# Patient Record
Sex: Male | Born: 1960 | Race: Black or African American | Hispanic: No | Marital: Married | Smoking: Former smoker
Health system: Southern US, Community
[De-identification: ages and names within clinical notes are randomized; demographics above are authoritative.]

## PROBLEM LIST (undated history)

## (undated) DIAGNOSIS — F1910 Other psychoactive substance abuse, uncomplicated: Secondary | ICD-10-CM

## (undated) DIAGNOSIS — B1920 Unspecified viral hepatitis C without hepatic coma: Secondary | ICD-10-CM

## (undated) DIAGNOSIS — I10 Essential (primary) hypertension: Secondary | ICD-10-CM

## (undated) DIAGNOSIS — C801 Malignant (primary) neoplasm, unspecified: Secondary | ICD-10-CM

## (undated) DIAGNOSIS — I712 Thoracic aortic aneurysm, without rupture, unspecified: Secondary | ICD-10-CM

## (undated) DIAGNOSIS — D631 Anemia in chronic kidney disease: Secondary | ICD-10-CM

## (undated) DIAGNOSIS — R778 Other specified abnormalities of plasma proteins: Secondary | ICD-10-CM

## (undated) DIAGNOSIS — I5031 Acute diastolic (congestive) heart failure: Secondary | ICD-10-CM

## (undated) DIAGNOSIS — N189 Chronic kidney disease, unspecified: Secondary | ICD-10-CM

## (undated) DIAGNOSIS — R7989 Other specified abnormal findings of blood chemistry: Secondary | ICD-10-CM

## (undated) DIAGNOSIS — N400 Enlarged prostate without lower urinary tract symptoms: Secondary | ICD-10-CM

## (undated) HISTORY — DX: Benign prostatic hyperplasia without lower urinary tract symptoms: N40.0

## (undated) HISTORY — DX: Anemia in chronic kidney disease: N18.9

## (undated) HISTORY — PX: ABDOMINAL SURGERY: SHX537

## (undated) HISTORY — DX: Other psychoactive substance abuse, uncomplicated: F19.10

## (undated) HISTORY — DX: Anemia in chronic kidney disease: D63.1

---

## 2012-10-16 DIAGNOSIS — C801 Malignant (primary) neoplasm, unspecified: Secondary | ICD-10-CM

## 2012-10-16 HISTORY — PX: COLON SURGERY: SHX602

## 2012-10-16 HISTORY — DX: Malignant (primary) neoplasm, unspecified: C80.1

## 2015-07-05 ENCOUNTER — Emergency Department (HOSPITAL_COMMUNITY): Payer: Self-pay

## 2015-07-05 ENCOUNTER — Encounter (HOSPITAL_COMMUNITY): Payer: Self-pay | Admitting: Emergency Medicine

## 2015-07-05 ENCOUNTER — Ambulatory Visit: Payer: Self-pay

## 2015-07-05 ENCOUNTER — Emergency Department (HOSPITAL_COMMUNITY)
Admission: EM | Admit: 2015-07-05 | Discharge: 2015-07-05 | Discharge status: Against Medical Advice | Payer: Self-pay | Attending: Emergency Medicine | Admitting: Emergency Medicine

## 2015-07-05 DIAGNOSIS — Z720 Tobacco use: Secondary | ICD-10-CM | POA: Insufficient documentation

## 2015-07-05 DIAGNOSIS — K921 Melena: Secondary | ICD-10-CM | POA: Insufficient documentation

## 2015-07-05 DIAGNOSIS — K644 Residual hemorrhoidal skin tags: Secondary | ICD-10-CM | POA: Insufficient documentation

## 2015-07-05 DIAGNOSIS — I10 Essential (primary) hypertension: Secondary | ICD-10-CM | POA: Insufficient documentation

## 2015-07-05 HISTORY — DX: Essential (primary) hypertension: I10

## 2015-07-05 LAB — COMPREHENSIVE METABOLIC PANEL
ALK PHOS: 78 U/L (ref 38–126)
ALT: 67 U/L — ABNORMAL HIGH (ref 17–63)
ANION GAP: 11 (ref 5–15)
AST: 88 U/L — ABNORMAL HIGH (ref 15–41)
Albumin: 3.9 g/dL (ref 3.5–5.0)
BILIRUBIN TOTAL: 1.3 mg/dL — AB (ref 0.3–1.2)
BUN: 19 mg/dL (ref 6–20)
CALCIUM: 9.2 mg/dL (ref 8.9–10.3)
CO2: 25 mmol/L (ref 22–32)
Chloride: 105 mmol/L (ref 101–111)
Creatinine, Ser: 1.68 mg/dL — ABNORMAL HIGH (ref 0.61–1.24)
GFR, EST AFRICAN AMERICAN: 52 mL/min — AB (ref >60)
GFR, EST NON AFRICAN AMERICAN: 45 mL/min — AB (ref >60)
Glucose, Bld: 98 mg/dL (ref 65–99)
Potassium: 3.7 mmol/L (ref 3.5–5.1)
Sodium: 141 mmol/L (ref 135–145)
TOTAL PROTEIN: 7.7 g/dL (ref 6.5–8.1)

## 2015-07-05 LAB — CBC
HCT: 49.1 % (ref 39.0–52.0)
HEMOGLOBIN: 16.9 g/dL (ref 13.0–17.0)
MCH: 30.2 pg (ref 26.0–34.0)
MCHC: 34.4 g/dL (ref 30.0–36.0)
MCV: 87.7 fL (ref 78.0–100.0)
Platelets: 248 10*3/uL (ref 150–400)
RBC: 5.6 MIL/uL (ref 4.22–5.81)
RDW: 14.2 % (ref 11.5–15.5)
WBC: 7 10*3/uL (ref 4.0–10.5)

## 2015-07-05 LAB — TYPE AND SCREEN
ABO/RH(D): B POS
ANTIBODY SCREEN: NEGATIVE

## 2015-07-05 LAB — POC OCCULT BLOOD, ED: FECAL OCCULT BLD: NEGATIVE

## 2015-07-05 LAB — ABO/RH: ABO/RH(D): B POS

## 2015-07-05 MED ORDER — IOHEXOL 300 MG/ML  SOLN
25.0000 mL | Freq: Once | INTRAMUSCULAR | Status: AC | PRN
  Administered 2015-07-05: 25 mL via ORAL

## 2015-07-05 MED ORDER — LABETALOL HCL 5 MG/ML IV SOLN: 10.0000 mg | Freq: Once | INTRAVENOUS | Status: DC

## 2015-07-05 MED ORDER — HYDRALAZINE HCL 20 MG/ML IJ SOLN
10.0000 mg | Freq: Once | INTRAMUSCULAR | Status: AC
  Administered 2015-07-05: 10 mg via INTRAVENOUS
  Filled 2015-07-05: qty 1

## 2015-07-05 MED ORDER — IOHEXOL 300 MG/ML  SOLN
75.0000 mL | Freq: Once | INTRAMUSCULAR | Status: AC | PRN
  Administered 2015-07-05: 75 mL via INTRAVENOUS

## 2015-07-05 MED ORDER — DILTIAZEM HCL ER COATED BEADS 360 MG PO CP24: 360.0000 mg | ORAL_CAPSULE | Freq: Every day | ORAL | Status: DC

## 2015-07-05 NOTE — ED Notes (Signed)
Pt refusing IV at current time, states "my bp isn't going to come down."

## 2015-07-05 NOTE — Discharge Instructions (Signed)
Hemorrhoids °Hemorrhoids are swollen veins around the rectum or anus. There are two types of hemorrhoids:  °· Internal hemorrhoids. These occur in the veins just inside the rectum. They may poke through to the outside and become irritated and painful. °· External hemorrhoids. These occur in the veins outside the anus and can be felt as a painful swelling or hard lump near the anus. °CAUSES °· Pregnancy.   °· Obesity.   °· Constipation or diarrhea.   °· Straining to have a bowel movement.   °· Sitting for long periods on the toilet. °· Heavy lifting or other activity that caused you to strain. °· Anal intercourse. °SYMPTOMS  °· Pain.   °· Anal itching or irritation.   °· Rectal bleeding.   °· Fecal leakage.   °· Anal swelling.   °· One or more lumps around the anus.   °DIAGNOSIS  °Your caregiver may be able to diagnose hemorrhoids by visual examination. Other examinations or tests that may be performed include:  °· Examination of the rectal area with a gloved hand (digital rectal exam).   °· Examination of anal canal using a small tube (scope).   °· A blood test if you have lost a significant amount of blood. °· A test to look inside the colon (sigmoidoscopy or colonoscopy). °TREATMENT °Most hemorrhoids can be treated at home. However, if symptoms do not seem to be getting better or if you have a lot of rectal bleeding, your caregiver may perform a procedure to help make the hemorrhoids get smaller or remove them completely. Possible treatments include:  °· Placing a rubber band at the base of the hemorrhoid to cut off the circulation (rubber band ligation).   °· Injecting a chemical to shrink the hemorrhoid (sclerotherapy).   °· Using a tool to burn the hemorrhoid (infrared light therapy).   °· Surgically removing the hemorrhoid (hemorrhoidectomy).   °· Stapling the hemorrhoid to block blood flow to the tissue (hemorrhoid stapling).   °HOME CARE INSTRUCTIONS  °· Eat foods with fiber, such as whole grains, beans,  nuts, fruits, and vegetables. Ask your doctor about taking products with added fiber in them (fiber supplements). °· Increase fluid intake. Drink enough water and fluids to keep your urine clear or pale yellow.   °· Exercise regularly.   °· Go to the bathroom when you have the urge to have a bowel movement. Do not wait.   °· Avoid straining to have bowel movements.   °· Keep the anal area dry and clean. Use wet toilet paper or moist towelettes after a bowel movement.   °· Medicated creams and suppositories may be used or applied as directed.   °· Only take over-the-counter or prescription medicines as directed by your caregiver.   °· Take warm sitz baths for 15-20 minutes, 3-4 times a day to ease pain and discomfort.   °· Place ice packs on the hemorrhoids if they are tender and swollen. Using ice packs between sitz baths may be helpful.   °¨ Put ice in a plastic bag.   °¨ Place a towel between your skin and the bag.   °¨ Leave the ice on for 15-20 minutes, 3-4 times a day.   °· Do not use a donut-shaped pillow or sit on the toilet for long periods. This increases blood pooling and pain.   °SEEK MEDICAL CARE IF: °· You have increasing pain and swelling that is not controlled by treatment or medicine. °· You have uncontrolled bleeding. °· You have difficulty or you are unable to have a bowel movement. °· You have pain or inflammation outside the area of the hemorrhoids. °MAKE SURE YOU: °· Understand these instructions. °·   Will watch your condition.  Will get help right away if you are not doing well or get worse. Document Released: 09/29/2000 Document Revised: 09/18/2012 Document Reviewed: 08/06/2012 [facility] Patient Information 2015 [place], [place]. This information is not intended to replace advice given to you by your health care provider. Make sure you discuss any questions you have with your health care provider. Hypertension Hypertension, commonly called high blood pressure, is when the force of blood  pumping through your arteries is too strong. Your arteries are the blood vessels that carry blood from your heart throughout your body. A blood pressure reading consists of a higher number over a lower number, such as 110/72. The higher number (systolic) is the pressure inside your arteries when your heart pumps. The lower number (diastolic) is the pressure inside your arteries when your heart relaxes. Ideally you want your blood pressure below 120/80. Hypertension forces your heart to work harder to pump blood. Your arteries may become narrow or stiff. Having hypertension puts you at risk for heart disease, stroke, and other problems.  RISK FACTORS Some risk factors for high blood pressure are controllable. Others are not.  Risk factors you cannot control include:   Race. You may be at higher risk if you are African American.  Age. Risk increases with age.  Gender. Men are at higher risk than women before age 22 years. After age 79, women are at higher risk than men. Risk factors you can control include:  Not getting enough exercise or physical activity.  Being overweight.  Getting too much fat, sugar, calories, or salt in your diet.  Drinking too much alcohol. SIGNS AND SYMPTOMS Hypertension does not usually cause signs or symptoms. Extremely high blood pressure (hypertensive crisis) may cause headache, anxiety, shortness of breath, and nosebleed. DIAGNOSIS  To check if you have hypertension, your health care provider will measure your blood pressure while you are seated, with your arm held at the level of your heart. It should be measured at least twice using the same arm. Certain conditions can cause a difference in blood pressure between your right and left arms. A blood pressure reading that is higher than normal on one occasion does not mean that you need treatment. If one blood pressure reading is high, ask your health care provider about having it checked again. TREATMENT  Treating  high blood pressure includes making lifestyle changes and possibly taking medicine. Living a healthy lifestyle can help lower high blood pressure. You may need to change some of your habits. Lifestyle changes may include:  Following the DASH diet. This diet is high in fruits, vegetables, and whole grains. It is low in salt, red meat, and added sugars.  Getting at least 2 hours of brisk physical activity every week.  Losing weight if necessary.  Not smoking.  Limiting alcoholic beverages.  Learning ways to reduce stress. If lifestyle changes are not enough to get your blood pressure under control, your health care provider may prescribe medicine. You may need to take more than one. Work closely with your health care provider to understand the risks and benefits. HOME CARE INSTRUCTIONS  Have your blood pressure rechecked as directed by your health care provider.   Take medicines only as directed by your health care provider. Follow the directions carefully. Blood pressure medicines must be taken as prescribed. The medicine does not work as well when you skip doses. Skipping doses also puts you at risk for problems.   Do not smoke.  Monitor your blood pressure at home as directed by your health care provider. SEEK MEDICAL CARE IF:   You think you are having a reaction to medicines taken.  You have recurrent headaches or feel dizzy.  You have swelling in your ankles.  You have trouble with your vision. SEEK IMMEDIATE MEDICAL CARE IF:  You develop a severe headache or confusion.  You have unusual weakness, numbness, or feel faint.  You have severe chest or abdominal pain.  You vomit repeatedly.  You have trouble breathing. MAKE SURE YOU:   Understand these instructions.  Will watch your condition.  Will get help right away if you are not doing well or get worse. Document Released: 10/02/2005 Document Revised: 02/16/2014 Document Reviewed: 07/25/2013 [facility]  Patient Information 2015 [place], [place]. This information is not intended to replace advice given to you by your health care provider. Make sure you discuss any questions you have with your health care provider.

## 2015-07-05 NOTE — ED Notes (Signed)
Pt reports taking 4-5 Advil yesterday at one time.

## 2015-07-05 NOTE — ED Provider Notes (Signed)
CSN: [id number]     Arrival date & time 07/05/15  1004 History   First MD Initiated Contact with Patient 07/05/15 1452     Chief Complaint  Patient presents with  . Hypertension  . Rectal Bleeding     (Consider location/radiation/quality/duration/timing/severity/associated sxs/prior Treatment) Patient is a 54 y.o. male presenting with hematochezia.  Rectal Bleeding Quality: dark red. Amount:  Moderate Duration:  2 weeks Timing:  Intermittent Progression:  Unchanged Chronicity:  Recurrent Context: not diarrhea, not rectal injury and not rectal pain   Context comment:  Prior colon ca sp resection Similar prior episodes: yes   Relieved by:  Nothing Worsened by:  Nothing tried Associated symptoms: no abdominal pain, no epistaxis, no fever, no loss of consciousness and no vomiting   Risk factors: liver disease (Hep A, Hep B)   Risk factors: no NSAID use     Past Medical History  Diagnosis Date  . Hypertension    Past Surgical History  Procedure Laterality Date  . Abdominal surgery     No family history on file. Social History  Substance Use Topics  . Smoking status: Current Every Day Smoker    Types: Cigarettes  . Smokeless tobacco: None  . Alcohol Use: No    Review of Systems  Constitutional: Negative for fever.  HENT: Negative for nosebleeds.   Gastrointestinal: Positive for hematochezia. Negative for vomiting and abdominal pain.  Neurological: Negative for loss of consciousness.  All other systems reviewed and are negative.     Allergies  Review of patient's allergies indicates no known allergies.  Home Medications   Prior to Admission medications   Medication Sig Start Date End Date Taking? Authorizing Provider  diltiazem (CARDIZEM CD) 360 MG 24 hr capsule Take 1 capsule (360 mg total) by mouth daily. 07/05/15   Debby Freiberg, MD  ibuprofen (ADVIL,MOTRIN) 200 MG tablet Take 800 mg by mouth every 6 (six) hours as needed for headache.   Yes Historical  Provider, MD   BP 198/118 mmHg  Pulse 63  Temp(Src) 98.9 F (37.2 C) (Oral)  Resp 20  Ht 6\' 1"  (1.854 m)  Wt 208 lb 9.6 oz (94.62 kg)  BMI 27.53 kg/m2  SpO2 99% Physical Exam  Constitutional: He is oriented to person, place, and time. He appears well-developed and well-nourished.  HENT:  Head: Normocephalic and atraumatic.  Eyes: Conjunctivae and EOM are normal.  Neck: Normal range of motion. Neck supple.  Cardiovascular: Normal rate, regular rhythm and normal heart sounds.   Pulmonary/Chest: Effort normal and breath sounds normal. No respiratory distress.  Abdominal: He exhibits no distension. There is tenderness in the right lower quadrant and left lower quadrant. There is no rebound and no guarding.  Genitourinary:  Ruptured hemorrhoid, no gross blood on rectal exam  Musculoskeletal: Normal range of motion.  Neurological: He is alert and oriented to person, place, and time.  Skin: Skin is warm and dry.  Vitals reviewed.   ED Course  Procedures (including critical care time) Labs Review Labs Reviewed  COMPREHENSIVE METABOLIC PANEL - Abnormal; Notable for the following:    Creatinine, Ser 1.68 (*)    AST 88 (*)    ALT 67 (*)    Total Bilirubin 1.3 (*)    GFR calc non Af Amer 45 (*)    GFR calc Af Amer 52 (*)    All other components within normal limits  CBC  POC OCCULT BLOOD, ED  TYPE AND SCREEN  ABO/RH    Imaging Review  Ct Abdomen Pelvis W Contrast  07/05/2015   CLINICAL DATA:  54 year old male with abdominal pelvic pain for 2 months and will blood in stools. History of colon cancer and surgery.  EXAM: CT ABDOMEN AND PELVIS WITH CONTRAST  TECHNIQUE: Multidetector CT imaging of the abdomen and pelvis was performed using the standard protocol following bolus administration of intravenous contrast.  CONTRAST:  54mL OMNIPAQUE IOHEXOL 300 MG/ML  SOLN  COMPARISON:  None.  FINDINGS: Lower chest:  No acute abnormalities.  Hepatobiliary: The liver and gallbladder are  unremarkable. There is no evidence of biliary dilatation.  Pancreas: Unremarkable  Spleen: Unremarkable  Adrenals/Urinary Tract: The kidneys, adrenal glands and bladder are unremarkable.  Stomach/Bowel: Bowel surgical changes within the right abdomen noted. There is no evidence of bowel obstruction, definite bowel wall thickening or mass.  Vascular/Lymphatic: No enlarged lymph nodes or abdominal aortic aneurysm.  Reproductive: Mild prostate enlargement noted.  Other: No free fluid, abscess or pneumoperitoneum.  Musculoskeletal: No acute or suspicious abnormalities identified.  IMPRESSION: No evidence of acute abnormality. No identifiable cause for this patient's blood per rectum on this study.   Electronically Signed   By: Margarette Canada M.D.   On: 07/05/2015 19:03   I have personally reviewed and evaluated these images and lab results as part of my medical decision-making.   EKG Interpretation None      MDM   Final diagnoses:  Hematochezia  Essential hypertension  External hemorrhoid    54 y.o. male with pertinent PMH of colon ca sp resection presents with painless rectal bleeding, as well as concern for HTN.  Pt states symptoms are identical to prior colon cancer.  Physical exam as above with bil lq abd tenderness, ruptured external hemorrhoid (likely source of bleeding).  No neuro deficits by history or exam.  Elba Barman as above with elevated cr, stable hgb, no blood on rectal exam or hemoccult.  CT scan unremarkable.  Pt informed that we would need to control bp prior to dc and consider admission but he insisted on leaving immediately.  Likely etiology of htn is that pt is noncompliant for the last month.  We discussed the risks of him leaving now, including organ dysfunction (specifically disability including dialysis), and death, and pt continued to refuse after reiterating risks.  Feel he is competent to make decisions.  DC AMA with dilt prescription, pcp fu.  I have reviewed all laboratory and  imaging studies if ordered as above  1. Hematochezia   2. Essential hypertension   3. External hemorrhoid         Debby Freiberg, MD 07/05/15 [phone]

## 2015-07-05 NOTE — Care Management (Signed)
ED CM noted no PCP or health insurance list in record. CM patient at bedside confirmed information. Discussed the importance and benefits of primary care,  also discussed accessing care in the appropriate setting,  patient verbalized understanding and appreciation for information.  Discussed the [facility] and the [facility] along with  the services rendered, offered to schedule appointment, patient amendable with plan. F/U appointment scheduled for 9/29 at 3p at the [facility]. Patient is agreeable with diischarge plan. Updated Dr. Colin Rhein on disposition plan. No further ED CM needs identified.

## 2015-07-05 NOTE — ED Notes (Signed)
Pt from home for eval of HTN and dark red blood in stool for 2 episodes, pt reports lower abd pain x1 month. States had same symptoms in 2014 and has "some operation done where the cut cancer out". Pt denies any  N/v. axox 4.

## 2015-07-12 ENCOUNTER — Ambulatory Visit: Payer: Self-pay | Attending: Internal Medicine

## 2015-07-13 ENCOUNTER — Emergency Department (INDEPENDENT_AMBULATORY_CARE_PROVIDER_SITE_OTHER)
Admission: EM | Admit: 2015-07-13 | Discharge: 2015-07-13 | Disposition: A | Discharge status: Home / Self Care | Payer: Self-pay | Source: Home / Self Care | Attending: Emergency Medicine | Admitting: Emergency Medicine

## 2015-07-13 ENCOUNTER — Encounter (HOSPITAL_COMMUNITY): Payer: Self-pay | Admitting: Emergency Medicine

## 2015-07-13 DIAGNOSIS — G44209 Tension-type headache, unspecified, not intractable: Secondary | ICD-10-CM

## 2015-07-13 DIAGNOSIS — I10 Essential (primary) hypertension: Secondary | ICD-10-CM

## 2015-07-13 HISTORY — DX: Malignant (primary) neoplasm, unspecified: C80.1

## 2015-07-13 MED ORDER — CYCLOBENZAPRINE HCL 10 MG PO TABS: 10.0000 mg | ORAL_TABLET | Freq: Three times a day (TID) | ORAL | Status: DC | PRN

## 2015-07-13 NOTE — Discharge Instructions (Signed)
Your headache is coming from muscle tightness in your left neck.  Take ibuprofen 800 mg 3 times a day as needed. Use the Flexeril up to 3 times a day as needed. This medicine will make you drowsy. Apply heat to the left neck.  Your blood pressure is elevated, but not horrible. Make sure you follow-up at the [facility] clinic as scheduled in 2 days.

## 2015-07-13 NOTE — ED Provider Notes (Signed)
CSN: [id number]     Arrival date & time 07/13/15  1938 History   First MD Initiated Contact with Patient 07/13/15 2002     Chief Complaint  Patient presents with  . Headache  . Hypertension  . Dizziness   (Consider location/radiation/quality/duration/timing/severity/associated sxs/prior Treatment) HPI He is a 54 year old man here for evaluation of hypertension. He was recently diagnosed with hypertension in the emergency room. He was started on diltiazem. He states he has been compliant with the medications. Today, he developed a left occipital headache. He also states his vision was slightly blurry and he felt a little dizzy. He denies any current dizziness or vision changes. No chest pain. No shortness of breath. No palpitations or leg swelling. He has an appointment at the [facility] to establish care on Thursday.  Past Medical History  Diagnosis Date  . Hypertension   . Cancer    Past Surgical History  Procedure Laterality Date  . Abdominal surgery    . Colon surgery     History reviewed. No pertinent family history. Social History  Substance Use Topics  . Smoking status: Current Every Day Smoker -- 0.25 packs/day    Types: Cigarettes  . Smokeless tobacco: None  . Alcohol Use: 4.2 oz/week    7 Cans of beer per week     Comment: drinks daily 1-2 beers    Review of Systems As in history of present illness Allergies  Review of patient's allergies indicates no known allergies.  Home Medications   Prior to Admission medications   Medication Sig Start Date End Date Taking? Authorizing Provider  diltiazem (CARDIZEM CD) 360 MG 24 hr capsule Take 1 capsule (360 mg total) by mouth daily. 07/05/15  Yes Debby Freiberg, MD  ibuprofen (ADVIL,MOTRIN) 200 MG tablet Take 800 mg by mouth every 6 (six) hours as needed for headache.   Yes Historical Provider, MD  cyclobenzaprine (FLEXERIL) 10 MG tablet Take 1 tablet (10 mg total) by mouth 3 (three) times daily as needed for muscle  spasms. 07/13/15   Melony Overly, MD   Meds Ordered and Administered this Visit  Medications - No data to display  BP 160/96 mmHg  Pulse 58  Temp(Src) 98.2 F (36.8 C) (Oral)  Resp 20  SpO2 95% No data found.   Physical Exam  Constitutional: He is oriented to person, place, and time. He appears well-developed and well-nourished. No distress.  Neck: Neck supple.  Range of motion. He is tender at the left occiput. There is muscle spasm in the left trapezius.  Cardiovascular: Normal rate, regular rhythm and normal heart sounds.   No murmur heard. Pulmonary/Chest: Effort normal and breath sounds normal. No respiratory distress. He has no wheezes. He has no rales.  Neurological: He is alert and oriented to person, place, and time.    ED Course  Procedures (including critical care time)  Labs Review Labs Reviewed - No data to display  Imaging Review No results found.    MDM   1. Essential hypertension   2. Muscle tension headache    Blood pressure is elevated but not severely elevated. Headache is due to muscle tension. We'll treat with ibuprofen and Flexeril. Recommended heat. He will follow-up at the [facility] cell clinic as scheduled on Thursday.    Melony Overly, MD 07/13/15 2030

## 2015-07-13 NOTE — ED Notes (Signed)
Pt states he has been feeling very light-headed at night and has been suffering from a throbbing pain in the back of his head and neck.  He feels his blood pressure is not being regulated well.  He has not taken his pressure at home.

## 2015-07-15 ENCOUNTER — Ambulatory Visit (INDEPENDENT_AMBULATORY_CARE_PROVIDER_SITE_OTHER): Payer: No Typology Code available for payment source | Admitting: Family Medicine

## 2015-07-15 ENCOUNTER — Encounter: Payer: Self-pay | Admitting: Family Medicine

## 2015-07-15 VITALS — BP 156/108 | HR 69 | Temp 98.0°F | Resp 16 | Ht 73.0 in | Wt 211.0 lb

## 2015-07-15 DIAGNOSIS — Z7689 Persons encountering health services in other specified circumstances: Secondary | ICD-10-CM

## 2015-07-15 DIAGNOSIS — N400 Enlarged prostate without lower urinary tract symptoms: Secondary | ICD-10-CM

## 2015-07-15 DIAGNOSIS — Z7189 Other specified counseling: Secondary | ICD-10-CM

## 2015-07-15 DIAGNOSIS — I10 Essential (primary) hypertension: Secondary | ICD-10-CM

## 2015-07-15 MED ORDER — TAMSULOSIN HCL 0.4 MG PO CAPS: 0.4000 mg | ORAL_CAPSULE | Freq: Every day | ORAL | Status: DC

## 2015-07-15 MED ORDER — CLONIDINE HCL 0.1 MG PO TABS
0.1000 mg | ORAL_TABLET | Freq: Once | ORAL | Status: AC
  Administered 2015-07-15: 0.1 mg via ORAL

## 2015-07-15 NOTE — Progress Notes (Signed)
Patient ID: Timothy [name], male   DOB: 1961/08/30, 54 y.o.   MRN: [id number]   Timothy [name], is a 54 y.o. male  [id number]  [id number]  DOB - 11/16/1960  CC:  Chief Complaint  Patient presents with  . Establish Care    prostate swollen? Trouble urinating. Wants dental referral        HPI: Timothy [name] is a 54 y.o. male here to establish care. He has been seen recently in ED for hypertension and after being started on medication, at Urgent Care for headache. He complains of "problems with his prostate". He reports having difficulty starting and reports a slow stream. He has had recent bloodwork showing a decrease in renal function. He was started on Cardizem CD in the ED but has not had that today. His blood pressure today with no medication is 164/115. He reportd having a prostate exam recently showing an enlarged prostate. He also is having ED. He would like a referral to urology. He needs some bloodwork but will have him return in one month to repeat previous labs, plus lipids and PSA. He reports smoking about 3 cigarettes a day, drinking about a bottle of liquor a week and occasionally smoke marijuana.  No Known Allergies Past Medical History  Diagnosis Date  . Hypertension   . Cancer    Current Outpatient Prescriptions on File Prior to Visit  Medication Sig Dispense Refill  . diltiazem (CARDIZEM CD) 360 MG 24 hr capsule Take 1 capsule (360 mg total) by mouth daily. 15 capsule 0  . ibuprofen (ADVIL,MOTRIN) 200 MG tablet Take 800 mg by mouth every 6 (six) hours as needed for headache.    . cyclobenzaprine (FLEXERIL) 10 MG tablet Take 1 tablet (10 mg total) by mouth 3 (three) times daily as needed for muscle spasms. (Patient not taking: Reported on 07/15/2015) 30 tablet 0   No current facility-administered medications on file prior to visit.   Family History  Problem Relation Age of Onset  . Heart disease Mother    Social History   Social History  . Marital  Status: Married    Spouse Name: N/A  . Number of Children: N/A  . Years of Education: N/A   Occupational History  . Not on file.   Social History Main Topics  . Smoking status: Current Every Day Smoker -- 0.25 packs/day    Types: Cigarettes  . Smokeless tobacco: Not on file  . Alcohol Use: 4.2 oz/week    7 Cans of beer per week     Comment: drinks daily 1-2 beers  . Drug Use: 1.00 per week    Special: Marijuana  . Sexual Activity: Not on file     Comment: once a week   Other Topics Concern  . Not on file   Social History Narrative    Review of Systems: Constitutional: Negative for fever, chills, appetite change, weight loss,  Fatigue. Skin:  He reports an intermittent rash on his scalp HENT: Negative for ear pain, ear discharge.nose bleeds Eyes: Negative for pain, discharge, redness, itching and visual disturbance.Wears glasses for vision Neck: Negative for pain, stiffness Respiratory: Negative for cough, shortness of breath, except with exertion. Cardiovascular: Negative for chest pain, palpitations and leg swelling. Gastrointestinal: Negative for abdominal pain, nausea, vomiting, diarrhea. Reports constipation and hemorroids that occasionally bleed.  Genitourinary: Negative for dysuria, urgency, frequency, hematuria,  Musculoskeletal: Negative  joint pain, joint  swelling, and gait problem.Negative for weakness.Positive for backache. Neurological: Negative for dizziness, tremors, seizures, syncope,  light-headedness, numbness and headaches.  Hematological: Negative for easy bruising or bleeding Psychiatric/Behavioral: Negative for depression, anxiety, decreased concentration, confusion. Reports occassional "slump in mood"   Objective:   Filed Vitals:   07/15/15 1522  BP: 156/108  Pulse:   Temp:   Resp:     Physical Exam: Constitutional: Patient appears well-developed and well-nourished. No distress. HENT: Normocephalic, atraumatic, External right and left ear  normal. Oropharynx is clear and moist.  Eyes: Conjunctivae and EOM are normal. PERRLA, no scleral icterus. Neck: Normal ROM. Neck supple. No lymphadenopathy, No thyromegaly. CVS: RRR, S1/S2 +, no murmurs, no gallops, no rubs Pulmonary: Effort and breath sounds normal, no stridor, rhonchi, wheezes, rales.  Abdominal: Soft. Normoactive BS,, no distension, tenderness, rebound or guarding.  Musculoskeletal: Normal range of motion. No edema and no tenderness.  Neuro: Alert.Normal muscle tone coordination. Non-focal Skin: Skin is warm and dry. No rash noted. Not diaphoretic. No erythema. No pallor. Psychiatric: Normal mood and affect. Behavior, judgment, thought content normal.  Lab Results  Component Value Date   WBC 7.0 07/05/2015   HGB 16.9 07/05/2015   HCT 49.1 07/05/2015   MCV 87.7 07/05/2015   PLT 248 07/05/2015   Lab Results  Component Value Date   CREATININE 1.68* 07/05/2015   BUN 19 07/05/2015   NA 141 07/05/2015   K 3.7 07/05/2015   CL 105 07/05/2015   CO2 25 07/05/2015    No results found for: HGBA1C Lipid Panel  No results found for: CHOL, TRIG, HDL, CHOLHDL, VLDL, LDLCALC     Assessment and plan:   1. Essential hypertension (164/115)  - cloNIDine (CATAPRES) tablet 0.1 mg; Take 1 tablet (0.1 mg total) by mouth once. - Instructed him to take his cardizem CD 360 daily and to come back in one week for nurse visit for BP check\ -Return to see me in one months for repeat of previous bloodwork and lipids and PSA  2. Prostate issues -Flomax .4 one po q day, #30 with 11 refills -referral to urology.  3. Health maintenance -Will discuss further health maintenance needs in one month.   Return in about 4 weeks (around 08/12/2015).  The patient was given clear instructions to go to ER or return to medical center if symptoms don't improve, worsen or new problems develop. The patient verbalized understanding.    Micheline Chapman FNP  07/16/2015, 12:28 PM

## 2015-07-15 NOTE — Patient Instructions (Signed)
1. Be sure to take your blood pressure medication daily and come back in one week for nurse visit to have BP rechecked 2. Start Flomax for prostate. This will not help the erectile dysfunction. Will try to get a urology referral after we get your BP under control. 3.  DASH diet, regular exerciseDASH Eating Plan DASH stands for "Dietary Approaches to Stop Hypertension." The DASH eating plan is a healthy eating plan that has been shown to reduce high blood pressure (hypertension). Additional health benefits may include reducing the risk of type 2 diabetes mellitus, heart disease, and stroke. The DASH eating plan may also help with weight loss. WHAT DO I NEED TO KNOW ABOUT THE DASH EATING PLAN? For the DASH eating plan, you will follow these general guidelines:  Choose foods with a percent daily value for sodium of less than 5% (as listed on the food label).  Use salt-free seasonings or herbs instead of table salt or sea salt.  Check with your health care provider or pharmacist before using salt substitutes.  Eat lower-sodium products, often labeled as "lower sodium" or "no salt added."  Eat fresh foods.  Eat more vegetables, fruits, and low-fat dairy products.  Choose whole grains. Look for the word "whole" as the first word in the ingredient list.  Choose fish and skinless chicken or [place] more often than red meat. Limit fish, poultry, and meat to 6 oz (170 g) each day.  Limit sweets, desserts, sugars, and sugary drinks.  Choose heart-healthy fats.  Limit cheese to 1 oz (28 g) per day.  Eat more home-cooked food and less restaurant, buffet, and fast food.  Limit fried foods.  Cook foods using methods other than frying.  Limit canned vegetables. If you do use them, rinse them well to decrease the sodium.  When eating at a restaurant, ask that your food be prepared with less salt, or no salt if possible. WHAT FOODS CAN I EAT? Seek help from a dietitian for individual calorie  needs. Grains Whole grain or whole wheat bread. Brown rice. Whole grain or whole wheat pasta. Quinoa, bulgur, and whole grain cereals. Low-sodium cereals. Corn or whole wheat flour tortillas. Whole grain cornbread. Whole grain crackers. Low-sodium crackers. Vegetables Fresh or frozen vegetables (raw, steamed, roasted, or grilled). Low-sodium or reduced-sodium tomato and vegetable juices. Low-sodium or reduced-sodium tomato sauce and paste. Low-sodium or reduced-sodium canned vegetables.  Fruits All fresh, canned (in natural juice), or frozen fruits. Meat and Other Protein Products Ground beef (85% or leaner), grass-fed beef, or beef trimmed of fat. Skinless chicken or [place]. Ground chicken or [place]. Pork trimmed of fat. All fish and seafood. Eggs. Dried beans, peas, or lentils. Unsalted nuts and seeds. Unsalted canned beans. Dairy Low-fat dairy products, such as skim or 1% milk, 2% or reduced-fat cheeses, low-fat ricotta or cottage cheese, or plain low-fat yogurt. Low-sodium or reduced-sodium cheeses. Fats and Oils Tub margarines without trans fats. Light or reduced-fat mayonnaise and salad dressings (reduced sodium). Avocado. Safflower, olive, or canola oils. Natural peanut or almond butter. Other Unsalted popcorn and pretzels. The items listed above may not be a complete list of recommended foods or beverages. Contact your dietitian for more options. WHAT FOODS ARE NOT RECOMMENDED? Grains White bread. White pasta. White rice. Refined cornbread. Bagels and croissants. Crackers that contain trans fat. Vegetables Creamed or fried vegetables. Vegetables in a cheese sauce. Regular canned vegetables. Regular canned tomato sauce and paste. Regular tomato and vegetable juices. Fruits Dried fruits. Canned fruit in  light or heavy syrup. Fruit juice. Meat and Other Protein Products Fatty cuts of meat. Ribs, chicken wings, bacon, sausage, bologna, salami, chitterlings, fatback, hot dogs, bratwurst,  and packaged luncheon meats. Salted nuts and seeds. Canned beans with salt. Dairy Whole or 2% milk, cream, half-and-half, and cream cheese. Whole-fat or sweetened yogurt. Full-fat cheeses or blue cheese. Nondairy creamers and whipped toppings. Processed cheese, cheese spreads, or cheese curds. Condiments Onion and garlic salt, seasoned salt, table salt, and sea salt. Canned and packaged gravies. Worcestershire sauce. Tartar sauce. Barbecue sauce. Teriyaki sauce. Soy sauce, including reduced sodium. Steak sauce. Fish sauce. Oyster sauce. Cocktail sauce. Horseradish. Ketchup and mustard. Meat flavorings and tenderizers. Bouillon cubes. Hot sauce. Tabasco sauce. Marinades. Taco seasonings. Relishes. Fats and Oils Butter, stick margarine, lard, shortening, ghee, and bacon fat. Coconut, palm kernel, or palm oils. Regular salad dressings. Other Pickles and olives. Salted popcorn and pretzels. The items listed above may not be a complete list of foods and beverages to avoid. Contact your dietitian for more information. WHERE CAN I FIND MORE INFORMATION? National Heart, Lung, and Blood Institute: travelstabloid.com Document Released: 09/21/2011 Document Revised: 02/16/2014 Document Reviewed: 08/06/2013 [facility] Patient Information 2015 [place], [place]. This information is not intended to replace advice given to you by your health care provider. Make sure you discuss any questions you have with your health care provider.

## 2015-07-16 DIAGNOSIS — N400 Enlarged prostate without lower urinary tract symptoms: Secondary | ICD-10-CM | POA: Insufficient documentation

## 2015-07-29 ENCOUNTER — Ambulatory Visit (INDEPENDENT_AMBULATORY_CARE_PROVIDER_SITE_OTHER): Payer: No Typology Code available for payment source | Admitting: Family Medicine

## 2015-07-29 ENCOUNTER — Encounter: Payer: Self-pay | Admitting: Family Medicine

## 2015-07-29 VITALS — BP 186/108 | HR 61 | Temp 97.9°F | Ht 73.0 in | Wt 206.0 lb

## 2015-07-29 DIAGNOSIS — R3911 Hesitancy of micturition: Secondary | ICD-10-CM

## 2015-07-29 DIAGNOSIS — K029 Dental caries, unspecified: Secondary | ICD-10-CM

## 2015-07-29 DIAGNOSIS — I10 Essential (primary) hypertension: Secondary | ICD-10-CM

## 2015-07-29 LAB — COMPLETE METABOLIC PANEL WITH GFR
ALBUMIN: 4.2 g/dL (ref 3.6–5.1)
ALT: 39 U/L (ref 9–46)
AST: 39 U/L — AB (ref 10–35)
Alkaline Phosphatase: 85 U/L (ref 40–115)
BUN: 19 mg/dL (ref 7–25)
CO2: 25 mmol/L (ref 20–31)
Calcium: 9.1 mg/dL (ref 8.6–10.3)
Chloride: 102 mmol/L (ref 98–110)
Creat: 1.59 mg/dL — ABNORMAL HIGH (ref 0.70–1.33)
GFR, EST NON AFRICAN AMERICAN: 48 mL/min — AB (ref >=60)
GFR, Est African American: 56 mL/min — ABNORMAL LOW (ref >=60)
GLUCOSE: 109 mg/dL — AB (ref 65–99)
POTASSIUM: 4.1 mmol/L (ref 3.5–5.3)
Sodium: 137 mmol/L (ref 135–146)
TOTAL PROTEIN: 7.6 g/dL (ref 6.1–8.1)
Total Bilirubin: 0.6 mg/dL (ref 0.2–1.2)

## 2015-07-29 LAB — LIPID PANEL
CHOL/HDL RATIO: 4.7 ratio (ref <=5.0)
CHOLESTEROL: 242 mg/dL — AB (ref 125–200)
HDL: 51 mg/dL (ref >=40)
LDL CALC: 157 mg/dL — AB (ref <130)
Triglycerides: 171 mg/dL — ABNORMAL HIGH (ref <150)
VLDL: 34 mg/dL — AB (ref <30)

## 2015-07-29 LAB — CBC WITH DIFFERENTIAL/PLATELET
Basophils Absolute: 0 10*3/uL (ref 0.0–0.1)
Basophils Relative: 0 % (ref 0–1)
Eosinophils Absolute: 0.1 10*3/uL (ref 0.0–0.7)
Eosinophils Relative: 1 % (ref 0–5)
HEMATOCRIT: 47.7 % (ref 39.0–52.0)
HEMOGLOBIN: 16.2 g/dL (ref 13.0–17.0)
LYMPHS ABS: 1.9 10*3/uL (ref 0.7–4.0)
LYMPHS PCT: 30 % (ref 12–46)
MCH: 29.6 pg (ref 26.0–34.0)
MCHC: 34 g/dL (ref 30.0–36.0)
MCV: 87.2 fL (ref 78.0–100.0)
MONO ABS: 0.8 10*3/uL (ref 0.1–1.0)
MONOS PCT: 13 % — AB (ref 3–12)
MPV: 9.9 fL (ref 8.6–12.4)
NEUTROS ABS: 3.5 10*3/uL (ref 1.7–7.7)
NEUTROS PCT: 56 % (ref 43–77)
Platelets: 300 10*3/uL (ref 150–400)
RBC: 5.47 MIL/uL (ref 4.22–5.81)
RDW: 14.9 % (ref 11.5–15.5)
WBC: 6.3 10*3/uL (ref 4.0–10.5)

## 2015-07-29 MED ORDER — CLONIDINE HCL 0.1 MG PO TABS
0.1000 mg | ORAL_TABLET | Freq: Once | ORAL | Status: AC
  Administered 2015-07-29: 0.1 mg via ORAL

## 2015-07-29 MED ORDER — DILTIAZEM HCL ER COATED BEADS 360 MG PO CP24: 360.0000 mg | ORAL_CAPSULE | Freq: Every day | ORAL | Status: DC

## 2015-07-29 MED ORDER — CLONIDINE HCL 0.1 MG PO TABS
0.1000 mg | ORAL_TABLET | Freq: Once | ORAL | Status: AC
Start: 2015-07-29 — End: 2015-07-29
  Administered 2015-07-29: 0.1 mg via ORAL

## 2015-07-29 NOTE — Patient Instructions (Signed)
Is is urgent that you get on your bp medication regularly and to return in one week for a recheck of your BP by the nurse Make appointment and come back and see me in 4 weeks. I have put in a referral to urology and to denist You can use over the counter stool softener if needed for constipation You can use over the counter dandruff shampoo for scalp Avoid salt in diet to help with blood pressure.

## 2015-07-29 NOTE — Progress Notes (Signed)
Patient ID: Timothy [name], male   DOB: 1960-11-12, 54 y.o.   MRN: [id number]   Timothy [name], is a 54 y.o. male  [id number]  [id number]  DOB - 03/13/1961  CC:  Chief Complaint  Patient presents with  . Follow-up    HTN F/U, BPH       HPI: Timothy [name] is a 54 y.o. male here for follow-up on hypertension and urinary hesitancy and for repeat bloodwork to assess renal   Funcition. He was seen about a month ago in ED and labs showed elevated creatinine and decreased GFR. He has hypertension. He had been prescribed Cardizem CD 360 but was not taking regularly when I saw him. I discussed the importance of getting on and staying on medication for hypertension.He was to come back in one week for a BP check but bid not follow-up as directed. He only had 15 Cardizem as I was wanting to see how well it was controlling his BP before prescribing more.  He has run out of cardizem and had not had medication today. He did not pick up his Flomax so is continuing to have urinary hesitancy.   No Known Allergies Past Medical History  Diagnosis Date  . Hypertension   . Cancer ([place])   . BPH (benign prostatic hyperplasia)    Current Outpatient Prescriptions on File Prior to Visit  Medication Sig Dispense Refill  . cyclobenzaprine (FLEXERIL) 10 MG tablet Take 1 tablet (10 mg total) by mouth 3 (three) times daily as needed for muscle spasms. (Patient not taking: Reported on 07/15/2015) 30 tablet 0  . ibuprofen (ADVIL,MOTRIN) 200 MG tablet Take 800 mg by mouth every 6 (six) hours as needed for headache.    . tamsulosin (FLOMAX) 0.4 MG CAPS capsule Take 1 capsule (0.4 mg total) by mouth daily. (Patient not taking: Reported on 07/29/2015) 30 capsule 3   No current facility-administered medications on file prior to visit.   Family History  Problem Relation Age of Onset  . Heart disease Mother    Social History   Social History  . Marital Status: Married    Spouse Name: N/A  . Number of  Children: N/A  . Years of Education: N/A   Occupational History  . Not on file.   Social History Main Topics  . Smoking status: Current Every Day Smoker -- 0.25 packs/day    Types: Cigarettes  . Smokeless tobacco: Not on file  . Alcohol Use: 4.2 oz/week    7 Cans of beer per week     Comment: drinks daily 1-2 beers  . Drug Use: 1.00 per week    Special: Marijuana  . Sexual Activity: Not on file     Comment: once a week   Other Topics Concern  . Not on file   Social History Narrative    Review of Systems: Constitutional: Negative  HENT: Negative Positive for scalp rash Eyes: Negative  Neck: Negative  Respiratory: Negative  Cardiovascular: Negative  Gastrointestinal: Negative  Genitourinary: Positive for for urinary hesitancy. Musculoskeletal: Negative Neurological: Negative  numbness Positive for up and down mood.    Objective:   Filed Vitals:   07/29/15 0856  BP: 179/118  Pulse: 69  Temp: 97.9 F (36.6 C)    Physical Exam: Constitutional: Patient appears well-developed and well-nourished. No distress. HENT: Normocephalic, atraumatic, External right and left ear normal. Oropharynx is clear and moist.  Eyes: Conjunctivae and EOM are normal. PERRLA, no scleral icterus. Neck: Normal ROM. Neck supple. No lymphadenopathy, No  thyromegaly. CVS: RRR, S1/S2 +, no murmurs, no gallops, no rubs   Pulmonary: Effort and breath sounds normal, no stridor, rhonchi, wheezes, rales.  Abdominal: Soft. Normoactive BS,, no distension, tenderness, rebound or guarding.  Musculoskeletal: Normal range of motion. No edema and no tenderness.  BP: 179/118, Repeat after 0.1 clonidine 186/108. After 2nd clonidine 0.1 mg down to 164/89 Lab Results  Component Value Date   WBC 7.0 07/05/2015   HGB 16.9 07/05/2015   HCT 49.1 07/05/2015   MCV 87.7 07/05/2015   PLT 248 07/05/2015   Lab Results  Component Value Date   CREATININE 1.68* 07/05/2015   BUN 19 07/05/2015   NA 141 07/05/2015    K 3.7 07/05/2015   CL 105 07/05/2015   CO2 25 07/05/2015    No results found for: HGBA1C Lipid Panel  No results found for: CHOL, TRIG, HDL, CHOLHDL, VLDL, LDLCALC     Assessment and plan:   1. Essential hypertension, uncontrolled   179/118 - Clonidine 0.1 mg here in office.9:30 - Clonidine 0.1 mg  Repeated in office at 10.00  - COMPLETE METABOLIC PANEL WITH GFR - CBC with Differential - Lipid panel - I have once again review the possible complications of uncontrolled hypertension and discussed the importance of getting on and staying on medication and the importance of following up in a week for a recheck.  2. Urinary hesitancy  - Ambulatory referral to Urology - PSA  3. Dental caries  - Ambulatory referral to Dentistry   No Follow-up on file.  The patient was given clear instructions to go to ER or return to medical center if symptoms don't improve, worsen or new problems develop. The patient verbalized understanding.      Micheline Chapman, MSN, FNP-BC   07/29/2015, 9:32 AM

## 2015-07-30 LAB — PSA: PSA: 0.6 ng/mL (ref <=4.00)

## 2015-08-02 ENCOUNTER — Telehealth: Payer: Self-pay

## 2015-08-02 ENCOUNTER — Other Ambulatory Visit: Payer: Self-pay | Admitting: Family Medicine

## 2015-08-02 MED ORDER — SIMVASTATIN 20 MG PO TABS: 20.0000 mg | ORAL_TABLET | Freq: Every day | ORAL | Status: DC

## 2015-08-02 NOTE — Telephone Encounter (Signed)
-----   Message from Micheline Chapman, NP sent at 08/02/2015  8:23 AM EDT ----- PSA normal, Renal function decreased., cholesterol high, will send in rx to treat. Have put in referral to urology.

## 2015-08-02 NOTE — Telephone Encounter (Signed)
Called and left message regarding Lab results, to pick up and start taking cholesterol med as prescribed, and that urology will be in contact soon regarding referral. Asked patient if he had any questions to call our office and left our call back information. Thanks!

## 2015-08-05 ENCOUNTER — Other Ambulatory Visit: Payer: No Typology Code available for payment source

## 2015-08-11 ENCOUNTER — Other Ambulatory Visit: Payer: No Typology Code available for payment source

## 2015-08-30 ENCOUNTER — Ambulatory Visit: Payer: No Typology Code available for payment source | Admitting: Family Medicine

## 2015-09-02 ENCOUNTER — Ambulatory Visit: Payer: No Typology Code available for payment source | Admitting: Family Medicine

## 2015-09-13 ENCOUNTER — Encounter: Payer: Self-pay | Admitting: Family Medicine

## 2015-09-13 ENCOUNTER — Ambulatory Visit (INDEPENDENT_AMBULATORY_CARE_PROVIDER_SITE_OTHER): Payer: No Typology Code available for payment source | Admitting: Family Medicine

## 2015-09-13 VITALS — BP 184/112 | HR 70 | Temp 98.2°F | Ht 73.0 in | Wt 203.0 lb

## 2015-09-13 DIAGNOSIS — I10 Essential (primary) hypertension: Secondary | ICD-10-CM

## 2015-09-13 DIAGNOSIS — Z23 Encounter for immunization: Secondary | ICD-10-CM

## 2015-09-13 DIAGNOSIS — R3911 Hesitancy of micturition: Secondary | ICD-10-CM

## 2015-09-13 MED ORDER — TAMSULOSIN HCL 0.4 MG PO CAPS: 0.4000 mg | ORAL_CAPSULE | Freq: Every day | ORAL | Status: DC

## 2015-09-13 MED ORDER — CLONIDINE HCL 0.1 MG PO TABS
0.2000 mg | ORAL_TABLET | Freq: Once | ORAL | Status: AC
  Administered 2015-09-13: 0.2 mg via ORAL

## 2015-09-13 MED ORDER — LISINOPRIL 20 MG PO TABS: 20.0000 mg | ORAL_TABLET | Freq: Every day | ORAL | Status: DC

## 2015-09-13 MED ORDER — CLONIDINE HCL 0.2 MG PO TABS: 0.2000 mg | ORAL_TABLET | Freq: Once | ORAL | Status: DC

## 2015-09-13 MED ORDER — DILTIAZEM HCL ER COATED BEADS 360 MG PO CP24: 360.0000 mg | ORAL_CAPSULE | Freq: Every day | ORAL | Status: DC

## 2015-09-13 MED ORDER — PRAVASTATIN SODIUM 40 MG PO TABS: 40.0000 mg | ORAL_TABLET | Freq: Every day | ORAL | Status: DC

## 2015-09-13 NOTE — Progress Notes (Signed)
Patient ID: Timothy [name], male   DOB: October 25, 1960, 54 y.o.   MRN: [id number]   Timothy [name], is a 54 y.o. male  [id number]  [id number]  DOB - August 05, 1961  CC:  Chief Complaint  Patient presents with  . Hypertension    needs refill took last pill today, he has been using them every other day due to cost at [facility] of 30.00       HPI: Timothy [name] is a 54 y.o. male here to follow-up on blood pressure. He has been prescribed Cardizem 360 but has only been taking every other day due to cost. His blood pressure today is 182/118. He also needs a refill on flomax for urinary hesitancy. When he was here last, I prescribed Simvastatin for elevated cholesterol but he has never started that. We have made a referral to a urologist but he has not been seen yet. It is unclear to me if an appointment was actually made or not. He is not consistently exercising or following a low salt diet. No Known Allergies Past Medical History  Diagnosis Date  . Hypertension   . Cancer ([place])   . BPH (benign prostatic hyperplasia)    Current Outpatient Prescriptions on File Prior to Visit  Medication Sig Dispense Refill  . cyclobenzaprine (FLEXERIL) 10 MG tablet Take 1 tablet (10 mg total) by mouth 3 (three) times daily as needed for muscle spasms. (Patient not taking: Reported on 07/15/2015) 30 tablet 0  . ibuprofen (ADVIL,MOTRIN) 200 MG tablet Take 800 mg by mouth every 6 (six) hours as needed for headache.     No current facility-administered medications on file prior to visit.   Family History  Problem Relation Age of Onset  . Heart disease Mother    Social History   Social History  . Marital Status: Married    Spouse Name: N/A  . Number of Children: N/A  . Years of Education: N/A   Occupational History  . Not on file.   Social History Main Topics  . Smoking status: Current Every Day Smoker -- 0.25 packs/day    Types: Cigarettes  . Smokeless tobacco: Not on file  . Alcohol Use: 4.2  oz/week    7 Cans of beer per week     Comment: drinks daily 1-2 beers  . Drug Use: 1.00 per week    Special: Marijuana  . Sexual Activity: Not on file     Comment: once a week   Other Topics Concern  . Not on file   Social History Narrative    Review of Systems: Constitutional: Negative  HENT: Negative  Eyes: Negative  Neck: Negative  Respiratory: Negative  Cardiovascular: Negative  Gastrointestinal: Negative  Genitourinary: Negative  Musculoskeletal: Negative Neurological: Negative  numbness  Hematological: Negative  Psychiatric/Behavioral: Negative     Objective:   Filed Vitals:   09/13/15 1559 09/13/15 1610  BP: 188/119 184/112  Pulse: 70   Temp: 98.2 F (36.8 C)     Physical Exam: Constitutional: Patient appears well-developed and well-nourished. No distress. HENT: Normocephalic, atraumatic, External right and left ear normal. Oropharynx is clear and moist.  Eyes: Conjunctivae and EOM are normal. PERRLA, no scleral icterus. Neck: Normal ROM. Neck supple. No lymphadenopathy, No thyromegaly. CVS: RRR, S1/S2 +, no murmurs, no gallops, no rubs Pulmonary: Effort and breath sounds normal, no stridor, rhonchi, wheezes, rales.  Abdominal: Soft. Normoactive BS,, no distension, tenderness, rebound or guarding.  Musculoskeletal: Normal range of motion. No edema and no tenderness.  Neuro:  Alert.Normal muscle tone coordination. Non-focal Skin: Skin is warm and dry. No rash noted. Not diaphoretic. No erythema. No pallor. Psychiatric: Normal mood and affect. Behavior, judgment, thought content normal.  Lab Results  Component Value Date   WBC 6.3 07/29/2015   HGB 16.2 07/29/2015   HCT 47.7 07/29/2015   MCV 87.2 07/29/2015   PLT 300 07/29/2015   Lab Results  Component Value Date   CREATININE 1.59* 07/29/2015   BUN 19 07/29/2015   NA 137 07/29/2015   K 4.1 07/29/2015   CL 102 07/29/2015   CO2 25 07/29/2015    No results found for: HGBA1C Lipid Panel      Component Value Date/Time   CHOL 242* 07/29/2015 0943   TRIG 171* 07/29/2015 0943   HDL 51 07/29/2015 0943   CHOLHDL 4.7 07/29/2015 0943   VLDL 34* 07/29/2015 0943   LDLCALC 157* 07/29/2015 0943       Assessment and plan:   1. Essential hypertension -Continue Cardizem 360 daily, #90 with 1 refill -Add lisinopril 20 daily, # 90 with 1 refill -Clonidine  0.2 mg 4:00 pm. -Have reviewed the importance of low salt and following medication regime as ordered.  -Will send RX to [facility] to see if they can help with his medications.  2. Urinary hesitancy Refill of Flomax 0.4 mg, one po daily, #30 with 3 refills. He has been referred to urology but has not been seen yet.  3. Need for influenza vaccine - influenza vaccine give  4. Need for tetantus booster Tdap given today.  Return in about 2 weeks (around 09/27/2015).  The patient was given clear instructions to go to ER or return to medical center if symptoms don't improve, worsen or new problems develop. The patient verbalized understanding.      Micheline Chapman, MSN, FNP-BC   09/15/2015, 6:52 PM

## 2015-09-13 NOTE — Patient Instructions (Addendum)
You may pick your medications at [facility] at [address] Continue to take the cardizen and I am adding lisinopril 20 daily Watch the salt in your diet. Try to stop smoking . Come back in 2 weeks for a BP check by the nurse.

## 2015-09-27 ENCOUNTER — Ambulatory Visit: Payer: No Typology Code available for payment source | Admitting: Family Medicine

## 2015-10-17 DIAGNOSIS — N186 End stage renal disease: Secondary | ICD-10-CM

## 2015-10-17 HISTORY — DX: End stage renal disease: N18.6

## 2015-12-17 MED FILL — ?TAMSULOSIN HCL 0.4 MG CAP: 0.4 | 30 days supply | Qty: 30 | Fill #1

## 2016-07-16 HISTORY — PX: TRANSTHORACIC ECHOCARDIOGRAM: SHX275

## 2016-07-17 ENCOUNTER — Emergency Department (HOSPITAL_COMMUNITY): Payer: Medicaid Other

## 2016-07-17 ENCOUNTER — Inpatient Hospital Stay (HOSPITAL_COMMUNITY)
Admission: EM | Admit: 2016-07-17 | Discharge: 2016-07-22 | DRG: 291 | Disposition: A | Discharge status: Home / Self Care | Payer: Medicaid Other | Attending: Internal Medicine | Admitting: Internal Medicine

## 2016-07-17 ENCOUNTER — Encounter (HOSPITAL_COMMUNITY): Payer: Self-pay

## 2016-07-17 DIAGNOSIS — N184 Chronic kidney disease, stage 4 (severe): Secondary | ICD-10-CM | POA: Diagnosis present

## 2016-07-17 DIAGNOSIS — F17210 Nicotine dependence, cigarettes, uncomplicated: Secondary | ICD-10-CM | POA: Diagnosis present

## 2016-07-17 DIAGNOSIS — I5031 Acute diastolic (congestive) heart failure: Secondary | ICD-10-CM | POA: Diagnosis not present

## 2016-07-17 DIAGNOSIS — I130 Hypertensive heart and chronic kidney disease with heart failure and stage 1 through stage 4 chronic kidney disease, or unspecified chronic kidney disease: Secondary | ICD-10-CM | POA: Diagnosis present

## 2016-07-17 DIAGNOSIS — I119 Hypertensive heart disease without heart failure: Secondary | ICD-10-CM | POA: Diagnosis present

## 2016-07-17 DIAGNOSIS — F1110 Opioid abuse, uncomplicated: Secondary | ICD-10-CM | POA: Diagnosis present

## 2016-07-17 DIAGNOSIS — I169 Hypertensive crisis, unspecified: Secondary | ICD-10-CM | POA: Diagnosis present

## 2016-07-17 DIAGNOSIS — D649 Anemia, unspecified: Secondary | ICD-10-CM | POA: Diagnosis present

## 2016-07-17 DIAGNOSIS — E876 Hypokalemia: Secondary | ICD-10-CM | POA: Diagnosis present

## 2016-07-17 DIAGNOSIS — I248 Other forms of acute ischemic heart disease: Secondary | ICD-10-CM | POA: Diagnosis present

## 2016-07-17 DIAGNOSIS — F1010 Alcohol abuse, uncomplicated: Secondary | ICD-10-CM | POA: Diagnosis present

## 2016-07-17 DIAGNOSIS — R51 Headache: Secondary | ICD-10-CM | POA: Diagnosis not present

## 2016-07-17 DIAGNOSIS — R778 Other specified abnormalities of plasma proteins: Secondary | ICD-10-CM | POA: Diagnosis present

## 2016-07-17 DIAGNOSIS — N179 Acute kidney failure, unspecified: Secondary | ICD-10-CM | POA: Diagnosis present

## 2016-07-17 DIAGNOSIS — R0602 Shortness of breath: Secondary | ICD-10-CM

## 2016-07-17 DIAGNOSIS — I509 Heart failure, unspecified: Secondary | ICD-10-CM

## 2016-07-17 DIAGNOSIS — Z9119 Patient's noncompliance with other medical treatment and regimen: Secondary | ICD-10-CM

## 2016-07-17 DIAGNOSIS — I132 Hypertensive heart and chronic kidney disease with heart failure and with stage 5 chronic kidney disease, or end stage renal disease: Secondary | ICD-10-CM | POA: Diagnosis present

## 2016-07-17 DIAGNOSIS — R7989 Other specified abnormal findings of blood chemistry: Secondary | ICD-10-CM

## 2016-07-17 DIAGNOSIS — R079 Chest pain, unspecified: Secondary | ICD-10-CM

## 2016-07-17 DIAGNOSIS — Z79899 Other long term (current) drug therapy: Secondary | ICD-10-CM

## 2016-07-17 DIAGNOSIS — I10 Essential (primary) hypertension: Secondary | ICD-10-CM | POA: Diagnosis not present

## 2016-07-17 DIAGNOSIS — I5033 Acute on chronic diastolic (congestive) heart failure: Secondary | ICD-10-CM | POA: Diagnosis present

## 2016-07-17 DIAGNOSIS — T463X5A Adverse effect of coronary vasodilators, initial encounter: Secondary | ICD-10-CM | POA: Diagnosis not present

## 2016-07-17 DIAGNOSIS — N400 Enlarged prostate without lower urinary tract symptoms: Secondary | ICD-10-CM | POA: Diagnosis present

## 2016-07-17 DIAGNOSIS — R52 Pain, unspecified: Secondary | ICD-10-CM

## 2016-07-17 DIAGNOSIS — Z85038 Personal history of other malignant neoplasm of large intestine: Secondary | ICD-10-CM | POA: Diagnosis not present

## 2016-07-17 DIAGNOSIS — I5043 Acute on chronic combined systolic (congestive) and diastolic (congestive) heart failure: Secondary | ICD-10-CM | POA: Diagnosis present

## 2016-07-17 DIAGNOSIS — R748 Abnormal levels of other serum enzymes: Secondary | ICD-10-CM | POA: Diagnosis not present

## 2016-07-17 DIAGNOSIS — I110 Hypertensive heart disease with heart failure: Secondary | ICD-10-CM

## 2016-07-17 HISTORY — DX: Other specified abnormal findings of blood chemistry: R79.89

## 2016-07-17 HISTORY — DX: Hypertensive heart disease with heart failure: I11.0

## 2016-07-17 HISTORY — DX: Acute diastolic (congestive) heart failure: I50.31

## 2016-07-17 HISTORY — DX: Other specified abnormalities of plasma proteins: R77.8

## 2016-07-17 LAB — RAPID URINE DRUG SCREEN, HOSP PERFORMED
AMPHETAMINES: NOT DETECTED
BENZODIAZEPINES: NOT DETECTED
Barbiturates: NOT DETECTED
Cocaine: NOT DETECTED
OPIATES: POSITIVE — AB
Tetrahydrocannabinol: POSITIVE — AB

## 2016-07-17 LAB — CBC
HEMATOCRIT: 37.9 % — AB (ref 39.0–52.0)
HEMOGLOBIN: 12.4 g/dL — AB (ref 13.0–17.0)
MCH: 29 pg (ref 26.0–34.0)
MCHC: 32.7 g/dL (ref 30.0–36.0)
MCV: 88.6 fL (ref 78.0–100.0)
Platelets: 231 10*3/uL (ref 150–400)
RBC: 4.28 MIL/uL (ref 4.22–5.81)
RDW: 14.8 % (ref 11.5–15.5)
WBC: 7.8 10*3/uL (ref 4.0–10.5)

## 2016-07-17 LAB — BASIC METABOLIC PANEL
ANION GAP: 11 (ref 5–15)
BUN: 49 mg/dL — ABNORMAL HIGH (ref 6–20)
CHLORIDE: 102 mmol/L (ref 101–111)
CO2: 26 mmol/L (ref 22–32)
Calcium: 8.6 mg/dL — ABNORMAL LOW (ref 8.9–10.3)
Creatinine, Ser: 4.88 mg/dL — ABNORMAL HIGH (ref 0.61–1.24)
GFR calc non Af Amer: 12 mL/min — ABNORMAL LOW (ref >60)
GFR, EST AFRICAN AMERICAN: 14 mL/min — AB (ref >60)
Glucose, Bld: 108 mg/dL — ABNORMAL HIGH (ref 65–99)
POTASSIUM: 3.4 mmol/L — AB (ref 3.5–5.1)
Sodium: 139 mmol/L (ref 135–145)

## 2016-07-17 LAB — BRAIN NATRIURETIC PEPTIDE: B NATRIURETIC PEPTIDE 5: 3281.4 pg/mL — AB (ref 0.0–100.0)

## 2016-07-17 LAB — I-STAT TROPONIN, ED: Troponin i, poc: 0.19 ng/mL (ref 0.00–0.08)

## 2016-07-17 LAB — MRSA PCR SCREENING: MRSA BY PCR: NEGATIVE

## 2016-07-17 LAB — TROPONIN I: TROPONIN I: 0.17 ng/mL — AB (ref <0.03)

## 2016-07-17 MED ORDER — MORPHINE SULFATE (PF) 2 MG/ML IV SOLN
2.0000 mg | INTRAVENOUS | Status: DC | PRN
  Administered 2016-07-18 – 2016-07-19 (×8): 2 mg via INTRAVENOUS
  Filled 2016-07-17 (×8): qty 1

## 2016-07-17 MED ORDER — SODIUM CHLORIDE 0.9 % IV SOLN: 250.0000 mL | INTRAVENOUS | Status: DC | PRN

## 2016-07-17 MED ORDER — SODIUM CHLORIDE 0.9% FLUSH: 3.0000 mL | INTRAVENOUS | Status: DC | PRN

## 2016-07-17 MED ORDER — IPRATROPIUM-ALBUTEROL 0.5-2.5 (3) MG/3ML IN SOLN
3.0000 mL | Freq: Once | RESPIRATORY_TRACT | Status: DC
  Filled 2016-07-17: qty 3

## 2016-07-17 MED ORDER — ASPIRIN 81 MG PO CHEW
324.0000 mg | CHEWABLE_TABLET | Freq: Once | ORAL | Status: AC
  Administered 2016-07-17: 324 mg via ORAL
  Filled 2016-07-17: qty 4

## 2016-07-17 MED ORDER — MORPHINE SULFATE (PF) 4 MG/ML IV SOLN
4.0000 mg | Freq: Once | INTRAVENOUS | Status: AC
  Administered 2016-07-17: 4 mg via INTRAVENOUS
  Filled 2016-07-17: qty 1

## 2016-07-17 MED ORDER — POTASSIUM CHLORIDE CRYS ER 20 MEQ PO TBCR
40.0000 meq | EXTENDED_RELEASE_TABLET | Freq: Every day | ORAL | Status: DC
  Administered 2016-07-17 – 2016-07-19 (×3): 40 meq via ORAL
  Filled 2016-07-17 (×3): qty 2

## 2016-07-17 MED ORDER — TAMSULOSIN HCL 0.4 MG PO CAPS
0.4000 mg | ORAL_CAPSULE | Freq: Every day | ORAL | Status: DC
  Administered 2016-07-17 – 2016-07-22 (×6): 0.4 mg via ORAL
  Filled 2016-07-17 (×6): qty 1

## 2016-07-17 MED ORDER — ACETAMINOPHEN 325 MG PO TABS
650.0000 mg | ORAL_TABLET | Freq: Once | ORAL | Status: AC
  Administered 2016-07-17: 650 mg via ORAL
  Filled 2016-07-17: qty 2

## 2016-07-17 MED ORDER — DILTIAZEM HCL ER COATED BEADS 180 MG PO CP24
360.0000 mg | ORAL_CAPSULE | Freq: Every day | ORAL | Status: DC
  Administered 2016-07-17 – 2016-07-19 (×3): 360 mg via ORAL
  Filled 2016-07-17 (×3): qty 2

## 2016-07-17 MED ORDER — HEPARIN SODIUM (PORCINE) 5000 UNIT/ML IJ SOLN
5000.0000 [IU] | Freq: Three times a day (TID) | INTRAMUSCULAR | Status: DC
  Administered 2016-07-17 – 2016-07-21 (×9): 5000 [IU] via SUBCUTANEOUS
  Filled 2016-07-17 (×12): qty 1

## 2016-07-17 MED ORDER — LABETALOL HCL 5 MG/ML IV SOLN
5.0000 mg | Freq: Once | INTRAVENOUS | Status: DC
  Filled 2016-07-17: qty 4

## 2016-07-17 MED ORDER — SODIUM CHLORIDE 0.9 % IV BOLUS (SEPSIS): 1000.0000 mL | Freq: Once | INTRAVENOUS | Status: DC

## 2016-07-17 MED ORDER — FUROSEMIDE 10 MG/ML IJ SOLN
80.0000 mg | Freq: Once | INTRAMUSCULAR | Status: AC
  Administered 2016-07-17: 80 mg via INTRAVENOUS
  Filled 2016-07-17: qty 8

## 2016-07-17 MED ORDER — NITROGLYCERIN 0.4 MG SL SUBL: 0.4000 mg | SUBLINGUAL_TABLET | SUBLINGUAL | Status: DC | PRN

## 2016-07-17 MED ORDER — FUROSEMIDE 10 MG/ML IJ SOLN
80.0000 mg | Freq: Two times a day (BID) | INTRAMUSCULAR | Status: DC
  Administered 2016-07-17: 80 mg via INTRAVENOUS
  Filled 2016-07-17: qty 8

## 2016-07-17 MED ORDER — ACETAMINOPHEN 325 MG PO TABS
650.0000 mg | ORAL_TABLET | ORAL | Status: DC | PRN
  Administered 2016-07-18 – 2016-07-21 (×8): 650 mg via ORAL
  Filled 2016-07-17 (×8): qty 2

## 2016-07-17 MED ORDER — ISOSORB DINITRATE-HYDRALAZINE 20-37.5 MG PO TABS
1.0000 | ORAL_TABLET | Freq: Three times a day (TID) | ORAL | Status: DC
  Administered 2016-07-17 – 2016-07-19 (×5): 1 via ORAL
  Filled 2016-07-17 (×6): qty 1

## 2016-07-17 MED ORDER — ONDANSETRON HCL 4 MG/2ML IJ SOLN
4.0000 mg | Freq: Four times a day (QID) | INTRAMUSCULAR | Status: DC | PRN
  Administered 2016-07-18 (×2): 4 mg via INTRAVENOUS
  Filled 2016-07-17 (×2): qty 2

## 2016-07-17 MED ORDER — NITROGLYCERIN IN D5W 200-5 MCG/ML-% IV SOLN
0.0000 ug/min | INTRAVENOUS | Status: DC
  Administered 2016-07-17 – 2016-07-19 (×3): 110 ug/min via INTRAVENOUS
  Filled 2016-07-17 (×5): qty 250

## 2016-07-17 MED ORDER — SODIUM CHLORIDE 0.9% FLUSH
3.0000 mL | Freq: Two times a day (BID) | INTRAVENOUS | Status: DC
  Administered 2016-07-17 – 2016-07-22 (×10): 3 mL via INTRAVENOUS

## 2016-07-17 MED ORDER — NITROGLYCERIN IN D5W 200-5 MCG/ML-% IV SOLN
10.0000 ug/min | INTRAVENOUS | Status: DC
  Administered 2016-07-17: 50 ug/min via INTRAVENOUS
  Filled 2016-07-17: qty 250

## 2016-07-17 NOTE — ED Triage Notes (Signed)
Per Pt, Pt is coming from home with complaints of chest pain that is on the left side and radiates to the left chest that started Friday with a productive cough. Pt reports having a yellow sputum. Reports starting to have emesis and diarrhea last night.

## 2016-07-17 NOTE — ED Notes (Signed)
Pt in Xray unable to get blood at this time

## 2016-07-17 NOTE — ED Notes (Signed)
Pt taken to CT.

## 2016-07-17 NOTE — H&P (Signed)
History and Physical    Timothy [name] GYI:[id number] DOB: [birth year] DOA: 07/17/2016  Referring MD/NP/PA: er PA PCP: No PCP Per Patient Outpatient Specialists:  Patient coming from: home  Chief Complaint: chest pain and cough  HPI: Timothy [name] is a 55 y.o. male who is a terribly poor historian, with medical history significant of BPH, "valve" issue, colon cancer?, and heroin abuse (snorting).  He presents to the ER with shortness of breath, worsening chest pain and coughing.  Patient states he last used heroin a few days ago and uses at least monthly.  Denies cocaine.  Has not been taking all his medications (lisinopril).  Moved here a few years ago from [facility].  ED Course: In the ER, x ray showed Kerly B lines, BNP was markedly elevated, CR was 4-- from baseline of 1.6.  His BP was found to be elevated and he admits to a headache.    Review of Systems: all systems reviewed, negative unless stated above in HPI   Past Medical History:  Diagnosis Date  . BPH (benign prostatic hyperplasia)   . Cancer ([place])   . Hypertension     Past Surgical History:  Procedure Laterality Date  . ABDOMINAL SURGERY    . COLON SURGERY       reports that he has been smoking Cigarettes.  He has been smoking about 0.25 packs per day. He has never used smokeless tobacco. He reports that he drinks about 4.2 oz of alcohol per week . He reports that he uses drugs, including Marijuana, about 1 time per week.  No Known Allergies  Family History  Problem Relation Age of Onset  . Heart disease Mother      Prior to Admission medications   Medication Sig Start Date End Date Taking? Authorizing Provider  acetaminophen (TYLENOL) 325 MG tablet Take 650 mg by mouth every 6 (six) hours as needed for mild pain.   Yes Historical Provider, MD  diltiazem (CARDIZEM CD) 360 MG 24 hr capsule Take 1 capsule (360 mg total) by mouth daily. 09/13/15  Yes Micheline Chapman, NP  ibuprofen (ADVIL,MOTRIN) 200 MG  tablet Take 800 mg by mouth every 6 (six) hours as needed for headache.   Yes Historical Provider, MD  cyclobenzaprine (FLEXERIL) 10 MG tablet Take 1 tablet (10 mg total) by mouth 3 (three) times daily as needed for muscle spasms. Patient not taking: Reported on 07/17/2016 07/13/15   Melony Overly, MD  lisinopril (PRINIVIL,ZESTRIL) 20 MG tablet Take 1 tablet (20 mg total) by mouth daily. Patient not taking: Reported on 07/17/2016 09/13/15   Micheline Chapman, NP  pravastatin (PRAVACHOL) 40 MG tablet Take 1 tablet (40 mg total) by mouth daily. Patient not taking: Reported on 07/17/2016 09/13/15   Micheline Chapman, NP  tamsulosin (FLOMAX) 0.4 MG CAPS capsule Take 1 capsule (0.4 mg total) by mouth daily. Patient not taking: Reported on 07/17/2016 09/13/15   Micheline Chapman, NP    Physical Exam: Vitals:   07/17/16 1330 07/17/16 1445 07/17/16 1500 07/17/16 1515  BP: (!) 195/141 (!) 184/129 (!) 191/119 (!) 187/130  Pulse: 72 68 67 64  Resp: 14     SpO2: 96% 96% 97% 95%  Weight:      Height:          Constitutional: poor eye contact, flat Vitals:   07/17/16 1330 07/17/16 1445 07/17/16 1500 07/17/16 1515  BP: (!) 195/141 (!) 184/129 (!) 191/119 (!) 187/130  Pulse: 72 68 67 64  Resp: 14     SpO2: 96% 96% 97% 95%  Weight:      Height:       Eyes: PERRL, lids and conjunctivae normal ENMT: Mucous membranes are moist. Posterior pharynx clear of any exudate or lesions.Normal dentition.  Neck: +JVD Respiratory: coarse breath sounds Cardiovascular: Regular rate and rhythm, no murmurs / rubs / gallops. No extremity edema. Abdomen: no tenderness, no masses palpated. No hepatosplenomegaly. Bowel sounds positive.  Musculoskeletal: no clubbing / cyanosis. No joint deformity upper and lower extremities. Good ROM, no contractures. Normal muscle tone.  Skin: no rashes, lesions, ulcers. No induration Neurologic: CN 2-12 grossly intact. Sensation intact, DTR normal. Strength 5/5 in all 4.  Psychiatric:  flat  Labs on Admission: I have personally reviewed following labs and imaging studies  CBC:  Recent Labs Lab 07/17/16 1133  WBC 7.8  HGB 12.4*  HCT 37.9*  MCV 88.6  PLT 829   Basic Metabolic Panel:  Recent Labs Lab 07/17/16 1133  NA 139  K 3.4*  CL 102  CO2 26  GLUCOSE 108*  BUN 49*  CREATININE 4.88*  CALCIUM 8.6*   GFR: Estimated Creatinine Clearance: 18.7 mL/min (by C-G formula based on SCr of 4.88 mg/dL (H)). Liver Function Tests: No results for input(s): AST, ALT, ALKPHOS, BILITOT, PROT, ALBUMIN in the last 168 hours. No results for input(s): LIPASE, AMYLASE in the last 168 hours. No results for input(s): AMMONIA in the last 168 hours. Coagulation Profile: No results for input(s): INR, PROTIME in the last 168 hours. Cardiac Enzymes: No results for input(s): CKTOTAL, CKMB, CKMBINDEX, TROPONINI in the last 168 hours. BNP (last 3 results) No results for input(s): PROBNP in the last 8760 hours. HbA1C: No results for input(s): HGBA1C in the last 72 hours. CBG: No results for input(s): GLUCAP in the last 168 hours. Lipid Profile: No results for input(s): CHOL, HDL, LDLCALC, TRIG, CHOLHDL, LDLDIRECT in the last 72 hours. Thyroid Function Tests: No results for input(s): TSH, T4TOTAL, FREET4, T3FREE, THYROIDAB in the last 72 hours. Anemia Panel: No results for input(s): VITAMINB12, FOLATE, FERRITIN, TIBC, IRON, RETICCTPCT in the last 72 hours. Urine analysis: No results found for: COLORURINE, APPEARANCEUR, LABSPEC, PHURINE, GLUCOSEU, HGBUR, BILIRUBINUR, KETONESUR, PROTEINUR, UROBILINOGEN, NITRITE, LEUKOCYTESUR Sepsis Labs: Invalid input(s): PROCALCITONIN, LACTICIDVEN No results found for this or any previous visit (from the past 240 hour(s)).   Radiological Exams on Admission: Ct Abdomen Pelvis Wo Contrast  Result Date: 07/17/2016 CLINICAL DATA:  Left chest pain radiating to back, productive cough. Diarrhea/ vomiting. EXAM: CT CHEST, ABDOMEN AND PELVIS WITHOUT  CONTRAST TECHNIQUE: Multidetector CT imaging of the chest, abdomen and pelvis was performed following the standard protocol without IV contrast. COMPARISON:  CT abdomen/ pelvis dated 06/25/2015. FINDINGS: CT CHEST FINDINGS Cardiovascular: Mild cardiomegaly.  Small pericardial effusion. Mild atherosclerotic calcifications of the aortic arch. 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 Mediastinum/Nodes: No suspicious mediastinal lymphadenopathy. Visualized thyroid is unremarkable. Lungs/Pleura: Small left and trace right pleural effusions. Mild ground-glass opacity/increased interstitial markings in the bilateral lower lobes, favored to reflect atelectasis. No focal consolidation. No suspicious pulmonary nodules. No pneumothorax. Musculoskeletal: Visualized osseous structures are within normal limits. CT ABDOMEN PELVIS FINDINGS Hepatobiliary: Unenhanced liver is unremarkable. Suspected 1.6 cm noncalcified gallstone and layering gallbladder sludge (series 201/ image 89). No associated inflammatory changes. Pancreas: Within normal limits. Spleen: Within normal limits. Adrenals/Urinary Tract: Adrenal glands are within normal limits. Kidneys are within normal limits.  No hydronephrosis. Bladder is within normal limits. Stomach/Bowel: Stomach is  within normal limits. Status post right hemicolectomy with appendectomy, better evaluated on prior CT. No evidence of bowel obstruction. Vascular/Lymphatic: No evidence of abdominal aortic aneurysm. Atherosclerotic calcifications of the abdominal aorta and branch vessels. No suspicious abdominopelvic lymphadenopathy. Reproductive: Prostate is unremarkable. Other: No abdominopelvic ascites. Musculoskeletal: Mild degenerative changes at L3-4. 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 No evidence of thoracoabdominal aortic aneurysm. Please note that dissection cannot be excluded on unenhanced CT. Small pericardial effusion, new from 2016 CT  abdomen/pelvis. Consider echocardiogram for further evaluation as clinically warranted. Small bilateral pleural effusions, left greater than right. Mild bilateral lower lobe atelectasis. Status post right hemicolectomy with appendectomy. No evidence of bowel obstruction. Electronically Signed   By: Julian Hy M.D.   On: 07/17/2016 14:53   Dg Chest 2 View  Result Date: 07/17/2016 CLINICAL DATA:  Chest pain.  Shortness of breath EXAM: CHEST  2 VIEW COMPARISON:  No prior. FINDINGS: Mediastinum and hilar structures normal. Cardiomegaly with normal pulmonary vascularity mild pulmonary interstitial prominence consistent congestive heart failure. Mild bibasilar atelectasis and/or scarring. No pleural effusion or pneumothorax. IMPRESSION: Cardiomegaly with pulmonary vascular prominence and bilateral interstitial prominence with Kerley B-lines. Small bilateral pleural effusions. Findings consistent mild congestive heart failure. Electronically Signed   By: Marcello Moores  Register   On: 07/17/2016 10:15   Ct Chest Wo Contrast  Result Date: 07/17/2016 CLINICAL DATA:  Left chest pain radiating to back, productive cough. Diarrhea/ vomiting. EXAM: CT CHEST, ABDOMEN AND PELVIS WITHOUT CONTRAST TECHNIQUE: Multidetector CT imaging of the chest, abdomen and pelvis was performed following the standard protocol without IV contrast. COMPARISON:  CT abdomen/ pelvis dated 06/25/2015. FINDINGS: CT CHEST FINDINGS Cardiovascular: Mild cardiomegaly.  Small pericardial effusion. Mild atherosclerotic calcifications of the aortic arch. 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 Mediastinum/Nodes: No suspicious mediastinal lymphadenopathy. Visualized thyroid is unremarkable. Lungs/Pleura: Small left and trace right pleural effusions. Mild ground-glass opacity/increased interstitial markings in the bilateral lower lobes, favored to reflect atelectasis. No focal consolidation. No suspicious pulmonary nodules. No  pneumothorax. Musculoskeletal: Visualized osseous structures are within normal limits. CT ABDOMEN PELVIS FINDINGS Hepatobiliary: Unenhanced liver is unremarkable. Suspected 1.6 cm noncalcified gallstone and layering gallbladder sludge (series 201/ image 89). No associated inflammatory changes. Pancreas: Within normal limits. Spleen: Within normal limits. Adrenals/Urinary Tract: Adrenal glands are within normal limits. Kidneys are within normal limits.  No hydronephrosis. Bladder is within normal limits. Stomach/Bowel: Stomach is within normal limits. Status post right hemicolectomy with appendectomy, better evaluated on prior CT. No evidence of bowel obstruction. Vascular/Lymphatic: No evidence of abdominal aortic aneurysm. Atherosclerotic calcifications of the abdominal aorta and branch vessels. No suspicious abdominopelvic lymphadenopathy. Reproductive: Prostate is unremarkable. Other: No abdominopelvic ascites. Musculoskeletal: Mild degenerative changes at L3-4. 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 No evidence of thoracoabdominal aortic aneurysm. Please note that dissection cannot be excluded on unenhanced CT. Small pericardial effusion, new from 2016 CT abdomen/pelvis. Consider echocardiogram for further evaluation as clinically warranted. Small bilateral pleural effusions, left greater than right. Mild bilateral lower lobe atelectasis. Status post right hemicolectomy with appendectomy. No evidence of bowel obstruction. Electronically Signed   By: Julian Hy M.D.   On: 07/17/2016 14:53    EKG: Independently reviewed. NSR with [facility]  Assessment/Plan Active Problems:   CHF (congestive heart failure) (H[facility])   AKI (acute kidney injury) ([place])   Heroin abuse   HTN (hypertension)    New CHF-- echo pending -cardiology consult -I/O Daily weights -IV lasix -  nitro gtt started in ER for HTN crisis-- lower BP slowly  AKI -suspect from volume overload -IV lasix -BMP  in AM  HTN -nitro gtt -resume cardizem -hold ACE  Heroin abuse (snorts) -monitor -encouraged cessation -UDS to r/o cocaine  H/o colon cancer -does have evidence of hemicolectomy   DVT prophylaxis: heparin Code Status: full Family Communication: patient Disposition Plan: sdu Consults called: cardiology to see    Geradine Girt DO [facility] Pager [phone]  If 7PM-7AM, please contact night-coverage [email or web address] Password [email or web address]  07/17/2016, 4:17 PM

## 2016-07-17 NOTE — ED Provider Notes (Signed)
[place] DEPT Provider Note   CSN: [id number] Arrival date & time: 07/17/16  9562     History   Chief Complaint Chief Complaint  Patient presents with  . Chest Pain    HPI Timothy [name] is a 55 y.o. male.  HPI    Timothy [name] is a 55 y.o. male, with a history of Colon CA in remission and HTN, presenting to the ED with chest pain, productive cough with yellow sputum, shortness of breath, and subjective fever for the past 4 days. Pt states, "It feels like my pneumonia is back." Chest pain is sharp, located in the left chest, rates it 8/10, radiates around to the back. Pain arose after a few hours of coughing four days ago. Has not taken any medications for the pain. Worse with coughing. Endorses vomiting beginning last night with four episodes since it began. Also endorses a bilateral frontal headache beginning last night. Pain is throbbing, pain is moderate, nonradiating. Denies history of DVT/PE, recent surgery/trauma, recent extended travel, leg swelling, but does have a history of colon cancer.  Patient is supposed to be taking lisinopril, but has not taken it in about 2 months because he ran out and never got it refilled. States he is still taking his Cardizem.    Past Medical History:  Diagnosis Date  . BPH (benign prostatic hyperplasia)   . Cancer ([place])   . Hypertension     Patient Active Problem List   Diagnosis Date Noted  . CHF (congestive heart failure) ([place]) 07/17/2016  . AKI (acute kidney injury) ([place]) 07/17/2016  . Heroin abuse 07/17/2016  . HTN (hypertension) 07/17/2016  . Benign prostate hyperplasia 07/16/2015    Past Surgical History:  Procedure Laterality Date  . ABDOMINAL SURGERY    . COLON SURGERY         Home Medications    Prior to Admission medications   Medication Sig Start Date End Date Taking? Authorizing Provider  acetaminophen (TYLENOL) 325 MG tablet Take 650 mg by mouth every 6 (six) hours as needed for mild pain.   Yes  Historical Provider, MD  diltiazem (CARDIZEM CD) 360 MG 24 hr capsule Take 1 capsule (360 mg total) by mouth daily. 09/13/15  Yes Micheline Chapman, NP  ibuprofen (ADVIL,MOTRIN) 200 MG tablet Take 800 mg by mouth every 6 (six) hours as needed for headache.   Yes Historical Provider, MD  cyclobenzaprine (FLEXERIL) 10 MG tablet Take 1 tablet (10 mg total) by mouth 3 (three) times daily as needed for muscle spasms. Patient not taking: Reported on 07/17/2016 07/13/15   Melony Overly, MD  lisinopril (PRINIVIL,ZESTRIL) 20 MG tablet Take 1 tablet (20 mg total) by mouth daily. Patient not taking: Reported on 07/17/2016 09/13/15   Micheline Chapman, NP  pravastatin (PRAVACHOL) 40 MG tablet Take 1 tablet (40 mg total) by mouth daily. Patient not taking: Reported on 07/17/2016 09/13/15   Micheline Chapman, NP  tamsulosin (FLOMAX) 0.4 MG CAPS capsule Take 1 capsule (0.4 mg total) by mouth daily. Patient not taking: Reported on 07/17/2016 09/13/15   Micheline Chapman, NP    Family History Family History  Problem Relation Age of Onset  . Heart disease Mother     Social History Social History  Substance Use Topics  . Smoking status: Current Every Day Smoker    Packs/day: 0.25    Types: Cigarettes  . Smokeless tobacco: Never Used  . Alcohol use 4.2 oz/week    7 Cans of beer per  week     Comment: drinks daily 1-2 beers     Allergies   Review of patient's allergies indicates no known allergies.   Review of Systems Review of Systems  Constitutional: Positive for fever (subjective). Negative for chills and diaphoresis.  Respiratory: Positive for cough and shortness of breath.   Cardiovascular: Positive for chest pain.  Gastrointestinal: Positive for nausea and vomiting. Negative for abdominal pain.  Musculoskeletal: Negative for neck pain and neck stiffness.  Neurological: Positive for headaches. Negative for dizziness, syncope, weakness, light-headedness and numbness.  All other systems reviewed  and are negative.    Physical Exam Updated Vital Signs BP (!) 223/160 (BP Location: Left Arm)   Pulse 83   Resp 17   Ht 6\' 1"  (1.854 m)   Wt 77.1 kg   SpO2 97%   BMI 22.43 kg/m   Physical Exam  Constitutional: He is oriented to person, place, and time. He appears well-developed and well-nourished. No distress.  HENT:  Head: Normocephalic and atraumatic.  Mouth/Throat: Mucous membranes are dry.  Eyes: Conjunctivae and EOM are normal. Pupils are equal, round, and reactive to light.  Neck: Normal range of motion. Neck supple.  Cardiovascular: Normal rate, regular rhythm, normal heart sounds and intact distal pulses.   Pulmonary/Chest: Effort normal. He has wheezes. He has rales in the right lower field and the left lower field. He exhibits tenderness (left lateral chest).  Increased work of breathing. Patient speaks in full sentences, but has to rest in between.   Abdominal: Soft. There is no tenderness. There is no guarding.  Musculoskeletal: He exhibits no edema or tenderness.  Full ROM in all extremities and spine. No midline spinal tenderness.   Lymphadenopathy:    He has no cervical adenopathy.  Neurological: He is alert and oriented to person, place, and time.  No sensory deficits. Strength 5/5 in all extremities. Coordination intact. Cranial nerves III-XII grossly intact. No facial droop.   Skin: Skin is warm and dry. He is not diaphoretic.  Psychiatric: He has a normal mood and affect. His behavior is normal.  Nursing note and vitals reviewed.    ED Treatments / Results  Labs (all labs ordered are listed, but only abnormal results are displayed) Labs Reviewed  BASIC METABOLIC PANEL - Abnormal; Notable for the following:       Result Value   Potassium 3.4 (*)    Glucose, Bld 108 (*)    BUN 49 (*)    Creatinine, Ser 4.88 (*)    Calcium 8.6 (*)    GFR calc non Af Amer 12 (*)    GFR calc Af Amer 14 (*)    All other components within normal limits  CBC - Abnormal;  Notable for the following:    Hemoglobin 12.4 (*)    HCT 37.9 (*)    All other components within normal limits  BRAIN NATRIURETIC PEPTIDE - Abnormal; Notable for the following:    B Natriuretic Peptide 3,281.4 (*)    All other components within normal limits  I-STAT TROPOININ, ED - Abnormal; Notable for the following:    Troponin i, poc 0.19 (*)    All other components within normal limits  URINE RAPID DRUG SCREEN, HOSP PERFORMED    BUN  Date Value Ref Range Status  07/17/2016 49 (H) 6 - 20 mg/dL Final  07/29/2015 19 7 - 25 mg/dL Final  07/05/2015 19 6 - 20 mg/dL Final   Creat  Date Value Ref Range Status  07/29/2015  1.59 (H) 0.70 - 1.33 mg/dL Final   Creatinine, Ser  Date Value Ref Range Status  07/17/2016 4.88 (H) 0.61 - 1.24 mg/dL Final  07/05/2015 1.68 (H) 0.61 - 1.24 mg/dL Final     EKG  EKG Interpretation  Date/Time:  Monday July 17 2016 09:07:25 EDT Ventricular Rate:  89 PR Interval:  156 QRS Duration: 88 QT Interval:  396 QTC Calculation: 481 R Axis:   63 Text Interpretation:  Normal sinus rhythm Left ventricular hypertrophy with repolarization abnormality Anteroseptal infarct , age undetermined Abnormal ECG No old tracing to compare Confirmed by GOLDSTON MD, [place] [phone]) on 07/17/2016 11:57:05 AM       Radiology Ct Abdomen Pelvis Wo Contrast  Result Date: 07/17/2016 CLINICAL DATA:  Left chest pain radiating to back, productive cough. Diarrhea/ vomiting. EXAM: CT CHEST, ABDOMEN AND PELVIS WITHOUT CONTRAST TECHNIQUE: Multidetector CT imaging of the chest, abdomen and pelvis was performed following the standard protocol without IV contrast. COMPARISON:  CT abdomen/ pelvis dated 06/25/2015. FINDINGS: CT CHEST FINDINGS Cardiovascular: Mild cardiomegaly.  Small pericardial effusion. Mild atherosclerotic calcifications of the aortic Timothy. 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 Mediastinum/Nodes: No suspicious mediastinal lymphadenopathy.  Visualized thyroid is unremarkable. Lungs/Pleura: Small left and trace right pleural effusions. Mild ground-glass opacity/increased interstitial markings in the bilateral lower lobes, favored to reflect atelectasis. No focal consolidation. No suspicious pulmonary nodules. No pneumothorax. Musculoskeletal: Visualized osseous structures are within normal limits. CT ABDOMEN PELVIS FINDINGS Hepatobiliary: Unenhanced liver is unremarkable. Suspected 1.6 cm noncalcified gallstone and layering gallbladder sludge (series 201/ image 89). No associated inflammatory changes. Pancreas: Within normal limits. Spleen: Within normal limits. Adrenals/Urinary Tract: Adrenal glands are within normal limits. Kidneys are within normal limits.  No hydronephrosis. Bladder is within normal limits. Stomach/Bowel: Stomach is within normal limits. Status post right hemicolectomy with appendectomy, better evaluated on prior CT. No evidence of bowel obstruction. Vascular/Lymphatic: No evidence of abdominal aortic aneurysm. Atherosclerotic calcifications of the abdominal aorta and branch vessels. No suspicious abdominopelvic lymphadenopathy. Reproductive: Prostate is unremarkable. Other: No abdominopelvic ascites. Musculoskeletal: Mild degenerative changes at L3-4. 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 No evidence of thoracoabdominal aortic aneurysm. Please note that dissection cannot be excluded on unenhanced CT. Small pericardial effusion, new from 2016 CT abdomen/pelvis. Consider echocardiogram for further evaluation as clinically warranted. Small bilateral pleural effusions, left greater than right. Mild bilateral lower lobe atelectasis. Status post right hemicolectomy with appendectomy. No evidence of bowel obstruction. Electronically Signed   By: Julian Hy M.D.   On: 07/17/2016 14:53   Dg Chest 2 View  Result Date: 07/17/2016 CLINICAL DATA:  Chest pain.  Shortness of breath EXAM: CHEST  2 VIEW  COMPARISON:  No prior. FINDINGS: Mediastinum and hilar structures normal. Cardiomegaly with normal pulmonary vascularity mild pulmonary interstitial prominence consistent congestive heart failure. Mild bibasilar atelectasis and/or scarring. No pleural effusion or pneumothorax. IMPRESSION: Cardiomegaly with pulmonary vascular prominence and bilateral interstitial prominence with Kerley B-lines. Small bilateral pleural effusions. Findings consistent mild congestive heart failure. Electronically Signed   By: Marcello Moores  Register   On: 07/17/2016 10:15   Ct Chest Wo Contrast  Result Date: 07/17/2016 CLINICAL DATA:  Left chest pain radiating to back, productive cough. Diarrhea/ vomiting. EXAM: CT CHEST, ABDOMEN AND PELVIS WITHOUT CONTRAST TECHNIQUE: Multidetector CT imaging of the chest, abdomen and pelvis was performed following the standard protocol without IV contrast. COMPARISON:  CT abdomen/ pelvis dated 06/25/2015. FINDINGS: CT CHEST FINDINGS Cardiovascular: Mild cardiomegaly.  Small pericardial  effusion. Mild atherosclerotic calcifications of the aortic Timothy. 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 Mediastinum/Nodes: No suspicious mediastinal lymphadenopathy. Visualized thyroid is unremarkable. Lungs/Pleura: Small left and trace right pleural effusions. Mild ground-glass opacity/increased interstitial markings in the bilateral lower lobes, favored to reflect atelectasis. No focal consolidation. No suspicious pulmonary nodules. No pneumothorax. Musculoskeletal: Visualized osseous structures are within normal limits. CT ABDOMEN PELVIS FINDINGS Hepatobiliary: Unenhanced liver is unremarkable. Suspected 1.6 cm noncalcified gallstone and layering gallbladder sludge (series 201/ image 89). No associated inflammatory changes. Pancreas: Within normal limits. Spleen: Within normal limits. Adrenals/Urinary Tract: Adrenal glands are within normal limits. Kidneys are within normal limits.  No  hydronephrosis. Bladder is within normal limits. Stomach/Bowel: Stomach is within normal limits. Status post right hemicolectomy with appendectomy, better evaluated on prior CT. No evidence of bowel obstruction. Vascular/Lymphatic: No evidence of abdominal aortic aneurysm. Atherosclerotic calcifications of the abdominal aorta and branch vessels. No suspicious abdominopelvic lymphadenopathy. Reproductive: Prostate is unremarkable. Other: No abdominopelvic ascites. Musculoskeletal: Mild degenerative changes at L3-4. 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 No evidence of thoracoabdominal aortic aneurysm. Please note that dissection cannot be excluded on unenhanced CT. Small pericardial effusion, new from 2016 CT abdomen/pelvis. Consider echocardiogram for further evaluation as clinically warranted. Small bilateral pleural effusions, left greater than right. Mild bilateral lower lobe atelectasis. Status post right hemicolectomy with appendectomy. No evidence of bowel obstruction. Electronically Signed   By: Julian Hy M.D.   On: 07/17/2016 14:53    Procedures Procedures (including critical care time)  CRITICAL CARE Performed by: Shamra Bradeen C Jace Fermin Total critical care time: 35 minutes Critical care time was exclusive of separately billable procedures and treating other patients. Critical care was necessary to treat or prevent imminent or life-threatening deterioration. Critical care was time spent personally by me on the following activities: development of treatment plan with patient and/or surrogate as well as nursing, discussions with consultants, evaluation of patient's response to treatment, examination of patient, obtaining history from patient or surrogate, ordering and performing treatments and interventions, ordering and review of laboratory studies, ordering and review of radiographic studies, pulse oximetry and re-evaluation of patient's condition.  Medications  Ordered in ED Medications  nitroGLYCERIN (NITROSTAT) SL tablet 0.4 mg (not administered)  nitroGLYCERIN 50 mg in dextrose 5 % 250 mL (0.2 mg/mL) infusion (105 mcg/min Intravenous Rate/Dose Change 07/17/16 1527)  aspirin chewable tablet 324 mg (324 mg Oral Given 07/17/16 1302)  acetaminophen (TYLENOL) tablet 650 mg (650 mg Oral Given 07/17/16 1446)  furosemide (LASIX) injection 80 mg (80 mg Intravenous Given 07/17/16 1446)     Initial Impression / Assessment and Plan / ED Course  I have reviewed the triage vital signs and the nursing notes.  Pertinent labs & imaging results that were available during my care of the patient were reviewed by me and considered in my medical decision making (see chart for details).  Clinical Course    Patient presents with increasing shortness of breath and chest pain radiating to the back beginning 4 days ago, worsening significantly yesterday. Working diagnoses of new onset CHF versus aortic dissection versus hypertensive emergency, or a combination. Patient's creatinine of 4.88 would not support the use of contrast for a formal aortic dissection study.   Findings and plan of care discussed with Sherwood Gambler, MD. Dr. Regenia Skeeter personally evaluated and examined this patient.   3:36 PM Spoke with Dr. Eliseo Squires, hospitalist, who agreed to admit the patient to inpatient stepdown. Requests that we still  consult cardiology. ' 4:42 PM Spoke with Dr. Claiborne Billings, cardiology, who agreed to consult on the patient. Agreed with current therapies, does not recommend any changes or additions at this time.    Vitals:   07/17/16 0906 07/17/16 0909 07/17/16 1134 07/17/16 1145  BP: (!) 222/164  (!) 223/160 (!) 225/147  Pulse: 84  83 80  Resp: (!) 28  17 14   SpO2: 96%  97% 100%  Weight:  77.1 kg    Height:  6\' 1"  (1.854 m)     Vitals:   07/17/16 1330 07/17/16 1445 07/17/16 1500 07/17/16 1515  BP: (!) 195/141 (!) 184/129 (!) 191/119 (!) 187/130  Pulse: 72 68 67 64  Resp: 14       SpO2: 96% 96% 97% 95%  Weight:      Height:         Final Clinical Impressions(s) / ED Diagnoses   Final diagnoses:  Chest pain, unspecified type  Hypertension, unspecified type  Chest pain    New Prescriptions New Prescriptions   No medications on file     Lorayne Bender, PA-C 07/17/16 1643    Sherwood Gambler, MD 07/17/16 1742

## 2016-07-18 ENCOUNTER — Inpatient Hospital Stay (HOSPITAL_COMMUNITY): Payer: Medicaid Other

## 2016-07-18 ENCOUNTER — Encounter (HOSPITAL_COMMUNITY): Payer: Self-pay | Admitting: Physician Assistant

## 2016-07-18 DIAGNOSIS — I10 Essential (primary) hypertension: Secondary | ICD-10-CM

## 2016-07-18 DIAGNOSIS — I5031 Acute diastolic (congestive) heart failure: Secondary | ICD-10-CM

## 2016-07-18 DIAGNOSIS — R7989 Other specified abnormal findings of blood chemistry: Secondary | ICD-10-CM

## 2016-07-18 DIAGNOSIS — I509 Heart failure, unspecified: Secondary | ICD-10-CM

## 2016-07-18 DIAGNOSIS — R778 Other specified abnormalities of plasma proteins: Secondary | ICD-10-CM | POA: Diagnosis present

## 2016-07-18 DIAGNOSIS — I248 Other forms of acute ischemic heart disease: Secondary | ICD-10-CM

## 2016-07-18 DIAGNOSIS — N179 Acute kidney failure, unspecified: Secondary | ICD-10-CM

## 2016-07-18 DIAGNOSIS — R748 Abnormal levels of other serum enzymes: Secondary | ICD-10-CM

## 2016-07-18 LAB — CBC
HEMATOCRIT: 32.9 % — AB (ref 39.0–52.0)
HEMOGLOBIN: 11 g/dL — AB (ref 13.0–17.0)
MCH: 29.1 pg (ref 26.0–34.0)
MCHC: 33.4 g/dL (ref 30.0–36.0)
MCV: 87 fL (ref 78.0–100.0)
Platelets: 195 10*3/uL (ref 150–400)
RBC: 3.78 MIL/uL — AB (ref 4.22–5.81)
RDW: 14.4 % (ref 11.5–15.5)
WBC: 9.3 10*3/uL (ref 4.0–10.5)

## 2016-07-18 LAB — ECHOCARDIOGRAM COMPLETE
CHL CUP MV DEC (S): 176
EERAT: 14.58
EWDT: 176 ms
FS: 26 % — AB (ref 28–44)
Height: 73 in
IVS/LV PW RATIO, ED: 0.94
LA ID, A-P, ES: 50 mm
LADIAMINDEX: 2.63 cm/m2
LAVOL: 95.7 mL
LAVOLA4C: 86.9 mL
LAVOLIN: 50.4 mL/m2
LEFT ATRIUM END SYS DIAM: 50 mm
LV E/e' medial: 14.58
LV PW d: 14.3 mm — AB (ref 0.6–1.1)
LV TDI E'MEDIAL: 8.16
LV e' LATERAL: 6.42 cm/s
LVEEAVG: 14.58
LVOT area: 4.15 cm2
LVOTD: 23 mm
Lateral S' vel: 16.2 cm/s
MV Peak grad: 4 mmHg
MV pk E vel: 93.6 m/s
MVPKAVEL: 69.8 m/s
RV TAPSE: 28.2 mm
RV sys press: 35 mmHg
Reg peak vel: 284 cm/s
TDI e' lateral: 6.42
TR max vel: 284 cm/s
Weight: 2398.6 oz

## 2016-07-18 LAB — TROPONIN I
TROPONIN I: 0.14 ng/mL — AB (ref <0.03)
Troponin I: 0.15 ng/mL (ref <0.03)

## 2016-07-18 LAB — BASIC METABOLIC PANEL
ANION GAP: 9 (ref 5–15)
BUN: 51 mg/dL — AB (ref 6–20)
CHLORIDE: 100 mmol/L — AB (ref 101–111)
CO2: 25 mmol/L (ref 22–32)
Calcium: 8.2 mg/dL — ABNORMAL LOW (ref 8.9–10.3)
Creatinine, Ser: 4.94 mg/dL — ABNORMAL HIGH (ref 0.61–1.24)
GFR calc Af Amer: 14 mL/min — ABNORMAL LOW (ref >60)
GFR, EST NON AFRICAN AMERICAN: 12 mL/min — AB (ref >60)
Glucose, Bld: 156 mg/dL — ABNORMAL HIGH (ref 65–99)
POTASSIUM: 2.7 mmol/L — AB (ref 3.5–5.1)
SODIUM: 134 mmol/L — AB (ref 135–145)

## 2016-07-18 MED ORDER — POTASSIUM CHLORIDE CRYS ER 20 MEQ PO TBCR
40.0000 meq | EXTENDED_RELEASE_TABLET | ORAL | Status: AC
  Administered 2016-07-18: 40 meq via ORAL
  Filled 2016-07-18: qty 2

## 2016-07-18 MED ORDER — FOLIC ACID 1 MG PO TABS
1.0000 mg | ORAL_TABLET | Freq: Every day | ORAL | Status: DC
  Administered 2016-07-18 – 2016-07-22 (×5): 1 mg via ORAL
  Filled 2016-07-18 (×5): qty 1

## 2016-07-18 MED ORDER — ADULT MULTIVITAMIN W/MINERALS CH
1.0000 | ORAL_TABLET | Freq: Every day | ORAL | Status: DC
  Administered 2016-07-18 – 2016-07-22 (×5): 1 via ORAL
  Filled 2016-07-18 (×6): qty 1

## 2016-07-18 MED ORDER — LABETALOL HCL 200 MG PO TABS
200.0000 mg | ORAL_TABLET | Freq: Two times a day (BID) | ORAL | Status: DC
  Administered 2016-07-18: 200 mg via ORAL
  Filled 2016-07-18: qty 1

## 2016-07-18 MED ORDER — LORAZEPAM 2 MG/ML IJ SOLN: 1.0000 mg | Freq: Four times a day (QID) | INTRAMUSCULAR | Status: AC | PRN

## 2016-07-18 MED ORDER — LORAZEPAM 1 MG PO TABS
1.0000 mg | ORAL_TABLET | Freq: Four times a day (QID) | ORAL | Status: AC | PRN
  Administered 2016-07-18 – 2016-07-21 (×4): 1 mg via ORAL
  Filled 2016-07-18 (×5): qty 1

## 2016-07-18 MED ORDER — VITAMIN B-1 100 MG PO TABS
100.0000 mg | ORAL_TABLET | Freq: Every day | ORAL | Status: DC
  Administered 2016-07-18 – 2016-07-22 (×5): 100 mg via ORAL
  Filled 2016-07-18 (×5): qty 1

## 2016-07-18 MED ORDER — MORPHINE SULFATE (PF) 4 MG/ML IV SOLN
4.0000 mg | Freq: Once | INTRAVENOUS | Status: AC
  Administered 2016-07-18: 4 mg via INTRAVENOUS
  Filled 2016-07-18: qty 1

## 2016-07-18 MED ORDER — THIAMINE HCL 100 MG/ML IJ SOLN
100.0000 mg | Freq: Every day | INTRAMUSCULAR | Status: DC
  Filled 2016-07-18: qty 2

## 2016-07-18 MED ORDER — HYDRALAZINE HCL 20 MG/ML IJ SOLN
10.0000 mg | Freq: Once | INTRAMUSCULAR | Status: AC
  Administered 2016-07-18: 10 mg via INTRAVENOUS
  Filled 2016-07-18: qty 1

## 2016-07-18 MED ORDER — MORPHINE SULFATE (PF) 2 MG/ML IV SOLN
2.0000 mg | Freq: Once | INTRAVENOUS | Status: AC
  Administered 2016-07-18: 2 mg via INTRAVENOUS
  Filled 2016-07-18: qty 1

## 2016-07-18 NOTE — Progress Notes (Signed)
  Echocardiogram 2D Echocardiogram has been performed.  Timothy [name] 07/18/2016, 10:22 AM

## 2016-07-18 NOTE — Progress Notes (Signed)
PROGRESS NOTE    Timothy [name]  FMB:[id number] DOB: 1960-10-20 DOA: 07/17/2016 PCP: No PCP Per Patient   Outpatient Specialists:     Brief Narrative:  Timothy [name] is a 55 y.o. male who is a terribly poor historian, with medical history significant of BPH, "valve" issue, colon cancer?, and heroin abuse (snorting).  He presents to the ER with shortness of breath, worsening chest pain and coughing.  Patient states he last used heroin a few days ago and uses at least monthly.  Denies cocaine.  Has not been taking all his medications (lisinopril).  Moved here a few years ago from [facility].    Assessment & Plan:   Active Problems:   CHF (congestive heart failure) (HCC)   AKI (acute kidney injury) ([place])   Heroin abuse   HTN (hypertension)   New CHF-- echo pending (+ murmur but patient says he has a h/o valve issues -cardiology consult placed -I/O Daily weights -IV lasix- hold due to increasing CR until after seen by cardiology -nitro gtt started in ER for HTN crisis-- lower BP slowly- resume PO meds minus lisinopril  AKI -renal U/S -BMP in AM  HTN -nitro gtt -resume cardizem -hold ACE  Heroin abuse (snorts) -monitor -encouraged cessation  H/o colon cancer -does have evidence of hemicolectomy    DVT prophylaxis:  SQ Heparin  Code Status: Full Code   Family Communication: patient  Disposition Plan:  Remain in SDU   Consultants:   cardiology     Subjective: Still c/o chest pain  Objective: Vitals:   07/18/16 0400 07/18/16 0404 07/18/16 0500 07/18/16 0752  BP: (!) 165/95  (!) 179/95 (!) 173/103  Pulse: 73  86 79  Resp: 13  (!) 24 10  Temp:    98.2 F (36.8 C)  TempSrc:    Oral  SpO2: 95%  100% 97%  Weight:  68 kg (149 lb 14.6 oz)    Height:        Intake/Output Summary (Last 24 hours) at 07/18/16 0926 Last data filed at 07/18/16 0500  Gross per 24 hour  Intake           985.38 ml  Output             1475 ml  Net           -489.62 ml   Filed Weights   07/17/16 0909 07/17/16 1800 07/18/16 0404  Weight: 77.1 kg (170 lb) 74.3 kg (163 lb 12.8 oz) 68 kg (149 lb 14.6 oz)    Examination:  General exam: Appears calm and comfortable  Respiratory system: crackles at bases, no wheezing Cardiovascular system: S1 & S2 heard, RRR.   + murmurs, rubs, gallops or clicks. No pedal edema. Gastrointestinal system: Abdomen is nondistended, soft and nontender. No organomegaly or masses felt. Normal bowel sounds heard. Central nervous system: Alert and oriented. No focal neurological deficits. Extremities: Symmetric 5 x 5 power.     Data Reviewed: I have personally reviewed following labs and imaging studies  CBC:  Recent Labs Lab 07/17/16 1133 07/18/16 0734  WBC 7.8 9.3  HGB 12.4* 11.0*  HCT 37.9* 32.9*  MCV 88.6 87.0  PLT 231 701   Basic Metabolic Panel:  Recent Labs Lab 07/17/16 1133 07/18/16 0734  NA 139 134*  K 3.4* 2.7*  CL 102 100*  CO2 26 25  GLUCOSE 108* 156*  BUN 49* 51*  CREATININE 4.88* 4.94*  CALCIUM 8.6* 8.2*   GFR: Estimated Creatinine Clearance: 16.3 mL/min (  by C-G formula based on SCr of 4.94 mg/dL (H)). Liver Function Tests: No results for input(s): AST, ALT, ALKPHOS, BILITOT, PROT, ALBUMIN in the last 168 hours. No results for input(s): LIPASE, AMYLASE in the last 168 hours. No results for input(s): AMMONIA in the last 168 hours. Coagulation Profile: No results for input(s): INR, PROTIME in the last 168 hours. Cardiac Enzymes:  Recent Labs Lab 07/17/16 1841 07/18/16 0129 07/18/16 0734  TROPONINI 0.17* 0.15* 0.14*   BNP (last 3 results) No results for input(s): PROBNP in the last 8760 hours. HbA1C: No results for input(s): HGBA1C in the last 72 hours. CBG: No results for input(s): GLUCAP in the last 168 hours. Lipid Profile: No results for input(s): CHOL, HDL, LDLCALC, TRIG, CHOLHDL, LDLDIRECT in the last 72 hours. Thyroid Function Tests: No results for input(s):  TSH, T4TOTAL, FREET4, T3FREE, THYROIDAB in the last 72 hours. Anemia Panel: No results for input(s): VITAMINB12, FOLATE, FERRITIN, TIBC, IRON, RETICCTPCT in the last 72 hours. Urine analysis: No results found for: COLORURINE, APPEARANCEUR, LABSPEC, [place], GLUCOSEU, HGBUR, BILIRUBINUR, [place], PROTEINUR, UROBILINOGEN, NITRITE, LEUKOCYTESUR    Recent Results (from the past 240 hour(s))  MRSA PCR Screening     Status: None   Collection Time: 07/17/16  6:47 PM  Result Value Ref Range Status   MRSA by PCR NEGATIVE NEGATIVE Final    Comment:        The GeneXpert MRSA Assay (FDA approved for NASAL specimens only), is one component of a comprehensive MRSA colonization surveillance program. It is not intended to diagnose MRSA infection nor to guide or monitor treatment for MRSA infections.       Anti-infectives    None       Radiology Studies: Ct Abdomen Pelvis Wo Contrast  Result Date: 07/17/2016 CLINICAL DATA:  Left chest pain radiating to back, productive cough. Diarrhea/ vomiting. EXAM: CT CHEST, ABDOMEN AND PELVIS WITHOUT CONTRAST TECHNIQUE: Multidetector CT imaging of the chest, abdomen and pelvis was performed following the standard protocol without IV contrast. COMPARISON:  CT abdomen/ pelvis dated 06/25/2015. FINDINGS: CT CHEST FINDINGS Cardiovascular: Mild cardiomegaly.  Small pericardial effusion. Mild atherosclerotic calcifications of the aortic arch. 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 Mediastinum/Nodes: No suspicious mediastinal lymphadenopathy. Visualized thyroid is unremarkable. Lungs/Pleura: Small left and trace right pleural effusions. Mild ground-glass opacity/increased interstitial markings in the bilateral lower lobes, favored to reflect atelectasis. No focal consolidation. No suspicious pulmonary nodules. No pneumothorax. Musculoskeletal: Visualized osseous structures are within normal limits. CT ABDOMEN PELVIS FINDINGS Hepatobiliary:  Unenhanced liver is unremarkable. Suspected 1.6 cm noncalcified gallstone and layering gallbladder sludge (series 201/ image 89). No associated inflammatory changes. Pancreas: Within normal limits. Spleen: Within normal limits. Adrenals/Urinary Tract: Adrenal glands are within normal limits. Kidneys are within normal limits.  No hydronephrosis. Bladder is within normal limits. Stomach/Bowel: Stomach is within normal limits. Status post right hemicolectomy with appendectomy, better evaluated on prior CT. No evidence of bowel obstruction. Vascular/Lymphatic: No evidence of abdominal aortic aneurysm. Atherosclerotic calcifications of the abdominal aorta and branch vessels. No suspicious abdominopelvic lymphadenopathy. Reproductive: Prostate is unremarkable. Other: No abdominopelvic ascites. Musculoskeletal: Mild degenerative changes at L3-4. 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 No evidence of thoracoabdominal aortic aneurysm. Please note that dissection cannot be excluded on unenhanced CT. Small pericardial effusion, new from 2016 CT abdomen/pelvis. Consider echocardiogram for further evaluation as clinically warranted. Small bilateral pleural effusions, left greater than right. Mild bilateral lower lobe atelectasis. Status post right hemicolectomy with appendectomy.  No evidence of bowel obstruction. Electronically Signed   By: Julian Hy M.D.   On: 07/17/2016 14:53   Dg Chest 2 View  Result Date: 07/17/2016 CLINICAL DATA:  Chest pain.  Shortness of breath EXAM: CHEST  2 VIEW COMPARISON:  No prior. FINDINGS: Mediastinum and hilar structures normal. Cardiomegaly with normal pulmonary vascularity mild pulmonary interstitial prominence consistent congestive heart failure. Mild bibasilar atelectasis and/or scarring. No pleural effusion or pneumothorax. IMPRESSION: Cardiomegaly with pulmonary vascular prominence and bilateral interstitial prominence with Kerley B-lines. Small  bilateral pleural effusions. Findings consistent mild congestive heart failure. Electronically Signed   By: Marcello Moores  Register   On: 07/17/2016 10:15   Ct Chest Wo Contrast  Result Date: 07/17/2016 CLINICAL DATA:  Left chest pain radiating to back, productive cough. Diarrhea/ vomiting. EXAM: CT CHEST, ABDOMEN AND PELVIS WITHOUT CONTRAST TECHNIQUE: Multidetector CT imaging of the chest, abdomen and pelvis was performed following the standard protocol without IV contrast. COMPARISON:  CT abdomen/ pelvis dated 06/25/2015. FINDINGS: CT CHEST FINDINGS Cardiovascular: Mild cardiomegaly.  Small pericardial effusion. Mild atherosclerotic calcifications of the aortic arch. 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 Mediastinum/Nodes: No suspicious mediastinal lymphadenopathy. Visualized thyroid is unremarkable. Lungs/Pleura: Small left and trace right pleural effusions. Mild ground-glass opacity/increased interstitial markings in the bilateral lower lobes, favored to reflect atelectasis. No focal consolidation. No suspicious pulmonary nodules. No pneumothorax. Musculoskeletal: Visualized osseous structures are within normal limits. CT ABDOMEN PELVIS FINDINGS Hepatobiliary: Unenhanced liver is unremarkable. Suspected 1.6 cm noncalcified gallstone and layering gallbladder sludge (series 201/ image 89). No associated inflammatory changes. Pancreas: Within normal limits. Spleen: Within normal limits. Adrenals/Urinary Tract: Adrenal glands are within normal limits. Kidneys are within normal limits.  No hydronephrosis. Bladder is within normal limits. Stomach/Bowel: Stomach is within normal limits. Status post right hemicolectomy with appendectomy, better evaluated on prior CT. No evidence of bowel obstruction. Vascular/Lymphatic: No evidence of abdominal aortic aneurysm. Atherosclerotic calcifications of the abdominal aorta and branch vessels. No suspicious abdominopelvic lymphadenopathy. Reproductive: Prostate  is unremarkable. Other: No abdominopelvic ascites. Musculoskeletal: Mild degenerative changes at L3-4. 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 No evidence of thoracoabdominal aortic aneurysm. Please note that dissection cannot be excluded on unenhanced CT. Small pericardial effusion, new from 2016 CT abdomen/pelvis. Consider echocardiogram for further evaluation as clinically warranted. Small bilateral pleural effusions, left greater than right. Mild bilateral lower lobe atelectasis. Status post right hemicolectomy with appendectomy. No evidence of bowel obstruction. Electronically Signed   By: Julian Hy M.D.   On: 07/17/2016 14:53        Scheduled Meds: . diltiazem  360 mg Oral Daily  . folic acid  1 mg Oral Daily  . heparin  5,000 Units Subcutaneous Q8H  . isosorbide-hydrALAZINE  1 tablet Oral TID  . multivitamin with minerals  1 tablet Oral Daily  . potassium chloride  40 mEq Oral Daily  . potassium chloride  40 mEq Oral Q4H  . sodium chloride flush  3 mL Intravenous Q12H  . tamsulosin  0.4 mg Oral Daily  . thiamine  100 mg Oral Daily   Or  . thiamine  100 mg Intravenous Daily   Continuous Infusions: . nitroGLYCERIN 110 mcg/min (07/18/16 0422)     LOS: 1 day    Time spent: 35 min    [place], DO [facility] Pager [phone]  If 7PM-7AM, please contact night-coverage [email or web address] Password [email or web address] 07/18/2016, 9:26 AM

## 2016-07-18 NOTE — Consult Note (Signed)
CARDIOLOGY CONSULT NOTE   Patient ID: Timothy [name] MRN: [id number] DOB/AGE: [birth year] 55 y.o.  Admit date: 07/17/2016  Primary Physician   No PCP Per Patient Primary Cardiologist   New Reason for Consultation   CHF Requesting MD: Dr Eliseo Squires  [id number] Timothy [name] is a 55 y.o. year old male with a history of poorly controlled HTN, BPH, ETOH/tobacco/drug use and colon CA.   He was admitted 10/02 with cough, SOB and CHF, troponin mildly elevated, Creatinine also elevated, cards asked to see.   Timothy [name] was diagnosed with HTN at age 48. He has not been consistent with medications. His mother had CHF, but lived to age 71.   In the H&P, he had admitted to snorting heroin and states he has been doing that about 2 years. He is still smoking and drinking alcohol.   Timothy [name] began getting chest pain and SOB 4 days ago. The pain was constant. The SOB and pain were associated with cough, but the cough was non-productive. No fevers or chills. The chest pain was not exertional. The nitro and morphine have helped the chest pain, but now his head hurts and his back hurts (he is on IV NTG).   He has been out of BP medications at least 2 weeks. Daily problems with urination, difficulty starting and slow stream. Not a new problem.    Past Medical History:  Diagnosis Date  . Acute diastolic heart failure ([place]) 07/17/2016  . BPH (benign prostatic hyperplasia)   . Colon cancer ([place]) 2014  . Elevated troponin   . Hypertension      Past Surgical History:  Procedure Laterality Date  . ABDOMINAL SURGERY    . COLON SURGERY  2014    No Known Allergies  I have reviewed the patient's current medications . diltiazem  360 mg Oral Daily  . folic acid  1 mg Oral Daily  . heparin  5,000 Units Subcutaneous Q8H  . isosorbide-hydrALAZINE  1 tablet Oral TID  . multivitamin with minerals  1 tablet Oral Daily  . potassium chloride  40 mEq Oral Daily  . potassium chloride  40 mEq Oral Q4H  .  sodium chloride flush  3 mL Intravenous Q12H  . tamsulosin  0.4 mg Oral Daily  . thiamine  100 mg Oral Daily   . nitroGLYCERIN 110 mcg/min (07/18/16 0422)   sodium chloride, acetaminophen, LORazepam **OR** LORazepam, morphine injection, ondansetron (ZOFRAN) IV, sodium chloride flush  Prior to Admission medications   Medication Sig Start Date End Date Taking? Authorizing Provider  acetaminophen (TYLENOL) 325 MG tablet Take 650 mg by mouth every 6 (six) hours as needed for mild pain.   Yes Historical Provider, MD  diltiazem (CARDIZEM CD) 360 MG 24 hr capsule Take 1 capsule (360 mg total) by mouth daily. 09/13/15  Yes Micheline Chapman, NP  ibuprofen (ADVIL,MOTRIN) 200 MG tablet Take 800 mg by mouth every 6 (six) hours as needed for headache.   Yes Historical Provider, MD  cyclobenzaprine (FLEXERIL) 10 MG tablet Take 1 tablet (10 mg total) by mouth 3 (three) times daily as needed for muscle spasms. Patient not taking: Reported on 07/17/2016 07/13/15   Melony Overly, MD  lisinopril (PRINIVIL,ZESTRIL) 20 MG tablet Take 1 tablet (20 mg total) by mouth daily. Patient not taking: Reported on 07/17/2016 09/13/15   Micheline Chapman, NP  pravastatin (PRAVACHOL) 40 MG tablet Take 1 tablet (40 mg total) by mouth daily. Patient not taking: Reported on 07/17/2016 09/13/15  Micheline Chapman, NP  tamsulosin (FLOMAX) 0.4 MG CAPS capsule Take 1 capsule (0.4 mg total) by mouth daily. Patient not taking: Reported on 07/17/2016 09/13/15   Micheline Chapman, NP     Social History   Social History  . Marital status: Married    Spouse name: N/A  . Number of children: N/A  . Years of education: N/A   Occupational History  . Not on file.   Social History Main Topics  . Smoking status: Current Every Day Smoker    Packs/day: 0.25    Types: Cigarettes  . Smokeless tobacco: Never Used  . Alcohol use 4.2 oz/week    7 Cans of beer per week     Comment: drinks daily 1-2 beers  . Drug use:     Frequency: 1.0  time per week    Types: Marijuana, Heroin     Comment: snorts heroin  . Sexual activity: Not on file     Comment: once a week   Other Topics Concern  . Not on file   Social History Narrative  . No narrative on file    Family Status  Relation Status  . Mother Deceased  . Father Deceased   Family History  Problem Relation Age of Onset  . Heart disease Mother     Died at age 60.  Marland Kitchen Heart failure Mother      ROS:  Full 14 point review of systems complete and found to be negative unless listed above.  Physical Exam: Blood pressure (!) 173/103, pulse 79, temperature 98.2 F (36.8 C), temperature source Oral, resp. rate 10, height 6\' 1"  (1.854 m), weight 149 lb 14.6 oz (68 kg), SpO2 97 %.  General: Well developed, well nourished, male in no acute distress Head: Eyes PERRLA, No xanthomas.   Normocephalic and atraumatic, oropharynx without edema or exudate. Dentition: good Lungs: basilar rales Heart: HRRR S1 S2, no rub/gallop, 2/6 murmur. pulses are 2+ all 4 extrem.   Neck: No carotid bruits. No lymphadenopathy.  JVD elevated Abdomen: Bowel sounds present, abdomen soft and non-tender without masses or hernias noted. Msk:  No spine or cva tenderness. No weakness, no joint deformities or effusions. Extremities: No clubbing or cyanosis. No edema.  Neuro: Alert and oriented X 3. No focal deficits noted. Psych:  Good affect, responds appropriately Skin: No rashes or lesions noted.  Labs:   Lab Results  Component Value Date   WBC 9.3 07/18/2016   HGB 11.0 (L) 07/18/2016   HCT 32.9 (L) 07/18/2016   MCV 87.0 07/18/2016   PLT 195 07/18/2016     Recent Labs Lab 07/18/16 0734  NA 134*  K 2.7*  CL 100*  CO2 25  BUN 51*  CREATININE 4.94*  CALCIUM 8.2*  GLUCOSE 156*    Recent Labs  07/17/16 1841 07/18/16 0129 07/18/16 0734  TROPONINI 0.17* 0.15* 0.14*    Recent Labs  07/17/16 1221  TROPIPOC 0.19*   B Natriuretic Peptide  Date/Time Value Ref Range Status    07/17/2016 11:38 AM 3,281.4 (H) 0.0 - 100.0 pg/mL Final   Lab Results  Component Value Date   CHOL 242 (H) 07/29/2015   HDL 51 07/29/2015   LDLCALC 157 (H) 07/29/2015   TRIG 171 (H) 07/29/2015   Drugs of Abuse     Component Value Date/Time   OPIA POSITIVE (A) 07/17/2016 1612   COCAINE NONE DETECTED 07/17/2016 1612   BENZ NONE DETECTED 07/17/2016 1612   AMPHETMU NONE DETECTED 07/17/2016 1612  THC POSITIVE (A) 07/17/2016 1612   BARB NONE DETECTED 07/17/2016 1612     Echo: 07/18/2016 - Left ventricle: The cavity size was normal. There was moderate concentric hypertrophy. Systolic function was normal. The estimated ejection fraction was in the range of 60% to 65%. Wall motion was normal; there were no regional wall motion abnormalities. Features are consistent with a pseudonormal left ventricular filling pattern, with concomitant abnormal relaxation and increased filling pressure (grade 2 diastolic dysfunction). - Mitral valve: There was mild regurgitation. - Left atrium: The atrium was severely dilated. - Pulmonary arteries: PA peak pressure: 35 mm Hg (S). - Pericardium, extracardiac: A trivial, free-flowing pericardial effusion was identified circumferential to the heart. The fluid had no internal echoes. Impressions: - The right ventricular systolic pressure was increased consistent   with mild pulmonary hypertension.  ECG:  10/03 SR, LVH w/ strain  Radiology:  Ct Chest Abdomen Pelvis Wo Contrast Result Date: 07/17/2016 CLINICAL DATA:  Left chest pain radiating to back, productive cough. Diarrhea/ vomiting. EXAM: CT CHEST, ABDOMEN AND PELVIS WITHOUT CONTRAST TECHNIQUE: Multidetector CT imaging of the chest, abdomen and pelvis was performed following the standard protocol without IV contrast. COMPARISON:  CT abdomen/ pelvis dated 06/25/2015. FINDINGS: CT CHEST FINDINGS Cardiovascular: Mild cardiomegaly.  Small pericardial effusion. Mild atherosclerotic calcifications of the  aortic arch. 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 Mediastinum/Nodes: No suspicious mediastinal lymphadenopathy. Visualized thyroid is unremarkable. Lungs/Pleura: Small left and trace right pleural effusions. Mild ground-glass opacity/increased interstitial markings in the bilateral lower lobes, favored to reflect atelectasis. No focal consolidation. No suspicious pulmonary nodules. No pneumothorax. Musculoskeletal: Visualized osseous structures are within normal limits. CT ABDOMEN PELVIS FINDINGS Hepatobiliary: Unenhanced liver is unremarkable. Suspected 1.6 cm noncalcified gallstone and layering gallbladder sludge (series 201/ image 89). No associated inflammatory changes. Pancreas: Within normal limits. Spleen: Within normal limits. Adrenals/Urinary Tract: Adrenal glands are within normal limits. Kidneys are within normal limits.  No hydronephrosis. Bladder is within normal limits. Stomach/Bowel: Stomach is within normal limits. Status post right hemicolectomy with appendectomy, better evaluated on prior CT. No evidence of bowel obstruction. Vascular/Lymphatic: No evidence of abdominal aortic aneurysm. Atherosclerotic calcifications of the abdominal aorta and branch vessels. No suspicious abdominopelvic lymphadenopathy. Reproductive: Prostate is unremarkable. Other: No abdominopelvic ascites. Musculoskeletal: Mild degenerative changes at L3-4. 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 No evidence of thoracoabdominal aortic aneurysm. Please note that dissection cannot be excluded on unenhanced CT. Small pericardial effusion, new from 2016 CT abdomen/pelvis. Consider echocardiogram for further evaluation as clinically warranted. Small bilateral pleural effusions, left greater than right. Mild bilateral lower lobe atelectasis. Status post right hemicolectomy with appendectomy. No evidence of bowel obstruction. Electronically Signed   By: Julian Hy M.D.   On:  07/17/2016 14:53   Dg Chest 2 View Result Date: 07/17/2016 CLINICAL DATA:  Chest pain.  Shortness of breath EXAM: CHEST  2 VIEW COMPARISON:  No prior. FINDINGS: Mediastinum and hilar structures normal. Cardiomegaly with normal pulmonary vascularity mild pulmonary interstitial prominence consistent congestive heart failure. Mild bibasilar atelectasis and/or scarring. No pleural effusion or pneumothorax. IMPRESSION: Cardiomegaly with pulmonary vascular prominence and bilateral interstitial prominence with Kerley B-lines. Small bilateral pleural effusions. Findings consistent mild congestive heart failure. Electronically Signed   By: Marcello Moores  Register   On: 07/17/2016 10:15    ASSESSMENT AND PLAN:   The patient was seen today by Dr [place], the patient evaluated and the data reviewed.   Active Problems:   Acute diastolic heart  failure (HCC) - BP control is paramount - PAS was only 35 at echo, feel ok to be off Lasix - with significant decrease in renal function, consider renal consult    AKI (acute kidney injury) ([place]) - management per IM, consider Nephrology consult    Heroin abuse - per IM    HTN (hypertension) - Has been restarted on home dose Cardizem - HR is not low, no hx cocaine abuse, will try adding Labetalol 200 mg bid - IV NTG is helping BP, but the HA it gives him is making him take more morphine, a concern since it it renally cleared. Consider prn hydralazine - will add hydralazine 10 mg tid, uptitration prn    Elevated troponin - EF is normal and no WMA on echo - most likely demand ischemia from HTN plus possibly falsely elevated due to poor renal function - no clear crescendo/decrescendo pattern consistent with MI - chest pain not consistent with angina. - MD advise if any further evaluation needed.   SignedLenoard Aden 07/18/2016 3:24 PM Beeper [phone]  Co-Sign MD Patient seen and examined and history reviewed. Agree with above findings and plan. 55 yo  WM with long history of HTN- uncontrolled. Hasn't taken medication in at least 2 weeks. Presented with symptoms of increased dyspnea and chest and back pain. Found to be severely hypertensive. Evidence of acute diastolic CHF by exam. Progressive renal failure noted.  Ecg shows LVH with strain. Troponin elevated mildly with flat trend Echo shows normal LV function with grade 2 diastolic dysfunction  His troponin elevation is most consistent with demand ischemia in setting of severe uncontrolled HTN and CHF. I would not recommend further ischemic work up at this time. Need to focus on optimal BP control. I would add labetalol and Bidil. DC IV Ntg. Continue Cardizem. Current volume status looks OK post diuresis. Avoid NSAIDs and ACEi/ARB. Consider nephrology consult. Long term prognosis is poor due to noncompliance, substance abuse, and poor insight.   Elmus Mathes [place], [place] 07/18/2016 3:29 PM

## 2016-07-18 NOTE — Progress Notes (Signed)
Pt c/o headache throughout shift. NP paged multiple times for pain control. Pt began to c/o chest pain at 0445 stating it felt like a pulling and squeezing type pain that was sharp and that he was unable to take a deep breath. EKG obtained and MD notified. 2mg  morphine given for pain.

## 2016-07-19 ENCOUNTER — Encounter (HOSPITAL_COMMUNITY): Payer: Self-pay | Admitting: Nephrology

## 2016-07-19 ENCOUNTER — Inpatient Hospital Stay (HOSPITAL_COMMUNITY): Payer: Medicaid Other

## 2016-07-19 DIAGNOSIS — R079 Chest pain, unspecified: Secondary | ICD-10-CM

## 2016-07-19 DIAGNOSIS — I132 Hypertensive heart and chronic kidney disease with heart failure and with stage 5 chronic kidney disease, or end stage renal disease: Secondary | ICD-10-CM | POA: Diagnosis present

## 2016-07-19 DIAGNOSIS — I119 Hypertensive heart disease without heart failure: Secondary | ICD-10-CM | POA: Diagnosis present

## 2016-07-19 DIAGNOSIS — I110 Hypertensive heart disease with heart failure: Secondary | ICD-10-CM

## 2016-07-19 LAB — BASIC METABOLIC PANEL
ANION GAP: 11 (ref 5–15)
BUN: 55 mg/dL — AB (ref 6–20)
CALCIUM: 8.2 mg/dL — AB (ref 8.9–10.3)
CO2: 26 mmol/L (ref 22–32)
CREATININE: 5.72 mg/dL — AB (ref 0.61–1.24)
Chloride: 96 mmol/L — ABNORMAL LOW (ref 101–111)
GFR calc Af Amer: 12 mL/min — ABNORMAL LOW (ref >60)
GFR, EST NON AFRICAN AMERICAN: 10 mL/min — AB (ref >60)
GLUCOSE: 124 mg/dL — AB (ref 65–99)
Potassium: 3.3 mmol/L — ABNORMAL LOW (ref 3.5–5.1)
Sodium: 133 mmol/L — ABNORMAL LOW (ref 135–145)

## 2016-07-19 LAB — CBC
HCT: 30.7 % — ABNORMAL LOW (ref 39.0–52.0)
HEMOGLOBIN: 10.1 g/dL — AB (ref 13.0–17.0)
MCH: 28.7 pg (ref 26.0–34.0)
MCHC: 32.9 g/dL (ref 30.0–36.0)
MCV: 87.2 fL (ref 78.0–100.0)
PLATELETS: 205 10*3/uL (ref 150–400)
RBC: 3.52 MIL/uL — AB (ref 4.22–5.81)
RDW: 14.7 % (ref 11.5–15.5)
WBC: 6.8 10*3/uL (ref 4.0–10.5)

## 2016-07-19 LAB — URINALYSIS, ROUTINE W REFLEX MICROSCOPIC
BILIRUBIN URINE: NEGATIVE
GLUCOSE, UA: NEGATIVE mg/dL
HGB URINE DIPSTICK: NEGATIVE
KETONES UR: NEGATIVE mg/dL
Nitrite: NEGATIVE
PH: 5.5 (ref 5.0–8.0)
PROTEIN: 100 mg/dL — AB
Specific Gravity, Urine: 1.013 (ref 1.005–1.030)

## 2016-07-19 LAB — URINE MICROSCOPIC-ADD ON: RBC / HPF: NONE SEEN RBC/hpf (ref 0–5)

## 2016-07-19 LAB — MAGNESIUM: MAGNESIUM: 2.1 mg/dL (ref 1.7–2.4)

## 2016-07-19 MED ORDER — MORPHINE SULFATE (PF) 2 MG/ML IV SOLN
1.0000 mg | INTRAVENOUS | Status: DC | PRN
  Administered 2016-07-19 – 2016-07-22 (×8): 1 mg via INTRAVENOUS
  Filled 2016-07-19 (×8): qty 1

## 2016-07-19 MED ORDER — ISOSORB DINITRATE-HYDRALAZINE 20-37.5 MG PO TABS
1.0000 | ORAL_TABLET | Freq: Two times a day (BID) | ORAL | Status: DC
  Administered 2016-07-19 – 2016-07-22 (×7): 1 via ORAL
  Filled 2016-07-19 (×7): qty 1

## 2016-07-19 MED ORDER — LABETALOL HCL 100 MG PO TABS
100.0000 mg | ORAL_TABLET | Freq: Two times a day (BID) | ORAL | Status: DC
  Administered 2016-07-19 – 2016-07-21 (×6): 100 mg via ORAL
  Filled 2016-07-19 (×6): qty 1

## 2016-07-19 MED ORDER — ALBUTEROL SULFATE (2.5 MG/3ML) 0.083% IN NEBU
2.5000 mg | INHALATION_SOLUTION | RESPIRATORY_TRACT | Status: DC | PRN
  Administered 2016-07-19: 2.5 mg via RESPIRATORY_TRACT
  Filled 2016-07-19: qty 3

## 2016-07-19 MED ORDER — SPIRONOLACTONE 25 MG PO TABS
12.5000 mg | ORAL_TABLET | Freq: Two times a day (BID) | ORAL | Status: DC
  Administered 2016-07-19 – 2016-07-20 (×4): 12.5 mg via ORAL
  Filled 2016-07-19 (×4): qty 1

## 2016-07-19 NOTE — Consult Note (Signed)
[facility] Renal Consultation Note  Requesting MD: Eliseo Squires Indication for Consultation: A on CRF  HPI:  Timothy [name] is a 55 y.o. male.   He reportedly has had hypertension since age 68 and patient has not been compliant with medication regimen or Dr. follow-up.  He also has BPH and history of colon cancer- surgery only, no other treatment. His other issue is of alcohol/tobacco/heroin use.  He presented to the emergency department on 10/2 with chest pain and shortness of breath. He had been out of blood pressure medications for 2 weeks and blood pressure was noted to be in no 190/120 range. He tells me he was taking a lot of ibuprofen given to him by a friend. He was taking this for back pain. His creatinine on presentation was 4.88 in the last known one we have was 1.68 about a year ago. Renal ultrasound shows normal-sized kidneys but increased echogenicity and no obstruction.  Urinalysis has not been done.  Blood pressure has been controlled successfully as low as systolic of 314.  Urine output was initially good now appears to have fallen off some but also may not be completely recorded. Creatinine has worsened from 4.8 to 5.7 today and that is the reason for consultation. Also of note his potassium was low. He's currently on a blood pressure regimen of labetalol, BiDil, Cardizem, as well as Flomax. Echo surprisingly showed normal ejection fraction at 97-02% but diastolic dysfunction.   Creat  Date/Time Value Ref Range Status  07/29/2015 09:43 AM 1.59 (H) 0.70 - 1.33 mg/dL Final   Creatinine, Ser  Date/Time Value Ref Range Status  07/19/2016 02:40 AM 5.72 (H) 0.61 - 1.24 mg/dL Final  07/18/2016 07:34 AM 4.94 (H) 0.61 - 1.24 mg/dL Final  07/17/2016 11:33 AM 4.88 (H) 0.61 - 1.24 mg/dL Final  07/05/2015 11:47 AM 1.68 (H) 0.61 - 1.24 mg/dL Final     PMHx:   Past Medical History:  Diagnosis Date  . Acute diastolic heart failure ([place]) 07/17/2016  . BPH (benign prostatic  hyperplasia)   . Colon cancer ([place]) 2014  . Elevated troponin   . Hypertension     Past Surgical History:  Procedure Laterality Date  . ABDOMINAL SURGERY    . COLON SURGERY  2014    Family Hx:  Family History  Problem Relation Age of Onset  . Heart disease Mother     Died at age 67.  Marland Kitchen Heart failure Mother   . Kidney failure Sister     Social History:  reports that he has been smoking Cigarettes.  He has been smoking about 0.25 packs per day. He has never used smokeless tobacco. He reports that he drinks about 4.2 oz of alcohol per week . He reports that he uses drugs, including Marijuana and Heroin, about 1 time per week.  Allergies: No Known Allergies  Medications: Prior to Admission medications   Medication Sig Start Date End Date Taking? Authorizing Provider  acetaminophen (TYLENOL) 325 MG tablet Take 650 mg by mouth every 6 (six) hours as needed for mild pain.   Yes Historical Provider, MD  diltiazem (CARDIZEM CD) 360 MG 24 hr capsule Take 1 capsule (360 mg total) by mouth daily. 09/13/15  Yes Micheline Chapman, NP  ibuprofen (ADVIL,MOTRIN) 200 MG tablet Take 800 mg by mouth every 6 (six) hours as needed for headache.   Yes Historical Provider, MD  cyclobenzaprine (FLEXERIL) 10 MG tablet Take 1 tablet (10 mg total) by mouth 3 (three) times daily  as needed for muscle spasms. Patient not taking: Reported on 07/17/2016 07/13/15   Melony Overly, MD  lisinopril (PRINIVIL,ZESTRIL) 20 MG tablet Take 1 tablet (20 mg total) by mouth daily. Patient not taking: Reported on 07/17/2016 09/13/15   Micheline Chapman, NP  pravastatin (PRAVACHOL) 40 MG tablet Take 1 tablet (40 mg total) by mouth daily. Patient not taking: Reported on 07/17/2016 09/13/15   Micheline Chapman, NP  tamsulosin (FLOMAX) 0.4 MG CAPS capsule Take 1 capsule (0.4 mg total) by mouth daily. Patient not taking: Reported on 07/17/2016 09/13/15   Micheline Chapman, NP    I have reviewed the patient's current  medications.  Labs:  Results for orders placed or performed during the hospital encounter of 07/17/16 (from the past 48 hour(s))  I-stat troponin, ED     Status: Abnormal   Collection Time: 07/17/16 12:21 PM  Result Value Ref Range   Troponin i, poc 0.19 (HH) 0.00 - 0.08 ng/mL   Comment NOTIFIED PHYSICIAN    Comment 3            Comment: Due to the release kinetics of cTnI, a negative result within the first hours of the onset of symptoms does not rule out myocardial infarction with certainty. If myocardial infarction is still suspected, repeat the test at appropriate intervals.   Urine rapid drug screen (hosp performed)     Status: Abnormal   Collection Time: 07/17/16  4:12 PM  Result Value Ref Range   Opiates POSITIVE (A) NONE DETECTED   Cocaine NONE DETECTED NONE DETECTED   Benzodiazepines NONE DETECTED NONE DETECTED   Amphetamines NONE DETECTED NONE DETECTED   Tetrahydrocannabinol POSITIVE (A) NONE DETECTED   Barbiturates NONE DETECTED NONE DETECTED    Comment:        DRUG SCREEN FOR MEDICAL PURPOSES ONLY.  IF CONFIRMATION IS NEEDED FOR ANY PURPOSE, NOTIFY LAB WITHIN 5 DAYS.        LOWEST DETECTABLE LIMITS FOR URINE DRUG SCREEN Drug Class       Cutoff (ng/mL) Amphetamine      1000 Barbiturate      200 Benzodiazepine   735 Tricyclics       329 Opiates          300 Cocaine          300 THC              50   Troponin I (q 6hr x 3)     Status: Abnormal   Collection Time: 07/17/16  6:41 PM  Result Value Ref Range   Troponin I 0.17 (HH) <0.03 ng/mL    Comment: CRITICAL RESULT CALLED TO, READ BACK BY AND VERIFIED WITH: Amadeo Garnet 2035 07/17/16 D BRADLEY   MRSA PCR Screening     Status: None   Collection Time: 07/17/16  6:47 PM  Result Value Ref Range   MRSA by PCR NEGATIVE NEGATIVE    Comment:        The GeneXpert MRSA Assay (FDA approved for NASAL specimens only), is one component of a comprehensive MRSA colonization surveillance program. It is  not intended to diagnose MRSA infection nor to guide or monitor treatment for MRSA infections.   Troponin I (q 6hr x 3)     Status: Abnormal   Collection Time: 07/18/16  1:29 AM  Result Value Ref Range   Troponin I 0.15 (HH) <0.03 ng/mL    Comment: CRITICAL VALUE NOTED.  VALUE IS CONSISTENT WITH PREVIOUSLY REPORTED AND CALLED  VALUE.  Troponin I (q 6hr x 3)     Status: Abnormal   Collection Time: 07/18/16  7:34 AM  Result Value Ref Range   Troponin I 0.14 (HH) <0.03 ng/mL    Comment: CRITICAL VALUE NOTED.  VALUE IS CONSISTENT WITH PREVIOUSLY REPORTED AND CALLED VALUE.  Basic metabolic panel     Status: Abnormal   Collection Time: 07/18/16  7:34 AM  Result Value Ref Range   Sodium 134 (L) 135 - 145 mmol/L   Potassium 2.7 (LL) 3.5 - 5.1 mmol/L    Comment: CRITICAL RESULT CALLED TO, READ BACK BY AND VERIFIED WITH: MARTHA PERRY,RN AT [id number] 07/18/16 BY ZBEECH.    Chloride 100 (L) 101 - 111 mmol/L   CO2 25 22 - 32 mmol/L   Glucose, Bld 156 (H) 65 - 99 mg/dL   BUN 51 (H) 6 - 20 mg/dL   Creatinine, Ser 4.94 (H) 0.61 - 1.24 mg/dL   Calcium 8.2 (L) 8.9 - 10.3 mg/dL   GFR calc non Af Amer 12 (L) >60 mL/min   GFR calc Af Amer 14 (L) >60 mL/min    Comment: (NOTE) The eGFR has been calculated using the CKD EPI equation. This calculation has not been validated in all clinical situations. eGFR's persistently <60 mL/min signify possible Chronic Kidney Disease.    Anion gap 9 5 - 15  CBC     Status: Abnormal   Collection Time: 07/18/16  7:34 AM  Result Value Ref Range   WBC 9.3 4.0 - 10.5 K/uL   RBC 3.78 (L) 4.22 - 5.81 MIL/uL   Hemoglobin 11.0 (L) 13.0 - 17.0 g/dL   HCT 32.9 (L) 39.0 - 52.0 %   MCV 87.0 78.0 - 100.0 fL   MCH 29.1 26.0 - 34.0 pg   MCHC 33.4 30.0 - 36.0 g/dL   RDW 14.4 11.5 - 15.5 %   Platelets 195 150 - 400 K/uL  CBC     Status: Abnormal   Collection Time: 07/19/16  2:40 AM  Result Value Ref Range   WBC 6.8 4.0 - 10.5 K/uL   RBC 3.52 (L) 4.22 - 5.81 MIL/uL    Hemoglobin 10.1 (L) 13.0 - 17.0 g/dL   HCT 30.7 (L) 39.0 - 52.0 %   MCV 87.2 78.0 - 100.0 fL   MCH 28.7 26.0 - 34.0 pg   MCHC 32.9 30.0 - 36.0 g/dL   RDW 14.7 11.5 - 15.5 %   Platelets 205 150 - 400 K/uL  Magnesium     Status: None   Collection Time: 07/19/16  2:40 AM  Result Value Ref Range   Magnesium 2.1 1.7 - 2.4 mg/dL  Basic metabolic panel     Status: Abnormal   Collection Time: 07/19/16  2:40 AM  Result Value Ref Range   Sodium 133 (L) 135 - 145 mmol/L   Potassium 3.3 (L) 3.5 - 5.1 mmol/L    Comment: DELTA CHECK NOTED   Chloride 96 (L) 101 - 111 mmol/L   CO2 26 22 - 32 mmol/L   Glucose, Bld 124 (H) 65 - 99 mg/dL   BUN 55 (H) 6 - 20 mg/dL   Creatinine, Ser 5.72 (H) 0.61 - 1.24 mg/dL   Calcium 8.2 (L) 8.9 - 10.3 mg/dL   GFR calc non Af Amer 10 (L) >60 mL/min   GFR calc Af Amer 12 (L) >60 mL/min    Comment: (NOTE) The eGFR has been calculated using the CKD EPI equation. This calculation has not been  validated in all clinical situations. eGFR's persistently <60 mL/min signify possible Chronic Kidney Disease.    Anion gap 11 5 - 15     ROS:  A comprehensive review of systems was negative except for: Constitutional: positive for fatigue Respiratory: positive for cough Musculoskeletal: positive for back pain  Physical Exam: Vitals:   07/19/16 0700 07/19/16 0819  BP: 130/81 136/83  Pulse: 71 68  Resp: 16 16  Temp:  97.6 F (36.4 C)     General: Alert, no acute distress- cough productive of clear sputum HEENT: Pupils are equally round reactive to light, extra ocular motions are intact, mucous membranes are moist  Neck: No JVD  Heart:  Regular rate and rhythm Lungs: Decreased breath sounds at the bases Abdomen: Soft, nontender, nondistended . Back pain seems musculoskeletal  Extremities: Minimal edema  Skin: Warm and dry  Neuro: Alert and nonfocal  Assessment/Plan: 55 year old black male with long-standing poorly controlled hypertension now presenting with  acute on chronic renal failure  1.Renal- patient's baseline renal function is not completely normal. He now presents with acute on chronic renal failure in the setting of malignant hypertension missing blood pressure medications as well as NSAID use. He tells me that his sister is on dialysis which would put him at increased risk for renal failure as well. I have told him that he needs to stop taking NSAIDs and needs to make an effort at blood pressure control as that is the only thing that could possibly put the prospect of dialysis further of the future for him. He needs to stop with his lifestyle choices of tobacco alcohol and heroin as well.  Will check U/A to see if any proteinuria/hematuria that would guide further work up.  I guess could have something more exotic causing his AKI but the course is most consistent with HTN and NSAIDS 2. Hypertension/volume  - presenting with very high blood pressure. It was corrected perhaps too quickly so could've given him some ATN and that is what is responsible for his most recent climbing creatinine. We'll try to simplify his blood pressure regimen. He tells me that he was only on one medication ? and controlled in the past. Going to discontinue Cardizem will continue low-dose labetalol as well as BiDil but also add Aldactone because with low potassium Aldactone may be uniquely helpful and blood pressure control for him. If BP is great, will wean bidil. All blood pressure medications twice a day 3. Hypokalemia- on repletion. Aldactone may help 4. Anemia  - not a significant issue at this time. We'll follow   [place] Chelf A 07/19/2016, 12:14 PM

## 2016-07-19 NOTE — Progress Notes (Signed)
TELEMETRY: Reviewed telemetry pt in NSR: Vitals:   07/18/16 2300 07/19/16 0300 07/19/16 0309 07/19/16 0700  BP: (!) 146/73 102/63  130/81  Pulse:    71  Resp: 16 (!) 8  16  Temp: 98.4 F (36.9 C) 98 F (36.7 C)    TempSrc: Oral Oral    SpO2: 92% 94%  93%  Weight:   165 lb 5.5 oz (75 kg)   Height:        Intake/Output Summary (Last 24 hours) at 07/19/16 0819 Last data filed at 07/19/16 0740  Gross per 24 hour  Intake             1358 ml  Output              350 ml  Net             1008 ml   Filed Weights   07/17/16 1800 07/18/16 0404 07/19/16 0309  Weight: 163 lb 12.8 oz (74.3 kg) 149 lb 14.6 oz (68 kg) 165 lb 5.5 oz (75 kg)    Subjective  Denies any chest pain or SOB today. Feels well.  . diltiazem  360 mg Oral Daily  . folic acid  1 mg Oral Daily  . heparin  5,000 Units Subcutaneous Q8H  . isosorbide-hydrALAZINE  1 tablet Oral TID  . labetalol  100 mg Oral BID  . multivitamin with minerals  1 tablet Oral Daily  . potassium chloride  40 mEq Oral Daily  . sodium chloride flush  3 mL Intravenous Q12H  . tamsulosin  0.4 mg Oral Daily  . thiamine  100 mg Oral Daily      LABS: Basic Metabolic Panel:  Recent Labs  07/18/16 0734 07/19/16 0240  NA 134* 133*  K 2.7* 3.3*  CL 100* 96*  CO2 25 26  GLUCOSE 156* 124*  BUN 51* 55*  CREATININE 4.94* 5.72*  CALCIUM 8.2* 8.2*  MG  --  2.1   Liver Function Tests: No results for input(s): AST, ALT, ALKPHOS, BILITOT, PROT, ALBUMIN in the last 72 hours. No results for input(s): LIPASE, AMYLASE in the last 72 hours. CBC:  Recent Labs  07/18/16 0734 07/19/16 0240  WBC 9.3 6.8  HGB 11.0* 10.1*  HCT 32.9* 30.7*  MCV 87.0 87.2  PLT 195 205   Cardiac Enzymes:  Recent Labs  07/17/16 1841 07/18/16 0129 07/18/16 0734  TROPONINI 0.17* 0.15* 0.14*   BNP: No results for input(s): PROBNP in the last 72 hours. D-Dimer: No results for input(s): DDIMER in the last 72 hours. Hemoglobin A1C: No results for  input(s): HGBA1C in the last 72 hours. Fasting Lipid Panel: No results for input(s): CHOL, HDL, LDLCALC, TRIG, CHOLHDL, LDLDIRECT in the last 72 hours. Thyroid Function Tests: No results for input(s): TSH, T4TOTAL, T3FREE, THYROIDAB in the last 72 hours.  Invalid input(s): FREET3   Radiology/Studies:  Ct Abdomen Pelvis Wo Contrast  Result Date: 07/17/2016 CLINICAL DATA:  Left chest pain radiating to back, productive cough. Diarrhea/ vomiting. EXAM: CT CHEST, ABDOMEN AND PELVIS WITHOUT CONTRAST TECHNIQUE: Multidetector CT imaging of the chest, abdomen and pelvis was performed following the standard protocol without IV contrast. COMPARISON:  CT abdomen/ pelvis dated 06/25/2015. FINDINGS: CT CHEST FINDINGS Cardiovascular: Mild cardiomegaly.  Small pericardial effusion. Mild atherosclerotic calcifications of the aortic arch. 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 Mediastinum/Nodes: No suspicious mediastinal lymphadenopathy. Visualized thyroid is unremarkable. Lungs/Pleura: Small left and trace right pleural effusions. Mild ground-glass opacity/increased interstitial markings  in the bilateral lower lobes, favored to reflect atelectasis. No focal consolidation. No suspicious pulmonary nodules. No pneumothorax. Musculoskeletal: Visualized osseous structures are within normal limits. CT ABDOMEN PELVIS FINDINGS Hepatobiliary: Unenhanced liver is unremarkable. Suspected 1.6 cm noncalcified gallstone and layering gallbladder sludge (series 201/ image 89). No associated inflammatory changes. Pancreas: Within normal limits. Spleen: Within normal limits. Adrenals/Urinary Tract: Adrenal glands are within normal limits. Kidneys are within normal limits.  No hydronephrosis. Bladder is within normal limits. Stomach/Bowel: Stomach is within normal limits. Status post right hemicolectomy with appendectomy, better evaluated on prior CT. No evidence of bowel obstruction. Vascular/Lymphatic: No evidence of  abdominal aortic aneurysm. Atherosclerotic calcifications of the abdominal aorta and branch vessels. No suspicious abdominopelvic lymphadenopathy. Reproductive: Prostate is unremarkable. Other: No abdominopelvic ascites. Musculoskeletal: Mild degenerative changes at L3-4. 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 No evidence of thoracoabdominal aortic aneurysm. Please note that dissection cannot be excluded on unenhanced CT. Small pericardial effusion, new from 2016 CT abdomen/pelvis. Consider echocardiogram for further evaluation as clinically warranted. Small bilateral pleural effusions, left greater than right. Mild bilateral lower lobe atelectasis. Status post right hemicolectomy with appendectomy. No evidence of bowel obstruction. Electronically Signed   By: Julian Hy M.D.   On: 07/17/2016 14:53   Dg Chest 2 View  Result Date: 07/17/2016 CLINICAL DATA:  Chest pain.  Shortness of breath EXAM: CHEST  2 VIEW COMPARISON:  No prior. FINDINGS: Mediastinum and hilar structures normal. Cardiomegaly with normal pulmonary vascularity mild pulmonary interstitial prominence consistent congestive heart failure. Mild bibasilar atelectasis and/or scarring. No pleural effusion or pneumothorax. IMPRESSION: Cardiomegaly with pulmonary vascular prominence and bilateral interstitial prominence with Kerley B-lines. Small bilateral pleural effusions. Findings consistent mild congestive heart failure. Electronically Signed   By: Marcello Moores  Register   On: 07/17/2016 10:15   Ct Chest Wo Contrast  Result Date: 07/17/2016 CLINICAL DATA:  Left chest pain radiating to back, productive cough. Diarrhea/ vomiting. EXAM: CT CHEST, ABDOMEN AND PELVIS WITHOUT CONTRAST TECHNIQUE: Multidetector CT imaging of the chest, abdomen and pelvis was performed following the standard protocol without IV contrast. COMPARISON:  CT abdomen/ pelvis dated 06/25/2015. FINDINGS: CT CHEST FINDINGS Cardiovascular: Mild  cardiomegaly.  Small pericardial effusion. Mild atherosclerotic calcifications of the aortic arch. 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 Mediastinum/Nodes: No suspicious mediastinal lymphadenopathy. Visualized thyroid is unremarkable. Lungs/Pleura: Small left and trace right pleural effusions. Mild ground-glass opacity/increased interstitial markings in the bilateral lower lobes, favored to reflect atelectasis. No focal consolidation. No suspicious pulmonary nodules. No pneumothorax. Musculoskeletal: Visualized osseous structures are within normal limits. CT ABDOMEN PELVIS FINDINGS Hepatobiliary: Unenhanced liver is unremarkable. Suspected 1.6 cm noncalcified gallstone and layering gallbladder sludge (series 201/ image 89). No associated inflammatory changes. Pancreas: Within normal limits. Spleen: Within normal limits. Adrenals/Urinary Tract: Adrenal glands are within normal limits. Kidneys are within normal limits.  No hydronephrosis. Bladder is within normal limits. Stomach/Bowel: Stomach is within normal limits. Status post right hemicolectomy with appendectomy, better evaluated on prior CT. No evidence of bowel obstruction. Vascular/Lymphatic: No evidence of abdominal aortic aneurysm. Atherosclerotic calcifications of the abdominal aorta and branch vessels. No suspicious abdominopelvic lymphadenopathy. Reproductive: Prostate is unremarkable. Other: No abdominopelvic ascites. Musculoskeletal: Mild degenerative changes at L3-4. 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 No evidence of thoracoabdominal aortic aneurysm. Please note that dissection cannot be excluded on unenhanced CT. Small pericardial effusion, new from 2016 CT abdomen/pelvis. Consider echocardiogram for further evaluation as clinically warranted. Small bilateral pleural  effusions, left greater than right. Mild bilateral lower lobe atelectasis. Status post right hemicolectomy with appendectomy. No  evidence of bowel obstruction. Electronically Signed   By: Julian Hy M.D.   On: 07/17/2016 14:53   US Renal  Result Date: 07/18/2016 CLINICAL DATA:  Acute renal injury EXAM: RENAL / URINARY TRACT ULTRASOUND COMPLETE COMPARISON:  07/17/2016 CT FINDINGS: Right Kidney: Length: 10.2 cm. Echogenicity is isoechoic to that of adjacent liver. There is loss of cortical-medullary distinction. No nephrolithiasis. No mass or hydronephrosis visualized. Left Kidney: Length: 10.4 cm. Echogenicity is also increased similar to the contralateral kidney with loss of cortical-medullary distinction. There is a cyst laterally which appears complex with internal debris measuring approximately 8 mm. No mass or hydronephrosis visualized. Bladder: Appears normal for degree of bladder distention. IMPRESSION: Normal size kidneys bilaterally however there is loss of cortical-medullary distinction within both kidneys consistent with medical renal disease. Complex lateral left renal cyst measuring 8 mm. Electronically Signed   By: Ashley Royalty M.D.   On: 07/18/2016 23:50   Echo: 07/18/2016 - Left ventricle: The cavity size was normal. There was moderateconcentric hypertrophy. Systolic function was normal. Theestimated ejection fraction was in the range of 60% to 65%. Wallmotion was normal; there were no regional wall motionabnormalities. Features are consistent with a pseudonormal leftventricular filling pattern, with concomitant abnormal relaxationand increased filling pressure (grade 2 diastolic dysfunction). - Mitral valve: There was mild regurgitation. - Left atrium: The atrium was severely dilated. - Pulmonary arteries: PA peak pressure: 35 mm Hg (S). - Pericardium, extracardiac: A trivial, free-flowing pericardialeffusion was identified circumferential to the heart. The fluidhad no internal echoes. Impressions: - The right ventricular systolic pressure was increased consistent with mild pulmonary  hypertension  ECG:  10/03 SR, LVH w/ strain  PHYSICAL EXAM General: Well developed, thin, in no acute distress. Head: Normocephalic, atraumatic, sclera non-icteric, oropharynx is clear Neck: Negative for carotid bruits. JVD not elevated. No adenopathy Lungs: Clear bilaterally to auscultation without wheezes, rales, or rhonchi. Breathing is unlabored. Heart: RRR S1 S2 without  rub or gallops. Soft 2/6 systolic murmur Abdomen: Soft, non-tender, non-distended with normoactive bowel sounds. No hepatomegaly. No rebound/guarding. No obvious abdominal masses. Msk:  Strength and tone appears normal for age. Extremities: No clubbing, cyanosis or edema.  Distal pedal pulses are 2+ and equal bilaterally. Neuro: Alert and oriented X 3. Moves all extremities spontaneously. Psych:  Responds to questions appropriately with a normal affect.  ASSESSMENT AND PLAN: 1. Acute diastolic CHF secondary to hypertensive heart disease and uncontrolled HTN. Clinically improved. Echo with normal LV function. LAE. Optimal BP control is paramount. Appears euvolemic currently. 2. Elevated troponin secondary to demand ischemia. No further ischemic work up planned. 3. Acute on chronic renal failure. Nephrology to see. Renal US c/w medical renal disease. 4. Severe uncontrolled HTN. BP much improved. Currently on labetalol, Cardizem CD and Bidil. Adjustment per primary team. Compliance with medication is stressed.  5. Heroin abuse 6. Noncompliance.  Will sign off now. Please call with further questions.  Present on Admission: . Acute diastolic heart failure ([place]) . Elevated troponin   Signed, Jayma Volpi [place], [place] 07/19/2016 8:19 AM

## 2016-07-19 NOTE — Progress Notes (Signed)
PROGRESS NOTE    Timothy [name]  WCH:[id number] DOB: April 06, 1961 DOA: 07/17/2016 PCP: No PCP Per Patient   Outpatient Specialists:     Brief Narrative:  Timothy [name] is a 55 y.o. male who is a terribly poor historian, with medical history significant of BPH, "valve" issue, colon cancer?, and heroin abuse (snorting).  He presents to the ER with shortness of breath, worsening chest pain and coughing.  Patient states he last used heroin a few days ago and uses at least monthly.  Denies cocaine.  Has not been taking all his medications (lisinopril).  Moved here a few years ago from [facility].    Assessment & Plan:   Active Problems:   Acute diastolic heart failure (HCC)   AKI (acute kidney injury) ([place])   Heroin abuse   HTN (hypertension)   Elevated troponin   Hypertensive heart disease   Acute diastolic CHF-- diastolic on echo -cardiology consult placed -I/O Daily weights -diuresed well-- d/c lasix -appears to be euvolemic now  AKI -renal U/S shows medical renal disease -BMP in AM -appreciate nephrology  HTN -nitro gtt off -BP control per cards/nephro  Heroin abuse (snorts) -encouraged cessation  H/o colon cancer -does have evidence of hemicolectomy    DVT prophylaxis:  SQ Heparin  Code Status: Full Code   Family Communication: patient  Disposition Plan:  tx to tele bed   Consultants:   cardiology     Subjective: No complaints, feeling better  Objective: Vitals:   07/19/16 0700 07/19/16 0819 07/19/16 1000 07/19/16 1220  BP: 130/81 136/83 (!) 142/89 131/89  Pulse: 71 68 66 66  Resp: 16 16 18 12   Temp:  97.6 F (36.4 C)  97.8 F (36.6 C)  TempSrc:  Oral  Oral  SpO2: 93% 98% 100% 97%  Weight:      Height:        Intake/Output Summary (Last 24 hours) at 07/19/16 1430 Last data filed at 07/19/16 1100  Gross per 24 hour  Intake             1358 ml  Output             1050 ml  Net              308 ml   Filed Weights   07/17/16 1800 07/18/16 0404 07/19/16 0309  Weight: 74.3 kg (163 lb 12.8 oz) 68 kg (149 lb 14.6 oz) 75 kg (165 lb 5.5 oz)    Examination:  General exam: Appears calm and comfortable  Respiratory system: clear, no wheezing Cardiovascular system: S1 & S2 heard, RRR.   + murmurs, rubs, gallops or clicks. No pedal edema. Gastrointestinal system: Abdomen is nondistended, soft and nontender. No organomegaly or masses felt. Normal bowel sounds heard. Central nervous system: Alert and oriented. No focal neurological deficits. Extremities: Symmetric 5 x 5 power.     Data Reviewed: I have personally reviewed following labs and imaging studies  CBC:  Recent Labs Lab 07/17/16 1133 07/18/16 0734 07/19/16 0240  WBC 7.8 9.3 6.8  HGB 12.4* 11.0* 10.1*  HCT 37.9* 32.9* 30.7*  MCV 88.6 87.0 87.2  PLT 231 195 353   Basic Metabolic Panel:  Recent Labs Lab 07/17/16 1133 07/18/16 0734 07/19/16 0240  NA 139 134* 133*  K 3.4* 2.7* 3.3*  CL 102 100* 96*  CO2 26 25 26   GLUCOSE 108* 156* 124*  BUN 49* 51* 55*  CREATININE 4.88* 4.94* 5.72*  CALCIUM 8.6* 8.2* 8.2*  MG  --   --  2.1   GFR: Estimated Creatinine Clearance: 15.5 mL/min (by C-G formula based on SCr of 5.72 mg/dL (H)). Liver Function Tests: No results for input(s): AST, ALT, ALKPHOS, BILITOT, PROT, ALBUMIN in the last 168 hours. No results for input(s): LIPASE, AMYLASE in the last 168 hours. No results for input(s): AMMONIA in the last 168 hours. Coagulation Profile: No results for input(s): INR, PROTIME in the last 168 hours. Cardiac Enzymes:  Recent Labs Lab 07/17/16 1841 07/18/16 0129 07/18/16 0734  TROPONINI 0.17* 0.15* 0.14*   BNP (last 3 results) No results for input(s): PROBNP in the last 8760 hours. HbA1C: No results for input(s): HGBA1C in the last 72 hours. CBG: No results for input(s): GLUCAP in the last 168 hours. Lipid Profile: No results for input(s): CHOL, HDL, LDLCALC, TRIG, CHOLHDL, LDLDIRECT in  the last 72 hours. Thyroid Function Tests: No results for input(s): TSH, T4TOTAL, FREET4, T3FREE, THYROIDAB in the last 72 hours. Anemia Panel: No results for input(s): VITAMINB12, FOLATE, FERRITIN, TIBC, IRON, RETICCTPCT in the last 72 hours. Urine analysis: No results found for: COLORURINE, APPEARANCEUR, LABSPEC, [place], GLUCOSEU, HGBUR, BILIRUBINUR, [place], PROTEINUR, UROBILINOGEN, NITRITE, LEUKOCYTESUR    Recent Results (from the past 240 hour(s))  MRSA PCR Screening     Status: None   Collection Time: 07/17/16  6:47 PM  Result Value Ref Range Status   MRSA by PCR NEGATIVE NEGATIVE Final    Comment:        The GeneXpert MRSA Assay (FDA approved for NASAL specimens only), is one component of a comprehensive MRSA colonization surveillance program. It is not intended to diagnose MRSA infection nor to guide or monitor treatment for MRSA infections.       Anti-infectives    None       Radiology Studies: Ct Abdomen Pelvis Wo Contrast  Result Date: 07/17/2016 CLINICAL DATA:  Left chest pain radiating to back, productive cough. Diarrhea/ vomiting. EXAM: CT CHEST, ABDOMEN AND PELVIS WITHOUT CONTRAST TECHNIQUE: Multidetector CT imaging of the chest, abdomen and pelvis was performed following the standard protocol without IV contrast. COMPARISON:  CT abdomen/ pelvis dated 06/25/2015. FINDINGS: CT CHEST FINDINGS Cardiovascular: Mild cardiomegaly.  Small pericardial effusion. Mild atherosclerotic calcifications of the aortic arch. 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 Mediastinum/Nodes: No suspicious mediastinal lymphadenopathy. Visualized thyroid is unremarkable. Lungs/Pleura: Small left and trace right pleural effusions. Mild ground-glass opacity/increased interstitial markings in the bilateral lower lobes, favored to reflect atelectasis. No focal consolidation. No suspicious pulmonary nodules. No pneumothorax. Musculoskeletal: Visualized osseous structures are  within normal limits. CT ABDOMEN PELVIS FINDINGS Hepatobiliary: Unenhanced liver is unremarkable. Suspected 1.6 cm noncalcified gallstone and layering gallbladder sludge (series 201/ image 89). No associated inflammatory changes. Pancreas: Within normal limits. Spleen: Within normal limits. Adrenals/Urinary Tract: Adrenal glands are within normal limits. Kidneys are within normal limits.  No hydronephrosis. Bladder is within normal limits. Stomach/Bowel: Stomach is within normal limits. Status post right hemicolectomy with appendectomy, better evaluated on prior CT. No evidence of bowel obstruction. Vascular/Lymphatic: No evidence of abdominal aortic aneurysm. Atherosclerotic calcifications of the abdominal aorta and branch vessels. No suspicious abdominopelvic lymphadenopathy. Reproductive: Prostate is unremarkable. Other: No abdominopelvic ascites. Musculoskeletal: Mild degenerative changes at L3-4. 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 No evidence of thoracoabdominal aortic aneurysm. Please note that dissection cannot be excluded on unenhanced CT. Small pericardial effusion, new from 2016 CT abdomen/pelvis. Consider echocardiogram for further evaluation as clinically warranted. Small bilateral pleural effusions, left greater than right. Mild bilateral  lower lobe atelectasis. Status post right hemicolectomy with appendectomy. No evidence of bowel obstruction. Electronically Signed   By: Julian Hy M.D.   On: 07/17/2016 14:53   Ct Chest Wo Contrast  Result Date: 07/17/2016 CLINICAL DATA:  Left chest pain radiating to back, productive cough. Diarrhea/ vomiting. EXAM: CT CHEST, ABDOMEN AND PELVIS WITHOUT CONTRAST TECHNIQUE: Multidetector CT imaging of the chest, abdomen and pelvis was performed following the standard protocol without IV contrast. COMPARISON:  CT abdomen/ pelvis dated 06/25/2015. FINDINGS: CT CHEST FINDINGS Cardiovascular: Mild cardiomegaly.  Small  pericardial effusion. Mild atherosclerotic calcifications of the aortic arch. 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 Mediastinum/Nodes: No suspicious mediastinal lymphadenopathy. Visualized thyroid is unremarkable. Lungs/Pleura: Small left and trace right pleural effusions. Mild ground-glass opacity/increased interstitial markings in the bilateral lower lobes, favored to reflect atelectasis. No focal consolidation. No suspicious pulmonary nodules. No pneumothorax. Musculoskeletal: Visualized osseous structures are within normal limits. CT ABDOMEN PELVIS FINDINGS Hepatobiliary: Unenhanced liver is unremarkable. Suspected 1.6 cm noncalcified gallstone and layering gallbladder sludge (series 201/ image 89). No associated inflammatory changes. Pancreas: Within normal limits. Spleen: Within normal limits. Adrenals/Urinary Tract: Adrenal glands are within normal limits. Kidneys are within normal limits.  No hydronephrosis. Bladder is within normal limits. Stomach/Bowel: Stomach is within normal limits. Status post right hemicolectomy with appendectomy, better evaluated on prior CT. No evidence of bowel obstruction. Vascular/Lymphatic: No evidence of abdominal aortic aneurysm. Atherosclerotic calcifications of the abdominal aorta and branch vessels. No suspicious abdominopelvic lymphadenopathy. Reproductive: Prostate is unremarkable. Other: No abdominopelvic ascites. Musculoskeletal: Mild degenerative changes at L3-4. 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 No evidence of thoracoabdominal aortic aneurysm. Please note that dissection cannot be excluded on unenhanced CT. Small pericardial effusion, new from 2016 CT abdomen/pelvis. Consider echocardiogram for further evaluation as clinically warranted. Small bilateral pleural effusions, left greater than right. Mild bilateral lower lobe atelectasis. Status post right hemicolectomy with appendectomy. No evidence of bowel  obstruction. Electronically Signed   By: Julian Hy M.D.   On: 07/17/2016 14:53   US Renal  Result Date: 07/18/2016 CLINICAL DATA:  Acute renal injury EXAM: RENAL / URINARY TRACT ULTRASOUND COMPLETE COMPARISON:  07/17/2016 CT FINDINGS: Right Kidney: Length: 10.2 cm. Echogenicity is isoechoic to that of adjacent liver. There is loss of cortical-medullary distinction. No nephrolithiasis. No mass or hydronephrosis visualized. Left Kidney: Length: 10.4 cm. Echogenicity is also increased similar to the contralateral kidney with loss of cortical-medullary distinction. There is a cyst laterally which appears complex with internal debris measuring approximately 8 mm. No mass or hydronephrosis visualized. Bladder: Appears normal for degree of bladder distention. IMPRESSION: Normal size kidneys bilaterally however there is loss of cortical-medullary distinction within both kidneys consistent with medical renal disease. Complex lateral left renal cyst measuring 8 mm. Electronically Signed   By: Ashley Royalty M.D.   On: 07/18/2016 23:50        Scheduled Meds: . folic acid  1 mg Oral Daily  . heparin  5,000 Units Subcutaneous Q8H  . isosorbide-hydrALAZINE  1 tablet Oral BID  . labetalol  100 mg Oral BID  . multivitamin with minerals  1 tablet Oral Daily  . potassium chloride  40 mEq Oral Daily  . sodium chloride flush  3 mL Intravenous Q12H  . spironolactone  12.5 mg Oral BID  . tamsulosin  0.4 mg Oral Daily  . thiamine  100 mg Oral Daily   Continuous Infusions:     LOS: 2 days  Time spent: 25 min    [place], DO [facility] Pager [phone]  If 7PM-7AM, please contact night-coverage [email or web address] Password [email or web address] 07/19/2016, 2:30 PM

## 2016-07-20 LAB — RENAL FUNCTION PANEL
ANION GAP: 17 — AB (ref 5–15)
Albumin: 2.9 g/dL — ABNORMAL LOW (ref 3.5–5.0)
BUN: 57 mg/dL — ABNORMAL HIGH (ref 6–20)
CHLORIDE: 94 mmol/L — AB (ref 101–111)
CO2: 21 mmol/L — ABNORMAL LOW (ref 22–32)
CREATININE: 5.85 mg/dL — AB (ref 0.61–1.24)
Calcium: 8.4 mg/dL — ABNORMAL LOW (ref 8.9–10.3)
GFR, EST AFRICAN AMERICAN: 11 mL/min — AB (ref >60)
GFR, EST NON AFRICAN AMERICAN: 10 mL/min — AB (ref >60)
Glucose, Bld: 111 mg/dL — ABNORMAL HIGH (ref 65–99)
POTASSIUM: 4.3 mmol/L (ref 3.5–5.1)
Phosphorus: 5.3 mg/dL — ABNORMAL HIGH (ref 2.5–4.6)
Sodium: 132 mmol/L — ABNORMAL LOW (ref 135–145)

## 2016-07-20 MED ORDER — NICOTINE 21 MG/24HR TD PT24
21.0000 mg | MEDICATED_PATCH | Freq: Every day | TRANSDERMAL | Status: DC
  Administered 2016-07-20 – 2016-07-21 (×3): 21 mg via TRANSDERMAL
  Filled 2016-07-20 (×4): qty 1

## 2016-07-20 MED ORDER — POLYETHYLENE GLYCOL 3350 17 G PO PACK
17.0000 g | PACK | Freq: Every day | ORAL | Status: DC
  Administered 2016-07-20 – 2016-07-21 (×2): 17 g via ORAL
  Filled 2016-07-20 (×2): qty 1

## 2016-07-20 MED ORDER — SENNOSIDES-DOCUSATE SODIUM 8.6-50 MG PO TABS
1.0000 | ORAL_TABLET | Freq: Two times a day (BID) | ORAL | Status: DC
  Administered 2016-07-20 – 2016-07-21 (×4): 1 via ORAL
  Filled 2016-07-20 (×5): qty 1

## 2016-07-20 NOTE — Progress Notes (Signed)
PROGRESS NOTE    Timothy [name]  SAY:[id number] DOB: 14-Jun-1961 DOA: 07/17/2016 PCP: No PCP Per Patient   Outpatient Specialists:     Brief Narrative:  Timothy [name] is a 55 y.o. male who is a terribly poor historian, with medical history significant of BPH, "valve" issue, colon cancer?, and heroin abuse (snorting).  He presents to the ER with shortness of breath, worsening chest pain and coughing.  Patient states he last used heroin a few days ago and uses at least monthly.  Denies cocaine.  Has not been taking all his medications (lisinopril).  Moved here a few years ago from [facility].    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 -renal U/S shows medical renal disease -BMP in AM -appreciate nephrology  HTN -BP control per cards/nephro-- much improved  Heroin abuse (snorts) -encouraged cessation  H/o colon cancer -does have evidence of hemicolectomy    DVT prophylaxis:  SQ Heparin  Code Status: Full Code   Family Communication: patient  Disposition Plan:  tx to tele bed   Consultants:   cardiology     Subjective: Patient smoking in the bathroom  Objective: Vitals:   07/19/16 1608 07/19/16 1813 07/19/16 2129 07/20/16 1009  BP: 116/90 123/79 133/79 (!) 148/100  Pulse: 64 71 75 68  Resp: 14 16 (!) 21   Temp:  97.7 F (36.5 C) 98.4 F (36.9 C)   TempSrc:  Oral Oral   SpO2: 98% 99% 98%   Weight:  76.7 kg (169 lb)    Height:  6\' 1"  (1.854 m)      Intake/Output Summary (Last 24 hours) at 07/20/16 1215 Last data filed at 07/20/16 0900  Gross per 24 hour  Intake              360 ml  Output               925 ml  Net             -565 ml   Filed Weights   07/18/16 0404 07/19/16 0309 07/19/16 1813  Weight: 68 kg (149 lb 14.6 oz) 75 kg (165 lb 5.5 oz) 76.7 kg (169 lb)    Examination:  General exam: Appears calm and comfortable  Respiratory system: clear, no wheezing Cardiovascular system: S1 & S2 heard, RRR.   + murmurs, rubs, gallops or clicks. No pedal edema. Gastrointestinal system: Abdomen is nondistended, soft and nontender. No organomegaly or masses felt. Normal bowel sounds heard. Central nervous system: Alert and oriented. No focal neurological deficits. Extremities: Symmetric 5 x 5 power.     Data Reviewed: I have personally reviewed following labs and imaging studies  CBC:  Recent Labs Lab 07/17/16 1133 07/18/16 0734 07/19/16 0240  WBC 7.8 9.3 6.8  HGB 12.4* 11.0* 10.1*  HCT 37.9* 32.9* 30.7*  MCV 88.6 87.0 87.2  PLT 231 195 235   Basic Metabolic Panel:  Recent Labs Lab 07/17/16 1133 07/18/16 0734 07/19/16 0240 07/20/16 0440  NA 139 134* 133* 132*  K 3.4* 2.7* 3.3* 4.3  CL 102 100* 96* 94*  CO2 26 25 26  21*  GLUCOSE  108* 156* 124* 111*  BUN 49* 51* 55* 57*  CREATININE 4.88* 4.94* 5.72* 5.85*  CALCIUM 8.6* 8.2* 8.2* 8.4*  MG  --   --  2.1  --   PHOS  --   --   --  5.3*   GFR: Estimated Creatinine Clearance: 15.5 mL/min (by C-G formula based on SCr of 5.85 mg/dL (H)). Liver Function Tests:  Recent Labs Lab 07/20/16 0440  ALBUMIN 2.9*   No results for input(s): LIPASE, AMYLASE in the last 168 hours. No results for input(s): AMMONIA in the last 168 hours. Coagulation Profile: No results for input(s): INR, PROTIME in the last 168 hours. Cardiac Enzymes:  Recent Labs Lab 07/17/16 1841 07/18/16 0129 07/18/16 0734  TROPONINI 0.17* 0.15* 0.14*   BNP (last 3 results) No results for input(s): PROBNP in the last 8760 hours. HbA1C: No results for input(s): HGBA1C in the last 72 hours. CBG: No results for input(s): GLUCAP in the last 168  hours. Lipid Profile: No results for input(s): CHOL, HDL, LDLCALC, TRIG, CHOLHDL, LDLDIRECT in the last 72 hours. Thyroid Function Tests: No results for input(s): TSH, T4TOTAL, FREET4, T3FREE, THYROIDAB in the last 72 hours. Anemia Panel: No results for input(s): VITAMINB12, FOLATE, FERRITIN, TIBC, IRON, RETICCTPCT in the last 72 hours. Urine analysis:    Component Value Date/Time   COLORURINE YELLOW 07/19/2016 2027   APPEARANCEUR CLEAR 07/19/2016 2027   LABSPEC 1.013 07/19/2016 2027   PHURINE 5.5 07/19/2016 2027   GLUCOSEU NEGATIVE 07/19/2016 2027   HGBUR NEGATIVE 07/19/2016 2027   BILIRUBINUR NEGATIVE 07/19/2016 2027   [place] NEGATIVE 07/19/2016 2027   PROTEINUR 100 (A) 07/19/2016 2027   NITRITE NEGATIVE 07/19/2016 2027   LEUKOCYTESUR TRACE (A) 07/19/2016 2027      Recent Results (from the past 240 hour(s))  MRSA PCR Screening     Status: None   Collection Time: 07/17/16  6:47 PM  Result Value Ref Range Status   MRSA by PCR NEGATIVE NEGATIVE Final    Comment:        The GeneXpert MRSA Assay (FDA approved for NASAL specimens only), is one component of a comprehensive MRSA colonization surveillance program. It is not intended to diagnose MRSA infection nor to guide or monitor treatment for MRSA infections.       Anti-infectives    None       Radiology Studies: US Renal  Result Date: 07/18/2016 CLINICAL DATA:  Acute renal injury EXAM: RENAL / URINARY TRACT ULTRASOUND COMPLETE COMPARISON:  07/17/2016 CT FINDINGS: Right Kidney: Length: 10.2 cm. Echogenicity is isoechoic to that of adjacent liver. There is loss of cortical-medullary distinction. No nephrolithiasis. No mass or hydronephrosis visualized. Left Kidney: Length: 10.4 cm. Echogenicity is also increased similar to the contralateral kidney with loss of cortical-medullary distinction. There is a cyst laterally which appears complex with internal debris measuring approximately 8 mm. No mass or hydronephrosis  visualized. Bladder: Appears normal for degree of bladder distention. IMPRESSION: Normal size kidneys bilaterally however there is loss of cortical-medullary distinction within both kidneys consistent with medical renal disease. Complex lateral left renal cyst measuring 8 mm. Electronically Signed   By: Ashley Royalty M.D.   On: 07/18/2016 23:50   Dg Chest Port 1 View  Result Date: 07/19/2016 CLINICAL DATA:  Shortness of breath EXAM: PORTABLE CHEST 1 VIEW COMPARISON:  Chest x-ray of 07/17/2016 FINDINGS: There is little change in cardiomegaly and probable pulmonary vascular congestion. No focal infiltrate is seen and no definite pleural effusion is noted. No bony  abnormality is seen. IMPRESSION: No significant change in cardiomegaly and probable pulmonary vascular congestion. Electronically Signed   By: Ivar Drape M.D.   On: 07/19/2016 16:33        Scheduled Meds: . folic acid  1 mg Oral Daily  . heparin  5,000 Units Subcutaneous Q8H  . isosorbide-hydrALAZINE  1 tablet Oral BID  . labetalol  100 mg Oral BID  . multivitamin with minerals  1 tablet Oral Daily  . nicotine  21 mg Transdermal Daily  . polyethylene glycol  17 g Oral Daily  . senna-docusate  1 tablet Oral BID  . sodium chloride flush  3 mL Intravenous Q12H  . spironolactone  12.5 mg Oral BID  . tamsulosin  0.4 mg Oral Daily  . thiamine  100 mg Oral Daily   Continuous Infusions:     LOS: 3 days    Time spent: 25 min    [place], DO [facility] Pager [phone]  If 7PM-7AM, please contact night-coverage [email or web address] Password [email or web address] 07/20/2016, 12:15 PM

## 2016-07-20 NOTE — Progress Notes (Signed)
Subjective:  Moved to floor bed- no BP's recorded since last night- had 1200 of urine- crt up but only sightly.  I checked BP lying and sitting was 140/90 and 150/100 respectively but no meds yet this AM Objective Vital signs in last 24 hours: Vitals:   07/19/16 1552 07/19/16 1608 07/19/16 1813 07/19/16 2129  BP: 121/85 116/90 123/79 133/79  Pulse: 72 64 71 75  Resp: 16 14 16  (!) 21  Temp: 97.9 F (36.6 C)  97.7 F (36.5 C) 98.4 F (36.9 C)  TempSrc: Oral  Oral Oral  SpO2: 97% 98% 99% 98%  Weight:   76.7 kg (169 lb)   Height:   6\' 1"  (1.854 m)    Weight change: 1.658 kg (3 lb 10.5 oz)  Intake/Output Summary (Last 24 hours) at 07/20/16 0854 Last data filed at 07/20/16 0400  Gross per 24 hour  Intake              360 ml  Output             1200 ml  Net             -840 ml    Assessment/Plan: 55 year old black male with long-standing poorly controlled hypertension now presenting with acute on chronic renal failure  1.Renal- patient's baseline renal function is not completely normal. He now presents with acute on chronic renal failure in the setting of malignant hypertension/ missing blood pressure medications as well as NSAID use. He tells me that his sister is on dialysis which would put him at increased risk for renal failure as well. I have told him that he needs to stop taking NSAIDs and needs to make an effort at blood pressure control as that is the only thing that could possibly put the prospect of dialysis further of the future for him. He needs to stop with his lifestyle choices of tobacco alcohol and heroin as well.  U/A not consistent with GN. Ultrasound no obsrtruction. Maybe creatinine trying to plateau 2. Hypertension/volume  - presenting with very high blood pressure. It was corrected perhaps too quickly so could've given him some ATN and that is what is responsible for his most recent climbing creatinine.  simplify his blood pressure regimen. Now on labetalol 100 BID, bidil  BID and aldactone 12.5 BID- need to see what BP does on that reg- allow him to be more mobile and follow  3. Hypokalemia- on repletion. Aldactone has helped- will stop kdur 4. Anemia  - not a significant issue at this time. We'll follow   Ladonne Sharples A    Labs: Basic Metabolic Panel:  Recent Labs Lab 07/18/16 0734 07/19/16 0240 07/20/16 0440  NA 134* 133* 132*  K 2.7* 3.3* 4.3  CL 100* 96* 94*  CO2 25 26 21*  GLUCOSE 156* 124* 111*  BUN 51* 55* 57*  CREATININE 4.94* 5.72* 5.85*  CALCIUM 8.2* 8.2* 8.4*  PHOS  --   --  5.3*   Liver Function Tests:  Recent Labs Lab 07/20/16 0440  ALBUMIN 2.9*   No results for input(s): LIPASE, AMYLASE in the last 168 hours. No results for input(s): AMMONIA in the last 168 hours. CBC:  Recent Labs Lab 07/17/16 1133 07/18/16 0734 07/19/16 0240  WBC 7.8 9.3 6.8  HGB 12.4* 11.0* 10.1*  HCT 37.9* 32.9* 30.7*  MCV 88.6 87.0 87.2  PLT 231 195 205   Cardiac Enzymes:  Recent Labs Lab 07/17/16 1841 07/18/16 0129 07/18/16 0734  TROPONINI 0.17* 0.15* 0.14*  CBG: No results for input(s): GLUCAP in the last 168 hours.  Iron Studies: No results for input(s): IRON, TIBC, TRANSFERRIN, FERRITIN in the last 72 hours. Studies/Results: US Renal  Result Date: 07/18/2016 CLINICAL DATA:  Acute renal injury EXAM: RENAL / URINARY TRACT ULTRASOUND COMPLETE COMPARISON:  07/17/2016 CT FINDINGS: Right Kidney: Length: 10.2 cm. Echogenicity is isoechoic to that of adjacent liver. There is loss of cortical-medullary distinction. No nephrolithiasis. No mass or hydronephrosis visualized. Left Kidney: Length: 10.4 cm. Echogenicity is also increased similar to the contralateral kidney with loss of cortical-medullary distinction. There is a cyst laterally which appears complex with internal debris measuring approximately 8 mm. No mass or hydronephrosis visualized. Bladder: Appears normal for degree of bladder distention. IMPRESSION: Normal size  kidneys bilaterally however there is loss of cortical-medullary distinction within both kidneys consistent with medical renal disease. Complex lateral left renal cyst measuring 8 mm. Electronically Signed   By: Ashley Royalty M.D.   On: 07/18/2016 23:50   Dg Chest Port 1 View  Result Date: 07/19/2016 CLINICAL DATA:  Shortness of breath EXAM: PORTABLE CHEST 1 VIEW COMPARISON:  Chest x-ray of 07/17/2016 FINDINGS: There is little change in cardiomegaly and probable pulmonary vascular congestion. No focal infiltrate is seen and no definite pleural effusion is noted. No bony abnormality is seen. IMPRESSION: No significant change in cardiomegaly and probable pulmonary vascular congestion. Electronically Signed   By: Ivar Drape M.D.   On: 07/19/2016 16:33   Medications: Infusions:    Scheduled Medications: . folic acid  1 mg Oral Daily  . heparin  5,000 Units Subcutaneous Q8H  . isosorbide-hydrALAZINE  1 tablet Oral BID  . labetalol  100 mg Oral BID  . multivitamin with minerals  1 tablet Oral Daily  . potassium chloride  40 mEq Oral Daily  . sodium chloride flush  3 mL Intravenous Q12H  . spironolactone  12.5 mg Oral BID  . tamsulosin  0.4 mg Oral Daily  . thiamine  100 mg Oral Daily    have reviewed scheduled and prn medications.  Physical Exam: General: NAD Heart: RRR Lungs: clear Abdomen: soft, non tender Extremities: no signif edema    07/20/2016,8:54 AM  LOS: 3 days

## 2016-07-21 LAB — RENAL FUNCTION PANEL
Albumin: 2.5 g/dL — ABNORMAL LOW (ref 3.5–5.0)
Anion gap: 13 (ref 5–15)
BUN: 56 mg/dL — AB (ref 6–20)
CHLORIDE: 97 mmol/L — AB (ref 101–111)
CO2: 24 mmol/L (ref 22–32)
CREATININE: 5.63 mg/dL — AB (ref 0.61–1.24)
Calcium: 8.4 mg/dL — ABNORMAL LOW (ref 8.9–10.3)
GFR calc Af Amer: 12 mL/min — ABNORMAL LOW (ref >60)
GFR calc non Af Amer: 10 mL/min — ABNORMAL LOW (ref >60)
Glucose, Bld: 87 mg/dL (ref 65–99)
POTASSIUM: 4.1 mmol/L (ref 3.5–5.1)
Phosphorus: 5.1 mg/dL — ABNORMAL HIGH (ref 2.5–4.6)
Sodium: 134 mmol/L — ABNORMAL LOW (ref 135–145)

## 2016-07-21 MED ORDER — SPIRONOLACTONE 25 MG PO TABS
25.0000 mg | ORAL_TABLET | Freq: Two times a day (BID) | ORAL | Status: DC
  Administered 2016-07-21 – 2016-07-22 (×3): 25 mg via ORAL
  Filled 2016-07-21 (×3): qty 1

## 2016-07-21 MED ORDER — ZOLPIDEM TARTRATE 5 MG PO TABS
5.0000 mg | ORAL_TABLET | Freq: Every evening | ORAL | Status: DC | PRN
  Administered 2016-07-22: 5 mg via ORAL
  Filled 2016-07-21: qty 1

## 2016-07-21 MED ORDER — SORBITOL 70 % SOLN
30.0000 mL | Freq: Every day | Status: DC | PRN
  Administered 2016-07-21: 30 mL via ORAL
  Filled 2016-07-21: qty 30

## 2016-07-21 MED ORDER — MAGNESIUM HYDROXIDE 400 MG/5ML PO SUSP: 30.0000 mL | Freq: Every day | ORAL | Status: DC | PRN

## 2016-07-21 NOTE — Progress Notes (Signed)
PROGRESS NOTE    Timothy [name]  LEX:[id number] DOB: 1961/08/03 DOA: 07/17/2016 PCP: No PCP Per Patient   Outpatient Specialists:     Brief Narrative:  Timothy [name] is a 55 y.o. male who is a terribly poor historian, with medical history significant of BPH, "valve" issue, colon cancer?, and heroin abuse (snorting).  He presents to the ER with shortness of breath, worsening chest pain and coughing.  Patient states he last used heroin a few days ago and uses at least monthly.  Denies cocaine.  Has not been taking all his medications (lisinopril).  Moved here a few years ago from [facility].    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 -renal U/S shows medical renal disease -BMP in AM -appreciate nephrology-- if CR stable and BP stable in AM can go home  HTN -BP control per cards/nephro-- much improved  Heroin abuse (snorts) -encouraged cessation  H/o colon cancer -does have evidence of hemicolectomy    DVT prophylaxis:  SQ Heparin  Code Status: Full Code   Family Communication: patient  Disposition Plan:  Home tomm  Consultants:   cardiology     Subjective: Up walking around  Objective: Vitals:   07/20/16 2112 07/21/16 0444 07/21/16 0934 07/21/16 1100  BP: (!) 165/105 (!) 164/90 (!) 200/137 (!) 146/98  Pulse: 71 67 75   Resp: 18 10    Temp: 98.2 F (36.8 C) 98.4 F (36.9 C)    TempSrc: Oral Oral    SpO2: 97% 98%    Weight:  75.6 kg (166 lb 11.2 oz)    Height:        Intake/Output Summary (Last 24 hours) at 07/21/16 1217 Last data filed at 07/21/16 0500  Gross per 24 hour  Intake              840  ml  Output                0 ml  Net              840 ml   Filed Weights   07/19/16 0309 07/19/16 1813 07/21/16 0444  Weight: 75 kg (165 lb 5.5 oz) 76.7 kg (169 lb) 75.6 kg (166 lb 11.2 oz)    Examination:  General exam: Appears calm and comfortable - up walking around Respiratory system: clear, no wheezing Cardiovascular system: S1 & S2 heard, RRR.   + murmurs, rubs, gallops or clicks. No pedal edema. Gastrointestinal system: Abdomen is nondistended, soft and nontender. No organomegaly or masses felt. Normal bowel sounds heard. Central nervous system: Alert and oriented. No focal neurological deficits. Extremities: Symmetric 5 x 5 power.     Data Reviewed: I have personally reviewed following labs and imaging studies  CBC:  Recent Labs Lab 07/17/16 1133 07/18/16 0734 07/19/16 0240  WBC 7.8 9.3 6.8  HGB 12.4* 11.0* 10.1*  HCT 37.9* 32.9* 30.7*  MCV 88.6 87.0 87.2  PLT 231 195 449   Basic Metabolic Panel:  Recent Labs Lab 07/17/16 1133 07/18/16 0734 07/19/16 0240 07/20/16 0440 07/21/16 0352  NA 139 134* 133* 132* 134*  K 3.4* 2.7*  3.3* 4.3 4.1  CL 102 100* 96* 94* 97*  CO2 26 25 26  21* 24  GLUCOSE 108* 156* 124* 111* 87  BUN 49* 51* 55* 57* 56*  CREATININE 4.88* 4.94* 5.72* 5.85* 5.63*  CALCIUM 8.6* 8.2* 8.2* 8.4* 8.4*  MG  --   --  2.1  --   --   PHOS  --   --   --  5.3* 5.1*   GFR: Estimated Creatinine Clearance: 15.9 mL/min (by C-G formula based on SCr of 5.63 mg/dL (H)). Liver Function Tests:  Recent Labs Lab 07/20/16 0440 07/21/16 0352  ALBUMIN 2.9* 2.5*   No results for input(s): LIPASE, AMYLASE in the last 168 hours. No results for input(s): AMMONIA in the last 168 hours. Coagulation Profile: No results for input(s): INR, PROTIME in the last 168 hours. Cardiac Enzymes:  Recent Labs Lab 07/17/16 1841 07/18/16 0129 07/18/16 0734  TROPONINI 0.17* 0.15* 0.14*   BNP (last 3 results) No results for input(s): PROBNP in the last 8760  hours. HbA1C: No results for input(s): HGBA1C in the last 72 hours. CBG: No results for input(s): GLUCAP in the last 168 hours. Lipid Profile: No results for input(s): CHOL, HDL, LDLCALC, TRIG, CHOLHDL, LDLDIRECT in the last 72 hours. Thyroid Function Tests: No results for input(s): TSH, T4TOTAL, FREET4, T3FREE, THYROIDAB in the last 72 hours. Anemia Panel: No results for input(s): VITAMINB12, FOLATE, FERRITIN, TIBC, IRON, RETICCTPCT in the last 72 hours. Urine analysis:    Component Value Date/Time   COLORURINE YELLOW 07/19/2016 2027   APPEARANCEUR CLEAR 07/19/2016 2027   LABSPEC 1.013 07/19/2016 2027   PHURINE 5.5 07/19/2016 2027   GLUCOSEU NEGATIVE 07/19/2016 2027   HGBUR NEGATIVE 07/19/2016 2027   BILIRUBINUR NEGATIVE 07/19/2016 2027   Man NEGATIVE 07/19/2016 2027   PROTEINUR 100 (A) 07/19/2016 2027   NITRITE NEGATIVE 07/19/2016 2027   LEUKOCYTESUR TRACE (A) 07/19/2016 2027      Recent Results (from the past 240 hour(s))  MRSA PCR Screening     Status: None   Collection Time: 07/17/16  6:47 PM  Result Value Ref Range Status   MRSA by PCR NEGATIVE NEGATIVE Final    Comment:        The GeneXpert MRSA Assay (FDA approved for NASAL specimens only), is one component of a comprehensive MRSA colonization surveillance program. It is not intended to diagnose MRSA infection nor to guide or monitor treatment for MRSA infections.       Anti-infectives    None       Radiology Studies: Dg Chest Port 1 View  Result Date: 07/19/2016 CLINICAL DATA:  Shortness of breath EXAM: PORTABLE CHEST 1 VIEW COMPARISON:  Chest x-ray of 07/17/2016 FINDINGS: There is little change in cardiomegaly and probable pulmonary vascular congestion. No focal infiltrate is seen and no definite pleural effusion is noted. No bony abnormality is seen. IMPRESSION: No significant change in cardiomegaly and probable pulmonary vascular congestion. Electronically Signed   By: Ivar Drape M.D.   On:  07/19/2016 16:33        Scheduled Meds: . folic acid  1 mg Oral Daily  . heparin  5,000 Units Subcutaneous Q8H  . isosorbide-hydrALAZINE  1 tablet Oral BID  . labetalol  100 mg Oral BID  . multivitamin with minerals  1 tablet Oral Daily  . nicotine  21 mg Transdermal Daily  . polyethylene glycol  17 g Oral Daily  . senna-docusate  1 tablet Oral BID  . sodium chloride flush  3  mL Intravenous Q12H  . spironolactone  25 mg Oral BID  . tamsulosin  0.4 mg Oral Daily  . thiamine  100 mg Oral Daily   Continuous Infusions:     LOS: 4 days    Time spent: 25 min    [place], DO [facility] Pager [phone]  If 7PM-7AM, please contact night-coverage [email or web address] Password [email or web address] 07/21/2016, 12:17 PM

## 2016-07-21 NOTE — Progress Notes (Addendum)
Subjective:  BP's higher than previous- only 425 of urine recorded but shift missed- kidney function stable to slightly improved.  C/o continued back pain- is muscular and sweating   Objective Vital signs in last 24 hours: Vitals:   07/20/16 1009 07/20/16 1430 07/20/16 2112 07/21/16 0444  BP: (!) 148/100 (!) 153/92 (!) 165/105 (!) 164/90  Pulse: 68 79 71 67  Resp:  16 18 10   Temp:  98.7 F (37.1 C) 98.2 F (36.8 C) 98.4 F (36.9 C)  TempSrc:  Oral Oral Oral  SpO2:  99% 97% 98%  Weight:    75.6 kg (166 lb 11.2 oz)  Height:       Weight change: -1.043 kg (-2 lb 4.8 oz)  Intake/Output Summary (Last 24 hours) at 07/21/16 5852 Last data filed at 07/21/16 0500  Gross per 24 hour  Intake              840 ml  Output                0 ml  Net              840 ml    Assessment/Plan: 55 year old black male with long-standing poorly controlled hypertension now presenting with acute on chronic renal failure  1.Renal- patient's baseline renal function is not completely normal (1.68 one year ago). He now presents with acute on chronic renal failure in the setting of malignant hypertension/ missing blood pressure medications as well as NSAID use. He tells me that his sister is on dialysis which would put him at increased risk for renal failure as well. needs to stop taking NSAIDs and needs to make an effort at blood pressure control as that is the only thing that could possibly put the prospect of dialysis further of the future for him. He needs to alter his lifestyle choices of tobacco alcohol and heroin as well.  U/A not consistent with GN. Ultrasound no obsrtruction. Maybe creatinine trying to plateau 2. Hypertension/volume  - presenting with very high blood pressure. It was corrected perhaps too quickly so could've given him some ATN and that is what is responsible for his most recent climbing creatinine.  simplify his blood pressure regimen. Now on labetalol 100 BID, bidil BID and aldactone 12.5  BID- increasing aldactone to 25 BID today and follow 3. Hypokalemia- on repletion. Aldactone has helped- stopped kdur 4. Anemia  - not a significant issue at this time but is dropping- follow 5. Dispo- if BP is reasonable tomorrow (140-160/90) and creatinine no worse - or hopefully better - I would be comfortable to have him discharged and follow with me as OP - made appt October 30 at 12:45   [place]: Basic Metabolic Panel:  Recent Labs Lab 07/19/16 0240 07/20/16 0440 07/21/16 0352  NA 133* 132* 134*  K 3.3* 4.3 4.1  CL 96* 94* 97*  CO2 26 21* 24  GLUCOSE 124* 111* 87  BUN 55* 57* 56*  CREATININE 5.72* 5.85* 5.63*  CALCIUM 8.2* 8.4* 8.4*  PHOS  --  5.3* 5.1*   Liver Function Tests:  Recent Labs Lab 07/20/16 0440 07/21/16 0352  ALBUMIN 2.9* 2.5*   No results for input(s): LIPASE, AMYLASE in the last 168 hours. No results for input(s): AMMONIA in the last 168 hours. CBC:  Recent Labs Lab 07/17/16 1133 07/18/16 0734 07/19/16 0240  WBC 7.8 9.3 6.8  HGB 12.4* 11.0* 10.1*  HCT 37.9* 32.9* 30.7*  MCV 88.6 87.0  87.2  PLT 231 195 205   Cardiac Enzymes:  Recent Labs Lab 07/17/16 1841 07/18/16 0129 07/18/16 0734  TROPONINI 0.17* 0.15* 0.14*   CBG: No results for input(s): GLUCAP in the last 168 hours.  Iron Studies: No results for input(s): IRON, TIBC, TRANSFERRIN, FERRITIN in the last 72 hours. Studies/Results: Dg Chest Port 1 View  Result Date: 07/19/2016 CLINICAL DATA:  Shortness of breath EXAM: PORTABLE CHEST 1 VIEW COMPARISON:  Chest x-ray of 07/17/2016 FINDINGS: There is little change in cardiomegaly and probable pulmonary vascular congestion. No focal infiltrate is seen and no definite pleural effusion is noted. No bony abnormality is seen. IMPRESSION: No significant change in cardiomegaly and probable pulmonary vascular congestion. Electronically Signed   By: Ivar Drape M.D.   On: 07/19/2016 16:33   Medications: Infusions:     Scheduled Medications: . folic acid  1 mg Oral Daily  . heparin  5,000 Units Subcutaneous Q8H  . isosorbide-hydrALAZINE  1 tablet Oral BID  . labetalol  100 mg Oral BID  . multivitamin with minerals  1 tablet Oral Daily  . nicotine  21 mg Transdermal Daily  . polyethylene glycol  17 g Oral Daily  . senna-docusate  1 tablet Oral BID  . sodium chloride flush  3 mL Intravenous Q12H  . spironolactone  25 mg Oral BID  . tamsulosin  0.4 mg Oral Daily  . thiamine  100 mg Oral Daily    have reviewed scheduled and prn medications.  Physical Exam: General: NAD Heart: RRR Lungs: clear Abdomen: soft, non tender Extremities: no signif edema    07/21/2016,9:18 AM  LOS: 4 days

## 2016-07-22 ENCOUNTER — Inpatient Hospital Stay (HOSPITAL_COMMUNITY): Payer: Medicaid Other

## 2016-07-22 LAB — RENAL FUNCTION PANEL
ANION GAP: 10 (ref 5–15)
Albumin: 2.9 g/dL — ABNORMAL LOW (ref 3.5–5.0)
BUN: 59 mg/dL — ABNORMAL HIGH (ref 6–20)
CHLORIDE: 101 mmol/L (ref 101–111)
CO2: 23 mmol/L (ref 22–32)
Calcium: 8.6 mg/dL — ABNORMAL LOW (ref 8.9–10.3)
Creatinine, Ser: 5.61 mg/dL — ABNORMAL HIGH (ref 0.61–1.24)
GFR calc non Af Amer: 10 mL/min — ABNORMAL LOW (ref >60)
GFR, EST AFRICAN AMERICAN: 12 mL/min — AB (ref >60)
Glucose, Bld: 93 mg/dL (ref 65–99)
POTASSIUM: 4.5 mmol/L (ref 3.5–5.1)
Phosphorus: 5.9 mg/dL — ABNORMAL HIGH (ref 2.5–4.6)
Sodium: 134 mmol/L — ABNORMAL LOW (ref 135–145)

## 2016-07-22 MED ORDER — HYDRALAZINE HCL 20 MG/ML IJ SOLN
10.0000 mg | Freq: Once | INTRAMUSCULAR | Status: AC
  Administered 2016-07-22: 10 mg via INTRAVENOUS
  Filled 2016-07-22: qty 1

## 2016-07-22 MED ORDER — THIAMINE HCL 100 MG PO TABS: 100.0000 mg | ORAL_TABLET | Freq: Every day | ORAL | 3 refills | Status: DC

## 2016-07-22 MED ORDER — FOLIC ACID 1 MG PO TABS: 1.0000 mg | ORAL_TABLET | Freq: Every day | ORAL | 3 refills | Status: DC

## 2016-07-22 MED ORDER — AMLODIPINE BESYLATE 5 MG PO TABS: 5.0000 mg | ORAL_TABLET | Freq: Every day | ORAL | 3 refills | Status: DC

## 2016-07-22 MED ORDER — HYDROCODONE-ACETAMINOPHEN 5-325 MG PO TABS: 1.0000 | ORAL_TABLET | ORAL | 0 refills | Status: DC | PRN

## 2016-07-22 MED ORDER — LABETALOL HCL 200 MG PO TABS
200.0000 mg | ORAL_TABLET | Freq: Two times a day (BID) | ORAL | Status: DC
  Administered 2016-07-22: 200 mg via ORAL
  Filled 2016-07-22: qty 1

## 2016-07-22 MED ORDER — SPIRONOLACTONE 25 MG PO TABS: 25.0000 mg | ORAL_TABLET | Freq: Two times a day (BID) | ORAL | 3 refills | Status: DC

## 2016-07-22 MED ORDER — AMLODIPINE BESYLATE 10 MG PO TABS: 10.0000 mg | ORAL_TABLET | Freq: Every day | ORAL | 30 refills | Status: DC

## 2016-07-22 MED ORDER — HYDROCODONE-ACETAMINOPHEN 5-325 MG PO TABS
1.0000 | ORAL_TABLET | ORAL | Status: DC | PRN
  Administered 2016-07-22 (×2): 1 via ORAL
  Filled 2016-07-22 (×2): qty 1

## 2016-07-22 MED ORDER — HYDRALAZINE HCL 20 MG/ML IJ SOLN
10.0000 mg | Freq: Four times a day (QID) | INTRAMUSCULAR | Status: DC | PRN
  Administered 2016-07-22: 10 mg via INTRAVENOUS
  Filled 2016-07-22: qty 1

## 2016-07-22 MED ORDER — AMLODIPINE BESYLATE 5 MG PO TABS
5.0000 mg | ORAL_TABLET | Freq: Every day | ORAL | Status: DC
  Administered 2016-07-22: 5 mg via ORAL
  Filled 2016-07-22: qty 1

## 2016-07-22 MED ORDER — ISOSORB DINITRATE-HYDRALAZINE 20-37.5 MG PO TABS: 1.0000 | ORAL_TABLET | Freq: Two times a day (BID) | ORAL | 0 refills | Status: DC

## 2016-07-22 MED ORDER — ADULT MULTIVITAMIN W/MINERALS CH: 1.0000 | ORAL_TABLET | Freq: Every day | ORAL | 3 refills | Status: DC

## 2016-07-22 MED ORDER — LABETALOL HCL 200 MG PO TABS: 200.0000 mg | ORAL_TABLET | Freq: Two times a day (BID) | ORAL | 3 refills | Status: DC

## 2016-07-22 NOTE — Discharge Instructions (Signed)
Follow with [facility] in 7 days   Get CBC, CMP,  checked  by Primary MD next visit.    Activity: As tolerated with Full fall precautions use walker/cane & assistance as needed   Disposition Home    Diet: Heart Healthy  , with feeding assistance and aspiration precautions.  For Heart failure patients - Check your Weight same time everyday, if you gain over 2 pounds, or you develop in leg swelling, experience more shortness of breath or chest pain, call your Primary MD immediately. Follow Cardiac Low Salt Diet and 1.5 lit/day fluid restriction.   On your next visit with your primary care physician please Get Medicines reviewed and adjusted.   Please request your Prim.MD to go over all Hospital Tests and Procedure/Radiological results at the follow up, please get all Hospital records sent to your Prim MD by signing hospital release before you go home.   If you experience worsening of your admission symptoms, develop shortness of breath, life threatening emergency, suicidal or homicidal thoughts you must seek medical attention immediately by calling 911 or calling your MD immediately  if symptoms less severe.  You Must read complete instructions/literature along with all the possible adverse reactions/side effects for all the Medicines you take and that have been prescribed to you. Take any new Medicines after you have completely understood and accpet all the possible adverse reactions/side effects.   Do not drive, operating heavy machinery, perform activities at heights, swimming or participation in water activities or provide baby sitting services if your were admitted for syncope or siezures until you have seen by Primary MD or a Neurologist and advised to do so again.  Do not drive when taking Pain medications.    Do not take more than prescribed Pain, Sleep and Anxiety Medications  Special Instructions: If you have smoked or chewed Tobacco  in the last 2 yrs please stop smoking,  stop any regular Alcohol  and or any Recreational drug use.  Wear Seat belts while driving.   Please note  You were cared for by a hospitalist during your hospital stay. If you have any questions about your discharge medications or the care you received while you were in the hospital after you are discharged, you can call the unit and asked to speak with the hospitalist on call if the hospitalist that took care of you is not available. Once you are discharged, your primary care physician will handle any further medical issues. Please note that NO REFILLS for any discharge medications will be authorized once you are discharged, as it is imperative that you return to your primary care physician (or establish a relationship with a primary care physician if you do not have one) for your aftercare needs so that they can reassess your need for medications and monitor your lab values.

## 2016-07-22 NOTE — Discharge Summary (Signed)
Auther Lyerly, is a 55 y.o. male  DOB 28-May-1961  MRN [id number].  Admission date:  07/17/2016  Admitting Physician  Geradine Girt, DO  Discharge Date:  07/22/2016   Primary MD  No PCP Per Patient  Recommendations for primary care physician for things to follow:  - Please check BMP during next visit - Adjust antihypertensive medication as needed   Admission Diagnosis  Chest pain [R07.9] Chest pain, unspecified type [R07.9] Hypertension, unspecified type [I10]   Discharge Diagnosis  Chest pain [R07.9] Chest pain, unspecified type [R07.9] Hypertension, unspecified type [I10]    Active Problems:   Acute diastolic heart failure (HCC)   AKI (acute kidney injury) ([place])   Heroin abuse   HTN (hypertension)   Elevated troponin   Hypertensive heart disease   Chest pain      Past Medical History:  Diagnosis Date  . Acute diastolic heart failure ([place]) 07/17/2016  . BPH (benign prostatic hyperplasia)   . Colon cancer ([place]) 2014  . Elevated troponin   . Hypertension     Past Surgical History:  Procedure Laterality Date  . ABDOMINAL SURGERY    . COLON SURGERY  2014       History of present illness and  Hospital Course:     Kindly see H&P for history of present illness and admission details, please review complete Labs, Consult reports and Test reports for all details in brief  HPI  from the history and physical done on the day of admission 07/2016 Timothy [name] is a 55 y.o. male who is a terribly poor historian, with medical history significant of BPH, "valve" issue, colon cancer?, and heroin abuse (snorting).  He presents to the ER with shortness of breath, worsening chest pain and coughing.  Patient states he last used heroin a few days ago and uses at least monthly.  Denies cocaine.  Has not been taking all his medications (lisinopril).  Moved here a few years ago from [facility]  ED  Course: In the ER, x ray showed Kerly B lines, BNP was markedly elevated, CR was 4-- from baseline of 1.6.  His BP was found to be elevated and he admits to a headache.     Hospital Course   AKI on CKD IV - due to poorly controlled HTN.  Creatinine has increased from 1.68 earlier this year To 5.6 this admission ,  but pt has not seen a physician in that time and presented with malignant HTN.  This is likely progressive CKD as he is asymptomatic and Scr has remained stable since admission.   discussed with patient at length importance to avoidNSAIDs and blood pressure control.  -  To follow up with Dr. Moshe Cipro on 08/14/16 at 12:45.  Acute diastolic CHF -  secondary to hypertensive heart disease and uncontrolled HTN. Clinically improved. Echo with normal LV function, Appears euvolemic currently. - continue with spironolactone  Elevated troponin - secondary to demand ischemia. No further ischemic work up planned.  Polysubstance abuse - Patient counseled  Hypertension - Blood pressure is poorly controlled at the last 24 hours, on  BiDil, labetalol , will start on amlodipine 10 mg total daily.  Discharge Condition:  stable   Follow UP  Follow-up Information    GOLDSBOROUGH,KELLIE A, MD Follow up on 08/14/2016.   Specialty:  Nephrology Why:  appt will be at 1:15-  be there at 12:45 Contact information: [place] [address] [phone]        [facility]. Schedule an appointment as soon as possible for a visit in 1 week(s).   Contact information: [address] [place] [address] [phone]            Discharge Instructions  and  Discharge Medications     Discharge Instructions    Discharge instructions    Complete by:  As directed    Follow with [facility] in 7 days   Get CBC, CMP,  checked  by Primary MD next visit.    Activity: As tolerated with Full fall precautions use walker/cane &  assistance as needed   Disposition Home    Diet: Heart Healthy  , with feeding assistance and aspiration precautions.  For Heart failure patients - Check your Weight same time everyday, if you gain over 2 pounds, or you develop in leg swelling, experience more shortness of breath or chest pain, call your Primary MD immediately. Follow Cardiac Low Salt Diet and 1.5 lit/day fluid restriction.   On your next visit with your primary care physician please Get Medicines reviewed and adjusted.   Please request your Prim.MD to go over all Hospital Tests and Procedure/Radiological results at the follow up, please get all Hospital records sent to your Prim MD by signing hospital release before you go home.   If you experience worsening of your admission symptoms, develop shortness of breath, life threatening emergency, suicidal or homicidal thoughts you must seek medical attention immediately by calling 911 or calling your MD immediately  if symptoms less severe.  You Must read complete instructions/literature along with all the possible adverse reactions/side effects for all the Medicines you take and that have been prescribed to you. Take any new Medicines after you have completely understood and accpet all the possible adverse reactions/side effects.   Do not drive, operating heavy machinery, perform activities at heights, swimming or participation in water activities or provide baby sitting services if your were admitted for syncope or siezures until you have seen by Primary MD or a Neurologist and advised to do so again.  Do not drive when taking Pain medications.    Do not take more than prescribed Pain, Sleep and Anxiety Medications  Special Instructions: If you have smoked or chewed Tobacco  in the last 2 yrs please stop smoking, stop any regular Alcohol  and or any Recreational drug use.  Wear Seat belts while driving.   Please note  You were cared for by a hospitalist during your  hospital stay. If you have any questions about your discharge medications or the care you received while you were in the hospital after you are discharged, you can call the unit and asked to speak with the hospitalist on call if the hospitalist that took care of you is not available. Once you are discharged, your primary care physician will handle any further medical issues. Please note that NO REFILLS for any discharge medications will be authorized once you are discharged, as it is imperative that you return  to your primary care physician (or establish a relationship with a primary care physician if you do not have one) for your aftercare needs so that they can reassess your need for medications and monitor your lab values.   Increase activity slowly    Complete by:  As directed        Medication List    STOP taking these medications   diltiazem 360 MG 24 hr capsule Commonly known as:  CARDIZEM CD   ibuprofen 200 MG tablet Commonly known as:  ADVIL,MOTRIN   lisinopril 20 MG tablet Commonly known as:  PRINIVIL,ZESTRIL     TAKE these medications   acetaminophen 325 MG tablet Commonly known as:  TYLENOL Take 650 mg by mouth every 6 (six) hours as needed for mild pain.   amLODipine 10 MG tablet Commonly known as:  NORVASC Take 1 tablet (10 mg total) by mouth daily.   cyclobenzaprine 10 MG tablet Commonly known as:  FLEXERIL Take 1 tablet (10 mg total) by mouth 3 (three) times daily as needed for muscle spasms.   folic acid 1 MG tablet Commonly known as:  FOLVITE Take 1 tablet (1 mg total) by mouth daily. Start taking on:  07/23/2016   HYDROcodone-acetaminophen 5-325 MG tablet Commonly known as:  NORCO/VICODIN Take 1 tablet by mouth every 4 (four) hours as needed for severe pain.   isosorbide-hydrALAZINE 20-37.5 MG tablet Commonly known as:  BIDIL Take 1 tablet by mouth 2 (two) times daily.   labetalol 200 MG tablet Commonly known as:  NORMODYNE Take 1 tablet (200 mg total)  by mouth 2 (two) times daily.   multivitamin with minerals Tabs tablet Take 1 tablet by mouth daily. Start taking on:  07/23/2016   pravastatin 40 MG tablet Commonly known as:  PRAVACHOL Take 1 tablet (40 mg total) by mouth daily.   spironolactone 25 MG tablet Commonly known as:  ALDACTONE Take 1 tablet (25 mg total) by mouth 2 (two) times daily.   tamsulosin 0.4 MG Caps capsule Commonly known as:  FLOMAX Take 1 capsule (0.4 mg total) by mouth daily.   thiamine 100 MG tablet Take 1 tablet (100 mg total) by mouth daily. Start taking on:  07/23/2016         Diet and Activity recommendation: See Discharge Instructions above   Consults obtained -  Renal  cardiology   Major procedures and Radiology Reports - PLEASE review detailed and final reports for all details, in brief -      Ct Abdomen Pelvis Wo Contrast  Result Date: 07/17/2016 CLINICAL DATA:  Left chest pain radiating to back, productive cough. Diarrhea/ vomiting. EXAM: CT CHEST, ABDOMEN AND PELVIS WITHOUT CONTRAST TECHNIQUE: Multidetector CT imaging of the chest, abdomen and pelvis was performed following the standard protocol without IV contrast. COMPARISON:  CT abdomen/ pelvis dated 06/25/2015. FINDINGS: CT CHEST FINDINGS Cardiovascular: Mild cardiomegaly.  Small pericardial effusion. Mild atherosclerotic calcifications of the aortic arch. 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 Mediastinum/Nodes: No suspicious mediastinal lymphadenopathy. Visualized thyroid is unremarkable. Lungs/Pleura: Small left and trace right pleural effusions. Mild ground-glass opacity/increased interstitial markings in the bilateral lower lobes, favored to reflect atelectasis. No focal consolidation. No suspicious pulmonary nodules. No pneumothorax. Musculoskeletal: Visualized osseous structures are within normal limits. CT ABDOMEN PELVIS FINDINGS Hepatobiliary: Unenhanced liver is unremarkable. Suspected 1.6 cm noncalcified  gallstone and layering gallbladder sludge (series 201/ image 89). No associated inflammatory changes. Pancreas: Within normal limits. Spleen: Within normal limits. Adrenals/Urinary Tract: Adrenal glands  are within normal limits. Kidneys are within normal limits.  No hydronephrosis. Bladder is within normal limits. Stomach/Bowel: Stomach is within normal limits. Status post right hemicolectomy with appendectomy, better evaluated on prior CT. No evidence of bowel obstruction. Vascular/Lymphatic: No evidence of abdominal aortic aneurysm. Atherosclerotic calcifications of the abdominal aorta and branch vessels. No suspicious abdominopelvic lymphadenopathy. Reproductive: Prostate is unremarkable. Other: No abdominopelvic ascites. Musculoskeletal: Mild degenerative changes at L3-4. 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 No evidence of thoracoabdominal aortic aneurysm. Please note that dissection cannot be excluded on unenhanced CT. Small pericardial effusion, new from 2016 CT abdomen/pelvis. Consider echocardiogram for further evaluation as clinically warranted. Small bilateral pleural effusions, left greater than right. Mild bilateral lower lobe atelectasis. Status post right hemicolectomy with appendectomy. No evidence of bowel obstruction. Electronically Signed   By: Julian Hy M.D.   On: 07/17/2016 14:53   Dg Chest 2 View  Result Date: 07/17/2016 CLINICAL DATA:  Chest pain.  Shortness of breath EXAM: CHEST  2 VIEW COMPARISON:  No prior. FINDINGS: Mediastinum and hilar structures normal. Cardiomegaly with normal pulmonary vascularity mild pulmonary interstitial prominence consistent congestive heart failure. Mild bibasilar atelectasis and/or scarring. No pleural effusion or pneumothorax. IMPRESSION: Cardiomegaly with pulmonary vascular prominence and bilateral interstitial prominence with Kerley B-lines. Small bilateral pleural effusions. Findings consistent mild congestive  heart failure. Electronically Signed   By: Marcello Moores  Register   On: 07/17/2016 10:15   Dg Lumbar Spine 2-3 Views  Result Date: 07/22/2016 CLINICAL DATA:  Chronic lumbago EXAM: LUMBAR SPINE - 2-3 VIEW COMPARISON:  None. FINDINGS: Frontal, lateral, and spot lumbosacral lateral images were obtained. There are 5 non-rib-bearing lumbar type vertebral bodies. There is no fracture or spondylolisthesis. The disc spaces appear unremarkable. There are small anterior osteophytes at L3 and L4. There is postoperative change in the right abdomen. IMPRESSION: No fracture or spondylolisthesis. No appreciable disc space narrowing. Electronically Signed   By: Lowella Grip III M.D.   On: 07/22/2016 13:39   Ct Chest Wo Contrast  Result Date: 07/17/2016 CLINICAL DATA:  Left chest pain radiating to back, productive cough. Diarrhea/ vomiting. EXAM: CT CHEST, ABDOMEN AND PELVIS WITHOUT CONTRAST TECHNIQUE: Multidetector CT imaging of the chest, abdomen and pelvis was performed following the standard protocol without IV contrast. COMPARISON:  CT abdomen/ pelvis dated 06/25/2015. FINDINGS: CT CHEST FINDINGS Cardiovascular: Mild cardiomegaly.  Small pericardial effusion. Mild atherosclerotic calcifications of the aortic arch. 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 Mediastinum/Nodes: No suspicious mediastinal lymphadenopathy. Visualized thyroid is unremarkable. Lungs/Pleura: Small left and trace right pleural effusions. Mild ground-glass opacity/increased interstitial markings in the bilateral lower lobes, favored to reflect atelectasis. No focal consolidation. No suspicious pulmonary nodules. No pneumothorax. Musculoskeletal: Visualized osseous structures are within normal limits. CT ABDOMEN PELVIS FINDINGS Hepatobiliary: Unenhanced liver is unremarkable. Suspected 1.6 cm noncalcified gallstone and layering gallbladder sludge (series 201/ image 89). No associated inflammatory changes. Pancreas: Within normal  limits. Spleen: Within normal limits. Adrenals/Urinary Tract: Adrenal glands are within normal limits. Kidneys are within normal limits.  No hydronephrosis. Bladder is within normal limits. Stomach/Bowel: Stomach is within normal limits. Status post right hemicolectomy with appendectomy, better evaluated on prior CT. No evidence of bowel obstruction. Vascular/Lymphatic: No evidence of abdominal aortic aneurysm. Atherosclerotic calcifications of the abdominal aorta and branch vessels. No suspicious abdominopelvic lymphadenopathy. Reproductive: Prostate is unremarkable. Other: No abdominopelvic ascites. Musculoskeletal: Mild degenerative changes at L3-4. 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 No evidence of  thoracoabdominal aortic aneurysm. Please note that dissection cannot be excluded on unenhanced CT. Small pericardial effusion, new from 2016 CT abdomen/pelvis. Consider echocardiogram for further evaluation as clinically warranted. Small bilateral pleural effusions, left greater than right. Mild bilateral lower lobe atelectasis. Status post right hemicolectomy with appendectomy. No evidence of bowel obstruction. Electronically Signed   By: Julian Hy M.D.   On: 07/17/2016 14:53   US Renal  Result Date: 07/18/2016 CLINICAL DATA:  Acute renal injury EXAM: RENAL / URINARY TRACT ULTRASOUND COMPLETE COMPARISON:  07/17/2016 CT FINDINGS: Right Kidney: Length: 10.2 cm. Echogenicity is isoechoic to that of adjacent liver. There is loss of cortical-medullary distinction. No nephrolithiasis. No mass or hydronephrosis visualized. Left Kidney: Length: 10.4 cm. Echogenicity is also increased similar to the contralateral kidney with loss of cortical-medullary distinction. There is a cyst laterally which appears complex with internal debris measuring approximately 8 mm. No mass or hydronephrosis visualized. Bladder: Appears normal for degree of bladder distention. IMPRESSION: Normal size  kidneys bilaterally however there is loss of cortical-medullary distinction within both kidneys consistent with medical renal disease. Complex lateral left renal cyst measuring 8 mm. Electronically Signed   By: Ashley Royalty M.D.   On: 07/18/2016 23:50   Dg Chest Port 1 View  Result Date: 07/19/2016 CLINICAL DATA:  Shortness of breath EXAM: PORTABLE CHEST 1 VIEW COMPARISON:  Chest x-ray of 07/17/2016 FINDINGS: There is little change in cardiomegaly and probable pulmonary vascular congestion. No focal infiltrate is seen and no definite pleural effusion is noted. No bony abnormality is seen. IMPRESSION: No significant change in cardiomegaly and probable pulmonary vascular congestion. Electronically Signed   By: Ivar Drape M.D.   On: 07/19/2016 16:33    Micro Results     Recent Results (from the past 240 hour(s))  MRSA PCR Screening     Status: None   Collection Time: 07/17/16  6:47 PM  Result Value Ref Range Status   MRSA by PCR NEGATIVE NEGATIVE Final    Comment:        The GeneXpert MRSA Assay (FDA approved for NASAL specimens only), is one component of a comprehensive MRSA colonization surveillance program. It is not intended to diagnose MRSA infection nor to guide or monitor treatment for MRSA infections.        Today   Subjective:   Timothy [name] today has no chest abdominal pain,no new weakness tingling or numbness, feels much better today, Complaining of headache during the day, but improved after blood pressure is controlled  Objective:   Blood pressure (!) 159/103, pulse 74, temperature 98.6 F (37 C), temperature source Oral, resp. rate 18, height 6\' 1"  (1.854 m), weight 73.8 kg (162 lb 9.6 oz), SpO2 94 %.   Intake/Output Summary (Last 24 hours) at 07/22/16 1709 Last data filed at 07/22/16 1500  Gross per 24 hour  Intake              480 ml  Output                0 ml  Net              480 ml    Exam Awake Alert, Oriented x 3, No new F.N deficits, Normal  affect Timothy [name],PERRAL Supple Neck,No JVD, No cervical lymphadenopathy appriciated.  Symmetrical Chest wall movement, Good air movement bilaterally, CTAB RRR,No Gallops,Rubs or new Murmurs, No Parasternal Heave +ve B.Sounds, Abd Soft, Non tender, No organomegaly appriciated, No rebound -guarding or rigidity. No Cyanosis, Clubbing or  edema, No new Rash or bruise  Data Review   CBC w Diff:  Lab Results  Component Value Date   WBC 6.8 07/19/2016   HGB 10.1 (L) 07/19/2016   HCT 30.7 (L) 07/19/2016   PLT 205 07/19/2016   LYMPHOPCT 30 07/29/2015   MONOPCT 13 (H) 07/29/2015   EOSPCT 1 07/29/2015   BASOPCT 0 07/29/2015    CMP:  Lab Results  Component Value Date   NA 134 (L) 07/22/2016   K 4.5 07/22/2016   CL 101 07/22/2016   CO2 23 07/22/2016   BUN 59 (H) 07/22/2016   CREATININE 5.61 (H) 07/22/2016   CREATININE 1.59 (H) 07/29/2015   PROT 7.6 07/29/2015   ALBUMIN 2.9 (L) 07/22/2016   BILITOT 0.6 07/29/2015   ALKPHOS 85 07/29/2015   AST 39 (H) 07/29/2015   ALT 39 07/29/2015  .   Total Time in preparing paper work, data evaluation and todays exam - 35 minutes  Azara Gemme M.D on 07/22/2016 at 5:09 PM  [facility]   Office  [phone]

## 2016-07-22 NOTE — Progress Notes (Signed)
Patient ID: Harlo Fabela, male   DOB: 1961-04-07, 55 y.o.   MRN: [id number] S:complaining of HA across the top of his forehead O:BP (!) 190/120   Pulse 71   Temp 97.9 F (36.6 C) (Oral)   Resp 18   Ht 6\' 1"  (1.854 m)   Wt 73.8 kg (162 lb 9.6 oz)   SpO2 94%   BMI 21.45 kg/m   Intake/Output Summary (Last 24 hours) at 07/22/16 0906 Last data filed at 07/21/16 2200  Gross per 24 hour  Intake              240 ml  Output                0 ml  Net              240 ml   Intake/Output: I/O last 3 completed shifts: In: 840 [P.O.:840] Out: -   Intake/Output this shift:  No intake/output data recorded. Weight change: -1.86 kg (-4 lb 1.6 oz) Gen:WD AAM in NAD CVS:no rub Resp:cta INO:[facility] Ext:no edema   Recent Labs Lab 07/17/16 1133 07/18/16 0734 07/19/16 0240 07/20/16 0440 07/21/16 0352 07/22/16 0334  NA 139 134* 133* 132* 134* 134*  K 3.4* 2.7* 3.3* 4.3 4.1 4.5  CL 102 100* 96* 94* 97* 101  CO2 26 25 26  21* 24 23  GLUCOSE 108* 156* 124* 111* 87 93  BUN 49* 51* 55* 57* 56* 59*  CREATININE 4.88* 4.94* 5.72* 5.85* 5.63* 5.61*  ALBUMIN  --   --   --  2.9* 2.5* 2.9*  CALCIUM 8.6* 8.2* 8.2* 8.4* 8.4* 8.6*  PHOS  --   --   --  5.3* 5.1* 5.9*   Liver Function Tests:  Recent Labs Lab 07/20/16 0440 07/21/16 0352 07/22/16 0334  ALBUMIN 2.9* 2.5* 2.9*   No results for input(s): LIPASE, AMYLASE in the last 168 hours. No results for input(s): AMMONIA in the last 168 hours. CBC:  Recent Labs Lab 07/17/16 1133 07/18/16 0734 07/19/16 0240  WBC 7.8 9.3 6.8  HGB 12.4* 11.0* 10.1*  HCT 37.9* 32.9* 30.7*  MCV 88.6 87.0 87.2  PLT 231 195 205   Cardiac Enzymes:  Recent Labs Lab 07/17/16 1841 07/18/16 0129 07/18/16 0734  TROPONINI 0.17* 0.15* 0.14*   CBG: No results for input(s): GLUCAP in the last 168 hours.  Iron Studies: No results for input(s): IRON, TIBC, TRANSFERRIN, FERRITIN in the last 72 hours. Studies/Results: No results found. . folic acid  1 mg  Oral Daily  . heparin  5,000 Units Subcutaneous Q8H  . isosorbide-hydrALAZINE  1 tablet Oral BID  . labetalol  200 mg Oral BID  . multivitamin with minerals  1 tablet Oral Daily  . nicotine  21 mg Transdermal Daily  . polyethylene glycol  17 g Oral Daily  . senna-docusate  1 tablet Oral BID  . sodium chloride flush  3 mL Intravenous Q12H  . spironolactone  25 mg Oral BID  . tamsulosin  0.4 mg Oral Daily  . thiamine  100 mg Oral Daily    BMET    Component Value Date/Time   NA 134 (L) 07/22/2016 0334   K 4.5 07/22/2016 0334   CL 101 07/22/2016 0334   CO2 23 07/22/2016 0334   GLUCOSE 93 07/22/2016 0334   BUN 59 (H) 07/22/2016 0334   CREATININE 5.61 (H) 07/22/2016 0334   CREATININE 1.59 (H) 07/29/2015 0943   CALCIUM 8.6 (L) 07/22/2016 0334   GFRNONAA 10 (L) 07/22/2016  0334   GFRNONAA 48 (L) 07/29/2015 0943   GFRAA 12 (L) 07/22/2016 0334   GFRAA 56 (L) 07/29/2015 0943   CBC    Component Value Date/Time   WBC 6.8 07/19/2016 0240   RBC 3.52 (L) 07/19/2016 0240   HGB 10.1 (L) 07/19/2016 0240   HCT 30.7 (L) 07/19/2016 0240   PLT 205 07/19/2016 0240   MCV 87.2 07/19/2016 0240   MCH 28.7 07/19/2016 0240   MCHC 32.9 07/19/2016 0240   RDW 14.7 07/19/2016 0240   LYMPHSABS 1.9 07/29/2015 0943   MONOABS 0.8 07/29/2015 0943   EOSABS 0.1 07/29/2015 0943   BASOSABS 0.0 07/29/2015 0943     Assessment/Plan:  1. AKI/CKD vs progressive CKD due to poorly controlled HTN.  Scr has risen from 1.68 to 5.6 over the last year but pt has not seen a physician in that time and presented with malignant HTN.  This is likely progressive CKD as he is asymptomatic and Scr has remained stable since admission.  Continue to stress avoidance of NSAIDs and blood pressure control.  To follow up with Dr. Moshe Cipro on 08/14/16 at 12:45. 2. HTN/volume- on multiple agents, most are short acting.  BP's were much better after admission and have been rising today since stopping his diltiazem. Would recommend  long-acting agent such as amlodipine 10mg  daily instead of restarting cardizem due to the negative chronotropic effects of labetalol which would be additive to cardizem.   3. Polysubstance abuse- +etoh, tobacco, and heroin 4. Headache- unclear if this is due to HTN or Bidil.  I spoke with Dr. [facility] and plan to start amlodipine 10mg  and follow BP.  If HA persists despite drop in BP, may need to stop bidil and just use hydralazine due to possible ntg induced HA 5. Disposition- has outpatient follow up with Dr. Moshe Cipro arranged.  [place]

## 2016-07-22 NOTE — Progress Notes (Signed)
Report received via Conservation officer, nature in patient's room using SBAR format, reviewed orders, VS, meds and patient's general condition, assumed care of patient.

## 2016-09-09 ENCOUNTER — Inpatient Hospital Stay (HOSPITAL_COMMUNITY): Payer: Medicaid Other

## 2016-09-09 ENCOUNTER — Other Ambulatory Visit: Payer: Self-pay

## 2016-09-09 ENCOUNTER — Encounter (HOSPITAL_COMMUNITY): Payer: Self-pay | Admitting: Emergency Medicine

## 2016-09-09 ENCOUNTER — Inpatient Hospital Stay (HOSPITAL_COMMUNITY)
Admission: EM | Admit: 2016-09-09 | Discharge: 2016-09-21 | DRG: 673 | Disposition: A | Discharge status: Home Health | Payer: Medicaid Other | Attending: Internal Medicine | Admitting: Internal Medicine

## 2016-09-09 ENCOUNTER — Emergency Department (HOSPITAL_COMMUNITY): Payer: Medicaid Other

## 2016-09-09 DIAGNOSIS — I5031 Acute diastolic (congestive) heart failure: Secondary | ICD-10-CM | POA: Diagnosis not present

## 2016-09-09 DIAGNOSIS — Z91199 Patient's noncompliance with other medical treatment and regimen due to unspecified reason: Secondary | ICD-10-CM

## 2016-09-09 DIAGNOSIS — D72829 Elevated white blood cell count, unspecified: Secondary | ICD-10-CM

## 2016-09-09 DIAGNOSIS — J9602 Acute respiratory failure with hypercapnia: Secondary | ICD-10-CM | POA: Diagnosis present

## 2016-09-09 DIAGNOSIS — J810 Acute pulmonary edema: Secondary | ICD-10-CM

## 2016-09-09 DIAGNOSIS — E872 Acidosis: Secondary | ICD-10-CM | POA: Diagnosis present

## 2016-09-09 DIAGNOSIS — I132 Hypertensive heart and chronic kidney disease with heart failure and with stage 5 chronic kidney disease, or end stage renal disease: Secondary | ICD-10-CM | POA: Diagnosis present

## 2016-09-09 DIAGNOSIS — B1920 Unspecified viral hepatitis C without hepatic coma: Secondary | ICD-10-CM | POA: Diagnosis present

## 2016-09-09 DIAGNOSIS — D631 Anemia in chronic kidney disease: Secondary | ICD-10-CM | POA: Diagnosis present

## 2016-09-09 DIAGNOSIS — I5033 Acute on chronic diastolic (congestive) heart failure: Secondary | ICD-10-CM | POA: Diagnosis present

## 2016-09-09 DIAGNOSIS — E440 Moderate protein-calorie malnutrition: Secondary | ICD-10-CM | POA: Diagnosis present

## 2016-09-09 DIAGNOSIS — E162 Hypoglycemia, unspecified: Secondary | ICD-10-CM | POA: Diagnosis not present

## 2016-09-09 DIAGNOSIS — F329 Major depressive disorder, single episode, unspecified: Secondary | ICD-10-CM | POA: Diagnosis present

## 2016-09-09 DIAGNOSIS — Z85038 Personal history of other malignant neoplasm of large intestine: Secondary | ICD-10-CM | POA: Diagnosis not present

## 2016-09-09 DIAGNOSIS — D62 Acute posthemorrhagic anemia: Secondary | ICD-10-CM | POA: Diagnosis not present

## 2016-09-09 DIAGNOSIS — F1110 Opioid abuse, uncomplicated: Secondary | ICD-10-CM | POA: Diagnosis present

## 2016-09-09 DIAGNOSIS — R4182 Altered mental status, unspecified: Secondary | ICD-10-CM | POA: Diagnosis present

## 2016-09-09 DIAGNOSIS — E871 Hypo-osmolality and hyponatremia: Secondary | ICD-10-CM | POA: Diagnosis present

## 2016-09-09 DIAGNOSIS — Z992 Dependence on renal dialysis: Secondary | ICD-10-CM

## 2016-09-09 DIAGNOSIS — F419 Anxiety disorder, unspecified: Secondary | ICD-10-CM | POA: Diagnosis present

## 2016-09-09 DIAGNOSIS — Z79899 Other long term (current) drug therapy: Secondary | ICD-10-CM

## 2016-09-09 DIAGNOSIS — I248 Other forms of acute ischemic heart disease: Secondary | ICD-10-CM | POA: Diagnosis present

## 2016-09-09 DIAGNOSIS — J811 Chronic pulmonary edema: Secondary | ICD-10-CM | POA: Diagnosis present

## 2016-09-09 DIAGNOSIS — G9341 Metabolic encephalopathy: Secondary | ICD-10-CM | POA: Diagnosis present

## 2016-09-09 DIAGNOSIS — N186 End stage renal disease: Secondary | ICD-10-CM

## 2016-09-09 DIAGNOSIS — J9600 Acute respiratory failure, unspecified whether with hypoxia or hypercapnia: Secondary | ICD-10-CM | POA: Diagnosis not present

## 2016-09-09 DIAGNOSIS — IMO0002 Reserved for concepts with insufficient information to code with codable children: Secondary | ICD-10-CM | POA: Insufficient documentation

## 2016-09-09 DIAGNOSIS — N179 Acute kidney failure, unspecified: Secondary | ICD-10-CM | POA: Diagnosis present

## 2016-09-09 DIAGNOSIS — IMO0001 Reserved for inherently not codable concepts without codable children: Secondary | ICD-10-CM

## 2016-09-09 DIAGNOSIS — T50904A Poisoning by unspecified drugs, medicaments and biological substances, undetermined, initial encounter: Secondary | ICD-10-CM

## 2016-09-09 DIAGNOSIS — Z09 Encounter for follow-up examination after completed treatment for conditions other than malignant neoplasm: Secondary | ICD-10-CM

## 2016-09-09 DIAGNOSIS — Z9119 Patient's noncompliance with other medical treatment and regimen: Secondary | ICD-10-CM

## 2016-09-09 DIAGNOSIS — I509 Heart failure, unspecified: Secondary | ICD-10-CM | POA: Diagnosis not present

## 2016-09-09 DIAGNOSIS — F1910 Other psychoactive substance abuse, uncomplicated: Secondary | ICD-10-CM | POA: Diagnosis present

## 2016-09-09 DIAGNOSIS — N189 Chronic kidney disease, unspecified: Secondary | ICD-10-CM | POA: Diagnosis present

## 2016-09-09 DIAGNOSIS — J9601 Acute respiratory failure with hypoxia: Secondary | ICD-10-CM

## 2016-09-09 DIAGNOSIS — Z681 Body mass index (BMI) 19 or less, adult: Secondary | ICD-10-CM

## 2016-09-09 DIAGNOSIS — Z95828 Presence of other vascular implants and grafts: Secondary | ICD-10-CM

## 2016-09-09 DIAGNOSIS — I161 Hypertensive emergency: Secondary | ICD-10-CM

## 2016-09-09 DIAGNOSIS — F17210 Nicotine dependence, cigarettes, uncomplicated: Secondary | ICD-10-CM | POA: Diagnosis present

## 2016-09-09 DIAGNOSIS — I5043 Acute on chronic combined systolic (congestive) and diastolic (congestive) heart failure: Secondary | ICD-10-CM | POA: Diagnosis present

## 2016-09-09 DIAGNOSIS — R0602 Shortness of breath: Secondary | ICD-10-CM | POA: Diagnosis not present

## 2016-09-09 DIAGNOSIS — N400 Enlarged prostate without lower urinary tract symptoms: Secondary | ICD-10-CM | POA: Diagnosis present

## 2016-09-09 LAB — TROPONIN I
TROPONIN I: 0.41 ng/mL — AB (ref <0.03)
Troponin I: 0.42 ng/mL (ref <0.03)
Troponin I: 0.44 ng/mL (ref <0.03)
Troponin I: 0.33 ng/mL (ref <0.03)

## 2016-09-09 LAB — BRAIN NATRIURETIC PEPTIDE: B Natriuretic Peptide: 3212.6 pg/mL — ABNORMAL HIGH (ref 0.0–100.0)

## 2016-09-09 LAB — ACETAMINOPHEN LEVEL: Acetaminophen (Tylenol), Serum: <10 ug/mL — ABNORMAL LOW (ref 10–30)

## 2016-09-09 LAB — GLUCOSE, CAPILLARY
GLUCOSE-CAPILLARY: 102 mg/dL — AB (ref 65–99)
GLUCOSE-CAPILLARY: 80 mg/dL (ref 65–99)
GLUCOSE-CAPILLARY: 93 mg/dL (ref 65–99)
Glucose-Capillary: 98 mg/dL (ref 65–99)

## 2016-09-09 LAB — BASIC METABOLIC PANEL
ANION GAP: 14 (ref 5–15)
BUN: 114 mg/dL — ABNORMAL HIGH (ref 6–20)
CALCIUM: 7.5 mg/dL — AB (ref 8.9–10.3)
CO2: 18 mmol/L — AB (ref 22–32)
Chloride: 107 mmol/L (ref 101–111)
Creatinine, Ser: 8.21 mg/dL — ABNORMAL HIGH (ref 0.61–1.24)
GFR, EST AFRICAN AMERICAN: 8 mL/min — AB (ref >60)
GFR, EST NON AFRICAN AMERICAN: 7 mL/min — AB (ref >60)
Glucose, Bld: 84 mg/dL (ref 65–99)
Potassium: 3.9 mmol/L (ref 3.5–5.1)
Sodium: 139 mmol/L (ref 135–145)

## 2016-09-09 LAB — COMPREHENSIVE METABOLIC PANEL
ALBUMIN: 3.1 g/dL — AB (ref 3.5–5.0)
ALT: 34 U/L (ref 17–63)
AST: 45 U/L — AB (ref 15–41)
Alkaline Phosphatase: 63 U/L (ref 38–126)
Anion gap: 16 — ABNORMAL HIGH (ref 5–15)
BUN: 109 mg/dL — AB (ref 6–20)
CHLORIDE: 103 mmol/L (ref 101–111)
CO2: 18 mmol/L — AB (ref 22–32)
CREATININE: 8.35 mg/dL — AB (ref 0.61–1.24)
Calcium: 7.7 mg/dL — ABNORMAL LOW (ref 8.9–10.3)
GFR calc Af Amer: 7 mL/min — ABNORMAL LOW (ref >60)
GFR, EST NON AFRICAN AMERICAN: 6 mL/min — AB (ref >60)
Glucose, Bld: 89 mg/dL (ref 65–99)
POTASSIUM: 3.6 mmol/L (ref 3.5–5.1)
SODIUM: 137 mmol/L (ref 135–145)
Total Bilirubin: 0.3 mg/dL (ref 0.3–1.2)
Total Protein: 6.2 g/dL — ABNORMAL LOW (ref 6.5–8.1)

## 2016-09-09 LAB — PHOSPHORUS: Phosphorus: 7.3 mg/dL — ABNORMAL HIGH (ref 2.5–4.6)

## 2016-09-09 LAB — CBC
HEMATOCRIT: 31.9 % — AB (ref 39.0–52.0)
HEMOGLOBIN: 10.8 g/dL — AB (ref 13.0–17.0)
MCH: 28.3 pg (ref 26.0–34.0)
MCHC: 33.9 g/dL (ref 30.0–36.0)
MCV: 83.7 fL (ref 78.0–100.0)
Platelets: 272 10*3/uL (ref 150–400)
RBC: 3.81 MIL/uL — AB (ref 4.22–5.81)
RDW: 13.3 % (ref 11.5–15.5)
WBC: 5.4 10*3/uL (ref 4.0–10.5)

## 2016-09-09 LAB — I-STAT ARTERIAL BLOOD GAS, ED
Acid-base deficit: 6 mmol/L — ABNORMAL HIGH (ref 0.0–2.0)
Acid-base deficit: 4 mmol/L — ABNORMAL HIGH (ref 0.0–2.0)
Bicarbonate: 18.5 mmol/L — ABNORMAL LOW (ref 20.0–28.0)
Bicarbonate: 21.8 mmol/L (ref 20.0–28.0)
O2 SAT: 90 %
O2 SAT: 100 %
PCO2 ART: 31.6 mmHg — AB (ref 32.0–48.0)
PCO2 ART: 42.5 mmHg (ref 32.0–48.0)
PH ART: 7.371 (ref 7.350–7.450)
PH ART: 7.317 — AB (ref 7.350–7.450)
PO2 ART: 57 mmHg — AB (ref 83.0–108.0)
Patient temperature: 97.3
TCO2: 19 mmol/L (ref 0–100)
TCO2: 23 mmol/L (ref 0–100)
pO2, Arterial: 371 mmHg — ABNORMAL HIGH (ref 83.0–108.0)

## 2016-09-09 LAB — SALICYLATE LEVEL: Salicylate Lvl: <7 mg/dL (ref 2.8–30.0)

## 2016-09-09 LAB — ETHANOL

## 2016-09-09 LAB — RAPID URINE DRUG SCREEN, HOSP PERFORMED
Amphetamines: NOT DETECTED
BENZODIAZEPINES: NOT DETECTED
Barbiturates: NOT DETECTED
COCAINE: NOT DETECTED
OPIATES: POSITIVE — AB
TETRAHYDROCANNABINOL: POSITIVE — AB

## 2016-09-09 LAB — I-STAT TROPONIN, ED: Troponin i, poc: 0.45 ng/mL (ref 0.00–0.08)

## 2016-09-09 LAB — LIPASE, BLOOD: Lipase: 72 U/L — ABNORMAL HIGH (ref 11–51)

## 2016-09-09 LAB — AMMONIA: Ammonia: 23 umol/L (ref 9–35)

## 2016-09-09 LAB — TRIGLYCERIDES: TRIGLYCERIDES: 148 mg/dL (ref <150)

## 2016-09-09 LAB — MAGNESIUM: MAGNESIUM: 2.3 mg/dL (ref 1.7–2.4)

## 2016-09-09 LAB — LACTIC ACID, PLASMA
Lactic Acid, Venous: 0.9 mmol/L (ref 0.5–1.9)
Lactic Acid, Venous: 0.7 mmol/L (ref 0.5–1.9)

## 2016-09-09 LAB — AMYLASE: AMYLASE: 233 U/L — AB (ref 28–100)

## 2016-09-09 LAB — MRSA PCR SCREENING: MRSA by PCR: NEGATIVE

## 2016-09-09 MED ORDER — INSULIN ASPART 100 UNIT/ML ~~LOC~~ SOLN: 1.0000 [IU] | SUBCUTANEOUS | Status: DC

## 2016-09-09 MED ORDER — FENTANYL 2500MCG IN NS 250ML (10MCG/ML) PREMIX INFUSION
25.0000 ug/h | INTRAVENOUS | Status: DC
  Administered 2016-09-09: 25 ug/h via INTRAVENOUS
  Administered 2016-09-09 – 2016-09-11 (×7): 400 ug/h via INTRAVENOUS
  Administered 2016-09-12: 150 ug/h via INTRAVENOUS
  Administered 2016-09-12: 400 ug/h via INTRAVENOUS
  Filled 2016-09-09 (×10): qty 250

## 2016-09-09 MED ORDER — SUCCINYLCHOLINE CHLORIDE 20 MG/ML IJ SOLN
100.0000 mg | Freq: Once | INTRAMUSCULAR | Status: AC
  Administered 2016-09-09: 100 mg via INTRAVENOUS
  Filled 2016-09-09: qty 5

## 2016-09-09 MED ORDER — FUROSEMIDE 10 MG/ML IJ SOLN
40.0000 mg | Freq: Once | INTRAMUSCULAR | Status: AC
  Administered 2016-09-09: 40 mg via INTRAVENOUS
  Filled 2016-09-09: qty 4

## 2016-09-09 MED ORDER — NALOXONE HCL 2 MG/2ML IJ SOSY
2.0000 mg | PREFILLED_SYRINGE | INTRAMUSCULAR | Status: DC | PRN
  Administered 2016-09-09: 2 mg via INTRAVENOUS
  Filled 2016-09-09 (×2): qty 2

## 2016-09-09 MED ORDER — FENTANYL BOLUS VIA INFUSION
50.0000 ug | INTRAVENOUS | Status: DC | PRN
  Administered 2016-09-09: 50 ug via INTRAVENOUS
  Filled 2016-09-09: qty 50

## 2016-09-09 MED ORDER — IPRATROPIUM-ALBUTEROL 0.5-2.5 (3) MG/3ML IN SOLN
3.0000 mL | Freq: Four times a day (QID) | RESPIRATORY_TRACT | Status: DC
  Administered 2016-09-09 – 2016-09-13 (×17): 3 mL via RESPIRATORY_TRACT
  Filled 2016-09-09 (×15): qty 3

## 2016-09-09 MED ORDER — CHLORHEXIDINE GLUCONATE 0.12% ORAL RINSE (MEDLINE KIT)
15.0000 mL | Freq: Two times a day (BID) | OROMUCOSAL | Status: DC
  Administered 2016-09-09 – 2016-09-12 (×6): 15 mL via OROMUCOSAL

## 2016-09-09 MED ORDER — ASPIRIN 81 MG PO CHEW: 324.0000 mg | CHEWABLE_TABLET | ORAL | Status: AC

## 2016-09-09 MED ORDER — PROPOFOL 1000 MG/100ML IV EMUL
INTRAVENOUS | Status: AC
  Filled 2016-09-09: qty 100

## 2016-09-09 MED ORDER — IPRATROPIUM-ALBUTEROL 0.5-2.5 (3) MG/3ML IN SOLN
RESPIRATORY_TRACT | Status: AC
  Filled 2016-09-09: qty 3

## 2016-09-09 MED ORDER — METOPROLOL TARTRATE 5 MG/5ML IV SOLN
INTRAVENOUS | Status: AC
  Filled 2016-09-09: qty 5

## 2016-09-09 MED ORDER — NITROGLYCERIN IN D5W 200-5 MCG/ML-% IV SOLN
0.0000 ug/min | Freq: Once | INTRAVENOUS | Status: AC
  Administered 2016-09-09: 5 ug/min via INTRAVENOUS
  Filled 2016-09-09: qty 250

## 2016-09-09 MED ORDER — ORAL CARE MOUTH RINSE
15.0000 mL | Freq: Four times a day (QID) | OROMUCOSAL | Status: DC
  Administered 2016-09-09 – 2016-09-12 (×11): 15 mL via OROMUCOSAL

## 2016-09-09 MED ORDER — SODIUM CHLORIDE 0.9 % IV SOLN
10.0000 mg | Freq: Two times a day (BID) | INTRAVENOUS | Status: DC
  Administered 2016-09-09 – 2016-09-12 (×7): 10 mg via INTRAVENOUS
  Filled 2016-09-09 (×7): qty 1

## 2016-09-09 MED ORDER — NICARDIPINE HCL IN NACL 20-0.86 MG/200ML-% IV SOLN
3.0000 mg/h | INTRAVENOUS | Status: DC
  Administered 2016-09-09: 3 mg/h via INTRAVENOUS
  Administered 2016-09-09: 5 mg/h via INTRAVENOUS
  Administered 2016-09-10: 3 mg/h via INTRAVENOUS
  Administered 2016-09-10: 8 mg/h via INTRAVENOUS
  Administered 2016-09-10: 7 mg/h via INTRAVENOUS
  Administered 2016-09-10: 10 mg/h via INTRAVENOUS
  Administered 2016-09-10: 3 mg/h via INTRAVENOUS
  Administered 2016-09-11: 2 mg/h via INTRAVENOUS
  Administered 2016-09-11 (×2): 3 mg/h via INTRAVENOUS
  Filled 2016-09-09 (×11): qty 200

## 2016-09-09 MED ORDER — ETOMIDATE 2 MG/ML IV SOLN
0.3000 mg/kg | Freq: Once | INTRAVENOUS | Status: AC
  Administered 2016-09-09: 22.14 mg via INTRAVENOUS

## 2016-09-09 MED ORDER — CALCIUM ACETATE (PHOS BINDER) 667 MG PO CAPS
1334.0000 mg | ORAL_CAPSULE | Freq: Three times a day (TID) | ORAL | Status: DC
  Administered 2016-09-10 – 2016-09-20 (×22): 1334 mg via ORAL
  Filled 2016-09-09 (×25): qty 2

## 2016-09-09 MED ORDER — FUROSEMIDE 10 MG/ML IJ SOLN
160.0000 mg | Freq: Four times a day (QID) | INTRAVENOUS | Status: DC
  Administered 2016-09-09 – 2016-09-13 (×15): 160 mg via INTRAVENOUS
  Filled 2016-09-09 (×18): qty 16

## 2016-09-09 MED ORDER — PROPOFOL 1000 MG/100ML IV EMUL
0.0000 ug/kg/min | INTRAVENOUS | Status: DC
  Administered 2016-09-09: 10 ug/kg/min via INTRAVENOUS
  Administered 2016-09-09: 40 ug/kg/min via INTRAVENOUS
  Administered 2016-09-09: 50 ug/kg/min via INTRAVENOUS
  Administered 2016-09-09 – 2016-09-10 (×4): 40 ug/kg/min via INTRAVENOUS
  Administered 2016-09-10: 45 ug/kg/min via INTRAVENOUS
  Administered 2016-09-10: 40 ug/kg/min via INTRAVENOUS
  Administered 2016-09-11: 45 ug/kg/min via INTRAVENOUS
  Administered 2016-09-11: 44.941 ug/kg/min via INTRAVENOUS
  Administered 2016-09-11: 50 ug/kg/min via INTRAVENOUS
  Administered 2016-09-11: 49.458 ug/kg/min via INTRAVENOUS
  Administered 2016-09-12: 50 ug/kg/min via INTRAVENOUS
  Administered 2016-09-12: 35 ug/kg/min via INTRAVENOUS
  Filled 2016-09-09 (×6): qty 100
  Filled 2016-09-09: qty 200
  Filled 2016-09-09 (×6): qty 100

## 2016-09-09 MED ORDER — FENTANYL CITRATE (PF) 100 MCG/2ML IJ SOLN
50.0000 ug | Freq: Once | INTRAMUSCULAR | Status: DC
  Filled 2016-09-09: qty 2

## 2016-09-09 MED ORDER — HEPARIN SODIUM (PORCINE) 5000 UNIT/ML IJ SOLN
5000.0000 [IU] | Freq: Three times a day (TID) | INTRAMUSCULAR | Status: DC
  Administered 2016-09-09 – 2016-09-20 (×21): 5000 [IU] via SUBCUTANEOUS
  Filled 2016-09-09 (×23): qty 1

## 2016-09-09 MED ORDER — SODIUM CHLORIDE 0.9 % IV SOLN
250.0000 mL | INTRAVENOUS | Status: DC | PRN
  Administered 2016-09-09: 250 mL via INTRAVENOUS

## 2016-09-09 MED ORDER — ASPIRIN 300 MG RE SUPP: 300.0000 mg | RECTAL | Status: AC

## 2016-09-09 MED ORDER — HYDRALAZINE HCL 20 MG/ML IJ SOLN
10.0000 mg | INTRAMUSCULAR | Status: DC | PRN
  Administered 2016-09-09: 10 mg via INTRAVENOUS
  Filled 2016-09-09: qty 1

## 2016-09-09 MED ORDER — LORAZEPAM 2 MG/ML IJ SOLN
1.0000 mg | Freq: Once | INTRAMUSCULAR | Status: AC
  Administered 2016-09-09: 1 mg via INTRAVENOUS
  Filled 2016-09-09: qty 1

## 2016-09-09 MED ORDER — HYDRALAZINE HCL 20 MG/ML IJ SOLN
10.0000 mg | INTRAMUSCULAR | Status: DC | PRN
  Administered 2016-09-09 – 2016-09-12 (×3): 40 mg via INTRAVENOUS
  Administered 2016-09-14 (×2): 10 mg via INTRAVENOUS
  Administered 2016-09-14 – 2016-09-15 (×2): 20 mg via INTRAVENOUS
  Administered 2016-09-15: 40 mg via INTRAVENOUS
  Administered 2016-09-16: 20 mg via INTRAVENOUS
  Administered 2016-09-16 – 2016-09-17 (×2): 10 mg via INTRAVENOUS
  Filled 2016-09-09: qty 1
  Filled 2016-09-09 (×4): qty 2
  Filled 2016-09-09 (×6): qty 1

## 2016-09-09 MED ORDER — METOPROLOL TARTRATE 5 MG/5ML IV SOLN
5.0000 mg | Freq: Once | INTRAVENOUS | Status: AC
  Administered 2016-09-09: 5 mg via INTRAVENOUS

## 2016-09-09 NOTE — ED Provider Notes (Signed)
[facility] DEPT Provider Note   CSN: [id number] Arrival date & time: 09/09/16  0448     History   Chief Complaint Chief Complaint  Patient presents with  . Shortness of Breath  . took unknown pill   LEVEL 5 CAVEAT DUE TO ACUITY OF CONDITION  HPI Timothy [name] is a 55 y.o. male.  The history is provided by the patient.  Shortness of Breath  This is a new problem. The problem occurs continuously.The problem has been rapidly worsening. He has tried nothing for the symptoms. Associated medical issues include heart failure.  Patient presents with acute shortness of breath He reports he was having back pain and took an "unknown pill" he purchased off the street.  Soon after he developed shortness of breath Family at bedside confirms story but no other details are available  Past Medical History:  Diagnosis Date  . Acute diastolic heart failure ([place]) 07/17/2016  . BPH (benign prostatic hyperplasia)   . Colon cancer ([place]) 2014  . Elevated troponin   . Hypertension     Patient Active Problem List   Diagnosis Date Noted  . Hypertensive heart disease 07/19/2016  . Chest pain   . Elevated troponin 07/18/2016  . Acute diastolic heart failure ([place]) 07/17/2016  . AKI (acute kidney injury) ([place]) 07/17/2016  . Heroin abuse 07/17/2016  . HTN (hypertension) 07/17/2016  . Benign prostate hyperplasia 07/16/2015    Past Surgical History:  Procedure Laterality Date  . ABDOMINAL SURGERY    . COLON SURGERY  2014       Home Medications    Prior to Admission medications   Medication Sig Start Date End Date Taking? Authorizing Provider  acetaminophen (TYLENOL) 325 MG tablet Take 650 mg by mouth every 6 (six) hours as needed for mild pain.    Historical Provider, MD  amLODipine (NORVASC) 10 MG tablet Take 1 tablet (10 mg total) by mouth daily. 07/22/16   Silver Huguenin Elgergawy, MD  cyclobenzaprine (FLEXERIL) 10 MG tablet Take 1 tablet (10 mg total) by mouth 3 (three) times daily as  needed for muscle spasms. Patient not taking: Reported on 07/17/2016 07/13/15   Melony Overly, MD  folic acid (FOLVITE) 1 MG tablet Take 1 tablet (1 mg total) by mouth daily. 07/23/16   Silver Huguenin Elgergawy, MD  HYDROcodone-acetaminophen (NORCO/VICODIN) 5-325 MG tablet Take 1 tablet by mouth every 4 (four) hours as needed for severe pain. 07/22/16   Silver Huguenin Elgergawy, MD  isosorbide-hydrALAZINE (BIDIL) 20-37.5 MG tablet Take 1 tablet by mouth 2 (two) times daily. 07/22/16   Silver Huguenin Elgergawy, MD  labetalol (NORMODYNE) 200 MG tablet Take 1 tablet (200 mg total) by mouth 2 (two) times daily. 07/22/16   Silver Huguenin Elgergawy, MD  Multiple Vitamin (MULTIVITAMIN WITH MINERALS) TABS tablet Take 1 tablet by mouth daily. 07/23/16   Silver Huguenin Elgergawy, MD  pravastatin (PRAVACHOL) 40 MG tablet Take 1 tablet (40 mg total) by mouth daily. Patient not taking: Reported on 07/17/2016 09/13/15   Micheline Chapman, NP  spironolactone (ALDACTONE) 25 MG tablet Take 1 tablet (25 mg total) by mouth 2 (two) times daily. 07/22/16   Silver Huguenin Elgergawy, MD  tamsulosin (FLOMAX) 0.4 MG CAPS capsule Take 1 capsule (0.4 mg total) by mouth daily. Patient not taking: Reported on 07/17/2016 09/13/15   Micheline Chapman, NP  thiamine 100 MG tablet Take 1 tablet (100 mg total) by mouth daily. 07/23/16   Albertine Patricia, MD    Family History Family  History  Problem Relation Age of Onset  . Heart disease Mother     Died at age 57.  Marland Kitchen Heart failure Mother   . Kidney failure Sister     Social History Social History  Substance Use Topics  . Smoking status: Current Every Day Smoker    Packs/day: 0.25    Types: Cigarettes  . Smokeless tobacco: Never Used  . Alcohol use 4.2 oz/week    7 Cans of beer per week     Comment: drinks daily 1-2 beers     Allergies   Patient has no known allergies.   Review of Systems Review of Systems  Unable to perform ROS: Acuity of condition  Respiratory: Positive for shortness of breath.       Physical Exam Updated Vital Signs BP (!) 214/179   Pulse 99   Resp 24   SpO2 94%  Temp - 70F  Physical Exam CONSTITUTIONAL: ill appearing, tachypneic HEAD: Normocephalic/atraumatic EYES: EOMI/PERRL ENMT: Mucous membranes moist NECK: supple no meningeal signs SPINE/BACK:entire spine nontender CV: tachycardic, no loud murmurs LUNGS: tachypneic, crackles bilaterally, unable to speak completely ABDOMEN: soft, nontender GU:no cva tenderness NEURO: Pt is awake/alert/appropriate, moves all extremitiesx4. EXTREMITIES: pulses normal/equal, full ROM, no LE edema SKIN: warm, color normal PSYCH: anxious  ED Treatments / Results  Labs (all labs ordered are listed, but only abnormal results are displayed) Labs Reviewed  CBC - Abnormal; Notable for the following:       Result Value   RBC 3.81 (*)    Hemoglobin 10.8 (*)    HCT 31.9 (*)    All other components within normal limits  COMPREHENSIVE METABOLIC PANEL - Abnormal; Notable for the following:    CO2 18 (*)    BUN 109 (*)    Creatinine, Ser 8.35 (*)    Calcium 7.7 (*)    Total Protein 6.2 (*)    Albumin 3.1 (*)    AST 45 (*)    GFR calc non Af Amer 6 (*)    GFR calc Af Amer 7 (*)    Anion gap 16 (*)    All other components within normal limits  ACETAMINOPHEN LEVEL - Abnormal; Notable for the following:    Acetaminophen (Tylenol), Serum <10 (*)    All other components within normal limits  RAPID URINE DRUG SCREEN, HOSP PERFORMED - Abnormal; Notable for the following:    Opiates POSITIVE (*)    Tetrahydrocannabinol POSITIVE (*)    All other components within normal limits  TROPONIN I - Abnormal; Notable for the following:    Troponin I 0.42 (*)    All other components within normal limits  BRAIN NATRIURETIC PEPTIDE - Abnormal; Notable for the following:    B Natriuretic Peptide 3,212.6 (*)    All other components within normal limits  I-STAT TROPOININ, ED - Abnormal; Notable for the following:    Troponin i, poc  0.45 (*)    All other components within normal limits  I-STAT ARTERIAL BLOOD GAS, ED - Abnormal; Notable for the following:    pCO2 arterial 31.6 (*)    pO2, Arterial 57.0 (*)    Bicarbonate 18.5 (*)    Acid-base deficit 6.0 (*)    All other components within normal limits  ETHANOL  SALICYLATE LEVEL    EKG  EKG Interpretation  Date/Time:  Saturday September 09 2016 05:00:33 EST Ventricular Rate:  92 PR Interval:  156 QRS Duration: 82 QT Interval:  400 QTC Calculation: 494 R Axis:  60 Text Interpretation:  Normal sinus rhythm Possible Left atrial enlargement Septal infarct , age undetermined Abnormal ECG Interpretation limited secondary to artifact No significant change since last tracing Confirmed by Christy Gentles  MD, [place] ([id number]) on 09/09/2016 5:55:09 AM       Radiology Dg Chest Portable 1 View  Result Date: 09/09/2016 CLINICAL DATA:  Shortness of breath after taking an unknown pain pill. EXAM: PORTABLE CHEST 1 VIEW COMPARISON:  07/19/2016 FINDINGS: Cardiac enlargement with pulmonary vascular congestion and diffuse interstitial edema. Edema pattern is increased since the previous study. No focal consolidation. No blunting of costophrenic angles. No pneumothorax. Calcification of the aorta. IMPRESSION: Cardiac enlargement with pulmonary vascular congestion and increased edema since previous study. Electronically Signed   By: Lucienne Capers M.D.   On: 09/09/2016 06:06    Procedures Procedures  CRITICAL CARE Performed by: Sharyon Cable Total critical care time: 40 minutes Critical care time was exclusive of separately billable procedures and treating other patients. Critical care was necessary to treat or prevent imminent or life-threatening deterioration. Critical care was time spent personally by me on the following activities: development of treatment plan with patient and/or surrogate as well as nursing, discussions with consultants, evaluation of patient's response to  treatment, examination of patient, obtaining history from patient or surrogate, ordering and performing treatments and interventions, ordering and review of laboratory studies, ordering and review of radiographic studies, pulse oximetry and re-evaluation of patient's condition. PATIENT WITH RESPIRATORY FAILURE REQUIRING BIPAP, IV NITROGLYCERIN AND IV LASIX  Medications Ordered in ED Medications  naloxone (NARCAN) injection 2 mg (2 mg Intravenous Given 09/09/16 0653)  LORazepam (ATIVAN) injection 1 mg (not administered)  nitroGLYCERIN 50 mg in dextrose 5 % 250 mL (0.2 mg/mL) infusion (65 mcg/min Intravenous Rate/Dose Change 09/09/16 0712)  furosemide (LASIX) injection 40 mg (40 mg Intravenous Given 09/09/16 0545)     Initial Impression / Assessment and Plan / ED Course  I have reviewed the triage vital signs and the nursing notes.  Pertinent labs & imaging results that were available during my care of the patient were reviewed by me and considered in my medical decision making (see chart for details).  Clinical Course    6:34 AM Pt seen for SOB after taking unknown medication He has h/o CHF previously.  He is clearly in pulmonary edema I started nitro and lasix, but despite this he was tachypneic He was placed on bipap 7:04 AM PT NOW MORE SOMNOLENT PUPILS PINPOINT SUSPECT THIS IS DRUG RELATED HE WAS GIVEN NARCAN WITH IMMEDIATE RESPONSE AND HE WOKE UP AGITATED WILL CONTINUE TO MONITOR AND CALL FOR ADMISSION 7:21 AM Pt resting comfortably, easily arousable He continues with hypertension, NTG drip continued D/w triad team for admission to stepdown unit   Final Clinical Impressions(s) / ED Diagnoses   Final diagnoses:  Acute respiratory failure, unspecified whether with hypoxia or hypercapnia ([place])  Acute pulmonary edema ([facility])  Drug ingestion, undetermined intent, initial encounter  AKI (acute kidney injury) [facility])    New Prescriptions New Prescriptions   No medications on  file     Ripley Fraise, MD 09/09/16 [phone]

## 2016-09-09 NOTE — ED Notes (Signed)
Hospitalist recommending intubation as well as head CT at this time.

## 2016-09-09 NOTE — ED Notes (Addendum)
Pt. Given 20mg  bolus of propofol with EDP verbal order

## 2016-09-09 NOTE — Progress Notes (Signed)
Dr. Corrie Dandy notified of BP 151/109 after Hydralazine 10mg  given.  Heart rate = 70bpm.  Orders received.  Will continue to monitor closely.

## 2016-09-09 NOTE — ED Notes (Signed)
CCM at bedside 

## 2016-09-09 NOTE — Consult Note (Signed)
Timothy [name] is a 55 y.o. male with medical history significant for poorly controlled hypertension, polysubstance abuse and progressive chronic kidney disease.  Patient was recently hospitalized and subsequently discharged on 10/7 after admission for chest pain with cough. At that presentation he was found to have mild pulmonary edema and a new increase serum creatinine from a baseline of 1.59 in 2016 to 4.88. Renal ultrasound revealed echogenic kidneys.. At time of discharge on 10/7 his creatinine was 5.61. An outpt appointment was scheduled with Dr. Moshe Cipro on 10/30. He presented to the ER 10/24 complaining of shortness of breath that began just after taking an unknown "pain peel". Since arrival to the ER his heads difficult to control blood pressure with average range of blood pressure 220s/160s. Chest x-ray revealed cardiac enlargement with pulmonary vascular congestion and increased edema since previous x-ray on 10/4. Lab data revealed worsened renal function with creatinine increasing from 5.61 to 8.35.  Patient was placed on BiPAP. An ABG was performed with normal pH, PCO2 of 32 and a PO2 of 57. Tylenol and salicylate levels were normal.  Urine drug screen was positive for opiates and THC, alcohol level was less than 5. A blood pressure reading of 210/163.  He had altered mental status and was intubated for airway protection.   Past Medical History:  Diagnosis Date  . Acute diastolic heart failure ([place]) 07/17/2016  . BPH (benign prostatic hyperplasia)   . Colon cancer ([place]) 2014  . Elevated troponin   . Hypertension    Past Surgical History:  Procedure Laterality Date  . ABDOMINAL SURGERY    . COLON SURGERY  2014   Social History:  reports that he has been smoking Cigarettes.  He has been smoking about 0.25 packs per day. He has never used smokeless tobacco. He reports that he drinks about 4.2 oz of alcohol per week . He reports that he uses drugs, including Marijuana and Heroin, about  1 time per week. Allergies: No Known Allergies Family History  Problem Relation Age of Onset  . Heart disease Mother     Died at age 50.  Marland Kitchen Heart failure Mother   . Kidney failure Sister     Medications:  Scheduled: . aspirin  324 mg Oral NOW   Or  . aspirin  300 mg Rectal NOW  . chlorhexidine gluconate (MEDLINE KIT)  15 mL Mouth Rinse BID  . famotidine (PEPCID) IV  10 mg Intravenous Q12H  . fentaNYL (SUBLIMAZE) injection  50 mcg Intravenous Once  . heparin subcutaneous  5,000 Units Subcutaneous Q8H  . insulin aspart  1-3 Units Subcutaneous Q4H  . ipratropium-albuterol  3 mL Nebulization QID  . ipratropium-albuterol      . mouth rinse  15 mL Mouth Rinse QID  . propofol       Continuous: . fentaNYL infusion INTRAVENOUS 400 mcg/hr (09/09/16 1410)  . niCARDipine Stopped (09/09/16 0927)  . propofol (DIPRIVAN) infusion 40 mcg/kg/min (09/09/16 1322)     ROS: unobtainable due to VDRF Blood pressure (!) 134/100, pulse 62, temperature (!) 96 F (35.6 C), temperature source Oral, resp. rate 16, height '6\' 2"'  (1.88 m), weight 73.8 kg (162 lb 11.2 oz), SpO2 100 %.  General appearance: sedated Head: Normocephalic, without obvious abnormality, atraumatic, oral intubation Eyes: conjunctivae/corneas clear. PERRL, EOM's intact. Fundi benign. Ears: normal TM's and external ear canals both ears Nose: Nares normal. Septum midline. Mucosa normal. No drainage or sinus tenderness. Throat: lips, mucosa, and tongue normal; teeth and gums normal Resp: clear  to auscultation bilaterally Chest wall: no tenderness Cardio: regular rate and rhythm, S1, S2 normal, no murmur, click, rub or gallop GI: soft, non-tender; bowel sounds normal; no masses,  no organomegaly Extremities: extremities normal, atraumatic, no cyanosis or edema Skin: Skin color, texture, turgor normal. No rashes or lesions Neurologic: sedated Results for orders placed or performed during the hospital encounter of 09/09/16 (from the  past 48 hour(s))  CBC     Status: Abnormal   Collection Time: 09/09/16  5:08 AM  Result Value Ref Range   WBC 5.4 4.0 - 10.5 K/uL   RBC 3.81 (L) 4.22 - 5.81 MIL/uL   Hemoglobin 10.8 (L) 13.0 - 17.0 g/dL   HCT 31.9 (L) 39.0 - 52.0 %   MCV 83.7 78.0 - 100.0 fL   MCH 28.3 26.0 - 34.0 pg   MCHC 33.9 30.0 - 36.0 g/dL   RDW 13.3 11.5 - 15.5 %   Platelets 272 150 - 400 K/uL  Comprehensive metabolic panel     Status: Abnormal   Collection Time: 09/09/16  5:09 AM  Result Value Ref Range   Sodium 137 135 - 145 mmol/L   Potassium 3.6 3.5 - 5.1 mmol/L   Chloride 103 101 - 111 mmol/L   CO2 18 (L) 22 - 32 mmol/L   Glucose, Bld 89 65 - 99 mg/dL   BUN 109 (H) 6 - 20 mg/dL   Creatinine, Ser 8.35 (H) 0.61 - 1.24 mg/dL   Calcium 7.7 (L) 8.9 - 10.3 mg/dL   Total Protein 6.2 (L) 6.5 - 8.1 g/dL   Albumin 3.1 (L) 3.5 - 5.0 g/dL   AST 45 (H) 15 - 41 U/L   ALT 34 17 - 63 U/L   Alkaline Phosphatase 63 38 - 126 U/L   Total Bilirubin 0.3 0.3 - 1.2 mg/dL   GFR calc non Af Amer 6 (L) >60 mL/min   GFR calc Af Amer 7 (L) >60 mL/min    Comment: (NOTE) The eGFR has been calculated using the CKD EPI equation. This calculation has not been validated in all clinical situations. eGFR's persistently <60 mL/min signify possible Chronic Kidney Disease.    Anion gap 16 (H) 5 - 15  Ethanol     Status: None   Collection Time: 09/09/16  5:09 AM  Result Value Ref Range   Alcohol, Ethyl (B) <5 <5 mg/dL    Comment:        LOWEST DETECTABLE LIMIT FOR SERUM ALCOHOL IS 5 mg/dL FOR MEDICAL PURPOSES ONLY   Salicylate level     Status: None   Collection Time: 09/09/16  5:09 AM  Result Value Ref Range   Salicylate Lvl <1.9 2.8 - 30.0 mg/dL  Acetaminophen level     Status: Abnormal   Collection Time: 09/09/16  5:09 AM  Result Value Ref Range   Acetaminophen (Tylenol), Serum <10 (L) 10 - 30 ug/mL    Comment:        THERAPEUTIC CONCENTRATIONS VARY SIGNIFICANTLY. A RANGE OF 10-30 ug/mL MAY BE AN  EFFECTIVE CONCENTRATION FOR MANY PATIENTS. HOWEVER, SOME ARE BEST TREATED AT CONCENTRATIONS OUTSIDE THIS RANGE. ACETAMINOPHEN CONCENTRATIONS >150 ug/mL AT 4 HOURS AFTER INGESTION AND >50 ug/mL AT 12 HOURS AFTER INGESTION ARE OFTEN ASSOCIATED WITH TOXIC REACTIONS.   I-stat troponin, ED     Status: Abnormal   Collection Time: 09/09/16  5:21 AM  Result Value Ref Range   Troponin i, poc 0.45 (HH) 0.00 - 0.08 ng/mL   Comment NOTIFIED PHYSICIAN  Comment 3            Comment: Due to the release kinetics of cTnI, a negative result within the first hours of the onset of symptoms does not rule out myocardial infarction with certainty. If myocardial infarction is still suspected, repeat the test at appropriate intervals.   Troponin I     Status: Abnormal   Collection Time: 09/09/16  5:37 AM  Result Value Ref Range   Troponin I 0.42 (HH) <0.03 ng/mL    Comment: CRITICAL RESULT CALLED TO, READ BACK BY AND VERIFIED WITH: PHELPS C,RN 09/09/16 0617 WAYK   Brain natriuretic peptide     Status: Abnormal   Collection Time: 09/09/16  5:37 AM  Result Value Ref Range   B Natriuretic Peptide 3,212.6 (H) 0.0 - 100.0 pg/mL  Rapid urine drug screen (hospital performed)     Status: Abnormal   Collection Time: 09/09/16  6:33 AM  Result Value Ref Range   Opiates POSITIVE (A) NONE DETECTED   Cocaine NONE DETECTED NONE DETECTED   Benzodiazepines NONE DETECTED NONE DETECTED   Amphetamines NONE DETECTED NONE DETECTED   Tetrahydrocannabinol POSITIVE (A) NONE DETECTED   Barbiturates NONE DETECTED NONE DETECTED    Comment:        DRUG SCREEN FOR MEDICAL PURPOSES ONLY.  IF CONFIRMATION IS NEEDED FOR ANY PURPOSE, NOTIFY LAB WITHIN 5 DAYS.        LOWEST DETECTABLE LIMITS FOR URINE DRUG SCREEN Drug Class       Cutoff (ng/mL) Amphetamine      1000 Barbiturate      200 Benzodiazepine   656 Tricyclics       812 Opiates          300 Cocaine          300 THC              50   I-Stat arterial blood  gas, ED     Status: Abnormal   Collection Time: 09/09/16  6:56 AM  Result Value Ref Range   pH, Arterial 7.371 7.350 - 7.450   pCO2 arterial 31.6 (L) 32.0 - 48.0 mmHg   pO2, Arterial 57.0 (L) 83.0 - 108.0 mmHg   Bicarbonate 18.5 (L) 20.0 - 28.0 mmol/L   TCO2 19 0 - 100 mmol/L   O2 Saturation 90.0 %   Acid-base deficit 6.0 (H) 0.0 - 2.0 mmol/L   Patient temperature 97.3 F    Collection site RADIAL, ALLEN'S TEST ACCEPTABLE    Drawn by Operator    Sample type ARTERIAL   Triglycerides     Status: None   Collection Time: 09/09/16  8:28 AM  Result Value Ref Range   Triglycerides 148 <150 mg/dL  I-Stat arterial blood gas, ED     Status: Abnormal   Collection Time: 09/09/16  9:31 AM  Result Value Ref Range   pH, Arterial 7.317 (L) 7.350 - 7.450   pCO2 arterial 42.5 32.0 - 48.0 mmHg   pO2, Arterial 371.0 (H) 83.0 - 108.0 mmHg   Bicarbonate 21.8 20.0 - 28.0 mmol/L   TCO2 23 0 - 100 mmol/L   O2 Saturation 100.0 %   Acid-base deficit 4.0 (H) 0.0 - 2.0 mmol/L   Patient temperature HIDE    Collection site RADIAL, ALLEN'S TEST ACCEPTABLE    Drawn by RT    Sample type ARTERIAL   Lactic acid, plasma     Status: None   Collection Time: 09/09/16  9:43 AM  Result Value Ref  Range   Lactic Acid, Venous 0.9 0.5 - 1.9 mmol/L  Magnesium     Status: None   Collection Time: 09/09/16  9:43 AM  Result Value Ref Range   Magnesium 2.3 1.7 - 2.4 mg/dL  Phosphorus     Status: Abnormal   Collection Time: 09/09/16  9:43 AM  Result Value Ref Range   Phosphorus 7.3 (H) 2.5 - 4.6 mg/dL  Amylase     Status: Abnormal   Collection Time: 09/09/16  9:43 AM  Result Value Ref Range   Amylase 233 (H) 28 - 100 U/L  Lipase, blood     Status: Abnormal   Collection Time: 09/09/16  9:43 AM  Result Value Ref Range   Lipase 72 (H) 11 - 51 U/L  Ammonia     Status: None   Collection Time: 09/09/16  9:43 AM  Result Value Ref Range   Ammonia 23 9 - 35 umol/L  Troponin I     Status: Abnormal   Collection Time:  09/09/16  9:43 AM  Result Value Ref Range   Troponin I 0.44 (HH) <0.03 ng/mL    Comment: CRITICAL VALUE NOTED.  VALUE IS CONSISTENT WITH PREVIOUSLY REPORTED AND CALLED VALUE.  Culture, blood (routine x 2)     Status: None (Preliminary result)   Collection Time: 09/09/16  9:55 AM  Result Value Ref Range   Specimen Description BLOOD RIGHT HAND    Special Requests BOTTLES DRAWN AEROBIC AND ANAEROBIC  10CC    Culture NO GROWTH < 12 HOURS    Report Status PENDING   Culture, blood (routine x 2)     Status: None (Preliminary result)   Collection Time: 09/09/16  9:58 AM  Result Value Ref Range   Specimen Description BLOOD LEFT HAND    Special Requests BOTTLES DRAWN AEROBIC AND ANAEROBIC  5CC    Culture NO GROWTH < 12 HOURS    Report Status PENDING   MRSA PCR Screening     Status: None   Collection Time: 09/09/16 10:52 AM  Result Value Ref Range   MRSA by PCR NEGATIVE NEGATIVE    Comment:        The GeneXpert MRSA Assay (FDA approved for NASAL specimens only), is one component of a comprehensive MRSA colonization surveillance program. It is not intended to diagnose MRSA infection nor to guide or monitor treatment for MRSA infections.   Glucose, capillary     Status: None   Collection Time: 09/09/16 11:38 AM  Result Value Ref Range   Glucose-Capillary 93 65 - 99 mg/dL   Comment 1 Notify RN   Lactic acid, plasma     Status: None   Collection Time: 09/09/16 12:38 PM  Result Value Ref Range   Lactic Acid, Venous 0.7 0.5 - 1.9 mmol/L  Basic metabolic panel     Status: Abnormal   Collection Time: 09/09/16 12:38 PM  Result Value Ref Range   Sodium 139 135 - 145 mmol/L   Potassium 3.9 3.5 - 5.1 mmol/L   Chloride 107 101 - 111 mmol/L   CO2 18 (L) 22 - 32 mmol/L   Glucose, Bld 84 65 - 99 mg/dL   BUN 114 (H) 6 - 20 mg/dL   Creatinine, Ser 8.21 (H) 0.61 - 1.24 mg/dL   Calcium 7.5 (L) 8.9 - 10.3 mg/dL   GFR calc non Af Amer 7 (L) >60 mL/min   GFR calc Af Amer 8 (L) >60 mL/min     Comment: (NOTE) The eGFR has  been calculated using the CKD EPI equation. This calculation has not been validated in all clinical situations. eGFR's persistently <60 mL/min signify possible Chronic Kidney Disease.    Anion gap 14 5 - 15  Troponin I     Status: Abnormal   Collection Time: 09/09/16  2:39 PM  Result Value Ref Range   Troponin I 0.41 (HH) <0.03 ng/mL    Comment: CRITICAL VALUE NOTED.  VALUE IS CONSISTENT WITH PREVIOUSLY REPORTED AND CALLED VALUE.  Glucose, capillary     Status: None   Collection Time: 09/09/16  3:38 PM  Result Value Ref Range   Glucose-Capillary 80 65 - 99 mg/dL   Comment 1 Notify RN    Ct Head Wo Contrast  Result Date: 09/09/2016 CLINICAL DATA:  Hypertensive emergency.  Shortness of breath. EXAM: CT HEAD WITHOUT CONTRAST TECHNIQUE: Contiguous axial images were obtained from the base of the skull through the vertex without intravenous contrast. COMPARISON:  None. FINDINGS: Brain: Ventricles are normal in size and configuration. All areas of the brain demonstrate normal gray-white matter attenuation. There is no mass, hemorrhage, edema or other evidence of acute parenchymal abnormality. No extra-axial hemorrhage. Vascular: There are chronic calcified atherosclerotic changes of the large vessels at the skull base. No unexpected hyperdense vessel. Skull: Normal. Negative for fracture or focal lesion. Sinuses/Orbits: Mild mucosal thickening within the ethmoid air cells. No fluid levels or other signs of acute sinusitis. Periorbital and retro-orbital soft tissues are unremarkable. Other: None. IMPRESSION: No acute findings.  No intracranial mass, hemorrhage or edema. Electronically Signed   By: Franki Cabot M.D.   On: 09/09/2016 11:02   Dg Chest Portable 1 View  Result Date: 09/09/2016 CLINICAL DATA:  Intubation EXAM: PORTABLE CHEST 1 VIEW COMPARISON:  Chest x-ray from earlier same day and chest x-ray dated 07/19/2016. FINDINGS: Endotracheal tube has been placed  with tip located at the level of the clavicles, approximately 7 cm above the carina. Enteric tube passes below the diaphragm although the proximal side holes are at the level of the gastroesophageal junction. There is mild cardiomegaly. The central pulmonary vascular congestion and bilateral pulmonary edema pattern is stable or slightly improved. No pleural effusion or pneumothorax seen. IMPRESSION: 1. Endotracheal tube is adequately positioned with tip at the level of the clavicles, but tip is approximately 7 cm above the carina. Would consider advancing approximately 4-5 cm for more optimal radiographic positioning. 2. Enteric tube passes below the diaphragm, however, proximal side holes are at the level of the gastroesophageal junction. Recommend advancing at least 5 cm for more optimal radiographic positioning. 3. Cardiomegaly with central pulmonary vascular congestion and bilateral pulmonary edema pattern, consistent with CHF/volume overload, stable or slightly improved compared to the chest x-ray from earlier today. Electronically Signed   By: Franki Cabot M.D.   On: 09/09/2016 09:33   Dg Chest Portable 1 View  Result Date: 09/09/2016 CLINICAL DATA:  Shortness of breath after taking an unknown pain pill. EXAM: PORTABLE CHEST 1 VIEW COMPARISON:  07/19/2016 FINDINGS: Cardiac enlargement with pulmonary vascular congestion and diffuse interstitial edema. Edema pattern is increased since the previous study. No focal consolidation. No blunting of costophrenic angles. No pneumothorax. Calcification of the aorta. IMPRESSION: Cardiac enlargement with pulmonary vascular congestion and increased edema since previous study. Electronically Signed   By: Lucienne Capers M.D.   On: 09/09/2016 06:06   Dg Abd Portable 1 View  Result Date: 09/09/2016 CLINICAL DATA:  OG tube placement. EXAM: PORTABLE ABDOMEN - 1 VIEW COMPARISON:  None. FINDINGS: Enteric tube passes below the diaphragm, however, proximal side holes are  at the level of the gastroesophageal junction. Visualized bowel gas pattern is nonobstructive. No evidence of free intraperitoneal air seen. IMPRESSION: Enteric tube is adequately positioned in the stomach but proximal side holes are at the level of the gastroesophageal junction. Recommend advancing 5-10 cm for more optimal radiographic positioning. Electronically Signed   By: Franki Cabot M.D.   On: 09/09/2016 09:34    Assessment:  1 Acute on CKD, probably ESRD 2 Malignant Hypertension 3 Polysubstance abuse 4 VDRF 5 Pulmonary edema due to #2  Plan: 1 I think it will be unlikely that dialysis will be avoidable  2 He is his biggest medical-social problem as his lack of adherence will be detrimental 3 Dialysis as appropriate 4 BP control per CCM 5 High dose furosemide 6 Phosphate binder  Amarianna Abplanalp C 09/09/2016, 4:33 PM

## 2016-09-09 NOTE — ED Notes (Signed)
Spoke with EDP about HTN and patient's LOC. EDP advised to keep titrating nitro and advised no head CT at this time.

## 2016-09-09 NOTE — H&P (Signed)
PULMONARY / CRITICAL CARE MEDICINE   Name: Timothy [name] MRN: [id number] DOB: May 01, 1961    ADMISSION DATE:  09/09/2016 CONSULTATION DATE:  11/23  REFERRING MD:  Tomi Bamberger (EDP)   CHIEF COMPLAINT:  HTN crisis, resp failure   HISTORY OF PRESENT ILLNESS:   55yo male with hx dCHF, colon cancer, uncontrolled HTN, CKD, polysubstance abuse presented 11/25 with SOB, chest pain after taking unknown "pain pill" that he bought off the street.  In ER had HTN with SBP 200's despite nitro gtt, acute on chronic renal failure with Scr 8.5, BNP 1448, metabolic acidosis.  He had waxing and waning mental status and worsening SOB despite bipap and was intubated in ER.  Started on cardene and propofol gtt with slow improvement in BP.  PCCM called to admit.    PAST MEDICAL HISTORY :  He  has a past medical history of Acute diastolic heart failure ([place]) (07/17/2016); BPH (benign prostatic hyperplasia); Colon cancer ([place]) (2014); Elevated troponin; and Hypertension.  PAST SURGICAL HISTORY: He  has a past surgical history that includes Abdominal surgery and Colon surgery (2014).  No Known Allergies  No current facility-administered medications on file prior to encounter.    Current Outpatient Prescriptions on File Prior to Encounter  Medication Sig  . acetaminophen (TYLENOL) 325 MG tablet Take 650 mg by mouth every 6 (six) hours as needed for mild pain.  Marland Kitchen amLODipine (NORVASC) 10 MG tablet Take 1 tablet (10 mg total) by mouth daily.  . cyclobenzaprine (FLEXERIL) 10 MG tablet Take 1 tablet (10 mg total) by mouth 3 (three) times daily as needed for muscle spasms. (Patient not taking: Reported on 07/17/2016)  . folic acid (FOLVITE) 1 MG tablet Take 1 tablet (1 mg total) by mouth daily.  Marland Kitchen HYDROcodone-acetaminophen (NORCO/VICODIN) 5-325 MG tablet Take 1 tablet by mouth every 4 (four) hours as needed for severe pain.  . isosorbide-hydrALAZINE (BIDIL) 20-37.5 MG tablet Take 1 tablet by mouth 2 (two) times daily.  Marland Kitchen  labetalol (NORMODYNE) 200 MG tablet Take 1 tablet (200 mg total) by mouth 2 (two) times daily.  . Multiple Vitamin (MULTIVITAMIN WITH MINERALS) TABS tablet Take 1 tablet by mouth daily.  . pravastatin (PRAVACHOL) 40 MG tablet Take 1 tablet (40 mg total) by mouth daily. (Patient not taking: Reported on 07/17/2016)  . spironolactone (ALDACTONE) 25 MG tablet Take 1 tablet (25 mg total) by mouth 2 (two) times daily.  . tamsulosin (FLOMAX) 0.4 MG CAPS capsule Take 1 capsule (0.4 mg total) by mouth daily. (Patient not taking: Reported on 07/17/2016)  . thiamine 100 MG tablet Take 1 tablet (100 mg total) by mouth daily.    FAMILY HISTORY:  His indicated that his mother is deceased. He indicated that his father is deceased. He indicated that the status of his sister is unknown.    SOCIAL HISTORY: He  reports that he has been smoking Cigarettes.  He has been smoking about 0.25 packs per day. He has never used smokeless tobacco. He reports that he drinks about 4.2 oz of alcohol per week . He reports that he uses drugs, including Marijuana and Heroin, about 1 time per week.  REVIEW OF SYSTEMS:   Unable, pt sedated on vent.  As per HPI obtained from records and RN.   SUBJECTIVE:    VITAL SIGNS: BP 145/89   Pulse 94   Temp 97.3 F (36.3 C) (Oral)   Resp 16   Ht 6\' 2"  (1.88 m)   Wt 73.8 kg (162 lb 11.2  oz)   SpO2 100%   BMI 20.89 kg/m   HEMODYNAMICS:    VENTILATOR SETTINGS: Vent Mode: PRVC FiO2 (%):  [30 %-100 %] 60 % Set Rate:  [16 bmp] 16 bmp Vt Set:  [650 mL] 650 mL PEEP:  [5 cmH20] 5 cmH20 Plateau Pressure:  [45 cmH20] 45 cmH20  INTAKE / OUTPUT: No intake/output data recorded.  PHYSICAL EXAMINATION: General:  Chronically ill appearing male, NAD  Neuro:  Sedated on vent, easily agitated per RN despite propofol, fent gtts  HEENT:  Mm moist, ETT  Cardiovascular:  s1s2 rrr Lungs:  resps even non labored on vent, scattered crackles  Abdomen:  Soft, hypoactive bs  Musculoskeletal:   Warm and dry, no edema   LABS:  BMET  Recent Labs Lab 09/09/16 0509  NA 137  K 3.6  CL 103  CO2 18*  BUN 109*  CREATININE 8.35*  GLUCOSE 89    Electrolytes  Recent Labs Lab 09/09/16 0509  CALCIUM 7.7*    CBC  Recent Labs Lab 09/09/16 0508  WBC 5.4  HGB 10.8*  HCT 31.9*  PLT 272    Coag's No results for input(s): APTT, INR in the last 168 hours.  Sepsis Markers No results for input(s): LATICACIDVEN, PROCALCITON, O2SATVEN in the last 168 hours.  ABG  Recent Labs Lab 09/09/16 0656 09/09/16 0931  PHART 7.371 7.317*  PCO2ART 31.6* 42.5  PO2ART 57.0* 371.0*    Liver Enzymes  Recent Labs Lab 09/09/16 0509  AST 45*  ALT 34  ALKPHOS 63  BILITOT 0.3  ALBUMIN 3.1*    Cardiac Enzymes  Recent Labs Lab 09/09/16 0537  TROPONINI 0.42*    Glucose No results for input(s): GLUCAP in the last 168 hours.  Imaging Dg Chest Portable 1 View  Result Date: 09/09/2016 CLINICAL DATA:  Intubation EXAM: PORTABLE CHEST 1 VIEW COMPARISON:  Chest x-ray from earlier same day and chest x-ray dated 07/19/2016. FINDINGS: Endotracheal tube has been placed with tip located at the level of the clavicles, approximately 7 cm above the carina. Enteric tube passes below the diaphragm although the proximal side holes are at the level of the gastroesophageal junction. There is mild cardiomegaly. The central pulmonary vascular congestion and bilateral pulmonary edema pattern is stable or slightly improved. No pleural effusion or pneumothorax seen. IMPRESSION: 1. Endotracheal tube is adequately positioned with tip at the level of the clavicles, but tip is approximately 7 cm above the carina. Would consider advancing approximately 4-5 cm for more optimal radiographic positioning. 2. Enteric tube passes below the diaphragm, however, proximal side holes are at the level of the gastroesophageal junction. Recommend advancing at least 5 cm for more optimal radiographic positioning. 3.  Cardiomegaly with central pulmonary vascular congestion and bilateral pulmonary edema pattern, consistent with CHF/volume overload, stable or slightly improved compared to the chest x-ray from earlier today. Electronically Signed   By: Franki Cabot M.D.   On: 09/09/2016 09:33   Dg Chest Portable 1 View  Result Date: 09/09/2016 CLINICAL DATA:  Shortness of breath after taking an unknown pain pill. EXAM: PORTABLE CHEST 1 VIEW COMPARISON:  07/19/2016 FINDINGS: Cardiac enlargement with pulmonary vascular congestion and diffuse interstitial edema. Edema pattern is increased since the previous study. No focal consolidation. No blunting of costophrenic angles. No pneumothorax. Calcification of the aorta. IMPRESSION: Cardiac enlargement with pulmonary vascular congestion and increased edema since previous study. Electronically Signed   By: Lucienne Capers M.D.   On: 09/09/2016 06:06   Dg Abd Portable 1  View  Result Date: 09/09/2016 CLINICAL DATA:  OG tube placement. EXAM: PORTABLE ABDOMEN - 1 VIEW COMPARISON:  None. FINDINGS: Enteric tube passes below the diaphragm, however, proximal side holes are at the level of the gastroesophageal junction. Visualized bowel gas pattern is nonobstructive. No evidence of free intraperitoneal air seen. IMPRESSION: Enteric tube is adequately positioned in the stomach but proximal side holes are at the level of the gastroesophageal junction. Recommend advancing 5-10 cm for more optimal radiographic positioning. Electronically Signed   By: Franki Cabot M.D.   On: 09/09/2016 09:34     STUDIES:  CT head 11/25>>> Echo 11/25>>>  CULTURES:   ANTIBIOTICS:   SIGNIFICANT EVENTS:   LINES/TUBES: ETT 11/25>>>  DISCUSSION: 55yo male with hx polysubstance abuse, CKD, uncontrolled HTN, dCHF admitted 11/25 with HTN crisis, acute on chronic renal failure and acute respiratory failure   ASSESSMENT / PLAN:  PULMONARY Acute respiratory failure - r/t AMS and pulmonary edema  in setting HTN crisis and acute on CKD P:   Vent support - 8cc/kg  F/u CXR  F/u ABG Edema improved on f/u CXR   CARDIOVASCULAR HTN crisis - poorly controlled HTN at baseline  Acute on chronic dCHF  Elevated troponin - mild.  Likely demand in setting severe HTN, respiratory distress  P:  cardene gtt  Echo  ekg  Trend troponin q6h   RENAL Acute on CKD - unclear baseline Scr.  Scr 8.5 on admit.  Making some urine after lasix x1 in ER.  Metabolic acidosis  P:   F/u chem, abg this pm and in am  Renal to see  BP control    GASTROINTESTINAL No active issue  P:   NPO  PPI   HEMATOLOGIC No active issue  P:  F/u CBC  SQ heparin   INFECTIOUS No active issue  P:   Monitor wbc, fever curve off abx   ENDOCRINE No active issue    P:   Monitor glucose on chem   NEUROLOGIC AMS - suspect r/t polysubstance abuse and HTN crisis Polysubstance abuse  P:   RASS goal: -1 Propofol, fent gtt CT head pending  Daily WUA    FAMILY  - Updates:  No family available 11/25  - Inter-disciplinary family meet or Palliative Care meeting due by:  12/2   Nickolas Madrid, NP 09/09/2016  9:46 AM Pager: [phone] or [phone]   ATTENDING NOTE / ATTESTATION NOTE :   I have discussed the case with the resident/APP  Nickolas Madrid NP.   I agree with the resident/APP's  history, physical examination, assessment, and plans.    I have edited the above note and modified it according to our agreed history, physical examination, assessment and plan.   Briefly, patient with chronic kidney disease, hypertension, chronic pain, took several pills from several bottles which he bough off the streets for uncontrolled pain. Presented with acute dyspnea and intubated for airway protection. Patient was hypertensive requiring Cardene and nitroglycerin drip. Post intubation blood pressure was relatively better.  Patient seen and examined this morning. Sedated, intubated. Blood pressure  post intubation was 998 systolic. Crackles in both lung fields. Good S1 and S2. Tachycardic. Abdomen was benign.  Labs reviewed. Creatinine 8.21 from baseline of 5.6. BUN 114 from baseline of 59. Potassium 3.9. WBC 5.4. Lactic acid was 0.7. Chest x-ray with cardiomegaly and pulmonary congestion. Drug screen was positive for opiates and marijuana.  Assessment: 1. Hypertensive emergency. Not sure if it's related to the medicines which he  took off the streets. 2. Acute hypoxemic hypercapnic respiratory failure secondary to unable to protect airway, concern for pulmonary edema. 3. Acute kidney injury. Chronic kidney disease. Making urine. 4. Demand ischemia.  Plan : 1. Continue ventilatory support. 2. Continue sedation which will help with her blood pressure control. Precedex may also be used given drug abuse. 3. Continue nicardipine drip if needed for hypertension. Patient was earlier on nitroglycerin drip as well. cont hydralazine when necessary. 4. Diuresis. Renal service is being consulted. Likely will need hemodialysis. 5. Trend troponin. May need heparin drip if troponin rises. 6. Cont other meds.   I have spent 30  minutes of critical care time with this patient today.  Family : No family at bedside.    Monica Becton, MD 09/09/2016, 7:13 PM [facility] Pager [phone] After 3 pm or if no answer, call [phone]

## 2016-09-09 NOTE — ED Notes (Signed)
RN remains at patient beside for 1 on 1 care.

## 2016-09-09 NOTE — ED Notes (Signed)
2x 20mg  bolus given with verbal order from EDP. Pt. Still combative and trying to self extubate. Pt. Being held down by staff for his safety.

## 2016-09-09 NOTE — ED Notes (Signed)
Pt restless and pulling at mask after narcan administration. I explained the need of the mask to the patient. He is still pulling at the mask and requesting for it to come off. Dr. Christy Gentles aware of response to narcan.

## 2016-09-09 NOTE — Progress Notes (Signed)
BP 140/110-54.  Dr. Tennis Must dios notified of BP.  Orders received.  Will continue to monitor closely.

## 2016-09-09 NOTE — ED Notes (Addendum)
Titrating fentanyl faster than recommended with CCM approval.   RN remains in room with 1 on 1 observation of patient since intubation occurred. Patient intermittently agitated at this time. Restraints remain in place.

## 2016-09-09 NOTE — Progress Notes (Signed)
No urine output noted from condom cath in cdu bag.  Bladder scan showing approx. 700 ml urine in bladder.  [employer] notified.  Orders received to place indwelling urinary catheter.  2H nurses (certified to place foley) notified.

## 2016-09-09 NOTE — ED Notes (Signed)
Pt. Transported by RN and RT to CT scanner then to room on [address]. Belongings include various clothing and cellphone. Wife took rest of belongings home with her.

## 2016-09-09 NOTE — Progress Notes (Signed)
BP 184/117-75.  Dr Elsworth Soho notified via [place] [facility].  Will await return of call.  Will continue to monitor closely.

## 2016-09-09 NOTE — ED Provider Notes (Signed)
Patient was seen by Dr. Christy Gentles earlier this morning. Please see his notes.  The admitting team was concerned about the patient's worsening mental status. I was asked to perform endotracheal intubation.  Procedure Name: Intubation Date/Time: 09/09/2016 8:26 AM Performed by: Dorie Rank Pre-anesthesia Checklist: Patient identified, Emergency Drugs available, Suction available, Patient being monitored and Timeout performed Oxygen Delivery Method: Non-rebreather mask Preoxygenation: Pre-oxygenation with 100% oxygen Intubation Type: Rapid sequence Ventilation: Mask ventilation without difficulty Laryngoscope Size: Glidescope Tube size: 8.0 mm Number of attempts: 1 Airway Equipment and Method: Rigid stylet and Video-laryngoscopy Placement Confirmation: ETT inserted through vocal cords under direct vision,  CO2 detector and Breath sounds checked- equal and bilateral Secured at: 23 cm Tube secured with: ETT holder Comments: No complications.  Intubated without difficulty         Dorie Rank, MD 09/09/16 [phone]

## 2016-09-09 NOTE — ED Notes (Signed)
RN advanced OG tube approx 5cm further per radiology recommendations.

## 2016-09-09 NOTE — ED Triage Notes (Signed)
C/o sob that started just after taking unknown "pain pill" that he bought from someone off the street.  States he is unsure name of pill but that it is suppose to be a pain pill and he has taken it before.

## 2016-09-09 NOTE — Consult Note (Signed)
Consultation Note   Timothy [name] URK:[id number] DOB: 07-29-1961 DOA: 09/09/2016   PCP: No PCP on previous admission and was [facility] was reported that both wife and patient initially stated Dr. Vista [name] was his primary care physician  Patient coming from/Resides with: Private residence/lives with wife  Chief Complaint: SOB after taking unknown pill.  HPI: Timothy [name] is a 55 y.o. male with medical history significant for poorly controlled hypertension secondary to nonadherence, polysubstance abuse than in the past has included heroin (inhalation) and progressive chronic kidney disease noting in 2016 renal function was normal. Patient was recently hospitalized and subsequently discharged on 10/7 after admission for chest pain with cough. At that time it was documented that he was extremely poor historian. Prior to that admission he had been prescribed was inappropriately but had not been taking this medication and had [place]. At that presentation he was found to have mild pulmonary edema and new increase serum creatinine from a baseline of 1.59 in 2016 to 4.88 at presentation. At time of discharge his creatinine was 5.61. Echocardiogram was completed that revealed grade 2 diastolic dysfunction with moderate concentric hypertrophy and mild-to-moderate pulmonary hypertension of 35 mmHg. He had a mildly elevated troponin that was felt to be related to demand ischemia. Nephrology was consulted and a follow-up appointment was scheduled with Dr. Moshe [name] on 10/30. He was given information on how to contact the Timothy [name] and Timothy [name].  He presented to the ER last night complaining of shortness of breath that began just after taking an unknown "pain peel" that he bought from him one off the street. Since arrival to the ER his heads difficult to  control blood pressure with average range of blood pressure 220s/160s. Chest x-ray revealed cardiac enlargement with pulmonary vascular congestion and increased edema since previous x-ray on 10/4. Lab data revealed worsened renal function with creatinine increasing from 5.61 to 8.35. Patient's BUN had also increased from 59 to109. BNP was markedly elevated at 3212 with troponin elevated at 0.42. Patient was placed on BiPAP. He developed lethargy that responded briefly to high-dose Narcan but then recurred. An ABG was performed with normal pH, PCO2 of 32 and a PO2 of 57 with an acid base of 6 and a bicarbonate of 18.5. He did not have leukocytosis. Tylenol and salicylate levels were normal. Glucose was 89. Urine drug screen was positive for opiates and THC, alcohol level was less than 5. He was started on IV nitroglycerin per the EDP and at time of requests for admission to the hospitalist service see was 75 mcg/m with a blood pressure reading of 210/163 heart rate 92.  Upon my arrival to the room patient was quite somnolent and difficult to arouse. After a coughing episode he did awaken and made some eye contact and briefly in weakly squeeze my right hand but was nonverbal. He appeared to be quite encephalopathic. His blood pressure remained markedly elevated as described above. On exam he had bilateral wheezing and coarse lung sounds. He appeared to be unable to protect his airway. Nursing staff was notified and an order placed for nicardipine infusion and DC IV nitroglycerin. Stat CT of the head has been ordered. PCCM/Dr. Corrie [name] was consulted and agreed patient need to be intubated. EDP/Dr. Tomi [name] was made aware and he agreed to intubate the patient prior to sending the patient to CT scan.  ED Course:  Vital Signs: BP (!) 255/179   Pulse 78   Temp 97.3 F (36.3 C) (Oral)   Resp 24   Wt 73.8 kg (162 lb 11.2 oz)   SpO2 96%   BMI 21.47 kg/m  (See HPI)  Review of Systems:  **Unable to obtain from  patient due to encephalopathic state   Past Medical History:  Diagnosis Date  . Acute diastolic heart failure ([place]) 07/17/2016  . BPH (benign prostatic hyperplasia)   . Colon cancer ([place]) 2014  . Elevated troponin   . Hypertension     Past Surgical History:  Procedure Laterality Date  . ABDOMINAL SURGERY    . COLON SURGERY  2014    Social History   Social History  . Marital status: Married    Spouse name: N/A  . Number of children: N/A  . Years of education: N/A   Occupational History  . Not on file.   Social History Main Topics  . Smoking status: Current Every Day Smoker    Packs/day: 0.25    Types: Cigarettes  . Smokeless tobacco: Never Used  . Alcohol use 4.2 oz/week    7 Cans of beer per week     Comment: drinks daily 1-2 beers  . Drug use:     Frequency: 1.0 time per week    Types: Marijuana, Heroin     Comment: snorts heroin, takes pain pills that he buys off the street  . Sexual activity: Not on file     Comment: once a week   Other Topics Concern  . Not on file   Social History Narrative  . No narrative on file    Mobility: At time of discharge 10/7 was not requiring assistive devices Work history: Appears to be disabled/unemployed   No Known Allergies  Family History  Problem Relation Age of Onset  . Heart disease Mother     Died at age 17.  Marland Kitchen Heart failure Mother   . Kidney failure Sister     Prior to Admission medications   Medication Sig Start Date End Date Taking? Authorizing Provider  acetaminophen (TYLENOL) 325 MG tablet Take 650 mg by mouth every 6 (six) hours as needed for mild pain.    Historical Provider, MD  amLODipine (NORVASC) 10 MG tablet Take 1 tablet (10 mg total) by mouth daily. 07/22/16   Silver Huguenin Elgergawy, MD  cyclobenzaprine (FLEXERIL) 10 MG tablet Take 1 tablet (10 mg total) by mouth 3 (three) times daily as needed for muscle spasms. Patient not taking: Reported on 07/17/2016 07/13/15   Melony Overly, MD  folic acid  (FOLVITE) 1 MG tablet Take 1 tablet (1 mg total) by mouth daily. 07/23/16   Silver Huguenin Elgergawy, MD  HYDROcodone-acetaminophen (NORCO/VICODIN) 5-325 MG tablet Take 1 tablet by mouth every 4 (four) hours as needed for severe pain. 07/22/16   Silver Huguenin Elgergawy, MD  isosorbide-hydrALAZINE (BIDIL) 20-37.5 MG tablet Take 1 tablet by mouth 2 (two) times daily. 07/22/16   Silver Huguenin Elgergawy, MD  labetalol (NORMODYNE) 200 MG tablet Take 1 tablet (200 mg total) by mouth 2 (two) times daily. 07/22/16   Silver Huguenin Elgergawy, MD  Multiple Vitamin (MULTIVITAMIN WITH MINERALS) TABS tablet Take 1 tablet by mouth daily. 07/23/16   Silver Huguenin Elgergawy, MD  pravastatin (PRAVACHOL) 40 MG tablet Take 1 tablet (40 mg total) by mouth daily. Patient not taking: Reported on 07/17/2016 09/13/15   Micheline Chapman, NP  spironolactone (ALDACTONE) 25 MG tablet  Take 1 tablet (25 mg total) by mouth 2 (two) times daily. 07/22/16   Silver Huguenin Elgergawy, MD  tamsulosin (FLOMAX) 0.4 MG CAPS capsule Take 1 capsule (0.4 mg total) by mouth daily. Patient not taking: Reported on 07/17/2016 09/13/15   Micheline Chapman, NP  thiamine 100 MG tablet Take 1 tablet (100 mg total) by mouth daily. 07/23/16   Albertine Patricia, MD    Physical Exam: Vitals:   09/09/16 0745 09/09/16 0800 09/09/16 0815 09/09/16 0820  BP: (!) 206/163 (!) 210/163 (!) 193/136 (!) 255/179  Pulse: 93 92 91 78  Resp: 22 25 18 24   Temp:      TempSrc:      SpO2: 99% 100% 99% 96%  Weight:  73.8 kg (162 lb 11.2 oz)        Constitutional: Somnolent and difficult to arouse, appears critically ill Eyes: PERRL-pupils dilated 4 mm, lids and conjunctivae normal ENMT: Mucous membranes are moist. Posterior pharynx clear of any exudate or lesions.Normal dentition.  Neck: normal, supple, no masses, no thyromegaly Respiratory: Coarse to auscultation with scattered wheezes, increased work of breathing with underlying tachypnea, BiPAP in place with 30% FiO2 Cardiovascular: Sinus  rhythm, no murmurs / rubs / gallops. No extremity edema. 2+ pedal pulses. No carotid bruits.  Abdomen: no tenderness, no masses palpated. No hepatosplenomegaly. Bowel sounds positive.  Musculoskeletal: no clubbing / cyanosis. No joint deformity upper and lower extremities. Good ROM, no contractures. Normal muscle tone.  Skin: no rashes, lesions, ulcers. No induration Neurologic: CN 2-12 appears to be grossly intact but exam limited by patient's inability to participate and placement of BiPAP mask. Sensation hears to be grossly intact, DTR critical. Unable to test strength Psychiatric: A and is somnolent and quite difficult to arouse. He briefly awakened and made eye contact and squeezed very weakly with right hand but was nonverbal   Labs on Admission: I have personally reviewed following labs and imaging studies  CBC:  Recent Labs Lab 09/09/16 0508  WBC 5.4  HGB 10.8*  HCT 31.9*  MCV 83.7  PLT 209   Basic Metabolic Panel:  Recent Labs Lab 09/09/16 0509  NA 137  K 3.6  CL 103  CO2 18*  GLUCOSE 89  BUN 109*  CREATININE 8.35*  CALCIUM 7.7*   GFR: Estimated Creatinine Clearance: 10.4 mL/min (by C-G formula based on SCr of 8.35 mg/dL (H)). Liver Function Tests:  Recent Labs Lab 09/09/16 0509  AST 45*  ALT 34  ALKPHOS 63  BILITOT 0.3  PROT 6.2*  ALBUMIN 3.1*   No results for input(s): LIPASE, AMYLASE in the last 168 hours. No results for input(s): AMMONIA in the last 168 hours. Coagulation Profile: No results for input(s): INR, PROTIME in the last 168 hours. Cardiac Enzymes:  Recent Labs Lab 09/09/16 0537  TROPONINI 0.42*   BNP (last 3 results) No results for input(s): PROBNP in the last 8760 hours. HbA1C: No results for input(s): HGBA1C in the last 72 hours. CBG: No results for input(s): GLUCAP in the last 168 hours. Lipid Profile: No results for input(s): CHOL, HDL, LDLCALC, TRIG, CHOLHDL, LDLDIRECT in the last 72 hours. Thyroid Function Tests: No  results for input(s): TSH, T4TOTAL, FREET4, T3FREE, THYROIDAB in the last 72 hours. Anemia Panel: No results for input(s): VITAMINB12, FOLATE, FERRITIN, TIBC, IRON, RETICCTPCT in the last 72 hours. Urine analysis:    Component Value Date/Time   COLORURINE YELLOW 07/19/2016 2027   APPEARANCEUR CLEAR 07/19/2016 2027   LABSPEC 1.013 07/19/2016 2027  PHURINE 5.5 07/19/2016 2027   GLUCOSEU NEGATIVE 07/19/2016 2027   HGBUR NEGATIVE 07/19/2016 2027   BILIRUBINUR NEGATIVE 07/19/2016 2027   KETONESUR NEGATIVE 07/19/2016 2027   PROTEINUR 100 (A) 07/19/2016 2027   NITRITE NEGATIVE 07/19/2016 2027   LEUKOCYTESUR TRACE (A) 07/19/2016 2027   Sepsis Labs: @LABRCNTIP (procalcitonin:4,lacticidven:4) )No results found for this or any previous visit (from the past 240 hour(s)).   Radiological Exams on Admission: Dg Chest Portable 1 View  Result Date: 09/09/2016 CLINICAL DATA:  Shortness of breath after taking an unknown pain pill. EXAM: PORTABLE CHEST 1 VIEW COMPARISON:  07/19/2016 FINDINGS: Cardiac enlargement with pulmonary vascular congestion and diffuse interstitial edema. Edema pattern is increased since the previous study. No focal consolidation. No blunting of costophrenic angles. No pneumothorax. Calcification of the aorta. IMPRESSION: Cardiac enlargement with pulmonary vascular congestion and increased edema since previous study. Electronically Signed   By: Lucienne Capers M.D.   On: 09/09/2016 06:06    EKG: (Independently reviewed) sinus rhythm with ventricular rate 92 bpm, QTC 494 ms, is attentive voltage criteria for LVH and unchanged from previous EKG  Assessment/Plan Principal Problem:   Hypertensive emergency -Failed IV nitroglycerin -Begin nicardipine infusion and transfer to ICU -Given underlying encephalopathy obtain stat CT of head without contrast  Active Problems:  Acute respiratory failure with hypoxia 2/2 Acute diastolic heart failure, NYHA class 2/Acute pulmonary edema    - Too encephalopathic to tolerate BiPAP so intubated to protect airway by EDP -Minimal urine output noted in urinal after given 40 mg of IV Lasix -Suspect will need either higher dose of Lasix with improved blood pressure control versus acute dialysis to remove volume overload/tx heart failure    Acute metabolic encephalopathy -Likely multifactorial related to recent unknown medications presumed to be narcotic (responded briefly to Narcan, hypoxemia, uremia, and possible PRES -Follow up on stat CT -ABG without hypercarbia    Acute renal failure superimposed on stage 5 chronic kidney disease, not on chronic dialysis  -Baseline renal function 2016: 19/1.59 -Renal function at discharge on 10/7: 59/5.61 -Current renal function: 109/8 0.35 -Nephrology consulted -May require urgent dialysis this admission    Heroin abuse/Polysubstance abuse -Patient too lethargic to provide adequate history regarding current use of hair when and when last used -Urine drug screen positive for opiates and THC -Monitor for withdrawal symptoms -Psych consult indicated if needs methadone for heroin withdrawal treatment    Nonadherence to medical treatment -Unable to address at this juncture due to patient's metabolic encephalopathy and inability to participate with history    Anemia due to chronic kidney disease -Hemoglobin at baseline 10.8       Haifa Hatton L. ANP-BC [facility] Pager [phone]   If 7PM-7AM, please contact night-coverage [email or web address] Password [facility]  09/09/2016, 8:32 AM

## 2016-09-09 NOTE — Progress Notes (Signed)
Initial Nutrition Assessment  DOCUMENTATION CODES:   Not applicable  INTERVENTION:  - If pt to remain intubated >/= 24 hours, recommend Nepro @ 30 mL/hr with 30 mL Prostat BID. This regimen + kcal from current Propofol rate will provide 1842 kcal (105% estimated kcal need), 88 grams of protein, and 523 mL free water.  - RD will follow-up 11/27.  NUTRITION DIAGNOSIS:   Inadequate oral intake related to inability to eat as evidenced by NPO status.  GOAL:   Patient will meet greater than or equal to 90% of their needs  MONITOR:   Vent status, Weight trends, Labs, I & O's  REASON FOR ASSESSMENT:   Ventilator  ASSESSMENT:   55yo male with hx dCHF, colon cancer, uncontrolled HTN, CKD, polysubstance abuse presented 11/25 with SOB, chest pain after taking unknown "pain pill" that he bought off the street.  In ER had HTN with SBP 200's despite nitro gtt, acute on chronic renal failure with Scr 8.5, BNP 2924, metabolic acidosis.  He had waxing and waning mental status and worsening SOB despite bipap and was intubated in ER.  Started on cardene and propofol gtt with slow improvement in BP.   Pt seen for new vent. BMI indicates normal weight. OGT in place. No family/visitors present to provide PTA information at this time. RT working with pt; unable to complete physical assessment but will attempt at follow-up. No recent weight hx in the chart for comparison to CBW. No notes indicating plan for HD or CRRT at this time; will monitor and adjust TF recommendations as needed.   Patient is currently intubated on ventilator support MV: 10.9 L/min Temp (24hrs), Avg:96.7 F (35.9 C), Min:96 F (35.6 C), Max:97.3 F (36.3 C) Propofol: 13.1 ml/hr (346 kcal).  Medications reviewed; Labs reviewed; CBGs: 80 and 93 mg/dL today, BUN: 114 mg/dL, creatinine: 8.21 mg/dL, Ca: 7.5 mg/dL, GFR: 8 mL/min, Phos: 7.3 mg/dL, K and Mg WDL at this time.   Drips: Fentanyl @ 400 mcg/hr, Propofol @ 30  mcg/kg/min.   Diet Order:  Diet NPO time specified  Skin:  Reviewed, no issues  Last BM:  PTA/unknown  Height:   Ht Readings from Last 1 Encounters:  09/09/16 6\' 2"  (1.88 m)    Weight:   Wt Readings from Last 1 Encounters:  09/09/16 162 lb 11.2 oz (73.8 kg)    Ideal Body Weight:  86.36 kg  BMI:  Body mass index is 20.89 kg/m.  Estimated Nutritional Needs:   Kcal:  4628  Protein:  86-111 grams (1.2-1.5 grams/kg)  Fluid:  per MD given progressive kidney failure  EDUCATION NEEDS:   No education needs identified at this time    Jarome Matin, MS, RD, LDN, CNSC Inpatient Clinical Dietitian Pager # [phone] After hours/weekend pager # [phone]

## 2016-09-09 NOTE — Progress Notes (Signed)
Dr. Corrie Dandy notified of [phone].  Metoprolol 5mg  IV given per order.  Will continue to monitor closely.

## 2016-09-09 NOTE — ED Notes (Signed)
Pt. Combative and attempted to self extubate. 5x staff holding patient down for safety. EDP ordering sedation medications at this time.

## 2016-09-09 NOTE — ED Notes (Signed)
Pt more lethargic and difficult to arouse. Dr. Christy Gentles at bedside

## 2016-09-09 NOTE — Procedures (Signed)
Intubation Procedure Note Mohammad Granade [id number] 1961-02-06  Procedure: Intubation Indications: Airway protection and maintenance  Procedure Details Consent: Risks of procedure as well as the alternatives and risks of each were explained to the (patient/caregiver).  Consent for procedure obtained. Time Out: Verified patient identification, verified procedure, site/side was marked, verified correct patient position, special equipment/implants available, medications/allergies/relevent history reviewed, required imaging and test results available.  Performed  Maximum sterile technique was used including gloves.  MAC and 4    Evaluation Hemodynamic Status: Transient hypertension requiring treatment; O2 sats: stable throughout Patient's Current Condition: stable Complications: No apparent complications Patient did tolerate procedure well. Chest X-ray ordered to verify placement.  CXR: pending.   Elsie Stain 09/09/2016

## 2016-09-09 NOTE — Progress Notes (Signed)
eLink Physician-Brief Progress Note Patient Name: Timothy [name] DOB: 10/28/1960 MRN: [id number]   Date of Service  09/09/2016  HPI/Events of Note  htn crisis stg 4 CKD  eICU Interventions  Clarified MAP goal 100-110 Increased hydrallazine 10-40 prn     Intervention Category Major Interventions: Hypertension - evaluation and management  [name] V. 09/09/2016, 4:55 PM

## 2016-09-10 ENCOUNTER — Inpatient Hospital Stay (HOSPITAL_COMMUNITY): Payer: Medicaid Other

## 2016-09-10 DIAGNOSIS — I5031 Acute diastolic (congestive) heart failure: Secondary | ICD-10-CM

## 2016-09-10 LAB — HEPATIC FUNCTION PANEL
ALBUMIN: 2.8 g/dL — AB (ref 3.5–5.0)
ALK PHOS: 59 U/L (ref 38–126)
ALT: 30 U/L (ref 17–63)
AST: 34 U/L (ref 15–41)
Bilirubin, Direct: 0.2 mg/dL (ref 0.1–0.5)
Indirect Bilirubin: 0.1 mg/dL — ABNORMAL LOW (ref 0.3–0.9)
TOTAL PROTEIN: 5.8 g/dL — AB (ref 6.5–8.1)
Total Bilirubin: 0.3 mg/dL (ref 0.3–1.2)

## 2016-09-10 LAB — GLUCOSE, CAPILLARY
GLUCOSE-CAPILLARY: 85 mg/dL (ref 65–99)
GLUCOSE-CAPILLARY: 79 mg/dL (ref 65–99)
Glucose-Capillary: 73 mg/dL (ref 65–99)
Glucose-Capillary: 79 mg/dL (ref 65–99)
Glucose-Capillary: 80 mg/dL (ref 65–99)
Glucose-Capillary: 87 mg/dL (ref 65–99)

## 2016-09-10 LAB — CBC
HEMATOCRIT: 30.1 % — AB (ref 39.0–52.0)
Hemoglobin: 10.3 g/dL — ABNORMAL LOW (ref 13.0–17.0)
MCH: 28.5 pg (ref 26.0–34.0)
MCHC: 34.2 g/dL (ref 30.0–36.0)
MCV: 83.1 fL (ref 78.0–100.0)
Platelets: 276 10*3/uL (ref 150–400)
RBC: 3.62 MIL/uL — AB (ref 4.22–5.81)
RDW: 13.2 % (ref 11.5–15.5)
WBC: 7.5 10*3/uL (ref 4.0–10.5)

## 2016-09-10 LAB — BASIC METABOLIC PANEL
ANION GAP: 16 — AB (ref 5–15)
BUN: 113 mg/dL — AB (ref 6–20)
CHLORIDE: 105 mmol/L (ref 101–111)
CO2: 19 mmol/L — AB (ref 22–32)
Calcium: 7.9 mg/dL — ABNORMAL LOW (ref 8.9–10.3)
Creatinine, Ser: 8.29 mg/dL — ABNORMAL HIGH (ref 0.61–1.24)
GFR calc Af Amer: 7 mL/min — ABNORMAL LOW (ref >60)
GFR, EST NON AFRICAN AMERICAN: 6 mL/min — AB (ref >60)
GLUCOSE: 81 mg/dL (ref 65–99)
POTASSIUM: 3.4 mmol/L — AB (ref 3.5–5.1)
Sodium: 140 mmol/L (ref 135–145)

## 2016-09-10 LAB — MAGNESIUM: Magnesium: 2.1 mg/dL (ref 1.7–2.4)

## 2016-09-10 LAB — PHOSPHORUS: Phosphorus: 6.9 mg/dL — ABNORMAL HIGH (ref 2.5–4.6)

## 2016-09-10 LAB — HIV ANTIBODY (ROUTINE TESTING W REFLEX): HIV SCREEN 4TH GENERATION: NONREACTIVE

## 2016-09-10 LAB — POTASSIUM: POTASSIUM: 3.7 mmol/L (ref 3.5–5.1)

## 2016-09-10 MED ORDER — NEPRO/CARBSTEADY PO LIQD
1000.0000 mL | ORAL | Status: DC
  Administered 2016-09-10 – 2016-09-11 (×2): 1000 mL
  Filled 2016-09-10 (×4): qty 1000

## 2016-09-10 MED ORDER — POTASSIUM CHLORIDE 20 MEQ/15ML (10%) PO SOLN
40.0000 meq | Freq: Once | ORAL | Status: AC
  Administered 2016-09-10: 40 meq via ORAL
  Filled 2016-09-10: qty 30

## 2016-09-10 MED ORDER — PRO-STAT SUGAR FREE PO LIQD
30.0000 mL | Freq: Two times a day (BID) | ORAL | Status: DC
  Administered 2016-09-10 – 2016-09-11 (×3): 30 mL via ORAL
  Filled 2016-09-10 (×3): qty 30

## 2016-09-10 NOTE — Progress Notes (Signed)
PULMONARY / CRITICAL CARE MEDICINE   Name: Timothy [name] MRN: [id number] DOB: 08/11/1961    ADMISSION DATE:  09/09/2016 CONSULTATION DATE:  11/23  REFERRING MD:  Tomi Bamberger (EDP)   CHIEF COMPLAINT:  HTN crisis, resp failure   HISTORY OF PRESENT ILLNESS:   55yo male with hx dCHF, colon cancer, uncontrolled HTN, CKD, polysubstance abuse presented 11/25 with SOB, chest pain after taking unknown "pain pill" that he bought off the street.  In ER had HTN with SBP 200's despite nitro gtt, acute on chronic renal failure with Scr 8.5, BNP 9702, metabolic acidosis.  He had waxing and waning mental status and worsening SOB despite bipap and was intubated in ER.  Started on cardene and propofol gtt with slow improvement in BP.  PCCM called to admit.    PAST MEDICAL HISTORY :  He  has a past medical history of Acute diastolic heart failure ([place]) (07/17/2016); BPH (benign prostatic hyperplasia); Colon cancer ([place]) (2014); Elevated troponin; and Hypertension.  PAST SURGICAL HISTORY: He  has a past surgical history that includes Abdominal surgery and Colon surgery (2014).  No Known Allergies  No current facility-administered medications on file prior to encounter.    Current Outpatient Prescriptions on File Prior to Encounter  Medication Sig  . acetaminophen (TYLENOL) 325 MG tablet Take 650 mg by mouth every 6 (six) hours as needed for mild pain.  Marland Kitchen amLODipine (NORVASC) 10 MG tablet Take 1 tablet (10 mg total) by mouth daily.  . cyclobenzaprine (FLEXERIL) 10 MG tablet Take 1 tablet (10 mg total) by mouth 3 (three) times daily as needed for muscle spasms. (Patient not taking: Reported on 07/17/2016)  . folic acid (FOLVITE) 1 MG tablet Take 1 tablet (1 mg total) by mouth daily.  Marland Kitchen HYDROcodone-acetaminophen (NORCO/VICODIN) 5-325 MG tablet Take 1 tablet by mouth every 4 (four) hours as needed for severe pain.  . isosorbide-hydrALAZINE (BIDIL) 20-37.5 MG tablet Take 1 tablet by mouth 2 (two) times daily.  Marland Kitchen  labetalol (NORMODYNE) 200 MG tablet Take 1 tablet (200 mg total) by mouth 2 (two) times daily.  . Multiple Vitamin (MULTIVITAMIN WITH MINERALS) TABS tablet Take 1 tablet by mouth daily.  . pravastatin (PRAVACHOL) 40 MG tablet Take 1 tablet (40 mg total) by mouth daily. (Patient not taking: Reported on 07/17/2016)  . spironolactone (ALDACTONE) 25 MG tablet Take 1 tablet (25 mg total) by mouth 2 (two) times daily.  . tamsulosin (FLOMAX) 0.4 MG CAPS capsule Take 1 capsule (0.4 mg total) by mouth daily. (Patient not taking: Reported on 07/17/2016)  . thiamine 100 MG tablet Take 1 tablet (100 mg total) by mouth daily.    FAMILY HISTORY:  His indicated that his mother is deceased. He indicated that his father is deceased. He indicated that the status of his sister is unknown.    SOCIAL HISTORY: He  reports that he has been smoking Cigarettes.  He has been smoking about 0.25 packs per day. He has never used smokeless tobacco. He reports that he drinks about 4.2 oz of alcohol per week . He reports that he uses drugs, including Marijuana and Heroin, about 1 time per week.  REVIEW OF SYSTEMS:   Unable, pt sedated on vent.  As per HPI obtained from records and RN.   SUBJECTIVE:  BP better controlled. Making urine  Sedated.   VITAL SIGNS: BP 126/76   Pulse 64   Temp 97.3 F (36.3 C) (Oral)   Resp 16   Ht 6\' 2"  (1.88 m)  Wt 69.1 kg (152 lb 5.4 oz)   SpO2 99%   BMI 19.56 kg/m   HEMODYNAMICS:    VENTILATOR SETTINGS: Vent Mode: PRVC FiO2 (%):  [40 %-50 %] 40 % Set Rate:  [16 bmp] 16 bmp Vt Set:  [650 mL] 650 mL PEEP:  [5 cmH20] 5 cmH20 Plateau Pressure:  [19 cmH20-22 cmH20] 20 cmH20  INTAKE / OUTPUT: I/O last 3 completed shifts: In: 2295.6 [I.V.:1862.6; Other:95; NG/GT:90; IV Piggyback:248] Out: 2887 [Urine:2887]  PHYSICAL EXAMINATION: General:  Chronically ill appearing male, NAD  Neuro:  Sedated on vent, easily agitated per RN despite propofol, fent gtts  HEENT:  Mm moist, ETT   Cardiovascular:  s1s2 rrr Lungs:  resps even non labored on vent, scattered crackles  Abdomen:  Soft, hypoactive bs  Musculoskeletal:  Warm and dry, Gr 1 edema  LABS:  BMET  Recent Labs Lab 09/09/16 0509 09/09/16 1238 09/10/16 0253  NA 137 139 140  K 3.6 3.9 3.4*  CL 103 107 105  CO2 18* 18* 19*  BUN 109* 114* 113*  CREATININE 8.35* 8.21* 8.29*  GLUCOSE 89 84 81    Electrolytes  Recent Labs Lab 09/09/16 0509 09/09/16 0943 09/09/16 1238 09/10/16 0253  CALCIUM 7.7*  --  7.5* 7.9*  MG  --  2.3  --  2.1  PHOS  --  7.3*  --  6.9*    CBC  Recent Labs Lab 09/09/16 0508 09/10/16 0253  WBC 5.4 7.5  HGB 10.8* 10.3*  HCT 31.9* 30.1*  PLT 272 276    Coag's No results for input(s): APTT, INR in the last 168 hours.  Sepsis Markers  Recent Labs Lab 09/09/16 0943 09/09/16 1238  LATICACIDVEN 0.9 0.7    ABG  Recent Labs Lab 09/09/16 0656 09/09/16 0931  PHART 7.371 7.317*  PCO2ART 31.6* 42.5  PO2ART 57.0* 371.0*    Liver Enzymes  Recent Labs Lab 09/09/16 0509 09/10/16 0253  AST 45* 34  ALT 34 30  ALKPHOS 63 59  BILITOT 0.3 0.3  ALBUMIN 3.1* 2.8*    Cardiac Enzymes  Recent Labs Lab 09/09/16 0943 09/09/16 1439 09/09/16 2123  TROPONINI 0.44* 0.41* 0.33*    Glucose  Recent Labs Lab 09/09/16 1538 09/09/16 1928 09/09/16 2332 09/10/16 0402 09/10/16 0812 09/10/16 1158  GLUCAP 80 98 102* 85 79 87    Imaging Dg Chest Port 1 View  Result Date: 09/10/2016 CLINICAL DATA:  Respiratory failure, check endotracheal tube EXAM: PORTABLE CHEST 1 VIEW COMPARISON:  09/09/2016 FINDINGS: Cardiac shadow is stable. Endotracheal tube and nasogastric catheter are again noted in satisfactory position. Increased density is noted in the left retrocardiac region consistent with consolidation. No sizable effusion is seen. No pneumothorax is noted. IMPRESSION: Increasing left retrocardiac consolidation. Electronically Signed   By: Inez Catalina M.D.   On:  09/10/2016 07:08     STUDIES:  CT head 11/25>>> Echo 11/25>>>  CULTURES:   ANTIBIOTICS:   SIGNIFICANT EVENTS:   LINES/TUBES: ETT 11/25>>>  DISCUSSION: 55yo male with hx polysubstance abuse, CKD, uncontrolled HTN, dCHF admitted 11/25 with HTN crisis, acute on chronic renal failure and acute respiratory failure   ASSESSMENT / PLAN:  PULMONARY Acute respiratory failure - r/t AMS and pulmonary edema in setting HTN crisis and acute on CKD P:   Vent support - 8cc/kg  F/u CXR  F/u ABG Edema improved on f/u CXR   CARDIOVASCULAR HTN crisis - poorly controlled HTN at baseline  Acute on chronic dCHF  Elevated troponin - mild.  Likely demand in setting severe HTN, respiratory distress  P:  cardene gtt > weaning off Echo  ekg   RENAL Acute on CKD - unclear baseline Scr.  Scr 8.5 on admit.  Making some urine after lasix x1 in ER.  Metabolic acidosis  P:   F/u chem, abg this pm and in am  BP control  Lasix per renal. Need to ask renal if pt will need perm cath sooner rather than later.    GASTROINTESTINAL No active issue  P:   Start TF PPI   HEMATOLOGIC No active issue  P:  F/u CBC  SQ heparin   INFECTIOUS No active issue  P:   Monitor wbc, fever curve off abx   ENDOCRINE No active issue    P:   Monitor glucose on chem   NEUROLOGIC AMS - suspect r/t polysubstance abuse and HTN crisis Polysubstance abuse  P:   RASS goal: -1 Propofol, fent gtt CT head pending  Daily WUA    FAMILY  - Updates:  No family available 11/26  - Inter-disciplinary family meet or Palliative Care meeting due by:  12/2   I have spent 30  minutes of critical care time with this patient today.  Monica Becton, MD 09/10/2016, 3:46 PM [facility] Pager [phone] After 3 pm or if no answer, call [phone]

## 2016-09-10 NOTE — Progress Notes (Signed)
Nepro initiated at 20 ml per hour to ogt.  Progression to 30 ml per hour due at 2045.

## 2016-09-11 ENCOUNTER — Inpatient Hospital Stay (HOSPITAL_COMMUNITY): Payer: Medicaid Other

## 2016-09-11 DIAGNOSIS — I509 Heart failure, unspecified: Secondary | ICD-10-CM

## 2016-09-11 DIAGNOSIS — N185 Chronic kidney disease, stage 5: Secondary | ICD-10-CM

## 2016-09-11 DIAGNOSIS — N179 Acute kidney failure, unspecified: Principal | ICD-10-CM

## 2016-09-11 DIAGNOSIS — J9601 Acute respiratory failure with hypoxia: Secondary | ICD-10-CM

## 2016-09-11 LAB — CBC
HEMATOCRIT: 31.2 % — AB (ref 39.0–52.0)
Hemoglobin: 10.9 g/dL — ABNORMAL LOW (ref 13.0–17.0)
MCH: 29.1 pg (ref 26.0–34.0)
MCHC: 34.9 g/dL (ref 30.0–36.0)
MCV: 83.4 fL (ref 78.0–100.0)
Platelets: 275 10*3/uL (ref 150–400)
RBC: 3.74 MIL/uL — AB (ref 4.22–5.81)
RDW: 13.5 % (ref 11.5–15.5)
WBC: 7.9 10*3/uL (ref 4.0–10.5)

## 2016-09-11 LAB — GLUCOSE, CAPILLARY
GLUCOSE-CAPILLARY: 89 mg/dL (ref 65–99)
GLUCOSE-CAPILLARY: 82 mg/dL (ref 65–99)
Glucose-Capillary: 84 mg/dL (ref 65–99)
Glucose-Capillary: 86 mg/dL (ref 65–99)
Glucose-Capillary: 102 mg/dL — ABNORMAL HIGH (ref 65–99)
Glucose-Capillary: 104 mg/dL — ABNORMAL HIGH (ref 65–99)

## 2016-09-11 LAB — BASIC METABOLIC PANEL
ANION GAP: 13 (ref 5–15)
BUN: 115 mg/dL — ABNORMAL HIGH (ref 6–20)
CO2: 21 mmol/L — AB (ref 22–32)
Calcium: 8.1 mg/dL — ABNORMAL LOW (ref 8.9–10.3)
Chloride: 105 mmol/L (ref 101–111)
Creatinine, Ser: 8.44 mg/dL — ABNORMAL HIGH (ref 0.61–1.24)
GFR calc Af Amer: 7 mL/min — ABNORMAL LOW (ref >60)
GFR calc non Af Amer: 6 mL/min — ABNORMAL LOW (ref >60)
GLUCOSE: 93 mg/dL (ref 65–99)
POTASSIUM: 3.9 mmol/L (ref 3.5–5.1)
Sodium: 139 mmol/L (ref 135–145)

## 2016-09-11 LAB — POTASSIUM
POTASSIUM: 3.6 mmol/L (ref 3.5–5.1)
POTASSIUM: 3.5 mmol/L (ref 3.5–5.1)
Potassium: 4 mmol/L (ref 3.5–5.1)

## 2016-09-11 LAB — PHOSPHORUS: Phosphorus: 8.4 mg/dL — ABNORMAL HIGH (ref 2.5–4.6)

## 2016-09-11 LAB — MAGNESIUM: Magnesium: 2.2 mg/dL (ref 1.7–2.4)

## 2016-09-11 LAB — ECHOCARDIOGRAM COMPLETE
HEIGHTINCHES: 74 in
Weight: 2472.68 oz

## 2016-09-11 MED ORDER — NICARDIPINE HCL IN NACL 40-0.83 MG/200ML-% IV SOLN
3.0000 mg/h | INTRAVENOUS | Status: DC
  Administered 2016-09-11: 3 mg/h via INTRAVENOUS
  Filled 2016-09-11 (×2): qty 200

## 2016-09-11 NOTE — Progress Notes (Signed)
PULMONARY / CRITICAL CARE MEDICINE   Name: Timothy [name] MRN: [id number] DOB: October 22, 1960    ADMISSION DATE:  09/09/2016 CONSULTATION DATE:  11/23  REFERRING MD:  Tomi Bamberger (EDP)   CHIEF COMPLAINT:  HTN crisis, resp failure   HISTORY OF PRESENT ILLNESS:   55yo male with hx dCHF, colon cancer, uncontrolled HTN, CKD, polysubstance abuse presented 11/25 with SOB, chest pain after taking unknown "pain pill" that he bought off the street.  In ER had HTN with SBP 200's despite nitro gtt, acute on chronic renal failure with Scr 8.5, BNP 4098, metabolic acidosis.  He had waxing and waning mental status and worsening SOB despite bipap and was intubated in ER.  Started on cardene and propofol gtt with slow improvement in BP.  PCCM called to admit.     SUBJECTIVE:  Wife reports she expects the patient to wake up agitated off sedation.  She is concerned he has been using drugs again (specifically heroin)  VITAL SIGNS: BP (!) 144/80   Pulse 91   Temp 100.3 F (37.9 C) (Oral)   Resp 16   Ht 6\' 2"  (1.88 m)   Wt 154 lb 8.7 oz (70.1 kg)   SpO2 97%   BMI 19.84 kg/m   HEMODYNAMICS:    VENTILATOR SETTINGS: Vent Mode: PRVC FiO2 (%):  [40 %] 40 % Set Rate:  [16 bmp] 16 bmp Vt Set:  [650 mL] 650 mL PEEP:  [5 cmH20] 5 cmH20 Plateau Pressure:  [19 cmH20-21 cmH20] 21 cmH20  INTAKE / OUTPUT: I/O last 3 completed shifts: In: 1191 [P.O.:150; I.V.:3536; NG/GT:759; IV Piggyback:471] Out: 5480 [Urine:5480]  PHYSICAL EXAMINATION: General:  Chronically ill appearing male, NAD  Neuro:  Sedated on vent, easily agitated per RN despite propofol, fent gtts  HEENT:  Mm moist, ETT  Cardiovascular:  s1s2 rrr Lungs:  resps even non labored on vent, coarse breath sounds  Abdomen:  Soft, hypoactive bs  Musculoskeletal:  Warm and dry  LABS:  BMET  Recent Labs Lab 09/09/16 1238 09/10/16 0253  09/11/16 0017 09/11/16 0551 09/11/16 1204  NA 139 140  --   --  139  --   K 3.9 3.4*  < > 4.0 3.9 3.6  CL  107 105  --   --  105  --   CO2 18* 19*  --   --  21*  --   BUN 114* 113*  --   --  115*  --   CREATININE 8.21* 8.29*  --   --  8.44*  --   GLUCOSE 84 81  --   --  93  --   < > = values in this interval not displayed.  Electrolytes  Recent Labs Lab 09/09/16 0943 09/09/16 1238 09/10/16 0253 09/11/16 0551  CALCIUM  --  7.5* 7.9* 8.1*  MG 2.3  --  2.1 2.2  PHOS 7.3*  --  6.9* 8.4*    CBC  Recent Labs Lab 09/09/16 0508 09/10/16 0253 09/11/16 0551  WBC 5.4 7.5 7.9  HGB 10.8* 10.3* 10.9*  HCT 31.9* 30.1* 31.2*  PLT 272 276 275    Coag's No results for input(s): APTT, INR in the last 168 hours.  Sepsis Markers  Recent Labs Lab 09/09/16 0943 09/09/16 1238  LATICACIDVEN 0.9 0.7    ABG  Recent Labs Lab 09/09/16 0656 09/09/16 0931  PHART 7.371 7.317*  PCO2ART 31.6* 42.5  PO2ART 57.0* 371.0*    Liver Enzymes  Recent Labs Lab 09/09/16 0509 09/10/16 0253  AST  45* 34  ALT 34 30  ALKPHOS 63 59  BILITOT 0.3 0.3  ALBUMIN 3.1* 2.8*    Cardiac Enzymes  Recent Labs Lab 09/09/16 0943 09/09/16 1439 09/09/16 2123  TROPONINI 0.44* 0.41* 0.33*    Glucose  Recent Labs Lab 09/10/16 1549 09/10/16 2028 09/10/16 2353 09/11/16 0601 09/11/16 0842 09/11/16 1150  GLUCAP 80 79 73 89 84 86    Imaging Dg Chest Port 1 View  Result Date: 09/11/2016 CLINICAL DATA:  Respiratory failure. EXAM: PORTABLE CHEST 1 VIEW COMPARISON:  09/10/2016. FINDINGS: Endotracheal tube and NG tube noted with tip projected under the left hemidiaphragm. Cardiomegaly with normal pulmonary vascularity. Right lower lobe infiltrate and right pleural effusion. Previously identified atelectatic changes left lung base have partially cleared. No pneumothorax . IMPRESSION: 1. Endotracheal tube and NG tube in stable position. 2. Right lower lobe infiltrate consistent with pneumonia. Small right pleural effusion. Previously identified atelectatic changes in the left lung base have partially  cleared . 3. Stable cardiomegaly. Electronically Signed   By: Marcello Moores  Register   On: 09/11/2016 12:24     STUDIES:  CT head 11/25 >> no acute findings  ECHO 11/25 >> moderate LVH, EF 60-65%, mild MR, patent PFO can not be excluded   CULTURES: BCx2 11/25 >>   ANTIBIOTICS:   SIGNIFICANT EVENTS: 11/25  Admit with hypertensive crisis, acute on chronic renal failure, resp fx requiring intubation.   11/27  Remains on cardene gtt, intermittent agitation   LINES/TUBES: ETT 11/25 >>  DISCUSSION: 55yo male with hx polysubstance abuse, CKD, uncontrolled HTN, dCHF admitted 11/25 with HTN crisis, acute on chronic renal failure and acute respiratory failure   ASSESSMENT / PLAN:  PULMONARY Acute respiratory failure - r/t AMS and pulmonary edema in setting HTN crisis and acute on CKD Pulmonary Edema - in setting of hypertensive crisis  P:   Vent support - 8cc/kg  Daily SBT / WUA as able  Follow CXR  Negative balance as able  CARDIOVASCULAR HTN crisis - poorly controlled HTN at baseline  Acute on chronic dCHF  Elevated troponin - mild.  Likely demand in setting severe HTN, respiratory distress  P:  Cardene gtt ECHO as above, ?'s PFO.  Consider TEE prior to extubation  Will need polysubstance abuse counseling   RENAL Acute on CKD - unclear baseline Scr.  Scr 8.5 on admit.   .  Metabolic acidosis  P:   Trend BMP / UOP  Ensure adequate BP control  Lasix per nephrology  No emergent need for HD   GASTROINTESTINAL No active issue  P:   TF per nutrition  PPI   HEMATOLOGIC No active issue  P:  F/u CBC  SQ heparin   INFECTIOUS No active issue  P:   Monitor wbc, fever curve off abx  Assess HIV, Hep C+ with drug abuse hx   ENDOCRINE No active issue    P:   Monitor glucose on chem   NEUROLOGIC AMS - suspect r/t polysubstance abuse and HTN crisis Polysubstance abuse  P:   RASS goal: 0 to -1 Propofol, fent gtt May need to transition to precedex but will need to  anticipate change in BP Daily WUA    FAMILY  - Updates:  No family available 11/27  - Inter-disciplinary family meet or Palliative Care meeting due by:  12/2   CC Time: 26 minutes   Noe Gens, NP-C [place] Pgr: [phone] or if no answer [phone] 09/11/2016, 1:50 PM  Attending Note:  I have examined patient, reviewed labs, studies and notes. I have discussed the case with B Ollis, and I agree with the data and plans as amended above. 55 yo man, hx substance abuse, HTN, CKD, admitted w HTN crisis and pulm edema requiring intubation. On my eval he is sedated, intubated. Lungs w bilateral insp crackles. We will continue diuresis and tight BP control w nicardipine. He has been agitated when sedation is lightened. May need to quickly assess WOB and extubate when sedation lifted to avoid recurrent HTN and flash pulm edema. Discussed with the pt's wife at bedside   Independent critical care time is 35 minutes.   Baltazar Apo, MD, PhD 09/11/2016, 8:24 PM [facility] [phone] or if no answer [phone]

## 2016-09-11 NOTE — Progress Notes (Signed)
  Echocardiogram 2D Echocardiogram has been performed.  Tresa Res 09/11/2016, 11:55 AM

## 2016-09-11 NOTE — Progress Notes (Signed)
Nutrition Follow-up  INTERVENTION:   Continue Nepro @ 30 mL/hr with 30 mL Prostat BID. TF plus current rate of propofol provides 1853 kcal (93% of estimated calorie needs), 88 grams of protein (100% of estimated protein needs), and 526 ml of water.   Once Propofol is discontinued, increase TF rate of Nepro to 45 ml/hr and discontinue Pro-stat to provide 1944 kcal, 87 grams of protein, and 778 ml of water.  NUTRITION DIAGNOSIS:   Inadequate oral intake related to inability to eat as evidenced by NPO status.  ongoing  GOAL:   Patient will meet greater than or equal to 90% of their needs  Being met  MONITOR:   Vent status, Weight trends, Labs, I & O's  REASON FOR ASSESSMENT:   Ventilator    ASSESSMENT:   55yo male with hx dCHF, colon cancer, uncontrolled HTN, CKD, polysubstance abuse presented 11/25 with SOB, chest pain after taking unknown "pain pill" that he bought off the street.  In ER had HTN with SBP 200's despite nitro gtt, acute on chronic renal failure with Scr 8.5, BNP 4628, metabolic acidosis.  He had waxing and waning mental status and worsening SOB despite bipap and was intubated in ER.  Started on cardene and propofol gtt with slow improvement in BP.   Patient is currently intubated on ventilator support MV: 11.1 L/min Temp (24hrs), Avg:97.8 F (36.6 C), Min:96.5 F (35.8 C), Max:100.1 F (37.8 C)  Propofol: 21.1 ml/hr (provides 557 kcal per 24 hours)  Per chart, pt was started on TF with Nepro @ 20 ml/hr 11/26 at 1730 hr and advanced to goal rate of 30 ml/hr at 2100 hr. TF plus current rate of propofol provides 1853 kcal (93% of estimated calorie needs) and 88 grams of protein (100% of estimated protein needs). Per RN pt is tolerated TF well. No plans for CRRT at this time as patient has been able to make urine. Per RN, plan to continue propofol at this time.   Labs: elevated phosphorus, high creatinine, low calcium, high BUN   Diet Order:  Diet NPO time  specified  Skin:  Reviewed, no issues  Last BM:  unknown  Height:   Ht Readings from Last 1 Encounters:  09/09/16 '6\' 2"'  (1.88 m)    Weight:   Wt Readings from Last 1 Encounters:  09/11/16 154 lb 8.7 oz (70.1 kg)    Ideal Body Weight:  86.36 kg  BMI:  Body mass index is 19.84 kg/m.  Estimated Nutritional Needs:   Kcal:  1990  Protein:  86-111 grams (1.2-1.5 grams/kg)  Fluid:  per MD given progressive kidney failure  EDUCATION NEEDS:   No education needs identified at this time  [place], CSP, LDN Inpatient Clinical Dietitian Pager: [phone] After Hours Pager: [phone]

## 2016-09-12 ENCOUNTER — Inpatient Hospital Stay (HOSPITAL_COMMUNITY): Payer: Medicaid Other

## 2016-09-12 DIAGNOSIS — G9341 Metabolic encephalopathy: Secondary | ICD-10-CM

## 2016-09-12 LAB — HEPATITIS PANEL, ACUTE
HEP A IGM: NEGATIVE
HEP B C IGM: NEGATIVE
HEP B S AG: NEGATIVE

## 2016-09-12 LAB — GLUCOSE, CAPILLARY
GLUCOSE-CAPILLARY: 75 mg/dL (ref 65–99)
GLUCOSE-CAPILLARY: 78 mg/dL (ref 65–99)
GLUCOSE-CAPILLARY: 78 mg/dL (ref 65–99)
Glucose-Capillary: 69 mg/dL (ref 65–99)
Glucose-Capillary: 79 mg/dL (ref 65–99)
Glucose-Capillary: 85 mg/dL (ref 65–99)

## 2016-09-12 LAB — BASIC METABOLIC PANEL
Anion gap: 18 — ABNORMAL HIGH (ref 5–15)
BUN: 131 mg/dL — ABNORMAL HIGH (ref 6–20)
CHLORIDE: 102 mmol/L (ref 101–111)
CO2: 17 mmol/L — ABNORMAL LOW (ref 22–32)
CREATININE: 8.85 mg/dL — AB (ref 0.61–1.24)
Calcium: 7.9 mg/dL — ABNORMAL LOW (ref 8.9–10.3)
GFR, EST AFRICAN AMERICAN: 7 mL/min — AB (ref >60)
GFR, EST NON AFRICAN AMERICAN: 6 mL/min — AB (ref >60)
Glucose, Bld: 75 mg/dL (ref 65–99)
POTASSIUM: 4.5 mmol/L (ref 3.5–5.1)
SODIUM: 137 mmol/L (ref 135–145)

## 2016-09-12 LAB — CBC
HCT: 31.6 % — ABNORMAL LOW (ref 39.0–52.0)
HEMOGLOBIN: 10.7 g/dL — AB (ref 13.0–17.0)
MCH: 28.7 pg (ref 26.0–34.0)
MCHC: 33.9 g/dL (ref 30.0–36.0)
MCV: 84.7 fL (ref 78.0–100.0)
PLATELETS: 235 10*3/uL (ref 150–400)
RBC: 3.73 MIL/uL — AB (ref 4.22–5.81)
RDW: 13.9 % (ref 11.5–15.5)
WBC: 8.8 10*3/uL (ref 4.0–10.5)

## 2016-09-12 LAB — TRIGLYCERIDES: TRIGLYCERIDES: 81 mg/dL (ref <150)

## 2016-09-12 LAB — POTASSIUM
POTASSIUM: 4.5 mmol/L (ref 3.5–5.1)
Potassium: 3.9 mmol/L (ref 3.5–5.1)
Potassium: 4.6 mmol/L (ref 3.5–5.1)

## 2016-09-12 LAB — HIV ANTIBODY (ROUTINE TESTING W REFLEX): HIV SCREEN 4TH GENERATION: NONREACTIVE

## 2016-09-12 MED ORDER — LEVOFLOXACIN IN D5W 500 MG/100ML IV SOLN
500.0000 mg | INTRAVENOUS | Status: DC
  Administered 2016-09-12: 500 mg via INTRAVENOUS
  Filled 2016-09-12: qty 100

## 2016-09-12 MED ORDER — NICARDIPINE HCL IN NACL 20-0.86 MG/200ML-% IV SOLN
3.0000 mg/h | INTRAVENOUS | Status: DC
  Administered 2016-09-12: 15 mg/h via INTRAVENOUS
  Administered 2016-09-12: 5 mg/h via INTRAVENOUS
  Administered 2016-09-12: 10 mg/h via INTRAVENOUS
  Administered 2016-09-12: 15 mg/h via INTRAVENOUS
  Administered 2016-09-12: 3 mg/h via INTRAVENOUS
  Administered 2016-09-13 (×2): 10 mg/h via INTRAVENOUS
  Filled 2016-09-12 (×11): qty 200

## 2016-09-12 MED ORDER — PNEUMOCOCCAL VAC POLYVALENT 25 MCG/0.5ML IJ INJ
0.5000 mL | INJECTION | INTRAMUSCULAR | Status: DC
  Filled 2016-09-12: qty 0.5

## 2016-09-12 MED ORDER — INFLUENZA VAC SPLIT QUAD 0.5 ML IM SUSY: 0.5000 mL | PREFILLED_SYRINGE | INTRAMUSCULAR | Status: DC

## 2016-09-12 MED ORDER — FAMOTIDINE 40 MG/5ML PO SUSR
10.0000 mg | Freq: Two times a day (BID) | ORAL | Status: DC
  Administered 2016-09-12: 10.4 mg via ORAL
  Filled 2016-09-12: qty 2.5

## 2016-09-12 NOTE — Care Management Note (Signed)
Case Management Note  Patient Details  Name: Timothy [name] MRN: [id number] Date of Birth: 1961/07/22  Subjective/Objective:  Pt admitted with resp failure                  Action/Plan:  Per wife ; PTA pt was independent from home.  Pt is now extubated but confused.  CSW consulted for polysubstance abuse.  CM will continue to follow for discharge needs   Expected Discharge Date:                  Expected Discharge Plan:     In-House Referral:     Discharge planning Services  CM Consult  Post Acute Care Choice:    Choice offered to:     DME Arranged:    DME Agency:     HH Arranged:    HH Agency:     Status of Service:  In process, will continue to follow  If discussed at Long Length of Stay Meetings, dates discussed:    Additional Comments:  Maryclare Labrador, RN 09/12/2016, 3:45 PM

## 2016-09-12 NOTE — Progress Notes (Signed)
225 ml of fentanyl wasted and witnessed by Jake Bathe RN

## 2016-09-12 NOTE — Progress Notes (Signed)
PULMONARY / CRITICAL CARE MEDICINE   Name: Timothy [name] MRN: [id number] DOB: [birth year]    ADMISSION DATE:  09/09/2016 CONSULTATION DATE:  11/23  REFERRING MD:  Tomi Bamberger (EDP)   CHIEF COMPLAINT:  HTN crisis, resp failure   HISTORY OF PRESENT ILLNESS:   55yo male with hx dCHF, colon cancer, uncontrolled HTN, CKD, polysubstance abuse presented 11/25 with SOB, chest pain after taking unknown "pain pill" that he bought off the street.  In ER had HTN with SBP 200's despite nitro gtt, acute on chronic renal failure with Scr 8.5, BNP 4008, metabolic acidosis.  He had waxing and waning mental status and worsening SOB despite bipap and was intubated in ER.  Started on cardene and propofol gtt with slow improvement in BP.  PCCM called to admit.     SUBJECTIVE:   Has tolerated decrease in sedation this am, still on some propofol and fentanyl No sbt yet this am nicardipine at 2  Good UOP on high dose lasix  VITAL SIGNS: BP 123/79   Pulse 79   Temp 97.4 F (36.3 C) (Oral)   Resp 16   Ht 6\' 2"  (1.88 m)   Wt 70.5 kg (155 lb 6.8 oz)   SpO2 99%   BMI 19.96 kg/m   HEMODYNAMICS:    VENTILATOR SETTINGS: Vent Mode: PRVC FiO2 (%):  [40 %] 40 % Set Rate:  [16 bmp] 16 bmp Vt Set:  [650 mL] 650 mL PEEP:  [5 cmH20] 5 cmH20 Plateau Pressure:  [17 cmH20-21 cmH20] 17 cmH20  INTAKE / OUTPUT: I/O last 3 completed shifts: In: 5536.9 [I.V.:3481.9; NG/GT:1584; IV Piggyback:471] Out: 5735 [QPYPP:5093]  PHYSICAL EXAMINATION: General:  Chronically ill appearing male, NAD  Neuro:  Sedated on vent, nodded to question this am, moves all ext HEENT:  Mm moist, ETT  Cardiovascular:  s1s2 rrr Lungs:  resps even non labored on vent, coarse breath sounds  Abdomen:  Soft, hypoactive bs  Musculoskeletal:  Warm and dry  LABS:  BMET  Recent Labs Lab 09/10/16 0253  09/11/16 0551  09/11/16 1825 09/11/16 2345 09/12/16 0548  NA 140  --  139  --   --   --  137  K 3.4*  < > 3.9  < > 3.5 3.9 4.5  CL  105  --  105  --   --   --  102  CO2 19*  --  21*  --   --   --  17*  BUN 113*  --  115*  --   --   --  131*  CREATININE 8.29*  --  8.44*  --   --   --  8.85*  GLUCOSE 81  --  93  --   --   --  75  < > = values in this interval not displayed.  Electrolytes  Recent Labs Lab 09/09/16 0943  09/10/16 0253 09/11/16 0551 09/12/16 0548  CALCIUM  --   < > 7.9* 8.1* 7.9*  MG 2.3  --  2.1 2.2  --   PHOS 7.3*  --  6.9* 8.4*  --   < > = values in this interval not displayed.  CBC  Recent Labs Lab 09/10/16 0253 09/11/16 0551 09/12/16 0548  WBC 7.5 7.9 8.8  HGB 10.3* 10.9* 10.7*  HCT 30.1* 31.2* 31.6*  PLT 276 275 235    Coag's No results for input(s): APTT, INR in the last 168 hours.  Sepsis Markers  Recent Labs Lab 09/09/16 0943 09/09/16 1238  LATICACIDVEN 0.9 0.7    ABG  Recent Labs Lab 09/09/16 0656 09/09/16 0931  PHART 7.371 7.317*  PCO2ART 31.6* 42.5  PO2ART 57.0* 371.0*    Liver Enzymes  Recent Labs Lab 09/09/16 0509 09/10/16 0253  AST 45* 34  ALT 34 30  ALKPHOS 63 59  BILITOT 0.3 0.3  ALBUMIN 3.1* 2.8*    Cardiac Enzymes  Recent Labs Lab 09/09/16 0943 09/09/16 1439 09/09/16 2123  TROPONINI 0.44* 0.41* 0.33*    Glucose  Recent Labs Lab 09/11/16 1150 09/11/16 1558 09/11/16 1943 09/11/16 2315 09/12/16 0425 09/12/16 0735  GLUCAP 86 82 102* 104* 85 75    Imaging Dg Chest Port 1 View  Result Date: 09/12/2016 CLINICAL DATA:  Respiratory failure. EXAM: PORTABLE CHEST 1 VIEW COMPARISON:  09/11/2016. FINDINGS: Endotracheal tube and NG tube in stable position. Interim partial clearing of right right base infiltrate. Interim i partial resolution right pleural effusion. No pneumothorax stable cardiomegaly. No acute bony abnormality . IMPRESSION: 1. Lines and tubes in stable position. 2. Interim partial clearing of right lower lobe infiltrate and right pleural effusion. Electronically Signed   By: Marcello Moores  Register   On: 09/12/2016 07:44    Dg Chest Port 1 View  Result Date: 09/11/2016 CLINICAL DATA:  Respiratory failure. EXAM: PORTABLE CHEST 1 VIEW COMPARISON:  09/10/2016. FINDINGS: Endotracheal tube and NG tube noted with tip projected under the left hemidiaphragm. Cardiomegaly with normal pulmonary vascularity. Right lower lobe infiltrate and right pleural effusion. Previously identified atelectatic changes left lung base have partially cleared. No pneumothorax . IMPRESSION: 1. Endotracheal tube and NG tube in stable position. 2. Right lower lobe infiltrate consistent with pneumonia. Small right pleural effusion. Previously identified atelectatic changes in the left lung base have partially cleared . 3. Stable cardiomegaly. Electronically Signed   By: Marcello Moores  Register   On: 09/11/2016 12:24     STUDIES:  CT head 11/25 >> no acute findings  ECHO 11/25 >> moderate LVH, EF 60-65%, mild MR, patent PFO can not be excluded   CULTURES: BCx2 11/25 >>   ANTIBIOTICS:   SIGNIFICANT EVENTS: 11/25  Admit with hypertensive crisis, acute on chronic renal failure, resp fx requiring intubation.   11/27  Remains on cardene gtt, intermittent agitation   LINES/TUBES: ETT 11/25 >>  DISCUSSION: 55yo male with hx polysubstance abuse, CKD, uncontrolled HTN, dCHF admitted 11/25 with HTN crisis, acute on chronic renal failure and acute respiratory failure   ASSESSMENT / PLAN:  PULMONARY Acute respiratory failure - r/t AMS and pulmonary edema in setting HTN crisis and acute on CKD Pulmonary Edema - in setting of hypertensive crisis  P:   Vent support - 8cc/kg  Daily SBT / WUA, goal possible extubation 11/28 depending on wakeup Follow CXR  Negative balance as able  CARDIOVASCULAR HTN crisis - poorly controlled HTN at baseline  Acute on chronic dCHF  Elevated troponin - mild.  Likely demand in setting severe HTN, respiratory distress  P:  Cardene gtt ECHO as above, ?'s PFO.  Consider TEE  Will need polysubstance abuse counseling    RENAL Acute on CKD - unclear baseline Scr.  Scr 8.5 on admit.   .  Metabolic acidosis  P:   Trend BMP / UOP  Ensure adequate BP control  Appreciate Nephrology assistance No emergent need for HD  High dose lasix as ordered, will defer to nephrology timing decrease dosing.   GASTROINTESTINAL Hep C Ab positive P:   TF per nutrition  PPI  Check Hep C PCR,  may need GI eval and follow up after this acute illness  HEMATOLOGIC No active issue  P:  F/u CBC  SQ heparin   INFECTIOUS No active issue  HIV negative this admission P:   Monitor wbc, fever curve off abx   ENDOCRINE No active issue    P:   Monitor glucose on chem   NEUROLOGIC AMS - suspect r/t polysubstance abuse and HTN crisis Polysubstance abuse  Agitation on MV P:   RASS goal: 0 to -1 Propofol, fent gtt May need to transition to precedex to allow extubation  Daily WUA    FAMILY  - Updates:  No family available 11/27  - Inter-disciplinary family meet or Palliative Care meeting due by:  12/2   Independent CC time 27 minutes   Baltazar Apo, MD, PhD 09/12/2016, 9:25 AM [facility] [phone] or if no answer [phone]

## 2016-09-12 NOTE — Procedures (Signed)
Extubation Procedure Note  Patient Details:   Name: Timothy [name] DOB: 06-18-1961 MRN: [id number]   Airway Documentation:     Evaluation  O2 sats: stable throughout Complications: No apparent complications Patient did tolerate procedure well. Bilateral Breath Sounds: Clear   Yes   Pt. Was extubated to a 6L   With RN & Dr. Lamonte Sakai at the bedside without any complications, dyspnea or stridor noted.   Claretta Fraise 09/12/2016, 09:40 AM

## 2016-09-13 DIAGNOSIS — D62 Acute posthemorrhagic anemia: Secondary | ICD-10-CM

## 2016-09-13 DIAGNOSIS — N179 Acute kidney failure, unspecified: Secondary | ICD-10-CM

## 2016-09-13 DIAGNOSIS — E871 Hypo-osmolality and hyponatremia: Secondary | ICD-10-CM

## 2016-09-13 DIAGNOSIS — Z85038 Personal history of other malignant neoplasm of large intestine: Secondary | ICD-10-CM

## 2016-09-13 DIAGNOSIS — D72829 Elevated white blood cell count, unspecified: Secondary | ICD-10-CM

## 2016-09-13 LAB — BASIC METABOLIC PANEL
Anion gap: 19 — ABNORMAL HIGH (ref 5–15)
BUN: 142 mg/dL — AB (ref 6–20)
CO2: 16 mmol/L — ABNORMAL LOW (ref 22–32)
Calcium: 8.4 mg/dL — ABNORMAL LOW (ref 8.9–10.3)
Chloride: 97 mmol/L — ABNORMAL LOW (ref 101–111)
Creatinine, Ser: 8.58 mg/dL — ABNORMAL HIGH (ref 0.61–1.24)
GFR, EST AFRICAN AMERICAN: 7 mL/min — AB (ref >60)
GFR, EST NON AFRICAN AMERICAN: 6 mL/min — AB (ref >60)
Glucose, Bld: 86 mg/dL (ref 65–99)
POTASSIUM: 4.1 mmol/L (ref 3.5–5.1)
SODIUM: 132 mmol/L — AB (ref 135–145)

## 2016-09-13 LAB — URINALYSIS, ROUTINE W REFLEX MICROSCOPIC
BILIRUBIN URINE: NEGATIVE
Glucose, UA: NEGATIVE mg/dL
Ketones, ur: NEGATIVE mg/dL
Leukocytes, UA: NEGATIVE
Nitrite: NEGATIVE
PH: 5 (ref 5.0–8.0)
Protein, ur: 100 mg/dL — AB
SPECIFIC GRAVITY, URINE: 1.011 (ref 1.005–1.030)

## 2016-09-13 LAB — CBC
HCT: 28.6 % — ABNORMAL LOW (ref 39.0–52.0)
HEMOGLOBIN: 10 g/dL — AB (ref 13.0–17.0)
MCH: 28.7 pg (ref 26.0–34.0)
MCHC: 35 g/dL (ref 30.0–36.0)
MCV: 82.2 fL (ref 78.0–100.0)
PLATELETS: 257 10*3/uL (ref 150–400)
RBC: 3.48 MIL/uL — AB (ref 4.22–5.81)
RDW: 13.9 % (ref 11.5–15.5)
WBC: 14 10*3/uL — AB (ref 4.0–10.5)

## 2016-09-13 LAB — GLUCOSE, CAPILLARY
GLUCOSE-CAPILLARY: 66 mg/dL (ref 65–99)
GLUCOSE-CAPILLARY: 97 mg/dL (ref 65–99)
GLUCOSE-CAPILLARY: 85 mg/dL (ref 65–99)
GLUCOSE-CAPILLARY: 67 mg/dL (ref 65–99)
Glucose-Capillary: 78 mg/dL (ref 65–99)
Glucose-Capillary: 103 mg/dL — ABNORMAL HIGH (ref 65–99)

## 2016-09-13 LAB — URINE MICROSCOPIC-ADD ON

## 2016-09-13 LAB — POTASSIUM: POTASSIUM: 4.5 mmol/L (ref 3.5–5.1)

## 2016-09-13 MED ORDER — ADULT MULTIVITAMIN W/MINERALS CH
1.0000 | ORAL_TABLET | Freq: Every day | ORAL | Status: DC
  Administered 2016-09-13: 1 via ORAL
  Filled 2016-09-13: qty 1

## 2016-09-13 MED ORDER — IPRATROPIUM-ALBUTEROL 0.5-2.5 (3) MG/3ML IN SOLN: 3.0000 mL | Freq: Three times a day (TID) | RESPIRATORY_TRACT | Status: DC

## 2016-09-13 MED ORDER — IPRATROPIUM-ALBUTEROL 0.5-2.5 (3) MG/3ML IN SOLN: 3.0000 mL | RESPIRATORY_TRACT | Status: DC | PRN

## 2016-09-13 MED ORDER — FUROSEMIDE 10 MG/ML IJ SOLN
160.0000 mg | Freq: Two times a day (BID) | INTRAVENOUS | Status: DC
  Administered 2016-09-13: 160 mg via INTRAVENOUS
  Filled 2016-09-13 (×5): qty 16

## 2016-09-13 MED ORDER — ISOSORB DINITRATE-HYDRALAZINE 20-37.5 MG PO TABS
1.0000 | ORAL_TABLET | Freq: Three times a day (TID) | ORAL | Status: DC
  Administered 2016-09-13 – 2016-09-21 (×18): 1 via ORAL
  Filled 2016-09-13 (×18): qty 1

## 2016-09-13 MED ORDER — TRAZODONE HCL 50 MG PO TABS
50.0000 mg | ORAL_TABLET | Freq: Every evening | ORAL | Status: DC | PRN
  Administered 2016-09-15: 50 mg via ORAL
  Filled 2016-09-13: qty 1

## 2016-09-13 MED ORDER — TUBERCULIN PPD 5 UNIT/0.1ML ID SOLN
5.0000 [IU] | Freq: Once | INTRADERMAL | Status: AC
  Administered 2016-09-13: 5 [IU] via INTRADERMAL
  Filled 2016-09-13: qty 0.1

## 2016-09-13 MED ORDER — AMLODIPINE BESYLATE 10 MG PO TABS
10.0000 mg | ORAL_TABLET | Freq: Every day | ORAL | Status: DC
  Administered 2016-09-13 – 2016-09-14 (×2): 10 mg via ORAL
  Filled 2016-09-13 (×2): qty 1

## 2016-09-13 MED ORDER — LABETALOL HCL 100 MG PO TABS
100.0000 mg | ORAL_TABLET | Freq: Three times a day (TID) | ORAL | Status: DC
  Administered 2016-09-13 – 2016-09-17 (×8): 100 mg via ORAL
  Filled 2016-09-13 (×8): qty 1

## 2016-09-13 MED ORDER — LORAZEPAM 0.5 MG PO TABS
0.5000 mg | ORAL_TABLET | Freq: Two times a day (BID) | ORAL | Status: DC
  Administered 2016-09-13 – 2016-09-21 (×13): 0.5 mg via ORAL
  Filled 2016-09-13 (×13): qty 1

## 2016-09-13 MED ORDER — TAMSULOSIN HCL 0.4 MG PO CAPS
0.4000 mg | ORAL_CAPSULE | Freq: Every day | ORAL | Status: DC
  Administered 2016-09-13 – 2016-09-21 (×8): 0.4 mg via ORAL
  Filled 2016-09-13 (×8): qty 1

## 2016-09-13 MED ORDER — ZOLPIDEM TARTRATE 5 MG PO TABS
5.0000 mg | ORAL_TABLET | Freq: Every evening | ORAL | Status: DC | PRN
  Administered 2016-09-13 – 2016-09-17 (×2): 5 mg via ORAL
  Filled 2016-09-13 (×2): qty 1

## 2016-09-13 NOTE — Progress Notes (Addendum)
PULMONARY / CRITICAL CARE MEDICINE   Name: Timothy [name] MRN: [id number] DOB: 02/08/1961    ADMISSION DATE:  09/09/2016 CONSULTATION DATE:  11/23  REFERRING MD:  Tomi Bamberger (EDP)   CHIEF COMPLAINT:  HTN crisis, resp failure   HISTORY OF PRESENT ILLNESS:   55yo male with hx dCHF, colon cancer, uncontrolled HTN, CKD, polysubstance abuse presented 11/25 with SOB, chest pain after taking unknown "pain pill" that he bought off the street.  In ER had HTN with SBP 200's despite nitro gtt, acute on chronic renal failure with Scr 8.5, BNP 5009, metabolic acidosis.  He had waxing and waning mental status and worsening SOB despite bipap and was intubated in ER.  Started on cardene and propofol gtt with slow improvement in BP.  PCCM called to admit.     SUBJECTIVE:   Extubated yesterday, tolerated well.  Hypoglycemia events overnight.  Seen by PT, recommended for CIR.    VITAL SIGNS: BP (!) 145/83 (BP Location: Left Arm)   Pulse 95   Temp 98.9 F (37.2 C) (Oral)   Resp (!) 22   Ht 6\' 2"  (1.88 m)   Wt 155 lb 6.8 oz (70.5 kg)   SpO2 93%   BMI 19.96 kg/m   HEMODYNAMICS:    VENTILATOR SETTINGS: Vent Mode: PSV;CPAP FiO2 (%):  [40 %] 40 % PEEP:  [5 cmH20] 5 cmH20 Pressure Support:  [12 cmH20] 12 cmH20  INTAKE / OUTPUT: I/O last 3 completed shifts: In: 4414.5 [P.O.:720; I.V.:2589.5; NG/GT:600; IV [id number]:169] Out: 4150 [Urine:4150]  PHYSICAL EXAMINATION: General:  Chronically ill appearing male, NAD  Neuro:  Awake, alert, sitting in chair, MAE PSY:  Tearful at times, talking about his experiences in prison and fear HEENT:  MM pink/moist, no jvd  Cardiovascular:  s1s2 rrr Lungs:  resps even non labored, lungs bilaterally clear   Abdomen:  Soft, hypoactive bs  Musculoskeletal:  Warm and dry  LABS:  BMET  Recent Labs Lab 09/11/16 0551  09/12/16 0548  09/12/16 1810 09/13/16 0012 09/13/16 0554  NA 139  --  137  --   --   --  132*  K 3.9  < > 4.5  < > 4.6 4.5 4.1  CL 105  --   102  --   --   --  97*  CO2 21*  --  17*  --   --   --  16*  BUN 115*  --  131*  --   --   --  142*  CREATININE 8.44*  --  8.85*  --   --   --  8.58*  GLUCOSE 93  --  75  --   --   --  86  < > = values in this interval not displayed.  Electrolytes  Recent Labs Lab 09/09/16 0943  09/10/16 0253 09/11/16 0551 09/12/16 0548 09/13/16 0554  CALCIUM  --   < > 7.9* 8.1* 7.9* 8.4*  MG 2.3  --  2.1 2.2  --   --   PHOS 7.3*  --  6.9* 8.4*  --   --   < > = values in this interval not displayed.  CBC  Recent Labs Lab 09/11/16 0551 09/12/16 0548 09/13/16 0554  WBC 7.9 8.8 14.0*  HGB 10.9* 10.7* 10.0*  HCT 31.2* 31.6* 28.6*  PLT 275 235 257    Coag's No results for input(s): APTT, INR in the last 168 hours.  Sepsis Markers  Recent Labs Lab 09/09/16 0943 09/09/16 1238  LATICACIDVEN 0.9  0.7    ABG  Recent Labs Lab 09/09/16 0656 09/09/16 0931  PHART 7.371 7.317*  PCO2ART 31.6* 42.5  PO2ART 57.0* 371.0*    Liver Enzymes  Recent Labs Lab 09/09/16 0509 09/10/16 0253  AST 45* 34  ALT 34 30  ALKPHOS 63 59  BILITOT 0.3 0.3  ALBUMIN 3.1* 2.8*    Cardiac Enzymes  Recent Labs Lab 09/09/16 0943 09/09/16 1439 09/09/16 2123  TROPONINI 0.44* 0.41* 0.33*    Glucose  Recent Labs Lab 09/12/16 1209 09/12/16 1559 09/12/16 1924 09/12/16 2334 09/13/16 0344 09/13/16 0432  GLUCAP 69 78 78 79 66 97    Imaging No results found.   STUDIES:  CT head 11/25 >> no acute findings  ECHO 11/25 >> moderate LVH, EF 60-65%, mild MR, patent PFO can not be excluded   CULTURES: BCx2 11/25 >>   ANTIBIOTICS: Levaquin 11/28 (empiric, thick secretions) >> 11/29  SIGNIFICANT EVENTS: 11/25  Admit with hypertensive crisis, acute on chronic renal failure, resp fx requiring intubation.  11/27  Remains on cardene gtt, intermittent agitation  11/28  Extubated 11/29  Remains on cardene, tolerated extubation   LINES/TUBES: ETT 11/25 >>  DISCUSSION: 55 y/o male with hx  polysubstance abuse, CKD, uncontrolled HTN, dCHF admitted 11/25 with HTN crisis, acute on chronic renal failure and acute respiratory failure   ASSESSMENT / PLAN:  PULMONARY Acute respiratory failure - r/t AMS and pulmonary edema in setting HTN crisis and acute on CKD.  Resolved.  Pulmonary Edema - in setting of hypertensive crisis  P:   Pulmonary hygiene - IS, mobilize  Follow CXR  Negative balance as able  CARDIOVASCULAR HTN crisis - poorly controlled HTN at baseline  Acute on chronic dCHF  Elevated troponin - mild.  Likely demand in setting severe HTN, respiratory distress  P:  Wean Cardene gtt to off  ECHO as above, ?'s PFO.  Consider f/u TEE.  No mention on prior echo.  Will need polysubstance abuse counseling  Resume home labetalol at reduced dose Resume home bidil, norvasc  RENAL Acute on CKD - unclear baseline Scr.  Scr 8.5 on admit.   .  Metabolic acidosis   BPH P:   Trend BMP / UOP  Ensure adequate BP control  Appreciate Nephrology assistance No emergent need for HD  Lasix 160 mg IV, reduced to Q12 dosing  Will need Urology f/u post discharge for BPH  GASTROINTESTINAL Hep C Ab positive P:   TF per nutrition  PPI  Check Hep C PCR, may need GI eval and follow up after this acute illness  HEMATOLOGIC Anemia P:  F/u CBC  SQ heparin   INFECTIOUS HIV negative this admission Thick Sputum post extubation  P:   Monitor wbc, fever curve  D/c levaquin   ENDOCRINE Hypoglycemia    P:   Monitor glucose on chem   NEUROLOGIC AMS - suspect r/t polysubstance abuse and HTN crisis Polysubstance abuse  Agitation on MV - resolved  Anxiety / Depression  P:   Supportive care Consider outpatient PSY evaluation / counseling  Will ask SW to provide outpatient resources   FAMILY  - Updates:  Patient updated on plan of care 11/29  - Inter-disciplinary family meet or Palliative Care meeting due by:  12/2  CC Time: 30 minutes   Wean cardene gtt off.  Tx to  Tele once off gtt.  TRH as of 11/30 am.    Noe Gens, NP-C [facility] Pgr: [phone] or if no  answer [phone] 09/13/2016, 8:24 AM   STAFF NOTE: I, Merrie Roof, MD FACP have personally reviewed patient's available data, including medical history, events of note, physical examination and test results as part of my evaluation. I have discussed with resident/NP and other care providers such as pharmacist, RN and RRT. In addition, I personally evaluated patient and elicited key findings of:  Awake, follows commands, lungs clear, BP on Cardene controlled, goal remains 25% -30% reduction, now day 3  Can reduce further, titrate drip as we add oral agents, renal failure, baseline?, renal consult, get hiv, UA, echo, I updated pt, observe for etoh wd, consider benzo scheduled, consider dc foley after renal sees The patient is critically ill with multiple organ systems failure and requires high complexity decision making for assessment and support, frequent evaluation and titration of therapies, application of advanced monitoring technologies and extensive interpretation of multiple databases.   Critical Care Time devoted to patient care services described in this note is 30 Minutes. This time reflects time of care of this signee: Merrie Roof, MD FACP. This critical care time does not reflect procedure time, or teaching time or supervisory time of PA/NP/Med student/Med Resident etc but could involve care discussion time. Rest per NP/medical resident whose note is outlined above and that I agree with   Lavon Paganini. Titus Mould, MD, [place] Pgr: [facility] 09/13/2016 10:54 AM

## 2016-09-13 NOTE — Consult Note (Signed)
Physical Medicine and Rehabilitation Consult   Reason for Consult: Debility Referring Physician: Dr. Titus Mould.    HPI: Timothy [name] is a 55 y.o. male with history of CHF, colon cancer, CKD, polysubstance abuse who was admitted on 09/09/16 with SOB and chest pain after buying unknown pain pill on the street. He was admitted with hypertensive crises with acute on chronic renal failure and had waxing and waning of mental status with respiratory distress requiring intubation in ED. He was placed on propofol and fentanyl and required cardene drip. He has had issues with agitation and CT head negative for acute changes. Noted to have mild elevation in cardiac enzymes due likely multifactorial and 2 D echo with moderate LVH with EF 60-65% and question of PFO. He tolerated extubation on 11/28 but noted to be encephalopathic. PT evaluation revealed ataxia with generalized weakness and CIR recommended for follow up therapy.    Independent without AD and manages home. Has been in [place] for 2 years. Wife is a Print production planner.   Review of Systems  HENT: Negative for hearing loss and tinnitus.   Eyes: Negative for blurred vision and double vision.  Respiratory: Positive for sputum production. Negative for cough and shortness of breath.   Cardiovascular: Negative for chest pain and palpitations.  Gastrointestinal: Negative for abdominal pain and heartburn.  Genitourinary: Negative for dysuria and urgency.       Hesitancy--has to stand and wait.   Musculoskeletal: Negative for back pain, joint pain, myalgias and neck pain.  Neurological: Positive for weakness. Negative for dizziness and headaches.  Psychiatric/Behavioral: Positive for memory loss. The patient has insomnia.   All other systems reviewed and are negative.   Past Medical History:  Diagnosis Date  . Acute diastolic heart failure ([facility]) 07/17/2016  . BPH (benign prostatic hyperplasia)   . Colon cancer ([place]) 2014  . Elevated  troponin   . Hypertension     Past Surgical History:  Procedure Laterality Date  . ABDOMINAL SURGERY    . COLON SURGERY  2014    Family History  Problem Relation Age of Onset  . Heart disease Mother     Died at age 10.  Marland Kitchen Heart failure Mother   . Kidney failure Sister    Social History:  Married. Disabled  He reports that he has been smoking 3 Cigarettes/day.  He has been smoking about 0.25 packs per day. He has never used smokeless tobacco. He reports that he drinks about 4.2 oz of alcohol per week . He reports that he uses drugs, including Marijuana and Heroin, about 1 time per week.   Allergies: No Known Allergies    Medications Prior to Admission  Medication Sig Dispense Refill  . acetaminophen (TYLENOL) 500 MG tablet Take 1,500 mg by mouth every 6 (six) hours as needed for headache (pain).    Marland Kitchen amLODipine (NORVASC) 10 MG tablet Take 1 tablet (10 mg total) by mouth daily. 3 tablet 30  . labetalol (NORMODYNE) 300 MG tablet Take 300 mg by mouth 2 (two) times daily.    . Multiple Vitamin (MULTIVITAMIN WITH MINERALS) TABS tablet Take 1 tablet by mouth daily. (Patient taking differently: Take 1 tablet by mouth daily. Men's One a Day) 30 tablet 3  . spironolactone (ALDACTONE) 25 MG tablet Take 1 tablet (25 mg total) by mouth 2 (two) times daily. 60 tablet 3  . cyclobenzaprine (FLEXERIL) 10 MG tablet Take 1 tablet (10 mg total) by mouth 3 (three) times daily as  needed for muscle spasms. (Patient not taking: Reported on 09/10/2016) 30 tablet 0  . folic acid (FOLVITE) 1 MG tablet Take 1 tablet (1 mg total) by mouth daily. (Patient not taking: Reported on 09/10/2016) 30 tablet 3  . HYDROcodone-acetaminophen (NORCO/VICODIN) 5-325 MG tablet Take 1 tablet by mouth every 4 (four) hours as needed for severe pain. (Patient not taking: Reported on 09/10/2016) 10 tablet 0  . isosorbide-hydrALAZINE (BIDIL) 20-37.5 MG tablet Take 1 tablet by mouth 2 (two) times daily. (Patient not taking: Reported on  09/10/2016) 30 tablet 0  . labetalol (NORMODYNE) 200 MG tablet Take 1 tablet (200 mg total) by mouth 2 (two) times daily. (Patient not taking: Reported on 09/10/2016) 60 tablet 3  . pravastatin (PRAVACHOL) 40 MG tablet Take 1 tablet (40 mg total) by mouth daily. (Patient not taking: Reported on 09/10/2016) 90 tablet 3  . tamsulosin (FLOMAX) 0.4 MG CAPS capsule Take 1 capsule (0.4 mg total) by mouth daily. (Patient not taking: Reported on 09/10/2016) 30 capsule 3  . thiamine 100 MG tablet Take 1 tablet (100 mg total) by mouth daily. (Patient not taking: Reported on 09/10/2016) 30 tablet 3    Home: [place] expects to be discharged to:: Inpatient rehab Additional Comments: pt lived at home with wife in 1 story home with 2 steps to enter, no handrail  Functional History: Prior Function Level of Independence: Independent Comments: pt was indep and working PTA. wife works during the day, 2nd shift Functional Status:  Mobility: Bed Mobility General bed mobility comments: pt up at EOB with RN staff upon PT arrival Transfers Overall transfer level: Needs assistance Equipment used: Rolling walker (2 wheeled) Transfers: Sit to/from Stand, [employer] Transfers Sit to Stand: Mod assist Stand pivot transfers: Mod assist, +2 physical assistance General transfer comment: pt very shaky with waxing and waining forward/backwards requiring modA to maintain balance. pt with ataxic LE steps to chair. 2 person HHA to transfer to chair Ambulation/Gait Ambulation/Gait assistance: Mod assist, +2 safety/equipment Ambulation Distance (Feet): 10 Feet Assistive device: Rolling walker (2 wheeled) Gait Pattern/deviations: Step-through pattern, Decreased stride length, Ataxic, Narrow base of support, Trunk flexed General Gait Details: attempted x2. first time had pt march and pt unable to sequence or clear either foot due to fatigue and weakness. Pt then tried again s/p 5 min. Pt able to amb 10'  with RW wit modA to steady pt. pt with near crossover gait pattern, staggering L/R and ant/post Gait velocity: slow Gait velocity interpretation: Below normal speed for age/gender    ADL:    Cognition: Cognition Overall Cognitive Status: Impaired/Different from baseline Orientation Level: Oriented to person, Oriented to place, Oriented to situation, Disoriented to time Cognition Arousal/Alertness: Awake/alert Behavior During Therapy: Flat affect Overall Cognitive Status: Impaired/Different from baseline Area of Impairment: Following commands, Safety/judgement, Awareness, Problem solving Following Commands: Follows one step commands with increased time, Follows multi-step commands inconsistently Safety/Judgement: Decreased awareness of safety, Decreased awareness of deficits Awareness: Emergent Problem Solving: Slow processing, Decreased initiation, Difficulty sequencing, Requires verbal cues, Requires tactile cues General Comments: pt slow to respond/process   Blood pressure 128/80, pulse 74, temperature 98.2 F (36.8 C), temperature source Oral, resp. rate 19, height 6\' 2"  (1.88 m), weight 70.5 kg (155 lb 6.8 oz), SpO2 97 %. Physical Exam  Nursing note and vitals reviewed. Constitutional: He is oriented to person, place, and time. He appears well-developed and well-nourished.  [place]  HENT:  Head: Normocephalic and atraumatic.  Eyes: Conjunctivae and EOM are normal.  Pupils are equal, round, and reactive to light.  Neck: Normal range of motion. Neck supple.  Cardiovascular: Normal rate and regular rhythm.   Respiratory: Effort normal. No stridor. He has decreased breath sounds. He has no wheezes.  GI: Soft. Bowel sounds are normal. He exhibits no distension. There is no tenderness.  Musculoskeletal: He exhibits no edema or tenderness.  Neurological: He is alert and oriented to person, place, and time.  Unable to relay cause of disability.   Delayed processing with slow  speech.  Needed redirection due to distraction.  Slow movements.  Motor: B/l UE: 4+/5 proximal to distal B/l LE: 4-/5 proximal to distal  Skin: Skin is warm and dry.  Psychiatric: His affect is blunt. His speech is delayed and tangential. He is agitated and slowed. Cognition and memory are impaired. He expresses inappropriate judgment. He is inattentive.    Results for orders placed or performed during the hospital encounter of 09/09/16 (from the past 24 hour(s))  Potassium     Status: None   Collection Time: 09/12/16 11:44 AM  Result Value Ref Range   Potassium 4.5 3.5 - 5.1 mmol/L  Glucose, capillary     Status: None   Collection Time: 09/12/16 12:09 PM  Result Value Ref Range   Glucose-Capillary 69 65 - 99 mg/dL   Comment 1 Notify RN   Glucose, capillary     Status: None   Collection Time: 09/12/16  3:59 PM  Result Value Ref Range   Glucose-Capillary 78 65 - 99 mg/dL   Comment 1 Notify RN   Potassium     Status: None   Collection Time: 09/12/16  6:10 PM  Result Value Ref Range   Potassium 4.6 3.5 - 5.1 mmol/L  Glucose, capillary     Status: None   Collection Time: 09/12/16  7:24 PM  Result Value Ref Range   Glucose-Capillary 78 65 - 99 mg/dL   Comment 1 Capillary Specimen    Comment 2 Notify RN    Comment 3 Document in Chart   Glucose, capillary     Status: None   Collection Time: 09/12/16 11:34 PM  Result Value Ref Range   Glucose-Capillary 79 65 - 99 mg/dL   Comment 1 Capillary Specimen    Comment 2 Notify RN    Comment 3 Document in Chart   Potassium     Status: None   Collection Time: 09/13/16 12:12 AM  Result Value Ref Range   Potassium 4.5 3.5 - 5.1 mmol/L  Glucose, capillary     Status: None   Collection Time: 09/13/16  3:44 AM  Result Value Ref Range   Glucose-Capillary 66 65 - 99 mg/dL   Comment 1 Capillary Specimen    Comment 2 Notify RN    Comment 3 Document in Chart   Glucose, capillary     Status: None   Collection Time: 09/13/16  4:32 AM  Result  Value Ref Range   Glucose-Capillary 97 65 - 99 mg/dL   Comment 1 Capillary Specimen   CBC     Status: Abnormal   Collection Time: 09/13/16  5:54 AM  Result Value Ref Range   WBC 14.0 (H) 4.0 - 10.5 K/uL   RBC 3.48 (L) 4.22 - 5.81 MIL/uL   Hemoglobin 10.0 (L) 13.0 - 17.0 g/dL   HCT 28.6 (L) 39.0 - 52.0 %   MCV 82.2 78.0 - 100.0 fL   MCH 28.7 26.0 - 34.0 pg   MCHC 35.0 30.0 - 36.0 g/dL  RDW 13.9 11.5 - 15.5 %   Platelets 257 150 - 400 K/uL  Basic metabolic panel     Status: Abnormal   Collection Time: 09/13/16  5:54 AM  Result Value Ref Range   Sodium 132 (L) 135 - 145 mmol/L   Potassium 4.1 3.5 - 5.1 mmol/L   Chloride 97 (L) 101 - 111 mmol/L   CO2 16 (L) 22 - 32 mmol/L   Glucose, Bld 86 65 - 99 mg/dL   BUN 142 (H) 6 - 20 mg/dL   Creatinine, Ser 8.58 (H) 0.61 - 1.24 mg/dL   Calcium 8.4 (L) 8.9 - 10.3 mg/dL   GFR calc non Af Amer 6 (L) >60 mL/min   GFR calc Af Amer 7 (L) >60 mL/min   Anion gap 19 (H) 5 - 15  Glucose, capillary     Status: None   Collection Time: 09/13/16  9:26 AM  Result Value Ref Range   Glucose-Capillary 78 65 - 99 mg/dL   Comment 1 Notify RN    Dg Chest Port 1 View  Result Date: 09/12/2016 CLINICAL DATA:  Respiratory failure. EXAM: PORTABLE CHEST 1 VIEW COMPARISON:  09/11/2016. FINDINGS: Endotracheal tube and NG tube in stable position. Interim partial clearing of right right base infiltrate. Interim i partial resolution right pleural effusion. No pneumothorax stable cardiomegaly. No acute bony abnormality . IMPRESSION: 1. Lines and tubes in stable position. 2. Interim partial clearing of right lower lobe infiltrate and right pleural effusion. Electronically Signed   By: Marcello Moores  Register   On: 09/12/2016 07:44   Dg Chest Port 1 View  Result Date: 09/11/2016 CLINICAL DATA:  Respiratory failure. EXAM: PORTABLE CHEST 1 VIEW COMPARISON:  09/10/2016. FINDINGS: Endotracheal tube and NG tube noted with tip projected under the left hemidiaphragm. Cardiomegaly with  normal pulmonary vascularity. Right lower lobe infiltrate and right pleural effusion. Previously identified atelectatic changes left lung base have partially cleared. No pneumothorax . IMPRESSION: 1. Endotracheal tube and NG tube in stable position. 2. Right lower lobe infiltrate consistent with pneumonia. Small right pleural effusion. Previously identified atelectatic changes in the left lung base have partially cleared . 3. Stable cardiomegaly. Electronically Signed   By: Marcello Moores  Register   On: 09/11/2016 12:24    Assessment/Plan: Diagnosis: Debility Labs and images independently reviewed.  Records reviewed and summated above.  1. Does the need for close, 24 hr/day medical supervision in concert with the patient's rehab needs make it unreasonable for this patient to be served in a less intensive setting? Yes  2. Co-Morbidities requiring supervision/potential complications: CHF (Monitor in accordance with increased physical activity and avoid UE resistance excercises), colon cancer, Severe AKI on CKD (avoid nephrotoxic meds, recs per Neprho, no need for HD at this point), polysubstance abuse (counsel), encephalopathy, hyponatremia (cont to monitor, treat if necessary), leukocytosis (cont to monitor for signs and symptoms of infection, further workup if indicated), ABLA on anemia of chronic disease (transfuse if necessary to ensure appropriate perfusion for increased activity tolerance)  3. Due to bladder management, safety, skin/wound care, disease management, pain management and patient education, does the patient require 24 hr/day rehab nursing? Yes 4. Does the patient require coordinated care of a physician, rehab nurse, PT (1-2 hrs/day, 5 days/week), OT (1-2 hrs/day, 5 days/week) and SLP (1-2 hrs/day, 5 days/week) to address physical and functional deficits in the context of the above medical diagnosis(es)? Yes Addressing deficits in the following areas: balance, endurance, locomotion, strength,  transferring, bowel/bladder control, bathing, dressing, toileting, cognition and  psychosocial support 5. Can the patient actively participate in an intensive therapy program of at least 3 hrs of therapy per day at least 5 days per week? Yes 6. The potential for patient to make measurable gains while on inpatient rehab is excellent 7. Anticipated functional outcomes upon discharge from inpatient rehab are supervision  with PT, supervision and min assist with OT, independent and modified independent with SLP. 8. Estimated rehab length of stay to reach the above functional goals is: 14-17 days. 9. Does the patient have adequate social supports and living environment to accommodate these discharge functional goals? Potentially 10. Anticipated D/C setting: Home 11. Anticipated post D/C treatments: HH therapy and Home excercise program 12. Overall Rehab/Functional Prognosis: good  RECOMMENDATIONS: This patient's condition is appropriate for continued rehabilitative care in the following setting: CIR when medically appropriate Patient has agreed to participate in recommended program. Potentially Note that insurance prior authorization may be required for reimbursement for recommended care.  Comment: Rehab Admissions Coordinator to follow up.  Delice Lesch, MD, Mellody Drown 09/13/2016

## 2016-09-13 NOTE — Progress Notes (Signed)
Hypoglycemic Event  CBG: 66  Treatment: 15 GM carbohydrate snack  Symptoms: Shaky, weak  Follow-up CBG: [id number] CBG Result:97  Possible Reasons for Event: Inadequate meal intake  Comments/MD notified: none    Rhina Brackett

## 2016-09-13 NOTE — Care Management Note (Signed)
Case Management Note  Patient Details  Name: Larone Kliethermes MRN: [id number] Date of Birth: 1961-05-10  Subjective/Objective:  Pt admitted with resp failure                  Action/Plan:  Per wife ; PTA pt was independent from home.  Pt is now extubated but confused.  CSW consulted for polysubstance abuse.  CM will continue to follow for discharge needs   Expected Discharge Date:                  Expected Discharge Plan:     In-House Referral:     Discharge planning Services  CM Consult  Post Acute Care Choice:    Choice offered to:     DME Arranged:    DME Agency:     HH Arranged:    HH Agency:     Status of Service:  In process, will continue to follow  If discussed at Long Length of Stay Meetings, dates discussed:    Additional Comments: 09/13/2016 Pt assessed pt and has recommended CIR - CIR consult ordered.  CSW consulted for SNF as back up plan Maryclare Labrador, RN 09/13/2016, 8:32 AM

## 2016-09-13 NOTE — Progress Notes (Signed)
Hypoglycemic Event  CBG: 66  Treatment: 15 GM carbohydrate snack  Symptoms: Shaky, weak  Follow-up CBG: [id number] CBG Result:97  Possible Reasons for Event: Inadequate meal intake  Comments/MD notified: none    Rhina Brackett

## 2016-09-13 NOTE — Progress Notes (Signed)
Rehab Admissions Coordinator Note:  Patient was screened by Retta Diones for appropriateness for an Inpatient Acute Rehab Consult.  At this time, we are recommending Inpatient Rehab consult.  Retta Diones 09/13/2016, 8:38 AM  I can be reached at [phone].

## 2016-09-13 NOTE — Progress Notes (Signed)
PPD test administered to LFA. Area marked for reference.  Barbie Haggis, RN

## 2016-09-13 NOTE — Progress Notes (Signed)
[facility] Progress Note    Assessment/ Plan:   1. 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  He appears to be somewhat uremic and will need to begin dialysis in the next 24-48 hours.  Have discussed this with pt.  Have placed order for permcath with IR .  NPO past MN ; Will talk to [facility] re: fistula placement in Am.  PPD ordered.  2. Volume overload: getting lasix 160 mg IV BID, urine output good, continue diuresis while awaiting dialysis  3.  Heroin abuse: ongoing  4.  Hepatitis C positive: rec sending genotype and PCR for VL  5. Anemia: Hgb 10.0, adding ferritin and iron  6.  BMMD: getting phos and PTH.  Wasn't taking binders, getting 2 phoslo QAC  7.  Malignant HTN: was not taking meds at home; at followup appointment 10/30 with Dr. Moshe Cipro BP systolics > 160.  Additional meds added back: amlodipine 10, bidil 20/37.5, labetalol 100 mg TID, lasix as above  Subjective:    Seen at bedside.  Complains of Foley catheter pain.  Good urine output with 160 mg IV lasix BID.  Cr in the 8s.  Has had repeated episodes of malignant HTN in the setting of ongoing drug abuse.  Essentially ESRD.   Objective:   BP 117/70   Pulse 79   Temp 98.7 F (37.1 C)   Resp 18   Ht 6\' 1"  (1.854 m)   Wt 72.1 kg (159 lb)   SpO2 96%   BMI 20.98 kg/m   Intake/Output Summary (Last 24 hours) at 09/13/16 2044 Last data filed at 09/13/16 1700  Gross per 24 hour  Intake             1847 ml  Output             1760 ml  Net               87 ml   Weight change:   Physical Exam: [id number] on bedside commode Eyes: anicteric Neck: + JVD CVS: RRR, III/VI holosystolic best heard at LLSB/apex Resp: mildly increased WOB, bibasilar inspiratory crackles 1/3 up lung fields [id number] midline surgical scar Ext: 1+ LE edema SKIN: dry with some excoriations NEURO: + mild asterixis, appears cognitively slowed but answers  appropriately  Imaging: Dg Chest Port 1 View  Result Date: 09/12/2016 CLINICAL DATA:  Respiratory failure. EXAM: PORTABLE CHEST 1 VIEW COMPARISON:  09/11/2016. FINDINGS: Endotracheal tube and NG tube in stable position. Interim partial clearing of right right base infiltrate. Interim i partial resolution right pleural effusion. No pneumothorax stable cardiomegaly. No acute bony abnormality . IMPRESSION: 1. Lines and tubes in stable position. 2. Interim partial clearing of right lower lobe infiltrate and right pleural effusion. Electronically Signed   By: [place]   On: 09/12/2016 07:44    Labs: BMET  Recent Labs Lab 09/09/16 8182 09/09/16 9937 09/09/16 1238 09/10/16 0253  09/11/16 0551  09/11/16 1825 09/11/16 2345 09/12/16 0548 09/12/16 1144 09/12/16 1810 09/13/16 0012 09/13/16 0554  NA 137  --  139 140  --  139  --   --   --  137  --   --   --  132*  K 3.6  --  3.9 3.4*  < > 3.9  < > 3.5 3.9 4.5 4.5 4.6 4.5 4.1  CL 103  --  107 105  --  105  --   --   --  102  --   --   --  97*  CO2 18*  --  18* 19*  --  21*  --   --   --  17*  --   --   --  16*  GLUCOSE 89  --  84 81  --  93  --   --   --  75  --   --   --  86  BUN 109*  --  114* 113*  --  115*  --   --   --  131*  --   --   --  142*  CREATININE 8.35*  --  8.21* 8.29*  --  8.44*  --   --   --  8.85*  --   --   --  8.58*  CALCIUM 7.7*  --  7.5* 7.9*  --  8.1*  --   --   --  7.9*  --   --   --  8.4*  PHOS  --  7.3*  --  6.9*  --  8.4*  --   --   --   --   --   --   --   --   < > = values in this interval not displayed. CBC  Recent Labs Lab 09/10/16 0253 09/11/16 0551 09/12/16 0548 09/13/16 0554  WBC 7.5 7.9 8.8 14.0*  HGB 10.3* 10.9* 10.7* 10.0*  HCT 30.1* 31.2* 31.6* 28.6*  MCV 83.1 83.4 84.7 82.2  PLT 276 275 235 257    Medications:    . amLODipine  10 mg Oral Daily  . calcium acetate  1,334 mg Oral TID WC  . furosemide  160 mg Intravenous Q12H  . heparin subcutaneous  5,000 Units Subcutaneous Q8H  .  Influenza vac split quadrivalent PF  0.5 mL Intramuscular Tomorrow-1000  . insulin aspart  1-3 Units Subcutaneous Q4H  . isosorbide-hydrALAZINE  1 tablet Oral TID  . labetalol  100 mg Oral TID  . LORazepam  0.5 mg Oral BID  . multivitamin with minerals  1 tablet Oral Daily  . pneumococcal 23 valent vaccine  0.5 mL Intramuscular Tomorrow-1000  . tamsulosin  0.4 mg Oral Daily      Madelon Lips MD [facility]  Pager [phone] 09/13/2016, 8:44 PM

## 2016-09-13 NOTE — Progress Notes (Signed)
Dr. Hollie Salk in to see pt. Pt having bladder spasms and urinating around catheter. Dr. Hollie Salk stated ok to remove foley catheter. Catheter removed. Pt states pain is gone. Cont to monitor. Carroll Kinds RN

## 2016-09-13 NOTE — Progress Notes (Signed)
Notified Noe Gens NP of pt c/o of pain with catheter. Asked to call renal to get input on removing catheter. Dr. Jimmy Footman notified. Stated md would see this evening and decided. Cont to monitor pt. Carroll Kinds RN

## 2016-09-13 NOTE — Evaluation (Signed)
Physical Therapy Evaluation Patient Details Name: Timothy [name] MRN: [id number] DOB: 09/30/1961 Today's Date: 09/13/2016   History of Present Illness  55yo male with hx dCHF, colon cancer, uncontrolled HTN, CKD, polysubstance abuse presented 11/25 with SOB, chest pain after taking unknown "pain pill" that he bought off the street.  In ER had HTN with SBP 200's despite nitro gtt, acute on chronic renal failure with Scr 8.5, BNP 1540, metabolic acidosis.  He had waxing and waning mental status and worsening SOB despite bipap and was intubated in ER.  Started on cardene and propofol gtt with slow improvement in BP.   Clinical Impression  Pt indep and working PTA. Pt now with noted impaired co-ordination, global weakness, impaired balance and altered mental status. Pt to benefit from intense rehab program for maximal functional recovery to return home with spouse.    Follow Up Recommendations CIR    Equipment Recommendations   (TBD)    Recommendations for Other Services Rehab consult     Precautions / Restrictions Precautions Precautions: Fall Restrictions Weight Bearing Restrictions: No      Mobility  Bed Mobility               General bed mobility comments: pt up at EOB with RN staff upon PT arrival  Transfers Overall transfer level: Needs assistance Equipment used: Rolling walker (2 wheeled) Transfers: Sit to/from [facility] Sit to Stand: Mod assist Stand pivot transfers: Mod assist;+2 physical assistance       General transfer comment: pt very shaky with waxing and waining forward/backwards requiring modA to maintain balance. pt with ataxic LE steps to chair. 2 person HHA to transfer to chair  Ambulation/Gait Ambulation/Gait assistance: Mod assist;+2 safety/equipment Ambulation Distance (Feet): 10 Feet Assistive device: Rolling walker (2 wheeled) Gait Pattern/deviations: Step-through pattern;Decreased stride length;Ataxic;Narrow base of  support;Trunk flexed Gait velocity: slow Gait velocity interpretation: Below normal speed for age/gender General Gait Details: attempted x2. first time had pt march and pt unable to sequence or clear either foot due to fatigue and weakness. Pt then tried again s/p 5 min. Pt able to amb 10' with RW wit modA to steady pt. pt with near crossover gait pattern, staggering L/R and ant/post  Stairs            Wheelchair Mobility    Modified Rankin (Stroke Patients Only)       Balance Overall balance assessment: Needs assistance Sitting-balance support: Feet supported;Bilateral upper extremity supported Sitting balance-Leahy Scale: Fair Sitting balance - Comments: pt waxing and waining ant/post   Standing balance support: Bilateral upper extremity supported Standing balance-Leahy Scale: Poor Standing balance comment: pt waxing and waining ant/post, weak trunk                             Pertinent Vitals/Pain Pain Assessment: No/denies pain    Home Living Family/patient expects to be discharged to:: Inpatient rehab                 Additional Comments: pt lived at home with wife in 1 story home with 2 steps to enter, no handrail    Prior Function Level of Independence: Independent         Comments: pt was indep and working PTA. wife works during the day, 2nd shift     Hand Dominance   Dominant Hand: Right    Extremity/Trunk Assessment   Upper Extremity Assessment: Generalized weakness  Lower Extremity Assessment: Generalized weakness;RLE deficits/detail;LLE deficits/detail RLE Deficits / Details: grossly 3/5, ataxic LLE Deficits / Details: grossly 3/5, ataxic  Cervical / Trunk Assessment: Normal  Communication   Communication: No difficulties  Cognition Arousal/Alertness: Awake/alert Behavior During Therapy: Flat affect Overall Cognitive Status: Impaired/Different from baseline Area of Impairment: Following  commands;Safety/judgement;Awareness;Problem solving       Following Commands: Follows one step commands with increased time;Follows multi-step commands inconsistently Safety/Judgement: Decreased awareness of safety;Decreased awareness of deficits Awareness: Emergent Problem Solving: Slow processing;Decreased initiation;Difficulty sequencing;Requires verbal cues;Requires tactile cues General Comments: pt slow to respond/process    General Comments      Exercises     Assessment/Plan    PT Assessment Patient needs continued PT services  PT Problem List Decreased strength;Decreased range of motion;Decreased activity tolerance;Decreased balance;Decreased mobility;Decreased coordination;Decreased cognition;Decreased knowledge of use of DME;Decreased safety awareness          PT Treatment Interventions Gait training;Stair training;DME instruction;Functional mobility training;Therapeutic exercise;Therapeutic activities;Balance training;Neuromuscular re-education;Cognitive remediation    PT Goals (Current goals can be found in the Care Plan section)  Acute Rehab PT Goals Patient Stated Goal: to get stronger PT Goal Formulation: With patient Time For Goal Achievement: 09/20/16 Potential to Achieve Goals: Good    Frequency Min 4X/week   Barriers to discharge Decreased caregiver support unsure of support at home    Co-evaluation               End of Session Equipment Utilized During Treatment: Gait belt Activity Tolerance: Patient limited by fatigue Patient left: in chair;with call bell/phone within reach;with chair alarm set Nurse Communication: Mobility status         Time: 0733-0800 PT Time Calculation (min) (ACUTE ONLY): 27 min   Charges:   PT Evaluation $PT Eval Moderate Complexity: 1 Procedure PT Treatments $Gait Training: 8-22 mins   PT G Codes:        Gabbriella Presswood M Juliahna Wiswell 09/13/2016, 8:16 AM   Kittie Plater, PT, DPT Pager #: [phone] Office #:  [phone]

## 2016-09-14 ENCOUNTER — Inpatient Hospital Stay (HOSPITAL_COMMUNITY): Payer: Medicaid Other

## 2016-09-14 ENCOUNTER — Encounter (HOSPITAL_COMMUNITY): Payer: Self-pay | Admitting: Interventional Radiology

## 2016-09-14 DIAGNOSIS — D631 Anemia in chronic kidney disease: Secondary | ICD-10-CM

## 2016-09-14 DIAGNOSIS — N185 Chronic kidney disease, stage 5: Secondary | ICD-10-CM

## 2016-09-14 DIAGNOSIS — N189 Chronic kidney disease, unspecified: Secondary | ICD-10-CM

## 2016-09-14 DIAGNOSIS — N178 Other acute kidney failure: Secondary | ICD-10-CM

## 2016-09-14 DIAGNOSIS — F1910 Other psychoactive substance abuse, uncomplicated: Secondary | ICD-10-CM

## 2016-09-14 DIAGNOSIS — I10 Essential (primary) hypertension: Secondary | ICD-10-CM

## 2016-09-14 DIAGNOSIS — F1110 Opioid abuse, uncomplicated: Secondary | ICD-10-CM

## 2016-09-14 DIAGNOSIS — R4182 Altered mental status, unspecified: Secondary | ICD-10-CM

## 2016-09-14 DIAGNOSIS — I161 Hypertensive emergency: Secondary | ICD-10-CM

## 2016-09-14 HISTORY — PX: IR GENERIC HISTORICAL: IMG1180011

## 2016-09-14 LAB — RENAL FUNCTION PANEL
ALBUMIN: 2.5 g/dL — AB (ref 3.5–5.0)
Anion gap: 23 — ABNORMAL HIGH (ref 5–15)
BUN: 176 mg/dL — AB (ref 6–20)
CALCIUM: 8.7 mg/dL — AB (ref 8.9–10.3)
CHLORIDE: 95 mmol/L — AB (ref 101–111)
CO2: 16 mmol/L — ABNORMAL LOW (ref 22–32)
CREATININE: 10.38 mg/dL — AB (ref 0.61–1.24)
GFR, EST AFRICAN AMERICAN: 6 mL/min — AB (ref >60)
GFR, EST NON AFRICAN AMERICAN: 5 mL/min — AB (ref >60)
Glucose, Bld: 95 mg/dL (ref 65–99)
Phosphorus: 11.5 mg/dL — ABNORMAL HIGH (ref 2.5–4.6)
Potassium: 4.3 mmol/L (ref 3.5–5.1)
SODIUM: 134 mmol/L — AB (ref 135–145)

## 2016-09-14 LAB — GLUCOSE, CAPILLARY
GLUCOSE-CAPILLARY: 64 mg/dL — AB (ref 65–99)
Glucose-Capillary: 75 mg/dL (ref 65–99)
Glucose-Capillary: 71 mg/dL (ref 65–99)
Glucose-Capillary: 68 mg/dL (ref 65–99)
Glucose-Capillary: 83 mg/dL (ref 65–99)

## 2016-09-14 LAB — CULTURE, BLOOD (ROUTINE X 2)
CULTURE: NO GROWTH
CULTURE: NO GROWTH

## 2016-09-14 LAB — BASIC METABOLIC PANEL
ANION GAP: 20 — AB (ref 5–15)
BUN: 161 mg/dL — ABNORMAL HIGH (ref 6–20)
CALCIUM: 8.6 mg/dL — AB (ref 8.9–10.3)
CO2: 17 mmol/L — ABNORMAL LOW (ref 22–32)
Chloride: 96 mmol/L — ABNORMAL LOW (ref 101–111)
Creatinine, Ser: 9.64 mg/dL — ABNORMAL HIGH (ref 0.61–1.24)
GFR, EST AFRICAN AMERICAN: 6 mL/min — AB (ref >60)
GFR, EST NON AFRICAN AMERICAN: 5 mL/min — AB (ref >60)
Glucose, Bld: 61 mg/dL — ABNORMAL LOW (ref 65–99)
Potassium: 4.2 mmol/L (ref 3.5–5.1)
SODIUM: 133 mmol/L — AB (ref 135–145)

## 2016-09-14 LAB — CBC
HCT: 26.8 % — ABNORMAL LOW (ref 39.0–52.0)
HCT: 28.6 % — ABNORMAL LOW (ref 39.0–52.0)
Hemoglobin: 9.5 g/dL — ABNORMAL LOW (ref 13.0–17.0)
Hemoglobin: 10.1 g/dL — ABNORMAL LOW (ref 13.0–17.0)
MCH: 28.8 pg (ref 26.0–34.0)
MCH: 28.5 pg (ref 26.0–34.0)
MCHC: 35.4 g/dL (ref 30.0–36.0)
MCHC: 35.3 g/dL (ref 30.0–36.0)
MCV: 81.2 fL (ref 78.0–100.0)
MCV: 80.8 fL (ref 78.0–100.0)
PLATELETS: 326 10*3/uL (ref 150–400)
PLATELETS: 381 10*3/uL (ref 150–400)
RBC: 3.3 MIL/uL — AB (ref 4.22–5.81)
RBC: 3.54 MIL/uL — ABNORMAL LOW (ref 4.22–5.81)
RDW: 13.3 % (ref 11.5–15.5)
RDW: 13.1 % (ref 11.5–15.5)
WBC: 17 10*3/uL — AB (ref 4.0–10.5)
WBC: 18.2 10*3/uL — AB (ref 4.0–10.5)

## 2016-09-14 LAB — IRON AND TIBC
IRON: 77 ug/dL (ref 45–182)
SATURATION RATIOS: 34 % (ref 17.9–39.5)
TIBC: 225 ug/dL — ABNORMAL LOW (ref 250–450)
UIBC: 148 ug/dL

## 2016-09-14 LAB — PHOSPHORUS: Phosphorus: 11.6 mg/dL — ABNORMAL HIGH (ref 2.5–4.6)

## 2016-09-14 LAB — HIV ANTIBODY (ROUTINE TESTING W REFLEX): HIV SCREEN 4TH GENERATION: NONREACTIVE

## 2016-09-14 MED ORDER — HEPARIN SODIUM (PORCINE) 1000 UNIT/ML IJ SOLN
INTRAMUSCULAR | Status: AC
  Filled 2016-09-14: qty 1

## 2016-09-14 MED ORDER — LIDOCAINE HCL 1 % IJ SOLN
INTRAMUSCULAR | Status: DC | PRN
  Administered 2016-09-14: 5 mL

## 2016-09-14 MED ORDER — ALTEPLASE 2 MG IJ SOLR: 2.0000 mg | Freq: Once | INTRAMUSCULAR | Status: DC | PRN

## 2016-09-14 MED ORDER — DEXTROSE 50 % IV SOLN
INTRAVENOUS | Status: AC
  Administered 2016-09-14: 50 mL
  Filled 2016-09-14: qty 50

## 2016-09-14 MED ORDER — LIDOCAINE HCL (PF) 1 % IJ SOLN: 5.0000 mL | INTRAMUSCULAR | Status: DC | PRN

## 2016-09-14 MED ORDER — PENTAFLUOROPROP-TETRAFLUOROETH EX AERO: 1.0000 "application " | INHALATION_SPRAY | CUTANEOUS | Status: DC | PRN

## 2016-09-14 MED ORDER — GLUCOSE 40 % PO GEL
ORAL | Status: AC
  Administered 2016-09-14: 37.5 g
  Filled 2016-09-14: qty 1

## 2016-09-14 MED ORDER — AMLODIPINE BESYLATE 10 MG PO TABS
10.0000 mg | ORAL_TABLET | Freq: Every day | ORAL | Status: DC
  Administered 2016-09-15 – 2016-09-20 (×6): 10 mg via ORAL
  Filled 2016-09-14 (×6): qty 1

## 2016-09-14 MED ORDER — ACETAMINOPHEN 325 MG PO TABS
650.0000 mg | ORAL_TABLET | ORAL | Status: DC | PRN
  Administered 2016-09-15 – 2016-09-21 (×8): 650 mg via ORAL
  Filled 2016-09-14 (×8): qty 2

## 2016-09-14 MED ORDER — HEPARIN SODIUM (PORCINE) 1000 UNIT/ML DIALYSIS: 1000.0000 [IU] | INTRAMUSCULAR | Status: DC | PRN

## 2016-09-14 MED ORDER — LIDOCAINE-PRILOCAINE 2.5-2.5 % EX CREA: 1.0000 "application " | TOPICAL_CREAM | CUTANEOUS | Status: DC | PRN

## 2016-09-14 MED ORDER — SODIUM CHLORIDE 0.9 % IV SOLN: 100.0000 mL | INTRAVENOUS | Status: DC | PRN

## 2016-09-14 MED ORDER — HEPARIN SODIUM (PORCINE) 1000 UNIT/ML DIALYSIS
40.0000 [IU]/kg | Freq: Once | INTRAMUSCULAR | Status: AC
  Administered 2016-09-14: 2900 [IU] via INTRAVENOUS_CENTRAL

## 2016-09-14 MED ORDER — RENA-VITE PO TABS
1.0000 | ORAL_TABLET | Freq: Every day | ORAL | Status: DC
  Administered 2016-09-14 – 2016-09-20 (×7): 1 via ORAL
  Filled 2016-09-14 (×7): qty 1

## 2016-09-14 MED ORDER — LIDOCAINE HCL 1 % IJ SOLN
INTRAMUSCULAR | Status: AC
  Filled 2016-09-14: qty 20

## 2016-09-14 NOTE — Progress Notes (Signed)
Pt noted to have a morning CBG of 64. Pt NPO for procedure, oral glucose administered. Will recheck. BP peaked at 164/111, 10 mg of IV hydralazine administered. Pt very lethargic but arousable. Multiple incontinent episodes noted throughout the night. Pt will not keep condom cath intact. Pt noted to be very weak and unable to stand and remain steady. Pt requested sleep medicine last night and it was administered as requested. Noted to have some lingering effects. Pt orientation is WDL but can be slow to respond. Will to continue to monitor.

## 2016-09-14 NOTE — Procedures (Signed)
Interventional Radiology Procedure Note  Procedure: Placement of a right IJ approach temp HD cath.  Tip is positioned at the superior cavoatrial junction and catheter is ready for immediate use.  Placement may be borderline for conversion if needed.    Complications: None Recommendations:  - Ok to use - Do not submerge - Routine line care   Signed,  Dulcy Fanny. Earleen Newport, DO

## 2016-09-14 NOTE — Progress Notes (Signed)
PROGRESS NOTE    Timothy [name]  BBC:[id number] DOB: 05/02/1961 DOA: 09/09/2016 PCP: No PCP Per Patient   Chief Complaint  Patient presents with  . Shortness of Breath  . took unknown pill    Brief Narrative:  HPI on 09/09/2016 by Dr. Elza Rafter A de Dios (PCCM)  Assessment & Plan   Hypertensive emergency -Patient required Cardene drip -Currently on amlodipine, labetalol, bidil, as needed hydralazine -Suspect this will improve with dialysis  Acute respiratory failure with hypoxia and pulmonary edema secondary to Acute diastolic heart failure and worsening renal function -Noted to also have been in hypertensive crisis upon admission -Improving -Patient did require intubation -Echocardiogram showed EF of 50-38%  Acute metabolic encephalopathy -Likely multifactorial related to recent unknown medications presumed to be narcotic (responded briefly to Narcan), hypoxemia, uremia, and possible PRES -CT head showed no acute findings -Patient appears to be back to baseline mentation  Elevated troponin -Echocardiogram as above -Likely secondary to demand ischemia in the setting of severe hypertension, respiratory distress and worsening kidney failure  Acute renal failure superimposed on stage 5 chronic kidney disease, progressing -Baseline creatinine in 2016 was 1.5 -Upon admission, creatinine 8.5, BUN of 109 -Currently creatinine 9.64, BUN 161 -Nephrology consulted and appreciated -Patient did receive HD catheter placement today -Suspect patient will need hemodialysis today -Vein mapping ordered  Heroin abuse/Polysubstance abuse -Upon admission, patient was lethargic and unable to provide adequate history regarding current use of hair when and when last used -Urine drug screen positive for opiates and THC -Monitor for withdrawal symptoms  Nonadherence to medical treatment -Unable to address at this juncture due to patient's metabolic encephalopathy and inability to  participate with history  Anemia due to chronic kidney disease -Hemoglobin close to baseline, currently 9.5 -Continue to monitor CBC   Hepatitis C antibody positive -Patient will need outpatient follow-up  Physical deconditioning -PT recommended CIR -Inpatient rehabilitation consulted  DVT Prophylaxis  heparin  Code Status: Full  Family Communication: None at bedside  Disposition Plan: Admitted.   Consultants Nephrology PCCM Interventional radiology PMR  Procedures/ Significant Events 11/25  Admit with hypertensive crisis, acute on chronic renal failure, resp fx requiring intubation.  11/27  Remains on cardene gtt, intermittent agitation  11/24  Echocardiogram: moderate LVH, EF 60-65%, mild MR, patent PFO can not be excluded  11/28  Extubated 11/29  Remains on cardene, tolerated extubation  11/30  Placement of right IJ temp HD cath  Antibiotics   Anti-infectives    Start     Dose/Rate Route Frequency Ordered Stop   09/12/16 1030  levofloxacin (LEVAQUIN) IVPB 500 mg  Status:  Discontinued     500 mg 100 mL/hr over 60 Minutes Intravenous Every 48 hours 09/12/16 0954 09/13/16 1427      Subjective:   [name] seen and examined today.  Patient upset that he cannot eat. States he wants to go home. Has no other complaints today.  Objective:   Vitals:   09/14/16 0549 09/14/16 0716 09/14/16 0720 09/14/16 0806  BP: (!) 164/111 (!) 168/108 (!) 168/108 (!) 165/108  Pulse: 86   82  Resp: 18 19  (!) 21  Temp: 98.5 F (36.9 C)   98.7 F (37.1 C)  TempSrc: Oral   Oral  SpO2:    99%  Weight:      Height:        Intake/Output Summary (Last 24 hours) at 09/14/16 1355 Last data filed at 09/14/16 1315  Gross per 24 hour  Intake  560 ml  Output              425 ml  Net              135 ml   Filed Weights   09/11/16 0600 09/12/16 0400 09/13/16 1552  Weight: 70.1 kg (154 lb 8.7 oz) 70.5 kg (155 lb 6.8 oz) 72.1 kg (159 lb)    Exam  General:  Well developed,thin, NAD, appears stated age  HEENT: NCAT, mucous membranes moist.   Cardiovascular: S1 S2 auscultated, 3/6 SEM, Regular rate and rhythm.  Respiratory: Diminished breath sounds, +rales   Abdomen: Soft, nontender, nondistended, + bowel sounds  Extremities: warm dry without cyanosis clubbing or edema  Neuro: AAOx3, nonfocal  Psych: Agitated    Data Reviewed: I have personally reviewed following labs and imaging studies  CBC:  Recent Labs Lab 09/10/16 0253 09/11/16 0551 09/12/16 0548 09/13/16 0554 09/14/16 0313  WBC 7.5 7.9 8.8 14.0* 17.0*  HGB 10.3* 10.9* 10.7* 10.0* 9.5*  HCT 30.1* 31.2* 31.6* 28.6* 26.8*  MCV 83.1 83.4 84.7 82.2 81.2  PLT 276 275 235 257 222   Basic Metabolic Panel:  Recent Labs Lab 09/09/16 0943  09/10/16 0253  09/11/16 0551  09/12/16 0548 09/12/16 1144 09/12/16 1810 09/13/16 0012 09/13/16 0554 09/14/16 0313  NA  --   < > 140  --  139  --  137  --   --   --  132* 133*  K  --   < > 3.4*  < > 3.9  < > 4.5 4.5 4.6 4.5 4.1 4.2  CL  --   < > 105  --  105  --  102  --   --   --  97* 96*  CO2  --   < > 19*  --  21*  --  17*  --   --   --  16* 17*  GLUCOSE  --   < > 81  --  93  --  75  --   --   --  86 61*  BUN  --   < > 113*  --  115*  --  131*  --   --   --  142* 161*  CREATININE  --   < > 8.29*  --  8.44*  --  8.85*  --   --   --  8.58* 9.64*  CALCIUM  --   < > 7.9*  --  8.1*  --  7.9*  --   --   --  8.4* 8.6*  MG 2.3  --  2.1  --  2.2  --   --   --   --   --   --   --   PHOS 7.3*  --  6.9*  --  8.4*  --   --   --   --   --   --  11.6*  < > = values in this interval not displayed. GFR: Estimated Creatinine Clearance: 8.8 mL/min (by C-G formula based on SCr of 9.64 mg/dL (H)). Liver Function Tests:  Recent Labs Lab 09/09/16 0509 09/10/16 0253  AST 45* 34  ALT 34 30  ALKPHOS 63 59  BILITOT 0.3 0.3  PROT 6.2* 5.8*  ALBUMIN 3.1* 2.8*    Recent Labs Lab 09/09/16 0943  LIPASE 72*  AMYLASE 233*    Recent Labs Lab  09/09/16 0943  AMMONIA 23   Coagulation Profile: No results for input(s): INR, PROTIME  in the last 168 hours. Cardiac Enzymes:  Recent Labs Lab 09/09/16 0537 09/09/16 0943 09/09/16 1439 09/09/16 2123  TROPONINI 0.42* 0.44* 0.41* 0.33*   BNP (last 3 results) No results for input(s): PROBNP in the last 8760 hours. HbA1C: No results for input(s): HGBA1C in the last 72 hours. CBG:  Recent Labs Lab 09/13/16 2215 09/14/16 0107 09/14/16 0512 09/14/16 0656 09/14/16 0742  GLUCAP 67 75 64* 71 68   Lipid Profile:  Recent Labs  09/12/16 0821  TRIG 81   Thyroid Function Tests: No results for input(s): TSH, T4TOTAL, FREET4, T3FREE, THYROIDAB in the last 72 hours. Anemia Panel: No results for input(s): VITAMINB12, FOLATE, FERRITIN, TIBC, IRON, RETICCTPCT in the last 72 hours. Urine analysis:    Component Value Date/Time   COLORURINE YELLOW 09/13/2016 1100   APPEARANCEUR CLEAR 09/13/2016 1100   LABSPEC 1.011 09/13/2016 1100   PHURINE 5.0 09/13/2016 1100   GLUCOSEU NEGATIVE 09/13/2016 1100   HGBUR MODERATE (A) 09/13/2016 1100   BILIRUBINUR NEGATIVE 09/13/2016 1100   KETONESUR NEGATIVE 09/13/2016 1100   PROTEINUR 100 (A) 09/13/2016 1100   NITRITE NEGATIVE 09/13/2016 1100   LEUKOCYTESUR NEGATIVE 09/13/2016 1100   Sepsis Labs: @LABRCNTIP (procalcitonin:4,lacticidven:4)  ) Recent Results (from the past 240 hour(s))  Culture, blood (routine x 2)     Status: None (Preliminary result)   Collection Time: 09/09/16  9:55 AM  Result Value Ref Range Status   Specimen Description BLOOD RIGHT HAND  Final   Special Requests BOTTLES DRAWN AEROBIC AND ANAEROBIC  10CC  Final   Culture NO GROWTH 4 DAYS  Final   Report Status PENDING  Incomplete  Culture, blood (routine x 2)     Status: None (Preliminary result)   Collection Time: 09/09/16  9:58 AM  Result Value Ref Range Status   Specimen Description BLOOD LEFT HAND  Final   Special Requests BOTTLES DRAWN AEROBIC AND ANAEROBIC   5CC  Final   Culture NO GROWTH 4 DAYS  Final   Report Status PENDING  Incomplete  MRSA PCR Screening     Status: None   Collection Time: 09/09/16 10:52 AM  Result Value Ref Range Status   MRSA by PCR NEGATIVE NEGATIVE Final    Comment:        The GeneXpert MRSA Assay (FDA approved for NASAL specimens only), is one component of a comprehensive MRSA colonization surveillance program. It is not intended to diagnose MRSA infection nor to guide or monitor treatment for MRSA infections.       Radiology Studies: Ir Fluoro Guide Cv Line Right  Result Date: 09/14/2016 INDICATION: 55 year old male with a history of acute kidney insufficiency superimposed on chronic kidney disease. EXAM: TUNNELED CENTRAL VENOUS HEMODIALYSIS CATHETER PLACEMENT WITH ULTRASOUND AND FLUOROSCOPIC GUIDANCE MEDICATIONS: None ANESTHESIA/SEDATION: None FLUOROSCOPY TIME:  Fluoroscopy Time: 0 minutes 6 seconds (1 mGy). COMPLICATIONS: None PROCEDURE: Informed written consent was obtained from the patient and the patient's family after a thorough discussion of the procedural risks, benefits and alternatives. All questions were addressed. A timeout was performed prior to the initiation of the procedure. The right neck and chest was prepped with chlorhexidine, and draped in the usual sterile fashion using maximum barrier technique (cap and mask, sterile gown, sterile gloves, large sterile sheet, hand hygiene and cutaneous antiseptic). Local anesthesia was attained by infiltration with 1% lidocaine without epinephrine. Ultrasound demonstrated patency of the right internal jugular vein, and this was documented with an image. Under real-time ultrasound guidance, this vein was accessed with a 21 gauge  micropuncture needle and image documentation was performed. A small dermatotomy was made at the access site with an 11 scalpel. A 0.018" wire was advanced into the SVC and the access needle exchanged for a 105F micropuncture vascular sheath.  The 0.018" wire was then removed and a 0.035" wire advanced into the IVC. Upon withdrawal of the 018 wire, the wire was marked for appropriate length of the internal portion of the catheter. A 19 cm catheter was selected. Skin and subcutaneous tissues were serially dilated. Catheter was placed on the wire. The catheter tip is positioned in the upper right atrium. This was documented with a spot image. Both ports of the hemodialysis catheter were then tested for excellent function. The ports were then locked with heparinized lock. Patient tolerated the procedure well and remained hemodynamically stable throughout. No complications were encountered and no significant blood loss was encountered. IMPRESSION: Status post right IJ approach temporary HD catheter placement. Catheter ready for use. Signed, Dulcy Fanny. Earleen Newport, DO [facility] Radiology Electronically Signed   By: [name] D.O.   On: 09/14/2016 12:49   Ir US Guide Vasc Access Right  Result Date: 09/14/2016 INDICATION: 55 year old male with a history of acute kidney insufficiency superimposed on chronic kidney disease. EXAM: TUNNELED CENTRAL VENOUS HEMODIALYSIS CATHETER PLACEMENT WITH ULTRASOUND AND FLUOROSCOPIC GUIDANCE MEDICATIONS: None ANESTHESIA/SEDATION: None FLUOROSCOPY TIME:  Fluoroscopy Time: 0 minutes 6 seconds (1 mGy). COMPLICATIONS: None PROCEDURE: Informed written consent was obtained from the patient and the patient's family after a thorough discussion of the procedural risks, benefits and alternatives. All questions were addressed. A timeout was performed prior to the initiation of the procedure. The right neck and chest was prepped with chlorhexidine, and draped in the usual sterile fashion using maximum barrier technique (cap and mask, sterile gown, sterile gloves, large sterile sheet, hand hygiene and cutaneous antiseptic). Local anesthesia was attained by infiltration with 1% lidocaine  without epinephrine. Ultrasound demonstrated patency of the right internal jugular vein, and this was documented with an image. Under real-time ultrasound guidance, this vein was accessed with a 21 gauge micropuncture needle and image documentation was performed. A small dermatotomy was made at the access site with an 11 scalpel. A 0.018" wire was advanced into the SVC and the access needle exchanged for a 105F micropuncture vascular sheath. The 0.018" wire was then removed and a 0.035" wire advanced into the IVC. Upon withdrawal of the 018 wire, the wire was marked for appropriate length of the internal portion of the catheter. A 19 cm catheter was selected. Skin and subcutaneous tissues were serially dilated. Catheter was placed on the wire. The catheter tip is positioned in the upper right atrium. This was documented with a spot image. Both ports of the hemodialysis catheter were then tested for excellent function. The ports were then locked with heparinized lock. Patient tolerated the procedure well and remained hemodynamically stable throughout. No complications were encountered and no significant blood loss was encountered. IMPRESSION: Status post right IJ approach temporary HD catheter placement. Catheter ready for use. Signed, Dulcy Fanny. Earleen Newport, DO [facility] Radiology Electronically Signed   By: [name] D.O.   On: 09/14/2016 12:49     Scheduled Meds: . [START ON 09/15/2016] amLODipine  10 mg Oral QHS  . calcium acetate  1,334 mg Oral TID WC  . heparin      . heparin subcutaneous  5,000 Units Subcutaneous Q8H  . Influenza vac split quadrivalent PF  0.5 mL Intramuscular Tomorrow-1000  . isosorbide-hydrALAZINE  1 tablet Oral TID  . labetalol  100 mg Oral TID  . lidocaine      . LORazepam  0.5 mg Oral BID  . multivitamin  1 tablet Oral QHS  . pneumococcal 23 valent vaccine  0.5 mL Intramuscular Tomorrow-1000  . tamsulosin  0.4 mg Oral Daily  .  tuberculin  5 Units Intradermal Once   Continuous Infusions:   LOS: 5 days   Time Spent in minutes   30 minutes  Marjan Rosman D.O. on 09/14/2016 at 1:55 PM  Between 7am to 7pm - Pager - [phone]  After 7pm go to [email or web address] - password [email or web address]  And look for the night coverage person covering for me after hours  [facility] Office  [phone]

## 2016-09-14 NOTE — Progress Notes (Signed)
Dialysis treatment completed.  3000 mL ultrafiltrated.  2500 mL net fluid removal.  Patient status unchanged. Lung sounds diminished to ausculation in all fields. Generalized edema. Cardiac: NSR.  Cleansed RIJ catheter with chlorhexidine.  Disconnected lines and flushed ports with saline per protocol.  Ports locked with heparin and capped per protocol.    Report given to bedside, RN Christy.

## 2016-09-14 NOTE — Progress Notes (Signed)
Rehab admissions - I am following for potential acute inpatient rehab admission once patient is medically ready.  Note temp HD cath placed today.  Noted agitation.  Call me for questions.  #[phone]

## 2016-09-14 NOTE — Progress Notes (Signed)
OT Cancellation Note  Patient Details Name: Timothy [name] MRN: [id number] DOB: 1961-03-23   Cancelled Treatment:    Reason Eval/Treat Not Completed: Other (comment) (increased agitation; RN requesting hold on therapy). Will follow up for OT eval as time allows.  Binnie Kand M.S., OTR/L Pager: [phone]  09/14/2016, 9:37 AM

## 2016-09-14 NOTE — Progress Notes (Signed)
Received patient from [facility] dialysis to 3W33. Pt a&o x3. No complaints at this time other than stating "i want to go home now". Vitals stable,  BP 180/117, prn hydralazine to be given. Resuming care of this patient at this time. Barbie Haggis, RN.

## 2016-09-14 NOTE — Progress Notes (Signed)
PT Cancellation Note  Patient Details Name: Timothy [name] MRN: [id number] DOB: 14-Dec-1960   Cancelled Treatment:    Reason Eval/Treat Not Completed: Other (comment) increased agitation; RN requesting hold on therapy). Will follow up next available time.   Marguarite Arbour A Latarsha Zani 09/14/2016, 1:18 PM Wray Kearns, [place], DPT [phone]

## 2016-09-14 NOTE — Progress Notes (Signed)
Patient arrived to unit by bed.  Reviewed treatment plan and this RN agrees with plan.  Report received from bedside RN, Alyse Low.  Consent obtained.  Patient A & O X 3.   Lung sounds diminished to ausculation in all fields. Generalized edema. Cardiac:  NSR.  Removed caps and cleansed RIJ catheter with chlorhedxidine.  Aspirated ports of heparin and flushed them with saline per protocol.  Connected and secured lines, initiated treatment at 1609.  UF Goal of 307mL and net fluid removal 2.5L.  Will continue to monitor.

## 2016-09-14 NOTE — Progress Notes (Signed)
Subjective: Interval History: has complaints wants to get out of here.  Objective: Vital signs in last 24 hours: Temp:  [98.5 F (36.9 C)-98.7 F (37.1 C)] 98.7 F (37.1 C) (11/30 0806) Pulse Rate:  [76-86] 82 (11/30 0806) Resp:  [15-21] 21 (11/30 0806) BP: (110-173)/(70-111) 165/108 (11/30 0806) SpO2:  [94 %-99 %] 99 % (11/30 0806) Weight:  [72.1 kg (159 lb)] 72.1 kg (159 lb) (11/29 1552) Weight change:   Intake/Output from previous day: 11/29 0701 - 11/30 0700 In: 926.7 [P.O.:360; I.V.:366.7] Out: 750 [Urine:750] Intake/Output this shift: Total I/O In: 240 [P.O.:240] Out: 300 [Urine:300]  General appearance: alert, cachectic and not coop Neck: RIJ cath Resp: diminished breath sounds bibasilar and rales bibasilar Cardio: S1, S2 normal and systolic murmur: holosystolic 3/6, blowing at apex GI: pos bs, liver down 5 cm Extremities: extremities normal, atraumatic, no cyanosis or edema  Lab Results:  Recent Labs  09/13/16 0554 09/14/16 0313  WBC 14.0* 17.0*  HGB 10.0* 9.5*  HCT 28.6* 26.8*  PLT 257 326   BMET:  Recent Labs  09/13/16 0554 09/14/16 0313  NA 132* 133*  K 4.1 4.2  CL 97* 96*  CO2 16* 17*  GLUCOSE 86 61*  BUN 142* 161*  CREATININE 8.58* 9.64*  CALCIUM 8.4* 8.6*   No results for input(s): PTH in the last 72 hours. Iron Studies: No results for input(s): IRON, TIBC, TRANSFERRIN, FERRITIN in the last 72 hours.  Studies/Results: Ir Fluoro Guide Cv Line Right  Result Date: 09/14/2016 INDICATION: 55 year old male with a history of acute kidney insufficiency superimposed on chronic kidney disease. EXAM: TUNNELED CENTRAL VENOUS HEMODIALYSIS CATHETER PLACEMENT WITH ULTRASOUND AND FLUOROSCOPIC GUIDANCE MEDICATIONS: None ANESTHESIA/SEDATION: None FLUOROSCOPY TIME:  Fluoroscopy Time: 0 minutes 6 seconds (1 mGy). COMPLICATIONS: None PROCEDURE: Informed written consent was obtained from the patient and the patient's family after a thorough discussion of the  procedural risks, benefits and alternatives. All questions were addressed. A timeout was performed prior to the initiation of the procedure. The right neck and chest was prepped with chlorhexidine, and draped in the usual sterile fashion using maximum barrier technique (cap and mask, sterile gown, sterile gloves, large sterile sheet, hand hygiene and cutaneous antiseptic). Local anesthesia was attained by infiltration with 1% lidocaine without epinephrine. Ultrasound demonstrated patency of the right internal jugular vein, and this was documented with an image. Under real-time ultrasound guidance, this vein was accessed with a 21 gauge micropuncture needle and image documentation was performed. A small dermatotomy was made at the access site with an 11 scalpel. A 0.018" wire was advanced into the SVC and the access needle exchanged for a 31F micropuncture vascular sheath. The 0.018" wire was then removed and a 0.035" wire advanced into the IVC. Upon withdrawal of the 018 wire, the wire was marked for appropriate length of the internal portion of the catheter. A 19 cm catheter was selected. Skin and subcutaneous tissues were serially dilated. Catheter was placed on the wire. The catheter tip is positioned in the upper right atrium. This was documented with a spot image. Both ports of the hemodialysis catheter were then tested for excellent function. The ports were then locked with heparinized lock. Patient tolerated the procedure well and remained hemodynamically stable throughout. No complications were encountered and no significant blood loss was encountered. IMPRESSION: Status post right IJ approach temporary HD catheter placement. Catheter ready for use. Signed, Dulcy Fanny. Earleen Newport, DO [facility] Radiology Electronically Signed   By: York Cerise  Earleen Newport D.O.   On: 09/14/2016 12:49   Ir US Guide Vasc Access Right  Result Date: 09/14/2016 INDICATION: 56 year old male with  a history of acute kidney insufficiency superimposed on chronic kidney disease. EXAM: TUNNELED CENTRAL VENOUS HEMODIALYSIS CATHETER PLACEMENT WITH ULTRASOUND AND FLUOROSCOPIC GUIDANCE MEDICATIONS: None ANESTHESIA/SEDATION: None FLUOROSCOPY TIME:  Fluoroscopy Time: 0 minutes 6 seconds (1 mGy). COMPLICATIONS: None PROCEDURE: Informed written consent was obtained from the patient and the patient's family after a thorough discussion of the procedural risks, benefits and alternatives. All questions were addressed. A timeout was performed prior to the initiation of the procedure. The right neck and chest was prepped with chlorhexidine, and draped in the usual sterile fashion using maximum barrier technique (cap and mask, sterile gown, sterile gloves, large sterile sheet, hand hygiene and cutaneous antiseptic). Local anesthesia was attained by infiltration with 1% lidocaine without epinephrine. Ultrasound demonstrated patency of the right internal jugular vein, and this was documented with an image. Under real-time ultrasound guidance, this vein was accessed with a 21 gauge micropuncture needle and image documentation was performed. A small dermatotomy was made at the access site with an 11 scalpel. A 0.018" wire was advanced into the SVC and the access needle exchanged for a 32F micropuncture vascular sheath. The 0.018" wire was then removed and a 0.035" wire advanced into the IVC. Upon withdrawal of the 018 wire, the wire was marked for appropriate length of the internal portion of the catheter. A 19 cm catheter was selected. Skin and subcutaneous tissues were serially dilated. Catheter was placed on the wire. The catheter tip is positioned in the upper right atrium. This was documented with a spot image. Both ports of the hemodialysis catheter were then tested for excellent function. The ports were then locked with heparinized lock. Patient tolerated the procedure well and remained hemodynamically stable throughout. No  complications were encountered and no significant blood loss was encountered. IMPRESSION: Status post right IJ approach temporary HD catheter placement. Catheter ready for use. Signed, Dulcy Fanny. Earleen Newport, DO [facility] Radiology Electronically Signed   By: [name] D.O.   On: 09/14/2016 12:49    I have reviewed the patient's current medications.  Assessment/Plan: 1 CKD 5 needs HD, will setup, has temp cath 2 ^ WBC per primary 3 HTN lower vol , cont meds 4 substance abuse 5 anemia check Fe 6 hPTH check PTH P HD, check Fe, PTH. bp meds    LOS: 5 days   Zaine Elsass L 09/14/2016,1:16 PM

## 2016-09-14 NOTE — Consult Note (Signed)
CONSULT NOTE   MRN : [id number]  Reason for Consult: ESRD Referring Physician: Dr. Jimmy Footman  History of Present Illness: 55 y/o male with CKD presented 11/25 with SOB and chest pain after taking unknown "pain pill" that he bought off the street.  In ER had HTN with SBP 200's despite nitro gtt.  Pt was admitted with Acute on chronic renal disease and hypertensive emergency.  Pt is right handed.  He denies any prior neck catheters and has never had PPM.  Past medical history includes: HF, colon cancer, uncontrolled HTN, CKD, polysubstance abuse    Current Facility-Administered Medications  Medication Dose Route Frequency Provider Last Rate Last Dose  . 0.9 %  sodium chloride infusion  250 mL Intravenous PRN Rush Landmark, MD   Stopped at 09/11/16 2200  . amLODipine (NORVASC) tablet 10 mg  10 mg Oral Daily Donita Brooks, NP   10 mg at 09/14/16 1001  . calcium acetate (PHOSLO) capsule 1,334 mg  1,334 mg Oral TID WC Estanislado Emms, MD   1,334 mg at 09/13/16 1153  . furosemide (LASIX) 160 mg in dextrose 5 % 50 mL IVPB  160 mg Intravenous Q12H Donita Brooks, NP   160 mg at 09/13/16 2259  . heparin injection 5,000 Units  5,000 Units Subcutaneous Q8H Marijean Heath, NP   Stopped at 09/14/16 0537  . hydrALAZINE (APRESOLINE) injection 10-40 mg  10-40 mg Intravenous Q4H PRN Rigoberto Noel, MD   10 mg at 09/14/16 0720  . Influenza vac split quadrivalent PF (FLUARIX) injection 0.5 mL  0.5 mL Intramuscular Tomorrow-1000 Collene Gobble, MD      . ipratropium-albuterol (DUONEB) 0.5-2.5 (3) MG/3ML nebulizer solution 3 mL  3 mL Nebulization Q3H PRN Raylene Miyamoto, MD      . isosorbide-hydrALAZINE (BIDIL) 20-37.5 MG per tablet 1 tablet  1 tablet Oral TID Donita Brooks, NP   1 tablet at 09/14/16 1001  . labetalol (NORMODYNE) tablet 100 mg  100 mg Oral TID Donita Brooks, NP   100 mg at 09/14/16 1001  . LORazepam (ATIVAN) tablet 0.5 mg  0.5 mg Oral BID Raylene Miyamoto, MD   0.5 mg at  09/14/16 1001  . multivitamin with minerals tablet 1 tablet  1 tablet Oral Daily Donita Brooks, NP   1 tablet at 09/13/16 0932  . pneumococcal 23 valent vaccine (PNU-IMMUNE) injection 0.5 mL  0.5 mL Intramuscular Tomorrow-1000 Collene Gobble, MD      . tamsulosin [facility]) capsule 0.4 mg  0.4 mg Oral Daily Donita Brooks, NP   0.4 mg at 09/14/16 1001  . traZODone (DESYREL) tablet 50 mg  50 mg Oral QHS PRN Donita Brooks, NP      . tuberculin injection 5 Units  5 Units Intradermal Once Madelon Lips, MD   5 Units at 09/13/16 2140  . zolpidem (AMBIEN) tablet 5 mg  5 mg Oral QHS PRN Raylene Miyamoto, MD   5 mg at 09/13/16 2258    Pt meds include: Statin :Yes Betablocker: Yes ASA: No Other anticoagulants/antiplatelets:   Past Medical History:  Diagnosis Date  . Acute diastolic heart failure ([place]) 07/17/2016  . BPH (benign prostatic hyperplasia)   . Colon cancer ([place]) 2014  . Elevated troponin   . Hypertension     Past Surgical History:  Procedure Laterality Date  . ABDOMINAL SURGERY    . COLON SURGERY  2014    Social History Social  History  Substance Use Topics  . Smoking status: Current Every Day Smoker    Packs/day: 0.25    Types: Cigarettes  . Smokeless tobacco: Never Used  . Alcohol use 4.2 oz/week    7 Cans of beer per week     Comment: drinks daily 1-2 beers    Family History Family History  Problem Relation Age of Onset  . Heart disease Mother     Died at age 54.  Marland Kitchen Heart failure Mother   . Kidney failure Sister     No Known Allergies   REVIEW OF SYSTEMS  General: [ ]  Weight loss, [ ]  Fever, [ ]  chills Neurologic: [ ]  Dizziness, [ ]  Blackouts, [ ]  Seizure [ ]  Stroke, [ ]  "Mini stroke", [ ]  Slurred speech, [ ]  Temporary blindness; [ ]  weakness in arms or legs, [ ]  Hoarseness [ ]  Dysphagia Cardiac: [ ]  Chest pain/pressure, [x ] Shortness of breath at rest [x ] Shortness of breath with exertion, [ ]  Atrial fibrillation or irregular heartbeat  Vascular: [  ] Pain in legs with walking, [ ]  Pain in legs at rest, [ ]  Pain in legs at night,  [ ]  Non-healing ulcer, [ ]  Blood clot in vein/DVT,   Pulmonary: [ ]  Home oxygen, [x ] Productive cough, [ ]  Coughing up blood, [ ]  Asthma,  [ ]  Wheezing [ ]  COPD Musculoskeletal:  [ ]  Arthritis, [ ]  Low back pain, [ ]  Joint pain Hematologic: [ ]  Easy Bruising, [ ]  Anemia; [ ]  Hepatitis Gastrointestinal: [ ]  Blood in stool, [ ]  Gastroesophageal Reflux/heartburn, Urinary: [x ] chronic Kidney disease, [ ]  on HD - [ ]  MWF or [ ]  TTHS, [ ]  Burning with urination, [ ]  Difficulty urinating Skin: [ ]  Rashes, [ ]  Wounds Psychological: [ ]  Anxiety, [ ]  Depression  Physical Examination Vitals:   09/14/16 0549 09/14/16 0716 09/14/16 0720 09/14/16 0806  BP: (!) 164/111 (!) 168/108 (!) 168/108 (!) 165/108  Pulse: 86   82  Resp: 18 19  (!) 21  Temp: 98.5 F (36.9 C)   98.7 F (37.1 C)  TempSrc: Oral   Oral  SpO2:    99%  Weight:      Height:       Body mass index is 20.98 kg/m.  General:  WDWN in NAD HENT: WNL Eyes: Pupils equal Pulmonary: non-labored breathing  Cardiac: RRR, without  Murmurs, rubs or gallops; No carotid bruits  Abdomen: soft, NT, no masses Skin: no rashes, ulcers noted;  no Gangrene , no cellulitis; no open wounds;  Vascular Exam/Pulses:palpable radial pulses bilaterally Musculoskeletal: no muscle wasting or atrophy; no edema  Neurologic: A&O X 3; Appropriate Affect ; SENSATION: normal; MOTOR FUNCTION: 5/5 Symmetric; Speech is fluent/normal; Gait: Normal Psychiatric: Judgment intact, Mood & affect appropriate for pt's clinical situation Lymph : No Cervical or Axillary lymphadenopathy   Significant Diagnostic Studies: CBC Lab Results  Component Value Date   WBC 17.0 (H) 09/14/2016   HGB 9.5 (L) 09/14/2016   HCT 26.8 (L) 09/14/2016   MCV 81.2 09/14/2016   PLT 326 09/14/2016    BMET    Component Value Date/Time   NA 133 (L) 09/14/2016 0313   K 4.2 09/14/2016 0313   CL 96 (L)  09/14/2016 0313   CO2 17 (L) 09/14/2016 0313   GLUCOSE 61 (L) 09/14/2016 0313   BUN 161 (H) 09/14/2016 0313   CREATININE 9.64 (H) 09/14/2016 0313   CREATININE 1.59 (H) 07/29/2015 3536  CALCIUM 8.6 (L) 09/14/2016 0313   GFRNONAA 5 (L) 09/14/2016 0313   GFRNONAA 48 (L) 07/29/2015 0943   GFRAA 6 (L) 09/14/2016 0313   GFRAA 56 (L) 07/29/2015 0943   Estimated Creatinine Clearance: 8.8 mL/min (by C-G formula based on SCr of 9.64 mg/dL (H)).  COAG No results found for: INR, PROTIME  Radiology: Ir Fluoro Guide Cv Line Right  Result Date: 09/14/2016 INDICATION: 55 year old male with a history of acute kidney insufficiency superimposed on chronic kidney disease. EXAM: TUNNELED CENTRAL VENOUS HEMODIALYSIS CATHETER PLACEMENT WITH ULTRASOUND AND FLUOROSCOPIC GUIDANCE MEDICATIONS: None ANESTHESIA/SEDATION: None FLUOROSCOPY TIME:  Fluoroscopy Time: 0 minutes 6 seconds (1 mGy). COMPLICATIONS: None PROCEDURE: Informed written consent was obtained from the patient and the patient's family after a thorough discussion of the procedural risks, benefits and alternatives. All questions were addressed. A timeout was performed prior to the initiation of the procedure. The right neck and chest was prepped with chlorhexidine, and draped in the usual sterile fashion using maximum barrier technique (cap and mask, sterile gown, sterile gloves, large sterile sheet, hand hygiene and cutaneous antiseptic). Local anesthesia was attained by infiltration with 1% lidocaine without epinephrine. Ultrasound demonstrated patency of the right internal jugular vein, and this was documented with an image. Under real-time ultrasound guidance, this vein was accessed with a 21 gauge micropuncture needle and image documentation was performed. A small dermatotomy was made at the access site with an 11 scalpel. A 0.018" wire was advanced into the SVC and the access needle exchanged for a 74F micropuncture vascular sheath. The 0.018" wire was  then removed and a 0.035" wire advanced into the IVC. Upon withdrawal of the 018 wire, the wire was marked for appropriate length of the internal portion of the catheter. A 19 cm catheter was selected. Skin and subcutaneous tissues were serially dilated. Catheter was placed on the wire. The catheter tip is positioned in the upper right atrium. This was documented with a spot image. Both ports of the hemodialysis catheter were then tested for excellent function. The ports were then locked with heparinized lock. Patient tolerated the procedure well and remained hemodynamically stable throughout. No complications were encountered and no significant blood loss was encountered. IMPRESSION: Status post right IJ approach temporary HD catheter placement. Catheter ready for use. Signed, Dulcy Fanny. Earleen Newport, DO [facility] Radiology Electronically Signed   By: [name] D.O.   On: 09/14/2016 12:49   Ir US Guide Vasc Access Right  Result Date: 09/14/2016 INDICATION: 55 year old male with a history of acute kidney insufficiency superimposed on chronic kidney disease. EXAM: TUNNELED CENTRAL VENOUS HEMODIALYSIS CATHETER PLACEMENT WITH ULTRASOUND AND FLUOROSCOPIC GUIDANCE MEDICATIONS: None ANESTHESIA/SEDATION: None FLUOROSCOPY TIME:  Fluoroscopy Time: 0 minutes 6 seconds (1 mGy). COMPLICATIONS: None PROCEDURE: Informed written consent was obtained from the patient and the patient's family after a thorough discussion of the procedural risks, benefits and alternatives. All questions were addressed. A timeout was performed prior to the initiation of the procedure. The right neck and chest was prepped with chlorhexidine, and draped in the usual sterile fashion using maximum barrier technique (cap and mask, sterile gown, sterile gloves, large sterile sheet, hand hygiene and cutaneous antiseptic). Local anesthesia was attained by infiltration with 1% lidocaine without epinephrine.  Ultrasound demonstrated patency of the right internal jugular vein, and this was documented with an image. Under real-time ultrasound guidance, this vein was accessed with a 21 gauge micropuncture needle and image documentation was performed. A small dermatotomy was made  at the access site with an 11 scalpel. A 0.018" wire was advanced into the SVC and the access needle exchanged for a 96F micropuncture vascular sheath. The 0.018" wire was then removed and a 0.035" wire advanced into the IVC. Upon withdrawal of the 018 wire, the wire was marked for appropriate length of the internal portion of the catheter. A 19 cm catheter was selected. Skin and subcutaneous tissues were serially dilated. Catheter was placed on the wire. The catheter tip is positioned in the upper right atrium. This was documented with a spot image. Both ports of the hemodialysis catheter were then tested for excellent function. The ports were then locked with heparinized lock. Patient tolerated the procedure well and remained hemodynamically stable throughout. No complications were encountered and no significant blood loss was encountered. IMPRESSION: Status post right IJ approach temporary HD catheter placement. Catheter ready for use. Signed, Dulcy Fanny. Earleen Newport, DO [facility] Radiology Electronically Signed   By: [name] D.O.   On: 09/14/2016 12:49     Non-Invasive Vascular Imaging: Vein mapping ordered  ASSESSMENT/PLAN:  AKD on CKD vein mapping pending He is right handed Current plan for IR to place temp cath today   Laurence Slate [facility] 09/14/2016 10:39 AM  Addendum  I have independently interviewed and examined the patient, and I agree with the physician assistant's findings.  Pt will need conversion of RIJV temporary to [facility] and permanent access placement.  - Pt's interaction was limited, so I'm not certain if he has AMS from his uremia or his baseline is low  function. - I don't think he has any understanding of HD or permanent access at this time. - Vein mapping pending. - Would wait until next week before considering TDC placement and L arm fistula vs AVG placement as I don't the patient can consent currently.  Adele Barthel, MD, FACS [facility] [place] Office: [phone] Pager: [phone]  09/14/2016, 3:42 PM

## 2016-09-15 ENCOUNTER — Inpatient Hospital Stay (HOSPITAL_COMMUNITY): Payer: Medicaid Other

## 2016-09-15 DIAGNOSIS — E871 Hypo-osmolality and hyponatremia: Secondary | ICD-10-CM

## 2016-09-15 DIAGNOSIS — N186 End stage renal disease: Secondary | ICD-10-CM

## 2016-09-15 LAB — GLUCOSE, CAPILLARY: GLUCOSE-CAPILLARY: 94 mg/dL (ref 65–99)

## 2016-09-15 LAB — CBC
HCT: 31.5 % — ABNORMAL LOW (ref 39.0–52.0)
Hemoglobin: 11.2 g/dL — ABNORMAL LOW (ref 13.0–17.0)
MCH: 28.6 pg (ref 26.0–34.0)
MCHC: 35.6 g/dL (ref 30.0–36.0)
MCV: 80.6 fL (ref 78.0–100.0)
PLATELETS: 399 10*3/uL (ref 150–400)
RBC: 3.91 MIL/uL — AB (ref 4.22–5.81)
RDW: 12.9 % (ref 11.5–15.5)
WBC: 13.6 10*3/uL — ABNORMAL HIGH (ref 4.0–10.5)

## 2016-09-15 LAB — PTH, INTACT AND CALCIUM
CALCIUM TOTAL (PTH): 8.4 mg/dL — AB (ref 8.7–10.2)
PTH: 241 pg/mL — AB (ref 15–65)

## 2016-09-15 LAB — BASIC METABOLIC PANEL
Anion gap: 18 — ABNORMAL HIGH (ref 5–15)
BUN: 108 mg/dL — AB (ref 6–20)
CALCIUM: 8.7 mg/dL — AB (ref 8.9–10.3)
CO2: 20 mmol/L — ABNORMAL LOW (ref 22–32)
CREATININE: 7.73 mg/dL — AB (ref 0.61–1.24)
Chloride: 95 mmol/L — ABNORMAL LOW (ref 101–111)
GFR calc Af Amer: 8 mL/min — ABNORMAL LOW (ref >60)
GFR, EST NON AFRICAN AMERICAN: 7 mL/min — AB (ref >60)
GLUCOSE: 82 mg/dL (ref 65–99)
Potassium: 3.8 mmol/L (ref 3.5–5.1)
SODIUM: 133 mmol/L — AB (ref 135–145)

## 2016-09-15 LAB — PARATHYROID HORMONE, INTACT (NO CA): PTH: 156 pg/mL — AB (ref 15–65)

## 2016-09-15 MED ORDER — CALCITRIOL 0.5 MCG PO CAPS
0.5000 ug | ORAL_CAPSULE | ORAL | Status: DC
  Administered 2016-09-15 – 2016-09-21 (×3): 0.5 ug via ORAL
  Filled 2016-09-15 (×3): qty 1

## 2016-09-15 MED ORDER — HYDRALAZINE HCL 20 MG/ML IJ SOLN
INTRAMUSCULAR | Status: AC
  Administered 2016-09-16: 10 mg via INTRAVENOUS
  Filled 2016-09-15: qty 2

## 2016-09-15 MED ORDER — NEPRO/CARBSTEADY PO LIQD
237.0000 mL | Freq: Three times a day (TID) | ORAL | Status: DC
Start: 2016-09-15 — End: 2016-09-21
  Administered 2016-09-15 – 2016-09-19 (×7): 237 mL via ORAL
  Filled 2016-09-15 (×7): qty 237

## 2016-09-15 NOTE — Progress Notes (Signed)
Subjective: Interval History: has complaints bad taste in mouth, no appetite.  does't feel good.  Objective: Vital signs in last 24 hours: Temp:  [97.3 F (36.3 C)-98.3 F (36.8 C)] 97.6 F (36.4 C) (12/01 0845) Pulse Rate:  [85-93] 89 (12/01 0845) Resp:  [17-24] 24 (12/01 0845) BP: (147-181)/(88-118) 166/98 (12/01 1033) SpO2:  [96 %-98 %] 96 % (12/01 0500) Weight:  [65.8 kg (145 lb)-70.7 kg (155 lb 13.8 oz)] 65.8 kg (145 lb) (12/01 0500) Weight change: -1.422 kg (-3 lb 2.2 oz)  Intake/Output from previous day: 11/30 0701 - 12/01 0700 In: 480 [P.O.:480] Out: 3000 [Urine:500] Intake/Output this shift: Total I/O In: 240 [P.O.:240] Out: 225 [Urine:225]  General appearance: alert, cooperative and no distress Neck: RIJ cath Resp: diminished breath sounds bilaterally and rales bibasilar Cardio: S1, S2 normal and systolic murmur: holosystolic 2/6, blowing at apex GI: pos bs, liver down 5 cm, soft Extremities: extremities normal, atraumatic, no cyanosis or edema  Lab Results:  Recent Labs  09/14/16 1615 09/15/16 0309  WBC 18.2* 13.6*  HGB 10.1* 11.2*  HCT 28.6* 31.5*  PLT 381 399   BMET:  Recent Labs  09/14/16 1616 09/15/16 0309  NA 134* 133*  K 4.3 3.8  CL 95* 95*  CO2 16* 20*  GLUCOSE 95 82  BUN 176* 108*  CREATININE 10.38* 7.73*  CALCIUM 8.7* 8.7*    Recent Labs  09/14/16 1615  PTH 241*  Comment   Iron Studies:  Recent Labs  09/14/16 1615  IRON 77  TIBC 225*    Studies/Results: Ir Fluoro Guide Cv Line Right  Result Date: 09/14/2016 INDICATION: 55 year old male with a history of acute kidney insufficiency superimposed on chronic kidney disease. EXAM: TUNNELED CENTRAL VENOUS HEMODIALYSIS CATHETER PLACEMENT WITH ULTRASOUND AND FLUOROSCOPIC GUIDANCE MEDICATIONS: None ANESTHESIA/SEDATION: None FLUOROSCOPY TIME:  Fluoroscopy Time: 0 minutes 6 seconds (1 mGy). COMPLICATIONS: None PROCEDURE: Informed written consent was obtained from the patient and the  patient's family after a thorough discussion of the procedural risks, benefits and alternatives. All questions were addressed. A timeout was performed prior to the initiation of the procedure. The right neck and chest was prepped with chlorhexidine, and draped in the usual sterile fashion using maximum barrier technique (cap and mask, sterile gown, sterile gloves, large sterile sheet, hand hygiene and cutaneous antiseptic). Local anesthesia was attained by infiltration with 1% lidocaine without epinephrine. Ultrasound demonstrated patency of the right internal jugular vein, and this was documented with an image. Under real-time ultrasound guidance, this vein was accessed with a 21 gauge micropuncture needle and image documentation was performed. A small dermatotomy was made at the access site with an 11 scalpel. A 0.018" wire was advanced into the SVC and the access needle exchanged for a 66F micropuncture vascular sheath. The 0.018" wire was then removed and a 0.035" wire advanced into the IVC. Upon withdrawal of the 018 wire, the wire was marked for appropriate length of the internal portion of the catheter. A 19 cm catheter was selected. Skin and subcutaneous tissues were serially dilated. Catheter was placed on the wire. The catheter tip is positioned in the upper right atrium. This was documented with a spot image. Both ports of the hemodialysis catheter were then tested for excellent function. The ports were then locked with heparinized lock. Patient tolerated the procedure well and remained hemodynamically stable throughout. No complications were encountered and no significant blood loss was encountered. IMPRESSION: Status post right IJ approach temporary HD catheter placement. Catheter ready for use. Signed,  Dulcy Fanny. Earleen Newport, DO [facility] Radiology Electronically Signed   By: [name] D.O.   On: 09/14/2016 12:49   Ir US Guide Vasc Access Right  Result  Date: 09/14/2016 INDICATION: 55 year old male with a history of acute kidney insufficiency superimposed on chronic kidney disease. EXAM: TUNNELED CENTRAL VENOUS HEMODIALYSIS CATHETER PLACEMENT WITH ULTRASOUND AND FLUOROSCOPIC GUIDANCE MEDICATIONS: None ANESTHESIA/SEDATION: None FLUOROSCOPY TIME:  Fluoroscopy Time: 0 minutes 6 seconds (1 mGy). COMPLICATIONS: None PROCEDURE: Informed written consent was obtained from the patient and the patient's family after a thorough discussion of the procedural risks, benefits and alternatives. All questions were addressed. A timeout was performed prior to the initiation of the procedure. The right neck and chest was prepped with chlorhexidine, and draped in the usual sterile fashion using maximum barrier technique (cap and mask, sterile gown, sterile gloves, large sterile sheet, hand hygiene and cutaneous antiseptic). Local anesthesia was attained by infiltration with 1% lidocaine without epinephrine. Ultrasound demonstrated patency of the right internal jugular vein, and this was documented with an image. Under real-time ultrasound guidance, this vein was accessed with a 21 gauge micropuncture needle and image documentation was performed. A small dermatotomy was made at the access site with an 11 scalpel. A 0.018" wire was advanced into the SVC and the access needle exchanged for a 24F micropuncture vascular sheath. The 0.018" wire was then removed and a 0.035" wire advanced into the IVC. Upon withdrawal of the 018 wire, the wire was marked for appropriate length of the internal portion of the catheter. A 19 cm catheter was selected. Skin and subcutaneous tissues were serially dilated. Catheter was placed on the wire. The catheter tip is positioned in the upper right atrium. This was documented with a spot image. Both ports of the hemodialysis catheter were then tested for excellent function. The ports were then locked with heparinized lock. Patient tolerated the procedure well  and remained hemodynamically stable throughout. No complications were encountered and no significant blood loss was encountered. IMPRESSION: Status post right IJ approach temporary HD catheter placement. Catheter ready for use. Signed, Dulcy Fanny. Earleen Newport, DO [facility] Radiology Electronically Signed   By: [name] D.O.   On: 09/14/2016 12:49    I have reviewed the patient's current medications.  Assessment/Plan: 1 CKD 5 now ESRD for HD today, did well yest. Uremic , do again tomorrow 2 HTN better post HD 3 Anemia esa/Fe when indic 4 HPTH vit D 5 substance abuse P HD qd x3, vit D, bp meds, access   LOS: 6 days   Nathali Vent L 09/15/2016,1:10 PM

## 2016-09-15 NOTE — Evaluation (Signed)
Occupational Therapy Evaluation Patient Details Name: Timothy [name] MRN: [id number] DOB: [birth year] Today's Date: 09/15/2016    History of Present Illness 55yo male with hx dCHF, colon cancer, uncontrolled HTN, CKD, polysubstance abuse presented 11/25 with SOB, chest pain after taking unknown "pain pill" that he bought off the street.  In ER had HTN with SBP 200's despite nitro gtt, acute on chronic renal failure with Scr 8.5, BNP 8786, metabolic acidosis.  He had waxing and waning mental status and worsening SOB despite bipap and was intubated in ER.  Started on cardene and propofol gtt with slow improvement in BP.    Clinical Impression   Pt reports he was independent with ADL PTA. Currently pt overall mod assist for sit to stand from EOB, min assist for seated ADL, and mod assist for LB ADL with sit to stand. Pt presenting with impaired cognition, poor sitting/standing balance, generalized weakness, fatigue impacting his independence and safety with ADL and functional mobility. Recommending CIR level therapies to maximize independence and safety with ADL and functional mobility prior to return home. Pt would benefit from continued skilled OT to address established goals.    Follow Up Recommendations  CIR;Supervision/Assistance - 24 hour    Equipment Recommendations  Other (comment) (TBD at next venue)    Recommendations for Other Services       Precautions / Restrictions Precautions Precautions: Fall Restrictions Weight Bearing Restrictions: No      Mobility Bed Mobility Overal bed mobility: Needs Assistance Bed Mobility: Supine to Sit;Sit to Supine     Supine to sit: Min guard Sit to supine: Min guard   General bed mobility comments: Min guard due to poor balance with movement. HOB flat without use of bed rail.  Transfers Overall transfer level: Needs assistance Equipment used: None Transfers: Sit to/from Stand Sit to Stand: Mod assist         General  transfer comment: Mod assist for balance; pt with anterior/posteior weight shifting throughout eventually losing his balance back onto the bed.    Balance Overall balance assessment: Needs assistance Sitting-balance support: Feet supported;No upper extremity supported Sitting balance-Leahy Scale: Poor Sitting balance - Comments: pt waxing and waining ant/post   Standing balance support: No upper extremity supported Standing balance-Leahy Scale: Poor Standing balance comment: mod external assist for standing balance                            ADL Overall ADL's : Needs assistance/impaired     Grooming: Min guard;Sitting Grooming Details (indicate cue type and reason): for balance Upper Body Bathing: Minimal assitance;Sitting   Lower Body Bathing: Moderate assistance;Sit to/from stand   Upper Body Dressing : Minimal assistance;Sitting   Lower Body Dressing: Moderate assistance;Sit to/from stand Lower Body Dressing Details (indicate cue type and reason): Pt able to doff/don sock sitting EOB. Assist for balance and anticipate pt would require assist pulling up pants in standing.               General ADL Comments: Pt agreeable to perform sit to stand for repositioning but declining to perform functional mobility at this time due to fatigue from HD. Discussed post acute rehab; pt reporting he wants to go home but agreeable to CIR by end of session.     Vision Additional Comments: Needs further assessment.   Perception     Praxis      Pertinent Vitals/Pain Pain Assessment: No/denies pain (pt does report fatigue)  Hand Dominance Right   Extremity/Trunk Assessment Upper Extremity Assessment Upper Extremity Assessment: Generalized weakness   Lower Extremity Assessment Lower Extremity Assessment: Defer to PT evaluation   Cervical / Trunk Assessment Cervical / Trunk Assessment: Normal   Communication Communication Communication: No difficulties    Cognition Arousal/Alertness: Awake/alert Behavior During Therapy: Flat affect;Impulsive Overall Cognitive Status: Impaired/Different from baseline Area of Impairment: Following commands;Safety/judgement;Orientation;Awareness;Problem solving Orientation Level: Disoriented to;Situation     Following Commands: Follows one step commands with increased time Safety/Judgement: Decreased awareness of safety;Decreased awareness of deficits Awareness: Emergent Problem Solving: Slow processing;Decreased initiation;Difficulty sequencing;Requires verbal cues General Comments: Cannot identify why he is in the hospital. Impulsive with transfers and decreased safety awareness.    General Comments       Exercises       Shoulder Instructions      Home Living Family/patient expects to be discharged to:: Inpatient rehab                                 Additional Comments: pt lived at home with wife in 1 story home with 2 steps to enter, no handrail      Prior Functioning/Environment Level of Independence: Independent        Comments: pt was indep and working PTA. wife works during the day, 2nd shift        OT Problem List: Decreased strength;Decreased activity tolerance;Impaired balance (sitting and/or standing);Decreased coordination;Decreased cognition;Decreased safety awareness;Decreased knowledge of use of DME or AE   OT Treatment/Interventions: Self-care/ADL training;Therapeutic exercise;Energy conservation;DME and/or AE instruction;Therapeutic activities;Cognitive remediation/compensation;Patient/family education;Balance training    OT Goals(Current goals can be found in the care plan section) Acute Rehab OT Goals Patient Stated Goal: not feel so tired OT Goal Formulation: With patient Time For Goal Achievement: 09/29/16 Potential to Achieve Goals: Good ADL Goals Pt Will Perform Grooming: with min guard assist;standing Pt Will Perform Upper Body Bathing: with  supervision;sitting Pt Will Perform Lower Body Bathing: with min guard assist;sit to/from stand Pt Will Transfer to Toilet: with min assist;ambulating;regular height toilet Additional ADL Goal #1: Pt will follow 3 step command in a minimally distracting environment without cues.  OT Frequency: Min 2X/week   Barriers to D/C:            Co-evaluation              End of Session    Activity Tolerance: Patient limited by fatigue Patient left: in bed;with call bell/phone within reach;with bed alarm set   Time: 1129-1141 OT Time Calculation (min): 12 min Charges:  OT General Charges $OT Visit: 1 Procedure OT Evaluation $OT Eval Moderate Complexity: 1 Procedure G-Codes:     Binnie Kand M.S., OTR/L Pager: [phone]  09/15/2016, 12:02 PM

## 2016-09-15 NOTE — Progress Notes (Signed)
Nutrition Follow-up  DOCUMENTATION CODES:   Not applicable  INTERVENTION:    Nepro Shake po TID, each supplement provides 425 kcal and 19 grams protein  NUTRITION DIAGNOSIS:   Inadequate oral intake related to altered taste as evidenced by patient/family report.  Ongoing  GOAL:   Patient will meet greater than or equal to 90% of their needs  Unmet  MONITOR:   PO intake, Supplement acceptance, Labs  ASSESSMENT:   55yo male with hx dCHF, colon cancer, uncontrolled HTN, CKD, polysubstance abuse presented 11/25 with SOB, chest pain after taking unknown "pain pill" that he bought off the street.  In ER had HTN with SBP 200's despite nitro gtt, acute on chronic renal failure with Scr 8.5, BNP 1157, metabolic acidosis.  He had waxing and waning mental status and worsening SOB despite bipap and was intubated in ER.  Started on cardene and propofol gtt with slow improvement in BP.   Spoke with patient and his family members at bedside. Patient has been eating poorly. Does not like the hospital food. He c/o metallic taste in his mouth with everything he eats. He agreed to try Nepro Shakes between meals.  From review of weights, he is down 58 lbs over the past year (29% weight loss). Suspect protein calorie malnutrition. Patient would not agree to have nutrition focused physical exam performed at this time.   Labs reviewed sodium low at 134, phosphorus elevated at 11.5 Medications reviewed and include Phoslo, Flomax, renal MVI.  Diet Order:  Diet renal/carb modified with fluid restriction Diet-HS Snack? Nothing; Room service appropriate? Yes; Fluid consistency: Thin  Skin:  Reviewed, no issues  Last BM:  12/1  Height:   Ht Readings from Last 1 Encounters:  09/13/16 6\' 1"  (1.854 m)    Weight:   Wt Readings from Last 1 Encounters:  09/15/16 145 lb (65.8 kg)    Ideal Body Weight:  86.36 kg  BMI:  Body mass index is 19.13 kg/m.  Estimated Nutritional Needs:   Kcal:   2620-3559  Protein:  85-100 gm  Fluid:  1.2 L  EDUCATION NEEDS:   No education needs identified at this time  Molli Barrows, [place], [place], [place] Pager [phone] After Hours Pager [phone]

## 2016-09-15 NOTE — Progress Notes (Signed)
PROGRESS NOTE    Timothy [name]  HDQ:[id number] DOB: Apr 17, 1961 DOA: 09/09/2016 PCP: No PCP Per Patient   Chief Complaint  Patient presents with  . Shortness of Breath  . took unknown pill    Brief Narrative:  HPI on 09/09/2016 by Dr. Rush Landmark [facility]) [phone] male with hx dCHF, colon cancer, uncontrolled HTN, CKD, polysubstance abuse presented 11/25 with SOB, chest pain after taking unknown "pain pill" that he bought off the street.  In ER had HTN with SBP 200's despite nitro gtt, acute on chronic renal failure with Scr 8.5, BNP 1740, metabolic acidosis.  He had waxing and waning mental status and worsening SOB despite bipap and was intubated in ER.  Started on cardene and propofol gtt with slow improvement in BP.  PCCM called to admit.   Assessment & Plan   Hypertensive emergency -Patient required Cardene drip -Currently on amlodipine, labetalol, bidil, as needed hydralazine -Suspect this will improve with dialysis  Acute respiratory failure with hypoxia and pulmonary edema secondary to Acute diastolic heart failure and worsening renal function -Noted to also have been in hypertensive crisis upon admission -Improving -Patient did require intubation -Echocardiogram showed EF of 81-44%  Acute metabolic encephalopathy -Likely multifactorial related to recent unknown medications presumed to be narcotic (responded briefly to Narcan), hypoxemia, uremia, and possible PRES -CT head showed no acute findings -Patient appears to be back to baseline mentation  Elevated troponin -Echocardiogram as above -Likely secondary to demand ischemia in the setting of severe hypertension, respiratory distress and worsening kidney failure  Acute renal failure superimposed on stage 5 chronic kidney disease, progressing -Baseline creatinine in 2016 was 1.5 -Upon admission, creatinine 8.5, BUN of 109 -Currently creatinine 7.73, BUN 108 -Nephrology consulted and appreciated -Suspect  patient will need hemodialysis today -Vein mapping ordered -Plan for vascular access on 09/19/16  Heroin abuse/Polysubstance abuse -Upon admission, patient was lethargic and unable to provide adequate history regarding current use of hair when and when last used -Urine drug screen positive for opiates and THC -Monitor for withdrawal symptoms  Nonadherence to medical treatment -Unable to address at this juncture due to patient's metabolic encephalopathy and inability to participate with history  Anemia due to chronic kidney disease -Hemoglobin close to baseline, currently 11.2 -Continue to monitor CBC   Hepatitis C antibody positive -Patient will need outpatient follow-up  Physical deconditioning -PT recommended CIR -Inpatient rehabilitation consulted  DVT Prophylaxis  heparin  Code Status: Full  Family Communication: None at bedside  Disposition Plan: Admitted.   Consultants Nephrology PCCM Interventional radiology PMR Vascular surgery  Procedures/ Significant Events 11/25  Admit with hypertensive crisis, acute on chronic renal failure, resp fx requiring intubation.  11/27  Remains on cardene gtt, intermittent agitation  11/24  Echocardiogram: moderate LVH, EF 60-65%, mild MR, patent PFO can not be excluded  11/28  Extubated 11/29  Remains on cardene, tolerated extubation  11/30  Placement of right IJ temp HD cath  Antibiotics   Anti-infectives    Start     Dose/Rate Route Frequency Ordered Stop   09/12/16 1030  levofloxacin (LEVAQUIN) IVPB 500 mg  Status:  Discontinued     500 mg 100 mL/hr over 60 Minutes Intravenous Every 48 hours 09/12/16 0954 09/13/16 1427      Subjective:   [name] seen and examined today.  Patient does not want to dialyze today and feels weak.  Also complains of headache.  Denies chest pain, shortness of breath, abdominal pain.   Objective:  Vitals:   09/14/16 2319 09/15/16 0500 09/15/16 0845 09/15/16 1033  BP: (!)  154/105 (!) 173/112 (!) 170/118 (!) 166/98  Pulse: 85 88 89   Resp: 17 (!) 24 (!) 24   Temp: 97.3 F (36.3 C) 97.3 F (36.3 C) 97.6 F (36.4 C)   TempSrc:      SpO2: 98% 96%    Weight:  65.8 kg (145 lb)    Height:        Intake/Output Summary (Last 24 hours) at 09/15/16 1238 Last data filed at 09/15/16 0846  Gross per 24 hour  Intake              720 ml  Output             2925 ml  Net            -2205 ml   Filed Weights   09/14/16 1601 09/14/16 1909 09/15/16 0500  Weight: 70.7 kg (155 lb 13.8 oz) 68.2 kg (150 lb 5.7 oz) 65.8 kg (145 lb)    Exam  General: Well developed,thin, NAD, appears stated age  81: NCAT, mucous membranes moist.   Cardiovascular: S1 S2 auscultated, 3/6 SEM, RRR  Respiratory: Diminished breath sounds, +rales   Abdomen: Soft, nontender, nondistended, + bowel sounds  Extremities: warm dry without cyanosis clubbing or edema  Neuro: AAOx3, nonfocal  Psych: Agitated    Data Reviewed: I have personally reviewed following labs and imaging studies  CBC:  Recent Labs Lab 09/12/16 0548 09/13/16 0554 09/14/16 0313 09/14/16 1615 09/15/16 0309  WBC 8.8 14.0* 17.0* 18.2* 13.6*  HGB 10.7* 10.0* 9.5* 10.1* 11.2*  HCT 31.6* 28.6* 26.8* 28.6* 31.5*  MCV 84.7 82.2 81.2 80.8 80.6  PLT 235 257 326 381 387   Basic Metabolic Panel:  Recent Labs Lab 09/09/16 0943  09/10/16 0253  09/11/16 0551  09/12/16 0548  09/13/16 0012 09/13/16 0554 09/14/16 0313 09/14/16 1615 09/14/16 1616 09/15/16 0309  NA  --   < > 140  --  139  --  137  --   --  132* 133*  --  134* 133*  K  --   < > 3.4*  < > 3.9  < > 4.5  < > 4.5 4.1 4.2  --  4.3 3.8  CL  --   < > 105  --  105  --  102  --   --  97* 96*  --  95* 95*  CO2  --   < > 19*  --  21*  --  17*  --   --  16* 17*  --  16* 20*  GLUCOSE  --   < > 81  --  93  --  75  --   --  86 61*  --  95 82  BUN  --   < > 113*  --  115*  --  131*  --   --  142* 161*  --  176* 108*  CREATININE  --   < > 8.29*  --  8.44*   --  8.85*  --   --  8.58* 9.64*  --  10.38* 7.73*  CALCIUM  --   < > 7.9*  --  8.1*  --  7.9*  --   --  8.4* 8.6* 8.4* 8.7* 8.7*  MG 2.3  --  2.1  --  2.2  --   --   --   --   --   --   --   --   --  PHOS 7.3*  --  6.9*  --  8.4*  --   --   --   --   --  11.6*  --  11.5*  --   < > = values in this interval not displayed. GFR: Estimated Creatinine Clearance: 10 mL/min (by C-G formula based on SCr of 7.73 mg/dL (H)). Liver Function Tests:  Recent Labs Lab 09/09/16 0509 09/10/16 0253 09/14/16 1616  AST 45* 34  --   ALT 34 30  --   ALKPHOS 63 59  --   BILITOT 0.3 0.3  --   PROT 6.2* 5.8*  --   ALBUMIN 3.1* 2.8* 2.5*    Recent Labs Lab 09/09/16 0943  LIPASE 72*  AMYLASE 233*    Recent Labs Lab 09/09/16 0943  AMMONIA 23   Coagulation Profile: No results for input(s): INR, PROTIME in the last 168 hours. Cardiac Enzymes:  Recent Labs Lab 09/09/16 0537 09/09/16 0943 09/09/16 1439 09/09/16 2123  TROPONINI 0.42* 0.44* 0.41* 0.33*   BNP (last 3 results) No results for input(s): PROBNP in the last 8760 hours. HbA1C: No results for input(s): HGBA1C in the last 72 hours. CBG:  Recent Labs Lab 09/14/16 0512 09/14/16 0656 09/14/16 0742 09/14/16 2119 09/15/16 0800  GLUCAP 64* 71 68 83 94   Lipid Profile: No results for input(s): CHOL, HDL, LDLCALC, TRIG, CHOLHDL, LDLDIRECT in the last 72 hours. Thyroid Function Tests: No results for input(s): TSH, T4TOTAL, FREET4, T3FREE, THYROIDAB in the last 72 hours. Anemia Panel:  Recent Labs  09/14/16 1615  TIBC 225*  IRON 77   Urine analysis:    Component Value Date/Time   COLORURINE YELLOW 09/13/2016 1100   APPEARANCEUR CLEAR 09/13/2016 1100   LABSPEC 1.011 09/13/2016 1100   PHURINE 5.0 09/13/2016 1100   GLUCOSEU NEGATIVE 09/13/2016 1100   HGBUR MODERATE (A) 09/13/2016 1100   BILIRUBINUR NEGATIVE 09/13/2016 1100   KETONESUR NEGATIVE 09/13/2016 1100   PROTEINUR 100 (A) 09/13/2016 1100   NITRITE NEGATIVE  09/13/2016 1100   LEUKOCYTESUR NEGATIVE 09/13/2016 1100   Sepsis Labs: @LABRCNTIP (procalcitonin:4,lacticidven:4)  ) Recent Results (from the past 240 hour(s))  Culture, blood (routine x 2)     Status: None   Collection Time: 09/09/16  9:55 AM  Result Value Ref Range Status   Specimen Description BLOOD RIGHT HAND  Final   Special Requests BOTTLES DRAWN AEROBIC AND ANAEROBIC  10CC  Final   Culture NO GROWTH 5 DAYS  Final   Report Status 09/14/2016 FINAL  Final  Culture, blood (routine x 2)     Status: None   Collection Time: 09/09/16  9:58 AM  Result Value Ref Range Status   Specimen Description BLOOD LEFT HAND  Final   Special Requests BOTTLES DRAWN AEROBIC AND ANAEROBIC  5CC  Final   Culture NO GROWTH 5 DAYS  Final   Report Status 09/14/2016 FINAL  Final  MRSA PCR Screening     Status: None   Collection Time: 09/09/16 10:52 AM  Result Value Ref Range Status   MRSA by PCR NEGATIVE NEGATIVE Final    Comment:        The GeneXpert MRSA Assay (FDA approved for NASAL specimens only), is one component of a comprehensive MRSA colonization surveillance program. It is not intended to diagnose MRSA infection nor to guide or monitor treatment for MRSA infections.       Radiology Studies: Ir Fluoro Guide Cv Line Right  Result Date: 09/14/2016 INDICATION: 55 year old male with a history of acute kidney  insufficiency superimposed on chronic kidney disease. EXAM: TUNNELED CENTRAL VENOUS HEMODIALYSIS CATHETER PLACEMENT WITH ULTRASOUND AND FLUOROSCOPIC GUIDANCE MEDICATIONS: None ANESTHESIA/SEDATION: None FLUOROSCOPY TIME:  Fluoroscopy Time: 0 minutes 6 seconds (1 mGy). COMPLICATIONS: None PROCEDURE: Informed written consent was obtained from the patient and the patient's family after a thorough discussion of the procedural risks, benefits and alternatives. All questions were addressed. A timeout was performed prior to the initiation of the procedure. The right neck and chest was prepped  with chlorhexidine, and draped in the usual sterile fashion using maximum barrier technique (cap and mask, sterile gown, sterile gloves, large sterile sheet, hand hygiene and cutaneous antiseptic). Local anesthesia was attained by infiltration with 1% lidocaine without epinephrine. Ultrasound demonstrated patency of the right internal jugular vein, and this was documented with an image. Under real-time ultrasound guidance, this vein was accessed with a 21 gauge micropuncture needle and image documentation was performed. A small dermatotomy was made at the access site with an 11 scalpel. A 0.018" wire was advanced into the SVC and the access needle exchanged for a 31F micropuncture vascular sheath. The 0.018" wire was then removed and a 0.035" wire advanced into the IVC. Upon withdrawal of the 018 wire, the wire was marked for appropriate length of the internal portion of the catheter. A 19 cm catheter was selected. Skin and subcutaneous tissues were serially dilated. Catheter was placed on the wire. The catheter tip is positioned in the upper right atrium. This was documented with a spot image. Both ports of the hemodialysis catheter were then tested for excellent function. The ports were then locked with heparinized lock. Patient tolerated the procedure well and remained hemodynamically stable throughout. No complications were encountered and no significant blood loss was encountered. IMPRESSION: Status post right IJ approach temporary HD catheter placement. Catheter ready for use. Signed, Dulcy Fanny. Earleen Newport, DO [facility] Radiology Electronically Signed   By: [name] D.O.   On: 09/14/2016 12:49   Ir US Guide Vasc Access Right  Result Date: 09/14/2016 INDICATION: 55 year old male with a history of acute kidney insufficiency superimposed on chronic kidney disease. EXAM: TUNNELED CENTRAL VENOUS HEMODIALYSIS CATHETER PLACEMENT WITH ULTRASOUND AND FLUOROSCOPIC  GUIDANCE MEDICATIONS: None ANESTHESIA/SEDATION: None FLUOROSCOPY TIME:  Fluoroscopy Time: 0 minutes 6 seconds (1 mGy). COMPLICATIONS: None PROCEDURE: Informed written consent was obtained from the patient and the patient's family after a thorough discussion of the procedural risks, benefits and alternatives. All questions were addressed. A timeout was performed prior to the initiation of the procedure. The right neck and chest was prepped with chlorhexidine, and draped in the usual sterile fashion using maximum barrier technique (cap and mask, sterile gown, sterile gloves, large sterile sheet, hand hygiene and cutaneous antiseptic). Local anesthesia was attained by infiltration with 1% lidocaine without epinephrine. Ultrasound demonstrated patency of the right internal jugular vein, and this was documented with an image. Under real-time ultrasound guidance, this vein was accessed with a 21 gauge micropuncture needle and image documentation was performed. A small dermatotomy was made at the access site with an 11 scalpel. A 0.018" wire was advanced into the SVC and the access needle exchanged for a 31F micropuncture vascular sheath. The 0.018" wire was then removed and a 0.035" wire advanced into the IVC. Upon withdrawal of the 018 wire, the wire was marked for appropriate length of the internal portion of the catheter. A 19 cm catheter was selected. Skin and subcutaneous tissues were serially dilated. Catheter was placed on the  wire. The catheter tip is positioned in the upper right atrium. This was documented with a spot image. Both ports of the hemodialysis catheter were then tested for excellent function. The ports were then locked with heparinized lock. Patient tolerated the procedure well and remained hemodynamically stable throughout. No complications were encountered and no significant blood loss was encountered. IMPRESSION: Status post right IJ approach temporary HD catheter placement. Catheter ready for use.  Signed, Dulcy Fanny. Earleen Newport, DO [facility] Radiology Electronically Signed   By: [name] D.O.   On: 09/14/2016 12:49     Scheduled Meds: . amLODipine  10 mg Oral QHS  . calcium acetate  1,334 mg Oral TID WC  . heparin subcutaneous  5,000 Units Subcutaneous Q8H  . Influenza vac split quadrivalent PF  0.5 mL Intramuscular Tomorrow-1000  . isosorbide-hydrALAZINE  1 tablet Oral TID  . labetalol  100 mg Oral TID  . LORazepam  0.5 mg Oral BID  . multivitamin  1 tablet Oral QHS  . pneumococcal 23 valent vaccine  0.5 mL Intramuscular Tomorrow-1000  . tamsulosin  0.4 mg Oral Daily  . tuberculin  5 Units Intradermal Once   Continuous Infusions:   LOS: 6 days   Time Spent in minutes   30 minutes  Brin Ruggerio D.O. on 09/15/2016 at 12:38 PM  Between 7am to 7pm - Pager - [phone]  After 7pm go to [email or web address] - password [email or web address]  And look for the night coverage person covering for me after hours  [facility] Office  [phone]

## 2016-09-15 NOTE — Progress Notes (Signed)
        Plan Access surgery 09/19/2016 Left AV fistula verses graft pending vein mapping.   Ventura Leggitt MAUREEN PA-C

## 2016-09-15 NOTE — Progress Notes (Signed)
Upper Extremity Vein Map    Right Cephalic- Very Superficial  Segment Diameter Depth Comment  1. Axilla 2.65mm mm   2. Mid upper arm 1.29mm mm   3. Above AC 1.65mm mm   4. In [facility] 2.29mm mm   5. Below AC 1.48mm mm   6. Mid forearm 1.62mm mm   7. Wrist 1.63mm mm    mm mm    mm mm    mm mm    Right Basilic  Segment Diameter Depth Comment  1. Axilla mm mm   2. Mid upper arm 5.16mm 3.57mm   3. Above [facility] 5.63mm 2.35mm   4. In [facility] 4.64mm 2.28mm THROMBUS  5. Below AC mm mm Not imaged due to IV bandages  6. Mid forearm mm mm   7. Wrist mm mm    mm mm    mm mm    mm mm    Left Cephalic- Very Superficial  Segment Diameter Depth Comment  1. Axilla 2.43mm mm   2. Mid upper arm 2.67mm mm   3. Above AC 3.33mm mm   4. In [facility] 2.68mm mm branch  5. Below AC 3.24mm mm   6. Mid forearm 3.30mm mm   7. Wrist 3.9mm mm    mm mm    mm mm    mm mm    Left Basilic  Segment Diameter Depth Comment  1. Axilla mm mm   2. Mid upper arm 5.27mm 4.9mm   3. Above [facility] 4.64mm 2.2mm branch  4. In AC 2.2mm mm   5. Below AC 3.38mm mm   6. Mid forearm mm mm   7. Wrist mm mm    mm mm    mm mm    mm mm     Landry Mellow, RDMS, RVT 09/15/2016

## 2016-09-15 NOTE — Procedures (Signed)
I was present at this session.  I have reviewed the session itself and made appropriate changes.  HD via temp cath, low flows ,short , 2nd tx.  bp ^  Vinal Rosengrant L 12/1/20175:18 PM

## 2016-09-16 LAB — CBC
HCT: 33.9 % — ABNORMAL LOW (ref 39.0–52.0)
Hemoglobin: 11.8 g/dL — ABNORMAL LOW (ref 13.0–17.0)
MCH: 28.5 pg (ref 26.0–34.0)
MCHC: 34.8 g/dL (ref 30.0–36.0)
MCV: 81.9 fL (ref 78.0–100.0)
PLATELETS: 405 10*3/uL — AB (ref 150–400)
RBC: 4.14 MIL/uL — ABNORMAL LOW (ref 4.22–5.81)
RDW: 12.6 % (ref 11.5–15.5)
WBC: 9.7 10*3/uL (ref 4.0–10.5)

## 2016-09-16 LAB — RENAL FUNCTION PANEL
ALBUMIN: 2.7 g/dL — AB (ref 3.5–5.0)
Anion gap: 15 (ref 5–15)
BUN: 84 mg/dL — AB (ref 6–20)
CO2: 23 mmol/L (ref 22–32)
CREATININE: 7.21 mg/dL — AB (ref 0.61–1.24)
Calcium: 9.1 mg/dL (ref 8.9–10.3)
Chloride: 92 mmol/L — ABNORMAL LOW (ref 101–111)
GFR calc Af Amer: 9 mL/min — ABNORMAL LOW (ref >60)
GFR, EST NON AFRICAN AMERICAN: 8 mL/min — AB (ref >60)
Glucose, Bld: 109 mg/dL — ABNORMAL HIGH (ref 65–99)
PHOSPHORUS: 6.4 mg/dL — AB (ref 2.5–4.6)
Potassium: 3.4 mmol/L — ABNORMAL LOW (ref 3.5–5.1)
Sodium: 130 mmol/L — ABNORMAL LOW (ref 135–145)

## 2016-09-16 LAB — GLUCOSE, CAPILLARY: Glucose-Capillary: 160 mg/dL — ABNORMAL HIGH (ref 65–99)

## 2016-09-16 MED ORDER — LIDOCAINE HCL (PF) 1 % IJ SOLN: 5.0000 mL | INTRAMUSCULAR | Status: DC | PRN

## 2016-09-16 MED ORDER — PENTAFLUOROPROP-TETRAFLUOROETH EX AERO: 1.0000 "application " | INHALATION_SPRAY | CUTANEOUS | Status: DC | PRN

## 2016-09-16 MED ORDER — HEPARIN SODIUM (PORCINE) 1000 UNIT/ML DIALYSIS: 1000.0000 [IU] | INTRAMUSCULAR | Status: DC | PRN

## 2016-09-16 MED ORDER — SODIUM CHLORIDE 0.9 % IV SOLN: 100.0000 mL | INTRAVENOUS | Status: DC | PRN

## 2016-09-16 MED ORDER — LIDOCAINE-PRILOCAINE 2.5-2.5 % EX CREA: 1.0000 "application " | TOPICAL_CREAM | CUTANEOUS | Status: DC | PRN

## 2016-09-16 MED ORDER — HYDRALAZINE HCL 20 MG/ML IJ SOLN
INTRAMUSCULAR | Status: AC
  Filled 2016-09-16: qty 1

## 2016-09-16 MED ORDER — HEPARIN SODIUM (PORCINE) 1000 UNIT/ML DIALYSIS: 40.0000 [IU]/kg | Freq: Once | INTRAMUSCULAR | Status: DC

## 2016-09-16 MED ORDER — ALTEPLASE 2 MG IJ SOLR: 2.0000 mg | Freq: Once | INTRAMUSCULAR | Status: DC | PRN

## 2016-09-16 NOTE — Progress Notes (Addendum)
Family of pt called out stating pt fell. Upon arrival found family helping/carrying pt back into bed. Pt stated he fell in the bathroom after urinated he tried to get up and fell back onto the toilet. Vital signs were taken and all within normal range. MD Posey Pronto paged and discussed situation over phone, multiple blood pressure medications were given to pt prior to fall. In report this nurse was given the information that pt used walker without assistance from staff. Pt chart had not yet been reviewed by this nurse. MD stated to hold Imdur and Labetalol for PM meds. Pt was checked head to toe with no signs of injury, able to move all limbs appropriately without pain. Pt stated stomach was burning and stated poor PO intake, CBG done with result of 160. Pt was given broth and crackers to try to settle his stomach while he waiting for wife to bring food from home. Pt resting comfortably with bed alarm on and educated to not try to ambulate without assistance.    09/16/16 1935  What Happened  Was fall witnessed? No  Was patient injured? No  Patient found other (Comment) (family got pt back into bed before staff arrived)  Found by (family)  Stated prior activity bathroom-unassisted  Follow Up  MD notified Posey Pronto  Time MD notified [place] notified Yes-comment (family present at time of fall)  Time family notified 1935  Additional tests No  Simple treatment Other (comment) (food, pt stated poor PO intake, CBG done, result 160)  Progress note created (see row info) Yes  Adult Fall Risk Assessment  Risk Factor Category (scoring not indicated) Fall has occurred during this admission (document High fall risk)  Age 55  Fall History: Fall within 6 months prior to admission 5  Elimination; Bowel and/or Urine Incontinence 0  Elimination; Bowel and/or Urine Urgency/Frequency 2  Medications: includes PCA/Opiates, Anti-convulsants, Anti-hypertensives, Diuretics, Hypnotics, Laxatives, Sedatives, and  Psychotropics 5  Patient Care Equipment 1  Mobility-Assistance 2  Mobility-Gait 2  Mobility-Sensory Deficit 0  Altered awareness of immediate physical environment 0  Impulsiveness 2  Lack of understanding of one's physical/cognitive limitations 4  Total Score 23  Patient's Fall Risk High Fall Risk (>13 points)  Adult Fall Risk Interventions  Required Bundle Interventions *See Row Information* High fall risk - low, moderate, and high requirements implemented  Additional Interventions Family Supervision;Individualized elimination schedule;Use of appropriate toileting equipment (bedpan, BSC, etc.)  Screening for Fall Injury Risk  Risk For Fall Injury- See Row Information  Nurse judgement;F  Injury Prevention Interventions Family supervision  Vitals  Temp 97.7 F (36.5 C)  Temp Source Oral  BP (!) 134/98  BP Location Right Arm  BP Method Automatic  Patient Position (if appropriate) Lying  Pulse Rate 90  Pulse Rate Source Dinamap  Oxygen Therapy  SpO2 97 %  O2 Device Room Air  Pain Assessment  Pain Assessment 0-10  Pain Score 5  Pain Type Acute pain  Pain Location Abdomen  Pain Orientation Mid;Lower  Pain Descriptors / Indicators Burning  Pain Frequency Constant  Pain Onset Gradual  Patients Stated Pain Goal 0  Pain Intervention(s) Food  Multiple Pain Sites No  Neurological  Neuro (WDL) WDL  Level of Consciousness Alert  Orientation Level Oriented X4  Cognition Follows commands;Appropriate at baseline;Poor safety awareness  Speech Clear  Pupil Assessment  No  Neuro Symptoms Drowsiness;Fatigue  Neuro Additional Assessments No  Glasgow Coma Scale  Eye Opening 4  Best Verbal Response (NON-intubated) 5  Best Motor Response 6  Glasgow Coma Scale Score 15  Musculoskeletal  Musculoskeletal (WDL) X  Assistive Device Four wheel walker  Generalized Weakness Yes  Weight Bearing Restrictions No  Musculoskeletal Details  LLE Weakness  RLE Weakness  Integumentary   Integumentary (WDL) X  Skin Color Appropriate for ethnicity  Skin Condition Dry;Flaky  Skin Integrity Intact  Skin Turgor Non-tenting

## 2016-09-16 NOTE — Progress Notes (Signed)
Patient transferred to 6e25 from 3W immediately following hemodialysis. Alert and oriented. Denies pain. R IJ hemodialysis catheter intact. R NSL. Ambulates with front wheel rolling walker without difficulty. No skin problems except dry, flaky skin especially to lower extremity. Skin assessed by 2 RNs. Telemetry #25 placed on patient. Central telemetry notified, and verified by 2nd staff member. BP elevated. Will continue to monitor. Bartholomew Crews, RN

## 2016-09-16 NOTE — Procedures (Signed)
I was present at this session.  I have reviewed the session itself and made appropriate changes.  HD via temp cath, 3rd HD, lower vol and solute  Shadavia Dampier L 12/2/201712:03 PM

## 2016-09-16 NOTE — Progress Notes (Signed)
Subjective: Interval History: has no complaint, starting to eat better.  Objective: Vital signs in last 24 hours: Temp:  [98 F (36.7 C)-98.7 F (37.1 C)] 98 F (36.7 C) (12/02 0500) Pulse Rate:  [83-94] 85 (12/02 0500) Resp:  [12-18] 12 (12/02 0500) BP: (151-197)/(106-129) 154/106 (12/02 0641) SpO2:  [97 %-98 %] 97 % (12/02 0500) Weight:  [63 kg (139 lb)-65.6 kg (144 lb 10 oz)] 63 kg (139 lb) (12/02 0500) Weight change: -5.1 kg (-11 lb 3.9 oz)  Intake/Output from previous day: 12/01 0701 - 12/02 0700 In: 240 [P.O.:240] Out: 2725 [Urine:225] Intake/Output this shift: No intake/output data recorded.  General appearance: alert, cooperative and no distress Neck: RIJ cath Resp: decrease bs,  Cardio: S1, S2 normal and systolic murmur: holosystolic 2/6, blowing at apex, below GI: at apex, LV lift Extremities: extremities normal, atraumatic, no cyanosis or edema  GI liver down 6 cm,pos bs  Lab Results:  Recent Labs  09/14/16 1615 09/15/16 0309  WBC 18.2* 13.6*  HGB 10.1* 11.2*  HCT 28.6* 31.5*  PLT 381 399   BMET:  Recent Labs  09/14/16 1616 09/15/16 0309  NA 134* 133*  K 4.3 3.8  CL 95* 95*  CO2 16* 20*  GLUCOSE 95 82  BUN 176* 108*  CREATININE 10.38* 7.73*  CALCIUM 8.7* 8.7*    Recent Labs  09/14/16 1615  PTH 241*  Comment   Iron Studies:  Recent Labs  09/14/16 1615  IRON 77  TIBC 225*    Studies/Results: Ir Fluoro Guide Cv Line Right  Result Date: 09/14/2016 INDICATION: 55 year old male with a history of acute kidney insufficiency superimposed on chronic kidney disease. EXAM: TUNNELED CENTRAL VENOUS HEMODIALYSIS CATHETER PLACEMENT WITH ULTRASOUND AND FLUOROSCOPIC GUIDANCE MEDICATIONS: None ANESTHESIA/SEDATION: None FLUOROSCOPY TIME:  Fluoroscopy Time: 0 minutes 6 seconds (1 mGy). COMPLICATIONS: None PROCEDURE: Informed written consent was obtained from the patient and the patient's family after a thorough discussion of the procedural risks,  benefits and alternatives. All questions were addressed. A timeout was performed prior to the initiation of the procedure. The right neck and chest was prepped with chlorhexidine, and draped in the usual sterile fashion using maximum barrier technique (cap and mask, sterile gown, sterile gloves, large sterile sheet, hand hygiene and cutaneous antiseptic). Local anesthesia was attained by infiltration with 1% lidocaine without epinephrine. Ultrasound demonstrated patency of the right internal jugular vein, and this was documented with an image. Under real-time ultrasound guidance, this vein was accessed with a 21 gauge micropuncture needle and image documentation was performed. A small dermatotomy was made at the access site with an 11 scalpel. A 0.018" wire was advanced into the SVC and the access needle exchanged for a 17F micropuncture vascular sheath. The 0.018" wire was then removed and a 0.035" wire advanced into the IVC. Upon withdrawal of the 018 wire, the wire was marked for appropriate length of the internal portion of the catheter. A 19 cm catheter was selected. Skin and subcutaneous tissues were serially dilated. Catheter was placed on the wire. The catheter tip is positioned in the upper right atrium. This was documented with a spot image. Both ports of the hemodialysis catheter were then tested for excellent function. The ports were then locked with heparinized lock. Patient tolerated the procedure well and remained hemodynamically stable throughout. No complications were encountered and no significant blood loss was encountered. IMPRESSION: Status post right IJ approach temporary HD catheter placement. Catheter ready for use. Signed, Dulcy Fanny. Earleen Newport, DO Vascular and Interventional Radiology  Specialists [facility] Radiology Electronically Signed   By: [name] D.O.   On: 09/14/2016 12:49   Ir US Guide Vasc Access Right  Result Date: 09/14/2016 INDICATION: 55 year old male with a history of  acute kidney insufficiency superimposed on chronic kidney disease. EXAM: TUNNELED CENTRAL VENOUS HEMODIALYSIS CATHETER PLACEMENT WITH ULTRASOUND AND FLUOROSCOPIC GUIDANCE MEDICATIONS: None ANESTHESIA/SEDATION: None FLUOROSCOPY TIME:  Fluoroscopy Time: 0 minutes 6 seconds (1 mGy). COMPLICATIONS: None PROCEDURE: Informed written consent was obtained from the patient and the patient's family after a thorough discussion of the procedural risks, benefits and alternatives. All questions were addressed. A timeout was performed prior to the initiation of the procedure. The right neck and chest was prepped with chlorhexidine, and draped in the usual sterile fashion using maximum barrier technique (cap and mask, sterile gown, sterile gloves, large sterile sheet, hand hygiene and cutaneous antiseptic). Local anesthesia was attained by infiltration with 1% lidocaine without epinephrine. Ultrasound demonstrated patency of the right internal jugular vein, and this was documented with an image. Under real-time ultrasound guidance, this vein was accessed with a 21 gauge micropuncture needle and image documentation was performed. A small dermatotomy was made at the access site with an 11 scalpel. A 0.018" wire was advanced into the SVC and the access needle exchanged for a 69F micropuncture vascular sheath. The 0.018" wire was then removed and a 0.035" wire advanced into the IVC. Upon withdrawal of the 018 wire, the wire was marked for appropriate length of the internal portion of the catheter. A 19 cm catheter was selected. Skin and subcutaneous tissues were serially dilated. Catheter was placed on the wire. The catheter tip is positioned in the upper right atrium. This was documented with a spot image. Both ports of the hemodialysis catheter were then tested for excellent function. The ports were then locked with heparinized lock. Patient tolerated the procedure well and remained hemodynamically stable throughout. No complications  were encountered and no significant blood loss was encountered. IMPRESSION: Status post right IJ approach temporary HD catheter placement. Catheter ready for use. Signed, Dulcy Fanny. Earleen Newport, DO Vascular and Interventional Radiology Specialists [facility] Radiology Electronically Signed   By: [name] D.O.   On: 09/14/2016 12:49    I have reviewed the patient's current medications.  Assessment/Plan: 1  CKD5, ESRD Hd today then tiw.  To get access next week 2 Anemia esa 3HPTH vit D  4 HTN bp more stable, lower vol , lower meds 5 substance abuse P HD, esa as needed, vit D   LOS: 7 days   Jamaal Bernasconi L 09/16/2016,11:15 AM

## 2016-09-16 NOTE — Progress Notes (Signed)
PPD test negative. No signs of induration, redness, or swelling.

## 2016-09-16 NOTE — Progress Notes (Signed)
PROGRESS NOTE    Yovan Leeman  KKX:[id number] DOB: Dec 18, 1960 DOA: 09/09/2016 PCP: No PCP Per Patient   Chief Complaint  Patient presents with  . Shortness of Breath  . took unknown pill    Brief Narrative:  HPI on 09/09/2016 by Dr. Rush [facility]) [phone] male with hx dCHF, colon cancer, uncontrolled HTN, CKD, polysubstance abuse presented 11/25 with SOB, chest pain after taking unknown "pain pill" that he bought off the street.  In ER had HTN with SBP 200's despite nitro gtt, acute on chronic renal failure with Scr 8.5, BNP 7893, metabolic acidosis.  He had waxing and waning mental status and worsening SOB despite bipap and was intubated in ER.  Started on cardene and propofol gtt with slow improvement in BP.  PCCM called to admit.   Assessment & Plan   Hypertensive emergency -Patient required Cardene drip -Currently on amlodipine, labetalol, bidil, as needed hydralazine  Acute respiratory failure with hypoxia and pulmonary edema secondary to Acute diastolic heart failure and worsening renal function -Noted to also have been in hypertensive crisis upon admission -Improving -Patient did require intubation -Echocardiogram showed EF of 81-01%  Acute metabolic encephalopathy -Likely multifactorial related to recent unknown medications presumed to be narcotic (responded briefly to Narcan), hypoxemia, uremia, and possible PRES -CT head showed no acute findings -Patient appears to be back to baseline mentation  Elevated troponin -Echocardiogram as above -Likely secondary to demand ischemia in the setting of severe hypertension, respiratory distress and worsening kidney failure  Acute renal failure superimposed on stage 5 chronic kidney disease, progressing -Baseline creatinine in 2016 was 1.5 -Upon admission, creatinine 8.5, BUN of 109 -Currently creatinine 7.73, BUN 108 -Nephrology consulted and appreciated, started HD -Vein mapping ordered -Plan for vascular  access on 09/19/16  Heroin abuse/Polysubstance abuse -Upon admission, patient was lethargic and unable to provide adequate history regarding current use of hair when and when last used -Urine drug screen positive for opiates and THC -Monitor for withdrawal symptoms  Nonadherence to medical treatment -Unable to address at this juncture due to patient's metabolic encephalopathy and inability to participate with history  Anemia due to chronic kidney disease -Hemoglobin close to baseline, currently 11.2 -Continue to monitor CBC   Hepatitis C antibody positive -Patient will need outpatient follow-up  Physical deconditioning -PT recommended CIR -Inpatient rehabilitation consulted  DVT Prophylaxis  heparin  Code Status: Full  Family Communication: Family at bedside  Disposition Plan: Admitted. Plan for vascular access 12/5. Possibly CIR thereafter  Consultants Nephrology PCCM Interventional radiology PMR Vascular surgery  Procedures/ Significant Events 11/25  Admit with hypertensive crisis, acute on chronic renal failure, resp fx requiring intubation.  11/27  Remains on cardene gtt, intermittent agitation  11/24  Echocardiogram: moderate LVH, EF 60-65%, mild MR, patent PFO can not be excluded  11/28  Extubated 11/29  Remains on cardene, tolerated extubation  11/30  Placement of right IJ temp HD cath  Antibiotics   Anti-infectives    Start     Dose/Rate Route Frequency Ordered Stop   09/12/16 1030  levofloxacin (LEVAQUIN) IVPB 500 mg  Status:  Discontinued     500 mg 100 mL/hr over 60 Minutes Intravenous Every 48 hours 09/12/16 0954 09/13/16 1427      Subjective:   [name] seen and examined today.  Patient feeling better this morning.  Denies chest pain, shortness of breath, abdominal pain.   Objective:   Vitals:   09/15/16 1900 09/15/16 2148 09/16/16 0500 09/16/16 7510  BP: (!) 166/109 (!) 158/113 (!) 151/108 (!) 154/106  Pulse: 86 90 85   Resp: 17   12   Temp: 98.4 F (36.9 C)  98 F (36.7 C)   TempSrc: Oral  Oral   SpO2: 98%  97%   Weight: 63.1 kg (139 lb 1.8 oz)  63 kg (139 lb)   Height:        Intake/Output Summary (Last 24 hours) at 09/16/16 1126 Last data filed at 09/15/16 1900  Gross per 24 hour  Intake                0 ml  Output             2500 ml  Net            -2500 ml   Filed Weights   09/15/16 1550 09/15/16 1900 09/16/16 0500  Weight: 65.6 kg (144 lb 10 oz) 63.1 kg (139 lb 1.8 oz) 63 kg (139 lb)    Exam  General: Well developed,thin, NAD, appears stated age  HEENT: NCAT, mucous membranes moist.   Cardiovascular: S1 S2 auscultated, 3/6 SEM, RRR  Respiratory: Diminished breath sounds  Abdomen: Soft, nontender, nondistended, + bowel sounds  Extremities: warm dry without cyanosis clubbing or edema  Neuro: AAOx3, nonfocal  Psych: Appropriate mood and affect   Data Reviewed: I have personally reviewed following labs and imaging studies  CBC:  Recent Labs Lab 09/12/16 0548 09/13/16 0554 09/14/16 0313 09/14/16 1615 09/15/16 0309  WBC 8.8 14.0* 17.0* 18.2* 13.6*  HGB 10.7* 10.0* 9.5* 10.1* 11.2*  HCT 31.6* 28.6* 26.8* 28.6* 31.5*  MCV 84.7 82.2 81.2 80.8 80.6  PLT 235 257 326 381 638   Basic Metabolic Panel:  Recent Labs Lab 09/10/16 0253  09/11/16 0551  09/12/16 0548  09/13/16 0012 09/13/16 0554 09/14/16 0313 09/14/16 1615 09/14/16 1616 09/15/16 0309  NA 140  --  139  --  137  --   --  132* 133*  --  134* 133*  K 3.4*  < > 3.9  < > 4.5  < > 4.5 4.1 4.2  --  4.3 3.8  CL 105  --  105  --  102  --   --  97* 96*  --  95* 95*  CO2 19*  --  21*  --  17*  --   --  16* 17*  --  16* 20*  GLUCOSE 81  --  93  --  75  --   --  86 61*  --  95 82  BUN 113*  --  115*  --  131*  --   --  142* 161*  --  176* 108*  CREATININE 8.29*  --  8.44*  --  8.85*  --   --  8.58* 9.64*  --  10.38* 7.73*  CALCIUM 7.9*  --  8.1*  --  7.9*  --   --  8.4* 8.6* 8.4* 8.7* 8.7*  MG 2.1  --  2.2  --   --   --   --    --   --   --   --   --   PHOS 6.9*  --  8.4*  --   --   --   --   --  11.6*  --  11.5*  --   < > = values in this interval not displayed. GFR: Estimated Creatinine Clearance: 9.6 mL/min (by C-G formula based on SCr of 7.73 mg/dL (H)).  Liver Function Tests:  Recent Labs Lab 09/10/16 0253 09/14/16 1616  AST 34  --   ALT 30  --   ALKPHOS 59  --   BILITOT 0.3  --   PROT 5.8*  --   ALBUMIN 2.8* 2.5*   No results for input(s): LIPASE, AMYLASE in the last 168 hours. No results for input(s): AMMONIA in the last 168 hours. Coagulation Profile: No results for input(s): INR, PROTIME in the last 168 hours. Cardiac Enzymes:  Recent Labs Lab 09/09/16 1439 09/09/16 2123  TROPONINI 0.41* 0.33*   BNP (last 3 results) No results for input(s): PROBNP in the last 8760 hours. HbA1C: No results for input(s): HGBA1C in the last 72 hours. CBG:  Recent Labs Lab 09/14/16 0512 09/14/16 0656 09/14/16 0742 09/14/16 2119 09/15/16 0800  GLUCAP 64* 71 68 83 94   Lipid Profile: No results for input(s): CHOL, HDL, LDLCALC, TRIG, CHOLHDL, LDLDIRECT in the last 72 hours. Thyroid Function Tests: No results for input(s): TSH, T4TOTAL, FREET4, T3FREE, THYROIDAB in the last 72 hours. Anemia Panel:  Recent Labs  09/14/16 1615  TIBC 225*  IRON 77   Urine analysis:    Component Value Date/Time   COLORURINE YELLOW 09/13/2016 1100   APPEARANCEUR CLEAR 09/13/2016 1100   LABSPEC 1.011 09/13/2016 1100   PHURINE 5.0 09/13/2016 1100   GLUCOSEU NEGATIVE 09/13/2016 1100   HGBUR MODERATE (A) 09/13/2016 1100   BILIRUBINUR NEGATIVE 09/13/2016 1100   KETONESUR NEGATIVE 09/13/2016 1100   PROTEINUR 100 (A) 09/13/2016 1100   NITRITE NEGATIVE 09/13/2016 1100   LEUKOCYTESUR NEGATIVE 09/13/2016 1100   Sepsis Labs: @LABRCNTIP (procalcitonin:4,lacticidven:4)  ) Recent Results (from the past 240 hour(s))  Culture, blood (routine x 2)     Status: None   Collection Time: 09/09/16  9:55 AM  Result Value  Ref Range Status   Specimen Description BLOOD RIGHT HAND  Final   Special Requests BOTTLES DRAWN AEROBIC AND ANAEROBIC  10CC  Final   Culture NO GROWTH 5 DAYS  Final   Report Status 09/14/2016 FINAL  Final  Culture, blood (routine x 2)     Status: None   Collection Time: 09/09/16  9:58 AM  Result Value Ref Range Status   Specimen Description BLOOD LEFT HAND  Final   Special Requests BOTTLES DRAWN AEROBIC AND ANAEROBIC  5CC  Final   Culture NO GROWTH 5 DAYS  Final   Report Status 09/14/2016 FINAL  Final  MRSA PCR Screening     Status: None   Collection Time: 09/09/16 10:52 AM  Result Value Ref Range Status   MRSA by PCR NEGATIVE NEGATIVE Final    Comment:        The GeneXpert MRSA Assay (FDA approved for NASAL specimens only), is one component of a comprehensive MRSA colonization surveillance program. It is not intended to diagnose MRSA infection nor to guide or monitor treatment for MRSA infections.       Radiology Studies: Ir Fluoro Guide Cv Line Right  Result Date: 09/14/2016 INDICATION: 55 year old male with a history of acute kidney insufficiency superimposed on chronic kidney disease. EXAM: TUNNELED CENTRAL VENOUS HEMODIALYSIS CATHETER PLACEMENT WITH ULTRASOUND AND FLUOROSCOPIC GUIDANCE MEDICATIONS: None ANESTHESIA/SEDATION: None FLUOROSCOPY TIME:  Fluoroscopy Time: 0 minutes 6 seconds (1 mGy). COMPLICATIONS: None PROCEDURE: Informed written consent was obtained from the patient and the patient's family after a thorough discussion of the procedural risks, benefits and alternatives. All questions were addressed. A timeout was performed prior to the initiation of the procedure. The right neck  and chest was prepped with chlorhexidine, and draped in the usual sterile fashion using maximum barrier technique (cap and mask, sterile gown, sterile gloves, large sterile sheet, hand hygiene and cutaneous antiseptic). Local anesthesia was attained by infiltration with 1% lidocaine without  epinephrine. Ultrasound demonstrated patency of the right internal jugular vein, and this was documented with an image. Under real-time ultrasound guidance, this vein was accessed with a 21 gauge micropuncture needle and image documentation was performed. A small dermatotomy was made at the access site with an 11 scalpel. A 0.018" wire was advanced into the SVC and the access needle exchanged for a 53F micropuncture vascular sheath. The 0.018" wire was then removed and a 0.035" wire advanced into the IVC. Upon withdrawal of the 018 wire, the wire was marked for appropriate length of the internal portion of the catheter. A 19 cm catheter was selected. Skin and subcutaneous tissues were serially dilated. Catheter was placed on the wire. The catheter tip is positioned in the upper right atrium. This was documented with a spot image. Both ports of the hemodialysis catheter were then tested for excellent function. The ports were then locked with heparinized lock. Patient tolerated the procedure well and remained hemodynamically stable throughout. No complications were encountered and no significant blood loss was encountered. IMPRESSION: Status post right IJ approach temporary HD catheter placement. Catheter ready for use. Signed, Dulcy Fanny. Earleen Newport, DO Vascular and Interventional Radiology Specialists [facility] Radiology Electronically Signed   By: [name] D.O.   On: 09/14/2016 12:49   Ir US Guide Vasc Access Right  Result Date: 09/14/2016 INDICATION: 55 year old male with a history of acute kidney insufficiency superimposed on chronic kidney disease. EXAM: TUNNELED CENTRAL VENOUS HEMODIALYSIS CATHETER PLACEMENT WITH ULTRASOUND AND FLUOROSCOPIC GUIDANCE MEDICATIONS: None ANESTHESIA/SEDATION: None FLUOROSCOPY TIME:  Fluoroscopy Time: 0 minutes 6 seconds (1 mGy). COMPLICATIONS: None PROCEDURE: Informed written consent was obtained from the patient and the patient's family after a thorough discussion of the  procedural risks, benefits and alternatives. All questions were addressed. A timeout was performed prior to the initiation of the procedure. The right neck and chest was prepped with chlorhexidine, and draped in the usual sterile fashion using maximum barrier technique (cap and mask, sterile gown, sterile gloves, large sterile sheet, hand hygiene and cutaneous antiseptic). Local anesthesia was attained by infiltration with 1% lidocaine without epinephrine. Ultrasound demonstrated patency of the right internal jugular vein, and this was documented with an image. Under real-time ultrasound guidance, this vein was accessed with a 21 gauge micropuncture needle and image documentation was performed. A small dermatotomy was made at the access site with an 11 scalpel. A 0.018" wire was advanced into the SVC and the access needle exchanged for a 53F micropuncture vascular sheath. The 0.018" wire was then removed and a 0.035" wire advanced into the IVC. Upon withdrawal of the 018 wire, the wire was marked for appropriate length of the internal portion of the catheter. A 19 cm catheter was selected. Skin and subcutaneous tissues were serially dilated. Catheter was placed on the wire. The catheter tip is positioned in the upper right atrium. This was documented with a spot image. Both ports of the hemodialysis catheter were then tested for excellent function. The ports were then locked with heparinized lock. Patient tolerated the procedure well and remained hemodynamically stable throughout. No complications were encountered and no significant blood loss was encountered. IMPRESSION: Status post right IJ approach temporary HD catheter placement. Catheter ready for use. Signed, Dulcy Fanny. Earleen Newport,  DO Vascular and Interventional Radiology Specialists [facility] Radiology Electronically Signed   By: [name] D.O.   On: 09/14/2016 12:49     Scheduled Meds: . amLODipine  10 mg Oral QHS  . calcitRIOL  0.5 mcg Oral QODAY  .  calcium acetate  1,334 mg Oral TID WC  . feeding supplement (NEPRO CARB STEADY)  237 mL Oral TID BM  . heparin subcutaneous  5,000 Units Subcutaneous Q8H  . Influenza vac split quadrivalent PF  0.5 mL Intramuscular Tomorrow-1000  . isosorbide-hydrALAZINE  1 tablet Oral TID  . labetalol  100 mg Oral TID  . LORazepam  0.5 mg Oral BID  . multivitamin  1 tablet Oral QHS  . pneumococcal 23 valent vaccine  0.5 mL Intramuscular Tomorrow-1000  . tamsulosin  0.4 mg Oral Daily   Continuous Infusions:   LOS: 7 days   Time Spent in minutes   30 minutes  Cordero Surette D.O. on 09/16/2016 at 11:26 AM  Between 7am to 7pm - Pager - [phone]  After 7pm go to [email or web address] - password [email or web address]  And look for the night coverage person covering for me after hours  [facility] Office  [phone]

## 2016-09-17 DIAGNOSIS — D62 Acute posthemorrhagic anemia: Secondary | ICD-10-CM

## 2016-09-17 LAB — BASIC METABOLIC PANEL
Anion gap: 14 (ref 5–15)
BUN: 67 mg/dL — AB (ref 6–20)
CALCIUM: 9.2 mg/dL (ref 8.9–10.3)
CO2: 21 mmol/L — ABNORMAL LOW (ref 22–32)
CREATININE: 6.67 mg/dL — AB (ref 0.61–1.24)
Chloride: 96 mmol/L — ABNORMAL LOW (ref 101–111)
GFR calc Af Amer: 10 mL/min — ABNORMAL LOW (ref >60)
GFR, EST NON AFRICAN AMERICAN: 8 mL/min — AB (ref >60)
Glucose, Bld: 110 mg/dL — ABNORMAL HIGH (ref 65–99)
Potassium: 3.6 mmol/L (ref 3.5–5.1)
SODIUM: 131 mmol/L — AB (ref 135–145)

## 2016-09-17 LAB — CBC
HCT: 36.1 % — ABNORMAL LOW (ref 39.0–52.0)
Hemoglobin: 12.6 g/dL — ABNORMAL LOW (ref 13.0–17.0)
MCH: 28.6 pg (ref 26.0–34.0)
MCHC: 34.9 g/dL (ref 30.0–36.0)
MCV: 82 fL (ref 78.0–100.0)
PLATELETS: 362 10*3/uL (ref 150–400)
RBC: 4.4 MIL/uL (ref 4.22–5.81)
RDW: 12.7 % (ref 11.5–15.5)
WBC: 10.4 10*3/uL (ref 4.0–10.5)

## 2016-09-17 MED ORDER — PNEUMOCOCCAL VAC POLYVALENT 25 MCG/0.5ML IJ INJ: 0.5000 mL | INJECTION | INTRAMUSCULAR | Status: AC | PRN

## 2016-09-17 MED ORDER — CARVEDILOL 25 MG PO TABS
25.0000 mg | ORAL_TABLET | Freq: Two times a day (BID) | ORAL | Status: DC
  Administered 2016-09-17 – 2016-09-21 (×7): 25 mg via ORAL
  Filled 2016-09-17 (×7): qty 1

## 2016-09-17 MED ORDER — KIDNEY FAILURE BOOK
Freq: Once | Status: AC
  Administered 2016-09-17: 09:00:00
  Filled 2016-09-17: qty 1

## 2016-09-17 MED ORDER — INFLUENZA VAC SPLIT QUAD 0.5 ML IM SUSY: 0.5000 mL | PREFILLED_SYRINGE | INTRAMUSCULAR | Status: AC | PRN

## 2016-09-17 NOTE — Progress Notes (Signed)
Subjective:  Interval History: has complaints had some cramping yest.  Objective: Vital signs in last 24 hours: Temp:  [97.7 F (36.5 C)-99.1 F (37.3 C)] 98.2 F (36.8 C) (12/03 0943) Pulse Rate:  [68-95] 86 (12/03 0943) Resp:  [16-23] 18 (12/03 0943) BP: (134-187)/(98-126) 157/104 (12/03 0943) SpO2:  [95 %-99 %] 97 % (12/03 0943) Weight:  [60.8 kg (134 lb)-63.8 kg (140 lb 10.5 oz)] 60.8 kg (134 lb) (12/02 2200) Weight change: -1.8 kg (-3 lb 15.5 oz)  Intake/Output from previous day: 12/02 0701 - 12/03 0700 In: 680 [P.O.:680] Out: 1961  Intake/Output this shift: Total I/O In: 120 [P.O.:120] Out: 0   General appearance: alert, cooperative and no distress Neck: RIJ cath Resp: decreased bs, occ rhonchi Cardio: S1, S2 normal and systolic murmur: holosystolic 2/6, blowing at apex GI: soft, liver down 4 cm, pos bs Extremities: extremities normal, atraumatic, no cyanosis or edema  Lab Results:  Recent Labs  09/16/16 1603 09/17/16 0531  WBC 9.7 10.4  HGB 11.8* 12.6*  HCT 33.9* 36.1*  PLT 405* 362   BMET:  Recent Labs  09/16/16 1603 09/17/16 0531  NA 130* 131*  K 3.4* 3.6  CL 92* 96*  CO2 23 21*  GLUCOSE 109* 110*  BUN 84* 67*  CREATININE 7.21* 6.67*  CALCIUM 9.1 9.2    Recent Labs  09/14/16 1615  PTH 241*  Comment   Iron Studies:  Recent Labs  09/14/16 1615  IRON 77  TIBC 225*    Studies/Results: No results found.  I have reviewed the patient's current medications.  Assessment/Plan: 1 ESRD for HD on Tues.  To get perm access 2 Anemia stable 3 HPTH vit D 4 ^ phos on binders 5 HTN use carvedilol stop labet 6 Substance abuse P educate, Hd Tues, access, carved    LOS: 8 days   Brandi Armato L 09/17/2016,11:40 AM

## 2016-09-17 NOTE — Progress Notes (Signed)
PROGRESS NOTE    Timothy [name]  YHC:[id number] DOB: 1961/05/13 DOA: 09/09/2016 PCP: No PCP Per Patient   Chief Complaint  Patient presents with  . Shortness of Breath  . took unknown pill    Brief Narrative:  HPI on 09/09/2016 by Dr. Rush [facility]) [phone] male with hx dCHF, colon cancer, uncontrolled HTN, CKD, polysubstance abuse presented 11/25 with SOB, chest pain after taking unknown "pain pill" that he bought off the street.  In ER had HTN with SBP 200's despite nitro gtt, acute on chronic renal failure with Scr 8.5, BNP 1607, metabolic acidosis.  He had waxing and waning mental status and worsening SOB despite bipap and was intubated in ER.  Started on cardene and propofol gtt with slow improvement in BP.  PCCM called to admit.   Assessment & Plan   Hypertensive emergency -Patient required Cardene drip -Currently on amlodipine, labetalol, bidil, as needed hydralazine  Acute respiratory failure with hypoxia and pulmonary edema secondary to Acute diastolic heart failure and worsening renal function -Noted to also have been in hypertensive crisis upon admission -Improving -Patient did require intubation -Echocardiogram showed EF of 37-10%  Acute metabolic encephalopathy -Likely multifactorial related to recent unknown medications presumed to be narcotic (responded briefly to Narcan), hypoxemia, uremia, and possible PRES -CT head showed no acute findings -Patient appears to be back to baseline mentation  Elevated troponin -Echocardiogram as above -Likely secondary to demand ischemia in the setting of severe hypertension, respiratory distress and worsening kidney failure  Acute renal failure superimposed on stage 5 chronic kidney disease, progressing -Baseline creatinine in 2016 was 1.5 -Upon admission, creatinine 8.5, BUN of 109 -Currently creatinine 6.67 -Nephrology consulted and appreciated, started HD -Vein mapping ordered -Plan for vascular access on  09/19/16 -Patient feels that too much fluid is being removed  Heroin abuse/Polysubstance abuse -Upon admission, patient was lethargic and unable to provide adequate history regarding current use of hair when and when last used -Urine drug screen positive for opiates and THC -Monitor for withdrawal symptoms  Nonadherence to medical treatment -Unable to address at this juncture due to patient's metabolic encephalopathy and inability to participate with history  Anemia due to chronic kidney disease -Hemoglobin close to baseline, currently 12.6 -Continue to monitor CBC   Hepatitis C antibody positive -Patient will need outpatient follow-up  Physical deconditioning -PT recommended CIR -Inpatient rehabilitation consulted  DVT Prophylaxis  heparin  Code Status: Full  Family Communication: Family at bedside  Disposition Plan: Admitted. Plan for vascular access 12/5. Possibly CIR thereafter  Consultants Nephrology PCCM Interventional radiology PMR Vascular surgery  Procedures/ Significant Events 11/25  Admit with hypertensive crisis, acute on chronic renal failure, resp fx requiring intubation.  11/27  Remains on cardene gtt, intermittent agitation  11/24  Echocardiogram: moderate LVH, EF 60-65%, mild MR, patent PFO can not be excluded  11/28  Extubated 11/29  Remains on cardene, tolerated extubation  11/30  Placement of right IJ temp HD cath  Antibiotics   Anti-infectives    Start     Dose/Rate Route Frequency Ordered Stop   09/12/16 1030  levofloxacin (LEVAQUIN) IVPB 500 mg  Status:  Discontinued     500 mg 100 mL/hr over 60 Minutes Intravenous Every 48 hours 09/12/16 0954 09/13/16 1427      Subjective:   Timothy [name] seen and examined today.  Patient feeling better this morning.  Feels that too much fluid is being removed and states he does not want to dialyze for  4-5 hours at a time. Denies chest pain, shortness of breath, abdominal pain.   Objective:    Vitals:   09/16/16 2200 09/17/16 0522 09/17/16 0647 09/17/16 0943  BP: (!) 145/103 (!) 157/104 (!) 172/102 (!) 157/104  Pulse: 86 81 91 86  Resp: 18 16  18   Temp: 99.1 F (37.3 C) 98 F (36.7 C)  98.2 F (36.8 C)  TempSrc: Oral Oral  Oral  SpO2: 99% 99%  97%  Weight: 60.8 kg (134 lb)     Height:        Intake/Output Summary (Last 24 hours) at 09/17/16 0956 Last data filed at 09/17/16 0640  Gross per 24 hour  Intake              680 ml  Output             1961 ml  Net            -1281 ml   Filed Weights   09/16/16 1154 09/16/16 1506 09/16/16 2200  Weight: 63.8 kg (140 lb 10.5 oz) 61.4 kg (135 lb 5.8 oz) 60.8 kg (134 lb)    Exam  General: Well developed,thin, NAD, appears stated age  HEENT: NCAT, mucous membranes moist.   Cardiovascular: S1 S2 auscultated, 3/6 SEM, RRR  Respiratory: Diminished breath sounds  Abdomen: Soft, nontender, nondistended, + bowel sounds  Extremities: warm dry without cyanosis clubbing or edema  Neuro: AAOx3, nonfocal  Psych: Appropriate mood and affect   Data Reviewed: I have personally reviewed following labs and imaging studies  CBC:  Recent Labs Lab 09/14/16 0313 09/14/16 1615 09/15/16 0309 09/16/16 1603 09/17/16 0531  WBC 17.0* 18.2* 13.6* 9.7 10.4  HGB 9.5* 10.1* 11.2* 11.8* 12.6*  HCT 26.8* 28.6* 31.5* 33.9* 36.1*  MCV 81.2 80.8 80.6 81.9 82.0  PLT 326 381 399 405* 517   Basic Metabolic Panel:  Recent Labs Lab 09/11/16 0551  09/14/16 0313 09/14/16 1615 09/14/16 1616 09/15/16 0309 09/16/16 1603 09/17/16 0531  NA 139  < > 133*  --  134* 133* 130* 131*  K 3.9  < > 4.2  --  4.3 3.8 3.4* 3.6  CL 105  < > 96*  --  95* 95* 92* 96*  CO2 21*  < > 17*  --  16* 20* 23 21*  GLUCOSE 93  < > 61*  --  95 82 109* 110*  BUN 115*  < > 161*  --  176* 108* 84* 67*  CREATININE 8.44*  < > 9.64*  --  10.38* 7.73* 7.21* 6.67*  CALCIUM 8.1*  < > 8.6* 8.4* 8.7* 8.7* 9.1 9.2  MG 2.2  --   --   --   --   --   --   --   PHOS 8.4*   --  11.6*  --  11.5*  --  6.4*  --   < > = values in this interval not displayed. GFR: Estimated Creatinine Clearance: 10.8 mL/min (by C-G formula based on SCr of 6.67 mg/dL (H)). Liver Function Tests:  Recent Labs Lab 09/14/16 1616 09/16/16 1603  ALBUMIN 2.5* 2.7*   No results for input(s): LIPASE, AMYLASE in the last 168 hours. No results for input(s): AMMONIA in the last 168 hours. Coagulation Profile: No results for input(s): INR, PROTIME in the last 168 hours. Cardiac Enzymes: No results for input(s): CKTOTAL, CKMB, CKMBINDEX, TROPONINI in the last 168 hours. BNP (last 3 results) No results for input(s): PROBNP in the last 8760 hours.  HbA1C: No results for input(s): HGBA1C in the last 72 hours. CBG:  Recent Labs Lab 09/14/16 0656 09/14/16 0742 09/14/16 2119 09/15/16 0800 09/16/16 1945  GLUCAP 71 68 83 94 160*   Lipid Profile: No results for input(s): CHOL, HDL, LDLCALC, TRIG, CHOLHDL, LDLDIRECT in the last 72 hours. Thyroid Function Tests: No results for input(s): TSH, T4TOTAL, FREET4, T3FREE, THYROIDAB in the last 72 hours. Anemia Panel:  Recent Labs  09/14/16 1615  TIBC 225*  IRON 77   Urine analysis:    Component Value Date/Time   COLORURINE YELLOW 09/13/2016 1100   APPEARANCEUR CLEAR 09/13/2016 1100   LABSPEC 1.011 09/13/2016 1100   PHURINE 5.0 09/13/2016 1100   GLUCOSEU NEGATIVE 09/13/2016 1100   HGBUR MODERATE (A) 09/13/2016 1100   BILIRUBINUR NEGATIVE 09/13/2016 1100   KETONESUR NEGATIVE 09/13/2016 1100   PROTEINUR 100 (A) 09/13/2016 1100   NITRITE NEGATIVE 09/13/2016 1100   LEUKOCYTESUR NEGATIVE 09/13/2016 1100   Sepsis Labs: @LABRCNTIP (procalcitonin:4,lacticidven:4)  ) Recent Results (from the past 240 hour(s))  Culture, blood (routine x 2)     Status: None   Collection Time: 09/09/16  9:55 AM  Result Value Ref Range Status   Specimen Description BLOOD RIGHT HAND  Final   Special Requests BOTTLES DRAWN AEROBIC AND ANAEROBIC  10CC   Final   Culture NO GROWTH 5 DAYS  Final   Report Status 09/14/2016 FINAL  Final  Culture, blood (routine x 2)     Status: None   Collection Time: 09/09/16  9:58 AM  Result Value Ref Range Status   Specimen Description BLOOD LEFT HAND  Final   Special Requests BOTTLES DRAWN AEROBIC AND ANAEROBIC  5CC  Final   Culture NO GROWTH 5 DAYS  Final   Report Status 09/14/2016 FINAL  Final  MRSA PCR Screening     Status: None   Collection Time: 09/09/16 10:52 AM  Result Value Ref Range Status   MRSA by PCR NEGATIVE NEGATIVE Final    Comment:        The GeneXpert MRSA Assay (FDA approved for NASAL specimens only), is one component of a comprehensive MRSA colonization surveillance program. It is not intended to diagnose MRSA infection nor to guide or monitor treatment for MRSA infections.       Radiology Studies: No results found.   Scheduled Meds: . amLODipine  10 mg Oral QHS  . calcitRIOL  0.5 mcg Oral QODAY  . calcium acetate  1,334 mg Oral TID WC  . feeding supplement (NEPRO CARB STEADY)  237 mL Oral TID BM  . heparin subcutaneous  5,000 Units Subcutaneous Q8H  . isosorbide-hydrALAZINE  1 tablet Oral TID  . labetalol  100 mg Oral TID  . LORazepam  0.5 mg Oral BID  . multivitamin  1 tablet Oral QHS  . tamsulosin  0.4 mg Oral Daily   Continuous Infusions:   LOS: 8 days   Time Spent in minutes   30 minutes  Kahlil Cowans D.O. on 09/17/2016 at 9:56 AM  Between 7am to 7pm - Pager - [phone]  After 7pm go to [email or web address] - password [email or web address]  And look for the night coverage person covering for me after hours  [facility] Office  [phone]

## 2016-09-18 LAB — CBC
HEMATOCRIT: 33.6 % — AB (ref 39.0–52.0)
Hemoglobin: 11.6 g/dL — ABNORMAL LOW (ref 13.0–17.0)
MCH: 28.3 pg (ref 26.0–34.0)
MCHC: 34.5 g/dL (ref 30.0–36.0)
MCV: 82 fL (ref 78.0–100.0)
PLATELETS: 320 10*3/uL (ref 150–400)
RBC: 4.1 MIL/uL — AB (ref 4.22–5.81)
RDW: 12.8 % (ref 11.5–15.5)
WBC: 12.2 10*3/uL — AB (ref 4.0–10.5)

## 2016-09-18 LAB — BASIC METABOLIC PANEL
ANION GAP: 17 — AB (ref 5–15)
BUN: 92 mg/dL — ABNORMAL HIGH (ref 6–20)
CALCIUM: 8.9 mg/dL (ref 8.9–10.3)
CO2: 17 mmol/L — ABNORMAL LOW (ref 22–32)
Chloride: 95 mmol/L — ABNORMAL LOW (ref 101–111)
Creatinine, Ser: 8.16 mg/dL — ABNORMAL HIGH (ref 0.61–1.24)
GFR, EST AFRICAN AMERICAN: 8 mL/min — AB (ref >60)
GFR, EST NON AFRICAN AMERICAN: 7 mL/min — AB (ref >60)
GLUCOSE: 81 mg/dL (ref 65–99)
POTASSIUM: 4.5 mmol/L (ref 3.5–5.1)
Sodium: 129 mmol/L — ABNORMAL LOW (ref 135–145)

## 2016-09-18 MED ORDER — DEXTROSE 5 % IV SOLN
1.5000 g | INTRAVENOUS | Status: AC
  Administered 2016-09-19: 1.5 g via INTRAVENOUS
  Filled 2016-09-18 (×2): qty 1.5

## 2016-09-18 NOTE — Progress Notes (Signed)
Physical Therapy Treatment Patient Details Name: Timothy [name] MRN: [id number] DOB: 1961/09/07 Today's Date: 09/18/2016    History of Present Illness 55yo male with hx dCHF, colon cancer, uncontrolled HTN, CKD, polysubstance abuse presented 11/25 with SOB, chest pain after taking unknown "pain pill" that he bought off the street.  In ER had HTN with SBP 200's despite nitro gtt, acute on chronic renal failure with Scr 8.5, BNP 3235, metabolic acidosis.  He had waxing and waning mental status and worsening SOB despite bipap and was intubated in ER.  Started on cardene and propofol gtt with slow improvement in BP.     PT Comments    Pt has progressed considerably since last visit, has been ambulating in hallway with family and ambulated 140' with RW today with min-guard A. He does continue to have a high fall risk with decreased insight into limitations as well as slightly ataxic gait pattern with narrow BOS. Recommend HHPT for balance. PT will continue to follow.   Follow Up Recommendations  Home health PT;Supervision - Intermittent     Equipment Recommendations  Rolling walker with 5" wheels    Recommendations for Other Services       Precautions / Restrictions Precautions Precautions: Fall Restrictions Weight Bearing Restrictions: No    Mobility  Bed Mobility Overal bed mobility: Modified Independent Bed Mobility: Supine to Sit;Sit to Supine     Supine to sit: Modified independent (Device/Increase time) Sit to supine: Modified independent (Device/Increase time)   General bed mobility comments: getting in and out of bed independently  Transfers Overall transfer level: Needs assistance Equipment used: None Transfers: Sit to/from Stand Sit to Stand: Min guard         General transfer comment: no physical assist needed to stand but pt with decreased proprioception and awareness and min-guard needed for safety  Ambulation/Gait Ambulation/Gait assistance: Min  guard Ambulation Distance (Feet): 140 Feet Assistive device: Rolling walker (2 wheeled) Gait Pattern/deviations: Narrow base of support;Scissoring;Ataxic;Step-through pattern Gait velocity: decreased Gait velocity interpretation: Below normal speed for age/gender General Gait Details: pt at first felt he did not need AD to ambulate but even in static standing needed to sit after one minute due to fatigue. RW used but pt demonstrates some unsafe behavior with it such as lifting it off floor and spinning around 180 degrees before setting it back down. Worked on widening BOS but pt unable to do this with vc's.    Stairs            Wheelchair Mobility    Modified Rankin (Stroke Patients Only)       Balance Overall balance assessment: Needs assistance Sitting-balance support: Feet supported Sitting balance-Leahy Scale: Good Sitting balance - Comments: maintaining balance EOB   Standing balance support: No upper extremity supported Standing balance-Leahy Scale: Poor Standing balance comment: increased sway in standing as well as knee flexion                    Cognition Arousal/Alertness: Awake/alert Behavior During Therapy: Flat affect;Impulsive Overall Cognitive Status: No family/caregiver present to determine baseline cognitive functioning Area of Impairment: Following commands       Following Commands: Follows multi-step commands with increased time;Follows multi-step commands inconsistently Safety/Judgement: Decreased awareness of deficits   Problem Solving: Slow processing;Decreased initiation;Difficulty sequencing;Requires verbal cues General Comments: continues to be impulsive and unaware of safety deficits while ambulating    Exercises Total Joint Exercises Bridges: 10 reps;Supine General Exercises - Lower Extremity Mini-Sqauts: 10 reps;Standing  General Comments General comments (skin integrity, edema, etc.): pt reports he cannot tolerate HD and  doesn't want to do it anymore.       Pertinent Vitals/Pain Pain Assessment: Faces Faces Pain Scale: Hurts even more Pain Location: headache Pain Descriptors / Indicators: Aching Pain Intervention(s): Limited activity within patient's tolerance;Monitored during session;Premedicated before session    Home Living                      Prior Function            PT Goals (current goals can now be found in the care plan section) Acute Rehab PT Goals Patient Stated Goal: leave the hospital PT Goal Formulation: With patient Time For Goal Achievement: 09/20/16 Potential to Achieve Goals: Good Progress towards PT goals: Progressing toward goals    Frequency    Min 3X/week      PT Plan Discharge plan needs to be updated;Equipment recommendations need to be updated;Frequency needs to be updated    Co-evaluation             End of Session Equipment Utilized During Treatment: Gait belt Activity Tolerance: Patient limited by fatigue Patient left: in bed;with call bell/phone within reach     Time: 1134-1149 PT Time Calculation (min) (ACUTE ONLY): 15 min  Charges:  $Gait Training: 8-22 mins                    G Codes:     Leighton Roach, PT  Acute Rehab Services  [place] 09/18/2016, 1:31 PM

## 2016-09-18 NOTE — Progress Notes (Addendum)
Noted pt has progressed well with therapy. No longer in need of an inpt rehab admission at this level. Home Health is recommended. We will sign off. RN CM and SW made aware.[phone]

## 2016-09-18 NOTE — Progress Notes (Signed)
PROGRESS NOTE    Timothy [name]  EXB:[id number] DOB: March 20, 1961 DOA: 09/09/2016 PCP: No PCP Per Patient   Chief Complaint  Patient presents with  . Shortness of Breath  . took unknown pill    Brief Narrative:  HPI on 09/09/2016 by Dr. [facility]) [phone] male with hx dCHF, colon cancer, uncontrolled HTN, CKD, polysubstance abuse presented 11/25 with SOB, chest pain after taking unknown "pain pill" that he bought off the street.  In ER had HTN with SBP 200's despite nitro gtt, acute on chronic renal failure with Scr 8.5, BNP 2536, metabolic acidosis.  He had waxing and waning mental status and worsening SOB despite bipap and was intubated in ER.  Started on cardene and propofol gtt with slow improvement in BP.  PCCM called to admit.   Assessment & Plan   Hypertensive emergency -Patient required Cardene drip -Currently on amlodipine, labetalol, bidil, as needed hydralazine -BP better today, 141/95  Acute respiratory failure with hypoxia and pulmonary edema secondary to Acute diastolic heart failure and worsening renal function -Noted to also have been in hypertensive crisis upon admission -Improving -Patient did require intubation -Echocardiogram showed EF of 64-40%  Acute metabolic encephalopathy -Likely multifactorial related to recent unknown medications presumed to be narcotic (responded briefly to Narcan), hypoxemia, uremia, and possible PRES -CT head showed no acute findings -Patient appears to be back to baseline mentation  Elevated troponin -Echocardiogram as above -Likely secondary to demand ischemia in the setting of severe hypertension, respiratory distress and worsening kidney failure  Acute renal failure superimposed on stage 5 chronic kidney disease, progressing -Baseline creatinine in 2016 was 1.5 -Upon admission, creatinine 8.5, BUN of 109 -Currently creatinine 6.67 -Nephrology consulted and appreciated, started HD -Vein mapping ordered -Plan  for vascular access on 09/19/16 -Patient feels that too much fluid is being removed and does not want to dialysis tomorrow  Heroin abuse/Polysubstance abuse -Upon admission, patient was lethargic and unable to provide adequate history regarding current use of hair when and when last used -Urine drug screen positive for opiates and THC -Monitor for withdrawal symptoms  Nonadherence to medical treatment -Unable to address at this juncture due to patient's metabolic encephalopathy and inability to participate with history  Anemia due to chronic kidney disease -Hemoglobin close to baseline, currently 11.6 -Continue to monitor CBC   Hepatitis C antibody positive -Patient will need outpatient follow-up  Physical deconditioning -PT recommended CIR -Inpatient rehabilitation consulted -Patient wishes to go home. Will ask for PT to reevaulate patient   DVT Prophylaxis  heparin  Code Status: Full  Family Communication: Family at bedside  Disposition Plan: Admitted. Plan for vascular access 12/5. Possibly CIR thereafter  Consultants Nephrology PCCM Interventional radiology PMR Vascular surgery  Procedures/ Significant Events 11/25  Admit with hypertensive crisis, acute on chronic renal failure, resp fx requiring intubation.  11/27  Remains on cardene gtt, intermittent agitation  11/24  Echocardiogram: moderate LVH, EF 60-65%, mild MR, patent PFO can not be excluded  11/28  Extubated 11/29  Remains on cardene, tolerated extubation  11/30  Placement of right IJ temp HD cath  Antibiotics   Anti-infectives    Start     Dose/Rate Route Frequency Ordered Stop   09/12/16 1030  levofloxacin (LEVAQUIN) IVPB 500 mg  Status:  Discontinued     500 mg 100 mL/hr over 60 Minutes Intravenous Every 48 hours 09/12/16 0954 09/13/16 1427      Subjective:   [name] seen and examined today.  Patient states he did not sleep well last night.  Feels dry and does not want dialysis  tomorrow.  Wishes to go home. Denies chest pain, shortness of breath, abdominal pain.   Objective:   Vitals:   09/17/16 1648 09/17/16 2157 09/18/16 0411 09/18/16 0906  BP: 131/89 (!) 141/98 (!) 149/92 (!) 141/95  Pulse: 82 72 73 75  Resp: 18 16 16 17   Temp: 98.8 F (37.1 C) 98.2 F (36.8 C) 98.3 F (36.8 C) 97.9 F (36.6 C)  TempSrc: Oral Oral Oral Oral  SpO2: 98% 99% 96% 98%  Weight:  63.6 kg (140 lb 3.2 oz)    Height:        Intake/Output Summary (Last 24 hours) at 09/18/16 1040 Last data filed at 09/18/16 0930  Gross per 24 hour  Intake             1360 ml  Output              250 ml  Net             1110 ml   Filed Weights   09/16/16 1506 09/16/16 2200 09/17/16 2157  Weight: 61.4 kg (135 lb 5.8 oz) 60.8 kg (134 lb) 63.6 kg (140 lb 3.2 oz)    Exam (no change)  General: Well developed,thin, NAD, appears stated age  55: NCAT, mucous membranes moist.   Cardiovascular: S1 S2 auscultated, 3/6 SEM, RRR  Respiratory: Diminished breath sounds  Abdomen: Soft, nontender, nondistended, + bowel sounds  Extremities: warm dry without cyanosis clubbing or edema  Neuro: AAOx3, nonfocal  Psych: Appropriate mood and affect   Data Reviewed: I have personally reviewed following labs and imaging studies  CBC:  Recent Labs Lab 09/14/16 1615 09/15/16 0309 09/16/16 1603 09/17/16 0531 09/18/16 0613  WBC 18.2* 13.6* 9.7 10.4 12.2*  HGB 10.1* 11.2* 11.8* 12.6* 11.6*  HCT 28.6* 31.5* 33.9* 36.1* 33.6*  MCV 80.8 80.6 81.9 82.0 82.0  PLT 381 399 405* 362 149   Basic Metabolic Panel:  Recent Labs Lab 09/14/16 0313  09/14/16 1616 09/15/16 0309 09/16/16 1603 09/17/16 0531 09/18/16 0613  NA 133*  --  134* 133* 130* 131* 129*  K 4.2  --  4.3 3.8 3.4* 3.6 4.5  CL 96*  --  95* 95* 92* 96* 95*  CO2 17*  --  16* 20* 23 21* 17*  GLUCOSE 61*  --  95 82 109* 110* 81  BUN 161*  --  176* 108* 84* 67* 92*  CREATININE 9.64*  --  10.38* 7.73* 7.21* 6.67* 8.16*  CALCIUM  8.6*  < > 8.7* 8.7* 9.1 9.2 8.9  PHOS 11.6*  --  11.5*  --  6.4*  --   --   < > = values in this interval not displayed. GFR: Estimated Creatinine Clearance: 9.2 mL/min (by C-G formula based on SCr of 8.16 mg/dL (H)). Liver Function Tests:  Recent Labs Lab 09/14/16 1616 09/16/16 1603  ALBUMIN 2.5* 2.7*   No results for input(s): LIPASE, AMYLASE in the last 168 hours. No results for input(s): AMMONIA in the last 168 hours. Coagulation Profile: No results for input(s): INR, PROTIME in the last 168 hours. Cardiac Enzymes: No results for input(s): CKTOTAL, CKMB, CKMBINDEX, TROPONINI in the last 168 hours. BNP (last 3 results) No results for input(s): PROBNP in the last 8760 hours. HbA1C: No results for input(s): HGBA1C in the last 72 hours. CBG:  Recent Labs Lab 09/14/16 0656 09/14/16 0742 09/14/16 2119 09/15/16 0800  09/16/16 1945  GLUCAP 71 68 83 94 160*   Lipid Profile: No results for input(s): CHOL, HDL, LDLCALC, TRIG, CHOLHDL, LDLDIRECT in the last 72 hours. Thyroid Function Tests: No results for input(s): TSH, T4TOTAL, FREET4, T3FREE, THYROIDAB in the last 72 hours. Anemia Panel: No results for input(s): VITAMINB12, FOLATE, FERRITIN, TIBC, IRON, RETICCTPCT in the last 72 hours. Urine analysis:    Component Value Date/Time   COLORURINE YELLOW 09/13/2016 1100   APPEARANCEUR CLEAR 09/13/2016 1100   LABSPEC 1.011 09/13/2016 1100   PHURINE 5.0 09/13/2016 1100   GLUCOSEU NEGATIVE 09/13/2016 1100   HGBUR MODERATE (A) 09/13/2016 1100   BILIRUBINUR NEGATIVE 09/13/2016 1100   KETONESUR NEGATIVE 09/13/2016 1100   PROTEINUR 100 (A) 09/13/2016 1100   NITRITE NEGATIVE 09/13/2016 1100   LEUKOCYTESUR NEGATIVE 09/13/2016 1100   Sepsis Labs: @LABRCNTIP (procalcitonin:4,lacticidven:4)  ) Recent Results (from the past 240 hour(s))  Culture, blood (routine x 2)     Status: None   Collection Time: 09/09/16  9:55 AM  Result Value Ref Range Status   Specimen Description BLOOD  RIGHT HAND  Final   Special Requests BOTTLES DRAWN AEROBIC AND ANAEROBIC  10CC  Final   Culture NO GROWTH 5 DAYS  Final   Report Status 09/14/2016 FINAL  Final  Culture, blood (routine x 2)     Status: None   Collection Time: 09/09/16  9:58 AM  Result Value Ref Range Status   Specimen Description BLOOD LEFT HAND  Final   Special Requests BOTTLES DRAWN AEROBIC AND ANAEROBIC  5CC  Final   Culture NO GROWTH 5 DAYS  Final   Report Status 09/14/2016 FINAL  Final  MRSA PCR Screening     Status: None   Collection Time: 09/09/16 10:52 AM  Result Value Ref Range Status   MRSA by PCR NEGATIVE NEGATIVE Final    Comment:        The GeneXpert MRSA Assay (FDA approved for NASAL specimens only), is one component of a comprehensive MRSA colonization surveillance program. It is not intended to diagnose MRSA infection nor to guide or monitor treatment for MRSA infections.       Radiology Studies: No results found.   Scheduled Meds: . amLODipine  10 mg Oral QHS  . calcitRIOL  0.5 mcg Oral QODAY  . calcium acetate  1,334 mg Oral TID WC  . carvedilol  25 mg Oral BID WC  . feeding supplement (NEPRO CARB STEADY)  237 mL Oral TID BM  . heparin subcutaneous  5,000 Units Subcutaneous Q8H  . isosorbide-hydrALAZINE  1 tablet Oral TID  . LORazepam  0.5 mg Oral BID  . multivitamin  1 tablet Oral QHS  . tamsulosin  0.4 mg Oral Daily   Continuous Infusions:   LOS: 9 days   Time Spent in minutes   30 minutes  Evgenia Merriman D.O. on 09/18/2016 at 10:40 AM  Between 7am to 7pm - Pager - [phone]  After 7pm go to [email or web address] - password [email or web address]  And look for the night coverage person covering for me after hours  [facility] Office  [phone]

## 2016-09-18 NOTE — Progress Notes (Signed)
Patient has had a fall this admission and considered a high fall risk. Patient refused to have bed alarm on and denies the fall. RN educated patient about his safety and patient verbalized understanding. RN will continue to monitor patient.   Ermalinda Memos, RN

## 2016-09-18 NOTE — Progress Notes (Signed)
Patient ID: Timothy [name], male   DOB: [birth year], 55 y.o.   MRN: [id number] S: wants to get his catheter out of his neck O:BP (!) 141/95 (BP Location: Left Arm)   Pulse 75   Temp 97.9 F (36.6 C) (Oral)   Resp 17   Ht 6\' 1"  (1.854 m)   Wt 63.6 kg (140 lb 3.2 oz)   SpO2 98%   BMI 18.50 kg/m   Intake/Output Summary (Last 24 hours) at 09/18/16 1120 Last data filed at 09/18/16 0930  Gross per 24 hour  Intake             1240 ml  Output              250 ml  Net              990 ml   Intake/Output: I/O last 3 completed shifts: In: 4944 [P.O.:1820] Out: 50 [Urine:50]  Intake/Output this shift:  Total I/O In: 220 [P.O.:220] Out: 200 [Urine:200] Weight change: -0.206 kg (-7.3 oz) Gen:WD AAM in NAD CVS:no rub Resp:scattered rhonchi HQP:RFFMBW Ext:no edema    Recent Labs Lab 09/13/16 0554 09/14/16 0313 09/14/16 1615 09/14/16 1616 09/15/16 0309 09/16/16 1603 09/17/16 0531 09/18/16 0613  NA 132* 133*  --  134* 133* 130* 131* 129*  K 4.1 4.2  --  4.3 3.8 3.4* 3.6 4.5  CL 97* 96*  --  95* 95* 92* 96* 95*  CO2 16* 17*  --  16* 20* 23 21* 17*  GLUCOSE 86 61*  --  95 82 109* 110* 81  BUN 142* 161*  --  176* 108* 84* 67* 92*  CREATININE 8.58* 9.64*  --  10.38* 7.73* 7.21* 6.67* 8.16*  ALBUMIN  --   --   --  2.5*  --  2.7*  --   --   CALCIUM 8.4* 8.6* 8.4* 8.7* 8.7* 9.1 9.2 8.9  PHOS  --  11.6*  --  11.5*  --  6.4*  --   --    Liver Function Tests:  Recent Labs Lab 09/14/16 1616 09/16/16 1603  ALBUMIN 2.5* 2.7*   No results for input(s): LIPASE, AMYLASE in the last 168 hours. No results for input(s): AMMONIA in the last 168 hours. CBC:  Recent Labs Lab 09/14/16 1615 09/15/16 0309 09/16/16 1603 09/17/16 0531 09/18/16 0613  WBC 18.2* 13.6* 9.7 10.4 12.2*  HGB 10.1* 11.2* 11.8* 12.6* 11.6*  HCT 28.6* 31.5* 33.9* 36.1* 33.6*  MCV 80.8 80.6 81.9 82.0 82.0  PLT 381 399 405* 362 320   Cardiac Enzymes: No results for input(s): CKTOTAL, CKMB, CKMBINDEX,  TROPONINI in the last 168 hours. CBG:  Recent Labs Lab 09/14/16 0656 09/14/16 0742 09/14/16 2119 09/15/16 0800 09/16/16 1945  GLUCAP 71 68 83 94 160*    Iron Studies: No results for input(s): IRON, TIBC, TRANSFERRIN, FERRITIN in the last 72 hours. Studies/Results: No results found. Marland Kitchen amLODipine  10 mg Oral QHS  . calcitRIOL  0.5 mcg Oral QODAY  . calcium acetate  1,334 mg Oral TID WC  . carvedilol  25 mg Oral BID WC  . feeding supplement (NEPRO CARB STEADY)  237 mL Oral TID BM  . heparin subcutaneous  5,000 Units Subcutaneous Q8H  . isosorbide-hydrALAZINE  1 tablet Oral TID  . LORazepam  0.5 mg Oral BID  . multivitamin  1 tablet Oral QHS  . tamsulosin  0.4 mg Oral Daily    BMET    Component Value Date/Time   NA 129 (  L) 09/18/2016 0613   K 4.5 09/18/2016 0613   CL 95 (L) 09/18/2016 0613   CO2 17 (L) 09/18/2016 0613   GLUCOSE 81 09/18/2016 0613   BUN 92 (H) 09/18/2016 0613   CREATININE 8.16 (H) 09/18/2016 0613   CREATININE 1.59 (H) 07/29/2015 0943   CALCIUM 8.9 09/18/2016 0613   CALCIUM 8.4 (L) 09/14/2016 1615   GFRNONAA 7 (L) 09/18/2016 0613   GFRNONAA 48 (L) 07/29/2015 0943   GFRAA 8 (L) 09/18/2016 0613   GFRAA 56 (L) 07/29/2015 0943   CBC    Component Value Date/Time   WBC 12.2 (H) 09/18/2016 0613   RBC 4.10 (L) 09/18/2016 0613   HGB 11.6 (L) 09/18/2016 0613   HCT 33.6 (L) 09/18/2016 0613   PLT 320 09/18/2016 0613   MCV 82.0 09/18/2016 0613   MCH 28.3 09/18/2016 0613   MCHC 34.5 09/18/2016 0613   RDW 12.8 09/18/2016 0613   LYMPHSABS 1.9 07/29/2015 0943   MONOABS 0.8 07/29/2015 0943   EOSABS 0.1 07/29/2015 0943   BASOSABS 0.0 07/29/2015 0943     Assessment/Plan:  1. ESRD- continue with HD on TTS schedule.  Will need permanent access placed and outpatient dialysis to be arranged. 2. Substance abuse 3. HTN- stable 4. HPTH- on vit D and binders 5. Anemia of CKD- stable 6. Vascular access- will need permanent access placed prior to discharge due to  his noncompliance with follow up.  Dr. Bridgett Larsson was supposed to f/u today and will wait for scheduling.   [place]

## 2016-09-19 ENCOUNTER — Encounter (HOSPITAL_COMMUNITY)
Admission: EM | Disposition: A | Discharge status: Home Health | Payer: Self-pay | Source: Home / Self Care | Attending: Internal Medicine

## 2016-09-19 ENCOUNTER — Inpatient Hospital Stay (HOSPITAL_COMMUNITY): Payer: Medicaid Other

## 2016-09-19 ENCOUNTER — Inpatient Hospital Stay (HOSPITAL_COMMUNITY): Payer: Medicaid Other | Admitting: Certified Registered Nurse Anesthetist

## 2016-09-19 DIAGNOSIS — N185 Chronic kidney disease, stage 5: Secondary | ICD-10-CM

## 2016-09-19 DIAGNOSIS — T82898A Other specified complication of vascular prosthetic devices, implants and grafts, initial encounter: Secondary | ICD-10-CM

## 2016-09-19 HISTORY — PX: INSERTION OF DIALYSIS CATHETER: SHX1324

## 2016-09-19 HISTORY — PX: AV FISTULA PLACEMENT: SHX1204

## 2016-09-19 LAB — BASIC METABOLIC PANEL
ANION GAP: 18 — AB (ref 5–15)
BUN: 126 mg/dL — ABNORMAL HIGH (ref 6–20)
CALCIUM: 8.8 mg/dL — AB (ref 8.9–10.3)
CO2: 16 mmol/L — ABNORMAL LOW (ref 22–32)
CREATININE: 9.56 mg/dL — AB (ref 0.61–1.24)
Chloride: 95 mmol/L — ABNORMAL LOW (ref 101–111)
GFR, EST AFRICAN AMERICAN: 6 mL/min — AB (ref >60)
GFR, EST NON AFRICAN AMERICAN: 5 mL/min — AB (ref >60)
Glucose, Bld: 79 mg/dL (ref 65–99)
Potassium: 4.3 mmol/L (ref 3.5–5.1)
Sodium: 129 mmol/L — ABNORMAL LOW (ref 135–145)

## 2016-09-19 LAB — CBC
HEMATOCRIT: 31.7 % — AB (ref 39.0–52.0)
Hemoglobin: 11 g/dL — ABNORMAL LOW (ref 13.0–17.0)
MCH: 28.2 pg (ref 26.0–34.0)
MCHC: 34.7 g/dL (ref 30.0–36.0)
MCV: 81.3 fL (ref 78.0–100.0)
Platelets: 395 10*3/uL (ref 150–400)
RBC: 3.9 MIL/uL — ABNORMAL LOW (ref 4.22–5.81)
RDW: 12.6 % (ref 11.5–15.5)
WBC: 11 10*3/uL — AB (ref 4.0–10.5)

## 2016-09-19 LAB — SURGICAL PCR SCREEN
MRSA, PCR: NEGATIVE
STAPHYLOCOCCUS AUREUS: POSITIVE — AB

## 2016-09-19 SURGERY — INSERTION OF DIALYSIS CATHETER
Anesthesia: General | Site: Neck

## 2016-09-19 MED ORDER — HEPARIN SODIUM (PORCINE) 1000 UNIT/ML IJ SOLN
INTRAMUSCULAR | Status: DC | PRN
  Administered 2016-09-19: 1000 [IU]

## 2016-09-19 MED ORDER — MIDAZOLAM HCL 2 MG/2ML IJ SOLN
INTRAMUSCULAR | Status: AC
  Filled 2016-09-19: qty 2

## 2016-09-19 MED ORDER — PROMETHAZINE HCL 25 MG/ML IJ SOLN: 6.2500 mg | INTRAMUSCULAR | Status: DC | PRN

## 2016-09-19 MED ORDER — PHENYLEPHRINE HCL 10 MG/ML IJ SOLN
INTRAVENOUS | Status: DC | PRN
  Administered 2016-09-19: 50 ug/min via INTRAVENOUS

## 2016-09-19 MED ORDER — PROPOFOL 10 MG/ML IV BOLUS
INTRAVENOUS | Status: AC
  Filled 2016-09-19: qty 20

## 2016-09-19 MED ORDER — 0.9 % SODIUM CHLORIDE (POUR BTL) OPTIME
TOPICAL | Status: DC | PRN
  Administered 2016-09-19: 1000 mL

## 2016-09-19 MED ORDER — PROPOFOL 10 MG/ML IV BOLUS
INTRAVENOUS | Status: DC | PRN
  Administered 2016-09-19: 120 mg via INTRAVENOUS

## 2016-09-19 MED ORDER — MENTHOL 3 MG MT LOZG
1.0000 | LOZENGE | OROMUCOSAL | Status: DC | PRN
  Filled 2016-09-19: qty 9

## 2016-09-19 MED ORDER — LIDOCAINE HCL (PF) 1 % IJ SOLN
INTRAMUSCULAR | Status: DC | PRN
  Administered 2016-09-19: 30 mL

## 2016-09-19 MED ORDER — HYDROCODONE-ACETAMINOPHEN 5-325 MG PO TABS
1.0000 | ORAL_TABLET | ORAL | Status: DC | PRN
  Administered 2016-09-20 – 2016-09-21 (×6): 1 via ORAL
  Filled 2016-09-19 (×6): qty 1

## 2016-09-19 MED ORDER — HYDROMORPHONE HCL 1 MG/ML IJ SOLN
0.2500 mg | INTRAMUSCULAR | Status: DC | PRN
  Administered 2016-09-19 (×4): 0.5 mg via INTRAVENOUS

## 2016-09-19 MED ORDER — SODIUM CHLORIDE 0.9 % IV SOLN
INTRAVENOUS | Status: DC | PRN
  Administered 2016-09-19: 500 mL

## 2016-09-19 MED ORDER — CHLORHEXIDINE GLUCONATE CLOTH 2 % EX PADS: 6.0000 | MEDICATED_PAD | Freq: Every day | CUTANEOUS | Status: DC

## 2016-09-19 MED ORDER — GLYCOPYRROLATE 0.2 MG/ML IJ SOLN
INTRAMUSCULAR | Status: DC | PRN
  Administered 2016-09-19: 0.3 mg via INTRAVENOUS
  Administered 2016-09-19: 0.2 mg via INTRAVENOUS

## 2016-09-19 MED ORDER — HEPARIN SODIUM (PORCINE) 1000 UNIT/ML IJ SOLN
INTRAMUSCULAR | Status: AC
  Filled 2016-09-19: qty 1

## 2016-09-19 MED ORDER — LIDOCAINE HCL (CARDIAC) 20 MG/ML IV SOLN
INTRAVENOUS | Status: DC | PRN
Start: 2016-09-19 — End: 2016-09-19
  Administered 2016-09-19: 100 mg via INTRAVENOUS

## 2016-09-19 MED ORDER — SODIUM CHLORIDE 0.9 % IV SOLN
INTRAVENOUS | Status: DC
  Administered 2016-09-19 (×2): via INTRAVENOUS

## 2016-09-19 MED ORDER — EPHEDRINE SULFATE-NACL 50-0.9 MG/10ML-% IV SOSY
PREFILLED_SYRINGE | INTRAVENOUS | Status: DC | PRN
  Administered 2016-09-19 (×3): 10 mg via INTRAVENOUS

## 2016-09-19 MED ORDER — HYDROMORPHONE HCL 2 MG/ML IJ SOLN
INTRAMUSCULAR | Status: AC
  Administered 2016-09-19: 2 mg
  Filled 2016-09-19: qty 1

## 2016-09-19 MED ORDER — ROCURONIUM BROMIDE 100 MG/10ML IV SOLN
INTRAVENOUS | Status: DC | PRN
  Administered 2016-09-19: 50 mg via INTRAVENOUS

## 2016-09-19 MED ORDER — FENTANYL CITRATE (PF) 100 MCG/2ML IJ SOLN
INTRAMUSCULAR | Status: DC | PRN
  Administered 2016-09-19: 50 ug via INTRAVENOUS
  Administered 2016-09-19 (×2): 25 ug via INTRAVENOUS

## 2016-09-19 MED ORDER — MIDAZOLAM HCL 5 MG/5ML IJ SOLN
INTRAMUSCULAR | Status: DC | PRN
  Administered 2016-09-19: 2 mg via INTRAVENOUS

## 2016-09-19 MED ORDER — MUPIROCIN 2 % EX OINT
1.0000 "application " | TOPICAL_OINTMENT | Freq: Two times a day (BID) | CUTANEOUS | Status: DC
  Administered 2016-09-19 – 2016-09-20 (×4): 1 via NASAL
  Filled 2016-09-19: qty 22

## 2016-09-19 MED ORDER — FENTANYL CITRATE (PF) 100 MCG/2ML IJ SOLN
INTRAMUSCULAR | Status: AC
  Filled 2016-09-19: qty 2

## 2016-09-19 MED ORDER — PHENYLEPHRINE 40 MCG/ML (10ML) SYRINGE FOR IV PUSH (FOR BLOOD PRESSURE SUPPORT)
PREFILLED_SYRINGE | INTRAVENOUS | Status: DC | PRN
  Administered 2016-09-19: 40 ug via INTRAVENOUS
  Administered 2016-09-19: 80 ug via INTRAVENOUS
  Administered 2016-09-19: 40 ug via INTRAVENOUS

## 2016-09-19 MED ORDER — LIDOCAINE HCL (PF) 1 % IJ SOLN
INTRAMUSCULAR | Status: AC
  Filled 2016-09-19: qty 30

## 2016-09-19 MED ORDER — NEOSTIGMINE METHYLSULFATE 10 MG/10ML IV SOLN
INTRAVENOUS | Status: DC | PRN
  Administered 2016-09-19: 3 mg via INTRAVENOUS

## 2016-09-19 SURGICAL SUPPLY — 59 items
ARMBAND PINK RESTRICT EXTREMIT (MISCELLANEOUS) ×6 IMPLANT
BAG DECANTER FOR FLEXI CONT (MISCELLANEOUS) ×3 IMPLANT
BIOPATCH RED 1 DISK 7.0 (GAUZE/BANDAGES/DRESSINGS) ×3 IMPLANT
CANISTER SUCTION 2500CC (MISCELLANEOUS) ×3 IMPLANT
CATH PALINDROME RT-P 15FX19CM (CATHETERS) IMPLANT
CATH PALINDROME RT-P 15FX23CM (CATHETERS) ×3 IMPLANT
CATH PALINDROME RT-P 15FX28CM (CATHETERS) IMPLANT
CATH PALINDROME RT-P 15FX55CM (CATHETERS) IMPLANT
CATH STRAIGHT 5FR 65CM (CATHETERS) IMPLANT
CLIP TI MEDIUM 6 (CLIP) ×3 IMPLANT
CLIP TI WIDE RED SMALL 6 (CLIP) ×3 IMPLANT
COVER PROBE W GEL 5X96 (DRAPES) ×3 IMPLANT
COVER SURGICAL LIGHT HANDLE (MISCELLANEOUS) ×3 IMPLANT
DECANTER SPIKE VIAL GLASS SM (MISCELLANEOUS) ×3 IMPLANT
DERMABOND ADVANCED (GAUZE/BANDAGES/DRESSINGS) ×2
DERMABOND ADVANCED .7 DNX12 (GAUZE/BANDAGES/DRESSINGS) ×4 IMPLANT
DRAPE C-ARM 42X72 X-RAY (DRAPES) ×3 IMPLANT
DRAPE CHEST BREAST 15X10 FENES (DRAPES) ×3 IMPLANT
DRSG COVADERM 4X6 (GAUZE/BANDAGES/DRESSINGS) ×3 IMPLANT
ELECT REM PT RETURN 9FT ADLT (ELECTROSURGICAL) ×3
ELECTRODE REM PT RTRN 9FT ADLT (ELECTROSURGICAL) ×2 IMPLANT
GAUZE SPONGE 2X2 8PLY STRL LF (GAUZE/BANDAGES/DRESSINGS) IMPLANT
GAUZE SPONGE 4X4 16PLY XRAY LF (GAUZE/BANDAGES/DRESSINGS) ×3 IMPLANT
GLOVE BIO SURGEON STRL SZ7 (GLOVE) ×3 IMPLANT
GLOVE BIOGEL PI IND STRL 6.5 (GLOVE) ×2 IMPLANT
GLOVE BIOGEL PI IND STRL 7.5 (GLOVE) ×2 IMPLANT
GLOVE BIOGEL PI INDICATOR 6.5 (GLOVE) ×1
GLOVE BIOGEL PI INDICATOR 7.5 (GLOVE) ×1
GLOVE ECLIPSE 6.5 STRL STRAW (GLOVE) ×3 IMPLANT
GOWN STRL REUS W/ TWL LRG LVL3 (GOWN DISPOSABLE) ×8 IMPLANT
GOWN STRL REUS W/TWL LRG LVL3 (GOWN DISPOSABLE) ×4
HEMOSTAT SPONGE AVITENE ULTRA (HEMOSTASIS) IMPLANT
KIT BASIN OR (CUSTOM PROCEDURE TRAY) ×3 IMPLANT
KIT ROOM TURNOVER OR (KITS) ×3 IMPLANT
NEEDLE 18GX1X1/2 (RX/OR ONLY) (NEEDLE) ×3 IMPLANT
NEEDLE HYPO 25GX1X1/2 BEV (NEEDLE) ×3 IMPLANT
NS IRRIG 1000ML POUR BTL (IV SOLUTION) ×3 IMPLANT
PACK CV ACCESS (CUSTOM PROCEDURE TRAY) ×3 IMPLANT
PACK SURGICAL SETUP 50X90 (CUSTOM PROCEDURE TRAY) ×3 IMPLANT
PAD ARMBOARD 7.5X6 YLW CONV (MISCELLANEOUS) ×6 IMPLANT
SET MICROPUNCTURE 5F STIFF (MISCELLANEOUS) IMPLANT
SOAP 2 % CHG 4 OZ (WOUND CARE) ×3 IMPLANT
SPONGE GAUZE 2X2 STER 10/PKG (GAUZE/BANDAGES/DRESSINGS)
SUT ETHILON 3 0 PS 1 (SUTURE) ×3 IMPLANT
SUT MNCRL AB 4-0 PS2 18 (SUTURE) ×3 IMPLANT
SUT PROLENE 6 0 BV (SUTURE) IMPLANT
SUT PROLENE 7 0 BV 1 (SUTURE) ×3 IMPLANT
SUT VIC AB 3-0 SH 27 (SUTURE) ×1
SUT VIC AB 3-0 SH 27X BRD (SUTURE) ×2 IMPLANT
SYR 20CC LL (SYRINGE) ×6 IMPLANT
SYR 3ML LL SCALE MARK (SYRINGE) ×3 IMPLANT
SYR 5ML LL (SYRINGE) ×3 IMPLANT
SYR CONTROL 10ML LL (SYRINGE) ×3 IMPLANT
SYRINGE 10CC LL (SYRINGE) ×3 IMPLANT
TOWEL OR 17X24 6PK STRL BLUE (TOWEL DISPOSABLE) ×3 IMPLANT
TOWEL OR 17X26 4PK STRL BLUE (TOWEL DISPOSABLE) ×3 IMPLANT
UNDERPAD 30X30 (UNDERPADS AND DIAPERS) ×3 IMPLANT
WATER STERILE IRR 1000ML POUR (IV SOLUTION) ×3 IMPLANT
WIRE AMPLATZ SS-J .035X180CM (WIRE) IMPLANT

## 2016-09-19 NOTE — Clinical Social Work Note (Signed)
Substance abuse consult received and request from attending to talk with patient. CSW unable to talk with patient today due to a procedure and then dialysis. CSW will follow-up with patient on 12/6.  Koji Niehoff Givens, MSW, LCSW Licensed Clinical Social Worker [place] [phone]

## 2016-09-19 NOTE — Anesthesia Procedure Notes (Signed)
Procedure Name: Intubation Date/Time: 09/19/2016 10:48 AM Performed by: Shirlyn Goltz Pre-anesthesia Checklist: Patient identified, Emergency Drugs available, Suction available and Patient being monitored Patient Re-evaluated:Patient Re-evaluated prior to inductionOxygen Delivery Method: Circle system utilized Preoxygenation: Pre-oxygenation with 100% oxygen Intubation Type: IV induction Ventilation: Mask ventilation with difficulty Laryngoscope Size: Mac and 4 Tube type: Oral Tube size: 7.0 mm Number of attempts: 1 Airway Equipment and Method: Stylet Placement Confirmation: ETT inserted through vocal cords under direct vision,  positive ETCO2 and breath sounds checked- equal and bilateral Secured at: 22 cm Tube secured with: Tape Dental Injury: Teeth and Oropharynx as per pre-operative assessment

## 2016-09-19 NOTE — Progress Notes (Signed)
Received report at bedside from [place]. Pt is alert and orientated.   Assessed left arm fistula- no drainage or complications noted.   Right Forearm Peripheral IV was taken out in the OR and right hand peripheral IV placed.   Pt reports sore throat- paged physician and awaiting orders.  Will continue to monitor.  Paulla Fore, RN

## 2016-09-19 NOTE — Progress Notes (Signed)
Patient ID: Naveed Humphres, male   DOB: 04-13-1961, 55 y.o.   MRN: [id number] S:c/o soreness at his left arm O:BP (!) 168/102 (BP Location: Left Arm)   Pulse 70   Temp 97.7 F (36.5 C) (Oral)   Resp 16   Ht 6\' 1"  (1.854 m)   Wt 62.2 kg (137 lb 2 oz)   SpO2 98%   BMI 18.09 kg/m   Intake/Output Summary (Last 24 hours) at 09/19/16 0907 Last data filed at 09/19/16 0836  Gross per 24 hour  Intake              937 ml  Output                0 ml  Net              937 ml   Intake/Output: I/O last 3 completed shifts: In: 4166 [P.O.:1417] Out: 200 [Urine:200]  Intake/Output this shift:  No intake/output data recorded. Weight change: -1.394 kg (-3 lb 1.2 oz) Gen:WD WN AAM in NAD, somnolent but arousable CVS:no rub Resp:cta AYT:KZSWFU Ext:no edema LAVF +T/B   Recent Labs Lab 09/14/16 0313 09/14/16 1615 09/14/16 1616 09/15/16 0309 09/16/16 1603 09/17/16 0531 09/18/16 0613 09/19/16 0659  NA 133*  --  134* 133* 130* 131* 129* 129*  K 4.2  --  4.3 3.8 3.4* 3.6 4.5 4.3  CL 96*  --  95* 95* 92* 96* 95* 95*  CO2 17*  --  16* 20* 23 21* 17* 16*  GLUCOSE 61*  --  95 82 109* 110* 81 79  BUN 161*  --  176* 108* 84* 67* 92* 126*  CREATININE 9.64*  --  10.38* 7.73* 7.21* 6.67* 8.16* 9.56*  ALBUMIN  --   --  2.5*  --  2.7*  --   --   --   CALCIUM 8.6* 8.4* 8.7* 8.7* 9.1 9.2 8.9 8.8*  PHOS 11.6*  --  11.5*  --  6.4*  --   --   --    Liver Function Tests:  Recent Labs Lab 09/14/16 1616 09/16/16 1603  ALBUMIN 2.5* 2.7*   No results for input(s): LIPASE, AMYLASE in the last 168 hours. No results for input(s): AMMONIA in the last 168 hours. CBC:  Recent Labs Lab 09/15/16 0309 09/16/16 1603 09/17/16 0531 09/18/16 0613 09/19/16 0659  WBC 13.6* 9.7 10.4 12.2* 11.0*  HGB 11.2* 11.8* 12.6* 11.6* 11.0*  HCT 31.5* 33.9* 36.1* 33.6* 31.7*  MCV 80.6 81.9 82.0 82.0 81.3  PLT 399 405* 362 320 395   Cardiac Enzymes: No results for input(s): CKTOTAL, CKMB, CKMBINDEX, TROPONINI in  the last 168 hours. CBG:  Recent Labs Lab 09/14/16 0656 09/14/16 0742 09/14/16 2119 09/15/16 0800 09/16/16 1945  GLUCAP 71 68 83 94 160*    Iron Studies: No results for input(s): IRON, TIBC, TRANSFERRIN, FERRITIN in the last 72 hours. Studies/Results: No results found. Marland Kitchen amLODipine  10 mg Oral QHS  . calcitRIOL  0.5 mcg Oral QODAY  . calcium acetate  1,334 mg Oral TID WC  . carvedilol  25 mg Oral BID WC  . cefUROXime (ZINACEF)  IV  1.5 g Intravenous On Call to OR  . Chlorhexidine Gluconate Cloth  6 each Topical Daily  . feeding supplement (NEPRO CARB STEADY)  237 mL Oral TID BM  . heparin subcutaneous  5,000 Units Subcutaneous Q8H  . isosorbide-hydrALAZINE  1 tablet Oral TID  . LORazepam  0.5 mg Oral BID  . multivitamin  1  tablet Oral QHS  . mupirocin ointment  1 application Nasal BID  . tamsulosin  0.4 mg Oral Daily    BMET    Component Value Date/Time   NA 129 (L) 09/19/2016 0659   K 4.3 09/19/2016 0659   CL 95 (L) 09/19/2016 0659   CO2 16 (L) 09/19/2016 0659   GLUCOSE 79 09/19/2016 0659   BUN 126 (H) 09/19/2016 0659   CREATININE 9.56 (H) 09/19/2016 0659   CREATININE 1.59 (H) 07/29/2015 0943   CALCIUM 8.8 (L) 09/19/2016 0659   CALCIUM 8.4 (L) 09/14/2016 1615   GFRNONAA 5 (L) 09/19/2016 0659   GFRNONAA 48 (L) 07/29/2015 0943   GFRAA 6 (L) 09/19/2016 0659   GFRAA 56 (L) 07/29/2015 0943   CBC    Component Value Date/Time   WBC 11.0 (H) 09/19/2016 0659   RBC 3.90 (L) 09/19/2016 0659   HGB 11.0 (L) 09/19/2016 0659   HCT 31.7 (L) 09/19/2016 0659   PLT 395 09/19/2016 0659   MCV 81.3 09/19/2016 0659   MCH 28.2 09/19/2016 0659   MCHC 34.7 09/19/2016 0659   RDW 12.6 09/19/2016 0659   LYMPHSABS 1.9 07/29/2015 0943   MONOABS 0.8 07/29/2015 0943   EOSABS 0.1 07/29/2015 0943   BASOSABS 0.0 07/29/2015 0943     Assessment/Plan:  1. ESRD- continue with HD on TTS schedule.  Will need permanent access placed and outpatient dialysis to be arranged. 2. Substance  abuse 3. HTN- stable 4. HPTH- on vit D and binders 5. Anemia of CKD- stable 6. Vascular access- s/p RIJ TDC and LAVF placement today by Dr. Bridgett Larsson.  No evidence of steal and good thrill/bruit.  Appreciate Dr. Lianne Moris assistance.  Donetta Potts, MD [employer] [phone]

## 2016-09-19 NOTE — Interval H&P Note (Signed)
History and Physical Interval Note:  09/19/2016 10:03 AM  Timothy [name]  has presented today for surgery, with the diagnosis of End Stage Renal Disease N18.6  The various methods of treatment have been discussed with the patient and family. After consideration of risks, benefits and other options for treatment, the patient has consented to  Procedure(s): INSERTION OF TUNNELED DIALYSIS CATHETER (N/A) ARTERIOVENOUS (AV) FISTULA CREATION VS INSERTION OF ARTERIOVENOUS GORTEX GRAFT (Left) as a surgical intervention .  The patient's history has been reviewed, patient examined, no change in status, stable for surgery.  I have reviewed the patient's chart and labs.  Questions were answered to the patient's satisfaction.     Adele Barthel

## 2016-09-19 NOTE — Transfer of Care (Signed)
Immediate Anesthesia Transfer of Care Note  Patient: Timothy [name]  Procedure(s) Performed: Procedure(s): INSERTION OF TUNNELED DIALYSIS CATHETER (N/A) Left arm Radiocephalic ARTERIOVENOUS (AV) FISTULA CREATION (Left)  Patient Location: PACU  Anesthesia Type:General  Level of Consciousness: awake, alert , oriented and patient cooperative  Airway & Oxygen Therapy: Patient Spontanous Breathing and Patient connected to nasal cannula oxygen  Post-op Assessment: Report given to RN and Post -op Vital signs reviewed and stable  Post vital signs: Reviewed and stable  Last Vitals:  Vitals:   09/19/16 0646 09/19/16 0826  BP: (!) 168/102   Pulse: 70 70  Resp: 16   Temp: 36.5 C     Last Pain:  Vitals:   09/19/16 0646  TempSrc: Oral  PainSc:       Patients Stated Pain Goal: 1 (40/10/27 2536)  Complications: No apparent anesthesia complications

## 2016-09-19 NOTE — Anesthesia Postprocedure Evaluation (Signed)
Anesthesia Post Note  Patient: Timothy [name]  Procedure(s) Performed: Procedure(s) (LRB): INSERTION OF TUNNELED DIALYSIS CATHETER (N/A) Left arm Radiocephalic ARTERIOVENOUS (AV) FISTULA CREATION (Left)  Patient location during evaluation: PACU Anesthesia Type: General Level of consciousness: sedated Pain management: pain level controlled Vital Signs Assessment: post-procedure vital signs reviewed and stable Respiratory status: spontaneous breathing and respiratory function stable Cardiovascular status: stable Anesthetic complications: no    Last Vitals:  Vitals:   09/19/16 1353 09/19/16 1400  BP:    Pulse: (!) 50   Resp: 14   Temp:  36.2 C    Last Pain:  Vitals:   09/19/16 0646  TempSrc: Oral  PainSc:                  Jesse Nosbisch DANIEL

## 2016-09-19 NOTE — H&P (View-Only) (Signed)
CONSULT NOTE   MRN : [id number]  Reason for Consult: ESRD Referring Physician: Dr. Jimmy Footman  History of Present Illness: 55 y/o male with CKD presented 11/25 with SOB and chest pain after taking unknown "pain pill" that he bought off the street.  In ER had HTN with SBP 200's despite nitro gtt.  Pt was admitted with Acute on chronic renal disease and hypertensive emergency.  Pt is right handed.  He denies any prior neck catheters and has never had PPM.  Past medical history includes: HF, colon cancer, uncontrolled HTN, CKD, polysubstance abuse    Current Facility-Administered Medications  Medication Dose Route Frequency Provider Last Rate Last Dose  . 0.9 %  sodium chloride infusion  250 mL Intravenous PRN Rush Landmark, MD   Stopped at 09/11/16 2200  . amLODipine (NORVASC) tablet 10 mg  10 mg Oral Daily Donita Brooks, NP   10 mg at 09/14/16 1001  . calcium acetate (PHOSLO) capsule 1,334 mg  1,334 mg Oral TID WC Estanislado Emms, MD   1,334 mg at 09/13/16 1153  . furosemide (LASIX) 160 mg in dextrose 5 % 50 mL IVPB  160 mg Intravenous Q12H Donita Brooks, NP   160 mg at 09/13/16 2259  . heparin injection 5,000 Units  5,000 Units Subcutaneous Q8H Marijean Heath, NP   Stopped at 09/14/16 0537  . hydrALAZINE (APRESOLINE) injection 10-40 mg  10-40 mg Intravenous Q4H PRN Rigoberto Noel, MD   10 mg at 09/14/16 0720  . Influenza vac split quadrivalent PF (FLUARIX) injection 0.5 mL  0.5 mL Intramuscular Tomorrow-1000 Collene Gobble, MD      . ipratropium-albuterol (DUONEB) 0.5-2.5 (3) MG/3ML nebulizer solution 3 mL  3 mL Nebulization Q3H PRN Raylene Miyamoto, MD      . isosorbide-hydrALAZINE (BIDIL) 20-37.5 MG per tablet 1 tablet  1 tablet Oral TID Donita Brooks, NP   1 tablet at 09/14/16 1001  . labetalol (NORMODYNE) tablet 100 mg  100 mg Oral TID Donita Brooks, NP   100 mg at 09/14/16 1001  . LORazepam (ATIVAN) tablet 0.5 mg  0.5 mg Oral BID Raylene Miyamoto, MD   0.5 mg at  09/14/16 1001  . multivitamin with minerals tablet 1 tablet  1 tablet Oral Daily Donita Brooks, NP   1 tablet at 09/13/16 0932  . pneumococcal 23 valent vaccine (PNU-IMMUNE) injection 0.5 mL  0.5 mL Intramuscular Tomorrow-1000 Collene Gobble, MD      . tamsulosin [facility]) capsule 0.4 mg  0.4 mg Oral Daily Donita Brooks, NP   0.4 mg at 09/14/16 1001  . traZODone (DESYREL) tablet 50 mg  50 mg Oral QHS PRN Donita Brooks, NP      . tuberculin injection 5 Units  5 Units Intradermal Once Madelon Lips, MD   5 Units at 09/13/16 2140  . zolpidem (AMBIEN) tablet 5 mg  5 mg Oral QHS PRN Raylene Miyamoto, MD   5 mg at 09/13/16 2258    Pt meds include: Statin :Yes Betablocker: Yes ASA: No Other anticoagulants/antiplatelets:   Past Medical History:  Diagnosis Date  . Acute diastolic heart failure ([place]) 07/17/2016  . BPH (benign prostatic hyperplasia)   . Colon cancer ([place]) 2014  . Elevated troponin   . Hypertension     Past Surgical History:  Procedure Laterality Date  . ABDOMINAL SURGERY    . COLON SURGERY  2014    Social History Social  History  Substance Use Topics  . Smoking status: Current Every Day Smoker    Packs/day: 0.25    Types: Cigarettes  . Smokeless tobacco: Never Used  . Alcohol use 4.2 oz/week    7 Cans of beer per week     Comment: drinks daily 1-2 beers    Family History Family History  Problem Relation Age of Onset  . Heart disease Mother     Died at age 19.  Marland Kitchen Heart failure Mother   . Kidney failure Sister     No Known Allergies   REVIEW OF SYSTEMS  General: [ ]  Weight loss, [ ]  Fever, [ ]  chills Neurologic: [ ]  Dizziness, [ ]  Blackouts, [ ]  Seizure [ ]  Stroke, [ ]  "Mini stroke", [ ]  Slurred speech, [ ]  Temporary blindness; [ ]  weakness in arms or legs, [ ]  Hoarseness [ ]  Dysphagia Cardiac: [ ]  Chest pain/pressure, [x ] Shortness of breath at rest [x ] Shortness of breath with exertion, [ ]  Atrial fibrillation or irregular heartbeat  Vascular: [  ] Pain in legs with walking, [ ]  Pain in legs at rest, [ ]  Pain in legs at night,  [ ]  Non-healing ulcer, [ ]  Blood clot in vein/DVT,   Pulmonary: [ ]  Home oxygen, [x ] Productive cough, [ ]  Coughing up blood, [ ]  Asthma,  [ ]  Wheezing [ ]  COPD Musculoskeletal:  [ ]  Arthritis, [ ]  Low back pain, [ ]  Joint pain Hematologic: [ ]  Easy Bruising, [ ]  Anemia; [ ]  Hepatitis Gastrointestinal: [ ]  Blood in stool, [ ]  Gastroesophageal Reflux/heartburn, Urinary: [x ] chronic Kidney disease, [ ]  on HD - [ ]  MWF or [ ]  TTHS, [ ]  Burning with urination, [ ]  Difficulty urinating Skin: [ ]  Rashes, [ ]  Wounds Psychological: [ ]  Anxiety, [ ]  Depression  Physical Examination Vitals:   09/14/16 0549 09/14/16 0716 09/14/16 0720 09/14/16 0806  BP: (!) 164/111 (!) 168/108 (!) 168/108 (!) 165/108  Pulse: 86   82  Resp: 18 19  (!) 21  Temp: 98.5 F (36.9 C)   98.7 F (37.1 C)  TempSrc: Oral   Oral  SpO2:    99%  Weight:      Height:       Body mass index is 20.98 kg/m.  General:  WDWN in NAD HENT: WNL Eyes: Pupils equal Pulmonary: non-labored breathing  Cardiac: RRR, without  Murmurs, rubs or gallops; No carotid bruits  Abdomen: soft, NT, no masses Skin: no rashes, ulcers noted;  no Gangrene , no cellulitis; no open wounds;  Vascular Exam/Pulses:palpable radial pulses bilaterally Musculoskeletal: no muscle wasting or atrophy; no edema  Neurologic: A&O X 3; Appropriate Affect ; SENSATION: normal; MOTOR FUNCTION: 5/5 Symmetric; Speech is fluent/normal; Gait: Normal Psychiatric: Judgment intact, Mood & affect appropriate for pt's clinical situation Lymph : No Cervical or Axillary lymphadenopathy   Significant Diagnostic Studies: CBC Lab Results  Component Value Date   WBC 17.0 (H) 09/14/2016   HGB 9.5 (L) 09/14/2016   HCT 26.8 (L) 09/14/2016   MCV 81.2 09/14/2016   PLT 326 09/14/2016    BMET    Component Value Date/Time   NA 133 (L) 09/14/2016 0313   K 4.2 09/14/2016 0313   CL 96 (L)  09/14/2016 0313   CO2 17 (L) 09/14/2016 0313   GLUCOSE 61 (L) 09/14/2016 0313   BUN 161 (H) 09/14/2016 0313   CREATININE 9.64 (H) 09/14/2016 0313   CREATININE 1.59 (H) 07/29/2015 5643  CALCIUM 8.6 (L) 09/14/2016 0313   GFRNONAA 5 (L) 09/14/2016 0313   GFRNONAA 48 (L) 07/29/2015 0943   GFRAA 6 (L) 09/14/2016 0313   GFRAA Timothy (L) 07/29/2015 0943   Estimated Creatinine Clearance: 8.8 mL/min (by C-G formula based on SCr of 9.64 mg/dL (H)).  COAG No results found for: INR, PROTIME  Radiology: Ir Fluoro Guide Cv Line Right  Result Date: 09/14/2016 INDICATION: 55 year old male with a history of acute kidney insufficiency superimposed on chronic kidney disease. EXAM: TUNNELED CENTRAL VENOUS HEMODIALYSIS CATHETER PLACEMENT WITH ULTRASOUND AND FLUOROSCOPIC GUIDANCE MEDICATIONS: None ANESTHESIA/SEDATION: None FLUOROSCOPY TIME:  Fluoroscopy Time: 0 minutes 6 seconds (1 mGy). COMPLICATIONS: None PROCEDURE: Informed written consent was obtained from the patient and the patient's family after a thorough discussion of the procedural risks, benefits and alternatives. All questions were addressed. A timeout was performed prior to the initiation of the procedure. The right neck and chest was prepped with chlorhexidine, and draped in the usual sterile fashion using maximum barrier technique (cap and mask, sterile gown, sterile gloves, large sterile sheet, hand hygiene and cutaneous antiseptic). Local anesthesia was attained by infiltration with 1% lidocaine without epinephrine. Ultrasound demonstrated patency of the right internal jugular vein, and this was documented with an image. Under real-time ultrasound guidance, this vein was accessed with a 21 gauge micropuncture needle and image documentation was performed. A small dermatotomy was made at the access site with an 11 scalpel. A 0.018" wire was advanced into the SVC and the access needle exchanged for a 43F micropuncture vascular sheath. The 0.018" wire was  then removed and a 0.035" wire advanced into the IVC. Upon withdrawal of the 018 wire, the wire was marked for appropriate length of the internal portion of the catheter. A 19 cm catheter was selected. Skin and subcutaneous tissues were serially dilated. Catheter was placed on the wire. The catheter tip is positioned in the upper right atrium. This was documented with a spot image. Both ports of the hemodialysis catheter were then tested for excellent function. The ports were then locked with heparinized lock. Patient tolerated the procedure well and remained hemodynamically stable throughout. No complications were encountered and no significant blood loss was encountered. IMPRESSION: Status post right IJ approach temporary HD catheter placement. Catheter ready for use. Signed, Dulcy Fanny. Earleen Newport, DO [facility] Radiology Electronically Signed   By: [name] D.O.   On: 09/14/2016 12:49   Ir US Guide Vasc Access Right  Result Date: 09/14/2016 INDICATION: 55 year old male with a history of acute kidney insufficiency superimposed on chronic kidney disease. EXAM: TUNNELED CENTRAL VENOUS HEMODIALYSIS CATHETER PLACEMENT WITH ULTRASOUND AND FLUOROSCOPIC GUIDANCE MEDICATIONS: None ANESTHESIA/SEDATION: None FLUOROSCOPY TIME:  Fluoroscopy Time: 0 minutes 6 seconds (1 mGy). COMPLICATIONS: None PROCEDURE: Informed written consent was obtained from the patient and the patient's family after a thorough discussion of the procedural risks, benefits and alternatives. All questions were addressed. A timeout was performed prior to the initiation of the procedure. The right neck and chest was prepped with chlorhexidine, and draped in the usual sterile fashion using maximum barrier technique (cap and mask, sterile gown, sterile gloves, large sterile sheet, hand hygiene and cutaneous antiseptic). Local anesthesia was attained by infiltration with 1% lidocaine without epinephrine.  Ultrasound demonstrated patency of the right internal jugular vein, and this was documented with an image. Under real-time ultrasound guidance, this vein was accessed with a 21 gauge micropuncture needle and image documentation was performed. A small dermatotomy was made  at the access site with an 11 scalpel. A 0.018" wire was advanced into the SVC and the access needle exchanged for a 35F micropuncture vascular sheath. The 0.018" wire was then removed and a 0.035" wire advanced into the IVC. Upon withdrawal of the 018 wire, the wire was marked for appropriate length of the internal portion of the catheter. A 19 cm catheter was selected. Skin and subcutaneous tissues were serially dilated. Catheter was placed on the wire. The catheter tip is positioned in the upper right atrium. This was documented with a spot image. Both ports of the hemodialysis catheter were then tested for excellent function. The ports were then locked with heparinized lock. Patient tolerated the procedure well and remained hemodynamically stable throughout. No complications were encountered and no significant blood loss was encountered. IMPRESSION: Status post right IJ approach temporary HD catheter placement. Catheter ready for use. Signed, Dulcy Fanny. Earleen Newport, DO [facility] Radiology Electronically Signed   By: [name] D.O.   On: 09/14/2016 12:49     Non-Invasive Vascular Imaging: Vein mapping ordered  ASSESSMENT/PLAN:  AKD on CKD vein mapping pending He is right handed Current plan for IR to place temp cath today   Laurence Slate [facility] 09/14/2016 10:39 AM  Addendum  I have independently interviewed and examined the patient, and I agree with the physician assistant's findings.  Pt will need conversion of RIJV temporary to [facility] and permanent access placement.  - Pt's interaction was limited, so I'm not certain if he has AMS from his uremia or his baseline is low  function. - I don't think he has any understanding of HD or permanent access at this time. - Vein mapping pending. - Would wait until next week before considering TDC placement and L arm fistula vs AVG placement as I don't the patient can consent currently.  Adele Barthel, MD, FACS [facility] [place] Office: [phone] Pager: [phone]  09/14/2016, 3:42 PM

## 2016-09-19 NOTE — Anesthesia Preprocedure Evaluation (Addendum)
Anesthesia Evaluation  Patient identified by MRN, date of birth, ID band Patient awake    Reviewed: Allergy & Precautions, NPO status , Patient's Chart, lab work & pertinent test results  History of Anesthesia Complications (+) history of anesthetic complications  Airway Mallampati: II  TM Distance: >3 FB Neck ROM: Full    Dental  (+) Poor Dentition, Dental Advisory Given   Pulmonary Current Smoker,    Pulmonary exam normal        Cardiovascular hypertension,  Rhythm:Regular Rate:Normal + Systolic murmurs Study Conclusions  - Left ventricle: The cavity size was normal. Wall thickness was   increased in a pattern of moderate LVH. Systolic function was   normal. The estimated ejection fraction was in the range of 60%   to 65%. Doppler parameters are consistent with both elevated   ventricular end-diastolic filling pressure and elevated left   atrial filling pressure. - Mitral valve: There was mild regurgitation. - Left atrium: The atrium was moderately dilated. - Atrial septum: A patent foramen ovale cannot be excluded.    Neuro/Psych negative neurological ROS  negative psych ROS   GI/Hepatic negative GI ROS, (+)     substance abuse  ,   Endo/Other  negative endocrine ROS  Renal/GU CRFRenal disease     Musculoskeletal   Abdominal   Peds  Hematology   Anesthesia Other Findings   Reproductive/Obstetrics                           Anesthesia Physical Anesthesia Plan  ASA: III  Anesthesia Plan: General   Post-op Pain Management:    Induction: Intravenous  Airway Management Planned: LMA  Additional Equipment:   Intra-op Plan:   Post-operative Plan: Extubation in OR  Informed Consent: I have reviewed the patients History and Physical, chart, labs and discussed the procedure including the risks, benefits and alternatives for the proposed anesthesia with the patient or  authorized representative who has indicated his/her understanding and acceptance.   Dental advisory given  Plan Discussed with: Anesthesiologist and CRNA  Anesthesia Plan Comments:         Anesthesia Quick Evaluation

## 2016-09-19 NOTE — Op Note (Signed)
OPERATIVE NOTE  PROCEDURE: 1.  right internal jugular vein tunneled dialysis catheter exchange 2.  Left radiocephalic arteriovenous fistula  3.  Side branch ligation of cephalic vein   PRE-OPERATIVE DIAGNOSIS: end-stage renal failure  POST-OPERATIVE DIAGNOSIS: same as above  SURGEON: Adele Barthel, MD  ANESTHESIA: general  ESTIMATED BLOOD LOSS: 30 cc  FINDING(S): 1.  Tips of the catheter in the right atrium on fluoroscopy 2.  No obvious pneumothorax on fluoroscopy 3.  Faintly palpable thrill in forearm cephalic vein 4.  Palpable radial pulse at end of case  SPECIMEN(S):  none  INDICATIONS:   Timothy [name] is a 55 y.o. male who presents with acute on chronic renal failure.  The patient presents for tunneled dialysis catheter exchange and left arm fistula vs graft placement.  The patient is aware the risks of tunneled dialysis catheter placement include but are not limited to: bleeding, infection, central venous injury, pneumothorax, possible venous stenosis, possible malpositioning in the venous system, and possible infections related to long-term catheter presence.  Risk, benefits, and alternatives to access surgery were discussed.  The patient is aware the risks include but are not limited to: bleeding, infection, steal syndrome, nerve damage, ischemic monomelic neuropathy, failure to mature, need for additional procedures, death and stroke.  The patient agrees to proceed forward with the procedure.   DESCRIPTION: After written full informed consent was obtained from the patient, the patient was taken back to the operating room.  Prior to induction, the patient was given IV antibiotics.  After obtaining adequate sedation, the patient was prepped and draped in the standard fashion for a chest or neck tunneled dialysis catheter placement.  I transected the prior temporary dialysis catheter, revealing the lumens of the catheter.  The catheter was secured with a clamp and one lumen  was cannulated with the J-wire, which was advanced into the inferior vena cava under fluroscopic guidance.  The wire was then secured in place with a clamp to the drapes.  I then extended the neck incision with a 11-blade.  I exchanged the old residual dialysis catheter for the dilator-sheath, which was placed under fluroscopic guidance into the superior vena cava.  The dilator and wire were removed.  I loaded a 23 cm Palindrome catheter into the right atrium under fluoroscopic guidance.  The sheath was broken and peeled away while holding the catheter cuff at the level of the skin.    I made a stab incision at the chest exit site.   I dissected from the exit site to the neck site with a metal tunneler.   The back end of the catheter was transected and I loaded the catheter onto the metal tunneler.  I delivered the catheter through the subcutaneous tunnel to exit site.  The back end of this catheter was transected again, revealing the two lumens of this catheter.  The ports were docked onto these two lumens.  The catheter hub was then clicked into place.  Each port was tested by aspirating and flushing.  No resistance was noted.  Each port was then thoroughly flushed with heparinized saline.  The catheter was secured in placed with two interrupted stitches of 3-0 Nylon tied to the catheter.  The neck incision was closed with a U-stitch of 4-0 Monocryl.  The neck and chest incision were cleaned and sterile bandages applied.  Each port was then loaded with concentrated heparin (1000 Units/mL) at the manufacturer recommended volumes to each port.  Sterile caps were applied to  each port.  On completion fluoroscopy, the tips of the catheter were in the right atrium, and there was no evidence of pneumothorax.   At this point, the drapes were removed and the patient was repositioned for a left arm access procedure.  He was reprepped and redraped.  I turned my attention first to identifying the patient's distal  cephalic vein and radial artery.  Using SonoSite guidance, the location of these vessels were marked out on the skin.   I made a longitudinal incision at the level of the wrist and dissected through the subcutaneous tissue and fascia to gain exposure of the radial artery.  This was noted to be 2.5 mm in diameter externally.  This was dissected out proximally and distally and controlled with vessel loops .  I then dissected out the cephalic vein.  This was noted to be 3 mm in diameter externally.  The distal segment of the vein was ligated with a  2-0 silk, and the vein was transected.  The proximal segment was interrogated with serial dilators.  The vein accepted up to a 3.5 mm dilator without any difficulty.  I then instilled the heparinized saline into the vein and clamped it.  At this point, I reset my exposure of the radial artery and placed the artery under tension proximally and distally.  I made an arteriotomy with a #11 blade, and then I extended the arteriotomy with a Potts scissor.  I injected heparinized saline proximal and distal to this arteriotomy.  The vein was then sewn to the artery in an end-to-side configuration with a running stitch of 7-0 Prolene.  Prior to completing this anastomosis, I allowed the vein and artery to backbleed.  There was no evidence of clot from any vessels.  I completed the anastomosis in the usual fashion and then released all vessel loops and clamps.  There was weakly palpable  thrill in the venous outflow, and there was a palpable radial pulse.    At this point, I could see a large branch in distal 1/3 of the forearm.  I made a stab incision over the branch and dissected it out.  I tied this branch distal to its takeoff from the main cephalic vein with two 3-0 ties.  The vein branch was transected.  At this point, I irrigated out all surgical incisions.  There was no further active bleeding.  The subcutaneous tissue in all incisions was reapproximated with a running  stitch of 3-0 Vicryl.  The skin in both incisions was then reapproximated with a running subcuticular stitch of 4-0 Vicryl.  The skin in both incisions was then cleaned, dried, and reinforced with Dermabond.  The patient tolerated this procedure well.   COMPLICATIONS: none  CONDITION: stable   Adele Barthel, MD [facility] [place] Office: [phone] Pager: [phone]  09/19/2016, 11:06 AM

## 2016-09-19 NOTE — Progress Notes (Signed)
Lunch relief by S. Gregson RN 

## 2016-09-19 NOTE — Progress Notes (Signed)
PROGRESS NOTE    Timothy [name]  HGD:[id number] DOB: Jul 01, 1961 DOA: 09/09/2016 PCP: No PCP Per Patient   Chief Complaint  Patient presents with  . Shortness of Breath  . took unknown pill    Brief Narrative:  HPI on 09/09/2016 by Dr. Rush Landmark [facility]) [phone] male with hx dCHF, colon cancer, uncontrolled HTN, CKD, polysubstance abuse presented 11/25 with SOB, chest pain after taking unknown "pain pill" that he bought off the street.  In ER had HTN with SBP 200's despite nitro gtt, acute on chronic renal failure with Scr 8.5, BNP 2979, metabolic acidosis.  He had waxing and waning mental status and worsening SOB despite bipap and was intubated in ER.  Started on cardene and propofol gtt with slow improvement in BP.  PCCM called to admit.   Interim history Progressive CKD, now ESRD on HD.  Obtaining access today.  Assessment & Plan   Hypertensive emergency -Patient required Cardene drip -Currently on amlodipine, labetalol, bidil, as needed hydralazine  Acute respiratory failure with hypoxia and pulmonary edema secondary to Acute diastolic heart failure and worsening renal function -Noted to also have been in hypertensive crisis upon admission -Improving -Patient did require intubation -Echocardiogram showed EF of 89-21%  Acute metabolic encephalopathy -Likely multifactorial related to recent unknown medications presumed to be narcotic (responded briefly to Narcan), hypoxemia, uremia, and possible PRES -CT head showed no acute findings -Patient appears to be back to baseline mentation  Elevated troponin -Echocardiogram as above -Likely secondary to demand ischemia in the setting of severe hypertension, respiratory distress and worsening kidney failure  Acute renal failure superimposed on stage 5 chronic kidney disease, progressing -Baseline creatinine in 2016 was 1.5 -Upon admission, creatinine 8.5, BUN of 109 -Currently creatinine 9.56 -Nephrology consulted and  appreciated, started HD -Vein mapping ordered -Plan for vascular access today 09/19/16 -Patient feels that too much fluid is being removed and does not want to dialysis today  Heroin abuse/Polysubstance abuse -Upon admission, patient was lethargic and unable to provide adequate history regarding current use of hair when and when last used -Urine drug screen positive for opiates and THC -Monitor for withdrawal symptoms  Nonadherence to medical treatment -Unable to address at this juncture due to patient's metabolic encephalopathy and inability to participate with history  Anemia due to chronic kidney disease -Hemoglobin close to baseline, currently 11 -Continue to monitor CBC   Hepatitis C antibody positive -Patient will need outpatient follow-up  Physical deconditioning -PT initially recommended CIR -Inpatient rehabilitation consulted -Patient wishes to go home.  -Asked PT to re-evaulate the patient, now recommends [facility] PT  DVT Prophylaxis  heparin  Code Status: Full  Family Communication: None at bedside  Disposition Plan: Admitted. Plan for vascular access today 12/5.   Consultants Nephrology PCCM Interventional radiology PMR Vascular surgery  Procedures/ Significant Events 11/25  Admit with hypertensive crisis, acute on chronic renal failure, resp fx requiring intubation.  11/27  Remains on cardene gtt, intermittent agitation  11/24  Echocardiogram: moderate LVH, EF 60-65%, mild MR, patent PFO can not be excluded  11/28  Extubated 11/29  Remains on cardene, tolerated extubation  11/30  Placement of right IJ temp HD cath  Antibiotics   Anti-infectives    Start     Dose/Rate Route Frequency Ordered Stop   09/19/16 0600  [MAR Hold]  cefUROXime (ZINACEF) 1.5 g in dextrose 5 % 50 mL IVPB     (MAR Hold since 09/19/16 0908)   1.5 g 100 mL/hr over  30 Minutes Intravenous On call to O.R. 09/18/16 1511 09/20/16 0559   09/12/16 1030  levofloxacin (LEVAQUIN) IVPB 500 mg   Status:  Discontinued     500 mg 100 mL/hr over 60 Minutes Intravenous Every 48 hours 09/12/16 0954 09/13/16 1427      Subjective:   Timothy [name] seen and examined today.  Complains of sore throat.  Denies chest pain, shortness of breath, abdominal pain. Wants to know when he can go home.   Objective:   Vitals:   09/18/16 1700 09/18/16 2155 09/19/16 0646 09/19/16 0826  BP: (!) 160/107 134/90 (!) 168/102   Pulse: 73 79 70 70  Resp: 17 17 16    Temp: 98.7 F (37.1 C) 98 F (36.7 C) 97.7 F (36.5 C)   TempSrc: Oral Oral Oral   SpO2: 99% 98% 98%   Weight:  62.2 kg (137 lb 2 oz)    Height:        Intake/Output Summary (Last 24 hours) at 09/19/16 1058 Last data filed at 09/19/16 0836  Gross per 24 hour  Intake              717 ml  Output                0 ml  Net              717 ml   Filed Weights   09/16/16 2200 09/17/16 2157 09/18/16 2155  Weight: 60.8 kg (134 lb) 63.6 kg (140 lb 3.2 oz) 62.2 kg (137 lb 2 oz)    Exam (no change)  General: Well developed,thin, no distress  HEENT: NCAT, mucous membranes moist.   Cardiovascular: S1 S2 auscultated, 3/6 SEM, RRR  Respiratory: Diminished breath sounds  Abdomen: Soft, nontender, nondistended, + bowel sounds  Extremities: warm dry without cyanosis clubbing or edema  Neuro: AAOx3, nonfocal  Data Reviewed: I have personally reviewed following labs and imaging studies  CBC:  Recent Labs Lab 09/15/16 0309 09/16/16 1603 09/17/16 0531 09/18/16 0613 09/19/16 0659  WBC 13.6* 9.7 10.4 12.2* 11.0*  HGB 11.2* 11.8* 12.6* 11.6* 11.0*  HCT 31.5* 33.9* 36.1* 33.6* 31.7*  MCV 80.6 81.9 82.0 82.0 81.3  PLT 399 405* 362 320 270   Basic Metabolic Panel:  Recent Labs Lab 09/14/16 0313  09/14/16 1616 09/15/16 0309 09/16/16 1603 09/17/16 0531 09/18/16 0613 09/19/16 0659  NA 133*  --  134* 133* 130* 131* 129* 129*  K 4.2  --  4.3 3.8 3.4* 3.6 4.5 4.3  CL 96*  --  95* 95* 92* 96* 95* 95*  CO2 17*  --  16* 20* 23  21* 17* 16*  GLUCOSE 61*  --  95 82 109* 110* 81 79  BUN 161*  --  176* 108* 84* 67* 92* 126*  CREATININE 9.64*  --  10.38* 7.73* 7.21* 6.67* 8.16* 9.56*  CALCIUM 8.6*  < > 8.7* 8.7* 9.1 9.2 8.9 8.8*  PHOS 11.6*  --  11.5*  --  6.4*  --   --   --   < > = values in this interval not displayed. GFR: Estimated Creatinine Clearance: 7.7 mL/min (by C-G formula based on SCr of 9.56 mg/dL (H)). Liver Function Tests:  Recent Labs Lab 09/14/16 1616 09/16/16 1603  ALBUMIN 2.5* 2.7*   No results for input(s): LIPASE, AMYLASE in the last 168 hours. No results for input(s): AMMONIA in the last 168 hours. Coagulation Profile: No results for input(s): INR, PROTIME in the last 168 hours.  Cardiac Enzymes: No results for input(s): CKTOTAL, CKMB, CKMBINDEX, TROPONINI in the last 168 hours. BNP (last 3 results) No results for input(s): PROBNP in the last 8760 hours. HbA1C: No results for input(s): HGBA1C in the last 72 hours. CBG:  Recent Labs Lab 09/14/16 0656 09/14/16 0742 09/14/16 2119 09/15/16 0800 09/16/16 1945  GLUCAP 71 68 83 94 160*   Lipid Profile: No results for input(s): CHOL, HDL, LDLCALC, TRIG, CHOLHDL, LDLDIRECT in the last 72 hours. Thyroid Function Tests: No results for input(s): TSH, T4TOTAL, FREET4, T3FREE, THYROIDAB in the last 72 hours. Anemia Panel: No results for input(s): VITAMINB12, FOLATE, FERRITIN, TIBC, IRON, RETICCTPCT in the last 72 hours. Urine analysis:    Component Value Date/Time   COLORURINE YELLOW 09/13/2016 1100   APPEARANCEUR CLEAR 09/13/2016 1100   LABSPEC 1.011 09/13/2016 1100   PHURINE 5.0 09/13/2016 1100   GLUCOSEU NEGATIVE 09/13/2016 1100   HGBUR MODERATE (A) 09/13/2016 1100   BILIRUBINUR NEGATIVE 09/13/2016 1100   KETONESUR NEGATIVE 09/13/2016 1100   PROTEINUR 100 (A) 09/13/2016 1100   NITRITE NEGATIVE 09/13/2016 1100   LEUKOCYTESUR NEGATIVE 09/13/2016 1100   Sepsis Labs: @LABRCNTIP (procalcitonin:4,lacticidven:4)  ) Recent Results  (from the past 240 hour(s))  Surgical pcr screen     Status: Abnormal   Collection Time: 09/18/16  8:35 PM  Result Value Ref Range Status   MRSA, PCR NEGATIVE NEGATIVE Final   Staphylococcus aureus POSITIVE (A) NEGATIVE Final    Comment:        The Xpert SA Assay (FDA approved for NASAL specimens in patients over 57 years of age), is one component of a comprehensive surveillance program.  Test performance has been validated by [name] for patients greater than or equal to 72 year old. It is not intended to diagnose infection nor to guide or monitor treatment.       Radiology Studies: No results found.   Scheduled Meds: . [MAR Hold] amLODipine  10 mg Oral QHS  . [MAR Hold] calcitRIOL  0.5 mcg Oral QODAY  . [MAR Hold] calcium acetate  1,334 mg Oral TID WC  . [MAR Hold] carvedilol  25 mg Oral BID WC  . [MAR Hold] cefUROXime (ZINACEF)  IV  1.5 g Intravenous On Call to OR  . [MAR Hold] Chlorhexidine Gluconate Cloth  6 each Topical Daily  . [MAR Hold] feeding supplement (NEPRO CARB STEADY)  237 mL Oral TID BM  . [MAR Hold] heparin subcutaneous  5,000 Units Subcutaneous Q8H  . [MAR Hold] isosorbide-hydrALAZINE  1 tablet Oral TID  . [MAR Hold] LORazepam  0.5 mg Oral BID  . [MAR Hold] multivitamin  1 tablet Oral QHS  . [MAR Hold] mupirocin ointment  1 application Nasal BID  . [MAR Hold] tamsulosin  0.4 mg Oral Daily   Continuous Infusions: . sodium chloride       LOS: 10 days   Time Spent in minutes   30 minutes  Timothy [name] D.O. on 09/19/2016 at 10:58 AM  Between 7am to 7pm - Pager - [phone]  After 7pm go to [email or web address] - password [email or web address]  And look for the night coverage person covering for me after hours  [facility] Office  [phone]

## 2016-09-20 ENCOUNTER — Encounter (HOSPITAL_COMMUNITY): Payer: Self-pay | Admitting: Vascular Surgery

## 2016-09-20 ENCOUNTER — Telehealth: Payer: Self-pay | Admitting: Vascular Surgery

## 2016-09-20 DIAGNOSIS — E440 Moderate protein-calorie malnutrition: Secondary | ICD-10-CM | POA: Insufficient documentation

## 2016-09-20 LAB — CBC
HEMATOCRIT: 29.7 % — AB (ref 39.0–52.0)
HEMOGLOBIN: 10.1 g/dL — AB (ref 13.0–17.0)
MCH: 27.7 pg (ref 26.0–34.0)
MCHC: 34 g/dL (ref 30.0–36.0)
MCV: 81.4 fL (ref 78.0–100.0)
Platelets: 354 10*3/uL (ref 150–400)
RBC: 3.65 MIL/uL — ABNORMAL LOW (ref 4.22–5.81)
RDW: 12.6 % (ref 11.5–15.5)
WBC: 8.6 10*3/uL (ref 4.0–10.5)

## 2016-09-20 LAB — BASIC METABOLIC PANEL
Anion gap: 16 — ABNORMAL HIGH (ref 5–15)
BUN: 92 mg/dL — AB (ref 6–20)
CHLORIDE: 94 mmol/L — AB (ref 101–111)
CO2: 20 mmol/L — AB (ref 22–32)
CREATININE: 7.23 mg/dL — AB (ref 0.61–1.24)
Calcium: 8.1 mg/dL — ABNORMAL LOW (ref 8.9–10.3)
GFR calc Af Amer: 9 mL/min — ABNORMAL LOW (ref >60)
GFR calc non Af Amer: 8 mL/min — ABNORMAL LOW (ref >60)
GLUCOSE: 94 mg/dL (ref 65–99)
POTASSIUM: 3.8 mmol/L (ref 3.5–5.1)
SODIUM: 130 mmol/L — AB (ref 135–145)

## 2016-09-20 MED ORDER — SENNOSIDES-DOCUSATE SODIUM 8.6-50 MG PO TABS
1.0000 | ORAL_TABLET | Freq: Two times a day (BID) | ORAL | Status: DC
  Administered 2016-09-20 – 2016-09-21 (×2): 1 via ORAL
  Filled 2016-09-20 (×3): qty 1

## 2016-09-20 MED ORDER — MENTHOL 3 MG MT LOZG: 1.0000 | LOZENGE | OROMUCOSAL | Status: DC | PRN

## 2016-09-20 MED ORDER — GUAIFENESIN ER 600 MG PO TB12
600.0000 mg | ORAL_TABLET | Freq: Two times a day (BID) | ORAL | Status: DC
  Administered 2016-09-20 – 2016-09-21 (×3): 600 mg via ORAL
  Filled 2016-09-20 (×2): qty 1

## 2016-09-20 MED ORDER — GUAIFENESIN ER 600 MG PO TB12
1200.0000 mg | ORAL_TABLET | Freq: Two times a day (BID) | ORAL | Status: DC
  Filled 2016-09-20: qty 2

## 2016-09-20 MED ORDER — GUAIFENESIN ER 600 MG PO TB12: 600.0000 mg | ORAL_TABLET | Freq: Two times a day (BID) | ORAL | Status: DC

## 2016-09-20 MED ORDER — POLYETHYLENE GLYCOL 3350 17 G PO PACK
17.0000 g | PACK | Freq: Every day | ORAL | Status: DC
  Administered 2016-09-20 – 2016-09-21 (×2): 17 g via ORAL
  Filled 2016-09-20 (×2): qty 1

## 2016-09-20 NOTE — Progress Notes (Signed)
Patient want to wait till his wife bring clothes to do his CHG bath, will inform the oncoming nurse.

## 2016-09-20 NOTE — Telephone Encounter (Signed)
-----   Message from Denman George, RN sent at 09/19/2016  1:01 PM EST ----- Regarding: needs 4-6 week f/u with Dr. Bridgett Larsson; no duplex needed   ----- Message ----- From: Alvia Grove, PA-C Sent: 09/19/2016  12:25 PM To: Vvs Charge Pool  S/p left radial-cephalic AVF 71/2/45  F/u with Dr. Bridgett Larsson in 4-6 weeks. No duplex.  Thanks Maudie Mercury

## 2016-09-20 NOTE — Telephone Encounter (Signed)
Sched appt 10/24/16 at 8:30. Lm on hm# to inform pt of appt.

## 2016-09-20 NOTE — Progress Notes (Signed)
Physical Therapy Treatment Patient Details Name: Timothy [name] MRN: [id number] DOB: 1961/06/19 Today's Date: 09/20/2016    History of Present Illness 55yo male with hx dCHF, colon cancer, uncontrolled HTN, CKD, polysubstance abuse presented 11/25 with SOB, chest pain after taking unknown "pain pill" that he bought off the street.  In ER had HTN with SBP 200's despite nitro gtt, acute on chronic renal failure with Scr 8.5, BNP 3267, metabolic acidosis.  He had waxing and waning mental status and worsening SOB despite bipap and was intubated in ER.  Started on cardene and propofol gtt with slow improvement in BP.     PT Comments    Pt progressing well spontaneously, gait stability improving, but still mildly unsteady at times.  DGI 22/24 and suggests low potential for falls.  Pt has been mobilizing in halls independently today.  Follow Up Recommendations  Home health PT;Supervision - Intermittent     Equipment Recommendations  None recommended by PT    Recommendations for Other Services       Precautions / Restrictions      Mobility  Bed Mobility Overal bed mobility: Modified Independent                Transfers Overall transfer level: Modified independent   Transfers: Sit to/from Stand              Ambulation/Gait Ambulation/Gait assistance: Supervision Ambulation Distance (Feet): 400 Feet Assistive device: None Gait Pattern/deviations: Step-through pattern Gait velocity: decreased Gait velocity interpretation: at or above normal speed for age/gender General Gait Details: generally steady though mild scissoring   Stairs Stairs: Yes Stairs assistance: Modified independent (Device/Increase time) Stair Management: No rails;Alternating pattern;Forwards Number of Stairs: 5 General stair comments: completed steps without rails, but would be safer with rail  Wheelchair Mobility    Modified Rankin (Stroke Patients Only)       Balance     Sitting  balance-Leahy Scale: Good       Standing balance-Leahy Scale: Good                      Cognition Arousal/Alertness: Awake/alert Behavior During Therapy: Flat affect;Agitated Overall Cognitive Status: No family/caregiver present to determine baseline cognitive functioning         Following Commands: Follows multi-step commands with increased time;Follows multi-step commands inconsistently   Awareness: Emergent        Exercises      General Comments        Pertinent Vitals/Pain Pain Assessment: No/denies pain    Home Living                      Prior Function            PT Goals (current goals can now be found in the care plan section) Acute Rehab PT Goals Patient Stated Goal: leave the hospital PT Goal Formulation: With patient Time For Goal Achievement: 09/20/16 Potential to Achieve Goals: Good Progress towards PT goals: Progressing toward goals    Frequency    Min 3X/week      PT Plan Current plan remains appropriate    Co-evaluation             End of Session   Activity Tolerance: Patient limited by fatigue Patient left: in chair;with call bell/phone within reach     Time: 1554-1606 PT Time Calculation (min) (ACUTE ONLY): 12 min  Charges:  $Gait Training: 8-22 mins  G CodesTessie Fass Erik Nessel 09/20/2016, 4:12 PM  09/20/2016  Donnella Sham, [place] [phone]  (pager)

## 2016-09-20 NOTE — Progress Notes (Signed)
Patient ID: Timothy [name], male   DOB: March 21, 1961, 55 y.o.   MRN: [id number] S:feels sore O:BP 132/81 (BP Location: Right Arm)   Pulse 70   Temp 99 F (37.2 C) (Oral)   Resp 18   Ht 6\' 1"  (1.854 m)   Wt 64.4 kg (142 lb)   SpO2 99%   BMI 18.73 kg/m   Intake/Output Summary (Last 24 hours) at 09/20/16 0905 Last data filed at 09/19/16 2321  Gross per 24 hour  Intake             1340 ml  Output             1050 ml  Net              290 ml   Intake/Output: I/O last 3 completed shifts: In: 9024 [P.O.:1080; I.V.:500] Out: 1050 [Other:1000; Blood:50]  Intake/Output this shift:  No intake/output data recorded. Weight change: 2.8 kg (6 lb 2.8 oz) Gen:wd wn aam in nad CVS:no rub Resp:cta OXB:DZHGDJ Ext:no edema LUE AVF +T/B   Recent Labs Lab 09/14/16 0313  09/14/16 1616 09/15/16 0309 09/16/16 1603 09/17/16 0531 09/18/16 0613 09/19/16 0659 09/20/16 0606  NA 133*  --  134* 133* 130* 131* 129* 129* 130*  K 4.2  --  4.3 3.8 3.4* 3.6 4.5 4.3 3.8  CL 96*  --  95* 95* 92* 96* 95* 95* 94*  CO2 17*  --  16* 20* 23 21* 17* 16* 20*  GLUCOSE 61*  --  95 82 109* 110* 81 79 94  BUN 161*  --  176* 108* 84* 67* 92* 126* 92*  CREATININE 9.64*  --  10.38* 7.73* 7.21* 6.67* 8.16* 9.56* 7.23*  ALBUMIN  --   --  2.5*  --  2.7*  --   --   --   --   CALCIUM 8.6*  < > 8.7* 8.7* 9.1 9.2 8.9 8.8* 8.1*  PHOS 11.6*  --  11.5*  --  6.4*  --   --   --   --   < > = values in this interval not displayed. Liver Function Tests:  Recent Labs Lab 09/14/16 1616 09/16/16 1603  ALBUMIN 2.5* 2.7*   No results for input(s): LIPASE, AMYLASE in the last 168 hours. No results for input(s): AMMONIA in the last 168 hours. CBC:  Recent Labs Lab 09/16/16 1603 09/17/16 0531 09/18/16 0613 09/19/16 0659 09/20/16 0606  WBC 9.7 10.4 12.2* 11.0* 8.6  HGB 11.8* 12.6* 11.6* 11.0* 10.1*  HCT 33.9* 36.1* 33.6* 31.7* 29.7*  MCV 81.9 82.0 82.0 81.3 81.4  PLT 405* 362 320 395 354   Cardiac Enzymes: No  results for input(s): CKTOTAL, CKMB, CKMBINDEX, TROPONINI in the last 168 hours. CBG:  Recent Labs Lab 09/14/16 0656 09/14/16 0742 09/14/16 2119 09/15/16 0800 09/16/16 1945  GLUCAP 71 68 83 94 160*    Iron Studies: No results for input(s): IRON, TIBC, TRANSFERRIN, FERRITIN in the last 72 hours. Studies/Results: Dg Chest Port 1 View  Result Date: 09/19/2016 CLINICAL DATA:  Dialysis catheter placement. EXAM: PORTABLE CHEST 1 VIEW COMPARISON:  09/12/2016. FINDINGS: Interim extubation removal of NG tube. Right IJ dialysis catheter with tip at cavoatrial junction. Cardiomegaly with normal pulmonary vascularity. Mild right base subsegmental atelectasis. Interim continued partial clearing of right base infiltrate . IMPRESSION: 1. Interim extubation and removal of NG tube. Interim placement of right IJ dialysis catheter, its tip is at the cavoatrial junction. 2. Interim continued clearing of right base infiltrate. Mild  right base subsegmental atelectasis Electronically Signed   By: Marcello Moores  Register   On: 09/19/2016 13:20   Dg Fluoro Guide Cv Line-no Report  Result Date: 09/19/2016 There is no report for this exam.  . amLODipine  10 mg Oral QHS  . calcitRIOL  0.5 mcg Oral QODAY  . calcium acetate  1,334 mg Oral TID WC  . carvedilol  25 mg Oral BID WC  . Chlorhexidine Gluconate Cloth  6 each Topical Daily  . feeding supplement (NEPRO CARB STEADY)  237 mL Oral TID BM  . heparin subcutaneous  5,000 Units Subcutaneous Q8H  . isosorbide-hydrALAZINE  1 tablet Oral TID  . LORazepam  0.5 mg Oral BID  . multivitamin  1 tablet Oral QHS  . mupirocin ointment  1 application Nasal BID  . tamsulosin  0.4 mg Oral Daily    BMET    Component Value Date/Time   NA 130 (L) 09/20/2016 0606   K 3.8 09/20/2016 0606   CL 94 (L) 09/20/2016 0606   CO2 20 (L) 09/20/2016 0606   GLUCOSE 94 09/20/2016 0606   BUN 92 (H) 09/20/2016 0606   CREATININE 7.23 (H) 09/20/2016 0606   CREATININE 1.59 (H) 07/29/2015  0943   CALCIUM 8.1 (L) 09/20/2016 0606   CALCIUM 8.4 (L) 09/14/2016 1615   GFRNONAA 8 (L) 09/20/2016 0606   GFRNONAA 48 (L) 07/29/2015 0943   GFRAA 9 (L) 09/20/2016 0606   GFRAA 56 (L) 07/29/2015 0943   CBC    Component Value Date/Time   WBC 8.6 09/20/2016 0606   RBC 3.65 (L) 09/20/2016 0606   HGB 10.1 (L) 09/20/2016 0606   HCT 29.7 (L) 09/20/2016 0606   PLT 354 09/20/2016 0606   MCV 81.4 09/20/2016 0606   MCH 27.7 09/20/2016 0606   MCHC 34.0 09/20/2016 0606   RDW 12.6 09/20/2016 0606   LYMPHSABS 1.9 07/29/2015 0943   MONOABS 0.8 07/29/2015 0943   EOSABS 0.1 07/29/2015 0943   BASOSABS 0.0 07/29/2015 0943     Assessment/Plan:  1. ESRD- continue with HD on TTS schedule. Will need permanent access placed and outpatient dialysis to be arranged. 2. Substance abuse 3. HTN- stable 4. HPTH- on vit D and binders 5. Anemia of CKD- stable 6. Vascular access- s/p RIJ TDC and LAVF placement today by Dr. Bridgett Larsson.  No evidence of steal and good thrill/bruit.  Appreciate Dr. Lianne Moris assistance.  Donetta Potts, MD [employer] [phone]

## 2016-09-20 NOTE — Progress Notes (Signed)
[facility]  Subjective  -  Does not responding verbally.   Objective 132/81 65 99 F (37.2 C) (Oral) 18 99%  Intake/Output Summary (Last 24 hours) at 09/20/16 0829 Last data filed at 09/19/16 2321  Gross per 24 hour  Intake             1340 ml  Output             1050 ml  Net              290 ml    Left radial cephalic fistula with great thrill, incision healing well.  Assessment/Planning: POD # 1  PROCEDURE: 1.  right internal jugular vein tunneled dialysis catheter exchange 2.  Left radiocephalic arteriovenous fistula  3.  Side branch ligation of cephalic vein F/U in our office in 6-8 weeks  Laurence Slate [facility] 09/20/2016 8:29 AM --  Laboratory Lab Results:  Recent Labs  09/19/16 0659 09/20/16 0606  WBC 11.0* 8.6  HGB 11.0* 10.1*  HCT 31.7* 29.7*  PLT 395 354   BMET  Recent Labs  09/19/16 0659 09/20/16 0606  NA 129* 130*  K 4.3 3.8  CL 95* 94*  CO2 16* 20*  GLUCOSE 79 94  BUN 126* 92*  CREATININE 9.56* 7.23*  CALCIUM 8.8* 8.1*    COAG No results found for: INR, PROTIME No results found for: PTT

## 2016-09-20 NOTE — Progress Notes (Signed)
Nutrition Follow-up  DOCUMENTATION CODES:   Non-severe (moderate) malnutrition in context of chronic illness   Pt meets criteria for moderate MALNUTRITION in the context of chronic illness as evidenced by moderate fat mass loss and moderate to severe muscle mass loss.  INTERVENTION:  Continue Nepro Shake po TID, each supplement provides 425 kcal and 19 grams protein.  Encourage adequate PO intake.   NUTRITION DIAGNOSIS:   Inadequate oral intake related to other (see comment) (altered taste) as evidenced by per patient/family report; improving  GOAL:   Patient will meet greater than or equal to 90% of their needs; met  MONITOR:   PO intake, Supplement acceptance, Labs  REASON FOR ASSESSMENT:   Ventilator    ASSESSMENT:   55yo male with hx dCHF, colon cancer, uncontrolled HTN, CKD, polysubstance abuse presented 11/25 with SOB, chest pain after taking unknown "pain pill" that he bought off the street.  In ER had HTN with SBP 200's despite nitro gtt, acute on chronic renal failure with Scr 8.5, BNP 4695, metabolic acidosis.  He had waxing and waning mental status and worsening SOB despite bipap and was intubated in ER.  Started on cardene and propofol gtt with slow improvement in BP.   Meal completion has been varied from 15-75%. Pt reports he has not been able to consuming all of his foods at meals due to feeling of constipation. Miralax has been ordered. Pt currently has Nepro Shake ordered and has been consuming them. RD to continue with current orders. Noted pt with weight loss, however likely related to fluid status.  Nutrition-Focused physical exam completed. Findings are moderate fat depletion, moderate to severe muscle depletion, and no edema.   Labs and medications reviewed.   Diet Order:  Diet renal/carb modified with fluid restriction Diet-HS Snack? Nothing; Room service appropriate? Yes; Fluid consistency: Thin  Skin:   (Incision on L arm, R neck)  Last BM:   12/4  Height:   Ht Readings from Last 1 Encounters:  09/20/16 '6\' 1"'  (1.854 m)    Weight:   Wt Readings from Last 1 Encounters:  09/20/16 142 lb (64.4 kg)    Ideal Body Weight:  86.36 kg  BMI:  Body mass index is 18.73 kg/m.  Estimated Nutritional Needs:   Kcal:  2000-2300  Protein:  85-100 gm  Fluid:  1.2 L  EDUCATION NEEDS:   No education needs identified at this time  Corrin Parker, MS, RD, LDN Pager # [phone] After hours/ weekend pager # [phone]

## 2016-09-20 NOTE — Progress Notes (Signed)
PROGRESS NOTE    Timothy [name]  FTD:[id number] DOB: May 15, 1961 DOA: 09/09/2016 PCP: No PCP Per Patient   Chief Complaint  Patient presents with  . Shortness of Breath  . took unknown pill    Brief Narrative:  HPI on 09/09/2016 by Dr. [facility]) [phone] male with hx dCHF, colon cancer, uncontrolled HTN, CKD, polysubstance abuse presented 11/25 with SOB, chest pain after taking unknown "pain pill" that he bought off the street.  In ER had HTN with SBP 200's despite nitro gtt, acute on chronic renal failure with Scr 8.5, BNP 7628, metabolic acidosis.  He had waxing and waning mental status and worsening SOB despite bipap and was intubated in ER.  Started on cardene and propofol gtt with slow improvement in BP.  PCCM called to admit.   Interim history Progressive CKD, now ESRD on HD.   Assessment & Plan   Hypertensive emergency -Patient required Cardene drip -Currently on amlodipine, labetalol, bidil, as needed hydralazine  Acute respiratory failure with hypoxia and pulmonary edema secondary to Acute diastolic heart failure and worsening renal function -Noted to also have been in hypertensive crisis upon admission -Improving -Patient did require intubation -Echocardiogram showed EF of 31-51%  Acute metabolic encephalopathy -Likely multifactorial related to recent unknown medications presumed to be narcotic (responded briefly to Narcan), hypoxemia, uremia, and possible PRES -CT head showed no acute findings -Patient appears to be back to baseline mentation  Elevated troponin -Echocardiogram as above -Likely secondary to demand ischemia in the setting of severe hypertension, respiratory distress and worsening kidney failure  Acute renal failure superimposed on stage 5 chronic kidney disease, progressing -Baseline creatinine in 2016 was 1.5 -Upon admission, creatinine 8.5, BUN of 109 -Currently creatinine 9.56 -Nephrology consulted and appreciated, started  HD -Vein mapping ordered -vascular access 09/19/16  Heroin abuse/Polysubstance abuse -Upon admission, patient was lethargic and unable to provide adequate history regarding current use of hair when and when last used -Urine drug screen positive for opiates and THC -Monitor for withdrawal symptoms  Nonadherence to medical treatment -Unable to address at this juncture due to patient's metabolic encephalopathy and inability to participate with history  Anemia due to chronic kidney disease -Hemoglobin close to baseline, currently 11 -Continue to monitor CBC   Hepatitis C antibody positive -Patient will need outpatient follow-up  Physical deconditioning -PT initially recommended CIR -Inpatient rehabilitation consulted -Patient wishes to go home.  -Asked PT to re-evaulate the patient, now recommends [facility] PT  DVT Prophylaxis  heparin  Code Status: Full  Family Communication: None at bedside  Disposition Plan: Admitted.  Consultants Nephrology PCCM Interventional radiology PMR Vascular surgery  Procedures/ Significant Events 11/25  Admit with hypertensive crisis, acute on chronic renal failure, resp fx requiring intubation.  11/27  Remains on cardene gtt, intermittent agitation  11/24  Echocardiogram: moderate LVH, EF 60-65%, mild MR, patent PFO can not be excluded  11/28  Extubated 11/29  Remains on cardene, tolerated extubation  11/30  Placement of right IJ temp HD cath  Antibiotics   Anti-infectives    Start     Dose/Rate Route Frequency Ordered Stop   09/19/16 0600  cefUROXime (ZINACEF) 1.5 g in dextrose 5 % 50 mL IVPB     1.5 g 100 mL/hr over 30 Minutes Intravenous On call to O.R. 09/18/16 1511 09/19/16 1059   09/12/16 1030  levofloxacin (LEVAQUIN) IVPB 500 mg  Status:  Discontinued     500 mg 100 mL/hr over 60 Minutes Intravenous Every 48  hours 09/12/16 0954 09/13/16 1427      Subjective:  Complains about dry cough as well as sore throat.  Objective:    Vitals:   09/19/16 1856 09/20/16 0625 09/20/16 0838 09/20/16 0911  BP: (!) 150/88 132/81  (!) 148/87  Pulse: (!) 55 65 70 68  Resp: 18 18  18   Temp: 98 F (36.7 C) 99 F (37.2 C)  98.5 F (36.9 C)  TempSrc: Oral Oral  Oral  SpO2: 96% 99%  99%  Weight:  64.4 kg (142 lb)    Height:  6\' 1"  (1.854 m)      Intake/Output Summary (Last 24 hours) at 09/20/16 1846 Last data filed at 09/20/16 1828  Gross per 24 hour  Intake             1560 ml  Output                0 ml  Net             1560 ml   Filed Weights   09/19/16 1415 09/19/16 1745 09/20/16 0625  Weight: 65 kg (143 lb 4.8 oz) 64 kg (141 lb 1.5 oz) 64.4 kg (142 lb)    Exam (no change)  General: Well developed,thin, no distress  HEENT: NCAT, mucous membranes moist.   Cardiovascular: S1 S2 auscultated, 3/6 SEM, RRR  Respiratory: Diminished breath sounds  Abdomen: Soft, nontender, nondistended, + bowel sounds  Extremities: warm dry without cyanosis clubbing or edema  Neuro: AAOx3, nonfocal  Data Reviewed: I have personally reviewed following labs and imaging studies  CBC:  Recent Labs Lab 09/16/16 1603 09/17/16 0531 09/18/16 0613 09/19/16 0659 09/20/16 0606  WBC 9.7 10.4 12.2* 11.0* 8.6  HGB 11.8* 12.6* 11.6* 11.0* 10.1*  HCT 33.9* 36.1* 33.6* 31.7* 29.7*  MCV 81.9 82.0 82.0 81.3 81.4  PLT 405* 362 320 395 194   Basic Metabolic Panel:  Recent Labs Lab 09/14/16 0313  09/14/16 1616  09/16/16 1603 09/17/16 0531 09/18/16 0613 09/19/16 0659 09/20/16 0606  NA 133*  --  134*  < > 130* 131* 129* 129* 130*  K 4.2  --  4.3  < > 3.4* 3.6 4.5 4.3 3.8  CL 96*  --  95*  < > 92* 96* 95* 95* 94*  CO2 17*  --  16*  < > 23 21* 17* 16* 20*  GLUCOSE 61*  --  95  < > 109* 110* 81 79 94  BUN 161*  --  176*  < > 84* 67* 92* 126* 92*  CREATININE 9.64*  --  10.38*  < > 7.21* 6.67* 8.16* 9.56* 7.23*  CALCIUM 8.6*  < > 8.7*  < > 9.1 9.2 8.9 8.8* 8.1*  PHOS 11.6*  --  11.5*  --  6.4*  --   --   --   --   < > =  values in this interval not displayed. GFR: Estimated Creatinine Clearance: 10.5 mL/min (by C-G formula based on SCr of 7.23 mg/dL (H)). Liver Function Tests:  Recent Labs Lab 09/14/16 1616 09/16/16 1603  ALBUMIN 2.5* 2.7*   No results for input(s): LIPASE, AMYLASE in the last 168 hours. No results for input(s): AMMONIA in the last 168 hours. Coagulation Profile: No results for input(s): INR, PROTIME in the last 168 hours. Cardiac Enzymes: No results for input(s): CKTOTAL, CKMB, CKMBINDEX, TROPONINI in the last 168 hours. BNP (last 3 results) No results for input(s): PROBNP in the last 8760 hours. HbA1C: No results  for input(s): HGBA1C in the last 72 hours. CBG:  Recent Labs Lab 09/14/16 0656 09/14/16 0742 09/14/16 2119 09/15/16 0800 09/16/16 1945  GLUCAP 71 68 83 94 160*   Lipid Profile: No results for input(s): CHOL, HDL, LDLCALC, TRIG, CHOLHDL, LDLDIRECT in the last 72 hours. Thyroid Function Tests: No results for input(s): TSH, T4TOTAL, FREET4, T3FREE, THYROIDAB in the last 72 hours. Anemia Panel: No results for input(s): VITAMINB12, FOLATE, FERRITIN, TIBC, IRON, RETICCTPCT in the last 72 hours. Urine analysis:    Component Value Date/Time   COLORURINE YELLOW 09/13/2016 1100   APPEARANCEUR CLEAR 09/13/2016 1100   LABSPEC 1.011 09/13/2016 1100   PHURINE 5.0 09/13/2016 1100   GLUCOSEU NEGATIVE 09/13/2016 1100   HGBUR MODERATE (A) 09/13/2016 1100   BILIRUBINUR NEGATIVE 09/13/2016 1100   KETONESUR NEGATIVE 09/13/2016 1100   PROTEINUR 100 (A) 09/13/2016 1100   NITRITE NEGATIVE 09/13/2016 1100   LEUKOCYTESUR NEGATIVE 09/13/2016 1100   Sepsis Labs: @LABRCNTIP (procalcitonin:4,lacticidven:4)  ) Recent Results (from the past 240 hour(s))  Surgical pcr screen     Status: Abnormal   Collection Time: 09/18/16  8:35 PM  Result Value Ref Range Status   MRSA, PCR NEGATIVE NEGATIVE Final   Staphylococcus aureus POSITIVE (A) NEGATIVE Final    Comment:        The  Xpert SA Assay (FDA approved for NASAL specimens in patients over 45 years of age), is one component of a comprehensive surveillance program.  Test performance has been validated by [facility] for patients greater than or equal to 33 year old. It is not intended to diagnose infection nor to guide or monitor treatment.       Radiology Studies: Dg Chest Port 1 View  Result Date: 09/19/2016 CLINICAL DATA:  Dialysis catheter placement. EXAM: PORTABLE CHEST 1 VIEW COMPARISON:  09/12/2016. FINDINGS: Interim extubation removal of NG tube. Right IJ dialysis catheter with tip at cavoatrial junction. Cardiomegaly with normal pulmonary vascularity. Mild right base subsegmental atelectasis. Interim continued partial clearing of right base infiltrate . IMPRESSION: 1. Interim extubation and removal of NG tube. Interim placement of right IJ dialysis catheter, its tip is at the cavoatrial junction. 2. Interim continued clearing of right base infiltrate. Mild right base subsegmental atelectasis Electronically Signed   By: Marcello Moores  Register   On: 09/19/2016 13:20   Dg Fluoro Guide Cv Line-no Report  Result Date: 09/19/2016 There is no report for this exam.    Scheduled Meds: . amLODipine  10 mg Oral QHS  . calcitRIOL  0.5 mcg Oral QODAY  . calcium acetate  1,334 mg Oral TID WC  . carvedilol  25 mg Oral BID WC  . Chlorhexidine Gluconate Cloth  6 each Topical Daily  . feeding supplement (NEPRO CARB STEADY)  237 mL Oral TID BM  . guaiFENesin  600 mg Oral BID  . heparin subcutaneous  5,000 Units Subcutaneous Q8H  . isosorbide-hydrALAZINE  1 tablet Oral TID  . LORazepam  0.5 mg Oral BID  . multivitamin  1 tablet Oral QHS  . mupirocin ointment  1 application Nasal BID  . polyethylene glycol  17 g Oral Daily  . senna-docusate  1 tablet Oral BID  . tamsulosin  0.4 mg Oral Daily   Continuous Infusions: . sodium chloride       LOS: 11 days   Time Spent in minutes   30 minutes Author: Berle Mull, MD [facility] 09/20/2016 6:48 PM    After 7pm go to [email or web address] - password [email or web address]  And  look for the night coverage person covering for me after hours  [facility] Office  [phone]

## 2016-09-21 LAB — RENAL FUNCTION PANEL
ANION GAP: 16 — AB (ref 5–15)
Albumin: 2.5 g/dL — ABNORMAL LOW (ref 3.5–5.0)
BUN: 111 mg/dL — ABNORMAL HIGH (ref 6–20)
CALCIUM: 8.1 mg/dL — AB (ref 8.9–10.3)
CO2: 19 mmol/L — AB (ref 22–32)
Chloride: 96 mmol/L — ABNORMAL LOW (ref 101–111)
Creatinine, Ser: 7.94 mg/dL — ABNORMAL HIGH (ref 0.61–1.24)
GFR calc non Af Amer: 7 mL/min — ABNORMAL LOW (ref >60)
GFR, EST AFRICAN AMERICAN: 8 mL/min — AB (ref >60)
Glucose, Bld: 90 mg/dL (ref 65–99)
Phosphorus: 7.2 mg/dL — ABNORMAL HIGH (ref 2.5–4.6)
Potassium: 3.8 mmol/L (ref 3.5–5.1)
SODIUM: 131 mmol/L — AB (ref 135–145)

## 2016-09-21 LAB — CBC
HCT: 27.6 % — ABNORMAL LOW (ref 39.0–52.0)
HEMOGLOBIN: 9.7 g/dL — AB (ref 13.0–17.0)
MCH: 28.5 pg (ref 26.0–34.0)
MCHC: 35.1 g/dL (ref 30.0–36.0)
MCV: 81.2 fL (ref 78.0–100.0)
Platelets: 375 10*3/uL (ref 150–400)
RBC: 3.4 MIL/uL — AB (ref 4.22–5.81)
RDW: 12.7 % (ref 11.5–15.5)
WBC: 8.3 10*3/uL (ref 4.0–10.5)

## 2016-09-21 MED ORDER — POLYETHYLENE GLYCOL 3350 17 G PO PACK: 17.0000 g | PACK | Freq: Every day | ORAL | 0 refills | Status: DC

## 2016-09-21 MED ORDER — RENA-VITE PO TABS: 1.0000 | ORAL_TABLET | Freq: Every day | ORAL | 0 refills | Status: DC

## 2016-09-21 MED ORDER — NEPRO/CARBSTEADY PO LIQD: 237.0000 mL | Freq: Three times a day (TID) | ORAL | 0 refills | Status: DC

## 2016-09-21 MED ORDER — CALCITRIOL 0.5 MCG PO CAPS: 0.5000 ug | ORAL_CAPSULE | ORAL | 0 refills | Status: DC

## 2016-09-21 MED ORDER — CARVEDILOL 25 MG PO TABS: 25.0000 mg | ORAL_TABLET | Freq: Two times a day (BID) | ORAL | 0 refills | Status: DC

## 2016-09-21 MED ORDER — ZOLPIDEM TARTRATE 5 MG PO TABS: 5.0000 mg | ORAL_TABLET | Freq: Every evening | ORAL | 0 refills | Status: DC | PRN

## 2016-09-21 MED ORDER — CALCIUM ACETATE (PHOS BINDER) 667 MG PO CAPS: 1334.0000 mg | ORAL_CAPSULE | Freq: Three times a day (TID) | ORAL | 0 refills | Status: DC

## 2016-09-21 MED ORDER — TRAZODONE HCL 50 MG PO TABS: 50.0000 mg | ORAL_TABLET | Freq: Every day | ORAL | 0 refills | Status: DC

## 2016-09-21 MED ORDER — SENNOSIDES-DOCUSATE SODIUM 8.6-50 MG PO TABS: 1.0000 | ORAL_TABLET | Freq: Every evening | ORAL | 0 refills | Status: DC | PRN

## 2016-09-21 MED ORDER — GUAIFENESIN ER 600 MG PO TB12: 600.0000 mg | ORAL_TABLET | Freq: Two times a day (BID) | ORAL | 0 refills | Status: DC

## 2016-09-21 MED FILL — POLYETHYLENE GLYCOL 3350: 15 days supply | Qty: 255 | Fill #0

## 2016-09-21 MED FILL — CALCITRIOL 0.5 MCG CAPSULE: 0.5 | 30 days supply | Qty: 15 | Fill #0

## 2016-09-21 MED FILL — CARVEDILOL 25 MG TABLET: 25 | 30 days supply | Qty: 60 | Fill #0

## 2016-09-21 MED FILL — traZODone HCL 50 MG TABS: 50 | 30 days supply | Qty: 30 | Fill #0

## 2016-09-21 MED FILL — RENA-VITE TABLET: 3 days supply | Qty: 3 | Fill #0

## 2016-09-21 MED FILL — CALCIUM ACETATE 667 MG CAP: 667 | 10 days supply | Qty: 60 | Fill #0

## 2016-09-21 NOTE — Procedures (Signed)
I was present at this dialysis session. I have reviewed the session itself and made appropriate changes.   Filed Weights   09/20/16 0625 09/20/16 2339 09/21/16 0705  Weight: 64.4 kg (142 lb) 70.4 kg (155 lb 3.3 oz) 67.9 kg (149 lb 11.1 oz)     Recent Labs Lab 09/21/16 0331  NA 131*  K 3.8  CL 96*  CO2 19*  GLUCOSE 90  BUN 111*  CREATININE 7.94*  CALCIUM 8.1*  PHOS 7.2*     Recent Labs Lab 09/19/16 0659 09/20/16 0606 09/21/16 0720  WBC 11.0* 8.6 8.3  HGB 11.0* 10.1* 9.7*  HCT 31.7* 29.7* 27.6*  MCV 81.3 81.4 81.2  PLT 395 354 375    Scheduled Meds: . amLODipine  10 mg Oral QHS  . calcitRIOL  0.5 mcg Oral QODAY  . calcium acetate  1,334 mg Oral TID WC  . carvedilol  25 mg Oral BID WC  . Chlorhexidine Gluconate Cloth  6 each Topical Daily  . feeding supplement (NEPRO CARB STEADY)  237 mL Oral TID BM  . guaiFENesin  600 mg Oral BID  . heparin subcutaneous  5,000 Units Subcutaneous Q8H  . isosorbide-hydrALAZINE  1 tablet Oral TID  . LORazepam  0.5 mg Oral BID  . multivitamin  1 tablet Oral QHS  . mupirocin ointment  1 application Nasal BID  . polyethylene glycol  17 g Oral Daily  . senna-docusate  1 tablet Oral BID  . tamsulosin  0.4 mg Oral Daily   Continuous Infusions: PRN Meds:.acetaminophen, alteplase, hydrALAZINE, HYDROcodone-acetaminophen, ipratropium-albuterol, lidocaine (PF), menthol-cetylpyridinium, traZODone, zolpidem    Assessment/Plan:  1. ESRD- He has been set up for outpatient HD at [facility] MWF second shift.  He is s/p permanent access and has had 3 HD treatments.  Stable for discharge from renal standpoint with outpatient HD to start tomorrow for full 4 hours and bfr 400/dfr 800. edw 68kg, std heparin. 2. Substance abuse 3. HTN- stable 4. HPTH- on vit D and binders 5. Anemia of CKD- stable 6. Vascular access- s/p RIJ TDC and LAVF placement today by Dr. Bridgett Larsson. No evidence of steal and good thrill/bruit. Appreciate Dr. Lianne Moris  assistance. 7. Disposition- stable for discharge today after hd.  Donetta Potts,  MD 09/21/2016, 9:10 AM

## 2016-09-21 NOTE — Progress Notes (Signed)
Timothy [name] to be D/C'd Home per MD order.  Discussed prescriptions and follow up appointments with the patient. Prescriptions given to patient, medication list explained in detail. Pt verbalized understanding.    Medication List    STOP taking these medications   labetalol 200 MG tablet Commonly known as:  NORMODYNE   labetalol 300 MG tablet Commonly known as:  NORMODYNE   spironolactone 25 MG tablet Commonly known as:  ALDACTONE     TAKE these medications   acetaminophen 500 MG tablet Commonly known as:  TYLENOL Take 1,500 mg by mouth every 6 (six) hours as needed for headache (pain).   amLODipine 10 MG tablet Commonly known as:  NORVASC Take 1 tablet (10 mg total) by mouth daily.   calcitRIOL 0.5 MCG capsule Commonly known as:  ROCALTROL Take 1 capsule (0.5 mcg total) by mouth every other day. Start taking on:  09/23/2016   calcium acetate 667 MG capsule Commonly known as:  PHOSLO Take 2 capsules (1,334 mg total) by mouth 3 (three) times daily with meals.   carvedilol 25 MG tablet Commonly known as:  COREG Take 1 tablet (25 mg total) by mouth 2 (two) times daily with a meal.   cyclobenzaprine 10 MG tablet Commonly known as:  FLEXERIL Take 1 tablet (10 mg total) by mouth 3 (three) times daily as needed for muscle spasms.   feeding supplement (NEPRO CARB STEADY) Liqd Take 237 mLs by mouth 3 (three) times daily between meals.   folic acid 1 MG tablet Commonly known as:  FOLVITE Take 1 tablet (1 mg total) by mouth daily.   guaiFENesin 600 MG 12 hr tablet Commonly known as:  MUCINEX Take 1 tablet (600 mg total) by mouth 2 (two) times daily.   HYDROcodone-acetaminophen 5-325 MG tablet Commonly known as:  NORCO/VICODIN Take 1 tablet by mouth every 4 (four) hours as needed for severe pain.   isosorbide-hydrALAZINE 20-37.5 MG tablet Commonly known as:  BIDIL Take 1 tablet by mouth 2 (two) times daily.   multivitamin Tabs tablet Take 1 tablet by mouth at  bedtime.   multivitamin with minerals Tabs tablet Take 1 tablet by mouth daily. What changed:  additional instructions   polyethylene glycol packet Commonly known as:  MIRALAX / GLYCOLAX Take 17 g by mouth daily. Start taking on:  09/22/2016   pravastatin 40 MG tablet Commonly known as:  PRAVACHOL Take 1 tablet (40 mg total) by mouth daily.   senna-docusate 8.6-50 MG tablet Commonly known as:  Senokot-S Take 1 tablet by mouth at bedtime as needed for mild constipation.   tamsulosin 0.4 MG Caps capsule Commonly known as:  FLOMAX Take 1 capsule (0.4 mg total) by mouth daily.   thiamine 100 MG tablet Take 1 tablet (100 mg total) by mouth daily.   traZODone 50 MG tablet Commonly known as:  DESYREL Take 1 tablet (50 mg total) by mouth at bedtime.   zolpidem 5 MG tablet Commonly known as:  AMBIEN Take 1 tablet (5 mg total) by mouth at bedtime as needed for sleep.       Vitals:   09/21/16 1030 09/21/16 1044  BP: (!) 154/86 138/75  Pulse: 66 66  Resp:  18  Temp:  98.2 F (36.8 C)    Skin clean, dry and intact without evidence of skin break down, no evidence of skin tears noted. IV catheter discontinued intact. Site without signs and symptoms of complications. Dressing and pressure applied. Pt denies pain at this time. No complaints  noted.  An After Visit Summary was printed and given to the patient. Patient escorted via [place], and D/C home via private auto.  Retta Mac BSN, RN

## 2016-09-21 NOTE — Care Management Note (Addendum)
Case Management Note  Patient Details  Name: Timothy [name] MRN: [id number] Date of Birth: 1961/07/30  Subjective/Objective:   HTN emergency, Acute resp failure with hypoxia, CHF,  Acute Renal Failure, CKD V, on HD, [place] MWF second shift                Action/Plan: Discharge Planning: AVS reviewed: NCM spoke to pt and wife at bedside. No Needs identified. He can afford his medications. Wife states plan is to switch to a different PCP. Explained he needs to continue with his PCP until it has been approved through his Medicaid.   PCP Dr. [name]  Expected Discharge Date:                  Expected Discharge Plan:  Home/Self Care  In-House Referral:  NA  Discharge planning Services  CM Consult  Post Acute Care Choice:  NA Choice offered to:  NA  DME Arranged:  N/A DME Agency:  NA  HH Arranged:  NA HH Agency:  NA  Status of Service:  Completed, signed off  If discussed at [place] of Stay Meetings, dates discussed:    Additional Comments:  Erenest Rasher, RN 09/21/2016, 2:30 PM

## 2016-09-22 DIAGNOSIS — N189 Chronic kidney disease, unspecified: Secondary | ICD-10-CM | POA: Insufficient documentation

## 2016-09-22 DIAGNOSIS — D500 Iron deficiency anemia secondary to blood loss (chronic): Secondary | ICD-10-CM | POA: Insufficient documentation

## 2016-09-22 DIAGNOSIS — D689 Coagulation defect, unspecified: Secondary | ICD-10-CM | POA: Insufficient documentation

## 2016-09-22 DIAGNOSIS — N2581 Secondary hyperparathyroidism of renal origin: Secondary | ICD-10-CM | POA: Insufficient documentation

## 2016-09-22 DIAGNOSIS — Z4901 Encounter for fitting and adjustment of extracorporeal dialysis catheter: Secondary | ICD-10-CM | POA: Insufficient documentation

## 2016-09-22 DIAGNOSIS — I119 Hypertensive heart disease without heart failure: Secondary | ICD-10-CM | POA: Insufficient documentation

## 2016-09-22 DIAGNOSIS — D631 Anemia in chronic kidney disease: Secondary | ICD-10-CM | POA: Insufficient documentation

## 2016-09-22 MED FILL — AMLODIPINE BESYLATE 10 MG T: 10 | 30 days supply | Qty: 30 | Fill #0

## 2016-09-25 NOTE — Discharge Summary (Signed)
[facility] Discharge Summary   Patient: Timothy [name] BHA:[id number]   PCP: [name], MD DOB: 14-May-1961   Date of admission: 09/09/2016   Date of discharge: 09/21/2016     Discharge Diagnoses:  Principal Problem:   Hypertensive emergency Active Problems:   Acute diastolic heart failure, NYHA class 2 (HCC)   Heroin abuse   Acute metabolic encephalopathy   Acute renal failure superimposed on stage 5 chronic kidney disease, not on chronic dialysis ([place])   Acute respiratory failure with hypoxia (HCC)   Acute pulmonary edema (HCC)   Polysubstance abuse   Nonadherence to medical treatment   Anemia due to chronic kidney disease   Altered mental status   AKI (acute kidney injury) ([place])   History of colon cancer   Hyponatremia   Leukocytosis   Acute blood loss anemia   Malnutrition of moderate degree   Admitted From: home Disposition:  home with home health  Recommendations for Outpatient Follow-up:  1. Follow-up with PCP in one week. 2. Continue hemodialysis as recommended. 3. Follow up with vascular surgery as recommended   Follow-up Information    Adele Barthel, MD Follow up in 6 week(s).   Specialties:  Vascular Surgery, Cardiology Why:  Our office will call you to arrange an appointment (sent) Contact information: [place] [address] [phone]        PCP. Schedule an appointment as soon as possible for a visit in 1 week(s).        [facility]. Go on 09/22/2016.   Contact information: [place] [address] [phone]          Diet recommendation: Renal diet  Activity: The patient is advised to gradually reintroduce usual activities.  Discharge Condition: good  Code Status: Full code  History of present illness: As per the H and P dictated on admission, "55yo male with hx dCHF, colon cancer, uncontrolled HTN, CKD, polysubstance abuse presented 11/25 with SOB, chest pain after taking  unknown "pain pill" that he bought off the street.  In ER had HTN with SBP 200's despite nitro gtt, acute on chronic renal failure with Scr 8.5, BNP 4174, metabolic acidosis.  He had waxing and waning mental status and worsening SOB despite bipap and was intubated in ER.  Started on cardene and propofol gtt with slow improvement in BP.  PCCM called to admit. "  Hospital Course:   Summary of his active problems in the hospital is as following. Hypertensive emergency -Patient required Cardene drip -Currently on amlodipine, labetalol, bidil,   Acute respiratory failure with hypoxia and pulmonary edema secondary to Acute diastolic heart failure and worsening renal function -Noted to also have been in hypertensive crisis upon admission -Improving -Patient did require intubation -Echocardiogram showed EF of 08-14%  Acute metabolic encephalopathy -Likely multifactorial related to recent unknown medications presumed to be narcotic (responded briefly to Narcan), hypoxemia, uremia, and possible PRES -CT head showed no acute findings -Patient appears to be back to baseline mentation  Elevated troponin -Echocardiogram as above -Likely secondary to demand ischemia in the setting of severe hypertension, respiratory distress and worsening kidney failure  Acute renal failure superimposed on stage 5 chronic kidney disease, progressing -Baseline creatinine in 2016 was 1.5 -Upon admission, creatinine 8.5, BUN of 109 -Currently creatinine 9.56 -Nephrology consulted and appreciated, started HD -Vein mapping ordered -vascular access 09/19/16  Heroin abuse/Polysubstance abuse -Upon admission, patient was lethargic and unable to provide adequate history regarding current use of hair when and  when last used -Urine drug screen positive for opiates and THC - Given the patient's ESRD status now he requires a [facility] placement. Patient will need to be monitored outpatient given his history of substance  abuse.  Nonadherence to medical treatment -Unable to address at this juncture due to patient's metabolic encephalopathy and inability to participate with history  Anemia due to chronic kidney disease -Hemoglobin close to baseline, currently 11 -Continue to monitor CBC as an outpatient  Hepatitis C antibody positive -Patient will need outpatient ID referral  Physical deconditioning -PT initially recommended CIR -Inpatient rehabilitation consulted -Patient wishes to go home.  -Asked PT to re-evaulate the patient, now recommends [facility] PT  All other chronic medical condition were stable during the hospitalization.  Patient was seen by physical therapy, who recommended home health, which was arranged by Education officer, museum and case Freight forwarder. On the day of the discharge the patient's vitals were stable, and no other acute medical condition were reported by patient. the patient was felt safe to be discharge at home with home health.  Procedures and Results:  Intubation and extubation  Echocardiogram  Right IJ temporary HD cath  [facility] cath placement.  Hemodialysis  Consultations:  Nephrology  VC CM  Interventional radiology  Physical rehabilitation  Vascular surgery  DISCHARGE MEDICATION: Discharge Medication List as of 09/21/2016 11:19 AM    START taking these medications   Details  calcitRIOL (ROCALTROL) 0.5 MCG capsule Take 1 capsule (0.5 mcg total) by mouth every other day., Starting Sat 09/23/2016, Normal    calcium acetate (PHOSLO) 667 MG capsule Take 2 capsules (1,334 mg total) by mouth 3 (three) times daily with meals., Starting Thu 09/21/2016, Normal    carvedilol (COREG) 25 MG tablet Take 1 tablet (25 mg total) by mouth 2 (two) times daily with a meal., Starting Thu 09/21/2016, Normal    guaiFENesin (MUCINEX) 600 MG 12 hr tablet Take 1 tablet (600 mg total) by mouth 2 (two) times daily., Starting Thu 09/21/2016, Normal    multivitamin (RENA-VIT) TABS tablet Take 1  tablet by mouth at bedtime., Starting Thu 09/21/2016, Normal    Nutritional Supplements (FEEDING SUPPLEMENT, NEPRO CARB STEADY,) LIQD Take 237 mLs by mouth 3 (three) times daily between meals., Starting Thu 09/21/2016, Normal    polyethylene glycol (MIRALAX / GLYCOLAX) packet Take 17 g by mouth daily., Starting Fri 09/22/2016, Normal    senna-docusate (SENOKOT-S) 8.6-50 MG tablet Take 1 tablet by mouth at bedtime as needed for mild constipation., Starting Thu 09/21/2016, Normal    traZODone (DESYREL) 50 MG tablet Take 1 tablet (50 mg total) by mouth at bedtime., Starting Thu 09/21/2016, Normal    zolpidem (AMBIEN) 5 MG tablet Take 1 tablet (5 mg total) by mouth at bedtime as needed for sleep., Starting Thu 09/21/2016, Print      CONTINUE these medications which have NOT CHANGED   Details  acetaminophen (TYLENOL) 500 MG tablet Take 1,500 mg by mouth every 6 (six) hours as needed for headache (pain)., Historical Med    amLODipine (NORVASC) 10 MG tablet Take 1 tablet (10 mg total) by mouth daily., Starting Sat 07/22/2016, Normal    cyclobenzaprine (FLEXERIL) 10 MG tablet Take 1 tablet (10 mg total) by mouth 3 (three) times daily as needed for muscle spasms., Starting Tue 8/92/1194, Print    folic acid (FOLVITE) 1 MG tablet Take 1 tablet (1 mg total) by mouth daily., Starting Sun 07/23/2016, Print    HYDROcodone-acetaminophen (NORCO/VICODIN) 5-325 MG tablet Take 1 tablet by mouth every  4 (four) hours as needed for severe pain., Starting Sat 07/22/2016, Print    isosorbide-hydrALAZINE (BIDIL) 20-37.5 MG tablet Take 1 tablet by mouth 2 (two) times daily., Starting Sat 07/22/2016, Print    Multiple Vitamin (MULTIVITAMIN WITH MINERALS) TABS tablet Take 1 tablet by mouth daily., Starting Sun 07/23/2016, Print    pravastatin (PRAVACHOL) 40 MG tablet Take 1 tablet (40 mg total) by mouth daily., Starting Mon 09/13/2015, Normal    tamsulosin (FLOMAX) 0.4 MG CAPS capsule Take 1 capsule (0.4 mg total) by mouth  daily., Starting Mon 09/13/2015, Normal    thiamine 100 MG tablet Take 1 tablet (100 mg total) by mouth daily., Starting Sun 07/23/2016, Print      STOP taking these medications     labetalol (NORMODYNE) 200 MG tablet      labetalol (NORMODYNE) 300 MG tablet      spironolactone (ALDACTONE) 25 MG tablet        Allergies  Allergen Reactions  . No Known Allergies    Discharge Instructions    Discharge instructions    Complete by:  As directed    It is important that you read following instructions as well as go over your medication list with RN to help you understand your care after this hospitalization.  Discharge Instructions: Please follow-up with PCP in one week  Please request your primary care physician to go over all Hospital Tests and Procedure/Radiological results at the follow up,  Please get all Hospital records sent to your PCP by signing hospital release before you go home.   Do not take more than prescribed Pain, Sleep and Anxiety Medications. You were cared for by a hospitalist during your hospital stay. If you have any questions about your discharge medications or the care you received while you were in the hospital after you are discharged, you can call the unit and ask to speak with the hospitalist on call if the hospitalist that took care of you is not available.  Once you are discharged, your primary care physician will handle any further medical issues. Please note that NO REFILLS for any discharge medications will be authorized once you are discharged, as it is imperative that you return to your primary care physician (or establish a relationship with a primary care physician if you do not have one) for your aftercare needs so that they can reassess your need for medications and monitor your lab values. You Must read complete instructions/literature along with all the possible adverse reactions/side effects for all the Medicines you take and that have been  prescribed to you. Take any new Medicines after you have completely understood and accept all the possible adverse reactions/side effects. Wear Seat belts while driving. If you have smoked or chewed Tobacco in the last 2 yrs please stop smoking and/or stop any Recreational drug use.   Increase activity slowly    Complete by:  As directed      Discharge Exam: Filed Weights   09/20/16 2339 09/21/16 0705 09/21/16 1044  Weight: 70.4 kg (155 lb 3.3 oz) 67.9 kg (149 lb 11.1 oz) 66.5 kg (146 lb 9.7 oz)   Vitals:   09/21/16 1030 09/21/16 1044  BP: (!) 154/86 138/75  Pulse: 66 66  Resp:  18  Temp:  98.2 F (36.8 C)   General: Appear in no distress, no Rash; Oral Mucosa moist. Cardiovascular: S1 and S2 Present, no Murmur, no JVD Respiratory: Bilateral Air entry present and Clear to Auscultation, no Crackles, no wheezes Abdomen:  Bowel Sound present, Soft and no tenderness Extremities: no Pedal edema, no calf tenderness Neurology: Grossly no focal neuro deficit.  The results of significant diagnostics from this hospitalization (including imaging, microbiology, ancillary and laboratory) are listed below for reference.    Significant Diagnostic Studies: Ct Head Wo Contrast  Result Date: 09/09/2016 CLINICAL DATA:  Hypertensive emergency.  Shortness of breath. EXAM: CT HEAD WITHOUT CONTRAST TECHNIQUE: Contiguous axial images were obtained from the base of the skull through the vertex without intravenous contrast. COMPARISON:  None. FINDINGS: Brain: Ventricles are normal in size and configuration. All areas of the brain demonstrate normal gray-white matter attenuation. There is no mass, hemorrhage, edema or other evidence of acute parenchymal abnormality. No extra-axial hemorrhage. Vascular: There are chronic calcified atherosclerotic changes of the large vessels at the skull base. No unexpected hyperdense vessel. Skull: Normal. Negative for fracture or focal lesion. Sinuses/Orbits: Mild mucosal  thickening within the ethmoid air cells. No fluid levels or other signs of acute sinusitis. Periorbital and retro-orbital soft tissues are unremarkable. Other: None. IMPRESSION: No acute findings.  No intracranial mass, hemorrhage or edema. Electronically Signed   By: Franki Cabot M.D.   On: 09/09/2016 11:02   Ir Fluoro Guide Cv Line Right  Result Date: 09/14/2016 INDICATION: 55 year old male with a history of acute kidney insufficiency superimposed on chronic kidney disease. EXAM: TUNNELED CENTRAL VENOUS HEMODIALYSIS CATHETER PLACEMENT WITH ULTRASOUND AND FLUOROSCOPIC GUIDANCE MEDICATIONS: None ANESTHESIA/SEDATION: None FLUOROSCOPY TIME:  Fluoroscopy Time: 0 minutes 6 seconds (1 mGy). COMPLICATIONS: None PROCEDURE: Informed written consent was obtained from the patient and the patient's family after a thorough discussion of the procedural risks, benefits and alternatives. All questions were addressed. A timeout was performed prior to the initiation of the procedure. The right neck and chest was prepped with chlorhexidine, and draped in the usual sterile fashion using maximum barrier technique (cap and mask, sterile gown, sterile gloves, large sterile sheet, hand hygiene and cutaneous antiseptic). Local anesthesia was attained by infiltration with 1% lidocaine without epinephrine. Ultrasound demonstrated patency of the right internal jugular vein, and this was documented with an image. Under real-time ultrasound guidance, this vein was accessed with a 21 gauge micropuncture needle and image documentation was performed. A small dermatotomy was made at the access site with an 11 scalpel. A 0.018" wire was advanced into the SVC and the access needle exchanged for a 50F micropuncture vascular sheath. The 0.018" wire was then removed and a 0.035" wire advanced into the IVC. Upon withdrawal of the 018 wire, the wire was marked for appropriate length of the internal portion of the catheter. A 19 cm catheter was  selected. Skin and subcutaneous tissues were serially dilated. Catheter was placed on the wire. The catheter tip is positioned in the upper right atrium. This was documented with a spot image. Both ports of the hemodialysis catheter were then tested for excellent function. The ports were then locked with heparinized lock. Patient tolerated the procedure well and remained hemodynamically stable throughout. No complications were encountered and no significant blood loss was encountered. IMPRESSION: Status post right IJ approach temporary HD catheter placement. Catheter ready for use. Signed, Dulcy Fanny. Earleen Newport, DO [facility] Radiology Electronically Signed   By: [name] D.O.   On: 09/14/2016 12:49   Ir US Guide Vasc Access Right  Result Date: 09/14/2016 INDICATION: 55 year old male with a history of acute kidney insufficiency superimposed on chronic kidney disease. EXAM: TUNNELED CENTRAL VENOUS HEMODIALYSIS CATHETER PLACEMENT WITH ULTRASOUND  AND FLUOROSCOPIC GUIDANCE MEDICATIONS: None ANESTHESIA/SEDATION: None FLUOROSCOPY TIME:  Fluoroscopy Time: 0 minutes 6 seconds (1 mGy). COMPLICATIONS: None PROCEDURE: Informed written consent was obtained from the patient and the patient's family after a thorough discussion of the procedural risks, benefits and alternatives. All questions were addressed. A timeout was performed prior to the initiation of the procedure. The right neck and chest was prepped with chlorhexidine, and draped in the usual sterile fashion using maximum barrier technique (cap and mask, sterile gown, sterile gloves, large sterile sheet, hand hygiene and cutaneous antiseptic). Local anesthesia was attained by infiltration with 1% lidocaine without epinephrine. Ultrasound demonstrated patency of the right internal jugular vein, and this was documented with an image. Under real-time ultrasound guidance, this vein was accessed with a 21 gauge  micropuncture needle and image documentation was performed. A small dermatotomy was made at the access site with an 11 scalpel. A 0.018" wire was advanced into the SVC and the access needle exchanged for a 66F micropuncture vascular sheath. The 0.018" wire was then removed and a 0.035" wire advanced into the IVC. Upon withdrawal of the 018 wire, the wire was marked for appropriate length of the internal portion of the catheter. A 19 cm catheter was selected. Skin and subcutaneous tissues were serially dilated. Catheter was placed on the wire. The catheter tip is positioned in the upper right atrium. This was documented with a spot image. Both ports of the hemodialysis catheter were then tested for excellent function. The ports were then locked with heparinized lock. Patient tolerated the procedure well and remained hemodynamically stable throughout. No complications were encountered and no significant blood loss was encountered. IMPRESSION: Status post right IJ approach temporary HD catheter placement. Catheter ready for use. Signed, Dulcy Fanny. Earleen Newport, DO [facility] Radiology Electronically Signed   By: [name] D.O.   On: 09/14/2016 12:49   Dg Chest Port 1 View  Result Date: 09/19/2016 CLINICAL DATA:  Dialysis catheter placement. EXAM: PORTABLE CHEST 1 VIEW COMPARISON:  09/12/2016. FINDINGS: Interim extubation removal of NG tube. Right IJ dialysis catheter with tip at cavoatrial junction. Cardiomegaly with normal pulmonary vascularity. Mild right base subsegmental atelectasis. Interim continued partial clearing of right base infiltrate . IMPRESSION: 1. Interim extubation and removal of NG tube. Interim placement of right IJ dialysis catheter, its tip is at the cavoatrial junction. 2. Interim continued clearing of right base infiltrate. Mild right base subsegmental atelectasis Electronically Signed   By: [place]   On: 09/19/2016 13:20   Dg Chest Port  1 View  Result Date: 09/12/2016 CLINICAL DATA:  Respiratory failure. EXAM: PORTABLE CHEST 1 VIEW COMPARISON:  09/11/2016. FINDINGS: Endotracheal tube and NG tube in stable position. Interim partial clearing of right right base infiltrate. Interim i partial resolution right pleural effusion. No pneumothorax stable cardiomegaly. No acute bony abnormality . IMPRESSION: 1. Lines and tubes in stable position. 2. Interim partial clearing of right lower lobe infiltrate and right pleural effusion. Electronically Signed   By: Marcello Moores  Register   On: 09/12/2016 07:44   Dg Chest Port 1 View  Result Date: 09/11/2016 CLINICAL DATA:  Respiratory failure. EXAM: PORTABLE CHEST 1 VIEW COMPARISON:  09/10/2016. FINDINGS: Endotracheal tube and NG tube noted with tip projected under the left hemidiaphragm. Cardiomegaly with normal pulmonary vascularity. Right lower lobe infiltrate and right pleural effusion. Previously identified atelectatic changes left lung base have partially cleared. No pneumothorax . IMPRESSION: 1. Endotracheal tube and NG tube in stable position. 2.  Right lower lobe infiltrate consistent with pneumonia. Small right pleural effusion. Previously identified atelectatic changes in the left lung base have partially cleared . 3. Stable cardiomegaly. Electronically Signed   By: Marcello Moores  Register   On: 09/11/2016 12:24   Dg Chest Port 1 View  Result Date: 09/10/2016 CLINICAL DATA:  Respiratory failure, check endotracheal tube EXAM: PORTABLE CHEST 1 VIEW COMPARISON:  09/09/2016 FINDINGS: Cardiac shadow is stable. Endotracheal tube and nasogastric catheter are again noted in satisfactory position. Increased density is noted in the left retrocardiac region consistent with consolidation. No sizable effusion is seen. No pneumothorax is noted. IMPRESSION: Increasing left retrocardiac consolidation. Electronically Signed   By: Inez Catalina M.D.   On: 09/10/2016 07:08   Dg Chest Portable 1 View  Result Date:  09/09/2016 CLINICAL DATA:  Intubation EXAM: PORTABLE CHEST 1 VIEW COMPARISON:  Chest x-ray from earlier same day and chest x-ray dated 07/19/2016. FINDINGS: Endotracheal tube has been placed with tip located at the level of the clavicles, approximately 7 cm above the carina. Enteric tube passes below the diaphragm although the proximal side holes are at the level of the gastroesophageal junction. There is mild cardiomegaly. The central pulmonary vascular congestion and bilateral pulmonary edema pattern is stable or slightly improved. No pleural effusion or pneumothorax seen. IMPRESSION: 1. Endotracheal tube is adequately positioned with tip at the level of the clavicles, but tip is approximately 7 cm above the carina. Would consider advancing approximately 4-5 cm for more optimal radiographic positioning. 2. Enteric tube passes below the diaphragm, however, proximal side holes are at the level of the gastroesophageal junction. Recommend advancing at least 5 cm for more optimal radiographic positioning. 3. Cardiomegaly with central pulmonary vascular congestion and bilateral pulmonary edema pattern, consistent with CHF/volume overload, stable or slightly improved compared to the chest x-ray from earlier today. Electronically Signed   By: Franki Cabot M.D.   On: 09/09/2016 09:33   Dg Chest Portable 1 View  Result Date: 09/09/2016 CLINICAL DATA:  Shortness of breath after taking an unknown pain pill. EXAM: PORTABLE CHEST 1 VIEW COMPARISON:  07/19/2016 FINDINGS: Cardiac enlargement with pulmonary vascular congestion and diffuse interstitial edema. Edema pattern is increased since the previous study. No focal consolidation. No blunting of costophrenic angles. No pneumothorax. Calcification of the aorta. IMPRESSION: Cardiac enlargement with pulmonary vascular congestion and increased edema since previous study. Electronically Signed   By: Lucienne Capers M.D.   On: 09/09/2016 06:06   Dg Abd Portable 1  View  Result Date: 09/09/2016 CLINICAL DATA:  OG tube placement. EXAM: PORTABLE ABDOMEN - 1 VIEW COMPARISON:  None. FINDINGS: Enteric tube passes below the diaphragm, however, proximal side holes are at the level of the gastroesophageal junction. Visualized bowel gas pattern is nonobstructive. No evidence of free intraperitoneal air seen. IMPRESSION: Enteric tube is adequately positioned in the stomach but proximal side holes are at the level of the gastroesophageal junction. Recommend advancing 5-10 cm for more optimal radiographic positioning. Electronically Signed   By: Franki Cabot M.D.   On: 09/09/2016 09:34   Dg Fluoro Guide Cv Line-no Report  Result Date: 09/19/2016 There is no report for this exam.   Microbiology: Recent Results (from the past 240 hour(s))  Surgical pcr screen     Status: Abnormal   Collection Time: 09/18/16  8:35 PM  Result Value Ref Range Status   MRSA, PCR NEGATIVE NEGATIVE Final   Staphylococcus aureus POSITIVE (A) NEGATIVE Final    Comment:  The Xpert SA Assay (FDA approved for NASAL specimens in patients over 55 years of age), is one component of a comprehensive surveillance program.  Test performance has been validated by [facility] for patients greater than or equal to 2 year old. It is not intended to diagnose infection nor to guide or monitor treatment.      Labs: CBC:  Recent Labs Lab 09/19/16 0659 09/20/16 0606 09/21/16 0720  WBC 11.0* 8.6 8.3  HGB 11.0* 10.1* 9.7*  HCT 31.7* 29.7* 27.6*  MCV 81.3 81.4 81.2  PLT 395 354 753   Basic Metabolic Panel:  Recent Labs Lab 09/19/16 0659 09/20/16 0606 09/21/16 0331  NA 129* 130* 131*  K 4.3 3.8 3.8  CL 95* 94* 96*  CO2 16* 20* 19*  GLUCOSE 79 94 90  BUN 126* 92* 111*  CREATININE 9.56* 7.23* 7.94*  CALCIUM 8.8* 8.1* 8.1*  PHOS  --   --  7.2*   Liver Function Tests:  Recent Labs Lab 09/21/16 0331  ALBUMIN 2.5*   BNP (last 3 results)  Recent Labs   07/17/16 1138 09/09/16 0537  BNP 3,281.4* 3,212.6*   CBG: No results for input(s): GLUCAP in the last 168 hours. Time spent: 30 minutes  Signed:  Polk Minor  [facility] 09/21/2016 , 8:09 AM

## 2016-10-11 MED FILL — GABAPENTIN 300 MG CAPSULE: 300 | 30 days supply | Qty: 30 | Fill #0

## 2016-10-11 MED FILL — CALCIUM ACETATE 667 MG CAP: 667 | 30 days supply | Qty: 270 | Fill #0

## 2016-10-11 MED FILL — HYDROXYZINE PAM 25 MG CAP: 25 | 30 days supply | Qty: 30 | Fill #0

## 2016-10-18 MED FILL — AMLODIPINE BESYLATE 10 MG T: 10 | 30 days supply | Qty: 30 | Fill #1

## 2016-10-26 ENCOUNTER — Emergency Department (HOSPITAL_COMMUNITY): Payer: Medicaid Other

## 2016-10-26 ENCOUNTER — Emergency Department (HOSPITAL_COMMUNITY)
Admission: EM | Admit: 2016-10-26 | Discharge: 2016-10-26 | Disposition: A | Discharge status: Home / Self Care | Payer: Medicaid Other | Attending: Emergency Medicine | Admitting: Emergency Medicine

## 2016-10-26 ENCOUNTER — Encounter (HOSPITAL_COMMUNITY): Payer: Self-pay

## 2016-10-26 DIAGNOSIS — R05 Cough: Secondary | ICD-10-CM | POA: Diagnosis present

## 2016-10-26 DIAGNOSIS — F17210 Nicotine dependence, cigarettes, uncomplicated: Secondary | ICD-10-CM | POA: Diagnosis not present

## 2016-10-26 DIAGNOSIS — Z79899 Other long term (current) drug therapy: Secondary | ICD-10-CM | POA: Insufficient documentation

## 2016-10-26 DIAGNOSIS — Z86010 Personal history of colonic polyps: Secondary | ICD-10-CM | POA: Insufficient documentation

## 2016-10-26 DIAGNOSIS — J111 Influenza due to unidentified influenza virus with other respiratory manifestations: Secondary | ICD-10-CM | POA: Diagnosis not present

## 2016-10-26 DIAGNOSIS — I5031 Acute diastolic (congestive) heart failure: Secondary | ICD-10-CM | POA: Diagnosis not present

## 2016-10-26 DIAGNOSIS — I132 Hypertensive heart and chronic kidney disease with heart failure and with stage 5 chronic kidney disease, or end stage renal disease: Secondary | ICD-10-CM | POA: Insufficient documentation

## 2016-10-26 DIAGNOSIS — Z85038 Personal history of other malignant neoplasm of large intestine: Secondary | ICD-10-CM | POA: Insufficient documentation

## 2016-10-26 DIAGNOSIS — N186 End stage renal disease: Secondary | ICD-10-CM | POA: Insufficient documentation

## 2016-10-26 DIAGNOSIS — J101 Influenza due to other identified influenza virus with other respiratory manifestations: Secondary | ICD-10-CM

## 2016-10-26 DIAGNOSIS — Z992 Dependence on renal dialysis: Secondary | ICD-10-CM | POA: Diagnosis not present

## 2016-10-26 LAB — COMPREHENSIVE METABOLIC PANEL
ALBUMIN: 3.7 g/dL (ref 3.5–5.0)
ALT: 32 U/L (ref 17–63)
ANION GAP: 14 (ref 5–15)
AST: 29 U/L (ref 15–41)
Alkaline Phosphatase: 61 U/L (ref 38–126)
BUN: 32 mg/dL — AB (ref 6–20)
CHLORIDE: 99 mmol/L — AB (ref 101–111)
CO2: 22 mmol/L (ref 22–32)
Calcium: 9.4 mg/dL (ref 8.9–10.3)
Creatinine, Ser: 4.91 mg/dL — ABNORMAL HIGH (ref 0.61–1.24)
GFR calc Af Amer: 14 mL/min — ABNORMAL LOW (ref >60)
GFR, EST NON AFRICAN AMERICAN: 12 mL/min — AB (ref >60)
Glucose, Bld: 104 mg/dL — ABNORMAL HIGH (ref 65–99)
Potassium: 3.8 mmol/L (ref 3.5–5.1)
Sodium: 135 mmol/L (ref 135–145)
Total Bilirubin: 0.7 mg/dL (ref 0.3–1.2)
Total Protein: 7.9 g/dL (ref 6.5–8.1)

## 2016-10-26 LAB — CBC WITH DIFFERENTIAL/PLATELET
Basophils Absolute: 0 10*3/uL (ref 0.0–0.1)
Basophils Relative: 0 %
EOS PCT: 0 %
Eosinophils Absolute: 0 10*3/uL (ref 0.0–0.7)
HEMATOCRIT: 40.5 % (ref 39.0–52.0)
HEMOGLOBIN: 13.5 g/dL (ref 13.0–17.0)
LYMPHS ABS: 1.1 10*3/uL (ref 0.7–4.0)
LYMPHS PCT: 8 %
MCH: 29.5 pg (ref 26.0–34.0)
MCHC: 33.3 g/dL (ref 30.0–36.0)
MCV: 88.4 fL (ref 78.0–100.0)
MONOS PCT: 7 %
Monocytes Absolute: 1 10*3/uL (ref 0.1–1.0)
Neutro Abs: 11.7 10*3/uL — ABNORMAL HIGH (ref 1.7–7.7)
Neutrophils Relative %: 85 %
PLATELETS: 281 10*3/uL (ref 150–400)
RBC: 4.58 MIL/uL (ref 4.22–5.81)
RDW: 15.2 % (ref 11.5–15.5)
WBC: 13.8 10*3/uL — AB (ref 4.0–10.5)

## 2016-10-26 LAB — INFLUENZA PANEL BY PCR (TYPE A & B)
INFLAPCR: POSITIVE — AB
Influenza B By PCR: NEGATIVE

## 2016-10-26 LAB — I-STAT CG4 LACTIC ACID, ED: Lactic Acid, Venous: 1.36 mmol/L (ref 0.5–1.9)

## 2016-10-26 MED ORDER — OSELTAMIVIR PHOSPHATE 30 MG PO CAPS
30.0000 mg | ORAL_CAPSULE | Freq: Once | ORAL | Status: AC
  Administered 2016-10-26: 30 mg via ORAL
  Filled 2016-10-26: qty 1

## 2016-10-26 MED ORDER — ACETAMINOPHEN 500 MG PO TABS: 1000.0000 mg | ORAL_TABLET | Freq: Four times a day (QID) | ORAL | 0 refills | Status: DC | PRN

## 2016-10-26 MED ORDER — HYDROCODONE-HOMATROPINE 5-1.5 MG/5ML PO SYRP: 5.0000 mL | ORAL_SOLUTION | Freq: Four times a day (QID) | ORAL | 0 refills | Status: DC | PRN

## 2016-10-26 MED ORDER — IPRATROPIUM-ALBUTEROL 0.5-2.5 (3) MG/3ML IN SOLN
3.0000 mL | Freq: Once | RESPIRATORY_TRACT | Status: AC
  Administered 2016-10-26: 3 mL via RESPIRATORY_TRACT
  Filled 2016-10-26: qty 3

## 2016-10-26 MED ORDER — OSELTAMIVIR PHOSPHATE 30 MG PO CAPS: ORAL_CAPSULE | ORAL | 0 refills | Status: DC

## 2016-10-26 MED ORDER — HYDROCODONE-ACETAMINOPHEN 5-325 MG PO TABS
1.0000 | ORAL_TABLET | Freq: Once | ORAL | Status: AC
  Administered 2016-10-26: 1 via ORAL
  Filled 2016-10-26: qty 1

## 2016-10-26 NOTE — ED Notes (Signed)
Went to give pt. An  Urinal.  Pt. Stated, "I don't make urine."

## 2016-10-26 NOTE — ED Notes (Signed)
Pt laying in bed, NAD, wife at bedside. Plan of care given to patient. No needs at this time.

## 2016-10-26 NOTE — ED Triage Notes (Signed)
Pt. Arrived with report of flu-like symptoms for 3 days.  Pt. Reports fever, non-productive cough, chest wall pain with cough.  Pt. Received Tylenol 800 mg en route. Fever was 102.4  AT present time, temp is 99.5 Pt. Is alert and oriented x4.  Skin is warm and dry

## 2016-10-26 NOTE — ED Provider Notes (Signed)
[facility] DEPT Provider Note   CSN: [id number] Arrival date & time: 10/26/16  1105     History   Chief Complaint Chief Complaint  Patient presents with  . Cough  . Shortness of Breath  . Influenza    HPI Timothy [name] is a 56 y.o. male.  HPI Patient reports he is very short of breath. He states he's had cough is productive of sputum. At first he states it was kind of cloudy looking but now more clear. Shortness of breath and cough worsened significantly overnight. He reports he has body aches all over. Also nasal congestion and sore throat. Patient's fever was up to 102.4 at time of medic transport. He was given 800 mg of ibuprofen on route. Patient does do dialysis Monday Wednesday Friday. He reports he had dialysis yesterday. Past Medical History:  Diagnosis Date  . Acute diastolic heart failure ([place]) 07/17/2016  . BPH (benign prostatic hyperplasia)   . Colon cancer ([place]) 2014  . Elevated troponin   . Hypertension     Patient Active Problem List   Diagnosis Date Noted  . Malnutrition of moderate degree 09/20/2016  . AKI (acute kidney injury) ([place])   . History of colon cancer   . Hyponatremia   . Leukocytosis   . Acute blood loss anemia   . Acute metabolic encephalopathy 78/93/8101  . Acute renal failure superimposed on stage 5 chronic kidney disease, not on chronic dialysis ([place]) 09/09/2016  . Hypertensive emergency 09/09/2016  . Acute respiratory failure with hypoxia ([place]) 09/09/2016  . Acute pulmonary edema ([place]) 09/09/2016  . Polysubstance abuse 09/09/2016  . Nonadherence to medical treatment 09/09/2016  . Anemia due to chronic kidney disease 09/09/2016  . Altered mental status 09/09/2016  . Acute respiratory failure ([place])   . Drug ingestion   . Hypertensive heart disease 07/19/2016  . Chest pain   . Elevated troponin 07/18/2016  . Acute diastolic heart failure, NYHA class 2 ([place]) 07/17/2016  . Heroin abuse 07/17/2016  . HTN (hypertension) 07/17/2016    . Benign prostate hyperplasia 07/16/2015    Past Surgical History:  Procedure Laterality Date  . ABDOMINAL SURGERY    . AV FISTULA PLACEMENT Left 09/19/2016   Procedure: Left arm Radiocephalic ARTERIOVENOUS (AV) FISTULA CREATION;  Surgeon: Conrad McDonough, MD;  Location: [facility];  Service: Vascular;  Laterality: Left;  . COLON SURGERY  2014  . INSERTION OF DIALYSIS CATHETER N/A 09/19/2016   Procedure: INSERTION OF TUNNELED DIALYSIS CATHETER;  Surgeon: Conrad [place], MD;  Location: [facility];  Service: Vascular;  Laterality: N/A;  . IR GENERIC HISTORICAL  09/14/2016   IR US GUIDE VASC ACCESS RIGHT 09/14/2016 [name], DO [facility]  . IR GENERIC HISTORICAL  09/14/2016   IR FLUORO GUIDE CV LINE RIGHT 09/14/2016 [name], DO [facility]       Home Medications    Prior to Admission medications   Medication Sig Start Date End Date Taking? Authorizing Provider  acetaminophen (TYLENOL) 500 MG tablet Take 1,500 mg by mouth every 6 (six) hours as needed for headache (pain).    Historical Provider, MD  acetaminophen (TYLENOL) 500 MG tablet Take 2 tablets (1,000 mg total) by mouth every 6 (six) hours as needed. 10/26/16   Charlesetta Shanks, MD  amLODipine (NORVASC) 10 MG tablet Take 1 tablet (10 mg total) by mouth daily. 07/22/16   Silver Huguenin Elgergawy, MD  calcitRIOL (ROCALTROL) 0.5 MCG capsule Take 1 capsule (0.5 mcg total) by mouth every other day.  09/23/16   Lavina Hamman, MD  calcium acetate (PHOSLO) 667 MG capsule Take 2 capsules (1,334 mg total) by mouth 3 (three) times daily with meals. 09/21/16   Lavina Hamman, MD  carvedilol (COREG) 25 MG tablet Take 1 tablet (25 mg total) by mouth 2 (two) times daily with a meal. 09/21/16   Lavina Hamman, MD  cyclobenzaprine (FLEXERIL) 10 MG tablet Take 1 tablet (10 mg total) by mouth 3 (three) times daily as needed for muscle spasms. Patient not taking: Reported on 09/10/2016 07/13/15   Melony Overly, MD  folic acid (FOLVITE) 1 MG tablet Take 1 tablet (1  mg total) by mouth daily. Patient not taking: Reported on 09/10/2016 07/23/16   Silver Huguenin Elgergawy, MD  guaiFENesin (MUCINEX) 600 MG 12 hr tablet Take 1 tablet (600 mg total) by mouth 2 (two) times daily. 09/21/16   Lavina Hamman, MD  HYDROcodone-acetaminophen (NORCO/VICODIN) 5-325 MG tablet Take 1 tablet by mouth every 4 (four) hours as needed for severe pain. Patient not taking: Reported on 09/10/2016 07/22/16   Silver Huguenin Elgergawy, MD  HYDROcodone-homatropine [facility]) 5-1.5 MG/5ML syrup Take 5 mLs by mouth every 6 (six) hours as needed for cough. 10/26/16   Charlesetta Shanks, MD  isosorbide-hydrALAZINE (BIDIL) 20-37.5 MG tablet Take 1 tablet by mouth 2 (two) times daily. Patient not taking: Reported on 09/10/2016 07/22/16   Silver Huguenin Elgergawy, MD  Multiple Vitamin (MULTIVITAMIN WITH MINERALS) TABS tablet Take 1 tablet by mouth daily. Patient taking differently: Take 1 tablet by mouth daily. Men's One a Day 07/23/16   Albertine Patricia, MD  multivitamin (RENA-VIT) TABS tablet Take 1 tablet by mouth at bedtime. 09/21/16   Lavina Hamman, MD  Nutritional Supplements (FEEDING SUPPLEMENT, NEPRO CARB STEADY,) LIQD Take 237 mLs by mouth 3 (three) times daily between meals. 09/21/16   Lavina Hamman, MD  oseltamivir (TAMIFLU) 30 MG capsule 30mg  tablet after dialysis for 5 doses 10/26/16   Charlesetta Shanks, MD  polyethylene glycol (MIRALAX / GLYCOLAX) packet Take 17 g by mouth daily. 09/22/16   Lavina Hamman, MD  pravastatin (PRAVACHOL) 40 MG tablet Take 1 tablet (40 mg total) by mouth daily. Patient not taking: Reported on 09/10/2016 09/13/15   Micheline Chapman, NP  senna-docusate (SENOKOT-S) 8.6-50 MG tablet Take 1 tablet by mouth at bedtime as needed for mild constipation. 09/21/16   Lavina Hamman, MD  tamsulosin (FLOMAX) 0.4 MG CAPS capsule Take 1 capsule (0.4 mg total) by mouth daily. Patient not taking: Reported on 09/10/2016 09/13/15   Micheline Chapman, NP  thiamine 100 MG tablet Take 1 tablet (100 mg  total) by mouth daily. Patient not taking: Reported on 09/10/2016 07/23/16   Silver Huguenin Elgergawy, MD  traZODone (DESYREL) 50 MG tablet Take 1 tablet (50 mg total) by mouth at bedtime. 09/21/16   Lavina Hamman, MD  zolpidem (AMBIEN) 5 MG tablet Take 1 tablet (5 mg total) by mouth at bedtime as needed for sleep. 09/21/16   Lavina Hamman, MD    Family History Family History  Problem Relation Age of Onset  . Heart disease Mother     Died at age 80.  Marland Kitchen Heart failure Mother   . Kidney failure Sister     Social History Social History  Substance Use Topics  . Smoking status: Current Every Day Smoker    Packs/day: 0.25    Types: Cigarettes  . Smokeless tobacco: Never Used  . Alcohol use 4.2 oz/week  7 Cans of beer per week     Comment: drinks daily 1-2 beers     Allergies   No known allergies   Review of Systems Review of Systems 10 Systems reviewed and are negative for acute change except as noted in the HPI.   Physical Exam Updated Vital Signs BP (!) 160/103   Pulse 81   Resp 13   Wt 146 lb (66.2 kg)   SpO2 93%   BMI 19.26 kg/m   Physical Exam  Constitutional: He is oriented to person, place, and time.  Patient is mildly ill in appearance. He appears uncomfortable. He is nontoxic. Slight tachypnea but speaking in full sentences.  HENT:  Head: Normocephalic and atraumatic.  Mouth/Throat: Oropharynx is clear and moist.  Eyes: EOM are normal.  Cardiovascular:  Tachycardia. No gross rub murmur gallop. Distant heart sounds.  Pulmonary/Chest:  Patient has intermittent paroxysmal cough. Expiratory wheeze to anterior auscultation and on the left. Soft breath sounds to posterior auscultation.   Abdominal: Soft. He exhibits no distension. There is no tenderness.  Musculoskeletal: Normal range of motion. He exhibits no edema or tenderness.  Neurological: He is alert and oriented to person, place, and time. He exhibits normal muscle tone. Coordination normal.  Skin: Skin is  warm and dry.  Psychiatric: He has a normal mood and affect.     ED Treatments / Results  Labs (all labs ordered are listed, but only abnormal results are displayed) Labs Reviewed  COMPREHENSIVE METABOLIC PANEL - Abnormal; Notable for the following:       Result Value   Chloride 99 (*)    Glucose, Bld 104 (*)    BUN 32 (*)    Creatinine, Ser 4.91 (*)    GFR calc non Af Amer 12 (*)    GFR calc Af Amer 14 (*)    All other components within normal limits  CBC WITH DIFFERENTIAL/PLATELET - Abnormal; Notable for the following:    WBC 13.8 (*)    Neutro Abs 11.7 (*)    All other components within normal limits  INFLUENZA PANEL BY PCR (TYPE A & B, H1N1) - Abnormal; Notable for the following:    Influenza A By PCR POSITIVE (*)    All other components within normal limits  CULTURE, BLOOD (ROUTINE X 2)  CULTURE, BLOOD (ROUTINE X 2)  I-STAT CG4 LACTIC ACID, ED  I-STAT CG4 LACTIC ACID, ED    EKG  EKG Interpretation  Date/Time:  Thursday October 26 2016 11:14:12 EST Ventricular Rate:  94 PR Interval:    QRS Duration: 90 QT Interval:  367 QTC Calculation: 459 R Axis:   52 Text Interpretation:  Sinus rhythm Anteroseptal infarct, old Nonspecific repol abnormality, lateral leads no sig. change from previos Confirmed by Johnney Killian, MD, [place] [phone]) on 10/26/2016 11:34:43 AM       Radiology Dg Chest 2 View  Result Date: 10/26/2016 CLINICAL DATA:  Cough and fever for 3 days EXAM: CHEST  2 VIEW COMPARISON:  09/19/2016 FINDINGS: Mild cardiomegaly. Normal vascularity. Bronchitic changes are chronic. Lungs are under aerated and clear. No pneumothorax or pleural effusion. Right jugular dialysis catheter tip is at the cavoatrial junction. This is stable. IMPRESSION: Cardiomegaly without decompensation. Electronically Signed   By: Marybelle Killings M.D.   On: 10/26/2016 11:45    Procedures Procedures (including critical care time)  Medications Ordered in ED Medications  oseltamivir (TAMIFLU)  capsule 30 mg (not administered)  ipratropium-albuterol (DUONEB) 0.5-2.5 (3) MG/3ML nebulizer solution 3 mL (3  mLs Nebulization Given 10/26/16 1204)  HYDROcodone-acetaminophen (NORCO/VICODIN) 5-325 MG per tablet 1 tablet (1 tablet Oral Given 10/26/16 1331)  ipratropium-albuterol (DUONEB) 0.5-2.5 (3) MG/3ML nebulizer solution 3 mL (3 mLs Nebulization Given 10/26/16 1331)     Initial Impression / Assessment and Plan / ED Course  I have reviewed the triage vital signs and the nursing notes.  Pertinent labs & imaging results that were available during my care of the patient were reviewed by me and considered in my medical decision making (see chart for details).  Clinical Course   12:10 patient has adamantly refused placement of an IV. He has allowed all testing and lab draws but would not allow peripheral IV site. Patient reports that he doesn't plan to stay in the hospital. I advised with his history of dialysis, fever or shortness of breath he may very well need to be admitted. The patient applied "I doubt it"  Patient reports that he did take his blood pressure medication as prescribed this morning. He reports he did not vomit it back up.  15:00 Patient is clinically well appearance. He is alert and nontoxic. Speech is clear with no respiratory distress.   Final Clinical Impressions(s) / ED Diagnoses   Final diagnoses:  Influenza A  ESRD (end stage renal disease) ([place])   Patient presents with generalized body aches, cough and shortness of breath. Symptoms are consistent with influenza and patient test is positive. After treatment with antipyretics and DuoNeb the patient is clinically stable. He already had yesterday's dialysis and does not show any signs of volume overload. Patient be initiated on Tamiflu. He is counseled on the necessity to return immediately should his symptoms worsen or change given his comorbid risk with ESRD. New Prescriptions New Prescriptions   ACETAMINOPHEN (TYLENOL)  500 MG TABLET    Take 2 tablets (1,000 mg total) by mouth every 6 (six) hours as needed.   HYDROCODONE-HOMATROPINE (HYCODAN) 5-1.5 MG/5ML SYRUP    Take 5 mLs by mouth every 6 (six) hours as needed for cough.   OSELTAMIVIR (TAMIFLU) 30 MG CAPSULE    30mg  tablet after dialysis for 5 doses     Charlesetta Shanks, MD 10/26/16 1507

## 2016-10-26 NOTE — ED Notes (Signed)
Pt. Just returned from X-ray

## 2016-10-26 NOTE — ED Notes (Signed)
Pt.  Has refused an IV

## 2016-10-26 NOTE — ED Notes (Signed)
Pt. Transferred to X-ray 

## 2016-10-26 NOTE — ED Notes (Signed)
Pt. Refused Iv explained to him the need for it.    Pt. Stated, "THey hurt, I do not want one."  Reported to Dr. Johnney Killian that pt. Has refused the IV.

## 2016-10-31 LAB — CULTURE, BLOOD (ROUTINE X 2)
Culture: NO GROWTH
Culture: NO GROWTH

## 2016-11-02 ENCOUNTER — Encounter: Payer: Self-pay | Admitting: Vascular Surgery

## 2016-11-03 ENCOUNTER — Encounter: Payer: No Typology Code available for payment source | Admitting: Vascular Surgery

## 2016-11-06 NOTE — Progress Notes (Signed)
    Postoperative Access Visit   History of Present Illness  Jaggar Benko is a 56 y.o. year old male who presents for postoperative follow-up for: RIJV TDC exchange, L RC AVF w/ SBL (Date: 09/19/16).  The patient's wounds are healed.  The patient notes no steal symptoms.  The patient is able to complete their activities of daily living.  The patient's current symptoms are: sx suggestive of cubital tunnel syndrome.  For VQI Use Only  PRE-ADM LIVING: Home  AMB STATUS: Ambulatory  Physical Examination Vitals:   11/08/16 0829 11/08/16 0831  BP: (!) 178/115 (!) 166/106  Pulse: 68   Resp: 20   Temp: 97 F (36.1 C)     LUE: Incision is healed, skin feels warm, hand grip is 5/5, sensation in digits is intact, palpable thrill, bruit can  be auscultated, on Sonosite: fistula >6 mm throughout, palpable radial pulse  Medical Decision Making  Khayri Kargbo is a 56 y.o. year old male who presents s/p RIJV TDC exchange, L RC AVF w/ SBL, possible cubital tunnel syndrome   The patient's access is ready for use.  The patient's tunneled dialysis catheter can be removed after two successful cannulations and completed dialysis treatments.  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  I gave the patient some conservative maneuvers to manage such.  He may require referral to Hand Surgery if this gets worse.  Thank you for allowing [place] to participate in this patient's care.  Adele Barthel, MD, FACS [facility] Office: [phone] Pager: [phone]

## 2016-11-08 ENCOUNTER — Ambulatory Visit (INDEPENDENT_AMBULATORY_CARE_PROVIDER_SITE_OTHER): Payer: Self-pay | Admitting: Vascular Surgery

## 2016-11-08 ENCOUNTER — Encounter: Payer: Self-pay | Admitting: Vascular Surgery

## 2016-11-08 VITALS — BP 166/106 | HR 68 | Temp 97.0°F | Resp 20 | Ht 73.0 in | Wt 146.0 lb

## 2016-11-08 DIAGNOSIS — Z992 Dependence on renal dialysis: Secondary | ICD-10-CM

## 2016-11-08 DIAGNOSIS — N186 End stage renal disease: Secondary | ICD-10-CM

## 2016-11-14 MED FILL — AMLODIPINE BESYLATE 10 MG T: 10 | 30 days supply | Qty: 30 | Fill #0

## 2016-11-14 MED FILL — CARVEDILOL 25 MG TABLET: 25 | 30 days supply | Qty: 60 | Fill #0

## 2016-11-20 DIAGNOSIS — R519 Headache, unspecified: Secondary | ICD-10-CM | POA: Insufficient documentation

## 2016-11-21 ENCOUNTER — Ambulatory Visit: Payer: Medicaid Other

## 2016-11-23 ENCOUNTER — Ambulatory Visit (INDEPENDENT_AMBULATORY_CARE_PROVIDER_SITE_OTHER): Payer: Self-pay | Admitting: Vascular Surgery

## 2016-11-23 VITALS — BP 155/105 | HR 80 | Temp 97.8°F | Resp 20 | Ht 73.0 in | Wt 160.0 lb

## 2016-11-23 DIAGNOSIS — Z992 Dependence on renal dialysis: Secondary | ICD-10-CM

## 2016-11-23 DIAGNOSIS — N186 End stage renal disease: Secondary | ICD-10-CM

## 2016-11-23 NOTE — Progress Notes (Signed)
Patient name: Timothy [name] MRN: [id number] DOB: 10-09-1961 Sex: male  REASON FOR VISIT: Difficult cannulation left arm fistula  HPI: Timothy [name] is a 56 y.o. male who previously underwent left radiocephalic AV fistula creation and side branch ligation on 09/19/2016 by Dr. Bridgett Larsson. He also had a right IJ tunneled dialysis catheter placed at that time. He was last seen in the office on 11/08/2016. At that time, his fistula was greater than 6 mm throughout and Dr. Bridgett Larsson felt it was ready to use. His and stated that the first time he was stopped, the needle went "through the vein." He says he knows when the needle infiltrates due to prior history of heroin use. The vein was infiltrated again on a second needle stick.   He is currently dialyzing via a right IJ tunneled dialysis catheter on Mondays, Wednesdays and Fridays.  Current Outpatient Prescriptions  Medication Sig Dispense Refill  . acetaminophen (TYLENOL) 500 MG tablet Take 1,500 mg by mouth every 6 (six) hours as needed for headache (pain).    Marland Kitchen amLODipine (NORVASC) 10 MG tablet Take 1 tablet (10 mg total) by mouth daily. 3 tablet 30  . calcitRIOL (ROCALTROL) 0.5 MCG capsule Take 1 capsule (0.5 mcg total) by mouth every other day. 30 capsule 0  . calcium acetate (PHOSLO) 667 MG capsule Take 2 capsules (1,334 mg total) by mouth 3 (three) times daily with meals. 60 capsule 0  . carvedilol (COREG) 25 MG tablet Take 1 tablet (25 mg total) by mouth 2 (two) times daily with a meal. 60 tablet 0  . cyclobenzaprine (FLEXERIL) 10 MG tablet Take 1 tablet (10 mg total) by mouth 3 (three) times daily as needed for muscle spasms. (Patient not taking: Reported on 11/08/2016) 30 tablet 0  . folic acid (FOLVITE) 1 MG tablet Take 1 tablet (1 mg total) by mouth daily. (Patient not taking: Reported on 09/10/2016) 30 tablet 3  . gabapentin (NEURONTIN) 300 MG capsule Take 300 mg by mouth at bedtime.    Marland Kitchen guaiFENesin (MUCINEX) 600 MG 12 hr tablet Take 1 tablet  (600 mg total) by mouth 2 (two) times daily. (Patient not taking: Reported on 11/08/2016) 20 tablet 0  . HYDROcodone-acetaminophen (NORCO/VICODIN) 5-325 MG tablet Take 1 tablet by mouth every 4 (four) hours as needed for severe pain. (Patient not taking: Reported on 09/10/2016) 10 tablet 0  . HYDROcodone-homatropine (HYCODAN) 5-1.5 MG/5ML syrup Take 5 mLs by mouth every 6 (six) hours as needed for cough. (Patient not taking: Reported on 11/08/2016) 120 mL 0  . hydrOXYzine (ATARAX/VISTARIL) 25 MG tablet Take 25 mg by mouth at bedtime.    . isosorbide-hydrALAZINE (BIDIL) 20-37.5 MG tablet Take 1 tablet by mouth 2 (two) times daily. (Patient not taking: Reported on 09/10/2016) 30 tablet 0  . Multiple Vitamin (MULTIVITAMIN WITH MINERALS) TABS tablet Take 1 tablet by mouth daily. (Patient not taking: Reported on 11/08/2016) 30 tablet 3  . multivitamin (RENA-VIT) TABS tablet Take 1 tablet by mouth at bedtime. (Patient not taking: Reported on 11/08/2016) 3 tablet 0  . Nutritional Supplements (FEEDING SUPPLEMENT, NEPRO CARB STEADY,) LIQD Take 237 mLs by mouth 3 (three) times daily between meals. (Patient not taking: Reported on 11/08/2016) 21 Can 0  . oseltamivir (TAMIFLU) 30 MG capsule 30mg  tablet after dialysis for 5 doses (Patient not taking: Reported on 11/08/2016) 5 capsule 0  . polyethylene glycol (MIRALAX / GLYCOLAX) packet Take 17 g by mouth daily. (Patient not taking: Reported on 11/08/2016) 14 each 0  . pravastatin (  PRAVACHOL) 40 MG tablet Take 1 tablet (40 mg total) by mouth daily. (Patient not taking: Reported on 09/10/2016) 90 tablet 3  . senna-docusate (SENOKOT-S) 8.6-50 MG tablet Take 1 tablet by mouth at bedtime as needed for mild constipation. (Patient not taking: Reported on 11/08/2016) 14 tablet 0  . tamsulosin (FLOMAX) 0.4 MG CAPS capsule Take 1 capsule (0.4 mg total) by mouth daily. (Patient not taking: Reported on 09/10/2016) 30 capsule 3  . thiamine 100 MG tablet Take 1 tablet (100 mg total) by  mouth daily. (Patient not taking: Reported on 09/10/2016) 30 tablet 3  . traZODone (DESYREL) 50 MG tablet Take 1 tablet (50 mg total) by mouth at bedtime. (Patient not taking: Reported on 11/08/2016) 30 tablet 0  . zolpidem (AMBIEN) 5 MG tablet Take 1 tablet (5 mg total) by mouth at bedtime as needed for sleep. (Patient not taking: Reported on 11/08/2016) 5 tablet 0   No current facility-administered medications for this visit.     REVIEW OF SYSTEMS:  [X]  denotes positive finding, [ ]  denotes negative finding Cardiac  Comments:  Chest pain or chest pressure:    Shortness of breath upon exertion:    Short of breath when lying flat:    Irregular heart rhythm:    Constitutional    Fever or chills:      PHYSICAL EXAM: Vitals:   11/23/16 1403 11/23/16 1407  BP: (!) 165/109 (!) 155/105  Pulse: 80   Resp: 20   Temp: 97.8 F (36.6 C)   TempSrc: Oral   SpO2: 99%   Weight: 160 lb (72.6 kg)   Height: 6\' 1"  (1.854 m)     GENERAL: The patient is a well-nourished male, in no acute distress. The vital signs are documented above. VASCULAR: Left forearm fistula is readily visible. Palpable thrill throughout. No pulsatility. Left hand with palpable radial pulse.   MEDICAL ISSUES: ESRD on HD  The patient is status post left radiocephalic AV fistula creation on 09/19/2016. The patient describes having difficulty with cannulation. He has been stuck twice and his fistula was infiltrated. On physical exam, the fistula is clearly visible with a an easily palpable thrill. This fistula is at least 6 mm throughout and should be able to be cannulated. Advised the patient to have the most experienced dialysis nurse cannulate his fistula.  If he continues to have issues, may consider a fistulogram.  Virgina Jock, PA-C [facility] MD: n/a

## 2016-11-28 DIAGNOSIS — L299 Pruritus, unspecified: Secondary | ICD-10-CM | POA: Insufficient documentation

## 2016-11-29 DIAGNOSIS — Z0279 Encounter for issue of other medical certificate: Secondary | ICD-10-CM | POA: Diagnosis not present

## 2017-07-04 ENCOUNTER — Encounter: Payer: Self-pay | Admitting: Vascular Surgery

## 2017-07-05 ENCOUNTER — Other Ambulatory Visit: Payer: Self-pay

## 2017-07-05 DIAGNOSIS — N186 End stage renal disease: Secondary | ICD-10-CM

## 2017-07-05 DIAGNOSIS — T82510A Breakdown (mechanical) of surgically created arteriovenous fistula, initial encounter: Secondary | ICD-10-CM

## 2017-07-05 DIAGNOSIS — Z01810 Encounter for preprocedural cardiovascular examination: Secondary | ICD-10-CM

## 2017-07-06 ENCOUNTER — Encounter (HOSPITAL_COMMUNITY): Payer: Self-pay | Admitting: Orthopedic Surgery

## 2017-07-06 ENCOUNTER — Other Ambulatory Visit: Payer: Self-pay | Admitting: *Deleted

## 2017-07-06 ENCOUNTER — Encounter: Payer: Self-pay | Admitting: *Deleted

## 2017-07-06 ENCOUNTER — Ambulatory Visit (HOSPITAL_COMMUNITY)
Admission: RE | Admit: 2017-07-06 | Discharge: 2017-07-06 | Disposition: A | Discharge status: Home / Self Care | Payer: Medicare Other | Source: Ambulatory Visit | Attending: Vascular Surgery | Admitting: Vascular Surgery

## 2017-07-06 ENCOUNTER — Encounter: Payer: Self-pay | Admitting: Vascular Surgery

## 2017-07-06 ENCOUNTER — Ambulatory Visit (INDEPENDENT_AMBULATORY_CARE_PROVIDER_SITE_OTHER)
Admission: RE | Admit: 2017-07-06 | Discharge: 2017-07-06 | Disposition: A | Discharge status: Home / Self Care | Payer: Medicare Other | Source: Ambulatory Visit | Attending: Vascular Surgery | Admitting: Vascular Surgery

## 2017-07-06 ENCOUNTER — Ambulatory Visit (INDEPENDENT_AMBULATORY_CARE_PROVIDER_SITE_OTHER): Payer: Medicare Other | Admitting: Vascular Surgery

## 2017-07-06 VITALS — BP 140/96 | HR 92 | Resp 20 | Ht 73.0 in | Wt 154.0 lb

## 2017-07-06 DIAGNOSIS — N186 End stage renal disease: Secondary | ICD-10-CM | POA: Insufficient documentation

## 2017-07-06 DIAGNOSIS — Z01810 Encounter for preprocedural cardiovascular examination: Secondary | ICD-10-CM | POA: Insufficient documentation

## 2017-07-06 DIAGNOSIS — Z992 Dependence on renal dialysis: Secondary | ICD-10-CM

## 2017-07-06 DIAGNOSIS — T82510A Breakdown (mechanical) of surgically created arteriovenous fistula, initial encounter: Secondary | ICD-10-CM | POA: Diagnosis not present

## 2017-07-06 DIAGNOSIS — Y838 Other surgical procedures as the cause of abnormal reaction of the patient, or of later complication, without mention of misadventure at the time of the procedure: Secondary | ICD-10-CM | POA: Insufficient documentation

## 2017-07-06 NOTE — Progress Notes (Signed)
Pt denies SOB, chest pain or fevers.  Pt denies being followed by a cardiologist  Pt is aware of arrival time and where to go to check in, meds to take day of surgery (Tylenol if needed, Norvasc and Cardizem).

## 2017-07-06 NOTE — Progress Notes (Signed)
.    Established Dialysis Access   History of Present Illness   Tabitha Tupper is a 56 y.o. (10-30-1960) male who presents for re-evaluation for permanent access.  The patient is right hand dominant.  Previous access procedures have been completed in the left arm.  The patient's complication from previous access procedures include: thrombosis.  Prior L RC AVF has thrombosed.   The patient has never had a previous PPM placed.  Past Medical History:  Diagnosis Date  . Acute diastolic heart failure ([place]) 07/17/2016  . BPH (benign prostatic hyperplasia)   . Colon cancer ([place]) 2014  . Elevated troponin   . Hypertension     Past Surgical History:  Procedure Laterality Date  . ABDOMINAL SURGERY    . AV FISTULA PLACEMENT Left 09/19/2016   Procedure: Left arm Radiocephalic ARTERIOVENOUS (AV) FISTULA CREATION;  Surgeon: Conrad Conception Junction, MD;  Location: [facility];  Service: Vascular;  Laterality: Left;  . COLON SURGERY  2014  . INSERTION OF DIALYSIS CATHETER N/A 09/19/2016   Procedure: INSERTION OF TUNNELED DIALYSIS CATHETER;  Surgeon: Conrad Hoke, MD;  Location: [facility];  Service: Vascular;  Laterality: N/A;  . IR GENERIC HISTORICAL  09/14/2016   IR US GUIDE VASC ACCESS RIGHT 09/14/2016 [name], DO [facility]  . IR GENERIC HISTORICAL  09/14/2016   IR FLUORO GUIDE CV LINE RIGHT 09/14/2016 [name], DO [facility]    Social History   Social History  . Marital status: Married    Spouse name: N/A  . Number of children: N/A  . Years of education: N/A   Occupational History  . Not on file.   Social History Main Topics  . Smoking status: Current Every Day Smoker    Packs/day: 0.25    Types: Cigarettes  . Smokeless tobacco: Never Used  . Alcohol use 4.2 oz/week    7 Cans of beer per week     Comment: drinks daily 1-2 beers  . Drug use: Yes    Frequency: 1.0 time per week    Types: Marijuana, Heroin     Comment: snorts heroin, takes pain pills that he buys off the street  .  Sexual activity: Not on file     Comment: once a week   Other Topics Concern  . Not on file   Social History Narrative  . No narrative on file    Family History  Problem Relation Age of Onset  . Heart disease Mother        Died at age 18.  Marland Kitchen Heart failure Mother   . Kidney failure Sister     Current Outpatient Prescriptions  Medication Sig Dispense Refill  . amLODipine (NORVASC) 10 MG tablet Take 1 tablet (10 mg total) by mouth daily. 3 tablet 30  . calcium acetate (PHOSLO) 667 MG capsule Take 2 capsules (1,334 mg total) by mouth 3 (three) times daily with meals. 60 capsule 0  . carvedilol (COREG) 25 MG tablet Take 1 tablet (25 mg total) by mouth 2 (two) times daily with a meal. 60 tablet 0  . acetaminophen (TYLENOL) 500 MG tablet Take 1,500 mg by mouth every 6 (six) hours as needed for headache (pain).    . calcitRIOL (ROCALTROL) 0.5 MCG capsule Take 1 capsule (0.5 mcg total) by mouth every other day. (Patient not taking: Reported on 07/06/2017) 30 capsule 0  . cyclobenzaprine (FLEXERIL) 10 MG tablet Take 1 tablet (10 mg total) by mouth 3 (three) times daily as needed for  muscle spasms. (Patient not taking: Reported on 11/08/2016) 30 tablet 0  . folic acid (FOLVITE) 1 MG tablet Take 1 tablet (1 mg total) by mouth daily. (Patient not taking: Reported on 09/10/2016) 30 tablet 3  . gabapentin (NEURONTIN) 300 MG capsule Take 300 mg by mouth at bedtime.    Marland Kitchen guaiFENesin (MUCINEX) 600 MG 12 hr tablet Take 1 tablet (600 mg total) by mouth 2 (two) times daily. (Patient not taking: Reported on 11/08/2016) 20 tablet 0  . HYDROcodone-acetaminophen (NORCO/VICODIN) 5-325 MG tablet Take 1 tablet by mouth every 4 (four) hours as needed for severe pain. (Patient not taking: Reported on 09/10/2016) 10 tablet 0  . HYDROcodone-homatropine (HYCODAN) 5-1.5 MG/5ML syrup Take 5 mLs by mouth every 6 (six) hours as needed for cough. (Patient not taking: Reported on 11/08/2016) 120 mL 0  . hydrOXYzine  (ATARAX/VISTARIL) 25 MG tablet Take 25 mg by mouth at bedtime.    . isosorbide-hydrALAZINE (BIDIL) 20-37.5 MG tablet Take 1 tablet by mouth 2 (two) times daily. (Patient not taking: Reported on 09/10/2016) 30 tablet 0  . Multiple Vitamin (MULTIVITAMIN WITH MINERALS) TABS tablet Take 1 tablet by mouth daily. (Patient not taking: Reported on 11/08/2016) 30 tablet 3  . multivitamin (RENA-VIT) TABS tablet Take 1 tablet by mouth at bedtime. (Patient not taking: Reported on 11/08/2016) 3 tablet 0  . Nutritional Supplements (FEEDING SUPPLEMENT, NEPRO CARB STEADY,) LIQD Take 237 mLs by mouth 3 (three) times daily between meals. (Patient not taking: Reported on 11/08/2016) 21 Can 0  . oseltamivir (TAMIFLU) 30 MG capsule 30mg  tablet after dialysis for 5 doses (Patient not taking: Reported on 11/08/2016) 5 capsule 0  . polyethylene glycol (MIRALAX / GLYCOLAX) packet Take 17 g by mouth daily. (Patient not taking: Reported on 11/08/2016) 14 each 0  . pravastatin (PRAVACHOL) 40 MG tablet Take 1 tablet (40 mg total) by mouth daily. (Patient not taking: Reported on 09/10/2016) 90 tablet 3  . senna-docusate (SENOKOT-S) 8.6-50 MG tablet Take 1 tablet by mouth at bedtime as needed for mild constipation. (Patient not taking: Reported on 11/08/2016) 14 tablet 0  . tamsulosin (FLOMAX) 0.4 MG CAPS capsule Take 1 capsule (0.4 mg total) by mouth daily. (Patient not taking: Reported on 09/10/2016) 30 capsule 3  . thiamine 100 MG tablet Take 1 tablet (100 mg total) by mouth daily. (Patient not taking: Reported on 09/10/2016) 30 tablet 3  . traZODone (DESYREL) 50 MG tablet Take 1 tablet (50 mg total) by mouth at bedtime. (Patient not taking: Reported on 11/08/2016) 30 tablet 0  . zolpidem (AMBIEN) 5 MG tablet Take 1 tablet (5 mg total) by mouth at bedtime as needed for sleep. (Patient not taking: Reported on 11/08/2016) 5 tablet 0   No current facility-administered medications for this visit.      Allergies  Allergen Reactions  . No  Known Allergies     REVIEW OF SYSTEMS (negative unless checked):   Cardiac:  []  Chest pain or chest pressure? []  Shortness of breath upon activity? []  Shortness of breath when lying flat? []  Irregular heart rhythm?  Vascular:  []  Pain in calf, thigh, or hip brought on by walking? []  Pain in feet at night that wakes you up from your sleep? []  Blood clot in your veins? []  Leg swelling?  Pulmonary:  []  Oxygen at home? []  Productive cough? []  Wheezing?  Neurologic:  []  Sudden weakness in arms or legs? []  Sudden numbness in arms or legs? []  Sudden onset of difficult speaking or slurred speech? []   Temporary loss of vision in one eye? []  Problems with dizziness?  Gastrointestinal:  []  Blood in stool? []  Vomited blood?  Genitourinary:  []  Burning when urinating? []  Blood in urine? [x]  end stage renal disease-HD: T/R/S   Psychiatric:  []  Major depression  Hematologic:  []  Bleeding problems? []  Problems with blood clotting?  Dermatologic:  []  Rashes or ulcers?  Constitutional:  []  Fever or chills?  Ear/Nose/Throat:  []  Change in hearing? []  Nose bleeds? []  Sore throat?  Musculoskeletal:  []  Back pain? []  Joint pain? []  Muscle pain?   Physical Examination   Vitals:   07/06/17 1207  BP: (!) 140/96  Pulse: 92  Resp: 20  SpO2: 97%  Weight: 154 lb (69.9 kg)  Height: 6\' 1"  (1.854 m)   Body mass index is 20.32 kg/m.  General Alert, O x 3, WD, NAD  Pulmonary Sym exp, good B air movt, CTA B  Cardiac RRR, Nl S1, S2, no Murmurs, No rubs, No S3,S4  Vascular Vessel Right Left  Radial Palpable Palpable  Brachial Palpable Palpable  Ulnar Not palpable Not palpable    Musculo- skeletal M/S 5/5 throughout  , Extremities without ischemic changes, no thrill in L RC AVF, palpable cord where L RC AVF was previously    Neurologic Pain and light touch intact in extremities , Motor exam as listed above     Non-invasive Vascular Imaging   BUE Doppler (07/06/2017  ):   R arm:   Brachial: tri, 5.7 mm  Radial: tri, 3.1 mm  Ulnar: tri, 1.2 mm  L arm:   Brachial: tri, 5.7 mm  Radial: tri, 2.8 mm  Ulnar: tri, 1.8 mm  BUE Vein Mapping  (07/06/2017):   R arm: acceptable vein conduits include upper arm basilic  L arm: acceptable vein conduits include upper arm basilic   Medical Decision Making   Aaric Dolph is a 56 y.o. male who presents with ESRD requiring hemodialysis.    Based on vein mapping and examination, this patient's permanent access options include: L staged BVT vs R staged BVT.  I would first proceed with left single stage basilic vein transposition .  I had an extensive discussion with this patient in regards to the nature of access surgery, including risk, benefits, and alternatives.    The patient is aware that the risks of access surgery include but are not limited to: bleeding, infection, steal syndrome, nerve damage, ischemic monomelic neuropathy, failure of access to mature, and possible need for additional access procedures in the future.  The patient is aware that I construct transposition procedures in two stages, requiring a separate operation to complete the procedure.  The patient has agreed to proceed with the above procedure which will be scheduled 24 SEP 18.   Adele Barthel, MD, FACS [facility] Office: [phone] Pager: [phone]

## 2017-07-09 ENCOUNTER — Ambulatory Visit (HOSPITAL_COMMUNITY): Payer: Medicare Other | Admitting: Certified Registered Nurse Anesthetist

## 2017-07-09 ENCOUNTER — Encounter (HOSPITAL_COMMUNITY): Payer: Self-pay | Admitting: *Deleted

## 2017-07-09 ENCOUNTER — Ambulatory Visit (HOSPITAL_COMMUNITY)
Admission: RE | Admit: 2017-07-09 | Discharge: 2017-07-09 | Disposition: A | Discharge status: Home / Self Care | Payer: Medicare Other | Source: Ambulatory Visit | Attending: Vascular Surgery | Admitting: Vascular Surgery

## 2017-07-09 ENCOUNTER — Encounter (HOSPITAL_COMMUNITY)
Admission: RE | Disposition: A | Discharge status: Home / Self Care | Payer: Self-pay | Source: Ambulatory Visit | Attending: Vascular Surgery

## 2017-07-09 DIAGNOSIS — N186 End stage renal disease: Secondary | ICD-10-CM | POA: Diagnosis not present

## 2017-07-09 DIAGNOSIS — F17210 Nicotine dependence, cigarettes, uncomplicated: Secondary | ICD-10-CM | POA: Diagnosis not present

## 2017-07-09 DIAGNOSIS — I5031 Acute diastolic (congestive) heart failure: Secondary | ICD-10-CM | POA: Insufficient documentation

## 2017-07-09 DIAGNOSIS — Z992 Dependence on renal dialysis: Secondary | ICD-10-CM | POA: Insufficient documentation

## 2017-07-09 DIAGNOSIS — Z79899 Other long term (current) drug therapy: Secondary | ICD-10-CM | POA: Diagnosis not present

## 2017-07-09 DIAGNOSIS — I132 Hypertensive heart and chronic kidney disease with heart failure and with stage 5 chronic kidney disease, or end stage renal disease: Secondary | ICD-10-CM | POA: Diagnosis not present

## 2017-07-09 HISTORY — DX: Chronic kidney disease, unspecified: N18.9

## 2017-07-09 HISTORY — PX: BASCILIC VEIN TRANSPOSITION: SHX5742

## 2017-07-09 LAB — POCT I-STAT 4, (NA,K, GLUC, HGB,HCT)
GLUCOSE: 91 mg/dL (ref 65–99)
HEMATOCRIT: 31 % — AB (ref 39.0–52.0)
HEMOGLOBIN: 10.5 g/dL — AB (ref 13.0–17.0)
POTASSIUM: 4.5 mmol/L (ref 3.5–5.1)
SODIUM: 140 mmol/L (ref 135–145)

## 2017-07-09 SURGERY — TRANSPOSITION, VEIN, BASILIC
Anesthesia: General | Site: Arm Upper | Laterality: Left

## 2017-07-09 MED ORDER — LIDOCAINE 2% (20 MG/ML) 5 ML SYRINGE
INTRAMUSCULAR | Status: DC | PRN
  Administered 2017-07-09: 70 mg via INTRAVENOUS

## 2017-07-09 MED ORDER — LIDOCAINE 2% (20 MG/ML) 5 ML SYRINGE
INTRAMUSCULAR | Status: AC
  Filled 2017-07-09: qty 5

## 2017-07-09 MED ORDER — 0.9 % SODIUM CHLORIDE (POUR BTL) OPTIME
TOPICAL | Status: DC | PRN
  Administered 2017-07-09: 1000 mL

## 2017-07-09 MED ORDER — EPHEDRINE SULFATE 50 MG/ML IJ SOLN
INTRAMUSCULAR | Status: AC
  Filled 2017-07-09: qty 1

## 2017-07-09 MED ORDER — SODIUM CHLORIDE 0.9 % IV SOLN
INTRAVENOUS | Status: DC
  Administered 2017-07-09: 08:00:00 via INTRAVENOUS

## 2017-07-09 MED ORDER — MIDAZOLAM HCL 5 MG/5ML IJ SOLN
INTRAMUSCULAR | Status: DC | PRN
  Administered 2017-07-09 (×2): 1 mg via INTRAVENOUS

## 2017-07-09 MED ORDER — OXYCODONE-ACETAMINOPHEN 5-325 MG PO TABS: 1.0000 | ORAL_TABLET | Freq: Four times a day (QID) | ORAL | 0 refills | Status: DC | PRN

## 2017-07-09 MED ORDER — OXYCODONE-ACETAMINOPHEN 5-325 MG PO TABS
1.0000 | ORAL_TABLET | ORAL | Status: AC | PRN
  Administered 2017-07-09: 1 via ORAL

## 2017-07-09 MED ORDER — DEXTROSE 5 % IV SOLN
INTRAVENOUS | Status: AC
  Filled 2017-07-09: qty 1.5

## 2017-07-09 MED ORDER — ONDANSETRON HCL 4 MG/2ML IJ SOLN
INTRAMUSCULAR | Status: AC
  Filled 2017-07-09: qty 2

## 2017-07-09 MED ORDER — PROPOFOL 10 MG/ML IV BOLUS
INTRAVENOUS | Status: DC | PRN
  Administered 2017-07-09: 120 mg via INTRAVENOUS
  Administered 2017-07-09: 30 mg via INTRAVENOUS

## 2017-07-09 MED ORDER — LIDOCAINE HCL (PF) 1 % IJ SOLN
INTRAMUSCULAR | Status: AC
  Filled 2017-07-09: qty 30

## 2017-07-09 MED ORDER — SODIUM CHLORIDE 0.9 % IV SOLN
INTRAVENOUS | Status: DC | PRN
  Administered 2017-07-09: 10:00:00

## 2017-07-09 MED ORDER — PHENYLEPHRINE 40 MCG/ML (10ML) SYRINGE FOR IV PUSH (FOR BLOOD PRESSURE SUPPORT)
PREFILLED_SYRINGE | INTRAVENOUS | Status: AC
  Filled 2017-07-09: qty 10

## 2017-07-09 MED ORDER — CHLORHEXIDINE GLUCONATE 4 % EX LIQD: 60.0000 mL | Freq: Once | CUTANEOUS | Status: DC

## 2017-07-09 MED ORDER — DEXAMETHASONE SODIUM PHOSPHATE 10 MG/ML IJ SOLN
INTRAMUSCULAR | Status: DC | PRN
  Administered 2017-07-09: 5 mg via INTRAVENOUS

## 2017-07-09 MED ORDER — OXYCODONE-ACETAMINOPHEN 5-325 MG PO TABS
ORAL_TABLET | ORAL | Status: AC
  Filled 2017-07-09: qty 1

## 2017-07-09 MED ORDER — DEXTROSE 5 % IV SOLN
1.5000 g | INTRAVENOUS | Status: AC
  Administered 2017-07-09: 1.5 g via INTRAVENOUS

## 2017-07-09 MED ORDER — DEXAMETHASONE SODIUM PHOSPHATE 10 MG/ML IJ SOLN
INTRAMUSCULAR | Status: AC
  Filled 2017-07-09: qty 1

## 2017-07-09 MED ORDER — MEPERIDINE HCL 25 MG/ML IJ SOLN: 6.2500 mg | INTRAMUSCULAR | Status: DC | PRN

## 2017-07-09 MED ORDER — FENTANYL CITRATE (PF) 100 MCG/2ML IJ SOLN: 25.0000 ug | INTRAMUSCULAR | Status: DC | PRN

## 2017-07-09 MED ORDER — FENTANYL CITRATE (PF) 250 MCG/5ML IJ SOLN
INTRAMUSCULAR | Status: AC
  Filled 2017-07-09: qty 5

## 2017-07-09 MED ORDER — MIDAZOLAM HCL 2 MG/2ML IJ SOLN: 0.5000 mg | Freq: Once | INTRAMUSCULAR | Status: DC | PRN

## 2017-07-09 MED ORDER — PROMETHAZINE HCL 25 MG/ML IJ SOLN
6.2500 mg | INTRAMUSCULAR | Status: DC | PRN
Start: 2017-07-09 — End: 2017-07-09

## 2017-07-09 MED ORDER — MIDAZOLAM HCL 2 MG/2ML IJ SOLN
INTRAMUSCULAR | Status: AC
  Filled 2017-07-09: qty 2

## 2017-07-09 MED ORDER — CHLORHEXIDINE GLUCONATE 4 % EX LIQD
60.0000 mL | Freq: Once | CUTANEOUS | Status: DC
Start: 2017-07-10 — End: 2017-07-09

## 2017-07-09 MED ORDER — FENTANYL CITRATE (PF) 100 MCG/2ML IJ SOLN
INTRAMUSCULAR | Status: DC | PRN
  Administered 2017-07-09 (×8): 50 ug via INTRAVENOUS

## 2017-07-09 SURGICAL SUPPLY — 37 items
ARMBAND PINK RESTRICT EXTREMIT (MISCELLANEOUS) ×2 IMPLANT
CANISTER SUCT 3000ML PPV (MISCELLANEOUS) ×2 IMPLANT
CLIP VESOCCLUDE MED 24/CT (CLIP) IMPLANT
CLIP VESOCCLUDE MED LG 6/CT (CLIP) ×2 IMPLANT
CLIP VESOCCLUDE SM WIDE 24/CT (CLIP) IMPLANT
CLIP VESOCCLUDE SM WIDE 6/CT (CLIP) ×2 IMPLANT
CORDS BIPOLAR (ELECTRODE) IMPLANT
COVER PROBE W GEL 5X96 (DRAPES) ×2 IMPLANT
DECANTER SPIKE VIAL GLASS SM (MISCELLANEOUS) IMPLANT
DERMABOND ADVANCED (GAUZE/BANDAGES/DRESSINGS) ×1
DERMABOND ADVANCED .7 DNX12 (GAUZE/BANDAGES/DRESSINGS) ×1 IMPLANT
ELECT REM PT RETURN 9FT ADLT (ELECTROSURGICAL) ×2
ELECTRODE REM PT RTRN 9FT ADLT (ELECTROSURGICAL) ×1 IMPLANT
GLOVE BIO SURGEON STRL SZ 6.5 (GLOVE) ×2 IMPLANT
GLOVE BIO SURGEON STRL SZ7 (GLOVE) ×2 IMPLANT
GLOVE BIOGEL PI IND STRL 6.5 (GLOVE) ×3 IMPLANT
GLOVE BIOGEL PI IND STRL 7.5 (GLOVE) ×1 IMPLANT
GLOVE BIOGEL PI INDICATOR 6.5 (GLOVE) ×3
GLOVE BIOGEL PI INDICATOR 7.5 (GLOVE) ×1
GOWN STRL REUS W/ TWL LRG LVL3 (GOWN DISPOSABLE) ×3 IMPLANT
GOWN STRL REUS W/TWL LRG LVL3 (GOWN DISPOSABLE) ×3
HEMOSTAT SPONGE AVITENE ULTRA (HEMOSTASIS) IMPLANT
KIT BASIN OR (CUSTOM PROCEDURE TRAY) ×2 IMPLANT
KIT ROOM TURNOVER OR (KITS) ×2 IMPLANT
NS IRRIG 1000ML POUR BTL (IV SOLUTION) ×2 IMPLANT
PACK CV ACCESS (CUSTOM PROCEDURE TRAY) ×2 IMPLANT
PAD ARMBOARD 7.5X6 YLW CONV (MISCELLANEOUS) ×4 IMPLANT
SUT MNCRL AB 4-0 PS2 18 (SUTURE) ×2 IMPLANT
SUT PROLENE 6 0 BV (SUTURE) ×2 IMPLANT
SUT PROLENE 7 0 BV 1 (SUTURE) IMPLANT
SUT SILK 2 0 SH (SUTURE) IMPLANT
SUT VIC AB 2-0 CT1 27 (SUTURE)
SUT VIC AB 2-0 CT1 TAPERPNT 27 (SUTURE) IMPLANT
SUT VIC AB 3-0 SH 27 (SUTURE) ×1
SUT VIC AB 3-0 SH 27X BRD (SUTURE) ×1 IMPLANT
UNDERPAD 30X30 (UNDERPADS AND DIAPERS) ×2 IMPLANT
WATER STERILE IRR 1000ML POUR (IV SOLUTION) ×2 IMPLANT

## 2017-07-09 NOTE — Anesthesia Postprocedure Evaluation (Signed)
Anesthesia Post Note  Patient: Sheikh Leverich  Procedure(s) Performed: Procedure(s) (LRB): BRACHIOCEPHALIC FISTULA CREATION (Left)     Patient location during evaluation: PACU Anesthesia Type: General Level of consciousness: awake and alert, patient cooperative and oriented Pain management: pain level controlled Vital Signs Assessment: post-procedure vital signs reviewed and stable Respiratory status: spontaneous breathing, nonlabored ventilation and respiratory function stable Cardiovascular status: blood pressure returned to baseline and stable Postop Assessment: no apparent nausea or vomiting Anesthetic complications: no    Last Vitals:  Vitals:   07/09/17 1214 07/09/17 1215  BP:  (!) 169/97  Pulse: 71 74  Resp: 13 12  Temp:  36.7 C  SpO2: 94% 94%    Last Pain:  Vitals:   07/09/17 1220  TempSrc:   PainSc: 3                  Diogo Anne,E. Adryanna Friedt

## 2017-07-09 NOTE — Op Note (Signed)
OPERATIVE NOTE   PROCEDURE: left brachiocephalic arteriovenous fistula placement  PRE-OPERATIVE DIAGNOSIS: end stage renal disease   POST-OPERATIVE DIAGNOSIS: same as above   SURGEON: Adele Barthel, MD  ASSISTANT(S): RNFA  ANESTHESIA: IV sedation  ESTIMATED BLOOD LOSS: 30 cc  FINDING(S): 1.  Cephalic vein: 3-4 mm, initial resistance resolved with serial dilation, suggestive of sclerotic valves 2.  Brachial artery: 5 mm, disease free 3.  Venous outflow: palpable thrill  4.  Radial flow: palpable radial pulse  SPECIMEN(S):  none  INDICATIONS:   Timothy [name] is a 56 y.o. male who presents with end stage renal disease and thrombosed left radiocephalic arteriovenous fistula.  The patient is scheduled for left staged basilic vein transposition based on vein mapping.  The patient is aware the risks include but are not limited to: bleeding, infection, steal syndrome, nerve damage, ischemic monomelic neuropathy, failure to mature, and need for additional procedures.  The patient is aware of the risks of the procedure and elects to proceed forward.   DESCRIPTION: After full informed written consent was obtained from the patient, the patient was brought back to the operating room and placed supine upon the operating table.  Prior to induction, the patient received IV antibiotics.   After obtaining adequate anesthesia, the patient was then prepped and draped in the standard fashion for a left arm access procedure.  I turned my attention first to identifying the patient's basilic vein and brachial artery.  However, upon evaluating a cubital vein draining into the basilic vein, I found a deep cephalic vein branch that looked adequately size.  I followed this into the upper arm and I felt it might be adequate for fistula use.  I decided to explored both the cubital and deep cephalic vein branch in the proximal forearm and use the better vein for this fistula.  Using SonoSite guidance, the  location of these vessels were marked out on the skin.     I made a transverse incision at the level of proximal forearm and dissected through the subcutaneous tissue and fascia to gain exposure of the brachial artery.  In this process, I found the cubital vein and deep cephalic vein confluence.  I dissected each out and placed vessel loops.  The cubital vein looked to be only 2.5-3.0 mm.  The cephalic vein looked to be 3-4 mm in diameter.  I then completed the dissection on the brachial artery which was deep.  This artery was noted to be 5 mm in diameter externally.  This was dissected out proximally and distally and controlled with vessel loops.  I had to dissect off a large 4-5 mm brachial vein off the artery to get exposure of the artery.  I tied off the cephalic vein branch with a 2-0 silk just proximal to the confluence of the cubital and cephalic vein, to preserve the cubital vein.  I felt the cephalic vein close enough to the brachial vein.  The cephalic vein was transected and then the proximal segment was interrogated with serial dilators.  The vein accepted up to a 5.5 mm dilator without any difficulty.  Initially, there was resistance, but this resolved with serial dilation, suggesting the presence of sclerotic valves.  I then instilled the heparinized saline into the vein and clamped it.  At this point, I reset my exposure of the brachial artery and placed the artery under tension proximally and distally.  I made an arteriotomy with a #11 blade, and then I extended the arteriotomy  with a Engineer, drilling.  I injected heparinized saline proximal and distal to this arteriotomy.  The vein was then sewn to the artery in an end-to-side configuration with a running stitch of 6-0 Prolene.  Prior to completing this anastomosis, I allowed the vein and artery to backbleed.  There was no evidence of clot from any vessels.  I completed the anastomosis in the usual fashion and then released all vessel loops and  clamps.    There was a palpable thrill in the venous outflow, and there was a palpable radial pulse.  At this point, I irrigated out the surgical wound.  There was no further active bleeding.  The subcutaneous tissue was reapproximated with a running stitch of 3-0 Vicryl.  The skin was then reapproximated with a running subcuticular stitch of 4-0 Vicryl.  The skin was then cleaned, dried, and reinforced with Dermabond.  The patient tolerated this procedure well.    COMPLICATIONS: none  CONDITION: stable   Adele Barthel, MD, [facility] [place] Office: [phone] Pager: [phone]  07/09/2017, 11:04 AM

## 2017-07-09 NOTE — H&P (View-Only) (Signed)
.    Established Dialysis Access   History of Present Illness   Mansfield Dann is a 56 y.o. (07/12/1961) male who presents for re-evaluation for permanent access.  The patient is right hand dominant.  Previous access procedures have been completed in the left arm.  The patient's complication from previous access procedures include: thrombosis.  Prior L RC AVF has thrombosed.   The patient has never had a previous PPM placed.  Past Medical History:  Diagnosis Date  . Acute diastolic heart failure ([place]) 07/17/2016  . BPH (benign prostatic hyperplasia)   . Colon cancer ([place]) 2014  . Elevated troponin   . Hypertension     Past Surgical History:  Procedure Laterality Date  . ABDOMINAL SURGERY    . AV FISTULA PLACEMENT Left 09/19/2016   Procedure: Left arm Radiocephalic ARTERIOVENOUS (AV) FISTULA CREATION;  Surgeon: Conrad McCaskill, MD;  Location: [facility];  Service: Vascular;  Laterality: Left;  . COLON SURGERY  2014  . INSERTION OF DIALYSIS CATHETER N/A 09/19/2016   Procedure: INSERTION OF TUNNELED DIALYSIS CATHETER;  Surgeon: Conrad Clarkston, MD;  Location: [facility];  Service: Vascular;  Laterality: N/A;  . IR GENERIC HISTORICAL  09/14/2016   IR US GUIDE VASC ACCESS RIGHT 09/14/2016 [name], DO [facility]  . IR GENERIC HISTORICAL  09/14/2016   IR FLUORO GUIDE CV LINE RIGHT 09/14/2016 [name], DO [facility]    Social History   Social History  . Marital status: Married    Spouse name: N/A  . Number of children: N/A  . Years of education: N/A   Occupational History  . Not on file.   Social History Main Topics  . Smoking status: Current Every Day Smoker    Packs/day: 0.25    Types: Cigarettes  . Smokeless tobacco: Never Used  . Alcohol use 4.2 oz/week    7 Cans of beer per week     Comment: drinks daily 1-2 beers  . Drug use: Yes    Frequency: 1.0 time per week    Types: Marijuana, Heroin     Comment: snorts heroin, takes pain pills that he buys off the street  .  Sexual activity: Not on file     Comment: once a week   Other Topics Concern  . Not on file   Social History Narrative  . No narrative on file    Family History  Problem Relation Age of Onset  . Heart disease Mother        Died at age 54.  Marland Kitchen Heart failure Mother   . Kidney failure Sister     Current Outpatient Prescriptions  Medication Sig Dispense Refill  . amLODipine (NORVASC) 10 MG tablet Take 1 tablet (10 mg total) by mouth daily. 3 tablet 30  . calcium acetate (PHOSLO) 667 MG capsule Take 2 capsules (1,334 mg total) by mouth 3 (three) times daily with meals. 60 capsule 0  . carvedilol (COREG) 25 MG tablet Take 1 tablet (25 mg total) by mouth 2 (two) times daily with a meal. 60 tablet 0  . acetaminophen (TYLENOL) 500 MG tablet Take 1,500 mg by mouth every 6 (six) hours as needed for headache (pain).    . calcitRIOL (ROCALTROL) 0.5 MCG capsule Take 1 capsule (0.5 mcg total) by mouth every other day. (Patient not taking: Reported on 07/06/2017) 30 capsule 0  . cyclobenzaprine (FLEXERIL) 10 MG tablet Take 1 tablet (10 mg total) by mouth 3 (three) times daily as needed for  muscle spasms. (Patient not taking: Reported on 11/08/2016) 30 tablet 0  . folic acid (FOLVITE) 1 MG tablet Take 1 tablet (1 mg total) by mouth daily. (Patient not taking: Reported on 09/10/2016) 30 tablet 3  . gabapentin (NEURONTIN) 300 MG capsule Take 300 mg by mouth at bedtime.    Marland Kitchen guaiFENesin (MUCINEX) 600 MG 12 hr tablet Take 1 tablet (600 mg total) by mouth 2 (two) times daily. (Patient not taking: Reported on 11/08/2016) 20 tablet 0  . HYDROcodone-acetaminophen (NORCO/VICODIN) 5-325 MG tablet Take 1 tablet by mouth every 4 (four) hours as needed for severe pain. (Patient not taking: Reported on 09/10/2016) 10 tablet 0  . HYDROcodone-homatropine (HYCODAN) 5-1.5 MG/5ML syrup Take 5 mLs by mouth every 6 (six) hours as needed for cough. (Patient not taking: Reported on 11/08/2016) 120 mL 0  . hydrOXYzine  (ATARAX/VISTARIL) 25 MG tablet Take 25 mg by mouth at bedtime.    . isosorbide-hydrALAZINE (BIDIL) 20-37.5 MG tablet Take 1 tablet by mouth 2 (two) times daily. (Patient not taking: Reported on 09/10/2016) 30 tablet 0  . Multiple Vitamin (MULTIVITAMIN WITH MINERALS) TABS tablet Take 1 tablet by mouth daily. (Patient not taking: Reported on 11/08/2016) 30 tablet 3  . multivitamin (RENA-VIT) TABS tablet Take 1 tablet by mouth at bedtime. (Patient not taking: Reported on 11/08/2016) 3 tablet 0  . Nutritional Supplements (FEEDING SUPPLEMENT, NEPRO CARB STEADY,) LIQD Take 237 mLs by mouth 3 (three) times daily between meals. (Patient not taking: Reported on 11/08/2016) 21 Can 0  . oseltamivir (TAMIFLU) 30 MG capsule 30mg  tablet after dialysis for 5 doses (Patient not taking: Reported on 11/08/2016) 5 capsule 0  . polyethylene glycol (MIRALAX / GLYCOLAX) packet Take 17 g by mouth daily. (Patient not taking: Reported on 11/08/2016) 14 each 0  . pravastatin (PRAVACHOL) 40 MG tablet Take 1 tablet (40 mg total) by mouth daily. (Patient not taking: Reported on 09/10/2016) 90 tablet 3  . senna-docusate (SENOKOT-S) 8.6-50 MG tablet Take 1 tablet by mouth at bedtime as needed for mild constipation. (Patient not taking: Reported on 11/08/2016) 14 tablet 0  . tamsulosin (FLOMAX) 0.4 MG CAPS capsule Take 1 capsule (0.4 mg total) by mouth daily. (Patient not taking: Reported on 09/10/2016) 30 capsule 3  . thiamine 100 MG tablet Take 1 tablet (100 mg total) by mouth daily. (Patient not taking: Reported on 09/10/2016) 30 tablet 3  . traZODone (DESYREL) 50 MG tablet Take 1 tablet (50 mg total) by mouth at bedtime. (Patient not taking: Reported on 11/08/2016) 30 tablet 0  . zolpidem (AMBIEN) 5 MG tablet Take 1 tablet (5 mg total) by mouth at bedtime as needed for sleep. (Patient not taking: Reported on 11/08/2016) 5 tablet 0   No current facility-administered medications for this visit.      Allergies  Allergen Reactions  . No  Known Allergies     REVIEW OF SYSTEMS (negative unless checked):   Cardiac:  []  Chest pain or chest pressure? []  Shortness of breath upon activity? []  Shortness of breath when lying flat? []  Irregular heart rhythm?  Vascular:  []  Pain in calf, thigh, or hip brought on by walking? []  Pain in feet at night that wakes you up from your sleep? []  Blood clot in your veins? []  Leg swelling?  Pulmonary:  []  Oxygen at home? []  Productive cough? []  Wheezing?  Neurologic:  []  Sudden weakness in arms or legs? []  Sudden numbness in arms or legs? []  Sudden onset of difficult speaking or slurred speech? []   Temporary loss of vision in one eye? []  Problems with dizziness?  Gastrointestinal:  []  Blood in stool? []  Vomited blood?  Genitourinary:  []  Burning when urinating? []  Blood in urine? [x]  end stage renal disease-HD: T/R/S   Psychiatric:  []  Major depression  Hematologic:  []  Bleeding problems? []  Problems with blood clotting?  Dermatologic:  []  Rashes or ulcers?  Constitutional:  []  Fever or chills?  Ear/Nose/Throat:  []  Change in hearing? []  Nose bleeds? []  Sore throat?  Musculoskeletal:  []  Back pain? []  Joint pain? []  Muscle pain?   Physical Examination   Vitals:   07/06/17 1207  BP: (!) 140/96  Pulse: 92  Resp: 20  SpO2: 97%  Weight: 154 lb (69.9 kg)  Height: 6\' 1"  (1.854 m)   Body mass index is 20.32 kg/m.  General Alert, O x 3, WD, NAD  Pulmonary Sym exp, good B air movt, CTA B  Cardiac RRR, Nl S1, S2, no Murmurs, No rubs, No S3,S4  Vascular Vessel Right Left  Radial Palpable Palpable  Brachial Palpable Palpable  Ulnar Not palpable Not palpable    Musculo- skeletal M/S 5/5 throughout  , Extremities without ischemic changes, no thrill in L RC AVF, palpable cord where L RC AVF was previously    Neurologic Pain and light touch intact in extremities , Motor exam as listed above     Non-invasive Vascular Imaging   BUE Doppler (07/06/2017  ):   R arm:   Brachial: tri, 5.7 mm  Radial: tri, 3.1 mm  Ulnar: tri, 1.2 mm  L arm:   Brachial: tri, 5.7 mm  Radial: tri, 2.8 mm  Ulnar: tri, 1.8 mm  BUE Vein Mapping  (07/06/2017):   R arm: acceptable vein conduits include upper arm basilic  L arm: acceptable vein conduits include upper arm basilic   Medical Decision Making   Maron Stanzione is a 56 y.o. male who presents with ESRD requiring hemodialysis.    Based on vein mapping and examination, this patient's permanent access options include: L staged BVT vs R staged BVT.  I would first proceed with left single stage basilic vein transposition .  I had an extensive discussion with this patient in regards to the nature of access surgery, including risk, benefits, and alternatives.    The patient is aware that the risks of access surgery include but are not limited to: bleeding, infection, steal syndrome, nerve damage, ischemic monomelic neuropathy, failure of access to mature, and possible need for additional access procedures in the future.  The patient is aware that I construct transposition procedures in two stages, requiring a separate operation to complete the procedure.  The patient has agreed to proceed with the above procedure which will be scheduled 24 SEP 18.   Adele Barthel, MD, FACS [facility] [place] Office: [phone] Pager: [phone]

## 2017-07-09 NOTE — Anesthesia Preprocedure Evaluation (Addendum)
Anesthesia Evaluation  Patient identified by MRN, date of birth, ID band Patient awake    Reviewed: Allergy & Precautions, NPO status , Patient's Chart, lab work & pertinent test results  History of Anesthesia Complications Negative for: history of anesthetic complications  Airway Mallampati: I  TM Distance: >3 FB Neck ROM: Full    Dental  (+) Poor Dentition, Missing, Chipped, Dental Advisory Given   Pulmonary Current Smoker, former smoker,    breath sounds clear to auscultation       Cardiovascular hypertension, Pt. on medications (-) angina Rhythm:Regular Rate:Normal  '17 ECHO: EF 60-65%, mild MR, possible PFO?   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)     Musculoskeletal   Abdominal   Peds  Hematology  (+) Blood dyscrasia (Hb 10.5), anemia ,   Anesthesia Other Findings   Reproductive/Obstetrics                           Anesthesia Physical Anesthesia Plan  ASA: III  Anesthesia Plan: General   Post-op Pain Management:    Induction: Intravenous  PONV Risk Score and Plan: 1 and Ondansetron and Dexamethasone  Airway Management Planned: LMA  Additional Equipment:   Intra-op Plan:   Post-operative Plan:   Informed Consent: I have reviewed the patients History and Physical, chart, labs and discussed the procedure including the risks, benefits and alternatives for the proposed anesthesia with the patient or authorized representative who has indicated his/her understanding and acceptance.   Dental advisory given  Plan Discussed with: CRNA and Surgeon  Anesthesia Plan Comments: (Plan routine monitors, GA- LMA OK)       Anesthesia Quick Evaluation

## 2017-07-09 NOTE — Interval H&P Note (Signed)
   History and Physical Update  The patient was interviewed and re-examined.  The patient's previous History and Physical has been reviewed and is unchanged from my consult.  There is no change in the plan of care: Left first stage basilic vein transposition.   Risk, benefits, and alternatives to access surgery were discussed.    The patient is aware the risks include but are not limited to: bleeding, infection, steal syndrome, nerve damage, ischemic monomelic neuropathy, thrombosis, failure to mature, complications related to venous hypertension, need for additional procedures, death and stroke.    The patient agrees to proceed forward with the procedure.   Adele Barthel, MD, FACS [facility] Office: [phone] Pager: [phone]  07/09/2017, 7:20 AM

## 2017-07-09 NOTE — Anesthesia Procedure Notes (Signed)
Procedure Name: LMA Insertion Date/Time: 07/09/2017 9:51 AM Performed by: Salli Quarry Promiss Labarbera Pre-anesthesia Checklist: Patient identified, Emergency Drugs available, Suction available and Patient being monitored Patient Re-evaluated:Patient Re-evaluated prior to induction Oxygen Delivery Method: Circle System Utilized Preoxygenation: Pre-oxygenation with 100% oxygen Induction Type: IV induction Ventilation: Mask ventilation without difficulty LMA: LMA inserted LMA Size: 5.0 Number of attempts: 1 Placement Confirmation: positive ETCO2 Tube secured with: Tape Dental Injury: Teeth and Oropharynx as per pre-operative assessment

## 2017-07-09 NOTE — Transfer of Care (Signed)
Immediate Anesthesia Transfer of Care Note  Patient: Everet Flagg  Procedure(s) Performed: Procedure(s): [place] (Left)  Patient Location: PACU  Anesthesia Type:General  Level of Consciousness: awake, alert  and patient cooperative  Airway & Oxygen Therapy: Patient Spontanous Breathing and Patient connected to nasal cannula oxygen  Post-op Assessment: Report given to RN and Post -op Vital signs reviewed and stable  Post vital signs: Reviewed and stable  Last Vitals:  Vitals:   07/09/17 0647 07/09/17 0723  BP: (!) 170/116 (!) 181/106  Pulse:    Resp:    Temp:    SpO2:      Last Pain:  Vitals:   07/09/17 0645  TempSrc: Oral      Patients Stated Pain Goal: 1 (80/22/33 6122)  Complications: No apparent anesthesia complications

## 2017-07-09 NOTE — Progress Notes (Signed)
Pt. Stated he smoked marijuana yesterday,and smokes it several times a week when he has the money for it.  Notified Dr. Glennon Mac of pt's blood pressure. Pt. States "that's normal for me".

## 2017-07-10 ENCOUNTER — Encounter (HOSPITAL_COMMUNITY): Payer: Self-pay | Admitting: Vascular Surgery

## 2017-07-10 ENCOUNTER — Encounter: Payer: Self-pay | Admitting: Nephrology

## 2017-07-10 DIAGNOSIS — N185 Chronic kidney disease, stage 5: Secondary | ICD-10-CM

## 2017-07-31 ENCOUNTER — Telehealth: Payer: Self-pay | Admitting: Vascular Surgery

## 2017-07-31 NOTE — Telephone Encounter (Signed)
-----   Message from Mena Goes, RN sent at 07/31/2017 10:23 AM EDT ----- Regarding: 6-8 weeks may be a duplicate   ----- Message ----- From: Conrad , MD Sent: 07/10/2017   9:28 AM To: [facility] [address]  Timothy [name] [id number] October 14, 1961  PROCEDURE: left brachiocephalic arteriovenous fistula placement  Asst: RNFA  Follow-up: 6-8 weeks

## 2017-07-31 NOTE — Telephone Encounter (Signed)
Sched appt 09/21/17 at 11:30. Spoke to pt.

## 2017-09-18 NOTE — Progress Notes (Signed)
    Postoperative Access Visit   History of Present Illness   Timothy [name] is a 56 y.o. year old male who presents for postoperative follow-up for: left brachiocephalic arteriovenous fistula (Date: 07/09/17).  The patient's wounds are healed.  The patient notes no steal symptoms.  The patient is able to complete their activities of daily living.  The patient's current symptoms are: none.   Physical Examination   Vitals:   09/21/17 1146  BP: (!) 144/85  Pulse: 73  Resp: 20  Temp: (!) 97.1 F (36.2 C)  TempSrc: Oral  SpO2: 98%  Weight: 140 lb (63.5 kg)  Height: 6\' 1"  (1.854 m)    left arm Incision is healed, skin feels warm, hand grip is 5/5, sensation in digits is intact, palpable thrill, bruit can be auscultated, fistula is visible    Medical Decision Making   Timothy [name] is a 56 y.o. year old male who presents s/p left brachiocephalic arteriovenous fistula   The patient's access is ready for use.  The patient's tunneled dialysis catheter can be removed after two successful cannulations and completed dialysis treatments.  Patient's shallow fistula will require some modification in cannulation technique:  Pin fistula to avoid rolling then cannulate at a more shallow angle  Thank you for allowing [place] to participate in this patient's care.   Adele Barthel, MD, FACS [facility] Office: [phone] Pager: [phone]

## 2017-09-21 ENCOUNTER — Encounter: Payer: Self-pay | Admitting: Vascular Surgery

## 2017-09-21 ENCOUNTER — Ambulatory Visit (INDEPENDENT_AMBULATORY_CARE_PROVIDER_SITE_OTHER): Payer: Self-pay | Admitting: Vascular Surgery

## 2017-09-21 VITALS — BP 144/85 | HR 73 | Temp 97.1°F | Resp 20 | Ht 73.0 in | Wt 140.0 lb

## 2017-09-21 DIAGNOSIS — Z992 Dependence on renal dialysis: Secondary | ICD-10-CM

## 2017-09-21 DIAGNOSIS — N186 End stage renal disease: Secondary | ICD-10-CM

## 2018-01-13 ENCOUNTER — Observation Stay (HOSPITAL_COMMUNITY): Payer: Medicare Other

## 2018-01-13 ENCOUNTER — Encounter (HOSPITAL_COMMUNITY): Payer: Self-pay

## 2018-01-13 ENCOUNTER — Emergency Department (HOSPITAL_COMMUNITY): Payer: Medicare Other

## 2018-01-13 ENCOUNTER — Inpatient Hospital Stay (HOSPITAL_COMMUNITY)
Admission: EM | Admit: 2018-01-13 | Discharge: 2018-01-18 | DRG: 492 | Disposition: A | Discharge status: Home / Self Care | Payer: Medicare Other | Attending: Internal Medicine | Admitting: Internal Medicine

## 2018-01-13 ENCOUNTER — Other Ambulatory Visit: Payer: Self-pay

## 2018-01-13 DIAGNOSIS — I5032 Chronic diastolic (congestive) heart failure: Secondary | ICD-10-CM | POA: Diagnosis present

## 2018-01-13 DIAGNOSIS — E871 Hypo-osmolality and hyponatremia: Secondary | ICD-10-CM | POA: Diagnosis not present

## 2018-01-13 DIAGNOSIS — N186 End stage renal disease: Secondary | ICD-10-CM | POA: Diagnosis present

## 2018-01-13 DIAGNOSIS — S82142A Displaced bicondylar fracture of left tibia, initial encounter for closed fracture: Secondary | ICD-10-CM | POA: Diagnosis present

## 2018-01-13 DIAGNOSIS — F1110 Opioid abuse, uncomplicated: Secondary | ICD-10-CM | POA: Diagnosis present

## 2018-01-13 DIAGNOSIS — S80212A Abrasion, left knee, initial encounter: Secondary | ICD-10-CM | POA: Diagnosis present

## 2018-01-13 DIAGNOSIS — N2581 Secondary hyperparathyroidism of renal origin: Secondary | ICD-10-CM | POA: Diagnosis not present

## 2018-01-13 DIAGNOSIS — D62 Acute posthemorrhagic anemia: Secondary | ICD-10-CM | POA: Diagnosis not present

## 2018-01-13 DIAGNOSIS — M25072 Hemarthrosis, left ankle: Secondary | ICD-10-CM | POA: Diagnosis not present

## 2018-01-13 DIAGNOSIS — Z23 Encounter for immunization: Secondary | ICD-10-CM | POA: Diagnosis not present

## 2018-01-13 DIAGNOSIS — S61412A Laceration without foreign body of left hand, initial encounter: Secondary | ICD-10-CM | POA: Diagnosis not present

## 2018-01-13 DIAGNOSIS — I132 Hypertensive heart and chronic kidney disease with heart failure and with stage 5 chronic kidney disease, or end stage renal disease: Secondary | ICD-10-CM | POA: Diagnosis not present

## 2018-01-13 DIAGNOSIS — E8889 Other specified metabolic disorders: Secondary | ICD-10-CM | POA: Diagnosis present

## 2018-01-13 DIAGNOSIS — Z79899 Other long term (current) drug therapy: Secondary | ICD-10-CM

## 2018-01-13 DIAGNOSIS — Z992 Dependence on renal dialysis: Secondary | ICD-10-CM

## 2018-01-13 DIAGNOSIS — S82209A Unspecified fracture of shaft of unspecified tibia, initial encounter for closed fracture: Secondary | ICD-10-CM

## 2018-01-13 DIAGNOSIS — S82102A Unspecified fracture of upper end of left tibia, initial encounter for closed fracture: Secondary | ICD-10-CM | POA: Diagnosis not present

## 2018-01-13 DIAGNOSIS — S61402A Unspecified open wound of left hand, initial encounter: Secondary | ICD-10-CM | POA: Diagnosis present

## 2018-01-13 DIAGNOSIS — I10 Essential (primary) hypertension: Secondary | ICD-10-CM | POA: Diagnosis present

## 2018-01-13 DIAGNOSIS — T148XXA Other injury of unspecified body region, initial encounter: Secondary | ICD-10-CM

## 2018-01-13 DIAGNOSIS — Z87891 Personal history of nicotine dependence: Secondary | ICD-10-CM | POA: Diagnosis not present

## 2018-01-13 DIAGNOSIS — Z85038 Personal history of other malignant neoplasm of large intestine: Secondary | ICD-10-CM | POA: Diagnosis not present

## 2018-01-13 DIAGNOSIS — B1920 Unspecified viral hepatitis C without hepatic coma: Secondary | ICD-10-CM | POA: Diagnosis not present

## 2018-01-13 DIAGNOSIS — N400 Enlarged prostate without lower urinary tract symptoms: Secondary | ICD-10-CM | POA: Diagnosis present

## 2018-01-13 DIAGNOSIS — W19XXXA Unspecified fall, initial encounter: Secondary | ICD-10-CM

## 2018-01-13 DIAGNOSIS — S82192A Other fracture of upper end of left tibia, initial encounter for closed fracture: Secondary | ICD-10-CM

## 2018-01-13 LAB — COMPREHENSIVE METABOLIC PANEL
ALK PHOS: 47 U/L (ref 38–126)
ALT: 29 U/L (ref 17–63)
AST: 38 U/L (ref 15–41)
Albumin: 3.2 g/dL — ABNORMAL LOW (ref 3.5–5.0)
Anion gap: 12 (ref 5–15)
BILIRUBIN TOTAL: 0.9 mg/dL (ref 0.3–1.2)
BUN: 39 mg/dL — ABNORMAL HIGH (ref 6–20)
CALCIUM: 8.8 mg/dL — AB (ref 8.9–10.3)
CHLORIDE: 96 mmol/L — AB (ref 101–111)
CO2: 27 mmol/L (ref 22–32)
CREATININE: 9.04 mg/dL — AB (ref 0.61–1.24)
GFR, EST AFRICAN AMERICAN: 7 mL/min — AB (ref >60)
GFR, EST NON AFRICAN AMERICAN: 6 mL/min — AB (ref >60)
Glucose, Bld: 93 mg/dL (ref 65–99)
Potassium: 4.9 mmol/L (ref 3.5–5.1)
Sodium: 135 mmol/L (ref 135–145)
Total Protein: 6.6 g/dL (ref 6.5–8.1)

## 2018-01-13 LAB — CBC WITH DIFFERENTIAL/PLATELET
BASOS ABS: 0 10*3/uL (ref 0.0–0.1)
BASOS PCT: 0 %
EOS ABS: 0 10*3/uL (ref 0.0–0.7)
EOS PCT: 0 %
HCT: 33.8 % — ABNORMAL LOW (ref 39.0–52.0)
Hemoglobin: 10.8 g/dL — ABNORMAL LOW (ref 13.0–17.0)
Lymphocytes Relative: 11 %
Lymphs Abs: 0.9 10*3/uL (ref 0.7–4.0)
MCH: 30.3 pg (ref 26.0–34.0)
MCHC: 32 g/dL (ref 30.0–36.0)
MCV: 94.7 fL (ref 78.0–100.0)
MONO ABS: 1.2 10*3/uL — AB (ref 0.1–1.0)
MONOS PCT: 15 %
NEUTROS ABS: 6.3 10*3/uL (ref 1.7–7.7)
Neutrophils Relative %: 74 %
PLATELETS: 268 10*3/uL (ref 150–400)
RBC: 3.57 MIL/uL — ABNORMAL LOW (ref 4.22–5.81)
RDW: 14.5 % (ref 11.5–15.5)
WBC: 8.5 10*3/uL (ref 4.0–10.5)

## 2018-01-13 LAB — PROTIME-INR
INR: 1.02
PROTHROMBIN TIME: 13.3 s (ref 11.4–15.2)

## 2018-01-13 LAB — ETHANOL

## 2018-01-13 LAB — TYPE AND SCREEN
ABO/RH(D): B POS
Antibody Screen: NEGATIVE

## 2018-01-13 MED ORDER — HYDROMORPHONE HCL 1 MG/ML IJ SOLN
1.0000 mg | Freq: Once | INTRAMUSCULAR | Status: AC
  Administered 2018-01-13: 1 mg via INTRAVENOUS
  Filled 2018-01-13: qty 1

## 2018-01-13 MED ORDER — HYDROMORPHONE HCL 1 MG/ML IJ SOLN: 1.0000 mg | Freq: Once | INTRAMUSCULAR | Status: DC

## 2018-01-13 MED ORDER — MORPHINE SULFATE (PF) 4 MG/ML IV SOLN: 4.0000 mg | Freq: Once | INTRAVENOUS | Status: DC

## 2018-01-13 MED ORDER — MORPHINE SULFATE (PF) 4 MG/ML IV SOLN
4.0000 mg | Freq: Once | INTRAVENOUS | Status: AC
  Administered 2018-01-13: 4 mg via INTRAVENOUS
  Filled 2018-01-13: qty 1

## 2018-01-13 MED ORDER — AMLODIPINE BESYLATE 10 MG PO TABS
10.0000 mg | ORAL_TABLET | Freq: Every day | ORAL | Status: DC
  Administered 2018-01-14 – 2018-01-18 (×4): 10 mg via ORAL
  Filled 2018-01-13 (×4): qty 1

## 2018-01-13 MED ORDER — ONDANSETRON HCL 4 MG/2ML IJ SOLN: 4.0000 mg | Freq: Four times a day (QID) | INTRAMUSCULAR | Status: DC | PRN

## 2018-01-13 MED ORDER — CYCLOBENZAPRINE HCL 10 MG PO TABS
5.0000 mg | ORAL_TABLET | Freq: Three times a day (TID) | ORAL | Status: DC
  Administered 2018-01-13: 5 mg via ORAL
  Filled 2018-01-13: qty 1

## 2018-01-13 MED ORDER — HYDROMORPHONE HCL 1 MG/ML IJ SOLN
1.0000 mg | INTRAMUSCULAR | Status: AC | PRN
  Administered 2018-01-13 – 2018-01-14 (×3): 1 mg via INTRAVENOUS
  Filled 2018-01-13 (×5): qty 1

## 2018-01-13 MED ORDER — LIDOCAINE-EPINEPHRINE (PF) 2 %-1:200000 IJ SOLN
20.0000 mL | Freq: Once | INTRAMUSCULAR | Status: AC
  Administered 2018-01-13: 20 mL
  Filled 2018-01-13: qty 20

## 2018-01-13 MED ORDER — ACETAMINOPHEN 650 MG RE SUPP: 650.0000 mg | Freq: Four times a day (QID) | RECTAL | Status: DC | PRN

## 2018-01-13 MED ORDER — LIDOCAINE HCL (PF) 1 % IJ SOLN
INTRAMUSCULAR | Status: AC
  Administered 2018-01-14: 05:00:00
  Filled 2018-01-13: qty 30

## 2018-01-13 MED ORDER — CALCIUM ACETATE (PHOS BINDER) 667 MG PO CAPS
1334.0000 mg | ORAL_CAPSULE | Freq: Three times a day (TID) | ORAL | Status: DC
  Administered 2018-01-14 – 2018-01-18 (×9): 1334 mg via ORAL
  Filled 2018-01-13 (×9): qty 2

## 2018-01-13 MED ORDER — SODIUM CHLORIDE 0.9 % IV SOLN: INTRAVENOUS | Status: DC

## 2018-01-13 MED ORDER — BACITRACIN ZINC 500 UNIT/GM EX OINT
TOPICAL_OINTMENT | Freq: Every day | CUTANEOUS | Status: DC
  Administered 2018-01-15: 11:00:00 via TOPICAL
  Administered 2018-01-17: 1 via TOPICAL
  Administered 2018-01-18: 09:00:00 via TOPICAL
  Filled 2018-01-13: qty 28.35

## 2018-01-13 MED ORDER — CYCLOBENZAPRINE HCL 10 MG PO TABS
10.0000 mg | ORAL_TABLET | Freq: Once | ORAL | Status: AC
  Administered 2018-01-13: 10 mg via ORAL
  Filled 2018-01-13: qty 1

## 2018-01-13 MED ORDER — ACETAMINOPHEN 325 MG PO TABS: 650.0000 mg | ORAL_TABLET | Freq: Four times a day (QID) | ORAL | Status: DC | PRN

## 2018-01-13 MED ORDER — OXYCODONE-ACETAMINOPHEN 5-325 MG PO TABS
1.0000 | ORAL_TABLET | ORAL | Status: DC | PRN
  Administered 2018-01-13: 1 via ORAL
  Filled 2018-01-13: qty 1

## 2018-01-13 MED ORDER — HYDROMORPHONE HCL 1 MG/ML IJ SOLN: 1.0000 mg | INTRAMUSCULAR | Status: DC | PRN

## 2018-01-13 MED ORDER — ONDANSETRON HCL 4 MG PO TABS: 4.0000 mg | ORAL_TABLET | Freq: Four times a day (QID) | ORAL | Status: DC | PRN

## 2018-01-13 MED ORDER — TETANUS-DIPHTH-ACELL PERTUSSIS 5-2.5-18.5 LF-MCG/0.5 IM SUSP
0.5000 mL | Freq: Once | INTRAMUSCULAR | Status: AC
  Administered 2018-01-13: 0.5 mL via INTRAMUSCULAR
  Filled 2018-01-13: qty 0.5

## 2018-01-13 MED ORDER — DILTIAZEM HCL ER COATED BEADS 180 MG PO CP24
360.0000 mg | ORAL_CAPSULE | Freq: Every day | ORAL | Status: DC
  Administered 2018-01-14 – 2018-01-18 (×4): 360 mg via ORAL
  Filled 2018-01-13 (×4): qty 2
  Filled 2018-01-13: qty 1

## 2018-01-13 NOTE — ED Notes (Signed)
To CT

## 2018-01-13 NOTE — ED Notes (Signed)
Patient transported to X-ray 

## 2018-01-13 NOTE — Progress Notes (Signed)
Gave pt Flexeril and he states " what a waste, that dont work.  I dont want that."   I gave him the choice to take or not and he took it.

## 2018-01-13 NOTE — Progress Notes (Signed)
Per pt request messaged MD for muscle cramps and more pain med, pt cursing and moaning he is in pain 20/10 L knee and L hand

## 2018-01-13 NOTE — Procedures (Signed)
Procedure Name: Laceration Repair Indication: Reduce risk of infection Location: proximal thenar aspect of left hand Pre-Procedure Diagnosis: Laceration Post-Procedure Diagnosis: Repaired Laceration Informed consent was obtained before procedure started. PROCEDURE: The appropriate timeout was taken. The area was prepped and draped in the usual sterile fashion. Local anesthesia was not achieved using 2 cc of Lidocaine 1% without epinephrine. The wound was copiously irrigated with 1L NS. Two 4-0 Prolene interrupted sutures were placed. Estimated blood loss was less than 0.5 mL. A dressing was applied to the area and anticipatory guidance, as well as standard post-procedure care, was explained. Return precautions are given. The patient tolerated the procedure well without complications.  Follow-up visit needed for suture removal and evaluation of the laceration 10-14 days

## 2018-01-13 NOTE — ED Notes (Signed)
ED Provider at bedside. 
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
## 2018-01-13 NOTE — Progress Notes (Signed)
Orthopedic Tech Progress Note Patient Details:  Timothy [name] 01-Oct-1961 [id number]  Ortho Devices Type of Ortho Device: Knee Immobilizer Ortho Device/Splint Location: LLE Ortho Device/Splint Interventions: Ordered, Application   Post Interventions Patient Tolerated: Well Instructions Provided: Care of device   Braulio Bosch 01/13/2018, 5:01 PM
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
## 2018-01-13 NOTE — Progress Notes (Signed)
Pt crying and moaning, when I inject IV pain med he states, "That's not enough."
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
## 2018-01-14 DIAGNOSIS — Z23 Encounter for immunization: Secondary | ICD-10-CM | POA: Diagnosis present

## 2018-01-14 DIAGNOSIS — D62 Acute posthemorrhagic anemia: Secondary | ICD-10-CM | POA: Diagnosis not present

## 2018-01-14 DIAGNOSIS — S80212A Abrasion, left knee, initial encounter: Secondary | ICD-10-CM | POA: Diagnosis present

## 2018-01-14 DIAGNOSIS — S82142A Displaced bicondylar fracture of left tibia, initial encounter for closed fracture: Secondary | ICD-10-CM | POA: Diagnosis present

## 2018-01-14 DIAGNOSIS — Z85038 Personal history of other malignant neoplasm of large intestine: Secondary | ICD-10-CM | POA: Diagnosis not present

## 2018-01-14 DIAGNOSIS — Z992 Dependence on renal dialysis: Secondary | ICD-10-CM | POA: Diagnosis not present

## 2018-01-14 DIAGNOSIS — E8889 Other specified metabolic disorders: Secondary | ICD-10-CM | POA: Diagnosis present

## 2018-01-14 DIAGNOSIS — W19XXXA Unspecified fall, initial encounter: Secondary | ICD-10-CM | POA: Diagnosis not present

## 2018-01-14 DIAGNOSIS — S82192A Other fracture of upper end of left tibia, initial encounter for closed fracture: Secondary | ICD-10-CM | POA: Diagnosis not present

## 2018-01-14 DIAGNOSIS — S61412A Laceration without foreign body of left hand, initial encounter: Secondary | ICD-10-CM | POA: Diagnosis present

## 2018-01-14 DIAGNOSIS — I5032 Chronic diastolic (congestive) heart failure: Secondary | ICD-10-CM | POA: Diagnosis present

## 2018-01-14 DIAGNOSIS — F1110 Opioid abuse, uncomplicated: Secondary | ICD-10-CM | POA: Diagnosis present

## 2018-01-14 DIAGNOSIS — Z87891 Personal history of nicotine dependence: Secondary | ICD-10-CM | POA: Diagnosis not present

## 2018-01-14 DIAGNOSIS — I10 Essential (primary) hypertension: Secondary | ICD-10-CM | POA: Diagnosis not present

## 2018-01-14 DIAGNOSIS — N186 End stage renal disease: Secondary | ICD-10-CM | POA: Diagnosis present

## 2018-01-14 DIAGNOSIS — S82102A Unspecified fracture of upper end of left tibia, initial encounter for closed fracture: Secondary | ICD-10-CM | POA: Diagnosis not present

## 2018-01-14 DIAGNOSIS — M25072 Hemarthrosis, left ankle: Secondary | ICD-10-CM | POA: Diagnosis present

## 2018-01-14 DIAGNOSIS — I132 Hypertensive heart and chronic kidney disease with heart failure and with stage 5 chronic kidney disease, or end stage renal disease: Secondary | ICD-10-CM | POA: Diagnosis present

## 2018-01-14 DIAGNOSIS — Z79899 Other long term (current) drug therapy: Secondary | ICD-10-CM | POA: Diagnosis not present

## 2018-01-14 DIAGNOSIS — N2581 Secondary hyperparathyroidism of renal origin: Secondary | ICD-10-CM | POA: Diagnosis present

## 2018-01-14 DIAGNOSIS — S82209A Unspecified fracture of shaft of unspecified tibia, initial encounter for closed fracture: Secondary | ICD-10-CM | POA: Insufficient documentation

## 2018-01-14 DIAGNOSIS — B1920 Unspecified viral hepatitis C without hepatic coma: Secondary | ICD-10-CM | POA: Diagnosis not present

## 2018-01-14 DIAGNOSIS — N400 Enlarged prostate without lower urinary tract symptoms: Secondary | ICD-10-CM | POA: Diagnosis present

## 2018-01-14 DIAGNOSIS — E871 Hypo-osmolality and hyponatremia: Secondary | ICD-10-CM | POA: Diagnosis not present

## 2018-01-14 LAB — BASIC METABOLIC PANEL
ANION GAP: 14 (ref 5–15)
BUN: 49 mg/dL — ABNORMAL HIGH (ref 6–20)
CO2: 26 mmol/L (ref 22–32)
Calcium: 9.1 mg/dL (ref 8.9–10.3)
Chloride: 94 mmol/L — ABNORMAL LOW (ref 101–111)
Creatinine, Ser: 10.63 mg/dL — ABNORMAL HIGH (ref 0.61–1.24)
GFR calc Af Amer: 5 mL/min — ABNORMAL LOW (ref >60)
GFR calc non Af Amer: 5 mL/min — ABNORMAL LOW (ref >60)
GLUCOSE: 101 mg/dL — AB (ref 65–99)
POTASSIUM: 4.5 mmol/L (ref 3.5–5.1)
Sodium: 134 mmol/L — ABNORMAL LOW (ref 135–145)

## 2018-01-14 LAB — CBC
HEMATOCRIT: 34 % — AB (ref 39.0–52.0)
HEMOGLOBIN: 11.2 g/dL — AB (ref 13.0–17.0)
MCH: 31.1 pg (ref 26.0–34.0)
MCHC: 32.9 g/dL (ref 30.0–36.0)
MCV: 94.4 fL (ref 78.0–100.0)
Platelets: 293 10*3/uL (ref 150–400)
RBC: 3.6 MIL/uL — ABNORMAL LOW (ref 4.22–5.81)
RDW: 14.7 % (ref 11.5–15.5)
WBC: 8.5 10*3/uL (ref 4.0–10.5)

## 2018-01-14 LAB — HEPATITIS B SURFACE ANTIGEN: Hepatitis B Surface Ag: NEGATIVE

## 2018-01-14 LAB — HIV ANTIBODY (ROUTINE TESTING W REFLEX): HIV Screen 4th Generation wRfx: NONREACTIVE

## 2018-01-14 MED ORDER — HYDROMORPHONE HCL 1 MG/ML IJ SOLN
1.0000 mg | Freq: Once | INTRAMUSCULAR | Status: AC
  Administered 2018-01-14: 1 mg via INTRAMUSCULAR
  Filled 2018-01-14: qty 1

## 2018-01-14 MED ORDER — HYDROCODONE-ACETAMINOPHEN 5-325 MG PO TABS
1.0000 | ORAL_TABLET | Freq: Four times a day (QID) | ORAL | Status: DC
  Administered 2018-01-14 – 2018-01-15 (×5): 1 via ORAL
  Filled 2018-01-14 (×5): qty 1

## 2018-01-14 MED ORDER — CYCLOBENZAPRINE HCL 10 MG PO TABS
10.0000 mg | ORAL_TABLET | Freq: Once | ORAL | Status: AC
  Administered 2018-01-14: 10 mg via ORAL
  Filled 2018-01-14: qty 1

## 2018-01-14 MED ORDER — METHOCARBAMOL 750 MG PO TABS
750.0000 mg | ORAL_TABLET | Freq: Four times a day (QID) | ORAL | Status: DC | PRN
  Administered 2018-01-14: 750 mg via ORAL
  Filled 2018-01-14: qty 1

## 2018-01-14 MED ORDER — CEFAZOLIN SODIUM-DEXTROSE 2-4 GM/100ML-% IV SOLN
2.0000 g | INTRAVENOUS | Status: AC
  Administered 2018-01-15: 2 g via INTRAVENOUS
  Filled 2018-01-14: qty 100

## 2018-01-14 MED ORDER — PENTAFLUOROPROP-TETRAFLUOROETH EX AERO: 1.0000 "application " | INHALATION_SPRAY | CUTANEOUS | Status: DC | PRN

## 2018-01-14 MED ORDER — SODIUM CHLORIDE 0.9 % IV SOLN: 100.0000 mL | INTRAVENOUS | Status: DC | PRN

## 2018-01-14 MED ORDER — METHOCARBAMOL 750 MG PO TABS
750.0000 mg | ORAL_TABLET | Freq: Four times a day (QID) | ORAL | Status: DC
  Administered 2018-01-14 – 2018-01-18 (×13): 750 mg via ORAL
  Filled 2018-01-14 (×14): qty 1

## 2018-01-14 MED ORDER — HEPARIN SODIUM (PORCINE) 1000 UNIT/ML DIALYSIS: 1000.0000 [IU] | INTRAMUSCULAR | Status: DC | PRN

## 2018-01-14 MED ORDER — HYDROMORPHONE HCL 1 MG/ML IJ SOLN
1.0000 mg | INTRAMUSCULAR | Status: AC | PRN
  Administered 2018-01-14 (×3): 1 mg via INTRAVENOUS
  Filled 2018-01-14 (×3): qty 1
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
## 2018-01-14 NOTE — Progress Notes (Addendum)
Orthopaedic Trauma Progress Note  S: Having a lot of knee pain, in knee immobilizer currently. Complaining of spasms  O:  Vitals:   01/13/18 2011 01/14/18 0429  BP: (!) 153/77 (!) 153/98  Pulse: 74 78  Resp: 18 18  Temp: 98.7 F (37.1 C) 98.4 F (36.9 C)  SpO2: 96% (!) 88%   Gen: Awake and alert, in some discomfort Knee immobilizer in place, compartments soft and compressible. Wiggles toes but doesn't move ankle due to pain. Endorses sensation to top and bottom of foot  Labs:  CBC    Component Value Date/Time   WBC 8.5 01/14/2018 0504   RBC 3.60 (L) 01/14/2018 0504   HGB 11.2 (L) 01/14/2018 0504   HCT 34.0 (L) 01/14/2018 0504   PLT 293 01/14/2018 0504   MCV 94.4 01/14/2018 0504   MCH 31.1 01/14/2018 0504   MCHC 32.9 01/14/2018 0504   RDW 14.7 01/14/2018 0504   LYMPHSABS 0.9 01/13/2018 1249   MONOABS 1.2 (H) 01/13/2018 1249   EOSABS 0.0 01/13/2018 1249   BASOSABS 0.0 01/13/2018 1249     A/P: 57 yo male with ESRD on dialysis and hx of substance abuse with posteromedial plateau fracture and left hand laceration  Discussed care with my partner Dr. Marcelino Scot. He feels that fracture would do better with fixation as patient is likely unreliable to follow WB restrictions. Will tentatively plan for surgery tomorrow.  Please make NPO past midnight.  Weightbearing: NWB LLE Insicional and dressing care: OK to remove dressings in 3 days and leave open to air with dry gauze PRN Orthopedic device(s): knee immobilizer Showering: Okay to shower after dressings removed VTE prophylaxis: Aspirin 325mg  BID 30 days Pain control: Per primary team, will be difficult due to substance abuse history. Added robaxin to regimen Follow - up plan: TBD  Shona Needles, MD [facility] [phone] (phone)
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
## 2018-01-14 NOTE — Progress Notes (Signed)
Patient requested to end trmt early at 2020.  He states he only runs 3 hours outpatient.  Asked patient to receive trmt until 9pm to at least receive 3 hours trmt.  H stated, "I'll try". Patient requested to end trmt at 2105, signed AMA form for HD.  Patient rinsed back, stable post trmt.

## 2018-01-14 NOTE — Progress Notes (Signed)
Messaged MD pt writhing, moaning, cursing in pain, restless, picking at dressings, crying and sobbing.

## 2018-01-14 NOTE — Plan of Care (Signed)
Reviewed HD trmt orders for today.  Patient verbalized information received.

## 2018-01-15 ENCOUNTER — Inpatient Hospital Stay (HOSPITAL_COMMUNITY): Payer: Medicare Other | Admitting: Anesthesiology

## 2018-01-15 ENCOUNTER — Inpatient Hospital Stay (HOSPITAL_COMMUNITY): Payer: Medicare Other

## 2018-01-15 ENCOUNTER — Encounter (HOSPITAL_COMMUNITY)
Admission: EM | Disposition: A | Discharge status: Home / Self Care | Payer: Self-pay | Source: Home / Self Care | Attending: Internal Medicine

## 2018-01-15 ENCOUNTER — Encounter (HOSPITAL_COMMUNITY): Payer: Self-pay | Admitting: Certified Registered"

## 2018-01-15 HISTORY — PX: ORIF TIBIA PLATEAU: SHX2132

## 2018-01-15 LAB — CBC
HEMATOCRIT: 35.7 % — AB (ref 39.0–52.0)
HEMOGLOBIN: 11.6 g/dL — AB (ref 13.0–17.0)
MCH: 30.6 pg (ref 26.0–34.0)
MCHC: 32.5 g/dL (ref 30.0–36.0)
MCV: 94.2 fL (ref 78.0–100.0)
Platelets: 283 10*3/uL (ref 150–400)
RBC: 3.79 MIL/uL — ABNORMAL LOW (ref 4.22–5.81)
RDW: 14.8 % (ref 11.5–15.5)
WBC: 11.9 10*3/uL — ABNORMAL HIGH (ref 4.0–10.5)

## 2018-01-15 LAB — HEPATITIS PANEL, ACUTE
HCV Ab: >11 s/co ratio — ABNORMAL HIGH (ref 0.0–0.9)
HEP B C IGM: NEGATIVE
Hep A IgM: NEGATIVE
Hepatitis B Surface Ag: NEGATIVE

## 2018-01-15 LAB — BASIC METABOLIC PANEL
Anion gap: 16 — ABNORMAL HIGH (ref 5–15)
BUN: 28 mg/dL — ABNORMAL HIGH (ref 6–20)
CHLORIDE: 93 mmol/L — AB (ref 101–111)
CO2: 26 mmol/L (ref 22–32)
CREATININE: 7.76 mg/dL — AB (ref 0.61–1.24)
Calcium: 8.8 mg/dL — ABNORMAL LOW (ref 8.9–10.3)
GFR calc non Af Amer: 7 mL/min — ABNORMAL LOW (ref >60)
GFR, EST AFRICAN AMERICAN: 8 mL/min — AB (ref >60)
Glucose, Bld: 97 mg/dL (ref 65–99)
Potassium: 4.1 mmol/L (ref 3.5–5.1)
Sodium: 135 mmol/L (ref 135–145)

## 2018-01-15 LAB — MRSA PCR SCREENING: MRSA by PCR: NEGATIVE

## 2018-01-15 LAB — APTT: APTT: 39 s — AB (ref 24–36)

## 2018-01-15 LAB — PROTIME-INR
INR: 1.11
Prothrombin Time: 14.2 seconds (ref 11.4–15.2)

## 2018-01-15 SURGERY — OPEN REDUCTION INTERNAL FIXATION (ORIF) TIBIAL PLATEAU
Anesthesia: General | Laterality: Left

## 2018-01-15 MED ORDER — FENTANYL CITRATE (PF) 250 MCG/5ML IJ SOLN
INTRAMUSCULAR | Status: AC
  Filled 2018-01-15: qty 5

## 2018-01-15 MED ORDER — HYDROCODONE-ACETAMINOPHEN 5-325 MG PO TABS
1.0000 | ORAL_TABLET | Freq: Four times a day (QID) | ORAL | Status: DC
  Administered 2018-01-15 – 2018-01-18 (×11): 2 via ORAL
  Filled 2018-01-15 (×11): qty 2

## 2018-01-15 MED ORDER — GLYCOPYRROLATE 0.2 MG/ML IV SOSY
PREFILLED_SYRINGE | INTRAVENOUS | Status: DC | PRN
  Administered 2018-01-15: 0.6 mg via INTRAVENOUS

## 2018-01-15 MED ORDER — MIDAZOLAM HCL 2 MG/2ML IJ SOLN
INTRAMUSCULAR | Status: AC
  Administered 2018-01-15: 2 mg via INTRAVENOUS
  Filled 2018-01-15: qty 2

## 2018-01-15 MED ORDER — CEFAZOLIN SODIUM-DEXTROSE 1-4 GM/50ML-% IV SOLN
1.0000 g | Freq: Four times a day (QID) | INTRAVENOUS | Status: AC
  Administered 2018-01-15 – 2018-01-16 (×3): 1 g via INTRAVENOUS
  Filled 2018-01-15 (×3): qty 50

## 2018-01-15 MED ORDER — HYDROMORPHONE HCL 1 MG/ML IJ SOLN
1.0000 mg | INTRAMUSCULAR | Status: DC | PRN
  Administered 2018-01-16 – 2018-01-17 (×2): 1 mg via INTRAVENOUS
  Filled 2018-01-15 (×2): qty 1

## 2018-01-15 MED ORDER — METOCLOPRAMIDE HCL 5 MG/ML IJ SOLN: 5.0000 mg | Freq: Three times a day (TID) | INTRAMUSCULAR | Status: DC | PRN

## 2018-01-15 MED ORDER — ONDANSETRON HCL 4 MG/2ML IJ SOLN
INTRAMUSCULAR | Status: DC | PRN
  Administered 2018-01-15: 4 mg via INTRAVENOUS

## 2018-01-15 MED ORDER — SODIUM CHLORIDE 0.9 % IV SOLN
INTRAVENOUS | Status: DC
  Administered 2018-01-15 – 2018-01-16 (×4): via INTRAVENOUS

## 2018-01-15 MED ORDER — LIDOCAINE HCL (CARDIAC) 20 MG/ML IV SOLN
INTRAVENOUS | Status: AC
Start: 2018-01-15 — End: ?
  Filled 2018-01-15: qty 5

## 2018-01-15 MED ORDER — HYDROMORPHONE HCL 1 MG/ML IJ SOLN
0.2500 mg | INTRAMUSCULAR | Status: DC | PRN
  Administered 2018-01-15 (×2): 0.5 mg via INTRAVENOUS

## 2018-01-15 MED ORDER — NEOSTIGMINE METHYLSULFATE 5 MG/5ML IV SOSY
PREFILLED_SYRINGE | INTRAVENOUS | Status: DC | PRN
  Administered 2018-01-15: 3 mg via INTRAVENOUS

## 2018-01-15 MED ORDER — ONDANSETRON HCL 4 MG/2ML IJ SOLN
INTRAMUSCULAR | Status: AC
  Filled 2018-01-15: qty 2

## 2018-01-15 MED ORDER — 0.9 % SODIUM CHLORIDE (POUR BTL) OPTIME
TOPICAL | Status: DC | PRN
  Administered 2018-01-15: 1000 mL

## 2018-01-15 MED ORDER — DOCUSATE SODIUM 100 MG PO CAPS
100.0000 mg | ORAL_CAPSULE | Freq: Two times a day (BID) | ORAL | Status: DC
  Administered 2018-01-15 – 2018-01-18 (×6): 100 mg via ORAL
  Filled 2018-01-15 (×5): qty 1

## 2018-01-15 MED ORDER — ROCURONIUM BROMIDE 10 MG/ML (PF) SYRINGE
PREFILLED_SYRINGE | INTRAVENOUS | Status: AC
  Filled 2018-01-15: qty 5

## 2018-01-15 MED ORDER — DOCUSATE SODIUM 100 MG PO CAPS
100.0000 mg | ORAL_CAPSULE | Freq: Two times a day (BID) | ORAL | Status: DC
  Filled 2018-01-15: qty 1

## 2018-01-15 MED ORDER — MIDAZOLAM HCL 2 MG/2ML IJ SOLN
INTRAMUSCULAR | Status: DC | PRN
  Administered 2018-01-15: 2 mg via INTRAVENOUS

## 2018-01-15 MED ORDER — ONDANSETRON HCL 4 MG PO TABS: 4.0000 mg | ORAL_TABLET | Freq: Four times a day (QID) | ORAL | Status: DC | PRN

## 2018-01-15 MED ORDER — HYDROMORPHONE HCL 1 MG/ML IJ SOLN
INTRAMUSCULAR | Status: AC
  Administered 2018-01-15: 0.5 mg via INTRAVENOUS
  Filled 2018-01-15: qty 1

## 2018-01-15 MED ORDER — LIDOCAINE 2% (20 MG/ML) 5 ML SYRINGE
INTRAMUSCULAR | Status: DC | PRN
  Administered 2018-01-15: 70 mg via INTRAVENOUS

## 2018-01-15 MED ORDER — MIDAZOLAM HCL 2 MG/2ML IJ SOLN
INTRAMUSCULAR | Status: AC
  Filled 2018-01-15: qty 2

## 2018-01-15 MED ORDER — PROPOFOL 10 MG/ML IV BOLUS
INTRAVENOUS | Status: AC
  Filled 2018-01-15: qty 20

## 2018-01-15 MED ORDER — METOCLOPRAMIDE HCL 5 MG PO TABS: 5.0000 mg | ORAL_TABLET | Freq: Three times a day (TID) | ORAL | Status: DC | PRN

## 2018-01-15 MED ORDER — MIDAZOLAM HCL 2 MG/2ML IJ SOLN
2.0000 mg | Freq: Once | INTRAMUSCULAR | Status: AC
  Administered 2018-01-15: 2 mg via INTRAVENOUS

## 2018-01-15 MED ORDER — SUCCINYLCHOLINE CHLORIDE 20 MG/ML IJ SOLN
INTRAMUSCULAR | Status: DC | PRN
  Administered 2018-01-15: 100 mg via INTRAVENOUS

## 2018-01-15 MED ORDER — ONDANSETRON HCL 4 MG/2ML IJ SOLN: 4.0000 mg | Freq: Four times a day (QID) | INTRAMUSCULAR | Status: DC | PRN

## 2018-01-15 MED ORDER — FENTANYL CITRATE (PF) 100 MCG/2ML IJ SOLN
INTRAMUSCULAR | Status: DC | PRN
  Administered 2018-01-15: 150 ug via INTRAVENOUS
  Administered 2018-01-15 (×3): 100 ug via INTRAVENOUS

## 2018-01-15 MED ORDER — ROCURONIUM BROMIDE 10 MG/ML (PF) SYRINGE
PREFILLED_SYRINGE | INTRAVENOUS | Status: DC | PRN
  Administered 2018-01-15: 20 mg via INTRAVENOUS
  Administered 2018-01-15: 30 mg via INTRAVENOUS

## 2018-01-15 MED ORDER — PROPOFOL 10 MG/ML IV BOLUS
INTRAVENOUS | Status: DC | PRN
  Administered 2018-01-15: 110 mg via INTRAVENOUS

## 2018-01-15 MED ORDER — DEXAMETHASONE SODIUM PHOSPHATE 10 MG/ML IJ SOLN
INTRAMUSCULAR | Status: DC | PRN
  Administered 2018-01-15: 10 mg via INTRAVENOUS

## 2018-01-15 MED ORDER — ENOXAPARIN SODIUM 30 MG/0.3ML ~~LOC~~ SOLN
30.0000 mg | SUBCUTANEOUS | Status: DC
  Administered 2018-01-16 – 2018-01-18 (×2): 30 mg via SUBCUTANEOUS
  Filled 2018-01-15 (×2): qty 0.3

## 2018-01-15 MED ORDER — PROMETHAZINE HCL 25 MG/ML IJ SOLN: 6.2500 mg | INTRAMUSCULAR | Status: DC | PRN

## 2018-01-15 MED ORDER — NEOSTIGMINE METHYLSULFATE 5 MG/5ML IV SOSY
PREFILLED_SYRINGE | INTRAVENOUS | Status: AC
  Filled 2018-01-15: qty 5

## 2018-01-15 SURGICAL SUPPLY — 79 items
BANDAGE ACE 4X5 VEL STRL LF (GAUZE/BANDAGES/DRESSINGS) ×2 IMPLANT
BANDAGE ACE 6X5 VEL STRL LF (GAUZE/BANDAGES/DRESSINGS) ×2 IMPLANT
BANDAGE ESMARK 6X9 LF (GAUZE/BANDAGES/DRESSINGS) ×1 IMPLANT
BLADE CLIPPER SURG (BLADE) IMPLANT
BLADE SURG 10 STRL SS (BLADE) ×2 IMPLANT
BLADE SURG 15 STRL LF DISP TIS (BLADE) ×1 IMPLANT
BLADE SURG 15 STRL SS (BLADE) ×1
BNDG COHESIVE 4X5 TAN STRL (GAUZE/BANDAGES/DRESSINGS) ×2 IMPLANT
BNDG ESMARK 6X9 LF (GAUZE/BANDAGES/DRESSINGS) ×2
BNDG GAUZE ELAST 4 BULKY (GAUZE/BANDAGES/DRESSINGS) ×2 IMPLANT
BRUSH SCRUB SURG 4.25 DISP (MISCELLANEOUS) ×4 IMPLANT
CANISTER SUCT 3000ML PPV (MISCELLANEOUS) ×2 IMPLANT
COVER SURGICAL LIGHT HANDLE (MISCELLANEOUS) ×2 IMPLANT
CUFF TOURNIQUET SINGLE 34IN LL (TOURNIQUET CUFF) ×2 IMPLANT
DRAPE C-ARM 42X72 X-RAY (DRAPES) ×2 IMPLANT
DRAPE C-ARMOR (DRAPES) ×2 IMPLANT
DRAPE HALF SHEET 40X57 (DRAPES) IMPLANT
DRAPE INCISE IOBAN 66X45 STRL (DRAPES) ×2 IMPLANT
DRAPE U-SHAPE 47X51 STRL (DRAPES) ×2 IMPLANT
DRILL BIT 2.5X100 214235007 (MISCELLANEOUS) ×2 IMPLANT
DRILL BIT 2.7X100 214235006 DU (MISCELLANEOUS) ×2 IMPLANT
DRSG ADAPTIC 3X8 NADH LF (GAUZE/BANDAGES/DRESSINGS) ×2 IMPLANT
DRSG MEPITEL 3X4 ME34 (GAUZE/BANDAGES/DRESSINGS) ×2 IMPLANT
DRSG PAD ABDOMINAL 8X10 ST (GAUZE/BANDAGES/DRESSINGS) ×4 IMPLANT
ELECT REM PT RETURN 9FT ADLT (ELECTROSURGICAL) ×2
ELECTRODE REM PT RTRN 9FT ADLT (ELECTROSURGICAL) ×1 IMPLANT
GAUZE SPONGE 4X4 12PLY STRL (GAUZE/BANDAGES/DRESSINGS) ×2 IMPLANT
GLOVE BIO SURGEON STRL SZ7.5 (GLOVE) ×2 IMPLANT
GLOVE BIO SURGEON STRL SZ8 (GLOVE) ×2 IMPLANT
GLOVE BIOGEL PI IND STRL 7.5 (GLOVE) ×1 IMPLANT
GLOVE BIOGEL PI IND STRL 8 (GLOVE) ×1 IMPLANT
GLOVE BIOGEL PI INDICATOR 7.5 (GLOVE) ×1
GLOVE BIOGEL PI INDICATOR 8 (GLOVE) ×1
GOWN STRL REUS W/ TWL LRG LVL3 (GOWN DISPOSABLE) ×2 IMPLANT
GOWN STRL REUS W/ TWL XL LVL3 (GOWN DISPOSABLE) ×1 IMPLANT
GOWN STRL REUS W/TWL LRG LVL3 (GOWN DISPOSABLE) ×2
GOWN STRL REUS W/TWL XL LVL3 (GOWN DISPOSABLE) ×1
IMMOBILIZER KNEE 22 UNIV (SOFTGOODS) ×2 IMPLANT
K-WIRE ACE 1.6X6 (WIRE) ×4
KIT BASIN OR (CUSTOM PROCEDURE TRAY) ×2 IMPLANT
KIT TURNOVER KIT B (KITS) ×2 IMPLANT
KWIRE ACE 1.6X6 (WIRE) ×2 IMPLANT
NDL SUT 6 .5 CRC .975X.05 MAYO (NEEDLE) IMPLANT
NEEDLE MAYO TAPER (NEEDLE)
NS IRRIG 1000ML POUR BTL (IV SOLUTION) ×2 IMPLANT
PACK ORTHO EXTREMITY (CUSTOM PROCEDURE TRAY) ×2 IMPLANT
PAD ARMBOARD 7.5X6 YLW CONV (MISCELLANEOUS) ×4 IMPLANT
PAD CAST 4YDX4 CTTN HI CHSV (CAST SUPPLIES) ×1 IMPLANT
PADDING CAST COTTON 4X4 STRL (CAST SUPPLIES) ×1
PADDING CAST COTTON 6X4 STRL (CAST SUPPLIES) ×2 IMPLANT
PLATE LOCK LG 9H LT PROX TIB (Plate) ×2 IMPLANT
SCREW CORTICAL 3.5MM  34MM (Screw) ×2 IMPLANT
SCREW CORTICAL 3.5MM 34MM (Screw) ×2 IMPLANT
SCREW CORTICAL 3.5MM 38MM (Screw) ×2 IMPLANT
SCREW CORTICAL 3.5MM 40MM (Screw) ×2 IMPLANT
SCREW LOCK CORT STAR 3.5X70 (Screw) ×2 IMPLANT
SCREW LOCK CORT STAR 3.5X80 (Screw) ×2 IMPLANT
SCREW LOCK CORT STAR 3.5X85 (Screw) ×6 IMPLANT
SCREW LOW PROF CORTICAL 3.5X80 (Screw) ×2 IMPLANT
SCREW LP 3.5X90MM (Screw) ×2 IMPLANT
SPONGE LAP 18X18 X RAY DECT (DISPOSABLE) ×2 IMPLANT
STAPLER VISISTAT 35W (STAPLE) ×2 IMPLANT
STOCKINETTE IMPERVIOUS LG (DRAPES) ×2 IMPLANT
SUCTION FRAZIER HANDLE 10FR (MISCELLANEOUS) ×1
SUCTION TUBE FRAZIER 10FR DISP (MISCELLANEOUS) ×1 IMPLANT
SUT ETHILON 3 0 PS 1 (SUTURE) IMPLANT
SUT PROLENE 0 CT 2 (SUTURE) ×4 IMPLANT
SUT VIC AB 0 CT1 27 (SUTURE) ×1
SUT VIC AB 0 CT1 27XBRD ANBCTR (SUTURE) ×1 IMPLANT
SUT VIC AB 1 CT1 27 (SUTURE) ×1
SUT VIC AB 1 CT1 27XBRD ANBCTR (SUTURE) ×1 IMPLANT
SUT VIC AB 2-0 CT1 27 (SUTURE) ×2
SUT VIC AB 2-0 CT1 TAPERPNT 27 (SUTURE) ×2 IMPLANT
TOWEL OR 17X24 6PK STRL BLUE (TOWEL DISPOSABLE) ×2 IMPLANT
TOWEL OR 17X26 10 PK STRL BLUE (TOWEL DISPOSABLE) ×4 IMPLANT
TRAY FOLEY W/METER SILVER 16FR (SET/KITS/TRAYS/PACK) IMPLANT
TUBE CONNECTING 12X1/4 (SUCTIONS) ×2 IMPLANT
WATER STERILE IRR 1000ML POUR (IV SOLUTION) ×4 IMPLANT
YANKAUER SUCT BULB TIP NO VENT (SUCTIONS) ×2 IMPLANT

## 2018-01-15 NOTE — Transfer of Care (Signed)
Immediate Anesthesia Transfer of Care Note  Patient: Timothy [name]  Procedure(s) Performed: OPEN REDUCTION INTERNAL FIXATION (ORIF) TIBIAL PLATEAU (Left )  Patient Location: PACU  Anesthesia Type:General  Level of Consciousness: Awake. Drowsy.  Combative.  Pulling at lines/monitor cables.    Airway & Oxygen Therapy: Patient Spontanous Breathing and Patient connected to nasal cannula oxygen.  RR even and unlabored.    Post-op Assessment: Report given to RN and Post -op Vital signs reviewed and stable  Post vital signs: Reviewed and stable  Last Vitals:  Vitals Value Taken Time  BP    Temp    Pulse 37 01/15/2018  6:09 PM  Resp 18 01/15/2018  6:10 PM  SpO2 97 % 01/15/2018  6:09 PM  Vitals shown include unvalidated device data.  Last Pain:  Vitals:   01/15/18 1014  TempSrc:   PainSc: 9       Patients Stated Pain Goal: 2 (31/59/45 8592)  Complications: No apparent anesthesia complications
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
## 2018-01-15 NOTE — Anesthesia Postprocedure Evaluation (Signed)
Anesthesia Post Note  Patient: Timothy [name]  Procedure(s) Performed: OPEN REDUCTION INTERNAL FIXATION (ORIF) TIBIAL PLATEAU (Left )     Patient location during evaluation: PACU Anesthesia Type: General Level of consciousness: awake and alert Pain management: pain level controlled Vital Signs Assessment: post-procedure vital signs reviewed and stable Respiratory status: spontaneous breathing, nonlabored ventilation, respiratory function stable and patient connected to nasal cannula oxygen Cardiovascular status: blood pressure returned to baseline and stable Postop Assessment: no apparent nausea or vomiting Anesthetic complications: no    Last Vitals:  Vitals:   01/15/18 1843 01/15/18 1845  BP: (!) 149/88   Pulse: 83 81  Resp: 12 12  Temp:    SpO2: 100% 100%    Last Pain:  Vitals:   01/15/18 1843  TempSrc:   PainSc: Asleep                 Lacole Komorowski S
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
## 2018-01-15 NOTE — Anesthesia Procedure Notes (Signed)
Procedure Name: Intubation Date/Time: 01/15/2018 3:54 PM Performed by: Barrington Ellison, CRNA Pre-anesthesia Checklist: Patient identified, Emergency Drugs available, Suction available and Patient being monitored Patient Re-evaluated:Patient Re-evaluated prior to induction Oxygen Delivery Method: Circle System Utilized Preoxygenation: Pre-oxygenation with 100% oxygen Induction Type: IV induction Ventilation: Mask ventilation without difficulty Laryngoscope Size: Mac and 4 Grade View: Grade I Tube type: Oral Tube size: 7.5 mm Number of attempts: 1 Airway Equipment and Method: Stylet and Oral airway Placement Confirmation: ETT inserted through vocal cords under direct vision,  positive ETCO2 and breath sounds checked- equal and bilateral Secured at: 22 cm Tube secured with: Tape Dental Injury: Teeth and Oropharynx as per pre-operative assessment

## 2018-01-15 NOTE — Anesthesia Preprocedure Evaluation (Addendum)
Anesthesia Evaluation  Patient identified by MRN, date of birth, ID band Patient awake    Reviewed: Allergy & Precautions, NPO status , Patient's Chart, lab work & pertinent test results  Airway Mallampati: II  TM Distance: >3 FB Neck ROM: Full    Dental no notable dental hx. (+) Teeth Intact, Poor Dentition, Chipped, Missing, Dental Advisory Given   Pulmonary neg pulmonary ROS, former smoker,    Pulmonary exam normal breath sounds clear to auscultation       Cardiovascular hypertension, Normal cardiovascular exam Rhythm:Regular Rate:Normal     Neuro/Psych negative neurological ROS  negative psych ROS   GI/Hepatic negative GI ROS, (+)     substance abuse  IV drug use,   Endo/Other  negative endocrine ROS  Renal/GU DialysisRenal disease  negative genitourinary   Musculoskeletal negative musculoskeletal ROS (+)   Abdominal   Peds negative pediatric ROS (+)  Hematology negative hematology ROS (+)   Anesthesia Other Findings   Reproductive/Obstetrics negative OB ROS                            Anesthesia Physical Anesthesia Plan  ASA: IV  Anesthesia Plan: General   Post-op Pain Management:    Induction: Intravenous  PONV Risk Score and Plan: 2 and Ondansetron, Dexamethasone and Treatment may vary due to age or medical condition  Airway Management Planned: Oral ETT  Additional Equipment:   Intra-op Plan:   Post-operative Plan: Extubation in OR  Informed Consent: I have reviewed the patients History and Physical, chart, labs and discussed the procedure including the risks, benefits and alternatives for the proposed anesthesia with the patient or authorized representative who has indicated his/her understanding and acceptance.   Dental advisory given  Plan Discussed with: CRNA  Anesthesia Plan Comments:         Anesthesia Quick Evaluation

## 2018-01-16 ENCOUNTER — Encounter (HOSPITAL_COMMUNITY): Payer: Self-pay | Admitting: Orthopedic Surgery

## 2018-01-16 LAB — CBC
HCT: 32.9 % — ABNORMAL LOW (ref 39.0–52.0)
HEMOGLOBIN: 10.6 g/dL — AB (ref 13.0–17.0)
MCH: 30.2 pg (ref 26.0–34.0)
MCHC: 32.2 g/dL (ref 30.0–36.0)
MCV: 93.7 fL (ref 78.0–100.0)
PLATELETS: 337 10*3/uL (ref 150–400)
RBC: 3.51 MIL/uL — AB (ref 4.22–5.81)
RDW: 14.9 % (ref 11.5–15.5)
WBC: 13.4 10*3/uL — AB (ref 4.0–10.5)

## 2018-01-16 MED ORDER — SENNA 8.6 MG PO TABS: 1.0000 | ORAL_TABLET | Freq: Every evening | ORAL | Status: DC | PRN

## 2018-01-16 NOTE — Progress Notes (Signed)
Patient refused blood drawn this am

## 2018-01-16 NOTE — Progress Notes (Signed)
Physical Therapy Wound Treatment Patient Details  Name: Timothy [name] MRN: [id number] Date of Birth: 1961/01/27  Today's Date: 01/16/2018 Time: 1191-4782 Time Calculation (min): 30 min  Subjective  Subjective: It's not feeling too bad right now Patient and Family Stated Goals: heal wound  Date of Onset: 01/13/18 Prior Treatments: surgical clean out and suturing  Pain Score: Pt premedicated with regularly scheduled pain medications prior to treatment. No pain noted during treatment.  Wound Assessment  Wound / Incision (Open or Dehisced) 01/16/18 Laceration Hand Left L hand wound  (Active)  Wound Image   01/16/2018  9:00 AM  Dressing Type Silicone dressing;Compression wrap 01/16/2018  9:00 AM  Dressing Changed Changed 01/16/2018  9:00 AM  Dressing Status Dry;Intact;Clean 01/16/2018  9:00 AM  Dressing Change Frequency Daily 01/16/2018  9:00 AM  Site / Wound Assessment Pink;Pale;Painful;Dry;Other (Comment) 01/16/2018  9:00 AM  % Wound base Red or Granulating 100% 01/16/2018  9:00 AM  Peri-wound Assessment Intact;Erythema (blanchable) 01/16/2018  9:00 AM  Wound Length (cm) 7 cm 01/16/2018  9:00 AM  Wound Width (cm) 7 cm 01/16/2018  9:00 AM  Wound Surface Area (cm^2) 49 cm^2 01/16/2018  9:00 AM  Margins Unattached edges (unapproximated) 01/16/2018  9:00 AM  Closure Sutures 01/16/2018  9:00 AM  Drainage Amount Minimal 01/16/2018  9:00 AM  Drainage Description Serosanguineous;No odor 01/16/2018  9:00 AM  Treatment Hydrotherapy (Pulse lavage) 01/16/2018  9:00 AM      Hydrotherapy Pulsed lavage therapy - wound location: L palm Pulsed Lavage with Suction (psi): 4 psi(4-8 psi) Pulsed Lavage with Suction - Normal Saline Used: 1000 mL Pulsed Lavage Tip: Tip with splash shield   Wound Assessment and Plan  Wound Therapy - Assess/Plan/Recommendations Wound Therapy - Clinical Statement: Left palm wound consists of a 7x7 cm area where epidermis was removed with a 4 cm laceration which reportedly had purlent drainage that was  cleaned out in OR and closed with one suture. Wound will benefit from daily pulsed lavage for removal of purlent drainage and cleansing of wound for improved healing. Factors Delaying/Impairing Wound Healing: Substance abuse;Immobility Hydrotherapy Plan: Dressing change;Pulsatile lavage with suction;Patient/family education Wound Therapy - Frequency: 6X / week Wound Plan: see above   Wound Therapy Goals- Improve the function of patient's integumentary system by progressing the wound(s) through the phases of wound healing (inflammation - proliferation - remodeling) by: Improve Drainage Characteristics: Min;Other (comment)(not purulent) Patient/Family will be able to : dress wound and keep clean Additional Wound Therapy Goal: closure of deep wound Goals/treatment plan/discharge plan were made with and agreed upon by patient/family: Yes Time For Goal Achievement: 7 days Wound Therapy - Potential for Goals: Fair  Goals will be updated until maximal potential achieved or discharge criteria met.  Discharge criteria: when goals achieved, discharge from hospital, MD decision/surgical intervention, no progress towards goals, refusal/missing three consecutive treatments without notification or medical reason.  GP    Dani Gobble. Migdalia Dk PT, DPT Acute Rehabilitation  [phone] Pager [phone]   [place] 01/16/2018, 10:12 AM
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
## 2018-01-16 NOTE — Progress Notes (Signed)
Pt has had poor eating habits, contributing to his decreased bowel movements, in talking with the patient when asked why he hasn't been eating,his reply was " because I don't like anything they bring" I educated the patient to the variety on the menu to choose from and he admitted he cannot read. After reading him the menu he was able to make a selection and I called to nutritional services. Pt was very happy.

## 2018-01-16 NOTE — Progress Notes (Addendum)
[facility] Progress Note   Subjective:  Seen in room. Denies CP/SOB. S/p ORIF L tibia yesterday, wrapped today. Needed L hand wound irrigation as well. Only concern today is mild burning with urination which has been ongoing, but he is concerned about it. Afebrile.   Objective Vitals:   01/15/18 1927 01/15/18 2007 01/16/18 0029 01/16/18 0600  BP: (!) 155/81 (!) 142/86 132/80   Pulse: 80 80 70   Resp: 18 18 18    Temp: 97.7 F (36.5 C) 97.9 F (36.6 C) 98 F (36.7 C)   TempSrc: Oral Oral Oral   SpO2: 100% 100% 98%   Weight:    75.4 kg (166 lb 3.6 oz)  Height:       Physical Exam General: Well appearing, NAD Heart: RRR; no murmur Lungs: CTA anteriorly Extremities: L knee wrapped. No LE edema. Dialysis Access: LUE AVF + bruit  Additional Objective Labs: Basic Metabolic Panel: Recent Labs  Lab 01/13/18 1249 01/14/18 0504 01/15/18 0533  NA 135 134* 135  K 4.9 4.5 4.1  CL 96* 94* 93*  CO2 27 26 26   GLUCOSE 93 101* 97  BUN 39* 49* 28*  CREATININE 9.04* 10.63* 7.76*  CALCIUM 8.8* 9.1 8.8*   Liver Function Tests: Recent Labs  Lab 01/13/18 1249  AST 38  ALT 29  ALKPHOS 47  BILITOT 0.9  PROT 6.6  ALBUMIN 3.2*   CBC: Recent Labs  Lab 01/13/18 1249 01/14/18 0504 01/15/18 0533 01/16/18 0836  WBC 8.5 8.5 11.9* 13.4*  NEUTROABS 6.3  --   --   --   HGB 10.8* 11.2* 11.6* 10.6*  HCT 33.8* 34.0* 35.7* 32.9*  MCV 94.7 94.4 94.2 93.7  PLT 268 293 283 337   Studies/Results: Dg Knee Complete 4 Views Left  Result Date: 01/15/2018 CLINICAL DATA:  Tibial plateau fixation EXAM: DG C-ARM 61-120 MIN; LEFT KNEE - COMPLETE 4+ VIEW COMPARISON:  01/13/2018 FLUOROSCOPY TIME:  Fluoroscopy Time:  38 seconds Radiation Exposure Index (if provided by the fluoroscopic device): Not available Number of Acquired Spot Images: 5 FINDINGS: Fixation sideplate is noted along the lateral aspect of the proximal tibia with multiple fixation screws. Fracture fragments are in near  anatomic alignment. IMPRESSION: ORIF of proximal left tibial fracture Electronically Signed   By: Inez Catalina M.D.   On: 01/15/2018 18:03   Dg Knee Left Port  Result Date: 01/15/2018 CLINICAL DATA:  Status post left tibial ORIF EXAM: PORTABLE LEFT KNEE - 1-2 VIEW COMPARISON:  None. FINDINGS: Fixation sideplate is noted along proximal left tibia with multiple fixation screws. The fracture fragments are in near anatomic alignment. IMPRESSION: Status post ORIF of proximal tibial fracture. Electronically Signed   By: Inez Catalina M.D.   On: 01/15/2018 19:16   Dg C-arm 1-60 Min  Result Date: 01/15/2018 CLINICAL DATA:  Tibial plateau fixation EXAM: DG C-ARM 61-120 MIN; LEFT KNEE - COMPLETE 4+ VIEW COMPARISON:  01/13/2018 FLUOROSCOPY TIME:  Fluoroscopy Time:  38 seconds Radiation Exposure Index (if provided by the fluoroscopic device): Not available Number of Acquired Spot Images: 5 FINDINGS: Fixation sideplate is noted along the lateral aspect of the proximal tibia with multiple fixation screws. Fracture fragments are in near anatomic alignment. IMPRESSION: ORIF of proximal left tibial fracture Electronically Signed   By: Inez Catalina M.D.   On: 01/15/2018 18:03   Medications: . sodium chloride 10 mL/hr at 01/16/18 0347  .  ceFAZolin (ANCEF) IV 1 g (01/16/18 1015)   . amLODipine  10 mg Oral  Daily  . bacitracin   Topical Daily  . calcium acetate  1,334 mg Oral TID WC  . diltiazem  360 mg Oral Daily  . docusate sodium  100 mg Oral BID  . enoxaparin (LOVENOX) injection  30 mg Subcutaneous Q24H  . HYDROcodone-acetaminophen  1-2 tablet Oral Q6H  . methocarbamol  750 mg Oral QID    Dialysis Orders: TTS at [facility], 400/800, EDW 68.5kg, 2K/2Ca, UF profile 2, AVF, heparin 6800 bolus - Mircera 60ncg IV q 2 weeks (last 3/28) - Venofer 50mg  IV weekly - Calcitriol 0.56mcg PO q HD  Assessment/Plan: 1. L tibial fracture: S/p ORIF 4/2. Per ortho. Will be non-weight bearing for 6-8  weeks. 2. ESRD: Usually TTS schedule. S/p HD 4/1 to avoid sched conflict, next HD 4/4. No heparin. 3. Hypertension/volume: BP reasonable.Weight up, but also cast. No symptoms of overload. 4. Anemia: Hgb 10.6, trending. Not due for ESA yet, but could give extra dose prn. 5. Metabolic bone disease: Ca ok, continue home binder ([employer]). Add VDRA with next HD. 6. L hand laceration:S/p wash out in ED, was purulent. Per primary. 7.  ?Dysuria: Will check urinalysis.   Veneta Penton, PA-C 01/16/2018, 10:22 AM  [facility] Pager: [phone]  Pt seen, examined and agree w A/P as above.  Kelly Splinter MD [employer] pager [phone]   01/16/2018, 11:35 AM
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
## 2018-01-16 NOTE — Plan of Care (Signed)
  Problem: Nutrition: Goal: Adequate nutrition will be maintained Outcome: Progressing   Problem: Elimination: Goal: Will not experience complications related to bowel motility Outcome: Progressing   Problem: Pain Managment: Goal: General experience of comfort will improve Outcome: Progressing   Problem: Safety: Goal: Ability to remain free from injury will improve Outcome: Progressing   

## 2018-01-17 DIAGNOSIS — Z992 Dependence on renal dialysis: Secondary | ICD-10-CM

## 2018-01-17 DIAGNOSIS — S82192A Other fracture of upper end of left tibia, initial encounter for closed fracture: Secondary | ICD-10-CM

## 2018-01-17 DIAGNOSIS — N186 End stage renal disease: Secondary | ICD-10-CM

## 2018-01-17 DIAGNOSIS — I10 Essential (primary) hypertension: Secondary | ICD-10-CM

## 2018-01-17 DIAGNOSIS — W19XXXA Unspecified fall, initial encounter: Secondary | ICD-10-CM

## 2018-01-17 DIAGNOSIS — S82102A Unspecified fracture of upper end of left tibia, initial encounter for closed fracture: Secondary | ICD-10-CM

## 2018-01-17 LAB — URINALYSIS, ROUTINE W REFLEX MICROSCOPIC
BILIRUBIN URINE: NEGATIVE
Glucose, UA: NEGATIVE mg/dL
Ketones, ur: NEGATIVE mg/dL
Leukocytes, UA: NEGATIVE
NITRITE: NEGATIVE
Protein, ur: 100 mg/dL — AB
SPECIFIC GRAVITY, URINE: 1.012 (ref 1.005–1.030)
pH: 8 (ref 5.0–8.0)

## 2018-01-17 LAB — CBC
HCT: 28.2 % — ABNORMAL LOW (ref 39.0–52.0)
HEMOGLOBIN: 9.5 g/dL — AB (ref 13.0–17.0)
MCH: 30.8 pg (ref 26.0–34.0)
MCHC: 33.7 g/dL (ref 30.0–36.0)
MCV: 91.6 fL (ref 78.0–100.0)
PLATELETS: 337 10*3/uL (ref 150–400)
RBC: 3.08 MIL/uL — ABNORMAL LOW (ref 4.22–5.81)
RDW: 14.6 % (ref 11.5–15.5)
WBC: 12.6 10*3/uL — ABNORMAL HIGH (ref 4.0–10.5)

## 2018-01-17 LAB — RAPID URINE DRUG SCREEN, HOSP PERFORMED
AMPHETAMINES: NOT DETECTED
Barbiturates: NOT DETECTED
Benzodiazepines: POSITIVE — AB
COCAINE: NOT DETECTED
OPIATES: POSITIVE — AB
TETRAHYDROCANNABINOL: POSITIVE — AB

## 2018-01-17 LAB — RENAL FUNCTION PANEL
ALBUMIN: 2.6 g/dL — AB (ref 3.5–5.0)
ANION GAP: 15 (ref 5–15)
BUN: 78 mg/dL — AB (ref 6–20)
CALCIUM: 8.6 mg/dL — AB (ref 8.9–10.3)
CO2: 23 mmol/L (ref 22–32)
Chloride: 90 mmol/L — ABNORMAL LOW (ref 101–111)
Creatinine, Ser: 12.23 mg/dL — ABNORMAL HIGH (ref 0.61–1.24)
GFR calc Af Amer: 5 mL/min — ABNORMAL LOW (ref >60)
GFR calc non Af Amer: 4 mL/min — ABNORMAL LOW (ref >60)
Glucose, Bld: 129 mg/dL — ABNORMAL HIGH (ref 65–99)
PHOSPHORUS: 4.5 mg/dL (ref 2.5–4.6)
Potassium: 4.1 mmol/L (ref 3.5–5.1)
SODIUM: 128 mmol/L — AB (ref 135–145)

## 2018-01-17 MED ORDER — LIDOCAINE HCL (PF) 1 % IJ SOLN: 5.0000 mL | INTRAMUSCULAR | Status: DC | PRN

## 2018-01-17 MED ORDER — AMOXICILLIN-POT CLAVULANATE 500-125 MG PO TABS
1.0000 | ORAL_TABLET | Freq: Every day | ORAL | Status: DC
  Administered 2018-01-17: 500 mg via ORAL
  Filled 2018-01-17 (×2): qty 1

## 2018-01-17 MED ORDER — ALTEPLASE 2 MG IJ SOLR
2.0000 mg | Freq: Once | INTRAMUSCULAR | Status: DC | PRN
  Filled 2018-01-17: qty 2

## 2018-01-17 MED ORDER — SODIUM CHLORIDE 0.9 % IV SOLN: 100.0000 mL | INTRAVENOUS | Status: DC | PRN

## 2018-01-17 MED ORDER — PENTAFLUOROPROP-TETRAFLUOROETH EX AERO: 1.0000 "application " | INHALATION_SPRAY | CUTANEOUS | Status: DC | PRN

## 2018-01-17 MED ORDER — HEPARIN SODIUM (PORCINE) 1000 UNIT/ML DIALYSIS
1000.0000 [IU] | INTRAMUSCULAR | Status: DC | PRN
  Filled 2018-01-17: qty 1

## 2018-01-17 MED ORDER — LIDOCAINE-PRILOCAINE 2.5-2.5 % EX CREA
1.0000 "application " | TOPICAL_CREAM | CUTANEOUS | Status: DC | PRN
  Filled 2018-01-17: qty 5

## 2018-01-17 NOTE — Plan of Care (Signed)
  Problem: Nutrition: Goal: Adequate nutrition will be maintained Outcome: Progressing   Problem: Coping: Goal: Level of anxiety will decrease Outcome: Progressing   Problem: Pain Managment: Goal: General experience of comfort will improve Outcome: Progressing   Problem: Safety: Goal: Ability to remain free from injury will improve Outcome: Progressing   
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
## 2018-01-17 NOTE — Progress Notes (Signed)
Physical Therapy Wound Treatment Patient Details  Name: Timothy [name] MRN: [id number] Date of Birth: 03-11-1961  Today's Date: 01/17/2018 Time: 4035-2481 Time Calculation (min): 16 min  Subjective  Subjective: I'm ready to go home Patient and Family Stated Goals: heal wound  Date of Onset: 01/13/18 Prior Treatments: surgical clean out and suturing  Pain Score: Pt premedicated prior to treatment. Pt with 6/10 Facial Pain score during treatment. No complaints of pain post treatment.  Wound Assessment  Wound / Incision (Open or Dehisced) 01/16/18 Laceration Hand Left L hand wound  (Active)  Wound Image   01/16/2018  9:00 AM  Dressing Type Silicone dressing;Compression wrap 01/17/2018  6:00 PM  Dressing Changed Changed 01/17/2018  6:00 PM  Dressing Status Dry;Intact;Clean 01/17/2018  6:00 PM  Dressing Change Frequency Daily 01/17/2018  6:00 PM  Site / Wound Assessment Pink;Pale;Painful;Other (Comment);Red 01/17/2018  6:00 PM  % Wound base Red or Granulating 100% 01/17/2018  6:00 PM  Peri-wound Assessment Intact;Erythema (blanchable) 01/17/2018  6:00 PM  Wound Length (cm) 7 cm 01/16/2018  9:00 AM  Wound Width (cm) 7 cm 01/16/2018  9:00 AM  Wound Surface Area (cm^2) 49 cm^2 01/16/2018  9:00 AM  Margins Unattached edges (unapproximated) 01/17/2018  6:00 PM  Closure Sutures 01/17/2018  6:00 PM  Drainage Amount Minimal 01/17/2018  6:00 PM  Drainage Description Serosanguineous;Purulent;No odor 01/17/2018  6:00 PM  Treatment Hydrotherapy (Pulse lavage) 01/17/2018  6:00 PM      Hydrotherapy Pulsed lavage therapy - wound location: L palm Pulsed Lavage with Suction (psi): 4 psi(4-8 psi) Pulsed Lavage with Suction - Normal Saline Used: 1000 mL Pulsed Lavage Tip: Tip with splash shield   Wound Assessment and Plan  Wound Therapy - Assess/Plan/Recommendations Wound Therapy - Clinical Statement: Pt with purlent drainage coming from deep laceration in wound. Continue to perform pulsed lavage to clean wound and remove purlent  drainage and contaminants from wound for wound healing.  Factors Delaying/Impairing Wound Healing: Substance abuse;Immobility Hydrotherapy Plan: Dressing change;Pulsatile lavage with suction;Patient/family education Wound Therapy - Frequency: 6X / week Wound Therapy - Follow Up Recommendations: Other (comment)(pt care) Wound Plan: see above   Wound Therapy Goals- Improve the function of patient's integumentary system by progressing the wound(s) through the phases of wound healing (inflammation - proliferation - remodeling) by: Improve Drainage Characteristics: Min;Other (comment)(not purulent) Patient/Family will be able to : dress wound and keep clean Additional Wound Therapy Goal: closure of deep wound Goals/treatment plan/discharge plan were made with and agreed upon by patient/family: Yes Time For Goal Achievement: 7 days Wound Therapy - Potential for Goals: Fair  Goals will be updated until maximal potential achieved or discharge criteria met.  Discharge criteria: when goals achieved, discharge from hospital, MD decision/surgical intervention, no progress towards goals, refusal/missing three consecutive treatments without notification or medical reason.  GP    Dani Gobble. Migdalia Dk PT, DPT Acute Rehabilitation  [phone] Pager [phone]   [place] 01/17/2018, 6:08 PM

## 2018-01-17 NOTE — Progress Notes (Signed)
[facility] Progress Note   Subjective:  No new c/o's.   Objective Vitals:   01/17/18 0900 01/17/18 0930 01/17/18 0945 01/17/18 1000  BP: 134/80 135/65 (!) 149/83 135/77  Pulse: 61 61 78 67  Resp:      Temp:      TempSrc:      SpO2:      Weight:      Height:       Physical Exam General: Well appearing, NAD Heart: RRR; no murmur Lungs: CTA anteriorly Extremities: L knee wrapped. No LE edema. Dialysis Access: LUE AVF + bruit  Additional Objective Labs: Basic Metabolic Panel: Recent Labs  Lab 01/14/18 0504 01/15/18 0533 01/17/18 0800  NA 134* 135 128*  K 4.5 4.1 4.1  CL 94* 93* 90*  CO2 26 26 23   GLUCOSE 101* 97 129*  BUN 49* 28* 78*  CREATININE 10.63* 7.76* 12.23*  CALCIUM 9.1 8.8* 8.6*  PHOS  --   --  4.5   Liver Function Tests: Recent Labs  Lab 01/13/18 1249 01/17/18 0800  AST 38  --   ALT 29  --   ALKPHOS 47  --   BILITOT 0.9  --   PROT 6.6  --   ALBUMIN 3.2* 2.6*   CBC: Recent Labs  Lab 01/13/18 1249 01/14/18 0504 01/15/18 0533 01/16/18 0836 01/17/18 0800  WBC 8.5 8.5 11.9* 13.4* 12.6*  NEUTROABS 6.3  --   --   --   --   HGB 10.8* 11.2* 11.6* 10.6* 9.5*  HCT 33.8* 34.0* 35.7* 32.9* 28.2*  MCV 94.7 94.4 94.2 93.7 91.6  PLT 268 293 283 337 337   Studies/Results: Dg Knee Complete 4 Views Left  Result Date: 01/15/2018 CLINICAL DATA:  Tibial plateau fixation EXAM: DG C-ARM 61-120 MIN; LEFT KNEE - COMPLETE 4+ VIEW COMPARISON:  01/13/2018 FLUOROSCOPY TIME:  Fluoroscopy Time:  38 seconds Radiation Exposure Index (if provided by the fluoroscopic device): Not available Number of Acquired Spot Images: 5 FINDINGS: Fixation sideplate is noted along the lateral aspect of the proximal tibia with multiple fixation screws. Fracture fragments are in near anatomic alignment. IMPRESSION: ORIF of proximal left tibial fracture Electronically Signed   By: Inez Catalina M.D.   On: 01/15/2018 18:03   Dg Knee Left Port  Result Date:  01/15/2018 CLINICAL DATA:  Status post left tibial ORIF EXAM: PORTABLE LEFT KNEE - 1-2 VIEW COMPARISON:  None. FINDINGS: Fixation sideplate is noted along proximal left tibia with multiple fixation screws. The fracture fragments are in near anatomic alignment. IMPRESSION: Status post ORIF of proximal tibial fracture. Electronically Signed   By: Inez Catalina M.D.   On: 01/15/2018 19:16   Dg C-arm 1-60 Min  Result Date: 01/15/2018 CLINICAL DATA:  Tibial plateau fixation EXAM: DG C-ARM 61-120 MIN; LEFT KNEE - COMPLETE 4+ VIEW COMPARISON:  01/13/2018 FLUOROSCOPY TIME:  Fluoroscopy Time:  38 seconds Radiation Exposure Index (if provided by the fluoroscopic device): Not available Number of Acquired Spot Images: 5 FINDINGS: Fixation sideplate is noted along the lateral aspect of the proximal tibia with multiple fixation screws. Fracture fragments are in near anatomic alignment. IMPRESSION: ORIF of proximal left tibial fracture Electronically Signed   By: Inez Catalina M.D.   On: 01/15/2018 18:03   Medications: . sodium chloride 10 mL/hr at 01/16/18 1545  . sodium chloride    . sodium chloride     . amLODipine  10 mg Oral Daily  . bacitracin   Topical Daily  . calcium  acetate  1,334 mg Oral TID WC  . diltiazem  360 mg Oral Daily  . docusate sodium  100 mg Oral BID  . enoxaparin (LOVENOX) injection  30 mg Subcutaneous Q24H  . HYDROcodone-acetaminophen  1-2 tablet Oral Q6H  . methocarbamol  750 mg Oral QID    Dialysis Orders: TTS at [facility], 400/800, EDW 68.5kg, 2K/2Ca, UF profile 2, AVF, heparin 6800 bolus - Mircera 60ncg IV q 2 weeks (last 3/28) - Venofer 50mg  IV weekly - Calcitriol 0.64mcg PO q HD  Assessment/Plan: 1. L tibial fracture: S/p ORIF 4/2. Per ortho. Will be non-weight bearing for 6-8 weeks. 2. ESRD: Usually TTS schedule. HD today , no heparin for 1 wk postop. 3. HTN/ vol: both are stable 4. Anemia: Hgb 10.6, trending. Not due for ESA yet, but could give extra dose  prn. 5. Metabolic bone disease: Ca ok, continue home binder ([employer]os[employer]). Add VDRA with next HD. 6. L hand laceration: S/p wash out in ED, was purulent. Per primary    Kelly Splinter MD [place] pager [phone]   01/17/2018, 10:22 AM
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
## 2018-01-18 DIAGNOSIS — S61402A Unspecified open wound of left hand, initial encounter: Secondary | ICD-10-CM | POA: Diagnosis present

## 2018-01-18 DIAGNOSIS — S61412A Laceration without foreign body of left hand, initial encounter: Secondary | ICD-10-CM | POA: Insufficient documentation

## 2018-01-18 DIAGNOSIS — F1110 Opioid abuse, uncomplicated: Secondary | ICD-10-CM

## 2018-01-18 DIAGNOSIS — S82142A Displaced bicondylar fracture of left tibia, initial encounter for closed fracture: Principal | ICD-10-CM

## 2018-01-18 LAB — HCV RNA QUANT RFLX ULTRA OR GENOTYP
HCV RNA Qnt(log copy/mL): 6.561 log10 IU/mL
HepC Qn: 3640000 IU/mL

## 2018-01-18 LAB — HEPATITIS C GENOTYPE

## 2018-01-18 MED ORDER — AMOXICILLIN-POT CLAVULANATE 500-125 MG PO TABS: 1.0000 | ORAL_TABLET | Freq: Every day | ORAL | 0 refills | Status: DC

## 2018-01-18 MED ORDER — BACITRACIN ZINC 500 UNIT/GM EX OINT: TOPICAL_OINTMENT | Freq: Every day | CUTANEOUS | 0 refills | Status: DC

## 2018-01-18 MED ORDER — HYDROCODONE-ACETAMINOPHEN 5-325 MG PO TABS: 1.0000 | ORAL_TABLET | Freq: Three times a day (TID) | ORAL | 0 refills | Status: AC | PRN

## 2018-01-18 MED ORDER — ASPIRIN EC 325 MG PO TBEC: 325.0000 mg | DELAYED_RELEASE_TABLET | Freq: Every day | ORAL | 0 refills | Status: DC

## 2018-01-18 NOTE — Progress Notes (Signed)
Physical Therapy Wound Treatment Patient Details  Name: Timothy [name] MRN: [id number] Date of Birth: 18-Nov-1960  Today's Date: 01/18/2018 Time: 3734-2876 Time Calculation (min): 19 min  Subjective  Subjective: I have 4 daughters one of them will help me with my dressing' Patient and Family Stated Goals: heal wound  Date of Onset: 01/13/18 Prior Treatments: surgical clean out and suturing  Pain Score:  Pt premedicated prior to treatment. Facial pain score with pulsed lavage 4/10. Pt with no c/o pain post treatment.  Wound Assessment  Wound / Incision (Open or Dehisced) 01/16/18 Laceration Hand Left L hand wound  (Active)  Wound Image   01/16/2018  9:00 AM  Dressing Type Compression wrap;Moist to dry 01/18/2018 10:00 AM  Dressing Changed Changed 01/18/2018 10:00 AM  Dressing Status Dry;Intact;Clean 01/18/2018 10:00 AM  Dressing Change Frequency Daily 01/18/2018 10:00 AM  Site / Wound Assessment Pink;Other (Comment);Red;Painful 01/18/2018 10:00 AM  % Wound base Red or Granulating 100% 01/18/2018 10:00 AM  Peri-wound Assessment Intact;Erythema (blanchable) 01/18/2018 10:00 AM  Wound Length (cm) 7 cm 01/16/2018  9:00 AM  Wound Width (cm) 7 cm 01/16/2018  9:00 AM  Wound Surface Area (cm^2) 49 cm^2 01/16/2018  9:00 AM  Margins Attached edges (approximated) 01/18/2018 10:00 AM  Closure Sutures 01/18/2018 10:00 AM  Drainage Amount Minimal 01/18/2018 10:00 AM  Drainage Description No odor;Serous 01/18/2018 10:00 AM  Treatment Hydrotherapy (Pulse lavage);Packing (Saline gauze) 01/18/2018 10:00 AM     Incision (Closed) 01/15/18 Leg Left (Active)  Dressing Type Compression wrap 01/17/2018  8:00 PM  Dressing Clean;Dry;Intact 01/17/2018  8:00 PM  Site / Wound Assessment Dressing in place / Unable to assess 01/17/2018  8:00 PM  Drainage Amount None 01/17/2018  8:00 PM   Hydrotherapy Pulsed lavage therapy - wound location: L palm Pulsed Lavage with Suction (psi): 4 psi(4-8 psi) Pulsed Lavage with Suction - Normal Saline Used:  1000 mL Pulsed Lavage Tip: Tip with splash shield   Wound Assessment and Plan  Wound Therapy - Assess/Plan/Recommendations Wound Therapy - Clinical Statement: Pt with minimal serous drainage, edges of laceration are approximating and epidermis is healing in the greater wound bed. Pt educated on moist to dry dressings and compression wrapping. Pt scheduled to d/c today. PT will follow up tomorrow if pt does not d/c. Factors Delaying/Impairing Wound Healing: Substance abuse;Immobility Hydrotherapy Plan: Dressing change;Pulsatile lavage with suction;Patient/family education Wound Therapy - Frequency: 6X / week Wound Therapy - Follow Up Recommendations: Other (comment)(pt care) Wound Plan: see above   Wound Therapy Goals- Improve the function of patient's integumentary system by progressing the wound(s) through the phases of wound healing (inflammation - proliferation - remodeling) by: Improve Drainage Characteristics: Min;Other (comment)(not purulent) Improve Drainage Characteristics - Progress: Met Patient/Family will be able to : dress wound and keep clean Patient/Family Instruction Goal - Progress: Progressing toward goal Additional Wound Therapy Goal: closure of deep wound Additional Wound Therapy Goal - Progress: Partly met Goals/treatment plan/discharge plan were made with and agreed upon by patient/family: Yes Time For Goal Achievement: 7 days Wound Therapy - Potential for Goals: Fair  Goals will be updated until maximal potential achieved or discharge criteria met.  Discharge criteria: when goals achieved, discharge from hospital, MD decision/surgical intervention, no progress towards goals, refusal/missing three consecutive treatments without notification or medical reason.  GP    Dani Gobble. Migdalia Dk PT, DPT Acute Rehabilitation  [phone] Pager [phone]   [place] 01/18/2018, 10:07 AM

## 2018-01-18 NOTE — Care Management Important Message (Signed)
Important Message  Patient Details  Name: Timothy [name] MRN: [id number] Date of Birth: 11/29/1960   Medicare Important Message Given:  Yes    Shakaya Bhullar 01/18/2018, 1:49 PM
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
## 2018-01-18 NOTE — Discharge Instructions (Signed)
Orthopaedic Trauma Service Discharge Instructions   General Discharge Instructions  WEIGHT BEARING STATUS: Nonweightbearing Left Leg  RANGE OF MOTION/ACTIVITY: unrestricted range of motion left knee. Activity as tolerated while maintaining weightbearing restrictions   Wound Care: ok to leave wounds on L leg open to air. Ok to shower and clean wounds with soap and water. Clean L hand wound at a minimum of 2x a day with soap and water and do wet to dry dressings as instructed     DVT/PE prophylaxis: aspirin 325 mg daily x 4 weeks   Diet: as you were eating previously.  Can use over the counter stool softeners and bowel preparations, such as Miralax, to help with bowel movements.  Narcotics can be constipating.  Be sure to drink plenty of fluids  PAIN MEDICATION USE AND EXPECTATIONS  You have likely been given narcotic medications to help control your pain.  After a traumatic event that results in an fracture (broken bone) with or without surgery, it is ok to use narcotic pain medications to help control one's pain.  We understand that everyone responds to pain differently and each individual patient will be evaluated on a regular basis for the continued need for narcotic medications. Ideally, narcotic medication use should last no more than 6-8 weeks (coinciding with fracture healing).   As a patient it is your responsibility as well to monitor narcotic medication use and report the amount and frequency you use these medications when you come to your office visit.   We would also advise that if you are using narcotic medications, you should take a dose prior to therapy to maximize you participation.  IF YOU ARE ON NARCOTIC MEDICATIONS IT IS NOT PERMISSIBLE TO OPERATE A MOTOR VEHICLE (MOTORCYCLE/CAR/TRUCK/MOPED) OR HEAVY MACHINERY DO NOT MIX NARCOTICS WITH OTHER CNS (CENTRAL NERVOUS SYSTEM) DEPRESSANTS SUCH AS ALCOHOL   STOP SMOKING OR USING NICOTINE PRODUCTS!!!!  As discussed nicotine  severely impairs your body's ability to heal surgical and traumatic wounds but also impairs bone healing.  Wounds and bone heal by forming microscopic blood vessels (angiogenesis) and nicotine is a vasoconstrictor (essentially, shrinks blood vessels).  Therefore, if vasoconstriction occurs to these microscopic blood vessels they essentially disappear and are unable to deliver necessary nutrients to the healing tissue.  This is one modifiable factor that you can do to dramatically increase your chances of healing your injury.    (This means no smoking, no nicotine gum, patches, etc)  DO NOT USE NONSTEROIDAL ANTI-INFLAMMATORY DRUGS (NSAID'S)  Using products such as Advil (ibuprofen), Aleve (naproxen), Motrin (ibuprofen) for additional pain control during fracture healing can delay and/or prevent the healing response.  If you would like to take over the counter (OTC) medication, Tylenol (acetaminophen) is ok.  However, some narcotic medications that are given for pain control contain acetaminophen as well. Therefore, you should not exceed more than 4000 mg of tylenol in a day if you do not have liver disease.  Also note that there are may OTC medicines, such as cold medicines and allergy medicines that my contain tylenol as well.  If you have any questions about medications and/or interactions please ask your doctor/PA or your pharmacist.      ICE AND ELEVATE INJURED/OPERATIVE EXTREMITY  Using ice and elevating the injured extremity above your heart can help with swelling and pain control.  Icing in a pulsatile fashion, such as 20 minutes on and 20 minutes off, can be followed.    Do not place ice directly on skin.  Make sure there is a barrier between to skin and the ice pack.    Using frozen items such as frozen peas works well as the conform nicely to the are that needs to be iced.  USE AN ACE WRAP OR TED HOSE FOR SWELLING CONTROL  In addition to icing and elevation, Ace wraps or TED hose are used to  help limit and resolve swelling.  It is recommended to use Ace wraps or TED hose until you are informed to stop.    When using Ace Wraps start the wrapping distally (farthest away from the body) and wrap proximally (closer to the body)   Example: If you had surgery on your leg or thing and you do not have a splint on, start the ace wrap at the toes and work your way up to the thigh        If you had surgery on your upper extremity and do not have a splint on, start the ace wrap at your fingers and work your way up to the upper arm  IF YOU ARE IN A SPLINT OR CAST DO NOT [place]   If your splint gets wet for any reason please contact the office immediately. You may shower in your splint or cast as long as you keep it dry.  This can be done by wrapping in a cast cover or garbage back (or similar)  Do Not stick any thing down your splint or cast such as pencils, money, or hangers to try and scratch yourself with.  If you feel itchy take benadryl as prescribed on the bottle for itching  IF YOU ARE IN A CAM BOOT (BLACK BOOT)  You may remove boot periodically. Perform daily dressing changes as noted below.  Wash the liner of the boot regularly and wear a sock when wearing the boot. It is recommended that you sleep in the boot until told otherwise  CALL THE OFFICE WITH ANY QUESTIONS OR CONCERNS: [phone]

## 2018-01-21 ENCOUNTER — Ambulatory Visit: Payer: Medicare Other | Admitting: Physical Therapy

## 2018-01-29 ENCOUNTER — Ambulatory Visit: Payer: Medicare Other | Attending: Orthopedic Surgery

## 2018-01-29 ENCOUNTER — Other Ambulatory Visit: Payer: Self-pay

## 2018-01-29 DIAGNOSIS — M6281 Muscle weakness (generalized): Secondary | ICD-10-CM

## 2018-01-29 DIAGNOSIS — R262 Difficulty in walking, not elsewhere classified: Secondary | ICD-10-CM | POA: Diagnosis present

## 2018-01-29 DIAGNOSIS — M25562 Pain in left knee: Secondary | ICD-10-CM

## 2018-01-29 DIAGNOSIS — M25662 Stiffness of left knee, not elsewhere classified: Secondary | ICD-10-CM | POA: Diagnosis present
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
## 2018-01-29 NOTE — Patient Instructions (Signed)
QS and ankle DF x 100 reps per day LT leg

## 2018-02-08 ENCOUNTER — Ambulatory Visit: Payer: Medicare Other

## 2018-02-08 DIAGNOSIS — M25662 Stiffness of left knee, not elsewhere classified: Secondary | ICD-10-CM

## 2018-02-08 DIAGNOSIS — R262 Difficulty in walking, not elsewhere classified: Secondary | ICD-10-CM

## 2018-02-08 DIAGNOSIS — M25562 Pain in left knee: Secondary | ICD-10-CM

## 2018-02-08 DIAGNOSIS — M6281 Muscle weakness (generalized): Secondary | ICD-10-CM

## 2018-02-08 NOTE — Therapy (Signed)
[place], [place], [address] Phone: [phone]   Fax:  [phone]  Physical Therapy Treatment  Patient Details  Name: Timothy [name] MRN: [id number] Date of Birth: 12/08/1960 Referring Provider: Crawford Givens MD   Encounter Date: 02/08/2018  PT End of Session - 02/08/18 0837    Visit Number  2    Number of Visits  25    Date for PT Re-Evaluation  04/19/18    Authorization Type  MCR/MCD    Authorization Time Period  KX visit 15    PT Start Time  0835    PT Stop Time  0914    PT Time Calculation (min)  39 min    Activity Tolerance  Patient tolerated treatment well    Behavior During Therapy  [facility] for tasks assessed/performed       Past Medical History:  Diagnosis Date  . Acute diastolic heart failure ([place]) 07/17/2016  . BPH (benign prostatic hyperplasia)   . Chronic kidney disease   . Colon cancer ([place]) 2014  . Elevated troponin   . Hypertension     Past Surgical History:  Procedure Laterality Date  . ABDOMINAL SURGERY    . AV FISTULA PLACEMENT Left 09/19/2016   Procedure: Left arm Radiocephalic ARTERIOVENOUS (AV) FISTULA CREATION;  Surgeon: Conrad [place], MD;  Location: [place];  Service: Vascular;  Laterality: Left;  . BASCILIC VEIN TRANSPOSITION Left 07/09/2017   Procedure: BRACHIOCEPHALIC FISTULA CREATION;  Surgeon: Conrad [place], MD;  Location: [place];  Service: Vascular;  Laterality: Left;  . COLON SURGERY  2014  . INSERTION OF DIALYSIS CATHETER N/A 09/19/2016   Procedure: INSERTION OF TUNNELED DIALYSIS CATHETER;  Surgeon: Conrad Haring, MD;  Location: [place];  Service: Vascular;  Laterality: N/A;  . IR GENERIC HISTORICAL  09/14/2016   IR US GUIDE VASC ACCESS RIGHT 09/14/2016 [name], DO [facility]  . IR GENERIC HISTORICAL  09/14/2016   IR FLUORO GUIDE CV LINE RIGHT 09/14/2016 [name], DO [facility]  . ORIF TIBIA PLATEAU Left 01/15/2018   Procedure: OPEN REDUCTION INTERNAL  FIXATION (ORIF) TIBIAL PLATEAU;  Surgeon: Altamese Shavano Park, MD;  Location: [place];  Service: Orthopedics;  Laterality: Left;    There were no vitals filed for this visit.  Subjective Assessment - 02/08/18 0839    Subjective  MD  said still NWB, Can't put foot on floor still     Currently in Pain?  No/denies                       [facility] Adult PT Treatment/Exercise - 02/08/18 0001      Exercises   Exercises  Knee/Hip      Knee/Hip Exercises: Supine   Short Arc Quad Sets  15 reps    Bridges  Both;15 reps    Bridges Limitations  legs over large bolster.     Other Supine Knee/Hip Exercises  Ball squeeze and green band abduction x15 each    Other Supine Knee/Hip Exercises  manually resisted abduction      Manual Therapy   Manual Therapy  Passive ROM;Soft tissue mobilization    Passive ROM  ankle all planes and knee flexion /ext x1 full ROM      Ankle Exercises: Supine   Other Supine Ankle Exercises  band green x 15 4 way             PT Education - 02/08/18 0913    Education  provided  Yes    Education Details  HEP    Person(s) Educated  Patient    Methods  Explanation;Tactile cues;Verbal cues;Handout    Comprehension  Returned demonstration;Verbalized understanding       PT Short Term Goals - 01/29/18 1238      PT SHORT TERM GOAL #1   Title  He will be independent with initial HEP    Time  4    Period  Weeks    Status  New      PT SHORT TERM GOAL #2   Title  He will have full ROM LT knee active    Time  4    Period  Weeks    Status  New      PT SHORT TERM GOAL #3   Title  LT quad strength 3+/5  with full extension active SLR LT    Time  4    Period  Weeks    Status  New        PT Long Term Goals - 01/29/18 1240      PT LONG TERM GOAL #1   Title  He will be independent with all HEP issued    Time  12    Period  Weeks    Status  New      PT LONG TERM GOAL #2   Title  He will walk with less restrictive device in and out of home if  allowed by MD    Time  12    Period  Weeks    Status  New      PT LONG TERM GOAL #3   Title  LT hip and quad strength min 4+/5 to allow independent activity at home    Time  12    Period  Weeks    Status  New      PT LONG TERM GOAL #4   Title  He will be able to negotiate 4 steps with SPC or 1 rail safely  and independent    Time  12    Period  Weeks    Status  New      PT LONG TERM GOAL #5   Title  He will return to normal home tasks with 2/10 max pain    Time  12    Period  Weeks    Status  New            Plan - 02/08/18 2947    Clinical Impression Statement  No pain today. He did all exercise without pain and agreed for HEP . 2 green bands issued. cautioned him to let pain be guide to reps .    PT Treatment/Interventions  Cryotherapy;Electrical Stimulation;Functional mobility training;Therapeutic activities;Patient/family education;Gait training;Stair training;Therapeutic exercise;Manual techniques;Passive range of motion;Taping    PT Next Visit Plan  Facilitation Lt quad , stretching flexion LT knee.,   modalities as needed,  progress per MD order.     PT Home Exercise Plan  QS, ankle DF, SAQ, LAQ, hip abduction /adduction, bridge, band ankle 4 way    Consulted and Agree with Plan of Care  Patient       Patient will benefit from skilled therapeutic intervention in order to improve the following deficits and impairments:  Pain, Decreased activity tolerance, Decreased range of motion, Decreased strength, Difficulty walking, Increased edema, Decreased endurance, Decreased balance  Visit Diagnosis: Muscle weakness (generalized)  Acute pain of left knee  Stiffness of left knee, not elsewhere classified  Difficulty  in walking, not elsewhere classified     Problem List Patient Active Problem List   Diagnosis Date Noted  . Open wound of left hand 01/18/2018  . Tibial fracture 01/14/2018  . Tibial plateau fracture, left 01/13/2018  . Malnutrition of moderate  degree 09/20/2016  . AKI (acute kidney injury) ([place])   . History of colon cancer   . Hyponatremia   . Leukocytosis   . Acute blood loss anemia   . Acute metabolic encephalopathy 27/61/4709  . ESRD on dialysis ([place]) 09/09/2016  . Hypertensive emergency 09/09/2016  . Acute respiratory failure with hypoxia ([place]) 09/09/2016  . Acute pulmonary edema ([place]) 09/09/2016  . Polysubstance abuse ([place]) 09/09/2016  . Nonadherence to medical treatment 09/09/2016  . Anemia due to chronic kidney disease 09/09/2016  . Altered mental status 09/09/2016  . Acute respiratory failure ([place])   . Drug ingestion   . Hypertensive heart disease 07/19/2016  . Chest pain   . Elevated troponin 07/18/2016  . Acute diastolic heart failure, NYHA class 2 ([place]) 07/17/2016  . Heroin abuse ([place]) 07/17/2016  . HTN (hypertension) 07/17/2016  . Benign prostate hyperplasia 07/16/2015    Darrel Hoover  PT 02/08/2018, 9:17 AM  [facility] [address] [place], [place], [address] Phone: [phone]   Fax:  [phone]  Name: Alessandro Griep MRN: [id number] Date of Birth: April 07, 1961

## 2018-02-08 NOTE — Patient Instructions (Signed)
15 reps, 1-2x/day, ankle band 4 way, hip abduction and adduction , SAQ LAQ, bridge, cued to slow down

## 2018-02-13 ENCOUNTER — Encounter: Payer: Self-pay | Admitting: Physical Therapy

## 2018-02-13 ENCOUNTER — Ambulatory Visit: Payer: Medicare Other | Attending: Orthopedic Surgery | Admitting: Physical Therapy

## 2018-02-13 DIAGNOSIS — M25562 Pain in left knee: Secondary | ICD-10-CM

## 2018-02-13 DIAGNOSIS — M6281 Muscle weakness (generalized): Secondary | ICD-10-CM | POA: Insufficient documentation

## 2018-02-13 DIAGNOSIS — M25662 Stiffness of left knee, not elsewhere classified: Secondary | ICD-10-CM | POA: Diagnosis present

## 2018-02-13 DIAGNOSIS — R262 Difficulty in walking, not elsewhere classified: Secondary | ICD-10-CM | POA: Diagnosis present

## 2018-02-13 NOTE — Therapy (Signed)
[place], [place], [address] Phone: [phone]   Fax:  [phone]  Physical Therapy Treatment  Patient Details  Name: Timothy [name] MRN: [id number] Date of Birth: [birth year] Referring Provider: Crawford Givens MD   Encounter Date: 02/13/2018  PT End of Session - 02/13/18 1038    Visit Number  3    Number of Visits  25    Date for PT Re-Evaluation  04/19/18    Authorization Type  MCR/MCD    Authorization Time Period  KX visit 15    PT Start Time  1030    PT Stop Time  1109    PT Time Calculation (min)  39 min       Past Medical History:  Diagnosis Date  . Acute diastolic heart failure ([place]) 07/17/2016  . BPH (benign prostatic hyperplasia)   . Chronic kidney disease   . Colon cancer ([place]) 2014  . Elevated troponin   . Hypertension     Past Surgical History:  Procedure Laterality Date  . ABDOMINAL SURGERY    . AV FISTULA PLACEMENT Left 09/19/2016   Procedure: Left arm Radiocephalic ARTERIOVENOUS (AV) FISTULA CREATION;  Surgeon: Conrad Andover, MD;  Location: [place];  Service: Vascular;  Laterality: Left;  . BASCILIC VEIN TRANSPOSITION Left 07/09/2017   Procedure: BRACHIOCEPHALIC FISTULA CREATION;  Surgeon: Conrad Newport, MD;  Location: [place];  Service: Vascular;  Laterality: Left;  . COLON SURGERY  2014  . INSERTION OF DIALYSIS CATHETER N/A 09/19/2016   Procedure: INSERTION OF TUNNELED DIALYSIS CATHETER;  Surgeon: Conrad Tripoli, MD;  Location: [place];  Service: Vascular;  Laterality: N/A;  . IR GENERIC HISTORICAL  09/14/2016   IR US GUIDE VASC ACCESS RIGHT 09/14/2016 [name], DO [facility]  . IR GENERIC HISTORICAL  09/14/2016   IR FLUORO GUIDE CV LINE RIGHT 09/14/2016 [name], DO [facility]  . ORIF TIBIA PLATEAU Left 01/15/2018   Procedure: OPEN REDUCTION INTERNAL FIXATION (ORIF) TIBIAL PLATEAU;  Surgeon: Altamese Spencerville, MD;  Location: [place];  Service: Orthopedics;  Laterality: Left;     There were no vitals filed for this visit.  Subjective Assessment - 02/13/18 1116    Currently in Pain?  No/denies    Aggravating Factors   weight to leg, bend knee , ice     Pain Relieving Factors  icy hot, meds          [facility] PT Assessment - 02/13/18 0001      AROM   Left Knee Extension  -25    Left Knee Flexion  130                   [facility] Adult PT Treatment/Exercise - 02/13/18 0001      Knee/Hip Exercises: Supine   Quad Sets  15 reps    Short Arc Quad Sets  15 reps    Bridges  Both;15 reps    Bridges Limitations  legs over large bolster.     Straight Leg Raises  10 reps -25    Other Supine Knee/Hip Exercises  green band abduction bilateral and unilateral     Other Supine Knee/Hip Exercises  manually resisted abduction      Knee/Hip Exercises: Sidelying   Clams  x10 with pillow between knees       Ankle Exercises: Supine   Other Supine Ankle Exercises  band green x 15 4 way, shown how to tie around balls of feet for hip  and ankle ER/Eversion               PT Short Term Goals - 01/29/18 1238      PT SHORT TERM GOAL #1   Title  He will be independent with initial HEP    Time  4    Period  Weeks    Status  New      PT SHORT TERM GOAL #2   Title  He will have full ROM LT knee active    Time  4    Period  Weeks    Status  New      PT SHORT TERM GOAL #3   Title  LT quad strength 3+/5  with full extension active SLR LT    Time  4    Period  Weeks    Status  New        PT Long Term Goals - 01/29/18 1240      PT LONG TERM GOAL #1   Title  He will be independent with all HEP issued    Time  12    Period  Weeks    Status  New      PT LONG TERM GOAL #2   Title  He will walk with less restrictive device in and out of home if allowed by MD    Time  12    Period  Weeks    Status  New      PT LONG TERM GOAL #3   Title  LT hip and quad strength min 4+/5 to allow independent activity at home    Time  12    Period  Weeks    Status   New      PT LONG TERM GOAL #4   Title  He will be able to negotiate 4 steps with SPC or 1 rail safely  and independent    Time  12    Period  Weeks    Status  New      PT LONG TERM GOAL #5   Title  He will return to normal home tasks with 2/10 max pain    Time  12    Period  Weeks    Status  New            Plan - 02/13/18 1047    Clinical Impression Statement  Sleeping on right side with left leg extended and Hip in Internal rotation. Discussed his weak external rotators and the need to change this position. Pt resistant to all cues/education to maintain proper alignment with therex and sleep. His quad lag has improved, able to perfrom SLR without assist today.     PT Next Visit Plan  Facilitation Lt quad , stretching flexion LT knee.,   modalities as needed,  progress per MD order.     PT Home Exercise Plan  QS, ankle DF, SAQ, LAQ, hip abduction /adduction, bridge, band ankle 4 way    Consulted and Agree with Plan of Care  Patient       Patient will benefit from skilled therapeutic intervention in order to improve the following deficits and impairments:  Pain, Decreased activity tolerance, Decreased range of motion, Decreased strength, Difficulty walking, Increased edema, Decreased endurance, Decreased balance  Visit Diagnosis: Muscle weakness (generalized)  Acute pain of left knee  Stiffness of left knee, not elsewhere classified  Difficulty in walking, not elsewhere classified     Problem List Patient Active Problem List   Diagnosis  Date Noted  . Open wound of left hand 01/18/2018  . Tibial fracture 01/14/2018  . Tibial plateau fracture, left 01/13/2018  . Malnutrition of moderate degree 09/20/2016  . AKI (acute kidney injury) ([place])   . History of colon cancer   . Hyponatremia   . Leukocytosis   . Acute blood loss anemia   . Acute metabolic encephalopathy 70/92/9574  . ESRD on dialysis ([place]) 09/09/2016  . Hypertensive emergency 09/09/2016  . Acute  respiratory failure with hypoxia ([place]) 09/09/2016  . Acute pulmonary edema ([place]) 09/09/2016  . Polysubstance abuse ([place]) 09/09/2016  . Nonadherence to medical treatment 09/09/2016  . Anemia due to chronic kidney disease 09/09/2016  . Altered mental status 09/09/2016  . Acute respiratory failure ([place])   . Drug ingestion   . Hypertensive heart disease 07/19/2016  . Chest pain   . Elevated troponin 07/18/2016  . Acute diastolic heart failure, NYHA class 2 ([place]) 07/17/2016  . Heroin abuse ([place]) 07/17/2016  . HTN (hypertension) 07/17/2016  . Benign prostate hyperplasia 07/16/2015    Dorene Ar, PTA 02/13/2018, 11:17 AM  [facility] [address] [place], [place], [address] Phone: [phone]   Fax:  [phone]  Name: Timothy [name] MRN: [id number] Date of Birth: 1960-12-15

## 2018-02-15 ENCOUNTER — Encounter: Payer: Self-pay | Admitting: Physical Therapy

## 2018-02-15 ENCOUNTER — Ambulatory Visit: Payer: Medicare Other | Admitting: Physical Therapy

## 2018-02-15 DIAGNOSIS — M25562 Pain in left knee: Secondary | ICD-10-CM

## 2018-02-15 DIAGNOSIS — M6281 Muscle weakness (generalized): Secondary | ICD-10-CM | POA: Diagnosis not present

## 2018-02-15 DIAGNOSIS — R262 Difficulty in walking, not elsewhere classified: Secondary | ICD-10-CM

## 2018-02-15 DIAGNOSIS — M25662 Stiffness of left knee, not elsewhere classified: Secondary | ICD-10-CM

## 2018-02-15 NOTE — Therapy (Signed)
[place], [place], [address] Phone: [phone]   Fax:  [phone]  Physical Therapy Treatment  Patient Details  Name: Timothy [name] MRN: [id number] Date of Birth: 01/08/1961 Referring Provider: Crawford Givens MD   Encounter Date: 02/15/2018  PT End of Session - 02/15/18 1005    Visit Number  4    Number of Visits  25    Date for PT Re-Evaluation  04/19/18    Authorization Type  MCR/MCD    Authorization Time Period  KX visit 15    PT Start Time  0928    PT Stop Time  1006    PT Time Calculation (min)  38 min       Past Medical History:  Diagnosis Date  . Acute diastolic heart failure ([place]) 07/17/2016  . BPH (benign prostatic hyperplasia)   . Chronic kidney disease   . Colon cancer ([place]) 2014  . Elevated troponin   . Hypertension     Past Surgical History:  Procedure Laterality Date  . ABDOMINAL SURGERY    . AV FISTULA PLACEMENT Left 09/19/2016   Procedure: Left arm Radiocephalic ARTERIOVENOUS (AV) FISTULA CREATION;  Surgeon: Conrad Sycamore, MD;  Location: [place];  Service: Vascular;  Laterality: Left;  . BASCILIC VEIN TRANSPOSITION Left 07/09/2017   Procedure: BRACHIOCEPHALIC FISTULA CREATION;  Surgeon: Conrad Larchwood, MD;  Location: [place];  Service: Vascular;  Laterality: Left;  . COLON SURGERY  2014  . INSERTION OF DIALYSIS CATHETER N/A 09/19/2016   Procedure: INSERTION OF TUNNELED DIALYSIS CATHETER;  Surgeon: Conrad Monroe, MD;  Location: [place];  Service: Vascular;  Laterality: N/A;  . IR GENERIC HISTORICAL  09/14/2016   IR US GUIDE VASC ACCESS RIGHT 09/14/2016 [name], DO [facility]  . IR GENERIC HISTORICAL  09/14/2016   IR FLUORO GUIDE CV LINE RIGHT 09/14/2016 [name], DO [facility]  . ORIF TIBIA PLATEAU Left 01/15/2018   Procedure: OPEN REDUCTION INTERNAL FIXATION (ORIF) TIBIAL PLATEAU;  Surgeon: Altamese Greycliff, MD;  Location: [place];  Service: Orthopedics;  Laterality: Left;     There were no vitals filed for this visit.  Subjective Assessment - 02/15/18 0938    Currently in Pain?  Yes    Pain Score  6     Pain Orientation  Left    Pain Descriptors / Indicators  Sore    Aggravating Factors   sitting, not moving    Pain Relieving Factors  icy hot, meds, keep moving                        [facility] Adult PT Treatment/Exercise - 02/15/18 0001      Knee/Hip Exercises: Seated   Long Arc Quad  20 reps    Heel Slides  20 reps towel    Marching  Left;10 reps      Knee/Hip Exercises: Supine   Quad Sets  10 reps    Heel Slides  20 reps    Straight Leg Raises  10 reps    Other Supine Knee/Hip Exercises  green band abduction bilateral and unilateral , ball squeeze       Knee/Hip Exercises: Sidelying   Clams  x20 with pillow between knees       Ankle Exercises: Supine   Other Supine Ankle Exercises  band green x 15 4 way             PT Education - 02/15/18 1005  Education provided  Yes    Education Details  HEP    Person(s) Educated  Patient    Methods  Explanation;Handout    Comprehension  Verbalized understanding       PT Short Term Goals - 01/29/18 1238      PT SHORT TERM GOAL #1   Title  He will be independent with initial HEP    Time  4    Period  Weeks    Status  New      PT SHORT TERM GOAL #2   Title  He will have full ROM LT knee active    Time  4    Period  Weeks    Status  New      PT SHORT TERM GOAL #3   Title  LT quad strength 3+/5  with full extension active SLR LT    Time  4    Period  Weeks    Status  New        PT Long Term Goals - 01/29/18 1240      PT LONG TERM GOAL #1   Title  He will be independent with all HEP issued    Time  12    Period  Weeks    Status  New      PT LONG TERM GOAL #2   Title  He will walk with less restrictive device in and out of home if allowed by MD    Time  12    Period  Weeks    Status  New      PT LONG TERM GOAL #3   Title  LT hip and quad strength min  4+/5 to allow independent activity at home    Time  12    Period  Weeks    Status  New      PT LONG TERM GOAL #4   Title  He will be able to negotiate 4 steps with SPC or 1 rail safely  and independent    Time  12    Period  Weeks    Status  New      PT LONG TERM GOAL #5   Title  He will return to normal home tasks with 2/10 max pain    Time  12    Period  Weeks    Status  New            Plan - 02/15/18 1012    Clinical Impression Statement  Pt reports feeling better today. His gait is less antlagic with RW. Discussed he should still not bear weight on LLE. Noted improvement in ability to maintain neutral alignment in LLE joints during therex. He requires min cues to stay on task during treatment. Added side clam  and SLR to HEP today.     PT Next Visit Plan  Facilitation Lt quad , stretching flexion LT knee.,   modalities as needed,  progress per MD order.     PT Home Exercise Plan  QS, ankle DF, SAQ, LAQ, hip abduction /adduction, bridge, band ankle 4 way, SLR, side CLAM        Patient will benefit from skilled therapeutic intervention in order to improve the following deficits and impairments:  Pain, Decreased activity tolerance, Decreased range of motion, Decreased strength, Difficulty walking, Increased edema, Decreased endurance, Decreased balance  Visit Diagnosis: Muscle weakness (generalized)  Acute pain of left knee  Stiffness of left knee, not elsewhere classified  Difficulty in walking, not  elsewhere classified     Problem List Patient Active Problem List   Diagnosis Date Noted  . Open wound of left hand 01/18/2018  . Tibial fracture 01/14/2018  . Tibial plateau fracture, left 01/13/2018  . Malnutrition of moderate degree 09/20/2016  . AKI (acute kidney injury) ([place])   . History of colon cancer   . Hyponatremia   . Leukocytosis   . Acute blood loss anemia   . Acute metabolic encephalopathy 33/35/4562  . ESRD on dialysis ([place]) 09/09/2016  .  Hypertensive emergency 09/09/2016  . Acute respiratory failure with hypoxia ([place]) 09/09/2016  . Acute pulmonary edema ([place]) 09/09/2016  . Polysubstance abuse ([place]) 09/09/2016  . Nonadherence to medical treatment 09/09/2016  . Anemia due to chronic kidney disease 09/09/2016  . Altered mental status 09/09/2016  . Acute respiratory failure ([place])   . Drug ingestion   . Hypertensive heart disease 07/19/2016  . Chest pain   . Elevated troponin 07/18/2016  . Acute diastolic heart failure, NYHA class 2 ([place]) 07/17/2016  . Heroin abuse ([place]) 07/17/2016  . HTN (hypertension) 07/17/2016  . Benign prostate hyperplasia 07/16/2015    Dorene Ar, PTA 02/15/2018, 10:56 AM  [facility] [address] [place], [place], [address] Phone: [phone]   Fax:  [phone]  Name: Timothy [name] MRN: [id number] Date of Birth: 02/04/1961

## 2018-02-18 ENCOUNTER — Ambulatory Visit: Payer: Medicare Other

## 2018-02-18 DIAGNOSIS — M25662 Stiffness of left knee, not elsewhere classified: Secondary | ICD-10-CM

## 2018-02-18 DIAGNOSIS — M6281 Muscle weakness (generalized): Secondary | ICD-10-CM | POA: Diagnosis not present

## 2018-02-18 DIAGNOSIS — R262 Difficulty in walking, not elsewhere classified: Secondary | ICD-10-CM

## 2018-02-18 DIAGNOSIS — M25562 Pain in left knee: Secondary | ICD-10-CM

## 2018-02-18 NOTE — Therapy (Signed)
[place], [place], [address] Phone: [phone]   Fax:  [phone]  Physical Therapy Treatment  Patient Details  Name: Timothy [name] MRN: [id number] Date of Birth: [birth year] Referring Provider: Crawford Givens MD   Encounter Date: 02/18/2018  PT End of Session - 02/18/18 1013    Visit Number  5    Number of Visits  25    Date for PT Re-Evaluation  04/19/18    Authorization Type  MCR/MCD    Authorization Time Period  KX visit 15    PT Start Time  1000    PT Stop Time  1040    PT Time Calculation (min)  40 min    Activity Tolerance  Patient tolerated treatment well    Behavior During Therapy  [facility] for tasks assessed/performed       Past Medical History:  Diagnosis Date  . Acute diastolic heart failure ([place]) 07/17/2016  . BPH (benign prostatic hyperplasia)   . Chronic kidney disease   . Colon cancer ([place]) 2014  . Elevated troponin   . Hypertension     Past Surgical History:  Procedure Laterality Date  . ABDOMINAL SURGERY    . AV FISTULA PLACEMENT Left 09/19/2016   Procedure: Left arm Radiocephalic ARTERIOVENOUS (AV) FISTULA CREATION;  Surgeon: Conrad Horton, MD;  Location: [place];  Service: Vascular;  Laterality: Left;  . BASCILIC VEIN TRANSPOSITION Left 07/09/2017   Procedure: BRACHIOCEPHALIC FISTULA CREATION;  Surgeon: Conrad Morton, MD;  Location: [place];  Service: Vascular;  Laterality: Left;  . COLON SURGERY  2014  . INSERTION OF DIALYSIS CATHETER N/A 09/19/2016   Procedure: INSERTION OF TUNNELED DIALYSIS CATHETER;  Surgeon: Conrad [place], MD;  Location: [place];  Service: Vascular;  Laterality: N/A;  . IR GENERIC HISTORICAL  09/14/2016   IR US GUIDE VASC ACCESS RIGHT 09/14/2016 [name], DO [facility]  . IR GENERIC HISTORICAL  09/14/2016   IR FLUORO GUIDE CV LINE RIGHT 09/14/2016 [name], DO [facility]  . ORIF TIBIA PLATEAU Left 01/15/2018   Procedure: OPEN REDUCTION INTERNAL FIXATION  (ORIF) TIBIAL PLATEAU;  Surgeon: Altamese [place], MD;  Location: [place];  Service: Orthopedics;  Laterality: Left;    There were no vitals filed for this visit.  Subjective Assessment - 02/18/18 1015    Subjective  Pain 5/10 today . not putting weight on LT leg . It will tell me if I do.     Pain Score  5     Pain Location  Knee    Pain Orientation  Left;Lateral    Pain Descriptors / Indicators  Sore    Pain Type  Surgical pain    Pain Onset  More than a month ago    Pain Frequency  Constant    Aggravating Factors   not moving    Pain Relieving Factors  meds , gentle activity , meds                       [facility] Adult PT Treatment/Exercise - 02/18/18 0001      Knee/Hip Exercises: Seated   Long Arc Quad  Left;2 sets;10 reps      Knee/Hip Exercises: Supine   Quad Sets  10 reps 10 sec hold    Heel Slides  Left;15 reps overpressure to full ROM     Bridges  Both;20 reps    Bridges Limitations  legs over large bolster.  Straight Leg Raises  10 reps    Other Supine Knee/Hip Exercises  ball squeeze x 20       Knee/Hip Exercises: Sidelying   Hip ABduction  Left;15 reps    Clams  x20 with pillow between knees       Ankle Exercises: Supine   Other Supine Ankle Exercises  band green x 15 4 way               PT Short Term Goals - 02/18/18 1040      PT SHORT TERM GOAL #2   Title  He will have full ROM LT knee active    Baseline  passive he does, active only 10 degrees from full    Status  Achieved      PT SHORT TERM GOAL #3   Title  LT quad strength 3+/5  with full extension active SLR LT    Baseline  still lag    Status  On-going        PT Long Term Goals - 01/29/18 1240      PT LONG TERM GOAL #1   Title  He will be independent with all HEP issued    Time  12    Period  Weeks    Status  New      PT LONG TERM GOAL #2   Title  He will walk with less restrictive device in and out of home if allowed by MD    Time  12    Period  Weeks    Status   New      PT LONG TERM GOAL #3   Title  LT hip and quad strength min 4+/5 to allow independent activity at home    Time  12    Period  Weeks    Status  New      PT LONG TERM GOAL #4   Title  He will be able to negotiate 4 steps with SPC or 1 rail safely  and independent    Time  12    Period  Weeks    Status  New      PT LONG TERM GOAL #5   Title  He will return to normal home tasks with 2/10 max pain    Time  12    Period  Weeks    Status  New            Plan - 02/18/18 1014    Clinical Impression Statement  Walked in with rolling walker at least TDWB .  Over all he is improving but will need improved strength in quads and hips to walk well when allowed by MD.     PT Treatment/Interventions  Cryotherapy;Electrical Stimulation;Functional mobility training;Therapeutic activities;Patient/family education;Gait training;Stair training;Therapeutic exercise;Manual techniques;Passive range of motion;Taping    PT Next Visit Plan  Facilitation Lt quad , stretching flexion LT knee.,   modalities as needed,  progress per MD order.     PT Home Exercise Plan  QS, ankle DF, SAQ, LAQ, hip abduction /adduction, bridge, band ankle 4 way, SLR, side CLAM     Consulted and Agree with Plan of Care  Patient       Patient will benefit from skilled therapeutic intervention in order to improve the following deficits and impairments:  Pain, Decreased activity tolerance, Decreased range of motion, Decreased strength, Difficulty walking, Increased edema, Decreased endurance, Decreased balance  Visit Diagnosis: Muscle weakness (generalized)  Acute pain of left knee  Stiffness of  left knee, not elsewhere classified  Difficulty in walking, not elsewhere classified     Problem List Patient Active Problem List   Diagnosis Date Noted  . Open wound of left hand 01/18/2018  . Tibial fracture 01/14/2018  . Tibial plateau fracture, left 01/13/2018  . Malnutrition of moderate degree 09/20/2016  .  AKI (acute kidney injury) ([place])   . History of colon cancer   . Hyponatremia   . Leukocytosis   . Acute blood loss anemia   . Acute metabolic encephalopathy 65/12/5463  . ESRD on dialysis ([place]) 09/09/2016  . Hypertensive emergency 09/09/2016  . Acute respiratory failure with hypoxia ([place]) 09/09/2016  . Acute pulmonary edema ([place]) 09/09/2016  . Polysubstance abuse ([place]) 09/09/2016  . Nonadherence to medical treatment 09/09/2016  . Anemia due to chronic kidney disease 09/09/2016  . Altered mental status 09/09/2016  . Acute respiratory failure ([place])   . Drug ingestion   . Hypertensive heart disease 07/19/2016  . Chest pain   . Elevated troponin 07/18/2016  . Acute diastolic heart failure, NYHA class 2 ([place]) 07/17/2016  . Heroin abuse ([place]) 07/17/2016  . HTN (hypertension) 07/17/2016  . Benign prostate hyperplasia 07/16/2015    Darrel Hoover  PT 02/18/2018, 10:41 AM  [facility] [address] [place], [place], [address] Phone: [phone]   Fax:  [phone]  Name: Timothy [name] MRN: [id number] Date of Birth: [birth year]

## 2018-02-20 ENCOUNTER — Ambulatory Visit: Payer: Medicare Other | Admitting: Physical Therapy

## 2018-02-20 ENCOUNTER — Encounter: Payer: Self-pay | Admitting: Physical Therapy

## 2018-02-20 DIAGNOSIS — M25562 Pain in left knee: Secondary | ICD-10-CM

## 2018-02-20 DIAGNOSIS — M6281 Muscle weakness (generalized): Secondary | ICD-10-CM | POA: Diagnosis not present

## 2018-02-20 DIAGNOSIS — M25662 Stiffness of left knee, not elsewhere classified: Secondary | ICD-10-CM

## 2018-02-20 DIAGNOSIS — R262 Difficulty in walking, not elsewhere classified: Secondary | ICD-10-CM

## 2018-02-20 NOTE — Therapy (Signed)
[place], [place], [address] Phone: [phone]   Fax:  [phone]  Physical Therapy Treatment  Patient Details  Name: Timothy [name] MRN: [id number] Date of Birth: 06/04/1961 Referring Provider: Crawford Givens MD   Encounter Date: 02/20/2018  PT End of Session - 02/20/18 1108    Visit Number  6    Date for PT Re-Evaluation  04/19/18    Authorization Type  MCR/MCD    Authorization Time Period  KX visit 15    PT Start Time  1102    PT Stop Time  1140    PT Time Calculation (min)  38 min       Past Medical History:  Diagnosis Date  . Acute diastolic heart failure ([place]) 07/17/2016  . BPH (benign prostatic hyperplasia)   . Chronic kidney disease   . Colon cancer ([place]) 2014  . Elevated troponin   . Hypertension     Past Surgical History:  Procedure Laterality Date  . ABDOMINAL SURGERY    . AV FISTULA PLACEMENT Left 09/19/2016   Procedure: Left arm Radiocephalic ARTERIOVENOUS (AV) FISTULA CREATION;  Surgeon: Conrad Talmage, MD;  Location: [place];  Service: Vascular;  Laterality: Left;  . BASCILIC VEIN TRANSPOSITION Left 07/09/2017   Procedure: BRACHIOCEPHALIC FISTULA CREATION;  Surgeon: Conrad [place], MD;  Location: [place];  Service: Vascular;  Laterality: Left;  . COLON SURGERY  2014  . INSERTION OF DIALYSIS CATHETER N/A 09/19/2016   Procedure: INSERTION OF TUNNELED DIALYSIS CATHETER;  Surgeon: Conrad [place], MD;  Location: [place];  Service: Vascular;  Laterality: N/A;  . IR GENERIC HISTORICAL  09/14/2016   IR US GUIDE VASC ACCESS RIGHT 09/14/2016 [name], DO [facility]  . IR GENERIC HISTORICAL  09/14/2016   IR FLUORO GUIDE CV LINE RIGHT 09/14/2016 [name], DO [facility]  . ORIF TIBIA PLATEAU Left 01/15/2018   Procedure: OPEN REDUCTION INTERNAL FIXATION (ORIF) TIBIAL PLATEAU;  Surgeon: Altamese [place], MD;  Location: [place];  Service: Orthopedics;  Laterality: Left;    There were no vitals  filed for this visit.  Subjective Assessment - 02/20/18 1107    Subjective  Pain 4/10, saw MD this morning.     Currently in Pain?  Yes    Pain Score  4     Pain Location  Knee    Pain Orientation  Left    Pain Descriptors / Indicators  Sore                       [facility] Adult PT Treatment/Exercise - 02/20/18 0001      Knee/Hip Exercises: Aerobic   Recumbent Bike  Full revolutions x 10 minutes       Knee/Hip Exercises: Seated   Long Arc Quad  Left;2 sets;10 reps 2#      Knee/Hip Exercises: Supine   Short Arc Quad Sets  15 reps 2#    Heel Slides  10 reps    Bridges  20 reps    Bridges Limitations  legs over physioball     Straight Leg Raises  10 reps    Other Supine Knee/Hip Exercises  hamstring curls with legs on physioball x 20     Other Supine Knee/Hip Exercises  ball squeeze x 20       Knee/Hip Exercises: Sidelying   Hip ABduction  10 reps 2 sets    Clams  10 x 2  Knee/Hip Exercises: Prone   Hamstring Curl  20 reps    Hip Extension  10 reps      Ankle Exercises: Supine   Other Supine Ankle Exercises  band green x 15 4 way               PT Short Term Goals - 02/18/18 1040      PT SHORT TERM GOAL #2   Title  He will have full ROM LT knee active    Baseline  passive he does, active only 10 degrees from full    Status  Achieved      PT SHORT TERM GOAL #3   Title  LT quad strength 3+/5  with full extension active SLR LT    Baseline  still lag    Status  On-going        PT Long Term Goals - 01/29/18 1240      PT LONG TERM GOAL #1   Title  He will be independent with all HEP issued    Time  12    Period  Weeks    Status  New      PT LONG TERM GOAL #2   Title  He will walk with less restrictive device in and out of home if allowed by MD    Time  12    Period  Weeks    Status  New      PT LONG TERM GOAL #3   Title  LT hip and quad strength min 4+/5 to allow independent activity at home    Time  12    Period  Weeks     Status  New      PT LONG TERM GOAL #4   Title  He will be able to negotiate 4 steps with SPC or 1 rail safely  and independent    Time  12    Period  Weeks    Status  New      PT LONG TERM GOAL #5   Title  He will return to normal home tasks with 2/10 max pain    Time  12    Period  Weeks    Status  New            Plan - 02/20/18 1110    Clinical Impression Statement  Pt arrives after seeing MD this morning. He has a new referral to continue PT with strengthening, bike and to remain NWB x 3 more weeks. Pt was unaware of continued NMW status and is reluctant. Education provided for NWB needed for proper healing. Began rec bike with good tolerance, he reported loosening up of knee. Continued opend chain exercises for knee, hip and ankle strength. Light resistance tolerated well.     PT Next Visit Plan  Facilitation Lt quad , stretching flexion LT knee.,   modalities as needed,  progress per MD order. NWB until 03/13/2018    PT Home Exercise Plan  QS, ankle DF, SAQ, LAQ, hip abduction /adduction, bridge, band ankle 4 way, SLR, side CLAM     Consulted and Agree with Plan of Care  Patient       Patient will benefit from skilled therapeutic intervention in order to improve the following deficits and impairments:  Pain, Decreased activity tolerance, Decreased range of motion, Decreased strength, Difficulty walking, Increased edema, Decreased endurance, Decreased balance  Visit Diagnosis: Muscle weakness (generalized)  Acute pain of left knee  Stiffness of left knee, not elsewhere classified  Difficulty in walking, not elsewhere classified     Problem List Patient Active Problem List   Diagnosis Date Noted  . Open wound of left hand 01/18/2018  . Tibial fracture 01/14/2018  . Tibial plateau fracture, left 01/13/2018  . Malnutrition of moderate degree 09/20/2016  . AKI (acute kidney injury) ([place])   . History of colon cancer   . Hyponatremia   . Leukocytosis   . Acute blood  loss anemia   . Acute metabolic encephalopathy 19/16/6060  . ESRD on dialysis ([place]) 09/09/2016  . Hypertensive emergency 09/09/2016  . Acute respiratory failure with hypoxia ([place]) 09/09/2016  . Acute pulmonary edema ([place]) 09/09/2016  . Polysubstance abuse ([place]) 09/09/2016  . Nonadherence to medical treatment 09/09/2016  . Anemia due to chronic kidney disease 09/09/2016  . Altered mental status 09/09/2016  . Acute respiratory failure ([place])   . Drug ingestion   . Hypertensive heart disease 07/19/2016  . Chest pain   . Elevated troponin 07/18/2016  . Acute diastolic heart failure, NYHA class 2 ([place]) 07/17/2016  . Heroin abuse ([place]) 07/17/2016  . HTN (hypertension) 07/17/2016  . Benign prostate hyperplasia 07/16/2015    Dorene Ar, PTA 02/20/2018, 11:45 AM  [place], [place], [address] Phone: [phone]   Fax:  [phone]  Name: Timothy [name] MRN: [id number] Date of Birth: 03/08/1961

## 2018-02-25 ENCOUNTER — Ambulatory Visit: Payer: Medicare Other | Admitting: Physical Therapy

## 2018-02-27 ENCOUNTER — Ambulatory Visit: Payer: Medicare Other

## 2018-02-27 DIAGNOSIS — M25562 Pain in left knee: Secondary | ICD-10-CM

## 2018-02-27 DIAGNOSIS — M6281 Muscle weakness (generalized): Secondary | ICD-10-CM | POA: Diagnosis not present

## 2018-02-27 DIAGNOSIS — R262 Difficulty in walking, not elsewhere classified: Secondary | ICD-10-CM

## 2018-02-27 DIAGNOSIS — M25662 Stiffness of left knee, not elsewhere classified: Secondary | ICD-10-CM

## 2018-02-27 NOTE — Therapy (Addendum)
[place], [place], [address] Phone: [phone]   Fax:  [phone]  Physical Therapy Treatment/Discharge  Patient Details  Name: Timothy [name] MRN: [id number] Date of Birth: 08-21-1961 Referring Provider: Crawford Givens MD   Encounter Date: 02/27/2018  PT End of Session - 02/27/18 1023    Visit Number  7    Number of Visits  25    Date for PT Re-Evaluation  04/19/18    Authorization Type  MCR/MCD    Authorization Time Period  KX visit 15    PT Start Time  1022    PT Stop Time  1100    PT Time Calculation (min)  38 min    Activity Tolerance  Patient tolerated treatment well    Behavior During Therapy  [facility] for tasks assessed/performed       Past Medical History:  Diagnosis Date  . Acute diastolic heart failure ([name]) 07/17/2016  . BPH (benign prostatic hyperplasia)   . Chronic kidney disease   . Colon cancer ([place]) 2014  . Elevated troponin   . Hypertension     Past Surgical History:  Procedure Laterality Date  . ABDOMINAL SURGERY    . AV FISTULA PLACEMENT Left 09/19/2016   Procedure: Left arm Radiocephalic ARTERIOVENOUS (AV) FISTULA CREATION;  Surgeon: Conrad [place], MD;  Location: [place];  Service: Vascular;  Laterality: Left;  . BASCILIC VEIN TRANSPOSITION Left 07/09/2017   Procedure: BRACHIOCEPHALIC FISTULA CREATION;  Surgeon: Conrad [place], MD;  Location: [place];  Service: Vascular;  Laterality: Left;  . COLON SURGERY  2014  . INSERTION OF DIALYSIS CATHETER N/A 09/19/2016   Procedure: INSERTION OF TUNNELED DIALYSIS CATHETER;  Surgeon: Conrad [place], MD;  Location: [place];  Service: Vascular;  Laterality: N/A;  . IR GENERIC HISTORICAL  09/14/2016   IR US GUIDE VASC ACCESS RIGHT 09/14/2016 [name], DO [facility]  . IR GENERIC HISTORICAL  09/14/2016   IR FLUORO GUIDE CV LINE RIGHT 09/14/2016 [name], DO [facility]  . ORIF TIBIA PLATEAU Left 01/15/2018   Procedure: OPEN REDUCTION  INTERNAL FIXATION (ORIF) TIBIAL PLATEAU;  Surgeon: Altamese [place], MD;  Location: [place];  Service: Orthopedics;  Laterality: Left;    There were no vitals filed for this visit.  Subjective Assessment - 02/27/18 1026    Subjective  no pain today    Currently in Pain?  No/denies         [facility] PT Assessment - 02/27/18 0001      AROM   Left Knee Extension  -25    Left Knee Flexion  153                   [facility] Adult PT Treatment/Exercise - 02/27/18 0001      Knee/Hip Exercises: Aerobic   Recumbent Bike  Full revolutions x 10 minutes       Knee/Hip Exercises: Seated   Long Arc Quad  Left;2 sets;10 reps      Knee/Hip Exercises: Supine   Short Arc Quad Sets  2 sets;Left;10 reps 3 pounds    Bridges  20 reps    Bridges Limitations  legs over large bolster    Straight Leg Raises  10 reps quad lag    Other Supine Knee/Hip Exercises  ball squeeze x 20       Knee/Hip Exercises: Sidelying   Hip ABduction  Left;2 sets;10 reps    Clams  15      Knee/Hip  Exercises: Prone   Hamstring Curl  15 reps 3 pounds    Hip Extension  15 reps;Left               PT Short Term Goals - 02/27/18 1040      PT SHORT TERM GOAL #1   Title  He will be independent with initial HEP    Status  On-going      PT SHORT TERM GOAL #2   Title  He will have full ROM LT knee active    Status  Achieved      PT SHORT TERM GOAL #3   Title  LT quad strength 3+/5  with full extension active SLR LT    Baseline  still lag    Status  On-going        PT Long Term Goals - 01/29/18 1240      PT LONG TERM GOAL #1   Title  He will be independent with all HEP issued    Time  12    Period  Weeks    Status  New      PT LONG TERM GOAL #2   Title  He will walk with less restrictive device in and out of home if allowed by MD    Time  12    Period  Weeks    Status  New      PT LONG TERM GOAL #3   Title  LT hip and quad strength min 4+/5 to allow independent activity at home    Time  12     Period  Weeks    Status  New      PT LONG TERM GOAL #4   Title  He will be able to negotiate 4 steps with SPC or 1 rail safely  and independent    Time  12    Period  Weeks    Status  New      PT LONG TERM GOAL #5   Title  He will return to normal home tasks with 2/10 max pain    Time  12    Period  Weeks    Status  New            Plan - 02/27/18 1023    Clinical Impression Statement  He continues to weight bear with walking. Lateral instability noted LT knee.   Improved but still with quad lag.      PT Treatment/Interventions  Cryotherapy;Electrical Stimulation;Functional mobility training;Therapeutic activities;Patient/family education;Gait training;Stair training;Therapeutic exercise;Manual techniques;Passive range of motion;Taping    PT Next Visit Plan  Facilitation Lt quad , stretching flexion LT knee.,   modalities as needed,  progress per MD order. NWB until 03/13/2018    PT Home Exercise Plan  QS, ankle DF, SAQ, LAQ, hip abduction /adduction, bridge, band ankle 4 way, SLR, side CLAM     Consulted and Agree with Plan of Care  Patient       Patient will benefit from skilled therapeutic intervention in order to improve the following deficits and impairments:  Pain, Decreased activity tolerance, Decreased range of motion, Decreased strength, Difficulty walking, Increased edema, Decreased endurance, Decreased balance  Visit Diagnosis: Muscle weakness (generalized)  Acute pain of left knee  Stiffness of left knee, not elsewhere classified  Difficulty in walking, not elsewhere classified     Problem List Patient Active Problem List   Diagnosis Date Noted  . Open wound of left hand 01/18/2018  . Tibial fracture 01/14/2018  .  Tibial plateau fracture, left 01/13/2018  . Malnutrition of moderate degree 09/20/2016  . AKI (acute kidney injury) ([place])   . History of colon cancer   . Hyponatremia   . Leukocytosis   . Acute blood loss anemia   . Acute metabolic  encephalopathy 54/30/1484  . ESRD on dialysis ([place]) 09/09/2016  . Hypertensive emergency 09/09/2016  . Acute respiratory failure with hypoxia ([place]) 09/09/2016  . Acute pulmonary edema ([place]) 09/09/2016  . Polysubstance abuse ([place]) 09/09/2016  . Nonadherence to medical treatment 09/09/2016  . Anemia due to chronic kidney disease 09/09/2016  . Altered mental status 09/09/2016  . Acute respiratory failure ([place])   . Drug ingestion   . Hypertensive heart disease 07/19/2016  . Chest pain   . Elevated troponin 07/18/2016  . Acute diastolic heart failure, NYHA class 2 ([place]) 07/17/2016  . Heroin abuse ([place]) 07/17/2016  . HTN (hypertension) 07/17/2016  . Benign prostate hyperplasia 07/16/2015    Darrel Hoover  PT 02/27/2018, 10:54 AM  [facility] [address] [place], [place], [address] Phone: [phone]   Fax:  [phone]  Name: Timothy [name] MRN: [id number] Date of Birth: Mar 31, 1961  PHYSICAL THERAPY DISCHARGE SUMMARY  Visits from Start of Care: 7  Current functional level related to goals / functional outcomes: Unknown as he did not return after this session. Marland Kitchen   He will need new order to return to PT   Remaining deficits: Unknown   Education / Equipment: HEP Plan:                                                    Patient goals were partially met. Patient is being discharged due to not returning since the last visit.  ?????   Pearson Forster PT   04/01/18

## 2018-03-04 ENCOUNTER — Ambulatory Visit (INDEPENDENT_AMBULATORY_CARE_PROVIDER_SITE_OTHER): Payer: Medicare Other | Admitting: Physician Assistant

## 2018-03-04 ENCOUNTER — Other Ambulatory Visit: Payer: Self-pay

## 2018-03-04 VITALS — BP 178/102 | HR 75 | Temp 97.8°F | Resp 18 | Ht 73.0 in | Wt 160.4 lb

## 2018-03-04 DIAGNOSIS — L989 Disorder of the skin and subcutaneous tissue, unspecified: Secondary | ICD-10-CM | POA: Diagnosis not present

## 2018-03-04 DIAGNOSIS — Z992 Dependence on renal dialysis: Secondary | ICD-10-CM | POA: Diagnosis not present

## 2018-03-04 DIAGNOSIS — N186 End stage renal disease: Secondary | ICD-10-CM | POA: Diagnosis not present

## 2018-03-04 NOTE — Progress Notes (Signed)
HISTORY AND PHYSICAL     CC:  Wart on chest/abdomen Requesting Provider:  [name], MD  HPI: This is a 57 y.o. male with ESRD on dialysis who states he had his right tunneled IJ catheter removed by Dr. Augustin Coupe about 2 months ago and says Dr. Augustin Coupe referred him to [place].  He states that about a week after it was removed, he developed a wart like place that is located just below the nipple line lateral to midline and 3-4 other smaller places on his abdomen around his umbilicus.  He states that it itches.  He says he has squeezed it and twisted it to try to make it stop itching.  He states if something is not done about it soon, he is going to cut it off himself with a straight razor blade.  He states he has never seen a dermatologist.  He has a fistula in his left arm that he says works well.   Past Medical History:  Diagnosis Date  . Acute diastolic heart failure ([place]) 07/17/2016  . BPH (benign prostatic hyperplasia)   . Chronic kidney disease   . Colon cancer ([place]) 2014  . Elevated troponin   . Hypertension     Past Surgical History:  Procedure Laterality Date  . ABDOMINAL SURGERY    . AV FISTULA PLACEMENT Left 09/19/2016   Procedure: Left arm Radiocephalic ARTERIOVENOUS (AV) FISTULA CREATION;  Surgeon: Conrad Schulter, MD;  Location: [facility];  Service: Vascular;  Laterality: Left;  . BASCILIC VEIN TRANSPOSITION Left 07/09/2017   Procedure: BRACHIOCEPHALIC FISTULA CREATION;  Surgeon: Conrad Wagener, MD;  Location: [facility];  Service: Vascular;  Laterality: Left;  . COLON SURGERY  2014  . INSERTION OF DIALYSIS CATHETER N/A 09/19/2016   Procedure: INSERTION OF TUNNELED DIALYSIS CATHETER;  Surgeon: Conrad [place], MD;  Location: [facility];  Service: Vascular;  Laterality: N/A;  . IR GENERIC HISTORICAL  09/14/2016   IR US GUIDE VASC ACCESS RIGHT 09/14/2016 [name], DO [facility]  . IR GENERIC HISTORICAL  09/14/2016   IR FLUORO GUIDE CV LINE RIGHT 09/14/2016 [name], DO [facility]  . ORIF  TIBIA PLATEAU Left 01/15/2018   Procedure: OPEN REDUCTION INTERNAL FIXATION (ORIF) TIBIAL PLATEAU;  Surgeon: Altamese Hoffman, MD;  Location: [facility];  Service: Orthopedics;  Laterality: Left;    Allergies  Allergen Reactions  . No Known Allergies     Current Outpatient Medications  Medication Sig Dispense Refill  . albuterol (PROVENTIL HFA;VENTOLIN HFA) 108 (90 Base) MCG/ACT inhaler Inhale 2 puffs into the lungs 3 (three) times daily as needed for shortness of breath.  1  . amLODipine (NORVASC) 10 MG tablet Take 1 tablet (10 mg total) by mouth daily. 3 tablet 30  . aspirin EC 325 MG tablet Take 1 tablet (325 mg total) by mouth daily. 30 tablet 0  . bacitracin ointment Apply topically daily. 120 g 0  . calcium acetate (PHOSLO) 667 MG capsule Take 2 capsules (1,334 mg total) by mouth 3 (three) times daily with meals. 60 capsule 0  . diltiazem (CARDIZEM CD) 360 MG 24 hr capsule Take 1 capsule by mouth daily.    Marland Kitchen amoxicillin-clavulanate (AUGMENTIN) 500-125 MG tablet Take 1 tablet (500 mg total) by mouth daily. (Patient not taking: Reported on 03/04/2018) 5 tablet 0   No current facility-administered medications for this visit.     Family History  Problem Relation Age of Onset  . Heart disease Mother  Died at age 68.  Marland Kitchen Heart failure Mother   . Kidney failure Sister     Social History   Socioeconomic History  . Marital status: Married    Spouse name: Not on file  . Number of children: Not on file  . Years of education: Not on file  . Highest education level: Not on file  Occupational History  . Not on file  Social Needs  . Financial resource strain: Not on file  . Food insecurity:    Worry: Not on file    Inability: Not on file  . Transportation needs:    Medical: Not on file    Non-medical: Not on file  Tobacco Use  . Smoking status: Former Smoker    Packs/day: 0.25    Types: Cigarettes    Last attempt to quit: 07/06/2017    Years since quitting: 0.6  . Smokeless  tobacco: Never Used  Substance and Sexual Activity  . Alcohol use: No    Alcohol/week: 4.2 oz    Types: 7 Cans of beer per week  . Drug use: Yes    Frequency: 1.0 times per week    Types: Marijuana, Heroin    Comment: snorts heroin, takes pain pills that he buys off the street  . Sexual activity: Not on file    Comment: once a week  Lifestyle  . Physical activity:    Days per week: Not on file    Minutes per session: Not on file  . Stress: Not on file  Relationships  . Social connections:    Talks on phone: Not on file    Gets together: Not on file    Attends religious service: Not on file    Active member of club or organization: Not on file    Attends meetings of clubs or organizations: Not on file    Relationship status: Not on file  . Intimate partner violence:    Fear of current or ex partner: Not on file    Emotionally abused: Not on file    Physically abused: Not on file    Forced sexual activity: Not on file  Other Topics Concern  . Not on file  Social History Narrative  . Not on file     REVIEW OF SYSTEMS:   [X]  denotes positive finding, [ ]  denotes negative finding Cardiac  Comments:  Chest pain or chest pressure:    Shortness of breath upon exertion:    Short of breath when lying flat:    Irregular heart rhythm:        Vascular    Pain in calf, thigh, or hip brought on by ambulation:    Pain in feet at night that wakes you up from your sleep:     Blood clot in your veins:    Leg swelling:         Pulmonary    Oxygen at home:    Productive cough:     Wheezing:         Neurologic    Sudden weakness in arms or legs:     Sudden numbness in arms or legs:     Sudden onset of difficulty speaking or slurred speech:    Temporary loss of vision in one eye:     Problems with dizziness:         Gastrointestinal    Blood in stool:     Vomited blood:         Genitourinary  Burning when urinating:     Blood in urine:        Psychiatric    Major  depression:         Hematologic    Bleeding problems:    Problems with blood clotting too easily:        Skin    Rashes or ulcers: x See HPI      Constitutional    Fever or chills:      PHYSICAL EXAMINATION:  Vitals:   03/04/18 1302 03/04/18 1305  BP: (!) 161/99 (!) 178/102  Pulse: 75 75  Resp: 18   Temp: 97.8 F (36.6 C)   SpO2: 98%    Vitals:   03/04/18 1302  Weight: 160 lb 6.4 oz (72.8 kg)  Height: 6\' 1"  (1.854 m)   Body mass index is 21.16 kg/m.  General:  WDWN in NAD; vital signs documented above Gait: slow with a walker HENT: WNL, normocephalic Pulmonary: normal non-labored breathing Cardiac:regular Skin: protruding wart like mass ~ 1cm on the anterior chest lateral to midline and just below the nipple line; there are several flat smaller areas around his umbilicus Vascular Exam/Pulses:  Right Left  Radial 2+ (normal) 2+ (normal)  Ulnar 2+ (normal) 2+ (normal)   Extremities: excellent thrill in LUA AVF Musculoskeletal: no muscle wasting or atrophy  Neurologic: A&O X 3;  No focal weakness or paresthesias are detected Psychiatric:  The pt has Normal affect, but upset about this lesion he presents for   Non-Invasive Vascular Imaging:   None today  Pt meds includes: Statin:  No. Beta Blocker:  No. Aspirin:  Yes.   ACEI:  No. ARB:  No. CCB use:  Yes Other Antiplatelet/Anticoagulant:  No   ASSESSMENT/PLAN:: 57 y.o. male with ESRD on dialysis presents today with wart like lesion after TDC removed by Dr. Augustin Coupe   -area where [facility] was removed is well healed.  He has a protruding wart like lesion ~1cm that is present on the anterior chest on the right below the nipple line and lateral to midline.  I don't feel this is related to removing his catheter.  I have recommended him seeing a dermatologist to be evaluated.  He states that he is going to cut it off with a razor blade and I advised against this as it may need to be excised and sent to pathology to make  sure it is nothing more serious.  Our office will make the referral. -his fistula has an excellent thrill and is easy to palpate.  -he will f/u with [place] as needed.   Leontine Locket, PA-C [facility] [phone]  Clinic MD: Trula Slade

## 2018-04-15 ENCOUNTER — Encounter (HOSPITAL_COMMUNITY): Payer: Self-pay | Admitting: Emergency Medicine

## 2018-04-15 ENCOUNTER — Other Ambulatory Visit: Payer: Self-pay

## 2018-04-15 ENCOUNTER — Emergency Department (HOSPITAL_COMMUNITY)
Admission: EM | Admit: 2018-04-15 | Discharge: 2018-04-15 | Disposition: A | Discharge status: Home / Self Care | Payer: Medicare Other | Attending: Emergency Medicine | Admitting: Emergency Medicine

## 2018-04-15 ENCOUNTER — Emergency Department (HOSPITAL_COMMUNITY): Payer: Medicare Other

## 2018-04-15 DIAGNOSIS — Y999 Unspecified external cause status: Secondary | ICD-10-CM | POA: Insufficient documentation

## 2018-04-15 DIAGNOSIS — Z79899 Other long term (current) drug therapy: Secondary | ICD-10-CM | POA: Insufficient documentation

## 2018-04-15 DIAGNOSIS — Z87891 Personal history of nicotine dependence: Secondary | ICD-10-CM | POA: Insufficient documentation

## 2018-04-15 DIAGNOSIS — S92415A Nondisplaced fracture of proximal phalanx of left great toe, initial encounter for closed fracture: Secondary | ICD-10-CM | POA: Diagnosis not present

## 2018-04-15 DIAGNOSIS — Y9289 Other specified places as the place of occurrence of the external cause: Secondary | ICD-10-CM | POA: Diagnosis not present

## 2018-04-15 DIAGNOSIS — Z85038 Personal history of other malignant neoplasm of large intestine: Secondary | ICD-10-CM | POA: Diagnosis not present

## 2018-04-15 DIAGNOSIS — S92901A Unspecified fracture of right foot, initial encounter for closed fracture: Secondary | ICD-10-CM

## 2018-04-15 DIAGNOSIS — N186 End stage renal disease: Secondary | ICD-10-CM | POA: Diagnosis not present

## 2018-04-15 DIAGNOSIS — Z7982 Long term (current) use of aspirin: Secondary | ICD-10-CM | POA: Insufficient documentation

## 2018-04-15 DIAGNOSIS — I5031 Acute diastolic (congestive) heart failure: Secondary | ICD-10-CM | POA: Diagnosis not present

## 2018-04-15 DIAGNOSIS — I132 Hypertensive heart and chronic kidney disease with heart failure and with stage 5 chronic kidney disease, or end stage renal disease: Secondary | ICD-10-CM | POA: Insufficient documentation

## 2018-04-15 DIAGNOSIS — S99921A Unspecified injury of right foot, initial encounter: Secondary | ICD-10-CM | POA: Diagnosis present

## 2018-04-15 DIAGNOSIS — Z992 Dependence on renal dialysis: Secondary | ICD-10-CM | POA: Diagnosis not present

## 2018-04-15 DIAGNOSIS — Y939 Activity, unspecified: Secondary | ICD-10-CM | POA: Insufficient documentation

## 2018-04-15 DIAGNOSIS — W010XXA Fall on same level from slipping, tripping and stumbling without subsequent striking against object, initial encounter: Secondary | ICD-10-CM | POA: Diagnosis not present

## 2018-04-15 MED ORDER — ACETAMINOPHEN 325 MG PO TABS
650.0000 mg | ORAL_TABLET | Freq: Once | ORAL | Status: AC
  Administered 2018-04-15: 650 mg via ORAL
  Filled 2018-04-15: qty 2

## 2018-04-15 MED ORDER — OXYCODONE-ACETAMINOPHEN 5-325 MG PO TABS: 2.0000 | ORAL_TABLET | ORAL | 0 refills | Status: DC | PRN

## 2018-04-15 MED ORDER — OXYCODONE HCL 5 MG PO TABS
5.0000 mg | ORAL_TABLET | Freq: Once | ORAL | Status: AC
  Administered 2018-04-15: 5 mg via ORAL
  Filled 2018-04-15: qty 1

## 2018-04-15 NOTE — ED Triage Notes (Signed)
Pt here via [facility] for foot pain after kicking curb.

## 2018-04-15 NOTE — ED Notes (Signed)
Patient transported to X-ray 

## 2018-04-15 NOTE — ED Notes (Signed)
Pt continuously calls out for help even though he has call bell.  Pt has been seen by the EDP and has been informed that the nurse is getting him medicine and he continues to call out.

## 2018-04-15 NOTE — ED Notes (Signed)
ED Provider at bedside. 

## 2018-04-15 NOTE — Progress Notes (Signed)
Orthopedic Tech Progress Note Patient Details:  Timothy [name] 05-Dec-1960 [id number]  Ortho Devices Type of Ortho Device: Crutches, CAM walker Ortho Device/Splint Location: rle Ortho Device/Splint Interventions: Application   Post Interventions Patient Tolerated: Well Instructions Provided: Care of device   Hildred Priest 04/15/2018, 5:56 PM

## 2018-04-15 NOTE — ED Notes (Signed)
See EDP assessment 

## 2018-04-15 NOTE — ED Provider Notes (Signed)
[facility] EMERGENCY DEPARTMENT Provider Note   CSN: [id number] Arrival date & time: 04/15/18  1558     History   Chief Complaint Chief Complaint  Patient presents with  . Foot Pain    HPI Timothy [name] is a 57 y.o. male.  HPI   57 year old male presents today with complaints of foot pain.  Patient notes he tripped over a curb causing pain to his right foot diffusely.  Patient notes no medication prior to arrival, denies any other injuries.   Past Medical History:  Diagnosis Date  . Acute diastolic heart failure ([place]) 07/17/2016  . BPH (benign prostatic hyperplasia)   . Chronic kidney disease   . Colon cancer ([place]) 2014  . Elevated troponin   . Hypertension     Patient Active Problem List   Diagnosis Date Noted  . Open wound of left hand 01/18/2018  . Tibial fracture 01/14/2018  . Tibial plateau fracture, left 01/13/2018  . Malnutrition of moderate degree 09/20/2016  . AKI (acute kidney injury) ([place])   . History of colon cancer   . Hyponatremia   . Leukocytosis   . Acute blood loss anemia   . Acute metabolic encephalopathy 08/12/2535  . ESRD on dialysis ([place]) 09/09/2016  . Hypertensive emergency 09/09/2016  . Acute respiratory failure with hypoxia ([place]) 09/09/2016  . Acute pulmonary edema ([place]) 09/09/2016  . Polysubstance abuse ([place]) 09/09/2016  . Nonadherence to medical treatment 09/09/2016  . Anemia due to chronic kidney disease 09/09/2016  . Altered mental status 09/09/2016  . Acute respiratory failure ([place])   . Drug ingestion   . Hypertensive heart disease 07/19/2016  . Chest pain   . Elevated troponin 07/18/2016  . Acute diastolic heart failure, NYHA class 2 ([place]) 07/17/2016  . Heroin abuse ([place]) 07/17/2016  . HTN (hypertension) 07/17/2016  . Benign prostate hyperplasia 07/16/2015    Past Surgical History:  Procedure Laterality Date  . ABDOMINAL SURGERY    . AV FISTULA PLACEMENT Left 09/19/2016   Procedure: Left arm  Radiocephalic ARTERIOVENOUS (AV) FISTULA CREATION;  Surgeon: Conrad Rincon, MD;  Location: [facility];  Service: Vascular;  Laterality: Left;  . BASCILIC VEIN TRANSPOSITION Left 07/09/2017   Procedure: BRACHIOCEPHALIC FISTULA CREATION;  Surgeon: Conrad Bradley, MD;  Location: [place];  Service: Vascular;  Laterality: Left;  . COLON SURGERY  2014  . INSERTION OF DIALYSIS CATHETER N/A 09/19/2016   Procedure: INSERTION OF TUNNELED DIALYSIS CATHETER;  Surgeon: Conrad [place], MD;  Location: [facility];  Service: Vascular;  Laterality: N/A;  . IR GENERIC HISTORICAL  09/14/2016   IR US GUIDE VASC ACCESS RIGHT 09/14/2016 [name], DO [facility]  . IR GENERIC HISTORICAL  09/14/2016   IR FLUORO GUIDE CV LINE RIGHT 09/14/2016 [name], DO [facility]  . ORIF TIBIA PLATEAU Left 01/15/2018   Procedure: OPEN REDUCTION INTERNAL FIXATION (ORIF) TIBIAL PLATEAU;  Surgeon: Altamese Leominster, MD;  Location: [facility];  Service: Orthopedics;  Laterality: Left;        Home Medications    Prior to Admission medications   Medication Sig Start Date End Date Taking? Authorizing Provider  albuterol (PROVENTIL HFA;VENTOLIN HFA) 108 (90 Base) MCG/ACT inhaler Inhale 2 puffs into the lungs 3 (three) times daily as needed for shortness of breath. 01/08/18   [provider]  amLODipine (NORVASC) 10 MG tablet Take 1 tablet (10 mg total) by mouth daily. 07/22/16   Elgergawy, Silver Huguenin, MD  amoxicillin-clavulanate (AUGMENTIN) 500-125 MG tablet Take 1  tablet (500 mg total) by mouth daily. Patient not taking: Reported on 03/04/2018 01/18/18   Kayleen Memos, DO  aspirin EC 325 MG tablet Take 1 tablet (325 mg total) by mouth daily. 01/18/18   Ainsley Spinner, PA-C  bacitracin ointment Apply topically daily. 01/19/18   Kayleen Memos, DO  calcium acetate (PHOSLO) 667 MG capsule Take 2 capsules (1,334 mg total) by mouth 3 (three) times daily with meals. 09/21/16   Lavina Hamman, MD  diltiazem (CARDIZEM CD) 360 MG 24 hr capsule Take 1 capsule by  mouth daily. 06/19/17   [provider]  oxyCODONE-acetaminophen (PERCOCET/ROXICET) 5-325 MG tablet Take 2 tablets by mouth every 4 (four) hours as needed for severe pain. 04/15/18   Okey Regal, PA-C    Family History Family History  Problem Relation Age of Onset  . Heart disease Mother        Died at age 57.  Marland Kitchen Heart failure Mother   . Kidney failure Sister     Social History Social History   Tobacco Use  . Smoking status: Former Smoker    Packs/day: 0.25    Types: Cigarettes    Last attempt to quit: 07/06/2017    Years since quitting: 0.7  . Smokeless tobacco: Never Used  Substance Use Topics  . Alcohol use: No    Alcohol/week: 4.2 oz    Types: 7 Cans of beer per week  . Drug use: Yes    Frequency: 1.0 times per week    Types: Marijuana, Heroin    Comment: snorts heroin, takes pain pills that he buys off the street     Allergies   No known allergies   Review of Systems Review of Systems  All other systems reviewed and are negative.    Physical Exam Updated Vital Signs BP (!) 180/100 (BP Location: Right Arm)   Pulse 78   Temp 98.4 F (36.9 C) (Oral)   Resp 16   Ht 6\' 1"  (1.854 m)   Wt 72.6 kg (160 lb)   SpO2 97%   BMI 21.11 kg/m   Physical Exam  Constitutional: He is oriented to person, place, and time. He appears well-developed and well-nourished.  HENT:  Head: Normocephalic and atraumatic.  Eyes: Pupils are equal, round, and reactive to light. Conjunctivae are normal. Right eye exhibits no discharge. Left eye exhibits no discharge. No scleral icterus.  Neck: Normal range of motion. No JVD present. No tracheal deviation present.  Pulmonary/Chest: Effort normal. No stridor.  Musculoskeletal:  Minor swelling noted over the dorsum of the right foot, no open wounds, tenderness palpation of the proximal first metatarsal, and first phalanx.  Neurological: He is alert and oriented to person, place, and time. Coordination normal.  Psychiatric: He  has a normal mood and affect. His behavior is normal. Judgment and thought content normal.  Nursing note and vitals reviewed.    ED Treatments / Results  Labs (all labs ordered are listed, but only abnormal results are displayed) Labs Reviewed - No data to display  EKG None  Radiology Dg Foot Complete Right  Result Date: 04/15/2018 CLINICAL DATA:  Right great toe pain after kicking a curb. EXAM: RIGHT FOOT COMPLETE - 3+ VIEW COMPARISON:  None. FINDINGS: Comminuted, mildly impacted fracture of the first proximal phalanx with mild lateral angulation. The fracture extends into the first MTP joint. Minimally displaced fracture at the base of the first metatarsal. No dislocation. Lisfranc alignment is maintained. Mild first MTP joint osteoarthritis. Bone mineralization  is normal. Mild soft tissue swelling of the great toe. IMPRESSION: 1. Comminuted, mildly impacted and angulated fracture of the first proximal phalanx. 2. Minimally displaced fracture through the base of the first metatarsal. Electronically Signed   By: Titus Dubin M.D.   On: 04/15/2018 17:19    Procedures Procedures (including critical care time)  Medications Ordered in ED Medications  acetaminophen (TYLENOL) tablet 650 mg (650 mg Oral Given 04/15/18 1651)  oxyCODONE (Oxy IR/ROXICODONE) immediate release tablet 5 mg (5 mg Oral Given 04/15/18 1743)     Initial Impression / Assessment and Plan / ED Course  I have reviewed the triage vital signs and the nursing notes.  Pertinent labs & imaging results that were available during my care of the patient were reviewed by me and considered in my medical decision making (see chart for details).     Labs:   Imaging: DG Foot complete   Consults:  Therapeutics: Oxycodone  Discharge Meds: Percocet  Assessment/Plan: 57 year old male presents today with foot fracture.  Patient has fracture of the first proximal phalanx and also mildly displaced fracture through the base of  the first metatarsal.  Patient will be placed in a boot, nonweightbearing with crutches with close outpatient orthopedic follow-up.  Patient given pain medication care instructions strict return precautions.  He verbalized understanding and agreement to today's plan had no further questions or concerns the time discharge.   Final Clinical Impressions(s) / ED Diagnoses   Final diagnoses:  Closed fracture of right foot, initial encounter    ED Discharge Orders        Ordered    oxyCODONE-acetaminophen (PERCOCET/ROXICET) 5-325 MG tablet  Every 4 hours PRN,   Status:  Discontinued     04/15/18 1830    oxyCODONE-acetaminophen (PERCOCET/ROXICET) 5-325 MG tablet  Every 4 hours PRN     04/15/18 1839       Francee Gentile 04/15/18 2031    Milton Ferguson, MD 04/18/18 1004

## 2018-04-15 NOTE — ED Notes (Signed)
Pt verbalizes understanding of d/c instructions. Pt received prescriptions. Pt taken to lobby in wheelchair at d/c with all belongings.   

## 2018-04-15 NOTE — Discharge Instructions (Addendum)
Please read attached information. If you experience any new or worsening signs or symptoms please return to the emergency room for evaluation. Please follow-up orthopedic specialist as discussed.  Please do not place weight on the foot until evaluation with orthopedics.  Please use medication prescribed only as directed and discontinue taking if you have any concerning signs or symptoms.

## 2018-04-15 NOTE — ED Notes (Signed)
Ortho tech paged  

## 2018-11-12 ENCOUNTER — Ambulatory Visit: Payer: Medicare Other | Admitting: Family

## 2018-11-20 ENCOUNTER — Other Ambulatory Visit: Payer: Self-pay | Admitting: Internal Medicine

## 2018-11-20 ENCOUNTER — Ambulatory Visit
Admission: RE | Admit: 2018-11-20 | Discharge: 2018-11-20 | Disposition: A | Discharge status: Home / Self Care | Payer: Medicare Other | Source: Ambulatory Visit | Attending: Internal Medicine | Admitting: Internal Medicine

## 2018-11-20 DIAGNOSIS — T1490XA Injury, unspecified, initial encounter: Secondary | ICD-10-CM

## 2018-11-20 DIAGNOSIS — R52 Pain, unspecified: Secondary | ICD-10-CM

## 2019-05-18 ENCOUNTER — Other Ambulatory Visit: Payer: Self-pay

## 2019-05-18 ENCOUNTER — Encounter (HOSPITAL_COMMUNITY): Payer: Self-pay | Admitting: Emergency Medicine

## 2019-05-18 ENCOUNTER — Emergency Department (HOSPITAL_COMMUNITY)
Admission: EM | Admit: 2019-05-18 | Discharge: 2019-05-19 | Disposition: A | Discharge status: Home / Self Care | Payer: Medicare Other | Attending: Emergency Medicine | Admitting: Emergency Medicine

## 2019-05-18 DIAGNOSIS — R7989 Other specified abnormal findings of blood chemistry: Secondary | ICD-10-CM | POA: Insufficient documentation

## 2019-05-18 DIAGNOSIS — Z79899 Other long term (current) drug therapy: Secondary | ICD-10-CM | POA: Insufficient documentation

## 2019-05-18 DIAGNOSIS — K5900 Constipation, unspecified: Secondary | ICD-10-CM

## 2019-05-18 DIAGNOSIS — I10 Essential (primary) hypertension: Secondary | ICD-10-CM

## 2019-05-18 DIAGNOSIS — Z992 Dependence on renal dialysis: Secondary | ICD-10-CM | POA: Diagnosis not present

## 2019-05-18 DIAGNOSIS — Z87891 Personal history of nicotine dependence: Secondary | ICD-10-CM | POA: Insufficient documentation

## 2019-05-18 DIAGNOSIS — R778 Other specified abnormalities of plasma proteins: Secondary | ICD-10-CM

## 2019-05-18 DIAGNOSIS — R1030 Lower abdominal pain, unspecified: Secondary | ICD-10-CM | POA: Diagnosis present

## 2019-05-18 DIAGNOSIS — I132 Hypertensive heart and chronic kidney disease with heart failure and with stage 5 chronic kidney disease, or end stage renal disease: Secondary | ICD-10-CM | POA: Diagnosis not present

## 2019-05-18 DIAGNOSIS — I5032 Chronic diastolic (congestive) heart failure: Secondary | ICD-10-CM | POA: Insufficient documentation

## 2019-05-18 DIAGNOSIS — N186 End stage renal disease: Secondary | ICD-10-CM | POA: Insufficient documentation

## 2019-05-18 LAB — COMPREHENSIVE METABOLIC PANEL
ALT: 28 U/L (ref 0–44)
AST: 38 U/L (ref 15–41)
Albumin: 3.7 g/dL (ref 3.5–5.0)
Alkaline Phosphatase: 52 U/L (ref 38–126)
Anion gap: 16 — ABNORMAL HIGH (ref 5–15)
BUN: 48 mg/dL — ABNORMAL HIGH (ref 6–20)
CO2: 23 mmol/L (ref 22–32)
Calcium: 8.9 mg/dL (ref 8.9–10.3)
Chloride: 94 mmol/L — ABNORMAL LOW (ref 98–111)
Creatinine, Ser: 8.67 mg/dL — ABNORMAL HIGH (ref 0.61–1.24)
GFR calc Af Amer: 7 mL/min — ABNORMAL LOW (ref >60)
GFR calc non Af Amer: 6 mL/min — ABNORMAL LOW (ref >60)
Glucose, Bld: 132 mg/dL — ABNORMAL HIGH (ref 70–99)
Potassium: 3.4 mmol/L — ABNORMAL LOW (ref 3.5–5.1)
Sodium: 133 mmol/L — ABNORMAL LOW (ref 135–145)
Total Bilirubin: 0.7 mg/dL (ref 0.3–1.2)
Total Protein: 7.5 g/dL (ref 6.5–8.1)

## 2019-05-18 LAB — CBC
HCT: 35 % — ABNORMAL LOW (ref 39.0–52.0)
Hemoglobin: 11.7 g/dL — ABNORMAL LOW (ref 13.0–17.0)
MCH: 30.2 pg (ref 26.0–34.0)
MCHC: 33.4 g/dL (ref 30.0–36.0)
MCV: 90.4 fL (ref 80.0–100.0)
Platelets: 275 10*3/uL (ref 150–400)
RBC: 3.87 MIL/uL — ABNORMAL LOW (ref 4.22–5.81)
RDW: 14.2 % (ref 11.5–15.5)
WBC: 8.1 10*3/uL (ref 4.0–10.5)
nRBC: 0 % (ref 0.0–0.2)

## 2019-05-18 LAB — LIPASE, BLOOD: Lipase: 178 U/L — ABNORMAL HIGH (ref 11–51)

## 2019-05-18 MED ORDER — HYDROMORPHONE HCL 1 MG/ML IJ SOLN
1.0000 mg | Freq: Once | INTRAMUSCULAR | Status: AC
  Administered 2019-05-18: 1 mg via INTRAVENOUS
  Filled 2019-05-18: qty 1

## 2019-05-18 NOTE — ED Notes (Signed)
Pt states Fentanyl given by EMS didn't work, pt is requesting pain medication at this time.

## 2019-05-18 NOTE — ED Provider Notes (Signed)
[facility] EMERGENCY DEPARTMENT Provider Note   CSN: [id number] Arrival date & time: 05/18/19  2215    History   Chief Complaint Chief Complaint  Patient presents with   Abdominal Pain    HPI Timothy [name] is a 58 y.o. male with a history of ESRD on HD (T/R/S), HTN, colon cancer in remission s/p left hemi-colectomy who presents to the emergency department with a chief complaint of abdominal pain.  The patient endorses bilateral lower abdominal pain, onset today.  He characterizes the pain as crampy and "feeling like I need to go to the bathroom."  No known aggravating or alleviating factors.  Reports that he had a normal bowel movement earlier today.  He has been passing flatus.  He denies fever, chills, nausea, vomiting, diarrhea, back pain, penile or testicular pain or swelling.  He received 100 mcg of fentanyl in route by EMS with no improvement in his symptoms.  He is on chronic pain medication.  No improvement with his symptoms with his home medication.   He does not make urine.  He has been compliant with dialysis and was last dialyzed yesterday.      The history is provided by the patient. No language interpreter was used.    Past Medical History:  Diagnosis Date   Acute diastolic heart failure ([place]) 07/17/2016   BPH (benign prostatic hyperplasia)    Chronic kidney disease    Colon cancer ([place]) 2014   Elevated troponin    Hypertension     Patient Active Problem List   Diagnosis Date Noted   Open wound of left hand 01/18/2018   Tibial fracture 01/14/2018   Tibial plateau fracture, left 01/13/2018   Malnutrition of moderate degree 09/20/2016   AKI (acute kidney injury) ([place])    History of colon cancer    Hyponatremia    Leukocytosis    Acute blood loss anemia    Acute metabolic encephalopathy 28/31/5176   ESRD on dialysis ([place]) 09/09/2016   Hypertensive emergency 09/09/2016   Acute respiratory failure with hypoxia ([place])  09/09/2016   Acute pulmonary edema ([place]) 09/09/2016   Polysubstance abuse ([place]) 09/09/2016   Nonadherence to medical treatment 09/09/2016   Anemia due to chronic kidney disease 09/09/2016   Altered mental status 09/09/2016   Acute respiratory failure ([place])    Drug ingestion    Hypertensive heart disease 07/19/2016   Chest pain    Elevated troponin 16/04/3709   Acute diastolic heart failure, NYHA class 2 ([place]) 07/17/2016   Heroin abuse ([place]) 07/17/2016   HTN (hypertension) 07/17/2016   Benign prostate hyperplasia 07/16/2015    Past Surgical History:  Procedure Laterality Date   ABDOMINAL SURGERY     AV FISTULA PLACEMENT Left 09/19/2016   Procedure: Left arm Radiocephalic ARTERIOVENOUS (AV) FISTULA CREATION;  Surgeon: Conrad Viburnum, MD;  Location: [facility];  Service: Vascular;  Laterality: Left;   [place] Left 07/09/2017   Procedure: BRACHIOCEPHALIC FISTULA CREATION;  Surgeon: Conrad Wauregan, MD;  Location: [facility];  Service: Vascular;  Laterality: Left;   COLON SURGERY  2014   INSERTION OF DIALYSIS CATHETER N/A 09/19/2016   Procedure: INSERTION OF TUNNELED DIALYSIS CATHETER;  Surgeon: Conrad Neilton, MD;  Location: [facility];  Service: Vascular;  Laterality: N/A;   IR GENERIC HISTORICAL  09/14/2016   IR US GUIDE VASC ACCESS RIGHT 09/14/2016 [name], DO [facility]   IR GENERIC HISTORICAL  09/14/2016   IR FLUORO GUIDE CV LINE RIGHT 09/14/2016  [name], DO [facility]   ORIF TIBIA PLATEAU Left 01/15/2018   Procedure: OPEN REDUCTION INTERNAL FIXATION (ORIF) TIBIAL PLATEAU;  Surgeon: Altamese Cambrian Park, MD;  Location: [facility];  Service: Orthopedics;  Laterality: Left;        Home Medications    Prior to Admission medications   Medication Sig Start Date End Date Taking? Authorizing Provider  albuterol (PROVENTIL HFA;VENTOLIN HFA) 108 (90 Base) MCG/ACT inhaler Inhale 2 puffs into the lungs 3 (three) times daily as needed for shortness of breath.  01/08/18  Yes [provider]  amLODipine (NORVASC) 10 MG tablet Take 1 tablet (10 mg total) by mouth daily. 07/22/16  Yes Elgergawy, Silver Huguenin, MD  diltiazem (CARDIZEM CD) 360 MG 24 hr capsule Take 1 capsule by mouth daily. 06/19/17  Yes [provider]  oxyCODONE-acetaminophen (PERCOCET/ROXICET) 5-325 MG tablet Take 2 tablets by mouth every 4 (four) hours as needed for severe pain. 04/15/18  Yes Hedges, Dellis Filbert, PA-C  bisacodyl (DULCOLAX) 5 MG EC tablet Take 1 tablet (5 mg total) by mouth daily as needed for moderate constipation or severe constipation. 05/19/19   Kenyatte Gruber A, PA-C  docusate sodium (COLACE) 250 MG capsule Take 1 capsule (250 mg total) by mouth daily. 05/19/19   Milbern Doescher A, PA-C  lactulose (CEPHULAC) 10 g packet Take 1 packet (10 g total) by mouth 3 (three) times daily. 05/19/19   Omar Gayden A, PA-C    Family History Family History  Problem Relation Age of Onset   Heart disease Mother        Died at age 47.   Heart failure Mother    Kidney failure Sister     Social History Social History   Tobacco Use   Smoking status: Former Smoker    Packs/day: 0.25    Types: Cigarettes    Quit date: 07/06/2017    Years since quitting: 1.8   Smokeless tobacco: Never Used  Substance Use Topics   Alcohol use: No    Alcohol/week: 7.0 standard drinks    Types: 7 Cans of beer per week   Drug use: Yes    Frequency: 1.0 times per week    Types: Marijuana, Heroin    Comment: snorts heroin, takes pain pills that he buys off the street     Allergies   No known allergies   Review of Systems Review of Systems  Constitutional: Negative for appetite change, chills and fever.  HENT: Negative for congestion and sore throat.   Respiratory: Negative for apnea, cough, shortness of breath and wheezing.   Cardiovascular: Negative for chest pain, palpitations and leg swelling.  Gastrointestinal: Positive for abdominal pain. Negative for constipation, diarrhea,  nausea and vomiting.  Genitourinary: Negative for dysuria, flank pain, frequency, penile pain, penile swelling, scrotal swelling and testicular pain.  Musculoskeletal: Negative for back pain.  Skin: Negative for rash.  Allergic/Immunologic: Negative for immunocompromised state.  Neurological: Negative for dizziness, seizures, syncope, weakness and headaches.  Psychiatric/Behavioral: Negative for confusion.     Physical Exam Updated Vital Signs BP (!) 185/89    Pulse 78    Temp 98.3 F (36.8 C) (Oral)    Resp (!) 9    Ht 6\' 1"  (1.854 m)    Wt 72.6 kg    SpO2 95%    BMI 21.11 kg/m   Physical Exam Vitals signs and nursing note reviewed.  Constitutional:      General: He is in acute distress.     Appearance: He is  well-developed. He is not ill-appearing, toxic-appearing or diaphoretic.     Comments: Writhing around in the bed and moaning in pain  HENT:     Head: Normocephalic.  Eyes:     Conjunctiva/sclera: Conjunctivae normal.  Neck:     Musculoskeletal: Neck supple.  Cardiovascular:     Rate and Rhythm: Normal rate and regular rhythm.     Heart sounds: No murmur.  Pulmonary:     Effort: Pulmonary effort is normal. No respiratory distress.     Breath sounds: No stridor. No wheezing, rhonchi or rales.  Chest:     Chest wall: No tenderness.  Abdominal:     General: There is no distension.     Palpations: Abdomen is soft. There is no mass.     Tenderness: There is abdominal tenderness. There is guarding. There is no right CVA tenderness, left CVA tenderness or rebound.     Hernia: No hernia is present.     Comments: Diffuse tenderness to palpation throughout the abdomen.  Abdomen is distended, but soft, and patient is writhing around the bed.  There is voluntary guarding.  No focal tenderness.  No CVA tenderness bilaterally.  Skin:    General: Skin is warm and dry.     Capillary Refill: Capillary refill takes less than 2 seconds.     Coloration: Skin is not jaundiced.      Findings: No bruising.  Neurological:     Mental Status: He is alert.  Psychiatric:        Behavior: Behavior normal.      ED Treatments / Results  Labs (all labs ordered are listed, but only abnormal results are displayed) Labs Reviewed  LIPASE, BLOOD - Abnormal; Notable for the following components:      Result Value   Lipase 178 (*)    All other components within normal limits  COMPREHENSIVE METABOLIC PANEL - Abnormal; Notable for the following components:   Sodium 133 (*)    Potassium 3.4 (*)    Chloride 94 (*)    Glucose, Bld 132 (*)    BUN 48 (*)    Creatinine, Ser 8.67 (*)    GFR calc non Af Amer 6 (*)    GFR calc Af Amer 7 (*)    Anion gap 16 (*)    All other components within normal limits  CBC - Abnormal; Notable for the following components:   RBC 3.87 (*)    Hemoglobin 11.7 (*)    HCT 35.0 (*)    All other components within normal limits  LACTIC ACID, PLASMA - Abnormal; Notable for the following components:   Lactic Acid, Venous 2.0 (*)    All other components within normal limits  TROPONIN I (HIGH SENSITIVITY) - Abnormal; Notable for the following components:   Troponin I (High Sensitivity) 35 (*)    All other components within normal limits  TROPONIN I (HIGH SENSITIVITY) - Abnormal; Notable for the following components:   Troponin I (High Sensitivity) 33 (*)    All other components within normal limits  LACTIC ACID, PLASMA    EKG EKG Interpretation  Date/Time:  Monday May 19 2019 02:35:15 EDT Ventricular Rate:  75 PR Interval:    QRS Duration: 100 QT Interval:  403 QTC Calculation: 451 R Axis:   59 Text Interpretation:  Sinus rhythm Anteroseptal infarct, old Confirmed by Randal Buba, April ([id number]) on 05/19/2019 3:14:35 AM Also confirmed by Randal Buba, April ([id number]), editor Oswaldo Milian, Beverly ([id number])  on 05/19/2019 7:21:02 AM  Radiology Ct Angio Abd/pel W And/or Wo Contrast  Result Date: 05/19/2019 CLINICAL DATA:  Right lower quadrant abdominal pain.  History of colon cancer. EXAM: CTA ABDOMEN AND PELVIS wITHOUT AND WITH CONTRAST TECHNIQUE: Multidetector CT imaging of the abdomen and pelvis was performed using the standard protocol during bolus administration of intravenous contrast. Multiplanar reconstructed images and MIPs were obtained and reviewed to evaluate the vascular anatomy. CONTRAST:  170mL OMNIPAQUE IOHEXOL 350 MG/ML SOLN COMPARISON:  None. FINDINGS: VASCULAR Aorta: Normal caliber aorta without aneurysm, dissection, vasculitis or significant stenosis. There is mild diffuse calcific atherosclerosis. Celiac: Patent without evidence of aneurysm, dissection, vasculitis or significant stenosis. SMA: Patent without evidence of aneurysm, dissection, vasculitis or significant stenosis. Renals: Both renal arteries are patent without evidence of aneurysm, dissection, vasculitis, fibromuscular dysplasia or significant stenosis. IMA: Poor opacification of the origin of the IMA. Normal opacification distally. Inflow: Patent without evidence of aneurysm, dissection, vasculitis or significant stenosis. Proximal Outflow: Bilateral common femoral and visualized portions of the superficial and profunda femoral arteries are patent without evidence of aneurysm, dissection, vasculitis or significant stenosis. Veins: No obvious venous abnormality within the limitations of this arterial phase study. Review of the MIP images confirms the above findings. NON-VASCULAR Lower chest: No acute abnormality. Hepatobiliary: No focal liver abnormality is seen. There is cholelithiasis with no gallbladder wall thickening, or biliary dilatation. Pancreas: Unremarkable. No pancreatic ductal dilatation or surrounding inflammatory changes. Spleen: Normal in size without focal abnormality. Adrenals/Urinary Tract: Bilateral nephrolithiasis without hydronephrosis. Stomach/Bowel: There are postsurgical changes within the right abdomen. Status post right hemicolectomy. There is a large amount of  stool within the descending and sigmoid colon. Lymphatic: Aortic atherosclerosis. No enlarged abdominal or pelvic lymph nodes. Reproductive: The prostate is mildly enlarged, measuring 5 cm in transverse dimension. Other: None Musculoskeletal: No acute or significant osseous findings. IMPRESSION: VASCULAR 1. No occlusion or stenosis of the celiac axis or superior mesenteric artery. 2. Poor opacification of the inferior mesenteric artery origin, which is otherwise normal. 3.  Aortic atherosclerosis (ICD10-I70.0). NON-VASCULAR 1. No pneumatosis coli or other secondary finding of bowel ischemia. 2. Large amount of stool within the descending colon. Status post right hemicolectomy. Electronically Signed   By: Ulyses Jarred M.D.   On: 05/19/2019 02:28    Procedures Procedures (including critical care time)  Medications Ordered in ED Medications  HYDROmorphone (DILAUDID) injection 1 mg (1 mg Intravenous Given 05/18/19 2351)  dicyclomine (BENTYL) injection 20 mg (20 mg Intramuscular Given 05/19/19 0109)  ondansetron (ZOFRAN) injection 4 mg (4 mg Intravenous Given 05/19/19 0108)  iohexol (OMNIPAQUE) 350 MG/ML injection 100 mL (100 mLs Intravenous Contrast Given 05/19/19 0148)  hydrALAZINE (APRESOLINE) injection 5 mg (5 mg Intravenous Given 05/19/19 0211)  morphine 4 MG/ML injection 4 mg (4 mg Intravenous Given 05/19/19 0233)  hydrALAZINE (APRESOLINE) injection 5 mg (5 mg Intravenous Given 05/19/19 0400)  ketorolac (TORADOL) 15 MG/ML injection 15 mg (15 mg Intravenous Given 05/19/19 0359)  amLODipine (NORVASC) tablet 10 mg (10 mg Oral Given 05/19/19 0457)     Initial Impression / Assessment and Plan / ED Course  I have reviewed the triage vital signs and the nursing notes.  Pertinent labs & imaging results that were available during my care of the patient were reviewed by me and considered in my medical decision making (see chart for details).        58 year old male with a history of ESRD on HD (T/R/S), HTN, colon  cancer in remission s/p left hemi-colectomy presenting by EMS with  severe abdominal pain.  No other associated symptoms.  States the pain feels similar to needing to have a bowel movement.  He is hypertensive on arrival, but this appears consistent with previous ER and outpatient visits over the last year.  Labs are notable for a lipase of 0.78, which is new.  This could be secondary to opioid medication use.  Minimal anion gap of 16.  Hemoglobin is stable at 11.7.  Lactate is 1.5.  The patient was discussed with Dr. Clydell Hakim, attending physician, who independently evaluated the patient.  Given hypertension and pain out of proportion on exam to the abdomen, I am concerned about mesenteric ischemia.  Will order CT angios of the abdomen and pelvis.  Symptoms could be secondary to hypertensive urgency versus emergency.  Will order troponin.  Patient does not make urine symptoms of any leukocytes.  He denies a headache at this time.  CT with a large amount of stool in the descending colon.  No evidence of obstruction.    Given concern for hypertensive urgency emergency, troponin was elevated.  Patient previously had a history of elevated troponin, but testing was recently changed to high-sensitivity troponin.  I am uncertain of how the correlation between high-sensitivity troponin and previous troponin testing orally.  Because of this, will order delta troponin.  Initial high-sensitivity troponin was 35 and repeat is 33.  I suspect this is elevated secondary to ESRD.  He was given 2 doses of hydralazine as well as his home dose of Norvasc prior to discharge.  Notified by nursing staff that patient was upset regarding poor pain control.  I reevaluated the patient and we discussed the multiple attempts via different medications for pain control.  Since pain is likely secondary to constipation, enema was ordered in the ER.  He did not have a bowel movement.  At this time, the patient is agreeable to  discharge home.  Will provide the patient with a bowel regimen Dulcolax, lactulose, and Colace, which are safe for ESRD.  I advised the patient to follow-up with primary care as he is on chronic opioids, and for reevaluation of his blood pressure.  He was also strongly advised to ensure that he is dialyzed during his session in 24 hours given that he received IV contrast in the ER and Toradol in an attempt for pain control after discussing these medications with Dr. Nicholes Stairs Rasch.  Return precautions to the ER given.  For discharge home with outpatient follow-up.    Final Clinical Impressions(s) / ED Diagnoses   Final diagnoses:  Constipation, unspecified constipation type  Elevated troponin  Hypertension, unspecified type    ED Discharge Orders         Ordered    bisacodyl (DULCOLAX) 5 MG EC tablet  Daily PRN     05/19/19 0431    lactulose (CEPHULAC) 10 g packet  3 times daily     05/19/19 0431    docusate sodium (COLACE) 250 MG capsule  Daily     05/19/19 0431           Joanne Gavel, PA-C 05/19/19 0939    Palumbo, April, MD 05/19/19 2334

## 2019-05-18 NOTE — ED Triage Notes (Signed)
Patient arrived with EMS from home reports RLQ abdominal pain onset this evening , he received Fentanyl 100 mcg by EMS , no emesis or diarrhea , denies fever or chills . Hemodialysis q Tue/Thur/Sat .

## 2019-05-18 NOTE — ED Notes (Signed)
Pt states he doesn't make any urine, pt is HD pt Tuesday, Thursday, Saturday, last HD treatment yesterday.

## 2019-05-19 ENCOUNTER — Emergency Department (HOSPITAL_COMMUNITY): Payer: Medicare Other

## 2019-05-19 DIAGNOSIS — K5900 Constipation, unspecified: Secondary | ICD-10-CM | POA: Diagnosis not present

## 2019-05-19 LAB — TROPONIN I (HIGH SENSITIVITY)
Troponin I (High Sensitivity): 35 ng/L — ABNORMAL HIGH (ref <18)
Troponin I (High Sensitivity): 33 ng/L — ABNORMAL HIGH (ref <18)

## 2019-05-19 LAB — LACTIC ACID, PLASMA
Lactic Acid, Venous: 1.5 mmol/L (ref 0.5–1.9)
Lactic Acid, Venous: 2 mmol/L (ref 0.5–1.9)

## 2019-05-19 MED ORDER — DICYCLOMINE HCL 10 MG/ML IM SOLN
20.0000 mg | Freq: Once | INTRAMUSCULAR | Status: AC
  Administered 2019-05-19: 20 mg via INTRAMUSCULAR
  Filled 2019-05-19: qty 2

## 2019-05-19 MED ORDER — KETOROLAC TROMETHAMINE 15 MG/ML IJ SOLN
15.0000 mg | Freq: Once | INTRAMUSCULAR | Status: AC
  Administered 2019-05-19: 15 mg via INTRAVENOUS
  Filled 2019-05-19: qty 1

## 2019-05-19 MED ORDER — AMLODIPINE BESYLATE 5 MG PO TABS
10.0000 mg | ORAL_TABLET | Freq: Once | ORAL | Status: AC
  Administered 2019-05-19: 10 mg via ORAL
  Filled 2019-05-19: qty 2

## 2019-05-19 MED ORDER — LACTULOSE 10 G PO PACK: 10.0000 g | PACK | Freq: Three times a day (TID) | ORAL | 0 refills | Status: DC

## 2019-05-19 MED ORDER — BISACODYL 5 MG PO TBEC: 5.0000 mg | DELAYED_RELEASE_TABLET | Freq: Every day | ORAL | 0 refills | Status: DC | PRN

## 2019-05-19 MED ORDER — HYDRALAZINE HCL 20 MG/ML IJ SOLN
5.0000 mg | Freq: Once | INTRAMUSCULAR | Status: AC
  Administered 2019-05-19: 5 mg via INTRAVENOUS
  Filled 2019-05-19: qty 1

## 2019-05-19 MED ORDER — ONDANSETRON HCL 4 MG/2ML IJ SOLN
4.0000 mg | Freq: Once | INTRAMUSCULAR | Status: AC
  Administered 2019-05-19: 4 mg via INTRAVENOUS
  Filled 2019-05-19: qty 2

## 2019-05-19 MED ORDER — IOHEXOL 350 MG/ML SOLN
100.0000 mL | Freq: Once | INTRAVENOUS | Status: AC | PRN
  Administered 2019-05-19: 100 mL via INTRAVENOUS

## 2019-05-19 MED ORDER — DOCUSATE SODIUM 250 MG PO CAPS: 250.0000 mg | ORAL_CAPSULE | Freq: Every day | ORAL | 0 refills | Status: DC

## 2019-05-19 MED ORDER — MORPHINE SULFATE (PF) 4 MG/ML IV SOLN
4.0000 mg | Freq: Once | INTRAVENOUS | Status: AC
  Administered 2019-05-19: 4 mg via INTRAVENOUS
  Filled 2019-05-19: qty 1

## 2019-05-19 NOTE — ED Notes (Signed)
Date and time results received: 05/19/19 0200 (use smartphrase ".now" to insert current time)  Test: Lactic Acid Critical Value: 2.0  Name of Provider Notified: Joline Maxcy

## 2019-05-19 NOTE — ED Notes (Signed)
Patient transported to CT 

## 2019-05-19 NOTE — Discharge Instructions (Addendum)
Thank you for allowing me to care for you today in the Emergency Department.   You were treated with multiple multiple medications for pain today, including Dilaudid, morphine, fentanyl, and Toradol.  It is very important that you keep your dialysis appointment on Tuesday since some of the medications that you were given in the ER today needs to be removed by dialysis.  Your work-up was unremarkable for severe constipation.  Your imaging did not show any evidence of a bowel obstruction.  Since you are on a chronic pain medication at home, this could be contributing to your constipation.  He should talk with your primary care provider to see if there is any daily medication that you can take to prevent severe constipation.  To treat constipation at home:  -Take lactulose by mouth up to 3 times daily as needed. - Take 1 tablet of Dulcolax by mouth  -Take 1 capsule of Colace by mouth daily  Uses medications as needed until you are having soft bowel movements.  Follow-up with primary care if you do not have a bowel movement in the next 2 to 3 days.  Return to the emergency department if you do not have a bowel movement and you stop passing gas, you develop high fevers, you develop persistent vomiting, or if you develop other new, concerning symptoms.

## 2019-05-19 NOTE — ED Notes (Signed)
Pt c/o abdominal pain and asked for some pain meds.

## 2019-05-19 NOTE — ED Notes (Signed)
Delay in lab draw,  Pt currently in CT

## 2019-12-08 ENCOUNTER — Ambulatory Visit
Admission: RE | Admit: 2019-12-08 | Discharge: 2019-12-08 | Disposition: A | Discharge status: Home / Self Care | Payer: Medicare Other | Source: Ambulatory Visit | Attending: Family Medicine | Admitting: Family Medicine

## 2019-12-08 ENCOUNTER — Other Ambulatory Visit: Payer: Self-pay | Admitting: Family Medicine

## 2019-12-08 DIAGNOSIS — K5900 Constipation, unspecified: Secondary | ICD-10-CM

## 2020-05-19 ENCOUNTER — Other Ambulatory Visit: Payer: Self-pay | Admitting: General Practice

## 2020-05-19 ENCOUNTER — Ambulatory Visit
Admission: RE | Admit: 2020-05-19 | Discharge: 2020-05-19 | Disposition: A | Discharge status: Home / Self Care | Payer: Medicare (Managed Care) | Source: Ambulatory Visit | Attending: General Practice | Admitting: General Practice

## 2020-05-19 DIAGNOSIS — R053 Chronic cough: Secondary | ICD-10-CM

## 2020-08-12 ENCOUNTER — Telehealth: Payer: Self-pay

## 2020-08-12 NOTE — Telephone Encounter (Signed)
Notes on file from [facility], [place] referral to scheduling.

## 2020-08-18 ENCOUNTER — Ambulatory Visit: Payer: Medicare (Managed Care)

## 2020-08-27 ENCOUNTER — Encounter: Payer: Self-pay | Admitting: General Practice

## 2020-10-01 ENCOUNTER — Other Ambulatory Visit: Payer: Self-pay | Admitting: Nephrology

## 2020-10-01 ENCOUNTER — Ambulatory Visit
Admission: RE | Admit: 2020-10-01 | Discharge: 2020-10-01 | Disposition: A | Discharge status: Home / Self Care | Payer: Medicare (Managed Care) | Source: Ambulatory Visit | Attending: Nephrology | Admitting: Nephrology

## 2020-10-01 ENCOUNTER — Other Ambulatory Visit: Payer: Self-pay | Admitting: General Practice

## 2020-10-01 DIAGNOSIS — Z87891 Personal history of nicotine dependence: Secondary | ICD-10-CM

## 2020-10-01 DIAGNOSIS — R0600 Dyspnea, unspecified: Secondary | ICD-10-CM

## 2020-10-01 DIAGNOSIS — F17210 Nicotine dependence, cigarettes, uncomplicated: Secondary | ICD-10-CM

## 2020-10-01 DIAGNOSIS — N186 End stage renal disease: Secondary | ICD-10-CM

## 2020-10-25 ENCOUNTER — Other Ambulatory Visit: Payer: Self-pay

## 2020-10-25 ENCOUNTER — Ambulatory Visit (INDEPENDENT_AMBULATORY_CARE_PROVIDER_SITE_OTHER): Payer: Medicare (Managed Care) | Admitting: Cardiology

## 2020-10-25 ENCOUNTER — Encounter: Payer: Self-pay | Admitting: Cardiology

## 2020-10-25 VITALS — BP 152/84 | HR 63 | Ht 73.0 in | Wt 154.8 lb

## 2020-10-25 DIAGNOSIS — F1910 Other psychoactive substance abuse, uncomplicated: Secondary | ICD-10-CM

## 2020-10-25 DIAGNOSIS — I10 Essential (primary) hypertension: Secondary | ICD-10-CM

## 2020-10-25 DIAGNOSIS — R0600 Dyspnea, unspecified: Secondary | ICD-10-CM

## 2020-10-25 DIAGNOSIS — R0609 Other forms of dyspnea: Secondary | ICD-10-CM | POA: Insufficient documentation

## 2020-10-25 DIAGNOSIS — R0601 Orthopnea: Secondary | ICD-10-CM | POA: Insufficient documentation

## 2020-10-25 DIAGNOSIS — I132 Hypertensive heart and chronic kidney disease with heart failure and with stage 5 chronic kidney disease, or end stage renal disease: Secondary | ICD-10-CM | POA: Diagnosis not present

## 2020-10-25 NOTE — Progress Notes (Signed)
Primary Care Provider: [name], MD Cardiologist: No primary care provider on file. Electrophysiologist: None  Clinic Note: Chief Complaint  Patient presents with  . New Patient (Initial Visit)  . Shortness of Breath    ASSESSMENT/PLAN    Problem List Items Addressed This Visit    Hypertensive heart and chronic kidney disease with heart failure and with stage 5 chronic kidney disease, or end stage renal disease ([place]) - Primary (Chronic)    Blood pressure still not adequately controlled, but he has hypotension during dialysis which precludes aggressive treatment.  He is on combination of amlodipine and diltiazem-IV diltiazem he is long ago because of concern of possible cocaine use.  Previous echocardiogram was relatively normal, but the extent of dyspnea he is having for now would like to go ahead and proceed with echocardiogram.  Plan: Echocardiogram      Relevant Orders   EKG 12-Lead (Completed)   ECHOCARDIOGRAM COMPLETE   Hepatic function panel   Lipid panel   Hepatic function panel   Lipid panel   DOE (dyspnea on exertion) (Chronic)    Exertional dyspnea.  Previous cardiac 2D echo and based on that decide whether we would proceed with cardiac catheterization grossly abnormal versus coronary CT angiogram if no wall motion abnormality or reduced EF.  Also cannot exclude underlying COPD.  With potential for CAD, we will check lipid panel and chemistry panel.      Relevant Orders   EKG 12-Lead (Completed)   ECHOCARDIOGRAM COMPLETE   Hepatic function panel   Lipid panel   Hepatic function panel   Lipid panel   HTN (hypertension) (Chronic)    Poorly controlled, but with hypotension during dialysis some leery of being too aggressive.  He is being followed by a nephrologist, and ostensibly is having his blood pressure control as well.  Suspect we could potentially convert diltiazem to carvedilol if there is still no concern with cocaine use.       Polysubstance abuse ([place]) (Chronic)    He says he has been abstinent, however cannot exclude potential cardiomyopathy from previous use.  Plan: Check 2D echo      Orthopnea   Relevant Orders   EKG 12-Lead (Completed)   ECHOCARDIOGRAM COMPLETE   Hepatic function panel   Lipid panel   Hepatic function panel   Lipid panel     --------------------------------------------------------------------------------  HPI:    Timothy [name] is a 60 y.o. male with a ESRD-HD (on HD x 3-4 yrs), longstanding HTN (dx @ 85) -Hypertensive Heart Disease (with hypotension during dialysis), Polysubstance Abuse ([place] & EtOH/Tobacco),  & H/o Colon Cancer who is being seen today for the evaluation of SHORTNESS OF BREATH at the request of Edrick Oh, MD.  Timothy [name] was seen in consultation back in October 2017 when he presented with hypertensive emergency with elevated troponin levels and acute diastolic heart failure.  At that time he acknowledged the events during heroin as well as smoking significant past tobacco and drinking alcohol. => At that time he noted exertional chest pain as well as progressive dyspnea and cough.  -> ended up on HD.; EF 60-65%, No RWMA w/ Gr 2 DD; + Troponin related to demand ischemia.     Recent Hospitalizations: No recent hospitalizations  Reviewed  CV studies:    The following studies were reviewed today: (if available, images/films reviewed: From Epic Chart or Care Everywhere) . ECHO 07/2016:  EF 60-65%, No RWMA. Mod Concentric LVH - Gr 2 DD. Severe LA  dilation. PAP ~35 mmHg (mild Pulm HTN)  . ECHO 08/2016: - NO CHANGE - ? Mod LA dilation.    Interval History:   Timothy [name] is not really sure why he is here today.  The main issue is that he has shortness of breath with exertion but is ongoing for quite a while.  He denies any associated chest pain or pressure.  He says he is short of breath with minimal walking simply walking into the clinic room from the waiting  area caused dyspnea.  He really does not have any PND, orthopnea, with only mild ankle edema.  He says he is always dizzy and lightheaded sometimes worse with standing.  Usually the worst is postdialysis.  He has occasional fluttering sensation in his chest that he associates with of panic attacks.  He says his blood pressures go down with dialysis but normalize before he leaves.  He really isn't all that active  CV Review of Symptoms (Summary): positive for - dyspnea on exertion, edema, irregular heartbeat and Lightheadedness/dizziness negative for - chest pain, orthopnea, paroxysmal nocturnal dyspnea, rapid heart rate, shortness of breath or Syncope/near syncope or TIA/amaurosis fugax, claudication  The patient does not have symptoms concerning for COVID-19 infection (fever, chills, cough, or new shortness of breath).   REVIEWED OF SYSTEMS   Review of Systems  Constitutional: Positive for malaise/fatigue. Negative for weight loss.  HENT: Negative for congestion and nosebleeds.   Respiratory: Positive for cough and shortness of breath. Negative for wheezing.   Cardiovascular: Positive for leg swelling.  Gastrointestinal: Negative for blood in stool, constipation and melena.  Genitourinary: Negative.   Musculoskeletal: Negative for back pain, falls and joint pain.  Neurological: Positive for dizziness. Negative for tingling, speech change, focal weakness and headaches.  Psychiatric/Behavioral: Positive for memory loss and substance abuse (He says none recently). Negative for depression. The patient does not have insomnia.    I have reviewed and (if needed) personally updated the patient's problem list, medications, allergies, past medical and surgical history, social and family history.   PAST MEDICAL HISTORY   Past Medical History:  Diagnosis Date  . Anemia of chronic kidney failure   . BPH (benign prostatic hyperplasia)   . Colon cancer ([place]) 2014  . End stage renal disease on  dialysis ([place]) 2017  . Hypertension   . Hypertensive heart disease with chronic diastolic congestive heart failure ([place]) 07/17/2016  . Polysubstance abuse ([place])    History of heroin and marijuana use    PAST SURGICAL HISTORY   Past Surgical History:  Procedure Laterality Date  . ABDOMINAL SURGERY    . AV FISTULA PLACEMENT Left 09/19/2016   Procedure: Left arm Radiocephalic ARTERIOVENOUS (AV) FISTULA CREATION;  Surgeon: Conrad Kenosha, MD;  Location: [facility];  Service: Vascular;  Laterality: Left;  . BASCILIC VEIN TRANSPOSITION Left 07/09/2017   Procedure: BRACHIOCEPHALIC FISTULA CREATION;  Surgeon: Conrad Ord, MD;  Location: [facility];  Service: Vascular;  Laterality: Left;  . COLON SURGERY  2014  . INSERTION OF DIALYSIS CATHETER N/A 09/19/2016   Procedure: INSERTION OF TUNNELED DIALYSIS CATHETER;  Surgeon: Conrad Wardell, MD;  Location: [facility];  Service: Vascular;  Laterality: N/A;  . IR GENERIC HISTORICAL  09/14/2016   IR US GUIDE VASC ACCESS RIGHT 09/14/2016 [name], DO [facility]  . IR GENERIC HISTORICAL  09/14/2016   IR FLUORO GUIDE CV LINE RIGHT 09/14/2016 [name], DO [facility]  . ORIF TIBIA PLATEAU Left 01/15/2018   Procedure:  OPEN REDUCTION INTERNAL FIXATION (ORIF) TIBIAL PLATEAU;  Surgeon: Altamese Carterville, MD;  Location: [facility];  Service: Orthopedics;  Laterality: Left;  . TRANSTHORACIC ECHOCARDIOGRAM  07/2016    EF 60-65%, No RWMA. Mod Concentric LVH - Gr 2 DD. Severe LA dilation. PAP ~35 mmHg (mild Pulm HTN)  --> no changes noted 1 month later    Immunization History  Administered Date(s) Administered  . Influenza,inj,Quad PF,6+ Mos 09/13/2015  . PPD Test 09/13/2016  . Tdap 09/13/2015, 01/13/2018    MEDICATIONS/ALLERGIES   Current Meds  Medication Sig  . albuterol (PROVENTIL HFA;VENTOLIN HFA) 108 (90 Base) MCG/ACT inhaler Inhale 2 puffs into the lungs 3 (three) times daily as needed for shortness of breath.  Marland Kitchen amLODipine (NORVASC) 10 MG tablet Take 1 tablet (10  mg total) by mouth daily.  Marland Kitchen diltiazem (CARDIZEM CD) 360 MG 24 hr capsule Take 1 capsule by mouth daily.  Marland Kitchen docusate sodium (COLACE) 250 MG capsule Take 1 capsule (250 mg total) by mouth daily.  Marland Kitchen lactulose (CEPHULAC) 10 g packet Take 1 packet (10 g total) by mouth 3 (three) times daily.  Marland Kitchen oxyCODONE-acetaminophen (PERCOCET/ROXICET) 5-325 MG tablet Take 2 tablets by mouth every 4 (four) hours as needed for severe pain.    Allergies  Allergen Reactions  . No Known Allergies     SOCIAL HISTORY/FAMILY HISTORY   Reviewed in Epic:  Pertinent findings:  Social History   Tobacco Use  . Smoking status: Former Smoker    Packs/day: 0.25    Types: Cigarettes    Quit date: 07/06/2017    Years since quitting: 3.3  . Smokeless tobacco: Never Used  Vaping Use  . Vaping Use: Never used  Substance Use Topics  . Alcohol use: No    Alcohol/week: 7.0 standard drinks    Types: 7 Cans of beer per week  . Drug use: Yes    Frequency: 1.0 times per week    Types: Marijuana, Heroin    Comment: snorts heroin, takes pain pills that he buys off the street   Social History   Social History Narrative  . Not on file    OBJCTIVE -PE, EKG, labs   Wt Readings from Last 3 Encounters:  10/25/20 154 lb 12.8 oz (70.2 kg)  05/18/19 160 lb (72.6 kg)  04/15/18 160 lb (72.6 kg)    Physical Exam: BP (!) 152/84   Pulse 63   Ht 6\' 1"  (1.854 m)   Wt 154 lb 12.8 oz (70.2 kg)   BMI 20.42 kg/m  Physical Exam Vitals reviewed.  Constitutional:      General: He is not in acute distress.    Appearance: He is normal weight. He is ill-appearing (Chronically ill-appearing). He is not toxic-appearing.     Comments: Somewhat thin, almost lethargic appearing.  Very soft-spoken  Neck:     Vascular: No carotid bruit (Fistula bruit heard), hepatojugular reflux or JVD.  Cardiovascular:     Rate and Rhythm: Normal rate and regular rhythm. Occasional extrasystoles are present.    Chest Wall: PMI is not displaced  (Somewhat sustained).     Pulses: Decreased pulses (Weak pulses bilaterally upper and lower extremities.).     Heart sounds: S1 normal and S2 normal. Murmur (.  1/6 SEM RUSB) heard.  No friction rub. Gallop present. S4 sounds present.   Pulmonary:     Effort: Pulmonary effort is normal. No respiratory distress.     Breath sounds: Normal breath sounds.  Abdominal:     General:  Abdomen is flat. Bowel sounds are normal. There is no distension.     Palpations: Abdomen is soft.  Musculoskeletal:        General: No swelling. Normal range of motion.     Cervical back: Normal range of motion and neck supple.     Comments: Left upper arm thrill from fistula  Neurological:     General: No focal deficit present.     Mental Status: He is alert and oriented to person, place, and time.     Cranial Nerves: No cranial nerve deficit.  Psychiatric:     Comments: Somewhat flat, blunted affect.  Seems unwilling to talk.  Not forthcoming with information.  Poor judgment and insight.      Adult ECG Report  Rate: 63 ;  Rhythm: normal sinus rhythm and LVH with repolarization normalities.  Cannot rule out anterior MI, age indeterminate; normal axis, intervals and durations.  Narrative Interpretation: Borderline EKG.  Recent Labs: No recent labs provided. Lab Results  Component Value Date   CHOL 242 (H) 07/29/2015   HDL 51 07/29/2015   LDLCALC 157 (H) 07/29/2015   TRIG 81 09/12/2016   CHOLHDL 4.7 07/29/2015   Lab Results  Component Value Date   CREATININE 8.67 (H) 05/18/2019   BUN 48 (H) 05/18/2019   NA 133 (L) 05/18/2019   K 3.4 (L) 05/18/2019   CL 94 (L) 05/18/2019   CO2 23 05/18/2019   CBC Latest Ref Rng & Units 05/18/2019 01/17/2018 01/16/2018  WBC 4.0 - 10.5 K/uL 8.1 12.6(H) 13.4(H)  Hemoglobin 13.0 - 17.0 g/dL 11.7(L) 9.5(L) 10.6(L)  Hematocrit 39.0 - 52.0 % 35.0(L) 28.2(L) 32.9(L)  Platelets 150 - 400 K/uL 275 337 337    No results found for:  TSH  ================================================   COVID-19 Education: The signs and symptoms of COVID-19 were discussed with the patient and how to seek care for testing (follow up with PCP or arrange E-visit).   The importance of social distancing and COVID-19 vaccination was discussed today. The patient is practicing social distancing & Masking.   I spent a total of 23 minutes with the patient spent in direct patient consultation.  Additional time spent with chart review  / charting (studies, outside notes, etc): 30 min Total Time: 53 min   Current medicines are reviewed at length with the patient today.  (+/- concerns) n/a  This visit occurred during the SARS-CoV-2 public health emergency.  Safety protocols were in place, including screening questions prior to the visit, additional usage of staff PPE, and extensive cleaning of exam room while observing appropriate contact time as indicated for disinfecting solutions.  Notice: This dictation was prepared with Dragon dictation along with smaller phrase technology. Any transcriptional errors that result from this process are unintentional and may not be corrected upon review.  Patient Instructions / Medication Changes & Studies & Tests Ordered   Patient Instructions  Medication Instructions:  No changes *If you need a refill on your cardiac medications before your next appointment, please call your pharmacy*   Lab Work: Lipid , liver function when you have echo done - fasting   If you have labs (blood work) drawn today and your tests are completely normal, you will receive your results only by: Marland Kitchen MyChart Message (if you have MyChart) OR . A paper copy in the mail If you have any lab test that is abnormal or we need to change your treatment, we will call you to review the results.   Testing/Procedures: [address] [place] has requested that you have an echocardiogram. Echocardiography is a  painless test that uses sound waves to create images of your heart. It provides your doctor with information about the size and shape of your heart and how well your heart's chambers and valves are working. This procedure takes approximately one hour. There are no restrictions for this procedure.     Follow-Up: At [facility], you and your health needs are our priority.  As part of our continuing mission to provide you with exceptional heart care, we have created designated Provider Care Teams.  These Care Teams include your primary Cardiologist (physician) and Advanced Practice Providers (APPs -  Physician Assistants and Nurse Practitioners) who all work together to provide you with the care you need, when you need it.  We recommend signing up for the patient portal called "MyChart".  Sign up information is provided on this After Visit Summary.  MyChart is used to connect with patients for Virtual Visits (Telemedicine).  Patients are able to view lab/test results, encounter notes, upcoming appointments, etc.  Non-urgent messages can be sent to your provider as well.   To learn more about what you can do with MyChart, go to [email or web address].    Your next appointment:   1 month(s)  The format for your next appointment:   In Person  Provider:   Glenetta Hew, MD   Other Instructions     Studies Ordered:   Orders Placed This Encounter  Procedures  . Hepatic function panel  . Lipid panel  . Hepatic function panel  . Lipid panel  . EKG 12-Lead  . ECHOCARDIOGRAM COMPLETE     Glenetta Hew, M.D., M.S. Interventional Cardiologist   Pager # [phone] Phone # [phone] [address] [place],  [address]   Thank you for choosing Heart[facility] at [facility]!!

## 2020-10-25 NOTE — Patient Instructions (Signed)
Medication Instructions:  No changes *If you need a refill on your cardiac medications before your next appointment, please call your pharmacy*   Lab Work: Lipid , liver function when you have echo done - fasting   If you have labs (blood work) drawn today and your tests are completely normal, you will receive your results only by: Marland Kitchen MyChart Message (if you have MyChart) OR . A paper copy in the mail If you have any lab test that is abnormal or we need to change your treatment, we will call you to review the results.   Testing/Procedures: [place] has requested that you have an echocardiogram. Echocardiography is a painless test that uses sound waves to create images of your heart. It provides your doctor with information about the size and shape of your heart and how well your heart's chambers and valves are working. This procedure takes approximately one hour. There are no restrictions for this procedure.     Follow-Up: At [facility], you and your health needs are our priority.  As part of our continuing mission to provide you with exceptional heart care, we have created designated Provider Care Teams.  These Care Teams include your primary Cardiologist (physician) and Advanced Practice Providers (APPs -  Physician Assistants and Nurse Practitioners) who all work together to provide you with the care you need, when you need it.  We recommend signing up for the patient portal called "MyChart".  Sign up information is provided on this After Visit Summary.  MyChart is used to connect with patients for Virtual Visits (Telemedicine).  Patients are able to view lab/test results, encounter notes, upcoming appointments, etc.  Non-urgent messages can be sent to your provider as well.   To learn more about what you can do with MyChart, go to [email or web address].    Your next appointment:   1 month(s)  The format for your next appointment:   In  Person  Provider:   Glenetta Hew, MD   Other Instructions

## 2020-10-30 ENCOUNTER — Encounter: Payer: Self-pay | Admitting: Cardiology

## 2020-10-30 NOTE — Assessment & Plan Note (Addendum)
Exertional dyspnea.  Previous cardiac 2D echo and based on that decide whether we would proceed with cardiac catheterization grossly abnormal versus coronary CT angiogram if no wall motion abnormality or reduced EF.  Also cannot exclude underlying COPD.  With potential for CAD, we will check lipid panel and chemistry panel.

## 2020-10-30 NOTE — Assessment & Plan Note (Signed)
Poorly controlled, but with hypotension during dialysis some leery of being too aggressive.  He is being followed by a nephrologist, and ostensibly is having his blood pressure control as well.  Suspect we could potentially convert diltiazem to carvedilol if there is still no concern with cocaine use.

## 2020-10-30 NOTE — Assessment & Plan Note (Signed)
He says he has been abstinent, however cannot exclude potential cardiomyopathy from previous use.  Plan: Check 2D echo

## 2020-10-30 NOTE — Assessment & Plan Note (Signed)
Blood pressure still not adequately controlled, but he has hypotension during dialysis which precludes aggressive treatment.  He is on combination of amlodipine and diltiazem-IV diltiazem he is long ago because of concern of possible cocaine use.  Previous echocardiogram was relatively normal, but the extent of dyspnea he is having for now would like to go ahead and proceed with echocardiogram.  Plan: Echocardiogram

## 2020-11-12 ENCOUNTER — Other Ambulatory Visit (HOSPITAL_COMMUNITY): Payer: Medicare (Managed Care)

## 2020-11-26 ENCOUNTER — Telehealth (HOSPITAL_COMMUNITY): Payer: Self-pay | Admitting: Cardiology

## 2020-11-26 ENCOUNTER — Ambulatory Visit: Payer: Medicare (Managed Care) | Admitting: Cardiology

## 2020-11-26 NOTE — Telephone Encounter (Signed)
Just an FYI. We have made several attempts to contact this patient including sending a letter to schedule or reschedule their echocardiogram. We will be removing the patient from the echo [place].    11/12/20 NO SHOW MAILED LETTER [facility]     Thank you

## 2021-01-05 ENCOUNTER — Ambulatory Visit: Payer: Medicare (Managed Care) | Admitting: Podiatry

## 2021-04-12 DIAGNOSIS — R52 Pain, unspecified: Secondary | ICD-10-CM | POA: Insufficient documentation

## 2021-05-19 ENCOUNTER — Other Ambulatory Visit: Payer: Self-pay

## 2021-05-19 ENCOUNTER — Encounter (HOSPITAL_COMMUNITY): Payer: Self-pay | Admitting: Emergency Medicine

## 2021-05-19 ENCOUNTER — Inpatient Hospital Stay (HOSPITAL_COMMUNITY)
Admission: EM | Admit: 2021-05-19 | Discharge: 2021-05-27 | DRG: 335 | Disposition: A | Discharge status: Home / Self Care | Payer: Medicare HMO | Attending: Student in an Organized Health Care Education/Training Program | Admitting: Student in an Organized Health Care Education/Training Program

## 2021-05-19 DIAGNOSIS — Z0189 Encounter for other specified special examinations: Secondary | ICD-10-CM

## 2021-05-19 DIAGNOSIS — R140 Abdominal distension (gaseous): Secondary | ICD-10-CM

## 2021-05-19 DIAGNOSIS — K567 Ileus, unspecified: Secondary | ICD-10-CM | POA: Diagnosis not present

## 2021-05-19 DIAGNOSIS — I272 Pulmonary hypertension, unspecified: Secondary | ICD-10-CM | POA: Diagnosis present

## 2021-05-19 DIAGNOSIS — I132 Hypertensive heart and chronic kidney disease with heart failure and with stage 5 chronic kidney disease, or end stage renal disease: Secondary | ICD-10-CM | POA: Diagnosis present

## 2021-05-19 DIAGNOSIS — Z20822 Contact with and (suspected) exposure to covid-19: Secondary | ICD-10-CM | POA: Diagnosis present

## 2021-05-19 DIAGNOSIS — R109 Unspecified abdominal pain: Secondary | ICD-10-CM | POA: Diagnosis not present

## 2021-05-19 DIAGNOSIS — N400 Enlarged prostate without lower urinary tract symptoms: Secondary | ICD-10-CM | POA: Diagnosis present

## 2021-05-19 DIAGNOSIS — Z9049 Acquired absence of other specified parts of digestive tract: Secondary | ICD-10-CM

## 2021-05-19 DIAGNOSIS — K56609 Unspecified intestinal obstruction, unspecified as to partial versus complete obstruction: Secondary | ICD-10-CM | POA: Diagnosis present

## 2021-05-19 DIAGNOSIS — I5032 Chronic diastolic (congestive) heart failure: Secondary | ICD-10-CM | POA: Diagnosis present

## 2021-05-19 DIAGNOSIS — Z841 Family history of disorders of kidney and ureter: Secondary | ICD-10-CM

## 2021-05-19 DIAGNOSIS — Z85038 Personal history of other malignant neoplasm of large intestine: Secondary | ICD-10-CM

## 2021-05-19 DIAGNOSIS — Z8249 Family history of ischemic heart disease and other diseases of the circulatory system: Secondary | ICD-10-CM

## 2021-05-19 DIAGNOSIS — Z833 Family history of diabetes mellitus: Secondary | ICD-10-CM

## 2021-05-19 DIAGNOSIS — K565 Intestinal adhesions [bands], unspecified as to partial versus complete obstruction: Principal | ICD-10-CM | POA: Diagnosis present

## 2021-05-19 DIAGNOSIS — L98419 Non-pressure chronic ulcer of buttock with unspecified severity: Secondary | ICD-10-CM | POA: Diagnosis present

## 2021-05-19 DIAGNOSIS — Z79899 Other long term (current) drug therapy: Secondary | ICD-10-CM

## 2021-05-19 DIAGNOSIS — E861 Hypovolemia: Secondary | ICD-10-CM | POA: Diagnosis present

## 2021-05-19 DIAGNOSIS — K469 Unspecified abdominal hernia without obstruction or gangrene: Secondary | ICD-10-CM | POA: Diagnosis present

## 2021-05-19 DIAGNOSIS — Z9119 Patient's noncompliance with other medical treatment and regimen: Secondary | ICD-10-CM

## 2021-05-19 DIAGNOSIS — Z992 Dependence on renal dialysis: Secondary | ICD-10-CM

## 2021-05-19 DIAGNOSIS — E875 Hyperkalemia: Secondary | ICD-10-CM | POA: Diagnosis present

## 2021-05-19 DIAGNOSIS — N2581 Secondary hyperparathyroidism of renal origin: Secondary | ICD-10-CM | POA: Diagnosis present

## 2021-05-19 DIAGNOSIS — N186 End stage renal disease: Secondary | ICD-10-CM

## 2021-05-19 DIAGNOSIS — Z4659 Encounter for fitting and adjustment of other gastrointestinal appliance and device: Secondary | ICD-10-CM

## 2021-05-19 DIAGNOSIS — D631 Anemia in chronic kidney disease: Secondary | ICD-10-CM | POA: Diagnosis present

## 2021-05-19 DIAGNOSIS — Z87891 Personal history of nicotine dependence: Secondary | ICD-10-CM

## 2021-05-19 DIAGNOSIS — I10 Essential (primary) hypertension: Secondary | ICD-10-CM | POA: Diagnosis present

## 2021-05-19 LAB — CBC WITH DIFFERENTIAL/PLATELET
Abs Immature Granulocytes: 0.02 10*3/uL (ref 0.00–0.07)
Basophils Absolute: 0 10*3/uL (ref 0.0–0.1)
Basophils Relative: 0 %
Eosinophils Absolute: 0 10*3/uL (ref 0.0–0.5)
Eosinophils Relative: 0 %
HCT: 38.3 % — ABNORMAL LOW (ref 39.0–52.0)
Hemoglobin: 12.5 g/dL — ABNORMAL LOW (ref 13.0–17.0)
Immature Granulocytes: 0 %
Lymphocytes Relative: 19 %
Lymphs Abs: 0.9 10*3/uL (ref 0.7–4.0)
MCH: 29.9 pg (ref 26.0–34.0)
MCHC: 32.6 g/dL (ref 30.0–36.0)
MCV: 91.6 fL (ref 80.0–100.0)
Monocytes Absolute: 1.1 10*3/uL — ABNORMAL HIGH (ref 0.1–1.0)
Monocytes Relative: 22 %
Neutro Abs: 2.9 10*3/uL (ref 1.7–7.7)
Neutrophils Relative %: 59 %
Platelets: 279 10*3/uL (ref 150–400)
RBC: 4.18 MIL/uL — ABNORMAL LOW (ref 4.22–5.81)
RDW: 14.2 % (ref 11.5–15.5)
WBC: 4.9 10*3/uL (ref 4.0–10.5)
nRBC: 0 % (ref 0.0–0.2)

## 2021-05-19 LAB — COMPREHENSIVE METABOLIC PANEL
ALT: 10 U/L (ref 0–44)
AST: 22 U/L (ref 15–41)
Albumin: 4 g/dL (ref 3.5–5.0)
Alkaline Phosphatase: 76 U/L (ref 38–126)
Anion gap: 14 (ref 5–15)
BUN: 20 mg/dL (ref 6–20)
CO2: 33 mmol/L — ABNORMAL HIGH (ref 22–32)
Calcium: 9.1 mg/dL (ref 8.9–10.3)
Chloride: 87 mmol/L — ABNORMAL LOW (ref 98–111)
Creatinine, Ser: 6.65 mg/dL — ABNORMAL HIGH (ref 0.61–1.24)
GFR, Estimated: 9 mL/min — ABNORMAL LOW (ref >60)
Glucose, Bld: 89 mg/dL (ref 70–99)
Potassium: 4.3 mmol/L (ref 3.5–5.1)
Sodium: 134 mmol/L — ABNORMAL LOW (ref 135–145)
Total Bilirubin: 0.8 mg/dL (ref 0.3–1.2)
Total Protein: 8 g/dL (ref 6.5–8.1)

## 2021-05-19 LAB — LIPASE, BLOOD: Lipase: 40 U/L (ref 11–51)

## 2021-05-19 MED ORDER — ONDANSETRON 4 MG PO TBDP: 4.0000 mg | ORAL_TABLET | Freq: Once | ORAL | Status: DC

## 2021-05-19 MED ORDER — MORPHINE SULFATE (PF) 4 MG/ML IV SOLN
4.0000 mg | Freq: Once | INTRAVENOUS | Status: AC
Start: 2021-05-19 — End: 2021-05-19
  Administered 2021-05-19: 4 mg via INTRAVENOUS
  Filled 2021-05-19: qty 1

## 2021-05-19 MED ORDER — ONDANSETRON HCL 4 MG/2ML IJ SOLN
4.0000 mg | Freq: Once | INTRAMUSCULAR | Status: AC
  Administered 2021-05-19: 4 mg via INTRAVENOUS
  Filled 2021-05-19: qty 2

## 2021-05-19 MED ORDER — OXYCODONE-ACETAMINOPHEN 5-325 MG PO TABS: 1.0000 | ORAL_TABLET | Freq: Once | ORAL | Status: DC

## 2021-05-19 NOTE — ED Triage Notes (Signed)
HD pt arrive POV with abd pain for the last 2 days, last HD treatment today. States he vomited yesterday, but no today.

## 2021-05-19 NOTE — ED Provider Notes (Signed)
Emergency Medicine Provider Triage Evaluation Note  Timothy [name] , a 60 y.o. male  was evaluated in triage.  Pt complains of severe abdominal pain with vomiting x2 days.  He is end-stage renal disease and had full dialysis today.  He is not taking anything for symptoms.  He said he has had symptoms that are similar in the past "when I had bowel trouble." Review of Systems  Positive: Abdominal pain Negative: Fever  Physical Exam  BP (!) 183/116 (BP Location: Right Arm)   Pulse 94   Temp 98.1 F (36.7 C) (Oral)   Resp 20   Ht 6\' 1"  (1.854 m)   Wt 70.2 kg   SpO2 99%   BMI 20.42 kg/m  Gen:   Awake, no distress   Resp:  Normal effort  MSK:   Moves extremities without difficulty  Other:  Distended abdomen, tender over surgical scar  Medical Decision Making  Medically screening exam initiated at 8:23 PM.  Appropriate orders placed.  Topher Buenaventura was informed that the remainder of the evaluation will be completed by another provider, this initial triage assessment does not replace that evaluation, and the importance of remaining in the ED until their evaluation is complete.  Patient here with abdominal pain and vomiting.  Labs and imaging ordered.   Margarita Mail, PA-C 05/19/21 2027    Deno Etienne, DO 05/19/21 2205

## 2021-05-20 ENCOUNTER — Observation Stay (HOSPITAL_COMMUNITY): Payer: Medicare HMO

## 2021-05-20 ENCOUNTER — Emergency Department (HOSPITAL_COMMUNITY): Payer: Medicare HMO

## 2021-05-20 DIAGNOSIS — K56609 Unspecified intestinal obstruction, unspecified as to partial versus complete obstruction: Secondary | ICD-10-CM | POA: Diagnosis not present

## 2021-05-20 LAB — HIV ANTIBODY (ROUTINE TESTING W REFLEX): HIV Screen 4th Generation wRfx: NONREACTIVE

## 2021-05-20 LAB — RESP PANEL BY RT-PCR (FLU A&B, COVID) ARPGX2
Influenza A by PCR: NEGATIVE
Influenza B by PCR: NEGATIVE
SARS Coronavirus 2 by RT PCR: NEGATIVE

## 2021-05-20 MED ORDER — FENTANYL CITRATE (PF) 100 MCG/2ML IJ SOLN
50.0000 ug | Freq: Once | INTRAMUSCULAR | Status: AC
  Administered 2021-05-20: 50 ug via INTRAVENOUS
  Filled 2021-05-20: qty 2

## 2021-05-20 MED ORDER — HEPARIN SODIUM (PORCINE) 5000 UNIT/ML IJ SOLN
5000.0000 [IU] | Freq: Three times a day (TID) | INTRAMUSCULAR | Status: DC
  Administered 2021-05-20 – 2021-05-23 (×9): 5000 [IU] via SUBCUTANEOUS
  Filled 2021-05-20 (×8): qty 1

## 2021-05-20 MED ORDER — DIATRIZOATE MEGLUMINE & SODIUM 66-10 % PO SOLN
90.0000 mL | Freq: Once | ORAL | Status: AC
  Administered 2021-05-20: 90 mL via NASOGASTRIC
  Filled 2021-05-20: qty 90

## 2021-05-20 MED ORDER — LABETALOL HCL 5 MG/ML IV SOLN
10.0000 mg | INTRAVENOUS | Status: DC | PRN
  Administered 2021-05-20 – 2021-05-25 (×15): 10 mg via INTRAVENOUS
  Filled 2021-05-20 (×17): qty 4

## 2021-05-20 MED ORDER — LACTATED RINGERS IV SOLN: INTRAVENOUS | Status: AC

## 2021-05-20 MED ORDER — FENTANYL CITRATE (PF) 100 MCG/2ML IJ SOLN
25.0000 ug | INTRAMUSCULAR | Status: DC | PRN
  Administered 2021-05-20: 25 ug via INTRAVENOUS
  Filled 2021-05-20: qty 2

## 2021-05-20 MED ORDER — HYDROMORPHONE HCL 1 MG/ML IJ SOLN
1.0000 mg | INTRAMUSCULAR | Status: DC | PRN
  Administered 2021-05-20 – 2021-05-23 (×14): 1 mg via INTRAVENOUS
  Filled 2021-05-20 (×14): qty 1

## 2021-05-20 MED ORDER — IOHEXOL 300 MG/ML  SOLN
75.0000 mL | Freq: Once | INTRAMUSCULAR | Status: AC | PRN
  Administered 2021-05-20: 75 mL via INTRAVENOUS

## 2021-05-20 NOTE — Progress Notes (Signed)
Abdominal xray shows NG above GE junction - discussed with RN and she will advance

## 2021-05-20 NOTE — ED Provider Notes (Signed)
[facility] EMERGENCY DEPARTMENT Provider Note   CSN: [id number] Arrival date & time: 05/19/21  1950     History Chief Complaint  Patient presents with   Abdominal Pain    Timothy [name] is a 60 y.o. male.  The history is provided by the patient and medical records.  Abdominal Pain Timothy [name] is a 60 y.o. male who presents to the Emergency Department complaining of abdominal pain.  He presents to the ED for evaluation of two days of throbbing abdominal pain.  Pain is constant. Has associated constipation.  Vomited earlier today.  No fever.  Anuric.  Has ESRD, dialyzes T, TH, Sat.  Dialyzed yesterday - received full session.  Has experienced similar episode in the past secondary to constipation.  Has not tried anything at home.      Past Medical History:  Diagnosis Date   Anemia of chronic kidney failure    BPH (benign prostatic hyperplasia)    Colon cancer ([place]) 2014   End stage renal disease on dialysis ([place]) 2017   Hypertension    Hypertensive heart disease with chronic diastolic congestive heart failure ([place]) 07/17/2016   Polysubstance abuse ([place])    History of heroin and marijuana use    Patient Active Problem List   Diagnosis Date Noted   DOE (dyspnea on exertion) 10/25/2020   Orthopnea 10/25/2020   Open wound of left hand 01/18/2018   Tibial fracture 01/14/2018   Tibial plateau fracture, left 01/13/2018   Malnutrition of moderate degree 09/20/2016   AKI (acute kidney injury) ([place])    History of colon cancer    Hyponatremia    Leukocytosis    Acute blood loss anemia    Acute metabolic encephalopathy 69/48/5462   ESRD on dialysis ([place]) 09/09/2016   Hypertensive emergency 09/09/2016   Acute respiratory failure with hypoxia ([place]) 09/09/2016   Acute pulmonary edema ([place]) 09/09/2016   Polysubstance abuse ([place]) 09/09/2016   Nonadherence to medical treatment 09/09/2016   Anemia due to chronic kidney disease 09/09/2016   Altered mental status  09/09/2016   Acute respiratory failure ([place])    Drug ingestion    Hypertensive heart and chronic kidney disease with heart failure and with stage 5 chronic kidney disease, or end stage renal disease ([place]) 07/19/2016   Chest pain    Elevated troponin 70/35/0093   Acute diastolic heart failure, NYHA class 2 ([place]) 07/17/2016   Heroin abuse ([place]) 07/17/2016   HTN (hypertension) 07/17/2016   Benign prostate hyperplasia 07/16/2015    Past Surgical History:  Procedure Laterality Date   ABDOMINAL SURGERY     AV FISTULA PLACEMENT Left 09/19/2016   Procedure: Left arm Radiocephalic ARTERIOVENOUS (AV) FISTULA CREATION;  Surgeon: Conrad Bonham, MD;  Location: [facility];  Service: Vascular;  Laterality: Left;   [place] Left 07/09/2017   Procedure: BRACHIOCEPHALIC FISTULA CREATION;  Surgeon: Conrad [place], MD;  Location: [facility];  Service: Vascular;  Laterality: Left;   COLON SURGERY  2014   INSERTION OF DIALYSIS CATHETER N/A 09/19/2016   Procedure: INSERTION OF TUNNELED DIALYSIS CATHETER;  Surgeon: Conrad [place], MD;  Location: [facility];  Service: Vascular;  Laterality: N/A;   IR GENERIC HISTORICAL  09/14/2016   IR US GUIDE VASC ACCESS RIGHT 09/14/2016 [name], DO [facility]   IR GENERIC HISTORICAL  09/14/2016   IR FLUORO GUIDE CV LINE RIGHT 09/14/2016 [name], DO [facility]   ORIF TIBIA PLATEAU Left 01/15/2018   Procedure: OPEN REDUCTION INTERNAL  FIXATION (ORIF) TIBIAL PLATEAU;  Surgeon: Altamese [place], MD;  Location: [facility];  Service: Orthopedics;  Laterality: Left;   TRANSTHORACIC ECHOCARDIOGRAM  07/2016    EF 60-65%, No RWMA. Mod Concentric LVH - Gr 2 DD. Severe LA dilation. PAP ~35 mmHg (mild Pulm HTN)  --> no changes noted 1 month later       Family History  Problem Relation Age of Onset   Heart failure Mother        Died at age 24.   Heart attack Mother 1   Hypertension Mother    Diabetes Mellitus II Mother    Other Father        Unknown   Kidney failure  Sister        (Oldest Sister)   Other Other        Multiple siblings have started her heart disease, he is not sure of the details.   CAD Nephew     Social History   Tobacco Use   Smoking status: Former    Packs/day: 0.25    Types: Cigarettes    Quit date: 07/06/2017    Years since quitting: 3.8   Smokeless tobacco: Never  Vaping Use   Vaping Use: Never used  Substance Use Topics   Alcohol use: No    Alcohol/week: 7.0 standard drinks    Types: 7 Cans of beer per week   Drug use: Yes    Frequency: 1.0 times per week    Types: Marijuana, Heroin    Comment: snorts heroin, takes pain pills that he buys off the street    Home Medications Prior to Admission medications   Medication Sig Start Date End Date Taking? Authorizing Provider  albuterol (PROVENTIL HFA;VENTOLIN HFA) 108 (90 Base) MCG/ACT inhaler Inhale 2 puffs into the lungs 3 (three) times daily as needed for shortness of breath. 01/08/18   [provider]  amLODipine (NORVASC) 10 MG tablet Take 1 tablet (10 mg total) by mouth daily. 07/22/16   Elgergawy, Silver Huguenin, MD  diltiazem (CARDIZEM CD) 360 MG 24 hr capsule Take 1 capsule by mouth daily. 06/19/17   [provider]  docusate sodium (COLACE) 250 MG capsule Take 1 capsule (250 mg total) by mouth daily. 05/19/19   McDonald, Mia A, PA-C  lactulose (CEPHULAC) 10 g packet Take 1 packet (10 g total) by mouth 3 (three) times daily. 05/19/19   McDonald, Mia A, PA-C  oxyCODONE-acetaminophen (PERCOCET/ROXICET) 5-325 MG tablet Take 2 tablets by mouth every 4 (four) hours as needed for severe pain. 04/15/18   Hedges, Dellis Filbert, PA-C    Allergies    No known allergies  Review of Systems   Review of Systems  Gastrointestinal:  Positive for abdominal pain.  All other systems reviewed and are negative.  Physical Exam Updated Vital Signs BP (!) 183/102 (BP Location: Right Arm)   Pulse 78   Temp 98.4 F (36.9 C) (Oral)   Resp (!) 21   Ht 6\' 1"  (1.854 m)   Wt 70.2 kg    SpO2 97%   BMI 20.42 kg/m   Physical Exam Vitals and nursing note reviewed.  Constitutional:      Appearance: He is well-developed.  HENT:     Head: Normocephalic and atraumatic.  Cardiovascular:     Rate and Rhythm: Normal rate and regular rhythm.     Heart sounds: No murmur heard. Pulmonary:     Effort: Pulmonary effort is normal. No respiratory distress.     Breath sounds: Normal  breath sounds.  Abdominal:     Palpations: Abdomen is soft.     Tenderness: There is no guarding or rebound.     Comments: Moderate generalized abdominal tenderness.    Musculoskeletal:        General: No swelling or tenderness.  Skin:    General: Skin is warm and dry.  Neurological:     Mental Status: He is alert and oriented to person, place, and time.  Psychiatric:        Behavior: Behavior normal.    ED Results / Procedures / Treatments   Labs (all labs ordered are listed, but only abnormal results are displayed) Labs Reviewed  CBC WITH DIFFERENTIAL/PLATELET - Abnormal; Notable for the following components:      Result Value   RBC 4.18 (*)    Hemoglobin 12.5 (*)    HCT 38.3 (*)    Monocytes Absolute 1.1 (*)    All other components within normal limits  COMPREHENSIVE METABOLIC PANEL - Abnormal; Notable for the following components:   Sodium 134 (*)    Chloride 87 (*)    CO2 33 (*)    Creatinine, Ser 6.65 (*)    GFR, Estimated 9 (*)    All other components within normal limits  LIPASE, BLOOD  URINALYSIS, ROUTINE W REFLEX MICROSCOPIC    EKG None  Radiology No results found.  Procedures Procedures   Medications Ordered in ED Medications  morphine 4 MG/ML injection 4 mg (4 mg Intravenous Given 05/19/21 2048)  ondansetron (ZOFRAN) injection 4 mg (4 mg Intravenous Given 05/19/21 2048)    ED Course  I have reviewed the triage vital signs and the nursing notes.  Pertinent labs & imaging results that were available during my care of the patient were reviewed by me and considered  in my medical decision making (see chart for details).    MDM Rules/Calculators/A&P                          patient here for evaluation of abdominal pain, constipation and vomiting. He does have tenderness on examination. Imaging is concerning for small bowel obstruction. General surgery consulted in medicine consulted for admission. Patient updated findings of studies recommendation for mission and he is in agreement with treatment plan.  Final Clinical Impression(s) / ED Diagnoses Final diagnoses:  None    Rx / DC Orders ED Discharge Orders     None        Quintella Reichert, MD 05/20/21 [phone]

## 2021-05-20 NOTE — H&P (Addendum)
Date: 05/20/2021               Patient Name:  Timothy [name] MRN: [id number]  DOB: [birth year] Age / Sex: 60 y.o., male   PCP: [name], MD         Medical Service: Internal Medicine Teaching Service         Attending Physician: Dr. Evette Doffing, Mallie Mussel, *    First Contact: Dr. Posey Pronto Pager: [phone]  Second Contact: Dr. Marva Panda Pager: [phone]       After Hours (After 5p/  First Contact Pager: [phone]  weekends / holidays): Second Contact Pager: [phone]   Chief Complaint: Abdominal Pain, constipation   History of Present Illness:   Mr. Timothy [name] is a 60 year old male with a past medical history ESRD on HD, colon cancer s/p resection, hypertension, HFpEF, polysubstance abuse who presents to the ED with complaints of abdominal pain and constipation.  Mr. Timothy [name] states that he was feeling at his baseline up until 2 days ago when he felt the need to have a bowel movement but was unable to.  He states since then, he continues to be unable to pass gas or stool.  He began developing abdominal pain and after completing dialysis yesterday, decided he needed to go to the ED.  He notes that in the past several months he has not noticed any changes in his stool size or caliber, nor has he noticed any melena or hematochezia.  He has not lost any weight unintentionally.  He denies any fever, chills, chest pain, shortness of breath, leg swelling.  He denies any recent illnesses.  He notes a history of colon cancer in 2014 for which she had resection.  He denies ever requiring radiation or chemotherapy.  ED course: On arrival to the ED, patient was noted to be hypertensive at 183/116.  He had a heart rate of 94.  He was saturating at 99% on room air with a respiratory rate of 20.  He was afebrile at 98.1.  Initial blood work without evidence of leukocytosis or acute anemia.  CMP generally unremarkable other than elevated creatinine consistent with ESRD.  CT abdomen/pelvis was  obtained that demonstrated small bowel obstruction without a transition point.  General surgery was consulted for evaluation.  IMTS was consulted for admission.  Meds:  Current Meds  Medication Sig   albuterol (PROVENTIL HFA;VENTOLIN HFA) 108 (90 Base) MCG/ACT inhaler Inhale 2 puffs into the lungs 3 (three) times daily as needed for shortness of breath.   diltiazem (CARDIZEM CD) 360 MG 24 hr capsule Take 360 mg by mouth daily.   lidocaine-prilocaine (EMLA) cream Apply 1 application topically See admin instructions. Tuesday,thursday,Saturday prior to dialysis   oxyCODONE-acetaminophen (PERCOCET/ROXICET) 5-325 MG tablet Take 2 tablets by mouth every 4 (four) hours as needed for severe pain.   sevelamer carbonate (RENVELA) 800 MG tablet Take 2 tablets by mouth with breakfast, with lunch, and with evening meal.   Allergies: Allergies as of 05/19/2021 - Review Complete 05/19/2021  Allergen Reaction Noted   No known allergies  09/18/2016   Past Medical History:  Diagnosis Date   Anemia of chronic kidney failure    BPH (benign prostatic hyperplasia)    Colon cancer ([place]) 2014   End stage renal disease on dialysis ([place]) 2017   Hypertension    Hypertensive heart disease with chronic diastolic congestive heart failure ([place]) 07/17/2016   Polysubstance abuse ([place])    History of heroin and marijuana use  Family History:  Family History  Problem Relation Age of Onset   Heart failure Mother        Died at age 69.   Heart attack Mother 29   Hypertension Mother    Diabetes Mellitus II Mother    Other Father        Unknown   Kidney failure Sister        (Oldest Sister)   Other Other        Multiple siblings have started her heart disease, he is not sure of the details.   CAD Nephew    Social History:  Lives in [place] with his wife.  Independent in his ADLs  Review of Systems: A complete ROS was negative except as per HPI.   Physical Exam: Blood pressure (!) 192/104, pulse (!)  116, temperature 98.4 F (36.9 C), temperature source Oral, resp. rate (!) 22, height 6\' 1"  (1.854 m), weight 70.2 kg, SpO2 100 %.  Physical Exam Vitals and nursing note reviewed.  Constitutional:      General: He is not in acute distress.    Appearance: He is normal weight.  HENT:     Head: Normocephalic and atraumatic.  Cardiovascular:     Rate and Rhythm: Normal rate and regular rhythm.     Heart sounds: Murmur (systolic) heard.  Pulmonary:     Effort: Pulmonary effort is normal. No respiratory distress.     Breath sounds: Normal breath sounds. No wheezing, rhonchi or rales.  Abdominal:     General: There is no distension.     Palpations: Abdomen is soft.     Tenderness: There is abdominal tenderness in the right lower quadrant and left lower quadrant. There is no guarding.     Comments: Right lower quadrant abdomen with hyperactive bowel sounds.  Left lower quadrant with hypoactive bowel sounds that are high-pitched.  Skin:    General: Skin is warm and dry.     Findings: No erythema or rash.  Neurological:     General: No focal deficit present.     Mental Status: He is alert and oriented to person, place, and time.  Psychiatric:        Attention and Perception: Attention and perception normal.        Mood and Affect: Mood and affect normal.   Assessment & Plan by Problem: Active Problems:   Small bowel obstruction [facility])  Mr. Timothy [name] is a 60 year old male with a past medical history of colon cancer status post resection and ESRD on HD, hypertension presenting with abdominal pain and constipation currently admitted for small bowel obstruction.  # Small Bowel Obstruction  Given history of previous resection, likely etiology of obstruction is adhesions.  At this time, there is no evidence of perforation; patient is hemodynamically stable.  Due to episodes of emesis in the ED, NG tube has been ordered and placed.  - General surgery consulted; appreciate their recommendations -  NG tube placed - Small bowel protocol KUB per general surgery - Place NG tube to intermittent suction once imaging complete - Will hold off on maintenance fluids at this time  # ESRD  - Nephrology consulted for dialysis  # Hypertension  Per patient, he is not taking any blood pressure medications at home, however on medication list he has amlodipine and diltiazem listed.  He is quite hypertensive currently and may require antihypertensives, will use cautiously given risk of hypotension with SBO.  # Right kidney Lesion On imaging, there is evidence  of a new interpolar lesion concerning for renal cell carcinoma.  Radiology recommending MRI with and without contrast for further evaluation, however patient cannot receive contrast due to end-stage renal disease.  Discussed with nephrology who will follow this outpatient.  - Outpatient evaluation  Diet: NPO VTE: Heparin IVF: None,None Code: Full  Prior to Admission Living Arrangement: Home, living with his wife Anticipated Discharge Location: Home Barriers to Discharge: Continued medical evaluation  Dispo: Admit patient to Observation with expected length of stay less than 2 midnights.  Signed: Dr. Jose Persia Internal Medicine PGY-3 Pager: [phone] After 5pm on weekdays and 1pm on weekends: On Call pager [phone]  05/20/2021, 2:50 PM

## 2021-05-20 NOTE — Consult Note (Signed)
Consult Note  Eliberto Sole 02-15-1961  [id number].    Requesting MD: Dr. Ralene Bathe Chief Complaint/Reason for Consult: abdominal pain - small bowel obstruction  HPI:  60 year old male with medical history significant for end-stage renal disease on dialysis (T/TH/Sat), hypertension, colon cancer, BPH.  He has been compliant with his dialysis sessions.  He presented to [facility], ED secondary to abdominal pain.  Pain began 2 to 3 days ago, is mostly in the left lower quadrant, and has been constant since.  It is a throbbing severe pain.  He has had associated constipation, nausea, and emesis.  He denies fever. ROS otherwise as below  Work-up in the ED significant for normal WBC of 4.9, normal lipase.  CT scan showing "Findings are concerning for small bowel obstruction. An exact transition point is not confidently identified on today's examination."  I have personally reviewed the CT scan.  General surgery was asked to see  He confirms history of colon cancer and states he had surgery to remove cancerous portions of his colon in [place] in 2014.  He denies other abdominal surgeries. He is not on anticoagulation  ROS: Review of Systems  Constitutional:  Negative for chills and fever.  Respiratory:  Negative for cough and shortness of breath.   Cardiovascular:  Negative for chest pain, palpitations and leg swelling.  Gastrointestinal:  Positive for abdominal pain, constipation, nausea and vomiting. Negative for diarrhea.  Genitourinary:        Anuric   Family History  Problem Relation Age of Onset   Heart failure Mother        Died at age 70.   Heart attack Mother 65   Hypertension Mother    Diabetes Mellitus II Mother    Other Father        Unknown   Kidney failure Sister        (Oldest Sister)   Other Other        Multiple siblings have started her heart disease, he is not sure of the details.   CAD Nephew     Past Medical History:  Diagnosis Date   Anemia of  chronic kidney failure    BPH (benign prostatic hyperplasia)    Colon cancer ([place]) 2014   End stage renal disease on dialysis ([place]) 2017   Hypertension    Hypertensive heart disease with chronic diastolic congestive heart failure ([place]) 07/17/2016   Polysubstance abuse ([place])    History of heroin and marijuana use    Past Surgical History:  Procedure Laterality Date   ABDOMINAL SURGERY     AV FISTULA PLACEMENT Left 09/19/2016   Procedure: Left arm Radiocephalic ARTERIOVENOUS (AV) FISTULA CREATION;  Surgeon: Conrad [place], MD;  Location: [facility];  Service: Vascular;  Laterality: Left;   BASCILIC VEIN TRANSPOSITION Left 07/09/2017   Procedure: BRACHIOCEPHALIC FISTULA CREATION;  Surgeon: Conrad [place], MD;  Location: [facility];  Service: Vascular;  Laterality: Left;   COLON SURGERY  2014   INSERTION OF DIALYSIS CATHETER N/A 09/19/2016   Procedure: INSERTION OF TUNNELED DIALYSIS CATHETER;  Surgeon: Conrad [place], MD;  Location: [facility];  Service: Vascular;  Laterality: N/A;   IR GENERIC HISTORICAL  09/14/2016   IR US GUIDE VASC ACCESS RIGHT 09/14/2016 [name], DO [facility]   IR GENERIC HISTORICAL  09/14/2016   IR FLUORO GUIDE CV LINE RIGHT 09/14/2016 [name], DO [facility]   ORIF TIBIA PLATEAU Left 01/15/2018   Procedure: OPEN REDUCTION INTERNAL  FIXATION (ORIF) TIBIAL PLATEAU;  Surgeon: Altamese Opheim, MD;  Location: [facility];  Service: Orthopedics;  Laterality: Left;   TRANSTHORACIC ECHOCARDIOGRAM  07/2016    EF 60-65%, No RWMA. Mod Concentric LVH - Gr 2 DD. Severe LA dilation. PAP ~35 mmHg (mild Pulm HTN)  --> no changes noted 1 month later    Social History:  reports that he quit smoking about 3 years ago. His smoking use included cigarettes. He smoked an average of .25 packs per day. He has never used smokeless tobacco. He reports current drug use. Frequency: 1.00 time per week. Drugs: Marijuana and Heroin. He reports that he does not drink alcohol.  Allergies:  Allergies  Allergen  Reactions   No Known Allergies     (Not in a hospital admission)   Blood pressure (!) 155/87, pulse (!) 40, temperature 98.4 F (36.9 C), temperature source Oral, resp. rate (!) 22, height 6\' 1"  (1.854 m), weight 70.2 kg, SpO2 91 %. Physical Exam:  General: pleasant, chronically ill appearing male who is laying in bed in NAD HEENT: head is normocephalic, atraumatic.  Sclera are noninjected.  Scleral icterus present. Pupils equal and round.  Ears and nose without any masses or lesions.  Mouth is pink and moist Heart: regular, rate, and rhythm.  Normal s1,s2. No obvious murmurs, gallops, or rubs noted.  Palpable radial and pedal pulses bilaterally Lungs: CTAB, no wheezes, rhonchi, or rales noted.  Respiratory effort nonlabored on room Abd: soft, +BS, no masses, hernias, or organomegaly. Abdomen is not distended. TTP in LLQ - no rebound or guarding. Well healed surgical scar superior to umbilicus MS: all 4 extremities are symmetrical with no cyanosis, clubbing, or edema. Skin: warm and dry with no masses, lesions, or rashes Neuro: Cranial nerves 2-12 grossly intact, sensation is normal throughout Psych: A&Ox3 with an appropriate affect.   Results for orders placed or performed during the hospital encounter of 05/19/21 (from the past 48 hour(s))  CBC with Differential     Status: Abnormal   Collection Time: 05/19/21  8:49 PM  Result Value Ref Range   WBC 4.9 4.0 - 10.5 K/uL   RBC 4.18 (L) 4.22 - 5.81 MIL/uL   Hemoglobin 12.5 (L) 13.0 - 17.0 g/dL   HCT 38.3 (L) 39.0 - 52.0 %   MCV 91.6 80.0 - 100.0 fL   MCH 29.9 26.0 - 34.0 pg   MCHC 32.6 30.0 - 36.0 g/dL   RDW 14.2 11.5 - 15.5 %   Platelets 279 150 - 400 K/uL   nRBC 0.0 0.0 - 0.2 %   Neutrophils Relative % 59 %   Neutro Abs 2.9 1.7 - 7.7 K/uL   Lymphocytes Relative 19 %   Lymphs Abs 0.9 0.7 - 4.0 K/uL   Monocytes Relative 22 %   Monocytes Absolute 1.1 (H) 0.1 - 1.0 K/uL   Eosinophils Relative 0 %   Eosinophils Absolute 0.0 0.0 -  0.5 K/uL   Basophils Relative 0 %   Basophils Absolute 0.0 0.0 - 0.1 K/uL   Immature Granulocytes 0 %   Abs Immature Granulocytes 0.02 0.00 - 0.07 K/uL    Comment: Performed at [facility], [address] [place], [place] [address]  Comprehensive metabolic panel     Status: Abnormal   Collection Time: 05/19/21  8:49 PM  Result Value Ref Range   Sodium 134 (L) 135 - 145 mmol/L   Potassium 4.3 3.5 - 5.1 mmol/L   Chloride 87 (L) 98 - 111  mmol/L   CO2 33 (H) 22 - 32 mmol/L   Glucose, Bld 89 70 - 99 mg/dL    Comment: Glucose reference range applies only to samples taken after fasting for at least 8 hours.   BUN 20 6 - 20 mg/dL   Creatinine, Ser 6.65 (H) 0.61 - 1.24 mg/dL   Calcium 9.1 8.9 - 10.3 mg/dL   Total Protein 8.0 6.5 - 8.1 g/dL   Albumin 4.0 3.5 - 5.0 g/dL   AST 22 15 - 41 U/L   ALT 10 0 - 44 U/L   Alkaline Phosphatase 76 38 - 126 U/L   Total Bilirubin 0.8 0.3 - 1.2 mg/dL   GFR, Estimated 9 (L) >60 mL/min    Comment: (NOTE) Calculated using the CKD-EPI Creatinine Equation (2021)    Anion gap 14 5 - 15    Comment: Performed at [facility] [address] [place], [place] [address]  Lipase, blood     Status: None   Collection Time: 05/19/21  8:49 PM  Result Value Ref Range   Lipase 40 11 - 51 U/L    Comment: Performed at [facility], [place] [address] [place], [place] [address]   CT ABDOMEN PELVIS W CONTRAST  Result Date: 05/20/2021 CLINICAL DATA:  59 year old male with history of abdominal distension and periumbilical pain. EXAM: CT ABDOMEN AND PELVIS WITH CONTRAST TECHNIQUE: Multidetector CT imaging of the abdomen and pelvis was performed using the standard protocol following bolus administration of intravenous contrast. CONTRAST:  32mL OMNIPAQUE IOHEXOL 300 MG/ML  SOLN COMPARISON:  CT the abdomen and pelvis 05/19/2019. FINDINGS: Lower chest: Linear areas of scarring are noted in the lung bases bilaterally, most pronounced in the left lower lobe.  Cardiomegaly. Atherosclerotic calcifications in the left circumflex and right coronary arteries. Calcifications of the aortic valve. Hepatobiliary: No suspicious cystic or solid hepatic lesions. No intra or extrahepatic biliary ductal dilatation. 1.6 cm predominantly noncalcified gallstone lies dependently in the gallbladder. Gallbladder is only moderately distended without definite wall thickening. Pancreas: No definite pancreatic mass. Pancreatic duct is diffusely dilated measuring up to 8 mm in the pancreatic head and body. No definite peripancreatic fluid collections or inflammatory changes. Spleen: 1.9 x 1.2 cm low-attenuation lesion in the superior aspect of the spleen (axial image 20 of series 3), nonspecific, but favored to represent a cyst. Adrenals/Urinary Tract: Kidneys are atrophic bilaterally and contain innumerable small low-attenuation lesions, most of which are too small to characterize, but favored to represent cysts. Some other intermediate attenuation lesions are also noted, also generally too small to characterize. In addition, in the anterior aspect of the interpolar region of the right kidney (axial image 40 of series 3 and coronal image 93 of series 6) there is a 3.7 x 2.9 x 2.6 cm ill-defined mess which is heterogeneous in attenuation with extensive mural thickening (particularly anterolaterally), but new compared to prior examination 05/19/2019, concerning for primary renal neoplasm such as renal cell carcinoma. No hydroureteronephrosis. Urinary bladder is normal in appearance. Stomach/Bowel: The appearance of the stomach is normal. Status post right hemicolectomy. Remaining portions of the colon and rectum appear decompressed. There are multiple dilated loops of small bowel measuring up to 4.5 cm in diameter, with multiple air-fluid levels. An exact transition point is not confidently identified on today's examination. Vascular/Lymphatic: Aortic atherosclerosis, without evidence of aneurysm  or dissection in the abdominal or pelvic vasculature. No lymphadenopathy noted in the abdomen or pelvis. Reproductive: Prostate gland and seminal vesicles are  unremarkable in appearance. Other: Small volume of ascites. Diffuse mesenteric edema. No pneumoperitoneum. Musculoskeletal: There are no aggressive appearing lytic or blastic lesions noted in the visualized portions of the skeleton. IMPRESSION: 1. Findings are concerning for small bowel obstruction. An exact transition point is not confidently identified on today's examination. 2. Small volume of ascites. 3. 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 This is of uncertain etiology and significance. 4. Severe atrophy of the kidneys bilaterally with numerous small lesions, most of which are favored to represent a combination of simple and proteinaceous/hemorrhagic cysts. The exception is a new lesion in the anterior aspect of the interpolar region of the right kidney which measures 3.7 x 2.9 x 2.6 cm which is highly concerning for a renal cell carcinoma. Further evaluation with nonemergent abdominal MRI with and without IV gadolinium is recommended in the near future to better evaluate this finding. 5. New small cystic lesion in the superior aspect of the spleen, indeterminate, but favored to be benign. Attention at time of forthcoming MRI examination is recommended. 6. Cholelithiasis without definitive evidence to suggest acute cholecystitis. 7. Aortic atherosclerosis, in addition to least 2 vessel coronary artery disease. Please note that although the presence of coronary artery calcium documents the presence of coronary artery disease, the severity of this disease and any potential stenosis cannot be assessed on this non-gated CT examination. Assessment for potential risk factor modification, dietary therapy or  pharmacologic therapy may be warranted, if clinically indicated. 8. There are calcifications of the aortic valve. Echocardiographic correlation for evaluation of potential valvular dysfunction may be warranted if clinically indicated. 9. Additional incidental findings, as above. Electronically Signed   By: Vinnie Langton M.D.   On: 05/20/2021 06:08      Assessment/Plan 60 yo M with small bowel obstruction - in setting of history of colon cancer and colon resection - recommend small bowel obstruction protocol and given recent emesis placement of NG tube - we will follow along with you  Admit to hospitalist team  FEN: NPO, NGT, IVF per primary ID: none VTE: okay for chemical prophylaxis from our standpoint  ESRD - dialysis HTN  Winferd Humphrey, [facility] [place] [facility] 05/20/2021, 7:23 AM Please see Amion for pager number during day hours 7:00am-4:30pm

## 2021-05-20 NOTE — ED Notes (Signed)
Patient transported to CT 

## 2021-05-20 NOTE — ED Notes (Signed)
Attending at bedside talking to patient. Patient stated to MD and to this RN that he might pull the NGT out himself because it was uncomfortable. This RN did explain the risks of not having the NGT as well as the benefits of the tube r/t SBO. He stated that he would "rather have another surgery than this thing in".

## 2021-05-20 NOTE — ED Notes (Signed)
Wife at bedside.

## 2021-05-20 NOTE — ED Notes (Signed)
2nd attempt to call report to floor unit, Nurse not available at this time.

## 2021-05-20 NOTE — ED Notes (Signed)
Pt refused vitals 

## 2021-05-20 NOTE — ED Notes (Signed)
Attempt to call report to unit, nurse unavailable at this time, will call back

## 2021-05-20 NOTE — ED Notes (Addendum)
Pt stated he wanted NG tube removed, tried to encourage pt to keep NGT in. Pt still wants it removed. Attending MD, Zierle-Ghosh paged and stated to remove per request.

## 2021-05-20 NOTE — ED Notes (Signed)
Pt repeatedly asking to have ngt removed and leave.  Dr B. Notified, and spoke to patient.  Stated it would be AMA.  Pt states just wants the tube out.  Pt encouraged to try to keep tube in.

## 2021-05-20 NOTE — ED Notes (Signed)
NGT advanced 5cm per radiology recommendation.  Pt tolerated well

## 2021-05-20 NOTE — ED Notes (Signed)
Xray at bedside for NG placement verification.

## 2021-05-20 NOTE — ED Notes (Signed)
Pt reports he does not make urine, no sample will be obtained (per pt).

## 2021-05-21 ENCOUNTER — Inpatient Hospital Stay (HOSPITAL_COMMUNITY): Payer: Medicare HMO

## 2021-05-21 ENCOUNTER — Observation Stay (HOSPITAL_COMMUNITY): Payer: Medicare HMO

## 2021-05-21 DIAGNOSIS — K56609 Unspecified intestinal obstruction, unspecified as to partial versus complete obstruction: Secondary | ICD-10-CM | POA: Diagnosis not present

## 2021-05-21 DIAGNOSIS — I272 Pulmonary hypertension, unspecified: Secondary | ICD-10-CM | POA: Diagnosis present

## 2021-05-21 DIAGNOSIS — Z87891 Personal history of nicotine dependence: Secondary | ICD-10-CM | POA: Diagnosis not present

## 2021-05-21 DIAGNOSIS — D631 Anemia in chronic kidney disease: Secondary | ICD-10-CM | POA: Diagnosis present

## 2021-05-21 DIAGNOSIS — Z8249 Family history of ischemic heart disease and other diseases of the circulatory system: Secondary | ICD-10-CM | POA: Diagnosis not present

## 2021-05-21 DIAGNOSIS — L98419 Non-pressure chronic ulcer of buttock with unspecified severity: Secondary | ICD-10-CM | POA: Diagnosis present

## 2021-05-21 DIAGNOSIS — I5032 Chronic diastolic (congestive) heart failure: Secondary | ICD-10-CM | POA: Diagnosis present

## 2021-05-21 DIAGNOSIS — Z79899 Other long term (current) drug therapy: Secondary | ICD-10-CM | POA: Diagnosis not present

## 2021-05-21 DIAGNOSIS — K567 Ileus, unspecified: Secondary | ICD-10-CM | POA: Diagnosis not present

## 2021-05-21 DIAGNOSIS — Z85038 Personal history of other malignant neoplasm of large intestine: Secondary | ICD-10-CM | POA: Diagnosis not present

## 2021-05-21 DIAGNOSIS — Z992 Dependence on renal dialysis: Secondary | ICD-10-CM | POA: Diagnosis not present

## 2021-05-21 DIAGNOSIS — N186 End stage renal disease: Secondary | ICD-10-CM | POA: Diagnosis present

## 2021-05-21 DIAGNOSIS — E875 Hyperkalemia: Secondary | ICD-10-CM | POA: Diagnosis present

## 2021-05-21 DIAGNOSIS — K469 Unspecified abdominal hernia without obstruction or gangrene: Secondary | ICD-10-CM | POA: Diagnosis present

## 2021-05-21 DIAGNOSIS — E861 Hypovolemia: Secondary | ICD-10-CM | POA: Diagnosis present

## 2021-05-21 DIAGNOSIS — Z9049 Acquired absence of other specified parts of digestive tract: Secondary | ICD-10-CM | POA: Diagnosis not present

## 2021-05-21 DIAGNOSIS — N2581 Secondary hyperparathyroidism of renal origin: Secondary | ICD-10-CM | POA: Diagnosis present

## 2021-05-21 DIAGNOSIS — R109 Unspecified abdominal pain: Secondary | ICD-10-CM | POA: Diagnosis present

## 2021-05-21 DIAGNOSIS — Z9119 Patient's noncompliance with other medical treatment and regimen: Secondary | ICD-10-CM | POA: Diagnosis not present

## 2021-05-21 DIAGNOSIS — K565 Intestinal adhesions [bands], unspecified as to partial versus complete obstruction: Secondary | ICD-10-CM | POA: Diagnosis present

## 2021-05-21 DIAGNOSIS — N400 Enlarged prostate without lower urinary tract symptoms: Secondary | ICD-10-CM | POA: Diagnosis present

## 2021-05-21 DIAGNOSIS — Z20822 Contact with and (suspected) exposure to covid-19: Secondary | ICD-10-CM | POA: Diagnosis present

## 2021-05-21 DIAGNOSIS — Z833 Family history of diabetes mellitus: Secondary | ICD-10-CM | POA: Diagnosis not present

## 2021-05-21 DIAGNOSIS — I132 Hypertensive heart and chronic kidney disease with heart failure and with stage 5 chronic kidney disease, or end stage renal disease: Secondary | ICD-10-CM | POA: Diagnosis present

## 2021-05-21 DIAGNOSIS — Z841 Family history of disorders of kidney and ureter: Secondary | ICD-10-CM | POA: Diagnosis not present

## 2021-05-21 LAB — COMPREHENSIVE METABOLIC PANEL
ALT: 9 U/L (ref 0–44)
AST: 18 U/L (ref 15–41)
Albumin: 3.3 g/dL — ABNORMAL LOW (ref 3.5–5.0)
Alkaline Phosphatase: 64 U/L (ref 38–126)
Anion gap: 14 (ref 5–15)
BUN: 42 mg/dL — ABNORMAL HIGH (ref 6–20)
CO2: 32 mmol/L (ref 22–32)
Calcium: 8.6 mg/dL — ABNORMAL LOW (ref 8.9–10.3)
Chloride: 90 mmol/L — ABNORMAL LOW (ref 98–111)
Creatinine, Ser: 9.5 mg/dL — ABNORMAL HIGH (ref 0.61–1.24)
GFR, Estimated: 6 mL/min — ABNORMAL LOW (ref >60)
Glucose, Bld: 90 mg/dL (ref 70–99)
Potassium: 5.2 mmol/L — ABNORMAL HIGH (ref 3.5–5.1)
Sodium: 136 mmol/L (ref 135–145)
Total Bilirubin: 1.1 mg/dL (ref 0.3–1.2)
Total Protein: 6.7 g/dL (ref 6.5–8.1)

## 2021-05-21 LAB — URINALYSIS, ROUTINE W REFLEX MICROSCOPIC
Bilirubin Urine: NEGATIVE
Glucose, UA: NEGATIVE mg/dL
Hgb urine dipstick: NEGATIVE
Ketones, ur: NEGATIVE mg/dL
Nitrite: NEGATIVE
Protein, ur: >=300 mg/dL — AB
Specific Gravity, Urine: 1.012 (ref 1.005–1.030)
WBC, UA: >50 WBC/hpf — ABNORMAL HIGH (ref 0–5)
pH: 9 — ABNORMAL HIGH (ref 5.0–8.0)

## 2021-05-21 LAB — CBC
HCT: 34 % — ABNORMAL LOW (ref 39.0–52.0)
Hemoglobin: 11.1 g/dL — ABNORMAL LOW (ref 13.0–17.0)
MCH: 30.3 pg (ref 26.0–34.0)
MCHC: 32.6 g/dL (ref 30.0–36.0)
MCV: 92.9 fL (ref 80.0–100.0)
Platelets: 252 10*3/uL (ref 150–400)
RBC: 3.66 MIL/uL — ABNORMAL LOW (ref 4.22–5.81)
RDW: 14.6 % (ref 11.5–15.5)
WBC: 4.7 10*3/uL (ref 4.0–10.5)
nRBC: 0 % (ref 0.0–0.2)

## 2021-05-21 MED ORDER — CHLORHEXIDINE GLUCONATE CLOTH 2 % EX PADS
6.0000 | MEDICATED_PAD | Freq: Every day | CUTANEOUS | Status: DC
  Administered 2021-05-21 – 2021-05-27 (×6): 6 via TOPICAL

## 2021-05-21 MED ORDER — SODIUM CHLORIDE 0.9 % IV SOLN: 100.0000 mg | INTRAVENOUS | Status: DC

## 2021-05-21 MED ORDER — DOXERCALCIFEROL 4 MCG/2ML IV SOLN
2.0000 ug | INTRAVENOUS | Status: DC
  Administered 2021-05-24 – 2021-05-27 (×2): 2 ug via INTRAVENOUS
  Filled 2021-05-21 (×2): qty 2

## 2021-05-21 MED ORDER — LACTATED RINGERS IV SOLN: INTRAVENOUS | Status: AC

## 2021-05-21 NOTE — Consult Note (Addendum)
[name] Renal Consultation Note  Indication for Consultation:  Management of ESRD/hemodialysis; anemia, hypertension/volume and secondary hyperparathyroidism  HPI: Timothy [name] is a 60 y.o. male HO ESRD d/t  malignat HTN HD started at [facility] 09/15/19/17 hosp admit HTN urgency/uremia/pulm edema, reported history of colon cancer 2014 with resection in [place] polysubstance abuse - tobacco/alcohol/heroin medical noncompliance  Hep C antibody+ Now admitted with small bowel obstruction.  Reported abdominal pain 2 days ago has not missed dialysis treatment this week (TTS )but history of early HD treatment sign off.  He did denies any current chest pain shortness of breath fever chills nausea vomiting only his abdominal discomfort.  Note CT scan abdomen pelvis showed small bowel obstruction also right kidney with new lesion anterior aspect of interpole 3.7 x 2.9 x 2.6 cm "concerning for renal cell carcinoma" evaluation with nonemergent abdominal MRI in near future        Past Medical History:  Diagnosis Date   Anemia of chronic kidney failure    BPH (benign prostatic hyperplasia)    Colon cancer ([place]) 2014   End stage renal disease on dialysis ([place]) 2017   Hypertension    Hypertensive heart disease with chronic diastolic congestive heart failure ([facility]) 07/17/2016   Polysubstance abuse ([place])    History of heroin and marijuana use    Past Surgical History:  Procedure Laterality Date   ABDOMINAL SURGERY     AV FISTULA PLACEMENT Left 09/19/2016   Procedure: Left arm Radiocephalic ARTERIOVENOUS (AV) FISTULA CREATION;  Surgeon: Conrad [place], MD;  Location: [place];  Service: Vascular;  Laterality: Left;   [place] Left 07/09/2017   Procedure: BRACHIOCEPHALIC FISTULA CREATION;  Surgeon: Conrad [place], MD;  Location: [place];  Service: Vascular;  Laterality: Left;   COLON SURGERY  2014   INSERTION OF DIALYSIS CATHETER N/A 09/19/2016   Procedure: INSERTION OF  TUNNELED DIALYSIS CATHETER;  Surgeon: Conrad [place], MD;  Location: [place];  Service: Vascular;  Laterality: N/A;   IR GENERIC HISTORICAL  09/14/2016   IR US GUIDE VASC ACCESS RIGHT 09/14/2016 [name], DO [facility]INTER[facility]   IR GENERIC HISTORICAL  09/14/2016   IR FLUORO GUIDE CV LINE RIGHT 09/14/2016 [name], DO [facility]   ORIF TIBIA PLATEAU Left 01/15/2018   Procedure: OPEN REDUCTION INTERNAL FIXATION (ORIF) TIBIAL PLATEAU;  Surgeon: Altamese Greenock, MD;  Location: [place];  Service: Orthopedics;  Laterality: Left;   TRANSTHORACIC ECHOCARDIOGRAM  07/2016    EF 60-65%, No RWMA. Mod Concentric LVH - Gr 2 DD. Severe LA dilation. PAP ~35 mmHg (mild Pulm HTN)  --> no changes noted 1 month later      Family History  Problem Relation Age of Onset   Heart failure Mother        Died at age 33.   Heart attack Mother 16   Hypertension Mother    Diabetes Mellitus II Mother    Other Father        Unknown   Kidney failure Sister        (Oldest Sister)   Other Other        Multiple siblings have started her heart disease, he is not sure of the details.   CAD Nephew       reports that he quit smoking about 3 years ago. His smoking use included cigarettes. He smoked an average of .25 packs per day. He has never used smokeless tobacco. He reports current drug use. Frequency: 1.00 time  per week. Drugs: Marijuana and Heroin. He reports that he does not drink alcohol.   Allergies  Allergen Reactions   No Known Allergies     Prior to Admission medications   Medication Sig Start Date End Date Taking? Authorizing Provider  albuterol (PROVENTIL HFA;VENTOLIN HFA) 108 (90 Base) MCG/ACT inhaler Inhale 2 puffs into the lungs 3 (three) times daily as needed for shortness of breath. 01/08/18  Yes [provider]  diltiazem (CARDIZEM CD) 360 MG 24 hr capsule Take 360 mg by mouth daily. 06/19/17  Yes [provider]  lidocaine-prilocaine (EMLA) cream Apply 1 application topically See admin  instructions. Tuesday,thursday,Saturday prior to dialysis 05/12/21  Yes [provider]  oxyCODONE-acetaminophen (PERCOCET/ROXICET) 5-325 MG tablet Take 2 tablets by mouth every 4 (four) hours as needed for severe pain. 04/15/18  Yes Hedges, Dellis Filbert, PA-C  sevelamer carbonate (RENVELA) 800 MG tablet Take 2 tablets by mouth with breakfast, with lunch, and with evening meal. 05/12/21  Yes [provider]  amLODipine (NORVASC) 10 MG tablet Take 1 tablet (10 mg total) by mouth daily. Patient not taking: Reported on 05/20/2021 07/22/16   Elgergawy, Silver Huguenin, MD  docusate sodium (COLACE) 250 MG capsule Take 1 capsule (250 mg total) by mouth daily. Patient not taking: Reported on 05/20/2021 05/19/19   McDonald, Maree Erie A, PA-C  lactulose (CEPHULAC) 10 g packet Take 1 packet (10 g total) by mouth 3 (three) times daily. Patient not taking: Reported on 05/20/2021 05/19/19   Joanne Gavel, PA-C      Results for orders placed or performed during the hospital encounter of 05/19/21 (from the past 48 hour(s))  Urinalysis, Routine w reflex microscopic Urine, Clean Catch     Status: Abnormal   Collection Time: 05/19/21  8:15 PM  Result Value Ref Range   Color, Urine YELLOW YELLOW   APPearance HAZY (A) CLEAR   Specific Gravity, Urine 1.012 1.005 - 1.030   pH 9.0 (H) 5.0 - 8.0   Glucose, UA NEGATIVE NEGATIVE mg/dL   Hgb urine dipstick NEGATIVE NEGATIVE   Bilirubin Urine NEGATIVE NEGATIVE   Ketones, ur NEGATIVE NEGATIVE mg/dL   Protein, ur >=300 (A) NEGATIVE mg/dL   Nitrite NEGATIVE NEGATIVE   Leukocytes,Ua SMALL (A) NEGATIVE   RBC / HPF 0-5 0 - 5 RBC/hpf   WBC, UA >50 (H) 0 - 5 WBC/hpf   Bacteria, UA FEW (A) NONE SEEN   Squamous Epithelial / LPF 0-5 0 - 5   WBC Clumps PRESENT    Mucus PRESENT    Sperm, UA PRESENT     Comment: Performed at [facility], [address], [place], [place] [address]  CBC with Differential     Status: Abnormal   Collection Time: 05/19/21  8:49 PM  Result Value Ref  Range   WBC 4.9 4.0 - 10.5 K/uL   RBC 4.18 (L) 4.22 - 5.81 MIL/uL   Hemoglobin 12.5 (L) 13.0 - 17.0 g/dL   HCT 38.3 (L) 39.0 - 52.0 %   MCV 91.6 80.0 - 100.0 fL   MCH 29.9 26.0 - 34.0 pg   MCHC 32.6 30.0 - 36.0 g/dL   RDW 14.2 11.5 - 15.5 %   Platelets 279 150 - 400 K/uL   nRBC 0.0 0.0 - 0.2 %   Neutrophils Relative % 59 %   Neutro Abs 2.9 1.7 - 7.7 K/uL   Lymphocytes Relative 19 %   Lymphs Abs 0.9 0.7 - 4.0 K/uL   Monocytes Relative 22 %  Monocytes Absolute 1.1 (H) 0.1 - 1.0 K/uL   Eosinophils Relative 0 %   Eosinophils Absolute 0.0 0.0 - 0.5 K/uL   Basophils Relative 0 %   Basophils Absolute 0.0 0.0 - 0.1 K/uL   Immature Granulocytes 0 %   Abs Immature Granulocytes 0.02 0.00 - 0.07 K/uL    Comment: Performed at [facility], [place] [address], [place], [place] [address]  Comprehensive metabolic panel     Status: Abnormal   Collection Time: 05/19/21  8:49 PM  Result Value Ref Range   Sodium 134 (L) 135 - 145 mmol/L   Potassium 4.3 3.5 - 5.1 mmol/L   Chloride 87 (L) 98 - 111 mmol/L   CO2 33 (H) 22 - 32 mmol/L   Glucose, Bld 89 70 - 99 mg/dL    Comment: Glucose reference range applies only to samples taken after fasting for at least 8 hours.   BUN 20 6 - 20 mg/dL   Creatinine, Ser 6.65 (H) 0.61 - 1.24 mg/dL   Calcium 9.1 8.9 - 10.3 mg/dL   Total Protein 8.0 6.5 - 8.1 g/dL   Albumin 4.0 3.5 - 5.0 g/dL   AST 22 15 - 41 U/L   ALT 10 0 - 44 U/L   Alkaline Phosphatase 76 38 - 126 U/L   Total Bilirubin 0.8 0.3 - 1.2 mg/dL   GFR, Estimated 9 (L) >60 mL/min    Comment: (NOTE) Calculated using the CKD-EPI Creatinine Equation (2021)    Anion gap 14 5 - 15    Comment: Performed at [facility] [address] [place], [place] [address]  Lipase, blood     Status: None   Collection Time: 05/19/21  8:49 PM  Result Value Ref Range   Lipase 40 11 - 51 U/L    Comment: Performed at [facility], [place] [address] [place], [place] [address]  Resp Panel by RT-PCR (Flu  A&B, Covid) Nasopharyngeal Swab     Status: None   Collection Time: 05/20/21  7:10 AM   Specimen: Nasopharyngeal Swab; Nasopharyngeal(NP) swabs in vial transport medium  Result Value Ref Range   SARS Coronavirus 2 by RT PCR NEGATIVE NEGATIVE    Comment: (NOTE) SARS-CoV-2 target nucleic acids are NOT DETECTED.  The SARS-CoV-2 RNA is generally detectable in upper respiratory specimens during the acute phase of infection. The lowest concentration of SARS-CoV-2 viral copies this assay can detect is 138 copies/mL. A negative result does not preclude SARS-Cov-2 infection and should not be used as the sole basis for treatment or other patient management decisions. A negative result may occur with  improper specimen collection/handling, submission of specimen other than nasopharyngeal swab, presence of viral mutation(s) within the areas targeted by this assay, and inadequate number of viral copies(<138 copies/mL). A negative result must be combined with clinical observations, patient history, and epidemiological information. The expected result is Negative.  Fact Sheet for Patients:  EntrepreneurPulse.com.au  Fact Sheet for Healthcare Providers:  IncredibleEmployment.be  This test is no t yet approved or cleared by the [place] FDA and  has been authorized for detection and/or diagnosis of SARS-CoV-2 by FDA under an Emergency Use Authorization (EUA). This EUA will remain  in effect (meaning this test can be used) for the duration of the COVID-19 declaration under Section 564(b)(1) of the Act, 21 U.S.C.section 360bbb-3(b)(1), unless the authorization is terminated  or revoked sooner.       Influenza A by PCR NEGATIVE NEGATIVE  Influenza B by PCR NEGATIVE NEGATIVE    Comment: (NOTE) The Xpert Xpress SARS-CoV-2/FLU/RSV plus assay is intended as an aid in the diagnosis of influenza from Nasopharyngeal swab specimens and should not be used as a  sole basis for treatment. Nasal washings and aspirates are unacceptable for Xpert Xpress SARS-CoV-2/FLU/RSV testing.  Fact Sheet for Patients: EntrepreneurPulse.com.au  Fact Sheet for Healthcare Providers: IncredibleEmployment.be  This test is not yet approved or cleared by the [place] FDA and has been authorized for detection and/or diagnosis of SARS-CoV-2 by FDA under an Emergency Use Authorization (EUA). This EUA will remain in effect (meaning this test can be used) for the duration of the COVID-19 declaration under Section 564(b)(1) of the Act, 21 U.S.C. section 360bbb-3(b)(1), unless the authorization is terminated or revoked.  Performed at [facility], [place] [address], [place], [place] [address]   HIV Antibody (routine testing w rflx)     Status: None   Collection Time: 05/20/21  8:21 PM  Result Value Ref Range   HIV Screen 4th Generation wRfx Non Reactive Non Reactive    Comment: Performed at [facility], [place] [address] [place], [place] [address]  CBC     Status: Abnormal   Collection Time: 05/21/21  8:11 AM  Result Value Ref Range   WBC 4.7 4.0 - 10.5 K/uL   RBC 3.66 (L) 4.22 - 5.81 MIL/uL   Hemoglobin 11.1 (L) 13.0 - 17.0 g/dL   HCT 34.0 (L) 39.0 - 52.0 %   MCV 92.9 80.0 - 100.0 fL   MCH 30.3 26.0 - 34.0 pg   MCHC 32.6 30.0 - 36.0 g/dL   RDW 14.6 11.5 - 15.5 %   Platelets 252 150 - 400 K/uL   nRBC 0.0 0.0 - 0.2 %    Comment: Performed at [facility], [place] [address] [place], [place] [address]     ROS: See HPI   Physical Exam: Vitals:   05/21/21 0410 05/21/21 0803  BP: (!) 174/99 (!) 185/104  Pulse: 77 80  Resp: 18 19  Temp: 98.4 F (36.9 C) 98.7 F (37.1 C)  SpO2: 94% 94%     General: Alert thin, adult male, appropriate NAD HEENT: [place], nonicteric, muddy sclera, EOMI, MMM Neck: Supple, no JVD Heart: RRR 1/6 SEM, no rub or gallop Lungs: CTA, room air nonlabored breathing Abdomen: Bowel  sounds positive abdominal tenderness diffusely mildly hyperactive bowel sounds Extremities: No pedal edema Skin: Warm dry no pedal ulcers or overt rash noted Neuro: Alert O x3, moves all extremities no acute focal deficits appreciated Dialysis Access: Positive bruit left upper arm AV fistula  Dialysis Orders:[facility], 3 hrs 45 min, EDW EDW 66  /2K, 2 0 calcium bath UF P4 No heparin Hectorol 2 mics  Venofer 100 q. dialysis x10 last to be given 8/18 Mircera 30 last given 7/28   Assessment/Plan Small bowel obstruction= plans per admit team /CCS /noted history of colon cancer and colon resection, noted IV fluids going at 100 will decrease to 10 (in ESRD patient very little urine output risk volume overload with current rate of fluids and currently hypertensive) ESRD -HD TTS on schedule, K5.2 Hypertension/volume  -mild hypertension current weight 70.2 kg but bed weight needed DC IV fluids in  this ESRD patient /hemodialysis today Anemia  -Hgb 11.1, no needs for ESA currently last Mircera 30 given 7/28 hold Venofer load with current hemoglobin 11 follow-up trend Metabolic bone disease -calcium at goal, continue vitamin  D and binder when p.o.'s Right kidney lesion= questionable RCC radiology recommending MRI need to avoid MRI contrast in ESRD patient Nutrition -currently n.p.o.  Ernest Haber, PA-C [facility] Beeper [phone] 05/21/2021, 8:55 AM   I have seen and examined this patient and agree with plan and assessment in the above note with renal recommendations/intervention highlighted.  Pt pulled out his NGT and refused to have it replaced before dialysis.  "I can't do both".  His renal lesion will need to be evaluated by Urology as an outpatient.  Plan for HD today to keep on schedule.  Broadus John A Renaye Janicki,MD 05/21/2021 12:09 PM

## 2021-05-21 NOTE — Plan of Care (Signed)
  Problem: Education: Goal: Knowledge of General Education information will improve Description: Including pain rating scale, medication(s)/side effects and non-pharmacologic comfort measures Outcome: Progressing   Problem: Health Behavior/Discharge Planning: Goal: Ability to manage health-related needs will improve Outcome: Progressing   Problem: Clinical Measurements: Goal: Ability to maintain clinical measurements within normal limits will improve Outcome: Progressing Goal: Will remain free from infection Outcome: Progressing Goal: Diagnostic test results will improve 05/21/2021 1107 by Tobe Sos, RN Outcome: Progressing 05/21/2021 1107 by Tobe Sos, RN Outcome: Not Progressing Goal: Respiratory complications will improve Outcome: Progressing Goal: Cardiovascular complication will be avoided Outcome: Progressing   Problem: Activity: Goal: Risk for activity intolerance will decrease Outcome: Progressing   Problem: Nutrition: Goal: Adequate nutrition will be maintained Outcome: Progressing   Problem: Coping: Goal: Level of anxiety will decrease Outcome: Progressing   Problem: Elimination: Goal: Will not experience complications related to bowel motility Outcome: Progressing Goal: Will not experience complications related to urinary retention Outcome: Progressing   Problem: Pain Managment: Goal: General experience of comfort will improve Outcome: Progressing

## 2021-05-21 NOTE — Progress Notes (Signed)
Dr. Damita Dunnings at bedside; team spoke with patient, patient agreed to replace NG tube but wants it after HD. Noted.

## 2021-05-21 NOTE — Progress Notes (Signed)
HD#0 Subjective:  [name] is a 60 year old male with PMHx of ESRD on HD T/Th/Sat in setting of malignant hypertension, colon cancer s/p resection, HFpEF, and polysubstance use  presenting with abdominal pain and constipation admitted for small bowel obstruction.   Overnight Events: NG tube removed per patient request.    This morning, [name] was evaluated at bedside. He is resting comfortably in bed; however, does not ongoing abdominal discomfort   Objective:  Vital signs in last 24 hours: Vitals:   05/20/21 1830 05/20/21 1950 05/21/21 0000 05/21/21 0410  BP: (!) 184/104 (!) 190/104 (!) 185/101 (!) 174/99  Pulse: 72 72 70 77  Resp:  18 18 18   Temp:  98.2 F (36.8 C) 98.2 F (36.8 C) 98.4 F (36.9 C)  TempSrc:  Oral Oral Oral  SpO2: 93% 92% 93% 94%  Weight:      Height:       Supplemental O2: Room Air SpO2: 94 %   Physical Exam:  Physical Exam  Constitutional: Appears well-developed and well-nourished. No distress.  HENT: Normocephalic and atraumatic, EOMI, icteric sclerae, moist mucous membranes Cardiovascular: Normal rate, regular rhythm, S1 and S2 present, systolic murmur, no rubs/gallops. Distal pulses intact Respiratory: No respiratory distress, Effort is normal.  Lungs are clear to auscultation bilaterally. GI: Mildly distended with tenderness to palpation in epigastric region and bilateral lower quadrants, soft, tympanic, hypoactive bowel sounds Musculoskeletal: Normal bulk and tone.  No peripheral edema noted. AVF with palpable thrill on LUE   Filed Weights   05/19/21 2011  Weight: 70.2 kg     Intake/Output Summary (Last 24 hours) at 05/21/2021 0757 Last data filed at 05/20/2021 1931 Gross per 24 hour  Intake 1200 ml  Output --  Net 1200 ml   Net IO Since Admission: 1,200 mL [05/21/21 0757]  Pertinent Labs: CBC Latest Ref Rng & Units 05/19/2021 05/18/2019 01/17/2018  WBC 4.0 - 10.5 K/uL 4.9 8.1 12.6(H)  Hemoglobin 13.0 - 17.0 g/dL  12.5(L) 11.7(L) 9.5(L)  Hematocrit 39.0 - 52.0 % 38.3(L) 35.0(L) 28.2(L)  Platelets 150 - 400 K/uL 279 275 337    CMP Latest Ref Rng & Units 05/19/2021 05/18/2019 01/17/2018  Glucose 70 - 99 mg/dL 89 132(H) 129(H)  BUN 6 - 20 mg/dL 20 48(H) 78(H)  Creatinine 0.61 - 1.24 mg/dL 6.65(H) 8.67(H) 12.23(H)  Sodium 135 - 145 mmol/L 134(L) 133(L) 128(L)  Potassium 3.5 - 5.1 mmol/L 4.3 3.4(L) 4.1  Chloride 98 - 111 mmol/L 87(L) 94(L) 90(L)  CO2 22 - 32 mmol/L 33(H) 23 23  Calcium 8.9 - 10.3 mg/dL 9.1 8.9 8.6(L)  Total Protein 6.5 - 8.1 g/dL 8.0 7.5 -  Total Bilirubin 0.3 - 1.2 mg/dL 0.8 0.7 -  Alkaline Phos 38 - 126 U/L 76 52 -  AST 15 - 41 U/L 22 38 -  ALT 0 - 44 U/L 10 28 -    Imaging: DG Abd Portable 1V-Small Bowel Obstruction Protocol-initial, 8 hr delay  Result Date: 05/20/2021 CLINICAL DATA:  Bowel obstruction 8 hour follow-up EXAM: PORTABLE ABDOMEN - 1 VIEW COMPARISON:  05/20/2021, CT 05/20/2021 FINDINGS: Esophageal tube tip overlies proximal stomach. Enteral contrast in the stomach. Dilute contrast visible within dilated small bowel. No definite colon contrast is visualized. IMPRESSION: 1. Small amount of dilute contrast within dilated small bowel with no visible colon contrast. 2. No significant change in small bowel obstruction pattern. Electronically Signed   By: Donavan Foil M.D.   On: 05/20/2021 18:22  DG Abd Portable 1V  Result Date: 05/20/2021 CLINICAL DATA:  Document NG tube placement. EXAM: PORTABLE ABDOMEN - 1 VIEW COMPARISON:  Abdominal plain film May 20, 2021. CT of the abdomen and pelvis May 20, 2021. FINDINGS: NG tube in stable position. The side port approximately 4 cm above the GE junction. Small amount of contrast is present in the stomach. Persistent small bowel distension is noted throughout the abdomen compatible with small bowel obstruction. Visualized lung bases are clear. Postoperative changes in the RIGHT upper quadrant from cholecystectomy as before. On limited  assessment there is no acute skeletal process. IMPRESSION: NG tube in stable position, side port is approximately 4 cm above the GE junction. Consider advancement of 6-8 cm for more optimal placement. Electronically Signed   By: Zetta Bills M.D.   On: 05/20/2021 14:54   DG Abd Portable 1V-Small Bowel Protocol-Position Verification  Result Date: 05/20/2021 CLINICAL DATA:  In G-tube placed EXAM: PORTABLE ABDOMEN - 1 VIEW COMPARISON:  None. FINDINGS: Multiple dilated loops of small bowel. Nasogastric tube side port overlies the distal esophagus. No acute osseous abnormality. IMPRESSION: Nasogastric tube side port overlies the distal esophagus. Recommend advancement by 5.0 cm. Small bowel obstruction. These results will be called to the ordering clinician or representative by the Radiologist Assistant, and communication documented in the PACS or [employer]. Electronically Signed   By: Maurine Simmering   On: 05/20/2021 09:34    Assessment/Plan:   Principal Problem:   Small bowel obstruction ([place]) Active Problems:   HTN (hypertension)   ESRD on dialysis [facility])   Patient Summary: Timothy [name] is a 60 y.o. with a pertinent PMH of colon cancer s/p resection, ESRD on HD TThSat, malignant HTN, polysubstance use, who presented with abdominal pain with constipation and admitted for small bowel obstruction.    Small bowel obstruction  Patient presented with abdominal pain and constipation with imaging concerning for SBO. Overnight, patient unable to tolerate NG tube. He continues to have abdominal discomfort with distension. Repeat abdominal x-ray with persistent SBO. Discussed with patient who is agreeable with NG tube at this time - General surgery consulted, appreciate recommendations  - Re-insert NG tube - Maintain NPO status  - IVF 75cc/hr   ESRD on HD TThSat Hyperkalemia  - HD per nephrology   Severe asymptomatic hypertension SBP trending up 170-200's with DBP 99-115 this morning. Patient  has a history of malignant hypertension. He has received multiple doses of prn labetalol. Scheduled for HD today; hopefully this will help with hypertension. - HD per nephrology as above - Continue IV labetalol q2h prn SBP>180 or DBP>110  Diet: NPO IVF: LR,75cc/hr VTE: Heparin Code: Full PT/OT recs: Pending  Prior to Admission Living Arrangement: Home Anticipated Discharge Location: Home Barriers to Discharge: Continued medical management  Dispo: Anticipated discharge to Home in 3 days pending clinical improvement.   Harvie Heck, MD Internal Medicine Resident PGY-3 Pager# [phone]  Please contact the on call pager after 5 pm and on weekends at [phone].

## 2021-05-21 NOTE — Progress Notes (Signed)
Patient hasn't been to HD as of yet; patient given education re:NGT fro SBO. Agreed to have it placed. NG placed @ 4:30PM; xray order for confirmation.

## 2021-05-21 NOTE — Progress Notes (Signed)
MD aware of unsuccessful NGT insertion.

## 2021-05-21 NOTE — Progress Notes (Signed)
Patient started gagging when RN came in and stated that Ngtube is in his throat. Patient pulled NG out. Reinserted; but patient unable to tolerate; started gagging and pulled out NG.

## 2021-05-21 NOTE — Progress Notes (Signed)
Subjective: Patient pulled his NGT out overnight bc it was annoying him.  No flatus or BM.  No nausea currently  ROS: See above, otherwise other systems negative  Objective: Vital signs in last 24 hours: Temp:  [98 F (36.7 C)-98.7 F (37.1 C)] 98 F (36.7 C) (08/06 1155) Pulse Rate:  [68-148] 84 (08/06 1155) Resp:  [18-22] 19 (08/06 1155) BP: (174-201)/(91-115) 200/115 (08/06 1155) SpO2:  [81 %-100 %] 93 % (08/06 1155) Last BM Date: 05/20/21  Intake/Output from previous day: 08/05 0701 - 08/06 0700 In: 1200  Out: -  Intake/Output this shift: No intake/output data recorded.  PE: Abd: still with some mild distention and some tympany, hypoactive BS, not really tender  Lab Results:  Recent Labs    05/19/21 2049 05/21/21 0811  WBC 4.9 4.7  HGB 12.5* 11.1*  HCT 38.3* 34.0*  PLT 279 252   BMET Recent Labs    05/19/21 2049 05/21/21 0811  NA 134* 136  K 4.3 5.2*  CL 87* 90*  CO2 33* 32  GLUCOSE 89 90  BUN 20 42*  CREATININE 6.65* 9.50*  CALCIUM 9.1 8.6*   PT/INR No results for input(s): LABPROT, INR in the last 72 hours. CMP     Component Value Date/Time   NA 136 05/21/2021 0811   K 5.2 (H) 05/21/2021 0811   CL 90 (L) 05/21/2021 0811   CO2 32 05/21/2021 0811   GLUCOSE 90 05/21/2021 0811   BUN 42 (H) 05/21/2021 0811   CREATININE 9.50 (H) 05/21/2021 0811   CREATININE 1.59 (H) 07/29/2015 0943   CALCIUM 8.6 (L) 05/21/2021 0811   CALCIUM 8.4 (L) 09/14/2016 1615   PROT 6.7 05/21/2021 0811   ALBUMIN 3.3 (L) 05/21/2021 0811   AST 18 05/21/2021 0811   ALT 9 05/21/2021 0811   ALKPHOS 64 05/21/2021 0811   BILITOT 1.1 05/21/2021 0811   GFRNONAA 6 (L) 05/21/2021 0811   GFRNONAA 48 (L) 07/29/2015 0943   GFRAA 7 (L) 05/18/2019 2220   GFRAA 56 (L) 07/29/2015 0943   Lipase     Component Value Date/Time   LIPASE 40 05/19/2021 2049       Studies/Results: CT ABDOMEN PELVIS W CONTRAST  Result Date: 05/20/2021 CLINICAL DATA:  60 year old male with  history of abdominal distension and periumbilical pain. EXAM: CT ABDOMEN AND PELVIS WITH CONTRAST TECHNIQUE: Multidetector CT imaging of the abdomen and pelvis was performed using the standard protocol following bolus administration of intravenous contrast. CONTRAST:  32mL OMNIPAQUE IOHEXOL 300 MG/ML  SOLN COMPARISON:  CT the abdomen and pelvis 05/19/2019. FINDINGS: Lower chest: Linear areas of scarring are noted in the lung bases bilaterally, most pronounced in the left lower lobe. Cardiomegaly. Atherosclerotic calcifications in the left circumflex and right coronary arteries. Calcifications of the aortic valve. Hepatobiliary: No suspicious cystic or solid hepatic lesions. No intra or extrahepatic biliary ductal dilatation. 1.6 cm predominantly noncalcified gallstone lies dependently in the gallbladder. Gallbladder is only moderately distended without definite wall thickening. Pancreas: No definite pancreatic mass. Pancreatic duct is diffusely dilated measuring up to 8 mm in the pancreatic head and body. No definite peripancreatic fluid collections or inflammatory changes. Spleen: 1.9 x 1.2 cm low-attenuation lesion in the superior aspect of the spleen (axial image 20 of series 3), nonspecific, but favored to represent a cyst. Adrenals/Urinary Tract: Kidneys are atrophic bilaterally and contain innumerable small low-attenuation lesions, most of which are too small to characterize, but favored to represent cysts. Some other intermediate  attenuation lesions are also noted, also generally too small to characterize. In addition, in the anterior aspect of the interpolar region of the right kidney (axial image 40 of series 3 and coronal image 93 of series 6) there is a 3.7 x 2.9 x 2.6 cm ill-defined mess which is heterogeneous in attenuation with extensive mural thickening (particularly anterolaterally), but new compared to prior examination 05/19/2019, concerning for primary renal neoplasm such as renal cell carcinoma.  No hydroureteronephrosis. Urinary bladder is normal in appearance. Stomach/Bowel: The appearance of the stomach is normal. Status post right hemicolectomy. Remaining portions of the colon and rectum appear decompressed. There are multiple dilated loops of small bowel measuring up to 4.5 cm in diameter, with multiple air-fluid levels. An exact transition point is not confidently identified on today's examination. Vascular/Lymphatic: Aortic atherosclerosis, without evidence of aneurysm or dissection in the abdominal or pelvic vasculature. No lymphadenopathy noted in the abdomen or pelvis. Reproductive: Prostate gland and seminal vesicles are unremarkable in appearance. Other: Small volume of ascites. Diffuse mesenteric edema. No pneumoperitoneum. Musculoskeletal: There are no aggressive appearing lytic or blastic lesions noted in the visualized portions of the skeleton. IMPRESSION: 1. Findings are concerning for small bowel obstruction. An exact transition point is not confidently identified on today's examination. 2. Small volume of ascites. 3. 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 This is of uncertain etiology and significance. 4. Severe atrophy of the kidneys bilaterally with numerous small lesions, most of which are favored to represent a combination of simple and proteinaceous/hemorrhagic cysts. The exception is a new lesion in the anterior aspect of the interpolar region of the right kidney which measures 3.7 x 2.9 x 2.6 cm which is highly concerning for a renal cell carcinoma. Further evaluation with nonemergent abdominal MRI with and without IV gadolinium is recommended in the near future to better evaluate this finding. 5. New small cystic lesion in the superior aspect of the spleen, indeterminate, but favored to be benign. Attention at time of  forthcoming MRI examination is recommended. 6. Cholelithiasis without definitive evidence to suggest acute cholecystitis. 7. Aortic atherosclerosis, in addition to least 2 vessel coronary artery disease. Please note that although the presence of coronary artery calcium documents the presence of coronary artery disease, the severity of this disease and any potential stenosis cannot be assessed on this non-gated CT examination. Assessment for potential risk factor modification, dietary therapy or pharmacologic therapy may be warranted, if clinically indicated. 8. There are calcifications of the aortic valve. Echocardiographic correlation for evaluation of potential valvular dysfunction may be warranted if clinically indicated. 9. Additional incidental findings, as above. Electronically Signed   By: Vinnie Langton M.D.   On: 05/20/2021 06:08   DG Abd Portable 1V-Small Bowel Obstruction Protocol-initial, 8 hr delay  Result Date: 05/20/2021 CLINICAL DATA:  Bowel obstruction 8 hour follow-up EXAM: PORTABLE ABDOMEN - 1 VIEW COMPARISON:  05/20/2021, CT 05/20/2021 FINDINGS: Esophageal tube tip overlies proximal stomach. Enteral contrast in the stomach. Dilute contrast visible within dilated small bowel. No definite colon contrast is visualized. IMPRESSION: 1. Small amount of dilute contrast within dilated small bowel with no visible colon contrast. 2. No significant change in small bowel obstruction pattern. Electronically Signed   By: Donavan Foil M.D.   On: 05/20/2021 18:22   DG Abd Portable 1V  Result Date: 05/20/2021 CLINICAL DATA:  Document NG tube placement. EXAM: PORTABLE ABDOMEN -  1 VIEW COMPARISON:  Abdominal plain film May 20, 2021. CT of the abdomen and pelvis May 20, 2021. FINDINGS: NG tube in stable position. The side port approximately 4 cm above the GE junction. Small amount of contrast is present in the stomach. Persistent small bowel distension is noted throughout the abdomen compatible with  small bowel obstruction. Visualized lung bases are clear. Postoperative changes in the RIGHT upper quadrant from cholecystectomy as before. On limited assessment there is no acute skeletal process. IMPRESSION: NG tube in stable position, side port is approximately 4 cm above the GE junction. Consider advancement of 6-8 cm for more optimal placement. Electronically Signed   By: Zetta Bills M.D.   On: 05/20/2021 14:54   DG Abd Portable 1V-Small Bowel Protocol-Position Verification  Result Date: 05/20/2021 CLINICAL DATA:  In G-tube placed EXAM: PORTABLE ABDOMEN - 1 VIEW COMPARISON:  None. FINDINGS: Multiple dilated loops of small bowel. Nasogastric tube side port overlies the distal esophagus. No acute osseous abnormality. IMPRESSION: Nasogastric tube side port overlies the distal esophagus. Recommend advancement by 5.0 cm. Small bowel obstruction. These results will be called to the ordering clinician or representative by the Radiologist Assistant, and communication documented in the PACS or [employer]. Electronically Signed   By: Maurine Simmering   On: 05/20/2021 09:34    Anti-infectives: Anti-infectives (From admission, onward)    None        Assessment/Plan Small bowel obstruction - repeat plain film today.  8 hr delay still showed persistent SBO -he is agreeable to replace NGT if film still shows SBO -cont NPO     FEN: NPO, NGT pending film results, IVF per primary ID: none VTE: okay for chemical prophylaxis from our standpoint   ESRD - dialysis HTN   LOS: 0 days    Henreitta Cea , [facility] Surgery 05/21/2021, 12:12 PM Please see Amion for pager number during day hours 7:00am-4:30pm or 7:00am -11:30am on weekends

## 2021-05-22 ENCOUNTER — Inpatient Hospital Stay (HOSPITAL_COMMUNITY): Payer: Medicare HMO

## 2021-05-22 DIAGNOSIS — K56609 Unspecified intestinal obstruction, unspecified as to partial versus complete obstruction: Secondary | ICD-10-CM | POA: Diagnosis not present

## 2021-05-22 LAB — RENAL FUNCTION PANEL
Albumin: 3.2 g/dL — ABNORMAL LOW (ref 3.5–5.0)
Anion gap: 10 (ref 5–15)
BUN: 18 mg/dL (ref 6–20)
CO2: 29 mmol/L (ref 22–32)
Calcium: 8 mg/dL — ABNORMAL LOW (ref 8.9–10.3)
Chloride: 97 mmol/L — ABNORMAL LOW (ref 98–111)
Creatinine, Ser: 5.33 mg/dL — ABNORMAL HIGH (ref 0.61–1.24)
GFR, Estimated: 12 mL/min — ABNORMAL LOW (ref >60)
Glucose, Bld: 75 mg/dL (ref 70–99)
Phosphorus: 3.7 mg/dL (ref 2.5–4.6)
Potassium: 4 mmol/L (ref 3.5–5.1)
Sodium: 136 mmol/L (ref 135–145)

## 2021-05-22 LAB — CBC
HCT: 33 % — ABNORMAL LOW (ref 39.0–52.0)
Hemoglobin: 10.7 g/dL — ABNORMAL LOW (ref 13.0–17.0)
MCH: 30.5 pg (ref 26.0–34.0)
MCHC: 32.4 g/dL (ref 30.0–36.0)
MCV: 94 fL (ref 80.0–100.0)
Platelets: 227 10*3/uL (ref 150–400)
RBC: 3.51 MIL/uL — ABNORMAL LOW (ref 4.22–5.81)
RDW: 14.6 % (ref 11.5–15.5)
WBC: 4.6 10*3/uL (ref 4.0–10.5)
nRBC: 0 % (ref 0.0–0.2)

## 2021-05-22 LAB — TROPONIN I (HIGH SENSITIVITY)
Troponin I (High Sensitivity): 180 ng/L (ref <18)
Troponin I (High Sensitivity): 197 ng/L (ref <18)

## 2021-05-22 MED ORDER — NITROGLYCERIN IN D5W 200-5 MCG/ML-% IV SOLN: 0.0000 ug/min | INTRAVENOUS | Status: DC

## 2021-05-22 MED ORDER — DIATRIZOATE MEGLUMINE & SODIUM 66-10 % PO SOLN
90.0000 mL | Freq: Once | ORAL | Status: AC
  Administered 2021-05-22: 90 mL via NASOGASTRIC
  Filled 2021-05-22: qty 90

## 2021-05-22 MED ORDER — MENTHOL 3 MG MT LOZG: 1.0000 | LOZENGE | OROMUCOSAL | Status: DC | PRN

## 2021-05-22 MED ORDER — PHENOL 1.4 % MT LIQD
1.0000 | OROMUCOSAL | Status: DC | PRN
  Administered 2021-05-23: 1 via OROMUCOSAL
  Filled 2021-05-22: qty 177

## 2021-05-22 NOTE — Progress Notes (Signed)
Patient evaluated earlier after reporting chest pain that was throbbing and left sided. He received labetalol for SBP >200 which patient reported improved his pain. Since then this provider has received an alert for critical troponin of 180 though no EKG was in system at that time. Repeat troponin of 197. Patient states that he is no longer experiencing chest pain and is not having shortness of breath. His only pain is in his abdomen.   Physical exam: Afebrile, RR 17 HR 71 BP 193/120, Sp02 96% on room air General: Resting comfortably in no acute distress CV: RRR, no murmurs/rubs/gallops Pulm: Normal work of breathing on room air, CTA bilaterally Neuro: awake, alert, normal conversation  A/P: Timothy [name] is admitted for small bowel obstruction and severe asymptomatic hypertension. He denies ongoing chest pain at this time. Troponins have been 18:50 180, 20:43 197. KUB pending. EKG showed nonspecific T wave changes. Spoke with cardiology who recommended starting patient on a nitro drip. After speaking with Dr. Evette Doffing and reviewing history of this episode it seems that this falls under asymptomatic severe hypertension rather than acute ischemia. Will continue to monitor troponin levels.  Farrel Gordon, DO Internal Medicine PGY-1 [phone]

## 2021-05-22 NOTE — Progress Notes (Signed)
Pt's blood pressures elevated, 10mg  Labetolol given. After giving Labetalol, pt reported chest pain for past hour. Pt described pain as aching, rating 5/10. Teaching service has been paged.

## 2021-05-22 NOTE — Progress Notes (Addendum)
PROGRESS NOTE:  Paged by RN patient having chest pain. This provider and Dr. Humphrey Rolls evaluated patient at bedside. Patient reports onset of chest pain ~30 minutes ago. Describes a throbbing sensation on the left side of his chest. RN gave labetalol for SBP>200 and patient reports improvement of pain. In addition, he notes the pain worsens with movement. Pain is not reproducible by palpitation. He now notes the pain is down in his abdomen. He also mentions his abdomen feels more distended than earlier today. Denies dyspnea, but does report some nausea.  Physical Exam: Afebrile, HR 80, BP 195/107, SpO2 97% on room air General: Resting comfortably in no acute distress CV: Regular rate, rhythm. Systolic murmur appreciated. Pulm: Normal work of breathing on room air. Clear to ausculation bilaterally GI: Soft, mildly distended, tenderness to palpation in LUQ MSK: Normal bulk, tone.  Neuro: Awake, alert, conversing appropriately.  A/P: Timothy [name] is admitted for small bowel obstruction as well as severe asymptomatic hypertension. Most likely pain 2/2 small bowel obstruction, but given uncontrolled hypertension, will obtain EKG and troponins. In addition, he reports his abdomen is more distended than previously. Will also obtain KUB at this time. - EKG - Troponins - KUB  Sanjuan Dame, MD Internal Medicine PGY-2 [phone]

## 2021-05-22 NOTE — Progress Notes (Signed)
Subjective: Patient continues to pull his NGT out again overnight, but complains of increasing abdominal distention with no flatus or BM.  ROS: See above, otherwise other systems negative  Objective: Vital signs in last 24 hours: Temp:  [98 F (36.7 C)-99.1 F (37.3 C)] 99.1 F (37.3 C) (08/07 0820) Pulse Rate:  [73-84] 82 (08/07 0820) Resp:  [13-20] 17 (08/07 0820) BP: (174-215)/(97-115) 194/113 (08/07 0820) SpO2:  [93 %-99 %] 98 % (08/07 0820) Weight:  [66.3 kg] 66.3 kg (08/06 2015) Last BM Date: 05/20/21  Intake/Output from previous day: 08/06 0701 - 08/07 0700 In: 0  Out: 274  Intake/Output this shift: No intake/output data recorded.  PE: Abd: still with some mild distention and some tympany, hypoactive BS, not really tender, NGT reinserted by myself with immediate bilious output  Lab Results:  Recent Labs    05/21/21 0811 05/22/21 0303  WBC 4.7 4.6  HGB 11.1* 10.7*  HCT 34.0* 33.0*  PLT 252 227   BMET Recent Labs    05/21/21 0811 05/22/21 0303  NA 136 136  K 5.2* 4.0  CL 90* 97*  CO2 32 29  GLUCOSE 90 75  BUN 42* 18  CREATININE 9.50* 5.33*  CALCIUM 8.6* 8.0*   PT/INR No results for input(s): LABPROT, INR in the last 72 hours. CMP     Component Value Date/Time   NA 136 05/22/2021 0303   K 4.0 05/22/2021 0303   CL 97 (L) 05/22/2021 0303   CO2 29 05/22/2021 0303   GLUCOSE 75 05/22/2021 0303   BUN 18 05/22/2021 0303   CREATININE 5.33 (H) 05/22/2021 0303   CREATININE 1.59 (H) 07/29/2015 0943   CALCIUM 8.0 (L) 05/22/2021 0303   CALCIUM 8.4 (L) 09/14/2016 1615   PROT 6.7 05/21/2021 0811   ALBUMIN 3.2 (L) 05/22/2021 0303   AST 18 05/21/2021 0811   ALT 9 05/21/2021 0811   ALKPHOS 64 05/21/2021 0811   BILITOT 1.1 05/21/2021 0811   GFRNONAA 12 (L) 05/22/2021 0303   GFRNONAA 48 (L) 07/29/2015 0943   GFRAA 7 (L) 05/18/2019 2220   GFRAA 56 (L) 07/29/2015 0943   Lipase     Component Value Date/Time   LIPASE 40 05/19/2021 2049        Studies/Results: DG Abd Portable 1V  Result Date: 05/21/2021 CLINICAL DATA:  Nasogastric tube placement EXAM: PORTABLE ABDOMEN - 1 VIEW COMPARISON:  None. FINDINGS: Nasogastric tube tip is seen just beyond the gastroesophageal junction within the gastric cardia and the proximal side hole is seen within the distal esophagus. Multiple gas-filled dilated loops of bowel are seen throughout the abdomen in keeping with a distal partial small bowel obstruction. Contrast from prior CT examination resides in several distal loops of small bowel. Pelvis excluded from view. No organomegaly. Cardiac size is mildly enlarged. IMPRESSION: Nasogastric tube tip just beyond the gastroesophageal junction. Advancement of the catheter by 10-15 cm would better position the catheter within the gastric lumen. Electronically Signed   By: Fidela Salisbury MD   On: 05/21/2021 17:06   DG Abd Portable 1V  Result Date: 05/21/2021 CLINICAL DATA:  Small-bowel obstruction followup. LOWER abdominal pain and abdominal distention. EXAM: PORTABLE ABDOMEN - 1 VIEW COMPARISON:  05/20/2021 FINDINGS: Nasogastric tube has been removed or withdrawn into the proximal esophagus. Small amount of dilute contrast is identified within small bowel loops in the LEFT mid abdomen. There is significant persistent dilatation of numerous small bowel loops throughout the abdomen and pelvis. No definitive  large bowel contrast. IMPRESSION: 1. Interval removal or withdrawal of nasogastric tube. 2. Dilute contrast throughout very dilated loops of small bowel, similar in appearance to prior study. No definitive large bowel contrast. Findings are consistent with persistent small-bowel obstruction. Electronically Signed   By: Nolon Nations M.D.   On: 05/21/2021 13:25   DG Abd Portable 1V-Small Bowel Obstruction Protocol-initial, 8 hr delay  Result Date: 05/20/2021 CLINICAL DATA:  Bowel obstruction 8 hour follow-up EXAM: PORTABLE ABDOMEN - 1 VIEW COMPARISON:   05/20/2021, CT 05/20/2021 FINDINGS: Esophageal tube tip overlies proximal stomach. Enteral contrast in the stomach. Dilute contrast visible within dilated small bowel. No definite colon contrast is visualized. IMPRESSION: 1. Small amount of dilute contrast within dilated small bowel with no visible colon contrast. 2. No significant change in small bowel obstruction pattern. Electronically Signed   By: Donavan Foil M.D.   On: 05/20/2021 18:22   DG Abd Portable 1V  Result Date: 05/20/2021 CLINICAL DATA:  Document NG tube placement. EXAM: PORTABLE ABDOMEN - 1 VIEW COMPARISON:  Abdominal plain film May 20, 2021. CT of the abdomen and pelvis May 20, 2021. FINDINGS: NG tube in stable position. The side port approximately 4 cm above the GE junction. Small amount of contrast is present in the stomach. Persistent small bowel distension is noted throughout the abdomen compatible with small bowel obstruction. Visualized lung bases are clear. Postoperative changes in the RIGHT upper quadrant from cholecystectomy as before. On limited assessment there is no acute skeletal process. IMPRESSION: NG tube in stable position, side port is approximately 4 cm above the GE junction. Consider advancement of 6-8 cm for more optimal placement. Electronically Signed   By: Zetta Bills M.D.   On: 05/20/2021 14:54    Anti-infectives: Anti-infectives (From admission, onward)    None        Assessment/Plan Small bowel obstruction - NGT reinserted by myself.  Confirmation film pending, but immediate bilious output -retry SBO protocol since he really hasn't had consistent therapy since admission.     FEN: NPO, NGT/SBO protocol  ID: none VTE: okay for chemical prophylaxis from our standpoint   ESRD - dialysis HTN   LOS: 1 day    Henreitta Cea , [facility] Surgery 05/22/2021, 10:48 AM Please see Amion for pager number during day hours 7:00am-4:30pm or 7:00am -11:30am on weekends

## 2021-05-22 NOTE — Progress Notes (Addendum)
   Subjective: No acute overnight events. Patient was seen at bedside during rounds this morning. Pt reports feeling about the same this AM. Pt has ongoing abdominal discomfort; no further chest pain at this time which he attributes to high blood pressures. No bowel movement or flatus yet.    Objective:  Vital signs in last 24 hours: Vitals:   05/22/21 2013 05/22/21 2103 05/22/21 2353 05/23/21 0520  BP: (!) 203/110 (!) 193/120 (!) 191/103 (!) 194/101  Pulse: 80 71 75 74  Resp: 19 17 16 17   Temp:  97.8 F (36.6 C) 97.9 F (36.6 C) 97.8 F (36.6 C)  TempSrc: Axillary Axillary Axillary Axillary  SpO2: 97% 96% 97% 95%  Weight:      Height:       Constitutional: alert, well-appearing, in no acute distress Cardiovascular: regular rate and rhythm, no m/r/g Pulmonary/Chest: normal work of breathing on room air, lungs clear to auscultation bilaterally Abdominal: soft, tenderness to palpation, non-distended MSK: normal bulk and tone Neurological: alert & oriented x 3  Skin: warm and dry  Assessment/Plan:  Principal Problem:   Small bowel obstruction (HCC) Active Problems:   HTN (hypertension)   ESRD on dialysis (HCC)  Timothy [name] is a 60 y.o. with a pertinent PMH of colon cancer s/p resection, ESRD on HD TThSat, malignant HTN, polysubstance use, who presented with abdominal pain with constipation and admitted for small bowel obstruction.  Small bowel obstruction Patient complaining of abdominal pain, distention and constipation, with persistent SBO on imaging. Tolerated NG tube overnight, with continued NG output, net 2000 mL. Denies flatus or BM. He is afebrile. General surgery plans for ex lap today for lysis of adhesions vs small bowel resection.  - Maintain NPO status - IVF 75cc/hr - Dilaudid increased from 1 mg to 1.5 mg q4h prn for ongoing abdominal pain.    Severe asymptomatic hypertension Patient had lefts sided throbbing CP that resolved after labetalol with BP >200,  trops 180, 197, and 195. EKG showed nonspecific T wave changes. Consistent with asymptomatic severe hypertension. SBP 190-200 this AM.  Patient has a history of malignant hypertension. He has only received 2 doses of IV labetalol yesterday and 1 dose today thus far.  - HD per nephrology as above, due to HD tmrw.  - Continue IV labetalol q2h prn SBP>180 or DBP>110   ESRD on HD TThSat - HD per nephrology   Signature: Lajean Manes, MD  Internal Medicine Resident, PGY-1 [facility]  Pager: [phone] 6:29 AM, 05/23/2021  After 5pm on weekdays and 1pm on weekends: On Call pager [phone]

## 2021-05-22 NOTE — Progress Notes (Signed)
MD at bedside. 

## 2021-05-22 NOTE — Progress Notes (Addendum)
Subjective: Noted pulled NG tube out overnight again and CCS replaced today, has in on rounds, had dialysis late last night tolerated no UF.  Objective Vital signs in last 24 hours: Vitals:   05/22/21 0015 05/22/21 0031 05/22/21 0414 05/22/21 0820  BP: (!) 182/100 (!) 187/103 (!) 193/112 (!) 194/113  Pulse: 82 80 84 82  Resp:  20 18 17   Temp: 98.2 F (36.8 C) 98.3 F (36.8 C) 98.2 F (36.8 C) 99.1 F (37.3 C)  TempSrc: Oral Axillary Axillary Oral  SpO2:  95% 96% 98%  Weight:      Height:       Weight change:   Physical Exam: General: Alert thin, adult male, appropriate NAD, NG tube in place Heart: RRR 1/6 SEM, no rub or gallop Lungs: CTA, room air nonlabored breathing Abdomen: Bowel sounds positive with NG tube in place ,t mild distention, tenderness diffusely improved  Extremities: No pedal edema Dialysis Access: Positive bruit left upper arm AV fistula   Dialysis Orders:[facility], 3 hrs 45 min, EDW EDW 66  /2K, 2 0 calcium bath UF P4 No heparin Hectorol 2 mics Venofer 100 q. dialysis x10 last to be given 8/18 Mircera 30 last given 7/28    Problem/Plan: Small bowel obstruction= plans per admit team /CCS /noted history of colon cancer and colon resection ESRD -HD TTS on schedule late last night.  A.m. K4.0 Hypertension/volume  -mild hypertension current weight  66.3 <70.2 kg but bed weight.  At dry weight  Anemia  -Hgb 11.1> 10.7 no needs for ESA currently last Mircera 30 given 7/28 hold Venofer load with current hemoglobin 11 follow-up trend Metabolic bone disease -calcium at goal, continue vitamin D and binder when p.o.'s Right kidney lesion= questionable [facility] radiology recommending MRI need to avoid MRI contrast in ESRD patient, follow-up as outpatient Nutrition -currently n.p.o.  Ernest Haber, PA-C [facility] [phone] 05/22/2021,11:46 AM  LOS: 1 day   Labs: Basic Metabolic Panel: Recent Labs  Lab 05/19/21 2049 05/21/21 0811 05/22/21 0303   NA 134* 136 136  K 4.3 5.2* 4.0  CL 87* 90* 97*  CO2 33* 32 29  GLUCOSE 89 90 75  BUN 20 42* 18  CREATININE 6.65* 9.50* 5.33*  CALCIUM 9.1 8.6* 8.0*  PHOS  --   --  3.7   Liver Function Tests: Recent Labs  Lab 05/19/21 2049 05/21/21 0811 05/22/21 0303  AST 22 18  --   ALT 10 9  --   ALKPHOS 76 64  --   BILITOT 0.8 1.1  --   PROT 8.0 6.7  --   ALBUMIN 4.0 3.3* 3.2*   Recent Labs  Lab 05/19/21 2049  LIPASE 40   No results for input(s): AMMONIA in the last 168 hours. CBC: Recent Labs  Lab 05/19/21 2049 05/21/21 0811 05/22/21 0303  WBC 4.9 4.7 4.6  NEUTROABS 2.9  --   --   HGB 12.5* 11.1* 10.7*  HCT 38.3* 34.0* 33.0*  MCV 91.6 92.9 94.0  PLT 279 252 227    Medications:   Chlorhexidine Gluconate Cloth  6 each Topical Q0600   diatrizoate meglumine-sodium  90 mL Per NG tube Once   [START ON 05/24/2021] doxercalciferol  2 mcg Intravenous Q T,Th,Sa-HD   heparin  5,000 Units Subcutaneous Q8H    I have seen and examined this patient and agree with plan and assessment in the above note with renal recommendations/intervention highlighted.  Continue with HD on TTS schedule while he remains  an inpatient.  Governor Rooks Jamy Whyte,MD 05/22/2021 3:40 PM

## 2021-05-22 NOTE — Progress Notes (Addendum)
HD#1 Subjective:  Timothy [name] is a 60 year old male with PMHx of ESRD on HD T/Th/Sat in setting of malignant hypertension, colon cancer s/p resection, HFpEF, and polysubstance use  presenting with abdominal pain and constipation admitted for small bowel obstruction.   Overnight Events: Patient removed NG tube.    This morning, Timothy [name] was evaluated at bedside. He endorses ongoing abdominal discomfort and distension. Not passing flatus and has not had a bowel movement yet. Expresses frustration with ongoing abdominal pain/distension and not being able to tolerate NG tube.   Objective:  Vital signs in last 24 hours: Vitals:   05/22/21 0015 05/22/21 0031 05/22/21 0414 05/22/21 0820  BP: (!) 182/100 (!) 187/103 (!) 193/112 (!) 194/113  Pulse: 82 80 84 82  Resp:  20 18 17   Temp: 98.2 F (36.8 C) 98.3 F (36.8 C) 98.2 F (36.8 C) 99.1 F (37.3 C)  TempSrc: Oral Axillary Axillary Oral  SpO2:  95% 96% 98%  Weight:      Height:       Supplemental O2: Room Air SpO2: 98 %   Physical Exam:  Physical Exam  Constitutional: Appears well-developed and well-nourished. No distress.  Respiratory: No respiratory distress, Effort is normal.   GI: Mildly distended with tenderness to palpation in epigastric region and bilateral lower quadrants, soft, tympanic, hypoactive bowel sounds Musculoskeletal: Normal bulk and tone.  No peripheral edema noted. AVF with palpable thrill on LUE   Filed Weights   05/19/21 2011 05/21/21 2015  Weight: 70.2 kg 66.3 kg     Intake/Output Summary (Last 24 hours) at 05/22/2021 1131 Last data filed at 05/22/2021 0015 Gross per 24 hour  Intake 0 ml  Output 274 ml  Net -274 ml   Net IO Since Admission: 926 mL [05/22/21 1131]  Pertinent Labs: CBC Latest Ref Rng & Units 05/22/2021 05/21/2021 05/19/2021  WBC 4.0 - 10.5 K/uL 4.6 4.7 4.9  Hemoglobin 13.0 - 17.0 g/dL 10.7(L) 11.1(L) 12.5(L)  Hematocrit 39.0 - 52.0 % 33.0(L) 34.0(L) 38.3(L)  Platelets  150 - 400 K/uL 227 252 279    CMP Latest Ref Rng & Units 05/22/2021 05/21/2021 05/19/2021  Glucose 70 - 99 mg/dL 75 90 89  BUN 6 - 20 mg/dL 18 42(H) 20  Creatinine 0.61 - 1.24 mg/dL 5.33(H) 9.50(H) 6.65(H)  Sodium 135 - 145 mmol/L 136 136 134(L)  Potassium 3.5 - 5.1 mmol/L 4.0 5.2(H) 4.3  Chloride 98 - 111 mmol/L 97(L) 90(L) 87(L)  CO2 22 - 32 mmol/L 29 32 33(H)  Calcium 8.9 - 10.3 mg/dL 8.0(L) 8.6(L) 9.1  Total Protein 6.5 - 8.1 g/dL - 6.7 8.0  Total Bilirubin 0.3 - 1.2 mg/dL - 1.1 0.8  Alkaline Phos 38 - 126 U/L - 64 76  AST 15 - 41 U/L - 18 22  ALT 0 - 44 U/L - 9 10    Assessment/Plan:   Principal Problem:   Small bowel obstruction (HCC) Active Problems:   HTN (hypertension)   ESRD on dialysis [facility])   Patient Summary: Timothy [name] is a 60 y.o. with a pertinent PMH of colon cancer s/p resection, ESRD on HD TThSat, malignant HTN, polysubstance use, who presented with abdominal pain with constipation and admitted for small bowel obstruction.    Small bowel obstruction  Patient presented with abdominal pain and constipation with imaging concerning for SBO. Overnight, patient unable to tolerate NG tube. He continues to have abdominal discomfort with distension. NG tube re-inserted this morning by surgery  with immediate bilious output.  - General surgery consulted, appreciate recommendations  - Repeating SBO protocol  - Maintain NPO status  - IVF 75cc/hr   Severe asymptomatic hypertension SBP 190's this AM.  Patient has a history of malignant hypertension. He has only received one dose of IV labetalol since yesterday.  - HD per nephrology as above - Continue IV labetalol q2h prn SBP>180 or DBP>110  ESRD on HD TThSat - HD per nephrology   Diet: NPO IVF: LR,75cc/hr VTE: Heparin Code: Full PT/OT recs: Pending  Prior to Admission Living Arrangement: Home Anticipated Discharge Location: Home Barriers to Discharge: Continued medical management  Dispo: Anticipated discharge  to Home in 3 days pending clinical improvement.   Harvie Heck, MD Internal Medicine Resident PGY-3 Pager# [phone]  Please contact the on call pager after 5 pm and on weekends at [phone].

## 2021-05-23 ENCOUNTER — Encounter (HOSPITAL_COMMUNITY)
Admission: EM | Disposition: A | Discharge status: Home / Self Care | Payer: Self-pay | Source: Home / Self Care | Attending: Student in an Organized Health Care Education/Training Program

## 2021-05-23 ENCOUNTER — Encounter (HOSPITAL_COMMUNITY): Payer: Self-pay | Admitting: Student in an Organized Health Care Education/Training Program

## 2021-05-23 ENCOUNTER — Inpatient Hospital Stay (HOSPITAL_COMMUNITY): Payer: Medicare HMO

## 2021-05-23 ENCOUNTER — Inpatient Hospital Stay (HOSPITAL_COMMUNITY): Payer: Medicare HMO | Admitting: Certified Registered"

## 2021-05-23 DIAGNOSIS — K56609 Unspecified intestinal obstruction, unspecified as to partial versus complete obstruction: Secondary | ICD-10-CM | POA: Diagnosis not present

## 2021-05-23 HISTORY — PX: LAPAROTOMY: SHX154

## 2021-05-23 LAB — TROPONIN I (HIGH SENSITIVITY)
Troponin I (High Sensitivity): 192 ng/L (ref <18)
Troponin I (High Sensitivity): 195 ng/L (ref <18)

## 2021-05-23 LAB — RENAL FUNCTION PANEL
Albumin: 3.2 g/dL — ABNORMAL LOW (ref 3.5–5.0)
Anion gap: 14 (ref 5–15)
BUN: 31 mg/dL — ABNORMAL HIGH (ref 6–20)
CO2: 30 mmol/L (ref 22–32)
Calcium: 8.4 mg/dL — ABNORMAL LOW (ref 8.9–10.3)
Chloride: 94 mmol/L — ABNORMAL LOW (ref 98–111)
Creatinine, Ser: 7.95 mg/dL — ABNORMAL HIGH (ref 0.61–1.24)
GFR, Estimated: 7 mL/min — ABNORMAL LOW (ref >60)
Glucose, Bld: 65 mg/dL — ABNORMAL LOW (ref 70–99)
Phosphorus: 6.2 mg/dL — ABNORMAL HIGH (ref 2.5–4.6)
Potassium: 4.4 mmol/L (ref 3.5–5.1)
Sodium: 138 mmol/L (ref 135–145)

## 2021-05-23 LAB — CBC
HCT: 32.2 % — ABNORMAL LOW (ref 39.0–52.0)
Hemoglobin: 10.4 g/dL — ABNORMAL LOW (ref 13.0–17.0)
MCH: 30.3 pg (ref 26.0–34.0)
MCHC: 32.3 g/dL (ref 30.0–36.0)
MCV: 93.9 fL (ref 80.0–100.0)
Platelets: 240 10*3/uL (ref 150–400)
RBC: 3.43 MIL/uL — ABNORMAL LOW (ref 4.22–5.81)
RDW: 14.5 % (ref 11.5–15.5)
WBC: 5.3 10*3/uL (ref 4.0–10.5)
nRBC: 0 % (ref 0.0–0.2)

## 2021-05-23 SURGERY — LAPAROTOMY, EXPLORATORY
Anesthesia: General | Site: Abdomen

## 2021-05-23 MED ORDER — ONDANSETRON HCL 4 MG/2ML IJ SOLN: 4.0000 mg | INTRAMUSCULAR | Status: DC | PRN

## 2021-05-23 MED ORDER — DEXMEDETOMIDINE (PRECEDEX) IN NS 20 MCG/5ML (4 MCG/ML) IV SYRINGE
PREFILLED_SYRINGE | INTRAVENOUS | Status: DC | PRN
  Administered 2021-05-23: 12 ug via INTRAVENOUS
  Administered 2021-05-23: 8 ug via INTRAVENOUS

## 2021-05-23 MED ORDER — MIDAZOLAM HCL 2 MG/2ML IJ SOLN
INTRAMUSCULAR | Status: AC
  Filled 2021-05-23: qty 2

## 2021-05-23 MED ORDER — LIDOCAINE-PRILOCAINE 2.5-2.5 % EX CREA
1.0000 "application " | TOPICAL_CREAM | CUTANEOUS | Status: DC | PRN
  Filled 2021-05-23: qty 5

## 2021-05-23 MED ORDER — ORAL CARE MOUTH RINSE: 15.0000 mL | Freq: Once | OROMUCOSAL | Status: AC

## 2021-05-23 MED ORDER — FENTANYL CITRATE (PF) 250 MCG/5ML IJ SOLN
INTRAMUSCULAR | Status: AC
  Filled 2021-05-23: qty 5

## 2021-05-23 MED ORDER — ONDANSETRON HCL 4 MG/2ML IJ SOLN
INTRAMUSCULAR | Status: DC | PRN
  Administered 2021-05-23: 4 mg via INTRAVENOUS

## 2021-05-23 MED ORDER — PENTAFLUOROPROP-TETRAFLUOROETH EX AERO
1.0000 "application " | INHALATION_SPRAY | CUTANEOUS | Status: DC | PRN
  Administered 2021-05-24: 1 via TOPICAL

## 2021-05-23 MED ORDER — SODIUM CHLORIDE 0.9 % IV SOLN: INTRAVENOUS | Status: DC

## 2021-05-23 MED ORDER — PHENYLEPHRINE HCL-NACL 20-0.9 MG/250ML-% IV SOLN
INTRAVENOUS | Status: DC | PRN
  Administered 2021-05-23: 20 ug/min via INTRAVENOUS

## 2021-05-23 MED ORDER — LIDOCAINE HCL (PF) 1 % IJ SOLN
5.0000 mL | INTRAMUSCULAR | Status: DC | PRN
  Filled 2021-05-23: qty 5

## 2021-05-23 MED ORDER — ALTEPLASE 2 MG IJ SOLR: 2.0000 mg | Freq: Once | INTRAMUSCULAR | Status: DC | PRN

## 2021-05-23 MED ORDER — PROPOFOL 10 MG/ML IV BOLUS
INTRAVENOUS | Status: DC | PRN
  Administered 2021-05-23: 140 mg via INTRAVENOUS

## 2021-05-23 MED ORDER — HYDROMORPHONE HCL 1 MG/ML IJ SOLN
1.5000 mg | INTRAMUSCULAR | Status: DC | PRN
  Administered 2021-05-23: 1.5 mg via INTRAVENOUS
  Filled 2021-05-23: qty 1.5

## 2021-05-23 MED ORDER — PENTAFLUOROPROP-TETRAFLUOROETH EX AERO: 1.0000 "application " | INHALATION_SPRAY | CUTANEOUS | Status: DC | PRN

## 2021-05-23 MED ORDER — HYDRALAZINE HCL 20 MG/ML IJ SOLN
INTRAMUSCULAR | Status: DC | PRN
  Administered 2021-05-23: 5 mg via INTRAVENOUS

## 2021-05-23 MED ORDER — HEPARIN SODIUM (PORCINE) 5000 UNIT/ML IJ SOLN
5000.0000 [IU] | Freq: Three times a day (TID) | INTRAMUSCULAR | Status: DC
  Administered 2021-05-24 – 2021-05-27 (×10): 5000 [IU] via SUBCUTANEOUS
  Filled 2021-05-23 (×11): qty 1

## 2021-05-23 MED ORDER — SUCCINYLCHOLINE CHLORIDE 200 MG/10ML IV SOSY
PREFILLED_SYRINGE | INTRAVENOUS | Status: DC | PRN
  Administered 2021-05-23: 140 mg via INTRAVENOUS

## 2021-05-23 MED ORDER — METHOCARBAMOL 1000 MG/10ML IJ SOLN
500.0000 mg | Freq: Four times a day (QID) | INTRAVENOUS | Status: DC | PRN
  Filled 2021-05-23 (×2): qty 5

## 2021-05-23 MED ORDER — SODIUM CHLORIDE 0.9 % IV SOLN
100.0000 mL | INTRAVENOUS | Status: DC | PRN
Start: 2021-05-23 — End: 2021-05-24

## 2021-05-23 MED ORDER — PHENYLEPHRINE HCL (PRESSORS) 10 MG/ML IV SOLN
INTRAVENOUS | Status: DC | PRN
  Administered 2021-05-23: 80 ug via INTRAVENOUS
  Administered 2021-05-23: 120 ug via INTRAVENOUS

## 2021-05-23 MED ORDER — CHLORHEXIDINE GLUCONATE 0.12 % MT SOLN: 15.0000 mL | Freq: Once | OROMUCOSAL | Status: AC

## 2021-05-23 MED ORDER — ROCURONIUM BROMIDE 10 MG/ML (PF) SYRINGE
PREFILLED_SYRINGE | INTRAVENOUS | Status: AC
  Filled 2021-05-23: qty 10

## 2021-05-23 MED ORDER — ONDANSETRON HCL 4 MG/2ML IJ SOLN
INTRAMUSCULAR | Status: AC
  Filled 2021-05-23: qty 2

## 2021-05-23 MED ORDER — LABETALOL HCL 5 MG/ML IV SOLN
INTRAVENOUS | Status: AC
  Filled 2021-05-23: qty 4

## 2021-05-23 MED ORDER — SODIUM CHLORIDE 0.9 % IV SOLN: 100.0000 mL | INTRAVENOUS | Status: DC | PRN

## 2021-05-23 MED ORDER — SUGAMMADEX SODIUM 200 MG/2ML IV SOLN
INTRAVENOUS | Status: DC | PRN
  Administered 2021-05-23: 350 mg via INTRAVENOUS

## 2021-05-23 MED ORDER — HEPARIN SODIUM (PORCINE) 1000 UNIT/ML DIALYSIS: 1000.0000 [IU] | INTRAMUSCULAR | Status: DC | PRN

## 2021-05-23 MED ORDER — LIDOCAINE 2% (20 MG/ML) 5 ML SYRINGE
INTRAMUSCULAR | Status: AC
  Filled 2021-05-23: qty 5

## 2021-05-23 MED ORDER — CEFAZOLIN SODIUM-DEXTROSE 2-4 GM/100ML-% IV SOLN
2.0000 g | INTRAVENOUS | Status: DC
  Filled 2021-05-23: qty 100

## 2021-05-23 MED ORDER — PROPOFOL 10 MG/ML IV BOLUS
INTRAVENOUS | Status: AC
  Filled 2021-05-23: qty 20

## 2021-05-23 MED ORDER — HYDROMORPHONE HCL 1 MG/ML IJ SOLN
INTRAMUSCULAR | Status: AC
  Filled 2021-05-23: qty 1

## 2021-05-23 MED ORDER — FENTANYL CITRATE (PF) 100 MCG/2ML IJ SOLN
INTRAMUSCULAR | Status: AC
  Filled 2021-05-23: qty 2

## 2021-05-23 MED ORDER — HYDROMORPHONE HCL 1 MG/ML IJ SOLN
0.5000 mg | INTRAMUSCULAR | Status: DC | PRN
  Administered 2021-05-23 – 2021-05-24 (×4): 2 mg via INTRAVENOUS
  Filled 2021-05-23 (×4): qty 2

## 2021-05-23 MED ORDER — SODIUM CHLORIDE 0.9 % IV SOLN: INTRAVENOUS | Status: DC | PRN

## 2021-05-23 MED ORDER — FENTANYL CITRATE (PF) 100 MCG/2ML IJ SOLN
INTRAMUSCULAR | Status: DC | PRN
  Administered 2021-05-23: 100 ug via INTRAVENOUS

## 2021-05-23 MED ORDER — FENTANYL CITRATE (PF) 100 MCG/2ML IJ SOLN
25.0000 ug | INTRAMUSCULAR | Status: DC | PRN
  Administered 2021-05-23 (×3): 50 ug via INTRAVENOUS

## 2021-05-23 MED ORDER — LIDOCAINE HCL (CARDIAC) PF 100 MG/5ML IV SOSY
PREFILLED_SYRINGE | INTRAVENOUS | Status: DC | PRN
  Administered 2021-05-23: 40 mg via INTRAVENOUS

## 2021-05-23 MED ORDER — ROCURONIUM BROMIDE 100 MG/10ML IV SOLN
INTRAVENOUS | Status: DC | PRN
  Administered 2021-05-23: 20 mg via INTRAVENOUS
  Administered 2021-05-23: 30 mg via INTRAVENOUS

## 2021-05-23 MED ORDER — CHLORHEXIDINE GLUCONATE 0.12 % MT SOLN
OROMUCOSAL | Status: AC
  Administered 2021-05-23: 15 mL via OROMUCOSAL
  Filled 2021-05-23: qty 15

## 2021-05-23 MED ORDER — EPHEDRINE SULFATE 50 MG/ML IJ SOLN
INTRAMUSCULAR | Status: DC | PRN
  Administered 2021-05-23: 10 mg via INTRAVENOUS

## 2021-05-23 MED ORDER — LACTATED RINGERS IV SOLN: INTRAVENOUS | Status: AC

## 2021-05-23 MED ORDER — HYDROMORPHONE HCL 1 MG/ML IJ SOLN
0.2500 mg | INTRAMUSCULAR | Status: DC | PRN
  Administered 2021-05-23 (×4): 0.5 mg via INTRAVENOUS

## 2021-05-23 MED ORDER — MIDAZOLAM HCL 5 MG/5ML IJ SOLN
INTRAMUSCULAR | Status: DC | PRN
  Administered 2021-05-23: 2 mg via INTRAVENOUS

## 2021-05-23 MED ORDER — 0.9 % SODIUM CHLORIDE (POUR BTL) OPTIME
TOPICAL | Status: DC | PRN
  Administered 2021-05-23: 2000 mL

## 2021-05-23 MED ORDER — LABETALOL HCL 5 MG/ML IV SOLN
5.0000 mg | INTRAVENOUS | Status: AC | PRN
  Administered 2021-05-23 (×2): 10 mg via INTRAVENOUS

## 2021-05-23 MED ORDER — ACETAMINOPHEN 10 MG/ML IV SOLN
1000.0000 mg | Freq: Four times a day (QID) | INTRAVENOUS | Status: AC
  Administered 2021-05-23 – 2021-05-24 (×4): 1000 mg via INTRAVENOUS
  Filled 2021-05-23 (×4): qty 100

## 2021-05-23 MED ORDER — DEXAMETHASONE SODIUM PHOSPHATE 4 MG/ML IJ SOLN
INTRAMUSCULAR | Status: DC | PRN
  Administered 2021-05-23: 5 mg via INTRAVENOUS

## 2021-05-23 SURGICAL SUPPLY — 43 items
BAG COUNTER SPONGE SURGICOUNT (BAG) ×2 IMPLANT
BLADE CLIPPER SURG (BLADE) ×2 IMPLANT
CANISTER SUCT 3000ML PPV (MISCELLANEOUS) ×2 IMPLANT
CHLORAPREP W/TINT 26 (MISCELLANEOUS) ×2 IMPLANT
CLIP TI MEDIUM 6 (CLIP) ×2 IMPLANT
COVER SURGICAL LIGHT HANDLE (MISCELLANEOUS) ×2 IMPLANT
DRAPE LAPAROSCOPIC ABDOMINAL (DRAPES) ×2 IMPLANT
DRAPE WARM FLUID 44X44 (DRAPES) ×2 IMPLANT
DRSG OPSITE POSTOP 4X10 (GAUZE/BANDAGES/DRESSINGS) IMPLANT
DRSG OPSITE POSTOP 4X12 (GAUZE/BANDAGES/DRESSINGS) ×2 IMPLANT
DRSG OPSITE POSTOP 4X8 (GAUZE/BANDAGES/DRESSINGS) IMPLANT
ELECT BLADE 6.5 EXT (BLADE) IMPLANT
ELECT CAUTERY BLADE 6.4 (BLADE) ×2 IMPLANT
ELECT REM PT RETURN 9FT ADLT (ELECTROSURGICAL) ×2
ELECTRODE REM PT RTRN 9FT ADLT (ELECTROSURGICAL) ×1 IMPLANT
GLOVE SURG ENC MOIS LTX SZ7 (GLOVE) ×2 IMPLANT
GLOVE SURG UNDER POLY LF SZ7.5 (GLOVE) ×2 IMPLANT
GOWN STRL REUS W/ TWL LRG LVL3 (GOWN DISPOSABLE) ×2 IMPLANT
GOWN STRL REUS W/TWL LRG LVL3 (GOWN DISPOSABLE) ×2
HANDLE SUCTION POOLE (INSTRUMENTS) ×1 IMPLANT
KIT BASIN OR (CUSTOM PROCEDURE TRAY) ×2 IMPLANT
KIT TURNOVER KIT B (KITS) ×2 IMPLANT
LIGASURE IMPACT 36 18CM CVD LR (INSTRUMENTS) IMPLANT
NS IRRIG 1000ML POUR BTL (IV SOLUTION) ×4 IMPLANT
PACK GENERAL/GYN (CUSTOM PROCEDURE TRAY) ×2 IMPLANT
PAD ARMBOARD 7.5X6 YLW CONV (MISCELLANEOUS) ×2 IMPLANT
PENCIL SMOKE EVACUATOR (MISCELLANEOUS) ×2 IMPLANT
SPECIMEN JAR LARGE (MISCELLANEOUS) IMPLANT
SPONGE T-LAP 18X18 ~~LOC~~+RFID (SPONGE) ×4 IMPLANT
STAPLER VISISTAT 35W (STAPLE) ×2 IMPLANT
SUCTION POOLE HANDLE (INSTRUMENTS) ×2
SUT PDS AB 1 TP1 96 (SUTURE) ×4 IMPLANT
SUT SILK 2 0 (SUTURE) ×1
SUT SILK 2 0 SH CR/8 (SUTURE) ×2 IMPLANT
SUT SILK 2-0 18XBRD TIE 12 (SUTURE) ×1 IMPLANT
SUT SILK 3 0 (SUTURE) ×2
SUT SILK 3 0 SH CR/8 (SUTURE) ×2 IMPLANT
SUT SILK 3-0 18XBRD TIE 12 (SUTURE) ×2 IMPLANT
SUT VIC AB 3-0 SH 27 (SUTURE)
SUT VIC AB 3-0 SH 27X BRD (SUTURE) IMPLANT
TOWEL GREEN STERILE (TOWEL DISPOSABLE) ×2 IMPLANT
TRAY FOLEY MTR SLVR 16FR STAT (SET/KITS/TRAYS/PACK) IMPLANT
YANKAUER SUCT BULB TIP NO VENT (SUCTIONS) ×2 IMPLANT

## 2021-05-23 NOTE — Progress Notes (Addendum)
[facility] Progress Note   Subjective: Seen in room. Says he is going to OR tomorrow but [facility] note says today. HD tomorrow on schedule.     Objective Vitals:   05/22/21 2353 05/23/21 0520 05/23/21 0700 05/23/21 0736  BP: (!) 191/103 (!) 194/101 (!) 155/110 (!) 151/94  Pulse: 75 74 86 83  Resp: 16 17  16   Temp: 97.9 F (36.6 C) 97.8 F (36.6 C)  98.6 F (37 C)  TempSrc: Axillary Axillary  Oral  SpO2: 97% 95%  96%  Weight:      Height:       Physical Exam General: Thin chronically ill appearing male, looks older than stated age. Heart: S1,S2, RRR No M/R/G Lungs: CTAB  Abdomen: NG with bilious drainage to LWS. No BS. Guarding lower abdomen.  Extremities: No LE edema Dialysis Access: L AVF +T/B   Additional Objective Labs: Basic Metabolic Panel: Recent Labs  Lab 05/21/21 0811 05/22/21 0303 05/22/21 2352  NA 136 136 138  K 5.2* 4.0 4.4  CL 90* 97* 94*  CO2 32 29 30  GLUCOSE 90 75 65*  BUN 42* 18 31*  CREATININE 9.50* 5.33* 7.95*  CALCIUM 8.6* 8.0* 8.4*  PHOS  --  3.7 6.2*   Liver Function Tests: Recent Labs  Lab 05/19/21 2049 05/21/21 0811 05/22/21 0303 05/22/21 2352  AST 22 18  --   --   ALT 10 9  --   --   ALKPHOS 76 64  --   --   BILITOT 0.8 1.1  --   --   PROT 8.0 6.7  --   --   ALBUMIN 4.0 3.3* 3.2* 3.2*   Recent Labs  Lab 05/19/21 2049  LIPASE 40   CBC: Recent Labs  Lab 05/19/21 2049 05/21/21 0811 05/22/21 0303 05/22/21 2352  WBC 4.9 4.7 4.6 5.3  NEUTROABS 2.9  --   --   --   HGB 12.5* 11.1* 10.7* 10.4*  HCT 38.3* 34.0* 33.0* 32.2*  MCV 91.6 92.9 94.0 93.9  PLT 279 252 227 240   Blood Culture    Component Value Date/Time   SDES BLOOD RIGHT HAND 10/26/2016 1203   SPECREQUEST BOTTLES DRAWN AEROBIC AND ANAEROBIC  5CC 10/26/2016 1203   CULT NO GROWTH 5 DAYS 10/26/2016 1203   REPTSTATUS 10/31/2016 FINAL 10/26/2016 1203    Cardiac Enzymes: No results for input(s): CKTOTAL, CKMB, CKMBINDEX, TROPONINI in the last 168  hours. CBG: No results for input(s): GLUCAP in the last 168 hours. Iron Studies: No results for input(s): IRON, TIBC, TRANSFERRIN, FERRITIN in the last 72 hours. @lablastinr3 @ Studies/Results: DG Abd Portable 1V  Result Date: 05/23/2021 CLINICAL DATA:  Small bowel obstruction. Acute generalized abdominal pain. EXAM: PORTABLE ABDOMEN - 1 VIEW COMPARISON:  May 22, 2021. FINDINGS: Nasogastric tube tip is seen in proximal stomach. Dilated small bowel loops are noted concerning for distal small bowel obstruction. Contrast is seen in the distal small bowel. No definite colonic dilatation is noted. No definite contrast is noted in the colon. IMPRESSION: Dilated small bowel loops are noted concerning for distal small bowel obstruction. Electronically Signed   By: Marijo Conception M.D.   On: 05/23/2021 09:35   DG Abd Portable 1V-Small Bowel Obstruction Protocol-initial, 8 hr delay  Result Date: 05/22/2021 CLINICAL DATA:  Bowel obstruction EXAM: PORTABLE ABDOMEN - 1 VIEW COMPARISON:  05/22/2021, 05/21/2021, CT 05/20/2021 FINDINGS: Esophageal tube tip overlies gastric fundal region. Contrast within the stomach. Contrast present within dilated small bowel in  the central and right abdomen but no definitive contrast in the colon. Bowel distension similar compared to prior. IMPRESSION: Enteral contrast present within dilated small bowel consistent with small bowel obstruction. No convincing contrast in the colon. Electronically Signed   By: Donavan Foil M.D.   On: 05/22/2021 20:56   DG Abd Portable 1V  Result Date: 05/22/2021 CLINICAL DATA:  Nasogastric tube placement EXAM: PORTABLE ABDOMEN - 1 VIEW COMPARISON:  Yesterday FINDINGS: The enteric tube tip and side-port is still position at the stomach. Enteric contrast is diluted throughout dilated small bowel loops. Atelectasis at the bases. Cardiomegaly. IMPRESSION: 1. Enteric tube is in good position over the stomach. 2. High-grade small bowel obstruction persists  with interval dilution of contrast within dilated small bowel loops. Electronically Signed   By: Monte Fantasia M.D.   On: 05/22/2021 11:16   DG Abd Portable 1V  Result Date: 05/21/2021 CLINICAL DATA:  Nasogastric tube placement EXAM: PORTABLE ABDOMEN - 1 VIEW COMPARISON:  None. FINDINGS: Nasogastric tube tip is seen just beyond the gastroesophageal junction within the gastric cardia and the proximal side hole is seen within the distal esophagus. Multiple gas-filled dilated loops of bowel are seen throughout the abdomen in keeping with a distal partial small bowel obstruction. Contrast from prior CT examination resides in several distal loops of small bowel. Pelvis excluded from view. No organomegaly. Cardiac size is mildly enlarged. IMPRESSION: Nasogastric tube tip just beyond the gastroesophageal junction. Advancement of the catheter by 10-15 cm would better position the catheter within the gastric lumen. Electronically Signed   By: Fidela Salisbury MD   On: 05/21/2021 17:06   DG Abd Portable 1V  Result Date: 05/21/2021 CLINICAL DATA:  Small-bowel obstruction followup. LOWER abdominal pain and abdominal distention. EXAM: PORTABLE ABDOMEN - 1 VIEW COMPARISON:  05/20/2021 FINDINGS: Nasogastric tube has been removed or withdrawn into the proximal esophagus. Small amount of dilute contrast is identified within small bowel loops in the LEFT mid abdomen. There is significant persistent dilatation of numerous small bowel loops throughout the abdomen and pelvis. No definitive large bowel contrast. IMPRESSION: 1. Interval removal or withdrawal of nasogastric tube. 2. Dilute contrast throughout very dilated loops of small bowel, similar in appearance to prior study. No definitive large bowel contrast. Findings are consistent with persistent small-bowel obstruction. Electronically Signed   By: Nolon Nations M.D.   On: 05/21/2021 13:25   Medications:   ceFAZolin (ANCEF) IV     lactated ringers 75 mL/hr at 05/23/21  0800    Chlorhexidine Gluconate Cloth  6 each Topical Q0600   [START ON 05/24/2021] doxercalciferol  2 mcg Intravenous Q T,Th,Sa-HD   heparin  5,000 Units Subcutaneous Q8H   HD orders: [place] T,Th,S  3:45 hrs 450/Autoflow 1.5 66 kg 2.0K/2.0 Ca UFP 4 AVF -No heparin -Venofer 100 mg IV X 10 doses (3/10 doses given) -Mircera 30 mcg IV q 2 weeks (last dose 05/12/2021)   Assessment/Plan: 1. SBO-persistent SBO failing conservative tx. To OR today per CCS for exploratory lap.  2. ESRD -T,Th,S next HD 05/24/2021 3. Anemia - HGB 10.4. Follow HGB. Hold Fe load.  4. Secondary hyperparathyroidism - Binders on hold D/T SBO. Continue VDRA 5. HTN/volume -Variable control of HD. No volume excess by exam.Minimal UF 05/21/21. Currently sl above OP EDW. UF as tolerated.  6. Nutrition - NPO 7. Lesion R Kidney-possible RCC. Recommending OP FU with MRI.   Rita H. Brown NP-C 05/23/2021, 10:25 AM  [facility] [phone]  Pt seen, examined  and agree w A/P as above.  Kelly Splinter  MD 05/23/2021, 3:49 PM

## 2021-05-23 NOTE — Transfer of Care (Signed)
Immediate Anesthesia Transfer of Care Note  Patient: Timothy [name]  Procedure(s) Performed: EXPLORATORY LAPAROTOMY LYSIS ADHESIONS (Abdomen)  Patient Location: PACU  Anesthesia Type:General  Level of Consciousness: awake and pateint uncooperative  Airway & Oxygen Therapy: Patient Spontanous Breathing and Patient connected to face mask oxygen  Post-op Assessment: Report given to RN and Post -op Vital signs reviewed and stable  Post vital signs: Reviewed and stable  Last Vitals:  Vitals Value Taken Time  BP 177/98 05/23/21 1654  Temp    Pulse 71 05/23/21 1700  Resp 13 05/23/21 1700  SpO2 89 % 05/23/21 1700  Vitals shown include unvalidated device data.  Last Pain:  Vitals:   05/23/21 1426  TempSrc: Oral  PainSc:       Patients Stated Pain Goal: 4 (80/22/33 6122)  Complications: No notable events documented.

## 2021-05-23 NOTE — Anesthesia Preprocedure Evaluation (Addendum)
Anesthesia Evaluation  Patient identified by MRN, date of birth, ID band Patient awake    Reviewed: Allergy & Precautions, NPO status , Patient's Chart, lab work & pertinent test results  Airway Mallampati: II  TM Distance: >3 FB Neck ROM: Full    Dental  (+) Dental Advisory Given, Missing,    Pulmonary neg pulmonary ROS, former smoker,    Pulmonary exam normal breath sounds clear to auscultation       Cardiovascular hypertension, Pt. on medications +CHF, + Orthopnea and + DOE  Normal cardiovascular exam Rhythm:Regular Rate:Normal  TTE 2017 - Left ventricle: The cavity size was normal. Wall thickness was  increased in a pattern of moderate LVH. Systolic function was  normal. The estimated ejection fraction was in the range of 60%  to 65%. Doppler parameters are consistent with both elevated  ventricular end-diastolic filling pressure and elevated left  atrial filling pressure.  - Mitral valve: There was mild regurgitation.  - Left atrium: The atrium was moderately dilated.  - Atrial septum: A patent foramen ovale cannot be excluded.   Neuro/Psych negative neurological ROS  negative psych ROS   GI/Hepatic negative GI ROS, (+)     substance abuse  marijuana use and IV drug use,   Endo/Other  negative endocrine ROS  Renal/GU ESRF and DialysisRenal disease (Cr 7.95, K 4.4, dialysis TThSat, had dialysis on Sat)  negative genitourinary   Musculoskeletal negative musculoskeletal ROS (+)   Abdominal   Peds  Hematology  (+) Blood dyscrasia (Hgb 10.4), anemia ,   Anesthesia Other Findings   Reproductive/Obstetrics                           Anesthesia Physical Anesthesia Plan  ASA: 3  Anesthesia Plan: General   Post-op Pain Management:    Induction: Intravenous and Rapid sequence  PONV Risk Score and Plan: 2 and Midazolam, Dexamethasone and Ondansetron  Airway Management  Planned: Oral ETT  Additional Equipment:   Intra-op Plan:   Post-operative Plan: Extubation in OR  Informed Consent: I have reviewed the patients History and Physical, chart, labs and discussed the procedure including the risks, benefits and alternatives for the proposed anesthesia with the patient or authorized representative who has indicated his/her understanding and acceptance.     Dental advisory given  Plan Discussed with: CRNA  Anesthesia Plan Comments:         Anesthesia Quick Evaluation

## 2021-05-23 NOTE — Op Note (Signed)
Preoperative diagnosis: Small bowel obstruction Postoperative diagnosis: Adhesive small bowel obstruction Procedure: Exploratory laparotomy with lysis of adhesions Surgeon: Dr. Serita Grammes Assistant: Saverio Danker, PA-C Anesthesia: General with Estimated blood loss: 50 cc Specimens: None Drains: None Complications: None Special count was correct completion Decision recovery stable condition  Indications: This is a 60 year old male who in the remote past at another hospital appears to have had a laparoscopic-assisted right colectomy.  He is admitted with a small bowel obstruction which is not resolved with conservative therapy and is failed a small bowel protocol.  I discussed with him this morning going to the operating room.  Procedure: After informed consent was obtained the patient was taken to the operating room.  He was given antibiotics.  SCDs were in place.  He was placed under general anesthesia without complication.  He was prepped and draped in the standard sterile surgical fashion.  A surgical timeout was then performed.  I made a midline incision that included his old upper midline.  I entered into the abdomen without difficulty.  His anastomosis was adherent to his abdominal wall and I took this down with Metzenbaums.  His small bowel was very dilated.  His NG tube was in the correct position.  I tracked his small bowel down and there were a lot of adhesions and it appeared he had an internal hernia with small bowel herniated through the mesenteric defect from his right colon.  This was not where the obstruction was.  The obstruction was clearly coming from the terminal ileum right next to his anastomosis where it appeared that there was a piece of omentum wrapped around this.  I released this and the bowel was patent.  There were 2 areas that were clear had clearly been constricted during this.  Over some time as we explored the abdomen and release some additional scar tissue these  both pinked up and were viable.  They were both patent as well.  I did not think that the internal hernia was any causes of his obstruction and if I took all of that now and I was concerned that I would make an enterotomy and causing more difficulty.  I felt that I would relieve the obstruction at this point and decided to stop.  I did place a 3-0 silk suture and a small serosal tear in this piece of terminal ileum.  I then placed the bowel back in the abdomen.  We closed this with #1 looped PDS.  Staples used to close the skin a dressing was placed.  He tolerated this well was extubated and transferred to recovery stable.

## 2021-05-23 NOTE — Anesthesia Procedure Notes (Signed)
Procedure Name: Intubation Date/Time: 05/23/2021 3:38 PM Performed by: Oletta Lamas, CRNA Pre-anesthesia Checklist: Patient identified, Emergency Drugs available, Suction available and Patient being monitored Patient Re-evaluated:Patient Re-evaluated prior to induction Oxygen Delivery Method: Circle System Utilized Preoxygenation: Pre-oxygenation with 100% oxygen Induction Type: IV induction Ventilation: Mask ventilation without difficulty Laryngoscope Size: Mac, 4, [name] and 2 Grade View: Grade II Tube type: Oral Number of attempts: 1 Airway Equipment and Method: Stylet Placement Confirmation: ETT inserted through vocal cords under direct vision, positive ETCO2 and breath sounds checked- equal and bilateral Secured at: 23 cm Tube secured with: Tape Dental Injury: Teeth and Oropharynx as per pre-operative assessment  Comments: 1st attempt SRNA grade 2 view. Didn't attempt to pass tube.  Second attempt MDA

## 2021-05-23 NOTE — Progress Notes (Signed)
Subjective: No change.  Still with abdominal pain and distention, no flatus or BM.  ROS: See above, otherwise other systems negative  Objective: Vital signs in last 24 hours: Temp:  [97.6 F (36.4 C)-98.6 F (37 C)] 98.6 F (37 C) (08/08 0736) Pulse Rate:  [71-86] 83 (08/08 0736) Resp:  [16-19] 16 (08/08 0736) BP: (151-203)/(94-120) 151/94 (08/08 0736) SpO2:  [95 %-97 %] 96 % (08/08 0736) Last BM Date: 05/20/21  Intake/Output from previous day: 08/07 0701 - 08/08 0700 In: -  Out: 1500 [Emesis/NG output:1500] Intake/Output this shift: Total I/O In: -  Out: 500 [Emesis/NG output:500]  PE: Abd: still with some mild distention and some tympany, hypoactive BS, mild diffuse tenderness, NGT with bilious output, 2L since placement yesterday.  Lab Results:  Recent Labs    05/22/21 0303 05/22/21 2352  WBC 4.6 5.3  HGB 10.7* 10.4*  HCT 33.0* 32.2*  PLT 227 240   BMET Recent Labs    05/22/21 0303 05/22/21 2352  NA 136 138  K 4.0 4.4  CL 97* 94*  CO2 29 30  GLUCOSE 75 65*  BUN 18 31*  CREATININE 5.33* 7.95*  CALCIUM 8.0* 8.4*   PT/INR No results for input(s): LABPROT, INR in the last 72 hours. CMP     Component Value Date/Time   NA 138 05/22/2021 2352   K 4.4 05/22/2021 2352   CL 94 (L) 05/22/2021 2352   CO2 30 05/22/2021 2352   GLUCOSE 65 (L) 05/22/2021 2352   BUN 31 (H) 05/22/2021 2352   CREATININE 7.95 (H) 05/22/2021 2352   CREATININE 1.59 (H) 07/29/2015 0943   CALCIUM 8.4 (L) 05/22/2021 2352   CALCIUM 8.4 (L) 09/14/2016 1615   PROT 6.7 05/21/2021 0811   ALBUMIN 3.2 (L) 05/22/2021 2352   AST 18 05/21/2021 0811   ALT 9 05/21/2021 0811   ALKPHOS 64 05/21/2021 0811   BILITOT 1.1 05/21/2021 0811   GFRNONAA 7 (L) 05/22/2021 2352   GFRNONAA 48 (L) 07/29/2015 0943   GFRAA 7 (L) 05/18/2019 2220   GFRAA 56 (L) 07/29/2015 0943   Lipase     Component Value Date/Time   LIPASE 40 05/19/2021 2049       Studies/Results: DG Abd Portable  1V  Result Date: 05/23/2021 CLINICAL DATA:  Small bowel obstruction. Acute generalized abdominal pain. EXAM: PORTABLE ABDOMEN - 1 VIEW COMPARISON:  May 22, 2021. FINDINGS: Nasogastric tube tip is seen in proximal stomach. Dilated small bowel loops are noted concerning for distal small bowel obstruction. Contrast is seen in the distal small bowel. No definite colonic dilatation is noted. No definite contrast is noted in the colon. IMPRESSION: Dilated small bowel loops are noted concerning for distal small bowel obstruction. Electronically Signed   By: Marijo Conception M.D.   On: 05/23/2021 09:35   DG Abd Portable 1V-Small Bowel Obstruction Protocol-initial, 8 hr delay  Result Date: 05/22/2021 CLINICAL DATA:  Bowel obstruction EXAM: PORTABLE ABDOMEN - 1 VIEW COMPARISON:  05/22/2021, 05/21/2021, CT 05/20/2021 FINDINGS: Esophageal tube tip overlies gastric fundal region. Contrast within the stomach. Contrast present within dilated small bowel in the central and right abdomen but no definitive contrast in the colon. Bowel distension similar compared to prior. IMPRESSION: Enteral contrast present within dilated small bowel consistent with small bowel obstruction. No convincing contrast in the colon. Electronically Signed   By: Donavan Foil M.D.   On: 05/22/2021 20:56   DG Abd Portable 1V  Result Date: 05/22/2021 CLINICAL DATA:  Nasogastric tube placement EXAM: PORTABLE ABDOMEN - 1 VIEW COMPARISON:  Yesterday FINDINGS: The enteric tube tip and side-port is still position at the stomach. Enteric contrast is diluted throughout dilated small bowel loops. Atelectasis at the bases. Cardiomegaly. IMPRESSION: 1. Enteric tube is in good position over the stomach. 2. High-grade small bowel obstruction persists with interval dilution of contrast within dilated small bowel loops. Electronically Signed   By: Monte Fantasia M.D.   On: 05/22/2021 11:16   DG Abd Portable 1V  Result Date: 05/21/2021 CLINICAL DATA:   Nasogastric tube placement EXAM: PORTABLE ABDOMEN - 1 VIEW COMPARISON:  None. FINDINGS: Nasogastric tube tip is seen just beyond the gastroesophageal junction within the gastric cardia and the proximal side hole is seen within the distal esophagus. Multiple gas-filled dilated loops of bowel are seen throughout the abdomen in keeping with a distal partial small bowel obstruction. Contrast from prior CT examination resides in several distal loops of small bowel. Pelvis excluded from view. No organomegaly. Cardiac size is mildly enlarged. IMPRESSION: Nasogastric tube tip just beyond the gastroesophageal junction. Advancement of the catheter by 10-15 cm would better position the catheter within the gastric lumen. Electronically Signed   By: Fidela Salisbury MD   On: 05/21/2021 17:06   DG Abd Portable 1V  Result Date: 05/21/2021 CLINICAL DATA:  Small-bowel obstruction followup. LOWER abdominal pain and abdominal distention. EXAM: PORTABLE ABDOMEN - 1 VIEW COMPARISON:  05/20/2021 FINDINGS: Nasogastric tube has been removed or withdrawn into the proximal esophagus. Small amount of dilute contrast is identified within small bowel loops in the LEFT mid abdomen. There is significant persistent dilatation of numerous small bowel loops throughout the abdomen and pelvis. No definitive large bowel contrast. IMPRESSION: 1. Interval removal or withdrawal of nasogastric tube. 2. Dilute contrast throughout very dilated loops of small bowel, similar in appearance to prior study. No definitive large bowel contrast. Findings are consistent with persistent small-bowel obstruction. Electronically Signed   By: Nolon Nations M.D.   On: 05/21/2021 13:25    Anti-infectives: Anti-infectives (From admission, onward)    None        Assessment/Plan Small bowel obstruction - NGT in place still -patient still with persistent SBO on films.  Will plan for OR today for ex lap with hopefully just LOA vs SBR. -discussed procedure with  patient who is agreeable to proceed.     FEN: NPO, NGT ID: none VTE: okay for chemical prophylaxis from our standpoint   ESRD - dialysis HTN   LOS: 2 days    Henreitta Cea , [facility] Surgery 05/23/2021, 9:46 AM Please see Amion for pager number during day hours 7:00am-4:30pm or 7:00am -11:30am on weekends

## 2021-05-23 NOTE — Progress Notes (Signed)
   Subjective:  No acute overnight events. Patient was seen at bedside today. Pt reports feeling in pain post surgery. Pt complains of abdominal pain due to the surgery. Pt denies chest pain and abdominal distention. He reports flatus, but no BM today. No other complains or concerns at this time.   Objective:  Vital signs in last 24 hours: Vitals:   05/23/21 0520 05/23/21 0700 05/23/21 0736 05/23/21 1215  BP: (!) 194/101 (!) 155/110 (!) 151/94 (!) 145/81  Pulse: 74 86 83 73  Resp: 17  16 17   Temp: 97.8 F (36.6 C)  98.6 F (37 C) (!) 97.5 F (36.4 C)  TempSrc: Axillary  Oral Oral  SpO2: 95%  96% 99%  Weight:      Height:       Constitutional: alert, well-appearing, in no acute distress Cardiovascular: regular rate and rhythm, no m/r/g Pulmonary/Chest: normal work of breathing on room air, lungs clear to auscultation bilaterally Abdominal: soft, tenderness to palpation, non-distended MSK: normal bulk and tone Neurological: alert & oriented x 3  Skin: warm and dry  Assessment/Plan:  Principal Problem:   Small bowel obstruction (HCC) Active Problems:   HTN (hypertension)   ESRD on dialysis (HCC)  Timothy [name] is a 60 y.o. with a pertinent PMH of colon cancer s/p resection, ESRD on HD TThSat, malignant HTN, polysubstance use, who presented with abdominal pain with constipation and admitted for small bowel obstruction.   Small bowel obstruction Patient complaining of abdominal pain, distention and constipation, with SBO on imaging and ongoing ilius. Failed conservative management and SBO protocol. S/p ex lap with lysis of adhesions. Continues to have NG tube output. Reports flatus, denies having a BM and abdominal distention, and he is afebrile.  - Maintain NPO status for bowel rest  - Maintain NG tube  - IVF 50cc/hr - On IV Tylenol/dilaudid 1 mg q2h prn, per surgery team    Severe asymptomatic hypertension SBP 160-200 this AM.  Patient has a history of malignant  hypertension. He has received 4 doses of IV labetalol today thus far.  - HD per nephrology as above, had HD today.  - Continue IV labetalol q2h prn SBP>180 or DBP>110   ESRD on HD TThSat - HD per nephrology   Signature: Lajean Manes, MD  Internal Medicine Resident, PGY-1 [facility]  Pager: [phone] 1:20 PM, 05/23/2021  After 5pm on weekdays and 1pm on weekends: On Call pager [phone]

## 2021-05-23 NOTE — Hospital Course (Addendum)
Small bowel obstruction Patient presented with abdominal pain and constipation with imaging concerning for SBO. First night, patient unable to tolerate NG tube. He continues to have abdominal discomfort with distension. NG tube re-inserted this morning by surgery with immediate bilious output. Patient c/o on ongoing abdominal pain, distention and constipation, with persistent SBO on imaging. Tolerated NG tube overnight second night, with continued NG output, net 2000 mL. Denies flatus or BM. He is afebrile. General surgery plans for ex lap today for lysis of adhesions vs small bowel resection. Dilaudid 1.5 mg q4h prn for ongoing abdominal pain.   Ex lap with lysis of adhesions. Lots of adhesions and an internal hernia with small bowel herniated through the mesenteric defect from his right colon seen. The obstruction was clearly coming from the terminal ileum right next to his anastomosis where it appeared that there was a piece of omentum wrapped around this. This was released. General surgery started IV Tylenol/dilaudid 1 mg q2h prn. He had flatus but no BM. Conservative management was continued with NG tube, NPO and pain management. He maintained good NG tube output. Day 2 after surgery, he tolerated NG tube clamping. The next day, he reports significant improvement in his symptoms, reduced abdominal pain, tolerating foods/drinks, and had a BM.   Severe asymptomatic hypertension Patient had left sided throbbing CP that resolved after labetalol with BP >200, trops 180, 197, and 195 on day 2. EKG showed nonspecific T wave changes. Consistent with asymptomatic severe hypertension. Continued to receive IV labetalol q2h prn SBP>180 or DBP>110, then changed to IV hydralazine q4h prn SBP>180 give 10 mg, or SBP>210 give 20 mg per nephrology. After NG tube clamping trial, pt tolerates PO well, and was started on home diltiazem. BP ranged from SBP 160-200 during hospitalization.    ESRD on HD TThSat -HD per  nephrologyYou

## 2021-05-24 ENCOUNTER — Encounter (HOSPITAL_COMMUNITY): Payer: Self-pay | Admitting: General Surgery

## 2021-05-24 LAB — HEPATITIS B SURFACE ANTIGEN: Hepatitis B Surface Ag: NONREACTIVE

## 2021-05-24 LAB — CBC
HCT: 32.3 % — ABNORMAL LOW (ref 39.0–52.0)
HCT: 30.6 % — ABNORMAL LOW (ref 39.0–52.0)
Hemoglobin: 10.2 g/dL — ABNORMAL LOW (ref 13.0–17.0)
Hemoglobin: 9.8 g/dL — ABNORMAL LOW (ref 13.0–17.0)
MCH: 30.1 pg (ref 26.0–34.0)
MCH: 29.7 pg (ref 26.0–34.0)
MCHC: 31.6 g/dL (ref 30.0–36.0)
MCHC: 32 g/dL (ref 30.0–36.0)
MCV: 95.3 fL (ref 80.0–100.0)
MCV: 92.7 fL (ref 80.0–100.0)
Platelets: 246 10*3/uL (ref 150–400)
Platelets: 227 10*3/uL (ref 150–400)
RBC: 3.39 MIL/uL — ABNORMAL LOW (ref 4.22–5.81)
RBC: 3.3 MIL/uL — ABNORMAL LOW (ref 4.22–5.81)
RDW: 14.4 % (ref 11.5–15.5)
RDW: 14.2 % (ref 11.5–15.5)
WBC: 9.6 10*3/uL (ref 4.0–10.5)
WBC: 6.6 10*3/uL (ref 4.0–10.5)
nRBC: 0 % (ref 0.0–0.2)
nRBC: 0 % (ref 0.0–0.2)

## 2021-05-24 LAB — RENAL FUNCTION PANEL
Albumin: 3.2 g/dL — ABNORMAL LOW (ref 3.5–5.0)
Anion gap: 16 — ABNORMAL HIGH (ref 5–15)
BUN: 58 mg/dL — ABNORMAL HIGH (ref 6–20)
CO2: 28 mmol/L (ref 22–32)
Calcium: 8.1 mg/dL — ABNORMAL LOW (ref 8.9–10.3)
Chloride: 91 mmol/L — ABNORMAL LOW (ref 98–111)
Creatinine, Ser: 9.61 mg/dL — ABNORMAL HIGH (ref 0.61–1.24)
GFR, Estimated: 6 mL/min — ABNORMAL LOW (ref >60)
Glucose, Bld: 112 mg/dL — ABNORMAL HIGH (ref 70–99)
Phosphorus: 9.5 mg/dL — ABNORMAL HIGH (ref 2.5–4.6)
Potassium: 4.8 mmol/L (ref 3.5–5.1)
Sodium: 135 mmol/L (ref 135–145)

## 2021-05-24 MED ORDER — HYDROMORPHONE HCL 1 MG/ML IJ SOLN
INTRAMUSCULAR | Status: AC
  Administered 2021-05-24: 1 mg via INTRAVENOUS
  Filled 2021-05-24: qty 1

## 2021-05-24 MED ORDER — LACTATED RINGERS IV SOLN: INTRAVENOUS | Status: AC

## 2021-05-24 MED ORDER — ACETAMINOPHEN 10 MG/ML IV SOLN
1000.0000 mg | Freq: Four times a day (QID) | INTRAVENOUS | Status: AC
  Administered 2021-05-24 – 2021-05-25 (×4): 1000 mg via INTRAVENOUS
  Filled 2021-05-24 (×5): qty 100

## 2021-05-24 MED ORDER — METHOCARBAMOL 1000 MG/10ML IJ SOLN
500.0000 mg | Freq: Three times a day (TID) | INTRAVENOUS | Status: DC
  Administered 2021-05-24 – 2021-05-26 (×7): 500 mg via INTRAVENOUS
  Filled 2021-05-24 (×3): qty 500
  Filled 2021-05-24 (×3): qty 5
  Filled 2021-05-24: qty 500
  Filled 2021-05-24 (×2): qty 5

## 2021-05-24 MED ORDER — HYDROMORPHONE HCL 1 MG/ML IJ SOLN
0.5000 mg | INTRAMUSCULAR | Status: AC | PRN
  Administered 2021-05-24: 0.5 mg via INTRAVENOUS

## 2021-05-24 MED ORDER — HYDROMORPHONE HCL 1 MG/ML IJ SOLN
1.0000 mg | INTRAMUSCULAR | Status: DC | PRN
  Administered 2021-05-24 – 2021-05-27 (×24): 1 mg via INTRAVENOUS
  Filled 2021-05-24 (×25): qty 1

## 2021-05-24 MED ORDER — HYDROMORPHONE HCL 1 MG/ML IJ SOLN
INTRAMUSCULAR | Status: AC
  Filled 2021-05-24: qty 2

## 2021-05-24 NOTE — Progress Notes (Addendum)
   Subjective:  No acute overnight events. Patient was seen at bedside during rounds this morning. Pt reports feeling much better this AM, as compared to yesterday. Pt is eager to walk today, in hopes of having a bowel movement. Pt denies chest pain, and continues to pass gas. He asked for ice chips. No other complains or concerns at this time.    Objective:  Vital signs in last 24 hours: Vitals:   05/24/21 1000 05/24/21 1030 05/24/21 1100 05/24/21 1130  BP: (!) 186/107 (!) 178/106 (!) 159/117 (!) 185/108  Pulse: 74 73 77 76  Resp: 15 14 16  (!) 22  Temp:      TempSrc:      SpO2:      Weight:      Height:       Constitutional: alert, well-appearing, in no acute distress Cardiovascular: regular rate and rhythm, no m/r/g Pulmonary/Chest: normal work of breathing on room air, lungs clear to auscultation bilaterally Abdominal: soft, tenderness to palpation, non-distended MSK: normal bulk and tone Neurological: alert & oriented x 3  Skin: warm and dry  Assessment/Plan:  Principal Problem:   Small bowel obstruction (HCC) Active Problems:   HTN (hypertension)   ESRD on dialysis (HCC)  Timothy [name] is a 60 y.o. with a pertinent PMH of colon cancer s/p resection, ESRD on HD TThSat, severe asymptomatic HTN, polysubstance use, who presented with abdominal pain with constipation and admitted for small bowel obstruction.   Small bowel obstruction Patient complaining of abdominal pain and constipation, with SBO on imaging and ongoing ilius. S/p ex lap with lysis of adhesions. Continues to have NG tube output 600 mL. Abdominal distention has improved and he continue to pass gas. No BM yet. Remains afebrile. General surgery following, is considering TPN, and tube clamping trial with NG tube. Continue conservative management for now, and monitor for a BM. Pt should tolerate foods, have a BM, and reasonable pain to consider discharge.  - Maintain NPO status with sips and ice chips for bowel  rest  - Maintain NG tube currently, as he continues to have output.  - On IV Tylenol/dilaudid 1 mg q2h prn, per surgery team. Required dilaudid 6 times yesterday.    Severe asymptomatic hypertension SBP 180-190 this AM.  Patient has a history of malignant hypertension. Was on IV labetalol PRN for SBP >180, requiring about 6 times a day, and now on IV hydralazine per nephrology.  - HD per nephrology as above, had HD today.  - Per nephrology, IV hydralazine q4h prn SBP>180 give 10 mg, or SBP>210 give 20 mg   ESRD on HD TThSat - HD per nephrology   Signature: Lajean Manes, MD  Internal Medicine Resident, PGY-1 [facility]  Pager: [phone] 2:11 PM, 05/24/2021  After 5pm on weekdays and 1pm on weekends: On Call pager [phone]

## 2021-05-24 NOTE — Anesthesia Postprocedure Evaluation (Signed)
Anesthesia Post Note  Patient: Timothy [name]  Procedure(s) Performed: EXPLORATORY LAPAROTOMY LYSIS ADHESIONS (Abdomen)     Patient location during evaluation: PACU Anesthesia Type: General Level of consciousness: awake and alert Pain management: pain level controlled Vital Signs Assessment: post-procedure vital signs reviewed and stable Respiratory status: spontaneous breathing, nonlabored ventilation and respiratory function stable Cardiovascular status: blood pressure returned to baseline and stable Postop Assessment: no apparent nausea or vomiting Anesthetic complications: no   No notable events documented.  Last Vitals:  Vitals:   05/24/21 0400 05/24/21 0612  BP: (!) 188/108 (!) 192/112  Pulse: 80 86  Resp:  18  Temp:  36.9 C  SpO2: 91% 94%    Last Pain:  Vitals:   05/24/21 0809  TempSrc:   PainSc: 10-Worst pain ever                 Lynda Rainwater

## 2021-05-24 NOTE — Progress Notes (Addendum)
Pt arrived to HD unit from room 5W12  Pressures high 180's sys initially. Pt complaining about significant abdominal pain. MD at bedside, evaluated, ordered pain medications.  Pt on dialysis for nearly 2 hours, complaining of stomach cramping. Goal slowed, UF paused, NG suction paused, no relief. Pt insisted on ending his tx prematurely, and signed off AMA. Given pain medication. Saying he was in so much pain, and that he was going to pull out his bridled NG tube. This author informed him if he did he would be in a world of pain. He responded, "I'm already in a world of pain." This author: "the tube is tied inside your face." The patient verbalized understanding.  Only removed 630 mL with HD, pressures somewhat improved. Transferred back to sending unit  Pt's NG tube put out 100 mL black/green watery thin foodstuffs while in HD

## 2021-05-24 NOTE — Progress Notes (Signed)
1 Day Post-Op    CC: 48 hours of abdominal pain.  Subjective: Complaining of pain, on HD, no flatus NG put out 1450 cc yesterday, nothing in the cannister in HD.  Midline tender, some post op bleeding, but dressing intact.    Objective: Vital signs in last 24 hours: Temp:  [97.2 F (36.2 C)-98.4 F (36.9 C)] 98.4 F (36.9 C) (08/09 0612) Pulse Rate:  [69-87] 86 (08/09 0612) Resp:  [12-22] 18 (08/09 0612) BP: (130-209)/(81-118) 192/112 (08/09 0612) SpO2:  [86 %-99 %] 94 % (08/09 0612) Last BM Date: 05/20/21 400 IV recorded NG 1450 Afebrile, hypertensive last blood pressure 203/105. Potassium 4.4, glucose 65, BUN 31, creatinine 7.95, WBC 9.6 H/H 10.2/32.3 Platelets 246,000 Intake/Output from previous day: 08/08 0701 - 08/09 0700 In: 400.5 [I.V.:300; IV Piggyback:100.5] Out: 1475 [Emesis/NG output:1450; Blood:25] Intake/Output this shift: No intake/output data recorded.  General appearance: alert, cooperative, no distress, and complaining of pain.   Resp: clear anterior GI: he is not distended, no flatus or BM, abdomen is tender, some bleeding from the dressing on the midline.   Lab Results:  Recent Labs    05/22/21 2352 05/24/21 0107  WBC 5.3 9.6  HGB 10.4* 10.2*  HCT 32.2* 32.3*  PLT 240 246    BMET Recent Labs    05/22/21 0303 05/22/21 2352  NA 136 138  K 4.0 4.4  CL 97* 94*  CO2 29 30  GLUCOSE 75 65*  BUN 18 31*  CREATININE 5.33* 7.95*  CALCIUM 8.0* 8.4*   PT/INR No results for input(s): LABPROT, INR in the last 72 hours.  Recent Labs  Lab 05/19/21 2049 05/21/21 0811 05/22/21 0303 05/22/21 2352  AST 22 18  --   --   ALT 10 9  --   --   ALKPHOS 76 64  --   --   BILITOT 0.8 1.1  --   --   PROT 8.0 6.7  --   --   ALBUMIN 4.0 3.3* 3.2* 3.2*     Lipase     Component Value Date/Time   LIPASE 40 05/19/2021 2049     Medications:  Chlorhexidine Gluconate Cloth  6 each Topical Q0600   doxercalciferol  2 mcg Intravenous Q T,Th,Sa-HD    heparin  5,000 Units Subcutaneous Q8H    sodium chloride     sodium chloride     acetaminophen 1,000 mg (05/24/21 0617)   methocarbamol (ROBAXIN) IV      Anti-infectives (From admission, onward)    Start     Dose/Rate Route Frequency Ordered Stop   05/23/21 1045  ceFAZolin (ANCEF) IVPB 2g/100 mL premix  Status:  Discontinued        2 g 200 mL/hr over 30 Minutes Intravenous On call to O.R. 05/23/21 0952 05/23/21 1851        Assessment/Plan  Small bowel obstruction S/p right hemicolectomy Exploratory laparotomy lysis of adhesions, 05/23/2021 Dr. [name] POD #1  - ongoing ileus/pain  - continue current rx, bowel rest, NG and fluids per Medicine service; continue IV Tylenol/dilaudid 1 mg q2h prn    FEN:NPO/NG ID: Ancef preop DVT: Heparin Follow-up: Dr. Donne Hazel Pain:  IV Tylenol x 3; fentanyl 50 mch x 3; dilaudid 0.5 mg x 4, 1 mg x 3, 1.5 x 1  End-stage renal disease on dialysis Tuesday Thursday Saturday Anemia Secondary hyperparathyroidism Hypertension lesion right kidney possible renal neoplasm Cholelithiasis  Probable malnutrition - check prealbumin in the AM    LOS: 3 days  Rommie Dunn 05/24/2021 Please see Amion

## 2021-05-24 NOTE — Care Management Important Message (Signed)
Important Message  Patient Details  Name: Timothy [name] MRN: [id number] Date of Birth: [birth year]   Medicare Important Message Given:  Yes     Kaydie Petsch 05/24/2021, 3:26 PM

## 2021-05-24 NOTE — Progress Notes (Signed)
[facility] Progress Note   Subjective: Seen on HD, C/O mid line abdominal pain. He is miserable. Decreased UF to allow pain meds to be given.   Objective Vitals:   05/24/21 0817 05/24/21 0845 05/24/21 0852 05/24/21 0930  BP: (!) 203/105 (!) 161/105 (!) 163/104 (!) 179/100  Pulse: 82 74 72 70  Resp: 19 16 12 18   Temp: 98.1 F (36.7 C) 98 F (36.7 C)    TempSrc: Oral Oral    SpO2:  98%    Weight:  61.7 kg    Height:       Physical Exam General: Thin chronically ill appearing male, looks older than stated age. Heart: S1,S2, RRR No M/R/G Lungs: CTAB  Abdomen: NG with bilious drainage to LWS. No BS. Mid line abdominal drsg intact with shadowing present.  Extremities: No LE edema Dialysis Access: L AVF +T/B   Additional Objective Labs: Basic Metabolic Panel: Recent Labs  Lab 05/21/21 0811 05/22/21 0303 05/22/21 2352  NA 136 136 138  K 5.2* 4.0 4.4  CL 90* 97* 94*  CO2 32 29 30  GLUCOSE 90 75 65*  BUN 42* 18 31*  CREATININE 9.50* 5.33* 7.95*  CALCIUM 8.6* 8.0* 8.4*  PHOS  --  3.7 6.2*   Liver Function Tests: Recent Labs  Lab 05/19/21 2049 05/21/21 0811 05/22/21 0303 05/22/21 2352  AST 22 18  --   --   ALT 10 9  --   --   ALKPHOS 76 64  --   --   BILITOT 0.8 1.1  --   --   PROT 8.0 6.7  --   --   ALBUMIN 4.0 3.3* 3.2* 3.2*   Recent Labs  Lab 05/19/21 2049  LIPASE 40   CBC: Recent Labs  Lab 05/19/21 2049 05/21/21 0811 05/22/21 0303 05/22/21 2352 05/24/21 0107 05/24/21 0908  WBC 4.9 4.7 4.6 5.3 9.6 6.6  NEUTROABS 2.9  --   --   --   --   --   HGB 12.5* 11.1* 10.7* 10.4* 10.2* 9.8*  HCT 38.3* 34.0* 33.0* 32.2* 32.3* 30.6*  MCV 91.6 92.9 94.0 93.9 95.3 92.7  PLT 279 252 227 240 246 227   Blood Culture    Component Value Date/Time   SDES BLOOD RIGHT HAND 10/26/2016 1203   SPECREQUEST BOTTLES DRAWN AEROBIC AND ANAEROBIC  5CC 10/26/2016 1203   CULT NO GROWTH 5 DAYS 10/26/2016 1203   REPTSTATUS 10/31/2016 FINAL 10/26/2016 1203     Cardiac Enzymes: No results for input(s): CKTOTAL, CKMB, CKMBINDEX, TROPONINI in the last 168 hours. CBG: No results for input(s): GLUCAP in the last 168 hours. Iron Studies: No results for input(s): IRON, TIBC, TRANSFERRIN, FERRITIN in the last 72 hours. @lablastinr3 @ Studies/Results: DG Abd Portable 1V  Result Date: 05/23/2021 CLINICAL DATA:  Nasogastric tube placement. Laparotomy earlier today. EXAM: PORTABLE ABDOMEN - 1 VIEW COMPARISON:  Multiple prior radiographs earlier today. CT 05/20/2021 FINDINGS: Tip and side port of the enteric tube below the diaphragm in the stomach. Persistent enteric contrast within small bowel centrally. Skin staples just to the right of midline consistent with interval laparotomy, and there are findings consistent with residual intra-abdominal air, not unexpected given recent surgery. Increasing airspace disease at the right lung base. IMPRESSION: Tip and side port of the enteric tube below the diaphragm in the stomach. Electronically Signed   By: Keith Rake M.D.   On: 05/23/2021 23:39   DG Abd Portable 1V  Result Date: 05/23/2021 CLINICAL DATA:  Small bowel obstruction. Acute generalized abdominal pain. EXAM: PORTABLE ABDOMEN - 1 VIEW COMPARISON:  May 22, 2021. FINDINGS: Nasogastric tube tip is seen in proximal stomach. Dilated small bowel loops are noted concerning for distal small bowel obstruction. Contrast is seen in the distal small bowel. No definite colonic dilatation is noted. No definite contrast is noted in the colon. IMPRESSION: Dilated small bowel loops are noted concerning for distal small bowel obstruction. Electronically Signed   By: Marijo Conception M.D.   On: 05/23/2021 09:35   DG Abd Portable 1V-Small Bowel Obstruction Protocol-initial, 8 hr delay  Result Date: 05/22/2021 CLINICAL DATA:  Bowel obstruction EXAM: PORTABLE ABDOMEN - 1 VIEW COMPARISON:  05/22/2021, 05/21/2021, CT 05/20/2021 FINDINGS: Esophageal tube tip overlies gastric  fundal region. Contrast within the stomach. Contrast present within dilated small bowel in the central and right abdomen but no definitive contrast in the colon. Bowel distension similar compared to prior. IMPRESSION: Enteral contrast present within dilated small bowel consistent with small bowel obstruction. No convincing contrast in the colon. Electronically Signed   By: Donavan Foil M.D.   On: 05/22/2021 20:56   DG Abd Portable 1V  Result Date: 05/22/2021 CLINICAL DATA:  Nasogastric tube placement EXAM: PORTABLE ABDOMEN - 1 VIEW COMPARISON:  Yesterday FINDINGS: The enteric tube tip and side-port is still position at the stomach. Enteric contrast is diluted throughout dilated small bowel loops. Atelectasis at the bases. Cardiomegaly. IMPRESSION: 1. Enteric tube is in good position over the stomach. 2. High-grade small bowel obstruction persists with interval dilution of contrast within dilated small bowel loops. Electronically Signed   By: Monte Fantasia M.D.   On: 05/22/2021 11:16   Medications:  sodium chloride     sodium chloride     acetaminophen 1,000 mg (05/24/21 0617)   acetaminophen     methocarbamol (ROBAXIN) IV      Chlorhexidine Gluconate Cloth  6 each Topical Q0600   doxercalciferol  2 mcg Intravenous Q T,Th,Sa-HD   heparin  5,000 Units Subcutaneous Q8H     HD orders: [place] T,Th,S 3:45 hrs 450/Autoflow 1.5 66 kg 2.0K/2.0 Ca UFP 4 AVF -No heparin -Venofer 100 mg IV X 10 doses (3/10 doses given) -Mircera 30 mcg IV q 2 weeks (last dose 05/12/2021)     Assessment/Plan: 1. SBO-persistent SBO failing conservative tx. To OR 05/23/21 per [facility] for exploratory lap/R Hemicolectomy.   2. ESRD -T,Th,S next HD 05/24/2021 3. Anemia - HGB 10.4. Follow HGB. Hold Fe load.  4. Secondary hyperparathyroidism - Binders on hold D/T SBO. Continue VDRA 5. HTN/volume -Variable control of HD. No volume excess by exam.Minimal UF 05/21/21. Currently sl above OP EDW. UF as tolerated.  6. Nutrition -  NPO 7. Lesion R Kidney-possible RCC. Recommending OP FU with MRI.   Caralee Morea H. Jewel Venditto NP-C 05/24/2021, 9:45 AM  [employer] [phone]

## 2021-05-25 LAB — CBC
HCT: 33.6 % — ABNORMAL LOW (ref 39.0–52.0)
Hemoglobin: 11 g/dL — ABNORMAL LOW (ref 13.0–17.0)
MCH: 30.2 pg (ref 26.0–34.0)
MCHC: 32.7 g/dL (ref 30.0–36.0)
MCV: 92.3 fL (ref 80.0–100.0)
Platelets: 221 10*3/uL (ref 150–400)
RBC: 3.64 MIL/uL — ABNORMAL LOW (ref 4.22–5.81)
RDW: 14.3 % (ref 11.5–15.5)
WBC: 7.1 10*3/uL (ref 4.0–10.5)
nRBC: 0 % (ref 0.0–0.2)

## 2021-05-25 LAB — PREALBUMIN: Prealbumin: 14.5 mg/dL — ABNORMAL LOW (ref 18–38)

## 2021-05-25 MED ORDER — HYDRALAZINE HCL 20 MG/ML IJ SOLN
10.0000 mg | INTRAMUSCULAR | Status: DC | PRN
  Administered 2021-05-25 (×3): 10 mg via INTRAVENOUS
  Administered 2021-05-26 (×2): 20 mg via INTRAVENOUS
  Filled 2021-05-25 (×5): qty 1

## 2021-05-25 MED ORDER — OXYCODONE HCL 5 MG PO TABS
5.0000 mg | ORAL_TABLET | ORAL | Status: DC | PRN
  Administered 2021-05-25 – 2021-05-27 (×6): 5 mg via ORAL
  Filled 2021-05-25 (×6): qty 1

## 2021-05-25 NOTE — Progress Notes (Addendum)
[facility] Progress Note   Subjective: Signed off HD early yesterday. Upset because he says hew had no fluid on and "didn't need no more dialysis until I got some fluid back in me". Emotional support to patient and explanation for care.  Next HD 05/27/2021  Objective Vitals:   05/25/21 0000 05/25/21 0355 05/25/21 0400 05/25/21 0741  BP: (!) 179/109 (!) 194/104 (!) 176/100 (!) 187/114  Pulse: 88 89 88 87  Resp: 18 18 16 13   Temp: 98.9 F (37.2 C) 98.5 F (36.9 C) 98.4 F (36.9 C) 97.7 F (36.5 C)  TempSrc: Oral Oral Oral Oral  SpO2: 93% 94% 95%   Weight:      Height:       Physical Exam General: Thin chronically ill appearing male, looks older than stated age. Heart: S1,S2, RRR No M/R/G Lungs: CTAB  Abdomen: NG with bilious drainage to LWS. No BS. Mid line abdominal drsg intact with shadowing present.  Extremities: No LE edema Dialysis Access: L AVF +T/B     Additional Objective Labs: Basic Metabolic Panel: Recent Labs  Lab 05/22/21 0303 05/22/21 2352 05/24/21 0908  NA 136 138 135  K 4.0 4.4 4.8  CL 97* 94* 91*  CO2 29 30 28   GLUCOSE 75 65* 112*  BUN 18 31* 58*  CREATININE 5.33* 7.95* 9.61*  CALCIUM 8.0* 8.4* 8.1*  PHOS 3.7 6.2* 9.5*   Liver Function Tests: Recent Labs  Lab 05/19/21 2049 05/21/21 0811 05/22/21 0303 05/22/21 2352 05/24/21 0908  AST 22 18  --   --   --   ALT 10 9  --   --   --   ALKPHOS 76 64  --   --   --   BILITOT 0.8 1.1  --   --   --   PROT 8.0 6.7  --   --   --   ALBUMIN 4.0 3.3* 3.2* 3.2* 3.2*   Recent Labs  Lab 05/19/21 2049  LIPASE 40   CBC: Recent Labs  Lab 05/19/21 2049 05/21/21 0811 05/22/21 0303 05/22/21 2352 05/24/21 0107 05/24/21 0908 05/25/21 0220  WBC 4.9   < > 4.6 5.3 9.6 6.6 7.1  NEUTROABS 2.9  --   --   --   --   --   --   HGB 12.5*   < > 10.7* 10.4* 10.2* 9.8* 11.0*  HCT 38.3*   < > 33.0* 32.2* 32.3* 30.6* 33.6*  MCV 91.6   < > 94.0 93.9 95.3 92.7 92.3  PLT 279   < > 227 240 246 227  221   < > = values in this interval not displayed.   Blood Culture    Component Value Date/Time   SDES BLOOD RIGHT HAND 10/26/2016 1203   SPECREQUEST BOTTLES DRAWN AEROBIC AND ANAEROBIC  5CC 10/26/2016 1203   CULT NO GROWTH 5 DAYS 10/26/2016 1203   REPTSTATUS 10/31/2016 FINAL 10/26/2016 1203    Cardiac Enzymes: No results for input(s): CKTOTAL, CKMB, CKMBINDEX, TROPONINI in the last 168 hours. CBG: No results for input(s): GLUCAP in the last 168 hours. Iron Studies: No results for input(s): IRON, TIBC, TRANSFERRIN, FERRITIN in the last 72 hours. @lablastinr3 @ Studies/Results: DG Abd Portable 1V  Result Date: 05/23/2021 CLINICAL DATA:  Nasogastric tube placement. Laparotomy earlier today. EXAM: PORTABLE ABDOMEN - 1 VIEW COMPARISON:  Multiple prior radiographs earlier today. CT 05/20/2021 FINDINGS: Tip and side port of the enteric tube below the diaphragm in the stomach. Persistent enteric contrast within small  bowel centrally. Skin staples just to the right of midline consistent with interval laparotomy, and there are findings consistent with residual intra-abdominal air, not unexpected given recent surgery. Increasing airspace disease at the right lung base. IMPRESSION: Tip and side port of the enteric tube below the diaphragm in the stomach. Electronically Signed   By: Keith Rake M.D.   On: 05/23/2021 23:39   Medications:  acetaminophen 1,000 mg (05/25/21 0523)   methocarbamol (ROBAXIN) IV 500 mg (05/25/21 1324)    Chlorhexidine Gluconate Cloth  6 each Topical Q0600   doxercalciferol  2 mcg Intravenous Q T,Th,Sa-HD   heparin  5,000 Units Subcutaneous Q8H     HD orders: [place] T,Th,S 3:45 hrs 450/Autoflow 1.5 66 kg 2.0K/2.0 Ca UFP 4 AVF -No heparin -Venofer 100 mg IV X 10 doses (3/10 doses given) -Mircera 30 mcg IV q 2 weeks (last dose 05/12/2021)     Assessment/Plan: 1. SBO-persistent SBO failing conservative tx. To OR 05/23/21 per [facility] for exploratory lap/R Hemicolectomy.    2. ESRD -T,Th,S next HD 05/27/2021 3. Anemia - HGB 10.4. Follow HGB. Hold Fe load.  4. Secondary hyperparathyroidism - Binders on hold D/T SBO. Continue VDRA 5. HTN/volume -Cramped miserably in HD yesterday, signed off early DT cramping & pain. Net UF 630 ml. Very much under EDW. Very hypertensive, believe pain is contributing to elevated BP. Add IV hydralazine 10-20 mg IV q 4 hours PRN SBP >180.  6. Nutrition - NPO 7. Lesion R Kidney-possible RCC. Recommending OP FU with MRI.    Rita H. Brown NP-C 05/25/2021, 9:40 AM  [facility] [phone]  Pt seen, examined and agree w A/P as above.  Kelly Splinter  MD 05/25/2021, 4:13 PM

## 2021-05-25 NOTE — Progress Notes (Addendum)
   Subjective: No acute overnight events. Patient was seen at bedside during rounds this morning. Pt reports feeling "much better" this AM. He is sitting up in chair. Pt complains of a left buttock ulcer. Pt denies abdominal pain, fever, chills. He reports that he continues to pass a lot of gas and feels like he will have a bowel movement soon. No other complains or concerns at this time.   Objective:  Vital signs in last 24 hours: Vitals:   05/25/21 0355 05/25/21 0400 05/25/21 0741 05/25/21 1201  BP: (!) 194/104 (!) 176/100 (!) 187/114 (!) 184/116  Pulse: 89 88 87 89  Resp: 18 16 13 15   Temp: 98.5 F (36.9 C) 98.4 F (36.9 C) 97.7 F (36.5 C) 98.5 F (36.9 C)  TempSrc: Oral Oral Oral Oral  SpO2: 94% 95%  95%  Weight:      Height:       Constitutional: alert, well-appearing, in no acute distress Cardiovascular: regular rate and rhythm, no m/r/g Pulmonary/Chest: normal work of breathing on room air, lungs clear to auscultation bilaterally Abdominal: soft, tenderness to palpation, non-distended MSK: normal bulk and tone Neurological: alert & oriented x 3  Skin: warm and dry, well defined ulcer on left buttock   Assessment/Plan:  Principal Problem:   Small bowel obstruction (HCC) Active Problems:   HTN (hypertension)   ESRD on dialysis ([place])  Timothy [name] is a 60 y.o. with a pertinent PMH of colon cancer s/p resection, ESRD on HD TThSat, severe asymptomatic HTN, polysubstance use, who presented with abdominal pain with constipation and admitted for small bowel obstruction.   Small bowel obstruction Pt 3 days s/p ex lap with lysis of adhesions. Tolerated NG clamping yesterday and pt reports significant improvement in his symptoms. He does not complain of abdominal pain. He has a well defined left buttock ulcer, wound care nurse consulted. He continues to pass gas, and feels like he will have a bowel movement soon. He requests advancing his diet. He is remains afebrile.  Continue conservative management for now, and monitor for a BM. Pt should tolerate foods, have a BM, and reasonable pain to consider discharge.  - advanced diet  - monitor for BM  - On IV dilaudid 1 mg q2h prn, per surgery team. Required dilaudid 8 times yesterday.    Severe asymptomatic hypertension SBP 180-190 this AM.  Patient has a history of severe asymptomatic HTN. On IV hydralazine prn, per nephrology.  - HD per nephrology as above, having HD tomorrow.  - restarted diltiazem 360 mg as he is now tolerating PO  - Per nephrology, IV hydralazine q4h prn SBP>180 give 10 mg, or SBP>210 give 20 mg   ESRD on HD TThSat - HD per nephrology  Best Practice: Diet: thin fluid IVF: none  VTE: Heparin 5000 units  Code: Full  Signature: Lajean Manes, MD  Internal Medicine Resident, PGY-1 [facility]  Pager: [phone] 3:19 PM, 05/25/2021  After 5pm on weekdays and 1pm on weekends: On Call pager [phone]

## 2021-05-25 NOTE — Progress Notes (Signed)
Physical Therapy Evaluation Patient Details Name: Timothy [name] MRN: [id number] DOB: [birth year] Today's Date: 05/25/2021   History of Present Illness  Pt is a 60yo male who presented to [facility] ED on 8/5 with complaints of abdominal pain and nausea. Imaging showed small bowel obstruction. Pt underwent exploratory laparotomy with lysis of adhesions on 8/8. 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  Clinical Impression  Pt presents with the impairments above and problems listed below. Pt required min guard to min assist for bed mobility, min guard to supervision for ambulation. Mobility limited mostly by surgical site pain. Pt motivated to resume independent mobility. Educated pt on importance of sitting up in chair, splinting abdomen during mobility, and walking for recovery. Anticipate pt will progress well and will not require follow-up PT. We will continue to follow him acutely to promote independence with functional mobility.    Follow Up Recommendations No PT follow up    Equipment Recommendations  None recommended by PT    Recommendations for Other Services       Precautions / Restrictions Precautions Precautions: Fall Restrictions Weight Bearing Restrictions: No      Mobility  Bed Mobility Overal bed mobility: Needs Assistance Bed Mobility: Rolling;Sidelying to Sit Rolling: Min assist;Min guard Sidelying to sit: Min assist;Min guard       General bed mobility comments: Pt required min guard to min assist for advancement of LEs and trunk control. Education provided on logroll bed mobility technique and splinting abdomen during mobility tasks.    Transfers Overall transfer level: Needs assistance Equipment used: None Transfers: Sit to/from Stand Sit to Stand: Min guard         General transfer comment: pt required min guard for safety and line management  Ambulation/Gait Ambulation/Gait assistance: Supervision;Min guard Gait Distance  (Feet): 8 Feet Assistive device: IV Pole;None (pt let go of IV pole halfway through mobility task) Gait Pattern/deviations: Step-through pattern;Decreased step length - right;Decreased step length - left Gait velocity: decreased   General Gait Details: Pt demonstrated decreased step length and was mildly unsteady, however, no overt LOB. Initially used IV pole for steadying but let go halfway through ambulation. Pt min guard to supervision for safety and line management.  Stairs            Wheelchair Mobility    Modified Rankin (Stroke Patients Only)       Balance Overall balance assessment: Mild deficits observed, not formally tested                                           Pertinent Vitals/Pain Pain Assessment: 0-10 Pain Score: 10-Worst pain ever Pain Location: surgical site Pain Descriptors / Indicators: Operative site guarding;Grimacing;Discomfort Pain Intervention(s): Premedicated before session;Limited activity within patient's tolerance;Repositioned (pt reported he had just received pain meds prior to entry)    Home Living Family/patient expects to be discharged to:: Private residence Living Arrangements: Spouse/significant other;Children Available Help at Discharge: Family;Available 24 hours/day Type of Home: Apartment Home Access: Stairs to enter Entrance Stairs-Rails: Left Entrance Stairs-Number of Steps: at least 2 flights (reports on the 3rd floor) Home Layout: One level Home Equipment: None      Prior Function Level of Independence: Independent               Hand Dominance   Dominant Hand: Right    Extremity/Trunk Assessment  Upper Extremity Assessment Upper Extremity Assessment: Overall WFL for tasks assessed    Lower Extremity Assessment Lower Extremity Assessment: Overall WFL for tasks assessed    Cervical / Trunk Assessment Cervical / Trunk Assessment: Kyphotic  Communication   Communication: No difficulties   Cognition Arousal/Alertness: Awake/alert Behavior During Therapy: WFL for tasks assessed/performed Overall Cognitive Status: Within Functional Limits for tasks assessed                                        General Comments General comments (skin integrity, edema, etc.): Pt reported decreased pain at end of session 7/10. Educated about incentive spirometry use.    Exercises     Assessment/Plan    PT Assessment Patient needs continued PT services  PT Problem List Decreased strength;Decreased range of motion;Decreased activity tolerance;Decreased balance;Decreased mobility;Decreased knowledge of use of DME;Decreased safety awareness;Decreased knowledge of precautions;Pain       PT Treatment Interventions DME instruction;Gait training;Stair training;Therapeutic activities;Functional mobility training;Therapeutic exercise;Balance training;Patient/family education    PT Goals (Current goals can be found in the Care Plan section)  Acute Rehab PT Goals Patient Stated Goal: to reduce pain PT Goal Formulation: With patient Time For Goal Achievement: 06/08/21 Potential to Achieve Goals: Good    Frequency Min 3X/week   Barriers to discharge        Co-evaluation               AM-PAC PT "6 Clicks" Mobility  Outcome Measure Help needed turning from your back to your side while in a flat bed without using bedrails?: A Little Help needed moving from lying on your back to sitting on the side of a flat bed without using bedrails?: A Little Help needed moving to and from a bed to a chair (including a wheelchair)?: A Little Help needed standing up from a chair using your arms (e.g., wheelchair or bedside chair)?: A Little Help needed to walk in hospital room?: A Little Help needed climbing 3-5 steps with a railing? : A Lot 6 Click Score: 17    End of Session Equipment Utilized During Treatment: Gait belt Activity Tolerance: Patient tolerated treatment  well Patient left: in chair;with call bell/phone within reach;with chair alarm set Nurse Communication: Mobility status PT Visit Diagnosis: Muscle weakness (generalized) (M62.81);Unsteadiness on feet (R26.81)    Time: 5093-2671 PT Time Calculation (min) (ACUTE ONLY): 27 min   Charges:   PT Evaluation $PT Eval Low Complexity: 1 Low PT Treatments $Therapeutic Activity: 8-22 mins        Dawayne Cirri, SPT  Dawayne Cirri 05/25/2021, 2:04 PM

## 2021-05-25 NOTE — Progress Notes (Addendum)
2 Days Post-Op    CC: 48 hours of abdominal pain  Subjective: Pt feeling better this AM, says he has some flatus.  No BM.  Wound OK, I took dressing down, it was covered with old blood.    Objective: Vital signs in last 24 hours: Temp:  [97.7 F (36.5 C)-98.9 F (37.2 C)] 97.7 F (36.5 C) (08/10 0741) Pulse Rate:  [70-89] 87 (08/10 0741) Resp:  [11-22] 13 (08/10 0741) BP: (159-194)/(100-117) 187/114 (08/10 0741) SpO2:  [90 %-97 %] 95 % (08/10 0400) Last BM Date: 05/20/21 Nothing p.o. recorded 787 IV NG 600 recorded for shift nothing recorded second shift No BM recorded Afebrile blood pressure is elevated last BP 187/114, on 2 L nasal cannula Prealbumin 14.5 WBC 7.1 H/H 11/33.6  Intake/Output from previous day: 08/09 0701 - 08/10 0700 In: 787.3 [I.V.:513.4; IV Piggyback:273.9] Out: 600 [Emesis/NG output:600] Intake/Output this shift: No intake/output data recorded.  General appearance: alert, cooperative, and no distress Resp: clear to auscultation bilaterally and moving about 800 on IS GI: soft, no distension, incision ok, few BS, + flatus  Lab Results:  Recent Labs    05/24/21 0908 05/25/21 0220  WBC 6.6 7.1  HGB 9.8* 11.0*  HCT 30.6* 33.6*  PLT 227 221    BMET Recent Labs    05/22/21 2352 05/24/21 0908  NA 138 135  K 4.4 4.8  CL 94* 91*  CO2 30 28  GLUCOSE 65* 112*  BUN 31* 58*  CREATININE 7.95* 9.61*  CALCIUM 8.4* 8.1*   PT/INR No results for input(s): LABPROT, INR in the last 72 hours.  Recent Labs  Lab 05/19/21 2049 05/21/21 0811 05/22/21 0303 05/22/21 2352 05/24/21 0908  AST 22 18  --   --   --   ALT 10 9  --   --   --   ALKPHOS 76 64  --   --   --   BILITOT 0.8 1.1  --   --   --   PROT 8.0 6.7  --   --   --   ALBUMIN 4.0 3.3* 3.2* 3.2* 3.2*     Lipase     Component Value Date/Time   LIPASE 40 05/19/2021 2049     Medications:  Chlorhexidine Gluconate Cloth  6 each Topical Q0600   doxercalciferol  2 mcg Intravenous Q  T,Th,Sa-HD   heparin  5,000 Units Subcutaneous Q8H    Assessment/Plan Small bowel obstruction S/p right hemicolectomy Exploratory laparotomy lysis of adhesions, 05/23/2021 Dr. [name] POD #2  - ongoing ileus/pain  - continue current rx, bowel rest, NG and fluids per Medicine service; continue IV Tylenol/dilaudid 1 mg q2h prn     FEN:NPO/NG ID: Ancef preop DVT: Heparin Follow-up: Dr. Donne Hazel Pain:  IV Tylenol x 3; fentanyl 50 mch x 3; dilaudid 0.5 mg x 4, 1 mg x 3, 1.5 x 1   End-stage renal disease on dialysis Tuesday Thursday Saturday Anemia Secondary hyperparathyroidism Hypertension lesion right kidney possible renal neoplasm Cholelithiasis  Probable malnutrition - check prealbumin in the AM  Plan:  sips and chips today, clamping trial with his NG.       LOS: 4 days    Godfrey Tritschler 05/25/2021 Please see Amion

## 2021-05-26 LAB — CBC
HCT: 34.3 % — ABNORMAL LOW (ref 39.0–52.0)
Hemoglobin: 11.7 g/dL — ABNORMAL LOW (ref 13.0–17.0)
MCH: 30.7 pg (ref 26.0–34.0)
MCHC: 34.1 g/dL (ref 30.0–36.0)
MCV: 90 fL (ref 80.0–100.0)
Platelets: 238 10*3/uL (ref 150–400)
RBC: 3.81 MIL/uL — ABNORMAL LOW (ref 4.22–5.81)
RDW: 13.9 % (ref 11.5–15.5)
WBC: 9.3 10*3/uL (ref 4.0–10.5)
nRBC: 0 % (ref 0.0–0.2)

## 2021-05-26 MED ORDER — ACETAMINOPHEN 500 MG PO TABS: 1000.0000 mg | ORAL_TABLET | Freq: Four times a day (QID) | ORAL | Status: DC | PRN

## 2021-05-26 MED ORDER — PROSOURCE PLUS PO LIQD
30.0000 mL | Freq: Three times a day (TID) | ORAL | Status: DC
  Administered 2021-05-26 – 2021-05-27 (×4): 30 mL via ORAL
  Filled 2021-05-26 (×4): qty 30

## 2021-05-26 MED ORDER — ONDANSETRON HCL 4 MG PO TABS: 4.0000 mg | ORAL_TABLET | ORAL | Status: DC | PRN

## 2021-05-26 MED ORDER — ACETAMINOPHEN 10 MG/ML IV SOLN
1000.0000 mg | Freq: Four times a day (QID) | INTRAVENOUS | Status: DC
  Administered 2021-05-26: 1000 mg via INTRAVENOUS
  Filled 2021-05-26 (×2): qty 100

## 2021-05-26 MED ORDER — BOOST / RESOURCE BREEZE PO LIQD CUSTOM
1.0000 | Freq: Three times a day (TID) | ORAL | Status: DC
  Administered 2021-05-26 – 2021-05-27 (×4): 1 via ORAL

## 2021-05-26 MED ORDER — DILTIAZEM HCL ER COATED BEADS 180 MG PO CP24
360.0000 mg | ORAL_CAPSULE | Freq: Every day | ORAL | Status: DC
  Administered 2021-05-26 – 2021-05-27 (×2): 360 mg via ORAL
  Filled 2021-05-26 (×2): qty 2

## 2021-05-26 MED ORDER — RENA-VITE PO TABS
1.0000 | ORAL_TABLET | Freq: Every day | ORAL | Status: DC
  Administered 2021-05-26: 1 via ORAL
  Filled 2021-05-26: qty 1

## 2021-05-26 MED ORDER — METHOCARBAMOL 500 MG PO TABS: 500.0000 mg | ORAL_TABLET | Freq: Three times a day (TID) | ORAL | Status: DC | PRN

## 2021-05-26 NOTE — Progress Notes (Signed)
Initial Nutrition Assessment  DOCUMENTATION CODES:   Underweight, Severe malnutrition in context of chronic illness  INTERVENTION:   -Boost Breeze po TID, each supplement provides 250 kcal and 9 grams of protein  -30 ml Prosource Plus TID, each supplement provides 100 kcals and 15 grams protein -Renal MVI daily  NUTRITION DIAGNOSIS:   Severe Malnutrition related to chronic illness (ESRD on HD) as evidenced by severe fat depletion, moderate fat depletion, moderate muscle depletion, severe muscle depletion.  GOAL:   Patient will meet greater than or equal to 90% of their needs  MONITOR:   PO intake, Supplement acceptance, Diet advancement, Labs, Skin, Weight trends, I & O's  REASON FOR ASSESSMENT:   NPO/Clear Liquid Diet, Consult Assessment of nutrition requirement/status  ASSESSMENT:   Timothy [name] is a 60 year old male with a past medical history of colon cancer status post resection and ESRD on HD, hypertension presenting with abdominal pain and constipation currently admitted for small bowel obstruction.  Pt admitted with SBO  8/6- NGT removed per pt request 8/7- NGT replaced 8/8- s/p Procedure: Exploratory laparotomy with lysis of adhesions 8/10- NGT removed, tolerated clamping trials 8/11- advanced to clear liquid diet   Reviewed I/O's: -300 ml x 24 hours and -1.8 L since admission  NGT output: 300 ml x 24 hours  Pt sitting in recliner chair at time of visit, sipping on clear liquid tray at time of visit. Pt on phone and not very interactive with this RD. Pt reports that he does not have any abdominal pain at this time, but is trying to "take it slow" with PO intake, as he is still getting used to eating.   Reviewed wt hx; pt has experienced a 15% wt loss over the past year. While this is not significant for time frame, it is concerning given underweight status and multiple medical issues. Per nephrology notes, EDW 66 kg. Pt below dry weight.   Per general surgery  notes, likely plan to advance to full liquids tomorrow.   Medications reviewed.   Labs reviewed.   NUTRITION - FOCUSED PHYSICAL EXAM:  Flowsheet Row Most Recent Value  Orbital Region Moderate depletion  Upper Arm Region Severe depletion  Thoracic and Lumbar Region Severe depletion  Buccal Region Severe depletion  Temple Region Moderate depletion  Clavicle Bone Region Severe depletion  Clavicle and Acromion Bone Region Severe depletion  Scapular Bone Region Severe depletion  Dorsal Hand Moderate depletion  Patellar Region Severe depletion  Anterior Thigh Region Severe depletion  Posterior Calf Region Severe depletion  Edema (RD Assessment) None  Hair Reviewed  Eyes Reviewed  Mouth Reviewed  Skin Reviewed  Nails Reviewed       Diet Order:   Diet Order             Diet clear liquid Room service appropriate? Yes; Fluid consistency: Thin  Diet effective now                   EDUCATION NEEDS:   No education needs have been identified at this time  Skin:  Skin Assessment: Skin Integrity Issues: Skin Integrity Issues:: Incisions Incisions: closed abdomen  Last BM:  05/20/21  Height:   Ht Readings from Last 1 Encounters:  05/19/21 6\' 1"  (1.854 m)    Weight:   Wt Readings from Last 1 Encounters:  05/24/21 61.7 kg    Ideal Body Weight:  83.6 kg  BMI:  Body mass index is 17.95 kg/m.  Estimated Nutritional Needs:   Kcal:  3601-6580  Protein:  125-150 grams  Fluid:  1000 ml + UOP    Loistine Chance, RD, LDN, [place] Registered Dietitian II Certified Diabetes Care and Education Specialist Please refer to [facility] for RD and/or RD on-call/weekend/after hours pager

## 2021-05-26 NOTE — Progress Notes (Addendum)
[facility] Progress Note   Subjective: Seen in room. Up in chair. HD tomorrow on adjusted schedule.   Objective Vitals:   05/25/21 2059 05/26/21 0000 05/26/21 0404 05/26/21 0744  BP:  (!) 180/101 (!) 180/103 (!) 164/100  Pulse:  88  91  Resp:  16 17 16   Temp:  97.7 F (36.5 C) 98.9 F (37.2 C) 98.9 F (37.2 C)  TempSrc:  Oral Oral Oral  SpO2: 93% 91% 94%   Weight:      Height:       Physical Exam General: Thin chronically ill appearing male, looks older than stated age. Heart: S1,S2, RRR No M/R/G Lungs: CTAB  Abdomen: Abdomen F, ND. Mid line abdominal incision drsg intact.  Extremities: No LE edema Dialysis Access: L AVF +T/B   Additional Objective Labs: Basic Metabolic Panel: Recent Labs  Lab 05/22/21 0303 05/22/21 2352 05/24/21 0908  NA 136 138 135  K 4.0 4.4 4.8  CL 97* 94* 91*  CO2 29 30 28   GLUCOSE 75 65* 112*  BUN 18 31* 58*  CREATININE 5.33* 7.95* 9.61*  CALCIUM 8.0* 8.4* 8.1*  PHOS 3.7 6.2* 9.5*   Liver Function Tests: Recent Labs  Lab 05/19/21 2049 05/21/21 0811 05/22/21 0303 05/22/21 2352 05/24/21 0908  AST 22 18  --   --   --   ALT 10 9  --   --   --   ALKPHOS 76 64  --   --   --   BILITOT 0.8 1.1  --   --   --   PROT 8.0 6.7  --   --   --   ALBUMIN 4.0 3.3* 3.2* 3.2* 3.2*   Recent Labs  Lab 05/19/21 2049  LIPASE 40   CBC: Recent Labs  Lab 05/19/21 2049 05/21/21 0811 05/22/21 2352 05/24/21 0107 05/24/21 0908 05/25/21 0220 05/26/21 0311  WBC 4.9   < > 5.3 9.6 6.6 7.1 9.3  NEUTROABS 2.9  --   --   --   --   --   --   HGB 12.5*   < > 10.4* 10.2* 9.8* 11.0* 11.7*  HCT 38.3*   < > 32.2* 32.3* 30.6* 33.6* 34.3*  MCV 91.6   < > 93.9 95.3 92.7 92.3 90.0  PLT 279   < > 240 246 227 221 238   < > = values in this interval not displayed.   Blood Culture    Component Value Date/Time   SDES BLOOD RIGHT HAND 10/26/2016 1203   SPECREQUEST BOTTLES DRAWN AEROBIC AND ANAEROBIC  5CC 10/26/2016 1203   CULT NO GROWTH 5  DAYS 10/26/2016 1203   REPTSTATUS 10/31/2016 FINAL 10/26/2016 1203    Cardiac Enzymes: No results for input(s): CKTOTAL, CKMB, CKMBINDEX, TROPONINI in the last 168 hours. CBG: No results for input(s): GLUCAP in the last 168 hours. Iron Studies: No results for input(s): IRON, TIBC, TRANSFERRIN, FERRITIN in the last 72 hours. @lablastinr3 @ Studies/Results: No results found. Medications:  acetaminophen     methocarbamol (ROBAXIN) IV 500 mg (05/26/21 0701)    (feeding supplement) PROSource Plus  30 mL Oral TID BM   Chlorhexidine Gluconate Cloth  6 each Topical Q0600   doxercalciferol  2 mcg Intravenous Q T,Th,Sa-HD   feeding supplement  1 Container Oral TID BM   heparin  5,000 Units Subcutaneous Q8H   multivitamin  1 tablet Oral QHS     HD orders: [place] T,Th,S 3:45 hrs 450/Autoflow 1.5 66 kg 2.0K/2.0 Ca UFP 4  AVF -No heparin -Venofer 100 mg IV X 10 doses (3/10 doses given) -Mircera 30 mcg IV q 2 weeks (last dose 05/12/2021)     Assessment/Plan: 1. SBO-persistent SBO failing conservative tx. To OR 05/23/21 per [facility] for exploratory lap/R Hemicolectomy.   2. ESRD -T,Th,S next HD 05/27/2021. Due to staffing issues we are doing HD just 2 times this week when possible.  3. Anemia - HGB 11.7 Follow HGB. Hold Fe load.  4. Secondary hyperparathyroidism - Binders on hold D/T SBO. Continue VDRA 5. HTN/volume -Cramped miserably in HD 05/24/2021, signed off early DT cramping & pain. Net UF 630 ml. Very much under EDW. Very hypertensive, believe pain is contributing to elevated BP. Add IV hydralazine 10-20 mg IV q 4 hours PRN SBP >180. Still elevated. UF as tolerated in HD tomorrow.  7. BMD: PO4 high, C Ca at goal. Binders have been on hold. Resume binders when eating.  6. Nutrition - Started clear liquids. Albumin 3.2. Protein supps when eating.  7. Lesion R Kidney-possible RCC. Recommending OP FU with MRI.    Rita H. Brown NP-C 05/26/2021, 11:41 AM  [facility] [phone]  Pt seen, examined and agree w assess/plan as above with additions as indicated.  [facility] 05/26/2021, 3:01 PM

## 2021-05-26 NOTE — Progress Notes (Signed)
   Subjective: No acute overnight events. Patient was seen at bedside during rounds this morning. Pt reports feeling great this AM. Pain at incision sites for which he is taking pain medications. Pt is hungry and requesting to advance diet. Has had multiple bowel movements.   Objective:  Vital signs in last 24 hours: Vitals:   05/26/21 0000 05/26/21 0404 05/26/21 0744 05/26/21 1220  BP: (!) 180/101 (!) 180/103 (!) 164/100 (!) 164/103  Pulse: 88  91 85  Resp: 16 17 16 18   Temp: 97.7 F (36.5 C) 98.9 F (37.2 C) 98.9 F (37.2 C) 97.6 F (36.4 C)  TempSrc: Oral Oral Oral Oral  SpO2: 91% 94%  96%  Weight:      Height:       Constitutional: alert, well-appearing, in no acute distress Cardiovascular: regular rate and rhythm, no m/r/g Pulmonary/Chest: normal work of breathing on room air, lungs clear to auscultation bilaterally Abdominal: soft, tenderness to palpation, non-distended MSK: normal bulk and tone Neurological: alert & oriented x 3  Skin: warm and dry  Assessment/Plan:  Principal Problem:   Small bowel obstruction (HCC) Active Problems:   HTN (hypertension)   ESRD on dialysis ([facility])  Timothy [name] is a 60 y.o. with a pertinent PMH of colon cancer s/p resection, ESRD on HD TThSat, severe asymptomatic HTN, polysubstance use, who presented with abdominal pain with constipation and admitted for small bowel obstruction.   Small bowel obstruction Pt 4 days s/p ex lap with lysis of adhesions. Tolerated NG clamping 2 days ago and pt reports significant improvement in his symptoms. Complains of mild pain at incision site that is well controlled with pain meds. He continues to pass gas and had multiple BM. He requests advancing his diet. He is remains afebrile. Per surgery, he is stable for d/c from surgical perspective. If patient continues to tolerate solid food, pain remains reasonable and he tolerates HD, potential d/c today.  - advanced diet  - On PO dilaudid 4 mg q4h prn.  Continue to monitor.    Severe asymptomatic hypertension SBP 130-150s this AM.  Patient has a history of severe asymptomatic HTN. On IV hydralazine prn, per nephrology.  - HD per nephrology as above, having HD today.  - restarted diltiazem 360 mg and is tolerating PO  - Per nephrology, IV hydralazine q4h prn SBP>180 give 10 mg, or SBP>210 give 20 mg   ESRD on HD TThSat - HD per nephrology - Consider d/c if tolerates HD today  Best Practice: Diet: renal diet  IVF: none  VTE: Heparin 5000 units  Code: Full  Signature: Lajean Manes, MD  Internal Medicine Resident, PGY-1 [facility]  Pager: [phone] 2:26 PM, 05/26/2021  After 5pm on weekdays and 1pm on weekends: On Call pager [phone]

## 2021-05-26 NOTE — Progress Notes (Addendum)
3 Days Post-Op    CC: abdominal pain  Subjective: He is up in the chair, + flatus, No Bm, wants something to drink.    Objective: Vital signs in last 24 hours: Temp:  [97.7 F (36.5 C)-98.9 F (37.2 C)] 98.9 F (37.2 C) (08/11 0404) Pulse Rate:  [85-91] 88 (08/11 0000) Resp:  [13-18] 17 (08/11 0404) BP: (169-187)/(99-116) 180/103 (08/11 0404) SpO2:  [91 %-95 %] 94 % (08/11 0404) Last BM Date: 05/20/21 Nothing p.o. recorded NG 300 recorded No other intake or output recorded. Afebrile blood pressure still elevated 180/103 at 0300 hours this AM. CBC is stable. Intake/Output from previous day: 08/10 0701 - 08/11 0700 In: 0  Out: 300 [Emesis/NG output:300] Intake/Output this shift: No intake/output data recorded.  General appearance: alert, cooperative, and no distress Resp: clear to auscultation bilaterally GI: soft, + BS, incision looks fine  Left perirectal skin break down - I have ask staff to clean with soap and water.  Redress with protective dressing Lab Results:  Recent Labs    05/25/21 0220 05/26/21 0311  WBC 7.1 9.3  HGB 11.0* 11.7*  HCT 33.6* 34.3*  PLT 221 238    BMET Recent Labs    05/24/21 0908  NA 135  K 4.8  CL 91*  CO2 28  GLUCOSE 112*  BUN 58*  CREATININE 9.61*  CALCIUM 8.1*   PT/INR No results for input(s): LABPROT, INR in the last 72 hours.  Recent Labs  Lab 05/19/21 2049 05/21/21 0811 05/22/21 0303 05/22/21 2352 05/24/21 0908  AST 22 18  --   --   --   ALT 10 9  --   --   --   ALKPHOS 76 64  --   --   --   BILITOT 0.8 1.1  --   --   --   PROT 8.0 6.7  --   --   --   ALBUMIN 4.0 3.3* 3.2* 3.2* 3.2*     Lipase     Component Value Date/Time   LIPASE 40 05/19/2021 2049     Medications:  Chlorhexidine Gluconate Cloth  6 each Topical Q0600   doxercalciferol  2 mcg Intravenous Q T,Th,Sa-HD   heparin  5,000 Units Subcutaneous Q8H    Assessment/Plan Small bowel obstruction S/p right hemicolectomy Exploratory laparotomy  lysis of adhesions, 05/23/2021 Dr. [name] POD #3  -      HUT:MLYYTK liquids >> advance to fulls if he tolerates clears, nutrition consult to help with supplements ID: Ancef preop DVT: Heparin Follow-up: Dr. Donne Hazel Pain:  IV Tylenol x 3; fentanyl 50 mch x 3; dilaudid 0.5 mg x 4, 1 mg x 3, 1.5 x 1   End-stage renal disease on dialysis Tuesday Thursday Saturday Anemia Secondary hyperparathyroidism Hypertension lesion right kidney possible renal neoplasm Cholelithiasis  moderate malnutrition - prealbumin 14.5 Left perirectal skin break down - I have ask staff to clean with soap and water.  Redress with protective dressing      LOS: 5 days    Timothy [name] 05/26/2021 Please see Amion  Agree with above.

## 2021-05-26 NOTE — Consult Note (Signed)
WOC Nurse Consult Note: Patient receiving care in [place]. Reason for Consult: Left buttock wound Wound type: in completing this consult I spoke with the patient's primary RN, Venida Jarvis, via telephone.  The nurse reports that the area is dry, light brown, and healed over. It is no longer an active open wound. The area is approximately nickel size. Pressure Injury POA: Yes/No/NA Measurement: Wound bed: Drainage (amount, consistency, odor)  Periwound: Dressing procedure/placement/frequency: Gently wash left buttock healing wound with no rinse spray or soap and water. Pat dry. Place a small foam dressing over the area. Change the foam every 3 days and prn. [place] nurse will not follow at this time.  Please re-consult the [place] team if needed.  Val Riles, RN, MSN, CWOCN, CNS-BC, pager [phone]

## 2021-05-26 NOTE — Progress Notes (Signed)
Physical Therapy Treatment Patient Details Name: Timothy [name] MRN: [id number] DOB: 1960-11-02 Today's Date: 05/26/2021    History of Present Illness Pt is a 60yo male who presented to [facility] ED on 8/5 with complaints of abdominal pain and nausea. Imaging showed small bowel obstruction. Pt underwent exploratory laparotomy with lysis of adhesions on 8/8. 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    PT Comments    Patient received up in chair asleep- easily woken and with very odd behaviors today, see cognition section for details, RN aware. Not sure if behaviors were acute cognition changes or just him being intentionally careless/rude. Regardless, able to mobilize well overall. Left up in recliner with all needs met this morning and RN aware of patient status. Progressing well mobility wise overall.     Follow Up Recommendations  No PT follow up     Equipment Recommendations  None recommended by PT    Recommendations for Other Services       Precautions / Restrictions Precautions Precautions: Fall Precaution Comments: abdominal incision Restrictions Weight Bearing Restrictions: No    Mobility  Bed Mobility               General bed mobility comments: up in recliner    Transfers Overall transfer level: Needs assistance Equipment used: Rolling walker (2 wheeled) Transfers: Sit to/from Stand Sit to Stand: Min guard         General transfer comment: min guard for safety and line management  Ambulation/Gait Ambulation/Gait assistance: Min guard Gait Distance (Feet): 250 Feet Assistive device: Rolling walker (2 wheeled) Gait Pattern/deviations: Step-through pattern;Decreased step length - right;Decreased step length - left Gait velocity: decreased   General Gait Details: impulsive today- at first refused to let me adjust walker to his height. Once he finally agreed, he was safer with it but still very impulsive in the hallway. Did not  follow cues and does not like to be touched.   Stairs             Wheelchair Mobility    Modified Rankin (Stroke Patients Only)       Balance Overall balance assessment: Mild deficits observed, not formally tested                                          Cognition Arousal/Alertness: Awake/alert Behavior During Therapy: Agitated;Impulsive Overall Cognitive Status: No family/caregiver present to determine baseline cognitive functioning                                 General Comments: very short with PT today, impulsive and with basically zero safety awareness- initially told me he is not hurting, then perseverated on getting pain meds from RN (already premedicated on schedule per RN) and became very agitated with me when I told him I would need to return later in the day if he wanted to wait for next dose of pain meds. Then, demanded I walk with him but refused to allow me to adjust RW and almost had a fall in the room trying to use RW taht was too short for him. After he allowed me to adjust it at the doorway, we walked in the hallway and he was very unsafe and impulsive, almost knocking over a slower/less mobile patient even though I told  him to STOP to allow them to pass, also would unpredictably walk backwards or stop without warning. Unsure if this was acute confusion or just him being intentionally rude and careless.      Exercises      General Comments        Pertinent Vitals/Pain Pain Assessment: No/denies pain Pain Score: 0-No pain Pain Intervention(s): Limited activity within patient's tolerance;Monitored during session    Home Living                      Prior Function            PT Goals (current goals can now be found in the care plan section) Acute Rehab PT Goals Patient Stated Goal: to reduce pain PT Goal Formulation: With patient Time For Goal Achievement: 06/08/21 Potential to Achieve Goals: Good Progress  towards PT goals: Progressing toward goals    Frequency    Min 3X/week      PT Plan Current plan remains appropriate    Co-evaluation              AM-PAC PT "6 Clicks" Mobility   Outcome Measure  Help needed turning from your back to your side while in a flat bed without using bedrails?: A Little Help needed moving from lying on your back to sitting on the side of a flat bed without using bedrails?: A Little Help needed moving to and from a bed to a chair (including a wheelchair)?: A Little Help needed standing up from a chair using your arms (e.g., wheelchair or bedside chair)?: A Little Help needed to walk in hospital room?: A Little Help needed climbing 3-5 steps with a railing? : A Little 6 Click Score: 18    End of Session   Activity Tolerance: Patient tolerated treatment well Patient left: in chair;with call bell/phone within reach Nurse Communication: Mobility status PT Visit Diagnosis: Muscle weakness (generalized) (M62.81);Unsteadiness on feet (R26.81)     Time: 5038-8828 PT Time Calculation (min) (ACUTE ONLY): 16 min  Charges:  $Gait Training: 8-22 mins                    Windell Norfolk, DPT, PN2   Supplemental Physical Therapist [place]    Pager [phone] Acute Rehab Office [phone]

## 2021-05-27 LAB — CBC
HCT: 31.1 % — ABNORMAL LOW (ref 39.0–52.0)
HCT: 28.4 % — ABNORMAL LOW (ref 39.0–52.0)
Hemoglobin: 10.2 g/dL — ABNORMAL LOW (ref 13.0–17.0)
Hemoglobin: 9.8 g/dL — ABNORMAL LOW (ref 13.0–17.0)
MCH: 29.7 pg (ref 26.0–34.0)
MCH: 30.4 pg (ref 26.0–34.0)
MCHC: 32.8 g/dL (ref 30.0–36.0)
MCHC: 34.5 g/dL (ref 30.0–36.0)
MCV: 90.4 fL (ref 80.0–100.0)
MCV: 88.2 fL (ref 80.0–100.0)
Platelets: 262 10*3/uL (ref 150–400)
Platelets: 271 10*3/uL (ref 150–400)
RBC: 3.44 MIL/uL — ABNORMAL LOW (ref 4.22–5.81)
RBC: 3.22 MIL/uL — ABNORMAL LOW (ref 4.22–5.81)
RDW: 13.8 % (ref 11.5–15.5)
RDW: 13.6 % (ref 11.5–15.5)
WBC: 6.5 10*3/uL (ref 4.0–10.5)
WBC: 6.8 10*3/uL (ref 4.0–10.5)
nRBC: 0 % (ref 0.0–0.2)
nRBC: 0 % (ref 0.0–0.2)

## 2021-05-27 LAB — RENAL FUNCTION PANEL
Albumin: 2.9 g/dL — ABNORMAL LOW (ref 3.5–5.0)
Anion gap: 19 — ABNORMAL HIGH (ref 5–15)
BUN: 86 mg/dL — ABNORMAL HIGH (ref 6–20)
CO2: 25 mmol/L (ref 22–32)
Calcium: 8.4 mg/dL — ABNORMAL LOW (ref 8.9–10.3)
Chloride: 86 mmol/L — ABNORMAL LOW (ref 98–111)
Creatinine, Ser: 10.48 mg/dL — ABNORMAL HIGH (ref 0.61–1.24)
GFR, Estimated: 5 mL/min — ABNORMAL LOW (ref >60)
Glucose, Bld: 101 mg/dL — ABNORMAL HIGH (ref 70–99)
Phosphorus: 7.5 mg/dL — ABNORMAL HIGH (ref 2.5–4.6)
Potassium: 4.2 mmol/L (ref 3.5–5.1)
Sodium: 130 mmol/L — ABNORMAL LOW (ref 135–145)

## 2021-05-27 MED ORDER — NEPRO/CARBSTEADY PO LIQD: 237.0000 mL | Freq: Three times a day (TID) | ORAL | Status: DC

## 2021-05-27 MED ORDER — HYDROMORPHONE HCL 2 MG PO TABS
4.0000 mg | ORAL_TABLET | ORAL | Status: DC | PRN
  Administered 2021-05-27: 4 mg via ORAL
  Filled 2021-05-27: qty 2

## 2021-05-27 MED ORDER — OXYCODONE HCL 5 MG PO TABS: 5.0000 mg | ORAL_TABLET | Freq: Four times a day (QID) | ORAL | 0 refills | Status: DC | PRN

## 2021-05-27 MED ORDER — HYDROMORPHONE HCL 2 MG PO TABS
4.0000 mg | ORAL_TABLET | ORAL | Status: DC | PRN
Start: 2021-05-27 — End: 2021-05-28
  Administered 2021-05-27 (×2): 4 mg via ORAL
  Filled 2021-05-27 (×2): qty 2

## 2021-05-27 MED ORDER — ALUM & MAG HYDROXIDE-SIMETH 200-200-20 MG/5ML PO SUSP
30.0000 mL | Freq: Once | ORAL | Status: AC
  Administered 2021-05-27: 30 mL via ORAL
  Filled 2021-05-27: qty 30

## 2021-05-27 MED ORDER — ACETAMINOPHEN 500 MG PO TABS: 1000.0000 mg | ORAL_TABLET | Freq: Four times a day (QID) | ORAL | 0 refills | Status: DC | PRN

## 2021-05-27 MED ORDER — HYDROMORPHONE HCL 4 MG PO TABS: 4.0000 mg | ORAL_TABLET | ORAL | 0 refills | Status: AC | PRN

## 2021-05-27 MED ORDER — SENNOSIDES-DOCUSATE SODIUM 8.6-50 MG PO TABS
2.0000 | ORAL_TABLET | Freq: Once | ORAL | Status: AC
  Administered 2021-05-27: 2 via ORAL
  Filled 2021-05-27: qty 2

## 2021-05-27 MED ORDER — OXYCODONE HCL 5 MG PO TABS
5.0000 mg | ORAL_TABLET | ORAL | Status: DC | PRN
  Administered 2021-05-27: 10 mg via ORAL
  Filled 2021-05-27: qty 2

## 2021-05-27 MED ORDER — DOXERCALCIFEROL 4 MCG/2ML IV SOLN
INTRAVENOUS | Status: AC
  Filled 2021-05-27: qty 2

## 2021-05-27 MED ORDER — ACETAMINOPHEN 500 MG PO TABS
1000.0000 mg | ORAL_TABLET | Freq: Four times a day (QID) | ORAL | Status: DC
  Administered 2021-05-27 (×3): 1000 mg via ORAL
  Filled 2021-05-27 (×3): qty 2

## 2021-05-27 NOTE — Progress Notes (Signed)
Discharge packet and instructions reviewed with patient and spouse. No questions or concerns at this time. IV removed with catheter intact and dressing applied. Patient was transported via wheelchair and was discharged home with spouse.

## 2021-05-27 NOTE — Discharge Summary (Addendum)
Name: Timothy [name] MRN: [id number] DOB: 07/16/1961 60 y.o. PCP: [name], MD  Date of Admission: 05/19/2021  8:08 PM Date of Discharge:  05/27/21 Attending Physician: Lalla Brothers, MD  DISCHARGE DIAGNOSIS:  Primary Problem: Small bowel obstruction [facility])   Hospital Problems: Principal Problem:   Small bowel obstruction ([place]) Active Problems:   HTN (hypertension)   ESRD on dialysis ([place])    DISCHARGE MEDICATIONS:   Allergies as of 05/27/2021       Reactions   No Known Allergies         Medication List     STOP taking these medications    amLODipine 10 MG tablet Commonly known as: NORVASC   oxyCODONE-acetaminophen 5-325 MG tablet Commonly known as: PERCOCET/ROXICET       TAKE these medications    acetaminophen 500 MG tablet Commonly known as: TYLENOL Take 2 tablets (1,000 mg total) by mouth every 6 (six) hours as needed for mild pain or moderate pain.   albuterol 108 (90 Base) MCG/ACT inhaler Commonly known as: VENTOLIN HFA Inhale 2 puffs into the lungs 3 (three) times daily as needed for shortness of breath.   diltiazem 360 MG 24 hr capsule Commonly known as: CARDIZEM CD Take 360 mg by mouth daily.   HYDROmorphone 4 MG tablet Commonly known as: DILAUDID Take 1 tablet (4 mg total) by mouth every 4 (four) hours as needed for up to 7 days for severe pain (try PO first).   lidocaine-prilocaine cream Commonly known as: EMLA Apply 1 application topically See admin instructions. Tuesday,thursday,Saturday prior to dialysis   sevelamer carbonate 800 MG tablet Commonly known as: RENVELA Take 2 tablets by mouth with breakfast, with lunch, and with evening meal.       ASK your doctor about these medications    docusate sodium 250 MG capsule Commonly known as: COLACE Take 1 capsule (250 mg total) by mouth daily.   lactulose 10 g packet Commonly known as: CEPHULAC Take 1 packet (10 g total) by mouth 3 (three) times daily.                Discharge Care Instructions  (From admission, onward)           Start     Ordered   05/27/21 0000  If the dressing is still on your incision site when you go home, remove it on the third day after your surgery date. Remove dressing if it begins to fall off, or if it is dirty or damaged before the third day.        05/27/21 2017            DISPOSITION AND FOLLOW-UP:  Timothy [name] was discharged from [facility] in Stable condition. At the hospital follow up visit please address:  Follow-up Recommendations: Consults: general surgery  Labs: none Studies: none  Medications: none   Follow-up Appointments:  Follow-up Information     [name], MD. Call in 3 week(s).   Specialty: General Surgery Why: we are working to make this appointment for you. Please call to confirm your appointment information for follow up in 3 weeks Contact information: [address] [place] [address] [phone]         Sur[facility], [place] Follow up.   Specialty: General Surgery Why: for nurse visit staple removal early next week. We are working to make this appointment for you and will call with your appointment information Contact information: [address] [place] [address] [phone]         [name], MD. Schedule an appointment as soon as possible for a visit in 1 week(s).   Specialty: Internal Medicine Contact information: [place] [address] [phone]                 HOSPITAL COURSE:  Patient Summary: Small bowel obstruction Patient presented with abdominal pain and constipation with imaging concerning for SBO. First night, patient unable to tolerate NG tube. He continues to have abdominal discomfort with distension. NG tube re-inserted this morning by surgery with immediate bilious output. Patient c/o on ongoing abdominal pain, distention and constipation,  with persistent SBO on imaging. Tolerated NG tube overnight second night, with continued NG output, net 2000 mL. Denies flatus or BM. He is afebrile. General surgery plans for ex lap today for lysis of adhesions vs small bowel resection. Dilaudid 1.5 mg q4h prn for ongoing abdominal pain.   Ex lap with lysis of adhesions. Lots of adhesions and an internal hernia with small bowel herniated through the mesenteric defect from his right colon seen. The obstruction was clearly coming from the terminal ileum right next to his anastomosis where it appeared that there was a piece of omentum wrapped around this. This was released. General surgery started IV Tylenol/dilaudid 1 mg q2h prn. He had flatus but no BM. Conservative management was continued with NG tube, NPO and pain management. He maintained good NG tube output. Day 2 after surgery, he tolerated NG tube clamping. The next day, he reports significant improvement in his symptoms, reduced abdominal pain, tolerating foods/drinks, and had a BM.   Severe asymptomatic hypertension Patient had left sided throbbing CP that resolved after labetalol with BP >200, trops 180, 197, and 195 on day 2. EKG showed nonspecific T wave changes. Consistent with asymptomatic severe hypertension. Continued to receive IV labetalol q2h prn SBP>180 or DBP>110, then changed to IV hydralazine q4h prn SBP>180 give 10 mg, or SBP>210 give 20 mg per nephrology. After NG tube clamping trial, pt tolerates PO well, and was started on home diltiazem. BP ranged from SBP 160-200 during hospitalization.    ESRD on HD TThSat -HD per nephrology   DISCHARGE INSTRUCTIONS:   Discharge Instructions     Call MD for:  difficulty breathing, headache or visual disturbances   Complete by: As directed    Call MD for:  hives   Complete by: As directed    Call MD for:  persistant dizziness or light-headedness   Complete by: As directed    Call MD for:  persistant nausea and vomiting   Complete by:  As directed    Call MD for:  redness, tenderness, or signs of infection (pain, swelling, redness, odor or green/yellow discharge around incision site)   Complete by: As directed    Call MD for:  severe uncontrolled pain   Complete by: As directed    Call MD for:  temperature >100.4   Complete by: As directed    Diet - low sodium heart healthy   Complete by: As directed    If the dressing is still on your incision site when you go home, remove it on the third day after your surgery date. Remove dressing if it begins to fall off, or if it is dirty or damaged before the third day.   Complete by: As directed    Increase activity slowly   Complete by: As directed  SUBJECTIVE:  No acute overnight events. Patient was seen at bedside during rounds this morning. Pt reports feeling great this AM. Pain at incision sites for which he is taking pain medications. Pt is hungry and requesting to advance diet. Has had multiple bowel movements.   Discharge Vitals:   BP (!) 149/93   Pulse 70   Temp 98.6 F (37 C) (Oral)   Resp 16   Ht 6\' 1"  (1.854 m)   Wt 66.3 kg   SpO2 95%   BMI 19.28 kg/m   OBJECTIVE:   Constitutional: alert, well-appearing, in no acute distress HENT: normocephalic, atraumatic, mucous membranes moist Eyes: conjunctiva non-erythematous, extraocular movements intact Neck: supple Cardiovascular: regular rate and rhythm, no m/r/g Pulmonary/Chest: normal work of breathing on room air, lungs clear to auscultation bilaterally Abdominal: soft, mildly tender to palpation, non-distended MSK: normal bulk and tone Neurological: alert & oriented x 3 Skin: warm and dry Psych: normal behavior, normal affect  Pertinent Labs, Studies, and Procedures:  CBC Latest Ref Rng & Units 05/27/2021 05/27/2021 05/26/2021  WBC 4.0 - 10.5 K/uL 6.8 6.5 9.3  Hemoglobin 13.0 - 17.0 g/dL 9.8(L) 10.2(L) 11.7(L)  Hematocrit 39.0 - 52.0 % 28.4(L) 31.1(L) 34.3(L)  Platelets 150 - 400 K/uL 271 262  238    CMP Latest Ref Rng & Units 05/27/2021 05/24/2021 05/22/2021  Glucose 70 - 99 mg/dL 101(H) 112(H) 65(L)  BUN 6 - 20 mg/dL 86(H) 58(H) 31(H)  Creatinine 0.61 - 1.24 mg/dL 10.48(H) 9.61(H) 7.95(H)  Sodium 135 - 145 mmol/L 130(L) 135 138  Potassium 3.5 - 5.1 mmol/L 4.2 4.8 4.4  Chloride 98 - 111 mmol/L 86(L) 91(L) 94(L)  CO2 22 - 32 mmol/L 25 28 30   Calcium 8.9 - 10.3 mg/dL 8.4(L) 8.1(L) 8.4(L)  Total Protein 6.5 - 8.1 g/dL - - -  Total Bilirubin 0.3 - 1.2 mg/dL - - -  Alkaline Phos 38 - 126 U/L - - -  AST 15 - 41 U/L - - -  ALT 0 - 44 U/L - - -    CT ABDOMEN PELVIS W CONTRAST  Result Date: 05/20/2021 CLINICAL DATA:  60 year old male with history of abdominal distension and periumbilical pain. EXAM: CT ABDOMEN AND PELVIS WITH CONTRAST TECHNIQUE: Multidetector CT imaging of the abdomen and pelvis was performed using the standard protocol following bolus administration of intravenous contrast. CONTRAST:  54mL OMNIPAQUE IOHEXOL 300 MG/ML  SOLN COMPARISON:  CT the abdomen and pelvis 05/19/2019. FINDINGS: Lower chest: Linear areas of scarring are noted in the lung bases bilaterally, most pronounced in the left lower lobe. Cardiomegaly. Atherosclerotic calcifications in the left circumflex and right coronary arteries. Calcifications of the aortic valve. Hepatobiliary: No suspicious cystic or solid hepatic lesions. No intra or extrahepatic biliary ductal dilatation. 1.6 cm predominantly noncalcified gallstone lies dependently in the gallbladder. Gallbladder is only moderately distended without definite wall thickening. Pancreas: No definite pancreatic mass. Pancreatic duct is diffusely dilated measuring up to 8 mm in the pancreatic head and body. No definite peripancreatic fluid collections or inflammatory changes. Spleen: 1.9 x 1.2 cm low-attenuation lesion in the superior aspect of the spleen (axial image 20 of series 3), nonspecific, but favored to represent a cyst. Adrenals/Urinary Tract: Kidneys are  atrophic bilaterally and contain innumerable small low-attenuation lesions, most of which are too small to characterize, but favored to represent cysts. Some other intermediate attenuation lesions are also noted, also generally too small to characterize. In addition, in the anterior aspect of the interpolar region of the right kidney (  axial image 40 of series 3 and coronal image 93 of series 6) there is a 3.7 x 2.9 x 2.6 cm ill-defined mess which is heterogeneous in attenuation with extensive mural thickening (particularly anterolaterally), but new compared to prior examination 05/19/2019, concerning for primary renal neoplasm such as renal cell carcinoma. No hydroureteronephrosis. Urinary bladder is normal in appearance. Stomach/Bowel: The appearance of the stomach is normal. Status post right hemicolectomy. Remaining portions of the colon and rectum appear decompressed. There are multiple dilated loops of small bowel measuring up to 4.5 cm in diameter, with multiple air-fluid levels. An exact transition point is not confidently identified on today's examination. Vascular/Lymphatic: Aortic atherosclerosis, without evidence of aneurysm or dissection in the abdominal or pelvic vasculature. No lymphadenopathy noted in the abdomen or pelvis. Reproductive: Prostate gland and seminal vesicles are unremarkable in appearance. Other: Small volume of ascites. Diffuse mesenteric edema. No pneumoperitoneum. Musculoskeletal: There are no aggressive appearing lytic or blastic lesions noted in the visualized portions of the skeleton. IMPRESSION: 1. Findings are concerning for small bowel obstruction. An exact transition point is not confidently identified on today's examination. 2. Small volume of ascites. 3. 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 This is of uncertain etiology  and significance. 4. Severe atrophy of the kidneys bilaterally with numerous small lesions, most of which are favored to represent a combination of simple and proteinaceous/hemorrhagic cysts. The exception is a new lesion in the anterior aspect of the interpolar region of the right kidney which measures 3.7 x 2.9 x 2.6 cm which is highly concerning for a renal cell carcinoma. Further evaluation with nonemergent abdominal MRI with and without IV gadolinium is recommended in the near future to better evaluate this finding. 5. New small cystic lesion in the superior aspect of the spleen, indeterminate, but favored to be benign. Attention at time of forthcoming MRI examination is recommended. 6. Cholelithiasis without definitive evidence to suggest acute cholecystitis. 7. Aortic atherosclerosis, in addition to least 2 vessel coronary artery disease. Please note that although the presence of coronary artery calcium documents the presence of coronary artery disease, the severity of this disease and any potential stenosis cannot be assessed on this non-gated CT examination. Assessment for potential risk factor modification, dietary therapy or pharmacologic therapy may be warranted, if clinically indicated. 8. There are calcifications of the aortic valve. Echocardiographic correlation for evaluation of potential valvular dysfunction may be warranted if clinically indicated. 9. Additional incidental findings, as above. Electronically Signed   By: Vinnie Langton M.D.   On: 05/20/2021 06:08   DG Abd Portable 1V-Small Bowel Obstruction Protocol-initial, 8 hr delay  Result Date: 05/20/2021 CLINICAL DATA:  Bowel obstruction 8 hour follow-up EXAM: PORTABLE ABDOMEN - 1 VIEW COMPARISON:  05/20/2021, CT 05/20/2021 FINDINGS: Esophageal tube tip overlies proximal stomach. Enteral contrast in the stomach. Dilute contrast visible within dilated small bowel. No definite colon contrast is visualized. IMPRESSION: 1. Small amount of  dilute contrast within dilated small bowel with no visible colon contrast. 2. No significant change in small bowel obstruction pattern. Electronically Signed   By: Donavan Foil M.D.   On: 05/20/2021 18:22   DG Abd Portable 1V  Result Date: 05/20/2021 CLINICAL DATA:  Document NG tube placement. EXAM: PORTABLE ABDOMEN - 1 VIEW COMPARISON:  Abdominal plain film May 20, 2021. CT of the abdomen and pelvis May 20, 2021. FINDINGS: NG tube in stable  position. The side port approximately 4 cm above the GE junction. Small amount of contrast is present in the stomach. Persistent small bowel distension is noted throughout the abdomen compatible with small bowel obstruction. Visualized lung bases are clear. Postoperative changes in the RIGHT upper quadrant from cholecystectomy as before. On limited assessment there is no acute skeletal process. IMPRESSION: NG tube in stable position, side port is approximately 4 cm above the GE junction. Consider advancement of 6-8 cm for more optimal placement. Electronically Signed   By: Zetta Bills M.D.   On: 05/20/2021 14:54   DG Abd Portable 1V-Small Bowel Protocol-Position Verification  Result Date: 05/20/2021 CLINICAL DATA:  In G-tube placed EXAM: PORTABLE ABDOMEN - 1 VIEW COMPARISON:  None. FINDINGS: Multiple dilated loops of small bowel. Nasogastric tube side port overlies the distal esophagus. No acute osseous abnormality. IMPRESSION: Nasogastric tube side port overlies the distal esophagus. Recommend advancement by 5.0 cm. Small bowel obstruction. These results will be called to the ordering clinician or representative by the Radiologist Assistant, and communication documented in the PACS or [employer]. Electronically Signed   By: Maurine Simmering   On: 05/20/2021 09:34     Signed: Lajean Manes, MD Internal Medicine Resident, PGY-1 [facility]  Pager: [phone] 8:20 PM, 05/27/2021

## 2021-05-27 NOTE — Progress Notes (Addendum)
Progress Note  4 Days Post-Op  Subjective: Having bowel function - passing flatus and BM - and tolerated full liquids without nausea or emesis.  Objective: Vital signs in last 24 hours: Temp:  [97.6 F (36.4 C)-98.7 F (37.1 C)] 98.1 F (36.7 C) (08/12 0739) Pulse Rate:  [72-85] 72 (08/12 0739) Resp:  [16-20] 19 (08/12 0739) BP: (130-170)/(83-114) 141/83 (08/12 0739) SpO2:  [94 %-99 %] 97 % (08/12 0739) Last BM Date: 05/26/21  Intake/Output from previous day: 08/11 0701 - 08/12 0700 In: 370 [P.O.:220; IV Piggyback:150] Out: -  Intake/Output this shift: No intake/output data recorded.  PE: General: sitting in chair in NAD Lungs: Respiratory effort nonlabored on room air Abd: soft, +BS. mild appropriate tenderness to palpation. Staples intact with. No erythema or discharge Skin: warm and dry with no masses, lesions, or rashes Psych: A&Ox3 with an appropriate affect.    Lab Results:  Recent Labs    05/26/21 0311 05/27/21 0041  WBC 9.3 6.5  HGB 11.7* 10.2*  HCT 34.3* 31.1*  PLT 238 262   BMET Recent Labs    05/24/21 0908  NA 135  K 4.8  CL 91*  CO2 28  GLUCOSE 112*  BUN 58*  CREATININE 9.61*  CALCIUM 8.1*   PT/INR No results for input(s): LABPROT, INR in the last 72 hours. CMP     Component Value Date/Time   NA 135 05/24/2021 0908   K 4.8 05/24/2021 0908   CL 91 (L) 05/24/2021 0908   CO2 28 05/24/2021 0908   GLUCOSE 112 (H) 05/24/2021 0908   BUN 58 (H) 05/24/2021 0908   CREATININE 9.61 (H) 05/24/2021 0908   CREATININE 1.59 (H) 07/29/2015 0943   CALCIUM 8.1 (L) 05/24/2021 0908   CALCIUM 8.4 (L) 09/14/2016 1615   PROT 6.7 05/21/2021 0811   ALBUMIN 3.2 (L) 05/24/2021 0908   AST 18 05/21/2021 0811   ALT 9 05/21/2021 0811   ALKPHOS 64 05/21/2021 0811   BILITOT 1.1 05/21/2021 0811   GFRNONAA 6 (L) 05/24/2021 0908   GFRNONAA 48 (L) 07/29/2015 0943   GFRAA 7 (L) 05/18/2019 2220   GFRAA 56 (L) 07/29/2015 0943   Lipase     Component Value  Date/Time   LIPASE 40 05/19/2021 2049       Studies/Results: No results found.  Anti-infectives: Anti-infectives (From admission, onward)    Start     Dose/Rate Route Frequency Ordered Stop   05/23/21 1045  ceFAZolin (ANCEF) IVPB 2g/100 mL premix  Status:  Discontinued        2 g 200 mL/hr over 30 Minutes Intravenous On call to O.R. 05/23/21 0952 05/23/21 1851        Assessment/Plan  Small bowel obstruction S/p right hemicolectomy Exploratory laparotomy lysis of adhesions, 05/23/2021 Dr. [name] POD #4  - BM yesterday - tolerated fulls. advanced to renal diet - stable for discharge from a surgical perspective - patient with prescription for monthly oxycodone - will send short course of additional oxycodone for break through pain control in setting of abdominal surgery   FEN: renal diet, dietary supplements ID: Ancef preop DVT: Heparin Follow-up: Dr. Donne Hazel   End-stage renal disease on dialysis Tuesday Thursday Saturday Anemia Secondary hyperparathyroidism Hypertension lesion right kidney possible renal neoplasm Cholelithiasis  moderate malnutrition - prealbumin 14.5 Left perirectal skin break down - WOCN. clean with soap and water.  Redress with protective dressing    LOS: 6 days    Winferd Humphrey, [facility] 05/27/2021,  8:54 AM Please see Amion for pager number during day hours 7:00am-4:30pm

## 2021-05-27 NOTE — Discharge Instructions (Addendum)
[facility]      [facility] [phone]  OPEN ABDOMINAL SURGERY: POST OP INSTRUCTIONS  Always review your discharge instruction sheet given to you by the facility where your surgery was performed.  IF YOU HAVE DISABILITY OR FAMILY LEAVE FORMS, YOU MUST BRING THEM TO THE OFFICE FOR PROCESSING.  PLEASE DO NOT GIVE THEM TO YOUR DOCTOR.  A prescription for pain medication may be given to you upon discharge.  Take your pain medication as prescribed, if needed.  If narcotic pain medicine is not needed, then you may take acetaminophen (Tylenol) or ibuprofen (Advil) as needed. Take your usually prescribed medications unless otherwise directed. If you need a refill on your pain medication, please contact your pharmacy. They will contact our office to request authorization.  Prescriptions will not be filled after 5pm or on week-ends. You should follow a light diet the first few days after arrival home, such as soup and crackers, pudding, etc.unless your doctor has advised otherwise. A high-fiber, low fat diet can be resumed as tolerated.   Be sure to include lots of fluids daily. Most patients will experience some swelling and bruising on the chest and neck area.  Ice packs will help.  Swelling and bruising can take several days to resolve Most patients will experience some swelling and bruising in the area of the incision. Ice pack will help. Swelling and bruising can take several days to resolve..  It is common to experience some constipation if taking pain medication after surgery.  Increasing fluid intake and taking a stool softener will usually help or prevent this problem from occurring.  A mild laxative (Milk of Magnesia or Miralax) should be taken according to package directions if there are no bowel movements after 48 hours.  You may have steri-strips (small skin tapes) in place directly over the incision.  These strips should be left on the skin for 7-10 days.  If your surgeon used skin  glue on the incision, you may shower in 24 hours.  The glue will flake off over the next 2-3 weeks.  Any sutures or staples will be removed at the office during your follow-up visit. You may find that a light gauze bandage over your incision may keep your staples from being rubbed or pulled. You may shower and replace the bandage daily. ACTIVITIES:  You may resume regular (light) daily activities beginning the next day--such as daily self-care, walking, climbing stairs--gradually increasing activities as tolerated.  You may have sexual intercourse when it is comfortable.  Refrain from any heavy lifting or straining until approved by your doctor. You may drive when you no longer are taking prescription pain medication, you can comfortably wear a seatbelt, and you can safely maneuver your car and apply brakes Return to Work: ___________________________________ You should see your doctor in the office for a follow-up appointment approximately two weeks after your surgery.  Make sure that you call for this appointment within a day or two after you arrive home to insure a convenient appointment time. OTHER INSTRUCTIONS:  _____________________________________________________________ _____________________________________________________________  WHEN TO CALL YOUR DOCTOR: Fever over 101.0 Inability to urinate Nausea and/or vomiting Extreme swelling or bruising Continued bleeding from incision. Increased pain, redness, or drainage from the incision. Difficulty swallowing or breathing Muscle cramping or spasms. Numbness or tingling in hands or feet or around lips.  The clinic staff is available to answer your questions during regular business hours.  Please don't hesitate to call and ask to speak to one of   the nurses if you have concerns.  For further questions, please visit [email or web address]    Please make a follow up appointment with your PCP to discuss ways to prevent small bowel  obstruction from occurring in the near future and to assess for risk modifications.

## 2021-05-27 NOTE — Progress Notes (Signed)
Nutrition Follow-up  DOCUMENTATION CODES:   Underweight, Severe malnutrition in context of chronic illness  INTERVENTION:   -Continue 30 ml Prosource Plus TID< each supplement provides 100 kcals and 15 grams protein -Continue renal MVI daily -D/c Boost Breeze -Nepro Shake po TID, each supplement provides 425 kcal and 19 grams protein   NUTRITION DIAGNOSIS:   Severe Malnutrition related to chronic illness (ESRD on HD) as evidenced by severe fat depletion, moderate fat depletion, moderate muscle depletion, severe muscle depletion.  Ongoing  GOAL:   Patient will meet greater than or equal to 90% of their needs  Progressing   MONITOR:   PO intake, Supplement acceptance, Diet advancement, Labs, Skin, Weight trends, I & O's  REASON FOR ASSESSMENT:   NPO/Clear Liquid Diet, Consult Assessment of nutrition requirement/status  ASSESSMENT:   Timothy [name] is a 60 year old male with a past medical history of colon cancer status post resection and ESRD on HD, hypertension presenting with abdominal pain and constipation currently admitted for small bowel obstruction.  8/6- NGT removed per pt request 8/7- NGT replaced 8/8- s/p Procedure: Exploratory laparotomy with lysis of adhesions 8/10- NGT removed, tolerated clamping trials 8/11- advanced to clear liquid diet, advanced to clear liquid diet 8/12- advanced to renal diet  Reviewed I/O's: +370 ml x 24 hours and -1.4 L since admission  Pt unavailable at time of visit.   Per general surgery notes, pt tolerated full liquids without nausea and vomiting. Noted meal completion 25-50%. Pt is taking Prosource and Boost Breeze supplements.   Medications reviewed and include maalox and cardizem.  Labs reviewed.   Diet Order:   Diet Order             Diet renal with fluid restriction Fluid restriction: 1200 mL Fluid; Room service appropriate? Yes; Fluid consistency: Thin  Diet effective now                   EDUCATION NEEDS:    No education needs have been identified at this time  Skin:  Skin Assessment: Skin Integrity Issues: Skin Integrity Issues:: Incisions Incisions: closed abdomen, lt buttocks  Last BM:  05/26/21  Height:   Ht Readings from Last 1 Encounters:  05/19/21 6\' 1"  (1.854 m)    Weight:   Wt Readings from Last 1 Encounters:  05/24/21 61.7 kg    Ideal Body Weight:  83.6 kg  BMI:  Body mass index is 17.95 kg/m.  Estimated Nutritional Needs:   Kcal:  8341-9622  Protein:  125-150 grams  Fluid:  1000 ml + UOP    Loistine Chance, RD, LDN, [place] Registered Dietitian II Certified Diabetes Care and Education Specialist Please refer to [facility] for RD and/or RD on-call/weekend/after hours pager

## 2021-05-27 NOTE — Progress Notes (Signed)
[facility] Progress Note   Subjective:    Seen and examined patient at bedside. Patient reports abdominal bloating with mild SOB. Denies CP, ABD pain, and N/V. Plan for HD today for UFG 1L as tolerated.  Objective Vitals:   05/27/21 0158 05/27/21 0527 05/27/21 0739 05/27/21 1218  BP: (!) 141/85 (!) 158/114 (!) 141/83 (!) 143/82  Pulse:  78 72 73  Resp: 16 20 19 16   Temp:  98.7 F (37.1 C) 98.1 F (36.7 C) 97.7 F (36.5 C)  TempSrc:  Axillary Oral Oral  SpO2: 96% 95% 97% 96%  Weight:      Height:       Physical Exam General: Chronically ill-appearing male; NAD Heart: Normal S1 and S2; No murmurs, gallops, or rubs Lungs: Clear throughout; No wheezing, rales, or rhonchi Abdomen: ABD distended, soft. Noted mid-abdominal incision with staples-clean, dry, and intact. Extremities: No edema BLLE Dialysis Access: L AVF (+) Bruit/Thrill   Filed Weights   05/19/21 2011 05/21/21 2015 05/24/21 0845  Weight: 70.2 kg 66.3 kg 61.7 kg    Intake/Output Summary (Last 24 hours) at 05/27/2021 1621 Last data filed at 05/27/2021 0800 Gross per 24 hour  Intake 0 ml  Output --  Net 0 ml    Additional Objective Labs: Basic Metabolic Panel: Recent Labs  Lab 05/22/21 0303 05/22/21 2352 05/24/21 0908  NA 136 138 135  K 4.0 4.4 4.8  CL 97* 94* 91*  CO2 29 30 28   GLUCOSE 75 65* 112*  BUN 18 31* 58*  CREATININE 5.33* 7.95* 9.61*  CALCIUM 8.0* 8.4* 8.1*  PHOS 3.7 6.2* 9.5*   Liver Function Tests: Recent Labs  Lab 05/21/21 0811 05/22/21 0303 05/22/21 2352 05/24/21 0908  AST 18  --   --   --   ALT 9  --   --   --   ALKPHOS 64  --   --   --   BILITOT 1.1  --   --   --   PROT 6.7  --   --   --   ALBUMIN 3.3* 3.2* 3.2* 3.2*   No results for input(s): LIPASE, AMYLASE in the last 168 hours. CBC: Recent Labs  Lab 05/24/21 0107 05/24/21 0908 05/25/21 0220 05/26/21 0311 05/27/21 0041  WBC 9.6 6.6 7.1 9.3 6.5  HGB 10.2* 9.8* 11.0* 11.7* 10.2*  HCT 32.3* 30.6*  33.6* 34.3* 31.1*  MCV 95.3 92.7 92.3 90.0 90.4  PLT 246 227 221 238 262   Blood Culture    Component Value Date/Time   SDES BLOOD RIGHT HAND 10/26/2016 1203   SPECREQUEST BOTTLES DRAWN AEROBIC AND ANAEROBIC  5CC 10/26/2016 1203   CULT NO GROWTH 5 DAYS 10/26/2016 1203   REPTSTATUS 10/31/2016 FINAL 10/26/2016 1203    Cardiac Enzymes: No results for input(s): CKTOTAL, CKMB, CKMBINDEX, TROPONINI in the last 168 hours. CBG: No results for input(s): GLUCAP in the last 168 hours. Iron Studies: No results for input(s): IRON, TIBC, TRANSFERRIN, FERRITIN in the last 72 hours. Lab Results  Component Value Date   INR 1.11 01/15/2018   INR 1.02 01/13/2018   Studies/Results: No results found.  Medications:   (feeding supplement) PROSource Plus  30 mL Oral TID BM   acetaminophen  1,000 mg Oral Q6H   Chlorhexidine Gluconate Cloth  6 each Topical Q0600   diltiazem  360 mg Oral Daily   doxercalciferol  2 mcg Intravenous Q T,Th,Sa-HD   feeding supplement (NEPRO CARB STEADY)  237 mL Oral TID BM  heparin  5,000 Units Subcutaneous Q8H   multivitamin  1 tablet Oral QHS    Dialysis Orders: [place] T,Th,S 3:45 hrs 450/Autoflow 1.5 66 kg 2.0K/2.0 Ca UFP 4 AVF -No heparin -Venofer 100 mg IV X 10 doses (3/10 doses given) -Mircera 30 mcg IV q 2 weeks (last dose 05/12/2021)  Assessment/Plan: 1. SBO-persistent SBO failing conservative tx. To OR 05/23/21 per [facility] for exploratory lap/R Hemicolectomy.   2. ESRD -T,Th,S next HD today 05/27/2021 3. Anemia - HGB 10.2. Follow HGB. Hold Fe load.  4. Secondary hyperparathyroidism - Binders on hold D/T SBO. Continue VDRA 5. HTN/volume -Cramped miserably in HD previously-signed off early DT cramping & pain. Very much under EDW. Hypertensive previously-believe pain is contributing to elevated BP. IV hydralazine 10-20 mg IV q 4 hours PRN SBP >180 added, blood pressures slowly improving-monitor.  6. Nutrition - NPO 7. Lesion R Kidney-possible RCC. Recommending  OP FU with MRI.    Tobie Poet, NP [facility] 05/27/2021,4:21 PM  LOS: 6 days

## 2021-05-29 NOTE — TOC Transition Note (Signed)
Transition of care contact from inpatient facility  Date of discharge: 05/27/21 Date of contact: 05/29/21 Method: Attempted Phone Call Spoke to: No Answer  Attempted to call Mr. Paris to discuss recent hospitalization but patient did not pick up the phone. A voicemail was left with callback number: [phone] if he has any questions/concerns.   Plan for renal team to f/u with him at next scheduled HD on 05/31/21 at [facility].  Tobie Poet, NP

## 2021-06-01 DIAGNOSIS — J45909 Unspecified asthma, uncomplicated: Secondary | ICD-10-CM | POA: Insufficient documentation

## 2021-06-01 DIAGNOSIS — I509 Heart failure, unspecified: Secondary | ICD-10-CM | POA: Insufficient documentation

## 2021-06-01 DIAGNOSIS — Z859 Personal history of malignant neoplasm, unspecified: Secondary | ICD-10-CM | POA: Insufficient documentation

## 2021-06-11 ENCOUNTER — Encounter (HOSPITAL_COMMUNITY): Payer: Self-pay | Admitting: Emergency Medicine

## 2021-06-11 ENCOUNTER — Emergency Department (HOSPITAL_COMMUNITY): Payer: Medicare HMO

## 2021-06-11 ENCOUNTER — Other Ambulatory Visit: Payer: Self-pay

## 2021-06-11 ENCOUNTER — Inpatient Hospital Stay (HOSPITAL_COMMUNITY)
Admission: EM | Admit: 2021-06-11 | Discharge: 2021-06-25 | DRG: 388 | Disposition: A | Discharge status: Home / Self Care | Payer: Medicare HMO | Attending: Internal Medicine | Admitting: Internal Medicine

## 2021-06-11 DIAGNOSIS — Z841 Family history of disorders of kidney and ureter: Secondary | ICD-10-CM

## 2021-06-11 DIAGNOSIS — K567 Ileus, unspecified: Secondary | ICD-10-CM | POA: Diagnosis not present

## 2021-06-11 DIAGNOSIS — G8918 Other acute postprocedural pain: Secondary | ICD-10-CM

## 2021-06-11 DIAGNOSIS — N186 End stage renal disease: Secondary | ICD-10-CM

## 2021-06-11 DIAGNOSIS — K648 Other hemorrhoids: Secondary | ICD-10-CM | POA: Diagnosis present

## 2021-06-11 DIAGNOSIS — R161 Splenomegaly, not elsewhere classified: Secondary | ICD-10-CM

## 2021-06-11 DIAGNOSIS — Z599 Problem related to housing and economic circumstances, unspecified: Secondary | ICD-10-CM

## 2021-06-11 DIAGNOSIS — K8020 Calculus of gallbladder without cholecystitis without obstruction: Secondary | ICD-10-CM | POA: Diagnosis present

## 2021-06-11 DIAGNOSIS — Z6820 Body mass index (BMI) 20.0-20.9, adult: Secondary | ICD-10-CM

## 2021-06-11 DIAGNOSIS — R64 Cachexia: Secondary | ICD-10-CM | POA: Diagnosis present

## 2021-06-11 DIAGNOSIS — Z20822 Contact with and (suspected) exposure to covid-19: Secondary | ICD-10-CM | POA: Diagnosis present

## 2021-06-11 DIAGNOSIS — I50810 Right heart failure, unspecified: Secondary | ICD-10-CM | POA: Diagnosis present

## 2021-06-11 DIAGNOSIS — Z9049 Acquired absence of other specified parts of digestive tract: Secondary | ICD-10-CM

## 2021-06-11 DIAGNOSIS — I132 Hypertensive heart and chronic kidney disease with heart failure and with stage 5 chronic kidney disease, or end stage renal disease: Secondary | ICD-10-CM | POA: Diagnosis present

## 2021-06-11 DIAGNOSIS — R188 Other ascites: Secondary | ICD-10-CM | POA: Diagnosis present

## 2021-06-11 DIAGNOSIS — D631 Anemia in chronic kidney disease: Secondary | ICD-10-CM | POA: Diagnosis present

## 2021-06-11 DIAGNOSIS — Z992 Dependence on renal dialysis: Secondary | ICD-10-CM

## 2021-06-11 DIAGNOSIS — Z79899 Other long term (current) drug therapy: Secondary | ICD-10-CM

## 2021-06-11 DIAGNOSIS — E43 Unspecified severe protein-calorie malnutrition: Secondary | ICD-10-CM | POA: Diagnosis present

## 2021-06-11 DIAGNOSIS — Z833 Family history of diabetes mellitus: Secondary | ICD-10-CM

## 2021-06-11 DIAGNOSIS — B9620 Unspecified Escherichia coli [E. coli] as the cause of diseases classified elsewhere: Secondary | ICD-10-CM | POA: Diagnosis present

## 2021-06-11 DIAGNOSIS — Z23 Encounter for immunization: Secondary | ICD-10-CM

## 2021-06-11 DIAGNOSIS — Z87891 Personal history of nicotine dependence: Secondary | ICD-10-CM

## 2021-06-11 DIAGNOSIS — N401 Enlarged prostate with lower urinary tract symptoms: Secondary | ICD-10-CM | POA: Diagnosis present

## 2021-06-11 DIAGNOSIS — Z8619 Personal history of other infectious and parasitic diseases: Secondary | ICD-10-CM

## 2021-06-11 DIAGNOSIS — Z8249 Family history of ischemic heart disease and other diseases of the circulatory system: Secondary | ICD-10-CM

## 2021-06-11 DIAGNOSIS — R109 Unspecified abdominal pain: Secondary | ICD-10-CM

## 2021-06-11 DIAGNOSIS — K5900 Constipation, unspecified: Secondary | ICD-10-CM | POA: Diagnosis present

## 2021-06-11 DIAGNOSIS — N2889 Other specified disorders of kidney and ureter: Secondary | ICD-10-CM | POA: Diagnosis present

## 2021-06-11 DIAGNOSIS — R45851 Suicidal ideations: Secondary | ICD-10-CM | POA: Diagnosis not present

## 2021-06-11 DIAGNOSIS — F322 Major depressive disorder, single episode, severe without psychotic features: Secondary | ICD-10-CM | POA: Diagnosis present

## 2021-06-11 DIAGNOSIS — Z9119 Patient's noncompliance with other medical treatment and regimen: Secondary | ICD-10-CM

## 2021-06-11 DIAGNOSIS — E871 Hypo-osmolality and hyponatremia: Secondary | ICD-10-CM | POA: Diagnosis not present

## 2021-06-11 DIAGNOSIS — I272 Pulmonary hypertension, unspecified: Secondary | ICD-10-CM | POA: Diagnosis present

## 2021-06-11 DIAGNOSIS — R338 Other retention of urine: Secondary | ICD-10-CM | POA: Diagnosis present

## 2021-06-11 DIAGNOSIS — I5032 Chronic diastolic (congestive) heart failure: Secondary | ICD-10-CM | POA: Diagnosis present

## 2021-06-11 DIAGNOSIS — E8809 Other disorders of plasma-protein metabolism, not elsewhere classified: Secondary | ICD-10-CM | POA: Diagnosis present

## 2021-06-11 DIAGNOSIS — F17210 Nicotine dependence, cigarettes, uncomplicated: Secondary | ICD-10-CM

## 2021-06-11 DIAGNOSIS — N2581 Secondary hyperparathyroidism of renal origin: Secondary | ICD-10-CM | POA: Diagnosis present

## 2021-06-11 DIAGNOSIS — Z85038 Personal history of other malignant neoplasm of large intestine: Secondary | ICD-10-CM

## 2021-06-11 DIAGNOSIS — K659 Peritonitis, unspecified: Secondary | ICD-10-CM | POA: Diagnosis present

## 2021-06-11 LAB — COMPREHENSIVE METABOLIC PANEL
ALT: 9 U/L (ref 0–44)
AST: 18 U/L (ref 15–41)
Albumin: 2.4 g/dL — ABNORMAL LOW (ref 3.5–5.0)
Alkaline Phosphatase: 59 U/L (ref 38–126)
Anion gap: 11 (ref 5–15)
BUN: 18 mg/dL (ref 6–20)
CO2: 33 mmol/L — ABNORMAL HIGH (ref 22–32)
Calcium: 8.6 mg/dL — ABNORMAL LOW (ref 8.9–10.3)
Chloride: 93 mmol/L — ABNORMAL LOW (ref 98–111)
Creatinine, Ser: 3.93 mg/dL — ABNORMAL HIGH (ref 0.61–1.24)
GFR, Estimated: 17 mL/min — ABNORMAL LOW (ref >60)
Glucose, Bld: 116 mg/dL — ABNORMAL HIGH (ref 70–99)
Potassium: 3.7 mmol/L (ref 3.5–5.1)
Sodium: 137 mmol/L (ref 135–145)
Total Bilirubin: 0.6 mg/dL (ref 0.3–1.2)
Total Protein: 6.4 g/dL — ABNORMAL LOW (ref 6.5–8.1)

## 2021-06-11 LAB — CBC WITH DIFFERENTIAL/PLATELET
Abs Immature Granulocytes: 0.11 10*3/uL — ABNORMAL HIGH (ref 0.00–0.07)
Basophils Absolute: 0 10*3/uL (ref 0.0–0.1)
Basophils Relative: 0 %
Eosinophils Absolute: 0 10*3/uL (ref 0.0–0.5)
Eosinophils Relative: 0 %
HCT: 31.4 % — ABNORMAL LOW (ref 39.0–52.0)
Hemoglobin: 10.5 g/dL — ABNORMAL LOW (ref 13.0–17.0)
Immature Granulocytes: 1 %
Lymphocytes Relative: 4 %
Lymphs Abs: 0.5 10*3/uL — ABNORMAL LOW (ref 0.7–4.0)
MCH: 29.8 pg (ref 26.0–34.0)
MCHC: 33.4 g/dL (ref 30.0–36.0)
MCV: 89.2 fL (ref 80.0–100.0)
Monocytes Absolute: 1.5 10*3/uL — ABNORMAL HIGH (ref 0.1–1.0)
Monocytes Relative: 12 %
Neutro Abs: 10.1 10*3/uL — ABNORMAL HIGH (ref 1.7–7.7)
Neutrophils Relative %: 83 %
Platelets: 342 10*3/uL (ref 150–400)
RBC: 3.52 MIL/uL — ABNORMAL LOW (ref 4.22–5.81)
RDW: 14.1 % (ref 11.5–15.5)
WBC: 12.3 10*3/uL — ABNORMAL HIGH (ref 4.0–10.5)
nRBC: 0 % (ref 0.0–0.2)

## 2021-06-11 LAB — LACTIC ACID, PLASMA: Lactic Acid, Venous: 1.4 mmol/L (ref 0.5–1.9)

## 2021-06-11 LAB — LIPASE, BLOOD: Lipase: 50 U/L (ref 11–51)

## 2021-06-11 MED ORDER — ONDANSETRON 4 MG PO TBDP
4.0000 mg | ORAL_TABLET | Freq: Once | ORAL | Status: AC
  Administered 2021-06-11: 4 mg via ORAL
  Filled 2021-06-11: qty 1

## 2021-06-11 MED ORDER — HYDROMORPHONE HCL 1 MG/ML IJ SOLN
1.0000 mg | Freq: Once | INTRAMUSCULAR | Status: AC
  Administered 2021-06-11: 1 mg via INTRAVENOUS
  Filled 2021-06-11: qty 1

## 2021-06-11 MED ORDER — ACETAMINOPHEN 325 MG PO TABS
650.0000 mg | ORAL_TABLET | Freq: Four times a day (QID) | ORAL | Status: DC | PRN
  Administered 2021-06-11 (×2): 650 mg via ORAL
  Filled 2021-06-11 (×2): qty 2

## 2021-06-11 MED ORDER — ENOXAPARIN SODIUM 30 MG/0.3ML IJ SOSY
30.0000 mg | PREFILLED_SYRINGE | INTRAMUSCULAR | Status: DC
  Administered 2021-06-12 – 2021-06-24 (×11): 30 mg via SUBCUTANEOUS
  Filled 2021-06-11 (×13): qty 0.3

## 2021-06-11 MED ORDER — IOHEXOL 350 MG/ML SOLN
75.0000 mL | Freq: Once | INTRAVENOUS | Status: AC | PRN
  Administered 2021-06-11: 75 mL via INTRAVENOUS

## 2021-06-11 MED ORDER — LACTATED RINGERS IV BOLUS
500.0000 mL | Freq: Once | INTRAVENOUS | Status: AC
  Administered 2021-06-11: 500 mL via INTRAVENOUS

## 2021-06-11 NOTE — H&P (Signed)
Date: 06/11/2021               Patient Name:  Timothy [name] MRN: [id number]  DOB: 08/17/1961 Age / Sex: 60 y.o., male   PCP: [name], MD         Medical Service: Internal Medicine Teaching Service         Attending Physician: Dr. Heber Whitehall, Rachel Moulds, DO    First Contact: Lajean Manes, MD Pager: MP [phone]  Second Contact: Virl Axe MD Pager: [phone]       After Hours (After 5p/  First Contact Pager: [phone]  weekends / holidays): Second Contact Pager: [phone]   SUBJECTIVE   Chief Complaint: Constipation, Abd Pain/Swelling  History of Present Illness:   Mr. Timothy [name] is a 60 year old gentleman with a past medical history of ESRD on hemodialysis (Tu,Thur,Sat) today, recent admission for small bowel obstruction status post ex lap with lysis of adhesions, hypertension, right renal mass who presented to the ED after constipation and abdominal pain and swelling for the past few days.  He is passing gas, but denies bowel movement in the past 3-4 days. Last BM was loose with bright red blood. He notes the bloody stools have beee occurring since he left the hospital. Had episodes of dry heaves, but denies vomiting. He notes his last meal was last week and hasn't eaten since. Has been tolerating ice chips. Endorses right sided abdominal pain. Last urinated prior to his arrival in the ED, brown in color however he is ESRD. Productive cough with brown color since last admission.   Denies fever, chills, shortness of breaths, chest pain. Denies night sweats. Has had weight loss over the past few weeks.   ED Course:  Abdominal pain work-up was initiated in the emergency department.  Patient with mild leukocytosis of 12.3.  Hemoglobin is 10.5.  No lactic acidosis.  CT abdomen showed large volume ascites as well as single loop of mildly patulous large bowel but no focal transition point to suggest mechanical obstruction.  Did have air-fluid levels concerning for ileus.  Due to patient not  tolerating p.o.'s and his ileus, general surgery was consulted who recommended to keep the patient n.p.o. and give IV fluids as needed.  Patient not vomiting so did not need NG tube.  It was initiated discussed with surgery who recommended patient remain NPO and IV fluids. NG tube if patient has episode of vomiting.   Meds:  Cardizem 360 mg daily Renvela 800 mg two tabs with meals  Past Medical History:  Diagnosis Date   Anemia of chronic kidney failure    BPH (benign prostatic hyperplasia)    Colon cancer ([place]) 2014   End stage renal disease on dialysis ([place]) 2017   Hypertension    Hypertensive heart disease with chronic diastolic congestive heart failure ([place]) 07/17/2016   Polysubstance abuse ([place])    History of heroin and marijuana use    Past Surgical History:  Procedure Laterality Date   ABDOMINAL SURGERY     AV FISTULA PLACEMENT Left 09/19/2016   Procedure: Left arm Radiocephalic ARTERIOVENOUS (AV) FISTULA CREATION;  Surgeon: Conrad [place], MD;  Location: [place];  Service: Vascular;  Laterality: Left;   [place] Left 07/09/2017   Procedure: BRACHIOCEPHALIC FISTULA CREATION;  Surgeon: Conrad [place], MD;  Location: [place];  Service: Vascular;  Laterality: Left;   COLON SURGERY  2014   INSERTION OF DIALYSIS CATHETER N/A 09/19/2016   Procedure: INSERTION OF TUNNELED DIALYSIS CATHETER;  Surgeon: Aaron Edelman  Starlyn Skeans, MD;  Location: [facility];  Service: Vascular;  Laterality: N/A;   IR GENERIC HISTORICAL  09/14/2016   IR US GUIDE VASC ACCESS RIGHT 09/14/2016 [name], DO [facility]INTER[facility] RAD   IR GENERIC HISTORICAL  09/14/2016   IR FLUORO GUIDE CV LINE RIGHT 09/14/2016 [name], DO [facility]   LAPAROTOMY N/A 05/23/2021   Procedure: EXPLORATORY LAPAROTOMY LYSIS ADHESIONS;  Surgeon: [name], MD;  Location: [place];  Service: General;  Laterality: N/A;   ORIF TIBIA PLATEAU Left 01/15/2018   Procedure: OPEN REDUCTION INTERNAL FIXATION (ORIF) TIBIAL PLATEAU;  Surgeon: Altamese [place], MD;  Location: [place];  Service: Orthopedics;  Laterality: Left;   TRANSTHORACIC ECHOCARDIOGRAM  07/2016    EF 60-65%, No RWMA. Mod Concentric LVH - Gr 2 DD. Severe LA dilation. PAP ~35 mmHg (mild Pulm HTN)  --> no changes noted 1 month later    Social:  Lives With: Wife and Kids Occupation: Does not work at this time Support: Family at home Level of Function: able to perform ADL's PCP: Dr.Osei-Bonsu Substances: 15 yrs 1PPD quit 3 weeks ago, 2-3 pints of liquor per day stopped drinking in 8416 , Illicit Substances  Family History: Family History  Problem Relation Age of Onset   Heart failure Mother        Died at age 79.   Heart attack Mother 49   Hypertension Mother    Diabetes Mellitus II Mother    Other Father        Unknown   Kidney failure Sister        (Oldest Sister)   Other Other        Multiple siblings have started her heart disease, he is not sure of the details.   CAD Nephew    Allergies: Allergies as of 06/11/2021 - Review Complete 06/11/2021  Allergen Reaction Noted   No known allergies  09/18/2016   Review of Systems: A complete ROS was negative except as per HPI.   OBJECTIVE:   Physical Exam: Blood pressure (!) 163/106, pulse 78, temperature 98.5 F (36.9 C), temperature source Oral, resp. rate 11, SpO2 97 %.  Constitutional: Alert, well-appearing, no acute distress HENT: normocephalic atraumatic Eyes: conjunctiva non-erythematous Neck: supple Cardiovascular: regular rate and rhythm, no m/r/g Pulmonary/Chest: normal work of breathing on room air, lungs clear to auscultation bilaterally Abdominal: Soft, distended, generalized tenderness.  Well-healed midline vertical incision.  Bowel sounds present MSK: normal bulk and tone.  Positive bruit in left upper arm AV fistula Neurological: alert & oriented x 3 Skin: warm and dry Psych: Normal mood and thought process  Labs: CBC    Component Value Date/Time   WBC 12.3 (H) 06/11/2021 1427   RBC  3.52 (L) 06/11/2021 1427   HGB 10.5 (L) 06/11/2021 1427   HCT 31.4 (L) 06/11/2021 1427   PLT 342 06/11/2021 1427   MCV 89.2 06/11/2021 1427   MCH 29.8 06/11/2021 1427   MCHC 33.4 06/11/2021 1427   RDW 14.1 06/11/2021 1427   LYMPHSABS 0.5 (L) 06/11/2021 1427   MONOABS 1.5 (H) 06/11/2021 1427   EOSABS 0.0 06/11/2021 1427   BASOSABS 0.0 06/11/2021 1427     CMP     Component Value Date/Time   NA 137 06/11/2021 1427   K 3.7 06/11/2021 1427   CL 93 (L) 06/11/2021 1427   CO2 33 (H) 06/11/2021 1427   GLUCOSE 116 (H) 06/11/2021 1427   BUN 18 06/11/2021 1427   CREATININE 3.93 (H) 06/11/2021 1427  CREATININE 1.59 (H) 07/29/2015 0943   CALCIUM 8.6 (L) 06/11/2021 1427   CALCIUM 8.4 (L) 09/14/2016 1615   PROT 6.4 (L) 06/11/2021 1427   ALBUMIN 2.4 (L) 06/11/2021 1427   AST 18 06/11/2021 1427   ALT 9 06/11/2021 1427   ALKPHOS 59 06/11/2021 1427   BILITOT 0.6 06/11/2021 1427   GFRNONAA 17 (L) 06/11/2021 1427   GFRNONAA 48 (L) 07/29/2015 0943   GFRAA 7 (L) 05/18/2019 2220   GFRAA 56 (L) 07/29/2015 0943    Imaging: CT ABDOMEN PELVIS W CONTRAST  Result Date: 06/11/2021 CLINICAL DATA:  Evaluate for small bowel obstruction. No bowel movement or voiding for 3 days. EXAM: CT ABDOMEN AND PELVIS WITH CONTRAST TECHNIQUE: Multidetector CT imaging of the abdomen and pelvis was performed using the standard protocol following bolus administration of intravenous contrast. CONTRAST:  49mL OMNIPAQUE IOHEXOL 350 MG/ML SOLN COMPARISON:  CT abdomen dated 05/20/2021. FINDINGS: Lower chest: Dense bibasilar consolidations, increased compared to the previous exam. Hepatobiliary: No focal liver abnormality is seen. Probable gallstone within the nondistended gallbladder. Gallbladder walls cannot be evaluated due to surrounding ascites. Mild intrahepatic bile duct prominence. Pancreas: Pancreatic duct is again dilated, measuring up to 8 mm diameter. No pancreatic mass is identified. Spleen: Hypodense lesion within  the upper spleen, measuring 1.9 cm, as previously described and again favored to represent a cyst. Additional subtle hypodense foci are now seen along the inferior margin of the spleen, not convincingly seen on previous exams and therefore suspicious for splenic infarct or neoplastic lesions, possibly edema related to the surrounding ascites. Adrenals/Urinary Tract: Adrenal glands are unremarkable. Innumerable hypodense masses within the bilateral atrophic kidneys, majority favored to represent cysts. Again noted is the dominant hypodense mass within the RIGHT kidney, measuring 3.4 cm, suggesting neoplastic mass such as renal cell carcinoma. No hydronephrosis. Bladder is not seen, presumably obscured by surrounding ascites. Stomach/Bowel: No dilated large or small bowel loops. Single patulous loop of large bowel within the upper abdomen at the site of a previous surgical bowel anastomosis, but no proximal or distal bowel dilatation to suggest mechanical obstruction. Moderate amount of gas and stool within the colon. Fluid and associated air-fluid levels are seen within the nondistended small bowel, suggesting ileus. Vascular/Lymphatic: Aortic atherosclerosis. No acute-appearing vascular abnormality. No enlarged lymph nodes are seen, although characterization is limited by the large volume ascites. Reproductive: Prostate is unremarkable. Other: Large volume ascites throughout the abdomen and pelvis. No evidence of free intraperitoneal air. Musculoskeletal: No acute or suspicious osseous abnormality. IMPRESSION: 1. Large volume ascites throughout the abdomen and pelvis. This has significantly increased compared to the CT abdomen of 05/20/2021. 2. Status post RIGHT hemicolectomy. Single patulous loop of large bowel within the upper abdomen at the site of the associated surgical bowel anastomosis, but no proximal or distal bowel dilatation to suggest mechanical obstruction. 3. Fluid and associated air-fluid levels  within the nondistended small bowel, suggesting ileus. 4. Dominant hypodense mass within the RIGHT kidney measures 3.4 cm, highly suspicious for neoplastic mass such as renal cell carcinoma. 5. Numerous additional cysts within the bilateral atrophic kidneys. 6. New subtle hypodense foci along the inferior margin of the spleen, suspicious for splenic infarcts or neoplastic/metastatic lesions, less likely parenchymal edema related to the surrounding ascites. Stable presumed cyst within the upper spleen. 7. Dense bibasilar consolidations, increased compared to the previous exam. These could represent pneumonia or aspiration. 8. Cholelithiasis. 9. Pancreatic duct dilatation, stable compared to CT abdomen of 05/20/2021. This is of uncertain etiology. Aortic  Atherosclerosis (ICD10-I70.0). Electronically Signed   By: Franki Cabot M.D.   On: 06/11/2021 17:41    EKG: None performed during this admission   ASSESSMENT & PLAN:    Assessment & Plan by Problem: Active Problems:   Ileus ([place])   Jahdiel Krol is a 60 y.o. with pertinent PMHx of ESRD on HD T/Th/Sat, colon cancer s/p resection, HFpEF, hypertension and polysubstance use  who presented with abdominal pain in constipation for the past 4 days and admitted for suspected ileus on hospital day 0  Ileus Patient status post resection secondary to colon cancer and patient was recently admitted under our services for small bowel obstruction and taken to the operating room by surgery for lysis of adhesions.  Since being discharged patient endorses continued bright red bloody bowel movements.  He has not had a bowel movement in 4 days.  CT abdomen pelvis without evidence of obstruction but suspected ileus.  Patient does have bowel sounds on examination but is distended and has generalized tenderness.  Do not suspect acute abdomen at this time.  Suspect leukocytosis secondary to ileus/constipation.  General surgery consulted by ED we will keep patient n.p.o.  continue with IV fluids and pain management. -General surgery following, appreciate their assistance in this patient's care -NPO, gentle fluids as patient is ESRD -If vomiting, will order NG tube -Pain management with IV Dilaudid as needed -Nutrition consult  Ascites Patient with large volume ascites throughout abdomen and pelvis with significant increase since prior CT abdomen on 05/20/2021.  Possible etiologies are his hypoalbuminemia, history of malignancy and with newly found renal mass and possible splenic mass, ileus.  Patient states he has been afebrile and without fever chills does not appear to have an acute abdomen on exam, however with leukocytosis unable to rule out bacterial peritonitis.  Do not suspect cirrhosis, liver imaging unremarkable and without elevation in liver enzymes or evidence of cirrhosis elsewhere.  Do not suspect secondary to volume overloaded, patient with dialysis session today and has not missed 1 in the past few weeks.  Do not believe he needs emergent paracentesis at this time. 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  Patient on dialysis Tuesday, Thursday, Saturday with left fistula in place.  Last dialysis session was 08/27. Cr of 3.93. Electrolytes stable.  Patient finished full session without difficulty.  Left AV fistula in place with good bruit.  Next dialysis session planned for Monday, uncertain if he will still be in the hospital at that time.  Day team can consider consulting nephrology tomorrow to set up hospital dialysis sessions.  We will continue to monitor his fluid status closely.  Patient did endorse brown urine over the last 24 hours, prior documentation notes he does not make urine anymore.  Anemia secondary to ESRD. -Daily RFP, CBC -Consult nephrology tomorrow morning for  dialysis while admitted -Monitor for urine output, with malignancy there is some concern that he is having new urinary output.  Hypertension BP stable at this time.  Patient on diltiazem at home.  With n.p.o. status we will start IV hydro every 4 hours as needed -IV hydralazine q4h prn SBP>180 give 10 mg, or SBP>210 give 20 mg -Once no longer n.p.o., restart p.o. diltiazem  Renal mass Splenic mass Hypodense right renal mass measuring 3.4 cm, seen on prior CT imaging and being followed by nephrology on  outpatient basis.  Patient with hypodense lesions within upper spleen that was 1.8 cm in diameter.  Likely to represent a cyst.  Also seen was on the inferior margin of the spleen a subtle hypodense foci not seen on previous exams and suspicious for splenic infarct versus neoplastic lesion.  Possibly also secondary to edema and surrounding ascites.  Would recommend follow-up imaging once resolution of ascites -Follow-up nephrology -Repeat imaging once ascites is resolved  Diet: NPO VTE: Enoxaparin IVF: None,None Code: Full  Prior to Admission Living Arrangement: Home, living family Anticipated Discharge Location: Home Barriers to Discharge: Further evaluation for his abd pain,ileus  Dispo: Admit patient to Observation with expected length of stay less than 2 midnights.  Signed: Riesa Pope, MD Internal Medicine Resident PGY-2 Pager: [phone]  06/11/2021, 11:38 PM

## 2021-06-11 NOTE — ED Provider Notes (Signed)
[facility] EMERGENCY DEPARTMENT Provider Note   CSN: [id number] Arrival date & time: 06/11/21  1317     History Chief Complaint  Patient presents with   Constipation   Urinary Retention    Timothy [name] is a 60 y.o. male.   Constipation Associated symptoms: abdominal pain   Associated symptoms: no back pain, no dysuria, no fever and no vomiting    60 year old male with a past medical history of ESRD on HD most recently dialyzed today, CHF, SBO status post surgical repair on 05/20/21 presenting to the emergency department for generalized abdominal pain and constipation.  Patient reports that he has not had a bowel movement in the past 3 days.  He states that he passed gas as recently as this morning.  He reports generalized abdominal pain that is progressively worsening.  He states that he has had to receive a suppository for constipation in the past.  He denies any nausea or vomiting but does report some decreased p.o. intake.  He states that his abdominal pain is generalized, constant, nonradiating.  It is associated with abdominal distention.  No attempted therapies at home.  It is similar to his previous SBO.  Past Medical History:  Diagnosis Date   Anemia of chronic kidney failure    BPH (benign prostatic hyperplasia)    Colon cancer ([place]) 2014   End stage renal disease on dialysis ([place]) 2017   Hypertension    Hypertensive heart disease with chronic diastolic congestive heart failure ([place]) 07/17/2016   Polysubstance abuse ([place])    History of heroin and marijuana use    Patient Active Problem List   Diagnosis Date Noted   Small bowel obstruction ([place]) 05/20/2021   DOE (dyspnea on exertion) 10/25/2020   Orthopnea 10/25/2020   Open wound of left hand 01/18/2018   Tibial fracture 01/14/2018   Tibial plateau fracture, left 01/13/2018   Malnutrition of moderate degree 09/20/2016   AKI (acute kidney injury) ([place])    History of colon cancer     Hyponatremia    Leukocytosis    Acute blood loss anemia    Acute metabolic encephalopathy 70/62/3762   ESRD on dialysis ([place]) 09/09/2016   Hypertensive emergency 09/09/2016   Acute respiratory failure with hypoxia ([place]) 09/09/2016   Acute pulmonary edema ([place]) 09/09/2016   Polysubstance abuse ([place]) 09/09/2016   Nonadherence to medical treatment 09/09/2016   Anemia due to chronic kidney disease 09/09/2016   Altered mental status 09/09/2016   Acute respiratory failure ([place])    Drug ingestion    Hypertensive heart and chronic kidney disease with heart failure and with stage 5 chronic kidney disease, or end stage renal disease ([place]) 07/19/2016   Chest pain    Elevated troponin 83/15/1761   Acute diastolic heart failure, NYHA class 2 ([place]) 07/17/2016   Heroin abuse ([place]) 07/17/2016   HTN (hypertension) 07/17/2016   Benign prostate hyperplasia 07/16/2015    Past Surgical History:  Procedure Laterality Date   ABDOMINAL SURGERY     AV FISTULA PLACEMENT Left 09/19/2016   Procedure: Left arm Radiocephalic ARTERIOVENOUS (AV) FISTULA CREATION;  Surgeon: Conrad [place], MD;  Location: [place];  Service: Vascular;  Laterality: Left;   [place] Left 07/09/2017   Procedure: BRACHIOCEPHALIC FISTULA CREATION;  Surgeon: Conrad [place], MD;  Location: [place];  Service: Vascular;  Laterality: Left;   COLON SURGERY  2014   INSERTION OF DIALYSIS CATHETER N/A 09/19/2016   Procedure: INSERTION OF TUNNELED DIALYSIS CATHETER;  Surgeon: Conrad [place], MD;  Location: [place];  Service: Vascular;  Laterality: N/A;   IR GENERIC HISTORICAL  09/14/2016   IR US GUIDE VASC ACCESS RIGHT 09/14/2016 [name], DO [facility]   IR GENERIC HISTORICAL  09/14/2016   IR FLUORO GUIDE CV LINE RIGHT 09/14/2016 [name], DO [facility]   LAPAROTOMY N/A 05/23/2021   Procedure: EXPLORATORY LAPAROTOMY LYSIS ADHESIONS;  Surgeon: [name], MD;  Location: [place];  Service: General;  Laterality: N/A;    ORIF TIBIA PLATEAU Left 01/15/2018   Procedure: OPEN REDUCTION INTERNAL FIXATION (ORIF) TIBIAL PLATEAU;  Surgeon: Altamese Alorton, MD;  Location: [place];  Service: Orthopedics;  Laterality: Left;   TRANSTHORACIC ECHOCARDIOGRAM  07/2016    EF 60-65%, No RWMA. Mod Concentric LVH - Gr 2 DD. Severe LA dilation. PAP ~35 mmHg (mild Pulm HTN)  --> no changes noted 1 month later       Family History  Problem Relation Age of Onset   Heart failure Mother        Died at age 57.   Heart attack Mother 77   Hypertension Mother    Diabetes Mellitus II Mother    Other Father        Unknown   Kidney failure Sister        (Oldest Sister)   Other Other        Multiple siblings have started her heart disease, he is not sure of the details.   CAD Nephew     Social History   Tobacco Use   Smoking status: Former    Packs/day: 0.25    Types: Cigarettes    Quit date: 07/06/2017    Years since quitting: 3.9   Smokeless tobacco: Never  Vaping Use   Vaping Use: Never used  Substance Use Topics   Alcohol use: No    Alcohol/week: 7.0 standard drinks    Types: 7 Cans of beer per week   Drug use: Yes    Frequency: 1.0 times per week    Types: Marijuana, Heroin    Comment: snorts heroin, takes pain pills that he buys off the street    Home Medications Prior to Admission medications   Medication Sig Start Date End Date Taking? Authorizing Provider  diltiazem (CARDIZEM CD) 360 MG 24 hr capsule Take 360 mg by mouth daily. 06/19/17  Yes [provider]  lidocaine-prilocaine (EMLA) cream Apply 1 application topically See admin instructions. Tuesday,thursday,Saturday prior to dialysis 05/12/21  Yes [provider]  sevelamer carbonate (RENVELA) 800 MG tablet Take 2 tablets by mouth with breakfast, with lunch, and with evening meal. 05/12/21  Yes [provider]  acetaminophen (TYLENOL) 500 MG tablet Take 2 tablets (1,000 mg total) by mouth every 6 (six) hours as needed for mild pain or  moderate pain. Patient not taking: Reported on 06/11/2021 05/27/21   Lajean Manes, MD  docusate sodium (COLACE) 250 MG capsule Take 1 capsule (250 mg total) by mouth daily. Patient not taking: No sig reported 05/19/19   McDonald, Mia A, PA-C  lactulose (CEPHULAC) 10 g packet Take 1 packet (10 g total) by mouth 3 (three) times daily. Patient not taking: No sig reported 05/19/19   McDonald, Mia A, PA-C    Allergies    No known allergies  Review of Systems   Review of Systems  Constitutional:  Negative for chills and fever.  HENT:  Negative for ear pain and sore throat.   Eyes:  Negative for pain  and visual disturbance.  Respiratory:  Negative for cough and shortness of breath.   Cardiovascular:  Negative for chest pain and palpitations.  Gastrointestinal:  Positive for abdominal distention, abdominal pain and constipation. Negative for vomiting.  Genitourinary:  Negative for dysuria and hematuria.  Musculoskeletal:  Negative for arthralgias and back pain.  Skin:  Negative for color change and rash.  Neurological:  Negative for seizures and syncope.  All other systems reviewed and are negative.  Physical Exam Updated Vital Signs BP (!) 158/101   Pulse 80   Temp 98.5 F (36.9 C) (Oral)   Resp 13   SpO2 96%   Physical Exam Vitals and nursing note reviewed.  Constitutional:      Appearance: He is well-developed.     Comments: Thin, bitemporal wasting  HENT:     Head: Normocephalic and atraumatic.  Eyes:     Conjunctiva/sclera: Conjunctivae normal.  Cardiovascular:     Rate and Rhythm: Normal rate and regular rhythm.     Heart sounds: No murmur heard. Pulmonary:     Effort: Pulmonary effort is normal. No respiratory distress.     Breath sounds: Normal breath sounds.  Abdominal:     General: There is distension.     Palpations: Abdomen is soft.     Tenderness: There is abdominal tenderness.     Comments: Abdomen is soft, but has significant distention.  Well-healed midline  vertical abdominal incision.  There is generalized tenderness.  Patient sitting with his knees curled into his chest, but is able to extend.  Musculoskeletal:     Cervical back: Neck supple.  Skin:    General: Skin is warm and dry.     Capillary Refill: Capillary refill takes less than 2 seconds.  Neurological:     Mental Status: He is alert and oriented to person, place, and time.    ED Results / Procedures / Treatments   Labs (all labs ordered are listed, but only abnormal results are displayed) Labs Reviewed  CBC WITH DIFFERENTIAL/PLATELET - Abnormal; Notable for the following components:      Result Value   WBC 12.3 (*)    RBC 3.52 (*)    Hemoglobin 10.5 (*)    HCT 31.4 (*)    Neutro Abs 10.1 (*)    Lymphs Abs 0.5 (*)    Monocytes Absolute 1.5 (*)    Abs Immature Granulocytes 0.11 (*)    All other components within normal limits  COMPREHENSIVE METABOLIC PANEL - Abnormal; Notable for the following components:   Chloride 93 (*)    CO2 33 (*)    Glucose, Bld 116 (*)    Creatinine, Ser 3.93 (*)    Calcium 8.6 (*)    Total Protein 6.4 (*)    Albumin 2.4 (*)    GFR, Estimated 17 (*)    All other components within normal limits  SARS CORONAVIRUS 2 (TAT 6-24 HRS)  LIPASE, BLOOD  LACTIC ACID, PLASMA  LACTIC ACID, PLASMA    EKG None  Radiology CT ABDOMEN PELVIS W CONTRAST  Result Date: 06/11/2021 CLINICAL DATA:  Evaluate for small bowel obstruction. No bowel movement or voiding for 3 days. EXAM: CT ABDOMEN AND PELVIS WITH CONTRAST TECHNIQUE: Multidetector CT imaging of the abdomen and pelvis was performed using the standard protocol following bolus administration of intravenous contrast. CONTRAST:  21mL OMNIPAQUE IOHEXOL 350 MG/ML SOLN COMPARISON:  CT abdomen dated 05/20/2021. FINDINGS: Lower chest: Dense bibasilar consolidations, increased compared to the previous exam. Hepatobiliary: No focal  liver abnormality is seen. Probable gallstone within the nondistended gallbladder.  Gallbladder walls cannot be evaluated due to surrounding ascites. Mild intrahepatic bile duct prominence. Pancreas: Pancreatic duct is again dilated, measuring up to 8 mm diameter. No pancreatic mass is identified. Spleen: Hypodense lesion within the upper spleen, measuring 1.9 cm, as previously described and again favored to represent a cyst. Additional subtle hypodense foci are now seen along the inferior margin of the spleen, not convincingly seen on previous exams and therefore suspicious for splenic infarct or neoplastic lesions, possibly edema related to the surrounding ascites. Adrenals/Urinary Tract: Adrenal glands are unremarkable. Innumerable hypodense masses within the bilateral atrophic kidneys, majority favored to represent cysts. Again noted is the dominant hypodense mass within the RIGHT kidney, measuring 3.4 cm, suggesting neoplastic mass such as renal cell carcinoma. No hydronephrosis. Bladder is not seen, presumably obscured by surrounding ascites. Stomach/Bowel: No dilated large or small bowel loops. Single patulous loop of large bowel within the upper abdomen at the site of a previous surgical bowel anastomosis, but no proximal or distal bowel dilatation to suggest mechanical obstruction. Moderate amount of gas and stool within the colon. Fluid and associated air-fluid levels are seen within the nondistended small bowel, suggesting ileus. Vascular/Lymphatic: Aortic atherosclerosis. No acute-appearing vascular abnormality. No enlarged lymph nodes are seen, although characterization is limited by the large volume ascites. Reproductive: Prostate is unremarkable. Other: Large volume ascites throughout the abdomen and pelvis. No evidence of free intraperitoneal air. Musculoskeletal: No acute or suspicious osseous abnormality. IMPRESSION: 1. Large volume ascites throughout the abdomen and pelvis. This has significantly increased compared to the CT abdomen of 05/20/2021. 2. Status post RIGHT  hemicolectomy. Single patulous loop of large bowel within the upper abdomen at the site of the associated surgical bowel anastomosis, but no proximal or distal bowel dilatation to suggest mechanical obstruction. 3. Fluid and associated air-fluid levels within the nondistended small bowel, suggesting ileus. 4. Dominant hypodense mass within the RIGHT kidney measures 3.4 cm, highly suspicious for neoplastic mass such as renal cell carcinoma. 5. Numerous additional cysts within the bilateral atrophic kidneys. 6. New subtle hypodense foci along the inferior margin of the spleen, suspicious for splenic infarcts or neoplastic/metastatic lesions, less likely parenchymal edema related to the surrounding ascites. Stable presumed cyst within the upper spleen. 7. Dense bibasilar consolidations, increased compared to the previous exam. These could represent pneumonia or aspiration. 8. Cholelithiasis. 9. Pancreatic duct dilatation, stable compared to CT abdomen of 05/20/2021. This is of uncertain etiology. Aortic Atherosclerosis (ICD10-I70.0). Electronically Signed   By: Franki Cabot M.D.   On: 06/11/2021 17:41    Procedures Procedures   Medications Ordered in ED Medications  acetaminophen (TYLENOL) tablet 650 mg (650 mg Oral Given 06/11/21 2126)  ondansetron (ZOFRAN-ODT) disintegrating tablet 4 mg (4 mg Oral Given 06/11/21 1435)  HYDROmorphone (DILAUDID) injection 1 mg (1 mg Intravenous Given 06/11/21 1638)  lactated ringers bolus 500 mL (0 mLs Intravenous Stopped 06/11/21 1732)  iohexol (OMNIPAQUE) 350 MG/ML injection 75 mL (75 mLs Intravenous Contrast Given 06/11/21 1720)  HYDROmorphone (DILAUDID) injection 1 mg (1 mg Intravenous Given 06/11/21 2158)    ED Course  I have reviewed the triage vital signs and the nursing notes.  Pertinent labs & imaging results that were available during my care of the patient were reviewed by me and considered in my medical decision making (see chart for details).    MDM  Rules/Calculators/A&P  60 year old male with past medical history of ESRD on HD and recent SBO requiring operative intervention with adhesion lysis presenting to the emergency department with 3 days of constipation, diffuse abdominal pain.  Vital signs reviewed, mildly hypertensive but otherwise within acceptable limits.  Physical exam notable for a generally tense abdomen, he holds his leg to his chest.  He is not currently vomiting.  Differential includes SBO, ileus, postop complication.  He denies any fever or other infectious symptoms.  CBC obtained in triage does show mild leukocytosis of 12.3, up from 6.8.  Hemoglobin of 10.5.  Metabolic panel largely within expectations given his ESRD status, potassium of 3.7, bicarb slightly elevated at 33.  No lactic acidosis.  CT of the abdomen shows large volume ascites as well as a single loop of mildly patulous large bowel but no focal transition point to suggest mechanical obstruction.  However, the patient does have air-fluid levels concerning for ileus.  There is also an incidental finding of possible neoplastic mass in the renal kidney as well as possible lesions in the spleen that could be infarct or neoplastic in etiology.  Given that the patient has an ileus and is not currently tolerating p.o., believe that he warrants inpatient admission.  I discussed the case with general surgery, they do not recommend any operative intervention at this time and recommend admission to medicine for fluids and observation.  Handoff given to the admitting internal medicine team.  Final Clinical Impression(s) / ED Diagnoses Final diagnoses:  Ileus [facility])    Rx / DC Orders ED Discharge Orders     None        Claud Kelp, MD 06/11/21 2207    Tegeler, Gwenyth Allegra, MD 06/13/21 1126

## 2021-06-11 NOTE — ED Triage Notes (Signed)
Patient here for constipation and urinary retention. Has not urinated or had bowel movement in 3 days. VSS.

## 2021-06-11 NOTE — ED Provider Notes (Signed)
Emergency Medicine Provider Triage Evaluation Note  Timothy [name], a 60 y.o. male  was evaluated in triage.  Pt complains of generalized abdominal pain.  States that he has had no bowel movement and decreased urination over the past few days states that his abdomen is severe 10/10 achy pain.  Denies any chest pain shortness of breath lightheadedness or dizziness.  He had surgery for SBO 8/8  Review of Systems  Positive: Abdominal pain decreased p.o. intake, nausea vomiting, decreased bowel movements Negative: Fever  Physical Exam  BP 138/86 (BP Location: Right Arm)   Pulse 88   Temp 98.1 F (36.7 C) (Oral)   Resp 16   SpO2 93%  Gen:   Awake, no distress   Resp:  Normal effort  MSK:   Moves extremities without difficulty  Other:  Abdomen is distended.  Diffuse lower abdominal pain.  Medical Decision Making  Medically screening exam initiated at 2:16 PM.  Appropriate orders placed.  Zaelyn Barbary was informed that the remainder of the evaluation will be completed by another provider, this initial triage assessment does not replace that evaluation, and the importance of remaining in the ED until their evaluation is complete.  Given patient is a dialysis patient will obtain CT Noncon and pelvis.   Pati Gallo [place], [place] 06/11/21 1433    Lacretia Leigh, MD 06/14/21 [phone]

## 2021-06-12 ENCOUNTER — Encounter (HOSPITAL_COMMUNITY): Payer: Self-pay | Admitting: Internal Medicine

## 2021-06-12 DIAGNOSIS — Z20822 Contact with and (suspected) exposure to covid-19: Secondary | ICD-10-CM | POA: Diagnosis present

## 2021-06-12 DIAGNOSIS — Z85038 Personal history of other malignant neoplasm of large intestine: Secondary | ICD-10-CM | POA: Diagnosis not present

## 2021-06-12 DIAGNOSIS — Z8249 Family history of ischemic heart disease and other diseases of the circulatory system: Secondary | ICD-10-CM | POA: Diagnosis not present

## 2021-06-12 DIAGNOSIS — N401 Enlarged prostate with lower urinary tract symptoms: Secondary | ICD-10-CM | POA: Diagnosis present

## 2021-06-12 DIAGNOSIS — Z833 Family history of diabetes mellitus: Secondary | ICD-10-CM | POA: Diagnosis not present

## 2021-06-12 DIAGNOSIS — R338 Other retention of urine: Secondary | ICD-10-CM | POA: Diagnosis present

## 2021-06-12 DIAGNOSIS — R64 Cachexia: Secondary | ICD-10-CM | POA: Diagnosis present

## 2021-06-12 DIAGNOSIS — Z23 Encounter for immunization: Secondary | ICD-10-CM | POA: Diagnosis present

## 2021-06-12 DIAGNOSIS — E871 Hypo-osmolality and hyponatremia: Secondary | ICD-10-CM | POA: Diagnosis not present

## 2021-06-12 DIAGNOSIS — Z841 Family history of disorders of kidney and ureter: Secondary | ICD-10-CM | POA: Diagnosis not present

## 2021-06-12 DIAGNOSIS — I272 Pulmonary hypertension, unspecified: Secondary | ICD-10-CM | POA: Diagnosis present

## 2021-06-12 DIAGNOSIS — I50810 Right heart failure, unspecified: Secondary | ICD-10-CM | POA: Diagnosis present

## 2021-06-12 DIAGNOSIS — K659 Peritonitis, unspecified: Secondary | ICD-10-CM | POA: Diagnosis present

## 2021-06-12 DIAGNOSIS — R188 Other ascites: Secondary | ICD-10-CM | POA: Diagnosis present

## 2021-06-12 DIAGNOSIS — E43 Unspecified severe protein-calorie malnutrition: Secondary | ICD-10-CM | POA: Diagnosis present

## 2021-06-12 DIAGNOSIS — E8809 Other disorders of plasma-protein metabolism, not elsewhere classified: Secondary | ICD-10-CM | POA: Diagnosis present

## 2021-06-12 DIAGNOSIS — F322 Major depressive disorder, single episode, severe without psychotic features: Secondary | ICD-10-CM | POA: Diagnosis present

## 2021-06-12 DIAGNOSIS — I132 Hypertensive heart and chronic kidney disease with heart failure and with stage 5 chronic kidney disease, or end stage renal disease: Secondary | ICD-10-CM | POA: Diagnosis present

## 2021-06-12 DIAGNOSIS — R1084 Generalized abdominal pain: Secondary | ICD-10-CM | POA: Diagnosis not present

## 2021-06-12 DIAGNOSIS — D631 Anemia in chronic kidney disease: Secondary | ICD-10-CM | POA: Diagnosis present

## 2021-06-12 DIAGNOSIS — K567 Ileus, unspecified: Principal | ICD-10-CM

## 2021-06-12 DIAGNOSIS — N186 End stage renal disease: Secondary | ICD-10-CM | POA: Diagnosis present

## 2021-06-12 DIAGNOSIS — N2581 Secondary hyperparathyroidism of renal origin: Secondary | ICD-10-CM | POA: Diagnosis present

## 2021-06-12 DIAGNOSIS — Z992 Dependence on renal dialysis: Secondary | ICD-10-CM | POA: Diagnosis not present

## 2021-06-12 DIAGNOSIS — I350 Nonrheumatic aortic (valve) stenosis: Secondary | ICD-10-CM | POA: Diagnosis not present

## 2021-06-12 DIAGNOSIS — R161 Splenomegaly, not elsewhere classified: Secondary | ICD-10-CM | POA: Diagnosis not present

## 2021-06-12 DIAGNOSIS — R45851 Suicidal ideations: Secondary | ICD-10-CM | POA: Diagnosis not present

## 2021-06-12 DIAGNOSIS — I5032 Chronic diastolic (congestive) heart failure: Secondary | ICD-10-CM | POA: Diagnosis present

## 2021-06-12 LAB — RENAL FUNCTION PANEL
Albumin: 2.1 g/dL — ABNORMAL LOW (ref 3.5–5.0)
Anion gap: 12 (ref 5–15)
BUN: 30 mg/dL — ABNORMAL HIGH (ref 6–20)
CO2: 31 mmol/L (ref 22–32)
Calcium: 8.8 mg/dL — ABNORMAL LOW (ref 8.9–10.3)
Chloride: 94 mmol/L — ABNORMAL LOW (ref 98–111)
Creatinine, Ser: 4.92 mg/dL — ABNORMAL HIGH (ref 0.61–1.24)
GFR, Estimated: 13 mL/min — ABNORMAL LOW (ref >60)
Glucose, Bld: 68 mg/dL — ABNORMAL LOW (ref 70–99)
Phosphorus: 6.3 mg/dL — ABNORMAL HIGH (ref 2.5–4.6)
Potassium: 4.4 mmol/L (ref 3.5–5.1)
Sodium: 137 mmol/L (ref 135–145)

## 2021-06-12 LAB — CBC WITH DIFFERENTIAL/PLATELET
Abs Immature Granulocytes: 0.14 10*3/uL — ABNORMAL HIGH (ref 0.00–0.07)
Basophils Absolute: 0 10*3/uL (ref 0.0–0.1)
Basophils Relative: 0 %
Eosinophils Absolute: 0 10*3/uL (ref 0.0–0.5)
Eosinophils Relative: 0 %
HCT: 34.3 % — ABNORMAL LOW (ref 39.0–52.0)
Hemoglobin: 11.6 g/dL — ABNORMAL LOW (ref 13.0–17.0)
Immature Granulocytes: 1 %
Lymphocytes Relative: 4 %
Lymphs Abs: 0.7 10*3/uL (ref 0.7–4.0)
MCH: 29.7 pg (ref 26.0–34.0)
MCHC: 33.8 g/dL (ref 30.0–36.0)
MCV: 87.7 fL (ref 80.0–100.0)
Monocytes Absolute: 1.3 10*3/uL — ABNORMAL HIGH (ref 0.1–1.0)
Monocytes Relative: 9 %
Neutro Abs: 13.1 10*3/uL — ABNORMAL HIGH (ref 1.7–7.7)
Neutrophils Relative %: 86 %
Platelets: 369 10*3/uL (ref 150–400)
RBC: 3.91 MIL/uL — ABNORMAL LOW (ref 4.22–5.81)
RDW: 14.2 % (ref 11.5–15.5)
WBC: 15.2 10*3/uL — ABNORMAL HIGH (ref 4.0–10.5)
nRBC: 0 % (ref 0.0–0.2)

## 2021-06-12 LAB — SARS CORONAVIRUS 2 (TAT 6-24 HRS): SARS Coronavirus 2: NEGATIVE

## 2021-06-12 LAB — MAGNESIUM: Magnesium: 2.2 mg/dL (ref 1.7–2.4)

## 2021-06-12 MED ORDER — HYDROMORPHONE HCL 1 MG/ML IJ SOLN
0.5000 mg | INTRAMUSCULAR | Status: DC | PRN
Start: 2021-06-12 — End: 2021-06-15
  Administered 2021-06-12 – 2021-06-15 (×17): 0.5 mg via INTRAVENOUS
  Filled 2021-06-12 (×17): qty 0.5

## 2021-06-12 MED ORDER — BISACODYL 10 MG RE SUPP
10.0000 mg | Freq: Every day | RECTAL | Status: DC
  Administered 2021-06-12 – 2021-06-20 (×6): 10 mg via RECTAL
  Filled 2021-06-12 (×8): qty 1

## 2021-06-12 MED ORDER — LACTATED RINGERS IV SOLN: INTRAVENOUS | Status: AC

## 2021-06-12 MED ORDER — HYDRALAZINE HCL 20 MG/ML IJ SOLN
10.0000 mg | INTRAMUSCULAR | Status: DC | PRN
  Administered 2021-06-16 – 2021-06-25 (×2): 10 mg via INTRAVENOUS
  Filled 2021-06-12 (×4): qty 1

## 2021-06-12 MED ORDER — HYDROMORPHONE HCL 1 MG/ML IJ SOLN: 0.5000 mg | Freq: Four times a day (QID) | INTRAMUSCULAR | Status: DC | PRN

## 2021-06-12 MED ORDER — HYDROMORPHONE HCL 1 MG/ML IJ SOLN
0.5000 mg | Freq: Four times a day (QID) | INTRAMUSCULAR | Status: DC | PRN
  Administered 2021-06-12 (×2): 0.5 mg via INTRAVENOUS
  Filled 2021-06-12: qty 1
  Filled 2021-06-12: qty 0.5

## 2021-06-12 MED ORDER — HYDROMORPHONE HCL 1 MG/ML IJ SOLN
0.5000 mg | Freq: Once | INTRAMUSCULAR | Status: AC
Start: 2021-06-12 — End: 2021-06-12
  Administered 2021-06-12: 0.5 mg via INTRAVENOUS
  Filled 2021-06-12: qty 0.5

## 2021-06-12 NOTE — Progress Notes (Addendum)
Subjective: No acute overnight events. Patient was seen at bedside during rounds this morning. Pt reports some pain this AM. Pt complains of abdominal pain, but states that is controlled with pain medications. He states he had a small BM this AM. Pt denies SOB, CP, fever, N/V.. No other complains or concerns at this time.    Objective:  Vital signs in last 24 hours: Vitals:   06/12/21 2321 06/13/21 0400 06/13/21 0808 06/13/21 1146  BP: (!) 157/102 (!) 151/95 (!) 152/95 (!) 167/104  Pulse: 98 97 96 91  Resp: 16 17 18 18   Temp: 98.5 F (36.9 C) 97.8 F (36.6 C) 97.6 F (36.4 C) 97.8 F (36.6 C)  TempSrc: Oral Oral Oral Axillary  SpO2: 95% 93% 95% 96%   Constitutional: alert, cachectic-appearing, appears to be in mild distress HENT: normocephalic, atraumatic, mucous membranes moist Cardiovascular: regular rate and rhythm, no m/r/g Pulmonary/Chest: normal work of breathing on room air, lungs clear to auscultation bilaterally Abdominal: soft, mildly tender to palpation in upper quadrants, mildly distended  MSK: normal bulk and tone Neurological: alert & oriented x 3  Skin: warm and dry Psych: normal behavior, normal affect   Assessment/Plan:  Active Problems:   Ileus (HCC)  Timothy [name] is a 60 y.o. with pertinent PMHx of ESRD on HD T/Th/Sat, colon cancer s/p resection, HFpEF, HTN and polysubstance use  who presented with abdominal pain and constipation for the past 4 days and admitted for ileus.    Ileus Pt had 2 small BM yesterday and 1 small BM today. He is requesting clear liquid diet as he has not eaten in 5 days, will advance diet. Complains of some pain in upper abdomen, similar to admission. States it is controlled with pain medications, due for next q4 dilaudid dose soon (interval decreased from q6 to q4 yesterday). He continues to require it q4h. Remains afebrile, and leukocytosis improving 15.3 > 14.3.  -advance to clear liquid diet  -continue to monitor for  flatus and BM -continue pain management with IV Dilaudid 0.5 mg q4h prn -will consider NG tube if develops N/V  -Nutrition consult   Ascites Patient with large volume ascites throughout abdomen and pelvis with significant increase since prior CT abdomen on 05/20/2021. Likely due to ESRD/hypoalbuminemia but considering increased volume, elective diagnostic paracentesis could be beneficial. Pt remains afebrile, no acute abdomen on exam and improving leukocytosis 15.3 > 14.3; bacterial peritonitis unlikely. -pt agreeable to therapeutic/ diagnostic paracentesis today  -Will consider starting abx if pt becomes febrile or develops leukocytosis.    Hypertension SBPs 160-170s, held home diltiazem as NPO. -Will restart diltiazem -IV hydralazine q4h prn SBP>180 give 10 mg, or SBP>210 give 20 mg  ESRD HD T/Th/Sat. Pt completed a full session of HD on 8/27, next due on Tuesday. K 5.7, due for dialysis tomorrow. Anemia secondary to ESRD.  -Consulted nephrology  -HD per nephrology   Renal mass Splenic mass Hypodense right renal mass measuring 3.4 cm, seen on prior CT imaging and being followed by nephrology on outpatient basis. Patient with hypodense lesions within upper spleen that was 1.8 cm in diameter. Likely to represent a cyst. Also seen was on the inferior margin of the spleen a subtle hypodense foci not seen on previous exams and suspicious for splenic infarct versus neoplastic lesion. Possibly also secondary to edema and surrounding ascites. -Follow-up nephrology -Consider repeat imaging once ascites is resolved  Best Practice: Diet: clear liquid diet  IVF: none  VTE: Enoxaparin 30  Code:  Full  Signature: Lajean Manes, MD  Internal Medicine Resident, PGY-1 [facility]  Pager: [phone] After 5pm on weekdays and 1pm on weekends: On Call pager [phone]

## 2021-06-12 NOTE — Progress Notes (Signed)
Patient arrived to 6N15, AX0X4, able to make needs known. Denies nausea/vomiting. He has c/o 7/10 Abd pain-recently given pain med in ED. VS BP T97.5 RR18 PR81 BP 155/101 94%on room air. See Flowsheets for full assessment details. Patient was oriented to call bell and surroundings.  Call bell within reach and will continue to close monitor.

## 2021-06-12 NOTE — Hospital Course (Addendum)
Timothy [name] is a 60 y.o. with pertinent PMHx of ESRD on HD T/Th/Sat, colon cancer s/p resection, HFpEF, HTN and polysubstance use  who presented with abdominal pain and constipation for the past 4 days and admitted for ileus.   Ileus S/p recent LOA for SBO during prior admission, with resolution of symptoms. Pt presents due to no BM x 4 days and increasing abdominal distention. CT abd negative for obstruction. Gen surg consulted, recommending NPO status, IV fluids, and pain management with IV dilaudid 0.5, with recommendations for NG tube if pt has N/V. Likely ileus or early SBO. On exam, there is tenderness to palpation on upper quadrants, and abdomen is distended. Advanced diet to clear liquid one day 2, and full liquid on day 3. Nutrition consulted.    Ascites Patient with large volume ascites throughout abdomen and pelvis with significant increase since prior CT abdomen on 05/20/2021. Elective diagnostic paracentesis with SAAG score < 1.1, concerning for malignancy, given renal mass concerning for RCC and hx of colon cancer. Body fluid cell count elevated 6578 with neutrophilic predominance. Pt remains afebrile, no acute abdomen on exam and improving leukocytosis. Concern for bacterial peritonitis vs malignant ascites. Started broad spectrum abx. Cytology showing *** -GI consulted    Hypertension BP stable at this time. Patient on diltiazem at home.  -IV hydralazine q4h prn SBP>180 give 10 mg, or SBP>210 give 20 mg -Once no longer n.p.o., restart p.o. diltiazem   ESRD HD T/Th/Sat.  Electrolytes stable. Anemia secondary to ESRD, being managed by nephrology.  -HD per nephrology    Renal mass Splenic mass Hypodense right renal mass measuring 3.4 cm, seen on prior CT imaging and being followed by nephrology on outpatient basis. Patient with hypodense lesions within upper spleen that was 1.8 cm in diameter. Likely to represent a cyst. Also seen was on the inferior margin of the spleen a subtle  hypodense foci not seen on previous exams and suspicious for splenic infarct versus neoplastic lesion. Possibly also secondary to edema and surrounding ascites. -Nephrology recommending outpatient urology consult with imaging outpatient.

## 2021-06-12 NOTE — Progress Notes (Signed)
Subjective: No acute overnight events. Patient was seen at bedside during rounds today. Pt reports that he wants to go to the bathroom. Pt is difficult to talk with and is annoyed. Pt denies abdomina pain, as stating it is controlled with the pain medications. No other complains or concerns at this time.  Nurse reports he had a small BM.    Objective:  Vital signs in last 24 hours: Vitals:   06/12/21 0355 06/12/21 0414 06/12/21 0808 06/12/21 1146  BP:  (!) 155/101 (!) 177/100 (!) 181/117  Pulse:  81 91 (!) 101  Resp:  18 17 18   Temp: 98 F (36.7 C) (!) 97.5 F (36.4 C) 98 F (36.7 C)   TempSrc: Oral Oral Oral   SpO2:  94% 95% 93%   Constitutional: alert, cachectic-appearing, appears to be in mild distress HENT: normocephalic, atraumatic, mucous membranes moist Cardiovascular: regular rate and rhythm, no m/r/g Pulmonary/Chest: normal work of breathing on room air, lungs clear to auscultation bilaterally Abdominal: soft, mildly tender to palpation in upper quadrants, mildly distended MSK: normal bulk and tone Neurological: alert & oriented x 3  Skin: warm and dry Psych: normal behavior, normal affect   Assessment/Plan:  Active Problems:   Ileus ([place])  Gatlyn Lipari is a 60 y.o. with pertinent PMHx of ESRD on HD T/Th/Sat, colon cancer s/p resection, HFpEF, HTN and polysubstance use  who presented with abdominal pain and constipation for the past 4 days and admitted for ileus.   Ileus S/p recent LOA for SBO during prior admission, with resolution of symptoms. Pt has had no BM x 4 days, increasing abdominal distention, and has not eaten in about 1 week. CT abd negative for obstruction. Gen surg consulted, recommending NPO status, IV fluids, and pain management, with recommendations for NG tube if pt has N/V. Likely ileus or early SBO. On exam, there is tenderness to palpation on upper quadrants, and abdomen is distended. He had a small BM today. Later complains that pain not  managed with q6 dilaudid.  -NPO status  -If vomiting, will order NG tube -continue to monitor for flatus and BM -Pain management with IV Dilaudid 0.5 mg q4h prn -bisacodyl suppository   -Nutrition consult   Ascites Patient with large volume ascites throughout abdomen and pelvis with significant increase since prior CT abdomen on 05/20/2021. Likely due to ESRD/hypoalbuminemia but considering increased volume, elective diagnostic paracentesis could be beneficial. Pt remains afebrile, no acute abdomen on exam, and no leukocytosis; bacterial peritonitis unlikely. -Will consider diagnostic paracentesis -Will consider starting abx if pt becomes febrile or develops leukocytosis.    Hypertension BP stable at this time. Patient on diltiazem at home.  -IV hydralazine q4h prn SBP>180 give 10 mg, or SBP>210 give 20 mg -Once no longer n.p.o., restart p.o. diltiazem  ESRD HD T/Th/Sat. Pt completed a full session of HD on 8/27, next due on Tuesday. Electrolytes stable. Anemia secondary to ESRD.  -Consult nephrology tomorrow -HD per nephrology   Renal mass Splenic mass Hypodense right renal mass measuring 3.4 cm, seen on prior CT imaging and being followed by nephrology on outpatient basis. Patient with hypodense lesions within upper spleen that was 1.8 cm in diameter. Likely to represent a cyst. Also seen was on the inferior margin of the spleen a subtle hypodense foci not seen on previous exams and suspicious for splenic infarct versus neoplastic lesion. Possibly also secondary to edema and surrounding ascites. -Follow-up nephrology -Consider repeat imaging once ascites is resolved  Best Practice:  Diet: NPO IVF: none  VTE: Enoxaparin 30  Code: Full  Signature: Lajean Manes, MD  Internal Medicine Resident, PGY-1 [facility]  Pager: [phone] After 5pm on weekdays and 1pm on weekends: On Call pager [phone]

## 2021-06-12 NOTE — Consult Note (Signed)
Reason for Consult/Chief Complaint: ileus Consultant: Tegeler, MD  Timothy [name] is an 60 y.o. male.   HPI: 42M who presents to the ED with a three day history of abdominal pain. Localizes pain to the RLQ. No associated n/v, but reports last BM the day before the pain started. Denies f/c. S/p exlap adhesiolysis 8/8 by Dr. Donne Hazel for SBO. At the time of discharge 8/12, was tolerating a diet and having BMs, continued to have BMs at home. Last HD session 8/27.   Past Medical History:  Diagnosis Date   Anemia of chronic kidney failure    BPH (benign prostatic hyperplasia)    Colon cancer ([place]) 2014   End stage renal disease on dialysis ([place]) 2017   Hypertension    Hypertensive heart disease with chronic diastolic congestive heart failure ([place]) 07/17/2016   Polysubstance abuse ([place])    History of heroin and marijuana use    Past Surgical History:  Procedure Laterality Date   ABDOMINAL SURGERY     AV FISTULA PLACEMENT Left 09/19/2016   Procedure: Left arm Radiocephalic ARTERIOVENOUS (AV) FISTULA CREATION;  Surgeon: Conrad [place], MD;  Location: [facility];  Service: Vascular;  Laterality: Left;   BASCILIC VEIN TRANSPOSITION Left 07/09/2017   Procedure: BRACHIOCEPHALIC FISTULA CREATION;  Surgeon: Conrad Cr[place] Or[place], MD;  Location: [facility];  Service: Vascular;  Laterality: Left;   COLON SURGERY  2014   INSERTION OF DIALYSIS CATHETER N/A 09/19/2016   Procedure: INSERTION OF TUNNELED DIALYSIS CATHETER;  Surgeon: Conrad , MD;  Location: [facility];  Service: Vascular;  Laterality: N/A;   IR GENERIC HISTORICAL  09/14/2016   IR US GUIDE VASC ACCESS RIGHT 09/14/2016 [name], DO [facility]   IR GENERIC HISTORICAL  09/14/2016   IR FLUORO GUIDE CV LINE RIGHT 09/14/2016 [name], DO [facility]   LAPAROTOMY N/A 05/23/2021   Procedure: EXPLORATORY LAPAROTOMY LYSIS ADHESIONS;  Surgeon: [name], MD;  Location: [facility];  Service: General;  Laterality: N/A;   ORIF TIBIA PLATEAU Left  01/15/2018   Procedure: OPEN REDUCTION INTERNAL FIXATION (ORIF) TIBIAL PLATEAU;  Surgeon: Altamese Brownsville, MD;  Location: [facility];  Service: Orthopedics;  Laterality: Left;   TRANSTHORACIC ECHOCARDIOGRAM  07/2016    EF 60-65%, No RWMA. Mod Concentric LVH - Gr 2 DD. Severe LA dilation. PAP ~35 mmHg (mild Pulm HTN)  --> no changes noted 1 month later    Family History  Problem Relation Age of Onset   Heart failure Mother        Died at age 42.   Heart attack Mother 25   Hypertension Mother    Diabetes Mellitus II Mother    Other Father        Unknown   Kidney failure Sister        (Oldest Sister)   Other Other        Multiple siblings have started her heart disease, he is not sure of the details.   CAD Nephew     Social History:  reports that he quit smoking about 3 years ago. His smoking use included cigarettes. He smoked an average of .25 packs per day. He has never used smokeless tobacco. He reports current drug use. Frequency: 1.00 time per week. Drugs: Marijuana and Heroin. He reports that he does not drink alcohol.  Allergies:  Allergies  Allergen Reactions   No Known Allergies     Medications: I have reviewed the patient's current medications.  Results for orders placed or  performed during the hospital encounter of 06/11/21 (from the past 48 hour(s))  CBC with Differential/Platelet     Status: Abnormal   Collection Time: 06/11/21  2:27 PM  Result Value Ref Range   WBC 12.3 (H) 4.0 - 10.5 K/uL   RBC 3.52 (L) 4.22 - 5.81 MIL/uL   Hemoglobin 10.5 (L) 13.0 - 17.0 g/dL   HCT 31.4 (L) 39.0 - 52.0 %   MCV 89.2 80.0 - 100.0 fL   MCH 29.8 26.0 - 34.0 pg   MCHC 33.4 30.0 - 36.0 g/dL   RDW 14.1 11.5 - 15.5 %   Platelets 342 150 - 400 K/uL   nRBC 0.0 0.0 - 0.2 %   Neutrophils Relative % 83 %   Neutro Abs 10.1 (H) 1.7 - 7.7 K/uL   Lymphocytes Relative 4 %   Lymphs Abs 0.5 (L) 0.7 - 4.0 K/uL   Monocytes Relative 12 %   Monocytes Absolute 1.5 (H) 0.1 - 1.0 K/uL   Eosinophils  Relative 0 %   Eosinophils Absolute 0.0 0.0 - 0.5 K/uL   Basophils Relative 0 %   Basophils Absolute 0.0 0.0 - 0.1 K/uL   Immature Granulocytes 1 %   Abs Immature Granulocytes 0.11 (H) 0.00 - 0.07 K/uL    Comment: Performed at [facility], [address], [place], [place] [address]  Comprehensive metabolic panel     Status: Abnormal   Collection Time: 06/11/21  2:27 PM  Result Value Ref Range   Sodium 137 135 - 145 mmol/L   Potassium 3.7 3.5 - 5.1 mmol/L   Chloride 93 (L) 98 - 111 mmol/L   CO2 33 (H) 22 - 32 mmol/L   Glucose, Bld 116 (H) 70 - 99 mg/dL    Comment: Glucose reference range applies only to samples taken after fasting for at least 8 hours.   BUN 18 6 - 20 mg/dL   Creatinine, Ser 3.93 (H) 0.61 - 1.24 mg/dL   Calcium 8.6 (L) 8.9 - 10.3 mg/dL   Total Protein 6.4 (L) 6.5 - 8.1 g/dL   Albumin 2.4 (L) 3.5 - 5.0 g/dL   AST 18 15 - 41 U/L   ALT 9 0 - 44 U/L   Alkaline Phosphatase 59 38 - 126 U/L   Total Bilirubin 0.6 0.3 - 1.2 mg/dL   GFR, Estimated 17 (L) >60 mL/min    Comment: (NOTE) Calculated using the CKD-EPI Creatinine Equation (2021)    Anion gap 11 5 - 15    Comment: Performed at [facility], [place] [address] [place], [place] [address]  Lipase, blood     Status: None   Collection Time: 06/11/21  2:27 PM  Result Value Ref Range   Lipase 50 11 - 51 U/L    Comment: Performed at [facility], [place] [address] [place], [place] [address]  Lactic acid, plasma     Status: None   Collection Time: 06/11/21  2:27 PM  Result Value Ref Range   Lactic Acid, Venous 1.4 0.5 - 1.9 mmol/L    Comment: Performed at [facility], [place] [address], [place], [place] [address]  SARS CORONAVIRUS 2 (TAT 6-24 HRS) Nasopharyngeal Nasopharyngeal Swab     Status: None   Collection Time: 06/11/21  6:13 PM   Specimen: Nasopharyngeal Swab  Result Value Ref Range   SARS Coronavirus 2 NEGATIVE NEGATIVE    Comment: (NOTE) SARS-CoV-2 target nucleic acids are NOT  DETECTED.  The SARS-CoV-2 RNA  is generally detectable in upper and lower respiratory specimens during the acute phase of infection. Negative results do not preclude SARS-CoV-2 infection, do not rule out co-infections with other pathogens, and should not be used as the sole basis for treatment or other patient management decisions. Negative results must be combined with clinical observations, patient history, and epidemiological information. The expected result is Negative.  Fact Sheet for Patients: SugarRoll.be  Fact Sheet for Healthcare Providers: https://www.woods-mathews.com/  This test is not yet approved or cleared by the [place] FDA and  has been authorized for detection and/or diagnosis of SARS-CoV-2 by FDA under an Emergency Use Authorization (EUA). This EUA will remain  in effect (meaning this test can be used) for the duration of the COVID-19 declaration under Se ction 564(b)(1) of the Act, 21 U.S.C. section 360bbb-3(b)(1), unless the authorization is terminated or revoked sooner.  Performed at [facility], [place] [address] [place], [place] [address]     CT ABDOMEN PELVIS W CONTRAST  Result Date: 06/11/2021 CLINICAL DATA:  Evaluate for small bowel obstruction. No bowel movement or voiding for 3 days. EXAM: CT ABDOMEN AND PELVIS WITH CONTRAST TECHNIQUE: Multidetector CT imaging of the abdomen and pelvis was performed using the standard protocol following bolus administration of intravenous contrast. CONTRAST:  44mL OMNIPAQUE IOHEXOL 350 MG/ML SOLN COMPARISON:  CT abdomen dated 05/20/2021. FINDINGS: Lower chest: Dense bibasilar consolidations, increased compared to the previous exam. Hepatobiliary: No focal liver abnormality is seen. Probable gallstone within the nondistended gallbladder. Gallbladder walls cannot be evaluated due to surrounding ascites. Mild intrahepatic bile duct prominence. Pancreas: Pancreatic duct is  again dilated, measuring up to 8 mm diameter. No pancreatic mass is identified. Spleen: Hypodense lesion within the upper spleen, measuring 1.9 cm, as previously described and again favored to represent a cyst. Additional subtle hypodense foci are now seen along the inferior margin of the spleen, not convincingly seen on previous exams and therefore suspicious for splenic infarct or neoplastic lesions, possibly edema related to the surrounding ascites. Adrenals/Urinary Tract: Adrenal glands are unremarkable. Innumerable hypodense masses within the bilateral atrophic kidneys, majority favored to represent cysts. Again noted is the dominant hypodense mass within the RIGHT kidney, measuring 3.4 cm, suggesting neoplastic mass such as renal cell carcinoma. No hydronephrosis. Bladder is not seen, presumably obscured by surrounding ascites. Stomach/Bowel: No dilated large or small bowel loops. Single patulous loop of large bowel within the upper abdomen at the site of a previous surgical bowel anastomosis, but no proximal or distal bowel dilatation to suggest mechanical obstruction. Moderate amount of gas and stool within the colon. Fluid and associated air-fluid levels are seen within the nondistended small bowel, suggesting ileus. Vascular/Lymphatic: Aortic atherosclerosis. No acute-appearing vascular abnormality. No enlarged lymph nodes are seen, although characterization is limited by the large volume ascites. Reproductive: Prostate is unremarkable. Other: Large volume ascites throughout the abdomen and pelvis. No evidence of free intraperitoneal air. Musculoskeletal: No acute or suspicious osseous abnormality. IMPRESSION: 1. Large volume ascites throughout the abdomen and pelvis. This has significantly increased compared to the CT abdomen of 05/20/2021. 2. Status post RIGHT hemicolectomy. Single patulous loop of large bowel within the upper abdomen at the site of the associated surgical bowel anastomosis, but no  proximal or distal bowel dilatation to suggest mechanical obstruction. 3. Fluid and associated air-fluid levels within the nondistended small bowel, suggesting ileus. 4. Dominant hypodense mass within the RIGHT kidney measures 3.4 cm, highly suspicious for neoplastic mass such as renal cell carcinoma. 5.  Numerous additional cysts within the bilateral atrophic kidneys. 6. New subtle hypodense foci along the inferior margin of the spleen, suspicious for splenic infarcts or neoplastic/metastatic lesions, less likely parenchymal edema related to the surrounding ascites. Stable presumed cyst within the upper spleen. 7. Dense bibasilar consolidations, increased compared to the previous exam. These could represent pneumonia or aspiration. 8. Cholelithiasis. 9. Pancreatic duct dilatation, stable compared to CT abdomen of 05/20/2021. This is of uncertain etiology. Aortic Atherosclerosis (ICD10-I70.0). Electronically Signed   By: Franki Cabot M.D.   On: 06/11/2021 17:41    ROS 10 point review of systems is negative except as listed above in HPI.   Physical Exam Blood pressure (!) 154/97, pulse 85, temperature 98.5 F (36.9 C), temperature source Oral, resp. rate 20, SpO2 93 %. Constitutional: well-developed, thin HEENT: pupils equal, round, reactive to light, 35mm b/l, moist conjunctiva, external inspection of ears and nose normal, hearing intact Oropharynx: normal oropharyngeal mucosa, poor dentition Neck: no thyromegaly, trachea midline, no midline cervical tenderness to palpation Chest: breath sounds equal bilaterally, normal respiratory effort, no midline or lateral chest wall tenderness to palpation/deformity Abdomen: soft, RLQ TTP, distended, dullness to percussion, midline incision healing well, no bruising, no hepatosplenomegaly GU: no blood at urethral meatus of penis, no scrotal masses or abnormality  Back: no wounds, no thoracic/lumbar spine tenderness to palpation, no thoracic/lumbar spine  stepoffs Rectal: deferred Extremities: 2+ radial and pedal pulses bilaterally, intact motor and sensation bilateral UE and LE, no peripheral edema MSK: unable to assess gait/station, no clubbing/cyanosis of fingers/toes, normal ROM of all four extremities Skin: warm, dry, no rashes Psych: normal memory, normal mood/affect    Assessment/Plan: 62M with ileus and ascites  CT images reviewed. Despite recent surgery, I do not think this is a complication from surgery as he was having BMs prior to discharge and while at home, as well as tolerating a diet. He did not have any documented serosal injury or bowel resection at the time of surgery and his clinical picture does not support a delayed injury. Given the findings on his CT suggestive of malignancy and his ascites, recommend cytologic evaluation of this fluid. Expect that his clinical picture is either due to ileus or recurrent early SBO, although I favor ileus. As he is not vomiting, I would not recommend NGT placement at this time, however if he becomes nauseous or begins to vomit, would recommend NGT placement then. Recommend correction of electrolytes to K>3.9, PO4>2.9, and mag>1.9 and daily suppositories. Consider early TPN if resolution not occurring as his albumin is 2.4.    Jesusita Oka, MD General and [facility]

## 2021-06-13 DIAGNOSIS — E43 Unspecified severe protein-calorie malnutrition: Secondary | ICD-10-CM | POA: Diagnosis not present

## 2021-06-13 DIAGNOSIS — K567 Ileus, unspecified: Secondary | ICD-10-CM | POA: Diagnosis not present

## 2021-06-13 LAB — CBC WITH DIFFERENTIAL/PLATELET
Abs Immature Granulocytes: 0.12 10*3/uL — ABNORMAL HIGH (ref 0.00–0.07)
Basophils Absolute: 0 10*3/uL (ref 0.0–0.1)
Basophils Relative: 0 %
Eosinophils Absolute: 0 10*3/uL (ref 0.0–0.5)
Eosinophils Relative: 0 %
HCT: 34.7 % — ABNORMAL LOW (ref 39.0–52.0)
Hemoglobin: 12.1 g/dL — ABNORMAL LOW (ref 13.0–17.0)
Immature Granulocytes: 1 %
Lymphocytes Relative: 4 %
Lymphs Abs: 0.5 10*3/uL — ABNORMAL LOW (ref 0.7–4.0)
MCH: 30 pg (ref 26.0–34.0)
MCHC: 34.9 g/dL (ref 30.0–36.0)
MCV: 85.9 fL (ref 80.0–100.0)
Monocytes Absolute: 1.3 10*3/uL — ABNORMAL HIGH (ref 0.1–1.0)
Monocytes Relative: 9 %
Neutro Abs: 12.3 10*3/uL — ABNORMAL HIGH (ref 1.7–7.7)
Neutrophils Relative %: 86 %
Platelets: 463 10*3/uL — ABNORMAL HIGH (ref 150–400)
RBC: 4.04 MIL/uL — ABNORMAL LOW (ref 4.22–5.81)
RDW: 13.8 % (ref 11.5–15.5)
WBC: 14.3 10*3/uL — ABNORMAL HIGH (ref 4.0–10.5)
nRBC: 0 % (ref 0.0–0.2)

## 2021-06-13 LAB — GRAM STAIN

## 2021-06-13 LAB — RENAL FUNCTION PANEL
Albumin: 2.1 g/dL — ABNORMAL LOW (ref 3.5–5.0)
Anion gap: 15 (ref 5–15)
BUN: 54 mg/dL — ABNORMAL HIGH (ref 6–20)
CO2: 30 mmol/L (ref 22–32)
Calcium: 9 mg/dL (ref 8.9–10.3)
Chloride: 89 mmol/L — ABNORMAL LOW (ref 98–111)
Creatinine, Ser: 6.43 mg/dL — ABNORMAL HIGH (ref 0.61–1.24)
GFR, Estimated: 9 mL/min — ABNORMAL LOW (ref >60)
Glucose, Bld: 83 mg/dL (ref 70–99)
Phosphorus: 9.1 mg/dL — ABNORMAL HIGH (ref 2.5–4.6)
Potassium: 5.7 mmol/L — ABNORMAL HIGH (ref 3.5–5.1)
Sodium: 134 mmol/L — ABNORMAL LOW (ref 135–145)

## 2021-06-13 LAB — BODY FLUID CELL COUNT WITH DIFFERENTIAL
Eos, Fluid: 0 %
Lymphs, Fluid: 15 %
Monocyte-Macrophage-Serous Fluid: 5 % — ABNORMAL LOW (ref 50–90)
Neutrophil Count, Fluid: 80 % — ABNORMAL HIGH (ref 0–25)
Total Nucleated Cell Count, Fluid: 3600 cu mm — ABNORMAL HIGH (ref 0–1000)

## 2021-06-13 LAB — PROTEIN, PLEURAL OR PERITONEAL FLUID: Total protein, fluid: 4 g/dL

## 2021-06-13 LAB — GLUCOSE, PLEURAL OR PERITONEAL FLUID: Glucose, Fluid: 78 mg/dL

## 2021-06-13 LAB — ALBUMIN, PLEURAL OR PERITONEAL FLUID: Albumin, Fluid: 1.7 g/dL

## 2021-06-13 MED ORDER — OXYCODONE HCL 5 MG PO TABS
10.0000 mg | ORAL_TABLET | Freq: Once | ORAL | Status: AC
  Administered 2021-06-13: 10 mg via ORAL
  Filled 2021-06-13: qty 2

## 2021-06-13 MED ORDER — RENA-VITE PO TABS
1.0000 | ORAL_TABLET | Freq: Every day | ORAL | Status: DC
  Administered 2021-06-13 – 2021-06-24 (×12): 1 via ORAL
  Filled 2021-06-13 (×12): qty 1

## 2021-06-13 MED ORDER — DILTIAZEM HCL ER COATED BEADS 180 MG PO CP24
360.0000 mg | ORAL_CAPSULE | Freq: Every day | ORAL | Status: DC
  Administered 2021-06-13 – 2021-06-25 (×12): 360 mg via ORAL
  Filled 2021-06-13 (×12): qty 2

## 2021-06-13 MED ORDER — BOOST / RESOURCE BREEZE PO LIQD CUSTOM
1.0000 | Freq: Three times a day (TID) | ORAL | Status: DC
  Administered 2021-06-13: 1 via ORAL

## 2021-06-13 MED ORDER — PROSOURCE PLUS PO LIQD
30.0000 mL | Freq: Two times a day (BID) | ORAL | Status: DC
  Administered 2021-06-13 – 2021-06-15 (×2): 30 mL via ORAL
  Filled 2021-06-13 (×9): qty 30

## 2021-06-13 MED ORDER — SODIUM CHLORIDE 0.9 % IV SOLN
125.0000 mg | INTRAVENOUS | Status: DC
  Administered 2021-06-14 – 2021-06-16 (×2): 125 mg via INTRAVENOUS
  Filled 2021-06-13 (×3): qty 10

## 2021-06-13 MED ORDER — SODIUM CHLORIDE 0.9 % IV SOLN: 100.0000 mg | INTRAVENOUS | Status: DC

## 2021-06-13 MED ORDER — SEVELAMER CARBONATE 800 MG PO TABS
1600.0000 mg | ORAL_TABLET | Freq: Three times a day (TID) | ORAL | Status: DC
  Administered 2021-06-13 (×2): 1600 mg via ORAL
  Filled 2021-06-13 (×2): qty 2

## 2021-06-13 NOTE — Consult Note (Signed)
Timothy [name] Renal Consultation Note  Indication for Consultation:  Management of ESRD/hemodialysis; anemia, hypertension/volume and secondary hyperparathyroidism  HPI: Timothy [name] is a 60 y.o. male  HO ESRD d/t  malignat HTN HD [facility] November 2017 admit HTN urgency/uremia/pulm edema, reported history of colon cancer 2014 with resection in [place] polysubstance abuse - tobacco/alcohol/heroin medical noncompliance  Hep C antibody+.  Recent MH H admit 8/04 -12/22 exploratory lap adhesiolysis 8/8 /22 by Dr. Donne Hazel for SBO.  Now admitted with ileus, reports since discharge bright red blood is bowel movements, having progressive pain past 4 days.  Has not missed hemodialysis last dialysis Saturday 06/11/21 we are consulted for hemodialysis/ESRD needs. Seen in room complains of abdominal bloating and pain, pain meds have been working.  Denies any shortness or chest pain.  Next schedule dialysis tomorrow schedule      Past Medical History:  Diagnosis Date   Anemia of chronic kidney failure    BPH (benign prostatic hyperplasia)    Colon cancer ([place]) 2014   End stage renal disease on dialysis ([place]) 2017   Hypertension    Hypertensive heart disease with chronic diastolic congestive heart failure ([place]) 07/17/2016   Polysubstance abuse ([place])    History of heroin and marijuana use    Past Surgical History:  Procedure Laterality Date   ABDOMINAL SURGERY     AV FISTULA PLACEMENT Left 09/19/2016   Procedure: Left arm Radiocephalic ARTERIOVENOUS (AV) FISTULA CREATION;  Surgeon: Conrad Grandview, MD;  Location: [place];  Service: Vascular;  Laterality: Left;   [place] Left 07/09/2017   Procedure: BRACHIOCEPHALIC FISTULA CREATION;  Surgeon: Conrad [place], MD;  Location: [place];  Service: Vascular;  Laterality: Left;   COLON SURGERY  2014   INSERTION OF DIALYSIS CATHETER N/A 09/19/2016   Procedure: INSERTION OF TUNNELED DIALYSIS CATHETER;  Surgeon: Conrad Glencoe, MD;   Location: [place];  Service: Vascular;  Laterality: N/A;   IR GENERIC HISTORICAL  09/14/2016   IR US GUIDE VASC ACCESS RIGHT 09/14/2016 [name], DO [facility]   IR GENERIC HISTORICAL  09/14/2016   IR FLUORO GUIDE CV LINE RIGHT 09/14/2016 [name], DO [facility]   LAPAROTOMY N/A 05/23/2021   Procedure: EXPLORATORY LAPAROTOMY LYSIS ADHESIONS;  Surgeon: [name], MD;  Location: [place];  Service: General;  Laterality: N/A;   ORIF TIBIA PLATEAU Left 01/15/2018   Procedure: OPEN REDUCTION INTERNAL FIXATION (ORIF) TIBIAL PLATEAU;  Surgeon: [name], MD;  Location: [place];  Service: Orthopedics;  Laterality: Left;   TRANSTHORACIC ECHOCARDIOGRAM  07/2016    EF 60-65%, No RWMA. Mod Concentric LVH - Gr 2 DD. Severe LA dilation. PAP ~35 mmHg (mild Pulm HTN)  --> no changes noted 1 month later      Family History  Problem Relation Age of Onset   Heart failure Mother        Died at age 46.   Heart attack Mother 33   Hypertension Mother    Diabetes Mellitus II Mother    Other Father        Unknown   Kidney failure Sister        (Oldest Sister)   Other Other        Multiple siblings have started her heart disease, he is not sure of the details.   CAD Nephew       reports that he quit smoking about 3 years ago. His smoking use included cigarettes. He smoked an average of .25 packs  per day. He has never used smokeless tobacco. He reports current drug use. Frequency: 1.00 time per week. Drugs: Marijuana and Heroin. He reports that he does not drink alcohol.   Allergies  Allergen Reactions   No Known Allergies     Prior to Admission medications   Medication Sig Start Date End Date Taking? Authorizing Provider  diltiazem (CARDIZEM CD) 360 MG 24 hr capsule Take 360 mg by mouth daily. 06/19/17  Yes [provider]  lidocaine-prilocaine (EMLA) cream Apply 1 application topically See admin instructions. Tuesday,thursday,Saturday prior to dialysis 05/12/21  Yes [provider]  sevelamer carbonate (RENVELA) 800 MG tablet Take 2 tablets by mouth with breakfast, with lunch, and with evening meal. 05/12/21  Yes [provider]  acetaminophen (TYLENOL) 500 MG tablet Take 2 tablets (1,000 mg total) by mouth every 6 (six) hours as needed for mild pain or moderate pain. Patient not taking: Reported on 06/11/2021 05/27/21   Lajean Manes, MD  docusate sodium (COLACE) 250 MG capsule Take 1 capsule (250 mg total) by mouth daily. Patient not taking: No sig reported 05/19/19   McDonald, Mia A, PA-C  lactulose (CEPHULAC) 10 g packet Take 1 packet (10 g total) by mouth 3 (three) times daily. Patient not taking: No sig reported 05/19/19   Joline Maxcy A, PA-C      Results for orders placed or performed during the hospital encounter of 06/11/21 (from the past 48 hour(s))  CBC with Differential/Platelet     Status: Abnormal   Collection Time: 06/11/21  2:27 PM  Result Value Ref Range   WBC 12.3 (H) 4.0 - 10.5 K/uL   RBC 3.52 (L) 4.22 - 5.81 MIL/uL   Hemoglobin 10.5 (L) 13.0 - 17.0 g/dL   HCT 31.4 (L) 39.0 - 52.0 %   MCV 89.2 80.0 - 100.0 fL   MCH 29.8 26.0 - 34.0 pg   MCHC 33.4 30.0 - 36.0 g/dL   RDW 14.1 11.5 - 15.5 %   Platelets 342 150 - 400 K/uL   nRBC 0.0 0.0 - 0.2 %   Neutrophils Relative % 83 %   Neutro Abs 10.1 (H) 1.7 - 7.7 K/uL   Lymphocytes Relative 4 %   Lymphs Abs 0.5 (L) 0.7 - 4.0 K/uL   Monocytes Relative 12 %   Monocytes Absolute 1.5 (H) 0.1 - 1.0 K/uL   Eosinophils Relative 0 %   Eosinophils Absolute 0.0 0.0 - 0.5 K/uL   Basophils Relative 0 %   Basophils Absolute 0.0 0.0 - 0.1 K/uL   Immature Granulocytes 1 %   Abs Immature Granulocytes 0.11 (H) 0.00 - 0.07 K/uL    Comment: Performed at [facility], [address], [place], [place] [address]  Comprehensive metabolic panel     Status: Abnormal   Collection Time: 06/11/21  2:27 PM  Result Value Ref Range   Sodium 137 135 - 145 mmol/L   Potassium 3.7 3.5 - 5.1 mmol/L    Chloride 93 (L) 98 - 111 mmol/L   CO2 33 (H) 22 - 32 mmol/L   Glucose, Bld 116 (H) 70 - 99 mg/dL    Comment: Glucose reference range applies only to samples taken after fasting for at least 8 hours.   BUN 18 6 - 20 mg/dL   Creatinine, Ser 3.93 (H) 0.61 - 1.24 mg/dL   Calcium 8.6 (L) 8.9 - 10.3 mg/dL   Total Protein 6.4 (L) 6.5 - 8.1 g/dL   Albumin 2.4 (L) 3.5 -  5.0 g/dL   AST 18 15 - 41 U/L   ALT 9 0 - 44 U/L   Alkaline Phosphatase 59 38 - 126 U/L   Total Bilirubin 0.6 0.3 - 1.2 mg/dL   GFR, Estimated 17 (L) >60 mL/min    Comment: (NOTE) Calculated using the CKD-EPI Creatinine Equation (2021)    Anion gap 11 5 - 15    Comment: Performed at [facility] [address] [place], [place] [address]  Lipase, blood     Status: None   Collection Time: 06/11/21  2:27 PM  Result Value Ref Range   Lipase 50 11 - 51 U/L    Comment: Performed at [facility], [place] [address] [place], [place] [address]  Lactic acid, plasma     Status: None   Collection Time: 06/11/21  2:27 PM  Result Value Ref Range   Lactic Acid, Venous 1.4 0.5 - 1.9 mmol/L    Comment: Performed at [facility], [place] [address] [place], [place] [address]  SARS CORONAVIRUS 2 (TAT 6-24 HRS) Nasopharyngeal Nasopharyngeal Swab     Status: None   Collection Time: 06/11/21  6:13 PM   Specimen: Nasopharyngeal Swab  Result Value Ref Range   SARS Coronavirus 2 NEGATIVE NEGATIVE    Comment: (NOTE) SARS-CoV-2 target nucleic acids are NOT DETECTED.  The SARS-CoV-2 RNA is generally detectable in upper and lower respiratory specimens during the acute phase of infection. Negative results do not preclude SARS-CoV-2 infection, do not rule out co-infections with other pathogens, and should not be used as the sole basis for treatment or other patient management decisions. Negative results must be combined with clinical observations, patient history, and epidemiological information. The expected result is  Negative.  Fact Sheet for Patients: SugarRoll.be  Fact Sheet for Healthcare Providers: https://www.woods-mathews.com/  This test is not yet approved or cleared by the [place] FDA and  has been authorized for detection and/or diagnosis of SARS-CoV-2 by FDA under an Emergency Use Authorization (EUA). This EUA will remain  in effect (meaning this test can be used) for the duration of the COVID-19 declaration under Se ction 564(b)(1) of the Act, 21 U.S.C. section 360bbb-3(b)(1), unless the authorization is terminated or revoked sooner.  Performed at [facility], [place] [address], [place], [place] [address]   Renal function panel     Status: Abnormal   Collection Time: 06/12/21  9:18 AM  Result Value Ref Range   Sodium 137 135 - 145 mmol/L   Potassium 4.4 3.5 - 5.1 mmol/L   Chloride 94 (L) 98 - 111 mmol/L   CO2 31 22 - 32 mmol/L   Glucose, Bld 68 (L) 70 - 99 mg/dL    Comment: Glucose reference range applies only to samples taken after fasting for at least 8 hours.   BUN 30 (H) 6 - 20 mg/dL   Creatinine, Ser 4.92 (H) 0.61 - 1.24 mg/dL   Calcium 8.8 (L) 8.9 - 10.3 mg/dL   Phosphorus 6.3 (H) 2.5 - 4.6 mg/dL   Albumin 2.1 (L) 3.5 - 5.0 g/dL   GFR, Estimated 13 (L) >60 mL/min    Comment: (NOTE) Calculated using the CKD-EPI Creatinine Equation (2021)    Anion gap 12 5 - 15    Comment: Performed at [facility] [address] [place], [place] [address]  CBC with Differential/Platelet     Status: Abnormal   Collection Time: 06/12/21  9:18 AM  Result Value Ref  Range   WBC 15.2 (H) 4.0 - 10.5 K/uL   RBC 3.91 (L) 4.22 - 5.81 MIL/uL   Hemoglobin 11.6 (L) 13.0 - 17.0 g/dL   HCT 34.3 (L) 39.0 - 52.0 %   MCV 87.7 80.0 - 100.0 fL   MCH 29.7 26.0 - 34.0 pg   MCHC 33.8 30.0 - 36.0 g/dL   RDW 14.2 11.5 - 15.5 %   Platelets 369 150 - 400 K/uL   nRBC 0.0 0.0 - 0.2 %   Neutrophils Relative % 86 %   Neutro Abs 13.1 (H) 1.7 - 7.7 K/uL    Lymphocytes Relative 4 %   Lymphs Abs 0.7 0.7 - 4.0 K/uL   Monocytes Relative 9 %   Monocytes Absolute 1.3 (H) 0.1 - 1.0 K/uL   Eosinophils Relative 0 %   Eosinophils Absolute 0.0 0.0 - 0.5 K/uL   Basophils Relative 0 %   Basophils Absolute 0.0 0.0 - 0.1 K/uL   Immature Granulocytes 1 %   Abs Immature Granulocytes 0.14 (H) 0.00 - 0.07 K/uL    Comment: Performed at [facility], [address], [place], [place] [address]  Magnesium     Status: None   Collection Time: 06/12/21  9:18 AM  Result Value Ref Range   Magnesium 2.2 1.7 - 2.4 mg/dL    Comment: Performed at [facility] [address], [place], [place] [address]    ROS: See HPI  Physical Exam: Vitals:   06/13/21 0400 06/13/21 0808  BP: (!) 151/95 (!) 152/95  Pulse: 97 96  Resp: 17 18  Temp: 97.8 F (36.6 C) 97.6 F (36.4 C)  SpO2: 93% 95%     General: Thin alert male chronically ill in discomfort but NAD HEENT: [place], PERRLA, EOMI, nonicteric, MM Neck: No JVD Heart: RRR, no MRG Lungs: CTA nonlabored breathing Abdomen: Slightly hyperactive bowel sounds, distended, generalized tenderness Extremities: No pedal edema Skin: Warm dry no overt rash or ulcers appreciated Neuro: Alert O x3 moves all extremities no acute focal deficits appreciated Dialysis Access: Positive bruit LUA aVF   OP Dialysis Orders: [facility] 3 hr 45 min 2K, 2 CA bath  UF P4  EDW 65(but last 2 treatments 64.3 post weights) No heparin Venofer 100x3 Mircera every 4 weeks 50 MCG not given recently as OP  Assessment/Plan Ileus= work-up with [facility] and admit team ESRD -HD TTS Hypertension/volume  -does not appear volume overloaded, BP elevated with some abdominal pain, outpatient meds diltiazem CD 360 daily IV hydralazine as needed Anemia of ESRD-Hgb 12.1, no ESA needs we will continue Venofer load x3 Metabolic bone disease -no vitamin D, noted phosphorus 9.1 corrected calcium over 10 no vitamin D, phosphate binder when takes p.o. with  meals was Renvela(not a good option with history of SBO will discuss with patient other binders when taking p.o.'s) History of renal mass/splenic mass= renal mass of the right 3.4 cm seen on prior CT imaging follow-up as outpatient, splenic mass 1.8 cm  Nutrition -currently n.p.o. with ileus  Ernest Haber, PA-C [place] [phone] 06/13/2021, 9:11 AM

## 2021-06-13 NOTE — Care Management (Signed)
Consult to assist patient with purchasing over the counter medication. TOC team unable to assist with over the counter medication. Patient has insurance for prescription medications.

## 2021-06-13 NOTE — Procedures (Signed)
Paracentesis Procedure Note  Indications:  right on left  Procedure Details  Informed consent was obtained after explanation of the risks and benefits of the procedure, refer to the consent documentation.  Time-out was performed immediately prior to the procedure.  The head of the bed was placed 30-45 degrees above level and the meniscus of the ascites was evaluated by bedside ultrasound and a large area of ascitic fluid was identified in the right mid-lower quadrant.  Local anesthesia with 1 percent lidocaine was introduced subcutaneously then deep to the skin until the parietal peritoneum was anesthetized. A parcentesis needle was introduced into this site until ascitic fluid was encountered.  Ascitic fluid and the needle were removed with minimal bleeding.  A sterile bandage was placed after holding pressure.   Findings: 1854mL of amber ascites fluid was obtained.  The ascites fluid was sent for cell count and differential, culture, protein/albumin, and glucose.        Condition:   The patient tolerated the procedure well and remains in the same condition as pre-procedure.  Complications: None; patient tolerated the procedure well.

## 2021-06-13 NOTE — Progress Notes (Signed)
Subjective/Chief Complaint: Patient is frustrated by the lack of progress Paracentesis has not yet been performed Had bowel movements yesterday.   Feels "full" in his abdomen, but no nausea or vomiting   Objective: Vital signs in last 24 hours: Temp:  [97.6 F (36.4 C)-98.5 F (36.9 C)] 97.6 F (36.4 C) (08/29 0808) Pulse Rate:  [96-101] 96 (08/29 0808) Resp:  [16-18] 18 (08/29 0808) BP: (151-181)/(95-119) 152/95 (08/29 0808) SpO2:  [93 %-96 %] 95 % (08/29 0808) Last BM Date: 06/12/21  Intake/Output from previous day: No intake/output data recorded. Intake/Output this shift: No intake/output data recorded.  General appearance: alert, cooperative, and no distress GI: Distended, dull to percussion, midline incision with no sign of infection; mild generalized tenderness  Lab Results:  Recent Labs    06/11/21 1427 06/12/21 0918  WBC 12.3* 15.2*  HGB 10.5* 11.6*  HCT 31.4* 34.3*  PLT 342 369   BMET Recent Labs    06/11/21 1427 06/12/21 0918  NA 137 137  K 3.7 4.4  CL 93* 94*  CO2 33* 31  GLUCOSE 116* 68*  BUN 18 30*  CREATININE 3.93* 4.92*  CALCIUM 8.6* 8.8*   PT/INR No results for input(s): LABPROT, INR in the last 72 hours. ABG No results for input(s): PHART, HCO3 in the last 72 hours.  Invalid input(s): PCO2, PO2  Studies/Results: CT ABDOMEN PELVIS W CONTRAST  Result Date: 06/11/2021 CLINICAL DATA:  Evaluate for small bowel obstruction. No bowel movement or voiding for 3 days. EXAM: CT ABDOMEN AND PELVIS WITH CONTRAST TECHNIQUE: Multidetector CT imaging of the abdomen and pelvis was performed using the standard protocol following bolus administration of intravenous contrast. CONTRAST:  30mL OMNIPAQUE IOHEXOL 350 MG/ML SOLN COMPARISON:  CT abdomen dated 05/20/2021. FINDINGS: Lower chest: Dense bibasilar consolidations, increased compared to the previous exam. Hepatobiliary: No focal liver abnormality is seen. Probable gallstone within the nondistended  gallbladder. Gallbladder walls cannot be evaluated due to surrounding ascites. Mild intrahepatic bile duct prominence. Pancreas: Pancreatic duct is again dilated, measuring up to 8 mm diameter. No pancreatic mass is identified. Spleen: Hypodense lesion within the upper spleen, measuring 1.9 cm, as previously described and again favored to represent a cyst. Additional subtle hypodense foci are now seen along the inferior margin of the spleen, not convincingly seen on previous exams and therefore suspicious for splenic infarct or neoplastic lesions, possibly edema related to the surrounding ascites. Adrenals/Urinary Tract: Adrenal glands are unremarkable. Innumerable hypodense masses within the bilateral atrophic kidneys, majority favored to represent cysts. Again noted is the dominant hypodense mass within the RIGHT kidney, measuring 3.4 cm, suggesting neoplastic mass such as renal cell carcinoma. No hydronephrosis. Bladder is not seen, presumably obscured by surrounding ascites. Stomach/Bowel: No dilated large or small bowel loops. Single patulous loop of large bowel within the upper abdomen at the site of a previous surgical bowel anastomosis, but no proximal or distal bowel dilatation to suggest mechanical obstruction. Moderate amount of gas and stool within the colon. Fluid and associated air-fluid levels are seen within the nondistended small bowel, suggesting ileus. Vascular/Lymphatic: Aortic atherosclerosis. No acute-appearing vascular abnormality. No enlarged lymph nodes are seen, although characterization is limited by the large volume ascites. Reproductive: Prostate is unremarkable. Other: Large volume ascites throughout the abdomen and pelvis. No evidence of free intraperitoneal air. Musculoskeletal: No acute or suspicious osseous abnormality. IMPRESSION: 1. Large volume ascites throughout the abdomen and pelvis. This has significantly increased compared to the CT abdomen of 05/20/2021. 2. Status post  RIGHT  hemicolectomy. Single patulous loop of large bowel within the upper abdomen at the site of the associated surgical bowel anastomosis, but no proximal or distal bowel dilatation to suggest mechanical obstruction. 3. Fluid and associated air-fluid levels within the nondistended small bowel, suggesting ileus. 4. Dominant hypodense mass within the RIGHT kidney measures 3.4 cm, highly suspicious for neoplastic mass such as renal cell carcinoma. 5. Numerous additional cysts within the bilateral atrophic kidneys. 6. New subtle hypodense foci along the inferior margin of the spleen, suspicious for splenic infarcts or neoplastic/metastatic lesions, less likely parenchymal edema related to the surrounding ascites. Stable presumed cyst within the upper spleen. 7. Dense bibasilar consolidations, increased compared to the previous exam. These could represent pneumonia or aspiration. 8. Cholelithiasis. 9. Pancreatic duct dilatation, stable compared to CT abdomen of 05/20/2021. This is of uncertain etiology. Aortic Atherosclerosis (ICD10-I70.0). Electronically Signed   By: Franki Cabot M.D.   On: 06/11/2021 17:41    Anti-infectives: Anti-infectives (From admission, onward)    None       Assessment/Plan: s/p exploratory laparotomy/ adhesiolysis 05/23/21 for small bowel obstruction - Dr. Donne Hazel S/p previous right hemicolectomy for colon cancer Large volume ascites No sign of small bowel obstruction or anastomotic leak ESRD Renal mass 3.4 cm  Recs:  Diagnostic/ therapeutic paracentesis May initiate diet after procedure - no sign of bowel obstruction Will follow along with you, but no sign of acute surgical problems.   LOS: 1 day    Maia Petties 06/13/2021

## 2021-06-13 NOTE — Progress Notes (Signed)
Initial Nutrition Assessment  DOCUMENTATION CODES:   Severe malnutrition in context of chronic illness  INTERVENTION:  -Boost Breeze po TID, each supplement provides 250 kcal and 9 grams of protein -PROSource PLUS PO 30mls BID, each supplement provides 100 kcals and 15 grams of protein -renal mvi daily  NUTRITION DIAGNOSIS:   Severe Malnutrition related to chronic illness (ESRD on HD) as evidenced by severe fat depletion, severe muscle depletion.  GOAL:   Patient will meet greater than or equal to 90% of their needs  MONITOR:   PO intake, Supplement acceptance, Diet advancement, Weight trends, Labs, I & O's  REASON FOR ASSESSMENT:   Consult Assessment of nutrition requirement/status  ASSESSMENT:   Pt with a PMH significant for ESRD on HD, colon Ca s/p resection, HFpEF, HTN, and polysubstance abuse admitted with an ileus.  Pt did not eat for ~5 days PTA 2/2 constipation and poor appetite. Pt endorses ongoing abdominal pain, but reports it is mostly controlled with pain medication. Pt denies N/V. Diet advance to clear liquid diet, will provide oral nutrition supplements. Reports having 2 small bowel movements yesterday and 1 small BM today.   Medications: dulcolax, renvela Labs: Na 134 (L), K+ 5.7 (H), BUN 54 (H), Cr 6.43 (H), PO4 9.1 (H)   No UOP documented x24 hours I/O: +1.3L since admit  EDW 65 kg No updated weight for this admission, last weight taken 05/27/21, 66.3 kg  NUTRITION - FOCUSED PHYSICAL EXAM: Flowsheet Row Most Recent Value  Orbital Region Moderate depletion  Upper Arm Region Severe depletion  Thoracic and Lumbar Region Severe depletion  Buccal Region Severe depletion  Temple Region Severe depletion  Clavicle Bone Region Severe depletion  Clavicle and Acromion Bone Region Severe depletion  Scapular Bone Region Severe depletion  Dorsal Hand Moderate depletion  Patellar Region Severe depletion  Anterior Thigh Region Severe depletion  Posterior Calf  Region Severe depletion  Edema (RD Assessment) None  Hair Reviewed  Eyes Reviewed  Mouth Reviewed  Skin Reviewed  Nails Reviewed       Diet Order:   Diet Order             Diet clear liquid Room service appropriate? Yes; Fluid consistency: Thin  Diet effective now                   EDUCATION NEEDS:   No education needs have been identified at this time  Skin:  Skin Assessment: Reviewed RN Assessment  Last BM:  8/28  Height:   Ht Readings from Last 1 Encounters:  05/19/21 6\' 1"  (1.854 m)    Weight:  Wt Readings from Last 10 Encounters:  05/27/21 66.3 kg  10/25/20 70.2 kg  05/18/19 72.6 kg  04/15/18 72.6 kg  03/04/18 72.8 kg  01/17/18 68.4 kg  09/21/17 63.5 kg  07/06/17 69.9 kg  11/23/16 72.6 kg  11/08/16 66.2 kg   BMI:  There is no height or weight on file to calculate BMI.  Estimated Nutritional Needs:   Kcal:  3546-5681  Protein:  125-150 grams  Fluid:  1L+UOP    Larkin Ina, MS, RD, LDN (she/her/hers) RD pager number and weekend/on-call pager number located in [place].

## 2021-06-13 NOTE — Progress Notes (Signed)
Subjective: No acute overnight events. Patient was seen at bedside during rounds this morning. Pt reports improvement in abdominal pain post paracentesis. He has not had a BM today, but has had flatus. Pt wants to try eating eggs and grits. Pt denies SOB, CP, fever, N/V. No other complains or concerns at this time.    Objective:  Vital signs in last 24 hours: Vitals:   06/12/21 2321 06/13/21 0400 06/13/21 0808 06/13/21 1146  BP: (!) 157/102 (!) 151/95 (!) 152/95 (!) 167/104  Pulse: 98 97 96 91  Resp: 16 17 18 18   Temp: 98.5 F (36.9 C) 97.8 F (36.6 C) 97.6 F (36.4 C) 97.8 F (36.6 C)  TempSrc: Oral Oral Oral Axillary  SpO2: 95% 93% 95% 96%   Constitutional: alert, cachectic-appearing, appears to be in mild distress HENT: normocephalic, atraumatic, mucous membranes slightly dry Cardiovascular: regular rate and rhythm, no m/r/g Pulmonary/Chest: normal work of breathing on room air, lungs clear to auscultation bilaterally Abdominal: soft, mildly tender to palpation in upper quadrants, mildly distended  MSK: normal bulk and tone Neurological: alert & oriented x 3  Skin: warm and dry Psych: normal behavior, normal affect   Assessment/Plan:  Active Problems:   Ileus (HCC)   Protein-calorie malnutrition, severe  Timothy [name] is a 60 y.o. with pertinent PMHx of ESRD on HD T/Th/Sat, colon cancer s/p resection, HFpEF, HTN and polysubstance use  who presented with abdominal pain and constipation, and admitted for ileus.   Ileus Malnutrition  Pt had a couple BM yesterday, none today. He continues to have flatus. He tolerates clear liquid diet well, with no N/V. Albumin 2.1, nutrition consulted and started nutritional supplements. Improved abdominal pain post paracentesis, that is well controlled with the q4 dilaudid regimen (continues requiring it q4h). Remains afebrile, and leukocytosis improving 15.3 > 10.6. General surgery signing off, as no sign of acute surgical problems.   -advance to full liquid diet now for lunch, and possibly soft diet for dinner  -continue pain management with IV Dilaudid 0.5 mg q4h prn -continue to monitor for flatus and BM -will consider NG tube if develops N/V    Ascites Patient with large volume ascites throughout abdomen and pelvis with significant increase since prior CT abdomen on 05/20/2021. Elective diagnostic paracentesis with SAAG score < 1.1, concerning for malignancy, given renal mass concerning for RCC and hx of colon cancer. Body fluid cell count elevated 3600 but no neutrophilic predominance. Pt remains afebrile, no acute abdomen on exam and improving leukocytosis 15.3 > 10.6; bacterial peritonitis unlikely. -Add on cytology from diagnostic paracentesis  -Will consider starting abx if pt becomes febrile or develops leukocytosis    Hypertension SBPs 160s -Continue diltiazem -IV hydralazine q4h prn SBP>180 give 10 mg, or SBP>210 give 20 mg  ESRD HD T/Th/Sat. Pt completed a full session of HD today. 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  -HD per nephrology   Renal mass Splenic mass Hypodense right renal mass measuring 3.4 cm, seen on prior CT imaging and being followed by nephrology on outpatient basis. Patient with hypodense lesions within upper spleen that was 1.8 cm in diameter. Likely to represent a cyst. Also seen was on the inferior margin of the spleen a subtle hypodense foci not seen on previous exams and suspicious for splenic infarct versus neoplastic lesion. Possibly also secondary to edema and surrounding ascites. -follow-up as outpatient, as per nephrology note   Best Practice: Diet: full liquid diet  IVF: none  VTE: Enoxaparin 30  Code: Full  Signature: Lajean Manes, MD  Internal Medicine Resident, PGY-1 [facility]  Pager: [phone] After 5pm on weekdays and 1pm on weekends: On Call pager [phone]

## 2021-06-13 NOTE — Progress Notes (Signed)
Physical Therapy Evaluation Patient Details Name: Timothy [name] MRN: [id number] DOB: 1961-01-22 Today's Date: 06/13/2021   History of Present Illness  Pt is a 60yo male who presented to [facility] ED on 8/27 with complaints of constipation and abdominal pain. Imaging showed large volume ascites of the pelvis & abdomen as well as a R kidney mass. Of note, pt underwent exploratory laparotomy with lysis of adhesions on 8/8 following small bowel obstruction. 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   Clinical Impression  Pt presents with the impairments above and problems listed below. Pt perseverating on pain medication and fatigue. Required min assist for bed mobility, min guard for transfers and ambulation with RW. Following short-distance ambulation, pt reported "I'm done" and proceeded to return to bed. Per RN, pt has been ambulatory from the bed to the restroom as needed. Recommending HHPT upon discharge to address current limitations. We will continue to follow him acutely to promote independence with functional mobility.     Follow Up Recommendations Home health PT    Equipment Recommendations  Rolling walker with 5" wheels    Recommendations for Other Services       Precautions / Restrictions Precautions Precautions: None Restrictions Weight Bearing Restrictions: No      Mobility  Bed Mobility Overal bed mobility: Needs Assistance Bed Mobility: Supine to Sit;Sit to Supine     Supine to sit: Min assist Sit to supine: Min guard   General bed mobility comments: Pt required min assist for trunk assist during supine to sit. For sit to supine required min guard for safety only as pt impulsively "plopped" down after removing his pants.    Transfers Overall transfer level: Needs assistance Equipment used: Rolling walker (2 wheeled) Transfers: Sit to/from Stand Sit to Stand: Min guard         General transfer comment: Pt refused gait belt; pt  required min guard only for safety but did stand impulsively while pulling on RW with BUE despite cuing.  Ambulation/Gait Ambulation/Gait assistance: Min guard Gait Distance (Feet): 5 Feet Assistive device: Rolling walker (2 wheeled) Gait Pattern/deviations: Step-through pattern;Decreased step length - right;Decreased step length - left Gait velocity: decreased   General Gait Details: Pt walked from the bed to the sink and then stated "I'm done."  Demonstrated decreased step length and general fatigue. Unwilling to follow verbal cues or commands.  Stairs            Wheelchair Mobility    Modified Rankin (Stroke Patients Only)       Balance Overall balance assessment: Needs assistance Sitting-balance support: Feet supported;No upper extremity supported Sitting balance-Leahy Scale: Fair     Standing balance support: Bilateral upper extremity supported;During functional activity Standing balance-Leahy Scale: Poor Standing balance comment: Pt reliant on BUE on RW during static stance                             Pertinent Vitals/Pain Pain Assessment: Faces Faces Pain Scale: Hurts a little bit Pain Location: abdomen Pain Descriptors / Indicators: Discomfort;Grimacing;Dull;Moaning Pain Intervention(s): Patient requesting pain meds-RN notified;Repositioned;Monitored during session    Home Living Family/patient expects to be discharged to:: Private residence Living Arrangements: Spouse/significant other;Children Available Help at Discharge: Family;Available PRN/intermittently (Wife works, daughter back in school) Type of Home: Apartment Home Access: Stairs to enter Entrance Stairs-Rails: Left Entrance Stairs-Number of Steps: at least 2 flights Home Layout: One level [place]: None  Prior Function Level of Independence: Independent               Hand Dominance   Dominant Hand: Right    Extremity/Trunk Assessment   Upper Extremity  Assessment Upper Extremity Assessment: Generalized weakness    Lower Extremity Assessment Lower Extremity Assessment: Generalized weakness    Cervical / Trunk Assessment Cervical / Trunk Assessment: Kyphotic  Communication   Communication: No difficulties  Cognition Arousal/Alertness: Awake/alert Behavior During Therapy: Agitated;Impulsive Overall Cognitive Status: No family/caregiver present to determine baseline cognitive functioning                                 General Comments: Pt generally uncooperative and perseverating on pain meds which he had received within 60 minutes of sesion and claimed "they haven't fed me for five days" while tray of lunch was nearby with soup eaten.      General Comments      Exercises     Assessment/Plan    PT Assessment Patient needs continued PT services  PT Problem List Decreased strength;Decreased range of motion;Decreased activity tolerance;Decreased balance;Decreased mobility;Decreased knowledge of use of DME;Decreased safety awareness;Decreased knowledge of precautions;Pain       PT Treatment Interventions DME instruction;Gait training;Stair training;Therapeutic activities;Functional mobility training;Therapeutic exercise;Balance training;Patient/family education    PT Goals (Current goals can be found in the Care Plan section)  Acute Rehab PT Goals Patient Stated Goal: to be able to eat PT Goal Formulation: With patient Time For Goal Achievement: 06/27/21 Potential to Achieve Goals: Fair    Frequency Min 3X/week   Barriers to discharge        Co-evaluation               AM-PAC PT "6 Clicks" Mobility  Outcome Measure Help needed turning from your back to your side while in a flat bed without using bedrails?: A Little Help needed moving from lying on your back to sitting on the side of a flat bed without using bedrails?: A Little Help needed moving to and from a bed to a chair (including a  wheelchair)?: A Little Help needed standing up from a chair using your arms (e.g., wheelchair or bedside chair)?: A Little Help needed to walk in hospital room?: A Little Help needed climbing 3-5 steps with a railing? : A Little 6 Click Score: 18    End of Session   Activity Tolerance: Patient limited by pain Patient left: in bed;with call bell/phone within reach Nurse Communication: Mobility status;Patient requests pain meds;Other (comment) (Pt requests more food.) PT Visit Diagnosis: Muscle weakness (generalized) (M62.81);Unsteadiness on feet (R26.81)    Time: 7782-4235 PT Time Calculation (min) (ACUTE ONLY): 10 min   Charges:   PT Evaluation $PT Eval Low Complexity: 1 Low          Dawayne Cirri, SPT Dawayne Cirri 06/13/2021, 5:08 PM

## 2021-06-14 DIAGNOSIS — K567 Ileus, unspecified: Secondary | ICD-10-CM | POA: Diagnosis not present

## 2021-06-14 DIAGNOSIS — E43 Unspecified severe protein-calorie malnutrition: Secondary | ICD-10-CM | POA: Diagnosis not present

## 2021-06-14 LAB — CBC WITH DIFFERENTIAL/PLATELET
Abs Immature Granulocytes: 0.08 10*3/uL — ABNORMAL HIGH (ref 0.00–0.07)
Basophils Absolute: 0 10*3/uL (ref 0.0–0.1)
Basophils Relative: 0 %
Eosinophils Absolute: 0 10*3/uL (ref 0.0–0.5)
Eosinophils Relative: 0 %
HCT: 29.4 % — ABNORMAL LOW (ref 39.0–52.0)
Hemoglobin: 10.2 g/dL — ABNORMAL LOW (ref 13.0–17.0)
Immature Granulocytes: 1 %
Lymphocytes Relative: 4 %
Lymphs Abs: 0.4 10*3/uL — ABNORMAL LOW (ref 0.7–4.0)
MCH: 29.7 pg (ref 26.0–34.0)
MCHC: 34.7 g/dL (ref 30.0–36.0)
MCV: 85.7 fL (ref 80.0–100.0)
Monocytes Absolute: 0.9 10*3/uL (ref 0.1–1.0)
Monocytes Relative: 8 %
Neutro Abs: 9.2 10*3/uL — ABNORMAL HIGH (ref 1.7–7.7)
Neutrophils Relative %: 87 %
Platelets: 384 10*3/uL (ref 150–400)
RBC: 3.43 MIL/uL — ABNORMAL LOW (ref 4.22–5.81)
RDW: 13.4 % (ref 11.5–15.5)
WBC: 10.6 10*3/uL — ABNORMAL HIGH (ref 4.0–10.5)
nRBC: 0 % (ref 0.0–0.2)

## 2021-06-14 LAB — BASIC METABOLIC PANEL
Anion gap: 16 — ABNORMAL HIGH (ref 5–15)
BUN: 73 mg/dL — ABNORMAL HIGH (ref 6–20)
CO2: 26 mmol/L (ref 22–32)
Calcium: 8.3 mg/dL — ABNORMAL LOW (ref 8.9–10.3)
Chloride: 90 mmol/L — ABNORMAL LOW (ref 98–111)
Creatinine, Ser: 7.15 mg/dL — ABNORMAL HIGH (ref 0.61–1.24)
GFR, Estimated: 8 mL/min — ABNORMAL LOW (ref >60)
Glucose, Bld: 94 mg/dL (ref 70–99)
Potassium: 5.5 mmol/L — ABNORMAL HIGH (ref 3.5–5.1)
Sodium: 132 mmol/L — ABNORMAL LOW (ref 135–145)

## 2021-06-14 MED ORDER — FERRIC CITRATE 1 GM 210 MG(FE) PO TABS
420.0000 mg | ORAL_TABLET | Freq: Three times a day (TID) | ORAL | Status: DC
  Administered 2021-06-14 – 2021-06-25 (×27): 420 mg via ORAL
  Filled 2021-06-14 (×36): qty 2

## 2021-06-14 MED ORDER — ACETAMINOPHEN 325 MG PO TABS
650.0000 mg | ORAL_TABLET | Freq: Once | ORAL | Status: AC
  Administered 2021-06-14: 650 mg via ORAL
  Filled 2021-06-14: qty 2

## 2021-06-14 MED ORDER — DIPHENHYDRAMINE HCL 25 MG PO CAPS
25.0000 mg | ORAL_CAPSULE | Freq: Once | ORAL | Status: AC
  Administered 2021-06-14: 25 mg via ORAL
  Filled 2021-06-14: qty 1

## 2021-06-14 MED ORDER — NEPRO/CARBSTEADY PO LIQD
237.0000 mL | Freq: Three times a day (TID) | ORAL | Status: DC
  Administered 2021-06-14 – 2021-06-20 (×15): 237 mL via ORAL
  Filled 2021-06-14 (×3): qty 237

## 2021-06-14 MED ORDER — DARBEPOETIN ALFA 40 MCG/0.4ML IJ SOSY
40.0000 ug | PREFILLED_SYRINGE | INTRAMUSCULAR | Status: DC
  Administered 2021-06-16 – 2021-06-23 (×2): 40 ug via INTRAVENOUS
  Filled 2021-06-14 (×3): qty 0.4

## 2021-06-14 NOTE — Progress Notes (Addendum)
Subjective: No acute overnight events. Patient was seen at bedside during rounds this morning. Pt reports improvement in abdominal pain. He has had a BM today, but continues to have flatus. Pt requests to advance diet. Pt denies SOB, CP, fever, N/V. No other complains or concerns at this time.    Objective:  Vital signs in last 24 hours: Vitals:   06/14/21 0900 06/14/21 0930 06/14/21 1000 06/14/21 1030  BP: (!) 141/96 (!) 161/92 (!) 159/82 (!) 149/92  Pulse: 82 82 79 78  Resp:      Temp:      TempSrc:      SpO2:      Weight:       Constitutional: alert, cachectic-appearing, appears to be in mild distress HENT: normocephalic, atraumatic, mucous membranes slightly dry Cardiovascular: regular rate and rhythm, no m/r/g Pulmonary/Chest: normal work of breathing on room air, lungs clear to auscultation bilaterally Abdominal: soft, mildly tender to palpation in upper quadrants, mildly distended  MSK: normal bulk and tone Neurological: alert & oriented x 3  Skin: warm and dry Psych: normal behavior, normal affect   Assessment/Plan:  Active Problems:   Ileus (HCC)   Protein-calorie malnutrition, severe  Mohammad Granade is a 60 y.o. with pertinent PMHx of ESRD on HD T/Th/Sat, colon cancer s/p resection, HFpEF, HTN and polysubstance use  who presented with abdominal pain and constipation, and admitted for ileus.   Ileus Malnutrition  Continues to make progress. Pt had a BM today and continues to have flatus. He tolerates clear liquid diet well, with no N/V and plan to advance diet. Albumin 2.1, nutrition consulted and started nutritional supplements. Pt complains of ongoing abdominal pain, will adjust regimen. Remains afebrile, but has mild leukocytosis 12.5. General surgery signing off, as no sign of acute surgical problems.  -advance to full renal diet   -change from IV Dilaudid 0.5 mg q4h to percocet 5-325  -continue to monitor for flatus and BM -will consider NG tube if  develops N/V    Ascites Patient with large volume ascites throughout abdomen and pelvis with significant increase since prior CT abdomen on 05/20/2021. Elective diagnostic paracentesis with SAAG score < 1.1, concerning for malignancy, given renal mass concerning for RCC and hx of colon cancer. Given that pt complains of non-improving abdominal pain and abdominal distention, will consult GI given concern for malignancy, and downtrending Hbg 12.1 two days ago -> 9.4 today with complaints of hematochezia on presentation. Will also get complete abdominal US to better assess.    Body fluid cell count elevated 3600. Pt remains afebrile, no acute abdomen on exam with initial improving leukocytosis 15.3 > 10.6 but mild spike today 12.5. Peritoneal culture results today showing gram negative rods, concerning for bacterial peritonitis.  -Cytology pending   -f/u on complete abdomen US  -Consult GI, appreciate recommendations  -Will consider starting abx if pt becomes febrile or develops leukocytosis    Hypertension SBPs 150-160s -Continue diltiazem -IV hydralazine q4h prn SBP>180 give 10 mg, or SBP>210 give 20 mg  ESRD HD T/Th/Sat. Next HD 9/1. 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  -HD per nephrology   Renal mass Splenic mass Hypodense right renal mass measuring 3.4 cm, seen on prior CT imaging and being followed by nephrology on outpatient basis. Patient with hypodense lesions within upper spleen that was 1.8 cm in diameter. Likely to represent a cyst. Also seen was on the inferior margin of the spleen a subtle hypodense foci not seen on previous  exams and suspicious for splenic infarct versus neoplastic lesion. Possibly also secondary to edema and surrounding ascites. -Nephrology recommending outpatient urology consult with imaging outpatient.     Best Practice: Diet: renal diet  IVF: none  VTE: Enoxaparin 30  Code: Full  Signature: Lajean Manes, MD  Internal Medicine Resident,  PGY-1 [facility]  Pager: [phone] After 5pm on weekdays and 1pm on weekends: On Call pager [phone]

## 2021-06-14 NOTE — Progress Notes (Signed)
Subjective: Seen on dialysis tolerating UF, reports abdominal pain improved after paracentesis yesterday  Objective Vital signs in last 24 hours: Vitals:   06/14/21 0808 06/14/21 0810 06/14/21 0830 06/14/21 0900  BP: (!) 157/91 (!) 157/93 (!) 148/90 (!) 141/96  Pulse: 81 81 80 82  Resp:      Temp:      TempSrc:      SpO2:      Weight:       Weight change:   Physical Exam: General: Thin alert male chronically ill NAD Heart: RRR, no MRG Lungs: CTA nonlabored breathing Abdomen: Slightly hyperactive bowel sounds, less distended, improved generalized tenderness Extremities: Trace pedal edema Dialysis Access: Patent on HD LUA aVF    OP Dialysis Orders: [facility] 3 hr 45 min 2K, 2 CA bath  UF P4  EDW 65(but last 2 treatments 64.3 post weights) No heparin Venofer 100x3 Mircera every 4 weeks 50 MCG not given recently as OP   Problem/Plan: Ileus= work-up with [facility] and admit team ESRD -HD TTS Ascites =status post 1850 mL amber fluid yesterday therapeutic and diagnostic per admit team, ascites ?etiology =ESRD(chronic sign off early), hypoalbuminemia/right heart failure Hypertension/volume  -mild hypertension BP improved on HD with UF and with abdominal pain improved and also ascites currently tolerating 1 L UF, will need lower dry weight at discharge last weight at 63 kg.  Current meds = diltiazem CD 360 daily, IV hydralazine as needed Anemia of ESRD-Hgb 10.2, restart next dialysis low-dose Aranesp / continue Venofer load x3 Metabolic bone disease -no vitamin D, labs pending, yesterday noted phosphorus 9.1 corrected calcium over 10 no vitamin D, outpatient phosphate binder Renvela-not a good choice with his GI issues will change to [place] when taking meals.. History of renal mass/splenic mass= renal mass of the right 3.4 cm seen on prior CT imaging follow-up as outpatient, splenic mass 1.8 cm  Nutrition -currently n.p.o. with GI issue  Ernest Haber, PA-C [facility] Beeper [phone] 06/14/2021,9:16 AM  LOS: 2 days   Labs: Basic Metabolic Panel: Recent Labs  Lab 06/12/21 0918 06/13/21 1002 06/14/21 0134  NA 137 134* 132*  K 4.4 5.7* 5.5*  CL 94* 89* 90*  CO2 31 30 26   GLUCOSE 68* 83 94  BUN 30* 54* 73*  CREATININE 4.92* 6.43* 7.15*  CALCIUM 8.8* 9.0 8.3*  PHOS 6.3* 9.1*  --    Liver Function Tests: Recent Labs  Lab 06/11/21 1427 06/12/21 0918 06/13/21 1002  AST 18  --   --   ALT 9  --   --   ALKPHOS 59  --   --   BILITOT 0.6  --   --   PROT 6.4*  --   --   ALBUMIN 2.4* 2.1* 2.1*   Recent Labs  Lab 06/11/21 1427  LIPASE 50   No results for input(s): AMMONIA in the last 168 hours. CBC: Recent Labs  Lab 06/11/21 1427 06/12/21 0918 06/13/21 1002 06/14/21 0134  WBC 12.3* 15.2* 14.3* 10.6*  NEUTROABS 10.1* 13.1* 12.3* 9.2*  HGB 10.5* 11.6* 12.1* 10.2*  HCT 31.4* 34.3* 34.7* 29.4*  MCV 89.2 87.7 85.9 85.7  PLT 342 369 463* 384   Cardiac Enzymes: No results for input(s): CKTOTAL, CKMB, CKMBINDEX, TROPONINI in the last 168 hours. CBG: No results for input(s): GLUCAP in the last 168 hours.  Studies/Results: No results found. Medications:  ferric gluconate (FERRLECIT) IVPB 125 mg (06/14/21 0900)    (feeding supplement) PROSource Plus  30 mL Oral  BID BM   bisacodyl  10 mg Rectal Daily   diltiazem  360 mg Oral Daily   enoxaparin (LOVENOX) injection  30 mg Subcutaneous Q24H   feeding supplement  1 Container Oral TID BM   ferric citrate  420 mg Oral TID WC   multivitamin  1 tablet Oral QHS

## 2021-06-14 NOTE — Progress Notes (Addendum)
Subjective: CC: Doing well. Seen in HD. Had para yesterday. Feels less distended. No abdominal pain, n/v. Passing flatus. BM yesterday.   Objective: Vital signs in last 24 hours: Temp:  [97.5 F (36.4 C)-98.7 F (37.1 C)] 97.9 F (36.6 C) (08/30 0800) Pulse Rate:  [78-91] 78 (08/30 1030) Resp:  [16-18] 17 (08/30 0800) BP: (137-167)/(77-104) 149/92 (08/30 1030) SpO2:  [92 %-96 %] 92 % (08/30 0800) Weight:  [63.4 kg] 63.4 kg (08/30 0800) Last BM Date: 06/13/21 (small BM)  Intake/Output from previous day: 08/29 0701 - 08/30 0700 In: 480 [P.O.:480] Out: -  Intake/Output this shift: No intake/output data recorded.  PE: Gen:  Alert, NAD, pleasant Abd: Soft, ND, NT +BS Psych: A&Ox3  Skin: no rashes noted, warm and dry  Lab Results:  Recent Labs    06/13/21 1002 06/14/21 0134  WBC 14.3* 10.6*  HGB 12.1* 10.2*  HCT 34.7* 29.4*  PLT 463* 384   BMET Recent Labs    06/13/21 1002 06/14/21 0134  NA 134* 132*  K 5.7* 5.5*  CL 89* 90*  CO2 30 26  GLUCOSE 83 94  BUN 54* 73*  CREATININE 6.43* 7.15*  CALCIUM 9.0 8.3*   PT/INR No results for input(s): LABPROT, INR in the last 72 hours. CMP     Component Value Date/Time   NA 132 (L) 06/14/2021 0134   K 5.5 (H) 06/14/2021 0134   CL 90 (L) 06/14/2021 0134   CO2 26 06/14/2021 0134   GLUCOSE 94 06/14/2021 0134   BUN 73 (H) 06/14/2021 0134   CREATININE 7.15 (H) 06/14/2021 0134   CREATININE 1.59 (H) 07/29/2015 0943   CALCIUM 8.3 (L) 06/14/2021 0134   CALCIUM 8.4 (L) 09/14/2016 1615   PROT 6.4 (L) 06/11/2021 1427   ALBUMIN 2.1 (L) 06/13/2021 1002   AST 18 06/11/2021 1427   ALT 9 06/11/2021 1427   ALKPHOS 59 06/11/2021 1427   BILITOT 0.6 06/11/2021 1427   GFRNONAA 8 (L) 06/14/2021 0134   GFRNONAA 48 (L) 07/29/2015 0943   GFRAA 7 (L) 05/18/2019 2220   GFRAA 56 (L) 07/29/2015 0943   Lipase     Component Value Date/Time   LIPASE 50 06/11/2021 1427    Studies/Results: No results  found.  Anti-infectives: Anti-infectives (From admission, onward)    None        Assessment/Plan S/p exploratory laparotomy/ adhesiolysis 05/23/21 for small bowel obstruction - Dr. Donne Hazel S/p previous right hemicolectomy for colon cancer Large volume ascites - s/p paracentesis 8/29. 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 Advance diet as tolerated.  Imogene Burn. Georgette Dover, MD, [facility] Surgery  General Surgery   06/14/2021 12:07 PM

## 2021-06-14 NOTE — Progress Notes (Signed)
   06/14/21 1115  Vitals  Temp 97.8 F (36.6 C)  Temp Source Oral  BP (!) 162/61  BP Location Right Wrist  BP Method Automatic  Patient Position (if appropriate) Lying  Pulse Rate 76  Pulse Rate Source Monitor  ECG Heart Rate 77  Resp 18  Oxygen Therapy  SpO2 93 %  O2 Device Room Air  Dialysis Weight  Weight 62.1 kg (unable to stand)  Type of Weight Post-Dialysis  Post-Hemodialysis Assessment  Rinseback Volume (mL) 250 mL  KECN 279 V  Dialyzer Clearance Lightly streaked  Duration of HD Treatment -hour(s) 3 hour(s)  Hemodialysis Intake (mL) 600 mL  UF Total -Machine (mL) 1600 mL  Net UF (mL) 1000 mL  Tolerated HD Treatment Yes  Post-Hemodialysis Comments tx complete-pt stable  Fistula / Graft Left Forearm Arteriovenous fistula  Placement Date/Time: 09/19/16 1228   Placed prior to admission: No  Orientation: Left  Access Location: Forearm  Access Type: Arteriovenous fistula  Site Condition No complications  Fistula / Graft Assessment Present;Thrill;Bruit  Status Deaccessed  HD tx complete-pt stable

## 2021-06-14 NOTE — Progress Notes (Addendum)
OT Cancellation Note  Patient Details Name: Timothy [name] MRN: [id number] DOB: 11-22-1960   Cancelled Treatment:    Reason Eval/Treat Not Completed: Patient at procedure or test/ unavailable (HD. Will return as schedule allows.) Will return as schedule allows.     Second attempt @ 1417: Pt declining out of bed activity stating "not today". Will return as schedule allows.   [place], OTR/L Acute Rehab Pager: [phone] Office: [phone] 06/14/2021, 8:49 AM

## 2021-06-14 NOTE — TOC Initial Note (Addendum)
Transition of Care [facility]) - Initial/Assessment Note    Patient Details  Name: Timothy [name] MRN: [id number] Date of Birth: 06-17-1961  Transition of [facility]) CM/SW Contact:    Marilu Favre, RN Phone Number: 06/14/2021, 3:22 PM  Clinical Narrative:                 Patient from home with wife. Confirmed face sheet information. Discussed PT recommendations for HHPT and walker . Patient in agreement and will need walker. Walker ordered with Freda Munro with [facility] with Alvis Lemmings accepted referral for HHPT, will need orders and face to face   Expected Discharge Plan: [place]     Patient Goals and CMS Choice   CMS Medicare.gov Compare Post Acute Care list provided to:: Patient Choice offered to / list presented to : Patient  Expected Discharge Plan and Services Expected Discharge Plan: [name]   Discharge Planning Services: CM Consult Post Acute Care Choice: Home Health, Durable Medical Equipment Living arrangements for the past 2 months: Apartment                 DME Arranged: Walker rolling DME Agency: [employer] Date DME Agency Contacted: 06/14/21 Time DME Agency Contacted: 63   [place] Arranged: PT          Prior Living Arrangements/Services Living arrangements for the past 2 months: Apartment Lives with:: Spouse Patient language and need for interpreter reviewed:: Yes Do you feel safe going back to the place where you live?: Yes      Need for Family Participation in Patient Care: Yes (Comment) Care giver support system in place?: Yes (comment)   Criminal Activity/Legal Involvement Pertinent to Current Situation/Hospitalization: No - Comment as needed  Activities of Daily Living Home Assistive Devices/Equipment: None ADL Screening (condition at time of admission) Patient's cognitive ability adequate to safely complete daily activities?: Yes Is the patient deaf or have difficulty hearing?: No Does the patient  have difficulty seeing, even when wearing glasses/contacts?: Yes Does the patient have difficulty concentrating, remembering, or making decisions?: No Patient able to express need for assistance with ADLs?: Yes Does the patient have difficulty dressing or bathing?: No Independently performs ADLs?: Yes (appropriate for developmental age) Does the patient have difficulty walking or climbing stairs?: Yes Weakness of Legs: Both Weakness of Arms/Hands: None  Permission Sought/Granted   Permission granted to share information with : No              Emotional Assessment Appearance:: Appears stated age Attitude/Demeanor/Rapport: Engaged Affect (typically observed): Accepting Orientation: : Oriented to Self, Oriented to Place, Oriented to  Time, Oriented to Situation Alcohol / Substance Use: Not Applicable Psych Involvement: No (comment)  Admission diagnosis:  Ileus ([place]) [K56.7] Patient Active Problem List   Diagnosis Date Noted   Protein-calorie malnutrition, severe 06/13/2021   Ileus ([place]) 06/11/2021   Small bowel obstruction ([place]) 05/20/2021   DOE (dyspnea on exertion) 10/25/2020   Orthopnea 10/25/2020   Open wound of left hand 01/18/2018   Tibial fracture 01/14/2018   Tibial plateau fracture, left 01/13/2018   Malnutrition of moderate degree 09/20/2016   AKI (acute kidney injury) ([place])    History of colon cancer    Hyponatremia    Leukocytosis    Acute blood loss anemia    Acute metabolic encephalopathy 89/37/3428   ESRD on dialysis ([place]) 09/09/2016   Hypertensive emergency 09/09/2016   Acute respiratory failure with hypoxia ([place]) 09/09/2016   Acute  pulmonary edema ([place]) 09/09/2016   Polysubstance abuse ([place]) 09/09/2016   Nonadherence to medical treatment 09/09/2016   Anemia due to chronic kidney disease 09/09/2016   Altered mental status 09/09/2016   Acute respiratory failure ([place])    Drug ingestion    Hypertensive heart and chronic kidney disease with heart failure  and with stage 5 chronic kidney disease, or end stage renal disease ([place]) 07/19/2016   Chest pain    Elevated troponin 74/16/3845   Acute diastolic heart failure, NYHA class 2 ([place]) 07/17/2016   Heroin abuse ([place]) 07/17/2016   HTN (hypertension) 07/17/2016   Benign prostate hyperplasia 07/16/2015   PCP:  Timothy Mccreedy, MD Pharmacy:   [facility], [place] [address] Phone: [phone] Fax: [place], [place] [address] [place] [address] Phone: [phone] Fax: [phone]     Social Determinants of Health (SDOH) Interventions    Readmission Risk Interventions No flowsheet data found.

## 2021-06-15 ENCOUNTER — Inpatient Hospital Stay (HOSPITAL_COMMUNITY): Payer: Medicare HMO

## 2021-06-15 DIAGNOSIS — E43 Unspecified severe protein-calorie malnutrition: Secondary | ICD-10-CM | POA: Diagnosis not present

## 2021-06-15 DIAGNOSIS — K567 Ileus, unspecified: Secondary | ICD-10-CM | POA: Diagnosis not present

## 2021-06-15 LAB — BASIC METABOLIC PANEL
Anion gap: 11 (ref 5–15)
BUN: 47 mg/dL — ABNORMAL HIGH (ref 6–20)
CO2: 28 mmol/L (ref 22–32)
Calcium: 8 mg/dL — ABNORMAL LOW (ref 8.9–10.3)
Chloride: 93 mmol/L — ABNORMAL LOW (ref 98–111)
Creatinine, Ser: 5.23 mg/dL — ABNORMAL HIGH (ref 0.61–1.24)
GFR, Estimated: 12 mL/min — ABNORMAL LOW (ref >60)
Glucose, Bld: 96 mg/dL (ref 70–99)
Potassium: 4.4 mmol/L (ref 3.5–5.1)
Sodium: 132 mmol/L — ABNORMAL LOW (ref 135–145)

## 2021-06-15 LAB — CBC
HCT: 26.7 % — ABNORMAL LOW (ref 39.0–52.0)
Hemoglobin: 9.4 g/dL — ABNORMAL LOW (ref 13.0–17.0)
MCH: 29.8 pg (ref 26.0–34.0)
MCHC: 35.2 g/dL (ref 30.0–36.0)
MCV: 84.8 fL (ref 80.0–100.0)
Platelets: 313 10*3/uL (ref 150–400)
RBC: 3.15 MIL/uL — ABNORMAL LOW (ref 4.22–5.81)
RDW: 13.9 % (ref 11.5–15.5)
WBC: 12.5 10*3/uL — ABNORMAL HIGH (ref 4.0–10.5)
nRBC: 0 % (ref 0.0–0.2)

## 2021-06-15 LAB — CYTOLOGY - NON PAP

## 2021-06-15 MED ORDER — DEXTROSE 5 % IV SOLN
2.0000 g | Freq: Once | INTRAVENOUS | Status: AC
  Administered 2021-06-15: 2 g via INTRAVENOUS
  Filled 2021-06-15 (×2): qty 2

## 2021-06-15 MED ORDER — DEXTROSE 5 % IV SOLN
2.0000 g | INTRAVENOUS | Status: DC
  Filled 2021-06-15: qty 2

## 2021-06-15 MED ORDER — OXYCODONE-ACETAMINOPHEN 5-325 MG PO TABS
1.0000 | ORAL_TABLET | Freq: Four times a day (QID) | ORAL | Status: DC | PRN
Start: 2021-06-15 — End: 2021-06-20
  Administered 2021-06-15 – 2021-06-20 (×19): 1 via ORAL
  Filled 2021-06-15 (×19): qty 1

## 2021-06-15 MED ORDER — CHLORHEXIDINE GLUCONATE CLOTH 2 % EX PADS
6.0000 | MEDICATED_PAD | Freq: Every day | CUTANEOUS | Status: DC
  Administered 2021-06-15 – 2021-06-25 (×9): 6 via TOPICAL

## 2021-06-15 NOTE — Progress Notes (Signed)
Pharmacy Antibiotic Note  Timothy [name] is a 60 y.o. male admitted on 06/11/2021 with melena and constipation.  Found to have large volume ascites, pancreatic duct dilatation and possible splenic infarcts vs neoplasm.  Now s/p ex-lap with LOA on 8/8.  Pharmacy has been consulted for cefotaxime dosing for SBP.  Abdominal fluid culture growing GNR.  Patient has ESRD on TTS HD, tolerated 8/30 session.  Afebrile, WBC up to 12.5.    Plan: Cefotaxime 2gm IV Q24H with ESRD Pharmacy will sign off since no further dosage adjustment needed.  Thank you for the consult!  Height: 6\' 1"  (185.4 cm) Weight: 62.1 kg (136 lb 14.5 oz) (unable to stand) IBW/kg (Calculated) : 79.9  Temp (24hrs), Avg:98.3 F (36.8 C), Min:97.7 F (36.5 C), Max:99 F (37.2 C)  Recent Labs  Lab 06/11/21 1427 06/12/21 0918 06/13/21 1002 06/14/21 0134 06/15/21 0035  WBC 12.3* 15.2* 14.3* 10.6* 12.5*  CREATININE 3.93* 4.92* 6.43* 7.15* 5.23*  LATICACIDVEN 1.4  --   --   --   --     Estimated Creatinine Clearance: 13.2 mL/min (A) (by C-G formula based on SCr of 5.23 mg/dL (H)).    Allergies  Allergen Reactions   No Known Allergies     Cefotaxime 8/31 >>  8/29 abd fluid - GNR  [name], PharmD, BCPS, [place] 06/15/2021, 1:43 PM

## 2021-06-15 NOTE — Progress Notes (Signed)
[facility] Progress Note   Subjective: Seen in room. Has food, not eating. C/O abdominal pain. HD tomorrow on schedule  Objective Vitals:   06/15/21 0343 06/15/21 0627 06/15/21 0948 06/15/21 0952  BP: (!) 143/79  (!) 152/72 (!) 150/72  Pulse: 92   91  Resp: 17   17  Temp: 97.9 F (36.6 C)   97.7 F (36.5 C)  TempSrc: Oral   Oral  SpO2: 92%   92%  Weight:      Height:  6\' 1"  (1.854 m)     Physical Exam General: Thin, Chronically ill appearing male in NAD Heart: S1,S2, RRR no M/R/G Lungs: CTAB A/P Abdomen: appears distended, firm. Active BS, mild tenderness to palpation.  Extremities: No LE edema Dialysis Access: L AVF + T/B pulsatile with thinning area lower portion. Monitor.     Additional Objective Labs: Basic Metabolic Panel: Recent Labs  Lab 06/12/21 0918 06/13/21 1002 06/14/21 0134 06/15/21 0035  NA 137 134* 132* 132*  K 4.4 5.7* 5.5* 4.4  CL 94* 89* 90* 93*  CO2 31 30 26 28   GLUCOSE 68* 83 94 96  BUN 30* 54* 73* 47*  CREATININE 4.92* 6.43* 7.15* 5.23*  CALCIUM 8.8* 9.0 8.3* 8.0*  PHOS 6.3* 9.1*  --   --    Liver Function Tests: Recent Labs  Lab 06/11/21 1427 06/12/21 0918 06/13/21 1002  AST 18  --   --   ALT 9  --   --   ALKPHOS 59  --   --   BILITOT 0.6  --   --   PROT 6.4*  --   --   ALBUMIN 2.4* 2.1* 2.1*   Recent Labs  Lab 06/11/21 1427  LIPASE 50   CBC: Recent Labs  Lab 06/11/21 1427 06/12/21 0918 06/13/21 1002 06/14/21 0134 06/15/21 0035  WBC 12.3* 15.2* 14.3* 10.6* 12.5*  NEUTROABS 10.1* 13.1* 12.3* 9.2*  --   HGB 10.5* 11.6* 12.1* 10.2* 9.4*  HCT 31.4* 34.3* 34.7* 29.4* 26.7*  MCV 89.2 87.7 85.9 85.7 84.8  PLT 342 369 463* 384 313   Blood Culture    Component Value Date/Time   SDES FLUID ABDOMEN 06/13/2021 1504   SDES FLUID ABDOMEN 06/13/2021 1504   SPECREQUEST BOTTLES DRAWN AEROBIC AND ANAEROBIC 06/13/2021 1504   SPECREQUEST NONE 06/13/2021 1504   CULT GRAM NEGATIVE RODS 06/13/2021 1504    REPTSTATUS PENDING 06/13/2021 1504   REPTSTATUS 06/13/2021 FINAL 06/13/2021 1504    Cardiac Enzymes: No results for input(s): CKTOTAL, CKMB, CKMBINDEX, TROPONINI in the last 168 hours. CBG: No results for input(s): GLUCAP in the last 168 hours. Iron Studies: No results for input(s): IRON, TIBC, TRANSFERRIN, FERRITIN in the last 72 hours. @lablastinr3 @ Studies/Results: No results found. Medications:  ferric gluconate (FERRLECIT) IVPB Stopped (06/14/21 1054)    (feeding supplement) PROSource Plus  30 mL Oral BID BM   bisacodyl  10 mg Rectal Daily   [START ON 06/16/2021] darbepoetin (ARANESP) injection - DIALYSIS  40 mcg Intravenous Q Thu-HD   diltiazem  360 mg Oral Daily   enoxaparin (LOVENOX) injection  30 mg Subcutaneous Q24H   feeding supplement (NEPRO CARB STEADY)  237 mL Oral TID BM   ferric citrate  420 mg Oral TID WC   multivitamin  1 tablet Oral QHS   HD orders: [place] 3 h 45 min 180NRe 450/Autoflow 1.5 2.0K/2.0 Ca 65 kgs  UFP 4 AVF -No heparin -Mircera 50 mcg IV q 4 weeks (Last dose 05/12/2021)  Assessment/Plan: 1. Ileus-per primary. CCS following 2. ESRD -T,Th,S Next HD 06/16/2021 3. Anemia - HGB 9.4. ESA due tomorrow. Ordered.  4. Secondary hyperparathyroidism - Continued binders 5. HTN/volume - BP controlled. HD 08/30 Net UF 1 Liter. Na 132 attempt to lower volume a bit as tolerated.  6. Nutrition - resumed diet today. Albumin 2.1. Order protein supplements.  7. H/O renal mass-following as OP  Shelonda Saxe H. Masiyah Jorstad NP-C 06/15/2021, 10:44 AM  [employer] [phone]

## 2021-06-15 NOTE — Consult Note (Addendum)
[facility] Consult: 11:51 AM 06/15/2021  LOS: 3 days    Referring Provider: Dr Leotis Shames  Primary Care Physician:  [name], MD Primary Gastroenterologist:  unassigned.      Reason for Consultation:  abdominal pain.     HPI: Timothy [name] is a 60 y.o. male.  PMH ESRD on HD.  Anemia of renal disease, on Aranesp, [place].  History heroin, cocaine, and marijuana abuse.  BPH.  Hypertension.  Diastolic CHF.  Hep C Ab infection treated with unknown antiviral within the past year by MD at [facility] clinic.  Subsequent viral load testing favorable but we do not have the results..  Medical non-compliance.   Colon cancer diagnosed 2014, underwent right hemicolectomy.  All of this occurred while living in [place].  No surgical, colonoscopic, pathology records in Epic.  Has never had a follow-up colonoscopy and has never had an endoscopy.  8/4 -05/27/2021 admission with SBO. 05/23/2021 exploratory laparotomy.  Anastomosis adherent to abdominal wall, very dilated small bowel.  Multiple LOA's and possible internal hernia with non-obstructing small bowel herniating through mesenteric defect from right colon.  Obstruction emanating from terminal ileum next to the surgical anastomosis, this was released.  At discharge tolerating diet, passing stool.  Presented back to the ED 5 d ago.  C/O RLQ abdominal pain, constipation, urinary retention, bloody stool.  Says pain never got better after the surgery.  Located in the upper mid abdomen between xiphoid and bellybutton.  It is worse after he eats or drinks which also causes bloating.  Denies nausea and vomiting.  Had not had a bowel movement for 3 or 4 days.  Since admission to hospital has only had small stools and passing some flatus.  06/11/2021 CTAP w contrast: Large amount  of ascites throughout abdomen and pelvis increased compared with CT of 8/5.  S/p right hemicolectomy.  Patulous solitary loop of large bowel in upper abdomen at site of anastomosis but no proximal or distal bowel dilatation to suggest obstruction.  Fluid and air-fluid levels in nondistended small bowel suggestive of ileus.  3.4 cm mass in the right kidney, suspicious for neoplasm.  Multiple cysts in both kidneys which are atrophic.  Subtle foci in the spleen suspicious for infarcts or neoplastic/metastatic lesions.  Stable, presumed cyst within upper spleen.  Dense, bibasilar lung consolidations, pneumonia and/or aspiration.  Cholelithiasis.  Stable pancreatic duct dilatation of uncertain etiology.  06/13/2021 1.8 L paracentesis.  SAAG <1.  Nucleated cells 3,600, 80% neutrophils.  cytology pending.  Has not started antibiotics.      Past Medical History:  Diagnosis Date   Anemia of chronic kidney failure    BPH (benign prostatic hyperplasia)    Colon cancer ([employer]) 2014   End stage renal disease on dialysis ([place]) 2017   Hypertension    Hypertensive heart disease with chronic diastolic congestive heart failure ([place]) 07/17/2016   Polysubstance abuse ([place])    History of heroin and marijuana use    Past Surgical History:  Procedure Laterality Date   ABDOMINAL SURGERY     AV  FISTULA PLACEMENT Left 09/19/2016   Procedure: Left arm Radiocephalic ARTERIOVENOUS (AV) FISTULA CREATION;  Surgeon: Conrad McComb, MD;  Location: [facility];  Service: Vascular;  Laterality: Left;   BASCILIC VEIN TRANSPOSITION Left 07/09/2017   Procedure: BRACHIOCEPHALIC FISTULA CREATION;  Surgeon: Conrad [place], MD;  Location: [facility] OR;  Service: Vascular;  Laterality: Left;   COLON SURGERY  2014   INSERTION OF DIALYSIS CATHETER N/A 09/19/2016   Procedure: INSERTION OF TUNNELED DIALYSIS CATHETER;  Surgeon: Conrad [place], MD;  Location: [facility];  Service: Vascular;  Laterality: N/A;   IR GENERIC HISTORICAL  09/14/2016   IR US GUIDE VASC  ACCESS RIGHT 09/14/2016 [name], DO [facility]-INTERV RAD   IR GENERIC HISTORICAL  09/14/2016   IR FLUORO GUIDE CV LINE RIGHT 09/14/2016 [name], DO [facility]-INTER[facility] RAD   LAPAROTOMY N/A 05/23/2021   Procedure: EXPLORATORY LAPAROTOMY LYSIS ADHESIONS;  Surgeon: [name], MD;  Location: [facility];  Service: General;  Laterality: N/A;   ORIF TIBIA PLATEAU Left 01/15/2018   Procedure: OPEN REDUCTION INTERNAL FIXATION (ORIF) TIBIAL PLATEAU;  Surgeon: Altamese Paisley, MD;  Location: [facility];  Service: Orthopedics;  Laterality: Left;   TRANSTHORACIC ECHOCARDIOGRAM  07/2016    EF 60-65%, No RWMA. Mod Concentric LVH - Gr 2 DD. Severe LA dilation. PAP ~35 mmHg (mild Pulm HTN)  --> no changes noted 1 month later    Prior to Admission medications   Medication Sig Start Date End Date Taking? Authorizing Provider  diltiazem (CARDIZEM CD) 360 MG 24 hr capsule Take 360 mg by mouth daily. 06/19/17  Yes [provider]  lidocaine-prilocaine (EMLA) cream Apply 1 application topically See admin instructions. Tuesday,thursday,Saturday prior to dialysis 05/12/21  Yes [provider]  sevelamer carbonate (RENVELA) 800 MG tablet Take 2 tablets by mouth with breakfast, with lunch, and with evening meal. 05/12/21  Yes [provider]    Scheduled Meds:  (feeding supplement) PROSource Plus  30 mL Oral BID BM   bisacodyl  10 mg Rectal Daily   Chlorhexidine Gluconate Cloth  6 each Topical Q0600   [START ON 06/16/2021] darbepoetin (ARANESP) injection - DIALYSIS  40 mcg Intravenous Q Thu-HD   diltiazem  360 mg Oral Daily   enoxaparin (LOVENOX) injection  30 mg Subcutaneous Q24H   feeding supplement (NEPRO CARB STEADY)  237 mL Oral TID BM   ferric citrate  420 mg Oral TID WC   multivitamin  1 tablet Oral QHS   Infusions:  ferric gluconate (FERRLECIT) IVPB Stopped (06/14/21 1054)   PRN Meds: hydrALAZINE, oxyCODONE-acetaminophen   Allergies as of 06/11/2021 - Review Complete 06/11/2021  Allergen  Reaction Noted   No known allergies  09/18/2016    Family History  Problem Relation Age of Onset   Heart failure Mother        Died at age 55.   Heart attack Mother 61   Hypertension Mother    Diabetes Mellitus II Mother    Other Father        Unknown   Kidney failure Sister        (Oldest Sister)   Other Other        Multiple siblings have started her heart disease, he is not sure of the details.   CAD Nephew     Social History   Socioeconomic History   Marital status: Married    Spouse name: Not on file   Number of children: Not on file   Years of education: Not on file  Highest education level: Not on file  Occupational History   Not on file  Tobacco Use   Smoking status: Former    Packs/day: 0.25    Types: Cigarettes    Quit date: 07/06/2017    Years since quitting: 3.9   Smokeless tobacco: Never  Vaping Use   Vaping Use: Never used  Substance and Sexual Activity   Alcohol use: No    Alcohol/week: 7.0 standard drinks    Types: 7 Cans of beer per week   Drug use: Yes    Frequency: 1.0 times per week    Types: Marijuana, Heroin    Comment: snorts heroin, takes pain pills that he buys off the street   Sexual activity: Not on file    Comment: once a week  Other Topics Concern   Not on file  Social History Narrative   Not on file   Social Determinants of Health   Financial Resource Strain: Not on file  Food Insecurity: Not on file  Transportation Needs: Not on file  Physical Activity: Not on file  Stress: Not on file  Social Connections: Not on file  Intimate Partner Violence: Not on file    REVIEW OF SYSTEMS: Constitutional: Feels tired, weak. ENT:  No nose bleeds Pulm: No shortness of breath or cough. CV:  No palpitations, no LE edema.  No angina. GU:  No hematuria, no frequency GI: See HPI. Heme: Denies unusual or excessive bleeding or bruising. Transfusions: Denies previous transfusion with blood products. Neuro:  No headaches, no  peripheral tingling or numbness.  No syncope, no seizures. Derm:  No itching, no rash or sores.  Endocrine:  No sweats or chills.  No polyuria or dysuria Immunization: Reviewed.  He has been vaccinated and boosted with [place] vaccine and is up-to-date on multiple other vaccinations. Travel:  None beyond local counties in last few months.    PHYSICAL EXAM: Vital signs in last 24 hours: Vitals:   06/15/21 0948 06/15/21 0952  BP: (!) 152/72 (!) 150/72  Pulse:  91  Resp:  17  Temp:  97.7 F (36.5 C)  SpO2:  92%   Wt Readings from Last 3 Encounters:  06/14/21 62.1 kg  05/27/21 66.3 kg  10/25/20 70.2 kg    General: Thin, looks chronically unwell.  Laying in bed and comfortable appearing but complaining of pain. Head: No facial asymmetry or swelling.  No signs of head trauma. Eyes: Conjunctiva pale.  No scleral icterus. Ears: Not hard of hearing Nose: No congestion or discharge Mouth: Oropharynx moist, pink, clear.  Tongue midline. Neck: No JVD, no masses, no thyromegaly Lungs: Diminished breath sounds but clear bilaterally.  No labored breathing, no cough. Heart: RRR.  No MRG.  S1, S2 present. Abdomen: Nondistended.  Bowel sounds normal quality, hypoactive with some additional high-pitched/tinkling bowel sounds.  Tenderness is in the mid abdomen, primarily the upper abdomen.  No guarding, no rebound.  Laparotomy incision at midline is intact and covered with a bit of eschar..   Rectal: Deferred Musc/Skeltl: No joint redness, swelling or gross deformity. Extremities: Pedal edema bil, more pronounced on the left foot Neurologic: Alert.  Oriented x3.  Moves all 4 limbs without tremor.  No gross weakness but did not test strength. Skin: No rash, no sores.  No suspicious lesions. Nodes: No cervical adenopathy Psych:  Moody bordering on petulent.  Does not have a lot to say but provides accurate responses.    Intake/Output from previous day: 08/30 0701 - 08/31  0700 In: 350.2  [NG/GT:240; IV Piggyback:110.2] Out: 1000  Intake/Output this shift: No intake/output data recorded.  LAB RESULTS: Recent Labs    06/13/21 1002 06/14/21 0134 06/15/21 0035  WBC 14.3* 10.6* 12.5*  HGB 12.1* 10.2* 9.4*  HCT 34.7* 29.4* 26.7*  PLT 463* 384 313   BMET Lab Results  Component Value Date   NA 132 (L) 06/15/2021   NA 132 (L) 06/14/2021   NA 134 (L) 06/13/2021   K 4.4 06/15/2021   K 5.5 (H) 06/14/2021   K 5.7 (H) 06/13/2021   CL 93 (L) 06/15/2021   CL 90 (L) 06/14/2021   CL 89 (L) 06/13/2021   CO2 28 06/15/2021   CO2 26 06/14/2021   CO2 30 06/13/2021   GLUCOSE 96 06/15/2021   GLUCOSE 94 06/14/2021   GLUCOSE 83 06/13/2021   BUN 47 (H) 06/15/2021   BUN 73 (H) 06/14/2021   BUN 54 (H) 06/13/2021   CREATININE 5.23 (H) 06/15/2021   CREATININE 7.15 (H) 06/14/2021   CREATININE 6.43 (H) 06/13/2021   CALCIUM 8.0 (L) 06/15/2021   CALCIUM 8.3 (L) 06/14/2021   CALCIUM 9.0 06/13/2021   LFT Recent Labs    06/13/21 1002  ALBUMIN 2.1*   PT/INR Lab Results  Component Value Date   INR 1.11 01/15/2018   INR 1.02 01/13/2018   Hepatitis Panel No results for input(s): HEPBSAG, HCVAB, HEPAIGM, HEPBIGM in the last 72 hours. C-Diff No components found for: CDIFF Lipase     Component Value Date/Time   LIPASE 50 06/11/2021 1427    Drugs of Abuse     Component Value Date/Time   LABOPIA POSITIVE (A) 01/14/2018 1548   COCAINSCRNUR NONE DETECTED 01/14/2018 1548   LABBENZ POSITIVE (A) 01/14/2018 1548   AMPHETMU NONE DETECTED 01/14/2018 1548   THCU POSITIVE (A) 01/14/2018 1548   LABBARB NONE DETECTED 01/14/2018 1548     RADIOLOGY STUDIES: No results found.    IMPRESSION:   Persistent abdominal pain.  Has never resolved following exploratory laparotomy with lysis of adhesions on 05/23/2021 to address SBO.  Pain persists despite some improvement in stool output.  Current imaging more consistent with ileus than obstruction..  There are a number of issues going on  with this patient and include - large volume abdominal pelvic ascites with fluid studies reflecting SBP, not on antibiotics yet.  - Right kidney mass suspicious for cancer.  - Possible splenic infarct or mets/neoplasia.     Colon cancer.  Right hemicolectomy in 2014.  No subsequent surveillance colonoscopies.     HCV infection.  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  LFTs and Lipase normal.       ESRD.  On TTS hemodialysis for about 8 years.     Low BMI of 18.     Anemia of end-stage renal disease.  On Aranesp.  Renal has added Venna fear loading x 3 doses.  Hyponatremia.  Sodium 132.   PLAN:       Cefotaxime.  Standard dose for SBP is 2 g every 8 hours but consulted pharmacy for dosing given ESRD.  The teaching service ordered an ultrasound of the abdomen for further evaluation, await results.      Await cytology results from ascitic fluid.  Check HCV Rosita Fire  06/15/2021, 11:51 AM Phone [phone]  ________________________________________________________________________  Velora Heckler GI MD note:  I personally examined the patient, reviewed the data and agree with the assessment and plan described above.  He is a former alcoholic, stopped 8 years ago, but currently has no signs of cirrhosis (liver looks normal on several CT in past 4 years including two in the past month, normal platelets, normal LFTs early last month, normal INR in 2019 which is last check of coags I can find).  The free fluid in his abd accumulated rapidly after SBO open x lap surgery about one month ago. The fluid tests argue against cirrhosis (SAAG is low), the cell count is highly neutrophilic and the preliminary fluid culture is growing GNRs.  Since SAAG is low and low suspicion for cirrhosis this would be very  unlikely to be SBP.  What is probably more likely is secondary bacterial peritonitis  or perhaps malignant ascites. I agree with covering him with broad spect Abx above and awaiting cytology results.  He had colon cancer removed surgically many years ago, never had colonsocopy since. Also has a suspicious renal lesion. Those would be certainly be on list of possible cancers if this is malignant ascites.  Will follow along.   Owens Loffler, MD [facility] Pager [phone]

## 2021-06-15 NOTE — Progress Notes (Signed)
PT Cancellation Note  Patient Details Name: Timothy [name] MRN: [id number] DOB: 1961-09-06   Cancelled Treatment:    Reason Eval/Treat Not Completed: Patient declined, no reason specified. Pt requested to not do exercises, ambulate, or practice stairs until after he can get something to drink after his ultrasound. Will return later in afternoon, per pt request, as time permits.   Moishe Spice, PT, DPT Acute Rehabilitation Services  Pager: [phone] Office: [place] 06/15/2021, 3:32 PM

## 2021-06-15 NOTE — Evaluation (Signed)
Occupational Therapy Evaluation Patient Details Name: Timothy Villamizar MRN: [id number] DOB: 1961-04-09 Today's Date: 06/15/2021    History of Present Illness Pt is a 60 yo male who presented to [facility] on 8/27 with complaints of abdominal pain, constipation, and swelling. Work up indicating Ileus.  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   Clinical Impression   Pt admitted for concerns listed above. PTA pt reported that he was independent with all ADL's and IADL's, except for driving, which his wife takes him to all appointments. Pt is limited by pain at this time, requiring min guard to min assist for all ADL's and transfers. Pt typically does not use DME for ambulation, requiring 1 person handheld assist at times to maintain balance. As pt progresses and pain decreases, pt should not need further OT services, acute OT will follow to continue to assess progression.     Follow Up Recommendations  No OT follow up;Supervision - Intermittent    Equipment Recommendations  Other (comment) (RW)    Recommendations for Other Services       Precautions / Restrictions Precautions Precautions: None Restrictions Weight Bearing Restrictions: No      Mobility Bed Mobility Overal bed mobility: Needs Assistance Bed Mobility: Supine to Sit;Sit to Supine     Supine to sit: Min assist Sit to supine: Min guard   General bed mobility comments: Pt required min assist for trunk assist during supine to sit. For sit to supine required min guard for safety only as pt impulsively "plopped".    Transfers Overall transfer level: Needs assistance Equipment used: 1 person hand held assist Transfers: Sit to/from Stand Sit to Stand: Min guard         General transfer comment: Min guard for safety    Balance Overall balance assessment: Needs assistance Sitting-balance support: Feet supported;No upper extremity supported Sitting balance-Leahy Scale: Good     Standing  balance support: Single extremity supported;During functional activity Standing balance-Leahy Scale: Fair Standing balance comment: Pt reliant on 1 UE supporting himself during standing activities                           ADL either performed or assessed with clinical judgement   ADL Overall ADL's : Needs assistance/impaired Eating/Feeding: NPO   Grooming: Min guard;Standing   Upper Body Bathing: Min guard;Sitting   Lower Body Bathing: Minimal assistance;Sitting/lateral leans;Sit to/from stand   Upper Body Dressing : Sitting;Min guard   Lower Body Dressing: Minimal assistance;Sitting/lateral leans;Sit to/from stand   Toilet Transfer: Minimal assistance;Ambulation   Toileting- Clothing Manipulation and Hygiene: Minimal assistance;Sitting/lateral lean;Sit to/from stand   Tub/ Shower Transfer: Minimal assistance;Ambulation   Functional mobility during ADLs: Minimal assistance General ADL Comments: Pt limited by pain this session, requiring min guard- min A for all ADL's.     Vision Baseline Vision/History: 0 No visual deficits Ability to See in Adequate Light: 0 Adequate Patient Visual Report: No change from baseline Vision Assessment?: No apparent visual deficits     Perception Perception Perception Tested?: No   Praxis Praxis Praxis tested?: Not tested    Pertinent Vitals/Pain Pain Assessment: 0-10 Pain Score: 7  Pain Location: abdomen Pain Descriptors / Indicators: Discomfort;Grimacing;Dull;Moaning Pain Intervention(s): Limited activity within patient's tolerance;Monitored during session;Premedicated before session     Hand Dominance Right   Extremity/Trunk Assessment Upper Extremity Assessment Upper Extremity Assessment: Overall WFL for tasks assessed   Lower Extremity Assessment Lower Extremity Assessment:  Defer to PT evaluation   Cervical / Trunk Assessment Cervical / Trunk Assessment: Kyphotic   Communication Communication Communication:  No difficulties   Cognition Arousal/Alertness: Awake/alert Behavior During Therapy: Agitated;Impulsive Overall Cognitive Status: No family/caregiver present to determine baseline cognitive functioning                                 General Comments: Pt focused on pain levels and pain meds   General Comments  VSS on RA    Exercises     Shoulder Instructions      Home Living Family/patient expects to be discharged to:: Private residence Living Arrangements: Spouse/significant other;Children Available Help at Discharge: Family;Available PRN/intermittently Type of Home: Apartment Home Access: Stairs to enter Entrance Stairs-Number of Steps: at least 2 flights Entrance Stairs-Rails: Left Home Layout: One level     Bathroom Shower/Tub: Teacher, early years/pre: Standard Bathroom Accessibility: Yes   Home Equipment: None          Prior Functioning/Environment Level of Independence: Independent                 OT Problem List: Decreased strength;Decreased activity tolerance;Impaired balance (sitting and/or standing);Decreased safety awareness;Pain      OT Treatment/Interventions: Self-care/ADL training;Therapeutic exercise;Energy conservation;DME and/or AE instruction;Therapeutic activities;Patient/family education;Balance training    OT Goals(Current goals can be found in the care plan section) Acute Rehab OT Goals Patient Stated Goal: To lessen pain OT Goal Formulation: With patient Time For Goal Achievement: 06/29/21 Potential to Achieve Goals: Good ADL Goals Pt Will Perform Grooming: with modified independence;standing Pt Will Perform Lower Body Bathing: with modified independence;sitting/lateral leans;sit to/from stand Pt Will Perform Lower Body Dressing: with modified independence;sitting/lateral leans;sit to/from stand Pt Will Transfer to Toilet: with modified independence;ambulating Pt Will Perform Toileting - Clothing Manipulation  and hygiene: with modified independence;sitting/lateral leans;sit to/from stand Additional ADL Goal #1: Pt will tolerate OOB tasks for 8 mins to improve activity tolerance  OT Frequency: Min 2X/week   Barriers to D/C:            Co-evaluation              AM-PAC OT "6 Clicks" Daily Activity     Outcome Measure Help from another person eating meals?: None Help from another person taking care of personal grooming?: A Little Help from another person toileting, which includes using toliet, bedpan, or urinal?: A Little Help from another person bathing (including washing, rinsing, drying)?: A Little Help from another person to put on and taking off regular upper body clothing?: A Little Help from another person to put on and taking off regular lower body clothing?: A Little 6 Click Score: 19   End of Session Nurse Communication: Mobility status;Patient requests pain meds  Activity Tolerance: Patient limited by pain Patient left: in bed;with call bell/phone within reach  OT Visit Diagnosis: Unsteadiness on feet (R26.81);Other abnormalities of gait and mobility (R26.89);Muscle weakness (generalized) (M62.81)                Time: 1242-1300 OT Time Calculation (min): 18 min Charges:  OT General Charges $OT Visit: 1 Visit OT Evaluation $OT Eval Moderate Complexity: 1 Mod  Laurali Goddard H., OTR/L Acute Rehabilitation  Roxanne Panek Elane Yolanda Bonine 06/15/2021, 1:31 PM

## 2021-06-16 ENCOUNTER — Inpatient Hospital Stay (HOSPITAL_COMMUNITY): Payer: Medicare HMO

## 2021-06-16 DIAGNOSIS — K567 Ileus, unspecified: Secondary | ICD-10-CM | POA: Diagnosis not present

## 2021-06-16 DIAGNOSIS — R161 Splenomegaly, not elsewhere classified: Secondary | ICD-10-CM | POA: Diagnosis not present

## 2021-06-16 DIAGNOSIS — R188 Other ascites: Secondary | ICD-10-CM | POA: Diagnosis not present

## 2021-06-16 DIAGNOSIS — K659 Peritonitis, unspecified: Secondary | ICD-10-CM

## 2021-06-16 LAB — CULTURE, BODY FLUID W GRAM STAIN -BOTTLE

## 2021-06-16 MED ORDER — SODIUM CHLORIDE 0.9 % IV SOLN
2.0000 g | INTRAVENOUS | Status: AC
  Administered 2021-06-16 – 2021-06-24 (×9): 2 g via INTRAVENOUS
  Filled 2021-06-16 (×10): qty 20

## 2021-06-16 MED ORDER — SENNA 8.6 MG PO TABS: 1.0000 | ORAL_TABLET | Freq: Every day | ORAL | Status: DC | PRN

## 2021-06-16 MED ORDER — HYDRALAZINE HCL 25 MG PO TABS
25.0000 mg | ORAL_TABLET | Freq: Once | ORAL | Status: AC
  Administered 2021-06-16: 25 mg via ORAL
  Filled 2021-06-16: qty 1

## 2021-06-16 MED ORDER — SENNA 8.6 MG PO TABS
1.0000 | ORAL_TABLET | Freq: Every day | ORAL | Status: DC
  Administered 2021-06-16 – 2021-06-25 (×10): 8.6 mg via ORAL
  Filled 2021-06-16 (×10): qty 1

## 2021-06-16 MED ORDER — POLYETHYLENE GLYCOL 3350 17 G PO PACK
17.0000 g | PACK | Freq: Every day | ORAL | Status: DC | PRN
  Administered 2021-06-19 – 2021-06-20 (×2): 17 g via ORAL
  Filled 2021-06-16 (×2): qty 1

## 2021-06-16 MED ORDER — OXYCODONE HCL 5 MG PO TABS
5.0000 mg | ORAL_TABLET | Freq: Once | ORAL | Status: AC
  Administered 2021-06-16: 5 mg via ORAL
  Filled 2021-06-16: qty 1

## 2021-06-16 NOTE — Progress Notes (Signed)
PT Cancellation Note  Patient Details Name: Timothy [name] MRN: [id number] DOB: [birth year]   Cancelled Treatment:    Reason Eval/Treat Not Completed: (P) Patient declined, no reason specified (He continues to decline despite education and encouragement.  Will attempt tomorrow if he declines, will sign off on patient per policy.)   Holger Sokolowski Eli Hose 06/16/2021, 11:26 AM  Erasmo Leventhal , PTA Acute Rehabilitation Services Pager [phone] Office [phone]

## 2021-06-16 NOTE — Progress Notes (Addendum)
[facility] Progress Note   Subjective: Seen in room, still C/O abdominal pain. Getting pain meds, abdomen is distended and taut. HD later today on schedule.   Objective Vitals:   06/15/21 1620 06/15/21 2015 06/16/21 0411 06/16/21 0902  BP: (!) 172/96 (!) 146/84 (!) 170/84 (!) 175/94  Pulse: 83 78 81 81  Resp: 18 17 17 16   Temp: 98.8 F (37.1 C) 98.5 F (36.9 C) 97.7 F (36.5 C) (!) 97.5 F (36.4 C)  TempSrc: Oral Oral Oral Oral  SpO2: 93% 94% 93% 96%  Weight:      Height:       Physical Exam General: Thin, Chronically ill appearing male in NAD Heart: S1,S2, RRR no M/R/G Lungs: CTAB A/P Abdomen: appears distended, firm. Active BS, mild tenderness to palpation.  Extremities: No LE edema Dialysis Access: L AVF + T/B pulsatile with thinning area lower portion. Monitor.        Additional Objective Labs: Basic Metabolic Panel: Recent Labs  Lab 06/12/21 0918 06/13/21 1002 06/14/21 0134 06/15/21 0035  NA 137 134* 132* 132*  K 4.4 5.7* 5.5* 4.4  CL 94* 89* 90* 93*  CO2 31 30 26 28   GLUCOSE 68* 83 94 96  BUN 30* 54* 73* 47*  CREATININE 4.92* 6.43* 7.15* 5.23*  CALCIUM 8.8* 9.0 8.3* 8.0*  PHOS 6.3* 9.1*  --   --    Liver Function Tests: Recent Labs  Lab 06/11/21 1427 06/12/21 0918 06/13/21 1002  AST 18  --   --   ALT 9  --   --   ALKPHOS 59  --   --   BILITOT 0.6  --   --   PROT 6.4*  --   --   ALBUMIN 2.4* 2.1* 2.1*   Recent Labs  Lab 06/11/21 1427  LIPASE 50   CBC: Recent Labs  Lab 06/11/21 1427 06/12/21 0918 06/13/21 1002 06/14/21 0134 06/15/21 0035  WBC 12.3* 15.2* 14.3* 10.6* 12.5*  NEUTROABS 10.1* 13.1* 12.3* 9.2*  --   HGB 10.5* 11.6* 12.1* 10.2* 9.4*  HCT 31.4* 34.3* 34.7* 29.4* 26.7*  MCV 89.2 87.7 85.9 85.7 84.8  PLT 342 369 463* 384 313   Blood Culture    Component Value Date/Time   SDES FLUID ABDOMEN 06/13/2021 1504   SDES FLUID ABDOMEN 06/13/2021 1504   SPECREQUEST BOTTLES DRAWN AEROBIC AND ANAEROBIC  06/13/2021 1504   SPECREQUEST NONE 06/13/2021 1504   CULT ESCHERICHIA COLI (A) 06/13/2021 1504   REPTSTATUS 06/16/2021 FINAL 06/13/2021 1504   REPTSTATUS 06/13/2021 FINAL 06/13/2021 1504    Cardiac Enzymes: No results for input(s): CKTOTAL, CKMB, CKMBINDEX, TROPONINI in the last 168 hours. CBG: No results for input(s): GLUCAP in the last 168 hours. Iron Studies: No results for input(s): IRON, TIBC, TRANSFERRIN, FERRITIN in the last 72 hours. @lablastinr3 @ Studies/Results: US Abdomen Complete  Result Date: 06/15/2021 CLINICAL DATA:  Abdominal pain 5 days.  ESRD.  Polysubstance abuse. EXAM: ABDOMEN ULTRASOUND COMPLETE COMPARISON:  CT abdomen pelvis 06/11/2021 FINDINGS: Gallbladder: Gallstones and gallbladder sludge. Gallbladder wall thickening up to 6 mm. Negative sonographic Murphy sign. Common bile duct: Diameter: 4 mm Liver: Mild increased echogenicity liver diffusely. No focal liver mass lesion. Portal vein is patent on color Doppler imaging with normal direction of blood flow towards the liver. IVC: No abnormality visualized. Pancreas: No pancreatic mass or edema. Pancreatic duct is prominent at 5 mm. No change from the prior CT. Spleen: Size and appearance within normal limits. Right Kidney: Length: 8 cm. Increased  echogenicity of the cortex bilaterally. Numerous cysts. Solid mass 2.6 cm right upper pole as noted on CT. Possible neoplasm. Negative for hydronephrosis Left Kidney: Length: 8.4 cm. Increased echogenicity. Multiple cysts. Negative for hydronephrosis. Abdominal aorta: Atherosclerotic aorta without aneurysm. Other findings: Large amount of ascites throughout the abdomen. IMPRESSION: 1. Multiple gallstones and gallbladder sludge. Gallbladder wall thickening 6 mm. Negative sonographic Murphy sign. Possible acute or chronic cholecystitis. 2. Large amount of ascites 3. Small kidneys with multiple cysts. No obstruction. 2.6 cm solid mass right upper pole, possible neoplasm. 4. Pancreatic  duct dilatation. Electronically Signed   By: Franchot Gallo M.D.   On: 06/15/2021 19:19   Medications:  cefTRIAXone (ROCEPHIN)  IV     ferric gluconate (FERRLECIT) IVPB Stopped (06/14/21 1054)    (feeding supplement) PROSource Plus  30 mL Oral BID BM   bisacodyl  10 mg Rectal Daily   Chlorhexidine Gluconate Cloth  6 each Topical Q0600   darbepoetin (ARANESP) injection - DIALYSIS  40 mcg Intravenous Q Thu-HD   diltiazem  360 mg Oral Daily   enoxaparin (LOVENOX) injection  30 mg Subcutaneous Q24H   feeding supplement (NEPRO CARB STEADY)  237 mL Oral TID BM   ferric citrate  420 mg Oral TID WC   multivitamin  1 tablet Oral QHS     HD orders: [place] 3 h 45 min 180NRe 450/Autoflow 1.5 2.0K/2.0 Ca 65 kgs  UFP 4 AVF -No heparin -Mircera 50 mcg IV q 4 weeks (Last dose 05/12/2021)   Assessment/Plan: 1. Ileus-per primary. CCS following 2. ESRD -T,Th,S Next HD 06/16/2021 3. Anemia - HGB 9.4. ESA due tomorrow. Ordered.  4. Secondary hyperparathyroidism - Continued binders 5. HTN/volume - BP controlled. HD 08/30 Net UF 1 Liter. Na 132 attempt to lower volume a bit as tolerated.  6. Nutrition - resumed diet today. Albumin 2.1. Order protein supplements.  7. H/O renal mass-following as OP  Alayja Armas H. Oziel Beitler NP-C 06/16/2021, 11:39 AM  [employer] [phone]

## 2021-06-16 NOTE — Progress Notes (Addendum)
[facility] Progress Note  CC:  Abdominal pain and peritonitis  Subjective:  Sleepy and reports abdominal bloating, but no pain right now.  Waiting to go to dialysis.  Objective:  Vital signs in last 24 hours: Temp:  [97.5 F (36.4 C)-98.8 F (37.1 C)] 97.5 F (36.4 C) (09/01 0902) Pulse Rate:  [78-83] 81 (09/01 0902) Resp:  [16-18] 16 (09/01 0902) BP: (146-175)/(84-96) 175/94 (09/01 0902) SpO2:  [93 %-96 %] 96 % (09/01 0902) Last BM Date: 06/13/21 General:  Alert, Well-developed, n NAD Heart:  Regular rate and rhythm; no murmurs Pulm:  CTAB.  No W/R/R. Abdomen:  Soft, somewhat distended.  BS present and somewhat tinkling.  Non-tender.  Laparotomy incision at midline is intact and covered with a bit of eschar. Extremities:  Without edema.  Lab Results: Recent Labs    06/14/21 0134 06/15/21 0035  WBC 10.6* 12.5*  HGB 10.2* 9.4*  HCT 29.4* 26.7*  PLT 384 313   BMET Recent Labs    06/14/21 0134 06/15/21 0035  NA 132* 132*  K 5.5* 4.4  CL 90* 93*  CO2 26 28  GLUCOSE 94 96  BUN 73* 47*  CREATININE 7.15* 5.23*  CALCIUM 8.3* 8.0*    US Abdomen Complete  Result Date: 06/15/2021 CLINICAL DATA:  Abdominal pain 5 days.  ESRD.  Polysubstance abuse. EXAM: ABDOMEN ULTRASOUND COMPLETE COMPARISON:  CT abdomen pelvis 06/11/2021 FINDINGS: Gallbladder: Gallstones and gallbladder sludge. Gallbladder wall thickening up to 6 mm. Negative sonographic Murphy sign. Common bile duct: Diameter: 4 mm Liver: Mild increased echogenicity liver diffusely. No focal liver mass lesion. Portal vein is patent on color Doppler imaging with normal direction of blood flow towards the liver. IVC: No abnormality visualized. Pancreas: No pancreatic mass or edema. Pancreatic duct is prominent at 5 mm. No change from the prior CT. Spleen: Size and appearance within normal limits. Right Kidney: Length: 8 cm. Increased echogenicity of the cortex bilaterally. Numerous cysts. Solid mass 2.6 cm  right upper pole as noted on CT. Possible neoplasm. Negative for hydronephrosis Left Kidney: Length: 8.4 cm. Increased echogenicity. Multiple cysts. Negative for hydronephrosis. Abdominal aorta: Atherosclerotic aorta without aneurysm. Other findings: Large amount of ascites throughout the abdomen. IMPRESSION: 1. Multiple gallstones and gallbladder sludge. Gallbladder wall thickening 6 mm. Negative sonographic Murphy sign. Possible acute or chronic cholecystitis. 2. Large amount of ascites 3. Small kidneys with multiple cysts. No obstruction. 2.6 cm solid mass right upper pole, possible neoplasm. 4. Pancreatic duct dilatation. Electronically Signed   By: Franchot Gallo M.D.   On: 06/15/2021 19:19    Assessment / Plan: Persistent abdominal pain.  Has never resolved following exploratory laparotomy with lysis of adhesions on 05/23/2021 to address SBO.  Pain persists despite some improvement in stool output.  Current imaging more consistent with ileus than obstruction..  There are a number of issues going on with this patient and include - large volume abdominal pelvic ascites with fluid studies reflecting SBP. - Right kidney mass suspicious for cancer.  - Possible splenic infarct or mets/neoplasia.  It does not look like he has cirrhosis that is causing his ascites, none seen on imaging and labs do not reflect it with low SAAG, normal platelets, INR, etc.  Cytology negative.  Likely a secondary bacterial peritonitis, ? Related to his surgery.   History colon cancer:  Right hemicolectomy in 2014.  No subsequent surveillance colonoscopies.   HCV infection.  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  Pt reports was treated  with antivirals within the past 12 to 18 months and relays satisfactory response on viral load studies post therapy (within the past 2 months per patient report).  LFTs and Lipase normal.  HCV RNA pending.    ESRD.  On TTS hemodialysis for about 8 years.    Low BMI of 18.   Anemia of end-stage  renal disease.  On Aranesp.  Renal has added Venofer loading x 3 doses.   Hyponatremia.  Sodium 132.  -Would continue abx.  Was switched to rocephin as cefotaxime is hard to come by and expensive. -Would ask  surgery to see if this could be related to his surgery last month.     LOS: 4 days   Timothy [name]. Timothy [name]  06/16/2021, 11:49 AM  ________________________________________________________________________  Timothy [name] GI MD note:  I personally examined the patient, reviewed the data and agree with the assessment and plan described above.  No radiographic or laboratory evidence that he has cirrhosis. SAAG < 1.1 actually argues against portal hypertension quite strongly.  Overall the ascites fluid analysis favors this being a secondary bacterial peritonitis. The fluid is growing E. Coli and will need to tailor his antibiotics according to sensitivities noted by lab and should ask surgery about the above possibility perhaps indicating a delayed complication from his laparotomy 3-4 weeks ago. Will follow along.  Timothy Loffler, MD [facility] Pager [phone]

## 2021-06-16 NOTE — Care Management Important Message (Signed)
Important Message  Patient Details  Name: Timothy [name] MRN: [id number] Date of Birth: October 25, 1960   Medicare Important Message Given:  Yes     Grayce Budden 06/16/2021, 8:59 AM

## 2021-06-16 NOTE — Progress Notes (Signed)
Pt refused labs this am.

## 2021-06-16 NOTE — Progress Notes (Signed)
Nutrition Follow-up  DOCUMENTATION CODES:   Severe malnutrition in context of chronic illness  INTERVENTION:   Encourage smaller, more frequent meals  Continue Renal MVI daily  Continue 30 ml ProSource Plus BID, each supplement provides 100 kcals and 15 grams protein.   Continue Nepro Shake po TID, each supplement provides 425 kcal and 19 grams protein  NUTRITION DIAGNOSIS:   Severe Malnutrition related to chronic illness (ESRD on HD) as evidenced by severe fat depletion, severe muscle depletion.  Being addressed via diet advancement, supplements  GOAL:   Patient will meet greater than or equal to 90% of their needs  Progressing  MONITOR:   PO intake, Supplement acceptance, Diet advancement, Weight trends, Labs, I & O's  REASON FOR ASSESSMENT:   Consult Assessment of nutrition requirement/status  ASSESSMENT:   Pt with a PMH significant for ESRD on HD, colon Ca s/p resection, HFpEF, HTN, and polysubstance abuse admitted with an ileus.  +renal mass suspicious for cancer  Abdominal pain persists, like bacterial peritonitis.  +flatus, +abd bloating. Pt with large volume ascites Recorded po intake 100% at breakfast this AM, 75% at dinner last night.   EDW 65 kg; current wt 62.1 kg  Potassium 3.7->4.4->5.7->5.5->4.4  Labs: phosphorus 9.1 (H), potassium 4.4 (wdl) Meds: dulcolax, aranesp, ferric gluconate, auryxia, Rena-Vite   Diet Order:   Diet Order             Diet renal/carb modified with fluid restriction Diet-HS Snack? Nothing; Fluid restriction: 1200 mL Fluid; Room service appropriate? Yes; Fluid consistency: Thin  Diet effective now                   EDUCATION NEEDS:   No education needs have been identified at this time  Skin:  Skin Assessment: Reviewed RN Assessment  Last BM:  8/28  Height:   Ht Readings from Last 1 Encounters:  06/15/21 6\' 1"  (1.854 m)    Weight:   Wt Readings from Last 1 Encounters:  06/14/21 62.1 kg     Ideal Body Weight:     BMI:  Body mass index is 18.06 kg/m.  Estimated Nutritional Needs:   Kcal:  6073-7106  Protein:  125-150 grams  Fluid:  1L+UOP   Kerman Passey MS, RDN, LDN, CNSC Registered Dietitian III Clinical Nutrition RD Pager and On-Call Pager Number Located in [place]

## 2021-06-16 NOTE — Progress Notes (Addendum)
   Subjective:  Patient notes that his pain in his abdomen briefly improved with antibiotics. He feels the swelling in his abdomen has worsened over the last couple of days, particularly after eating. He now has pain at the incision site from his recent ex-lap.   Objective:  Vital signs in last 24 hours: Vitals:   06/16/21 1321 06/16/21 1400 06/16/21 1415 06/16/21 1430  BP: (!) 170/91 (!) 169/92 (!) 125/93 (!) 158/88  Pulse: 79 82 79 82  Resp:      Temp:      TempSrc:      SpO2:      Weight:      Height:       Gen: NAD CV: RRR Pulm: Non labored breathing on RA, no wheezing or crackles Abd: Soft, distended, midline incision w/ no drainage or surrounding erythema, incision R abd from former peritoneal dialysis site non erythematous Ext: No edema of BLE  Neuro: Non focal    Assessment/Plan:  Active Problems:   Ileus (HCC)   Protein-calorie malnutrition, severe  Timothy [name] is a 60 yo M with PMHx of ESRD on HD TTS, colon cancer s/p resection, HFpEF, HTN, and polysubstance use who p/w abdominal pain and constipation.   Ileus, resolving Persistent abdominal pain primarily in midline where his ex-lap incision is. He is tolerating a diet, having a bowel movement though his last BM was yesterday, with no nausea or vomiting. He feels his pain improved with antibiotics and with paracentesis. Patient has persistent pain which is likely multifactorial and also related to his SBP infection. Large volume abdominal ascites is likely also contributing.  -Continue ceftriaxone per Gi will follow sensitivities, c/s gen surg in the AM regarding possibility of this being a complication of his recent surgery.  -Percocet PRN for pain control  -Bowel regimen, f/u abd x ray  Bacterial Peritonitis Large Volume Abdominal Ascites:  Developed abdominal distention after recent ex-lap per patient. Underwent Diagnostic and therapeutic paracentesis this hospitalization. 1.85 L of fluid was removed. SAAG  <1.1. Cytology equivocal noting reactive mesothelial cells which can be seen in malignancy and infection. Ascitic Fluid culture growing E. Coli and c/f secondary bacterial peritonitis.  Source of patient's ascites remains unclear. Patient without evidence of portal hypertension and GI agrees this is not consistent with cirrhosis. Possibly related to malignancy given renal mass or potential metastatic disease from he previous colon cancer. Possible chronic cholecystitis observed on recent abd ultrasound could also be contributing to his recurrent ascitic fluid, though this could also be a complication from his ascites.  -Patient may require repeat paracentesis  -Continue antibiotics per above   ESRD c/b ACD:  HD TTS  Hypertension:  Patient's blood pressure elevated in the 150s to 170s. Scheduled for HD this PM.  -Continue diltiazem 360mg  qd -IV hydralazine q4h PRN  Renal mass Hypodense R renal mass measuring 3.4 cm. Nephrology is recommended outpatient follow up. But the setting of possible malignant ascites and splenic mets though, will reach out to discuss. May benefit from inpatient urology consult for further work up.   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 Concerning for splenic infarct v neoplastic lesion.   Diet: Renal diet.  VTEppx: Enoxaparin 30mg   Dispo: Pending clinical course   Rick Duff, MD 06/16/2021, 2:52 PM Pager: [phone] After 5pm on weekdays and 1pm on weekends: On Call pager [phone]

## 2021-06-17 ENCOUNTER — Inpatient Hospital Stay (HOSPITAL_COMMUNITY): Payer: Medicare HMO

## 2021-06-17 DIAGNOSIS — R161 Splenomegaly, not elsewhere classified: Secondary | ICD-10-CM | POA: Diagnosis not present

## 2021-06-17 DIAGNOSIS — K659 Peritonitis, unspecified: Secondary | ICD-10-CM | POA: Diagnosis not present

## 2021-06-17 DIAGNOSIS — R188 Other ascites: Secondary | ICD-10-CM | POA: Diagnosis not present

## 2021-06-17 LAB — BODY FLUID CELL COUNT WITH DIFFERENTIAL
Eos, Fluid: 0 %
Lymphs, Fluid: 53 %
Monocyte-Macrophage-Serous Fluid: 9 % — ABNORMAL LOW (ref 50–90)
Neutrophil Count, Fluid: 38 % — ABNORMAL HIGH (ref 0–25)
Total Nucleated Cell Count, Fluid: 1221 cu mm — ABNORMAL HIGH (ref 0–1000)

## 2021-06-17 LAB — PROTEIN, TOTAL: Total Protein: 5.6 g/dL — ABNORMAL LOW (ref 6.5–8.1)

## 2021-06-17 LAB — ALBUMIN, PLEURAL OR PERITONEAL FLUID: Albumin, Fluid: 1.4 g/dL

## 2021-06-17 LAB — GLUCOSE, PLEURAL OR PERITONEAL FLUID: Glucose, Fluid: 95 mg/dL

## 2021-06-17 LAB — PROTEIN, PLEURAL OR PERITONEAL FLUID: Total protein, fluid: 3.8 g/dL

## 2021-06-17 LAB — LACTATE DEHYDROGENASE, PLEURAL OR PERITONEAL FLUID: LD, Fluid: 107 U/L — ABNORMAL HIGH (ref 3–23)

## 2021-06-17 MED ORDER — FENTANYL CITRATE PF 50 MCG/ML IJ SOSY
25.0000 ug | PREFILLED_SYRINGE | Freq: Once | INTRAMUSCULAR | Status: AC
Start: 2021-06-17 — End: 2021-06-17
  Administered 2021-06-17: 25 ug via INTRAVENOUS
  Filled 2021-06-17: qty 1

## 2021-06-17 MED ORDER — ONDANSETRON HCL 4 MG/2ML IJ SOLN: 4.0000 mg | Freq: Once | INTRAMUSCULAR | Status: DC

## 2021-06-17 MED ORDER — HYDROMORPHONE HCL 1 MG/ML IJ SOLN
0.5000 mg | INTRAMUSCULAR | Status: DC | PRN
  Administered 2021-06-17 – 2021-06-20 (×17): 0.5 mg via INTRAVENOUS
  Filled 2021-06-17 (×17): qty 0.5

## 2021-06-17 MED ORDER — HYDRALAZINE HCL 25 MG PO TABS
25.0000 mg | ORAL_TABLET | Freq: Every day | ORAL | Status: DC
  Administered 2021-06-17 – 2021-06-22 (×6): 25 mg via ORAL
  Filled 2021-06-17 (×6): qty 1

## 2021-06-17 MED ORDER — IOHEXOL 350 MG/ML SOLN
100.0000 mL | Freq: Once | INTRAVENOUS | Status: AC | PRN
  Administered 2021-06-17: 100 mL via INTRAVENOUS

## 2021-06-17 MED ORDER — IOHEXOL 9 MG/ML PO SOLN
ORAL | Status: AC
  Administered 2021-06-17: 500 mL
  Filled 2021-06-17: qty 1000

## 2021-06-17 MED ORDER — CALCIUM ACETATE (PHOS BINDER) 667 MG PO CAPS
667.0000 mg | ORAL_CAPSULE | Freq: Three times a day (TID) | ORAL | Status: DC
  Administered 2021-06-17 (×2): 667 mg via ORAL
  Filled 2021-06-17 (×2): qty 1

## 2021-06-17 MED ORDER — LIDOCAINE HCL (PF) 1 % IJ SOLN
INTRAMUSCULAR | Status: AC
  Filled 2021-06-17: qty 30

## 2021-06-17 NOTE — Progress Notes (Signed)
[facility] Progress Note   Subjective:  Seen in room. Abdomen still tense and tender. No CP/dyspnea.  Objective Vitals:   06/16/21 2052 06/16/21 2227 06/17/21 0503 06/17/21 0800  BP: (!) 185/101 (!) 159/98 (!) 142/68 (!) 180/96  Pulse: 98 98 95 100  Resp:    16  Temp: 98.6 F (37 C)  98.4 F (36.9 C) 98 F (36.7 C)  TempSrc: Oral  Oral Oral  SpO2: 95%  97% 97%  Weight:      Height:       Physical Exam General: Frail man, NAD. Skin appears diffusely dry. Heart: RRR; no murmur Lungs: CTAB; no rales Abdomen: tense, mild tenderness Extremities: No LE edema Dialysis Access: LUE AVF  Additional Objective Labs: Basic Metabolic Panel: Recent Labs  Lab 06/12/21 0918 06/13/21 1002 06/14/21 0134 06/15/21 0035  NA 137 134* 132* 132*  K 4.4 5.7* 5.5* 4.4  CL 94* 89* 90* 93*  CO2 31 30 26 28   GLUCOSE 68* 83 94 96  BUN 30* 54* 73* 47*  CREATININE 4.92* 6.43* 7.15* 5.23*  CALCIUM 8.8* 9.0 8.3* 8.0*  PHOS 6.3* 9.1*  --   --    Liver Function Tests: Recent Labs  Lab 06/11/21 1427 06/12/21 0918 06/13/21 1002  AST 18  --   --   ALT 9  --   --   ALKPHOS 59  --   --   BILITOT 0.6  --   --   PROT 6.4*  --   --   ALBUMIN 2.4* 2.1* 2.1*   CBC: Recent Labs  Lab 06/11/21 1427 06/12/21 0918 06/13/21 1002 06/14/21 0134 06/15/21 0035  WBC 12.3* 15.2* 14.3* 10.6* 12.5*  NEUTROABS 10.1* 13.1* 12.3* 9.2*  --   HGB 10.5* 11.6* 12.1* 10.2* 9.4*  HCT 31.4* 34.3* 34.7* 29.4* 26.7*  MCV 89.2 87.7 85.9 85.7 84.8  PLT 342 369 463* 384 313   Blood Culture    Component Value Date/Time   SDES FLUID ABDOMEN 06/13/2021 1504   SDES FLUID ABDOMEN 06/13/2021 1504   SPECREQUEST BOTTLES DRAWN AEROBIC AND ANAEROBIC 06/13/2021 1504   SPECREQUEST NONE 06/13/2021 1504   CULT ESCHERICHIA COLI (A) 06/13/2021 1504   REPTSTATUS 06/16/2021 FINAL 06/13/2021 1504   REPTSTATUS 06/13/2021 FINAL 06/13/2021 1504   Studies/Results: DG Abd 1 View  Result Date:  06/16/2021 CLINICAL DATA:  Abdominal pain. EXAM: ABDOMEN - 1 VIEW COMPARISON:  CT of the abdomen pelvis dated 06/11/2021. FINDINGS: Several normal caliber air-filled loops of small bowel in the right hemiabdomen. No bowel dilatation or evidence of obstruction. Moderate stool throughout the colon. No free air. Multiple surgical clips. Atherosclerotic calcification of the aorta. No acute osseous pathology. IMPRESSION: Nonobstructive bowel gas pattern. Electronically Signed   By: Anner Crete M.D.   On: 06/16/2021 21:22   US Abdomen Complete  Result Date: 06/15/2021 CLINICAL DATA:  Abdominal pain 5 days.  ESRD.  Polysubstance abuse. EXAM: ABDOMEN ULTRASOUND COMPLETE COMPARISON:  CT abdomen pelvis 06/11/2021 FINDINGS: Gallbladder: Gallstones and gallbladder sludge. Gallbladder wall thickening up to 6 mm. Negative sonographic Murphy sign. Common bile duct: Diameter: 4 mm Liver: Mild increased echogenicity liver diffusely. No focal liver mass lesion. Portal vein is patent on color Doppler imaging with normal direction of blood flow towards the liver. IVC: No abnormality visualized. Pancreas: No pancreatic mass or edema. Pancreatic duct is prominent at 5 mm. No change from the prior CT. Spleen: Size and appearance within normal limits. Right Kidney: Length: 8 cm. Increased echogenicity of  the cortex bilaterally. Numerous cysts. Solid mass 2.6 cm right upper pole as noted on CT. Possible neoplasm. Negative for hydronephrosis Left Kidney: Length: 8.4 cm. Increased echogenicity. Multiple cysts. Negative for hydronephrosis. Abdominal aorta: Atherosclerotic aorta without aneurysm. Other findings: Large amount of ascites throughout the abdomen. IMPRESSION: 1. Multiple gallstones and gallbladder sludge. Gallbladder wall thickening 6 mm. Negative sonographic Murphy sign. Possible acute or chronic cholecystitis. 2. Large amount of ascites 3. Small kidneys with multiple cysts. No obstruction. 2.6 cm solid mass right upper  pole, possible neoplasm. 4. Pancreatic duct dilatation. Electronically Signed   By: Franchot Gallo M.D.   On: 06/15/2021 19:19    Medications:  cefTRIAXone (ROCEPHIN)  IV 2 g (06/16/21 1820)   ferric gluconate (FERRLECIT) IVPB Stopped (06/16/21 1727)    (feeding supplement) PROSource Plus  30 mL Oral BID BM   bisacodyl  10 mg Rectal Daily   calcium acetate  667 mg Oral TID WC   Chlorhexidine Gluconate Cloth  6 each Topical Q0600   darbepoetin (ARANESP) injection - DIALYSIS  40 mcg Intravenous Q Thu-HD   diltiazem  360 mg Oral Daily   enoxaparin (LOVENOX) injection  30 mg Subcutaneous Q24H   feeding supplement (NEPRO CARB STEADY)  237 mL Oral TID BM   ferric citrate  420 mg Oral TID WC   hydrALAZINE  25 mg Oral Daily   multivitamin  1 tablet Oral QHS   senna  1 tablet Oral Daily    Dialysis Orders: TTS at [facility] 3:45 min 180NRe 450/Autoflow 1.5 2.0K/2.0 Ca 65 kgs  UFP 4 AVF - No heparin - Mircera 50 mcg IV q 4 weeks (Last dose 05/12/2021)  Assessment/Plan: 1. Ileus: Resolving. 2. E.Coli peritonitis: Cx 8/29 +. On Ceftriaxone. Surgery following. GB sludge. Repeat paracentesis + abdominal imaging pending. 2. ESRD: Continue HD per TTS schedule - next HD tomorrow. No heparin. 3. Anemia: Hgb 9.4, continue Aranesp q Thursday. Hold IV iron for now. 4. Secondary hyperparathyroidism: Ca ok, Phos very high - continue Auryxia as binder. 5. HTN/volume: BP high, below prior EDW. No volume excess on exam except abdomen. 6. Nutrition: Alb 2.1 (low), continue protein supplements. 7. R renal mass: Needs OP follow-up. 8. Hep C    Veneta Penton, PA-C 06/17/2021, 12:46 PM  [facility]

## 2021-06-17 NOTE — Progress Notes (Signed)
   Subjective: Patient continues to have pain in his abdomen. He states his abdomen is more distended than yesterday and he is having some early satiety. He notes his pain is mostly at his incision site. He denies any nausea or vomiting.   Objective:  Vital signs in last 24 hours: Vitals:   06/17/21 0800 06/17/21 1407 06/17/21 1456 06/17/21 1527  BP: (!) 180/96 (!) 162/86 (!) 148/87 (!) 179/85  Pulse: 100 88 81 84  Resp: 16 18 18 15   Temp: 98 F (36.7 C) 98.3 F (36.8 C) 98.3 F (36.8 C) 98.4 F (36.9 C)  TempSrc: Oral Oral Oral Oral  SpO2: 97% 96% 96% 98%  Weight:      Height:       Gen: Uncomfortable CV: RRR Pulm: Non labored breathing on RA, no wheezing or crackles  Abd: Soft, distended, TTP along midline incision, no surrounding erythema Ext: No edema of BLE  Neuro: Non focal   Assessment/Plan:  Active Problems:   Ileus (HCC)   Protein-calorie malnutrition, severe   Bacterial peritonitis (HCC)   Splenic mass   Other ascites   Timothy [name] is a 60 yo M with PMHx of ESRD on HD TTS, colon cancer s/p resection, HFpEF, HTN, and polysubstance use who p/w abdominal pain and constipation.   Ileus, resolving  Persistent abdominal pain mostly in the midline where is ex-lap incision is. He underwent paracentesis this PM with 2.4L removed. Patient reports improved abdominal pain though some mild discomfort at the site of the paracentesis. Fluid studies are pending from this ascitic fluid. Previous ascitic fluid grew E. Coli c/f secondary bacterial peritonitis. His abdominal x ray also showed moderate volume stool burden which could also be contributing.  -Continue ceftriaxone, sensitives resulted.  -Continue bowel regimen  -F/u ascites fluid studies, source of recurrent ascites remains unclear   Bacterial peritonitis Large volume abdominal ascites:  Patient is s/p paracentesis with 1.85L of fluid removed SAAG <1.1. Cytology was equivocal noting reactive mesothelial cells  which can be seen in malignancy and infection. Ascitic fluid culture growing E. Coli and c/f secondary bacterial peritonitis. Source of patient's ascites remains unclear. Patient without evidence of portal hypertension and GI agrees this is not c/w cirrhosis. Possibly related to malignancy given renal mass or potential metastatic disease from previous colon caner, given splenic mass noted on recent CT A/P.  -Per GI plan for CT A/P with PO and IV contrast and CT chest given history of colon cancer and no surveillance imaging.  -S/p paracentesis 9/2 PM will follow up fluid studies -Continue antibiotics per above.   ESRD c/b ACD:  HD TTS, had 2.6L of UF removed 9/1  HTN: Patient's blood pressure remains elevated to 150s to 180s. Though his cuff pressures are being taken on his L forearm with a smaller sized cuff which can artificially elevated BP. Patient is currently symptomatic.  -Continue patient's diltiazem 360mg  qd -Will reassess his blood pressure after HD tomorrow.  -Consider adding hydralazine PO 25mg  TID   Renal mass Hypodense R renal mass measuring 3.4 cm. Urology consulted.  -F/u recommendations.   Splenic Mass Patient with 1.8cm mass in upper spleen. C/f neoplastic lesion.    Diet: Renal Diet VTEppx: lovenox 30mg  qd  Dispo: Pending clinical course  Rick Duff, MD 06/17/2021, 3:55 PM Pager: [phone] After 5pm on weekdays and 1pm on weekends: On Call pager [phone]

## 2021-06-17 NOTE — Progress Notes (Signed)
Physical Therapy Treatment Patient Details Name: Timothy [name] MRN: [id number] DOB: 02/16/1961 Today's Date: 06/17/2021    History of Present Illness Pt is a 60 yo male who presented to [facility] on 06/11/21 with complaints of abdominal pain, constipation, and swelling. Work up indicating Ileus.  Pt with large volume ascites s/p paracentesis 8/29, fluid with E coli and cystology with mesothelial cells. Other dx include colelithiasis.  PMH: ESRD with HD on TTS, hx of colon cancer, CHF, polysubstance abuse, exploratory laparotomy/ adhesiolysis 05/23/21 for small bowel obstruction.    PT Comments    Pt was reluctantly agreeable to walk in the hallway, but not much more (no exercises, no balance practice, just walk).  He moved very quickly (faster than was safe) and did not want to use a device to help with his balance (staggering gait pattern).  Pt reports frustration with his uncomfortable "bloating" which he relates as "fluid".  He was agreeable for PT to check back in on Monday.   Follow Up Recommendations  No PT follow up     Equipment Recommendations  None recommended by PT (pt not agreeable to use a device when offered)    Recommendations for Other Services       Precautions / Restrictions Precautions Precautions: Fall Precaution Comments: very unsteady on his feet    Mobility  Bed Mobility Overal bed mobility: Modified Independent                  Transfers Overall transfer level: Needs assistance Equipment used: None Transfers: Sit to/from Stand Sit to Stand: Supervision         General transfer comment: supervision for safety as pt stood quickly and immediately had a posterior LOB.  Ambulation/Gait Ambulation/Gait assistance: Supervision;Min guard Gait Distance (Feet): 150 Feet Assistive device: None (at times reaching for hallway railing) Gait Pattern/deviations: Step-through pattern;Staggering left;Staggering right     General Gait Details: Pt with staggering  gait pattern reching for hallway rail at times for support, supervision to min guard assist.  Staggering is in both directions, and speed is unsafely fast.   Stairs             Wheelchair Mobility    Modified Rankin (Stroke Patients Only)       Balance Overall balance assessment: Needs assistance Sitting-balance support: Feet supported;No upper extremity supported Sitting balance-Leahy Scale: Good     Standing balance support: No upper extremity supported Standing balance-Leahy Scale: Fair                              Cognition Arousal/Alertness: Awake/alert Behavior During Therapy: Impulsive Overall Cognitive Status: No family/caregiver present to determine baseline cognitive functioning                                 General Comments: This may be his baseline, he is frustrated with his care wanting relief from his "bloating" which he believes to be fluid collection.      Exercises      General Comments        Pertinent Vitals/Pain Pain Assessment: Faces Faces Pain Scale: Hurts little more Pain Location: abdomen Pain Descriptors / Indicators: Discomfort Pain Intervention(s): Limited activity within patient's tolerance;Monitored during session;Repositioned    Home Living                      Prior Function  PT Goals (current goals can now be found in the care plan section) Acute Rehab PT Goals Patient Stated Goal: to get his bloating to go down Progress towards PT goals: Progressing toward goals    Frequency    Min 3X/week      PT Plan Current plan remains appropriate    Co-evaluation              AM-PAC PT "6 Clicks" Mobility   Outcome Measure  Help needed turning from your back to your side while in a flat bed without using bedrails?: None Help needed moving from lying on your back to sitting on the side of a flat bed without using bedrails?: None Help needed moving to and from a bed  to a chair (including a wheelchair)?: A Little Help needed standing up from a chair using your arms (e.g., wheelchair or bedside chair)?: A Little Help needed to walk in hospital room?: A Little Help needed climbing 3-5 steps with a railing? : A Little 6 Click Score: 20    End of Session   Activity Tolerance: Patient limited by pain Patient left: in chair;Other (comment) (on sofa in room)   PT Visit Diagnosis: Muscle weakness (generalized) (M62.81);Unsteadiness on feet (R26.81)     Time: 3403-5248 (no charge was not in there long enough) PT Time Calculation (min) (ACUTE ONLY): 7 min  Charges:    No charge, only with pt 7 mins

## 2021-06-17 NOTE — Progress Notes (Signed)
RN paged that patient was having suicidal thoughts and actively trying to hurt himself. He was seen and evaluated at the bedside and reported his social security got cut off. He said between that and his pain he no longer wanted to live. He said between all of his medical problems, HD, abdominal pain and ascites he did not want to do this anymore. He said he has had previous SI but has never felt so strongly about it or acted on it. He was in the bathroom trying to find a way to hang himself. Discussed with him we want to help and will help treat his pain the best we can. Also stated we may need a goals of care discussion moving forward and patient agreed. He also said he will not refuse care at this point and wants help.   Plan: -Suicide precautions -Telesitter -Adjust pain regimen -Consulted psychiatry and social work     Administrator, Civil Service, [place] PGY-3 IMTS

## 2021-06-17 NOTE — Progress Notes (Signed)
Notified MD that pt refused lab draw this morning

## 2021-06-17 NOTE — Progress Notes (Addendum)
Pt started crying and said he "can't live like this anymore." He "wants to kill himself." "He doesn't want to live like this anymore."  He is in pain all the time. They cut his social security/disability check.   "No one cares" about him.  Notified MD.

## 2021-06-17 NOTE — Procedures (Signed)
   INDICATION: Recurrent ascites, diagnostic and therapeutic paracentesis  PROCEDURE OPERATOR: Rick Duff MD PGY-2 Velna Ochs MD Attending  ATTENDING PHYSICIAN: In Attendance Yes  Ultrasound used to mark location: Yes    CONSENT:  During the informed consent discussion regarding the procedure, or treatment, I explained the following to the patient:  a. Petra Kuba of the procedure or treatment and who will perform the procedure or treatment.  b. Necessity for procedure and the possible benefits.  c. Risks and complications (most common and serious).  d. Alternative treatments and the risks, benefits and side effects of each (including no treatment).  e. Likelihood of the patient achieving his/her goals without this procedure and surgery treatment.  f. Problems that might occur during the recuperation.  g. Conflicts of interest, if any     PROCEDURE SUMMARY:   A time-out was performed. My hands were washed immediately prior to  the procedure. I wore a gown and sterile gloves throughout the procedure. The area was  cleansed and draped in usual sterile fashion using chlorhexidine.  Anesthesia was achieved with 1% lidocaine. The RLQ of the abdomen was  prepped and draped in a sterile fashion using chlorhexidine scrub. 1%  lidocaine was used to numb the skin, soft tissue and peritoneum. The  paracentesis catheter was inserted and advanced with negative pressure  until yellowish colored fluid was aspirated. Approximately 100 mL of ascitic  fluid was collected and sent for laboratory analysis. The catheter was  then connected to the vaccutainer and 2.3 liters of additional ascitic  fluid were drained. The catheter was removed and no leaking was noted. A  bandaid was placed over the puncture wound. The patient tolerated the  procedure well without any immediate complications. Estimated blood loss  was <1cc.

## 2021-06-17 NOTE — Plan of Care (Signed)
  Problem: Clinical Measurements: Goal: Will remain free from infection Outcome: Progressing Goal: Cardiovascular complication will be avoided Outcome: Progressing   

## 2021-06-17 NOTE — Progress Notes (Signed)
Subjective: CC: Called back to see patient Paracentesis fluid with E. Coli and Cytology with mesothelial cells GI following  RUQ [place] w/ gallstones, mild GB wall thickening (48m) but also in the context of ascites Xray with nonobstructive bowel gas pattern No labs to review today  Patient reports that he is tolerating diet (had grits and pancakes this am) but after eating he gets pain just above his umbilicus and in his epigastrium that lasts until he receives pain medication. No n/v. No current pain. He is passing flatus and having BM's with the last this morning.   Objective: Vital signs in last 24 hours: Temp:  [97.4 F (36.3 C)-98.6 F (37 C)] 98 F (36.7 C) (09/02 0800) Pulse Rate:  [79-100] 100 (09/02 0800) Resp:  [16-18] 16 (09/02 0800) BP: (125-185)/(68-102) 180/96 (09/02 0800) SpO2:  [95 %-97 %] 97 % (09/02 0800) Weight:  [63.5 kg-66 kg] 63.5 kg (09/01 1715) Last BM Date: 06/13/21  Intake/Output from previous day: 09/01 0701 - 09/02 0700 In: 600 [P.O.:600] Out: 2700 [Urine:100] Intake/Output this shift: No intake/output data recorded.  PE: Gen:  Alert, NAD, pleasant Lungs: Rate and effort normal Abd: Soft, mild distension with ascites, supraumbilical/epigastric tenderness > RUQ tenderness. +BS. Midline wound well healed.  Psych: A&Ox3  Skin: no rashes noted, warm and dry  Lab Results:  Recent Labs    06/15/21 0035  WBC 12.5*  HGB 9.4*  HCT 26.7*  PLT 313   BMET Recent Labs    06/15/21 0035  NA 132*  K 4.4  CL 93*  CO2 28  GLUCOSE 96  BUN 47*  CREATININE 5.23*  CALCIUM 8.0*   PT/INR No results for input(s): LABPROT, INR in the last 72 hours. CMP     Component Value Date/Time   NA 132 (L) 06/15/2021 0035   K 4.4 06/15/2021 0035   CL 93 (L) 06/15/2021 0035   CO2 28 06/15/2021 0035   GLUCOSE 96 06/15/2021 0035   BUN 47 (H) 06/15/2021 0035   CREATININE 5.23 (H) 06/15/2021 0035   CREATININE 1.59 (H) 07/29/2015 0943   CALCIUM 8.0 (L)  06/15/2021 0035   CALCIUM 8.4 (L) 09/14/2016 1615   PROT 6.4 (L) 06/11/2021 1427   ALBUMIN 2.1 (L) 06/13/2021 1002   AST 18 06/11/2021 1427   ALT 9 06/11/2021 1427   ALKPHOS 59 06/11/2021 1427   BILITOT 0.6 06/11/2021 1427   GFRNONAA 12 (L) 06/15/2021 0035   GFRNONAA 48 (L) 07/29/2015 0943   GFRAA 7 (L) 05/18/2019 2220   GFRAA 56 (L) 07/29/2015 0943   Lipase     Component Value Date/Time   LIPASE 50 06/11/2021 1427    Studies/Results: DG Abd 1 View  Result Date: 06/16/2021 CLINICAL DATA:  Abdominal pain. EXAM: ABDOMEN - 1 VIEW COMPARISON:  CT of the abdomen pelvis dated 06/11/2021. FINDINGS: Several normal caliber air-filled loops of small bowel in the right hemiabdomen. No bowel dilatation or evidence of obstruction. Moderate stool throughout the colon. No free air. Multiple surgical clips. Atherosclerotic calcification of the aorta. No acute osseous pathology. IMPRESSION: Nonobstructive bowel gas pattern. Electronically Signed   By: AAnner CreteM.D.   On: 06/16/2021 21:22   [place]Abdomen Complete  Result Date: 06/15/2021 CLINICAL DATA:  Abdominal pain 5 days.  ESRD.  Polysubstance abuse. EXAM: ABDOMEN ULTRASOUND COMPLETE COMPARISON:  CT abdomen pelvis 06/11/2021 FINDINGS: Gallbladder: Gallstones and gallbladder sludge. Gallbladder wall thickening up to 6 mm. Negative sonographic Murphy sign. Common bile duct:  Diameter: 4 mm Liver: Mild increased echogenicity liver diffusely. No focal liver mass lesion. Portal vein is patent on color Doppler imaging with normal direction of blood flow towards the liver. IVC: No abnormality visualized. Pancreas: No pancreatic mass or edema. Pancreatic duct is prominent at 5 mm. No change from the prior CT. Spleen: Size and appearance within normal limits. Right Kidney: Length: 8 cm. Increased echogenicity of the cortex bilaterally. Numerous cysts. Solid mass 2.6 cm right upper pole as noted on CT. Possible neoplasm. Negative for hydronephrosis Left Kidney:  Length: 8.4 cm. Increased echogenicity. Multiple cysts. Negative for hydronephrosis. Abdominal aorta: Atherosclerotic aorta without aneurysm. Other findings: Large amount of ascites throughout the abdomen. IMPRESSION: 1. Multiple gallstones and gallbladder sludge. Gallbladder wall thickening 6 mm. Negative sonographic Murphy sign. Possible acute or chronic cholecystitis. 2. Large amount of ascites 3. Small kidneys with multiple cysts. No obstruction. 2.6 cm solid mass right upper pole, possible neoplasm. 4. Pancreatic duct dilatation. Electronically Signed   By: Franchot Gallo M.D.   On: 06/15/2021 19:19    Anti-infectives: Anti-infectives (From admission, onward)    Start     Dose/Rate Route Frequency Ordered Stop   06/16/21 2000  cefoTAXime (CLAFORAN) 2 g in dextrose 5 % 50 mL IVPB  Status:  Discontinued        2 g 140 mL/hr over 30 Minutes Intravenous Every 24 hours 06/15/21 1344 06/16/21 0858   06/16/21 1700  cefTRIAXone (ROCEPHIN) 2 g in sodium chloride 0.9 % 100 mL IVPB        2 g 200 mL/hr over 30 Minutes Intravenous Every 24 hours 06/16/21 0858     06/15/21 1430  cefoTAXime (CLAFORAN) 2 g in dextrose 5 % 50 mL IVPB        2 g 140 mL/hr over 30 Minutes Intravenous  Once 06/15/21 1344 06/15/21 1749        Assessment/Plan Hx exploratory laparotomy/ adhesiolysis 05/23/21 for small bowel obstruction - Dr. Donne Hazel S/p previous right hemicolectomy for colon cancer - No radiographic or clinical evidence of sbo  Large volume ascites - s/p paracentesis 8/29. Paracentesis fluid with E. Coli and Cytology with mesothelial cells. Unclear etiology. GI recommending repeat diagnostic paracentesis and to send the fluid for g stain, cell count, culture and sensitivity, amylase, cytology, LDH, glucose and total protein. Defer to IM team. Will repeat a CT scan. Discussed with nephrology, okay for IV contrast. Will also repeat labs today (CBC, CMP, Lipase). Cont abx   Cholelithiasis - RUQ [place] w/  gallstones & sludge, mild GB wall thickening (2m) but also in the context of large amount of ascites. Repeat labs and follow up on CT A/P. 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

## 2021-06-17 NOTE — Progress Notes (Signed)
[facility] Progress Note    Since last GI note: IN past few days he is no better, still with significant abd discomfort. This does not limit him eating fortunately.  He is most uncomfortable at midline incision site.  Objective: Vital signs in last 24 hours: Temp:  [97.4 F (36.3 C)-98.6 F (37 C)] 98.4 F (36.9 C) (09/02 0503) Pulse Rate:  [79-98] 95 (09/02 0503) Resp:  [16-18] 17 (09/01 1809) BP: (125-185)/(68-102) 142/68 (09/02 0503) SpO2:  [95 %-97 %] 97 % (09/02 0503) Weight:  [63.5 kg-66 kg] 63.5 kg (09/01 1715) Last BM Date: 06/13/21 General: alert and oriented times 3 Heart: regular rate and rythm Abdomen: soft, mildly tender throughout, most tender at midline incision (not fluctuant there), normal bowel sounds   Lab Results: Recent Labs    06/15/21 0035  WBC 12.5*  HGB 9.4*  PLT 313  MCV 84.8   Recent Labs    06/15/21 0035  NA 132*  K 4.4  CL 93*  CO2 28  GLUCOSE 96  BUN 47*  CREATININE 5.23*  CALCIUM 8.0*    Medications: Scheduled Meds:  (feeding supplement) PROSource Plus  30 mL Oral BID BM   bisacodyl  10 mg Rectal Daily   Chlorhexidine Gluconate Cloth  6 each Topical Q0600   darbepoetin (ARANESP) injection - DIALYSIS  40 mcg Intravenous Q Thu-HD   diltiazem  360 mg Oral Daily   enoxaparin (LOVENOX) injection  30 mg Subcutaneous Q24H   feeding supplement (NEPRO CARB STEADY)  237 mL Oral TID BM   ferric citrate  420 mg Oral TID WC   multivitamin  1 tablet Oral QHS   senna  1 tablet Oral Daily   Continuous Infusions:  cefTRIAXone (ROCEPHIN)  IV 2 g (06/16/21 1820)   ferric gluconate (FERRLECIT) IVPB Stopped (06/16/21 1727)   PRN Meds:.hydrALAZINE, oxyCODONE-acetaminophen, polyethylene glycol    Assessment/Plan: 60 y.o. male with high volume abd free fluid that accumulated following LOA ex lap about a month ago.  E. Coli growing in his abdominal fluid. SAAG is low and no sign of chronic liver disease by imaging or labs; very  unlikely that the fluid is due to any liver disease or portal hypertension.  I recommend asking general surgery to reevaluate him, considering that much data points towards a secondary bacterial peritonitis process.  Will probably be helpful to repeat a diagnostic paracentesis as well; need to send the fluid for g stain, cell count, culture and sensitivity, amylase, cytology, LDH, glucose and total protein. I will leave this to him IM team, will continue to follow.   Milus Banister, MD  06/17/2021, 7:24 AM [facility] Pager [phone]

## 2021-06-18 DIAGNOSIS — F322 Major depressive disorder, single episode, severe without psychotic features: Secondary | ICD-10-CM | POA: Diagnosis not present

## 2021-06-18 DIAGNOSIS — R45851 Suicidal ideations: Secondary | ICD-10-CM

## 2021-06-18 LAB — GRAM STAIN

## 2021-06-18 LAB — CBC
HCT: 26.4 % — ABNORMAL LOW (ref 39.0–52.0)
Hemoglobin: 8.8 g/dL — ABNORMAL LOW (ref 13.0–17.0)
MCH: 28.9 pg (ref 26.0–34.0)
MCHC: 33.3 g/dL (ref 30.0–36.0)
MCV: 86.8 fL (ref 80.0–100.0)
Platelets: 381 10*3/uL (ref 150–400)
RBC: 3.04 MIL/uL — ABNORMAL LOW (ref 4.22–5.81)
RDW: 14.2 % (ref 11.5–15.5)
WBC: 11.5 10*3/uL — ABNORMAL HIGH (ref 4.0–10.5)
nRBC: 0 % (ref 0.0–0.2)

## 2021-06-18 LAB — RENAL FUNCTION PANEL
Albumin: 1.7 g/dL — ABNORMAL LOW (ref 3.5–5.0)
Anion gap: 11 (ref 5–15)
BUN: 63 mg/dL — ABNORMAL HIGH (ref 6–20)
CO2: 26 mmol/L (ref 22–32)
Calcium: 8 mg/dL — ABNORMAL LOW (ref 8.9–10.3)
Chloride: 94 mmol/L — ABNORMAL LOW (ref 98–111)
Creatinine, Ser: 6.12 mg/dL — ABNORMAL HIGH (ref 0.61–1.24)
GFR, Estimated: 10 mL/min — ABNORMAL LOW (ref >60)
Glucose, Bld: 118 mg/dL — ABNORMAL HIGH (ref 70–99)
Phosphorus: 3.6 mg/dL (ref 2.5–4.6)
Potassium: 3.8 mmol/L (ref 3.5–5.1)
Sodium: 131 mmol/L — ABNORMAL LOW (ref 135–145)

## 2021-06-18 LAB — AMYLASE, PLEURAL OR PERITONEAL FLUID: Amylase, Fluid: 71 U/L

## 2021-06-18 MED ORDER — HEPARIN SODIUM (PORCINE) 1000 UNIT/ML DIALYSIS
1000.0000 [IU] | INTRAMUSCULAR | Status: DC | PRN
Start: 2021-06-18 — End: 2021-06-18

## 2021-06-18 MED ORDER — ALTEPLASE 2 MG IJ SOLR: 2.0000 mg | Freq: Once | INTRAMUSCULAR | Status: DC | PRN

## 2021-06-18 MED ORDER — SODIUM CHLORIDE 0.9 % IV SOLN: 100.0000 mL | INTRAVENOUS | Status: DC | PRN

## 2021-06-18 MED ORDER — PANTOPRAZOLE SODIUM 20 MG PO TBEC
20.0000 mg | DELAYED_RELEASE_TABLET | Freq: Every day | ORAL | Status: DC
  Administered 2021-06-18 – 2021-06-25 (×8): 20 mg via ORAL
  Filled 2021-06-18 (×8): qty 1

## 2021-06-18 MED ORDER — ALUM & MAG HYDROXIDE-SIMETH 200-200-20 MG/5ML PO SUSP: 15.0000 mL | Freq: Four times a day (QID) | ORAL | Status: DC | PRN

## 2021-06-18 MED ORDER — BUPROPION HCL ER (XL) 150 MG PO TB24
150.0000 mg | ORAL_TABLET | Freq: Every day | ORAL | Status: DC
  Administered 2021-06-18 – 2021-06-25 (×8): 150 mg via ORAL
  Filled 2021-06-18 (×8): qty 1

## 2021-06-18 MED ORDER — FLEET ENEMA 7-19 GM/118ML RE ENEM
1.0000 | ENEMA | Freq: Once | RECTAL | Status: AC
  Administered 2021-06-18: 1 via RECTAL
  Filled 2021-06-18: qty 1

## 2021-06-18 MED ORDER — LIDOCAINE HCL (PF) 1 % IJ SOLN
5.0000 mL | INTRAMUSCULAR | Status: DC | PRN
Start: 2021-06-18 — End: 2021-06-18

## 2021-06-18 MED ORDER — LIDOCAINE-PRILOCAINE 2.5-2.5 % EX CREA: 1.0000 "application " | TOPICAL_CREAM | CUTANEOUS | Status: DC | PRN

## 2021-06-18 MED ORDER — PENTAFLUOROPROP-TETRAFLUOROETH EX AERO
1.0000 | INHALATION_SPRAY | CUTANEOUS | Status: DC | PRN
Start: 2021-06-18 — End: 2021-06-18
  Administered 2021-06-18: 1 via TOPICAL

## 2021-06-18 NOTE — Consult Note (Signed)
[facility] Consult   Reason for Consult: ''Endorsing suicidal ideology. Stated he was looking for items in hospital room to harm himself'' Patient Identification: Timothy [name] MRN:  [id number] Principal Diagnosis: Major depressive disorder, single episode, severe ([place]) Diagnosis:  Principal Problem:   Major depressive disorder, single episode, severe ([place]) Active Problems:   Ileus ([place])   Protein-calorie malnutrition, severe   Bacterial peritonitis ([place])   Other ascites   Total Time spent with patient: 1 hour  Subjective:   Timothy [name] is a 60 y.o. male patient admitted with urinary retention, generalized abdominal pain and constipation.  HPI:   Patient is a 59 yo Male with PMHx of ESRD on HD-TTS, colon cancer s/p resection, HFpEF, HTN, and polysubstance use who was admitted for ileus and found to have bacterial peritonitis. Patient reports being depressed since he was diagnosed with End stage renal problem but has never sought a psychiatric help until now. He is endorsing worsening depression characterized by hopelessness, worthlessness, lack of energy, poor sleep, anxiety about his future. Patient reports recurrent suicidal thoughts:''I am tired of living like this". He reports being stressed out because his social security monthly check was cut off, tired of dealing with multiple medical issues, lack of finance and may become homeless soon. Today, he denies psychosis, delusions, drugs/alcohol  but unable to contract for safety.  Past Psychiatric History: as above  Risk to Self:  yes Risk to Others:  denies Prior Inpatient Therapy:  none reported by the patient Prior Outpatient Therapy:    Past Medical History:  Past Medical History:  Diagnosis Date   Anemia of chronic kidney failure    BPH (benign prostatic hyperplasia)    Colon cancer ([place]) 2014   End stage renal disease on dialysis ([place]) 2017   Hypertension    Hypertensive heart disease with chronic  diastolic congestive heart failure ([place]) 07/17/2016   Polysubstance abuse ([place])    History of heroin and marijuana use    Past Surgical History:  Procedure Laterality Date   ABDOMINAL SURGERY     AV FISTULA PLACEMENT Left 09/19/2016   Procedure: Left arm Radiocephalic ARTERIOVENOUS (AV) FISTULA CREATION;  Surgeon: Conrad Strongsville, MD;  Location: [facility];  Service: Vascular;  Laterality: Left;   [place] Left 07/09/2017   Procedure: BRACHIOCEPHALIC FISTULA CREATION;  Surgeon: Conrad Coleharbor, MD;  Location: [facility];  Service: Vascular;  Laterality: Left;   COLON SURGERY  2014   INSERTION OF DIALYSIS CATHETER N/A 09/19/2016   Procedure: INSERTION OF TUNNELED DIALYSIS CATHETER;  Surgeon: Conrad Davidson, MD;  Location: [facility];  Service: Vascular;  Laterality: N/A;   IR GENERIC HISTORICAL  09/14/2016   IR US GUIDE VASC ACCESS RIGHT 09/14/2016 [name], DO [facility]   IR GENERIC HISTORICAL  09/14/2016   IR FLUORO GUIDE CV LINE RIGHT 09/14/2016 [name], DO [facility]   LAPAROTOMY N/A 05/23/2021   Procedure: EXPLORATORY LAPAROTOMY LYSIS ADHESIONS;  Surgeon: [name], MD;  Location: [facility];  Service: General;  Laterality: N/A;   ORIF TIBIA PLATEAU Left 01/15/2018   Procedure: OPEN REDUCTION INTERNAL FIXATION (ORIF) TIBIAL PLATEAU;  Surgeon: Altamese Beggs, MD;  Location: [facility];  Service: Orthopedics;  Laterality: Left;   TRANSTHORACIC ECHOCARDIOGRAM  07/2016    EF 60-65%, No RWMA. Mod Concentric LVH - Gr 2 DD. Severe LA dilation. PAP ~35 mmHg (mild Pulm HTN)  --> no changes noted 1 month later   Family History:  Family History  Problem  Relation Age of Onset   Heart failure Mother        Died at age 69.   Heart attack Mother 6   Hypertension Mother    Diabetes Mellitus II Mother    Other Father        Unknown   Kidney failure Sister        (Oldest Sister)   Other Other        Multiple siblings have started her heart disease, he is not sure of the details.   CAD  Nephew    Family Psychiatric  History:  Social History:  Social History   Substance and Sexual Activity  Alcohol Use No   Alcohol/week: 7.0 standard drinks   Types: 7 Cans of beer per week     Social History   Substance and Sexual Activity  Drug Use Yes   Frequency: 1.0 times per week   Types: Marijuana, Heroin   Comment: snorts heroin, takes pain pills that he buys off the street    Social History   Socioeconomic History   Marital status: Married    Spouse name: Not on file   Number of children: Not on file   Years of education: Not on file   Highest education level: Not on file  Occupational History   Not on file  Tobacco Use   Smoking status: Former    Packs/day: 0.25    Types: Cigarettes    Quit date: 07/06/2017    Years since quitting: 3.9   Smokeless tobacco: Never  Vaping Use   Vaping Use: Never used  Substance and Sexual Activity   Alcohol use: No    Alcohol/week: 7.0 standard drinks    Types: 7 Cans of beer per week   Drug use: Yes    Frequency: 1.0 times per week    Types: Marijuana, Heroin    Comment: snorts heroin, takes pain pills that he buys off the street   Sexual activity: Not on file    Comment: once a week  Other Topics Concern   Not on file  Social History Narrative   Not on file   Social Determinants of Health   Financial Resource Strain: Not on file  Food Insecurity: Not on file  Transportation Needs: Not on file  Physical Activity: Not on file  Stress: Not on file  Social Connections: Not on file   Additional Social History:    Allergies:   Allergies  Allergen Reactions   No Known Allergies     Labs:  Results for orders placed or performed during the hospital encounter of 06/11/21 (from the past 48 hour(s))  Body fluid cell count with differential     Status: Abnormal   Collection Time: 06/17/21  2:45 PM  Result Value Ref Range   Fluid Type-FCT Peritoneal    Color, Fluid STRAW (A) YELLOW   Appearance, Fluid HAZY (A)  CLEAR   Total Nucleated Cell Count, Fluid 1,221 (H) 0 - 1,000 cu mm   Neutrophil Count, Fluid 38 (H) 0 - 25 %   Lymphs, Fluid 53 %   Monocyte-Macrophage-Serous Fluid 9 (L) 50 - 90 %   Eos, Fluid 0 %   Other Cells, Fluid MESOTHELIAL CELLS %    Comment: Performed at [facility] [address] [place], [place] [address]  Amylase, pleural or peritoneal fluid        Status: None   Collection Time: 06/17/21  2:45 PM  Result Value Ref Range  Amylase, Fluid 71 U/L    Comment: NO NORMAL RANGE ESTABLISHED FOR THIS TEST Performed at [facility], [address] [place], [place] [address]    Fluid Type-FAMY Peritoneal     Comment: Performed at [facility], [place] [address] [place], [place] [address]  Lactate dehydrogenase (pleural or peritoneal fluid)     Status: Abnormal   Collection Time: 06/17/21  2:45 PM  Result Value Ref Range   LD, Fluid 107 (H) 3 - 23 U/L    Comment: (NOTE) Results should be evaluated in conjunction with serum values    Fluid Type-FLDH PERITONEAL CAVITY     Comment: Performed at [facility] [address] [place], [place] [address]  Glucose, pleural or peritoneal fluid     Status: None   Collection Time: 06/17/21  2:45 PM  Result Value Ref Range   Glucose, Fluid 95 mg/dL    Comment: (NOTE) No normal range established for this test Results should be evaluated in conjunction with serum values    Fluid Type-FGLU PERITONEAL CAVITY     Comment: Performed at [facility] [address] [place], [place] [address]  Protein, pleural or peritoneal fluid     Status: None   Collection Time: 06/17/21  2:45 PM  Result Value Ref Range   Total protein, fluid 3.8 g/dL    Comment: (NOTE) No normal range established for this test Results should be evaluated in conjunction with serum values    Fluid Type-FTP Peritoneal     Comment: Performed at [facility], [place] [address], [place], [place] [address]  Albumin, pleural or  peritoneal fluid      Status: None   Collection Time: 06/17/21  2:45 PM  Result Value Ref Range   Albumin, Fluid 1.4 g/dL    Comment: (NOTE) No normal range established for this test Results should be evaluated in conjunction with serum values    Fluid Type-FALB Peritoneal     Comment: Performed at [facility], [place] [address], [place], [place] [address]  Culture, body fluid w Gram Stain-bottle     Status: None (Preliminary result)   Collection Time: 06/17/21  2:45 PM   Specimen: Fluid  Result Value Ref Range   Specimen Description FLUID PERITONEAL    Special Requests BOTTLES DRAWN AEROBIC AND ANAEROBIC    Culture      NO GROWTH < 12 HOURS Performed at [facility], [place] [address], [place],  [address]    Report Status PENDING   Gram stain     Status: None   Collection Time: 06/17/21  2:45 PM   Specimen: Fluid  Result Value Ref Range   Specimen Description FLUID PERITONEAL    Special Requests NONE    Gram Stain      ABUNDANT WBC PRESENT, PREDOMINANTLY MONONUCLEAR NO ORGANISMS SEEN Performed at [facility], [place] [address] [place],  [address]    Report Status 06/18/2021 FINAL   Protein, total     Status: Abnormal   Collection Time: 06/17/21  5:38 PM  Result Value Ref Range   Total Protein 5.6 (L) 6.5 - 8.1 g/dL    Comment: Performed at [facility], [place] [address] [place],  [address]  Renal function panel     Status: Abnormal   Collection Time: 06/18/21  7:39 AM  Result Value Ref Range   Sodium 131 (L) 135 - 145 mmol/L  Potassium 3.8 3.5 - 5.1 mmol/L   Chloride 94 (L) 98 - 111 mmol/L   CO2 26 22 - 32 mmol/L   Glucose, Bld 118 (H) 70 - 99 mg/dL    Comment: Glucose reference range applies only to samples taken after fasting for at least 8 hours.   BUN 63 (H) 6 - 20 mg/dL   Creatinine, Ser 6.12 (H) 0.61 - 1.24 mg/dL   Calcium 8.0 (L) 8.9 - 10.3 mg/dL   Phosphorus 3.6 2.5 - 4.6 mg/dL   Albumin 1.7 (L) 3.5 - 5.0 g/dL   GFR,  Estimated 10 (L) >60 mL/min    Comment: (NOTE) Calculated using the CKD-EPI Creatinine Equation (2021)    Anion gap 11 5 - 15    Comment: Performed at [facility] [address], [place], [place] [address]  CBC     Status: Abnormal   Collection Time: 06/18/21  7:39 AM  Result Value Ref Range   WBC 11.5 (H) 4.0 - 10.5 K/uL   RBC 3.04 (L) 4.22 - 5.81 MIL/uL   Hemoglobin 8.8 (L) 13.0 - 17.0 g/dL   HCT 26.4 (L) 39.0 - 52.0 %   MCV 86.8 80.0 - 100.0 fL   MCH 28.9 26.0 - 34.0 pg   MCHC 33.3 30.0 - 36.0 g/dL   RDW 14.2 11.5 - 15.5 %   Platelets 381 150 - 400 K/uL   nRBC 0.0 0.0 - 0.2 %    Comment: Performed at [facility], [place] [address], [place], [place] [address]    Current Facility-Administered Medications  Medication Dose Route Frequency Provider Last Rate Last Admin   (feeding supplement) PROSource Plus liquid 30 mL  30 mL Oral BID BM Katsadouros, Vasilios, MD   30 mL at 06/15/21 0948   bisacodyl (DULCOLAX) suppository 10 mg  10 mg Rectal Daily Jesusita Oka, MD   10 mg at 06/17/21 1148   buPROPion (WELLBUTRIN XL) 24 hr tablet 150 mg  150 mg Oral Daily Janace Decker, MD       cefTRIAXone (ROCEPHIN) 2 g in sodium chloride 0.9 % 100 mL IVPB  2 g Intravenous Q24H Zehr, Jessica D, PA-C 200 mL/hr at 06/17/21 2215 2 g at 06/17/21 2215   Chlorhexidine Gluconate Cloth 2 % PADS 6 each  6 each Topical Q0600 Valentina Gu, NP   6 each at 06/17/21 1100   Darbepoetin Alfa (ARANESP) injection 40 mcg  40 mcg Intravenous Q Thu-HD Ernest Haber, PA-C   40 mcg at 06/16/21 1540   diltiazem (CARDIZEM CD) 24 hr capsule 360 mg  360 mg Oral Daily Virl Axe, MD   360 mg at 06/18/21 1008   enoxaparin (LOVENOX) injection 30 mg  30 mg Subcutaneous Q24H Katsadouros, Vasilios, MD   30 mg at 06/18/21 1008   feeding supplement (NEPRO CARB STEADY) liquid 237 mL  237 mL Oral TID BM Ernest Haber, PA-C   237 mL at 06/18/21 1008   ferric citrate (AURYXIA) tablet 420 mg  420 mg Oral TID  WC Ernest Haber, PA-C   420 mg at 06/17/21 1643   hydrALAZINE (APRESOLINE) injection 10 mg  10 mg Intravenous Q4H PRN Katsadouros, Vasilios, MD   10 mg at 06/16/21 2122   hydrALAZINE (APRESOLINE) tablet 25 mg  25 mg Oral Daily Rick Duff, MD   25 mg at 06/18/21 1008   HYDROmorphone (DILAUDID) injection 0.5 mg  0.5 mg Intravenous Q3H PRN Rehman, Areeg N, DO   0.5 mg at  06/18/21 1104   multivitamin (RENA-VIT) tablet 1 tablet  1 tablet Oral QHS Katsadouros, Vasilios, MD   1 tablet at 06/17/21 2217   ondansetron (ZOFRAN) injection 4 mg  4 mg Intravenous Once Rick Duff, MD       oxyCODONE-acetaminophen (PERCOCET/ROXICET) 5-325 MG per tablet 1 tablet  1 tablet Oral Q6H PRN Rick Duff, MD   1 tablet at 06/18/21 1009   polyethylene glycol (MIRALAX / GLYCOLAX) packet 17 g  17 g Oral Daily PRN Rick Duff, MD       senna Donavan Burnet) tablet 8.6 mg  1 tablet Oral Daily Rick Duff, MD   8.6 mg at 06/18/21 1009    Musculoskeletal: Strength & Muscle Tone: within normal limits Gait & Station: normal Patient leans: N/A      Psychiatric Specialty Exam:  Presentation  General Appearance: Fairly Groomed  Eye Contact:Fair  Speech:Clear and Coherent  Speech Volume:Decreased  Handedness:Right   Mood and Affect  Mood:Depressed; Hopeless  Affect:Constricted   Thought Process  Thought Processes:Coherent; Linear  Descriptions of Associations:Intact  Orientation:Full (Time, Place and Person)  Thought Content:Logical  History of Schizophrenia/Schizoaffective disorder:No data recorded Duration of Psychotic Symptoms:No data recorded Hallucinations:Hallucinations: None  Ideas of Reference:None  Suicidal Thoughts:Suicidal Thoughts: Yes, Active SI Active Intent and/or Plan: Without Plan  Homicidal Thoughts:Homicidal Thoughts: No   Sensorium  Memory:Immediate Good; Recent Good; Remote Good  Judgment:Fair  Insight:Fair   Executive Functions   Concentration:Fair  Attention Span:Fair  [place]  Language:Good   Psychomotor Activity  Psychomotor Activity:Psychomotor Activity: Decreased; Psychomotor Retardation   Assets  Assets:Communication Skills; Desire for Improvement   Sleep  Sleep:Sleep: Fair   Physical Exam: Physical Exam ROS Blood pressure (!) 152/83, pulse 85, temperature 97.9 F (36.6 C), temperature source Oral, resp. rate 18, height 6\' 1"  (1.854 m), weight 66.5 kg, SpO2 100 %. Body mass index is 19.34 kg/m.  Treatment Plan Summary: 60 year old male with multiple medical issues who reports worsening depression, suicidal thoughts since he was cut off by social security and now on the verge of being homeless. Patient is unable to contract for safety, he will benefit from inpatient psychiatric admission after he is medically stabilized.  Recommendations: -Continue 1:1 sitter for safety -Consider adding Wellbutrin XL 150 mg daily for depression -Consider social worker consult to evaluate patient's social need and to facilitate referral for psychiatric inpatient    Disposition: Patient does not meet criteria for psychiatric inpatient admission. Supportive therapy provided about ongoing stressors. Psychiatric consult service will follow patient as needed  Corena Pilgrim, MD 06/18/2021 11:59 AM

## 2021-06-18 NOTE — Progress Notes (Signed)
Subjective/Chief Complaint: Unhappy today about not eating, just got back from hd, not sure about flatus, there was stool in bathroom   Objective: Vital signs in last 24 hours: Temp:  [97.9 F (36.6 C)-99.1 F (37.3 C)] 98.6 F (37 C) (09/03 1228) Pulse Rate:  [79-93] 90 (09/03 1228) Resp:  [15-20] 15 (09/03 1228) BP: (140-180)/(76-102) 172/91 (09/03 1228) SpO2:  [93 %-100 %] 96 % (09/03 1228) Weight:  [66.5 kg] 66.5 kg (09/03 0900) Last BM Date: 06/17/21  Intake/Output from previous day: 09/02 0701 - 09/03 0700 In: 932 [P.O.:932] Out: -  Intake/Output this shift: Total I/O In: 240 [P.O.:240] Out: 98 [Other:98]  Ab mild distended wound healed, nontender   Lab Results:  Recent Labs    06/18/21 0739  WBC 11.5*  HGB 8.8*  HCT 26.4*  PLT 381   BMET Recent Labs    06/18/21 0739  NA 131*  K 3.8  CL 94*  CO2 26  GLUCOSE 118*  BUN 63*  CREATININE 6.12*  CALCIUM 8.0*   PT/INR No results for input(s): LABPROT, INR in the last 72 hours. ABG No results for input(s): PHART, HCO3 in the last 72 hours.  Invalid input(s): PCO2, PO2  Studies/Results: DG Abd 1 View  Result Date: 06/16/2021 CLINICAL DATA:  Abdominal pain. EXAM: ABDOMEN - 1 VIEW COMPARISON:  CT of the abdomen pelvis dated 06/11/2021. FINDINGS: Several normal caliber air-filled loops of small bowel in the right hemiabdomen. No bowel dilatation or evidence of obstruction. Moderate stool throughout the colon. No free air. Multiple surgical clips. Atherosclerotic calcification of the aorta. No acute osseous pathology. IMPRESSION: Nonobstructive bowel gas pattern. Electronically Signed   By: Anner Crete M.D.   On: 06/16/2021 21:22   CT CHEST W CONTRAST  Result Date: 06/17/2021 CLINICAL DATA:  Pneumonia, effusion or abscess suspected, xray done; Abdominal pain, fever Bibasilar disease on CT abdomen and pelvis. New ascites with concern for metastatic cancer with new renal mass. Laparotomy 1 month ago  per electronic medical records. EXAM: CT CHEST, ABDOMEN, AND PELVIS WITH CONTRAST TECHNIQUE: Multidetector CT imaging of the chest, abdomen and pelvis was performed following the standard protocol during bolus administration of intravenous contrast. CONTRAST:  168m OMNIPAQUE IOHEXOL 350 MG/ML SOLN COMPARISON:  Abdominal CT 6 days ago 06/11/2021. Most recent chest imaging radiograph from December of 2021. FINDINGS: CT CHEST FINDINGS Cardiovascular: Aortic atherosclerosis. No aneurysm or dissection. No central pulmonary embolus. Mild cardiomegaly with coronary artery calcifications. No pericardial effusion. Mediastinum/Nodes: No enlarged mediastinal or hilar lymph nodes. No esophageal wall thickening. No thyroid nodule. Lungs/Pleura: Bandlike opacities throughout the lower lobes. This is slightly more confluent on the right where there are central air bronchograms. Slightly rounded subpleural appearance on the left suggestive of round atelectasis. There is a small left pleural effusion. Occasional areas of perifissural and subpleural ground-glass opacity in the upper lobes. There is no dominant pulmonary mass. No pulmonary nodule. Trachea and central bronchi are patent. Musculoskeletal: Slight ground-glass density of the osseous structures suggesting renal osteodystrophy. No focal bone lesion or acute osseous abnormality. CT ABDOMEN PELVIS FINDINGS Hepatobiliary: Focal hepatic lesion. Slight decreased hepatic density typical of steatosis. Thick-walled gallbladder is nonspecific in the setting of ascites. Punctate gallstone, assessed with recent ultrasound. No common bile duct dilatation. Pancreas: 5 mm pancreatic ductal dilatation. This is unchanged from prior exam. No evidence of pancreatic mass. Spleen: Subtle subcentimeter low-density in the inferior spleen. Additional areas of decreased density on prior exam are no longer seen. Cyst in the  superior spleen measures 18 mm, unchanged. Adrenals/Urinary Tract: No  adrenal nodule. Sequela of chronic renal disease with renal parenchymal atrophy and multiple cysts. There is a 3.1 Cm more solid-appearing lesion in the anterior right kidney. No hydronephrosis. Absent renal excretion on delayed phase imaging consistent with chronic kidney disease. Urinary bladder is nondistended. Stomach/Bowel: Ingested material and pills in the stomach. Ingested material reaches the distal small bowel and proximal colon. Slightly edematous small bowel appearance of left abdominal bowel loops may be related to adjacent ascites. Enteric sutures in the right abdomen, suspected prior right hepatic colectomy. No small bowel obstruction. Small to moderate colonic stool burden. Vascular/Lymphatic: Aortic atherosclerosis and tortuosity. Patent portal vein. No perirenal or retroperitoneal adenopathy. Abdominal or pelvic adenopathy. Reproductive: Unremarkable prostate. The lower most field-of-view series 3, image 141 there is air adjacent to the peroneal soft tissues, it is unclear if this represents soft tissue air or simply air within adjacent soft tissue structures. Other: Moderate volume abdominopelvic ascites. No convincing omental thickening. Generalized edema of the subcutaneous tissues. No free intra-abdominal air. Postsurgical change of the abdominal wall. Musculoskeletal: Slight ground-glass density of the osseous structures suggesting renal osteodystrophy. No focal bone lesion. IMPRESSION: 1. Bandlike opacities throughout the lower lobes, slightly more confluent on the right where there are central air bronchograms. Findings may represent atelectasis or infection. There is a small left pleural effusion. 2. Occasional areas of perifissural and subpleural ground-glass opacity in the upper lobes, may be infectious or inflammatory. 3. No pulmonary nodule, mass, or evidence of intrathoracic malignancy. 4. Indeterminate solid-appearing lesion in the right mid kidney measuring approximately 3.1 cm, not  significantly changed from recent exam, remaining suspicious for neoplasm. No adenopathy. 5. 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 Subtle subcentimeter low-density in the inferior spleen, nonspecific. Additional areas of decreased density on prior exam are no longer seen. This may represent splenic infarcts sequela, less likely metastatic disease given findings have improved from recent prior exam. 6. Moderate volume abdominopelvic ascites. Generalized edema of the subcutaneous tissues. 7. Thick-walled gallbladder is nonspecific in the setting of ascites. Punctate gallstone, assessed with recent ultrasound. 8. Stable pancreatic ductal dilatation. 9. The lower most field-of-view there is air adjacent to the perineal soft tissues, it is unclear if this represents soft tissue air or simply air within adjacent soft tissue structures. Recommend correlation with physical exam for symptoms of crepitus or soft tissue infection. Air coursing adjacent to soft tissue structures is favored on reformats. Aortic Atherosclerosis (ICD10-I70.0). Electronically Signed   By: Keith Rake M.D.   On: 06/17/2021 22:24   CT ABDOMEN PELVIS W CONTRAST  Result Date: 06/17/2021 CLINICAL DATA:  Pneumonia, effusion or abscess suspected, xray done; Abdominal pain, fever Bibasilar disease on CT abdomen and pelvis. New ascites with concern for metastatic cancer with new renal mass. Laparotomy 1 month ago per electronic medical records. EXAM: CT CHEST, ABDOMEN, AND PELVIS WITH CONTRAST TECHNIQUE: Multidetector CT imaging of the chest, abdomen and pelvis was performed following the standard protocol during bolus administration of intravenous contrast. CONTRAST:  166m OMNIPAQUE IOHEXOL 350 MG/ML SOLN COMPARISON:  Abdominal CT 6 days ago 06/11/2021. Most recent chest imaging radiograph from December of 2021. FINDINGS: CT CHEST FINDINGS Cardiovascular: Aortic atherosclerosis. No aneurysm or dissection. No central pulmonary embolus. Mild cardiomegaly with  coronary artery calcifications. No pericardial effusion. Mediastinum/Nodes: No enlarged mediastinal or hilar lymph nodes. No esophageal wall thickening. No thyroid nodule. Lungs/Pleura: Bandlike opacities throughout the lower lobes. This is slightly more confluent on the right where there  are central air bronchograms. Slightly rounded subpleural appearance on the left suggestive of round atelectasis. There is a small left pleural effusion. Occasional areas of perifissural and subpleural ground-glass opacity in the upper lobes. There is no dominant pulmonary mass. No pulmonary nodule. Trachea and central bronchi are patent. Musculoskeletal: Slight ground-glass density of the osseous structures suggesting renal osteodystrophy. No focal bone lesion or acute osseous abnormality. CT ABDOMEN PELVIS FINDINGS Hepatobiliary: Focal hepatic lesion. Slight decreased hepatic density typical of steatosis. Thick-walled gallbladder is nonspecific in the setting of ascites. Punctate gallstone, assessed with recent ultrasound. No common bile duct dilatation. Pancreas: 5 mm pancreatic ductal dilatation. This is unchanged from prior exam. No evidence of pancreatic mass. Spleen: Subtle subcentimeter low-density in the inferior spleen. Additional areas of decreased density on prior exam are no longer seen. Cyst in the superior spleen measures 18 mm, unchanged. Adrenals/Urinary Tract: No adrenal nodule. Sequela of chronic renal disease with renal parenchymal atrophy and multiple cysts. There is a 3.1 Cm more solid-appearing lesion in the anterior right kidney. No hydronephrosis. Absent renal excretion on delayed phase imaging consistent with chronic kidney disease. Urinary bladder is nondistended. Stomach/Bowel: Ingested material and pills in the stomach. Ingested material reaches the distal small bowel and proximal colon. Slightly edematous small bowel appearance of left abdominal bowel loops may be related to adjacent ascites. Enteric  sutures in the right abdomen, suspected prior right hepatic colectomy. No small bowel obstruction. Small to moderate colonic stool burden. Vascular/Lymphatic: Aortic atherosclerosis and tortuosity. Patent portal vein. No perirenal or retroperitoneal adenopathy. Abdominal or pelvic adenopathy. Reproductive: Unremarkable prostate. The lower most field-of-view series 3, image 141 there is air adjacent to the peroneal soft tissues, it is unclear if this represents soft tissue air or simply air within adjacent soft tissue structures. Other: Moderate volume abdominopelvic ascites. No convincing omental thickening. Generalized edema of the subcutaneous tissues. No free intra-abdominal air. Postsurgical change of the abdominal wall. Musculoskeletal: Slight ground-glass density of the osseous structures suggesting renal osteodystrophy. No focal bone lesion. IMPRESSION: 1. Bandlike opacities throughout the lower lobes, slightly more confluent on the right where there are central air bronchograms. Findings may represent atelectasis or infection. There is a small left pleural effusion. 2. Occasional areas of perifissural and subpleural ground-glass opacity in the upper lobes, may be infectious or inflammatory. 3. No pulmonary nodule, mass, or evidence of intrathoracic malignancy. 4. Indeterminate solid-appearing lesion in the right mid kidney measuring approximately 3.1 cm, not significantly changed from recent exam, remaining suspicious for neoplasm. No adenopathy. 5. 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 Subtle subcentimeter low-density in the inferior spleen, nonspecific. Additional areas of decreased density on prior exam are no longer seen. This may represent splenic infarcts sequela, less likely metastatic disease given findings have improved from recent prior exam. 6. Moderate volume abdominopelvic ascites. Generalized edema of the subcutaneous tissues. 7. Thick-walled gallbladder is nonspecific in the setting of ascites. Punctate  gallstone, assessed with recent ultrasound. 8. Stable pancreatic ductal dilatation. 9. The lower most field-of-view there is air adjacent to the perineal soft tissues, it is unclear if this represents soft tissue air or simply air within adjacent soft tissue structures. Recommend correlation with physical exam for symptoms of crepitus or soft tissue infection. Air coursing adjacent to soft tissue structures is favored on reformats. Aortic Atherosclerosis (ICD10-I70.0). Electronically Signed   By: Keith Rake M.D.   On: 06/17/2021 22:24    Anti-infectives: Anti-infectives (From admission, onward)    Start     Dose/Rate Route Frequency  Ordered Stop   06/16/21 2000  cefoTAXime (CLAFORAN) 2 g in dextrose 5 % 50 mL IVPB  Status:  Discontinued        2 g 140 mL/hr over 30 Minutes Intravenous Every 24 hours 06/15/21 1344 06/16/21 0858   06/16/21 1700  cefTRIAXone (ROCEPHIN) 2 g in sodium chloride 0.9 % 100 mL IVPB        2 g 200 mL/hr over 30 Minutes Intravenous Every 24 hours 06/16/21 0858     06/15/21 1430  cefoTAXime (CLAFORAN) 2 g in dextrose 5 % 50 mL IVPB        2 g 140 mL/hr over 30 Minutes Intravenous  Once 06/15/21 1344 06/15/21 1749       Assessment/Plan: Hx exploratory laparotomy/ adhesiolysis 05/23/21 for small bowel obstruction - Dr. Donne Hazel S/p previous right hemicolectomy for colon cancer - No radiographic or clinical evidence of sbo, he can have diet advanced from my standpoint -dont think any role for any surgery   Large volume ascites - s/p paracentesis 8/29. Paracentesis fluid with E. Coli and Cytology with mesothelial cells. Unclear etiology. GI recommending repeat diagnostic paracentesis and to send the fluid for g stain, cell count, culture and sensitivity, amylase, cytology, LDH, glucose and total protein. Defer to IM team.    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

## 2021-06-18 NOTE — Progress Notes (Signed)
Occupational Therapy Treatment Patient Details Name: Timothy [name] MRN: [id number] DOB: 02-Jan-1961 Today's Date: 06/18/2021    History of present illness Pt is a 60 yo male who presented to [facility] on 06/11/21 with complaints of abdominal pain, constipation, and swelling. Work up indicating Ileus.  Pt with large volume ascites s/p paracentesis 8/29, fluid with E coli and cystology with mesothelial cells. Other dx include colelithiasis. 9/3, psychiatry following pt for active SI.  PMH: ESRD with HD on TTS, hx of colon cancer, CHF, polysubstance abuse, exploratory laparotomy/ adhesiolysis 05/23/21 for small bowel obstruction.   OT comments  Session limited to bed mobility due to patient refusal for further mobility. Pt reports increased bloating and discomfort, requiring min A to complete rolling and long sitting in bed. Once sitting, pt continues to do well with seated static and dynamic balance. OT will continue to follow up to encourage participation and progression of mobility.    Follow Up Recommendations  No OT follow up;Supervision - Intermittent    Equipment Recommendations  Other (comment) (RW)    Recommendations for Other Services      Precautions / Restrictions Precautions Precautions: Fall Precaution Comments: very unsteady on his feet Restrictions Weight Bearing Restrictions: No       Mobility Bed Mobility Overal bed mobility: Needs Assistance Bed Mobility: Rolling;Supine to Sit Rolling: Min assist   Supine to sit: Min assist     General bed mobility comments: Requiring min A due to pain/discomfort in abdomen, deferring all mobility past long sit in bed.    Transfers                 General transfer comment: Deferred due to abdominal discomfort    Balance Overall balance assessment: Needs assistance Sitting-balance support: Feet supported;No upper extremity supported Sitting balance-Leahy Scale: Good Sitting balance - Comments: Remainined in midline while  long sitting in bed.                                   ADL either performed or assessed with clinical judgement   ADL Overall ADL's : Needs assistance/impaired                                       General ADL Comments: Limited to bed mobility     Vision       Perception     Praxis      Cognition Arousal/Alertness: Awake/alert Behavior During Therapy: Flat affect Overall Cognitive Status: No family/caregiver present to determine baseline cognitive functioning                                 General Comments: Pt reporting that he does not want to live any more, that there is no reason for him to be alive like this. Sitter in room. RN aware of continued SI.        Exercises     Shoulder Instructions       General Comments Pt continuing to focus on wanting more pain medication and reporting that he wants to kill himself.    Pertinent Vitals/ Pain       Pain Assessment: 0-10 Pain Score: 6  Pain Location: abdomen Pain Descriptors / Indicators: Discomfort Pain Intervention(s): Monitored during session;Repositioned;Patient requesting pain meds-RN notified  Home Living                                          Prior Functioning/Environment              Frequency  Min 2X/week        Progress Toward Goals  OT Goals(current goals can now be found in the care plan section)  Progress towards OT goals: Not progressing toward goals - comment  Acute Rehab OT Goals Patient Stated Goal: To not feel like this anymore OT Goal Formulation: With patient Time For Goal Achievement: 06/29/21 Potential to Achieve Goals: Good ADL Goals Pt Will Perform Grooming: with modified independence;standing Pt Will Perform Lower Body Bathing: with modified independence;sitting/lateral leans;sit to/from stand Pt Will Perform Lower Body Dressing: with modified independence;sitting/lateral leans;sit to/from stand Pt Will  Transfer to Toilet: with modified independence;ambulating Pt Will Perform Toileting - Clothing Manipulation and hygiene: with modified independence;sitting/lateral leans;sit to/from stand Additional ADL Goal #1: Pt will tolerate OOB tasks for 8 mins to improve activity tolerance  Plan Discharge plan remains appropriate;Frequency remains appropriate    Co-evaluation                 AM-PAC OT "6 Clicks" Daily Activity     Outcome Measure   Help from another person eating meals?: None Help from another person taking care of personal grooming?: A Little Help from another person toileting, which includes using toliet, bedpan, or urinal?: A Little Help from another person bathing (including washing, rinsing, drying)?: A Little Help from another person to put on and taking off regular upper body clothing?: A Little Help from another person to put on and taking off regular lower body clothing?: A Little 6 Click Score: 19    End of Session    OT Visit Diagnosis: Unsteadiness on feet (R26.81);Other abnormalities of gait and mobility (R26.89);Muscle weakness (generalized) (M62.81)   Activity Tolerance Patient limited by pain   Patient Left in bed;with call bell/phone within reach;with nursing/sitter in room   Nurse Communication Mobility status;Patient requests pain meds        Time: 3254-9826 OT Time Calculation (min): 11 min  Charges: OT General Charges $OT Visit: 1 Visit OT Treatments $Self Care/Home Management : 8-22 mins  Tehani Mersman H., OTR/L Acute Rehabilitation  Arrielle Mcginn Elane Yolanda Bonine 06/18/2021, 8:55 PM

## 2021-06-18 NOTE — Progress Notes (Signed)
Pt off unit to dialysis

## 2021-06-18 NOTE — Consult Note (Signed)
Urology Consult   Physician requesting consult: Ralph Leyden, MD  Reason for consult: Right renal mass  History of Present Illness: Timothy [name] is a 60 y.o. male with multiple medical comorbidities and an incidentally identified indeterminate right renal mass with features concerning for solid neoplasm.  Currently being treated for ileus.  History of ESRD and currently on hemodialysis.  He has a complex surgical history he is status post colectomy in 2014 as well as ex lap on 05/23/2021 with lysis of adhesions.  No personal/family history of GU malignancies.  He denies any recent episodes of gross hematuria.  He previously smoked approximately half a pack of cigarettes for several years, but states that he quit approximately 3 years ago.  He currently smokes marijuana almost on a daily basis and does heroin periodically.  Past Medical History:  Diagnosis Date   Anemia of chronic kidney failure    BPH (benign prostatic hyperplasia)    Colon cancer ([place]) 2014   End stage renal disease on dialysis ([place]) 2017   Hypertension    Hypertensive heart disease with chronic diastolic congestive heart failure ([place]) 07/17/2016   Polysubstance abuse ([place])    History of heroin and marijuana use    Past Surgical History:  Procedure Laterality Date   ABDOMINAL SURGERY     AV FISTULA PLACEMENT Left 09/19/2016   Procedure: Left arm Radiocephalic ARTERIOVENOUS (AV) FISTULA CREATION;  Surgeon: Conrad La Salle, MD;  Location: [facility];  Service: Vascular;  Laterality: Left;   BASCILIC VEIN TRANSPOSITION Left 07/09/2017   Procedure: BRACHIOCEPHALIC FISTULA CREATION;  Surgeon: Conrad Hudspeth, MD;  Location: [facility];  Service: Vascular;  Laterality: Left;   COLON SURGERY  2014   INSERTION OF DIALYSIS CATHETER N/A 09/19/2016   Procedure: INSERTION OF TUNNELED DIALYSIS CATHETER;  Surgeon: Conrad Linden, MD;  Location: [facility];  Service: Vascular;  Laterality: N/A;   IR GENERIC HISTORICAL  09/14/2016   IR US GUIDE VASC  ACCESS RIGHT 09/14/2016 [name], DO [facility]   IR GENERIC HISTORICAL  09/14/2016   IR FLUORO GUIDE CV LINE RIGHT 09/14/2016 [name], DO [facility]   LAPAROTOMY N/A 05/23/2021   Procedure: EXPLORATORY LAPAROTOMY LYSIS ADHESIONS;  Surgeon: [name], MD;  Location: [facility];  Service: General;  Laterality: N/A;   ORIF TIBIA PLATEAU Left 01/15/2018   Procedure: OPEN REDUCTION INTERNAL FIXATION (ORIF) TIBIAL PLATEAU;  Surgeon: Altamese Vesta, MD;  Location: [facility];  Service: Orthopedics;  Laterality: Left;   TRANSTHORACIC ECHOCARDIOGRAM  07/2016    EF 60-65%, No RWMA. Mod Concentric LVH - Gr 2 DD. Severe LA dilation. PAP ~35 mmHg (mild Pulm HTN)  --> no changes noted 1 month later    Current Hospital Medications:  Home Meds:  Current Meds  Medication Sig   diltiazem (CARDIZEM CD) 360 MG 24 hr capsule Take 360 mg by mouth daily.   lidocaine-prilocaine (EMLA) cream Apply 1 application topically See admin instructions. Tuesday,thursday,Saturday prior to dialysis   sevelamer carbonate (RENVELA) 800 MG tablet Take 2 tablets by mouth with breakfast, with lunch, and with evening meal.    Scheduled Meds:  (feeding supplement) PROSource Plus  30 mL Oral BID BM   bisacodyl  10 mg Rectal Daily   Chlorhexidine Gluconate Cloth  6 each Topical Q0600   darbepoetin (ARANESP) injection - DIALYSIS  40 mcg Intravenous Q Thu-HD   diltiazem  360 mg Oral Daily   enoxaparin (LOVENOX) injection  30 mg Subcutaneous Q24H   feeding supplement (NEPRO  CARB STEADY)  237 mL Oral TID BM   ferric citrate  420 mg Oral TID WC   hydrALAZINE  25 mg Oral Daily   multivitamin  1 tablet Oral QHS   ondansetron (ZOFRAN) IV  4 mg Intravenous Once   senna  1 tablet Oral Daily   Continuous Infusions:  cefTRIAXone (ROCEPHIN)  IV 2 g (06/17/21 2215)   PRN Meds:.hydrALAZINE, HYDROmorphone (DILAUDID) injection, oxyCODONE-acetaminophen, polyethylene glycol  Allergies:  Allergies  Allergen Reactions   No  Known Allergies     Family History  Problem Relation Age of Onset   Heart failure Mother        Died at age 33.   Heart attack Mother 42   Hypertension Mother    Diabetes Mellitus II Mother    Other Father        Unknown   Kidney failure Sister        (Oldest Sister)   Other Other        Multiple siblings have started her heart disease, he is not sure of the details.   CAD Nephew     Social History:  reports that he quit smoking about 3 years ago. His smoking use included cigarettes. He smoked an average of .25 packs per day. He has never used smokeless tobacco. He reports current drug use. Frequency: 1.00 time per week. Drugs: Marijuana and Heroin. He reports that he does not drink alcohol.  ROS: A complete review of systems was performed.  All systems are negative except for pertinent findings as noted.  Physical Exam:  Vital signs in last 24 hours: Temp:  [97.9 F (36.6 C)-99.1 F (37.3 C)] 97.9 F (36.6 C) (09/03 0715) Pulse Rate:  [79-93] 85 (09/03 0900) Resp:  [15-20] 18 (09/03 0715) BP: (140-180)/(76-102) 152/83 (09/03 0900) SpO2:  [93 %-100 %] 100 % (09/03 0900) Weight:  [66.5 kg] 66.5 kg (09/03 0900) Constitutional:  Alert and oriented, No acute distress Cardiovascular: Regular rate and rhythm, No JVD Respiratory: Normal respiratory effort, Lungs clear bilaterally GI: Abdomen is soft, nontender, nondistended, no abdominal masses GU: No CVA tenderness Lymphatic: No lymphadenopathy Neurologic: Grossly intact, no focal deficits Psychiatric: Normal mood and affect  Laboratory Data:  Recent Labs    06/18/21 0739  WBC 11.5*  HGB 8.8*  HCT 26.4*  PLT 381    Recent Labs    06/18/21 0739  NA 131*  K 3.8  CL 94*  GLUCOSE 118*  BUN 63*  CALCIUM 8.0*  CREATININE 6.12*     Results for orders placed or performed during the hospital encounter of 06/11/21 (from the past 24 hour(s))  Body fluid cell count with differential     Status: Abnormal   Collection  Time: 06/17/21  2:45 PM  Result Value Ref Range   Fluid Type-FCT Peritoneal    Color, Fluid STRAW (A) YELLOW   Appearance, Fluid HAZY (A) CLEAR   Total Nucleated Cell Count, Fluid 1,221 (H) 0 - 1,000 cu mm   Neutrophil Count, Fluid 38 (H) 0 - 25 %   Lymphs, Fluid 53 %   Monocyte-Macrophage-Serous Fluid 9 (L) 50 - 90 %   Eos, Fluid 0 %   Other Cells, Fluid MESOTHELIAL CELLS %  Amylase, pleural or peritoneal fluid        Status: None   Collection Time: 06/17/21  2:45 PM  Result Value Ref Range   Amylase, Fluid 71 U/L   Fluid Type-FAMY Peritoneal   Lactate dehydrogenase (pleural or peritoneal fluid)  Status: Abnormal   Collection Time: 06/17/21  2:45 PM  Result Value Ref Range   LD, Fluid 107 (H) 3 - 23 U/L   Fluid Type-FLDH PERITONEAL CAVITY   Glucose, pleural or peritoneal fluid     Status: None   Collection Time: 06/17/21  2:45 PM  Result Value Ref Range   Glucose, Fluid 95 mg/dL   Fluid Type-FGLU PERITONEAL CAVITY   Protein, pleural or peritoneal fluid     Status: None   Collection Time: 06/17/21  2:45 PM  Result Value Ref Range   Total protein, fluid 3.8 g/dL   Fluid Type-FTP Peritoneal   Albumin, pleural or peritoneal fluid      Status: None   Collection Time: 06/17/21  2:45 PM  Result Value Ref Range   Albumin, Fluid 1.4 g/dL   Fluid Type-FALB Peritoneal   Culture, body fluid w Gram Stain-bottle     Status: None (Preliminary result)   Collection Time: 06/17/21  2:45 PM   Specimen: Fluid  Result Value Ref Range   Specimen Description FLUID PERITONEAL    Special Requests BOTTLES DRAWN AEROBIC AND ANAEROBIC    Culture      NO GROWTH < 12 HOURS Performed at [facility], [address] [place], [place] [address]    Report Status PENDING   Gram stain     Status: None   Collection Time: 06/17/21  2:45 PM   Specimen: Fluid  Result Value Ref Range   Specimen Description FLUID PERITONEAL    Special Requests NONE    Gram Stain      ABUNDANT WBC PRESENT,  PREDOMINANTLY MONONUCLEAR NO ORGANISMS SEEN Performed at [facility], [place] [address] [place], [place] [address]    Report Status 06/18/2021 FINAL   Protein, total     Status: Abnormal   Collection Time: 06/17/21  5:38 PM  Result Value Ref Range   Total Protein 5.6 (L) 6.5 - 8.1 g/dL  Renal function panel     Status: Abnormal   Collection Time: 06/18/21  7:39 AM  Result Value Ref Range   Sodium 131 (L) 135 - 145 mmol/L   Potassium 3.8 3.5 - 5.1 mmol/L   Chloride 94 (L) 98 - 111 mmol/L   CO2 26 22 - 32 mmol/L   Glucose, Bld 118 (H) 70 - 99 mg/dL   BUN 63 (H) 6 - 20 mg/dL   Creatinine, Ser 6.12 (H) 0.61 - 1.24 mg/dL   Calcium 8.0 (L) 8.9 - 10.3 mg/dL   Phosphorus 3.6 2.5 - 4.6 mg/dL   Albumin 1.7 (L) 3.5 - 5.0 g/dL   GFR, Estimated 10 (L) >60 mL/min   Anion gap 11 5 - 15  CBC     Status: Abnormal   Collection Time: 06/18/21  7:39 AM  Result Value Ref Range   WBC 11.5 (H) 4.0 - 10.5 K/uL   RBC 3.04 (L) 4.22 - 5.81 MIL/uL   Hemoglobin 8.8 (L) 13.0 - 17.0 g/dL   HCT 26.4 (L) 39.0 - 52.0 %   MCV 86.8 80.0 - 100.0 fL   MCH 28.9 26.0 - 34.0 pg   MCHC 33.3 30.0 - 36.0 g/dL   RDW 14.2 11.5 - 15.5 %   Platelets 381 150 - 400 K/uL   nRBC 0.0 0.0 - 0.2 %   Recent Results (from the past 240 hour(s))  SARS CORONAVIRUS 2 (TAT 6-24 HRS) Nasopharyngeal Nasopharyngeal Swab     Status: None   Collection Time:  06/11/21  6:13 PM   Specimen: Nasopharyngeal Swab  Result Value Ref Range Status   SARS Coronavirus 2 NEGATIVE NEGATIVE Final    Comment: (NOTE) SARS-CoV-2 target nucleic acids are NOT DETECTED.  The SARS-CoV-2 RNA is generally detectable in upper and lower respiratory specimens during the acute phase of infection. Negative results do not preclude SARS-CoV-2 infection, do not rule out co-infections with other pathogens, and should not be used as the sole basis for treatment or other patient management decisions. Negative results must be combined with clinical  observations, patient history, and epidemiological information. The expected result is Negative.  Fact Sheet for Patients: SugarRoll.be  Fact Sheet for Healthcare Providers: https://www.woods-mathews.com/  This test is not yet approved or cleared by the [place] FDA and  has been authorized for detection and/or diagnosis of SARS-CoV-2 by FDA under an Emergency Use Authorization (EUA). This EUA will remain  in effect (meaning this test can be used) for the duration of the COVID-19 declaration under Se ction 564(b)(1) of the Act, 21 U.S.C. section 360bbb-3(b)(1), unless the authorization is terminated or revoked sooner.  Performed at [facility], [place] [address] [place], [place] [address]   Culture, body fluid w Gram Stain-bottle     Status: Abnormal   Collection Time: 06/13/21  3:04 PM   Specimen: Fluid  Result Value Ref Range Status   Specimen Description FLUID ABDOMEN  Final   Special Requests BOTTLES DRAWN AEROBIC AND ANAEROBIC  Final   Gram Stain   Final    GRAM NEGATIVE RODS ANAEROBIC BOTTLE ONLY CRITICAL RESULT CALLED TO, READ BACK BY AND VERIFIED WITH: J. RICHARDSON RN, AT [phone] 06/15/21 [place] Performed at [facility], [place] [address] [place], [place] [address]    Culture ESCHERICHIA COLI (A)  Final   Report Status 06/16/2021 FINAL  Final   Organism ID, Bacteria ESCHERICHIA COLI  Final      Susceptibility   Escherichia coli - MIC*    AMPICILLIN >=32 RESISTANT Resistant     CEFAZOLIN 16 SENSITIVE Sensitive     CEFEPIME <=0.12 SENSITIVE Sensitive     CEFTAZIDIME <=1 SENSITIVE Sensitive     CEFTRIAXONE <=0.25 SENSITIVE Sensitive     CIPROFLOXACIN <=0.25 SENSITIVE Sensitive     GENTAMICIN <=1 SENSITIVE Sensitive     IMIPENEM <=0.25 SENSITIVE Sensitive     TRIMETH/SULFA >=320 RESISTANT Resistant     AMPICILLIN/SULBACTAM >=32 RESISTANT Resistant     PIP/TAZO <=4 SENSITIVE Sensitive     * ESCHERICHIA COLI   Gram stain     Status: None   Collection Time: 06/13/21  3:04 PM   Specimen: Fluid  Result Value Ref Range Status   Specimen Description FLUID ABDOMEN  Final   Special Requests NONE  Final   Gram Stain   Final    WBC PRESENT, PREDOMINANTLY MONONUCLEAR CYTOSPIN SMEAR Performed at [facility], [place] [address] [place], [place] [address]    Report Status 06/13/2021 FINAL  Final  Culture, body fluid w Gram Stain-bottle     Status: None (Preliminary result)   Collection Time: 06/17/21  2:45 PM   Specimen: Fluid  Result Value Ref Range Status   Specimen Description FLUID PERITONEAL  Final   Special Requests BOTTLES DRAWN AEROBIC AND ANAEROBIC  Final   Culture   Final    NO GROWTH < 12 HOURS Performed at [facility], [place] [address], [place],  [address]    Report Status PENDING  Incomplete  Gram stain     Status: None   Collection Time: 06/17/21  2:45 PM   Specimen: Fluid  Result Value Ref Range Status   Specimen Description FLUID PERITONEAL  Final   Special Requests NONE  Final   Gram Stain   Final    ABUNDANT WBC PRESENT, PREDOMINANTLY MONONUCLEAR NO ORGANISMS SEEN Performed at [facility], [address] [place], [place] [address]    Report Status 06/18/2021 FINAL  Final    Renal Function: Recent Labs    06/11/21 1427 06/12/21 0918 06/13/21 1002 06/14/21 0134 06/15/21 0035 06/18/21 0739  CREATININE 3.93* 4.92* 6.43* 7.15* 5.23* 6.12*   Estimated Creatinine Clearance: 12.1 mL/min (A) (by C-G formula based on SCr of 6.12 mg/dL (H)).  Radiologic Imaging: DG Abd 1 View  Result Date: 06/16/2021 CLINICAL DATA:  Abdominal pain. EXAM: ABDOMEN - 1 VIEW COMPARISON:  CT of the abdomen pelvis dated 06/11/2021. FINDINGS: Several normal caliber air-filled loops of small bowel in the right hemiabdomen. No bowel dilatation or evidence of obstruction. Moderate stool throughout the colon. No free air. Multiple surgical clips. Atherosclerotic calcification of  the aorta. No acute osseous pathology. IMPRESSION: Nonobstructive bowel gas pattern. Electronically Signed   By: Anner Crete M.D.   On: 06/16/2021 21:22   CT CHEST W CONTRAST  Result Date: 06/17/2021 CLINICAL DATA:  Pneumonia, effusion or abscess suspected, xray done; Abdominal pain, fever Bibasilar disease on CT abdomen and pelvis. New ascites with concern for metastatic cancer with new renal mass. Laparotomy 1 month ago per electronic medical records. EXAM: CT CHEST, ABDOMEN, AND PELVIS WITH CONTRAST TECHNIQUE: Multidetector CT imaging of the chest, abdomen and pelvis was performed following the standard protocol during bolus administration of intravenous contrast. CONTRAST:  158mL OMNIPAQUE IOHEXOL 350 MG/ML SOLN COMPARISON:  Abdominal CT 6 days ago 06/11/2021. Most recent chest imaging radiograph from December of 2021. FINDINGS: CT CHEST FINDINGS Cardiovascular: Aortic atherosclerosis. No aneurysm or dissection. No central pulmonary embolus. Mild cardiomegaly with coronary artery calcifications. No pericardial effusion. Mediastinum/Nodes: No enlarged mediastinal or hilar lymph nodes. No esophageal wall thickening. No thyroid nodule. Lungs/Pleura: Bandlike opacities throughout the lower lobes. This is slightly more confluent on the right where there are central air bronchograms. Slightly rounded subpleural appearance on the left suggestive of round atelectasis. There is a small left pleural effusion. Occasional areas of perifissural and subpleural ground-glass opacity in the upper lobes. There is no dominant pulmonary mass. No pulmonary nodule. Trachea and central bronchi are patent. Musculoskeletal: Slight ground-glass density of the osseous structures suggesting renal osteodystrophy. No focal bone lesion or acute osseous abnormality. CT ABDOMEN PELVIS FINDINGS Hepatobiliary: Focal hepatic lesion. Slight decreased hepatic density typical of steatosis. Thick-walled gallbladder is nonspecific in the  setting of ascites. Punctate gallstone, assessed with recent ultrasound. No common bile duct dilatation. Pancreas: 5 mm pancreatic ductal dilatation. This is unchanged from prior exam. No evidence of pancreatic mass. Spleen: Subtle subcentimeter low-density in the inferior spleen. Additional areas of decreased density on prior exam are no longer seen. Cyst in the superior spleen measures 18 mm, unchanged. Adrenals/Urinary Tract: No adrenal nodule. Sequela of chronic renal disease with renal parenchymal atrophy and multiple cysts. There is a 3.1 Cm more solid-appearing lesion in the anterior right kidney. No hydronephrosis. Absent renal excretion on delayed phase imaging consistent with chronic kidney disease. Urinary bladder is nondistended. Stomach/Bowel: Ingested material and pills in the stomach. Ingested material reaches the distal small bowel and proximal colon. Slightly edematous small  bowel appearance of left abdominal bowel loops may be related to adjacent ascites. Enteric sutures in the right abdomen, suspected prior right hepatic colectomy. No small bowel obstruction. Small to moderate colonic stool burden. Vascular/Lymphatic: Aortic atherosclerosis and tortuosity. Patent portal vein. No perirenal or retroperitoneal adenopathy. Abdominal or pelvic adenopathy. Reproductive: Unremarkable prostate. The lower most field-of-view series 3, image 141 there is air adjacent to the peroneal soft tissues, it is unclear if this represents soft tissue air or simply air within adjacent soft tissue structures. Other: Moderate volume abdominopelvic ascites. No convincing omental thickening. Generalized edema of the subcutaneous tissues. No free intra-abdominal air. Postsurgical change of the abdominal wall. Musculoskeletal: Slight ground-glass density of the osseous structures suggesting renal osteodystrophy. No focal bone lesion. IMPRESSION: 1. Bandlike opacities throughout the lower lobes, slightly more confluent on the  right where there are central air bronchograms. Findings may represent atelectasis or infection. There is a small left pleural effusion. 2. Occasional areas of perifissural and subpleural ground-glass opacity in the upper lobes, may be infectious or inflammatory. 3. No pulmonary nodule, mass, or evidence of intrathoracic malignancy. 4. Indeterminate solid-appearing lesion in the right mid kidney measuring approximately 3.1 cm, not significantly changed from recent exam, remaining suspicious for neoplasm. No adenopathy. 5. 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 Subtle subcentimeter low-density in the inferior spleen, nonspecific. Additional areas of decreased density on prior exam are no longer seen. This may represent splenic infarcts sequela, less likely metastatic disease given findings have improved from recent prior exam. 6. Moderate volume abdominopelvic ascites. Generalized edema of the subcutaneous tissues. 7. Thick-walled gallbladder is nonspecific in the setting of ascites. Punctate gallstone, assessed with recent ultrasound. 8. Stable pancreatic ductal dilatation. 9. The lower most field-of-view there is air adjacent to the perineal soft tissues, it is unclear if this represents soft tissue air or simply air within adjacent soft tissue structures. Recommend correlation with physical exam for symptoms of crepitus or soft tissue infection. Air coursing adjacent to soft tissue structures is favored on reformats. Aortic Atherosclerosis (ICD10-I70.0). Electronically Signed   By: Keith Rake M.D.   On: 06/17/2021 22:24   CT ABDOMEN PELVIS W CONTRAST  Result Date: 06/17/2021 CLINICAL DATA:  Pneumonia, effusion or abscess suspected, xray done; Abdominal pain, fever Bibasilar disease on CT abdomen and pelvis. New ascites with concern for metastatic cancer with new renal mass. Laparotomy 1 month ago per electronic medical records. EXAM: CT CHEST, ABDOMEN, AND PELVIS WITH CONTRAST TECHNIQUE: Multidetector CT imaging of the  chest, abdomen and pelvis was performed following the standard protocol during bolus administration of intravenous contrast. CONTRAST:  168mL OMNIPAQUE IOHEXOL 350 MG/ML SOLN COMPARISON:  Abdominal CT 6 days ago 06/11/2021. Most recent chest imaging radiograph from December of 2021. FINDINGS: CT CHEST FINDINGS Cardiovascular: Aortic atherosclerosis. No aneurysm or dissection. No central pulmonary embolus. Mild cardiomegaly with coronary artery calcifications. No pericardial effusion. Mediastinum/Nodes: No enlarged mediastinal or hilar lymph nodes. No esophageal wall thickening. No thyroid nodule. Lungs/Pleura: Bandlike opacities throughout the lower lobes. This is slightly more confluent on the right where there are central air bronchograms. Slightly rounded subpleural appearance on the left suggestive of round atelectasis. There is a small left pleural effusion. Occasional areas of perifissural and subpleural ground-glass opacity in the upper lobes. There is no dominant pulmonary mass. No pulmonary nodule. Trachea and central bronchi are patent. Musculoskeletal: Slight ground-glass density of the osseous structures suggesting renal osteodystrophy. No focal bone lesion or acute osseous abnormality. CT ABDOMEN PELVIS FINDINGS Hepatobiliary: Focal hepatic lesion.  Slight decreased hepatic density typical of steatosis. Thick-walled gallbladder is nonspecific in the setting of ascites. Punctate gallstone, assessed with recent ultrasound. No common bile duct dilatation. Pancreas: 5 mm pancreatic ductal dilatation. This is unchanged from prior exam. No evidence of pancreatic mass. Spleen: Subtle subcentimeter low-density in the inferior spleen. Additional areas of decreased density on prior exam are no longer seen. Cyst in the superior spleen measures 18 mm, unchanged. Adrenals/Urinary Tract: No adrenal nodule. Sequela of chronic renal disease with renal parenchymal atrophy and multiple cysts. There is a 3.1 Cm more  solid-appearing lesion in the anterior right kidney. No hydronephrosis. Absent renal excretion on delayed phase imaging consistent with chronic kidney disease. Urinary bladder is nondistended. Stomach/Bowel: Ingested material and pills in the stomach. Ingested material reaches the distal small bowel and proximal colon. Slightly edematous small bowel appearance of left abdominal bowel loops may be related to adjacent ascites. Enteric sutures in the right abdomen, suspected prior right hepatic colectomy. No small bowel obstruction. Small to moderate colonic stool burden. Vascular/Lymphatic: Aortic atherosclerosis and tortuosity. Patent portal vein. No perirenal or retroperitoneal adenopathy. Abdominal or pelvic adenopathy. Reproductive: Unremarkable prostate. The lower most field-of-view series 3, image 141 there is air adjacent to the peroneal soft tissues, it is unclear if this represents soft tissue air or simply air within adjacent soft tissue structures. Other: Moderate volume abdominopelvic ascites. No convincing omental thickening. Generalized edema of the subcutaneous tissues. No free intra-abdominal air. Postsurgical change of the abdominal wall. Musculoskeletal: Slight ground-glass density of the osseous structures suggesting renal osteodystrophy. No focal bone lesion. IMPRESSION: 1. Bandlike opacities throughout the lower lobes, slightly more confluent on the right where there are central air bronchograms. Findings may represent atelectasis or infection. There is a small left pleural effusion. 2. Occasional areas of perifissural and subpleural ground-glass opacity in the upper lobes, may be infectious or inflammatory. 3. No pulmonary nodule, mass, or evidence of intrathoracic malignancy. 4. Indeterminate solid-appearing lesion in the right mid kidney measuring approximately 3.1 cm, not significantly changed from recent exam, remaining suspicious for neoplasm. No adenopathy. 5. 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  Subtle subcentimeter low-density in the inferior spleen, nonspecific. Additional areas of decreased density on prior exam are no longer seen. This may represent splenic infarcts sequela, less likely metastatic disease given findings have improved from recent prior exam. 6. Moderate volume abdominopelvic ascites. Generalized edema of the subcutaneous tissues. 7. Thick-walled gallbladder is nonspecific in the setting of ascites. Punctate gallstone, assessed with recent ultrasound. 8. Stable pancreatic ductal dilatation. 9. The lower most field-of-view there is air adjacent to the perineal soft tissues, it is unclear if this represents soft tissue air or simply air within adjacent soft tissue structures. Recommend correlation with physical exam for symptoms of crepitus or soft tissue infection. Air coursing adjacent to soft tissue structures is favored on reformats. Aortic Atherosclerosis (ICD10-I70.0). Electronically Signed   By: Keith Rake M.D.   On: 06/17/2021 22:24    I independently reviewed the above imaging studies.  Impression/Recommendation 60 year old male with an indeterminate right renal mass  -MRI abdomen pending to further characterize the lesion noted on recent abdominal ultrasound and CT.  The hypodense nature and lack of internal blood flow leads me to believe that this may potentially be a proteinaceous or hemorrhagic cyst.  Due to his complex surgical history a nephrectomy would be technically challenging and high risk.  We will arrange outpatient follow-up for further discussion of treatment and/for surveillance.  Ellison Hughs, MD [facility] 06/18/2021, 10:21 AM

## 2021-06-18 NOTE — Progress Notes (Signed)
Pt has a small, firm bump on his sacrum, just above the butt crack. He states that it is burning. Applied barrier cream per pt request and notified MD.

## 2021-06-18 NOTE — Progress Notes (Signed)
Pt arrived to HD unit from [facility] 6N15 w/sitter @ bedside  Initially stating he was cramping about 1 hr in, but after UF paused, he stated he wanted off. Stated various vague complaints, seemingly unrelated to his dialysis, but all amounting to he was in pain, and needed to get his meds, and wanted to go back to his room, despite being offered different alternatives to his discomfort. So, after 11/2 hr dialyzing, pt taken off. Educated as to the risks of ending treatment prematurely. Verbalized understanding.  Dr. Justin Mend nephrologist informed.  Pt signed AMA.  Taken off.  ~100 mL removed total.  Transferred back to 6N15, RN given report

## 2021-06-18 NOTE — Progress Notes (Signed)
Received a page about patient's CT result. Imaging was concerning for questionable air in the soft tissue of the perineum that was present only on one view. Radiology said this was likely artifact but recommended clinical correlation to rule out necrotizing fasciitis . Went to bedside and examined patient with nursing supervision. No crepitus, tenderness, erythema, or skin changes noted on the perineum or testicular area. Patient asymptomatic and not complaining of pain in that region. Low suspicion for necrotizing fasciitis at this time.

## 2021-06-18 NOTE — TOC Progression Note (Signed)
Transition of Care [facility]) - Progression Note    Patient Details  Name: Timothy [name] MRN: [id number] Date of Birth: November 18, 1960  Transition of Care [facility]) CM/SW [place], [place] Phone Number: 06/18/2021, 4:11 PM  Clinical Narrative:    CSW received a consult to speak with pt.  CSW met with pt at bedside and introduce self.  Pt stated " they cut off my disability".  CSW asked pt several questions concerning his possible termination of disability.  CSW gave pt the phone number for Social Security and resources for psychiatry and counseling.     Expected Discharge Plan: [place]    Expected Discharge Plan and Services Expected Discharge Plan: [place]   Discharge Planning Services: CM Consult Post Acute Care Choice: Home Health, Durable Medical Equipment Living arrangements for the past 2 months: Apartment                 DME Arranged: [place] rolling DME Agency: [employer] Date DME Agency Contacted: 06/14/21 Time DME Agency Contacted: 83   [place] Arranged: PT           Social Determinants of Health ([place]) Interventions    Readmission Risk Interventions No flowsheet data found.

## 2021-06-18 NOTE — Progress Notes (Addendum)
Subjective: Patient seen during HD this morning. States he is having abdominal cramping and a left sided pulling sensation. He states the pain improved since we adjusted the pain medicines yesterday but is still ongoing. He is still having suicidal thoughts but denies an active plan at this moment.   Objective:  Vital signs in last 24 hours: Vitals:   06/18/21 0800 06/18/21 0830 06/18/21 0845 06/18/21 0900  BP: (!) 164/91 (!) 161/98 (!) 161/91 (!) 152/83  Pulse: 84 83 85 85  Resp:      Temp:      TempSrc:      SpO2:    100%  Weight:    66.5 kg  Height:       Physical Exam General: alert, appears stated age, in no acute distress Abdomen: Soft, distended, bowel sounds present, no tenderness to palpation Skin: Warm and dry Psych: suicidal ideations present, denies an active plan, normal affect  Assessment/Plan:  Active Problems:   Ileus (HCC)   Protein-calorie malnutrition, severe   Bacterial peritonitis ([place])   Other ascites  Timothy [name] is a 60 yo M with PMHx of ESRD on HD TTS, colon cancer s/p resection, HFpEF, HTN, and polysubstance use who was admitted for ileus and found to have bacterial peritonitis.   Bacterial peritonitis  Ileus, resolved S/p a recent exploratory laparotomy with lysis of adhesions on 05/23/2021. Adhesions likely due to bowel resection for colon cancer in 2014. He [facility] on [facility] for ongoing abdominal pain and ileus. His Ileus has since resolved but he was found to have ascites, now s/p 2 paracentesis. After initial paracentesis he was started on IV antibiotics for possible secondary bacterial peritonitis vs malignant ascites. Peritoneal fluid culture did grow E Coli. Yesterday he had a repeat paracentesis (diagnostic and therapeutic) with 2.4L drained and fluid analysis shows a gram stain with abundant WBC present, predominantly mononuclear, no organisms seen; normal total protein, albumin and glucose, increased LDH. Cytology is pending. SAAG is  0.3. He also had a repeat CT abdomen/pelvis yesterday which showed moderate volume ascites, thick walled gallbladder and air in the perineal soft tissues. Findings suggestive of secondary bacterial peritonitis. Less likely malignant ascites as previous splenic hypo densities no longer seen on CT and no other metastatic disease seen on imaging. General surgery on board, will await recommendations. - Appreciate General surgery and GI recommendations - Continue ceftriaxone  - Follow fluid culture and cytology - Pain control with oral oxycodone-acetaminophen 5-325 mg q6h and IV dilaudid 1 mg q3h prn for severe breakthrough pain  Suicidal ideations  Patient reported suicidal ideations and plan yesterday evening. He was feeling very depressed due to his pain and financial issues regarding his social security. He is still having SI this morning but denies a plan.  - Consulted psychiatry and social work, appreciate assistance - Suicide precautions + sitter  ESRD on HD  HD today. On his T/Th/Sat schedule.  - Nephrology on board, appreciate recommendations   HTN Blood pressures have been fluctuating, will continue to monitor after HD. Unfortunately, only able to obtain his BP readings on his distal RUE which is making it difficult to know if we are getting an appropriate assessment.  - Continue diltiazem 360 mg daily - Started on oral hydralazine 25 mg daily, may need to up titrate to therapeutic dosing    Renal mass  Image findings suspicious for renal cell carcinoma. Previously described splenic hypodensities no longer appreciated on repeat imaging, thus less likely malignant. CT  chest negative for any metastatic lesions. - Consulted urology, appreciate recommendations   Prior to Admission Living Arrangement: Home Anticipated Discharge Location: Home Barriers to Discharge: Clinical improvement Dispo: Anticipated discharge pending clinical improvement.   Timothy Nappier N, DO 06/18/2021, 11:19  AM Pager: [phone] After 5pm on weekdays and 1pm on weekends: On Call pager [phone]

## 2021-06-18 NOTE — Progress Notes (Signed)
[facility] Progress Note   Subjective:  Seen on HD - 1.5L UFG and tolerating. No CP or dyspnea. Psych consult pending, active SI overnight with frustration of medical and financial issues. Sitter in place. S/p repeat paracentesis yesterday - 2.3L, cell count still high but improving from prior (3600 -> 1221). Repeat CT with thickened GB, and concern for perineal soft-tissue gas, but IM examined area and no clinical concern for infection. Surgery following, await plan.  Objective Vitals:   06/18/21 0735 06/18/21 0745 06/18/21 0800 06/18/21 0830  BP: (!) 172/99 (!) 180/102 (!) 164/91 (!) 161/98  Pulse: 81 91 84 83  Resp:      Temp:      TempSrc:      SpO2:      Weight:      Height:       Physical Exam General: Well appearing man, NAD. Room air. Heart: RRR; no murmur Lungs: CTA anteriorly Abdomen: distended, soft and mildly tender Extremities: No LE edema Dialysis Access: LUE AVF + thrill  Additional Objective Labs: Basic Metabolic Panel: Recent Labs  Lab 06/12/21 0918 06/13/21 1002 06/14/21 0134 06/15/21 0035 06/18/21 0739  NA 137 134* 132* 132* 131*  K 4.4 5.7* 5.5* 4.4 3.8  CL 94* 89* 90* 93* 94*  CO2 31 30 26 28 26   GLUCOSE 68* 83 94 96 118*  BUN 30* 54* 73* 47* 63*  CREATININE 4.92* 6.43* 7.15* 5.23* 6.12*  CALCIUM 8.8* 9.0 8.3* 8.0* 8.0*  PHOS 6.3* 9.1*  --   --  3.6   Liver Function Tests: Recent Labs  Lab 06/11/21 1427 06/12/21 0918 06/13/21 1002 06/17/21 1738 06/18/21 0739  AST 18  --   --   --   --   ALT 9  --   --   --   --   ALKPHOS 59  --   --   --   --   BILITOT 0.6  --   --   --   --   PROT 6.4*  --   --  5.6*  --   ALBUMIN 2.4* 2.1* 2.1*  --  1.7*   Recent Labs  Lab 06/11/21 1427  LIPASE 50   CBC: Recent Labs  Lab 06/12/21 0918 06/13/21 1002 06/14/21 0134 06/15/21 0035 06/18/21 0739  WBC 15.2* 14.3* 10.6* 12.5* 11.5*  NEUTROABS 13.1* 12.3* 9.2*  --   --   HGB 11.6* 12.1* 10.2* 9.4* 8.8*  HCT 34.3* 34.7* 29.4*  26.7* 26.4*  MCV 87.7 85.9 85.7 84.8 86.8  PLT 369 463* 384 313 381   Blood Culture    Component Value Date/Time   SDES FLUID PERITONEAL 06/17/2021 1445   SDES FLUID PERITONEAL 06/17/2021 1445   SPECREQUEST BOTTLES DRAWN AEROBIC AND ANAEROBIC 06/17/2021 1445   SPECREQUEST NONE 06/17/2021 1445   CULT  06/17/2021 1445    NO GROWTH < 12 HOURS Performed at [facility], [place] [address] [place], [place] [address]    REPTSTATUS PENDING 06/17/2021 1445   REPTSTATUS PENDING 06/17/2021 1445   Studies/Results: DG Abd 1 View  Result Date: 06/16/2021 CLINICAL DATA:  Abdominal pain. EXAM: ABDOMEN - 1 VIEW COMPARISON:  CT of the abdomen pelvis dated 06/11/2021. FINDINGS: Several normal caliber air-filled loops of small bowel in the right hemiabdomen. No bowel dilatation or evidence of obstruction. Moderate stool throughout the colon. No free air. Multiple surgical clips. Atherosclerotic calcification of the aorta. No acute osseous pathology. IMPRESSION: Nonobstructive bowel gas pattern. Electronically Signed  By: Anner Crete M.D.   On: 06/16/2021 21:22   CT CHEST W CONTRAST  Result Date: 06/17/2021 CLINICAL DATA:  Pneumonia, effusion or abscess suspected, xray done; Abdominal pain, fever Bibasilar disease on CT abdomen and pelvis. New ascites with concern for metastatic cancer with new renal mass. Laparotomy 1 month ago per electronic medical records. EXAM: CT CHEST, ABDOMEN, AND PELVIS WITH CONTRAST TECHNIQUE: Multidetector CT imaging of the chest, abdomen and pelvis was performed following the standard protocol during bolus administration of intravenous contrast. CONTRAST:  143mL OMNIPAQUE IOHEXOL 350 MG/ML SOLN COMPARISON:  Abdominal CT 6 days ago 06/11/2021. Most recent chest imaging radiograph from December of 2021. FINDINGS: CT CHEST FINDINGS Cardiovascular: Aortic atherosclerosis. No aneurysm or dissection. No central pulmonary embolus. Mild cardiomegaly with coronary artery  calcifications. No pericardial effusion. Mediastinum/Nodes: No enlarged mediastinal or hilar lymph nodes. No esophageal wall thickening. No thyroid nodule. Lungs/Pleura: Bandlike opacities throughout the lower lobes. This is slightly more confluent on the right where there are central air bronchograms. Slightly rounded subpleural appearance on the left suggestive of round atelectasis. There is a small left pleural effusion. Occasional areas of perifissural and subpleural ground-glass opacity in the upper lobes. There is no dominant pulmonary mass. No pulmonary nodule. Trachea and central bronchi are patent. Musculoskeletal: Slight ground-glass density of the osseous structures suggesting renal osteodystrophy. No focal bone lesion or acute osseous abnormality. CT ABDOMEN PELVIS FINDINGS Hepatobiliary: Focal hepatic lesion. Slight decreased hepatic density typical of steatosis. Thick-walled gallbladder is nonspecific in the setting of ascites. Punctate gallstone, assessed with recent ultrasound. No common bile duct dilatation. Pancreas: 5 mm pancreatic ductal dilatation. This is unchanged from prior exam. No evidence of pancreatic mass. Spleen: Subtle subcentimeter low-density in the inferior spleen. Additional areas of decreased density on prior exam are no longer seen. Cyst in the superior spleen measures 18 mm, unchanged. Adrenals/Urinary Tract: No adrenal nodule. Sequela of chronic renal disease with renal parenchymal atrophy and multiple cysts. There is a 3.1 Cm more solid-appearing lesion in the anterior right kidney. No hydronephrosis. Absent renal excretion on delayed phase imaging consistent with chronic kidney disease. Urinary bladder is nondistended. Stomach/Bowel: Ingested material and pills in the stomach. Ingested material reaches the distal small bowel and proximal colon. Slightly edematous small bowel appearance of left abdominal bowel loops may be related to adjacent ascites. Enteric sutures in the  right abdomen, suspected prior right hepatic colectomy. No small bowel obstruction. Small to moderate colonic stool burden. Vascular/Lymphatic: Aortic atherosclerosis and tortuosity. Patent portal vein. No perirenal or retroperitoneal adenopathy. Abdominal or pelvic adenopathy. Reproductive: Unremarkable prostate. The lower most field-of-view series 3, image 141 there is air adjacent to the peroneal soft tissues, it is unclear if this represents soft tissue air or simply air within adjacent soft tissue structures. Other: Moderate volume abdominopelvic ascites. No convincing omental thickening. Generalized edema of the subcutaneous tissues. No free intra-abdominal air. Postsurgical change of the abdominal wall. Musculoskeletal: Slight ground-glass density of the osseous structures suggesting renal osteodystrophy. No focal bone lesion. IMPRESSION: 1. Bandlike opacities throughout the lower lobes, slightly more confluent on the right where there are central air bronchograms. Findings may represent atelectasis or infection. There is a small left pleural effusion. 2. Occasional areas of perifissural and subpleural ground-glass opacity in the upper lobes, may be infectious or inflammatory. 3. No pulmonary nodule, mass, or evidence of intrathoracic malignancy. 4. Indeterminate solid-appearing lesion in the right mid kidney measuring approximately 3.1 cm, not significantly changed from recent exam, remaining suspicious  for neoplasm. No adenopathy. 5. 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 Subtle subcentimeter low-density in the inferior spleen, nonspecific. Additional areas of decreased density on prior exam are no longer seen. This may represent splenic infarcts sequela, less likely metastatic disease given findings have improved from recent prior exam. 6. Moderate volume abdominopelvic ascites. Generalized edema of the subcutaneous tissues. 7. Thick-walled gallbladder is nonspecific in the setting of ascites. Punctate gallstone,  assessed with recent ultrasound. 8. Stable pancreatic ductal dilatation. 9. The lower most field-of-view there is air adjacent to the perineal soft tissues, it is unclear if this represents soft tissue air or simply air within adjacent soft tissue structures. Recommend correlation with physical exam for symptoms of crepitus or soft tissue infection. Air coursing adjacent to soft tissue structures is favored on reformats. Aortic Atherosclerosis (ICD10-I70.0). Electronically Signed   By: Keith Rake M.D.   On: 06/17/2021 22:24   CT ABDOMEN PELVIS W CONTRAST  Result Date: 06/17/2021 CLINICAL DATA:  Pneumonia, effusion or abscess suspected, xray done; Abdominal pain, fever Bibasilar disease on CT abdomen and pelvis. New ascites with concern for metastatic cancer with new renal mass. Laparotomy 1 month ago per electronic medical records. EXAM: CT CHEST, ABDOMEN, AND PELVIS WITH CONTRAST TECHNIQUE: Multidetector CT imaging of the chest, abdomen and pelvis was performed following the standard protocol during bolus administration of intravenous contrast. CONTRAST:  156mL OMNIPAQUE IOHEXOL 350 MG/ML SOLN COMPARISON:  Abdominal CT 6 days ago 06/11/2021. Most recent chest imaging radiograph from December of 2021. FINDINGS: CT CHEST FINDINGS Cardiovascular: Aortic atherosclerosis. No aneurysm or dissection. No central pulmonary embolus. Mild cardiomegaly with coronary artery calcifications. No pericardial effusion. Mediastinum/Nodes: No enlarged mediastinal or hilar lymph nodes. No esophageal wall thickening. No thyroid nodule. Lungs/Pleura: Bandlike opacities throughout the lower lobes. This is slightly more confluent on the right where there are central air bronchograms. Slightly rounded subpleural appearance on the left suggestive of round atelectasis. There is a small left pleural effusion. Occasional areas of perifissural and subpleural ground-glass opacity in the upper lobes. There is no dominant pulmonary mass.  No pulmonary nodule. Trachea and central bronchi are patent. Musculoskeletal: Slight ground-glass density of the osseous structures suggesting renal osteodystrophy. No focal bone lesion or acute osseous abnormality. CT ABDOMEN PELVIS FINDINGS Hepatobiliary: Focal hepatic lesion. Slight decreased hepatic density typical of steatosis. Thick-walled gallbladder is nonspecific in the setting of ascites. Punctate gallstone, assessed with recent ultrasound. No common bile duct dilatation. Pancreas: 5 mm pancreatic ductal dilatation. This is unchanged from prior exam. No evidence of pancreatic mass. Spleen: Subtle subcentimeter low-density in the inferior spleen. Additional areas of decreased density on prior exam are no longer seen. Cyst in the superior spleen measures 18 mm, unchanged. Adrenals/Urinary Tract: No adrenal nodule. Sequela of chronic renal disease with renal parenchymal atrophy and multiple cysts. There is a 3.1 Cm more solid-appearing lesion in the anterior right kidney. No hydronephrosis. Absent renal excretion on delayed phase imaging consistent with chronic kidney disease. Urinary bladder is nondistended. Stomach/Bowel: Ingested material and pills in the stomach. Ingested material reaches the distal small bowel and proximal colon. Slightly edematous small bowel appearance of left abdominal bowel loops may be related to adjacent ascites. Enteric sutures in the right abdomen, suspected prior right hepatic colectomy. No small bowel obstruction. Small to moderate colonic stool burden. Vascular/Lymphatic: Aortic atherosclerosis and tortuosity. Patent portal vein. No perirenal or retroperitoneal adenopathy. Abdominal or pelvic adenopathy. Reproductive: Unremarkable prostate. The lower most field-of-view series 3, image 141 there is air adjacent to the  peroneal soft tissues, it is unclear if this represents soft tissue air or simply air within adjacent soft tissue structures. Other: Moderate volume  abdominopelvic ascites. No convincing omental thickening. Generalized edema of the subcutaneous tissues. No free intra-abdominal air. Postsurgical change of the abdominal wall. Musculoskeletal: Slight ground-glass density of the osseous structures suggesting renal osteodystrophy. No focal bone lesion. IMPRESSION: 1. Bandlike opacities throughout the lower lobes, slightly more confluent on the right where there are central air bronchograms. Findings may represent atelectasis or infection. There is a small left pleural effusion. 2. Occasional areas of perifissural and subpleural ground-glass opacity in the upper lobes, may be infectious or inflammatory. 3. No pulmonary nodule, mass, or evidence of intrathoracic malignancy. 4. Indeterminate solid-appearing lesion in the right mid kidney measuring approximately 3.1 cm, not significantly changed from recent exam, remaining suspicious for neoplasm. No adenopathy. 5. 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 Subtle subcentimeter low-density in the inferior spleen, nonspecific. Additional areas of decreased density on prior exam are no longer seen. This may represent splenic infarcts sequela, less likely metastatic disease given findings have improved from recent prior exam. 6. Moderate volume abdominopelvic ascites. Generalized edema of the subcutaneous tissues. 7. Thick-walled gallbladder is nonspecific in the setting of ascites. Punctate gallstone, assessed with recent ultrasound. 8. Stable pancreatic ductal dilatation. 9. The lower most field-of-view there is air adjacent to the perineal soft tissues, it is unclear if this represents soft tissue air or simply air within adjacent soft tissue structures. Recommend correlation with physical exam for symptoms of crepitus or soft tissue infection. Air coursing adjacent to soft tissue structures is favored on reformats. Aortic Atherosclerosis (ICD10-I70.0). Electronically Signed   By: Keith Rake M.D.   On: 06/17/2021 22:24    Medications:  sodium chloride     sodium chloride     cefTRIAXone (ROCEPHIN)  IV 2 g (06/17/21 2215)    (feeding supplement) PROSource Plus  30 mL Oral BID BM   bisacodyl  10 mg Rectal Daily   Chlorhexidine Gluconate Cloth  6 each Topical Q0600   darbepoetin (ARANESP) injection - DIALYSIS  40 mcg Intravenous Q Thu-HD   diltiazem  360 mg Oral Daily   enoxaparin (LOVENOX) injection  30 mg Subcutaneous Q24H   feeding supplement (NEPRO CARB STEADY)  237 mL Oral TID BM   ferric citrate  420 mg Oral TID WC   hydrALAZINE  25 mg Oral Daily   multivitamin  1 tablet Oral QHS   ondansetron (ZOFRAN) IV  4 mg Intravenous Once   senna  1 tablet Oral Daily    Dialysis Orders: TTS at [facility] 3:45 min 180NRe 450/Autoflow 1.5 2.0K/2.0 Ca 65 kgs  UFP 4 AVF - No heparin - Mircera 50 mcg IV q 4 weeks (Last dose 05/12/2021)   Assessment/Plan: 1. Ileus: Resolving. 2. E.Coli peritonitis: Cx 8/29 +. On Ceftriaxone. Surgery following for GB sludge/wall thickening - unclear if surgical plan. Repeat paracentesis 9/2 with improving cell count (although still high), Cx pending. 2. ESRD: Continue HD per TTS schedule - HD today, no heparin. 3. Anemia: Hgb 8.8, continue Aranesp q Thursday. Holding IV iron for now. 4. Secondary hyperparathyroidism: Ca ok, Phos very high - continue Auryxia as binder. 5. HTN/volume: BP high, below prior EDW. No volume excess on exam except abdomen. 6. Nutrition: Alb 1.7 (low), continue protein supplements. 7. R renal mass: Needs OP follow-up. 8. Hep C  Veneta Penton, PA-C 06/18/2021, 8:56 AM  [employer]

## 2021-06-19 DIAGNOSIS — F322 Major depressive disorder, single episode, severe without psychotic features: Secondary | ICD-10-CM | POA: Diagnosis not present

## 2021-06-19 NOTE — Progress Notes (Signed)
[facility] Progress Note   Subjective:  Seen in room - sitter at bedside. C/o "I'm bloated", but no dyspnea. Says dialysis went fine yesterday.  Objective Vitals:   06/18/21 1719 06/18/21 2213 06/19/21 0416 06/19/21 0910  BP: (!) 158/75 (!) 145/99 138/72 (!) 189/106  Pulse: 85 85 85 86  Resp: 16 18 18    Temp: 98 F (36.7 C) 98.5 F (36.9 C) 98.5 F (36.9 C) 98.1 F (36.7 C)  TempSrc: Oral Oral Oral Oral  SpO2: 98% 95% 95% 97%  Weight:      Height:       Physical Exam General: Well appearing man, NAD. Room air. Heart: RRR; no murmur Lungs: CTA anteriorly Abdomen: distended, soft and mildly tender Extremities: No LE edema Dialysis Access: LUE AVF + thrill  Additional Objective Labs: Basic Metabolic Panel: Recent Labs  Lab 06/13/21 1002 06/14/21 0134 06/15/21 0035 06/18/21 0739  NA 134* 132* 132* 131*  K 5.7* 5.5* 4.4 3.8  CL 89* 90* 93* 94*  CO2 30 26 28 26   GLUCOSE 83 94 96 118*  BUN 54* 73* 47* 63*  CREATININE 6.43* 7.15* 5.23* 6.12*  CALCIUM 9.0 8.3* 8.0* 8.0*  PHOS 9.1*  --   --  3.6   Liver Function Tests: Recent Labs  Lab 06/13/21 1002 06/17/21 1738 06/18/21 0739  PROT  --  5.6*  --   ALBUMIN 2.1*  --  1.7*   CBC: Recent Labs  Lab 06/13/21 1002 06/14/21 0134 06/15/21 0035 06/18/21 0739  WBC 14.3* 10.6* 12.5* 11.5*  NEUTROABS 12.3* 9.2*  --   --   HGB 12.1* 10.2* 9.4* 8.8*  HCT 34.7* 29.4* 26.7* 26.4*  MCV 85.9 85.7 84.8 86.8  PLT 463* 384 313 381   Studies/Results: CT CHEST W CONTRAST  Result Date: 06/17/2021 CLINICAL DATA:  Pneumonia, effusion or abscess suspected, xray done; Abdominal pain, fever Bibasilar disease on CT abdomen and pelvis. New ascites with concern for metastatic cancer with new renal mass. Laparotomy 1 month ago per electronic medical records. EXAM: CT CHEST, ABDOMEN, AND PELVIS WITH CONTRAST TECHNIQUE: Multidetector CT imaging of the chest, abdomen and pelvis was performed following the standard  protocol during bolus administration of intravenous contrast. CONTRAST:  129mL OMNIPAQUE IOHEXOL 350 MG/ML SOLN COMPARISON:  Abdominal CT 6 days ago 06/11/2021. Most recent chest imaging radiograph from December of 2021. FINDINGS: CT CHEST FINDINGS Cardiovascular: Aortic atherosclerosis. No aneurysm or dissection. No central pulmonary embolus. Mild cardiomegaly with coronary artery calcifications. No pericardial effusion. Mediastinum/Nodes: No enlarged mediastinal or hilar lymph nodes. No esophageal wall thickening. No thyroid nodule. Lungs/Pleura: Bandlike opacities throughout the lower lobes. This is slightly more confluent on the right where there are central air bronchograms. Slightly rounded subpleural appearance on the left suggestive of round atelectasis. There is a small left pleural effusion. Occasional areas of perifissural and subpleural ground-glass opacity in the upper lobes. There is no dominant pulmonary mass. No pulmonary nodule. Trachea and central bronchi are patent. Musculoskeletal: Slight ground-glass density of the osseous structures suggesting renal osteodystrophy. No focal bone lesion or acute osseous abnormality. CT ABDOMEN PELVIS FINDINGS Hepatobiliary: Focal hepatic lesion. Slight decreased hepatic density typical of steatosis. Thick-walled gallbladder is nonspecific in the setting of ascites. Punctate gallstone, assessed with recent ultrasound. No common bile duct dilatation. Pancreas: 5 mm pancreatic ductal dilatation. This is unchanged from prior exam. No evidence of pancreatic mass. Spleen: Subtle subcentimeter low-density in the inferior spleen. Additional areas of decreased density on prior exam are no  longer seen. Cyst in the superior spleen measures 18 mm, unchanged. Adrenals/Urinary Tract: No adrenal nodule. Sequela of chronic renal disease with renal parenchymal atrophy and multiple cysts. There is a 3.1 Cm more solid-appearing lesion in the anterior right kidney. No  hydronephrosis. Absent renal excretion on delayed phase imaging consistent with chronic kidney disease. Urinary bladder is nondistended. Stomach/Bowel: Ingested material and pills in the stomach. Ingested material reaches the distal small bowel and proximal colon. Slightly edematous small bowel appearance of left abdominal bowel loops may be related to adjacent ascites. Enteric sutures in the right abdomen, suspected prior right hepatic colectomy. No small bowel obstruction. Small to moderate colonic stool burden. Vascular/Lymphatic: Aortic atherosclerosis and tortuosity. Patent portal vein. No perirenal or retroperitoneal adenopathy. Abdominal or pelvic adenopathy. Reproductive: Unremarkable prostate. The lower most field-of-view series 3, image 141 there is air adjacent to the peroneal soft tissues, it is unclear if this represents soft tissue air or simply air within adjacent soft tissue structures. Other: Moderate volume abdominopelvic ascites. No convincing omental thickening. Generalized edema of the subcutaneous tissues. No free intra-abdominal air. Postsurgical change of the abdominal wall. Musculoskeletal: Slight ground-glass density of the osseous structures suggesting renal osteodystrophy. No focal bone lesion. IMPRESSION: 1. Bandlike opacities throughout the lower lobes, slightly more confluent on the right where there are central air bronchograms. Findings may represent atelectasis or infection. There is a small left pleural effusion. 2. Occasional areas of perifissural and subpleural ground-glass opacity in the upper lobes, may be infectious or inflammatory. 3. No pulmonary nodule, mass, or evidence of intrathoracic malignancy. 4. Indeterminate solid-appearing lesion in the right mid kidney measuring approximately 3.1 cm, not significantly changed from recent exam, remaining suspicious for neoplasm. No adenopathy. 5. 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 Subtle subcentimeter low-density in the inferior spleen,  nonspecific. Additional areas of decreased density on prior exam are no longer seen. This may represent splenic infarcts sequela, less likely metastatic disease given findings have improved from recent prior exam. 6. Moderate volume abdominopelvic ascites. Generalized edema of the subcutaneous tissues. 7. Thick-walled gallbladder is nonspecific in the setting of ascites. Punctate gallstone, assessed with recent ultrasound. 8. Stable pancreatic ductal dilatation. 9. The lower most field-of-view there is air adjacent to the perineal soft tissues, it is unclear if this represents soft tissue air or simply air within adjacent soft tissue structures. Recommend correlation with physical exam for symptoms of crepitus or soft tissue infection. Air coursing adjacent to soft tissue structures is favored on reformats. Aortic Atherosclerosis (ICD10-I70.0). Electronically Signed   By: Keith Rake M.D.   On: 06/17/2021 22:24   CT ABDOMEN PELVIS W CONTRAST  Result Date: 06/17/2021 CLINICAL DATA:  Pneumonia, effusion or abscess suspected, xray done; Abdominal pain, fever Bibasilar disease on CT abdomen and pelvis. New ascites with concern for metastatic cancer with new renal mass. Laparotomy 1 month ago per electronic medical records. EXAM: CT CHEST, ABDOMEN, AND PELVIS WITH CONTRAST TECHNIQUE: Multidetector CT imaging of the chest, abdomen and pelvis was performed following the standard protocol during bolus administration of intravenous contrast. CONTRAST:  128mL OMNIPAQUE IOHEXOL 350 MG/ML SOLN COMPARISON:  Abdominal CT 6 days ago 06/11/2021. Most recent chest imaging radiograph from December of 2021. FINDINGS: CT CHEST FINDINGS Cardiovascular: Aortic atherosclerosis. No aneurysm or dissection. No central pulmonary embolus. Mild cardiomegaly with coronary artery calcifications. No pericardial effusion. Mediastinum/Nodes: No enlarged mediastinal or hilar lymph nodes. No esophageal wall thickening. No thyroid nodule.  Lungs/Pleura: Bandlike opacities throughout the lower lobes. This is slightly more confluent  on the right where there are central air bronchograms. Slightly rounded subpleural appearance on the left suggestive of round atelectasis. There is a small left pleural effusion. Occasional areas of perifissural and subpleural ground-glass opacity in the upper lobes. There is no dominant pulmonary mass. No pulmonary nodule. Trachea and central bronchi are patent. Musculoskeletal: Slight ground-glass density of the osseous structures suggesting renal osteodystrophy. No focal bone lesion or acute osseous abnormality. CT ABDOMEN PELVIS FINDINGS Hepatobiliary: Focal hepatic lesion. Slight decreased hepatic density typical of steatosis. Thick-walled gallbladder is nonspecific in the setting of ascites. Punctate gallstone, assessed with recent ultrasound. No common bile duct dilatation. Pancreas: 5 mm pancreatic ductal dilatation. This is unchanged from prior exam. No evidence of pancreatic mass. Spleen: Subtle subcentimeter low-density in the inferior spleen. Additional areas of decreased density on prior exam are no longer seen. Cyst in the superior spleen measures 18 mm, unchanged. Adrenals/Urinary Tract: No adrenal nodule. Sequela of chronic renal disease with renal parenchymal atrophy and multiple cysts. There is a 3.1 Cm more solid-appearing lesion in the anterior right kidney. No hydronephrosis. Absent renal excretion on delayed phase imaging consistent with chronic kidney disease. Urinary bladder is nondistended. Stomach/Bowel: Ingested material and pills in the stomach. Ingested material reaches the distal small bowel and proximal colon. Slightly edematous small bowel appearance of left abdominal bowel loops may be related to adjacent ascites. Enteric sutures in the right abdomen, suspected prior right hepatic colectomy. No small bowel obstruction. Small to moderate colonic stool burden. Vascular/Lymphatic: Aortic  atherosclerosis and tortuosity. Patent portal vein. No perirenal or retroperitoneal adenopathy. Abdominal or pelvic adenopathy. Reproductive: Unremarkable prostate. The lower most field-of-view series 3, image 141 there is air adjacent to the peroneal soft tissues, it is unclear if this represents soft tissue air or simply air within adjacent soft tissue structures. Other: Moderate volume abdominopelvic ascites. No convincing omental thickening. Generalized edema of the subcutaneous tissues. No free intra-abdominal air. Postsurgical change of the abdominal wall. Musculoskeletal: Slight ground-glass density of the osseous structures suggesting renal osteodystrophy. No focal bone lesion. IMPRESSION: 1. Bandlike opacities throughout the lower lobes, slightly more confluent on the right where there are central air bronchograms. Findings may represent atelectasis or infection. There is a small left pleural effusion. 2. Occasional areas of perifissural and subpleural ground-glass opacity in the upper lobes, may be infectious or inflammatory. 3. No pulmonary nodule, mass, or evidence of intrathoracic malignancy. 4. Indeterminate solid-appearing lesion in the right mid kidney measuring approximately 3.1 cm, not significantly changed from recent exam, remaining suspicious for neoplasm. No adenopathy. 5. 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 Subtle subcentimeter low-density in the inferior spleen, nonspecific. Additional areas of decreased density on prior exam are no longer seen. This may represent splenic infarcts sequela, less likely metastatic disease given findings have improved from recent prior exam. 6. Moderate volume abdominopelvic ascites. Generalized edema of the subcutaneous tissues. 7. Thick-walled gallbladder is nonspecific in the setting of ascites. Punctate gallstone, assessed with recent ultrasound. 8. Stable pancreatic ductal dilatation. 9. The lower most field-of-view there is air adjacent to the perineal soft tissues,  it is unclear if this represents soft tissue air or simply air within adjacent soft tissue structures. Recommend correlation with physical exam for symptoms of crepitus or soft tissue infection. Air coursing adjacent to soft tissue structures is favored on reformats. Aortic Atherosclerosis (ICD10-I70.0). Electronically Signed   By: Keith Rake M.D.   On: 06/17/2021 22:24   Medications:  cefTRIAXone (ROCEPHIN)  IV 2 g (06/18/21 1730)    (  feeding supplement) PROSource Plus  30 mL Oral BID BM   bisacodyl  10 mg Rectal Daily   buPROPion  150 mg Oral Daily   Chlorhexidine Gluconate Cloth  6 each Topical Q0600   darbepoetin (ARANESP) injection - DIALYSIS  40 mcg Intravenous Q Thu-HD   diltiazem  360 mg Oral Daily   enoxaparin (LOVENOX) injection  30 mg Subcutaneous Q24H   feeding supplement (NEPRO CARB STEADY)  237 mL Oral TID BM   ferric citrate  420 mg Oral TID WC   hydrALAZINE  25 mg Oral Daily   multivitamin  1 tablet Oral QHS   ondansetron (ZOFRAN) IV  4 mg Intravenous Once   pantoprazole  20 mg Oral Daily   senna  1 tablet Oral Daily    Dialysis Orders: TTS at [facility] 3:45 min 180NRe 450/Autoflow 1.5 2.0K/2.0 Ca 65 kgs  UFP 4 AVF - No heparin - Mircera 50 mcg IV q 4 weeks (Last dose 05/12/2021)   Assessment/Plan: 1. Ileus: Resolving. Diet has been advanced 2. E.Coli peritonitis: Cx 8/29 +. On Ceftriaxone. Surgery following for GB sludge/wall thickening, no surgical plans. Repeat paracentesis 9/2 with improving cell count (although still high), Cx NGTD. 2. ESRD: Continue HD per TTS schedule - next 9/6. No heparin. 3. Anemia: Hgb 8.8, continue Aranesp q Thursday. Holding IV iron for now. 4. Secondary hyperparathyroidism: Ca ok, Phos much improved - continue Auryxia as binder. 5. HTN/volume: BP high, below prior EDW. No volume excess on exam except abdomen. 6. Nutrition: Alb 1.7 (low), continue protein supplements. 7. R renal mass: Needs OP follow-up. 8. Hep C  Veneta Penton,  PA-C 06/19/2021, 11:12 AM  [employer]

## 2021-06-19 NOTE — Progress Notes (Signed)
Patient ID: Timothy [name], male   DOB: November 18, 1960, 60 y.o.   MRN: [id number] [facility] Surgery Progress Note:   * No surgery found *  Subjective: Mental status is alert but subdued.  Complaints bloating. Objective: Vital signs in last 24 hours: Temp:  [98 F (36.7 C)-98.6 F (37 C)] 98.1 F (36.7 C) (09/04 0910) Pulse Rate:  [85-90] 86 (09/04 0910) Resp:  [15-18] 18 (09/04 0416) BP: (138-189)/(72-106) 189/106 (09/04 0910) SpO2:  [95 %-98 %] 97 % (09/04 0910)  Intake/Output from previous day: 09/03 0701 - 09/04 0700 In: 717 [P.O.:717] Out: 98  Intake/Output this shift: Total I/O In: 240 [P.O.:240] Out: -   Physical Exam: Work of breathing is OK.  Eating sherbert and complaining of bloating.  Requested rocking chair.  Sitter with him.    Lab Results:  Results for orders placed or performed during the hospital encounter of 06/11/21 (from the past 48 hour(s))  Body fluid cell count with differential     Status: Abnormal   Collection Time: 06/17/21  2:45 PM  Result Value Ref Range   Fluid Type-FCT Peritoneal    Color, Fluid STRAW (A) YELLOW   Appearance, Fluid HAZY (A) CLEAR   Total Nucleated Cell Count, Fluid 1,221 (H) 0 - 1,000 cu mm   Neutrophil Count, Fluid 38 (H) 0 - 25 %   Lymphs, Fluid 53 %   Monocyte-Macrophage-Serous Fluid 9 (L) 50 - 90 %   Eos, Fluid 0 %   Other Cells, Fluid MESOTHELIAL CELLS %    Comment: Performed at [facility] [address] [place], [place] [address]  Amylase, pleural or peritoneal fluid        Status: None   Collection Time: 06/17/21  2:45 PM  Result Value Ref Range   Amylase, Fluid 71 U/L    Comment: NO NORMAL RANGE ESTABLISHED FOR THIS TEST Performed at [facility], [address] [place], [place] [address]    Fluid Type-FAMY Peritoneal     Comment: Performed at [facility], [place] [address], [place], [place] [address]  Lactate dehydrogenase (pleural or peritoneal fluid)     Status: Abnormal   Collection  Time: 06/17/21  2:45 PM  Result Value Ref Range   LD, Fluid 107 (H) 3 - 23 U/L    Comment: (NOTE) Results should be evaluated in conjunction with serum values    Fluid Type-FLDH PERITONEAL CAVITY     Comment: Performed at [facility] [address] [place], [place] [address]  Glucose, pleural or peritoneal fluid     Status: None   Collection Time: 06/17/21  2:45 PM  Result Value Ref Range   Glucose, Fluid 95 mg/dL    Comment: (NOTE) No normal range established for this test Results should be evaluated in conjunction with serum values    Fluid Type-FGLU PERITONEAL CAVITY     Comment: Performed at [facility] [address], [place], [place] [address]  Protein, pleural or peritoneal fluid     Status: None   Collection Time: 06/17/21  2:45 PM  Result Value Ref Range   Total protein, fluid 3.8 g/dL    Comment: (NOTE) No normal range established for this test Results should be evaluated in conjunction with serum values    Fluid Type-FTP Peritoneal     Comment: Performed at [facility], [place] [address] [place], [place] [address]  Albumin, pleural or peritoneal fluid  Status: None   Collection Time: 06/17/21  2:45 PM  Result Value Ref Range   Albumin, Fluid 1.4 g/dL    Comment: (NOTE) No normal range established for this test Results should be evaluated in conjunction with serum values    Fluid Type-FALB Peritoneal     Comment: Performed at [facility], [place] [address] [place], [place] [address]  Culture, body fluid w Gram Stain-bottle     Status: None (Preliminary result)   Collection Time: 06/17/21  2:45 PM   Specimen: Fluid  Result Value Ref Range   Specimen Description FLUID PERITONEAL    Special Requests BOTTLES DRAWN AEROBIC AND ANAEROBIC    Culture      NO GROWTH 2 DAYS Performed at [facility], [place] [address] [place], [place] [address]    Report Status PENDING   Gram stain     Status: None   Collection Time: 06/17/21  2:45  PM   Specimen: Fluid  Result Value Ref Range   Specimen Description FLUID PERITONEAL    Special Requests NONE    Gram Stain      ABUNDANT WBC PRESENT, PREDOMINANTLY MONONUCLEAR NO ORGANISMS SEEN Performed at [facility], [place] [address], [place], [place] [address]    Report Status 06/18/2021 FINAL   Protein, total     Status: Abnormal   Collection Time: 06/17/21  5:38 PM  Result Value Ref Range   Total Protein 5.6 (L) 6.5 - 8.1 g/dL    Comment: Performed at [facility], [place] [address], [place], [place] [address]  Renal function panel     Status: Abnormal   Collection Time: 06/18/21  7:39 AM  Result Value Ref Range   Sodium 131 (L) 135 - 145 mmol/L   Potassium 3.8 3.5 - 5.1 mmol/L   Chloride 94 (L) 98 - 111 mmol/L   CO2 26 22 - 32 mmol/L   Glucose, Bld 118 (H) 70 - 99 mg/dL    Comment: Glucose reference range applies only to samples taken after fasting for at least 8 hours.   BUN 63 (H) 6 - 20 mg/dL   Creatinine, Ser 6.12 (H) 0.61 - 1.24 mg/dL   Calcium 8.0 (L) 8.9 - 10.3 mg/dL   Phosphorus 3.6 2.5 - 4.6 mg/dL   Albumin 1.7 (L) 3.5 - 5.0 g/dL   GFR, Estimated 10 (L) >60 mL/min    Comment: (NOTE) Calculated using the CKD-EPI Creatinine Equation (2021)    Anion gap 11 5 - 15    Comment: Performed at [facility] [address] [place], [place] [address]  CBC     Status: Abnormal   Collection Time: 06/18/21  7:39 AM  Result Value Ref Range   WBC 11.5 (H) 4.0 - 10.5 K/uL   RBC 3.04 (L) 4.22 - 5.81 MIL/uL   Hemoglobin 8.8 (L) 13.0 - 17.0 g/dL   HCT 26.4 (L) 39.0 - 52.0 %   MCV 86.8 80.0 - 100.0 fL   MCH 28.9 26.0 - 34.0 pg   MCHC 33.3 30.0 - 36.0 g/dL   RDW 14.2 11.5 - 15.5 %   Platelets 381 150 - 400 K/uL   nRBC 0.0 0.0 - 0.2 %    Comment: Performed at [facility], [place] [address], [place],  [address]    Radiology/Results: CT CHEST W CONTRAST  Result Date: 06/17/2021 CLINICAL DATA:  Pneumonia, effusion or abscess suspected, xray done;  Abdominal pain, fever Bibasilar  disease on CT abdomen and pelvis. New ascites with concern for metastatic cancer with new renal mass. Laparotomy 1 month ago per electronic medical records. EXAM: CT CHEST, ABDOMEN, AND PELVIS WITH CONTRAST TECHNIQUE: Multidetector CT imaging of the chest, abdomen and pelvis was performed following the standard protocol during bolus administration of intravenous contrast. CONTRAST:  164mL OMNIPAQUE IOHEXOL 350 MG/ML SOLN COMPARISON:  Abdominal CT 6 days ago 06/11/2021. Most recent chest imaging radiograph from December of 2021. FINDINGS: CT CHEST FINDINGS Cardiovascular: Aortic atherosclerosis. No aneurysm or dissection. No central pulmonary embolus. Mild cardiomegaly with coronary artery calcifications. No pericardial effusion. Mediastinum/Nodes: No enlarged mediastinal or hilar lymph nodes. No esophageal wall thickening. No thyroid nodule. Lungs/Pleura: Bandlike opacities throughout the lower lobes. This is slightly more confluent on the right where there are central air bronchograms. Slightly rounded subpleural appearance on the left suggestive of round atelectasis. There is a small left pleural effusion. Occasional areas of perifissural and subpleural ground-glass opacity in the upper lobes. There is no dominant pulmonary mass. No pulmonary nodule. Trachea and central bronchi are patent. Musculoskeletal: Slight ground-glass density of the osseous structures suggesting renal osteodystrophy. No focal bone lesion or acute osseous abnormality. CT ABDOMEN PELVIS FINDINGS Hepatobiliary: Focal hepatic lesion. Slight decreased hepatic density typical of steatosis. Thick-walled gallbladder is nonspecific in the setting of ascites. Punctate gallstone, assessed with recent ultrasound. No common bile duct dilatation. Pancreas: 5 mm pancreatic ductal dilatation. This is unchanged from prior exam. No evidence of pancreatic mass. Spleen: Subtle subcentimeter low-density in the inferior spleen.  Additional areas of decreased density on prior exam are no longer seen. Cyst in the superior spleen measures 18 mm, unchanged. Adrenals/Urinary Tract: No adrenal nodule. Sequela of chronic renal disease with renal parenchymal atrophy and multiple cysts. There is a 3.1 Cm more solid-appearing lesion in the anterior right kidney. No hydronephrosis. Absent renal excretion on delayed phase imaging consistent with chronic kidney disease. Urinary bladder is nondistended. Stomach/Bowel: Ingested material and pills in the stomach. Ingested material reaches the distal small bowel and proximal colon. Slightly edematous small bowel appearance of left abdominal bowel loops may be related to adjacent ascites. Enteric sutures in the right abdomen, suspected prior right hepatic colectomy. No small bowel obstruction. Small to moderate colonic stool burden. Vascular/Lymphatic: Aortic atherosclerosis and tortuosity. Patent portal vein. No perirenal or retroperitoneal adenopathy. Abdominal or pelvic adenopathy. Reproductive: Unremarkable prostate. The lower most field-of-view series 3, image 141 there is air adjacent to the peroneal soft tissues, it is unclear if this represents soft tissue air or simply air within adjacent soft tissue structures. Other: Moderate volume abdominopelvic ascites. No convincing omental thickening. Generalized edema of the subcutaneous tissues. No free intra-abdominal air. Postsurgical change of the abdominal wall. Musculoskeletal: Slight ground-glass density of the osseous structures suggesting renal osteodystrophy. No focal bone lesion. IMPRESSION: 1. Bandlike opacities throughout the lower lobes, slightly more confluent on the right where there are central air bronchograms. Findings may represent atelectasis or infection. There is a small left pleural effusion. 2. Occasional areas of perifissural and subpleural ground-glass opacity in the upper lobes, may be infectious or inflammatory. 3. No pulmonary  nodule, mass, or evidence of intrathoracic malignancy. 4. Indeterminate solid-appearing lesion in the right mid kidney measuring approximately 3.1 cm, not significantly changed from recent exam, remaining suspicious for neoplasm. No adenopathy. 5. 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 Subtle subcentimeter low-density in the inferior spleen, nonspecific. Additional areas of decreased density on prior exam are no longer seen. This may represent splenic infarcts  sequela, less likely metastatic disease given findings have improved from recent prior exam. 6. Moderate volume abdominopelvic ascites. Generalized edema of the subcutaneous tissues. 7. Thick-walled gallbladder is nonspecific in the setting of ascites. Punctate gallstone, assessed with recent ultrasound. 8. Stable pancreatic ductal dilatation. 9. The lower most field-of-view there is air adjacent to the perineal soft tissues, it is unclear if this represents soft tissue air or simply air within adjacent soft tissue structures. Recommend correlation with physical exam for symptoms of crepitus or soft tissue infection. Air coursing adjacent to soft tissue structures is favored on reformats. Aortic Atherosclerosis (ICD10-I70.0). Electronically Signed   By: Keith Rake M.D.   On: 06/17/2021 22:24   CT ABDOMEN PELVIS W CONTRAST  Result Date: 06/17/2021 CLINICAL DATA:  Pneumonia, effusion or abscess suspected, xray done; Abdominal pain, fever Bibasilar disease on CT abdomen and pelvis. New ascites with concern for metastatic cancer with new renal mass. Laparotomy 1 month ago per electronic medical records. EXAM: CT CHEST, ABDOMEN, AND PELVIS WITH CONTRAST TECHNIQUE: Multidetector CT imaging of the chest, abdomen and pelvis was performed following the standard protocol during bolus administration of intravenous contrast. CONTRAST:  164mL OMNIPAQUE IOHEXOL 350 MG/ML SOLN COMPARISON:  Abdominal CT 6 days ago 06/11/2021. Most recent chest imaging radiograph from December of  2021. FINDINGS: CT CHEST FINDINGS Cardiovascular: Aortic atherosclerosis. No aneurysm or dissection. No central pulmonary embolus. Mild cardiomegaly with coronary artery calcifications. No pericardial effusion. Mediastinum/Nodes: No enlarged mediastinal or hilar lymph nodes. No esophageal wall thickening. No thyroid nodule. Lungs/Pleura: Bandlike opacities throughout the lower lobes. This is slightly more confluent on the right where there are central air bronchograms. Slightly rounded subpleural appearance on the left suggestive of round atelectasis. There is a small left pleural effusion. Occasional areas of perifissural and subpleural ground-glass opacity in the upper lobes. There is no dominant pulmonary mass. No pulmonary nodule. Trachea and central bronchi are patent. Musculoskeletal: Slight ground-glass density of the osseous structures suggesting renal osteodystrophy. No focal bone lesion or acute osseous abnormality. CT ABDOMEN PELVIS FINDINGS Hepatobiliary: Focal hepatic lesion. Slight decreased hepatic density typical of steatosis. Thick-walled gallbladder is nonspecific in the setting of ascites. Punctate gallstone, assessed with recent ultrasound. No common bile duct dilatation. Pancreas: 5 mm pancreatic ductal dilatation. This is unchanged from prior exam. No evidence of pancreatic mass. Spleen: Subtle subcentimeter low-density in the inferior spleen. Additional areas of decreased density on prior exam are no longer seen. Cyst in the superior spleen measures 18 mm, unchanged. Adrenals/Urinary Tract: No adrenal nodule. Sequela of chronic renal disease with renal parenchymal atrophy and multiple cysts. There is a 3.1 Cm more solid-appearing lesion in the anterior right kidney. No hydronephrosis. Absent renal excretion on delayed phase imaging consistent with chronic kidney disease. Urinary bladder is nondistended. Stomach/Bowel: Ingested material and pills in the stomach. Ingested material reaches the  distal small bowel and proximal colon. Slightly edematous small bowel appearance of left abdominal bowel loops may be related to adjacent ascites. Enteric sutures in the right abdomen, suspected prior right hepatic colectomy. No small bowel obstruction. Small to moderate colonic stool burden. Vascular/Lymphatic: Aortic atherosclerosis and tortuosity. Patent portal vein. No perirenal or retroperitoneal adenopathy. Abdominal or pelvic adenopathy. Reproductive: Unremarkable prostate. The lower most field-of-view series 3, image 141 there is air adjacent to the peroneal soft tissues, it is unclear if this represents soft tissue air or simply air within adjacent soft tissue structures. Other: Moderate volume abdominopelvic ascites. No convincing omental thickening. Generalized edema of the subcutaneous  tissues. No free intra-abdominal air. Postsurgical change of the abdominal wall. Musculoskeletal: Slight ground-glass density of the osseous structures suggesting renal osteodystrophy. No focal bone lesion. IMPRESSION: 1. Bandlike opacities throughout the lower lobes, slightly more confluent on the right where there are central air bronchograms. Findings may represent atelectasis or infection. There is a small left pleural effusion. 2. Occasional areas of perifissural and subpleural ground-glass opacity in the upper lobes, may be infectious or inflammatory. 3. No pulmonary nodule, mass, or evidence of intrathoracic malignancy. 4. Indeterminate solid-appearing lesion in the right mid kidney measuring approximately 3.1 cm, not significantly changed from recent exam, remaining suspicious for neoplasm. No adenopathy. 5. 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 Subtle subcentimeter low-density in the inferior spleen, nonspecific. Additional areas of decreased density on prior exam are no longer seen. This may represent splenic infarcts sequela, less likely metastatic disease given findings have improved from recent prior exam. 6. Moderate  volume abdominopelvic ascites. Generalized edema of the subcutaneous tissues. 7. Thick-walled gallbladder is nonspecific in the setting of ascites. Punctate gallstone, assessed with recent ultrasound. 8. Stable pancreatic ductal dilatation. 9. The lower most field-of-view there is air adjacent to the perineal soft tissues, it is unclear if this represents soft tissue air or simply air within adjacent soft tissue structures. Recommend correlation with physical exam for symptoms of crepitus or soft tissue infection. Air coursing adjacent to soft tissue structures is favored on reformats. Aortic Atherosclerosis (ICD10-I70.0). Electronically Signed   By: Keith Rake M.D.   On: 06/17/2021 22:24    Anti-infectives: Anti-infectives (From admission, onward)    Start     Dose/Rate Route Frequency Ordered Stop   06/16/21 2000  cefoTAXime (CLAFORAN) 2 g in dextrose 5 % 50 mL IVPB  Status:  Discontinued        2 g 140 mL/hr over 30 Minutes Intravenous Every 24 hours 06/15/21 1344 06/16/21 0858   06/16/21 1700  cefTRIAXone (ROCEPHIN) 2 g in sodium chloride 0.9 % 100 mL IVPB        2 g 200 mL/hr over 30 Minutes Intravenous Every 24 hours 06/16/21 0858     06/15/21 1430  cefoTAXime (CLAFORAN) 2 g in dextrose 5 % 50 mL IVPB        2 g 140 mL/hr over 30 Minutes Intravenous  Once 06/15/21 1344 06/15/21 1749       Assessment/Plan: Problem List: Patient Active Problem List   Diagnosis Date Noted   Major depressive disorder, single episode, severe ([place]) 06/18/2021   Bacterial peritonitis ([place])    Other ascites    Protein-calorie malnutrition, severe 06/13/2021   Ileus ([place]) 06/11/2021   Small bowel obstruction ([place]) 05/20/2021   DOE (dyspnea on exertion) 10/25/2020   Orthopnea 10/25/2020   Open wound of left hand 01/18/2018   Tibial fracture 01/14/2018   Tibial plateau fracture, left 01/13/2018   Malnutrition of moderate degree 09/20/2016   AKI (acute kidney injury) ([place])    History of colon  cancer    Hyponatremia    Leukocytosis    Acute blood loss anemia    Acute metabolic encephalopathy 86/57/8469   ESRD on dialysis ([place]) 09/09/2016   Hypertensive emergency 09/09/2016   Acute respiratory failure with hypoxia ([place]) 09/09/2016   Acute pulmonary edema ([place]) 09/09/2016   Polysubstance abuse ([place]) 09/09/2016   Nonadherence to medical treatment 09/09/2016   Anemia due to chronic kidney disease 09/09/2016   Altered mental status 09/09/2016   Acute respiratory failure ([facility])    Drug ingestion  Hypertensive heart and chronic kidney disease with heart failure and with stage 5 chronic kidney disease, or end stage renal disease ([place]) 07/19/2016   Chest pain    Elevated troponin 32/41/9914   Acute diastolic heart failure, NYHA class 2 ([place]) 07/17/2016   Heroin abuse ([place]) 07/17/2016   HTN (hypertension) 07/17/2016   Benign prostate hyperplasia 07/16/2015    HD yesterday.  Bloating with mild distention.  Continue present treament.   * No surgery found *    LOS: 7 days   Matt B. Hassell Done, MD, [facility] [phone] to reach the surgeon on call.    06/19/2021 9:46 AM

## 2021-06-19 NOTE — Progress Notes (Signed)
Subjective: Patient continues to have abdominal pain after eating states it is mostly in the center of his abdomen where his midline incision is. He feels his abdomen is beginning to fill up with fluid. He denies any nausea or vomiting. He has had a small bowel movement.   Objective:  Vital signs in last 24 hours: Vitals:   06/18/21 1719 06/18/21 2213 06/19/21 0416 06/19/21 0910  BP: (!) 158/75 (!) 145/99 138/72 (!) 189/106  Pulse: 85 85 85 86  Resp: 16 18 18    Temp: 98 F (36.7 C) 98.5 F (36.9 C) 98.5 F (36.9 C) 98.1 F (36.7 C)  TempSrc: Oral Oral Oral Oral  SpO2: 98% 95% 95% 97%  Weight:      Height:       Physical Exam General: alert, appears stated age, in no acute distress Abdomen: Soft, distended, bowel sounds present, no tenderness to palpation, +shifting dullness on percussion  Pulm: Non labored breathing on room air, no wheezing or crackles.  Skin: Warm and dry Psych: suicidal ideations present, denies an active plan, normal affect  Assessment/Plan:  Principal Problem:   Major depressive disorder, single episode, severe ([place]) Active Problems:   Ileus ([place])   Protein-calorie malnutrition, severe   Bacterial peritonitis ([place])   Other ascites  Timothy [name] is a 60 yo M with PMHx of ESRD on HD TTS, colon cancer s/p resection, HFpEF, HTN, and history of polysubstance use who was admitted for ileus and found to have bacterial peritonitis.   Bacterial peritonitis  Ileus, resolved S/p a recent exploratory laparotomy with lysis of adhesions on 05/23/2021. Adhesions likely due to bowel resection for colon cancer in 2014. [facility] for ongoing abdominal pain and ileus. His Ileus has since resolved but he was found to have ascites, now s/p paracentesis x2. After initial paracentesis he was started on IV antibiotics for possible secondary bacterial peritonitis. Peritoneal fluid culture did grow E Coli. Paracentesis 9/2 (diagnostic and therapeutic) with  2.4L drained and fluid analysis showed no organisms on gram stain and ascitic fluid cultures with NG at 12 hours. Neutrophil count was elevated but decreased from prior.  Other notable fluid studies include normal total protein, albumin and glucose, increased LDH. Cytology is pending. SAAG is 0.3. He also had a repeat CT abdomen/pelvis yesterday which showed moderate volume ascites, thick walled gallbladder and  questionable air in the perineal soft tissues.   Patient likely has secondary bacterial peritonitis though the source of his infection remains unclear. On IV antibiotics patient's ascitic fluid is improved. His recurrent ascites will likely resolve once the peritonitis in his abdomen resolves. Also considered malignant ascites though less likely as previous splenic hypo densities c/f metastatic disease no longer seen on recent CT and he had no other evidence metastatic disease on recent imaging. General surgery on following, and no acute surgical intervention warranted at this time.  - Appreciate General surgery and GI recommendations - Continue ceftriaxone  - Follow fluid culture and cytology - Pain control with oral oxycodone-acetaminophen 5-325 mg q6h and IV dilaudid 1 mg q3h prn for severe breakthrough pain  Suicidal ideations  Patient reported suicidal ideations and plan 9/2. He continues to note feeling depressed due to his persistent abdominal pain and financial issues regarding his social security. He is still having SI this morning but denies a plan.  - Psychiatry was consulted and started patient on wellbutrin 150mg   - Suicide precautions + sitter  ESRD on HD  HD  9/3 however patient felt HD was contributing to his abdominal pain and requested it be stopped. Only ~100cc of UF was removed.  Labs obtained at that time with no significant electrolyte abnormality except for patient's persistently elevated BUN. On his T/Th/Sat schedule.  - Nephrology on board, appreciate recommendations    HTN Blood pressures have been fluctuating, will continue to monitor after HD. Unfortunately, only able to obtain his BP readings on his distal RUE which is making it difficult to know if we are getting an appropriate assessment.  - Continue diltiazem 360 mg daily - Started on oral hydralazine 25 mg daily, may need to up titrate to therapeutic dosing    Renal mass  Image findings suspicious for renal cell carcinoma. Previously described splenic hypodensities no longer appreciated on repeat imaging, thus less likely malignant. CT chest negative for any metastatic lesions. Urology consulted and recommended MRI, this will be done 9/5 or 9/6 prior to HD.  - F/u urology recommendations.   Prior to Admission Living Arrangement: Home Anticipated Discharge Location: Home Barriers to Discharge: Clinical improvement Dispo: Anticipated discharge pending clinical improvement.   Rick Duff, MD 06/19/2021, 11:15 AM Pager: [phone] After 5pm on weekdays and 1pm on weekends: On Call pager [phone]

## 2021-06-20 ENCOUNTER — Inpatient Hospital Stay (HOSPITAL_COMMUNITY): Payer: Medicare HMO

## 2021-06-20 DIAGNOSIS — F322 Major depressive disorder, single episode, severe without psychotic features: Secondary | ICD-10-CM

## 2021-06-20 DIAGNOSIS — E43 Unspecified severe protein-calorie malnutrition: Secondary | ICD-10-CM | POA: Diagnosis not present

## 2021-06-20 DIAGNOSIS — I350 Nonrheumatic aortic (valve) stenosis: Secondary | ICD-10-CM | POA: Diagnosis not present

## 2021-06-20 DIAGNOSIS — N2889 Other specified disorders of kidney and ureter: Secondary | ICD-10-CM

## 2021-06-20 DIAGNOSIS — R188 Other ascites: Secondary | ICD-10-CM | POA: Diagnosis not present

## 2021-06-20 DIAGNOSIS — K659 Peritonitis, unspecified: Secondary | ICD-10-CM | POA: Diagnosis not present

## 2021-06-20 LAB — ECHOCARDIOGRAM COMPLETE
AR max vel: 1.74 cm2
AV Area VTI: 1.79 cm2
AV Area mean vel: 1.67 cm2
AV Mean grad: 10 mmHg
AV Peak grad: 19.7 mmHg
Ao pk vel: 2.22 m/s
Area-P 1/2: 3.85 cm2
Height: 73 in
MV VTI: 2.36 cm2
P 1/2 time: 334 msec
S' Lateral: 3.3 cm
Weight: 2345.69 oz

## 2021-06-20 MED ORDER — HYDROMORPHONE HCL 2 MG PO TABS
2.0000 mg | ORAL_TABLET | ORAL | Status: DC | PRN
  Administered 2021-06-20: 2 mg via ORAL
  Filled 2021-06-20: qty 1

## 2021-06-20 MED ORDER — HYDROMORPHONE HCL 1 MG/ML IJ SOLN: 1.0000 mg | Freq: Once | INTRAMUSCULAR | Status: DC

## 2021-06-20 MED ORDER — OXYCODONE-ACETAMINOPHEN 5-325 MG PO TABS: 2.0000 | ORAL_TABLET | Freq: Four times a day (QID) | ORAL | Status: DC | PRN

## 2021-06-20 MED ORDER — HYDROMORPHONE HCL 1 MG/ML IJ SOLN
1.0000 mg | Freq: Once | INTRAMUSCULAR | Status: AC
  Administered 2021-06-20: 1 mg via INTRAVENOUS
  Filled 2021-06-20: qty 1

## 2021-06-20 MED ORDER — LIDOCAINE 5 % EX PTCH
1.0000 | MEDICATED_PATCH | CUTANEOUS | Status: DC
  Administered 2021-06-20 – 2021-06-25 (×5): 1 via TRANSDERMAL
  Filled 2021-06-20 (×6): qty 1

## 2021-06-20 MED ORDER — HYDROMORPHONE HCL 2 MG PO TABS
2.0000 mg | ORAL_TABLET | ORAL | Status: DC | PRN
  Administered 2021-06-20 – 2021-06-25 (×21): 2 mg via ORAL
  Filled 2021-06-20 (×21): qty 1

## 2021-06-20 MED ORDER — HYDROMORPHONE HCL 1 MG/ML IJ SOLN
0.5000 mg | INTRAMUSCULAR | Status: DC | PRN
  Administered 2021-06-20 – 2021-06-25 (×22): 0.5 mg via INTRAVENOUS
  Filled 2021-06-20 (×22): qty 0.5

## 2021-06-20 MED ORDER — POLYETHYLENE GLYCOL 3350 17 G PO PACK
17.0000 g | PACK | Freq: Every day | ORAL | Status: DC
  Administered 2021-06-21 – 2021-06-22 (×2): 17 g via ORAL
  Filled 2021-06-20 (×3): qty 1

## 2021-06-20 MED ORDER — BISACODYL 10 MG RE SUPP
10.0000 mg | Freq: Every day | RECTAL | Status: DC
  Administered 2021-06-24: 10 mg via RECTAL
  Filled 2021-06-20 (×2): qty 1

## 2021-06-20 NOTE — Progress Notes (Signed)
  Echocardiogram 2D Echocardiogram has been performed.  Timothy [name] 06/20/2021, 3:03 PM

## 2021-06-20 NOTE — Plan of Care (Signed)
  Problem: Clinical Measurements: Goal: Respiratory complications will improve Outcome: Progressing   Problem: Clinical Measurements: Goal: Cardiovascular complication will be avoided Outcome: Progressing   Problem: Nutrition: Goal: Adequate nutrition will be maintained Outcome: Progressing   Problem: Coping: Goal: Level of anxiety will decrease Outcome: Progressing   Problem: Elimination: Goal: Will not experience complications related to bowel motility Outcome: Progressing   Problem: Pain Managment: Goal: General experience of comfort will improve Outcome: Progressing   Problem: Skin Integrity: Goal: Risk for impaired skin integrity will decrease Outcome: Progressing

## 2021-06-20 NOTE — Consult Note (Signed)
[facility] Consult   Reason for Consult: Suicidal ideation Referring Physician: Internal medicine Patient Identification: Timothy [name] MRN:  [id number] Principal Diagnosis: Major depressive disorder, single episode, severe ([place]) Diagnosis:  Principal Problem:   Major depressive disorder, single episode, severe ([place]) Active Problems:   Ileus ([place])   Protein-calorie malnutrition, severe   Bacterial peritonitis ([place])   Other ascites   Renal mass   Total Time spent with patient: 15 minutes  Subjective:   Timothy [name] is a 60 y.o. male patient was seen and evaluated face-to-face.  He is awake, alert and oriented x3.  Continues to endorse suicidal ideation with a plan to cut himself open.  "  This is too much pain, I will just gut myself if I have to live with this pain."  States going to dialysis 3 times a week, worsening depression and restless nights has caused his suicidal thoughts and plans to worsen.  Reported previous inpatient admission due to depression and illicit drug use.  Denied suicide attempts in the past.  Denies auditory or visual hallucinations.  Patient was initiated on Wellbutrin 150 mg p.o. daily by attending psychiatrist Akintayo on 9/3.  He reports taking and tolerating medications well.  Denying any medication side effects.  Discussed psychiatry will follow-up for mood improvement/stabilization.  Patient was receptive to plan  HPI: Per admission assessment note "Timothy [name] is a 60 y.o. male with multiple medical comorbidities and an incidentally identified indeterminate right renal mass with features concerning for solid neoplasm.  Currently being treated for ileus.  History of ESRD and currently on hemodialysis.  He has a complex surgical history he is status post colectomy in 2014 as well as ex lap on 05/23/2021 with lysis of adhesions.  No personal/family history of GU malignancies.  He denies any recent episodes of gross hematuria.  He previously  smoked approximately half a pack of cigarettes for several years, but states that he quit approximately 3 years ago.  He currently smokes marijuana almost on a daily basis and does heroin periodically."  Past Psychiatric History:   Risk to Self:   Risk to Others:   Prior Inpatient Therapy:   Prior Outpatient Therapy:    Past Medical History:  Past Medical History:  Diagnosis Date   Anemia of chronic kidney failure    BPH (benign prostatic hyperplasia)    Colon cancer ([place]) 2014   End stage renal disease on dialysis ([place]) 2017   Hypertension    Hypertensive heart disease with chronic diastolic congestive heart failure ([place]) 07/17/2016   Polysubstance abuse ([place])    History of heroin and marijuana use    Past Surgical History:  Procedure Laterality Date   ABDOMINAL SURGERY     AV FISTULA PLACEMENT Left 09/19/2016   Procedure: Left arm Radiocephalic ARTERIOVENOUS (AV) FISTULA CREATION;  Surgeon: Conrad Elmwood Place, MD;  Location: [place];  Service: Vascular;  Laterality: Left;   [place] Left 07/09/2017   Procedure: BRACHIOCEPHALIC FISTULA CREATION;  Surgeon: Conrad Freeman Spur, MD;  Location: [place];  Service: Vascular;  Laterality: Left;   COLON SURGERY  2014   INSERTION OF DIALYSIS CATHETER N/A 09/19/2016   Procedure: INSERTION OF TUNNELED DIALYSIS CATHETER;  Surgeon: Conrad Point Place, MD;  Location: [facility];  Service: Vascular;  Laterality: N/A;   IR GENERIC HISTORICAL  09/14/2016   IR US GUIDE VASC ACCESS RIGHT 09/14/2016 [name], DO [facility]   IR GENERIC HISTORICAL  09/14/2016   IR FLUORO GUIDE CV  LINE RIGHT 09/14/2016 [name], DO [facility]   LAPAROTOMY N/A 05/23/2021   Procedure: EXPLORATORY LAPAROTOMY LYSIS ADHESIONS;  Surgeon: [name], MD;  Location: [facility];  Service: General;  Laterality: N/A;   ORIF TIBIA PLATEAU Left 01/15/2018   Procedure: OPEN REDUCTION INTERNAL FIXATION (ORIF) TIBIAL PLATEAU;  Surgeon: Altamese Portsmouth, MD;  Location: [place];   Service: Orthopedics;  Laterality: Left;   TRANSTHORACIC ECHOCARDIOGRAM  07/2016    EF 60-65%, No RWMA. Mod Concentric LVH - Gr 2 DD. Severe LA dilation. PAP ~35 mmHg (mild Pulm HTN)  --> no changes noted 1 month later   Family History:  Family History  Problem Relation Age of Onset   Heart failure Mother        Died at age 74.   Heart attack Mother 30   Hypertension Mother    Diabetes Mellitus II Mother    Other Father        Unknown   Kidney failure Sister        (Oldest Sister)   Other Other        Multiple siblings have started her heart disease, he is not sure of the details.   CAD Nephew    Family Psychiatric  History:  Social History:  Social History   Substance and Sexual Activity  Alcohol Use No   Alcohol/week: 7.0 standard drinks   Types: 7 Cans of beer per week     Social History   Substance and Sexual Activity  Drug Use Yes   Frequency: 1.0 times per week   Types: Marijuana, Heroin   Comment: snorts heroin, takes pain pills that he buys off the street    Social History   Socioeconomic History   Marital status: Married    Spouse name: Not on file   Number of children: Not on file   Years of education: Not on file   Highest education level: Not on file  Occupational History   Not on file  Tobacco Use   Smoking status: Former    Packs/day: 0.25    Types: Cigarettes    Quit date: 07/06/2017    Years since quitting: 3.9   Smokeless tobacco: Never  Vaping Use   Vaping Use: Never used  Substance and Sexual Activity   Alcohol use: No    Alcohol/week: 7.0 standard drinks    Types: 7 Cans of beer per week   Drug use: Yes    Frequency: 1.0 times per week    Types: Marijuana, Heroin    Comment: snorts heroin, takes pain pills that he buys off the street   Sexual activity: Not on file    Comment: once a week  Other Topics Concern   Not on file  Social History Narrative   Not on file   Social Determinants of Health   Financial Resource Strain: Not  on file  Food Insecurity: Not on file  Transportation Needs: Not on file  Physical Activity: Not on file  Stress: Not on file  Social Connections: Not on file   Additional Social History:    Allergies:   Allergies  Allergen Reactions   No Known Allergies     Labs: No results found for this or any previous visit (from the past 48 hour(s)).  Current Facility-Administered Medications  Medication Dose Route Frequency Provider Last Rate Last Admin   (feeding supplement) PROSource Plus liquid 30 mL  30 mL Oral BID BM Katsadouros, Vasilios, MD   30 mL at 06/15/21  4098   bisacodyl (DULCOLAX) suppository 10 mg  10 mg Rectal Daily Jesusita Oka, MD   10 mg at 06/20/21 0849   buPROPion (WELLBUTRIN XL) 24 hr tablet 150 mg  150 mg Oral Daily Akintayo, Mojeed, MD   150 mg at 06/20/21 0848   cefTRIAXone (ROCEPHIN) 2 g in sodium chloride 0.9 % 100 mL IVPB  2 g Intravenous Q24H Wayland Denis, MD 200 mL/hr at 06/19/21 1632 2 g at 06/19/21 1632   Chlorhexidine Gluconate Cloth 2 % PADS 6 each  6 each Topical Q0600 Valentina Gu, NP   6 each at 06/19/21 0604   Darbepoetin Alfa (ARANESP) injection 40 mcg  40 mcg Intravenous Q Thu-HD Ernest Haber, PA-C   40 mcg at 06/16/21 1540   diltiazem (CARDIZEM CD) 24 hr capsule 360 mg  360 mg Oral Daily Virl Axe, MD   360 mg at 06/20/21 0846   enoxaparin (LOVENOX) injection 30 mg  30 mg Subcutaneous Q24H Katsadouros, Vasilios, MD   30 mg at 06/20/21 0847   feeding supplement (NEPRO CARB STEADY) liquid 237 mL  237 mL Oral TID BM Ernest Haber, PA-C   237 mL at 06/19/21 1357   ferric citrate (AURYXIA) tablet 420 mg  420 mg Oral TID WC Ernest Haber, PA-C   420 mg at 06/20/21 1241   hydrALAZINE (APRESOLINE) injection 10 mg  10 mg Intravenous Q4H PRN Katsadouros, Vasilios, MD   10 mg at 06/16/21 2122   hydrALAZINE (APRESOLINE) tablet 25 mg  25 mg Oral Daily Rick Duff, MD   25 mg at 06/20/21 0848   HYDROmorphone (DILAUDID) injection 0.5 mg  0.5  mg Intravenous Q3H PRN Rick Duff, MD       HYDROmorphone (DILAUDID) tablet 2 mg  2 mg Oral Q4H PRN Rick Duff, MD       lidocaine (LIDODERM) 5 % 1 patch  1 patch Transdermal Q24H Rick Duff, MD   1 patch at 06/20/21 1103   multivitamin (RENA-VIT) tablet 1 tablet  1 tablet Oral QHS Katsadouros, Vasilios, MD   1 tablet at 06/19/21 2034   ondansetron (ZOFRAN) injection 4 mg  4 mg Intravenous Once Rick Duff, MD       pantoprazole (PROTONIX) EC tablet 20 mg  20 mg Oral Daily Farrel Gordon, DO   20 mg at 06/20/21 0848   polyethylene glycol (MIRALAX / GLYCOLAX) packet 17 g  17 g Oral Daily PRN Rick Duff, MD   17 g at 06/19/21 0908   senna (SENOKOT) tablet 8.6 mg  1 tablet Oral Daily Rick Duff, MD   8.6 mg at 06/20/21 0848    Musculoskeletal: Strength & Muscle Tone: within normal limits Gait & Station: normal Patient leans: N/A            Psychiatric Specialty Exam:  Presentation  General Appearance: Fairly Groomed  Eye Contact:Fair  Speech:Clear and Coherent  Speech Volume:Decreased  Handedness:Right   Mood and Affect  Mood:Depressed; Hopeless  Affect:Constricted   Thought Process  Thought Processes:Coherent; Linear  Descriptions of Associations:Intact  Orientation:Full (Time, Place and Person)  Thought Content:Logical  History of Schizophrenia/Schizoaffective disorder:No data recorded Duration of Psychotic Symptoms:No data recorded Hallucinations:No data recorded Ideas of Reference:None  Suicidal Thoughts:No data recorded Homicidal Thoughts:No data recorded  Sensorium  Memory:Immediate Good; Recent Good; Remote Good  Judgment:Fair  Insight:Fair   Executive Functions  Concentration:Fair  Attention Span:Fair  [place]  Language:Good   Psychomotor Activity  Psychomotor Activity: No data recorded  Assets  Assets:Communication Skills; Desire for Improvement   Sleep   Sleep: No data recorded  Physical Exam: Physical Exam Vitals and nursing note reviewed.  Cardiovascular:     Rate and Rhythm: Normal rate.  Neurological:     Mental Status: He is alert.  Psychiatric:        Mood and Affect: Mood normal.        Thought Content: Thought content normal.   Review of Systems  Psychiatric/Behavioral:  Positive for depression and suicidal ideas. The patient is nervous/anxious.   Blood pressure (!) 155/83, pulse 85, temperature 98.3 F (36.8 C), temperature source Oral, resp. rate 18, height 6\' 1"  (1.854 m), weight 66.5 kg, SpO2 100 %. Body mass index is 19.34 kg/m.  Treatment Plan Summary: Daily contact with patient to assess and evaluate symptoms and progress in treatment and Medication management -Continue Wellbutrin 150 mg p.o. daily -Patient may benefit from reassessment once pain management is under control.   Disposition: Recommend psychiatric Inpatient admission when medically cleared. Derrill [place], NP 06/20/2021 1:33 PM

## 2021-06-20 NOTE — Progress Notes (Addendum)
Subjective: No acute events or concerns overnight. Patient reports abdominal pain at site of incision that is not adequately managed with current pain regimen. Patient reports pain is bad enough that he wishes he had a knife to cut open his abdomen himself. He continues to endorse desire to harm self, looking for items in the room that he can harm himself with. Last BM yesterday, passing gas daily. Reports worse PO intake since pain worsens after eating. Reports pain improved periodically after abdominal fluid was drained but fluid has re-accumulated.    Objective:  Vital signs in last 24 hours: Vitals:   06/19/21 1427 06/19/21 1559 06/19/21 2144 06/20/21 0602  BP: (!) 146/90 (!) 147/79 (!) 166/90 (!) 159/95  Pulse: 86 78 85 87  Resp:  16 18 18   Temp:  98 F (36.7 C) 98.8 F (37.1 C) 98.4 F (36.9 C)  TempSrc:  Oral Oral Oral  SpO2: 99% 97% 98% 95%  Weight:      Height:       Physical Exam General: alert, appears stated age, in no acute distress Abdomen: Soft, distended, bowel sounds present, epigastric tenderness, +shifting dullness on percussion  CV: regular rate and rhythm, likely bruit from AV fistula heard, nor rubs no gallops Pulm: Non labored breathing on room air, no wheezing or crackles.  Skin: Warm and dry Psych: suicidal ideations present, tearful affect, looking for object in room to harm self.  Assessment/Plan:  Principal Problem:   Major depressive disorder, single episode, severe ([place]) Active Problems:   Ileus ([place])   Protein-calorie malnutrition, severe   Bacterial peritonitis ([place])   Other ascites  Timothy [name] is a 60 yo M with PMHx of ESRD on HD TTS, colon cancer s/p resection 2014, HFpEF, HTN, and history of polysubstance use who was admitted for ileus and found to have bacterial peritonitis, with incidental finding of renal mass, and development of SI during admission.   Bacterial peritonitis  Moderate to large volume abdominal ascites  Ileus,  resolved S/p a recent exploratory laparotomy with lysis of adhesions on 05/23/2021. Adhesions 2/2 bowel resection for colon cancer in 2014. He returned to [facility] on 8/27 for ongoing abdominal pain and ileus. His Ileus has since resolved but he was found to have ascites of unclear etiology, now s/p paracentesis x2. After initial paracentesis he was started on IV antibiotics for possible secondary bacterial peritonitis. Peritoneal fluid culture did grow E Coli. Paracentesis 9/2  with 2.4L drained and fluid analysis showed no organisms on gram stain and ascitic fluid cultures with NGTD. Neutrophil count elevated but decreased from prior.  Normal total protein, albumin and glucose, with increased LDH. Cytology is pending. SAAG 0.3. Repeat CT abdomen/pelvis 9/2 showed moderate volume ascites and thick walled gallbladder (likely 2/2 infection/ascites).   Patient likely has secondary bacterial peritonitis though the source of his infection remains unclear. On IV antibiotics patient's ascitic fluid WBC count improving. It is possible that his recurrent ascites will likely resolve once the peritonitis in his abdomen resolves. Also considered malignant ascites though less likely as previous splenic hypo densities c/f metastatic disease no longer seen on recent CT and he had no other evidence metastatic disease on recent imaging. Also considering if HFpEF contributing to fluid accumulation. General surgery on following, and no acute surgical intervention warranted at this time.  - Appreciate General surgery and GI recommendations - Continue ceftriaxone day 6/10 - Follow up repeat cytology, culture from 9/2 NGTD - ECHO to evaluate for HF contributing to  fluid accumulation - Pain control with oral dilaudid 2mg  q4hr and IV dilaudid 0.5 mg q3h prn for severe breakthrough pain -Bowel regimen: senna, dulcolax, miralax  Suicidal ideations  Patient reported suicidal ideations and plan 9/2. He continues to note feeling  depressed due to his persistent abdominal pain and financial issues regarding his social security. He continues to endorse SI, states he will harm self if he had a knife available.  - Psychiatry consulted  - Wellbutrin 150mg  daily - Suicide precautions + sitter - Psychiatry recommends inpatient psychiatric admission since patient unable to contract for safety.   Normocytic Anemia Hx Anemia of CKD Patient's Hgb has downtrended from 12 to 8.8 over last 5 days. Patient receives darbepoetin shot with dialysis. Last iron profile 2017. Hx colon cancer, possible fecal occult blood loss. -repeat iron profile tomorrow with blood work with HD - FOBT  Hypoalbuminemia Patient's albumin 1.7 in the setting of poor PO intake and ESRD.  -Nutrition consult  Renal mass  Image findings suspicious for renal cell carcinoma vs proteinaceous or hemorrhagic renal cyst. Previously described splenic hypodensities no longer appreciated on repeat imaging, thus less likely malignant. CT chest negative for any metastatic lesions.  - Urology consulted and recommended MRI, this will be done 9/6 prior to HD.  - F/u urology recommendations.  ESRD on HD  HD 9/3 however patient felt HD was contributing to his abdominal pain and requested it be stopped. Only ~100cc of UF was removed.  Labs obtained at that time with no significant electrolyte abnormality except for patient's persistently elevated BUN. On his T/Th/Sat schedule.  - Nephrology on board, appreciate recommendations: next HD 9/6   HTN Blood pressures have been fluctuating, will continue to monitor after HD. Unfortunately, only able to obtain his BP readings on his distal RUE which is making it difficult to know if we are getting an appropriate assessment.  - Continue diltiazem 360 mg daily - Started on oral hydralazine 25 mg daily, may need to up titrate to therapeutic dosing    Diet: Renal / Carb modified VTE: lovenox IVF: none Code: Full   Prior to  Admission Living Arrangement: Home Anticipated Discharge Location: Home Barriers to Discharge: Clinical improvement Dispo: Anticipated discharge pending clinical improvement.   Wayland Denis, MD 06/20/2021, 7:29 AM Pager: [phone] After 5pm on weekdays and 1pm on weekends: On Call pager [phone]

## 2021-06-20 NOTE — Progress Notes (Addendum)
[facility] Progress Note   Subjective:    Seen and examined patient at bedside. Sitter also at bedside. No complaints at this time. Denies SOB. Plan for HD tomorrow 06/21/21.  Objective Vitals:   06/19/21 1559 06/19/21 2144 06/20/21 0602 06/20/21 0806  BP: (!) 147/79 (!) 166/90 (!) 159/95 (!) 155/83  Pulse: 78 85 87 85  Resp: 16 18 18 18   Temp: 98 F (36.7 C) 98.8 F (37.1 C) 98.4 F (36.9 C) 98.3 F (36.8 C)  TempSrc: Oral Oral Oral Oral  SpO2: 97% 98% 95% 100%  Weight:      Height:       Physical Exam General: Well-appearing male; NAD; on RA Heart: S1 and S2; No murmurs, gallops, or rales Lungs: CTA anteriorly Abdomen: Round and distended Extremities: No edema BLLE Dialysis Access: L AVF (+) bruit/thrill   Filed Weights   06/16/21 1715 06/18/21 0715 06/18/21 0900  Weight: 63.5 kg 66.5 kg 66.5 kg    Intake/Output Summary (Last 24 hours) at 06/20/2021 1411 Last data filed at 06/20/2021 1137 Gross per 24 hour  Intake 540 ml  Output --  Net 540 ml    Additional Objective Labs: Basic Metabolic Panel: Recent Labs  Lab 06/14/21 0134 06/15/21 0035 06/18/21 0739  NA 132* 132* 131*  K 5.5* 4.4 3.8  CL 90* 93* 94*  CO2 26 28 26   GLUCOSE 94 96 118*  BUN 73* 47* 63*  CREATININE 7.15* 5.23* 6.12*  CALCIUM 8.3* 8.0* 8.0*  PHOS  --   --  3.6   Liver Function Tests: Recent Labs  Lab 06/17/21 1738 06/18/21 0739  PROT 5.6*  --   ALBUMIN  --  1.7*   No results for input(s): LIPASE, AMYLASE in the last 168 hours. CBC: Recent Labs  Lab 06/14/21 0134 06/15/21 0035 06/18/21 0739  WBC 10.6* 12.5* 11.5*  NEUTROABS 9.2*  --   --   HGB 10.2* 9.4* 8.8*  HCT 29.4* 26.7* 26.4*  MCV 85.7 84.8 86.8  PLT 384 313 381   Blood Culture    Component Value Date/Time   SDES FLUID PERITONEAL 06/17/2021 1445   SDES FLUID PERITONEAL 06/17/2021 1445   SPECREQUEST BOTTLES DRAWN AEROBIC AND ANAEROBIC 06/17/2021 1445   SPECREQUEST NONE 06/17/2021 1445   CULT   06/17/2021 1445    NO GROWTH 3 DAYS Performed at [facility], [place] [address] [place], [place] [address]    REPTSTATUS PENDING 06/17/2021 1445   REPTSTATUS 06/18/2021 FINAL 06/17/2021 1445    Cardiac Enzymes: No results for input(s): CKTOTAL, CKMB, CKMBINDEX, TROPONINI in the last 168 hours. CBG: No results for input(s): GLUCAP in the last 168 hours. Iron Studies: No results for input(s): IRON, TIBC, TRANSFERRIN, FERRITIN in the last 72 hours. Lab Results  Component Value Date   INR 1.11 01/15/2018   INR 1.02 01/13/2018   Studies/Results: No results found.  Medications:  cefTRIAXone (ROCEPHIN)  IV 2 g (06/19/21 1632)    (feeding supplement) PROSource Plus  30 mL Oral BID BM   bisacodyl  10 mg Rectal Daily   buPROPion  150 mg Oral Daily   Chlorhexidine Gluconate Cloth  6 each Topical Q0600   darbepoetin (ARANESP) injection - DIALYSIS  40 mcg Intravenous Q Thu-HD   diltiazem  360 mg Oral Daily   enoxaparin (LOVENOX) injection  30 mg Subcutaneous Q24H   feeding supplement (NEPRO CARB STEADY)  237 mL Oral TID BM   ferric citrate  420 mg Oral TID WC  hydrALAZINE  25 mg Oral Daily   lidocaine  1 patch Transdermal Q24H   multivitamin  1 tablet Oral QHS   ondansetron (ZOFRAN) IV  4 mg Intravenous Once   pantoprazole  20 mg Oral Daily   senna  1 tablet Oral Daily    Dialysis Orders: TTS at [facility] 3:45 min 180NRe 450/Autoflow 1.5 2.0K/2.0 Ca 65 kgs  UFP 4 AVF - No heparin - Mircera 50 mcg IV q 4 weeks (Last dose 05/12/2021)  Assessment/Plan: 1. Ileus: Resolving. Diet has been advanced 2. E.Coli peritonitis: Cx 8/29 +. On Ceftriaxone. Surgery following for GB sludge/wall thickening, no surgical plans. Repeat paracentesis 9/2 with improving cell count (although still high), Cx NGTD. 2. ESRD: Continue HD per TTS schedule - next 9/6. No heparin. 3. Anemia: Hgb 8.8, continue Aranesp q Thursday. Holding IV iron for now. 4. Secondary hyperparathyroidism: Ca ok, Phos much  improved - continue Auryxia as binder. 5. HTN/volume: BP high, below prior EDW. No volume excess on exam except abdomen. 6. Nutrition: Alb 1.7 (low), continue protein supplements. 7. R renal mass: Needs OP follow-up. [place], NP [facility] 06/20/2021,2:11 PM  LOS: 8 days

## 2021-06-20 NOTE — Progress Notes (Signed)
Patient ID: Timothy [name], male   DOB: November 05, 1960, 60 y.o.   MRN: [id number] [facility] Surgery Progress Note:   * No surgery found *  Subjective: Mental status is alert and somewhat fussy.  Complaints bloating-no BM. Objective: Vital signs in last 24 hours: Temp:  [98 F (36.7 C)-98.8 F (37.1 C)] 98.3 F (36.8 C) (09/05 0806) Pulse Rate:  [78-87] 85 (09/05 0806) Resp:  [16-18] 18 (09/05 0806) BP: (146-189)/(79-106) 155/83 (09/05 0806) SpO2:  [95 %-100 %] 100 % (09/05 0806)  Intake/Output from previous day: 09/04 0701 - 09/05 0700 In: 720 [P.O.:720] Out: -  Intake/Output this shift: Total I/O In: 60 [P.O.:60] Out: -   Physical Exam: Work of breathing is normal.  Abdomen appears mildly distended. He has a prolapsed hemorrhoid that I reduced and then the nurse inserted a suppository for constipation  Lab Results:  No results found for this or any previous visit (from the past 48 hour(s)).  Radiology/Results: No results found.  Anti-infectives: Anti-infectives (From admission, onward)    Start     Dose/Rate Route Frequency Ordered Stop   06/16/21 2000  cefoTAXime (CLAFORAN) 2 g in dextrose 5 % 50 mL IVPB  Status:  Discontinued        2 g 140 mL/hr over 30 Minutes Intravenous Every 24 hours 06/15/21 1344 06/16/21 0858   06/16/21 1700  cefTRIAXone (ROCEPHIN) 2 g in sodium chloride 0.9 % 100 mL IVPB        2 g 200 mL/hr over 30 Minutes Intravenous Every 24 hours 06/16/21 0858     06/15/21 1430  cefoTAXime (CLAFORAN) 2 g in dextrose 5 % 50 mL IVPB        2 g 140 mL/hr over 30 Minutes Intravenous  Once 06/15/21 1344 06/15/21 1749       Assessment/Plan: Problem List: Patient Active Problem List   Diagnosis Date Noted   Major depressive disorder, single episode, severe ([place]) 06/18/2021   Bacterial peritonitis ([place])    Other ascites    Protein-calorie malnutrition, severe 06/13/2021   Ileus ([place]) 06/11/2021   Small bowel obstruction ([place]) 05/20/2021   DOE  (dyspnea on exertion) 10/25/2020   Orthopnea 10/25/2020   Open wound of left hand 01/18/2018   Tibial fracture 01/14/2018   Tibial plateau fracture, left 01/13/2018   Malnutrition of moderate degree 09/20/2016   AKI (acute kidney injury) ([place])    History of colon cancer    Hyponatremia    Leukocytosis    Acute blood loss anemia    Acute metabolic encephalopathy 42/68/3419   ESRD on dialysis ([place]) 09/09/2016   Hypertensive emergency 09/09/2016   Acute respiratory failure with hypoxia ([place]) 09/09/2016   Acute pulmonary edema ([employer]) 09/09/2016   Polysubstance abuse ([place]) 09/09/2016   Nonadherence to medical treatment 09/09/2016   Anemia due to chronic kidney disease 09/09/2016   Altered mental status 09/09/2016   Acute respiratory failure ([place])    Drug ingestion    Hypertensive heart and chronic kidney disease with heart failure and with stage 5 chronic kidney disease, or end stage renal disease ([place]) 07/19/2016   Chest pain    Elevated troponin 62/22/9798   Acute diastolic heart failure, NYHA class 2 ([place]) 07/17/2016   Heroin abuse ([place]) 07/17/2016   HTN (hypertension) 07/17/2016   Benign prostate hyperplasia 07/16/2015    Will see if he has a BM and this leads to his feeling less bloated.  If he is regaining ascites then the etiology will need to be  reconsidered.   * No surgery found *    LOS: 8 days   Matt B. Hassell Done, MD, [facility] [phone] to reach the surgeon on call.    06/20/2021 9:09 AM

## 2021-06-21 ENCOUNTER — Inpatient Hospital Stay (HOSPITAL_COMMUNITY): Payer: Medicare HMO

## 2021-06-21 DIAGNOSIS — F322 Major depressive disorder, single episode, severe without psychotic features: Secondary | ICD-10-CM | POA: Diagnosis not present

## 2021-06-21 DIAGNOSIS — E43 Unspecified severe protein-calorie malnutrition: Secondary | ICD-10-CM | POA: Diagnosis not present

## 2021-06-21 DIAGNOSIS — N186 End stage renal disease: Secondary | ICD-10-CM

## 2021-06-21 DIAGNOSIS — Z992 Dependence on renal dialysis: Secondary | ICD-10-CM

## 2021-06-21 DIAGNOSIS — K659 Peritonitis, unspecified: Secondary | ICD-10-CM | POA: Diagnosis not present

## 2021-06-21 DIAGNOSIS — R188 Other ascites: Secondary | ICD-10-CM | POA: Diagnosis not present

## 2021-06-21 LAB — COMPREHENSIVE METABOLIC PANEL
ALT: 15 U/L (ref 0–44)
AST: 18 U/L (ref 15–41)
Albumin: 1.6 g/dL — ABNORMAL LOW (ref 3.5–5.0)
Alkaline Phosphatase: 67 U/L (ref 38–126)
Anion gap: 12 (ref 5–15)
BUN: 78 mg/dL — ABNORMAL HIGH (ref 6–20)
CO2: 23 mmol/L (ref 22–32)
Calcium: 7.7 mg/dL — ABNORMAL LOW (ref 8.9–10.3)
Chloride: 92 mmol/L — ABNORMAL LOW (ref 98–111)
Creatinine, Ser: 8.36 mg/dL — ABNORMAL HIGH (ref 0.61–1.24)
GFR, Estimated: 7 mL/min — ABNORMAL LOW (ref >60)
Glucose, Bld: 127 mg/dL — ABNORMAL HIGH (ref 70–99)
Potassium: 4.2 mmol/L (ref 3.5–5.1)
Sodium: 127 mmol/L — ABNORMAL LOW (ref 135–145)
Total Bilirubin: 0.6 mg/dL (ref 0.3–1.2)
Total Protein: 5.3 g/dL — ABNORMAL LOW (ref 6.5–8.1)

## 2021-06-21 LAB — CBC
HCT: 24.8 % — ABNORMAL LOW (ref 39.0–52.0)
Hemoglobin: 8.5 g/dL — ABNORMAL LOW (ref 13.0–17.0)
MCH: 29.5 pg (ref 26.0–34.0)
MCHC: 34.3 g/dL (ref 30.0–36.0)
MCV: 86.1 fL (ref 80.0–100.0)
Platelets: 529 10*3/uL — ABNORMAL HIGH (ref 150–400)
RBC: 2.88 MIL/uL — ABNORMAL LOW (ref 4.22–5.81)
RDW: 14.4 % (ref 11.5–15.5)
WBC: 16.1 10*3/uL — ABNORMAL HIGH (ref 4.0–10.5)
nRBC: 0 % (ref 0.0–0.2)

## 2021-06-21 LAB — IRON AND TIBC
Iron: 24 ug/dL — ABNORMAL LOW (ref 45–182)
Saturation Ratios: 19 % (ref 17.9–39.5)
TIBC: 127 ug/dL — ABNORMAL LOW (ref 250–450)
UIBC: 103 ug/dL

## 2021-06-21 LAB — CYTOLOGY - NON PAP

## 2021-06-21 LAB — FERRITIN: Ferritin: 1741 ng/mL — ABNORMAL HIGH (ref 24–336)

## 2021-06-21 MED ORDER — GADOBUTROL 1 MMOL/ML IV SOLN
6.0000 mL | Freq: Once | INTRAVENOUS | Status: AC | PRN
  Administered 2021-06-21: 6 mL via INTRAVENOUS

## 2021-06-21 MED ORDER — NEPRO/CARBSTEADY PO LIQD
237.0000 mL | Freq: Three times a day (TID) | ORAL | Status: DC
  Administered 2021-06-21 – 2021-06-25 (×9): 237 mL via ORAL

## 2021-06-21 NOTE — Progress Notes (Signed)
Subjective: No acute events or concerns overnight. Patient reports abdominal pain at site of incision and new bilateral flank and back pain with swelling. Patient able to sleep and eat better with more adequate pain management today. LBM 6/4. Passing gas.    Objective:  Vital signs in last 24 hours: Vitals:   06/20/21 1634 06/20/21 2003 06/21/21 0555 06/21/21 0920  BP: (!) 146/72 (!) 142/82 (!) 169/94 (!) 168/83  Pulse: 85 88 88 88  Resp: 19 17 18 20   Temp: 99.1 F (37.3 C) 98.3 F (36.8 C) 98.1 F (36.7 C) 97.8 F (36.6 C)  TempSrc: Oral  Oral Oral  SpO2: 95% 94% 97% 97%  Weight:      Height:       Physical Exam General: alert, appears stated age, in no acute distress Abdomen: Soft, distended, bowel sounds present, epigastric tenderness, +shifting dullness on percussion, pitting edema bilateral flanks  CV: regular rate and rhythm, likely bruit from AV fistula heard, nor rubs no gallops Pulm: Non labored breathing on room air, no wheezing or crackles.  Skin: Warm and dry Psych: less tearful affect  Assessment/Plan:  Principal Problem:   Major depressive disorder, single episode, severe (HCC) Active Problems:   Ileus ([place])   Protein-calorie malnutrition, severe   Bacterial peritonitis ([place])   Other ascites   Renal mass  Timothy [name] is a 60 yo M with PMHx of ESRD on HD TTS, colon cancer s/p resection 2014, HFpEF, HTN, and history of polysubstance use who was admitted for ileus and found to have bacterial peritonitis, large volume ascites of unclear etiology, with incidental finding of renal mass, and development of SI during admission.   Bacterial peritonitis  Moderate to large volume abdominal ascites  Ileus, resolved S/p a recent exploratory laparotomy with lysis of adhesions on 05/23/2021. Adhesions 2/2 bowel resection for colon cancer in 2014. [facility] for ongoing abdominal pain and ileus. His Ileus has since resolved but he was found to have  ascites of unclear etiology, now s/p paracentesis x2. He was started on IV antibiotics for possible secondary bacterial peritonitis. Peritoneal fluid culture grew E Coli. Paracentesis 9/2 with 2.4L drained and fluid analysis with no organisms on gram stain and cultures with NGTD. Neutrophil count decreased from prior.  Normal total protein, albumin and glucose, with increased LDH. Cytology is pending. SAAG 0.3. Repeat CT abdomen/pelvis 9/2 showed moderate volume ascites and thick walled gallbladder (likely 2/2 infection/ascites).   Patient likely has secondary bacterial peritonitis though the source of his infection remains unclear. On IV antibiotics, patient's ascitic fluid WBC count improving. It is possible that his recurrent ascites will likely resolve once the peritonitis in his abdomen resolves. Also considered malignant ascites though less likely as previous splenic hypo densities c/f metastatic disease no longer seen on recent CT and he had no other evidence metastatic disease on recent imaging. Unliekly that heart failure is contributing to fluid accumulation given ECHO on 9/5. MRI renal shows iron deposition in liver and spleen. General surgery on following, and no acute surgical intervention warranted at this time.  - Appreciate General surgery and GI recommendations - Continue ceftriaxone day 7/10 -Follow up repeat cytology, culture from 9/2 NGTD -Follow up ascites fluid creatine to evaluate for urine as component of ascites fluid in rare and unlikely case that ureter was damaged during prior surgery - Pain control with oral dilaudid 2mg  q4hr and IV dilaudid 0.5 mg q3h prn for severe breakthrough pain -Bowel regimen:  senna, dulcolax, miralax  Hemosiderosis Incidental Imaging Finding Renal MRI on 9/6 showed evidence of iron deposition in the liver and spleen. Patient receives IV iron during HD as part of treatment for anemia as well as Aranesp shot. Prior iron studies with Iron 77, no ferritin  obtained at that time. Hgb down trending from 12.1 to 8.8 over last few days. -Hematology recommends outpatient follow up, likely 2/2 to IV iron given during HD sessions. -Iron profile, ferritin today with HD  Suicidal ideations  Patient reported suicidal ideations and plan 9/2. He continues to note feeling depressed due to his persistent abdominal pain and financial issues regarding his social security. He continues to endorse SI, states he will harm self if he had a knife available.  - Psychiatry consulted, appreciate recs  - Wellbutrin 150mg  daily - Suicide precautions + sitter - Psychiatry recommends inpatient psychiatric admission after medically cleared since patient unable to contract for safety.   Normocytic Anemia Hx Anemia of CKD Patient's Hgb has downtrended from 12 to 8.8 over last 5 days. Patient receives darbepoetin shot with dialysis. Last iron profile 2017. Hx colon cancer, possible fecal occult blood loss. -repeat iron profile 9/6 with blood work with HD - FOBT  Hypoalbuminemia Patient's albumin 1.7 in the setting of poor PO intake and ESRD.  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 Previously described splenic hypodensities no longer appreciated on repeat imaging, thus less likely malignant, MRI favoring benign lymphangioma or cyst. CT chest negative for any metastatic lesions. Renal MRI showed right renal lesion most consistent with hemorrhagic/proteinaceous cyst with hemosiderosis. - Urology consulted, appreciate recs  ESRD on HD  HD 9/3 however patient felt HD was contributing to his abdominal pain and requested it be stopped. Only ~100cc of UF was removed.  Labs obtained at that time with no significant electrolyte abnormality except for patient's persistently elevated BUN. On his T/Th/Sat schedule.  - Nephrology on board,  appreciate recommendations: next HD 9/6   HTN Blood pressures have been fluctuating, will check pressures during HD, if patient persistently elevated we can increase HTN meds - Continue diltiazem 360 mg daily - Oral hydralazine 25 mg daily, may need to up titrate to therapeutic dosing    Diet: Renal / Carb modified VTE: lovenox IVF: none Code: Full  Prior to Admission Living Arrangement: Home Anticipated Discharge Location: Home Barriers to Discharge: Clinical improvement Dispo: Anticipated discharge pending clinical improvement.   Wayland Denis, MD 06/21/2021, 11:48 AM Pager: [phone] After 5pm on weekdays and 1pm on weekends: On Call pager [phone]

## 2021-06-21 NOTE — Progress Notes (Addendum)
PT Cancellation Note  Patient Details Name: Timothy [name] MRN: [id number] DOB: [birth year]   Cancelled Treatment:    Reason Eval/Treat Not Completed: Patient at procedure or test/unavailable;Other (comment) (Pt. is off floor for HD at this time.  PT to f/u as time permits.)  Cashawn Yanko A. Tasheika Kitzmiller, PT, DPT Acute Rehabilitation Services Office: [place] 06/21/2021, 1:31 PM

## 2021-06-21 NOTE — Plan of Care (Signed)
  Problem: Education: Goal: Knowledge of General Education information will improve Description Including pain rating scale, medication(s)/side effects and non-pharmacologic comfort measures Outcome: Progressing   

## 2021-06-21 NOTE — Progress Notes (Signed)
Nutrition Follow-up  DOCUMENTATION CODES:   Severe malnutrition in context of chronic illness  INTERVENTION:   Remove Carb Modified restriction to current diet  Continue Renal MVI  Continue Nepro Shake po TID, each supplement provides 425 kcal and 19 grams protein  Continue 30 ml ProSource Plus BID, each supplement provides 100 kcals and 15 grams protein.   Allow double portions on meal trays, snacks   NUTRITION DIAGNOSIS:   Severe Malnutrition related to chronic illness (ESRD on HD) as evidenced by severe fat depletion, severe muscle depletion.  Being addressed via TF   GOAL:   Patient will meet greater than or equal to 90% of their needs  Progressing  MONITOR:   PO intake, Supplement acceptance, Diet advancement, Weight trends, Labs, I & O's  REASON FOR ASSESSMENT:   Consult Assessment of nutrition requirement/status  ASSESSMENT:   Pt with a PMH significant for ESRD on HD, colon Ca s/p resection, HFpEF, HTN, and polysubstance abuse admitted with an ileus.  8/29 Paracentesis 9/02 Repeat Paracentesis  +E.coli Peritonitis  Pt with 1:1 sitter due to SI, psych following  Pt complaining of abdominal discomfort, ready for HD.   Pt reports appetite is good; eating 15-100%. Pt requesting cake and chocolate milk on visit today. Noted snickers bar in pt bed. Pt reports he has not received a Nepro today but likes Butter Pecan Nepro and will drink.   Outpatient EDW 65 kg, current wt 70 kg  Labs: sodium 131 (L), phosphorus wdl Meds: aranesp, dulcolax, ferric citrate, renal MVI, miralax,    Diet Order:   Diet Order             Diet renal/carb modified with fluid restriction Diet-HS Snack? Nothing; Fluid restriction: 1200 mL Fluid; Room service appropriate? Yes; Fluid consistency: Thin  Diet effective now                   EDUCATION NEEDS:   No education needs have been identified at this time  Skin:  Skin Assessment: Reviewed RN Assessment  Last BM:   9/4  Height:   Ht Readings from Last 1 Encounters:  06/15/21 6\' 1"  (1.854 m)    Weight:   Wt Readings from Last 1 Encounters:  06/21/21 70.3 kg      BMI:  Body mass index is 20.45 kg/m.  Estimated Nutritional Needs:   Kcal:  2263-3354  Protein:  125-150 grams  Fluid:  1L+UOP    Kerman Passey MS, RDN, LDN, CNSC Registered Dietitian III Clinical Nutrition RD Pager and On-Call Pager Number Located in [place]

## 2021-06-21 NOTE — Progress Notes (Signed)
Patient transfered to Hemodialysis unit .

## 2021-06-21 NOTE — Progress Notes (Signed)
Subjective: CC: Patient with generalized bloating that comes in waves and radiates to his back, especially after eating. Denies Ruq tenderness or pain. No n/v. Passing flatus. Last BM 9/4  Objective: Vital signs in last 24 hours: Temp:  [97.8 F (36.6 C)-99.1 F (37.3 C)] 97.8 F (36.6 C) (09/06 0920) Pulse Rate:  [85-88] 88 (09/06 0920) Resp:  [17-20] 20 (09/06 0920) BP: (142-169)/(72-94) 168/83 (09/06 0920) SpO2:  [94 %-97 %] 97 % (09/06 0920) Last BM Date: 06/19/21  Intake/Output from previous day: 09/05 0701 - 09/06 0700 In: 300 [P.O.:300] Out: 0  Intake/Output this shift: No intake/output data recorded.  PE: Gen:  Alert, NAD, pleasant Abd: Soft, mild distension with ascitics, tenderness of left abdomen, suprapubic and epigastrium. No RUQ tenderness. Midline wound healed Skin: no rashes noted, warm and dry  Lab Results:  No results for input(s): WBC, HGB, HCT, PLT in the last 72 hours. BMET No results for input(s): NA, K, CL, CO2, GLUCOSE, BUN, CREATININE, CALCIUM in the last 72 hours. PT/INR No results for input(s): LABPROT, INR in the last 72 hours. CMP     Component Value Date/Time   NA 131 (L) 06/18/2021 0739   K 3.8 06/18/2021 0739   CL 94 (L) 06/18/2021 0739   CO2 26 06/18/2021 0739   GLUCOSE 118 (H) 06/18/2021 0739   BUN 63 (H) 06/18/2021 0739   CREATININE 6.12 (H) 06/18/2021 0739   CREATININE 1.59 (H) 07/29/2015 0943   CALCIUM 8.0 (L) 06/18/2021 0739   CALCIUM 8.4 (L) 09/14/2016 1615   PROT 5.6 (L) 06/17/2021 1738   ALBUMIN 1.7 (L) 06/18/2021 0739   AST 18 06/11/2021 1427   ALT 9 06/11/2021 1427   ALKPHOS 59 06/11/2021 1427   BILITOT 0.6 06/11/2021 1427   GFRNONAA 10 (L) 06/18/2021 0739   GFRNONAA 48 (L) 07/29/2015 0943   GFRAA 7 (L) 05/18/2019 2220   GFRAA 56 (L) 07/29/2015 0943   Lipase     Component Value Date/Time   LIPASE 50 06/11/2021 1427    Studies/Results: ECHOCARDIOGRAM COMPLETE  Result Date: 06/20/2021     ECHOCARDIOGRAM REPORT   Patient Name:   Timothy [name] Date of Exam: 06/20/2021 Medical Rec #:  [id number]       Height:       73.0 in Accession #:    [id number]      Weight:       146.6 lb Date of Birth:  [birth year]       BSA:          1.886 m Patient Age:    60 years        BP:           155/83 mmHg Patient Gender: M               HR:           85 bpm. Exam Location:  Inpatient Procedure: 2D Echo, Cardiac Doppler, Color Doppler and Strain Analysis Indications:    Ascities  History:        Patient has no prior history of Echocardiogram examinations and                 Patient has prior history of Echocardiogram examinations, most                 recent 09/11/2016. Signs/Symptoms:Dyspnea; Risk                 Factors:Hypertension and Former Smoker. ESRD.  Polysubstance                 abuse.  Sonographer:    Clayton Lefort RDCS (AE) Referring Phys: [id number] Velna Ochs  Sonographer Comments: Global longitudinal strain was attempted. IMPRESSIONS  1. Left ventricular ejection fraction, by estimation, is 55 to 60%. The left ventricle has normal function. The left ventricle has no regional wall motion abnormalities. There is moderate left ventricular hypertrophy. Left ventricular diastolic parameters are consistent with Grade I diastolic dysfunction (impaired relaxation). The average left ventricular global longitudinal strain is -14.2 %. The global longitudinal strain is abnormal.  2. Right ventricular systolic function is normal. The right ventricular size is normal. There is normal pulmonary artery systolic pressure. The estimated right ventricular systolic pressure is 19.7 mmHg.  3. Left atrial size was moderately dilated.  4. The mitral valve is normal in structure. Trivial mitral valve regurgitation. No evidence of mitral stenosis.  5. The aortic valve is tricuspid. Aortic valve regurgitation is trivial. Mild aortic valve stenosis. Aortic valve area, by VTI measures 1.79 cm. Aortic valve mean gradient measures 10.0  mmHg.  6. Aortic dilatation noted. There is mild dilatation of the aortic root, measuring 39 mm.  7. A small pericardial effusion is present. FINDINGS  Left Ventricle: Left ventricular ejection fraction, by estimation, is 55 to 60%. The left ventricle has normal function. The left ventricle has no regional wall motion abnormalities. The average left ventricular global longitudinal strain is -14.2 %. The global longitudinal strain is abnormal. The left ventricular internal cavity size was normal in size. There is moderate left ventricular hypertrophy. Left ventricular diastolic parameters are consistent with Grade I diastolic dysfunction (impaired relaxation). Right Ventricle: The right ventricular size is normal. No increase in right ventricular wall thickness. Right ventricular systolic function is normal. There is normal pulmonary artery systolic pressure. The tricuspid regurgitant velocity is 2.46 m/s, and  with an assumed right atrial pressure of 3 mmHg, the estimated right ventricular systolic pressure is 58.8 mmHg. Left Atrium: Left atrial size was moderately dilated. Right Atrium: Right atrial size was normal in size. Pericardium: A small pericardial effusion is present. Mitral Valve: The mitral valve is normal in structure. Trivial mitral valve regurgitation. No evidence of mitral valve stenosis. MV peak gradient, 4.4 mmHg. The mean mitral valve gradient is 3.0 mmHg. Tricuspid Valve: The tricuspid valve is normal in structure. Tricuspid valve regurgitation is trivial. Aortic Valve: The aortic valve is tricuspid. Aortic valve regurgitation is trivial. Aortic regurgitation PHT measures 334 msec. Mild aortic stenosis is present. Aortic valve mean gradient measures 10.0 mmHg. Aortic valve peak gradient measures 19.7 mmHg.  Aortic valve area, by VTI measures 1.79 cm. Pulmonic Valve: The pulmonic valve was normal in structure. Pulmonic valve regurgitation is not visualized. No evidence of pulmonic stenosis.  Aorta: Aortic dilatation noted. There is mild dilatation of the aortic root, measuring 39 mm. IAS/Shunts: No atrial level shunt detected by color flow Doppler.  LEFT VENTRICLE PLAX 2D LVIDd:         4.90 cm  Diastology LVIDs:         3.30 cm  LV e' medial:    7.51 cm/s LV PW:         1.90 cm  LV E/e' medial:  9.6 LV IVS:        1.40 cm  LV e' lateral:   12.60 cm/s LVOT diam:     1.90 cm  LV E/e' lateral: 5.7 LV SV:  35 LV SV Index:   34       2D Longitudinal Strain LVOT Area:     2.84 cm 2D Strain GLS Avg:     -14.2 %  RIGHT VENTRICLE             IVC RV Basal diam:  2.90 cm     IVC diam: 1.30 cm RV S prime:     25.50 cm/s TAPSE (M-mode): 2.2 cm LEFT ATRIUM              Index       RIGHT ATRIUM           Index LA diam:        4.20 cm  2.23 cm/m  RA Area:     14.20 cm LA Vol (A2C):   81.6 ml  43.28 ml/m RA Volume:   29.90 ml  15.86 ml/m LA Vol (A4C):   109.0 ml 57.81 ml/m LA Biplane Vol: 101.0 ml 53.56 ml/m  AORTIC VALVE AV Area (Vmax):    1.74 cm AV Area (Vmean):   1.67 cm AV Area (VTI):     1.79 cm AV Vmax:           222.00 cm/s AV Vmean:          145.000 cm/s AV VTI:            0.362 m AV Peak Grad:      19.7 mmHg AV Mean Grad:      10.0 mmHg LVOT Vmax:         136.00 cm/s LVOT Vmean:        85.600 cm/s LVOT VTI:          0.229 m LVOT/AV VTI ratio: 0.63 AI PHT:            334 msec  AORTA Ao Root diam: 3.90 cm Ao Asc diam:  3.60 cm MITRAL VALVE               TRICUSPID VALVE MV Area (PHT): 3.85 cm    TR Peak grad:   24.2 mmHg MV Area VTI:   2.36 cm    TR Vmax:        246.00 cm/s MV Peak grad:  4.4 mmHg MV Mean grad:  3.0 mmHg    SHUNTS MV Vmax:       1.05 m/s    Systemic VTI:  0.23 m MV Vmean:      76.6 cm/s   Systemic Diam: 1.90 cm MV Decel Time: 197 msec MV E velocity: 72.10 cm/s MV A velocity: 82.70 cm/s MV E/A ratio:  0.87 Dalton McleanMD Electronically signed by Franki Monte Signature Date/Time: 06/20/2021/4:07:20 PM    Final     Anti-infectives: Anti-infectives (From admission, onward)     Start     Dose/Rate Route Frequency Ordered Stop   06/16/21 2000  cefoTAXime (CLAFORAN) 2 g in dextrose 5 % 50 mL IVPB  Status:  Discontinued        2 g 140 mL/hr over 30 Minutes Intravenous Every 24 hours 06/15/21 1344 06/16/21 0858   06/16/21 1700  cefTRIAXone (ROCEPHIN) 2 g in sodium chloride 0.9 % 100 mL IVPB        2 g 200 mL/hr over 30 Minutes Intravenous Every 24 hours 06/16/21 0858 06/24/21 2359   06/15/21 1430  cefoTAXime (CLAFORAN) 2 g in dextrose 5 % 50 mL IVPB        2 g 140 mL/hr over 30 Minutes Intravenous  Once 06/15/21 1344 06/15/21 1749        Assessment/Plan Hx exploratory laparotomy/ adhesiolysis 05/23/21 for small bowel obstruction - Dr. Donne Hazel S/p previous right hemicolectomy for colon cancer - No radiographic or clinical evidence of sbo. Diet as tolerated.   Large volume ascites - s/p paracentesis 8/29. Paracentesis fluid with E. Coli and Cytology with mesothelial cells. S/p repeat paracentesis 9/2. Cx pending. They are continuing to work this up. On abx. Getting echo and Renal MRI. Defer to IM. Unclear etiology.   Cholelithiasis - Reviewed notes, felt not to have acute cholecystitis. GB wall thickening likely reactive to ascites. His wbc is improving. Last LFT check was wnl. He has no ruq tenderness on my exam and is tolerating his diet.   Will discuss plan moving forward with MD   FEN: Renal ID: Rocephin VTE: Lovenox    ESRD Renal mass 3.4 cm - Urology following. They are getting Renal MRI Splenic infarct vs met  SI -psych following    LOS: 9 days    Jillyn Ledger , [facility] 06/21/2021, 9:46 AM Please see [place] for pager number during day hours 7:00am-4:30pm

## 2021-06-21 NOTE — Progress Notes (Signed)
[facility] Progress Note   Subjective:   seen on HD, no c/o's.   Objective Vitals:   06/21/21 1500 06/21/21 1530 06/21/21 1600 06/21/21 1630  BP: (!) 153/77 131/76 (!) 166/91 (!) 148/75  Pulse: 82 84 82 85  Resp:      Temp:      TempSrc:      SpO2:      Weight:      Height:       Physical Exam General: Well-appearing male; NAD; on RA Heart: S1 and S2; No murmurs, gallops, or rales Lungs: CTA anteriorly Abdomen: Round and distended Extremities: 1+ ankle/ pedal edema Dialysis Access: L AVF (+) bruit/thrill   Filed Weights   06/18/21 0715 06/18/21 0900 06/21/21 1316  Weight: 66.5 kg 66.5 kg 70.3 kg    Intake/Output Summary (Last 24 hours) at 06/21/2021 1700 Last data filed at 06/21/2021 1034 Gross per 24 hour  Intake 600 ml  Output --  Net 600 ml     Additional Objective Labs: Basic Metabolic Panel: Recent Labs  Lab 06/15/21 0035 06/18/21 0739 06/21/21 0909  NA 132* 131* 127*  K 4.4 3.8 4.2  CL 93* 94* 92*  CO2 28 26 23   GLUCOSE 96 118* 127*  BUN 47* 63* 78*  CREATININE 5.23* 6.12* 8.36*  CALCIUM 8.0* 8.0* 7.7*  PHOS  --  3.6  --     Liver Function Tests: Recent Labs  Lab 06/17/21 1738 06/18/21 0739 06/21/21 0909  AST  --   --  18  ALT  --   --  15  ALKPHOS  --   --  67  BILITOT  --   --  0.6  PROT 5.6*  --  5.3*  ALBUMIN  --  1.7* 1.6*    No results for input(s): LIPASE, AMYLASE in the last 168 hours. CBC: Recent Labs  Lab 06/15/21 0035 06/18/21 0739 06/21/21 0909  WBC 12.5* 11.5* 16.1*  HGB 9.4* 8.8* 8.5*  HCT 26.7* 26.4* 24.8*  MCV 84.8 86.8 86.1  PLT 313 381 529*    Blood Culture    Component Value Date/Time   SDES FLUID PERITONEAL 06/17/2021 1445   SDES FLUID PERITONEAL 06/17/2021 1445   SPECREQUEST BOTTLES DRAWN AEROBIC AND ANAEROBIC 06/17/2021 1445   SPECREQUEST NONE 06/17/2021 1445   CULT  06/17/2021 1445    NO GROWTH 4 DAYS Performed at [facility], [place] [address] [place],  [address]     REPTSTATUS PENDING 06/17/2021 1445   REPTSTATUS 06/18/2021 FINAL 06/17/2021 1445    Cardiac Enzymes: No results for input(s): CKTOTAL, CKMB, CKMBINDEX, TROPONINI in the last 168 hours. CBG: No results for input(s): GLUCAP in the last 168 hours. Iron Studies:  Recent Labs    06/21/21 0909  IRON 24*  TIBC 127*  FERRITIN 1,741*   Lab Results  Component Value Date   INR 1.11 01/15/2018   INR 1.02 01/13/2018   Studies/Results: MR ABDOMEN W WO CONTRAST  Result Date: 06/21/2021 CLINICAL DATA:  CT demonstrating a right renal mass. EXAM: MRI ABDOMEN WITHOUT AND WITH CONTRAST TECHNIQUE: Multiplanar multisequence MR imaging of the abdomen was performed both before and after the administration of intravenous contrast. CONTRAST:  88mL GADAVIST GADOBUTROL 1 MMOL/ML IV SOLN COMPARISON:  CT of 06/17/2021 FINDINGS: Moderate to markedly motion degraded exam. Lower chest: Mild cardiomegaly.  Small left pleural effusion. Hepatobiliary: iron deposition within the liver. Hepatomegaly at 19.5 cm craniocaudal. Gallstones of up to 1.5 cm. No specific evidence of  acute cholecystitis. No biliary duct dilatation. Pancreas: Borderline pancreatic duct dilatation, without cause identified. Spleen: A 1.7 cm cystic lesion within the superior aspect of the spleen is incompletely evaluated, but favored to represent a benign lymphangioma or cyst. No splenomegaly. Splenic iron deposition. Adrenals/Urinary Tract: Normal adrenal glands. Renal atrophy with numerous tiny renal cysts, likely related to dialysis. Corresponding to the CT abnormality, within the anterior interpolar right kidney, is a 2.6 cm lesion which demonstrates peripheral T1-T2 hypointensity and central precontrast T1 hyperintensity, including on 75/16. After contrast, no enhancement identified. The subtracted images are nondiagnostic secondary to motion. No hydronephrosis. Stomach/Bowel: Grossly normal stomach. Large and small bowel loops are prominent and  gas-filled. Example small bowel loop at up to 4.3 cm within the left side of the abdomen on 28/8. Vascular/Lymphatic: Normal aortic caliber. Aortic atherosclerosis. No gross abdominal adenopathy. Other:  Moderate volume ascites. Musculoskeletal: No acute osseous abnormality. IMPRESSION: 1. Moderate to markedly motion degraded exam. 2. The right renal lesion is most consistent with a hemorrhagic/proteinaceous cyst. 3. Hemosiderosis. 4. Ascites and left pleural effusion, suggesting fluid overload. 5. Cholelithiasis 6.  Aortic Atherosclerosis (ICD10-I70.0). 7. Mildly distended large and small bowel, favoring adynamic ileus. Suboptimally evaluated. Electronically Signed   By: Abigail Miyamoto M.D.   On: 06/21/2021 12:04   ECHOCARDIOGRAM COMPLETE  Result Date: 06/20/2021    ECHOCARDIOGRAM REPORT   Patient Name:   Timothy [name] Date of Exam: 06/20/2021 Medical Rec #:  [id number]       Height:       73.0 in Accession #:    [id number]      Weight:       146.6 lb Date of Birth:  1961/01/26       BSA:          1.886 m Patient Age:    60 years        BP:           155/83 mmHg Patient Gender: M               HR:           85 bpm. Exam Location:  Inpatient Procedure: 2D Echo, Cardiac Doppler, Color Doppler and Strain Analysis Indications:    Ascities  History:        Patient has no prior history of Echocardiogram examinations and                 Patient has prior history of Echocardiogram examinations, most                 recent 09/11/2016. Signs/Symptoms:Dyspnea; Risk                 Factors:Hypertension and Former Smoker. ESRD. Polysubstance                 abuse.  Sonographer:    Clayton Lefort RDCS (AE) Referring Phys: [id number] Velna Ochs  Sonographer Comments: Global longitudinal strain was attempted. IMPRESSIONS  1. Left ventricular ejection fraction, by estimation, is 55 to 60%. The left ventricle has normal function. The left ventricle has no regional wall motion abnormalities. There is moderate left ventricular  hypertrophy. Left ventricular diastolic parameters are consistent with Grade I diastolic dysfunction (impaired relaxation). The average left ventricular global longitudinal strain is -14.2 %. The global longitudinal strain is abnormal.  2. Right ventricular systolic function is normal. The right ventricular size is normal. There is normal pulmonary artery systolic pressure. The estimated right ventricular systolic pressure is 27.2  mmHg.  3. Left atrial size was moderately dilated.  4. The mitral valve is normal in structure. Trivial mitral valve regurgitation. No evidence of mitral stenosis.  5. The aortic valve is tricuspid. Aortic valve regurgitation is trivial. Mild aortic valve stenosis. Aortic valve area, by VTI measures 1.79 cm. Aortic valve mean gradient measures 10.0 mmHg.  6. Aortic dilatation noted. There is mild dilatation of the aortic root, measuring 39 mm.  7. A small pericardial effusion is present. FINDINGS  Left Ventricle: Left ventricular ejection fraction, by estimation, is 55 to 60%. The left ventricle has normal function. The left ventricle has no regional wall motion abnormalities. The average left ventricular global longitudinal strain is -14.2 %. The global longitudinal strain is abnormal. The left ventricular internal cavity size was normal in size. There is moderate left ventricular hypertrophy. Left ventricular diastolic parameters are consistent with Grade I diastolic dysfunction (impaired relaxation). Right Ventricle: The right ventricular size is normal. No increase in right ventricular wall thickness. Right ventricular systolic function is normal. There is normal pulmonary artery systolic pressure. The tricuspid regurgitant velocity is 2.46 m/s, and  with an assumed right atrial pressure of 3 mmHg, the estimated right ventricular systolic pressure is 89.2 mmHg. Left Atrium: Left atrial size was moderately dilated. Right Atrium: Right atrial size was normal in size. Pericardium: A  small pericardial effusion is present. Mitral Valve: The mitral valve is normal in structure. Trivial mitral valve regurgitation. No evidence of mitral valve stenosis. MV peak gradient, 4.4 mmHg. The mean mitral valve gradient is 3.0 mmHg. Tricuspid Valve: The tricuspid valve is normal in structure. Tricuspid valve regurgitation is trivial. Aortic Valve: The aortic valve is tricuspid. Aortic valve regurgitation is trivial. Aortic regurgitation PHT measures 334 msec. Mild aortic stenosis is present. Aortic valve mean gradient measures 10.0 mmHg. Aortic valve peak gradient measures 19.7 mmHg.  Aortic valve area, by VTI measures 1.79 cm. Pulmonic Valve: The pulmonic valve was normal in structure. Pulmonic valve regurgitation is not visualized. No evidence of pulmonic stenosis. Aorta: Aortic dilatation noted. There is mild dilatation of the aortic root, measuring 39 mm. IAS/Shunts: No atrial level shunt detected by color flow Doppler.  LEFT VENTRICLE PLAX 2D LVIDd:         4.90 cm  Diastology LVIDs:         3.30 cm  LV e' medial:    7.51 cm/s LV PW:         1.90 cm  LV E/e' medial:  9.6 LV IVS:        1.40 cm  LV e' lateral:   12.60 cm/s LVOT diam:     1.90 cm  LV E/e' lateral: 5.7 LV SV:         65 LV SV Index:   34       2D Longitudinal Strain LVOT Area:     2.84 cm 2D Strain GLS Avg:     -14.2 %  RIGHT VENTRICLE             IVC RV Basal diam:  2.90 cm     IVC diam: 1.30 cm RV S prime:     25.50 cm/s TAPSE (M-mode): 2.2 cm LEFT ATRIUM              Index       RIGHT ATRIUM           Index LA diam:        4.20 cm  2.23 cm/m  RA Area:  14.20 cm LA Vol (A2C):   81.6 ml  43.28 ml/m RA Volume:   29.90 ml  15.86 ml/m LA Vol (A4C):   109.0 ml 57.81 ml/m LA Biplane Vol: 101.0 ml 53.56 ml/m  AORTIC VALVE AV Area (Vmax):    1.74 cm AV Area (Vmean):   1.67 cm AV Area (VTI):     1.79 cm AV Vmax:           222.00 cm/s AV Vmean:          145.000 cm/s AV VTI:            0.362 m AV Peak Grad:      19.7 mmHg AV Mean Grad:       10.0 mmHg LVOT Vmax:         136.00 cm/s LVOT Vmean:        85.600 cm/s LVOT VTI:          0.229 m LVOT/AV VTI ratio: 0.63 AI PHT:            334 msec  AORTA Ao Root diam: 3.90 cm Ao Asc diam:  3.60 cm MITRAL VALVE               TRICUSPID VALVE MV Area (PHT): 3.85 cm    TR Peak grad:   24.2 mmHg MV Area VTI:   2.36 cm    TR Vmax:        246.00 cm/s MV Peak grad:  4.4 mmHg MV Mean grad:  3.0 mmHg    SHUNTS MV Vmax:       1.05 m/s    Systemic VTI:  0.23 m MV Vmean:      76.6 cm/s   Systemic Diam: 1.90 cm MV Decel Time: 197 msec MV E velocity: 72.10 cm/s MV A velocity: 82.70 cm/s MV E/A ratio:  0.87 Dalton McleanMD Electronically signed by Franki Monte Signature Date/Time: 06/20/2021/4:07:20 PM    Final     Medications:  cefTRIAXone (ROCEPHIN)  IV 2 g (06/20/21 1639)    (feeding supplement) PROSource Plus  30 mL Oral BID BM   bisacodyl  10 mg Rectal Daily   buPROPion  150 mg Oral Daily   Chlorhexidine Gluconate Cloth  6 each Topical Q0600   darbepoetin (ARANESP) injection - DIALYSIS  40 mcg Intravenous Q Thu-HD   diltiazem  360 mg Oral Daily   enoxaparin (LOVENOX) injection  30 mg Subcutaneous Q24H   feeding supplement (NEPRO CARB STEADY)  237 mL Oral TID BM   ferric citrate  420 mg Oral TID WC   hydrALAZINE  25 mg Oral Daily   lidocaine  1 patch Transdermal Q24H   multivitamin  1 tablet Oral QHS   ondansetron (ZOFRAN) IV  4 mg Intravenous Once   pantoprazole  20 mg Oral Daily   polyethylene glycol  17 g Oral Daily   senna  1 tablet Oral Daily    Dialysis Orders: TTS at [facility] 3:45 min 180NRe 450/Autoflow 1.5 2.0K/2.0 Ca 65 kgs  UFP 4 AVF - No heparin - Mircera 50 mcg IV q 4 weeks (Last dose 05/12/2021)  Assessment/Plan: 1. Ileus: Resolving. Diet has been advanced 2. E.Coli peritonitis: Cx 8/29 +. On Ceftriaxone. Surgery following for GB sludge/wall thickening, no surgical plans. Repeat paracentesis 9/2 with improving cell count (although still high), Cx NGTD. 2. ESRD: HD TTS.  HD  today on schedule.  3. Anemia: Hgb 8.8, continue Aranesp q Thursday. Holding IV iron for now. 4. Secondary hyperparathyroidism: Ca  ok, Phos much improved - continue Auryxia as binder. 5. HTN/volume: BP high and now wt's are up 1-4 kg. UF 2L today, tolerated well.  Cont to lower volume, probably has new dry wt around 62 kg. Check standing wts before next HD.  6. Nutrition: Alb 1.7 (low), continue protein supplements. 7. R renal mass: Needs OP follow-up. Pella, MD 06/21/2021, 5:03 PM

## 2021-06-21 NOTE — Progress Notes (Signed)
Spoke to MRI and Dialysis.  Dialysis(Vonchelle) stated that the nurse needs to call Dialysis right away when the patient gets back to the floor from MRI so they can take him  MRI(Denise) made aware about this and she stated that she will put him on early schedule today for MRI.   Will endorse to incoming shift.

## 2021-06-22 ENCOUNTER — Inpatient Hospital Stay (HOSPITAL_COMMUNITY): Payer: Medicare HMO

## 2021-06-22 DIAGNOSIS — E43 Unspecified severe protein-calorie malnutrition: Secondary | ICD-10-CM | POA: Diagnosis not present

## 2021-06-22 DIAGNOSIS — F322 Major depressive disorder, single episode, severe without psychotic features: Secondary | ICD-10-CM | POA: Diagnosis not present

## 2021-06-22 DIAGNOSIS — K659 Peritonitis, unspecified: Secondary | ICD-10-CM | POA: Diagnosis not present

## 2021-06-22 DIAGNOSIS — N186 End stage renal disease: Secondary | ICD-10-CM | POA: Diagnosis not present

## 2021-06-22 HISTORY — PX: IR PARACENTESIS: IMG2679

## 2021-06-22 LAB — CREATININE, FLUID (PLEURAL, PERITONEAL, JP DRAINAGE): Creat, Fluid: 5.7 mg/dL

## 2021-06-22 LAB — BODY FLUID CELL COUNT WITH DIFFERENTIAL
Eos, Fluid: 0 %
Lymphs, Fluid: 7 %
Monocyte-Macrophage-Serous Fluid: 6 % — ABNORMAL LOW (ref 50–90)
Neutrophil Count, Fluid: 87 % — ABNORMAL HIGH (ref 0–25)
Total Nucleated Cell Count, Fluid: 990 cu mm (ref 0–1000)

## 2021-06-22 LAB — CULTURE, BODY FLUID W GRAM STAIN -BOTTLE: Culture: NO GROWTH

## 2021-06-22 LAB — PROTEIN, PLEURAL OR PERITONEAL FLUID: Total protein, fluid: 3.6 g/dL

## 2021-06-22 LAB — HEPATITIS PANEL, ACUTE
HCV Ab: REACTIVE — AB
Hep A IgM: NONREACTIVE
Hep B C IgM: NONREACTIVE
Hepatitis B Surface Ag: NONREACTIVE

## 2021-06-22 LAB — LACTATE DEHYDROGENASE, PLEURAL OR PERITONEAL FLUID: LD, Fluid: 101 U/L — ABNORMAL HIGH (ref 3–23)

## 2021-06-22 LAB — ALBUMIN, PLEURAL OR PERITONEAL FLUID: Albumin, Fluid: 1.1 g/dL

## 2021-06-22 LAB — GLUCOSE, PLEURAL OR PERITONEAL FLUID: Glucose, Fluid: 84 mg/dL

## 2021-06-22 MED ORDER — HEPATITIS B VAC RECOMB ADJ 20 MCG/0.5ML IM SOLN
0.5000 mL | Freq: Once | INTRAMUSCULAR | Status: DC
  Filled 2021-06-22: qty 1

## 2021-06-22 MED ORDER — LIDOCAINE HCL 1 % IJ SOLN
INTRAMUSCULAR | Status: DC | PRN
  Administered 2021-06-22: 10 mL

## 2021-06-22 MED ORDER — ALBUMIN HUMAN 25 % IV SOLN
25.0000 g | Freq: Once | INTRAVENOUS | Status: AC
  Administered 2021-06-22: 25 g via INTRAVENOUS
  Filled 2021-06-22 (×2): qty 100

## 2021-06-22 MED ORDER — HEPATITIS B VAC RECOMBINANT 10 MCG/0.5ML IJ SUSP: 2.0000 mL | Freq: Once | INTRAMUSCULAR | Status: DC

## 2021-06-22 MED ORDER — HYDRALAZINE HCL 25 MG PO TABS
25.0000 mg | ORAL_TABLET | Freq: Three times a day (TID) | ORAL | Status: DC
  Administered 2021-06-23 – 2021-06-25 (×7): 25 mg via ORAL
  Filled 2021-06-22 (×7): qty 1

## 2021-06-22 MED ORDER — TECHNETIUM TC 99M MEBROFENIN IV KIT
5.4000 | PACK | Freq: Once | INTRAVENOUS | Status: AC | PRN
  Administered 2021-06-22: 5.4 via INTRAVENOUS

## 2021-06-22 MED ORDER — HEPATITIS B VAC RECOMB ADJ 20 MCG/0.5ML IM SOSY
0.5000 mL | PREFILLED_SYRINGE | Freq: Once | INTRAMUSCULAR | Status: AC
  Administered 2021-06-22: 0.5 mL via INTRAMUSCULAR
  Filled 2021-06-22: qty 0.5

## 2021-06-22 MED ORDER — LIDOCAINE HCL 1 % IJ SOLN
INTRAMUSCULAR | Status: AC
  Filled 2021-06-22: qty 20

## 2021-06-22 MED ORDER — HYDROMORPHONE HCL 1 MG/ML IJ SOLN
1.0000 mg | Freq: Once | INTRAMUSCULAR | Status: AC
  Administered 2021-06-22: 1 mg via INTRAVENOUS
  Filled 2021-06-22: qty 1

## 2021-06-22 NOTE — Progress Notes (Addendum)
[facility] Progress Note   Subjective:    Seen and examined patient at bedside. S/p paracentesis yesterday-1L fluid was removed and sent to lab for analysis. Tolerated yesterday's HD with net UF 1.5L. Plan for HD 9/8.  Objective Vitals:   06/22/21 0456 06/22/21 0456 06/22/21 0804 06/22/21 1100  BP: (!) 140/95 (!) 140/95 (!) 167/89 (!) 147/89  Pulse: 92 92 96   Resp: 15 15 18    Temp: 98.9 F (37.2 C) 98.9 F (37.2 C) 98.9 F (37.2 C)   TempSrc: Oral Oral Oral   SpO2: 97% 97% 98%   Weight:      Height:       Physical Exam General: Well-appearing male; NAD; on RA Heart: S1 and S2; No murmurs, gallops, or rales Lungs: CTA anteriorly Abdomen: Round and distended Extremities: 1+ pedal edema Dialysis Access: L AVF (+) bruit/thrill   Filed Weights   06/18/21 0900 06/21/21 1316 06/21/21 1705  Weight: 66.5 kg 70.3 kg 69 kg    Intake/Output Summary (Last 24 hours) at 06/22/2021 1423 Last data filed at 06/21/2021 1814 Gross per 24 hour  Intake 120 ml  Output 1500 ml  Net -1380 ml    Additional Objective Labs: Basic Metabolic Panel: Recent Labs  Lab 06/18/21 0739 06/21/21 0909  NA 131* 127*  K 3.8 4.2  CL 94* 92*  CO2 26 23  GLUCOSE 118* 127*  BUN 63* 78*  CREATININE 6.12* 8.36*  CALCIUM 8.0* 7.7*  PHOS 3.6  --    Liver Function Tests: Recent Labs  Lab 06/17/21 1738 06/18/21 0739 06/21/21 0909  AST  --   --  18  ALT  --   --  15  ALKPHOS  --   --  67  BILITOT  --   --  0.6  PROT 5.6*  --  5.3*  ALBUMIN  --  1.7* 1.6*   No results for input(s): LIPASE, AMYLASE in the last 168 hours. CBC: Recent Labs  Lab 06/18/21 0739 06/21/21 0909  WBC 11.5* 16.1*  HGB 8.8* 8.5*  HCT 26.4* 24.8*  MCV 86.8 86.1  PLT 381 529*   Blood Culture    Component Value Date/Time   SDES FLUID PERITONEAL 06/17/2021 1445   SDES FLUID PERITONEAL 06/17/2021 1445   SPECREQUEST BOTTLES DRAWN AEROBIC AND ANAEROBIC 06/17/2021 1445   SPECREQUEST NONE 06/17/2021  1445   CULT  06/17/2021 1445    NO GROWTH 5 DAYS Performed at [facility], [place] [address], [place], [place] [address]    REPTSTATUS 06/22/2021 FINAL 06/17/2021 1445   REPTSTATUS 06/18/2021 FINAL 06/17/2021 1445    Cardiac Enzymes: No results for input(s): CKTOTAL, CKMB, CKMBINDEX, TROPONINI in the last 168 hours. CBG: No results for input(s): GLUCAP in the last 168 hours. Iron Studies:  Recent Labs    06/21/21 0909  IRON 24*  TIBC 127*  FERRITIN 1,741*   Lab Results  Component Value Date   INR 1.11 01/15/2018   INR 1.02 01/13/2018   Studies/Results: MR ABDOMEN W WO CONTRAST  Result Date: 06/21/2021 CLINICAL DATA:  CT demonstrating a right renal mass. EXAM: MRI ABDOMEN WITHOUT AND WITH CONTRAST TECHNIQUE: Multiplanar multisequence MR imaging of the abdomen was performed both before and after the administration of intravenous contrast. CONTRAST:  55mL GADAVIST GADOBUTROL 1 MMOL/ML IV SOLN COMPARISON:  CT of 06/17/2021 FINDINGS: Moderate to markedly motion degraded exam. Lower chest: Mild cardiomegaly.  Small left pleural effusion. Hepatobiliary: iron deposition within the liver. Hepatomegaly at 19.5 cm  craniocaudal. Gallstones of up to 1.5 cm. No specific evidence of acute cholecystitis. No biliary duct dilatation. Pancreas: Borderline pancreatic duct dilatation, without cause identified. Spleen: A 1.7 cm cystic lesion within the superior aspect of the spleen is incompletely evaluated, but favored to represent a benign lymphangioma or cyst. No splenomegaly. Splenic iron deposition. Adrenals/Urinary Tract: Normal adrenal glands. Renal atrophy with numerous tiny renal cysts, likely related to dialysis. Corresponding to the CT abnormality, within the anterior interpolar right kidney, is a 2.6 cm lesion which demonstrates peripheral T1-T2 hypointensity and central precontrast T1 hyperintensity, including on 75/16. After contrast, no enhancement identified. The subtracted images are  nondiagnostic secondary to motion. No hydronephrosis. Stomach/Bowel: Grossly normal stomach. Large and small bowel loops are prominent and gas-filled. Example small bowel loop at up to 4.3 cm within the left side of the abdomen on 28/8. Vascular/Lymphatic: Normal aortic caliber. Aortic atherosclerosis. No gross abdominal adenopathy. Other:  Moderate volume ascites. Musculoskeletal: No acute osseous abnormality. IMPRESSION: 1. Moderate to markedly motion degraded exam. 2. The right renal lesion is most consistent with a hemorrhagic/proteinaceous cyst. 3. Hemosiderosis. 4. Ascites and left pleural effusion, suggesting fluid overload. 5. Cholelithiasis 6.  Aortic Atherosclerosis (ICD10-I70.0). 7. Mildly distended large and small bowel, favoring adynamic ileus. Suboptimally evaluated. Electronically Signed   By: Abigail Miyamoto M.D.   On: 06/21/2021 12:04   ECHOCARDIOGRAM COMPLETE  Result Date: 06/20/2021    ECHOCARDIOGRAM REPORT   Patient Name:   NALU TROUBLEFIELD Date of Exam: 06/20/2021 Medical Rec #:  [id number]       Height:       73.0 in Accession #:    [id number]      Weight:       146.6 lb Date of Birth:  [birth year]       BSA:          1.886 m Patient Age:    60 years        BP:           155/83 mmHg Patient Gender: M               HR:           85 bpm. Exam Location:  Inpatient Procedure: 2D Echo, Cardiac Doppler, Color Doppler and Strain Analysis Indications:    Ascities  History:        Patient has no prior history of Echocardiogram examinations and                 Patient has prior history of Echocardiogram examinations, most                 recent 09/11/2016. Signs/Symptoms:Dyspnea; Risk                 Factors:Hypertension and Former Smoker. ESRD. Polysubstance                 abuse.  Sonographer:    Clayton Lefort RDCS (AE) Referring Phys: [id number] Velna Ochs  Sonographer Comments: Global longitudinal strain was attempted. IMPRESSIONS  1. Left ventricular ejection fraction, by estimation, is 55 to 60%. The  left ventricle has normal function. The left ventricle has no regional wall motion abnormalities. There is moderate left ventricular hypertrophy. Left ventricular diastolic parameters are consistent with Grade I diastolic dysfunction (impaired relaxation). The average left ventricular global longitudinal strain is -14.2 %. The global longitudinal strain is abnormal.  2. Right ventricular systolic function is normal. The right ventricular size is normal. There is normal pulmonary  artery systolic pressure. The estimated right ventricular systolic pressure is 99.2 mmHg.  3. Left atrial size was moderately dilated.  4. The mitral valve is normal in structure. Trivial mitral valve regurgitation. No evidence of mitral stenosis.  5. The aortic valve is tricuspid. Aortic valve regurgitation is trivial. Mild aortic valve stenosis. Aortic valve area, by VTI measures 1.79 cm. Aortic valve mean gradient measures 10.0 mmHg.  6. Aortic dilatation noted. There is mild dilatation of the aortic root, measuring 39 mm.  7. A small pericardial effusion is present. FINDINGS  Left Ventricle: Left ventricular ejection fraction, by estimation, is 55 to 60%. The left ventricle has normal function. The left ventricle has no regional wall motion abnormalities. The average left ventricular global longitudinal strain is -14.2 %. The global longitudinal strain is abnormal. The left ventricular internal cavity size was normal in size. There is moderate left ventricular hypertrophy. Left ventricular diastolic parameters are consistent with Grade I diastolic dysfunction (impaired relaxation). Right Ventricle: The right ventricular size is normal. No increase in right ventricular wall thickness. Right ventricular systolic function is normal. There is normal pulmonary artery systolic pressure. The tricuspid regurgitant velocity is 2.46 m/s, and  with an assumed right atrial pressure of 3 mmHg, the estimated right ventricular systolic pressure is  42.6 mmHg. Left Atrium: Left atrial size was moderately dilated. Right Atrium: Right atrial size was normal in size. Pericardium: A small pericardial effusion is present. Mitral Valve: The mitral valve is normal in structure. Trivial mitral valve regurgitation. No evidence of mitral valve stenosis. MV peak gradient, 4.4 mmHg. The mean mitral valve gradient is 3.0 mmHg. Tricuspid Valve: The tricuspid valve is normal in structure. Tricuspid valve regurgitation is trivial. Aortic Valve: The aortic valve is tricuspid. Aortic valve regurgitation is trivial. Aortic regurgitation PHT measures 334 msec. Mild aortic stenosis is present. Aortic valve mean gradient measures 10.0 mmHg. Aortic valve peak gradient measures 19.7 mmHg.  Aortic valve area, by VTI measures 1.79 cm. Pulmonic Valve: The pulmonic valve was normal in structure. Pulmonic valve regurgitation is not visualized. No evidence of pulmonic stenosis. Aorta: Aortic dilatation noted. There is mild dilatation of the aortic root, measuring 39 mm. IAS/Shunts: No atrial level shunt detected by color flow Doppler.  LEFT VENTRICLE PLAX 2D LVIDd:         4.90 cm  Diastology LVIDs:         3.30 cm  LV e' medial:    7.51 cm/s LV PW:         1.90 cm  LV E/e' medial:  9.6 LV IVS:        1.40 cm  LV e' lateral:   12.60 cm/s LVOT diam:     1.90 cm  LV E/e' lateral: 5.7 LV SV:         65 LV SV Index:   34       2D Longitudinal Strain LVOT Area:     2.84 cm 2D Strain GLS Avg:     -14.2 %  RIGHT VENTRICLE             IVC RV Basal diam:  2.90 cm     IVC diam: 1.30 cm RV S prime:     25.50 cm/s TAPSE (M-mode): 2.2 cm LEFT ATRIUM              Index       RIGHT ATRIUM           Index LA diam:  4.20 cm  2.23 cm/m  RA Area:     14.20 cm LA Vol (A2C):   81.6 ml  43.28 ml/m RA Volume:   29.90 ml  15.86 ml/m LA Vol (A4C):   109.0 ml 57.81 ml/m LA Biplane Vol: 101.0 ml 53.56 ml/m  AORTIC VALVE AV Area (Vmax):    1.74 cm AV Area (Vmean):   1.67 cm AV Area (VTI):     1.79 cm  AV Vmax:           222.00 cm/s AV Vmean:          145.000 cm/s AV VTI:            0.362 m AV Peak Grad:      19.7 mmHg AV Mean Grad:      10.0 mmHg LVOT Vmax:         136.00 cm/s LVOT Vmean:        85.600 cm/s LVOT VTI:          0.229 m LVOT/AV VTI ratio: 0.63 AI PHT:            334 msec  AORTA Ao Root diam: 3.90 cm Ao Asc diam:  3.60 cm MITRAL VALVE               TRICUSPID VALVE MV Area (PHT): 3.85 cm    TR Peak grad:   24.2 mmHg MV Area VTI:   2.36 cm    TR Vmax:        246.00 cm/s MV Peak grad:  4.4 mmHg MV Mean grad:  3.0 mmHg    SHUNTS MV Vmax:       1.05 m/s    Systemic VTI:  0.23 m MV Vmean:      76.6 cm/s   Systemic Diam: 1.90 cm MV Decel Time: 197 msec MV E velocity: 72.10 cm/s MV A velocity: 82.70 cm/s MV E/A ratio:  0.87 Dalton McleanMD Electronically signed by Franki Monte Signature Date/Time: 06/20/2021/4:07:20 PM    Final    IR Paracentesis  Result Date: 06/22/2021 INDICATION: Patient with history of end-stage renal disease with recurrent ascites. Request for therapeutic and diagnostic paracentesis EXAM: ULTRASOUND GUIDED THERAPEUTIC AND DIAGNOSTIC PARACENTESIS MEDICATIONS: Lidocaine 1% 10 mL COMPLICATIONS: None immediate. PROCEDURE: Informed written consent was obtained from the patient after a discussion of the risks, benefits and alternatives to treatment. A timeout was performed prior to the initiation of the procedure. Initial ultrasound scanning demonstrates a small amount of ascites within the left lower abdominal quadrant. The left lower abdomen was prepped and draped in the usual sterile fashion. 1% lidocaine was used for local anesthesia. Following this, a 19 gauge, 7-cm, Yueh catheter was introduced. An ultrasound image was saved for documentation purposes. The paracentesis was performed. The catheter was removed and a dressing was applied. The patient tolerated the procedure well without immediate post procedural complication. FINDINGS: A total of approximately 1 L of straw-colored  fluid was removed. Samples were sent to the laboratory as requested by the clinical team. IMPRESSION: Successful ultrasound-guided therapeutic and diagnostic paracentesis yielding 1 liters of peritoneal fluid. Read by: Rushie Nyhan, NP Electronically Signed   By: Sandi Mariscal M.D.   On: 06/22/2021 13:34    Medications:  albumin human     cefTRIAXone (ROCEPHIN)  IV 2 g (06/21/21 1814)    (feeding supplement) PROSource Plus  30 mL Oral BID BM   bisacodyl  10 mg Rectal Daily   buPROPion  150 mg Oral Daily  Chlorhexidine Gluconate Cloth  6 each Topical Q0600   darbepoetin (ARANESP) injection - DIALYSIS  40 mcg Intravenous Q Thu-HD   diltiazem  360 mg Oral Daily   enoxaparin (LOVENOX) injection  30 mg Subcutaneous Q24H   feeding supplement (NEPRO CARB STEADY)  237 mL Oral TID BM   ferric citrate  420 mg Oral TID WC   hydrALAZINE  25 mg Oral Daily    HYDROmorphone (DILAUDID) injection  1 mg Intravenous Once   lidocaine  1 patch Transdermal Q24H   lidocaine       multivitamin  1 tablet Oral QHS   ondansetron (ZOFRAN) IV  4 mg Intravenous Once   pantoprazole  20 mg Oral Daily   polyethylene glycol  17 g Oral Daily   senna  1 tablet Oral Daily    Dialysis Orders: TTS at [facility] 3:45 min 180NRe 450/Autoflow 1.5 2.0K/2.0 Ca 65 kgs  UFP 4 AVF - No heparin - Mircera 50 mcg IV q 4 weeks (Last dose 05/12/2021)  Assessment/Plan: Ileus: Resolving. Diet has been advanced E.Coli peritonitis/ new ascites: Cx 8/29 +. On Ceftriaxone. Surgery following for GB sludge/wall thickening, no surgical plans. Paracentesis on 9/2 with improving cell count (although still high), Cx NGTD. Repeat para on 9/6-1L removed and labs sent for analysis. Nephrogenic ascites is not really a diagnosis, but vol excess in general can contribute to ascites in some ESRD pts. See #7 below.  Recent SBO/ sp exlap/ LOA - on 05/23/21 ESRD: HD TTS.  Tolerated yesterday's HD with net UF 1.5L. Plan for HD 9/8. Anemia: Hgb 8.5,  continue Aranesp q Thursday. Holding IV iron for now. Monitor trends, may need to raise dose. Secondary hyperparathyroidism: Ca ok, Phos much improved - continue Auryxia as binder. HTN/volume: BP high, up 3-4kg and he prob has lost a few kg. Will plan to lower volume more aggressively as tolerates w/ next few HD sessions.  Nutrition: Alb 1.7 (low), continue protein supplements. R renal mass: Needs OP follow-up. Hep C  Tobie Poet, NP [facility] 06/22/2021,2:23 PM  LOS: 10 days    Pt seen, examined and agree w A/P as above.  Kelly Splinter  MD 06/22/2021, 4:26 PM

## 2021-06-22 NOTE — Progress Notes (Signed)
OT Cancellation Note  Patient Details Name: Timothy [name] MRN: [id number] DOB: Aug 15, 1961   Cancelled Treatment:    Reason Eval/Treat Not Completed: Pain limiting ability to participate. Patient reports he was in pain and waiting on medicine as well as "a lot of other things going on" and refused therapy today - all day. Of note, patient noted to walk to microwave with nursing staff without physical assistance approximately 20 minutes later.    Geneen Dieter L Danila Eddie 06/22/2021, 3:23 PM

## 2021-06-22 NOTE — Progress Notes (Signed)
Subjective: No acute events or concerns overnight. Patient tolerating PO intake. States he wants fluid drained since it is causing him pain. He has relief will fluid drainage as well as with passage of gas. He has minimal improvement in pain with lidocaine topical patch and with BMs. Last substantial BM this morning per patient. We discussed possible etiology of ascites. We discussed possibility that there may be no way to prevent fluid build up and that he may have this condition for the rest of his life. Patient's wife was at bedside for this conversation. Patient expressing SI after this discussion. Asked patient and wife is they would like to speak with palliative and they will consider.   Objective:  Vital signs in last 24 hours: Vitals:   06/22/21 0456 06/22/21 0456 06/22/21 0804 06/22/21 1100  BP: (!) 140/95 (!) 140/95 (!) 167/89 (!) 147/89  Pulse: 92 92 96   Resp: 15 15 18    Temp: 98.9 F (37.2 C) 98.9 F (37.2 C) 98.9 F (37.2 C)   TempSrc: Oral Oral Oral   SpO2: 97% 97% 98%   Weight:      Height:       Physical Exam General: alert, appears stated age, in no acute distress Abdomen: Soft, distended, bowel sounds present, epigastric tenderness, +shifting dullness on percussion, pitting edema bilateral flanks  CV: regular rate and rhythm, likely bruit from AV fistula heard, nor rubs no gallops Pulm: Non labored breathing on room air, no wheezing or crackles.  Skin: Warm and dry Psych: less tearful affect  Assessment/Plan:  Principal Problem:   Major depressive disorder, single episode, severe ([place]) Active Problems:   ESRD on hemodialysis ([place])   Ileus ([place])   Protein-calorie malnutrition, severe   Bacterial peritonitis ([place])   Other ascites   Renal mass  Timothy [name] is a 60 yo M with PMHx of ESRD on HD TTS, colon cancer s/p resection 2014, HFpEF, HTN, and history of polysubstance use who was admitted for ileus and found to have bacterial peritonitis, mod volume  ascites of unclear etiology, with incidental finding of renal mass, and development of SI during admission.   Bacterial peritonitis  Moderate to large volume abdominal ascites  Ileus, resolved S/p a recent exploratory laparotomy with lysis of adhesions on 05/23/2021. Adhesions 2/2 bowel resection for colon cancer in 2014. He returned to [facility] on 8/27 for ongoing abdominal pain and ileus. His Ileus has since resolved but he was found to have ascites of unclear etiology, now s/p paracentesis x2. He was started on IV antibiotics for possible secondary bacterial peritonitis. Peritoneal fluid culture grew E Coli. Paracentesis 9/2 with 2.4L drained and fluid analysis with no organisms on gram stain and cultures with NGTD. Neutrophil count decreased from prior.  Normal total protein, albumin and glucose, with increased LDH. Cytology is pending. SAAG 0.3. Repeat CT abdomen/pelvis 9/2 showed moderate volume ascites and thick walled gallbladder (likely 2/2 infection/ascites).   Patient likely has secondary bacterial peritonitis though the source of his infection remains unclear. Surgery evaluating for biliary disease as secondary cause of bacterial peritonitis. On IV antibiotics, patient's ascitic fluid WBC count improving. Unlikely that heart failure is contributing to fluid accumulation given ECHO on 9/5. If ascites was related to bacterial peritonitis we would have expected for fluid accumulation to slow after 8 days of abx. We have also considered malignant ascites though less likely since he had no evidence metastatic disease on recent imaging. Possible HD related ascites vs post surgical complication causing  ascites vs other unknown cause. Patient has worsening leukocytosis 9/6 unclear etiology, possibly due to biliary disease vs bacterial peritonitis vs other source.  -General surgery following, and no acute surgical intervention warranted at this time.  - Appreciate general surgery and GI recommendations -  Continue ceftriaxone day 8/10 -Repeat cytology negative, culture from 9/2 NGTD - HIDA scan to evaluate for acute biliary disease today -IR abd paracentesis today with albumin -Ascites fluid creatine lab with next paracentesis 9/7 to evaluate for urine as component of ascites fluid in rare and unlikely case that ureter was damaged during prior surgery - Pain control with oral dilaudid 2mg  q4hr and IV dilaudid 0.5 mg q3h prn for severe breakthrough pain -Bowel regimen: senna, dulcolax, miralax -consider palliative care consult  Hemosiderosis Incidental Imaging Finding Renal MRI on 9/6 showed evidence of iron deposition in the liver and spleen. Patient receives IV iron during HD as part of treatment for anemia as well as Aranesp shot. Prior iron studies with Iron 77, no ferritin obtained at that time. Hgb down trending from 12.1 to 8.8 over last few days, now stable at ~8. Iron studies 9/6 showed iron 24, % sat 19, ferritin 1741. -Hematology recommends outpatient follow up, likely 2/2 to IV iron given during HD sessions. -Hold off on IV iron with HD  Suicidal ideations  Patient reported suicidal ideations and plan 9/2. He continues to note feeling depressed due to his persistent abdominal pain and financial issues regarding his social security. He continues to endorse SI. - Psychiatry consulted, appreciate recs  - Wellbutrin 150mg  daily - Suicide precautions + sitter - Psychiatry recommends inpatient psychiatric admission after medically cleared since patient unable to contract for safety.   Normocytic Anemia Hx Anemia of CKD Patient's Hgb has downtrended from 12 to 8.8 over last 5 days. Now stable ~8. Patient receives darbepoetin shot with dialysis. Last iron profile 9/6  iron 24, % sat 19, ferritin 1741. Hx colon cancer, possible fecal occult blood loss. - FOBT pending  Hypoalbuminemia Patient's albumin 1.7 in the setting of poor PO intake and ESRD. Will give albumin with next  paracentesis. 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 Previously described splenic hypodensities no longer appreciated on repeat imaging, thus less likely malignant, MRI favoring benign lymphangioma or cyst. CT chest negative for any metastatic lesions. Renal MRI showed right renal lesion most consistent with hemorrhagic/proteinaceous cyst with hemosiderosis. - Urology consulted, appreciate recs  ESRD on HD  HD 9/3 however patient felt HD was contributing to his abdominal pain and requested it be stopped. Only ~100cc of UF was removed.  Labs obtained at that time with no significant electrolyte abnormality except for patient's persistently elevated BUN. On his T/Th/Sat schedule.  - Nephrology on board, appreciate recommendations: next HD 9/8   HTN Blood pressures have been fluctuating, will check pressures during HD, if patient persistently elevated we can increase HTN meds - Continue diltiazem 360 mg daily - Oral hydralazine 25 mg daily, may need to up titrate to therapeutic dosing    Diet: Renal / Carb modified VTE: lovenox IVF: none Code: Full  Prior to Admission Living Arrangement: Home Anticipated Discharge Location: Home Barriers to Discharge: Clinical improvement Dispo: Anticipated discharge pending clinical improvement.   Wayland Denis, MD 06/22/2021, 12:50 PM Pager: [phone] After 5pm on weekdays and 1pm on weekends: On Call pager [phone]

## 2021-06-22 NOTE — Procedures (Signed)
Ultrasound-guided diagnostic and therapeutic paracentesis performed yielding 1 liters of straw colored fluid.  Fluid was sent to lab for analysis. No immediate complications. EBL is none.

## 2021-06-22 NOTE — Progress Notes (Signed)
PT Cancellation Note  Patient Details Name: Timothy [name] MRN: [id number] DOB: December 31, 1960   Cancelled Treatment:    Reason Eval/Treat Not Completed: Patient declined, no reason specified Refused PT today as he is eating and just walked with his sitter, who reports he did better but is still a bit unsteady when moving. Will continue efforts.   Windell Norfolk, DPT, PN2   Supplemental Physical Therapist [place]    Pager [phone] Acute Rehab Office [phone]

## 2021-06-22 NOTE — Care Management (Signed)
PT no longer recommending home health PT, order discontinued and Cory with [facility] aware.

## 2021-06-22 NOTE — Progress Notes (Signed)
Subjective: CC: HIDA planned for today He reports central abdominal pain that radiates to his right side and back. Reports constant bloating that is worse after eating. He denies his pain worsening after eating. He is tolerating his diet without n/v. Continues to pass flatus and have BM's.   Objective: Vital signs in last 24 hours: Temp:  [97.8 F (36.6 C)-98.9 F (37.2 C)] 98.9 F (37.2 C) (09/07 0804) Pulse Rate:  [82-96] 96 (09/07 0804) Resp:  [14-20] 18 (09/07 0804) BP: (131-168)/(75-95) 167/89 (09/07 0804) SpO2:  [97 %-98 %] 98 % (09/07 0804) Weight:  [69 kg-70.3 kg] 69 kg (09/06 1705) Last BM Date: 06/21/21  Intake/Output from previous day: 09/06 0701 - 09/07 0700 In: 720 [P.O.:720] Out: 1500  Intake/Output this shift: No intake/output data recorded.  PE: Gen:  Alert, NAD, pleasant Abd: Soft, mild distension with ascitics, tenderness of central abdomen, epigastrium and RUQ. Midline wound healed Skin: no rashes noted, warm and dry  Lab Results:  Recent Labs    06/21/21 0909  WBC 16.1*  HGB 8.5*  HCT 24.8*  PLT 529*   BMET Recent Labs    06/21/21 0909  NA 127*  K 4.2  CL 92*  CO2 23  GLUCOSE 127*  BUN 78*  CREATININE 8.36*  CALCIUM 7.7*   PT/INR No results for input(s): LABPROT, INR in the last 72 hours. CMP     Component Value Date/Time   NA 127 (L) 06/21/2021 0909   K 4.2 06/21/2021 0909   CL 92 (L) 06/21/2021 0909   CO2 23 06/21/2021 0909   GLUCOSE 127 (H) 06/21/2021 0909   BUN 78 (H) 06/21/2021 0909   CREATININE 8.36 (H) 06/21/2021 0909   CREATININE 1.59 (H) 07/29/2015 0943   CALCIUM 7.7 (L) 06/21/2021 0909   CALCIUM 8.4 (L) 09/14/2016 1615   PROT 5.3 (L) 06/21/2021 0909   ALBUMIN 1.6 (L) 06/21/2021 0909   AST 18 06/21/2021 0909   ALT 15 06/21/2021 0909   ALKPHOS 67 06/21/2021 0909   BILITOT 0.6 06/21/2021 0909   GFRNONAA 7 (L) 06/21/2021 0909   GFRNONAA 48 (L) 07/29/2015 0943   GFRAA 7 (L) 05/18/2019 2220   GFRAA 56 (L)  07/29/2015 0943   Lipase     Component Value Date/Time   LIPASE 50 06/11/2021 1427    Studies/Results: MR ABDOMEN W WO CONTRAST  Result Date: 06/21/2021 CLINICAL DATA:  CT demonstrating a right renal mass. EXAM: MRI ABDOMEN WITHOUT AND WITH CONTRAST TECHNIQUE: Multiplanar multisequence MR imaging of the abdomen was performed both before and after the administration of intravenous contrast. CONTRAST:  95m GADAVIST GADOBUTROL 1 MMOL/ML IV SOLN COMPARISON:  CT of 06/17/2021 FINDINGS: Moderate to markedly motion degraded exam. Lower chest: Mild cardiomegaly.  Small left pleural effusion. Hepatobiliary: iron deposition within the liver. Hepatomegaly at 19.5 cm craniocaudal. Gallstones of up to 1.5 cm. No specific evidence of acute cholecystitis. No biliary duct dilatation. Pancreas: Borderline pancreatic duct dilatation, without cause identified. Spleen: A 1.7 cm cystic lesion within the superior aspect of the spleen is incompletely evaluated, but favored to represent a benign lymphangioma or cyst. No splenomegaly. Splenic iron deposition. Adrenals/Urinary Tract: Normal adrenal glands. Renal atrophy with numerous tiny renal cysts, likely related to dialysis. Corresponding to the CT abnormality, within the anterior interpolar right kidney, is a 2.6 cm lesion which demonstrates peripheral T1-T2 hypointensity and central precontrast T1 hyperintensity, including on 75/16. After contrast, no enhancement identified. The subtracted images are nondiagnostic secondary to  motion. No hydronephrosis. Stomach/Bowel: Grossly normal stomach. Large and small bowel loops are prominent and gas-filled. Example small bowel loop at up to 4.3 cm within the left side of the abdomen on 28/8. Vascular/Lymphatic: Normal aortic caliber. Aortic atherosclerosis. No gross abdominal adenopathy. Other:  Moderate volume ascites. Musculoskeletal: No acute osseous abnormality. IMPRESSION: 1. Moderate to markedly motion degraded exam. 2. The  right renal lesion is most consistent with a hemorrhagic/proteinaceous cyst. 3. Hemosiderosis. 4. Ascites and left pleural effusion, suggesting fluid overload. 5. Cholelithiasis 6.  Aortic Atherosclerosis (ICD10-I70.0). 7. Mildly distended large and small bowel, favoring adynamic ileus. Suboptimally evaluated. Electronically Signed   By: Abigail Miyamoto M.D.   On: 06/21/2021 12:04   ECHOCARDIOGRAM COMPLETE  Result Date: 06/20/2021    ECHOCARDIOGRAM REPORT   Patient Name:   Timothy [name] Date of Exam: 06/20/2021 Medical Rec #:  [id number]       Height:       73.0 in Accession #:    [id number]      Weight:       146.6 lb Date of Birth:  03-Feb-1961       BSA:          1.886 m Patient Age:    60 years        BP:           155/83 mmHg Patient Gender: M               HR:           85 bpm. Exam Location:  Inpatient Procedure: 2D Echo, Cardiac Doppler, Color Doppler and Strain Analysis Indications:    Ascities  History:        Patient has no prior history of Echocardiogram examinations and                 Patient has prior history of Echocardiogram examinations, most                 recent 09/11/2016. Signs/Symptoms:Dyspnea; Risk                 Factors:Hypertension and Former Smoker. ESRD. Polysubstance                 abuse.  Sonographer:    Clayton Lefort RDCS (AE) Referring Phys: [id number] Velna Ochs  Sonographer Comments: Global longitudinal strain was attempted. IMPRESSIONS  1. Left ventricular ejection fraction, by estimation, is 55 to 60%. The left ventricle has normal function. The left ventricle has no regional wall motion abnormalities. There is moderate left ventricular hypertrophy. Left ventricular diastolic parameters are consistent with Grade I diastolic dysfunction (impaired relaxation). The average left ventricular global longitudinal strain is -14.2 %. The global longitudinal strain is abnormal.  2. Right ventricular systolic function is normal. The right ventricular size is normal. There is normal  pulmonary artery systolic pressure. The estimated right ventricular systolic pressure is 31.5 mmHg.  3. Left atrial size was moderately dilated.  4. The mitral valve is normal in structure. Trivial mitral valve regurgitation. No evidence of mitral stenosis.  5. The aortic valve is tricuspid. Aortic valve regurgitation is trivial. Mild aortic valve stenosis. Aortic valve area, by VTI measures 1.79 cm. Aortic valve mean gradient measures 10.0 mmHg.  6. Aortic dilatation noted. There is mild dilatation of the aortic root, measuring 39 mm.  7. A small pericardial effusion is present. FINDINGS  Left Ventricle: Left ventricular ejection fraction, by estimation, is 55 to 60%. The left ventricle has normal  function. The left ventricle has no regional wall motion abnormalities. The average left ventricular global longitudinal strain is -14.2 %. The global longitudinal strain is abnormal. The left ventricular internal cavity size was normal in size. There is moderate left ventricular hypertrophy. Left ventricular diastolic parameters are consistent with Grade I diastolic dysfunction (impaired relaxation). Right Ventricle: The right ventricular size is normal. No increase in right ventricular wall thickness. Right ventricular systolic function is normal. There is normal pulmonary artery systolic pressure. The tricuspid regurgitant velocity is 2.46 m/s, and  with an assumed right atrial pressure of 3 mmHg, the estimated right ventricular systolic pressure is 59.1 mmHg. Left Atrium: Left atrial size was moderately dilated. Right Atrium: Right atrial size was normal in size. Pericardium: A small pericardial effusion is present. Mitral Valve: The mitral valve is normal in structure. Trivial mitral valve regurgitation. No evidence of mitral valve stenosis. MV peak gradient, 4.4 mmHg. The mean mitral valve gradient is 3.0 mmHg. Tricuspid Valve: The tricuspid valve is normal in structure. Tricuspid valve regurgitation is trivial.  Aortic Valve: The aortic valve is tricuspid. Aortic valve regurgitation is trivial. Aortic regurgitation PHT measures 334 msec. Mild aortic stenosis is present. Aortic valve mean gradient measures 10.0 mmHg. Aortic valve peak gradient measures 19.7 mmHg.  Aortic valve area, by VTI measures 1.79 cm. Pulmonic Valve: The pulmonic valve was normal in structure. Pulmonic valve regurgitation is not visualized. No evidence of pulmonic stenosis. Aorta: Aortic dilatation noted. There is mild dilatation of the aortic root, measuring 39 mm. IAS/Shunts: No atrial level shunt detected by color flow Doppler.  LEFT VENTRICLE PLAX 2D LVIDd:         4.90 cm  Diastology LVIDs:         3.30 cm  LV e' medial:    7.51 cm/s LV PW:         1.90 cm  LV E/e' medial:  9.6 LV IVS:        1.40 cm  LV e' lateral:   12.60 cm/s LVOT diam:     1.90 cm  LV E/e' lateral: 5.7 LV SV:         65 LV SV Index:   34       2D Longitudinal Strain LVOT Area:     2.84 cm 2D Strain GLS Avg:     -14.2 %  RIGHT VENTRICLE             IVC RV Basal diam:  2.90 cm     IVC diam: 1.30 cm RV S prime:     25.50 cm/s TAPSE (M-mode): 2.2 cm LEFT ATRIUM              Index       RIGHT ATRIUM           Index LA diam:        4.20 cm  2.23 cm/m  RA Area:     14.20 cm LA Vol (A2C):   81.6 ml  43.28 ml/m RA Volume:   29.90 ml  15.86 ml/m LA Vol (A4C):   109.0 ml 57.81 ml/m LA Biplane Vol: 101.0 ml 53.56 ml/m  AORTIC VALVE AV Area (Vmax):    1.74 cm AV Area (Vmean):   1.67 cm AV Area (VTI):     1.79 cm AV Vmax:           222.00 cm/s AV Vmean:          145.000 cm/s AV VTI:  0.362 m AV Peak Grad:      19.7 mmHg AV Mean Grad:      10.0 mmHg LVOT Vmax:         136.00 cm/s LVOT Vmean:        85.600 cm/s LVOT VTI:          0.229 m LVOT/AV VTI ratio: 0.63 AI PHT:            334 msec  AORTA Ao Root diam: 3.90 cm Ao Asc diam:  3.60 cm MITRAL VALVE               TRICUSPID VALVE MV Area (PHT): 3.85 cm    TR Peak grad:   24.2 mmHg MV Area VTI:   2.36 cm    TR Vmax:         246.00 cm/s MV Peak grad:  4.4 mmHg MV Mean grad:  3.0 mmHg    SHUNTS MV Vmax:       1.05 m/s    Systemic VTI:  0.23 m MV Vmean:      76.6 cm/s   Systemic Diam: 1.90 cm MV Decel Time: 197 msec MV E velocity: 72.10 cm/s MV A velocity: 82.70 cm/s MV E/A ratio:  0.87 Dalton McleanMD Electronically signed by Franki Monte Signature Date/Time: 06/20/2021/4:07:20 PM    Final     Anti-infectives: Anti-infectives (From admission, onward)    Start     Dose/Rate Route Frequency Ordered Stop   06/16/21 2000  cefoTAXime (CLAFORAN) 2 g in dextrose 5 % 50 mL IVPB  Status:  Discontinued        2 g 140 mL/hr over 30 Minutes Intravenous Every 24 hours 06/15/21 1344 06/16/21 0858   06/16/21 1700  cefTRIAXone (ROCEPHIN) 2 g in sodium chloride 0.9 % 100 mL IVPB        2 g 200 mL/hr over 30 Minutes Intravenous Every 24 hours 06/16/21 0858 06/24/21 2359   06/15/21 1430  cefoTAXime (CLAFORAN) 2 g in dextrose 5 % 50 mL IVPB        2 g 140 mL/hr over 30 Minutes Intravenous  Once 06/15/21 1344 06/15/21 1749        Assessment/Plan Hx exploratory laparotomy/ adhesiolysis 05/23/21 for small bowel obstruction - Dr. Donne Hazel S/p previous right hemicolectomy for colon cancer - No radiographic or clinical evidence of sbo. Diet as tolerated.   Large volume ascites - s/p paracentesis 8/29. Paracentesis fluid with E. Coli and Cytology with mesothelial cells. S/p repeat paracentesis 9/2. Cx pending. On abx. IM continuing to work this up. Unclear etiology. Per their notes they question whether this could be nephrogenic ascites with his ESRD. We are obtaining HIDA to r/o his Gallbladder. See below.    Cholelithiasis - GB wall thickening on prior imaging could be reactive in the setting of ascites. LFT are wnl. WBC 11.5 > 16.1. He does have some RUQ tenderness on exam today and complains of post-prandial symptoms. Reasonable to obtain HIDA as already planned to definitively r/o Acute Cholecystitis.      FEN: NPO for  HIDA ID: Rocephin VTE: Lovenox    ESRD on HD Renal mass 3.4 cm - Urology following. Renal MRI c/w hemorrhagic/proteinaceous cyst. 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 Sitter at bedside   LOS: 10 days    Jillyn Ledger , [facility] Surgery 06/22/2021, 8:27 AM Please see Amion for pager number during day hours 7:00am-4:30pm

## 2021-06-23 DIAGNOSIS — R1084 Generalized abdominal pain: Secondary | ICD-10-CM

## 2021-06-23 DIAGNOSIS — G8918 Other acute postprocedural pain: Secondary | ICD-10-CM

## 2021-06-23 DIAGNOSIS — K659 Peritonitis, unspecified: Secondary | ICD-10-CM | POA: Diagnosis not present

## 2021-06-23 DIAGNOSIS — N186 End stage renal disease: Secondary | ICD-10-CM | POA: Diagnosis not present

## 2021-06-23 DIAGNOSIS — F322 Major depressive disorder, single episode, severe without psychotic features: Secondary | ICD-10-CM | POA: Diagnosis not present

## 2021-06-23 DIAGNOSIS — R109 Unspecified abdominal pain: Secondary | ICD-10-CM

## 2021-06-23 LAB — HCV RNA QUANT RFLX ULTRA OR GENOTYP
HCV RNA Qnt(log copy/mL): UNDETERMINED log10 IU/mL
HepC Qn: NOT DETECTED IU/mL

## 2021-06-23 LAB — CBC
HCT: 24.4 % — ABNORMAL LOW (ref 39.0–52.0)
Hemoglobin: 8 g/dL — ABNORMAL LOW (ref 13.0–17.0)
MCH: 28.9 pg (ref 26.0–34.0)
MCHC: 32.8 g/dL (ref 30.0–36.0)
MCV: 88.1 fL (ref 80.0–100.0)
Platelets: 529 10*3/uL — ABNORMAL HIGH (ref 150–400)
RBC: 2.77 MIL/uL — ABNORMAL LOW (ref 4.22–5.81)
RDW: 14.6 % (ref 11.5–15.5)
WBC: 14.8 10*3/uL — ABNORMAL HIGH (ref 4.0–10.5)
nRBC: 0 % (ref 0.0–0.2)

## 2021-06-23 LAB — HEPATITIS B SURFACE ANTIBODY, QUANTITATIVE: Hep B S AB Quant (Post): 26.9 m[IU]/mL (ref >9.9)

## 2021-06-23 MED ORDER — POLYETHYLENE GLYCOL 3350 17 G PO PACK
17.0000 g | PACK | Freq: Two times a day (BID) | ORAL | Status: DC
  Administered 2021-06-23 – 2021-06-25 (×4): 17 g via ORAL
  Filled 2021-06-23 (×4): qty 1

## 2021-06-23 NOTE — Progress Notes (Signed)
Subjective: No acute events or concerns overnight. Patient reports improvement in abd pain and distension after fluid drained. Endorsing worst pain occurring prandially and improves post prandial. Still tolerating PO intake. Passing gas and having small regular BMs. Discussed Hep B exposure and need for testing and booster. Re-discussed concerns that fluid may continue to re accumulate and he may need serial drainage of peritoneum outpatient. Patient expressing SI during this discussion. Does not want to be transferred to adult inpatient psych.   Objective:  Vital signs in last 24 hours: Vitals:   06/23/21 1200 06/23/21 1230 06/23/21 1300 06/23/21 1311  BP: (!) 165/82 131/66 (!) 160/85 (!) 166/81  Pulse: 82 83 79 82  Resp: 18 18 18    Temp:      TempSrc:      SpO2:      Weight:      Height:       Physical Exam General: alert, appears stated age, in no acute distress Abdomen: Soft, distended, bowel sounds present, epigastric tenderness, +shifting dullness on percussion, pitting edema bilateral flanks  CV: regular rate and rhythm, likely bruit from AV fistula heard, nor rubs no gallops, trace LLE Pulm: Non labored breathing on room air, no wheezing or crackles.  Skin: Warm and dry Psych: less tearful affect  Assessment/Plan:  Principal Problem:   Major depressive disorder, single episode, severe ([place]) Active Problems:   ESRD on hemodialysis ([place])   Ileus ([place])   Protein-calorie malnutrition, severe   Bacterial peritonitis ([place])   Other ascites   Renal mass  Timothy [name] is a 60 yo M with PMHx of ESRD on HD TTS, colon cancer s/p resection 2014, HFpEF, HTN, and history of polysubstance use who was admitted for ileus and found to have bacterial peritonitis, mod volume ascites of unclear etiology, with incidental finding of renal mass, and development of SI during admission.   Bacterial peritonitis  Moderate to large volume abdominal ascites  Ileus, resolved S/p a recent  exploratory laparotomy with lysis of adhesions on 05/23/2021. Adhesions 2/2 bowel resection for colon cancer in 2014. He returned to [facility] on 8/27 for ongoing abdominal pain and ileus. His Ileus has since resolved but he was found to have ascites of unclear etiology, now s/p paracentesis x2. He was started on IV antibiotics for possible secondary bacterial peritonitis. Peritoneal fluid culture grew E Coli. Paracentesis 9/2 with 2.4L drained and fluid analysis with no organisms on gram stain and cultures with NGTD. Neutrophil count decreased from prior.  Normal total protein, albumin and glucose, with increased LDH. Cytology again negative for malignant cells. SAAG 0.3. Repeat CT abdomen/pelvis 9/2 showed moderate volume ascites and thick walled gallbladder (likely 2/2 infection/ascites).   Patient likely has secondary bacterial peritonitis though the source of his infection remains unclear. On IV antibiotics, patient's ascitic fluid WBC count improving. Unlikely that heart failure is contributing to fluid accumulation given ECHO on 9/5. We have also considered malignant ascites though less likely since he had no evidence metastatic disease on recent imaging. Ascites fluid Cr 5.7, urinary ascites unlikely. Possible HD related ascites (rare) vs post surgical complication (rare) causing ascites vs other unknown cause. Regardless of etiology of ascites, patient would be managed with serial drainage of ascites fluid outpatient. It is reassuring that only 1L fluid was obtained with para which may indicate fluid accumulation is slowing. May be delayed resolution of ascites 2/2 to bacterial peritonitis. HIDA scan without evidence of cholecystitis as secondary cause of bacterial peritonitis.  -General surgery  signed off, and no acute surgical intervention warranted at this time.  - GI signed off - Continue ceftriaxone day 9/10 - Pain control with oral dilaudid 2mg  q4hr and IV dilaudid 0.5 mg q3h prn for severe  breakthrough pain -Bowel regimen: senna, dulcolax, miralax -consider palliative care consult  Prandial Abdominal Pain Patient having abdominal pain when he eats meals. Pain improves post prandially. Would like to evaluate for mesenteric ischemia as component of abdominal pain initially thought to be due just to distension from ascites and resolved ileus.  -CTA ABD PEL on Saturday 9/10 before HD, ok'd with nephrology NP  Hemosiderosis Incidental Imaging Finding Renal MRI on 9/6 showed evidence of iron deposition in the liver and spleen. Patient receives IV iron during HD as part of treatment for anemia as well as Aranesp shot. Prior iron studies with Iron 77, no ferritin obtained at that time. Hgb down trending from 12.1 to 8.8 over last few days, now stable at ~8. Iron studies 9/6 showed iron 24, % sat 19, ferritin 1741. -Hematology recommends outpatient follow up, likely 2/2 to IV iron given during HD sessions. -Hold off on IV iron with HD  Suicidal ideations  Patient reported suicidal ideations and plan 9/2. He continues to note feeling depressed due to his persistent abdominal pain and financial issues regarding his social security. He continues to endorse SI. - Psychiatry consulted, appreciate recs  - Wellbutrin 150mg  daily - Suicide precautions + sitter - Psychiatry continues to recommend inpatient psychiatric admission after medically cleared since patient unable to contract for safety.   Normocytic Anemia Hx Anemia of CKD Patient's Hgb has downtrended from 12 to 8.8 over last 5 days. Now stable ~8. Patient receives darbepoetin shot with dialysis. Last iron profile 9/6  iron 24, % sat 19, ferritin 1741. Hx colon cancer, possible fecal occult blood loss. - FOBT pending  Hypoalbuminemia Patient's most recent albumin 1.6 in the setting of poor PO intake and ESRD. Gave albumin with 9/7 paracentesis. -Nutrition consult, appreciate recs  Renal mass likely proteinaceous/hemorrhagic  cyst Image findings suspicious for renal cell carcinoma vs proteinaceous or hemorrhagic renal cyst. Previously described splenic hypodensities no longer appreciated on repeat imaging, thus less likely malignant, MRI favoring benign lymphangioma or cyst. CT chest negative for any metastatic lesions. Renal MRI showed right renal lesion most consistent with hemorrhagic/proteinaceous cyst with hemosiderosis. - Urology consulted, appreciate recs  ESRD on HD  HD 9/3 however patient felt HD was contributing to his abdominal pain and requested it be stopped. Only ~100cc of UF was removed.  Labs obtained at that time with no significant electrolyte abnormality except for patient's persistently elevated BUN. On his T/Th/Sat schedule.  - Nephrology on board, appreciate recommendations: next HD 9/10 - more aggressive volume removal in HD given elevated Bps and trace LLE   HTN Blood pressures have been persistently elevated. - Continue diltiazem 360 mg daily - Oral hydralazine 25 mg TID - more aggressive volume removal in HD given elevated Bps and trace LLE   Prior Hep C (previously treated) Tested HCV reactive on 9/7. Likely reflective of prior infection. - HCV PCR pending  Diet: Renal / Carb modified VTE: lovenox IVF: none Code: Full  Prior to Admission Living Arrangement: Home Anticipated Discharge Location: Home Barriers to Discharge: Clinical improvement Dispo: Anticipated discharge pending clinical improvement.   Timothy Denis, MD 06/23/2021, 1:47 PM Pager: [phone] After 5pm on weekdays and 1pm on weekends: On Call pager [phone]

## 2021-06-23 NOTE — Progress Notes (Signed)
06/23/2021 10:07 AM  Spoke to patient regarding care concerns and assisted in service recovery.  Graclynn Vanantwerp MSN, RN-BC, CNML [facility] Phone: [phone]

## 2021-06-23 NOTE — Progress Notes (Signed)
Subjective: CC: Feels better after paracentesis yesterday. Less pressure/tightness on his abdomen. Has pain around his periumbilical abdomen. No current abdominal pain. No epigastric or ruq pain. Tolerating diet without n/v. Passing flatus and having bm's.   Objective: Vital signs in last 24 hours: Temp:  [97.6 F (36.4 C)-98.2 F (36.8 C)] 98.1 F (36.7 C) (09/08 0809) Pulse Rate:  [88-92] 88 (09/08 0809) Resp:  [18] 18 (09/08 0809) BP: (142-175)/(76-115) 157/76 (09/08 0809) SpO2:  [95 %-98 %] 98 % (09/08 0809) Last BM Date: 06/23/21  Intake/Output from previous day: 09/07 0701 - 09/08 0700 In: 417 [P.O.:180; NG/GT:237] Out: -  Intake/Output this shift: Total I/O In: 60 [P.O.:60] Out: -   PE: Gen:  Alert, NAD, pleasant Abd: Soft, mild distension with ascitics, tenderness of peri-umbilical/central abdomen. Otherwise NT. Midline wound healed Skin: no rashes noted, warm and dry  Lab Results:  Recent Labs    06/21/21 0909  WBC 16.1*  HGB 8.5*  HCT 24.8*  PLT 529*   BMET Recent Labs    06/21/21 0909  NA 127*  K 4.2  CL 92*  CO2 23  GLUCOSE 127*  BUN 78*  CREATININE 8.36*  CALCIUM 7.7*   PT/INR No results for input(s): LABPROT, INR in the last 72 hours. CMP     Component Value Date/Time   NA 127 (L) 06/21/2021 0909   K 4.2 06/21/2021 0909   CL 92 (L) 06/21/2021 0909   CO2 23 06/21/2021 0909   GLUCOSE 127 (H) 06/21/2021 0909   BUN 78 (H) 06/21/2021 0909   CREATININE 8.36 (H) 06/21/2021 0909   CREATININE 1.59 (H) 07/29/2015 0943   CALCIUM 7.7 (L) 06/21/2021 0909   CALCIUM 8.4 (L) 09/14/2016 1615   PROT 5.3 (L) 06/21/2021 0909   ALBUMIN 1.6 (L) 06/21/2021 0909   AST 18 06/21/2021 0909   ALT 15 06/21/2021 0909   ALKPHOS 67 06/21/2021 0909   BILITOT 0.6 06/21/2021 0909   GFRNONAA 7 (L) 06/21/2021 0909   GFRNONAA 48 (L) 07/29/2015 0943   GFRAA 7 (L) 05/18/2019 2220   GFRAA 56 (L) 07/29/2015 0943   Lipase     Component Value Date/Time    LIPASE 50 06/11/2021 1427    Studies/Results: MR ABDOMEN W WO CONTRAST  Result Date: 06/21/2021 CLINICAL DATA:  CT demonstrating a right renal mass. EXAM: MRI ABDOMEN WITHOUT AND WITH CONTRAST TECHNIQUE: Multiplanar multisequence MR imaging of the abdomen was performed both before and after the administration of intravenous contrast. CONTRAST:  5m GADAVIST GADOBUTROL 1 MMOL/ML IV SOLN COMPARISON:  CT of 06/17/2021 FINDINGS: Moderate to markedly motion degraded exam. Lower chest: Mild cardiomegaly.  Small left pleural effusion. Hepatobiliary: iron deposition within the liver. Hepatomegaly at 19.5 cm craniocaudal. Gallstones of up to 1.5 cm. No specific evidence of acute cholecystitis. No biliary duct dilatation. Pancreas: Borderline pancreatic duct dilatation, without cause identified. Spleen: A 1.7 cm cystic lesion within the superior aspect of the spleen is incompletely evaluated, but favored to represent a benign lymphangioma or cyst. No splenomegaly. Splenic iron deposition. Adrenals/Urinary Tract: Normal adrenal glands. Renal atrophy with numerous tiny renal cysts, likely related to dialysis. Corresponding to the CT abnormality, within the anterior interpolar right kidney, is a 2.6 cm lesion which demonstrates peripheral T1-T2 hypointensity and central precontrast T1 hyperintensity, including on 75/16. After contrast, no enhancement identified. The subtracted images are nondiagnostic secondary to motion. No hydronephrosis. Stomach/Bowel: Grossly normal stomach. Large and small bowel loops are prominent and gas-filled. Example  small bowel loop at up to 4.3 cm within the left side of the abdomen on 28/8. Vascular/Lymphatic: Normal aortic caliber. Aortic atherosclerosis. No gross abdominal adenopathy. Other:  Moderate volume ascites. Musculoskeletal: No acute osseous abnormality. IMPRESSION: 1. Moderate to markedly motion degraded exam. 2. The right renal lesion is most consistent with a  hemorrhagic/proteinaceous cyst. 3. Hemosiderosis. 4. Ascites and left pleural effusion, suggesting fluid overload. 5. Cholelithiasis 6.  Aortic Atherosclerosis (ICD10-I70.0). 7. Mildly distended large and small bowel, favoring adynamic ileus. Suboptimally evaluated. Electronically Signed   By: Abigail Miyamoto M.D.   On: 06/21/2021 12:04   NM Hepatobiliary Liver Func  Result Date: 06/22/2021 CLINICAL DATA:  Chronic upper abdominal pain, assess gallbladder motility. EXAM: NUCLEAR MEDICINE HEPATOBILIARY IMAGING WITH GALLBLADDER EF TECHNIQUE: Sequential images of the abdomen were obtained out to 36 minutes following intravenous administration of radiopharmaceutical. Patient refused further imaging after 36 minutes of sequential scanning as such this is an incomplete study and evaluation for gallbladder motility/ejection fraction could not be obtained. RADIOPHARMACEUTICALS:  5.4 mCi Tc-9m Choletec IV COMPARISON:  MRI June 21, 2021. FINDINGS: Prompt uptake and biliary excretion of activity by the liver is seen. Gallbladder activity is visualized, consistent with patency of cystic duct. Biliary activity passes into small bowel, consistent with patent common bile duct. Patient refused to continue study after 36 minutes of sequential imaging, as such gallbladder motility could not be evaluated. IMPRESSION: 1.  Patent cystic and common bile ducts. 2. No evaluation of ejection fraction given patient's refusal to continue study. Electronically Signed   By: JDahlia BailiffM.D.   On: 06/22/2021 15:53   IR Paracentesis  Result Date: 06/22/2021 INDICATION: Patient with history of end-stage renal disease with recurrent ascites. Request for therapeutic and diagnostic paracentesis EXAM: ULTRASOUND GUIDED THERAPEUTIC AND DIAGNOSTIC PARACENTESIS MEDICATIONS: Lidocaine 1% 10 mL COMPLICATIONS: None immediate. PROCEDURE: Informed written consent was obtained from the patient after a discussion of the risks, benefits and  alternatives to treatment. A timeout was performed prior to the initiation of the procedure. Initial ultrasound scanning demonstrates a small amount of ascites within the left lower abdominal quadrant. The left lower abdomen was prepped and draped in the usual sterile fashion. 1% lidocaine was used for local anesthesia. Following this, a 19 gauge, 7-cm, Yueh catheter was introduced. An ultrasound image was saved for documentation purposes. The paracentesis was performed. The catheter was removed and a dressing was applied. The patient tolerated the procedure well without immediate post procedural complication. FINDINGS: A total of approximately 1 L of straw-colored fluid was removed. Samples were sent to the laboratory as requested by the clinical team. IMPRESSION: Successful ultrasound-guided therapeutic and diagnostic paracentesis yielding 1 liters of peritoneal fluid. Read by: JRushie Nyhan NP Electronically Signed   By: JSandi MariscalM.D.   On: 06/22/2021 13:34    Anti-infectives: Anti-infectives (From admission, onward)    Start     Dose/Rate Route Frequency Ordered Stop   06/16/21 2000  cefoTAXime (CLAFORAN) 2 g in dextrose 5 % 50 mL IVPB  Status:  Discontinued        2 g 140 mL/hr over 30 Minutes Intravenous Every 24 hours 06/15/21 1344 06/16/21 0858   06/16/21 1700  cefTRIAXone (ROCEPHIN) 2 g in sodium chloride 0.9 % 100 mL IVPB        2 g 200 mL/hr over 30 Minutes Intravenous Every 24 hours 06/16/21 0858 06/24/21 2359   06/15/21 1430  cefoTAXime (CLAFORAN) 2 g in dextrose 5 % 50 mL IVPB  2 g 140 mL/hr over 30 Minutes Intravenous  Once 06/15/21 1344 06/15/21 1749        Assessment/Plan Hx exploratory laparotomy/ adhesiolysis 05/23/21 for small bowel obstruction - Dr. Donne Hazel S/p previous right hemicolectomy for colon cancer - No radiographic or clinical evidence of sbo. Diet as tolerated.   Cholelithiasis - HIDA negative. No evidence of cholecystitis.   Large volume  ascites - s/p paracentesis 8/29. Paracentesis fluid with E. Coli and Cytology with mesothelial cells. S/p repeat paracentesis 9/2. Cx pending with no current organisms. Repeat Paracentesis 9/7. Cx pending. On abx. IM continuing to work this up. Unclear etiology but does not appear to be 2/2 his GB as HIDA was negative. Will defer further workup to IM. We will sign off. Please call back if we can be of any further assistance.      FEN: Renal ID: Rocephin VTE: Lovenox    - Per IM -  ESRD on HD Renal mass 3.4 cm - Urology following. Renal MRI c/w hemorrhagic/proteinaceous cyst. 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 Sitter at bedside   LOS: 11 days    Jillyn Ledger , [facility] Surgery 06/23/2021, 8:39 AM Please see Amion for pager number during day hours 7:00am-4:30pm

## 2021-06-23 NOTE — Progress Notes (Signed)
PT Cancellation Note  Patient Details Name: Timothy [name] MRN: [id number] DOB: [birth year]   Cancelled Treatment:    Reason Eval/Treat Not Completed: Patient at procedure or test/unavailable. Patient in HD currently. Will re-attempt later as time allows.    [name] 06/23/2021, 12:52 PM

## 2021-06-23 NOTE — Progress Notes (Signed)
[facility] Progress Note   Subjective:    Seen and examined patient during HD. No complaints. Currently tolerating UF.  Objective Vitals:   06/22/21 2125 06/23/21 0500 06/23/21 0805 06/23/21 0809  BP: (!) 142/95 (!) 175/115 (!) 157/89 (!) 157/76  Pulse: 90 92 83 88  Resp: 18 18 16 18   Temp: 97.6 F (36.4 C) 98.2 F (36.8 C) 98.8 F (37.1 C) 98.1 F (36.7 C)  TempSrc: Oral Oral Oral Axillary  SpO2: 97% 95% 100% 98%  Weight:      Height:       Physical Exam General: Well-appearing male; NAD; on RA Heart: S1 and S2; No murmurs, gallops, or rales Lungs: CTA anteriorly Abdomen: Soft and round; distention improving Extremities: pedal edema improving, no edema BLLE Dialysis Access: L AVF (+) bruit/thrill   Filed Weights   06/18/21 0900 06/21/21 1316 06/21/21 1705  Weight: 66.5 kg 70.3 kg 69 kg    Intake/Output Summary (Last 24 hours) at 06/23/2021 1034 Last data filed at 06/23/2021 0757 Gross per 24 hour  Intake 477 ml  Output --  Net 477 ml    Additional Objective Labs: Basic Metabolic Panel: Recent Labs  Lab 06/18/21 0739 06/21/21 0909  NA 131* 127*  K 3.8 4.2  CL 94* 92*  CO2 26 23  GLUCOSE 118* 127*  BUN 63* 78*  CREATININE 6.12* 8.36*  CALCIUM 8.0* 7.7*  PHOS 3.6  --    Liver Function Tests: Recent Labs  Lab 06/17/21 1738 06/18/21 0739 06/21/21 0909  AST  --   --  18  ALT  --   --  15  ALKPHOS  --   --  67  BILITOT  --   --  0.6  PROT 5.6*  --  5.3*  ALBUMIN  --  1.7* 1.6*   No results for input(s): LIPASE, AMYLASE in the last 168 hours. CBC: Recent Labs  Lab 06/18/21 0739 06/21/21 0909  WBC 11.5* 16.1*  HGB 8.8* 8.5*  HCT 26.4* 24.8*  MCV 86.8 86.1  PLT 381 529*   Blood Culture    Component Value Date/Time   SDES PERITONEAL FLUID 06/22/2021 1236   SPECREQUEST ABDOMEN 06/22/2021 1236   CULT  06/22/2021 1236    NO GROWTH < 24 HOURS Performed at [facility], [place] [address], [place], [place] [address]     REPTSTATUS PENDING 06/22/2021 1236    Cardiac Enzymes: No results for input(s): CKTOTAL, CKMB, CKMBINDEX, TROPONINI in the last 168 hours. CBG: No results for input(s): GLUCAP in the last 168 hours. Iron Studies:  Recent Labs    06/21/21 0909  IRON 24*  TIBC 127*  FERRITIN 1,741*   Lab Results  Component Value Date   INR 1.11 01/15/2018   INR 1.02 01/13/2018   Studies/Results: NM Hepatobiliary Liver Func  Result Date: 06/22/2021 CLINICAL DATA:  Chronic upper abdominal pain, assess gallbladder motility. EXAM: NUCLEAR MEDICINE HEPATOBILIARY IMAGING WITH GALLBLADDER EF TECHNIQUE: Sequential images of the abdomen were obtained out to 36 minutes following intravenous administration of radiopharmaceutical. Patient refused further imaging after 36 minutes of sequential scanning as such this is an incomplete study and evaluation for gallbladder motility/ejection fraction could not be obtained. RADIOPHARMACEUTICALS:  5.4 mCi Tc-58m  Choletec IV COMPARISON:  MRI June 21, 2021. FINDINGS: Prompt uptake and biliary excretion of activity by the liver is seen. Gallbladder activity is visualized, consistent with patency of cystic duct. Biliary activity passes into small bowel, consistent with patent common bile duct.  Patient refused to continue study after 36 minutes of sequential imaging, as such gallbladder motility could not be evaluated. IMPRESSION: 1.  Patent cystic and common bile ducts. 2. No evaluation of ejection fraction given patient's refusal to continue study. Electronically Signed   By: Dahlia Bailiff M.D.   On: 06/22/2021 15:53   IR Paracentesis  Result Date: 06/22/2021 INDICATION: Patient with history of end-stage renal disease with recurrent ascites. Request for therapeutic and diagnostic paracentesis EXAM: ULTRASOUND GUIDED THERAPEUTIC AND DIAGNOSTIC PARACENTESIS MEDICATIONS: Lidocaine 1% 10 mL COMPLICATIONS: None immediate. PROCEDURE: Informed written consent was obtained from the  patient after a discussion of the risks, benefits and alternatives to treatment. A timeout was performed prior to the initiation of the procedure. Initial ultrasound scanning demonstrates a small amount of ascites within the left lower abdominal quadrant. The left lower abdomen was prepped and draped in the usual sterile fashion. 1% lidocaine was used for local anesthesia. Following this, a 19 gauge, 7-cm, Yueh catheter was introduced. An ultrasound image was saved for documentation purposes. The paracentesis was performed. The catheter was removed and a dressing was applied. The patient tolerated the procedure well without immediate post procedural complication. FINDINGS: A total of approximately 1 L of straw-colored fluid was removed. Samples were sent to the laboratory as requested by the clinical team. IMPRESSION: Successful ultrasound-guided therapeutic and diagnostic paracentesis yielding 1 liters of peritoneal fluid. Read by: Rushie Nyhan, NP Electronically Signed   By: Sandi Mariscal M.D.   On: 06/22/2021 13:34    Medications:  cefTRIAXone (ROCEPHIN)  IV 2 g (06/22/21 1639)    (feeding supplement) PROSource Plus  30 mL Oral BID BM   bisacodyl  10 mg Rectal Daily   buPROPion  150 mg Oral Daily   Chlorhexidine Gluconate Cloth  6 each Topical Q0600   darbepoetin (ARANESP) injection - DIALYSIS  40 mcg Intravenous Q Thu-HD   diltiazem  360 mg Oral Daily   enoxaparin (LOVENOX) injection  30 mg Subcutaneous Q24H   feeding supplement (NEPRO CARB STEADY)  237 mL Oral TID BM   ferric citrate  420 mg Oral TID WC   hydrALAZINE  25 mg Oral TID   lidocaine  1 patch Transdermal Q24H   multivitamin  1 tablet Oral QHS   ondansetron (ZOFRAN) IV  4 mg Intravenous Once   pantoprazole  20 mg Oral Daily   polyethylene glycol  17 g Oral Daily   senna  1 tablet Oral Daily    Dialysis Orders: TTS at [facility] 3:45 min 180NRe 450/Autoflow 1.5 2.0K/2.0 Ca 65 kgs  UFP 4 AVF - No heparin - Mircera 50 mcg IV q 4  weeks (Last dose 05/12/2021)   Assessment/Plan: Ileus: Resolving. Diet has been advanced E.Coli peritonitis/ new ascites: Cx 8/29 +. On Ceftriaxone. Surgery following for GB sludge/wall thickening, no surgical plans. Paracentesis on 9/2 with improving cell count (although still high), Cx NGTD. Repeat para on 9/6-1L removed-cx with gram stain (-) so far-awaiting final result. Per previous note:Nephrogenic ascites is not really a diagnosis, but vol excess in general can contribute to ascites in some ESRD pts. See #7 below  Recent SBO/ sp exlap/ LOA - on 05/23/21 ESRD: HD TTS. On HD-tolerating UFG 4.5L. Anemia: Hgb 8.5, continue Aranesp q Thursday. Holding IV iron for now. Monitor trends, may need to raise dose. CBC in AM Secondary hyperparathyroidism: Ca ok, Phos much improved - continue Auryxia as binder. RFP in AM HTN/volume: BP elevated, up 3-4kg and he prob has lost  a few kg. Will plan to lower volume more aggressively as tolerates w/ next few HD sessions.  Nutrition: Alb 1.7 (low), continue protein supplements. R renal mass: Needs OP follow-up. Hep C  Tobie Poet, NP [facility] 06/23/2021,10:34 AM  LOS: 11 days

## 2021-06-23 NOTE — Progress Notes (Addendum)
PIV consult: Received consult with comment "20g for CT angio per CT staff." Arrived to room, pt stated he knew nothing about CT. Viewed order and MD notes which indicated scan was to be done prior to HD on 9/10. Will wait until scan is due to restart PIV. RN made aware.

## 2021-06-23 NOTE — Progress Notes (Addendum)
Patient would like to talk to the attending MD tomorrow, 06/24/21, since he wanted to transfer to [facility] for a second opinion of his illness.    The required 20G IV site for CT Angio study will be put on hold until final discussion between patient and attending MD tomorrow.

## 2021-06-23 NOTE — Progress Notes (Signed)
Physical Therapy Treatment Patient Details Name: Timothy [name] MRN: [id number] DOB: 10-10-1961 Today's Date: 06/23/2021    History of Present Illness Pt is a 60 yo male who presented to [facility] on 06/11/21 with complaints of abdominal pain, constipation, and swelling. Work up indicating Ileus.  Pt with large volume ascites s/p paracentesis 8/29, fluid with E coli and cystology with mesothelial cells. Other dx include colelithiasis. 9/3, psychiatry following pt for active SI.  PMH: ESRD with HD on TTS, hx of colon cancer, CHF, polysubstance abuse, exploratory laparotomy/ adhesiolysis 05/23/21 for small bowel obstruction.    PT Comments    Patient received in bed, talking on phone, sitter present in room. He states "do you want to walk down and get some tea with me?" Patient is independent with bed mobility, supervision for transfers. Min guard/assist for ambulation although does want me hanging on to him. Staggering left and right with ambulation. Patient is not receptive to cues or assist with mobility. He is at increased fall risk. Patient will continue to benefit from skilled PT while here to improve strength for improved safety.     Follow Up Recommendations  No PT follow up     Equipment Recommendations  None recommended by PT    Recommendations for Other Services       Precautions / Restrictions Precautions Precautions: Fall Precaution Comments: very unsteady on his feet Restrictions Weight Bearing Restrictions: No    Mobility  Bed Mobility Overal bed mobility: Independent Bed Mobility: Supine to Sit     Supine to sit: Independent          Transfers Overall transfer level: Needs assistance Equipment used: None Transfers: Sit to/from Stand Sit to Stand: Supervision            Ambulation/Gait Ambulation/Gait assistance: Min guard Gait Distance (Feet): 150 Feet Assistive device: None Gait Pattern/deviations: Staggering left;Staggering right;Step-through  pattern Gait velocity: WFL   General Gait Details: Pt with staggering gait pattern reching for hallway rail at times for support, supervision to min guard assist.  Staggering is in both directions. Does not like me holding onto him while walking.   Stairs             Wheelchair Mobility    Modified Rankin (Stroke Patients Only)       Balance Overall balance assessment: Needs assistance Sitting-balance support: Feet supported Sitting balance-Leahy Scale: Good     Standing balance support: No upper extremity supported;During functional activity Standing balance-Leahy Scale: Poor Standing balance comment: patient unsteady wtih ambulation in hallway                            Cognition Arousal/Alertness: Awake/alert Behavior During Therapy: Agitated Overall Cognitive Status: No family/caregiver present to determine baseline cognitive functioning                                 General Comments: Sitter in room. Patient irritated that apparently MDs told him there is nothing else they can do for him. He is talking about going to [facility] after discharge from here.      Exercises      General Comments        Pertinent Vitals/Pain Pain Assessment: No/denies pain    Home Living                      Prior Function  PT Goals (current goals can now be found in the care plan section) Acute Rehab PT Goals Patient Stated Goal: to leave and go to [facility] PT Goal Formulation: With patient Time For Goal Achievement: 06/27/21 Potential to Achieve Goals: Fair Progress towards PT goals: Not progressing toward goals - comment (patient is not open to suggestions regarding safety)    Frequency    Min 3X/week      PT Plan Current plan remains appropriate    Co-evaluation              AM-PAC PT "6 Clicks" Mobility   Outcome Measure  Help needed turning from your back to your side while in a flat bed without  using bedrails?: None Help needed moving from lying on your back to sitting on the side of a flat bed without using bedrails?: None Help needed moving to and from a bed to a chair (including a wheelchair)?: A Little Help needed standing up from a chair using your arms (e.g., wheelchair or bedside chair)?: A Little Help needed to walk in hospital room?: A Little Help needed climbing 3-5 steps with a railing? : A Little 6 Click Score: 20    End of Session   Activity Tolerance: Patient tolerated treatment well Patient left: in bed;with nursing/sitter in room Nurse Communication: Mobility status;Precautions PT Visit Diagnosis: Muscle weakness (generalized) (M62.81);Unsteadiness on feet (R26.81)     Time: 2449-7530 PT Time Calculation (min) (ACUTE ONLY): 8 min  Charges:  $Gait Training: 8-22 mins                    Lynda Wanninger, PT, GCS 06/23/21,2:32 PM

## 2021-06-24 DIAGNOSIS — K659 Peritonitis, unspecified: Secondary | ICD-10-CM | POA: Diagnosis not present

## 2021-06-24 DIAGNOSIS — Z992 Dependence on renal dialysis: Secondary | ICD-10-CM | POA: Diagnosis not present

## 2021-06-24 DIAGNOSIS — N186 End stage renal disease: Secondary | ICD-10-CM | POA: Diagnosis not present

## 2021-06-24 DIAGNOSIS — F322 Major depressive disorder, single episode, severe without psychotic features: Secondary | ICD-10-CM | POA: Diagnosis not present

## 2021-06-24 LAB — PATHOLOGIST SMEAR REVIEW: Path Review: 9082022

## 2021-06-24 MED ORDER — DARBEPOETIN ALFA 60 MCG/0.3ML IJ SOSY: 60.0000 ug | PREFILLED_SYRINGE | INTRAMUSCULAR | Status: DC

## 2021-06-24 NOTE — Progress Notes (Signed)
Occupational Therapy Treatment Patient Details Name: Timothy [name] MRN: [id number] DOB: 1961/10/01 Today's Date: 06/24/2021    History of present illness Pt is a 60 yo male who presented to [facility] on 06/11/21 with complaints of abdominal pain, constipation, and swelling. Work up indicating Ileus.  Pt with large volume ascites s/p paracentesis 8/29, fluid with E coli and cystology with mesothelial cells. Other dx include colelithiasis. 9/3, psychiatry following pt for active SI.  PMH: ESRD with HD on TTS, hx of colon cancer, CHF, polysubstance abuse, exploratory laparotomy/ adhesiolysis 05/23/21 for small bowel obstruction.   OT comments  Pt. Seen for skilled OT treatment session.  Pt. Able to demonstrate LB dressing, and simulated grooming and toileting tasks with min guard a/min a.  Pt. With notable lob during ambulation, intermittent furniture walking.    Follow Up Recommendations  No OT follow up;Supervision - Intermittent    Equipment Recommendations  Other (comment)    Recommendations for Other Services      Precautions / Restrictions Precautions Precautions: Fall Precaution Comments: very unsteady on his feet       Mobility Bed Mobility Overal bed mobility: Independent                  Transfers Overall transfer level: Needs assistance Equipment used: None Transfers: Sit to/from [employer] Transfers Sit to Stand: Supervision Stand pivot transfers: Min guard       General transfer comment: notable unsteadiness on feet but stated he does not like being "held onto" agreeable to let me hold his shirt.    Balance                                           ADL either performed or assessed with clinical judgement   ADL Overall ADL's : Needs assistance/impaired     Grooming: Min guard;Standing Grooming Details (indicate cue type and reason): simulated while standing at sink             Lower Body Dressing: Sitting/lateral leans;Sit  to/from stand;Min guard Lower Body Dressing Details (indicate cue type and reason): able to sit and cross each leg over knee Toilet Transfer: Minimal assistance;Ambulation Toilet Transfer Details (indicate cue type and reason): simulated, walked into r.room but did not actually have to use the b.room         Functional mobility during ADLs: Minimal assistance General ADL Comments: educated on safe items to hold onto during furniture walking in conjunction with energy conservation strategies.  pt. reports he sits for meal prep and also has chair he uses during bathing and some grooming tasks.     Vision       Perception     Praxis      Cognition Arousal/Alertness: Awake/alert Behavior During Therapy: WFL for tasks assessed/performed Overall Cognitive Status: Within Functional Limits for tasks assessed                                          Exercises     Shoulder Instructions       General Comments  Expressed sadness over losing his twin sister this past July.  Wears a necklace with her picture on it    Pertinent Vitals/ Pain       Pain Assessment: No/denies pain  Home Living  Prior Functioning/Environment              Frequency  Min 2X/week        Progress Toward Goals  OT Goals(current goals can now be found in the care plan section)  Progress towards OT goals: Progressing toward goals     Plan Discharge plan remains appropriate;Frequency remains appropriate    Co-evaluation                 AM-PAC OT "6 Clicks" Daily Activity     Outcome Measure   Help from another person eating meals?: None Help from another person taking care of personal grooming?: A Little Help from another person toileting, which includes using toliet, bedpan, or urinal?: A Little Help from another person bathing (including washing, rinsing, drying)?: A Little Help from another person to put  on and taking off regular upper body clothing?: A Little Help from another person to put on and taking off regular lower body clothing?: A Little 6 Click Score: 19    End of Session    OT Visit Diagnosis: Unsteadiness on feet (R26.81);Other abnormalities of gait and mobility (R26.89);Muscle weakness (generalized) (M62.81)   Activity Tolerance Patient tolerated treatment well   Patient Left in bed;with nursing/sitter in room   Nurse Communication          Time: 4935-5217 OT Time Calculation (min): 9 min  Charges: OT General Charges $OT Visit: 1 Visit OT Treatments $Self Care/Home Management : 8-22 mins  Sonia Baller, COTA/L Acute Rehabilitation [phone]    Tanya Nones 06/24/2021, 11:36 AM

## 2021-06-24 NOTE — Progress Notes (Signed)
[facility] Progress Note   Subjective:    Seen and examined at bedside today. Tolerated yesterday's HD with net UF 3.5L. Patient reports wanting to be transferred to [facility] for a second opinion on his health.  Objective Vitals:   06/23/21 1316 06/23/21 1403 06/23/21 2026 06/24/21 0407  BP: (!) 157/87 (!) 165/95 (!) 163/74 (!) 167/90  Pulse: 83 91 93 92  Resp: 18 19 18 18   Temp:  98.2 F (36.8 C) 98.2 F (36.8 C) 99.6 F (37.6 C)  TempSrc:  Oral Oral Oral  SpO2:  95% 97% 97%  Weight:      Height:       Physical Exam General: NAD; on RA Heart: S1 and S2; No murmurs, gallops, or rales Lungs: CTA anteriorly Abdomen: Soft and round; distention improving Extremities: trace pedal edema, no edema BLLE Dialysis Access: L AVF (+) bruit/thrill   Filed Weights   06/21/21 1316 06/21/21 1705 06/23/21 0923  Weight: 70.3 kg 69 kg 69.2 kg    Intake/Output Summary (Last 24 hours) at 06/24/2021 0951 Last data filed at 06/24/2021 0500 Gross per 24 hour  Intake 657 ml  Output 3535 ml  Net -2878 ml    Additional Objective Labs: Basic Metabolic Panel: Recent Labs  Lab 06/18/21 0739 06/21/21 0909  NA 131* 127*  K 3.8 4.2  CL 94* 92*  CO2 26 23  GLUCOSE 118* 127*  BUN 63* 78*  CREATININE 6.12* 8.36*  CALCIUM 8.0* 7.7*  PHOS 3.6  --    Liver Function Tests: Recent Labs  Lab 06/17/21 1738 06/18/21 0739 06/21/21 0909  AST  --   --  18  ALT  --   --  15  ALKPHOS  --   --  67  BILITOT  --   --  0.6  PROT 5.6*  --  5.3*  ALBUMIN  --  1.7* 1.6*   No results for input(s): LIPASE, AMYLASE in the last 168 hours. CBC: Recent Labs  Lab 06/18/21 0739 06/21/21 0909 06/23/21 0800  WBC 11.5* 16.1* 14.8*  HGB 8.8* 8.5* 8.0*  HCT 26.4* 24.8* 24.4*  MCV 86.8 86.1 88.1  PLT 381 529* 529*   Blood Culture    Component Value Date/Time   SDES PERITONEAL FLUID 06/22/2021 1236   SPECREQUEST ABDOMEN 06/22/2021 1236   CULT  06/22/2021 1236    NO GROWTH 2  DAYS Performed at [facility], [place] [address] [place], [place] [address]    REPTSTATUS PENDING 06/22/2021 1236    Cardiac Enzymes: No results for input(s): CKTOTAL, CKMB, CKMBINDEX, TROPONINI in the last 168 hours. CBG: No results for input(s): GLUCAP in the last 168 hours. Iron Studies: No results for input(s): IRON, TIBC, TRANSFERRIN, FERRITIN in the last 72 hours. Lab Results  Component Value Date   INR 1.11 01/15/2018   INR 1.02 01/13/2018   Studies/Results: NM Hepatobiliary Liver Func  Result Date: 06/22/2021 CLINICAL DATA:  Chronic upper abdominal pain, assess gallbladder motility. EXAM: NUCLEAR MEDICINE HEPATOBILIARY IMAGING WITH GALLBLADDER EF TECHNIQUE: Sequential images of the abdomen were obtained out to 36 minutes following intravenous administration of radiopharmaceutical. Patient refused further imaging after 36 minutes of sequential scanning as such this is an incomplete study and evaluation for gallbladder motility/ejection fraction could not be obtained. RADIOPHARMACEUTICALS:  5.4 mCi Tc-50m  Choletec IV COMPARISON:  MRI June 21, 2021. FINDINGS: Prompt uptake and biliary excretion of activity by the liver is seen. Gallbladder activity is visualized, consistent with patency of  cystic duct. Biliary activity passes into small bowel, consistent with patent common bile duct. Patient refused to continue study after 36 minutes of sequential imaging, as such gallbladder motility could not be evaluated. IMPRESSION: 1.  Patent cystic and common bile ducts. 2. No evaluation of ejection fraction given patient's refusal to continue study. Electronically Signed   By: Dahlia Bailiff M.D.   On: 06/22/2021 15:53   IR Paracentesis  Result Date: 06/22/2021 INDICATION: Patient with history of end-stage renal disease with recurrent ascites. Request for therapeutic and diagnostic paracentesis EXAM: ULTRASOUND GUIDED THERAPEUTIC AND DIAGNOSTIC PARACENTESIS MEDICATIONS: Lidocaine 1% 10 mL  COMPLICATIONS: None immediate. PROCEDURE: Informed written consent was obtained from the patient after a discussion of the risks, benefits and alternatives to treatment. A timeout was performed prior to the initiation of the procedure. Initial ultrasound scanning demonstrates a small amount of ascites within the left lower abdominal quadrant. The left lower abdomen was prepped and draped in the usual sterile fashion. 1% lidocaine was used for local anesthesia. Following this, a 19 gauge, 7-cm, Yueh catheter was introduced. An ultrasound image was saved for documentation purposes. The paracentesis was performed. The catheter was removed and a dressing was applied. The patient tolerated the procedure well without immediate post procedural complication. FINDINGS: A total of approximately 1 L of straw-colored fluid was removed. Samples were sent to the laboratory as requested by the clinical team. IMPRESSION: Successful ultrasound-guided therapeutic and diagnostic paracentesis yielding 1 liters of peritoneal fluid. Read by: Rushie Nyhan, NP Electronically Signed   By: Sandi Mariscal M.D.   On: 06/22/2021 13:34    Medications:  cefTRIAXone (ROCEPHIN)  IV 2 g (06/23/21 1626)    (feeding supplement) PROSource Plus  30 mL Oral BID BM   bisacodyl  10 mg Rectal Daily   buPROPion  150 mg Oral Daily   Chlorhexidine Gluconate Cloth  6 each Topical Q0600   darbepoetin (ARANESP) injection - DIALYSIS  40 mcg Intravenous Q Thu-HD   diltiazem  360 mg Oral Daily   enoxaparin (LOVENOX) injection  30 mg Subcutaneous Q24H   feeding supplement (NEPRO CARB STEADY)  237 mL Oral TID BM   ferric citrate  420 mg Oral TID WC   hydrALAZINE  25 mg Oral TID   lidocaine  1 patch Transdermal Q24H   multivitamin  1 tablet Oral QHS   ondansetron (ZOFRAN) IV  4 mg Intravenous Once   pantoprazole  20 mg Oral Daily   polyethylene glycol  17 g Oral BID   senna  1 tablet Oral Daily    Dialysis Orders: TTS at [facility] 3:45 min 180NRe  450/Autoflow 1.5 2.0K/2.0 Ca 65 kgs  UFP 4 AVF - No heparin - Mircera 50 mcg IV q 4 weeks (Last dose 05/12/2021)   Assessment/Plan: Ileus: Resolving. Diet has been advanced E Coli peritonitis/ new ascites: Cx 8/29 +. On Ceftriaxone. Surgery following for GB sludge/wall thickening, no surgical plans. Paracentesis on 9/2 with improving cell count (although still high), Cx NGTD. Repeat para on 9/6-1L removed-cx with gram stain (-) so far-awaiting final result. Per previous note:Nephrogenic ascites is not really a diagnosis, but vol excess in general can contribute to ascites in some ESRD pts. Per primary, planning for CTA Recent SBO/ sp exlap/ LOA - on 05/23/21. Per primary: planning for CTA for w/u prandial ABD pain for mesenteric ischemia ESRD: HD TTS. Tolerated yesterday's HD with net UF 3.5L. Plan for HD 9/10. Anemia: Hgb trending downward-now 8.0, continue Aranesp q Thursday-will raise dose  today. Holding IV iron for now. Monitor trends Secondary hyperparathyroidism: Ca ok, Phos much improved - continue Auryxia as binder. RFP in AM HTN/volume: BP elevated, up 3-4kg and he prob has lost a few kg. Push UF-plan UFG 4.5-5L for tomorrow's HD  Nutrition: Alb 1.7 (low), continue protein supplements. R renal mass: Needs OP follow-up. Hep C Suicidal Ideation: patient with sitter, patient expressed suicidal ideation during discussion with primary yesterday. Psych consulted  Tobie Poet, NP [facility] 06/24/2021,9:51 AM  LOS: 12 days

## 2021-06-24 NOTE — Consult Note (Addendum)
[facility] Psychiatry Consult   Reason for Consult: Suicidal ideation Referring Physician: Dr. Faye Ramsay Patient Identification: Timothy [name] MRN:  [id number] Principal Diagnosis: Major depressive disorder, single episode, severe ([place]) Diagnosis:  Principal Problem:   Major depressive disorder, single episode, severe ([place]) Active Problems:   ESRD on hemodialysis ([place])   Ileus ([place])   Protein-calorie malnutrition, severe   Bacterial peritonitis ([place])   Other ascites   Renal mass   Abdominal pain   Total Time spent with patient: 30 minutes  Subjective:   Timothy [name] is a 60 y.o. male patient was seen and evaluated face-to-face.  He is alert and oriented, x 4. He is calm, cooperative, and pleasant with this Probation officer. He tells me that he feels much better, and is appreciative of the services he has received. HE reports he is now ready to go, he plans to seek services at [facility]. At present he denies any suicidal ideations, suicidal thoughts and or self harm behaviors.   He states his suicidal ideations were increased due to his pain. "When Im in pain I say a bunch of unnecessary shit. I have angry thoughts cause of pain. " He states he has never attempted suicide in the past, and had no intent when he made those statements early in the week. He denies any current suicidal ideations, homicidal ideations, and or hallucinations. Discussed psychiatry will follow-up for mood improvement/stabilization.  Patient was receptive to plan  HPI: Per admission assessment note "Timothy [name] is a 59 y.o. male with multiple medical comorbidities and an incidentally identified indeterminate right renal mass with features concerning for solid neoplasm.  Currently being treated for ileus.  History of ESRD and currently on hemodialysis.  He has a complex surgical history he is status post colectomy in 2014 as well as ex lap on 05/23/2021 with lysis of adhesions.  No personal/family  history of GU malignancies.  He denies any recent episodes of gross hematuria.  He previously smoked approximately half a pack of cigarettes for several years, but states that he quit approximately 3 years ago.  He currently smokes marijuana almost on a daily basis and does heroin periodically."  Past Psychiatric History: Denies  Risk to Self:   Denies Risk to Others:   Denies Prior Inpatient Therapy:   Denies Prior Outpatient Therapy:   Denies, sees his primary at [facility].   Past Medical History:  Past Medical History:  Diagnosis Date   Anemia of chronic kidney failure    BPH (benign prostatic hyperplasia)    Colon cancer ([place]) 2014   End stage renal disease on dialysis ([place]) 2017   Hypertension    Hypertensive heart disease with chronic diastolic congestive heart failure ([place]) 07/17/2016   Polysubstance abuse ([place])    History of heroin and marijuana use    Past Surgical History:  Procedure Laterality Date   ABDOMINAL SURGERY     AV FISTULA PLACEMENT Left 09/19/2016   Procedure: Left arm Radiocephalic ARTERIOVENOUS (AV) FISTULA CREATION;  Surgeon: Conrad [place], MD;  Location: [place];  Service: Vascular;  Laterality: Left;   BASCILIC VEIN TRANSPOSITION Left 07/09/2017   Procedure: BRACHIOCEPHALIC FISTULA CREATION;  Surgeon: Conrad Eunice, MD;  Location: [place];  Service: Vascular;  Laterality: Left;   COLON SURGERY  2014   INSERTION OF DIALYSIS CATHETER N/A 09/19/2016   Procedure: INSERTION OF TUNNELED DIALYSIS CATHETER;  Surgeon: Conrad Chok[place]os[place], MD;  Location: [place];  Service: Vascular;  Laterality: N/A;  IR GENERIC HISTORICAL  09/14/2016   IR US GUIDE VASC ACCESS RIGHT 09/14/2016 [name], DO [facility]-INTERV RAD   IR GENERIC HISTORICAL  09/14/2016   IR FLUORO GUIDE CV LINE RIGHT 09/14/2016 [name], DO [facility]   IR PARACENTESIS  06/22/2021   LAPAROTOMY N/A 05/23/2021   Procedure: EXPLORATORY LAPAROTOMY LYSIS ADHESIONS;  Surgeon: [name], MD;  Location: [place];   Service: General;  Laterality: N/A;   ORIF TIBIA PLATEAU Left 01/15/2018   Procedure: OPEN REDUCTION INTERNAL FIXATION (ORIF) TIBIAL PLATEAU;  Surgeon: Altamese S[place], MD;  Location: [place];  Service: Orthopedics;  Laterality: Left;   TRANSTHORACIC ECHOCARDIOGRAM  07/2016    EF 60-65%, No RWMA. Mod Concentric LVH - Gr 2 DD. Severe LA dilation. PAP ~35 mmHg (mild Pulm HTN)  --> no changes noted 1 month later   Family History:  Family History  Problem Relation Age of Onset   Heart failure Mother        Died at age 64.   Heart attack Mother 55   Hypertension Mother    Diabetes Mellitus II Mother    Other Father        Unknown   Kidney failure Sister        (Oldest Sister)   Other Other        Multiple siblings have started her heart disease, he is not sure of the details.   CAD Nephew    Family Psychiatric  History:  Social History:  Social History   Substance and Sexual Activity  Alcohol Use No   Alcohol/week: 7.0 standard drinks   Types: 7 Cans of beer per week     Social History   Substance and Sexual Activity  Drug Use Yes   Frequency: 1.0 times per week   Types: Marijuana, Heroin   Comment: snorts heroin, takes pain pills that he buys off the street    Social History   Socioeconomic History   Marital status: Married    Spouse name: Not on file   Number of children: Not on file   Years of education: Not on file   Highest education level: Not on file  Occupational History   Not on file  Tobacco Use   Smoking status: Former    Packs/day: 0.25    Types: Cigarettes    Quit date: 07/06/2017    Years since quitting: 3.9   Smokeless tobacco: Never  Vaping Use   Vaping Use: Never used  Substance and Sexual Activity   Alcohol use: No    Alcohol/week: 7.0 standard drinks    Types: 7 Cans of beer per week   Drug use: Yes    Frequency: 1.0 times per week    Types: Marijuana, Heroin    Comment: snorts heroin, takes pain pills that he buys off the street   Sexual  activity: Not on file    Comment: once a week  Other Topics Concern   Not on file  Social History Narrative   Not on file   Social Determinants of Health   Financial Resource Strain: Not on file  Food Insecurity: Not on file  Transportation Needs: Not on file  Physical Activity: Not on file  Stress: Not on file  Social Connections: Not on file   Additional Social History:    Allergies:   Allergies  Allergen Reactions   No Known Allergies     Labs:  Results for orders placed or performed during the hospital encounter of 06/11/21 (from  the past 48 hour(s))  Hepatitis B surface antibody     Status: None   Collection Time: 06/22/21 10:56 AM  Result Value Ref Range   Hepatitis B-Post 26.9 Immunity>9.9 mIU/mL    Comment: (NOTE)  Status of Immunity                     Anti-HBs Level  ------------------                     -------------- Inconsistent with Immunity                   0.0 - 9.9 Consistent with Immunity                          >9.9 Performed At: [facility] [place], [place] [id number] Rush Farmer MD WU:[id number]   Lactate dehydrogenase (pleural or peritoneal fluid)     Status: Abnormal   Collection Time: 06/22/21 12:36 PM  Result Value Ref Range   LD, Fluid 101 (H) 3 - 23 U/L    Comment: (NOTE) Results should be evaluated in conjunction with serum values    Fluid Type-FLDH PERITONEAL     Comment: Performed at [facility], [place] [address] [place], [place] [address] CORRECTED ON 09/07 AT 1602: PREVIOUSLY REPORTED AS ABDOMEN   Body fluid cell count with differential     Status: Abnormal   Collection Time: 06/22/21 12:36 PM  Result Value Ref Range   Fluid Type-FCT PERITONEAL     Comment: CORRECTED ON 09/07 AT 1602: PREVIOUSLY REPORTED AS ABDOMEN   Color, Fluid STRAW (A) YELLOW   Appearance, Fluid CLOUDY (A) CLEAR   Total Nucleated Cell Count, Fluid 990 0 - 1,000 cu mm    Comment: COUNT MAY BE INACCURATE DUE TO FIBRIN  CLUMPS.   Neutrophil Count, Fluid 87 (H) 0 - 25 %   Lymphs, Fluid 7 %   Monocyte-Macrophage-Serous Fluid 6 (L) 50 - 90 %   Eos, Fluid 0 %   Other Cells, Fluid MESOTHELIAL CELLS %    Comment: Performed at [facility] [address], [place], [place] [address]  Albumin, pleural or peritoneal fluid      Status: None   Collection Time: 06/22/21 12:36 PM  Result Value Ref Range   Albumin, Fluid 1.1 g/dL    Comment: (NOTE) No normal range established for this test Results should be evaluated in conjunction with serum values    Fluid Type-FALB PERITONEAL     Comment: Performed at [facility], [place] [address] [place], [place] [address] CORRECTED ON 09/07 AT 1602: PREVIOUSLY REPORTED AS ABDOMEN   Protein, pleural or peritoneal fluid     Status: None   Collection Time: 06/22/21 12:36 PM  Result Value Ref Range   Total protein, fluid 3.6 g/dL    Comment: (NOTE) No normal range established for this test Results should be evaluated in conjunction with serum values    Fluid Type-FTP PERITONEAL     Comment: Performed at [facility] [address], [place], [place] [address] CORRECTED ON 09/07 AT 1602: PREVIOUSLY REPORTED AS ABDOMEN   Glucose, pleural or peritoneal fluid     Status: None   Collection Time: 06/22/21 12:36 PM  Result Value Ref Range   Glucose, Fluid 84 mg/dL    Comment: (NOTE) No normal range established for this test Results should be evaluated  in conjunction with serum values    Fluid Type-FGLU PERITONEAL     Comment: Performed at [facility], [place] [address], [place], [place] [address] CORRECTED ON 09/07 AT 1602: PREVIOUSLY REPORTED AS ABDOMEN   Body fluid culture w Gram Stain     Status: None (Preliminary result)   Collection Time: 06/22/21 12:36 PM   Specimen: Abdomen; Peritoneal Fluid  Result Value Ref Range   Specimen Description PERITONEAL FLUID    Special Requests ABDOMEN    Gram Stain      MODERATE WBC PRESENT,BOTH PMN AND  MONONUCLEAR NO ORGANISMS SEEN    Culture      NO GROWTH 2 DAYS Performed at [facility], [place] [address], [place], [place] [address]    Report Status PENDING   Creatinine, fluid (pleural, peritoneal, JP Drainage)     Status: None   Collection Time: 06/22/21 12:36 PM  Result Value Ref Range   Creat, Fluid 5.7 mg/dL    Comment: (NOTE) No normal range established for this test Results should be evaluated in conjunction with serum values    Fluid Type-FCRE PERITONEAL     Comment: FLUID Performed at [facility] [address], [place], [place] [address] CORRECTED ON 09/07 AT 1732: PREVIOUSLY REPORTED AS PERITONEAL CAVITY   CBC     Status: Abnormal   Collection Time: 06/23/21  8:00 AM  Result Value Ref Range   WBC 14.8 (H) 4.0 - 10.5 K/uL   RBC 2.77 (L) 4.22 - 5.81 MIL/uL   Hemoglobin 8.0 (L) 13.0 - 17.0 g/dL   HCT 24.4 (L) 39.0 - 52.0 %   MCV 88.1 80.0 - 100.0 fL   MCH 28.9 26.0 - 34.0 pg   MCHC 32.8 30.0 - 36.0 g/dL   RDW 14.6 11.5 - 15.5 %   Platelets 529 (H) 150 - 400 K/uL   nRBC 0.0 0.0 - 0.2 %    Comment: Performed at [facility], [place] [address] [place], [place] [address]    Current Facility-Administered Medications  Medication Dose Route Frequency Provider Last Rate Last Admin   (feeding supplement) PROSource Plus liquid 30 mL  30 mL Oral BID BM Katsadouros, Vasilios, MD   30 mL at 06/15/21 0948   bisacodyl (DULCOLAX) suppository 10 mg  10 mg Rectal Daily Wayland Denis, MD       buPROPion (WELLBUTRIN XL) 24 hr tablet 150 mg  150 mg Oral Daily Akintayo, Mojeed, MD   150 mg at 06/23/21 1438   cefTRIAXone (ROCEPHIN) 2 g in sodium chloride 0.9 % 100 mL IVPB  2 g Intravenous Q24H Wayland Denis, MD 200 mL/hr at 06/23/21 1626 2 g at 06/23/21 1626   Chlorhexidine Gluconate Cloth 2 % PADS 6 each  6 each Topical Q0600 Valentina Gu, NP   6 each at 06/24/21 3335   Darbepoetin Alfa (ARANESP) injection 40 mcg  40 mcg Intravenous Q Thu-HD Ernest Haber,  PA-C   40 mcg at 06/23/21 1400   diltiazem (CARDIZEM CD) 24 hr capsule 360 mg  360 mg Oral Daily Virl Axe, MD   360 mg at 06/23/21 1438   enoxaparin (LOVENOX) injection 30 mg  30 mg Subcutaneous Q24H Katsadouros, Vasilios, MD   30 mg at 06/23/21 1439   feeding supplement (NEPRO CARB STEADY) liquid 237 mL  237 mL Oral TID BM Velna Ochs, MD   237 mL at 06/23/21 2018   ferric citrate (AURYXIA) tablet 420 mg  420 mg  Oral TID WC Ernest Haber, PA-C   420 mg at 06/24/21 0849   hydrALAZINE (APRESOLINE) injection 10 mg  10 mg Intravenous Q4H PRN Katsadouros, Vasilios, MD   10 mg at 06/16/21 2122   hydrALAZINE (APRESOLINE) tablet 25 mg  25 mg Oral TID Rick Duff, MD   25 mg at 06/24/21 0849   HYDROmorphone (DILAUDID) injection 0.5 mg  0.5 mg Intravenous Q3H PRN Rick Duff, MD   0.5 mg at 06/24/21 0849   HYDROmorphone (DILAUDID) tablet 2 mg  2 mg Oral Q4H PRN Rick Duff, MD   2 mg at 06/24/21 0635   lidocaine (LIDODERM) 5 % 1 patch  1 patch Transdermal Q24H Rick Duff, MD   1 patch at 06/24/21 0849   lidocaine (XYLOCAINE) 1 % (with pres) injection    PRN Jacqualine Mau, NP   10 mL at 06/22/21 1235   multivitamin (RENA-VIT) tablet 1 tablet  1 tablet Oral QHS Katsadouros, Vasilios, MD   1 tablet at 06/23/21 2018   ondansetron (ZOFRAN) injection 4 mg  4 mg Intravenous Once Rick Duff, MD       pantoprazole (PROTONIX) EC tablet 20 mg  20 mg Oral Daily Farrel Gordon, DO   20 mg at 06/23/21 1438   polyethylene glycol (MIRALAX / GLYCOLAX) packet 17 g  17 g Oral BID Wayland Denis, MD   17 g at 06/23/21 2017   senna (SENOKOT) tablet 8.6 mg  1 tablet Oral Daily Rick Duff, MD   8.6 mg at 06/23/21 1438    Musculoskeletal: Strength & Muscle Tone: within normal limits Gait & Station: normal Patient leans: N/A            Psychiatric Specialty Exam:  Presentation  General Appearance: Appropriate for Environment; Casual  Eye  Contact:Good  Speech:Clear and Coherent; Normal Rate  Speech Volume:Normal  Handedness:Right   Mood and Affect  Mood:Euthymic  Affect:Appropriate; Congruent   Thought Process  Thought Processes:Coherent; Linear  Descriptions of Associations:Intact  Orientation:Full (Time, Place and Person)  Thought Content:WDL  History of Schizophrenia/Schizoaffective disorder:No data recorded Duration of Psychotic Symptoms:No data recorded Hallucinations:Hallucinations: None Ideas of Reference:None  Suicidal Thoughts:Suicidal Thoughts: No Homicidal Thoughts:Homicidal Thoughts: No  Sensorium  Memory:Immediate Good; Recent Good; Remote Good  Judgment:Good  Insight:Good   Executive Functions  Concentration:Fair  Attention Span:Fair  [place]   Psychomotor Activity  Psychomotor Activity: Psychomotor Activity: Normal  Assets  Assets:Financial Resources/Insurance; Armed forces logistics/support/administrative officer; Leisure Time; Physical Health; Resilience; Social Support; Talents/Skills   Sleep  Sleep: Sleep: Fair  Physical Exam: Physical Exam Vitals and nursing note reviewed.  Constitutional:      Appearance: Normal appearance. He is obese.  Cardiovascular:     Rate and Rhythm: Normal rate.  Neurological:     Mental Status: He is alert.  Psychiatric:        Attention and Perception: Attention and perception normal.        Mood and Affect: Mood and affect normal.        Speech: Speech normal.        Behavior: Behavior normal.        Thought Content: Thought content normal.        Cognition and Memory: Cognition and memory normal.        Judgment: Judgment normal.   Review of Systems  Psychiatric/Behavioral:  Negative for depression and suicidal ideas. The patient is not nervous/anxious.   All other systems reviewed and are negative. Blood pressure Marland Kitchen)  167/90, pulse 92, temperature 99.6 F (37.6 C), temperature source Oral, resp. rate 18,  height 6\' 1"  (1.854 m), weight 69.2 kg, SpO2 97 %. Body mass index is 20.13 kg/m.  Treatment Plan Summary: Plan Psych cleared. May follow up with integrative therapist at [facility].  -Continue Wellbutrin 150 mg p.o. daily   Disposition: No evidence of imminent risk to self or others at present.   Patient does not meet criteria for psychiatric inpatient admission. Supportive therapy provided about ongoing stressors. Discussed crisis plan, support from social network, calling 911, coming to the Emergency Department, and calling Suicide Hotline.  Suella Broad, FNP 06/24/2021 10:53 AM

## 2021-06-24 NOTE — Progress Notes (Signed)
Subjective: No acute events or concerns overnight. Patient reports overall improvement in abd pain and distension, reports fluid accumulation lessening. Still tolerating PO intake. Passing gas and having small regular BMs. Patient wants to leave AMA for second opinion at [facility]. He denies SI today given improvement in pain and distension.    Objective:  Vital signs in last 24 hours: Vitals:   06/23/21 1316 06/23/21 1403 06/23/21 2026 06/24/21 0407  BP: (!) 157/87 (!) 165/95 (!) 163/74 (!) 167/90  Pulse: 83 91 93 92  Resp: 18 19 18 18   Temp:  98.2 F (36.8 C) 98.2 F (36.8 C) 99.6 F (37.6 C)  TempSrc:  Oral Oral Oral  SpO2:  95% 97% 97%  Weight:      Height:       Physical Exam General: alert, appears stated age, in no acute distress Abdomen: Soft, distended, bowel sounds present, epigastric tenderness, +shifting dullness, pitting edema bilateral flanks  CV: regular rate and rhythm, likely bruit from AV fistula heard, nor rubs no gallops, trace LLE Pulm: Non labored breathing on room air, no wheezing or crackles.  Skin: Warm and dry Psych: less tearful affect  Assessment/Plan:  Principal Problem:   Major depressive disorder, single episode, severe ([facility]) Active Problems:   ESRD on hemodialysis ([place])   Ileus ([place])   Protein-calorie malnutrition, severe   Bacterial peritonitis ([place])   Other ascites   Renal mass   Abdominal pain  Timothy [name] is a 60 yo M with PMHx of ESRD on HD TTS, colon cancer s/p resection 2014, HFpEF, HTN, and history of polysubstance use who was admitted for ileus and found to have mod volume ascites of unclear etiology, bacterial peritonitis, with incidental finding of benign renal mass, and development of SI during admission. On hospital day 12.   Bacterial peritonitis  Abdominal ascites  Patient noted to have mod volume ascites on imaging. Concern for SBP. He was started on IV antibiotics for possible secondary bacterial peritonitis. Now s/p  paracentesis x3. Peritoneal fluid culture grew E Coli. Repeat fluid analysis 9/2 showed no organisms on gram stain and cultures with NGTD. Neutrophil count decreased from prior.  Normal total protein, albumin and glucose, with increased LDH. Cytology again negative for malignant cells. SAAG 0.3. Repeat CT abdomen/pelvis 9/2 showed moderate volume ascites and thick walled gallbladder (likely 2/2 infection/ascites).   Patient likely has secondary bacterial peritonitis though the source of his infection remains unclear. HIDA scan without evidence of cholecystitis as secondary cause of bacterial peritonitis. Infection could be secondary to post op complication vs complication of ileus. On IV antibiotics, patient's ascitic fluid WBC count improving.   Patient has moderate volume ascites of unclear etiology. Unlikely that heart failure is contributing to fluid accumulation given ECHO on 9/5. We have also considered malignant ascites though less likely since he had no evidence metastatic disease on extensive imaging. Ascites fluid Cr 5.7, urinary ascites unlikely. Etiology possibly secondary to post op complication vs secondary to bacterial peritonitis. Regardless of etiology of ascites, patient would be managed with serial drainage of ascites fluid outpatient. It is reassuring that only 1L fluid was obtained with most recent paracentesis, which may indicate fluid accumulation is slowing. May be delayed resolution of ascites 2/2 to bacterial peritonitis.  -General surgery signed off, and no acute surgical intervention warranted at this time.  - GI signed off - Finish ceftriaxone day 10/10 - Pain control with oral dilaudid 2mg  q4hr and IV dilaudid 0.5 mg q3h prn for severe  breakthrough pain -Bowel regimen: senna, dulcolax, miralax - consider palliative care consult - patient would like 2nd opinion with [facility], may leave AMA  Prandial Abdominal Pain Patient having abdominal pain when he eats meals. Pain improves  post prandially. Would like to evaluate for mesenteric ischemia as component of abdominal pain initially thought to be due just to distension from ascites and resolved ileus.  -CTA ABD PEL on Saturday 9/10 before HD, ok'd with nephrology NP  Ileus, resolved S/p a recent exploratory laparotomy with lysis of adhesions on 05/23/2021. Adhesions 2/2 bowel resection for colon cancer in 2014. He returned to [facility] on 8/27 for ongoing abdominal pain and ileus. His Ileus has since resolved and he is tolerating PO intake, having BMs.  Hemosiderosis Incidental Imaging Finding Renal MRI on 9/6 showed evidence of iron deposition in the liver and spleen. Patient receives IV iron during HD as part of treatment for anemia as well as Aranesp shot. Prior iron studies with Iron 77, no ferritin obtained at that time. Hgb down trending from 12.1 to 8.8 over last few days, now stable at ~8. Iron studies 9/6 showed iron 24, % sat 19, ferritin 1741. -Hematology recommends outpatient follow up, etiology likely 2/2 to IV iron given during HD sessions. -Hold off on IV iron with HD  Suicidal ideations  Patient reported suicidal ideations and plan 9/2. He continues to note feeling depressed due to his persistent abdominal pain and financial issues regarding his social security. He denies SI. - Psychiatry consulted, appreciate recs  - Wellbutrin 150mg  daily, will continue at discharge - Psychiatry cleared patient to discharge to home, no longer endorsing SI.   - Patient can follow up with integrative therapist at [facility].  Normocytic Anemia Hx Anemia of CKD Patient's Hgb has downtrended from 12 to 8.8 over last 5 days. Now stable ~8. Patient receives darbepoetin shot with dialysis. Last iron profile 9/6  iron 24, % sat 19, ferritin 1741. Hx colon cancer, possible fecal occult blood loss. - FOBT pending  Hypoalbuminemia Patient's most recent albumin 1.6 in the setting of poor PO intake and ESRD. Gave albumin with  9/7 paracentesis. 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 Previously described splenic hypodensities no longer appreciated on repeat imaging, thus less likely malignant, MRI favoring benign lymphangioma or cyst. CT chest negative for any metastatic lesions. Renal MRI showed right renal lesion most consistent with hemorrhagic/proteinaceous cyst with hemosiderosis. - Urology signed off  ESRD on HD  HD 9/3 however patient felt HD was contributing to his abdominal pain and requested it be stopped. Only ~100cc of UF was removed.  Labs obtained at that time with no significant electrolyte abnormality except for patient's persistently elevated BUN. On his T/Th/Sat schedule.  - Nephrology on board, appreciate recommendations: next HD 9/10 - more aggressive volume removal in HD given elevated Bps and trace LLE   HTN Blood pressures have been persistently elevated. 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  HCV PCR non reactive. No active infection, reflective of prior infection. No further medical management at this time.  Diet: Renal / Carb modified VTE: lovenox IVF: none Code: Full  Prior to Admission Living Arrangement: Home Anticipated Discharge Location: Home Barriers to Discharge: Clinical  improvement Dispo: Anticipated discharge pending clinical improvement.   Wayland Denis, MD 06/24/2021, 12:35 PM Pager: [phone] After 5pm on weekdays and 1pm on weekends: On Call pager [phone]

## 2021-06-25 ENCOUNTER — Inpatient Hospital Stay (HOSPITAL_COMMUNITY): Payer: Medicare HMO

## 2021-06-25 DIAGNOSIS — F322 Major depressive disorder, single episode, severe without psychotic features: Secondary | ICD-10-CM | POA: Diagnosis not present

## 2021-06-25 LAB — CBC
HCT: 22.1 % — ABNORMAL LOW (ref 39.0–52.0)
Hemoglobin: 7.3 g/dL — ABNORMAL LOW (ref 13.0–17.0)
MCH: 29.3 pg (ref 26.0–34.0)
MCHC: 33 g/dL (ref 30.0–36.0)
MCV: 88.8 fL (ref 80.0–100.0)
Platelets: 533 10*3/uL — ABNORMAL HIGH (ref 150–400)
RBC: 2.49 MIL/uL — ABNORMAL LOW (ref 4.22–5.81)
RDW: 14.7 % (ref 11.5–15.5)
WBC: 14.6 10*3/uL — ABNORMAL HIGH (ref 4.0–10.5)
nRBC: 0 % (ref 0.0–0.2)

## 2021-06-25 LAB — RENAL FUNCTION PANEL
Albumin: 1.6 g/dL — ABNORMAL LOW (ref 3.5–5.0)
Anion gap: 12 (ref 5–15)
BUN: 46 mg/dL — ABNORMAL HIGH (ref 6–20)
CO2: 26 mmol/L (ref 22–32)
Calcium: 7.8 mg/dL — ABNORMAL LOW (ref 8.9–10.3)
Chloride: 94 mmol/L — ABNORMAL LOW (ref 98–111)
Creatinine, Ser: 6.41 mg/dL — ABNORMAL HIGH (ref 0.61–1.24)
GFR, Estimated: 9 mL/min — ABNORMAL LOW (ref >60)
Glucose, Bld: 119 mg/dL — ABNORMAL HIGH (ref 70–99)
Phosphorus: 2.3 mg/dL — ABNORMAL LOW (ref 2.5–4.6)
Potassium: 4.5 mmol/L (ref 3.5–5.1)
Sodium: 132 mmol/L — ABNORMAL LOW (ref 135–145)

## 2021-06-25 MED ORDER — IOHEXOL 350 MG/ML SOLN
75.0000 mL | Freq: Once | INTRAVENOUS | Status: AC | PRN
  Administered 2021-06-25: 75 mL via INTRAVENOUS

## 2021-06-25 MED ORDER — BUPROPION HCL ER (XL) 150 MG PO TB24: 150.0000 mg | ORAL_TABLET | Freq: Every day | ORAL | 0 refills | Status: DC

## 2021-06-25 MED ORDER — HYDROMORPHONE HCL 2 MG PO TABS: 2.0000 mg | ORAL_TABLET | Freq: Four times a day (QID) | ORAL | 0 refills | Status: AC | PRN

## 2021-06-25 MED ORDER — POLYETHYLENE GLYCOL 3350 17 G PO PACK: 17.0000 g | PACK | Freq: Every day | ORAL | 0 refills | Status: DC | PRN

## 2021-06-25 MED ORDER — CHLORHEXIDINE GLUCONATE CLOTH 2 % EX PADS
6.0000 | MEDICATED_PAD | Freq: Every day | CUTANEOUS | Status: DC
  Administered 2021-06-25: 6 via TOPICAL

## 2021-06-25 MED ORDER — SENNA 8.6 MG PO TABS: 1.0000 | ORAL_TABLET | Freq: Every day | ORAL | 0 refills | Status: DC | PRN

## 2021-06-25 MED ORDER — HYDRALAZINE HCL 25 MG PO TABS: 25.0000 mg | ORAL_TABLET | Freq: Three times a day (TID) | ORAL | 0 refills | Status: DC

## 2021-06-25 MED ORDER — CIPROFLOXACIN HCL 500 MG PO TABS: 500.0000 mg | ORAL_TABLET | Freq: Every day | ORAL | 0 refills | Status: AC

## 2021-06-25 NOTE — Plan of Care (Signed)
  Problem: Nutrition: Goal: Adequate nutrition will be maintained Outcome: Progressing   Problem: Pain Managment: Goal: General experience of comfort will improve Outcome: Progressing   Problem: Skin Integrity: Goal: Risk for impaired skin integrity will decrease Outcome: Progressing   

## 2021-06-25 NOTE — Progress Notes (Signed)
Pt discharged home after dialysis. 4 liters removed during HD

## 2021-06-25 NOTE — Discharge Instructions (Addendum)
You were hospitalized for ileus (no bowel movement), and bacterial peritonitis (abdominal fluid space infection) with ascites (abdominal fluid accumulation). Thank you for allowing [place] to be part of your care.   Please follow up with your primary care doctor within one week.  Please note these changes made to your medications:   Please START taking:  Ciprofloxacin 500mg  one tablet daily for 5 days. (Antibiotic) *very important* Dilaudid (pain med as needed) Wellbutrin (antidepressant) Hydralazine 25mg  three times daily (blood pressure medication) Senna and Miralax (stool softeners)

## 2021-06-25 NOTE — Discharge Summary (Signed)
Name: Timothy [name] MRN: [id number] DOB: 02-Jan-1961 60 y.o. PCP: Timothy Mccreedy, MD  Date of Admission: 06/11/2021  2:06 PM Date of Discharge: 06/25/21 Attending Physician: Timothy Ochs, MD  Discharge Diagnosis: 1. Ileus 2.Bacterial peritonitis  3. Ascities 4. Major depressive disorder 5. 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 Start taking on: June 26, 2021   ciprofloxacin 500 MG tablet Commonly known as: Cipro Take 1 tablet (500 mg total) by mouth daily for 5 days.   diltiazem 360 MG 24 hr capsule Commonly known as: CARDIZEM CD Take 360 mg by mouth daily.   hydrALAZINE 25 MG tablet Commonly known as: APRESOLINE Take 1 tablet (25 mg total) by mouth 3 (three) times daily.   HYDROmorphone 2 MG tablet Commonly known as: DILAUDID Take 1 tablet (2 mg total) by mouth every 6 (six) hours as needed for up to 5 days for severe pain or moderate pain.   lidocaine-prilocaine cream Commonly known as: EMLA Apply 1 application topically See admin instructions. Tuesday,thursday,Saturday prior to dialysis   polyethylene glycol 17 g packet Commonly known as: MIRALAX / GLYCOLAX Take 17 g by mouth daily as needed.   senna 8.6 MG Tabs tablet Commonly known as: SENOKOT Take 1 tablet (8.6 mg total) by mouth daily as needed for mild constipation.   sevelamer carbonate 800 MG tablet Commonly known as: RENVELA Take 2 tablets by mouth with breakfast, with lunch, and with evening meal.               Durable Medical Equipment  (From admission, onward)           Start     Ordered   06/14/21 1520  For home use only DME Walker rolling  Once       Question Answer Comment  Walker: With [place]   Patient needs a walker to treat with the following condition Weak      06/14/21 1520            Disposition and follow-up:   Timothy [name] was discharged from [facility] in Stable condition.  At the hospital follow up visit please address:  Ileus, Bacterial peritonitis, Ascites:  Ascites is likely secondary to bacterial peritonitis. Patient wishing to leave hospital after 10d course of ceftriazone. Was given additional 5d course of Cipro given persistent elevation of PMNs above 250 though decreasing while on antibiotic therapy. Cipro 500mg  once daily for 5 days, dosed for patient on HD to end 9/15. Anticipate fluid accumulation will continue to slow with treatment of infection though if continues to re-accumulate will need to do repeat paracentesis and send for cell count, culture etc. Close follow up with PCP and f/u with GI.  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  2.  Labs / imaging needed at time of follow-up: renal function panel, cbc  3.  Pending labs/ test needing follow-up: none  Follow-up Appointments:  Follow-up Information     Timothy Mons, MD. Schedule an appointment as soon as possible for a visit  today.   Specialty: Urology Why: Call my office to schedule an appointment for outpatient follow-up of right kidney mass Contact information: [address] [place] [address] [phone]         Timothy Distance, MD. Schedule an appointment as soon as possible for a visit.   Specialty: Gastroenterology Why: follow up gastrointestinal and liver issues (hepatitis C) Contact information: [address] [place] [address] [phone]         Timothy Mccreedy, MD. Schedule an appointment as soon as possible for a visit in 1 week(s).   Specialty: Internal  Medicine Contact information: [place] [address] [phone]                 Hospital Course by problem list: Timothy [name] is a 60 yo M with PMHx of ESRD on HD TTS, colon cancer s/p resection 2014, HFpEF, HTN, and history of polysubstance use who was admitted for ileus and found to have mod volume ascites of unclear etiology, bacterial peritonitis, with incidental finding of benign renal mass, and development of SI during admission.  Bacterial peritonitis  Abdominal ascites  Patient noted to have mod volume ascites on imaging. With severe abdominal pain concerning for SBP vs secondary bacterial peritonitis. He was started on IV antibiotics. Now s/p paracentesis x3. Peritoneal fluid culture grew E Coli. Repeat fluid analysis 9/2 cultures with NGTD. Neutrophil count decreased from prior. Cytology negative for malignant cells x2. Repeat CT abdomen/pelvis 9/2 showed moderate volume ascites and thick walled gallbladder (likely 2/2 infection/ascites).    Patient likely had SBP, no clear source of  infection to suggest diagnosis of secondary bacterial peritonitis: HIDA scan without evidence of cholecystitis as secondary cause of bacterial peritonitis, CT abd pel negative for acute source of infection. On IV antibiotics, patient's ascitic fluid WBC count improved. Patient completed 10d course of IV ceftriaxone 2g. CTA abd pel incidentally found loculation and enhancement/thickening of the peritoneal lining though patient improving clinically with less fluid accumulation and less pain. Preferred treatment would have been to continue on IV abx until repeat paracentesis fluid shows PMNs <250 then d/c abx. However patient has been threatening to leave last few days. He refused to complete HD today 9/10. He is politely requesting earlier discharge. We will offer discharge today and start 5d course of PO ciprofloxacin 500mg  once daily, dosed for patient on HD to attempt to best treat  SBP with loculations on imaging today 9/10.   Patient has moderate volume ascites of unclear etiology. No evidence of liver disease or liver cirrhosis. Unlikely that heart failure is contributing to fluid accumulation per ECHO results on 9/5. We have also considered malignant ascites though less likely since he had no evidence of primary cancer or metastatic disease on several imaging modalities of abdomen/chest. Ascites fluid Cr 5.7, urinary ascites unlikely. Etiology likely secondary to bacterial peritonitis given improvement of symptoms and slowing of ascites fluid collection. Regardless of etiology of ascites, patient would be managed with serial drainage of ascites fluid outpatient. It is reassuring that only 1L fluid was obtained with most recent paracentesis and that his abdominal pain is improving.   Patient will need close follow up with PCP for ascites/bacterial peritonitis. Patient will also need to follow up with GI. He may need additional paracentesis done if fluid re-accumulates, will want to obtain repeat cell counts to ensure PMNs still decreasing. Will discharge him on 2mg  PO dilaudid as needed q6hr for 5 days.  Ileus, resolved S/p a recent exploratory laparotomy with lysis of adhesions on 05/23/2021. Adhesions 2/2 bowel resection for colon cancer in 2014. He returned to [facility] on 8/27 for ongoing abdominal pain and ileus. His Ileus has since resolved and he is tolerating PO intake, having BMs.  Prandial Abdominal Pain Patient having abdominal pain when he eats meals. Pain improves post prandially. Would like to evaluate for mesenteric ischemia as component of abdominal pain initially thought to be due just to distension from ascites and resolved ileus. CTA ABD PEL on Saturday 9/10 showed no evidence of significant stenosis or occlusion to explain prandial abdominal pain. He did have persistent diffuse mild submucosal edema and dilation throughout the jejunum in the left hemiabdomen which we  think may be attributable to recent ileus or edema from ascites. No further management at this time given patient's GI symptoms improved and he is eating and having BMs. CTA also showed multifocal patchy foci of ground-glass attenuation airspace opacity in the visualized portion of the upper lungs bilaterally though patient without respiratory symptoms at this time sating well on RA. No further medical management at this time. Patient requesting to leave hospital early at this time. Could work up abdominal pain further outpatient.   Hemosiderosis Incidental Imaging Finding Renal MRI on 9/6 showed evidence of iron deposition in the liver and spleen. Patient receives IV iron during HD as part of treatment for anemia as well as Aranesp shot. Prior iron studies with Iron 77, no ferritin obtained at that time. Hgb down trending from 12.1 to 8.8 over last few days, now stable at ~8. Iron studies 9/6 showed iron 24, % sat 19, ferritin 1741. Hematology called and they said we could consider outpatient follow up though etiology likely 2/2 to IV iron given during HD sessions. Hold off on IV iron with HD.  Suicidal ideations  Patient reported suicidal ideations and plan 9/2. He noted feeling depressed due to his persistent abdominal pain and financial issues regarding his social security. He denies SI on 9/10 given improvement in symptoms and outlook on future. Psychiatry signed off.  Patient was started on Wellbutrin 150mg  daily, will continue at discharge. Patient can follow up with integrative therapist at [facility].  Normocytic Anemia Hx Anemia of CKD Patient's Hgb has downtrended from 12 to 8.8 over last few days. Now stable ~8. Patient receives darbepoetin shot with dialysis. Last iron profile 9/6  iron 24, % sat 19, ferritin 1741. Hx colon cancer, possible fecal occult blood loss. We wanted to get FOBT though sample was never obtained. Patient requesting to leave hospital early at this time. Will want  to follow up CBC at follow up.   Hypoalbuminemia Patient's most recent albumin 1.6 in the setting of poor PO intake and ESRD. Gave albumin with 9/7 paracentesis. Nutrition was consulted and recommendations were followed while inpatient.   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 Previously described splenic hypodensities no longer appreciated on repeat imaging, thus less likely malignant, MRI favoring benign lymphangioma or cyst. CT chest negative for any metastatic lesions. Renal MRI showed right renal lesion most consistent with hemorrhagic/proteinaceous cyst with hemosiderosis. F/u with urology for renal cyst found incidentally on imaging.   ESRD on HD  On his T/Th/Sat schedule. Nephrology consulted for inpatient HD. Last HD inpatient on 9/10 on day of discharge. This most recent session, plan was for net UF 4.5L and was able to get off  4L with lower dry weight goal set for future HD. Patient tolerated session well but was anxious to leave hospital and refused to complete full course of HD.   HTN Blood pressures have been persistently elevated. Continued on home diltiazem 360 mg daily as well oral hydralazine 25 mg TID which he will discharge on. Increased UF at HD to help with Bps and patient was tolerating well. Patient requesting to leave hospital early at this time.   Prior Hep C Infection (previously treated) Tested HCV reactive on 9/7.  HCV PCR non reactive. No active infection, reflective of prior infection. No further medical management at this time.  Subjective on day of discharge: Patient seen on rounds in HD unit. He reports improvement of pain, feeling well today. States his fluid has not re-accumulated. Is requesting to leave today. Requests scripts for stool softener and pain medication to carry him over until close follow up with PCP.      We discussed CTA findings and concern  for signs of persistent infection. We discussed chance of return of infection on IV abx vs 5d course of PO abx and that IV abx is preferred treatment option. Patient voiced understanding and would like to discharge today on 5d course of PO abx given improvement in symptoms and anxious to leave hospital.   Discharge Exam:   BP (!) 183/92 (BP Location: Right Leg)   Pulse 92   Temp 98.1 F (36.7 C) (Oral)   Resp 18   Ht 6\' 1"  (1.854 m)   Wt 69 kg   SpO2 100%   BMI 20.07 kg/m  Physical Exam General: alert, appears stated age, in no acute distress Abdomen: Soft, distended, bowel sounds present, epigastric tenderness, +shifting dullness, pitting edema bilateral flanks  CV: regular rate and rhythm, likely bruit from AV fistula heard, nor rubs no gallops, trace LLE Pulm: Non labored breathing on room air, no wheezing or crackles.  Skin: Warm and dry Psych: less tearful affect  Pertinent Labs, Studies, and Procedures:  CBC Latest Ref Rng & Units 06/25/2021 06/23/2021 06/21/2021  WBC 4.0 - 10.5 K/uL 14.6(H) 14.8(H) 16.1(H)  Hemoglobin 13.0 - 17.0 g/dL 7.3(L) 8.0(L) 8.5(L)  Hematocrit 39.0 - 52.0 % 22.1(L) 24.4(L) 24.8(L)  Platelets 150 - 400 K/uL 533(H) 529(H) 529(H)   BMP Latest Ref Rng & Units 06/25/2021 06/21/2021 06/18/2021  Glucose 70 - 99 mg/dL 119(H) 127(H) 118(H)  BUN 6 - 20 mg/dL 46(H) 78(H) 63(H)  Creatinine 0.61 - 1.24 mg/dL 6.41(H) 8.36(H) 6.12(H)  Sodium 135 - 145 mmol/L 132(L) 127(L) 131(L)  Potassium 3.5 - 5.1 mmol/L 4.5 4.2 3.8  Chloride 98 - 111 mmol/L 94(L) 92(L) 94(L)  CO2 22 - 32 mmol/L 26 23 26   Calcium 8.9 - 10.3 mg/dL 7.8(L) 7.7(L) 8.0(L)   Body fluid cell count with differential [[id number]] (Abnormal) Collected: 06/22/21 1236  Specimen: Peritoneal Fluid from Abdomen Updated: 06/22/21 1805   Fluid Type-FCT PERITONEAL   Color, Fluid STRAW Abnormal    Appearance, Fluid CLOUDY Abnormal    Total Nucleated Cell Count, Fluid 990 cu mm    Neutrophil Count, Fluid 87 High  %     Lymphs, Fluid 7 %    Monocyte-Macrophage-Serous Fluid 6 Low  %    Eos, Fluid 0 %    Other Cells, Fluid MESOTHELIAL CELLS %    Lactate dehydrogenase (pleural or peritoneal fluid) [[id number]] (Abnormal) Collected: 06/22/21 1236  Specimen: Peritoneal Fluid from Abdomen Updated: 06/22/21 1711   LD, Fluid 101 High  U/L  Fluid Type-FLDH PERITONEAL  Albumin, pleural or peritoneal fluid  [[id number]] Collected: 06/22/21 1236  Specimen: Peritoneal Fluid from Abdomen Updated: 06/22/21 1711   Albumin, Fluid 1.1 g/dL    Fluid Type-FALB PERITONEAL  Protein, pleural or peritoneal fluid [[id number]] Collected: 06/22/21 1236  Specimen: Peritoneal Fluid from Abdomen Updated: 06/22/21 1711   Total protein, fluid 3.6 g/dL    Fluid Type-FTP PERITONEAL  Glucose, pleural or peritoneal fluid [[id number]] Collected: 06/22/21 1236  Specimen: Peritoneal Fluid from Abdomen Updated: 06/22/21 1711   Glucose, Fluid 84 mg/dL    Fluid Type-FGLU PERITONEAL   Creatinine, fluid (pleural, peritoneal, JP Drainage) [[id number]] Collected: 06/22/21 1236  Specimen: Peritoneal Fluid from Peritoneal Cavity Updated: 06/22/21 1815   Creat, Fluid 5.7 mg/dL    Fluid Type-FCRE PERITONEAL   Body fluid culture w Gram Stain [[id number]] Collected: 06/22/21 1236  Specimen: Peritoneal Fluid from Abdomen Updated: 06/25/21 0744   Specimen Description PERITONEAL FLUID   Special Requests ABDOMEN   Gram Stain --   MODERATE WBC PRESENT,BOTH PMN AND MONONUCLEAR  NO ORGANISMS SEEN    Culture --   NO GROWTH 3 DAYS    HCV RNA quant rflx ultra or genotyp [[id number]] Collected: 06/22/21 1039  Specimen: Blood Updated: 06/23/21 1636   HCV RNA Qnt(log copy/mL) UNABLE TO CALCULATE log10 IU/mL    HepC Qn HCV Not Detected IU/mL    Test Information Comment   Hcv Genotype RTNI   CT ABDOMEN PELVIS W CONTRAST  Result Date: 06/11/2021 CLINICAL DATA:  Evaluate for small bowel obstruction. No bowel movement or voiding for 3 days. EXAM: CT ABDOMEN  AND PELVIS WITH CONTRAST TECHNIQUE: Multidetector CT imaging of the abdomen and pelvis was performed using the standard protocol following bolus administration of intravenous contrast. CONTRAST:  81mL OMNIPAQUE IOHEXOL 350 MG/ML SOLN COMPARISON:  CT abdomen dated 05/20/2021. FINDINGS: Lower chest: Dense bibasilar consolidations, increased compared to the previous exam. Hepatobiliary: No focal liver abnormality is seen. Probable gallstone within the nondistended gallbladder. Gallbladder walls cannot be evaluated due to surrounding ascites. Mild intrahepatic bile duct prominence. Pancreas: Pancreatic duct is again dilated, measuring up to 8 mm diameter. No pancreatic mass is identified. Spleen: Hypodense lesion within the upper spleen, measuring 1.9 cm, as previously described and again favored to represent a cyst. Additional subtle hypodense foci are now seen along the inferior margin of the spleen, not convincingly seen on previous exams and therefore suspicious for splenic infarct or neoplastic lesions, possibly edema related to the surrounding ascites. Adrenals/Urinary Tract: Adrenal glands are unremarkable. Innumerable hypodense masses within the bilateral atrophic kidneys, majority favored to represent cysts. Again noted is the dominant hypodense mass within the RIGHT kidney, measuring 3.4 cm, suggesting neoplastic mass such as renal cell carcinoma. No hydronephrosis. Bladder is not seen, presumably obscured by surrounding ascites. Stomach/Bowel: No dilated large or small bowel loops. Single patulous loop of large bowel within the upper abdomen at the site of a previous surgical bowel anastomosis, but no proximal or distal bowel dilatation to suggest mechanical obstruction. Moderate amount of gas and stool within the colon. Fluid and associated air-fluid levels are seen within the nondistended small bowel, suggesting ileus. Vascular/Lymphatic: Aortic atherosclerosis. No acute-appearing vascular abnormality. No  enlarged lymph nodes are seen, although characterization is limited by the large volume ascites. Reproductive: Prostate is unremarkable. Other: Large volume ascites throughout the abdomen and pelvis. No evidence of free intraperitoneal air. Musculoskeletal: No acute or suspicious osseous abnormality. IMPRESSION: 1. Large volume ascites throughout the abdomen and pelvis. This has significantly  increased compared to the CT abdomen of 05/20/2021. 2. Status post RIGHT hemicolectomy. Single patulous loop of large bowel within the upper abdomen at the site of the associated surgical bowel anastomosis, but no proximal or distal bowel dilatation to suggest mechanical obstruction. 3. Fluid and associated air-fluid levels within the nondistended small bowel, suggesting ileus. 4. Dominant hypodense mass within the RIGHT kidney measures 3.4 cm, highly suspicious for neoplastic mass such as renal cell carcinoma. 5. Numerous additional cysts within the bilateral atrophic kidneys. 6. New subtle hypodense foci along the inferior margin of the spleen, suspicious for splenic infarcts or neoplastic/metastatic lesions, less likely parenchymal edema related to the surrounding ascites. Stable presumed cyst within the upper spleen. 7. Dense bibasilar consolidations, increased compared to the previous exam. These could represent pneumonia or aspiration. 8. Cholelithiasis. 9. Pancreatic duct dilatation, stable compared to CT abdomen of 05/20/2021. This is of uncertain etiology. Aortic Atherosclerosis (ICD10-I70.0). Electronically Signed   By: Franki Cabot M.D.   On: 06/11/2021 17:41    NUCLEAR MEDICINE HEPATOBILIARY IMAGING WITH GALLBLADDER EF  Patient refused further imaging after 36 minutes of sequential scanning as such this is an incomplete study and evaluation for gallbladder motility/ejection fraction could not be obtained. FINDINGS: Prompt uptake and biliary excretion of activity by the liver is seen. Gallbladder activity is  visualized, consistent with patency of cystic duct. Biliary activity passes into small bowel, consistent with patent common bile duct.  Patient refused to continue study after 36 minutes of sequential imaging, as such gallbladder motility could not be evaluated. IMPRESSION: 1.  Patent cystic and common bile ducts.  2. No evaluation of ejection fraction given patient's refusal to continue study. Electronically Signed   By: Dahlia Bailiff M.D.   On: 06/22/2021 15:53   MRI ABDOMEN WITHOUT AND WITH CONTRAST IMPRESSION: 1. Moderate to markedly motion degraded exam. 2. The right renal lesion is most consistent with a hemorrhagic/proteinaceous cyst. 3. Hemosiderosis. 4. Ascites and left pleural effusion, suggesting fluid overload. 5. Cholelithiasis 6.  Aortic Atherosclerosis (ICD10-I70.0). 7. Mildly distended large and small bowel, favoring adynamic ileus. Suboptimally evaluated. Electronically Signed   By: Abigail Miyamoto M.D.   On: 06/21/2021 12:04  CT CHEST, ABDOMEN, AND PELVIS WITH CONTRAST 9/2 IMPRESSION: 1. Bandlike opacities throughout the lower lobes, slightly more confluent on the right where there are central air bronchograms. Findings may represent atelectasis or infection. There is a small left pleural effusion. 2. Occasional areas of perifissural and subpleural ground-glass opacity in the upper lobes, may be infectious or inflammatory. 3. No pulmonary nodule, mass, or evidence of intrathoracic malignancy. 4. Indeterminate solid-appearing lesion in the right mid kidney measuring approximately 3.1 cm, not significantly changed from recent exam, remaining suspicious for neoplasm. No adenopathy. 5. 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 Subtle subcentimeter low-density in the inferior spleen, nonspecific. Additional areas of decreased density on prior exam are no longer seen. This may represent splenic infarcts sequela, less likely metastatic disease given findings have improved from  recent prior exam. 6. Moderate volume abdominopelvic ascites. Generalized edema of the subcutaneous tissues. 7. Thick-walled gallbladder is nonspecific in the setting of ascites. Punctate gallstone, assessed with recent ultrasound. 8. Stable pancreatic ductal dilatation. 9. The lower most field-of-view there is air adjacent to the perineal soft tissues, it is unclear if this represents soft tissue air or simply air within adjacent soft tissue structures. Recommend correlation with physical exam for symptoms of crepitus or soft tissue infection. Air coursing adjacent to soft tissue structures is favored on reformats.  Electronically Signed   By: Keith Rake M.D.   On: 06/17/2021 22:24  ABDOMEN - 1 VIEW FINDINGS: Several normal caliber air-filled loops of small bowel in the right hemiabdomen. No bowel dilatation or evidence of obstruction. Moderate stool throughout the colon. No free air. Multiple surgical clips. Atherosclerotic calcification of the aorta. No acute osseous pathology. IMPRESSION: Nonobstructive bowel gas pattern.  Electronically Signed   By: Anner Crete M.D.   On: 06/16/2021 21:22  ABDOMEN ULTRASOUND COMPLETE IMPRESSION: 1. Multiple gallstones and gallbladder sludge. Gallbladder wall thickening 6 mm. Negative sonographic Murphy sign. Possible acute or chronic cholecystitis. 2. Large amount of ascites 3. Small kidneys with multiple cysts. No obstruction. 2.6 cm solid mass right upper pole, possible neoplasm. 4. Pancreatic duct dilatation. Electronically Signed   By: Franchot Gallo M.D.   On: 06/15/2021 19:19  Discharge Instructions: Discharge Instructions     Call MD for:  persistant nausea and vomiting   Complete by: As directed    Call MD for:  severe uncontrolled pain   Complete by: As directed    Call MD for:  temperature >100.4   Complete by: As directed    No wound care   Complete by: As directed        Signed: Wayland Denis,  MD 06/25/2021, 3:58 PM   Pager: [phone]

## 2021-06-25 NOTE — Progress Notes (Addendum)
[name] Progress Note   Subjective:   Patient seen and examined at bedside.  Reports feeling better.  Wants to go home. Admits to mild abdominal pain after eating but states he quickly resolves.  Denies CP, SOB, n/v/d and current abdominal pain. Plan for UF with dialysis today 4.5L as tolerated.    Objective Vitals:   06/25/21 1130 06/25/21 1200 06/25/21 1230 06/25/21 1300  BP: (!) 208/91 (!) 206/101 (!) 200/97 (!) 198/95  Pulse: 83 78 82 83  Resp:      Temp:      TempSrc:      SpO2:      Weight:      Height:       Physical Exam General:chronically ill appearing male in NAD Heart:RRR, no mrg Lungs:+scattered rhonchi Abdomen:+ascites, NT, +BS Extremities:+pedal edema Dialysis Access: aneurysmal LU AVF +b/t   Filed Weights   06/21/21 1705 06/23/21 0923 06/25/21 0941  Weight: 69 kg 69.2 kg 69 kg    Intake/Output Summary (Last 24 hours) at 06/25/2021 1329 Last data filed at 06/25/2021 0453 Gross per 24 hour  Intake 560 ml  Output --  Net 560 ml    Additional Objective Labs: Basic Metabolic Panel: Recent Labs  Lab 06/21/21 0909 06/25/21 0800  NA 127* 132*  K 4.2 4.5  CL 92* 94*  CO2 23 26  GLUCOSE 127* 119*  BUN 78* 46*  CREATININE 8.36* 6.41*  CALCIUM 7.7* 7.8*  PHOS  --  2.3*   Liver Function Tests: Recent Labs  Lab 06/21/21 0909 06/25/21 0800  AST 18  --   ALT 15  --   ALKPHOS 67  --   BILITOT 0.6  --   PROT 5.3*  --   ALBUMIN 1.6* 1.6*   CBC: Recent Labs  Lab 06/21/21 0909 06/23/21 0800 06/25/21 0800  WBC 16.1* 14.8* 14.6*  HGB 8.5* 8.0* 7.3*  HCT 24.8* 24.4* 22.1*  MCV 86.1 88.1 88.8  PLT 529* 529* 533*   Blood Culture    Component Value Date/Time   SDES PERITONEAL FLUID 06/22/2021 1236   SPECREQUEST ABDOMEN 06/22/2021 1236   CULT  06/22/2021 1236    NO GROWTH 3 DAYS Performed at [facility] [address] [place], [place] [address]    REPTSTATUS PENDING 06/22/2021 1236    Studies/Results: CT Angio  Abd/Pel w/ and/or w/o  Result Date: 06/25/2021 CLINICAL DATA:  Chronic mesenteric ischemia EXAM: CTA ABDOMEN AND PELVIS WITHOUT AND WITH CONTRAST TECHNIQUE: Multidetector CT imaging of the abdomen and pelvis was performed using the standard protocol during bolus administration of intravenous contrast. Multiplanar reconstructed images and MIPs were obtained and reviewed to evaluate the vascular anatomy. CONTRAST:  79mL OMNIPAQUE IOHEXOL 350 MG/ML SOLN COMPARISON:  Recent CT abdomen/pelvis 06/17/2021 FINDINGS: VASCULAR Aorta: Calcified atherosclerotic plaque.  No aneurysm or dissection. Celiac: Patent without evidence of aneurysm, dissection, vasculitis or significant stenosis. SMA: Patent without evidence of aneurysm, dissection, vasculitis or significant stenosis. Renals: Both renal arteries are patent without evidence of aneurysm, dissection, vasculitis, fibromuscular dysplasia or significant stenosis. IMA: Patent without evidence of aneurysm, dissection, vasculitis or significant stenosis. Inflow: Patent without evidence of aneurysm, dissection, vasculitis or significant stenosis. Proximal Outflow: Bilateral common femoral and visualized portions of the superficial and profunda femoral arteries are patent without evidence of aneurysm, dissection, vasculitis or significant stenosis. Veins: Widely patent hepatic, portal venous, mesenteric and renal veins. No evidence of ileo caval DVT. Review of the MIP images confirms the above findings. NON-VASCULAR Lower  chest: Patchy foci of ground-glass attenuation airspace opacity present in the periphery of the visualized upper lungs bilaterally. Findings appear progressed compared to 06/17/2021. Stable chronic atelectasis versus pleuroparenchymal scarring in the bilateral lower lobes. Persistent cardiomegaly with left ventricular dilatation. Calcifications present throughout the coronary arteries. Hepatobiliary: No focal liver abnormality is seen. No gallstones,  gallbladder wall thickening, or biliary dilatation. Pancreas: Nonspecific dilation of the main pancreatic duct of 5 mm as previously noted. No discrete mass or obstructing lesion. Spleen: Normal in size without focal abnormality. Adrenals/Urinary Tract: Unremarkable adrenal glands. 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 The 3 cm cyst in the interpolar right kidney appears more water attenuation on today's exam. The ureters are not well seen. The bladder is completely decompressed. Stomach/Bowel: Similar appearance of diffuse mild submucosal edema throughout the jejunum. The bowel is also mildly dilated. Surgical changes suggest prior right hemicolectomy. Lymphatic: No suspicious lymphadenopathy. Reproductive: Prostate is unremarkable. Other: Loculated ascites with increasing enhancement and thickening of the peritoneal lining. 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 No acute fracture or aggressive appearing lytic or blastic osseous lesion. IMPRESSION: VASCULAR 1. Scattered atherosclerotic plaque without evidence of hemodynamically significant stenosis, or occlusion. No focal venous abnormality. 2.  Aortic Atherosclerosis (ICD10-I70.0). NON-VASCULAR 1. Persistent diffuse mild submucosal edema and dilation throughout the jejunum in the left hemiabdomen. Differential considerations include infectious or inflammatory enteritis. Given jejunal distribution, celiac sprue is a consideration. 2. Persistent ascites with increasing loculation and enhancement/thickening of the peritoneal lining. Spontaneous bacterial peritonitis is difficult to exclude by imaging. 3. Progressive multifocal patchy foci of ground-glass attenuation airspace opacity in the visualized portion of the upper lungs bilaterally. Findings remain concerning for an active infectious/inflammatory process including  COVID pneumonia. 4. The previously described intermediate attenuation lesion in the right interpolar kidney appears more water attenuation on today's examination favoring a mildly complex cyst over a solid renal neoplasm. 5. Additional ancillary findings as above without significant interval change. Electronically Signed   By: Jacqulynn Cadet M.D.   On: 06/25/2021 09:44    Medications:   (feeding supplement) PROSource Plus  30 mL Oral BID BM   bisacodyl  10 mg Rectal Daily   buPROPion  150 mg Oral Daily   Chlorhexidine Gluconate Cloth  6 each Topical Q0600   [START ON 06/30/2021] darbepoetin (ARANESP) injection - DIALYSIS  60 mcg Intravenous Q Thu-HD   diltiazem  360 mg Oral Daily   enoxaparin (LOVENOX) injection  30 mg Subcutaneous Q24H   feeding supplement (NEPRO CARB STEADY)  237 mL Oral TID BM   ferric citrate  420 mg Oral TID WC   hydrALAZINE  25 mg Oral TID   lidocaine  1 patch Transdermal Q24H   multivitamin  1 tablet Oral QHS   ondansetron (ZOFRAN) IV  4 mg Intravenous Once   pantoprazole  20 mg Oral Daily   polyethylene glycol  17 g Oral BID   senna  1 tablet Oral Daily    Dialysis Orders: TTS at [facility] 3:45 min 180NRe 450/Autoflow 1.5 2.0K/2.0 Ca 65 kgs  UFP 4 AVF - No heparin - Mircera 50 mcg IV q 4 weeks (Last dose 05/12/2021)   Assessment/Plan: Ileus: Resolving. Diet has been advanced E Coli peritonitis/ new ascites: Cx 8/29 +. On Ceftriaxone. Surgery following for GB sludge/wall thickening, no surgical plans. Paracentesis on 9/2 with improving cell count (although still high), Cx NGTD. Repeat para on 9/6-1L removed-cx  with pathology indicating acute inflammation. Per previous note:Nephrogenic ascites is not really a diagnosis, but vol excess in general can contribute to ascites in some ESRD pts. CT Angio today with "Persistent ascites with increasing loculation and enhancement/thickening of the peritoneal lining".  Per primary. Recent SBO/ sp exlap/ LOA - on 05/23/21. CTA  today with Persistent diffuse mild submucosal edema and dilation throughout the jejunum in the left hemiabdomen. Differential considerations include infectious or inflammatory enteritis vs celiac sprue. ESRD: HD TTS. Plan for HD today per regular schedule.   Anemia: Hgb trending downward-now 8.0, continue Aranesp q Thursday-will raise dose today. Holding IV iron for now. Monitor trends Secondary hyperparathyroidism: Ca ok, Phos slightly low today.  Continue Auryxia. HTN/volume: BP elevated, 4kg over dry if weights correct.  Likely has loss weight and needs dry weight lowered.  Plan for net UF 4.5L today.  Lower dry on d/c.   Nutrition: Alb 1.7 (low), continue protein supplements. R renal mass: on CTA today had more fluid filled appearance likely representing mildly complex cyst vs solid neoplasm.  Would benefit from outpatient Urology follow up. Hep C Suicidal Ideation: cleared by psych  Jen Mow, PA-C [name] 06/25/2021,1:29 PM  LOS: 13 days

## 2021-06-26 LAB — BODY FLUID CULTURE W GRAM STAIN: Culture: NO GROWTH

## 2021-06-28 MED FILL — Hepatitis B Vaccine Recomb Adjuvanted Pref Syr 20 MCG/0.5ML: INTRAMUSCULAR | Qty: 0.5 | Status: AC

## 2021-08-12 ENCOUNTER — Other Ambulatory Visit: Payer: Self-pay | Admitting: Gastroenterology

## 2021-08-12 DIAGNOSIS — K8689 Other specified diseases of pancreas: Secondary | ICD-10-CM

## 2021-08-19 ENCOUNTER — Telehealth: Payer: Self-pay | Admitting: Physician Assistant

## 2021-08-19 NOTE — Telephone Encounter (Signed)
Scheduled appt per 10/31 referral. Spoke to pt. He is aware of appt date and time. He asked if I could mail him a letter with his appointment information. I said yes and verified his address with him. Letter was sent out today.

## 2021-09-07 ENCOUNTER — Ambulatory Visit: Payer: Medicare HMO | Admitting: Physician Assistant

## 2021-09-07 ENCOUNTER — Other Ambulatory Visit: Payer: Medicare HMO

## 2021-09-30 ENCOUNTER — Encounter: Payer: Self-pay | Admitting: Physical Medicine and Rehabilitation

## 2021-10-05 ENCOUNTER — Emergency Department (HOSPITAL_COMMUNITY): Payer: Medicare (Managed Care)

## 2021-10-05 ENCOUNTER — Inpatient Hospital Stay (HOSPITAL_COMMUNITY)
Admission: EM | Admit: 2021-10-05 | Discharge: 2021-10-09 | DRG: 291 | Disposition: A | Discharge status: Home / Self Care | Payer: Medicare (Managed Care) | Attending: Internal Medicine | Admitting: Internal Medicine

## 2021-10-05 ENCOUNTER — Encounter (HOSPITAL_COMMUNITY): Payer: Self-pay | Admitting: Emergency Medicine

## 2021-10-05 ENCOUNTER — Observation Stay (HOSPITAL_COMMUNITY): Payer: Medicare (Managed Care)

## 2021-10-05 DIAGNOSIS — F410 Panic disorder [episodic paroxysmal anxiety] without agoraphobia: Secondary | ICD-10-CM | POA: Diagnosis present

## 2021-10-05 DIAGNOSIS — I5043 Acute on chronic combined systolic (congestive) and diastolic (congestive) heart failure: Secondary | ICD-10-CM | POA: Diagnosis present

## 2021-10-05 DIAGNOSIS — I712 Thoracic aortic aneurysm, without rupture, unspecified: Secondary | ICD-10-CM | POA: Diagnosis present

## 2021-10-05 DIAGNOSIS — N289 Disorder of kidney and ureter, unspecified: Secondary | ICD-10-CM | POA: Diagnosis not present

## 2021-10-05 DIAGNOSIS — Z634 Disappearance and death of family member: Secondary | ICD-10-CM

## 2021-10-05 DIAGNOSIS — N2581 Secondary hyperparathyroidism of renal origin: Secondary | ICD-10-CM | POA: Diagnosis present

## 2021-10-05 DIAGNOSIS — Z85038 Personal history of other malignant neoplasm of large intestine: Secondary | ICD-10-CM

## 2021-10-05 DIAGNOSIS — N186 End stage renal disease: Secondary | ICD-10-CM | POA: Diagnosis present

## 2021-10-05 DIAGNOSIS — Z20822 Contact with and (suspected) exposure to covid-19: Secondary | ICD-10-CM | POA: Diagnosis present

## 2021-10-05 DIAGNOSIS — J9801 Acute bronchospasm: Secondary | ICD-10-CM | POA: Diagnosis present

## 2021-10-05 DIAGNOSIS — I132 Hypertensive heart and chronic kidney disease with heart failure and with stage 5 chronic kidney disease, or end stage renal disease: Secondary | ICD-10-CM | POA: Diagnosis present

## 2021-10-05 DIAGNOSIS — G9341 Metabolic encephalopathy: Secondary | ICD-10-CM | POA: Diagnosis present

## 2021-10-05 DIAGNOSIS — J189 Pneumonia, unspecified organism: Secondary | ICD-10-CM

## 2021-10-05 DIAGNOSIS — I429 Cardiomyopathy, unspecified: Secondary | ICD-10-CM | POA: Diagnosis present

## 2021-10-05 DIAGNOSIS — I953 Hypotension of hemodialysis: Secondary | ICD-10-CM | POA: Diagnosis present

## 2021-10-05 DIAGNOSIS — I5023 Acute on chronic systolic (congestive) heart failure: Secondary | ICD-10-CM | POA: Diagnosis not present

## 2021-10-05 DIAGNOSIS — I5042 Chronic combined systolic (congestive) and diastolic (congestive) heart failure: Secondary | ICD-10-CM

## 2021-10-05 DIAGNOSIS — R7989 Other specified abnormal findings of blood chemistry: Secondary | ICD-10-CM | POA: Diagnosis present

## 2021-10-05 DIAGNOSIS — D631 Anemia in chronic kidney disease: Secondary | ICD-10-CM | POA: Diagnosis present

## 2021-10-05 DIAGNOSIS — F17210 Nicotine dependence, cigarettes, uncomplicated: Secondary | ICD-10-CM | POA: Diagnosis present

## 2021-10-05 DIAGNOSIS — M898X9 Other specified disorders of bone, unspecified site: Secondary | ICD-10-CM | POA: Diagnosis present

## 2021-10-05 DIAGNOSIS — I2729 Other secondary pulmonary hypertension: Secondary | ICD-10-CM | POA: Diagnosis present

## 2021-10-05 DIAGNOSIS — Z833 Family history of diabetes mellitus: Secondary | ICD-10-CM | POA: Diagnosis not present

## 2021-10-05 DIAGNOSIS — I3139 Other pericardial effusion (noninflammatory): Secondary | ICD-10-CM | POA: Diagnosis present

## 2021-10-05 DIAGNOSIS — R011 Cardiac murmur, unspecified: Secondary | ICD-10-CM | POA: Diagnosis not present

## 2021-10-05 DIAGNOSIS — Z8249 Family history of ischemic heart disease and other diseases of the circulatory system: Secondary | ICD-10-CM

## 2021-10-05 DIAGNOSIS — I10 Essential (primary) hypertension: Secondary | ICD-10-CM | POA: Diagnosis not present

## 2021-10-05 DIAGNOSIS — E8770 Fluid overload, unspecified: Secondary | ICD-10-CM | POA: Diagnosis not present

## 2021-10-05 DIAGNOSIS — Z91199 Patient's noncompliance with other medical treatment and regimen due to unspecified reason: Secondary | ICD-10-CM

## 2021-10-05 DIAGNOSIS — R0602 Shortness of breath: Secondary | ICD-10-CM | POA: Diagnosis present

## 2021-10-05 DIAGNOSIS — F1910 Other psychoactive substance abuse, uncomplicated: Secondary | ICD-10-CM | POA: Diagnosis present

## 2021-10-05 DIAGNOSIS — I5082 Biventricular heart failure: Secondary | ICD-10-CM | POA: Diagnosis present

## 2021-10-05 DIAGNOSIS — Z79899 Other long term (current) drug therapy: Secondary | ICD-10-CM

## 2021-10-05 DIAGNOSIS — Z841 Family history of disorders of kidney and ureter: Secondary | ICD-10-CM

## 2021-10-05 DIAGNOSIS — N400 Enlarged prostate without lower urinary tract symptoms: Secondary | ICD-10-CM | POA: Diagnosis present

## 2021-10-05 DIAGNOSIS — J690 Pneumonitis due to inhalation of food and vomit: Secondary | ICD-10-CM | POA: Diagnosis present

## 2021-10-05 DIAGNOSIS — Z9181 History of falling: Secondary | ICD-10-CM

## 2021-10-05 DIAGNOSIS — Z992 Dependence on renal dialysis: Secondary | ICD-10-CM

## 2021-10-05 LAB — CBC
HCT: 33.2 % — ABNORMAL LOW (ref 39.0–52.0)
HCT: 33.3 % — ABNORMAL LOW (ref 39.0–52.0)
Hemoglobin: 10.8 g/dL — ABNORMAL LOW (ref 13.0–17.0)
Hemoglobin: 10.8 g/dL — ABNORMAL LOW (ref 13.0–17.0)
MCH: 26.9 pg (ref 26.0–34.0)
MCH: 26.5 pg (ref 26.0–34.0)
MCHC: 32.5 g/dL (ref 30.0–36.0)
MCHC: 32.4 g/dL (ref 30.0–36.0)
MCV: 82.8 fL (ref 80.0–100.0)
MCV: 81.8 fL (ref 80.0–100.0)
Platelets: 257 10*3/uL (ref 150–400)
Platelets: 290 10*3/uL (ref 150–400)
RBC: 4.01 MIL/uL — ABNORMAL LOW (ref 4.22–5.81)
RBC: 4.07 MIL/uL — ABNORMAL LOW (ref 4.22–5.81)
RDW: 19.8 % — ABNORMAL HIGH (ref 11.5–15.5)
RDW: 19.6 % — ABNORMAL HIGH (ref 11.5–15.5)
WBC: 9.1 10*3/uL (ref 4.0–10.5)
WBC: 9 10*3/uL (ref 4.0–10.5)
nRBC: 0 % (ref 0.0–0.2)
nRBC: 0 % (ref 0.0–0.2)

## 2021-10-05 LAB — BASIC METABOLIC PANEL
Anion gap: 14 (ref 5–15)
BUN: 43 mg/dL — ABNORMAL HIGH (ref 6–20)
CO2: 26 mmol/L (ref 22–32)
Calcium: 8.5 mg/dL — ABNORMAL LOW (ref 8.9–10.3)
Chloride: 93 mmol/L — ABNORMAL LOW (ref 98–111)
Creatinine, Ser: 5.64 mg/dL — ABNORMAL HIGH (ref 0.61–1.24)
GFR, Estimated: 11 mL/min — ABNORMAL LOW (ref >60)
Glucose, Bld: 98 mg/dL (ref 70–99)
Potassium: 3.8 mmol/L (ref 3.5–5.1)
Sodium: 133 mmol/L — ABNORMAL LOW (ref 135–145)

## 2021-10-05 LAB — HEPATITIS B SURFACE ANTIGEN: Hepatitis B Surface Ag: NONREACTIVE

## 2021-10-05 LAB — RESP PANEL BY RT-PCR (FLU A&B, COVID) ARPGX2
Influenza A by PCR: NEGATIVE
Influenza B by PCR: NEGATIVE
SARS Coronavirus 2 by RT PCR: NEGATIVE

## 2021-10-05 LAB — ECHOCARDIOGRAM LIMITED
Height: 73 in
Weight: 2400 oz

## 2021-10-05 LAB — PROCALCITONIN: Procalcitonin: 97.44 ng/mL

## 2021-10-05 LAB — TROPONIN I (HIGH SENSITIVITY)
Troponin I (High Sensitivity): 167 ng/L (ref <18)
Troponin I (High Sensitivity): 156 ng/L (ref <18)

## 2021-10-05 LAB — BRAIN NATRIURETIC PEPTIDE: B Natriuretic Peptide: >4500 pg/mL — ABNORMAL HIGH (ref 0.0–100.0)

## 2021-10-05 LAB — HEPATITIS B SURFACE ANTIBODY,QUALITATIVE: Hep B S Ab: REACTIVE — AB

## 2021-10-05 MED ORDER — PENTAFLUOROPROP-TETRAFLUOROETH EX AERO
1.0000 "application " | INHALATION_SPRAY | CUTANEOUS | Status: DC | PRN
  Filled 2021-10-05: qty 116

## 2021-10-05 MED ORDER — IPRATROPIUM-ALBUTEROL 0.5-2.5 (3) MG/3ML IN SOLN
3.0000 mL | Freq: Once | RESPIRATORY_TRACT | Status: AC
  Administered 2021-10-05: 10:00:00 3 mL via RESPIRATORY_TRACT
  Filled 2021-10-05: qty 3

## 2021-10-05 MED ORDER — LIDOCAINE-PRILOCAINE 2.5-2.5 % EX CREA
1.0000 "application " | TOPICAL_CREAM | CUTANEOUS | Status: DC | PRN
  Filled 2021-10-05: qty 5

## 2021-10-05 MED ORDER — CAMPHOR-MENTHOL 0.5-0.5 % EX LOTN: 1.0000 "application " | TOPICAL_LOTION | Freq: Three times a day (TID) | CUTANEOUS | Status: DC | PRN

## 2021-10-05 MED ORDER — HEPARIN SODIUM (PORCINE) 1000 UNIT/ML DIALYSIS
1000.0000 [IU] | INTRAMUSCULAR | Status: DC | PRN
  Filled 2021-10-05: qty 1

## 2021-10-05 MED ORDER — METHYLPREDNISOLONE SODIUM SUCC 125 MG IJ SOLR
125.0000 mg | Freq: Once | INTRAMUSCULAR | Status: AC
  Administered 2021-10-05: 11:00:00 125 mg via INTRAVENOUS
  Filled 2021-10-05: qty 2

## 2021-10-05 MED ORDER — CALCIUM CARBONATE ANTACID 1250 MG/5ML PO SUSP
500.0000 mg | Freq: Four times a day (QID) | ORAL | Status: DC | PRN
  Filled 2021-10-05: qty 5

## 2021-10-05 MED ORDER — DOCUSATE SODIUM 283 MG RE ENEM: 1.0000 | ENEMA | RECTAL | Status: DC | PRN

## 2021-10-05 MED ORDER — HEPARIN SODIUM (PORCINE) 5000 UNIT/ML IJ SOLN
5000.0000 [IU] | Freq: Three times a day (TID) | INTRAMUSCULAR | Status: DC
  Administered 2021-10-05 – 2021-10-08 (×5): 5000 [IU] via SUBCUTANEOUS
  Filled 2021-10-05 (×6): qty 1

## 2021-10-05 MED ORDER — ACETAMINOPHEN 650 MG RE SUPP: 650.0000 mg | Freq: Four times a day (QID) | RECTAL | Status: DC | PRN

## 2021-10-05 MED ORDER — ASPIRIN 81 MG PO CHEW
324.0000 mg | CHEWABLE_TABLET | Freq: Once | ORAL | Status: AC
  Administered 2021-10-05: 10:00:00 324 mg via ORAL
  Filled 2021-10-05: qty 4

## 2021-10-05 MED ORDER — ACETAMINOPHEN 325 MG PO TABS
650.0000 mg | ORAL_TABLET | Freq: Four times a day (QID) | ORAL | Status: DC | PRN
  Administered 2021-10-05 – 2021-10-08 (×2): 650 mg via ORAL
  Filled 2021-10-05 (×2): qty 2

## 2021-10-05 MED ORDER — ALBUTEROL SULFATE (2.5 MG/3ML) 0.083% IN NEBU
2.5000 mg | INHALATION_SOLUTION | RESPIRATORY_TRACT | Status: DC | PRN
  Administered 2021-10-09: 01:00:00 2.5 mg via RESPIRATORY_TRACT
  Filled 2021-10-05: qty 3

## 2021-10-05 MED ORDER — ONDANSETRON HCL 4 MG PO TABS: 4.0000 mg | ORAL_TABLET | Freq: Four times a day (QID) | ORAL | Status: DC | PRN

## 2021-10-05 MED ORDER — CHLORHEXIDINE GLUCONATE CLOTH 2 % EX PADS: 6.0000 | MEDICATED_PAD | Freq: Every day | CUTANEOUS | Status: DC

## 2021-10-05 MED ORDER — LIDOCAINE HCL (PF) 1 % IJ SOLN: 5.0000 mL | INTRAMUSCULAR | Status: DC | PRN

## 2021-10-05 MED ORDER — SODIUM CHLORIDE 0.9 % IV SOLN: 100.0000 mL | INTRAVENOUS | Status: DC | PRN

## 2021-10-05 MED ORDER — SODIUM CHLORIDE 0.9 % IV SOLN
500.0000 mg | Freq: Once | INTRAVENOUS | Status: AC
  Administered 2021-10-05: 12:00:00 500 mg via INTRAVENOUS
  Filled 2021-10-05: qty 5

## 2021-10-05 MED ORDER — HYDRALAZINE HCL 20 MG/ML IJ SOLN: 5.0000 mg | INTRAMUSCULAR | Status: DC | PRN

## 2021-10-05 MED ORDER — IOHEXOL 350 MG/ML SOLN
80.0000 mL | Freq: Once | INTRAVENOUS | Status: AC | PRN
  Administered 2021-10-05: 12:00:00 80 mL via INTRAVENOUS

## 2021-10-05 MED ORDER — SODIUM CHLORIDE 0.9 % IV SOLN
1.0000 g | Freq: Once | INTRAVENOUS | Status: AC
  Administered 2021-10-05: 11:00:00 1 g via INTRAVENOUS
  Filled 2021-10-05: qty 10

## 2021-10-05 MED ORDER — ONDANSETRON HCL 4 MG/2ML IJ SOLN: 4.0000 mg | Freq: Four times a day (QID) | INTRAMUSCULAR | Status: DC | PRN

## 2021-10-05 MED ORDER — SODIUM CHLORIDE 0.9% FLUSH
3.0000 mL | Freq: Two times a day (BID) | INTRAVENOUS | Status: DC
  Administered 2021-10-05 – 2021-10-06 (×3): 3 mL via INTRAVENOUS

## 2021-10-05 MED ORDER — NEPRO/CARBSTEADY PO LIQD
237.0000 mL | Freq: Three times a day (TID) | ORAL | Status: DC | PRN
  Filled 2021-10-05: qty 237

## 2021-10-05 MED ORDER — ZOLPIDEM TARTRATE 5 MG PO TABS
5.0000 mg | ORAL_TABLET | Freq: Every evening | ORAL | Status: DC | PRN
  Administered 2021-10-05 – 2021-10-08 (×3): 5 mg via ORAL
  Filled 2021-10-05 (×3): qty 1

## 2021-10-05 MED ORDER — SEVELAMER CARBONATE 800 MG PO TABS
1600.0000 mg | ORAL_TABLET | Freq: Three times a day (TID) | ORAL | Status: DC
  Administered 2021-10-05 – 2021-10-08 (×6): 1600 mg via ORAL
  Filled 2021-10-05 (×7): qty 2

## 2021-10-05 MED ORDER — SORBITOL 70 % SOLN: 30.0000 mL | Status: DC | PRN

## 2021-10-05 MED ORDER — HYDROXYZINE HCL 25 MG PO TABS: 25.0000 mg | ORAL_TABLET | Freq: Three times a day (TID) | ORAL | Status: DC | PRN

## 2021-10-05 MED ORDER — ALTEPLASE 2 MG IJ SOLR: 2.0000 mg | Freq: Once | INTRAMUSCULAR | Status: DC | PRN

## 2021-10-05 NOTE — Assessment & Plan Note (Addendum)
-  Suspect this is less related to heart failure and more related to insufficient fluid removed at HD - likely associated with hypotension during sessions -Since he is dialysis-dependent, he was unable to clear the excess fluid -He is currently significantly volume overloaded despite having routine HD yesterday -He is likely to benefit from serial HD  -Given loud friction rub auscultated at time of admission, stat echo was ordered

## 2021-10-05 NOTE — ED Provider Notes (Signed)
[facility] EMERGENCY DEPARTMENT Provider Note   CSN: [id number] Arrival date & time: 10/05/21  3825     History Chief Complaint  Patient presents with   Shortness of Breath    Timothy [name] is a 60 y.o. male.   Shortness of Breath Associated symptoms: chest pain, cough and wheezing   Associated symptoms: no abdominal pain, no ear pain, no fever, no headaches, no neck pain, no rash, no sore throat and no vomiting   Patient presents for shortness of breath. He has history of ESRD and gets dialysis on T, TH, and SA.  He does state that he had a full session of HD yesterday.  He has had worsening shortness of breath and cough for the past week.  He also endorses bilateral ankle swelling.  He states that he is anuric at baseline.  Other medical history is notable for HTN, CHF, polysubstance abuse, anemia.  He is not on blood thinning medication.      Past Medical History:  Diagnosis Date   Anemia of chronic kidney failure    BPH (benign prostatic hyperplasia)    Colon cancer ([place]) 2014   End stage renal disease on dialysis ([place]) 2017   Hypertension    Hypertensive heart disease with chronic diastolic congestive heart failure ([place]) 07/17/2016   Polysubstance abuse ([place])    History of heroin and marijuana use    Patient Active Problem List   Diagnosis Date Noted   Hypervolemia associated with renal insufficiency 10/05/2021   Pericardial effusion without cardiac tamponade 05/39/7673   Acute systolic CHF (congestive heart failure) ([place]) 10/05/2021   Elevated procalcitonin 10/05/2021   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/2021   Abdominal pain    Renal mass    Major depressive disorder, single episode, severe ([place]) 06/18/2021   Bacterial peritonitis ([place])    Other ascites    Protein-calorie malnutrition, severe 06/13/2021   Ileus ([place]) 06/11/2021   Small bowel obstruction ([place]) 05/20/2021   DOE (dyspnea on exertion) 10/25/2020   Orthopnea 10/25/2020   Open  wound of left hand 01/18/2018   Tibial fracture 01/14/2018   Tibial plateau fracture, left 01/13/2018   Malnutrition of moderate degree 09/20/2016   AKI (acute kidney injury) ([place])    History of colon cancer    Hyponatremia    Leukocytosis    Acute blood loss anemia    Acute metabolic encephalopathy 41/93/7902   ESRD on hemodialysis ([place]) 09/09/2016   Hypertensive emergency 09/09/2016   Acute respiratory failure with hypoxia ([place]) 09/09/2016   Acute pulmonary edema ([place]) 09/09/2016   Polysubstance abuse ([place]) 09/09/2016   Nonadherence to medical treatment 09/09/2016   Anemia due to chronic kidney disease 09/09/2016   Altered mental status 09/09/2016   Acute respiratory failure ([place])    Drug ingestion    Hypertensive heart and chronic kidney disease with heart failure and with stage 5 chronic kidney disease, or end stage renal disease ([place]) 07/19/2016   Chest pain    Elevated troponin 40/97/3532   Acute diastolic heart failure, NYHA class 2 ([place]) 07/17/2016   Heroin abuse ([place]) 07/17/2016   HTN (hypertension) 07/17/2016   Benign prostate hyperplasia 07/16/2015    Past Surgical History:  Procedure Laterality Date   ABDOMINAL SURGERY     AV FISTULA PLACEMENT Left 09/19/2016   Procedure: Left arm Radiocephalic ARTERIOVENOUS (AV) FISTULA CREATION;  Surgeon: Conrad Meridian, MD;  Location: [facility] OR;  Service: Vascular;  Laterality: Left;   BASCILIC VEIN TRANSPOSITION Left 07/09/2017  Procedure: BRACHIOCEPHALIC FISTULA CREATION;  Surgeon: Conrad Vernon, MD;  Location: [place];  Service: Vascular;  Laterality: Left;   COLON SURGERY  2014   INSERTION OF DIALYSIS CATHETER N/A 09/19/2016   Procedure: INSERTION OF TUNNELED DIALYSIS CATHETER;  Surgeon: Conrad Mapleton, MD;  Location: [place];  Service: Vascular;  Laterality: N/A;   IR GENERIC HISTORICAL  09/14/2016   IR US GUIDE VASC ACCESS RIGHT 09/14/2016 [name], DO [facility]-INTER[facility] RAD   IR GENERIC HISTORICAL  09/14/2016   IR FLUORO GUIDE CV LINE  RIGHT 09/14/2016 [name], DO [facility]   IR PARACENTESIS  06/22/2021   LAPAROTOMY N/A 05/23/2021   Procedure: EXPLORATORY LAPAROTOMY LYSIS ADHESIONS;  Surgeon: [name], MD;  Location: [place];  Service: General;  Laterality: N/A;   ORIF TIBIA PLATEAU Left 01/15/2018   Procedure: OPEN REDUCTION INTERNAL FIXATION (ORIF) TIBIAL PLATEAU;  Surgeon: Altamese , MD;  Location: [place];  Service: Orthopedics;  Laterality: Left;   TRANSTHORACIC ECHOCARDIOGRAM  07/2016    EF 60-65%, No RWMA. Mod Concentric LVH - Gr 2 DD. Severe LA dilation. PAP ~35 mmHg (mild Pulm HTN)  --> no changes noted 1 month later       Family History  Problem Relation Age of Onset   Heart failure Mother        Died at age 87.   Heart attack Mother 65   Hypertension Mother    Diabetes Mellitus II Mother    Other Father        Unknown   Kidney failure Sister        (Oldest Sister)   Other Other        Multiple siblings have started her heart disease, he is not sure of the details.   CAD Nephew     Social History   Tobacco Use   Smoking status: Some Days    Packs/day: 0.25    Types: Cigarettes   Smokeless tobacco: Never  Vaping Use   Vaping Use: Never used  Substance Use Topics   Alcohol use: No    Alcohol/week: 7.0 standard drinks    Types: 7 Cans of beer per week   Drug use: Yes    Frequency: 1.0 times per week    Types: Marijuana, Heroin    Comment: reports quitting heroin 15 years ago; can't afford daily marijuana    Home Medications Prior to Admission medications   Medication Sig Start Date End Date Taking? Authorizing Provider  diltiazem (CARDIZEM CD) 360 MG 24 hr capsule Take 360 mg by mouth daily. 06/19/17  Yes [provider]  sevelamer carbonate (RENVELA) 800 MG tablet Take 2 tablets by mouth with breakfast, with lunch, and with evening meal. 05/12/21  Yes [provider]    Allergies    No known allergies  Review of Systems   Review of Systems   Constitutional:  Positive for chills and fatigue. Negative for activity change, appetite change and fever.  HENT:  Positive for congestion. Negative for ear pain, sore throat and trouble swallowing.   Eyes:  Negative for pain and visual disturbance.  Respiratory:  Positive for cough, chest tightness, shortness of breath and wheezing.   Cardiovascular:  Positive for chest pain and leg swelling. Negative for palpitations.  Gastrointestinal:  Negative for abdominal pain, diarrhea, nausea and vomiting.  Genitourinary:  Negative for dysuria, flank pain and hematuria.  Musculoskeletal:  Positive for back pain (chronic). Negative for arthralgias, joint swelling, myalgias and neck pain.  Skin:  Negative for color change and rash.  Neurological:  Negative for dizziness, seizures, syncope, weakness, light-headedness, numbness and headaches.  Hematological:  Does not bruise/bleed easily.  All other systems reviewed and are negative.  Physical Exam Updated Vital Signs BP (!) 174/110    Pulse 79    Temp 98.1 F (36.7 C) (Oral)    Resp 20    Ht 6\' 1"  (1.854 m)    Wt 68 kg    SpO2 96%    BMI 19.79 kg/m   Physical Exam Vitals and nursing note reviewed.  Constitutional:      General: He is not in acute distress.    Appearance: He is well-developed. He is ill-appearing.  HENT:     Head: Normocephalic and atraumatic.     Mouth/Throat:     Mouth: Mucous membranes are moist.  Eyes:     Conjunctiva/sclera: Conjunctivae normal.  Neck:     Vascular: JVD present.  Cardiovascular:     Rate and Rhythm: Normal rate and regular rhythm.     Heart sounds:    Friction rub present.  Pulmonary:     Effort: Tachypnea, accessory muscle usage and respiratory distress present.     Breath sounds: Wheezing and rhonchi present.  Chest:     Chest wall: No tenderness.  Abdominal:     Palpations: Abdomen is soft.     Tenderness: There is no abdominal tenderness.  Musculoskeletal:        General: No swelling.      Cervical back: Neck supple.     Right lower leg: Edema present.     Left lower leg: Edema present.  Skin:    General: Skin is warm and dry.     Coloration: Skin is not cyanotic or pale.  Neurological:     General: No focal deficit present.     Mental Status: He is alert and oriented to person, place, and time.     Cranial Nerves: No cranial nerve deficit.     Motor: No weakness.  Psychiatric:        Mood and Affect: Mood is anxious.        Behavior: Behavior normal.    ED Results / Procedures / Treatments   Labs (all labs ordered are listed, but only abnormal results are displayed) Labs Reviewed  BASIC METABOLIC PANEL - Abnormal; Notable for the following components:      Result Value   Sodium 133 (*)    Chloride 93 (*)    BUN 43 (*)    Creatinine, Ser 5.64 (*)    Calcium 8.5 (*)    GFR, Estimated 11 (*)    All other components within normal limits  CBC - Abnormal; Notable for the following components:   RBC 4.01 (*)    Hemoglobin 10.8 (*)    HCT 33.2 (*)    RDW 19.8 (*)    All other components within normal limits  BRAIN NATRIURETIC PEPTIDE - Abnormal; Notable for the following components:   B Natriuretic Peptide >4,500.0 (*)    All other components within normal limits  TROPONIN I (HIGH SENSITIVITY) - Abnormal; Notable for the following components:   Troponin I (High Sensitivity) 167 (*)    All other components within normal limits  TROPONIN I (HIGH SENSITIVITY) - Abnormal; Notable for the following components:   Troponin I (High Sensitivity) 156 (*)    All other components within normal limits  RESP PANEL BY RT-PCR (FLU A&B, COVID) ARPGX2  PROCALCITONIN  RAPID URINE DRUG SCREEN, HOSP PERFORMED  PROCALCITONIN  BASIC METABOLIC PANEL  CBC  CBC  HEPATITIS B SURFACE ANTIGEN  HEPATITIS B SURFACE ANTIBODY,QUALITATIVE  HEPATITIS B SURFACE ANTIBODY, QUANTITATIVE    EKG EKG Interpretation  Date/Time:  Wednesday October 05 2021 08:00:57 EST Ventricular Rate:   85 PR Interval:  210 QRS Duration: 84 QT Interval:  374 QTC Calculation: 445 R Axis:   38 Text Interpretation: Sinus rhythm with 1st degree A-V block ST & T wave abnormality, consider lateral ischemia Abnormal ECG Confirmed by Godfrey Pick [phone]) on 10/05/2021 9:04:10 AM  Radiology DG Chest 2 View  Result Date: 10/05/2021 CLINICAL DATA:  Chest pain and dyspnea EXAM: CHEST - 2 VIEW COMPARISON:  10/01/2020 chest radiograph. FINDINGS: New mild enlargement of the cardiopericardial silhouette. Otherwise normal mediastinal contour. No pneumothorax. No pleural effusion. Mild diffuse prominence of the parahilar interstitial markings. Mild curvilinear right lung base scarring versus atelectasis. IMPRESSION: New mild enlargement of the cardiopericardial silhouette. Mild diffuse prominence of the parahilar interstitial markings. Findings could represent mild congestive heart failure or atypical infection. Electronically Signed   By: Ilona Sorrel M.D.   On: 10/05/2021 09:05   CT Angio Chest PE W and/or Wo Contrast  Result Date: 10/05/2021 CLINICAL DATA:  Pulmonary embolism (PE) suspected, high prob. Dyspnea. EXAM: CT ANGIOGRAPHY CHEST WITH CONTRAST TECHNIQUE: Multidetector CT imaging of the chest was performed using the standard protocol during bolus administration of intravenous contrast. Multiplanar CT image reconstructions and MIPs were obtained to evaluate the vascular anatomy. CONTRAST:  80mL OMNIPAQUE IOHEXOL 350 MG/ML SOLN COMPARISON:  Chest radiograph from earlier today. 06/17/2021 chest CT. FINDINGS: Cardiovascular: The study is high quality for the evaluation of pulmonary embolism. There are no filling defects in the central, lobar, segmental or subsegmental pulmonary artery branches to suggest acute pulmonary embolism. Atherosclerotic thoracic aorta with dilated 4.3 cm ascending thoracic aorta, stable. Dilated main pulmonary artery (3.5 cm diameter), stable. Mild-to-moderate cardiomegaly, increased  from prior CT. Small pericardial effusion/thickening, increased from prior. Left anterior descending and right coronary atherosclerosis. Mediastinum/Nodes: No discrete thyroid nodules. Unremarkable esophagus. No axillary adenopathy. No pathologically enlarged mediastinal nodes. Mildly prominent 1.3 cm right hilar node (series 5/image 69), new. No additional enlarged hilar nodes. Lungs/Pleura: No pneumothorax. No pleural effusion. Scattered mild platelike scarring versus atelectasis throughout both lungs. Mild diffuse interlobular septal thickening. Mild patchy ground-glass opacity throughout both lungs. Finely nodular patchy foci of peribronchovascular consolidation throughout both lungs, most prominent in basilar right upper lobe. No lung masses. Upper abdomen: Small volume upper abdominal ascites. Musculoskeletal: No aggressive appearing focal osseous lesions. Anasarca. Review of the MIP images confirms the above findings. IMPRESSION: 1. No pulmonary embolism. 2. Mild-to-moderate cardiomegaly, increased from prior CT. Mild interlobular septal thickening, mild patchy ground-glass opacity and finely nodular patchy peribronchovascular consolidation throughout both lungs. Anasarca. Small volume upper abdominal ascites. This constellation of findings is most suggestive of congestive heart failure with fluid overload. A component of atypical pneumonia is difficult to exclude by imaging, however is less favored. 3. Small pericardial effusion/thickening, increased from prior CT. 4. Dilated main pulmonary artery, stable, suggesting chronic pulmonary arterial hypertension. 5. Two-vessel coronary atherosclerosis. 6. Stable dilated 4.3 cm ascending thoracic aorta. Recommend annual imaging followup by CTA or MRA. This recommendation follows 2010 ACCF/AHA/AATS/ACR/ASA/SCA/SCAI/SIR/STS/SVM Guidelines for the Diagnosis and Management of Patients with Thoracic Aortic Disease. Circulation. 2010; 121: Q657-Q469. Aortic aneurysm NOS  (ICD10-I71.9). 7. Mild right hilar adenopathy, nonspecific, probably reactive. 8. Aortic Atherosclerosis (ICD10-I70.0). Electronically Signed   By: Corene Cornea  A Poff M.D.   On: 10/05/2021 12:29   ECHOCARDIOGRAM LIMITED  Result Date: 10/05/2021    ECHOCARDIOGRAM LIMITED REPORT   Patient Name:   REHAAN VILORIA Date of Exam: 10/05/2021 Medical Rec #:  [id number]       Height:       73.0 in Accession #:    [id number]      Weight:       150.0 lb Date of Birth:  [birth year]       BSA:          1.904 m Patient Age:    79 years        BP:           152/104 mmHg Patient Gender: M               HR:           79 bpm. Exam Location:  Inpatient Procedure: Limited Echo, Color Doppler and Cardiac Doppler Indications:    R01.1 Murmur  History:        Patient has prior history of Echocardiogram examinations, most                 recent 06/20/2021. Risk Factors:Hypertension and Polysubstance                 Abuse.  Sonographer:    Raquel Sarna Senior RDCS Referring Phys: [place]  1. Left ventricular ejection fraction, by estimation, is 40 to 45%. The left ventricle has mildly decreased function. The left ventricle demonstrates global hypokinesis. There is moderate concentric left ventricular hypertrophy. Left ventricular diastolic function could not be evaluated.  2. Right ventricular systolic function is low normal. The right ventricular size is moderately enlarged. Moderately increased right ventricular wall thickness. There is moderately elevated pulmonary artery systolic pressure.  3. A small pericardial effusion is present. The pericardial effusion is circumferential. There is no evidence of cardiac tamponade.  4. Moderate mitral valve regurgitation.  5. Tricuspid valve regurgitation is moderate to severe.  6. The aortic valve is tricuspid. There is mild calcification of the aortic valve. There is moderate thickening of the aortic valve. Aortic valve regurgitation is mild.  7. The inferior vena cava is dilated in  size with <50% respiratory variability, suggesting right atrial pressure of 15 mmHg. Comparison(s): A prior study was performed on 06/20/2021. The left ventricular function is worsened. The right ventricle is now dilated. There is marked worsening of the mitral insufficiency and the tricuspid insufficiency. There is a new small pericardial effusion. There is evidence of hypervolemia/ increased right atrial pressure FINDINGS  Left Ventricle: Left ventricular ejection fraction, by estimation, is 40 to 45%. The left ventricle has mildly decreased function. The left ventricle demonstrates global hypokinesis. The left ventricular internal cavity size was normal in size. There is  moderate concentric left ventricular hypertrophy. Left ventricular diastolic function could not be evaluated. Right Ventricle: The right ventricular size is moderately enlarged. Moderately increased right ventricular wall thickness. Right ventricular systolic function is low normal. There is moderately elevated pulmonary artery systolic pressure. The tricuspid regurgitant velocity is 3.40 m/s, and with an assumed right atrial pressure of 15 mmHg, the estimated right ventricular systolic pressure is 19.5 mmHg. Pericardium: A small pericardial effusion is present. The pericardial effusion is circumferential. There is no evidence of cardiac tamponade. Mitral Valve: Moderate mitral valve regurgitation, with centrally-directed jet. Tricuspid Valve: Tricuspid valve regurgitation is moderate to severe. Aortic Valve: The aortic valve is tricuspid. There is  mild calcification of the aortic valve. There is moderate thickening of the aortic valve. Aortic valve regurgitation is mild. Pulmonic Valve: The pulmonic valve was normal in structure. Pulmonic valve regurgitation is trivial. Venous: The inferior vena cava is dilated in size with less than 50% respiratory variability, suggesting right atrial pressure of 15 mmHg. RIGHT VENTRICLE TAPSE (M-mode): 1.5 cm  AORTIC VALVE LVOT Vmax:   168.00 cm/s LVOT Vmean:  106.000 cm/s LVOT VTI:    0.251 m TRICUSPID VALVE TR Peak grad:   46.2 mmHg TR Vmax:        340.00 cm/s  SHUNTS Systemic VTI: 0.25 m Dani Gobble Croitoru MD Electronically signed by Sanda Klein MD Signature Date/Time: 10/05/2021/3:42:54 PM    Final     Procedures Procedures   Medications Ordered in ED Medications  sevelamer carbonate (RENVELA) tablet 1,600 mg (1,600 mg Oral Given 10/05/21 1737)  acetaminophen (TYLENOL) tablet 650 mg (has no administration in time range)    Or  acetaminophen (TYLENOL) suppository 650 mg (has no administration in time range)  zolpidem (AMBIEN) tablet 5 mg (has no administration in time range)  sorbitol 70 % solution 30 mL (has no administration in time range)  docusate sodium (ENEMEEZ) enema 283 mg (has no administration in time range)  ondansetron (ZOFRAN) tablet 4 mg (has no administration in time range)    Or  ondansetron (ZOFRAN) injection 4 mg (has no administration in time range)  camphor-menthol (SARNA) lotion 1 application (has no administration in time range)    And  hydrOXYzine (ATARAX) tablet 25 mg (has no administration in time range)  calcium carbonate (dosed in mg elemental calcium) suspension 500 mg of elemental calcium (has no administration in time range)  feeding supplement (NEPRO CARB STEADY) liquid 237 mL (has no administration in time range)  heparin injection 5,000 Units (5,000 Units Subcutaneous Given 10/05/21 1531)  albuterol (PROVENTIL) (2.5 MG/3ML) 0.083% nebulizer solution 2.5 mg (has no administration in time range)  hydrALAZINE (APRESOLINE) injection 5 mg (has no administration in time range)  sodium chloride flush (NS) 0.9 % injection 3 mL (3 mLs Intravenous Given 10/05/21 1531)  Chlorhexidine Gluconate Cloth 2 % PADS 6 each (has no administration in time range)  pentafluoroprop-tetrafluoroeth (GEBAUERS) aerosol 1 application (has no administration in time range)  lidocaine (PF)  (XYLOCAINE) 1 % injection 5 mL (has no administration in time range)  lidocaine-prilocaine (EMLA) cream 1 application (has no administration in time range)  0.9 %  sodium chloride infusion (has no administration in time range)  0.9 %  sodium chloride infusion (has no administration in time range)  heparin injection 1,000 Units (has no administration in time range)  alteplase (CATHFLO ACTIVASE) injection 2 mg (has no administration in time range)  ipratropium-albuterol (DUONEB) 0.5-2.5 (3) MG/3ML nebulizer solution 3 mL (3 mLs Nebulization Given 10/05/21 0935)  methylPREDNISolone sodium succinate (SOLU-MEDROL) 125 mg/2 mL injection 125 mg (125 mg Intravenous Given 10/05/21 1120)  aspirin chewable tablet 324 mg (324 mg Oral Given 10/05/21 0934)  cefTRIAXone (ROCEPHIN) 1 g in sodium chloride 0.9 % 100 mL IVPB (0 g Intravenous Stopped 10/05/21 1149)  azithromycin (ZITHROMAX) 500 mg in sodium chloride 0.9 % 250 mL IVPB (0 mg Intravenous Stopped 10/05/21 1408)  iohexol (OMNIPAQUE) 350 MG/ML injection 80 mL (80 mLs Intravenous Contrast Given 10/05/21 1212)    ED Course  I have reviewed the triage vital signs and the nursing notes.  Pertinent labs & imaging results that were available during my care of the patient were reviewed  by me and considered in my medical decision making (see chart for details).    MDM Rules/Calculators/A&P                         CRITICAL CARE Performed by: Godfrey Pick   Total critical care time: 35 minutes  Critical care time was exclusive of separately billable procedures and treating other patients.  Critical care was necessary to treat or prevent imminent or life-threatening deterioration.  Critical care was time spent personally by me on the following activities: development of treatment plan with patient and/or surrogate as well as nursing, discussions with consultants, evaluation of patient's response to treatment, examination of patient, obtaining history from  patient or surrogate, ordering and performing treatments and interventions, ordering and review of laboratory studies, ordering and review of radiographic studies, pulse oximetry and re-evaluation of patient's condition.   60 year old male with history of ESRD, CHF, presenting for worsening shortness of breath.  On arrival in the ED, he is tachypneic with increased work of breathing.  Persistent cough is present.  On lung auscultation, patient has diffuse rhonchi and wheezing.  SPO2 is 92% on room air.  Per chart review, he does not have documented history of COPD or asthma.  Patient was treated for RAD with Solu-Medrol and DuoNeb.  He was also treated empirically for pneumonia.  Following breathing treatment, he did have significant improvement in his work of breathing.  Laboratory work-up shows the following: Baseline anemia, no leukocytosis, baseline elevated troponin, and severely elevated BNP.  CTA of chest was negative for PE.  It did have findings consistent with fluid overload.  Given that patient is anuric, he will require further dialysis for treatment of fluid overload.  He remained hemodynamically stable with improved oxygenation on room air.  Patient was admitted to medicine for further management.  Final Clinical Impression(s) / ED Diagnoses Final diagnoses:  SOB (shortness of breath)    Rx / DC Orders ED Discharge Orders     None        Godfrey Pick, MD 10/05/21 Bosie Helper

## 2021-10-05 NOTE — ED Notes (Signed)
IV team at bedside 

## 2021-10-05 NOTE — Assessment & Plan Note (Signed)
-  Patient does not have significant symptoms suggestive of infection -Imaging without clear evidence of infection -For now will monitor without antibiotics -This may be elevated due to inflammation, ?myocarditis/pericarditis - not mentioned on echo but he does have acute changes compared to prior so this is a consideration

## 2021-10-05 NOTE — ED Notes (Signed)
Pt demanded RN to take out IV bc pt is leaving AMA. RN took out IV and Dr was notified that pt wants to leave [place]. Dr talked to pt and now pt states he will stay for food.

## 2021-10-05 NOTE — ED Notes (Signed)
RN ordered pt lunch tray

## 2021-10-05 NOTE — Consult Note (Signed)
Cardiology Consultation:   Patient ID: Timothy [name] MRN: [id number]; DOB: May 19, 1961  Admit date: 10/05/2021 Date of Consult: 10/05/2021  PCP:  Timothy Mccreedy, MD   [facility] Cardiologist:  Dr Harding}     Patient Profile:   Timothy [name] is a 60 y.o. male with a hx of end-stage renal disease dialysis dependent, hypertension, colon cancer, benign prostatic hypertrophy, substance abuse who is being seen 10/05/2021 for the evaluation of acute on chronic combined systolic/diastolic heart failure at the request of Timothy Bongo, MD.  History of Present Illness:   Patient is followed by Dr. Ellyn Hack for hypertension and history of congestive heart failure.  Echocardiogram September 2022 showed normal LV function, moderate left ventricular hypertrophy, grade 1 diastolic dysfunction, moderate left atrial enlargement, trace mitral regurgitation, trace aortic insufficiency, mild aortic stenosis with mean gradient 10 mmHg and mildly dilated aortic root.  There was a small pericardial fusion.  Patient states that for the past month he has had occasions of PND and orthopnea.  Over the past 2 weeks he has noticed increased dyspnea on exertion and bilateral lower extremity edema.  He denies chest pain.  He has had a cough productive of yellow sputum but denies hemoptysis.  No fevers or chills.  He has been admitted for congestive heart failure and cardiology asked to evaluate.   Past Medical History:  Diagnosis Date   Anemia of chronic kidney failure    BPH (benign prostatic hyperplasia)    Colon cancer ([place]) 2014   End stage renal disease on dialysis ([place]) 2017   Hypertension    Hypertensive heart disease with chronic diastolic congestive heart failure ([place]) 07/17/2016   Polysubstance abuse ([place])    History of heroin and marijuana use    Past Surgical History:  Procedure Laterality Date   ABDOMINAL SURGERY     AV FISTULA PLACEMENT Left 09/19/2016   Procedure: Left arm  Radiocephalic ARTERIOVENOUS (AV) FISTULA CREATION;  Surgeon: Conrad Gretna, MD;  Location: [facility];  Service: Vascular;  Laterality: Left;   BASCILIC VEIN TRANSPOSITION Left 07/09/2017   Procedure: BRACHIOCEPHALIC FISTULA CREATION;  Surgeon: Conrad Unadilla, MD;  Location: [facility];  Service: Vascular;  Laterality: Left;   COLON SURGERY  2014   INSERTION OF DIALYSIS CATHETER N/A 09/19/2016   Procedure: INSERTION OF TUNNELED DIALYSIS CATHETER;  Surgeon: Conrad Fruitport, MD;  Location: [facility];  Service: Vascular;  Laterality: N/A;   IR GENERIC HISTORICAL  09/14/2016   IR US GUIDE VASC ACCESS RIGHT 09/14/2016 [name], DO [facility]   IR GENERIC HISTORICAL  09/14/2016   IR FLUORO GUIDE CV LINE RIGHT 09/14/2016 [name], DO [facility]   IR PARACENTESIS  06/22/2021   LAPAROTOMY N/A 05/23/2021   Procedure: EXPLORATORY LAPAROTOMY LYSIS ADHESIONS;  Surgeon: [name], MD;  Location: [facility];  Service: General;  Laterality: N/A;   ORIF TIBIA PLATEAU Left 01/15/2018   Procedure: OPEN REDUCTION INTERNAL FIXATION (ORIF) TIBIAL PLATEAU;  Surgeon: Altamese Random Lake, MD;  Location: [facility];  Service: Orthopedics;  Laterality: Left;   TRANSTHORACIC ECHOCARDIOGRAM  07/2016    EF 60-65%, No RWMA. Mod Concentric LVH - Gr 2 DD. Severe LA dilation. PAP ~35 mmHg (mild Pulm HTN)  --> no changes noted 1 month later       Inpatient Medications: Scheduled Meds:  [START ON 10/06/2021] Chlorhexidine Gluconate Cloth  6 each Topical Q0600   heparin  5,000 Units Subcutaneous Q8H   sevelamer carbonate  1,600 mg Oral TID with meals  sodium chloride flush  3 mL Intravenous Q12H   Continuous Infusions:  PRN Meds: acetaminophen **OR** acetaminophen, albuterol, calcium carbonate (dosed in mg elemental calcium), camphor-menthol **AND** hydrOXYzine, docusate sodium, feeding supplement (NEPRO CARB STEADY), hydrALAZINE, ondansetron **OR** ondansetron (ZOFRAN) IV, sorbitol, zolpidem  Allergies:    Allergies  Allergen  Reactions   No Known Allergies     Social History:   Social History   Socioeconomic History   Marital status: Married    Spouse name: Not on file   Number of children: Not on file   Years of education: Not on file   Highest education level: Not on file  Occupational History   Occupation: disabled  Tobacco Use   Smoking status: Some Days    Packs/day: 0.25    Types: Cigarettes   Smokeless tobacco: Never  Vaping Use   Vaping Use: Never used  Substance and Sexual Activity   Alcohol use: No    Alcohol/week: 7.0 standard drinks    Types: 7 Cans of beer per week   Drug use: Yes    Frequency: 1.0 times per week    Types: Marijuana, Heroin    Comment: reports quitting heroin 15 years ago; can't afford daily marijuana   Sexual activity: Not on file    Comment: once a week  Other Topics Concern   Not on file  Social History Narrative   Not on file   Social Determinants of Health   Financial Resource Strain: Not on file  Food Insecurity: Not on file  Transportation Needs: Not on file  Physical Activity: Not on file  Stress: Not on file  Social Connections: Not on file  Intimate Partner Violence: Not on file    Family History:    Family History  Problem Relation Age of Onset   Heart failure Mother        Died at age 55.   Heart attack Mother 35   Hypertension Mother    Diabetes Mellitus II Mother    Other Father        Unknown   Kidney failure Sister        (Oldest Sister)   Other Other        Multiple siblings have started her heart disease, he is not sure of the details.   CAD Nephew      ROS:  Please see the history of present illness.  Patient describes cough productive of yellow sputum.  No hemoptysis. All other ROS reviewed and negative.     Physical Exam/Data:   Vitals:   10/05/21 1337 10/05/21 1430 10/05/21 1515 10/05/21 1600  BP: (!) 156/83 (!) 152/104 (!) 158/105 (!) 155/115  Pulse: 80 86 80 85  Resp: 20 (!) 22 19 (!) 24  Temp:      TempSrc:       SpO2: 92% 93% 93% 94%  Weight:      Height:        Intake/Output Summary (Last 24 hours) at 10/05/2021 1734 Last data filed at 10/05/2021 1408 Gross per 24 hour  Intake 312.78 ml  Output --  Net 312.78 ml   Last 3 Weights 10/05/2021 06/25/2021 06/25/2021  Weight (lbs) 150 lb 143 lb 15.4 oz 152 lb 1.9 oz  Weight (kg) 68.04 kg 65.3 kg 69 kg     Body mass index is 19.79 kg/m.  General:  Well nourished, well developed, in no acute distress HEENT: normal Neck: positive JVD Vascular: No carotid bruits; Distal pulses 2+ bilaterally Cardiac:  normal S1, S2; RRR; loud rub Lungs:  Diffuse expiratory wheeze Abd: soft, nontender, no hepatomegaly  Ext: 1-2+ edema Musculoskeletal:  No deformities, BUE and BLE strength normal and equal Skin: warm and dry  Neuro:  CNs 2-12 intact, no focal abnormalities noted Psych:  Normal affect   EKG:  The EKG was personally reviewed and demonstrates: Normal sinus rhythm, significant baseline wander, first-degree AV block, nonspecific T wave changes. Telemetry:  Telemetry was personally reviewed and demonstrates: Sinus with PVCs  Laboratory Data:  High Sensitivity Troponin:   Recent Labs  Lab 10/05/21 0804 10/05/21 1100  TROPONINIHS 167* 156*     Chemistry Recent Labs  Lab 10/05/21 0804  NA 133*  K 3.8  CL 93*  CO2 26  GLUCOSE 98  BUN 43*  CREATININE 5.64*  CALCIUM 8.5*  GFRNONAA 11*  ANIONGAP 14     Hematology Recent Labs  Lab 10/05/21 0804  WBC 9.1  RBC 4.01*  HGB 10.8*  HCT 33.2*  MCV 82.8  MCH 26.9  MCHC 32.5  RDW 19.8*  PLT 257    BNP Recent Labs  Lab 10/05/21 0804  BNP >4,500.0*      Radiology/Studies:  DG Chest 2 View  Result Date: 10/05/2021 CLINICAL DATA:  Chest pain and dyspnea EXAM: CHEST - 2 VIEW COMPARISON:  10/01/2020 chest radiograph. FINDINGS: New mild enlargement of the cardiopericardial silhouette. Otherwise normal mediastinal contour. No pneumothorax. No pleural effusion. Mild diffuse  prominence of the parahilar interstitial markings. Mild curvilinear right lung base scarring versus atelectasis. IMPRESSION: New mild enlargement of the cardiopericardial silhouette. Mild diffuse prominence of the parahilar interstitial markings. Findings could represent mild congestive heart failure or atypical infection. Electronically Signed   By: Ilona Sorrel M.D.   On: 10/05/2021 09:05   CT Angio Chest PE W and/or Wo Contrast  Result Date: 10/05/2021 CLINICAL DATA:  Pulmonary embolism (PE) suspected, high prob. Dyspnea. EXAM: CT ANGIOGRAPHY CHEST WITH CONTRAST TECHNIQUE: Multidetector CT imaging of the chest was performed using the standard protocol during bolus administration of intravenous contrast. Multiplanar CT image reconstructions and MIPs were obtained to evaluate the vascular anatomy. CONTRAST:  94mL OMNIPAQUE IOHEXOL 350 MG/ML SOLN COMPARISON:  Chest radiograph from earlier today. 06/17/2021 chest CT. FINDINGS: Cardiovascular: The study is high quality for the evaluation of pulmonary embolism. There are no filling defects in the central, lobar, segmental or subsegmental pulmonary artery branches to suggest acute pulmonary embolism. Atherosclerotic thoracic aorta with dilated 4.3 cm ascending thoracic aorta, stable. Dilated main pulmonary artery (3.5 cm diameter), stable. Mild-to-moderate cardiomegaly, increased from prior CT. Small pericardial effusion/thickening, increased from prior. Left anterior descending and right coronary atherosclerosis. Mediastinum/Nodes: No discrete thyroid nodules. Unremarkable esophagus. No axillary adenopathy. No pathologically enlarged mediastinal nodes. Mildly prominent 1.3 cm right hilar node (series 5/image 69), new. No additional enlarged hilar nodes. Lungs/Pleura: No pneumothorax. No pleural effusion. Scattered mild platelike scarring versus atelectasis throughout both lungs. Mild diffuse interlobular septal thickening. Mild patchy ground-glass opacity  throughout both lungs. Finely nodular patchy foci of peribronchovascular consolidation throughout both lungs, most prominent in basilar right upper lobe. No lung masses. Upper abdomen: Small volume upper abdominal ascites. Musculoskeletal: No aggressive appearing focal osseous lesions. Anasarca. Review of the MIP images confirms the above findings. IMPRESSION: 1. No pulmonary embolism. 2. Mild-to-moderate cardiomegaly, increased from prior CT. Mild interlobular septal thickening, mild patchy ground-glass opacity and finely nodular patchy peribronchovascular consolidation throughout both lungs. Anasarca. Small volume upper abdominal ascites. This constellation of findings is most suggestive of  congestive heart failure with fluid overload. A component of atypical pneumonia is difficult to exclude by imaging, however is less favored. 3. Small pericardial effusion/thickening, increased from prior CT. 4. Dilated main pulmonary artery, stable, suggesting chronic pulmonary arterial hypertension. 5. Two-vessel coronary atherosclerosis. 6. Stable dilated 4.3 cm ascending thoracic aorta. Recommend annual imaging followup by CTA or MRA. This recommendation follows 2010 ACCF/AHA/AATS/ACR/ASA/SCA/SCAI/SIR/STS/SVM Guidelines for the Diagnosis and Management of Patients with Thoracic Aortic Disease. Circulation. 2010; 121: R916-B846. Aortic aneurysm NOS (ICD10-I71.9). 7. Mild right hilar adenopathy, nonspecific, probably reactive. 8. Aortic Atherosclerosis (ICD10-I70.0). Electronically Signed   By: Ilona Sorrel M.D.   On: 10/05/2021 12:29   ECHOCARDIOGRAM LIMITED  Result Date: 10/05/2021    ECHOCARDIOGRAM LIMITED REPORT   Patient Name:   Timothy [name] Date of Exam: 10/05/2021 Medical Rec #:  [id number]       Height:       73.0 in Accession #:    [id number]      Weight:       150.0 lb Date of Birth:  1961-09-08       BSA:          1.904 m Patient Age:    58 years        BP:           152/104 mmHg Patient Gender: M                HR:           79 bpm. Exam Location:  Inpatient Procedure: Limited Echo, Color Doppler and Cardiac Doppler Indications:    R01.1 Murmur  History:        Patient has prior history of Echocardiogram examinations, most                 recent 06/20/2021. Risk Factors:Hypertension and Polysubstance                 Abuse.  Sonographer:    Raquel Sarna Senior RDCS Referring Phys: [place]  1. Left ventricular ejection fraction, by estimation, is 40 to 45%. The left ventricle has mildly decreased function. The left ventricle demonstrates global hypokinesis. There is moderate concentric left ventricular hypertrophy. Left ventricular diastolic function could not be evaluated.  2. Right ventricular systolic function is low normal. The right ventricular size is moderately enlarged. Moderately increased right ventricular wall thickness. There is moderately elevated pulmonary artery systolic pressure.  3. A small pericardial effusion is present. The pericardial effusion is circumferential. There is no evidence of cardiac tamponade.  4. Moderate mitral valve regurgitation.  5. Tricuspid valve regurgitation is moderate to severe.  6. The aortic valve is tricuspid. There is mild calcification of the aortic valve. There is moderate thickening of the aortic valve. Aortic valve regurgitation is mild.  7. The inferior vena cava is dilated in size with <50% respiratory variability, suggesting right atrial pressure of 15 mmHg. Comparison(s): A prior study was performed on 06/20/2021. The left ventricular function is worsened. The right ventricle is now dilated. There is marked worsening of the mitral insufficiency and the tricuspid insufficiency. There is a new small pericardial effusion. There is evidence of hypervolemia/ increased right atrial pressure FINDINGS  Left Ventricle: Left ventricular ejection fraction, by estimation, is 40 to 45%. The left ventricle has mildly decreased function. The left ventricle  demonstrates global hypokinesis. The left ventricular internal cavity size was normal in size. There is  moderate concentric left ventricular hypertrophy. Left ventricular diastolic function could not  be evaluated. Right Ventricle: The right ventricular size is moderately enlarged. Moderately increased right ventricular wall thickness. Right ventricular systolic function is low normal. There is moderately elevated pulmonary artery systolic pressure. The tricuspid regurgitant velocity is 3.40 m/s, and with an assumed right atrial pressure of 15 mmHg, the estimated right ventricular systolic pressure is 23.7 mmHg. Pericardium: A small pericardial effusion is present. The pericardial effusion is circumferential. There is no evidence of cardiac tamponade. Mitral Valve: Moderate mitral valve regurgitation, with centrally-directed jet. Tricuspid Valve: Tricuspid valve regurgitation is moderate to severe. Aortic Valve: The aortic valve is tricuspid. There is mild calcification of the aortic valve. There is moderate thickening of the aortic valve. Aortic valve regurgitation is mild. Pulmonic Valve: The pulmonic valve was normal in structure. Pulmonic valve regurgitation is trivial. Venous: The inferior vena cava is dilated in size with less than 50% respiratory variability, suggesting right atrial pressure of 15 mmHg. RIGHT VENTRICLE TAPSE (M-mode): 1.5 cm AORTIC VALVE LVOT Vmax:   168.00 cm/s LVOT Vmean:  106.000 cm/s LVOT VTI:    0.251 m TRICUSPID VALVE TR Peak grad:   46.2 mmHg TR Vmax:        340.00 cm/s  SHUNTS Systemic VTI: 0.25 m Dani Gobble Croitoru MD Electronically signed by Sanda Klein MD Signature Date/Time: 10/05/2021/3:42:54 PM    Final      Assessment and Plan:   Acute on chronic combined systolic/diastolic congestive heart failure-patient presents with symptoms of congestive heart failure with orthopnea, PND and worsening bilateral lower extremity edema.  Also with dyspnea on exertion.  He is volume  overloaded on examination.  He does not make significant amounts of urine.  Will need volume removal per dialysis.  Nephrology has been consulted. Cardiomyopathy-LV function newly reduced.  Etiology unclear.  Blood pressure has been significantly elevated at presentation and I wonder if this could be contributing.  However he apparently has had difficulties with hypotension on dialysis.  We will not add additional medications at this point.  If blood pressure allows will consider addition of beta-blockade and ARB later.  We will plan cardiac CTA later to exclude coronary disease once CHF improves. Pericardial fusion-loud pericardial friction rub on exam.  However echocardiogram shows small pericardial effusion. 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  Worse compared to previous.  We will likely plan repeat echocardiogram in 3 months once his CHF improves. End-stage renal disease-dialysis per nephrology. Hypertension-blood pressure has been elevated.  Question if that is contributing to cardiomyopathy.  We will follow in-house particular dialysis days and add medications as needed. Possible URI-patient is complaining of cough productive of yellow sputum.  Management per primary care. Thoracic aortic aneurysm-he will need follow-up CT in 1 year.   Risk Assessment/Risk Scores:    New York Heart Association (NYHA) Functional Class NYHA Class III    [facility] will follow  For questions or updates, please contact [facility] Please consult [email or web address] for contact info under    Signed, Kirk Ruths, MD  10/05/2021 5:34 PM

## 2021-10-05 NOTE — Assessment & Plan Note (Signed)
-  STAT limited echo performed and showed small effusion (despite significant rub on exam) -No evidence of tamponade -This appears to be less likely related to hypotension during HD sessions

## 2021-10-05 NOTE — ED Notes (Addendum)
RN called pt 3x. No answer

## 2021-10-05 NOTE — Assessment & Plan Note (Signed)
-  Hold Cardizem for now -prn IV hydralazine

## 2021-10-05 NOTE — ED Triage Notes (Signed)
Pt reports SOB and feet swelling since finishing dialysis yesterday. Also having a dry cough. States he has been eating a lot of ice.

## 2021-10-05 NOTE — Consult Note (Signed)
[facility] Renal Consultation Note    Indication for Consultation:  Management of ESRD/hemodialysis; anemia, hypertension/volume and secondary hyperparathyroidism  HPI: Timothy [name] is a 60 y.o. male with a PMH significant for malignant HTN, chronic diastolic CHF, h/o polysubstance abuse, colon cancer s/p resection in 2014, SBO August 2022, medical noncompliance, and ESRD on HD TTS at [facility] who presented to [facility] ED with a 1 week history of worsening lower extremity edema and SOB.  Admits to eating a lot of ice.  He had HD yesterday and ran his full time with UF of 2.6 liters but still 3 kg above edw.  He often signs off early and has had some episodes of low BP during HD.  He also reports a dry cough for the past week but denies any fevers or chills.  He is followed by Cardiology for his CHF but hasn't been seen for almost a year.  In the ED he was noted to being tachypneic, with wheezing and course BS.  SpO2 was 91% on room air, VSS except for respiratory rate of 26.  He was given a breathing treatment in the ED.  BNP elevated at >4500, troponin I 167, WBC 9.1, Hgb 10.8, Na 133, K 3.8, Cl 93, Co2 26, BUN 43, Cr 5.64, Ca 8.5.  He was also noted to have a pericardial friction rub and an ECHO was ordered.  He is being admitted for volume overload/CHF and we were consulted to provide HD to help with fluid removal.  Past Medical History:  Diagnosis Date   Anemia of chronic kidney failure    BPH (benign prostatic hyperplasia)    Colon cancer ([place]) 2014   End stage renal disease on dialysis ([place]) 2017   Hypertension    Hypertensive heart disease with chronic diastolic congestive heart failure ([place]) 07/17/2016   Polysubstance abuse ([place])    History of heroin and marijuana use   Past Surgical History:  Procedure Laterality Date   ABDOMINAL SURGERY     AV FISTULA PLACEMENT Left 09/19/2016   Procedure: Left arm Radiocephalic ARTERIOVENOUS (AV) FISTULA CREATION;  Surgeon: Conrad Shillington, MD;   Location: [place];  Service: Vascular;  Laterality: Left;   [place] Left 07/09/2017   Procedure: BRACHIOCEPHALIC FISTULA CREATION;  Surgeon: Conrad [place], MD;  Location: [place];  Service: Vascular;  Laterality: Left;   COLON SURGERY  2014   INSERTION OF DIALYSIS CATHETER N/A 09/19/2016   Procedure: INSERTION OF TUNNELED DIALYSIS CATHETER;  Surgeon: Conrad Milton, MD;  Location: [place];  Service: Vascular;  Laterality: N/A;   IR GENERIC HISTORICAL  09/14/2016   IR US GUIDE VASC ACCESS RIGHT 09/14/2016 [name], DO [facility]   IR GENERIC HISTORICAL  09/14/2016   IR FLUORO GUIDE CV LINE RIGHT 09/14/2016 [name], DO [facility]   IR PARACENTESIS  06/22/2021   LAPAROTOMY N/A 05/23/2021   Procedure: EXPLORATORY LAPAROTOMY LYSIS ADHESIONS;  Surgeon: [name], MD;  Location: [place];  Service: General;  Laterality: N/A;   ORIF TIBIA PLATEAU Left 01/15/2018   Procedure: OPEN REDUCTION INTERNAL FIXATION (ORIF) TIBIAL PLATEAU;  Surgeon: Altamese Norwood Court, MD;  Location: [place];  Service: Orthopedics;  Laterality: Left;   TRANSTHORACIC ECHOCARDIOGRAM  07/2016    EF 60-65%, No RWMA. Mod Concentric LVH - Gr 2 DD. Severe LA dilation. PAP ~35 mmHg (mild Pulm HTN)  --> no changes noted 1 month later   Family History:   Family History  Problem Relation Age of  Onset   Heart failure Mother        Died at age 32.   Heart attack Mother 48   Hypertension Mother    Diabetes Mellitus II Mother    Other Father        Unknown   Kidney failure Sister        (Oldest Sister)   Other Other        Multiple siblings have started her heart disease, he is not sure of the details.   CAD Nephew    Social History:  reports that he has been smoking cigarettes. He has been smoking an average of .25 packs per day. He has never used smokeless tobacco. He reports current drug use. Frequency: 1.00 time per week. Drugs: Marijuana and Heroin. He reports that he does not drink alcohol. Allergies   Allergen Reactions   No Known Allergies    Prior to Admission medications   Medication Sig Start Date End Date Taking? Authorizing Provider  diltiazem (CARDIZEM CD) 360 MG 24 hr capsule Take 360 mg by mouth daily. 06/19/17  Yes [provider]  sevelamer carbonate (RENVELA) 800 MG tablet Take 2 tablets by mouth with breakfast, with lunch, and with evening meal. 05/12/21  Yes [provider]   Current Facility-Administered Medications  Medication Dose Route Frequency Provider Last Rate Last Admin   acetaminophen (TYLENOL) tablet 650 mg  650 mg Oral Q6H PRN Karmen Bongo, MD       Or   acetaminophen (TYLENOL) suppository 650 mg  650 mg Rectal Q6H PRN Karmen Bongo, MD       albuterol (PROVENTIL) (2.5 MG/3ML) 0.083% nebulizer solution 2.5 mg  2.5 mg Nebulization Q2H PRN Karmen Bongo, MD       calcium carbonate (dosed in mg elemental calcium) suspension 500 mg of elemental calcium  500 mg of elemental calcium Oral Q6H PRN Karmen Bongo, MD       camphor-menthol [facility]) lotion 1 application  1 application Topical R6V PRN Karmen Bongo, MD       And   hydrOXYzine (ATARAX) tablet 25 mg  25 mg Oral Q8H PRN Karmen Bongo, MD       Derrill Memo ON 10/06/2021] Chlorhexidine Gluconate Cloth 2 % PADS 6 each  6 each Topical Q0600 Donato Heinz, MD       docusate sodium (ENEMEEZ) enema 283 mg  1 enema Rectal PRN Karmen Bongo, MD       feeding supplement (NEPRO CARB STEADY) liquid 237 mL  237 mL Oral TID PRN Karmen Bongo, MD       heparin injection 5,000 Units  5,000 Units Subcutaneous Q8H Karmen Bongo, MD       hydrALAZINE (APRESOLINE) injection 5 mg  5 mg Intravenous Q4H PRN Karmen Bongo, MD       ondansetron [facility]) tablet 4 mg  4 mg Oral Q6H PRN Karmen Bongo, MD       Or   ondansetron [facility]) injection 4 mg  4 mg Intravenous Q6H PRN Karmen Bongo, MD       sevelamer carbonate (RENVELA) tablet 1,600 mg  1,600 mg Oral TID with meals Karmen Bongo, MD        sodium chloride flush (NS) 0.9 % injection 3 mL  3 mL Intravenous Q12H Karmen Bongo, MD       sorbitol 70 % solution 30 mL  30 mL Oral PRN Karmen Bongo, MD       zolpidem (AMBIEN) tablet 5 mg  5 mg Oral QHS  PRN Karmen Bongo, MD       Current Outpatient Medications  Medication Sig Dispense Refill   diltiazem (CARDIZEM CD) 360 MG 24 hr capsule Take 360 mg by mouth daily.     sevelamer carbonate (RENVELA) 800 MG tablet Take 2 tablets by mouth with breakfast, with lunch, and with evening meal.     Labs: Basic Metabolic Panel: Recent Labs  Lab 10/05/21 0804  NA 133*  K 3.8  CL 93*  CO2 26  GLUCOSE 98  BUN 43*  CREATININE 5.64*  CALCIUM 8.5*   Liver Function Tests: No results for input(s): AST, ALT, ALKPHOS, BILITOT, PROT, ALBUMIN in the last 168 hours. No results for input(s): LIPASE, AMYLASE in the last 168 hours. No results for input(s): AMMONIA in the last 168 hours. CBC: Recent Labs  Lab 10/05/21 0804  WBC 9.1  HGB 10.8*  HCT 33.2*  MCV 82.8  PLT 257   Cardiac Enzymes: No results for input(s): CKTOTAL, CKMB, CKMBINDEX, TROPONINI in the last 168 hours. CBG: No results for input(s): GLUCAP in the last 168 hours. Iron Studies: No results for input(s): IRON, TIBC, TRANSFERRIN, FERRITIN in the last 72 hours. Studies/Results: DG Chest 2 View  Result Date: 10/05/2021 CLINICAL DATA:  Chest pain and dyspnea EXAM: CHEST - 2 VIEW COMPARISON:  10/01/2020 chest radiograph. FINDINGS: New mild enlargement of the cardiopericardial silhouette. Otherwise normal mediastinal contour. No pneumothorax. No pleural effusion. Mild diffuse prominence of the parahilar interstitial markings. Mild curvilinear right lung base scarring versus atelectasis. IMPRESSION: New mild enlargement of the cardiopericardial silhouette. Mild diffuse prominence of the parahilar interstitial markings. Findings could represent mild congestive heart failure or atypical infection. Electronically Signed   By:  Ilona Sorrel M.D.   On: 10/05/2021 09:05   CT Angio Chest PE W and/or Wo Contrast  Result Date: 10/05/2021 CLINICAL DATA:  Pulmonary embolism (PE) suspected, high prob. Dyspnea. EXAM: CT ANGIOGRAPHY CHEST WITH CONTRAST TECHNIQUE: Multidetector CT imaging of the chest was performed using the standard protocol during bolus administration of intravenous contrast. Multiplanar CT image reconstructions and MIPs were obtained to evaluate the vascular anatomy. CONTRAST:  46mL OMNIPAQUE IOHEXOL 350 MG/ML SOLN COMPARISON:  Chest radiograph from earlier today. 06/17/2021 chest CT. FINDINGS: Cardiovascular: The study is high quality for the evaluation of pulmonary embolism. There are no filling defects in the central, lobar, segmental or subsegmental pulmonary artery branches to suggest acute pulmonary embolism. Atherosclerotic thoracic aorta with dilated 4.3 cm ascending thoracic aorta, stable. Dilated main pulmonary artery (3.5 cm diameter), stable. Mild-to-moderate cardiomegaly, increased from prior CT. Small pericardial effusion/thickening, increased from prior. Left anterior descending and right coronary atherosclerosis. Mediastinum/Nodes: No discrete thyroid nodules. Unremarkable esophagus. No axillary adenopathy. No pathologically enlarged mediastinal nodes. Mildly prominent 1.3 cm right hilar node (series 5/image 69), new. No additional enlarged hilar nodes. Lungs/Pleura: No pneumothorax. No pleural effusion. Scattered mild platelike scarring versus atelectasis throughout both lungs. Mild diffuse interlobular septal thickening. Mild patchy ground-glass opacity throughout both lungs. Finely nodular patchy foci of peribronchovascular consolidation throughout both lungs, most prominent in basilar right upper lobe. No lung masses. Upper abdomen: Small volume upper abdominal ascites. Musculoskeletal: No aggressive appearing focal osseous lesions. Anasarca. Review of the MIP images confirms the above findings.  IMPRESSION: 1. No pulmonary embolism. 2. Mild-to-moderate cardiomegaly, increased from prior CT. Mild interlobular septal thickening, mild patchy ground-glass opacity and finely nodular patchy peribronchovascular consolidation throughout both lungs. Anasarca. Small volume upper abdominal ascites. This constellation of findings is most suggestive of congestive heart  failure with fluid overload. A component of atypical pneumonia is difficult to exclude by imaging, however is less favored. 3. Small pericardial effusion/thickening, increased from prior CT. 4. Dilated main pulmonary artery, stable, suggesting chronic pulmonary arterial hypertension. 5. Two-vessel coronary atherosclerosis. 6. Stable dilated 4.3 cm ascending thoracic aorta. Recommend annual imaging followup by CTA or MRA. This recommendation follows 2010 ACCF/AHA/AATS/ACR/ASA/SCA/SCAI/SIR/STS/SVM Guidelines for the Diagnosis and Management of Patients with Thoracic Aortic Disease. Circulation. 2010; 121: D532-D924. Aortic aneurysm NOS (ICD10-I71.9). 7. Mild right hilar adenopathy, nonspecific, probably reactive. 8. Aortic Atherosclerosis (ICD10-I70.0). Electronically Signed   By: Ilona Sorrel M.D.   On: 10/05/2021 12:29    ROS: Pertinent items are noted in HPI. Physical Exam: Vitals:   10/05/21 1130 10/05/21 1145 10/05/21 1337 10/05/21 1430  BP: (!) 150/103 (!) 158/118 (!) 156/83 (!) 152/104  Pulse: 79 83 80 86  Resp: (!) 27 18 20  (!) 22  Temp:      TempSrc:      SpO2: 95% 93% 92% 93%  Weight:      Height:          Weight change:   Intake/Output Summary (Last 24 hours) at 10/05/2021 1522 Last data filed at 10/05/2021 1408 Gross per 24 hour  Intake 312.78 ml  Output --  Net 312.78 ml   BP (!) 152/104    Pulse 86    Temp 98.1 F (36.7 C) (Oral)    Resp (!) 22    Ht 6\' 1"  (1.854 m)    Wt 68 kg    SpO2 93%    BMI 19.79 kg/m  General appearance: alert, no distress, and threatening to leave AMA if he is not given a lunch tray Head:  Normocephalic, without obvious abnormality, atraumatic Resp: rhonchi bilaterally and wheezes bilaterally Cardio: regular rate and rhythm and friction rub heard around precordium, harsh GI: soft, non-tender; bowel sounds normal; no masses,  no organomegaly Extremities: edema 2+ ankle edema L>R 1+ pretibial bilaterally and LUE AVF +T/B Dialysis Access:  Dialysis Orders: Center:  [facility]  on TTS . EDW 64.5 kg HD Bath 2K/2Ca  Time 3:45 Heparin none. Access LUE AVF BFR 450 DFR A1.5 Profile 4    Hectoral 1 mcg IV/HD Micera 150 mcg IVP every 2 weeks  Venofer  100 mg IV once per week   Assessment/Plan:  SOB/Congestive heart failure - has history of Grade 1 DD and normal EF by echo on 06/20/2221 but now with left and right heart failure with pericardial friction rub.  CT angio negative for PE.  Will plan for HD with UF today and again tomorrow as his BP allows.  Worrisome for tamponade given his h/o hypotension with HD.  Pericardial friction rub - harsh and present throughout systole and diastole.  ECHO tech at bedside and await results.  Concerning for tamponade.  CXR with new mild enlargement of cardiopericardial silhouette.  CT scan with small pericardial effusion/thickining increased from prior studies.  ESRD -  normally TTS but will plan for daily dialysis due to volume overload and symptomatic pulmonary edema  Hypertension/volume  - UF as tolerated  Anemia  - Hgb stable and no need for ESA at this time  Metabolic bone disease -  continue with home meds  Nutrition - renal diet, heart healthy  Donetta Potts, MD [place] Pager [phone] 10/05/2021, 3:22 PM

## 2021-10-05 NOTE — Assessment & Plan Note (Addendum)
-  Preserved EF on Echo in 06/2021, now with EF 40-45% -Will need volume control with HD -Cardiology consult requested

## 2021-10-05 NOTE — Assessment & Plan Note (Signed)
-  Patient acknowledged use of marijuana and tobacco when he can afford them -Denied use of other drugs -However, he was exceedingly somnolent during exam (he appeared to be close to falling into the floor when sitting up at the bedside) -UDS ordered, but the RN reported that he does not make any urine -RN then reported that drugs were found in his jacket pocket (marijuana and reportedly said he was using others but couldn't remember the names) -Suggest a low threshold for addition of CIWA/COWS

## 2021-10-05 NOTE — Assessment & Plan Note (Signed)
-  Stable, 4.3 cm -Needs outpatient vascular f/u but not large enough for repair currently

## 2021-10-05 NOTE — Assessment & Plan Note (Signed)
-  Patient on chronic TTS HD -Nephrology prn order set utilized -Despite full HD yesterday, he appears to be significantly volume overloaded Nephrology consulted and notified that patient will need HD

## 2021-10-05 NOTE — Progress Notes (Signed)
Pt admitted to rm 22 from ED. Initiated tele. CHG wipe given. VSS. Call bell within reach.   Lavenia Atlas, RN

## 2021-10-05 NOTE — H&P (Signed)
History and Physical    Timothy [name] ZOX:[id number] DOB: 21-Jan-1961 DOA: 10/05/2021  PCP: [name], MD Consultants:  Ellyn Hack - cardiology; nephrology Patient coming from:  Home - lives with wife and daughter; NOK: Wife, Timothy [name], [phone]   Chief Complaint: SOB  HPI: Timothy [name] is a 60 y.o. male with medical history significant of BPH; colon cancer; ESRD on HD; HTN; and polysubstance abuse presenting with SOB. He reports that he has some much fluid in his legs he can't put his shoes on.  He has been SOB.  He had HD yesterday without improvement.  He seemed to be ok when he got home and he ate ice and felt bloated.  He got a breathing treatment here, not sure if that helped.  No fever.  He has coughing all week.  His weight varies from 155-170.  He has had difficulty completing HD due to hypotension.  He denied somnolence but dropped off to sleep throughout evaluation while sitting at the bedside, seemingly at risk for falling in the floor.  He also complained of hunger.    ED Course: Very SOB, HD patient and dialyzed yesterday.  Wheezing, coarse on arrival.  Given steroids, neb with improvement.  Treated for PNA but actually looks like fluid overload.  Likely needs HD again.  Nephrology consulted.  Markedly elevated procalcitonin.  Review of Systems: As per HPI; otherwise review of systems reviewed and negative.   Ambulatory Status:  Ambulates without assistance or with a cane  COVID Vaccine Status:  Complete  Past Medical History:  Diagnosis Date   Anemia of chronic kidney failure    BPH (benign prostatic hyperplasia)    Colon cancer ([place]) 2014   End stage renal disease on dialysis ([place]) 2017   Hypertension    Hypertensive heart disease with chronic diastolic congestive heart failure ([place]) 07/17/2016   Polysubstance abuse ([place])    History of heroin and marijuana use    Past Surgical History:  Procedure Laterality Date   ABDOMINAL SURGERY     AV  FISTULA PLACEMENT Left 09/19/2016   Procedure: Left arm Radiocephalic ARTERIOVENOUS (AV) FISTULA CREATION;  Surgeon: Conrad Arnold, MD;  Location: [place];  Service: Vascular;  Laterality: Left;   [place] Left 07/09/2017   Procedure: BRACHIOCEPHALIC FISTULA CREATION;  Surgeon: Conrad Talmo, MD;  Location: [place];  Service: Vascular;  Laterality: Left;   COLON SURGERY  2014   INSERTION OF DIALYSIS CATHETER N/A 09/19/2016   Procedure: INSERTION OF TUNNELED DIALYSIS CATHETER;  Surgeon: Conrad , MD;  Location: [place];  Service: Vascular;  Laterality: N/A;   IR GENERIC HISTORICAL  09/14/2016   IR US GUIDE VASC ACCESS RIGHT 09/14/2016 [name], DO [facility]   IR GENERIC HISTORICAL  09/14/2016   IR FLUORO GUIDE CV LINE RIGHT 09/14/2016 [name], DO [facility]   IR PARACENTESIS  06/22/2021   LAPAROTOMY N/A 05/23/2021   Procedure: EXPLORATORY LAPAROTOMY LYSIS ADHESIONS;  Surgeon: [name], MD;  Location: [place];  Service: General;  Laterality: N/A;   ORIF TIBIA PLATEAU Left 01/15/2018   Procedure: OPEN REDUCTION INTERNAL FIXATION (ORIF) TIBIAL PLATEAU;  Surgeon: Altamese Francisville, MD;  Location: [place];  Service: Orthopedics;  Laterality: Left;   TRANSTHORACIC ECHOCARDIOGRAM  07/2016    EF 60-65%, No RWMA. Mod Concentric LVH - Gr 2 DD. Severe LA dilation. PAP ~35 mmHg (mild Pulm HTN)  --> no changes noted 1 month later    Social History   Socioeconomic  History   Marital status: Married    Spouse name: Not on file   Number of children: Not on file   Years of education: Not on file   Highest education level: Not on file  Occupational History   Occupation: disabled  Tobacco Use   Smoking status: Some Days    Packs/day: 0.25    Types: Cigarettes   Smokeless tobacco: Never  Vaping Use   Vaping Use: Never used  Substance and Sexual Activity   Alcohol use: No    Alcohol/week: 7.0 standard drinks    Types: 7 Cans of beer per week   Drug use: Yes    Frequency:  1.0 times per week    Types: Marijuana, Heroin    Comment: reports quitting heroin 15 years ago; can't afford daily marijuana   Sexual activity: Not on file    Comment: once a week  Other Topics Concern   Not on file  Social History Narrative   Not on file   Social Determinants of Health   Financial Resource Strain: Not on file  Food Insecurity: Not on file  Transportation Needs: Not on file  Physical Activity: Not on file  Stress: Not on file  Social Connections: Not on file  Intimate Partner Violence: Not on file    Allergies  Allergen Reactions   No Known Allergies     Family History  Problem Relation Age of Onset   Heart failure Mother        Died at age 25.   Heart attack Mother 13   Hypertension Mother    Diabetes Mellitus II Mother    Other Father        Unknown   Kidney failure Sister        (Oldest Sister)   Other Other        Multiple siblings have started her heart disease, he is not sure of the details.   CAD Nephew     Prior to Admission medications   Medication Sig Start Date End Date Taking? Authorizing Provider  diltiazem (CARDIZEM CD) 360 MG 24 hr capsule Take 360 mg by mouth daily. 06/19/17  Yes [provider]  sevelamer carbonate (RENVELA) 800 MG tablet Take 2 tablets by mouth with breakfast, with lunch, and with evening meal. 05/12/21  Yes [provider]  buPROPion (WELLBUTRIN XL) 150 MG 24 hr tablet Take 1 tablet (150 mg total) by mouth daily. Patient not taking: Reported on 10/05/2021 06/26/21   Wayland Denis, MD  hydrALAZINE (APRESOLINE) 25 MG tablet Take 1 tablet (25 mg total) by mouth 3 (three) times daily. Patient not taking: Reported on 10/05/2021 06/25/21   Wayland Denis, MD  lidocaine-prilocaine (EMLA) cream Apply 1 application topically See admin instructions. Tuesday,thursday,Saturday prior to dialysis Patient not taking: Reported on 10/05/2021 05/12/21   [provider]  polyethylene glycol (MIRALAX / GLYCOLAX)  17 g packet Take 17 g by mouth daily as needed. Patient not taking: Reported on 10/05/2021 06/25/21   Wayland Denis, MD  senna (SENOKOT) 8.6 MG TABS tablet Take 1 tablet (8.6 mg total) by mouth daily as needed for mild constipation. Patient not taking: Reported on 10/05/2021 06/25/21   Wayland Denis, MD    Physical Exam: Vitals:   10/05/21 1337 10/05/21 1430 10/05/21 1515 10/05/21 1600  BP: (!) 156/83 (!) 152/104 (!) 158/105 (!) 155/115  Pulse: 80 86 80 85  Resp: 20 (!) 22 19 (!) 24  Temp:      TempSrc:  SpO2: 92% 93% 93% 94%  Weight:      Height:         General:  Appears calm and comfortable and is in NAD, somnolent, lapsing off to sleep periodically with concern for fall since he was sitting at the bedside Eyes:  PERRL, EOMI, normal lids, iris ENT:  grossly normal hearing, lips & tongue, mmm Neck:  no LAD, masses or thyromegaly; +JVD Cardiovascular:  RRR, with very loud apparent friction rub. 3+ LE edema.  Respiratory:   Scattered wheezes with bibasilar crackles.  Normal to mildly increased respiratory effort. Abdomen:  taut with distention presumably from volume overload Skin:  no rash or induration seen on limited exam Musculoskeletal:  grossly normal tone BUE/BLE, good ROM, no bony abnormality Psychiatric:  grossly normal but somnolent mood and affect (?narcolepsy), speech fluent and appropriate, AOx3 Neurologic:  CN 2-12 grossly intact, moves all extremities in coordinated fashion    Radiological Exams on Admission: Independently reviewed - see discussion in A/P where applicable  DG Chest 2 View  Result Date: 10/05/2021 CLINICAL DATA:  Chest pain and dyspnea EXAM: CHEST - 2 VIEW COMPARISON:  10/01/2020 chest radiograph. FINDINGS: New mild enlargement of the cardiopericardial silhouette. Otherwise normal mediastinal contour. No pneumothorax. No pleural effusion. Mild diffuse prominence of the parahilar interstitial markings. Mild curvilinear right lung base scarring  versus atelectasis. IMPRESSION: New mild enlargement of the cardiopericardial silhouette. Mild diffuse prominence of the parahilar interstitial markings. Findings could represent mild congestive heart failure or atypical infection. Electronically Signed   By: Ilona Sorrel M.D.   On: 10/05/2021 09:05   CT Angio Chest PE W and/or Wo Contrast  Result Date: 10/05/2021 CLINICAL DATA:  Pulmonary embolism (PE) suspected, high prob. Dyspnea. EXAM: CT ANGIOGRAPHY CHEST WITH CONTRAST TECHNIQUE: Multidetector CT imaging of the chest was performed using the standard protocol during bolus administration of intravenous contrast. Multiplanar CT image reconstructions and MIPs were obtained to evaluate the vascular anatomy. CONTRAST:  42mL OMNIPAQUE IOHEXOL 350 MG/ML SOLN COMPARISON:  Chest radiograph from earlier today. 06/17/2021 chest CT. FINDINGS: Cardiovascular: The study is high quality for the evaluation of pulmonary embolism. There are no filling defects in the central, lobar, segmental or subsegmental pulmonary artery branches to suggest acute pulmonary embolism. Atherosclerotic thoracic aorta with dilated 4.3 cm ascending thoracic aorta, stable. Dilated main pulmonary artery (3.5 cm diameter), stable. Mild-to-moderate cardiomegaly, increased from prior CT. Small pericardial effusion/thickening, increased from prior. Left anterior descending and right coronary atherosclerosis. Mediastinum/Nodes: No discrete thyroid nodules. Unremarkable esophagus. No axillary adenopathy. No pathologically enlarged mediastinal nodes. Mildly prominent 1.3 cm right hilar node (series 5/image 69), new. No additional enlarged hilar nodes. Lungs/Pleura: No pneumothorax. No pleural effusion. Scattered mild platelike scarring versus atelectasis throughout both lungs. Mild diffuse interlobular septal thickening. Mild patchy ground-glass opacity throughout both lungs. Finely nodular patchy foci of peribronchovascular consolidation throughout  both lungs, most prominent in basilar right upper lobe. No lung masses. Upper abdomen: Small volume upper abdominal ascites. Musculoskeletal: No aggressive appearing focal osseous lesions. Anasarca. Review of the MIP images confirms the above findings. IMPRESSION: 1. No pulmonary embolism. 2. Mild-to-moderate cardiomegaly, increased from prior CT. Mild interlobular septal thickening, mild patchy ground-glass opacity and finely nodular patchy peribronchovascular consolidation throughout both lungs. Anasarca. Small volume upper abdominal ascites. This constellation of findings is most suggestive of congestive heart failure with fluid overload. A component of atypical pneumonia is difficult to exclude by imaging, however is less favored. 3. Small pericardial effusion/thickening, increased from prior CT. 4.  Dilated main pulmonary artery, stable, suggesting chronic pulmonary arterial hypertension. 5. Two-vessel coronary atherosclerosis. 6. Stable dilated 4.3 cm ascending thoracic aorta. Recommend annual imaging followup by CTA or MRA. This recommendation follows 2010 ACCF/AHA/AATS/ACR/ASA/SCA/SCAI/SIR/STS/SVM Guidelines for the Diagnosis and Management of Patients with Thoracic Aortic Disease. Circulation. 2010; 121: O973-Z329. Aortic aneurysm NOS (ICD10-I71.9). 7. Mild right hilar adenopathy, nonspecific, probably reactive. 8. Aortic Atherosclerosis (ICD10-I70.0). Electronically Signed   By: Ilona Sorrel M.D.   On: 10/05/2021 12:29   ECHOCARDIOGRAM LIMITED  Result Date: 10/05/2021    ECHOCARDIOGRAM LIMITED REPORT   Patient Name:   Timothy [name] Date of Exam: 10/05/2021 Medical Rec #:  [id number]       Height:       73.0 in Accession #:    [id number]      Weight:       150.0 lb Date of Birth:  [birth year]       BSA:          1.904 m Patient Age:    52 years        BP:           152/104 mmHg Patient Gender: M               HR:           79 bpm. Exam Location:  Inpatient Procedure: Limited Echo, Color Doppler and  Cardiac Doppler Indications:    R01.1 Murmur  History:        Patient has prior history of Echocardiogram examinations, most                 recent 06/20/2021. Risk Factors:Hypertension and Polysubstance                 Abuse.  Sonographer:    Raquel Sarna Senior RDCS Referring Phys: [place]  1. Left ventricular ejection fraction, by estimation, is 40 to 45%. The left ventricle has mildly decreased function. The left ventricle demonstrates global hypokinesis. There is moderate concentric left ventricular hypertrophy. Left ventricular diastolic function could not be evaluated.  2. Right ventricular systolic function is low normal. The right ventricular size is moderately enlarged. Moderately increased right ventricular wall thickness. There is moderately elevated pulmonary artery systolic pressure.  3. A small pericardial effusion is present. The pericardial effusion is circumferential. There is no evidence of cardiac tamponade.  4. Moderate mitral valve regurgitation.  5. Tricuspid valve regurgitation is moderate to severe.  6. The aortic valve is tricuspid. There is mild calcification of the aortic valve. There is moderate thickening of the aortic valve. Aortic valve regurgitation is mild.  7. The inferior vena cava is dilated in size with <50% respiratory variability, suggesting right atrial pressure of 15 mmHg. Comparison(s): A prior study was performed on 06/20/2021. The left ventricular function is worsened. The right ventricle is now dilated. There is marked worsening of the mitral insufficiency and the tricuspid insufficiency. There is a new small pericardial effusion. There is evidence of hypervolemia/ increased right atrial pressure FINDINGS  Left Ventricle: Left ventricular ejection fraction, by estimation, is 40 to 45%. The left ventricle has mildly decreased function. The left ventricle demonstrates global hypokinesis. The left ventricular internal cavity size was normal in size. There is   moderate concentric left ventricular hypertrophy. Left ventricular diastolic function could not be evaluated. Right Ventricle: The right ventricular size is moderately enlarged. Moderately increased right ventricular wall thickness. Right ventricular systolic function is low normal. There is moderately elevated pulmonary artery  systolic pressure. The tricuspid regurgitant velocity is 3.40 m/s, and with an assumed right atrial pressure of 15 mmHg, the estimated right ventricular systolic pressure is 97.5 mmHg. Pericardium: A small pericardial effusion is present. The pericardial effusion is circumferential. There is no evidence of cardiac tamponade. Mitral Valve: Moderate mitral valve regurgitation, with centrally-directed jet. Tricuspid Valve: Tricuspid valve regurgitation is moderate to severe. Aortic Valve: The aortic valve is tricuspid. There is mild calcification of the aortic valve. There is moderate thickening of the aortic valve. Aortic valve regurgitation is mild. Pulmonic Valve: The pulmonic valve was normal in structure. Pulmonic valve regurgitation is trivial. Venous: The inferior vena cava is dilated in size with less than 50% respiratory variability, suggesting right atrial pressure of 15 mmHg. RIGHT VENTRICLE TAPSE (M-mode): 1.5 cm AORTIC VALVE LVOT Vmax:   168.00 cm/s LVOT Vmean:  106.000 cm/s LVOT VTI:    0.251 m TRICUSPID VALVE TR Peak grad:   46.2 mmHg TR Vmax:        340.00 cm/s  SHUNTS Systemic VTI: 0.25 m Sanda Klein MD Electronically signed by Sanda Klein MD Signature Date/Time: 10/05/2021/3:42:54 PM    Final     EKG: Independently reviewed.  NSR with rate 85; nonspecific ST changes with concern for ischemia   Labs on Admission: I have personally reviewed the available labs and imaging studies at the time of the admission.  Pertinent labs:   BUN 43/Creatinine 5.64/GFR 11 BNP >4500 HS troponin 167, 156 Procalcitonin 97.44 COVID/flu negative   Assessment/Plan Principal  Problem:   Hypervolemia associated with renal insufficiency Active Problems:   Pericardial effusion without cardiac tamponade   Acute systolic CHF (congestive heart failure) (HCC)   Polysubstance abuse (HCC)   Elevated procalcitonin   HTN (hypertension)   ESRD on hemodialysis (HCC)   Aneurysm of thoracic aorta     * Hypervolemia associated with renal insufficiency -Suspect this is less related to heart failure and more related to insufficient fluid removed at HD - likely associated with hypotension during sessions -Since he is dialysis-dependent, he was unable to clear the excess fluid -He is currently significantly volume overloaded despite having routine HD yesterday -He is likely to benefit from serial HD  -Given loud friction rub auscultated at time of admission, stat echo was ordered  Acute systolic CHF (congestive heart failure) ([place]) -Preserved EF on Echo in 06/2021, now with EF 40-45% -Will need volume control with HD -Cardiology consult requested  Pericardial effusion without cardiac tamponade -STAT limited echo performed and showed small effusion (despite significant rub on exam) -No evidence of tamponade -This appears to be less likely related to hypotension during HD sessions  Elevated procalcitonin -Patient does not have significant symptoms suggestive of infection -Imaging without clear evidence of infection -For now will monitor without antibiotics -This may be elevated due to inflammation, ?myocarditis/pericarditis - not mentioned on echo but he does have acute changes compared to prior so this is a consideration  Polysubstance abuse ([place]) -Patient acknowledged use of marijuana and tobacco when he can afford them -Denied use of other drugs -However, he was exceedingly somnolent during exam (he appeared to be close to falling into the floor when sitting up at the bedside) -UDS ordered, but the RN reported that he does not make any urine -RN then reported that  drugs were found in his jacket pocket (marijuana and reportedly said he was using others but couldn't remember the names) -Suggest a low threshold for addition of CIWA/COWS   Aneurysm of thoracic  aorta -Stable, 4.3 cm -Needs outpatient vascular f/u but not large enough for repair currently  ESRD on hemodialysis ([place]) -Patient on chronic TTS HD -Nephrology prn order set utilized -Despite full HD yesterday, he appears to be significantly volume overloaded Nephrology consulted and notified that patient will need HD  HTN (hypertension) -Hold Cardizem for now -prn IV hydralazine     Note: This patient has been tested and is negative for the novel coronavirus COVID-19. The patient has been fully vaccinated against COVID-19.   Level of care: Progressive DVT prophylaxis: Heparin Code Status:  Full - confirmed with patient Family Communication: None present; he did not want me to speak with his wife at the time of admission. Disposition Plan:  The patient is from: home  Anticipated d/c is to: home without [facility] services   Anticipated d/c date will depend on clinical response to treatment, likely 2-3 days  Patient is currently: acutely ill Consults called: Nephrology; Cardiology; PT/OT; [facility] team  Admission status:  Admit - It is my clinical opinion that admission to INPATIENT is reasonable and necessary because of the expectation that this patient will require hospital care that crosses at least 2 midnights to treat this condition based on the medical complexity of the problems presented.  Given the aforementioned information, the predictability of an adverse outcome is felt to be significant.    Karmen Bongo MD [facility]   How to contact the [facility] Attending or Consulting provider [place] or covering provider during after hours [place], for this patient?  Check the care team in [facility] and look for a) attending/consulting TRH provider listed and b) the [facility] team listed Log into  [email or web address] and use [place] universal password to access. If you do not have the password, please contact the hospital operator. Locate the [facility] provider you are looking for under [facility] and page to a number that you can be directly reached. If you still have difficulty reaching the provider, please page the [facility] (Director on Call) for the Hospitalists listed on amion for assistance.   10/05/2021, 5:13 PM

## 2021-10-06 ENCOUNTER — Other Ambulatory Visit: Payer: Self-pay

## 2021-10-06 DIAGNOSIS — E8770 Fluid overload, unspecified: Secondary | ICD-10-CM

## 2021-10-06 DIAGNOSIS — J189 Pneumonia, unspecified organism: Secondary | ICD-10-CM

## 2021-10-06 DIAGNOSIS — G9341 Metabolic encephalopathy: Secondary | ICD-10-CM

## 2021-10-06 DIAGNOSIS — N289 Disorder of kidney and ureter, unspecified: Secondary | ICD-10-CM

## 2021-10-06 LAB — BASIC METABOLIC PANEL
Anion gap: 15 (ref 5–15)
BUN: 47 mg/dL — ABNORMAL HIGH (ref 6–20)
CO2: 26 mmol/L (ref 22–32)
Calcium: 8.9 mg/dL (ref 8.9–10.3)
Chloride: 93 mmol/L — ABNORMAL LOW (ref 98–111)
Creatinine, Ser: 5.57 mg/dL — ABNORMAL HIGH (ref 0.61–1.24)
GFR, Estimated: 11 mL/min — ABNORMAL LOW (ref >60)
Glucose, Bld: 225 mg/dL — ABNORMAL HIGH (ref 70–99)
Potassium: 3.8 mmol/L (ref 3.5–5.1)
Sodium: 134 mmol/L — ABNORMAL LOW (ref 135–145)

## 2021-10-06 LAB — CBC
HCT: 30.8 % — ABNORMAL LOW (ref 39.0–52.0)
Hemoglobin: 10.1 g/dL — ABNORMAL LOW (ref 13.0–17.0)
MCH: 26.9 pg (ref 26.0–34.0)
MCHC: 32.8 g/dL (ref 30.0–36.0)
MCV: 81.9 fL (ref 80.0–100.0)
Platelets: 274 10*3/uL (ref 150–400)
RBC: 3.76 MIL/uL — ABNORMAL LOW (ref 4.22–5.81)
RDW: 19.6 % — ABNORMAL HIGH (ref 11.5–15.5)
WBC: 14.8 10*3/uL — ABNORMAL HIGH (ref 4.0–10.5)
nRBC: 0 % (ref 0.0–0.2)

## 2021-10-06 LAB — PROCALCITONIN: Procalcitonin: 0.41 ng/mL

## 2021-10-06 MED ORDER — DILTIAZEM HCL ER COATED BEADS 360 MG PO CP24
360.0000 mg | ORAL_CAPSULE | Freq: Every day | ORAL | Status: DC
  Administered 2021-10-06: 19:00:00 360 mg via ORAL
  Filled 2021-10-06 (×2): qty 1

## 2021-10-06 MED ORDER — METHYLPREDNISOLONE SODIUM SUCC 125 MG IJ SOLR
40.0000 mg | Freq: Two times a day (BID) | INTRAMUSCULAR | Status: DC
  Administered 2021-10-06 – 2021-10-07 (×2): 40 mg via INTRAVENOUS
  Filled 2021-10-06 (×2): qty 2

## 2021-10-06 MED ORDER — PIPERACILLIN-TAZOBACTAM IN DEX 2-0.25 GM/50ML IV SOLN
2.2500 g | Freq: Three times a day (TID) | INTRAVENOUS | Status: DC
  Administered 2021-10-06 – 2021-10-07 (×3): 2.25 g via INTRAVENOUS
  Filled 2021-10-06 (×4): qty 50

## 2021-10-06 NOTE — Progress Notes (Signed)
Pt receives out-pt HD at [facility] on TTS. Pt arrives at 5:40 for 6:00 chair time. Will assist as needed.  [name] Renal Navigator [phone]

## 2021-10-06 NOTE — Progress Notes (Signed)
Patient ID: Timothy [name], male   DOB: May 11, 1961, 60 y.o.   MRN: [id number] S: Remains SOB this morning.  Was only able to UF 1.5 liters with HD because he signed off the machine early for unclear reasons. O:BP (!) 195/110 (BP Location: Right Arm)    Pulse 95    Temp (!) 97.5 F (36.4 C) (Oral)    Resp 20    Ht 6\' 1"  (1.854 m)    Wt 69.8 kg    SpO2 94%    BMI 20.30 kg/m   Intake/Output Summary (Last 24 hours) at 10/06/2021 1026 Last data filed at 10/06/2021 0215 Gross per 24 hour  Intake 672.78 ml  Output 1584 ml  Net -911.22 ml   Intake/Output: I/O last 3 completed shifts: In: 672.8 [P.O.:360; IV Piggyback:312.8] Out: 1584 [Urine:1; Other:1583]  Intake/Output this shift:  No intake/output data recorded. Weight change:  TZG:YFVC resp distress CVS:RRR, + friction rub Resp: scattered rhonchi and wheezes bilaterally Abd:+BS, soft, NT/Nd Ext: improved pedal edema, LUE AVF +T/B  Recent Labs  Lab 10/05/21 0804 10/06/21 0930  NA 133* 134*  K 3.8 3.8  CL 93* 93*  CO2 26 26  GLUCOSE 98 225*  BUN 43* 47*  CREATININE 5.64* 5.57*  CALCIUM 8.5* 8.9   Liver Function Tests: No results for input(s): AST, ALT, ALKPHOS, BILITOT, PROT, ALBUMIN in the last 168 hours. No results for input(s): LIPASE, AMYLASE in the last 168 hours. No results for input(s): AMMONIA in the last 168 hours. CBC: Recent Labs  Lab 10/05/21 0804 10/05/21 2021 10/06/21 0930  WBC 9.1 9.0 14.8*  HGB 10.8* 10.8* 10.1*  HCT 33.2* 33.3* 30.8*  MCV 82.8 81.8 81.9  PLT 257 290 274   Cardiac Enzymes: No results for input(s): CKTOTAL, CKMB, CKMBINDEX, TROPONINI in the last 168 hours. CBG: No results for input(s): GLUCAP in the last 168 hours.  Iron Studies: No results for input(s): IRON, TIBC, TRANSFERRIN, FERRITIN in the last 72 hours. Studies/Results: DG Chest 2 View  Result Date: 10/05/2021 CLINICAL DATA:  Chest pain and dyspnea EXAM: CHEST - 2 VIEW COMPARISON:  10/01/2020 chest radiograph. FINDINGS:  New mild enlargement of the cardiopericardial silhouette. Otherwise normal mediastinal contour. No pneumothorax. No pleural effusion. Mild diffuse prominence of the parahilar interstitial markings. Mild curvilinear right lung base scarring versus atelectasis. IMPRESSION: New mild enlargement of the cardiopericardial silhouette. Mild diffuse prominence of the parahilar interstitial markings. Findings could represent mild congestive heart failure or atypical infection. Electronically Signed   By: Ilona Sorrel M.D.   On: 10/05/2021 09:05   CT Angio Chest PE W and/or Wo Contrast  Result Date: 10/05/2021 CLINICAL DATA:  Pulmonary embolism (PE) suspected, high prob. Dyspnea. EXAM: CT ANGIOGRAPHY CHEST WITH CONTRAST TECHNIQUE: Multidetector CT imaging of the chest was performed using the standard protocol during bolus administration of intravenous contrast. Multiplanar CT image reconstructions and MIPs were obtained to evaluate the vascular anatomy. CONTRAST:  45mL OMNIPAQUE IOHEXOL 350 MG/ML SOLN COMPARISON:  Chest radiograph from earlier today. 06/17/2021 chest CT. FINDINGS: Cardiovascular: The study is high quality for the evaluation of pulmonary embolism. There are no filling defects in the central, lobar, segmental or subsegmental pulmonary artery branches to suggest acute pulmonary embolism. Atherosclerotic thoracic aorta with dilated 4.3 cm ascending thoracic aorta, stable. Dilated main pulmonary artery (3.5 cm diameter), stable. Mild-to-moderate cardiomegaly, increased from prior CT. Small pericardial effusion/thickening, increased from prior. Left anterior descending and right coronary atherosclerosis. Mediastinum/Nodes: No discrete thyroid nodules. Unremarkable esophagus. No axillary adenopathy. No  pathologically enlarged mediastinal nodes. Mildly prominent 1.3 cm right hilar node (series 5/image 69), new. No additional enlarged hilar nodes. Lungs/Pleura: No pneumothorax. No pleural effusion. Scattered mild  platelike scarring versus atelectasis throughout both lungs. Mild diffuse interlobular septal thickening. Mild patchy ground-glass opacity throughout both lungs. Finely nodular patchy foci of peribronchovascular consolidation throughout both lungs, most prominent in basilar right upper lobe. No lung masses. Upper abdomen: Small volume upper abdominal ascites. Musculoskeletal: No aggressive appearing focal osseous lesions. Anasarca. Review of the MIP images confirms the above findings. IMPRESSION: 1. No pulmonary embolism. 2. Mild-to-moderate cardiomegaly, increased from prior CT. Mild interlobular septal thickening, mild patchy ground-glass opacity and finely nodular patchy peribronchovascular consolidation throughout both lungs. Anasarca. Small volume upper abdominal ascites. This constellation of findings is most suggestive of congestive heart failure with fluid overload. A component of atypical pneumonia is difficult to exclude by imaging, however is less favored. 3. Small pericardial effusion/thickening, increased from prior CT. 4. Dilated main pulmonary artery, stable, suggesting chronic pulmonary arterial hypertension. 5. Two-vessel coronary atherosclerosis. 6. Stable dilated 4.3 cm ascending thoracic aorta. Recommend annual imaging followup by CTA or MRA. This recommendation follows 2010 ACCF/AHA/AATS/ACR/ASA/SCA/SCAI/SIR/STS/SVM Guidelines for the Diagnosis and Management of Patients with Thoracic Aortic Disease. Circulation. 2010; 121: J188-C166. Aortic aneurysm NOS (ICD10-I71.9). 7. Mild right hilar adenopathy, nonspecific, probably reactive. 8. Aortic Atherosclerosis (ICD10-I70.0). Electronically Signed   By: Ilona Sorrel M.D.   On: 10/05/2021 12:29   ECHOCARDIOGRAM LIMITED  Result Date: 10/05/2021    ECHOCARDIOGRAM LIMITED REPORT   Patient Name:   Timothy OROURKE Date of Exam: 10/05/2021 Medical Rec #:  [id number]       Height:       73.0 in Accession #:    [id number]      Weight:       150.0 lb Date  of Birth:  [birth year]       BSA:          1.904 m Patient Age:    60 years        BP:           152/104 mmHg Patient Gender: M               HR:           79 bpm. Exam Location:  Inpatient Procedure: Limited Echo, Color Doppler and Cardiac Doppler Indications:    R01.1 Murmur  History:        Patient has prior history of Echocardiogram examinations, most                 recent 06/20/2021. Risk Factors:Hypertension and Polysubstance                 Abuse.  Sonographer:    Raquel Sarna Senior RDCS Referring Phys: [place]  1. Left ventricular ejection fraction, by estimation, is 40 to 45%. The left ventricle has mildly decreased function. The left ventricle demonstrates global hypokinesis. There is moderate concentric left ventricular hypertrophy. Left ventricular diastolic function could not be evaluated.  2. Right ventricular systolic function is low normal. The right ventricular size is moderately enlarged. Moderately increased right ventricular wall thickness. There is moderately elevated pulmonary artery systolic pressure.  3. A small pericardial effusion is present. The pericardial effusion is circumferential. There is no evidence of cardiac tamponade.  4. Moderate mitral valve regurgitation.  5. Tricuspid valve regurgitation is moderate to severe.  6. The aortic valve is tricuspid. There is mild calcification of the aortic  valve. There is moderate thickening of the aortic valve. Aortic valve regurgitation is mild.  7. The inferior vena cava is dilated in size with <50% respiratory variability, suggesting right atrial pressure of 15 mmHg. Comparison(s): A prior study was performed on 06/20/2021. The left ventricular function is worsened. The right ventricle is now dilated. There is marked worsening of the mitral insufficiency and the tricuspid insufficiency. There is a new small pericardial effusion. There is evidence of hypervolemia/ increased right atrial pressure FINDINGS  Left Ventricle: Left  ventricular ejection fraction, by estimation, is 40 to 45%. The left ventricle has mildly decreased function. The left ventricle demonstrates global hypokinesis. The left ventricular internal cavity size was normal in size. There is  moderate concentric left ventricular hypertrophy. Left ventricular diastolic function could not be evaluated. Right Ventricle: The right ventricular size is moderately enlarged. Moderately increased right ventricular wall thickness. Right ventricular systolic function is low normal. There is moderately elevated pulmonary artery systolic pressure. The tricuspid regurgitant velocity is 3.40 m/s, and with an assumed right atrial pressure of 15 mmHg, the estimated right ventricular systolic pressure is 41.7 mmHg. Pericardium: A small pericardial effusion is present. The pericardial effusion is circumferential. There is no evidence of cardiac tamponade. Mitral Valve: Moderate mitral valve regurgitation, with centrally-directed jet. Tricuspid Valve: Tricuspid valve regurgitation is moderate to severe. Aortic Valve: The aortic valve is tricuspid. There is mild calcification of the aortic valve. There is moderate thickening of the aortic valve. Aortic valve regurgitation is mild. Pulmonic Valve: The pulmonic valve was normal in structure. Pulmonic valve regurgitation is trivial. Venous: The inferior vena cava is dilated in size with less than 50% respiratory variability, suggesting right atrial pressure of 15 mmHg. RIGHT VENTRICLE TAPSE (M-mode): 1.5 cm AORTIC VALVE LVOT Vmax:   168.00 cm/s LVOT Vmean:  106.000 cm/s LVOT VTI:    0.251 m TRICUSPID VALVE TR Peak grad:   46.2 mmHg TR Vmax:        340.00 cm/s  SHUNTS Systemic VTI: 0.25 m Dani Gobble Croitoru MD Electronically signed by Sanda Klein MD Signature Date/Time: 10/05/2021/3:42:54 PM    Final     Chlorhexidine Gluconate Cloth  6 each Topical Q0600   heparin  5,000 Units Subcutaneous Q8H   sevelamer carbonate  1,600 mg Oral TID with meals    sodium chloride flush  3 mL Intravenous Q12H    BMET    Component Value Date/Time   NA 134 (L) 10/06/2021 0930   K 3.8 10/06/2021 0930   CL 93 (L) 10/06/2021 0930   CO2 26 10/06/2021 0930   GLUCOSE 225 (H) 10/06/2021 0930   BUN 47 (H) 10/06/2021 0930   CREATININE 5.57 (H) 10/06/2021 0930   CREATININE 1.59 (H) 07/29/2015 0943   CALCIUM 8.9 10/06/2021 0930   CALCIUM 8.4 (L) 09/14/2016 1615   GFRNONAA 11 (L) 10/06/2021 0930   GFRNONAA 48 (L) 07/29/2015 0943   GFRAA 7 (L) 05/18/2019 2220   GFRAA 56 (L) 07/29/2015 0943   CBC    Component Value Date/Time   WBC 14.8 (H) 10/06/2021 0930   RBC 3.76 (L) 10/06/2021 0930   HGB 10.1 (L) 10/06/2021 0930   HCT 30.8 (L) 10/06/2021 0930   PLT 274 10/06/2021 0930   MCV 81.9 10/06/2021 0930   MCH 26.9 10/06/2021 0930   MCHC 32.8 10/06/2021 0930   RDW 19.6 (H) 10/06/2021 0930   LYMPHSABS 0.4 (L) 06/14/2021 0134   MONOABS 0.9 06/14/2021 0134   EOSABS 0.0 06/14/2021 0134   BASOSABS  0.0 06/14/2021 0134    Dialysis Orders: Center:  [facility]  on TTS . EDW 64.5 kg HD Bath 2K/2Ca  Time 3:45 Heparin none. Access LUE AVF BFR 450 DFR A1.5 Profile 4    Hectoral 1 mcg IV/HD Micera 150 mcg IVP every 2 weeks  Venofer  100 mg IV once per week    Assessment/Plan:  SOB/Congestive heart failure - has history of Grade 1 DD and normal EF by echo on 06/20/2221 but now with left and right heart failure with pericardial friction rub.  CT angio negative for PE.   Underwent HD 10/05/21 evening but s/o early with UF of 1.5 liters Will plan for HD with UF today and again tomorrow as his BP allows but will not continue with daily dialysis if he signs off early again.   ECHO negative for tamponade New reduced LV EF by ECHO, Cardiology following.  Pericardial friction rub - harsh and present throughout systole and diastole.  ECHO with small amount of pericardial effusion.  Concerning for tamponade.  CXR with new mild enlargement of cardiopericardial silhouette.  CT  scan with small pericardial effusion/thickining increased from prior studies.  ESRD -  normally TTS but will plan for daily dialysis due to volume overload and symptomatic pulmonary edema  Hypertension/volume  - UF as tolerated  Anemia  - Hgb stable and no need for ESA at this time  Metabolic bone disease -  continue with home meds  Nutrition - renal diet, heart healthy  Donetta Potts, MD [facility] [phone]

## 2021-10-06 NOTE — Progress Notes (Signed)
Progress Note  Patient Name: Timothy [name] Date of Encounter: 10/06/2021  [facility] Cardiologist: Dr Ellyn Hack  Subjective   Dyspneic; no CP  Inpatient Medications    Scheduled Meds:  Chlorhexidine Gluconate Cloth  6 each Topical Q0600   heparin  5,000 Units Subcutaneous Q8H   sevelamer carbonate  1,600 mg Oral TID with meals   sodium chloride flush  3 mL Intravenous Q12H   Continuous Infusions:  sodium chloride     sodium chloride     PRN Meds: sodium chloride, sodium chloride, acetaminophen **OR** acetaminophen, albuterol, alteplase, calcium carbonate (dosed in mg elemental calcium), camphor-menthol **AND** hydrOXYzine, docusate sodium, feeding supplement (NEPRO CARB STEADY), heparin, hydrALAZINE, lidocaine (PF), lidocaine-prilocaine, ondansetron **OR** ondansetron (ZOFRAN) IV, pentafluoroprop-tetrafluoroeth, sorbitol, zolpidem   Vital Signs    Vitals:   10/06/21 0130 10/06/21 0200 10/06/21 0215 10/06/21 0356  BP: (!) 176/116 (!) 162/107 (!) 171/114 (!) 177/106  Pulse:    89  Resp: (!) 22 20 20 20   Temp:  97.7 F (36.5 C) 97.7 F (36.5 C) (!) 97.4 F (36.3 C)  TempSrc:  Axillary  Oral  SpO2: 96% 96% 96% 95%  Weight:  69.8 kg    Height:        Intake/Output Summary (Last 24 hours) at 10/06/2021 0750 Last data filed at 10/06/2021 0215 Gross per 24 hour  Intake 672.78 ml  Output 1584 ml  Net -911.22 ml   Last 3 Weights 10/06/2021 10/06/2021 10/05/2021  Weight (lbs) 153 lb 14.1 oz 156 lb 15.5 oz 156 lb 1.4 oz  Weight (kg) 69.8 kg 71.2 kg 70.8 kg      Telemetry    Sinus with PVCs and rare ventricular escape beat; no prolonged pauses- Personally Reviewed  Physical Exam   GEN: No acute distress.   Neck: Positive JVD Cardiac: RRR, loud rub Respiratory: Diffuse rhonchi GI: Soft, nontender, non-distended  MS: trace edema Neuro:  Nonfocal  Psych: Normal affect   Labs    High Sensitivity Troponin:   Recent Labs  Lab 10/05/21 0804  10/05/21 1100  TROPONINIHS 167* 156*     Chemistry Recent Labs  Lab 10/05/21 0804  NA 133*  K 3.8  CL 93*  CO2 26  GLUCOSE 98  BUN 43*  CREATININE 5.64*  CALCIUM 8.5*  GFRNONAA 11*  ANIONGAP 14     Hematology Recent Labs  Lab 10/05/21 0804 10/05/21 2021  WBC 9.1 9.0  RBC 4.01* 4.07*  HGB 10.8* 10.8*  HCT 33.2* 33.3*  MCV 82.8 81.8  MCH 26.9 26.5  MCHC 32.5 32.4  RDW 19.8* 19.6*  PLT 257 290    BNP Recent Labs  Lab 10/05/21 0804  BNP >4,500.0*      Radiology    DG Chest 2 View  Result Date: 10/05/2021 CLINICAL DATA:  Chest pain and dyspnea EXAM: CHEST - 2 VIEW COMPARISON:  10/01/2020 chest radiograph. FINDINGS: New mild enlargement of the cardiopericardial silhouette. Otherwise normal mediastinal contour. No pneumothorax. No pleural effusion. Mild diffuse prominence of the parahilar interstitial markings. Mild curvilinear right lung base scarring versus atelectasis. IMPRESSION: New mild enlargement of the cardiopericardial silhouette. Mild diffuse prominence of the parahilar interstitial markings. Findings could represent mild congestive heart failure or atypical infection. Electronically Signed   By: Ilona Sorrel M.D.   On: 10/05/2021 09:05   CT Angio Chest PE W and/or Wo Contrast  Result Date: 10/05/2021 CLINICAL DATA:  Pulmonary embolism (PE) suspected, high prob. Dyspnea. EXAM: CT ANGIOGRAPHY CHEST WITH CONTRAST TECHNIQUE: Multidetector  CT imaging of the chest was performed using the standard protocol during bolus administration of intravenous contrast. Multiplanar CT image reconstructions and MIPs were obtained to evaluate the vascular anatomy. CONTRAST:  32mL OMNIPAQUE IOHEXOL 350 MG/ML SOLN COMPARISON:  Chest radiograph from earlier today. 06/17/2021 chest CT. FINDINGS: Cardiovascular: The study is high quality for the evaluation of pulmonary embolism. There are no filling defects in the central, lobar, segmental or subsegmental pulmonary artery branches to  suggest acute pulmonary embolism. Atherosclerotic thoracic aorta with dilated 4.3 cm ascending thoracic aorta, stable. Dilated main pulmonary artery (3.5 cm diameter), stable. Mild-to-moderate cardiomegaly, increased from prior CT. Small pericardial effusion/thickening, increased from prior. Left anterior descending and right coronary atherosclerosis. Mediastinum/Nodes: No discrete thyroid nodules. Unremarkable esophagus. No axillary adenopathy. No pathologically enlarged mediastinal nodes. Mildly prominent 1.3 cm right hilar node (series 5/image 69), new. No additional enlarged hilar nodes. Lungs/Pleura: No pneumothorax. No pleural effusion. Scattered mild platelike scarring versus atelectasis throughout both lungs. Mild diffuse interlobular septal thickening. Mild patchy ground-glass opacity throughout both lungs. Finely nodular patchy foci of peribronchovascular consolidation throughout both lungs, most prominent in basilar right upper lobe. No lung masses. Upper abdomen: Small volume upper abdominal ascites. Musculoskeletal: No aggressive appearing focal osseous lesions. Anasarca. Review of the MIP images confirms the above findings. IMPRESSION: 1. No pulmonary embolism. 2. Mild-to-moderate cardiomegaly, increased from prior CT. Mild interlobular septal thickening, mild patchy ground-glass opacity and finely nodular patchy peribronchovascular consolidation throughout both lungs. Anasarca. Small volume upper abdominal ascites. This constellation of findings is most suggestive of congestive heart failure with fluid overload. A component of atypical pneumonia is difficult to exclude by imaging, however is less favored. 3. Small pericardial effusion/thickening, increased from prior CT. 4. Dilated main pulmonary artery, stable, suggesting chronic pulmonary arterial hypertension. 5. Two-vessel coronary atherosclerosis. 6. Stable dilated 4.3 cm ascending thoracic aorta. Recommend annual imaging followup by CTA or MRA.  This recommendation follows 2010 ACCF/AHA/AATS/ACR/ASA/SCA/SCAI/SIR/STS/SVM Guidelines for the Diagnosis and Management of Patients with Thoracic Aortic Disease. Circulation. 2010; 121: W803-O122. Aortic aneurysm NOS (ICD10-I71.9). 7. Mild right hilar adenopathy, nonspecific, probably reactive. 8. Aortic Atherosclerosis (ICD10-I70.0). Electronically Signed   By: Ilona Sorrel M.D.   On: 10/05/2021 12:29   ECHOCARDIOGRAM LIMITED  Result Date: 10/05/2021    ECHOCARDIOGRAM LIMITED REPORT   Patient Name:   CLIFFARD HAIR Date of Exam: 10/05/2021 Medical Rec #:  [id number]       Height:       73.0 in Accession #:    [id number]      Weight:       150.0 lb Date of Birth:  Dec 27, 1960       BSA:          1.904 m Patient Age:    69 years        BP:           152/104 mmHg Patient Gender: M               HR:           79 bpm. Exam Location:  Inpatient Procedure: Limited Echo, Color Doppler and Cardiac Doppler Indications:    R01.1 Murmur  History:        Patient has prior history of Echocardiogram examinations, most                 recent 06/20/2021. Risk Factors:Hypertension and Polysubstance                 Abuse.  Sonographer:  Raquel Sarna Senior RDCS Referring Phys: [place]  1. Left ventricular ejection fraction, by estimation, is 40 to 45%. The left ventricle has mildly decreased function. The left ventricle demonstrates global hypokinesis. There is moderate concentric left ventricular hypertrophy. Left ventricular diastolic function could not be evaluated.  2. Right ventricular systolic function is low normal. The right ventricular size is moderately enlarged. Moderately increased right ventricular wall thickness. There is moderately elevated pulmonary artery systolic pressure.  3. A small pericardial effusion is present. The pericardial effusion is circumferential. There is no evidence of cardiac tamponade.  4. Moderate mitral valve regurgitation.  5. Tricuspid valve regurgitation is moderate to  severe.  6. The aortic valve is tricuspid. There is mild calcification of the aortic valve. There is moderate thickening of the aortic valve. Aortic valve regurgitation is mild.  7. The inferior vena cava is dilated in size with <50% respiratory variability, suggesting right atrial pressure of 15 mmHg. Comparison(s): A prior study was performed on 06/20/2021. The left ventricular function is worsened. The right ventricle is now dilated. There is marked worsening of the mitral insufficiency and the tricuspid insufficiency. There is a new small pericardial effusion. There is evidence of hypervolemia/ increased right atrial pressure FINDINGS  Left Ventricle: Left ventricular ejection fraction, by estimation, is 40 to 45%. The left ventricle has mildly decreased function. The left ventricle demonstrates global hypokinesis. The left ventricular internal cavity size was normal in size. There is  moderate concentric left ventricular hypertrophy. Left ventricular diastolic function could not be evaluated. Right Ventricle: The right ventricular size is moderately enlarged. Moderately increased right ventricular wall thickness. Right ventricular systolic function is low normal. There is moderately elevated pulmonary artery systolic pressure. The tricuspid regurgitant velocity is 3.40 m/s, and with an assumed right atrial pressure of 15 mmHg, the estimated right ventricular systolic pressure is 91.4 mmHg. Pericardium: A small pericardial effusion is present. The pericardial effusion is circumferential. There is no evidence of cardiac tamponade. Mitral Valve: Moderate mitral valve regurgitation, with centrally-directed jet. Tricuspid Valve: Tricuspid valve regurgitation is moderate to severe. Aortic Valve: The aortic valve is tricuspid. There is mild calcification of the aortic valve. There is moderate thickening of the aortic valve. Aortic valve regurgitation is mild. Pulmonic Valve: The pulmonic valve was normal in structure.  Pulmonic valve regurgitation is trivial. Venous: The inferior vena cava is dilated in size with less than 50% respiratory variability, suggesting right atrial pressure of 15 mmHg. RIGHT VENTRICLE TAPSE (M-mode): 1.5 cm AORTIC VALVE LVOT Vmax:   168.00 cm/s LVOT Vmean:  106.000 cm/s LVOT VTI:    0.251 m TRICUSPID VALVE TR Peak grad:   46.2 mmHg TR Vmax:        340.00 cm/s  SHUNTS Systemic VTI: 0.25 m Mihai Croitoru MD Electronically signed by Sanda Klein MD Signature Date/Time: 10/05/2021/3:42:54 PM    Final      Patient Profile     60 y.o. male with past medical history of end-stage renal disease dialysis dependent, hypertension, colon cancer, benign prostatic hypertrophy, substance abuse admitted with acute on chronic combined systolic/diastolic congestive heart failure.  Assessment & Plan    Acute on chronic combined systolic/diastolic congestive heart failure-patient remains dyspneic this morning and volume overloaded on examination.  As outlined previously he does not make significant amounts of urine.  He is scheduled for dialysis later today and will likely need dry weight decreased.  Nephrology following.   Cardiomyopathy-LV function noted to be newly reduced on recent echocardiogram.  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  He is scheduled for dialysis today and has had difficulties with hypotension on dialysis previously.  We will follow blood pressure and add medications (ARB/beta-blocker) as tolerated.  Plan cardiac CTA to exclude coronary disease once CHF improves.   Pericardial fusion-loud pericardial friction rub on exam.  However echocardiogram shows small pericardial effusion.  No evidence of tamponade. 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  Worse compared to previous.  We will likely plan repeat  echocardiogram in 3 months once his CHF improves. End-stage renal disease-dialysis per nephrology. Hypertension-blood pressure elevated.  If remains elevated despite dialysis we will add ARB and beta-blocker. Possible URI-patient is complaining of cough productive of yellow sputum.  Management per primary care. Thoracic aortic aneurysm-he will need follow-up CT in 1 year.  For questions or updates, please contact [place] Please consult [email or web address] for contact info under        Signed, Kirk Ruths, MD  10/06/2021, 7:50 AM

## 2021-10-06 NOTE — Evaluation (Signed)
Occupational Therapy Evaluation Patient Details Name: Timothy [name] MRN: [id number] DOB: Oct 24, 1960 Today's Date: 10/06/2021   History of Present Illness Timothy [name] is a 60 y.o. male presenting with SOB, swelling in BLEs, fluid overload. PMH: ESRD with HD on TTS, hx of colon cancer, CHF, polysubstance abuse; recent hospitalization for ex lap 8-06/2021.   Clinical Impression   Pt PTA: reports independence with ADL and mobility; recently a decline since 06/2021 and ex lap surgery "I've been going downhill physically." Pt lives with spouse and daughter at home- reports spouse works outside of the home. Pt currently, supervision to minguardA overall for mobility with ADL using RW for stability. Pt supervisionA overall for ADL and requires increased time for all tasks due to dypnea. O2 sats remained >95% on RA throughout exertion. Pt would benefit from continued OT skilled services. OT following acutely.     Recommendations for follow up therapy are one component of a multi-disciplinary discharge planning process, led by the attending physician.  Recommendations may be updated based on patient status, additional functional criteria and insurance authorization.   Follow Up Recommendations  Home health OT    Assistance Recommended at Discharge Set up Supervision/Assistance  Functional Status Assessment  Patient has had a recent decline in their functional status and demonstrates the ability to make significant improvements in function in a reasonable and predictable amount of time.  Equipment Recommendations  BSC/3in1    Recommendations for Other Services       Precautions / Restrictions Precautions Precautions: Fall Precaution Comments: scissored gait Restrictions Weight Bearing Restrictions: No      Mobility Bed Mobility               General bed mobility comments: OOB pre and post session    Transfers Overall transfer level: Modified independent Equipment used:  Rolling walker (2 wheels)                      Balance Overall balance assessment: Mild deficits observed, not formally tested                                         ADL either performed or assessed with clinical judgement   ADL Overall ADL's : Needs assistance/impaired Eating/Feeding: Modified independent   Grooming: Supervision/safety;Standing;Sitting   Upper Body Bathing: Supervision/ safety;Sitting;Standing   Lower Body Bathing: Supervison/ safety;Sitting/lateral leans;Sit to/from stand;Cueing for safety   Upper Body Dressing : Supervision/safety;Sitting;Standing   Lower Body Dressing: Supervision/safety;Sitting/lateral leans;Sit to/from stand;Cueing for safety   Toilet Transfer: Supervision/safety;Cueing for safety;Cueing for sequencing   Toileting- Clothing Manipulation and Hygiene: Supervision/safety;Sitting/lateral lean;Sit to/from stand       Functional mobility during ADLs: Supervision/safety;Rolling walker (2 wheels);Min guard (Pt supervision to minguardA overall for mobility with ADL using RW for stability ambulating 40' before turning around as gait pattern gets sloppy he starts to scissor and knees near buckle.) General ADL Comments: Pt supervision to minguardA overall for mobility with ADL using RW for stability. Pt supervisionA overall for ADL and requires increased time for all tasks due to dypnea. O2 sats remained >95% on RA throughout exertion.     Vision Baseline Vision/History: 0 No visual deficits Ability to See in Adequate Light: 0 Adequate Patient Visual Report: No change from baseline Vision Assessment?: No apparent visual deficits     Perception     Praxis      Pertinent  Vitals/Pain Pain Assessment: 0-10 Pain Score: 5  Pain Location: low back Pain Descriptors / Indicators: Discomfort;Sore Pain Intervention(s): Monitored during session;Repositioned     Hand Dominance Right   Extremity/Trunk Assessment Upper  Extremity Assessment Upper Extremity Assessment: Generalized weakness   Lower Extremity Assessment Lower Extremity Assessment: Generalized weakness;RLE deficits/detail;LLE deficits/detail RLE Deficits / Details: decreased quad strength for step height LLE Deficits / Details: decreased quad strength for step height   Cervical / Trunk Assessment Cervical / Trunk Assessment: Normal   Communication Communication Communication: No difficulties   Cognition Arousal/Alertness: Awake/alert Behavior During Therapy: WFL for tasks assessed/performed Overall Cognitive Status: Within Functional Limits for tasks assessed                                 General Comments: A/O x4; increased time to talk due to dyspnea     General Comments  O2 >95% on RA and Hr 96 BPM    Exercises     Shoulder Instructions      Home Living Family/patient expects to be discharged to:: Private residence Living Arrangements: Spouse/significant other;Children Available Help at Discharge: Family;Available PRN/intermittently Type of Home: Apartment Home Access: Stairs to enter Entrance Stairs-Number of Steps: at least 2 flights Entrance Stairs-Rails: Left Home Layout: One level     Bathroom Shower/Tub: Teacher, early years/pre: Standard Bathroom Accessibility: Yes   Home Equipment: None          Prior Functioning/Environment Prior Level of Function : Independent/Modified Independent                        OT Problem List: Decreased strength;Decreased activity tolerance;Impaired balance (sitting and/or standing);Decreased cognition;Decreased safety awareness;Pain;Increased edema;Cardiopulmonary status limiting activity      OT Treatment/Interventions:      OT Goals(Current goals can be found in the care plan section) Acute Rehab OT Goals Patient Stated Goal: to get my breath back OT Goal Formulation: With patient Time For Goal Achievement: 10/20/21 Potential to  Achieve Goals: Good ADL Goals Additional ADL Goal #1: Pt will increase to x8 mins of OOB ADL tasks with <2 seated rest breaks with O2 >90% Additional ADL Goal #2: Pt will state 3 energy conservation strategies for OOB ADL and mobility.  OT Frequency:     Barriers to D/C:            Co-evaluation              AM-PAC OT "6 Clicks" Daily Activity     Outcome Measure Help from another person eating meals?: None Help from another person taking care of personal grooming?: None Help from another person toileting, which includes using toliet, bedpan, or urinal?: A Little Help from another person bathing (including washing, rinsing, drying)?: A Little Help from another person to put on and taking off regular upper body clothing?: None Help from another person to put on and taking off regular lower body clothing?: None 6 Click Score: 22   End of Session Equipment Utilized During Treatment: Rolling walker (2 wheels) Nurse Communication: Mobility status  Activity Tolerance: Patient tolerated treatment well Patient left: in chair;with call bell/phone within reach  OT Visit Diagnosis: Unsteadiness on feet (R26.81);Muscle weakness (generalized) (M62.81)                Time: 7035-0093 OT Time Calculation (min): 22 min Charges:  OT General Charges $OT Visit: 1 Visit OT  Evaluation $OT Eval Moderate Complexity: 1 Mod  Jefferey Pica, OTR/L Acute Rehabilitation Services Pager: [phone] Office: [phone]   Osias Resnick C 10/06/2021, 1:00 PM

## 2021-10-06 NOTE — Progress Notes (Signed)
PROGRESS NOTE    Timothy [name]  GUY:[id number] DOB: [birth year] DOA: 10/05/2021 PCP: [name], MD   Chief complaint.  Shortness of breath. Brief Narrative:  Timothy [name] is a 60 y.o. male with medical history significant of BPH; colon cancer; ESRD on HD; HTN; and polysubstance abuse presenting with SOB.  He had a difficulty completing HD due to hypotension.   CT scan of the chest showed cardiomegaly, patchy infiltrates and consolidation.  He also has significant elevation of procalcitonin level.  He is dialyzed on 12/22.   Assessment & Plan:   Principal Problem:   Hypervolemia associated with renal insufficiency Active Problems:   HTN (hypertension)   ESRD on hemodialysis (HCC)   Polysubstance abuse (HCC)   Pericardial effusion without cardiac tamponade   Acute systolic CHF (congestive heart failure) (HCC)   Elevated procalcitonin   Aneurysm of thoracic aorta  Acute on chronic systolic congestive heart failure. End-stage renal disease with hypervolemia. Patient echocardiogram was 40 to 45% on 06/2021.  He is frankly volume overloaded.  Currently receiving hemodialysis. Appreciate cardiology consult.  Pericardial effusion without cardiac tamponade. Elevated troponin likely due to acute congestive heart failure. Patient has been evaluated by cardiology, no evidence of cardiac tamponade.  Likely multifocal pneumonia. Acute metabolic encephalopathy. Polydrug abuse. Possible aspiration pneumonia.  Patient had a profound procalcitonin level which has quickly dropped down this morning.  He may had aspiration pneumonia given his drug abuse history. Obtain serum drug screen.  Start antibiotics with Zosyn. Patient is very sleepy today, will obtain ABG to rule out CO2 retention. Patient also had a significant bronchospasm with wheezes, will add scheduled Solu-Medrol 40 mg twice a day. Patient appears very sick, will follow closely.  Essential hypertension. Aneurysm of  thoracic aorta. Restarted diltiazem.  follow-up with PCP for aortic aneurysm    DVT prophylaxis: Heparin Code Status: full Family Communication:  Disposition Plan:    Status is: Inpatient  Remains inpatient appropriate because: Severity of disease, IV antibiotics        I/O last 3 completed shifts: In: 672.8 [P.O.:360; IV Piggyback:312.8] Out: 1584 [Urine:1; Other:1583] Total I/O In: 200 [P.O.:200] Out: -      Consultants:  Nephrology and cardiology  Procedures: HD  Antimicrobials: Zosyn  Subjective: Seen patient at dialysis.  Patient is confused and sleepy.  He has significant short of breath while talking to me.  He has a cough, with yellow mucus. No fever or chills. No abdominal pain or nausea vomiting. He is not making urine.  Objective: Vitals:   10/06/21 1240 10/06/21 1300 10/06/21 1330 10/06/21 1400  BP: (!) 176/114 (!) 180/129 (!) 172/115 (!) 175/110  Pulse: 89 (!) 103 95 96  Resp: 12   (!) 27  Temp: 97.8 F (36.6 C)     TempSrc: Temporal     SpO2: 95%     Weight: 70.5 kg     Height:        Intake/Output Summary (Last 24 hours) at 10/06/2021 1408 Last data filed at 10/06/2021 1145 Gross per 24 hour  Intake 560 ml  Output 1584 ml  Net -1024 ml   Filed Weights   10/06/21 0000 10/06/21 0200 10/06/21 1240  Weight: 71.2 kg 69.8 kg 70.5 kg    Examination:  General exam: Drowsy, acutely ill-appearing. Respiratory system: Diffuse wheezing. Respiratory effort normal. Cardiovascular system: S1 & S2 heard, RRR. No JVD, murmurs, rubs, gallops or clicks.  Gastrointestinal system: Abdomen is nondistended, soft and nontender. No organomegaly or masses felt. Normal  bowel sounds heard. Central nervous system: Drowsy and disoriented to time. No focal neurological deficits. Extremities: 2+ leg edema Skin: No rashes, lesions or ulcers Psychiatry: Mood & affect appropriate.     Data Reviewed: I have personally reviewed following labs and imaging  studies  CBC: Recent Labs  Lab 10/05/21 0804 10/05/21 2021 10/06/21 0930  WBC 9.1 9.0 14.8*  HGB 10.8* 10.8* 10.1*  HCT 33.2* 33.3* 30.8*  MCV 82.8 81.8 81.9  PLT 257 290 124   Basic Metabolic Panel: Recent Labs  Lab 10/05/21 0804 10/06/21 0930  NA 133* 134*  K 3.8 3.8  CL 93* 93*  CO2 26 26  GLUCOSE 98 225*  BUN 43* 47*  CREATININE 5.64* 5.57*  CALCIUM 8.5* 8.9   GFR: Estimated Creatinine Clearance: 14.1 mL/min (A) (by C-G formula based on SCr of 5.57 mg/dL (H)). Liver Function Tests: No results for input(s): AST, ALT, ALKPHOS, BILITOT, PROT, ALBUMIN in the last 168 hours. No results for input(s): LIPASE, AMYLASE in the last 168 hours. No results for input(s): AMMONIA in the last 168 hours. Coagulation Profile: No results for input(s): INR, PROTIME in the last 168 hours. Cardiac Enzymes: No results for input(s): CKTOTAL, CKMB, CKMBINDEX, TROPONINI in the last 168 hours. BNP (last 3 results) No results for input(s): PROBNP in the last 8760 hours. HbA1C: No results for input(s): HGBA1C in the last 72 hours. CBG: No results for input(s): GLUCAP in the last 168 hours. Lipid Profile: No results for input(s): CHOL, HDL, LDLCALC, TRIG, CHOLHDL, LDLDIRECT in the last 72 hours. Thyroid Function Tests: No results for input(s): TSH, T4TOTAL, FREET4, T3FREE, THYROIDAB in the last 72 hours. Anemia Panel: No results for input(s): VITAMINB12, FOLATE, FERRITIN, TIBC, IRON, RETICCTPCT in the last 72 hours. Sepsis Labs: Recent Labs  Lab 10/05/21 1021 10/06/21 0930  PROCALCITON 97.44 0.41    Recent Results (from the past 240 hour(s))  Resp Panel by RT-PCR (Flu A&B, Covid) Nasopharyngeal Swab     Status: None   Collection Time: 10/05/21  8:02 AM   Specimen: Nasopharyngeal Swab; Nasopharyngeal(NP) swabs in vial transport medium  Result Value Ref Range Status   SARS Coronavirus 2 by RT PCR NEGATIVE NEGATIVE Final    Comment: (NOTE) SARS-CoV-2 target nucleic acids are NOT  DETECTED.  The SARS-CoV-2 RNA is generally detectable in upper respiratory specimens during the acute phase of infection. The lowest concentration of SARS-CoV-2 viral copies this assay can detect is 138 copies/mL. A negative result does not preclude SARS-Cov-2 infection and should not be used as the sole basis for treatment or other patient management decisions. A negative result may occur with  improper specimen collection/handling, submission of specimen other than nasopharyngeal swab, presence of viral mutation(s) within the areas targeted by this assay, and inadequate number of viral copies(<138 copies/mL). A negative result must be combined with clinical observations, patient history, and epidemiological information. The expected result is Negative.  Fact Sheet for Patients:  EntrepreneurPulse.com.au  Fact Sheet for Healthcare Providers:  IncredibleEmployment.be  This test is no t yet approved or cleared by the [place] FDA and  has been authorized for detection and/or diagnosis of SARS-CoV-2 by FDA under an Emergency Use Authorization (EUA). This EUA will remain  in effect (meaning this test can be used) for the duration of the COVID-19 declaration under Section 564(b)(1) of the Act, 21 U.S.C.section 360bbb-3(b)(1), unless the authorization is terminated  or revoked sooner.       Influenza A by PCR NEGATIVE NEGATIVE Final  Influenza B by PCR NEGATIVE NEGATIVE Final    Comment: (NOTE) The Xpert Xpress SARS-CoV-2/FLU/RSV plus assay is intended as an aid in the diagnosis of influenza from Nasopharyngeal swab specimens and should not be used as a sole basis for treatment. Nasal washings and aspirates are unacceptable for Xpert Xpress SARS-CoV-2/FLU/RSV testing.  Fact Sheet for Patients: EntrepreneurPulse.com.au  Fact Sheet for Healthcare Providers: IncredibleEmployment.be  This test is not yet  approved or cleared by the [place] FDA and has been authorized for detection and/or diagnosis of SARS-CoV-2 by FDA under an Emergency Use Authorization (EUA). This EUA will remain in effect (meaning this test can be used) for the duration of the COVID-19 declaration under Section 564(b)(1) of the Act, 21 U.S.C. section 360bbb-3(b)(1), unless the authorization is terminated or revoked.  Performed at [facility], [place] [address] [place], [place] [address]          Radiology Studies: DG Chest 2 View  Result Date: 10/05/2021 CLINICAL DATA:  Chest pain and dyspnea EXAM: CHEST - 2 VIEW COMPARISON:  10/01/2020 chest radiograph. FINDINGS: New mild enlargement of the cardiopericardial silhouette. Otherwise normal mediastinal contour. No pneumothorax. No pleural effusion. Mild diffuse prominence of the parahilar interstitial markings. Mild curvilinear right lung base scarring versus atelectasis. IMPRESSION: New mild enlargement of the cardiopericardial silhouette. Mild diffuse prominence of the parahilar interstitial markings. Findings could represent mild congestive heart failure or atypical infection. Electronically Signed   By: Ilona Sorrel M.D.   On: 10/05/2021 09:05   CT Angio Chest PE W and/or Wo Contrast  Result Date: 10/05/2021 CLINICAL DATA:  Pulmonary embolism (PE) suspected, high prob. Dyspnea. EXAM: CT ANGIOGRAPHY CHEST WITH CONTRAST TECHNIQUE: Multidetector CT imaging of the chest was performed using the standard protocol during bolus administration of intravenous contrast. Multiplanar CT image reconstructions and MIPs were obtained to evaluate the vascular anatomy. CONTRAST:  77mL OMNIPAQUE IOHEXOL 350 MG/ML SOLN COMPARISON:  Chest radiograph from earlier today. 06/17/2021 chest CT. FINDINGS: Cardiovascular: The study is high quality for the evaluation of pulmonary embolism. There are no filling defects in the central, lobar, segmental or subsegmental pulmonary artery  branches to suggest acute pulmonary embolism. Atherosclerotic thoracic aorta with dilated 4.3 cm ascending thoracic aorta, stable. Dilated main pulmonary artery (3.5 cm diameter), stable. Mild-to-moderate cardiomegaly, increased from prior CT. Small pericardial effusion/thickening, increased from prior. Left anterior descending and right coronary atherosclerosis. Mediastinum/Nodes: No discrete thyroid nodules. Unremarkable esophagus. No axillary adenopathy. No pathologically enlarged mediastinal nodes. Mildly prominent 1.3 cm right hilar node (series 5/image 69), new. No additional enlarged hilar nodes. Lungs/Pleura: No pneumothorax. No pleural effusion. Scattered mild platelike scarring versus atelectasis throughout both lungs. Mild diffuse interlobular septal thickening. Mild patchy ground-glass opacity throughout both lungs. Finely nodular patchy foci of peribronchovascular consolidation throughout both lungs, most prominent in basilar right upper lobe. No lung masses. Upper abdomen: Small volume upper abdominal ascites. Musculoskeletal: No aggressive appearing focal osseous lesions. Anasarca. Review of the MIP images confirms the above findings. IMPRESSION: 1. No pulmonary embolism. 2. Mild-to-moderate cardiomegaly, increased from prior CT. Mild interlobular septal thickening, mild patchy ground-glass opacity and finely nodular patchy peribronchovascular consolidation throughout both lungs. Anasarca. Small volume upper abdominal ascites. This constellation of findings is most suggestive of congestive heart failure with fluid overload. A component of atypical pneumonia is difficult to exclude by imaging, however is less favored. 3. Small pericardial effusion/thickening, increased from prior CT. 4. Dilated main pulmonary artery, stable, suggesting chronic pulmonary arterial hypertension. 5. Two-vessel coronary atherosclerosis.  6. Stable dilated 4.3 cm ascending thoracic aorta. Recommend annual imaging followup by  CTA or MRA. This recommendation follows 2010 ACCF/AHA/AATS/ACR/ASA/SCA/SCAI/SIR/STS/SVM Guidelines for the Diagnosis and Management of Patients with Thoracic Aortic Disease. Circulation. 2010; 121: P546-F681. Aortic aneurysm NOS (ICD10-I71.9). 7. Mild right hilar adenopathy, nonspecific, probably reactive. 8. Aortic Atherosclerosis (ICD10-I70.0). Electronically Signed   By: Ilona Sorrel M.D.   On: 10/05/2021 12:29   ECHOCARDIOGRAM LIMITED  Result Date: 10/05/2021    ECHOCARDIOGRAM LIMITED REPORT   Patient Name:   ANDRES VEST Date of Exam: 10/05/2021 Medical Rec #:  [id number]       Height:       73.0 in Accession #:    [id number]      Weight:       150.0 lb Date of Birth:  09-Feb-1961       BSA:          1.904 m Patient Age:    30 years        BP:           152/104 mmHg Patient Gender: M               HR:           79 bpm. Exam Location:  Inpatient Procedure: Limited Echo, Color Doppler and Cardiac Doppler Indications:    R01.1 Murmur  History:        Patient has prior history of Echocardiogram examinations, most                 recent 06/20/2021. Risk Factors:Hypertension and Polysubstance                 Abuse.  Sonographer:    Raquel Sarna Senior RDCS Referring Phys: [place]  1. Left ventricular ejection fraction, by estimation, is 40 to 45%. The left ventricle has mildly decreased function. The left ventricle demonstrates global hypokinesis. There is moderate concentric left ventricular hypertrophy. Left ventricular diastolic function could not be evaluated.  2. Right ventricular systolic function is low normal. The right ventricular size is moderately enlarged. Moderately increased right ventricular wall thickness. There is moderately elevated pulmonary artery systolic pressure.  3. A small pericardial effusion is present. The pericardial effusion is circumferential. There is no evidence of cardiac tamponade.  4. Moderate mitral valve regurgitation.  5. Tricuspid valve regurgitation is  moderate to severe.  6. The aortic valve is tricuspid. There is mild calcification of the aortic valve. There is moderate thickening of the aortic valve. Aortic valve regurgitation is mild.  7. The inferior vena cava is dilated in size with <50% respiratory variability, suggesting right atrial pressure of 15 mmHg. Comparison(s): A prior study was performed on 06/20/2021. The left ventricular function is worsened. The right ventricle is now dilated. There is marked worsening of the mitral insufficiency and the tricuspid insufficiency. There is a new small pericardial effusion. There is evidence of hypervolemia/ increased right atrial pressure FINDINGS  Left Ventricle: Left ventricular ejection fraction, by estimation, is 40 to 45%. The left ventricle has mildly decreased function. The left ventricle demonstrates global hypokinesis. The left ventricular internal cavity size was normal in size. There is  moderate concentric left ventricular hypertrophy. Left ventricular diastolic function could not be evaluated. Right Ventricle: The right ventricular size is moderately enlarged. Moderately increased right ventricular wall thickness. Right ventricular systolic function is low normal. There is moderately elevated pulmonary artery systolic pressure. The tricuspid regurgitant velocity is 3.40 m/s, and with an assumed right  atrial pressure of 15 mmHg, the estimated right ventricular systolic pressure is 70.0 mmHg. Pericardium: A small pericardial effusion is present. The pericardial effusion is circumferential. There is no evidence of cardiac tamponade. Mitral Valve: Moderate mitral valve regurgitation, with centrally-directed jet. Tricuspid Valve: Tricuspid valve regurgitation is moderate to severe. Aortic Valve: The aortic valve is tricuspid. There is mild calcification of the aortic valve. There is moderate thickening of the aortic valve. Aortic valve regurgitation is mild. Pulmonic Valve: The pulmonic valve was normal  in structure. Pulmonic valve regurgitation is trivial. Venous: The inferior vena cava is dilated in size with less than 50% respiratory variability, suggesting right atrial pressure of 15 mmHg. RIGHT VENTRICLE TAPSE (M-mode): 1.5 cm AORTIC VALVE LVOT Vmax:   168.00 cm/s LVOT Vmean:  106.000 cm/s LVOT VTI:    0.251 m TRICUSPID VALVE TR Peak grad:   46.2 mmHg TR Vmax:        340.00 cm/s  SHUNTS Systemic VTI: 0.25 m Dani Gobble Croitoru MD Electronically signed by Sanda Klein MD Signature Date/Time: 10/05/2021/3:42:54 PM    Final         Scheduled Meds:  Chlorhexidine Gluconate Cloth  6 each Topical Q0600   heparin  5,000 Units Subcutaneous Q8H   methylPREDNISolone (SOLU-MEDROL) injection  40 mg Intravenous Q12H   sevelamer carbonate  1,600 mg Oral TID with meals   sodium chloride flush  3 mL Intravenous Q12H   Continuous Infusions:  sodium chloride     sodium chloride     piperacillin-tazobactam (ZOSYN)  IV       LOS: 1 day    Time spent: 34 minutes    Sharen Hones, MD [facility]   To contact the attending provider between 7A-7P or the covering provider during after hours 7P-7A, please log into the web site [email or web address] and access using universal [place] password for that web site. If you do not have the password, please call the hospital operator.  10/06/2021, 2:08 PM

## 2021-10-06 NOTE — Progress Notes (Signed)
Aware of pt's admission. Pt receives treatment at [facility] on TTS per chart. Awaiting response from clinic regarding pt's chair time. Will assist as needed.  [name] Renal Navigator [phone]

## 2021-10-06 NOTE — Procedures (Signed)
Pt has been reminded several times of the importance of remaining still in his bed.  He continues to cuss to himself and has attempted to get out of bed numerous times.  I have explained to him that if he continues to attempt to behave in this manner I may need to call the Nephrologist as he is unsafe to dialyze.

## 2021-10-06 NOTE — Plan of Care (Signed)

## 2021-10-06 NOTE — Plan of Care (Signed)
Primary RN notified by Dialysis RN about patient refusing dialysis treatment. Patient educated on risks associated with refusal of care. MD notified. Patient care continues.

## 2021-10-06 NOTE — Progress Notes (Signed)
PT Cancellation Note  Patient Details Name: Timothy [name] MRN: [id number] DOB: [birth year]   Cancelled Treatment:    Reason Eval/Treat Not Completed: Patient at procedure or test/unavailable throughout the afternoon. I have attempted to see pt multiple times this afternoon but he remains at HD, will continue to follow and attempt to evaluate as time/schedule allows.   [place] Carbo, PT, DPT   Acute Rehabilitation Department Pager #: [phone]   Sandra Cockayne 10/06/2021, 3:59 PM

## 2021-10-06 NOTE — Procedures (Signed)
Pt started raising his voice upset that he needed to use the bathroom.  A bedpan was offered.  He stated that he needed to "come off treatment right now".  He signed off AMA and all blood was returned and pressure was held at cannulation site.  No bleeding noted from cannulation sites.

## 2021-10-06 NOTE — Progress Notes (Signed)
No changes from previous assessment. °

## 2021-10-07 DIAGNOSIS — I5023 Acute on chronic systolic (congestive) heart failure: Secondary | ICD-10-CM

## 2021-10-07 LAB — CBC WITH DIFFERENTIAL/PLATELET
Abs Immature Granulocytes: 0.1 10*3/uL — ABNORMAL HIGH (ref 0.00–0.07)
Basophils Absolute: 0 10*3/uL (ref 0.0–0.1)
Basophils Relative: 0 %
Eosinophils Absolute: 0 10*3/uL (ref 0.0–0.5)
Eosinophils Relative: 0 %
HCT: 32.9 % — ABNORMAL LOW (ref 39.0–52.0)
Hemoglobin: 10.2 g/dL — ABNORMAL LOW (ref 13.0–17.0)
Immature Granulocytes: 1 %
Lymphocytes Relative: 3 %
Lymphs Abs: 0.5 10*3/uL — ABNORMAL LOW (ref 0.7–4.0)
MCH: 25.9 pg — ABNORMAL LOW (ref 26.0–34.0)
MCHC: 31 g/dL (ref 30.0–36.0)
MCV: 83.5 fL (ref 80.0–100.0)
Monocytes Absolute: 1.8 10*3/uL — ABNORMAL HIGH (ref 0.1–1.0)
Monocytes Relative: 11 %
Neutro Abs: 14.3 10*3/uL — ABNORMAL HIGH (ref 1.7–7.7)
Neutrophils Relative %: 85 %
Platelets: 305 10*3/uL (ref 150–400)
RBC: 3.94 MIL/uL — ABNORMAL LOW (ref 4.22–5.81)
RDW: 19.9 % — ABNORMAL HIGH (ref 11.5–15.5)
WBC: 16.7 10*3/uL — ABNORMAL HIGH (ref 4.0–10.5)
nRBC: 0 % (ref 0.0–0.2)

## 2021-10-07 LAB — PROCALCITONIN: Procalcitonin: 0.42 ng/mL

## 2021-10-07 LAB — HEPATITIS B SURFACE ANTIBODY, QUANTITATIVE: Hep B S AB Quant (Post): 121.4 m[IU]/mL (ref >9.9)

## 2021-10-07 MED ORDER — ISOSORBIDE MONONITRATE ER 30 MG PO TB24
30.0000 mg | ORAL_TABLET | Freq: Every day | ORAL | Status: DC
  Administered 2021-10-07 – 2021-10-09 (×3): 30 mg via ORAL
  Filled 2021-10-07 (×4): qty 1

## 2021-10-07 MED ORDER — AMOXICILLIN 500 MG PO CAPS
500.0000 mg | ORAL_CAPSULE | Freq: Two times a day (BID) | ORAL | Status: DC
  Administered 2021-10-07 – 2021-10-09 (×4): 500 mg via ORAL
  Filled 2021-10-07 (×4): qty 1

## 2021-10-07 MED ORDER — CARVEDILOL 3.125 MG PO TABS
3.1250 mg | ORAL_TABLET | Freq: Two times a day (BID) | ORAL | Status: DC
  Administered 2021-10-07 – 2021-10-08 (×3): 3.125 mg via ORAL
  Filled 2021-10-07 (×3): qty 1

## 2021-10-07 MED ORDER — HYDRALAZINE HCL 25 MG PO TABS
25.0000 mg | ORAL_TABLET | Freq: Three times a day (TID) | ORAL | Status: DC
  Administered 2021-10-07 – 2021-10-09 (×5): 25 mg via ORAL
  Filled 2021-10-07 (×5): qty 1

## 2021-10-07 NOTE — Progress Notes (Signed)
Patient refusing to keep monitoring devices on. Pt walked outside of room and handed his portable telemetry monitor to this writer, states "It keeps waking me up". Patient experiencing dyspnea with exertion but no other obvious signs of distress at this time.  Will continue to follow care plan as patient allows.

## 2021-10-07 NOTE — Progress Notes (Signed)
Patient ID: Timothy [name], male   DOB: [birth year], 60 y.o.   MRN: [id number] S: seen and examined on HD, no complaints. Feels better from a breathing standpoint, ufg 3.5L today. VSS. O:BP (!) 169/101    Pulse 98    Temp 98.5 F (36.9 C) (Oral)    Resp (!) 26    Ht 6\' 1"  (1.854 m)    Wt 67.4 kg    SpO2 99%    BMI 19.60 kg/m   Intake/Output Summary (Last 24 hours) at 10/07/2021 1033 Last data filed at 10/07/2021 0436 Gross per 24 hour  Intake 440 ml  Output 3000 ml  Net -2560 ml   Intake/Output: I/O last 3 completed shifts: In: 800 [P.O.:800] Out: 1324 [Urine:1; Other:4583]  Intake/Output this shift:  No intake/output data recorded. Weight change: 2.46 kg Gen: nad HEENT: +JVD CVS:RRR, + friction rub Resp: scattered rhonchi and wheezes bilaterally Abd:+BS, soft, NT/Nd Ext: no sig edema, LUE AVF +T/B Neuro: awake, alert  Recent Labs  Lab 10/05/21 0804 10/06/21 0930  NA 133* 134*  K 3.8 3.8  CL 93* 93*  CO2 26 26  GLUCOSE 98 225*  BUN 43* 47*  CREATININE 5.64* 5.57*  CALCIUM 8.5* 8.9   Liver Function Tests: No results for input(s): AST, ALT, ALKPHOS, BILITOT, PROT, ALBUMIN in the last 168 hours. No results for input(s): LIPASE, AMYLASE in the last 168 hours. No results for input(s): AMMONIA in the last 168 hours. CBC: Recent Labs  Lab 10/05/21 0804 10/05/21 2021 10/06/21 0930 10/07/21 0331  WBC 9.1 9.0 14.8* 16.7*  NEUTROABS  --   --   --  14.3*  HGB 10.8* 10.8* 10.1* 10.2*  HCT 33.2* 33.3* 30.8* 32.9*  MCV 82.8 81.8 81.9 83.5  PLT 257 290 274 305   Cardiac Enzymes: No results for input(s): CKTOTAL, CKMB, CKMBINDEX, TROPONINI in the last 168 hours. CBG: No results for input(s): GLUCAP in the last 168 hours.  Iron Studies: No results for input(s): IRON, TIBC, TRANSFERRIN, FERRITIN in the last 72 hours. Studies/Results: CT Angio Chest PE W and/or Wo Contrast  Result Date: 10/05/2021 CLINICAL DATA:  Pulmonary embolism (PE) suspected, high prob. Dyspnea.  EXAM: CT ANGIOGRAPHY CHEST WITH CONTRAST TECHNIQUE: Multidetector CT imaging of the chest was performed using the standard protocol during bolus administration of intravenous contrast. Multiplanar CT image reconstructions and MIPs were obtained to evaluate the vascular anatomy. CONTRAST:  78mL OMNIPAQUE IOHEXOL 350 MG/ML SOLN COMPARISON:  Chest radiograph from earlier today. 06/17/2021 chest CT. FINDINGS: Cardiovascular: The study is high quality for the evaluation of pulmonary embolism. There are no filling defects in the central, lobar, segmental or subsegmental pulmonary artery branches to suggest acute pulmonary embolism. Atherosclerotic thoracic aorta with dilated 4.3 cm ascending thoracic aorta, stable. Dilated main pulmonary artery (3.5 cm diameter), stable. Mild-to-moderate cardiomegaly, increased from prior CT. Small pericardial effusion/thickening, increased from prior. Left anterior descending and right coronary atherosclerosis. Mediastinum/Nodes: No discrete thyroid nodules. Unremarkable esophagus. No axillary adenopathy. No pathologically enlarged mediastinal nodes. Mildly prominent 1.3 cm right hilar node (series 5/image 69), new. No additional enlarged hilar nodes. Lungs/Pleura: No pneumothorax. No pleural effusion. Scattered mild platelike scarring versus atelectasis throughout both lungs. Mild diffuse interlobular septal thickening. Mild patchy ground-glass opacity throughout both lungs. Finely nodular patchy foci of peribronchovascular consolidation throughout both lungs, most prominent in basilar right upper lobe. No lung masses. Upper abdomen: Small volume upper abdominal ascites. Musculoskeletal: No aggressive appearing focal osseous lesions. Anasarca. Review of the MIP images confirms the  above findings. IMPRESSION: 1. No pulmonary embolism. 2. Mild-to-moderate cardiomegaly, increased from prior CT. Mild interlobular septal thickening, mild patchy ground-glass opacity and finely nodular patchy  peribronchovascular consolidation throughout both lungs. Anasarca. Small volume upper abdominal ascites. This constellation of findings is most suggestive of congestive heart failure with fluid overload. A component of atypical pneumonia is difficult to exclude by imaging, however is less favored. 3. Small pericardial effusion/thickening, increased from prior CT. 4. Dilated main pulmonary artery, stable, suggesting chronic pulmonary arterial hypertension. 5. Two-vessel coronary atherosclerosis. 6. Stable dilated 4.3 cm ascending thoracic aorta. Recommend annual imaging followup by CTA or MRA. This recommendation follows 2010 ACCF/AHA/AATS/ACR/ASA/SCA/SCAI/SIR/STS/SVM Guidelines for the Diagnosis and Management of Patients with Thoracic Aortic Disease. Circulation. 2010; 121: P950-D326. Aortic aneurysm NOS (ICD10-I71.9). 7. Mild right hilar adenopathy, nonspecific, probably reactive. 8. Aortic Atherosclerosis (ICD10-I70.0). Electronically Signed   By: Ilona Sorrel M.D.   On: 10/05/2021 12:29   ECHOCARDIOGRAM LIMITED  Result Date: 10/05/2021    ECHOCARDIOGRAM LIMITED REPORT   Patient Name:   Timothy [name] Date of Exam: 10/05/2021 Medical Rec #:  [id number]       Height:       73.0 in Accession #:    [id number]      Weight:       150.0 lb Date of Birth:  Nov 12, 1960       BSA:          1.904 m Patient Age:    46 years        BP:           152/104 mmHg Patient Gender: M               HR:           79 bpm. Exam Location:  Inpatient Procedure: Limited Echo, Color Doppler and Cardiac Doppler Indications:    R01.1 Murmur  History:        Patient has prior history of Echocardiogram examinations, most                 recent 06/20/2021. Risk Factors:Hypertension and Polysubstance                 Abuse.  Sonographer:    Raquel Sarna Senior RDCS Referring Phys: [place]  1. Left ventricular ejection fraction, by estimation, is 40 to 45%. The left ventricle has mildly decreased function. The left ventricle  demonstrates global hypokinesis. There is moderate concentric left ventricular hypertrophy. Left ventricular diastolic function could not be evaluated.  2. Right ventricular systolic function is low normal. The right ventricular size is moderately enlarged. Moderately increased right ventricular wall thickness. There is moderately elevated pulmonary artery systolic pressure.  3. A small pericardial effusion is present. The pericardial effusion is circumferential. There is no evidence of cardiac tamponade.  4. Moderate mitral valve regurgitation.  5. Tricuspid valve regurgitation is moderate to severe.  6. The aortic valve is tricuspid. There is mild calcification of the aortic valve. There is moderate thickening of the aortic valve. Aortic valve regurgitation is mild.  7. The inferior vena cava is dilated in size with <50% respiratory variability, suggesting right atrial pressure of 15 mmHg. Comparison(s): A prior study was performed on 06/20/2021. The left ventricular function is worsened. The right ventricle is now dilated. There is marked worsening of the mitral insufficiency and the tricuspid insufficiency. There is a new small pericardial effusion. There is evidence of hypervolemia/ increased right atrial pressure FINDINGS  Left Ventricle: Left ventricular  ejection fraction, by estimation, is 40 to 45%. The left ventricle has mildly decreased function. The left ventricle demonstrates global hypokinesis. The left ventricular internal cavity size was normal in size. There is  moderate concentric left ventricular hypertrophy. Left ventricular diastolic function could not be evaluated. Right Ventricle: The right ventricular size is moderately enlarged. Moderately increased right ventricular wall thickness. Right ventricular systolic function is low normal. There is moderately elevated pulmonary artery systolic pressure. The tricuspid regurgitant velocity is 3.40 m/s, and with an assumed right atrial pressure of 15  mmHg, the estimated right ventricular systolic pressure is 01.6 mmHg. Pericardium: A small pericardial effusion is present. The pericardial effusion is circumferential. There is no evidence of cardiac tamponade. Mitral Valve: Moderate mitral valve regurgitation, with centrally-directed jet. Tricuspid Valve: Tricuspid valve regurgitation is moderate to severe. Aortic Valve: The aortic valve is tricuspid. There is mild calcification of the aortic valve. There is moderate thickening of the aortic valve. Aortic valve regurgitation is mild. Pulmonic Valve: The pulmonic valve was normal in structure. Pulmonic valve regurgitation is trivial. Venous: The inferior vena cava is dilated in size with less than 50% respiratory variability, suggesting right atrial pressure of 15 mmHg. RIGHT VENTRICLE TAPSE (M-mode): 1.5 cm AORTIC VALVE LVOT Vmax:   168.00 cm/s LVOT Vmean:  106.000 cm/s LVOT VTI:    0.251 m TRICUSPID VALVE TR Peak grad:   46.2 mmHg TR Vmax:        340.00 cm/s  SHUNTS Systemic VTI: 0.25 m Dani Gobble Croitoru MD Electronically signed by Sanda Klein MD Signature Date/Time: 10/05/2021/3:42:54 PM    Final     carvedilol  3.125 mg Oral BID WC   Chlorhexidine Gluconate Cloth  6 each Topical Q0600   heparin  5,000 Units Subcutaneous Q8H   hydrALAZINE  25 mg Oral Q8H   isosorbide mononitrate  30 mg Oral Daily   methylPREDNISolone (SOLU-MEDROL) injection  40 mg Intravenous Q12H   sevelamer carbonate  1,600 mg Oral TID with meals   sodium chloride flush  3 mL Intravenous Q12H    BMET    Component Value Date/Time   NA 134 (L) 10/06/2021 0930   K 3.8 10/06/2021 0930   CL 93 (L) 10/06/2021 0930   CO2 26 10/06/2021 0930   GLUCOSE 225 (H) 10/06/2021 0930   BUN 47 (H) 10/06/2021 0930   CREATININE 5.57 (H) 10/06/2021 0930   CREATININE 1.59 (H) 07/29/2015 0943   CALCIUM 8.9 10/06/2021 0930   CALCIUM 8.4 (L) 09/14/2016 1615   GFRNONAA 11 (L) 10/06/2021 0930   GFRNONAA 48 (L) 07/29/2015 0943   GFRAA 7 (L)  05/18/2019 2220   GFRAA 56 (L) 07/29/2015 0943   CBC    Component Value Date/Time   WBC 16.7 (H) 10/07/2021 0331   RBC 3.94 (L) 10/07/2021 0331   HGB 10.2 (L) 10/07/2021 0331   HCT 32.9 (L) 10/07/2021 0331   PLT 305 10/07/2021 0331   MCV 83.5 10/07/2021 0331   MCH 25.9 (L) 10/07/2021 0331   MCHC 31.0 10/07/2021 0331   RDW 19.9 (H) 10/07/2021 0331   LYMPHSABS 0.5 (L) 10/07/2021 0331   MONOABS 1.8 (H) 10/07/2021 0331   EOSABS 0.0 10/07/2021 0331   BASOSABS 0.0 10/07/2021 0331    Dialysis Orders: Center:  [facility]  on TTS . EDW 64.5 kg HD [place] 2K/2Ca  Time 3:45 Heparin none. Access LUE AVF BFR 450 DFR A1.5 Profile 4    Hectoral 1 mcg IV/HD Micera 150 mcg IVP every 2 weeks  Venofer  100 mg IV once per week    Assessment/Plan:  SOB/Congestive heart failure - has history of Grade 1 DD and normal EF by echo on 06/20/2221 but now with left and right heart failure with pericardial friction rub.  CT angio negative for PE.   Underwent HD 10/05/21 evening but s/o early with UF of 1.5 liters ECHO negative for tamponade New reduced LV EF by ECHO, Cardiology following. UF w/ HD as tolerated, HD tomorrow again (on schedule)  Pericardial friction rub - harsh and present throughout systole and diastole.  ECHO with small amount of pericardial effusion.  No evidence of tamponade.  CXR with new mild enlargement of cardiopericardial silhouette.  CT scan with small pericardial effusion/thickining increased from prior studies.  ESRD -  normally TTS but will plan for daily dialysis due to volume overload and symptomatic pulmonary edema. HD again tomorrow, will reassess HD needs thereafter  Hypertension/volume  - UF as tolerated  Anemia  - Hgb stable and no need for ESA at this time  Metabolic bone disease -  continue with home meds  Nutrition - renal diet, heart healthy, fluid restrict  Gean Quint, MD [place]

## 2021-10-07 NOTE — Progress Notes (Signed)
Progress Note  Patient Name: Timothy [name] Date of Encounter: 10/07/2021  [facility] Cardiologist: Dr Ellyn Hack  Subjective   Complains of dyspnea; chest pain with inspiration; presently on dialysis  Inpatient Medications    Scheduled Meds:  Chlorhexidine Gluconate Cloth  6 each Topical Q0600   diltiazem  360 mg Oral Daily   heparin  5,000 Units Subcutaneous Q8H   methylPREDNISolone (SOLU-MEDROL) injection  40 mg Intravenous Q12H   sevelamer carbonate  1,600 mg Oral TID with meals   sodium chloride flush  3 mL Intravenous Q12H   Continuous Infusions:  piperacillin-tazobactam (ZOSYN)  IV 2.25 g (10/07/21 0137)   PRN Meds: acetaminophen **OR** acetaminophen, albuterol, calcium carbonate (dosed in mg elemental calcium), camphor-menthol **AND** hydrOXYzine, docusate sodium, feeding supplement (NEPRO CARB STEADY), hydrALAZINE, ondansetron **OR** ondansetron (ZOFRAN) IV, sorbitol, zolpidem   Vital Signs    Vitals:   10/06/21 1615 10/06/21 1938 10/06/21 2350 10/07/21 0302  BP: (!) 169/116 (!) 159/99 (!) 158/105 (!) 155/102  Pulse: 96 96 89 90  Resp: 17 18 20 18   Temp: 97.8 F (36.6 C) 97.9 F (36.6 C) (!) 97.3 F (36.3 C) 97.6 F (36.4 C)  TempSrc: Temporal Oral Oral Oral  SpO2: 95% 94% 96% 92%  Weight: 67.5 kg   67.3 kg  Height:        Intake/Output Summary (Last 24 hours) at 10/07/2021 0747 Last data filed at 10/07/2021 0436 Gross per 24 hour  Intake 440 ml  Output 3000 ml  Net -2560 ml    Last 3 Weights 10/07/2021 10/06/2021 10/06/2021  Weight (lbs) 148 lb 5.9 oz 148 lb 13 oz 155 lb 6.8 oz  Weight (kg) 67.3 kg 67.5 kg 70.5 kg      Telemetry    Sinus with PVCs and rare ventricular escape beat; no prolonged pauses- Personally Reviewed  Physical Exam   GEN: WD WN NAD Neck: JVD to angle of mandible Cardiac: RRR, loud rub, no gallop Respiratory: exp wheeze GI: Soft, NT/ND MS: no edema Neuro:  Grossly intact Psych: Normal affect   Labs    High  Sensitivity Troponin:   Recent Labs  Lab 10/05/21 0804 10/05/21 1100  TROPONINIHS 167* 156*      Chemistry Recent Labs  Lab 10/05/21 0804 10/06/21 0930  NA 133* 134*  K 3.8 3.8  CL 93* 93*  CO2 26 26  GLUCOSE 98 225*  BUN 43* 47*  CREATININE 5.64* 5.57*  CALCIUM 8.5* 8.9  GFRNONAA 11* 11*  ANIONGAP 14 15      Hematology Recent Labs  Lab 10/05/21 2021 10/06/21 0930 10/07/21 0331  WBC 9.0 14.8* 16.7*  RBC 4.07* 3.76* 3.94*  HGB 10.8* 10.1* 10.2*  HCT 33.3* 30.8* 32.9*  MCV 81.8 81.9 83.5  MCH 26.5 26.9 25.9*  MCHC 32.4 32.8 31.0  RDW 19.6* 19.6* 19.9*  PLT 290 274 305     BNP Recent Labs  Lab 10/05/21 0804  BNP >4,500.0*       Radiology    DG Chest 2 View  Result Date: 10/05/2021 CLINICAL DATA:  Chest pain and dyspnea EXAM: CHEST - 2 VIEW COMPARISON:  10/01/2020 chest radiograph. FINDINGS: New mild enlargement of the cardiopericardial silhouette. Otherwise normal mediastinal contour. No pneumothorax. No pleural effusion. Mild diffuse prominence of the parahilar interstitial markings. Mild curvilinear right lung base scarring versus atelectasis. IMPRESSION: New mild enlargement of the cardiopericardial silhouette. Mild diffuse prominence of the parahilar interstitial markings. Findings could represent mild congestive heart failure or atypical infection. Electronically  Signed   By: Ilona Sorrel M.D.   On: 10/05/2021 09:05   CT Angio Chest PE W and/or Wo Contrast  Result Date: 10/05/2021 CLINICAL DATA:  Pulmonary embolism (PE) suspected, high prob. Dyspnea. EXAM: CT ANGIOGRAPHY CHEST WITH CONTRAST TECHNIQUE: Multidetector CT imaging of the chest was performed using the standard protocol during bolus administration of intravenous contrast. Multiplanar CT image reconstructions and MIPs were obtained to evaluate the vascular anatomy. CONTRAST:  23mL OMNIPAQUE IOHEXOL 350 MG/ML SOLN COMPARISON:  Chest radiograph from earlier today. 06/17/2021 chest CT. FINDINGS:  Cardiovascular: The study is high quality for the evaluation of pulmonary embolism. There are no filling defects in the central, lobar, segmental or subsegmental pulmonary artery branches to suggest acute pulmonary embolism. Atherosclerotic thoracic aorta with dilated 4.3 cm ascending thoracic aorta, stable. Dilated main pulmonary artery (3.5 cm diameter), stable. Mild-to-moderate cardiomegaly, increased from prior CT. Small pericardial effusion/thickening, increased from prior. Left anterior descending and right coronary atherosclerosis. Mediastinum/Nodes: No discrete thyroid nodules. Unremarkable esophagus. No axillary adenopathy. No pathologically enlarged mediastinal nodes. Mildly prominent 1.3 cm right hilar node (series 5/image 69), new. No additional enlarged hilar nodes. Lungs/Pleura: No pneumothorax. No pleural effusion. Scattered mild platelike scarring versus atelectasis throughout both lungs. Mild diffuse interlobular septal thickening. Mild patchy ground-glass opacity throughout both lungs. Finely nodular patchy foci of peribronchovascular consolidation throughout both lungs, most prominent in basilar right upper lobe. No lung masses. Upper abdomen: Small volume upper abdominal ascites. Musculoskeletal: No aggressive appearing focal osseous lesions. Anasarca. Review of the MIP images confirms the above findings. IMPRESSION: 1. No pulmonary embolism. 2. Mild-to-moderate cardiomegaly, increased from prior CT. Mild interlobular septal thickening, mild patchy ground-glass opacity and finely nodular patchy peribronchovascular consolidation throughout both lungs. Anasarca. Small volume upper abdominal ascites. This constellation of findings is most suggestive of congestive heart failure with fluid overload. A component of atypical pneumonia is difficult to exclude by imaging, however is less favored. 3. Small pericardial effusion/thickening, increased from prior CT. 4. Dilated main pulmonary artery, stable,  suggesting chronic pulmonary arterial hypertension. 5. Two-vessel coronary atherosclerosis. 6. Stable dilated 4.3 cm ascending thoracic aorta. Recommend annual imaging followup by CTA or MRA. This recommendation follows 2010 ACCF/AHA/AATS/ACR/ASA/SCA/SCAI/SIR/STS/SVM Guidelines for the Diagnosis and Management of Patients with Thoracic Aortic Disease. Circulation. 2010; 121: X381-W299. Aortic aneurysm NOS (ICD10-I71.9). 7. Mild right hilar adenopathy, nonspecific, probably reactive. 8. Aortic Atherosclerosis (ICD10-I70.0). Electronically Signed   By: Ilona Sorrel M.D.   On: 10/05/2021 12:29   ECHOCARDIOGRAM LIMITED  Result Date: 10/05/2021    ECHOCARDIOGRAM LIMITED REPORT   Patient Name:   BERNARDO BRAYMAN Date of Exam: 10/05/2021 Medical Rec #:  [id number]       Height:       73.0 in Accession #:    [id number]      Weight:       150.0 lb Date of Birth:  Aug 27, 1961       BSA:          1.904 m Patient Age:    39 years        BP:           152/104 mmHg Patient Gender: M               HR:           79 bpm. Exam Location:  Inpatient Procedure: Limited Echo, Color Doppler and Cardiac Doppler Indications:    R01.1 Murmur  History:        Patient has prior history of  Echocardiogram examinations, most                 recent 06/20/2021. Risk Factors:Hypertension and Polysubstance                 Abuse.  Sonographer:    Raquel Sarna Senior RDCS Referring Phys: [place]  1. Left ventricular ejection fraction, by estimation, is 40 to 45%. The left ventricle has mildly decreased function. The left ventricle demonstrates global hypokinesis. There is moderate concentric left ventricular hypertrophy. Left ventricular diastolic function could not be evaluated.  2. Right ventricular systolic function is low normal. The right ventricular size is moderately enlarged. Moderately increased right ventricular wall thickness. There is moderately elevated pulmonary artery systolic pressure.  3. A small pericardial effusion  is present. The pericardial effusion is circumferential. There is no evidence of cardiac tamponade.  4. Moderate mitral valve regurgitation.  5. Tricuspid valve regurgitation is moderate to severe.  6. The aortic valve is tricuspid. There is mild calcification of the aortic valve. There is moderate thickening of the aortic valve. Aortic valve regurgitation is mild.  7. The inferior vena cava is dilated in size with <50% respiratory variability, suggesting right atrial pressure of 15 mmHg. Comparison(s): A prior study was performed on 06/20/2021. The left ventricular function is worsened. The right ventricle is now dilated. There is marked worsening of the mitral insufficiency and the tricuspid insufficiency. There is a new small pericardial effusion. There is evidence of hypervolemia/ increased right atrial pressure FINDINGS  Left Ventricle: Left ventricular ejection fraction, by estimation, is 40 to 45%. The left ventricle has mildly decreased function. The left ventricle demonstrates global hypokinesis. The left ventricular internal cavity size was normal in size. There is  moderate concentric left ventricular hypertrophy. Left ventricular diastolic function could not be evaluated. Right Ventricle: The right ventricular size is moderately enlarged. Moderately increased right ventricular wall thickness. Right ventricular systolic function is low normal. There is moderately elevated pulmonary artery systolic pressure. The tricuspid regurgitant velocity is 3.40 m/s, and with an assumed right atrial pressure of 15 mmHg, the estimated right ventricular systolic pressure is 73.7 mmHg. Pericardium: A small pericardial effusion is present. The pericardial effusion is circumferential. There is no evidence of cardiac tamponade. Mitral Valve: Moderate mitral valve regurgitation, with centrally-directed jet. Tricuspid Valve: Tricuspid valve regurgitation is moderate to severe. Aortic Valve: The aortic valve is tricuspid.  There is mild calcification of the aortic valve. There is moderate thickening of the aortic valve. Aortic valve regurgitation is mild. Pulmonic Valve: The pulmonic valve was normal in structure. Pulmonic valve regurgitation is trivial. Venous: The inferior vena cava is dilated in size with less than 50% respiratory variability, suggesting right atrial pressure of 15 mmHg. RIGHT VENTRICLE TAPSE (M-mode): 1.5 cm AORTIC VALVE LVOT Vmax:   168.00 cm/s LVOT Vmean:  106.000 cm/s LVOT VTI:    0.251 m TRICUSPID VALVE TR Peak grad:   46.2 mmHg TR Vmax:        340.00 cm/s  SHUNTS Systemic VTI: 0.25 m Mihai Croitoru MD Electronically signed by Sanda Klein MD Signature Date/Time: 10/05/2021/3:42:54 PM    Final      Patient Profile     60 y.o. male with past medical history of end-stage renal disease dialysis dependent, hypertension, colon cancer, benign prostatic hypertrophy, substance abuse admitted with acute on chronic combined systolic/diastolic congestive heart failure.  Assessment & Plan    Acute on chronic combined systolic/diastolic congestive heart failure-patient continues to complain of  dyspnea and is volume overloaded on examination.  Continue dialysis for volume excess per nephrology.   Cardiomyopathy-LV function noted to be newly reduced on recent echocardiogram.  Etiology uncertain but patient's blood pressure has been significantly elevated.  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  Discontinue Cardizem.  We will treat with hydralazine/nitrates.  We will also begin low-dose carvedilol.  Titrate medications as tolerated.  Plan cardiac CTA to exclude coronary disease once CHF improves.   Pericardial effusion-loud pericardial friction rub on exam.  However echocardiogram shows small pericardial effusion.  No evidence of tamponade. 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  Worse compared to  previous.  We will likely plan repeat echocardiogram in 3 months once his CHF/hypertension improves. End-stage renal disease-dialysis per nephrology. Hypertension-blood pressure elevated.  Add hydralazine/nitrates and carvedilol as outlined above. Thoracic aortic aneurysm-he will need follow-up CT in 1 year.  For questions or updates, please contact [place] Please consult [email or web address] for contact info under        Signed, Kirk Ruths, MD  10/07/2021, 7:47 AM

## 2021-10-07 NOTE — Progress Notes (Signed)
PROGRESS NOTE    Timothy [name]  NTZ:[id number] DOB: 10/08/1961 DOA: 10/05/2021 PCP: Timothy Mccreedy, MD    Brief Narrative:  Timothy [name] is a 60 y.o. male with medical history significant of BPH; colon cancer; ESRD on HD; HTN; and polysubstance abuse presenting with SOB.  He had a difficulty completing HD due to hypotension.   CT scan of the chest showed cardiomegaly, patchy infiltrates and consolidation.  He also has significant elevation of procalcitonin level.  He is dialyzed on 12/22, 12/23.    Assessment & Plan:   Principal Problem:   Hypervolemia associated with renal insufficiency Active Problems:   HTN (hypertension)   Acute metabolic encephalopathy   ESRD on hemodialysis (HCC)   Polysubstance abuse (HCC)   Pericardial effusion without cardiac tamponade   Acute on chronic systolic CHF (congestive heart failure) (HCC)   Elevated procalcitonin   Aneurysm of thoracic aorta   Multifocal pneumonia   Acute on chronic systolic congestive heart failure. End-stage renal disease with hypervolemia. Patient echocardiogram was 40 to 45% on 06/2021.   Patient is dialyzed on 12/22 and 12/23.  Nephrology is planning another dialysis tomorrow.  Pericardial effusion without cardiac tamponade. Elevated troponin likely due to acute congestive heart failure. Condition stable.  Likely multifocal pneumonia. Acute metabolic encephalopathy. Polydrug abuse. Mental status had improved today.  He still has significant cough with large amount of yellow mucus.  I will change antibiotic to oral amoxicillin for additional 4 days.  Essential hypertension. Aneurysm of thoracic aorta. Restarted diltiazem.  follow-up with PCP for aortic aneurysm     DVT prophylaxis: Heparin Code Status: full Family Communication:  Disposition Plan:      Status is: Inpatient   Remains inpatient appropriate because: Severity of disease,         I/O last 3 completed shifts: In: 800  [P.O.:800] Out: 6759 [Urine:1; Other:4583] Total I/O In: 160 [P.O.:160] Out: 2974 [Other:2974]     Consultants:  Nephrology and cardiology.  Procedures: HD  Antimicrobials: Amoxicillin.  Subjective: Patient condition much improved today, he no longer has any confusion today. He still has some short of breath with exertion, he has a cough with a large amount of yellow mucus. No fever or chills. No abdominal pain or nausea vomiting.  Objective: Vitals:   10/07/21 1000 10/07/21 1030 10/07/21 1135 10/07/21 1203  BP: (!) 155/105 (!) 169/101 (!) 169/100 (!) 171/114  Pulse: 78 98  90  Resp:   (!) 24 (!) 22  Temp:   (!) 97.5 F (36.4 C) 97.6 F (36.4 C)  TempSrc:   Oral Oral  SpO2:   100% 100%  Weight:   63.4 kg   Height:        Intake/Output Summary (Last 24 hours) at 10/07/2021 1350 Last data filed at 10/07/2021 1135 Gross per 24 hour  Intake 400 ml  Output 5974 ml  Net -5574 ml   Filed Weights   10/07/21 0302 10/07/21 0745 10/07/21 1135  Weight: 67.3 kg 67.4 kg 63.4 kg    Examination:  General exam: Appears calm and comfortable  Respiratory system: Clear to auscultation. Respiratory effort normal. Cardiovascular system: S1 & S2 heard, RRR. No JVD, murmurs, rubs, gallops or clicks. No pedal edema. Gastrointestinal system: Abdomen is nondistended, soft and nontender. No organomegaly or masses felt. Normal bowel sounds heard. Central nervous system: Alert and oriented. No focal neurological deficits. Extremities: Symmetric 5 x 5 power. Skin: No rashes, lesions or ulcers Psychiatry: Judgement and insight appear normal. Mood & affect  appropriate.     Data Reviewed: I have personally reviewed following labs and imaging studies  CBC: Recent Labs  Lab 10/05/21 0804 10/05/21 2021 10/06/21 0930 10/07/21 0331  WBC 9.1 9.0 14.8* 16.7*  NEUTROABS  --   --   --  14.3*  HGB 10.8* 10.8* 10.1* 10.2*  HCT 33.2* 33.3* 30.8* 32.9*  MCV 82.8 81.8 81.9 83.5  PLT 257  290 274 124   Basic Metabolic Panel: Recent Labs  Lab 10/05/21 0804 10/06/21 0930  NA 133* 134*  K 3.8 3.8  CL 93* 93*  CO2 26 26  GLUCOSE 98 225*  BUN 43* 47*  CREATININE 5.64* 5.57*  CALCIUM 8.5* 8.9   GFR: Estimated Creatinine Clearance: 12.6 mL/min (A) (by C-G formula based on SCr of 5.57 mg/dL (H)). Liver Function Tests: No results for input(s): AST, ALT, ALKPHOS, BILITOT, PROT, ALBUMIN in the last 168 hours. No results for input(s): LIPASE, AMYLASE in the last 168 hours. No results for input(s): AMMONIA in the last 168 hours. Coagulation Profile: No results for input(s): INR, PROTIME in the last 168 hours. Cardiac Enzymes: No results for input(s): CKTOTAL, CKMB, CKMBINDEX, TROPONINI in the last 168 hours. BNP (last 3 results) No results for input(s): PROBNP in the last 8760 hours. HbA1C: No results for input(s): HGBA1C in the last 72 hours. CBG: No results for input(s): GLUCAP in the last 168 hours. Lipid Profile: No results for input(s): CHOL, HDL, LDLCALC, TRIG, CHOLHDL, LDLDIRECT in the last 72 hours. Thyroid Function Tests: No results for input(s): TSH, T4TOTAL, FREET4, T3FREE, THYROIDAB in the last 72 hours. Anemia Panel: No results for input(s): VITAMINB12, FOLATE, FERRITIN, TIBC, IRON, RETICCTPCT in the last 72 hours. Sepsis Labs: Recent Labs  Lab 10/05/21 1021 10/06/21 0930 10/07/21 0331  PROCALCITON 97.44 0.41 0.42    Recent Results (from the past 240 hour(s))  Resp Panel by RT-PCR (Flu A&B, Covid) Nasopharyngeal Swab     Status: None   Collection Time: 10/05/21  8:02 AM   Specimen: Nasopharyngeal Swab; Nasopharyngeal(NP) swabs in vial transport medium  Result Value Ref Range Status   SARS Coronavirus 2 by RT PCR NEGATIVE NEGATIVE Final    Comment: (NOTE) SARS-CoV-2 target nucleic acids are NOT DETECTED.  The SARS-CoV-2 RNA is generally detectable in upper respiratory specimens during the acute phase of infection. The lowest concentration of  SARS-CoV-2 viral copies this assay can detect is 138 copies/mL. A negative result does not preclude SARS-Cov-2 infection and should not be used as the sole basis for treatment or other patient management decisions. A negative result may occur with  improper specimen collection/handling, submission of specimen other than nasopharyngeal swab, presence of viral mutation(s) within the areas targeted by this assay, and inadequate number of viral copies(<138 copies/mL). A negative result must be combined with clinical observations, patient history, and epidemiological information. The expected result is Negative.  Fact Sheet for Patients:  EntrepreneurPulse.com.au  Fact Sheet for Healthcare Providers:  IncredibleEmployment.be  This test is no t yet approved or cleared by the [place] FDA and  has been authorized for detection and/or diagnosis of SARS-CoV-2 by FDA under an Emergency Use Authorization (EUA). This EUA will remain  in effect (meaning this test can be used) for the duration of the COVID-19 declaration under Section 564(b)(1) of the Act, 21 U.S.C.section 360bbb-3(b)(1), unless the authorization is terminated  or revoked sooner.       Influenza A by PCR NEGATIVE NEGATIVE Final   Influenza B by PCR NEGATIVE NEGATIVE  Final    Comment: (NOTE) The Xpert Xpress SARS-CoV-2/FLU/RSV plus assay is intended as an aid in the diagnosis of influenza from Nasopharyngeal swab specimens and should not be used as a sole basis for treatment. Nasal washings and aspirates are unacceptable for Xpert Xpress SARS-CoV-2/FLU/RSV testing.  Fact Sheet for Patients: EntrepreneurPulse.com.au  Fact Sheet for Healthcare Providers: IncredibleEmployment.be  This test is not yet approved or cleared by the [place] FDA and has been authorized for detection and/or diagnosis of SARS-CoV-2 by FDA under an Emergency Use  Authorization (EUA). This EUA will remain in effect (meaning this test can be used) for the duration of the COVID-19 declaration under Section 564(b)(1) of the Act, 21 U.S.C. section 360bbb-3(b)(1), unless the authorization is terminated or revoked.  Performed at [facility], [place] [address], [place], [place] [address]          Radiology Studies: ECHOCARDIOGRAM LIMITED  Result Date: 10/05/2021    ECHOCARDIOGRAM LIMITED REPORT   Patient Name:   NISHAWN ROTAN Date of Exam: 10/05/2021 Medical Rec #:  [id number]       Height:       73.0 in Accession #:    [id number]      Weight:       150.0 lb Date of Birth:  1961-02-14       BSA:          1.904 m Patient Age:    57 years        BP:           152/104 mmHg Patient Gender: M               HR:           79 bpm. Exam Location:  Inpatient Procedure: Limited Echo, Color Doppler and Cardiac Doppler Indications:    R01.1 Murmur  History:        Patient has prior history of Echocardiogram examinations, most                 recent 06/20/2021. Risk Factors:Hypertension and Polysubstance                 Abuse.  Sonographer:    Raquel Sarna Senior RDCS Referring Phys: [place]  1. Left ventricular ejection fraction, by estimation, is 40 to 45%. The left ventricle has mildly decreased function. The left ventricle demonstrates global hypokinesis. There is moderate concentric left ventricular hypertrophy. Left ventricular diastolic function could not be evaluated.  2. Right ventricular systolic function is low normal. The right ventricular size is moderately enlarged. Moderately increased right ventricular wall thickness. There is moderately elevated pulmonary artery systolic pressure.  3. A small pericardial effusion is present. The pericardial effusion is circumferential. There is no evidence of cardiac tamponade.  4. Moderate mitral valve regurgitation.  5. Tricuspid valve regurgitation is moderate to severe.  6. The aortic valve is tricuspid.  There is mild calcification of the aortic valve. There is moderate thickening of the aortic valve. Aortic valve regurgitation is mild.  7. The inferior vena cava is dilated in size with <50% respiratory variability, suggesting right atrial pressure of 15 mmHg. Comparison(s): A prior study was performed on 06/20/2021. The left ventricular function is worsened. The right ventricle is now dilated. There is marked worsening of the mitral insufficiency and the tricuspid insufficiency. There is a new small pericardial effusion. There is evidence of hypervolemia/ increased right atrial pressure FINDINGS  Left Ventricle: Left ventricular ejection fraction, by estimation,  is 40 to 45%. The left ventricle has mildly decreased function. The left ventricle demonstrates global hypokinesis. The left ventricular internal cavity size was normal in size. There is  moderate concentric left ventricular hypertrophy. Left ventricular diastolic function could not be evaluated. Right Ventricle: The right ventricular size is moderately enlarged. Moderately increased right ventricular wall thickness. Right ventricular systolic function is low normal. There is moderately elevated pulmonary artery systolic pressure. The tricuspid regurgitant velocity is 3.40 m/s, and with an assumed right atrial pressure of 15 mmHg, the estimated right ventricular systolic pressure is 32.0 mmHg. Pericardium: A small pericardial effusion is present. The pericardial effusion is circumferential. There is no evidence of cardiac tamponade. Mitral Valve: Moderate mitral valve regurgitation, with centrally-directed jet. Tricuspid Valve: Tricuspid valve regurgitation is moderate to severe. Aortic Valve: The aortic valve is tricuspid. There is mild calcification of the aortic valve. There is moderate thickening of the aortic valve. Aortic valve regurgitation is mild. Pulmonic Valve: The pulmonic valve was normal in structure. Pulmonic valve regurgitation is trivial.  Venous: The inferior vena cava is dilated in size with less than 50% respiratory variability, suggesting right atrial pressure of 15 mmHg. RIGHT VENTRICLE TAPSE (M-mode): 1.5 cm AORTIC VALVE LVOT Vmax:   168.00 cm/s LVOT Vmean:  106.000 cm/s LVOT VTI:    0.251 m TRICUSPID VALVE TR Peak grad:   46.2 mmHg TR Vmax:        340.00 cm/s  SHUNTS Systemic VTI: 0.25 m Dani Gobble Croitoru MD Electronically signed by Sanda Klein MD Signature Date/Time: 10/05/2021/3:42:54 PM    Final         Scheduled Meds:  amoxicillin  500 mg Oral Q12H   carvedilol  3.125 mg Oral BID WC   Chlorhexidine Gluconate Cloth  6 each Topical Q0600   heparin  5,000 Units Subcutaneous Q8H   hydrALAZINE  25 mg Oral Q8H   isosorbide mononitrate  30 mg Oral Daily   sevelamer carbonate  1,600 mg Oral TID with meals   sodium chloride flush  3 mL Intravenous Q12H   Continuous Infusions:   LOS: 2 days    Time spent: 27 minutes    Sharen Hones, MD [facility]   To contact the attending provider between 7A-7P or the covering provider during after hours 7P-7A, please log into the web site [email or web address] and access using universal [place] password for that web site. If you do not have the password, please call the hospital operator.  10/07/2021, 1:50 PM

## 2021-10-07 NOTE — Progress Notes (Signed)
OT Cancellation Note  Patient Details Name: Timothy [name] MRN: [id number] DOB: May 07, 1961   Cancelled Treatment:    Reason Eval/Treat Not Completed: Patient at procedure or test/ unavailable. Pt. At HD will check back as able.   Clearnce Sorrel [place]-COTA/L 10/07/2021, 10:15 AM

## 2021-10-07 NOTE — Progress Notes (Signed)
PT Cancellation Note  Patient Details Name: Ashraf Mesta MRN: [id number] DOB: August 05, 1961   Cancelled Treatment:    Reason Eval/Treat Not Completed: Patient at procedure or test/unavailable (HD). Will follow-up for PT Evaluation as schedule permits.  Mabeline Caras, PT, DPT Acute Rehabilitation Services  Pager [phone] Office [place] 10/07/2021, 8:25 AM

## 2021-10-07 NOTE — Progress Notes (Signed)
Pt refuses to wear tele monitor.  He also accidentally pulled out his IV.  MD made aware.  Orders to DC tele and IV.  Will cont plan of care.

## 2021-10-07 NOTE — Evaluation (Signed)
Physical Therapy Evaluation Patient Details Name: Timothy Timothy [name] MRN: [id number] DOB: [birth year] Today's Date: 10/07/2021  History of Present Illness  Timothy Timothy [name] is a 60 y.o. male admitted 10/05/21 with SOB, BLE edema, fluid overload. Chest CT showed cardiomegaly, patchy infiltrates and consolidation; suspect multifocal PNA. PMH includes ESRD (HD TTS), colon cancer, CHF, polysubstance abuse; recent hospitalization for ex lap 8-06/2021.   Clinical Impression  Pt presents with an overall decrease in functional mobility secondary to above. PTA, pt reports independent, although activity levels limited by fatigue with HD; lives on 3rd floor apartment with wife who works. Today, pt agreeable to brief bout of activity, ambulatory with and without RW, supervision for safety; pt reports stability improved with walker use, requesting one for home. Pt also requests stair training, but declining additional activity this session secondary to fatigue; pt falling asleep at times during conversation (RN aware). Pt would benefit from continued acute PT services to maximize functional mobility and independence prior to d/c home.      Recommendations for follow up therapy are one component of a multi-disciplinary discharge planning process, led by the attending physician.  Recommendations may be updated based on patient status, additional functional criteria and insurance authorization.  Follow Up Recommendations Home health PT    Assistance Recommended at Discharge Intermittent Supervision/Assistance  Functional Status Assessment Patient has had a recent decline in their functional status and demonstrates the ability to make significant improvements in function in a reasonable and predictable amount of time.  Equipment Recommendations  Rolling walker (2 wheels)    Recommendations for Other Services       Precautions / Restrictions Precautions Precautions: Fall Restrictions Weight Bearing Restrictions:  No      Mobility  Bed Mobility               General bed mobility comments: Received sitting in recliner    Transfers Overall transfer level: Independent Equipment used: None                    Ambulation/Gait Ambulation/Gait assistance: Supervision Gait Distance (Feet): 12 Feet Assistive device: None;Rolling walker (2 wheels) Gait Pattern/deviations: Step-through pattern;Decreased stride length;Trunk flexed       General Gait Details: pt ambulating without DME to walker, walking a few steps forwards/backwards with RW, then back to recliner without DME, supervision for safety; pt declines further distance secondary to fatigue  Stairs Stairs:  (pt reports wanting to work on stairs with PT, but declines today secondary to fatigue)          Wheelchair Mobility    Modified Rankin (Stroke Patients Only)       Balance     Sitting balance-Leahy Scale: Good       Standing balance-Leahy Scale: Fair                               Pertinent Vitals/Pain Pain Assessment: Faces Faces Pain Scale: No hurt Pain Intervention(s): Monitored during session    Home Living Family/patient expects to be discharged to:: Private residence Living Arrangements: Spouse/significant other;Children Available Help at Discharge: Family;Available PRN/intermittently Type of Home: Apartment Home Access: Stairs to enter Entrance Stairs-Rails: Left Entrance Stairs-Number of Steps: 2 flights to 3rd story apt   Home Layout: One level Home Equipment: Marine scientist - single point Additional Comments: Lives with wife who works, children in school    Prior Function Prior Level of Function : Independent/Modified Independent  Mobility Comments: Typically independent without DME; physical activity limited by HD (pt reports typically worn out post-HD and Timothy [name] after HD). No longer drives, does not work       Journalist, newspaper        Extremity/Trunk  Assessment   Upper Extremity Assessment Upper Extremity Assessment: Generalized weakness    Lower Extremity Assessment Lower Extremity Assessment: Generalized weakness       Communication   Communication: No difficulties  Cognition Arousal/Alertness: Awake/alert;Lethargic Behavior During Therapy: WFL for tasks assessed/performed;Flat affect                                   General Comments: Pt alert and answering questions appropriately, then various bouts of falling asleep during conversation requiring cues to wake back up. pt seems frustrated by idea of mobilizing since he is fatigued post-HD        General Comments General comments (skin integrity, edema, etc.): discussed DME needs - pt wanting RW for home use, concerned about getting up/down 2 flights of stairs to apartment but does not want to practice today    Exercises     Assessment/Plan    PT Assessment Patient needs continued PT services  PT Problem List Decreased strength;Decreased activity tolerance;Decreased balance;Decreased mobility       PT Treatment Interventions DME instruction;Gait training;Stair training;Functional mobility training;Therapeutic activities;Therapeutic exercise;Balance training;Patient/family education    PT Goals (Current goals can be found in the Care Plan section)  Acute Rehab PT Goals Patient Stated Goal: I need to practice stairs PT Goal Formulation: With patient Time For Goal Achievement: 10/21/21 Potential to Achieve Goals: Good    Frequency Min 3X/week   Barriers to discharge        Co-evaluation               AM-PAC PT "6 Clicks" Mobility  Outcome Measure Help needed turning from your back to your side while in a flat bed without using bedrails?: None Help needed moving from lying on your back to sitting on the side of a flat bed without using bedrails?: None Help needed moving to and from a bed to a chair (including a wheelchair)?: None Help  needed standing up from a chair using your arms (e.g., wheelchair or bedside chair)?: None Help needed to walk in hospital room?: A Little Help needed climbing 3-5 steps with a railing? : A Little 6 Click Score: 22    End of Session   Activity Tolerance: Patient limited by fatigue Patient left: in chair;with call bell/phone within reach Nurse Communication: Mobility status PT Visit Diagnosis: Other abnormalities of gait and mobility (R26.89);Muscle weakness (generalized) (M62.81)    Time: 1610-9604 PT Time Calculation (min) (ACUTE ONLY): 9 min   Charges:   PT Evaluation $PT Eval Low Complexity: [place], PT, DPT Acute Rehabilitation Services  Pager [phone] Office [place] 10/07/2021, 1:31 PM

## 2021-10-08 DIAGNOSIS — N186 End stage renal disease: Secondary | ICD-10-CM

## 2021-10-08 DIAGNOSIS — Z992 Dependence on renal dialysis: Secondary | ICD-10-CM

## 2021-10-08 DIAGNOSIS — I10 Essential (primary) hypertension: Secondary | ICD-10-CM

## 2021-10-08 LAB — RENAL FUNCTION PANEL
Albumin: 2.6 g/dL — ABNORMAL LOW (ref 3.5–5.0)
Anion gap: 11 (ref 5–15)
BUN: 47 mg/dL — ABNORMAL HIGH (ref 6–20)
CO2: 27 mmol/L (ref 22–32)
Calcium: 8.7 mg/dL — ABNORMAL LOW (ref 8.9–10.3)
Chloride: 98 mmol/L (ref 98–111)
Creatinine, Ser: 4.67 mg/dL — ABNORMAL HIGH (ref 0.61–1.24)
GFR, Estimated: 14 mL/min — ABNORMAL LOW (ref >60)
Glucose, Bld: 96 mg/dL (ref 70–99)
Phosphorus: 3.2 mg/dL (ref 2.5–4.6)
Potassium: 3.5 mmol/L (ref 3.5–5.1)
Sodium: 136 mmol/L (ref 135–145)

## 2021-10-08 LAB — CBC
HCT: 30.8 % — ABNORMAL LOW (ref 39.0–52.0)
Hemoglobin: 9.7 g/dL — ABNORMAL LOW (ref 13.0–17.0)
MCH: 26.5 pg (ref 26.0–34.0)
MCHC: 31.5 g/dL (ref 30.0–36.0)
MCV: 84.2 fL (ref 80.0–100.0)
Platelets: 338 10*3/uL (ref 150–400)
RBC: 3.66 MIL/uL — ABNORMAL LOW (ref 4.22–5.81)
RDW: 20.4 % — ABNORMAL HIGH (ref 11.5–15.5)
WBC: 13.6 10*3/uL — ABNORMAL HIGH (ref 4.0–10.5)
nRBC: 0 % (ref 0.0–0.2)

## 2021-10-08 MED ORDER — CARVEDILOL 6.25 MG PO TABS
6.2500 mg | ORAL_TABLET | Freq: Two times a day (BID) | ORAL | Status: DC
  Administered 2021-10-08 – 2021-10-09 (×2): 6.25 mg via ORAL
  Filled 2021-10-08 (×2): qty 1

## 2021-10-08 NOTE — Progress Notes (Signed)
Progress Note  Patient Name: Timothy [name] Date of Encounter: 10/08/2021  [facility] Cardiologist: Dr Ellyn Hack  Subjective   BP 156/111 this morning.  He reports chest pain when he eats, otherwise denies any exertional chest pain.  Reports shortness of breath  Inpatient Medications    Scheduled Meds:  amoxicillin  500 mg Oral BID   carvedilol  3.125 mg Oral BID WC   Chlorhexidine Gluconate Cloth  6 each Topical Q0600   heparin  5,000 Units Subcutaneous Q8H   hydrALAZINE  25 mg Oral Q8H   isosorbide mononitrate  30 mg Oral Daily   sevelamer carbonate  1,600 mg Oral TID with meals   sodium chloride flush  3 mL Intravenous Q12H   Continuous Infusions:   PRN Meds: acetaminophen **OR** acetaminophen, albuterol, calcium carbonate (dosed in mg elemental calcium), camphor-menthol **AND** hydrOXYzine, docusate sodium, feeding supplement (NEPRO CARB STEADY), hydrALAZINE, ondansetron **OR** ondansetron (ZOFRAN) IV, sorbitol, zolpidem   Vital Signs    Vitals:   10/07/21 1939 10/08/21 0015 10/08/21 0400 10/08/21 0828  BP: (!) 152/85 (!) 176/104 (!) 156/107 (!) 156/111  Pulse: 90 100 99 98  Resp: 20 20 20 20   Temp: 97.6 F (36.4 C) 99.5 F (37.5 C) (!) 97.5 F (36.4 C) (!) 97.5 F (36.4 C)  TempSrc: Oral Axillary Axillary Axillary  SpO2: 95% 97% 97% 97%  Weight:   66.2 kg   Height:        Intake/Output Summary (Last 24 hours) at 10/08/2021 1048 Last data filed at 10/07/2021 1135 Gross per 24 hour  Intake 160 ml  Output 2974 ml  Net -2814 ml    Last 3 Weights 10/08/2021 10/07/2021 10/07/2021  Weight (lbs) 145 lb 15.1 oz 139 lb 12.4 oz 148 lb 9.4 oz  Weight (kg) 66.2 kg 63.4 kg 67.4 kg      Telemetry    Sinus with PVCs and rare ventricular escape beat; no prolonged pauses- Personally Reviewed  Physical Exam   GEN: WD WN NAD Neck: + JVD Cardiac: RRR, no murmurs Respiratory: exp wheeze GI: Soft, NT/ND MS: no edema Neuro:  Grossly intact Psych: Normal  affect   Labs    High Sensitivity Troponin:   Recent Labs  Lab 10/05/21 0804 10/05/21 1100  TROPONINIHS 167* 156*      Chemistry Recent Labs  Lab 10/05/21 0804 10/06/21 0930  NA 133* 134*  K 3.8 3.8  CL 93* 93*  CO2 26 26  GLUCOSE 98 225*  BUN 43* 47*  CREATININE 5.64* 5.57*  CALCIUM 8.5* 8.9  GFRNONAA 11* 11*  ANIONGAP 14 15      Hematology Recent Labs  Lab 10/05/21 2021 10/06/21 0930 10/07/21 0331  WBC 9.0 14.8* 16.7*  RBC 4.07* 3.76* 3.94*  HGB 10.8* 10.1* 10.2*  HCT 33.3* 30.8* 32.9*  MCV 81.8 81.9 83.5  MCH 26.5 26.9 25.9*  MCHC 32.4 32.8 31.0  RDW 19.6* 19.6* 19.9*  PLT 290 274 305     BNP Recent Labs  Lab 10/05/21 0804  BNP >4,500.0*       Radiology    No results found.   Patient Profile     60 y.o. male with past medical history of end-stage renal disease dialysis dependent, hypertension, colon cancer, benign prostatic hypertrophy, substance abuse admitted with acute on chronic combined systolic/diastolic congestive heart failure.  Assessment & Plan    Acute on chronic combined systolic/diastolic congestive heart failure-patient continues to complain of dyspnea and is volume overloaded on examination.  Continue  dialysis for volume excess per nephrology.    Cardiomyopathy-LV function noted to be newly reduced on recent echocardiogram.  Etiology uncertain but patient's blood pressure has been significantly elevated.  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  Discontinue Cardizem.  We will treat with hydralazine/nitrates.  Started on Coreg, will increase dose to 6.25 mg twice daily.  Titrate medications as tolerated.  Likely plan stress test as outpatient to rule out significant ischemia.  Pericardial effusion-loud pericardial friction rub on exam.  However echocardiogram shows small pericardial effusion.  No evidence of tamponade.  Valvular heart disease-moderate to severe tricuspid regurgitation, moderate mitral regurgitation and mild  aortic insufficiency noted on echocardiogram.  Worse compared to previous.  We will likely plan repeat echocardiogram in 3 months once his CHF/hypertension improves.  End-stage renal disease-dialysis per nephrology.  Hypertension-blood pressure elevated.  Add hydralazine/nitrates and carvedilol as outlined above.  Thoracic aortic aneurysm-he will need follow-up CT in 1 year.  For questions or updates, please contact [place] Please consult [email or web address] for contact info under        Signed, Donato Heinz, MD  10/08/2021, 10:48 AM

## 2021-10-08 NOTE — Progress Notes (Signed)
PROGRESS NOTE    Timothy [name]  OFB:[id number] DOB: 03-21-1961 DOA: 10/05/2021 PCP: [name], MD   Chief complaint.  Shortness of breath. Brief Narrative:  Timothy [name] is a 60 y.o. male with medical history significant of BPH; colon cancer; ESRD on HD; HTN; and polysubstance abuse presenting with SOB.  He had a difficulty completing HD due to hypotension.   CT scan of the chest showed cardiomegaly, patchy infiltrates and consolidation.  He also has significant elevation of procalcitonin level.  He is dialyzed daily since admission.   Assessment & Plan:   Principal Problem:   Hypervolemia associated with renal insufficiency Active Problems:   HTN (hypertension)   Acute metabolic encephalopathy   ESRD on hemodialysis (HCC)   Polysubstance abuse (HCC)   Pericardial effusion without cardiac tamponade   Acute on chronic combined systolic and diastolic CHF (congestive heart failure) (HCC)   Elevated procalcitonin   Aneurysm of thoracic aorta   Multifocal pneumonia  Acute on chronic systolic congestive heart failure. End-stage renal disease with hypervolemia. Patient echocardiogram was 40 to 45% on 06/2021.   Patient has been receiving daily hemodialysis.  He still short of breath this morning, will keep patient until daily dialysis is not needed.  Pericardial effusion without cardiac tamponade. Elevated troponin likely due to acute congestive heart failure. Condition is stable.  Multifocal pneumonia. Ruled in. Acute metabolic encephalopathy. Polydrug abuse. Patient no longer has any confusion.  Continue amoxicillin for additional 3 days per  Essential hypertension. Aneurysm of thoracic aorta. Follow-up with PCP as outpatient.  DVT prophylaxis: Heparin Code Status: full Family Communication:  Disposition Plan:    Status is: Inpatient  Remains inpatient appropriate because: Severity of disease, requiring daily hemodialysis.   I/O last 3 completed  shifts: In: 400 [P.O.:400] Out: 2974 [Other:2974] Total I/O In: 240 [P.O.:240] Out: -      Consultants:  Nephrology, cardiology.  Procedures: HD  Antimicrobials: Amoxicillin.  Subjective: Patient woke up this morning had "panic attack", associate with shortness of breath.  He was nauseated, vomited 1 time. No abdominal pain. No fever or chills.  Objective: Vitals:   10/08/21 1120 10/08/21 1130 10/08/21 1200 10/08/21 1230  BP: (!) 155/104 (!) 154/91 (!) 170/110 (!) 150/100  Pulse: 100 100 (!) 102 97  Resp: 18 18    Temp: 98.4 F (36.9 C)     TempSrc: Oral     SpO2: 100% 100%    Weight: 64.7 kg     Height:        Intake/Output Summary (Last 24 hours) at 10/08/2021 1237 Last data filed at 10/08/2021 1100 Gross per 24 hour  Intake 240 ml  Output --  Net 240 ml   Filed Weights   10/07/21 1135 10/08/21 0400 10/08/21 1120  Weight: 63.4 kg 66.2 kg 64.7 kg    Examination:  General exam: Appears calm and comfortable  Respiratory system: Decreased breathing sounds. Respiratory effort normal. Cardiovascular system: S1 & S2 heard, RRR. No JVD, murmurs, rubs, gallops or clicks. No pedal edema. Gastrointestinal system: Abdomen is nondistended, soft and nontender. No organomegaly or masses felt. Normal bowel sounds heard. Central nervous system: Alert and oriented. No focal neurological deficits. Extremities: Symmetric 5 x 5 power. Skin: No rashes, lesions or ulcers Psychiatry: Judgement and insight appear normal. Mood & affect appropriate.     Data Reviewed: I have personally reviewed following labs and imaging studies  CBC: Recent Labs  Lab 10/05/21 0804 10/05/21 2021 10/06/21 0930 10/07/21 0331 10/08/21 1127  WBC  9.1 9.0 14.8* 16.7* 13.6*  NEUTROABS  --   --   --  14.3*  --   HGB 10.8* 10.8* 10.1* 10.2* 9.7*  HCT 33.2* 33.3* 30.8* 32.9* 30.8*  MCV 82.8 81.8 81.9 83.5 84.2  PLT 257 290 274 305 638   Basic Metabolic Panel: Recent Labs  Lab  10/05/21 0804 10/06/21 0930 10/08/21 1127  NA 133* 134* 136  K 3.8 3.8 3.5  CL 93* 93* 98  CO2 26 26 27   GLUCOSE 98 225* 96  BUN 43* 47* 47*  CREATININE 5.64* 5.57* 4.67*  CALCIUM 8.5* 8.9 8.7*  PHOS  --   --  3.2   GFR: Estimated Creatinine Clearance: 15.4 mL/min (A) (by C-G formula based on SCr of 4.67 mg/dL (H)). Liver Function Tests: Recent Labs  Lab 10/08/21 1127  ALBUMIN 2.6*   No results for input(s): LIPASE, AMYLASE in the last 168 hours. No results for input(s): AMMONIA in the last 168 hours. Coagulation Profile: No results for input(s): INR, PROTIME in the last 168 hours. Cardiac Enzymes: No results for input(s): CKTOTAL, CKMB, CKMBINDEX, TROPONINI in the last 168 hours. BNP (last 3 results) No results for input(s): PROBNP in the last 8760 hours. HbA1C: No results for input(s): HGBA1C in the last 72 hours. CBG: No results for input(s): GLUCAP in the last 168 hours. Lipid Profile: No results for input(s): CHOL, HDL, LDLCALC, TRIG, CHOLHDL, LDLDIRECT in the last 72 hours. Thyroid Function Tests: No results for input(s): TSH, T4TOTAL, FREET4, T3FREE, THYROIDAB in the last 72 hours. Anemia Panel: No results for input(s): VITAMINB12, FOLATE, FERRITIN, TIBC, IRON, RETICCTPCT in the last 72 hours. Sepsis Labs: Recent Labs  Lab 10/05/21 1021 10/06/21 0930 10/07/21 0331  PROCALCITON 97.44 0.41 0.42    Recent Results (from the past 240 hour(s))  Resp Panel by RT-PCR (Flu A&B, Covid) Nasopharyngeal Swab     Status: None   Collection Time: 10/05/21  8:02 AM   Specimen: Nasopharyngeal Swab; Nasopharyngeal(NP) swabs in vial transport medium  Result Value Ref Range Status   SARS Coronavirus 2 by RT PCR NEGATIVE NEGATIVE Final    Comment: (NOTE) SARS-CoV-2 target nucleic acids are NOT DETECTED.  The SARS-CoV-2 RNA is generally detectable in upper respiratory specimens during the acute phase of infection. The lowest concentration of SARS-CoV-2 viral copies this  assay can detect is 138 copies/mL. A negative result does not preclude SARS-Cov-2 infection and should not be used as the sole basis for treatment or other patient management decisions. A negative result may occur with  improper specimen collection/handling, submission of specimen other than nasopharyngeal swab, presence of viral mutation(s) within the areas targeted by this assay, and inadequate number of viral copies(<138 copies/mL). A negative result must be combined with clinical observations, patient history, and epidemiological information. The expected result is Negative.  Fact Sheet for Patients:  EntrepreneurPulse.com.au  Fact Sheet for Healthcare Providers:  IncredibleEmployment.be  This test is no t yet approved or cleared by the [place] FDA and  has been authorized for detection and/or diagnosis of SARS-CoV-2 by FDA under an Emergency Use Authorization (EUA). This EUA will remain  in effect (meaning this test can be used) for the duration of the COVID-19 declaration under Section 564(b)(1) of the Act, 21 U.S.C.section 360bbb-3(b)(1), unless the authorization is terminated  or revoked sooner.       Influenza A by PCR NEGATIVE NEGATIVE Final   Influenza B by PCR NEGATIVE NEGATIVE Final    Comment: (NOTE) The Xpert Xpress  SARS-CoV-2/FLU/RSV plus assay is intended as an aid in the diagnosis of influenza from Nasopharyngeal swab specimens and should not be used as a sole basis for treatment. Nasal washings and aspirates are unacceptable for Xpert Xpress SARS-CoV-2/FLU/RSV testing.  Fact Sheet for Patients: EntrepreneurPulse.com.au  Fact Sheet for Healthcare Providers: IncredibleEmployment.be  This test is not yet approved or cleared by the [place] FDA and has been authorized for detection and/or diagnosis of SARS-CoV-2 by FDA under an Emergency Use Authorization (EUA). This EUA will  remain in effect (meaning this test can be used) for the duration of the COVID-19 declaration under Section 564(b)(1) of the Act, 21 U.S.C. section 360bbb-3(b)(1), unless the authorization is terminated or revoked.  Performed at [facility], [place] [address] [place],  [address]          Radiology Studies: No results found.      Scheduled Meds:  amoxicillin  500 mg Oral BID   carvedilol  6.25 mg Oral BID WC   Chlorhexidine Gluconate Cloth  6 each Topical Q0600   heparin  5,000 Units Subcutaneous Q8H   hydrALAZINE  25 mg Oral Q8H   isosorbide mononitrate  30 mg Oral Daily   sevelamer carbonate  1,600 mg Oral TID with meals   sodium chloride flush  3 mL Intravenous Q12H   Continuous Infusions:   LOS: 3 days    Time spent: 26 minutes    Sharen Hones, MD [facility]   To contact the attending provider between 7A-7P or the covering provider during after hours 7P-7A, please log into the web site [email or web address] and access using universal [place] password for that web site. If you do not have the password, please call the hospital operator.  10/08/2021, 12:37 PM

## 2021-10-08 NOTE — Plan of Care (Signed)

## 2021-10-08 NOTE — Progress Notes (Signed)
Patient ID: Timothy [name], male   DOB: 09-11-1961, 60 y.o.   MRN: [id number] S: seen and examined bedside this am. Net uf 2974cc with HD yesterday. He reports that he was relatively doing okay until he ate breakfast and his SOB worsened again. O:BP (!) 156/111 (BP Location: Right Arm)    Pulse 98    Temp (!) 97.5 F (36.4 C) (Axillary)    Resp 20    Ht 6\' 1"  (1.854 m)    Wt 66.2 kg    SpO2 97%    BMI 19.26 kg/m   Intake/Output Summary (Last 24 hours) at 10/08/2021 0932 Last data filed at 10/07/2021 1135 Gross per 24 hour  Intake 160 ml  Output 2974 ml  Net -2814 ml   Intake/Output: I/O last 3 completed shifts: In: 400 [P.O.:400] Out: 2974 [Other:2974]  Intake/Output this shift:  No intake/output data recorded. Weight change: -3.1 kg Gen: in resp distress HEENT: +JVD CVS:RRR, + friction rub Resp: scattered rhonchi+rales and wheezes bilaterally, inc'ed WOB, able to speak in full sentences Abd:+BS, soft, NT/Nd Ext: no sig edema, LUE AVF +T/B Neuro: awake, alert  Recent Labs  Lab 10/05/21 0804 10/06/21 0930  NA 133* 134*  K 3.8 3.8  CL 93* 93*  CO2 26 26  GLUCOSE 98 225*  BUN 43* 47*  CREATININE 5.64* 5.57*  CALCIUM 8.5* 8.9   Liver Function Tests: No results for input(s): AST, ALT, ALKPHOS, BILITOT, PROT, ALBUMIN in the last 168 hours. No results for input(s): LIPASE, AMYLASE in the last 168 hours. No results for input(s): AMMONIA in the last 168 hours. CBC: Recent Labs  Lab 10/05/21 0804 10/05/21 2021 10/06/21 0930 10/07/21 0331  WBC 9.1 9.0 14.8* 16.7*  NEUTROABS  --   --   --  14.3*  HGB 10.8* 10.8* 10.1* 10.2*  HCT 33.2* 33.3* 30.8* 32.9*  MCV 82.8 81.8 81.9 83.5  PLT 257 290 274 305   Cardiac Enzymes: No results for input(s): CKTOTAL, CKMB, CKMBINDEX, TROPONINI in the last 168 hours. CBG: No results for input(s): GLUCAP in the last 168 hours.  Iron Studies: No results for input(s): IRON, TIBC, TRANSFERRIN, FERRITIN in the last 72  hours. Studies/Results: No results found.  amoxicillin  500 mg Oral BID   carvedilol  3.125 mg Oral BID WC   Chlorhexidine Gluconate Cloth  6 each Topical Q0600   heparin  5,000 Units Subcutaneous Q8H   hydrALAZINE  25 mg Oral Q8H   isosorbide mononitrate  30 mg Oral Daily   sevelamer carbonate  1,600 mg Oral TID with meals   sodium chloride flush  3 mL Intravenous Q12H    BMET    Component Value Date/Time   NA 134 (L) 10/06/2021 0930   K 3.8 10/06/2021 0930   CL 93 (L) 10/06/2021 0930   CO2 26 10/06/2021 0930   GLUCOSE 225 (H) 10/06/2021 0930   BUN 47 (H) 10/06/2021 0930   CREATININE 5.57 (H) 10/06/2021 0930   CREATININE 1.59 (H) 07/29/2015 0943   CALCIUM 8.9 10/06/2021 0930   CALCIUM 8.4 (L) 09/14/2016 1615   GFRNONAA 11 (L) 10/06/2021 0930   GFRNONAA 48 (L) 07/29/2015 0943   GFRAA 7 (L) 05/18/2019 2220   GFRAA 56 (L) 07/29/2015 0943   CBC    Component Value Date/Time   WBC 16.7 (H) 10/07/2021 0331   RBC 3.94 (L) 10/07/2021 0331   HGB 10.2 (L) 10/07/2021 0331   HCT 32.9 (L) 10/07/2021 0331   PLT 305 10/07/2021 0331  MCV 83.5 10/07/2021 0331   MCH 25.9 (L) 10/07/2021 0331   MCHC 31.0 10/07/2021 0331   RDW 19.9 (H) 10/07/2021 0331   LYMPHSABS 0.5 (L) 10/07/2021 0331   MONOABS 1.8 (H) 10/07/2021 0331   EOSABS 0.0 10/07/2021 0331   BASOSABS 0.0 10/07/2021 0331    Dialysis Orders: Center:  [facility]  on TTS . EDW 64.5 kg HD Bath 2K/2Ca  Time 3:45 Heparin none. Access LUE AVF BFR 450 DFR A1.5 Profile 4    Hectoral 1 mcg IV/HD Micera 150 mcg IVP every 2 weeks  Venofer  100 mg IV once per week    Assessment/Plan:  SOB/Congestive heart failure - has history of Grade 1 DD and normal EF by echo on 06/20/2221 but now with left and right heart failure with pericardial friction rub.  CT angio negative for PE.   ECHO negative for tamponade New reduced LV EF by ECHO, Cardiology following. UF w/ HD as tolerated, HD today again (on schedule), next treatment planned for 12/26  (tentative plan)  Pericardial friction rub - harsh and present throughout systole and diastole.  ECHO with small amount of pericardial effusion.  No evidence of tamponade.  CXR with new mild enlargement of cardiopericardial silhouette.  CT scan with small pericardial effusion/thickining increased from prior studies.  ESRD -  normally TTS but will plan for daily dialysis due to volume overload and symptomatic pulmonary edema. HD again today, will reassess HD needs thereafter  Hypertension/volume  - UF as tolerated  Anemia  - Hgb stable and no need for ESA at this time  Metabolic bone disease -  continue with home meds  Nutrition - renal diet, heart healthy, fluid restrict  Gean Quint, MD [place]

## 2021-10-09 MED ORDER — HYDRALAZINE HCL 25 MG PO TABS: 25.0000 mg | ORAL_TABLET | Freq: Three times a day (TID) | ORAL | 0 refills | Status: DC

## 2021-10-09 MED ORDER — AMOXICILLIN 500 MG PO CAPS: 500.0000 mg | ORAL_CAPSULE | Freq: Two times a day (BID) | ORAL | 0 refills | Status: AC

## 2021-10-09 MED ORDER — ISOSORBIDE MONONITRATE ER 30 MG PO TB24: 30.0000 mg | ORAL_TABLET | Freq: Every day | ORAL | 0 refills | Status: DC

## 2021-10-09 MED ORDER — CARVEDILOL 6.25 MG PO TABS: 6.2500 mg | ORAL_TABLET | Freq: Two times a day (BID) | ORAL | 0 refills | Status: DC

## 2021-10-09 NOTE — Discharge Summary (Signed)
Physician Discharge Summary  Patient ID: Timothy [name] MRN: [id number] DOB/AGE: Jan 08, 1961 60 y.o.  Admit date: 10/05/2021 Discharge date: 10/09/2021  Admission Diagnoses:  Discharge Diagnoses:  Principal Problem:   Hypervolemia associated with renal insufficiency Active Problems:   HTN (hypertension)   Acute metabolic encephalopathy   ESRD on hemodialysis (HCC)   Polysubstance abuse (HCC)   Pericardial effusion without cardiac tamponade   Acute on chronic combined systolic and diastolic CHF (congestive heart failure) (HCC)   Elevated procalcitonin   Aneurysm of thoracic aorta   Multifocal pneumonia   Discharged Condition: good  Timothy [name] is a 60 y.o. male with medical history significant of BPH; colon cancer; ESRD on HD; HTN; and polysubstance abuse presenting with SOB.  He had a difficulty completing HD due to hypotension.   CT scan of the chest showed cardiomegaly, patchy infiltrates and consolidation.  He also has significant elevation of procalcitonin level.  He is dialyzed on 12/22, 12/23, 12/24.  Acute on chronic systolic congestive heart failure. End-stage renal disease with hypervolemia. Patient echocardiogram was 40 to 45%    Patient is dialyzed daily x3.   His condition had improved, discussed with nephrology, patient does not need to dialyze her today and tomorrow.  At this point he is medically stable to be discharged.  Cardiology has adjusted his medicine for congestive heart failure including beta-blocker, nitrates and hydralazine.  Patient will be followed by cardiology in 2 weeks.  Pericardial effusion without cardiac tamponade. Elevated troponin likely due to acute congestive heart failure. Condition stable.   multifocal pneumonia ruled in. Acute metabolic encephalopathy. Polydrug abuse. Mental status had improved.  He still has significant cough with large amount of yellow mucus.  We will continue Augmentin for additional 3 days.  Essential  hypertension. Aneurysm of thoracic aorta. Follow-up with PCP for aortic aneurysm  Consults: Cardiology, Nephrology  Significant Diagnostic Studies:  Echo: Left ventricular ejection fraction, by estimation, is 40 to 45%. The left ventricle has mildly decreased function. The left ventricle demonstrates global hypokinesis. There is moderate concentric left ventricular hypertrophy. Left ventricular diastolic function could not be evaluated. 1. Right ventricular systolic function is low normal. The right ventricular size is moderately enlarged. Moderately increased right ventricular wall thickness. There is moderately elevated pulmonary artery systolic pressure. 2. A small pericardial effusion is present. The pericardial effusion is circumferential. There is no evidence of cardiac tamponade. 3. 4. Moderate mitral valve regurgitation. 5. Tricuspid valve regurgitation is moderate to severe. The aortic valve is tricuspid. There is mild calcification of the aortic valve. There is moderate thickening of the aortic valve. Aortic valve regurgitation is mild. 6. The inferior vena cava is dilated in size with <50% respiratory variability, suggesting right atrial pressure of 15 mmHg.    Discharge Exam: Blood pressure (!) 146/93, pulse 100, temperature 98.2 F (36.8 C), temperature source Oral, resp. rate 18, height 6\' 1"  (1.854 m), weight 61.5 kg, SpO2 100 %. General appearance: alert and cooperative Resp: clear to auscultation bilaterally Cardio: Regular, no murmurs, +rubs GI: soft, non-tender; bowel sounds normal; no masses,  no organomegaly Extremities: extremities normal, atraumatic, no cyanosis or edema  Disposition: Discharge disposition: 01-Home or Self Care       Discharge Instructions     Diet general   Complete by: As directed    Renal diet   Increase activity slowly   Complete by: As directed       Allergies as of 10/09/2021       Reactions  No Known Allergies          Medication List     STOP taking these medications    diltiazem 360 MG 24 hr capsule Commonly known as: CARDIZEM CD       TAKE these medications    amoxicillin 500 MG capsule Commonly known as: AMOXIL Take 1 capsule (500 mg total) by mouth 2 (two) times daily for 3 days.   carvedilol 6.25 MG tablet Commonly known as: COREG Take 1 tablet (6.25 mg total) by mouth 2 (two) times daily with a meal.   hydrALAZINE 25 MG tablet Commonly known as: APRESOLINE Take 1 tablet (25 mg total) by mouth every 8 (eight) hours.   isosorbide mononitrate 30 MG 24 hr tablet Commonly known as: IMDUR Take 1 tablet (30 mg total) by mouth daily.   sevelamer carbonate 800 MG tablet Commonly known as: RENVELA Take 2 tablets by mouth with breakfast, with lunch, and with evening meal.        Follow-up Information     Osei-Bonsu, George, MD Follow up in 1 week(s).   Specialty: Internal Medicine Contact information: [address] [place] [address] [phone]         Lelon Perla, MD Follow up in 2 week(s).   Specialty: Cardiology Contact information: [address] [place] [address] [phone]                35 minutes Signed: Sharen Hones 10/09/2021, 8:39 AM

## 2021-10-09 NOTE — Progress Notes (Signed)
Patient ID: Timothy [name], male   DOB: 02-26-1961, 60 y.o.   MRN: [id number] S: seen and examined bedside this am. Tolerated hd yesterday with net uf 4937cc. He reports that his breathing is back to baseline, he is very adamant about being discharged right now. O:BP (!) 146/93    Pulse 100    Temp 98.2 F (36.8 C) (Oral)    Resp 18    Ht 6\' 1"  (1.854 m)    Wt 61.5 kg    SpO2 100%    BMI 17.88 kg/m   Intake/Output Summary (Last 24 hours) at 10/09/2021 0817 Last data filed at 10/09/2021 0600 Gross per 24 hour  Intake 440 ml  Output 4937 ml  Net -4497 ml   Intake/Output: I/O last 3 completed shifts: In: 440 [P.O.:440] Out: 4937 [[id number]]  Intake/Output this shift:  No intake/output data recorded. Weight change: -2.7 kg Gen: in resp distress HEENT: +JVD CVS:RRR, + friction rub Resp: slightly diminished air entry bibasilar, no w/r/r/c appreciated, normal wob, laying flat, speaking in full sentences Abd:+BS, soft, NT/Nd Ext: no sig edema, LUE AVF +T/B Neuro: awake, alert  Recent Labs  Lab 10/05/21 0804 10/06/21 0930 10/08/21 1127  NA 133* 134* 136  K 3.8 3.8 3.5  CL 93* 93* 98  CO2 26 26 27   GLUCOSE 98 225* 96  BUN 43* 47* 47*  CREATININE 5.64* 5.57* 4.67*  ALBUMIN  --   --  2.6*  CALCIUM 8.5* 8.9 8.7*  PHOS  --   --  3.2   Liver Function Tests: Recent Labs  Lab 10/08/21 1127  ALBUMIN 2.6*   No results for input(s): LIPASE, AMYLASE in the last 168 hours. No results for input(s): AMMONIA in the last 168 hours. CBC: Recent Labs  Lab 10/05/21 0804 10/05/21 2021 10/06/21 0930 10/07/21 0331 10/08/21 1127  WBC 9.1 9.0 14.8* 16.7* 13.6*  NEUTROABS  --   --   --  14.3*  --   HGB 10.8* 10.8* 10.1* 10.2* 9.7*  HCT 33.2* 33.3* 30.8* 32.9* 30.8*  MCV 82.8 81.8 81.9 83.5 84.2  PLT 257 290 274 305 338   Cardiac Enzymes: No results for input(s): CKTOTAL, CKMB, CKMBINDEX, TROPONINI in the last 168 hours. CBG: No results for input(s): GLUCAP in the last 168  hours.  Iron Studies: No results for input(s): IRON, TIBC, TRANSFERRIN, FERRITIN in the last 72 hours. Studies/Results: No results found.  amoxicillin  500 mg Oral BID   carvedilol  6.25 mg Oral BID WC   Chlorhexidine Gluconate Cloth  6 each Topical Q0600   heparin  5,000 Units Subcutaneous Q8H   hydrALAZINE  25 mg Oral Q8H   isosorbide mononitrate  30 mg Oral Daily   sevelamer carbonate  1,600 mg Oral TID with meals   sodium chloride flush  3 mL Intravenous Q12H    BMET    Component Value Date/Time   NA 136 10/08/2021 1127   K 3.5 10/08/2021 1127   CL 98 10/08/2021 1127   CO2 27 10/08/2021 1127   GLUCOSE 96 10/08/2021 1127   BUN 47 (H) 10/08/2021 1127   CREATININE 4.67 (H) 10/08/2021 1127   CREATININE 1.59 (H) 07/29/2015 0943   CALCIUM 8.7 (L) 10/08/2021 1127   CALCIUM 8.4 (L) 09/14/2016 1615   GFRNONAA 14 (L) 10/08/2021 1127   GFRNONAA 48 (L) 07/29/2015 0943   GFRAA 7 (L) 05/18/2019 2220   GFRAA 56 (L) 07/29/2015 0943   CBC    Component Value Date/Time   WBC  13.6 (H) 10/08/2021 1127   RBC 3.66 (L) 10/08/2021 1127   HGB 9.7 (L) 10/08/2021 1127   HCT 30.8 (L) 10/08/2021 1127   PLT 338 10/08/2021 1127   MCV 84.2 10/08/2021 1127   MCH 26.5 10/08/2021 1127   MCHC 31.5 10/08/2021 1127   RDW 20.4 (H) 10/08/2021 1127   LYMPHSABS 0.5 (L) 10/07/2021 0331   MONOABS 1.8 (H) 10/07/2021 0331   EOSABS 0.0 10/07/2021 0331   BASOSABS 0.0 10/07/2021 0331    Dialysis Orders: Center:  [facility]  on TTS . EDW 64.5 kg HD Bath 2K/2Ca  Time 3:45 Heparin none. Access LUE AVF BFR 450 DFR A1.5 Profile 4    Hectoral 1 mcg IV/HD Micera 150 mcg IVP every 2 weeks  Venofer  100 mg IV once per week    Assessment/Plan:  SOB/Congestive heart failure - has history of Grade 1 DD and normal EF by echo on 06/20/2221 but now with left and right heart failure with pericardial friction rub.  CT angio negative for PE.   ECHO negative for tamponade New reduced LV EF by ECHO, Cardiology following. 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  ECHO with small amount of pericardial effusion.  No evidence of tamponade.  CXR with new mild enlargement of cardiopericardial silhouette.  CT scan with small pericardial effusion/thickining increased from prior studies.  ESRD -  normally TTS, has required daily dialysis to offload volume. Would be okay for d/c from a nephrology perspective, was not planning on HD today and tomorrow given improvement in volume status. Would resume TTS schedule  Hypertension/volume  - UF as tolerated  Anemia  - Hgb down to 9.7 12/24. If he were to stay then will check iron panel and assess for fe vs esa needs  Metabolic bone disease -  continue with home meds  Nutrition - renal diet, heart healthy, fluid restrict Discussed w/ primary service.  Gean Quint, MD [facility]

## 2021-10-11 NOTE — TOC Transition Note (Signed)
Transition of care contact from inpatient facility  Date of discharge: 10/09/21 Date of contact: 10/11/21 Method: Attempted Phone Call Spoke to: No Answer  Attempted to contact patient and his wife to discuss transition of care from recent inpatient hospitalization but patient nor his wife picked up the phone. I called both patient's and wife mobile phone. Both voicemail's stated: mailbox is full so unable to leave voicemail. Reviewed outpatient HD center's recent note. Apparently, patient was noted to be lethargic during today's HD. EMS was called, patient perked up, and then refused transfer back to the ED.   Patient will follow up with his outpatient HD unit on: Wednesday 10/12/21 at [facility] as a make-up from today's session.   Tobie Poet, NP

## 2021-10-21 LAB — THC,MS,WB/SP RFX
Cannabidiol: NEGATIVE ng/mL
Cannabinoid Confirmation: POSITIVE
Cannabinol: NEGATIVE ng/mL
Carboxy-THC: 68.9 ng/mL
Hydroxy-THC: 1.6 ng/mL
Tetrahydrocannabinol(THC): 2.3 ng/mL

## 2021-10-21 LAB — DRUG SCREEN 10 W/CONF, SERUM
Amphetamines, IA: NEGATIVE ng/mL
Barbiturates, IA: NEGATIVE ug/mL
Benzodiazepines, IA: NEGATIVE ng/mL
Cocaine & Metabolite, IA: NEGATIVE ng/mL
Methadone, IA: NEGATIVE ng/mL
Opiates, IA: NEGATIVE ng/mL
Oxycodones, IA: NEGATIVE ng/mL
Phencyclidine, IA: NEGATIVE ng/mL
Propoxyphene, IA: NEGATIVE ng/mL
THC(Marijuana) Metabolite, IA: POSITIVE ng/mL — AB

## 2021-10-31 ENCOUNTER — Encounter (HOSPITAL_COMMUNITY): Payer: Self-pay | Admitting: Emergency Medicine

## 2021-10-31 ENCOUNTER — Observation Stay (HOSPITAL_COMMUNITY)
Admission: EM | Admit: 2021-10-31 | Discharge: 2021-11-02 | Disposition: A | Discharge status: Home / Self Care | Payer: Medicare (Managed Care) | Attending: Internal Medicine | Admitting: Internal Medicine

## 2021-10-31 ENCOUNTER — Emergency Department (HOSPITAL_COMMUNITY): Payer: Medicare (Managed Care)

## 2021-10-31 ENCOUNTER — Other Ambulatory Visit: Payer: Self-pay

## 2021-10-31 DIAGNOSIS — J811 Chronic pulmonary edema: Secondary | ICD-10-CM | POA: Diagnosis not present

## 2021-10-31 DIAGNOSIS — N186 End stage renal disease: Secondary | ICD-10-CM

## 2021-10-31 DIAGNOSIS — R7989 Other specified abnormal findings of blood chemistry: Secondary | ICD-10-CM | POA: Diagnosis present

## 2021-10-31 DIAGNOSIS — E8770 Fluid overload, unspecified: Secondary | ICD-10-CM | POA: Insufficient documentation

## 2021-10-31 DIAGNOSIS — Z85038 Personal history of other malignant neoplasm of large intestine: Secondary | ICD-10-CM | POA: Diagnosis not present

## 2021-10-31 DIAGNOSIS — R072 Precordial pain: Secondary | ICD-10-CM

## 2021-10-31 DIAGNOSIS — I5043 Acute on chronic combined systolic (congestive) and diastolic (congestive) heart failure: Secondary | ICD-10-CM | POA: Diagnosis not present

## 2021-10-31 DIAGNOSIS — D631 Anemia in chronic kidney disease: Secondary | ICD-10-CM | POA: Insufficient documentation

## 2021-10-31 DIAGNOSIS — I132 Hypertensive heart and chronic kidney disease with heart failure and with stage 5 chronic kidney disease, or end stage renal disease: Secondary | ICD-10-CM | POA: Insufficient documentation

## 2021-10-31 DIAGNOSIS — I5042 Chronic combined systolic (congestive) and diastolic (congestive) heart failure: Secondary | ICD-10-CM | POA: Diagnosis present

## 2021-10-31 DIAGNOSIS — E871 Hypo-osmolality and hyponatremia: Secondary | ICD-10-CM | POA: Diagnosis not present

## 2021-10-31 DIAGNOSIS — I10 Essential (primary) hypertension: Secondary | ICD-10-CM | POA: Diagnosis not present

## 2021-10-31 DIAGNOSIS — Z992 Dependence on renal dialysis: Secondary | ICD-10-CM | POA: Diagnosis not present

## 2021-10-31 DIAGNOSIS — F17210 Nicotine dependence, cigarettes, uncomplicated: Secondary | ICD-10-CM | POA: Diagnosis not present

## 2021-10-31 DIAGNOSIS — Z20822 Contact with and (suspected) exposure to covid-19: Secondary | ICD-10-CM | POA: Insufficient documentation

## 2021-10-31 DIAGNOSIS — R0602 Shortness of breath: Secondary | ICD-10-CM | POA: Diagnosis not present

## 2021-10-31 DIAGNOSIS — F1910 Other psychoactive substance abuse, uncomplicated: Secondary | ICD-10-CM

## 2021-10-31 DIAGNOSIS — E875 Hyperkalemia: Secondary | ICD-10-CM | POA: Diagnosis not present

## 2021-10-31 DIAGNOSIS — R778 Other specified abnormalities of plasma proteins: Secondary | ICD-10-CM | POA: Diagnosis present

## 2021-10-31 LAB — TROPONIN I (HIGH SENSITIVITY)
Troponin I (High Sensitivity): 84 ng/L — ABNORMAL HIGH (ref <18)
Troponin I (High Sensitivity): 81 ng/L — ABNORMAL HIGH (ref <18)

## 2021-10-31 LAB — RESP PANEL BY RT-PCR (FLU A&B, COVID) ARPGX2
Influenza A by PCR: NEGATIVE
Influenza B by PCR: NEGATIVE
SARS Coronavirus 2 by RT PCR: NEGATIVE

## 2021-10-31 LAB — CBC
HCT: 31.6 % — ABNORMAL LOW (ref 39.0–52.0)
Hemoglobin: 10.2 g/dL — ABNORMAL LOW (ref 13.0–17.0)
MCH: 26.4 pg (ref 26.0–34.0)
MCHC: 32.3 g/dL (ref 30.0–36.0)
MCV: 81.9 fL (ref 80.0–100.0)
Platelets: 451 10*3/uL — ABNORMAL HIGH (ref 150–400)
RBC: 3.86 MIL/uL — ABNORMAL LOW (ref 4.22–5.81)
RDW: 20.2 % — ABNORMAL HIGH (ref 11.5–15.5)
WBC: 7.5 10*3/uL (ref 4.0–10.5)
nRBC: 0 % (ref 0.0–0.2)

## 2021-10-31 LAB — BASIC METABOLIC PANEL
Anion gap: 19 — ABNORMAL HIGH (ref 5–15)
BUN: 48 mg/dL — ABNORMAL HIGH (ref 6–20)
CO2: 25 mmol/L (ref 22–32)
Calcium: 8.9 mg/dL (ref 8.9–10.3)
Chloride: 87 mmol/L — ABNORMAL LOW (ref 98–111)
Creatinine, Ser: 6.92 mg/dL — ABNORMAL HIGH (ref 0.61–1.24)
GFR, Estimated: 8 mL/min — ABNORMAL LOW (ref >60)
Glucose, Bld: 87 mg/dL (ref 70–99)
Potassium: 4.8 mmol/L (ref 3.5–5.1)
Sodium: 131 mmol/L — ABNORMAL LOW (ref 135–145)

## 2021-10-31 MED ORDER — ACETAMINOPHEN 325 MG PO TABS
650.0000 mg | ORAL_TABLET | Freq: Four times a day (QID) | ORAL | Status: DC | PRN
  Administered 2021-10-31: 650 mg via ORAL
  Filled 2021-10-31: qty 2

## 2021-10-31 MED ORDER — OXYCODONE-ACETAMINOPHEN 5-325 MG PO TABS
2.0000 | ORAL_TABLET | Freq: Four times a day (QID) | ORAL | Status: DC | PRN
Start: 2021-10-31 — End: 2021-11-02
  Administered 2021-11-01 – 2021-11-02 (×5): 2 via ORAL
  Filled 2021-10-31 (×5): qty 2

## 2021-10-31 MED ORDER — CARVEDILOL 3.125 MG PO TABS
3.1250 mg | ORAL_TABLET | Freq: Two times a day (BID) | ORAL | Status: DC
  Administered 2021-11-01 – 2021-11-02 (×3): 3.125 mg via ORAL
  Filled 2021-10-31 (×3): qty 1

## 2021-10-31 MED ORDER — HYDRALAZINE HCL 20 MG/ML IJ SOLN: 10.0000 mg | INTRAMUSCULAR | Status: DC | PRN

## 2021-10-31 MED ORDER — SEVELAMER CARBONATE 800 MG PO TABS
1600.0000 mg | ORAL_TABLET | Freq: Three times a day (TID) | ORAL | Status: DC
  Administered 2021-11-01 – 2021-11-02 (×3): 1600 mg via ORAL
  Filled 2021-10-31 (×3): qty 2

## 2021-10-31 MED ORDER — ACETAMINOPHEN 650 MG RE SUPP: 650.0000 mg | Freq: Four times a day (QID) | RECTAL | Status: DC | PRN

## 2021-10-31 NOTE — H&P (Signed)
History and Physical    PLEASE NOTE THAT DRAGON DICTATION SOFTWARE WAS USED IN THE CONSTRUCTION OF THIS NOTE.   Timothy [name] QVZ:[id number] DOB: [birth year] DOA: 10/31/2021  PCP: [name], MD  Patient coming from: home   I have personally briefly reviewed patient's old medical records in [place]  Chief Complaint: Shortness of breath  HPI: Timothy [name] is a 61 y.o. male with medical history significant for chronic systolic/diastolic heart failure, end-stage renal disease on hemodialysis, chronically elevated troponin, anemia of chronic kidney disease, with baseline hemoglobin 10-11, hypertension, who is admitted to [facility] on 10/31/2021 with acute on chronic systolic/diastolic heart failure after presenting from home to [facility] ED complaining of shortness of breath.   The patient presents with 2 to 3 days of shortness of breath associate with orthopnea, PND, and worsening edema in the bilateral lower extremity.  He denies any associated exertional chest pain, also denying any associated palpitations, nausea, vomiting, presyncope, or syncope.  He reports mild, new onset nonproductive cough in the absence of any wheezing, mopped assist, new lower extremity erythema, or calf tenderness.  Denies any recent trauma or travel.  He also denies any recent subjective fever, chills, rigors, or generalized myalgias.  No recent rhinitis, rhinorrhea, sore throat.  No abdominal pain, diarrhea, or rash.   Per chart review, he has a history of chronic systolic/diastolic heart failure, most recent echocardiogram performed on 10/05/2021 notable for LVEF 40 to 45%, global hypokinesis, moderate concentric LVH, indeterminate diastolic parameters.  This is relative to preceding echocardiogram in September 2022 which was notable for grade 1 diastolic dysfunction.  He confirms an history of end-stage renal disease on hemodialysis, and also confirms that he is getting uric at baseline.  He  reports outstanding compliance and attending his scheduled hemodialysis sessions, although he notes that recently, his hemodialysis sessions have needed to be aborted short of the anticipated timeframe, as he repeatedly develops hypotension over the course of the dialysis session.   He notes very similar recent presentation resulting in hospitalization at [facility] from 10/05/2021 to 10/09/2021 for acute on chronic systolic/diastolic heart failure in the setting of inability to tolerate full hemodialysis session as a consequence of hypotensive ramifications in the scheduled HD sessions over the week preceding this presentation.  Degree of nature of ensuing reduction in home and upper tensive medications to attend to this recurrent development of hypotension during hemodialysis is currently unclear to me.  Of note, the patient has a documented history of chronically elevated troponin, with high-sensitivity troponin range over the course of the last 6 months noted to be 80-200, with most recent prior high-sensitivity troponin I data point found to be 156 on 10/05/2021.      ED Course:  Vital signs in the ED were notable for the following: Afebrile; heart rate 95-1 01; blood pressure 132/93 - 150/109; respiratory rate 14-19, oxygen saturation 9600% on room air.  Labs were notable for the following: BMP notable for the following: Sodium 131, potassium 4.8, chloride 87, carbon 25.  Initial high-sensitivity troponin I 84, with repeat value trending down to 81.  CBC notable for white blood cell count 7500, hemoglobin 10.2 compared to 9.7 on 10/08/2021.  COVID-19/fluids PCR were found to be negative.  Imaging and additional notable ED work-up: EKG showed sinus rhythm with heart rate 97, normal intervals, nonspecific to inversion in V6, no evidence of ST changes, including no evidence of ST elevation.  Two-view chest x-ray showed cardiomegaly, and per  radiology read, showed persistent pulmonary edema,  although with some degree of improvement relative to most recent prior chest x-ray was performed on 10/05/2021, with a sending chest x-ray showing no evidence of infiltrate, effusion, or pneumothorax.  While in the ED, the following were administered: (none).   The EDP discussed the patient's case/imaging with the on-call nephrologist, Dr. Inetta Fermo, Who recommended admission to the hospitalist for further evaluation and management of acute volume overload, with plan for formal nephrology consultation as well as plan for initiation of serial hemodialysis sessions starting on the morning of 11/01/2021, noting he lack of availability of overnight hemodialysis personnel in order to initiate this process in the interval.  Subsequently, the patient was admitted for overnight observation to the PCU for further evaluation management of acute on chronic systolic/diastolic heart failure, including need for serial hemodialysis, per associated nephrology consultation.     Review of Systems: As per HPI otherwise 10 point review of systems negative.   Past Medical History:  Diagnosis Date   Anemia of chronic kidney failure    BPH (benign prostatic hyperplasia)    Colon cancer ([place]) 2014   End stage renal disease on dialysis ([place]) 2017   Hypertension    Hypertensive heart disease with chronic diastolic congestive heart failure ([place]) 07/17/2016   Polysubstance abuse ([place])    History of heroin and marijuana use    Past Surgical History:  Procedure Laterality Date   ABDOMINAL SURGERY     AV FISTULA PLACEMENT Left 09/19/2016   Procedure: Left arm Radiocephalic ARTERIOVENOUS (AV) FISTULA CREATION;  Surgeon: Conrad Elmo, MD;  Location: [place];  Service: Vascular;  Laterality: Left;   BASCILIC VEIN TRANSPOSITION Left 07/09/2017   Procedure: BRACHIOCEPHALIC FISTULA CREATION;  Surgeon: Conrad Atascadero, MD;  Location: [place];  Service: Vascular;  Laterality: Left;   COLON SURGERY  2014   INSERTION OF DIALYSIS  CATHETER N/A 09/19/2016   Procedure: INSERTION OF TUNNELED DIALYSIS CATHETER;  Surgeon: Conrad Elwood, MD;  Location: [place];  Service: Vascular;  Laterality: N/A;   IR GENERIC HISTORICAL  09/14/2016   IR US GUIDE VASC ACCESS RIGHT 09/14/2016 [name], DO [facility]INTER[facility]   IR GENERIC HISTORICAL  09/14/2016   IR FLUORO GUIDE CV LINE RIGHT 09/14/2016 [name], DO [facility]   IR PARACENTESIS  06/22/2021   LAPAROTOMY N/A 05/23/2021   Procedure: EXPLORATORY LAPAROTOMY LYSIS ADHESIONS;  Surgeon: [name], MD;  Location: [place];  Service: General;  Laterality: N/A;   ORIF TIBIA PLATEAU Left 01/15/2018   Procedure: OPEN REDUCTION INTERNAL FIXATION (ORIF) TIBIAL PLATEAU;  Surgeon: Altamese Brush, MD;  Location: [place];  Service: Orthopedics;  Laterality: Left;   TRANSTHORACIC ECHOCARDIOGRAM  07/2016    EF 60-65%, No RWMA. Mod Concentric LVH - Gr 2 DD. Severe LA dilation. PAP ~35 mmHg (mild Pulm HTN)  --> no changes noted 1 month later    Social History:  reports that he has been smoking cigarettes. He has been smoking an average of .25 packs per day. He has never used smokeless tobacco. He reports current drug use. Frequency: 1.00 time per week. Drugs: Marijuana and Heroin. He reports that he does not drink alcohol.   No Known Allergies  Family History  Problem Relation Age of Onset   Heart failure Mother        Died at age 76.   Heart attack Mother 58   Hypertension Mother    Diabetes Mellitus II Mother    Other Father  Unknown   Kidney failure Sister        (Oldest Sister)   Other Other        Multiple siblings have started her heart disease, he is not sure of the details.   CAD Nephew     Family history reviewed and not pertinent    Prior to Admission medications   Medication Sig Start Date End Date Taking? Authorizing Provider  carvedilol (COREG) 6.25 MG tablet Take 1 tablet (6.25 mg total) by mouth 2 (two) times daily with a meal. 10/09/21   Sharen Hones, MD   hydrALAZINE (APRESOLINE) 25 MG tablet Take 1 tablet (25 mg total) by mouth every 8 (eight) hours. 10/09/21   Sharen Hones, MD  isosorbide mononitrate (IMDUR) 30 MG 24 hr tablet Take 1 tablet (30 mg total) by mouth daily. 10/09/21   Sharen Hones, MD  sevelamer carbonate (RENVELA) 800 MG tablet Take 2 tablets by mouth with breakfast, with lunch, and with evening meal. 05/12/21   [provider]     Objective    Physical Exam: Vitals:   10/31/21 1915 10/31/21 1945 10/31/21 2000 10/31/21 2015  BP: (!) 144/98 (!) 155/112 (!) 151/118 (!) 150/109  Pulse: 99 98 96 99  Resp: 18 17 14 14   Temp:      TempSrc:      SpO2: 98% 97% 96% 98%    General: appears to be stated age; alert, oriented; mildly increased work of breathing noted Skin: warm, dry, no rash Head:  AT/[place] Mouth:  Oral mucosa membranes appear moist, normal dentition Neck: supple; trachea midline Heart:  RRR; did not appreciate any M/R/G Lungs: Bibasilar crackles, but otherwise CTAB, did not appreciate any wheezes,or rhonchi Abdomen: + BS; soft, ND, NT Vascular: 2+ pedal pulses b/l; 2+ radial pulses b/l Extremities: Trace edema in the bilateral lower extremities; no muscle wasting Neuro: strength and sensation intact in upper and lower extremities b/l   Labs on Admission: I have personally reviewed following labs and imaging studies  CBC: Recent Labs  Lab 10/31/21 1459  WBC 7.5  HGB 10.2*  HCT 31.6*  MCV 81.9  PLT 867*   Basic Metabolic Panel: Recent Labs  Lab 10/31/21 1459  NA 131*  K 4.8  CL 87*  CO2 25  GLUCOSE 87  BUN 48*  CREATININE 6.92*  CALCIUM 8.9   GFR: CrCl cannot be calculated (Unknown ideal weight.). Liver Function Tests: No results for input(s): AST, ALT, ALKPHOS, BILITOT, PROT, ALBUMIN in the last 168 hours. No results for input(s): LIPASE, AMYLASE in the last 168 hours. No results for input(s): AMMONIA in the last 168 hours. Coagulation Profile: No results for input(s): INR,  PROTIME in the last 168 hours. Cardiac Enzymes: No results for input(s): CKTOTAL, CKMB, CKMBINDEX, TROPONINI in the last 168 hours. BNP (last 3 results) No results for input(s): PROBNP in the last 8760 hours. HbA1C: No results for input(s): HGBA1C in the last 72 hours. CBG: No results for input(s): GLUCAP in the last 168 hours. Lipid Profile: No results for input(s): CHOL, HDL, LDLCALC, TRIG, CHOLHDL, LDLDIRECT in the last 72 hours. Thyroid Function Tests: No results for input(s): TSH, T4TOTAL, FREET4, T3FREE, THYROIDAB in the last 72 hours. Anemia Panel: No results for input(s): VITAMINB12, FOLATE, FERRITIN, TIBC, IRON, RETICCTPCT in the last 72 hours. Urine analysis:    Component Value Date/Time   COLORURINE YELLOW 05/19/2021 2015   APPEARANCEUR HAZY (A) 05/19/2021 2015   LABSPEC 1.012 05/19/2021 2015   PHURINE 9.0 (H) 05/19/2021  2015   GLUCOSEU NEGATIVE 05/19/2021 2015   HGBUR NEGATIVE 05/19/2021 2015   [place] 05/19/2021 2015   [place] 05/19/2021 2015   PROTEINUR >=300 (A) 05/19/2021 2015   NITRITE NEGATIVE 05/19/2021 2015   LEUKOCYTESUR SMALL (A) 05/19/2021 2015    Radiological Exams on Admission: DG Chest 2 View  Result Date: 10/31/2021 CLINICAL DATA:  Chest pain EXAM: CHEST - 2 VIEW COMPARISON:  10/05/2021 FINDINGS: Stable enlarged cardiac silhouette. Interval improvement in pulmonary edema pattern seen on comparison exam. Probable LEFT basilar atelectasis. No pneumothorax pulmonary edema. IMPRESSION: 1. Improvement in pulmonary edema pattern seen on comparison exam. 2. Persistent marked cardiomegaly. Electronically Signed   By: Suzy Bouchard M.D.   On: 10/31/2021 15:37     EKG: Independently reviewed, with result as described above.    Assessment/Plan   Principal Problem:   Acute on chronic combined systolic and diastolic CHF (congestive heart failure) (HCC) Active Problems:   HTN (hypertension)   Elevated troponin   ESRD on  hemodialysis (HCC)   Volume overload   SOB (shortness of breath)   Acute hyponatremia     #) Acute on chronic systolic/diastolic heart failure: dx of acute decompensation on the basis of presenting to 3 days of progressive shortness of breath associate with orthopnea, PND, worsening of edema in the bilateral lower extremities, with chest x-ray showing evidence of pulmonary edema, as further detailed above. This is in the context of a known history of chronic systolic/diastolic heart failure, with most recent echocardiogram performed in December 2022, noting LVEF 40 to 45%, with additional details as further reviewed above. Etiology leading to presenting acutely decompensated heart failure appears to be on the basis of recurrent incomplete hemodialysis sessions over the last few weeks as a consequence of repeated development of hypotension during the process of hemodialysis itself, which also prompted recent hospitalization in December 2022 for similar presentation involving acute volume overload, which improved via ensuing serial HD sessions.  Of note, in setting of end-stage renal disease, the patient confirms that he is anuric at baseline.   Overall, ACS leading to presenting acutely decompensated heart failure appears less likely at this time in the absence of any associated exertional/typical CP, presenting EKG showing no evidence of acute ischemic changes, as well as troponin that is on the low end of his baseline chronically elevated range, and also noted to be lower than most recent prior, with repeat value performed in the ED today showing a further trend down, which is particular reassuring given interval reduction and associated renal clearance given recent incomplete HD sessions, as above.  Case discussed with the on-call nephrologist, Dr. Inetta Fermo, Who will formally consult, and plans for initiation of serial HD sessions starting in the morning, as further described above.  In the context of  the patient's compliance with home beta-blocker use, which appears to be in the form of Coreg 6.25 mg p.o. twice daily, will continue beta-blocker, but at reduced dose of 3.125 mg p.o. twice daily, which may also assist with reducing risk for impending development of hypotension during ensuing HD sessions.  Otherwise, to reduce risks for development of hypotension during impending HD sessions, that are planned for a serial basis during tomorrow morning, will hold additional home antihypertensive medications for now, we will closely monitoring ensuing blood pressure, including that during aforementioned serial HD sessions.    Plan: monitor strict I's & O's and daily weights. Monitor on telemetry. Monitor continuous pulse oximetry.  Repeat CMP in the morning.  Nephrology consulted, with plan for serial HD sessions during the morning.  Reduction of home Coreg, as above.  Holding home hydralazine and Imdur, as above.  Check BNP.        #) Acute hypo-osmolar hypervolemic hyponatremia: Presenting serum sodium noted to be 131, relative to most recent prior value 136 on 10/08/2021.  Appears to be hypervolemic in nature in the setting of concomitant acute on chronic systolic/diastolic heart failure due to recent incomplete hemodialysis sessions, as above.  There is also the potential for a secondary element of SIADH given pulmonary ramifications of this acute volume overload picture.  Patient is anuric, so we will refrain from ordering urine electrolytes as well as urinary osmolality as component of evaluation.    Plan: monitor strict I's and O's and daily weights. Check serum osmolality to confirm suspected hypoosmolar etiology.  Repeat CMP in the morning. Check TSH.  Nephrology formally consulted, with plan for serial HD sessions to begin in the morning.      #) ESRD: on hemodialysis.  Patient medical management includes Renvela.   Plan: Nephrology consulted, plan for serial HD sessions starting in  the morning.      #) Anemia chronic kidney disease, document history of such, with baseline hemoglobin 10-11, presenting labs reflecting hemoglobin consistent with this range.  No evidence of active bleed at this time.  Plan: Repeat CBC in the morning.      #) Chronically elevated troponin: Documented history of such, with high-sensitivity troponin range over the course of the last 6 months noted to be 80-200, with most recent prior high-sensitivity troponin I data point found to be 156 on 10/05/2021.  Presenting high-sensitivity troponin value noted to be in the low end of this chronically elevated range, and trending down, with relative to most recent prior value from December as well as per repeat troponin value obtained during today's ED course.  Presenting ACS felt to be unlikely given absence of exertional/typical chest pain, with EKG showing evidence of acute ischemic changes, including no evidence of ST elevation.  There does not appear to be any clinical indication for further trending of troponin values at this time.   Plan: Repeat CMP in the morning.  Monitor on telemetry.        #) Secondary hypertension: In the setting of end-stage renal disease; appears complicated by recurrent development of hypotension during hemodialysis sessions, advising failure to complete hemodialysis sessions on a recurrent basis over the last several weeks, resulting in multiple hospitalizations for acute on chronic systolic/diastolic heart failure, as further detailed above.  Patient in emergency regimen appears consistent with Coreg and hydralazine, and Imdur.  Will reduce dose of Coreg, as further quantified above, holding home hydralazine and Imdur, and closely monitoring single pressure trend, including that during impending plan for HD session starting tomorrow morning.  Plan: Reduced dose of Coreg, as above.  Holding additional hematemesis, as above.  As needed IV hydralazine ordered.  Monitor  on symmetry.      DVT prophylaxis: SCD's   Code Status: Full code Family Communication: none Disposition Plan: Per Rounding Team Consults called: Dr. Inetta Fermo of nephrology, formally consulted, as further detailed above;  Admission status: Observation; PCU   PLEASE NOTE THAT DRAGON DICTATION SOFTWARE WAS USED IN THE CONSTRUCTION OF THIS NOTE.   [place] DO [facility]  From [place]   10/31/2021, 10:46 PM

## 2021-10-31 NOTE — ED Provider Triage Note (Signed)
Emergency Medicine Provider Triage Evaluation Note  Timothy [name] , a 61 y.o. male  was evaluated in triage.  Pt complains of chest pain onset today.  He is a dialysis patient and receives dialysis on Tuesday, Thursday, Saturday.  His last dialysis treatment was on Saturday. Has associated shortness of breath. Has not tried any medications for his symptoms.  Denies abdominal pain, nausea, vomiting, fever, chills.    Review of Systems  Positive: As per HPI above Negative: Abdominal pain, nausea, vomiting  Physical Exam  BP (!) 135/111    Pulse 99    Temp 97.9 F (36.6 C) (Oral)    Resp 19    SpO2 96%  Gen:   Awake, no distress   Resp:  Normal effort  MSK:   Moves extremities without difficulty  Other:  No chest wall tenderness palpation.  No abdominal tenderness to palpation.  Medical Decision Making  Medically screening exam initiated at 3:05 PM.  Appropriate orders placed.  Timothy [name] was informed that the remainder of the evaluation will be completed by another provider, this initial triage assessment does not replace that evaluation, and the importance of remaining in the ED until their evaluation is complete.    Timothy [name] A, PA-C 10/31/21 1513

## 2021-10-31 NOTE — ED Notes (Signed)
Pt stands to ambulate with pulse ox. Takes several steps in hall. Unsteady on feet, assisted x2, O2 drops to 94% on RA. Coughs and congested sounding. Pt reports feeling tired and weak.

## 2021-10-31 NOTE — ED Provider Notes (Signed)
Timothy [name] Arrival date & time: 10/31/21  1438     History  Chief Complaint  Patient presents with   Chest Pain    Timothy [name] is a 61 y.o. male.  The history is provided by the patient and medical records. No language interpreter was used.  Chest Pain Pain location:  Substernal area, L chest and R chest Pain quality: tightness   Pain radiates to:  Does not radiate Pain severity:  Moderate Onset quality:  Gradual Duration:  2 days Timing:  Constant Progression:  Waxing and waning Chronicity:  Recurrent Context: breathing   Relieved by:  Nothing Worsened by:  Exertion Ineffective treatments:  None tried Associated symptoms: cough, fatigue and shortness of breath   Associated symptoms: no abdominal pain, no altered mental status, no anxiety, no back pain, no diaphoresis, no fever, no headache, no lower extremity edema (not as bad this time), no nausea, no near-syncope, no numbness, no palpitations, no vomiting and no weakness       Home Medications Prior to Admission medications   Medication Sig Start Date End Date Taking? Authorizing Provider  carvedilol (COREG) 6.25 MG tablet Take 1 tablet (6.25 mg total) by mouth 2 (two) times daily with a meal. 10/09/21   Sharen Hones, MD  hydrALAZINE (APRESOLINE) 25 MG tablet Take 1 tablet (25 mg total) by mouth every 8 (eight) hours. 10/09/21   Sharen Hones, MD  isosorbide mononitrate (IMDUR) 30 MG 24 hr tablet Take 1 tablet (30 mg total) by mouth daily. 10/09/21   Sharen Hones, MD  sevelamer carbonate (RENVELA) 800 MG tablet Take 2 tablets by mouth with breakfast, with lunch, and with evening meal. 05/12/21   [provider]      Allergies    Patient has no known allergies.    Review of Systems   Review of Systems  Constitutional:  Positive for fatigue. Negative for chills, diaphoresis and fever.  HENT:  Negative for congestion.   Respiratory:   Positive for cough, chest tightness and shortness of breath. Negative for wheezing.   Cardiovascular:  Positive for chest pain. Negative for palpitations and near-syncope.  Gastrointestinal:  Negative for abdominal pain, constipation, diarrhea, nausea and vomiting.  Genitourinary:  Negative for flank pain.       Does not make urine  Musculoskeletal:  Negative for back pain and neck pain.  Skin:  Negative for rash and wound.  Neurological:  Negative for weakness, light-headedness, numbness and headaches.  Psychiatric/Behavioral:  Negative for agitation and confusion.   All other systems reviewed and are negative.  Physical Exam Updated Vital Signs BP (!) 157/109 (BP Location: Left Arm)    Pulse 97    Temp 98.8 F (37.1 C) (Oral)    Resp 16    SpO2 100%  Physical Exam Vitals and nursing note reviewed.  Constitutional:      General: He is not in acute distress.    Appearance: He is well-developed. He is not ill-appearing, toxic-appearing or diaphoretic.  HENT:     Head: Normocephalic and atraumatic.  Eyes:     Conjunctiva/sclera: Conjunctivae normal.  Cardiovascular:     Rate and Rhythm: Normal rate and regular rhythm.     Heart sounds: Murmur heard.  Pulmonary:     Effort: Pulmonary effort is normal. No respiratory distress.     Breath sounds: Rales present. No decreased breath sounds or wheezing.  Chest:     Chest wall: No  tenderness.  Abdominal:     Palpations: Abdomen is soft.     Tenderness: There is no abdominal tenderness.  Musculoskeletal:        General: No swelling.     Cervical back: Neck supple.     Right lower leg: No tenderness. No edema.     Left lower leg: No tenderness. No edema.  Skin:    General: Skin is warm and dry.     Capillary Refill: Capillary refill takes less than 2 seconds.  Neurological:     Mental Status: He is alert.  Psychiatric:        Mood and Affect: Mood normal.    ED Results / Procedures / Treatments   Labs (all labs ordered are  listed, but only abnormal results are displayed) Labs Reviewed  BASIC METABOLIC PANEL - Abnormal; Notable for the following components:      Result Value   Sodium 131 (*)    Chloride 87 (*)    BUN 48 (*)    Creatinine, Ser 6.92 (*)    GFR, Estimated 8 (*)    Anion gap 19 (*)    All other components within normal limits  CBC - Abnormal; Notable for the following components:   RBC 3.86 (*)    Hemoglobin 10.2 (*)    HCT 31.6 (*)    RDW 20.2 (*)    Platelets 451 (*)    All other components within normal limits  TROPONIN I (HIGH SENSITIVITY) - Abnormal; Notable for the following components:   Troponin I (High Sensitivity) 84 (*)    All other components within normal limits  TROPONIN I (HIGH SENSITIVITY) - Abnormal; Notable for the following components:   Troponin I (High Sensitivity) 81 (*)    All other components within normal limits  RESP PANEL BY RT-PCR (FLU A&B, COVID) ARPGX2  PHOSPHORUS  MAGNESIUM  COMPREHENSIVE METABOLIC PANEL  CBC WITH DIFFERENTIAL/PLATELET  BRAIN NATRIURETIC PEPTIDE  OSMOLALITY  TSH    EKG EKG Interpretation  Date/Time:  Monday October 31 2021 15:13:50 EST Ventricular Rate:  97 PR Interval:  174 QRS Duration: 84 QT Interval:  358 QTC Calculation: 454 R Axis:   51 Text Interpretation: Normal sinus rhythm ST & T wave abnormality, consider lateral ischemia Abnormal ECG When compared with ECG of 05-Oct-2021 08:00, PREVIOUS ECG IS PRESENT When compared to prior, less wandering baseline. NO STEMI Confirmed by Antony Blackbird [phone]) on 10/31/2021 6:19:42 PM  Radiology DG Chest 2 View  Result Date: 10/31/2021 CLINICAL DATA:  Chest pain EXAM: CHEST - 2 VIEW COMPARISON:  10/05/2021 FINDINGS: Stable enlarged cardiac silhouette. Interval improvement in pulmonary edema pattern seen on comparison exam. Probable LEFT basilar atelectasis. No pneumothorax pulmonary edema. IMPRESSION: 1. Improvement in pulmonary edema pattern seen on comparison exam. 2. Persistent  marked cardiomegaly. Electronically Signed   By: Suzy Bouchard M.D.   On: 10/31/2021 15:37    Procedures Procedures    Medications Ordered in ED Medications  acetaminophen (TYLENOL) tablet 650 mg (650 mg Oral Given 10/31/21 2305)    Or  acetaminophen (TYLENOL) suppository 650 mg ( Rectal See Alternative 10/31/21 2305)  sevelamer carbonate (RENVELA) tablet 1,600 mg (has no administration in time range)  carvedilol (COREG) tablet 3.125 mg (has no administration in time range)  hydrALAZINE (APRESOLINE) injection 10 mg (has no administration in time range)  oxyCODONE-acetaminophen (PERCOCET/ROXICET) 5-325 MG per tablet 2 tablet (2 tablets Oral Given 11/01/21 0009)    ED Course/ Medical Decision Making/ A&P  Medical Decision Making  Jamail Cullers is a 61 y.o. male with a past medical history significant for hypertension, ESRD on dialysis TTS, previous polysubstance abuse, thoracic aneurysm, previous bowel obstruction, previous colon cancer, and pericardial effusion without tamponade who presents with worsening shortness of breath and chest pain.  According to patient, he feels "the same" as last month before Christmas when he had to be admitted for dialysis for the same symptoms.  He says that despite taking his dialysis as directed and not missing any treatments, he is getting fluid overloaded.  He says that the treatment center has not been able to get as much fluid off as they want and he feels that he is 11 pounds up compared to what he should be.  He reports that for the last 2 days he has been having worsened chest tightness and chest discomfort and shortness of breath whenever he tries to exert himself.  He denies any fevers or chills but does report he had a dry cough.  He denies nausea, vomiting, constipation, or diarrhea.  He does not make any urine still.  He does not take oxygen at home.  Patient had waited just over 4 hours before my initial evaluation in  the waiting room.  Patient had some work-up started in triage.  Patient's troponin elevated at 84 however this is actually down compared to prior.  Will trend.  CBC shows similar anemia prior with no leukocytosis.  Metabolic panel shows elevated creatinine.  He had chest x-ray which showed some improved edema compared to prior but still has cardiomegaly.  EKG showed no STEMI.  Clinically I believe his presentation is very similar to last time when he was having symptoms of shortness breath and chest pain when he was fluid overload despite taking all of his appropriate treatments.  Previously the plan was to admit for serial hemodialysis to get his fluid off and I think that may need to happen again tonight.  We will get a second troponin and a COVID swab and we will try to ambulate patient with pulse oximetry.  His oxygen saturation was 91% on room air while we were discussing and he was still resting.  When work-up is completed, we will touch base with nephrology and anticipate admission.  8:49 PM COVID and flu test negative.  Patient was ambulated and maintain oxygen saturations in the low 90s but he was coughing and having some shortness of breath still.  As his symptoms are remarkably similar to his last admission needing serial hemodialysis, will call nephrology and discussed.  Anticipate he may need dialysis and admission.  8:55 PM Just spoke to Dr. Marval Regal with nephrology who was the nephrologist who saw the patient during his recent admission and agrees the patient needs admission to medicine to get dialysis in the morning.  He feels he may need serial dialysis again to help get the fluid off and help with his chest pain and symptoms.  Will call medicine for admission.         Final Clinical Impression(s) / ED Diagnoses Final diagnoses:  Precordial pain  Exertional shortness of breath     Clinical Impression: 1. Precordial pain   2. Exertional shortness of breath      Disposition: Admit  This note was prepared with assistance of Dragon voice recognition software. Occasional wrong-word or sound-a-like substitutions may have occurred due to the inherent limitations of voice recognition software.      Rasheeda Mulvehill, Gwenyth Allegra, MD 11/01/21 [phone]

## 2021-10-31 NOTE — ED Triage Notes (Signed)
Patient BIB [facility] from home for chest pressure, patient states his dialysis center has been unable to remove the full amount of fluid he needs taken off during his last few treatments. Patient alert, oriented, and in no apparent distress at this time.  EMS Vitals BP 164/102 HR 100 CBG 112

## 2021-11-01 DIAGNOSIS — D631 Anemia in chronic kidney disease: Secondary | ICD-10-CM

## 2021-11-01 DIAGNOSIS — I5043 Acute on chronic combined systolic (congestive) and diastolic (congestive) heart failure: Secondary | ICD-10-CM | POA: Diagnosis not present

## 2021-11-01 DIAGNOSIS — N186 End stage renal disease: Secondary | ICD-10-CM

## 2021-11-01 DIAGNOSIS — Z992 Dependence on renal dialysis: Secondary | ICD-10-CM

## 2021-11-01 DIAGNOSIS — E875 Hyperkalemia: Secondary | ICD-10-CM | POA: Diagnosis present

## 2021-11-01 DIAGNOSIS — E871 Hypo-osmolality and hyponatremia: Secondary | ICD-10-CM | POA: Diagnosis not present

## 2021-11-01 DIAGNOSIS — R778 Other specified abnormalities of plasma proteins: Secondary | ICD-10-CM | POA: Diagnosis not present

## 2021-11-01 DIAGNOSIS — J811 Chronic pulmonary edema: Secondary | ICD-10-CM | POA: Diagnosis not present

## 2021-11-01 LAB — CBC WITH DIFFERENTIAL/PLATELET
Abs Immature Granulocytes: 0.04 10*3/uL (ref 0.00–0.07)
Basophils Absolute: 0 10*3/uL (ref 0.0–0.1)
Basophils Relative: 0 %
Eosinophils Absolute: 0 10*3/uL (ref 0.0–0.5)
Eosinophils Relative: 0 %
HCT: 29.9 % — ABNORMAL LOW (ref 39.0–52.0)
Hemoglobin: 9.8 g/dL — ABNORMAL LOW (ref 13.0–17.0)
Immature Granulocytes: 1 %
Lymphocytes Relative: 13 %
Lymphs Abs: 1 10*3/uL (ref 0.7–4.0)
MCH: 26.7 pg (ref 26.0–34.0)
MCHC: 32.8 g/dL (ref 30.0–36.0)
MCV: 81.5 fL (ref 80.0–100.0)
Monocytes Absolute: 1.4 10*3/uL — ABNORMAL HIGH (ref 0.1–1.0)
Monocytes Relative: 19 %
Neutro Abs: 4.9 10*3/uL (ref 1.7–7.7)
Neutrophils Relative %: 67 %
Platelets: 460 10*3/uL — ABNORMAL HIGH (ref 150–400)
RBC: 3.67 MIL/uL — ABNORMAL LOW (ref 4.22–5.81)
RDW: 20.3 % — ABNORMAL HIGH (ref 11.5–15.5)
WBC: 7.4 10*3/uL (ref 4.0–10.5)
nRBC: 0 % (ref 0.0–0.2)

## 2021-11-01 LAB — COMPREHENSIVE METABOLIC PANEL
ALT: 15 U/L (ref 0–44)
AST: 24 U/L (ref 15–41)
Albumin: 2.7 g/dL — ABNORMAL LOW (ref 3.5–5.0)
Alkaline Phosphatase: 91 U/L (ref 38–126)
Anion gap: 18 — ABNORMAL HIGH (ref 5–15)
BUN: 61 mg/dL — ABNORMAL HIGH (ref 6–20)
CO2: 24 mmol/L (ref 22–32)
Calcium: 8.8 mg/dL — ABNORMAL LOW (ref 8.9–10.3)
Chloride: 89 mmol/L — ABNORMAL LOW (ref 98–111)
Creatinine, Ser: 7.82 mg/dL — ABNORMAL HIGH (ref 0.61–1.24)
GFR, Estimated: 7 mL/min — ABNORMAL LOW (ref >60)
Glucose, Bld: 97 mg/dL (ref 70–99)
Potassium: 5.7 mmol/L — ABNORMAL HIGH (ref 3.5–5.1)
Sodium: 131 mmol/L — ABNORMAL LOW (ref 135–145)
Total Bilirubin: 1.1 mg/dL (ref 0.3–1.2)
Total Protein: 7.4 g/dL (ref 6.5–8.1)

## 2021-11-01 LAB — TSH: TSH: 1.418 u[IU]/mL (ref 0.350–4.500)

## 2021-11-01 LAB — OSMOLALITY: Osmolality: 288 mOsm/kg (ref 275–295)

## 2021-11-01 LAB — HEPATITIS B SURFACE ANTIBODY,QUALITATIVE: Hep B S Ab: REACTIVE — AB

## 2021-11-01 LAB — BRAIN NATRIURETIC PEPTIDE: B Natriuretic Peptide: 1194 pg/mL — ABNORMAL HIGH (ref 0.0–100.0)

## 2021-11-01 LAB — PHOSPHORUS: Phosphorus: 8.7 mg/dL — ABNORMAL HIGH (ref 2.5–4.6)

## 2021-11-01 LAB — HEPATITIS B SURFACE ANTIGEN: Hepatitis B Surface Ag: NONREACTIVE

## 2021-11-01 LAB — MAGNESIUM: Magnesium: 2.9 mg/dL — ABNORMAL HIGH (ref 1.7–2.4)

## 2021-11-01 MED ORDER — SODIUM CHLORIDE 0.9 % IV SOLN: 100.0000 mL | INTRAVENOUS | Status: DC | PRN

## 2021-11-01 MED ORDER — POLYETHYLENE GLYCOL 3350 17 G PO PACK
17.0000 g | PACK | Freq: Every day | ORAL | Status: DC
  Administered 2021-11-01: 17 g via ORAL
  Filled 2021-11-01: qty 1

## 2021-11-01 MED ORDER — HEPARIN SODIUM (PORCINE) 1000 UNIT/ML DIALYSIS
1000.0000 [IU] | INTRAMUSCULAR | Status: DC | PRN
  Filled 2021-11-01: qty 1

## 2021-11-01 MED ORDER — MELATONIN 3 MG PO TABS: 3.0000 mg | ORAL_TABLET | Freq: Every evening | ORAL | Status: DC | PRN

## 2021-11-01 MED ORDER — SODIUM ZIRCONIUM CYCLOSILICATE 10 G PO PACK
10.0000 g | PACK | Freq: Once | ORAL | Status: AC
  Administered 2021-11-01: 10 g via ORAL
  Filled 2021-11-01: qty 1

## 2021-11-01 MED ORDER — PENTAFLUOROPROP-TETRAFLUOROETH EX AERO
1.0000 "application " | INHALATION_SPRAY | CUTANEOUS | Status: DC | PRN
  Filled 2021-11-01: qty 116

## 2021-11-01 MED ORDER — ALTEPLASE 2 MG IJ SOLR: 2.0000 mg | Freq: Once | INTRAMUSCULAR | Status: DC | PRN

## 2021-11-01 MED ORDER — LIDOCAINE HCL (PF) 1 % IJ SOLN: 5.0000 mL | INTRAMUSCULAR | Status: DC | PRN

## 2021-11-01 MED ORDER — BISACODYL 10 MG RE SUPP: 10.0000 mg | Freq: Every day | RECTAL | Status: DC | PRN

## 2021-11-01 MED ORDER — HEPARIN SODIUM (PORCINE) 1000 UNIT/ML DIALYSIS
4000.0000 [IU] | Freq: Once | INTRAMUSCULAR | Status: DC
  Filled 2021-11-01: qty 4

## 2021-11-01 MED ORDER — LIDOCAINE-PRILOCAINE 2.5-2.5 % EX CREA
1.0000 "application " | TOPICAL_CREAM | CUTANEOUS | Status: DC | PRN
  Filled 2021-11-01: qty 5

## 2021-11-01 MED ORDER — CHLORHEXIDINE GLUCONATE CLOTH 2 % EX PADS: 6.0000 | MEDICATED_PAD | Freq: Every day | CUTANEOUS | Status: DC

## 2021-11-01 MED ORDER — SENNOSIDES-DOCUSATE SODIUM 8.6-50 MG PO TABS
2.0000 | ORAL_TABLET | Freq: Every day | ORAL | Status: DC
  Administered 2021-11-01: 2 via ORAL
  Filled 2021-11-01: qty 2

## 2021-11-01 NOTE — Assessment & Plan Note (Signed)
Nephrology consulted.  I discussed with Dr. Joylene Grapes. TTS dialysis schedule. Left upper arm AV fistula with thrill.

## 2021-11-01 NOTE — Assessment & Plan Note (Signed)
Volume management across HD. Coreg dose reduced to allow for blood pressure during HD. Currently holding hydralazine and Imdur. Closely monitor and consider further medication adjustments.

## 2021-11-01 NOTE — Hospital Course (Signed)
61 year old male with medical history significant for ESRD on TTS HD, chronic combined systolic and diastolic CHF, chronically elevated troponin, anemia in ESRD, hypertension, prior polysubstance abuse, presented to the ED on 10/31/2021 with complaints of progressive dyspnea, orthopnea, PND, bilateral leg edema and cough.  Admitted for acute on chronic combined diastolic and systolic CHF/volume overload renal ESRD hemodialysis patient.  Nephrology consulted and awaiting HD.

## 2021-11-01 NOTE — Assessment & Plan Note (Signed)
Potassium 5.7. Give a dose of Lokelma 10 g x 1 while he waits for HD.

## 2021-11-01 NOTE — Progress Notes (Signed)
PROGRESS NOTE   Timothy [name]  QJJ:[id number]    DOB: 04/06/1961    DOA: 10/31/2021  PCP: [name], MD   I have briefly reviewed patients previous medical records in [facility].  Chief Complaint  Patient presents with   Chest Pain    Hospital Course:  61 year old male with medical history significant for ESRD on TTS HD, chronic combined systolic and diastolic CHF, chronically elevated troponin, anemia in ESRD, hypertension, prior polysubstance abuse, presented to the ED on 10/31/2021 with complaints of progressive dyspnea, orthopnea, PND, bilateral leg edema and cough.  Admitted for acute on chronic combined diastolic and systolic CHF/volume overload renal ESRD hemodialysis patient.  Nephrology consulted and awaiting HD.   Assessment & Plan:  Principal Problem:   Acute on chronic combined systolic and diastolic CHF (congestive heart failure) (HCC) Active Problems:   ESRD on hemodialysis (HCC)   Acute hyponatremia   Hyperkalemia   HTN (hypertension)   Elevated troponin   Anemia in ESRD (end-stage renal disease) (HCC)   Substance abuse (HCC)   Acute on chronic combined systolic and diastolic CHF (congestive heart failure) ([place]) Presented with progressive dyspnea, orthopnea, PND and worsening leg edema Chest x-ray with CHF 2D echo December 2022: LVEF 40-45%. Reports compliance with dialysis and medications. Acute decompensation felt to be related to recurrent incomplete hemodialysis sessions over the last few weeks due to intra dialysis hypotension episodes. Recent similar hospitalization in December 2022. Nephrology consulted for urgent dialysis needs.  As per report, nephrology plans serial HD sessions.  I discussed with nephrologist today.  ESRD on hemodialysis [facility]) Nephrology consulted.  I discussed with Dr. Joylene Grapes. TTS dialysis schedule. Left upper arm AV fistula with thrill.  Acute hyponatremia Likely due to ESRD and volume overload Mild and  stable  Anemia in ESRD (end-stage renal disease) (HCC) Stable  Elevated troponin No anginal type chest pain Reportedly with chronic elevation. High-sensitivity troponins were in the 100 range in August and December 2022. High-sensitivity troponin 84 > 81. Elevated likely due to demand ischemia.  Low concern for ACS.  HTN (hypertension) Volume management across HD. Coreg dose reduced to allow for blood pressure during HD. Currently holding hydralazine and Imdur. Closely monitor and consider further medication adjustments.  Substance abuse ([place]) In December 2022, UDS positive for [facility] Recheck UDS today. Cessation to be counseled.  Hyperkalemia Potassium 5.7. Give a dose of Lokelma 10 g x 1 while he waits for HD.    Body mass index is 17.45 kg/m.    DVT prophylaxis: SCDs Start: 10/31/21 2142     Code Status: Full Code:  Family Communication: None at bedside. Disposition:  Status is: Observation  The patient remains OBS appropriate and will d/c before 2 midnights.       Consultants:   Nephrology  Procedures:     Antimicrobials:      Subjective:  Seen this morning while still in ED.  Patient expressed frustration that he has not yet gotten a hospital bed or while on dialysis.  He states that if he had been at home, by this morning he would have completed half of his dialysis cycle today.  Ongoing DOE, dry cough, chest pain with coughing only.  Objective:   Vitals:   11/01/21 1127 11/01/21 1130 11/01/21 1200 11/01/21 1230  BP: 118/80 (!) 132/93 (!) 124/92 (!) 128/95  Pulse: 94 94 94 92  Resp: (!) 23 19    Temp:      TempSrc:  SpO2: 100% 100% 100% 98%  Weight:      Height:        General exam: Young male, moderately built and nourished lying comfortably propped up in bed without distress. Respiratory system: Reduced breath sounds in the bases with bibasal crackles.  Rest of lung fields clear.  No increased work of breathing.  Telemetry personally  reviewed at bedside: Sinus rhythm. Cardiovascular system: S1 & S2 heard, RRR. No murmurs, rubs, gallops or clicks.  JVD++.  Did not appreciate any pedal edema. Gastrointestinal system: Abdomen is nondistended, soft and nontender. No organomegaly or masses felt. Normal bowel sounds heard. Central nervous system: Alert and oriented. No focal neurological deficits. Extremities: Symmetric 5 x 5 power.  Left upper arm with AV fistula with good thrill. Skin: No rashes, lesions or ulcers Psychiatry: Judgement and insight appear normal. Mood & affect appropriate.     Data Reviewed:   I have personally reviewed following labs and imaging studies   CBC: Recent Labs  Lab 10/31/21 1459 11/01/21 0304  WBC 7.5 7.4  NEUTROABS  --  4.9  HGB 10.2* 9.8*  HCT 31.6* 29.9*  MCV 81.9 81.5  PLT 451* 460*    Basic Metabolic Panel: Recent Labs  Lab 10/31/21 1459 11/01/21 0304  NA 131* 131*  K 4.8 5.7*  CL 87* 89*  CO2 25 24  GLUCOSE 87 97  BUN 48* 61*  CREATININE 6.92* 7.82*  CALCIUM 8.9 8.8*  MG  --  2.9*  PHOS  --  8.7*    Liver Function Tests: Recent Labs  Lab 11/01/21 0304  AST 24  ALT 15  ALKPHOS 91  BILITOT 1.1  PROT 7.4  ALBUMIN 2.7*    CBG: No results for input(s): GLUCAP in the last 168 hours.  Microbiology Studies:   Recent Results (from the past 240 hour(s))  Resp Panel by RT-PCR (Flu A&B, Covid) Nasopharyngeal Swab     Status: None   Collection Time: 10/31/21  7:02 PM   Specimen: Nasopharyngeal Swab; Nasopharyngeal(NP) swabs in vial transport medium  Result Value Ref Range Status   SARS Coronavirus 2 by RT PCR NEGATIVE NEGATIVE Final    Comment: (NOTE) SARS-CoV-2 target nucleic acids are NOT DETECTED.  The SARS-CoV-2 RNA is generally detectable in upper respiratory specimens during the acute phase of infection. The lowest concentration of SARS-CoV-2 viral copies this assay can detect is 138 copies/mL. A negative result does not preclude  SARS-Cov-2 infection and should not be used as the sole basis for treatment or other patient management decisions. A negative result may occur with  improper specimen collection/handling, submission of specimen other than nasopharyngeal swab, presence of viral mutation(s) within the areas targeted by this assay, and inadequate number of viral copies(<138 copies/mL). A negative result must be combined with clinical observations, patient history, and epidemiological information. The expected result is Negative.  Fact Sheet for Patients:  EntrepreneurPulse.com.au  Fact Sheet for Healthcare Providers:  IncredibleEmployment.be  This test is no t yet approved or cleared by the [place] FDA and  has been authorized for detection and/or diagnosis of SARS-CoV-2 by FDA under an Emergency Use Authorization (EUA). This EUA will remain  in effect (meaning this test can be used) for the duration of the COVID-19 declaration under Section 564(b)(1) of the Act, 21 U.S.C.section 360bbb-3(b)(1), unless the authorization is terminated  or revoked sooner.       Influenza A by PCR NEGATIVE NEGATIVE Final   Influenza B by PCR NEGATIVE NEGATIVE Final  Comment: (NOTE) The Xpert Xpress SARS-CoV-2/FLU/RSV plus assay is intended as an aid in the diagnosis of influenza from Nasopharyngeal swab specimens and should not be used as a sole basis for treatment. Nasal washings and aspirates are unacceptable for Xpert Xpress SARS-CoV-2/FLU/RSV testing.  Fact Sheet for Patients: EntrepreneurPulse.com.au  Fact Sheet for Healthcare Providers: IncredibleEmployment.be  This test is not yet approved or cleared by the [place] FDA and has been authorized for detection and/or diagnosis of SARS-CoV-2 by FDA under an Emergency Use Authorization (EUA). This EUA will remain in effect (meaning this test can be used) for the duration of  the COVID-19 declaration under Section 564(b)(1) of the Act, 21 U.S.C. section 360bbb-3(b)(1), unless the authorization is terminated or revoked.  Performed at [facility], [place] [address], [place], [place] [address]     Radiology Studies:  DG Chest 2 View  Result Date: 10/31/2021 CLINICAL DATA:  Chest pain EXAM: CHEST - 2 VIEW COMPARISON:  10/05/2021 FINDINGS: Stable enlarged cardiac silhouette. Interval improvement in pulmonary edema pattern seen on comparison exam. Probable LEFT basilar atelectasis. No pneumothorax pulmonary edema. IMPRESSION: 1. Improvement in pulmonary edema pattern seen on comparison exam. 2. Persistent marked cardiomegaly. Electronically Signed   By: Suzy Bouchard M.D.   On: 10/31/2021 15:37    Scheduled Meds:    carvedilol  3.125 mg Oral BID WC   Chlorhexidine Gluconate Cloth  6 each Topical Q0600   heparin  4,000 Units Dialysis Once in dialysis   sevelamer carbonate  1,600 mg Oral TID with meals    Continuous Infusions:    sodium chloride     sodium chloride       LOS: 0 days     Vernell Leep, MD,  FACP, [facility], [facility], [facility] (Care Management Physician Certified) [place]  To contact the attending provider between 7A-7P or the covering provider during after hours 7P-7A, please log into the web site [email or web address] and access using universal [place] password for that web site. If you do not have the password, please call the hospital operator.  11/01/2021, 12:51 PM

## 2021-11-01 NOTE — ED Notes (Signed)
Dr. Velia Meyer notified about patient's concern for home meds - patient complaining of back pain. New meds ordered.

## 2021-11-01 NOTE — Consult Note (Signed)
[facility] Renal Consultation Note    Indication for Consultation:  Management of ESRD/hemodialysis; anemia, hypertension/volume and secondary hyperparathyroidism  HPI: Timothy [name] is a 61 y.o. male with ESRD on HD TTS, malignant HTN, chronic diastolic CHF, polysubstance abuse. He is admitted under observation status with worsening dyspnea. He  has been compliant with dialysis treatments and actually had extra dialysis last week d/t volume excess. He was >10 kg over his dry weight. He's been eating a lot of ice, because he feels "dehydrated". Last dialysis was Saturday, then begun having SOB with chest pain on Sunday night. Pulmonary edema on CXR, improved from prior reading in December.   Seen now in dialysis unit. UF goal 4L. Appears comfortable on RA. Denies cp, dyspnea. Says he's going home after dialysis. We talked about finding a substitute for ice.   Past Medical History:  Diagnosis Date   Anemia of chronic kidney failure    BPH (benign prostatic hyperplasia)    Colon cancer ([place]) 2014   End stage renal disease on dialysis ([place]) 2017   Hypertension    Hypertensive heart disease with chronic diastolic congestive heart failure ([place]) 07/17/2016   Polysubstance abuse ([place])    History of heroin and marijuana use   Past Surgical History:  Procedure Laterality Date   ABDOMINAL SURGERY     AV FISTULA PLACEMENT Left 09/19/2016   Procedure: Left arm Radiocephalic ARTERIOVENOUS (AV) FISTULA CREATION;  Surgeon: Conrad Zebulon, MD;  Location: [place];  Service: Vascular;  Laterality: Left;   BASCILIC VEIN TRANSPOSITION Left 07/09/2017   Procedure: BRACHIOCEPHALIC FISTULA CREATION;  Surgeon: Conrad [place], MD;  Location: [place];  Service: Vascular;  Laterality: Left;   COLON SURGERY  2014   INSERTION OF DIALYSIS CATHETER N/A 09/19/2016   Procedure: INSERTION OF TUNNELED DIALYSIS CATHETER;  Surgeon: Conrad [place], MD;  Location: [place];  Service: Vascular;  Laterality: N/A;   IR  GENERIC HISTORICAL  09/14/2016   IR US GUIDE VASC ACCESS RIGHT 09/14/2016 [name], DO [facility]   IR GENERIC HISTORICAL  09/14/2016   IR FLUORO GUIDE CV LINE RIGHT 09/14/2016 [name], DO [facility]   IR PARACENTESIS  06/22/2021   LAPAROTOMY N/A 05/23/2021   Procedure: EXPLORATORY LAPAROTOMY LYSIS ADHESIONS;  Surgeon: [name], MD;  Location: [place];  Service: General;  Laterality: N/A;   ORIF TIBIA PLATEAU Left 01/15/2018   Procedure: OPEN REDUCTION INTERNAL FIXATION (ORIF) TIBIAL PLATEAU;  Surgeon: Altamese Victoria, MD;  Location: [place];  Service: Orthopedics;  Laterality: Left;   TRANSTHORACIC ECHOCARDIOGRAM  07/2016    EF 60-65%, No RWMA. Mod Concentric LVH - Gr 2 DD. Severe LA dilation. PAP ~35 mmHg (mild Pulm HTN)  --> no changes noted 1 month later   Family History  Problem Relation Age of Onset   Heart failure Mother        Died at age 55.   Heart attack Mother 40   Hypertension Mother    Diabetes Mellitus II Mother    Other Father        Unknown   Kidney failure Sister        (Oldest Sister)   Other Other        Multiple siblings have started her heart disease, he is not sure of the details.   CAD Nephew    Social History:  reports that he has been smoking cigarettes. He has been smoking an average of .25 packs per day. He has never used  smokeless tobacco. He reports current drug use. Frequency: 1.00 time per week. Drugs: Marijuana and Heroin. He reports that he does not drink alcohol. No Known Allergies Prior to Admission medications   Medication Sig Start Date End Date Taking? Authorizing Provider  carvedilol (COREG) 6.25 MG tablet Take 1 tablet (6.25 mg total) by mouth 2 (two) times daily with a meal. 10/09/21  Yes Sharen Hones, MD  hydrALAZINE (APRESOLINE) 25 MG tablet Take 1 tablet (25 mg total) by mouth every 8 (eight) hours. 10/09/21  Yes Sharen Hones, MD  Oxycodone HCl 10 MG TABS Take 10 mg by mouth 2 (two) times daily as needed (pain). 10/26/21  Yes  [provider]  sevelamer carbonate (RENVELA) 800 MG tablet Take 2 tablets by mouth with breakfast, with lunch, and with evening meal. 05/12/21  Yes [provider]  isosorbide mononitrate (IMDUR) 30 MG 24 hr tablet Take 1 tablet (30 mg total) by mouth daily. Patient not taking: Reported on 11/01/2021 10/09/21   Sharen Hones, MD   Current Facility-Administered Medications  Medication Dose Route Frequency Provider Last Rate Last Admin   0.9 %  sodium chloride infusion  100 mL Intravenous PRN Lynnda Child, PA-C       0.9 %  sodium chloride infusion  100 mL Intravenous PRN Kiarrah Rausch Grace, PA-C       acetaminophen (TYLENOL) tablet 650 mg  650 mg Oral Q6H PRN Howerter, Justin B, DO   650 mg at 10/31/21 2305   Or   acetaminophen (TYLENOL) suppository 650 mg  650 mg Rectal Q6H PRN Howerter, Justin B, DO       alteplase (CATHFLO ACTIVASE) injection 2 mg  2 mg Intracatheter Once PRN Lynnda Child, PA-C       carvedilol (COREG) tablet 3.125 mg  3.125 mg Oral BID WC Howerter, Justin B, DO   3.125 mg at 11/01/21 9449   Chlorhexidine Gluconate Cloth 2 % PADS 6 each  6 each Topical Q0600 Lynnda Child, PA-C       heparin injection 1,000 Units  1,000 Units Dialysis PRN Lynnda Child, PA-C       heparin injection 4,000 Units  4,000 Units Dialysis Once in dialysis Lynnda Child, PA-C       hydrALAZINE (APRESOLINE) injection 10 mg  10 mg Intravenous Q4H PRN Howerter, Justin B, DO       lidocaine (PF) (XYLOCAINE) 1 % injection 5 mL  5 mL Intradermal PRN Lynnda Child, PA-C       lidocaine-prilocaine (EMLA) cream 1 application  1 application Topical PRN Jannis Atkins, Thomos Lemons, PA-C       melatonin tablet 3 mg  3 mg Oral QHS PRN Howerter, Justin B, DO       oxyCODONE-acetaminophen (PERCOCET/ROXICET) 5-325 MG per tablet 2 tablet  2 tablet Oral Q6H PRN Howerter, Justin B, DO   2 tablet at 11/01/21 6759   pentafluoroprop-tetrafluoroeth (GEBAUERS)  aerosol 1 application  1 application Topical PRN Lynnda Child, PA-C       sevelamer carbonate (RENVELA) tablet 1,600 mg  1,600 mg Oral TID with meals Howerter, Justin B, DO   1,600 mg at 11/01/21 0742     ROS: As per HPI otherwise negative.  Physical Exam: Vitals:   11/01/21 1127 11/01/21 1130 11/01/21 1200 11/01/21 1230  BP: 118/80 (!) 132/93 (!) 124/92 (!) 128/95  Pulse: 94 94 94 92  Resp: (!) 23 19    Temp:      TempSrc:  SpO2: 100% 100% 100% 98%  Weight:      Height:         General: Lying flat in bed, nad   Head: NCAT sclera not icteric MMM Neck: Supple. +JVD  Lungs: Scattered rhonci, normal WOB  Heart: RRR, no murmur, rub, or gallop  Abdomen: soft non-tender, bowel sounds normal, no masses  Lower extremities: Trace LE edema  Neuro: A & O X 3. Moves all extremities spontaneously. Psych:  Responds to questions appropriately with a normal affect. Dialysis Access: LUE AVF +bruit   Labs: Basic Metabolic Panel: Recent Labs  Lab 10/31/21 1459 11/01/21 0304  NA 131* 131*  K 4.8 5.7*  CL 87* 89*  CO2 25 24  GLUCOSE 87 97  BUN 48* 61*  CREATININE 6.92* 7.82*  CALCIUM 8.9 8.8*  PHOS  --  8.7*   Liver Function Tests: Recent Labs  Lab 11/01/21 0304  AST 24  ALT 15  ALKPHOS 91  BILITOT 1.1  PROT 7.4  ALBUMIN 2.7*   No results for input(s): LIPASE, AMYLASE in the last 168 hours. No results for input(s): AMMONIA in the last 168 hours. CBC: Recent Labs  Lab 10/31/21 1459 11/01/21 0304  WBC 7.5 7.4  NEUTROABS  --  4.9  HGB 10.2* 9.8*  HCT 31.6* 29.9*  MCV 81.9 81.5  PLT 451* 460*   Cardiac Enzymes: No results for input(s): CKTOTAL, CKMB, CKMBINDEX, TROPONINI in the last 168 hours. CBG: No results for input(s): GLUCAP in the last 168 hours. Iron Studies: No results for input(s): IRON, TIBC, TRANSFERRIN, FERRITIN in the last 72 hours. Studies/Results: DG Chest 2 View  Result Date: 10/31/2021 CLINICAL DATA:  Chest pain EXAM: CHEST - 2 VIEW  COMPARISON:  10/05/2021 FINDINGS: Stable enlarged cardiac silhouette. Interval improvement in pulmonary edema pattern seen on comparison exam. Probable LEFT basilar atelectasis. No pneumothorax pulmonary edema. IMPRESSION: 1. Improvement in pulmonary edema pattern seen on comparison exam. 2. Persistent marked cardiomegaly. Electronically Signed   By: Suzy Bouchard M.D.   On: 10/31/2021 15:37    Dialysis Orders:  Unit: [facility]  Time: 3h 45 min  EDW: 65 kg  Flows: 450/A1.5x  [place]ath: 2K/2Ca  Access: AVF  Heparin: None  ESA: Mircera 150 q 2 weeks  VDRA: Hectorol 1 mcg TIW   Assessment/Plan: Pulmonary edema/volume overload -- Dialysis today for volume removal. Reinforced fluid restriction with patient. Reassess need for serial HD in the am, if stays in hospital.  ESRD -  HD TTS. K 5.7.  Dialysis on schedule today.  Hypertension/volume  - BP acceptable. UF to dry weight as tolerated  Anemia  - Hgb 9.8 on ESA.  Metabolic bone disease -  Continue home binders, Hectorol if stays in hospital.   Lynnda Child PA-C [facility] 11/01/2021, 12:50 PM

## 2021-11-01 NOTE — ED Notes (Signed)
Lunch Ordered °

## 2021-11-01 NOTE — Assessment & Plan Note (Signed)
Likely due to ESRD and volume overload Mild and stable

## 2021-11-01 NOTE — ED Notes (Signed)
This RN attempts IV placement. Pt informed on possible need for IV in the event of an emergency or if PRN medication needed. Refusing IV at this time- just wants dialysis. Pt is informed that dialysis will have to be done in the morning due to no staff being present to perform treatment at this time.

## 2021-11-01 NOTE — ED Notes (Signed)
Placed Breakfast Order 

## 2021-11-01 NOTE — Assessment & Plan Note (Signed)
In December 2022, UDS positive for [facility]. Recheck UDS today. Cessation to be counseled.

## 2021-11-01 NOTE — Assessment & Plan Note (Signed)
No anginal type chest pain Reportedly with chronic elevation. High-sensitivity troponins were in the 100 range in August and December 2022. High-sensitivity troponin 84 > 81. Elevated likely due to demand ischemia.  Low concern for ACS.

## 2021-11-01 NOTE — Progress Notes (Signed)
Hemodialysis- Treatment completed without issue. 3L removed. Patient did have some cramping during treatment relieved with uf off and saline bolus. Tolerated well. Report called to [facility]

## 2021-11-01 NOTE — Assessment & Plan Note (Signed)
Presented with progressive dyspnea, orthopnea, PND and worsening leg edema Chest x-ray with CHF 2D echo December 2022: LVEF 40-45%. Reports compliance with dialysis and medications. Acute decompensation felt to be related to recurrent incomplete hemodialysis sessions over the last few weeks due to intra dialysis hypotension episodes. Recent similar hospitalization in December 2022. Nephrology consulted for urgent dialysis needs.  As per report, nephrology plans serial HD sessions.  I discussed with nephrologist today.

## 2021-11-01 NOTE — Assessment & Plan Note (Signed)
Stable

## 2021-11-02 DIAGNOSIS — K920 Hematemesis: Secondary | ICD-10-CM | POA: Diagnosis not present

## 2021-11-02 DIAGNOSIS — K264 Chronic or unspecified duodenal ulcer with hemorrhage: Secondary | ICD-10-CM | POA: Diagnosis not present

## 2021-11-02 DIAGNOSIS — I5043 Acute on chronic combined systolic (congestive) and diastolic (congestive) heart failure: Secondary | ICD-10-CM | POA: Diagnosis not present

## 2021-11-02 LAB — BASIC METABOLIC PANEL
Anion gap: 19 — ABNORMAL HIGH (ref 5–15)
BUN: 50 mg/dL — ABNORMAL HIGH (ref 6–20)
CO2: 22 mmol/L (ref 22–32)
Calcium: 8.3 mg/dL — ABNORMAL LOW (ref 8.9–10.3)
Chloride: 89 mmol/L — ABNORMAL LOW (ref 98–111)
Creatinine, Ser: 5.51 mg/dL — ABNORMAL HIGH (ref 0.61–1.24)
GFR, Estimated: 11 mL/min — ABNORMAL LOW (ref >60)
Glucose, Bld: 82 mg/dL (ref 70–99)
Potassium: 5.1 mmol/L (ref 3.5–5.1)
Sodium: 130 mmol/L — ABNORMAL LOW (ref 135–145)

## 2021-11-02 LAB — HEPATITIS B SURFACE ANTIBODY, QUANTITATIVE: Hep B S AB Quant (Post): 67.7 m[IU]/mL (ref >9.9)

## 2021-11-02 MED ORDER — HYDRALAZINE HCL 25 MG PO TABS: 25.0000 mg | ORAL_TABLET | Freq: Three times a day (TID) | ORAL | 0 refills | Status: DC

## 2021-11-02 MED ORDER — SODIUM ZIRCONIUM CYCLOSILICATE 10 G PO PACK: 10.0000 g | PACK | Freq: Once | ORAL | Status: DC

## 2021-11-02 NOTE — Progress Notes (Signed)
Patient refused lokelma. AM potassium level is 5.1. Paged Nephrology to make aware. RN educated patient on the importance of potassium level being a HD and CHF patient. Patient verbalize understanding and verbalize that he wants to go home.

## 2021-11-02 NOTE — TOC Transition Note (Signed)
Transition of Care [facility]) - CM/SW Discharge Note   Patient Details  Name: Timothy [name] MRN: [id number] Date of Birth: [birth year]  [facility] CM/SW Contact:  Zenon Mayo, RN Phone Number: 11/02/2021, 10:10 AM   Clinical Narrative:    Patient is for dc today, he is from home with wife and kids, indep. Wife will transport him home at discharge. He has no needs.    Final next level of care: Home/Self Care Barriers to Discharge: No Barriers Identified   Patient Goals and CMS Choice     Choice offered to / list presented to : NA  Discharge Placement                       Discharge Plan and Services                  DME Agency: NA       HH Arranged: NA          Social Determinants of Health (SDOH) Interventions     Readmission Risk Interventions No flowsheet data found.

## 2021-11-02 NOTE — Progress Notes (Signed)
Nephrology Follow-Up Consult note   Assessment/Recommendations: Timothy [name] is a/an 61 y.o. male with a past medical history significant for ESRD, admitted for volume overload.     Pulmonary edema/volume overload: Clinically improved.  Persistently distended neck veins but unable to take off more fluid last night due to cramping.  I think he is okay for discharge to continue dialysis outpatient tomorrow.  ESRD: TTS schedule at [facility].  Resume dialysis outpatient tomorrow.  Hypertension: BP acceptable.  Continue to challenge dry weight as able  Anemia due to ESRD: Continue ESA outpatient  Secondary hyperparathyroidism: Can continue Hectorol tomorrow if he remains in the hospital.  Continue binders   Recommendations conveyed to primary service.    [facility] 11/02/2021 9:51 AM  ___________________________________________________________  CC: ESRD  Interval History/Subjective: Patient overall feels fine today.  Did have some cramping with dialysis last night and feels like he may have had too much fluid taken off.   Medications:  Current Facility-Administered Medications  Medication Dose Route Frequency Provider Last Rate Last Admin   acetaminophen (TYLENOL) tablet 650 mg  650 mg Oral Q6H PRN Howerter, Justin B, DO   650 mg at 10/31/21 2305   Or   acetaminophen (TYLENOL) suppository 650 mg  650 mg Rectal Q6H PRN Howerter, Justin B, DO       bisacodyl (DULCOLAX) suppository 10 mg  10 mg Rectal Daily PRN Modena Jansky, MD       carvedilol (COREG) tablet 3.125 mg  3.125 mg Oral BID WC Howerter, Justin B, DO   3.125 mg at 11/02/21 2423   Chlorhexidine Gluconate Cloth 2 % PADS 6 each  6 each Topical Q0600 Lynnda Child, PA-C       hydrALAZINE (APRESOLINE) injection 10 mg  10 mg Intravenous Q4H PRN Howerter, Justin B, DO       melatonin tablet 3 mg  3 mg Oral QHS PRN Howerter, Justin B, DO       oxyCODONE-acetaminophen (PERCOCET/ROXICET)  5-325 MG per tablet 2 tablet  2 tablet Oral Q6H PRN Howerter, Justin B, DO   2 tablet at 11/02/21 0531   polyethylene glycol (MIRALAX / GLYCOLAX) packet 17 g  17 g Oral Daily Modena Jansky, MD   17 g at 11/01/21 1736   senna-docusate (Senokot-S) tablet 2 tablet  2 tablet Oral QHS Modena Jansky, MD   2 tablet at 11/01/21 2147   sevelamer carbonate (RENVELA) tablet 1,600 mg  1,600 mg Oral TID with meals Howerter, Justin B, DO   1,600 mg at 11/02/21 5361   sodium zirconium cyclosilicate (LOKELMA) packet 10 g  10 g Oral Once Reesa Chew, MD          Review of Systems: 10 systems reviewed and negative except per interval history/subjective  Physical Exam: Vitals:   11/02/21 0727 11/02/21 0811  BP: (!) 155/107 (!) 144/100  Pulse: 94 94  Resp: 19   Temp: 98.5 F (36.9 C)   SpO2: 98%    Total I/O In: 240 [P.O.:240] Out: -   Intake/Output Summary (Last 24 hours) at 11/02/2021 0951 Last data filed at 11/02/2021 4431 Gross per 24 hour  Intake 420 ml  Output 3015 ml  Net -2595 ml   Constitutional: well-appearing, sitting up in bed ENMT: ears and nose without scars or lesions, MMM CV: normal rate, no significant LEE, neck veins distended Respiratory: bilateral chest rise, normal work of breathing Gastrointestinal: soft, non-tender, no palpable masses or hernias Skin:  no visible lesions or rashes Psych: alert, judgement/insight appropriate, appropriate mood and affect   Test Results I personally reviewed new and old clinical labs and radiology tests Lab Results  Component Value Date   NA 130 (L) 11/02/2021   K 5.1 11/02/2021   CL 89 (L) 11/02/2021   CO2 22 11/02/2021   BUN 50 (H) 11/02/2021   CREATININE 5.51 (H) 11/02/2021   CALCIUM 8.3 (L) 11/02/2021   ALBUMIN 2.7 (L) 11/01/2021   PHOS 8.7 (H) 11/01/2021

## 2021-11-02 NOTE — Progress Notes (Signed)
Spoke to Timothy [name], [employer], at [place] this am. Clinic aware pt will resume care tomorrow.   [name] Renal Navigator [phone]

## 2021-11-02 NOTE — Progress Notes (Signed)
Heart Failure Navigator Progress Note  Assessed for Heart & Vascular TOC clinic readiness.  Patient does not meet criteria due to ESRD on HD.   Navigator available for reassessment of patient.   Pricilla Holm, MSN, RN Heart Failure Nurse Navigator [phone]

## 2021-11-02 NOTE — Progress Notes (Signed)
Went over discharge instructions with patient. Patient made aware that there has been no changes to medication list, to take per usual. MD instructions to hold blood pressure prior to dialysis treatments. Patient verbalize understanding. Patient did not have PIV access upon assessment and did not allow RN to assess arms under long sleeve shirt. RN did not see any PIV charted. Patient removed tele monitor and CCMD has been notified. Transport has been called.

## 2021-11-03 NOTE — Discharge Summary (Signed)
Physician Discharge Summary  Timothy [name] FGH:[id number] DOB: 08-Oct-1961 DOA: 10/31/2021  PCP: [name], MD  Admit date: 10/31/2021 Discharge date: 11/02/2021  Time spent: 35 minutes  Recommendations for Outpatient Follow-up:  PCP in 1 week   Discharge Diagnoses:  Pulmonary edema ESRD on hemodialysis   Acute on chronic combined systolic and diastolic CHF (congestive heart failure) (HCC)   HTN (hypertension)   Elevated troponin   Substance abuse ([place])   Acute hyponatremia   Anemia in ESRD (end-stage renal disease) ([place])   Hyperkalemia   Discharge Condition: Stable  Diet recommendation: Renal  Filed Weights   11/01/21 0114 11/01/21 1521 11/02/21 0040  Weight: 60 kg 66.9 kg 66.8 kg    History of present illness:  61 year old male with medical history significant for ESRD on TTS HD, chronic combined systolic and diastolic CHF, chronically elevated troponin, anemia in ESRD, hypertension, prior polysubstance abuse, presented to the ED on 10/31/2021 with complaints of progressive dyspnea, orthopnea, PND, bilateral leg edema and cough.  Hospital Course:   Acute on chronic combined systolic and diastolic CHF (congestive heart failure) ([place]) Presented with progressive dyspnea, orthopnea, PND and worsening leg edema Chest x-ray with CHF 2D echo December 2022: LVEF 40-45%. Reports compliance with dialysis and medications. Acute decompensation felt to be related to recurrent incomplete hemodialysis sessions over the last few weeks due to intra dialysis hypotension episodes. -Improved with hemodialysis, tolerated without hypotensive episodes, patient advised to hold hydralazine prior to dialysis on TTS -Discharged home in a stable condition   ESRD on hemodialysis ([place]) -Stable, see above   Anemia in ESRD (end-stage renal disease) (HCC) Stable   Elevated troponin No anginal type chest pain Reportedly with chronic elevation. High-sensitivity troponins were in the 100  range in August and December 2022. High-sensitivity troponin 84 > 81. Elevated likely due to demand ischemia.  Low concern for ACS.   HTN (hypertension) Volume management across HD. Coreg dose reduced to allow for blood pressure during HD. -Resumed Imdur and hydralazine, advised to hold this on dialysis days in the morning   Substance abuse ([place]) In December 2022, UDS positive for [facility]. -Counseled   Hyperkalemia -Corrected with HD and Lokelma  Discharge Exam: Vitals:   11/02/21 0727 11/02/21 0811  BP: (!) 155/107 (!) 144/100  Pulse: 94 94  Resp: 19   Temp: 98.5 F (36.9 C)   SpO2: 98%     General: AAOx3 Cardiovascular: S1-S2, regular rate rhythm Respiratory: Clear  Discharge Instructions   Discharge Instructions     Discharge instructions   Complete by: As directed    Renal diet   Increase activity slowly   Complete by: As directed       Allergies as of 11/02/2021   No Known Allergies      Medication List     TAKE these medications    carvedilol 6.25 MG tablet Commonly known as: COREG Take 1 tablet (6.25 mg total) by mouth 2 (two) times daily with a meal.   hydrALAZINE 25 MG tablet Commonly known as: APRESOLINE Take 1 tablet (25 mg total) by mouth every 8 (eight) hours. DO not take this before dialysis on TTS What changed: additional instructions   Oxycodone HCl 10 MG Tabs Take 10 mg by mouth 2 (two) times daily as needed (pain).   sevelamer carbonate 800 MG tablet Commonly known as: RENVELA Take 2 tablets by mouth with breakfast, with lunch, and with evening meal.       No Known Allergies  The results of significant diagnostics from this hospitalization (including imaging, microbiology, ancillary and laboratory) are listed below for reference.    Significant Diagnostic Studies: DG Chest 2 View  Result Date: 10/31/2021 CLINICAL DATA:  Chest pain EXAM: CHEST - 2 VIEW COMPARISON:  10/05/2021 FINDINGS: Stable enlarged cardiac silhouette.  Interval improvement in pulmonary edema pattern seen on comparison exam. Probable LEFT basilar atelectasis. No pneumothorax pulmonary edema. IMPRESSION: 1. Improvement in pulmonary edema pattern seen on comparison exam. 2. Persistent marked cardiomegaly. Electronically Signed   By: Suzy Bouchard M.D.   On: 10/31/2021 15:37   DG Chest 2 View  Result Date: 10/05/2021 CLINICAL DATA:  Chest pain and dyspnea EXAM: CHEST - 2 VIEW COMPARISON:  10/01/2020 chest radiograph. FINDINGS: New mild enlargement of the cardiopericardial silhouette. Otherwise normal mediastinal contour. No pneumothorax. No pleural effusion. Mild diffuse prominence of the parahilar interstitial markings. Mild curvilinear right lung base scarring versus atelectasis. IMPRESSION: New mild enlargement of the cardiopericardial silhouette. Mild diffuse prominence of the parahilar interstitial markings. Findings could represent mild congestive heart failure or atypical infection. Electronically Signed   By: Ilona Sorrel M.D.   On: 10/05/2021 09:05   CT Angio Chest PE W and/or Wo Contrast  Result Date: 10/05/2021 CLINICAL DATA:  Pulmonary embolism (PE) suspected, high prob. Dyspnea. EXAM: CT ANGIOGRAPHY CHEST WITH CONTRAST TECHNIQUE: Multidetector CT imaging of the chest was performed using the standard protocol during bolus administration of intravenous contrast. Multiplanar CT image reconstructions and MIPs were obtained to evaluate the vascular anatomy. CONTRAST:  12mL OMNIPAQUE IOHEXOL 350 MG/ML SOLN COMPARISON:  Chest radiograph from earlier today. 06/17/2021 chest CT. FINDINGS: Cardiovascular: The study is high quality for the evaluation of pulmonary embolism. There are no filling defects in the central, lobar, segmental or subsegmental pulmonary artery branches to suggest acute pulmonary embolism. Atherosclerotic thoracic aorta with dilated 4.3 cm ascending thoracic aorta, stable. Dilated main pulmonary artery (3.5 cm diameter), stable.  Mild-to-moderate cardiomegaly, increased from prior CT. Small pericardial effusion/thickening, increased from prior. Left anterior descending and right coronary atherosclerosis. Mediastinum/Nodes: No discrete thyroid nodules. Unremarkable esophagus. No axillary adenopathy. No pathologically enlarged mediastinal nodes. Mildly prominent 1.3 cm right hilar node (series 5/image 69), new. No additional enlarged hilar nodes. Lungs/Pleura: No pneumothorax. No pleural effusion. Scattered mild platelike scarring versus atelectasis throughout both lungs. Mild diffuse interlobular septal thickening. Mild patchy ground-glass opacity throughout both lungs. Finely nodular patchy foci of peribronchovascular consolidation throughout both lungs, most prominent in basilar right upper lobe. No lung masses. Upper abdomen: Small volume upper abdominal ascites. Musculoskeletal: No aggressive appearing focal osseous lesions. Anasarca. Review of the MIP images confirms the above findings. IMPRESSION: 1. No pulmonary embolism. 2. Mild-to-moderate cardiomegaly, increased from prior CT. Mild interlobular septal thickening, mild patchy ground-glass opacity and finely nodular patchy peribronchovascular consolidation throughout both lungs. Anasarca. Small volume upper abdominal ascites. This constellation of findings is most suggestive of congestive heart failure with fluid overload. A component of atypical pneumonia is difficult to exclude by imaging, however is less favored. 3. Small pericardial effusion/thickening, increased from prior CT. 4. Dilated main pulmonary artery, stable, suggesting chronic pulmonary arterial hypertension. 5. Two-vessel coronary atherosclerosis. 6. Stable dilated 4.3 cm ascending thoracic aorta. Recommend annual imaging followup by CTA or MRA. This recommendation follows 2010 ACCF/AHA/AATS/ACR/ASA/SCA/SCAI/SIR/STS/SVM Guidelines for the Diagnosis and Management of Patients with Thoracic Aortic Disease. Circulation.  2010; 121: O037-C488. Aortic aneurysm NOS (ICD10-I71.9). 7. Mild right hilar adenopathy, nonspecific, probably reactive. 8. Aortic Atherosclerosis (ICD10-I70.0). Electronically Signed   By: Corene Cornea  A Poff M.D.   On: 10/05/2021 12:29   ECHOCARDIOGRAM LIMITED  Result Date: 10/05/2021    ECHOCARDIOGRAM LIMITED REPORT   Patient Name:   Timothy [name] Date of Exam: 10/05/2021 Medical Rec #:  [id number]       Height:       73.0 in Accession #:    [id number]      Weight:       150.0 lb Date of Birth:  Jul 20, 1961       BSA:          1.904 m Patient Age:    28 years        BP:           152/104 mmHg Patient Gender: M               HR:           79 bpm. Exam Location:  Inpatient Procedure: Limited Echo, Color Doppler and Cardiac Doppler Indications:    R01.1 Murmur  History:        Patient has prior history of Echocardiogram examinations, most                 recent 06/20/2021. Risk Factors:Hypertension and Polysubstance                 Abuse.  Sonographer:    Raquel Sarna Senior RDCS Referring Phys: [place]  1. Left ventricular ejection fraction, by estimation, is 40 to 45%. The left ventricle has mildly decreased function. The left ventricle demonstrates global hypokinesis. There is moderate concentric left ventricular hypertrophy. Left ventricular diastolic function could not be evaluated.  2. Right ventricular systolic function is low normal. The right ventricular size is moderately enlarged. Moderately increased right ventricular wall thickness. There is moderately elevated pulmonary artery systolic pressure.  3. A small pericardial effusion is present. The pericardial effusion is circumferential. There is no evidence of cardiac tamponade.  4. Moderate mitral valve regurgitation.  5. Tricuspid valve regurgitation is moderate to severe.  6. The aortic valve is tricuspid. There is mild calcification of the aortic valve. There is moderate thickening of the aortic valve. Aortic valve regurgitation is mild.   7. The inferior vena cava is dilated in size with <50% respiratory variability, suggesting right atrial pressure of 15 mmHg. Comparison(s): A prior study was performed on 06/20/2021. The left ventricular function is worsened. The right ventricle is now dilated. There is marked worsening of the mitral insufficiency and the tricuspid insufficiency. There is a new small pericardial effusion. There is evidence of hypervolemia/ increased right atrial pressure FINDINGS  Left Ventricle: Left ventricular ejection fraction, by estimation, is 40 to 45%. The left ventricle has mildly decreased function. The left ventricle demonstrates global hypokinesis. The left ventricular internal cavity size was normal in size. There is  moderate concentric left ventricular hypertrophy. Left ventricular diastolic function could not be evaluated. Right Ventricle: The right ventricular size is moderately enlarged. Moderately increased right ventricular wall thickness. Right ventricular systolic function is low normal. There is moderately elevated pulmonary artery systolic pressure. The tricuspid regurgitant velocity is 3.40 m/s, and with an assumed right atrial pressure of 15 mmHg, the estimated right ventricular systolic pressure is 40.0 mmHg. Pericardium: A small pericardial effusion is present. The pericardial effusion is circumferential. There is no evidence of cardiac tamponade. Mitral Valve: Moderate mitral valve regurgitation, with centrally-directed jet. Tricuspid Valve: Tricuspid valve regurgitation is moderate to severe. Aortic Valve: The aortic valve is tricuspid. There is  mild calcification of the aortic valve. There is moderate thickening of the aortic valve. Aortic valve regurgitation is mild. Pulmonic Valve: The pulmonic valve was normal in structure. Pulmonic valve regurgitation is trivial. Venous: The inferior vena cava is dilated in size with less than 50% respiratory variability, suggesting right atrial pressure of 15  mmHg. RIGHT VENTRICLE TAPSE (M-mode): 1.5 cm AORTIC VALVE LVOT Vmax:   168.00 cm/s LVOT Vmean:  106.000 cm/s LVOT VTI:    0.251 m TRICUSPID VALVE TR Peak grad:   46.2 mmHg TR Vmax:        340.00 cm/s  SHUNTS Systemic VTI: 0.25 m Dani Gobble Croitoru MD Electronically signed by Sanda Klein MD Signature Date/Time: 10/05/2021/3:42:54 PM    Final     Microbiology: Recent Results (from the past 240 hour(s))  Resp Panel by RT-PCR (Flu A&B, Covid) Nasopharyngeal Swab     Status: None   Collection Time: 10/31/21  7:02 PM   Specimen: Nasopharyngeal Swab; Nasopharyngeal(NP) swabs in vial transport medium  Result Value Ref Range Status   SARS Coronavirus 2 by RT PCR NEGATIVE NEGATIVE Final    Comment: (NOTE) SARS-CoV-2 target nucleic acids are NOT DETECTED.  The SARS-CoV-2 RNA is generally detectable in upper respiratory specimens during the acute phase of infection. The lowest concentration of SARS-CoV-2 viral copies this assay can detect is 138 copies/mL. A negative result does not preclude SARS-Cov-2 infection and should not be used as the sole basis for treatment or other patient management decisions. A negative result may occur with  improper specimen collection/handling, submission of specimen other than nasopharyngeal swab, presence of viral mutation(s) within the areas targeted by this assay, and inadequate number of viral copies(<138 copies/mL). A negative result must be combined with clinical observations, patient history, and epidemiological information. The expected result is Negative.  Fact Sheet for Patients:  EntrepreneurPulse.com.au  Fact Sheet for Healthcare Providers:  IncredibleEmployment.be  This test is no t yet approved or cleared by the [place] FDA and  has been authorized for detection and/or diagnosis of SARS-CoV-2 by FDA under an Emergency Use Authorization (EUA). This EUA will remain  in effect (meaning this test can be used) for  the duration of the COVID-19 declaration under Section 564(b)(1) of the Act, 21 U.S.C.section 360bbb-3(b)(1), unless the authorization is terminated  or revoked sooner.       Influenza A by PCR NEGATIVE NEGATIVE Final   Influenza B by PCR NEGATIVE NEGATIVE Final    Comment: (NOTE) The Xpert Xpress SARS-CoV-2/FLU/RSV plus assay is intended as an aid in the diagnosis of influenza from Nasopharyngeal swab specimens and should not be used as a sole basis for treatment. Nasal washings and aspirates are unacceptable for Xpert Xpress SARS-CoV-2/FLU/RSV testing.  Fact Sheet for Patients: EntrepreneurPulse.com.au  Fact Sheet for Healthcare Providers: IncredibleEmployment.be  This test is not yet approved or cleared by the [place] FDA and has been authorized for detection and/or diagnosis of SARS-CoV-2 by FDA under an Emergency Use Authorization (EUA). This EUA will remain in effect (meaning this test can be used) for the duration of the COVID-19 declaration under Section 564(b)(1) of the Act, 21 U.S.C. section 360bbb-3(b)(1), unless the authorization is terminated or revoked.  Performed at [facility], [place] [address], [place], [place] [address]      Labs: Basic Metabolic Panel: Recent Labs  Lab 10/31/21 1459 11/01/21 0304 11/02/21 0439  NA 131* 131* 130*  K 4.8 5.7* 5.1  CL 87* 89* 89*  CO2 25 24  22  GLUCOSE 87 97 82  BUN 48* 61* 50*  CREATININE 6.92* 7.82* 5.51*  CALCIUM 8.9 8.8* 8.3*  MG  --  2.9*  --   PHOS  --  8.7*  --    Liver Function Tests: Recent Labs  Lab 11/01/21 0304  AST 24  ALT 15  ALKPHOS 91  BILITOT 1.1  PROT 7.4  ALBUMIN 2.7*   No results for input(s): LIPASE, AMYLASE in the last 168 hours. No results for input(s): AMMONIA in the last 168 hours. CBC: Recent Labs  Lab 10/31/21 1459 11/01/21 0304  WBC 7.5 7.4  NEUTROABS  --  4.9  HGB 10.2* 9.8*  HCT 31.6* 29.9*  MCV 81.9 81.5  PLT 451*  460*   Cardiac Enzymes: No results for input(s): CKTOTAL, CKMB, CKMBINDEX, TROPONINI in the last 168 hours. BNP: BNP (last 3 results) Recent Labs    10/05/21 0804 11/01/21 0303  BNP >4,500.0* 1,194.0*    ProBNP (last 3 results) No results for input(s): PROBNP in the last 8760 hours.  CBG: No results for input(s): GLUCAP in the last 168 hours.     Signed:  Domenic Polite MD.  [facility] 11/03/2021, 2:20 PM

## 2021-11-04 ENCOUNTER — Emergency Department (HOSPITAL_COMMUNITY): Payer: Medicare (Managed Care)

## 2021-11-04 ENCOUNTER — Inpatient Hospital Stay (HOSPITAL_COMMUNITY)
Admission: EM | Admit: 2021-11-04 | Discharge: 2021-11-09 | DRG: 356 | Disposition: A | Discharge status: Home / Self Care | Payer: Medicare (Managed Care) | Attending: Internal Medicine | Admitting: Internal Medicine

## 2021-11-04 ENCOUNTER — Encounter (HOSPITAL_COMMUNITY): Payer: Self-pay | Admitting: *Deleted

## 2021-11-04 ENCOUNTER — Other Ambulatory Visit: Payer: Self-pay

## 2021-11-04 DIAGNOSIS — D62 Acute posthemorrhagic anemia: Secondary | ICD-10-CM | POA: Diagnosis present

## 2021-11-04 DIAGNOSIS — Z8249 Family history of ischemic heart disease and other diseases of the circulatory system: Secondary | ICD-10-CM

## 2021-11-04 DIAGNOSIS — K922 Gastrointestinal hemorrhage, unspecified: Secondary | ICD-10-CM | POA: Diagnosis present

## 2021-11-04 DIAGNOSIS — D631 Anemia in chronic kidney disease: Secondary | ICD-10-CM | POA: Diagnosis present

## 2021-11-04 DIAGNOSIS — F17210 Nicotine dependence, cigarettes, uncomplicated: Secondary | ICD-10-CM | POA: Diagnosis present

## 2021-11-04 DIAGNOSIS — N186 End stage renal disease: Secondary | ICD-10-CM | POA: Diagnosis present

## 2021-11-04 DIAGNOSIS — Z9049 Acquired absence of other specified parts of digestive tract: Secondary | ICD-10-CM

## 2021-11-04 DIAGNOSIS — D72828 Other elevated white blood cell count: Secondary | ICD-10-CM | POA: Diagnosis present

## 2021-11-04 DIAGNOSIS — I5042 Chronic combined systolic (congestive) and diastolic (congestive) heart failure: Secondary | ICD-10-CM | POA: Diagnosis present

## 2021-11-04 DIAGNOSIS — K264 Chronic or unspecified duodenal ulcer with hemorrhage: Principal | ICD-10-CM

## 2021-11-04 DIAGNOSIS — Z20822 Contact with and (suspected) exposure to covid-19: Secondary | ICD-10-CM | POA: Diagnosis present

## 2021-11-04 DIAGNOSIS — D72829 Elevated white blood cell count, unspecified: Secondary | ICD-10-CM

## 2021-11-04 DIAGNOSIS — I3139 Other pericardial effusion (noninflammatory): Secondary | ICD-10-CM | POA: Diagnosis present

## 2021-11-04 DIAGNOSIS — N400 Enlarged prostate without lower urinary tract symptoms: Secondary | ICD-10-CM | POA: Diagnosis present

## 2021-11-04 DIAGNOSIS — D649 Anemia, unspecified: Secondary | ICD-10-CM

## 2021-11-04 DIAGNOSIS — I429 Cardiomyopathy, unspecified: Secondary | ICD-10-CM | POA: Diagnosis present

## 2021-11-04 DIAGNOSIS — K2100 Gastro-esophageal reflux disease with esophagitis, without bleeding: Secondary | ICD-10-CM | POA: Diagnosis present

## 2021-11-04 DIAGNOSIS — Z9115 Patient's noncompliance with renal dialysis: Secondary | ICD-10-CM

## 2021-11-04 DIAGNOSIS — Z833 Family history of diabetes mellitus: Secondary | ICD-10-CM

## 2021-11-04 DIAGNOSIS — I959 Hypotension, unspecified: Secondary | ICD-10-CM | POA: Diagnosis present

## 2021-11-04 DIAGNOSIS — I132 Hypertensive heart and chronic kidney disease with heart failure and with stage 5 chronic kidney disease, or end stage renal disease: Secondary | ICD-10-CM | POA: Diagnosis present

## 2021-11-04 DIAGNOSIS — Z85038 Personal history of other malignant neoplasm of large intestine: Secondary | ICD-10-CM

## 2021-11-04 DIAGNOSIS — Z992 Dependence on renal dialysis: Secondary | ICD-10-CM

## 2021-11-04 LAB — POC OCCULT BLOOD, ED: Fecal Occult Bld: NEGATIVE

## 2021-11-04 MED ORDER — SODIUM CHLORIDE 0.9 % IV BOLUS (SEPSIS)
500.0000 mL | Freq: Once | INTRAVENOUS | Status: AC
  Administered 2021-11-05: 500 mL via INTRAVENOUS

## 2021-11-04 NOTE — ED Triage Notes (Signed)
Pt arrived with EMS for vomiting blood.Wife called EMS after finding pt on the bathroom floor. Pt reports feeling like he needed to have a BM, Per ems report, large amount of dark red blood found on the floor. Blood noted in patient's mouth. Pt reports last dialysis treatment was on Thursday and completed a full treatment. Fistula noted to L arm. EMS reported BP 70 palpated and no radial pulse

## 2021-11-04 NOTE — ED Provider Notes (Signed)
[facility] EMERGENCY DEPARTMENT Provider Note   CSN: [id number] Arrival date & time: 11/04/21  2341     History  Chief Complaint  Patient presents with   Abdominal Pain   GI Bleeding   Level 5 caveat due to acuity of condition Timothy [name] is a 61 y.o. male.  The history is provided by the patient and the EMS personnel.  Patient presents in distress.  He reports he was going to the bathroom to have a bowel movement, the next thing he knows he was found on the floor. EMS reports the patient was found on the floor with a large amount of blood in the floor.  There was blood noted in his mouth.  Patient was just in the hospital on January 18 for pulmonary edema reports he had dialysis yesterday. He reports having generalized weakness.  He denies any specific pain complaints Course is stable at this time    Home Medications Prior to Admission medications   Medication Sig Start Date End Date Taking? Authorizing Provider  carvedilol (COREG) 6.25 MG tablet Take 1 tablet (6.25 mg total) by mouth 2 (two) times daily with a meal. 10/09/21   Sharen Hones, MD  hydrALAZINE (APRESOLINE) 25 MG tablet Take 1 tablet (25 mg total) by mouth every 8 (eight) hours. DO not take this before dialysis on TTS 11/02/21   Domenic Polite, MD  Oxycodone HCl 10 MG TABS Take 10 mg by mouth 2 (two) times daily as needed (pain). 10/26/21   [provider]  sevelamer carbonate (RENVELA) 800 MG tablet Take 2 tablets by mouth with breakfast, with lunch, and with evening meal. 05/12/21   [provider]      Allergies    Patient has no known allergies.    Review of Systems   Review of Systems  Unable to perform ROS: Acuity of condition   Physical Exam Updated Vital Signs BP 97/63    Pulse 73    Temp 98.1 F (36.7 C) (Oral)    Resp 20    SpO2 95%  Physical Exam CONSTITUTIONAL: Ill-appearing HEAD: Normocephalic/atraumatic, no signs of trauma EYES: EOMI/PERRL ENMT: Mucous  membranes dry, dried blood noted in mouth NECK: supple no meningeal signs SPINE/BACK:entire spine nontender, no bruising/crepitance/stepoffs noted to spine CV: S1/S2 noted LUNGS: Coarse breath sounds bilaterally, no acute distress ABDOMEN: soft, distended, mild diffuse tenderness GU: Normal external genitalia Rectal-no blood or melena, chaperone present NEURO: Pt is awake/alert/appropriate, moves all extremitiesx4.  No facial droop.   EXTREMITIES: pulses normal/equal, full ROM, pelvis stable, no deformities, dialysis access to left arm with thrill noted SKIN: warm, color normal PSYCH: Unable to assess  ED Results / Procedures / Treatments   Labs (all labs ordered are listed, but only abnormal results are displayed) Labs Reviewed  CBC WITH DIFFERENTIAL/PLATELET - Abnormal; Notable for the following components:      Result Value   WBC 11.1 (*)    RBC 2.87 (*)    Hemoglobin 7.8 (*)    HCT 24.0 (*)    RDW 20.8 (*)    Neutro Abs 8.0 (*)    Monocytes Absolute 1.9 (*)    Abs Immature Granulocytes 0.15 (*)    All other components within normal limits  COMPREHENSIVE METABOLIC PANEL - Abnormal; Notable for the following components:   Sodium 134 (*)    Chloride 92 (*)    BUN 57 (*)    Creatinine, Ser 6.62 (*)    Calcium 7.6 (*)  Total Protein 6.4 (*)    Albumin 2.3 (*)    AST 104 (*)    ALT 116 (*)    GFR, Estimated 9 (*)    All other components within normal limits  PROTIME-INR - Abnormal; Notable for the following components:   Prothrombin Time 18.0 (*)    INR 1.5 (*)    All other components within normal limits  I-STAT CHEM 8, ED - Abnormal; Notable for the following components:   Sodium 133 (*)    Chloride 93 (*)    BUN 60 (*)    Creatinine, Ser 6.50 (*)    Calcium, Ion 0.91 (*)    Hemoglobin 9.2 (*)    HCT 27.0 (*)    All other components within normal limits  RESP PANEL BY RT-PCR (FLU A&B, COVID) ARPGX2  ETHANOL  POC OCCULT BLOOD, ED  TYPE AND SCREEN  PREPARE RBC  (CROSSMATCH)    EKG EKG Interpretation  Date/Time:  Saturday November 05 2021 02:10:24 EST Ventricular Rate:  73 PR Interval:  172 QRS Duration: 107 QT Interval:  418 QTC Calculation: 461 R Axis:   23 Text Interpretation: Sinus rhythm Abnormal T, consider ischemia, lateral leads Confirmed by Ripley Fraise [phone]) on 11/05/2021 2:12:41 AM  Radiology CT ABDOMEN PELVIS WO CONTRAST  Result Date: 11/05/2021 CLINICAL DATA:  Status post trauma. EXAM: CT ABDOMEN AND PELVIS WITHOUT CONTRAST TECHNIQUE: Multidetector CT imaging of the abdomen and pelvis was performed following the standard protocol without IV contrast. RADIATION DOSE REDUCTION: This exam was performed according to the departmental dose-optimization program which includes automated exposure control, adjustment of the mA and/or kV according to patient size and/or use of iterative reconstruction technique. COMPARISON:  June 25, 2021 FINDINGS: Lower chest: There is mild cardiomegaly with a large (approximately 1.5 cm) pericardial effusion. This represents a new finding. Diffusely increased attenuation of the myocardium is noted. Mild to moderate severity areas of atelectasis are seen within the bilateral lower lobes. Hepatobiliary: Diffusely increased attenuation of the liver parenchyma is noted no focal liver abnormality is seen. Numerous gallstones are seen within a partially contracted gallbladder, without evidence of gallbladder wall thickening or biliary dilatation. Pancreas: There is mild pancreatic ductal dilatation (approximately 2 mm), without evidence of surrounding inflammation. Spleen: Normal in size without focal abnormality. Adrenals/Urinary Tract: Adrenal glands are unremarkable. Innumerable bilateral renal cysts are seen without obstructing renal calculi or hydronephrosis. The urinary bladder is empty and subsequently limited in evaluation. Stomach/Bowel: Mild to moderate severity diffuse thickening of the distal esophageal  wall is seen. A large amount of ingested material is seen throughout the body of the stomach. Surgically anastomosed bowel is seen within the right anterior aspect of the right upper quadrant and medial aspect of the lower right abdomen, consistent with prior right hemicolectomy. No evidence of bowel dilatation. Vascular/Lymphatic: Aortic atherosclerosis. No enlarged abdominal or pelvic lymph nodes. Reproductive: Prostate is unremarkable. Other: No abdominal wall hernia or abnormality. No abdominopelvic ascites. Musculoskeletal: No acute or significant osseous findings. IMPRESSION: 1. Mild cardiomegaly with a large (approximately 1.5 cm) pericardial effusion. This represents a new finding. 2. Diffusely increased attenuation of the myocardium and liver, consistent with hemochromatosis. 3. Cholelithiasis. 4. Innumerable bilateral renal cysts. 5. Mild to moderate severity diffuse thickening of the distal esophageal wall, which may represent sequelae associated with esophagitis. 6. Findings consistent with prior right hemicolectomy. 7. Aortic atherosclerosis. Aortic Atherosclerosis (ICD10-I70.0). Electronically Signed   By: Virgina Norfolk M.D.   On: 11/05/2021 01:23   CT HEAD  WO CONTRAST (5MM)  Result Date: 11/05/2021 CLINICAL DATA:  Fall, head and neck trauma. EXAM: CT HEAD WITHOUT CONTRAST CT CERVICAL SPINE WITHOUT CONTRAST TECHNIQUE: Multidetector CT imaging of the head and cervical spine was performed following the standard protocol without intravenous contrast. Multiplanar CT image reconstructions of the cervical spine were also generated. RADIATION DOSE REDUCTION: This exam was performed according to the departmental dose-optimization program which includes automated exposure control, adjustment of the mA and/or kV according to patient size and/or use of iterative reconstruction technique. COMPARISON:  None. FINDINGS: CT HEAD FINDINGS Brain: No acute hemorrhage, midline shift or mass effect. No extra-axial  fluid collection. Mild cerebral atrophy is noted. Vascular: No hyperdense vessel or unexpected calcification. Skull: Normal. Negative for fracture or focal lesion. Sinuses/Orbits: No acute finding. Other: None. CT CERVICAL SPINE FINDINGS Alignment: There is mild retrolisthesis at C4-C5. Skull base and vertebrae: No acute fracture. No primary bone lesion or focal pathologic process. Soft tissues and spinal canal: No prevertebral fluid or swelling. No visible canal hematoma. Disc levels: Intervertebral disc space narrowing and degenerative endplate changes are seen predominantly at C4-C5 and C5-C6 resulting in moderate to severe spinal canal and neural foraminal stenosis. Upper chest: Negative. Other: Atherosclerotic calcification of the carotid arteries. IMPRESSION: 1. No acute intracranial process. 2. No acute fracture in the cervical spine. 3. 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 Electronically Signed   By: Brett Fairy M.D.   On: 11/05/2021 01:21   CT Cervical Spine Wo Contrast  Result Date: 11/05/2021 CLINICAL DATA:  Fall, head and neck trauma. EXAM: CT HEAD WITHOUT CONTRAST CT CERVICAL SPINE WITHOUT CONTRAST TECHNIQUE: Multidetector CT imaging of the head and cervical spine was performed following the standard protocol without intravenous contrast. Multiplanar CT image reconstructions of the cervical spine were also generated. RADIATION DOSE REDUCTION: This exam was performed according to the departmental dose-optimization program which includes automated exposure control, adjustment of the mA and/or kV according to patient size and/or use of iterative reconstruction technique. COMPARISON:  None. FINDINGS: CT HEAD FINDINGS Brain: No acute hemorrhage, midline shift or mass effect. No extra-axial fluid collection. Mild cerebral atrophy is noted. Vascular: No hyperdense vessel or unexpected calcification. Skull: Normal. Negative for fracture or focal lesion. Sinuses/Orbits: No acute  finding. Other: None. CT CERVICAL SPINE FINDINGS Alignment: There is mild retrolisthesis at C4-C5. Skull base and vertebrae: No acute fracture. No primary bone lesion or focal pathologic process. Soft tissues and spinal canal: No prevertebral fluid or swelling. No visible canal hematoma. Disc levels: Intervertebral disc space narrowing and degenerative endplate changes are seen predominantly at C4-C5 and C5-C6 resulting in moderate to severe spinal canal and neural foraminal stenosis. Upper chest: Negative. Other: Atherosclerotic calcification of the carotid arteries. IMPRESSION: 1. No acute intracranial process. 2. No acute fracture in the cervical spine. 3. 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 Electronically Signed   By: Brett Fairy M.D.   On: 11/05/2021 01:21   DG Chest Port 1 View  Result Date: 11/05/2021 CLINICAL DATA:  Weakness, vomiting blood. EXAM: PORTABLE CHEST 1 VIEW COMPARISON:  None. FINDINGS: The heart is enlarged the mediastinal contours within normal limits. Atherosclerotic calcification of the aorta is noted. The pulmonary vasculature is distended. Interstitial prominence is noted bilaterally. Subsegmental atelectasis is noted at the right lung base. No effusion or pneumothorax. No acute osseous abnormality. IMPRESSION: 1. Cardiomegaly with pulmonary vascular congestion. 2. Interstitial prominence bilaterally, possible edema or infiltrate. Electronically Signed   By: Mickel Baas  Lovena Le M.D.   On: 11/05/2021 01:22    Procedures .Critical Care Performed by: Ripley Fraise, MD Authorized by: Ripley Fraise, MD   Critical care provider statement:    Critical care time (minutes):  123   Critical care start time:  11/04/2021 11:57 PM   Critical care end time:  11/05/2021 2:00 AM   Critical care time was exclusive of:  Separately billable procedures and treating other patients   Critical care was necessary to treat or prevent imminent or life-threatening deterioration  of the following conditions:  Circulatory failure, dehydration, shock, renal failure and metabolic crisis   Critical care was time spent personally by me on the following activities:  Review of old charts, re-evaluation of patient's condition, pulse oximetry, ordering and review of radiographic studies, ordering and review of laboratory studies, ordering and performing treatments and interventions, discussions with consultants, evaluation of patient's response to treatment, examination of patient, development of treatment plan with patient or surrogate and obtaining history from patient or surrogate   I assumed direction of critical care for this patient from another provider in my specialty: no     Care discussed with: admitting provider      Medications Ordered in ED Medications  0.9 %  sodium chloride infusion (has no administration in time range)  pantoprazole (PROTONIX) 80 mg /NS 100 mL IVPB (has no administration in time range)  pantoprozole (PROTONIX) 80 mg /NS 100 mL infusion (has no administration in time range)  pantoprazole (PROTONIX) injection 40 mg (has no administration in time range)  sodium chloride 0.9 % bolus 500 mL (0 mLs Intravenous Stopped 11/05/21 0156)  fentaNYL (SUBLIMAZE) injection 25 mcg (25 mcg Intravenous Given 11/05/21 0215)  loperamide (IMODIUM) capsule 2 mg (2 mg Oral Given 11/05/21 0217)    ED Course/ Medical Decision Making/ A&P Clinical Course as of 11/05/21 0220  Fri Nov 04, 2021  2358 Patient presents critically ill.  He has blood in his mouth, has been mildly hypotensive.  Unable to contact his wife at this time.  Concern for acute GI bleed.  Labs have been ordered.  He will also need CT head and C-spine due to trauma, as well as abdominal CT scan due to abdominal tenderness [DW]  Sat Nov 05, 2021  0006 Wife is at bedside.  She reports that she saw him actively vomiting blood which is never happened before.  Patient does not recall having this previously.   However he has had SBP and ascites.  He denies any known liver dysfunction [DW]  0006 Creatinine(!): 6.50 Chronic renal failure [DW]  0007 Hemoglobin(!): 9.2 Stable anemia [DW]  0114 Blood pressures have been maintained in the upper 90s.  However he is having active GI bleed at this time.  Blood transfusion has been ordered [DW]  0132 X-ray and CT imaging has been reviewed, no acute findings.  He does have a new pericardial effusion that will be managed as an inpatient [DW]  0220 Discussed with Dr. Alcario Drought for admission to the hospitalist service [DW]  [phone] Multiple attempts have been made at contacting gastroenterology on-call.  There has been no response [DW]    Clinical Course User Index [DW] Ripley Fraise, MD                           Medical Decision Making Amount and/or Complexity of Data Reviewed Labs: ordered. Decision-making details documented in ED Course. Radiology: ordered. ECG/medicine tests: ordered.  Risk Prescription drug  management. Decision regarding hospitalization.   This patient presents to the ED for concern of hypotension, syncope, this involves an extensive number of treatment options, and is a complaint that carries with it a high risk of complications and morbidity.  The differential diagnosis includes cardiac dysrhythmia, acute blood loss anemia, electrolyte abnormality, stroke  Comorbidities that complicate the patient evaluation: Patients presentation is complicated by their history of end-stage renal disease  Social Determinants of Health: Patients previous history of substance abuse increases the complexity of managing their presentation  Additional history obtained: Unable to contact wife via phone Records reviewed previous admission documents  Lab Tests: I Ordered, and personally interpreted labs.  The pertinent results include: Acute on chronic anemia Renal failure  Imaging Studies ordered: I ordered imaging studies including CT scan CT  head, C-spine and abdomen pelvis and X-ray chest x-ray I independently visualized and interpreted imaging which showed multiple chronic issues, but no acute findings I agree with the radiologist interpretation  Cardiac Monitoring: The patient was maintained on a cardiac monitor.  I personally viewed and interpreted the cardiac monitor which showed an underlying rhythm of:  sinus rhythm  Medicines ordered and prescription drug management: I ordered medication including blood products and PPI for acute GI bleed Reevaluation of the patient after these medicines showed that the patient    stayed the same   Critical Interventions:       Blood transfusion  Consultations Obtained: I requested consultation with the admitting physician Dr. Alcario Drought, and discussed  findings as well as pertinent plan - they recommend: Admission  Reevaluation: After the interventions noted above, I reevaluated the patient and found that they have :stayed the same  Complexity of problems addressed: Patients presentation is most consistent with  acute presentation with potential threat to life or bodily function      Disposition: After consideration of the diagnostic results and the patients response to treatment,  I feel that the patent would benefit from admission.   2:23 AM Patient found to have acute GI bleed.  After further discussion, patient now accepts blood products.  His vital signs have been maintaining.  Patient had multiple loose bloody stools here in the emergency department. Multiple attempts have been made at contacting the on-call gastroenterologist.  We will continue to contact GI        Final Clinical Impression(s) / ED Diagnoses Final diagnoses:  Acute GI bleeding  ESRD (end stage renal disease) ([place])  Acute anemia  Pericardial effusion    Rx / DC Orders ED Discharge Orders     None         Ripley Fraise, MD 11/05/21 [phone]

## 2021-11-05 ENCOUNTER — Inpatient Hospital Stay (HOSPITAL_COMMUNITY): Payer: Medicare (Managed Care)

## 2021-11-05 ENCOUNTER — Emergency Department (HOSPITAL_COMMUNITY): Payer: Medicare (Managed Care)

## 2021-11-05 ENCOUNTER — Encounter (HOSPITAL_COMMUNITY): Payer: Self-pay | Admitting: Internal Medicine

## 2021-11-05 ENCOUNTER — Encounter (HOSPITAL_COMMUNITY)
Admission: EM | Disposition: A | Discharge status: Home / Self Care | Payer: Self-pay | Source: Home / Self Care | Attending: Internal Medicine

## 2021-11-05 ENCOUNTER — Inpatient Hospital Stay (HOSPITAL_COMMUNITY): Payer: Medicare (Managed Care) | Admitting: Anesthesiology

## 2021-11-05 DIAGNOSIS — K2100 Gastro-esophageal reflux disease with esophagitis, without bleeding: Secondary | ICD-10-CM | POA: Diagnosis present

## 2021-11-05 DIAGNOSIS — N186 End stage renal disease: Secondary | ICD-10-CM

## 2021-11-05 DIAGNOSIS — K922 Gastrointestinal hemorrhage, unspecified: Secondary | ICD-10-CM | POA: Diagnosis not present

## 2021-11-05 DIAGNOSIS — Z8249 Family history of ischemic heart disease and other diseases of the circulatory system: Secondary | ICD-10-CM | POA: Diagnosis not present

## 2021-11-05 DIAGNOSIS — K920 Hematemesis: Secondary | ICD-10-CM | POA: Diagnosis present

## 2021-11-05 DIAGNOSIS — Z9115 Patient's noncompliance with renal dialysis: Secondary | ICD-10-CM | POA: Diagnosis not present

## 2021-11-05 DIAGNOSIS — I5042 Chronic combined systolic (congestive) and diastolic (congestive) heart failure: Secondary | ICD-10-CM

## 2021-11-05 DIAGNOSIS — Z833 Family history of diabetes mellitus: Secondary | ICD-10-CM | POA: Diagnosis not present

## 2021-11-05 DIAGNOSIS — Z992 Dependence on renal dialysis: Secondary | ICD-10-CM | POA: Diagnosis not present

## 2021-11-05 DIAGNOSIS — Z20822 Contact with and (suspected) exposure to covid-19: Secondary | ICD-10-CM | POA: Diagnosis present

## 2021-11-05 DIAGNOSIS — I959 Hypotension, unspecified: Secondary | ICD-10-CM | POA: Diagnosis present

## 2021-11-05 DIAGNOSIS — K264 Chronic or unspecified duodenal ulcer with hemorrhage: Secondary | ICD-10-CM | POA: Diagnosis present

## 2021-11-05 DIAGNOSIS — D631 Anemia in chronic kidney disease: Secondary | ICD-10-CM | POA: Diagnosis present

## 2021-11-05 DIAGNOSIS — Z9049 Acquired absence of other specified parts of digestive tract: Secondary | ICD-10-CM | POA: Diagnosis not present

## 2021-11-05 DIAGNOSIS — I3139 Other pericardial effusion (noninflammatory): Secondary | ICD-10-CM

## 2021-11-05 DIAGNOSIS — I132 Hypertensive heart and chronic kidney disease with heart failure and with stage 5 chronic kidney disease, or end stage renal disease: Secondary | ICD-10-CM | POA: Diagnosis present

## 2021-11-05 DIAGNOSIS — D62 Acute posthemorrhagic anemia: Secondary | ICD-10-CM | POA: Diagnosis present

## 2021-11-05 DIAGNOSIS — N400 Enlarged prostate without lower urinary tract symptoms: Secondary | ICD-10-CM | POA: Diagnosis present

## 2021-11-05 DIAGNOSIS — Z85038 Personal history of other malignant neoplasm of large intestine: Secondary | ICD-10-CM | POA: Diagnosis not present

## 2021-11-05 DIAGNOSIS — I429 Cardiomyopathy, unspecified: Secondary | ICD-10-CM | POA: Diagnosis present

## 2021-11-05 DIAGNOSIS — F17210 Nicotine dependence, cigarettes, uncomplicated: Secondary | ICD-10-CM | POA: Diagnosis present

## 2021-11-05 DIAGNOSIS — D72828 Other elevated white blood cell count: Secondary | ICD-10-CM | POA: Diagnosis present

## 2021-11-05 HISTORY — PX: HEMOSTASIS CONTROL: SHX6838

## 2021-11-05 HISTORY — PX: IR EMBO ART  VEN HEMORR LYMPH EXTRAV  INC GUIDE ROADMAPPING: IMG5450

## 2021-11-05 HISTORY — PX: SCLEROTHERAPY: SHX6841

## 2021-11-05 HISTORY — PX: IR ANGIOGRAM VISCERAL SELECTIVE: IMG657

## 2021-11-05 HISTORY — PX: IR US GUIDE VASC ACCESS RIGHT: IMG2390

## 2021-11-05 HISTORY — PX: ESOPHAGOGASTRODUODENOSCOPY (EGD) WITH PROPOFOL: SHX5813

## 2021-11-05 LAB — POCT I-STAT, CHEM 8
BUN: 82 mg/dL — ABNORMAL HIGH (ref 6–20)
Calcium, Ion: 0.9 mmol/L — ABNORMAL LOW (ref 1.15–1.40)
Chloride: 95 mmol/L — ABNORMAL LOW (ref 98–111)
Creatinine, Ser: 5.9 mg/dL — ABNORMAL HIGH (ref 0.61–1.24)
Glucose, Bld: 118 mg/dL — ABNORMAL HIGH (ref 70–99)
HCT: 23 % — ABNORMAL LOW (ref 39.0–52.0)
Hemoglobin: 7.8 g/dL — ABNORMAL LOW (ref 13.0–17.0)
Potassium: 5 mmol/L (ref 3.5–5.1)
Sodium: 133 mmol/L — ABNORMAL LOW (ref 135–145)
TCO2: 27 mmol/L (ref 22–32)

## 2021-11-05 LAB — COMPREHENSIVE METABOLIC PANEL
ALT: 116 U/L — ABNORMAL HIGH (ref 0–44)
AST: 104 U/L — ABNORMAL HIGH (ref 15–41)
Albumin: 2.3 g/dL — ABNORMAL LOW (ref 3.5–5.0)
Alkaline Phosphatase: 75 U/L (ref 38–126)
Anion gap: 14 (ref 5–15)
BUN: 57 mg/dL — ABNORMAL HIGH (ref 6–20)
CO2: 28 mmol/L (ref 22–32)
Calcium: 7.6 mg/dL — ABNORMAL LOW (ref 8.9–10.3)
Chloride: 92 mmol/L — ABNORMAL LOW (ref 98–111)
Creatinine, Ser: 6.62 mg/dL — ABNORMAL HIGH (ref 0.61–1.24)
GFR, Estimated: 9 mL/min — ABNORMAL LOW (ref >60)
Glucose, Bld: 89 mg/dL (ref 70–99)
Potassium: 4.2 mmol/L (ref 3.5–5.1)
Sodium: 134 mmol/L — ABNORMAL LOW (ref 135–145)
Total Bilirubin: 0.9 mg/dL (ref 0.3–1.2)
Total Protein: 6.4 g/dL — ABNORMAL LOW (ref 6.5–8.1)

## 2021-11-05 LAB — CBC WITH DIFFERENTIAL/PLATELET
Abs Immature Granulocytes: 0.15 10*3/uL — ABNORMAL HIGH (ref 0.00–0.07)
Basophils Absolute: 0 10*3/uL (ref 0.0–0.1)
Basophils Relative: 0 %
Eosinophils Absolute: 0 10*3/uL (ref 0.0–0.5)
Eosinophils Relative: 0 %
HCT: 24 % — ABNORMAL LOW (ref 39.0–52.0)
Hemoglobin: 7.8 g/dL — ABNORMAL LOW (ref 13.0–17.0)
Immature Granulocytes: 1 %
Lymphocytes Relative: 9 %
Lymphs Abs: 1 10*3/uL (ref 0.7–4.0)
MCH: 27.2 pg (ref 26.0–34.0)
MCHC: 32.5 g/dL (ref 30.0–36.0)
MCV: 83.6 fL (ref 80.0–100.0)
Monocytes Absolute: 1.9 10*3/uL — ABNORMAL HIGH (ref 0.1–1.0)
Monocytes Relative: 17 %
Neutro Abs: 8 10*3/uL — ABNORMAL HIGH (ref 1.7–7.7)
Neutrophils Relative %: 73 %
Platelets: 310 10*3/uL (ref 150–400)
RBC: 2.87 MIL/uL — ABNORMAL LOW (ref 4.22–5.81)
RDW: 20.8 % — ABNORMAL HIGH (ref 11.5–15.5)
WBC: 11.1 10*3/uL — ABNORMAL HIGH (ref 4.0–10.5)
nRBC: 0.2 % (ref 0.0–0.2)

## 2021-11-05 LAB — BASIC METABOLIC PANEL
Anion gap: 11 (ref 5–15)
BUN: 79 mg/dL — ABNORMAL HIGH (ref 6–20)
CO2: 25 mmol/L (ref 22–32)
Calcium: 6.8 mg/dL — ABNORMAL LOW (ref 8.9–10.3)
Chloride: 97 mmol/L — ABNORMAL LOW (ref 98–111)
Creatinine, Ser: 6.11 mg/dL — ABNORMAL HIGH (ref 0.61–1.24)
GFR, Estimated: 10 mL/min — ABNORMAL LOW (ref >60)
Glucose, Bld: 118 mg/dL — ABNORMAL HIGH (ref 70–99)
Potassium: 4.5 mmol/L (ref 3.5–5.1)
Sodium: 133 mmol/L — ABNORMAL LOW (ref 135–145)

## 2021-11-05 LAB — RESP PANEL BY RT-PCR (FLU A&B, COVID) ARPGX2
Influenza A by PCR: NEGATIVE
Influenza B by PCR: NEGATIVE
SARS Coronavirus 2 by RT PCR: NEGATIVE

## 2021-11-05 LAB — HEMOGLOBIN AND HEMATOCRIT, BLOOD
HCT: 21.8 % — ABNORMAL LOW (ref 39.0–52.0)
HCT: 21.2 % — ABNORMAL LOW (ref 39.0–52.0)
HCT: 22.3 % — ABNORMAL LOW (ref 39.0–52.0)
Hemoglobin: 7.4 g/dL — ABNORMAL LOW (ref 13.0–17.0)
Hemoglobin: 7.5 g/dL — ABNORMAL LOW (ref 13.0–17.0)
Hemoglobin: 7.5 g/dL — ABNORMAL LOW (ref 13.0–17.0)

## 2021-11-05 LAB — POCT I-STAT 7, (LYTES, BLD GAS, ICA,H+H)
Acid-Base Excess: 3 mmol/L — ABNORMAL HIGH (ref 0.0–2.0)
Acid-Base Excess: 2 mmol/L (ref 0.0–2.0)
Bicarbonate: 27.2 mmol/L (ref 20.0–28.0)
Bicarbonate: 27.1 mmol/L (ref 20.0–28.0)
Calcium, Ion: 0.94 mmol/L — ABNORMAL LOW (ref 1.15–1.40)
Calcium, Ion: 0.94 mmol/L — ABNORMAL LOW (ref 1.15–1.40)
HCT: 19 % — ABNORMAL LOW (ref 39.0–52.0)
HCT: 22 % — ABNORMAL LOW (ref 39.0–52.0)
Hemoglobin: 6.5 g/dL — CL (ref 13.0–17.0)
Hemoglobin: 7.5 g/dL — ABNORMAL LOW (ref 13.0–17.0)
O2 Saturation: 100 %
O2 Saturation: 96 %
Patient temperature: 37
Patient temperature: 37
Potassium: 4.6 mmol/L (ref 3.5–5.1)
Potassium: 4.6 mmol/L (ref 3.5–5.1)
Sodium: 134 mmol/L — ABNORMAL LOW (ref 135–145)
Sodium: 134 mmol/L — ABNORMAL LOW (ref 135–145)
TCO2: 28 mmol/L (ref 22–32)
TCO2: 28 mmol/L (ref 22–32)
pCO2 arterial: 39.8 mmHg (ref 32.0–48.0)
pCO2 arterial: 44.4 mmHg (ref 32.0–48.0)
pH, Arterial: 7.443 (ref 7.350–7.450)
pH, Arterial: 7.394 (ref 7.350–7.450)
pO2, Arterial: 271 mmHg — ABNORMAL HIGH (ref 83.0–108.0)
pO2, Arterial: 82 mmHg — ABNORMAL LOW (ref 83.0–108.0)

## 2021-11-05 LAB — PROTIME-INR
INR: 1.5 — ABNORMAL HIGH (ref 0.8–1.2)
Prothrombin Time: 18 seconds — ABNORMAL HIGH (ref 11.4–15.2)

## 2021-11-05 LAB — I-STAT CHEM 8, ED
BUN: 60 mg/dL — ABNORMAL HIGH (ref 6–20)
Calcium, Ion: 0.91 mmol/L — ABNORMAL LOW (ref 1.15–1.40)
Chloride: 93 mmol/L — ABNORMAL LOW (ref 98–111)
Creatinine, Ser: 6.5 mg/dL — ABNORMAL HIGH (ref 0.61–1.24)
Glucose, Bld: 85 mg/dL (ref 70–99)
HCT: 27 % — ABNORMAL LOW (ref 39.0–52.0)
Hemoglobin: 9.2 g/dL — ABNORMAL LOW (ref 13.0–17.0)
Potassium: 4.2 mmol/L (ref 3.5–5.1)
Sodium: 133 mmol/L — ABNORMAL LOW (ref 135–145)
TCO2: 31 mmol/L (ref 22–32)

## 2021-11-05 LAB — PREPARE RBC (CROSSMATCH)

## 2021-11-05 LAB — GLUCOSE, CAPILLARY
Glucose-Capillary: 112 mg/dL — ABNORMAL HIGH (ref 70–99)
Glucose-Capillary: 98 mg/dL (ref 70–99)
Glucose-Capillary: 97 mg/dL (ref 70–99)
Glucose-Capillary: 90 mg/dL (ref 70–99)
Glucose-Capillary: 90 mg/dL (ref 70–99)

## 2021-11-05 LAB — ETHANOL: Alcohol, Ethyl (B): <10 mg/dL (ref <10)

## 2021-11-05 LAB — MRSA NEXT GEN BY PCR, NASAL: MRSA by PCR Next Gen: NOT DETECTED

## 2021-11-05 SURGERY — ESOPHAGOGASTRODUODENOSCOPY (EGD) WITH PROPOFOL
Anesthesia: General

## 2021-11-05 MED ORDER — VASOPRESSIN 20 UNIT/ML IV SOLN
INTRAVENOUS | Status: DC | PRN
  Administered 2021-11-05: 2 [IU] via INTRAVENOUS
  Administered 2021-11-05: 1 [IU] via INTRAVENOUS
  Administered 2021-11-05: 2 [IU] via INTRAVENOUS
  Administered 2021-11-05: 1 [IU] via INTRAVENOUS

## 2021-11-05 MED ORDER — ONDANSETRON HCL 4 MG/2ML IJ SOLN
INTRAMUSCULAR | Status: DC | PRN
  Administered 2021-11-05: 4 mg via INTRAVENOUS

## 2021-11-05 MED ORDER — PROPOFOL 1000 MG/100ML IV EMUL
0.0000 ug/kg/min | INTRAVENOUS | Status: DC
  Administered 2021-11-05: 35 ug/kg/min via INTRAVENOUS
  Administered 2021-11-05 – 2021-11-06 (×4): 50 ug/kg/min via INTRAVENOUS
  Filled 2021-11-05 (×5): qty 100

## 2021-11-05 MED ORDER — POLYETHYLENE GLYCOL 3350 17 G PO PACK: 17.0000 g | PACK | Freq: Every day | ORAL | Status: DC

## 2021-11-05 MED ORDER — FENTANYL CITRATE (PF) 100 MCG/2ML IJ SOLN: 25.0000 ug | INTRAMUSCULAR | Status: DC | PRN

## 2021-11-05 MED ORDER — LIDOCAINE HCL (CARDIAC) PF 100 MG/5ML IV SOSY
PREFILLED_SYRINGE | INTRAVENOUS | Status: DC | PRN
  Administered 2021-11-05: 60 mg via INTRATRACHEAL

## 2021-11-05 MED ORDER — DOCUSATE SODIUM 50 MG/5ML PO LIQD: 100.0000 mg | Freq: Two times a day (BID) | ORAL | Status: DC

## 2021-11-05 MED ORDER — FENTANYL CITRATE (PF) 100 MCG/2ML IJ SOLN: 50.0000 ug | INTRAMUSCULAR | Status: DC | PRN

## 2021-11-05 MED ORDER — SODIUM CHLORIDE 0.9 % IV SOLN: 10.0000 mL/h | Freq: Once | INTRAVENOUS | Status: DC

## 2021-11-05 MED ORDER — PANTOPRAZOLE SODIUM 40 MG IV SOLR: 40.0000 mg | Freq: Two times a day (BID) | INTRAVENOUS | Status: DC

## 2021-11-05 MED ORDER — PROMETHAZINE HCL 25 MG/ML IJ SOLN: 6.2500 mg | INTRAMUSCULAR | Status: DC | PRN

## 2021-11-05 MED ORDER — ONDANSETRON HCL 4 MG PO TABS: 4.0000 mg | ORAL_TABLET | Freq: Four times a day (QID) | ORAL | Status: DC | PRN

## 2021-11-05 MED ORDER — FENTANYL CITRATE PF 50 MCG/ML IJ SOSY
25.0000 ug | PREFILLED_SYRINGE | Freq: Once | INTRAMUSCULAR | Status: AC
  Administered 2021-11-05: 25 ug via INTRAVENOUS
  Filled 2021-11-05: qty 1

## 2021-11-05 MED ORDER — GELATIN ABSORBABLE 12-7 MM EX MISC
CUTANEOUS | Status: AC
  Filled 2021-11-05: qty 1

## 2021-11-05 MED ORDER — CHLORHEXIDINE GLUCONATE 0.12% ORAL RINSE (MEDLINE KIT)
15.0000 mL | Freq: Two times a day (BID) | OROMUCOSAL | Status: DC
  Administered 2021-11-05 – 2021-11-06 (×2): 15 mL via OROMUCOSAL

## 2021-11-05 MED ORDER — SODIUM CHLORIDE 0.9% IV SOLUTION: Freq: Once | INTRAVENOUS | Status: DC

## 2021-11-05 MED ORDER — ONDANSETRON HCL 4 MG/2ML IJ SOLN: 4.0000 mg | Freq: Four times a day (QID) | INTRAMUSCULAR | Status: DC | PRN

## 2021-11-05 MED ORDER — ACETAMINOPHEN 325 MG PO TABS
650.0000 mg | ORAL_TABLET | Freq: Four times a day (QID) | ORAL | Status: DC | PRN
  Administered 2021-11-08 – 2021-11-09 (×3): 650 mg via ORAL
  Filled 2021-11-05 (×3): qty 2

## 2021-11-05 MED ORDER — EPINEPHRINE 1 MG/10ML IJ SOSY
PREFILLED_SYRINGE | INTRAMUSCULAR | Status: AC
  Filled 2021-11-05: qty 10

## 2021-11-05 MED ORDER — ORAL CARE MOUTH RINSE
15.0000 mL | OROMUCOSAL | Status: DC
  Administered 2021-11-05 – 2021-11-06 (×5): 15 mL via OROMUCOSAL

## 2021-11-05 MED ORDER — SODIUM CHLORIDE 0.9 % IV SOLN
250.0000 mg | Freq: Once | INTRAVENOUS | Status: DC
  Filled 2021-11-05: qty 5

## 2021-11-05 MED ORDER — ACETAMINOPHEN 650 MG RE SUPP: 650.0000 mg | Freq: Four times a day (QID) | RECTAL | Status: DC | PRN

## 2021-11-05 MED ORDER — SODIUM CHLORIDE (PF) 0.9 % IJ SOLN
PREFILLED_SYRINGE | INTRAMUSCULAR | Status: DC | PRN
  Administered 2021-11-05: 3 mL

## 2021-11-05 MED ORDER — PANTOPRAZOLE INFUSION (NEW) - SIMPLE MED
8.0000 mg/h | INTRAVENOUS | Status: DC
  Administered 2021-11-05 – 2021-11-06 (×4): 8 mg/h via INTRAVENOUS
  Filled 2021-11-05: qty 100
  Filled 2021-11-05 (×4): qty 80

## 2021-11-05 MED ORDER — PANTOPRAZOLE 80MG IVPB - SIMPLE MED
80.0000 mg | Freq: Once | INTRAVENOUS | Status: AC
  Administered 2021-11-05: 80 mg via INTRAVENOUS
  Filled 2021-11-05: qty 80
  Filled 2021-11-05: qty 100

## 2021-11-05 MED ORDER — CHLORHEXIDINE GLUCONATE CLOTH 2 % EX PADS
6.0000 | MEDICATED_PAD | Freq: Every day | CUTANEOUS | Status: DC
  Administered 2021-11-05 – 2021-11-08 (×3): 6 via TOPICAL

## 2021-11-05 MED ORDER — IOHEXOL 300 MG/ML  SOLN
100.0000 mL | Freq: Once | INTRAMUSCULAR | Status: AC | PRN
  Administered 2021-11-05: 45 mL via INTRAVENOUS

## 2021-11-05 MED ORDER — PROPOFOL 10 MG/ML IV BOLUS
INTRAVENOUS | Status: DC | PRN
  Administered 2021-11-05: 90 mg via INTRAVENOUS
  Administered 2021-11-05: 20 mg via INTRAVENOUS

## 2021-11-05 MED ORDER — FENTANYL CITRATE PF 50 MCG/ML IJ SOSY: 50.0000 ug | PREFILLED_SYRINGE | Freq: Once | INTRAMUSCULAR | Status: DC

## 2021-11-05 MED ORDER — OXYCODONE HCL 5 MG/5ML PO SOLN: 5.0000 mg | Freq: Once | ORAL | Status: DC | PRN

## 2021-11-05 MED ORDER — SODIUM CHLORIDE 0.9 % IV SOLN
INTRAVENOUS | Status: DC | PRN
Start: 2021-11-05 — End: 2021-11-05

## 2021-11-05 MED ORDER — ROCURONIUM BROMIDE 100 MG/10ML IV SOLN
INTRAVENOUS | Status: DC | PRN
  Administered 2021-11-05: 40 mg via INTRAVENOUS

## 2021-11-05 MED ORDER — SUCCINYLCHOLINE CHLORIDE 200 MG/10ML IV SOSY
PREFILLED_SYRINGE | INTRAVENOUS | Status: DC | PRN
  Administered 2021-11-05: 100 mg via INTRAVENOUS

## 2021-11-05 MED ORDER — PROPOFOL 500 MG/50ML IV EMUL
INTRAVENOUS | Status: DC | PRN
  Administered 2021-11-05: 35 ug/kg/min via INTRAVENOUS

## 2021-11-05 MED ORDER — OXYCODONE HCL 5 MG PO TABS: 5.0000 mg | ORAL_TABLET | Freq: Once | ORAL | Status: DC | PRN

## 2021-11-05 MED ORDER — SODIUM CHLORIDE 0.9 % IV SOLN: INTRAVENOUS | Status: DC

## 2021-11-05 MED ORDER — LOPERAMIDE HCL 2 MG PO CAPS
2.0000 mg | ORAL_CAPSULE | Freq: Once | ORAL | Status: AC
  Administered 2021-11-05: 2 mg via ORAL
  Filled 2021-11-05: qty 1

## 2021-11-05 MED ORDER — FENTANYL CITRATE PF 50 MCG/ML IJ SOSY
25.0000 ug | PREFILLED_SYRINGE | INTRAMUSCULAR | Status: DC | PRN
  Filled 2021-11-05: qty 1

## 2021-11-05 SURGICAL SUPPLY — 15 items

## 2021-11-05 NOTE — H&P (Addendum)
History and Physical    Timothy [name] TFT:[id number] DOB: 1960-12-06 DOA: 11/04/2021  PCP: Timothy Mccreedy, MD  Patient coming from: Home  I have personally briefly reviewed patient's old medical records in [place]  Chief Complaint: Syncope, hematemesis  HPI: Timothy [name] is a 61 y.o. male with medical history significant of ESRD, HTN, heroin abuse, HFrEF: EF 40-45% in Dec with small pericardial effusion at that time.  Pt admitted earlier this week (1/16-1/18) for volume overload due to cutting multiple dialysis sessions short.  Most recent dialysis was yesterday.  At home earlier this evening, was going to bathroom.  Had syncope.  Wife witnessed him vomiting up blood, pt woke up on bathroom floor in puddle of bloody vomit.  EMS called.  Initial SBP 70.  Got some amount of IVF with EMS.  In to ED.   ED Course: In ED pt had very large melanotic bowel movement.  HGB has dropped 2g in 3 days to 7.8.  2u PRBC transfusion ordered.   Past Medical History:  Diagnosis Date   Anemia of chronic kidney failure    BPH (benign prostatic hyperplasia)    Colon cancer ([place]) 2014   End stage renal disease on dialysis ([place]) 2017   Hypertension    Hypertensive heart disease with chronic diastolic congestive heart failure ([place]) 07/17/2016   Polysubstance abuse ([place])    History of heroin and marijuana use    Past Surgical History:  Procedure Laterality Date   ABDOMINAL SURGERY     AV FISTULA PLACEMENT Left 09/19/2016   Procedure: Left arm Radiocephalic ARTERIOVENOUS (AV) FISTULA CREATION;  Surgeon: Conrad Marne, MD;  Location: [facility];  Service: Vascular;  Laterality: Left;   BASCILIC VEIN TRANSPOSITION Left 07/09/2017   Procedure: BRACHIOCEPHALIC FISTULA CREATION;  Surgeon: Conrad Barstow, MD;  Location: [facility];  Service: Vascular;  Laterality: Left;   COLON SURGERY  2014   INSERTION OF DIALYSIS CATHETER N/A 09/19/2016   Procedure: INSERTION OF TUNNELED DIALYSIS CATHETER;   Surgeon: Conrad , MD;  Location: [facility];  Service: Vascular;  Laterality: N/A;   IR GENERIC HISTORICAL  09/14/2016   IR US GUIDE VASC ACCESS RIGHT 09/14/2016 [name], DO [facility]   IR GENERIC HISTORICAL  09/14/2016   IR FLUORO GUIDE CV LINE RIGHT 09/14/2016 [name], DO [facility]   IR PARACENTESIS  06/22/2021   LAPAROTOMY N/A 05/23/2021   Procedure: EXPLORATORY LAPAROTOMY LYSIS ADHESIONS;  Surgeon: [name], MD;  Location: [facility];  Service: General;  Laterality: N/A;   ORIF TIBIA PLATEAU Left 01/15/2018   Procedure: OPEN REDUCTION INTERNAL FIXATION (ORIF) TIBIAL PLATEAU;  Surgeon: Altamese Dorchester, MD;  Location: [facility];  Service: Orthopedics;  Laterality: Left;   TRANSTHORACIC ECHOCARDIOGRAM  07/2016    EF 60-65%, No RWMA. Mod Concentric LVH - Gr 2 DD. Severe LA dilation. PAP ~35 mmHg (mild Pulm HTN)  --> no changes noted 1 month later     reports that he has been smoking cigarettes. He has been smoking an average of .25 packs per day. He has never used smokeless tobacco. He reports current drug use. Frequency: 1.00 time per week. Drugs: Marijuana and Heroin. He reports that he does not drink alcohol.  No Known Allergies  Family History  Problem Relation Age of Onset   Heart failure Mother        Died at age 13.   Heart attack Mother 58   Hypertension Mother    Diabetes Mellitus II Mother  Other Father        Unknown   Kidney failure Sister        (Oldest Sister)   Other Other        Multiple siblings have started her heart disease, he is not sure of the details.   CAD Nephew     Prior to Admission medications   Medication Sig Start Date End Date Taking? Authorizing Provider  carvedilol (COREG) 6.25 MG tablet Take 1 tablet (6.25 mg total) by mouth 2 (two) times daily with a meal. 10/09/21   Sharen Hones, MD  hydrALAZINE (APRESOLINE) 25 MG tablet Take 1 tablet (25 mg total) by mouth every 8 (eight) hours. DO not take this before dialysis on TTS 11/02/21    Domenic Polite, MD  Oxycodone HCl 10 MG TABS Take 10 mg by mouth 2 (two) times daily as needed (pain). 10/26/21   [provider]  sevelamer carbonate (RENVELA) 800 MG tablet Take 2 tablets by mouth with breakfast, with lunch, and with evening meal. 05/12/21   [provider]    Physical Exam: Vitals:   11/05/21 0226 11/05/21 0227 11/05/21 0230 11/05/21 0245  BP: (!) 91/54  104/67 93/64  Pulse: 74 77 73 73  Resp: 18 (!) 21 (!) 28 12  Temp:      TempSrc:      SpO2: 94% 97% 94% 94%    Constitutional: NAD, calm, chronically ill appearing Eyes: PERRL, lids and conjunctivae normal ENMT: Mucous membranes are moist. Posterior pharynx clear of any exudate or lesions.Normal dentition.  Neck: normal, supple, no masses, no thyromegaly Respiratory: clear to auscultation bilaterally, no wheezing, no crackles. Normal respiratory effort. No accessory muscle use.  Cardiovascular: Regular rate and rhythm, Couldn't appreciate rub No extremity edema. 2+ pedal pulses. No carotid bruits.  Abdomen: no tenderness, no masses palpated. No hepatosplenomegaly. Bowel sounds positive.  Musculoskeletal: no clubbing / cyanosis. No joint deformity upper and lower extremities. Good ROM, no contractures. Normal muscle tone.  Skin: no rashes, lesions, ulcers. No induration Neurologic: CN 2-12 grossly intact. Sensation intact, DTR normal. Strength 5/5 in all 4.  Psychiatric: Normal judgment and insight. Alert and oriented x 3. Normal mood.    Labs on Admission: I have personally reviewed following labs and imaging studies  CBC: Recent Labs  Lab 10/31/21 1459 11/01/21 0304 11/04/21 2350 11/04/21 2358  WBC 7.5 7.4 11.1*  --   NEUTROABS  --  4.9 8.0*  --   HGB 10.2* 9.8* 7.8* 9.2*  HCT 31.6* 29.9* 24.0* 27.0*  MCV 81.9 81.5 83.6  --   PLT 451* 460* 310  --    Basic Metabolic Panel: Recent Labs  Lab 10/31/21 1459 11/01/21 0304 11/02/21 0439 11/04/21 2350 11/04/21 2358  NA 131* 131* 130*  134* 133*  K 4.8 5.7* 5.1 4.2 4.2  CL 87* 89* 89* 92* 93*  CO2 25 24 22 28   --   GLUCOSE 87 97 82 89 85  BUN 48* 61* 50* 57* 60*  CREATININE 6.92* 7.82* 5.51* 6.62* 6.50*  CALCIUM 8.9 8.8* 8.3* 7.6*  --   MG  --  2.9*  --   --   --   PHOS  --  8.7*  --   --   --    GFR: Estimated Creatinine Clearance: 11.4 mL/min (A) (by C-G formula based on SCr of 6.5 mg/dL (H)). Liver Function Tests: Recent Labs  Lab 11/01/21 0304 11/04/21 2350  AST 24 104*  ALT 15 116*  ALKPHOS  91 75  BILITOT 1.1 0.9  PROT 7.4 6.4*  ALBUMIN 2.7* 2.3*   No results for input(s): LIPASE, AMYLASE in the last 168 hours. No results for input(s): AMMONIA in the last 168 hours. Coagulation Profile: Recent Labs  Lab 11/04/21 2350  INR 1.5*   Cardiac Enzymes: No results for input(s): CKTOTAL, CKMB, CKMBINDEX, TROPONINI in the last 168 hours. BNP (last 3 results) No results for input(s): PROBNP in the last 8760 hours. HbA1C: No results for input(s): HGBA1C in the last 72 hours. CBG: No results for input(s): GLUCAP in the last 168 hours. Lipid Profile: No results for input(s): CHOL, HDL, LDLCALC, TRIG, CHOLHDL, LDLDIRECT in the last 72 hours. Thyroid Function Tests: No results for input(s): TSH, T4TOTAL, FREET4, T3FREE, THYROIDAB in the last 72 hours. Anemia Panel: No results for input(s): VITAMINB12, FOLATE, FERRITIN, TIBC, IRON, RETICCTPCT in the last 72 hours. Urine analysis:    Component Value Date/Time   COLORURINE YELLOW 05/19/2021 2015   APPEARANCEUR HAZY (A) 05/19/2021 2015   LABSPEC 1.012 05/19/2021 2015   PHURINE 9.0 (H) 05/19/2021 2015   GLUCOSEU NEGATIVE 05/19/2021 2015   HGBUR NEGATIVE 05/19/2021 2015   BILIRUBINUR NEGATIVE 05/19/2021 2015   [place] 05/19/2021 2015   PROTEINUR >=300 (A) 05/19/2021 2015   NITRITE NEGATIVE 05/19/2021 2015   LEUKOCYTESUR SMALL (A) 05/19/2021 2015    Radiological Exams on Admission: CT ABDOMEN PELVIS WO CONTRAST  Result Date:  11/05/2021 CLINICAL DATA:  Status post trauma. EXAM: CT ABDOMEN AND PELVIS WITHOUT CONTRAST TECHNIQUE: Multidetector CT imaging of the abdomen and pelvis was performed following the standard protocol without IV contrast. RADIATION DOSE REDUCTION: This exam was performed according to the departmental dose-optimization program which includes automated exposure control, adjustment of the mA and/or kV according to patient size and/or use of iterative reconstruction technique. COMPARISON:  June 25, 2021 FINDINGS: Lower chest: There is mild cardiomegaly with a large (approximately 1.5 cm) pericardial effusion. This represents a new finding. Diffusely increased attenuation of the myocardium is noted. Mild to moderate severity areas of atelectasis are seen within the bilateral lower lobes. Hepatobiliary: Diffusely increased attenuation of the liver parenchyma is noted no focal liver abnormality is seen. Numerous gallstones are seen within a partially contracted gallbladder, without evidence of gallbladder wall thickening or biliary dilatation. Pancreas: There is mild pancreatic ductal dilatation (approximately 2 mm), without evidence of surrounding inflammation. Spleen: Normal in size without focal abnormality. Adrenals/Urinary Tract: Adrenal glands are unremarkable. Innumerable bilateral renal cysts are seen without obstructing renal calculi or hydronephrosis. The urinary bladder is empty and subsequently limited in evaluation. Stomach/Bowel: Mild to moderate severity diffuse thickening of the distal esophageal wall is seen. A large amount of ingested material is seen throughout the body of the stomach. Surgically anastomosed bowel is seen within the right anterior aspect of the right upper quadrant and medial aspect of the lower right abdomen, consistent with prior right hemicolectomy. No evidence of bowel dilatation. Vascular/Lymphatic: Aortic atherosclerosis. No enlarged abdominal or pelvic lymph nodes.  Reproductive: Prostate is unremarkable. Other: No abdominal wall hernia or abnormality. No abdominopelvic ascites. Musculoskeletal: No acute or significant osseous findings. IMPRESSION: 1. Mild cardiomegaly with a large (approximately 1.5 cm) pericardial effusion. This represents a new finding. 2. Diffusely increased attenuation of the myocardium and liver, consistent with hemochromatosis. 3. Cholelithiasis. 4. Innumerable bilateral renal cysts. 5. Mild to moderate severity diffuse thickening of the distal esophageal wall, which may represent sequelae associated with esophagitis. 6. Findings consistent with prior right hemicolectomy. 7. Aortic  atherosclerosis. Aortic Atherosclerosis (ICD10-I70.0). Electronically Signed   By: Virgina Norfolk M.D.   On: 11/05/2021 01:23   CT HEAD WO CONTRAST (5MM)  Result Date: 11/05/2021 CLINICAL DATA:  Fall, head and neck trauma. EXAM: CT HEAD WITHOUT CONTRAST CT CERVICAL SPINE WITHOUT CONTRAST TECHNIQUE: Multidetector CT imaging of the head and cervical spine was performed following the standard protocol without intravenous contrast. Multiplanar CT image reconstructions of the cervical spine were also generated. RADIATION DOSE REDUCTION: This exam was performed according to the departmental dose-optimization program which includes automated exposure control, adjustment of the mA and/or kV according to patient size and/or use of iterative reconstruction technique. COMPARISON:  None. FINDINGS: CT HEAD FINDINGS Brain: No acute hemorrhage, midline shift or mass effect. No extra-axial fluid collection. Mild cerebral atrophy is noted. Vascular: No hyperdense vessel or unexpected calcification. Skull: Normal. Negative for fracture or focal lesion. Sinuses/Orbits: No acute finding. Other: None. CT CERVICAL SPINE FINDINGS Alignment: There is mild retrolisthesis at C4-C5. Skull base and vertebrae: No acute fracture. No primary bone lesion or focal pathologic process. Soft tissues and  spinal canal: No prevertebral fluid or swelling. No visible canal hematoma. Disc levels: Intervertebral disc space narrowing and degenerative endplate changes are seen predominantly at C4-C5 and C5-C6 resulting in moderate to severe spinal canal and neural foraminal stenosis. Upper chest: Negative. Other: Atherosclerotic calcification of the carotid arteries. IMPRESSION: 1. No acute intracranial process. 2. No acute fracture in the cervical spine. 3. 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 Electronically Signed   By: Brett Fairy M.D.   On: 11/05/2021 01:21   CT Cervical Spine Wo Contrast  Result Date: 11/05/2021 CLINICAL DATA:  Fall, head and neck trauma. EXAM: CT HEAD WITHOUT CONTRAST CT CERVICAL SPINE WITHOUT CONTRAST TECHNIQUE: Multidetector CT imaging of the head and cervical spine was performed following the standard protocol without intravenous contrast. Multiplanar CT image reconstructions of the cervical spine were also generated. RADIATION DOSE REDUCTION: This exam was performed according to the departmental dose-optimization program which includes automated exposure control, adjustment of the mA and/or kV according to patient size and/or use of iterative reconstruction technique. COMPARISON:  None. FINDINGS: CT HEAD FINDINGS Brain: No acute hemorrhage, midline shift or mass effect. No extra-axial fluid collection. Mild cerebral atrophy is noted. Vascular: No hyperdense vessel or unexpected calcification. Skull: Normal. Negative for fracture or focal lesion. Sinuses/Orbits: No acute finding. Other: None. CT CERVICAL SPINE FINDINGS Alignment: There is mild retrolisthesis at C4-C5. Skull base and vertebrae: No acute fracture. No primary bone lesion or focal pathologic process. Soft tissues and spinal canal: No prevertebral fluid or swelling. No visible canal hematoma. Disc levels: Intervertebral disc space narrowing and degenerative endplate changes are seen predominantly at C4-C5  and C5-C6 resulting in moderate to severe spinal canal and neural foraminal stenosis. Upper chest: Negative. Other: Atherosclerotic calcification of the carotid arteries. IMPRESSION: 1. No acute intracranial process. 2. No acute fracture in the cervical spine. 3. 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 Electronically Signed   By: Brett Fairy M.D.   On: 11/05/2021 01:21   DG Chest Port 1 View  Result Date: 11/05/2021 CLINICAL DATA:  Weakness, vomiting blood. EXAM: PORTABLE CHEST 1 VIEW COMPARISON:  None. FINDINGS: The heart is enlarged the mediastinal contours within normal limits. Atherosclerotic calcification of the aorta is noted. The pulmonary vasculature is distended. Interstitial prominence is noted bilaterally. Subsegmental atelectasis is noted at the right lung base. No effusion or pneumothorax. No acute osseous abnormality.  IMPRESSION: 1. Cardiomegaly with pulmonary vascular congestion. 2. Interstitial prominence bilaterally, possible edema or infiltrate. Electronically Signed   By: Brett Fairy M.D.   On: 11/05/2021 01:22    EKG: Independently reviewed.  Assessment/Plan Principal Problem:   Upper GI bleed Active Problems:   ESRD on hemodialysis (HCC)   Acute blood loss anemia   Pericardial effusion without cardiac tamponade   Chronic combined systolic and diastolic CHF (congestive heart failure) (HCC)   Anemia in ESRD (end-stage renal disease) (HCC)    UGIB with ABLA - On top of chronic anemia of ESRD 2u PRBC transfusion ordered and in progress Patient felt to be still actively bleeding with large melanotic stool in ED just recently. Tele monitor CT suggestive of possible esophagitis GI consulted and coming in to evaluate Anticipate urgent / emergent upper EGD hopefully PPI bolus and drip No h/o cirrhosis, liver not cirrhotic on CT scan, dont really have a strong reason to start octreotide at this point unless GI feels differently. Fentanyl PRN abd  pain H/H q6h Pericardial effusion - Suspect this secondary to CHF New since Sept, but had small pericardial effusion without tamponade present on 2d echo in Dec, suspect todays findings on CT are just progression of that in setting of recent CHF admit HPI is much more c/w a massive GIB resulting in syncope and not really c/w tamponade. ESRD - Call nephro in AM for IP dialysis during stay Chronic CHF - Holding BP meds in setting of soft BP due to GIB No pulm edema on todays evaluation. Obviously at risk for decompensation with our attempts to treat GIB with large volumes of fluid (PRBC transfusions, etc).  DVT prophylaxis: SCDs Code Status: Full Family Communication: Wife at bedside Disposition Plan: Home after GIB resolved Consults called: Dr. Benson Norway Admission status: Admit to inpatient  Severity of Illness: The appropriate patient status for this patient is INPATIENT. Inpatient status is judged to be reasonable and necessary in order to provide the required intensity of service to ensure the patient's safety. The patient's presenting symptoms, physical exam findings, and initial radiographic and laboratory data in the context of their chronic comorbidities is felt to place them at high risk for further clinical deterioration. Furthermore, it is not anticipated that the patient will be medically stable for discharge from the hospital within 2 midnights of admission.   * I certify that at the point of admission it is my clinical judgment that the patient will require inpatient hospital care spanning beyond 2 midnights from the point of admission due to high intensity of service, high risk for further deterioration and high frequency of surveillance required.*   Torri Langston M. DO [facility]  How to contact the [facility] Attending or Consulting provider [place] or covering provider during after hours [place], for this patient?  Check the care team in [facility] and look for a)  attending/consulting [facility] provider listed and b) the [facility] team listed Log into [email or web address]  Amion Physician Scheduling and messaging for groups and whole hospitals  On call and physician scheduling software for group practices, residents, hospitalists and other medical providers for call, clinic, rotation and shift schedules. On[employer] is a hospital-wide system for scheduling doctors and paging doctors on call. EasyPlot is for scientific plotting and data analysis.  [email or web address]  and use [place]'s universal password to access. If you do not have the password, please contact the hospital operator.  Locate the [facility] provider you are looking for under [facility]  and page to a number that you can be directly reached. If you still have difficulty reaching the provider, please page the [facility] (Director on Call) for the Hospitalists listed on amion for assistance.  11/05/2021, 2:55 AM

## 2021-11-05 NOTE — ED Notes (Signed)
MD Gardner at bedside. 

## 2021-11-05 NOTE — ED Notes (Signed)
Pt free IV infiltrated - MD notified - IV team consulted

## 2021-11-05 NOTE — ED Provider Notes (Signed)
D/w Dr. Benson Norway with GI who will see patient [phone] BP 93/64    Pulse 73    Temp 98.2 F (36.8 C) (Oral)    Resp 12    SpO2 94%     Ripley Fraise, MD 11/05/21 [phone]

## 2021-11-05 NOTE — Consult Note (Signed)
NAME:  Timothy [name], MRN:  [id number], DOB:  [birth year], LOS: 0 ADMISSION DATE:  11/04/2021, CONSULTATION DATE:  11/05/21 REFERRING MD:  Sheran Luz DO , CHIEF COMPLAINT:   Acute GI bleed  History of Present Illness:  61 year old with history of end-stage renal disease, hypertension, HFrEF, colon cancer and polysubstance abuse admitted with syncope episode with hematemesis, large bloody BMs.  He received 2 units of PRBC and taken emergently for EGD by Dr. Benson Norway.  EGD findings are large duodenal actively bleeding ulcer with visible which cannot be controlled.  IR has been called for embolization.  He is intubated and PCCM called for admission to ICU  Pertinent  Medical History    has a past medical history of Anemia of chronic kidney failure, BPH (benign prostatic hyperplasia), Colon cancer ([place]) (2014), End stage renal disease on dialysis ([place]) (2017), Hypertension, Hypertensive heart disease with chronic diastolic congestive heart failure ([place]) (07/17/2016), and Polysubstance abuse ([place]).   Significant Hospital Events: Including procedures, antibiotic start and stop dates in addition to other pertinent events   1/21 admitted with acute GI bleed, active duodenal ulcer with bleeding resolved.  Status post EGD.  IR called for embolization  Interim History / Subjective:    Objective   Blood pressure 101/68, pulse 67, temperature 98.9 F (37.2 C), temperature source Temporal, resp. rate 18, SpO2 100 %.        Intake/Output Summary (Last 24 hours) at 11/05/2021 0743 Last data filed at 11/05/2021 0731 Gross per 24 hour  Intake 1211.3 ml  Output --  Net 1211.3 ml   There were no vitals filed for this visit.  Examination: Gen:      No acute distress, chronically ill-appearing HEENT:  EOMI, sclera anicteric Neck:     No masses; no thyromegaly Lungs:    Clear to auscultation bilaterally; normal respiratory effort CV:         Regular rate and rhythm; no murmurs Abd:      + bowel sounds;  soft, non-tender; no palpable masses, no distension Ext:    No edema; adequate peripheral perfusion Skin:      Warm and dry; no rash Neuro: Sedated, unresponsive  Labs/imaging reviewed Significant for sodium 133, creatinine 6.50, hemoglobin 7.8 INR 1.5 Chest x-ray with cardiomegaly, pulmonary vascular congestion.  Resolved Hospital Problem list     Assessment & Plan:  Upper GI bleed, bleeding duodenal ulcer S/p EGD.  He is still actively bleeding and will need IR for embolization Continue PPI drip Transfuse PRBC, goal hemoglobin greater than 7  End-stage renal disease History of noncompliance with dialysis with recent admission for volume overload Will need nephrology to be called after he has been stabilized from his GI bleed  Cardiomyopathy, heart failure EF 40 to 45% Holding outpatient medications  Best Practice (right click and "Reselect all SmartList Selections" daily)   Diet/type: NPO DVT prophylaxis: not indicated GI prophylaxis: PPI Lines: Arterial Line Foley:  N/A Code Status:  full code Last date of multidisciplinary goals of care discussion []   Labs   CBC: Recent Labs  Lab 10/31/21 1459 11/01/21 0304 11/04/21 2350 11/04/21 2358  WBC 7.5 7.4 11.1*  --   NEUTROABS  --  4.9 8.0*  --   HGB 10.2* 9.8* 7.8* 9.2*  HCT 31.6* 29.9* 24.0* 27.0*  MCV 81.9 81.5 83.6  --   PLT 451* 460* 310  --     Basic Metabolic Panel: Recent Labs  Lab 10/31/21 1459 11/01/21 0304 11/02/21 0439 11/04/21 2350  11/04/21 2358  NA 131* 131* 130* 134* 133*  K 4.8 5.7* 5.1 4.2 4.2  CL 87* 89* 89* 92* 93*  CO2 25 24 22 28   --   GLUCOSE 87 97 82 89 85  BUN 48* 61* 50* 57* 60*  CREATININE 6.92* 7.82* 5.51* 6.62* 6.50*  CALCIUM 8.9 8.8* 8.3* 7.6*  --   MG  --  2.9*  --   --   --   PHOS  --  8.7*  --   --   --    GFR: Estimated Creatinine Clearance: 11.4 mL/min (A) (by C-G formula based on SCr of 6.5 mg/dL (H)). Recent Labs  Lab 10/31/21 1459 11/01/21 0304  11/04/21 2350  WBC 7.5 7.4 11.1*    Liver Function Tests: Recent Labs  Lab 11/01/21 0304 11/04/21 2350  AST 24 104*  ALT 15 116*  ALKPHOS 91 75  BILITOT 1.1 0.9  PROT 7.4 6.4*  ALBUMIN 2.7* 2.3*   No results for input(s): LIPASE, AMYLASE in the last 168 hours. No results for input(s): AMMONIA in the last 168 hours.  ABG    Component Value Date/Time   PHART 7.317 (L) 09/09/2016 0931   PCO2ART 42.5 09/09/2016 0931   PO2ART 371.0 (H) 09/09/2016 0931   HCO3 21.8 09/09/2016 0931   TCO2 31 11/04/2021 2358   ACIDBASEDEF 4.0 (H) 09/09/2016 0931   O2SAT 100.0 09/09/2016 0931     Coagulation Profile: Recent Labs  Lab 11/04/21 2350  INR 1.5*    Cardiac Enzymes: No results for input(s): CKTOTAL, CKMB, CKMBINDEX, TROPONINI in the last 168 hours.  HbA1C: No results found for: HGBA1C  CBG: No results for input(s): GLUCAP in the last 168 hours.  Review of Systems:   Unable to obtain as patient is intubated  Past Medical History:  He,  has a past medical history of Anemia of chronic kidney failure, BPH (benign prostatic hyperplasia), Colon cancer ([place]) (2014), End stage renal disease on dialysis ([place]) (2017), Hypertension, Hypertensive heart disease with chronic diastolic congestive heart failure ([place]) (07/17/2016), and Polysubstance abuse ([place]).   Surgical History:   Past Surgical History:  Procedure Laterality Date   ABDOMINAL SURGERY     AV FISTULA PLACEMENT Left 09/19/2016   Procedure: Left arm Radiocephalic ARTERIOVENOUS (AV) FISTULA CREATION;  Surgeon: Conrad Alamillo, MD;  Location: [facility];  Service: Vascular;  Laterality: Left;   [place] Left 07/09/2017   Procedure: BRACHIOCEPHALIC FISTULA CREATION;  Surgeon: Conrad National, MD;  [facility];  Service: Vascular;  Laterality: Left;   COLON SURGERY  2014   INSERTION OF DIALYSIS CATHETER N/A 09/19/2016   Procedure: INSERTION OF TUNNELED DIALYSIS CATHETER;  Surgeon: Conrad Stillwater, MD;  Location: [facility];  Service: Vascular;  Laterality: N/A;   IR GENERIC HISTORICAL  09/14/2016   IR US GUIDE VASC ACCESS RIGHT 09/14/2016 [name], DO [facility]   IR GENERIC HISTORICAL  09/14/2016   IR FLUORO GUIDE CV LINE RIGHT 09/14/2016 [name], DO [facility]   IR PARACENTESIS  06/22/2021   LAPAROTOMY N/A 05/23/2021   Procedure: EXPLORATORY LAPAROTOMY LYSIS ADHESIONS;  Surgeon: [name], MD;  Location: [facility];  Service: General;  Laterality: N/A;   ORIF TIBIA PLATEAU Left 01/15/2018   Procedure: OPEN REDUCTION INTERNAL FIXATION (ORIF) TIBIAL PLATEAU;  Surgeon: Altamese Toftrees, MD;  Location: [facility];  Service: Orthopedics;  Laterality: Left;   TRANSTHORACIC ECHOCARDIOGRAM  07/2016    EF 60-65%, No RWMA. Mod Concentric LVH -  Gr 2 DD. Severe LA dilation. PAP ~35 mmHg (mild Pulm HTN)  --> no changes noted 1 month later     Social History:   reports that he has been smoking cigarettes. He has been smoking an average of .25 packs per day. He has never used smokeless tobacco. He reports current drug use. Frequency: 1.00 time per week. Drugs: Marijuana and Heroin. He reports that he does not drink alcohol.   Family History:  His family history includes CAD in his nephew; Diabetes Mellitus II in his mother; Heart attack (age of onset: 10) in his mother; Heart failure in his mother; Hypertension in his mother; Kidney failure in his sister; Other in his father and another family member.   Allergies No Known Allergies   Home Medications  Prior to Admission medications   Medication Sig Start Date End Date Taking? Authorizing Provider  carvedilol (COREG) 6.25 MG tablet Take 1 tablet (6.25 mg total) by mouth 2 (two) times daily with a meal. 10/09/21  Yes Sharen Hones, MD  hydrALAZINE (APRESOLINE) 25 MG tablet Take 1 tablet (25 mg total) by mouth every 8 (eight) hours. DO not take this before dialysis on TTS Patient taking differently: Take 25 mg by mouth daily. 11/02/21  Yes Domenic Polite, MD   LIDOCAINE-PRILOCAINE EX Apply 1 application topically See admin instructions. Prior to dialysis Tuesday,Thursday and saturday   Yes [provider]  Oxycodone HCl 10 MG TABS Take 10 mg by mouth 2 (two) times daily as needed (pain). 10/26/21  Yes [provider]  sevelamer carbonate (RENVELA) 800 MG tablet Take 1,600 mg by mouth with breakfast, with lunch, and with evening meal. 05/12/21  Yes [provider]     Critical care time:    The patient is critically ill with multiple organ system failure and requires high complexity decision making for assessment and support, frequent evaluation and titration of therapies, advanced monitoring, review of radiographic studies and interpretation of complex data.   Critical Care Time devoted to patient care services, exclusive of separately billable procedures, described in this note is 45 minutes.   Marshell Garfinkel MD [facility] See Amion for pager  If no response to pager , please call [phone] until 7pm After 7:00 pm call Elink  [phone] 11/05/2021, 7:56 AM

## 2021-11-05 NOTE — Op Note (Signed)
[facility] Patient Name: Timothy [name] Procedure Date : 11/05/2021 MRN: [id number] Attending MD: Carol Ada , MD Date of Birth: 1961/06/19 CSN: [id number] Age: 61 Admit Type: Inpatient Procedure:                Upper GI endoscopy Indications:              Hematemesis, Melena Providers:                Carol Ada, MD, Vista Lawman, RN, Frazier Richards,                            Technician Referring MD:              Medicines:                General Anesthesia Complications:            No immediate complications. Estimated Blood Loss:     2 - 4 units Procedure:                Pre-Anesthesia Assessment:                           - Prior to the procedure, a History and Physical                            was performed, and patient medications and                            allergies were reviewed. The patient's tolerance of                            previous anesthesia was also reviewed. The risks                            and benefits of the procedure and the sedation                            options and risks were discussed with the patient.                            All questions were answered, and informed consent                            was obtained. Prior Anticoagulants: The patient has                            taken no previous anticoagulant or antiplatelet                            agents. ASA Grade Assessment: IV - A patient with                            severe systemic disease that is a constant threat  to life. After reviewing the risks and benefits,                            the patient was deemed in satisfactory condition to                            undergo the procedure.                           - Sedation was administered by an anesthesia                            professional. General anesthesia was attained.                           After obtaining informed consent, the endoscope was                             passed under direct vision. Throughout the                            procedure, the patient's blood pressure, pulse, and                            oxygen saturations were monitored continuously. The                            [id number]-[id number] ([id number] Olympus endoscope was                            introduced through the mouth, and advanced to the                            duodenal bulb. The upper GI endoscopy was performed                            with difficulty. The patient tolerated the                            procedure well. Scope In: Scope Out: Findings:      LA Grade A (one or more mucosal breaks less than 5 mm, not extending       between tops of 2 mucosal folds) esophagitis with no bleeding was found       at the gastroesophageal junction.      A large amount of food (residue) was found in the gastric fundus and in       the gastric body.      One non-bleeding cratered duodenal ulcer with a nonbleeding visible       vessel (Forrest Class IIa) was found in the duodenal bulb. The lesion       was 20 mm in largest dimension. Area was successfully injected with 3 mL       of a 1:10,000 solution of epinephrine for drug delivery. To stop active       bleeding, hemostatic spray was deployed. Multiple sprays were applied.  There was no bleeding at the end of the procedure.      The therapeutic endoscope was advanced into the esophagus after the       patient was intubated. An LA Grade A esophagitis was identified. In the       gastric lumen there was a significant amount of retained gastric       contents. The endoscope was able to be maneuvered beyond the area and       into the antrum. There was evidence of some old blood in the region.       Upon entry into the duodenal bulb a large 2 cm cratered duodenal ulcer       with a large visible vessel was identified. The vessel was not bleeding.       Epinephrine 1:10,000 was injected close to the visible vessel in the rim        of the ulcer. The expected blanching was noted after the injection. A       decision was made to prepare for BICAP of the vessel with a 10 Fr probe       and having hemospray as back up. At this time the vessel spontaneously       erupted and massive bleeding ensued. The patient was now exsanguinating       and his bed was quickly moved into a trendelenburg position. Hemospray       was deployed, but technical issues with premature clotting of the       delivery tube occurred. Multiple hemosprays applications in partial       spurts were performed with several hemospray kits. With these       intermittent applications the bleeding did significantly slow down.       Concurrently with nursing and anesthesia assistance IR was contacted and       the patient's status was relayed to IR. An arterial line was placed by       anesthesia documenting a systolic in the low 409 mmHg range with       resuscitative and endoscopic interventions. A final application of       hemospray was delivered in a large and diffuse quantity using the 10 Fr       probe from a double channel scope. An ICU bed was obtained on 84M. Impression:               - LA Grade A reflux esophagitis with no bleeding.                           - A large amount of food (residue) in the stomach.                           - Non-bleeding duodenal ulcer with a nonbleeding                            visible vessel (Forrest Class IIa). Injected.                            hemostatic spray applied.                           - No specimens collected. Recommendation:           -  Transport to [facility] when ready.                           - Continue with PPI.                           - Follow HGB and transfuse as necessary.                           - Check H. pylori serology and treat if positive. Procedure Code(s):        --- Professional ---                           [phone], Esophagogastroduodenoscopy, flexible,                             transoral; with control of bleeding, any method                           43236, 59, Esophagogastroduodenoscopy, flexible,                            transoral; with directed submucosal injection(s),                            any substance Diagnosis Code(s):        --- Professional ---                           K26.4, Chronic or unspecified duodenal ulcer with                            hemorrhage                           K21.00, Gastro-esophageal reflux disease with                            esophagitis, without bleeding                           K92.0, Hematemesis                           K92.1, Melena (includes Hematochezia) CPT copyright 2019 American Medical Association. All rights reserved. The codes documented in this report are preliminary and upon coder review may  be revised to meet current compliance requirements. Carol Ada, MD Carol Ada, MD 11/05/2021 8:40:10 AM This report has been signed electronically. Number of Addenda: 0

## 2021-11-05 NOTE — Anesthesia Preprocedure Evaluation (Signed)
Anesthesia Evaluation  Patient identified by MRN, date of birth, ID band Patient awake    Reviewed: Allergy & Precautions, NPO status , Patient's Chart, lab work & pertinent test results  Airway Mallampati: I  TM Distance: >3 FB Neck ROM: Full    Dental  (+) Poor Dentition, Missing   Pulmonary neg pulmonary ROS, Current Smoker,    Pulmonary exam normal        Cardiovascular hypertension, Pt. on medications and Pt. on home beta blockers +CHF and + DOE   Rhythm:Regular Rate:Normal  New large pericardial effusion per CT imaging for GIB assessment   Neuro/Psych Depression negative neurological ROS     GI/Hepatic (+)     substance abuse  marijuana use, GIB   Endo/Other  negative endocrine ROS  Renal/GU ESRF and DialysisRenal disease  negative genitourinary   Musculoskeletal  (+) narcotic dependent  Abdominal Normal abdominal exam  (+)   Peds  Hematology  (+) anemia ,   Anesthesia Other Findings   Reproductive/Obstetrics                             Anesthesia Physical Anesthesia Plan  ASA: 4 and emergent  Anesthesia Plan: General   Post-op Pain Management:    Induction: Rapid sequence and Intravenous  PONV Risk Score and Plan: 1 and Ondansetron, Dexamethasone and Treatment may vary due to age or medical condition  Airway Management Planned: Mask and Oral ETT  Additional Equipment: None  Intra-op Plan:   Post-operative Plan: Extubation in OR  Informed Consent: I have reviewed the patients History and Physical, chart, labs and discussed the procedure including the risks, benefits and alternatives for the proposed anesthesia with the patient or authorized representative who has indicated his/her understanding and acceptance.     Dental advisory given  Plan Discussed with: CRNA  Anesthesia Plan Comments: (Lab Results      Component                Value               Date                       WBC                      11.1 (H)            11/04/2021                HGB                      9.2 (L)             11/04/2021                HCT                      27.0 (L)            11/04/2021                MCV                      83.6                11/04/2021                PLT  310                 11/04/2021           Lab Results      Component                Value               Date                      NA                       133 (L)             11/04/2021                K                        4.2                 11/04/2021                CO2                      28                  11/04/2021                GLUCOSE                  85                  11/04/2021                BUN                      60 (H)              11/04/2021                CREATININE               6.50 (H)            11/04/2021                CALCIUM                  7.6 (L)             11/04/2021                GFRNONAA                 9 (L)               11/04/2021           ECHO 12/22: 1. Left ventricular ejection fraction, by estimation, is 40 to 45%. The  left ventricle has mildly decreased function. The left ventricle  demonstrates global hypokinesis. There is moderate concentric left  ventricular hypertrophy. Left ventricular  diastolic function could not be evaluated.  2. Right ventricular systolic function is low normal. The right  ventricular size is moderately enlarged. Moderately increased right  ventricular wall thickness. There is moderately elevated pulmonary artery  systolic pressure.  3. A small pericardial effusion is present. The pericardial effusion is  circumferential. There is no evidence of cardiac tamponade.  4. Moderate mitral valve regurgitation.  5. Tricuspid valve regurgitation is moderate to severe.  6. The aortic valve is tricuspid. There is mild calcification of the  aortic valve. There is moderate thickening of the aortic  valve. Aortic  valve regurgitation is mild.  7. The inferior vena cava is dilated in size with <50% respiratory  variability, suggesting right atrial pressure of 15 mmHg. )        Anesthesia Quick Evaluation

## 2021-11-05 NOTE — ED Notes (Signed)
GI at bedside

## 2021-11-05 NOTE — Procedures (Addendum)
Interventional Radiology Procedure Note  EMERGENCY CONSENT FOR PROCEDURE.  Unable to contact Benjamine Mola (spouse) and patient is in critical condition, ventilated, sedated, and receiving critical care management with anesthesia  Procedure: celiac and GDA angio with GDA microcoil embo    Complications: None  Estimated Blood Loss:  min  Findings: No active arterial bleeding at expected site of duodenal bulb ulcer Successful GDA Jacqlyn Krauss, MD

## 2021-11-05 NOTE — ED Notes (Addendum)
Pt requesting something else for pain - MD Alcario Drought notified - order placed for pain meds

## 2021-11-05 NOTE — Anesthesia Procedure Notes (Signed)
Procedure Name: Intubation Date/Time: 11/05/2021 6:20 AM Performed by: Clovis Cao, CRNA Pre-anesthesia Checklist: Patient identified, Emergency Drugs available, Suction available and Patient being monitored Patient Re-evaluated:Patient Re-evaluated prior to induction Oxygen Delivery Method: Circle system utilized Preoxygenation: Pre-oxygenation with 100% oxygen Induction Type: IV induction, Rapid sequence and Cricoid Pressure applied Laryngoscope Size: [name] and 2 Grade View: Grade I Tube type: Oral Tube size: 7.5 mm Number of attempts: 1 Airway Equipment and Method: Stylet and Oral airway Placement Confirmation: ETT inserted through vocal cords under direct vision, positive ETCO2 and breath sounds checked- equal and bilateral Secured at: 22 cm Tube secured with: Tape Dental Injury: Teeth and Oropharynx as per pre-operative assessment

## 2021-11-05 NOTE — ED Notes (Addendum)
Pt taken to Endo  

## 2021-11-05 NOTE — Anesthesia Postprocedure Evaluation (Signed)
Anesthesia Post Note  Patient: Timothy [name]  Procedure(s) Performed: ESOPHAGOGASTRODUODENOSCOPY (EGD) WITH PROPOFOL HEMOSTASIS CONTROL SCLEROTHERAPY     Patient location during evaluation: ICU Anesthesia Type: General Level of consciousness: sedated Pain management: pain level controlled Vital Signs Assessment: post-procedure vital signs reviewed and stable Respiratory status: patient remains intubated per anesthesia plan Cardiovascular status: stable Postop Assessment: no apparent nausea or vomiting Anesthetic complications: no   No notable events documented.  Last Vitals:  Vitals:   11/05/21 1530 11/05/21 1541  BP:    Pulse:    Resp:    Temp:  36.6 C  SpO2: 100%     Last Pain:  Vitals:   11/05/21 1541  TempSrc: Oral  PainSc:                  Amandamarie Feggins P Dawaun Brancato

## 2021-11-05 NOTE — ED Notes (Signed)
PT with loose stools multiple times - blood tinged - MD notified - pt cleaned up and brief placed on pt

## 2021-11-05 NOTE — Consult Note (Signed)
Reason for Consult: Hematemesis and Melena Referring Physician: [facility]  [name] HPI: This is a 61 year old male with a PMH of ESRD (last dialysis on Tuesday), HTN, HFrEF, history of colon cancer, and polysubstance abuse admitted for complaints of hematemesis and syncope.  Per his report and the ER notes, the patient went to the restroom last last evening to have a bowel movement and he woke up in a pool of blood.  This was noted by his wife who summoned EMS.  In the ER around 2 AM he had a large melenic bowel movement.  The patient was resuscitated and his vital signs were stabilized.  A CT scan of the abdomen and pelvis showed an esophagitis, gastric contents, and a larger pericardial effusion.  The patient denies any prior history of a GI bleed in the past.  The patient was recently discharged from the hospital for fluid overload.  He has a history of hypotension with dialysis and he was not able to complete his sessions, however, renal reports that he was able to obtain additional sessions to remove additional fluid.  On 11/01/2021 his BNP was 1194 and he has an EF of 40-45%.  Currently the patient reports that he is "dehydrated" and he desires ice chips.  Past Medical History:  Diagnosis Date   Anemia of chronic kidney failure    BPH (benign prostatic hyperplasia)    Colon cancer ([place]) 2014   End stage renal disease on dialysis ([place]) 2017   Hypertension    Hypertensive heart disease with chronic diastolic congestive heart failure ([place]) 07/17/2016   Polysubstance abuse ([place])    History of heroin and marijuana use    Past Surgical History:  Procedure Laterality Date   ABDOMINAL SURGERY     AV FISTULA PLACEMENT Left 09/19/2016   Procedure: Left arm Radiocephalic ARTERIOVENOUS (AV) FISTULA CREATION;  Surgeon: Conrad Crook, MD;  Location: [place];  Service: Vascular;  Laterality: Left;   BASCILIC VEIN TRANSPOSITION Left 07/09/2017   Procedure: BRACHIOCEPHALIC FISTULA CREATION;   Surgeon: Conrad Vicco, MD;  Location: [place];  Service: Vascular;  Laterality: Left;   COLON SURGERY  2014   INSERTION OF DIALYSIS CATHETER N/A 09/19/2016   Procedure: INSERTION OF TUNNELED DIALYSIS CATHETER;  Surgeon: Conrad Sherrill, MD;  Location: [facility];  Service: Vascular;  Laterality: N/A;   IR GENERIC HISTORICAL  09/14/2016   IR US GUIDE VASC ACCESS RIGHT 09/14/2016 [name], DO [facility]   IR GENERIC HISTORICAL  09/14/2016   IR FLUORO GUIDE CV LINE RIGHT 09/14/2016 [name], DO [facility]   IR PARACENTESIS  06/22/2021   LAPAROTOMY N/A 05/23/2021   Procedure: EXPLORATORY LAPAROTOMY LYSIS ADHESIONS;  Surgeon: [name], MD;  Location: [place];  Service: General;  Laterality: N/A;   ORIF TIBIA PLATEAU Left 01/15/2018   Procedure: OPEN REDUCTION INTERNAL FIXATION (ORIF) TIBIAL PLATEAU;  Surgeon: Altamese Tijeras, MD;  Location: [facility];  Service: Orthopedics;  Laterality: Left;   TRANSTHORACIC ECHOCARDIOGRAM  07/2016    EF 60-65%, No RWMA. Mod Concentric LVH - Gr 2 DD. Severe LA dilation. PAP ~35 mmHg (mild Pulm HTN)  --> no changes noted 1 month later    Family History  Problem Relation Age of Onset   Heart failure Mother        Died at age 93.   Heart attack Mother 51   Hypertension Mother    Diabetes Mellitus II Mother    Other Father  Unknown   Kidney failure Sister        (Oldest Sister)   Other Other        Multiple siblings have started her heart disease, he is not sure of the details.   CAD Nephew     Social History:  reports that he has been smoking cigarettes. He has been smoking an average of .25 packs per day. He has never used smokeless tobacco. He reports current drug use. Frequency: 1.00 time per week. Drugs: Marijuana and Heroin. He reports that he does not drink alcohol.  Allergies: No Known Allergies  Medications: Scheduled:  [START ON 11/08/2021] pantoprazole  40 mg Intravenous Q12H   Continuous:  sodium chloride     erythromycin      pantoprazole 8 mg/hr (11/05/21 0453)    Results for orders placed or performed during the hospital encounter of 11/04/21 (from the past 24 hour(s))  Type and screen [place]     Status: None (Preliminary result)   Collection Time: 11/04/21 11:49 PM  Result Value Ref Range   ABO/RH(D) B POS    Antibody Screen NEG    Sample Expiration 11/07/2021,2359    Unit Number [id number]    Blood Component Type RED CELLS,LR    Unit division 00    Status of Unit ISSUED    Transfusion Status OK TO TRANSFUSE    Crossmatch Result      Compatible Performed at [facility], [address], [place], [place] [address]    Unit Number [id number]    Blood Component Type RED CELLS,LR    Unit division 00    Status of Unit ALLOCATED    Transfusion Status OK TO TRANSFUSE    Crossmatch Result Compatible   CBC with Differential/Platelet     Status: Abnormal   Collection Time: 11/04/21 11:50 PM  Result Value Ref Range   WBC 11.1 (H) 4.0 - 10.5 K/uL   RBC 2.87 (L) 4.22 - 5.81 MIL/uL   Hemoglobin 7.8 (L) 13.0 - 17.0 g/dL   HCT 24.0 (L) 39.0 - 52.0 %   MCV 83.6 80.0 - 100.0 fL   MCH 27.2 26.0 - 34.0 pg   MCHC 32.5 30.0 - 36.0 g/dL   RDW 20.8 (H) 11.5 - 15.5 %   Platelets 310 150 - 400 K/uL   nRBC 0.2 0.0 - 0.2 %   Neutrophils Relative % 73 %   Neutro Abs 8.0 (H) 1.7 - 7.7 K/uL   Lymphocytes Relative 9 %   Lymphs Abs 1.0 0.7 - 4.0 K/uL   Monocytes Relative 17 %   Monocytes Absolute 1.9 (H) 0.1 - 1.0 K/uL   Eosinophils Relative 0 %   Eosinophils Absolute 0.0 0.0 - 0.5 K/uL   Basophils Relative 0 %   Basophils Absolute 0.0 0.0 - 0.1 K/uL   Immature Granulocytes 1 %   Abs Immature Granulocytes 0.15 (H) 0.00 - 0.07 K/uL  Comprehensive metabolic panel     Status: Abnormal   Collection Time: 11/04/21 11:50 PM  Result Value Ref Range   Sodium 134 (L) 135 - 145 mmol/L   Potassium 4.2 3.5 - 5.1 mmol/L   Chloride 92 (L) 98 - 111 mmol/L   CO2 28 22 - 32 mmol/L   Glucose, Bld  89 70 - 99 mg/dL   BUN 57 (H) 6 - 20 mg/dL   Creatinine, Ser 6.62 (H) 0.61 - 1.24 mg/dL   Calcium 7.6 (L) 8.9 - 10.3 mg/dL  Total Protein 6.4 (L) 6.5 - 8.1 g/dL   Albumin 2.3 (L) 3.5 - 5.0 g/dL   AST 104 (H) 15 - 41 U/L   ALT 116 (H) 0 - 44 U/L   Alkaline Phosphatase 75 38 - 126 U/L   Total Bilirubin 0.9 0.3 - 1.2 mg/dL   GFR, Estimated 9 (L) >60 mL/min   Anion gap 14 5 - 15  Protime-INR     Status: Abnormal   Collection Time: 11/04/21 11:50 PM  Result Value Ref Range   Prothrombin Time 18.0 (H) 11.4 - 15.2 seconds   INR 1.5 (H) 0.8 - 1.2  Resp Panel by RT-PCR (Flu A&B, Covid) Nasopharyngeal Swab     Status: None   Collection Time: 11/04/21 11:51 PM   Specimen: Nasopharyngeal Swab; Nasopharyngeal(NP) swabs in vial transport medium  Result Value Ref Range   SARS Coronavirus 2 by RT PCR NEGATIVE NEGATIVE   Influenza A by PCR NEGATIVE NEGATIVE   Influenza B by PCR NEGATIVE NEGATIVE  Ethanol     Status: None   Collection Time: 11/04/21 11:51 PM  Result Value Ref Range   Alcohol, Ethyl (B) <10 <10 mg/dL  POC occult blood, ED     Status: None   Collection Time: 11/04/21 11:52 PM  Result Value Ref Range   Fecal Occult Bld NEGATIVE NEGATIVE  I-stat chem 8, ED (not at [facility] or [facility])     Status: Abnormal   Collection Time: 11/04/21 11:58 PM  Result Value Ref Range   Sodium 133 (L) 135 - 145 mmol/L   Potassium 4.2 3.5 - 5.1 mmol/L   Chloride 93 (L) 98 - 111 mmol/L   BUN 60 (H) 6 - 20 mg/dL   Creatinine, Ser 6.50 (H) 0.61 - 1.24 mg/dL   Glucose, Bld 85 70 - 99 mg/dL   Calcium, Ion 0.91 (L) 1.15 - 1.40 mmol/L   TCO2 31 22 - 32 mmol/L   Hemoglobin 9.2 (L) 13.0 - 17.0 g/dL   HCT 27.0 (L) 39.0 - 52.0 %  Prepare RBC (crossmatch)     Status: None   Collection Time: 11/05/21 12:04 AM  Result Value Ref Range   Order Confirmation      ORDER PROCESSED BY BLOOD BANK Performed at [facility], [address], [place], [place] [address]      CT ABDOMEN PELVIS WO CONTRAST  Result  Date: 11/05/2021 CLINICAL DATA:  Status post trauma. EXAM: CT ABDOMEN AND PELVIS WITHOUT CONTRAST TECHNIQUE: Multidetector CT imaging of the abdomen and pelvis was performed following the standard protocol without IV contrast. RADIATION DOSE REDUCTION: This exam was performed according to the departmental dose-optimization program which includes automated exposure control, adjustment of the mA and/or kV according to patient size and/or use of iterative reconstruction technique. COMPARISON:  June 25, 2021 FINDINGS: Lower chest: There is mild cardiomegaly with a large (approximately 1.5 cm) pericardial effusion. This represents a new finding. Diffusely increased attenuation of the myocardium is noted. Mild to moderate severity areas of atelectasis are seen within the bilateral lower lobes. Hepatobiliary: Diffusely increased attenuation of the liver parenchyma is noted no focal liver abnormality is seen. Numerous gallstones are seen within a partially contracted gallbladder, without evidence of gallbladder wall thickening or biliary dilatation. Pancreas: There is mild pancreatic ductal dilatation (approximately 2 mm), without evidence of surrounding inflammation. Spleen: Normal in size without focal abnormality. Adrenals/Urinary Tract: Adrenal glands are unremarkable. Innumerable bilateral renal cysts are seen without obstructing renal calculi or hydronephrosis. The urinary  bladder is empty and subsequently limited in evaluation. Stomach/Bowel: Mild to moderate severity diffuse thickening of the distal esophageal wall is seen. A large amount of ingested material is seen throughout the body of the stomach. Surgically anastomosed bowel is seen within the right anterior aspect of the right upper quadrant and medial aspect of the lower right abdomen, consistent with prior right hemicolectomy. No evidence of bowel dilatation. Vascular/Lymphatic: Aortic atherosclerosis. No enlarged abdominal or pelvic lymph nodes.  Reproductive: Prostate is unremarkable. Other: No abdominal wall hernia or abnormality. No abdominopelvic ascites. Musculoskeletal: No acute or significant osseous findings. IMPRESSION: 1. Mild cardiomegaly with a large (approximately 1.5 cm) pericardial effusion. This represents a new finding. 2. Diffusely increased attenuation of the myocardium and liver, consistent with hemochromatosis. 3. Cholelithiasis. 4. Innumerable bilateral renal cysts. 5. Mild to moderate severity diffuse thickening of the distal esophageal wall, which may represent sequelae associated with esophagitis. 6. Findings consistent with prior right hemicolectomy. 7. Aortic atherosclerosis. Aortic Atherosclerosis (ICD10-I70.0). Electronically Signed   By: Virgina Norfolk M.D.   On: 11/05/2021 01:23   CT HEAD WO CONTRAST (5MM)  Result Date: 11/05/2021 CLINICAL DATA:  Fall, head and neck trauma. EXAM: CT HEAD WITHOUT CONTRAST CT CERVICAL SPINE WITHOUT CONTRAST TECHNIQUE: Multidetector CT imaging of the head and cervical spine was performed following the standard protocol without intravenous contrast. Multiplanar CT image reconstructions of the cervical spine were also generated. RADIATION DOSE REDUCTION: This exam was performed according to the departmental dose-optimization program which includes automated exposure control, adjustment of the mA and/or kV according to patient size and/or use of iterative reconstruction technique. COMPARISON:  None. FINDINGS: CT HEAD FINDINGS Brain: No acute hemorrhage, midline shift or mass effect. No extra-axial fluid collection. Mild cerebral atrophy is noted. Vascular: No hyperdense vessel or unexpected calcification. Skull: Normal. Negative for fracture or focal lesion. Sinuses/Orbits: No acute finding. Other: None. CT CERVICAL SPINE FINDINGS Alignment: There is mild retrolisthesis at C4-C5. Skull base and vertebrae: No acute fracture. No primary bone lesion or focal pathologic process. Soft tissues and  spinal canal: No prevertebral fluid or swelling. No visible canal hematoma. Disc levels: Intervertebral disc space narrowing and degenerative endplate changes are seen predominantly at C4-C5 and C5-C6 resulting in moderate to severe spinal canal and neural foraminal stenosis. Upper chest: Negative. Other: Atherosclerotic calcification of the carotid arteries. IMPRESSION: 1. No acute intracranial process. 2. No acute fracture in the cervical spine. 3. 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 Electronically Signed   By: Brett Fairy M.D.   On: 11/05/2021 01:21   CT Cervical Spine Wo Contrast  Result Date: 11/05/2021 CLINICAL DATA:  Fall, head and neck trauma. EXAM: CT HEAD WITHOUT CONTRAST CT CERVICAL SPINE WITHOUT CONTRAST TECHNIQUE: Multidetector CT imaging of the head and cervical spine was performed following the standard protocol without intravenous contrast. Multiplanar CT image reconstructions of the cervical spine were also generated. RADIATION DOSE REDUCTION: This exam was performed according to the departmental dose-optimization program which includes automated exposure control, adjustment of the mA and/or kV according to patient size and/or use of iterative reconstruction technique. COMPARISON:  None. FINDINGS: CT HEAD FINDINGS Brain: No acute hemorrhage, midline shift or mass effect. No extra-axial fluid collection. Mild cerebral atrophy is noted. Vascular: No hyperdense vessel or unexpected calcification. Skull: Normal. Negative for fracture or focal lesion. Sinuses/Orbits: No acute finding. Other: None. CT CERVICAL SPINE FINDINGS Alignment: There is mild retrolisthesis at C4-C5. Skull base and vertebrae: No acute fracture. No primary bone lesion  or focal pathologic process. Soft tissues and spinal canal: No prevertebral fluid or swelling. No visible canal hematoma. Disc levels: Intervertebral disc space narrowing and degenerative endplate changes are seen predominantly at C4-C5  and C5-C6 resulting in moderate to severe spinal canal and neural foraminal stenosis. Upper chest: Negative. Other: Atherosclerotic calcification of the carotid arteries. IMPRESSION: 1. No acute intracranial process. 2. No acute fracture in the cervical spine. 3. 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 Electronically Signed   By: Brett Fairy M.D.   On: 11/05/2021 01:21   DG Chest Port 1 View  Result Date: 11/05/2021 CLINICAL DATA:  Weakness, vomiting blood. EXAM: PORTABLE CHEST 1 VIEW COMPARISON:  None. FINDINGS: The heart is enlarged the mediastinal contours within normal limits. Atherosclerotic calcification of the aorta is noted. The pulmonary vasculature is distended. Interstitial prominence is noted bilaterally. Subsegmental atelectasis is noted at the right lung base. No effusion or pneumothorax. No acute osseous abnormality. IMPRESSION: 1. Cardiomegaly with pulmonary vascular congestion. 2. Interstitial prominence bilaterally, possible edema or infiltrate. Electronically Signed   By: Brett Fairy M.D.   On: 11/05/2021 01:22    ROS:  As stated above in the HPI otherwise negative.  Blood pressure 92/63, pulse 77, temperature 98.2 F (36.8 C), temperature source Oral, resp. rate 17, SpO2 91 %.    PE: Gen: NAD, Alert and Oriented, weak appearing HEENT:  [place]/AT, EOMI Neck: Supple, no LAD Lungs: CTA CV: RRR ABD: Soft, NTND, +BS Ext: No C/C/E, fistula in the left upper arm  Assessment/Plan: 1) Hematemesis and melena. 2) Anemia. 3) Worsened pericardial effusion. 4) CHF.   The patient is hemodynamically stable, but he is hypotensive.  His HGB did increase by 2 units with the PRBC and nursing states that his last bloody bowel movement was around 2 AM.  The patient does not report any further hematemesis.  His oxygen saturation is in the low 90's.  Further evaluation with an EGD is required, but extreme caution is necessary.  The pericardial effusion is now reported as  large and he also has gastric contents.  It is not clear if the gastric contents represent blood or food material.  Plan: 1) EGD with anesthesia assistance. 2) Pantoprazole. 3) Agree with the blood transfusion for resuscitation. 4) Erythromycin 250 mg x 1 to help clear the gastric lumen.  Zaryah Seckel D 11/05/2021, 4:48 AM

## 2021-11-05 NOTE — Transfer of Care (Signed)
Immediate Anesthesia Transfer of Care Note  Patient: Timothy [name]  Procedure(s) Performed: ESOPHAGOGASTRODUODENOSCOPY (EGD) WITH PROPOFOL HEMOSTASIS CONTROL SCLEROTHERAPY  Patient Location: PACU and ICU  Anesthesia Type:General  Level of Consciousness: sedated and Patient remains intubated per anesthesia plan  Airway & Oxygen Therapy: Patient remains intubated per anesthesia plan and Patient placed on Ventilator (see vital sign flow sheet for setting)  Post-op Assessment: Report given to RN and Post -op Vital signs reviewed and stable  Post vital signs: Reviewed and stable  Last Vitals:  Vitals Value Taken Time  BP 118/75 11/05/21 0918  Temp 37.1 C 11/05/21 0928  Pulse 70 11/05/21 0929  Resp 17 11/05/21 0929  SpO2 90 % 11/05/21 0929  Vitals shown include unvalidated device data.  Last Pain:  Vitals:   11/05/21 0928  TempSrc: Oral  PainSc:          Complications: No notable events documented.

## 2021-11-06 LAB — CBC
HCT: 22.9 % — ABNORMAL LOW (ref 39.0–52.0)
Hemoglobin: 8 g/dL — ABNORMAL LOW (ref 13.0–17.0)
MCH: 28.9 pg (ref 26.0–34.0)
MCHC: 34.9 g/dL (ref 30.0–36.0)
MCV: 82.7 fL (ref 80.0–100.0)
Platelets: 206 10*3/uL (ref 150–400)
RBC: 2.77 MIL/uL — ABNORMAL LOW (ref 4.22–5.81)
RDW: 18.8 % — ABNORMAL HIGH (ref 11.5–15.5)
WBC: 13.4 10*3/uL — ABNORMAL HIGH (ref 4.0–10.5)
nRBC: 0 % (ref 0.0–0.2)

## 2021-11-06 LAB — BASIC METABOLIC PANEL
Anion gap: 16 — ABNORMAL HIGH (ref 5–15)
BUN: 111 mg/dL — ABNORMAL HIGH (ref 6–20)
CO2: 22 mmol/L (ref 22–32)
Calcium: 7.6 mg/dL — ABNORMAL LOW (ref 8.9–10.3)
Chloride: 94 mmol/L — ABNORMAL LOW (ref 98–111)
Creatinine, Ser: 6.92 mg/dL — ABNORMAL HIGH (ref 0.61–1.24)
GFR, Estimated: 8 mL/min — ABNORMAL LOW (ref >60)
Glucose, Bld: 88 mg/dL (ref 70–99)
Potassium: 4.8 mmol/L (ref 3.5–5.1)
Sodium: 132 mmol/L — ABNORMAL LOW (ref 135–145)

## 2021-11-06 LAB — HEMOGLOBIN AND HEMATOCRIT, BLOOD
HCT: 21.9 % — ABNORMAL LOW (ref 39.0–52.0)
HCT: 23.2 % — ABNORMAL LOW (ref 39.0–52.0)
Hemoglobin: 7.4 g/dL — ABNORMAL LOW (ref 13.0–17.0)
Hemoglobin: 7.7 g/dL — ABNORMAL LOW (ref 13.0–17.0)

## 2021-11-06 LAB — MAGNESIUM: Magnesium: 2.6 mg/dL — ABNORMAL HIGH (ref 1.7–2.4)

## 2021-11-06 LAB — GLUCOSE, CAPILLARY
Glucose-Capillary: 81 mg/dL (ref 70–99)
Glucose-Capillary: 81 mg/dL (ref 70–99)
Glucose-Capillary: 88 mg/dL (ref 70–99)
Glucose-Capillary: 125 mg/dL — ABNORMAL HIGH (ref 70–99)
Glucose-Capillary: 130 mg/dL — ABNORMAL HIGH (ref 70–99)

## 2021-11-06 LAB — TRIGLYCERIDES: Triglycerides: 196 mg/dL — ABNORMAL HIGH (ref <150)

## 2021-11-06 MED ORDER — POLYETHYLENE GLYCOL 3350 17 G PO PACK
17.0000 g | PACK | Freq: Every day | ORAL | Status: DC
  Administered 2021-11-09: 08:00:00 17 g via ORAL
  Filled 2021-11-06 (×2): qty 1

## 2021-11-06 MED ORDER — OXYCODONE HCL 5 MG PO TABS
10.0000 mg | ORAL_TABLET | ORAL | Status: AC
  Administered 2021-11-06: 10 mg via ORAL
  Filled 2021-11-06: qty 2

## 2021-11-06 MED ORDER — DIPHENHYDRAMINE HCL 25 MG PO CAPS
25.0000 mg | ORAL_CAPSULE | Freq: Four times a day (QID) | ORAL | Status: DC | PRN
  Administered 2021-11-06 – 2021-11-07 (×3): 25 mg via ORAL
  Filled 2021-11-06 (×3): qty 1

## 2021-11-06 MED ORDER — DOCUSATE SODIUM 100 MG PO CAPS
100.0000 mg | ORAL_CAPSULE | Freq: Two times a day (BID) | ORAL | Status: DC
  Administered 2021-11-06 – 2021-11-09 (×3): 100 mg via ORAL
  Filled 2021-11-06 (×3): qty 1

## 2021-11-06 MED ORDER — CHLORHEXIDINE GLUCONATE CLOTH 2 % EX PADS: 6.0000 | MEDICATED_PAD | Freq: Every day | CUTANEOUS | Status: DC

## 2021-11-06 MED ORDER — ORAL CARE MOUTH RINSE
15.0000 mL | Freq: Two times a day (BID) | OROMUCOSAL | Status: DC
  Administered 2021-11-06 – 2021-11-09 (×4): 15 mL via OROMUCOSAL

## 2021-11-06 MED ORDER — OXYCODONE HCL 5 MG PO TABS
10.0000 mg | ORAL_TABLET | ORAL | Status: AC | PRN
  Administered 2021-11-06 – 2021-11-07 (×3): 10 mg via ORAL
  Filled 2021-11-06 (×3): qty 2

## 2021-11-06 MED ORDER — FENTANYL CITRATE (PF) 100 MCG/2ML IJ SOLN
25.0000 ug | INTRAMUSCULAR | Status: DC | PRN
  Administered 2021-11-06 (×2): 25 ug via INTRAVENOUS
  Filled 2021-11-06 (×2): qty 2

## 2021-11-06 MED ORDER — LIDOCAINE 5 % EX PTCH
1.0000 | MEDICATED_PATCH | CUTANEOUS | Status: DC
  Administered 2021-11-06 – 2021-11-08 (×2): 1 via TRANSDERMAL
  Filled 2021-11-06 (×3): qty 1

## 2021-11-06 MED ORDER — PANTOPRAZOLE SODIUM 40 MG PO TBEC
40.0000 mg | DELAYED_RELEASE_TABLET | Freq: Two times a day (BID) | ORAL | Status: DC
  Administered 2021-11-06 – 2021-11-09 (×4): 40 mg via ORAL
  Filled 2021-11-06 (×4): qty 1

## 2021-11-06 NOTE — Consult Note (Addendum)
Renal Service Consult Note [facility]  Timothy [name] 11/06/2021 Timothy Blazing, MD Requesting Physician: Dr. Verlee [name]  Reason for Consult: ESRD pt w/ GIB HPI: The patient is a 60 y.o. year-old w/ hx of anemia, BPH, hx colon cancer, ESRD on HD, HTN, chron diast CHF and very recent admit 1/16-1/18 for vol ^/ ahrf rx'd w/ HD. Pt presented to ED on 11/04/21 for vomiting blood.  Wife found the pt on the bathroom floor and called EMS.  EMS reported BP in the 70s. In ED pt had very large melenic BM. Hb was 7.8 and 2u prbc's were ordered. GI saw pt urgently. No hx GIB. EF 40-45% from Jan 2023 echo.  Pt went for EGD w/ anesthesia assistance early am 1/21. EGD showed DU w/ visible vessel which erupted during treatment w/ near exsanguination. Bleeding was slowed w/ hemospray and procedure ended. IR was consulted and took patient for embolization which was done around 8 am 1/21. No further bloody BM"s overnight. Hb stable over the next 24 hrs. Asked to see for ESRD.      Pt seen in ICU.  Alert, doesn't remember most of yesterday. No abd pain or CP , no SOB or cough.  Lots of questions about fluid overload issues at his OP HD which we tried to address.  No hx GIB.      ROS - denies CP, no joint pain, no HA, no blurry vision, no rash, no diarrhea, no nausea/ vomiting, no dysuria, no difficulty voiding   Past Medical History  Past Medical History:  Diagnosis Date   Anemia of chronic kidney failure    BPH (benign prostatic hyperplasia)    Colon cancer ([place]) 2014   End stage renal disease on dialysis ([place]) 2017   Hypertension    Hypertensive heart disease with chronic diastolic congestive heart failure ([place]) 07/17/2016   Polysubstance abuse ([place])    History of heroin and marijuana use   Past Surgical History  Past Surgical History:  Procedure Laterality Date   ABDOMINAL SURGERY     AV FISTULA PLACEMENT Left 09/19/2016   Procedure: Left arm Radiocephalic ARTERIOVENOUS (AV) FISTULA  CREATION;  Surgeon: Conrad Athens, MD;  Location: [place];  Service: Vascular;  Laterality: Left;   [place] Left 07/09/2017   Procedure: BRACHIOCEPHALIC FISTULA CREATION;  Surgeon: Conrad [place], MD;  Location: [place];  Service: Vascular;  Laterality: Left;   COLON SURGERY  2014   INSERTION OF DIALYSIS CATHETER N/A 09/19/2016   Procedure: INSERTION OF TUNNELED DIALYSIS CATHETER;  Surgeon: Conrad Hamer, MD;  Location: [place];  Service: Vascular;  Laterality: N/A;   IR ANGIOGRAM VISCERAL SELECTIVE  11/05/2021   IR EMBO ART  VEN HEMORR LYMPH EXTRAV  INC GUIDE ROADMAPPING  11/05/2021   IR GENERIC HISTORICAL  09/14/2016   IR US GUIDE VASC ACCESS RIGHT 09/14/2016 Timothy Mckusick, DO [facility]   IR GENERIC HISTORICAL  09/14/2016   IR FLUORO GUIDE CV LINE RIGHT 09/14/2016 Timothy Mckusick, DO [facility]   IR PARACENTESIS  06/22/2021   IR US GUIDE [place] RIGHT  11/05/2021   LAPAROTOMY N/A 05/23/2021   Procedure: EXPLORATORY LAPAROTOMY LYSIS ADHESIONS;  Surgeon: [name], MD;  Location: [place];  Service: General;  Laterality: N/A;   ORIF TIBIA PLATEAU Left 01/15/2018   Procedure: OPEN REDUCTION INTERNAL FIXATION (ORIF) TIBIAL PLATEAU;  Surgeon: Altamese Ch[place], MD;  Location: [place];  Service: Orthopedics;  Laterality: Left;   TRANSTHORACIC ECHOCARDIOGRAM  07/2016  EF 60-65%, No RWMA. Mod Concentric LVH - Gr 2 DD. Severe LA dilation. PAP ~35 mmHg (mild Pulm HTN)  --> no changes noted 1 month later   Family History  Family History  Problem Relation Age of Onset   Heart failure Mother        Died at age 60.   Heart attack Mother 18   Hypertension Mother    Diabetes Mellitus II Mother    Other Father        Unknown   Kidney failure Sister        (Oldest Sister)   Other Other        Multiple siblings have started her heart disease, he is not sure of the details.   CAD Nephew    Social History  reports that he has been smoking cigarettes. He has been smoking an average of .25  packs per day. He has never used smokeless tobacco. He reports current drug use. Frequency: 1.00 time per week. Drugs: Marijuana and Heroin. He reports that he does not drink alcohol. Allergies No Known Allergies Home medications Prior to Admission medications   Medication Sig Start Date End Date Taking? Authorizing Provider  carvedilol (COREG) 6.25 MG tablet Take 1 tablet (6.25 mg total) by mouth 2 (two) times daily with a meal. 10/09/21  Yes Sharen Hones, MD  hydrALAZINE (APRESOLINE) 25 MG tablet Take 1 tablet (25 mg total) by mouth every 8 (eight) hours. DO not take this before dialysis on TTS Patient taking differently: Take 25 mg by mouth daily. 11/02/21  Yes Domenic Polite, MD  LIDOCAINE-PRILOCAINE EX Apply 1 application topically See admin instructions. Prior to dialysis Tuesday,Thursday and saturday   Yes [provider]  Oxycodone HCl 10 MG TABS Take 10 mg by mouth 2 (two) times daily as needed (pain). 10/26/21  Yes [provider]  sevelamer carbonate (RENVELA) 800 MG tablet Take 1,600 mg by mouth with breakfast, with lunch, and with evening meal. 05/12/21  Yes [provider]     Vitals:   11/06/21 1159 11/06/21 1200 11/06/21 1300 11/06/21 1400  BP:  115/77 129/70 116/82  Pulse:  71 71 71  Resp:  14 16 14   Temp: 98 F (36.7 C)     TempSrc: Oral     SpO2:  97% 93% 95%  Weight:      Height:       Exam Gen alert, no distress No rash, cyanosis or gangrene Sclera anicteric, throat clear  No jvd or bruits Chest clear bilat to bases, no rales/ wheezing RRR no MRG Abd soft ntnd no mass or ascites +bs GU normal male MS no joint effusions or deformity Ext no LE or UE edema, no wounds or ulcers Neuro is alert, Ox 3 , nf  LUA AVF+bruit    Home meds include - coreg, hydralazine, oxy IR prn, renvela 2 ac tid     OP HD: [facility]  3h 36min  (65kg - as of 11/01/21)  450/ 1.5  2/2 bath  LUA AVF   Hep none  - pending meds   Assessment/ Plan: UGIB /  bleeding duodenal ulcer - sp hemospray then GDA embolization 1/21. Better. On PPI IV.  Anemia of ABL - Hb 7.5- 8.5 range today, transfuse prn.  Anemia ckd - get records, tranfuse prn ESRD - on HD TTS. Missed HD on 1/21. Labs / vol stable, no urgent HD need, plan HD tomorrow then again TTS to get back on schedule. NO heparin  w/ HD. BP/volume -  low BP's yesterday have improved, normal today, no BP lowering meds needed yet though. Holding home coreg/ hydralazine. No vol overload on exam.  MBD ckd - cont binder renvela, get records. Ca in range, phos high at 8    [employer]  MD 11/06/2021, 2:24 PM  Recent Labs  Lab 11/04/21 2350 11/04/21 2358 11/06/21 0359 11/06/21 1200  WBC 11.1*  --  13.4*  --   HGB 7.8*   < > 8.0* 7.7*   < > = values in this interval not displayed.   Recent Labs  Lab 11/01/21 0304 11/02/21 0439 11/05/21 0932 11/05/21 1043 11/06/21 0359  K 5.7*   < > 4.5 4.6 4.8  BUN 61*   < > 79*  --  111*  CREATININE 7.82*   < > 6.11*  --  6.92*  CALCIUM 8.8*   < > 6.8*  --  7.6*  PHOS 8.7*  --   --   --   --    < > = values in this interval not displayed.

## 2021-11-06 NOTE — Progress Notes (Signed)
Patient complaining of right hand hurting. Stating, "take this shit out or I will rip it out myself". Patient pointing at IV and arterial line. Spoke with Dr. Verlee Monte who states that patient no longer needs arterial line and can stop the Protonix infusion.

## 2021-11-06 NOTE — Progress Notes (Signed)
Patient O2 saturation dropping to low 80s when sleeping, tried to place nasal canula on patient, patient refused and swung his arm. Educated patient on importance of supplemental oxygen, will continue to monitor.

## 2021-11-06 NOTE — Progress Notes (Signed)
NAME:  Timothy [name], MRN:  [id number], DOB:  04-24-1961, LOS: 1 ADMISSION DATE:  11/04/2021, CONSULTATION DATE:  11/05/21 REFERRING MD:  Sheran Luz DO , CHIEF COMPLAINT:   Acute GI bleed  History of Present Illness:  61 year old with history of end-stage renal disease, hypertension, HFrEF, colon cancer and polysubstance abuse admitted with syncope episode with hematemesis, large bloody BMs.  He received 2 units of PRBC and taken emergently for EGD by Dr. Benson Norway.  EGD findings are large duodenal actively bleeding ulcer with visible which cannot be controlled.  IR has been called for embolization.  He is intubated and PCCM called for admission to ICU  Pertinent  Medical History    has a past medical history of Anemia of chronic kidney failure, BPH (benign prostatic hyperplasia), Colon cancer ([place]) (2014), End stage renal disease on dialysis ([place]) (2017), Hypertension, Hypertensive heart disease with chronic diastolic congestive heart failure ([place]) (07/17/2016), and Polysubstance abuse ([place]).   Significant Hospital Events: Including procedures, antibiotic start and stop dates in addition to other pertinent events   1/21 admitted with acute GI bleed, active duodenal ulcer with bleeding resolved.  Status post EGD.  IR called for embolization  Interim History / Subjective:   No bloody bowel movements overnight, Hb stable, hemodynamically stable. Looking good on PS trial this morning.  Objective   Blood pressure (!) 96/59, pulse 76, temperature 97.7 F (36.5 C), temperature source Oral, resp. rate 15, height 6\' 1"  (1.854 m), weight 69.9 kg, SpO2 99 %.    Vent Mode: PRVC FiO2 (%):  [45 %-50 %] 45 % Set Rate:  [14 bmp] 14 bmp Vt Set:  [640 mL] 640 mL PEEP:  [5 cmH20-8 cmH20] 5 cmH20 Plateau Pressure:  [16 cmH20-19 cmH20] 18 cmH20   Intake/Output Summary (Last 24 hours) at 11/06/2021 4098 Last data filed at 11/06/2021 0800 Gross per 24 hour  Intake 681.38 ml  Output --  Net 681.38 ml    Filed Weights   11/05/21 0930  Weight: 69.9 kg    Examination: General appearance: 61 y.o., male, alert, chronically ill appearing Eyes: PERRL, tracking appropriately HENT: MMM Lungs: coarse bilaterally, equal chest rise, good tidal volumes on PS 5/5 CV: RRR, no murmur  Abdomen: Soft, non-tender; a little distended but not taut, BS present  Extremities: No peripheral edema, warm Neuro: Follows commands, grossly no focal deficit    Labs/imaging reviewed Hb stable  Resolved Hospital Problem list     Assessment & Plan:  Upper GI bleed, bleeding duodenal ulcer S/p EGD and GDA embolization. If rebleeds will need surgery consult. -Continue PPI drip for now -Transfuse PRBC, goal hemoglobin greater than 7  Post-op vent management: - Extubate  End-stage renal disease History of nonadherence with dialysis with recent admission for volume overload -Nephrology paged  Cardiomyopathy, heart failure EF 40 to 45% -Holding coreg for now  [employer] (right click and "Reselect all SmartList Selections" daily)   Diet/type: NPO DVT prophylaxis: not indicated GI prophylaxis: PPI Lines: Arterial Line Foley:  N/A Code Status:  full code Last date of multidisciplinary goals of care discussion []   Critical care time: 35 minutes   The patient is critically ill with multiple organ system failure and requires high complexity decision making for assessment and support, frequent evaluation and titration of therapies, advanced monitoring, review of radiographic studies and interpretation of complex data.   Critical Care Time devoted to patient care services, exclusive of separately billable procedures, described in this note is 45 minutes.  [place] for pager  If no response to pager , please call [phone] until 7pm After 7:00 pm call Elink  [phone] 11/06/2021, 9:43 AM

## 2021-11-06 NOTE — TOC Progression Note (Signed)
Transition of Care [facility]) - Progression Note    Patient Details  Name: Jamisen Duerson MRN: [id number] Date of Birth: November 05, 1960  Transition of Care [facility]) CM/SW Contact  Zenon Mayo, RN Phone Number: 11/06/2021, 1:20 PM  Clinical Narrative:     Transition of Care [facility]) Screening Note   Patient Details  Name: Saahas Hidrogo Date of Birth: August 26, 1961   Transition of Care [facility]) CM/SW Contact:    Zenon Mayo, RN Phone Number: 11/06/2021, 1:20 PM    Transition of Care Department [facility]) has reviewed patient and no TOC needs have been identified at this time. We will continue to monitor patient advancement through interdisciplinary progression rounds. If new patient transition needs arise, please place a TOC consult.          Expected Discharge Plan and Services                                                 Social Determinants of Health (SDOH) Interventions    Readmission Risk Interventions No flowsheet data found.

## 2021-11-06 NOTE — Progress Notes (Signed)
Subjective: No acute events.  Objective: Vital signs in last 24 hours: Temp:  [97.4 F (36.3 C)-98.8 F (37.1 C)] 97.8 F (36.6 C) (01/22 0300) Pulse Rate:  [63-70] 67 (01/22 0700) Resp:  [0-19] 16 (01/22 0700) BP: (85-118)/(46-77) 102/71 (01/22 0700) SpO2:  [90 %-100 %] 100 % (01/22 0700) Arterial Line BP: (105-143)/(45-75) 121/60 (01/22 0700) FiO2 (%):  [40 %-50 %] 50 % (01/22 0400) Weight:  [69.9 kg] 69.9 kg (01/21 0930) Last BM Date: 11/05/21  Intake/Output from previous day: 01/21 0701 - 01/22 0700 In: 1644.5 [I.V.:651.5; Blood:993] Out: -  Intake/Output this shift: No intake/output data recorded.  General appearance: intubated, sedated, responsive to touch Resp: clear to auscultation bilaterally Cardio: regular rate and rhythm GI: soft, non-tender; bowel sounds normal; no masses,  no organomegaly Extremities: extremities normal, atraumatic, no cyanosis or edema  Lab Results: Recent Labs    11/04/21 2350 11/04/21 2358 11/05/21 1809 11/06/21 0041 11/06/21 0359  WBC 11.1*  --   --   --  13.4*  HGB 7.8*   < > 7.5* 7.4* 8.0*  HCT 24.0*   < > 22.3* 21.9* 22.9*  PLT 310  --   --   --  206   < > = values in this interval not displayed.   BMET Recent Labs    11/04/21 2350 11/04/21 2358 11/05/21 0842 11/05/21 0932 11/05/21 1043 11/06/21 0359  NA 134*   < > 133* 133* 134* 132*  K 4.2   < > 5.0 4.5 4.6 4.8  CL 92*   < > 95* 97*  --  94*  CO2 28  --   --  25  --  22  GLUCOSE 89   < > 118* 118*  --  88  BUN 57*   < > 82* 79*  --  111*  CREATININE 6.62*   < > 5.90* 6.11*  --  6.92*  CALCIUM 7.6*  --   --  6.8*  --  7.6*   < > = values in this interval not displayed.   LFT Recent Labs    11/04/21 2350  PROT 6.4*  ALBUMIN 2.3*  AST 104*  ALT 116*  ALKPHOS 75  BILITOT 0.9   PT/INR Recent Labs    11/04/21 2350  LABPROT 18.0*  INR 1.5*   Hepatitis Panel No results for input(s): HEPBSAG, HCVAB, HEPAIGM, HEPBIGM in the last 72 hours. C-Diff No  results for input(s): CDIFFTOX in the last 72 hours. Fecal Lactopherrin No results for input(s): FECLLACTOFRN in the last 72 hours.  Studies/Results: CT ABDOMEN PELVIS WO CONTRAST  Result Date: 11/05/2021 CLINICAL DATA:  Status post trauma. EXAM: CT ABDOMEN AND PELVIS WITHOUT CONTRAST TECHNIQUE: Multidetector CT imaging of the abdomen and pelvis was performed following the standard protocol without IV contrast. RADIATION DOSE REDUCTION: This exam was performed according to the departmental dose-optimization program which includes automated exposure control, adjustment of the mA and/or kV according to patient size and/or use of iterative reconstruction technique. COMPARISON:  June 25, 2021 FINDINGS: Lower chest: There is mild cardiomegaly with a large (approximately 1.5 cm) pericardial effusion. This represents a new finding. Diffusely increased attenuation of the myocardium is noted. Mild to moderate severity areas of atelectasis are seen within the bilateral lower lobes. Hepatobiliary: Diffusely increased attenuation of the liver parenchyma is noted no focal liver abnormality is seen. Numerous gallstones are seen within a partially contracted gallbladder, without evidence of gallbladder wall thickening or biliary dilatation. Pancreas: There is mild pancreatic ductal  dilatation (approximately 2 mm), without evidence of surrounding inflammation. Spleen: Normal in size without focal abnormality. Adrenals/Urinary Tract: Adrenal glands are unremarkable. Innumerable bilateral renal cysts are seen without obstructing renal calculi or hydronephrosis. The urinary bladder is empty and subsequently limited in evaluation. Stomach/Bowel: Mild to moderate severity diffuse thickening of the distal esophageal wall is seen. A large amount of ingested material is seen throughout the body of the stomach. Surgically anastomosed bowel is seen within the right anterior aspect of the right upper quadrant and medial aspect of  the lower right abdomen, consistent with prior right hemicolectomy. No evidence of bowel dilatation. Vascular/Lymphatic: Aortic atherosclerosis. No enlarged abdominal or pelvic lymph nodes. Reproductive: Prostate is unremarkable. Other: No abdominal wall hernia or abnormality. No abdominopelvic ascites. Musculoskeletal: No acute or significant osseous findings. IMPRESSION: 1. Mild cardiomegaly with a large (approximately 1.5 cm) pericardial effusion. This represents a new finding. 2. Diffusely increased attenuation of the myocardium and liver, consistent with hemochromatosis. 3. Cholelithiasis. 4. Innumerable bilateral renal cysts. 5. Mild to moderate severity diffuse thickening of the distal esophageal wall, which may represent sequelae associated with esophagitis. 6. Findings consistent with prior right hemicolectomy. 7. Aortic atherosclerosis. Aortic Atherosclerosis (ICD10-I70.0). Electronically Signed   By: Virgina Norfolk M.D.   On: 11/05/2021 01:23   CT HEAD WO CONTRAST (5MM)  Result Date: 11/05/2021 CLINICAL DATA:  Fall, head and neck trauma. EXAM: CT HEAD WITHOUT CONTRAST CT CERVICAL SPINE WITHOUT CONTRAST TECHNIQUE: Multidetector CT imaging of the head and cervical spine was performed following the standard protocol without intravenous contrast. Multiplanar CT image reconstructions of the cervical spine were also generated. RADIATION DOSE REDUCTION: This exam was performed according to the departmental dose-optimization program which includes automated exposure control, adjustment of the mA and/or kV according to patient size and/or use of iterative reconstruction technique. COMPARISON:  None. FINDINGS: CT HEAD FINDINGS Brain: No acute hemorrhage, midline shift or mass effect. No extra-axial fluid collection. Mild cerebral atrophy is noted. Vascular: No hyperdense vessel or unexpected calcification. Skull: Normal. Negative for fracture or focal lesion. Sinuses/Orbits: No acute finding. Other: None. CT  CERVICAL SPINE FINDINGS Alignment: There is mild retrolisthesis at C4-C5. Skull base and vertebrae: No acute fracture. No primary bone lesion or focal pathologic process. Soft tissues and spinal canal: No prevertebral fluid or swelling. No visible canal hematoma. Disc levels: Intervertebral disc space narrowing and degenerative endplate changes are seen predominantly at C4-C5 and C5-C6 resulting in moderate to severe spinal canal and neural foraminal stenosis. Upper chest: Negative. Other: Atherosclerotic calcification of the carotid arteries. IMPRESSION: 1. No acute intracranial process. 2. No acute fracture in the cervical spine. 3. 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 Electronically Signed   By: Brett Fairy M.D.   On: 11/05/2021 01:21   CT Cervical Spine Wo Contrast  Result Date: 11/05/2021 CLINICAL DATA:  Fall, head and neck trauma. EXAM: CT HEAD WITHOUT CONTRAST CT CERVICAL SPINE WITHOUT CONTRAST TECHNIQUE: Multidetector CT imaging of the head and cervical spine was performed following the standard protocol without intravenous contrast. Multiplanar CT image reconstructions of the cervical spine were also generated. RADIATION DOSE REDUCTION: This exam was performed according to the departmental dose-optimization program which includes automated exposure control, adjustment of the mA and/or kV according to patient size and/or use of iterative reconstruction technique. COMPARISON:  None. FINDINGS: CT HEAD FINDINGS Brain: No acute hemorrhage, midline shift or mass effect. No extra-axial fluid collection. Mild cerebral atrophy is noted. Vascular: No hyperdense vessel or unexpected calcification.  Skull: Normal. Negative for fracture or focal lesion. Sinuses/Orbits: No acute finding. Other: None. CT CERVICAL SPINE FINDINGS Alignment: There is mild retrolisthesis at C4-C5. Skull base and vertebrae: No acute fracture. No primary bone lesion or focal pathologic process. Soft tissues and  spinal canal: No prevertebral fluid or swelling. No visible canal hematoma. Disc levels: Intervertebral disc space narrowing and degenerative endplate changes are seen predominantly at C4-C5 and C5-C6 resulting in moderate to severe spinal canal and neural foraminal stenosis. Upper chest: Negative. Other: Atherosclerotic calcification of the carotid arteries. IMPRESSION: 1. No acute intracranial process. 2. No acute fracture in the cervical spine. 3. 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 Electronically Signed   By: Brett Fairy M.D.   On: 11/05/2021 01:21   IR Angiogram Visceral Selective  Result Date: 11/05/2021 INDICATION: Emergent acute upper GI bleed secondary to endoscopy proven duodenal bulb ulcer. Endoscopic hemostasis obtained. Patient now presents for emergent GDA coil embolization. EXAM: ULTRASOUND GUIDANCE FOR VASCULAR ACCESS CELIAC, COMMON HEPATIC, AND GDA ARTERIAL MICRO CATHETERIZATION AND ANGIOGRAMS SUCCESSFUL GDA MICRO COIL EMBOLIZATION MEDICATIONS: NONE. ANESTHESIA/SEDATION: GENERAL ANESTHESIA CONTRAST:  45 cc omni 300 FLUOROSCOPY TIME:  Fluoroscopy Time: 11 minutes 42 seconds (489 mGy). COMPLICATIONS: None immediate. PROCEDURE: Informed consent was obtained from the patient following explanation of the procedure, risks, benefits and alternatives. The patient understands, agrees and consents for the procedure. All questions were addressed. A time out was performed prior to the initiation of the procedure. Maximal barrier sterile technique utilized including caps, mask, sterile gowns, sterile gloves, large sterile drape, hand hygiene, and Betadine prep. Under sterile conditions, ultrasound micropuncture access performed the right common femoral artery. Images obtained for documentation. 018 guidewire inserted followed by the micro dilator. Five French sheath inserted over a Bentson guidewire. Initially C2 catheter was advanced to select the celiac origin. Catheter position  was unstable. Catheter exchanged for a Mickelson catheter. Mickelson catheter utilized to re select the celiac origin. Selective celiac angiogram performed. Celiac: Mild atherosclerotic change of the celiac origin. Celiac axis remains patent. Celiac branches including the splenic, common hepatic, GDA, proper hepatic, and left and right hepatic arteries are all patent. Left gastric artery is also visualized and patent. No active bleeding demonstrated by angiography. Through the [facility] catheter, a Renegade STC catheter was advanced initially into the common hepatic artery. Common hepatic angiogram performed. Common hepatic: The common, proper, right and left hepatic arteries are visualized and patent. Catheter advanced over a double angled GT Glidewire into the GDA. GDA angiogram performed. GDA: GDA is patent. There is some irregular focal outpouching of the distal GDA noted but without active contrast extravasation. This can be seen with duodenal ulcer disease. Gastroepiploic vasculature is patent. There is vaso constriction throughout the pancreaticoduodenal arcade. GDA micro coil embolization: For coil embolization, 3, 4, and 5 mm IDC 2D interlock coils were deployed. A total of 5 coils were successfully deployed. Post embolization angiogram confirms near complete stasis within the GDA vascular territory. Again, no active arterial GI bleeding demonstrated by angiography. Catheter access removed. Hemostasis obtained with manual compression due to the degree of calcific atherosclerosis in the femoral artery. Patient tolerated procedure well. No immediate complication. IMPRESSION: Successful preventative GDA micro coil embolization emergently for an endoscopically proven duodenal bulb bleeding ulcer. Electronically Signed   By: Jerilynn Mages.  Shick M.D.   On: 11/05/2021 09:36   IR US Guide Vasc Access Right  Result Date: 11/05/2021 INDICATION: Emergent acute upper GI bleed secondary to endoscopy proven duodenal bulb  ulcer. Endoscopic hemostasis obtained. Patient now presents for emergent GDA coil embolization. EXAM: ULTRASOUND GUIDANCE FOR VASCULAR ACCESS CELIAC, COMMON HEPATIC, AND GDA ARTERIAL MICRO CATHETERIZATION AND ANGIOGRAMS SUCCESSFUL GDA MICRO COIL EMBOLIZATION MEDICATIONS: NONE. ANESTHESIA/SEDATION: GENERAL ANESTHESIA CONTRAST:  45 cc omni 300 FLUOROSCOPY TIME:  Fluoroscopy Time: 11 minutes 42 seconds (489 mGy). COMPLICATIONS: None immediate. PROCEDURE: Informed consent was obtained from the patient following explanation of the procedure, risks, benefits and alternatives. The patient understands, agrees and consents for the procedure. All questions were addressed. A time out was performed prior to the initiation of the procedure. Maximal barrier sterile technique utilized including caps, mask, sterile gowns, sterile gloves, large sterile drape, hand hygiene, and Betadine prep. Under sterile conditions, ultrasound micropuncture access performed the right common femoral artery. Images obtained for documentation. 018 guidewire inserted followed by the micro dilator. Five French sheath inserted over a Bentson guidewire. Initially C2 catheter was advanced to select the celiac origin. Catheter position was unstable. Catheter exchanged for a Mickelson catheter. Mickelson catheter utilized to re select the celiac origin. Selective celiac angiogram performed. Celiac: Mild atherosclerotic change of the celiac origin. Celiac axis remains patent. Celiac branches including the splenic, common hepatic, GDA, proper hepatic, and left and right hepatic arteries are all patent. Left gastric artery is also visualized and patent. No active bleeding demonstrated by angiography. Through the [facility] catheter, a Renegade STC catheter was advanced initially into the common hepatic artery. Common hepatic angiogram performed. Common hepatic: The common, proper, right and left hepatic arteries are visualized and patent. Catheter advanced over  a double angled GT Glidewire into the GDA. GDA angiogram performed. GDA: GDA is patent. There is some irregular focal outpouching of the distal GDA noted but without active contrast extravasation. This can be seen with duodenal ulcer disease. Gastroepiploic vasculature is patent. There is vaso constriction throughout the pancreaticoduodenal arcade. GDA micro coil embolization: For coil embolization, 3, 4, and 5 mm IDC 2D interlock coils were deployed. A total of 5 coils were successfully deployed. Post embolization angiogram confirms near complete stasis within the GDA vascular territory. Again, no active arterial GI bleeding demonstrated by angiography. Catheter access removed. Hemostasis obtained with manual compression due to the degree of calcific atherosclerosis in the femoral artery. Patient tolerated procedure well. No immediate complication. IMPRESSION: Successful preventative GDA micro coil embolization emergently for an endoscopically proven duodenal bulb bleeding ulcer. Electronically Signed   By: Jerilynn Mages.  Shick M.D.   On: 11/05/2021 09:36   DG Chest Port 1 View  Result Date: 11/05/2021 CLINICAL DATA:  Weakness, vomiting blood. EXAM: PORTABLE CHEST 1 VIEW COMPARISON:  None. FINDINGS: The heart is enlarged the mediastinal contours within normal limits. Atherosclerotic calcification of the aorta is noted. The pulmonary vasculature is distended. Interstitial prominence is noted bilaterally. Subsegmental atelectasis is noted at the right lung base. No effusion or pneumothorax. No acute osseous abnormality. IMPRESSION: 1. Cardiomegaly with pulmonary vascular congestion. 2. Interstitial prominence bilaterally, possible edema or infiltrate. Electronically Signed   By: Brett Fairy M.D.   On: 11/05/2021 01:22   IR EMBO ART  VEN HEMORR LYMPH EXTRAV  INC GUIDE ROADMAPPING  Result Date: 11/05/2021 INDICATION: Emergent acute upper GI bleed secondary to endoscopy proven duodenal bulb ulcer. Endoscopic hemostasis  obtained. Patient now presents for emergent GDA coil embolization. EXAM: ULTRASOUND GUIDANCE FOR VASCULAR ACCESS CELIAC, COMMON HEPATIC, AND GDA ARTERIAL MICRO CATHETERIZATION AND ANGIOGRAMS SUCCESSFUL GDA MICRO COIL EMBOLIZATION MEDICATIONS: NONE. ANESTHESIA/SEDATION: GENERAL ANESTHESIA CONTRAST:  45 cc omni 300 FLUOROSCOPY TIME:  Fluoroscopy  Time: 11 minutes 42 seconds (489 mGy). COMPLICATIONS: None immediate. PROCEDURE: Informed consent was obtained from the patient following explanation of the procedure, risks, benefits and alternatives. The patient understands, agrees and consents for the procedure. All questions were addressed. A time out was performed prior to the initiation of the procedure. Maximal barrier sterile technique utilized including caps, mask, sterile gowns, sterile gloves, large sterile drape, hand hygiene, and Betadine prep. Under sterile conditions, ultrasound micropuncture access performed the right common femoral artery. Images obtained for documentation. 018 guidewire inserted followed by the micro dilator. Five French sheath inserted over a Bentson guidewire. Initially C2 catheter was advanced to select the celiac origin. Catheter position was unstable. Catheter exchanged for a Mickelson catheter. Mickelson catheter utilized to re select the celiac origin. Selective celiac angiogram performed. Celiac: Mild atherosclerotic change of the celiac origin. Celiac axis remains patent. Celiac branches including the splenic, common hepatic, GDA, proper hepatic, and left and right hepatic arteries are all patent. Left gastric artery is also visualized and patent. No active bleeding demonstrated by angiography. Through the [facility] catheter, a Renegade STC catheter was advanced initially into the common hepatic artery. Common hepatic angiogram performed. Common hepatic: The common, proper, right and left hepatic arteries are visualized and patent. Catheter advanced over a double angled GT Glidewire  into the GDA. GDA angiogram performed. GDA: GDA is patent. There is some irregular focal outpouching of the distal GDA noted but without active contrast extravasation. This can be seen with duodenal ulcer disease. Gastroepiploic vasculature is patent. There is vaso constriction throughout the pancreaticoduodenal arcade. GDA micro coil embolization: For coil embolization, 3, 4, and 5 mm IDC 2D interlock coils were deployed. A total of 5 coils were successfully deployed. Post embolization angiogram confirms near complete stasis within the GDA vascular territory. Again, no active arterial GI bleeding demonstrated by angiography. Catheter access removed. Hemostasis obtained with manual compression due to the degree of calcific atherosclerosis in the femoral artery. Patient tolerated procedure well. No immediate complication. IMPRESSION: Successful preventative GDA micro coil embolization emergently for an endoscopically proven duodenal bulb bleeding ulcer. Electronically Signed   By: Jerilynn Mages.  Shick M.D.   On: 11/05/2021 09:36    Medications: Scheduled:  sodium chloride   Intravenous Once   chlorhexidine gluconate (MEDLINE KIT)  15 mL Mouth Rinse BID   Chlorhexidine Gluconate Cloth  6 each Topical Daily   docusate  100 mg Per Tube BID   mouth rinse  15 mL Mouth Rinse 10 times per day   [START ON 11/08/2021] pantoprazole  40 mg Intravenous Q12H   polyethylene glycol  17 g Per Tube Daily   Continuous:  sodium chloride     erythromycin     pantoprazole 8 mg/hr (11/06/21 0700)   propofol (DIPRIVAN) infusion 50 mcg/kg/min (11/06/21 0700)    Assessment/Plan: 1) Duodenal ulcer with visible vessel with near exsanguination s/p hemospray and GDA embolization. 2) Anemia - stable.   Nursing did not report any bloody bowel movements.  His HGB was stable over the past day.  He remains intubated and sedated.  Plan: 1) Continue with pantoprazole. 2) Follow HGB and transfuse if necessary. 3) If severe bleeding recurs  surgical intervention will be required.  LOS: 1 day   Timothy [name] D 11/06/2021, 7:31 AM

## 2021-11-06 NOTE — Progress Notes (Signed)
Patient arrived to unit via [facility]. Patient Aox4, Vitals WNL and focused assessment completed.    Patient belongings include: phone and charger left with patient at bedside.  Patient orient to unit and room, bed alarm on, in lowest position and call bell in reach.

## 2021-11-07 ENCOUNTER — Encounter (HOSPITAL_COMMUNITY): Payer: Self-pay | Admitting: Gastroenterology

## 2021-11-07 DIAGNOSIS — Z992 Dependence on renal dialysis: Secondary | ICD-10-CM

## 2021-11-07 DIAGNOSIS — D62 Acute posthemorrhagic anemia: Secondary | ICD-10-CM | POA: Diagnosis not present

## 2021-11-07 DIAGNOSIS — D631 Anemia in chronic kidney disease: Secondary | ICD-10-CM

## 2021-11-07 DIAGNOSIS — K264 Chronic or unspecified duodenal ulcer with hemorrhage: Secondary | ICD-10-CM | POA: Diagnosis not present

## 2021-11-07 DIAGNOSIS — N186 End stage renal disease: Secondary | ICD-10-CM

## 2021-11-07 LAB — CBC WITH DIFFERENTIAL/PLATELET
Abs Immature Granulocytes: 0.16 10*3/uL — ABNORMAL HIGH (ref 0.00–0.07)
Basophils Absolute: 0 10*3/uL (ref 0.0–0.1)
Basophils Relative: 0 %
Eosinophils Absolute: 0.1 10*3/uL (ref 0.0–0.5)
Eosinophils Relative: 1 %
HCT: 23.4 % — ABNORMAL LOW (ref 39.0–52.0)
Hemoglobin: 7.9 g/dL — ABNORMAL LOW (ref 13.0–17.0)
Immature Granulocytes: 1 %
Lymphocytes Relative: 6 %
Lymphs Abs: 0.9 10*3/uL (ref 0.7–4.0)
MCH: 28 pg (ref 26.0–34.0)
MCHC: 33.8 g/dL (ref 30.0–36.0)
MCV: 83 fL (ref 80.0–100.0)
Monocytes Absolute: 2.5 10*3/uL — ABNORMAL HIGH (ref 0.1–1.0)
Monocytes Relative: 17 %
Neutro Abs: 11 10*3/uL — ABNORMAL HIGH (ref 1.7–7.7)
Neutrophils Relative %: 75 %
Platelets: 236 10*3/uL (ref 150–400)
RBC: 2.82 MIL/uL — ABNORMAL LOW (ref 4.22–5.81)
RDW: 19.4 % — ABNORMAL HIGH (ref 11.5–15.5)
WBC: 14.8 10*3/uL — ABNORMAL HIGH (ref 4.0–10.5)
nRBC: 0 % (ref 0.0–0.2)

## 2021-11-07 LAB — RENAL FUNCTION PANEL
Albumin: 2.4 g/dL — ABNORMAL LOW (ref 3.5–5.0)
Anion gap: 19 — ABNORMAL HIGH (ref 5–15)
BUN: 151 mg/dL — ABNORMAL HIGH (ref 6–20)
CO2: 19 mmol/L — ABNORMAL LOW (ref 22–32)
Calcium: 7.8 mg/dL — ABNORMAL LOW (ref 8.9–10.3)
Chloride: 94 mmol/L — ABNORMAL LOW (ref 98–111)
Creatinine, Ser: 8.25 mg/dL — ABNORMAL HIGH (ref 0.61–1.24)
GFR, Estimated: 7 mL/min — ABNORMAL LOW (ref >60)
Glucose, Bld: 124 mg/dL — ABNORMAL HIGH (ref 70–99)
Phosphorus: 5.3 mg/dL — ABNORMAL HIGH (ref 2.5–4.6)
Potassium: 4.6 mmol/L (ref 3.5–5.1)
Sodium: 132 mmol/L — ABNORMAL LOW (ref 135–145)

## 2021-11-07 LAB — HEPATIC FUNCTION PANEL
ALT: 66 U/L — ABNORMAL HIGH (ref 0–44)
AST: 48 U/L — ABNORMAL HIGH (ref 15–41)
Albumin: 2.4 g/dL — ABNORMAL LOW (ref 3.5–5.0)
Alkaline Phosphatase: 78 U/L (ref 38–126)
Bilirubin, Direct: 0.5 mg/dL — ABNORMAL HIGH (ref 0.0–0.2)
Indirect Bilirubin: 0.6 mg/dL (ref 0.3–0.9)
Total Bilirubin: 1.1 mg/dL (ref 0.3–1.2)
Total Protein: 6.5 g/dL (ref 6.5–8.1)

## 2021-11-07 LAB — PROTIME-INR
INR: 1.5 — ABNORMAL HIGH (ref 0.8–1.2)
Prothrombin Time: 17.7 seconds — ABNORMAL HIGH (ref 11.4–15.2)

## 2021-11-07 MED ORDER — OXYCODONE HCL 5 MG PO TABS
10.0000 mg | ORAL_TABLET | Freq: Four times a day (QID) | ORAL | Status: AC | PRN
  Administered 2021-11-07 – 2021-11-09 (×5): 10 mg via ORAL
  Filled 2021-11-07 (×5): qty 2

## 2021-11-07 MED ORDER — OXYCODONE HCL 10 MG PO TABS: 10.0000 mg | ORAL_TABLET | Freq: Two times a day (BID) | ORAL | Status: DC | PRN

## 2021-11-07 MED ORDER — DARBEPOETIN ALFA 100 MCG/0.5ML IJ SOSY
100.0000 ug | PREFILLED_SYRINGE | INTRAMUSCULAR | Status: DC
  Administered 2021-11-07: 100 ug via SUBCUTANEOUS
  Filled 2021-11-07: qty 0.5

## 2021-11-07 MED ORDER — DIPHENHYDRAMINE HCL 50 MG/ML IJ SOLN
25.0000 mg | Freq: Once | INTRAMUSCULAR | Status: AC
  Administered 2021-11-07: 25 mg via INTRAVENOUS
  Filled 2021-11-07: qty 1

## 2021-11-07 MED ORDER — FENTANYL CITRATE PF 50 MCG/ML IJ SOSY: 25.0000 ug | PREFILLED_SYRINGE | INTRAMUSCULAR | Status: DC | PRN

## 2021-11-07 MED ORDER — DOXERCALCIFEROL 4 MCG/2ML IV SOLN
1.0000 ug | INTRAVENOUS | Status: DC
  Administered 2021-11-08: 1 ug via INTRAVENOUS
  Filled 2021-11-07 (×2): qty 2

## 2021-11-07 MED ORDER — HYDROXYZINE HCL 10 MG PO TABS
10.0000 mg | ORAL_TABLET | Freq: Three times a day (TID) | ORAL | Status: DC | PRN
  Filled 2021-11-07: qty 1

## 2021-11-07 NOTE — Progress Notes (Signed)
Referring Physician(s): PCCM/ GI   Supervising Physician: Daryll Brod  Patient Status:  [facility] - In-pt  Chief Complaint:  Acute, symptomatic GI bleed s/p celiac and GDA angio with GDA microcoil embo by Dr. Annamaria Boots on 11/05/21.   Subjective:  Pt sitting in bed, NAD.  Reprots he is itching all over, started "when I got up." Denies bloody BM.    Allergies: Patient has no known allergies.  Medications: Prior to Admission medications   Medication Sig Start Date End Date Taking? Authorizing Provider  carvedilol (COREG) 6.25 MG tablet Take 1 tablet (6.25 mg total) by mouth 2 (two) times daily with a meal. 10/09/21  Yes Sharen Hones, MD  hydrALAZINE (APRESOLINE) 25 MG tablet Take 1 tablet (25 mg total) by mouth every 8 (eight) hours. DO not take this before dialysis on TTS Patient taking differently: Take 25 mg by mouth daily. 11/02/21  Yes Domenic Polite, MD  LIDOCAINE-PRILOCAINE EX Apply 1 application topically See admin instructions. Prior to dialysis Tuesday,Thursday and saturday   Yes [provider]  Oxycodone HCl 10 MG TABS Take 10 mg by mouth 2 (two) times daily as needed (pain). 10/26/21  Yes [provider]  sevelamer carbonate (RENVELA) 800 MG tablet Take 1,600 mg by mouth with breakfast, with lunch, and with evening meal. 05/12/21  Yes [provider]     Vital Signs: BP 138/81 (BP Location: Right Arm)    Pulse 88    Temp 97.9 F (36.6 C) (Oral)    Resp (!) 21    Ht 6\' 1"  (1.854 m)    Wt 154 lb 1.6 oz (69.9 kg)    SpO2 95%    BMI 20.33 kg/m   Physical Exam Vitals reviewed.  Constitutional:      General: He is not in acute distress. HENT:     Head: Normocephalic and atraumatic.  Cardiovascular:     Rate and Rhythm: Normal rate and regular rhythm.     Comments: DP 2+ bilaterally  Pulmonary:     Effort: Pulmonary effort is normal.  Skin:    General: Skin is warm and dry.     Coloration: Skin is not cyanotic or jaundiced.     Comments:  Positive dressing on R CFA puncture site. Site is unremarkable with no erythema, edema, tenderness, bleeding or drainage. Minimal amount of old, dry blood noted on the dressing. Dressing otherwise clean, dry, and intact.    Neurological:     Mental Status: He is alert and oriented to person, place, and time.    Imaging: CT ABDOMEN PELVIS WO CONTRAST  Result Date: 11/05/2021 CLINICAL DATA:  Status post trauma. EXAM: CT ABDOMEN AND PELVIS WITHOUT CONTRAST TECHNIQUE: Multidetector CT imaging of the abdomen and pelvis was performed following the standard protocol without IV contrast. RADIATION DOSE REDUCTION: This exam was performed according to the departmental dose-optimization program which includes automated exposure control, adjustment of the mA and/or kV according to patient size and/or use of iterative reconstruction technique. COMPARISON:  June 25, 2021 FINDINGS: Lower chest: There is mild cardiomegaly with a large (approximately 1.5 cm) pericardial effusion. This represents a new finding. Diffusely increased attenuation of the myocardium is noted. Mild to moderate severity areas of atelectasis are seen within the bilateral lower lobes. Hepatobiliary: Diffusely increased attenuation of the liver parenchyma is noted no focal liver abnormality is seen. Numerous gallstones are seen within a partially contracted gallbladder, without evidence of gallbladder wall thickening or biliary dilatation. Pancreas: There is mild pancreatic  ductal dilatation (approximately 2 mm), without evidence of surrounding inflammation. Spleen: Normal in size without focal abnormality. Adrenals/Urinary Tract: Adrenal glands are unremarkable. Innumerable bilateral renal cysts are seen without obstructing renal calculi or hydronephrosis. The urinary bladder is empty and subsequently limited in evaluation. Stomach/Bowel: Mild to moderate severity diffuse thickening of the distal esophageal wall is seen. A large amount of  ingested material is seen throughout the body of the stomach. Surgically anastomosed bowel is seen within the right anterior aspect of the right upper quadrant and medial aspect of the lower right abdomen, consistent with prior right hemicolectomy. No evidence of bowel dilatation. Vascular/Lymphatic: Aortic atherosclerosis. No enlarged abdominal or pelvic lymph nodes. Reproductive: Prostate is unremarkable. Other: No abdominal wall hernia or abnormality. No abdominopelvic ascites. Musculoskeletal: No acute or significant osseous findings. IMPRESSION: 1. Mild cardiomegaly with a large (approximately 1.5 cm) pericardial effusion. This represents a new finding. 2. Diffusely increased attenuation of the myocardium and liver, consistent with hemochromatosis. 3. Cholelithiasis. 4. Innumerable bilateral renal cysts. 5. Mild to moderate severity diffuse thickening of the distal esophageal wall, which may represent sequelae associated with esophagitis. 6. Findings consistent with prior right hemicolectomy. 7. Aortic atherosclerosis. Aortic Atherosclerosis (ICD10-I70.0). Electronically Signed   By: Virgina Norfolk M.D.   On: 11/05/2021 01:23   CT HEAD WO CONTRAST (5MM)  Result Date: 11/05/2021 CLINICAL DATA:  Fall, head and neck trauma. EXAM: CT HEAD WITHOUT CONTRAST CT CERVICAL SPINE WITHOUT CONTRAST TECHNIQUE: Multidetector CT imaging of the head and cervical spine was performed following the standard protocol without intravenous contrast. Multiplanar CT image reconstructions of the cervical spine were also generated. RADIATION DOSE REDUCTION: This exam was performed according to the departmental dose-optimization program which includes automated exposure control, adjustment of the mA and/or kV according to patient size and/or use of iterative reconstruction technique. COMPARISON:  None. FINDINGS: CT HEAD FINDINGS Brain: No acute hemorrhage, midline shift or mass effect. No extra-axial fluid collection. Mild cerebral  atrophy is noted. Vascular: No hyperdense vessel or unexpected calcification. Skull: Normal. Negative for fracture or focal lesion. Sinuses/Orbits: No acute finding. Other: None. CT CERVICAL SPINE FINDINGS Alignment: There is mild retrolisthesis at C4-C5. Skull base and vertebrae: No acute fracture. No primary bone lesion or focal pathologic process. Soft tissues and spinal canal: No prevertebral fluid or swelling. No visible canal hematoma. Disc levels: Intervertebral disc space narrowing and degenerative endplate changes are seen predominantly at C4-C5 and C5-C6 resulting in moderate to severe spinal canal and neural foraminal stenosis. Upper chest: Negative. Other: Atherosclerotic calcification of the carotid arteries. IMPRESSION: 1. No acute intracranial process. 2. No acute fracture in the cervical spine. 3. 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 Electronically Signed   By: Brett Fairy M.D.   On: 11/05/2021 01:21   CT Cervical Spine Wo Contrast  Result Date: 11/05/2021 CLINICAL DATA:  Fall, head and neck trauma. EXAM: CT HEAD WITHOUT CONTRAST CT CERVICAL SPINE WITHOUT CONTRAST TECHNIQUE: Multidetector CT imaging of the head and cervical spine was performed following the standard protocol without intravenous contrast. Multiplanar CT image reconstructions of the cervical spine were also generated. RADIATION DOSE REDUCTION: This exam was performed according to the departmental dose-optimization program which includes automated exposure control, adjustment of the mA and/or kV according to patient size and/or use of iterative reconstruction technique. COMPARISON:  None. FINDINGS: CT HEAD FINDINGS Brain: No acute hemorrhage, midline shift or mass effect. No extra-axial fluid collection. Mild cerebral atrophy is noted. Vascular: No hyperdense vessel or unexpected  calcification. Skull: Normal. Negative for fracture or focal lesion. Sinuses/Orbits: No acute finding. Other: None. CT CERVICAL  SPINE FINDINGS Alignment: There is mild retrolisthesis at C4-C5. Skull base and vertebrae: No acute fracture. No primary bone lesion or focal pathologic process. Soft tissues and spinal canal: No prevertebral fluid or swelling. No visible canal hematoma. Disc levels: Intervertebral disc space narrowing and degenerative endplate changes are seen predominantly at C4-C5 and C5-C6 resulting in moderate to severe spinal canal and neural foraminal stenosis. Upper chest: Negative. Other: Atherosclerotic calcification of the carotid arteries. IMPRESSION: 1. No acute intracranial process. 2. No acute fracture in the cervical spine. 3. 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 Electronically Signed   By: Brett Fairy M.D.   On: 11/05/2021 01:21   IR Angiogram Visceral Selective  Result Date: 11/05/2021 INDICATION: Emergent acute upper GI bleed secondary to endoscopy proven duodenal bulb ulcer. Endoscopic hemostasis obtained. Patient now presents for emergent GDA coil embolization. EXAM: ULTRASOUND GUIDANCE FOR VASCULAR ACCESS CELIAC, COMMON HEPATIC, AND GDA ARTERIAL MICRO CATHETERIZATION AND ANGIOGRAMS SUCCESSFUL GDA MICRO COIL EMBOLIZATION MEDICATIONS: NONE. ANESTHESIA/SEDATION: GENERAL ANESTHESIA CONTRAST:  45 cc omni 300 FLUOROSCOPY TIME:  Fluoroscopy Time: 11 minutes 42 seconds (489 mGy). COMPLICATIONS: None immediate. PROCEDURE: Informed consent was obtained from the patient following explanation of the procedure, risks, benefits and alternatives. The patient understands, agrees and consents for the procedure. All questions were addressed. A time out was performed prior to the initiation of the procedure. Maximal barrier sterile technique utilized including caps, mask, sterile gowns, sterile gloves, large sterile drape, hand hygiene, and Betadine prep. Under sterile conditions, ultrasound micropuncture access performed the right common femoral artery. Images obtained for documentation. 018  guidewire inserted followed by the micro dilator. Five French sheath inserted over a Bentson guidewire. Initially C2 catheter was advanced to select the celiac origin. Catheter position was unstable. Catheter exchanged for a Mickelson catheter. Mickelson catheter utilized to re select the celiac origin. Selective celiac angiogram performed. Celiac: Mild atherosclerotic change of the celiac origin. Celiac axis remains patent. Celiac branches including the splenic, common hepatic, GDA, proper hepatic, and left and right hepatic arteries are all patent. Left gastric artery is also visualized and patent. No active bleeding demonstrated by angiography. Through the [facility] catheter, a Renegade STC catheter was advanced initially into the common hepatic artery. Common hepatic angiogram performed. Common hepatic: The common, proper, right and left hepatic arteries are visualized and patent. Catheter advanced over a double angled GT Glidewire into the GDA. GDA angiogram performed. GDA: GDA is patent. There is some irregular focal outpouching of the distal GDA noted but without active contrast extravasation. This can be seen with duodenal ulcer disease. Gastroepiploic vasculature is patent. There is vaso constriction throughout the pancreaticoduodenal arcade. GDA micro coil embolization: For coil embolization, 3, 4, and 5 mm IDC 2D interlock coils were deployed. A total of 5 coils were successfully deployed. Post embolization angiogram confirms near complete stasis within the GDA vascular territory. Again, no active arterial GI bleeding demonstrated by angiography. Catheter access removed. Hemostasis obtained with manual compression due to the degree of calcific atherosclerosis in the femoral artery. Patient tolerated procedure well. No immediate complication. IMPRESSION: Successful preventative GDA micro coil embolization emergently for an endoscopically proven duodenal bulb bleeding ulcer. Electronically Signed   By: Jerilynn Mages.   Shick M.D.   On: 11/05/2021 09:36   IR US Guide Vasc Access Right  Result Date: 11/05/2021 INDICATION: Emergent acute upper GI bleed secondary to endoscopy proven duodenal  bulb ulcer. Endoscopic hemostasis obtained. Patient now presents for emergent GDA coil embolization. EXAM: ULTRASOUND GUIDANCE FOR VASCULAR ACCESS CELIAC, COMMON HEPATIC, AND GDA ARTERIAL MICRO CATHETERIZATION AND ANGIOGRAMS SUCCESSFUL GDA MICRO COIL EMBOLIZATION MEDICATIONS: NONE. ANESTHESIA/SEDATION: GENERAL ANESTHESIA CONTRAST:  45 cc omni 300 FLUOROSCOPY TIME:  Fluoroscopy Time: 11 minutes 42 seconds (489 mGy). COMPLICATIONS: None immediate. PROCEDURE: Informed consent was obtained from the patient following explanation of the procedure, risks, benefits and alternatives. The patient understands, agrees and consents for the procedure. All questions were addressed. A time out was performed prior to the initiation of the procedure. Maximal barrier sterile technique utilized including caps, mask, sterile gowns, sterile gloves, large sterile drape, hand hygiene, and Betadine prep. Under sterile conditions, ultrasound micropuncture access performed the right common femoral artery. Images obtained for documentation. 018 guidewire inserted followed by the micro dilator. Five French sheath inserted over a Bentson guidewire. Initially C2 catheter was advanced to select the celiac origin. Catheter position was unstable. Catheter exchanged for a Mickelson catheter. Mickelson catheter utilized to re select the celiac origin. Selective celiac angiogram performed. Celiac: Mild atherosclerotic change of the celiac origin. Celiac axis remains patent. Celiac branches including the splenic, common hepatic, GDA, proper hepatic, and left and right hepatic arteries are all patent. Left gastric artery is also visualized and patent. No active bleeding demonstrated by angiography. Through the [facility] catheter, a Renegade STC catheter was advanced initially into  the common hepatic artery. Common hepatic angiogram performed. Common hepatic: The common, proper, right and left hepatic arteries are visualized and patent. Catheter advanced over a double angled GT Glidewire into the GDA. GDA angiogram performed. GDA: GDA is patent. There is some irregular focal outpouching of the distal GDA noted but without active contrast extravasation. This can be seen with duodenal ulcer disease. Gastroepiploic vasculature is patent. There is vaso constriction throughout the pancreaticoduodenal arcade. GDA micro coil embolization: For coil embolization, 3, 4, and 5 mm IDC 2D interlock coils were deployed. A total of 5 coils were successfully deployed. Post embolization angiogram confirms near complete stasis within the GDA vascular territory. Again, no active arterial GI bleeding demonstrated by angiography. Catheter access removed. Hemostasis obtained with manual compression due to the degree of calcific atherosclerosis in the femoral artery. Patient tolerated procedure well. No immediate complication. IMPRESSION: Successful preventative GDA micro coil embolization emergently for an endoscopically proven duodenal bulb bleeding ulcer. Electronically Signed   By: Jerilynn Mages.  Shick M.D.   On: 11/05/2021 09:36   DG Chest Port 1 View  Result Date: 11/05/2021 CLINICAL DATA:  Weakness, vomiting blood. EXAM: PORTABLE CHEST 1 VIEW COMPARISON:  None. FINDINGS: The heart is enlarged the mediastinal contours within normal limits. Atherosclerotic calcification of the aorta is noted. The pulmonary vasculature is distended. Interstitial prominence is noted bilaterally. Subsegmental atelectasis is noted at the right lung base. No effusion or pneumothorax. No acute osseous abnormality. IMPRESSION: 1. Cardiomegaly with pulmonary vascular congestion. 2. Interstitial prominence bilaterally, possible edema or infiltrate. Electronically Signed   By: Brett Fairy M.D.   On: 11/05/2021 01:22   IR EMBO ART  VEN  HEMORR LYMPH EXTRAV  INC GUIDE ROADMAPPING  Result Date: 11/05/2021 INDICATION: Emergent acute upper GI bleed secondary to endoscopy proven duodenal bulb ulcer. Endoscopic hemostasis obtained. Patient now presents for emergent GDA coil embolization. EXAM: ULTRASOUND GUIDANCE FOR VASCULAR ACCESS CELIAC, COMMON HEPATIC, AND GDA ARTERIAL MICRO CATHETERIZATION AND ANGIOGRAMS SUCCESSFUL GDA MICRO COIL EMBOLIZATION MEDICATIONS: NONE. ANESTHESIA/SEDATION: GENERAL ANESTHESIA CONTRAST:  45 cc omni 300 FLUOROSCOPY TIME:  Fluoroscopy Time: 11 minutes 42 seconds (489 mGy). COMPLICATIONS: None immediate. PROCEDURE: Informed consent was obtained from the patient following explanation of the procedure, risks, benefits and alternatives. The patient understands, agrees and consents for the procedure. All questions were addressed. A time out was performed prior to the initiation of the procedure. Maximal barrier sterile technique utilized including caps, mask, sterile gowns, sterile gloves, large sterile drape, hand hygiene, and Betadine prep. Under sterile conditions, ultrasound micropuncture access performed the right common femoral artery. Images obtained for documentation. 018 guidewire inserted followed by the micro dilator. Five French sheath inserted over a Bentson guidewire. Initially C2 catheter was advanced to select the celiac origin. Catheter position was unstable. Catheter exchanged for a Mickelson catheter. Mickelson catheter utilized to re select the celiac origin. Selective celiac angiogram performed. Celiac: Mild atherosclerotic change of the celiac origin. Celiac axis remains patent. Celiac branches including the splenic, common hepatic, GDA, proper hepatic, and left and right hepatic arteries are all patent. Left gastric artery is also visualized and patent. No active bleeding demonstrated by angiography. Through the [facility] catheter, a Renegade STC catheter was advanced initially into the common hepatic  artery. Common hepatic angiogram performed. Common hepatic: The common, proper, right and left hepatic arteries are visualized and patent. Catheter advanced over a double angled GT Glidewire into the GDA. GDA angiogram performed. GDA: GDA is patent. There is some irregular focal outpouching of the distal GDA noted but without active contrast extravasation. This can be seen with duodenal ulcer disease. Gastroepiploic vasculature is patent. There is vaso constriction throughout the pancreaticoduodenal arcade. GDA micro coil embolization: For coil embolization, 3, 4, and 5 mm IDC 2D interlock coils were deployed. A total of 5 coils were successfully deployed. Post embolization angiogram confirms near complete stasis within the GDA vascular territory. Again, no active arterial GI bleeding demonstrated by angiography. Catheter access removed. Hemostasis obtained with manual compression due to the degree of calcific atherosclerosis in the femoral artery. Patient tolerated procedure well. No immediate complication. IMPRESSION: Successful preventative GDA micro coil embolization emergently for an endoscopically proven duodenal bulb bleeding ulcer. Electronically Signed   By: Jerilynn Mages.  Shick M.D.   On: 11/05/2021 09:36    Labs:  CBC: Recent Labs    11/01/21 0304 11/04/21 2350 11/04/21 2358 11/06/21 0041 11/06/21 0359 11/06/21 1200 11/07/21 0859  WBC 7.4 11.1*  --   --  13.4*  --  14.8*  HGB 9.8* 7.8*   < > 7.4* 8.0* 7.7* 7.9*  HCT 29.9* 24.0*   < > 21.9* 22.9* 23.2* 23.4*  PLT 460* 310  --   --  206  --  236   < > = values in this interval not displayed.    COAGS: Recent Labs    11/04/21 2350  INR 1.5*    BMP: Recent Labs    11/02/21 0439 11/04/21 2350 11/04/21 2358 11/05/21 0706 11/05/21 0842 11/05/21 0932 11/05/21 1043 11/06/21 0359  NA 130* 134* 133*   < > 133* 133* 134* 132*  K 5.1 4.2 4.2   < > 5.0 4.5 4.6 4.8  CL 89* 92* 93*  --  95* 97*  --  94*  CO2 22 28  --   --   --  25  --  22   GLUCOSE 82 89 85  --  118* 118*  --  88  BUN 50* 57* 60*  --  82* 79*  --  111*  CALCIUM 8.3* 7.6*  --   --   --  6.8*  --  7.6*  CREATININE 5.51* 6.62* 6.50*  --  5.90* 6.11*  --  6.92*  GFRNONAA 11* 9*  --   --   --  10*  --  8*   < > = values in this interval not displayed.    LIVER FUNCTION TESTS: Recent Labs    06/11/21 1427 06/12/21 0918 06/17/21 1738 06/18/21 0739 06/21/21 0909 06/25/21 0800 10/08/21 1127 11/01/21 0304 11/04/21 2350  BILITOT 0.6  --   --   --  0.6  --   --  1.1 0.9  AST 18  --   --   --  18  --   --  24 104*  ALT 9  --   --   --  15  --   --  15 116*  ALKPHOS 59  --   --   --  67  --   --  91 75  PROT 6.4*  --  5.6*  --  5.3*  --   --  7.4 6.4*  ALBUMIN 2.4*   < >  --    < > 1.6* 1.6* 2.6* 2.7* 2.3*   < > = values in this interval not displayed.    Assessment and Plan:  61 y.o. male with acute, symptomatic GI bleed s/p celiac and GDA angio with GDA microcoil embo by Dr. Annamaria Boots on 11/05/21.   VSS Hgb/HCT stable since the embolization, 7.9/23.4  this morning.  Right CFA site c/d/I, no s/s of infection or bleeding DP 2+ bilaterally   Further treatment plan per PCCM/ TRH/ GI/ Nephrology  Appreciate and agree with the plan.  Please call IR for questions and concerns.    Electronically Signed: Tera Mater, PA-C 11/07/2021, 10:11 AM   I spent a total of 15 Minutes at the the patient's bedside AND on the patient's hospital floor or unit, greater than 50% of which was counseling/coordinating care for s/p celiac and GDA angio with GDA microcoil embo.     This chart was dictated using voice recognition software.  Despite best efforts to proofread,  errors can occur which can change the documentation meaning.

## 2021-11-07 NOTE — Progress Notes (Signed)
Mobility Specialist Progress Note   11/07/21 1620  Mobility  Activity Ambulated with assistance in room;Ambulated with assistance to bathroom  Level of Assistance Minimal assist, patient does 75% or more  Assistive Device Front wheel walker  Distance Ambulated (ft) 64 ft  Activity Response Tolerated well  $Mobility charge 1 Mobility   Received in bed agitated about the scar from his dialysis port, c/o being itchy all over and having pain in lower back(10/10). Mod I to get EOB but requiring min A for the rest of the session d/t lack in attention, focus and impulsiveness. Tx to the BR w/o any fault, successful BM.  Requiring heavy cues for positioning within RW. Returned back to bed w/ all needs met and call bell in reach     [facility] Mobility Specialist Phone Number [phone]

## 2021-11-07 NOTE — Progress Notes (Signed)
Progress Note   Subjective  Chief Complaint: Hematemesis/syncope  Patient lying in bed talking on phone, states he is not being treated well and wants to leave.  States his itching is not being addressed, states this did not happened until blood transfusion and declines another one but also states 2 benadryl and 2 tylneol at home help normally.  Patient complains mainly of the itching and of feet swelling.  Denies nausea, vomiting, AB pain.  Last BM was Sunday, denies melena or hematochezia.     Objective   Vital signs in last 24 hours: Temp:  [97.5 F (36.4 C)-98.8 F (37.1 C)] 97.9 F (36.6 C) (01/23 0307) Pulse Rate:  [69-91] 88 (01/23 0307) Resp:  [14-22] 21 (01/23 0307) BP: (101-149)/(63-92) 138/81 (01/23 0307) SpO2:  [93 %-97 %] 95 % (01/23 0307) Arterial Line BP: (142-162)/(48-67) 162/66 (01/22 1300) Last BM Date: 11/06/21  General:   male in no acute distress, appears chronically ill Heart:  regular rate and rhythm, no murmurs or gallops Pulm: Clear anteriorly; no wheezing, has cough Abdomen:  Soft, slightly distended AB, skin exam scars- well healed vertical lower AB surgical scar, Normal bowel sounds.  no  tenderness, no shifting dullness Extremities:  Without edema. Neurologic:   grossly normal neurologically.   Intake/Output from previous day: 01/22 0701 - 01/23 0700 In: 832.8 [P.O.:720; I.V.:112.8] Out: -  Intake/Output this shift: No intake/output data recorded.  Lab Results: Recent Labs    11/04/21 2350 11/04/21 2358 11/06/21 0359 11/06/21 1200 11/07/21 0859  WBC 11.1*  --  13.4*  --  14.8*  HGB 7.8*   < > 8.0* 7.7* 7.9*  HCT 24.0*   < > 22.9* 23.2* 23.4*  PLT 310  --  206  --  236   < > = values in this interval not displayed.   BMET Recent Labs    11/04/21 2350 11/04/21 2358 11/05/21 0842 11/05/21 0932 11/05/21 1043 11/06/21 0359  NA 134*   < > 133* 133* 134* 132*  K 4.2   < > 5.0 4.5 4.6 4.8  CL 92*   < > 95* 97*  --  94*  CO2  28  --   --  25  --  22  GLUCOSE 89   < > 118* 118*  --  88  BUN 57*   < > 82* 79*  --  111*  CREATININE 6.62*   < > 5.90* 6.11*  --  6.92*  CALCIUM 7.6*  --   --  6.8*  --  7.6*   < > = values in this interval not displayed.   LFT Recent Labs    11/04/21 2350  PROT 6.4*  ALBUMIN 2.3*  AST 104*  ALT 116*  ALKPHOS 75  BILITOT 0.9   PT/INR Recent Labs    11/04/21 2350  LABPROT 18.0*  INR 1.5*    Studies/Results: No results found.  EGD  LA Grade A reflux esophagitis with no bleeding. - A large amount of food (residue) in the stomach. - Non-bleeding duodenal ulcer with a nonbleeding visible vessel ([place] Class IIa). Injected. hemostatic spray applied. - No specimens collected.   Impression/Plan:   61 year old male admitted 11/05/2021 with syncopal episode after large hematemesis and large bloody stools.  Emergently taken for EGD by Dr. Almyra Free found large duodenal bleeding ulcer status post chemo spray and GDA embolization.   Duodenal ulcer S/p hemospray and GDA embolization with Dr. Benson Norway 01/21 01/21 HGB 6.5 s/p went to 7.8  currently stable at 7.9 No biopsies taken, check H pylori serologies Will likely need repeat EGD outpatient Continue PPI BID  Anemia secondary to above with ESRD Baseline 10-11  Elevated LFTs AST 104, ALT 116, normal Alk phos and total bili INR 1.5 on 11/04/21 Has had mild elevation in that past History of treated HCV infection- HCV RNA undectable 06/22/21 AB [place] 06/15/21 - gallstones, sludge, large ascites, and pancreatic duct dilitation CT AB and pelvis 10/2021 cholelithiasis, increased attenuation of liver and myocardium- low iron, low saturation, low TIBC. Continue to monitor LFTs, avoid hepatotoxic medications, recheck INR, can recheck AB [place] is increasing.   End-stage renal disease Itching? Normal bili ? Benefit from atarax  Colon cancer 2014  HFpEF  Polysubstance abuse   Future Appointments  Date Time Provider [place]  11/14/2021  9:15 AM Loel Dubonnet, NP [facility]  03/07/2022  9:20 AM Raulkar, Clide Deutscher, MD [facility]      LOS: 2 days   Vladimir Crofts  11/07/2021, 10:13 AM

## 2021-11-07 NOTE — Progress Notes (Signed)
98- RN noticed that had not received Q6HH since earlier in day. RN placed a stat HH to be taken.   54- Lab notified Rn that patient has refused to have [place] or any labs done. Rn went in to talk to patient to see if lab can draw [place] as we are monitoring levels ever 6 hours. Despite educating patient on the importance of this lab being drawn patient still refused. Pt stated that "he needed relief from itching and that his hemoglobin had nothing to do with the reason why he was in the hospital." RN gave benadryl last at 0250 and is not time for more benadryl.   Patient would like to speak with rounding doctors.

## 2021-11-07 NOTE — Progress Notes (Signed)
PROGRESS NOTE  Timothy [name] UMP:[id number] DOB: 03/23/1961 DOA: 11/04/2021 PCP: [name], MD  HPI/Recap of past 75 hours: 61 year old with history of ESRD, HTN, HFrEF, colon cancer, polysubstance abuse admitted with syncope episode with hematemesis, large bloody Bms, in the ED upon admission.  Pt received 2 units of PRBC and taken emergently for EGD by Dr. Benson Norway on 1/21. Pt was intubated prior emergent EGD. EGD findings are large 2 cm duodenal actively bleeding ulcer with large visible bleeding vessel uncontrolled by endoscopic interventions. IR was consulted and pt subsequently had GDA microcoil embolization which was successful on 1/21.  Patient admitted in the ICU for further stabilization.  [facility] hospitalist assumed care on 11/07/2021.      Today, patient continues to complain of generalized itching, stating he has not received any treatment even after given Benadryl x2.  Patient also complained of bilateral lower extremity edema, on examination, no edema.  No further bleeding noted, however patient has not had a BM recently.  Patient denies any chest pain, shortness of breath, abdominal pain, nausea/vomiting, fever/chills.   Assessment/Plan: Principal Problem:   Upper GI bleed Active Problems:   ESRD on hemodialysis (HCC)   Acute blood loss anemia   Pericardial effusion without cardiac tamponade   Chronic combined systolic and diastolic CHF (congestive heart failure) (HCC)   Anemia in ESRD (end-stage renal disease) (HCC)   GI bleed    Upper GI bleed 2/2 large duodenal ulcer S/p EGD and GDA embolization Emergently intubated prior to procedure, extubated on 1/22, currently on room air Hemoglobin currently stable, no further bleeding noted S/p 2 units of PRBC Continue PPI twice daily Daily CBC, more frequently if any signs of bleeding occurs Transfuse PRBC if hemoglobin less than 7 If rebleeding occurs, patient will need surgery consult Will need repeat EGD as an  outpatient  Acute blood loss anemia Anemia of ESRD Baseline hemoglobin around 10 Management as above  Leukocytosis Noted leukocytosis in the past, ongoing Unknown etiology, afebrile Possibly reactive, no obvious signs of infection Follow WBC closely  ESRD Nephrology consulted, TTS schedule Will receive 1 off schedule HD on 11/07/2021  Hypertension BP stable Continue to hold home Coreg, hydralazine for now  Chronic systolic HF Appears euvolemic Echo 09/2021 showed EF of 40 to 45%, global hypokinesis, small pericardial effusion Fluid management per HD    Estimated body mass index is 20.33 kg/m as calculated from the following:   Height as of this encounter: 6\' 1"  (1.854 m).   Weight as of this encounter: 69.9 kg.     Code Status: Full  Family Communication: None at bedside  Disposition Plan: Status is: Inpatient  Remains inpatient appropriate because: Level of care     Consultants: GI IR PCCM Nephrology   Procedures: Mechanical ventilation EGD GDA embolization  Antimicrobials: None  DVT prophylaxis: SCDs   Objective: Vitals:   11/06/21 2100 11/06/21 2200 11/06/21 2322 11/07/21 0307  BP: 118/78 134/81 129/82 138/81  Pulse: 79 89 91 88  Resp: 17 16 (!) 21 (!) 21  Temp:   98.8 F (37.1 C) 97.9 F (36.6 C)  TempSrc:   Oral Oral  SpO2: 95% 96% 93% 95%  Weight:      Height:        Intake/Output Summary (Last 24 hours) at 11/07/2021 1215 Last data filed at 11/06/2021 2100 Gross per 24 hour  Intake 737.83 ml  Output --  Net 737.83 ml   Filed Weights   11/05/21 0930  Weight: 69.9 kg  Exam: General: NAD, chronically ill-appearing, restless Cardiovascular: S1, S2 present Respiratory: CTAB Abdomen: Soft, nontender, nondistended, bowel sounds present Musculoskeletal: No bilateral pedal edema noted Skin: Normal, noted old scabs Psychiatry: Normal mood, although a bit agitated    Data Reviewed: CBC: Recent Labs  Lab 10/31/21 1459  11/01/21 0304 11/04/21 2350 11/04/21 2358 11/05/21 1809 11/06/21 0041 11/06/21 0359 11/06/21 1200 11/07/21 0859  WBC 7.5 7.4 11.1*  --   --   --  13.4*  --  14.8*  NEUTROABS  --  4.9 8.0*  --   --   --   --   --  11.0*  HGB 10.2* 9.8* 7.8*   < > 7.5* 7.4* 8.0* 7.7* 7.9*  HCT 31.6* 29.9* 24.0*   < > 22.3* 21.9* 22.9* 23.2* 23.4*  MCV 81.9 81.5 83.6  --   --   --  82.7  --  83.0  PLT 451* 460* 310  --   --   --  206  --  236   < > = values in this interval not displayed.   Basic Metabolic Panel: Recent Labs  Lab 11/01/21 0304 11/02/21 0439 11/04/21 2350 11/04/21 2358 11/05/21 0706 11/05/21 9563 11/05/21 0932 11/05/21 1043 11/06/21 0355 11/06/21 0359 11/07/21 0859  NA 131* 130* 134* 133*   < > 133* 133* 134*  --  132* 132*  K 5.7* 5.1 4.2 4.2   < > 5.0 4.5 4.6  --  4.8 4.6  CL 89* 89* 92* 93*  --  95* 97*  --   --  94* 94*  CO2 24 22 28   --   --   --  25  --   --  22 19*  GLUCOSE 97 82 89 85  --  118* 118*  --   --  88 124*  BUN 61* 50* 57* 60*  --  82* 79*  --   --  111* 151*  CREATININE 7.82* 5.51* 6.62* 6.50*  --  5.90* 6.11*  --   --  6.92* 8.25*  CALCIUM 8.8* 8.3* 7.6*  --   --   --  6.8*  --   --  7.6* 7.8*  MG 2.9*  --   --   --   --   --   --   --  2.6*  --   --   PHOS 8.7*  --   --   --   --   --   --   --   --   --  5.3*   < > = values in this interval not displayed.   GFR: Estimated Creatinine Clearance: 9.4 mL/min (A) (by C-G formula based on SCr of 8.25 mg/dL (H)). Liver Function Tests: Recent Labs  Lab 11/01/21 0304 11/04/21 2350 11/07/21 0859  AST 24 104*  --   ALT 15 116*  --   ALKPHOS 91 75  --   BILITOT 1.1 0.9  --   PROT 7.4 6.4*  --   ALBUMIN 2.7* 2.3* 2.4*   No results for input(s): LIPASE, AMYLASE in the last 168 hours. No results for input(s): AMMONIA in the last 168 hours. Coagulation Profile: Recent Labs  Lab 11/04/21 2350  INR 1.5*   Cardiac Enzymes: No results for input(s): CKTOTAL, CKMB, CKMBINDEX, TROPONINI in the last 168  hours. BNP (last 3 results) No results for input(s): PROBNP in the last 8760 hours. HbA1C: No results for input(s): HGBA1C in the last 72 hours. CBG: Recent Labs  Lab 11/06/21 0354 11/06/21 0745 11/06/21 1159 11/06/21 1515 11/06/21 1923  GLUCAP 81 81 88 125* 130*   Lipid Profile: Recent Labs    11/06/21 0359  TRIG 196*   Thyroid Function Tests: No results for input(s): TSH, T4TOTAL, FREET4, T3FREE, THYROIDAB in the last 72 hours. Anemia Panel: No results for input(s): VITAMINB12, FOLATE, FERRITIN, TIBC, IRON, RETICCTPCT in the last 72 hours. Urine analysis:    Component Value Date/Time   COLORURINE YELLOW 05/19/2021 2015   APPEARANCEUR HAZY (A) 05/19/2021 2015   LABSPEC 1.012 05/19/2021 2015   PHURINE 9.0 (H) 05/19/2021 2015   GLUCOSEU NEGATIVE 05/19/2021 2015   HGBUR NEGATIVE 05/19/2021 2015   [place] NEGATIVE 05/19/2021 2015   KETONESUR NEGATIVE 05/19/2021 2015   PROTEINUR >=300 (A) 05/19/2021 2015   NITRITE NEGATIVE 05/19/2021 2015   LEUKOCYTESUR SMALL (A) 05/19/2021 2015   Sepsis Labs: @LABRCNTIP (procalcitonin:4,lacticidven:4)  ) Recent Results (from the past 240 hour(s))  Resp Panel by RT-PCR (Flu A&B, Covid) Nasopharyngeal Swab     Status: None   Collection Time: 10/31/21  7:02 PM   Specimen: Nasopharyngeal Swab; Nasopharyngeal(NP) swabs in vial transport medium  Result Value Ref Range Status   SARS Coronavirus 2 by RT PCR NEGATIVE NEGATIVE Final    Comment: (NOTE) SARS-CoV-2 target nucleic acids are NOT DETECTED.  The SARS-CoV-2 RNA is generally detectable in upper respiratory specimens during the acute phase of infection. The lowest concentration of SARS-CoV-2 viral copies this assay can detect is 138 copies/mL. A negative result does not preclude SARS-Cov-2 infection and should not be used as the sole basis for treatment or other patient management decisions. A negative result may occur with  improper specimen collection/handling, submission of  specimen other than nasopharyngeal swab, presence of viral mutation(s) within the areas targeted by this assay, and inadequate number of viral copies(<138 copies/mL). A negative result must be combined with clinical observations, patient history, and epidemiological information. The expected result is Negative.  Fact Sheet for Patients:  EntrepreneurPulse.com.au  Fact Sheet for Healthcare Providers:  IncredibleEmployment.be  This test is no t yet approved or cleared by the [place] FDA and  has been authorized for detection and/or diagnosis of SARS-CoV-2 by FDA under an Emergency Use Authorization (EUA). This EUA will remain  in effect (meaning this test can be used) for the duration of the COVID-19 declaration under Section 564(b)(1) of the Act, 21 U.S.C.section 360bbb-3(b)(1), unless the authorization is terminated  or revoked sooner.       Influenza A by PCR NEGATIVE NEGATIVE Final   Influenza B by PCR NEGATIVE NEGATIVE Final    Comment: (NOTE) The Xpert Xpress SARS-CoV-2/FLU/RSV plus assay is intended as an aid in the diagnosis of influenza from Nasopharyngeal swab specimens and should not be used as a sole basis for treatment. Nasal washings and aspirates are unacceptable for Xpert Xpress SARS-CoV-2/FLU/RSV testing.  Fact Sheet for Patients: EntrepreneurPulse.com.au  Fact Sheet for Healthcare Providers: IncredibleEmployment.be  This test is not yet approved or cleared by the [place] FDA and has been authorized for detection and/or diagnosis of SARS-CoV-2 by FDA under an Emergency Use Authorization (EUA). This EUA will remain in effect (meaning this test can be used) for the duration of the COVID-19 declaration under Section 564(b)(1) of the Act, 21 U.S.C. section 360bbb-3(b)(1), unless the authorization is terminated or revoked.  Performed at [facility], [place] [address],  [place], [place] [address]   Resp Panel by RT-PCR (Flu A&B, Covid) Nasopharyngeal Swab  Status: None   Collection Time: 11/04/21 11:51 PM   Specimen: Nasopharyngeal Swab; Nasopharyngeal(NP) swabs in vial transport medium  Result Value Ref Range Status   SARS Coronavirus 2 by RT PCR NEGATIVE NEGATIVE Final    Comment: (NOTE) SARS-CoV-2 target nucleic acids are NOT DETECTED.  The SARS-CoV-2 RNA is generally detectable in upper respiratory specimens during the acute phase of infection. The lowest concentration of SARS-CoV-2 viral copies this assay can detect is 138 copies/mL. A negative result does not preclude SARS-Cov-2 infection and should not be used as the sole basis for treatment or other patient management decisions. A negative result may occur with  improper specimen collection/handling, submission of specimen other than nasopharyngeal swab, presence of viral mutation(s) within the areas targeted by this assay, and inadequate number of viral copies(<138 copies/mL). A negative result must be combined with clinical observations, patient history, and epidemiological information. The expected result is Negative.  Fact Sheet for Patients:  EntrepreneurPulse.com.au  Fact Sheet for Healthcare Providers:  IncredibleEmployment.be  This test is no t yet approved or cleared by the [place] FDA and  has been authorized for detection and/or diagnosis of SARS-CoV-2 by FDA under an Emergency Use Authorization (EUA). This EUA will remain  in effect (meaning this test can be used) for the duration of the COVID-19 declaration under Section 564(b)(1) of the Act, 21 U.S.C.section 360bbb-3(b)(1), unless the authorization is terminated  or revoked sooner.       Influenza A by PCR NEGATIVE NEGATIVE Final   Influenza B by PCR NEGATIVE NEGATIVE Final    Comment: (NOTE) The Xpert Xpress SARS-CoV-2/FLU/RSV plus assay is intended as an aid in the diagnosis of  influenza from Nasopharyngeal swab specimens and should not be used as a sole basis for treatment. Nasal washings and aspirates are unacceptable for Xpert Xpress SARS-CoV-2/FLU/RSV testing.  Fact Sheet for Patients: EntrepreneurPulse.com.au  Fact Sheet for Healthcare Providers: IncredibleEmployment.be  This test is not yet approved or cleared by the [place] FDA and has been authorized for detection and/or diagnosis of SARS-CoV-2 by FDA under an Emergency Use Authorization (EUA). This EUA will remain in effect (meaning this test can be used) for the duration of the COVID-19 declaration under Section 564(b)(1) of the Act, 21 U.S.C. section 360bbb-3(b)(1), unless the authorization is terminated or revoked.  Performed at [facility], [place] [address] [place], [place] [address]   MRSA Next Gen by PCR, Nasal     Status: None   Collection Time: 11/05/21  9:38 AM   Specimen: Nasal Mucosa; Nasal Swab  Result Value Ref Range Status   MRSA by PCR Next Gen NOT DETECTED NOT DETECTED Final    Comment: (NOTE) The GeneXpert MRSA Assay (FDA approved for NASAL specimens only), is one component of a comprehensive MRSA colonization surveillance program. It is not intended to diagnose MRSA infection nor to guide or monitor treatment for MRSA infections. Test performance is not FDA approved in patients less than 85 years old. Performed at [facility], [place] [address], [place], [place] [address]       Studies: No results found.  Scheduled Meds:  Chlorhexidine Gluconate Cloth  6 each Topical Daily   Chlorhexidine Gluconate Cloth  6 each Topical Q0600   darbepoetin (ARANESP) injection - NON-DIALYSIS  100 mcg Subcutaneous Q Mon-1800   docusate sodium  100 mg Oral BID   [START ON 11/08/2021] doxercalciferol  1 mcg Intravenous Q T,Th,Sa-HD   lidocaine  1 patch Transdermal Q24H  mouth rinse  15 mL Mouth Rinse BID   pantoprazole  40 mg Oral BID    polyethylene glycol  17 g Oral Daily    Continuous Infusions:   LOS: 2 days     Alma Friendly, MD [facility]  If 7PM-7AM, please contact night-coverage [email or web address] 11/07/2021, 12:15 PM

## 2021-11-07 NOTE — Progress Notes (Signed)
[facility] Progress Note  Subjective: pt seen in SDU room, c/o itching. No bleeding and no SOB  Vitals:   11/06/21 2100 11/06/21 2200 11/06/21 2322 11/07/21 0307  BP: 118/78 134/81 129/82 138/81  Pulse: 79 89 91 88  Resp: 17 16 (!) 21 (!) 21  Temp:   98.8 F (37.1 C) 97.9 F (36.6 C)  TempSrc:   Oral Oral  SpO2: 95% 96% 93% 95%  Weight:      Height:        Exam:  alert, nad   no jvd  Chest cta bilat to bases  Cor reg no RG  Abd soft ntnd no ascites   Ext no LE edema   Alert, NF, ox3  LUA AVF+bruit      Home meds include - coreg, hydralazine, oxy IR prn, renvela 2 ac tid        [facility]  3h 77min  65kg   450/ 1.5  2/2 bath  LUA AVF   Hep none  - hect 1 ug tiw  - mircera 150 ug q2 wks, last 10/18/21, due 1/17  - renvela 2 ac tid   Assessment/ Plan: UGIB / bleeding duodenal ulcer - sp hemospray then GDA embolization 1/21. Better. On PPI IV. Hb ~ 8 again today. Per GI/ pmd.  Anemia ckd - Hb 7.5- 8. Pt is due for esa, ordering darbe 100 ug sq today.  ESRD - on HD TTS. Missed HD on 1/21. HD today off schedule. NO heparin w/ HD. Short HD again tomorrow to get back on schedule.  BP/volume -  hypotensive on admit, BP's normal today. Holding home coreg/ hydralazine. No vol overload on exam. +5kg by wt. MBD ckd - cont vdra. Ca in range, phos high at 8. Cont sevelamer 2 ac when eating.    Rob Adren Dollins 11/07/2021, 6:11 AM   Recent Labs  Lab 11/01/21 0304 11/02/21 0439 11/05/21 0932 11/05/21 1043 11/05/21 1210 11/06/21 0359 11/06/21 1200  K 5.7*   < > 4.5 4.6  --  4.8  --   BUN 61*   < > 79*  --   --  111*  --   CREATININE 7.82*   < > 6.11*  --   --  6.92*  --   CALCIUM 8.8*   < > 6.8*  --   --  7.6*  --   PHOS 8.7*  --   --   --   --   --   --   HGB 9.8*   < > 7.4* 7.5*   < > 8.0* 7.7*   < > = values in this interval not displayed.   Inpatient medications:  Chlorhexidine Gluconate Cloth  6 each Topical Daily   Chlorhexidine Gluconate Cloth   6 each Topical Q0600   docusate sodium  100 mg Oral BID   lidocaine  1 patch Transdermal Q24H   mouth rinse  15 mL Mouth Rinse BID   pantoprazole  40 mg Oral BID   polyethylene glycol  17 g Oral Daily    acetaminophen **OR** acetaminophen, diphenhydrAMINE, fentaNYL (SUBLIMAZE) injection, ondansetron **OR** ondansetron (ZOFRAN) IV, oxyCODONE

## 2021-11-08 ENCOUNTER — Inpatient Hospital Stay (HOSPITAL_COMMUNITY): Payer: Medicare (Managed Care)

## 2021-11-08 LAB — TYPE AND SCREEN
ABO/RH(D): B POS
Antibody Screen: NEGATIVE
Unit division: 0
Unit division: 0
Unit division: 0
Unit division: 0
Unit division: 0
Unit division: 0
Unit division: 0
Unit division: 0

## 2021-11-08 LAB — CBC WITH DIFFERENTIAL/PLATELET
Abs Immature Granulocytes: 0.25 10*3/uL — ABNORMAL HIGH (ref 0.00–0.07)
Basophils Absolute: 0 10*3/uL (ref 0.0–0.1)
Basophils Relative: 0 %
Eosinophils Absolute: 0.1 10*3/uL (ref 0.0–0.5)
Eosinophils Relative: 1 %
HCT: 23.9 % — ABNORMAL LOW (ref 39.0–52.0)
Hemoglobin: 8.1 g/dL — ABNORMAL LOW (ref 13.0–17.0)
Immature Granulocytes: 2 %
Lymphocytes Relative: 7 %
Lymphs Abs: 1.1 10*3/uL (ref 0.7–4.0)
MCH: 28.1 pg (ref 26.0–34.0)
MCHC: 33.9 g/dL (ref 30.0–36.0)
MCV: 83 fL (ref 80.0–100.0)
Monocytes Absolute: 2.6 10*3/uL — ABNORMAL HIGH (ref 0.1–1.0)
Monocytes Relative: 17 %
Neutro Abs: 10.8 10*3/uL — ABNORMAL HIGH (ref 1.7–7.7)
Neutrophils Relative %: 73 %
Platelets: 235 10*3/uL (ref 150–400)
RBC: 2.88 MIL/uL — ABNORMAL LOW (ref 4.22–5.81)
RDW: 20.1 % — ABNORMAL HIGH (ref 11.5–15.5)
WBC: 14.8 10*3/uL — ABNORMAL HIGH (ref 4.0–10.5)
nRBC: 0 % (ref 0.0–0.2)

## 2021-11-08 LAB — BPAM RBC
Blood Product Expiration Date: 202302092359
Blood Product Expiration Date: 202302092359
Blood Product Expiration Date: 202302102359
Blood Product Expiration Date: 202302102359
Blood Product Expiration Date: 202302102359
Blood Product Expiration Date: 202302102359
Blood Product Expiration Date: 202302122359
Blood Product Expiration Date: 202302132359
ISSUE DATE / TIME: 202301210203
ISSUE DATE / TIME: 202301210534
ISSUE DATE / TIME: 202301210647
ISSUE DATE / TIME: 202301210647
Unit Type and Rh: 7300
Unit Type and Rh: 7300
Unit Type and Rh: 7300
Unit Type and Rh: 7300
Unit Type and Rh: 7300
Unit Type and Rh: 7300
Unit Type and Rh: 7300
Unit Type and Rh: 7300

## 2021-11-08 LAB — RENAL FUNCTION PANEL
Albumin: 2.5 g/dL — ABNORMAL LOW (ref 3.5–5.0)
Anion gap: 21 — ABNORMAL HIGH (ref 5–15)
BUN: 154 mg/dL — ABNORMAL HIGH (ref 6–20)
CO2: 17 mmol/L — ABNORMAL LOW (ref 22–32)
Calcium: 7.7 mg/dL — ABNORMAL LOW (ref 8.9–10.3)
Chloride: 91 mmol/L — ABNORMAL LOW (ref 98–111)
Creatinine, Ser: 8.55 mg/dL — ABNORMAL HIGH (ref 0.61–1.24)
GFR, Estimated: 7 mL/min — ABNORMAL LOW (ref >60)
Glucose, Bld: 90 mg/dL (ref 70–99)
Phosphorus: 6.4 mg/dL — ABNORMAL HIGH (ref 2.5–4.6)
Potassium: 5.3 mmol/L — ABNORMAL HIGH (ref 3.5–5.1)
Sodium: 129 mmol/L — ABNORMAL LOW (ref 135–145)

## 2021-11-08 LAB — H. PYLORI ANTIBODY, IGG: H Pylori IgG: 0.33 Index Value (ref 0.00–0.79)

## 2021-11-08 LAB — PROCALCITONIN: Procalcitonin: 0.52 ng/mL

## 2021-11-08 MED ORDER — SEVELAMER CARBONATE 800 MG PO TABS
1600.0000 mg | ORAL_TABLET | Freq: Three times a day (TID) | ORAL | Status: DC
  Administered 2021-11-08 – 2021-11-09 (×2): 1600 mg via ORAL
  Filled 2021-11-08: qty 2

## 2021-11-08 MED ORDER — CARVEDILOL 6.25 MG PO TABS
6.2500 mg | ORAL_TABLET | Freq: Two times a day (BID) | ORAL | Status: DC
  Administered 2021-11-08 – 2021-11-09 (×2): 6.25 mg via ORAL
  Filled 2021-11-08 (×2): qty 1

## 2021-11-08 NOTE — Progress Notes (Addendum)
Pt receives out-pt HD at [facility] on TTS. Pt arrives 5:40 for 6:00 chair time. Will assist as needed.   [name] Renal Navigator [phone]

## 2021-11-08 NOTE — Progress Notes (Signed)
 [facility] Progress Note  Subjective: pt seen on HD, no c/o's  Vitals:   11/08/21 0930 11/08/21 1000 11/08/21 1030 11/08/21 1046  BP: (!) 143/88 119/89 (!) 115/98 129/79  Pulse: (!) 102 (!) 101 (!) 104 (!) 105  Resp:    (!) 21  Temp:    97.8 F (36.6 C)  TempSrc:    Temporal  SpO2:    98%  Weight:    71.5 kg  Height:        Exam:  alert, nad , cough off and on  no jvd  Chest cta bilat to bases  Cor reg no RG  Abd soft ntnd no ascites   Ext no LE edema   Alert, NF, ox3  LUA AVF+bruit      Home meds include - coreg, hydralazine, oxy IR prn, renvela 2 ac tid        [facility]  3h 35min  65kg   450/ 1.5  2/2 bath  LUA AVF   Hep none  - hect 1 ug tiw  - mircera 150 ug q2 wks, last 10/18/21, due 1/17  - renvela 2 ac tid   Assessment/ Plan: UGIB / bleeding duodenal ulcer - sp hemospray then GDA embolization 1/21. On PPI IV. Hb ~ 8. Per GI/ pmd.  Anemia ckd - Hb 7.5- 8, due for esa, ordered darbe 100 ug sq weekly while here ESRD - on HD TTS. Missed HD on 1/21. Did not get HD yest d/t high census/ staffing issue. On HD this am. F/u labs in am. Donah Driver from upper GIB.  BP/volume -  hypotensive on admit, BP's normal today. Holding home coreg/ hydralazine. No vol overload on exam. Wts up, follow MBD ckd - cont vdra. Ca in range, phos high at 8. Ordered to resume sevelamer 2 ac.    Rob Doctor, hospital 11/08/2021, 1:10 PM   Recent Labs  Lab 11/07/21 0859 11/08/21 0421  K 4.6 5.3*  BUN 151* 154*  CREATININE 8.25* 8.55*  CALCIUM 7.8* 7.7*  PHOS 5.3* 6.4*  HGB 7.9* 8.1*    Inpatient medications:  Chlorhexidine Gluconate Cloth  6 each Topical Daily   Chlorhexidine Gluconate Cloth  6 each Topical Q0600   darbepoetin (ARANESP) injection - NON-DIALYSIS  100 mcg Subcutaneous Q Mon-1800   docusate sodium  100 mg Oral BID   doxercalciferol  1 mcg Intravenous Q T,Th,Sa-HD   lidocaine  1 patch Transdermal Q24H   mouth rinse  15 mL Mouth Rinse BID   pantoprazole   40 mg Oral BID   polyethylene glycol  17 g Oral Daily    acetaminophen **OR** acetaminophen, diphenhydrAMINE, hydrOXYzine, ondansetron **OR** ondansetron (ZOFRAN) IV, oxyCODONE

## 2021-11-08 NOTE — Progress Notes (Addendum)
PROGRESS NOTE  Timothy [name] BPZ:[id number] DOB: 10-13-1961 DOA: 11/04/2021 PCP: [name], MD  HPI/Recap of past 71 hours: 61 year old with history of ESRD, HTN, HFrEF, colon cancer, polysubstance abuse admitted with syncope episode with hematemesis, large bloody Bms, in the ED upon admission.  Pt received 2 units of PRBC and taken emergently for EGD by Dr. Benson Norway on 1/21. Pt was intubated prior emergent EGD. EGD findings are large 2 cm duodenal actively bleeding ulcer with large visible bleeding vessel uncontrolled by endoscopic interventions. IR was consulted and pt subsequently had GDA microcoil embolization which was successful on 1/21.  Patient admitted in the ICU for further stabilization.  [facility] assumed care on 11/07/2021.      Today, pt denies any new complaints. Reported he had a BM with no blood in it. Denies any chest pain, SOB, abdominal pain, fever/chills.   Assessment/Plan: Principal Problem:   Upper GI bleed Active Problems:   ESRD on hemodialysis (HCC)   Acute blood loss anemia   Pericardial effusion without cardiac tamponade   Chronic combined systolic and diastolic CHF (congestive heart failure) (HCC)   Anemia in ESRD (end-stage renal disease) (HCC)   GI bleed   Duodenal ulcer with hemorrhage    Upper GI bleed 2/2 large duodenal ulcer S/p EGD and GDA embolization Emergently intubated prior to procedure, extubated on 1/22, currently on room air Hemoglobin currently stable, no further bleeding noted S/p 2 units of PRBC Continue PPI twice daily Daily CBC, more frequently if any signs of bleeding occurs Transfuse PRBC if hemoglobin less than 7 If rebleeding occurs, patient will need surgery consult Will need repeat EGD as an outpatient  Acute blood loss anemia from GI bleed Anemia of ESRD Baseline hemoglobin around 10 Management as above  Leukocytosis Noted leukocytosis in the past, ongoing Unknown etiology, afebrile Possibly reactive,  no obvious signs of infection Procalcitonin 0.52 (ESRD patient) Repeat CXR on 11/08/21, with no infiltrate noted, resolving pulm vas congestion Follow CBC closely  ESRD Nephrology consulted, TTS schedule Had HD on 11/08/2021  Hypertension BP stable Restart home Coreg, hold hydralazine for now  Chronic systolic HF Appears euvolemic Echo 09/2021 showed EF of 40 to 45%, global hypokinesis, small pericardial effusion Fluid management per HD    Estimated body mass index is 20.8 kg/m as calculated from the following:   Height as of this encounter: 6\' 1"  (1.854 m).   Weight as of this encounter: 71.5 kg.     Code Status: Full  Family Communication: None at bedside  Disposition Plan: Status is: Inpatient  Remains inpatient appropriate because: Level of care     Consultants: GI IR PCCM Nephrology   Procedures: Mechanical ventilation EGD GDA embolization  Antimicrobials: None  DVT prophylaxis: SCDs   Objective: Vitals:   11/08/21 1000 11/08/21 1030 11/08/21 1046 11/08/21 1500  BP: 119/89 (!) 115/98 129/79 (!) 124/92  Pulse: (!) 101 (!) 104 (!) 105 (!) 106  Resp:   (!) 21 17  Temp:   97.8 F (36.6 C) 98 F (36.7 C)  TempSrc:   Temporal Oral  SpO2:   98% 98%  Weight:   71.5 kg   Height:        Intake/Output Summary (Last 24 hours) at 11/08/2021 1549 Last data filed at 11/08/2021 1541 Gross per 24 hour  Intake 960 ml  Output 2400 ml  Net -1440 ml   Filed Weights   11/05/21 0930 11/08/21 0735 11/08/21 1046  Weight: 69.9 kg 73 kg 71.5 kg  Exam: General: NAD, chronically ill-appearing, restless Cardiovascular: S1, S2 present Respiratory: CTAB Abdomen: Soft, nontender, nondistended, bowel sounds present Musculoskeletal: No bilateral pedal edema noted Skin: Normal, noted old scabs Psychiatry: Normal mood, although a bit agitated    Data Reviewed: CBC: Recent Labs  Lab 11/04/21 2350 11/04/21 2358 11/06/21 0041 11/06/21 0359 11/06/21 1200  11/07/21 0859 11/08/21 0421  WBC 11.1*  --   --  13.4*  --  14.8* 14.8*  NEUTROABS 8.0*  --   --   --   --  11.0* 10.8*  HGB 7.8*   < > 7.4* 8.0* 7.7* 7.9* 8.1*  HCT 24.0*   < > 21.9* 22.9* 23.2* 23.4* 23.9*  MCV 83.6  --   --  82.7  --  83.0 83.0  PLT 310  --   --  206  --  236 235   < > = values in this interval not displayed.   Basic Metabolic Panel: Recent Labs  Lab 11/04/21 2350 11/04/21 2358 11/05/21 7342 11/05/21 0932 11/05/21 1043 11/06/21 0355 11/06/21 0359 11/07/21 0859 11/08/21 0421  NA 134*   < > 133* 133* 134*  --  132* 132* 129*  K 4.2   < > 5.0 4.5 4.6  --  4.8 4.6 5.3*  CL 92*   < > 95* 97*  --   --  94* 94* 91*  CO2 28  --   --  25  --   --  22 19* 17*  GLUCOSE 89   < > 118* 118*  --   --  88 124* 90  BUN 57*   < > 82* 79*  --   --  111* 151* 154*  CREATININE 6.62*   < > 5.90* 6.11*  --   --  6.92* 8.25* 8.55*  CALCIUM 7.6*  --   --  6.8*  --   --  7.6* 7.8* 7.7*  MG  --   --   --   --   --  2.6*  --   --   --   PHOS  --   --   --   --   --   --   --  5.3* 6.4*   < > = values in this interval not displayed.   GFR: Estimated Creatinine Clearance: 9.3 mL/min (A) (by C-G formula based on SCr of 8.55 mg/dL (H)). Liver Function Tests: Recent Labs  Lab 11/04/21 2350 11/07/21 0859 11/07/21 1207 11/08/21 0421  AST 104*  --  48*  --   ALT 116*  --  66*  --   ALKPHOS 75  --  78  --   BILITOT 0.9  --  1.1  --   PROT 6.4*  --  6.5  --   ALBUMIN 2.3* 2.4* 2.4* 2.5*   No results for input(s): LIPASE, AMYLASE in the last 168 hours. No results for input(s): AMMONIA in the last 168 hours. Coagulation Profile: Recent Labs  Lab 11/04/21 2350 11/07/21 1207  INR 1.5* 1.5*   Cardiac Enzymes: No results for input(s): CKTOTAL, CKMB, CKMBINDEX, TROPONINI in the last 168 hours. BNP (last 3 results) No results for input(s): PROBNP in the last 8760 hours. HbA1C: No results for input(s): HGBA1C in the last 72 hours. CBG: Recent Labs  Lab 11/06/21 0354  11/06/21 0745 11/06/21 1159 11/06/21 1515 11/06/21 1923  GLUCAP 81 81 88 125* 130*   Lipid Profile: Recent Labs    11/06/21 0359  TRIG 196*   Thyroid  Function Tests: No results for input(s): TSH, T4TOTAL, FREET4, T3FREE, THYROIDAB in the last 72 hours. Anemia Panel: No results for input(s): VITAMINB12, FOLATE, FERRITIN, TIBC, IRON, RETICCTPCT in the last 72 hours. Urine analysis:    Component Value Date/Time   COLORURINE YELLOW 05/19/2021 2015   APPEARANCEUR HAZY (A) 05/19/2021 2015   LABSPEC 1.012 05/19/2021 2015   PHURINE 9.0 (H) 05/19/2021 2015   GLUCOSEU NEGATIVE 05/19/2021 2015   HGBUR NEGATIVE 05/19/2021 2015   [place] NEGATIVE 05/19/2021 2015   KETONESUR NEGATIVE 05/19/2021 2015   PROTEINUR >=300 (A) 05/19/2021 2015   NITRITE NEGATIVE 05/19/2021 2015   LEUKOCYTESUR SMALL (A) 05/19/2021 2015   Sepsis Labs: @LABRCNTIP (procalcitonin:4,lacticidven:4)  ) Recent Results (from the past 240 hour(s))  Resp Panel by RT-PCR (Flu A&B, Covid) Nasopharyngeal Swab     Status: None   Collection Time: 10/31/21  7:02 PM   Specimen: Nasopharyngeal Swab; Nasopharyngeal(NP) swabs in vial transport medium  Result Value Ref Range Status   SARS Coronavirus 2 by RT PCR NEGATIVE NEGATIVE Final    Comment: (NOTE) SARS-CoV-2 target nucleic acids are NOT DETECTED.  The SARS-CoV-2 RNA is generally detectable in upper respiratory specimens during the acute phase of infection. The lowest concentration of SARS-CoV-2 viral copies this assay can detect is 138 copies/mL. A negative result does not preclude SARS-Cov-2 infection and should not be used as the sole basis for treatment or other patient management decisions. A negative result may occur with  improper specimen collection/handling, submission of specimen other than nasopharyngeal swab, presence of viral mutation(s) within the areas targeted by this assay, and inadequate number of viral copies(<138 copies/mL). A negative result  must be combined with clinical observations, patient history, and epidemiological information. The expected result is Negative.  Fact Sheet for Patients:  EntrepreneurPulse.com.au  Fact Sheet for Healthcare Providers:  IncredibleEmployment.be  This test is no t yet approved or cleared by the [place] FDA and  has been authorized for detection and/or diagnosis of SARS-CoV-2 by FDA under an Emergency Use Authorization (EUA). This EUA will remain  in effect (meaning this test can be used) for the duration of the COVID-19 declaration under Section 564(b)(1) of the Act, 21 U.S.C.section 360bbb-3(b)(1), unless the authorization is terminated  or revoked sooner.       Influenza A by PCR NEGATIVE NEGATIVE Final   Influenza B by PCR NEGATIVE NEGATIVE Final    Comment: (NOTE) The Xpert Xpress SARS-CoV-2/FLU/RSV plus assay is intended as an aid in the diagnosis of influenza from Nasopharyngeal swab specimens and should not be used as a sole basis for treatment. Nasal washings and aspirates are unacceptable for Xpert Xpress SARS-CoV-2/FLU/RSV testing.  Fact Sheet for Patients: EntrepreneurPulse.com.au  Fact Sheet for Healthcare Providers: IncredibleEmployment.be  This test is not yet approved or cleared by the [place] FDA and has been authorized for detection and/or diagnosis of SARS-CoV-2 by FDA under an Emergency Use Authorization (EUA). This EUA will remain in effect (meaning this test can be used) for the duration of the COVID-19 declaration under Section 564(b)(1) of the Act, 21 U.S.C. section 360bbb-3(b)(1), unless the authorization is terminated or revoked.  Performed at [facility], [place] [address] [place], [place] [address]   Resp Panel by RT-PCR (Flu A&B, Covid) Nasopharyngeal Swab     Status: None   Collection Time: 11/04/21 11:51 PM   Specimen: Nasopharyngeal Swab;  Nasopharyngeal(NP) swabs in vial transport medium  Result Value Ref Range Status   SARS Coronavirus 2 by RT PCR  NEGATIVE NEGATIVE Final    Comment: (NOTE) SARS-CoV-2 target nucleic acids are NOT DETECTED.  The SARS-CoV-2 RNA is generally detectable in upper respiratory specimens during the acute phase of infection. The lowest concentration of SARS-CoV-2 viral copies this assay can detect is 138 copies/mL. A negative result does not preclude SARS-Cov-2 infection and should not be used as the sole basis for treatment or other patient management decisions. A negative result may occur with  improper specimen collection/handling, submission of specimen other than nasopharyngeal swab, presence of viral mutation(s) within the areas targeted by this assay, and inadequate number of viral copies(<138 copies/mL). A negative result must be combined with clinical observations, patient history, and epidemiological information. The expected result is Negative.  Fact Sheet for Patients:  EntrepreneurPulse.com.au  Fact Sheet for Healthcare Providers:  IncredibleEmployment.be  This test is no t yet approved or cleared by the [place] FDA and  has been authorized for detection and/or diagnosis of SARS-CoV-2 by FDA under an Emergency Use Authorization (EUA). This EUA will remain  in effect (meaning this test can be used) for the duration of the COVID-19 declaration under Section 564(b)(1) of the Act, 21 U.S.C.section 360bbb-3(b)(1), unless the authorization is terminated  or revoked sooner.       Influenza A by PCR NEGATIVE NEGATIVE Final   Influenza B by PCR NEGATIVE NEGATIVE Final    Comment: (NOTE) The Xpert Xpress SARS-CoV-2/FLU/RSV plus assay is intended as an aid in the diagnosis of influenza from Nasopharyngeal swab specimens and should not be used as a sole basis for treatment. Nasal washings and aspirates are unacceptable for Xpert Xpress  SARS-CoV-2/FLU/RSV testing.  Fact Sheet for Patients: EntrepreneurPulse.com.au  Fact Sheet for Healthcare Providers: IncredibleEmployment.be  This test is not yet approved or cleared by the [place] FDA and has been authorized for detection and/or diagnosis of SARS-CoV-2 by FDA under an Emergency Use Authorization (EUA). This EUA will remain in effect (meaning this test can be used) for the duration of the COVID-19 declaration under Section 564(b)(1) of the Act, 21 U.S.C. section 360bbb-3(b)(1), unless the authorization is terminated or revoked.  Performed at [facility], [place] [address] [place], [place] [address]   MRSA Next Gen by PCR, Nasal     Status: None   Collection Time: 11/05/21  9:38 AM   Specimen: Nasal Mucosa; Nasal Swab  Result Value Ref Range Status   MRSA by PCR Next Gen NOT DETECTED NOT DETECTED Final    Comment: (NOTE) The GeneXpert MRSA Assay (FDA approved for NASAL specimens only), is one component of a comprehensive MRSA colonization surveillance program. It is not intended to diagnose MRSA infection nor to guide or monitor treatment for MRSA infections. Test performance is not FDA approved in patients less than 10 years old. Performed at [facility], [place] [address] [place], [place] [address]       Studies: DG Chest Port 1 View  Result Date: 11/08/2021 CLINICAL DATA:  Leukocytosis EXAM: PORTABLE CHEST 1 VIEW COMPARISON:  Previous studies including the examination of 11/05/2021 FINDINGS: Transverse diameter of heart is increased. There are linear densities in both lower lung fields suggesting scarring or subsegmental atelectasis. Central pulmonary vessels are less prominent. There is improvement in aeration of left parahilar region. There is no significant pleural effusion or pneumothorax. IMPRESSION: Cardiomegaly. There is interval decrease in pulmonary vascular congestion and improvement in aeration of  parahilar regions. There residual linear densities in both lower lung fields suggesting scarring or subsegmental  atelectasis. Electronically Signed   By: Elmer Picker M.D.   On: 11/08/2021 13:37    Scheduled Meds:  Chlorhexidine Gluconate Cloth  6 each Topical Daily   Chlorhexidine Gluconate Cloth  6 each Topical Q0600   darbepoetin (ARANESP) injection - NON-DIALYSIS  100 mcg Subcutaneous Q Mon-1800   docusate sodium  100 mg Oral BID   doxercalciferol  1 mcg Intravenous Q T,Th,Sa-HD   lidocaine  1 patch Transdermal Q24H   mouth rinse  15 mL Mouth Rinse BID   pantoprazole  40 mg Oral BID   polyethylene glycol  17 g Oral Daily   sevelamer carbonate  1,600 mg Oral TID WC    Continuous Infusions:   LOS: 3 days     Alma Friendly, MD [facility]  If 7PM-7AM, please contact night-coverage [email or web address] 11/08/2021, 3:49 PM

## 2021-11-08 NOTE — Progress Notes (Signed)
HD informed consent signed and in chart. Patient has no further questions.

## 2021-11-09 ENCOUNTER — Other Ambulatory Visit (HOSPITAL_COMMUNITY): Payer: Self-pay

## 2021-11-09 LAB — CBC WITH DIFFERENTIAL/PLATELET
Abs Immature Granulocytes: 0.2 10*3/uL — ABNORMAL HIGH (ref 0.00–0.07)
Basophils Absolute: 0 10*3/uL (ref 0.0–0.1)
Basophils Relative: 0 %
Eosinophils Absolute: 0.1 10*3/uL (ref 0.0–0.5)
Eosinophils Relative: 0 %
HCT: 23.4 % — ABNORMAL LOW (ref 39.0–52.0)
Hemoglobin: 7.9 g/dL — ABNORMAL LOW (ref 13.0–17.0)
Immature Granulocytes: 2 %
Lymphocytes Relative: 8 %
Lymphs Abs: 0.9 10*3/uL (ref 0.7–4.0)
MCH: 28.4 pg (ref 26.0–34.0)
MCHC: 33.8 g/dL (ref 30.0–36.0)
MCV: 84.2 fL (ref 80.0–100.0)
Monocytes Absolute: 2.7 10*3/uL — ABNORMAL HIGH (ref 0.1–1.0)
Monocytes Relative: 23 %
Neutro Abs: 7.8 10*3/uL — ABNORMAL HIGH (ref 1.7–7.7)
Neutrophils Relative %: 67 %
Platelets: 229 10*3/uL (ref 150–400)
RBC: 2.78 MIL/uL — ABNORMAL LOW (ref 4.22–5.81)
RDW: 20.7 % — ABNORMAL HIGH (ref 11.5–15.5)
WBC: 11.7 10*3/uL — ABNORMAL HIGH (ref 4.0–10.5)
nRBC: 0 % (ref 0.0–0.2)

## 2021-11-09 LAB — RENAL FUNCTION PANEL
Albumin: 2.4 g/dL — ABNORMAL LOW (ref 3.5–5.0)
Anion gap: 17 — ABNORMAL HIGH (ref 5–15)
BUN: 89 mg/dL — ABNORMAL HIGH (ref 6–20)
CO2: 21 mmol/L — ABNORMAL LOW (ref 22–32)
Calcium: 8.1 mg/dL — ABNORMAL LOW (ref 8.9–10.3)
Chloride: 90 mmol/L — ABNORMAL LOW (ref 98–111)
Creatinine, Ser: 6.52 mg/dL — ABNORMAL HIGH (ref 0.61–1.24)
GFR, Estimated: 9 mL/min — ABNORMAL LOW (ref >60)
Glucose, Bld: 93 mg/dL (ref 70–99)
Phosphorus: 6.5 mg/dL — ABNORMAL HIGH (ref 2.5–4.6)
Potassium: 4.6 mmol/L (ref 3.5–5.1)
Sodium: 128 mmol/L — ABNORMAL LOW (ref 135–145)

## 2021-11-09 MED ORDER — PANTOPRAZOLE SODIUM 40 MG PO TBEC
40.0000 mg | DELAYED_RELEASE_TABLET | Freq: Two times a day (BID) | ORAL | 2 refills | Status: DC
  Filled 2021-11-09: qty 60, 30d supply, fill #0

## 2021-11-09 NOTE — Care Management Important Message (Signed)
Important Message  Patient Details  Name: Timothy [name] MRN: [id number] Date of Birth: 02/10/1961   Medicare Important Message Given:  Yes     Hannah Beat 11/09/2021, 10:48 AM

## 2021-11-09 NOTE — Progress Notes (Signed)
D/C order noted. Contacted [facility] to make clinic aware pt to d/c today and will resume care tomorrow.   [name] Renal Navigator [phone]

## 2021-11-09 NOTE — Progress Notes (Signed)
Pt with discharge orders, discharge paperwork reviewed with patient and all questions answered. Medications delivered from [facility] prior to discharge. Pt taken down via wheelchair with all belongings.

## 2021-11-09 NOTE — Discharge Summary (Signed)
Physician Discharge Summary  Timothy [name] MAU:[id number] DOB: [birth year] DOA: 11/04/2021  PCP: [name], MD  Admit date: 11/04/2021 Discharge date: 11/09/2021  Admitted From: home Discharge disposition: home   Recommendations for Outpatient Follow-Up:   Resume HD CBC 1 week Will need repeat EGD as an outpatient  If rebleeding occurs, patient will need surgery consult   Discharge Diagnosis:   Principal Problem:   Upper GI bleed Active Problems:   ESRD on hemodialysis ([place])   Acute blood loss anemia   Pericardial effusion without cardiac tamponade   Chronic combined systolic and diastolic CHF (congestive heart failure) (HCC)   Anemia in ESRD (end-stage renal disease) ([place])   GI bleed   Duodenal ulcer with hemorrhage    Discharge Condition: Improved.  Diet recommendation: renal diet  Wound care: None.  Code status: Full.   History of Present Illness:   61 year old with history of ESRD, HTN, HFrEF, colon cancer, polysubstance abuse admitted with syncope episode with hematemesis, large bloody Bms, in the ED upon admission.  Pt received 2 units of PRBC and taken emergently for EGD by Dr. Benson Norway on 1/21. Pt was intubated prior emergent EGD. EGD findings are large 2 cm duodenal actively bleeding ulcer with large visible bleeding vessel uncontrolled by endoscopic interventions. IR was consulted and pt subsequently had GDA microcoil embolization which was successful on 1/21.  Patient admitted in the ICU for further stabilization.  [facility] hospitalist assumed care on 11/07/2021.         Hospital Course by Problem:   Upper GI bleed 2/2 large duodenal ulcer S/p EGD and GDA embolization Emergently intubated prior to procedure, extubated on 1/22, currently on room air Hemoglobin currently stable, no further bleeding noted S/p 2 units of PRBC Continue PPI twice daily If rebleeding occurs, patient will need surgery consult Will need repeat EGD as an  outpatient   Acute blood loss anemia from GI bleed Anemia of ESRD Baseline hemoglobin around 10 Management as above   Leukocytosis improved   ESRD Nephrology consulted, TTS schedule Had HD on 11/08/2021   Hypertension BP stable Restart home Coreg   Chronic systolic HF Appears euvolemic Echo 09/2021 showed EF of 40 to 45%, global hypokinesis, small pericardial effusion Fluid management per HD      Medical Consultants:   [facility]al GI PCCM   Discharge Exam:   Vitals:   11/09/21 0303 11/09/21 0754  BP: 131/84 121/74  Pulse: (!) 101   Resp: 14 16  Temp: 98.6 F (37 C) 98.2 F (36.8 C)  SpO2: 100% 91%   Vitals:   11/08/21 2005 11/08/21 2355 11/09/21 0303 11/09/21 0754  BP: 130/85 129/88 131/84 121/74  Pulse: (!) 108 (!) 106 (!) 101   Resp: 17 16 14 16   Temp: 98.3 F (36.8 C) 98.6 F (37 C) 98.6 F (37 C) 98.2 F (36.8 C)  TempSrc: Oral Oral Oral Oral  SpO2: 93% 95% 100% 91%  Weight:      Height:        General exam: Appears calm and comfortable. Asking to go home  The results of significant diagnostics from this hospitalization (including imaging, microbiology, ancillary and laboratory) are listed below for reference.     Procedures and Diagnostic Studies:   CT ABDOMEN PELVIS WO CONTRAST  Result Date: 11/05/2021 CLINICAL DATA:  Status post trauma. EXAM: CT ABDOMEN AND PELVIS WITHOUT CONTRAST TECHNIQUE: Multidetector CT imaging of the abdomen and pelvis was performed following the standard protocol without IV contrast.  RADIATION DOSE REDUCTION: This exam was performed according to the departmental dose-optimization program which includes automated exposure control, adjustment of the mA and/or kV according to patient size and/or use of iterative reconstruction technique. COMPARISON:  June 25, 2021 FINDINGS: Lower chest: There is mild cardiomegaly with a large (approximately 1.5 cm) pericardial effusion. This represents a new finding. Diffusely increased  attenuation of the myocardium is noted. Mild to moderate severity areas of atelectasis are seen within the bilateral lower lobes. Hepatobiliary: Diffusely increased attenuation of the liver parenchyma is noted no focal liver abnormality is seen. Numerous gallstones are seen within a partially contracted gallbladder, without evidence of gallbladder wall thickening or biliary dilatation. Pancreas: There is mild pancreatic ductal dilatation (approximately 2 mm), without evidence of surrounding inflammation. Spleen: Normal in size without focal abnormality. Adrenals/Urinary Tract: Adrenal glands are unremarkable. Innumerable bilateral renal cysts are seen without obstructing renal calculi or hydronephrosis. The urinary bladder is empty and subsequently limited in evaluation. Stomach/Bowel: Mild to moderate severity diffuse thickening of the distal esophageal wall is seen. A large amount of ingested material is seen throughout the body of the stomach. Surgically anastomosed bowel is seen within the right anterior aspect of the right upper quadrant and medial aspect of the lower right abdomen, consistent with prior right hemicolectomy. No evidence of bowel dilatation. Vascular/Lymphatic: Aortic atherosclerosis. No enlarged abdominal or pelvic lymph nodes. Reproductive: Prostate is unremarkable. Other: No abdominal wall hernia or abnormality. No abdominopelvic ascites. Musculoskeletal: No acute or significant osseous findings. IMPRESSION: 1. Mild cardiomegaly with a large (approximately 1.5 cm) pericardial effusion. This represents a new finding. 2. Diffusely increased attenuation of the myocardium and liver, consistent with hemochromatosis. 3. Cholelithiasis. 4. Innumerable bilateral renal cysts. 5. Mild to moderate severity diffuse thickening of the distal esophageal wall, which may represent sequelae associated with esophagitis. 6. Findings consistent with prior right hemicolectomy. 7. Aortic atherosclerosis. Aortic  Atherosclerosis (ICD10-I70.0). Electronically Signed   By: Virgina Norfolk M.D.   On: 11/05/2021 01:23   CT HEAD WO CONTRAST (5MM)  Result Date: 11/05/2021 CLINICAL DATA:  Fall, head and neck trauma. EXAM: CT HEAD WITHOUT CONTRAST CT CERVICAL SPINE WITHOUT CONTRAST TECHNIQUE: Multidetector CT imaging of the head and cervical spine was performed following the standard protocol without intravenous contrast. Multiplanar CT image reconstructions of the cervical spine were also generated. RADIATION DOSE REDUCTION: This exam was performed according to the departmental dose-optimization program which includes automated exposure control, adjustment of the mA and/or kV according to patient size and/or use of iterative reconstruction technique. COMPARISON:  None. FINDINGS: CT HEAD FINDINGS Brain: No acute hemorrhage, midline shift or mass effect. No extra-axial fluid collection. Mild cerebral atrophy is noted. Vascular: No hyperdense vessel or unexpected calcification. Skull: Normal. Negative for fracture or focal lesion. Sinuses/Orbits: No acute finding. Other: None. CT CERVICAL SPINE FINDINGS Alignment: There is mild retrolisthesis at C4-C5. Skull base and vertebrae: No acute fracture. No primary bone lesion or focal pathologic process. Soft tissues and spinal canal: No prevertebral fluid or swelling. No visible canal hematoma. Disc levels: Intervertebral disc space narrowing and degenerative endplate changes are seen predominantly at C4-C5 and C5-C6 resulting in moderate to severe spinal canal and neural foraminal stenosis. Upper chest: Negative. Other: Atherosclerotic calcification of the carotid arteries. IMPRESSION: 1. No acute intracranial process. 2. No acute fracture in the cervical spine. 3. 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 Electronically Signed   By: Brett Fairy M.D.   On: 11/05/2021 01:21   CT Cervical  Spine Wo Contrast  Result Date: 11/05/2021 CLINICAL DATA:  Fall, head and  neck trauma. EXAM: CT HEAD WITHOUT CONTRAST CT CERVICAL SPINE WITHOUT CONTRAST TECHNIQUE: Multidetector CT imaging of the head and cervical spine was performed following the standard protocol without intravenous contrast. Multiplanar CT image reconstructions of the cervical spine were also generated. RADIATION DOSE REDUCTION: This exam was performed according to the departmental dose-optimization program which includes automated exposure control, adjustment of the mA and/or kV according to patient size and/or use of iterative reconstruction technique. COMPARISON:  None. FINDINGS: CT HEAD FINDINGS Brain: No acute hemorrhage, midline shift or mass effect. No extra-axial fluid collection. Mild cerebral atrophy is noted. Vascular: No hyperdense vessel or unexpected calcification. Skull: Normal. Negative for fracture or focal lesion. Sinuses/Orbits: No acute finding. Other: None. CT CERVICAL SPINE FINDINGS Alignment: There is mild retrolisthesis at C4-C5. Skull base and vertebrae: No acute fracture. No primary bone lesion or focal pathologic process. Soft tissues and spinal canal: No prevertebral fluid or swelling. No visible canal hematoma. Disc levels: Intervertebral disc space narrowing and degenerative endplate changes are seen predominantly at C4-C5 and C5-C6 resulting in moderate to severe spinal canal and neural foraminal stenosis. Upper chest: Negative. Other: Atherosclerotic calcification of the carotid arteries. IMPRESSION: 1. No acute intracranial process. 2. No acute fracture in the cervical spine. 3. 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 Electronically Signed   By: Brett Fairy M.D.   On: 11/05/2021 01:21   IR Angiogram Visceral Selective  Result Date: 11/05/2021 INDICATION: Emergent acute upper GI bleed secondary to endoscopy proven duodenal bulb ulcer. Endoscopic hemostasis obtained. Patient now presents for emergent GDA coil embolization. EXAM: ULTRASOUND GUIDANCE FOR VASCULAR  ACCESS CELIAC, COMMON HEPATIC, AND GDA ARTERIAL MICRO CATHETERIZATION AND ANGIOGRAMS SUCCESSFUL GDA MICRO COIL EMBOLIZATION MEDICATIONS: NONE. ANESTHESIA/SEDATION: GENERAL ANESTHESIA CONTRAST:  45 cc omni 300 FLUOROSCOPY TIME:  Fluoroscopy Time: 11 minutes 42 seconds (489 mGy). COMPLICATIONS: None immediate. PROCEDURE: Informed consent was obtained from the patient following explanation of the procedure, risks, benefits and alternatives. The patient understands, agrees and consents for the procedure. All questions were addressed. A time out was performed prior to the initiation of the procedure. Maximal barrier sterile technique utilized including caps, mask, sterile gowns, sterile gloves, large sterile drape, hand hygiene, and Betadine prep. Under sterile conditions, ultrasound micropuncture access performed the right common femoral artery. Images obtained for documentation. 018 guidewire inserted followed by the micro dilator. Five French sheath inserted over a Bentson guidewire. Initially C2 catheter was advanced to select the celiac origin. Catheter position was unstable. Catheter exchanged for a Mickelson catheter. Mickelson catheter utilized to re select the celiac origin. Selective celiac angiogram performed. Celiac: Mild atherosclerotic change of the celiac origin. Celiac axis remains patent. Celiac branches including the splenic, common hepatic, GDA, proper hepatic, and left and right hepatic arteries are all patent. Left gastric artery is also visualized and patent. No active bleeding demonstrated by angiography. Through the [facility] catheter, a Renegade STC catheter was advanced initially into the common hepatic artery. Common hepatic angiogram performed. Common hepatic: The common, proper, right and left hepatic arteries are visualized and patent. Catheter advanced over a double angled GT Glidewire into the GDA. GDA angiogram performed. GDA: GDA is patent. There is some irregular focal outpouching of  the distal GDA noted but without active contrast extravasation. This can be seen with duodenal ulcer disease. Gastroepiploic vasculature is patent. There is vaso constriction throughout the pancreaticoduodenal arcade. GDA micro coil embolization: For coil  embolization, 3, 4, and 5 mm IDC 2D interlock coils were deployed. A total of 5 coils were successfully deployed. Post embolization angiogram confirms near complete stasis within the GDA vascular territory. Again, no active arterial GI bleeding demonstrated by angiography. Catheter access removed. Hemostasis obtained with manual compression due to the degree of calcific atherosclerosis in the femoral artery. Patient tolerated procedure well. No immediate complication. IMPRESSION: Successful preventative GDA micro coil embolization emergently for an endoscopically proven duodenal bulb bleeding ulcer. Electronically Signed   By: Jerilynn Mages.  Shick M.D.   On: 11/05/2021 09:36   IR US Guide Vasc Access Right  Result Date: 11/05/2021 INDICATION: Emergent acute upper GI bleed secondary to endoscopy proven duodenal bulb ulcer. Endoscopic hemostasis obtained. Patient now presents for emergent GDA coil embolization. EXAM: ULTRASOUND GUIDANCE FOR VASCULAR ACCESS CELIAC, COMMON HEPATIC, AND GDA ARTERIAL MICRO CATHETERIZATION AND ANGIOGRAMS SUCCESSFUL GDA MICRO COIL EMBOLIZATION MEDICATIONS: NONE. ANESTHESIA/SEDATION: GENERAL ANESTHESIA CONTRAST:  45 cc omni 300 FLUOROSCOPY TIME:  Fluoroscopy Time: 11 minutes 42 seconds (489 mGy). COMPLICATIONS: None immediate. PROCEDURE: Informed consent was obtained from the patient following explanation of the procedure, risks, benefits and alternatives. The patient understands, agrees and consents for the procedure. All questions were addressed. A time out was performed prior to the initiation of the procedure. Maximal barrier sterile technique utilized including caps, mask, sterile gowns, sterile gloves, large sterile drape, hand hygiene, and  Betadine prep. Under sterile conditions, ultrasound micropuncture access performed the right common femoral artery. Images obtained for documentation. 018 guidewire inserted followed by the micro dilator. Five French sheath inserted over a Bentson guidewire. Initially C2 catheter was advanced to select the celiac origin. Catheter position was unstable. Catheter exchanged for a Mickelson catheter. Mickelson catheter utilized to re select the celiac origin. Selective celiac angiogram performed. Celiac: Mild atherosclerotic change of the celiac origin. Celiac axis remains patent. Celiac branches including the splenic, common hepatic, GDA, proper hepatic, and left and right hepatic arteries are all patent. Left gastric artery is also visualized and patent. No active bleeding demonstrated by angiography. Through the [facility] catheter, a Renegade STC catheter was advanced initially into the common hepatic artery. Common hepatic angiogram performed. Common hepatic: The common, proper, right and left hepatic arteries are visualized and patent. Catheter advanced over a double angled GT Glidewire into the GDA. GDA angiogram performed. GDA: GDA is patent. There is some irregular focal outpouching of the distal GDA noted but without active contrast extravasation. This can be seen with duodenal ulcer disease. Gastroepiploic vasculature is patent. There is vaso constriction throughout the pancreaticoduodenal arcade. GDA micro coil embolization: For coil embolization, 3, 4, and 5 mm IDC 2D interlock coils were deployed. A total of 5 coils were successfully deployed. Post embolization angiogram confirms near complete stasis within the GDA vascular territory. Again, no active arterial GI bleeding demonstrated by angiography. Catheter access removed. Hemostasis obtained with manual compression due to the degree of calcific atherosclerosis in the femoral artery. Patient tolerated procedure well. No immediate complication. IMPRESSION:  Successful preventative GDA micro coil embolization emergently for an endoscopically proven duodenal bulb bleeding ulcer. Electronically Signed   By: Jerilynn Mages.  Shick M.D.   On: 11/05/2021 09:36   DG Chest Port 1 View  Result Date: 11/05/2021 CLINICAL DATA:  Weakness, vomiting blood. EXAM: PORTABLE CHEST 1 VIEW COMPARISON:  None. FINDINGS: The heart is enlarged the mediastinal contours within normal limits. Atherosclerotic calcification of the aorta is noted. The pulmonary vasculature is distended. Interstitial prominence is noted bilaterally. Subsegmental atelectasis is  noted at the right lung base. No effusion or pneumothorax. No acute osseous abnormality. IMPRESSION: 1. Cardiomegaly with pulmonary vascular congestion. 2. Interstitial prominence bilaterally, possible edema or infiltrate. Electronically Signed   By: Brett Fairy M.D.   On: 11/05/2021 01:22   IR EMBO ART  VEN HEMORR LYMPH EXTRAV  INC GUIDE ROADMAPPING  Result Date: 11/05/2021 INDICATION: Emergent acute upper GI bleed secondary to endoscopy proven duodenal bulb ulcer. Endoscopic hemostasis obtained. Patient now presents for emergent GDA coil embolization. EXAM: ULTRASOUND GUIDANCE FOR VASCULAR ACCESS CELIAC, COMMON HEPATIC, AND GDA ARTERIAL MICRO CATHETERIZATION AND ANGIOGRAMS SUCCESSFUL GDA MICRO COIL EMBOLIZATION MEDICATIONS: NONE. ANESTHESIA/SEDATION: GENERAL ANESTHESIA CONTRAST:  45 cc omni 300 FLUOROSCOPY TIME:  Fluoroscopy Time: 11 minutes 42 seconds (489 mGy). COMPLICATIONS: None immediate. PROCEDURE: Informed consent was obtained from the patient following explanation of the procedure, risks, benefits and alternatives. The patient understands, agrees and consents for the procedure. All questions were addressed. A time out was performed prior to the initiation of the procedure. Maximal barrier sterile technique utilized including caps, mask, sterile gowns, sterile gloves, large sterile drape, hand hygiene, and Betadine prep. Under sterile  conditions, ultrasound micropuncture access performed the right common femoral artery. Images obtained for documentation. 018 guidewire inserted followed by the micro dilator. Five French sheath inserted over a Bentson guidewire. Initially C2 catheter was advanced to select the celiac origin. Catheter position was unstable. Catheter exchanged for a Mickelson catheter. Mickelson catheter utilized to re select the celiac origin. Selective celiac angiogram performed. Celiac: Mild atherosclerotic change of the celiac origin. Celiac axis remains patent. Celiac branches including the splenic, common hepatic, GDA, proper hepatic, and left and right hepatic arteries are all patent. Left gastric artery is also visualized and patent. No active bleeding demonstrated by angiography. Through the [facility] catheter, a Renegade STC catheter was advanced initially into the common hepatic artery. Common hepatic angiogram performed. Common hepatic: The common, proper, right and left hepatic arteries are visualized and patent. Catheter advanced over a double angled GT Glidewire into the GDA. GDA angiogram performed. GDA: GDA is patent. There is some irregular focal outpouching of the distal GDA noted but without active contrast extravasation. This can be seen with duodenal ulcer disease. Gastroepiploic vasculature is patent. There is vaso constriction throughout the pancreaticoduodenal arcade. GDA micro coil embolization: For coil embolization, 3, 4, and 5 mm IDC 2D interlock coils were deployed. A total of 5 coils were successfully deployed. Post embolization angiogram confirms near complete stasis within the GDA vascular territory. Again, no active arterial GI bleeding demonstrated by angiography. Catheter access removed. Hemostasis obtained with manual compression due to the degree of calcific atherosclerosis in the femoral artery. Patient tolerated procedure well. No immediate complication. IMPRESSION: Successful preventative GDA  micro coil embolization emergently for an endoscopically proven duodenal bulb bleeding ulcer. Electronically Signed   By: Jerilynn Mages.  Shick M.D.   On: 11/05/2021 09:36     Labs:   Basic Metabolic Panel: Recent Labs  Lab 11/05/21 0932 11/05/21 1043 11/06/21 0355 11/06/21 0359 11/07/21 0859 11/08/21 0421 11/09/21 0434  NA 133* 134*  --  132* 132* 129* 128*  K 4.5 4.6  --  4.8 4.6 5.3* 4.6  CL 97*  --   --  94* 94* 91* 90*  CO2 25  --   --  22 19* 17* 21*  GLUCOSE 118*  --   --  88 124* 90 93  BUN 79*  --   --  111* 151* 154* 89*  CREATININE  6.11*  --   --  6.92* 8.25* 8.55* 6.52*  CALCIUM 6.8*  --   --  7.6* 7.8* 7.7* 8.1*  MG  --   --  2.6*  --   --   --   --   PHOS  --   --   --   --  5.3* 6.4* 6.5*   GFR Estimated Creatinine Clearance: 12.2 mL/min (A) (by C-G formula based on SCr of 6.52 mg/dL (H)). Liver Function Tests: Recent Labs  Lab 11/04/21 2350 11/07/21 0859 11/07/21 1207 11/08/21 0421 11/09/21 0434  AST 104*  --  48*  --   --   ALT 116*  --  66*  --   --   ALKPHOS 75  --  78  --   --   BILITOT 0.9  --  1.1  --   --   PROT 6.4*  --  6.5  --   --   ALBUMIN 2.3* 2.4* 2.4* 2.5* 2.4*   No results for input(s): LIPASE, AMYLASE in the last 168 hours. No results for input(s): AMMONIA in the last 168 hours. Coagulation profile Recent Labs  Lab 11/04/21 2350 11/07/21 1207  INR 1.5* 1.5*    CBC: Recent Labs  Lab 11/04/21 2350 11/04/21 2358 11/06/21 0359 11/06/21 1200 11/07/21 0859 11/08/21 0421 11/09/21 0434  WBC 11.1*  --  13.4*  --  14.8* 14.8* 11.7*  NEUTROABS 8.0*  --   --   --  11.0* 10.8* 7.8*  HGB 7.8*   < > 8.0* 7.7* 7.9* 8.1* 7.9*  HCT 24.0*   < > 22.9* 23.2* 23.4* 23.9* 23.4*  MCV 83.6  --  82.7  --  83.0 83.0 84.2  PLT 310  --  206  --  236 235 229   < > = values in this interval not displayed.   Cardiac Enzymes: No results for input(s): CKTOTAL, CKMB, CKMBINDEX, TROPONINI in the last 168 hours. BNP: Invalid input(s): POCBNP CBG: Recent  Labs  Lab 11/06/21 0354 11/06/21 0745 11/06/21 1159 11/06/21 1515 11/06/21 1923  GLUCAP 81 81 88 125* 130*   D-Dimer No results for input(s): DDIMER in the last 72 hours. Hgb A1c No results for input(s): HGBA1C in the last 72 hours. Lipid Profile No results for input(s): CHOL, HDL, LDLCALC, TRIG, CHOLHDL, LDLDIRECT in the last 72 hours. Thyroid function studies No results for input(s): TSH, T4TOTAL, T3FREE, THYROIDAB in the last 72 hours.  Invalid input(s): FREET3 Anemia work up No results for input(s): VITAMINB12, FOLATE, FERRITIN, TIBC, IRON, RETICCTPCT in the last 72 hours. Microbiology Recent Results (from the past 240 hour(s))  Resp Panel by RT-PCR (Flu A&B, Covid) Nasopharyngeal Swab     Status: None   Collection Time: 10/31/21  7:02 PM   Specimen: Nasopharyngeal Swab; Nasopharyngeal(NP) swabs in vial transport medium  Result Value Ref Range Status   SARS Coronavirus 2 by RT PCR NEGATIVE NEGATIVE Final    Comment: (NOTE) SARS-CoV-2 target nucleic acids are NOT DETECTED.  The SARS-CoV-2 RNA is generally detectable in upper respiratory specimens during the acute phase of infection. The lowest concentration of SARS-CoV-2 viral copies this assay can detect is 138 copies/mL. A negative result does not preclude SARS-Cov-2 infection and should not be used as the sole basis for treatment or other patient management decisions. A negative result may occur with  improper specimen collection/handling, submission of specimen other than nasopharyngeal swab, presence of viral mutation(s) within the areas targeted by this assay, and inadequate number  of viral copies(<138 copies/mL). A negative result must be combined with clinical observations, patient history, and epidemiological information. The expected result is Negative.  Fact Sheet for Patients:  EntrepreneurPulse.com.au  Fact Sheet for Healthcare Providers:   IncredibleEmployment.be  This test is no t yet approved or cleared by the [place] FDA and  has been authorized for detection and/or diagnosis of SARS-CoV-2 by FDA under an Emergency Use Authorization (EUA). This EUA will remain  in effect (meaning this test can be used) for the duration of the COVID-19 declaration under Section 564(b)(1) of the Act, 21 U.S.C.section 360bbb-3(b)(1), unless the authorization is terminated  or revoked sooner.       Influenza A by PCR NEGATIVE NEGATIVE Final   Influenza B by PCR NEGATIVE NEGATIVE Final    Comment: (NOTE) The Xpert Xpress SARS-CoV-2/FLU/RSV plus assay is intended as an aid in the diagnosis of influenza from Nasopharyngeal swab specimens and should not be used as a sole basis for treatment. Nasal washings and aspirates are unacceptable for Xpert Xpress SARS-CoV-2/FLU/RSV testing.  Fact Sheet for Patients: EntrepreneurPulse.com.au  Fact Sheet for Healthcare Providers: IncredibleEmployment.be  This test is not yet approved or cleared by the [place] FDA and has been authorized for detection and/or diagnosis of SARS-CoV-2 by FDA under an Emergency Use Authorization (EUA). This EUA will remain in effect (meaning this test can be used) for the duration of the COVID-19 declaration under Section 564(b)(1) of the Act, 21 U.S.C. section 360bbb-3(b)(1), unless the authorization is terminated or revoked.  Performed at [facility], [place] [address] [place], [place] [address]   Resp Panel by RT-PCR (Flu A&B, Covid) Nasopharyngeal Swab     Status: None   Collection Time: 11/04/21 11:51 PM   Specimen: Nasopharyngeal Swab; Nasopharyngeal(NP) swabs in vial transport medium  Result Value Ref Range Status   SARS Coronavirus 2 by RT PCR NEGATIVE NEGATIVE Final    Comment: (NOTE) SARS-CoV-2 target nucleic acids are NOT DETECTED.  The SARS-CoV-2 RNA is generally detectable in  upper respiratory specimens during the acute phase of infection. The lowest concentration of SARS-CoV-2 viral copies this assay can detect is 138 copies/mL. A negative result does not preclude SARS-Cov-2 infection and should not be used as the sole basis for treatment or other patient management decisions. A negative result may occur with  improper specimen collection/handling, submission of specimen other than nasopharyngeal swab, presence of viral mutation(s) within the areas targeted by this assay, and inadequate number of viral copies(<138 copies/mL). A negative result must be combined with clinical observations, patient history, and epidemiological information. The expected result is Negative.  Fact Sheet for Patients:  EntrepreneurPulse.com.au  Fact Sheet for Healthcare Providers:  IncredibleEmployment.be  This test is no t yet approved or cleared by the [place] FDA and  has been authorized for detection and/or diagnosis of SARS-CoV-2 by FDA under an Emergency Use Authorization (EUA). This EUA will remain  in effect (meaning this test can be used) for the duration of the COVID-19 declaration under Section 564(b)(1) of the Act, 21 U.S.C.section 360bbb-3(b)(1), unless the authorization is terminated  or revoked sooner.       Influenza A by PCR NEGATIVE NEGATIVE Final   Influenza B by PCR NEGATIVE NEGATIVE Final    Comment: (NOTE) The Xpert Xpress SARS-CoV-2/FLU/RSV plus assay is intended as an aid in the diagnosis of influenza from Nasopharyngeal swab specimens and should not be used as a sole basis for treatment. Nasal washings and aspirates are unacceptable  for Xpert Xpress SARS-CoV-2/FLU/RSV testing.  Fact Sheet for Patients: EntrepreneurPulse.com.au  Fact Sheet for Healthcare Providers: IncredibleEmployment.be  This test is not yet approved or cleared by the [place] FDA and has been  authorized for detection and/or diagnosis of SARS-CoV-2 by FDA under an Emergency Use Authorization (EUA). This EUA will remain in effect (meaning this test can be used) for the duration of the COVID-19 declaration under Section 564(b)(1) of the Act, 21 U.S.C. section 360bbb-3(b)(1), unless the authorization is terminated or revoked.  Performed at [facility], [place] [address], [place], [place] [address]   MRSA Next Gen by PCR, Nasal     Status: None   Collection Time: 11/05/21  9:38 AM   Specimen: Nasal Mucosa; Nasal Swab  Result Value Ref Range Status   MRSA by PCR Next Gen NOT DETECTED NOT DETECTED Final    Comment: (NOTE) The GeneXpert MRSA Assay (FDA approved for NASAL specimens only), is one component of a comprehensive MRSA colonization surveillance program. It is not intended to diagnose MRSA infection nor to guide or monitor treatment for MRSA infections. Test performance is not FDA approved in patients less than 2 years old. Performed at [facility], [place] [address] [place], [place] [address]      Discharge Instructions:   Discharge Instructions     Discharge instructions   Complete by: As directed    repeat EGD in 12 weeks. Continue PPI BID until that time.   Avoid NSAIDs as able.  Renal diet   Increase activity slowly   Complete by: As directed    No wound care   Complete by: As directed       Allergies as of 11/09/2021   No Known Allergies      Medication List     STOP taking these medications    hydrALAZINE 25 MG tablet Commonly known as: APRESOLINE       TAKE these medications    carvedilol 6.25 MG tablet Commonly known as: COREG Take 1 tablet (6.25 mg total) by mouth 2 (two) times daily with a meal.   LIDOCAINE-PRILOCAINE EX Apply 1 application topically See admin instructions. Prior to dialysis Tuesday,Thursday and saturday   Oxycodone HCl 10 MG Tabs Take 10 mg by mouth 2 (two) times daily as needed (pain).    pantoprazole 40 MG tablet Commonly known as: PROTONIX Take 1 tablet (40 mg total) by mouth 2 (two) times daily.   sevelamer carbonate 800 MG tablet Commonly known as: RENVELA Take 1,600 mg by mouth with breakfast, with lunch, and with evening meal.        Follow-up Information     [name], MD Follow up.   Specialty: Internal Medicine Contact information: [place] [address] [phone]         Carol Ada, MD Follow up.   Specialty: Gastroenterology Contact information: [address] [place]  [address] [phone]                  Time coordinating discharge: 35 min  Signed:  Geradine Girt DO  [facility] 11/09/2021, 8:40 AM

## 2021-11-09 NOTE — Progress Notes (Signed)
At this time patient is refusing tele monitoring. Pt is currently up ambulating in room.

## 2021-11-12 ENCOUNTER — Other Ambulatory Visit: Payer: Self-pay

## 2021-11-12 ENCOUNTER — Inpatient Hospital Stay (HOSPITAL_COMMUNITY)
Admission: EM | Admit: 2021-11-12 | Discharge: 2021-12-02 | DRG: 326 | Disposition: A | Discharge status: Home Health | Payer: Medicare (Managed Care) | Attending: Student | Admitting: Student

## 2021-11-12 ENCOUNTER — Encounter (HOSPITAL_COMMUNITY): Payer: Self-pay | Admitting: Internal Medicine

## 2021-11-12 ENCOUNTER — Emergency Department (HOSPITAL_COMMUNITY): Payer: Medicare (Managed Care)

## 2021-11-12 DIAGNOSIS — R109 Unspecified abdominal pain: Secondary | ICD-10-CM | POA: Diagnosis present

## 2021-11-12 DIAGNOSIS — K625 Hemorrhage of anus and rectum: Secondary | ICD-10-CM | POA: Diagnosis present

## 2021-11-12 DIAGNOSIS — T82590A Other mechanical complication of surgically created arteriovenous fistula, initial encounter: Secondary | ICD-10-CM | POA: Diagnosis not present

## 2021-11-12 DIAGNOSIS — E871 Hypo-osmolality and hyponatremia: Secondary | ICD-10-CM | POA: Diagnosis not present

## 2021-11-12 DIAGNOSIS — N400 Enlarged prostate without lower urinary tract symptoms: Secondary | ICD-10-CM | POA: Diagnosis present

## 2021-11-12 DIAGNOSIS — R188 Other ascites: Secondary | ICD-10-CM | POA: Diagnosis present

## 2021-11-12 DIAGNOSIS — L89152 Pressure ulcer of sacral region, stage 2: Secondary | ICD-10-CM | POA: Diagnosis present

## 2021-11-12 DIAGNOSIS — F1110 Opioid abuse, uncomplicated: Secondary | ICD-10-CM | POA: Diagnosis present

## 2021-11-12 DIAGNOSIS — Z20822 Contact with and (suspected) exposure to covid-19: Secondary | ICD-10-CM | POA: Diagnosis present

## 2021-11-12 DIAGNOSIS — M79604 Pain in right leg: Secondary | ICD-10-CM | POA: Insufficient documentation

## 2021-11-12 DIAGNOSIS — D689 Coagulation defect, unspecified: Secondary | ICD-10-CM | POA: Diagnosis present

## 2021-11-12 DIAGNOSIS — G8918 Other acute postprocedural pain: Secondary | ICD-10-CM | POA: Diagnosis not present

## 2021-11-12 DIAGNOSIS — J9601 Acute respiratory failure with hypoxia: Secondary | ICD-10-CM | POA: Diagnosis not present

## 2021-11-12 DIAGNOSIS — I480 Paroxysmal atrial fibrillation: Secondary | ICD-10-CM | POA: Diagnosis present

## 2021-11-12 DIAGNOSIS — R578 Other shock: Secondary | ICD-10-CM | POA: Diagnosis not present

## 2021-11-12 DIAGNOSIS — K449 Diaphragmatic hernia without obstruction or gangrene: Secondary | ICD-10-CM | POA: Diagnosis present

## 2021-11-12 DIAGNOSIS — K219 Gastro-esophageal reflux disease without esophagitis: Secondary | ICD-10-CM | POA: Diagnosis present

## 2021-11-12 DIAGNOSIS — Z8249 Family history of ischemic heart disease and other diseases of the circulatory system: Secondary | ICD-10-CM

## 2021-11-12 DIAGNOSIS — I132 Hypertensive heart and chronic kidney disease with heart failure and with stage 5 chronic kidney disease, or end stage renal disease: Secondary | ICD-10-CM | POA: Diagnosis present

## 2021-11-12 DIAGNOSIS — Z85038 Personal history of other malignant neoplasm of large intestine: Secondary | ICD-10-CM

## 2021-11-12 DIAGNOSIS — K922 Gastrointestinal hemorrhage, unspecified: Secondary | ICD-10-CM | POA: Diagnosis present

## 2021-11-12 DIAGNOSIS — I3139 Other pericardial effusion (noninflammatory): Secondary | ICD-10-CM | POA: Diagnosis not present

## 2021-11-12 DIAGNOSIS — Z992 Dependence on renal dialysis: Secondary | ICD-10-CM

## 2021-11-12 DIAGNOSIS — D500 Iron deficiency anemia secondary to blood loss (chronic): Secondary | ICD-10-CM

## 2021-11-12 DIAGNOSIS — N186 End stage renal disease: Secondary | ICD-10-CM | POA: Diagnosis present

## 2021-11-12 DIAGNOSIS — L8990 Pressure ulcer of unspecified site, unspecified stage: Secondary | ICD-10-CM | POA: Insufficient documentation

## 2021-11-12 DIAGNOSIS — D631 Anemia in chronic kidney disease: Secondary | ICD-10-CM | POA: Diagnosis not present

## 2021-11-12 DIAGNOSIS — Z6820 Body mass index (BMI) 20.0-20.9, adult: Secondary | ICD-10-CM

## 2021-11-12 DIAGNOSIS — M7989 Other specified soft tissue disorders: Secondary | ICD-10-CM

## 2021-11-12 DIAGNOSIS — R2232 Localized swelling, mass and lump, left upper limb: Secondary | ICD-10-CM | POA: Diagnosis not present

## 2021-11-12 DIAGNOSIS — Z9049 Acquired absence of other specified parts of digestive tract: Secondary | ICD-10-CM

## 2021-11-12 DIAGNOSIS — I5042 Chronic combined systolic (congestive) and diastolic (congestive) heart failure: Secondary | ICD-10-CM | POA: Diagnosis present

## 2021-11-12 DIAGNOSIS — F17210 Nicotine dependence, cigarettes, uncomplicated: Secondary | ICD-10-CM | POA: Diagnosis present

## 2021-11-12 DIAGNOSIS — E875 Hyperkalemia: Secondary | ICD-10-CM | POA: Diagnosis not present

## 2021-11-12 DIAGNOSIS — M79605 Pain in left leg: Secondary | ICD-10-CM | POA: Diagnosis not present

## 2021-11-12 DIAGNOSIS — I340 Nonrheumatic mitral (valve) insufficiency: Secondary | ICD-10-CM | POA: Diagnosis present

## 2021-11-12 DIAGNOSIS — D62 Acute posthemorrhagic anemia: Secondary | ICD-10-CM | POA: Diagnosis present

## 2021-11-12 DIAGNOSIS — M898X9 Other specified disorders of bone, unspecified site: Secondary | ICD-10-CM | POA: Diagnosis present

## 2021-11-12 DIAGNOSIS — E43 Unspecified severe protein-calorie malnutrition: Secondary | ICD-10-CM | POA: Diagnosis not present

## 2021-11-12 DIAGNOSIS — K920 Hematemesis: Secondary | ICD-10-CM | POA: Diagnosis not present

## 2021-11-12 DIAGNOSIS — I493 Ventricular premature depolarization: Secondary | ICD-10-CM | POA: Diagnosis present

## 2021-11-12 DIAGNOSIS — I160 Hypertensive urgency: Secondary | ICD-10-CM | POA: Diagnosis not present

## 2021-11-12 DIAGNOSIS — R0902 Hypoxemia: Secondary | ICD-10-CM

## 2021-11-12 DIAGNOSIS — Z79899 Other long term (current) drug therapy: Secondary | ICD-10-CM

## 2021-11-12 DIAGNOSIS — Z0189 Encounter for other specified special examinations: Secondary | ICD-10-CM

## 2021-11-12 DIAGNOSIS — Z841 Family history of disorders of kidney and ureter: Secondary | ICD-10-CM

## 2021-11-12 DIAGNOSIS — G894 Chronic pain syndrome: Secondary | ICD-10-CM | POA: Diagnosis present

## 2021-11-12 DIAGNOSIS — K264 Chronic or unspecified duodenal ulcer with hemorrhage: Secondary | ICD-10-CM | POA: Diagnosis not present

## 2021-11-12 DIAGNOSIS — K921 Melena: Secondary | ICD-10-CM | POA: Diagnosis not present

## 2021-11-12 DIAGNOSIS — D72829 Elevated white blood cell count, unspecified: Secondary | ICD-10-CM | POA: Diagnosis present

## 2021-11-12 DIAGNOSIS — K255 Chronic or unspecified gastric ulcer with perforation: Secondary | ICD-10-CM

## 2021-11-12 LAB — COMPREHENSIVE METABOLIC PANEL
ALT: 27 U/L (ref 0–44)
AST: 22 U/L (ref 15–41)
Albumin: 2 g/dL — ABNORMAL LOW (ref 3.5–5.0)
Alkaline Phosphatase: 55 U/L (ref 38–126)
Anion gap: 13 (ref 5–15)
BUN: 41 mg/dL — ABNORMAL HIGH (ref 6–20)
CO2: 27 mmol/L (ref 22–32)
Calcium: 7.7 mg/dL — ABNORMAL LOW (ref 8.9–10.3)
Chloride: 98 mmol/L (ref 98–111)
Creatinine, Ser: 4.68 mg/dL — ABNORMAL HIGH (ref 0.61–1.24)
GFR, Estimated: 14 mL/min — ABNORMAL LOW (ref >60)
Glucose, Bld: 130 mg/dL — ABNORMAL HIGH (ref 70–99)
Potassium: 4.1 mmol/L (ref 3.5–5.1)
Sodium: 138 mmol/L (ref 135–145)
Total Bilirubin: 0.5 mg/dL (ref 0.3–1.2)
Total Protein: 5.6 g/dL — ABNORMAL LOW (ref 6.5–8.1)

## 2021-11-12 LAB — RESP PANEL BY RT-PCR (FLU A&B, COVID) ARPGX2
Influenza A by PCR: NEGATIVE
Influenza B by PCR: NEGATIVE
SARS Coronavirus 2 by RT PCR: NEGATIVE

## 2021-11-12 LAB — CBC
HCT: 15.7 % — ABNORMAL LOW (ref 39.0–52.0)
Hemoglobin: 5.1 g/dL — CL (ref 13.0–17.0)
MCH: 28.7 pg (ref 26.0–34.0)
MCHC: 32.5 g/dL (ref 30.0–36.0)
MCV: 88.2 fL (ref 80.0–100.0)
Platelets: 284 10*3/uL (ref 150–400)
RBC: 1.78 MIL/uL — ABNORMAL LOW (ref 4.22–5.81)
RDW: 20.8 % — ABNORMAL HIGH (ref 11.5–15.5)
WBC: 21.2 10*3/uL — ABNORMAL HIGH (ref 4.0–10.5)
nRBC: 0 % (ref 0.0–0.2)

## 2021-11-12 LAB — I-STAT CHEM 8, ED
BUN: 40 mg/dL — ABNORMAL HIGH (ref 6–20)
Calcium, Ion: 0.93 mmol/L — ABNORMAL LOW (ref 1.15–1.40)
Chloride: 96 mmol/L — ABNORMAL LOW (ref 98–111)
Creatinine, Ser: 4.7 mg/dL — ABNORMAL HIGH (ref 0.61–1.24)
Glucose, Bld: 122 mg/dL — ABNORMAL HIGH (ref 70–99)
HCT: 18 % — ABNORMAL LOW (ref 39.0–52.0)
Hemoglobin: 6.1 g/dL — CL (ref 13.0–17.0)
Potassium: 4 mmol/L (ref 3.5–5.1)
Sodium: 136 mmol/L (ref 135–145)
TCO2: 29 mmol/L (ref 22–32)

## 2021-11-12 LAB — PREPARE RBC (CROSSMATCH)

## 2021-11-12 LAB — POC OCCULT BLOOD, ED: Fecal Occult Bld: POSITIVE — AB

## 2021-11-12 MED ORDER — IOHEXOL 350 MG/ML SOLN
100.0000 mL | Freq: Once | INTRAVENOUS | Status: AC | PRN
  Administered 2021-11-12: 100 mL via INTRAVENOUS

## 2021-11-12 MED ORDER — OXYCODONE HCL 5 MG PO TABS
10.0000 mg | ORAL_TABLET | Freq: Two times a day (BID) | ORAL | Status: DC | PRN
  Administered 2021-11-13 – 2021-11-17 (×7): 10 mg via ORAL
  Filled 2021-11-12 (×7): qty 2

## 2021-11-12 MED ORDER — IPRATROPIUM-ALBUTEROL 0.5-2.5 (3) MG/3ML IN SOLN
3.0000 mL | RESPIRATORY_TRACT | Status: DC | PRN
  Administered 2021-11-14 (×3): 3 mL via RESPIRATORY_TRACT
  Filled 2021-11-12 (×2): qty 3

## 2021-11-12 MED ORDER — SODIUM CHLORIDE 0.9 % IV SOLN: 10.0000 mL/h | Freq: Once | INTRAVENOUS | Status: DC

## 2021-11-12 MED ORDER — SEVELAMER CARBONATE 800 MG PO TABS
1600.0000 mg | ORAL_TABLET | Freq: Three times a day (TID) | ORAL | Status: DC
  Administered 2021-11-13 – 2021-11-19 (×10): 1600 mg via ORAL
  Filled 2021-11-12 (×12): qty 2

## 2021-11-12 MED ORDER — VITAMIN K1 10 MG/ML IJ SOLN
5.0000 mg | Freq: Once | INTRAMUSCULAR | Status: AC
  Administered 2021-11-13: 5 mg via SUBCUTANEOUS
  Filled 2021-11-12 (×2): qty 0.5

## 2021-11-12 MED ORDER — SODIUM CHLORIDE 0.9 % IV BOLUS: 500.0000 mL | Freq: Once | INTRAVENOUS | Status: DC

## 2021-11-12 MED ORDER — CALCIUM GLUCONATE-NACL 1-0.675 GM/50ML-% IV SOLN
1.0000 g | Freq: Once | INTRAVENOUS | Status: AC
  Administered 2021-11-13: 1000 mg via INTRAVENOUS
  Filled 2021-11-12 (×2): qty 50

## 2021-11-12 MED ORDER — IPRATROPIUM-ALBUTEROL 0.5-2.5 (3) MG/3ML IN SOLN
3.0000 mL | Freq: Four times a day (QID) | RESPIRATORY_TRACT | Status: DC
  Administered 2021-11-12: 3 mL via RESPIRATORY_TRACT
  Filled 2021-11-12: qty 3

## 2021-11-12 MED ORDER — SODIUM CHLORIDE 0.9% IV SOLUTION: Freq: Once | INTRAVENOUS | Status: AC

## 2021-11-12 MED ORDER — PANTOPRAZOLE INFUSION (NEW) - SIMPLE MED
8.0000 mg/h | INTRAVENOUS | Status: DC
  Administered 2021-11-12 – 2021-11-14 (×5): 8 mg/h via INTRAVENOUS
  Filled 2021-11-12 (×2): qty 100
  Filled 2021-11-12 (×2): qty 80
  Filled 2021-11-12 (×2): qty 100
  Filled 2021-11-12 (×2): qty 80

## 2021-11-12 MED ORDER — FUROSEMIDE 10 MG/ML IJ SOLN
40.0000 mg | Freq: Once | INTRAMUSCULAR | Status: AC
  Administered 2021-11-12: 40 mg via INTRAVENOUS
  Filled 2021-11-12: qty 4

## 2021-11-12 MED ORDER — PANTOPRAZOLE 80MG IVPB - SIMPLE MED
80.0000 mg | Freq: Once | INTRAVENOUS | Status: AC
  Administered 2021-11-12: 80 mg via INTRAVENOUS
  Filled 2021-11-12: qty 80

## 2021-11-12 NOTE — Consult Note (Signed)
Surgical Evaluation Requesting provider: Dr. Shirlyn Goltz  Chief Complaint: GI bleeding  HPI: This is a 61 year old male with multiple significant medical problems including end-stage renal disease on dialysis (Tuesday/Thursday/Saturday), congestive heart failure with EF 40 to 45% as of December and a now large pericardial effusion, hypertension, anemia of chronic disease, history of colon cancer status post laparoscopic-assisted right colectomy in [place] in 2014, BPH, heroin/polysubstance abuse who is known to our service following laparotomy for lysis of adhesions for bowel obstruction in August 2022.  Developed large volume ascites a few weeks after this requiring readmission with work-up suspicious for nephrogenic ascites.  He was readmitted again in December with hypervolemia, acute on chronic systolic congestive heart failure, multifocal pneumonia and encephalopathy.  His discharge weight from that hospitalization was 61.5 kg, today he is 71.5 kg.  He was again admitted in mid January with pulmonary edema, and then again about 1 week ago with GI bleeding suspected from a duodenal ulcer which was treated endoscopically and with GDA embolization.  He was discharged 3 days ago.  Earlier today he developed bright red blood per rectum and hematemesis.  This was associated with lightheadedness and dizziness. Has not had further bleeding since arrival in the emergency room earlier today.  Hemoglobin on presentation was 5.1.  CTA completed without active extravasation, nor flow in the embolized GDA.  Also demonstrates a large pericardial effusion and ascites.  He has been started on a PPI drip, and is receiving a blood transfusion now.  Denies abdominal pain.  No Known Allergies  Past Medical History:  Diagnosis Date   Anemia of chronic kidney failure    BPH (benign prostatic hyperplasia)    Colon cancer ([place]) 2014   End stage renal disease on dialysis ([place]) 2017   Hypertension    Hypertensive heart  disease with chronic diastolic congestive heart failure ([place]) 07/17/2016   Polysubstance abuse ([place])    History of heroin and marijuana use    Past Surgical History:  Procedure Laterality Date   ABDOMINAL SURGERY     AV FISTULA PLACEMENT Left 09/19/2016   Procedure: Left arm Radiocephalic ARTERIOVENOUS (AV) FISTULA CREATION;  Surgeon: Conrad [place], MD;  Location: [facility];  Service: Vascular;  Laterality: Left;   BASCILIC VEIN TRANSPOSITION Left 07/09/2017   Procedure: BRACHIOCEPHALIC FISTULA CREATION;  Surgeon: Conrad Ramsey, MD;  Location: [facility];  Service: Vascular;  Laterality: Left;   COLON SURGERY  2014   ESOPHAGOGASTRODUODENOSCOPY (EGD) WITH PROPOFOL N/A 11/05/2021   Procedure: ESOPHAGOGASTRODUODENOSCOPY (EGD) WITH PROPOFOL;  Surgeon: [place] Ada, MD;  Location: [facility];  Service: Endoscopy;  Laterality: N/A;   HEMOSTASIS CONTROL  11/05/2021   Procedure: HEMOSTASIS CONTROL;  Surgeon: [place] Ada, MD;  Location: [facility];  Service: Endoscopy;;   INSERTION OF DIALYSIS CATHETER N/A 09/19/2016   Procedure: INSERTION OF TUNNELED DIALYSIS CATHETER;  Surgeon: Conrad [place], MD;  Location: [facility];  Service: Vascular;  Laterality: N/A;   IR ANGIOGRAM VISCERAL SELECTIVE  11/05/2021   IR EMBO ART  VEN HEMORR LYMPH EXTRAV  INC GUIDE ROADMAPPING  11/05/2021   IR GENERIC HISTORICAL  09/14/2016   IR US GUIDE VASC ACCESS RIGHT 09/14/2016 [name], DO [facility]   IR GENERIC HISTORICAL  09/14/2016   IR FLUORO GUIDE CV LINE RIGHT 09/14/2016 [name], DO [facility]   IR PARACENTESIS  06/22/2021   IR US GUIDE VASC ACCESS RIGHT  11/05/2021   LAPAROTOMY N/A 05/23/2021   Procedure: EXPLORATORY LAPAROTOMY LYSIS ADHESIONS;  Surgeon: [name], MD;  Location: [facility];  Service: General;  Laterality: N/A;   ORIF TIBIA PLATEAU Left 01/15/2018   Procedure: OPEN REDUCTION INTERNAL FIXATION (ORIF) TIBIAL PLATEAU;  Surgeon: Alt[place]e [place], MD;  Location: [facility];  Service: Orthopedics;  Laterality:  Left;   SCLEROTHERAPY  11/05/2021   Procedure: Clide Deutscher;  Surgeon: [place] Ada, MD;  Location: [facility] ENDOSCOPY;  Service: Endoscopy;;   TRANSTHORACIC ECHOCARDIOGRAM  07/2016    EF 60-65%, No RWMA. Mod Concentric LVH - Gr 2 DD. Severe LA dilation. PAP ~35 mmHg (mild Pulm HTN)  --> no changes noted 1 month later    Family History  Problem Relation Age of Onset   Heart failure Mother        Died at age 55.   Heart attack Mother 56   Hypertension Mother    Diabetes Mellitus II Mother    Other Father        Unknown   Kidney failure Sister        (Oldest Sister)   Other Other        Multiple siblings have started her heart disease, he is not sure of the details.   CAD Nephew     Social History   Socioeconomic History   Marital status: Married    Spouse name: Not on file   Number of children: Not on file   Years of education: Not on file   Highest education level: Not on file  Occupational History   Occupation: disabled  Tobacco Use   Smoking status: Some Days    Packs/day: 0.25    Types: Cigarettes   Smokeless tobacco: Never  Vaping Use   Vaping Use: Never used  Substance and Sexual Activity   Alcohol use: No    Alcohol/week: 7.0 standard drinks    Types: 7 Cans of beer per week   Drug use: Yes    Frequency: 1.0 times per week    Types: Marijuana, Heroin    Comment: reports quitting heroin 15 years ago; can't afford daily marijuana   Sexual activity: Not on file    Comment: once a week  Other Topics Concern   Not on file  Social History Narrative   Not on file   Social Determinants of Health   Financial Resource Strain: Not on file  Food Insecurity: Not on file  Transportation Needs: Not on file  Physical Activity: Not on file  Stress: Not on file  Social Connections: Not on file    No current facility-administered medications on file prior to encounter.   Current Outpatient Medications on File Prior to Encounter  Medication Sig Dispense Refill    carvedilol (COREG) 6.25 MG tablet Take 1 tablet (6.25 mg total) by mouth 2 (two) times daily with a meal. 60 tablet 0   LIDOCAINE-PRILOCAINE EX Apply 1 application topically See admin instructions. Prior to dialysis Tuesday,Thursday and saturday     Oxycodone HCl 10 MG TABS Take 10 mg by mouth 2 (two) times daily as needed (pain).     pantoprazole (PROTONIX) 40 MG tablet Take 1 tablet (40 mg total) by mouth 2 (two) times daily. 60 tablet 2   sevelamer carbonate (RENVELA) 800 MG tablet Take 1,600 mg by mouth with breakfast, with lunch, and with evening meal.      Review of Systems: a complete, 10pt review of systems was completed with pertinent positives and negatives as documented in the HPI  Physical Exam: Vitals:   11/12/21 1700 11/12/21 1730  BP: 119/84 136/83  Pulse: 89 89  Resp: 18 18  Temp:    SpO2: 99% 98%   Gen: Sleepy, chronically ill appearing Head atraumatic, extraocular motions intact Neck: supple without mass or thyromegaly Chest: Labored respirations with bilateral expiratory wheezing and productive sounding cough Cardiovascular: Normotensive, heart rate is regular and in the 80s, palpable distal pulses Gastrointestinal: soft, nondistended, nontender.  Well-healed midline incision. Lymphatic: no lymphadenopathy in the neck or groin Muscoloskeletal: no clubbing or cyanosis of the fingers.  Strength is symmetrical throughout.  Range of motion of bilateral upper and lower extremities normal without pain, crepitation or contracture. Skin: warm and dry   CBC Latest Ref Rng & Units 11/12/2021 11/12/2021 11/09/2021  WBC 4.0 - 10.5 K/uL - 21.2(H) 11.7(H)  Hemoglobin 13.0 - 17.0 g/dL 6.1(LL) 5.1(LL) 7.9(L)  Hematocrit 39.0 - 52.0 % 18.0(L) 15.7(L) 23.4(L)  Platelets 150 - 400 K/uL - 284 229    CMP Latest Ref Rng & Units 11/12/2021 11/12/2021 11/09/2021  Glucose 70 - 99 mg/dL 122(H) 130(H) 93  BUN 6 - 20 mg/dL 40(H) 41(H) 89(H)  Creatinine 0.61 - 1.24 mg/dL 4.70(H) 4.68(H)  6.52(H)  Sodium 135 - 145 mmol/L 136 138 128(L)  Potassium 3.5 - 5.1 mmol/L 4.0 4.1 4.6  Chloride 98 - 111 mmol/L 96(L) 98 90(L)  CO2 22 - 32 mmol/L - 27 21(L)  Calcium 8.9 - 10.3 mg/dL - 7.7(L) 8.1(L)  Total Protein 6.5 - 8.1 g/dL - 5.6(L) -  Total Bilirubin 0.3 - 1.2 mg/dL - 0.5 -  Alkaline Phos 38 - 126 U/L - 55 -  AST 15 - 41 U/L - 22 -  ALT 0 - 44 U/L - 27 -    Lab Results  Component Value Date   INR 1.5 (H) 11/07/2021   INR 1.5 (H) 11/04/2021   INR 1.11 01/15/2018    Imaging: CT Angio Abd/Pel W and/or Wo Contrast  Result Date: 11/12/2021 CLINICAL DATA:  via EMS; reported found on the floor;rectal bleeding; approx 400 ml blood loss per EMS; reported patient is also a dialysis patient and had a dull session today. Per EMS patient is slow to response; stated prior hx of bleeding ulcers and has had + blood on vomiting also. EXAM: CTA ABDOMEN AND PELVIS WITHOUT AND WITH CONTRAST TECHNIQUE: Multidetector CT imaging of the abdomen and pelvis was performed using the standard protocol during bolus administration of intravenous contrast. Multiplanar reconstructed images and MIPs were obtained and reviewed to evaluate the vascular anatomy. RADIATION DOSE REDUCTION: This exam was performed according to the departmental dose-optimization program which includes automated exposure control, adjustment of the mA and/or kV according to patient size and/or use of iterative reconstruction technique. CONTRAST:  183mL OMNIPAQUE IOHEXOL 350 MG/ML SOLN COMPARISON:  CT, 11/05/2021. FINDINGS: VASCULAR Aorta: Normal in caliber. Diffuse atherosclerosis but no significant stenosis. No dissection. Celiac: Mild atherosclerosis at the origin. No significant stenosis. There are micro embolization coils along the gastric duodenal artery, new since the prior CT. No flow seen in the gastric duodenal artery. SMA: Patent without evidence of aneurysm, dissection, vasculitis or significant stenosis. Renals: Both renal arteries  are patent without evidence of aneurysm, dissection, vasculitis, fibromuscular dysplasia or significant stenosis. IMA: Small I IMA, not well-defined. Inflow: Common and external iliac artery atherosclerosis with no significant stenosis. Proximal Outflow: Common femoral artery atherosclerosis with no significant stenosis. Patent proximal femoral arteries and deep femoral arteries. Veins: No obvious venous abnormality within the limitations of this arterial phase study. Review of the MIP  images confirms the above findings. NON-VASCULAR Lower chest: Focal area of opacity in the dependent right lower lobe, new. This is peribronchovascular and may be infectious or inflammatory but is nonspecific. Hepatobiliary: Unremarkable liver. Gallbladder mostly contracted. No convincing bile duct dilation. Pancreas: Duct is dilated up to 8 mm, measuring larger than on the recent prior CT. No pancreatic mass or convincing inflammation. Spleen: Low-density lesion over the dome of the spleen, 1.5 cm in size. This is consistent with a cyst and is unchanged. No other splenic abnormality. Adrenals/Urinary Tract: No adrenal masses. Marked bilateral renal atrophy with multiple renal masses. The largest of these arises from the anterior right kidney midpole, has an enhancing rim and is relative increased attenuation compared to other low-density masses. This is consistent with a chronic hemorrhagic cyst, and was previously evaluated with MRI on 06/21/2021. No renal stones. No hydronephrosis. Ureters not well visualized, but no evidence of ureteral dilation. Bladder is decompressed. Stomach/Bowel: Stomach is unremarkable. Small bowel and colon are normal in overall caliber. Right mid abdomen colonic anastomosis staples consistent with an ileocolic anastomosis. Multiple additional vascular clips in the right lower quadrant and right mid abdomen. Small bowel and colon are not well-defined due to adjacent ascites, limiting contrast. No  convincing bowel inflammation. There is no extravasation of contrast to indicate an active GI bleeding source. Lymphatic: No enlarged lymph nodes. Reproductive: Unremarkable. Other: Moderate ascites similar to the prior CT. Musculoskeletal: No fracture or acute finding.  No bone lesion. IMPRESSION: VASCULAR 1. No evidence of an active GI bleeding source. 2. No enhancement/flow in the embolized gastro duodenal artery. 3. Aortic atherosclerosis. No aneurysm, dissection or significant stenosis. 4. Small irregular inferior mesenteric artery. Remaining aortic branch vessels are widely patent. NON-VASCULAR 1. No convincing acute findings. 2. Pancreatic duct is more dilated than on the prior CT, up to 8 mm, of unclear etiology. 3. Findings of end-stage renal disease. 4. Moderate ascites similar to the prior CT. 5. Stable changes from previous bowel surgery. Electronically Signed   By: Lajean Manes M.D.   On: 11/12/2021 17:05     A/P: Recurrent GI bleeding in the setting of known duodenal ulcer previously successfully treated 1/21 with endoscopy followed by GDA embolization.  At this time he is hemodynamically stable with a heart rate that is regular in the 80s and normotensive.  Benign abdominal exam.  Receiving blood transfusion for a presenting hemoglobin of 5.1, though appears significantly fluid overloaded and hemoglobin may be partially dilutional.  No further episodes of hematochezia or hematemesis since arriving here.  Agree with current plans for endoscopy, given hematochezia perhaps colonoscopy is also warranted.  If he rebleeds would recommend attempted endoscopic treatment (at least to definitively localize) and repeat angiogram/possible embolization prior to considering surgery.  I do not think he would survive an operation in his current state.   ESRD (HD T/Th/Sa)  CHF (EF 40 to 45%) Multiple recent episodes of fluid overload-appears fluid overloaded now with a weight 10 kg higher than it was in  December Recent episode of pneumonia and encephalopathy Large pericardial effusion Ascites Hypertension Anemia of chronic disease Polysubstance abuse, heroin History of colon cancer status post laparoscopic-assisted right colectomy 2014, history of small bowel obstruction status post open lysis of adhesions 2022 BPH     Patient Active Problem List   Diagnosis Date Noted   Duodenal ulcer with hemorrhage    Upper GI bleed 11/05/2021   GI bleed 11/05/2021   Anemia in ESRD (end-stage renal disease) ([place])  11/01/2021   Hyperkalemia 11/01/2021   Volume overload 10/31/2021   SOB (shortness of breath) 10/31/2021   Acute hyponatremia 10/31/2021   Multifocal pneumonia 10/06/2021   Hypervolemia associated with renal insufficiency 10/05/2021   Pericardial effusion without cardiac tamponade 10/05/2021   Chronic combined systolic and diastolic CHF (congestive heart failure) ([place]) 10/05/2021   Elevated procalcitonin 10/05/2021   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/2021   Abdominal pain    Renal mass    Major depressive disorder, single episode, severe ([place]) 06/18/2021   Bacterial peritonitis ([place])    Other ascites    Protein-calorie malnutrition, severe 06/13/2021   Ileus ([place]) 06/11/2021   Small bowel obstruction ([place]) 05/20/2021   DOE (dyspnea on exertion) 10/25/2020   Orthopnea 10/25/2020   Open wound of left hand 01/18/2018   Tibial fracture 01/14/2018   Tibial plateau fracture, left 01/13/2018   Malnutrition of moderate degree 09/20/2016   AKI (acute kidney injury) ([place])    History of colon cancer    Hyponatremia    Leukocytosis    Acute blood loss anemia    Acute metabolic encephalopathy 74/94/4967   ESRD on hemodialysis ([place]) 09/09/2016   Hypertensive emergency 09/09/2016   Acute respiratory failure with hypoxia ([place]) 09/09/2016   Acute pulmonary edema ([place]) 09/09/2016   Substance abuse ([place]) 09/09/2016   Nonadherence to medical treatment 09/09/2016   Anemia due to chronic  kidney disease 09/09/2016   Altered mental status 09/09/2016   Acute respiratory failure ([place])    Drug ingestion    Hypertensive heart and chronic kidney disease with heart failure and with stage 5 chronic kidney disease, or end stage renal disease ([place]) 07/19/2016   Chest pain    Elevated troponin 59/16/3846   Acute diastolic heart failure, NYHA class 2 ([place]) 07/17/2016   Heroin abuse ([place]) 07/17/2016   HTN (hypertension) 07/17/2016   Benign prostate hyperplasia 07/16/2015       Romana Juniper, MD [facility]  See AMION to contact appropriate on-call provider

## 2021-11-12 NOTE — ED Notes (Signed)
First pt contact. Pt BIBA, a/ox4, lethargic. Pinpoint pupils. Pt c/o melena with dizziness today. +syncope. Abd soft nontender, denies n/v today. Rectal inspection shows clots around anus. LS exp wheezes throughout. Other VSS

## 2021-11-12 NOTE — H&P (Signed)
History and Physical    Timothy [name] WNI:[id number] DOB: 15-Dec-1960 DOA: 11/12/2021  PCP: [name], MD (Confirm with patient/family/NH records and if not entered, this has to be entered at [facility] point of entry) Patient coming from: Home  I have personally briefly reviewed patient's old medical records in [place]  Chief Complaint: Rectal bleed  HPI: Timothy [name] is a 61 y.o. 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  Patient was found by family member on bathroom floor with large quantity of rectal bleed and called EMS.  EMS arrived and estimated blood loss about 400 ml.  Currently, patient is awake lethargic, patient does not remember what has happened. But denied any abdominal pain nauseous or vomiting.  Patient has not gone to today's HD.  ED Course: No tachycardia no hypotension.  Hb=5.1, emergency CT angiogram done did not show any significant active bleed.  GI consulted, and started patient on PPI drip.  General surgeon is aware.  Review of Systems: Unable to perform, patient lethargic.  Past Medical History:  Diagnosis Date   Anemia of chronic kidney failure    BPH (benign prostatic hyperplasia)    Colon cancer ([place]) 2014   End stage renal disease on dialysis ([place]) 2017   Hypertension    Hypertensive heart disease with chronic diastolic congestive heart failure ([place]) 07/17/2016   Polysubstance abuse ([place])    History of heroin and marijuana use    Past Surgical History:  Procedure Laterality Date   ABDOMINAL SURGERY     AV FISTULA PLACEMENT Left 09/19/2016   Procedure: Left arm Radiocephalic ARTERIOVENOUS (AV) FISTULA CREATION;  Surgeon: Conrad [place], MD;  Location: [place];  Service: Vascular;  Laterality: Left;   [place] Left 07/09/2017   Procedure: BRACHIOCEPHALIC FISTULA CREATION;  Surgeon: Conrad Donley, MD;  Location: [place];  Service: Vascular;  Laterality: Left;   COLON SURGERY  2014   ESOPHAGOGASTRODUODENOSCOPY (EGD) WITH PROPOFOL N/A 11/05/2021   Procedure: ESOPHAGOGASTRODUODENOSCOPY (EGD) WITH PROPOFOL;  Surgeon: Carol Ada, MD;  Location: [place];  Service: Endoscopy;  Laterality: N/A;   HEMOSTASIS CONTROL  11/05/2021   Procedure: HEMOSTASIS CONTROL;  Surgeon: Carol Ada, MD;  Location: [place];  Service: Endoscopy;;   INSERTION OF DIALYSIS CATHETER N/A 09/19/2016   Procedure: INSERTION OF TUNNELED DIALYSIS CATHETER;  Surgeon: Conrad Vidalia, MD;  Location: [place];  Service: Vascular;  Laterality: N/A;   IR ANGIOGRAM VISCERAL SELECTIVE  11/05/2021   IR EMBO ART  VEN HEMORR LYMPH EXTRAV  INC GUIDE ROADMAPPING  11/05/2021   IR GENERIC HISTORICAL  09/14/2016   IR US GUIDE VASC ACCESS RIGHT 09/14/2016 [name], DO [facility]   IR GENERIC HISTORICAL  09/14/2016   IR FLUORO GUIDE CV LINE RIGHT 09/14/2016 [name], DO [facility]   IR PARACENTESIS  06/22/2021   IR US GUIDE [place] RIGHT  11/05/2021   LAPAROTOMY N/A 05/23/2021   Procedure: EXPLORATORY LAPAROTOMY LYSIS ADHESIONS;  Surgeon: [name], MD;  Location: [place];  Service: General;  Laterality: N/A;   ORIF TIBIA PLATEAU Left 01/15/2018   Procedure: OPEN REDUCTION INTERNAL FIXATION (ORIF) TIBIAL PLATEAU;  Surgeon: Altamese [place], MD;  Location: [place];  Service: Orthopedics;  Laterality: Left;   SCLEROTHERAPY  11/05/2021   Procedure: Clide Deutscher;  Surgeon: Benson Norway,  Saralyn Pilar, MD;  Location: [place];  Service: Endoscopy;;   TRANSTHORACIC ECHOCARDIOGRAM  07/2016    EF 60-65%, No RWMA. Mod Concentric LVH - Gr 2 DD. Severe LA dilation. PAP ~35 mmHg (mild Pulm HTN)  --> no changes noted 1 month later     reports that he has been smoking cigarettes. He has been smoking an average of .25 packs per day. He has never  used smokeless tobacco. He reports current drug use. Frequency: 1.00 time per week. Drugs: Marijuana and Heroin. He reports that he does not drink alcohol.  No Known Allergies  Family History  Problem Relation Age of Onset   Heart failure Mother        Died at age 58.   Heart attack Mother 59   Hypertension Mother    Diabetes Mellitus II Mother    Other Father        Unknown   Kidney failure Sister        (Oldest Sister)   Other Other        Multiple siblings have started her heart disease, he is not sure of the details.   CAD Nephew      Prior to Admission medications   Medication Sig Start Date End Date Taking? Authorizing Provider  carvedilol (COREG) 6.25 MG tablet Take 1 tablet (6.25 mg total) by mouth 2 (two) times daily with a meal. 10/09/21   Sharen Hones, MD  LIDOCAINE-PRILOCAINE EX Apply 1 application topically See admin instructions. Prior to dialysis Tuesday,Thursday and saturday    [provider]  Oxycodone HCl 10 MG TABS Take 10 mg by mouth 2 (two) times daily as needed (pain). 10/26/21   [provider]  pantoprazole (PROTONIX) 40 MG tablet Take 1 tablet (40 mg total) by mouth 2 (two) times daily. 11/09/21   Geradine Girt, DO  sevelamer carbonate (RENVELA) 800 MG tablet Take 1,600 mg by mouth with breakfast, with lunch, and with evening meal. 05/12/21   [provider]    Physical Exam: Vitals:   11/12/21 1700 11/12/21 1730 11/12/21 1745 11/12/21 1800  BP: 119/84 136/83 123/89 122/64  Pulse: 89 89 88 87  Resp: 18 18 18 18   Temp:      TempSrc:      SpO2: 99% 98% 98% 100%    Constitutional: NAD, calm, comfortable Vitals:   11/12/21 1700 11/12/21 1730 11/12/21 1745 11/12/21 1800  BP: 119/84 136/83 123/89 122/64  Pulse: 89 89 88 87  Resp: 18 18 18 18   Temp:      TempSrc:      SpO2: 99% 98% 98% 100%   Eyes: PERRL, lids and conjunctivae normal ENMT: Mucous membranes are moist. Posterior pharynx clear of any exudate or  lesions.Normal dentition.  Neck: normal, supple, no masses, no thyromegaly Respiratory: clear to auscultation bilaterally, diffused wheezing, fine crackles on B/L lower field.  Increasing respiratory effort.,  Talking in broken sentences no accessory muscle use.  Cardiovascular: Regular rate and rhythm, no murmurs / rubs / gallops. No extremity edema. 2+ pedal pulses. No carotid bruits.  Abdomen: no tenderness, no masses palpated. No hepatosplenomegaly. Bowel sounds positive.  Musculoskeletal: no clubbing / cyanosis. No joint deformity upper and lower extremities. Good ROM, no contractures. Normal muscle tone.  Skin: no rashes, lesions, ulcers. No induration Neurologic: No facial droops, moving all limbs, lethargic but following simple commands. Psychiatric: Lethargic, in respiratory distress.   Labs on Admission: I have personally reviewed following labs and imaging studies  CBC:  Recent Labs  Lab 11/06/21 0359 11/06/21 1200 11/07/21 0859 11/08/21 0421 11/09/21 0434 11/12/21 1501 11/12/21 1526  WBC 13.4*  --  14.8* 14.8* 11.7* 21.2*  --   NEUTROABS  --   --  11.0* 10.8* 7.8*  --   --   HGB 8.0*   < > 7.9* 8.1* 7.9* 5.1* 6.1*  HCT 22.9*   < > 23.4* 23.9* 23.4* 15.7* 18.0*  MCV 82.7  --  83.0 83.0 84.2 88.2  --   PLT 206  --  236 235 229 284  --    < > = values in this interval not displayed.   Basic Metabolic Panel: Recent Labs  Lab 11/06/21 0355 11/06/21 0359 11/06/21 0359 11/07/21 0859 11/08/21 0421 11/09/21 0434 11/12/21 1501 11/12/21 1526  NA  --  132*   < > 132* 129* 128* 138 136  K  --  4.8   < > 4.6 5.3* 4.6 4.1 4.0  CL  --  94*   < > 94* 91* 90* 98 96*  CO2  --  22  --  19* 17* 21* 27  --   GLUCOSE  --  88   < > 124* 90 93 130* 122*  BUN  --  111*   < > 151* 154* 89* 41* 40*  CREATININE  --  6.92*   < > 8.25* 8.55* 6.52* 4.68* 4.70*  CALCIUM  --  7.6*  --  7.8* 7.7* 8.1* 7.7*  --   MG 2.6*  --   --   --   --   --   --   --   PHOS  --   --   --  5.3* 6.4* 6.5*   --   --    < > = values in this interval not displayed.   GFR: Estimated Creatinine Clearance: 16.9 mL/min (A) (by C-G formula based on SCr of 4.7 mg/dL (H)). Liver Function Tests: Recent Labs  Lab 11/07/21 0859 11/07/21 1207 11/08/21 0421 11/09/21 0434 11/12/21 1501  AST  --  48*  --   --  22  ALT  --  66*  --   --  27  ALKPHOS  --  78  --   --  55  BILITOT  --  1.1  --   --  0.5  PROT  --  6.5  --   --  5.6*  ALBUMIN 2.4* 2.4* 2.5* 2.4* 2.0*   No results for input(s): LIPASE, AMYLASE in the last 168 hours. No results for input(s): AMMONIA in the last 168 hours. Coagulation Profile: Recent Labs  Lab 11/07/21 1207  INR 1.5*   Cardiac Enzymes: No results for input(s): CKTOTAL, CKMB, CKMBINDEX, TROPONINI in the last 168 hours. BNP (last 3 results) No results for input(s): PROBNP in the last 8760 hours. HbA1C: No results for input(s): HGBA1C in the last 72 hours. CBG: Recent Labs  Lab 11/06/21 0354 11/06/21 0745 11/06/21 1159 11/06/21 1515 11/06/21 1923  GLUCAP 81 81 88 125* 130*   Lipid Profile: No results for input(s): CHOL, HDL, LDLCALC, TRIG, CHOLHDL, LDLDIRECT in the last 72 hours. Thyroid Function Tests: No results for input(s): TSH, T4TOTAL, FREET4, T3FREE, THYROIDAB in the last 72 hours. Anemia Panel: No results for input(s): VITAMINB12, FOLATE, FERRITIN, TIBC, IRON, RETICCTPCT in the last 72 hours. Urine analysis:    Component Value Date/Time   COLORURINE YELLOW 05/19/2021 2015   APPEARANCEUR HAZY (A) 05/19/2021 2015   LABSPEC 1.012 05/19/2021 2015  PHURINE 9.0 (H) 05/19/2021 2015   GLUCOSEU NEGATIVE 05/19/2021 2015   HGBUR NEGATIVE 05/19/2021 2015   BILIRUBINUR NEGATIVE 05/19/2021 2015   KETONESUR NEGATIVE 05/19/2021 2015   PROTEINUR >=300 (A) 05/19/2021 2015   NITRITE NEGATIVE 05/19/2021 2015   LEUKOCYTESUR SMALL (A) 05/19/2021 2015    Radiological Exams on Admission: CT Angio Abd/Pel W and/or Wo Contrast  Result Date: 11/12/2021 CLINICAL  DATA:  via EMS; reported found on the floor;rectal bleeding; approx 400 ml blood loss per EMS; reported patient is also a dialysis patient and had a dull session today. Per EMS patient is slow to response; stated prior hx of bleeding ulcers and has had + blood on vomiting also. EXAM: CTA ABDOMEN AND PELVIS WITHOUT AND WITH CONTRAST TECHNIQUE: Multidetector CT imaging of the abdomen and pelvis was performed using the standard protocol during bolus administration of intravenous contrast. Multiplanar reconstructed images and MIPs were obtained and reviewed to evaluate the vascular anatomy. RADIATION DOSE REDUCTION: This exam was performed according to the departmental dose-optimization program which includes automated exposure control, adjustment of the mA and/or kV according to patient size and/or use of iterative reconstruction technique. CONTRAST:  132mL OMNIPAQUE IOHEXOL 350 MG/ML SOLN COMPARISON:  CT, 11/05/2021. FINDINGS: VASCULAR Aorta: Normal in caliber. Diffuse atherosclerosis but no significant stenosis. No dissection. Celiac: Mild atherosclerosis at the origin. No significant stenosis. There are micro embolization coils along the gastric duodenal artery, new since the prior CT. No flow seen in the gastric duodenal artery. SMA: Patent without evidence of aneurysm, dissection, vasculitis or significant stenosis. Renals: Both renal arteries are patent without evidence of aneurysm, dissection, vasculitis, fibromuscular dysplasia or significant stenosis. IMA: Small I IMA, not well-defined. Inflow: Common and external iliac artery atherosclerosis with no significant stenosis. Proximal Outflow: Common femoral artery atherosclerosis with no significant stenosis. Patent proximal femoral arteries and deep femoral arteries. Veins: No obvious venous abnormality within the limitations of this arterial phase study. Review of the MIP images confirms the above findings. NON-VASCULAR Lower chest: Focal area of opacity in the  dependent right lower lobe, new. This is peribronchovascular and may be infectious or inflammatory but is nonspecific. Hepatobiliary: Unremarkable liver. Gallbladder mostly contracted. No convincing bile duct dilation. Pancreas: Duct is dilated up to 8 mm, measuring larger than on the recent prior CT. No pancreatic mass or convincing inflammation. Spleen: Low-density lesion over the dome of the spleen, 1.5 cm in size. This is consistent with a cyst and is unchanged. No other splenic abnormality. Adrenals/Urinary Tract: No adrenal masses. Marked bilateral renal atrophy with multiple renal masses. The largest of these arises from the anterior right kidney midpole, has an enhancing rim and is relative increased attenuation compared to other low-density masses. This is consistent with a chronic hemorrhagic cyst, and was previously evaluated with MRI on 06/21/2021. No renal stones. No hydronephrosis. Ureters not well visualized, but no evidence of ureteral dilation. Bladder is decompressed. Stomach/Bowel: Stomach is unremarkable. Small bowel and colon are normal in overall caliber. Right mid abdomen colonic anastomosis staples consistent with an ileocolic anastomosis. Multiple additional vascular clips in the right lower quadrant and right mid abdomen. Small bowel and colon are not well-defined due to adjacent ascites, limiting contrast. No convincing bowel inflammation. There is no extravasation of contrast to indicate an active GI bleeding source. Lymphatic: No enlarged lymph nodes. Reproductive: Unremarkable. Other: Moderate ascites similar to the prior CT. Musculoskeletal: No fracture or acute finding.  No bone lesion. IMPRESSION: VASCULAR 1. No evidence of an active GI  bleeding source. 2. No enhancement/flow in the embolized gastro duodenal artery. 3. Aortic atherosclerosis. No aneurysm, dissection or significant stenosis. 4. Small irregular inferior mesenteric artery. Remaining aortic branch vessels are widely  patent. NON-VASCULAR 1. No convincing acute findings. 2. Pancreatic duct is more dilated than on the prior CT, up to 8 mm, of unclear etiology. 3. Findings of end-stage renal disease. 4. Moderate ascites similar to the prior CT. 5. Stable changes from previous bowel surgery. Electronically Signed   By: Lajean Manes M.D.   On: 11/12/2021 17:05    EKG: Ordered  Assessment/Plan Principal Problem:   GI bleed Active Problems:   Duodenal ulcer with hemorrhage  (please populate well all problems here in Problem List. (For example, if patient is on BP meds at home and you resume or decide to hold them, it is a problem that needs to be her. Same for CAD, COPD, HLD and so on)  Acute blood loss anemia secondary to recurrent GI bleed -Likely recurrent duodenum bulb ulcer bleed -ED ordered 2 unit of PRBC, will add 1 more unit to make total of 3 unit PRBC as patient also has symptoms signs of CHF decompensation, remains of Hb~8-9 range.  Recheck Hb tonight, continue to transfuse for hemodynamic instability and target Hb level.  -CTA not showing any active bleeding lesions for IR intervention, on PPI drip.  N.p.o. now, GI plans for EGD tomorrow. General surgeon also on board. -Other supportive care, will give 1 unit of FFP, and calcium gluconate x1.  No history of cardiology cirrhosis, no indication for Sandostatin.  As patient also have symptoms signs of CHF decompensation but [place] patient responding to Lasix, ask nephrology to see patient tonight during or after PRBCs for HD. D/W Dr. Jonnie Finner.  Cardiac wheezing, acute on chronic systolic CHF, ejection -Probably mild volume overload, expect this will get worse after/during PRBCs and FFPs transfusion, asked nephrology to see patient emergently tonight for HD.  Acute hypoxic respite failure -Secondary to acute CHF decompensation, management as above.  Coagulopathy -INR elevated on last 2 admissions, today's INR pending, gave 1 dose of FFP's and 1 dose of IM  vitamin K. -Etiology may related to reported hemochromatosis?  On recent CT scans.  But no confirmed cirrhosis diagnose.   Question of hemochromatosis -Outpatient follow-up with hematology  History of pericardial effusion -Small amount on recent echo, consider recheck echo if after medically stable still having hypoxia  HTN -Hold all BP meds.  ESRD on HD -As above.   DVT prophylaxis: SCD Code Status: Full code Family Communication: ED talked to patient's wife Disposition Plan: Patient is sick with severe ongoing GI bleed and symptomatic CHF decompensation, expect more than 2 midnight hospital for complex GI intervention. Consults called: GI, general surgery, nephrology. Admission status: PCU   Lequita Halt MD [facility] Pager [phone]  11/12/2021, 6:16 PM

## 2021-11-12 NOTE — Progress Notes (Signed)
While present in ED waiting area Chaplain encountered pt's wife. Chaplain procured approval for wife to visit husband.  Chaplain escorted wife to pt's bedside.  [place]

## 2021-11-12 NOTE — ED Triage Notes (Signed)
Arrived via EMS; reported found on the floor;rectal bleeding; approx 400 ml blood loss per EMS; reported patient is also a dialysis patient and had a dull session today. Per EMS patient is slow to response; stated prior hx of bleeding ulcers and has had + blood on vomiting also.

## 2021-11-12 NOTE — Consult Note (Signed)
Referring Provider: Dr. Darl Householder, [place] Primary Care Physician:  [name], MD Primary Gastroenterologist:  Althia Forts  Reason for Consultation:  GI bleed  HPI: Jackob [place] is a 61 y.o. male with past medical history of end-stage renal disease on dialysis Tuesday/Thursday/Saturday, anemia of chronic disease related to his kidney disease, remote history of colon cancer, hypertension, polysubstance abuse.  He was just admitted 11/05/2021 with syncopal episode after large hematemesis and large bloody stools.  Emergently taken for EGD by Dr. Benson Norway found large duodenal bleeding ulcer status post chemo spray and then went to IR for GDA embolization.  Now presenting again with similar story.  Passed large maroon stool on the commode and passed out.  Brought in by EMS.  Hgb 5.1 grams.  He is receiving 1 unit of packed red blood cells emergently and 2 others have been ordered.  PPI drip has been ordered.  CTA has been ordered.  He is very sleepy, easy to arouse, but does not stay awake long before falling asleep again.  Per ED nurse, he denied abdominal pain.  Nurse said that when he arrived he had a large maroon clot from his bottom, but no other "stool" per se.  Hpylori serology negative 11/07/2021.  Past Medical History:  Diagnosis Date   Anemia of chronic kidney failure    BPH (benign prostatic hyperplasia)    Colon cancer ([place]) 2014   End stage renal disease on dialysis ([place]) 2017   Hypertension    Hypertensive heart disease with chronic diastolic congestive heart failure ([place]) 07/17/2016   Polysubstance abuse ([place])    History of heroin and marijuana use    Past Surgical History:  Procedure Laterality Date   ABDOMINAL SURGERY     AV FISTULA PLACEMENT Left 09/19/2016   Procedure: Left arm Radiocephalic ARTERIOVENOUS (AV) FISTULA CREATION;  Surgeon: Conrad Martin, MD;  Location: [facility];  Service: Vascular;  Laterality: Left;   [place] Left 07/09/2017   Procedure:  BRACHIOCEPHALIC FISTULA CREATION;  Surgeon: Conrad , MD;  Location: [facility];  Service: Vascular;  Laterality: Left;   COLON SURGERY  2014   ESOPHAGOGASTRODUODENOSCOPY (EGD) WITH PROPOFOL N/A 11/05/2021   Procedure: ESOPHAGOGASTRODUODENOSCOPY (EGD) WITH PROPOFOL;  Surgeon: Carol Ada, MD;  Location: [facility];  Service: Endoscopy;  Laterality: N/A;   HEMOSTASIS CONTROL  11/05/2021   Procedure: HEMOSTASIS CONTROL;  Surgeon: Carol Ada, MD;  Location: [facility];  Service: Endoscopy;;   INSERTION OF DIALYSIS CATHETER N/A 09/19/2016   Procedure: INSERTION OF TUNNELED DIALYSIS CATHETER;  Surgeon: Conrad , MD;  Location: [facility];  Service: Vascular;  Laterality: N/A;   IR ANGIOGRAM VISCERAL SELECTIVE  11/05/2021   IR EMBO ART  VEN HEMORR LYMPH EXTRAV  INC GUIDE ROADMAPPING  11/05/2021   IR GENERIC HISTORICAL  09/14/2016   IR US GUIDE VASC ACCESS RIGHT 09/14/2016 [name], DO [facility]   IR GENERIC HISTORICAL  09/14/2016   IR FLUORO GUIDE CV LINE RIGHT 09/14/2016 [name], DO [facility]   IR PARACENTESIS  06/22/2021   IR US GUIDE [place] RIGHT  11/05/2021   LAPAROTOMY N/A 05/23/2021   Procedure: EXPLORATORY LAPAROTOMY LYSIS ADHESIONS;  Surgeon: [name], MD;  Location: [facility];  Service: General;  Laterality: N/A;   ORIF TIBIA PLATEAU Left 01/15/2018   Procedure: OPEN REDUCTION INTERNAL FIXATION (ORIF) TIBIAL PLATEAU;  Surgeon: [place]e [place], MD;  Location: [facility];  Service: Orthopedics;  Laterality: Left;   SCLEROTHERAPY  11/05/2021   Procedure: SCLEROTHERAPY;  Surgeon: Carol Ada, MD;  Location: [facility] ENDOSCOPY;  Service: Endoscopy;;   TRANSTHORACIC ECHOCARDIOGRAM  07/2016    EF 60-65%, No RWMA. Mod Concentric LVH - Gr 2 DD. Severe LA dilation. PAP ~35 mmHg (mild Pulm HTN)  --> no changes noted 1 month later    Prior to Admission medications   Medication Sig Start Date End Date Taking? Authorizing Provider  carvedilol (COREG) 6.25 MG tablet Take 1 tablet (6.25  mg total) by mouth 2 (two) times daily with a meal. 10/09/21   Sharen Hones, MD  LIDOCAINE-PRILOCAINE EX Apply 1 application topically See admin instructions. Prior to dialysis Tuesday,Thursday and saturday    [provider]  Oxycodone HCl 10 MG TABS Take 10 mg by mouth 2 (two) times daily as needed (pain). 10/26/21   [provider]  pantoprazole (PROTONIX) 40 MG tablet Take 1 tablet (40 mg total) by mouth 2 (two) times daily. 11/09/21   Geradine Girt, DO  sevelamer carbonate (RENVELA) 800 MG tablet Take 1,600 mg by mouth with breakfast, with lunch, and with evening meal. 05/12/21   [provider]    Current Facility-Administered Medications  Medication Dose Route Frequency Provider Last Rate Last Admin   0.9 %  sodium chloride infusion  10 mL/hr Intravenous Once Drenda Freeze, MD       0.9 %  sodium chloride infusion  10 mL/hr Intravenous Once Drenda Freeze, MD       pantoprazole (PROTONIX) 80 mg /NS 100 mL IVPB  80 mg Intravenous Once Drenda Freeze, MD       pantoprozole (PROTONIX) 80 mg /NS 100 mL infusion  8 mg/hr Intravenous Continuous Drenda Freeze, MD       sodium chloride 0.9 % bolus 500 mL  500 mL Intravenous Once Drenda Freeze, MD       Current Outpatient Medications  Medication Sig Dispense Refill   carvedilol (COREG) 6.25 MG tablet Take 1 tablet (6.25 mg total) by mouth 2 (two) times daily with a meal. 60 tablet 0   LIDOCAINE-PRILOCAINE EX Apply 1 application topically See admin instructions. Prior to dialysis Tuesday,Thursday and saturday     Oxycodone HCl 10 MG TABS Take 10 mg by mouth 2 (two) times daily as needed (pain).     pantoprazole (PROTONIX) 40 MG tablet Take 1 tablet (40 mg total) by mouth 2 (two) times daily. 60 tablet 2   sevelamer carbonate (RENVELA) 800 MG tablet Take 1,600 mg by mouth with breakfast, with lunch, and with evening meal.      Allergies as of 11/12/2021   (No Known Allergies)    Family History   Problem Relation Age of Onset   Heart failure Mother        Died at age 80.   Heart attack Mother 2   Hypertension Mother    Diabetes Mellitus II Mother    Other Father        Unknown   Kidney failure Sister        (Oldest Sister)   Other Other        Multiple siblings have started her heart disease, he is not sure of the details.   CAD Nephew     Social History   Socioeconomic History   Marital status: Married    Spouse name: Not on file   Number of children: Not on file   Years of education: Not on file   Highest education level: Not on file  Occupational History  Occupation: disabled  Tobacco Use   Smoking status: Some Days    Packs/day: 0.25    Types: Cigarettes   Smokeless tobacco: Never  Vaping Use   Vaping Use: Never used  Substance and Sexual Activity   Alcohol use: No    Alcohol/week: 7.0 standard drinks    Types: 7 Cans of beer per week   Drug use: Yes    Frequency: 1.0 times per week    Types: Marijuana, Heroin    Comment: reports quitting heroin 15 years ago; can't afford daily marijuana   Sexual activity: Not on file    Comment: once a week  Other Topics Concern   Not on file  Social History Narrative   Not on file   Social Determinants of Health   Financial Resource Strain: Not on file  Food Insecurity: Not on file  Transportation Needs: Not on file  Physical Activity: Not on file  Stress: Not on file  Social Connections: Not on file  Intimate Partner Violence: Not on file    Review of Systems: ROS difficult to obtain due to patient sleepiness.  Physical Exam: Vital signs in last 24 hours: Temp:  [98.3 F (36.8 C)] 98.3 F (36.8 C) (01/28 1509) Pulse Rate:  [96] 96 (01/28 1509) Resp:  [22] 22 (01/28 1509) BP: (121)/(85) 121/85 (01/28 1509) SpO2:  [95 %] 95 % (01/28 1509)   General:  Chronically ill-appearing.  Very sleepy, easily aroused but does not stay awake for long. Head:  Normocephalic and atraumatic. Eyes:  Sclera  clear, no icterus.  Conjunctiva pink. Ears:  Normal auditory acuity. Mouth:  No deformity or lesions.   Lungs:  Some increased WOB noted and expiratory wheezing B/L as well. Heart:  Regular rate and rhythm; no murmurs, clicks, rubs, or gallops. Abdomen:  Soft, non-distended. BS present.  Non-tender. Rectal:  Deferred.  ED nurse reports a large maroon clot from his bottom upon arrival.  Msk:  Symmetrical without gross deformities. Pulses:  Normal pulses noted. Extremities:  Some swelling noted, brawny edema type appearance. Skin:  Intact without significant lesions or rashes.  Lab Results: Recent Labs    11/12/21 1501 11/12/21 1526  WBC 21.2*  --   HGB 5.1* 6.1*  HCT 15.7* 18.0*  PLT 284  --    BMET Recent Labs    11/12/21 1501 11/12/21 1526  NA 138 136  K 4.1 4.0  CL 98 96*  CO2 27  --   GLUCOSE 130* 122*  BUN 41* 40*  CREATININE 4.68* 4.70*  CALCIUM 7.7*  --    LFT Recent Labs    11/12/21 1501  PROT 5.6*  ALBUMIN 2.0*  AST 22  ALT 27  ALKPHOS 55  BILITOT 0.5    IMPRESSION:  60 year old male admitted 11/05/2021 with syncopal episode after large hematemesis and large bloody stools.  Emergently taken for EGD by Dr. Benson Norway found large duodenal bleeding ulcer status post chemo spray and then went to IR for GDA embolization.  Now presenting again with similar story.  Passed large maroon stool on the commode and passed out.  Hgb 5.1 grams.  Most likely source is the same duodenal ulcer.  Duodenal ulcer S/p EGD with hemospray with Dr. Benson Norway 01/21 and GDA embolization with IR.  Likely source of bleeding again.    Acute on chronic anemia:  Hgb 5.1 grams.  They have one unit of emergent release blood transfusing and then have two others ordered.   End-stage renal disease:  Says  that he went to dialysis this AM.   Colon cancer 2014   Polysubstance abuse  -Agree with CTA that has been ordered by the ED.  Pending results that will likely guide where he needs to go, ie,  back to IR, EGD, or surgery. -PPI gtt. -Transfusions and stabilization as is being done.   Laban Emperor. Felice Deem  11/12/2021, 3:38 PM

## 2021-11-12 NOTE — ED Notes (Signed)
Return from CT

## 2021-11-12 NOTE — ED Provider Notes (Signed)
[facility] EMERGENCY DEPARTMENT Provider Note   CSN: [id number] Arrival date & time: 11/12/21  1449     History  Chief Complaint  Patient presents with   Rectal Bleeding    Timothy [name] is a 61 y.o. male history of ESRD on dialysis, recent admission for duodenal ulcer with acute bleeding requiring embolization, here presenting with bright red blood per rectum.  Patient was on the commode had about 400 cc of peritoneal per rectum.  Patient felt lightheaded and dizzy at that time.  Patient appears pale on arrival.  Patient is not on blood thinners currently  The history is provided by the patient.      Home Medications Prior to Admission medications   Medication Sig Start Date End Date Taking? Authorizing Provider  carvedilol (COREG) 6.25 MG tablet Take 1 tablet (6.25 mg total) by mouth 2 (two) times daily with a meal. 10/09/21   Sharen Hones, MD  LIDOCAINE-PRILOCAINE EX Apply 1 application topically See admin instructions. Prior to dialysis Tuesday,Thursday and saturday    [provider]  Oxycodone HCl 10 MG TABS Take 10 mg by mouth 2 (two) times daily as needed (pain). 10/26/21   [provider]  pantoprazole (PROTONIX) 40 MG tablet Take 1 tablet (40 mg total) by mouth 2 (two) times daily. 11/09/21   Geradine Girt, DO  sevelamer carbonate (RENVELA) 800 MG tablet Take 1,600 mg by mouth with breakfast, with lunch, and with evening meal. 05/12/21   [provider]      Allergies    Patient has no known allergies.    Review of Systems   Review of Systems  Gastrointestinal:  Positive for blood in stool.  All other systems reviewed and are negative.  Physical Exam Updated Vital Signs BP 119/84    Pulse 89    Temp 98.1 F (36.7 C) (Oral)    Resp 18    SpO2 99%  Physical Exam Vitals and nursing note reviewed.  Constitutional:      Appearance: Normal appearance.  HENT:     Head: Normocephalic.     Nose: Nose normal.      Mouth/Throat:     Mouth: Mucous membranes are moist.  Eyes:     Comments: Conjunctivae is pale  Cardiovascular:     Rate and Rhythm: Normal rate and regular rhythm.     Pulses: Normal pulses.     Heart sounds: Normal heart sounds.  Pulmonary:     Effort: Pulmonary effort is normal.     Breath sounds: Normal breath sounds.  Abdominal:     General: Abdomen is flat.     Comments: Mildly distended  Genitourinary:    Comments: Rectal- bright red blood, no obvious hemorroids  Musculoskeletal:        General: Normal range of motion.     Cervical back: Normal range of motion and neck supple.  Skin:    General: Skin is warm.     Capillary Refill: Capillary refill takes less than 2 seconds.  Neurological:     General: No focal deficit present.     Mental Status: He is alert and oriented to person, place, and time.  Psychiatric:        Mood and Affect: Mood normal.        Behavior: Behavior normal.    ED Results / Procedures / Treatments   Labs (all labs ordered are listed, but only abnormal results are displayed) Labs Reviewed  COMPREHENSIVE METABOLIC PANEL -  Abnormal; Notable for the following components:      Result Value   Glucose, Bld 130 (*)    BUN 41 (*)    Creatinine, Ser 4.68 (*)    Calcium 7.7 (*)    Total Protein 5.6 (*)    Albumin 2.0 (*)    GFR, Estimated 14 (*)    All other components within normal limits  CBC - Abnormal; Notable for the following components:   WBC 21.2 (*)    RBC 1.78 (*)    Hemoglobin 5.1 (*)    HCT 15.7 (*)    RDW 20.8 (*)    All other components within normal limits  POC OCCULT BLOOD, ED - Abnormal; Notable for the following components:   Fecal Occult Bld POSITIVE (*)    All other components within normal limits  I-STAT CHEM 8, ED - Abnormal; Notable for the following components:   Chloride 96 (*)    BUN 40 (*)    Creatinine, Ser 4.70 (*)    Glucose, Bld 122 (*)    Calcium, Ion 0.93 (*)    Hemoglobin 6.1 (*)    HCT 18.0 (*)    All  other components within normal limits  RESP PANEL BY RT-PCR (FLU A&B, COVID) ARPGX2  TYPE AND SCREEN  PREPARE RBC (CROSSMATCH)  PREPARE RBC (CROSSMATCH)    EKG None  Radiology CT Angio Abd/Pel W and/or Wo Contrast  Result Date: 11/12/2021 CLINICAL DATA:  via EMS; reported found on the floor;rectal bleeding; approx 400 ml blood loss per EMS; reported patient is also a dialysis patient and had a dull session today. Per EMS patient is slow to response; stated prior hx of bleeding ulcers and has had + blood on vomiting also. EXAM: CTA ABDOMEN AND PELVIS WITHOUT AND WITH CONTRAST TECHNIQUE: Multidetector CT imaging of the abdomen and pelvis was performed using the standard protocol during bolus administration of intravenous contrast. Multiplanar reconstructed images and MIPs were obtained and reviewed to evaluate the vascular anatomy. RADIATION DOSE REDUCTION: This exam was performed according to the departmental dose-optimization program which includes automated exposure control, adjustment of the mA and/or kV according to patient size and/or use of iterative reconstruction technique. CONTRAST:  169mL OMNIPAQUE IOHEXOL 350 MG/ML SOLN COMPARISON:  CT, 11/05/2021. FINDINGS: VASCULAR Aorta: Normal in caliber. Diffuse atherosclerosis but no significant stenosis. No dissection. Celiac: Mild atherosclerosis at the origin. No significant stenosis. There are micro embolization coils along the gastric duodenal artery, new since the prior CT. No flow seen in the gastric duodenal artery. SMA: Patent without evidence of aneurysm, dissection, vasculitis or significant stenosis. Renals: Both renal arteries are patent without evidence of aneurysm, dissection, vasculitis, fibromuscular dysplasia or significant stenosis. IMA: Small I IMA, not well-defined. Inflow: Common and external iliac artery atherosclerosis with no significant stenosis. Proximal Outflow: Common femoral artery atherosclerosis with no significant  stenosis. Patent proximal femoral arteries and deep femoral arteries. Veins: No obvious venous abnormality within the limitations of this arterial phase study. Review of the MIP images confirms the above findings. NON-VASCULAR Lower chest: Focal area of opacity in the dependent right lower lobe, new. This is peribronchovascular and may be infectious or inflammatory but is nonspecific. Hepatobiliary: Unremarkable liver. Gallbladder mostly contracted. No convincing bile duct dilation. Pancreas: Duct is dilated up to 8 mm, measuring larger than on the recent prior CT. No pancreatic mass or convincing inflammation. Spleen: Low-density lesion over the dome of the spleen, 1.5 cm in size. This is consistent with a cyst and  is unchanged. No other splenic abnormality. Adrenals/Urinary Tract: No adrenal masses. Marked bilateral renal atrophy with multiple renal masses. The largest of these arises from the anterior right kidney midpole, has an enhancing rim and is relative increased attenuation compared to other low-density masses. This is consistent with a chronic hemorrhagic cyst, and was previously evaluated with MRI on 06/21/2021. No renal stones. No hydronephrosis. Ureters not well visualized, but no evidence of ureteral dilation. Bladder is decompressed. Stomach/Bowel: Stomach is unremarkable. Small bowel and colon are normal in overall caliber. Right mid abdomen colonic anastomosis staples consistent with an ileocolic anastomosis. Multiple additional vascular clips in the right lower quadrant and right mid abdomen. Small bowel and colon are not well-defined due to adjacent ascites, limiting contrast. No convincing bowel inflammation. There is no extravasation of contrast to indicate an active GI bleeding source. Lymphatic: No enlarged lymph nodes. Reproductive: Unremarkable. Other: Moderate ascites similar to the prior CT. Musculoskeletal: No fracture or acute finding.  No bone lesion. IMPRESSION: VASCULAR 1. No  evidence of an active GI bleeding source. 2. No enhancement/flow in the embolized gastro duodenal artery. 3. Aortic atherosclerosis. No aneurysm, dissection or significant stenosis. 4. Small irregular inferior mesenteric artery. Remaining aortic branch vessels are widely patent. NON-VASCULAR 1. No convincing acute findings. 2. Pancreatic duct is more dilated than on the prior CT, up to 8 mm, of unclear etiology. 3. Findings of end-stage renal disease. 4. Moderate ascites similar to the prior CT. 5. Stable changes from previous bowel surgery. Electronically Signed   By: Lajean Manes M.D.   On: 11/12/2021 17:05    Procedures Procedures  CRITICAL CARE Performed by: Wandra Arthurs   Total critical care time: 30 minutes  Critical care time was exclusive of separately billable procedures and treating other patients.  Critical care was necessary to treat or prevent imminent or life-threatening deterioration.  Critical care was time spent personally by me on the following activities: development of treatment plan with patient and/or surrogate as well as nursing, discussions with consultants, evaluation of patient's response to treatment, examination of patient, obtaining history from patient or surrogate, ordering and performing treatments and interventions, ordering and review of laboratory studies, ordering and review of radiographic studies, pulse oximetry and re-evaluation of patient's condition.  Angiocath insertion Performed by: Wandra Arthurs  Consent: Verbal consent obtained. Risks and benefits: risks, benefits and alternatives were discussed Time out: Immediately prior to procedure a "time out" was called to verify the correct patient, procedure, equipment, support staff and site/side marked as required.  Preparation: Patient was prepped and draped in the usual sterile fashion.  Vein Location: R antecube   Ultrasound Guided  Gauge: 20 long   Normal blood return and flush without  difficulty Patient tolerance: Patient tolerated the procedure well with no immediate complications.    Medications Ordered in ED Medications  pantoprozole (PROTONIX) 80 mg /NS 100 mL infusion (8 mg/hr Intravenous New Bag/Given 11/12/21 1638)  0.9 %  sodium chloride infusion (has no administration in time range)  sodium chloride 0.9 % bolus 500 mL (has no administration in time range)  0.9 %  sodium chloride infusion (has no administration in time range)  pantoprazole (PROTONIX) 80 mg /NS 100 mL IVPB (0 mg Intravenous Stopped 11/12/21 1636)  iohexol (OMNIPAQUE) 350 MG/ML injection 100 mL (100 mLs Intravenous Contrast Given 11/12/21 1608)  iohexol (OMNIPAQUE) 350 MG/ML injection 100 mL (100 mLs Intravenous Contrast Given 11/12/21 1623)    ED Course/ Medical Decision Making/ A&P  Medical Decision Making Timothy [name] is a 61 y.o. male history of bleeding duodenal ulcer here presenting with bright red blood per rectum.  Patient has prior blood per rectum about 400 cc prior to arrival.  Patient appears very pale.  Patient is not hypotensive or tachycardic currently.  He recently had a duodenal ulcer that required embolization.  Since he is a dialysis patient and does not urinate, will get stat CTA abdomen pelvis  3:30 pm Patient's hemoglobin is 5.1.  Ordered 1 unit of emergent blood.  I also ordered 2 more units of PRBC.  Patient also received Protonix bolus and drip.  I placed another IV as well.  I also consulted [facility] to see patient.   5:12 PM CTA did not show any active extravasation.  Patient remains hemodynamically stable.  Dr. Tarri Glenn from GI saw patient.  She recommend PPI and also recommend surgery evaluation given persistent bleeding.  They plan to do an EGD tomorrow morning.  If he has more bleeding, he may need gastrectomy. I discussed case with Dr. Kae Heller from surgery, who will consult on patient. Will admit to stepdown    6:04 PM Patient admitted to  hospitalist service   Problems Addressed: ESRD (end stage renal disease) on dialysis [facility]): chronic illness or injury Rectal bleeding: acute illness or injury  Amount and/or Complexity of Data Reviewed Independent Historian: EMS External Data Reviewed: labs, radiology and notes. Labs: ordered. Decision-making details documented in ED Course. Radiology: ordered and independent interpretation performed. Decision-making details documented in ED Course. Discussion of management or test interpretation with external provider(s): Dr. Tarri Glenn from GI, Dr. Kae Heller from general surgery   Risk Prescription drug management. Decision regarding hospitalization. Emergency major surgery.  Final Clinical Impression(s) / ED Diagnoses Final diagnoses:  Rectal bleeding    Rx / DC Orders ED Discharge Orders     None         Drenda Freeze, MD 11/12/21 [phone]

## 2021-11-12 NOTE — ED Notes (Signed)
Verbal consent for ERB obtained

## 2021-11-12 NOTE — ED Notes (Signed)
Patient transported to CT 

## 2021-11-12 NOTE — Consult Note (Signed)
Renal Service Consult Note [facility]  Timothy [name] 11/12/2021 [name], MD Requesting Physician: Dr. Darl Householder  Reason for Consult: ESRD pt w/ rectal bleeding HPI: The patient is a 61 y.o. year-old w/ hx of ESRD on HD, HFrEF w/ EF 40-45%, anemia, HTN, BPH and recent admit for GIB and very large duodenal ulcer w/ visible vessel seen by EGD which required hemospray then subsequent IR embolization to halt the bleeding.  Pt presents to ED today w/ recurrent rectal bleeding and Hb of 5.1. Seen by GI who ordered at CTA of abdomen/ pelvis which did not show any active bleeding.  Plan is for IV PPI , with plan for EGD after Hb resuscitation. We are asked to see for ESRD.    Pt seen in ED.  Pt states he wants something to drink. Poor historian otherwise.  Has rec'd 1u prbc's so far I believe.   ROS - denies CP, no joint pain, no HA, no blurry vision, no rash, no diarrhea, no nausea/ vomiting, no dysuria, no difficulty voiding   Past Medical History  Past Medical History:  Diagnosis Date   Anemia of chronic kidney failure    BPH (benign prostatic hyperplasia)    Colon cancer ([place]) 2014   End stage renal disease on dialysis ([place]) 2017   Hypertension    Hypertensive heart disease with chronic diastolic congestive heart failure ([place]) 07/17/2016   Polysubstance abuse ([place])    History of heroin and marijuana use   Past Surgical History  Past Surgical History:  Procedure Laterality Date   ABDOMINAL SURGERY     AV FISTULA PLACEMENT Left 09/19/2016   Procedure: Left arm Radiocephalic ARTERIOVENOUS (AV) FISTULA CREATION;  Surgeon: Conrad Tilden, MD;  Location: [place];  Service: Vascular;  Laterality: Left;   [place] Left 07/09/2017   Procedure: BRACHIOCEPHALIC FISTULA CREATION;  Surgeon: Conrad Mountainside, MD;  Location: [place];  Service: Vascular;  Laterality: Left;   COLON SURGERY  2014   ESOPHAGOGASTRODUODENOSCOPY (EGD) WITH PROPOFOL N/A 11/05/2021   Procedure:  ESOPHAGOGASTRODUODENOSCOPY (EGD) WITH PROPOFOL;  Surgeon: Carol Ada, MD;  Location: [place];  Service: Endoscopy;  Laterality: N/A;   HEMOSTASIS CONTROL  11/05/2021   Procedure: HEMOSTASIS CONTROL;  Surgeon: Carol Ada, MD;  Location: [place];  Service: Endoscopy;;   INSERTION OF DIALYSIS CATHETER N/A 09/19/2016   Procedure: INSERTION OF TUNNELED DIALYSIS CATHETER;  Surgeon: Conrad Lequire, MD;  Location: [place];  Service: Vascular;  Laterality: N/A;   IR ANGIOGRAM VISCERAL SELECTIVE  11/05/2021   IR EMBO ART  VEN HEMORR LYMPH EXTRAV  INC GUIDE ROADMAPPING  11/05/2021   IR GENERIC HISTORICAL  09/14/2016   IR US GUIDE VASC ACCESS RIGHT 09/14/2016 [name], DO [facility]   IR GENERIC HISTORICAL  09/14/2016   IR FLUORO GUIDE CV LINE RIGHT 09/14/2016 [name], DO [facility]   IR PARACENTESIS  06/22/2021   IR US GUIDE [place] RIGHT  11/05/2021   LAPAROTOMY N/A 05/23/2021   Procedure: EXPLORATORY LAPAROTOMY LYSIS ADHESIONS;  Surgeon: [name], MD;  Location: [place];  Service: General;  Laterality: N/A;   ORIF TIBIA PLATEAU Left 01/15/2018   Procedure: OPEN REDUCTION INTERNAL FIXATION (ORIF) TIBIAL PLATEAU;  Surgeon: Altamese Yamhill, MD;  Location: [place];  Service: Orthopedics;  Laterality: Left;   SCLEROTHERAPY  11/05/2021   Procedure: Clide Deutscher;  Surgeon: Carol Ada, MD;  Location: [facility] ENDOSCOPY;  Service: Endoscopy;;   TRANSTHORACIC ECHOCARDIOGRAM  07/2016    EF  60-65%, No RWMA. Mod Concentric LVH - Gr 2 DD. Severe LA dilation. PAP ~35 mmHg (mild Pulm HTN)  --> no changes noted 1 month later   Family History  Family History  Problem Relation Age of Onset   Heart failure Mother        Died at age 24.   Heart attack Mother 108   Hypertension Mother    Diabetes Mellitus II Mother    Other Father        Unknown   Kidney failure Sister        (Oldest Sister)   Other Other        Multiple siblings have started her heart disease, he is not sure of the details.    CAD Nephew    Social History  reports that he has been smoking cigarettes. He has been smoking an average of .25 packs per day. He has never used smokeless tobacco. He reports current drug use. Frequency: 1.00 time per week. Drugs: Marijuana and Heroin. He reports that he does not drink alcohol. Allergies No Known Allergies Home medications Prior to Admission medications   Medication Sig Start Date End Date Taking? Authorizing Provider  carvedilol (COREG) 6.25 MG tablet Take 1 tablet (6.25 mg total) by mouth 2 (two) times daily with a meal. 10/09/21   Sharen Hones, MD  LIDOCAINE-PRILOCAINE EX Apply 1 application topically See admin instructions. Prior to dialysis Tuesday,Thursday and saturday    [provider]  Oxycodone HCl 10 MG TABS Take 10 mg by mouth 2 (two) times daily as needed (pain). 10/26/21   [provider]  pantoprazole (PROTONIX) 40 MG tablet Take 1 tablet (40 mg total) by mouth 2 (two) times daily. 11/09/21   Geradine Girt, DO  sevelamer carbonate (RENVELA) 800 MG tablet Take 1,600 mg by mouth with breakfast, with lunch, and with evening meal. 05/12/21   [provider]     Vitals:   11/12/21 1700 11/12/21 1730 11/12/21 1745 11/12/21 1800  BP: 119/84 136/83 123/89 122/64  Pulse: 89 89 88 87  Resp: 18 18 18 18   Temp:      TempSrc:      SpO2: 99% 98% 98% 100%   Exam Gen alert, no distress, chronic wet cough, chronically ill appearing No rash, cyanosis or gangrene Sclera anicteric, throat clear  No jvd or bruits Chest clear bilat to bases, no rales/ wheezing RRR no MRG Abd soft ntnd no mass or ascites +bs GU normal MS no joint effusions or deformity Ext 2+ L and 1+ R pretib edema, no other edema, no wounds or ulcers Neuro is alert, Ox 3 , nf  LUA AVF +bruit      Home meds include - coreg 6.25 bid, oxy IR prn, protonix, renvela 2 ac tid     CTA abd/ pelvis > IMPRESSION/ VASCULAR:  No evidence of an active GI bleeding source.  No  enhancement/flow in the embolized gastro duodenal artery.  Aortic atherosclerosis. No aneurysm, dissection or significant stenosis. Small irregular inferior mesenteric artery. Remaining aortic branch vessels are widely patent.  NON-VASCULAR: No convincing acute findings. Pancreatic duct is more dilated than on the prior CT, up to 8 mm, of unclear etiology. Findings of end-stage renal disease. Moderate ascites similar to the prior CT. Stable changes from previous bowel surgery.         Na 136  K 4.0  CO2 27  BUN 40  Cr 4.70  Ca 7.7  Alb 2.0  LFT's wnl  Hb 5.1  wBC 21K      +FOB     OP HD: [facility]    From Nov 07, 2121 needs updating > 3h 61min 450/1.5  LUA AVF  Hep none     Assessment/ Plan: Rectal bleeding - w/ recent admit for large DU w/ visible vessel rx'd w/ hemospray, then IR did GDA embolization w/ good success.  Now here w/ Hb 5.1 and rectal bleeding. GI evaluating.   ESRD - on HD TTS.  Labs are good and no vol ^ on exam.  Will hold off on HD for now. Probably will do on Monday.  Get labs in am.  Anemia ckd - Hb low w/ GIB. 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 Resume renvela when eating. Get records in am. HTN - cont home meds, mild vol excess on exam. No resp issues.        Kelly Splinter  MD 11/12/2021, 6:12 PM  Recent Labs  Lab 11/09/21 0434 11/12/21 1501 11/12/21 1526  WBC 11.7* 21.2*  --   HGB 7.9* 5.1* 6.1*   Recent Labs  Lab 11/08/21 0421 11/09/21 0434 11/12/21 1501 11/12/21 1526  K 5.3* 4.6 4.1 4.0  BUN 154* 89* 41* 40*  CREATININE 8.55* 6.52* 4.68* 4.70*  ALBUMIN 2.5* 2.4* 2.0*  --   CALCIUM 7.7* 8.1* 7.7*  --   PHOS 6.4* 6.5*  --   --

## 2021-11-13 ENCOUNTER — Inpatient Hospital Stay (HOSPITAL_COMMUNITY): Payer: Medicare (Managed Care)

## 2021-11-13 ENCOUNTER — Encounter (HOSPITAL_COMMUNITY): Payer: Self-pay | Admitting: Internal Medicine

## 2021-11-13 DIAGNOSIS — K922 Gastrointestinal hemorrhage, unspecified: Secondary | ICD-10-CM

## 2021-11-13 LAB — HEMOGLOBIN AND HEMATOCRIT, BLOOD
HCT: 18.3 % — ABNORMAL LOW (ref 39.0–52.0)
HCT: 22 % — ABNORMAL LOW (ref 39.0–52.0)
HCT: 21.3 % — ABNORMAL LOW (ref 39.0–52.0)
Hemoglobin: 6.1 g/dL — CL (ref 13.0–17.0)
Hemoglobin: 7.6 g/dL — ABNORMAL LOW (ref 13.0–17.0)
Hemoglobin: 7.3 g/dL — ABNORMAL LOW (ref 13.0–17.0)

## 2021-11-13 LAB — BASIC METABOLIC PANEL
Anion gap: 15 (ref 5–15)
BUN: 53 mg/dL — ABNORMAL HIGH (ref 6–20)
CO2: 25 mmol/L (ref 22–32)
Calcium: 7.7 mg/dL — ABNORMAL LOW (ref 8.9–10.3)
Chloride: 92 mmol/L — ABNORMAL LOW (ref 98–111)
Creatinine, Ser: 5.02 mg/dL — ABNORMAL HIGH (ref 0.61–1.24)
GFR, Estimated: 12 mL/min — ABNORMAL LOW (ref >60)
Glucose, Bld: 83 mg/dL (ref 70–99)
Potassium: 4.2 mmol/L (ref 3.5–5.1)
Sodium: 132 mmol/L — ABNORMAL LOW (ref 135–145)

## 2021-11-13 LAB — CBC
HCT: 18.3 % — ABNORMAL LOW (ref 39.0–52.0)
Hemoglobin: 6.1 g/dL — CL (ref 13.0–17.0)
MCH: 28.5 pg (ref 26.0–34.0)
MCHC: 33.3 g/dL (ref 30.0–36.0)
MCV: 85.5 fL (ref 80.0–100.0)
Platelets: 232 10*3/uL (ref 150–400)
RBC: 2.14 MIL/uL — ABNORMAL LOW (ref 4.22–5.81)
RDW: 17.4 % — ABNORMAL HIGH (ref 11.5–15.5)
WBC: 10.5 10*3/uL (ref 4.0–10.5)
nRBC: 0 % (ref 0.0–0.2)

## 2021-11-13 LAB — HEPATITIS B SURFACE ANTIGEN: Hepatitis B Surface Ag: NONREACTIVE

## 2021-11-13 LAB — PROTIME-INR
INR: 1.5 — ABNORMAL HIGH (ref 0.8–1.2)
Prothrombin Time: 18 seconds — ABNORMAL HIGH (ref 11.4–15.2)

## 2021-11-13 MED ORDER — LIDOCAINE HCL (PF) 1 % IJ SOLN: 5.0000 mL | INTRAMUSCULAR | Status: DC | PRN

## 2021-11-13 MED ORDER — PENTAFLUOROPROP-TETRAFLUOROETH EX AERO: 1.0000 "application " | INHALATION_SPRAY | CUTANEOUS | Status: DC | PRN

## 2021-11-13 MED ORDER — LIDOCAINE-PRILOCAINE 2.5-2.5 % EX CREA
1.0000 "application " | TOPICAL_CREAM | CUTANEOUS | Status: DC | PRN
  Filled 2021-11-13: qty 5

## 2021-11-13 MED ORDER — CHLORHEXIDINE GLUCONATE CLOTH 2 % EX PADS
6.0000 | MEDICATED_PAD | Freq: Every day | CUTANEOUS | Status: DC
  Administered 2021-11-13 – 2021-11-15 (×3): 6 via TOPICAL

## 2021-11-13 MED ORDER — SODIUM CHLORIDE 0.9 % IV SOLN: 100.0000 mL | INTRAVENOUS | Status: DC | PRN

## 2021-11-13 MED ORDER — IPRATROPIUM-ALBUTEROL 0.5-2.5 (3) MG/3ML IN SOLN
3.0000 mL | Freq: Four times a day (QID) | RESPIRATORY_TRACT | Status: DC
  Administered 2021-11-13: 3 mL via RESPIRATORY_TRACT
  Filled 2021-11-13: qty 3

## 2021-11-13 MED ORDER — DOXERCALCIFEROL 4 MCG/2ML IV SOLN
2.0000 ug | INTRAVENOUS | Status: DC
  Administered 2021-11-14 – 2021-12-01 (×6): 2 ug via INTRAVENOUS
  Filled 2021-11-13 (×14): qty 2

## 2021-11-13 MED ORDER — LIDOCAINE HCL (PF) 1 % IJ SOLN
5.0000 mL | INTRAMUSCULAR | Status: DC | PRN
  Filled 2021-11-13: qty 5

## 2021-11-13 MED ORDER — SODIUM CHLORIDE 0.9 % IV SOLN: INTRAVENOUS | Status: DC

## 2021-11-13 MED ORDER — LIDOCAINE-PRILOCAINE 2.5-2.5 % EX CREA: 1.0000 "application " | TOPICAL_CREAM | CUTANEOUS | Status: DC | PRN

## 2021-11-13 NOTE — Progress Notes (Addendum)
[name] Progress Note   Subjective: Called regarding C/O from NURSE that patient is SOB with crackles and wheezing. Seen in room: Patient is on RA, O2 Sats 99% lungs slightly decreased in bases with upper airway rhonchi which clear with cough. He adamantly denies SOB. Says he is starving, no food this AM but has diet order. Given crackers and patient is happy now however Dr. Tarri Glenn now at bedside. For endo in AM. Only clear liquids or NPO going forward. HGB 6.1.   Has rec'd 2 units PRBCs, 3rd unit transfusing. Also has rec'd  FFP. Will have  HD tonight for volume control.   HAD HD 11/12/2021 at [facility] but signed off after 2 hours 33 minutes. High gain, left 8.4 kgs above EDW.     Objective Vitals:   11/13/21 0500 11/13/21 0604 11/13/21 0832 11/13/21 0853  BP: 122/68 118/70 118/84 130/77  Pulse: 77 70 75 73  Resp: 15 20 15  (!) 26  Temp: 98.6 F (37 C) 98.9 F (37.2 C) 98 F (36.7 C) (!) 97.5 F (36.4 C)  TempSrc: Oral Oral Oral Oral  SpO2: 95% 95% 99% 100%  Height:       Physical Exam General: Chronically ill appearing male in NAD Heart: S1,S2 RRR SR HR 75 Lungs: Bilateral breath sounds, slightly decreased in bases. Few scattered rhonchi in bronchial area, clears with cough.  Abdomen: NABS, NT, ND Extremities: No LE edema.  Dialysis Access: L AVF + T/B   Additional Objective Labs: Basic Metabolic Panel: Recent Labs  Lab 11/07/21 0859 11/08/21 0421 11/09/21 0434 11/12/21 1501 11/12/21 1526 11/13/21 0113  NA 132* 129* 128* 138 136 132*  K 4.6 5.3* 4.6 4.1 4.0 4.2  CL 94* 91* 90* 98 96* 92*  CO2 19* 17* 21* 27  --  25  GLUCOSE 124* 90 93 130* 122* 83  BUN 151* 154* 89* 41* 40* 53*  CREATININE 8.25* 8.55* 6.52* 4.68* 4.70* 5.02*  CALCIUM 7.8* 7.7* 8.1* 7.7*  --  7.7*  PHOS 5.3* 6.4* 6.5*  --   --   --    Liver Function Tests: Recent Labs  Lab 11/07/21 1207 11/08/21 0421 11/09/21 0434 11/12/21 1501  AST 48*  --   --  22  ALT 66*  --    --  27  ALKPHOS 78  --   --  55  BILITOT 1.1  --   --  0.5  PROT 6.5  --   --  5.6*  ALBUMIN 2.4* 2.5* 2.4* 2.0*   No results for input(s): LIPASE, AMYLASE in the last 168 hours. CBC: Recent Labs  Lab 11/07/21 0859 11/08/21 0421 11/09/21 0434 11/12/21 1501 11/12/21 1526 11/13/21 0113 11/13/21 0645  WBC 14.8* 14.8* 11.7* 21.2*  --  10.5  --   NEUTROABS 11.0* 10.8* 7.8*  --   --   --   --   HGB 7.9* 8.1* 7.9* 5.1* 6.1* 6.1* 6.1*  HCT 23.4* 23.9* 23.4* 15.7* 18.0* 18.3* 18.3*  MCV 83.0 83.0 84.2 88.2  --  85.5  --   PLT 236 235 229 284  --  232  --    Blood Culture    Component Value Date/Time   SDES PERITONEAL FLUID 06/22/2021 1236   SPECREQUEST ABDOMEN 06/22/2021 1236   CULT  06/22/2021 1236    NO GROWTH 3 DAYS Performed at [facility], [place] [address], [place], [place] [address]    REPTSTATUS 06/26/2021 FINAL 06/22/2021 1236  Cardiac Enzymes: No results for input(s): CKTOTAL, CKMB, CKMBINDEX, TROPONINI in the last 168 hours. CBG: Recent Labs  Lab 11/06/21 1159 11/06/21 1515 11/06/21 1923  GLUCAP 88 125* 130*   Iron Studies: No results for input(s): IRON, TIBC, TRANSFERRIN, FERRITIN in the last 72 hours. @lablastinr3 @ Studies/Results: DG CHEST PORT 1 VIEW  Result Date: 11/13/2021 CLINICAL DATA:  Hypoxia. EXAM: PORTABLE CHEST 1 VIEW COMPARISON:  11/08/2021 and older exams. FINDINGS: Cardiac silhouette is mildly enlarged. No mediastinal or hilar masses. There are linear opacities in both lung bases and in the mid right lung. Hazy opacities noted in the left lower lung, behind the cardiac silhouette. Remainder of the lungs is clear. No pneumothorax. Skeletal structures are grossly intact. IMPRESSION: 1. Findings without convincing change from the most recent prior study allowing for differences in patient positioning and technique. 2. Linear opacities in the lung bases and right mid lung are consistent with atelectasis. Additional hazy opacity in the left lung  base is also likely due to atelectasis, possibly with layering pleural fluid. Infection at the left lung base should be considered in the proper clinical setting. 3. No evidence of pulmonary edema. Electronically Signed   By: Lajean Manes M.D.   On: 11/13/2021 10:13   CT Angio Abd/Pel W and/or Wo Contrast  Result Date: 11/12/2021 CLINICAL DATA:  via EMS; reported found on the floor;rectal bleeding; approx 400 ml blood loss per EMS; reported patient is also a dialysis patient and had a dull session today. Per EMS patient is slow to response; stated prior hx of bleeding ulcers and has had + blood on vomiting also. EXAM: CTA ABDOMEN AND PELVIS WITHOUT AND WITH CONTRAST TECHNIQUE: Multidetector CT imaging of the abdomen and pelvis was performed using the standard protocol during bolus administration of intravenous contrast. Multiplanar reconstructed images and MIPs were obtained and reviewed to evaluate the vascular anatomy. RADIATION DOSE REDUCTION: This exam was performed according to the departmental dose-optimization program which includes automated exposure control, adjustment of the mA and/or kV according to patient size and/or use of iterative reconstruction technique. CONTRAST:  19mL OMNIPAQUE IOHEXOL 350 MG/ML SOLN COMPARISON:  CT, 11/05/2021. FINDINGS: VASCULAR Aorta: Normal in caliber. Diffuse atherosclerosis but no significant stenosis. No dissection. Celiac: Mild atherosclerosis at the origin. No significant stenosis. There are micro embolization coils along the gastric duodenal artery, new since the prior CT. No flow seen in the gastric duodenal artery. SMA: Patent without evidence of aneurysm, dissection, vasculitis or significant stenosis. Renals: Both renal arteries are patent without evidence of aneurysm, dissection, vasculitis, fibromuscular dysplasia or significant stenosis. IMA: Small I IMA, not well-defined. Inflow: Common and external iliac artery atherosclerosis with no significant  stenosis. Proximal Outflow: Common femoral artery atherosclerosis with no significant stenosis. Patent proximal femoral arteries and deep femoral arteries. Veins: No obvious venous abnormality within the limitations of this arterial phase study. Review of the MIP images confirms the above findings. NON-VASCULAR Lower chest: Focal area of opacity in the dependent right lower lobe, new. This is peribronchovascular and may be infectious or inflammatory but is nonspecific. Hepatobiliary: Unremarkable liver. Gallbladder mostly contracted. No convincing bile duct dilation. Pancreas: Duct is dilated up to 8 mm, measuring larger than on the recent prior CT. No pancreatic mass or convincing inflammation. Spleen: Low-density lesion over the dome of the spleen, 1.5 cm in size. This is consistent with a cyst and is unchanged. No other splenic abnormality. Adrenals/Urinary Tract: No adrenal masses. Marked bilateral renal atrophy with multiple renal masses. The largest  of these arises from the anterior right kidney midpole, has an enhancing rim and is relative increased attenuation compared to other low-density masses. This is consistent with a chronic hemorrhagic cyst, and was previously evaluated with MRI on 06/21/2021. No renal stones. No hydronephrosis. Ureters not well visualized, but no evidence of ureteral dilation. Bladder is decompressed. Stomach/Bowel: Stomach is unremarkable. Small bowel and colon are normal in overall caliber. Right mid abdomen colonic anastomosis staples consistent with an ileocolic anastomosis. Multiple additional vascular clips in the right lower quadrant and right mid abdomen. Small bowel and colon are not well-defined due to adjacent ascites, limiting contrast. No convincing bowel inflammation. There is no extravasation of contrast to indicate an active GI bleeding source. Lymphatic: No enlarged lymph nodes. Reproductive: Unremarkable. Other: Moderate ascites similar to the prior CT.  Musculoskeletal: No fracture or acute finding.  No bone lesion. IMPRESSION: VASCULAR 1. No evidence of an active GI bleeding source. 2. No enhancement/flow in the embolized gastro duodenal artery. 3. Aortic atherosclerosis. No aneurysm, dissection or significant stenosis. 4. Small irregular inferior mesenteric artery. Remaining aortic branch vessels are widely patent. NON-VASCULAR 1. No convincing acute findings. 2. Pancreatic duct is more dilated than on the prior CT, up to 8 mm, of unclear etiology. 3. Findings of end-stage renal disease. 4. Moderate ascites similar to the prior CT. 5. Stable changes from previous bowel surgery. Electronically Signed   By: Lajean Manes M.D.   On: 11/12/2021 17:05   Medications:  sodium chloride     sodium chloride     pantoprazole 8 mg/hr (11/12/21 1638)   sodium chloride      sodium chloride   Intravenous Once   ipratropium-albuterol  3 mL Nebulization Q6H   sevelamer carbonate  1,600 mg Oral TID with meals   HD orders: [place] T,Th,S 3:45 hrs 450/Autoflow 1.5 65 kg 2.0K/2.0 Ca UFP 4 l AVF No heparin -Hectorol 2 mcg IV TIW -Mircera 150 mcg IV q 2 weeks (last dose 11/12/2021 -Venofer 100 mg IV weekly  Assessment/ Plan: Rectal bleeding - w/ recent admit for large DU w/ visible vessel rx'd w/ hemospray, then IR did GDA embolization w/ good success.  Now here w/ Hb 5.1 and rectal bleeding. Has been transfused but HGB still 6.1 this AM. Going for endoscopy tomorrow AM.  ESRD - on HD TTS. Arrived at OP unit 01/28 10 kg above OP EDW. Signed off early, left with 8.5 kg on board! HD tonight and again 11/14/2021 post endoscopy for volume removal.  Anemia ckd - See # 1. Hb low w/ GIB. Has rec'd 3 units PRBCs, 1 unit FFB since admit. ESA at OP center 11/12/2021. 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 Resume renvela when eating. Get records in am. HTN - cont home meds, mild vol excess on exam. No resp issues.  Nutrition: albumin 2.0. PCM. Threatening to sign out AMA if he  doesn't get food. Given a few crackers now on clear liquids. Dr. Tarri Glenn attempted to educate patient regarding diet constraints with active GIB.    Shakoya Gilmore H. Ravleen Ries NP-C 11/13/2021, 10:39 AM  [employer] [phone]

## 2021-11-13 NOTE — Progress Notes (Signed)
* Surgery Date in Future *  Subjective: CC: No abdominal pain this am. Denies n/v. Wants to eat. No further bloody emesis or hematochezia.   GI planning endoscopy tomorrow Renal planning for HD today.   Objective: Vital signs in last 24 hours: Temp:  [97.5 F (36.4 C)-99 F (37.2 C)] 97.6 F (36.4 C) (01/29 1109) Pulse Rate:  [70-97] 80 (01/29 1109) Resp:  [15-26] 21 (01/29 1109) BP: (110-149)/(64-135) 140/88 (01/29 1109) SpO2:  [90 %-100 %] 97 % (01/29 1109) Last BM Date: 11/12/21  Intake/Output from previous day: 01/28 0701 - 01/29 0700 In: 1212.6 [I.V.:119.7; Blood:1042.9; IV Piggyback:50] Out: -  Intake/Output this shift: Total I/O In: 50 [P.O.:40; I.V.:10] Out: -   PE: Gen:  Alert, NAD, pleasant Card:  Reg rate Pulm:  Normal rate and effort  Abd: Soft, mild distended, NT, +BS. No peritonitis.   Lab Results:  Recent Labs    11/12/21 1501 11/12/21 1526 11/13/21 0113 11/13/21 0645  WBC 21.2*  --  10.5  --   HGB 5.1*   < > 6.1* 6.1*  HCT 15.7*   < > 18.3* 18.3*  PLT 284  --  232  --    < > = values in this interval not displayed.   BMET Recent Labs    11/12/21 1501 11/12/21 1526 11/13/21 0113  NA 138 136 132*  K 4.1 4.0 4.2  CL 98 96* 92*  CO2 27  --  25  GLUCOSE 130* 122* 83  BUN 41* 40* 53*  CREATININE 4.68* 4.70* 5.02*  CALCIUM 7.7*  --  7.7*   PT/INR Recent Labs    11/13/21 0113  LABPROT 18.0*  INR 1.5*   CMP     Component Value Date/Time   NA 132 (L) 11/13/2021 0113   K 4.2 11/13/2021 0113   CL 92 (L) 11/13/2021 0113   CO2 25 11/13/2021 0113   GLUCOSE 83 11/13/2021 0113   BUN 53 (H) 11/13/2021 0113   CREATININE 5.02 (H) 11/13/2021 0113   CREATININE 1.59 (H) 07/29/2015 0943   CALCIUM 7.7 (L) 11/13/2021 0113   CALCIUM 8.4 (L) 09/14/2016 1615   PROT 5.6 (L) 11/12/2021 1501   ALBUMIN 2.0 (L) 11/12/2021 1501   AST 22 11/12/2021 1501   ALT 27 11/12/2021 1501   ALKPHOS 55 11/12/2021 1501   BILITOT 0.5 11/12/2021 1501    GFRNONAA 12 (L) 11/13/2021 0113   GFRNONAA 48 (L) 07/29/2015 0943   GFRAA 7 (L) 05/18/2019 2220   GFRAA 56 (L) 07/29/2015 0943   Lipase     Component Value Date/Time   LIPASE 50 06/11/2021 1427    Studies/Results: DG CHEST PORT 1 VIEW  Result Date: 11/13/2021 CLINICAL DATA:  Hypoxia. EXAM: PORTABLE CHEST 1 VIEW COMPARISON:  11/08/2021 and older exams. FINDINGS: Cardiac silhouette is mildly enlarged. No mediastinal or hilar masses. There are linear opacities in both lung bases and in the mid right lung. Hazy opacities noted in the left lower lung, behind the cardiac silhouette. Remainder of the lungs is clear. No pneumothorax. Skeletal structures are grossly intact. IMPRESSION: 1. Findings without convincing change from the most recent prior study allowing for differences in patient positioning and technique. 2. Linear opacities in the lung bases and right mid lung are consistent with atelectasis. Additional hazy opacity in the left lung base is also likely due to atelectasis, possibly with layering pleural fluid. Infection at the left lung base should be considered in the proper clinical setting. 3. No evidence  of pulmonary edema. Electronically Signed   By: Lajean Manes M.D.   On: 11/13/2021 10:13   CT Angio Abd/Pel W and/or Wo Contrast  Result Date: 11/12/2021 CLINICAL DATA:  via EMS; reported found on the floor;rectal bleeding; approx 400 ml blood loss per EMS; reported patient is also a dialysis patient and had a dull session today. Per EMS patient is slow to response; stated prior hx of bleeding ulcers and has had + blood on vomiting also. EXAM: CTA ABDOMEN AND PELVIS WITHOUT AND WITH CONTRAST TECHNIQUE: Multidetector CT imaging of the abdomen and pelvis was performed using the standard protocol during bolus administration of intravenous contrast. Multiplanar reconstructed images and MIPs were obtained and reviewed to evaluate the vascular anatomy. RADIATION DOSE REDUCTION: This exam was  performed according to the departmental dose-optimization program which includes automated exposure control, adjustment of the mA and/or kV according to patient size and/or use of iterative reconstruction technique. CONTRAST:  152mL OMNIPAQUE IOHEXOL 350 MG/ML SOLN COMPARISON:  CT, 11/05/2021. FINDINGS: VASCULAR Aorta: Normal in caliber. Diffuse atherosclerosis but no significant stenosis. No dissection. Celiac: Mild atherosclerosis at the origin. No significant stenosis. There are micro embolization coils along the gastric duodenal artery, new since the prior CT. No flow seen in the gastric duodenal artery. SMA: Patent without evidence of aneurysm, dissection, vasculitis or significant stenosis. Renals: Both renal arteries are patent without evidence of aneurysm, dissection, vasculitis, fibromuscular dysplasia or significant stenosis. IMA: Small I IMA, not well-defined. Inflow: Common and external iliac artery atherosclerosis with no significant stenosis. Proximal Outflow: Common femoral artery atherosclerosis with no significant stenosis. Patent proximal femoral arteries and deep femoral arteries. Veins: No obvious venous abnormality within the limitations of this arterial phase study. Review of the MIP images confirms the above findings. NON-VASCULAR Lower chest: Focal area of opacity in the dependent right lower lobe, new. This is peribronchovascular and may be infectious or inflammatory but is nonspecific. Hepatobiliary: Unremarkable liver. Gallbladder mostly contracted. No convincing bile duct dilation. Pancreas: Duct is dilated up to 8 mm, measuring larger than on the recent prior CT. No pancreatic mass or convincing inflammation. Spleen: Low-density lesion over the dome of the spleen, 1.5 cm in size. This is consistent with a cyst and is unchanged. No other splenic abnormality. Adrenals/Urinary Tract: No adrenal masses. Marked bilateral renal atrophy with multiple renal masses. The largest of these arises  from the anterior right kidney midpole, has an enhancing rim and is relative increased attenuation compared to other low-density masses. This is consistent with a chronic hemorrhagic cyst, and was previously evaluated with MRI on 06/21/2021. No renal stones. No hydronephrosis. Ureters not well visualized, but no evidence of ureteral dilation. Bladder is decompressed. Stomach/Bowel: Stomach is unremarkable. Small bowel and colon are normal in overall caliber. Right mid abdomen colonic anastomosis staples consistent with an ileocolic anastomosis. Multiple additional vascular clips in the right lower quadrant and right mid abdomen. Small bowel and colon are not well-defined due to adjacent ascites, limiting contrast. No convincing bowel inflammation. There is no extravasation of contrast to indicate an active GI bleeding source. Lymphatic: No enlarged lymph nodes. Reproductive: Unremarkable. Other: Moderate ascites similar to the prior CT. Musculoskeletal: No fracture or acute finding.  No bone lesion. IMPRESSION: VASCULAR 1. No evidence of an active GI bleeding source. 2. No enhancement/flow in the embolized gastro duodenal artery. 3. Aortic atherosclerosis. No aneurysm, dissection or significant stenosis. 4. Small irregular inferior mesenteric artery. Remaining aortic branch vessels are widely patent. NON-VASCULAR 1. No convincing  acute findings. 2. Pancreatic duct is more dilated than on the prior CT, up to 8 mm, of unclear etiology. 3. Findings of end-stage renal disease. 4. Moderate ascites similar to the prior CT. 5. Stable changes from previous bowel surgery. Electronically Signed   By: Lajean Manes M.D.   On: 11/12/2021 17:05    Anti-infectives: Anti-infectives (From admission, onward)    None        Assessment/Plan Recurrent GI bleeding in the setting of known duodenal ulcer previously successfully treated 1/21 with endoscopy followed by GDA embolization -  Receiving blood transfusion for a  presenting hemoglobin of 5.1 to now 6.1. He appears significantly fluid overloaded and hemoglobin may be partially dilutional. Renal planning HD today. No further episodes of hematochezia or hematemesis since arriving here - Agree with current plans for endoscopy tomorrow - If he rebleeds would recommend attempted endoscopic treatment (at least to definitively localize) and repeat angiogram/possible embolization prior to considering surgery.  I do not think he would survive an operation in his current state.  FEN - On CLD VTE - SCDs, on hold ID - None  ESRD (HD T/Th/Sa) - Renal planning for HD today and again 1/20 after endoscopy  CHF (EF 40 to 45%) Multiple recent episodes of fluid overload-appears fluid overloaded now with a weight 10 kg higher than it was in December Recent episode of pneumonia and encephalopathy Large pericardial effusion Ascites Hypertension Anemia of chronic disease Polysubstance abuse, heroin History of colon cancer status post laparoscopic-assisted right colectomy 2014, history of small bowel obstruction status post open lysis of adhesions 2022 BPH  This care required moderate level of medical decision making.    LOS: 1 day    Jillyn Ledger , [facility] 11/13/2021, 11:28 AM Please see Amion for pager number during day hours 7:00am-4:30pm

## 2021-11-13 NOTE — H&P (View-Only) (Signed)
[facility] Progress Note  CC:  GI bleed  Assessment / Plan: Recurrent GI bleeding occurring after GDA embolization for a very large duodenal bulb ulcer with overlying clot 11/05/21. Admitted with a hemoglobin of 5.1, now 6.1. Surprisingly, BUN and creatinine are markedly improved compared to his hospitalization earlier this week.   CTA in the ED was negative for acute bleed.   He has had no further bleeding since his arrival and has been hemodynamically stable. In fact, he has not had a bowel movement.    He has significant comorbidities including ESRD with associated anemia of chronic kidney disease, HTN, HFrEF with an EF 40-45%, pericardial effusion on echo 12/22, history of colon cancer s/p lap colectomy 2014, history of SBO with LOA 2022, heroin abuse.   EGD recommended although therapeutic interventions may be very limited given the endoscopic findings on his EGD last week. Unfortunately, his hemoglobin is too low to proceed with monitored anesthesia care at this time, although some of this may be dilutional given his volume overload. Will need to coordinate timing of PRBCs transfusion with dialysis as he did not have a complete a full session of dialysis yesterday. Discussed with Dr. Nevada Crane, NP [facility], Dr. Jonnie Finner and the on-call anesthesia team, and our on-call endoscopy nurses to coordinate the timing of his endoscopic evaluation.    Plan: - Clear liquid diet today (no red liquids) - NPO at midnight - Serial hgb/hct with transfusions as indicated - IV PPI continuous infusion - EGD after correction of anemia and subsequent hemodialysis, tentatively scheduled for 11/14/21 - May need to consider angiogram based on endoscopy results - Consider repeat CTA +/- angio with significant GI bleeding that occurs prior to ability to perform endoscopy - Surgery following     Subjective: No GI complaints this morning. Notes he hasn't had any bleeding since he's been here and he  wants to eat. No family present. The nephrology team was present at the time of my evaluation.   Objective:  Vital signs in last 24 hours: Temp:  [97.5 F (36.4 C)-99 F (37.2 C)] 98.5 F (36.9 C) (01/29 1209) Pulse Rate:  [63-97] 63 (01/29 1209) Resp:  [15-26] 20 (01/29 1209) BP: (110-149)/(64-135) 137/87 (01/29 1209) SpO2:  [90 %-100 %] 100 % (01/29 1209) Last BM Date: 11/12/21 General:   Alert, chronically ill appearing, in NAD, no blood in the oropharynx, eating graham crackers Heart:  Regular rate and rhythm; no murmurs Pulm: Clear anteriorly; no wheezing Abdomen:  Soft. Thin, Nontender. Nondistended. Normal bowel sounds. No rebound or guarding. LAD: No inguinal or umbilical LAD Extremities:  Without edema. Neurologic:  Alert and  oriented x4;  grossly normal neurologically. Psych:  Alert and cooperative. Normal mood and affect.   Lab Results: Recent Labs    11/12/21 1501 11/12/21 1526 11/13/21 0113 11/13/21 0645  WBC 21.2*  --  10.5  --   HGB 5.1* 6.1* 6.1* 6.1*  HCT 15.7* 18.0* 18.3* 18.3*  PLT 284  --  232  --    BMET Recent Labs    11/12/21 1501 11/12/21 1526 11/13/21 0113  NA 138 136 132*  K 4.1 4.0 4.2  CL 98 96* 92*  CO2 27  --  25  GLUCOSE 130* 122* 83  BUN 41* 40* 53*  CREATININE 4.68* 4.70* 5.02*  CALCIUM 7.7*  --  7.7*   LFT Recent Labs    11/12/21 1501  PROT 5.6*  ALBUMIN 2.0*  AST 22  ALT 27  ALKPHOS 55  BILITOT 0.5   PT/INR Recent Labs    11/13/21 0113  LABPROT 18.0*  INR 1.5*     DG CHEST PORT 1 VIEW  Result Date: 11/13/2021 CLINICAL DATA:  Hypoxia. EXAM: PORTABLE CHEST 1 VIEW COMPARISON:  11/08/2021 and older exams. FINDINGS: Cardiac silhouette is mildly enlarged. No mediastinal or hilar masses. There are linear opacities in both lung bases and in the mid right lung. Hazy opacities noted in the left lower lung, behind the cardiac silhouette. Remainder of the lungs is clear. No pneumothorax. Skeletal structures are grossly  intact. IMPRESSION: 1. Findings without convincing change from the most recent prior study allowing for differences in patient positioning and technique. 2. Linear opacities in the lung bases and right mid lung are consistent with atelectasis. Additional hazy opacity in the left lung base is also likely due to atelectasis, possibly with layering pleural fluid. Infection at the left lung base should be considered in the proper clinical setting. 3. No evidence of pulmonary edema. Electronically Signed   By: Lajean Manes M.D.   On: 11/13/2021 10:13   CT Angio Abd/Pel W and/or Wo Contrast  Result Date: 11/12/2021 CLINICAL DATA:  via EMS; reported found on the floor;rectal bleeding; approx 400 ml blood loss per EMS; reported patient is also a dialysis patient and had a dull session today. Per EMS patient is slow to response; stated prior hx of bleeding ulcers and has had + blood on vomiting also. EXAM: CTA ABDOMEN AND PELVIS WITHOUT AND WITH CONTRAST TECHNIQUE: Multidetector CT imaging of the abdomen and pelvis was performed using the standard protocol during bolus administration of intravenous contrast. Multiplanar reconstructed images and MIPs were obtained and reviewed to evaluate the vascular anatomy. RADIATION DOSE REDUCTION: This exam was performed according to the departmental dose-optimization program which includes automated exposure control, adjustment of the mA and/or kV according to patient size and/or use of iterative reconstruction technique. CONTRAST:  179mL OMNIPAQUE IOHEXOL 350 MG/ML SOLN COMPARISON:  CT, 11/05/2021. FINDINGS: VASCULAR Aorta: Normal in caliber. Diffuse atherosclerosis but no significant stenosis. No dissection. Celiac: Mild atherosclerosis at the origin. No significant stenosis. There are micro embolization coils along the gastric duodenal artery, new since the prior CT. No flow seen in the gastric duodenal artery. SMA: Patent without evidence of aneurysm, dissection, vasculitis or  significant stenosis. Renals: Both renal arteries are patent without evidence of aneurysm, dissection, vasculitis, fibromuscular dysplasia or significant stenosis. IMA: Small I IMA, not well-defined. Inflow: Common and external iliac artery atherosclerosis with no significant stenosis. Proximal Outflow: Common femoral artery atherosclerosis with no significant stenosis. Patent proximal femoral arteries and deep femoral arteries. Veins: No obvious venous abnormality within the limitations of this arterial phase study. Review of the MIP images confirms the above findings. NON-VASCULAR Lower chest: Focal area of opacity in the dependent right lower lobe, new. This is peribronchovascular and may be infectious or inflammatory but is nonspecific. Hepatobiliary: Unremarkable liver. Gallbladder mostly contracted. No convincing bile duct dilation. Pancreas: Duct is dilated up to 8 mm, measuring larger than on the recent prior CT. No pancreatic mass or convincing inflammation. Spleen: Low-density lesion over the dome of the spleen, 1.5 cm in size. This is consistent with a cyst and is unchanged. No other splenic abnormality. Adrenals/Urinary Tract: No adrenal masses. Marked bilateral renal atrophy with multiple renal masses. The largest of these arises from the anterior right kidney midpole, has an enhancing rim and is relative increased attenuation compared to other low-density masses. This is  consistent with a chronic hemorrhagic cyst, and was previously evaluated with MRI on 06/21/2021. No renal stones. No hydronephrosis. Ureters not well visualized, but no evidence of ureteral dilation. Bladder is decompressed. Stomach/Bowel: Stomach is unremarkable. Small bowel and colon are normal in overall caliber. Right mid abdomen colonic anastomosis staples consistent with an ileocolic anastomosis. Multiple additional vascular clips in the right lower quadrant and right mid abdomen. Small bowel and colon are not well-defined due to  adjacent ascites, limiting contrast. No convincing bowel inflammation. There is no extravasation of contrast to indicate an active GI bleeding source. Lymphatic: No enlarged lymph nodes. Reproductive: Unremarkable. Other: Moderate ascites similar to the prior CT. Musculoskeletal: No fracture or acute finding.  No bone lesion. IMPRESSION: VASCULAR 1. No evidence of an active GI bleeding source. 2. No enhancement/flow in the embolized gastro duodenal artery. 3. Aortic atherosclerosis. No aneurysm, dissection or significant stenosis. 4. Small irregular inferior mesenteric artery. Remaining aortic branch vessels are widely patent. NON-VASCULAR 1. No convincing acute findings. 2. Pancreatic duct is more dilated than on the prior CT, up to 8 mm, of unclear etiology. 3. Findings of end-stage renal disease. 4. Moderate ascites similar to the prior CT. 5. Stable changes from previous bowel surgery. Electronically Signed   By: Lajean Manes M.D.   On: 11/12/2021 17:05     LOS: 1 day   [place]  11/13/2021, 12:56 PM

## 2021-11-13 NOTE — Progress Notes (Addendum)
[facility] Progress Note  CC:  GI bleed  Assessment / Plan: Recurrent GI bleeding occurring after GDA embolization for a very large duodenal bulb ulcer with overlying clot 11/05/21. Admitted with a hemoglobin of 5.1, now 6.1. Surprisingly, BUN and creatinine are markedly improved compared to his hospitalization earlier this week.   CTA in the ED was negative for acute bleed.   He has had no further bleeding since his arrival and has been hemodynamically stable. In fact, he has not had a bowel movement.    He has significant comorbidities including ESRD with associated anemia of chronic kidney disease, HTN, HFrEF with an EF 40-45%, pericardial effusion on echo 12/22, history of colon cancer s/p lap colectomy 2014, history of SBO with LOA 2022, heroin abuse.   EGD recommended although therapeutic interventions may be very limited given the endoscopic findings on his EGD last week. Unfortunately, his hemoglobin is too low to proceed with monitored anesthesia care at this time, although some of this may be dilutional given his volume overload. Will need to coordinate timing of PRBCs transfusion with dialysis as he did not have a complete a full session of dialysis yesterday. Discussed with Dr. Nevada Crane, NP [facility], Dr. Jonnie Finner and the on-call anesthesia team, and our on-call endoscopy nurses to coordinate the timing of his endoscopic evaluation.    Plan: - Clear liquid diet today (no red liquids) - NPO at midnight - Serial hgb/hct with transfusions as indicated - IV PPI continuous infusion - EGD after correction of anemia and subsequent hemodialysis, tentatively scheduled for 11/14/21 - May need to consider angiogram based on endoscopy results - Consider repeat CTA +/- angio with significant GI bleeding that occurs prior to ability to perform endoscopy - Surgery following     Subjective: No GI complaints this morning. Notes he hasn't had any bleeding since he's been here and he  wants to eat. No family present. The nephrology team was present at the time of my evaluation.   Objective:  Vital signs in last 24 hours: Temp:  [97.5 F (36.4 C)-99 F (37.2 C)] 98.5 F (36.9 C) (01/29 1209) Pulse Rate:  [63-97] 63 (01/29 1209) Resp:  [15-26] 20 (01/29 1209) BP: (110-149)/(64-135) 137/87 (01/29 1209) SpO2:  [90 %-100 %] 100 % (01/29 1209) Last BM Date: 11/12/21 General:   Alert, chronically ill appearing, in NAD, no blood in the oropharynx, eating graham crackers Heart:  Regular rate and rhythm; no murmurs Pulm: Clear anteriorly; no wheezing Abdomen:  Soft. Thin, Nontender. Nondistended. Normal bowel sounds. No rebound or guarding. LAD: No inguinal or umbilical LAD Extremities:  Without edema. Neurologic:  Alert and  oriented x4;  grossly normal neurologically. Psych:  Alert and cooperative. Normal mood and affect.   Lab Results: Recent Labs    11/12/21 1501 11/12/21 1526 11/13/21 0113 11/13/21 0645  WBC 21.2*  --  10.5  --   HGB 5.1* 6.1* 6.1* 6.1*  HCT 15.7* 18.0* 18.3* 18.3*  PLT 284  --  232  --    BMET Recent Labs    11/12/21 1501 11/12/21 1526 11/13/21 0113  NA 138 136 132*  K 4.1 4.0 4.2  CL 98 96* 92*  CO2 27  --  25  GLUCOSE 130* 122* 83  BUN 41* 40* 53*  CREATININE 4.68* 4.70* 5.02*  CALCIUM 7.7*  --  7.7*   LFT Recent Labs    11/12/21 1501  PROT 5.6*  ALBUMIN 2.0*  AST 22  ALT 27  ALKPHOS 55  BILITOT 0.5   PT/INR Recent Labs    11/13/21 0113  LABPROT 18.0*  INR 1.5*     DG CHEST PORT 1 VIEW  Result Date: 11/13/2021 CLINICAL DATA:  Hypoxia. EXAM: PORTABLE CHEST 1 VIEW COMPARISON:  11/08/2021 and older exams. FINDINGS: Cardiac silhouette is mildly enlarged. No mediastinal or hilar masses. There are linear opacities in both lung bases and in the mid right lung. Hazy opacities noted in the left lower lung, behind the cardiac silhouette. Remainder of the lungs is clear. No pneumothorax. Skeletal structures are grossly  intact. IMPRESSION: 1. Findings without convincing change from the most recent prior study allowing for differences in patient positioning and technique. 2. Linear opacities in the lung bases and right mid lung are consistent with atelectasis. Additional hazy opacity in the left lung base is also likely due to atelectasis, possibly with layering pleural fluid. Infection at the left lung base should be considered in the proper clinical setting. 3. No evidence of pulmonary edema. Electronically Signed   By: Lajean Manes M.D.   On: 11/13/2021 10:13   CT Angio Abd/Pel W and/or Wo Contrast  Result Date: 11/12/2021 CLINICAL DATA:  via EMS; reported found on the floor;rectal bleeding; approx 400 ml blood loss per EMS; reported patient is also a dialysis patient and had a dull session today. Per EMS patient is slow to response; stated prior hx of bleeding ulcers and has had + blood on vomiting also. EXAM: CTA ABDOMEN AND PELVIS WITHOUT AND WITH CONTRAST TECHNIQUE: Multidetector CT imaging of the abdomen and pelvis was performed using the standard protocol during bolus administration of intravenous contrast. Multiplanar reconstructed images and MIPs were obtained and reviewed to evaluate the vascular anatomy. RADIATION DOSE REDUCTION: This exam was performed according to the departmental dose-optimization program which includes automated exposure control, adjustment of the mA and/or kV according to patient size and/or use of iterative reconstruction technique. CONTRAST:  110mL OMNIPAQUE IOHEXOL 350 MG/ML SOLN COMPARISON:  CT, 11/05/2021. FINDINGS: VASCULAR Aorta: Normal in caliber. Diffuse atherosclerosis but no significant stenosis. No dissection. Celiac: Mild atherosclerosis at the origin. No significant stenosis. There are micro embolization coils along the gastric duodenal artery, new since the prior CT. No flow seen in the gastric duodenal artery. SMA: Patent without evidence of aneurysm, dissection, vasculitis or  significant stenosis. Renals: Both renal arteries are patent without evidence of aneurysm, dissection, vasculitis, fibromuscular dysplasia or significant stenosis. IMA: Small I IMA, not well-defined. Inflow: Common and external iliac artery atherosclerosis with no significant stenosis. Proximal Outflow: Common femoral artery atherosclerosis with no significant stenosis. Patent proximal femoral arteries and deep femoral arteries. Veins: No obvious venous abnormality within the limitations of this arterial phase study. Review of the MIP images confirms the above findings. NON-VASCULAR Lower chest: Focal area of opacity in the dependent right lower lobe, new. This is peribronchovascular and may be infectious or inflammatory but is nonspecific. Hepatobiliary: Unremarkable liver. Gallbladder mostly contracted. No convincing bile duct dilation. Pancreas: Duct is dilated up to 8 mm, measuring larger than on the recent prior CT. No pancreatic mass or convincing inflammation. Spleen: Low-density lesion over the dome of the spleen, 1.5 cm in size. This is consistent with a cyst and is unchanged. No other splenic abnormality. Adrenals/Urinary Tract: No adrenal masses. Marked bilateral renal atrophy with multiple renal masses. The largest of these arises from the anterior right kidney midpole, has an enhancing rim and is relative increased attenuation compared to other low-density masses. This is  consistent with a chronic hemorrhagic cyst, and was previously evaluated with MRI on 06/21/2021. No renal stones. No hydronephrosis. Ureters not well visualized, but no evidence of ureteral dilation. Bladder is decompressed. Stomach/Bowel: Stomach is unremarkable. Small bowel and colon are normal in overall caliber. Right mid abdomen colonic anastomosis staples consistent with an ileocolic anastomosis. Multiple additional vascular clips in the right lower quadrant and right mid abdomen. Small bowel and colon are not well-defined due to  adjacent ascites, limiting contrast. No convincing bowel inflammation. There is no extravasation of contrast to indicate an active GI bleeding source. Lymphatic: No enlarged lymph nodes. Reproductive: Unremarkable. Other: Moderate ascites similar to the prior CT. Musculoskeletal: No fracture or acute finding.  No bone lesion. IMPRESSION: VASCULAR 1. No evidence of an active GI bleeding source. 2. No enhancement/flow in the embolized gastro duodenal artery. 3. Aortic atherosclerosis. No aneurysm, dissection or significant stenosis. 4. Small irregular inferior mesenteric artery. Remaining aortic branch vessels are widely patent. NON-VASCULAR 1. No convincing acute findings. 2. Pancreatic duct is more dilated than on the prior CT, up to 8 mm, of unclear etiology. 3. Findings of end-stage renal disease. 4. Moderate ascites similar to the prior CT. 5. Stable changes from previous bowel surgery. Electronically Signed   By: Lajean Manes M.D.   On: 11/12/2021 17:05     LOS: 1 day   [place]  11/13/2021, 12:56 PM

## 2021-11-13 NOTE — Progress Notes (Addendum)
PROGRESS NOTE  Timothy [name] NOM:[id number] DOB: 1960/12/30 DOA: 11/12/2021 PCP: [name], MD  HPI/Recap of past 24 hours: Timothy [name] is a 61 y.o. 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   Patient was found by family member on bathroom floor with large quantity of rectal bleed and called EMS.  EMS arrived and estimated blood loss about 400 ml. Work-up revealed GI bleed with symptomatic anemia with hemoglobin of 5.1 on presentation.  Patient received 3 unit PRBCs.  Nephrology, GI consulted.  Plan for hemodialysis on 11/13/2021.  And possible EGD on 11/14/2021.  11/13/2021: Patient was seen and examined at bedside.  Denies any chest pain.  Mild conversational dyspnea noted on exam.  Obtained a chest x-ray, showing mild increase in pulmonary vascularity suggestive of mild pulmonary edema.  Assessment/Plan: Principal Problem:   GI bleed Active Problems:   Duodenal ulcer with hemorrhage  Acute blood loss anemia secondary to recurrent GI bleed, suspect upper GI bleed. -Likely recurrent duodenum bulb ulcer bleed Presented with hemoglobin of 5.1 with symptomatic anemia. 3 units PRBC ordered to be transfused. Obtain CBC posttransfusion. -CTA not showing any active bleeding lesions for IR intervention, on PPI drip.  Plan for EGD on 11/14/2021.  Also seen by general surgery. If any evidence of overt bleeding recurs, repeat CTA abdomen.  Acute hypoxic respiratory failure secondary to mild pulm edema seen on chest x-ray. Personally reviewed chest x-ray done on 11/13/2021 showing increase in pulmonary vascularity suggestive of mild pulmonary edema. Not on supplementation at baseline. Currently requiring 2 L cementing with saturation greater than 92%.  ESRD on HD  TTS Last hemodialysis 11/12/2021. Appreciate nephrology's assistance, possible hemodialysis on 11/13/2021.   Coagulopathy -INR elevated on last 2 admissions INR 1.5 on 11/13/2021. LFTs within normal limit.   Question of hemochromatosis -Outpatient follow-up with hematology   History of pericardial effusion -Small amount on recent echo, consider recheck echo if after medically stable still having hypoxia   HTN -Hold all BP meds due to suspected active GI bleed. Maintain MAP greater than 65. Not on IV fluid hydration due to ESRD.   Critical care time: 65 minutes.    DVT prophylaxis: SCD, pharmacological DVT prophylaxis is contraindicated in the setting of suspected GI bleed Code Status: Full code Family Communication: None at bedside. Disposition Plan: Likely will discharge to home once GI and nephrology sign off. Consults called: GI, general surgery, nephrology. Admission status: Inpatient status.      Status is: Inpatient  Patient will require at least 2 midnights for further evaluation and treatment of present condition.      Objective: Vitals:   11/13/21 0853 11/13/21 1109 11/13/21 1138 11/13/21 1209  BP: 130/77 140/88 133/82 137/87  Pulse: 73 80 79 63  Resp: (!) 26 (!) 21 20 20   Temp: (!) 97.5 F (36.4 C) 97.6 F (36.4 C) 98.2 F (36.8 C) 98.5 F (36.9 C)  TempSrc: Oral Oral Oral Oral  SpO2: 100% 97% 100% 100%  Height:        Intake/Output Summary (Last 24 hours) at 11/13/2021 1446 Last data filed at 11/13/2021 1135 Gross per 24 hour  Intake 1590.47 ml  Output --  Net 1590.47 ml   There were no vitals filed for this visit.  Exam:  General: 61 y.o. year-old male well developed  well nourished in no acute distress.  Alert and oriented x3. Cardiovascular: Regular rate and rhythm with no rubs or gallops.  No thyromegaly or JVD noted.   Respiratory: Mild rales at bases, no wheezing noted.  Poor inspiratory effort. Abdomen: Soft nontender nondistended  with normal bowel sounds x4 quadrants. Musculoskeletal: No lower extremity edema bilaterally. Skin: No ulcerative lesions noted or rashes Psychiatry: Mood is appropriate for condition and setting Neuro: Moves all 4 extremities. Non focal.   Data Reviewed: CBC: Recent Labs  Lab 11/07/21 0859 11/08/21 0421 11/09/21 0434 11/12/21 1501 11/12/21 1526 11/13/21 0113 11/13/21 0645  WBC 14.8* 14.8* 11.7* 21.2*  --  10.5  --   NEUTROABS 11.0* 10.8* 7.8*  --   --   --   --   HGB 7.9* 8.1* 7.9* 5.1* 6.1* 6.1* 6.1*  HCT 23.4* 23.9* 23.4* 15.7* 18.0* 18.3* 18.3*  MCV 83.0 83.0 84.2 88.2  --  85.5  --   PLT 236 235 229 284  --  232  --    Basic Metabolic Panel: Recent Labs  Lab 11/07/21 0859 11/08/21 0421 11/09/21 0434 11/12/21 1501 11/12/21 1526 11/13/21 0113  NA 132* 129* 128* 138 136 132*  K 4.6 5.3* 4.6 4.1 4.0 4.2  CL 94* 91* 90* 98 96* 92*  CO2 19* 17* 21* 27  --  25  GLUCOSE 124* 90 93 130* 122* 83  BUN 151* 154* 89* 41* 40* 53*  CREATININE 8.25* 8.55* 6.52* 4.68* 4.70* 5.02*  CALCIUM 7.8* 7.7* 8.1* 7.7*  --  7.7*  PHOS 5.3* 6.4* 6.5*  --   --   --    GFR: Estimated Creatinine Clearance: 15.8 mL/min (A) (by C-G formula based on SCr of 5.02 mg/dL (H)). Liver Function Tests: Recent Labs  Lab 11/07/21 0859 11/07/21 1207 11/08/21 0421 11/09/21 0434 11/12/21 1501  AST  --  48*  --   --  22  ALT  --  66*  --   --  27  ALKPHOS  --  78  --   --  55  BILITOT  --  1.1  --   --  0.5  PROT  --  6.5  --   --  5.6*  ALBUMIN 2.4* 2.4* 2.5* 2.4* 2.0*   No results for input(s): LIPASE, AMYLASE in the last 168 hours. No results for input(s): AMMONIA in the last 168 hours. Coagulation Profile: Recent Labs  Lab 11/07/21 1207 11/13/21 0113  INR 1.5* 1.5*   Cardiac Enzymes: No results for input(s): CKTOTAL, CKMB, CKMBINDEX, TROPONINI in the last 168 hours. BNP (last 3 results) No results for input(s): PROBNP in the last 8760 hours. HbA1C: No results for input(s): HGBA1C  in the last 72 hours. CBG: Recent Labs  Lab 11/06/21 1515 11/06/21 1923  GLUCAP 125* 130*   Lipid Profile: No results for input(s): CHOL, HDL, LDLCALC, TRIG, CHOLHDL, LDLDIRECT in the last 72 hours. Thyroid Function Tests: No results for input(s): TSH, T4TOTAL, FREET4, T3FREE, THYROIDAB in the last 72 hours. Anemia Panel: No results for input(s): VITAMINB12, FOLATE, FERRITIN, TIBC, IRON, RETICCTPCT in the last 72 hours. Urine analysis:    Component Value Date/Time   COLORURINE YELLOW 05/19/2021 2015   APPEARANCEUR HAZY (A) 05/19/2021 2015   LABSPEC 1.012 05/19/2021 2015   PHURINE 9.0 (H) 05/19/2021 2015   GLUCOSEU NEGATIVE 05/19/2021 2015   HGBUR NEGATIVE 05/19/2021 2015   BILIRUBINUR NEGATIVE 05/19/2021 2015   [place] 05/19/2021 2015   PROTEINUR >=300 (A) 05/19/2021 2015  NITRITE NEGATIVE 05/19/2021 2015   LEUKOCYTESUR SMALL (A) 05/19/2021 2015   Sepsis Labs: @LABRCNTIP (procalcitonin:4,lacticidven:4)  ) Recent Results (from the past 240 hour(s))  Resp Panel by RT-PCR (Flu A&B, Covid) Nasopharyngeal Swab     Status: None   Collection Time: 11/04/21 11:51 PM   Specimen: Nasopharyngeal Swab; Nasopharyngeal(NP) swabs in vial transport medium  Result Value Ref Range Status   SARS Coronavirus 2 by RT PCR NEGATIVE NEGATIVE Final    Comment: (NOTE) SARS-CoV-2 target nucleic acids are NOT DETECTED.  The SARS-CoV-2 RNA is generally detectable in upper respiratory specimens during the acute phase of infection. The lowest concentration of SARS-CoV-2 viral copies this assay can detect is 138 copies/mL. A negative result does not preclude SARS-Cov-2 infection and should not be used as the sole basis for treatment or other patient management decisions. A negative result may occur with  improper specimen collection/handling, submission of specimen other than nasopharyngeal swab, presence of viral mutation(s) within the areas targeted by this assay, and inadequate number  of viral copies(<138 copies/mL). A negative result must be combined with clinical observations, patient history, and epidemiological information. The expected result is Negative.  Fact Sheet for Patients:  EntrepreneurPulse.com.au  Fact Sheet for Healthcare Providers:  IncredibleEmployment.be  This test is no t yet approved or cleared by the [place] FDA and  has been authorized for detection and/or diagnosis of SARS-CoV-2 by FDA under an Emergency Use Authorization (EUA). This EUA will remain  in effect (meaning this test can be used) for the duration of the COVID-19 declaration under Section 564(b)(1) of the Act, 21 U.S.C.section 360bbb-3(b)(1), unless the authorization is terminated  or revoked sooner.       Influenza A by PCR NEGATIVE NEGATIVE Final   Influenza B by PCR NEGATIVE NEGATIVE Final    Comment: (NOTE) The Xpert Xpress SARS-CoV-2/FLU/RSV plus assay is intended as an aid in the diagnosis of influenza from Nasopharyngeal swab specimens and should not be used as a sole basis for treatment. Nasal washings and aspirates are unacceptable for Xpert Xpress SARS-CoV-2/FLU/RSV testing.  Fact Sheet for Patients: EntrepreneurPulse.com.au  Fact Sheet for Healthcare Providers: IncredibleEmployment.be  This test is not yet approved or cleared by the [place] FDA and has been authorized for detection and/or diagnosis of SARS-CoV-2 by FDA under an Emergency Use Authorization (EUA). This EUA will remain in effect (meaning this test can be used) for the duration of the COVID-19 declaration under Section 564(b)(1) of the Act, 21 U.S.C. section 360bbb-3(b)(1), unless the authorization is terminated or revoked.  Performed at [facility], [place] [address], [place], [place] [address]   MRSA Next Gen by PCR, Nasal     Status: None   Collection Time: 11/05/21  9:38 AM   Specimen: Nasal Mucosa;  Nasal Swab  Result Value Ref Range Status   MRSA by PCR Next Gen NOT DETECTED NOT DETECTED Final    Comment: (NOTE) The GeneXpert MRSA Assay (FDA approved for NASAL specimens only), is one component of a comprehensive MRSA colonization surveillance program. It is not intended to diagnose MRSA infection nor to guide or monitor treatment for MRSA infections. Test performance is not FDA approved in patients less than 29 years old. Performed at [facility], [place] [address] [place], [place] [address]   Resp Panel by RT-PCR (Flu A&B, Covid) Nasopharyngeal Swab     Status: None   Collection Time: 11/12/21  4:22 PM   Specimen: Nasopharyngeal Swab; Nasopharyngeal(NP) swabs in vial transport  medium  Result Value Ref Range Status   SARS Coronavirus 2 by RT PCR NEGATIVE NEGATIVE Final    Comment: (NOTE) SARS-CoV-2 target nucleic acids are NOT DETECTED.  The SARS-CoV-2 RNA is generally detectable in upper respiratory specimens during the acute phase of infection. The lowest concentration of SARS-CoV-2 viral copies this assay can detect is 138 copies/mL. A negative result does not preclude SARS-Cov-2 infection and should not be used as the sole basis for treatment or other patient management decisions. A negative result may occur with  improper specimen collection/handling, submission of specimen other than nasopharyngeal swab, presence of viral mutation(s) within the areas targeted by this assay, and inadequate number of viral copies(<138 copies/mL). A negative result must be combined with clinical observations, patient history, and epidemiological information. The expected result is Negative.  Fact Sheet for Patients:  EntrepreneurPulse.com.au  Fact Sheet for Healthcare Providers:  IncredibleEmployment.be  This test is no t yet approved or cleared by the [place] FDA and  has been authorized for detection and/or diagnosis of SARS-CoV-2  by FDA under an Emergency Use Authorization (EUA). This EUA will remain  in effect (meaning this test can be used) for the duration of the COVID-19 declaration under Section 564(b)(1) of the Act, 21 U.S.C.section 360bbb-3(b)(1), unless the authorization is terminated  or revoked sooner.       Influenza A by PCR NEGATIVE NEGATIVE Final   Influenza B by PCR NEGATIVE NEGATIVE Final    Comment: (NOTE) The Xpert Xpress SARS-CoV-2/FLU/RSV plus assay is intended as an aid in the diagnosis of influenza from Nasopharyngeal swab specimens and should not be used as a sole basis for treatment. Nasal washings and aspirates are unacceptable for Xpert Xpress SARS-CoV-2/FLU/RSV testing.  Fact Sheet for Patients: EntrepreneurPulse.com.au  Fact Sheet for Healthcare Providers: IncredibleEmployment.be  This test is not yet approved or cleared by the [place] FDA and has been authorized for detection and/or diagnosis of SARS-CoV-2 by FDA under an Emergency Use Authorization (EUA). This EUA will remain in effect (meaning this test can be used) for the duration of the COVID-19 declaration under Section 564(b)(1) of the Act, 21 U.S.C. section 360bbb-3(b)(1), unless the authorization is terminated or revoked.  Performed at [facility], [place] [address] [place], [place] [address]       Studies: DG CHEST PORT 1 VIEW  Result Date: 11/13/2021 CLINICAL DATA:  Hypoxia. EXAM: PORTABLE CHEST 1 VIEW COMPARISON:  11/08/2021 and older exams. FINDINGS: Cardiac silhouette is mildly enlarged. No mediastinal or hilar masses. There are linear opacities in both lung bases and in the mid right lung. Hazy opacities noted in the left lower lung, behind the cardiac silhouette. Remainder of the lungs is clear. No pneumothorax. Skeletal structures are grossly intact. IMPRESSION: 1. Findings without convincing change from the most recent prior study allowing for differences in  patient positioning and technique. 2. Linear opacities in the lung bases and right mid lung are consistent with atelectasis. Additional hazy opacity in the left lung base is also likely due to atelectasis, possibly with layering pleural fluid. Infection at the left lung base should be considered in the proper clinical setting. 3. No evidence of pulmonary edema. Electronically Signed   By: Lajean Manes M.D.   On: 11/13/2021 10:13   CT Angio Abd/Pel W and/or Wo Contrast  Result Date: 11/12/2021 CLINICAL DATA:  via EMS; reported found on the floor;rectal bleeding; approx 400 ml blood loss per EMS; reported patient is also a dialysis patient and  had a dull session today. Per EMS patient is slow to response; stated prior hx of bleeding ulcers and has had + blood on vomiting also. EXAM: CTA ABDOMEN AND PELVIS WITHOUT AND WITH CONTRAST TECHNIQUE: Multidetector CT imaging of the abdomen and pelvis was performed using the standard protocol during bolus administration of intravenous contrast. Multiplanar reconstructed images and MIPs were obtained and reviewed to evaluate the vascular anatomy. RADIATION DOSE REDUCTION: This exam was performed according to the departmental dose-optimization program which includes automated exposure control, adjustment of the mA and/or kV according to patient size and/or use of iterative reconstruction technique. CONTRAST:  132mL OMNIPAQUE IOHEXOL 350 MG/ML SOLN COMPARISON:  CT, 11/05/2021. FINDINGS: VASCULAR Aorta: Normal in caliber. Diffuse atherosclerosis but no significant stenosis. No dissection. Celiac: Mild atherosclerosis at the origin. No significant stenosis. There are micro embolization coils along the gastric duodenal artery, new since the prior CT. No flow seen in the gastric duodenal artery. SMA: Patent without evidence of aneurysm, dissection, vasculitis or significant stenosis. Renals: Both renal arteries are patent without evidence of aneurysm, dissection, vasculitis,  fibromuscular dysplasia or significant stenosis. IMA: Small I IMA, not well-defined. Inflow: Common and external iliac artery atherosclerosis with no significant stenosis. Proximal Outflow: Common femoral artery atherosclerosis with no significant stenosis. Patent proximal femoral arteries and deep femoral arteries. Veins: No obvious venous abnormality within the limitations of this arterial phase study. Review of the MIP images confirms the above findings. NON-VASCULAR Lower chest: Focal area of opacity in the dependent right lower lobe, new. This is peribronchovascular and may be infectious or inflammatory but is nonspecific. Hepatobiliary: Unremarkable liver. Gallbladder mostly contracted. No convincing bile duct dilation. Pancreas: Duct is dilated up to 8 mm, measuring larger than on the recent prior CT. No pancreatic mass or convincing inflammation. Spleen: Low-density lesion over the dome of the spleen, 1.5 cm in size. This is consistent with a cyst and is unchanged. No other splenic abnormality. Adrenals/Urinary Tract: No adrenal masses. Marked bilateral renal atrophy with multiple renal masses. The largest of these arises from the anterior right kidney midpole, has an enhancing rim and is relative increased attenuation compared to other low-density masses. This is consistent with a chronic hemorrhagic cyst, and was previously evaluated with MRI on 06/21/2021. No renal stones. No hydronephrosis. Ureters not well visualized, but no evidence of ureteral dilation. Bladder is decompressed. Stomach/Bowel: Stomach is unremarkable. Small bowel and colon are normal in overall caliber. Right mid abdomen colonic anastomosis staples consistent with an ileocolic anastomosis. Multiple additional vascular clips in the right lower quadrant and right mid abdomen. Small bowel and colon are not well-defined due to adjacent ascites, limiting contrast. No convincing bowel inflammation. There is no extravasation of contrast to  indicate an active GI bleeding source. Lymphatic: No enlarged lymph nodes. Reproductive: Unremarkable. Other: Moderate ascites similar to the prior CT. Musculoskeletal: No fracture or acute finding.  No bone lesion. IMPRESSION: VASCULAR 1. No evidence of an active GI bleeding source. 2. No enhancement/flow in the embolized gastro duodenal artery. 3. Aortic atherosclerosis. No aneurysm, dissection or significant stenosis. 4. Small irregular inferior mesenteric artery. Remaining aortic branch vessels are widely patent. NON-VASCULAR 1. No convincing acute findings. 2. Pancreatic duct is more dilated than on the prior CT, up to 8 mm, of unclear etiology. 3. Findings of end-stage renal disease. 4. Moderate ascites similar to the prior CT. 5. Stable changes from previous bowel surgery. Electronically Signed   By: Lajean Manes M.D.   On: 11/12/2021 17:05  Scheduled Meds:  Chlorhexidine Gluconate Cloth  6 each Topical Q0600   doxercalciferol  2 mcg Intravenous Q T,Th,Sa-HD   sevelamer carbonate  1,600 mg Oral TID with meals    Continuous Infusions:  sodium chloride     sodium chloride     pantoprazole 8 mg/hr (11/12/21 1638)   sodium chloride       LOS: 1 day     Kayleen Memos, MD [facility] Pager [phone]  If 7PM-7AM, please contact night-coverage [email or web address] Password [facility] 11/13/2021, 2:46 PM

## 2021-11-13 NOTE — Progress Notes (Signed)
Patient received from night RN SOB, in bed, took off 2L [place], placed it back on patient and explained importance on keeping at this time.  Lungs diminished, congested, with expiratory wheezing, fine crackles appreciated on RUL.  Explained to patient about a 3rd unit of PRBC pending for transfusion.  Dr. [place] Crane made aware of patient's current condition.  Dr. [place] Crane rounding and saw patient.  Will continue to monitor.

## 2021-11-13 NOTE — Plan of Care (Signed)

## 2021-11-13 NOTE — Plan of Care (Signed)
Problem: Education: Goal: Ability to demonstrate management of disease process will improve 11/13/2021 0404 by Madie Reno, RN Outcome: Progressing 11/13/2021 0343 by Madie Reno, RN Outcome: Progressing Goal: Ability to verbalize understanding of medication therapies will improve 11/13/2021 0404 by Madie Reno, RN Outcome: Progressing 11/13/2021 0343 by Madie Reno, RN Outcome: Progressing Goal: Individualized Educational Video(s) 11/13/2021 0404 by Madie Reno, RN Outcome: Progressing 11/13/2021 0343 by Madie Reno, RN Outcome: Progressing   Problem: Activity: Goal: Capacity to carry out activities will improve 11/13/2021 0404 by Madie Reno, RN Outcome: Progressing 11/13/2021 0343 by Madie Reno, RN Outcome: Progressing   Problem: Cardiac: Goal: Ability to achieve and maintain adequate cardiopulmonary perfusion will improve 11/13/2021 0404 by Madie Reno, RN Outcome: Progressing 11/13/2021 0343 by Madie Reno, RN Outcome: Progressing   Problem: Education: Goal: Knowledge of General Education information will improve Description: Including pain rating scale, medication(s)/side effects and non-pharmacologic comfort measures 11/13/2021 0404 by Madie Reno, RN Outcome: Progressing 11/13/2021 0343 by Madie Reno, RN Outcome: Progressing   Problem: Health Behavior/Discharge Planning: Goal: Ability to manage health-related needs will improve 11/13/2021 0404 by Madie Reno, RN Outcome: Progressing 11/13/2021 0343 by Madie Reno, RN Outcome: Progressing   Problem: Clinical Measurements: Goal: Ability to maintain clinical measurements within normal limits will improve 11/13/2021 0404 by Madie Reno, RN Outcome: Progressing 11/13/2021 0343 by Madie Reno, RN Outcome: Progressing Goal: Will remain free from infection 11/13/2021 0404 by Madie Reno, RN Outcome:  Progressing 11/13/2021 0343 by Madie Reno, RN Outcome: Progressing Goal: Diagnostic test results will improve 11/13/2021 0404 by Madie Reno, RN Outcome: Progressing 11/13/2021 0343 by Madie Reno, RN Outcome: Progressing Goal: Respiratory complications will improve 11/13/2021 0404 by Madie Reno, RN Outcome: Progressing 11/13/2021 0343 by Madie Reno, RN Outcome: Progressing Goal: Cardiovascular complication will be avoided 11/13/2021 0404 by Madie Reno, RN Outcome: Progressing 11/13/2021 0343 by Madie Reno, RN Outcome: Progressing   Problem: Activity: Goal: Risk for activity intolerance will decrease 11/13/2021 0404 by Madie Reno, RN Outcome: Progressing 11/13/2021 0343 by Madie Reno, RN Outcome: Progressing   Problem: Nutrition: Goal: Adequate nutrition will be maintained 11/13/2021 0404 by Madie Reno, RN Outcome: Progressing 11/13/2021 0343 by Madie Reno, RN Outcome: Progressing   Problem: Coping: Goal: Level of anxiety will decrease 11/13/2021 0404 by Madie Reno, RN Outcome: Progressing 11/13/2021 0343 by Madie Reno, RN Outcome: Progressing   Problem: Elimination: Goal: Will not experience complications related to bowel motility 11/13/2021 0404 by Madie Reno, RN Outcome: Progressing 11/13/2021 0343 by Madie Reno, RN Outcome: Progressing Goal: Will not experience complications related to urinary retention 11/13/2021 0404 by Madie Reno, RN Outcome: Progressing 11/13/2021 0343 by Madie Reno, RN Outcome: Progressing   Problem: Pain Managment: Goal: General experience of comfort will improve 11/13/2021 0404 by Madie Reno, RN Outcome: Progressing 11/13/2021 0343 by Madie Reno, RN Outcome: Progressing   Problem: Safety: Goal: Ability to remain free from injury will improve 11/13/2021 0404 by Madie Reno, RN Outcome:  Progressing 11/13/2021 0343 by Madie Reno, RN Outcome: Progressing   Problem: Skin Integrity: Goal: Risk for impaired skin integrity will decrease 11/13/2021 0404 by Madie Reno, RN Outcome: Progressing 11/13/2021 0343 by Madie Reno, RN Outcome: Progressing   Problem: Education: Goal: Ability to demonstrate management of disease process will improve Outcome: Progressing Goal: Ability  to verbalize understanding of medication therapies will improve Outcome: Progressing Goal: Individualized Educational Video(s) Outcome: Progressing   Problem: Activity: Goal: Capacity to carry out activities will improve Outcome: Progressing   Problem: Cardiac: Goal: Ability to achieve and maintain adequate cardiopulmonary perfusion will improve Outcome: Progressing

## 2021-11-13 NOTE — Anesthesia Preprocedure Evaluation (Addendum)
Anesthesia Evaluation  Patient identified by MRN, date of birth, ID band Patient awake    Reviewed: Allergy & Precautions, NPO status , Patient's Chart, lab work & pertinent test results  Airway Mallampati: II  TM Distance: >3 FB Neck ROM: Full    Dental  (+) Poor Dentition, Missing   Pulmonary Current Smoker,   Pre-procedure breathing treatment   + decreased breath sounds+ wheezing      Cardiovascular hypertension, Pt. on medications and Pt. 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, GIB   Endo/Other  negative endocrine ROS  Renal/GU ESRF and DialysisRenal disease  negative genitourinary   Musculoskeletal negative musculoskeletal ROS (+)   Abdominal Normal abdominal exam  (+)   Peds  Hematology  (+) anemia ,   Anesthesia Other Findings   Reproductive/Obstetrics                                                             Anesthesia Evaluation  Patient identified by MRN, date of birth, ID band Patient awake    Reviewed: Allergy & Precautions, NPO status , Patient's Chart, lab work & pertinent test results  Airway Mallampati: I  TM Distance: >3 FB Neck ROM: Full    Dental  (+) Poor Dentition, Missing   Pulmonary neg pulmonary ROS, Current Smoker,    Pulmonary exam normal        Cardiovascular hypertension, Pt. on medications and Pt. on home beta blockers +CHF and + DOE   Rhythm:Regular Rate:Normal  New large pericardial effusion per CT imaging for GIB assessment   Neuro/Psych Depression negative neurological ROS     GI/Hepatic (+)     substance abuse  marijuana use, GIB   Endo/Other  negative endocrine ROS  Renal/GU ESRF and DialysisRenal disease  negative genitourinary   Musculoskeletal  (+) narcotic  dependent  Abdominal Normal abdominal exam  (+)   Peds  Hematology  (+) anemia ,   Anesthesia Other Findings   Reproductive/Obstetrics                             Anesthesia Physical Anesthesia Plan  ASA: 4 and emergent  Anesthesia Plan: General   Post-op Pain Management:    Induction: Rapid sequence and Intravenous  PONV Risk Score and Plan: 1 and Ondansetron, Dexamethasone and Treatment may vary due to age or medical condition  Airway Management Planned: Mask and Oral ETT  Additional Equipment: None  Intra-op Plan:   Post-operative Plan: Extubation in OR  Informed Consent: I have reviewed the patients History and Physical, chart, labs and discussed the procedure including the risks, benefits and alternatives for the proposed anesthesia with the patient or authorized representative who has indicated his/her understanding and acceptance.     Dental advisory given  Plan Discussed with: CRNA  Anesthesia Plan Comments: (Lab Results      Component                Value               Date  WBC                      11.1 (H)            11/04/2021                HGB                      9.2 (L)             11/04/2021                HCT                      27.0 (L)            11/04/2021                MCV                      83.6                11/04/2021                PLT                      310                 11/04/2021           Lab Results      Component                Value               Date                      NA                       133 (L)             11/04/2021                K                        4.2                 11/04/2021                CO2                      28                  11/04/2021                GLUCOSE                  85                  11/04/2021                BUN                      60 (H)              11/04/2021                CREATININE  6.50 (H)            11/04/2021                 CALCIUM                  7.6 (L)             11/04/2021                GFRNONAA                 9 (L)               11/04/2021           ECHO 12/22: 1. Left ventricular ejection fraction, by estimation, is 40 to 45%. The  left ventricle has mildly decreased function. The left ventricle  demonstrates global hypokinesis. There is moderate concentric left  ventricular hypertrophy. Left ventricular  diastolic function could not be evaluated.  2. Right ventricular systolic function is low normal. The right  ventricular size is moderately enlarged. Moderately increased right  ventricular wall thickness. There is moderately elevated pulmonary artery  systolic pressure.  3. A small pericardial effusion is present. The pericardial effusion is  circumferential. There is no evidence of cardiac tamponade.  4. Moderate mitral valve regurgitation.  5. Tricuspid valve regurgitation is moderate to severe.  6. The aortic valve is tricuspid. There is mild calcification of the  aortic valve. There is moderate thickening of the aortic valve. Aortic  valve regurgitation is mild.  7. The inferior vena cava is dilated in size with <50% respiratory  variability, suggesting right atrial pressure of 15 mmHg. )        Anesthesia Quick Evaluation                                   Anesthesia Evaluation  Patient identified by MRN, date of birth, ID band Patient awake    Reviewed: Allergy & Precautions, NPO status , Patient's Chart, lab work & pertinent test results  Airway Mallampati: I  TM Distance: >3 FB Neck ROM: Full    Dental  (+) Poor Dentition, Missing   Pulmonary neg pulmonary ROS, Current Smoker,    Pulmonary exam normal        Cardiovascular hypertension, Pt. on medications and Pt. on home beta blockers +CHF and + DOE   Rhythm:Regular Rate:Normal  New large pericardial effusion per CT imaging for GIB assessment   Neuro/Psych Depression negative  neurological ROS     GI/Hepatic (+)     substance abuse  marijuana use, GIB   Endo/Other  negative endocrine ROS  Renal/GU ESRF and DialysisRenal disease  negative genitourinary   Musculoskeletal  (+) narcotic dependent  Abdominal Normal abdominal exam  (+)   Peds  Hematology  (+) anemia ,   Anesthesia Other Findings   Reproductive/Obstetrics                             Anesthesia Physical Anesthesia Plan  ASA: 4 and emergent  Anesthesia Plan: General   Post-op Pain Management:    Induction: Rapid sequence and Intravenous  PONV Risk Score and Plan: 1 and Ondansetron, Dexamethasone and Treatment may vary due to age or medical condition  Airway Management Planned: Mask and Oral ETT  Additional Equipment: None  Intra-op Plan:   Post-operative  Plan: Extubation in OR  Informed Consent: I have reviewed the patients History and Physical, chart, labs and discussed the procedure including the risks, benefits and alternatives for the proposed anesthesia with the patient or authorized representative who has indicated his/her understanding and acceptance.     Dental advisory given  Plan Discussed with: CRNA  Anesthesia Plan Comments: (Lab Results      Component                Value               Date                      WBC                      11.1 (H)            11/04/2021                HGB                      9.2 (L)             11/04/2021                HCT                      27.0 (L)            11/04/2021                MCV                      83.6                11/04/2021                PLT                      310                 11/04/2021           Lab Results      Component                Value               Date                      NA                       133 (L)             11/04/2021                K                        4.2                 11/04/2021                CO2                      28                   11/04/2021  GLUCOSE                  85                  11/04/2021                BUN                      60 (H)              11/04/2021                CREATININE               6.50 (H)            11/04/2021                CALCIUM                  7.6 (L)             11/04/2021                GFRNONAA                 9 (L)               11/04/2021           ECHO 12/22: 1. Left ventricular ejection fraction, by estimation, is 40 to 45%. The  left ventricle has mildly decreased function. The left ventricle  demonstrates global hypokinesis. There is moderate concentric left  ventricular hypertrophy. Left ventricular  diastolic function could not be evaluated.  2. Right ventricular systolic function is low normal. The right  ventricular size is moderately enlarged. Moderately increased right  ventricular wall thickness. There is moderately elevated pulmonary artery  systolic pressure.  3. A small pericardial effusion is present. The pericardial effusion is  circumferential. There is no evidence of cardiac tamponade.  4. Moderate mitral valve regurgitation.  5. Tricuspid valve regurgitation is moderate to severe.  6. The aortic valve is tricuspid. There is mild calcification of the  aortic valve. There is moderate thickening of the aortic valve. Aortic  valve regurgitation is mild.  7. The inferior vena cava is dilated in size with <50% respiratory  variability, suggesting right atrial pressure of 15 mmHg. )        Anesthesia Quick Evaluation                                   Anesthesia Evaluation  Patient identified by MRN, date of birth, ID band Patient awake    Reviewed: Allergy & Precautions, NPO status , Patient's Chart, lab work & pertinent test results  Airway Mallampati: I  TM Distance: >3 FB Neck ROM: Full    Dental  (+) Poor Dentition, Missing   Pulmonary neg pulmonary ROS, Current Smoker,    Pulmonary exam normal         Cardiovascular hypertension, Pt. on medications and Pt. on home beta blockers +CHF and + DOE   Rhythm:Regular Rate:Normal  New large pericardial effusion per CT imaging for GIB assessment   Neuro/Psych Depression negative neurological ROS     GI/Hepatic (+)     substance abuse  marijuana use, GIB   Endo/Other  negative endocrine ROS  Renal/GU ESRF and DialysisRenal disease  negative genitourinary   Musculoskeletal  (+)  narcotic dependent  Abdominal Normal abdominal exam  (+)   Peds  Hematology  (+) anemia ,   Anesthesia Other Findings   Reproductive/Obstetrics                             Anesthesia Physical Anesthesia Plan  ASA: 4 and emergent  Anesthesia Plan: General   Post-op Pain Management:    Induction: Rapid sequence and Intravenous  PONV Risk Score and Plan: 1 and Ondansetron, Dexamethasone and Treatment may vary due to age or medical condition  Airway Management Planned: Mask and Oral ETT  Additional Equipment: None  Intra-op Plan:   Post-operative Plan: Extubation in OR  Informed Consent: I have reviewed the patients History and Physical, chart, labs and discussed the procedure including the risks, benefits and alternatives for the proposed anesthesia with the patient or authorized representative who has indicated his/her understanding and acceptance.     Dental advisory given  Plan Discussed with: CRNA  Anesthesia Plan Comments: (Lab Results      Component                Value               Date                      WBC                      11.1 (H)            11/04/2021                HGB                      9.2 (L)             11/04/2021                HCT                      27.0 (L)            11/04/2021                MCV                      83.6                11/04/2021                PLT                      310                 11/04/2021           Lab Results      Component                Value                Date                      NA                       133 (L)             11/04/2021  K                        4.2                 11/04/2021                CO2                      28                  11/04/2021                GLUCOSE                  85                  11/04/2021                BUN                      60 (H)              11/04/2021                CREATININE               6.50 (H)            11/04/2021                CALCIUM                  7.6 (L)             11/04/2021                GFRNONAA                 9 (L)               11/04/2021           ECHO 12/22: 1. Left ventricular ejection fraction, by estimation, is 40 to 45%. The  left ventricle has mildly decreased function. The left ventricle  demonstrates global hypokinesis. There is moderate concentric left  ventricular hypertrophy. Left ventricular  diastolic function could not be evaluated.  2. Right ventricular systolic function is low normal. The right  ventricular size is moderately enlarged. Moderately increased right  ventricular wall thickness. There is moderately elevated pulmonary artery  systolic pressure.  3. A small pericardial effusion is present. The pericardial effusion is  circumferential. There is no evidence of cardiac tamponade.  4. Moderate mitral valve regurgitation.  5. Tricuspid valve regurgitation is moderate to severe.  6. The aortic valve is tricuspid. There is mild calcification of the  aortic valve. There is moderate thickening of the aortic valve. Aortic  valve regurgitation is mild.  7. The inferior vena cava is dilated in size with <50% respiratory  variability, suggesting right atrial pressure of 15 mmHg. )        Anesthesia Quick Evaluation  Anesthesia Physical Anesthesia Plan  ASA: 3  Anesthesia Plan: MAC   Post-op Pain Management:    Induction: Intravenous  PONV Risk Score and Plan: Propofol infusion and Treatment may vary due to  age or medical condition  Airway Management Planned: Simple Face Mask, Natural Airway and Nasal Cannula  Additional Equipment: None  Intra-op Plan:   Post-operative Plan:   Informed Consent: I  have reviewed the patients History and Physical, chart, labs and discussed the procedure including the risks, benefits and alternatives for the proposed anesthesia with the patient or authorized representative who has indicated his/her understanding and acceptance.     Dental advisory given  Plan Discussed with: CRNA  Anesthesia Plan Comments: (PRE-PROCEDURE ISTAT GIVEN SEVERE ANEMIA from 1/29 labwork Lab Results      Component                Value               Date                      WBC                      10.5                11/13/2021                HGB                      6.1 (LL)            11/13/2021                HCT                      18.3 (L)            11/13/2021                MCV                      85.5                11/13/2021                PLT                      232                 11/13/2021           Lab Results      Component                Value               Date                      NA                       132 (L)             11/13/2021                K                        4.2                 11/13/2021                CO2                      25                  11/13/2021  GLUCOSE                  83                  11/13/2021                BUN                      53 (H)              11/13/2021                CREATININE               5.02 (H)            11/13/2021                CALCIUM                  7.7 (L)             11/13/2021                GFRNONAA                 12 (L)              11/13/2021            ECHO 12/22: 1. Left ventricular ejection fraction, by estimation, is 40 to 45%. The  left ventricle has mildly decreased function. The left ventricle  demonstrates global hypokinesis. There is moderate concentric left   ventricular hypertrophy. Left ventricular  diastolic function could not be evaluated.  2. Right ventricular systolic function is low normal. The right  ventricular size is moderately enlarged. Moderately increased right  ventricular wall thickness. There is moderately elevated pulmonary artery  systolic pressure.  3. A small pericardial effusion is present. The pericardial effusion is  circumferential. There is no evidence of cardiac tamponade.  4. Moderate mitral valve regurgitation.  5. Tricuspid valve regurgitation is moderate to severe.  6. The aortic valve is tricuspid. There is mild calcification of the  aortic valve. There is moderate thickening of the aortic valve. Aortic  valve regurgitation is mild.  7. The inferior vena cava is dilated in size with <50% respiratory  variability, suggesting right atrial pressure of 15 mmHg. )       Anesthesia Quick Evaluation

## 2021-11-13 NOTE — Progress Notes (Signed)
°   11/13/21 0853  Assess: MEWS Score  Temp (!) 97.5 F (36.4 C)  BP 130/77  Pulse Rate 73  ECG Heart Rate 74  Resp (!) 26  Level of Consciousness Alert  SpO2 100 %  O2 Device Nasal Cannula  O2 Flow Rate (L/min) 2 L/min  Assess: MEWS Score  MEWS Temp 0  MEWS Systolic 0  MEWS Pulse 0  MEWS RR 2  MEWS LOC 0  MEWS Score 2  MEWS Score Color Yellow  Assess: if the MEWS score is Yellow or Red  Were vital signs taken at a resting state? Yes  Focused Assessment No change from prior assessment  Early Detection of Sepsis Score *See Row Information* Low  MEWS guidelines implemented *See Row Information* Yes  Treat  MEWS Interventions Other (Comment) (continue monitoring)  Pain Scale 0-10  Pain Score 0  Take Vital Signs  Increase Vital Sign Frequency  Yellow: Q 2hr X 2 then Q 4hr X 2, if remains yellow, continue Q 4hrs  Escalate  MEWS: Escalate Yellow: discuss with charge nurse/RN and consider discussing with provider and RRT  Notify: Charge Nurse/RN  Name of Charge Nurse/RN Notified Chartered certified accountant  Date Charge Nurse/RN Notified 11/13/21  Time Charge Nurse/RN Notified 0930  Notify: Provider  Provider Name/Title Dr. Nevada Crane  Date Provider Notified 11/13/21  Time Provider Notified 1020  Notification Type Call  Notification Reason Other (Comment) (yellow mews)  Provider response Other (Comment) (MD has seen the patient during rounds)  Date of Provider Response 11/13/21  Time of Provider Response 1020  Notify: Rapid Response  Name of Rapid Response RN Notified not applicable  Document  Patient Outcome Other (Comment) (Patient has been stable, no distress at this time.)  Progress note created (see row info) Yes

## 2021-11-13 NOTE — Plan of Care (Signed)
°  Problem: Education: Goal: Ability to demonstrate management of disease process will improve Outcome: Progressing Goal: Ability to verbalize understanding of medication therapies will improve Outcome: Progressing Goal: Individualized Educational Video(s) Outcome: Progressing   Problem: Activity: Goal: Capacity to carry out activities will improve Outcome: Progressing   Problem: Cardiac: Goal: Ability to achieve and maintain adequate cardiopulmonary perfusion will improve Outcome: Progressing   Problem: Education: Goal: Knowledge of General Education information will improve Description: Including pain rating scale, medication(s)/side effects and non-pharmacologic comfort measures Outcome: Progressing   Problem: Health Behavior/Discharge Planning: Goal: Ability to manage health-related needs will improve Outcome: Progressing   Problem: Clinical Measurements: Goal: Ability to maintain clinical measurements within normal limits will improve Outcome: Progressing Goal: Will remain free from infection Outcome: Progressing Goal: Diagnostic test results will improve Outcome: Progressing Goal: Respiratory complications will improve Outcome: Progressing Goal: Cardiovascular complication will be avoided Outcome: Progressing   Problem: Activity: Goal: Risk for activity intolerance will decrease Outcome: Progressing   Problem: Nutrition: Goal: Adequate nutrition will be maintained Outcome: Progressing   Problem: Coping: Goal: Level of anxiety will decrease Outcome: Progressing   Problem: Elimination: Goal: Will not experience complications related to bowel motility Outcome: Progressing Goal: Will not experience complications related to urinary retention Outcome: Progressing   Problem: Pain Managment: Goal: General experience of comfort will improve Outcome: Progressing   Problem: Safety: Goal: Ability to remain free from injury will improve Outcome: Progressing    Problem: Skin Integrity: Goal: Risk for impaired skin integrity will decrease Outcome: Progressing   Problem: Education: Goal: Ability to demonstrate management of disease process will improve Outcome: Progressing Goal: Ability to verbalize understanding of medication therapies will improve Outcome: Progressing Goal: Individualized Educational Video(s) Outcome: Progressing   Problem: Activity: Goal: Capacity to carry out activities will improve Outcome: Progressing   Problem: Cardiac: Goal: Ability to achieve and maintain adequate cardiopulmonary perfusion will improve Outcome: Progressing

## 2021-11-14 ENCOUNTER — Inpatient Hospital Stay (HOSPITAL_COMMUNITY): Payer: Medicare (Managed Care) | Admitting: Anesthesiology

## 2021-11-14 ENCOUNTER — Encounter (HOSPITAL_COMMUNITY): Payer: Self-pay | Admitting: Internal Medicine

## 2021-11-14 ENCOUNTER — Ambulatory Visit (HOSPITAL_BASED_OUTPATIENT_CLINIC_OR_DEPARTMENT_OTHER): Payer: Medicaid Other | Admitting: Family

## 2021-11-14 ENCOUNTER — Encounter (HOSPITAL_COMMUNITY)
Admission: EM | Disposition: A | Discharge status: Home Health | Payer: Self-pay | Source: Home / Self Care | Attending: Internal Medicine

## 2021-11-14 DIAGNOSIS — K920 Hematemesis: Secondary | ICD-10-CM

## 2021-11-14 DIAGNOSIS — K625 Hemorrhage of anus and rectum: Secondary | ICD-10-CM

## 2021-11-14 DIAGNOSIS — K921 Melena: Secondary | ICD-10-CM

## 2021-11-14 DIAGNOSIS — K922 Gastrointestinal hemorrhage, unspecified: Secondary | ICD-10-CM | POA: Diagnosis not present

## 2021-11-14 DIAGNOSIS — K264 Chronic or unspecified duodenal ulcer with hemorrhage: Secondary | ICD-10-CM | POA: Diagnosis not present

## 2021-11-14 HISTORY — PX: ESOPHAGOGASTRODUODENOSCOPY (EGD) WITH PROPOFOL: SHX5813

## 2021-11-14 LAB — BPAM RBC
Blood Product Expiration Date: 202302102359
Blood Product Expiration Date: 202302192359
Blood Product Expiration Date: 202302212359
ISSUE DATE / TIME: 202301281549
ISSUE DATE / TIME: 202301282142
ISSUE DATE / TIME: 202301290825
Unit Type and Rh: 5100
Unit Type and Rh: 7300
Unit Type and Rh: 7300

## 2021-11-14 LAB — POCT I-STAT, CHEM 8
BUN: 34 mg/dL — ABNORMAL HIGH (ref 6–20)
Calcium, Ion: 0.97 mmol/L — ABNORMAL LOW (ref 1.15–1.40)
Chloride: 94 mmol/L — ABNORMAL LOW (ref 98–111)
Creatinine, Ser: 3.9 mg/dL — ABNORMAL HIGH (ref 0.61–1.24)
Glucose, Bld: 67 mg/dL — ABNORMAL LOW (ref 70–99)
HCT: 27 % — ABNORMAL LOW (ref 39.0–52.0)
Hemoglobin: 9.2 g/dL — ABNORMAL LOW (ref 13.0–17.0)
Potassium: 4.1 mmol/L (ref 3.5–5.1)
Sodium: 133 mmol/L — ABNORMAL LOW (ref 135–145)
TCO2: 29 mmol/L (ref 22–32)

## 2021-11-14 LAB — CBC
HCT: 23.7 % — ABNORMAL LOW (ref 39.0–52.0)
Hemoglobin: 7.8 g/dL — ABNORMAL LOW (ref 13.0–17.0)
MCH: 28.5 pg (ref 26.0–34.0)
MCHC: 32.9 g/dL (ref 30.0–36.0)
MCV: 86.5 fL (ref 80.0–100.0)
Platelets: 262 10*3/uL (ref 150–400)
RBC: 2.74 MIL/uL — ABNORMAL LOW (ref 4.22–5.81)
RDW: 17.7 % — ABNORMAL HIGH (ref 11.5–15.5)
WBC: 10.8 10*3/uL — ABNORMAL HIGH (ref 4.0–10.5)
nRBC: 0 % (ref 0.0–0.2)

## 2021-11-14 LAB — TYPE AND SCREEN
ABO/RH(D): B POS
Antibody Screen: NEGATIVE
Unit division: 0
Unit division: 0
Unit division: 0

## 2021-11-14 LAB — PREPARE FRESH FROZEN PLASMA: Unit division: 0

## 2021-11-14 LAB — GLUCOSE, CAPILLARY
Glucose-Capillary: 70 mg/dL (ref 70–99)
Glucose-Capillary: 63 mg/dL — ABNORMAL LOW (ref 70–99)
Glucose-Capillary: 84 mg/dL (ref 70–99)
Glucose-Capillary: 79 mg/dL (ref 70–99)
Glucose-Capillary: 142 mg/dL — ABNORMAL HIGH (ref 70–99)

## 2021-11-14 LAB — BPAM FFP
Blood Product Expiration Date: 202302022359
ISSUE DATE / TIME: 202301290217
Unit Type and Rh: 7300

## 2021-11-14 LAB — RENAL FUNCTION PANEL
Albumin: 2.1 g/dL — ABNORMAL LOW (ref 3.5–5.0)
Anion gap: 10 (ref 5–15)
BUN: 35 mg/dL — ABNORMAL HIGH (ref 6–20)
CO2: 27 mmol/L (ref 22–32)
Calcium: 7.7 mg/dL — ABNORMAL LOW (ref 8.9–10.3)
Chloride: 94 mmol/L — ABNORMAL LOW (ref 98–111)
Creatinine, Ser: 3.91 mg/dL — ABNORMAL HIGH (ref 0.61–1.24)
GFR, Estimated: 17 mL/min — ABNORMAL LOW (ref >60)
Glucose, Bld: 80 mg/dL (ref 70–99)
Phosphorus: 3.6 mg/dL (ref 2.5–4.6)
Potassium: 3.5 mmol/L (ref 3.5–5.1)
Sodium: 131 mmol/L — ABNORMAL LOW (ref 135–145)

## 2021-11-14 LAB — HEPATITIS B SURFACE ANTIBODY,QUALITATIVE: Hep B S Ab: REACTIVE — AB

## 2021-11-14 SURGERY — ESOPHAGOGASTRODUODENOSCOPY (EGD) WITH PROPOFOL
Anesthesia: Monitor Anesthesia Care

## 2021-11-14 MED ORDER — DEXTROSE 50 % IV SOLN
INTRAVENOUS | Status: AC
  Filled 2021-11-14: qty 50

## 2021-11-14 MED ORDER — DEXTROSE 50 % IV SOLN
12.5000 g | INTRAVENOUS | Status: AC
  Administered 2021-11-14: 12.5 g via INTRAVENOUS

## 2021-11-14 MED ORDER — PROPOFOL 500 MG/50ML IV EMUL
INTRAVENOUS | Status: DC | PRN
  Administered 2021-11-14: 100 ug/kg/min via INTRAVENOUS

## 2021-11-14 MED ORDER — IPRATROPIUM-ALBUTEROL 0.5-2.5 (3) MG/3ML IN SOLN
RESPIRATORY_TRACT | Status: AC
  Filled 2021-11-14: qty 3

## 2021-11-14 MED ORDER — PROPOFOL 10 MG/ML IV BOLUS
INTRAVENOUS | Status: DC | PRN
  Administered 2021-11-14: 10 mg via INTRAVENOUS
  Administered 2021-11-14: 20 mg via INTRAVENOUS
  Administered 2021-11-14: 10 mg via INTRAVENOUS
  Administered 2021-11-14: 20 mg via INTRAVENOUS

## 2021-11-14 MED ORDER — SODIUM CHLORIDE 0.9 % IV SOLN: INTRAVENOUS | Status: DC | PRN

## 2021-11-14 MED ORDER — DEXTROSE 50 % IV SOLN
12.5000 g | INTRAVENOUS | Status: AC
Start: 2021-11-14 — End: 2021-11-14
  Administered 2021-11-14: 12.5 g via INTRAVENOUS

## 2021-11-14 MED ORDER — DEXTROSE 50 % IV SOLN
25.0000 mL | Freq: Once | INTRAVENOUS | Status: AC
  Administered 2021-11-14: 25 mL via INTRAVENOUS

## 2021-11-14 SURGICAL SUPPLY — 15 items

## 2021-11-14 NOTE — Progress Notes (Addendum)
Hypoglycemic Event  CBG: 63  Treatment: D50 25 mL (12.5 gm)  Symptoms: Hungry  Follow-up CBG: [id number] CBG Result:84  Possible Reasons for Event: Inadequate meal intake  Comments/MD notified:Stolzfus, MD    Cheri Guppy

## 2021-11-14 NOTE — Progress Notes (Signed)
Patient ID: Timothy [name], male   DOB: Mar 19, 1961, 61 y.o.   MRN: [id number] [facility] Progress Note   Assessment/ Plan:   1.  Recurrent GI bleeding: Underwent EGD earlier today that showed normal esophagus, small hiatal hernia and nonbleeding DU with an adherent clot-recommendations noted for PPI/sucralfate use and advancement of diet starting tomorrow. 2. ESRD: On hemodialysis on a TTS schedule and on schedule for hemodialysis again today due to significant volume excess noted on admission.  Elective hemodialysis again tomorrow to resume outpatient dialysis schedule. 3. Anemia: This is secondary to underlying end-stage renal disease/chronic illness and compounded by acute blood loss anemia from GI bleed.  Status post 3 units PRBC transfusion/FFP with appropriate improvement of hemoglobin and hematocrit.  Will restart max dose ESA. 4. CKD-MBD: Elevated phosphorus level noted, continue Renvela with binders when resumed back on diet. 5. Nutrition: Currently on clear liquids status post EGD with plans to advance diet tomorrow if he does not have recurrent GI bleed. 6. Hypertension: Blood pressures currently acceptable, continue to monitor.  Subjective:   Underwent EGD earlier today that did not show active bleeding but showed evidence of recent bleed from duodenal ulcer.  He voices his disapproval of remaining on a clear liquid diet.   Objective:   BP 138/89 (BP Location: Right Arm)    Pulse 85    Temp 98 F (36.7 C) (Oral)    Resp 17    Ht 6\' 1"  (1.854 m)    Wt 70.4 kg    SpO2 100%    BMI 20.48 kg/m   Physical Exam: Gen: Appears comfortable sleeping in bed, awakens to calling his voice to engage in conversation CVS: Pulse regular rhythm, normal rate, S1 and S2 normal Resp: Diminished breath sounds over bases-poor inspiratory effort, no rales/rhonchi Abd: Soft, obese, nontender, bowel sounds normal Ext: No lower extremity edema, left upper arm AV fistula with palpable  thrill  Labs: BMET Recent Labs  Lab 11/08/21 0421 11/09/21 0434 11/12/21 1501 11/12/21 1526 11/13/21 0113 11/14/21 0745  NA 129* 128* 138 136 132* 133*  K 5.3* 4.6 4.1 4.0 4.2 4.1  CL 91* 90* 98 96* 92* 94*  CO2 17* 21* 27  --  25  --   GLUCOSE 90 93 130* 122* 83 67*  BUN 154* 89* 41* 40* 53* 34*  CREATININE 8.55* 6.52* 4.68* 4.70* 5.02* 3.90*  CALCIUM 7.7* 8.1* 7.7*  --  7.7*  --   PHOS 6.4* 6.5*  --   --   --   --    CBC Recent Labs  Lab 11/08/21 0421 11/09/21 0434 11/12/21 1501 11/12/21 1526 11/13/21 0113 11/13/21 0645 11/13/21 1507 11/13/21 2024 11/14/21 0745 11/14/21 1015  WBC 14.8* 11.7* 21.2*  --  10.5  --   --   --   --  10.8*  NEUTROABS 10.8* 7.8*  --   --   --   --   --   --   --   --   HGB 8.1* 7.9* 5.1*   < > 6.1*   < > 7.6* 7.3* 9.2* 7.8*  HCT 23.9* 23.4* 15.7*   < > 18.3*   < > 22.0* 21.3* 27.0* 23.7*  MCV 83.0 84.2 88.2  --  85.5  --   --   --   --  86.5  PLT 235 229 284  --  232  --   --   --   --  262   < > = values  in this interval not displayed.     Medications:     Chlorhexidine Gluconate Cloth  6 each Topical Q0600   doxercalciferol  2 mcg Intravenous Q T,Th,Sa-HD   sevelamer carbonate  1,600 mg Oral TID with meals   Elmarie Shiley, MD 11/14/2021, 10:57 AM

## 2021-11-14 NOTE — Op Note (Signed)
[facility] Patient Name: Timothy [name] Procedure Date : 11/14/2021 MRN: [id number] Attending MD: Georgian Co ,  Date of Birth: 08/05/1961 CSN: [id number] Age: 61 Admit Type: Inpatient Procedure:                Upper GI endoscopy Indications:              Hematemesis, Hematochezia Providers:                Jeanella Cara, RN, Princess Bruins, RN, Frazier Richards, Technician, Lekisha Mcghee "Timothy [name] Referring MD:             Hospitalist team Medicines:                Monitored Anesthesia Care Complications:            No immediate complications. Estimated Blood Loss:     Estimated blood loss was minimal. Procedure:                Pre-Anesthesia Assessment:                           - Prior to the procedure, a History and Physical                            was performed, and patient medications and                            allergies were reviewed. The patient's tolerance of                            previous anesthesia was also reviewed. The risks                            and benefits of the procedure and the sedation                            options and risks were discussed with the patient.                            All questions were answered, and informed consent                            was obtained. Prior Anticoagulants: The patient has                            taken no previous anticoagulant or antiplatelet                            agents. ASA Grade Assessment: III - A patient with                            severe systemic disease. After reviewing the risks  and benefits, the patient was deemed in                            satisfactory condition to undergo the procedure.                           After obtaining informed consent, the endoscope was                            passed under direct vision. Throughout the                            procedure, the patient's blood pressure, pulse,  and                            oxygen saturations were monitored continuously. The                            [id number]-[id number] ([id number]) Olympus endoscope was introduced                            through the mouth, and advanced to the second part                            of duodenum. The upper GI endoscopy was                            accomplished without difficulty. The patient                            tolerated the procedure well. Scope In: Scope Out: Findings:      The examined esophagus was normal.      A small hiatal hernia was present.      Localized erythematous mucosa without bleeding was found in the gastric       antrum.      One non-bleeding cratered duodenal ulcer with an adherent clot (Forrest       Class IIb) was found in the duodenal bulb. The lesion was 20 mm in       largest dimension. Impression:               - Normal esophagus.                           - Small hiatal hernia.                           - Erythematous mucosa in the antrum.                           - Non-bleeding duodenal ulcer with an adherent clot                            (Forrest Class IIb).                           -  No specimens collected. Recommendation:           - Return patient to hospital ward for ongoing care.                           - No active bleeding was seen on today's                            examination.                           - Use a proton pump inhibitor PO BID for 8 weeks.                           - Use sucralfate suspension 1 gram PO QID for 1                            week.                           - CLD for today, advance diet tomorrow if no                            obvious sources of GI bleeding present.                           - The findings and recommendations were discussed                            with the patient and/or primary team. Procedure Code(s):        --- Professional ---                           [phone], Esophagogastroduodenoscopy, flexible,                             transoral; diagnostic, including collection of                            specimen(s) by brushing or washing, when performed                            (separate procedure) Diagnosis Code(s):        --- Professional ---                           K44.9, Diaphragmatic hernia without obstruction or                            gangrene                           K31.89, Other diseases of stomach and duodenum                           K26.4, Chronic or unspecified duodenal ulcer with  hemorrhage                           K92.0, Hematemesis                           K92.1, Melena (includes Hematochezia) CPT copyright 2019 American Medical Association. All rights reserved. The codes documented in this report are preliminary and upon coder review may  be revised to meet current compliance requirements. Timothy [name] "Timothy [name],  11/14/2021 8:24:16 AM Number of Addenda: 0

## 2021-11-14 NOTE — Transfer of Care (Signed)
Immediate Anesthesia Transfer of Care Note  Patient: Timothy [name]  Procedure(s) Performed: ESOPHAGOGASTRODUODENOSCOPY (EGD) WITH PROPOFOL  Patient Location: Endoscopy Unit  Anesthesia Type:MAC  Level of Consciousness: awake and alert   Airway & Oxygen Therapy: Patient Spontanous Breathing  Post-op Assessment: Report given to RN, Post -op Vital signs reviewed and stable and Patient moving all extremities X 4  Post vital signs: Reviewed and stable  Last Vitals:  Vitals Value Taken Time  BP 162/78   Temp    Pulse 85 11/14/21 0818  Resp 20 11/14/21 0818  SpO2 95 % 11/14/21 0818  Vitals shown include unvalidated device data.  Last Pain:  Vitals:   11/14/21 0732  TempSrc: Temporal  PainSc: 0-No pain         Complications: No notable events documented.

## 2021-11-14 NOTE — Progress Notes (Signed)
Pt arrived back on floor with no report

## 2021-11-14 NOTE — Progress Notes (Signed)
Patient ID: Timothy [name], male   DOB: 04-Dec-1960, 61 y.o.   MRN: [id number]  Patient currently in endoscopy Hgb 9.2 today on iSTAT  Would exhaust all other non-surgical interventions if he rebleeds.  Poor surgical candidate with his heart failure, renal failure, pericardial effusion.    Please call us back as needed.  Imogene Burn. Georgette Dover, MD, [facility] Surgery  General Surgery   11/14/2021 8:15 AM

## 2021-11-14 NOTE — Interval H&P Note (Signed)
History and Physical Interval Note:  11/14/2021 7:46 AM  Timothy [name]  has presented today for surgery, with the diagnosis of GI bleed.  The various methods of treatment have been discussed with the patient and family. After consideration of risks, benefits and other options for treatment, the patient has consented to  Procedure(s): ESOPHAGOGASTRODUODENOSCOPY (EGD) WITH PROPOFOL (N/A) as a surgical intervention.  The patient's history has been reviewed, patient examined, no change in status, stable for surgery.  I have reviewed the patient's chart and labs.  Questions were answered to the patient's satisfaction.     [name]

## 2021-11-14 NOTE — Progress Notes (Signed)
PROGRESS NOTE  Timothy [name] NTI:[id number] DOB: Oct 18, 1960 DOA: 11/12/2021 PCP: [name], MD  HPI/Recap of past 24 hours: Timothy [name] is a 61 y.o. 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   Patient was found by family member on bathroom floor with large quantity of rectal bleed and called EMS.  EMS arrived and estimated blood loss about 400 ml. Work-up revealed GI bleed with symptomatic anemia with hemoglobin of 5.1 on presentation.  Patient received 3 unit PRBCs.  Nephrology, GI consulted.  Plan for hemodialysis on 11/13/2021.  And EGD on 11/14/2021.  11/14/2021: Seen at bedside after EGD.  No acute distress.  He tells me he wants to eat solid foods.  No abdominal pain.  Currently on clear liquid diet as recommended by GI, post EGD.  Assessment/Plan: Principal Problem:   GI bleed Active Problems:   Duodenal ulcer with hemorrhage   Rectal bleeding  Acute blood loss anemia secondary to recurrent GI bleed, suspect upper GI bleed. -Likely recurrent duodenum bulb ulcer bleed Presented with hemoglobin of 5.1 with symptomatic anemia. 3 units PRBC ordered to be transfused. Hemoglobin 7.8 on 11/04/2021. -CTA not showing any active bleeding lesions for IR intervention, on PPI drip.  Post EGD on 11/14/2021.  Per GI, EGD showed duodenal ulcer that had overlying clot but no active signs of bleeding. This may have bled but then stopped, or he may have had an alternative source of Hb drop. Rec proton pump inhibitor PO BID for 8 weeks and sucralfate suspension 1 gram PO QID for 1 week. CLD for today, advance diet tomorrow if no obvious sources of recurrent GI bleeding Also seen by general surgery, signed off 1/30. If any evidence of overt bleeding recurs, repeat CTA  abdomen.  Duodenal ulcer without active bleeding Management as stated above as recorded by GI  Resolved acute hypoxic respiratory failure secondary to mild pulm edema seen on chest x-ray. Personally reviewed chest x-ray done on 11/13/2021 showing increase in pulmonary vascularity suggestive of mild pulmonary edema. Not on supplementation at baseline. Post HD.  ESRD on HD TTS Last outpatient hemodialysis 11/12/2021. Appreciate nephrology's assistance, possible hemodialysis on 11/13/2021.   Coagulopathy -INR elevated on last 2 admissions INR 1.5 on 11/13/2021. LFTs within normal limit.   Question of hemochromatosis -Outpatient follow-up with hematology   History of pericardial effusion -Small amount on recent echo, consider recheck echo if after medically stable still having hypoxia   HTN -Hold all BP meds due to suspected active GI bleed. Maintain MAP greater than 65. Not on IV fluid hydration due to ESRD.      DVT prophylaxis: SCD, pharmacological DVT prophylaxis is contraindicated in the setting of suspected GI bleed Code Status: Full code Family Communication: None at bedside. Disposition Plan: Likely will discharge to home on 11/15/2021 if no recurrent GI bleeding. Consults called: GI, general surgery, nephrology. Admission status: Inpatient status.      Status is: Inpatient  Patient will require at least 2 midnights for further evaluation and treatment of present condition.      Objective: Vitals:   11/14/21 0857 11/14/21 0907 11/14/21 0954 11/14/21 1024  BP: (!) 140/91 (!) 155/100 (!) 148/95 138/89  Pulse: 80 79 80 85  Resp: 15 13 16 17   Temp:   98 F (36.7 C)   TempSrc:  Oral   SpO2: 100% 100% 99% 100%  Weight:      Height:        Intake/Output Summary (Last 24 hours) at 11/14/2021 1235 Last data filed at 11/14/2021 0204 Gross per 24 hour  Intake 939.99 ml  Output 4000 ml  Net -3060.01 ml   Filed Weights   11/13/21 2234 11/14/21 0204  Weight:  74.4 kg 70.4 kg    Exam:  General: 61 y.o. year-old male well-developed well-nourished in no acute distress.  He is alert and oriented x3.   Cardiovascular: Regular rate and rhythm no rubs or gallops. Respiratory: Clear to auscultation no wheezes or rales.  Poor inspiratory effort. Abdomen: Soft nontender normal bowel sounds present. Musculoskeletal: No lower extremity edema bilaterally. Skin: No ulcerative lesions noted. Psychiatry: Mood is appropriate for condition and setting. Neuro: Nonfocal exam.  Moves all 4 extremities   Data Reviewed: CBC: Recent Labs  Lab 11/08/21 0421 11/09/21 0434 11/12/21 1501 11/12/21 1526 11/13/21 0113 11/13/21 0645 11/13/21 1507 11/13/21 2024 11/14/21 0745 11/14/21 1015  WBC 14.8* 11.7* 21.2*  --  10.5  --   --   --   --  10.8*  NEUTROABS 10.8* 7.8*  --   --   --   --   --   --   --   --   HGB 8.1* 7.9* 5.1*   < > 6.1* 6.1* 7.6* 7.3* 9.2* 7.8*  HCT 23.9* 23.4* 15.7*   < > 18.3* 18.3* 22.0* 21.3* 27.0* 23.7*  MCV 83.0 84.2 88.2  --  85.5  --   --   --   --  86.5  PLT 235 229 284  --  232  --   --   --   --  262   < > = values in this interval not displayed.   Basic Metabolic Panel: Recent Labs  Lab 11/08/21 0421 11/09/21 0434 11/12/21 1501 11/12/21 1526 11/13/21 0113 11/14/21 0745 11/14/21 1015  NA 129* 128* 138 136 132* 133* 131*  K 5.3* 4.6 4.1 4.0 4.2 4.1 3.5  CL 91* 90* 98 96* 92* 94* 94*  CO2 17* 21* 27  --  25  --  27  GLUCOSE 90 93 130* 122* 83 67* 80  BUN 154* 89* 41* 40* 53* 34* 35*  CREATININE 8.55* 6.52* 4.68* 4.70* 5.02* 3.90* 3.91*  CALCIUM 7.7* 8.1* 7.7*  --  7.7*  --  7.7*  PHOS 6.4* 6.5*  --   --   --   --  3.6   GFR: Estimated Creatinine Clearance: 20 mL/min (A) (by C-G formula based on SCr of 3.91 mg/dL (H)). Liver Function Tests: Recent Labs  Lab 11/08/21 0421 11/09/21 0434 11/12/21 1501 11/14/21 1015  AST  --   --  22  --   ALT  --   --  27  --   ALKPHOS  --   --  55  --   BILITOT  --   --  0.5  --    PROT  --   --  5.6*  --   ALBUMIN 2.5* 2.4* 2.0* 2.1*   No results for input(s): LIPASE, AMYLASE in the last 168 hours. No results for input(s): AMMONIA in the last 168 hours. Coagulation Profile: Recent Labs  Lab 11/13/21 0113  INR 1.5*   Cardiac Enzymes: No results for input(s): CKTOTAL, CKMB, CKMBINDEX, TROPONINI in the last 168 hours. BNP (last 3 results) No results for input(s): PROBNP in the last 8760 hours.  HbA1C: No results for input(s): HGBA1C in the last 72 hours. CBG: Recent Labs  Lab 11/14/21 0819 11/14/21 0850 11/14/21 0916 11/14/21 1014 11/14/21 1149  GLUCAP 70 63* 84 79 142*   Lipid Profile: No results for input(s): CHOL, HDL, LDLCALC, TRIG, CHOLHDL, LDLDIRECT in the last 72 hours. Thyroid Function Tests: No results for input(s): TSH, T4TOTAL, FREET4, T3FREE, THYROIDAB in the last 72 hours. Anemia Panel: No results for input(s): VITAMINB12, FOLATE, FERRITIN, TIBC, IRON, RETICCTPCT in the last 72 hours. Urine analysis:    Component Value Date/Time   COLORURINE YELLOW 05/19/2021 2015   APPEARANCEUR HAZY (A) 05/19/2021 2015   LABSPEC 1.012 05/19/2021 2015   PHURINE 9.0 (H) 05/19/2021 2015   GLUCOSEU NEGATIVE 05/19/2021 2015   HGBUR NEGATIVE 05/19/2021 2015   [place] NEGATIVE 05/19/2021 2015   KETONESUR NEGATIVE 05/19/2021 2015   PROTEINUR >=300 (A) 05/19/2021 2015   NITRITE NEGATIVE 05/19/2021 2015   LEUKOCYTESUR SMALL (A) 05/19/2021 2015   Sepsis Labs: @LABRCNTIP (procalcitonin:4,lacticidven:4)  ) Recent Results (from the past 240 hour(s))  Resp Panel by RT-PCR (Flu A&B, Covid) Nasopharyngeal Swab     Status: None   Collection Time: 11/04/21 11:51 PM   Specimen: Nasopharyngeal Swab; Nasopharyngeal(NP) swabs in vial transport medium  Result Value Ref Range Status   SARS Coronavirus 2 by RT PCR NEGATIVE NEGATIVE Final    Comment: (NOTE) SARS-CoV-2 target nucleic acids are NOT DETECTED.  The SARS-CoV-2 RNA is generally detectable in upper  respiratory specimens during the acute phase of infection. The lowest concentration of SARS-CoV-2 viral copies this assay can detect is 138 copies/mL. A negative result does not preclude SARS-Cov-2 infection and should not be used as the sole basis for treatment or other patient management decisions. A negative result may occur with  improper specimen collection/handling, submission of specimen other than nasopharyngeal swab, presence of viral mutation(s) within the areas targeted by this assay, and inadequate number of viral copies(<138 copies/mL). A negative result must be combined with clinical observations, patient history, and epidemiological information. The expected result is Negative.  Fact Sheet for Patients:  EntrepreneurPulse.com.au  Fact Sheet for Healthcare Providers:  IncredibleEmployment.be  This test is no t yet approved or cleared by the [place] FDA and  has been authorized for detection and/or diagnosis of SARS-CoV-2 by FDA under an Emergency Use Authorization (EUA). This EUA will remain  in effect (meaning this test can be used) for the duration of the COVID-19 declaration under Section 564(b)(1) of the Act, 21 U.S.C.section 360bbb-3(b)(1), unless the authorization is terminated  or revoked sooner.       Influenza A by PCR NEGATIVE NEGATIVE Final   Influenza B by PCR NEGATIVE NEGATIVE Final    Comment: (NOTE) The Xpert Xpress SARS-CoV-2/FLU/RSV plus assay is intended as an aid in the diagnosis of influenza from Nasopharyngeal swab specimens and should not be used as a sole basis for treatment. Nasal washings and aspirates are unacceptable for Xpert Xpress SARS-CoV-2/FLU/RSV testing.  Fact Sheet for Patients: EntrepreneurPulse.com.au  Fact Sheet for Healthcare Providers: IncredibleEmployment.be  This test is not yet approved or cleared by the [place] FDA and has been  authorized for detection and/or diagnosis of SARS-CoV-2 by FDA under an Emergency Use Authorization (EUA). This EUA will remain in effect (meaning this test can be used) for the duration of the COVID-19 declaration under Section 564(b)(1) of the Act, 21 U.S.C. section 360bbb-3(b)(1), unless the authorization is terminated or revoked.  Performed at [facility], [place] [address],  [place], [place] [address]   MRSA Next Gen by PCR, Nasal     Status: None   Collection Time: 11/05/21  9:38 AM   Specimen: Nasal Mucosa; Nasal Swab  Result Value Ref Range Status   MRSA by PCR Next Gen NOT DETECTED NOT DETECTED Final    Comment: (NOTE) The GeneXpert MRSA Assay (FDA approved for NASAL specimens only), is one component of a comprehensive MRSA colonization surveillance program. It is not intended to diagnose MRSA infection nor to guide or monitor treatment for MRSA infections. Test performance is not FDA approved in patients less than 78 years old. Performed at [facility], [place] [address] [place], [place] [address]   Resp Panel by RT-PCR (Flu A&B, Covid) Nasopharyngeal Swab     Status: None   Collection Time: 11/12/21  4:22 PM   Specimen: Nasopharyngeal Swab; Nasopharyngeal(NP) swabs in vial transport medium  Result Value Ref Range Status   SARS Coronavirus 2 by RT PCR NEGATIVE NEGATIVE Final    Comment: (NOTE) SARS-CoV-2 target nucleic acids are NOT DETECTED.  The SARS-CoV-2 RNA is generally detectable in upper respiratory specimens during the acute phase of infection. The lowest concentration of SARS-CoV-2 viral copies this assay can detect is 138 copies/mL. A negative result does not preclude SARS-Cov-2 infection and should not be used as the sole basis for treatment or other patient management decisions. A negative result may occur with  improper specimen collection/handling, submission of specimen other than nasopharyngeal swab, presence of viral mutation(s) within  the areas targeted by this assay, and inadequate number of viral copies(<138 copies/mL). A negative result must be combined with clinical observations, patient history, and epidemiological information. The expected result is Negative.  Fact Sheet for Patients:  EntrepreneurPulse.com.au  Fact Sheet for Healthcare Providers:  IncredibleEmployment.be  This test is no t yet approved or cleared by the [place] FDA and  has been authorized for detection and/or diagnosis of SARS-CoV-2 by FDA under an Emergency Use Authorization (EUA). This EUA will remain  in effect (meaning this test can be used) for the duration of the COVID-19 declaration under Section 564(b)(1) of the Act, 21 U.S.C.section 360bbb-3(b)(1), unless the authorization is terminated  or revoked sooner.       Influenza A by PCR NEGATIVE NEGATIVE Final   Influenza B by PCR NEGATIVE NEGATIVE Final    Comment: (NOTE) The Xpert Xpress SARS-CoV-2/FLU/RSV plus assay is intended as an aid in the diagnosis of influenza from Nasopharyngeal swab specimens and should not be used as a sole basis for treatment. Nasal washings and aspirates are unacceptable for Xpert Xpress SARS-CoV-2/FLU/RSV testing.  Fact Sheet for Patients: EntrepreneurPulse.com.au  Fact Sheet for Healthcare Providers: IncredibleEmployment.be  This test is not yet approved or cleared by the [place] FDA and has been authorized for detection and/or diagnosis of SARS-CoV-2 by FDA under an Emergency Use Authorization (EUA). This EUA will remain in effect (meaning this test can be used) for the duration of the COVID-19 declaration under Section 564(b)(1) of the Act, 21 U.S.C. section 360bbb-3(b)(1), unless the authorization is terminated or revoked.  Performed at [facility], [place] [address], [place], [place] [address]       Studies: No results found.  Scheduled Meds:   Chlorhexidine Gluconate Cloth  6 each Topical Q0600   doxercalciferol  2 mcg Intravenous Q T,Th,Sa-HD   sevelamer carbonate  1,600 mg Oral TID with meals    Continuous Infusions:  sodium chloride     sodium  chloride     pantoprazole 8 mg/hr (11/14/21 1017)   sodium chloride       LOS: 2 days     Kayleen Memos, MD [facility] Pager [phone]  If 7PM-7AM, please contact night-coverage [email or web address] Password [email or web address] 11/14/2021, 12:35 PM

## 2021-11-14 NOTE — Plan of Care (Signed)
°  Problem: Activity: Goal: Capacity to carry out activities will improve Outcome: Progressing   Problem: Pain Managment: Goal: General experience of comfort will improve Outcome: Progressing   Problem: Safety: Goal: Ability to remain free from injury will improve Outcome: Progressing

## 2021-11-14 NOTE — Progress Notes (Signed)
Heart Failure Navigator Progress Note  Assessed for Heart & Vascular TOC clinic readiness.  Patient does not meet criteria due to ESRD on HD TTS.   Navigator available for educational resources.   Pricilla Holm, MSN, RN Heart Failure Nurse Navigator [phone]

## 2021-11-14 NOTE — Anesthesia Procedure Notes (Signed)
Procedure Name: MAC Date/Time: 11/14/2021 7:55 AM Performed by: Harden Mo, CRNA Pre-anesthesia Checklist: Patient identified, Emergency Drugs available, Suction available and Patient being monitored Patient Re-evaluated:Patient Re-evaluated prior to induction Oxygen Delivery Method: Simple face mask Preoxygenation: Pre-oxygenation with 100% oxygen Induction Type: IV induction Placement Confirmation: positive ETCO2 and breath sounds checked- equal and bilateral Dental Injury: Teeth and Oropharynx as per pre-operative assessment

## 2021-11-14 NOTE — Progress Notes (Signed)
Hypoglycemic Event  CBG: 67  Treatment: D50 50 mL (25 gm)  Symptoms: None  Follow-up CBG: [id number] CBG Result:70  Possible Reasons for Event: Inadequate meal intake  Comments/MD notified: Rex Kras, MD and Lorenso Courier, [place]

## 2021-11-15 ENCOUNTER — Inpatient Hospital Stay (HOSPITAL_COMMUNITY): Payer: Medicare (Managed Care)

## 2021-11-15 DIAGNOSIS — K922 Gastrointestinal hemorrhage, unspecified: Secondary | ICD-10-CM | POA: Diagnosis not present

## 2021-11-15 DIAGNOSIS — K625 Hemorrhage of anus and rectum: Secondary | ICD-10-CM | POA: Diagnosis not present

## 2021-11-15 DIAGNOSIS — R578 Other shock: Secondary | ICD-10-CM | POA: Diagnosis not present

## 2021-11-15 DIAGNOSIS — K264 Chronic or unspecified duodenal ulcer with hemorrhage: Principal | ICD-10-CM

## 2021-11-15 LAB — CBC
HCT: 24.3 % — ABNORMAL LOW (ref 39.0–52.0)
Hemoglobin: 8.2 g/dL — ABNORMAL LOW (ref 13.0–17.0)
MCH: 28.8 pg (ref 26.0–34.0)
MCHC: 33.7 g/dL (ref 30.0–36.0)
MCV: 85.3 fL (ref 80.0–100.0)
Platelets: 284 10*3/uL (ref 150–400)
RBC: 2.85 MIL/uL — ABNORMAL LOW (ref 4.22–5.81)
RDW: 17.9 % — ABNORMAL HIGH (ref 11.5–15.5)
WBC: 11.5 10*3/uL — ABNORMAL HIGH (ref 4.0–10.5)
nRBC: 0 % (ref 0.0–0.2)

## 2021-11-15 LAB — GLUCOSE, CAPILLARY
Glucose-Capillary: 71 mg/dL (ref 70–99)
Glucose-Capillary: 71 mg/dL (ref 70–99)
Glucose-Capillary: 98 mg/dL (ref 70–99)
Glucose-Capillary: 89 mg/dL (ref 70–99)

## 2021-11-15 LAB — HEMOGLOBIN AND HEMATOCRIT, BLOOD
HCT: 18 % — ABNORMAL LOW (ref 39.0–52.0)
HCT: 18 % — ABNORMAL LOW (ref 39.0–52.0)
Hemoglobin: 6.1 g/dL — CL (ref 13.0–17.0)
Hemoglobin: 6.2 g/dL — CL (ref 13.0–17.0)

## 2021-11-15 LAB — RENAL FUNCTION PANEL
Albumin: 2.1 g/dL — ABNORMAL LOW (ref 3.5–5.0)
Anion gap: 13 (ref 5–15)
BUN: 21 mg/dL — ABNORMAL HIGH (ref 6–20)
CO2: 22 mmol/L (ref 22–32)
Calcium: 7.9 mg/dL — ABNORMAL LOW (ref 8.9–10.3)
Chloride: 95 mmol/L — ABNORMAL LOW (ref 98–111)
Creatinine, Ser: 3.03 mg/dL — ABNORMAL HIGH (ref 0.61–1.24)
GFR, Estimated: 23 mL/min — ABNORMAL LOW (ref >60)
Glucose, Bld: 101 mg/dL — ABNORMAL HIGH (ref 70–99)
Phosphorus: 3.3 mg/dL (ref 2.5–4.6)
Potassium: 4 mmol/L (ref 3.5–5.1)
Sodium: 130 mmol/L — ABNORMAL LOW (ref 135–145)

## 2021-11-15 LAB — PREPARE RBC (CROSSMATCH)

## 2021-11-15 MED ORDER — PEG-KCL-NACL-NASULF-NA ASC-C 100 G PO SOLR
0.5000 | Freq: Once | ORAL | Status: AC
  Administered 2021-11-15: 100 g via ORAL
  Filled 2021-11-15: qty 1

## 2021-11-15 MED ORDER — PANTOPRAZOLE SODIUM 40 MG IV SOLR: 40.0000 mg | Freq: Two times a day (BID) | INTRAVENOUS | Status: DC

## 2021-11-15 MED ORDER — PANTOPRAZOLE SODIUM 40 MG PO TBEC
80.0000 mg | DELAYED_RELEASE_TABLET | ORAL | Status: AC
  Administered 2021-11-15: 80 mg via ORAL
  Filled 2021-11-15: qty 2

## 2021-11-15 MED ORDER — CHLORHEXIDINE GLUCONATE CLOTH 2 % EX PADS
6.0000 | MEDICATED_PAD | Freq: Every day | CUTANEOUS | Status: DC
  Administered 2021-11-15 – 2021-11-17 (×3): 6 via TOPICAL

## 2021-11-15 MED ORDER — PEG-KCL-NACL-NASULF-NA ASC-C 100 G PO SOLR: 1.0000 | Freq: Once | ORAL | Status: DC

## 2021-11-15 MED ORDER — SODIUM CHLORIDE 0.9% IV SOLUTION: Freq: Once | INTRAVENOUS | Status: AC

## 2021-11-15 MED ORDER — METOCLOPRAMIDE HCL 5 MG/ML IJ SOLN
10.0000 mg | Freq: Four times a day (QID) | INTRAMUSCULAR | Status: AC
  Administered 2021-11-15 (×2): 10 mg via INTRAVENOUS
  Filled 2021-11-15 (×2): qty 2

## 2021-11-15 MED ORDER — PANTOPRAZOLE 80MG IVPB - SIMPLE MED
80.0000 mg | Freq: Once | INTRAVENOUS | Status: DC
  Filled 2021-11-15 (×3): qty 100

## 2021-11-15 MED ORDER — PANTOPRAZOLE INFUSION (NEW) - SIMPLE MED
8.0000 mg/h | INTRAVENOUS | Status: DC
  Administered 2021-11-15 – 2021-11-16 (×3): 8 mg/h via INTRAVENOUS
  Filled 2021-11-15 (×3): qty 100
  Filled 2021-11-15: qty 80
  Filled 2021-11-15: qty 100
  Filled 2021-11-15: qty 80

## 2021-11-15 MED ORDER — IOHEXOL 350 MG/ML SOLN
80.0000 mL | Freq: Once | INTRAVENOUS | Status: AC | PRN
  Administered 2021-11-15: 80 mL via INTRAVENOUS

## 2021-11-15 NOTE — Progress Notes (Signed)
[facility] Progress Note   Subjective:   Had large bloody BM this AM and blood pressure dropped. Pt reports he felt dizzy and "delirious" but feels better now, wants to eat. Denies SOB, CP, dizziness, abdominal pain and nausea. Getting PRBC now.   Objective Vitals:   11/15/21 0850 11/15/21 0900 11/15/21 0945 11/15/21 1000  BP: 116/67 (!) 89/78 (!) 89/78 111/66  Pulse:  (!) 107 (!) 104 (!) 105  Resp: 12 17 13  (!) 9  Temp: 97.6 F (36.4 C) 98.5 F (36.9 C) 98.5 F (36.9 C) 98.4 F (36.9 C)  TempSrc: Oral Oral Oral Oral  SpO2: 97% 98% 98% 97%  Weight:      Height:       Physical Exam General: Alert and in NAD Heart: RRR, no murmurs, rubs or gallops Lungs: CTA bilaterally without wheezing, rhonchi or rales.  Abdomen: +BS, non-distended Extremities: No edema b/l lower extremities Dialysis Access: LUE AVF + t/b  Additional Objective Labs: Basic Metabolic Panel: Recent Labs  Lab 11/09/21 0434 11/12/21 1501 11/13/21 0113 11/14/21 0745 11/14/21 1015 11/15/21 0233  NA 128*   < > 132* 133* 131* 130*  K 4.6   < > 4.2 4.1 3.5 4.0  CL 90*   < > 92* 94* 94* 95*  CO2 21*   < > 25  --  27 22  GLUCOSE 93   < > 83 67* 80 101*  BUN 89*   < > 53* 34* 35* 21*  CREATININE 6.52*   < > 5.02* 3.90* 3.91* 3.03*  CALCIUM 8.1*   < > 7.7*  --  7.7* 7.9*  PHOS 6.5*  --   --   --  3.6 3.3   < > = values in this interval not displayed.   Liver Function Tests: Recent Labs  Lab 11/12/21 1501 11/14/21 1015 11/15/21 0233  AST 22  --   --   ALT 27  --   --   ALKPHOS 55  --   --   BILITOT 0.5  --   --   PROT 5.6*  --   --   ALBUMIN 2.0* 2.1* 2.1*   No results for input(s): LIPASE, AMYLASE in the last 168 hours. CBC: Recent Labs  Lab 11/09/21 0434 11/12/21 1501 11/12/21 1526 11/13/21 0113 11/13/21 0645 11/14/21 1015 11/15/21 0233 11/15/21 0805  WBC 11.7* 21.2*  --  10.5  --  10.8* 11.5*  --   NEUTROABS 7.8*  --   --   --   --   --   --   --   HGB 7.9* 5.1*   <  > 6.1*   < > 7.8* 8.2* 6.1*  HCT 23.4* 15.7*   < > 18.3*   < > 23.7* 24.3* 18.0*  MCV 84.2 88.2  --  85.5  --  86.5 85.3  --   PLT 229 284  --  232  --  262 284  --    < > = values in this interval not displayed.   Blood Culture    Component Value Date/Time   SDES PERITONEAL FLUID 06/22/2021 1236   SPECREQUEST ABDOMEN 06/22/2021 1236   CULT  06/22/2021 1236    NO GROWTH 3 DAYS Performed at [facility], [place] [address] [place], [place] [address]    REPTSTATUS 06/26/2021 FINAL 06/22/2021 1236    Cardiac Enzymes: No results for input(s): CKTOTAL, CKMB, CKMBINDEX, TROPONINI in the last 168 hours. CBG: Recent Labs  Lab  11/14/21 0850 11/14/21 0916 11/14/21 1014 11/14/21 1149 11/15/21 0745  GLUCAP 63* 84 79 142* 71   Iron Studies: No results for input(s): IRON, TIBC, TRANSFERRIN, FERRITIN in the last 72 hours. @lablastinr3 @ Studies/Results: CT ANGIO GI BLEED  Result Date: 11/15/2021 CLINICAL DATA:  Lower GI bleed. Rectal bleeding. End-stage renal disease on dialysis. Recent coil embolization of the gastroduodenal artery for duodenal bulb ulcer. EXAM: CTA ABDOMEN AND PELVIS WITHOUT AND WITH CONTRAST TECHNIQUE: Multidetector CT imaging of the abdomen and pelvis was performed using the standard protocol during bolus administration of intravenous contrast. Multiplanar reconstructed images and MIPs were obtained and reviewed to evaluate the vascular anatomy. RADIATION DOSE REDUCTION: This exam was performed according to the departmental dose-optimization program which includes automated exposure control, adjustment of the mA and/or kV according to patient size and/or use of iterative reconstruction technique. CONTRAST:  35mL OMNIPAQUE IOHEXOL 350 MG/ML SOLN COMPARISON:  11/12/2021 and 06/25/2021 FINDINGS: VASCULAR Aorta: Normal caliber of the abdominal aorta with atherosclerotic calcifications. Negative for dissection. No significant stenosis. Celiac: Patent without evidence of  aneurysm, dissection, vasculitis or significant stenosis. Coil embolization of the gastroduodenal artery. Common hepatic artery and proper hepatic artery are patent. SMA: Patent without evidence of aneurysm, dissection, vasculitis or significant stenosis. Renals: Both renal arteries are patent without evidence of aneurysm, dissection, vasculitis, fibromuscular dysplasia or significant stenosis. IMA: Patent without evidence of aneurysm, dissection, vasculitis or significant stenosis. Inflow: Patent without evidence of aneurysm, dissection, vasculitis or significant stenosis. Proximal Outflow: Proximal femoral arteries are patent bilaterally. Veins: IVC and proximal iliac veins are patent. Main portal venous system is patent. Review of the MIP images confirms the above findings. NON-VASCULAR Lower chest: Again noted is a focus of consolidation in the posterior right lower lobe that was present on the most recent examination but new since 11/05/2021. This could represent atelectasis versus a small focus of infection. Additional patchy densities in the left lower lobe. Again noted is a moderate amount of pericardial fluid. Hepatobiliary: Probable gallstone. There is no gallbladder distension. No focal liver lesion. No significant biliary dilatation. Pancreas: Chronic pancreatic duct dilatation. No evidence for acute pancreatic inflammation. Spleen: Again noted is low-density structure along the superior aspect of the spleen that measures roughly 2.0 cm and similar to the exam from 06/25/2021. There is no splenic enlargement. Adrenals/Urinary Tract: Normal appearance of the adrenal glands. Both kidneys are atrophic with numerous small cystic structures. Again noted is a hyperdense structure along the medial right kidney that measures 2.9 cm. No significant enhancement in this structure and this likely represents a hemorrhagic or proteinaceous cyst. No hydronephrosis. Urinary bladder is decompressed and poorly  characterized. Stomach/Bowel: Postoperative changes likely representing right hemicolectomy. Bowel anastomosis and postsurgical changes in the anterior abdomen. No clear evidence for active GI bleeding. Fluid-filled loops of bowel throughout the abdomen and pelvis. No evidence for an obstructive process. Lymphatic: No significant lymph node enlargement in the abdomen pelvis. Reproductive: Prostate is unremarkable. Other: Again noted is abdominal ascites. There has been redistribution of the ascites with more fluid along the left side of the abdomen and less fluid in the upper abdomen. Overall, the amount ascites may have decreased since 11/12/2021. Musculoskeletal: No acute bone abnormality. IMPRESSION: VASCULAR 1. No evidence for active GI bleeding. 2. Coil embolization of the gastroduodenal artery. 3. Aorta and main visceral arteries are patent. NON-VASCULAR 1. Ascites. Abdominal ascites may have slightly decreased since 11/12/2021 as described. 2. Small focus of consolidation in the posterior right lower lobe. Findings could  represent atelectasis versus small focus of infection. 3. Cholelithiasis.  No evidence for gallbladder distension. 4. Bilateral renal atrophy with numerous bilateral renal cysts. 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 Hyperdense non-enhancing structure in the right kidney probably represents a complex or hemorrhagic cyst. 5. Chronic dilatation of the main pancreatic duct. 6. Postoperative bowel changes. 7. Stable pericardial effusion. Electronically Signed   By: Markus Daft M.D.   On: 11/15/2021 09:13   Medications:  sodium chloride     sodium chloride     pantoprazole Stopped (11/15/21 1003)   pantoprazole 8 mg/hr (11/14/21 1828)   pantoprazole Stopped (11/15/21 1003)   sodium chloride      sodium chloride   Intravenous Once   Chlorhexidine Gluconate Cloth  6 each Topical Q0600   doxercalciferol  2 mcg Intravenous Q T,Th,Sa-HD    pantoprazole  80 mg Oral STAT   [START ON 11/18/2021] pantoprazole  40 mg Intravenous Q12H   sevelamer carbonate  1,600 mg Oral TID with meals     Assessment/Plan: 1.  Recurrent GI bleeding: Underwent EGD earlier yesterday that showed normal esophagus, small hiatal hernia and nonbleeding DU with an adherent clot-recommendations noted for PPI/sucralfate use. Unfortunately bled again this AM. Per primary team/GI 2. ESRD: On hemodialysis on a TTS schedule. Had extra HD yesterday with net UF 2L. No emergent indications for HD today and doubt we would achieve much UF with current BP. Will plan for HD tomorrow, then resume TTS schedule.  3. Anemia: This is secondary to underlying end-stage renal disease/chronic illness and compounded by acute blood loss anemia from GI bleed.  Status post 3 units PRBC transfusion/FFP and getting additional PRBC today. Will restart max dose ESA. 4. CKD-MBD: Elevated phosphorus level noted, continue Renvela with binders when resumed back on diet. 5. Nutrition: Currently NPO 6. Hypertension: Blood pressures low due to GI bleed, follow post transfusion.   Anice Paganini, PA-C 11/15/2021, 10:22 AM  [facility] Pager: [phone]

## 2021-11-15 NOTE — Progress Notes (Signed)
PROGRESS NOTE  Timothy [name] POE:[id number] DOB: 1960/12/17 DOA: 11/12/2021 PCP: [name], MD  HPI/Recap of past 24 hours: Timothy [name] is a 61 y.o. 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   Patient was found by family member on bathroom floor with large quantity of rectal bleed and called EMS.  EMS arrived and estimated blood loss about 400 ml. Work-up revealed GI bleed with symptomatic anemia with hemoglobin of 5.1 on presentation.  Patient received 3 unit PRBCs.  Nephrology, GI consulted.  Plan for hemodialysis on 11/13/2021.  EGD on 11/14/2021 revealed duodenal ulcer that had overlying clot but no active signs of bleeding.   11/15/2021: Patient was found to have large bright red blood per rectum.  Patient made NPO.  Protonix drip restarted, stat CT angio abdomen ordered.  GI made aware of recurrent GI bleed.  Repeated hemoglobin 6.1.  2 units PRBCs ordered to be transfused.  Denies abdominal pain, chest pain, or nausea.  No dizziness at the time of this exam.    Assessment/Plan: Principal Problem:   GI bleed Active Problems:   Duodenal ulcer with hemorrhage   Rectal bleeding  Acute blood loss anemia secondary to recurrent GI bleed, suspect upper GI bleed from duodenal ulcer post EGD on 11/14/2021. -Likely recurrent duodenum bulb ulcer bleed Presented with hemoglobin of 5.1 with symptomatic anemia. 3 units PRBCs transfused. Hemoglobin 7.8 on 11/04/2021. -CTA not showing any active bleeding lesions for IR intervention, on PPI drip.  Post EGD on 11/14/2021.  Per GI, EGD showed duodenal ulcer that had overlying clot but no active signs of bleeding. This may have bled but then stopped, or he may have had an alternative source of Hb drop. Rec proton pump  inhibitor PO BID for 8 weeks and sucralfate suspension 1 gram PO QID for 1 week.  Recurrent GI bleed on 11/05/2021.  Protonix drip restarted.  2 unit PRBC ordered to be transfused for hemoglobin of 6.1.   Follow stat CT abdomen 11/15/2021.  NPO. Rest of management per GI   History of duodenal ulcer seen on EGD 11/14/2021 Management as stated above  Resolved acute hypoxic respiratory failure secondary to mild pulm edema seen on chest x-ray. Personally reviewed chest x-ray done on 11/13/2021 showing increase in pulmonary vascularity suggestive of mild pulmonary edema. Not on supplementation at baseline. Post HD. Currently on room air with O2 saturation above 95%.  ESRD on HD TTS Last outpatient hemodialysis 11/12/2021 and 11/13/2021. Appreciate nephrology's assistance   Coagulopathy -INR elevated on last 2 admissions INR 1.5 on 11/13/2021. LFTs within normal limit.   Question of hemochromatosis -Outpatient follow-up with hematology   History of pericardial effusion -Small amount on recent echo, consider recheck echo if after medically stable still having hypoxia   HTN -Hold all BP meds due to active GI bleed. Maintain MAP greater than 65. Not on IV fluid hydration due to ESRD.  Critical care time: 65 minutes      DVT prophylaxis: SCD, pharmacological DVT prophylaxis is contraindicated in the setting of suspected GI bleed Code Status: Full code Family Communication: None at bedside. Disposition Plan: Likely will discharge to home if no recurrent GI bleeding. Consults called: GI, general surgery, nephrology. Admission status: Inpatient status.      Status is: Inpatient  Patient will require at least  2 midnights for further evaluation and treatment of present condition.      Objective: Vitals:   11/14/21 2325 11/15/21 0716 11/15/21 0807 11/15/21 0850  BP: (!) 133/94 96/73 95/69  116/67  Pulse:   (!) 101   Resp:  14 12 12   Temp:  98.1 F (36.7 C) 98.2 F (36.8 C)  97.6 F (36.4 C)  TempSrc:  Oral Oral Oral  SpO2:   97% 97%  Weight:      Height:        Intake/Output Summary (Last 24 hours) at 11/15/2021 2751 Last data filed at 11/15/2021 0700 Gross per 24 hour  Intake 171.03 ml  Output 2073 ml  Net -1901.97 ml   Filed Weights   11/13/21 2234 11/14/21 0204 11/14/21 1422  Weight: 74.4 kg 70.4 kg 73 kg    Exam:  General: 61 y.o. year-old male frail-appearing no acute distress.  Alert and oriented x3.   Cardiovascular: Regular rate and rhythm no rubs or gallops. Respiratory: Clear to auscultation no wheeze or rales Abdomen: Soft nontender normal bowel sounds present Musculoskeletal: No lower extremity edema bilaterally. Skin: No ulcerative lesions Psychiatry: Mood is appropriate for condition and setting Neuro: Nonfocal exam   Data Reviewed: CBC: Recent Labs  Lab 11/09/21 0434 11/12/21 1501 11/12/21 1526 11/13/21 0113 11/13/21 0645 11/13/21 2024 11/14/21 0745 11/14/21 1015 11/15/21 0233 11/15/21 0805  WBC 11.7* 21.2*  --  10.5  --   --   --  10.8* 11.5*  --   NEUTROABS 7.8*  --   --   --   --   --   --   --   --   --   HGB 7.9* 5.1*   < > 6.1*   < > 7.3* 9.2* 7.8* 8.2* 6.1*  HCT 23.4* 15.7*   < > 18.3*   < > 21.3* 27.0* 23.7* 24.3* 18.0*  MCV 84.2 88.2  --  85.5  --   --   --  86.5 85.3  --   PLT 229 284  --  232  --   --   --  262 284  --    < > = values in this interval not displayed.   Basic Metabolic Panel: Recent Labs  Lab 11/09/21 0434 11/12/21 1501 11/12/21 1526 11/13/21 0113 11/14/21 0745 11/14/21 1015 11/15/21 0233  NA 128* 138 136 132* 133* 131* 130*  K 4.6 4.1 4.0 4.2 4.1 3.5 4.0  CL 90* 98 96* 92* 94* 94* 95*  CO2 21* 27  --  25  --  27 22  GLUCOSE 93 130* 122* 83 67* 80 101*  BUN 89* 41* 40* 53* 34* 35* 21*  CREATININE 6.52* 4.68* 4.70* 5.02* 3.90* 3.91* 3.03*  CALCIUM 8.1* 7.7*  --  7.7*  --  7.7* 7.9*  PHOS 6.5*  --   --   --   --  3.6 3.3   GFR: Estimated Creatinine Clearance: 26.8 mL/min (A)  (by C-G formula based on SCr of 3.03 mg/dL (H)). Liver Function Tests: Recent Labs  Lab 11/09/21 0434 11/12/21 1501 11/14/21 1015 11/15/21 0233  AST  --  22  --   --   ALT  --  27  --   --   ALKPHOS  --  55  --   --   BILITOT  --  0.5  --   --   PROT  --  5.6*  --   --   ALBUMIN 2.4* 2.0* 2.1* 2.1*  No results for input(s): LIPASE, AMYLASE in the last 168 hours. No results for input(s): AMMONIA in the last 168 hours. Coagulation Profile: Recent Labs  Lab 11/13/21 0113  INR 1.5*   Cardiac Enzymes: No results for input(s): CKTOTAL, CKMB, CKMBINDEX, TROPONINI in the last 168 hours. BNP (last 3 results) No results for input(s): PROBNP in the last 8760 hours. HbA1C: No results for input(s): HGBA1C in the last 72 hours. CBG: Recent Labs  Lab 11/14/21 0850 11/14/21 0916 11/14/21 1014 11/14/21 1149 11/15/21 0745  GLUCAP 63* 84 79 142* 71   Lipid Profile: No results for input(s): CHOL, HDL, LDLCALC, TRIG, CHOLHDL, LDLDIRECT in the last 72 hours. Thyroid Function Tests: No results for input(s): TSH, T4TOTAL, FREET4, T3FREE, THYROIDAB in the last 72 hours. Anemia Panel: No results for input(s): VITAMINB12, FOLATE, FERRITIN, TIBC, IRON, RETICCTPCT in the last 72 hours. Urine analysis:    Component Value Date/Time   COLORURINE YELLOW 05/19/2021 2015   APPEARANCEUR HAZY (A) 05/19/2021 2015   LABSPEC 1.012 05/19/2021 2015   PHURINE 9.0 (H) 05/19/2021 2015   GLUCOSEU NEGATIVE 05/19/2021 2015   HGBUR NEGATIVE 05/19/2021 2015   Vantage 05/19/2021 2015   KETONESUR NEGATIVE 05/19/2021 2015   PROTEINUR >=300 (A) 05/19/2021 2015   NITRITE NEGATIVE 05/19/2021 2015   LEUKOCYTESUR SMALL (A) 05/19/2021 2015   Sepsis Labs: @LABRCNTIP (procalcitonin:4,lacticidven:4)  ) Recent Results (from the past 240 hour(s))  MRSA Next Gen by PCR, Nasal     Status: None   Collection Time: 11/05/21  9:38 AM   Specimen: Nasal Mucosa; Nasal Swab  Result Value Ref Range Status    MRSA by PCR Next Gen NOT DETECTED NOT DETECTED Final    Comment: (NOTE) The GeneXpert MRSA Assay (FDA approved for NASAL specimens only), is one component of a comprehensive MRSA colonization surveillance program. It is not intended to diagnose MRSA infection nor to guide or monitor treatment for MRSA infections. Test performance is not FDA approved in patients less than 21 years old. Performed at [facility], [place] [address] [place], [place] [address]   Resp Panel by RT-PCR (Flu A&B, Covid) Nasopharyngeal Swab     Status: None   Collection Time: 11/12/21  4:22 PM   Specimen: Nasopharyngeal Swab; Nasopharyngeal(NP) swabs in vial transport medium  Result Value Ref Range Status   SARS Coronavirus 2 by RT PCR NEGATIVE NEGATIVE Final    Comment: (NOTE) SARS-CoV-2 target nucleic acids are NOT DETECTED.  The SARS-CoV-2 RNA is generally detectable in upper respiratory specimens during the acute phase of infection. The lowest concentration of SARS-CoV-2 viral copies this assay can detect is 138 copies/mL. A negative result does not preclude SARS-Cov-2 infection and should not be used as the sole basis for treatment or other patient management decisions. A negative result may occur with  improper specimen collection/handling, submission of specimen other than nasopharyngeal swab, presence of viral mutation(s) within the areas targeted by this assay, and inadequate number of viral copies(<138 copies/mL). A negative result must be combined with clinical observations, patient history, and epidemiological information. The expected result is Negative.  Fact Sheet for Patients:  EntrepreneurPulse.com.au  Fact Sheet for Healthcare Providers:  IncredibleEmployment.be  This test is no t yet approved or cleared by the [place] FDA and  has been authorized for detection and/or diagnosis of SARS-CoV-2 by FDA under an Emergency Use Authorization (EUA).  This EUA will remain  in effect (meaning this test can be used) for the duration of the COVID-19 declaration  under Section 564(b)(1) of the Act, 21 U.S.C.section 360bbb-3(b)(1), unless the authorization is terminated  or revoked sooner.       Influenza A by PCR NEGATIVE NEGATIVE Final   Influenza B by PCR NEGATIVE NEGATIVE Final    Comment: (NOTE) The Xpert Xpress SARS-CoV-2/FLU/RSV plus assay is intended as an aid in the diagnosis of influenza from Nasopharyngeal swab specimens and should not be used as a sole basis for treatment. Nasal washings and aspirates are unacceptable for Xpert Xpress SARS-CoV-2/FLU/RSV testing.  Fact Sheet for Patients: EntrepreneurPulse.com.au  Fact Sheet for Healthcare Providers: IncredibleEmployment.be  This test is not yet approved or cleared by the [place] FDA and has been authorized for detection and/or diagnosis of SARS-CoV-2 by FDA under an Emergency Use Authorization (EUA). This EUA will remain in effect (meaning this test can be used) for the duration of the COVID-19 declaration under Section 564(b)(1) of the Act, 21 U.S.C. section 360bbb-3(b)(1), unless the authorization is terminated or revoked.  Performed at [facility], [place] [address] [place], [place] [address]       Studies: No results found.  Scheduled Meds:  sodium chloride   Intravenous Once   Chlorhexidine Gluconate Cloth  6 each Topical Q0600   doxercalciferol  2 mcg Intravenous Q T,Th,Sa-HD   [START ON 11/18/2021] pantoprazole  40 mg Intravenous Q12H   sevelamer carbonate  1,600 mg Oral TID with meals    Continuous Infusions:  sodium chloride     sodium chloride     pantoprazole     pantoprazole 8 mg/hr (11/14/21 1828)   pantoprazole     sodium chloride       LOS: 3 days     Kayleen Memos, MD [facility] Pager [phone]  If 7PM-7AM, please contact night-coverage [email or web address] Password  [facility] 11/15/2021, 8:52 AM

## 2021-11-15 NOTE — Significant Event (Signed)
Rapid Response Event Note   Reason for Call :  Hypotension 86/58 (69) Bloody bowel movements Hgb 6.1  Initial Focused Assessment:  Pt lying in bed, AO. Skin is warm, dry, pink. Expiratory wheeze present. Abdomen is soft, bowel sounds are active. Jaundice sclera bilaterally. Pt denies complaints other than being hungry.  VS: T 97.61F, BP 116/67, HR 100, RR 10, SpO2 96% on room air CBG: 71 this morning  Interventions:  -No intervention from RRT  Plan of Care:  -Maintain PIV x2 -Increase frequency of VS checks -Hgb 6.1, plan to transfuse PRBCs -H&H 2 hours post transfusion completion   Call rapid response for additional needs Event Summary:  MD Notified: Dr. Nevada Crane Call Time: [phone] Arrival Time: 2751 End Time: 7001  Casimer Bilis, RN

## 2021-11-15 NOTE — Progress Notes (Signed)
Assisted tele visit to patient with wife. Connection successful.  Pt had become very agitated and frustrated.  Voicing that he was done with everything and refusing care.  Was able to deescalate pt, but felt that conversation with wife would help, and pt agreed to be more cooperative.  Darlys Gales, RN

## 2021-11-15 NOTE — Progress Notes (Signed)
[facility] Progress Note Patient Name: Timothy [name] DOB: 04/16/1961 MRN: [id number]   Date of Service  11/15/2021  HPI/Events of Note  Anemia - Hgb = 6.2.  eICU Interventions  Will transfuse 1 unit PRBC.     Intervention Category Major Interventions: Other:  Lysle Dingwall 11/15/2021, 8:37 PM

## 2021-11-15 NOTE — H&P (View-Only) (Signed)
° °       Daily Rounding Note ° °11/15/2021, 8:17 AM ° LOS: 3 days  ° °SUBJECTIVE:   °Chief complaint:  painless hematochezia.   °Pt with ESRD. °Presented 10 days ago with hematemesis, hematochezia.  Was not taking previous RXd Protonix 40 bid.  EGD by Dr. Hung with large, bleeding duodenal ulcer treated with chemo spray.  Then underwent 11/05/2021 GDA embolization.  Treated with PRBC.  Seen 1/28 by GI for recurrent hematemesis, hematochezia, blood loss anemia/Hgb 5.1, a/w syncope (up to 9.2 post 3PRBCs).  Also received 1 FFP on 1/29.  By the time GI involved, bleeding had stopped, exam benign.  CT angiogram without obvious bleeding or source for bleeding.  Differential included postembolization ischemia, coil migration, pseudoaneurysm, revascularization at embolize site though none of these clearly identified on CTA.  Surgery got involved, recommended exhausting nonsurgical interventions given high risk due to heart failure, ESRD, pericardial effusion. °11/14/2028 EGD revealed small HH.  Antral erythema, nonbleeding duodenal ulcer with adherent clot but no active bleeding.  He was to continue PPI bid and Carafate suspension qid.   °Late yesterday evening and early this morning through this afternoon having recurrent painless hematochezia.  BPs hypertensive last night in the 150s to 160s/90s to 100, no tachycardia.  This morning heart rate as high as 113 pressures down to mid 90s/70, MAP 73.  Hgb 7.8 yest AM, 8.2 at 0230 this AM, 6.1 at 0800. 3rd of 3 PRBCs transfusing now (5th unit for this admission) °Last episode of bowel movement was a burgundy stool at about 3 PM.  Patient denies abdominal pain and nausea.  Feels exhausted.  Has been transferred to 3[facility] ICU.  No issues with his breathing. ° °OBJECTIVE:        ° Vital signs in last 24 hours:    °Temp:  [97.1 °F (36.2 °C)-98.9 °F (37.2 °C)] 98.2 °F (36.8 °C) (01/31 0807) °Pulse Rate:  [79-101] 101 (01/31  0807) °Resp:  [12-25] 12 (01/31 0807) °BP: (95-167)/(69-118) 95/69 (01/31 0807) °SpO2:  [91 %-100 %] 97 % (01/31 0807) °Weight:  [73 kg] 73 kg (01/30 1422) °Last BM Date: 11/13/21 °Filed Weights  ° 11/13/21 2234 11/14/21 0204 11/14/21 1422  °Weight: 74.4 kg 70.4 kg 73 kg  ° °General: Looks exhausted.  Comfortable.  Chronically ill-appearing °Heart: RRR. °Chest: Clear bilaterally without labored breathing.   °Abdomen: Soft without tenderness.  No distention.  Active bowel sounds. °Extremities: No CCE. °Neuro/Psych: Flat affect.  Laconic.  Not confused.  Moves all 4 limbs.  No tremor. ° °Intake/Output from previous day: °01/30 0701 - 01/31 0700 °In: 171 [I.V.:171] °Out: 2073 [Stool:3] ° °Intake/Output this shift: °No intake/output data recorded. ° °Lab Results: °Recent Labs  °  11/13/21 °0113 11/13/21 °0645 11/14/21 °0745 11/14/21 °1015 11/15/21 °0233  °WBC 10.5  --   --  10.8* 11.5*  °HGB 6.1*   < > 9.2* 7.8* 8.2*  °HCT 18.3*   < > 27.0* 23.7* 24.3*  °PLT 232  --   --  262 284  ° < > = values in this interval not displayed.  ° °BMET °Recent Labs  °  11/13/21 °0113 11/14/21 °0745 11/14/21 °1015 11/15/21 °0233  °NA 132* 133* 131* 130*  °K 4.2 4.1 3.5 4.0  °CL 92* 94* 94* 95*  °CO2 25  --  27 22  °GLUCOSE 83 67* 80 101*  °BUN 53* 34* 35* 21*  °CREATININE 5.02* 3.90* 3.91* 3.03*  °CALCIUM 7.7*  --  7.7* 7.9*  ° °  0233  NA 132* 133* 131* 130*  K 4.2 4.1 3.5 4.0  CL 92* 94* 94* 95*  CO2 25  --  27 22  GLUCOSE 83 67* 80 101*  BUN 53* 34* 35* 21*  CREATININE 5.02* 3.90* 3.91* 3.03*  CALCIUM 7.7*  --  7.7* 7.9*   LFT Recent Labs    11/12/21 1501 11/14/21 1015 11/15/21 0233  PROT 5.6*  --   --   ALBUMIN 2.0* 2.1* 2.1*  AST 22  --   --   ALT 27  --   --   ALKPHOS 55  --   --   BILITOT 0.5  --   --    PT/INR Recent Labs    11/13/21 0113  LABPROT 18.0*  INR 1.5*   Hepatitis Panel Recent Labs    11/13/21 2158  HEPBSAG NON REACTIVE    Studies/Results: DG CHEST PORT 1 VIEW  Result Date: 11/13/2021 CLINICAL DATA:  Hypoxia. EXAM: PORTABLE CHEST 1 VIEW COMPARISON:  11/08/2021 and older exams. FINDINGS: Cardiac silhouette is mildly enlarged. No mediastinal or hilar masses. There are linear opacities in  both lung bases and in the mid right lung. Hazy opacities noted in the left lower lung, behind the cardiac silhouette. Remainder of the lungs is clear. No pneumothorax. Skeletal structures are grossly intact. IMPRESSION: 1. Findings without convincing change from the most recent prior study allowing for differences in patient positioning and technique. 2. Linear opacities in the lung bases and right mid lung are consistent with atelectasis. Additional hazy opacity in the left lung base is also likely due to atelectasis, possibly with layering pleural fluid. Infection at the left lung base should be considered in the proper clinical setting. 3. No evidence of pulmonary edema. Electronically Signed   By: Lajean Manes M.D.   On: 11/13/2021 10:13    Scheduled Meds:  Chlorhexidine Gluconate Cloth  6 each Topical Q0600   doxercalciferol  2 mcg Intravenous Q T,Th,Sa-HD   metoCLOPramide (REGLAN) injection  10 mg Intravenous Q6H   [START ON 11/18/2021] pantoprazole  40 mg Intravenous Q12H   peg 3350 powder  0.5 kit Oral Once   And   peg 3350 powder  0.5 kit Oral Once   sevelamer carbonate  1,600 mg Oral TID with meals   Continuous Infusions:  sodium chloride     sodium chloride     pantoprazole 300 mL/hr at 11/15/21 1257   pantoprazole 8 mg/hr (11/15/21 1425)   sodium chloride     PRN Meds:.ipratropium-albuterol, oxyCODONE   ASSESMENT:   Bleeding duodenal ulcer.  EGD with hemospray to bleeding duodenal ulcer status post 1/21 GDA embolization.  EGD yesterday with antral erythema nonbleeding duodenal ulcer with adherent clot, no active bleeding.  Recurrent aggressive bleeding with hemodynamic instability beginning early this morning.  Protonix drip reinitiated.  Blood loss anemia.  5 PRBCs thus far, fifth unit transfusing now his third for today.  ESRD.  On HD.   PLAN   Dr. Lorenso Courier planning EGD and colonoscopy tomorrow.  Set for 9 AM.  Movie prep orders in place.  Clear liquids.  Azucena Freed   11/15/2021, 8:17 AM Phone [phone]   I have taken a history, reviewed the chart and examined the patient. I performed a substantive portion of this encounter, including complete performance of at least one of the key components, in conjunction with the APP. I agree with the APP's note, impression and recommendations.   Patient had recurrent hematochezia/melena this morning.  or alternatively may be bleeding from a Dieulafoy's lesion. Will plan for EGD/colonoscopy tomorrow for further evaluation. Continue PPI gtt. °

## 2021-11-15 NOTE — Anesthesia Postprocedure Evaluation (Signed)
Anesthesia Post Note  Patient: Timothy [name]  Procedure(s) Performed: ESOPHAGOGASTRODUODENOSCOPY (EGD) WITH PROPOFOL     Patient location during evaluation: Endoscopy Anesthesia Type: MAC Level of consciousness: awake and alert Pain management: pain level controlled Vital Signs Assessment: post-procedure vital signs reviewed and stable Respiratory status: spontaneous breathing, nonlabored ventilation, respiratory function stable and patient connected to nasal cannula oxygen Cardiovascular status: stable and blood pressure returned to baseline Postop Assessment: no apparent nausea or vomiting Anesthetic complications: no   No notable events documented.  Last Vitals:  Vitals:   11/15/21 0807 11/15/21 0850  BP: 95/69 116/67  Pulse: (!) 101   Resp: 12 12  Temp: 36.8 C 36.4 C  SpO2: 97% 97%    Last Pain:  Vitals:   11/15/21 0850  TempSrc: Oral  PainSc:                  March Rummage Sunni Richardson

## 2021-11-15 NOTE — Progress Notes (Addendum)
Daily Rounding Note  11/15/2021, 8:17 AM  LOS: 3 days   SUBJECTIVE:   Chief complaint:  painless hematochezia.   Pt with ESRD. Presented 10 days ago with hematemesis, hematochezia.  Was not taking previous RXd Protonix 40 bid.  EGD by Dr. Benson Norway with large, bleeding duodenal ulcer treated with chemo spray.  Then underwent 11/05/2021 GDA embolization.  Treated with PRBC.  Seen 1/28 by GI for recurrent hematemesis, hematochezia, blood loss anemia/Hgb 5.1, a/w syncope (up to 9.2 post 3PRBCs).  Also received 1 FFP on 1/29.  By the time GI involved, bleeding had stopped, exam benign.  CT angiogram without obvious bleeding or source for bleeding.  Differential included postembolization ischemia, coil migration, pseudoaneurysm, revascularization at embolize site though none of these clearly identified on CTA.  Surgery got involved, recommended exhausting nonsurgical interventions given high risk due to heart failure, ESRD, pericardial effusion. 11/14/2028 EGD revealed small HH.  Antral erythema, nonbleeding duodenal ulcer with adherent clot but no active bleeding.  He was to continue PPI bid and Carafate suspension qid.   Late yesterday evening and early this morning through this afternoon having recurrent painless hematochezia.  BPs hypertensive last night in the 150s to 160s/90s to 100, no tachycardia.  This morning heart rate as high as 113 pressures down to mid 90s/70, MAP 73.  Hgb 7.8 yest AM, 8.2 at 0230 this AM, 6.1 at 0800. 3rd of 3 PRBCs transfusing now (5th unit for this admission) Last episode of bowel movement was a burgundy stool at about 3 PM.  Patient denies abdominal pain and nausea.  Feels exhausted.  Has been transferred to [facility] ICU.  No issues with his breathing.  OBJECTIVE:         Vital signs in last 24 hours:    Temp:  [97.1 F (36.2 C)-98.9 F (37.2 C)] 98.2 F (36.8 C) (01/31 0807) Pulse Rate:  [79-101] 101 (01/31  0807) Resp:  [12-25] 12 (01/31 0807) BP: (95-167)/(69-118) 95/69 (01/31 0807) SpO2:  [91 %-100 %] 97 % (01/31 0807) Weight:  [73 kg] 73 kg (01/30 1422) Last BM Date: 11/13/21 Filed Weights   11/13/21 2234 11/14/21 0204 11/14/21 1422  Weight: 74.4 kg 70.4 kg 73 kg   General: Looks exhausted.  Comfortable.  Chronically ill-appearing Heart: RRR. Chest: Clear bilaterally without labored breathing.   Abdomen: Soft without tenderness.  No distention.  Active bowel sounds. Extremities: No CCE. Neuro/Psych: Flat affect.  Laconic.  Not confused.  Moves all 4 limbs.  No tremor.  Intake/Output from previous day: 01/30 0701 - 01/31 0700 In: 171 [I.V.:171] Out: 2073 [Stool:3]  Intake/Output this shift: No intake/output data recorded.  Lab Results: Recent Labs    11/13/21 0113 11/13/21 0645 11/14/21 0745 11/14/21 1015 11/15/21 0233  WBC 10.5  --   --  10.8* 11.5*  HGB 6.1*   < > 9.2* 7.8* 8.2*  HCT 18.3*   < > 27.0* 23.7* 24.3*  PLT 232  --   --  262 284   < > = values in this interval not displayed.   BMET Recent Labs    11/13/21 0113 11/14/21 0745 11/14/21 1015 11/15/21 0233  NA 132* 133* 131* 130*  K 4.2 4.1 3.5 4.0  CL 92* 94* 94* 95*  CO2 25  --  27 22  GLUCOSE 83 67* 80 101*  BUN 53* 34* 35* 21*  CREATININE 5.02* 3.90* 3.91* 3.03*  CALCIUM 7.7*  --  7.7* 7.9*  0233  NA 132* 133* 131* 130*  K 4.2 4.1 3.5 4.0  CL 92* 94* 94* 95*  CO2 25  --  27 22  GLUCOSE 83 67* 80 101*  BUN 53* 34* 35* 21*  CREATININE 5.02* 3.90* 3.91* 3.03*  CALCIUM 7.7*  --  7.7* 7.9*   LFT Recent Labs    11/12/21 1501 11/14/21 1015 11/15/21 0233  PROT 5.6*  --   --   ALBUMIN 2.0* 2.1* 2.1*  AST 22  --   --   ALT 27  --   --   ALKPHOS 55  --   --   BILITOT 0.5  --   --    PT/INR Recent Labs    11/13/21 0113  LABPROT 18.0*  INR 1.5*   Hepatitis Panel Recent Labs    11/13/21 2158  HEPBSAG NON REACTIVE    Studies/Results: DG CHEST PORT 1 VIEW  Result Date: 11/13/2021 CLINICAL DATA:  Hypoxia. EXAM: PORTABLE CHEST 1 VIEW COMPARISON:  11/08/2021 and older exams. FINDINGS: Cardiac silhouette is mildly enlarged. No mediastinal or hilar masses. There are linear opacities in  both lung bases and in the mid right lung. Hazy opacities noted in the left lower lung, behind the cardiac silhouette. Remainder of the lungs is clear. No pneumothorax. Skeletal structures are grossly intact. IMPRESSION: 1. Findings without convincing change from the most recent prior study allowing for differences in patient positioning and technique. 2. Linear opacities in the lung bases and right mid lung are consistent with atelectasis. Additional hazy opacity in the left lung base is also likely due to atelectasis, possibly with layering pleural fluid. Infection at the left lung base should be considered in the proper clinical setting. 3. No evidence of pulmonary edema. Electronically Signed   By: Lajean Manes M.D.   On: 11/13/2021 10:13    Scheduled Meds:  Chlorhexidine Gluconate Cloth  6 each Topical Q0600   doxercalciferol  2 mcg Intravenous Q T,Th,Sa-HD   metoCLOPramide (REGLAN) injection  10 mg Intravenous Q6H   [START ON 11/18/2021] pantoprazole  40 mg Intravenous Q12H   peg 3350 powder  0.5 kit Oral Once   And   peg 3350 powder  0.5 kit Oral Once   sevelamer carbonate  1,600 mg Oral TID with meals   Continuous Infusions:  sodium chloride     sodium chloride     pantoprazole 300 mL/hr at 11/15/21 1257   pantoprazole 8 mg/hr (11/15/21 1425)   sodium chloride     PRN Meds:.ipratropium-albuterol, oxyCODONE   ASSESMENT:   Bleeding duodenal ulcer.  EGD with hemospray to bleeding duodenal ulcer status post 1/21 GDA embolization.  EGD yesterday with antral erythema nonbleeding duodenal ulcer with adherent clot, no active bleeding.  Recurrent aggressive bleeding with hemodynamic instability beginning early this morning.  Protonix drip reinitiated.  Blood loss anemia.  5 PRBCs thus far, fifth unit transfusing now his third for today.  ESRD.  On HD.   PLAN   Dr. Lorenso Courier planning EGD and colonoscopy tomorrow.  Set for 9 AM.  Movie prep orders in place.  Clear liquids.  Azucena Freed   11/15/2021, 8:17 AM Phone [phone]   I have taken a history, reviewed the chart and examined the patient. I performed a substantive portion of this encounter, including complete performance of at least one of the key components, in conjunction with the APP. I agree with the APP's note, impression and recommendations.   Patient had recurrent hematochezia/melena this morning.  or alternatively may be bleeding from a Dieulafoy's lesion. Will plan for EGD/colonoscopy tomorrow for further evaluation. Continue PPI gtt.

## 2021-11-15 NOTE — Progress Notes (Signed)
This nurse came on shift to pt yelling help for pt bathroom. Pt had a BM with a lot of Blood in toilet. Dr. [place] Crane paged. Vitals were taken refer to Flowsheets. Verbal order from Dr. [place] Crane to keep pt NPO no food or drink.

## 2021-11-15 NOTE — Care Management Important Message (Signed)
Important Message  Patient Details  Name: Timothy [name] MRN: [id number] Date of Birth: [birth year]   Medicare Important Message Given:  Yes     Hannah Beat 11/15/2021, 11:14 AM

## 2021-11-15 NOTE — Progress Notes (Signed)
Nurse came to this nurse reported pt was having bright red blood coming from rectum. In to room to assess pt. Noted 2 medium size clots on bed. MD has been made aware. Rapid response called. See new orders. HGB critical 6.1. MD made aware NO for PRBC. PRBC started per orders. BP 89/78, MD made aware and ICU transfer put in.

## 2021-11-15 NOTE — Progress Notes (Signed)
Pt actively bleeding out of rectum this nurse found 2 Large blood clots in pts bed. Pt  also told this nurse that he wanted to be a DNR.

## 2021-11-15 NOTE — Consult Note (Signed)
NAME:  Timothy [name], MRN:  [id number], DOB:  08/06/1961, LOS: 3 ADMISSION DATE:  11/12/2021 CONSULTATION DATE:  11/15/2021 REFERRING MD:  [place] Crane - TRH CHIEF COMPLAINT:  GIB, hypotension   History of Present Illness:  61 year old man who presented to [facility] ED 1/28 via EMS after being found down on the bathroom floor with rectal bleeding.  EMS was called and patient was transferred to [facility] ED. Found to have GI bleed with associated symptomatic anemia (Hgb 5.1).  Transfused 3 units PRBCs.  EGD 1/30 demonstrated duodenal ulcer with overlying adherent clot but no signs of active bleeding.  On 1/31, patient had large volume melena with clots and associated Hgb drop to 6.6.  He became relatively hypotensive with SBPs 80s and mild tachycardia to low 100s.  Received additional 2 units PRBCs.  PCCM was consulted for possible ICU admission/pressor initiation.  Pertinent Medical History:   Past Medical History:  Diagnosis Date   Anemia of chronic kidney failure    BPH (benign prostatic hyperplasia)    Colon cancer ([place]) 2014   End stage renal disease on dialysis ([place]) 2017   Hypertension    Hypertensive heart disease with chronic diastolic congestive heart failure ([place]) 07/17/2016   Polysubstance abuse ([place])    History of heroin and marijuana use   Significant Hospital Events: Including procedures, antibiotic start and stop dates in addition to other pertinent events   1/28 - Presented to [facility] via EMS for rectal bleeding, found down at home. Admitted to [facility]. 1/30 - EGD demonstrated duodenal ulcer with overlying adherent clot but no active bleeding. GI following. 1/31 - Large volume melena with clots, mild tachycardia and hypotension with SBPs 80s. Hgb 6.6, 2U PRBCs ordered. PCCM consulted for possible ICU admission.  Interim History / Subjective:  Feeling ok overall Reports feeling thirsty and some mild back pain Reports passing large bloody stools early this morning No abdominal  pain, n/v or hematemesis  Objective:  Blood pressure 111/66, pulse (!) 105, temperature 98.4 F (36.9 C), temperature source Oral, resp. rate (!) 9, height 6\' 1"  (1.854 m), weight 73 kg, SpO2 97 %.        Intake/Output Summary (Last 24 hours) at 11/15/2021 1038 Last data filed at 11/15/2021 0700 Gross per 24 hour  Intake 171.03 ml  Output 2073 ml  Net -1901.97 ml   Filed Weights   11/13/21 2234 11/14/21 0204 11/14/21 1422  Weight: 74.4 kg 70.4 kg 73 kg   Physical Examination: General: Acutely ill-appearing middle-aged man in NAD. Resting comfortably in bed. HEENT: [place]/AT, anicteric sclera, PERRL, dry mucous membranes. Neuro: Awake, oriented x 4. Responds to verbal stimuli. Following commands consistently. Moves all 4 extremities spontaneously.  CV: Mild tachycardia, II/VI systolic murmur heard best over LLSB. PULM: Breathing even and unlabored on RA. Lung fields CTAB. GI: Soft, nontender, nondistended. Normoactive bowel sounds. Extremities: No LE edema noted. Skin: Warm/dry, no rashes.  Resolved Hospital Problem List:    Assessment & Plan:  Mr. Nin is seen in consultation at the request of Dr. [place] Crane [facility]) for further evaluation and management of persistent UGIB with associated anemia and hypotension.  Upper GIB Melena Presented to [facility] ED via EMS 1/28 for rectal bleeding. Symptomatic anemia noted with Hgb 5.1 requiring 3U PRBCs. EGD completed with GI 1/30 demonstrating duodenal ulcer with adherent clot, but no active bleeding. - GI consulted, following; appreciate assistance with management - Ongoing concern for source such as Dieulafoy's lesion that will continue to bleed/rebleed - Holding  all anticoagulation at present - Repeat EGD and colonoscopy 2/1, NPO at midnight - Continue CLD for now  ABLA in the setting of GIB Noted in the setting of active GIB. S/p 3U PRBCs 1/28, 2U PRBCs 1/31. - Trend H&H - Transfuse for Hgb 7.0 or hemodynamically significant  bleeding  Hypotension, resolved Tachycardia - Goal MAP > 65 - Volume resuscitation with PRBCs, fluids as tolerated - Not requiring pressor support at present - Cardiac monitoring  ESRD on HD - HD per Nephro - Trend BMP - Replete electrolytes as indicated - Monitor I&Os - Avoid nephrotoxic agents as able - Ensure adequate renal perfusion  Given improved hemodynamic status post-transfusion and stabilization of vitals, patient does not require ICU admission at this juncture. No indication for pressors at this time as patient's SBPs remain > 120 (MAP 90s). Would recommend close monitoring and transfer to ICU only if patient's Hgb continues to downtrend and he continues to have active bleeding requiring ongoing transfusion. Plan for EGD/colonoscopy as above. PCCM will continue to follow.  Best Practice: (right click and "Reselect all SmartList Selections" daily)   Diet/type: clear liquids DVT prophylaxis: SCD GI prophylaxis: PPI Lines: N/A Foley:  N/A Code Status:  full code Last date of multidisciplinary goals of care discussion [Per Primary Team]  Labs:  CBC: Recent Labs  Lab 11/09/21 0434 11/12/21 1501 11/12/21 1526 11/13/21 0113 11/13/21 0645 11/13/21 2024 11/14/21 0745 11/14/21 1015 11/15/21 0233 11/15/21 0805  WBC 11.7* 21.2*  --  10.5  --   --   --  10.8* 11.5*  --   NEUTROABS 7.8*  --   --   --   --   --   --   --   --   --   HGB 7.9* 5.1*   < > 6.1*   < > 7.3* 9.2* 7.8* 8.2* 6.1*  HCT 23.4* 15.7*   < > 18.3*   < > 21.3* 27.0* 23.7* 24.3* 18.0*  MCV 84.2 88.2  --  85.5  --   --   --  86.5 85.3  --   PLT 229 284  --  232  --   --   --  262 284  --    < > = values in this interval not displayed.   Basic Metabolic Panel: Recent Labs  Lab 11/09/21 0434 11/12/21 1501 11/12/21 1526 11/13/21 0113 11/14/21 0745 11/14/21 1015 11/15/21 0233  NA 128* 138 136 132* 133* 131* 130*  K 4.6 4.1 4.0 4.2 4.1 3.5 4.0  CL 90* 98 96* 92* 94* 94* 95*  CO2 21* 27  --  25   --  27 22  GLUCOSE 93 130* 122* 83 67* 80 101*  BUN 89* 41* 40* 53* 34* 35* 21*  CREATININE 6.52* 4.68* 4.70* 5.02* 3.90* 3.91* 3.03*  CALCIUM 8.1* 7.7*  --  7.7*  --  7.7* 7.9*  PHOS 6.5*  --   --   --   --  3.6 3.3   GFR: Estimated Creatinine Clearance: 26.8 mL/min (A) (by C-G formula based on SCr of 3.03 mg/dL (H)). Recent Labs  Lab 11/12/21 1501 11/13/21 0113 11/14/21 1015 11/15/21 0233  WBC 21.2* 10.5 10.8* 11.5*   Liver Function Tests: Recent Labs  Lab 11/09/21 0434 11/12/21 1501 11/14/21 1015 11/15/21 0233  AST  --  22  --   --   ALT  --  27  --   --   ALKPHOS  --  55  --   --  BILITOT  --  0.5  --   --   PROT  --  5.6*  --   --   ALBUMIN 2.4* 2.0* 2.1* 2.1*   No results for input(s): LIPASE, AMYLASE in the last 168 hours. No results for input(s): AMMONIA in the last 168 hours.  ABG:    Component Value Date/Time   PHART 7.394 11/05/2021 1043   PCO2ART 44.4 11/05/2021 1043   PO2ART 82 (L) 11/05/2021 1043   HCO3 27.1 11/05/2021 1043   TCO2 29 11/14/2021 0745   ACIDBASEDEF 4.0 (H) 09/09/2016 0931   O2SAT 96.0 11/05/2021 1043    Coagulation Profile: Recent Labs  Lab 11/13/21 0113  INR 1.5*   Cardiac Enzymes: No results for input(s): CKTOTAL, CKMB, CKMBINDEX, TROPONINI in the last 168 hours.  HbA1C: No results found for: HGBA1C  CBG: Recent Labs  Lab 11/14/21 0850 11/14/21 0916 11/14/21 1014 11/14/21 1149 11/15/21 0745  GLUCAP 63* 84 79 142* 71   Review of Systems:   Review of systems completed with pertinent positives/negatives outlined in above HPI.  Past Medical History:  He,  has a past medical history of Anemia of chronic kidney failure, BPH (benign prostatic hyperplasia), Colon cancer ([place]) (2014), End stage renal disease on dialysis ([place]) (2017), Hypertension, Hypertensive heart disease with chronic diastolic congestive heart failure ([place]) (07/17/2016), and Polysubstance abuse ([place]).   Surgical History:   Past Surgical History:   Procedure Laterality Date   ABDOMINAL SURGERY     AV FISTULA PLACEMENT Left 09/19/2016   Procedure: Left arm Radiocephalic ARTERIOVENOUS (AV) FISTULA CREATION;  Surgeon: Conrad Inverness, MD;  Location: [place];  Service: Vascular;  Laterality: Left;   [place] Left 07/09/2017   Procedure: BRACHIOCEPHALIC FISTULA CREATION;  Surgeon: Conrad [place], MD;  Location: [place];  Service: Vascular;  Laterality: Left;   COLON SURGERY  2014   ESOPHAGOGASTRODUODENOSCOPY (EGD) WITH PROPOFOL N/A 11/05/2021   Procedure: ESOPHAGOGASTRODUODENOSCOPY (EGD) WITH PROPOFOL;  Surgeon: Carol Ada, MD;  Location: [place];  Service: Endoscopy;  Laterality: N/A;   ESOPHAGOGASTRODUODENOSCOPY (EGD) WITH PROPOFOL N/A 11/14/2021   Procedure: ESOPHAGOGASTRODUODENOSCOPY (EGD) WITH PROPOFOL;  Surgeon: [name], MD;  Location: [place];  Service: Gastroenterology;  Laterality: N/A;   HEMOSTASIS CONTROL  11/05/2021   Procedure: HEMOSTASIS CONTROL;  Surgeon: Carol Ada, MD;  Location: [place];  Service: Endoscopy;;   INSERTION OF DIALYSIS CATHETER N/A 09/19/2016   Procedure: INSERTION OF TUNNELED DIALYSIS CATHETER;  Surgeon: Conrad , MD;  Location: [place];  Service: Vascular;  Laterality: N/A;   IR ANGIOGRAM VISCERAL SELECTIVE  11/05/2021   IR EMBO ART  VEN HEMORR LYMPH EXTRAV  INC GUIDE ROADMAPPING  11/05/2021   IR GENERIC HISTORICAL  09/14/2016   IR US GUIDE VASC ACCESS RIGHT 09/14/2016 [name], DO [facility]   IR GENERIC HISTORICAL  09/14/2016   IR FLUORO GUIDE CV LINE RIGHT 09/14/2016 [name], DO [facility]   IR PARACENTESIS  06/22/2021   IR US GUIDE [place] RIGHT  11/05/2021   LAPAROTOMY N/A 05/23/2021   Procedure: EXPLORATORY LAPAROTOMY LYSIS ADHESIONS;  Surgeon: [name], MD;  Location: [place];  Service: General;  Laterality: N/A;   ORIF TIBIA PLATEAU Left 01/15/2018   Procedure: OPEN REDUCTION INTERNAL FIXATION (ORIF) TIBIAL PLATEAU;  Surgeon: Altamese [place],  MD;  Location: [place];  Service: Orthopedics;  Laterality: Left;   SCLEROTHERAPY  11/05/2021   Procedure: Clide Deutscher;  Surgeon: Carol Ada, MD;  Location: [place];  Service: Endoscopy;;  TRANSTHORACIC ECHOCARDIOGRAM  07/2016    EF 60-65%, No RWMA. Mod Concentric LVH - Gr 2 DD. Severe LA dilation. PAP ~35 mmHg (mild Pulm HTN)  --> no changes noted 1 month later    Social History:   reports that he has been smoking cigarettes. He has been smoking an average of .25 packs per day. He has never used smokeless tobacco. He reports current drug use. Frequency: 1.00 time per week. Drugs: Marijuana and Heroin. He reports that he does not drink alcohol.   Family History:  His family history includes CAD in his nephew; Diabetes Mellitus II in his mother; Heart attack (age of onset: 32) in his mother; Heart failure in his mother; Hypertension in his mother; Kidney failure in his sister; Other in his father and another family member.   Allergies: No Known Allergies   Home Medications: Prior to Admission medications   Medication Sig Start Date End Date Taking? Authorizing Provider  carvedilol (COREG) 6.25 MG tablet Take 1 tablet (6.25 mg total) by mouth 2 (two) times daily with a meal. 10/09/21  Yes Sharen Hones, MD  LIDOCAINE-PRILOCAINE EX Apply 1 application topically See admin instructions. Prior to dialysis Tuesday,Thursday and saturday   Yes [provider]  Oxycodone HCl 10 MG TABS Take 10 mg by mouth 2 (two) times daily as needed (pain). 10/26/21  Yes [provider]  pantoprazole (PROTONIX) 40 MG tablet Take 1 tablet (40 mg total) by mouth 2 (two) times daily. Patient not taking: Reported on 11/12/2021 11/09/21   Geradine Girt, DO    Critical care time: N/A   Rhae Lerner [facility] 11/15/21 10:38 AM  Please see [email or web address] for pager details.  From 7A-7P if no response, please call [phone] After hours, please call ELink  [phone]

## 2021-11-16 ENCOUNTER — Inpatient Hospital Stay (HOSPITAL_COMMUNITY): Payer: Medicare (Managed Care)

## 2021-11-16 ENCOUNTER — Inpatient Hospital Stay (HOSPITAL_COMMUNITY): Payer: Medicare (Managed Care) | Admitting: Certified Registered"

## 2021-11-16 ENCOUNTER — Encounter (HOSPITAL_COMMUNITY)
Admission: EM | Disposition: A | Discharge status: Home Health | Payer: Self-pay | Source: Home / Self Care | Attending: Internal Medicine

## 2021-11-16 ENCOUNTER — Encounter (HOSPITAL_COMMUNITY): Payer: Self-pay | Admitting: Internal Medicine

## 2021-11-16 DIAGNOSIS — K922 Gastrointestinal hemorrhage, unspecified: Secondary | ICD-10-CM

## 2021-11-16 DIAGNOSIS — K264 Chronic or unspecified duodenal ulcer with hemorrhage: Secondary | ICD-10-CM | POA: Diagnosis not present

## 2021-11-16 HISTORY — PX: HEMOSTASIS CLIP PLACEMENT: SHX6857

## 2021-11-16 HISTORY — PX: IR ANGIOGRAM VISCERAL SELECTIVE: IMG657

## 2021-11-16 HISTORY — PX: HEMOSTASIS CONTROL: SHX6838

## 2021-11-16 HISTORY — PX: IR US GUIDE VASC ACCESS RIGHT: IMG2390

## 2021-11-16 HISTORY — PX: IR EMBO ART  VEN HEMORR LYMPH EXTRAV  INC GUIDE ROADMAPPING: IMG5450

## 2021-11-16 HISTORY — PX: IR ANGIOGRAM SELECTIVE EACH ADDITIONAL VESSEL: IMG667

## 2021-11-16 HISTORY — PX: ESOPHAGOGASTRODUODENOSCOPY (EGD) WITH PROPOFOL: SHX5813

## 2021-11-16 HISTORY — PX: SCLEROTHERAPY: SHX6841

## 2021-11-16 HISTORY — PX: HOT HEMOSTASIS: SHX5433

## 2021-11-16 LAB — CBC WITH DIFFERENTIAL/PLATELET
Abs Immature Granulocytes: 0.19 10*3/uL — ABNORMAL HIGH (ref 0.00–0.07)
Basophils Absolute: 0 10*3/uL (ref 0.0–0.1)
Basophils Relative: 0 %
Eosinophils Absolute: 0 10*3/uL (ref 0.0–0.5)
Eosinophils Relative: 0 %
HCT: 11.4 % — ABNORMAL LOW (ref 39.0–52.0)
Hemoglobin: 4.1 g/dL — CL (ref 13.0–17.0)
Immature Granulocytes: 1 %
Lymphocytes Relative: 5 %
Lymphs Abs: 0.9 10*3/uL (ref 0.7–4.0)
MCH: 30.1 pg (ref 26.0–34.0)
MCHC: 36 g/dL (ref 30.0–36.0)
MCV: 83.8 fL (ref 80.0–100.0)
Monocytes Absolute: 1.1 10*3/uL — ABNORMAL HIGH (ref 0.1–1.0)
Monocytes Relative: 7 %
Neutro Abs: 14.2 10*3/uL — ABNORMAL HIGH (ref 1.7–7.7)
Neutrophils Relative %: 87 %
Platelets: 139 10*3/uL — ABNORMAL LOW (ref 150–400)
RBC: 1.36 MIL/uL — ABNORMAL LOW (ref 4.22–5.81)
RDW: 16.9 % — ABNORMAL HIGH (ref 11.5–15.5)
WBC: 16.3 10*3/uL — ABNORMAL HIGH (ref 4.0–10.5)
nRBC: 0.1 % (ref 0.0–0.2)

## 2021-11-16 LAB — RENAL FUNCTION PANEL
Albumin: 1.7 g/dL — ABNORMAL LOW (ref 3.5–5.0)
Anion gap: 13 (ref 5–15)
BUN: 47 mg/dL — ABNORMAL HIGH (ref 6–20)
CO2: 20 mmol/L — ABNORMAL LOW (ref 22–32)
Calcium: 6.8 mg/dL — ABNORMAL LOW (ref 8.9–10.3)
Chloride: 98 mmol/L (ref 98–111)
Creatinine, Ser: 3.92 mg/dL — ABNORMAL HIGH (ref 0.61–1.24)
GFR, Estimated: 17 mL/min — ABNORMAL LOW (ref >60)
Glucose, Bld: 96 mg/dL (ref 70–99)
Phosphorus: 5.8 mg/dL — ABNORMAL HIGH (ref 2.5–4.6)
Potassium: 5.2 mmol/L — ABNORMAL HIGH (ref 3.5–5.1)
Sodium: 131 mmol/L — ABNORMAL LOW (ref 135–145)

## 2021-11-16 LAB — PROTIME-INR
INR: 1.8 — ABNORMAL HIGH (ref 0.8–1.2)
Prothrombin Time: 21.1 seconds — ABNORMAL HIGH (ref 11.4–15.2)

## 2021-11-16 LAB — POCT I-STAT, CHEM 8
BUN: 67 mg/dL — ABNORMAL HIGH (ref 6–20)
Calcium, Ion: 1.06 mmol/L — ABNORMAL LOW (ref 1.15–1.40)
Chloride: 92 mmol/L — ABNORMAL LOW (ref 98–111)
Creatinine, Ser: 4.7 mg/dL — ABNORMAL HIGH (ref 0.61–1.24)
Glucose, Bld: 90 mg/dL (ref 70–99)
HCT: 26 % — ABNORMAL LOW (ref 39.0–52.0)
Hemoglobin: 8.8 g/dL — ABNORMAL LOW (ref 13.0–17.0)
Potassium: 6.6 mmol/L (ref 3.5–5.1)
Sodium: 126 mmol/L — ABNORMAL LOW (ref 135–145)
TCO2: 24 mmol/L (ref 22–32)

## 2021-11-16 LAB — HEMOGLOBIN AND HEMATOCRIT, BLOOD
HCT: 23.1 % — ABNORMAL LOW (ref 39.0–52.0)
HCT: 25.1 % — ABNORMAL LOW (ref 39.0–52.0)
Hemoglobin: 8.2 g/dL — ABNORMAL LOW (ref 13.0–17.0)
Hemoglobin: 8.8 g/dL — ABNORMAL LOW (ref 13.0–17.0)

## 2021-11-16 LAB — GLUCOSE, CAPILLARY
Glucose-Capillary: 86 mg/dL (ref 70–99)
Glucose-Capillary: 94 mg/dL (ref 70–99)

## 2021-11-16 LAB — PREPARE RBC (CROSSMATCH)

## 2021-11-16 SURGERY — ESOPHAGOGASTRODUODENOSCOPY (EGD) WITH PROPOFOL
Anesthesia: Monitor Anesthesia Care

## 2021-11-16 MED ORDER — FENTANYL CITRATE (PF) 100 MCG/2ML IJ SOLN
INTRAMUSCULAR | Status: AC | PRN
  Administered 2021-11-16 (×3): 25 ug via INTRAVENOUS

## 2021-11-16 MED ORDER — IOHEXOL 300 MG/ML  SOLN
100.0000 mL | Freq: Once | INTRAMUSCULAR | Status: AC | PRN
  Administered 2021-11-16: 55 mL via INTRA_ARTERIAL

## 2021-11-16 MED ORDER — PROPOFOL 500 MG/50ML IV EMUL
INTRAVENOUS | Status: DC | PRN
  Administered 2021-11-16: 150 ug/kg/min via INTRAVENOUS

## 2021-11-16 MED ORDER — MORPHINE SULFATE (PF) 2 MG/ML IV SOLN
2.0000 mg | INTRAVENOUS | Status: DC | PRN
  Administered 2021-11-16 – 2021-11-17 (×2): 2 mg via INTRAVENOUS
  Filled 2021-11-16 (×3): qty 1

## 2021-11-16 MED ORDER — ALBUMIN HUMAN 5 % IV SOLN
INTRAVENOUS | Status: DC | PRN
Start: 2021-11-16 — End: 2021-11-16

## 2021-11-16 MED ORDER — SODIUM CHLORIDE 0.9% IV SOLUTION: Freq: Once | INTRAVENOUS | Status: DC

## 2021-11-16 MED ORDER — SODIUM CHLORIDE (PF) 0.9 % IJ SOLN
PREFILLED_SYRINGE | INTRAMUSCULAR | Status: DC | PRN
  Administered 2021-11-16: 9 mL

## 2021-11-16 MED ORDER — SODIUM CHLORIDE 0.9 % IV SOLN: INTRAVENOUS | Status: DC | PRN

## 2021-11-16 MED ORDER — PROPOFOL 10 MG/ML IV BOLUS
INTRAVENOUS | Status: DC | PRN
  Administered 2021-11-16: 30 mg via INTRAVENOUS

## 2021-11-16 MED ORDER — MIDAZOLAM HCL 2 MG/2ML IJ SOLN
INTRAMUSCULAR | Status: AC
  Filled 2021-11-16: qty 4

## 2021-11-16 MED ORDER — VITAMIN K1 10 MG/ML IJ SOLN
5.0000 mg | Freq: Once | INTRAVENOUS | Status: AC
  Administered 2021-11-16: 5 mg via INTRAVENOUS
  Filled 2021-11-16: qty 0.5

## 2021-11-16 MED ORDER — PANTOPRAZOLE SODIUM 40 MG IV SOLR
40.0000 mg | Freq: Two times a day (BID) | INTRAVENOUS | Status: DC
  Administered 2021-11-16 – 2021-11-17 (×3): 40 mg via INTRAVENOUS
  Filled 2021-11-16 (×4): qty 40

## 2021-11-16 MED ORDER — FENTANYL CITRATE (PF) 100 MCG/2ML IJ SOLN
INTRAMUSCULAR | Status: AC
  Filled 2021-11-16: qty 4

## 2021-11-16 MED ORDER — LIDOCAINE HCL 1 % IJ SOLN
INTRAMUSCULAR | Status: AC
  Filled 2021-11-16: qty 20

## 2021-11-16 MED ORDER — MIDAZOLAM HCL 2 MG/2ML IJ SOLN
INTRAMUSCULAR | Status: AC | PRN
  Administered 2021-11-16 (×3): .5 mg via INTRAVENOUS

## 2021-11-16 MED ORDER — SODIUM ZIRCONIUM CYCLOSILICATE 10 G PO PACK
10.0000 g | PACK | Freq: Once | ORAL | Status: AC
  Administered 2021-11-16: 10 g via ORAL
  Filled 2021-11-16: qty 1

## 2021-11-16 MED ORDER — GELATIN ABSORBABLE 12-7 MM EX MISC
CUTANEOUS | Status: AC
  Filled 2021-11-16: qty 1

## 2021-11-16 MED ORDER — IOHEXOL 300 MG/ML  SOLN
100.0000 mL | Freq: Once | INTRAMUSCULAR | Status: AC | PRN
  Administered 2021-11-16: 46 mL via INTRA_ARTERIAL

## 2021-11-16 MED ORDER — PHENYLEPHRINE HCL-NACL 20-0.9 MG/250ML-% IV SOLN
INTRAVENOUS | Status: DC | PRN
  Administered 2021-11-16: 50 ug/min via INTRAVENOUS

## 2021-11-16 SURGICAL SUPPLY — 25 items

## 2021-11-16 NOTE — Progress Notes (Signed)
Interval Progress Note:  Patient returned from IR, s/p embolization of the anterior and posterior pancreatico-duodenal arcades. No hemorrhage discovered.   Hemoglobin markedly declined this AM down to 4.2. Three units of pRBCs ordered with two units administered so far. Will order one unit of FFP and one time dose of Vitamin K.  Discussed with Nephrology, Dr. Posey Pronto. Plan to dialyze this afternoon or evening in the ICU unit. Potassium elevated at 5.2, so one time dose of Lokelma ordered.   Signed, Dr. Jose Persia Internal Medicine PGY-3  11/16/2021, 3:13 PM

## 2021-11-16 NOTE — Interval H&P Note (Signed)
History and Physical Interval Note:  11/16/2021 8:59 AM  Timothy [name]  has presented today for surgery, with the diagnosis of Recurrent GI bleed.  Known duodenal ulcer.  Status post embolization.  The various methods of treatment have been discussed with the patient and family. After consideration of risks, benefits and other options for treatment, the patient has consented to  Procedure(s): COLONOSCOPY WITH PROPOFOL (N/A) ESOPHAGOGASTRODUODENOSCOPY (EGD) WITH PROPOFOL (N/A) as a surgical intervention.  The patient's history has been reviewed, patient examined, no change in status, stable for surgery.  I have reviewed the patient's chart and labs.  Questions were answered to the patient's satisfaction.     [name]

## 2021-11-16 NOTE — TOC Initial Note (Addendum)
Transition of Care [facility]) - Initial/Assessment Note    Patient Details  Name: Timothy [name] MRN: [id number] Date of Birth: May 27, 1961  Transition of Care [facility]) CM/SW Contact:    Milinda Antis, [place] Phone Number: 11/16/2021, 2:37 PM  Clinical Narrative:                  Patient presented to ED after being found down on the bathroom floor at home with rectal bleeding.   Patient receives HD at [facility] on TTS and arrives at 5:40 for 6:00 chair time.  CSW met with the patient at bedside.  The patient was guarded, but informed CSW that he is from home with his wife.  The patient would not provide more information at the time of encounter.    SNF consult received.  PT/ OT orders, assessments, and recommendations are needed before this can be explored.          Patient Goals and CMS Choice        Expected Discharge Plan and Services                                                Prior Living Arrangements/Services                       Activities of Daily Living Home Assistive Devices/Equipment: None ADL Screening (condition at time of admission) Patient's cognitive ability adequate to safely complete daily activities?: Yes Is the patient deaf or have difficulty hearing?: No Does the patient have difficulty seeing, even when wearing glasses/contacts?: No Does the patient have difficulty concentrating, remembering, or making decisions?: No Patient able to express need for assistance with ADLs?: Yes Does the patient have difficulty dressing or bathing?: No Independently performs ADLs?: Yes (appropriate for developmental age) Does the patient have difficulty walking or climbing stairs?: No Weakness of Legs: None Weakness of Arms/Hands: None  Permission Sought/Granted                  Emotional Assessment              Admission diagnosis:  Rectal bleeding [K62.5] GI bleed [K92.2] ESRD (end stage renal disease) on dialysis ([place]) [N18.6,  Z99.2] Patient Active Problem List   Diagnosis Date Noted   Hemorrhagic shock ([place])    Rectal bleeding    Duodenal ulcer with hemorrhage    Upper GI bleed 11/05/2021   GI bleed 11/05/2021   Anemia in ESRD (end-stage renal disease) ([place]) 11/01/2021   Hyperkalemia 11/01/2021   Volume overload 10/31/2021   SOB (shortness of breath) 10/31/2021   Acute hyponatremia 10/31/2021   Multifocal pneumonia 10/06/2021   Hypervolemia associated with renal insufficiency 10/05/2021   Pericardial effusion without cardiac tamponade 10/05/2021   Chronic combined systolic and diastolic CHF (congestive heart failure) ([place]) 10/05/2021   Elevated procalcitonin 10/05/2021   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/2021   Abdominal pain    Renal mass    Major depressive disorder, single episode, severe ([place]) 06/18/2021   Bacterial peritonitis ([place])    Other ascites    Protein-calorie malnutrition, severe 06/13/2021   Ileus ([place]) 06/11/2021   Small bowel obstruction ([place]) 05/20/2021   DOE (dyspnea on exertion) 10/25/2020   Orthopnea 10/25/2020   Open wound of left hand 01/18/2018   Tibial fracture 01/14/2018   Tibial plateau fracture,  left 01/13/2018   Malnutrition of moderate degree 09/20/2016   AKI (acute kidney injury) ([place])    History of colon cancer    Hyponatremia    Leukocytosis    Acute blood loss anemia    Acute metabolic encephalopathy 76/15/1834   ESRD on hemodialysis ([place]) 09/09/2016   Hypertensive emergency 09/09/2016   Acute respiratory failure with hypoxia ([place]) 09/09/2016   Acute pulmonary edema ([place]) 09/09/2016   Substance abuse ([place]) 09/09/2016   Nonadherence to medical treatment 09/09/2016   Anemia due to chronic kidney disease 09/09/2016   Altered mental status 09/09/2016   Acute respiratory failure ([place])    Drug ingestion    Hypertensive heart and chronic kidney disease with heart failure and with stage 5 chronic kidney disease, or end stage renal disease ([place]) 07/19/2016   Chest  pain    Elevated troponin 37/35/7897   Acute diastolic heart failure, NYHA class 2 ([place]) 07/17/2016   Heroin abuse ([place]) 07/17/2016   HTN (hypertension) 07/17/2016   Benign prostate hyperplasia 07/16/2015   PCP:  [name], MD Pharmacy:   [id number], [place] [address] Phone: [phone] Fax: [place], [place] [address] Phone: [phone] Fax: [phone]     Social Determinants of Health (SDOH) Interventions    Readmission Risk Interventions Readmission Risk Prevention Plan 11/16/2021 11/09/2021  Transportation Screening - Complete  Medication Review (RN Care Manager) Referral to Pharmacy Referral to Pharmacy  PCP or Specialist appointment within 3-5 days of discharge Not Complete Complete  PCP/Specialist Appt Not Complete comments patient not medically ready to d/c -  [place] or Home Care Consult Not Complete Patient refused  [place] or Home Care Consult Pt Refusal Comments not complete, waiting on PT recs -  SW Recovery Care/Counseling Consult Complete Complete  Palliative Care Screening Not Applicable Not Applicable  Skilled Nursing Facility Not Complete Not Applicable  SNF Comments Pending PT recommendations (CSW following) -  Some recent data might be hidden

## 2021-11-16 NOTE — Progress Notes (Signed)
ABG not obtained.  MD aware.  Labs were drawn for hgb.

## 2021-11-16 NOTE — Op Note (Addendum)
[facility] Patient Name: Timothy [name] Procedure Date : 11/16/2021 MRN: [id number] Attending MD: Georgian Co ,  Date of Birth: 10-13-1961 CSN: [id number] Age: 61 Admit Type: Inpatient Procedure:                Upper GI endoscopy Indications:              Hematochezia, Melena Providers:                Adline Mango" Lady Deutscher, RN, Cherylynn Ridges, Fisher Referring MD:             Hospital team Medicines:                Monitored Anesthesia Care Complications:            No immediate complications. Estimated Blood Loss:     Estimated blood loss was minimal. Procedure:                Pre-Anesthesia Assessment:                           - Prior to the procedure, a History and Physical                            was performed, and patient medications and                            allergies were reviewed. The patient's tolerance of                            previous anesthesia was also reviewed. The risks                            and benefits of the procedure and the sedation                            options and risks were discussed with the patient.                            All questions were answered, and informed consent                            was obtained. Prior Anticoagulants: The patient has                            taken no previous anticoagulant or antiplatelet                            agents. ASA Grade Assessment: II - A patient with                            mild systemic disease. After reviewing the risks  and benefits, the patient was deemed in                            satisfactory condition to undergo the procedure.                           After obtaining informed consent, the endoscope was                            passed under direct vision. Throughout the                            procedure, the patient's blood pressure, pulse, and                             oxygen saturations were monitored continuously. The                            GIF-H190 ([id number]) Olympus endoscope was introduced                            through the mouth, and advanced to the second part                            of duodenum. Scope In: Scope Out: Findings:      The examined esophagus was normal.      Clotted blood was found in the gastric body.      One spurting cratered duodenal ulcer with spurting hemorrhage (Forrest       Class Ia) was found in the duodenal bulb. The lesion was 20 mm in       largest dimension. Area was successfully injected with 8 mL of a       1:10,000 solution of epinephrine for hemostasis, which seemed to slow       down the pace of the bleeding to oozing. Visualization was still       difficult despite epinephrine injection. For hemostasis, hemostatic clip       placement was attempted three times to trip to close the edges of the       ulcer base. Unfortunately due to stiffness of the ulcer bed, the clips       that were deployed were not able to close the ulcer bed or significantly       stop the bleeding. Coagulation for hemostasis using bipolar probe was       unsuccessful due to poor adherence to the ulcer bed from ongoing       bleeding. To stop active bleeding, hemostatic spray was deployed at the       end. Impression:               - Normal esophagus.                           - Clotted blood in the gastric body.                           - Non-obstructing spurting duodenal ulcer with  spurting hemorrhage (Forrest Class Ia). Injected                            with epinephrine, which helped slow down the                            bleeding to an oozing pace. 3 clip placements                            attempted but not able to close the ulcer bed.                            Treated with bipolar cautery without success.                            Hemostatic spray applied at the end of  the                            procedure.                           - No specimens collected. Recommendation:           - Return patient to hospital ward for ongoing care.                           - It is clear that the patient's recurrent melena                            and hematochezia is due to an actively bleeding                            duodenal ulcer. Hemospray was used as a temporizing                            measure to stop ongoing bleeding but will likely                            only last for about 24 hours. I will plan to                            discuss this case with IR to see if they are                            willing to look for additional vessels that could                            be targets for embolization to stop the patient's                            bleeding (clips could be targeted). If IR is not  able to stop the bleeding, surgery may need to be                            contacted for consideration of resection. At this                            time I do not think that a repeat EGD would be                            beneficial to the patient.                           - Colonoscopy was not pursued today due to concerns                            about ongoing bleeding and discovery of the source                            of the patient's blood loss.                           - Use a proton pump inhibitor PO BID.                           - The findings and recommendations were discussed                            with the patient and primary team. Procedure Code(s):        --- Professional ---                           [id number], Esophagogastroduodenoscopy, flexible,                            transoral; with control of bleeding, any method Diagnosis Code(s):        --- Professional ---                           K92.2, Gastrointestinal hemorrhage, unspecified                           K26.4, Chronic or unspecified  duodenal ulcer with                            hemorrhage                           K92.1, Melena (includes Hematochezia) CPT copyright 2019 [employer]. All rights reserved. The codes documented in this report are preliminary and upon coder review may  be revised to meet current compliance requirements. Georgian Co,  11/16/2021 10:28:56 AM Number of Addenda: 0

## 2021-11-16 NOTE — Sedation Documentation (Addendum)
Critical Value HGB 3.9 called. Dr. Aileen Fass informed of critical lab value and stated she would order blood products to be administered.

## 2021-11-16 NOTE — Procedures (Signed)
Interventional Radiology Procedure Note  Procedure:  1) Mesenteric angiogram 2) Gelfoam embolization of anterior and posterior pancreaticoduodenal arcade from SMA approach  Findings: Please refer to procedural dictation for full description. No evidence of active hemorrhage in duodenum. Diffusely irregular, spasmed vessels.  Gelfoam slurry embolization of anterior and posterior pancreaticoduodenal arcades from SMA.  Right CFA access, 8 Fr Angioseal closure.  Complications: None immediate.  Estimated Blood Loss: 10 mL  Recommendations: Strict 4 hour bedrest.  Head of bed flat for 2 hours, then up to 30 degrees for 2 hours. Continue to transfuse as needed. IR will follow along.   Ruthann Cancer, MD Pager: [phone]

## 2021-11-16 NOTE — Progress Notes (Addendum)
Patient ID: Timothy [name], male   DOB: 1960-12-14, 61 y.o.   MRN: [id number] [facility] Progress Note   Assessment/ Plan:   1.  Recurrent GI bleeding: EGD earlier this admission showed normal esophagus, small hiatal hernia and nonbleeding DU with an adherent clot-recommendations noted for PPI/sucralfate use.  Unfortunately developed recurrent hematochezia yesterday with associated acute blood loss anemia/hemorrhagic shock prompting transfer to ICU.  Treated with PRBC transfusion and en route for EGD this morning. 2. ESRD: On hemodialysis on a TTS schedule and has been off schedule based on labs/ultrafiltration requirements and need for GI procedures.  He will undergo hemodialysis later today in the ICU without any heparin. 3. Anemia: This is secondary to underlying end-stage renal disease/chronic illness and compounded by acute blood loss anemia from GI bleed.  He has received an additional 2 units of PRBC transfusion overnight.  Hemoglobin this morning acceptable at 8.2 up from 6.2. 4. CKD-MBD: Elevated phosphorus level noted, currently n.p.o and will resume binders when diet resumed. 5. Nutrition: He is back on n.p.o. status after earlier being on clear liquids secondary to recurrent GI bleed. 6. Hypertension: Blood pressures "soft" with mild tachycardia-we will limit ultrafiltration goal to maintain hemodynamic stability.  Subjective:   Transferred to ICU yesterday after recurrence of hematochezia with acute blood loss anemia and associated hypotension-determined to have transient hemorrhagic shock.  Complains of being hungry.   Objective:   BP 120/71    Pulse (!) 102    Temp 98.4 F (36.9 C) (Oral)    Resp 15    Ht 6\' 1"  (1.854 m)    Wt 75.1 kg    SpO2 96%    BMI 21.84 kg/m   Physical Exam: Gen: Appears fatigued, resting on his side in bed. CVS: Pulse regular tachycardia, S1 and S2 normal Resp: Diminished breath sounds over bases-poor inspiratory effort, no  rales/rhonchi Abd: Soft, obese, nontender, bowel sounds normal Ext: No lower extremity edema, left upper arm AV fistula with palpable thrill  Labs: BMET Recent Labs  Lab 11/12/21 1501 11/12/21 1526 11/13/21 0113 11/14/21 0745 11/14/21 1015 11/15/21 0233  NA 138 136 132* 133* 131* 130*  K 4.1 4.0 4.2 4.1 3.5 4.0  CL 98 96* 92* 94* 94* 95*  CO2 27  --  25  --  27 22  GLUCOSE 130* 122* 83 67* 80 101*  BUN 41* 40* 53* 34* 35* 21*  CREATININE 4.68* 4.70* 5.02* 3.90* 3.91* 3.03*  CALCIUM 7.7*  --  7.7*  --  7.7* 7.9*  PHOS  --   --   --   --  3.6 3.3   CBC Recent Labs  Lab 11/12/21 1501 11/12/21 1526 11/13/21 0113 11/13/21 0645 11/14/21 1015 11/15/21 0233 11/15/21 0805 11/15/21 1934 11/16/21 0128  WBC 21.2*  --  10.5  --  10.8* 11.5*  --   --   --   HGB 5.1*   < > 6.1*   < > 7.8* 8.2* 6.1* 6.2* 8.2*  HCT 15.7*   < > 18.3*   < > 23.7* 24.3* 18.0* 18.0* 23.1*  MCV 88.2  --  85.5  --  86.5 85.3  --   --   --   PLT 284  --  232  --  262 284  --   --   --    < > = values in this interval not displayed.     Medications:     Chlorhexidine Gluconate Cloth  6 each Topical [id number]  doxercalciferol  2 mcg Intravenous Q T,Th,Sa-HD   [START ON 11/18/2021] pantoprazole  40 mg Intravenous Q12H   sevelamer carbonate  1,600 mg Oral TID with meals   Elmarie Shiley, MD 11/16/2021, 8:09 AM

## 2021-11-16 NOTE — Sedation Documentation (Signed)
BP cuff moved from right leg to left leg, new BP is 124/91. Procedure access point is the right groin/femoral.

## 2021-11-16 NOTE — Anesthesia Postprocedure Evaluation (Signed)
Anesthesia Post Note  Patient: Timothy [name]  Procedure(s) Performed: ESOPHAGOGASTRODUODENOSCOPY (EGD) WITH PROPOFOL SCLEROTHERAPY HEMOSTASIS CLIP PLACEMENT HEMOSTASIS CONTROL HOT HEMOSTASIS (ARGON PLASMA COAGULATION/BICAP)     Patient location during evaluation: PACU Anesthesia Type: MAC Level of consciousness: awake and alert Pain management: pain level controlled Vital Signs Assessment: post-procedure vital signs reviewed and stable Respiratory status: spontaneous breathing, nonlabored ventilation and respiratory function stable Cardiovascular status: blood pressure returned to baseline and stable Postop Assessment: no apparent nausea or vomiting Anesthetic complications: no   No notable events documented.  Last Vitals:  Vitals:   11/16/21 1400 11/16/21 1405  BP: (!) 166/81 (!) 171/70  Pulse: 97 98  Resp: 20 (!) 22  Temp:    SpO2: 100% 100%    Last Pain:  Vitals:   11/16/21 1220  TempSrc:   PainSc: 0-No pain                 Pervis Hocking

## 2021-11-16 NOTE — Progress Notes (Addendum)
[facility] Progress Note Patient Name: Timothy [name] DOB: Aug 02, 1961 MRN: [id number]   Date of Service  11/16/2021  HPI/Events of Note  Nursing reports that the patient is very SOB. Patient with ESRD being held up by Hgb = 4.1 earlier today. Sat = 98%and RR = 19. BP = 185/79 with HR = 100. Patient has received multiple units of blood products today. Suspect volume overload.  eICU Interventions  Plan: Trial of BiPAP.  Spoke with nephrologist on-call (Dr. Johnney Ou) to expedite HD treatment. Will request that PCCM ground team evaluate the patient at bedside.      Intervention Category Major Interventions: Other:  Timothy [name] 11/16/2021, 9:46 PM

## 2021-11-16 NOTE — Sedation Documentation (Signed)
Provider aware of BP 76/45. Blood product rate increased to 934mL/hr

## 2021-11-16 NOTE — Consult Note (Signed)
NAME:  Timothy [name], MRN:  [id number], DOB:  04-Sep-1961, LOS: 4 ADMISSION DATE:  11/12/2021 CONSULTATION DATE:  11/15/2021 REFERRING MD:  [place] Crane - [facility] CHIEF COMPLAINT:  GIB, hypotension   History of Present Illness:  61 year old man who presented to [facility] ED 1/28 via EMS after being found down on the bathroom floor with rectal bleeding.  EMS was called and patient was transferred to [facility] ED. Found to have GI bleed with associated symptomatic anemia (Hgb 5.1).  Transfused 3 units PRBCs.  EGD 1/30 demonstrated duodenal ulcer with overlying adherent clot but no signs of active bleeding.  On 1/31, patient had large volume melena with clots and associated Hgb drop to 6.6.  He became relatively hypotensive with SBPs 80s and mild tachycardia to low 100s.  Received additional 2 units PRBCs.  PCCM was consulted for possible ICU admission/pressor initiation.  Pertinent Medical History:   Past Medical History:  Diagnosis Date   Anemia of chronic kidney failure    BPH (benign prostatic hyperplasia)    Colon cancer ([place]) 2014   End stage renal disease on dialysis ([place]) 2017   Hypertension    Hypertensive heart disease with chronic diastolic congestive heart failure ([place]) 07/17/2016   Polysubstance abuse ([place])    History of heroin and marijuana use   Significant Hospital Events: Including procedures, antibiotic start and stop dates in addition to other pertinent events   1/28 - Presented to [facility] via EMS for rectal bleeding, found down at home. Admitted to [facility]. 1/30 - EGD demonstrated duodenal ulcer with overlying adherent clot but no active bleeding. GI following. 1/31 - Large volume melena with clots, mild tachycardia and hypotension with SBPs 80s. Hgb 6.6, 2U PRBCs ordered. PCCM consulted for possible ICU admission.  Interim History / Subjective:  Pt was transferred to ICU 1/31 despite recommendation that he could stay on progressive care Has remained hemodynamically stable Large BM  melena with clots o/n Hgb 6.2 . 6.1 > 1u PRBC > Hgb 8.2 Patient with some frustration with needing to be hospitalized, has voiced desire to eat, leave AMA. Has been redirectable.    Objective:  Blood pressure 124/64, pulse (!) 102, temperature 98.3 F (36.8 C), temperature source Oral, resp. rate 19, height 6\' 1"  (1.854 m), weight 75.1 kg, SpO2 96 %.        Intake/Output Summary (Last 24 hours) at 11/16/2021 1962 Last data filed at 11/16/2021 0700 Gross per 24 hour  Intake 3507.05 ml  Output --  Net 3507.05 ml   Filed Weights   11/14/21 0204 11/14/21 1422 11/16/21 0500  Weight: 70.4 kg 73 kg 75.1 kg   Physical Examination: General: chronically ill man, no distress HEENT: dry mucous membranes Neuro: awake and alert, answers questions, follows commands, moves all ext CV: regular, 2/6 syst M,  PULM: clear B  GI: non-distended, non-tender, + BS Extremities: no edema Skin: no rash  Resolved Hospital Problem List:    Assessment & Plan:  [name] is seen in consultation at the request of Dr. [place] Crane [facility]) for further evaluation and management of persistent UGIB with associated anemia and hypotension.  Upper GIB > duodenal ulcer Melena [facility] [address] ED via EMS 1/28 for rectal bleeding. Symptomatic anemia noted with Hgb 5.1 requiring 3U PRBCs. EGD completed with GI 1/30 demonstrating duodenal ulcer with adherent clot, but no active bleeding. -following CBC, transfusing PRBC to replace losses -appreciate GI management, planning for repeat EGD 2/1 -all AC on hold   ABLA in  the setting of GIB Noted in the setting of active GIB. S/p 3U PRBCs 1/28, 2U PRBCs 1/31. -following CBC as above -Transfuse for Hgb 7.0 or hemodynamically significant bleeding  Hypotension, resolved Tachycardia -telemetry monotering - Volume resuscitation with PRBCs, fluids as tolerated - Not requiring pressor support at present - Cardiac monitoring  ESRD on HD -appreciate HD management -HD as per  Nephro recs.  -follow BMP, I/O   Best Practice: (right click and "Reselect all SmartList Selections" daily)   Diet/type: NPO DVT prophylaxis: SCD GI prophylaxis: PPI Lines: N/A Foley:  N/A Code Status:  full code Last date of multidisciplinary goals of care discussion [Per Primary Team]  Labs:  CBC: Recent Labs  Lab 11/12/21 1501 11/12/21 1526 11/13/21 0113 11/13/21 0645 11/14/21 1015 11/15/21 0233 11/15/21 0805 11/15/21 1934 11/16/21 0128  WBC 21.2*  --  10.5  --  10.8* 11.5*  --   --   --   HGB 5.1*   < > 6.1*   < > 7.8* 8.2* 6.1* 6.2* 8.2*  HCT 15.7*   < > 18.3*   < > 23.7* 24.3* 18.0* 18.0* 23.1*  MCV 88.2  --  85.5  --  86.5 85.3  --   --   --   PLT 284  --  232  --  262 284  --   --   --    < > = values in this interval not displayed.   Basic Metabolic Panel: Recent Labs  Lab 11/12/21 1501 11/12/21 1526 11/13/21 0113 11/14/21 0745 11/14/21 1015 11/15/21 0233  NA 138 136 132* 133* 131* 130*  K 4.1 4.0 4.2 4.1 3.5 4.0  CL 98 96* 92* 94* 94* 95*  CO2 27  --  25  --  27 22  GLUCOSE 130* 122* 83 67* 80 101*  BUN 41* 40* 53* 34* 35* 21*  CREATININE 4.68* 4.70* 5.02* 3.90* 3.91* 3.03*  CALCIUM 7.7*  --  7.7*  --  7.7* 7.9*  PHOS  --   --   --   --  3.6 3.3   GFR: Estimated Creatinine Clearance: 27.5 mL/min (A) (by C-G formula based on SCr of 3.03 mg/dL (H)). Recent Labs  Lab 11/12/21 1501 11/13/21 0113 11/14/21 1015 11/15/21 0233  WBC 21.2* 10.5 10.8* 11.5*   Liver Function Tests: Recent Labs  Lab 11/12/21 1501 11/14/21 1015 11/15/21 0233  AST 22  --   --   ALT 27  --   --   ALKPHOS 55  --   --   BILITOT 0.5  --   --   PROT 5.6*  --   --   ALBUMIN 2.0* 2.1* 2.1*   No results for input(s): LIPASE, AMYLASE in the last 168 hours. No results for input(s): AMMONIA in the last 168 hours.  ABG:    Component Value Date/Time   PHART 7.394 11/05/2021 1043   PCO2ART 44.4 11/05/2021 1043   PO2ART 82 (L) 11/05/2021 1043   HCO3 27.1 11/05/2021  1043   TCO2 29 11/14/2021 0745   ACIDBASEDEF 4.0 (H) 09/09/2016 0931   O2SAT 96.0 11/05/2021 1043    Coagulation Profile: Recent Labs  Lab 11/13/21 0113  INR 1.5*   Cardiac Enzymes: No results for input(s): CKTOTAL, CKMB, CKMBINDEX, TROPONINI in the last 168 hours.  HbA1C: No results found for: HGBA1C  CBG: Recent Labs  Lab 11/14/21 1149 11/15/21 0745 11/15/21 1148 11/15/21 1531 11/15/21 1905  GLUCAP 142* 71 71 98 89  Critical care time: 24 min    Baltazar Apo, MD, PhD 11/16/2021, 7:35 AM [facility] [phone] or if no answer before 7:00PM call [phone] For any issues after 7:00PM please call eLink [phone]

## 2021-11-16 NOTE — Progress Notes (Signed)
Paged Dr. Fuller Plan regarding patient having large, dark red clots from rectum. No new orders given. Will pass onto day shift RN.

## 2021-11-16 NOTE — Progress Notes (Addendum)
PROGRESS NOTE  Timothy [name] KVQ:[id number] DOB: 1961/07/03 DOA: 11/12/2021 PCP: [name], MD  HPI/Recap of past 24 hours: Timothy [name] is a 61 y.o. male with medical history significant of ESRD HD TTS, HFrEF 40 to 45%, anemia of chronic disease, hypertension, BPH, colon cancer, recent admitted for GI bleed, LA grade A reflux esophagitis, duodenal ulcer with visible vessel seen by EGD (11/05/21) which required hemostatic spray and subsequent IR embolization who presented to [facility] ED from home with recurrent GI bleed.  Patient was found by family member on bathroom floor with large quantity of rectal bleed and called EMS.  EMS arrived and estimated blood loss was about 400 ml.   Work-up revealed acute blood loss anemia from GI bleed with hemoglobin of 5.1K on presentation.  Patient received 3 units PRBCs and Protonix drip on admission.  Post EGD on 11/14/2021.  It revealed duodenal ulcer that had overlying clot but no active signs of bleeding. 11/15/2021 patient had hematochezia and was transfused an additional 2 units PRBCs for hemoglobin of 6.1K, Protonix drip was restarted.    Due to hemodynamic instability patient was transferred to the ICU for closer monitoring.  Night of 11/15/2021 patient had a large melena, hemoglobin dropped to 6.2 from 8.2 (after 2 unit PRBC).  Repeat hemoglobin this morning 8.2 after additional 1 U PRBCs transfusion overnight.  Post EGD on 11/16/2021 which showed actively bleeding duodenal ulcer. Treated with epi injection which seemed to slow it down but was still oozing. Clips and bipolar cauterization attempted but not successful. Thus hemospray was deployed but will likely only last for about 24 hours. GI discussed with Dr. Serafina Royals with IR and they will plan for embolization.  Continue NPO.  Patient has been seen by GI, nephrology, and PCCM.  Appreciate specialists assistance.  Will remain in the ICU until hemodynamically stable.  11/16/2021: Patient was seen and  examined at his bedside while in the ICU.  States he wants to eat.  He denies any abdominal pain or nausea.   Assessment/Plan: Principal Problem:   GI bleed Active Problems:   Duodenal ulcer with hemorrhage   Rectal bleeding   Hemorrhagic shock (HCC)  Acute blood loss anemia secondary to recurrent GI bleed, from duodenal ulcer post EGD on 11/14/2021 and 11/16/21. Presented with hemoglobin of 5.1K with symptomatic anemia. Thus far has been transfused total of 6 unit PRBCs. Hemoglobin this morning 8.2 post 6 unit PRBC transfusion from initial hemoglobin of 5.1 on admission. Post EGD on 11/14/2021 and again on 11/16/2021 due to recurrent GI bleed.  EGD on 11/16/2021 which showed actively bleeding duodenal ulcer. Treated with epi injection which seemed to slow it down but was still oozing. Clips and bipolar cauterization attempted but not successful. Thus hemospray was deployed but will likely only last for about 24 hours. GI discussed with Dr. Serafina Royals with IR and they will plan for embolization.  Continue NPO. Continue IV Protonix.   Addendum: Hg dropped 4.1K and 3 U PRBCs ordered to be transfused. Patient received 1 U FFP and vitamin K 10 mg x 1.  Bleeding duodenal ulcer seen on EGD 11/16/2021 Management as stated above  Leukocytosis, reactive in the setting of active GI bleed Afebrile. Continue to monitor WBC.  Moderate protein calorie malnutrition N.p.o. for now due to active GI bleed. Plan for IR embolization on 11/16/2021  Resolved acute hypoxic respiratory failure secondary to mild pulm edema seen on chest x-ray. Personally reviewed chest x-ray done on 11/13/2021 showing increase in pulmonary  vascularity suggestive of mild pulmonary edema. Not on supplementation at baseline. Post HD. Currently on room air with O2 saturation above 95%.  ESRD on HD TTS Last outpatient hemodialysis 11/12/2021 and 11/13/2021. Appreciate nephrology's assistance   Coagulopathy -INR elevated on last 2  admissions INR 1.5 on 11/13/2021. LFTs within normal limit.   Question of hemochromatosis -Outpatient follow-up with hematology   History of pericardial effusion -Small amount on recent echo, consider recheck echo if after medically stable still having hypoxia   HTN -Hold all BP meds due to active GI bleed. Maintain MAP greater than 65. Not on IV fluid hydration due to ESRD.  Hyperkalemia K+ 5.2 Lokelma 10 mg x1 given    Critical care time: 65 minutes.      DVT prophylaxis: SCD, pharmacological DVT prophylaxis is contraindicated in the setting of ongoing GI bleed Code Status: Full code Family Communication: None at bedside. Disposition Plan: Likely will discharge to home if no recurrent GI bleeding. Consults called: GI, general surgery, nephrology, PCCM. Admission status: Inpatient status.      Status is: Inpatient  Patient will require at least 2 midnights for further evaluation and treatment of present condition.      Objective: Vitals:   11/16/21 0801 11/16/21 0830 11/16/21 1005 11/16/21 1020  BP: 120/62 (!) 150/101 (!) 151/63 125/67  Pulse:  (!) 105 (!) 103 (!) 104  Resp: 20 15 (!) 29 (!) 26  Temp:  98.2 F (36.8 C) (!) 97.4 F (36.3 C)   TempSrc:  Temporal    SpO2:   92% 97%  Weight:  75.1 kg    Height:  6\' 1"  (1.854 m)      Intake/Output Summary (Last 24 hours) at 11/16/2021 1112 Last data filed at 11/16/2021 6767 Gross per 24 hour  Intake 3767.05 ml  Output 400 ml  Net 3367.05 ml   Filed Weights   11/14/21 1422 11/16/21 0500 11/16/21 0830  Weight: 73 kg 75.1 kg 75.1 kg    Exam:  General: 61 y.o. year-old male frail-appearing no acute distress.  He is alert and oriented x3.   Cardiovascular: Regular rate and rhythm no rubs or gallops. Respiratory: Clear to auscultation no wheezes or rales. Abdomen: Soft monitor normal bowel sounds present. Musculoskeletal: No lower extremity edema bilaterally. Skin: No ulcerative lesions noted. Psychiatry:  Mood is irritable. Neuro: Moves all 4 extremities.  Nonfocal exam.   Data Reviewed: CBC: Recent Labs  Lab 11/12/21 1501 11/12/21 1526 11/13/21 0113 11/13/21 0645 11/14/21 1015 11/15/21 0233 11/15/21 0805 11/15/21 1934 11/16/21 0128  WBC 21.2*  --  10.5  --  10.8* 11.5*  --   --   --   HGB 5.1*   < > 6.1*   < > 7.8* 8.2* 6.1* 6.2* 8.2*  HCT 15.7*   < > 18.3*   < > 23.7* 24.3* 18.0* 18.0* 23.1*  MCV 88.2  --  85.5  --  86.5 85.3  --   --   --   PLT 284  --  232  --  262 284  --   --   --    < > = values in this interval not displayed.   Basic Metabolic Panel: Recent Labs  Lab 11/12/21 1501 11/12/21 1526 11/13/21 0113 11/14/21 0745 11/14/21 1015 11/15/21 0233  NA 138 136 132* 133* 131* 130*  K 4.1 4.0 4.2 4.1 3.5 4.0  CL 98 96* 92* 94* 94* 95*  CO2 27  --  25  --  27  22  GLUCOSE 130* 122* 83 67* 80 101*  BUN 41* 40* 53* 34* 35* 21*  CREATININE 4.68* 4.70* 5.02* 3.90* 3.91* 3.03*  CALCIUM 7.7*  --  7.7*  --  7.7* 7.9*  PHOS  --   --   --   --  3.6 3.3   GFR: Estimated Creatinine Clearance: 27.5 mL/min (A) (by C-G formula based on SCr of 3.03 mg/dL (H)). Liver Function Tests: Recent Labs  Lab 11/12/21 1501 11/14/21 1015 11/15/21 0233  AST 22  --   --   ALT 27  --   --   ALKPHOS 55  --   --   BILITOT 0.5  --   --   PROT 5.6*  --   --   ALBUMIN 2.0* 2.1* 2.1*   No results for input(s): LIPASE, AMYLASE in the last 168 hours. No results for input(s): AMMONIA in the last 168 hours. Coagulation Profile: Recent Labs  Lab 11/13/21 0113  INR 1.5*   Cardiac Enzymes: No results for input(s): CKTOTAL, CKMB, CKMBINDEX, TROPONINI in the last 168 hours. BNP (last 3 results) No results for input(s): PROBNP in the last 8760 hours. HbA1C: No results for input(s): HGBA1C in the last 72 hours. CBG: Recent Labs  Lab 11/14/21 1149 11/15/21 0745 11/15/21 1148 11/15/21 1531 11/15/21 1905  GLUCAP 142* 71 71 98 89   Lipid Profile: No results for input(s): CHOL,  HDL, LDLCALC, TRIG, CHOLHDL, LDLDIRECT in the last 72 hours. Thyroid Function Tests: No results for input(s): TSH, T4TOTAL, FREET4, T3FREE, THYROIDAB in the last 72 hours. Anemia Panel: No results for input(s): VITAMINB12, FOLATE, FERRITIN, TIBC, IRON, RETICCTPCT in the last 72 hours. Urine analysis:    Component Value Date/Time   COLORURINE YELLOW 05/19/2021 2015   APPEARANCEUR HAZY (A) 05/19/2021 2015   LABSPEC 1.012 05/19/2021 2015   PHURINE 9.0 (H) 05/19/2021 2015   GLUCOSEU NEGATIVE 05/19/2021 2015   HGBUR NEGATIVE 05/19/2021 2015   [place] NEGATIVE 05/19/2021 2015   KETONESUR NEGATIVE 05/19/2021 2015   PROTEINUR >=300 (A) 05/19/2021 2015   NITRITE NEGATIVE 05/19/2021 2015   LEUKOCYTESUR SMALL (A) 05/19/2021 2015   Sepsis Labs: @LABRCNTIP (procalcitonin:4,lacticidven:4)  ) Recent Results (from the past 240 hour(s))  Resp Panel by RT-PCR (Flu A&B, Covid) Nasopharyngeal Swab     Status: None   Collection Time: 11/12/21  4:22 PM   Specimen: Nasopharyngeal Swab; Nasopharyngeal(NP) swabs in vial transport medium  Result Value Ref Range Status   SARS Coronavirus 2 by RT PCR NEGATIVE NEGATIVE Final    Comment: (NOTE) SARS-CoV-2 target nucleic acids are NOT DETECTED.  The SARS-CoV-2 RNA is generally detectable in upper respiratory specimens during the acute phase of infection. The lowest concentration of SARS-CoV-2 viral copies this assay can detect is 138 copies/mL. A negative result does not preclude SARS-Cov-2 infection and should not be used as the sole basis for treatment or other patient management decisions. A negative result may occur with  improper specimen collection/handling, submission of specimen other than nasopharyngeal swab, presence of viral mutation(s) within the areas targeted by this assay, and inadequate number of viral copies(<138 copies/mL). A negative result must be combined with clinical observations, patient history, and  epidemiological information. The expected result is Negative.  Fact Sheet for Patients:  EntrepreneurPulse.com.au  Fact Sheet for Healthcare Providers:  IncredibleEmployment.be  This test is no t yet approved or cleared by the [place] FDA and  has been authorized for detection and/or diagnosis of SARS-CoV-2 by FDA under an Emergency Use  Authorization (EUA). This EUA will remain  in effect (meaning this test can be used) for the duration of the COVID-19 declaration under Section 564(b)(1) of the Act, 21 U.S.C.section 360bbb-3(b)(1), unless the authorization is terminated  or revoked sooner.       Influenza A by PCR NEGATIVE NEGATIVE Final   Influenza B by PCR NEGATIVE NEGATIVE Final    Comment: (NOTE) The Xpert Xpress SARS-CoV-2/FLU/RSV plus assay is intended as an aid in the diagnosis of influenza from Nasopharyngeal swab specimens and should not be used as a sole basis for treatment. Nasal washings and aspirates are unacceptable for Xpert Xpress SARS-CoV-2/FLU/RSV testing.  Fact Sheet for Patients: EntrepreneurPulse.com.au  Fact Sheet for Healthcare Providers: IncredibleEmployment.be  This test is not yet approved or cleared by the [place] FDA and has been authorized for detection and/or diagnosis of SARS-CoV-2 by FDA under an Emergency Use Authorization (EUA). This EUA will remain in effect (meaning this test can be used) for the duration of the COVID-19 declaration under Section 564(b)(1) of the Act, 21 U.S.C. section 360bbb-3(b)(1), unless the authorization is terminated or revoked.  Performed at [facility], [address], [place], [place] [address]       Studies: No results found.  Scheduled Meds:  Chlorhexidine Gluconate Cloth  6 each Topical Q0600   doxercalciferol  2 mcg Intravenous Q T,Th,Sa-HD   pantoprazole (PROTONIX) IV  40 mg Intravenous Q12H   sevelamer  carbonate  1,600 mg Oral TID with meals    Continuous Infusions:  sodium chloride     sodium chloride     pantoprazole 300 mL/hr at 11/15/21 1257   sodium chloride       LOS: 4 days     Kayleen Memos, MD [facility] Pager [phone]  If 7PM-7AM, please contact night-coverage [email or web address] Password [email or web address] 11/16/2021, 11:12 AM

## 2021-11-16 NOTE — Progress Notes (Signed)
Asked to assess patient by eMD.  Complete PRBC transfusion several hours ago.  Waiting on f/u Hb result.  Has increased dyspnea after receiving blood products.  SpO2 97% on room air.  Speaking in full sentences.  Does not feel his breathing is too labored.  Has b/l rales on ausculation.  Will keep Bipap on stand by for now.  Repeat Hb being drawn.  If level acceptable, then will proceed with dialysis which should improve his respiratory status.  Chesley Mires, MD [place] Pager - [phone] 11/16/2021, 10:11 PM

## 2021-11-16 NOTE — Transfer of Care (Signed)
Immediate Anesthesia Transfer of Care Note  Patient: Timothy [name]  Procedure(s) Performed: ESOPHAGOGASTRODUODENOSCOPY (EGD) WITH PROPOFOL SCLEROTHERAPY HEMOSTASIS CLIP PLACEMENT HEMOSTASIS CONTROL HOT HEMOSTASIS (ARGON PLASMA COAGULATION/BICAP)  Patient Location: PACU  Anesthesia Type:MAC  Level of Consciousness: drowsy and patient cooperative  Airway & Oxygen Therapy: Patient Spontanous Breathing and Patient connected to nasal cannula oxygen  Post-op Assessment: Report given to RN and Post -op Vital signs reviewed and stable  Post vital signs: Reviewed and stable  Last Vitals:  Vitals Value Taken Time  BP 151/63 11/16/21 1005  Temp    Pulse 103 11/16/21 1007  Resp 33 11/16/21 1007  SpO2 92 % 11/16/21 1007  Vitals shown include unvalidated device data.  Last Pain:  Vitals:   11/16/21 0830  TempSrc: Temporal  PainSc: 8       Patients Stated Pain Goal: 0 (42/68/34 1962)  Complications: No notable events documented.

## 2021-11-16 NOTE — Progress Notes (Signed)
Pt receives out-pt HD at [facility] on TTS. Pt arrives at 5:40 for 6:00 chair time. Will assist as needed.   [name] Renal Navigator [phone]

## 2021-11-16 NOTE — H&P (Signed)
Chief Complaint: Patient was seen in consultation today for embolization of bleeding duodenal ulcer at the request of Dr. Darlyn Chamber  Referring Physician(s): Dr. Darlyn Chamber  Supervising Physician: Ruthann Cancer  Patient Status: [facility] - In-pt  History of Present Illness: Timothy [name] is a 61 y.o. male w/ PMH of ESRD on HD, anemia, HTN, CHF, polysubstance abuse.Pt underwent GDA embolization of dudenal ulcer 11/05/21. EGD yesterday showed no active bleed. This am pt became hemodynamically unstable. Pt had EGD this morning for bleeding duodenal ulcer that was was not able to be stopped completely. PA states that hemospray used for temporary control of bleed. Request received from Dr. Darlyn Chamber to embolize duodenal bleed in IR. Dr. Serafina Royals has approved embolization procedure.   Past Medical History:  Diagnosis Date   Anemia of chronic kidney failure    BPH (benign prostatic hyperplasia)    Colon cancer [facility]) 2014   spouse states he had surgury for colon CA   End stage renal disease on dialysis ([place]) 2017   Hypertension    Hypertensive heart disease with chronic diastolic congestive heart failure ([place]) 07/17/2016   Polysubstance abuse ([place])    History of heroin and marijuana use    Past Surgical History:  Procedure Laterality Date   ABDOMINAL SURGERY     AV FISTULA PLACEMENT Left 09/19/2016   Procedure: Left arm Radiocephalic ARTERIOVENOUS (AV) FISTULA CREATION;  Surgeon: Conrad Palmyra, MD;  Location: [place];  Service: Vascular;  Laterality: Left;   [place] Left 07/09/2017   Procedure: BRACHIOCEPHALIC FISTULA CREATION;  Surgeon: Conrad Makanda, MD;  Location: [place];  Service: Vascular;  Laterality: Left;   COLON SURGERY  2014   ESOPHAGOGASTRODUODENOSCOPY (EGD) WITH PROPOFOL N/A 11/05/2021   Procedure: ESOPHAGOGASTRODUODENOSCOPY (EGD) WITH PROPOFOL;  Surgeon: Carol Ada, MD;  Location: [place];  Service: Endoscopy;  Laterality: N/A;   ESOPHAGOGASTRODUODENOSCOPY  (EGD) WITH PROPOFOL N/A 11/14/2021   Procedure: ESOPHAGOGASTRODUODENOSCOPY (EGD) WITH PROPOFOL;  Surgeon: [name], MD;  Location: [place];  Service: Gastroenterology;  Laterality: N/A;   HEMOSTASIS CONTROL  11/05/2021   Procedure: HEMOSTASIS CONTROL;  Surgeon: Carol Ada, MD;  Location: [place];  Service: Endoscopy;;   INSERTION OF DIALYSIS CATHETER N/A 09/19/2016   Procedure: INSERTION OF TUNNELED DIALYSIS CATHETER;  Surgeon: Conrad , MD;  Location: [place];  Service: Vascular;  Laterality: N/A;   IR ANGIOGRAM VISCERAL SELECTIVE  11/05/2021   IR EMBO ART  VEN HEMORR LYMPH EXTRAV  INC GUIDE ROADMAPPING  11/05/2021   IR GENERIC HISTORICAL  09/14/2016   IR US GUIDE VASC ACCESS RIGHT 09/14/2016 [name], DO [facility]-INTERV RAD   IR GENERIC HISTORICAL  09/14/2016   IR FLUORO GUIDE CV LINE RIGHT 09/14/2016 [name], DO [facility]   IR PARACENTESIS  06/22/2021   IR US GUIDE [place] RIGHT  11/05/2021   LAPAROTOMY N/A 05/23/2021   Procedure: EXPLORATORY LAPAROTOMY LYSIS ADHESIONS;  Surgeon: [name], MD;  Location: [place];  Service: General;  Laterality: N/A;   ORIF TIBIA PLATEAU Left 01/15/2018   Procedure: OPEN REDUCTION INTERNAL FIXATION (ORIF) TIBIAL PLATEAU;  Surgeon: Altamese Spencer, MD;  Location: [place];  Service: Orthopedics;  Laterality: Left;   SCLEROTHERAPY  11/05/2021   Procedure: Clide Deutscher;  Surgeon: Carol Ada, MD;  Location: [facility] ENDOSCOPY;  Service: Endoscopy;;   TRANSTHORACIC ECHOCARDIOGRAM  07/2016    EF 60-65%, No RWMA. Mod Concentric LVH - Gr 2 DD. Severe LA dilation. PAP ~35 mmHg (mild Pulm HTN)  -->  no changes noted 1 month later    Allergies: Patient has no known allergies.  Medications: Prior to Admission medications   Medication Sig Start Date End Date Taking? Authorizing Provider  carvedilol (COREG) 6.25 MG tablet Take 1 tablet (6.25 mg total) by mouth 2 (two) times daily with a meal. 10/09/21  Yes Sharen Hones, MD  LIDOCAINE-PRILOCAINE  EX Apply 1 application topically See admin instructions. Prior to dialysis Tuesday,Thursday and saturday   Yes [provider]  Oxycodone HCl 10 MG TABS Take 10 mg by mouth 2 (two) times daily as needed (pain). 10/26/21  Yes [provider]  pantoprazole (PROTONIX) 40 MG tablet Take 1 tablet (40 mg total) by mouth 2 (two) times daily. Patient not taking: Reported on 11/12/2021 11/09/21   Geradine Girt, DO     Family History  Problem Relation Age of Onset   Heart failure Mother        Died at age 72.   Heart attack Mother 93   Hypertension Mother    Diabetes Mellitus II Mother    Other Father        Unknown   Kidney failure Sister        (Oldest Sister)   Other Other        Multiple siblings have started her heart disease, he is not sure of the details.   CAD Nephew     Social History   Socioeconomic History   Marital status: Married    Spouse name: Not on file   Number of children: Not on file   Years of education: Not on file   Highest education level: Not on file  Occupational History   Occupation: disabled  Tobacco Use   Smoking status: Some Days    Packs/day: 0.25    Types: Cigarettes   Smokeless tobacco: Never  Vaping Use   Vaping Use: Never used  Substance and Sexual Activity   Alcohol use: No    Alcohol/week: 7.0 standard drinks    Types: 7 Cans of beer per week   Drug use: Yes    Frequency: 1.0 times per week    Types: Marijuana, Heroin    Comment: reports quitting heroin 15 years ago; can't afford daily marijuana   Sexual activity: Yes    Comment: once a week  Other Topics Concern   Not on file  Social History Narrative   Not on file   Social Determinants of Health   Financial Resource Strain: Not on file  Food Insecurity: Not on file  Transportation Needs: Not on file  Physical Activity: Not on file  Stress: Not on file  Social Connections: Not on file    Review of Systems: A 12 point ROS discussed and pertinent positives are  indicated in the HPI above.  All other systems are negative.  Review of Systems  Gastrointestinal:  Positive for abdominal pain.   Vital Signs: BP 125/67 (BP Location: Right Leg)    Pulse (!) 104    Temp (!) 97.4 F (36.3 C)    Resp (!) 26    Ht 6\' 1"  (1.854 m)    Wt 165 lb 9.1 oz (75.1 kg)    SpO2 97%    BMI 21.84 kg/m   Physical Exam Constitutional:      Appearance: Normal appearance.  HENT:     Head: Normocephalic and atraumatic.     Mouth/Throat:     Mouth: Mucous membranes are moist.     Pharynx: Oropharynx is  clear.  Cardiovascular:     Rate and Rhythm: Tachycardia present. Rhythm irregular.     Pulses: Normal pulses.     Heart sounds: No murmur heard. Pulmonary:     Effort: Pulmonary effort is normal. No respiratory distress.     Breath sounds: Rhonchi present.  Abdominal:     Palpations: Abdomen is soft.     Tenderness: There is no abdominal tenderness.  Skin:    General: Skin is warm and dry.  Neurological:     Mental Status: He is alert and oriented to person, place, and time.  Psychiatric:        Mood and Affect: Mood normal.        Behavior: Behavior normal.        Thought Content: Thought content normal.        Judgment: Judgment normal.    Imaging: CT ABDOMEN PELVIS WO CONTRAST  Result Date: 11/05/2021 CLINICAL DATA:  Status post trauma. EXAM: CT ABDOMEN AND PELVIS WITHOUT CONTRAST TECHNIQUE: Multidetector CT imaging of the abdomen and pelvis was performed following the standard protocol without IV contrast. RADIATION DOSE REDUCTION: This exam was performed according to the departmental dose-optimization program which includes automated exposure control, adjustment of the mA and/or kV according to patient size and/or use of iterative reconstruction technique. COMPARISON:  June 25, 2021 FINDINGS: Lower chest: There is mild cardiomegaly with a large (approximately 1.5 cm) pericardial effusion. This represents a new finding. Diffusely increased attenuation of  the myocardium is noted. Mild to moderate severity areas of atelectasis are seen within the bilateral lower lobes. Hepatobiliary: Diffusely increased attenuation of the liver parenchyma is noted no focal liver abnormality is seen. Numerous gallstones are seen within a partially contracted gallbladder, without evidence of gallbladder wall thickening or biliary dilatation. Pancreas: There is mild pancreatic ductal dilatation (approximately 2 mm), without evidence of surrounding inflammation. Spleen: Normal in size without focal abnormality. Adrenals/Urinary Tract: Adrenal glands are unremarkable. Innumerable bilateral renal cysts are seen without obstructing renal calculi or hydronephrosis. The urinary bladder is empty and subsequently limited in evaluation. Stomach/Bowel: Mild to moderate severity diffuse thickening of the distal esophageal wall is seen. A large amount of ingested material is seen throughout the body of the stomach. Surgically anastomosed bowel is seen within the right anterior aspect of the right upper quadrant and medial aspect of the lower right abdomen, consistent with prior right hemicolectomy. No evidence of bowel dilatation. Vascular/Lymphatic: Aortic atherosclerosis. No enlarged abdominal or pelvic lymph nodes. Reproductive: Prostate is unremarkable. Other: No abdominal wall hernia or abnormality. No abdominopelvic ascites. Musculoskeletal: No acute or significant osseous findings. IMPRESSION: 1. Mild cardiomegaly with a large (approximately 1.5 cm) pericardial effusion. This represents a new finding. 2. Diffusely increased attenuation of the myocardium and liver, consistent with hemochromatosis. 3. Cholelithiasis. 4. Innumerable bilateral renal cysts. 5. Mild to moderate severity diffuse thickening of the distal esophageal wall, which may represent sequelae associated with esophagitis. 6. Findings consistent with prior right hemicolectomy. 7. Aortic atherosclerosis. Aortic Atherosclerosis  (ICD10-I70.0). Electronically Signed   By: Virgina Norfolk M.D.   On: 11/05/2021 01:23   DG Chest 2 View  Result Date: 10/31/2021 CLINICAL DATA:  Chest pain EXAM: CHEST - 2 VIEW COMPARISON:  10/05/2021 FINDINGS: Stable enlarged cardiac silhouette. Interval improvement in pulmonary edema pattern seen on comparison exam. Probable LEFT basilar atelectasis. No pneumothorax pulmonary edema. IMPRESSION: 1. Improvement in pulmonary edema pattern seen on comparison exam. 2. Persistent marked cardiomegaly. Electronically Signed   By: Suzy Bouchard  M.D.   On: 10/31/2021 15:37   CT HEAD WO CONTRAST (5MM)  Result Date: 11/05/2021 CLINICAL DATA:  Fall, head and neck trauma. EXAM: CT HEAD WITHOUT CONTRAST CT CERVICAL SPINE WITHOUT CONTRAST TECHNIQUE: Multidetector CT imaging of the head and cervical spine was performed following the standard protocol without intravenous contrast. Multiplanar CT image reconstructions of the cervical spine were also generated. RADIATION DOSE REDUCTION: This exam was performed according to the departmental dose-optimization program which includes automated exposure control, adjustment of the mA and/or kV according to patient size and/or use of iterative reconstruction technique. COMPARISON:  None. FINDINGS: CT HEAD FINDINGS Brain: No acute hemorrhage, midline shift or mass effect. No extra-axial fluid collection. Mild cerebral atrophy is noted. Vascular: No hyperdense vessel or unexpected calcification. Skull: Normal. Negative for fracture or focal lesion. Sinuses/Orbits: No acute finding. Other: None. CT CERVICAL SPINE FINDINGS Alignment: There is mild retrolisthesis at C4-C5. Skull base and vertebrae: No acute fracture. No primary bone lesion or focal pathologic process. Soft tissues and spinal canal: No prevertebral fluid or swelling. No visible canal hematoma. Disc levels: Intervertebral disc space narrowing and degenerative endplate changes are seen predominantly at C4-C5 and C5-C6  resulting in moderate to severe spinal canal and neural foraminal stenosis. Upper chest: Negative. Other: Atherosclerotic calcification of the carotid arteries. IMPRESSION: 1. No acute intracranial process. 2. No acute fracture in the cervical spine. 3. 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 Electronically Signed   By: Brett Fairy M.D.   On: 11/05/2021 01:21   CT Cervical Spine Wo Contrast  Result Date: 11/05/2021 CLINICAL DATA:  Fall, head and neck trauma. EXAM: CT HEAD WITHOUT CONTRAST CT CERVICAL SPINE WITHOUT CONTRAST TECHNIQUE: Multidetector CT imaging of the head and cervical spine was performed following the standard protocol without intravenous contrast. Multiplanar CT image reconstructions of the cervical spine were also generated. RADIATION DOSE REDUCTION: This exam was performed according to the departmental dose-optimization program which includes automated exposure control, adjustment of the mA and/or kV according to patient size and/or use of iterative reconstruction technique. COMPARISON:  None. FINDINGS: CT HEAD FINDINGS Brain: No acute hemorrhage, midline shift or mass effect. No extra-axial fluid collection. Mild cerebral atrophy is noted. Vascular: No hyperdense vessel or unexpected calcification. Skull: Normal. Negative for fracture or focal lesion. Sinuses/Orbits: No acute finding. Other: None. CT CERVICAL SPINE FINDINGS Alignment: There is mild retrolisthesis at C4-C5. Skull base and vertebrae: No acute fracture. No primary bone lesion or focal pathologic process. Soft tissues and spinal canal: No prevertebral fluid or swelling. No visible canal hematoma. Disc levels: Intervertebral disc space narrowing and degenerative endplate changes are seen predominantly at C4-C5 and C5-C6 resulting in moderate to severe spinal canal and neural foraminal stenosis. Upper chest: Negative. Other: Atherosclerotic calcification of the carotid arteries. IMPRESSION: 1. No acute  intracranial process. 2. No acute fracture in the cervical spine. 3. 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 Electronically Signed   By: Brett Fairy M.D.   On: 11/05/2021 01:21   IR Angiogram Visceral Selective  Result Date: 11/05/2021 INDICATION: Emergent acute upper GI bleed secondary to endoscopy proven duodenal bulb ulcer. Endoscopic hemostasis obtained. Patient now presents for emergent GDA coil embolization. EXAM: ULTRASOUND GUIDANCE FOR VASCULAR ACCESS CELIAC, COMMON HEPATIC, AND GDA ARTERIAL MICRO CATHETERIZATION AND ANGIOGRAMS SUCCESSFUL GDA MICRO COIL EMBOLIZATION MEDICATIONS: NONE. ANESTHESIA/SEDATION: GENERAL ANESTHESIA CONTRAST:  45 cc omni 300 FLUOROSCOPY TIME:  Fluoroscopy Time: 11 minutes 42 seconds (489 mGy). COMPLICATIONS: None immediate. PROCEDURE: Informed  consent was obtained from the patient following explanation of the procedure, risks, benefits and alternatives. The patient understands, agrees and consents for the procedure. All questions were addressed. A time out was performed prior to the initiation of the procedure. Maximal barrier sterile technique utilized including caps, mask, sterile gowns, sterile gloves, large sterile drape, hand hygiene, and Betadine prep. Under sterile conditions, ultrasound micropuncture access performed the right common femoral artery. Images obtained for documentation. 018 guidewire inserted followed by the micro dilator. Five French sheath inserted over a Bentson guidewire. Initially C2 catheter was advanced to select the celiac origin. Catheter position was unstable. Catheter exchanged for a Mickelson catheter. Mickelson catheter utilized to re select the celiac origin. Selective celiac angiogram performed. Celiac: Mild atherosclerotic change of the celiac origin. Celiac axis remains patent. Celiac branches including the splenic, common hepatic, GDA, proper hepatic, and left and right hepatic arteries are all patent. Left gastric  artery is also visualized and patent. No active bleeding demonstrated by angiography. Through the [facility] catheter, a Renegade STC catheter was advanced initially into the common hepatic artery. Common hepatic angiogram performed. Common hepatic: The common, proper, right and left hepatic arteries are visualized and patent. Catheter advanced over a double angled GT Glidewire into the GDA. GDA angiogram performed. GDA: GDA is patent. There is some irregular focal outpouching of the distal GDA noted but without active contrast extravasation. This can be seen with duodenal ulcer disease. Gastroepiploic vasculature is patent. There is vaso constriction throughout the pancreaticoduodenal arcade. GDA micro coil embolization: For coil embolization, 3, 4, and 5 mm IDC 2D interlock coils were deployed. A total of 5 coils were successfully deployed. Post embolization angiogram confirms near complete stasis within the GDA vascular territory. Again, no active arterial GI bleeding demonstrated by angiography. Catheter access removed. Hemostasis obtained with manual compression due to the degree of calcific atherosclerosis in the femoral artery. Patient tolerated procedure well. No immediate complication. IMPRESSION: Successful preventative GDA micro coil embolization emergently for an endoscopically proven duodenal bulb bleeding ulcer. Electronically Signed   By: Jerilynn Mages.  Shick M.D.   On: 11/05/2021 09:36   IR US Guide Vasc Access Right  Result Date: 11/05/2021 INDICATION: Emergent acute upper GI bleed secondary to endoscopy proven duodenal bulb ulcer. Endoscopic hemostasis obtained. Patient now presents for emergent GDA coil embolization. EXAM: ULTRASOUND GUIDANCE FOR VASCULAR ACCESS CELIAC, COMMON HEPATIC, AND GDA ARTERIAL MICRO CATHETERIZATION AND ANGIOGRAMS SUCCESSFUL GDA MICRO COIL EMBOLIZATION MEDICATIONS: NONE. ANESTHESIA/SEDATION: GENERAL ANESTHESIA CONTRAST:  45 cc omni 300 FLUOROSCOPY TIME:  Fluoroscopy Time: 11  minutes 42 seconds (489 mGy). COMPLICATIONS: None immediate. PROCEDURE: Informed consent was obtained from the patient following explanation of the procedure, risks, benefits and alternatives. The patient understands, agrees and consents for the procedure. All questions were addressed. A time out was performed prior to the initiation of the procedure. Maximal barrier sterile technique utilized including caps, mask, sterile gowns, sterile gloves, large sterile drape, hand hygiene, and Betadine prep. Under sterile conditions, ultrasound micropuncture access performed the right common femoral artery. Images obtained for documentation. 018 guidewire inserted followed by the micro dilator. Five French sheath inserted over a Bentson guidewire. Initially C2 catheter was advanced to select the celiac origin. Catheter position was unstable. Catheter exchanged for a Mickelson catheter. Mickelson catheter utilized to re select the celiac origin. Selective celiac angiogram performed. Celiac: Mild atherosclerotic change of the celiac origin. Celiac axis remains patent. Celiac branches including the splenic, common hepatic, GDA, proper hepatic, and left and right hepatic arteries  are all patent. Left gastric artery is also visualized and patent. No active bleeding demonstrated by angiography. Through the [facility] catheter, a Renegade STC catheter was advanced initially into the common hepatic artery. Common hepatic angiogram performed. Common hepatic: The common, proper, right and left hepatic arteries are visualized and patent. Catheter advanced over a double angled GT Glidewire into the GDA. GDA angiogram performed. GDA: GDA is patent. There is some irregular focal outpouching of the distal GDA noted but without active contrast extravasation. This can be seen with duodenal ulcer disease. Gastroepiploic vasculature is patent. There is vaso constriction throughout the pancreaticoduodenal arcade. GDA micro coil embolization: For  coil embolization, 3, 4, and 5 mm IDC 2D interlock coils were deployed. A total of 5 coils were successfully deployed. Post embolization angiogram confirms near complete stasis within the GDA vascular territory. Again, no active arterial GI bleeding demonstrated by angiography. Catheter access removed. Hemostasis obtained with manual compression due to the degree of calcific atherosclerosis in the femoral artery. Patient tolerated procedure well. No immediate complication. IMPRESSION: Successful preventative GDA micro coil embolization emergently for an endoscopically proven duodenal bulb bleeding ulcer. Electronically Signed   By: Jerilynn Mages.  Shick M.D.   On: 11/05/2021 09:36   DG CHEST PORT 1 VIEW  Result Date: 11/13/2021 CLINICAL DATA:  Hypoxia. EXAM: PORTABLE CHEST 1 VIEW COMPARISON:  11/08/2021 and older exams. FINDINGS: Cardiac silhouette is mildly enlarged. No mediastinal or hilar masses. There are linear opacities in both lung bases and in the mid right lung. Hazy opacities noted in the left lower lung, behind the cardiac silhouette. Remainder of the lungs is clear. No pneumothorax. Skeletal structures are grossly intact. IMPRESSION: 1. Findings without convincing change from the most recent prior study allowing for differences in patient positioning and technique. 2. Linear opacities in the lung bases and right mid lung are consistent with atelectasis. Additional hazy opacity in the left lung base is also likely due to atelectasis, possibly with layering pleural fluid. Infection at the left lung base should be considered in the proper clinical setting. 3. No evidence of pulmonary edema. Electronically Signed   By: Lajean Manes M.D.   On: 11/13/2021 10:13   DG Chest Port 1 View  Result Date: 11/08/2021 CLINICAL DATA:  Leukocytosis EXAM: PORTABLE CHEST 1 VIEW COMPARISON:  Previous studies including the examination of 11/05/2021 FINDINGS: Transverse diameter of heart is increased. There are linear densities  in both lower lung fields suggesting scarring or subsegmental atelectasis. Central pulmonary vessels are less prominent. There is improvement in aeration of left parahilar region. There is no significant pleural effusion or pneumothorax. IMPRESSION: Cardiomegaly. There is interval decrease in pulmonary vascular congestion and improvement in aeration of parahilar regions. There residual linear densities in both lower lung fields suggesting scarring or subsegmental atelectasis. Electronically Signed   By: Elmer Picker M.D.   On: 11/08/2021 13:37   DG Chest Port 1 View  Result Date: 11/05/2021 CLINICAL DATA:  Weakness, vomiting blood. EXAM: PORTABLE CHEST 1 VIEW COMPARISON:  None. FINDINGS: The heart is enlarged the mediastinal contours within normal limits. Atherosclerotic calcification of the aorta is noted. The pulmonary vasculature is distended. Interstitial prominence is noted bilaterally. Subsegmental atelectasis is noted at the right lung base. No effusion or pneumothorax. No acute osseous abnormality. IMPRESSION: 1. Cardiomegaly with pulmonary vascular congestion. 2. Interstitial prominence bilaterally, possible edema or infiltrate. Electronically Signed   By: Brett Fairy M.D.   On: 11/05/2021 01:22   IR EMBO ART  VEN HEMORR LYMPH  EXTRAV  INC GUIDE ROADMAPPING  Result Date: 11/05/2021 INDICATION: Emergent acute upper GI bleed secondary to endoscopy proven duodenal bulb ulcer. Endoscopic hemostasis obtained. Patient now presents for emergent GDA coil embolization. EXAM: ULTRASOUND GUIDANCE FOR VASCULAR ACCESS CELIAC, COMMON HEPATIC, AND GDA ARTERIAL MICRO CATHETERIZATION AND ANGIOGRAMS SUCCESSFUL GDA MICRO COIL EMBOLIZATION MEDICATIONS: NONE. ANESTHESIA/SEDATION: GENERAL ANESTHESIA CONTRAST:  45 cc omni 300 FLUOROSCOPY TIME:  Fluoroscopy Time: 11 minutes 42 seconds (489 mGy). COMPLICATIONS: None immediate. PROCEDURE: Informed consent was obtained from the patient following explanation of the  procedure, risks, benefits and alternatives. The patient understands, agrees and consents for the procedure. All questions were addressed. A time out was performed prior to the initiation of the procedure. Maximal barrier sterile technique utilized including caps, mask, sterile gowns, sterile gloves, large sterile drape, hand hygiene, and Betadine prep. Under sterile conditions, ultrasound micropuncture access performed the right common femoral artery. Images obtained for documentation. 018 guidewire inserted followed by the micro dilator. Five French sheath inserted over a Bentson guidewire. Initially C2 catheter was advanced to select the celiac origin. Catheter position was unstable. Catheter exchanged for a Mickelson catheter. Mickelson catheter utilized to re select the celiac origin. Selective celiac angiogram performed. Celiac: Mild atherosclerotic change of the celiac origin. Celiac axis remains patent. Celiac branches including the splenic, common hepatic, GDA, proper hepatic, and left and right hepatic arteries are all patent. Left gastric artery is also visualized and patent. No active bleeding demonstrated by angiography. Through the [facility] catheter, a Renegade STC catheter was advanced initially into the common hepatic artery. Common hepatic angiogram performed. Common hepatic: The common, proper, right and left hepatic arteries are visualized and patent. Catheter advanced over a double angled GT Glidewire into the GDA. GDA angiogram performed. GDA: GDA is patent. There is some irregular focal outpouching of the distal GDA noted but without active contrast extravasation. This can be seen with duodenal ulcer disease. Gastroepiploic vasculature is patent. There is vaso constriction throughout the pancreaticoduodenal arcade. GDA micro coil embolization: For coil embolization, 3, 4, and 5 mm IDC 2D interlock coils were deployed. A total of 5 coils were successfully deployed. Post embolization angiogram  confirms near complete stasis within the GDA vascular territory. Again, no active arterial GI bleeding demonstrated by angiography. Catheter access removed. Hemostasis obtained with manual compression due to the degree of calcific atherosclerosis in the femoral artery. Patient tolerated procedure well. No immediate complication. IMPRESSION: Successful preventative GDA micro coil embolization emergently for an endoscopically proven duodenal bulb bleeding ulcer. Electronically Signed   By: Jerilynn Mages.  Shick M.D.   On: 11/05/2021 09:36   CT ANGIO GI BLEED  Result Date: 11/15/2021 CLINICAL DATA:  Lower GI bleed. Rectal bleeding. End-stage renal disease on dialysis. Recent coil embolization of the gastroduodenal artery for duodenal bulb ulcer. EXAM: CTA ABDOMEN AND PELVIS WITHOUT AND WITH CONTRAST TECHNIQUE: Multidetector CT imaging of the abdomen and pelvis was performed using the standard protocol during bolus administration of intravenous contrast. Multiplanar reconstructed images and MIPs were obtained and reviewed to evaluate the vascular anatomy. RADIATION DOSE REDUCTION: This exam was performed according to the departmental dose-optimization program which includes automated exposure control, adjustment of the mA and/or kV according to patient size and/or use of iterative reconstruction technique. CONTRAST:  49mL OMNIPAQUE IOHEXOL 350 MG/ML SOLN COMPARISON:  11/12/2021 and 06/25/2021 FINDINGS: VASCULAR Aorta: Normal caliber of the abdominal aorta with atherosclerotic calcifications. Negative for dissection. No significant stenosis. Celiac: Patent without evidence of aneurysm, dissection, vasculitis or significant stenosis. Coil embolization  of the gastroduodenal artery. Common hepatic artery and proper hepatic artery are patent. SMA: Patent without evidence of aneurysm, dissection, vasculitis or significant stenosis. Renals: Both renal arteries are patent without evidence of aneurysm, dissection, vasculitis,  fibromuscular dysplasia or significant stenosis. IMA: Patent without evidence of aneurysm, dissection, vasculitis or significant stenosis. Inflow: Patent without evidence of aneurysm, dissection, vasculitis or significant stenosis. Proximal Outflow: Proximal femoral arteries are patent bilaterally. Veins: IVC and proximal iliac veins are patent. Main portal venous system is patent. Review of the MIP images confirms the above findings. NON-VASCULAR Lower chest: Again noted is a focus of consolidation in the posterior right lower lobe that was present on the most recent examination but new since 11/05/2021. This could represent atelectasis versus a small focus of infection. Additional patchy densities in the left lower lobe. Again noted is a moderate amount of pericardial fluid. Hepatobiliary: Probable gallstone. There is no gallbladder distension. No focal liver lesion. No significant biliary dilatation. Pancreas: Chronic pancreatic duct dilatation. No evidence for acute pancreatic inflammation. Spleen: Again noted is low-density structure along the superior aspect of the spleen that measures roughly 2.0 cm and similar to the exam from 06/25/2021. There is no splenic enlargement. Adrenals/Urinary Tract: Normal appearance of the adrenal glands. Both kidneys are atrophic with numerous small cystic structures. Again noted is a hyperdense structure along the medial right kidney that measures 2.9 cm. No significant enhancement in this structure and this likely represents a hemorrhagic or proteinaceous cyst. No hydronephrosis. Urinary bladder is decompressed and poorly characterized. Stomach/Bowel: Postoperative changes likely representing right hemicolectomy. Bowel anastomosis and postsurgical changes in the anterior abdomen. No clear evidence for active GI bleeding. Fluid-filled loops of bowel throughout the abdomen and pelvis. No evidence for an obstructive process. Lymphatic: No significant lymph node enlargement in  the abdomen pelvis. Reproductive: Prostate is unremarkable. Other: Again noted is abdominal ascites. There has been redistribution of the ascites with more fluid along the left side of the abdomen and less fluid in the upper abdomen. Overall, the amount ascites may have decreased since 11/12/2021. Musculoskeletal: No acute bone abnormality. IMPRESSION: VASCULAR 1. No evidence for active GI bleeding. 2. Coil embolization of the gastroduodenal artery. 3. Aorta and main visceral arteries are patent. NON-VASCULAR 1. Ascites. Abdominal ascites may have slightly decreased since 11/12/2021 as described. 2. Small focus of consolidation in the posterior right lower lobe. Findings could represent atelectasis versus small focus of infection. 3. Cholelithiasis.  No evidence for gallbladder distension. 4. Bilateral renal atrophy with numerous bilateral renal cysts. 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 Hyperdense non-enhancing structure in the right kidney probably represents a complex or hemorrhagic cyst. 5. Chronic dilatation of the main pancreatic duct. 6. Postoperative bowel changes. 7. Stable pericardial effusion. Electronically Signed   By: Markus Daft M.D.   On: 11/15/2021 09:13   CT Angio Abd/Pel W and/or Wo Contrast  Result Date: 11/12/2021 CLINICAL DATA:  via EMS; reported found on the floor;rectal bleeding; approx 400 ml blood loss per EMS; reported patient is also a dialysis patient and had a dull session today. Per EMS patient is slow to response; stated prior hx of bleeding ulcers and has had + blood on vomiting also. EXAM: CTA ABDOMEN AND PELVIS WITHOUT AND WITH CONTRAST TECHNIQUE: Multidetector CT imaging of the abdomen and pelvis was performed using the standard protocol during bolus administration of intravenous contrast. Multiplanar reconstructed images and MIPs were obtained and reviewed to evaluate the vascular anatomy. RADIATION DOSE  REDUCTION: This exam was  performed according to the departmental dose-optimization program which includes automated exposure control, adjustment of the mA and/or kV according to patient size and/or use of iterative reconstruction technique. CONTRAST:  150mL OMNIPAQUE IOHEXOL 350 MG/ML SOLN COMPARISON:  CT, 11/05/2021. FINDINGS: VASCULAR Aorta: Normal in caliber. Diffuse atherosclerosis but no significant stenosis. No dissection. Celiac: Mild atherosclerosis at the origin. No significant stenosis. There are micro embolization coils along the gastric duodenal artery, new since the prior CT. No flow seen in the gastric duodenal artery. SMA: Patent without evidence of aneurysm, dissection, vasculitis or significant stenosis. Renals: Both renal arteries are patent without evidence of aneurysm, dissection, vasculitis, fibromuscular dysplasia or significant stenosis. IMA: Small I IMA, not well-defined. Inflow: Common and external iliac artery atherosclerosis with no significant stenosis. Proximal Outflow: Common femoral artery atherosclerosis with no significant stenosis. Patent proximal femoral arteries and deep femoral arteries. Veins: No obvious venous abnormality within the limitations of this arterial phase study. Review of the MIP images confirms the above findings. NON-VASCULAR Lower chest: Focal area of opacity in the dependent right lower lobe, new. This is peribronchovascular and may be infectious or inflammatory but is nonspecific. Hepatobiliary: Unremarkable liver. Gallbladder mostly contracted. No convincing bile duct dilation. Pancreas: Duct is dilated up to 8 mm, measuring larger than on the recent prior CT. No pancreatic mass or convincing inflammation. Spleen: Low-density lesion over the dome of the spleen, 1.5 cm in size. This is consistent with a cyst and is unchanged. No other splenic abnormality. Adrenals/Urinary Tract: No adrenal masses. Marked bilateral renal atrophy with multiple renal masses. The largest of these arises  from the anterior right kidney midpole, has an enhancing rim and is relative increased attenuation compared to other low-density masses. This is consistent with a chronic hemorrhagic cyst, and was previously evaluated with MRI on 06/21/2021. No renal stones. No hydronephrosis. Ureters not well visualized, but no evidence of ureteral dilation. Bladder is decompressed. Stomach/Bowel: Stomach is unremarkable. Small bowel and colon are normal in overall caliber. Right mid abdomen colonic anastomosis staples consistent with an ileocolic anastomosis. Multiple additional vascular clips in the right lower quadrant and right mid abdomen. Small bowel and colon are not well-defined due to adjacent ascites, limiting contrast. No convincing bowel inflammation. There is no extravasation of contrast to indicate an active GI bleeding source. Lymphatic: No enlarged lymph nodes. Reproductive: Unremarkable. Other: Moderate ascites similar to the prior CT. Musculoskeletal: No fracture or acute finding.  No bone lesion. IMPRESSION: VASCULAR 1. No evidence of an active GI bleeding source. 2. No enhancement/flow in the embolized gastro duodenal artery. 3. Aortic atherosclerosis. No aneurysm, dissection or significant stenosis. 4. Small irregular inferior mesenteric artery. Remaining aortic branch vessels are widely patent. NON-VASCULAR 1. No convincing acute findings. 2. Pancreatic duct is more dilated than on the prior CT, up to 8 mm, of unclear etiology. 3. Findings of end-stage renal disease. 4. Moderate ascites similar to the prior CT. 5. Stable changes from previous bowel surgery. Electronically Signed   By: Lajean Manes M.D.   On: 11/12/2021 17:05    Labs:  CBC: Recent Labs    11/12/21 1501 11/12/21 1526 11/13/21 0113 11/13/21 0645 11/14/21 1015 11/15/21 0233 11/15/21 0805 11/15/21 1934 11/16/21 0128  WBC 21.2*  --  10.5  --  10.8* 11.5*  --   --   --   HGB 5.1*   < > 6.1*   < > 7.8* 8.2* 6.1* 6.2* 8.2*  HCT 15.7*    < >  18.3*   < > 23.7* 24.3* 18.0* 18.0* 23.1*  PLT 284  --  232  --  262 284  --   --   --    < > = values in this interval not displayed.    COAGS: Recent Labs    11/04/21 2350 11/07/21 1207 11/13/21 0113  INR 1.5* 1.5* 1.5*    BMP: Recent Labs    11/12/21 1501 11/12/21 1526 11/13/21 0113 11/14/21 0745 11/14/21 1015 11/15/21 0233  NA 138   < > 132* 133* 131* 130*  K 4.1   < > 4.2 4.1 3.5 4.0  CL 98   < > 92* 94* 94* 95*  CO2 27  --  25  --  27 22  GLUCOSE 130*   < > 83 67* 80 101*  BUN 41*   < > 53* 34* 35* 21*  CALCIUM 7.7*  --  7.7*  --  7.7* 7.9*  CREATININE 4.68*   < > 5.02* 3.90* 3.91* 3.03*  GFRNONAA 14*  --  12*  --  17* 23*   < > = values in this interval not displayed.    LIVER FUNCTION TESTS: Recent Labs    11/01/21 0304 11/04/21 2350 11/07/21 0859 11/07/21 1207 11/08/21 0421 11/09/21 0434 11/12/21 1501 11/14/21 1015 11/15/21 0233  BILITOT 1.1 0.9  --  1.1  --   --  0.5  --   --   AST 24 104*  --  48*  --   --  22  --   --   ALT 15 116*  --  66*  --   --  27  --   --   ALKPHOS 91 75  --  78  --   --  55  --   --   PROT 7.4 6.4*  --  6.5  --   --  5.6*  --   --   ALBUMIN 2.7* 2.3*   < > 2.4*   < > 2.4* 2.0* 2.1* 2.1*   < > = values in this interval not displayed.    TUMOR MARKERS: No results for input(s): AFPTM, CEA, CA199, CHROMGRNA in the last 8760 hours.  Assessment and Plan: History of ESRD on HD, anemia, HTN, CHF, polysubstance abuse.Pt underwent GDA embolization of dudenal ulcer 11/05/21. EGD yesterday showed no active bleed. This am pt became hemodynamically unstable. Pt had EGD this morning for bleeding duodenal ulcer that was was not able to be stopped completely. PA states that hemospray used for temporary control of bleed. Request received from Dr. Darlyn Chamber to embolize duodenal bleed in IR. Dr. Serafina Royals has approved embolization procedure.   The Risks and benefits of embolization were discussed with the patient including, but not  limited to bleeding, infection, vascular injury, post operative pain, or contrast induced renal failure.  This procedure involves the use of X-rays and because of the nature of the planned procedure, it is possible that we will have prolonged use of X-ray fluoroscopy.  Potential radiation risks to you include (but are not limited to) the following: - A slightly elevated risk for cancer several years later in life. This risk is typically less than 0.5% percent. This risk is low in comparison to the normal incidence of human cancer, which is 33% for women and 50% for men according to the [employer]. - Radiation induced injury can include skin redness, resembling a rash, tissue breakdown / ulcers and hair loss (which can be temporary or  permanent).   The likelihood of either of these occurring depends on the difficulty of the procedure and whether you are sensitive to radiation due to previous procedures, disease, or genetic conditions.   IF your procedure requires a prolonged use of radiation, you will be notified and given written instructions for further action.  It is your responsibility to monitor the irradiated area for the 2 weeks following the procedure and to notify your physician if you are concerned that you have suffered a radiation induced injury.    All of the patient's questions were answered, patient is agreeable to proceed. Consent signed and in chart.   Thank you for this interesting consult.  I greatly enjoyed meeting Timothy [name] and look forward to participating in their care.  A copy of this report was sent to the requesting provider on this date.  Electronically Signed: Tyson Alias, NP 11/16/2021, 11:05 AM   I spent a total of 20 minutes in face to face in clinical consultation, greater than 50% of which was counseling/coordinating care for embolization of bleeding duodenal ulcer.

## 2021-11-16 NOTE — Progress Notes (Signed)
BiPAP at bedside if needed.  MD aware.  Patient placed on 3L [place].

## 2021-11-16 NOTE — Anesthesia Preprocedure Evaluation (Addendum)
Anesthesia Evaluation  Patient identified by MRN, date of birth, ID band Patient awake    Reviewed: Allergy & Precautions, NPO status , Patient's Chart, lab work & pertinent test results, reviewed documented beta blocker date and time   Airway Mallampati: II  TM Distance: >3 FB Neck ROM: Full    Dental no notable dental hx.    Pulmonary neg pulmonary ROS, Current Smoker,    Pulmonary exam normal breath sounds clear to auscultation       Cardiovascular hypertension, Pt. on medications and Pt. on home beta blockers pulmonary hypertension (mod pHTN)+CHF (LVEF 40%) and + DOE  Normal cardiovascular exam+ Valvular Problems/Murmurs (mod MR, mild AI) MR and AI  Rhythm:Regular Rate:Normal  Echo 09/2021: 1. Left ventricular ejection fraction, by estimation, is 40 to 45%. The  left ventricle has mildly decreased function. The left ventricle  demonstrates global hypokinesis. There is moderate concentric left  ventricular hypertrophy. Left ventricular  diastolic function could not be evaluated.  2. Right ventricular systolic function is low normal. The right  ventricular size is moderately enlarged. Moderately increased right  ventricular wall thickness. There is moderately elevated pulmonary artery  systolic pressure.  3. A small pericardial effusion is present. The pericardial effusion is  circumferential. There is no evidence of cardiac tamponade.  4. Moderate mitral valve regurgitation.  5. Tricuspid valve regurgitation is moderate to severe.  6. The aortic valve is tricuspid. There is mild calcification of the  aortic valve. There is moderate thickening of the aortic valve. Aortic  valve regurgitation is mild.  7. The inferior vena cava is dilated in size with <50% respiratory  variability, suggesting right atrial pressure of 15 mmHg.    Neuro/Psych PSYCHIATRIC DISORDERS Depression negative neurological ROS      GI/Hepatic PUD, GERD  Medicated and Controlled,(+)     substance abuse (hx marijuana, heroin)  marijuana use and IV drug use, Recurrent GIB 2/2 duodenal ulcer, s/p embolization Hx CRC 2014    Endo/Other  negative endocrine ROS  Renal/GU ESRF and DialysisRenal diseaseCr 3.03  negative genitourinary   Musculoskeletal negative musculoskeletal ROS (+)   Abdominal   Peds negative pediatric ROS (+)  Hematology  (+) Blood dyscrasia, anemia , Hb 8.2 this AM from 6.2 yesterday  INR 1.5   Anesthesia Other Findings Chronic oxy 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   Reproductive/Obstetrics negative OB ROS                           Anesthesia Physical Anesthesia Plan  ASA: 3  Anesthesia Plan: MAC   Post-op Pain Management:    Induction:   PONV Risk Score and Plan: 2 and Propofol infusion and TIVA  Airway Management Planned: Natural Airway and Simple Face Mask  Additional Equipment: None  Intra-op Plan:   Post-operative Plan:   Informed Consent: I have reviewed the patients History and Physical, chart, labs and discussed the procedure including the risks, benefits and alternatives for the proposed anesthesia with the patient or authorized representative who has indicated his/her understanding and acceptance.       Plan Discussed with: CRNA  Anesthesia Plan Comments:         Anesthesia Quick Evaluation

## 2021-11-16 NOTE — Progress Notes (Signed)
Nephrology follow up --  Discussed case with dialysis RN -- noted patient's Hb 4.1 at 11:48 check.  Appears went for embolization in IR but no active bleeding identified.   3u pRBC have been ordered and he's in process of receiving.  Asked RN to have stat Hb checked after 2nd unit and if Hb > 6 proceed with dialysis, transfusing 3rd unit with the dialysis treatment.

## 2021-11-17 ENCOUNTER — Inpatient Hospital Stay (HOSPITAL_COMMUNITY): Payer: Medicare (Managed Care)

## 2021-11-17 DIAGNOSIS — G8918 Other acute postprocedural pain: Secondary | ICD-10-CM

## 2021-11-17 DIAGNOSIS — K264 Chronic or unspecified duodenal ulcer with hemorrhage: Secondary | ICD-10-CM | POA: Diagnosis not present

## 2021-11-17 DIAGNOSIS — R109 Unspecified abdominal pain: Secondary | ICD-10-CM

## 2021-11-17 DIAGNOSIS — K922 Gastrointestinal hemorrhage, unspecified: Secondary | ICD-10-CM | POA: Diagnosis not present

## 2021-11-17 DIAGNOSIS — R578 Other shock: Secondary | ICD-10-CM | POA: Diagnosis not present

## 2021-11-17 LAB — TYPE AND SCREEN
ABO/RH(D): B POS
Antibody Screen: NEGATIVE
Unit division: 0
Unit division: 0
Unit division: 0
Unit division: 0
Unit division: 0
Unit division: 0

## 2021-11-17 LAB — BPAM RBC
Blood Product Expiration Date: 202302032359
Blood Product Expiration Date: 202302032359
Blood Product Expiration Date: 202302162359
Blood Product Expiration Date: 202302212359
Blood Product Expiration Date: 202302212359
Blood Product Expiration Date: 202302222359
ISSUE DATE / TIME: 202301310944
ISSUE DATE / TIME: 202301311305
ISSUE DATE / TIME: 202301312054
ISSUE DATE / TIME: 202302011307
ISSUE DATE / TIME: 202302011307
ISSUE DATE / TIME: 202302011307
Unit Type and Rh: 1700
Unit Type and Rh: 1700
Unit Type and Rh: 7300
Unit Type and Rh: 7300
Unit Type and Rh: 7300
Unit Type and Rh: 7300

## 2021-11-17 LAB — CBC WITH DIFFERENTIAL/PLATELET
Abs Immature Granulocytes: 0.13 K/uL — ABNORMAL HIGH (ref 0.00–0.07)
Abs Immature Granulocytes: 0.23 K/uL — ABNORMAL HIGH (ref 0.00–0.07)
Basophils Absolute: 0 K/uL (ref 0.0–0.1)
Basophils Absolute: 0 K/uL (ref 0.0–0.1)
Basophils Relative: 0 %
Basophils Relative: 0 %
Eosinophils Absolute: 0 K/uL (ref 0.0–0.5)
Eosinophils Absolute: 0 K/uL (ref 0.0–0.5)
Eosinophils Relative: 0 %
Eosinophils Relative: 0 %
HCT: 24.7 % — ABNORMAL LOW (ref 39.0–52.0)
HCT: 23.3 % — ABNORMAL LOW (ref 39.0–52.0)
Hemoglobin: 8.9 g/dL — ABNORMAL LOW (ref 13.0–17.0)
Hemoglobin: 8.4 g/dL — ABNORMAL LOW (ref 13.0–17.0)
Immature Granulocytes: 1 %
Immature Granulocytes: 1 %
Lymphocytes Relative: 5 %
Lymphocytes Relative: 4 %
Lymphs Abs: 0.9 K/uL (ref 0.7–4.0)
Lymphs Abs: 0.9 K/uL (ref 0.7–4.0)
MCH: 28.6 pg (ref 26.0–34.0)
MCH: 28.9 pg (ref 26.0–34.0)
MCHC: 36 g/dL (ref 30.0–36.0)
MCHC: 36.1 g/dL — ABNORMAL HIGH (ref 30.0–36.0)
MCV: 79.4 fL — ABNORMAL LOW (ref 80.0–100.0)
MCV: 80.1 fL (ref 80.0–100.0)
Monocytes Absolute: 1.8 K/uL — ABNORMAL HIGH (ref 0.1–1.0)
Monocytes Absolute: 2.7 K/uL — ABNORMAL HIGH (ref 0.1–1.0)
Monocytes Relative: 9 %
Monocytes Relative: 11 %
Neutro Abs: 17.2 K/uL — ABNORMAL HIGH (ref 1.7–7.7)
Neutro Abs: 20 K/uL — ABNORMAL HIGH (ref 1.7–7.7)
Neutrophils Relative %: 85 %
Neutrophils Relative %: 84 %
Platelets: 183 K/uL (ref 150–400)
Platelets: 208 K/uL (ref 150–400)
RBC: 3.11 MIL/uL — ABNORMAL LOW (ref 4.22–5.81)
RBC: 2.91 MIL/uL — ABNORMAL LOW (ref 4.22–5.81)
RDW: 18.7 % — ABNORMAL HIGH (ref 11.5–15.5)
RDW: 19.4 % — ABNORMAL HIGH (ref 11.5–15.5)
WBC: 20.1 K/uL — ABNORMAL HIGH (ref 4.0–10.5)
WBC: 23.9 K/uL — ABNORMAL HIGH (ref 4.0–10.5)
nRBC: 0.1 % (ref 0.0–0.2)
nRBC: 0 % (ref 0.0–0.2)

## 2021-11-17 LAB — BPAM FFP
Blood Product Expiration Date: 202302062359
ISSUE DATE / TIME: 202302011820
Unit Type and Rh: 7300

## 2021-11-17 LAB — PREPARE FRESH FROZEN PLASMA: Unit division: 0

## 2021-11-17 LAB — GLUCOSE, CAPILLARY
Glucose-Capillary: 86 mg/dL (ref 70–99)
Glucose-Capillary: 92 mg/dL (ref 70–99)

## 2021-11-17 LAB — RENAL FUNCTION PANEL
Albumin: 2.1 g/dL — ABNORMAL LOW (ref 3.5–5.0)
Anion gap: 12 (ref 5–15)
BUN: 37 mg/dL — ABNORMAL HIGH (ref 6–20)
CO2: 24 mmol/L (ref 22–32)
Calcium: 7.9 mg/dL — ABNORMAL LOW (ref 8.9–10.3)
Chloride: 96 mmol/L — ABNORMAL LOW (ref 98–111)
Creatinine, Ser: 3.08 mg/dL — ABNORMAL HIGH (ref 0.61–1.24)
GFR, Estimated: 22 mL/min — ABNORMAL LOW (ref >60)
Glucose, Bld: 94 mg/dL (ref 70–99)
Phosphorus: 4.4 mg/dL (ref 2.5–4.6)
Potassium: 4.6 mmol/L (ref 3.5–5.1)
Sodium: 132 mmol/L — ABNORMAL LOW (ref 135–145)

## 2021-11-17 LAB — TROPONIN I (HIGH SENSITIVITY)
Troponin I (High Sensitivity): 52 ng/L — ABNORMAL HIGH (ref <18)
Troponin I (High Sensitivity): 60 ng/L — ABNORMAL HIGH (ref <18)

## 2021-11-17 LAB — HEPATITIS B SURFACE ANTIGEN: Hepatitis B Surface Ag: NONREACTIVE

## 2021-11-17 LAB — HEPATITIS B SURFACE ANTIBODY,QUALITATIVE: Hep B S Ab: REACTIVE — AB

## 2021-11-17 MED ORDER — DILTIAZEM HCL-DEXTROSE 125-5 MG/125ML-% IV SOLN (PREMIX)
5.0000 mg/h | INTRAVENOUS | Status: AC
  Administered 2021-11-17: 5 mg/h via INTRAVENOUS
  Filled 2021-11-17: qty 125

## 2021-11-17 MED ORDER — AMLODIPINE BESYLATE 5 MG PO TABS
5.0000 mg | ORAL_TABLET | Freq: Every day | ORAL | Status: DC
  Administered 2021-11-17 – 2021-11-19 (×3): 5 mg via ORAL
  Filled 2021-11-17 (×3): qty 1

## 2021-11-17 MED ORDER — HYDRALAZINE HCL 20 MG/ML IJ SOLN
10.0000 mg | INTRAMUSCULAR | Status: DC | PRN
  Administered 2021-11-18 – 2021-11-24 (×2): 10 mg via INTRAVENOUS
  Filled 2021-11-17 (×3): qty 1

## 2021-11-17 MED ORDER — OXYCODONE HCL 5 MG PO TABS
5.0000 mg | ORAL_TABLET | ORAL | Status: DC | PRN
  Administered 2021-11-17 – 2021-11-19 (×8): 10 mg via ORAL
  Administered 2021-11-19: 5 mg via ORAL
  Administered 2021-11-19 (×3): 10 mg via ORAL
  Administered 2021-11-20: 5 mg via ORAL
  Administered 2021-11-20: 10 mg via ORAL
  Filled 2021-11-17 (×14): qty 2

## 2021-11-17 MED ORDER — FENTANYL CITRATE (PF) 100 MCG/2ML IJ SOLN
12.5000 ug | INTRAMUSCULAR | Status: DC | PRN
Start: 2021-11-17 — End: 2021-11-18
  Administered 2021-11-17 – 2021-11-18 (×3): 12.5 ug via INTRAVENOUS
  Filled 2021-11-17 (×3): qty 2

## 2021-11-17 MED ORDER — HYDRALAZINE HCL 20 MG/ML IJ SOLN: 10.0000 mg | INTRAMUSCULAR | Status: DC | PRN

## 2021-11-17 NOTE — Progress Notes (Signed)
[facility] Progress Note  CC: Painless hematochezia/melena  Assessment / Plan: Recurrent GI bleeding occurring after GDA embolization for a very large duodenal bulb ulcer with overlying clot 11/05/21. Recurrent blood loss anemia patient had endoscopy yesterday with Dr. Lorenso Courier, colonoscopy was not pursued Nonobstructive spurting duodenal ulcer injected with epi, 3 clips placed, treated bipolar cautery and hemostatic spray at the end of the procedure.    Colonoscopy not pursued due to ongoing bleeding. HGB 8.9 MCV 79.4  WBC 20.1 Platelets 183 Hemoglobin currently stable-no signs of rebleeding can consider consult IR to see if can potentially target embolization  if IR is not able to embolize bleeding,  may need to be contacted contacted.Dema Severin blood cell count slightly elevated-monitor  He has significant comorbidities including  ESRD with associated anemia of chronic kidney disease HTN HFrEF with an EF 40-45% pericardial effusion on echo 12/22 history of colon cancer s/p lap colectomy 2014 history of SBO with LOA 2022 heroin abuse.    Subjective: Patient states he has not had a bowel movement since Sunday however in the chart notes soft stool at 2100 yesterday red blood clots that was large amount. Patient complaining of lower abdominal discomfort. Denies nausea or vomiting.  Objective:  Vital signs in last 24 hours: Temp:  [96.8 F (36 C)-98.5 F (36.9 C)] 98.5 F (36.9 C) (02/02 0744) Pulse Rate:  [89-135] 97 (02/02 1000) Resp:  [7-29] 18 (02/02 1000) BP: (133-189)/(56-136) 155/76 (02/02 1000) SpO2:  [88 %-100 %] 93 % (02/02 1000) Weight:  [71.7 kg-73.2 kg] 71.7 kg (02/02 0228) Last BM Date: 11/16/21 General:   Alert, chronically ill appearing, in NAD Heart:  Regular rate and rhythm; no murmurs Pulm: Clear anteriorly; no wheezing Abdomen:  Soft. Thin, slight lower AB tenderness. Nondistended. Normal bowel sounds. No rebound or  guarding. Extremities:  Without edema. Neurologic:  grossly normal neurologically. Alert, no tremor. Psych:  Alert , flat affect.    Lab Results: Recent Labs    11/15/21 0233 11/15/21 0805 11/16/21 1148 11/16/21 2216 11/16/21 2233 11/17/21 0604  WBC 11.5*  --  16.3*  --   --  20.1*  HGB 8.2*   < > 4.1* 8.8* 8.8* 8.9*  HCT 24.3*   < > 11.4* 25.1* 26.0* 24.7*  PLT 284  --  139*  --   --  183   < > = values in this interval not displayed.   BMET Recent Labs    11/15/21 0233 11/16/21 1148 11/16/21 2233 11/17/21 0603  NA 130* 131* 126* 132*  K 4.0 5.2* 6.6* 4.6  CL 95* 98 92* 96*  CO2 22 20*  --  24  GLUCOSE 101* 96 90 94  BUN 21* 47* 67* 37*  CREATININE 3.03* 3.92* 4.70* 3.08*  CALCIUM 7.9* 6.8*  --  7.9*   LFT Recent Labs    11/17/21 0603  ALBUMIN 2.1*   PT/INR Recent Labs    11/16/21 1148  LABPROT 21.1*  INR 1.8*     DG Abd 1 View  Result Date: 11/17/2021 CLINICAL DATA:  61 year old male status post mesenteric angiogram and Gel-Foam embolization for GI bleeding. EXAM: ABDOMEN - 1 VIEW COMPARISON:  CT Abdomen and Pelvis 11/15/2021 and earlier. FINDINGS: Portable AP supine view at 0538 hours. Embolization coils now at the gastroduodenal artery level. Multiple other abdominal surgical clips mostly on the right. Nonobstructed bowel-gas pattern. No definite pneumoperitoneum on this supine view. Trace retained oral contrast suspected at the gastric cardia. Pearline Cables  hazy opacity in the abdomen in keeping with ascites by CT. Negative lung bases. No acute osseous abnormality identified. IMPRESSION: 1. Nonobstructed bowel-gas pattern. 2. Ascites. 3. Recent gastroduodenal artery coil embolization. Electronically Signed   By: Genevie Ann M.D.   On: 11/17/2021 06:21   IR Angiogram Visceral Selective  Result Date: 11/16/2021 INDICATION: 61 year old male with history of recent gastrointestinal hemorrhage status post gastroduodenal artery coil embolization on 11/05/2021, currently  admitted for rebleeding episode with EGD demonstrating hemorrhage from the duodenum. Endoscopic intervention was unsuccessful. EXAM: 1. Ultrasound-guided vascular access of the right common femoral artery. 2. Selective catheterization angiography of the celiac artery and superior mesenteric artery. 3. Sub selective catheterization and angiography of anterior and posterior pancreaticoduodenal branches. 4. Gel-Foam embolization of anterior and posterior pancreaticoduodenal branches. MEDICATIONS: None. ANESTHESIA/SEDATION: Moderate (conscious) sedation was employed during this procedure. A total of Versed 1.5 mg and Fentanyl 75 mcg was administered intravenously. Moderate Sedation Time: 110 minutes. The patient's level of consciousness and vital signs were monitored continuously by radiology nursing throughout the procedure under my direct supervision. CONTRAST:  52mL OMNIPAQUE IOHEXOL 300 MG/ML SOLN, 68mL OMNIPAQUE IOHEXOL 300 MG/ML SOLN, 66mL OMNIPAQUE IOHEXOL 300 MG/ML SOLN FLUOROSCOPY: 34 minutes, 42 seconds. Radiation Exposure Index (as provided by the fluoroscopic device): 1,749 mGy Kerma COMPLICATIONS: None immediate. PROCEDURE: Informed consent was obtained from the patient following explanation of the procedure, risks, benefits and alternatives. The patient understands, agrees and consents for the procedure. All questions were addressed. A time out was performed prior to the initiation of the procedure. Maximal barrier sterile technique utilized including caps, mask, sterile gowns, sterile gloves, large sterile drape, hand hygiene, and Betadine prep. The right groin was prepped and draped in standard fashion. Preprocedure ultrasound demonstrated widely patent right common femoral artery. The procedure was planned. Subdermal Local anesthesia provided 1% lidocaine. A small skin nick was made. Under direct ultrasound visualization, the right common femoral is accessed with a 21 gauge micropuncture needle. A  permanent ultrasound image was captured and stored in the record. Micropuncture sheath was introduced and limited right lower extremity angiogram was performed which demonstrated adequate puncture site for closure device use. A J wire was directed to the micropuncture set to the abdominal aorta and a 5 French, 10 cm vascular sheath was placed. Five French C2 catheter was then advanced to the level of the celiac artery and celiac angiogram was performed. Angiogram demonstrated widely patent celiac and hepatic arteries with completely embolized gastroduodenal artery with indwelling coils. The catheter was then advanced to the level of the superior mesenteric artery. Angiogram was performed which demonstrated widely patent superior mesenteric artery and branch vessel supplying the duodenum without evidence of active extravasation. Selective catheterization of the pancreaticoduodenal arcade from the proximal super mesenteric artery was attempted with multiple catheter and wire combinations, however unsuccessful due to tortuosity. Therefore, the 5 [place] sheath was exchanged for a 7 [place], 55 cm Tourguide steerable sheath. The sheath was positioned near the ostium of the SMA through which a 5 [place], angled tip glide catheter was advanced and used to help CT the steerable sheath. Next, the anterior pancreaticoduodenal arcade was selected with a Progreat alpha microcatheter and fathom 14 microwire. Angiogram was performed which demonstrated multifocal irregularity of the vessel supplying the duodenum however no evidence of active, intraluminal extravasation. Given irregularity of the vessels, Gel-Foam slurry embolization was administered to near stasis. No evidence of reflux was observed. The microsystem was then retracted and repositioned within the posterior pancreaticoduodenal arcade. Sub  selective catheterization was performed angiogram demonstrated duodenal supply with irregularity of the distal branch vessels,  however no evidence of active intraluminal extravasation. Gel-Foam slurry embolization was also performed at this location. The microsystem was removed and repeat SMA angiogram was performed which again demonstrated no evidence of active extravasation about the duodenum however there was some vessel pruning observed. The catheter was removed. The sheath was exchanged for an 8 French Angio-Seal device which was deployed successfully without difficulty. Distal pulses were unchanged. The patient tolerated the procedure well was transferred back to the floor in stable condition. IMPRESSION: 1. No evidence of active extravasation about the duodenum, however multifocal vessel irregularity was observed. 2. Technically successful Gel-Foam slurry embolization of the anterior and posterior pancreaticoduodenal arcades. Ruthann Cancer, MD [facility] Radiology Electronically Signed   By: Ruthann Cancer M.D.   On: 11/16/2021 15:18   IR Angiogram Visceral Selective  Result Date: 11/16/2021 INDICATION: 61 year old male with history of recent gastrointestinal hemorrhage status post gastroduodenal artery coil embolization on 11/05/2021, currently admitted for rebleeding episode with EGD demonstrating hemorrhage from the duodenum. Endoscopic intervention was unsuccessful. EXAM: 1. Ultrasound-guided vascular access of the right common femoral artery. 2. Selective catheterization angiography of the celiac artery and superior mesenteric artery. 3. Sub selective catheterization and angiography of anterior and posterior pancreaticoduodenal branches. 4. Gel-Foam embolization of anterior and posterior pancreaticoduodenal branches. MEDICATIONS: None. ANESTHESIA/SEDATION: Moderate (conscious) sedation was employed during this procedure. A total of Versed 1.5 mg and Fentanyl 75 mcg was administered intravenously. Moderate Sedation Time: 110 minutes. The patient's level of consciousness and vital  signs were monitored continuously by radiology nursing throughout the procedure under my direct supervision. CONTRAST:  32mL OMNIPAQUE IOHEXOL 300 MG/ML SOLN, 17mL OMNIPAQUE IOHEXOL 300 MG/ML SOLN, 48mL OMNIPAQUE IOHEXOL 300 MG/ML SOLN FLUOROSCOPY: 34 minutes, 42 seconds. Radiation Exposure Index (as provided by the fluoroscopic device): 1,610 mGy Kerma COMPLICATIONS: None immediate. PROCEDURE: Informed consent was obtained from the patient following explanation of the procedure, risks, benefits and alternatives. The patient understands, agrees and consents for the procedure. All questions were addressed. A time out was performed prior to the initiation of the procedure. Maximal barrier sterile technique utilized including caps, mask, sterile gowns, sterile gloves, large sterile drape, hand hygiene, and Betadine prep. The right groin was prepped and draped in standard fashion. Preprocedure ultrasound demonstrated widely patent right common femoral artery. The procedure was planned. Subdermal Local anesthesia provided 1% lidocaine. A small skin nick was made. Under direct ultrasound visualization, the right common femoral is accessed with a 21 gauge micropuncture needle. A permanent ultrasound image was captured and stored in the record. Micropuncture sheath was introduced and limited right lower extremity angiogram was performed which demonstrated adequate puncture site for closure device use. A J wire was directed to the micropuncture set to the abdominal aorta and a 5 French, 10 cm vascular sheath was placed. Five French C2 catheter was then advanced to the level of the celiac artery and celiac angiogram was performed. Angiogram demonstrated widely patent celiac and hepatic arteries with completely embolized gastroduodenal artery with indwelling coils. The catheter was then advanced to the level of the superior mesenteric artery. Angiogram was performed which demonstrated widely patent superior mesenteric artery  and branch vessel supplying the duodenum without evidence of active extravasation. Selective catheterization of the pancreaticoduodenal arcade from the proximal super mesenteric artery was attempted with multiple catheter and wire combinations, however unsuccessful due to tortuosity. Therefore, the 5 [place] sheath was exchanged  for a 7 [place], 55 cm Tourguide steerable sheath. The sheath was positioned near the ostium of the SMA through which a 5 [place], angled tip glide catheter was advanced and used to help CT the steerable sheath. Next, the anterior pancreaticoduodenal arcade was selected with a Progreat alpha microcatheter and fathom 14 microwire. Angiogram was performed which demonstrated multifocal irregularity of the vessel supplying the duodenum however no evidence of active, intraluminal extravasation. Given irregularity of the vessels, Gel-Foam slurry embolization was administered to near stasis. No evidence of reflux was observed. The microsystem was then retracted and repositioned within the posterior pancreaticoduodenal arcade. Sub selective catheterization was performed angiogram demonstrated duodenal supply with irregularity of the distal branch vessels, however no evidence of active intraluminal extravasation. Gel-Foam slurry embolization was also performed at this location. The microsystem was removed and repeat SMA angiogram was performed which again demonstrated no evidence of active extravasation about the duodenum however there was some vessel pruning observed. The catheter was removed. The sheath was exchanged for an 8 French Angio-Seal device which was deployed successfully without difficulty. Distal pulses were unchanged. The patient tolerated the procedure well was transferred back to the floor in stable condition. IMPRESSION: 1. No evidence of active extravasation about the duodenum, however multifocal vessel irregularity was observed. 2. Technically successful Gel-Foam slurry embolization  of the anterior and posterior pancreaticoduodenal arcades. Ruthann Cancer, MD [facility] Radiology Electronically Signed   By: Ruthann Cancer M.D.   On: 11/16/2021 15:18   IR Angiogram Selective Each Additional Vessel  Result Date: 11/16/2021 INDICATION: 61 year old male with history of recent gastrointestinal hemorrhage status post gastroduodenal artery coil embolization on 11/05/2021, currently admitted for rebleeding episode with EGD demonstrating hemorrhage from the duodenum. Endoscopic intervention was unsuccessful. EXAM: 1. Ultrasound-guided vascular access of the right common femoral artery. 2. Selective catheterization angiography of the celiac artery and superior mesenteric artery. 3. Sub selective catheterization and angiography of anterior and posterior pancreaticoduodenal branches. 4. Gel-Foam embolization of anterior and posterior pancreaticoduodenal branches. MEDICATIONS: None. ANESTHESIA/SEDATION: Moderate (conscious) sedation was employed during this procedure. A total of Versed 1.5 mg and Fentanyl 75 mcg was administered intravenously. Moderate Sedation Time: 110 minutes. The patient's level of consciousness and vital signs were monitored continuously by radiology nursing throughout the procedure under my direct supervision. CONTRAST:  56mL OMNIPAQUE IOHEXOL 300 MG/ML SOLN, 25mL OMNIPAQUE IOHEXOL 300 MG/ML SOLN, 63mL OMNIPAQUE IOHEXOL 300 MG/ML SOLN FLUOROSCOPY: 34 minutes, 42 seconds. Radiation Exposure Index (as provided by the fluoroscopic device): 8,786 mGy Kerma COMPLICATIONS: None immediate. PROCEDURE: Informed consent was obtained from the patient following explanation of the procedure, risks, benefits and alternatives. The patient understands, agrees and consents for the procedure. All questions were addressed. A time out was performed prior to the initiation of the procedure. Maximal barrier sterile technique utilized including caps, mask,  sterile gowns, sterile gloves, large sterile drape, hand hygiene, and Betadine prep. The right groin was prepped and draped in standard fashion. Preprocedure ultrasound demonstrated widely patent right common femoral artery. The procedure was planned. Subdermal Local anesthesia provided 1% lidocaine. A small skin nick was made. Under direct ultrasound visualization, the right common femoral is accessed with a 21 gauge micropuncture needle. A permanent ultrasound image was captured and stored in the record. Micropuncture sheath was introduced and limited right lower extremity angiogram was performed which demonstrated adequate puncture site for closure device use. A J wire was directed to the micropuncture set to the abdominal aorta and a 5 [place],  10 cm vascular sheath was placed. Five French C2 catheter was then advanced to the level of the celiac artery and celiac angiogram was performed. Angiogram demonstrated widely patent celiac and hepatic arteries with completely embolized gastroduodenal artery with indwelling coils. The catheter was then advanced to the level of the superior mesenteric artery. Angiogram was performed which demonstrated widely patent superior mesenteric artery and branch vessel supplying the duodenum without evidence of active extravasation. Selective catheterization of the pancreaticoduodenal arcade from the proximal super mesenteric artery was attempted with multiple catheter and wire combinations, however unsuccessful due to tortuosity. Therefore, the 5 [place] sheath was exchanged for a 7 [place], 55 cm Tourguide steerable sheath. The sheath was positioned near the ostium of the SMA through which a 5 [place], angled tip glide catheter was advanced and used to help CT the steerable sheath. Next, the anterior pancreaticoduodenal arcade was selected with a Progreat alpha microcatheter and fathom 14 microwire. Angiogram was performed which demonstrated multifocal irregularity of the vessel  supplying the duodenum however no evidence of active, intraluminal extravasation. Given irregularity of the vessels, Gel-Foam slurry embolization was administered to near stasis. No evidence of reflux was observed. The microsystem was then retracted and repositioned within the posterior pancreaticoduodenal arcade. Sub selective catheterization was performed angiogram demonstrated duodenal supply with irregularity of the distal branch vessels, however no evidence of active intraluminal extravasation. Gel-Foam slurry embolization was also performed at this location. The microsystem was removed and repeat SMA angiogram was performed which again demonstrated no evidence of active extravasation about the duodenum however there was some vessel pruning observed. The catheter was removed. The sheath was exchanged for an 8 French Angio-Seal device which was deployed successfully without difficulty. Distal pulses were unchanged. The patient tolerated the procedure well was transferred back to the floor in stable condition. IMPRESSION: 1. No evidence of active extravasation about the duodenum, however multifocal vessel irregularity was observed. 2. Technically successful Gel-Foam slurry embolization of the anterior and posterior pancreaticoduodenal arcades. Ruthann Cancer, MD [facility] Radiology Electronically Signed   By: Ruthann Cancer M.D.   On: 11/16/2021 15:18   IR US Guide Vasc Access Right  Result Date: 11/16/2021 INDICATION: 61 year old male with history of recent gastrointestinal hemorrhage status post gastroduodenal artery coil embolization on 11/05/2021, currently admitted for rebleeding episode with EGD demonstrating hemorrhage from the duodenum. Endoscopic intervention was unsuccessful. EXAM: 1. Ultrasound-guided vascular access of the right common femoral artery. 2. Selective catheterization angiography of the celiac artery and superior mesenteric artery. 3. Sub  selective catheterization and angiography of anterior and posterior pancreaticoduodenal branches. 4. Gel-Foam embolization of anterior and posterior pancreaticoduodenal branches. MEDICATIONS: None. ANESTHESIA/SEDATION: Moderate (conscious) sedation was employed during this procedure. A total of Versed 1.5 mg and Fentanyl 75 mcg was administered intravenously. Moderate Sedation Time: 110 minutes. The patient's level of consciousness and vital signs were monitored continuously by radiology nursing throughout the procedure under my direct supervision. CONTRAST:  67mL OMNIPAQUE IOHEXOL 300 MG/ML SOLN, 59mL OMNIPAQUE IOHEXOL 300 MG/ML SOLN, 60mL OMNIPAQUE IOHEXOL 300 MG/ML SOLN FLUOROSCOPY: 34 minutes, 42 seconds. Radiation Exposure Index (as provided by the fluoroscopic device): 5,400 mGy Kerma COMPLICATIONS: None immediate. PROCEDURE: Informed consent was obtained from the patient following explanation of the procedure, risks, benefits and alternatives. The patient understands, agrees and consents for the procedure. All questions were addressed. A time out was performed prior to the initiation of the procedure. Maximal barrier sterile technique utilized including caps, mask, sterile gowns, sterile gloves, large  sterile drape, hand hygiene, and Betadine prep. The right groin was prepped and draped in standard fashion. Preprocedure ultrasound demonstrated widely patent right common femoral artery. The procedure was planned. Subdermal Local anesthesia provided 1% lidocaine. A small skin nick was made. Under direct ultrasound visualization, the right common femoral is accessed with a 21 gauge micropuncture needle. A permanent ultrasound image was captured and stored in the record. Micropuncture sheath was introduced and limited right lower extremity angiogram was performed which demonstrated adequate puncture site for closure device use. A J wire was directed to the micropuncture set to the abdominal aorta and a 5 French,  10 cm vascular sheath was placed. Five French C2 catheter was then advanced to the level of the celiac artery and celiac angiogram was performed. Angiogram demonstrated widely patent celiac and hepatic arteries with completely embolized gastroduodenal artery with indwelling coils. The catheter was then advanced to the level of the superior mesenteric artery. Angiogram was performed which demonstrated widely patent superior mesenteric artery and branch vessel supplying the duodenum without evidence of active extravasation. Selective catheterization of the pancreaticoduodenal arcade from the proximal super mesenteric artery was attempted with multiple catheter and wire combinations, however unsuccessful due to tortuosity. Therefore, the 5 [place] sheath was exchanged for a 7 [place], 55 cm Tourguide steerable sheath. The sheath was positioned near the ostium of the SMA through which a 5 [place], angled tip glide catheter was advanced and used to help CT the steerable sheath. Next, the anterior pancreaticoduodenal arcade was selected with a Progreat alpha microcatheter and fathom 14 microwire. Angiogram was performed which demonstrated multifocal irregularity of the vessel supplying the duodenum however no evidence of active, intraluminal extravasation. Given irregularity of the vessels, Gel-Foam slurry embolization was administered to near stasis. No evidence of reflux was observed. The microsystem was then retracted and repositioned within the posterior pancreaticoduodenal arcade. Sub selective catheterization was performed angiogram demonstrated duodenal supply with irregularity of the distal branch vessels, however no evidence of active intraluminal extravasation. Gel-Foam slurry embolization was also performed at this location. The microsystem was removed and repeat SMA angiogram was performed which again demonstrated no evidence of active extravasation about the duodenum however there was some vessel pruning  observed. The catheter was removed. The sheath was exchanged for an 8 French Angio-Seal device which was deployed successfully without difficulty. Distal pulses were unchanged. The patient tolerated the procedure well was transferred back to the floor in stable condition. IMPRESSION: 1. No evidence of active extravasation about the duodenum, however multifocal vessel irregularity was observed. 2. Technically successful Gel-Foam slurry embolization of the anterior and posterior pancreaticoduodenal arcades. Ruthann Cancer, MD [facility] Inter[facility] Radiology [facility] Radiology Electronically Signed   By: Ruthann Cancer M.D.   On: 11/16/2021 15:18   DG CHEST PORT 1 VIEW  Result Date: 11/17/2021 CLINICAL DATA:  Hypoxia EXAM: PORTABLE CHEST 1 VIEW COMPARISON:  Previous studies including the examination done on 11/13/2021 FINDINGS: Transverse diameter of heart is increased. Central pulmonary vessels are slightly less prominent. There are linear densities in the parahilar regions and medial right lower lung fields suggesting scarring or subsegmental atelectasis. There is interval improvement in aeration of parahilar regions. No new focal infiltrates are seen. There is no pleural effusion or pneumothorax. IMPRESSION: Cardiomegaly. There is decrease in pulmonary vascular congestion. There are no new focal pulmonary infiltrates. Electronically Signed   By: Elmer Picker M.D.   On: 11/17/2021 09:19   IR EMBO ART  VEN HEMORR LYMPH EXTRAV  INC  GUIDE ROADMAPPING  Result Date: 11/16/2021 INDICATION: 61 year old male with history of recent gastrointestinal hemorrhage status post gastroduodenal artery coil embolization on 11/05/2021, currently admitted for rebleeding episode with EGD demonstrating hemorrhage from the duodenum. Endoscopic intervention was unsuccessful. EXAM: 1. Ultrasound-guided vascular access of the right common femoral artery. 2. Selective catheterization angiography of the celiac  artery and superior mesenteric artery. 3. Sub selective catheterization and angiography of anterior and posterior pancreaticoduodenal branches. 4. Gel-Foam embolization of anterior and posterior pancreaticoduodenal branches. MEDICATIONS: None. ANESTHESIA/SEDATION: Moderate (conscious) sedation was employed during this procedure. A total of Versed 1.5 mg and Fentanyl 75 mcg was administered intravenously. Moderate Sedation Time: 110 minutes. The patient's level of consciousness and vital signs were monitored continuously by radiology nursing throughout the procedure under my direct supervision. CONTRAST:  60mL OMNIPAQUE IOHEXOL 300 MG/ML SOLN, 70mL OMNIPAQUE IOHEXOL 300 MG/ML SOLN, 65mL OMNIPAQUE IOHEXOL 300 MG/ML SOLN FLUOROSCOPY: 34 minutes, 42 seconds. Radiation Exposure Index (as provided by the fluoroscopic device): 0,973 mGy Kerma COMPLICATIONS: None immediate. PROCEDURE: Informed consent was obtained from the patient following explanation of the procedure, risks, benefits and alternatives. The patient understands, agrees and consents for the procedure. All questions were addressed. A time out was performed prior to the initiation of the procedure. Maximal barrier sterile technique utilized including caps, mask, sterile gowns, sterile gloves, large sterile drape, hand hygiene, and Betadine prep. The right groin was prepped and draped in standard fashion. Preprocedure ultrasound demonstrated widely patent right common femoral artery. The procedure was planned. Subdermal Local anesthesia provided 1% lidocaine. A small skin nick was made. Under direct ultrasound visualization, the right common femoral is accessed with a 21 gauge micropuncture needle. A permanent ultrasound image was captured and stored in the record. Micropuncture sheath was introduced and limited right lower extremity angiogram was performed which demonstrated adequate puncture site for closure device use. A J wire was directed to the  micropuncture set to the abdominal aorta and a 5 French, 10 cm vascular sheath was placed. Five French C2 catheter was then advanced to the level of the celiac artery and celiac angiogram was performed. Angiogram demonstrated widely patent celiac and hepatic arteries with completely embolized gastroduodenal artery with indwelling coils. The catheter was then advanced to the level of the superior mesenteric artery. Angiogram was performed which demonstrated widely patent superior mesenteric artery and branch vessel supplying the duodenum without evidence of active extravasation. Selective catheterization of the pancreaticoduodenal arcade from the proximal super mesenteric artery was attempted with multiple catheter and wire combinations, however unsuccessful due to tortuosity. Therefore, the 5 [place] sheath was exchanged for a 7 [place], 55 cm Tourguide steerable sheath. The sheath was positioned near the ostium of the SMA through which a 5 [place], angled tip glide catheter was advanced and used to help CT the steerable sheath. Next, the anterior pancreaticoduodenal arcade was selected with a Progreat alpha microcatheter and fathom 14 microwire. Angiogram was performed which demonstrated multifocal irregularity of the vessel supplying the duodenum however no evidence of active, intraluminal extravasation. Given irregularity of the vessels, Gel-Foam slurry embolization was administered to near stasis. No evidence of reflux was observed. The microsystem was then retracted and repositioned within the posterior pancreaticoduodenal arcade. Sub selective catheterization was performed angiogram demonstrated duodenal supply with irregularity of the distal branch vessels, however no evidence of active intraluminal extravasation. Gel-Foam slurry embolization was also performed at this location. The microsystem was removed and repeat SMA angiogram was performed which again demonstrated no evidence of active extravasation about  the duodenum however there was some vessel pruning observed. The catheter was removed. The sheath was exchanged for an 8 French Angio-Seal device which was deployed successfully without difficulty. Distal pulses were unchanged. The patient tolerated the procedure well was transferred back to the floor in stable condition. IMPRESSION: 1. No evidence of active extravasation about the duodenum, however multifocal vessel irregularity was observed. 2. Technically successful Gel-Foam slurry embolization of the anterior and posterior pancreaticoduodenal arcades. Ruthann Cancer, MD Vascular and Interventional Radiology Specialists [facility] Radiology Electronically Signed   By: Ruthann Cancer M.D.   On: 11/16/2021 15:18     LOS: 5 days   Vladimir Crofts  11/17/2021, 2:23 PM

## 2021-11-17 NOTE — Progress Notes (Addendum)
[facility] Progress Note Patient Name: Timothy [name] DOB: Jul 19, 1961 MRN: [id number]   Date of Service  11/17/2021  HPI/Events of Note  Abdominal pain - Review of abdominal film reveals: 1. Nonobstructed bowel-gas pattern. 2. Ascites. 3. Recent gastroduodenal artery coil embolization.  Troponin #1 still in process.    eICU Interventions  Continue present management.      Intervention Category Major Interventions: Other:  Lysle Dingwall 11/17/2021, 6:27 AM

## 2021-11-17 NOTE — Progress Notes (Signed)
Unsuccessful to obtain PIV x 2 attempted, pt. Refused for another try from IV Nurses, stated that he needed some rest. Rn aware and MD will be notified.

## 2021-11-17 NOTE — Progress Notes (Signed)
NAME:  Timothy [name], MRN:  [id number], DOB:  August 08, 1961, LOS: 5 ADMISSION DATE:  11/12/2021 CONSULTATION DATE:  11/15/2021 REFERRING MD:  [place] Crane - [facility] CHIEF COMPLAINT:  GIB, hypotension   History of Present Illness:  61 year old man who presented to [facility] ED 1/28 via EMS after being found down on the bathroom floor with rectal bleeding.  EMS was called and patient was transferred to [facility] ED. Found to have GI bleed with associated symptomatic anemia (Hgb 5.1).  Transfused 3 units PRBCs.  EGD 1/30 demonstrated duodenal ulcer with overlying adherent clot but no signs of active bleeding.  On 1/31, patient had large volume melena with clots and associated Hgb drop to 6.6.  He became relatively hypotensive with SBPs 80s and mild tachycardia to low 100s.  Received additional 2 units PRBCs.  PCCM was consulted for possible ICU admission/pressor initiation.  Pertinent Medical History:   Past Medical History:  Diagnosis Date   Anemia of chronic kidney failure    BPH (benign prostatic hyperplasia)    Colon cancer [facility]) 2014   spouse states he had surgury for colon CA   End stage renal disease on dialysis ([place]) 2017   Hypertension    Hypertensive heart disease with chronic diastolic congestive heart failure ([place]) 07/17/2016   Polysubstance abuse ([place])    History of heroin and marijuana use   Significant Hospital Events: Including procedures, antibiotic start and stop dates in addition to other pertinent events   1/28 - Presented to [facility] via EMS for rectal bleeding, found down at home. Admitted to [facility]. 1/30 - EGD demonstrated duodenal ulcer with overlying adherent clot but no active bleeding. GI following. 1/31 - Large volume melena with clots, mild tachycardia and hypotension with SBPs 80s. Hgb 6.6, 2U PRBCs ordered. PCCM consulted for possible ICU admission. 2/1 - EGD with spurting duodenal ulcer. IR consulted. Patient underwent embolization of the anterior and posterior  pancreatico-duodenal branches.  2/2 - A. Fib with RVR. Self-converted to sinus tachycardia   Interim History / Subjective:   Overnight, patient was noted to go into A.fib with RVR. Diltiazem gtt started. Several hours later, patient self-converted to sinus tachycardia. He was dialyzed overnight with net UF 1.3L.No additional episodes of melena or hematochezia.   At this time, he states his abdominal pain is improving. He denies any chest pain or SOB.    Objective:  Blood pressure (!) 154/86, pulse (!) 121, temperature 98 F (36.7 C), temperature source Oral, resp. rate (!) 25, height 6\' 1"  (1.854 m), weight 71.7 kg, SpO2 92 %.        Intake/Output Summary (Last 24 hours) at 11/17/2021 0730 Last data filed at 11/17/2021 0225 Gross per 24 hour  Intake 1712.97 ml  Output 1769 ml  Net -56.03 ml    Filed Weights   11/16/21 2000 11/16/21 2310 11/17/21 0228  Weight: 73.2 kg 73.2 kg 71.7 kg   Physical Examination: General: chronically ill man, no distress HEENT: dry mucous membranes Neuro: alert and oriented. Moves all extremities CV: regular rhythm with tachycardia, 2/6 systolic murmur.  PULM: Mild rhonchi bilaterally up to mid lung fields. No increased work of breathing.   GI: non-distended, non-tender, + BS Extremities: no edema Skin: no rash  Resolved Hospital Problem List:  Hypotension  Assessment & Plan:   Upper GIB 2/2 Duodenal Ulcer Presented to [facility] ED via EMS 1/28 for rectal bleeding. Symptomatic anemia noted with Hgb 5.1 EGD completed with GI 1/30 demonstrating duodenal ulcer with adherent clot,  but no active bleeding. Repeat EGD on 2/1 demonstrating duodenal ulcer with spurting blood. Clip unable to be placed. IR was consulted with subsequent Gelfoam embolization of the anterior and posterior pancreatico-duodenal branches.   - Appreciate GI management - All AC on hold - Continue Protonix 40 mg IV BID - If bleeding should reoccur, will likely need general surgery  consult.  ABLA Noted in the setting of active GIB. S/p 3U PRBCs 1/28, 3U PRBCs 1/31, 3U PRBCs on 2/1. Patient also required Vitamin K and FFP.   -CBC BID  -Transfuse for Hgb 8.0 or hemodynamically significant bleeding  A. Fib with RVR  PVCs Transient Ventricular Bigeminy  New diagnosis in the setting of acute illness. Holding off on anticoagulation given acute blood loss yesterday. Will need to consider in the future given CHADs-VASc is 2. On Dilt for rate control. Telemetry demonstrating conversion to sinus tachycardia.   - Wean off Diltiazem gtt  - Continue telemetry monitoring   Leukocytosis  Suspect reactive in the setting of GIB. CXR without opacities. Low suspicion for infection at this time.   Hypertension  Only home medication listed is Carvedilol. Often experiences dialysis associated hypotension. Due to this, may need to have a higher BP goal for now.   - Hold home Coreg - Start Amlodipine 5 mg daily - Hydralazine 10 mg IV every 4 hours PRN for SBP > 170 or DBP > 90  ESRD on HD - Appreciate HD management - HD as per Nephro recs.   Chronic Pain Syndrome - Continue home Oxycodone 10 mg BID PRN  Best Practice: (right click and "Reselect all SmartList Selections" daily)   Diet/type: CLD DVT prophylaxis: SCD GI prophylaxis: PPI Lines: N/A Foley:  N/A Code Status:  full code Last date of multidisciplinary goals of care discussion: N/A  Labs:  CBC: Recent Labs  Lab 11/13/21 0113 11/13/21 0645 11/14/21 1015 11/15/21 0233 11/15/21 0805 11/16/21 0128 11/16/21 1148 11/16/21 2216 11/16/21 2233 11/17/21 0604  WBC 10.5  --  10.8* 11.5*  --   --  16.3*  --   --  20.1*  NEUTROABS  --   --   --   --   --   --  14.2*  --   --  17.2*  HGB 6.1*   < > 7.8* 8.2*   < > 8.2* 4.1* 8.8* 8.8* 8.9*  HCT 18.3*   < > 23.7* 24.3*   < > 23.1* 11.4* 25.1* 26.0* 24.7*  MCV 85.5  --  86.5 85.3  --   --  83.8  --   --  79.4*  PLT 232  --  262 284  --   --  139*  --   --  183   <  > = values in this interval not displayed.    Basic Metabolic Panel: Recent Labs  Lab 11/13/21 0113 11/14/21 0745 11/14/21 1015 11/15/21 0233 11/16/21 1148 11/16/21 2233 11/17/21 0603  NA 132*   < > 131* 130* 131* 126* 132*  K 4.2   < > 3.5 4.0 5.2* 6.6* 4.6  CL 92*   < > 94* 95* 98 92* 96*  CO2 25  --  27 22 20*  --  24  GLUCOSE 83   < > 80 101* 96 90 94  BUN 53*   < > 35* 21* 47* 67* 37*  CREATININE 5.02*   < > 3.91* 3.03* 3.92* 4.70* 3.08*  CALCIUM 7.7*  --  7.7* 7.9* 6.8*  --  7.9*  PHOS  --   --  3.6 3.3 5.8*  --  4.4   < > = values in this interval not displayed.    GFR: Estimated Creatinine Clearance: 25.9 mL/min (A) (by C-G formula based on SCr of 3.08 mg/dL (H)). Recent Labs  Lab 11/14/21 1015 11/15/21 0233 11/16/21 1148 11/17/21 0604  WBC 10.8* 11.5* 16.3* 20.1*    Liver Function Tests: Recent Labs  Lab 11/12/21 1501 11/14/21 1015 11/15/21 0233 11/16/21 1148 11/17/21 0603  AST 22  --   --   --   --   ALT 27  --   --   --   --   ALKPHOS 55  --   --   --   --   BILITOT 0.5  --   --   --   --   PROT 5.6*  --   --   --   --   ALBUMIN 2.0* 2.1* 2.1* 1.7* 2.1*    No results for input(s): LIPASE, AMYLASE in the last 168 hours. No results for input(s): AMMONIA in the last 168 hours.  ABG:    Component Value Date/Time   PHART 7.394 11/05/2021 1043   PCO2ART 44.4 11/05/2021 1043   PO2ART 82 (L) 11/05/2021 1043   HCO3 27.1 11/05/2021 1043   TCO2 24 11/16/2021 2233   ACIDBASEDEF 4.0 (H) 09/09/2016 0931   O2SAT 96.0 11/05/2021 1043     Coagulation Profile: Recent Labs  Lab 11/13/21 0113 11/16/21 1148  INR 1.5* 1.8*    Cardiac Enzymes: No results for input(s): CKTOTAL, CKMB, CKMBINDEX, TROPONINI in the last 168 hours.  HbA1C: No results found for: HGBA1C  CBG: Recent Labs  Lab 11/15/21 1531 11/15/21 1905 11/16/21 1921 11/16/21 2344 11/17/21 0347  GLUCAP 98 89 94 86 86     Dr. Jose Persia Internal Medicine PGY-3  11/17/2021,  9:12 AM

## 2021-11-17 NOTE — Progress Notes (Addendum)
[facility] Progress Note Patient Name: Jes Costales DOB: 1961-09-16 MRN: [id number]   Date of Service  11/17/2021  HPI/Events of Note  AFIB with RVR - Ventricular rate = 141. EKG reveals atrial fibrillation with rapid ventricular response and nonspecific T wave abnormality.  BP = 153/79. Will avoid anticoagulation with Heparin IV infusion d/t   eICU Interventions  Plan: Cardizem IV infusion. Titrate to HR = 65-105.  Cycle Troponin,     Intervention Category Major Interventions: Arrhythmia - evaluation and management  Phillips Goulette Eugene 11/17/2021, 4:39 AM

## 2021-11-17 NOTE — Progress Notes (Signed)
eLink Physician-Brief Progress Note Patient Name: Timothy [name] DOB: 03-30-1961 MRN: [id number]   Date of Service  11/17/2021  HPI/Events of Note  Patient has a history of IVDA and the vascular team stuck him several times unsuccessfully in an  attempt to insert  a PIV, now he is adamant that he has to have a 1.5 hour break before any new attempts, he has ERSD precluding a PICC, and he has no strong indications for a central line beyond his being in the ICU, I tried to persuade him to have another attempt right away because of the risk to him if unforseen instability occurs, even with the knowledge that it could lead to death he is adamant that there be a 1.5 hour break before any other attempt.  eICU Interventions  Vascular team will try again in 1.5 hours.        Kerry Kass Tnya Ades 11/17/2021, 10:14 PM

## 2021-11-17 NOTE — Progress Notes (Signed)
Patient ID: Timothy [name], male   DOB: [birth year], 61 y.o.   MRN: [id number] [facility] Progress Note   Assessment/ Plan:   1.  Recurrent GI bleeding: Underwent EGD yesterday that showed active bleeding from duodenal ulcer that could not be controlled definitively by clipping or bipolar cautery and thereafter sent to IR for embolization after temporizing measures with hemospray.  He later yesterday had mesenteric angiogram with Gelfoam embolization of anterior and posterior pancreaticoduodenal arcade (this after having undergone greater duodenal artery embolization on 11/05/2021).  Developed acute blood loss anemia yesterday prompting administration of multiple blood products with concern raised overnight for abdominal pain, hypertension and some worsening shortness of breath. 2. ESRD: On hemodialysis on a TTS schedule and has been off schedule based on labs/ultrafiltration requirements and need for GI procedures.  He underwent hemodialysis overnight after missing dialysis on Tuesday-urgency because of volume status/hyperkalemia.  Will reevaluate ability to dialyze him again today if possible in the afternoon to get him back onto outpatient schedule otherwise this will be deferred until tomorrow. 3. Anemia: This is secondary to underlying end-stage renal disease/chronic illness and compounded by acute blood loss anemia from GI bleed.  Blood transfusion/FFP administered yesterday for management of acute blood loss anemia/GI bleed. 4. CKD-MBD: Elevated phosphorus level noted, will follow closely and resume binders when graduates to advance diet. 5. Nutrition: On clear liquids at this time due to high risk for rebleeding 6. Hypertension: Blood pressures elevated this morning with reasonable caution for lowering especially if GI bleed recurs.  Attempt hemodialysis later again today.  Subjective:   Reports to be feeling fair-denies any shortness of breath and states that abdominal pain is  tolerable   Objective:   BP (!) 154/86    Pulse (!) 121    Temp 98 F (36.7 C) (Oral)    Resp (!) 25    Ht 6\' 1"  (1.854 m)    Wt 71.7 kg    SpO2 92%    BMI 20.85 kg/m   Physical Exam: Gen: Appears fatigued, resting on his side in bed. CVS: Pulse regular tachycardia, S1 and S2 normal Resp: Coarse breath sounds bilaterally, no distinct rales or rhonchi Abd: Soft, obese, nontender, bowel sounds normal Ext: No lower extremity edema, left upper arm AV fistula with palpable thrill  Labs: BMET Recent Labs  Lab 11/12/21 1501 11/12/21 1526 11/13/21 0113 11/14/21 0745 11/14/21 1015 11/15/21 0233 11/16/21 1148 11/16/21 2233 11/17/21 0603  NA 138   < > 132* 133* 131* 130* 131* 126* 132*  K 4.1   < > 4.2 4.1 3.5 4.0 5.2* 6.6* 4.6  CL 98   < > 92* 94* 94* 95* 98 92* 96*  CO2 27  --  25  --  27 22 20*  --  24  GLUCOSE 130*   < > 83 67* 80 101* 96 90 94  BUN 41*   < > 53* 34* 35* 21* 47* 67* 37*  CREATININE 4.68*   < > 5.02* 3.90* 3.91* 3.03* 3.92* 4.70* 3.08*  CALCIUM 7.7*  --  7.7*  --  7.7* 7.9* 6.8*  --  7.9*  PHOS  --   --   --   --  3.6 3.3 5.8*  --  4.4   < > = values in this interval not displayed.   CBC Recent Labs  Lab 11/14/21 1015 11/15/21 0233 11/15/21 0805 11/16/21 1148 11/16/21 2216 11/16/21 2233 11/17/21 0604  WBC 10.8* 11.5*  --  16.3*  --   --  20.1*  NEUTROABS  --   --   --  14.2*  --   --  17.2*  HGB 7.8* 8.2*   < > 4.1* 8.8* 8.8* 8.9*  HCT 23.7* 24.3*   < > 11.4* 25.1* 26.0* 24.7*  MCV 86.5 85.3  --  83.8  --   --  79.4*  PLT 262 284  --  139*  --   --  183   < > = values in this interval not displayed.     Medications:     sodium chloride   Intravenous Once   Chlorhexidine Gluconate Cloth  6 each Topical Q0600   doxercalciferol  2 mcg Intravenous Q T,Th,Sa-HD   pantoprazole (PROTONIX) IV  40 mg Intravenous Q12H   sevelamer carbonate  1,600 mg Oral TID with meals   Elmarie Shiley, MD 11/17/2021, 7:43 AM

## 2021-11-17 NOTE — Progress Notes (Addendum)
[facility] Progress Note Patient Name: Timothy [name] DOB: [birth year] MRN: [id number]   Date of Service  11/17/2021  HPI/Events of Note  Hypertension - BP = 168/94. Patient was c/o abdominal pain. However, now says abdominal pain is improved.   eICU Interventions  Plan: Hydralazine 10 mg IV Q 4 hours PRN SBP > 170 or DBP > 100.     Intervention Category Major Interventions: Hypertension - evaluation and management  Timothy [name] 11/17/2021, 4:11 AM

## 2021-11-17 NOTE — Progress Notes (Signed)
[place] Progress Note Patient Name: Timothy [name] DOB: Oct 25, 1960 MRN: [id number]   Date of Service  11/17/2021  HPI/Events of Note  Patient with sub-optimal pain control on his current regimen. He has a history of IVDA.  eICU Interventions  Oxycodone dosing regimen modified.        Kerry Kass Vickie Ponds 11/17/2021, 8:30 PM

## 2021-11-18 DIAGNOSIS — R578 Other shock: Secondary | ICD-10-CM | POA: Diagnosis not present

## 2021-11-18 DIAGNOSIS — K264 Chronic or unspecified duodenal ulcer with hemorrhage: Principal | ICD-10-CM

## 2021-11-18 DIAGNOSIS — K922 Gastrointestinal hemorrhage, unspecified: Secondary | ICD-10-CM | POA: Diagnosis not present

## 2021-11-18 LAB — CBC WITH DIFFERENTIAL/PLATELET
Abs Immature Granulocytes: 0 10*3/uL (ref 0.00–0.07)
Abs Immature Granulocytes: 0.27 10*3/uL — ABNORMAL HIGH (ref 0.00–0.07)
Basophils Absolute: 0 10*3/uL (ref 0.0–0.1)
Basophils Absolute: 0 10*3/uL (ref 0.0–0.1)
Basophils Relative: 0 %
Basophils Relative: 0 %
Eosinophils Absolute: 0 10*3/uL (ref 0.0–0.5)
Eosinophils Absolute: 0.1 10*3/uL (ref 0.0–0.5)
Eosinophils Relative: 0 %
Eosinophils Relative: 0 %
HCT: 21.6 % — ABNORMAL LOW (ref 39.0–52.0)
HCT: 24.2 % — ABNORMAL LOW (ref 39.0–52.0)
Hemoglobin: 7.4 g/dL — ABNORMAL LOW (ref 13.0–17.0)
Hemoglobin: 8.1 g/dL — ABNORMAL LOW (ref 13.0–17.0)
Immature Granulocytes: 1 %
Lymphocytes Relative: 1 %
Lymphocytes Relative: 4 %
Lymphs Abs: 0.3 10*3/uL — ABNORMAL LOW (ref 0.7–4.0)
Lymphs Abs: 1 10*3/uL (ref 0.7–4.0)
MCH: 28.2 pg (ref 26.0–34.0)
MCH: 28.2 pg (ref 26.0–34.0)
MCHC: 33.5 g/dL (ref 30.0–36.0)
MCHC: 34.3 g/dL (ref 30.0–36.0)
MCV: 82.4 fL (ref 80.0–100.0)
MCV: 84.3 fL (ref 80.0–100.0)
Monocytes Absolute: 1 10*3/uL (ref 0.1–1.0)
Monocytes Absolute: 2.5 10*3/uL — ABNORMAL HIGH (ref 0.1–1.0)
Monocytes Relative: 10 %
Monocytes Relative: 4 %
Neutro Abs: 20.6 10*3/uL — ABNORMAL HIGH (ref 1.7–7.7)
Neutro Abs: 23.9 10*3/uL — ABNORMAL HIGH (ref 1.7–7.7)
Neutrophils Relative %: 85 %
Neutrophils Relative %: 95 %
Platelets: 229 10*3/uL (ref 150–400)
Platelets: 236 10*3/uL (ref 150–400)
RBC: 2.62 MIL/uL — ABNORMAL LOW (ref 4.22–5.81)
RBC: 2.87 MIL/uL — ABNORMAL LOW (ref 4.22–5.81)
RDW: 20.2 % — ABNORMAL HIGH (ref 11.5–15.5)
RDW: 20.3 % — ABNORMAL HIGH (ref 11.5–15.5)
WBC: 24.6 10*3/uL — ABNORMAL HIGH (ref 4.0–10.5)
WBC: 25.2 10*3/uL — ABNORMAL HIGH (ref 4.0–10.5)
nRBC: 0 % (ref 0.0–0.2)
nRBC: 0 % (ref 0.0–0.2)
nRBC: 0 /100 WBC

## 2021-11-18 LAB — RENAL FUNCTION PANEL
Albumin: 1.9 g/dL — ABNORMAL LOW (ref 3.5–5.0)
Anion gap: 13 (ref 5–15)
BUN: 49 mg/dL — ABNORMAL HIGH (ref 6–20)
CO2: 23 mmol/L (ref 22–32)
Calcium: 7.6 mg/dL — ABNORMAL LOW (ref 8.9–10.3)
Chloride: 94 mmol/L — ABNORMAL LOW (ref 98–111)
Creatinine, Ser: 4.1 mg/dL — ABNORMAL HIGH (ref 0.61–1.24)
GFR, Estimated: 16 mL/min — ABNORMAL LOW (ref >60)
Glucose, Bld: 92 mg/dL (ref 70–99)
Phosphorus: 5.2 mg/dL — ABNORMAL HIGH (ref 2.5–4.6)
Potassium: 5.1 mmol/L (ref 3.5–5.1)
Sodium: 130 mmol/L — ABNORMAL LOW (ref 135–145)

## 2021-11-18 LAB — HEPATITIS B SURFACE ANTIBODY, QUANTITATIVE: Hep B S AB Quant (Post): 22.2 m[IU]/mL (ref >9.9)

## 2021-11-18 MED ORDER — FENTANYL CITRATE PF 50 MCG/ML IJ SOSY
12.5000 ug | PREFILLED_SYRINGE | INTRAMUSCULAR | Status: DC | PRN
  Administered 2021-11-18 – 2021-11-20 (×2): 12.5 ug via INTRAVENOUS
  Filled 2021-11-18 (×3): qty 1

## 2021-11-18 MED ORDER — PANTOPRAZOLE SODIUM 40 MG PO TBEC
40.0000 mg | DELAYED_RELEASE_TABLET | Freq: Two times a day (BID) | ORAL | Status: DC
  Administered 2021-11-18 – 2021-11-19 (×4): 40 mg via ORAL
  Filled 2021-11-18 (×4): qty 1

## 2021-11-18 NOTE — Procedures (Signed)
Patient seen on Hemodialysis. BP (!) 178/84    Pulse (!) 104    Temp 98 F (36.7 C) (Oral)    Resp 16    Ht 6\' 1"  (1.854 m)    Wt 72.3 kg    SpO2 94%    BMI 21.03 kg/m   QB 400, UF goal 2L Tolerating treatment without complaints at this time.   Elmarie Shiley MD [facility]. Office # [phone] Pager # [phone] 8:39 AM

## 2021-11-18 NOTE — Progress Notes (Signed)
Patient ID: Timothy [name], male   DOB: Feb 15, 1961, 61 y.o.   MRN: [id number] [name] Progress Note   Assessment/ Plan:   1.  Recurrent GI bleeding: Underwent EGD on 2/1 that showed active bleeding from duodenal ulcer that could not be controlled definitively by clipping or bipolar cautery and thereafter sent to IR for embolization after temporizing measures with hemospray.  He later had mesenteric angiogram with Gelfoam embolization of anterior and posterior pancreaticoduodenal arcade (this after having undergone greater duodenal artery embolization on 11/05/2021).  Hemoglobin and hematocrit trending down slowly with melena overnight.  Hemodynamically stable and without indication for PRBC transfusion. 2. ESRD: On hemodialysis on a TTS schedule and has been off schedule based on labs/ultrafiltration requirements and need for GI procedures.  He is ongoing hemodialysis at this time after inability to get it yesterday 3. Anemia: This is secondary to underlying end-stage renal disease/chronic illness and compounded by acute blood loss anemia from GI bleed.  Blood transfusion/FFP administered yesterday for management of acute blood loss anemia/GI bleed. 4. CKD-MBD: Elevated phosphorus level noted, will follow closely and resume binders when graduates to advance diet. 5. Nutrition: On clear liquids at this time due to high risk for rebleeding 6. Hypertension: Blood pressures elevated this morning with reasonable caution for lowering especially if GI bleed recurs.  Subjective:   Feeling tired this morning after poor sleep overnight, getting hemodialysis this morning.   Objective:   BP 103/83    Pulse 99    Temp 98 F (36.7 C) (Oral)    Resp 16    Ht 6\' 1"  (1.854 m)    Wt 72.3 kg    SpO2 98%    BMI 21.03 kg/m   Physical Exam: Gen: Appears fatigued, getting hemodialysis CVS: Pulse regular tachycardia, S1 and S2 normal Resp: Coarse breath sounds bilaterally, no distinct rales or  rhonchi Abd: Soft, obese, nontender, bowel sounds normal Ext: No lower extremity edema, left upper arm AV fistula cannulated for dialysis  Labs: BMET Recent Labs  Lab 11/12/21 1501 11/12/21 1526 11/13/21 0113 11/14/21 0745 11/14/21 1015 11/15/21 0233 11/16/21 1148 11/16/21 2233 11/17/21 0603 11/18/21 0151  NA 138   < > 132* 133* 131* 130* 131* 126* 132* 130*  K 4.1   < > 4.2 4.1 3.5 4.0 5.2* 6.6* 4.6 5.1  CL 98   < > 92* 94* 94* 95* 98 92* 96* 94*  CO2 27  --  25  --  27 22 20*  --  24 23  GLUCOSE 130*   < > 83 67* 80 101* 96 90 94 92  BUN 41*   < > 53* 34* 35* 21* 47* 67* 37* 49*  CREATININE 4.68*   < > 5.02* 3.90* 3.91* 3.03* 3.92* 4.70* 3.08* 4.10*  CALCIUM 7.7*  --  7.7*  --  7.7* 7.9* 6.8*  --  7.9* 7.6*  PHOS  --   --   --   --  3.6 3.3 5.8*  --  4.4 5.2*   < > = values in this interval not displayed.   CBC Recent Labs  Lab 11/16/21 1148 11/16/21 2216 11/16/21 2233 11/17/21 0604 11/17/21 1629 11/18/21 0030  WBC 16.3*  --   --  20.1* 23.9* 24.6*  NEUTROABS 14.2*  --   --  17.2* 20.0* 20.6*  HGB 4.1*   < > 8.8* 8.9* 8.4* 7.4*  HCT 11.4*   < > 26.0* 24.7* 23.3* 21.6*  MCV 83.8  --   --  79.4* 80.1 82.4  PLT 139*  --   --  183 208 236   < > = values in this interval not displayed.     Medications:     sodium chloride   Intravenous Once   amLODipine  5 mg Oral Daily   Chlorhexidine Gluconate Cloth  6 each Topical Q0600   doxercalciferol  2 mcg Intravenous Q T,Th,Sa-HD   pantoprazole (PROTONIX) IV  40 mg Intravenous Q12H   sevelamer carbonate  1,600 mg Oral TID with meals   Elmarie Shiley, MD 11/18/2021, 8:32 AM

## 2021-11-18 NOTE — Progress Notes (Signed)
Pt arrived to unit

## 2021-11-18 NOTE — Progress Notes (Addendum)
NAME:  Timothy [name], MRN:  [id number], DOB:  20-Jul-1961, LOS: 6 ADMISSION DATE:  11/12/2021 CONSULTATION DATE:  11/15/2021 REFERRING MD:  Timothy [name] - [facility] CHIEF COMPLAINT:  GIB, hypotension   History of Present Illness:  61 year old man who presented to [facility] ED 1/28 via EMS after being found down on the bathroom floor with rectal bleeding.  EMS was called and patient was transferred to [facility] ED. Found to have GI bleed with associated symptomatic anemia (Hgb 5.1).  Transfused 3 units PRBCs.  EGD 1/30 demonstrated duodenal ulcer with overlying adherent clot but no signs of active bleeding.  On 1/31, patient had large volume melena with clots and associated Hgb drop to 6.6.  He became relatively hypotensive with SBPs 80s and mild tachycardia to low 100s.  Received additional 2 units PRBCs.  PCCM was consulted for possible ICU admission/pressor initiation.  Pertinent Medical History:   Past Medical History:  Diagnosis Date   Anemia of chronic kidney failure    BPH (benign prostatic hyperplasia)    Colon cancer [facility]) 2014   spouse states he had surgury for colon CA   End stage renal disease on dialysis ([place]) 2017   Hypertension    Hypertensive heart disease with chronic diastolic congestive heart failure ([place]) 07/17/2016   Polysubstance abuse ([place])    History of heroin and marijuana use   Significant Hospital Events: Including procedures, antibiotic start and stop dates in addition to other pertinent events   1/28 - Presented to [facility] via EMS for rectal bleeding, found down at home. Admitted to [facility]. 1/30 - EGD demonstrated duodenal ulcer with overlying adherent clot but no active bleeding. GI following. 1/31 - Large volume melena with clots, mild tachycardia and hypotension with SBPs 80s. Hgb 6.6, 2U PRBCs ordered. PCCM consulted for possible ICU admission. 2/1 - EGD with spurting duodenal ulcer. IR consulted. Patient underwent embolization of the anterior and posterior  pancreatico-duodenal branches.  2/2 - A. Fib with RVR. Self-converted to sinus tachycardia   Interim History / Subjective:   No significant overnight events, other than loss of PIV and patient refusing new placement after unsuccessful attempts x2. He did not receive dialysis yesterday as initially planned. Remains hypertensive.   Timothy [name] endorses continued generalized abdominal pain today but notes it is improved compared to yesterday. He denies any bowel movements over the past 24 hours. He denies any chest pain, SOB or palpitations. He is being dialyzed at the moment.   Objective:  Blood pressure (!) 179/90, pulse 99, temperature 98 F (36.7 C), temperature source Oral, resp. rate 16, height 6\' 1"  (1.854 m), weight 72.3 kg, SpO2 98 %.        Intake/Output Summary (Last 24 hours) at 11/18/2021 0744 Last data filed at 11/18/2021 0600 Gross per 24 hour  Intake 1482.46 ml  Output 0 ml  Net 1482.46 ml    Filed Weights   11/17/21 0228 11/18/21 0500 11/18/21 7616  Weight: 71.7 kg 72.2 kg 72.3 kg   Physical Examination: General: chronically ill man, no distress HEENT: Moist mucous membranes Neuro: Alert and oriented x3. Moves all extremities CV: Regular rhythm with mild tachycardia, 2/6 systolic murmur.  PULM: Mild rhonchi bilaterally up to mid lung fields. No increased work of breathing.   GI: Non-distended, minimal TTP, hypoactive BS Extremities: no edema Skin: no rash  Resolved Hospital Problem List:  Hypotension  Assessment & Plan:   Upper GIB 2/2 Duodenal Ulcer Presented to [facility] ED via EMS 1/28 for rectal  bleeding. Symptomatic anemia noted with Hgb 5.1 EGD completed with GI 1/30 demonstrating duodenal ulcer with adherent clot, but no active bleeding. Repeat EGD on 2/1 demonstrating duodenal ulcer with spurting blood. Clip unable to be placed. IR was consulted with subsequent Gelfoam embolization of the anterior and posterior pancreatico-duodenal branches. No additional melena  in at least 24 hours.   - Appreciate GI management - All AC on hold - Continue Protonix 40 mg BID. Transition to oral today. Will need 8 weeks total.   - If bleeding should reoccur, will likely need general surgery consult.  ABLA Noted in the setting of active GIB. S/p 3U PRBCs 1/28, 3U PRBCs 1/31, 3U PRBCs on 2/1. Patient also required Vitamin K and FFP. Hemoglobin decreased to 7.4, however no melena.   -CBC BID  -Transfuse for Hgb 7.0 or hemodynamically significant bleeding  A. Fib with RVR  PVCs Transient Ventricular Bigeminy  New diagnosis in the setting of acute illness occurring for several hours in the morning of 2/2. Holding off on anticoagulation given ABLA with transient hemorrhagic shock. Rate treated with Dilt gtt for several hours however transitioned off on 2/2. No additional episodes of A. Fib on telemetry.   Leukocytosis  Suspect reactive in the setting of GIB and surgical intervention. CXR without opacities. Low suspicion for infection at this time.   Hypertension  Only home medication listed is Carvedilol. Often experiences dialysis associated hypotension. Due to this, may need to have a higher BP goal for now.   - Hold home Coreg - Continue Amlodipine 5 mg daily. Consider increasing to 10 mg if no hypotension with dialysis today - Hydralazine 10 mg IV every 4 hours PRN for SBP > 170 or DBP > 90  ESRD on HD - Appreciate HD management - HD as per Nephro recs.   Chronic Pain Syndrome - Continue home Oxycodone 10 mg BID PRN  Best Practice: (right click and "Reselect all SmartList Selections" daily)   Diet/type: CLD DVT prophylaxis: SCD GI prophylaxis: PPI Lines: N/A Foley:  N/A Code Status:  full code Last date of multidisciplinary goals of care discussion: N/A  Labs:  CBC: Recent Labs  Lab 11/15/21 0233 11/15/21 0805 11/16/21 1148 11/16/21 2216 11/16/21 2233 11/17/21 0604 11/17/21 1629 11/18/21 0030  WBC 11.5*  --  16.3*  --   --  20.1* 23.9*  24.6*  NEUTROABS  --   --  14.2*  --   --  17.2* 20.0* 20.6*  HGB 8.2*   < > 4.1* 8.8* 8.8* 8.9* 8.4* 7.4*  HCT 24.3*   < > 11.4* 25.1* 26.0* 24.7* 23.3* 21.6*  MCV 85.3  --  83.8  --   --  79.4* 80.1 82.4  PLT 284  --  139*  --   --  183 208 236   < > = values in this interval not displayed.    Basic Metabolic Panel: Recent Labs  Lab 11/14/21 1015 11/15/21 0233 11/16/21 1148 11/16/21 2233 11/17/21 0603 11/18/21 0151  NA 131* 130* 131* 126* 132* 130*  K 3.5 4.0 5.2* 6.6* 4.6 5.1  CL 94* 95* 98 92* 96* 94*  CO2 27 22 20*  --  24 23  GLUCOSE 80 101* 96 90 94 92  BUN 35* 21* 47* 67* 37* 49*  CREATININE 3.91* 3.03* 3.92* 4.70* 3.08* 4.10*  CALCIUM 7.7* 7.9* 6.8*  --  7.9* 7.6*  PHOS 3.6 3.3 5.8*  --  4.4 5.2*    GFR: Estimated Creatinine Clearance: 19.6  mL/min (A) (by C-G formula based on SCr of 4.1 mg/dL (H)). Recent Labs  Lab 11/16/21 1148 11/17/21 0604 11/17/21 1629 11/18/21 0030  WBC 16.3* 20.1* 23.9* 24.6*    Liver Function Tests: Recent Labs  Lab 11/12/21 1501 11/14/21 1015 11/15/21 0233 11/16/21 1148 11/17/21 0603 11/18/21 0151  AST 22  --   --   --   --   --   ALT 27  --   --   --   --   --   ALKPHOS 55  --   --   --   --   --   BILITOT 0.5  --   --   --   --   --   PROT 5.6*  --   --   --   --   --   ALBUMIN 2.0* 2.1* 2.1* 1.7* 2.1* 1.9*    No results for input(s): LIPASE, AMYLASE in the last 168 hours. No results for input(s): AMMONIA in the last 168 hours.  ABG:    Component Value Date/Time   PHART 7.394 11/05/2021 1043   PCO2ART 44.4 11/05/2021 1043   PO2ART 82 (L) 11/05/2021 1043   HCO3 27.1 11/05/2021 1043   TCO2 24 11/16/2021 2233   ACIDBASEDEF 4.0 (H) 09/09/2016 0931   O2SAT 96.0 11/05/2021 1043     Coagulation Profile: Recent Labs  Lab 11/13/21 0113 11/16/21 1148  INR 1.5* 1.8*    Cardiac Enzymes: No results for input(s): CKTOTAL, CKMB, CKMBINDEX, TROPONINI in the last 168 hours.  HbA1C: No results found for:  HGBA1C  CBG: Recent Labs  Lab 11/15/21 1905 11/16/21 1921 11/16/21 2344 11/17/21 0347 11/17/21 0743  GLUCAP 89 94 86 86 92     Dr. Jose Persia Internal Medicine PGY-3  11/18/2021, 7:44 AM   PCCM Attending:    61 yo M admitted for UGI bleeding, taken for repeat EGD, failed clipping and went to IR for GDA embolization. Given 3u of PRBCs yesterday. Hgb now stable.    BP (!) 179/90    Pulse 99    Temp 100 F (37.8 C) (Axillary)    Resp 16    Ht 6\' 1"  (1.854 m)    Wt 72.2 kg    SpO2 98%    BMI 21.00 kg/m   Gen: thin chronically ill appearing male HENT: tracking appropriately  Abd: mildy distended, BS present  Heart: RRR s1 s2  Lungs: CTAB, no crackles    Labs reviewed    A:  Duodenal Ulcer Acute UGIB  Acute blood loss anemia  - s/p clipping, egd, and IR embolization ESRD on HD  Atrial fibrillation, not anticoagulated    P: If continues to bleed then will need surgery evaluation  Continue PPI  HD per nephro as tolerated  Holding anticoagulation  Hold diltiazem  Chronic pain on oxycodone continued    Stable for transfer from the ICU     [facility] 11/18/2021 7:10 AM

## 2021-11-19 DIAGNOSIS — R109 Unspecified abdominal pain: Secondary | ICD-10-CM

## 2021-11-19 DIAGNOSIS — G8918 Other acute postprocedural pain: Secondary | ICD-10-CM

## 2021-11-19 LAB — CBC WITH DIFFERENTIAL/PLATELET
Abs Immature Granulocytes: 0.26 10*3/uL — ABNORMAL HIGH (ref 0.00–0.07)
Abs Immature Granulocytes: 0.3 10*3/uL — ABNORMAL HIGH (ref 0.00–0.07)
Basophils Absolute: 0 10*3/uL (ref 0.0–0.1)
Basophils Absolute: 0 10*3/uL (ref 0.0–0.1)
Basophils Relative: 0 %
Basophils Relative: 0 %
Eosinophils Absolute: 0.1 10*3/uL (ref 0.0–0.5)
Eosinophils Absolute: 0.1 10*3/uL (ref 0.0–0.5)
Eosinophils Relative: 1 %
Eosinophils Relative: 1 %
HCT: 20.5 % — ABNORMAL LOW (ref 39.0–52.0)
HCT: 20 % — ABNORMAL LOW (ref 39.0–52.0)
Hemoglobin: 6.8 g/dL — CL (ref 13.0–17.0)
Hemoglobin: 6.8 g/dL — CL (ref 13.0–17.0)
Immature Granulocytes: 1 %
Immature Granulocytes: 2 %
Lymphocytes Relative: 5 %
Lymphocytes Relative: 5 %
Lymphs Abs: 1 10*3/uL (ref 0.7–4.0)
Lymphs Abs: 0.9 10*3/uL (ref 0.7–4.0)
MCH: 28.5 pg (ref 26.0–34.0)
MCH: 29.2 pg (ref 26.0–34.0)
MCHC: 33.2 g/dL (ref 30.0–36.0)
MCHC: 34 g/dL (ref 30.0–36.0)
MCV: 85.8 fL (ref 80.0–100.0)
MCV: 85.8 fL (ref 80.0–100.0)
Monocytes Absolute: 2 10*3/uL — ABNORMAL HIGH (ref 0.1–1.0)
Monocytes Absolute: 2.3 10*3/uL — ABNORMAL HIGH (ref 0.1–1.0)
Monocytes Relative: 9 %
Monocytes Relative: 12 %
Neutro Abs: 17.7 10*3/uL — ABNORMAL HIGH (ref 1.7–7.7)
Neutro Abs: 16 10*3/uL — ABNORMAL HIGH (ref 1.7–7.7)
Neutrophils Relative %: 84 %
Neutrophils Relative %: 80 %
Platelets: 199 10*3/uL (ref 150–400)
Platelets: 228 10*3/uL (ref 150–400)
RBC: 2.39 MIL/uL — ABNORMAL LOW (ref 4.22–5.81)
RBC: 2.33 MIL/uL — ABNORMAL LOW (ref 4.22–5.81)
RDW: 20.3 % — ABNORMAL HIGH (ref 11.5–15.5)
RDW: 21.1 % — ABNORMAL HIGH (ref 11.5–15.5)
WBC: 21.1 10*3/uL — ABNORMAL HIGH (ref 4.0–10.5)
WBC: 19.6 10*3/uL — ABNORMAL HIGH (ref 4.0–10.5)
nRBC: 0 % (ref 0.0–0.2)
nRBC: 0 % (ref 0.0–0.2)

## 2021-11-19 LAB — RENAL FUNCTION PANEL
Albumin: 1.8 g/dL — ABNORMAL LOW (ref 3.5–5.0)
Anion gap: 10 (ref 5–15)
BUN: 39 mg/dL — ABNORMAL HIGH (ref 6–20)
CO2: 25 mmol/L (ref 22–32)
Calcium: 7.6 mg/dL — ABNORMAL LOW (ref 8.9–10.3)
Chloride: 95 mmol/L — ABNORMAL LOW (ref 98–111)
Creatinine, Ser: 4.12 mg/dL — ABNORMAL HIGH (ref 0.61–1.24)
GFR, Estimated: 16 mL/min — ABNORMAL LOW (ref >60)
Glucose, Bld: 71 mg/dL (ref 70–99)
Phosphorus: 5 mg/dL — ABNORMAL HIGH (ref 2.5–4.6)
Potassium: 4.4 mmol/L (ref 3.5–5.1)
Sodium: 130 mmol/L — ABNORMAL LOW (ref 135–145)

## 2021-11-19 LAB — TSH: TSH: 3.168 u[IU]/mL (ref 0.350–4.500)

## 2021-11-19 LAB — CBC
HCT: 22.2 % — ABNORMAL LOW (ref 39.0–52.0)
Hemoglobin: 7.3 g/dL — ABNORMAL LOW (ref 13.0–17.0)
MCH: 28.1 pg (ref 26.0–34.0)
MCHC: 32.9 g/dL (ref 30.0–36.0)
MCV: 85.4 fL (ref 80.0–100.0)
Platelets: 222 10*3/uL (ref 150–400)
RBC: 2.6 MIL/uL — ABNORMAL LOW (ref 4.22–5.81)
RDW: 19.9 % — ABNORMAL HIGH (ref 11.5–15.5)
WBC: 15.6 10*3/uL — ABNORMAL HIGH (ref 4.0–10.5)
nRBC: 0 % (ref 0.0–0.2)

## 2021-11-19 LAB — PREPARE RBC (CROSSMATCH)

## 2021-11-19 MED ORDER — PROSOURCE PLUS PO LIQD
30.0000 mL | Freq: Two times a day (BID) | ORAL | Status: DC
  Filled 2021-11-19 (×2): qty 30

## 2021-11-19 MED ORDER — METOPROLOL TARTRATE 25 MG PO TABS
25.0000 mg | ORAL_TABLET | Freq: Two times a day (BID) | ORAL | Status: DC
  Administered 2021-11-19 – 2021-11-20 (×2): 25 mg via ORAL
  Filled 2021-11-19 (×2): qty 1

## 2021-11-19 MED ORDER — SODIUM CHLORIDE 0.9% IV SOLUTION: Freq: Once | INTRAVENOUS | Status: AC

## 2021-11-19 NOTE — Progress Notes (Signed)
PT Cancellation Note  Patient Details Name: Timothy [name] MRN: [id number] DOB: [birth year]   Cancelled Treatment:    Reason Eval/Treat Not Completed: Patient declined, no reason specified Patient adamantly refused mobility this date and stated "I will do it tomorrow". PT will re-attempt at later date.   Stefano Trulson A. Gilford Rile PT, DPT Acute Rehabilitation Services Pager [phone] Office [phone]    Linna Hoff 11/19/2021, 2:13 PM

## 2021-11-19 NOTE — Progress Notes (Signed)
PROGRESS NOTE                                                                                                                                                                                                             Patient Demographics:    Timothy [name], is a 61 y.o. male, DOB - 1961/07/18, MLJ:[id number]  Outpatient Primary MD for the patient is Osei-Bonsu, Iona Beard, MD    LOS - 7  Admit date - 11/12/2021    Chief Complaint  Patient presents with   Rectal Bleeding       Brief Narrative (HPI from H&P)   61 year old gentleman with history of ESRD TTS schedule, GERD with duodenal ulcer, hypertension, chronic pain on oxycodone who was brought in by EMS after he was found to have large rectal bleeding with acute blood loss related anemia, he was found to have a hemoglobin of 5.1 in the ER and admitted to ICU.  He required multiple units of packed RBC transfusion, he was seen by GI underwent EGD on 11/16/2021 showing a duodenal ulcer with spurting blood and adherent clot, IR was consulted and he subsequently underwent embolization on 11/16/2021.  H&H stabilized somewhat and he was transferred to hospitalist service on 11/19/2021.  This morning his H&H has dropped a unit again however no signs of active ongoing bleeding will be monitored closely.   Subjective:    Timothy [name] today has, No headache, No chest pain, No abdominal pain - No Nausea, No new weakness tingling or numbness, no SOB.   Assessment  & Plan :    Assessment and Plan: No notes have been filed under this hospital service. Service: Hospitalist   Acute blood loss related anemia due to upper GI acute duodenal bleed - he has received multiple units of packed RBCs in ICU, will be getting 2 more units of packed RBCs on 11/19/2021.  He is s/p EGD showing actively bleeding duodenal ulcer he is s/p embolization by IR on 11/16/2021.  Currently no signs of active bleeding but  will be monitored closely.  Continue PPI.  Continue to monitor H&H.  2.  ESRD.  On TTS schedule.  3.  Paroxysmal A. fib RVR.  This is new finding in the setting of severe anemia and acute illness.  He was briefly on diltiazem drip.  He is since converted to  sinus rhythm in ICU and has been transition out to my service in sinus.  His [place] vas 2 score will be greater than 2.  However at this time extremely poor candidate for anticoagulation due to #1 above.  If remains stable will have him follow-up with cardiology outpatient within 1 to 2 weeks of discharge.  Currently stable in sinus rhythm.  4.  HTN.  Blood pressure is stabilizing we will place him on Lopressor and monitor.  5.  Past history of colon cancer.  Follow with PCP and primary GI post discharge.  6.  Chronic pain and remote history of substance abuse.  Monitor with supportive care.      Condition - Extremely Guarded  Family Communication  :  None present  Code Status :  Full  Consults  :  Renal, GI, IR, PCCM  PUD Prophylaxis : PPI   Procedures  :     IR - Angiogram 11/16/21 - 1. No evidence of active extravasation about the duodenum, however multifocal vessel irregularity was observed. 2. Technically successful Gel-Foam slurry embolization of the anterior and posterior pancreaticoduodenal arcades.      Disposition Plan  :    Status is: Inpatient - GI Bleed   DVT Prophylaxis  :    Place TED hose Start: 11/19/21 0900 SCDs Start: 11/12/21 2005   Lab Results  Component Value Date   PLT 199 11/19/2021    Diet :  Diet Order             Diet renal with fluid restriction Fluid restriction: 1200 mL Fluid; Room service appropriate? Yes; Fluid consistency: Thin  Diet effective now                    Inpatient Medications  Scheduled Meds:  sodium chloride   Intravenous Once   sodium chloride   Intravenous Once   doxercalciferol  2 mcg Intravenous Q T,Th,Sa-HD   metoprolol tartrate  25 mg Oral BID    pantoprazole  40 mg Oral BID   sevelamer carbonate  1,600 mg Oral TID with meals   Continuous Infusions:  sodium chloride 10 mL/hr (11/16/21 1200)   sodium chloride     PRN Meds:.fentaNYL (SUBLIMAZE) injection, hydrALAZINE, ipratropium-albuterol, oxyCODONE  Antibiotics  :    Anti-infectives (From admission, onward)    None        Time Spent in minutes  30   Lala Lund M.D on 11/19/2021 at 9:08 AM  To page go to [email or web address]   [facility] -  Office  [phone]  See all Orders from today for further details    Objective:   Vitals:   11/18/21 2150 11/19/21 0431 11/19/21 0439 11/19/21 0823  BP:  115/84  129/61  Pulse: (!) 101 100  (!) 101  Resp: 18 16  18   Temp:  98.9 F (37.2 C)  98.6 F (37 C)  TempSrc:  Oral  Oral  SpO2:  95%  98%  Weight:   70.7 kg   Height:        Wt Readings from Last 3 Encounters:  11/19/21 70.7 kg  11/08/21 71.5 kg  11/02/21 66.8 kg     Intake/Output Summary (Last 24 hours) at 11/19/2021 0908 Last data filed at 11/18/2021 1730 Gross per 24 hour  Intake 220 ml  Output 2300 ml  Net -2080 ml     Physical Exam  Awake Alert, No new F.N deficits, Normal affect Kemp.AT,PERRAL Supple Neck, No JVD,   Symmetrical  Chest wall movement, Good air movement bilaterally, CTAB RRR,No Gallops, Rubs or new Murmurs,  +ve B.Sounds, Abd Soft, No tenderness,   No Cyanosis, trace edema in feet     RN pressure injury documentation: Pressure Injury 11/12/21 Coccyx Right;Upper Stage 2 -  Partial thickness loss of dermis presenting as a shallow open injury with a red, pink wound bed without slough. oval shaped opening with smooth edges and pink/red center (Active)  11/12/21 2100  Location: Coccyx  Location Orientation: Right;Upper  Staging: Stage 2 -  Partial thickness loss of dermis presenting as a shallow open injury with a red, pink wound bed without slough.  Wound Description (Comments): oval shaped opening with smooth edges and  pink/red center  Present on Admission: Yes     Data Review:    CBC Recent Labs  Lab 11/17/21 0604 11/17/21 1629 11/18/21 0030 11/18/21 1621 11/19/21 0452  WBC 20.1* 23.9* 24.6* 25.2* 21.1*  HGB 8.9* 8.4* 7.4* 8.1* 6.8*  HCT 24.7* 23.3* 21.6* 24.2* 20.5*  PLT 183 208 236 229 199  MCV 79.4* 80.1 82.4 84.3 85.8  MCH 28.6 28.9 28.2 28.2 28.5  MCHC 36.0 36.1* 34.3 33.5 33.2  RDW 18.7* 19.4* 20.2* 20.3* 20.3*  LYMPHSABS 0.9 0.9 1.0 0.3* 1.0  MONOABS 1.8* 2.7* 2.5* 1.0 2.0*  EOSABS 0.0 0.0 0.1 0.0 0.1  BASOSABS 0.0 0.0 0.0 0.0 0.0    Electrolytes Recent Labs  Lab 11/12/21 1501 11/12/21 1526 11/13/21 0113 11/14/21 0745 11/15/21 0233 11/16/21 1148 11/16/21 2233 11/17/21 0603 11/18/21 0151 11/19/21 0452  NA 138   < > 132*   < > 130* 131* 126* 132* 130* 130*  K 4.1   < > 4.2   < > 4.0 5.2* 6.6* 4.6 5.1 4.4  CL 98   < > 92*   < > 95* 98 92* 96* 94* 95*  CO2 27  --  25   < > 22 20*  --  24 23 25   GLUCOSE 130*   < > 83   < > 101* 96 90 94 92 71  BUN 41*   < > 53*   < > 21* 47* 67* 37* 49* 39*  CREATININE 4.68*   < > 5.02*   < > 3.03* 3.92* 4.70* 3.08* 4.10* 4.12*  CALCIUM 7.7*  --  7.7*   < > 7.9* 6.8*  --  7.9* 7.6* 7.6*  AST 22  --   --   --   --   --   --   --   --   --   ALT 27  --   --   --   --   --   --   --   --   --   ALKPHOS 55  --   --   --   --   --   --   --   --   --   BILITOT 0.5  --   --   --   --   --   --   --   --   --   ALBUMIN 2.0*  --   --    < > 2.1* 1.7*  --  2.1* 1.9* 1.8*  INR  --   --  1.5*  --   --  1.8*  --   --   --   --    < > = values in this interval not displayed.    ------------------------------------------------------------------------------------------------------------------ No results for input(s): CHOL,  HDL, LDLCALC, TRIG, CHOLHDL, LDLDIRECT in the last 72 hours.  No results found for: HGBA1C  No results for input(s): TSH, T4TOTAL, T3FREE, THYROIDAB in the last 72 hours.  Invalid input(s):  FREET3 ------------------------------------------------------------------------------------------------------------------ ID Labs Recent Labs  Lab 11/16/21 1148 11/16/21 2233 11/17/21 0603 11/17/21 0604 11/17/21 1629 11/18/21 0030 11/18/21 0151 11/18/21 1621 11/19/21 0452  WBC 16.3*  --   --  20.1* 23.9* 24.6*  --  25.2* 21.1*  PLT 139*  --   --  183 208 236  --  229 199  CREATININE 3.92* 4.70* 3.08*  --   --   --  4.10*  --  4.12*   Cardiac Enzymes No results for input(s): CKMB, TROPONINI, MYOGLOBIN in the last 168 hours.  Invalid input(s): CK  Radiology Reports DG Abd 1 View  Result Date: 11/17/2021 CLINICAL DATA:  61 year old male status post mesenteric angiogram and Gel-Foam embolization for GI bleeding. EXAM: ABDOMEN - 1 VIEW COMPARISON:  CT Abdomen and Pelvis 11/15/2021 and earlier. FINDINGS: Portable AP supine view at 0538 hours. Embolization coils now at the gastroduodenal artery level. Multiple other abdominal surgical clips mostly on the right. Nonobstructed bowel-gas pattern. No definite pneumoperitoneum on this supine view. Trace retained oral contrast suspected at the gastric cardia. Gray hazy opacity in the abdomen in keeping with ascites by CT. Negative lung bases. No acute osseous abnormality identified. IMPRESSION: 1. Nonobstructed bowel-gas pattern. 2. Ascites. 3. Recent gastroduodenal artery coil embolization. Electronically Signed   By: Genevie Ann M.D.   On: 11/17/2021 06:21   IR Angiogram Visceral Selective  Result Date: 11/16/2021 INDICATION: 61 year old male with history of recent gastrointestinal hemorrhage status post gastroduodenal artery coil embolization on 11/05/2021, currently admitted for rebleeding episode with EGD demonstrating hemorrhage from the duodenum. Endoscopic intervention was unsuccessful. EXAM: 1. Ultrasound-guided vascular access of the right common femoral artery. 2. Selective catheterization angiography of the celiac artery and superior  mesenteric artery. 3. Sub selective catheterization and angiography of anterior and posterior pancreaticoduodenal branches. 4. Gel-Foam embolization of anterior and posterior pancreaticoduodenal branches. MEDICATIONS: None. ANESTHESIA/SEDATION: Moderate (conscious) sedation was employed during this procedure. A total of Versed 1.5 mg and Fentanyl 75 mcg was administered intravenously. Moderate Sedation Time: 110 minutes. The patient's level of consciousness and vital signs were monitored continuously by radiology nursing throughout the procedure under my direct supervision. CONTRAST:  74mL OMNIPAQUE IOHEXOL 300 MG/ML SOLN, 63mL OMNIPAQUE IOHEXOL 300 MG/ML SOLN, 19mL OMNIPAQUE IOHEXOL 300 MG/ML SOLN FLUOROSCOPY: 34 minutes, 42 seconds. Radiation Exposure Index (as provided by the fluoroscopic device): 5,361 mGy Kerma COMPLICATIONS: None immediate. PROCEDURE: Informed consent was obtained from the patient following explanation of the procedure, risks, benefits and alternatives. The patient understands, agrees and consents for the procedure. All questions were addressed. A time out was performed prior to the initiation of the procedure. Maximal barrier sterile technique utilized including caps, mask, sterile gowns, sterile gloves, large sterile drape, hand hygiene, and Betadine prep. The right groin was prepped and draped in standard fashion. Preprocedure ultrasound demonstrated widely patent right common femoral artery. The procedure was planned. Subdermal Local anesthesia provided 1% lidocaine. A small skin nick was made. Under direct ultrasound visualization, the right common femoral is accessed with a 21 gauge micropuncture needle. A permanent ultrasound image was captured and stored in the record. Micropuncture sheath was introduced and limited right lower extremity angiogram was performed which demonstrated adequate puncture site for closure device use. A J wire was directed to the micropuncture set to the  abdominal  aorta and a 5 [place], 10 cm vascular sheath was placed. Five French C2 catheter was then advanced to the level of the celiac artery and celiac angiogram was performed. Angiogram demonstrated widely patent celiac and hepatic arteries with completely embolized gastroduodenal artery with indwelling coils. The catheter was then advanced to the level of the superior mesenteric artery. Angiogram was performed which demonstrated widely patent superior mesenteric artery and branch vessel supplying the duodenum without evidence of active extravasation. Selective catheterization of the pancreaticoduodenal arcade from the proximal super mesenteric artery was attempted with multiple catheter and wire combinations, however unsuccessful due to tortuosity. Therefore, the 5 [place] sheath was exchanged for a 7 [place], 55 cm Tourguide steerable sheath. The sheath was positioned near the ostium of the SMA through which a 5 [place], angled tip glide catheter was advanced and used to help CT the steerable sheath. Next, the anterior pancreaticoduodenal arcade was selected with a Progreat alpha microcatheter and fathom 14 microwire. Angiogram was performed which demonstrated multifocal irregularity of the vessel supplying the duodenum however no evidence of active, intraluminal extravasation. Given irregularity of the vessels, Gel-Foam slurry embolization was administered to near stasis. No evidence of reflux was observed. The microsystem was then retracted and repositioned within the posterior pancreaticoduodenal arcade. Sub selective catheterization was performed angiogram demonstrated duodenal supply with irregularity of the distal branch vessels, however no evidence of active intraluminal extravasation. Gel-Foam slurry embolization was also performed at this location. The microsystem was removed and repeat SMA angiogram was performed which again demonstrated no evidence of active extravasation about the duodenum however there  was some vessel pruning observed. The catheter was removed. The sheath was exchanged for an 8 French Angio-Seal device which was deployed successfully without difficulty. Distal pulses were unchanged. The patient tolerated the procedure well was transferred back to the floor in stable condition. IMPRESSION: 1. No evidence of active extravasation about the duodenum, however multifocal vessel irregularity was observed. 2. Technically successful Gel-Foam slurry embolization of the anterior and posterior pancreaticoduodenal arcades. Ruthann Cancer, MD [facility] Radiology Electronically Signed   By: Ruthann Cancer M.D.   On: 11/16/2021 15:18   IR Angiogram Visceral Selective  Result Date: 11/16/2021 INDICATION: 61 year old male with history of recent gastrointestinal hemorrhage status post gastroduodenal artery coil embolization on 11/05/2021, currently admitted for rebleeding episode with EGD demonstrating hemorrhage from the duodenum. Endoscopic intervention was unsuccessful. EXAM: 1. Ultrasound-guided vascular access of the right common femoral artery. 2. Selective catheterization angiography of the celiac artery and superior mesenteric artery. 3. Sub selective catheterization and angiography of anterior and posterior pancreaticoduodenal branches. 4. Gel-Foam embolization of anterior and posterior pancreaticoduodenal branches. MEDICATIONS: None. ANESTHESIA/SEDATION: Moderate (conscious) sedation was employed during this procedure. A total of Versed 1.5 mg and Fentanyl 75 mcg was administered intravenously. Moderate Sedation Time: 110 minutes. The patient's level of consciousness and vital signs were monitored continuously by radiology nursing throughout the procedure under my direct supervision. CONTRAST:  43mL OMNIPAQUE IOHEXOL 300 MG/ML SOLN, 44mL OMNIPAQUE IOHEXOL 300 MG/ML SOLN, 6mL OMNIPAQUE IOHEXOL 300 MG/ML SOLN FLUOROSCOPY: 34 minutes, 42 seconds. Radiation  Exposure Index (as provided by the fluoroscopic device): 4,403 mGy Kerma COMPLICATIONS: None immediate. PROCEDURE: Informed consent was obtained from the patient following explanation of the procedure, risks, benefits and alternatives. The patient understands, agrees and consents for the procedure. All questions were addressed. A time out was performed prior to the initiation of the procedure. Maximal barrier sterile technique utilized including caps, mask, sterile gowns,  sterile gloves, large sterile drape, hand hygiene, and Betadine prep. The right groin was prepped and draped in standard fashion. Preprocedure ultrasound demonstrated widely patent right common femoral artery. The procedure was planned. Subdermal Local anesthesia provided 1% lidocaine. A small skin nick was made. Under direct ultrasound visualization, the right common femoral is accessed with a 21 gauge micropuncture needle. A permanent ultrasound image was captured and stored in the record. Micropuncture sheath was introduced and limited right lower extremity angiogram was performed which demonstrated adequate puncture site for closure device use. A J wire was directed to the micropuncture set to the abdominal aorta and a 5 French, 10 cm vascular sheath was placed. Five French C2 catheter was then advanced to the level of the celiac artery and celiac angiogram was performed. Angiogram demonstrated widely patent celiac and hepatic arteries with completely embolized gastroduodenal artery with indwelling coils. The catheter was then advanced to the level of the superior mesenteric artery. Angiogram was performed which demonstrated widely patent superior mesenteric artery and branch vessel supplying the duodenum without evidence of active extravasation. Selective catheterization of the pancreaticoduodenal arcade from the proximal super mesenteric artery was attempted with multiple catheter and wire combinations, however unsuccessful due to tortuosity.  Therefore, the 5 [place] sheath was exchanged for a 7 [place], 55 cm Tourguide steerable sheath. The sheath was positioned near the ostium of the SMA through which a 5 [place], angled tip glide catheter was advanced and used to help CT the steerable sheath. Next, the anterior pancreaticoduodenal arcade was selected with a Progreat alpha microcatheter and fathom 14 microwire. Angiogram was performed which demonstrated multifocal irregularity of the vessel supplying the duodenum however no evidence of active, intraluminal extravasation. Given irregularity of the vessels, Gel-Foam slurry embolization was administered to near stasis. No evidence of reflux was observed. The microsystem was then retracted and repositioned within the posterior pancreaticoduodenal arcade. Sub selective catheterization was performed angiogram demonstrated duodenal supply with irregularity of the distal branch vessels, however no evidence of active intraluminal extravasation. Gel-Foam slurry embolization was also performed at this location. The microsystem was removed and repeat SMA angiogram was performed which again demonstrated no evidence of active extravasation about the duodenum however there was some vessel pruning observed. The catheter was removed. The sheath was exchanged for an 8 French Angio-Seal device which was deployed successfully without difficulty. Distal pulses were unchanged. The patient tolerated the procedure well was transferred back to the floor in stable condition. IMPRESSION: 1. No evidence of active extravasation about the duodenum, however multifocal vessel irregularity was observed. 2. Technically successful Gel-Foam slurry embolization of the anterior and posterior pancreaticoduodenal arcades. Ruthann Cancer, MD [facility] Radiology Electronically Signed   By: Ruthann Cancer M.D.   On: 11/16/2021 15:18   IR Angiogram Selective Each Additional Vessel  Result Date:  11/16/2021 INDICATION: 61 year old male with history of recent gastrointestinal hemorrhage status post gastroduodenal artery coil embolization on 11/05/2021, currently admitted for rebleeding episode with EGD demonstrating hemorrhage from the duodenum. Endoscopic intervention was unsuccessful. EXAM: 1. Ultrasound-guided vascular access of the right common femoral artery. 2. Selective catheterization angiography of the celiac artery and superior mesenteric artery. 3. Sub selective catheterization and angiography of anterior and posterior pancreaticoduodenal branches. 4. Gel-Foam embolization of anterior and posterior pancreaticoduodenal branches. MEDICATIONS: None. ANESTHESIA/SEDATION: Moderate (conscious) sedation was employed during this procedure. A total of Versed 1.5 mg and Fentanyl 75 mcg was administered intravenously. Moderate Sedation Time: 110 minutes. The patient's level of consciousness and  vital signs were monitored continuously by radiology nursing throughout the procedure under my direct supervision. CONTRAST:  1mL OMNIPAQUE IOHEXOL 300 MG/ML SOLN, 55mL OMNIPAQUE IOHEXOL 300 MG/ML SOLN, 43mL OMNIPAQUE IOHEXOL 300 MG/ML SOLN FLUOROSCOPY: 34 minutes, 42 seconds. Radiation Exposure Index (as provided by the fluoroscopic device): 4,782 mGy Kerma COMPLICATIONS: None immediate. PROCEDURE: Informed consent was obtained from the patient following explanation of the procedure, risks, benefits and alternatives. The patient understands, agrees and consents for the procedure. All questions were addressed. A time out was performed prior to the initiation of the procedure. Maximal barrier sterile technique utilized including caps, mask, sterile gowns, sterile gloves, large sterile drape, hand hygiene, and Betadine prep. The right groin was prepped and draped in standard fashion. Preprocedure ultrasound demonstrated widely patent right common femoral artery. The procedure was planned. Subdermal Local anesthesia  provided 1% lidocaine. A small skin nick was made. Under direct ultrasound visualization, the right common femoral is accessed with a 21 gauge micropuncture needle. A permanent ultrasound image was captured and stored in the record. Micropuncture sheath was introduced and limited right lower extremity angiogram was performed which demonstrated adequate puncture site for closure device use. A J wire was directed to the micropuncture set to the abdominal aorta and a 5 French, 10 cm vascular sheath was placed. Five French C2 catheter was then advanced to the level of the celiac artery and celiac angiogram was performed. Angiogram demonstrated widely patent celiac and hepatic arteries with completely embolized gastroduodenal artery with indwelling coils. The catheter was then advanced to the level of the superior mesenteric artery. Angiogram was performed which demonstrated widely patent superior mesenteric artery and branch vessel supplying the duodenum without evidence of active extravasation. Selective catheterization of the pancreaticoduodenal arcade from the proximal super mesenteric artery was attempted with multiple catheter and wire combinations, however unsuccessful due to tortuosity. Therefore, the 5 [place] sheath was exchanged for a 7 [place], 55 cm Tourguide steerable sheath. The sheath was positioned near the ostium of the SMA through which a 5 [place], angled tip glide catheter was advanced and used to help CT the steerable sheath. Next, the anterior pancreaticoduodenal arcade was selected with a Progreat alpha microcatheter and fathom 14 microwire. Angiogram was performed which demonstrated multifocal irregularity of the vessel supplying the duodenum however no evidence of active, intraluminal extravasation. Given irregularity of the vessels, Gel-Foam slurry embolization was administered to near stasis. No evidence of reflux was observed. The microsystem was then retracted and repositioned within the  posterior pancreaticoduodenal arcade. Sub selective catheterization was performed angiogram demonstrated duodenal supply with irregularity of the distal branch vessels, however no evidence of active intraluminal extravasation. Gel-Foam slurry embolization was also performed at this location. The microsystem was removed and repeat SMA angiogram was performed which again demonstrated no evidence of active extravasation about the duodenum however there was some vessel pruning observed. The catheter was removed. The sheath was exchanged for an 8 French Angio-Seal device which was deployed successfully without difficulty. Distal pulses were unchanged. The patient tolerated the procedure well was transferred back to the floor in stable condition. IMPRESSION: 1. No evidence of active extravasation about the duodenum, however multifocal vessel irregularity was observed. 2. Technically successful Gel-Foam slurry embolization of the anterior and posterior pancreaticoduodenal arcades. Ruthann Cancer, MD [facility] Radiology Electronically Signed   By: Ruthann Cancer M.D.   On: 11/16/2021 15:18   IR US Guide Vasc Access Right  Result Date: 11/16/2021 INDICATION: 61 year old male with history  of recent gastrointestinal hemorrhage status post gastroduodenal artery coil embolization on 11/05/2021, currently admitted for rebleeding episode with EGD demonstrating hemorrhage from the duodenum. Endoscopic intervention was unsuccessful. EXAM: 1. Ultrasound-guided vascular access of the right common femoral artery. 2. Selective catheterization angiography of the celiac artery and superior mesenteric artery. 3. Sub selective catheterization and angiography of anterior and posterior pancreaticoduodenal branches. 4. Gel-Foam embolization of anterior and posterior pancreaticoduodenal branches. MEDICATIONS: None. ANESTHESIA/SEDATION: Moderate (conscious) sedation was employed during this  procedure. A total of Versed 1.5 mg and Fentanyl 75 mcg was administered intravenously. Moderate Sedation Time: 110 minutes. The patient's level of consciousness and vital signs were monitored continuously by radiology nursing throughout the procedure under my direct supervision. CONTRAST:  28mL OMNIPAQUE IOHEXOL 300 MG/ML SOLN, 94mL OMNIPAQUE IOHEXOL 300 MG/ML SOLN, 77mL OMNIPAQUE IOHEXOL 300 MG/ML SOLN FLUOROSCOPY: 34 minutes, 42 seconds. Radiation Exposure Index (as provided by the fluoroscopic device): 2,423 mGy Kerma COMPLICATIONS: None immediate. PROCEDURE: Informed consent was obtained from the patient following explanation of the procedure, risks, benefits and alternatives. The patient understands, agrees and consents for the procedure. All questions were addressed. A time out was performed prior to the initiation of the procedure. Maximal barrier sterile technique utilized including caps, mask, sterile gowns, sterile gloves, large sterile drape, hand hygiene, and Betadine prep. The right groin was prepped and draped in standard fashion. Preprocedure ultrasound demonstrated widely patent right common femoral artery. The procedure was planned. Subdermal Local anesthesia provided 1% lidocaine. A small skin nick was made. Under direct ultrasound visualization, the right common femoral is accessed with a 21 gauge micropuncture needle. A permanent ultrasound image was captured and stored in the record. Micropuncture sheath was introduced and limited right lower extremity angiogram was performed which demonstrated adequate puncture site for closure device use. A J wire was directed to the micropuncture set to the abdominal aorta and a 5 French, 10 cm vascular sheath was placed. Five French C2 catheter was then advanced to the level of the celiac artery and celiac angiogram was performed. Angiogram demonstrated widely patent celiac and hepatic arteries with completely embolized gastroduodenal artery with indwelling  coils. The catheter was then advanced to the level of the superior mesenteric artery. Angiogram was performed which demonstrated widely patent superior mesenteric artery and branch vessel supplying the duodenum without evidence of active extravasation. Selective catheterization of the pancreaticoduodenal arcade from the proximal super mesenteric artery was attempted with multiple catheter and wire combinations, however unsuccessful due to tortuosity. Therefore, the 5 [place] sheath was exchanged for a 7 [place], 55 cm Tourguide steerable sheath. The sheath was positioned near the ostium of the SMA through which a 5 [place], angled tip glide catheter was advanced and used to help CT the steerable sheath. Next, the anterior pancreaticoduodenal arcade was selected with a Progreat alpha microcatheter and fathom 14 microwire. Angiogram was performed which demonstrated multifocal irregularity of the vessel supplying the duodenum however no evidence of active, intraluminal extravasation. Given irregularity of the vessels, Gel-Foam slurry embolization was administered to near stasis. No evidence of reflux was observed. The microsystem was then retracted and repositioned within the posterior pancreaticoduodenal arcade. Sub selective catheterization was performed angiogram demonstrated duodenal supply with irregularity of the distal branch vessels, however no evidence of active intraluminal extravasation. Gel-Foam slurry embolization was also performed at this location. The microsystem was removed and repeat SMA angiogram was performed which again demonstrated no evidence of active extravasation about the duodenum however there was some vessel pruning observed. The catheter  was removed. The sheath was exchanged for an 8 French Angio-Seal device which was deployed successfully without difficulty. Distal pulses were unchanged. The patient tolerated the procedure well was transferred back to the floor in stable condition.  IMPRESSION: 1. No evidence of active extravasation about the duodenum, however multifocal vessel irregularity was observed. 2. Technically successful Gel-Foam slurry embolization of the anterior and posterior pancreaticoduodenal arcades. Ruthann Cancer, MD [facility] Radiology Electronically Signed   By: Ruthann Cancer M.D.   On: 11/16/2021 15:18   DG CHEST PORT 1 VIEW  Result Date: 11/17/2021 CLINICAL DATA:  Hypoxia EXAM: PORTABLE CHEST 1 VIEW COMPARISON:  Previous studies including the examination done on 11/13/2021 FINDINGS: Transverse diameter of heart is increased. Central pulmonary vessels are slightly less prominent. There are linear densities in the parahilar regions and medial right lower lung fields suggesting scarring or subsegmental atelectasis. There is interval improvement in aeration of parahilar regions. No new focal infiltrates are seen. There is no pleural effusion or pneumothorax. IMPRESSION: Cardiomegaly. There is decrease in pulmonary vascular congestion. There are no new focal pulmonary infiltrates. Electronically Signed   By: Elmer Picker M.D.   On: 11/17/2021 09:19   IR EMBO ART  VEN HEMORR LYMPH EXTRAV  INC GUIDE ROADMAPPING  Result Date: 11/16/2021 INDICATION: 61 year old male with history of recent gastrointestinal hemorrhage status post gastroduodenal artery coil embolization on 11/05/2021, currently admitted for rebleeding episode with EGD demonstrating hemorrhage from the duodenum. Endoscopic intervention was unsuccessful. EXAM: 1. Ultrasound-guided vascular access of the right common femoral artery. 2. Selective catheterization angiography of the celiac artery and superior mesenteric artery. 3. Sub selective catheterization and angiography of anterior and posterior pancreaticoduodenal branches. 4. Gel-Foam embolization of anterior and posterior pancreaticoduodenal branches. MEDICATIONS: None. ANESTHESIA/SEDATION: Moderate  (conscious) sedation was employed during this procedure. A total of Versed 1.5 mg and Fentanyl 75 mcg was administered intravenously. Moderate Sedation Time: 110 minutes. The patient's level of consciousness and vital signs were monitored continuously by radiology nursing throughout the procedure under my direct supervision. CONTRAST:  86mL OMNIPAQUE IOHEXOL 300 MG/ML SOLN, 72mL OMNIPAQUE IOHEXOL 300 MG/ML SOLN, 54mL OMNIPAQUE IOHEXOL 300 MG/ML SOLN FLUOROSCOPY: 34 minutes, 42 seconds. Radiation Exposure Index (as provided by the fluoroscopic device): 8,119 mGy Kerma COMPLICATIONS: None immediate. PROCEDURE: Informed consent was obtained from the patient following explanation of the procedure, risks, benefits and alternatives. The patient understands, agrees and consents for the procedure. All questions were addressed. A time out was performed prior to the initiation of the procedure. Maximal barrier sterile technique utilized including caps, mask, sterile gowns, sterile gloves, large sterile drape, hand hygiene, and Betadine prep. The right groin was prepped and draped in standard fashion. Preprocedure ultrasound demonstrated widely patent right common femoral artery. The procedure was planned. Subdermal Local anesthesia provided 1% lidocaine. A small skin nick was made. Under direct ultrasound visualization, the right common femoral is accessed with a 21 gauge micropuncture needle. A permanent ultrasound image was captured and stored in the record. Micropuncture sheath was introduced and limited right lower extremity angiogram was performed which demonstrated adequate puncture site for closure device use. A J wire was directed to the micropuncture set to the abdominal aorta and a 5 French, 10 cm vascular sheath was placed. Five French C2 catheter was then advanced to the level of the celiac artery and celiac angiogram was performed. Angiogram demonstrated widely patent celiac and hepatic arteries with completely  embolized gastroduodenal artery with indwelling coils. The catheter was then  advanced to the level of the superior mesenteric artery. Angiogram was performed which demonstrated widely patent superior mesenteric artery and branch vessel supplying the duodenum without evidence of active extravasation. Selective catheterization of the pancreaticoduodenal arcade from the proximal super mesenteric artery was attempted with multiple catheter and wire combinations, however unsuccessful due to tortuosity. Therefore, the 5 [place] sheath was exchanged for a 7 [place], 55 cm Tourguide steerable sheath. The sheath was positioned near the ostium of the SMA through which a 5 [place], angled tip glide catheter was advanced and used to help CT the steerable sheath. Next, the anterior pancreaticoduodenal arcade was selected with a Progreat alpha microcatheter and fathom 14 microwire. Angiogram was performed which demonstrated multifocal irregularity of the vessel supplying the duodenum however no evidence of active, intraluminal extravasation. Given irregularity of the vessels, Gel-Foam slurry embolization was administered to near stasis. No evidence of reflux was observed. The microsystem was then retracted and repositioned within the posterior pancreaticoduodenal arcade. Sub selective catheterization was performed angiogram demonstrated duodenal supply with irregularity of the distal branch vessels, however no evidence of active intraluminal extravasation. Gel-Foam slurry embolization was also performed at this location. The microsystem was removed and repeat SMA angiogram was performed which again demonstrated no evidence of active extravasation about the duodenum however there was some vessel pruning observed. The catheter was removed. The sheath was exchanged for an 8 French Angio-Seal device which was deployed successfully without difficulty. Distal pulses were unchanged. The patient tolerated the procedure well was transferred  back to the floor in stable condition. IMPRESSION: 1. No evidence of active extravasation about the duodenum, however multifocal vessel irregularity was observed. 2. Technically successful Gel-Foam slurry embolization of the anterior and posterior pancreaticoduodenal arcades. Ruthann Cancer, MD [facility]iology [facility] Radiology Electronically Signed   By: Ruthann Cancer M.D.   On: 11/16/2021 15:18

## 2021-11-19 NOTE — Progress Notes (Signed)
[facility] Progress Note   Subjective:  Seen in room. Denies CP or dyspnea. Denies abdominal pain or bleeding, but Hgb dropped this AM. Looks like already ordered for 2U PRBCs to be given today. Plan is for HD later today.  Objective Vitals:   11/18/21 2150 11/19/21 0431 11/19/21 0439 11/19/21 0823  BP:  115/84  129/61  Pulse: (!) 101 100  (!) 101  Resp: 18 16  18   Temp:  98.9 F (37.2 C)  98.6 F (37 C)  TempSrc:  Oral  Oral  SpO2:  95%  98%  Weight:   70.7 kg   Height:       Physical Exam General: Chronically ill appearing man, NAD. Room air. Heart: RRR Lungs: CTA anteriorly Abdomen: soft, non-tender Extremities: No LE edema Dialysis Access: LUE AVF + thrill  Additional Objective Labs: Basic Metabolic Panel: Recent Labs  Lab 11/17/21 0603 11/18/21 0151 11/19/21 0452  NA 132* 130* 130*  K 4.6 5.1 4.4  CL 96* 94* 95*  CO2 24 23 25   GLUCOSE 94 92 71  BUN 37* 49* 39*  CREATININE 3.08* 4.10* 4.12*  CALCIUM 7.9* 7.6* 7.6*  PHOS 4.4 5.2* 5.0*   Liver Function Tests: Recent Labs  Lab 11/12/21 1501 11/14/21 1015 11/17/21 0603 11/18/21 0151 11/19/21 0452  AST 22  --   --   --   --   ALT 27  --   --   --   --   ALKPHOS 55  --   --   --   --   BILITOT 0.5  --   --   --   --   PROT 5.6*  --   --   --   --   ALBUMIN 2.0*   < > 2.1* 1.9* 1.8*   < > = values in this interval not displayed.   CBC: Recent Labs  Lab 11/17/21 0604 11/17/21 1629 11/18/21 0030 11/18/21 1621 11/19/21 0452  WBC 20.1* 23.9* 24.6* 25.2* 21.1*  NEUTROABS 17.2* 20.0* 20.6* 23.9* 17.7*  HGB 8.9* 8.4* 7.4* 8.1* 6.8*  HCT 24.7* 23.3* 21.6* 24.2* 20.5*  MCV 79.4* 80.1 82.4 84.3 85.8  PLT 183 208 236 229 199   Blood Culture    Component Value Date/Time   SDES PERITONEAL FLUID 06/22/2021 1236   SPECREQUEST ABDOMEN 06/22/2021 1236   CULT  06/22/2021 1236    NO GROWTH 3 DAYS Performed at [facility], [place] [address], [place], [place] [address]    REPTSTATUS  06/26/2021 FINAL 06/22/2021 1236   Medications:  sodium chloride 10 mL/hr (11/16/21 1200)   sodium chloride      sodium chloride   Intravenous Once   sodium chloride   Intravenous Once   doxercalciferol  2 mcg Intravenous Q T,Th,Sa-HD   metoprolol tartrate  25 mg Oral BID   pantoprazole  40 mg Oral BID   sevelamer carbonate  1,600 mg Oral TID with meals    Dialysis Orders: [facility] 3:45 hrs 450/Autoflow 1.5 65 kg 2.0K/2.0 Ca UFP 4 l AVF No heparin -Hectorol 2 mcg IV TIW -Mircera 150 mcg IV q 2 weeks (last dose 11/12/2021) -Venofer 100 mg IV weekly   Assessment/Plan: Recurrent GI bleed: Underwent EGD on 2/1 that showed active bleeding from duodenal ulcer that could not be controlled definitively by clipping or bipolar cautery and thereafter sent to IR for embolization after temporizing measures with hemospray.  He later had mesenteric angiogram with Gelfoam embolization of anterior and  posterior pancreaticoduodenal arcade (this after having undergone greater duodenal artery embolization on 11/05/2021).  Hgb down today although denies any re-bleeding, for 2U PRBCs today. ESRD: Back on usual TTS schedule - for HD today. Anemia (ABLA + ESRD): See # 1. S/p 9U PRBCs + 2U FFP this admit; now ordered for another 2U PRBCs today. Last received Mircera at outpt unit on 1/28 -> not due for repeat dose yet. MBD: Ca/Phos ok for now. Continue Renvela and VDRA. HTN/volume: No LE edema, 5kg above EDW per weights - unclear if accurate. Nutrition: Albumin very low (1.8) - give protein supplements as tolerated.   Veneta Penton, PA-C 11/19/2021, 9:33 AM  [employer]

## 2021-11-19 NOTE — Progress Notes (Signed)
OT Cancellation Note  Patient Details Name: Timothy [name] MRN: [id number] DOB: 1961/05/04   Cancelled Treatment:    Reason Eval/Treat Not Completed: Patient declined, no reason specified. Pt refusing all mobility at this time, reporting that he will do it tomorrow. OT will follow up as time allows.   Tierany Appleby H., OTR/L Acute Rehabilitation  Claudis Giovanelli Elane Yolanda Bonine 11/19/2021, 3:00 PM

## 2021-11-19 NOTE — Progress Notes (Signed)
Critical lab hemoglobin 6.8 Dr Candiss Norse aware. Pt hasnt went to hemodialysis yet to receive the 2 units PRBC. Will check H&H post hemodialysis.

## 2021-11-19 NOTE — Progress Notes (Signed)
Dr. Candiss Norse wants 2 units of blood given with hemodialysis

## 2021-11-19 NOTE — Progress Notes (Signed)
1st unit of blood started. Spoke with hemo didn't have pt on list. Paged Dr. Joylene Grapes regarding hemodialysis schedule and order. He wrote for 1 unit to be transfused in hemodialysis.H&H to be drawn post 2nd unit.

## 2021-11-19 NOTE — Progress Notes (Signed)
eLink Physician-Brief Progress Note Patient Name: Timothy [name] DOB: 1960-10-22 MRN: [id number]   Date of Service  11/19/2021  HPI/Events of Note  Hemoglobin 6.8 gm / dl, no evidence of active bleeding per bedside RN Mindy, no camera in the room for me to assess directly.  eICU Interventions  One unit of PRBC ordered transfused.        Kerry Kass [name] 11/19/2021, 5:48 AM

## 2021-11-20 ENCOUNTER — Inpatient Hospital Stay (HOSPITAL_COMMUNITY): Payer: Medicare (Managed Care) | Admitting: Certified Registered Nurse Anesthetist

## 2021-11-20 ENCOUNTER — Inpatient Hospital Stay (HOSPITAL_COMMUNITY): Payer: Medicare (Managed Care)

## 2021-11-20 ENCOUNTER — Encounter (HOSPITAL_COMMUNITY): Payer: Self-pay | Admitting: Internal Medicine

## 2021-11-20 ENCOUNTER — Encounter (HOSPITAL_COMMUNITY)
Admission: EM | Disposition: A | Discharge status: Home Health | Payer: Self-pay | Source: Home / Self Care | Attending: Internal Medicine

## 2021-11-20 ENCOUNTER — Other Ambulatory Visit: Payer: Self-pay

## 2021-11-20 DIAGNOSIS — R109 Unspecified abdominal pain: Secondary | ICD-10-CM

## 2021-11-20 DIAGNOSIS — N186 End stage renal disease: Secondary | ICD-10-CM

## 2021-11-20 DIAGNOSIS — G8918 Other acute postprocedural pain: Secondary | ICD-10-CM | POA: Diagnosis not present

## 2021-11-20 DIAGNOSIS — K922 Gastrointestinal hemorrhage, unspecified: Secondary | ICD-10-CM | POA: Diagnosis not present

## 2021-11-20 DIAGNOSIS — Z992 Dependence on renal dialysis: Secondary | ICD-10-CM

## 2021-11-20 DIAGNOSIS — K264 Chronic or unspecified duodenal ulcer with hemorrhage: Secondary | ICD-10-CM | POA: Diagnosis not present

## 2021-11-20 HISTORY — PX: LAPAROTOMY: SHX154

## 2021-11-20 LAB — CBC WITH DIFFERENTIAL/PLATELET
Abs Immature Granulocytes: 0.15 10*3/uL — ABNORMAL HIGH (ref 0.00–0.07)
Abs Immature Granulocytes: 0.16 10*3/uL — ABNORMAL HIGH (ref 0.00–0.07)
Basophils Absolute: 0 10*3/uL (ref 0.0–0.1)
Basophils Absolute: 0 10*3/uL (ref 0.0–0.1)
Basophils Relative: 0 %
Basophils Relative: 0 %
Eosinophils Absolute: 0.1 10*3/uL (ref 0.0–0.5)
Eosinophils Absolute: 0.1 10*3/uL (ref 0.0–0.5)
Eosinophils Relative: 1 %
Eosinophils Relative: 0 %
HCT: 15.7 % — ABNORMAL LOW (ref 39.0–52.0)
HCT: 26.1 % — ABNORMAL LOW (ref 39.0–52.0)
Hemoglobin: 5.2 g/dL — CL (ref 13.0–17.0)
Hemoglobin: 9.1 g/dL — ABNORMAL LOW (ref 13.0–17.0)
Immature Granulocytes: 1 %
Immature Granulocytes: 1 %
Lymphocytes Relative: 6 %
Lymphocytes Relative: 2 %
Lymphs Abs: 0.8 10*3/uL (ref 0.7–4.0)
Lymphs Abs: 0.4 10*3/uL — ABNORMAL LOW (ref 0.7–4.0)
MCH: 28.3 pg (ref 26.0–34.0)
MCH: 28.6 pg (ref 26.0–34.0)
MCHC: 33.1 g/dL (ref 30.0–36.0)
MCHC: 34.9 g/dL (ref 30.0–36.0)
MCV: 85.3 fL (ref 80.0–100.0)
MCV: 82.1 fL (ref 80.0–100.0)
Monocytes Absolute: 1.8 10*3/uL — ABNORMAL HIGH (ref 0.1–1.0)
Monocytes Absolute: 0.9 10*3/uL (ref 0.1–1.0)
Monocytes Relative: 13 %
Monocytes Relative: 6 %
Neutro Abs: 10.8 10*3/uL — ABNORMAL HIGH (ref 1.7–7.7)
Neutro Abs: 14.2 10*3/uL — ABNORMAL HIGH (ref 1.7–7.7)
Neutrophils Relative %: 79 %
Neutrophils Relative %: 91 %
Platelets: 174 10*3/uL (ref 150–400)
Platelets: 205 10*3/uL (ref 150–400)
RBC: 1.84 MIL/uL — ABNORMAL LOW (ref 4.22–5.81)
RBC: 3.18 MIL/uL — ABNORMAL LOW (ref 4.22–5.81)
RDW: 18.4 % — ABNORMAL HIGH (ref 11.5–15.5)
RDW: 17.2 % — ABNORMAL HIGH (ref 11.5–15.5)
WBC: 13.6 10*3/uL — ABNORMAL HIGH (ref 4.0–10.5)
WBC: 15.7 10*3/uL — ABNORMAL HIGH (ref 4.0–10.5)
nRBC: 0 % (ref 0.0–0.2)
nRBC: 0.1 % (ref 0.0–0.2)

## 2021-11-20 LAB — POCT I-STAT, CHEM 8
BUN: 66 mg/dL — ABNORMAL HIGH (ref 6–20)
BUN: 69 mg/dL — ABNORMAL HIGH (ref 6–20)
Calcium, Ion: 1 mmol/L — ABNORMAL LOW (ref 1.15–1.40)
Calcium, Ion: 0.95 mmol/L — ABNORMAL LOW (ref 1.15–1.40)
Chloride: 93 mmol/L — ABNORMAL LOW (ref 98–111)
Chloride: 93 mmol/L — ABNORMAL LOW (ref 98–111)
Creatinine, Ser: 5.4 mg/dL — ABNORMAL HIGH (ref 0.61–1.24)
Creatinine, Ser: 5.1 mg/dL — ABNORMAL HIGH (ref 0.61–1.24)
Glucose, Bld: 78 mg/dL (ref 70–99)
Glucose, Bld: 82 mg/dL (ref 70–99)
HCT: 23 % — ABNORMAL LOW (ref 39.0–52.0)
HCT: 24 % — ABNORMAL LOW (ref 39.0–52.0)
Hemoglobin: 7.8 g/dL — ABNORMAL LOW (ref 13.0–17.0)
Hemoglobin: 8.2 g/dL — ABNORMAL LOW (ref 13.0–17.0)
Potassium: 4.9 mmol/L (ref 3.5–5.1)
Potassium: 5.1 mmol/L (ref 3.5–5.1)
Sodium: 131 mmol/L — ABNORMAL LOW (ref 135–145)
Sodium: 129 mmol/L — ABNORMAL LOW (ref 135–145)
TCO2: 25 mmol/L (ref 22–32)
TCO2: 25 mmol/L (ref 22–32)

## 2021-11-20 LAB — GLUCOSE, CAPILLARY
Glucose-Capillary: 75 mg/dL (ref 70–99)
Glucose-Capillary: 97 mg/dL (ref 70–99)

## 2021-11-20 LAB — CBC
HCT: 26.4 % — ABNORMAL LOW (ref 39.0–52.0)
Hemoglobin: 9.6 g/dL — ABNORMAL LOW (ref 13.0–17.0)
MCH: 29 pg (ref 26.0–34.0)
MCHC: 36.4 g/dL — ABNORMAL HIGH (ref 30.0–36.0)
MCV: 79.8 fL — ABNORMAL LOW (ref 80.0–100.0)
Platelets: 229 10*3/uL (ref 150–400)
RBC: 3.31 MIL/uL — ABNORMAL LOW (ref 4.22–5.81)
RDW: 17.5 % — ABNORMAL HIGH (ref 11.5–15.5)
WBC: 18 10*3/uL — ABNORMAL HIGH (ref 4.0–10.5)
nRBC: 0 % (ref 0.0–0.2)

## 2021-11-20 LAB — PREPARE RBC (CROSSMATCH)

## 2021-11-20 SURGERY — LAPAROTOMY, EXPLORATORY
Anesthesia: General | Site: Abdomen

## 2021-11-20 SURGERY — EGD (ESOPHAGOGASTRODUODENOSCOPY)
Anesthesia: Monitor Anesthesia Care

## 2021-11-20 MED ORDER — PROPOFOL 10 MG/ML IV BOLUS
INTRAVENOUS | Status: AC
  Filled 2021-11-20: qty 20

## 2021-11-20 MED ORDER — FENTANYL CITRATE (PF) 100 MCG/2ML IJ SOLN: 25.0000 ug | INTRAMUSCULAR | Status: DC | PRN

## 2021-11-20 MED ORDER — DEXAMETHASONE SODIUM PHOSPHATE 10 MG/ML IJ SOLN
INTRAMUSCULAR | Status: DC | PRN
  Administered 2021-11-20: 5 mg via INTRAVENOUS

## 2021-11-20 MED ORDER — EPHEDRINE 5 MG/ML INJ
INTRAVENOUS | Status: AC
  Filled 2021-11-20: qty 5

## 2021-11-20 MED ORDER — PANTOPRAZOLE SODIUM 40 MG IV SOLR
40.0000 mg | Freq: Two times a day (BID) | INTRAVENOUS | Status: DC
  Administered 2021-11-20 – 2021-11-24 (×9): 40 mg via INTRAVENOUS
  Filled 2021-11-20 (×3): qty 10
  Filled 2021-11-20 (×2): qty 40
  Filled 2021-11-20 (×4): qty 10
  Filled 2021-11-20: qty 40

## 2021-11-20 MED ORDER — CALCIUM CHLORIDE 10 % IV SOLN
INTRAVENOUS | Status: DC | PRN
  Administered 2021-11-20: 20 mg via INTRAVENOUS
  Administered 2021-11-20: 30 mg via INTRAVENOUS
  Administered 2021-11-20: 20 mg via INTRAVENOUS
  Administered 2021-11-20: 30 mg via INTRAVENOUS

## 2021-11-20 MED ORDER — FENTANYL CITRATE (PF) 250 MCG/5ML IJ SOLN
INTRAMUSCULAR | Status: AC
  Filled 2021-11-20: qty 5

## 2021-11-20 MED ORDER — EPHEDRINE SULFATE-NACL 50-0.9 MG/10ML-% IV SOSY
PREFILLED_SYRINGE | INTRAVENOUS | Status: DC | PRN
Start: 2021-11-20 — End: 2021-11-20
  Administered 2021-11-20: 10 mg via INTRAVENOUS

## 2021-11-20 MED ORDER — LIDOCAINE 2% (20 MG/ML) 5 ML SYRINGE
INTRAMUSCULAR | Status: DC | PRN
Start: 2021-11-20 — End: 2021-11-20
  Administered 2021-11-20: 60 mg via INTRAVENOUS

## 2021-11-20 MED ORDER — PROPOFOL 10 MG/ML IV BOLUS
INTRAVENOUS | Status: DC | PRN
  Administered 2021-11-20: 100 mg via INTRAVENOUS

## 2021-11-20 MED ORDER — CEFAZOLIN SODIUM-DEXTROSE 2-4 GM/100ML-% IV SOLN
2.0000 g | INTRAVENOUS | Status: AC
  Administered 2021-11-20: 2 g via INTRAVENOUS
  Filled 2021-11-20: qty 100

## 2021-11-20 MED ORDER — LIDOCAINE 2% (20 MG/ML) 5 ML SYRINGE
INTRAMUSCULAR | Status: AC
  Filled 2021-11-20: qty 5

## 2021-11-20 MED ORDER — PHENYLEPHRINE 40 MCG/ML (10ML) SYRINGE FOR IV PUSH (FOR BLOOD PRESSURE SUPPORT)
PREFILLED_SYRINGE | INTRAVENOUS | Status: DC | PRN
  Administered 2021-11-20 (×2): 80 ug via INTRAVENOUS
  Administered 2021-11-20: 120 ug via INTRAVENOUS
  Administered 2021-11-20: 80 ug via INTRAVENOUS
  Administered 2021-11-20: 120 ug via INTRAVENOUS

## 2021-11-20 MED ORDER — SODIUM CHLORIDE 0.9% IV SOLUTION: Freq: Once | INTRAVENOUS | Status: AC

## 2021-11-20 MED ORDER — MIDAZOLAM HCL 2 MG/2ML IJ SOLN
INTRAMUSCULAR | Status: AC
  Filled 2021-11-20: qty 2

## 2021-11-20 MED ORDER — SODIUM CHLORIDE 0.9 % IV SOLN: INTRAVENOUS | Status: DC

## 2021-11-20 MED ORDER — MIDAZOLAM HCL 2 MG/2ML IJ SOLN
INTRAMUSCULAR | Status: DC | PRN
Start: 2021-11-20 — End: 2021-11-20
  Administered 2021-11-20: 1 mg via INTRAVENOUS

## 2021-11-20 MED ORDER — ORAL CARE MOUTH RINSE: 15.0000 mL | Freq: Once | OROMUCOSAL | Status: AC

## 2021-11-20 MED ORDER — PROMETHAZINE HCL 25 MG/ML IJ SOLN: 6.2500 mg | INTRAMUSCULAR | Status: DC | PRN

## 2021-11-20 MED ORDER — ROCURONIUM BROMIDE 10 MG/ML (PF) SYRINGE
PREFILLED_SYRINGE | INTRAVENOUS | Status: DC | PRN
  Administered 2021-11-20: 20 mg via INTRAVENOUS
  Administered 2021-11-20: 40 mg via INTRAVENOUS

## 2021-11-20 MED ORDER — DEXAMETHASONE SODIUM PHOSPHATE 10 MG/ML IJ SOLN
INTRAMUSCULAR | Status: AC
  Filled 2021-11-20: qty 1

## 2021-11-20 MED ORDER — CHLORHEXIDINE GLUCONATE CLOTH 2 % EX PADS: 6.0000 | MEDICATED_PAD | Freq: Once | CUTANEOUS | Status: DC

## 2021-11-20 MED ORDER — SODIUM CHLORIDE 0.9% IV SOLUTION: Freq: Once | INTRAVENOUS | Status: DC

## 2021-11-20 MED ORDER — IOHEXOL 350 MG/ML SOLN
100.0000 mL | Freq: Once | INTRAVENOUS | Status: AC | PRN
  Administered 2021-11-20: 100 mL via INTRAVENOUS

## 2021-11-20 MED ORDER — 0.9 % SODIUM CHLORIDE (POUR BTL) OPTIME
TOPICAL | Status: DC | PRN
  Administered 2021-11-20: 4000 mL

## 2021-11-20 MED ORDER — ONDANSETRON HCL 4 MG/2ML IJ SOLN
INTRAMUSCULAR | Status: AC
  Filled 2021-11-20: qty 2

## 2021-11-20 MED ORDER — CHLORHEXIDINE GLUCONATE CLOTH 2 % EX PADS
6.0000 | MEDICATED_PAD | Freq: Every day | CUTANEOUS | Status: DC
  Administered 2021-11-20 – 2021-12-02 (×13): 6 via TOPICAL

## 2021-11-20 MED ORDER — HEMOSTATIC AGENTS (NO CHARGE) OPTIME
TOPICAL | Status: DC | PRN
Start: 2021-11-20 — End: 2021-11-20
  Administered 2021-11-20: 1 via TOPICAL

## 2021-11-20 MED ORDER — HYDROMORPHONE HCL 1 MG/ML IJ SOLN
0.5000 mg | INTRAMUSCULAR | Status: DC | PRN
  Administered 2021-11-20: 0.5 mg via INTRAVENOUS
  Administered 2021-11-20 – 2021-11-21 (×5): 1 mg via INTRAVENOUS
  Filled 2021-11-20 (×6): qty 1

## 2021-11-20 MED ORDER — SODIUM CHLORIDE 0.9 % IV SOLN: INTRAVENOUS | Status: DC | PRN

## 2021-11-20 MED ORDER — METRONIDAZOLE 500 MG/100ML IV SOLN
500.0000 mg | INTRAVENOUS | Status: AC
  Administered 2021-11-20: 500 mg via INTRAVENOUS
  Filled 2021-11-20: qty 100

## 2021-11-20 MED ORDER — ROCURONIUM BROMIDE 10 MG/ML (PF) SYRINGE
PREFILLED_SYRINGE | INTRAVENOUS | Status: AC
  Filled 2021-11-20: qty 10

## 2021-11-20 MED ORDER — FENTANYL CITRATE (PF) 250 MCG/5ML IJ SOLN
INTRAMUSCULAR | Status: DC | PRN
  Administered 2021-11-20 (×3): 50 ug via INTRAVENOUS

## 2021-11-20 MED ORDER — CHLORHEXIDINE GLUCONATE 0.12 % MT SOLN
15.0000 mL | Freq: Once | OROMUCOSAL | Status: AC
  Administered 2021-11-20: 15 mL via OROMUCOSAL
  Filled 2021-11-20: qty 15

## 2021-11-20 MED ORDER — FENTANYL CITRATE (PF) 100 MCG/2ML IJ SOLN
12.5000 ug | INTRAMUSCULAR | Status: DC | PRN
  Administered 2021-11-20: 12.5 ug via INTRAVENOUS
  Filled 2021-11-20: qty 2

## 2021-11-20 MED ORDER — ONDANSETRON HCL 4 MG/2ML IJ SOLN
INTRAMUSCULAR | Status: DC | PRN
  Administered 2021-11-20: 4 mg via INTRAVENOUS

## 2021-11-20 MED ORDER — SUGAMMADEX SODIUM 200 MG/2ML IV SOLN
INTRAVENOUS | Status: DC | PRN
Start: 2021-11-20 — End: 2021-11-20
  Administered 2021-11-20: 200 mg via INTRAVENOUS

## 2021-11-20 MED ORDER — PHENYLEPHRINE 40 MCG/ML (10ML) SYRINGE FOR IV PUSH (FOR BLOOD PRESSURE SUPPORT)
PREFILLED_SYRINGE | INTRAVENOUS | Status: AC
  Filled 2021-11-20: qty 20

## 2021-11-20 MED ORDER — PHENYLEPHRINE HCL-NACL 20-0.9 MG/250ML-% IV SOLN
INTRAVENOUS | Status: DC | PRN
  Administered 2021-11-20: 50 ug/min via INTRAVENOUS

## 2021-11-20 SURGICAL SUPPLY — 58 items
BAG COUNTER SPONGE SURGICOUNT (BAG) ×2 IMPLANT
BIOPATCH RED 1 DISK 7.0 (GAUZE/BANDAGES/DRESSINGS) ×1 IMPLANT
BLADE CLIPPER SURG (BLADE) IMPLANT
CANISTER SUCT 3000ML PPV (MISCELLANEOUS) ×2 IMPLANT
CHLORAPREP W/TINT 26 (MISCELLANEOUS) ×2 IMPLANT
COVER SURGICAL LIGHT HANDLE (MISCELLANEOUS) ×2 IMPLANT
DERMABOND ADVANCED (GAUZE/BANDAGES/DRESSINGS)
DERMABOND ADVANCED .7 DNX12 (GAUZE/BANDAGES/DRESSINGS) ×2 IMPLANT
DRAIN CHANNEL 19F RND (DRAIN) ×1 IMPLANT
DRAPE INCISE IOBAN 66X45 STRL (DRAPES) ×2 IMPLANT
DRAPE LAPAROSCOPIC ABDOMINAL (DRAPES) ×2 IMPLANT
DRAPE WARM FLUID 44X44 (DRAPES) ×2 IMPLANT
DRSG OPSITE POSTOP 4X10 (GAUZE/BANDAGES/DRESSINGS) IMPLANT
DRSG OPSITE POSTOP 4X8 (GAUZE/BANDAGES/DRESSINGS) ×1 IMPLANT
DRSG TEGADERM 4X4.75 (GAUZE/BANDAGES/DRESSINGS) ×1 IMPLANT
ELECT BLADE 6.5 EXT (BLADE) ×1 IMPLANT
ELECT CAUTERY BLADE 6.4 (BLADE) ×2 IMPLANT
ELECT PAD DSPR THERM+ ADLT (MISCELLANEOUS) ×1 IMPLANT
ELECT REM PT RETURN 9FT ADLT (ELECTROSURGICAL) ×2
ELECTRODE REM PT RTRN 9FT ADLT (ELECTROSURGICAL) ×1 IMPLANT
EVACUATOR SILICONE 100CC (DRAIN) ×1 IMPLANT
GAUZE SPONGE 2X2 8PLY STRL LF (GAUZE/BANDAGES/DRESSINGS) IMPLANT
GLOVE SURG ENC MOIS LTX SZ7.5 (GLOVE) ×2 IMPLANT
GLOVE SURG GAMMEX LF SZ7.5 (GLOVE) ×1 IMPLANT
GLOVE SURG POLY MICRO LF SZ5.5 (GLOVE) ×2 IMPLANT
GLOVE SURG UNDER POLY LF SZ6 (GLOVE) ×2 IMPLANT
GOWN STRL REIN XL XLG (GOWN DISPOSABLE) ×1 IMPLANT
GOWN STRL REUS W/ TWL LRG LVL3 (GOWN DISPOSABLE) ×2 IMPLANT
GOWN STRL REUS W/TWL LRG LVL3 (GOWN DISPOSABLE) ×2
HAND PENCIL TRP OPTION (MISCELLANEOUS) ×1 IMPLANT
HANDLE SUCTION POOLE (INSTRUMENTS) ×1 IMPLANT
KIT BASIN OR (CUSTOM PROCEDURE TRAY) ×2 IMPLANT
KIT TURNOVER KIT B (KITS) ×2 IMPLANT
LIGASURE IMPACT 36 18CM CVD LR (INSTRUMENTS) ×1 IMPLANT
NS IRRIG 1000ML POUR BTL (IV SOLUTION) ×4 IMPLANT
PACK GENERAL/GYN (CUSTOM PROCEDURE TRAY) ×2 IMPLANT
PAD ARMBOARD 7.5X6 YLW CONV (MISCELLANEOUS) ×2 IMPLANT
PENCIL SMOKE EVACUATOR (MISCELLANEOUS) ×1 IMPLANT
SEALANT SURG COSEAL 8ML (VASCULAR PRODUCTS) ×1 IMPLANT
SLEEVE SUCTION CATH 165 (SLEEVE) ×1 IMPLANT
SPECIMEN JAR LARGE (MISCELLANEOUS) IMPLANT
SPONGE GAUZE 2X2 STER 10/PKG (GAUZE/BANDAGES/DRESSINGS) ×1
SPONGE T-LAP 18X18 ~~LOC~~+RFID (SPONGE) ×5 IMPLANT
STAPLER VISISTAT 35W (STAPLE) ×1 IMPLANT
SUCTION POOLE HANDLE (INSTRUMENTS) ×2
SUT ETHILON 2 0 FS 18 (SUTURE) ×1 IMPLANT
SUT MNCRL AB 4-0 PS2 18 (SUTURE) ×2 IMPLANT
SUT PDS AB 1 TP1 96 (SUTURE) ×4 IMPLANT
SUT SILK 2 0 SH CR/8 (SUTURE) ×2 IMPLANT
SUT SILK 2 0 TIES 10X30 (SUTURE) ×2 IMPLANT
SUT SILK 3 0 SH CR/8 (SUTURE) ×3 IMPLANT
SUT SILK 3 0 TIES 10X30 (SUTURE) ×2 IMPLANT
SUT VIC AB 3-0 SH 18 (SUTURE) ×3 IMPLANT
SUT VIC AB 3-0 SH 27 (SUTURE) ×2
SUT VIC AB 3-0 SH 27XBRD (SUTURE) ×2 IMPLANT
TOWEL GREEN STERILE (TOWEL DISPOSABLE) ×2 IMPLANT
TRAY FOLEY MTR SLVR 16FR STAT (SET/KITS/TRAYS/PACK) IMPLANT
YANKAUER SUCT BULB TIP NO VENT (SUCTIONS) IMPLANT

## 2021-11-20 NOTE — Op Note (Signed)
Date: 11/20/21  Patient: Timothy [name] MRN: [id number]  Preoperative Diagnosis: Duodenal ulcer with bleeding Postoperative Diagnosis: Same  Procedure: Exploratory laparotomy, lysis of adhesions, transduodenal suture ligation of duodenal ulcer  Surgeon: Michaelle Birks, MD Assistant: Nadeen Landau, MD  EBL: 200 mL  Anesthesia: General endotracheal  Specimens: None  Indications: Mr. Munn is a 61 year old male with multiple medical comorbidities who has had recurrent GI bleeding from an ulcer in the duodenal bulb.  He has undergone multiple endoscopic interventions by GI, as well as 2 embolization procedures by interventional radiology.  He has had recurrent bleeding per rectum overnight with a drop in his hemoglobin by two points to 5.2.  As he has failed multiple minimally invasive interventions, I recommended that he undergo surgical intervention.  Findings: Dense intra-abdominal adhesions with venous collateralization in the upper abdomen.  The liver did not appear cirrhotic.  Cratered ulcer on the posterior wall of the duodenal bulb with minimal oozing noted but no arterial bleeding.  Previously placed coil was visible in the base of the ulcer.  A suture ligation was performed, as well as argon plasma coagulation.   Procedure details: Informed consent was obtained in the preoperative area prior to the procedure. The patient was brought to the operating room and placed on the table in the supine position. General anesthesia was induced and appropriate lines and drains were placed for intraoperative monitoring. Perioperative antibiotics were administered per SCIP guidelines. The abdomen was prepped and draped in the usual sterile fashion. A pre-procedure timeout was taken verifying patient identity, surgical site and procedure to be performed.  An upper midline skin incision was made and the subcutaneous tissue was divided with cautery.  The fascia was opened along the linea alba.   Peritoneum was elevated and opened sharply.  Moderate nonbloody ascites was encountered on entry into the abdomen.  There were dense, thick adhesions from the patient's previous surgeries.  The colon was densely adherent to the anterior abdominal wall just above the umbilicus.  The colon was sharply taken down off the abdominal wall.  The colon was also somewhat adherent to the stomach.  The gastrocolic ligament was thickened with some venous collaterals.  This was opened using LigaSure to maintain hemostasis. The colon was adherent to the duodenum, and was carefully taken off the surface of the duodenum and pancreas using blunt dissection.  The hepatic flexure of the colon was mobilized, using the leg the LigaSure to ligate any visible vessels.  The duodenum was then kocherized. The pylorus was identified and a longitudinal duodenotomy was made on the first portion of the duodenum. A clean, cratered ulcer was visualized on the posterior duodenal bulb. The edges were friable with some oozing, but there was no arterial bleeding. A previously placed embolization coil was partially visualized in the base of the ulcer. Four 3-0 silk suture ligatures were placed through the base of the ulcer to prevent further bleeding from the GDA branches. Argon plasma coagulation was performed on the edges of the ulcer and Vistaseal was then placed. The area appeared hemostatic. The duodenotomy was then closed obliquely in two layers, with an inner layer of interrupted 3-0 Vicryl sutures and an outer layer of 3-0 silk Lembert sutures. At the completion of this closure, the lumen of the duodenum was palpated and was patent. There was not enough omentum to mobilize to place over the duodenal repair. The falciform ligament was mobilized but there was not enough viable tissue to place over the repair.  The abdomen was irrigated with warmed saline and appeared hemostatic. A 19-Fr JP drain was placed adjacent to the duodenal closure and  brought out through the RUQ abdominal wall. It was secured to the skin with 2-0 Nylon suture. The fascia was closed at midline with a running looped 1 PDS suture and the skin was closed with staples. A sterile dressing was placed.  The patient tolerated the procedure with no apparent complications. All counts were correct x2 at the end of the procedure. The patient was extubated and taken to PACU in stable condition.  Michaelle Birks, MD 11/20/21 12:20 PM

## 2021-11-20 NOTE — Progress Notes (Signed)
Date and time results received: 11/20/21 741 (use smartphrase ".now" to insert current time)  Test: hemoglobin Critical Value: 5.2  Name of Provider Notified: Dr. Candiss Norse  Orders Received? Or Actions Taken?: Orders Received - See Orders for details

## 2021-11-20 NOTE — Progress Notes (Signed)
Pt in OR .

## 2021-11-20 NOTE — Progress Notes (Addendum)
NAME:  Timothy [name], MRN:  [id number], DOB:  1961-01-18, LOS: 8 ADMISSION DATE:  11/12/2021 CONSULTATION DATE:  11/15/2021 REFERRING MD:  [place] Crane - [facility] CHIEF COMPLAINT:  GIB, hypotension   History of Present Illness:  61 year old man who presented to [facility] ED 1/28 via EMS after being found down on the bathroom floor with rectal bleeding.  EMS was called and patient was transferred to [facility] ED. Found to have GI bleed with associated symptomatic anemia (Hgb 5.1).  Transfused 3 units PRBCs.  EGD 1/30 demonstrated duodenal ulcer with overlying adherent clot but no signs of active bleeding.  On 1/31, patient had large volume melena with clots and associated Hgb drop to 6.6.  He became relatively hypotensive with SBPs 80s and mild tachycardia to low 100s.  Received additional 2 units PRBCs.  PCCM was consulted for possible ICU admission/pressor initiation.  Pertinent Medical History:   Past Medical History:  Diagnosis Date   Anemia of chronic kidney failure    BPH (benign prostatic hyperplasia)    Colon cancer [facility]) 2014   spouse states he had surgury for colon CA   End stage renal disease on dialysis ([place]) 2017   Hypertension    Hypertensive heart disease with chronic diastolic congestive heart failure ([place]) 07/17/2016   Polysubstance abuse ([place])    History of heroin and marijuana use   Significant Hospital Events: Including procedures, antibiotic start and stop dates in addition to other pertinent events   1/28 - Presented to [facility] via EMS for rectal bleeding, found down at home. Admitted to [facility]. 1/30 - EGD demonstrated duodenal ulcer with overlying adherent clot but no active bleeding. GI following. 1/31 - Large volume melena with clots, mild tachycardia and hypotension with SBPs 80s. Hgb 6.6, 2U PRBCs ordered. PCCM consulted for possible ICU admission. 2/1 - EGD with spurting duodenal ulcer. IR consulted. Patient underwent gel foam embo of the anterior and posterior  pancreatico-duodenal branches.  2/2 - A. Fib with RVR. Self-converted to sinus tachycardia  2/4 hgb drop  2/5 hgb dropped to 5, receiving PRBC. Plan to transfer back to ICU and plan for OR for ex lap & investigation of bleeding duodenal ulcer   Interim History / Subjective:   Receiving PRBC for hgb 5.2   Objective:  Blood pressure 131/65, pulse 87, temperature 98.2 F (36.8 C), temperature source Axillary, resp. rate 18, height 6\' 1"  (1.854 m), weight 70.7 kg, SpO2 100 %.        Intake/Output Summary (Last 24 hours) at 11/20/2021 0754 Last data filed at 11/20/2021 0330 Gross per 24 hour  Intake 1971.17 ml  Output --  Net 1971.17 ml   Filed Weights   11/18/21 0102 11/18/21 1045 11/19/21 0439  Weight: 72.3 kg (P) 70.1 kg 70.7 kg   Physical Examination: General: Chronically ill appearing older adult M seated in bed NAD  HEENT: NCAT pink mm trachea midline Neuro:  AAOx3, following commands no focal deficit  CV: rr V2Z3 systolic murmur.  PULM: even unlabored on RA, CTA GI: soft ndnt + bowel ounds  Extremities: no acute deformity no cyanosis or clubbing. LUE AVF + thrill  Skin:c/d/w   Resolved Hospital Problem List:  Hypotension  Assessment & Plan:   Bleeding duodenal ulcer ABLA due to above  -s/p multiple EGDs, IR embo  -unfortunately has continued to have bloody Bms & worsening ABLA despite minimally invasive attempts to achieve hemostasis + supportive PRBC as needed -fortunately is HDS, but ongoing bleed + drop in  hgb quite concerning  P -will transfer back to ICU for close monitoring -IV team consult for larger bore PIV if able  -Plan for ex lap with CCS 2/5  -post op cbc  -PRBC for CBC >7   ESRD on HD -HD per nephro, on TTSa schedule   Afib, now sinus  HTN -would be poor candidate for [facility] due to GIB -eval antihypertensive reg post op.   Chronic pain -oxycodone   Hx colon cancer -outpt follow up     Best Practice: (right click and "Reselect all SmartList  Selections" daily)   Diet/type: NPO DVT prophylaxis: SCD GI prophylaxis: PPI Lines: N/A Foley:  N/A Code Status:  full code Last date of multidisciplinary goals of care discussion: N/A  Labs:  CBC: Recent Labs  Lab 11/18/21 0030 11/18/21 1621 11/19/21 0452 11/19/21 1619 11/19/21 2245 11/20/21 0658  WBC 24.6* 25.2* 21.1* 19.6* 15.6* 13.6*  NEUTROABS 20.6* 23.9* 17.7* 16.0*  --  10.8*  HGB 7.4* 8.1* 6.8* 6.8* 7.3* 5.2*  HCT 21.6* 24.2* 20.5* 20.0* 22.2* 15.7*  MCV 82.4 84.3 85.8 85.8 85.4 85.3  PLT 236 229 199 228 222 366   Basic Metabolic Panel: Recent Labs  Lab 11/15/21 0233 11/16/21 1148 11/16/21 2233 11/17/21 0603 11/18/21 0151 11/19/21 0452  NA 130* 131* 126* 132* 130* 130*  K 4.0 5.2* 6.6* 4.6 5.1 4.4  CL 95* 98 92* 96* 94* 95*  CO2 22 20*  --  24 23 25   GLUCOSE 101* 96 90 94 92 71  BUN 21* 47* 67* 37* 49* 39*  CREATININE 3.03* 3.92* 4.70* 3.08* 4.10* 4.12*  CALCIUM 7.9* 6.8*  --  7.9* 7.6* 7.6*  PHOS 3.3 5.8*  --  4.4 5.2* 5.0*   GFR: Estimated Creatinine Clearance: 19.1 mL/min (A) (by C-G formula based on SCr of 4.12 mg/dL (H)). Recent Labs  Lab 11/19/21 0452 11/19/21 1619 11/19/21 2245 11/20/21 0658  WBC 21.1* 19.6* 15.6* 13.6*   Liver Function Tests: Recent Labs  Lab 11/15/21 0233 11/16/21 1148 11/17/21 0603 11/18/21 0151 11/19/21 0452  ALBUMIN 2.1* 1.7* 2.1* 1.9* 1.8*   No results for input(s): LIPASE, AMYLASE in the last 168 hours. No results for input(s): AMMONIA in the last 168 hours.  ABG:    Component Value Date/Time   PHART 7.394 11/05/2021 1043   PCO2ART 44.4 11/05/2021 1043   PO2ART 82 (L) 11/05/2021 1043   HCO3 27.1 11/05/2021 1043   TCO2 24 11/16/2021 2233   ACIDBASEDEF 4.0 (H) 09/09/2016 0931   O2SAT 96.0 11/05/2021 1043     Coagulation Profile: Recent Labs  Lab 11/16/21 1148  INR 1.8*   Cardiac Enzymes: No results for input(s): CKTOTAL, CKMB, CKMBINDEX, TROPONINI in the last 168 hours.  HbA1C: No results  found for: HGBA1C  CBG: Recent Labs  Lab 11/15/21 1905 11/16/21 1921 11/16/21 2344 11/17/21 0347 11/17/21 0743  GLUCAP 89 94 86 86 92   CRITICAL CARE Performed by: Cristal Generous  N/a   Eliseo Gum MSN, AGACNP-BC [place] for pager  11/20/2021, 9:13 AM

## 2021-11-20 NOTE — Progress Notes (Signed)
4 Days Post-Op  Subjective: Surgery reconsulted today for ongoing bleeding per rectum. He has been having bloody bowel movements overnight and hgb is now 5.2. He remains hemodynamically stable and is currently being transfused PRBCs. He was dialyzed yesterday.   Objective: Vital signs in last 24 hours: Temp:  [97.7 F (36.5 C)-98.9 F (37.2 C)] 98 F (36.7 C) (02/05 0816) Pulse Rate:  [87-100] 88 (02/05 0816) Resp:  [18-19] 19 (02/05 0816) BP: (119-182)/(59-86) 182/59 (02/05 0816) SpO2:  [95 %-100 %] 99 % (02/05 0816) Last BM Date: 11/18/21  Intake/Output from previous day: 02/04 0701 - 02/05 0700 In: 1971.2 [P.O.:1035; I.V.:262.2; Blood:368] Out: -  Intake/Output this shift: No intake/output data recorded.  PE: General: resting comfortably, NAD Neuro: alert and oriented, no focal deficits Resp: normal work of breathing on room air Abdomen: soft, nondistended, nontender to palpation. Well-healed midline laparotomy scar. Extremities: warm and well-perfused   Lab Results:  Recent Labs    11/19/21 2245 11/20/21 0658  WBC 15.6* 13.6*  HGB 7.3* 5.2*  HCT 22.2* 15.7*  PLT 222 174   BMET Recent Labs    11/18/21 0151 11/19/21 0452  NA 130* 130*  K 5.1 4.4  CL 94* 95*  CO2 23 25  GLUCOSE 92 71  BUN 49* 39*  CREATININE 4.10* 4.12*  CALCIUM 7.6* 7.6*   PT/INR No results for input(s): LABPROT, INR in the last 72 hours. CMP     Component Value Date/Time   NA 130 (L) 11/19/2021 0452   K 4.4 11/19/2021 0452   CL 95 (L) 11/19/2021 0452   CO2 25 11/19/2021 0452   GLUCOSE 71 11/19/2021 0452   BUN 39 (H) 11/19/2021 0452   CREATININE 4.12 (H) 11/19/2021 0452   CREATININE 1.59 (H) 07/29/2015 0943   CALCIUM 7.6 (L) 11/19/2021 0452   CALCIUM 8.4 (L) 09/14/2016 1615   PROT 5.6 (L) 11/12/2021 1501   ALBUMIN 1.8 (L) 11/19/2021 0452   AST 22 11/12/2021 1501   ALT 27 11/12/2021 1501   ALKPHOS 55 11/12/2021 1501   BILITOT 0.5 11/12/2021 1501   GFRNONAA 16 (L)  11/19/2021 0452   GFRNONAA 48 (L) 07/29/2015 0943   GFRAA 7 (L) 05/18/2019 2220   GFRAA 56 (L) 07/29/2015 0943   Lipase     Component Value Date/Time   LIPASE 50 06/11/2021 1427       Studies/Results: CT ANGIO GI BLEED  Result Date: 11/20/2021 CLINICAL DATA:  Upper GI bleed common non variceal. Arterial source on endoscopy. EXAM: CTA ABDOMEN AND PELVIS WITHOUT AND WITH CONTRAST TECHNIQUE: Multidetector CT imaging of the abdomen and pelvis was performed using the standard protocol during bolus administration of intravenous contrast. Multiplanar reconstructed images and MIPs were obtained and reviewed to evaluate the vascular anatomy. RADIATION DOSE REDUCTION: This exam was performed according to the departmental dose-optimization program which includes automated exposure control, adjustment of the mA and/or kV according to patient size and/or use of iterative reconstruction technique. CONTRAST:  117mL OMNIPAQUE IOHEXOL 350 MG/ML SOLN COMPARISON:  11/15/2021, 06/21/2021. FINDINGS: VASCULAR Aorta: Aortic atherosclerosis. Normal caliber aorta without aneurysm, dissection, vasculitis or significant stenosis. Celiac: Patent without evidence of aneurysm, dissection, vasculitis or significant stenosis. Metallic coils are present in the region of the gastroduodenal artery. SMA: Patent without evidence of aneurysm, dissection, vasculitis or significant stenosis. Renals: Mild atherosclerotic calcification. Both renal arteries are patent without evidence of aneurysm, dissection, vasculitis, fibromuscular dysplasia or significant stenosis. IMA: Patent without evidence of aneurysm, dissection, vasculitis or significant stenosis.  Inflow: Atherosclerotic calcifications. Patent without evidence of aneurysm, dissection, vasculitis or significant stenosis. Proximal Outflow: Atherosclerotic calcifications. Bilateral common femoral and visualized portions of the superficial and profunda femoral arteries are patent  without evidence of aneurysm, dissection, vasculitis or significant stenosis. Veins: No obvious venous abnormality within the limitations of this arterial phase study. Review of the MIP images confirms the above findings. NON-VASCULAR Lower chest: The heart is enlarged and there is a moderate pericardial effusion measuring up to 1.7 cm. Strandy atelectasis or infiltrate is present at the lung bases. There is a trace left pleural effusion. Hepatobiliary: No focal liver abnormality. A stone is present within the gallbladder. The gallbladder is contracted and gallbladder wall thickening is noted. No biliary ductal dilatation. Pancreas: There is mild dilatation of the pancreatic duct measuring 4 mm, not significantly changed from the prior exam. No local inflammatory changes. Spleen: The spleen is normal in size. There is a stable hypodensity in the spleen measuring 2 cm. Adrenals/Urinary Tract: The adrenal glands are within normal limits. Bilateral renal atrophy is noted. There is a complex hypodense mass in the right kidney measuring 2.9 cm. Stable subcentimeter hypodensities are noted in the kidneys bilaterally. No hydronephrosis. Excreted contrast is present in the urinary bladder. Stomach/Bowel: The stomach is partially distended. Evaluation for hemorrhage is limited in the presence of oral contrast. No bowel obstruction, free air, or pneumatosis. Right hemicolectomy changes are present. Lymphatic: Evaluation for lymphadenopathy is limited due to ascites. Reproductive: Prostate is unremarkable. Other: Moderate ascites is noted. Musculoskeletal: No acute osseous abnormality. IMPRESSION: VASCULAR 1. No evidence of active GI hemorrhage. 2. Coil embolization of the gastroduodenal artery. 3. Aortic atherosclerosis with no significant stenosis or aneurysm. NON-VASCULAR 1. Stable moderate ascites. 2. Cardiomegaly with moderate pericardial effusion. 3. Cholelithiasis. 4. Strandy atelectasis or infiltrate at the lung bases.  Small left pleural effusion. 5. Bilateral renal atrophy with multiple stable hypodensities. There is a complex lesion in the mid right kidney, stable from prior exams and previously characterized as a hemorrhagic or proteinaceous cyst on MRI. Electronically Signed   By: Brett Fairy M.D.   On: 11/20/2021 04:39    Anti-infectives: Anti-infectives (From admission, onward)    None        Assessment/Plan This is a 61 yo male with recurrent GI bleeding secondary to a duodenal ulcer. He has multiple medical comorbidities including ESRD, a-fib and chronic systolic heart failure and was admitted last month with volume overload. He has now had 3 EGDs in the last two weeks, and hemostatic agents were applied during two of those procedures, and he has continued to bleed. He has also undergone two IR embolization procedures to embolize the GDA and the pancreaticoduodenal arcades. He has had numerous radiographic and endoscopic studies localizing his bleeding to an ulcer in the duodenal bulb, and since he has failed multiple minimally invasive attempts to control bleeding, I think surgical intervention is warranted at this point. I discussed with Dr. Lorenso Courier of GI, who agreed that further endoscopic interventions are not likely to be successful at this point. I discussed this with the patient and recommended proceeding with exploratory laparotomy, duodenotomy and oversewing of the ulcer. I discussed he is at higher risk of perioperative complications given his comorbidities. He agrees to proceed with surgery. PCCM is consulted and the patient will be transferred to the ICU postoperatively.    LOS: 8 days    Michaelle Birks, MD [facility] General, Hepatobiliary and Pancreatic Surgery 11/20/21 8:29 AM

## 2021-11-20 NOTE — Progress Notes (Signed)
PT Cancellation Note  Patient Details Name: Timothy [name] MRN: [id number] DOB: 10-Aug-1961   Cancelled Treatment:    Reason Eval/Treat Not Completed: Patient at procedure or test/unavailable Patient off unit in OR. PT will re-attempt as time allows.   Jemima Petko A. Gilford Rile PT, DPT Acute Rehabilitation Services Pager [phone] Office [phone]    Linna Hoff 11/20/2021, 8:44 AM

## 2021-11-20 NOTE — Anesthesia Procedure Notes (Signed)
Procedure Name: Intubation Date/Time: 11/20/2021 9:57 AM Performed by: Janace Litten, CRNA Pre-anesthesia Checklist: Patient identified, Emergency Drugs available, Suction available and Patient being monitored Patient Re-evaluated:Patient Re-evaluated prior to induction Oxygen Delivery Method: Circle System Utilized Preoxygenation: Pre-oxygenation with 100% oxygen Induction Type: IV induction Ventilation: Mask ventilation without difficulty Laryngoscope Size: Mac and 4 Grade View: Grade I Tube type: Oral Tube size: 7.5 mm Number of attempts: 1 Airway Equipment and Method: Stylet and Oral airway Placement Confirmation: ETT inserted through vocal cords under direct vision, positive ETCO2 and breath sounds checked- equal and bilateral Secured at: 22 cm Tube secured with: Tape Dental Injury: Teeth and Oropharynx as per pre-operative assessment  Difficulty Due To: Difficulty was unanticipated

## 2021-11-20 NOTE — Therapy (Signed)
OT Cancellation Note  Patient Details Name: Timothy [name] MRN: [id number] DOB: 1960/12/20   Cancelled Treatment:      Currently in a procedure.   Naomie Dean Jacory Kamel 11/20/2021, 10:15 AM

## 2021-11-20 NOTE — Significant Event (Addendum)
Patient was noticed to have a large bloody bowel movement.  On exam at bedside patient is not distressed blood pressure systolic is 494/47 pulse 395/KGY.  We will transfuse 1 unit PRBC immediately and will notify GI.  I have paged Dr. Lorenso Courier who is on-call for [facility].  We will also get a CT angiogram of the abdomen.  Dr. Lorenso Courier, gastroenterologist is planning to take patient to EGD today again.  Gean Birchwood

## 2021-11-20 NOTE — Progress Notes (Signed)
Pt in OR, IV Team consult cancelled.  RN aware.

## 2021-11-20 NOTE — Progress Notes (Signed)
PROGRESS NOTE                                                                                                                                                                                                             Patient Demographics:    Timothy [name], is a 61 y.o. male, DOB - [birth year], CWC:[id number]  Outpatient Primary MD for the patient is Osei-Bonsu, Iona Beard, MD    LOS - 8  Admit date - 11/12/2021    Chief Complaint  Patient presents with   Rectal Bleeding       Brief Narrative (HPI from H&P)   61 year old gentleman with history of ESRD TTS schedule, GERD with duodenal ulcer, hypertension, chronic pain on oxycodone who was brought in by EMS after he was found to have large rectal bleeding with acute blood loss related anemia, he was found to have a hemoglobin of 5.1 in the ER and admitted to ICU.  He required multiple units of packed RBC transfusion, he was seen by GI underwent EGD on 11/16/2021 showing a duodenal ulcer with spurting blood and adherent clot, IR was consulted and he subsequently underwent embolization on 11/16/2021.  H&H stabilized somewhat and he was transferred to hospitalist service on 11/19/2021.  This morning his H&H has dropped a unit again however no signs of active ongoing bleeding will be monitored closely.   Subjective:   Patient in bed, appears comfortable, denies any headache, no fever, no chest pain or pressure, no shortness of breath , no abdominal pain. No new focal weakness.  Had a large bloody bowel movement night of 11/19/2021.    Assessment  & Plan :    Assessment and Plan: No notes have been filed under this hospital service. Service: Hospitalist   Acute blood loss related anemia due to upper GI acute duodenal bleed - he has received multiple units of packed RBCs in ICU, as well as getting 3 units of packed RBC between 11/19/2021 and 11/20/2018.  He is s/p EGD showing actively bleeding  duodenal ulcer he is s/p embolization by IR on 11/16/2021.  Unfortunately night of 11/19/2021 he had a massive bloody bowel movement after which his H&H dropped again, general surgery was consulted and he is being emergently taken to the OR for exploratory lap and probably suturing of his duodenal ulcer bleed, thereafter he will be  transferred to ICU Case discussed with general surgery, ICU and nephrology.  2.  ESRD.  On TTS schedule.  He is due for HD nephrology team has been informed on 11/20/2021  3.  Paroxysmal A. fib RVR.  This is new finding in the setting of severe anemia and acute illness.  He was briefly on diltiazem drip.  He is since converted to sinus rhythm in ICU and has been transition out to my service in sinus.  His [place] vas 2 score will be greater than 2.  However at this time extremely poor candidate for anticoagulation due to #1 above.  If remains stable will have him follow-up with cardiology outpatient within 1 to 2 weeks of discharge.  Currently stable in sinus rhythm.  4.  HTN.  Blood pressure is stabilizing we will place him on Lopressor and monitor.  5.  Past history of colon cancer.  Follow with PCP and primary GI post discharge.  6.  Chronic pain and remote history of substance abuse.  Monitor with supportive care.      Condition - Extremely Guarded  Family Communication  :  None present  Code Status :  Full  Consults  :  Renal, GI, IR, PCCM, CCS  PUD Prophylaxis : PPI   Procedures  :     IR - Angiogram 11/16/21 - 1. No evidence of active extravasation about the duodenum, however multifocal vessel irregularity was observed. 2. Technically successful Gel-Foam slurry embolization of the anterior and posterior pancreaticoduodenal arcades.      Disposition Plan  :    Status is: Inpatient - GI Bleed   DVT Prophylaxis  :    SCD's Start: 11/20/21 8315 Place TED hose Start: 11/19/21 0900 SCDs Start: 11/12/21 2005   Lab Results  Component Value Date   PLT 174  11/20/2021    Diet :  Diet Order             Diet NPO time specified  Diet effective now                    Inpatient Medications  Scheduled Meds:  [MAR Hold] (feeding supplement) PROSource Plus  30 mL Oral BID BM   [MAR Hold] sodium chloride   Intravenous Once   [MAR Hold] sodium chloride   Intravenous Once   Chlorhexidine Gluconate Cloth  6 each Topical Once   [MAR Hold] doxercalciferol  2 mcg Intravenous Q T,Th,Sa-HD   [MAR Hold] metoprolol tartrate  25 mg Oral BID   [MAR Hold] pantoprazole  40 mg Oral BID   [MAR Hold] sevelamer carbonate  1,600 mg Oral TID with meals   Continuous Infusions:  [MAR Hold] sodium chloride 10 mL/hr (11/16/21 1200)   [MAR Hold] sodium chloride     sodium chloride 10 mL/hr at 11/20/21 0935    ceFAZolin (ANCEF) IV     And   metronidazole     PRN Meds:.0.9 % irrigation (POUR BTL), [MAR Hold] fentaNYL (SUBLIMAZE) injection, [MAR Hold] hydrALAZINE, [MAR Hold] ipratropium-albuterol, [MAR Hold] oxyCODONE  Antibiotics  :    Anti-infectives (From admission, onward)    Start     Dose/Rate Route Frequency Ordered Stop   11/20/21 0930  ceFAZolin (ANCEF) IVPB 2g/100 mL premix       See Hyperspace for full Linked Orders Report.   2 g 200 mL/hr over 30 Minutes Intravenous On call to O.R. 11/20/21 0831 11/21/21 0559   11/20/21 0930  metroNIDAZOLE (FLAGYL) IVPB 500 mg  See Hyperspace for full Linked Orders Report.   500 mg 100 mL/hr over 60 Minutes Intravenous On call to O.R. 11/20/21 0831 11/21/21 0559        Time Spent in minutes  30   Lala Lund M.D on 11/20/2021 at 9:58 AM  To page go to [email or web address]   [facility] -  Office  [phone]  See all Orders from today for further details    Objective:   Vitals:   11/20/21 0330 11/20/21 0816 11/20/21 0853 11/20/21 0857  BP: 131/65 (!) 182/59 (!) 175/70   Pulse: 87 88 85   Resp: 18 19 18    Temp: 98.2 F (36.8 C) 98 F (36.7 C) 98.4 F (36.9 C)   TempSrc:  Axillary Oral Oral   SpO2: 100% 99% 98%   Weight:    70.7 kg  Height:    6\' 1"  (1.854 m)    Wt Readings from Last 3 Encounters:  11/20/21 70.7 kg  11/08/21 71.5 kg  11/02/21 66.8 kg     Intake/Output Summary (Last 24 hours) at 11/20/2021 0958 Last data filed at 11/20/2021 0330 Gross per 24 hour  Intake 1616.17 ml  Output --  Net 1616.17 ml     Physical Exam  Awake Alert, No new F.N deficits, Normal affect [place].AT,PERRAL Supple Neck, No JVD,   Symmetrical Chest wall movement, Good air movement bilaterally, CTAB RRR,No Gallops, Rubs or new Murmurs,  +ve B.Sounds, Abd Soft, No tenderness,   No Cyanosis, Clubbing or edema   RN pressure injury documentation: Pressure Injury 11/12/21 Coccyx Right;Upper Stage 2 -  Partial thickness loss of dermis presenting as a shallow open injury with a red, pink wound bed without slough. oval shaped opening with smooth edges and pink/red center (Active)  11/12/21 2100  Location: Coccyx  Location Orientation: Right;Upper  Staging: Stage 2 -  Partial thickness loss of dermis presenting as a shallow open injury with a red, pink wound bed without slough.  Wound Description (Comments): oval shaped opening with smooth edges and pink/red center  Present on Admission: Yes     Data Review:    CBC Recent Labs  Lab 11/18/21 0030 11/18/21 1621 11/19/21 0452 11/19/21 1619 11/19/21 2245 11/20/21 0658 11/20/21 0928  WBC 24.6* 25.2* 21.1* 19.6* 15.6* 13.6*  --   HGB 7.4* 8.1* 6.8* 6.8* 7.3* 5.2* 7.8*  HCT 21.6* 24.2* 20.5* 20.0* 22.2* 15.7* 23.0*  PLT 236 229 199 228 222 174  --   MCV 82.4 84.3 85.8 85.8 85.4 85.3  --   MCH 28.2 28.2 28.5 29.2 28.1 28.3  --   MCHC 34.3 33.5 33.2 34.0 32.9 33.1  --   RDW 20.2* 20.3* 20.3* 21.1* 19.9* 18.4*  --   LYMPHSABS 1.0 0.3* 1.0 0.9  --  0.8  --   MONOABS 2.5* 1.0 2.0* 2.3*  --  1.8*  --   EOSABS 0.1 0.0 0.1 0.1  --  0.1  --   BASOSABS 0.0 0.0 0.0 0.0  --  0.0  --     Electrolytes Recent Labs  Lab  11/15/21 0233 11/16/21 1148 11/16/21 2233 11/17/21 0603 11/18/21 0151 11/19/21 0452 11/19/21 0950 11/20/21 0928  NA 130* 131* 126* 132* 130* 130*  --  131*  K 4.0 5.2* 6.6* 4.6 5.1 4.4  --  4.9  CL 95* 98 92* 96* 94* 95*  --  93*  CO2 22 20*  --  24 23 25   --   --   GLUCOSE 101* 96  90 94 92 71  --  78  BUN 21* 47* 67* 37* 49* 39*  --  66*  CREATININE 3.03* 3.92* 4.70* 3.08* 4.10* 4.12*  --  5.40*  CALCIUM 7.9* 6.8*  --  7.9* 7.6* 7.6*  --   --   ALBUMIN 2.1* 1.7*  --  2.1* 1.9* 1.8*  --   --   INR  --  1.8*  --   --   --   --   --   --   TSH  --   --   --   --   --   --  3.168  --     ------------------------------------------------------------------------------------------------------------------ No results for input(s): CHOL, HDL, LDLCALC, TRIG, CHOLHDL, LDLDIRECT in the last 72 hours.  No results found for: HGBA1C  Recent Labs    11/19/21 0950  TSH 3.168   ------------------------------------------------------------------------------------------------------------------ ID Labs Recent Labs  Lab 11/16/21 2233 11/17/21 0603 11/17/21 0604 11/18/21 0151 11/18/21 1621 11/19/21 0452 11/19/21 1619 11/19/21 2245 11/20/21 0658 11/20/21 0928  WBC  --   --    < >  --  25.2* 21.1* 19.6* 15.6* 13.6*  --   PLT  --   --    < >  --  229 199 228 222 174  --   CREATININE 4.70* 3.08*  --  4.10*  --  4.12*  --   --   --  5.40*   < > = values in this interval not displayed.   Cardiac Enzymes No results for input(s): CKMB, TROPONINI, MYOGLOBIN in the last 168 hours.  Invalid input(s): CK  Radiology Reports DG Abd 1 View  Result Date: 11/17/2021 CLINICAL DATA:  61 year old male status post mesenteric angiogram and Gel-Foam embolization for GI bleeding. EXAM: ABDOMEN - 1 VIEW COMPARISON:  CT Abdomen and Pelvis 11/15/2021 and earlier. FINDINGS: Portable AP supine view at 0538 hours. Embolization coils now at the gastroduodenal artery level. Multiple other abdominal surgical clips  mostly on the right. Nonobstructed bowel-gas pattern. No definite pneumoperitoneum on this supine view. Trace retained oral contrast suspected at the gastric cardia. Gray hazy opacity in the abdomen in keeping with ascites by CT. Negative lung bases. No acute osseous abnormality identified. IMPRESSION: 1. Nonobstructed bowel-gas pattern. 2. Ascites. 3. Recent gastroduodenal artery coil embolization. Electronically Signed   By: Genevie Ann M.D.   On: 11/17/2021 06:21   IR Angiogram Visceral Selective  Result Date: 11/16/2021 INDICATION: 61 year old male with history of recent gastrointestinal hemorrhage status post gastroduodenal artery coil embolization on 11/05/2021, currently admitted for rebleeding episode with EGD demonstrating hemorrhage from the duodenum. Endoscopic intervention was unsuccessful. EXAM: 1. Ultrasound-guided vascular access of the right common femoral artery. 2. Selective catheterization angiography of the celiac artery and superior mesenteric artery. 3. Sub selective catheterization and angiography of anterior and posterior pancreaticoduodenal branches. 4. Gel-Foam embolization of anterior and posterior pancreaticoduodenal branches. MEDICATIONS: None. ANESTHESIA/SEDATION: Moderate (conscious) sedation was employed during this procedure. A total of Versed 1.5 mg and Fentanyl 75 mcg was administered intravenously. Moderate Sedation Time: 110 minutes. The patient's level of consciousness and vital signs were monitored continuously by radiology nursing throughout the procedure under my direct supervision. CONTRAST:  44mL OMNIPAQUE IOHEXOL 300 MG/ML SOLN, 59mL OMNIPAQUE IOHEXOL 300 MG/ML SOLN, 39mL OMNIPAQUE IOHEXOL 300 MG/ML SOLN FLUOROSCOPY: 34 minutes, 42 seconds. Radiation Exposure Index (as provided by the fluoroscopic device): 2,725 mGy Kerma COMPLICATIONS: None immediate. PROCEDURE: Informed consent was obtained from the patient following explanation of the procedure, risks, benefits  and  alternatives. The patient understands, agrees and consents for the procedure. All questions were addressed. A time out was performed prior to the initiation of the procedure. Maximal barrier sterile technique utilized including caps, mask, sterile gowns, sterile gloves, large sterile drape, hand hygiene, and Betadine prep. The right groin was prepped and draped in standard fashion. Preprocedure ultrasound demonstrated widely patent right common femoral artery. The procedure was planned. Subdermal Local anesthesia provided 1% lidocaine. A small skin nick was made. Under direct ultrasound visualization, the right common femoral is accessed with a 21 gauge micropuncture needle. A permanent ultrasound image was captured and stored in the record. Micropuncture sheath was introduced and limited right lower extremity angiogram was performed which demonstrated adequate puncture site for closure device use. A J wire was directed to the micropuncture set to the abdominal aorta and a 5 French, 10 cm vascular sheath was placed. Five French C2 catheter was then advanced to the level of the celiac artery and celiac angiogram was performed. Angiogram demonstrated widely patent celiac and hepatic arteries with completely embolized gastroduodenal artery with indwelling coils. The catheter was then advanced to the level of the superior mesenteric artery. Angiogram was performed which demonstrated widely patent superior mesenteric artery and branch vessel supplying the duodenum without evidence of active extravasation. Selective catheterization of the pancreaticoduodenal arcade from the proximal super mesenteric artery was attempted with multiple catheter and wire combinations, however unsuccessful due to tortuosity. Therefore, the 5 [place] sheath was exchanged for a 7 [place], 55 cm Tourguide steerable sheath. The sheath was positioned near the ostium of the SMA through which a 5 [place], angled tip glide catheter was advanced and used  to help CT the steerable sheath. Next, the anterior pancreaticoduodenal arcade was selected with a Progreat alpha microcatheter and fathom 14 microwire. Angiogram was performed which demonstrated multifocal irregularity of the vessel supplying the duodenum however no evidence of active, intraluminal extravasation. Given irregularity of the vessels, Gel-Foam slurry embolization was administered to near stasis. No evidence of reflux was observed. The microsystem was then retracted and repositioned within the posterior pancreaticoduodenal arcade. Sub selective catheterization was performed angiogram demonstrated duodenal supply with irregularity of the distal branch vessels, however no evidence of active intraluminal extravasation. Gel-Foam slurry embolization was also performed at this location. The microsystem was removed and repeat SMA angiogram was performed which again demonstrated no evidence of active extravasation about the duodenum however there was some vessel pruning observed. The catheter was removed. The sheath was exchanged for an 8 French Angio-Seal device which was deployed successfully without difficulty. Distal pulses were unchanged. The patient tolerated the procedure well was transferred back to the floor in stable condition. IMPRESSION: 1. No evidence of active extravasation about the duodenum, however multifocal vessel irregularity was observed. 2. Technically successful Gel-Foam slurry embolization of the anterior and posterior pancreaticoduodenal arcades. Ruthann Cancer, MD [facility] Radiology Electronically Signed   By: Ruthann Cancer M.D.   On: 11/16/2021 15:18   IR Angiogram Visceral Selective  Result Date: 11/16/2021 INDICATION: 61 year old male with history of recent gastrointestinal hemorrhage status post gastroduodenal artery coil embolization on 11/05/2021, currently admitted for rebleeding episode with EGD demonstrating hemorrhage from  the duodenum. Endoscopic intervention was unsuccessful. EXAM: 1. Ultrasound-guided vascular access of the right common femoral artery. 2. Selective catheterization angiography of the celiac artery and superior mesenteric artery. 3. Sub selective catheterization and angiography of anterior and posterior pancreaticoduodenal branches. 4. Gel-Foam embolization of anterior and posterior pancreaticoduodenal  branches. MEDICATIONS: None. ANESTHESIA/SEDATION: Moderate (conscious) sedation was employed during this procedure. A total of Versed 1.5 mg and Fentanyl 75 mcg was administered intravenously. Moderate Sedation Time: 110 minutes. The patient's level of consciousness and vital signs were monitored continuously by radiology nursing throughout the procedure under my direct supervision. CONTRAST:  17mL OMNIPAQUE IOHEXOL 300 MG/ML SOLN, 79mL OMNIPAQUE IOHEXOL 300 MG/ML SOLN, 65mL OMNIPAQUE IOHEXOL 300 MG/ML SOLN FLUOROSCOPY: 34 minutes, 42 seconds. Radiation Exposure Index (as provided by the fluoroscopic device): 2,563 mGy Kerma COMPLICATIONS: None immediate. PROCEDURE: Informed consent was obtained from the patient following explanation of the procedure, risks, benefits and alternatives. The patient understands, agrees and consents for the procedure. All questions were addressed. A time out was performed prior to the initiation of the procedure. Maximal barrier sterile technique utilized including caps, mask, sterile gowns, sterile gloves, large sterile drape, hand hygiene, and Betadine prep. The right groin was prepped and draped in standard fashion. Preprocedure ultrasound demonstrated widely patent right common femoral artery. The procedure was planned. Subdermal Local anesthesia provided 1% lidocaine. A small skin nick was made. Under direct ultrasound visualization, the right common femoral is accessed with a 21 gauge micropuncture needle. A permanent ultrasound image was captured and stored in the record.  Micropuncture sheath was introduced and limited right lower extremity angiogram was performed which demonstrated adequate puncture site for closure device use. A J wire was directed to the micropuncture set to the abdominal aorta and a 5 French, 10 cm vascular sheath was placed. Five French C2 catheter was then advanced to the level of the celiac artery and celiac angiogram was performed. Angiogram demonstrated widely patent celiac and hepatic arteries with completely embolized gastroduodenal artery with indwelling coils. The catheter was then advanced to the level of the superior mesenteric artery. Angiogram was performed which demonstrated widely patent superior mesenteric artery and branch vessel supplying the duodenum without evidence of active extravasation. Selective catheterization of the pancreaticoduodenal arcade from the proximal super mesenteric artery was attempted with multiple catheter and wire combinations, however unsuccessful due to tortuosity. Therefore, the 5 [place] sheath was exchanged for a 7 [place], 55 cm Tourguide steerable sheath. The sheath was positioned near the ostium of the SMA through which a 5 [place], angled tip glide catheter was advanced and used to help CT the steerable sheath. Next, the anterior pancreaticoduodenal arcade was selected with a Progreat alpha microcatheter and fathom 14 microwire. Angiogram was performed which demonstrated multifocal irregularity of the vessel supplying the duodenum however no evidence of active, intraluminal extravasation. Given irregularity of the vessels, Gel-Foam slurry embolization was administered to near stasis. No evidence of reflux was observed. The microsystem was then retracted and repositioned within the posterior pancreaticoduodenal arcade. Sub selective catheterization was performed angiogram demonstrated duodenal supply with irregularity of the distal branch vessels, however no evidence of active intraluminal extravasation. Gel-Foam  slurry embolization was also performed at this location. The microsystem was removed and repeat SMA angiogram was performed which again demonstrated no evidence of active extravasation about the duodenum however there was some vessel pruning observed. The catheter was removed. The sheath was exchanged for an 8 French Angio-Seal device which was deployed successfully without difficulty. Distal pulses were unchanged. The patient tolerated the procedure well was transferred back to the floor in stable condition. IMPRESSION: 1. No evidence of active extravasation about the duodenum, however multifocal vessel irregularity was observed. 2. Technically successful Gel-Foam slurry embolization of the anterior and posterior pancreaticoduodenal arcades. Ruthann Cancer, MD [facility] Radiology Electronically Signed   By: Ruthann Cancer M.D.   On: 11/16/2021 15:18   IR Angiogram Selective Each Additional Vessel  Result Date: 11/16/2021 INDICATION: 61 year old male with history of recent gastrointestinal hemorrhage status post gastroduodenal artery coil embolization on 11/05/2021, currently admitted for rebleeding episode with EGD demonstrating hemorrhage from the duodenum. Endoscopic intervention was unsuccessful. EXAM: 1. Ultrasound-guided vascular access of the right common femoral artery. 2. Selective catheterization angiography of the celiac artery and superior mesenteric artery. 3. Sub selective catheterization and angiography of anterior and posterior pancreaticoduodenal branches. 4. Gel-Foam embolization of anterior and posterior pancreaticoduodenal branches. MEDICATIONS: None. ANESTHESIA/SEDATION: Moderate (conscious) sedation was employed during this procedure. A total of Versed 1.5 mg and Fentanyl 75 mcg was administered intravenously. Moderate Sedation Time: 110 minutes. The patient's level of consciousness and vital signs were monitored continuously by radiology  nursing throughout the procedure under my direct supervision. CONTRAST:  43mL OMNIPAQUE IOHEXOL 300 MG/ML SOLN, 59mL OMNIPAQUE IOHEXOL 300 MG/ML SOLN, 61mL OMNIPAQUE IOHEXOL 300 MG/ML SOLN FLUOROSCOPY: 34 minutes, 42 seconds. Radiation Exposure Index (as provided by the fluoroscopic device): 5,102 mGy Kerma COMPLICATIONS: None immediate. PROCEDURE: Informed consent was obtained from the patient following explanation of the procedure, risks, benefits and alternatives. The patient understands, agrees and consents for the procedure. All questions were addressed. A time out was performed prior to the initiation of the procedure. Maximal barrier sterile technique utilized including caps, mask, sterile gowns, sterile gloves, large sterile drape, hand hygiene, and Betadine prep. The right groin was prepped and draped in standard fashion. Preprocedure ultrasound demonstrated widely patent right common femoral artery. The procedure was planned. Subdermal Local anesthesia provided 1% lidocaine. A small skin nick was made. Under direct ultrasound visualization, the right common femoral is accessed with a 21 gauge micropuncture needle. A permanent ultrasound image was captured and stored in the record. Micropuncture sheath was introduced and limited right lower extremity angiogram was performed which demonstrated adequate puncture site for closure device use. A J wire was directed to the micropuncture set to the abdominal aorta and a 5 French, 10 cm vascular sheath was placed. Five French C2 catheter was then advanced to the level of the celiac artery and celiac angiogram was performed. Angiogram demonstrated widely patent celiac and hepatic arteries with completely embolized gastroduodenal artery with indwelling coils. The catheter was then advanced to the level of the superior mesenteric artery. Angiogram was performed which demonstrated widely patent superior mesenteric artery and branch vessel supplying the duodenum without  evidence of active extravasation. Selective catheterization of the pancreaticoduodenal arcade from the proximal super mesenteric artery was attempted with multiple catheter and wire combinations, however unsuccessful due to tortuosity. Therefore, the 5 [place] sheath was exchanged for a 7 [place], 55 cm Tourguide steerable sheath. The sheath was positioned near the ostium of the SMA through which a 5 [place], angled tip glide catheter was advanced and used to help CT the steerable sheath. Next, the anterior pancreaticoduodenal arcade was selected with a Progreat alpha microcatheter and fathom 14 microwire. Angiogram was performed which demonstrated multifocal irregularity of the vessel supplying the duodenum however no evidence of active, intraluminal extravasation. Given irregularity of the vessels, Gel-Foam slurry embolization was administered to near stasis. No evidence of reflux was observed. The microsystem was then retracted and repositioned within the posterior pancreaticoduodenal arcade. Sub selective catheterization was performed angiogram demonstrated duodenal supply with irregularity of the distal branch vessels, however no evidence of active intraluminal extravasation. Gel-Foam slurry embolization  was also performed at this location. The microsystem was removed and repeat SMA angiogram was performed which again demonstrated no evidence of active extravasation about the duodenum however there was some vessel pruning observed. The catheter was removed. The sheath was exchanged for an 8 French Angio-Seal device which was deployed successfully without difficulty. Distal pulses were unchanged. The patient tolerated the procedure well was transferred back to the floor in stable condition. IMPRESSION: 1. No evidence of active extravasation about the duodenum, however multifocal vessel irregularity was observed. 2. Technically successful Gel-Foam slurry embolization of the anterior and posterior pancreaticoduodenal  arcades. Ruthann Cancer, MD [facility] Radiology Electronically Signed   By: Ruthann Cancer M.D.   On: 11/16/2021 15:18   IR US Guide Vasc Access Right  Result Date: 11/16/2021 INDICATION: 61 year old male with history of recent gastrointestinal hemorrhage status post gastroduodenal artery coil embolization on 11/05/2021, currently admitted for rebleeding episode with EGD demonstrating hemorrhage from the duodenum. Endoscopic intervention was unsuccessful. EXAM: 1. Ultrasound-guided vascular access of the right common femoral artery. 2. Selective catheterization angiography of the celiac artery and superior mesenteric artery. 3. Sub selective catheterization and angiography of anterior and posterior pancreaticoduodenal branches. 4. Gel-Foam embolization of anterior and posterior pancreaticoduodenal branches. MEDICATIONS: None. ANESTHESIA/SEDATION: Moderate (conscious) sedation was employed during this procedure. A total of Versed 1.5 mg and Fentanyl 75 mcg was administered intravenously. Moderate Sedation Time: 110 minutes. The patient's level of consciousness and vital signs were monitored continuously by radiology nursing throughout the procedure under my direct supervision. CONTRAST:  26mL OMNIPAQUE IOHEXOL 300 MG/ML SOLN, 68mL OMNIPAQUE IOHEXOL 300 MG/ML SOLN, 35mL OMNIPAQUE IOHEXOL 300 MG/ML SOLN FLUOROSCOPY: 34 minutes, 42 seconds. Radiation Exposure Index (as provided by the fluoroscopic device): 6,734 mGy Kerma COMPLICATIONS: None immediate. PROCEDURE: Informed consent was obtained from the patient following explanation of the procedure, risks, benefits and alternatives. The patient understands, agrees and consents for the procedure. All questions were addressed. A time out was performed prior to the initiation of the procedure. Maximal barrier sterile technique utilized including caps, mask, sterile gowns, sterile gloves, large sterile drape, hand hygiene,  and Betadine prep. The right groin was prepped and draped in standard fashion. Preprocedure ultrasound demonstrated widely patent right common femoral artery. The procedure was planned. Subdermal Local anesthesia provided 1% lidocaine. A small skin nick was made. Under direct ultrasound visualization, the right common femoral is accessed with a 21 gauge micropuncture needle. A permanent ultrasound image was captured and stored in the record. Micropuncture sheath was introduced and limited right lower extremity angiogram was performed which demonstrated adequate puncture site for closure device use. A J wire was directed to the micropuncture set to the abdominal aorta and a 5 French, 10 cm vascular sheath was placed. Five French C2 catheter was then advanced to the level of the celiac artery and celiac angiogram was performed. Angiogram demonstrated widely patent celiac and hepatic arteries with completely embolized gastroduodenal artery with indwelling coils. The catheter was then advanced to the level of the superior mesenteric artery. Angiogram was performed which demonstrated widely patent superior mesenteric artery and branch vessel supplying the duodenum without evidence of active extravasation. Selective catheterization of the pancreaticoduodenal arcade from the proximal super mesenteric artery was attempted with multiple catheter and wire combinations, however unsuccessful due to tortuosity. Therefore, the 5 [place] sheath was exchanged for a 7 [place], 55 cm Tourguide steerable sheath. The sheath was positioned near the ostium of the SMA through which a 5 [place],  angled tip glide catheter was advanced and used to help CT the steerable sheath. Next, the anterior pancreaticoduodenal arcade was selected with a Progreat alpha microcatheter and fathom 14 microwire. Angiogram was performed which demonstrated multifocal irregularity of the vessel supplying the duodenum however no evidence of active, intraluminal  extravasation. Given irregularity of the vessels, Gel-Foam slurry embolization was administered to near stasis. No evidence of reflux was observed. The microsystem was then retracted and repositioned within the posterior pancreaticoduodenal arcade. Sub selective catheterization was performed angiogram demonstrated duodenal supply with irregularity of the distal branch vessels, however no evidence of active intraluminal extravasation. Gel-Foam slurry embolization was also performed at this location. The microsystem was removed and repeat SMA angiogram was performed which again demonstrated no evidence of active extravasation about the duodenum however there was some vessel pruning observed. The catheter was removed. The sheath was exchanged for an 8 French Angio-Seal device which was deployed successfully without difficulty. Distal pulses were unchanged. The patient tolerated the procedure well was transferred back to the floor in stable condition. IMPRESSION: 1. No evidence of active extravasation about the duodenum, however multifocal vessel irregularity was observed. 2. Technically successful Gel-Foam slurry embolization of the anterior and posterior pancreaticoduodenal arcades. Ruthann Cancer, MD [facility] Radiology Electronically Signed   By: Ruthann Cancer M.D.   On: 11/16/2021 15:18   DG CHEST PORT 1 VIEW  Result Date: 11/17/2021 CLINICAL DATA:  Hypoxia EXAM: PORTABLE CHEST 1 VIEW COMPARISON:  Previous studies including the examination done on 11/13/2021 FINDINGS: Transverse diameter of heart is increased. Central pulmonary vessels are slightly less prominent. There are linear densities in the parahilar regions and medial right lower lung fields suggesting scarring or subsegmental atelectasis. There is interval improvement in aeration of parahilar regions. No new focal infiltrates are seen. There is no pleural effusion or pneumothorax. IMPRESSION:  Cardiomegaly. There is decrease in pulmonary vascular congestion. There are no new focal pulmonary infiltrates. Electronically Signed   By: Elmer Picker M.D.   On: 11/17/2021 09:19   IR EMBO ART  VEN HEMORR LYMPH EXTRAV  INC GUIDE ROADMAPPING  Result Date: 11/16/2021 INDICATION: 61 year old male with history of recent gastrointestinal hemorrhage status post gastroduodenal artery coil embolization on 11/05/2021, currently admitted for rebleeding episode with EGD demonstrating hemorrhage from the duodenum. Endoscopic intervention was unsuccessful. EXAM: 1. Ultrasound-guided vascular access of the right common femoral artery. 2. Selective catheterization angiography of the celiac artery and superior mesenteric artery. 3. Sub selective catheterization and angiography of anterior and posterior pancreaticoduodenal branches. 4. Gel-Foam embolization of anterior and posterior pancreaticoduodenal branches. MEDICATIONS: None. ANESTHESIA/SEDATION: Moderate (conscious) sedation was employed during this procedure. A total of Versed 1.5 mg and Fentanyl 75 mcg was administered intravenously. Moderate Sedation Time: 110 minutes. The patient's level of consciousness and vital signs were monitored continuously by radiology nursing throughout the procedure under my direct supervision. CONTRAST:  61mL OMNIPAQUE IOHEXOL 300 MG/ML SOLN, 108mL OMNIPAQUE IOHEXOL 300 MG/ML SOLN, 21mL OMNIPAQUE IOHEXOL 300 MG/ML SOLN FLUOROSCOPY: 34 minutes, 42 seconds. Radiation Exposure Index (as provided by the fluoroscopic device): 8,546 mGy Kerma COMPLICATIONS: None immediate. PROCEDURE: Informed consent was obtained from the patient following explanation of the procedure, risks, benefits and alternatives. The patient understands, agrees and consents for the procedure. All questions were addressed. A time out was performed prior to the initiation of the procedure. Maximal barrier sterile technique utilized including caps, mask, sterile gowns,  sterile gloves, large sterile drape, hand hygiene, and Betadine prep. The right  groin was prepped and draped in standard fashion. Preprocedure ultrasound demonstrated widely patent right common femoral artery. The procedure was planned. Subdermal Local anesthesia provided 1% lidocaine. A small skin nick was made. Under direct ultrasound visualization, the right common femoral is accessed with a 21 gauge micropuncture needle. A permanent ultrasound image was captured and stored in the record. Micropuncture sheath was introduced and limited right lower extremity angiogram was performed which demonstrated adequate puncture site for closure device use. A J wire was directed to the micropuncture set to the abdominal aorta and a 5 French, 10 cm vascular sheath was placed. Five French C2 catheter was then advanced to the level of the celiac artery and celiac angiogram was performed. Angiogram demonstrated widely patent celiac and hepatic arteries with completely embolized gastroduodenal artery with indwelling coils. The catheter was then advanced to the level of the superior mesenteric artery. Angiogram was performed which demonstrated widely patent superior mesenteric artery and branch vessel supplying the duodenum without evidence of active extravasation. Selective catheterization of the pancreaticoduodenal arcade from the proximal super mesenteric artery was attempted with multiple catheter and wire combinations, however unsuccessful due to tortuosity. Therefore, the 5 [place] sheath was exchanged for a 7 [place], 55 cm Tourguide steerable sheath. The sheath was positioned near the ostium of the SMA through which a 5 [place], angled tip glide catheter was advanced and used to help CT the steerable sheath. Next, the anterior pancreaticoduodenal arcade was selected with a Progreat alpha microcatheter and fathom 14 microwire. Angiogram was performed which demonstrated multifocal irregularity of the vessel supplying the  duodenum however no evidence of active, intraluminal extravasation. Given irregularity of the vessels, Gel-Foam slurry embolization was administered to near stasis. No evidence of reflux was observed. The microsystem was then retracted and repositioned within the posterior pancreaticoduodenal arcade. Sub selective catheterization was performed angiogram demonstrated duodenal supply with irregularity of the distal branch vessels, however no evidence of active intraluminal extravasation. Gel-Foam slurry embolization was also performed at this location. The microsystem was removed and repeat SMA angiogram was performed which again demonstrated no evidence of active extravasation about the duodenum however there was some vessel pruning observed. The catheter was removed. The sheath was exchanged for an 8 French Angio-Seal device which was deployed successfully without difficulty. Distal pulses were unchanged. The patient tolerated the procedure well was transferred back to the floor in stable condition. IMPRESSION: 1. No evidence of active extravasation about the duodenum, however multifocal vessel irregularity was observed. 2. Technically successful Gel-Foam slurry embolization of the anterior and posterior pancreaticoduodenal arcades. Ruthann Cancer, MD [facility] Radiology Electronically Signed   By: Ruthann Cancer M.D.   On: 11/16/2021 15:18   CT ANGIO GI BLEED  Result Date: 11/20/2021 CLINICAL DATA:  Upper GI bleed common non variceal. Arterial source on endoscopy. EXAM: CTA ABDOMEN AND PELVIS WITHOUT AND WITH CONTRAST TECHNIQUE: Multidetector CT imaging of the abdomen and pelvis was performed using the standard protocol during bolus administration of intravenous contrast. Multiplanar reconstructed images and MIPs were obtained and reviewed to evaluate the vascular anatomy. RADIATION DOSE REDUCTION: This exam was performed according to the departmental  dose-optimization program which includes automated exposure control, adjustment of the mA and/or kV according to patient size and/or use of iterative reconstruction technique. CONTRAST:  164mL OMNIPAQUE IOHEXOL 350 MG/ML SOLN COMPARISON:  11/15/2021, 06/21/2021. FINDINGS: VASCULAR Aorta: Aortic atherosclerosis. Normal caliber aorta without aneurysm, dissection, vasculitis or significant stenosis. Celiac: Patent without evidence of aneurysm, dissection, vasculitis  or significant stenosis. Metallic coils are present in the region of the gastroduodenal artery. SMA: Patent without evidence of aneurysm, dissection, vasculitis or significant stenosis. Renals: Mild atherosclerotic calcification. Both renal arteries are patent without evidence of aneurysm, dissection, vasculitis, fibromuscular dysplasia or significant stenosis. IMA: Patent without evidence of aneurysm, dissection, vasculitis or significant stenosis. Inflow: Atherosclerotic calcifications. Patent without evidence of aneurysm, dissection, vasculitis or significant stenosis. Proximal Outflow: Atherosclerotic calcifications. Bilateral common femoral and visualized portions of the superficial and profunda femoral arteries are patent without evidence of aneurysm, dissection, vasculitis or significant stenosis. Veins: No obvious venous abnormality within the limitations of this arterial phase study. Review of the MIP images confirms the above findings. NON-VASCULAR Lower chest: The heart is enlarged and there is a moderate pericardial effusion measuring up to 1.7 cm. Strandy atelectasis or infiltrate is present at the lung bases. There is a trace left pleural effusion. Hepatobiliary: No focal liver abnormality. A stone is present within the gallbladder. The gallbladder is contracted and gallbladder wall thickening is noted. No biliary ductal dilatation. Pancreas: There is mild dilatation of the pancreatic duct measuring 4 mm, not significantly changed from the  prior exam. No local inflammatory changes. Spleen: The spleen is normal in size. There is a stable hypodensity in the spleen measuring 2 cm. Adrenals/Urinary Tract: The adrenal glands are within normal limits. Bilateral renal atrophy is noted. There is a complex hypodense mass in the right kidney measuring 2.9 cm. Stable subcentimeter hypodensities are noted in the kidneys bilaterally. No hydronephrosis. Excreted contrast is present in the urinary bladder. Stomach/Bowel: The stomach is partially distended. Evaluation for hemorrhage is limited in the presence of oral contrast. No bowel obstruction, free air, or pneumatosis. Right hemicolectomy changes are present. Lymphatic: Evaluation for lymphadenopathy is limited due to ascites. Reproductive: Prostate is unremarkable. Other: Moderate ascites is noted. Musculoskeletal: No acute osseous abnormality. IMPRESSION: VASCULAR 1. No evidence of active GI hemorrhage. 2. Coil embolization of the gastroduodenal artery. 3. Aortic atherosclerosis with no significant stenosis or aneurysm. NON-VASCULAR 1. Stable moderate ascites. 2. Cardiomegaly with moderate pericardial effusion. 3. Cholelithiasis. 4. Strandy atelectasis or infiltrate at the lung bases. Small left pleural effusion. 5. Bilateral renal atrophy with multiple stable hypodensities. There is a complex lesion in the mid right kidney, stable from prior exams and previously characterized as a hemorrhagic or proteinaceous cyst on MRI. Electronically Signed   By: Brett Fairy M.D.   On: 11/20/2021 04:39

## 2021-11-20 NOTE — Progress Notes (Signed)
[facility] Progress Note   Subjective:  Seen in room - noted events of re-bleeding overnight with Hgb down to 5.2 with plan for CT angio followed by surgical duodenal ulcer repair today. Unclear exactly what happened as to why was not dialyzed overnight -> apologized to patient and will plan for dialysis after surgery today, as he has been transfused numerous times in the past 24hr. He is frustrated, understandably, but denies CP or dyspnea.  Objective Vitals:   11/20/21 0330 11/20/21 0816 11/20/21 0853 11/20/21 0857  BP: 131/65 (!) 182/59 (!) 175/70   Pulse: 87 88 85   Resp: 18 19 18    Temp: 98.2 F (36.8 C) 98 F (36.7 C) 98.4 F (36.9 C)   TempSrc: Axillary Oral Oral   SpO2: 100% 99% 98%   Weight:    70.7 kg  Height:    6\' 1"  (1.854 m)   Physical Exam General: Chronically ill appearing man, NAD. Room air. Heart: RRR Lungs: CTA anteriorly Abdomen: soft, non-tender Extremities: No LE edema Dialysis Access: LUE AVF + thrill  Additional Objective Labs: Basic Metabolic Panel: Recent Labs  Lab 11/17/21 0603 11/18/21 0151 11/19/21 0452 11/20/21 0928  NA 132* 130* 130* 131*  K 4.6 5.1 4.4 4.9  CL 96* 94* 95* 93*  CO2 24 23 25   --   GLUCOSE 94 92 71 78  BUN 37* 49* 39* 66*  CREATININE 3.08* 4.10* 4.12* 5.40*  CALCIUM 7.9* 7.6* 7.6*  --   PHOS 4.4 5.2* 5.0*  --    Liver Function Tests: Recent Labs  Lab 11/17/21 0603 11/18/21 0151 11/19/21 0452  ALBUMIN 2.1* 1.9* 1.8*   CBC: Recent Labs  Lab 11/18/21 1621 11/19/21 0452 11/19/21 1619 11/19/21 2245 11/20/21 0658 11/20/21 0928  WBC 25.2* 21.1* 19.6* 15.6* 13.6*  --   NEUTROABS 23.9* 17.7* 16.0*  --  10.8*  --   HGB 8.1* 6.8* 6.8* 7.3* 5.2* 7.8*  HCT 24.2* 20.5* 20.0* 22.2* 15.7* 23.0*  MCV 84.3 85.8 85.8 85.4 85.3  --   PLT 229 199 228 222 174  --    Studies/Results: CT ANGIO GI BLEED  Result Date: 11/20/2021 CLINICAL DATA:  Upper GI bleed common non variceal. Arterial source on  endoscopy. EXAM: CTA ABDOMEN AND PELVIS WITHOUT AND WITH CONTRAST TECHNIQUE: Multidetector CT imaging of the abdomen and pelvis was performed using the standard protocol during bolus administration of intravenous contrast. Multiplanar reconstructed images and MIPs were obtained and reviewed to evaluate the vascular anatomy. RADIATION DOSE REDUCTION: This exam was performed according to the departmental dose-optimization program which includes automated exposure control, adjustment of the mA and/or kV according to patient size and/or use of iterative reconstruction technique. CONTRAST:  154mL OMNIPAQUE IOHEXOL 350 MG/ML SOLN COMPARISON:  11/15/2021, 06/21/2021. FINDINGS: VASCULAR Aorta: Aortic atherosclerosis. Normal caliber aorta without aneurysm, dissection, vasculitis or significant stenosis. Celiac: Patent without evidence of aneurysm, dissection, vasculitis or significant stenosis. Metallic coils are present in the region of the gastroduodenal artery. SMA: Patent without evidence of aneurysm, dissection, vasculitis or significant stenosis. Renals: Mild atherosclerotic calcification. Both renal arteries are patent without evidence of aneurysm, dissection, vasculitis, fibromuscular dysplasia or significant stenosis. IMA: Patent without evidence of aneurysm, dissection, vasculitis or significant stenosis. Inflow: Atherosclerotic calcifications. Patent without evidence of aneurysm, dissection, vasculitis or significant stenosis. Proximal Outflow: Atherosclerotic calcifications. Bilateral common femoral and visualized portions of the superficial and profunda femoral arteries are patent without evidence of aneurysm, dissection, vasculitis or significant stenosis. Veins: No obvious venous abnormality  within the limitations of this arterial phase study. Review of the MIP images confirms the above findings. NON-VASCULAR Lower chest: The heart is enlarged and there is a moderate pericardial effusion measuring up to 1.7  cm. Strandy atelectasis or infiltrate is present at the lung bases. There is a trace left pleural effusion. Hepatobiliary: No focal liver abnormality. A stone is present within the gallbladder. The gallbladder is contracted and gallbladder wall thickening is noted. No biliary ductal dilatation. Pancreas: There is mild dilatation of the pancreatic duct measuring 4 mm, not significantly changed from the prior exam. No local inflammatory changes. Spleen: The spleen is normal in size. There is a stable hypodensity in the spleen measuring 2 cm. Adrenals/Urinary Tract: The adrenal glands are within normal limits. Bilateral renal atrophy is noted. There is a complex hypodense mass in the right kidney measuring 2.9 cm. Stable subcentimeter hypodensities are noted in the kidneys bilaterally. No hydronephrosis. Excreted contrast is present in the urinary bladder. Stomach/Bowel: The stomach is partially distended. Evaluation for hemorrhage is limited in the presence of oral contrast. No bowel obstruction, free air, or pneumatosis. Right hemicolectomy changes are present. Lymphatic: Evaluation for lymphadenopathy is limited due to ascites. Reproductive: Prostate is unremarkable. Other: Moderate ascites is noted. Musculoskeletal: No acute osseous abnormality. IMPRESSION: VASCULAR 1. No evidence of active GI hemorrhage. 2. Coil embolization of the gastroduodenal artery. 3. Aortic atherosclerosis with no significant stenosis or aneurysm. NON-VASCULAR 1. Stable moderate ascites. 2. Cardiomegaly with moderate pericardial effusion. 3. Cholelithiasis. 4. Strandy atelectasis or infiltrate at the lung bases. Small left pleural effusion. 5. Bilateral renal atrophy with multiple stable hypodensities. There is a complex lesion in the mid right kidney, stable from prior exams and previously characterized as a hemorrhagic or proteinaceous cyst on MRI. Electronically Signed   By: Brett Fairy M.D.   On: 11/20/2021 04:39    Medications:   [MAR Hold] sodium chloride 10 mL/hr (11/16/21 1200)   [MAR Hold] sodium chloride     sodium chloride 10 mL/hr at 11/20/21 0935    ceFAZolin (ANCEF) IV     And   metronidazole      [MAR Hold] (feeding supplement) PROSource Plus  30 mL Oral BID BM   [MAR Hold] sodium chloride   Intravenous Once   [MAR Hold] sodium chloride   Intravenous Once   Chlorhexidine Gluconate Cloth  6 each Topical Once   [MAR Hold] doxercalciferol  2 mcg Intravenous Q T,Th,Sa-HD   [MAR Hold] metoprolol tartrate  25 mg Oral BID   [MAR Hold] pantoprazole  40 mg Oral BID   [MAR Hold] sevelamer carbonate  1,600 mg Oral TID with meals    D[facility] 3:45 hrs 450/[place]oflow 1.5 65 kg 2.0K/2.0 Ca UFP 4 l AVF No heparin -Hectorol 2 mcg IV TIW -Mircera 150 mcg IV q 2 weeks (last dose 11/12/2021) -Venofer 100 mg IV weekly   Assessment/Plan: Recurrent GI bleed: Underwent EGD on 2/1 that showed active bleeding from duodenal ulcer that could not be controlled definitively by clipping or bipolar cautery and thereafter sent to IR for embolization after temporizing measures with hemospray.  He later had mesenteric angiogram with Gelfoam embolization of anterior and posterior pancreaticoduodenal arcade (this after having undergone greater duodenal artery embolization on 11/05/2021). Re-bleeding overnight, got another 2U PRBCs. S/p CT angio this morning and now going for ex-lap with surgical ulcer repair. ESRD: Usual TTS schedule - not dialyzed yesterday, for HD today after surgery. Anemia (ABLA + ESRD): See # 1.  S/p 12U PRBCs + 2U FFP this admit. Surgical repair today. Last received Mircera at outpt unit on 1/28 -> not due for repeat dose yet. MBD: Ca/Phos ok for now. Continue Renvela and VDRA. HTN/volume: No LE edema, 5kg above EDW per weights - unclear if accurate. Nutrition: Albumin very low (1.8) - give protein supplements as tolerated.    Veneta Penton, PA-C 11/20/2021, 10:07 AM  [employer]

## 2021-11-20 NOTE — Anesthesia Preprocedure Evaluation (Signed)
Anesthesia Evaluation  Patient identified by MRN, date of birth, ID band Patient awake    Reviewed: Allergy & Precautions, NPO status , Patient's Chart, lab work & pertinent test results  Airway Mallampati: II  TM Distance: >3 FB Neck ROM: Full    Dental  (+) Dental Advisory Given   Pulmonary Current Smoker,    breath sounds clear to auscultation       Cardiovascular hypertension, Pt. on medications and Pt. on home beta blockers +CHF and + DOE   Rhythm:Regular Rate:Normal  EF 45%. Moderate MR, Mod/ severe TR.   Neuro/Psych negative neurological ROS     GI/Hepatic Neg liver ROS, PUD,   Endo/Other  negative endocrine ROS  Renal/GU ESRF and DialysisRenal disease     Musculoskeletal   Abdominal   Peds  Hematology  (+) Blood dyscrasia, anemia ,   Anesthesia Other Findings   Reproductive/Obstetrics                             Lab Results  Component Value Date   WBC 13.6 (H) 11/20/2021   HGB 5.2 (LL) 11/20/2021   HCT 15.7 (L) 11/20/2021   MCV 85.3 11/20/2021   PLT 174 11/20/2021   Lab Results  Component Value Date   CREATININE 4.12 (H) 11/19/2021   BUN 39 (H) 11/19/2021   NA 130 (L) 11/19/2021   K 4.4 11/19/2021   CL 95 (L) 11/19/2021   CO2 25 11/19/2021    Anesthesia Physical Anesthesia Plan  ASA: 4 and emergent  Anesthesia Plan: General   Post-op Pain Management: Tylenol PO (pre-op) and Ketamine IV   Induction: Intravenous  PONV Risk Score and Plan: 1 and Dexamethasone, Ondansetron and Treatment may vary due to age or medical condition  Airway Management Planned: Oral ETT  Additional Equipment:   Intra-op Plan:   Post-operative Plan: Extubation in OR and Possible Post-op intubation/ventilation  Informed Consent: I have reviewed the patients History and Physical, chart, labs and discussed the procedure including the risks, benefits and alternatives for the proposed  anesthesia with the patient or authorized representative who has indicated his/her understanding and acceptance.     Dental advisory given  Plan Discussed with: CRNA  Anesthesia Plan Comments:         Anesthesia Quick Evaluation

## 2021-11-20 NOTE — Transfer of Care (Signed)
Immediate Anesthesia Transfer of Care Note  Patient: Timothy [name]  Procedure(s) Performed: EXPLORATORY LAPAROTOMY, REPAIR OF BLEEDING DUODENAL ULCER (Abdomen)  Patient Location: PACU  Anesthesia Type:General  Level of Consciousness: drowsy, patient cooperative and Patient remains intubated per anesthesia plan  Airway & Oxygen Therapy: Patient Spontanous Breathing  Post-op Assessment: Report given to RN and Post -op Vital signs reviewed and stable  Post vital signs: Reviewed and stable  Last Vitals:  Vitals Value Taken Time  BP 123/87 11/20/21 1225  Temp    Pulse 95 11/20/21 1226  Resp 12 11/20/21 1226  SpO2 97 % 11/20/21 1226  Vitals shown include unvalidated device data.  Last Pain:  Vitals:   11/20/21 0853  TempSrc: Oral  PainSc:       Patients Stated Pain Goal: 1 (89/48/34 7583)  Complications: No notable events documented.

## 2021-11-20 NOTE — Progress Notes (Signed)
Pt Hgb had come up to 7.3 after 1 unit of PRBC, so MD on call held next unit.  Then pt had a large bloody BM, and MD came to floor to assess him.  Pt made NPO, and 1 more unit of PRBC ordered. Will continue to monitor.

## 2021-11-20 NOTE — Anesthesia Postprocedure Evaluation (Signed)
Anesthesia Post Note  Patient: Timothy [name]  Procedure(s) Performed: EXPLORATORY LAPAROTOMY, REPAIR OF BLEEDING DUODENAL ULCER (Abdomen)     Patient location during evaluation: PACU Anesthesia Type: General Level of consciousness: awake and alert Pain management: pain level controlled Vital Signs Assessment: post-procedure vital signs reviewed and stable Respiratory status: spontaneous breathing, nonlabored ventilation, respiratory function stable and patient connected to nasal cannula oxygen Cardiovascular status: blood pressure returned to baseline and stable Postop Assessment: no apparent nausea or vomiting Anesthetic complications: no   No notable events documented.  Last Vitals:  Vitals:   11/20/21 1315 11/20/21 1330  BP: 123/89 114/85  Pulse: 93 95  Resp: 20 12  Temp:    SpO2: 94% (!) 88%    Last Pain:  Vitals:   11/20/21 1235  TempSrc:   PainSc: 0-No pain                 Tiajuana Amass

## 2021-11-21 ENCOUNTER — Encounter (HOSPITAL_COMMUNITY): Payer: Self-pay | Admitting: Surgery

## 2021-11-21 ENCOUNTER — Inpatient Hospital Stay (HOSPITAL_COMMUNITY): Payer: Medicare (Managed Care)

## 2021-11-21 DIAGNOSIS — K264 Chronic or unspecified duodenal ulcer with hemorrhage: Secondary | ICD-10-CM | POA: Diagnosis not present

## 2021-11-21 LAB — CBC WITH DIFFERENTIAL/PLATELET
Abs Immature Granulocytes: 0.14 10*3/uL — ABNORMAL HIGH (ref 0.00–0.07)
Basophils Absolute: 0 10*3/uL (ref 0.0–0.1)
Basophils Relative: 0 %
Eosinophils Absolute: 0 10*3/uL (ref 0.0–0.5)
Eosinophils Relative: 0 %
HCT: 26.5 % — ABNORMAL LOW (ref 39.0–52.0)
Hemoglobin: 9.4 g/dL — ABNORMAL LOW (ref 13.0–17.0)
Immature Granulocytes: 1 %
Lymphocytes Relative: 3 %
Lymphs Abs: 0.5 10*3/uL — ABNORMAL LOW (ref 0.7–4.0)
MCH: 28.8 pg (ref 26.0–34.0)
MCHC: 35.5 g/dL (ref 30.0–36.0)
MCV: 81.3 fL (ref 80.0–100.0)
Monocytes Absolute: 1.6 10*3/uL — ABNORMAL HIGH (ref 0.1–1.0)
Monocytes Relative: 10 %
Neutro Abs: 13.6 10*3/uL — ABNORMAL HIGH (ref 1.7–7.7)
Neutrophils Relative %: 86 %
Platelets: 248 10*3/uL (ref 150–400)
RBC: 3.26 MIL/uL — ABNORMAL LOW (ref 4.22–5.81)
RDW: 18.3 % — ABNORMAL HIGH (ref 11.5–15.5)
WBC: 15.8 10*3/uL — ABNORMAL HIGH (ref 4.0–10.5)
nRBC: 0 % (ref 0.0–0.2)

## 2021-11-21 MED ORDER — SUCRALFATE 1 GM/10ML PO SUSP
1.0000 g | Freq: Three times a day (TID) | ORAL | Status: DC
  Administered 2021-11-21 – 2021-11-23 (×7): 1 g via ORAL
  Filled 2021-11-21 (×10): qty 10

## 2021-11-21 MED ORDER — METOPROLOL TARTRATE 5 MG/5ML IV SOLN: 5.0000 mg | INTRAVENOUS | Status: DC | PRN

## 2021-11-21 MED ORDER — HYDROMORPHONE HCL 1 MG/ML IJ SOLN
0.5000 mg | INTRAMUSCULAR | Status: DC | PRN
  Administered 2021-11-21 – 2021-11-23 (×12): 1 mg via INTRAVENOUS
  Filled 2021-11-21 (×12): qty 1

## 2021-11-21 NOTE — Progress Notes (Signed)
°  [facility] Progress Note   Subjective:  Seen in room, no c/o's. Has surgery yesterday, not sure the details.   Objective Vitals:   11/21/21 0600 11/21/21 0630 11/21/21 0700 11/21/21 0946  BP: 127/69     Pulse: 93     Resp: 14     Temp:   98.2 F (36.8 C)   TempSrc:   Oral   SpO2: 94%   95%  Weight:  70.5 kg    Height:       Physical Exam General: Chronically ill appearing man, NAD. Room air. Heart: RRR Lungs: CTA anteriorly Abdomen: soft, non-tender Extremities: No LE edema Dialysis Access: LUE AVF + thrill   [facility]  3h 28min  450/1.5  65kg  2/2 bath  P4   L AVF  Hep none -Hectorol 2 mcg IV TIW -Mircera 150 mcg IV q 2 weeks (last 1/28  > due 2/11) -Venofer 100 mg IV weekly   Assessment/Plan: Recurrent GI bleed: sp EGD 2/1 that showed active bleeding from duodenal ulcer that could not be controlled, so pt sent to IR for embolization of GDA.  He improved then deteriorated again and went for mesenteric angiogram w/ embolization of anterior and posterior pancreaticoduodenal arcade. He had further rebleeding and went to OR per gen surg yesterday w/ ex-lap and surgical ulcer repair. ESRD: has HD x 4 last week including mwf and Sunday. Next HD tomorrow/ Tuesday to get back on schedule.  Anemia (ABLA + ESRD):  total 14U PRBCs + 2U FFP this admit. Surgical repair 2/05 yesterday. Next esa is due on 2/11, will order.  MBD: Ca/Phos ok for now. Continue Renvela and VDRA. HTN/volume: No LE edema, 5kg up by wts. BP's normal to soft.  Nutrition: Albumin very low (1.8) - give protein supplements as tolerated.    Kelly Splinter, MD 11/21/2021, 11:31 AM       Recent Labs  Lab 11/18/21 0151 11/19/21 0452 11/20/21 0928 11/20/21 1050  K 5.1 4.4 4.9 5.1  BUN 49* 39* 66* 69*  CREATININE 4.10* 4.12* 5.40* 5.10*  ALBUMIN 1.9* 1.8*  --   --   CALCIUM 7.6* 7.6*  --   --   PHOS 5.2* 5.0*  --   --    Inpatient medications:  sodium chloride   Intravenous Once    sodium chloride   Intravenous Once   Chlorhexidine Gluconate Cloth  6 each Topical Daily   doxercalciferol  2 mcg Intravenous Q T,Th,Sa-HD   pantoprazole (PROTONIX) IV  40 mg Intravenous Q12H   sucralfate  1 g Oral TID WC & HS    sodium chloride 10 mL/hr (11/16/21 1200)   sodium chloride     sodium chloride     hydrALAZINE, HYDROmorphone (DILAUDID) injection, ipratropium-albuterol, metoprolol tartrate

## 2021-11-21 NOTE — Evaluation (Signed)
Physical Therapy Evaluation Patient Details Name: Timothy [name] MRN: [id number] DOB: 15-Oct-1961 Today's Date: 11/21/2021  History of Present Illness  61 y/o male presented to ED on 11/12/21 after being found on floor with rectal bleeding. Multiple recent admissions for CHF volume overload, encephalopathy, pulmonary edema, and GI bleed. CTA showed large pericardial effusion and ascites. EGD on 1/30 demonstrated duodenal ulcer with overlying adherent clot but no signs of active bleeding. Pt underwent exploratory laparotomy and repair of bleeding duodenal ulcer on 2/5.  PMH: ESRD (HD TTS), colon cancer, CHF, polysubstance abuse  Clinical Impression  Pt presents to PT with decreased gait due to illness and inactivity. Expect pt will make good progress back to baseline with mobility. Will follow acutely but doubt pt will need PT after DC. Recommend rollator for home.         Recommendations for follow up therapy are one component of a multi-disciplinary discharge planning process, led by the attending physician.  Recommendations may be updated based on patient status, additional functional criteria and insurance authorization.  Follow Up Recommendations No PT follow up    Assistance Recommended at Discharge Intermittent Supervision/Assistance  Patient can return home with the following  Help with stairs or ramp for entrance    Equipment Recommendations Rollator (4 wheels)  Recommendations for Other Services       Functional Status Assessment Patient has had a recent decline in their functional status and demonstrates the ability to make significant improvements in function in a reasonable and predictable amount of time.     Precautions / Restrictions Precautions Precautions: Fall;Other (comment) Precaution Comments: monitor O2 (does not wear at baseline), JP drain Restrictions Weight Bearing Restrictions: No      Mobility  Bed Mobility Overal bed mobility: Modified Independent                   Transfers Overall transfer level: Needs assistance Equipment used: Rolling walker (2 wheels) Transfers: Sit to/from Stand Sit to Stand: Supervision           General transfer comment: Incr time and effort to perform.    Ambulation/Gait Ambulation/Gait assistance: Min guard Gait Distance (Feet): 130 Feet Assistive device: Rolling walker (2 wheels) Gait Pattern/deviations: Step-through pattern, Decreased stride length, Trunk flexed Gait velocity: decr Gait velocity interpretation: 1.31 - 2.62 ft/sec, indicative of limited community ambulator   General Gait Details: Assist for safety. Verbal cues to stand more erect  Stairs            Wheelchair Mobility    Modified Rankin (Stroke Patients Only)       Balance Overall balance assessment: Needs assistance Sitting-balance support: No upper extremity supported, Feet supported Sitting balance-Leahy Scale: Good     Standing balance support: Bilateral upper extremity supported, No upper extremity supported, During functional activity Standing balance-Leahy Scale: Poor Standing balance comment: UE support primarily for pain control                             Pertinent Vitals/Pain Pain Assessment Pain Assessment: 0-10 Pain Score: 9  Pain Location: back Pain Descriptors / Indicators: Sore Pain Intervention(s): Limited activity within patient's tolerance, Patient requesting pain meds-RN notified    Home Living Family/patient expects to be discharged to:: Private residence Living Arrangements: Children Available Help at Discharge: Family;Available PRN/intermittently Type of Home: Apartment Home Access: Stairs to enter Entrance Stairs-Rails: Left Entrance Stairs-Number of Steps: 2 flights to 3rd story apt  Home Layout: One level Home Equipment: Shower seat;Cane - single point Additional Comments: reports he lives with daughter (62 y/o) who works    Prior Function Prior Level of  Function : Independent/Modified Independent             Mobility Comments: Typically independent without DME; physical activity limited by HD (pt reports typically worn out post-HD and day after HD). No longer drives, does not work ADLs Comments: Reports Independent with ADLs, IADls in the home. assist with transportation to HD     Hand Dominance   Dominant Hand: Right    Extremity/Trunk Assessment   Upper Extremity Assessment Upper Extremity Assessment: Defer to OT evaluation    Lower Extremity Assessment Lower Extremity Assessment: Generalized weakness    Cervical / Trunk Assessment Cervical / Trunk Assessment: Normal  Communication   Communication: No difficulties  Cognition Arousal/Alertness: Awake/alert Behavior During Therapy: WFL for tasks assessed/performed, Flat affect Overall Cognitive Status: Within Functional Limits for tasks assessed                                 General Comments: likely at baseline, some decreased awareness of deficits/medical complexities as he pulled NG suction tube out of this tube and asking for ice chips though NPO        General Comments General comments (skin integrity, edema, etc.): Pt removed pulse ox and disconnected cardiac monitor prior to amb    Exercises     Assessment/Plan    PT Assessment Patient needs continued PT services  PT Problem List Decreased strength;Decreased balance;Decreased mobility       PT Treatment Interventions DME instruction;Gait training;Stair training;Functional mobility training;Therapeutic activities;Therapeutic exercise;Balance training;Patient/family education    PT Goals (Current goals can be found in the Care Plan section)  Acute Rehab PT Goals Patient Stated Goal: have some water PT Goal Formulation: With patient Time For Goal Achievement: 12/05/21 Potential to Achieve Goals: Good    Frequency Min 3X/week     Co-evaluation               AM-PAC PT "6  Clicks" Mobility  Outcome Measure Help needed turning from your back to your side while in a flat bed without using bedrails?: None Help needed moving from lying on your back to sitting on the side of a flat bed without using bedrails?: None Help needed moving to and from a bed to a chair (including a wheelchair)?: A Little Help needed standing up from a chair using your arms (e.g., wheelchair or bedside chair)?: A Little Help needed to walk in hospital room?: A Little Help needed climbing 3-5 steps with a railing? : A Little 6 Click Score: 20    End of Session   Activity Tolerance: Patient tolerated treatment well Patient left: in chair;with call bell/phone within reach;with chair alarm set Nurse Communication: Mobility status;Patient requests pain meds PT Visit Diagnosis: Other abnormalities of gait and mobility (R26.89);Muscle weakness (generalized) (M62.81)    Time: 1215-1227 PT Time Calculation (min) (ACUTE ONLY): 12 min   Charges:   PT Evaluation $PT Eval Moderate Complexity: [place] Pager [phone] Office [place] 11/21/2021, 3:14 PM

## 2021-11-21 NOTE — Progress Notes (Signed)
NAME:  Timothy [name], MRN:  [id number], DOB:  11-24-1960, LOS: 9 ADMISSION DATE:  11/12/2021 CONSULTATION DATE:  11/15/2021 REFERRING MD:  [place] Crane - [facility] CHIEF COMPLAINT:  GIB, hypotension   History of Present Illness:  61 year old man who presented to [facility] ED 1/28 via EMS after being found down on the bathroom floor with rectal bleeding.  EMS was called and patient was transferred to [facility] ED. Found to have GI bleed with associated symptomatic anemia (Hgb 5.1).  Transfused 3 units PRBCs.  EGD 1/30 demonstrated duodenal ulcer with overlying adherent clot but no signs of active bleeding.  On 1/31, patient had large volume melena with clots and associated Hgb drop to 6.6.  He became relatively hypotensive with SBPs 80s and mild tachycardia to low 100s.  Received additional 2 units PRBCs.  PCCM was consulted for possible ICU admission/pressor initiation.  Pertinent Medical History:   Past Medical History:  Diagnosis Date   Anemia of chronic kidney failure    BPH (benign prostatic hyperplasia)    Colon cancer [facility]) 2014   spouse states he had surgury for colon CA   End stage renal disease on dialysis ([place]) 2017   Hypertension    Hypertensive heart disease with chronic diastolic congestive heart failure ([place]) 07/17/2016   Polysubstance abuse ([place])    History of heroin and marijuana use   Significant Hospital Events: Including procedures, antibiotic start and stop dates in addition to other pertinent events   1/28 - Presented to [facility] via EMS for rectal bleeding, found down at home. Admitted to [facility]. 1/30 - EGD demonstrated duodenal ulcer with overlying adherent clot but no active bleeding. GI following. 1/31 - Large volume melena with clots, mild tachycardia and hypotension with SBPs 80s. Hgb 6.6, 2U PRBCs ordered. PCCM consulted for possible ICU admission. 2/1 - EGD with spurting duodenal ulcer. IR consulted. Patient underwent gel foam embo of the anterior and posterior  pancreatico-duodenal branches.  2/2 - A. Fib with RVR. Self-converted to sinus tachycardia  2/4 hgb drop  2/5 hgb dropped to 5, receiving PRBC. Plan to transfer back to ICU and plan for OR for ex lap & investigation of bleeding duodenal ulcer   Interim History / Subjective:   No overnight events. Patient removed NGT this AM.   At this time, [name] denies any complaints other than wanting to eat. He initially endorsed flatus but then denies. He denies any BM.   Objective:  Blood pressure 127/69, pulse 93, temperature 98.2 F (36.8 C), temperature source Oral, resp. rate 14, height 6\' 1"  (1.854 m), weight 70.5 kg, SpO2 94 %.        Intake/Output Summary (Last 24 hours) at 11/21/2021 4196 Last data filed at 11/21/2021 0600 Gross per 24 hour  Intake 2013 ml  Output 2395 ml  Net -382 ml    Filed Weights   11/20/21 0857 11/20/21 1415 11/21/21 0630  Weight: 70.7 kg 70.7 kg 70.5 kg   Physical Examination:  General: Chronically ill appearing older adult in no acute distress.  HEENT: Moist mucus membranes.  Neuro:  AAOx3, following commands no focal deficit  CV: rr Q2W9 systolic murmur.  PULM: Clear to auscultation throughout GI: Soft, non-tender, non-distended. Mid-line incision is clean and dry. Hypoactive bowel sounds.   Extremities: No acute deformity no cyanosis or clubbing. LUE AVF + thrill  Skin:No rashes   Resolved Hospital Problem List:  Hypotension  Assessment & Plan:   Upper GIB 2/2 Duodenal Ulcer Presented to [facility]  ED via EMS 1/28 for rectal bleeding. S/p multiple EGD and IR embolization. Taken for ex-lap yesterday.    - Appreciate GI and general surgery management - All AC on hold - Continue Protonix 40 mg IV BID - NPO per general surgery  - Start Carafate QID   ABLA In the setting of active GIB. S/p 14U of pRBCs, 2U FFP.   - CBC daily - Transfuse for Hgb 7.0 or hemodynamically significant bleeding    Hypertension  Only home medication listed is  Carvedilol. Often experiences dialysis associated hypotension. Due to this, may need to have a higher BP goal for now. At this time, BP is within goal.    - Hold home Coreg   ESRD on HD - Appreciate HD management - HD as per Nephro recs.    A. Fib with RVR  PVCs Transient Ventricular Bigeminy - Patient remains in sinus rhythm at this time. Minimal PVC burden on telemetry.   Chronic Pain Syndrome - Holding home Oxycodone   Best Practice: (right click and "Reselect all SmartList Selections" daily)   Diet/type: NPO DVT prophylaxis: SCD GI prophylaxis: PPI Lines: N/A Foley:  N/A Code Status:  full code Last date of multidisciplinary goals of care discussion: N/A  Labs:  CBC: Recent Labs  Lab 11/19/21 0452 11/19/21 1619 11/19/21 2245 11/20/21 0658 11/20/21 0928 11/20/21 1050 11/20/21 1308 11/20/21 1639 11/21/21 0045  WBC 21.1* 19.6* 15.6* 13.6*  --   --  15.7* 18.0* 15.8*  NEUTROABS 17.7* 16.0*  --  10.8*  --   --  14.2*  --  13.6*  HGB 6.8* 6.8* 7.3* 5.2* 7.8* 8.2* 9.1* 9.6* 9.4*  HCT 20.5* 20.0* 22.2* 15.7* 23.0* 24.0* 26.1* 26.4* 26.5*  MCV 85.8 85.8 85.4 85.3  --   --  82.1 79.8* 81.3  PLT 199 228 222 174  --   --  205 229 703    Basic Metabolic Panel: Recent Labs  Lab 11/15/21 0233 11/16/21 1148 11/16/21 2233 11/17/21 0603 11/18/21 0151 11/19/21 0452 11/20/21 0928 11/20/21 1050  NA 130* 131*   < > 132* 130* 130* 131* 129*  K 4.0 5.2*   < > 4.6 5.1 4.4 4.9 5.1  CL 95* 98   < > 96* 94* 95* 93* 93*  CO2 22 20*  --  24 23 25   --   --   GLUCOSE 101* 96   < > 94 92 71 78 82  BUN 21* 47*   < > 37* 49* 39* 66* 69*  CREATININE 3.03* 3.92*   < > 3.08* 4.10* 4.12* 5.40* 5.10*  CALCIUM 7.9* 6.8*  --  7.9* 7.6* 7.6*  --   --   PHOS 3.3 5.8*  --  4.4 5.2* 5.0*  --   --    < > = values in this interval not displayed.    GFR: Estimated Creatinine Clearance: 15.4 mL/min (A) (by C-G formula based on SCr of 5.1 mg/dL (H)). Recent Labs  Lab 11/20/21 0658  11/20/21 1308 11/20/21 1639 11/21/21 0045  WBC 13.6* 15.7* 18.0* 15.8*    Liver Function Tests: Recent Labs  Lab 11/15/21 0233 11/16/21 1148 11/17/21 0603 11/18/21 0151 11/19/21 0452  ALBUMIN 2.1* 1.7* 2.1* 1.9* 1.8*    No results for input(s): LIPASE, AMYLASE in the last 168 hours. No results for input(s): AMMONIA in the last 168 hours.  ABG:    Component Value Date/Time   PHART 7.394 11/05/2021 1043   PCO2ART 44.4 11/05/2021 1043  PO2ART 82 (L) 11/05/2021 1043   HCO3 27.1 11/05/2021 1043   TCO2 25 11/20/2021 1050   ACIDBASEDEF 4.0 (H) 09/09/2016 0931   O2SAT 96.0 11/05/2021 1043     Coagulation Profile: Recent Labs  Lab 11/16/21 1148  INR 1.8*    Cardiac Enzymes: No results for input(s): CKTOTAL, CKMB, CKMBINDEX, TROPONINI in the last 168 hours.  HbA1C: No results found for: HGBA1C  CBG: Recent Labs  Lab 11/16/21 2344 11/17/21 0347 11/17/21 0743 11/20/21 0909 11/20/21 1622  GLUCAP 86 86 92 75 97    Dr. Jose Persia Internal Medicine PGY-3  11/21/2021, 10:51 AM

## 2021-11-21 NOTE — Progress Notes (Addendum)
OT Cancellation Note  Patient Details Name: Timothy [name] MRN: [id number] DOB: 1960-12-09   Cancelled Treatment:    Reason Eval/Treat Not Completed: Patient declined, no reason specified Pt declined any OOB attempts as he had "just started to fall asleep". Requested OT come back at 12 or 1PM today. Of note, this is pt's second consecutive refusal for OT attempts.  Layla Maw 11/21/2021, 9:20 AM

## 2021-11-21 NOTE — Progress Notes (Signed)
Pt educated about the importance of his NG tube. PRN medicine given. The patient then removed his NG tube. Dr. Vaughan Browner made aware.

## 2021-11-21 NOTE — Evaluation (Signed)
Occupational Therapy Evaluation Patient Details Name: Timothy [name] MRN: [id number] DOB: 1961-10-12 Today's Date: 11/21/2021   History of Present Illness 61 y/o male presented to ED on 11/12/21 after being found on floor with rectal bleeding. Multiple recent admissions for CHF volume overload, encephalopathy, pulmonary edema, and GI bleed. CTA showed large pericardial effusion and ascites. EGD on 1/30 demonstrated duodenal ulcer with overlying adherent clot but no signs of active bleeding. Pt underwent exploratory laparotomy and repair of bleeding duodenal ulcer on 2/5.  PMH: ESRD (HD TTS), colon cancer, CHF, polysubstance abuse   Clinical Impression   PTA, pt lives with daughter in 53rd floor apartment (no elevator) and reports typically Independent with ADLs/mobility without AD. Pt presents now with minor deficits in cardiopulmonary endurance, dynamic standing balance and pain. Pt currently requires Setup for UB ADLs, Supervision for LB ADLs and min guard for basic transfers. Pt opted to use RW today for increased stability in standing. Educated on strategies for LB ADLs, optimal body mechanics to protect abdominal incision, and progression of endurance. Anticipate pt to progress well enough to return home without OT follow-up. Will continue to follow acutely.  SpO2 desats to 86% on RA (Kaplan removed on entry), SpO2 94% on 2 L O2      Recommendations for follow up therapy are one component of a multi-disciplinary discharge planning process, led by the attending physician.  Recommendations may be updated based on patient status, additional functional criteria and insurance authorization.   Follow Up Recommendations  No OT follow up    Assistance Recommended at Discharge Set up Supervision/Assistance  Patient can return home with the following Assistance with cooking/housework;Assist for transportation;Help with stairs or ramp for entrance    Functional Status Assessment  Patient has had a recent  decline in their functional status and demonstrates the ability to make significant improvements in function in a reasonable and predictable amount of time.  Equipment Recommendations  Other (comment) (Rolling walker)    Recommendations for Other Services       Precautions / Restrictions Precautions Precautions: Fall;Other (comment) Precaution Comments: monitor O2 (does not wear at baseline), JP drain Restrictions Weight Bearing Restrictions: No      Mobility Bed Mobility Overal bed mobility: Modified Independent                  Transfers Overall transfer level: Needs assistance Equipment used: Rolling walker (2 wheels) Transfers: Sit to/from Stand, Bed to chair/wheelchair/BSC Sit to Stand: Supervision     Step pivot transfers: Min guard            Balance Overall balance assessment: Needs assistance Sitting-balance support: No upper extremity supported, Feet supported Sitting balance-Leahy Scale: Good     Standing balance support: Bilateral upper extremity supported, No upper extremity supported, During functional activity Standing balance-Leahy Scale: Poor Standing balance comment: benefits from RW support to offload pressure                           ADL either performed or assessed with clinical judgement   ADL Overall ADL's : Needs assistance/impaired Eating/Feeding: NPO   Grooming: Set up;Sitting;Wash/dry face   Upper Body Bathing: Set up;Sitting   Lower Body Bathing: Supervison/ safety;Sit to/from stand   Upper Body Dressing : Set up;Sitting   Lower Body Dressing: Supervision/safety Lower Body Dressing Details (indicate cue type and reason): able to reach down easily to remove BP cuff from leg Toilet Transfer: Min guard;Stand-pivot;Rolling walker (2  wheels)   Toileting- Water quality scientist and Hygiene: Supervision/safety;Sit to/from stand         General ADL Comments: minor post op deficits in pain, educated on compensatory  strategies for LB ADLs, body mechanics and protection of incision     Vision Ability to See in Adequate Light: 0 Adequate Patient Visual Report: No change from baseline Vision Assessment?: No apparent visual deficits     Perception     Praxis      Pertinent Vitals/Pain Pain Assessment Pain Assessment: Faces Faces Pain Scale: Hurts a little bit Pain Location: abdominal incision Pain Descriptors / Indicators: Guarding Pain Intervention(s): Monitored during session     Hand Dominance Right   Extremity/Trunk Assessment Upper Extremity Assessment Upper Extremity Assessment: Overall WFL for tasks assessed   Lower Extremity Assessment Lower Extremity Assessment: Defer to PT evaluation   Cervical / Trunk Assessment Cervical / Trunk Assessment: Normal   Communication Communication Communication: No difficulties   Cognition Arousal/Alertness: Awake/alert Behavior During Therapy: WFL for tasks assessed/performed, Flat affect Overall Cognitive Status: Within Functional Limits for tasks assessed                                 General Comments: likely at baseline, some decreased awareness of deficits/medical complexities as he pulled NG suction tube out of this tube and asking for ice chips though NPO     General Comments       Exercises     Shoulder Instructions      Home Living Family/patient expects to be discharged to:: Private residence Living Arrangements: Children Available Help at Discharge: Family;Available PRN/intermittently Type of Home: Apartment Home Access: Stairs to enter Entrance Stairs-Number of Steps: 2 flights to 3rd story apt Entrance Stairs-Rails: Left Home Layout: One level     Bathroom Shower/Tub: Teacher, early years/pre: Standard Bathroom Accessibility: Yes   Home Equipment: Marine scientist - single point   Additional Comments: reports he lives with daughter (51 y/o) who works      Prior  Functioning/Environment Prior Level of Function : Independent/Modified Independent             Mobility Comments: Typically independent without DME; physical activity limited by HD (pt reports typically worn out post-HD and day after HD). No longer drives, does not work ADLs Comments: Reports Independent with ADLs, IADls in the home. assist with transportation to HD        OT Problem List: Decreased activity tolerance;Impaired balance (sitting and/or standing);Pain;Cardiopulmonary status limiting activity      OT Treatment/Interventions: Self-care/ADL training;Therapeutic exercise;Energy conservation;DME and/or AE instruction;Therapeutic activities;Patient/family education;Balance training    OT Goals(Current goals can be found in the care plan section) Acute Rehab OT Goals Patient Stated Goal: have some ice chips OT Goal Formulation: With patient Time For Goal Achievement: 12/05/21 Potential to Achieve Goals: Good  OT Frequency: Min 2X/week    Co-evaluation              AM-PAC OT "6 Clicks" Daily Activity     Outcome Measure Help from another person eating meals?: Total (NPO) Help from another person taking care of personal grooming?: A Little Help from another person toileting, which includes using toliet, bedpan, or urinal?: A Little Help from another person bathing (including washing, rinsing, drying)?: A Little Help from another person to put on and taking off regular upper body clothing?: A Little Help from another person to put on  and taking off regular lower body clothing?: A Little 6 Click Score: 16   End of Session Equipment Utilized During Treatment: Rolling walker (2 wheels);Oxygen Nurse Communication: Mobility status;Other (comment) (request for ice chips)  Activity Tolerance: Patient tolerated treatment well Patient left: in chair;with call bell/phone within reach;with chair alarm set  OT Visit Diagnosis: Other abnormalities of gait and mobility  (R26.89);Pain Pain - part of body:  (abdomen)                Time: 4301-4840 OT Time Calculation (min): 27 min Charges:  OT General Charges $OT Visit: 1 Visit OT Evaluation $OT Eval Moderate Complexity: 1 Mod OT Treatments $Self Care/Home Management : 8-22 mins  Malachy Chamber, OTR/L Acute Rehab Services Office: [phone]   Layla Maw 11/21/2021, 12:34 PM

## 2021-11-21 NOTE — Progress Notes (Signed)
Patient has been taking his oxygen off, education done on need to keep o2 saturation above 92%.  States it itches when taking it off.

## 2021-11-21 NOTE — Progress Notes (Signed)
Subjective He removed his NG on his own accord and states he no longer wishes to have it. Discussed importance and he clearly understands. Also discussed importance of his JP which he has agreed to keep in place. Discussed nothing by mouth at this juncture  Objective: Vital signs in last 24 hours: Temp:  [97.5 F (36.4 C)-98.4 F (36.9 C)] 98.2 F (36.8 C) (02/06 0700) Pulse Rate:  [89-97] 93 (02/06 0600) Resp:  [10-27] 14 (02/06 0600) BP: (93-154)/(53-93) 127/69 (02/06 0600) SpO2:  [85 %-99 %] 94 % (02/06 0600) Weight:  [70.5 kg-70.7 kg] 70.5 kg (02/06 0630) Last BM Date: 11/20/21  Intake/Output from previous day: 02/05 0701 - 02/06 0700 In: 2013 [I.V.:1000; Blood:913; IV Piggyback:100] Out: 2395 [Drains:145; Blood:200] Intake/Output this shift: No intake/output data recorded.  Gen: NAD, comfortable CV: RRR Pulm: Normal work of breathing Abd: Soft, appropriate incisional tenderness, not significantly distended. JP thin serosang Ext: SCDs in place  Lab Results: CBC  Recent Labs    11/20/21 1639 11/21/21 0045  WBC 18.0* 15.8*  HGB 9.6* 9.4*  HCT 26.4* 26.5*  PLT 229 248   BMET Recent Labs    11/19/21 0452 11/20/21 0928 11/20/21 1050  NA 130* 131* 129*  K 4.4 4.9 5.1  CL 95* 93* 93*  CO2 25  --   --   GLUCOSE 71 78 82  BUN 39* 66* 69*  CREATININE 4.12* 5.40* 5.10*  CALCIUM 7.6*  --   --    PT/INR No results for input(s): LABPROT, INR in the last 72 hours. ABG No results for input(s): PHART, HCO3 in the last 72 hours.  Invalid input(s): PCO2, PO2  Studies/Results:  Anti-infectives: Anti-infectives (From admission, onward)    Start     Dose/Rate Route Frequency Ordered Stop   11/20/21 0930  ceFAZolin (ANCEF) IVPB 2g/100 mL premix       See Hyperspace for full Linked Orders Report.   2 g 200 mL/hr over 30 Minutes Intravenous On call to O.R. 11/20/21 0831 11/20/21 1005   11/20/21 0930  metroNIDAZOLE (FLAGYL) IVPB 500 mg       See Hyperspace for  full Linked Orders Report.   500 mg 100 mL/hr over 60 Minutes Intravenous On call to O.R. 11/20/21 0831 11/20/21 1010        Assessment/Plan: Patient Active Problem List   Diagnosis Date Noted   Hemorrhagic shock ([place])    Rectal bleeding    Duodenal ulcer with hemorrhage    Upper GI bleed 11/05/2021   GI bleed 11/05/2021   ESRD (end stage renal disease) on dialysis ([place]) 11/01/2021   Hyperkalemia 11/01/2021   Volume overload 10/31/2021   SOB (shortness of breath) 10/31/2021   Acute hyponatremia 10/31/2021   Multifocal pneumonia 10/06/2021   Hypervolemia associated with renal insufficiency 10/05/2021   Pericardial effusion without cardiac tamponade 10/05/2021   Chronic combined systolic and diastolic CHF (congestive heart failure) ([place]) 10/05/2021   Elevated procalcitonin 10/05/2021   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/2021   Acute postoperative pain of abdomen    Renal mass    Major depressive disorder, single episode, severe ([place]) 06/18/2021   Bacterial peritonitis ([place])    Other ascites    Protein-calorie malnutrition, severe 06/13/2021   Ileus ([place]) 06/11/2021   Small bowel obstruction ([place]) 05/20/2021   DOE (dyspnea on exertion) 10/25/2020   Orthopnea 10/25/2020   Open wound of left hand 01/18/2018   Tibial fracture 01/14/2018   Tibial plateau fracture, left 01/13/2018   Malnutrition of  moderate degree 09/20/2016   AKI (acute kidney injury) ([place])    History of colon cancer    Hyponatremia    Leukocytosis    Acute blood loss anemia    Acute metabolic encephalopathy 00/17/4944   ESRD on hemodialysis ([place]) 09/09/2016   Hypertensive emergency 09/09/2016   Acute respiratory failure with hypoxia ([place]) 09/09/2016   Acute pulmonary edema ([place]) 09/09/2016   Substance abuse ([place]) 09/09/2016   Nonadherence to medical treatment 09/09/2016   Anemia due to chronic kidney disease 09/09/2016   Altered mental status 09/09/2016   Acute respiratory failure ([place])    Drug  ingestion    Hypertensive heart and chronic kidney disease with heart failure and with stage 5 chronic kidney disease, or end stage renal disease ([place]) 07/19/2016   Chest pain    Elevated troponin 96/75/9163   Acute diastolic heart failure, NYHA class 2 ([place]) 07/17/2016   Heroin abuse ([place]) 07/17/2016   HTN (hypertension) 07/17/2016   Benign prostate hyperplasia 07/16/2015   s/p Procedure(s): EXPLORATORY LAPAROTOMY, REPAIR OF BLEEDING DUODENAL ULCER 11/20/2021  -Doing reasonably well - discussed importance of participating in his care, maintaining drainage tubes, npo, etc. He has expressed understanding -NPO; only things to be taken by mouth are his carafate; no ice/water since his NG is no longer in place -JP to bulb suction -BID PPI -Ambulate 5x/day - PT/OT -Ppx: SCDs; holding chemcical dvt ppx until we are certain there is no further bleeding - will readdress 2/7  -We have spent time reviewing his procedure, findings and plans moving forward. His questions were answered   LOS: 9 days   Nadeen Landau, MD [facility], [place]

## 2021-11-21 NOTE — TOC Progression Note (Signed)
Transition of Care [facility]) - Initial/Assessment Note    Patient Details  Name: Timothy [name] MRN: [id number] Date of Birth: 06/25/1961  Transition of Care [facility]) CM/SW Contact:    Milinda Antis, [place] Phone Number: 11/21/2021, 1:09 PM  Clinical Narrative:                 CSW informed by RN that the patient requested to speak with CSW.  CSW spoke with the patient who asked about disability and an apartment.  CSW gave the patient resources for housing (patient currently lives with his daughter) and encouraged the patient to contact the social security administration for questions in reference to his disability.    Expected Discharge Plan: Skilled Nursing Facility Barriers to Discharge: Continued Medical Work up   Patient Goals and CMS Choice        Expected Discharge Plan and Services Expected Discharge Plan: [place]       Living arrangements for the past 2 months: Apartment                                      Prior Living Arrangements/Services Living arrangements for the past 2 months: Apartment Lives with:: Spouse, Self Patient language and need for interpreter reviewed:: Yes Do you feel safe going back to the place where you live?: Yes      Need for Family Participation in Patient Care: Yes (Comment) Care giver support system in place?: Yes (comment)   Criminal Activity/Legal Involvement Pertinent to Current Situation/Hospitalization: No - Comment as needed  Activities of Daily Living Home Assistive Devices/Equipment: None ADL Screening (condition at time of admission) Patient's cognitive ability adequate to safely complete daily activities?: Yes Is the patient deaf or have difficulty hearing?: No Does the patient have difficulty seeing, even when wearing glasses/contacts?: No Does the patient have difficulty concentrating, remembering, or making decisions?: No Patient able to express need for assistance with ADLs?: Yes Does the patient  have difficulty dressing or bathing?: No Independently performs ADLs?: Yes (appropriate for developmental age) Does the patient have difficulty walking or climbing stairs?: No Weakness of Legs: None Weakness of Arms/Hands: None  Permission Sought/Granted                  Emotional Assessment Appearance:: Appears older than stated age Attitude/Demeanor/Rapport: Guarded Affect (typically observed): Withdrawn Orientation: : Oriented to Self, Oriented to Place, Oriented to  Time, Oriented to Situation Alcohol / Substance Use: Illicit Drugs Psych Involvement: No (comment)  Admission diagnosis:  Rectal bleeding [K62.5] GI bleed [K92.2] ESRD (end stage renal disease) on dialysis ([place]) [N18.6, Z99.2] Patient Active Problem List   Diagnosis Date Noted   Hemorrhagic shock ([place])    Rectal bleeding    Duodenal ulcer with hemorrhage    Upper GI bleed 11/05/2021   GI bleed 11/05/2021   ESRD (end stage renal disease) on dialysis ([place]) 11/01/2021   Hyperkalemia 11/01/2021   Volume overload 10/31/2021   SOB (shortness of breath) 10/31/2021   Acute hyponatremia 10/31/2021   Multifocal pneumonia 10/06/2021   Hypervolemia associated with renal insufficiency 10/05/2021   Pericardial effusion without cardiac tamponade 10/05/2021   Chronic combined systolic and diastolic CHF (congestive heart failure) ([place]) 10/05/2021   Elevated procalcitonin 10/05/2021   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/2021   Acute postoperative pain of abdomen    Renal mass    Major depressive disorder, single episode, severe ([place])  06/18/2021   Bacterial peritonitis ([place])    Other ascites    Protein-calorie malnutrition, severe 06/13/2021   Ileus ([place]) 06/11/2021   Small bowel obstruction ([place]) 05/20/2021   DOE (dyspnea on exertion) 10/25/2020   Orthopnea 10/25/2020   Open wound of left hand 01/18/2018   Tibial fracture 01/14/2018   Tibial plateau fracture, left 01/13/2018   Malnutrition of moderate degree  09/20/2016   AKI (acute kidney injury) ([place])    History of colon cancer    Hyponatremia    Leukocytosis    Acute blood loss anemia    Acute metabolic encephalopathy 74/82/7078   ESRD on hemodialysis ([place]) 09/09/2016   Hypertensive emergency 09/09/2016   Acute respiratory failure with hypoxia ([place]) 09/09/2016   Acute pulmonary edema ([place]) 09/09/2016   Substance abuse ([place]) 09/09/2016   Nonadherence to medical treatment 09/09/2016   Anemia due to chronic kidney disease 09/09/2016   Altered mental status 09/09/2016   Acute respiratory failure ([place])    Drug ingestion    Hypertensive heart and chronic kidney disease with heart failure and with stage 5 chronic kidney disease, or end stage renal disease ([place]) 07/19/2016   Chest pain    Elevated troponin 67/54/4920   Acute diastolic heart failure, NYHA class 2 ([place]) 07/17/2016   Heroin abuse ([place]) 07/17/2016   HTN (hypertension) 07/17/2016   Benign prostate hyperplasia 07/16/2015   PCP:  [name], MD Pharmacy:   [facility], [place] [address] Phone: [phone] Fax: [place], [place] AT [employer] [place] [address] Phone: [phone] Fax: [phone]     Social Determinants of Health (SDOH) Interventions    Readmission Risk Interventions Readmission Risk Prevention Plan 11/16/2021 11/09/2021  Transportation Screening - Complete  Medication Review (RN Care Manager) Referral to Pharmacy Referral to Pharmacy  PCP or Specialist appointment within 3-5 days of discharge Not Complete Complete  PCP/Specialist Appt Not Complete comments patient not medically ready to d/c -  [place] or Home Care Consult Not Complete Patient refused  [place] or Home Care Consult Pt Refusal Comments not complete, waiting on PT recs -  [facility] Recovery  Care/Counseling Consult Complete Complete  Palliative Care Screening Not Applicable Not Applicable  Skilled Nursing Facility Not Complete Not Applicable  SNF Comments Pending PT recommendations (CSW following) -  Some recent data might be hidden

## 2021-11-21 NOTE — Progress Notes (Signed)
PROGRESS NOTE                                                                                                                                                                                                             Patient Demographics:    Timothy [name], is a 61 y.o. male, DOB - 11-Feb-1961, OIZ:[id number]  Outpatient Primary MD for the patient is [name], MD    LOS - 9  Admit date - 11/12/2021    Chief Complaint  Patient presents with   Rectal Bleeding       Brief Narrative (HPI from H&P)   61 year old gentleman with history of ESRD TTS schedule, GERD with duodenal ulcer, hypertension, chronic pain on oxycodone who was brought in by EMS after he was found to have large rectal bleeding with acute blood loss related anemia, he was found to have a hemoglobin of 5.1 in the ER and admitted to ICU.  He required multiple units of packed RBC transfusion, he was seen by GI underwent EGD on 11/16/2021 showing a duodenal ulcer with spurting blood and adherent clot, IR was consulted and he subsequently underwent embolization on 11/16/2021.  H&H stabilized somewhat and he was transferred to hospitalist service on 11/19/2021.  This morning his H&H has dropped a unit again however no signs of active ongoing bleeding will be monitored closely.   Subjective:   Patient in bed denies any headache chest pain or shortness of breath, mild abdominal discomfort, not passing flatus, no focal weakness.   Assessment  & Plan :    Assessment and Plan: No notes have been filed under this hospital service. Service: Hospitalist   Acute blood loss related anemia due to upper GI acute duodenal bleed - he has received multiple units of packed RBCs in ICU, as well as getting 3 units of packed RBC between 11/19/2021 and 11/20/2018.  He is s/p EGD showing actively bleeding duodenal ulcer he is s/p embolization by IR on 11/16/2021.  Unfortunately night of  11/19/2021 he had a massive bloody bowel movement after which his H&H dropped again, requiring multiple units of packed RBC transfusion, general surgery saw the patient and took him to the OR for emergent laparotomy with suturing of his duodenal ulcer on 11/20/2021.  H&H now stable, continue to monitor on IV PPI.  2.  ESRD.  On  TTS schedule.  He is due for HD nephrology team has been informed on 11/20/2021  3.  Paroxysmal A. fib RVR.  This is new finding in the setting of severe anemia and acute illness.  He was briefly on diltiazem drip.  He is since converted to sinus rhythm in ICU and has been transition out to my service in sinus.  His [place] vas 2 score will be greater than 2.  However at this time extremely poor candidate for anticoagulation due to #1 above.  If remains stable will have him follow-up with cardiology outpatient within 1 to 2 weeks of discharge.  Currently stable in sinus rhythm.  4.  HTN.  Blood pressure is stabilizing we will place him on IV Lopressor and hydralazine due to n.p.o. status.  5.  Past history of colon cancer.  Follow with PCP and primary GI post discharge.  6.  Chronic pain and remote history of substance abuse.  Monitor with supportive care.      Condition - Extremely Guarded  Family Communication  :  None present  Code Status :  Full  Consults  :  Renal, GI, IR, PCCM, CCS  PUD Prophylaxis : PPI   Procedures  :     IR - Angiogram 11/16/21 - 1. No evidence of active extravasation about the duodenum, however multifocal vessel irregularity was observed. 2. Technically successful Gel-Foam slurry embolization of the anterior and posterior pancreaticoduodenal arcades.      Disposition Plan  :    Status is: Inpatient - GI Bleed   DVT Prophylaxis  :    Place TED hose Start: 11/19/21 0900 SCDs Start: 11/12/21 2005   Lab Results  Component Value Date   PLT 248 11/21/2021    Diet :  Diet Order             Diet NPO time specified  Diet effective now                     Inpatient Medications  Scheduled Meds:  sodium chloride   Intravenous Once   sodium chloride   Intravenous Once   Chlorhexidine Gluconate Cloth  6 each Topical Daily   doxercalciferol  2 mcg Intravenous Q T,Th,Sa-HD   pantoprazole (PROTONIX) IV  40 mg Intravenous Q12H   sucralfate  1 g Oral TID WC & HS   Continuous Infusions:  sodium chloride 10 mL/hr (11/16/21 1200)   sodium chloride     sodium chloride     PRN Meds:.fentaNYL (SUBLIMAZE) injection, hydrALAZINE, HYDROmorphone (DILAUDID) injection, ipratropium-albuterol  Antibiotics  :    Anti-infectives (From admission, onward)    Start     Dose/Rate Route Frequency Ordered Stop   11/20/21 0930  ceFAZolin (ANCEF) IVPB 2g/100 mL premix       See Hyperspace for full Linked Orders Report.   2 g 200 mL/hr over 30 Minutes Intravenous On call to O.R. 11/20/21 0831 11/20/21 1005   11/20/21 0930  metroNIDAZOLE (FLAGYL) IVPB 500 mg       See Hyperspace for full Linked Orders Report.   500 mg 100 mL/hr over 60 Minutes Intravenous On call to O.R. 11/20/21 0831 11/20/21 1010        Time Spent in minutes  30   Lala Lund M.D on 11/21/2021 at 10:08 AM  To page go to [email or web address]   [facility] -  Office  [phone]  See all Orders from today for further details    Objective:   Vitals:  11/21/21 0600 11/21/21 0630 11/21/21 0700 11/21/21 0946  BP: 127/69     Pulse: 93     Resp: 14     Temp:   98.2 F (36.8 C)   TempSrc:   Oral   SpO2: 94%   (P) 95%  Weight:  70.5 kg    Height:        Wt Readings from Last 3 Encounters:  11/21/21 70.5 kg  11/08/21 71.5 kg  11/02/21 66.8 kg     Intake/Output Summary (Last 24 hours) at 11/21/2021 1008 Last data filed at 11/21/2021 0600 Gross per 24 hour  Intake 2013 ml  Output 2395 ml  Net -382 ml     Physical Exam  Awake Alert, No new F.N deficits, Normal affect [place].AT,PERRAL Supple Neck, No JVD,   Symmetrical Chest wall movement, Good  air movement bilaterally, CTAB RRR,No Gallops, Rubs or new Murmurs,  Hypoactive bowel sounds, midline abdominal incision site under bandage, JP drain in place No Cyanosis, Clubbing or edema    RN pressure injury documentation: Pressure Injury 11/12/21 Coccyx Right;Upper Stage 2 -  Partial thickness loss of dermis presenting as a shallow open injury with a red, pink wound bed without slough. oval shaped opening with smooth edges and pink/red center (Active)  11/12/21 2100  Location: Coccyx  Location Orientation: Right;Upper  Staging: Stage 2 -  Partial thickness loss of dermis presenting as a shallow open injury with a red, pink wound bed without slough.  Wound Description (Comments): oval shaped opening with smooth edges and pink/red center  Present on Admission: Yes     Data Review:    CBC Recent Labs  Lab 11/19/21 0452 11/19/21 1619 11/19/21 2245 11/20/21 0658 11/20/21 0928 11/20/21 1050 11/20/21 1308 11/20/21 1639 11/21/21 0045  WBC 21.1* 19.6* 15.6* 13.6*  --   --  15.7* 18.0* 15.8*  HGB 6.8* 6.8* 7.3* 5.2* 7.8* 8.2* 9.1* 9.6* 9.4*  HCT 20.5* 20.0* 22.2* 15.7* 23.0* 24.0* 26.1* 26.4* 26.5*  PLT 199 228 222 174  --   --  205 229 248  MCV 85.8 85.8 85.4 85.3  --   --  82.1 79.8* 81.3  MCH 28.5 29.2 28.1 28.3  --   --  28.6 29.0 28.8  MCHC 33.2 34.0 32.9 33.1  --   --  34.9 36.4* 35.5  RDW 20.3* 21.1* 19.9* 18.4*  --   --  17.2* 17.5* 18.3*  LYMPHSABS 1.0 0.9  --  0.8  --   --  0.4*  --  0.5*  MONOABS 2.0* 2.3*  --  1.8*  --   --  0.9  --  1.6*  EOSABS 0.1 0.1  --  0.1  --   --  0.1  --  0.0  BASOSABS 0.0 0.0  --  0.0  --   --  0.0  --  0.0    Electrolytes Recent Labs  Lab 11/15/21 0233 11/16/21 1148 11/16/21 2233 11/17/21 0603 11/18/21 0151 11/19/21 0452 11/19/21 0950 11/20/21 0928 11/20/21 1050  NA 130* 131*   < > 132* 130* 130*  --  131* 129*  K 4.0 5.2*   < > 4.6 5.1 4.4  --  4.9 5.1  CL 95* 98   < > 96* 94* 95*  --  93* 93*  CO2 22 20*  --  24 23 25    --   --   --   GLUCOSE 101* 96   < > 94 92 71  --  78  82  BUN 21* 47*   < > 37* 49* 39*  --  66* 69*  CREATININE 3.03* 3.92*   < > 3.08* 4.10* 4.12*  --  5.40* 5.10*  CALCIUM 7.9* 6.8*  --  7.9* 7.6* 7.6*  --   --   --   ALBUMIN 2.1* 1.7*  --  2.1* 1.9* 1.8*  --   --   --   INR  --  1.8*  --   --   --   --   --   --   --   TSH  --   --   --   --   --   --  3.168  --   --    < > = values in this interval not displayed.    ------------------------------------------------------------------------------------------------------------------ No results for input(s): CHOL, HDL, LDLCALC, TRIG, CHOLHDL, LDLDIRECT in the last 72 hours.  No results found for: HGBA1C  Recent Labs    11/19/21 0950  TSH 3.168   ------------------------------------------------------------------------------------------------------------------ ID Labs Recent Labs  Lab 11/17/21 0603 11/17/21 0604 11/18/21 0151 11/18/21 1621 11/19/21 0452 11/19/21 1619 11/19/21 2245 11/20/21 3710 11/20/21 0928 11/20/21 1050 11/20/21 1308 11/20/21 1639 11/21/21 0045  WBC  --    < >  --    < > 21.1*   < > 15.6* 13.6*  --   --  15.7* 18.0* 15.8*  PLT  --    < >  --    < > 199   < > 222 174  --   --  205 229 248  CREATININE 3.08*  --  4.10*  --  4.12*  --   --   --  5.40* 5.10*  --   --   --    < > = values in this interval not displayed.   Cardiac Enzymes No results for input(s): CKMB, TROPONINI, MYOGLOBIN in the last 168 hours.  Invalid input(s): CK  Radiology Reports CT ANGIO GI BLEED  Result Date: 11/20/2021 CLINICAL DATA:  Upper GI bleed common non variceal. Arterial source on endoscopy. EXAM: CTA ABDOMEN AND PELVIS WITHOUT AND WITH CONTRAST TECHNIQUE: Multidetector CT imaging of the abdomen and pelvis was performed using the standard protocol during bolus administration of intravenous contrast. Multiplanar reconstructed images and MIPs were obtained and reviewed to evaluate the vascular anatomy. RADIATION DOSE  REDUCTION: This exam was performed according to the departmental dose-optimization program which includes automated exposure control, adjustment of the mA and/or kV according to patient size and/or use of iterative reconstruction technique. CONTRAST:  11mL OMNIPAQUE IOHEXOL 350 MG/ML SOLN COMPARISON:  11/15/2021, 06/21/2021. FINDINGS: VASCULAR Aorta: Aortic atherosclerosis. Normal caliber aorta without aneurysm, dissection, vasculitis or significant stenosis. Celiac: Patent without evidence of aneurysm, dissection, vasculitis or significant stenosis. Metallic coils are present in the region of the gastroduodenal artery. SMA: Patent without evidence of aneurysm, dissection, vasculitis or significant stenosis. Renals: Mild atherosclerotic calcification. Both renal arteries are patent without evidence of aneurysm, dissection, vasculitis, fibromuscular dysplasia or significant stenosis. IMA: Patent without evidence of aneurysm, dissection, vasculitis or significant stenosis. Inflow: Atherosclerotic calcifications. Patent without evidence of aneurysm, dissection, vasculitis or significant stenosis. Proximal Outflow: Atherosclerotic calcifications. Bilateral common femoral and visualized portions of the superficial and profunda femoral arteries are patent without evidence of aneurysm, dissection, vasculitis or significant stenosis. Veins: No obvious venous abnormality within the limitations of this arterial phase study. Review of the MIP images confirms the above findings. NON-VASCULAR Lower chest: The heart is enlarged and there is  a moderate pericardial effusion measuring up to 1.7 cm. Strandy atelectasis or infiltrate is present at the lung bases. There is a trace left pleural effusion. Hepatobiliary: No focal liver abnormality. A stone is present within the gallbladder. The gallbladder is contracted and gallbladder wall thickening is noted. No biliary ductal dilatation. Pancreas: There is mild dilatation of the  pancreatic duct measuring 4 mm, not significantly changed from the prior exam. No local inflammatory changes. Spleen: The spleen is normal in size. There is a stable hypodensity in the spleen measuring 2 cm. Adrenals/Urinary Tract: The adrenal glands are within normal limits. Bilateral renal atrophy is noted. There is a complex hypodense mass in the right kidney measuring 2.9 cm. Stable subcentimeter hypodensities are noted in the kidneys bilaterally. No hydronephrosis. Excreted contrast is present in the urinary bladder. Stomach/Bowel: The stomach is partially distended. Evaluation for hemorrhage is limited in the presence of oral contrast. No bowel obstruction, free air, or pneumatosis. Right hemicolectomy changes are present. Lymphatic: Evaluation for lymphadenopathy is limited due to ascites. Reproductive: Prostate is unremarkable. Other: Moderate ascites is noted. Musculoskeletal: No acute osseous abnormality. IMPRESSION: VASCULAR 1. No evidence of active GI hemorrhage. 2. Coil embolization of the gastroduodenal artery. 3. Aortic atherosclerosis with no significant stenosis or aneurysm. NON-VASCULAR 1. Stable moderate ascites. 2. Cardiomegaly with moderate pericardial effusion. 3. Cholelithiasis. 4. Strandy atelectasis or infiltrate at the lung bases. Small left pleural effusion. 5. Bilateral renal atrophy with multiple stable hypodensities. There is a complex lesion in the mid right kidney, stable from prior exams and previously characterized as a hemorrhagic or proteinaceous cyst on MRI. Electronically Signed   By: Brett Fairy M.D.   On: 11/20/2021 04:39

## 2021-11-21 NOTE — Progress Notes (Signed)
Pt states that he will not wear oxygen probe or oxygen nasal cannula if he can't have ice chips. RN educated patient. Dr. Vaughan Browner made aware.

## 2021-11-22 LAB — CBC
HCT: 24.2 % — ABNORMAL LOW (ref 39.0–52.0)
Hemoglobin: 7.9 g/dL — ABNORMAL LOW (ref 13.0–17.0)
MCH: 27.5 pg (ref 26.0–34.0)
MCHC: 32.6 g/dL (ref 30.0–36.0)
MCV: 84.3 fL (ref 80.0–100.0)
Platelets: 284 10*3/uL (ref 150–400)
RBC: 2.87 MIL/uL — ABNORMAL LOW (ref 4.22–5.81)
RDW: 18.6 % — ABNORMAL HIGH (ref 11.5–15.5)
WBC: 12.8 10*3/uL — ABNORMAL HIGH (ref 4.0–10.5)
nRBC: 0 % (ref 0.0–0.2)

## 2021-11-22 LAB — BASIC METABOLIC PANEL
Anion gap: 14 (ref 5–15)
BUN: 53 mg/dL — ABNORMAL HIGH (ref 6–20)
CO2: 23 mmol/L (ref 22–32)
Calcium: 7.4 mg/dL — ABNORMAL LOW (ref 8.9–10.3)
Chloride: 95 mmol/L — ABNORMAL LOW (ref 98–111)
Creatinine, Ser: 5.26 mg/dL — ABNORMAL HIGH (ref 0.61–1.24)
GFR, Estimated: 12 mL/min — ABNORMAL LOW (ref >60)
Glucose, Bld: 70 mg/dL (ref 70–99)
Potassium: 4.9 mmol/L (ref 3.5–5.1)
Sodium: 132 mmol/L — ABNORMAL LOW (ref 135–145)

## 2021-11-22 LAB — PHOSPHORUS: Phosphorus: 7.2 mg/dL — ABNORMAL HIGH (ref 2.5–4.6)

## 2021-11-22 MED ORDER — DARBEPOETIN ALFA 150 MCG/0.3ML IJ SOSY
150.0000 ug | PREFILLED_SYRINGE | INTRAMUSCULAR | Status: DC
  Administered 2021-11-26: 150 ug via INTRAVENOUS
  Filled 2021-11-22 (×2): qty 0.3

## 2021-11-22 NOTE — Progress Notes (Signed)
PROGRESS NOTE                                                                                                                                                                                                             Patient Demographics:    Timothy [name], is a 61 y.o. male, DOB - 1961/05/01, WUJ:[id number]  Outpatient Primary MD for the patient is Osei-Bonsu, Iona Beard, MD    LOS - 10  Admit date - 11/12/2021    Chief Complaint  Patient presents with   Rectal Bleeding       Brief Narrative (HPI from H&P)   61 year old gentleman with history of ESRD TTS schedule, GERD with duodenal ulcer, hypertension, chronic pain on oxycodone who was brought in by EMS after he was found to have large rectal bleeding with acute blood loss related anemia, he was found to have a hemoglobin of 5.1 in the ER and admitted to ICU.  He required multiple units of packed RBC transfusion, he was seen by GI underwent EGD on 11/16/2021 showing a duodenal ulcer with spurting blood and adherent clot, IR was consulted and he subsequently underwent embolization on 11/16/2021.  H&H stabilized somewhat and he was transferred to hospitalist service on 11/19/2021.  This morning his H&H has dropped a unit again however no signs of active ongoing bleeding will be monitored closely.   Subjective:   Patient in bed, appears comfortable, denies any headache, no fever, no chest pain or pressure, no shortness of breath , some postop abdominal pain and discomfort, not passing flatus yet.  No focal weakness.    Assessment  & Plan :    Assessment and Plan: No notes have been filed under this hospital service. Service: Hospitalist   Acute blood loss related anemia due to upper GI acute duodenal bleed - he has received multiple units of packed RBCs in ICU, as well as getting 3 units of packed RBC between 11/19/2021 and 11/20/2018.  He is s/p EGD showing actively bleeding duodenal  ulcer he is s/p embolization by IR on 11/16/2021.  Unfortunately night of 11/19/2021 he had a massive bloody bowel movement after which his H&H dropped again, requiring multiple units of packed RBC transfusion, general surgery saw the patient and took him to the OR for emergent laparotomy with suturing of his duodenal ulcer on 11/20/2021.  He appears stable clinically however he removed his NG tube on 11/21/2021 after counseling agreed for replacement unfortunately last night and this morning he has refused his blood work, which counseling he has now agreed to get 1 set of blood work done this morning which have been ordered, will monitor clinically, defer management of this issue to CCS and GI, transfuse as needed from our standpoint.   2.  ESRD.  On TTS schedule.  He is due for HD nephrology team is following.  3.  Paroxysmal A. fib RVR.  This is new finding in the setting of severe anemia and acute illness.  He was briefly on diltiazem drip.  He is since converted to sinus rhythm in ICU and has been transition out to my service in sinus.  His [place] vas 2 score will be greater than 2.  However at this time extremely poor candidate for anticoagulation due to #1 above.  If remains stable will have him follow-up with cardiology outpatient within 1 to 2 weeks of discharge.  Currently stable in sinus rhythm.  4.  HTN.  Blood pressure is stabilizing we will place him on IV Lopressor and hydralazine due to n.p.o. status.  5.  Past history of colon cancer.  Follow with PCP and primary GI post discharge.  6.  Chronic pain and remote history of substance abuse.  Monitor with supportive care.       Condition - Extremely Guarded  Family Communication  : Called wife Angela Nevin [phone] on 11/22/2021 at 8:54 AM - message left  Code Status :  Full  Consults  :  Renal, GI, IR, PCCM, CCS  PUD Prophylaxis : PPI   Procedures  :     IR - Angiogram 11/16/21 - 1. No evidence of active extravasation about the duodenum,  however multifocal vessel irregularity was observed. 2. Technically successful Gel-Foam slurry embolization of the anterior and posterior pancreaticoduodenal arcades.      Disposition Plan  :    Status is: Inpatient - GI Bleed   DVT Prophylaxis  :    Place TED hose Start: 11/19/21 0900 SCDs Start: 11/12/21 2005   Lab Results  Component Value Date   PLT 248 11/21/2021    Diet :  Diet Order             Diet NPO time specified Except for: Ice Chips  Diet effective now                    Inpatient Medications  Scheduled Meds:  sodium chloride   Intravenous Once   Chlorhexidine Gluconate Cloth  6 each Topical Daily   doxercalciferol  2 mcg Intravenous Q T,Th,Sa-HD   pantoprazole (PROTONIX) IV  40 mg Intravenous Q12H   sucralfate  1 g Oral TID WC & HS   Continuous Infusions:  sodium chloride     PRN Meds:.hydrALAZINE, HYDROmorphone (DILAUDID) injection, ipratropium-albuterol, metoprolol tartrate  Antibiotics  :    Anti-infectives (From admission, onward)    Start     Dose/Rate Route Frequency Ordered Stop   11/20/21 0930  ceFAZolin (ANCEF) IVPB 2g/100 mL premix       See Hyperspace for full Linked Orders Report.   2 g 200 mL/hr over 30 Minutes Intravenous On call to O.R. 11/20/21 0831 11/20/21 1005   11/20/21 0930  metroNIDAZOLE (FLAGYL) IVPB 500 mg       See Hyperspace for full Linked Orders Report.   500 mg 100 mL/hr over 60 Minutes Intravenous  On call to O.R. 11/20/21 0831 11/20/21 1010        Time Spent in minutes  30   Lala Lund M.D on 11/22/2021 at 8:51 AM  To page go to [email or web address]   [facility] -  Office  [phone]  See all Orders from today for further details    Objective:   Vitals:   11/22/21 0000 11/22/21 0056 11/22/21 0314 11/22/21 0406  BP: (!) 157/78 (!) 142/78  (!) 147/79  Pulse: 99 (!) 101  98  Resp: 16 20  18   Temp:  98.6 F (37 C)  97.8 F (36.6 C)  TempSrc:  Oral  Oral  SpO2: 97% 93%  93%  Weight:    74 kg   Height:        Wt Readings from Last 3 Encounters:  11/22/21 74 kg  11/08/21 71.5 kg  11/02/21 66.8 kg     Intake/Output Summary (Last 24 hours) at 11/22/2021 0851 Last data filed at 11/21/2021 1800 Gross per 24 hour  Intake 175 ml  Output 130 ml  Net 45 ml     Physical Exam  Awake Alert, No new F.N deficits, Normal affect [place].AT,PERRAL Supple Neck, No JVD,   Symmetrical Chest wall movement, Good air movement bilaterally, CTAB RRR,No Gallops, Rubs or new Murmurs,  Hypoactive bowel sounds, midline abdominal incision site under bandage, NG, JP drain in place  No Cyanosis, Clubbing or edema     RN pressure injury documentation: Pressure Injury 11/12/21 Coccyx Right;Upper Stage 2 -  Partial thickness loss of dermis presenting as a shallow open injury with a red, pink wound bed without slough. oval shaped opening with smooth edges and pink/red center (Active)  11/12/21 2100  Location: Coccyx  Location Orientation: Right;Upper  Staging: Stage 2 -  Partial thickness loss of dermis presenting as a shallow open injury with a red, pink wound bed without slough.  Wound Description (Comments): oval shaped opening with smooth edges and pink/red center  Present on Admission: Yes     Data Review:    CBC Recent Labs  Lab 11/19/21 0452 11/19/21 1619 11/19/21 2245 11/20/21 0658 11/20/21 0928 11/20/21 1050 11/20/21 1308 11/20/21 1639 11/21/21 0045  WBC 21.1* 19.6* 15.6* 13.6*  --   --  15.7* 18.0* 15.8*  HGB 6.8* 6.8* 7.3* 5.2* 7.8* 8.2* 9.1* 9.6* 9.4*  HCT 20.5* 20.0* 22.2* 15.7* 23.0* 24.0* 26.1* 26.4* 26.5*  PLT 199 228 222 174  --   --  205 229 248  MCV 85.8 85.8 85.4 85.3  --   --  82.1 79.8* 81.3  MCH 28.5 29.2 28.1 28.3  --   --  28.6 29.0 28.8  MCHC 33.2 34.0 32.9 33.1  --   --  34.9 36.4* 35.5  RDW 20.3* 21.1* 19.9* 18.4*  --   --  17.2* 17.5* 18.3*  LYMPHSABS 1.0 0.9  --  0.8  --   --  0.4*  --  0.5*  MONOABS 2.0* 2.3*  --  1.8*  --   --  0.9  --  1.6*   EOSABS 0.1 0.1  --  0.1  --   --  0.1  --  0.0  BASOSABS 0.0 0.0  --  0.0  --   --  0.0  --  0.0    Electrolytes Recent Labs  Lab 11/16/21 1148 11/16/21 2233 11/17/21 0603 11/18/21 0151 11/19/21 0452 11/19/21 0950 11/20/21 0928 11/20/21 1050  NA 131*   < > 132* 130* 130*  --  131* 129*  K 5.2*   < > 4.6 5.1 4.4  --  4.9 5.1  CL 98   < > 96* 94* 95*  --  93* 93*  CO2 20*  --  24 23 25   --   --   --   GLUCOSE 96   < > 94 92 71  --  78 82  BUN 47*   < > 37* 49* 39*  --  66* 69*  CREATININE 3.92*   < > 3.08* 4.10* 4.12*  --  5.40* 5.10*  CALCIUM 6.8*  --  7.9* 7.6* 7.6*  --   --   --   ALBUMIN 1.7*  --  2.1* 1.9* 1.8*  --   --   --   INR 1.8*  --   --   --   --   --   --   --   TSH  --   --   --   --   --  3.168  --   --    < > = values in this interval not displayed.    ------------------------------------------------------------------------------------------------------------------ No results for input(s): CHOL, HDL, LDLCALC, TRIG, CHOLHDL, LDLDIRECT in the last 72 hours.  No results found for: HGBA1C  Recent Labs    11/19/21 0950  TSH 3.168   ------------------------------------------------------------------------------------------------------------------ ID Labs Recent Labs  Lab 11/17/21 0603 11/17/21 0604 11/18/21 0151 11/18/21 1621 11/19/21 0452 11/19/21 1619 11/19/21 2245 11/20/21 3825 11/20/21 0928 11/20/21 1050 11/20/21 1308 11/20/21 1639 11/21/21 0045  WBC  --    < >  --    < > 21.1*   < > 15.6* 13.6*  --   --  15.7* 18.0* 15.8*  PLT  --    < >  --    < > 199   < > 222 174  --   --  205 229 248  CREATININE 3.08*  --  4.10*  --  4.12*  --   --   --  5.40* 5.10*  --   --   --    < > = values in this interval not displayed.   Cardiac Enzymes No results for input(s): CKMB, TROPONINI, MYOGLOBIN in the last 168 hours.  Invalid input(s): CK  Radiology Reports DG Abd Portable 1V  Result Date: 11/21/2021 CLINICAL DATA:  NG tube placement EXAM:  PORTABLE ABDOMEN - 1 VIEW COMPARISON:  11/17/2021 FINDINGS: Tip of enteric tube is seen within the stomach. There is possible surgical drain in the right upper abdomen. There is possible cholecystostomy catheter in the right upper quadrant. Skin staples are seen. There are linear densities in the lower lung fields suggesting subsegmental atelectasis. Transverse diameter of heart is increased. IMPRESSION: Tip of enteric tube is seen in the stomach. Electronically Signed   By: Elmer Picker M.D.   On: 11/21/2021 13:44   CT ANGIO GI BLEED  Result Date: 11/20/2021 CLINICAL DATA:  Upper GI bleed common non variceal. Arterial source on endoscopy. EXAM: CTA ABDOMEN AND PELVIS WITHOUT AND WITH CONTRAST TECHNIQUE: Multidetector CT imaging of the abdomen and pelvis was performed using the standard protocol during bolus administration of intravenous contrast. Multiplanar reconstructed images and MIPs were obtained and reviewed to evaluate the vascular anatomy. RADIATION DOSE REDUCTION: This exam was performed according to the departmental dose-optimization program which includes automated exposure control, adjustment of the mA and/or kV according to patient size and/or use of iterative reconstruction technique. CONTRAST:  143mL OMNIPAQUE IOHEXOL 350 MG/ML SOLN COMPARISON:  11/15/2021, 06/21/2021. FINDINGS: VASCULAR Aorta: Aortic atherosclerosis. Normal caliber aorta without aneurysm, dissection, vasculitis or significant stenosis. Celiac: Patent without evidence of aneurysm, dissection, vasculitis or significant stenosis. Metallic coils are present in the region of the gastroduodenal artery. SMA: Patent without evidence of aneurysm, dissection, vasculitis or significant stenosis. Renals: Mild atherosclerotic calcification. Both renal arteries are patent without evidence of aneurysm, dissection, vasculitis, fibromuscular dysplasia or significant stenosis. IMA: Patent without evidence of aneurysm, dissection, vasculitis  or significant stenosis. Inflow: Atherosclerotic calcifications. Patent without evidence of aneurysm, dissection, vasculitis or significant stenosis. Proximal Outflow: Atherosclerotic calcifications. Bilateral common femoral and visualized portions of the superficial and profunda femoral arteries are patent without evidence of aneurysm, dissection, vasculitis or significant stenosis. Veins: No obvious venous abnormality within the limitations of this arterial phase study. Review of the MIP images confirms the above findings. NON-VASCULAR Lower chest: The heart is enlarged and there is a moderate pericardial effusion measuring up to 1.7 cm. Strandy atelectasis or infiltrate is present at the lung bases. There is a trace left pleural effusion. Hepatobiliary: No focal liver abnormality. A stone is present within the gallbladder. The gallbladder is contracted and gallbladder wall thickening is noted. No biliary ductal dilatation. Pancreas: There is mild dilatation of the pancreatic duct measuring 4 mm, not significantly changed from the prior exam. No local inflammatory changes. Spleen: The spleen is normal in size. There is a stable hypodensity in the spleen measuring 2 cm. Adrenals/Urinary Tract: The adrenal glands are within normal limits. Bilateral renal atrophy is noted. There is a complex hypodense mass in the right kidney measuring 2.9 cm. Stable subcentimeter hypodensities are noted in the kidneys bilaterally. No hydronephrosis. Excreted contrast is present in the urinary bladder. Stomach/Bowel: The stomach is partially distended. Evaluation for hemorrhage is limited in the presence of oral contrast. No bowel obstruction, free air, or pneumatosis. Right hemicolectomy changes are present. Lymphatic: Evaluation for lymphadenopathy is limited due to ascites. Reproductive: Prostate is unremarkable. Other: Moderate ascites is noted. Musculoskeletal: No acute osseous abnormality. IMPRESSION: VASCULAR 1. No evidence of  active GI hemorrhage. 2. Coil embolization of the gastroduodenal artery. 3. Aortic atherosclerosis with no significant stenosis or aneurysm. NON-VASCULAR 1. Stable moderate ascites. 2. Cardiomegaly with moderate pericardial effusion. 3. Cholelithiasis. 4. Strandy atelectasis or infiltrate at the lung bases. Small left pleural effusion. 5. Bilateral renal atrophy with multiple stable hypodensities. There is a complex lesion in the mid right kidney, stable from prior exams and previously characterized as a hemorrhagic or proteinaceous cyst on MRI. Electronically Signed   By: Brett Fairy M.D.   On: 11/20/2021 04:39

## 2021-11-22 NOTE — Addendum Note (Signed)
Addendum  created 11/22/21 0754 by Josephine Igo, CRNA   Order list changed, Pharmacy for encounter modified

## 2021-11-22 NOTE — Progress Notes (Signed)
Received report from [place]; pt arrived via wheelchair and was able to transfer to bed.  Pt reports pain as 10 out of 10 at this time.  Pt was orientated to unit and procedures, medicated, and left with bed in lowest position, call bell within reach, and tv on.  Patient has no further questions at this time.

## 2021-11-22 NOTE — Progress Notes (Signed)
°  [facility] Progress Note   Subjective:  Seen on HD, no new c/o's.   Objective Vitals:   11/22/21 1700 11/22/21 1706 11/22/21 1746 11/22/21 1747  BP: (!) 211/154 (!) 188/73    Pulse: 92 90  95  Resp: 15 16    Temp:   98.3 F (36.8 C) 98.3 F (36.8 C)  TempSrc:   Oral Oral  SpO2: 93% 97%    Weight:      Height:       Physical Exam General: Chronically ill appearing man, NAD. NG tube in Heart: RRR Lungs: CTA anteriorly, diffuse chronic upper airway noises Abdomen: soft, non-tender Extremities: No LE edema Dialysis Access: LUE AVF + thrill   [facility] HD: [facility]  3h 28min  450/1.5  65kg  2/2 bath  P4   L AVF  Hep none -Hectorol 2 mcg IV TIW -Mircera 150 mcg IV q 2 weeks (last 1/28  > due 2/11) -Venofer 100 mg IV weekly   Assessment/Plan: Recurrent GI bleed: sp EGD 2/1 that showed active bleeding from duodenal ulcer that could not be controlled, so pt sent to IR for embolization of GDA.  He improved then deteriorated again and went for mesenteric angiogram w/ embolization of anterior and posterior pancreaticoduodenal arcade. He had further rebleeding and went to OR per gen surg yesterday w/ ex-lap and surgical ulcer repair. ESRD: has HD x 4 last week including mwf and Sunday. Next HD today on schedule.  Anemia (ABLA + ESRD):  total 14U PRBCs + 2U FFP this admit. Surgical repair 2/05 yesterday. Next esa due 2/11, have ordered darbe 150 q Sat 1st dose 2/11.  MBD: Ca/Phos ok. Continue Renvela and VDRA. HTN/volume: No LE edema, 5kg up by wts. BP's normal to soft. UG goal 3 L today.  Nutrition: Albumin very low (1.8) - give protein supplements as tolerated.    Kelly Splinter, MD 11/22/2021, 7:18 PM       Recent Labs  Lab 11/18/21 0151 11/19/21 0452 11/20/21 0928 11/20/21 1050 11/22/21 0845  K 5.1 4.4   < > 5.1 4.9  BUN 49* 39*   < > 69* 53*  CREATININE 4.10* 4.12*   < > 5.10* 5.26*  ALBUMIN 1.9* 1.8*  --   --   --   CALCIUM 7.6* 7.6*  --   --  7.4*  PHOS  5.2* 5.0*  --   --  7.2*   < > = values in this interval not displayed.    Inpatient medications:  sodium chloride   Intravenous Once   Chlorhexidine Gluconate Cloth  6 each Topical Daily   doxercalciferol  2 mcg Intravenous Q T,Th,Sa-HD   pantoprazole (PROTONIX) IV  40 mg Intravenous Q12H   sucralfate  1 g Oral TID WC & HS    sodium chloride     hydrALAZINE, HYDROmorphone (DILAUDID) injection, ipratropium-albuterol, metoprolol tartrate

## 2021-11-22 NOTE — Progress Notes (Signed)
Patient refused lab work.

## 2021-11-22 NOTE — Progress Notes (Signed)
Subjective NG replaced yesterday because the patient wanted to eat ice. This AM c/o nose soreness and wants tube out. States he wants broth and ice and feels his body is ready for it. We discussed the anatomy of his duodenal ulcer and the importance of NPO status and monitoring of JP drain.   Objective: Vital signs in last 24 hours: Temp:  [97.4 F (36.3 C)-98.6 F (37 C)] 97.8 F (36.6 C) (02/07 0406) Pulse Rate:  [89-109] 98 (02/07 0406) Resp:  [8-29] 18 (02/07 0406) BP: (123-183)/(52-124) 147/79 (02/07 0406) SpO2:  [74 %-99 %] 93 % (02/07 0406) Weight:  [74 kg] 74 kg (02/07 0314) Last BM Date: 11/20/21  Intake/Output from previous day: 02/06 0701 - 02/07 0700 In: 175 [P.O.:175] Out: 130 [Emesis/NG output:100; Drains:30] Intake/Output this shift: No intake/output data recorded.  Gen: NAD, comfortable CV: RRR Pulm: Normal work of breathing Abd: Soft, appropriate incisional tenderness, not significantly distended. JP serosang Ext: SCDs in place  Lab Results: CBC  Recent Labs    11/20/21 1639 11/21/21 0045  WBC 18.0* 15.8*  HGB 9.6* 9.4*  HCT 26.4* 26.5*  PLT 229 248   BMET Recent Labs    11/20/21 0928 11/20/21 1050  NA 131* 129*  K 4.9 5.1  CL 93* 93*  GLUCOSE 78 82  BUN 66* 69*  CREATININE 5.40* 5.10*   PT/INR No results for input(s): LABPROT, INR in the last 72 hours. ABG No results for input(s): PHART, HCO3 in the last 72 hours.  Invalid input(s): PCO2, PO2  Studies/Results:  Anti-infectives: Anti-infectives (From admission, onward)    Start     Dose/Rate Route Frequency Ordered Stop   11/20/21 0930  ceFAZolin (ANCEF) IVPB 2g/100 mL premix       See Hyperspace for full Linked Orders Report.   2 g 200 mL/hr over 30 Minutes Intravenous On call to O.R. 11/20/21 0831 11/20/21 1005   11/20/21 0930  metroNIDAZOLE (FLAGYL) IVPB 500 mg       See Hyperspace for full Linked Orders Report.   500 mg 100 mL/hr over 60 Minutes Intravenous On call to O.R.  11/20/21 0831 11/20/21 1010        Assessment/Plan: Patient Active Problem List   Diagnosis Date Noted   Hemorrhagic shock ([place])    Rectal bleeding    Duodenal ulcer with hemorrhage    Upper GI bleed 11/05/2021   GI bleed 11/05/2021   ESRD (end stage renal disease) on dialysis ([place]) 11/01/2021   Hyperkalemia 11/01/2021   Volume overload 10/31/2021   SOB (shortness of breath) 10/31/2021   Acute hyponatremia 10/31/2021   Multifocal pneumonia 10/06/2021   Hypervolemia associated with renal insufficiency 10/05/2021   Pericardial effusion without cardiac tamponade 10/05/2021   Chronic combined systolic and diastolic CHF (congestive heart failure) ([place]) 10/05/2021   Elevated procalcitonin 10/05/2021   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/2021   Acute postoperative pain of abdomen    Renal mass    Major depressive disorder, single episode, severe ([place]) 06/18/2021   Bacterial peritonitis ([place])    Other ascites    Protein-calorie malnutrition, severe 06/13/2021   Ileus ([place]) 06/11/2021   Small bowel obstruction ([place]) 05/20/2021   DOE (dyspnea on exertion) 10/25/2020   Orthopnea 10/25/2020   Open wound of left hand 01/18/2018   Tibial fracture 01/14/2018   Tibial plateau fracture, left 01/13/2018   Malnutrition of moderate degree 09/20/2016   AKI (acute kidney injury) ([place])    History of colon cancer    Hyponatremia  Leukocytosis    Acute blood loss anemia    Acute metabolic encephalopathy 13/05/6577   ESRD on hemodialysis ([place]) 09/09/2016   Hypertensive emergency 09/09/2016   Acute respiratory failure with hypoxia ([place]) 09/09/2016   Acute pulmonary edema ([place]) 09/09/2016   Substance abuse ([place]) 09/09/2016   Nonadherence to medical treatment 09/09/2016   Anemia due to chronic kidney disease 09/09/2016   Altered mental status 09/09/2016   Acute respiratory failure ([place])    Drug ingestion    Hypertensive heart and chronic kidney disease with heart failure and with stage 5  chronic kidney disease, or end stage renal disease ([place]) 07/19/2016   Chest pain    Elevated troponin 46/96/2952   Acute diastolic heart failure, NYHA class 2 ([place]) 07/17/2016   Heroin abuse ([place]) 07/17/2016   HTN (hypertension) 07/17/2016   Benign prostate hyperplasia 07/16/2015   s/p Procedure(s): EXPLORATORY LAPAROTOMY, REPAIR OF BLEEDING DUODENAL ULCER 11/20/2021 - POD#2. Afebrile and vitals are improving - less tachycardic, labs this AM pending (pt states they stuck him many times and were unable to get blood) - would recommend continue NG To LIWS for at least 24 more hours. NGT replaced 2/6. Ok for ice while NGT in place. If patient removes NG again then only thing to be taken by mouth is carafate; no ice/water since his NG is no longer in place.  -JP to bulb suction, SS, 30 cc/24h  -BID PPI -Ambulate 5x/day - PT/OT -Ppx: SCDs; holding chemcical dvt ppx until we are certain there is no further bleeding, last transfusion 2/5. No clinical signs of bleeding. If his hgb is stable today then I think starting chemical DVT ppx Is reasonable.    LOS: 10 days  Obie Dredge, [facility], [place]

## 2021-11-23 DIAGNOSIS — N186 End stage renal disease: Secondary | ICD-10-CM

## 2021-11-23 DIAGNOSIS — Z992 Dependence on renal dialysis: Secondary | ICD-10-CM

## 2021-11-23 LAB — BPAM RBC
Blood Product Expiration Date: 202302222359
Blood Product Expiration Date: 202302232359
Blood Product Expiration Date: 202302232359
Blood Product Expiration Date: 202302232359
Blood Product Expiration Date: 202302232359
Blood Product Expiration Date: 202302232359
Blood Product Expiration Date: 202302232359
Blood Product Expiration Date: 202302262359
Blood Product Expiration Date: 202302272359
ISSUE DATE / TIME: 202302041744
ISSUE DATE / TIME: 202302050052
ISSUE DATE / TIME: 202302050802
ISSUE DATE / TIME: 202302050932
ISSUE DATE / TIME: 202302050932
ISSUE DATE / TIME: 202302051100
ISSUE DATE / TIME: 202302051100
Unit Type and Rh: 7300
Unit Type and Rh: 7300
Unit Type and Rh: 7300
Unit Type and Rh: 7300
Unit Type and Rh: 7300
Unit Type and Rh: 7300
Unit Type and Rh: 7300
Unit Type and Rh: 7300
Unit Type and Rh: 7300

## 2021-11-23 LAB — CBC WITH DIFFERENTIAL/PLATELET
Abs Immature Granulocytes: 0.11 10*3/uL — ABNORMAL HIGH (ref 0.00–0.07)
Basophils Absolute: 0 10*3/uL (ref 0.0–0.1)
Basophils Relative: 0 %
Eosinophils Absolute: 0.1 10*3/uL (ref 0.0–0.5)
Eosinophils Relative: 1 %
HCT: 23.3 % — ABNORMAL LOW (ref 39.0–52.0)
Hemoglobin: 7.6 g/dL — ABNORMAL LOW (ref 13.0–17.0)
Immature Granulocytes: 1 %
Lymphocytes Relative: 6 %
Lymphs Abs: 0.6 10*3/uL — ABNORMAL LOW (ref 0.7–4.0)
MCH: 27.9 pg (ref 26.0–34.0)
MCHC: 32.6 g/dL (ref 30.0–36.0)
MCV: 85.7 fL (ref 80.0–100.0)
Monocytes Absolute: 1.6 10*3/uL — ABNORMAL HIGH (ref 0.1–1.0)
Monocytes Relative: 15 %
Neutro Abs: 8.4 10*3/uL — ABNORMAL HIGH (ref 1.7–7.7)
Neutrophils Relative %: 77 %
Platelets: 278 10*3/uL (ref 150–400)
RBC: 2.72 MIL/uL — ABNORMAL LOW (ref 4.22–5.81)
RDW: 18.3 % — ABNORMAL HIGH (ref 11.5–15.5)
WBC: 10.9 10*3/uL — ABNORMAL HIGH (ref 4.0–10.5)
nRBC: 0 % (ref 0.0–0.2)

## 2021-11-23 LAB — TYPE AND SCREEN
ABO/RH(D): B POS
Antibody Screen: NEGATIVE
Unit division: 0
Unit division: 0
Unit division: 0
Unit division: 0
Unit division: 0
Unit division: 0
Unit division: 0
Unit division: 0
Unit division: 0

## 2021-11-23 MED ORDER — SUCRALFATE 1 GM/10ML PO SUSP
1.0000 g | Freq: Three times a day (TID) | ORAL | Status: DC
  Administered 2021-11-23 – 2021-11-25 (×9): 1 g
  Filled 2021-11-23 (×12): qty 10

## 2021-11-23 MED ORDER — HYDROMORPHONE HCL 1 MG/ML IJ SOLN
1.0000 mg | INTRAMUSCULAR | Status: DC | PRN
  Administered 2021-11-23 – 2021-11-25 (×11): 1 mg via INTRAVENOUS
  Filled 2021-11-23 (×12): qty 1

## 2021-11-23 NOTE — Progress Notes (Signed)
Pt c/o breakthrough pain between doses of dilaudid unable to administer PO medications d/t NPO. Requesting next dose at least 1 hour before able to administer. MD paged. See new orders.

## 2021-11-23 NOTE — Progress Notes (Signed)
Pt wife asking if she can bring pt hard candy as he requested her to do. Pt continues to have NG tube in place to low intermittent suction. MD paged to clarify if pt can have hard candy. Per verbal order pt can only have ice chips if NG tube is in place if NG tube is removed pt will not be able to have anything except for Carafate by mouth. Pt educated verbalized understanding and frustrations. Frustrations acknowledged. States he will keep NG tube in place and continue to eat ice chips.

## 2021-11-23 NOTE — Progress Notes (Signed)
Initial Nutrition Assessment  DOCUMENTATION CODES:   Severe malnutrition in context of chronic illness  INTERVENTION:   - Recommend initiation of TPN given inadequate nutrition x 10 days and severe malnutrition, discussed with Surgery who will reevaluate tomorrow after UGI series  NUTRITION DIAGNOSIS:   Severe Malnutrition related to chronic illness (CHF, polysubstance abuse, ESRD) as evidenced by severe fat depletion, severe muscle depletion.  GOAL:   Patient will meet greater than or equal to 90% of their needs  MONITOR:   Diet advancement, Labs, Weight trends, Skin, I & O's  REASON FOR ASSESSMENT:   NPO/Clear Liquid Diet    ASSESSMENT:   61 year old male who presented to the ED on 1/28 with hematemesis and hematochezia. PMH of recent successful GDA embolization for very large duodenal bulb ulcer with overlying clot on 11/05/21, ESRD on HD, anemia, HTN, CHF, pericardial effusion, colon cancer s/p R colectomy in 2014, polysubstance abuse, laparotomy for lysis of adhesions for bowel obstruction in August 2022.  01/30 - s/p EGD with findings of non-bleeding duodenal ulcer with an adherent clot 01/31 - transferred to ICU 02/01 - s/p EGD with findings of non-obstructing spurting duodenal ulcer with spurting hemorrhage which was injected with epinephrine and hemostatic spray used, s/p mesenteric angiogram and embolization of anterior and posterior pancreaticoduodenal arcade from SMA approach in IR 02/02 - diet advanced to renal with fluid restriction 02/05 - NPO, s/p ex-lap, lysis of adhesions, transduodenal suture ligation of duodenal ulcer 02/06 - pt removed NG tube, NG tube replaced 02/08 - NG tube removed  Pt has either been NPO or on clear liquids since 1/28 except for time period from 2/02-2/05 when pt was on a renal diet. When pt was on renal diet, 4 meal completions documented: 80%, 50%, 90%, and 75%.  Last HD was on 11/22/21 with 2500 ml net UF. Pt with NG tube to low  intermittent suction.  Spoke with pt at bedside who is understandably frustrated regarding inability to take POs. Pt reports feeling extremely weak and deconditioned secondary to inadequate nutrition. Pt reports that he must have lost a lot of weight since he has been here due to not eating for several days. Unable to obtain PTA diet history at this time due to pt's high level of frustration with current situation. Pt unsure of EDW and is unsure whether he has lost weight.  Pt with severe malnutrition, stage II wound to coccyx, and inadequate nutrition x 10 days. Recommend initiation of TPN. Dicussed with Surgery who will reevaluate after UGI series tomorrow AM.  EDW: 65 kg Admit weight: 74.4 kg Current weight: 71 kg  Medications reviewed and include: aranesp weekly, hectorol with HD, IV protonix, sucralfate  Labs reviewed: sodium 132, ionized calcium 0.95, phosphorus 7.2, hemoglobin 7.6, WBC 10.9  NGT: 800 ml x 24 hours R abd JP drain: 40 ml x 24 hours I/O's: -1.2 L since admit  NUTRITION - FOCUSED PHYSICAL EXAM:  Flowsheet Row Most Recent Value  Orbital Region Severe depletion  Upper Arm Region Severe depletion  Thoracic and Lumbar Region Moderate depletion  Buccal Region Severe depletion  Temple Region Moderate depletion  Clavicle Bone Region Moderate depletion  Clavicle and Acromion Bone Region Severe depletion  Scapular Bone Region Moderate depletion  Dorsal Hand Moderate depletion  Patellar Region Severe depletion  Anterior Thigh Region Severe depletion  Posterior Calf Region Severe depletion  Edema (RD Assessment) Mild  Hair Reviewed  Eyes Reviewed  Mouth Reviewed  Skin Reviewed  Nails Reviewed  Diet Order:   Diet Order             Diet NPO time specified  Diet effective now                   EDUCATION NEEDS:   Education needs have been addressed  Skin:  Skin Assessment: Skin Integrity Issues: Stage II: coccyx  Last BM:  11/20/21 large type  6  Height:   Ht Readings from Last 1 Encounters:  11/20/21 6\' 1"  (1.854 m)    Weight:   Wt Readings from Last 1 Encounters:  11/23/21 71 kg    BMI:  Body mass index is 20.65 kg/m.  Estimated Nutritional Needs:   Kcal:  2100-2300  Protein:  100-120 grams  Fluid:  >/= 2.0 L    Gustavus Bryant, MS, RD, LDN Inpatient Clinical Dietitian Please see AMiON for contact information.

## 2021-11-23 NOTE — Progress Notes (Signed)
OT Cancellation Note  Patient Details Name: Timothy [name] MRN: [id number] DOB: 1961/07/30   Cancelled Treatment:    Reason Eval/Treat Not Completed: Other (comment).  Patient requesting OT stop back closer to 11am.  OT to attempt at that time.    Timothy [name] 11/23/2021, 10:05 AM

## 2021-11-23 NOTE — Progress Notes (Signed)
Occupational Therapy Treatment Patient Details Name: Timothy [name] MRN: [id number] DOB: 09-10-1961 Today's Date: 11/23/2021   History of present illness 61 y/o male presented to ED on 11/12/21 after being found on floor with rectal bleeding. Multiple recent admissions for CHF volume overload, encephalopathy, pulmonary edema, and GI bleed. CTA showed large pericardial effusion and ascites. EGD on 1/30 demonstrated duodenal ulcer with overlying adherent clot but no signs of active bleeding. Pt underwent exploratory laparotomy and repair of bleeding duodenal ulcer on 2/5.  PMH: ESRD (HD TTS), colon cancer, CHF, polysubstance abuse   OT comments  Patient with fair progress, he's miserable having the nasal tube in, and from not being able to eat.  His defict is weakness, needing setup with supervision for ADL, and occasional Min Guard for in room mobility without an AD.  OT can continue efforts in the acute setting, but no post acute OT is anticipated.     Recommendations for follow up therapy are one component of a multi-disciplinary discharge planning process, led by the attending physician.  Recommendations may be updated based on patient status, additional functional criteria and insurance authorization.    Follow Up Recommendations  No OT follow up    Assistance Recommended at Discharge Set up Supervision/Assistance  Patient can return home with the following  Assistance with cooking/housework;Assist for transportation;Help with stairs or ramp for entrance   Equipment Recommendations       Recommendations for Other Services      Precautions / Restrictions Precautions Precautions: Fall;Other (comment) Precaution Comments: monitor O2, JP drain Restrictions Weight Bearing Restrictions: No       Mobility Bed Mobility Overal bed mobility: Modified Independent                  Transfers Overall transfer level: Needs assistance Equipment used: None Transfers: Sit to/from  Stand Sit to Stand: Supervision     Step pivot transfers: Min guard     General transfer comment: walked to BR reaching for objects in his environment     Balance Overall balance assessment: Needs assistance Sitting-balance support: No upper extremity supported, Feet supported Sitting balance-Leahy Scale: Good     Standing balance support: No upper extremity supported Standing balance-Leahy Scale: Fair                             ADL either performed or assessed with clinical judgement   ADL       Grooming: Set up;Sitting;Wash/dry face;Oral care               Lower Body Dressing: Supervision/safety;Sit to/from stand                      Extremity/Trunk Assessment Upper Extremity Assessment Upper Extremity Assessment: Generalized weakness   Lower Extremity Assessment Lower Extremity Assessment: Defer to PT evaluation   Cervical / Trunk Assessment Cervical / Trunk Assessment: Normal    Vision       Perception     Praxis      Cognition Arousal/Alertness: Awake/alert Behavior During Therapy: Flat affect Overall Cognitive Status: Within Functional Limits for tasks assessed  Pertinent Vitals/ Pain       Pain Assessment Pain Assessment: Faces Faces Pain Scale: Hurts a little bit Pain Location: nose Pain Descriptors / Indicators: Sore, Tender Pain Intervention(s): Monitored during session                                                          Frequency  Min 2X/week        Progress Toward Goals  OT Goals(current goals can now be found in the care plan section)  Progress towards OT goals: Progressing toward goals  Acute Rehab OT Goals Patient Stated Goal: to eat OT Goal Formulation: With patient Time For Goal Achievement: 12/05/21 Potential to Achieve Goals: Good  Plan Discharge plan remains appropriate     Co-evaluation                 AM-PAC OT "6 Clicks" Daily Activity     Outcome Measure   Help from another person eating meals?: Total Help from another person taking care of personal grooming?: None Help from another person toileting, which includes using toliet, bedpan, or urinal?: A Little Help from another person bathing (including washing, rinsing, drying)?: A Little Help from another person to put on and taking off regular upper body clothing?: A Little Help from another person to put on and taking off regular lower body clothing?: A Little 6 Click Score: 17    End of Session    OT Visit Diagnosis: Other abnormalities of gait and mobility (R26.89);Pain   Activity Tolerance Patient tolerated treatment well   Patient Left in chair;with call bell/phone within reach   Nurse Communication          Time: 1610-9604 OT Time Calculation (min): 21 min  Charges: OT General Charges $OT Visit: 1 Visit OT Treatments $Self Care/Home Management : 8-22 mins  11/23/2021  RP, OTR/L  Acute Rehabilitation Services  Office:  [phone]   Metta Clines 11/23/2021, 12:35 PM

## 2021-11-23 NOTE — Progress Notes (Signed)
°  [facility] Progress Note   Subjective:  Seen in room, no c/o's. Up in chair  Objective Vitals:   11/23/21 0316 11/23/21 0415 11/23/21 0726 11/23/21 1220  BP: (!) 161/87  (!) 155/68 (!) 151/79  Pulse: 90  89 90  Resp: 15  (!) 9 20  Temp: 98.7 F (37.1 C)  98.4 F (36.9 C) 98.2 F (36.8 C)  TempSrc: Axillary  Oral Oral  SpO2: 97%  93%   Weight:  71 kg    Height:       Physical Exam General: Chronically ill appearing thin man, NAD. NG tube in Heart: RRR Lungs: CTA anteriorly, diffuse chronic upper airway noises Abdomen: soft, non-tender Extremities: No LE edema Dialysis Access: LUE AVF + thrill   [facility] HD: [facility]  3h 33min  450/1.5  65kg  2/2 bath  P4   L AVF  Hep none -Hectorol 2 mcg IV TIW -Mircera 150 mcg IV q 2 weeks (last 1/28  > due 2/11) -Venofer 100 mg IV weekly   Assessment/Plan: Recurrent GI bleed: sp EGD 1/21 that showed active bleeding from duodenal ulcer that could not be controlled, so pt sent to IR and had embolization of the GDA.  He was dc'd on 1/25. He then rebled and was readmitted and had per IR on 2/01 a mesenteric angiogram w/ embolization of anterior and posterior pancreaticoduodenal arcade. He had further rebleeding here and went to OR 2/05 per gen surg  w/ ex-lap, LOA and surgical ulcer repair. Had NG tube in now. Per GI/ surgery/ pmd.  ESRD: cont HD TTS. HD tomorrow.  Anemia (ABLA + ESRD):  total 14U PRBCs + 2U FFP this admit. Surgical repair 2/05. Next esa due 2/11, have ordered darbe 150 q Sat 1st dose 2/11.  MBD: Ca/Phos ok. Continue Renvela and VDRA. HTN/volume: 5kg up by wts, tolerated 3 L off last HD w/o any sign of BP drop. BP's are high. Plan max UF w/ HD tomorrow.  Nutrition: Albumin very low (1.8) - give protein supplements as tolerated.    Kelly Splinter, MD 11/23/2021, 1:48 PM       Recent Labs  Lab 11/18/21 0151 11/19/21 0452 11/20/21 0928 11/20/21 1050 11/22/21 0845  K 5.1 4.4   < > 5.1 4.9  BUN 49* 39*   < >  69* 53*  CREATININE 4.10* 4.12*   < > 5.10* 5.26*  ALBUMIN 1.9* 1.8*  --   --   --   CALCIUM 7.6* 7.6*  --   --  7.4*  PHOS 5.2* 5.0*  --   --  7.2*   < > = values in this interval not displayed.    Inpatient medications:  sodium chloride   Intravenous Once   Chlorhexidine Gluconate Cloth  6 each Topical Daily   [START ON 11/26/2021] darbepoetin (ARANESP) injection - DIALYSIS  150 mcg Intravenous Q Sat-HD   doxercalciferol  2 mcg Intravenous Q T,Th,Sa-HD   pantoprazole (PROTONIX) IV  40 mg Intravenous Q12H   sucralfate  1 g Per Tube TID WC & HS    sodium chloride     hydrALAZINE, HYDROmorphone (DILAUDID) injection, ipratropium-albuterol, metoprolol tartrate

## 2021-11-23 NOTE — Progress Notes (Signed)
PROGRESS NOTE                                                                                                                                                                                                             Patient Demographics:    Timothy [name], is a 61 y.o. male, DOB - [birth year], ZSW:[id number]  Outpatient Primary MD for the patient is [name], MD    LOS - 11  Admit date - 11/12/2021    Chief Complaint  Patient presents with   Rectal Bleeding       Brief Narrative (HPI from H&P)   61 year old gentleman with history of ESRD TTS schedule, GERD with duodenal ulcer, hypertension, chronic pain on oxycodone who was brought in by EMS after he was found to have large rectal bleeding with acute blood loss related anemia, he was found to have a hemoglobin of 5.1 in the ER and admitted to ICU.  He required multiple units of packed RBC transfusion, he was seen by GI underwent EGD on 11/16/2021 showing a duodenal ulcer with spurting blood and adherent clot, IR was consulted and he subsequently underwent embolization on 11/16/2021.  H&H stabilized somewhat and he was transferred to hospitalist service on 11/19/2021.   EXPLORATORY LAPAROTOMY, REPAIR OF BLEEDING DUODENAL ULCER 11/20/2021.    Subjective:   Wants to eat some graham crackers    Assessment  & Plan :    Assessment and Plan:    Acute blood loss related anemia due to upper GI acute duodenal bleed  - he has received multiple units of packed RBCs in ICU, as well as getting 3 units of packed RBC between 11/19/2021 and 11/20/2018.  He is s/p EGD showing actively bleeding duodenal ulcer he is s/p embolization by IR on 11/16/2021.  Unfortunately night of 11/19/2021 he had a massive bloody bowel movement after which his H&H dropped again, requiring multiple units of packed RBC transfusion, general surgery saw the patient and took him to the OR for emergent laparotomy with  suturing of his duodenal ulcer on 11/20/2021.   -will monitor clinically, defer management of this issue to CCS and GI, transfuse as needed from our standpoint.    ESRD.  On TTS schedule.   -renal following  Paroxysmal A. fib RVR.   -new finding in the setting of severe anemia and acute  illness.  He was briefly on diltiazem drip.  He is since converted to sinus rhythm in ICU  -Mali vas 2 score will be greater than 2.   - extremely poor candidate for anticoagulation due to #1 above.  - follow-up with cardiology outpatient within 1 to 2 weeks of discharge  HTN.   -Blood pressure is stabilizing we will place him on IV Lopressor and hydralazine due to n.p.o. status.  Past history of colon cancer.   -Follow with PCP and primary GI post discharge.  Chronic pain and remote history of substance abuse.   -Monitor with supportive care.       Condition -  Guarded  Code Status :  Full  Consults  :  Renal, GI, IR, PCCM, CCS  PUD Prophylaxis : PPI   Procedures  :     IR - Angiogram 11/16/21 - 1. No evidence of active extravasation about the duodenum, however multifocal vessel irregularity was observed. 2. Technically successful Gel-Foam slurry embolization of the anterior and posterior pancreaticoduodenal arcades.      Disposition Plan  :    Status is: Inpatient - GI Bleed   DVT Prophylaxis  :    Place TED hose Start: 11/19/21 0900 SCDs Start: 11/12/21 2005    Inpatient Medications  Scheduled Meds:  sodium chloride   Intravenous Once   Chlorhexidine Gluconate Cloth  6 each Topical Daily   [START ON 11/26/2021] darbepoetin (ARANESP) injection - DIALYSIS  150 mcg Intravenous Q Sat-HD   doxercalciferol  2 mcg Intravenous Q T,Th,Sa-HD   pantoprazole (PROTONIX) IV  40 mg Intravenous Q12H   sucralfate  1 g Per Tube TID WC & HS   Continuous Infusions:  sodium chloride     PRN Meds:.hydrALAZINE, HYDROmorphone (DILAUDID) injection, ipratropium-albuterol, metoprolol  tartrate  Antibiotics  :    Anti-infectives (From admission, onward)    Start     Dose/Rate Route Frequency Ordered Stop   11/20/21 0930  ceFAZolin (ANCEF) IVPB 2g/100 mL premix       See Hyperspace for full Linked Orders Report.   2 g 200 mL/hr over 30 Minutes Intravenous On call to O.R. 11/20/21 0831 11/20/21 1005   11/20/21 0930  metroNIDAZOLE (FLAGYL) IVPB 500 mg       See Hyperspace for full Linked Orders Report.   500 mg 100 mL/hr over 60 Minutes Intravenous On call to O.R. 11/20/21 0831 11/20/21 1010        Time Spent in minutes  [place] DO on 11/23/2021 at 12:24 PM      Objective:   Vitals:   11/23/21 0316 11/23/21 0415 11/23/21 0726 11/23/21 1220  BP: (!) 161/87  (!) 155/68 (!) 151/79  Pulse: 90  89 90  Resp: 15  (!) 9 20  Temp: 98.7 F (37.1 C)  98.4 F (36.9 C) 98.2 F (36.8 C)  TempSrc: Axillary  Oral Oral  SpO2: 97%  93%   Weight:  71 kg    Height:        Wt Readings from Last 3 Encounters:  11/23/21 71 kg  11/08/21 71.5 kg  11/02/21 66.8 kg     Intake/Output Summary (Last 24 hours) at 11/23/2021 1224 Last data filed at 11/23/2021 0700 Gross per 24 hour  Intake 720 ml  Output 3340 ml  Net -2620 ml     Physical Exam   General: Appearance:   unpleasant male in no acute distress     Lungs:  respirations unlabored  Heart:    Normal heart rate.    MS:   All extremities are intact.    Neurologic:   Awake, alert       RN pressure injury documentation: Pressure Injury 11/12/21 Coccyx Right;Upper Stage 2 -  Partial thickness loss of dermis presenting as a shallow open injury with a red, pink wound bed without slough. oval shaped opening with smooth edges and pink/red center (Active)  11/12/21 2100  Location: Coccyx  Location Orientation: Right;Upper  Staging: Stage 2 -  Partial thickness loss of dermis presenting as a shallow open injury with a red, pink wound bed without slough.  Wound Description (Comments): oval shaped  opening with smooth edges and pink/red center  Present on Admission: Yes     Data Review:    CBC Recent Labs  Lab 11/19/21 1619 11/19/21 2245 11/20/21 0658 11/20/21 0928 11/20/21 1308 11/20/21 1639 11/21/21 0045 11/22/21 0845 11/23/21 0624  WBC 19.6*   < > 13.6*  --  15.7* 18.0* 15.8* 12.8* 10.9*  HGB 6.8*   < > 5.2*   < > 9.1* 9.6* 9.4* 7.9* 7.6*  HCT 20.0*   < > 15.7*   < > 26.1* 26.4* 26.5* 24.2* 23.3*  PLT 228   < > 174  --  205 229 248 284 278  MCV 85.8   < > 85.3  --  82.1 79.8* 81.3 84.3 85.7  MCH 29.2   < > 28.3  --  28.6 29.0 28.8 27.5 27.9  MCHC 34.0   < > 33.1  --  34.9 36.4* 35.5 32.6 32.6  RDW 21.1*   < > 18.4*  --  17.2* 17.5* 18.3* 18.6* 18.3*  LYMPHSABS 0.9  --  0.8  --  0.4*  --  0.5*  --  0.6*  MONOABS 2.3*  --  1.8*  --  0.9  --  1.6*  --  1.6*  EOSABS 0.1  --  0.1  --  0.1  --  0.0  --  0.1  BASOSABS 0.0  --  0.0  --  0.0  --  0.0  --  0.0   < > = values in this interval not displayed.    Electrolytes Recent Labs  Lab 11/17/21 0603 11/18/21 0151 11/19/21 0452 11/19/21 0950 11/20/21 0928 11/20/21 1050 11/22/21 0845  NA 132* 130* 130*  --  131* 129* 132*  K 4.6 5.1 4.4  --  4.9 5.1 4.9  CL 96* 94* 95*  --  93* 93* 95*  CO2 24 23 25   --   --   --  23  GLUCOSE 94 92 71  --  78 82 70  BUN 37* 49* 39*  --  66* 69* 53*  CREATININE 3.08* 4.10* 4.12*  --  5.40* 5.10* 5.26*  CALCIUM 7.9* 7.6* 7.6*  --   --   --  7.4*  ALBUMIN 2.1* 1.9* 1.8*  --   --   --   --   TSH  --   --   --  3.168  --   --   --      ------------------------------------------------------------------------------------------------------------------ ID Labs Recent Labs  Lab 11/18/21 0151 11/18/21 1621 11/19/21 0452 11/19/21 1619 11/20/21 0928 11/20/21 1050 11/20/21 1308 11/20/21 1639 11/21/21 0045 11/22/21 0845 11/23/21 0624  WBC  --    < > 21.1*   < >  --   --  15.7* 18.0* 15.8* 12.8* 10.9*  PLT  --    < >  199   < >  --   --  205 229 248 284 278  CREATININE 4.10*   --  4.12*  --  5.40* 5.10*  --   --   --  5.26*  --    < > = values in this interval not displayed.    Radiology Reports DG Abd Portable 1V  Result Date: 11/21/2021 CLINICAL DATA:  NG tube placement EXAM: PORTABLE ABDOMEN - 1 VIEW COMPARISON:  11/17/2021 FINDINGS: Tip of enteric tube is seen within the stomach. There is possible surgical drain in the right upper abdomen. There is possible cholecystostomy catheter in the right upper quadrant. Skin staples are seen. There are linear densities in the lower lung fields suggesting subsegmental atelectasis. Transverse diameter of heart is increased. IMPRESSION: Tip of enteric tube is seen in the stomach. Electronically Signed   By: Elmer Picker M.D.   On: 11/21/2021 13:44   CT ANGIO GI BLEED  Result Date: 11/20/2021 CLINICAL DATA:  Upper GI bleed common non variceal. Arterial source on endoscopy. EXAM: CTA ABDOMEN AND PELVIS WITHOUT AND WITH CONTRAST TECHNIQUE: Multidetector CT imaging of the abdomen and pelvis was performed using the standard protocol during bolus administration of intravenous contrast. Multiplanar reconstructed images and MIPs were obtained and reviewed to evaluate the vascular anatomy. RADIATION DOSE REDUCTION: This exam was performed according to the departmental dose-optimization program which includes automated exposure control, adjustment of the mA and/or kV according to patient size and/or use of iterative reconstruction technique. CONTRAST:  1105mL OMNIPAQUE IOHEXOL 350 MG/ML SOLN COMPARISON:  11/15/2021, 06/21/2021. FINDINGS: VASCULAR Aorta: Aortic atherosclerosis. Normal caliber aorta without aneurysm, dissection, vasculitis or significant stenosis. Celiac: Patent without evidence of aneurysm, dissection, vasculitis or significant stenosis. Metallic coils are present in the region of the gastroduodenal artery. SMA: Patent without evidence of aneurysm, dissection, vasculitis or significant stenosis. Renals: Mild atherosclerotic  calcification. Both renal arteries are patent without evidence of aneurysm, dissection, vasculitis, fibromuscular dysplasia or significant stenosis. IMA: Patent without evidence of aneurysm, dissection, vasculitis or significant stenosis. Inflow: Atherosclerotic calcifications. Patent without evidence of aneurysm, dissection, vasculitis or significant stenosis. Proximal Outflow: Atherosclerotic calcifications. Bilateral common femoral and visualized portions of the superficial and profunda femoral arteries are patent without evidence of aneurysm, dissection, vasculitis or significant stenosis. Veins: No obvious venous abnormality within the limitations of this arterial phase study. Review of the MIP images confirms the above findings. NON-VASCULAR Lower chest: The heart is enlarged and there is a moderate pericardial effusion measuring up to 1.7 cm. Strandy atelectasis or infiltrate is present at the lung bases. There is a trace left pleural effusion. Hepatobiliary: No focal liver abnormality. A stone is present within the gallbladder. The gallbladder is contracted and gallbladder wall thickening is noted. No biliary ductal dilatation. Pancreas: There is mild dilatation of the pancreatic duct measuring 4 mm, not significantly changed from the prior exam. No local inflammatory changes. Spleen: The spleen is normal in size. There is a stable hypodensity in the spleen measuring 2 cm. Adrenals/Urinary Tract: The adrenal glands are within normal limits. Bilateral renal atrophy is noted. There is a complex hypodense mass in the right kidney measuring 2.9 cm. Stable subcentimeter hypodensities are noted in the kidneys bilaterally. No hydronephrosis. Excreted contrast is present in the urinary bladder. Stomach/Bowel: The stomach is partially distended. Evaluation for hemorrhage is limited in the presence of oral contrast. No bowel obstruction, free air, or pneumatosis. Right hemicolectomy changes are present. Lymphatic:  Evaluation for lymphadenopathy is limited due to  ascites. Reproductive: Prostate is unremarkable. Other: Moderate ascites is noted. Musculoskeletal: No acute osseous abnormality. IMPRESSION: VASCULAR 1. No evidence of active GI hemorrhage. 2. Coil embolization of the gastroduodenal artery. 3. Aortic atherosclerosis with no significant stenosis or aneurysm. NON-VASCULAR 1. Stable moderate ascites. 2. Cardiomegaly with moderate pericardial effusion. 3. Cholelithiasis. 4. Strandy atelectasis or infiltrate at the lung bases. Small left pleural effusion. 5. Bilateral renal atrophy with multiple stable hypodensities. There is a complex lesion in the mid right kidney, stable from prior exams and previously characterized as a hemorrhagic or proteinaceous cyst on MRI. Electronically Signed   By: Brett Fairy M.D.   On: 11/20/2021 04:39

## 2021-11-23 NOTE — Progress Notes (Signed)
PT Cancellation Note  Patient Details Name: Timothy [name] MRN: [id number] DOB: [birth year]   Cancelled Treatment:    Reason Eval/Treat Not Completed: Patient declined, no reason specified. PT attempted to see pt for mobility twice on this date, pt refusing both times. Pt reports he is not moving until he gets to eat. PT provides education and encouragement to mobilize however pt remains steadfast in his refusal.   Zenaida Niece 11/23/2021, 2:59 PM

## 2021-11-23 NOTE — Progress Notes (Signed)
Subjective Continues to complain of pain and discomfort from NG tube and wants it out today. We again discussed the importance of allowing his repair to heal and remaining NPO. We also discussed the importance of negative upper GI tomorrow before clearing him for oral intake. He is adamant about getting his NGT out. His abdominal pain is stable and not bothersome. He has not been out of bed because of the NGT.  Objective: Vital signs in last 24 hours: Temp:  [97.1 F (36.2 C)-98.7 F (37.1 C)] 98.4 F (36.9 C) (02/08 0726) Pulse Rate:  [89-95] 89 (02/08 0726) Resp:  [9-22] 9 (02/08 0726) BP: (144-211)/(68-154) 155/68 (02/08 0726) SpO2:  [90 %-99 %] 93 % (02/08 0726) Weight:  [71 kg-74 kg] 71 kg (02/08 0415) Last BM Date: 11/20/21  Intake/Output from previous day: 02/07 0701 - 02/08 0700 In: 720 [P.O.:720] Out: 3340 [Emesis/NG output:800; Drains:40] Intake/Output this shift: No intake/output data recorded.  Gen: NAD, comfortable CV: RRR Pulm: Normal work of breathing Abd: Soft, appropriate incisional tenderness, not significantly distended. Honeycomb dressing c/d/I. NGT with thin bilious output. JP serosang Ext: SCDs in place. Calves soft and nontender bilaterally  Lab Results: CBC  Recent Labs    11/22/21 0845 11/23/21 0624  WBC 12.8* 10.9*  HGB 7.9* 7.6*  HCT 24.2* 23.3*  PLT 284 278    BMET Recent Labs    11/20/21 1050 11/22/21 0845  NA 129* 132*  K 5.1 4.9  CL 93* 95*  CO2  --  23  GLUCOSE 82 70  BUN 69* 53*  CREATININE 5.10* 5.26*  CALCIUM  --  7.4*    PT/INR No results for input(s): LABPROT, INR in the last 72 hours. ABG No results for input(s): PHART, HCO3 in the last 72 hours.  Invalid input(s): PCO2, PO2  Studies/Results:  Anti-infectives: Anti-infectives (From admission, onward)    Start     Dose/Rate Route Frequency Ordered Stop   11/20/21 0930  ceFAZolin (ANCEF) IVPB 2g/100 mL premix       See Hyperspace for full Linked Orders Report.    2 g 200 mL/hr over 30 Minutes Intravenous On call to O.R. 11/20/21 0831 11/20/21 1005   11/20/21 0930  metroNIDAZOLE (FLAGYL) IVPB 500 mg       See Hyperspace for full Linked Orders Report.   500 mg 100 mL/hr over 60 Minutes Intravenous On call to O.R. 11/20/21 0831 11/20/21 1010        Assessment/Plan: Patient Active Problem List   Diagnosis Date Noted   Hemorrhagic shock ([place])    Rectal bleeding    Duodenal ulcer with hemorrhage    Upper GI bleed 11/05/2021   GI bleed 11/05/2021   ESRD (end stage renal disease) on dialysis ([place]) 11/01/2021   Hyperkalemia 11/01/2021   Volume overload 10/31/2021   SOB (shortness of breath) 10/31/2021   Acute hyponatremia 10/31/2021   Multifocal pneumonia 10/06/2021   Hypervolemia associated with renal insufficiency 10/05/2021   Pericardial effusion without cardiac tamponade 10/05/2021   Chronic combined systolic and diastolic CHF (congestive heart failure) ([place]) 10/05/2021   Elevated procalcitonin 10/05/2021   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/2021   Acute postoperative pain of abdomen    Renal mass    Major depressive disorder, single episode, severe ([place]) 06/18/2021   Bacterial peritonitis ([place])    Other ascites    Protein-calorie malnutrition, severe 06/13/2021   Ileus ([place]) 06/11/2021   Small bowel obstruction ([place]) 05/20/2021   DOE (dyspnea on exertion) 10/25/2020  Orthopnea 10/25/2020   Open wound of left hand 01/18/2018   Tibial fracture 01/14/2018   Tibial plateau fracture, left 01/13/2018   Malnutrition of moderate degree 09/20/2016   AKI (acute kidney injury) ([place])    History of colon cancer    Hyponatremia    Leukocytosis    Acute blood loss anemia    Acute metabolic encephalopathy 43/32/9518   ESRD on hemodialysis ([place]) 09/09/2016   Hypertensive emergency 09/09/2016   Acute respiratory failure with hypoxia ([place]) 09/09/2016   Acute pulmonary edema ([place]) 09/09/2016   Substance abuse ([place]) 09/09/2016   Nonadherence  to medical treatment 09/09/2016   Anemia due to chronic kidney disease 09/09/2016   Altered mental status 09/09/2016   Acute respiratory failure ([place])    Drug ingestion    Hypertensive heart and chronic kidney disease with heart failure and with stage 5 chronic kidney disease, or end stage renal disease ([place]) 07/19/2016   Chest pain    Elevated troponin 84/16/6063   Acute diastolic heart failure, NYHA class 2 ([place]) 07/17/2016   Heroin abuse ([place]) 07/17/2016   HTN (hypertension) 07/17/2016   Benign prostate hyperplasia 07/16/2015   s/p Procedure(s): EXPLORATORY LAPAROTOMY, REPAIR OF BLEEDING DUODENAL ULCER 11/20/2021 by Dr. Zenia Resides - POD#3. Afebrile, VSS  - hgb 7.6 from 7.9 - hold DVT prophylaxis and monitor - NGT to LIWS - 800 ml out. Patient adamant to get NGT our despite recommendation it stay. Okay to remove - only thing to be taken by mouth is carafate; no ice/water when NG is no longer in place.  -JP to bulb suction, SS, 40 cc/24h  -BID PPI -Ambulate 5x/day - PT/OT -Ppx: SCDs; holding chemcical dvt ppx until we are certain there is no further bleeding, last transfusion 2/5. No clinical signs of bleeding but hemoglobin drifting down - continue to monitor  FEN: strict NPO ID: none currently VTE: SCDs   LOS: 11 days   Winferd Humphrey, [facility] Surgery 11/23/2021, 8:06 AM Please see Amion for pager number during day hours 7:00am-4:30pm

## 2021-11-24 ENCOUNTER — Inpatient Hospital Stay (HOSPITAL_COMMUNITY): Payer: Medicare (Managed Care)

## 2021-11-24 LAB — CBC
HCT: 24.3 % — ABNORMAL LOW (ref 39.0–52.0)
Hemoglobin: 8 g/dL — ABNORMAL LOW (ref 13.0–17.0)
MCH: 28.4 pg (ref 26.0–34.0)
MCHC: 32.9 g/dL (ref 30.0–36.0)
MCV: 86.2 fL (ref 80.0–100.0)
Platelets: 363 10*3/uL (ref 150–400)
RBC: 2.82 MIL/uL — ABNORMAL LOW (ref 4.22–5.81)
RDW: 18 % — ABNORMAL HIGH (ref 11.5–15.5)
WBC: 9.1 10*3/uL (ref 4.0–10.5)
nRBC: 0 % (ref 0.0–0.2)

## 2021-11-24 LAB — RENAL FUNCTION PANEL
Albumin: 1.6 g/dL — ABNORMAL LOW (ref 3.5–5.0)
Albumin: 1.5 g/dL — ABNORMAL LOW (ref 3.5–5.0)
Anion gap: 14 (ref 5–15)
Anion gap: 13 (ref 5–15)
BUN: 36 mg/dL — ABNORMAL HIGH (ref 6–20)
BUN: 37 mg/dL — ABNORMAL HIGH (ref 6–20)
CO2: 23 mmol/L (ref 22–32)
CO2: 22 mmol/L (ref 22–32)
Calcium: 7.5 mg/dL — ABNORMAL LOW (ref 8.9–10.3)
Calcium: 7.5 mg/dL — ABNORMAL LOW (ref 8.9–10.3)
Chloride: 91 mmol/L — ABNORMAL LOW (ref 98–111)
Chloride: 91 mmol/L — ABNORMAL LOW (ref 98–111)
Creatinine, Ser: 4.91 mg/dL — ABNORMAL HIGH (ref 0.61–1.24)
Creatinine, Ser: 5.04 mg/dL — ABNORMAL HIGH (ref 0.61–1.24)
GFR, Estimated: 13 mL/min — ABNORMAL LOW (ref >60)
GFR, Estimated: 12 mL/min — ABNORMAL LOW (ref >60)
Glucose, Bld: 51 mg/dL — ABNORMAL LOW (ref 70–99)
Glucose, Bld: 57 mg/dL — ABNORMAL LOW (ref 70–99)
Phosphorus: 6 mg/dL — ABNORMAL HIGH (ref 2.5–4.6)
Phosphorus: 6 mg/dL — ABNORMAL HIGH (ref 2.5–4.6)
Potassium: 4.1 mmol/L (ref 3.5–5.1)
Potassium: 3.7 mmol/L (ref 3.5–5.1)
Sodium: 128 mmol/L — ABNORMAL LOW (ref 135–145)
Sodium: 126 mmol/L — ABNORMAL LOW (ref 135–145)

## 2021-11-24 LAB — GLUCOSE, CAPILLARY: Glucose-Capillary: 65 mg/dL — ABNORMAL LOW (ref 70–99)

## 2021-11-24 LAB — PREALBUMIN: Prealbumin: 7.6 mg/dL — ABNORMAL LOW (ref 18–38)

## 2021-11-24 MED ORDER — DEXTROSE 50 % IV SOLN: 1.0000 | Freq: Once | INTRAVENOUS | Status: DC | PRN

## 2021-11-24 MED ORDER — BOOST / RESOURCE BREEZE PO LIQD CUSTOM
1.0000 | Freq: Three times a day (TID) | ORAL | Status: DC
  Administered 2021-11-24: 237 mL via ORAL
  Administered 2021-11-25: 1 via ORAL

## 2021-11-24 MED ORDER — HEPARIN SODIUM (PORCINE) 5000 UNIT/ML IJ SOLN
5000.0000 [IU] | Freq: Three times a day (TID) | INTRAMUSCULAR | Status: DC
  Administered 2021-11-24 – 2021-11-27 (×8): 5000 [IU] via SUBCUTANEOUS
  Filled 2021-11-24 (×8): qty 1

## 2021-11-24 MED ORDER — IOHEXOL 300 MG/ML  SOLN
100.0000 mL | Freq: Once | INTRAMUSCULAR | Status: AC | PRN
  Administered 2021-11-24: 100 mL

## 2021-11-24 NOTE — Progress Notes (Signed)
Consult for PIV placement. Upon arrival to patient's room. Patient refused assessment for PIV placement. Nurse notified vis secure chat. Instructed to re-consult IV team when patient is ready for assessment for PIV placement. Fran Lowes, RN VAST

## 2021-11-24 NOTE — Progress Notes (Addendum)
PROGRESS NOTE                                                                                                                                                                                                             Patient Demographics:    Timothy [name], is a 61 y.o. male, DOB - 1961-05-30, WGY:[id number]  Outpatient Primary MD for the patient is [name], MD    LOS - 12  Admit date - 11/12/2021    Chief Complaint  Patient presents with   Rectal Bleeding       Brief Narrative (HPI from H&P)   61 year old gentleman with history of ESRD TTS schedule, GERD with duodenal ulcer, hypertension, chronic pain on oxycodone who was brought in by EMS after he was found to have large rectal bleeding with acute blood loss related anemia, he was found to have a hemoglobin of 5.1 in the ER and admitted to ICU.  He required multiple units of packed RBC transfusion, he was seen by GI underwent EGD on 11/16/2021 showing a duodenal ulcer with spurting blood and adherent clot, IR was consulted and he subsequently underwent embolization on 11/16/2021.  H&H stabilized somewhat and he was transferred to hospitalist service on 11/19/2021.   EXPLORATORY LAPAROTOMY, REPAIR OF BLEEDING DUODENAL ULCER 11/20/2021.    Subjective:   In HD    Assessment  & Plan :    Assessment and Plan:   Acute blood loss related anemia due to upper GI acute duodenal bleed  - he has received multiple units of packed RBCs in ICU, as well as getting 3 units of packed RBC between 11/19/2021 and 11/20/2018.  He is s/p EGD showing actively bleeding duodenal ulcer he is s/p embolization by IR on 11/16/2021.  Unfortunately night of 11/19/2021 he had a massive bloody bowel movement after which his H&H dropped again, requiring multiple units of packed RBC transfusion, general surgery saw the patient and took him to the OR for emergent laparotomy with suturing of his duodenal ulcer  on 11/20/2021.   -will monitor clinically, defer management of this issue to CCS and GI, transfuse as needed  -upper GI 2/9  Hyponatremia -unclear -? If patient is drinking water/getting ice chips   ESRD.  On TTS schedule.   -renal following  Paroxysmal A. fib RVR.   -new finding in the  setting of severe anemia and acute illness.  He was briefly on diltiazem drip.  He is since converted to sinus rhythm in ICU  -Mali vas 2 score will be greater than 2.   - extremely poor candidate for anticoagulation due to #1 above.  - follow-up with cardiology outpatient within 1 to 2 weeks of discharge  HTN.   -Blood pressure is stabilizing we will place him on IV Lopressor and hydralazine due to n.p.o. status.  Past history of colon cancer.   -Follow with PCP and primary GI post discharge.  Chronic pain and remote history of substance abuse.   -Monitor with supportive care.       Condition -  Guarded  Code Status :  Full  Consults  :  Renal, GI, IR, PCCM, CCS  PUD Prophylaxis : PPI   Procedures  :     IR - Angiogram 11/16/21 - 1. No evidence of active extravasation about the duodenum, however multifocal vessel irregularity was observed. 2. Technically successful Gel-Foam slurry embolization of the anterior and posterior pancreaticoduodenal arcades.        Status is: Inpatient - GI Bleed   DVT Prophylaxis  :    Place TED hose Start: 11/19/21 0900 SCDs Start: 11/12/21 2005    Inpatient Medications  Scheduled Meds:  sodium chloride   Intravenous Once   Chlorhexidine Gluconate Cloth  6 each Topical Daily   [START ON 11/26/2021] darbepoetin (ARANESP) injection - DIALYSIS  150 mcg Intravenous Q Sat-HD   doxercalciferol  2 mcg Intravenous Q T,Th,Sa-HD   pantoprazole (PROTONIX) IV  40 mg Intravenous Q12H   sucralfate  1 g Per Tube TID WC & HS   Continuous Infusions:   PRN Meds:.hydrALAZINE, HYDROmorphone (DILAUDID) injection, ipratropium-albuterol, metoprolol  tartrate  Antibiotics  :    Anti-infectives (From admission, onward)    Start     Dose/Rate Route Frequency Ordered Stop   11/20/21 0930  ceFAZolin (ANCEF) IVPB 2g/100 mL premix       See Hyperspace for full Linked Orders Report.   2 g 200 mL/hr over 30 Minutes Intravenous On call to O.R. 11/20/21 0831 11/20/21 1005   11/20/21 0930  metroNIDAZOLE (FLAGYL) IVPB 500 mg       See Hyperspace for full Linked Orders Report.   500 mg 100 mL/hr over 60 Minutes Intravenous On call to O.R. 11/20/21 0831 11/20/21 1010        Time Spent in minutes  [place] DO on 11/24/2021 at 1:34 PM      Objective:   Vitals:   11/24/21 1030 11/24/21 1055 11/24/21 1103 11/24/21 1155  BP: 119/83 105/85 114/71 (!) 164/76  Pulse: 89 61 83 87  Resp:   12 (!) 22  Temp:   97.7 F (36.5 C) 97.7 F (36.5 C)  TempSrc:   Oral Axillary  SpO2:   98% 97%  Weight:   67.1 kg   Height:        Wt Readings from Last 3 Encounters:  11/24/21 67.1 kg  11/08/21 71.5 kg  11/02/21 66.8 kg     Intake/Output Summary (Last 24 hours) at 11/24/2021 1334 Last data filed at 11/24/2021 1103 Gross per 24 hour  Intake --  Output 3895 ml  Net -3895 ml     Physical Exam   In HD      RN pressure injury documentation: Pressure Injury 11/12/21 Coccyx Right;Upper Stage 2 -  Partial thickness loss of dermis presenting as a  shallow open injury with a red, pink wound bed without slough. oval shaped opening with smooth edges and pink/red center (Active)  11/12/21 2100  Location: Coccyx  Location Orientation: Right;Upper  Staging: Stage 2 -  Partial thickness loss of dermis presenting as a shallow open injury with a red, pink wound bed without slough.  Wound Description (Comments): oval shaped opening with smooth edges and pink/red center  Present on Admission: Yes     Data Review:    CBC Recent Labs  Lab 11/19/21 1619 11/19/21 2245 11/20/21 4166 11/20/21 0630 11/20/21 1308 11/20/21 1639  11/21/21 0045 11/22/21 0845 11/23/21 0624 11/24/21 0356  WBC 19.6*   < > 13.6*  --  15.7* 18.0* 15.8* 12.8* 10.9* 9.1  HGB 6.8*   < > 5.2*   < > 9.1* 9.6* 9.4* 7.9* 7.6* 8.0*  HCT 20.0*   < > 15.7*   < > 26.1* 26.4* 26.5* 24.2* 23.3* 24.3*  PLT 228   < > 174  --  205 229 248 284 278 363  MCV 85.8   < > 85.3  --  82.1 79.8* 81.3 84.3 85.7 86.2  MCH 29.2   < > 28.3  --  28.6 29.0 28.8 27.5 27.9 28.4  MCHC 34.0   < > 33.1  --  34.9 36.4* 35.5 32.6 32.6 32.9  RDW 21.1*   < > 18.4*  --  17.2* 17.5* 18.3* 18.6* 18.3* 18.0*  LYMPHSABS 0.9  --  0.8  --  0.4*  --  0.5*  --  0.6*  --   MONOABS 2.3*  --  1.8*  --  0.9  --  1.6*  --  1.6*  --   EOSABS 0.1  --  0.1  --  0.1  --  0.0  --  0.1  --   BASOSABS 0.0  --  0.0  --  0.0  --  0.0  --  0.0  --    < > = values in this interval not displayed.    Electrolytes Recent Labs  Lab 11/18/21 0151 11/19/21 0452 11/19/21 0950 11/20/21 0928 11/20/21 1050 11/22/21 0845 11/24/21 0356 11/24/21 0819  NA 130* 130*  --  131* 129* 132* 128* 126*  K 5.1 4.4  --  4.9 5.1 4.9 4.1 3.7  CL 94* 95*  --  93* 93* 95* 91* 91*  CO2 23 25  --   --   --  23 23 22   GLUCOSE 92 71  --  78 82 70 51* 57*  BUN 49* 39*  --  66* 69* 53* 36* 37*  CREATININE 4.10* 4.12*  --  5.40* 5.10* 5.26* 4.91* 5.04*  CALCIUM 7.6* 7.6*  --   --   --  7.4* 7.5* 7.5*  ALBUMIN 1.9* 1.8*  --   --   --   --  1.6* 1.5*  TSH  --   --  3.168  --   --   --   --   --      ------------------------------------------------------------------------------------------------------------------ ID Labs Recent Labs  Lab 11/20/21 0928 11/20/21 1050 11/20/21 1308 11/20/21 1639 11/21/21 0045 11/22/21 0845 11/23/21 0624 11/24/21 0356 11/24/21 0819  WBC  --   --    < > 18.0* 15.8* 12.8* 10.9* 9.1  --   PLT  --   --    < > 229 248 284 278 363  --   CREATININE 5.40* 5.10*  --   --   --  5.26*  --  4.91* 5.04*   < > = values in this interval not displayed.    Radiology Reports DG Abd Portable  1V  Result Date: 11/21/2021 CLINICAL DATA:  NG tube placement EXAM: PORTABLE ABDOMEN - 1 VIEW COMPARISON:  11/17/2021 FINDINGS: Tip of enteric tube is seen within the stomach. There is possible surgical drain in the right upper abdomen. There is possible cholecystostomy catheter in the right upper quadrant. Skin staples are seen. There are linear densities in the lower lung fields suggesting subsegmental atelectasis. Transverse diameter of heart is increased. IMPRESSION: Tip of enteric tube is seen in the stomach. Electronically Signed   By: Elmer Picker M.D.   On: 11/21/2021 13:44   DG UGI W SINGLE CM (SOL OR THIN BA)  Result Date: 11/24/2021 CLINICAL DATA:  61 year old male with recurrent UGI bleed s/p coil embolization with IR 11/05/21 and 11/16/21. S/p ex lap with suture ligation od duodenal ulcer on the posterior wall on 11/20/21. Request for water soluble UGI to rule out leak. EXAM: DG UGI W SINGLE CM TECHNIQUE: Scout radiograph was obtained. Single contrast examination was performed using water soluble contrast. Total of 100 mL of Omnipaque 300 was injected through NGT. This exam was performed by Aimee Han PA-C, and was supervised and interpreted by Dr. Sabino Dick. FLUOROSCOPY TIME:  Radiation Exposure Index (as provided by the fluoroscopic device): 8.40 mGy If the device does not provide the exposure index: Fluoroscopy Time: 48 seconds COMPARISON:  November 20, 2021. FINDINGS: Scout Radiograph: Midline surgical staples, embolization coil, surgical clip in RLQ, and NGT noted. Esophagus: Not evaluated Esophageal motility:  Not evaluated Gastroesophageal reflux:  Not evaluated Ingested 37mm barium tablet:  Not given Stomach: Normal appearance Gastric emptying: Normal. Duodenum: Visualized portion of duodenum is unremarkable. Other:  No contrast extravasation is seen to suggest leakage. IMPRESSION: No contrast extravasation is seen from the stomach or duodenum to suggest leakage. Electronically Signed    By: Marijo Conception M.D.   On: 11/24/2021 13:13

## 2021-11-24 NOTE — Progress Notes (Signed)
°   11/24/21 1103  Vitals  Temp 97.7 F (36.5 C)  Temp Source Oral  BP 114/71  BP Location Right Arm  BP Method Automatic  Patient Position (if appropriate) Lying  Pulse Rate 83  Pulse Rate Source Monitor  Resp 12  Oxygen Therapy  SpO2 98 %  O2 Device Room Air  Pain Assessment  Pain Scale 0-10  Pain Score 0  Dialysis Weight  Weight 67.1 kg  Type of Weight Post-Dialysis  Post-Hemodialysis Assessment  Rinseback Volume (mL) 250 mL  KECN 285 V  Dialyzer Clearance Lightly streaked  Duration of HD Treatment -hour(s) 3 hour(s)  Hemodialysis Intake (mL) 500 mL  UF Total -Machine (mL) 4000 mL  Net UF (mL) 3500 mL  Tolerated HD Treatment Yes  Post-Hemodialysis Comments tx complete, pt stable  AVG/AVF Arterial Site Held (minutes) 7 minutes  AVG/AVF Venous Site Held (minutes) 7 minutes  Fistula / Graft Left Upper arm Arteriovenous fistula  Placement Date/Time: 09/19/16 1228   Placed prior to admission: No  Orientation: Left  Access Location: Upper arm  Access Type: Arteriovenous fistula  Site Condition No complications  Fistula / Graft Assessment Present;Thrill;Bruit  Status Deaccessed   HD tx complete, pt stable.

## 2021-11-24 NOTE — Progress Notes (Signed)
PT Cancellation Note  Patient Details Name: Timothy [name] MRN: [id number] DOB: August 18, 1961   Cancelled Treatment:    Reason Eval/Treat Not Completed: Patient declined, no reason specified. Pt received when ambulating back to bed from bathroom, declines further ambulation at this time until he is able to eat some protein. PT will follow up as time allows.   Zenaida Niece 11/24/2021, 2:41 PM

## 2021-11-24 NOTE — Progress Notes (Signed)
°  [name] Progress Note   Subjective:  Seen in HD, thirsty, no other c/o's.   Objective Vitals:   11/24/21 1030 11/24/21 1055 11/24/21 1103 11/24/21 1155  BP: 119/83 105/85 114/71 (!) 164/76  Pulse: 89 61 83 87  Resp:   12 (!) 22  Temp:   97.7 F (36.5 C) 97.7 F (36.5 C)  TempSrc:   Oral Axillary  SpO2:   98% 97%  Weight:   67.1 kg   Height:       Physical Exam General: Chronically ill appearing thin man, NAD. NG tube in Heart: RRR Lungs: CTA anteriorly, diffuse chronic upper airway noises Abdomen: soft, non-tender Extremities: No LE edema Dialysis Access: LUE AVF + thrill   OP HD: [place] [facility]  3h 83min  450/1.5  65kg  2/2 bath  P4   L AVF  Hep none -Hectorol 2 mcg IV TIW -Mircera 150 mcg IV q 2 weeks (last 1/28  > due 2/11) -Venofer 100 mg IV weekly   Assessment/Plan: Recurrent GI bleed: sp EGD 1/21 that showed active bleeding from duodenal ulcer that could not be controlled, so pt sent to IR and had embolization of the GDA.  He was dc'd on 1/25. He then rebled and was readmitted and had per IR on 2/01 a mesenteric angiogram w/ embolization of anterior and posterior pancreaticoduodenal arcade. Had further rebleeding here and went to OR 2/05 per gen surg  w/ ex-lap, LOA and surgical ulcer repair. Has NG tube in now. Per GI/ surgery/ pmd.  ESRD: cont HD TTS. HD today in progress Anemia (ABLA + ESRD):  total 14U PRBCs + 2U FFP this admit. Surgical repair 2/05. Next esa due 2/11, have ordered darbe 150 q Sat 1st dose 2/11.  MBD: Ca/Phos ok. Continue Renvela and VDRA. HTN/volume: 5kg up by wts, tolerated 3 L off last HD w/o any sign of BP drop. BP's high, max UF as tol w/ HD today.  Nutrition: Albumin very low (1.8) - give protein supplements as tolerated.    Kelly Splinter, MD 11/24/2021, 1:48 PM       Recent Labs  Lab 11/24/21 0356 11/24/21 0819  K 4.1 3.7  BUN 36* 37*  CREATININE 4.91* 5.04*  ALBUMIN 1.6* 1.5*  CALCIUM 7.5* 7.5*  PHOS 6.0* 6.0*     Inpatient medications:  sodium chloride   Intravenous Once   Chlorhexidine Gluconate Cloth  6 each Topical Daily   [START ON 11/26/2021] darbepoetin (ARANESP) injection - DIALYSIS  150 mcg Intravenous Q Sat-HD   doxercalciferol  2 mcg Intravenous Q T,Th,Sa-HD   pantoprazole (PROTONIX) IV  40 mg Intravenous Q12H   sucralfate  1 g Per Tube TID WC & HS     hydrALAZINE, HYDROmorphone (DILAUDID) injection, ipratropium-albuterol, metoprolol tartrate

## 2021-11-24 NOTE — Progress Notes (Signed)
Nutrition Follow-up  DOCUMENTATION CODES:   Severe malnutrition in context of chronic illness  INTERVENTION:   - Nepro Shake po TID, each supplement provides 425 kcal and 19 grams protein  - Renal MVI daily  NUTRITION DIAGNOSIS:   Severe Malnutrition related to chronic illness (CHF, polysubstance abuse, ESRD) as evidenced by severe fat depletion, severe muscle depletion.  Ongoing, being addressed via diet advancement and oral nutrition supplements  GOAL:   Patient will meet greater than or equal to 90% of their needs  Progressing  MONITOR:   PO intake, Supplement acceptance, Diet advancement, Labs, Weight trends, Skin, I & O's  REASON FOR ASSESSMENT:   NPO/Clear Liquid Diet    ASSESSMENT:   61 year old male who presented to the ED on 1/28 with hematemesis and hematochezia. PMH of recent successful GDA embolization for very large duodenal bulb ulcer with overlying clot on 11/05/21, ESRD on HD, anemia, HTN, CHF, pericardial effusion, colon cancer s/p R colectomy in 2014, polysubstance abuse, laparotomy for lysis of adhesions for bowel obstruction in August 2022.  01/30 - s/p EGD with findings of non-bleeding duodenal ulcer with an adherent clot 01/31 - transferred to ICU 02/01 - s/p EGD with findings of non-obstructing spurting duodenal ulcer with spurting hemorrhage which was injected with epinephrine and hemostatic spray used, s/p mesenteric angiogram and embolization of anterior and posterior pancreaticoduodenal arcade from SMA approach in IR 02/02 - diet advanced to renal with fluid restriction 02/05 - NPO, s/p ex-lap, lysis of adhesions, transduodenal suture ligation of duodenal ulcer 02/06 - pt removed NG tube, NG tube replaced 02/08 - NG tube removed 02/09 - UGI without evidence of leak, clear liquids 02/10 - full liquids  Last HD on 2/09 with 3500 ml net UF. Post-HD weight of 67.1 kg.  Attempted to speak with pt at bedside. Pt sleeping soundly and did not awaken  to RD voice. Diet advanced to full liquids this morning. Will d/c Boost Breeze and order Nepro Shake to provide more kcal and protein. Will also order daily renal MVI. Spoke with RN who reports giving pt Nepro shake this AM which he enjoyed and tolerated well. Per Surgery note, pt tolerated clears well and is not having N/V or abdominal pain.  EDW: 65 kg Admit weight: 74.4 kg Current weight: 68 kg  Medications reviewed and include: aranesp weekly, hectorol with HD, Boost Breeze TID, protonix, carafate  Labs reviewed: sodium 128, potassium 3.3, phosphorus 6.0 on 2/9, prealbumin 7.6, hemoglobin 8.9  I/O's: -4.0 L since admit  Diet Order:   Diet Order             Diet full liquid Room service appropriate? Yes; Fluid consistency: Thin  Diet effective now                   EDUCATION NEEDS:   Education needs have been addressed  Skin:  Skin Assessment: Skin Integrity Issues: Stage II: coccyx  Last BM:  11/24/21 large type 7  Height:   Ht Readings from Last 1 Encounters:  11/20/21 6\' 1"  (1.854 m)    Weight:   Wt Readings from Last 1 Encounters:  11/25/21 68 kg    BMI:  Body mass index is 19.78 kg/m.  Estimated Nutritional Needs:   Kcal:  2100-2300  Protein:  100-120 grams  Fluid:  >/= 2.0 L    Gustavus Bryant, MS, RD, LDN Inpatient Clinical Dietitian Please see AMiON for contact information.

## 2021-11-24 NOTE — Progress Notes (Signed)
Subjective Had dialysis this am and just got back from upper GI study. Complains of NGT discomfort and wanting to eat. We discussed UGI findings and that he can have clear liquids. He repeatedly requests crackers and I explained the rationale for slow diet advancement He denies abdominal pain, nausea or emesis.  Objective: Vital signs in last 24 hours: Temp:  [97.6 F (36.4 C)-98.5 F (36.9 C)] 97.7 F (36.5 C) (02/09 1155) Pulse Rate:  [61-93] 87 (02/09 1155) Resp:  [12-22] 22 (02/09 1155) BP: (104-164)/(68-86) 164/76 (02/09 1155) SpO2:  [90 %-98 %] 97 % (02/09 1155) Weight:  [67.1 kg-68.3 kg] 67.1 kg (02/09 1103) Last BM Date: 11/20/21  Intake/Output from previous day: 02/08 0701 - 02/09 0700 In: -  Out: 395 [Emesis/NG output:375; Drains:20] Intake/Output this shift: Total I/O In: -  Out: 3500 [Other:3500]  Gen: NAD, comfortable CV: RRR Pulm: Normal work of breathing Abd: Soft, appropriate incisional tenderness, not significantly distended. Honeycomb dressing c/d/I. NGT with thin bilious output. JP serosang Ext: SCDs in place. Calves soft and nontender bilaterally  Lab Results: CBC  Recent Labs    11/23/21 0624 11/24/21 0356  WBC 10.9* 9.1  HGB 7.6* 8.0*  HCT 23.3* 24.3*  PLT 278 363    BMET Recent Labs    11/24/21 0356 11/24/21 0819  NA 128* 126*  K 4.1 3.7  CL 91* 91*  CO2 23 22  GLUCOSE 51* 57*  BUN 36* 37*  CREATININE 4.91* 5.04*  CALCIUM 7.5* 7.5*    PT/INR No results for input(s): LABPROT, INR in the last 72 hours. ABG No results for input(s): PHART, HCO3 in the last 72 hours.  Invalid input(s): PCO2, PO2  Studies/Results:  Anti-infectives: Anti-infectives (From admission, onward)    Start     Dose/Rate Route Frequency Ordered Stop   11/20/21 0930  ceFAZolin (ANCEF) IVPB 2g/100 mL premix       See Hyperspace for full Linked Orders Report.   2 g 200 mL/hr over 30 Minutes Intravenous On call to O.R. 11/20/21 0831 11/20/21 1005    11/20/21 0930  metroNIDAZOLE (FLAGYL) IVPB 500 mg       See Hyperspace for full Linked Orders Report.   500 mg 100 mL/hr over 60 Minutes Intravenous On call to O.R. 11/20/21 0831 11/20/21 1010        Assessment/Plan: Patient Active Problem List   Diagnosis Date Noted   Hemorrhagic shock ([place])    Rectal bleeding    Duodenal ulcer with hemorrhage    Upper GI bleed 11/05/2021   GI bleed 11/05/2021   ESRD (end stage renal disease) on dialysis ([place]) 11/01/2021   Hyperkalemia 11/01/2021   Volume overload 10/31/2021   SOB (shortness of breath) 10/31/2021   Acute hyponatremia 10/31/2021   Multifocal pneumonia 10/06/2021   Hypervolemia associated with renal insufficiency 10/05/2021   Pericardial effusion without cardiac tamponade 10/05/2021   Chronic combined systolic and diastolic CHF (congestive heart failure) ([place]) 10/05/2021   Elevated procalcitonin 10/05/2021   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/2021   Acute postoperative pain of abdomen    Renal mass    Major depressive disorder, single episode, severe ([place]) 06/18/2021   Bacterial peritonitis ([place])    Other ascites    Protein-calorie malnutrition, severe 06/13/2021   Ileus ([place]) 06/11/2021   Small bowel obstruction ([place]) 05/20/2021   DOE (dyspnea on exertion) 10/25/2020   Orthopnea 10/25/2020   Open wound of left hand 01/18/2018   Tibial fracture 01/14/2018   Tibial plateau fracture, left  01/13/2018   Malnutrition of moderate degree 09/20/2016   AKI (acute kidney injury) ([place])    History of colon cancer    Hyponatremia    Leukocytosis    Acute blood loss anemia    Acute metabolic encephalopathy 94/80/1655   ESRD on hemodialysis ([place]) 09/09/2016   Hypertensive emergency 09/09/2016   Acute respiratory failure with hypoxia ([place]) 09/09/2016   Acute pulmonary edema ([place]) 09/09/2016   Substance abuse ([place]) 09/09/2016   Nonadherence to medical treatment 09/09/2016   Anemia due to chronic kidney disease 09/09/2016   Altered  mental status 09/09/2016   Acute respiratory failure ([place])    Drug ingestion    Hypertensive heart and chronic kidney disease with heart failure and with stage 5 chronic kidney disease, or end stage renal disease ([place]) 07/19/2016   Chest pain    Elevated troponin 37/48/2707   Acute diastolic heart failure, NYHA class 2 ([place]) 07/17/2016   Heroin abuse ([place]) 07/17/2016   HTN (hypertension) 07/17/2016   Benign prostate hyperplasia 07/16/2015   s/p Procedure(s): EXPLORATORY LAPAROTOMY, REPAIR OF BLEEDING DUODENAL ULCER 11/20/2021 by Dr. Zenia Resides - POD#4. Afebrile, VSS  - hgb stable at 8.0 - can start DVT prophylaxis - UGI 2/9 without evidence of leak. Start CLD -JP to bulb suction, SS, continue -BID PPI -Ambulate 5x/day - PT/OT  FEN: CLD ID: [id number] periop VTE: SCDs, subq heparin start today   LOS: 12 days   Winferd Humphrey, [facility] 11/24/2021, 2:06 PM Please see Amion for pager number during day hours 7:00am-4:30pm

## 2021-11-25 LAB — BASIC METABOLIC PANEL
Anion gap: 10 (ref 5–15)
BUN: 20 mg/dL (ref 6–20)
CO2: 24 mmol/L (ref 22–32)
Calcium: 7.5 mg/dL — ABNORMAL LOW (ref 8.9–10.3)
Chloride: 94 mmol/L — ABNORMAL LOW (ref 98–111)
Creatinine, Ser: 3.64 mg/dL — ABNORMAL HIGH (ref 0.61–1.24)
GFR, Estimated: 18 mL/min — ABNORMAL LOW (ref >60)
Glucose, Bld: 107 mg/dL — ABNORMAL HIGH (ref 70–99)
Potassium: 3.3 mmol/L — ABNORMAL LOW (ref 3.5–5.1)
Sodium: 128 mmol/L — ABNORMAL LOW (ref 135–145)

## 2021-11-25 LAB — CBC
HCT: 26.9 % — ABNORMAL LOW (ref 39.0–52.0)
Hemoglobin: 8.9 g/dL — ABNORMAL LOW (ref 13.0–17.0)
MCH: 28.4 pg (ref 26.0–34.0)
MCHC: 33.1 g/dL (ref 30.0–36.0)
MCV: 85.9 fL (ref 80.0–100.0)
Platelets: 425 10*3/uL — ABNORMAL HIGH (ref 150–400)
RBC: 3.13 MIL/uL — ABNORMAL LOW (ref 4.22–5.81)
RDW: 17.5 % — ABNORMAL HIGH (ref 11.5–15.5)
WBC: 7.7 10*3/uL (ref 4.0–10.5)
nRBC: 0 % (ref 0.0–0.2)

## 2021-11-25 MED ORDER — OXYCODONE HCL 5 MG PO TABS
5.0000 mg | ORAL_TABLET | ORAL | Status: DC | PRN
  Administered 2021-11-25 – 2021-11-27 (×10): 10 mg via ORAL
  Filled 2021-11-25 (×10): qty 2

## 2021-11-25 MED ORDER — SUCRALFATE 1 GM/10ML PO SUSP
1.0000 g | Freq: Three times a day (TID) | ORAL | Status: DC
  Administered 2021-11-25 – 2021-12-02 (×28): 1 g via ORAL
  Filled 2021-11-25 (×30): qty 10

## 2021-11-25 MED ORDER — PANTOPRAZOLE SODIUM 40 MG PO TBEC
40.0000 mg | DELAYED_RELEASE_TABLET | Freq: Two times a day (BID) | ORAL | Status: DC
  Administered 2021-11-25 – 2021-12-02 (×15): 40 mg via ORAL
  Filled 2021-11-25 (×15): qty 1

## 2021-11-25 MED ORDER — NEPRO/CARBSTEADY PO LIQD
237.0000 mL | Freq: Three times a day (TID) | ORAL | Status: DC
  Administered 2021-11-25 – 2021-12-02 (×17): 237 mL via ORAL

## 2021-11-25 MED ORDER — ACETAMINOPHEN 325 MG PO TABS
650.0000 mg | ORAL_TABLET | Freq: Four times a day (QID) | ORAL | Status: DC | PRN
  Administered 2021-11-25 – 2021-12-02 (×11): 650 mg via ORAL
  Filled 2021-11-25 (×13): qty 2

## 2021-11-25 MED ORDER — RENA-VITE PO TABS
1.0000 | ORAL_TABLET | Freq: Every day | ORAL | Status: DC
  Administered 2021-11-25 – 2021-12-01 (×7): 1 via ORAL
  Filled 2021-11-25 (×7): qty 1

## 2021-11-25 NOTE — TOC Progression Note (Signed)
Transition of Care [facility]) - Progression Note    Patient Details  Name: Timothy [name] MRN: [id number] Date of Birth: November 15, 1960  Transition of Care [facility]) CM/SW [place], RN Phone Number:[phone]  11/25/2021, 9:30 AM  Clinical Narrative:     Transition of Care [facility]) Screening Note   Patient Details  Name: Timothy [name] Date of Birth: [birth year]   Transition of Care [facility]) CM/SW Contact:    Angelita Ingles, RN Phone Number: 11/25/2021, 9:30 AM    Transition of Care Department [facility]) has reviewed patient and no TOC needs have been identified at this time. We will continue to monitor patient advancement through interdisciplinary progression rounds. If new patient transition needs arise, please place a TOC consult.     Expected Discharge Plan: Skilled Nursing Facility Barriers to Discharge: Continued Medical Work up  Expected Discharge Plan and Services Expected Discharge Plan: [place]       Living arrangements for the past 2 months: Apartment                                       Social Determinants of Health (SDOH) Interventions    Readmission Risk Interventions Readmission Risk Prevention Plan 11/16/2021 11/09/2021  Transportation Screening - Complete  Medication Review (RN Care Manager) Referral to Pharmacy Referral to Pharmacy  PCP or Specialist appointment within 3-5 days of discharge Not Complete Complete  PCP/Specialist Appt Not Complete comments patient not medically ready to d/c -  [place] or Home Care Consult Not Complete Patient refused  [place] or Home Care Consult Pt Refusal Comments not complete, waiting on PT recs -  SW Recovery Care/Counseling Consult Complete Complete  Palliative Care Screening Not Applicable Not [place] Not Complete Not Applicable  SNF Comments Pending PT recommendations (CSW following) -  Some recent data might be hidden

## 2021-11-25 NOTE — Progress Notes (Addendum)
Subjective Tolerated clears well yesterday - no nausea, emesis, abdominal pain.  Had Bmx2 yesterday. Feeling weak and does not want to ambulate until he has gotten more protein in his diet. Denies abdominal pain  Objective: Vital signs in last 24 hours: Temp:  [97.7 F (36.5 C)-98.2 F (36.8 C)] 97.7 F (36.5 C) (02/10 0716) Pulse Rate:  [61-94] 83 (02/10 0716) Resp:  [11-22] 11 (02/10 0716) BP: (105-177)/(63-95) 124/80 (02/10 0716) SpO2:  [92 %-98 %] 96 % (02/10 0716) Weight:  [67.1 kg-68 kg] 68 kg (02/10 0418) Last BM Date: 11/24/21  Intake/Output from previous day: 02/09 0701 - 02/10 0700 In: 1060 [P.O.:1060] Out: 3510 [Drains:10] Intake/Output this shift: No intake/output data recorded.  Gen: NAD, comfortable CV: RRR Pulm: Normal work of breathing Abd: Soft, no TTP, no distension. Staples in midline incision c/d/I - no surrounding erythema or induration. JP serosang Ext: SCDs in place. Calves soft and nontender bilaterally  Lab Results: CBC  Recent Labs    11/24/21 0356 11/25/21 0249  WBC 9.1 7.7  HGB 8.0* 8.9*  HCT 24.3* 26.9*  PLT 363 425*    BMET Recent Labs    11/24/21 0819 11/25/21 0249  NA 126* 128*  K 3.7 3.3*  CL 91* 94*  CO2 22 24  GLUCOSE 57* 107*  BUN 37* 20  CREATININE 5.04* 3.64*  CALCIUM 7.5* 7.5*    PT/INR No results for input(s): LABPROT, INR in the last 72 hours. ABG No results for input(s): PHART, HCO3 in the last 72 hours.  Invalid input(s): PCO2, PO2  Studies/Results:  Anti-infectives: Anti-infectives (From admission, onward)    Start     Dose/Rate Route Frequency Ordered Stop   11/20/21 0930  ceFAZolin (ANCEF) IVPB 2g/100 mL premix       See Hyperspace for full Linked Orders Report.   2 g 200 mL/hr over 30 Minutes Intravenous On call to O.R. 11/20/21 0831 11/20/21 1005   11/20/21 0930  metroNIDAZOLE (FLAGYL) IVPB 500 mg       See Hyperspace for full Linked Orders Report.   500 mg 100 mL/hr over 60 Minutes  Intravenous On call to O.R. 11/20/21 0831 11/20/21 1010        Assessment/Plan: Patient Active Problem List   Diagnosis Date Noted   Hemorrhagic shock ([place])    Rectal bleeding    Duodenal ulcer with hemorrhage    Upper GI bleed 11/05/2021   GI bleed 11/05/2021   ESRD (end stage renal disease) on dialysis ([place]) 11/01/2021   Hyperkalemia 11/01/2021   Volume overload 10/31/2021   SOB (shortness of breath) 10/31/2021   Acute hyponatremia 10/31/2021   Multifocal pneumonia 10/06/2021   Hypervolemia associated with renal insufficiency 10/05/2021   Pericardial effusion without cardiac tamponade 10/05/2021   Chronic combined systolic and diastolic CHF (congestive heart failure) ([place]) 10/05/2021   Elevated procalcitonin 10/05/2021   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/2021   Acute postoperative pain of abdomen    Renal mass    Major depressive disorder, single episode, severe ([place]) 06/18/2021   Bacterial peritonitis ([place])    Other ascites    Protein-calorie malnutrition, severe 06/13/2021   Ileus ([place]) 06/11/2021   Small bowel obstruction ([place]) 05/20/2021   DOE (dyspnea on exertion) 10/25/2020   Orthopnea 10/25/2020   Open wound of left hand 01/18/2018   Tibial fracture 01/14/2018   Tibial plateau fracture, left 01/13/2018   Malnutrition of moderate degree 09/20/2016   AKI (acute kidney injury) ([place])    History of colon cancer  Hyponatremia    Leukocytosis    Acute blood loss anemia    Acute metabolic encephalopathy 70/10/7492   ESRD on hemodialysis ([place]) 09/09/2016   Hypertensive emergency 09/09/2016   Acute respiratory failure with hypoxia ([place]) 09/09/2016   Acute pulmonary edema ([place]) 09/09/2016   Substance abuse ([place]) 09/09/2016   Nonadherence to medical treatment 09/09/2016   Anemia due to chronic kidney disease 09/09/2016   Altered mental status 09/09/2016   Acute respiratory failure ([place])    Drug ingestion    Hypertensive heart and chronic kidney disease with heart  failure and with stage 5 chronic kidney disease, or end stage renal disease ([place]) 07/19/2016   Chest pain    Elevated troponin 49/67/5916   Acute diastolic heart failure, NYHA class 2 ([place]) 07/17/2016   Heroin abuse ([place]) 07/17/2016   HTN (hypertension) 07/17/2016   Benign prostate hyperplasia 07/16/2015   s/p Procedure(s): EXPLORATORY LAPAROTOMY, REPAIR OF BLEEDING DUODENAL ULCER 11/20/2021 by Dr. Zenia Resides - POD#5. Afebrile, VSS  - hgb stable at 8.9 after starting dvt ppx - UGI 2/9 without evidence of leak. Tolerating CLD, advance to FLD - transition to PO pain meds -JP to bulb suction, SS, continue -BID PPI -Ambulate 5x/day - PT/OT  FEN: FLD, nepro shakes ID: ancef/flagyl periop VTE: SCDs, subq hep   LOS: 13 days   Winferd Humphrey, [facility] 11/25/2021, 9:01 AM Please see Amion for pager number during day hours 7:00am-4:30pm

## 2021-11-25 NOTE — Progress Notes (Signed)
°  [facility] Progress Note   Subjective:  no new c/o's  Objective Vitals:   11/25/21 0418 11/25/21 0716 11/25/21 1123 11/25/21 1532  BP:  124/80 (!) 168/85 107/73  Pulse:  83 91 91  Resp:  11 14 12   Temp:  97.7 F (36.5 C) 98.4 F (36.9 C) 97.9 F (36.6 C)  TempSrc:  Oral Oral Axillary  SpO2:  96% 92% 94%  Weight: 68 kg     Height:       Physical Exam General: Chronically ill appearing thin man, NAD. NG tube in Heart: RRR Lungs: CTA anteriorly, diffuse chronic upper airway noises Abdomen: soft, non-tender Extremities: No LE edema Dialysis Access: LUE AVF + thrill   OP HD: [facility]  3h 8min  450/1.5  65kg  2/2 bath  P4   L AVF  Hep none -Hectorol 2 mcg IV TIW -Mircera 150 mcg IV q 2 weeks (last 1/28  > due 2/11) -Venofer 100 mg IV weekly   Assessment/Plan: Recurrent GI bleed: sp EGD 1/21 that showed active bleeding from duodenal ulcer that could not be controlled, so pt sent to IR and had embolization of the GDA.  He was dc'd on 1/25. 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 Had further rebleeding here and went to OR 2/05 gen surg  w/ ex-lap, LOA and surgical ulcer repair. Has NG tube in now. Per GI/ surgery/ pmd.  ESRD: cont HD TTS. HD tomorrow.  Anemia (ABLA + ESRD):  total 14U PRBCs + 2U FFP this admit. Surgical repair 2/05. Next esa due 2/11, have ordered darbe 150 q Sat 1st dose 2/11.  MBD: Ca/Phos ok. Continue Renvela and VDRA. HTN/volume: 2kg up, no vol excess on exam. BP's lower today. Lower UF goal to 2 L tomorrow Nutrition: Albumin very low (1.8) - give protein supplements as tolerated.    Kelly Splinter, MD 11/25/2021, 5:30 PM       Recent Labs  Lab 11/24/21 0356 11/24/21 0819 11/25/21 0249  K 4.1 3.7 3.3*  BUN 36* 37* 20  CREATININE 4.91* 5.04* 3.64*  ALBUMIN 1.6* 1.5*  --   CALCIUM 7.5* 7.5* 7.5*  PHOS 6.0* 6.0*   --     Inpatient medications:  sodium chloride   Intravenous Once   Chlorhexidine Gluconate Cloth  6 each Topical Daily   [START ON 11/26/2021] darbepoetin (ARANESP) injection - DIALYSIS  150 mcg Intravenous Q Sat-HD   doxercalciferol  2 mcg Intravenous Q T,Th,Sa-HD   feeding supplement (NEPRO CARB STEADY)  237 mL Oral TID BM   heparin injection (subcutaneous)  5,000 Units Subcutaneous Q8H   multivitamin  1 tablet Oral QHS   pantoprazole  40 mg Oral BID   sucralfate  1 g Per Tube TID WC & HS     acetaminophen, dextrose, hydrALAZINE, HYDROmorphone (DILAUDID) injection, ipratropium-albuterol, metoprolol tartrate, oxyCODONE

## 2021-11-25 NOTE — Care Management Important Message (Signed)
Important Message  Patient Details  Name: Timothy [name] MRN: [id number] Date of Birth: [birth year]   Medicare Important Message Given:  Yes     Hannah Beat 11/25/2021, 11:33 AM

## 2021-11-25 NOTE — Progress Notes (Signed)
PROGRESS NOTE                                                                                                                                                                                                             Patient Demographics:    Timothy [name], is a 61 y.o. male, DOB - 09/22/1961, SAY:[id number]  Outpatient Primary MD for the patient is [name], MD    LOS - 62  Admit date - 11/12/2021    Chief Complaint  Patient presents with   Rectal Bleeding       Brief Narrative (HPI from H&P)   60 year old gentleman with history of ESRD TTS schedule, GERD with duodenal ulcer, hypertension, chronic pain on oxycodone who was brought in by EMS after he was found to have large rectal bleeding with acute blood loss related anemia, he was found to have a hemoglobin of 5.1 in the ER and admitted to ICU.  He required multiple units of packed RBC transfusion, he was seen by GI underwent EGD on 11/16/2021 showing a duodenal ulcer with spurting blood and adherent clot, IR was consulted and he subsequently underwent embolization on 11/16/2021.  H&H stabilized somewhat and he was transferred to hospitalist service on 11/19/2021.   EXPLORATORY LAPAROTOMY, REPAIR OF BLEEDING DUODENAL ULCER 11/20/2021.    Subjective:   sleeping    Assessment  & Plan :    Assessment and Plan:   Acute blood loss related anemia due to upper GI acute duodenal bleed  - he has received multiple units of packed RBCs in ICU, as well as getting 3 units of packed RBC between 11/19/2021 and 11/20/2018.  He is s/p EGD showing actively bleeding duodenal ulcer he is s/p embolization by IR on 11/16/2021.  Unfortunately night of 11/19/2021 he had a massive bloody bowel movement after which his H&H dropped again, requiring multiple units of packed RBC transfusion, general surgery saw the patient and took him to the OR for emergent laparotomy with suturing of his duodenal  ulcer on 11/20/2021.   -will monitor clinically, defer management of this issue to CCS and GI, transfuse as needed  -upper GI 2/9- no leaks -diet advanced to full liquids  Hyponatremia -unclear -improved now that he is not drinking ONLY water   ESRD.  On TTS schedule.   -renal following  Paroxysmal A. fib  RVR.   -new finding in the setting of severe anemia and acute illness.  He was briefly on diltiazem drip.  He is since converted to sinus rhythm in ICU  -Mali vas 2 score will be greater than 2.   - extremely poor candidate for anticoagulation due to #1 above.  - follow-up with cardiology outpatient within 1 to 2 weeks of discharge  HTN.   -Blood pressure is stabilizing we will place him on IV Lopressor and hydralazine due to n.p.o. status.  Past history of colon cancer.   -Follow with PCP and primary GI post discharge.  Chronic pain and remote history of substance abuse.   -Monitor with supportive care.       Condition -  Guarded  Code Status :  Full  Consults  :  Renal, GI, IR, PCCM, CCS  PUD Prophylaxis : PPI   Procedures  :     IR - Angiogram 11/16/21 - 1. No evidence of active extravasation about the duodenum, however multifocal vessel irregularity was observed. 2. Technically successful Gel-Foam slurry embolization of the anterior and posterior pancreaticoduodenal arcades.        Status is: Inpatient - GI Bleed   DVT Prophylaxis  :    heparin injection 5,000 Units Start: 11/24/21 1500 Place TED hose Start: 11/19/21 0900 SCDs Start: 11/12/21 2005    Inpatient Medications  Scheduled Meds:  sodium chloride   Intravenous Once   Chlorhexidine Gluconate Cloth  6 each Topical Daily   [START ON 11/26/2021] darbepoetin (ARANESP) injection - DIALYSIS  150 mcg Intravenous Q Sat-HD   doxercalciferol  2 mcg Intravenous Q T,Th,Sa-HD   feeding supplement (NEPRO CARB STEADY)  237 mL Oral TID BM   heparin injection (subcutaneous)  5,000 Units Subcutaneous Q8H    multivitamin  1 tablet Oral QHS   pantoprazole  40 mg Oral BID   sucralfate  1 g Per Tube TID WC & HS   Continuous Infusions:   PRN Meds:.acetaminophen, dextrose, hydrALAZINE, HYDROmorphone (DILAUDID) injection, ipratropium-albuterol, metoprolol tartrate, oxyCODONE  Antibiotics  :    Anti-infectives (From admission, onward)    Start     Dose/Rate Route Frequency Ordered Stop   11/20/21 0930  ceFAZolin (ANCEF) IVPB 2g/100 mL premix       See Hyperspace for full Linked Orders Report.   2 g 200 mL/hr over 30 Minutes Intravenous On call to O.R. 11/20/21 0831 11/20/21 1005   11/20/21 0930  metroNIDAZOLE (FLAGYL) IVPB 500 mg       See Hyperspace for full Linked Orders Report.   500 mg 100 mL/hr over 60 Minutes Intravenous On call to O.R. 11/20/21 0831 11/20/21 1010        Time Spent in minutes  Bogue Chitto DO on 11/25/2021 at 12:27 PM      Objective:   Vitals:   11/25/21 0323 11/25/21 0418 11/25/21 0716 11/25/21 1123  BP: 110/70  124/80 (!) 168/85  Pulse: 89  83 91  Resp: 14  11 14   Temp: 97.7 F (36.5 C)  97.7 F (36.5 C) 98.4 F (36.9 C)  TempSrc: Oral  Oral Oral  SpO2: 92%  96% 92%  Weight:  68 kg    Height:        Wt Readings from Last 3 Encounters:  11/25/21 68 kg  11/08/21 71.5 kg  11/02/21 66.8 kg     Intake/Output Summary (Last 24 hours) at 11/25/2021 1227 Last data filed at 11/25/2021 0957 Gross per 24  hour  Intake 1660 ml  Output 10 ml  Net 1650 ml     Physical Exam   In bed, NAD      RN pressure injury documentation: Pressure Injury 11/12/21 Coccyx Right;Upper Stage 2 -  Partial thickness loss of dermis presenting as a shallow open injury with a red, pink wound bed without slough. oval shaped opening with smooth edges and pink/red center (Active)  11/12/21 2100  Location: Coccyx  Location Orientation: Right;Upper  Staging: Stage 2 -  Partial thickness loss of dermis presenting as a shallow open injury with a red, pink wound bed  without slough.  Wound Description (Comments): oval shaped opening with smooth edges and pink/red center  Present on Admission: Yes     Data Review:    CBC Recent Labs  Lab 11/19/21 1619 11/19/21 2245 11/20/21 3875 11/20/21 6433 11/20/21 1308 11/20/21 1639 11/21/21 0045 11/22/21 0845 11/23/21 0624 11/24/21 0356 11/25/21 0249  WBC 19.6*   < > 13.6*  --  15.7*   < > 15.8* 12.8* 10.9* 9.1 7.7  HGB 6.8*   < > 5.2*   < > 9.1*   < > 9.4* 7.9* 7.6* 8.0* 8.9*  HCT 20.0*   < > 15.7*   < > 26.1*   < > 26.5* 24.2* 23.3* 24.3* 26.9*  PLT 228   < > 174  --  205   < > 248 284 278 363 425*  MCV 85.8   < > 85.3  --  82.1   < > 81.3 84.3 85.7 86.2 85.9  MCH 29.2   < > 28.3  --  28.6   < > 28.8 27.5 27.9 28.4 28.4  MCHC 34.0   < > 33.1  --  34.9   < > 35.5 32.6 32.6 32.9 33.1  RDW 21.1*   < > 18.4*  --  17.2*   < > 18.3* 18.6* 18.3* 18.0* 17.5*  LYMPHSABS 0.9  --  0.8  --  0.4*  --  0.5*  --  0.6*  --   --   MONOABS 2.3*  --  1.8*  --  0.9  --  1.6*  --  1.6*  --   --   EOSABS 0.1  --  0.1  --  0.1  --  0.0  --  0.1  --   --   BASOSABS 0.0  --  0.0  --  0.0  --  0.0  --  0.0  --   --    < > = values in this interval not displayed.    Electrolytes Recent Labs  Lab 11/19/21 0452 11/19/21 0950 11/20/21 0928 11/20/21 1050 11/22/21 0845 11/24/21 0356 11/24/21 0819 11/25/21 0249  NA 130*  --    < > 129* 132* 128* 126* 128*  K 4.4  --    < > 5.1 4.9 4.1 3.7 3.3*  CL 95*  --    < > 93* 95* 91* 91* 94*  CO2 25  --   --   --  23 23 22 24   GLUCOSE 71  --    < > 82 70 51* 57* 107*  BUN 39*  --    < > 69* 53* 36* 37* 20  CREATININE 4.12*  --    < > 5.10* 5.26* 4.91* 5.04* 3.64*  CALCIUM 7.6*  --   --   --  7.4* 7.5* 7.5* 7.5*  ALBUMIN 1.8*  --   --   --   --  1.6* 1.5*  --   TSH  --  3.168  --   --   --   --   --   --    < > = values in this interval not displayed.     ------------------------------------------------------------------------------------------------------------------ ID  Labs Recent Labs  Lab 11/20/21 1050 11/20/21 1308 11/21/21 0045 11/22/21 0845 11/23/21 3532 11/24/21 0356 11/24/21 0819 11/25/21 0249  WBC  --    < > 15.8* 12.8* 10.9* 9.1  --  7.7  PLT  --    < > 248 284 278 363  --  425*  CREATININE 5.10*  --   --  5.26*  --  4.91* 5.04* 3.64*   < > = values in this interval not displayed.    Radiology Reports DG Abd Portable 1V  Result Date: 11/21/2021 CLINICAL DATA:  NG tube placement EXAM: PORTABLE ABDOMEN - 1 VIEW COMPARISON:  11/17/2021 FINDINGS: Tip of enteric tube is seen within the stomach. There is possible surgical drain in the right upper abdomen. There is possible cholecystostomy catheter in the right upper quadrant. Skin staples are seen. There are linear densities in the lower lung fields suggesting subsegmental atelectasis. Transverse diameter of heart is increased. IMPRESSION: Tip of enteric tube is seen in the stomach. Electronically Signed   By: Elmer Picker M.D.   On: 11/21/2021 13:44   DG UGI W SINGLE CM (SOL OR THIN BA)  Result Date: 11/24/2021 CLINICAL DATA:  61 year old male with recurrent UGI bleed s/p coil embolization with IR 11/05/21 and 11/16/21. S/p ex lap with suture ligation od duodenal ulcer on the posterior wall on 11/20/21. Request for water soluble UGI to rule out leak. EXAM: DG UGI W SINGLE CM TECHNIQUE: Scout radiograph was obtained. Single contrast examination was performed using water soluble contrast. Total of 100 mL of Omnipaque 300 was injected through NGT. This exam was performed by Aimee Han PA-C, and was supervised and interpreted by Dr. Sabino Dick. FLUOROSCOPY TIME:  Radiation Exposure Index (as provided by the fluoroscopic device): 8.40 mGy If the device does not provide the exposure index: Fluoroscopy Time: 48 seconds COMPARISON:  November 20, 2021. FINDINGS: Scout Radiograph: Midline surgical staples, embolization coil, surgical clip in RLQ, and NGT noted. Esophagus: Not evaluated Esophageal motility:  Not  evaluated Gastroesophageal reflux:  Not evaluated Ingested 22mm barium tablet:  Not given Stomach: Normal appearance Gastric emptying: Normal. Duodenum: Visualized portion of duodenum is unremarkable. Other:  No contrast extravasation is seen to suggest leakage. IMPRESSION: No contrast extravasation is seen from the stomach or duodenum to suggest leakage. Electronically Signed   By: Marijo Conception M.D.   On: 11/24/2021 13:13

## 2021-11-25 NOTE — Progress Notes (Signed)
Physical Therapy Treatment Patient Details Name: Timothy [name] MRN: [id number] DOB: 1961-06-25 Today's Date: 11/25/2021   History of Present Illness 61 y/o male presented to ED on 11/12/21 after being found on floor with rectal bleeding. Multiple recent admissions for CHF volume overload, encephalopathy, pulmonary edema, and GI bleed. CTA showed large pericardial effusion and ascites. EGD on 1/30 demonstrated duodenal ulcer with overlying adherent clot but no signs of active bleeding. Pt underwent exploratory laparotomy and repair of bleeding duodenal ulcer on 2/5.  PMH: ESRD (HD TTS), colon cancer, CHF, polysubstance abuse    PT Comments    Patient needing much encouragement even though already up in bathroom, to  walk to door in the room.  Reports too weak to walk and despite encouragement, continues to limit himself based on general weakness.  Encouraged him he is able to take in some protein with supplemental drinks, but he is eager for "meat" protein.  Discussed where he will go at d/c, and states his home is up two flights of steps and he states he cannot do this.  Reports will try to go stay with his sister in [place], but dialysis has not been set up there.  Patient continues to required skilled acute level PT.  May need follow up STSNF if cannot stay with his sister.   Recommendations for follow up therapy are one component of a multi-disciplinary discharge planning process, led by the attending physician.  Recommendations may be updated based on patient status, additional functional criteria and insurance authorization.  Follow Up Recommendations  Skilled nursing-short term rehab (<3 hours/day)     Assistance Recommended at Discharge Frequent or constant Supervision/Assistance  Patient can return home with the following Help with stairs or ramp for entrance;A little help with walking and/or transfers;Assistance with cooking/housework;Assist for transportation   Equipment  Recommendations  Rollator (4 wheels)    Recommendations for Other Services       Precautions / Restrictions Precautions Precautions: Fall Precaution Comments: JP Bulb to suction     Mobility  Bed Mobility Overal bed mobility: Needs Assistance Bed Mobility: Sit to Supine       Sit to supine: Supervision   General bed mobility comments: sit to supine, increased time & effort, supervision for safety    Transfers Overall transfer level: Needs assistance Equipment used: Rolling walker (2 wheels) Transfers: Sit to/from Stand Sit to Stand: Mod assist           General transfer comment: heavy lifting help off toilet even with use of grabbar    Ambulation/Gait Ambulation/Gait assistance: Supervision, Min assist Gait Distance (Feet): 35 Feet Assistive device: Rolling walker (2 wheels) Gait Pattern/deviations: Step-through pattern, Step-to pattern, Decreased stride length, Knees buckling       General Gait Details: much encouragement to walk to door, still wants to have some "meat" protien before he walks in the hall, stating he cannot do more (started on full liquids today and drinking Nepro).  min A for safety when pt demonstrating fatigue standing at door and knees buckling, initially supervision as pt requesting "let me do it on my own"   Stairs             Wheelchair Mobility    Modified Rankin (Stroke Patients Only)       Balance Overall balance assessment: Needs assistance   Sitting balance-Leahy Scale: Good     Standing balance support: During functional activity, No upper extremity supported Standing balance-Leahy Scale: Fair Standing balance comment: washed hands at  sink, close S for safety                            Cognition Arousal/Alertness: Awake/alert Behavior During Therapy: Flat affect Overall Cognitive Status: Within Functional Limits for tasks assessed                                           Exercises      General Comments General comments (skin integrity, edema, etc.): unhooked from monitor to get to bathroom with nursing, once back on monitor sitting on bed HR 114      Pertinent Vitals/Pain Pain Assessment Faces Pain Scale: Hurts a little bit Pain Location: generalized, more weakness than pain Pain Descriptors / Indicators: Tiring Pain Intervention(s): Monitored during session    Home Living                          Prior Function            PT Goals (current goals can now be found in the care plan section) Progress towards PT goals: Progressing toward goals    Frequency    Min 3X/week      PT Plan Discharge plan needs to be updated    Co-evaluation              AM-PAC PT "6 Clicks" Mobility   Outcome Measure  Help needed turning from your back to your side while in a flat bed without using bedrails?: A Little Help needed moving from lying on your back to sitting on the side of a flat bed without using bedrails?: A Little Help needed moving to and from a bed to a chair (including a wheelchair)?: A Little Help needed standing up from a chair using your arms (e.g., wheelchair or bedside chair)?: A Lot Help needed to walk in hospital room?: A Little Help needed climbing 3-5 steps with a railing? : A Lot 6 Click Score: 16    End of Session   Activity Tolerance: Patient limited by fatigue;Other (comment) (self limited) Patient left: in bed;with call bell/phone within reach   PT Visit Diagnosis: Other abnormalities of gait and mobility (R26.89);Muscle weakness (generalized) (M62.81)     Time: 2482-5003 PT Time Calculation (min) (ACUTE ONLY): 21 min  Charges:  $Gait Training: 8-22 mins                     Magda Kiel, PT Acute Rehabilitation Services [facility]:[phone] [phone] 11/25/2021    Reginia Naas 11/25/2021, 12:23 PM

## 2021-11-25 NOTE — Progress Notes (Signed)
OT Cancellation Note  Patient Details Name: Timothy [name] MRN: [id number] DOB: Oct 18, 1960   Cancelled Treatment:    Reason Eval/Treat Not Completed: Patient declined, no reason specified  Metta Clines 11/25/2021, 1:05 PM

## 2021-11-26 NOTE — Progress Notes (Signed)
Pt back to the unit from dialysis. Pt alert and verbally responsive. Will continue to closely monitor. Francis Gaines Katheline Brendlinger RN   11/26/21 1235  Vitals  Temp 98.4 F (36.9 C)  Temp Source Oral  BP 118/84  MAP (mmHg) 96  BP Location Right Arm  BP Method Automatic  Patient Position (if appropriate) Lying  Pulse Rate 98  Pulse Rate Source Monitor  Resp 16  Level of Consciousness  Level of Consciousness Alert  MEWS COLOR  MEWS Score Color Green  Oxygen Therapy  SpO2 95 %  O2 Device Room Air  MEWS Score  MEWS Temp 0  MEWS Systolic 0  MEWS Pulse 0  MEWS RR 0  MEWS LOC 0  MEWS Score 0

## 2021-11-26 NOTE — Progress Notes (Signed)
[facility] Progress Note   Subjective: Seen in room. Completed dialysis this am. Tolerated 2L UF. No complaints, hungry, wants to eat.   Objective Vitals:   11/26/21 1130 11/26/21 1156 11/26/21 1210 11/26/21 1235  BP: 139/78 133/84 126/89 118/84  Pulse: 91 89 90 98  Resp: 14 13 13 16   Temp:  (!) 96.9 F (36.1 C)  98.4 F (36.9 C)  TempSrc:  Oral  Oral  SpO2:  99%  95%  Weight:  67 kg    Height:          Additional Objective Labs: Basic Metabolic Panel: Recent Labs  Lab 11/22/21 0845 11/24/21 0356 11/24/21 0819 11/25/21 0249  NA 132* 128* 126* 128*  K 4.9 4.1 3.7 3.3*  CL 95* 91* 91* 94*  CO2 23 23 22 24   GLUCOSE 70 51* 57* 107*  BUN 53* 36* 37* 20  CREATININE 5.26* 4.91* 5.04* 3.64*  CALCIUM 7.4* 7.5* 7.5* 7.5*  PHOS 7.2* 6.0* 6.0*  --    CBC: Recent Labs  Lab 11/20/21 1308 11/20/21 1639 11/21/21 0045 11/22/21 0845 11/23/21 0624 11/24/21 0356 11/25/21 0249  WBC 15.7*   < > 15.8* 12.8* 10.9* 9.1 7.7  NEUTROABS 14.2*  --  13.6*  --  8.4*  --   --   HGB 9.1*   < > 9.4* 7.9* 7.6* 8.0* 8.9*  HCT 26.1*   < > 26.5* 24.2* 23.3* 24.3* 26.9*  MCV 82.1   < > 81.3 84.3 85.7 86.2 85.9  PLT 205   < > 248 284 278 363 425*   < > = values in this interval not displayed.   Blood Culture    Component Value Date/Time   SDES PERITONEAL FLUID 06/22/2021 1236   SPECREQUEST ABDOMEN 06/22/2021 1236   CULT  06/22/2021 1236    NO GROWTH 3 DAYS Performed at [facility], [place] [address] [place], [place] [address]    REPTSTATUS 06/26/2021 FINAL 06/22/2021 1236     Physical Exam General: Lying in bed, nontoxic appearing, nad  Heart: RRR No n,r,g  Lungs: Clear bilaterally  Abdomen: soft non-tender Extremities: No LE edema  Dialysis Access: LUE AVF +bruit   Medications:   sodium chloride   Intravenous Once   Chlorhexidine Gluconate Cloth  6 each Topical Daily   darbepoetin (ARANESP) injection - DIALYSIS  150 mcg Intravenous Q Sat-HD    doxercalciferol  2 mcg Intravenous Q T,Th,Sa-HD   feeding supplement (NEPRO CARB STEADY)  237 mL Oral TID BM   heparin injection (subcutaneous)  5,000 Units Subcutaneous Q8H   multivitamin  1 tablet Oral QHS   pantoprazole  40 mg Oral BID   sucralfate  1 g Oral TID WC & HS    OP HD:  [facility]  3h 4min  450/1.5  65kg  2/2 bath  P4   L AVF  Hep none -Hectorol 2 mcg IV TIW -Mircera 150 mcg IV q 2 weeks (last 1/28  > due 2/11) -Venofer 100 mg IV weekly   Assessment/Plan: Recurrent GI bleed: sp EGD 1/21 that showed active bleeding from duodenal ulcer that could not be controlled, so pt sent to IR and had embolization of the GDA.  He was dc'd on 1/25. 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 Had further rebleeding here and went to OR 2/05 gen surg  w/ ex-lap, LOA and surgical ulcer  repair.  Per GI/ surgery/ pmd. Now advancing diet.  ESRD: cont HD TTS. Had HD today.  Anemia (ABLA + ESRD):  total 14U PRBCs + 2U FFP this admit. Surgical repair 2/05. Next esa due 2/11, have ordered darbe 150 q Sat 1st dose 2/11. Hgb 8.9 stable  MBD: Corr Ca ok.  Continue Renvela and VDRA. HTN/volume: 2kg up, no vol excess on exam. BP's lower today. UF goal 2 L  Nutrition: Albumin very low (1.8) - give protein supplements as tolerated. Advancing diet as tolerated.     Lynnda Child PA-C [facility] 11/26/2021,2:35 PM

## 2021-11-26 NOTE — Progress Notes (Signed)
PROGRESS NOTE                                                                                                                                                                                                             Patient Demographics:    Timothy [name], is a 61 y.o. male, DOB - 07/18/1961, VOZ:[id number]  Outpatient Primary MD for the patient is [name], MD    LOS - 94  Admit date - 11/12/2021    Chief Complaint  Patient presents with   Rectal Bleeding       Brief Narrative (HPI from H&P)   61 year old gentleman with history of ESRD TTS schedule, GERD with duodenal ulcer, hypertension, chronic pain on oxycodone who was brought in by EMS after he was found to have large rectal bleeding with acute blood loss related anemia, he was found to have a hemoglobin of 5.1 in the ER and admitted to ICU.  He required multiple units of packed RBC transfusion, he was seen by GI underwent EGD on 11/16/2021 showing a duodenal ulcer with spurting blood and adherent clot, IR was consulted and he subsequently underwent embolization on 11/16/2021.  H&H stabilized somewhat and he was transferred to hospitalist service on 11/19/2021.   EXPLORATORY LAPAROTOMY, REPAIR OF BLEEDING DUODENAL ULCER 11/20/2021.    Subjective:   Hungry- no issues overnight, asking for "meat"    Assessment  & Plan :    Assessment and Plan:   Acute blood loss related anemia due to upper GI acute duodenal bleed  - he has received multiple units of packed RBCs in ICU, as well as getting 3 units of packed RBC between 11/19/2021 and 11/20/2018.  He is s/p EGD showing actively bleeding duodenal ulcer he is s/p embolization by IR on 11/16/2021.  Unfortunately night of 11/19/2021 he had a massive bloody bowel movement after which his H&H dropped again, requiring multiple units of packed RBC transfusion, general surgery saw the patient and took him to the OR for emergent  laparotomy with suturing of his duodenal ulcer on 11/20/2021.   -will monitor clinically, defer management of this issue to CCS and GI, transfuse as needed  -upper GI 2/9- no leaks -diet advanced to soft  Hyponatremia -trending up   ESRD.  On TTS schedule.   -renal following  Paroxysmal A. fib RVR.   -  new finding in the setting of severe anemia and acute illness.  He was briefly on diltiazem drip.  He is since converted to sinus rhythm in ICU  -Mali vas 2 score will be greater than 2.   - extremely poor candidate for anticoagulation due to #1 above.  - follow-up with cardiology outpatient within 1 to 2 weeks of discharge  HTN.   -Blood pressure is stabilizing we will place him on IV Lopressor and hydralazine due to n.p.o. status.  Past history of colon cancer.   -Follow with PCP and primary GI post discharge.  Chronic pain and remote history of substance abuse.   -Monitor with supportive care.   TOC consult for d/c planning- SNF vs sister's house in [place]  Code Status :  Full  Consults  :  Renal, GI, IR, PCCM, CCS  PUD Prophylaxis : PPI   Procedures  :     IR - Angiogram 11/16/21 - 1. No evidence of active extravasation about the duodenum, however multifocal vessel irregularity was observed. 2. Technically successful Gel-Foam slurry embolization of the anterior and posterior pancreaticoduodenal arcades.        Status is: Inpatient - home in 24-48 hours   DVT Prophylaxis  :    heparin injection 5,000 Units Start: 11/24/21 1500 Place TED hose Start: 11/19/21 0900 SCDs Start: 11/12/21 2005    Inpatient Medications  Scheduled Meds:  sodium chloride   Intravenous Once   Chlorhexidine Gluconate Cloth  6 each Topical Daily   darbepoetin (ARANESP) injection - DIALYSIS  150 mcg Intravenous Q Sat-HD   doxercalciferol  2 mcg Intravenous Q T,Th,Sa-HD   feeding supplement (NEPRO CARB STEADY)  237 mL Oral TID BM   heparin injection  (subcutaneous)  5,000 Units Subcutaneous Q8H   multivitamin  1 tablet Oral QHS   pantoprazole  40 mg Oral BID   sucralfate  1 g Oral TID WC & HS   Continuous Infusions:   PRN Meds:.acetaminophen, dextrose, hydrALAZINE, HYDROmorphone (DILAUDID) injection, ipratropium-albuterol, metoprolol tartrate, oxyCODONE  Antibiotics  :    Anti-infectives (From admission, onward)    Start     Dose/Rate Route Frequency Ordered Stop   11/20/21 0930  ceFAZolin (ANCEF) IVPB 2g/100 mL premix       See Hyperspace for full Linked Orders Report.   2 g 200 mL/hr over 30 Minutes Intravenous On call to O.R. 11/20/21 0831 11/20/21 1005   11/20/21 0930  metroNIDAZOLE (FLAGYL) IVPB 500 mg       See Hyperspace for full Linked Orders Report.   500 mg 100 mL/hr over 60 Minutes Intravenous On call to O.R. 11/20/21 0831 11/20/21 1010        Time Spent in minutes  Swift DO on 11/26/2021 at 8:33 AM      Objective:   Vitals:   11/25/21 1959 11/25/21 2355 11/26/21 0347 11/26/21 0500  BP: 115/72 118/80 127/82   Pulse: 86 91    Resp: 15 16 14    Temp: 98.3 F (36.8 C) 98 F (36.7 C) 97.8 F (36.6 C)   TempSrc: Oral Oral Oral   SpO2: 93% 90% 95%   Weight:    75.6 kg  Height:        Wt Readings from Last 3 Encounters:  11/26/21 75.6 kg  11/08/21 71.5 kg  11/02/21 66.8 kg     Intake/Output Summary (Last 24 hours) at 11/26/2021 6759 Last data  filed at 11/26/2021 0349 Gross per 24 hour  Intake 480 ml  Output 35 ml  Net 445 ml     Physical Exam    General: Appearance:    Chronically ill appearing male in no acute distress     Lungs:      respirations unlabored  Heart:    Normal heart rate.    MS:   All extremities are intact.    Neurologic:   Awake, alert         RN pressure injury documentation: Pressure Injury 11/12/21 Coccyx Right;Upper Stage 2 -  Partial thickness loss of dermis presenting as a shallow open injury with a red, pink wound bed without slough. oval  shaped opening with smooth edges and pink/red center (Active)  11/12/21 2100  Location: Coccyx  Location Orientation: Right;Upper  Staging: Stage 2 -  Partial thickness loss of dermis presenting as a shallow open injury with a red, pink wound bed without slough.  Wound Description (Comments): oval shaped opening with smooth edges and pink/red center  Present on Admission: Yes     Data Review:    CBC Recent Labs  Lab 11/19/21 1619 11/19/21 2245 11/20/21 2703 11/20/21 5009 11/20/21 1308 11/20/21 1639 11/21/21 0045 11/22/21 0845 11/23/21 0624 11/24/21 0356 11/25/21 0249  WBC 19.6*   < > 13.6*  --  15.7*   < > 15.8* 12.8* 10.9* 9.1 7.7  HGB 6.8*   < > 5.2*   < > 9.1*   < > 9.4* 7.9* 7.6* 8.0* 8.9*  HCT 20.0*   < > 15.7*   < > 26.1*   < > 26.5* 24.2* 23.3* 24.3* 26.9*  PLT 228   < > 174  --  205   < > 248 284 278 363 425*  MCV 85.8   < > 85.3  --  82.1   < > 81.3 84.3 85.7 86.2 85.9  MCH 29.2   < > 28.3  --  28.6   < > 28.8 27.5 27.9 28.4 28.4  MCHC 34.0   < > 33.1  --  34.9   < > 35.5 32.6 32.6 32.9 33.1  RDW 21.1*   < > 18.4*  --  17.2*   < > 18.3* 18.6* 18.3* 18.0* 17.5*  LYMPHSABS 0.9  --  0.8  --  0.4*  --  0.5*  --  0.6*  --   --   MONOABS 2.3*  --  1.8*  --  0.9  --  1.6*  --  1.6*  --   --   EOSABS 0.1  --  0.1  --  0.1  --  0.0  --  0.1  --   --   BASOSABS 0.0  --  0.0  --  0.0  --  0.0  --  0.0  --   --    < > = values in this interval not displayed.    Electrolytes Recent Labs  Lab 11/19/21 0950 11/20/21 0928 11/20/21 1050 11/22/21 0845 11/24/21 0356 11/24/21 0819 11/25/21 0249  NA  --    < > 129* 132* 128* 126* 128*  K  --    < > 5.1 4.9 4.1 3.7 3.3*  CL  --    < > 93* 95* 91* 91* 94*  CO2  --   --   --  23 23 22 24   GLUCOSE  --    < > 82 70 51* 57* 107*  BUN  --    < >  69* 53* 36* 37* 20  CREATININE  --    < > 5.10* 5.26* 4.91* 5.04* 3.64*  CALCIUM  --   --   --  7.4* 7.5* 7.5* 7.5*  ALBUMIN  --   --   --   --  1.6* 1.5*  --   TSH 3.168  --   --    --   --   --   --    < > = values in this interval not displayed.     ------------------------------------------------------------------------------------------------------------------ ID Labs Recent Labs  Lab 11/20/21 1050 11/20/21 1308 11/21/21 0045 11/22/21 0845 11/23/21 6294 11/24/21 0356 11/24/21 0819 11/25/21 0249  WBC  --    < > 15.8* 12.8* 10.9* 9.1  --  7.7  PLT  --    < > 248 284 278 363  --  425*  CREATININE 5.10*  --   --  5.26*  --  4.91* 5.04* 3.64*   < > = values in this interval not displayed.    Radiology Reports DG UGI W SINGLE CM (SOL OR THIN BA)  Result Date: 11/24/2021 CLINICAL DATA:  60 year old male with recurrent UGI bleed s/p coil embolization with IR 11/05/21 and 11/16/21. S/p ex lap with suture ligation od duodenal ulcer on the posterior wall on 11/20/21. Request for water soluble UGI to rule out leak. EXAM: DG UGI W SINGLE CM TECHNIQUE: Scout radiograph was obtained. Single contrast examination was performed using water soluble contrast. Total of 100 mL of Omnipaque 300 was injected through NGT. This exam was performed by Aimee Han PA-C, and was supervised and interpreted by Dr. Sabino Dick. FLUOROSCOPY TIME:  Radiation Exposure Index (as provided by the fluoroscopic device): 8.40 mGy If the device does not provide the exposure index: Fluoroscopy Time: 48 seconds COMPARISON:  November 20, 2021. FINDINGS: Scout Radiograph: Midline surgical staples, embolization coil, surgical clip in RLQ, and NGT noted. Esophagus: Not evaluated Esophageal motility:  Not evaluated Gastroesophageal reflux:  Not evaluated Ingested 92mm barium tablet:  Not given Stomach: Normal appearance Gastric emptying: Normal. Duodenum: Visualized portion of duodenum is unremarkable. Other:  No contrast extravasation is seen to suggest leakage. IMPRESSION: No contrast extravasation is seen from the stomach or duodenum to suggest leakage. Electronically Signed   By: Marijo Conception M.D.   On: 11/24/2021  13:13

## 2021-11-26 NOTE — Progress Notes (Signed)
Subjective Hungry. Pain controlled.   Objective: Vital signs in last 24 hours: Temp:  [97.8 F (36.6 C)-98.4 F (36.9 C)] 97.8 F (36.6 C) (02/11 0347) Pulse Rate:  [86-91] 91 (02/10 2355) Resp:  [12-16] 14 (02/11 0347) BP: (107-168)/(72-85) 127/82 (02/11 0347) SpO2:  [90 %-95 %] 95 % (02/11 0347) Weight:  [75.6 kg] 75.6 kg (02/11 0500) Last BM Date: 11/25/21  Intake/Output from previous day: 02/10 0701 - 02/11 0700 In: 840 [P.O.:840] Out: 35 [Drains:35] Intake/Output this shift: No intake/output data recorded.  Gen: NAD, comfortable CV: RRR Pulm: Normal work of breathing Abd: Soft, no TTP, no distension. Staples in midline incision c/d/I - no surrounding erythema or induration. JP serosang Ext: SCDs in place. Calves soft and nontender bilaterally  Lab Results: CBC  Recent Labs    11/24/21 0356 11/25/21 0249  WBC 9.1 7.7  HGB 8.0* 8.9*  HCT 24.3* 26.9*  PLT 363 425*    BMET Recent Labs    11/24/21 0819 11/25/21 0249  NA 126* 128*  K 3.7 3.3*  CL 91* 94*  CO2 22 24  GLUCOSE 57* 107*  BUN 37* 20  CREATININE 5.04* 3.64*  CALCIUM 7.5* 7.5*    PT/INR No results for input(s): LABPROT, INR in the last 72 hours. ABG No results for input(s): PHART, HCO3 in the last 72 hours.  Invalid input(s): PCO2, PO2  Studies/Results:  Anti-infectives: Anti-infectives (From admission, onward)    Start     Dose/Rate Route Frequency Ordered Stop   11/20/21 0930  ceFAZolin (ANCEF) IVPB 2g/100 mL premix       See Hyperspace for full Linked Orders Report.   2 g 200 mL/hr over 30 Minutes Intravenous On call to O.R. 11/20/21 0831 11/20/21 1005   11/20/21 0930  metroNIDAZOLE (FLAGYL) IVPB 500 mg       See Hyperspace for full Linked Orders Report.   500 mg 100 mL/hr over 60 Minutes Intravenous On call to O.R. 11/20/21 0831 11/20/21 1010        Assessment/Plan: Patient Active Problem List   Diagnosis Date Noted   Hemorrhagic shock ([place])    Rectal bleeding     Duodenal ulcer with hemorrhage    Upper GI bleed 11/05/2021   GI bleed 11/05/2021   ESRD (end stage renal disease) on dialysis ([place]) 11/01/2021   Hyperkalemia 11/01/2021   Volume overload 10/31/2021   SOB (shortness of breath) 10/31/2021   Acute hyponatremia 10/31/2021   Multifocal pneumonia 10/06/2021   Hypervolemia associated with renal insufficiency 10/05/2021   Pericardial effusion without cardiac tamponade 10/05/2021   Chronic combined systolic and diastolic CHF (congestive heart failure) ([place]) 10/05/2021   Elevated procalcitonin 10/05/2021   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/2021   Acute postoperative pain of abdomen    Renal mass    Major depressive disorder, single episode, severe ([place]) 06/18/2021   Bacterial peritonitis ([place])    Other ascites    Protein-calorie malnutrition, severe 06/13/2021   Ileus ([place]) 06/11/2021   Small bowel obstruction ([place]) 05/20/2021   DOE (dyspnea on exertion) 10/25/2020   Orthopnea 10/25/2020   Open wound of left hand 01/18/2018   Tibial fracture 01/14/2018   Tibial plateau fracture, left 01/13/2018   Malnutrition of moderate degree 09/20/2016   AKI (acute kidney injury) ([place])    History of colon cancer    Hyponatremia    Leukocytosis    Acute blood loss anemia    Acute metabolic encephalopathy 71/69/6789   ESRD on hemodialysis ([place]) 09/09/2016  Hypertensive emergency 09/09/2016   Acute respiratory failure with hypoxia ([place]) 09/09/2016   Acute pulmonary edema ([place]) 09/09/2016   Substance abuse ([place]) 09/09/2016   Nonadherence to medical treatment 09/09/2016   Anemia due to chronic kidney disease 09/09/2016   Altered mental status 09/09/2016   Acute respiratory failure ([place])    Drug ingestion    Hypertensive heart and chronic kidney disease with heart failure and with stage 5 chronic kidney disease, or end stage renal disease ([place]) 07/19/2016   Chest pain    Elevated troponin 93/81/0175   Acute diastolic heart failure, NYHA class 2  ([place]) 07/17/2016   Heroin abuse ([place]) 07/17/2016   HTN (hypertension) 07/17/2016   Benign prostate hyperplasia 07/16/2015   s/p Procedure(s): EXPLORATORY LAPAROTOMY, REPAIR OF BLEEDING DUODENAL ULCER 11/20/2021 by Dr. Zenia Resides - POD#6. Afebrile, VSS  - hgb stable  - UGI 2/9 without evidence of leak. Advance to soft diet.  - transition to PO pain meds -JP to bulb suction, SS, continue -BID PPI -Ambulate 5x/day - PT/OT  FEN: FLD, nepro shakes ID: ancef/flagyl periop VTE: SCDs, subq hep   LOS: 14 days   Clovis Riley, MD [facility] 11/26/2021, 8:26 AM Please see Amion for pager number during day hours 7:00am-4:30pm

## 2021-11-26 NOTE — Progress Notes (Signed)
Patient transported off unit to dialysis. 

## 2021-11-27 LAB — CBC
HCT: 22.7 % — ABNORMAL LOW (ref 39.0–52.0)
Hemoglobin: 7.2 g/dL — ABNORMAL LOW (ref 13.0–17.0)
MCH: 27.8 pg (ref 26.0–34.0)
MCHC: 31.7 g/dL (ref 30.0–36.0)
MCV: 87.6 fL (ref 80.0–100.0)
Platelets: 371 10*3/uL (ref 150–400)
RBC: 2.59 MIL/uL — ABNORMAL LOW (ref 4.22–5.81)
RDW: 17.2 % — ABNORMAL HIGH (ref 11.5–15.5)
WBC: 7.5 10*3/uL (ref 4.0–10.5)
nRBC: 0 % (ref 0.0–0.2)

## 2021-11-27 LAB — BASIC METABOLIC PANEL
Anion gap: 10 (ref 5–15)
BUN: 24 mg/dL — ABNORMAL HIGH (ref 6–20)
CO2: 26 mmol/L (ref 22–32)
Calcium: 7.4 mg/dL — ABNORMAL LOW (ref 8.9–10.3)
Chloride: 96 mmol/L — ABNORMAL LOW (ref 98–111)
Creatinine, Ser: 4.8 mg/dL — ABNORMAL HIGH (ref 0.61–1.24)
GFR, Estimated: 13 mL/min — ABNORMAL LOW (ref >60)
Glucose, Bld: 101 mg/dL — ABNORMAL HIGH (ref 70–99)
Potassium: 3.3 mmol/L — ABNORMAL LOW (ref 3.5–5.1)
Sodium: 132 mmol/L — ABNORMAL LOW (ref 135–145)

## 2021-11-27 MED ORDER — BISACODYL 10 MG RE SUPP
10.0000 mg | Freq: Once | RECTAL | Status: AC
  Administered 2021-11-27: 10 mg via RECTAL
  Filled 2021-11-27: qty 1

## 2021-11-27 MED ORDER — HYDROMORPHONE HCL 1 MG/ML IJ SOLN
0.5000 mg | INTRAMUSCULAR | Status: DC | PRN
  Filled 2021-11-27 (×2): qty 0.5

## 2021-11-27 MED ORDER — BISACODYL 10 MG RE SUPP
10.0000 mg | Freq: Every day | RECTAL | Status: DC | PRN
  Administered 2021-11-28 – 2021-11-29 (×2): 10 mg via RECTAL
  Filled 2021-11-27 (×2): qty 1

## 2021-11-27 MED ORDER — OXYCODONE HCL 5 MG PO TABS
5.0000 mg | ORAL_TABLET | Freq: Four times a day (QID) | ORAL | Status: DC | PRN
  Administered 2021-11-27: 10 mg via ORAL
  Filled 2021-11-27: qty 2

## 2021-11-27 MED ORDER — POLYETHYLENE GLYCOL 3350 17 G PO PACK
17.0000 g | PACK | Freq: Every day | ORAL | Status: DC
  Administered 2021-11-27 – 2021-12-02 (×4): 17 g via ORAL
  Filled 2021-11-27 (×6): qty 1

## 2021-11-27 MED ORDER — OXYCODONE HCL 5 MG PO TABS
5.0000 mg | ORAL_TABLET | ORAL | Status: DC | PRN
  Administered 2021-11-27 – 2021-12-02 (×25): 5 mg via ORAL
  Filled 2021-11-27 (×25): qty 1

## 2021-11-27 NOTE — Progress Notes (Addendum)
Subjective Had a decent day yesterday, tolerating soft diet and denies abdominal pain but requesting stool softener  Objective: Vital signs in last 24 hours: Temp:  [96.9 F (36.1 C)-98.8 F (37.1 C)] 97.8 F (36.6 C) (02/12 0743) Pulse Rate:  [84-98] 86 (02/12 0743) Resp:  [13-20] 17 (02/12 0743) BP: (110-139)/(69-91) 126/69 (02/12 0743) SpO2:  [92 %-99 %] 95 % (02/12 0743) Weight:  [67 kg-77.6 kg] 77.6 kg (02/12 0406) Last BM Date: 11/26/21  Intake/Output from previous day: 02/11 0701 - 02/12 0700 In: 240 [P.O.:240] Out: 2006 [Drains:6] Intake/Output this shift: No intake/output data recorded.  Gen: NAD, comfortable CV: RRR Pulm: Normal work of breathing Abd: Soft, distended today but the patient reports this is because he just ate. Staples in midline incision c/d/I - no surrounding erythema or induration. JP serosang with minimal output Ext: SCDs in place. Calves soft and nontender bilaterally  Lab Results: CBC  Recent Labs    11/25/21 0249 11/27/21 0353  WBC 7.7 7.5  HGB 8.9* 7.2*  HCT 26.9* 22.7*  PLT 425* 371    BMET Recent Labs    11/25/21 0249 11/27/21 0353  NA 128* 132*  K 3.3* 3.3*  CL 94* 96*  CO2 24 26  GLUCOSE 107* 101*  BUN 20 24*  CREATININE 3.64* 4.80*  CALCIUM 7.5* 7.4*    PT/INR No results for input(s): LABPROT, INR in the last 72 hours. ABG No results for input(s): PHART, HCO3 in the last 72 hours.  Invalid input(s): PCO2, PO2  Studies/Results:  Anti-infectives: Anti-infectives (From admission, onward)    Start     Dose/Rate Route Frequency Ordered Stop   11/20/21 0930  ceFAZolin (ANCEF) IVPB 2g/100 mL premix       See Hyperspace for full Linked Orders Report.   2 g 200 mL/hr over 30 Minutes Intravenous On call to O.R. 11/20/21 0831 11/20/21 1005   11/20/21 0930  metroNIDAZOLE (FLAGYL) IVPB 500 mg       See Hyperspace for full Linked Orders Report.   500 mg 100 mL/hr over 60 Minutes Intravenous On call to O.R. 11/20/21  0831 11/20/21 1010        Assessment/Plan: Patient Active Problem List   Diagnosis Date Noted   Hemorrhagic shock ([place])    Rectal bleeding    Duodenal ulcer with hemorrhage    Upper GI bleed 11/05/2021   GI bleed 11/05/2021   ESRD (end stage renal disease) on dialysis ([place]) 11/01/2021   Hyperkalemia 11/01/2021   Volume overload 10/31/2021   SOB (shortness of breath) 10/31/2021   Acute hyponatremia 10/31/2021   Multifocal pneumonia 10/06/2021   Hypervolemia associated with renal insufficiency 10/05/2021   Pericardial effusion without cardiac tamponade 10/05/2021   Chronic combined systolic and diastolic CHF (congestive heart failure) ([place]) 10/05/2021   Elevated procalcitonin 10/05/2021   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/2021   Acute postoperative pain of abdomen    Renal mass    Major depressive disorder, single episode, severe ([place]) 06/18/2021   Bacterial peritonitis ([place])    Other ascites    Protein-calorie malnutrition, severe 06/13/2021   Ileus ([place]) 06/11/2021   Small bowel obstruction ([place]) 05/20/2021   DOE (dyspnea on exertion) 10/25/2020   Orthopnea 10/25/2020   Open wound of left hand 01/18/2018   Tibial fracture 01/14/2018   Tibial plateau fracture, left 01/13/2018   Malnutrition of moderate degree 09/20/2016   AKI (acute kidney injury) ([place])    History of colon cancer    Hyponatremia  Leukocytosis    Acute blood loss anemia    Acute metabolic encephalopathy 82/57/4935   ESRD on hemodialysis ([place]) 09/09/2016   Hypertensive emergency 09/09/2016   Acute respiratory failure with hypoxia ([place]) 09/09/2016   Acute pulmonary edema ([place]) 09/09/2016   Substance abuse ([place]) 09/09/2016   Nonadherence to medical treatment 09/09/2016   Anemia due to chronic kidney disease 09/09/2016   Altered mental status 09/09/2016   Acute respiratory failure ([place])    Drug ingestion    Hypertensive heart and chronic kidney disease with heart failure and with stage 5 chronic  kidney disease, or end stage renal disease ([place]) 07/19/2016   Chest pain    Elevated troponin 52/17/4715   Acute diastolic heart failure, NYHA class 2 ([place]) 07/17/2016   Heroin abuse ([place]) 07/17/2016   HTN (hypertension) 07/17/2016   Benign prostate hyperplasia 07/16/2015   s/p Procedure(s): EXPLORATORY LAPAROTOMY, REPAIR OF BLEEDING DUODENAL ULCER 11/20/2021 by Dr. Zenia Resides - POD#7. Afebrile, VSS  - hgb stable  - UGI 2/9 without evidence of leak.  Continue soft diet.  We will add bowel regimen - transition to PO pain meds -Remove drain today. -BID PPI -Ambulate 5x/day - PT/OT  Okay for discharge from surgery standpoint when disposition clear, will need to follow-up in our office this week for staple removal  FEN: FLD, nepro shakes ID: ancef/flagyl periop VTE: SCDs, subq hep   LOS: 15 days   Clovis Riley, MD [facility] 11/27/2021, 8:47 AM Please see Amion for pager number during day hours 7:00am-4:30pm

## 2021-11-27 NOTE — NC FL2 (Signed)
[place] LEVEL OF CARE SCREENING TOOL     IDENTIFICATION  Patient Name: Timothy [name] Birthdate: Dec 21, 1960 Sex: male Admission Date (Current Location): 11/12/2021  [facility] and [place] Number:  Herbalist and Address:  [facility], [place] [address], [place], [place] [address]      Provider Number: [id number]  Attending Physician Name and Address:  Geradine Girt, DO  Relative Name and Phone Number:  daughter, [phone]    Current Level of Care: Hospital Recommended Level of Care: [place] Prior Approval Number:    Date Approved/Denied:   PASRR Number:    Discharge Plan: SNF    Current Diagnoses: Patient Active Problem List   Diagnosis Date Noted   Hemorrhagic shock ([place])    Rectal bleeding    Duodenal ulcer with hemorrhage    Upper GI bleed 11/05/2021   GI bleed 11/05/2021   ESRD (end stage renal disease) on dialysis ([place]) 11/01/2021   Hyperkalemia 11/01/2021   Volume overload 10/31/2021   SOB (shortness of breath) 10/31/2021   Acute hyponatremia 10/31/2021   Multifocal pneumonia 10/06/2021   Hypervolemia associated with renal insufficiency 10/05/2021   Pericardial effusion without cardiac tamponade 10/05/2021   Chronic combined systolic and diastolic CHF (congestive heart failure) ([place]) 10/05/2021   Elevated procalcitonin 10/05/2021   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/2021   Acute postoperative pain of abdomen    Renal mass    Major depressive disorder, single episode, severe ([place]) 06/18/2021   Bacterial peritonitis ([place])    Other ascites    Protein-calorie malnutrition, severe 06/13/2021   Ileus ([place]) 06/11/2021   Small bowel obstruction ([place]) 05/20/2021   DOE (dyspnea on exertion) 10/25/2020   Orthopnea 10/25/2020   Open wound of left hand 01/18/2018   Tibial fracture 01/14/2018   Tibial plateau fracture, left 01/13/2018   Malnutrition of moderate degree 09/20/2016   AKI (acute kidney  injury) ([place])    History of colon cancer    Hyponatremia    Leukocytosis    Acute blood loss anemia    Acute metabolic encephalopathy 53/66/4403   ESRD on hemodialysis ([place]) 09/09/2016   Hypertensive emergency 09/09/2016   Acute respiratory failure with hypoxia ([place]) 09/09/2016   Acute pulmonary edema ([place]) 09/09/2016   Substance abuse ([place]) 09/09/2016   Nonadherence to medical treatment 09/09/2016   Anemia due to chronic kidney disease 09/09/2016   Altered mental status 09/09/2016   Acute respiratory failure (HCC)    Drug ingestion    Hypertensive heart and chronic kidney disease with heart failure and with stage 5 chronic kidney disease, or end stage renal disease ([place]) 07/19/2016   Chest pain    Elevated troponin 47/42/5956   Acute diastolic heart failure, NYHA class 2 ([place]) 07/17/2016   Heroin abuse ([place]) 07/17/2016   HTN (hypertension) 07/17/2016   Benign prostate hyperplasia 07/16/2015    Orientation RESPIRATION BLADDER Height & Weight     Self, Time, Situation, Place  Normal Continent Weight: 171 lb 1.2 oz (77.6 kg) Height:  6\' 1"  (185.4 cm)  BEHAVIORAL SYMPTOMS/MOOD NEUROLOGICAL BOWEL NUTRITION STATUS      Continent Diet (soft, fluid thin)  AMBULATORY STATUS COMMUNICATION OF NEEDS Skin   Limited Assist Verbally                         Personal Care Assistance Level of Assistance  Bathing, Feeding, Dressing Bathing Assistance: Limited assistance Feeding assistance: Independent Dressing Assistance: Limited  assistance     Functional Limitations Info             SPECIAL CARE FACTORS FREQUENCY  OT (By licensed OT), PT (By licensed PT)     PT Frequency: 5x weekly OT Frequency: 5x weekly            Contractures Contractures Info: Not present    Additional Factors Info  Code Status, Allergies Code Status Info: full Allergies Info: NKDA           Current Medications (11/27/2021):  This is the current hospital active medication list Current  Facility-Administered Medications  Medication Dose Route Frequency Provider Last Rate Last Admin   0.9 %  sodium chloride infusion (Manually program via Guardrails IV Fluids)   Intravenous Once Jose Persia, MD       acetaminophen (TYLENOL) tablet 650 mg  650 mg Oral Q6H PRN Winferd Humphrey, PA-C   650 mg at 11/25/21 1037   bisacodyl (DULCOLAX) suppository 10 mg  10 mg Rectal Once Clovis Riley, MD       bisacodyl (DULCOLAX) suppository 10 mg  10 mg Rectal Daily PRN Clovis Riley, MD       Chlorhexidine Gluconate Cloth 2 % PADS 6 each  6 each Topical Daily Juanito Doom, MD   6 each at 11/27/21 3614   Darbepoetin Alfa (ARANESP) injection 150 mcg  150 mcg Intravenous Q Sat-HD Roney Jaffe, MD   150 mcg at 11/26/21 1154   dextrose 50 % solution 50 mL  1 ampule Intravenous Once PRN Eulogio Bear U, DO       doxercalciferol (HECTOROL) injection 2 mcg  2 mcg Intravenous Q T,Th,Sa-HD Valentina Gu, NP   2 mcg at 11/26/21 1154   feeding supplement (NEPRO CARB STEADY) liquid 237 mL  237 mL Oral TID BM Vann, Jessica U, DO   237 mL at 11/27/21 0926   hydrALAZINE (APRESOLINE) injection 10 mg  10 mg Intravenous Q4H PRN Jose Persia, MD   10 mg at 11/24/21 1548   HYDROmorphone (DILAUDID) injection 0.5 mg  0.5 mg Intravenous Q4H PRN Romana Juniper A, MD       ipratropium-albuterol (DUONEB) 0.5-2.5 (3) MG/3ML nebulizer solution 3 mL  3 mL Nebulization Q4H PRN Wynetta Fines T, MD   3 mL at 11/14/21 2027   metoprolol tartrate (LOPRESSOR) injection 5 mg  5 mg Intravenous Q4H PRN Thurnell Lose, MD       multivitamin (RENA-VIT) tablet 1 tablet  1 tablet Oral QHS Eulogio Bear U, DO   1 tablet at 11/26/21 2054   oxyCODONE (Oxy IR/ROXICODONE) immediate release tablet 5-10 mg  5-10 mg Oral Q6H PRN Clovis Riley, MD       pantoprazole (PROTONIX) EC tablet 40 mg  40 mg Oral BID Winferd Humphrey, PA-C   40 mg at 11/27/21 0747   polyethylene glycol (MIRALAX / GLYCOLAX) packet 17 g  17 g  Oral Daily Romana Juniper A, MD       sucralfate (CARAFATE) 1 GM/10ML suspension 1 g  1 g Oral TID WC & HS Blount, Lolita Cram, NP   1 g at 11/27/21 4315     Discharge Medications: Please see discharge summary for a list of discharge medications.  Relevant Imaging Results:  Relevant Lab Results:   Additional Information SSN: [id number],  Alfredia Ferguson, LCSW

## 2021-11-27 NOTE — TOC Progression Note (Signed)
Transition of Care [name]) - Progression Note    Patient Details  Name: Timothy [name] MRN: [id number] Date of Birth: [birth year]  Transition of Care [facility]) CM/SW [place], [place] Phone Number: 11/27/2021, 10:56 AM  Clinical Narrative:    CSW contacted by attending that patient reported he wanted to go back to his sister's (patient told attending [name] but [name] to CSW). CSW spoke with RN regarding patient status and was informed patient has been open to going to [place] and PT is going to re-evaluate. CSW met with patient to discuss disposition and patient expressed if SNF continues to be the recommendation he would prefer that route but otherwise would want to return to his sister's. CSW will proceed with current SNF recommendation but if PT changes recommendation renal navigator will need to be contacted regarding moving his dialysis.    Expected Discharge Plan: Skilled Nursing Facility Barriers to Discharge: SNF Pending bed offer  Expected Discharge Plan and Services Expected Discharge Plan: [place] arrangements for the past 2 months: Apartment                                       Social Determinants of Health (SDOH) Interventions    Readmission Risk Interventions Readmission Risk Prevention Plan 11/16/2021 11/09/2021  Transportation Screening - Complete  Medication Review (RN Care Manager) Referral to Pharmacy Referral to Pharmacy  PCP or Specialist appointment within 3-5 days of discharge Not Complete Complete  PCP/Specialist Appt Not Complete comments patient not medically ready to d/c -  [place] or Home Care Consult Not Complete Patient refused  [place] or Home Care Consult Pt Refusal Comments not complete, waiting on PT recs -  SW Recovery Care/Counseling Consult Complete Complete  Palliative Care Screening Not Applicable Not [place] Not Complete Not Applicable  SNF Comments Pending PT  recommendations (CSW following) -  Some recent data might be hidden

## 2021-11-27 NOTE — Progress Notes (Signed)
PROGRESS NOTE                                                                                                                                                                                                             Patient Demographics:    Timothy [name], is a 61 y.o. male, DOB - 1961/05/17, BJY:[id number]  Outpatient Primary MD for the patient is [name], MD    LOS - 15  Admit date - 11/12/2021    Chief Complaint  Patient presents with   Rectal Bleeding       Brief Narrative (HPI from H&P)   61 year old gentleman with history of ESRD TTS schedule, GERD with duodenal ulcer, hypertension, chronic pain on oxycodone who was brought in by EMS after he was found to have large rectal bleeding with acute blood loss related anemia, he was found to have a hemoglobin of 5.1 in the ER and admitted to ICU.  He required multiple units of packed RBC transfusion, he was seen by GI underwent EGD on 11/16/2021 showing a duodenal ulcer with spurting blood and adherent clot, IR was consulted and he subsequently underwent embolization on 11/16/2021.  H&H stabilized somewhat and he was transferred to hospitalist service on 11/19/2021.   EXPLORATORY LAPAROTOMY, REPAIR OF BLEEDING DUODENAL ULCER 11/20/2021.    Subjective:   Tolerating food well Limited pain    Assessment  & Plan :    Assessment and Plan:   Acute blood loss related anemia due to upper GI acute duodenal bleed  - he has received multiple units of packed RBCs in ICU, as well as getting 3 units of packed RBC between 11/19/2021 and 11/20/2018.  He is s/p EGD showing actively bleeding duodenal ulcer he is s/p embolization by IR on 11/16/2021.  Unfortunately night of 11/19/2021 he had a massive bloody bowel movement after which his H&H dropped again, requiring multiple units of packed RBC transfusion, general surgery saw the patient and took him to the OR for emergent laparotomy with  suturing of his duodenal ulcer on 11/20/2021.   -will monitor clinically, defer management of this issue to CCS and GI, transfuse as needed  -upper GI 2/9- no leaks -diet advanced to soft -surgery signed off  Hyponatremia -trending up   ESRD.  On TTS schedule.   -renal following  Paroxysmal A. fib RVR.   -  new finding in the setting of severe anemia and acute illness.  He was briefly on diltiazem drip.  He is since converted to sinus rhythm in ICU  -Mali vas 2 score will be greater than 2.   - extremely poor candidate for anticoagulation due to #1 above.  - follow-up with cardiology outpatient within 1 to 2 weeks of discharge  HTN.   -Blood pressure is stabilizing we will place him on IV Lopressor and hydralazine due to n.p.o. status.  Past history of colon cancer.   -Follow with PCP and primary GI post discharge.  Chronic pain and remote history of substance abuse.   -Monitor with supportive care.   TOC consult for d/c planning- SNF vs sister's house in [place]  Code Status :  Full  Consults  :  Renal, GI, IR, PCCM, CCS  PUD Prophylaxis : PPI   Procedures  :     IR - Angiogram 11/16/21 - 1. No evidence of active extravasation about the duodenum, however multifocal vessel irregularity was observed. 2. Technically successful Gel-Foam slurry embolization of the anterior and posterior pancreaticoduodenal arcades.        Status is: Inpatient - home in 24-48 hours   DVT Prophylaxis  :    Place and maintain sequential compression device Start: 11/27/21 1033 Place TED hose Start: 11/19/21 0900 SCDs Start: 11/12/21 2005    Inpatient Medications  Scheduled Meds:  sodium chloride   Intravenous Once   Chlorhexidine Gluconate Cloth  6 each Topical Daily   darbepoetin (ARANESP) injection - DIALYSIS  150 mcg Intravenous Q Sat-HD   doxercalciferol  2 mcg Intravenous Q T,Th,Sa-HD   feeding supplement (NEPRO CARB STEADY)  237 mL Oral TID BM    multivitamin  1 tablet Oral QHS   pantoprazole  40 mg Oral BID   polyethylene glycol  17 g Oral Daily   sucralfate  1 g Oral TID WC & HS   Continuous Infusions:   PRN Meds:.acetaminophen, bisacodyl, dextrose, hydrALAZINE, HYDROmorphone (DILAUDID) injection, ipratropium-albuterol, metoprolol tartrate, oxyCODONE  Antibiotics  :    Anti-infectives (From admission, onward)    Start     Dose/Rate Route Frequency Ordered Stop   11/20/21 0930  ceFAZolin (ANCEF) IVPB 2g/100 mL premix       See Hyperspace for full Linked Orders Report.   2 g 200 mL/hr over 30 Minutes Intravenous On call to O.R. 11/20/21 0831 11/20/21 1005   11/20/21 0930  metroNIDAZOLE (FLAGYL) IVPB 500 mg       See Hyperspace for full Linked Orders Report.   500 mg 100 mL/hr over 60 Minutes Intravenous On call to O.R. 11/20/21 0831 11/20/21 1010        Time Spent in minutes  [place] DO on 11/27/2021 at 12:26 PM      Objective:   Vitals:   11/27/21 0400 11/27/21 0406 11/27/21 0743 11/27/21 1141  BP: (!) 119/91  126/69 127/79  Pulse: 88  86 86  Resp: 15  17 17   Temp: (!) 97.5 F (36.4 C)  97.8 F (36.6 C) 97.7 F (36.5 C)  TempSrc: Oral  Oral Oral  SpO2: 96%  95% 97%  Weight:  77.6 kg    Height:        Wt Readings from Last 3 Encounters:  11/27/21 77.6 kg  11/08/21 71.5 kg  11/02/21 66.8 kg     Intake/Output Summary (Last 24 hours) at  11/27/2021 1226 Last data filed at 11/27/2021 0900 Gross per 24 hour  Intake 480 ml  Output --  Net 480 ml     Physical Exam     General: Appearance:    Chronically ill appearing male in no acute distress     Lungs:     respirations unlabored  Heart:    Normal heart rate.    MS:   All extremities are intact.    Neurologic:   Awake, alert       RN pressure injury documentation: Pressure Injury 11/12/21 Coccyx Right;Upper Stage 2 -  Partial thickness loss of dermis presenting as a shallow open injury with a red, pink wound bed without  slough. oval shaped opening with smooth edges and pink/red center (Active)  11/12/21 2100  Location: Coccyx  Location Orientation: Right;Upper  Staging: Stage 2 -  Partial thickness loss of dermis presenting as a shallow open injury with a red, pink wound bed without slough.  Wound Description (Comments): oval shaped opening with smooth edges and pink/red center  Present on Admission: Yes     Data Review:    CBC Recent Labs  Lab 11/20/21 1308 11/20/21 1639 11/21/21 0045 11/22/21 0845 11/23/21 3557 11/24/21 0356 11/25/21 0249 11/27/21 0353  WBC 15.7*   < > 15.8* 12.8* 10.9* 9.1 7.7 7.5  HGB 9.1*   < > 9.4* 7.9* 7.6* 8.0* 8.9* 7.2*  HCT 26.1*   < > 26.5* 24.2* 23.3* 24.3* 26.9* 22.7*  PLT 205   < > 248 284 278 363 425* 371  MCV 82.1   < > 81.3 84.3 85.7 86.2 85.9 87.6  MCH 28.6   < > 28.8 27.5 27.9 28.4 28.4 27.8  MCHC 34.9   < > 35.5 32.6 32.6 32.9 33.1 31.7  RDW 17.2*   < > 18.3* 18.6* 18.3* 18.0* 17.5* 17.2*  LYMPHSABS 0.4*  --  0.5*  --  0.6*  --   --   --   MONOABS 0.9  --  1.6*  --  1.6*  --   --   --   EOSABS 0.1  --  0.0  --  0.1  --   --   --   BASOSABS 0.0  --  0.0  --  0.0  --   --   --    < > = values in this interval not displayed.    Electrolytes Recent Labs  Lab 11/22/21 0845 11/24/21 0356 11/24/21 0819 11/25/21 0249 11/27/21 0353  NA 132* 128* 126* 128* 132*  K 4.9 4.1 3.7 3.3* 3.3*  CL 95* 91* 91* 94* 96*  CO2 23 23 22 24 26   GLUCOSE 70 51* 57* 107* 101*  BUN 53* 36* 37* 20 24*  CREATININE 5.26* 4.91* 5.04* 3.64* 4.80*  CALCIUM 7.4* 7.5* 7.5* 7.5* 7.4*  ALBUMIN  --  1.6* 1.5*  --   --      ------------------------------------------------------------------------------------------------------------------ ID Labs Recent Labs  Lab 11/22/21 0845 11/23/21 0624 11/24/21 0356 11/24/21 0819 11/25/21 0249 11/27/21 0353  WBC 12.8* 10.9* 9.1  --  7.7 7.5  PLT 284 278 363  --  425* 371  CREATININE 5.26*  --  4.91* 5.04* 3.64* 4.80*     Radiology Reports DG UGI W SINGLE CM (SOL OR THIN BA)  Result Date: 11/24/2021 CLINICAL DATA:  61 year old male with recurrent UGI bleed s/p coil embolization with IR 11/05/21 and 11/16/21. S/p ex lap with suture ligation od duodenal ulcer on the posterior  wall on 11/20/21. Request for water soluble UGI to rule out leak. EXAM: DG UGI W SINGLE CM TECHNIQUE: Scout radiograph was obtained. Single contrast examination was performed using water soluble contrast. Total of 100 mL of Omnipaque 300 was injected through NGT. This exam was performed by Aimee Han PA-C, and was supervised and interpreted by Dr. Sabino Dick. FLUOROSCOPY TIME:  Radiation Exposure Index (as provided by the fluoroscopic device): 8.40 mGy If the device does not provide the exposure index: Fluoroscopy Time: 48 seconds COMPARISON:  November 20, 2021. FINDINGS: Scout Radiograph: Midline surgical staples, embolization coil, surgical clip in RLQ, and NGT noted. Esophagus: Not evaluated Esophageal motility:  Not evaluated Gastroesophageal reflux:  Not evaluated Ingested 57mm barium tablet:  Not given Stomach: Normal appearance Gastric emptying: Normal. Duodenum: Visualized portion of duodenum is unremarkable. Other:  No contrast extravasation is seen to suggest leakage. IMPRESSION: No contrast extravasation is seen from the stomach or duodenum to suggest leakage. Electronically Signed   By: Marijo Conception M.D.   On: 11/24/2021 13:13

## 2021-11-27 NOTE — Discharge Instructions (Signed)
Do NOT take ibuprofen/NSAIDs  [facility], [place] [phone]  OPEN ABDOMINAL SURGERY: POST OP INSTRUCTIONS  Always review your discharge instruction sheet given to you by the facility where your surgery was performed.  IF YOU HAVE DISABILITY OR FAMILY LEAVE FORMS, YOU MUST BRING THEM TO THE OFFICE FOR PROCESSING.  PLEASE DO NOT GIVE THEM TO YOUR DOCTOR.  A prescription for pain medication may be given to you upon discharge.  Take your pain medication as prescribed, if needed.  If narcotic pain medicine is not needed, then you may take acetaminophen (Tylenol). Take your usually prescribed medications unless otherwise directed. If you need a refill on your pain medication, please contact your pharmacy. They will contact our office to request authorization.  Prescriptions will not be filled after 5pm or on week-ends. You should follow a light diet the first few days after arrival home, such as soup and crackers, pudding, etc.unless your doctor has advised otherwise. A high-fiber, low fat diet can be resumed as tolerated.   Be sure to include lots of fluids daily. Most patients will experience some swelling and bruising on the chest and neck area.  Ice packs will help.  Swelling and bruising can take several days to resolve Most patients will experience some swelling and bruising in the area of the incision. Ice pack will help. Swelling and bruising can take several days to resolve..  It is common to experience some constipation if taking pain medication after surgery.  Increasing fluid intake and taking a stool softener will usually help or prevent this problem from occurring.  A mild laxative (Milk of Magnesia or Miralax) should be taken according to package directions if there are no bowel movements after 48 hours.  You may have steri-strips (small skin tapes) in place directly over the incision.  These strips should be left on the skin for 7-10 days.  If your surgeon used skin  glue on the incision, you may shower in 24 hours.  The glue will flake off over the next 2-3 weeks.  Any sutures or staples will be removed at the office during your follow-up visit. You may find that a light gauze bandage over your incision may keep your staples from being rubbed or pulled. You may shower and replace the bandage daily. ACTIVITIES:  You may resume regular (light) daily activities beginning the next day--such as daily self-care, walking, climbing stairs--gradually increasing activities as tolerated.  You may have sexual intercourse when it is comfortable.  Refrain from any heavy lifting or straining until approved by your doctor. You may drive when you no longer are taking prescription pain medication, you can comfortably wear a seatbelt, and you can safely maneuver your car and apply brakes Return to Work: ___________________________________ Dennis Bast should see your doctor in the office for a follow-up appointment approximately two weeks after your surgery.  Make sure that you call for this appointment within a day or two after you arrive home to insure a convenient appointment time. OTHER INSTRUCTIONS:  _____________________________________________________________ _____________________________________________________________  WHEN TO CALL YOUR DOCTOR: Fever over 101.0 Inability to urinate Nausea and/or vomiting Extreme swelling or bruising Continued bleeding from incision. Increased pain, redness, or drainage from the incision. Difficulty swallowing or breathing Muscle cramping or spasms. Numbness or tingling in hands or feet or around lips.  The clinic staff is available to answer your questions during regular business hours.  Please dont hesitate to call and ask to speak to one of  the nurses if you have concerns.  For further questions, please visit [email or web address]

## 2021-11-27 NOTE — Progress Notes (Signed)
[facility] Progress Note   Subjective: Seen in room. No new complaints. Feels weak.   Objective Vitals:   11/26/21 2323 11/27/21 0400 11/27/21 0406 11/27/21 0743  BP: 126/80 (!) 119/91  126/69  Pulse:  88  86  Resp: 16 15  17   Temp: 98 F (36.7 C) (!) 97.5 F (36.4 C)  97.8 F (36.6 C)  TempSrc: Oral Oral  Oral  SpO2:  96%  95%  Weight:   77.6 kg   Height:          Additional Objective Labs: Basic Metabolic Panel: Recent Labs  Lab 11/22/21 0845 11/24/21 0356 11/24/21 0819 11/25/21 0249 11/27/21 0353  NA 132* 128* 126* 128* 132*  K 4.9 4.1 3.7 3.3* 3.3*  CL 95* 91* 91* 94* 96*  CO2 23 23 22 24 26   GLUCOSE 70 51* 57* 107* 101*  BUN 53* 36* 37* 20 24*  CREATININE 5.26* 4.91* 5.04* 3.64* 4.80*  CALCIUM 7.4* 7.5* 7.5* 7.5* 7.4*  PHOS 7.2* 6.0* 6.0*  --   --     CBC: Recent Labs  Lab 11/20/21 1308 11/20/21 1639 11/21/21 0045 11/22/21 0845 11/23/21 0624 11/24/21 0356 11/25/21 0249 11/27/21 0353  WBC 15.7*   < > 15.8* 12.8* 10.9* 9.1 7.7 7.5  NEUTROABS 14.2*  --  13.6*  --  8.4*  --   --   --   HGB 9.1*   < > 9.4* 7.9* 7.6* 8.0* 8.9* 7.2*  HCT 26.1*   < > 26.5* 24.2* 23.3* 24.3* 26.9* 22.7*  MCV 82.1   < > 81.3 84.3 85.7 86.2 85.9 87.6  PLT 205   < > 248 284 278 363 425* 371   < > = values in this interval not displayed.    Blood Culture    Component Value Date/Time   SDES PERITONEAL FLUID 06/22/2021 1236   SPECREQUEST ABDOMEN 06/22/2021 1236   CULT  06/22/2021 1236    NO GROWTH 3 DAYS Performed at [facility], [place] [address], [place], [place] [address]    REPTSTATUS 06/26/2021 FINAL 06/22/2021 1236     Physical Exam General: Lying in bed, nontoxic appearing, nad  Heart: RRR No n,r,g  Lungs: Clear bilaterally  Abdomen: soft non-tender Extremities: No LE edema  Dialysis Access: LUE AVF +bruit   Medications:   sodium chloride   Intravenous Once   bisacodyl  10 mg Rectal Once   Chlorhexidine Gluconate Cloth  6 each  Topical Daily   darbepoetin (ARANESP) injection - DIALYSIS  150 mcg Intravenous Q Sat-HD   doxercalciferol  2 mcg Intravenous Q T,Th,Sa-HD   feeding supplement (NEPRO CARB STEADY)  237 mL Oral TID BM   multivitamin  1 tablet Oral QHS   pantoprazole  40 mg Oral BID   polyethylene glycol  17 g Oral Daily   sucralfate  1 g Oral TID WC & HS    OP HD:  [facility]  3h 69min  450/1.5  65kg  2/2 bath  P4   L AVF  Hep none -Hectorol 2 mcg IV TIW -Mircera 150 mcg IV q 2 weeks (last 1/28  > due 2/11) -Venofer 100 mg IV weekly   Assessment/Plan: Recurrent GI bleed: sp EGD 1/21 that showed active bleeding from duodenal ulcer that could not be controlled, so pt sent to IR and had embolization of the GDA.  He was dc'd on 1/25. He then rebled and was readmitted and had a mesenteric angiogram done by IR on  2/01 w/ embolization of the anterior and posterior pancreaticoduodenal arcades. Had further rebleeding here and went to OR 2/05 gen surg  w/ ex-lap, LOA and surgical ulcer repair.  Per GI/ surgery/ pmd. Now advancing diet.  ESRD: cont HD TTS. Next HD 2/14.  Anemia (ABLA + ESRD):  total 14U PRBCs + 2U FFP this admit. Surgical repair 2/05. Next esa due 2/11, have ordered darbe 150 q Sat 1st dose 2/11. Hgb 8.9 stable  MBD: Corr Ca ok.  Continue Renvela and VDRA. HTN/volume:  no vol excess on exam. BP's lower today. Net UF 2L  Nutrition: Albumin very low  - give protein supplements as tolerated. Advancing diet as tolerated.  Dispo: SNF or may go to sister's house in [place] PA-C [employer] 11/27/2021,10:58 AM

## 2021-11-28 LAB — CBC
HCT: 22.9 % — ABNORMAL LOW (ref 39.0–52.0)
Hemoglobin: 7.4 g/dL — ABNORMAL LOW (ref 13.0–17.0)
MCH: 28.6 pg (ref 26.0–34.0)
MCHC: 32.3 g/dL (ref 30.0–36.0)
MCV: 88.4 fL (ref 80.0–100.0)
Platelets: 365 10*3/uL (ref 150–400)
RBC: 2.59 MIL/uL — ABNORMAL LOW (ref 4.22–5.81)
RDW: 17.2 % — ABNORMAL HIGH (ref 11.5–15.5)
WBC: 9.7 10*3/uL (ref 4.0–10.5)
nRBC: 0 % (ref 0.0–0.2)

## 2021-11-28 NOTE — TOC Progression Note (Signed)
Transition of Care [facility]) - Progression Note    Patient Details  Name: Labron Bloodgood MRN: [id number] Date of Birth: [birth year]  Transition of Care [facility]) CM/SW [place], [place] Phone Number: 11/28/2021, 4:13 PM  Clinical Narrative:       Expected Discharge Plan: [place] Barriers to Discharge: SNF Pending bed offer  Expected Discharge Plan and Services Expected Discharge Plan: [place] arrangements for the past 2 months: Apartment                                       Social Determinants of Health (SDOH) Interventions    Readmission Risk Interventions Readmission Risk Prevention Plan 11/16/2021 11/09/2021  Transportation Screening - Complete  Medication Review (RN Care Manager) Referral to Pharmacy Referral to Pharmacy  PCP or Specialist appointment within 3-5 days of discharge Not Complete Complete  PCP/Specialist Appt Not Complete comments patient not medically ready to d/c -  [place] or Home Care Consult Not Complete Patient refused  [place] or Home Care Consult Pt Refusal Comments not complete, waiting on PT recs -  SW Recovery Care/Counseling Consult Complete Complete  Palliative Care Screening Not Applicable Not [place] Not Complete Not Applicable  SNF Comments Pending PT recommendations (CSW following) -  Some recent data might be hidden

## 2021-11-28 NOTE — Progress Notes (Signed)
Physical Therapy Treatment Patient Details Name: Timothy [name] MRN: [id number] DOB: April 17, 1961 Today's Date: 11/28/2021   History of Present Illness 61 y/o male presented to ED on 11/12/21 after being found on floor with rectal bleeding. Multiple recent admissions for CHF volume overload, encephalopathy, pulmonary edema, and GI bleed. CTA showed large pericardial effusion and ascites. EGD on 1/30 demonstrated duodenal ulcer with overlying adherent clot but no signs of active bleeding. Pt underwent exploratory laparotomy and repair of bleeding duodenal ulcer on 2/5.  PMH: ESRD (HD TTS), colon cancer, CHF, polysubstance abuse    PT Comments    Patient seen for second session of ambulation in the hallway.  Able to repeat improved transfers and safety with walker, though pt likely with some baseline lack of compliance given his history.  Feel he may continue to progress if able to practice stairs for home.  Currently continue with STSNF recommendations as safest plan since pt lives a lone in second floor apartment.  PT will continue to follow acutely.   Recommendations for follow up therapy are one component of a multi-disciplinary discharge planning process, led by the attending physician.  Recommendations may be updated based on patient status, additional functional criteria and insurance authorization.  Follow Up Recommendations  Skilled nursing-short term rehab (<3 hours/day)     Assistance Recommended at Discharge Intermittent Supervision/Assistance  Patient can return home with the following Help with stairs or ramp for entrance;A little help with walking and/or transfers;Assistance with cooking/housework;Assist for transportation   Equipment Recommendations       Recommendations for Other Services       Precautions / Restrictions Precautions Precautions: Fall Precaution Comments: JP Bulb to suction     Mobility  Bed Mobility Overal bed mobility: Needs Assistance Bed Mobility:  Sidelying to Sit, Sit to Supine   Sidelying to sit: Supervision   Sit to supine: Supervision   General bed mobility comments: up to EOB with assist for safety    Transfers Overall transfer level: Needs assistance Equipment used: Rolling walker (2 wheels) Transfers: Sit to/from Stand Sit to Stand: Min guard Stand pivot transfers: Supervision Step pivot transfers: Min guard       General transfer comment: assist for safety, cues for hand placement    Ambulation/Gait Ambulation/Gait assistance: Min guard Gait Distance (Feet): 150 Feet Assistive device: Rolling walker (2 wheels) Gait Pattern/deviations: Step-through pattern, Decreased stride length, Trunk flexed       General Gait Details: assist for balance/safety, good use of RW, but flexed through out despite cues for extension   Stairs             Wheelchair Mobility    Modified Rankin (Stroke Patients Only)       Balance Overall balance assessment: Needs assistance   Sitting balance-Leahy Scale: Good                                  Cognition Arousal/Alertness: Awake/alert Behavior During Therapy: WFL for tasks assessed/performed Overall Cognitive Status: Within Functional Limits for tasks assessed                                          Exercises      General Comments General comments (skin integrity, edema, etc.): VSS with ambulation, discussed stairs, but pt not ready yet to try  Pertinent Vitals/Pain Pain Assessment Pain Score: 9  Faces Pain Scale: Hurts a little bit Pain Location: feet, generalized Pain Descriptors / Indicators: Burning, Tingling Pain Intervention(s): Monitored during session, Repositioned, RN gave pain meds during session    Home Living                          Prior Function            PT Goals (current goals can now be found in the care plan section) Progress towards PT goals: Progressing toward goals     Frequency    Min 3X/week      PT Plan Current plan remains appropriate    Co-evaluation              AM-PAC PT "6 Clicks" Mobility   Outcome Measure  Help needed turning from your back to your side while in a flat bed without using bedrails?: A Little Help needed moving from lying on your back to sitting on the side of a flat bed without using bedrails?: A Little Help needed moving to and from a bed to a chair (including a wheelchair)?: A Little Help needed standing up from a chair using your arms (e.g., wheelchair or bedside chair)?: A Little Help needed to walk in hospital room?: A Little Help needed climbing 3-5 steps with a railing? : A Lot 6 Click Score: 17    End of Session   Activity Tolerance: Patient tolerated treatment well Patient left: in bed;with call bell/phone within reach   PT Visit Diagnosis: Other abnormalities of gait and mobility (R26.89);Muscle weakness (generalized) (M62.81)     Time: 7017-7939 PT Time Calculation (min) (ACUTE ONLY): 9 min  Charges:  $Gait Training: 8-22 mins                     Magda Kiel, PT Acute Rehabilitation Services QZESP:[phone] Office:[phone] 11/28/2021    Reginia Naas 11/28/2021, 2:51 PM

## 2021-11-28 NOTE — TOC Progression Note (Incomplete Revision)
Transition of Care [facility]) - Progression Note    Patient Details  Name: Timothy [name] MRN: [id number] Date of Birth: [birth year]  Transition of Care [facility]) CM/SW [place], [place] Phone Number: 11/28/2021, 4:13 PM  Clinical Narrative:       Expected Discharge Plan: [place] Barriers to Discharge: SNF Pending bed offer  Expected Discharge Plan and Services Expected Discharge Plan: [place] arrangements for the past 2 months: Apartment                                       Social Determinants of Health (SDOH) Interventions    Readmission Risk Interventions Readmission Risk Prevention Plan 11/16/2021 11/09/2021  Transportation Screening - Complete  Medication Review (RN Care Manager) Referral to Pharmacy Referral to Pharmacy  PCP or Specialist appointment within 3-5 days of discharge Not Complete Complete  PCP/Specialist Appt Not Complete comments patient not medically ready to d/c -  [place] or Home Care Consult Not Complete Patient refused  [place] or Home Care Consult Pt Refusal Comments not complete, waiting on PT recs -  SW Recovery Care/Counseling Consult Complete Complete  Palliative Care Screening Not Applicable Not [place] Not Complete Not Applicable  SNF Comments Pending PT recommendations (CSW following) -  Some recent data might be hidden

## 2021-11-28 NOTE — Progress Notes (Signed)
PROGRESS NOTE                                                                                                                                                                                                             Patient Demographics:    Timothy [name], is a 61 y.o. male, DOB - 1961/10/15, TIW:[id number]  Outpatient Primary MD for the patient is [name], MD    LOS - 63  Admit date - 11/12/2021    Chief Complaint  Patient presents with   Rectal Bleeding       Brief Narrative   61 year old gentleman with history of ESRD TTS schedule, GERD with duodenal ulcer, hypertension, chronic pain on oxycodone who was brought in by EMS after he was found to have large rectal bleeding with acute blood loss related anemia, he was found to have a hemoglobin of 5.1 in the ER and admitted to ICU.  He required multiple units of packed RBC transfusion, he was seen by GI underwent EGD on 11/16/2021 showing a duodenal ulcer with spurting blood and adherent clot, IR was consulted and he subsequently underwent embolization on 11/16/2021.  H&H stabilized somewhat and he was transferred to hospitalist service on 11/19/2021.   EXPLORATORY LAPAROTOMY, REPAIR OF BLEEDING DUODENAL ULCER 11/20/2021.  Diet has been advanced and GS has signed off.  Issue currently is placement.    Subjective:   Has 2 flights to get into his house    Assessment  & Plan :    Assessment and Plan:  Acute blood loss related anemia due to upper GI acute duodenal bleed  - he has received multiple units of packed RBCs in ICU, as well as getting 3 units of packed RBC between 11/19/2021 and 11/20/2018.  He is s/p EGD showing actively bleeding duodenal ulcer he is s/p embolization by IR on 11/16/2021.  Unfortunately night of 11/19/2021 he had a massive bloody bowel movement after which his H&H dropped again, requiring multiple units of packed RBC transfusion, general surgery saw the  patient and took him to the OR for emergent laparotomy with suturing of his duodenal ulcer on 11/20/2021.   -will monitor clinically, defer management of this issue to CCS and GI, transfuse as needed  -upper GI 2/9- no leaks -diet advanced to soft -surgery signed off  Hyponatremia -trending up   ESRD.  On TTS schedule.   -renal following  Paroxysmal A. fib RVR.   -new finding in the setting of severe anemia and acute illness.  He was briefly on diltiazem drip.  He is since converted to sinus rhythm in ICU  -Mali vas 2 score will be greater than 2.   - extremely poor candidate for anticoagulation due to #1 above.  - follow-up with cardiology outpatient within 1 to 2 weeks of discharge  HTN.   -Blood pressure is stabilizing we will place him on IV Lopressor and hydralazine due to n.p.o. status.  Past history of colon cancer.   -Follow with PCP and primary GI post discharge.  Chronic pain and remote history of substance abuse.   -Monitor with supportive care.   TOC consult for d/c planning- SNF vs sister's house in [place]  Code Status :  Full  Consults  :  Renal, GI, IR, PCCM, CCS  PUD Prophylaxis : PPI   Procedures  :     IR - Angiogram 11/16/21 - 1. No evidence of active extravasation about the duodenum, however multifocal vessel irregularity was observed. 2. Technically successful Gel-Foam slurry embolization of the anterior and posterior pancreaticoduodenal arcades.        Status is: Inpatient - needs safe d/c plan   DVT Prophylaxis  :    Place and maintain sequential compression device Start: 11/27/21 1033 Place TED hose Start: 11/19/21 0900 SCDs Start: 11/12/21 2005    Inpatient Medications  Scheduled Meds:  sodium chloride   Intravenous Once   Chlorhexidine Gluconate Cloth  6 each Topical Daily   darbepoetin (ARANESP) injection - DIALYSIS  150 mcg Intravenous Q Sat-HD   doxercalciferol  2 mcg Intravenous Q T,Th,Sa-HD   feeding  supplement (NEPRO CARB STEADY)  237 mL Oral TID BM   multivitamin  1 tablet Oral QHS   pantoprazole  40 mg Oral BID   polyethylene glycol  17 g Oral Daily   sucralfate  1 g Oral TID WC & HS   Continuous Infusions:   PRN Meds:.acetaminophen, bisacodyl, dextrose, hydrALAZINE, HYDROmorphone (DILAUDID) injection, ipratropium-albuterol, metoprolol tartrate, oxyCODONE  Antibiotics  :    Anti-infectives (From admission, onward)    Start     Dose/Rate Route Frequency Ordered Stop   11/20/21 0930  ceFAZolin (ANCEF) IVPB 2g/100 mL premix       See Hyperspace for full Linked Orders Report.   2 g 200 mL/hr over 30 Minutes Intravenous On call to O.R. 11/20/21 0831 11/20/21 1005   11/20/21 0930  metroNIDAZOLE (FLAGYL) IVPB 500 mg       See Hyperspace for full Linked Orders Report.   500 mg 100 mL/hr over 60 Minutes Intravenous On call to O.R. 11/20/21 0831 11/20/21 1010        Time Spent in minutes  [place] DO on 11/28/2021 at 11:53 AM      Objective:   Vitals:   11/28/21 0240 11/28/21 0417 11/28/21 0500 11/28/21 0735  BP:  117/81  132/74  Pulse:  87  85  Resp: (!) 21 19  13   Temp:  97.6 F (36.4 C)  98.1 F (36.7 C)  TempSrc:  Axillary  Oral  SpO2:  91%  97%  Weight:   75.1 kg   Height:        Wt Readings from Last 3 Encounters:  11/28/21 75.1 kg  11/08/21 71.5 kg  11/02/21 66.8 kg  Intake/Output Summary (Last 24 hours) at 11/28/2021 1153 Last data filed at 11/28/2021 0800 Gross per 24 hour  Intake 840 ml  Output --  Net 840 ml     Physical Exam     General: Appearance:    Chronically ill appearing male in no acute distress     Lungs:     respirations unlabored  Heart:    Normal heart rate.    MS:   All extremities are intact.    Neurologic:   Awake, alert       RN pressure injury documentation: Pressure Injury 11/12/21 Coccyx Right;Upper Stage 2 -  Partial thickness loss of dermis presenting as a shallow open injury with a red,  pink wound bed without slough. oval shaped opening with smooth edges and pink/red center (Active)  11/12/21 2100  Location: Coccyx  Location Orientation: Right;Upper  Staging: Stage 2 -  Partial thickness loss of dermis presenting as a shallow open injury with a red, pink wound bed without slough.  Wound Description (Comments): oval shaped opening with smooth edges and pink/red center  Present on Admission: Yes     Data Review:    CBC Recent Labs  Lab 11/23/21 0624 11/24/21 0356 11/25/21 0249 11/27/21 0353 11/28/21 0442  WBC 10.9* 9.1 7.7 7.5 9.7  HGB 7.6* 8.0* 8.9* 7.2* 7.4*  HCT 23.3* 24.3* 26.9* 22.7* 22.9*  PLT 278 363 425* 371 365  MCV 85.7 86.2 85.9 87.6 88.4  MCH 27.9 28.4 28.4 27.8 28.6  MCHC 32.6 32.9 33.1 31.7 32.3  RDW 18.3* 18.0* 17.5* 17.2* 17.2*  LYMPHSABS 0.6*  --   --   --   --   MONOABS 1.6*  --   --   --   --   EOSABS 0.1  --   --   --   --   BASOSABS 0.0  --   --   --   --     Electrolytes Recent Labs  Lab 11/22/21 0845 11/24/21 0356 11/24/21 0819 11/25/21 0249 11/27/21 0353  NA 132* 128* 126* 128* 132*  K 4.9 4.1 3.7 3.3* 3.3*  CL 95* 91* 91* 94* 96*  CO2 23 23 22 24 26   GLUCOSE 70 51* 57* 107* 101*  BUN 53* 36* 37* 20 24*  CREATININE 5.26* 4.91* 5.04* 3.64* 4.80*  CALCIUM 7.4* 7.5* 7.5* 7.5* 7.4*  ALBUMIN  --  1.6* 1.5*  --   --      ------------------------------------------------------------------------------------------------------------------ ID Labs Recent Labs  Lab 11/22/21 0845 11/23/21 5093 11/24/21 0356 11/24/21 0819 11/25/21 0249 11/27/21 0353 11/28/21 0442  WBC 12.8* 10.9* 9.1  --  7.7 7.5 9.7  PLT 284 278 363  --  425* 371 365  CREATININE 5.26*  --  4.91* 5.04* 3.64* 4.80*  --     Radiology Reports DG UGI W SINGLE CM (SOL OR THIN BA)  Result Date: 11/24/2021 CLINICAL DATA:  61 year old male with recurrent UGI bleed s/p coil embolization with IR 11/05/21 and 11/16/21. S/p ex lap with suture ligation od duodenal ulcer  on the posterior wall on 11/20/21. Request for water soluble UGI to rule out leak. EXAM: DG UGI W SINGLE CM TECHNIQUE: Scout radiograph was obtained. Single contrast examination was performed using water soluble contrast. Total of 100 mL of Omnipaque 300 was injected through NGT. This exam was performed by Aimee Han PA-C, and was supervised and interpreted by Dr. Sabino Dick. FLUOROSCOPY TIME:  Radiation Exposure Index (as provided by the fluoroscopic device):  8.40 mGy If the device does not provide the exposure index: Fluoroscopy Time: 48 seconds COMPARISON:  November 20, 2021. FINDINGS: Scout Radiograph: Midline surgical staples, embolization coil, surgical clip in RLQ, and NGT noted. Esophagus: Not evaluated Esophageal motility:  Not evaluated Gastroesophageal reflux:  Not evaluated Ingested 57mm barium tablet:  Not given Stomach: Normal appearance Gastric emptying: Normal. Duodenum: Visualized portion of duodenum is unremarkable. Other:  No contrast extravasation is seen to suggest leakage. IMPRESSION: No contrast extravasation is seen from the stomach or duodenum to suggest leakage. Electronically Signed   By: Marijo Conception M.D.   On: 11/24/2021 13:13

## 2021-11-28 NOTE — Progress Notes (Signed)
[facility] Progress Note   Subjective: Seen in room. No new complaints/acute events overnight  Objective Vitals:   11/28/21 0240 11/28/21 0417 11/28/21 0500 11/28/21 0735  BP:  117/81  132/74  Pulse:  87  85  Resp: (!) 21 19  13   Temp:  97.6 F (36.4 C)  98.1 F (36.7 C)  TempSrc:  Axillary  Oral  SpO2:  91%  97%  Weight:   75.1 kg   Height:          Additional Objective Labs: Basic Metabolic Panel: Recent Labs  Lab 11/22/21 0845 11/24/21 0356 11/24/21 0819 11/25/21 0249 11/27/21 0353  NA 132* 128* 126* 128* 132*  K 4.9 4.1 3.7 3.3* 3.3*  CL 95* 91* 91* 94* 96*  CO2 23 23 22 24 26   GLUCOSE 70 51* 57* 107* 101*  BUN 53* 36* 37* 20 24*  CREATININE 5.26* 4.91* 5.04* 3.64* 4.80*  CALCIUM 7.4* 7.5* 7.5* 7.5* 7.4*  PHOS 7.2* 6.0* 6.0*  --   --    CBC: Recent Labs  Lab 11/23/21 0624 11/24/21 0356 11/25/21 0249 11/27/21 0353 11/28/21 0442  WBC 10.9* 9.1 7.7 7.5 9.7  NEUTROABS 8.4*  --   --   --   --   HGB 7.6* 8.0* 8.9* 7.2* 7.4*  HCT 23.3* 24.3* 26.9* 22.7* 22.9*  MCV 85.7 86.2 85.9 87.6 88.4  PLT 278 363 425* 371 365   Blood Culture    Component Value Date/Time   SDES PERITONEAL FLUID 06/22/2021 1236   SPECREQUEST ABDOMEN 06/22/2021 1236   CULT  06/22/2021 1236    NO GROWTH 3 DAYS Performed at [facility], [place] [address], [place], [place] [address]    REPTSTATUS 06/26/2021 FINAL 06/22/2021 1236     Physical Exam General: Lying in bed, nontoxic appearing, nad  Heart: RRR No n,r,g  Lungs: Clear bilaterally  Abdomen: soft non-tender Extremities: No LE edema  Dialysis Access: LUE AVF +b/t  Medications:   sodium chloride   Intravenous Once   Chlorhexidine Gluconate Cloth  6 each Topical Daily   darbepoetin (ARANESP) injection - DIALYSIS  150 mcg Intravenous Q Sat-HD   doxercalciferol  2 mcg Intravenous Q T,Th,Sa-HD   feeding supplement (NEPRO CARB STEADY)  237 mL Oral TID BM   multivitamin  1 tablet Oral QHS    pantoprazole  40 mg Oral BID   polyethylene glycol  17 g Oral Daily   sucralfate  1 g Oral TID WC & HS    OP HD:  [place] [facility]  3h 42min  450/1.5  65kg  2/2 bath  P4   L AVF  Hep none -Hectorol 2 mcg IV TIW -Mircera 150 mcg IV q 2 weeks (last 1/28  > due 2/11) -Venofer 100 mg IV weekly   Assessment/Plan: Recurrent GI bleed: sp EGD 1/21 that showed active bleeding from duodenal ulcer that could not be controlled, so pt sent to IR and had embolization of the GDA.  He was dc'd on 1/25. 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 Had further rebleeding here and went to OR 2/05 gen surg  w/ ex-lap, LOA and surgical ulcer repair.  Per GI/ surgery/ pmd. Now advancing diet.  ESRD: cont HD TTS. Next HD 2/14.  Anemia (ABLA + ESRD):  total 14U PRBCs + 2U FFP this admit. Surgical repair 2/05. Next esa due 2/11, have ordered  darbe 150 q Sat 1st dose 2/11. Hgb 7.4 stable as compared to yesterday MBD: Corr Ca ok.  Continue Renvela and VDRA. HTN/volume:  no vol excess on exam. UF as tolerated Nutrition: Albumin very low  - give protein supplements as tolerated. Advancing diet as tolerated.  Dispo: SNF? Per primary    Gean Quint, MD [place] 11/28/2021,10:12 AM

## 2021-11-28 NOTE — TOC Progression Note (Signed)
Transition of Care [facility]) - Progression Note    Patient Details  Name: Timothy [name] MRN: [id number] Date of Birth: [birth year]  Transition of Care [facility]) CM/SW [place], [place] Phone Number: 11/28/2021, 10:53 AM  Clinical Narrative:     Patient has no bed offers  TOC will continue to follow and assist with discharge planning.  Thurmond Butts, MSW, LCSW Clinical Social Worker    Expected Discharge Plan: Skilled Nursing Facility Barriers to Discharge: SNF Pending bed offer  Expected Discharge Plan and Services Expected Discharge Plan: [place] arrangements for the past 2 months: Apartment                                       Social Determinants of Health (SDOH) Interventions    Readmission Risk Interventions Readmission Risk Prevention Plan 11/16/2021 11/09/2021  Transportation Screening - Complete  Medication Review Press photographer) Referral to Pharmacy Referral to Pharmacy  PCP or Specialist appointment within 3-5 days of discharge Not Complete Complete  PCP/Specialist Appt Not Complete comments patient not medically ready to d/c -  [place] or Home Care Consult Not Complete Patient refused  [place] or Home Care Consult Pt Refusal Comments not complete, waiting on PT recs -  SW Recovery Care/Counseling Consult Complete Complete  Palliative Care Screening Not Applicable Not [place] Not Complete Not Applicable  SNF Comments Pending PT recommendations (CSW following) -  Some recent data might be hidden

## 2021-11-28 NOTE — Progress Notes (Signed)
Occupational Therapy Treatment Patient Details Name: Timothy [name] MRN: [id number] DOB: 07-05-1961 Today's Date: 11/28/2021   History of present illness 61 y/o male presented to ED on 11/12/21 after being found on floor with rectal bleeding. Multiple recent admissions for CHF volume overload, encephalopathy, pulmonary edema, and GI bleed. CTA showed large pericardial effusion and ascites. EGD on 1/30 demonstrated duodenal ulcer with overlying adherent clot but no signs of active bleeding. Pt underwent exploratory laparotomy and repair of bleeding duodenal ulcer on 2/5.  PMH: ESRD (HD TTS), colon cancer, CHF, polysubstance abuse   OT comments  Pt making good progress with adls and adl transfers. Pt continues to resist therapy but did agree to get up and move around the room stating he feels very weak.  Pt lives on second floor and due to feeling week may benefit from short SNF stay prior to returning home so he can be completely independent at d/c.  Feel pt's activity tolerance is limited and is a fall risk living on second floor. Will continue to see with focus on activity tolerance.     Recommendations for follow up therapy are one component of a multi-disciplinary discharge planning process, led by the attending physician.  Recommendations may be updated based on patient status, additional functional criteria and insurance authorization.    Follow Up Recommendations  Skilled nursing-short term rehab (<3 hours/day)    Assistance Recommended at Discharge Set up Supervision/Assistance  Patient can return home with the following  Assistance with cooking/housework;Assist for transportation;Help with stairs or ramp for entrance   Equipment Recommendations       Recommendations for Other Services      Precautions / Restrictions Precautions Precautions: Fall Restrictions Weight Bearing Restrictions: No       Mobility Bed Mobility Overal bed mobility: Independent              General bed mobility comments: Pt was able to get to EOB without equipment today and with  no physical assist.    Transfers Overall transfer level: Needs assistance Equipment used: Rolling walker (2 wheels) Transfers: Sit to/from Stand Sit to Stand: Supervision Stand pivot transfers: Supervision         General transfer comment: Pt did not require physical assist to stand from bed or toilet.     Balance Overall balance assessment: Needs assistance Sitting-balance support: No upper extremity supported, Feet supported Sitting balance-Leahy Scale: Good     Standing balance support: During functional activity, No upper extremity supported Standing balance-Leahy Scale: Fair Standing balance comment: groomed at sink with supervision                           ADL either performed or assessed with clinical judgement   ADL Overall ADL's : Needs assistance/impaired Eating/Feeding: Independent;Sitting   Grooming: Wash/dry hands;Wash/dry face;Oral care;Supervision/safety;Standing Grooming Details (indicate cue type and reason): Pt walked to bathroom and groomed at sink with supervision. Pt fatigues quickly and braces elbows on countertop due to fatigue.             Lower Body Dressing: Supervision/safety;Sit to/from stand Lower Body Dressing Details (indicate cue type and reason): Pt donned socks without difficulty. Toilet Transfer: Supervision/safety;Ambulation;Comfort height toilet;Grab bars;Rolling walker (2 wheels) Toilet Transfer Details (indicate cue type and reason): Pt walked to bathroom with walker. Therapist assisted with lines only. Pt did not want therapist touching him. Cues given to use rail. Toileting- Clothing Manipulation and Hygiene: Supervision/safety;Sit to/from stand  Functional mobility during ADLs: Supervision/safety;Rolling walker (2 wheels);Min guard General ADL Comments: Pt stronger today able to do most tasks with supervision to mod I.  Pt fatigues quickly.    Extremity/Trunk Assessment Upper Extremity Assessment Upper Extremity Assessment: Overall WFL for tasks assessed   Lower Extremity Assessment Lower Extremity Assessment: Defer to PT evaluation        Vision   Vision Assessment?: No apparent visual deficits   Perception Perception Perception: Within Functional Limits   Praxis Praxis Praxis: Intact    Cognition Arousal/Alertness: Awake/alert Behavior During Therapy: Flat affect Overall Cognitive Status: Within Functional Limits for tasks assessed                                 General Comments: Pt not very agreeable to much but did participate today and recongnizes he has weakness that will not allow him to be as independent as prior to admission.        Exercises      Shoulder Instructions       General Comments Pt more steady on feet today during adls although remains fatigued with activity.    Pertinent Vitals/ Pain       Pain Assessment Pain Assessment: Faces Faces Pain Scale: Hurts a little bit Pain Location: generalized, more weakness than pain Pain Descriptors / Indicators: Tiring Pain Intervention(s): Monitored during session, Repositioned  Home Living                                          Prior Functioning/Environment              Frequency  Min 2X/week        Progress Toward Goals  OT Goals(current goals can now be found in the care plan section)  Progress towards OT goals: Progressing toward goals  Acute Rehab OT Goals Patient Stated Goal: to get stronger OT Goal Formulation: With patient Time For Goal Achievement: 12/05/21 Potential to Achieve Goals: Good ADL Goals Pt Will Transfer to Toilet: with modified independence;ambulating Additional ADL Goal #1: Pt to increase standing activity tolerance > 7 min during functional tasks with stable vitals and no more than 1 rest break Additional ADL Goal #2: Pt to demo ability to  gather ADL/IADL items MOD I without LOB  Plan Discharge plan needs to be updated    Co-evaluation                 AM-PAC OT "6 Clicks" Daily Activity     Outcome Measure   Help from another person eating meals?: None Help from another person taking care of personal grooming?: None Help from another person toileting, which includes using toliet, bedpan, or urinal?: None Help from another person bathing (including washing, rinsing, drying)?: A Little Help from another person to put on and taking off regular upper body clothing?: None Help from another person to put on and taking off regular lower body clothing?: A Little 6 Click Score: 22    End of Session Equipment Utilized During Treatment: Rolling walker (2 wheels)  OT Visit Diagnosis: Other abnormalities of gait and mobility (R26.89);Pain   Activity Tolerance Patient limited by fatigue   Patient Left Other (comment) (w PT to walk in hallway)   Nurse Communication Mobility status        Time: 6213-0865 OT Time  Calculation (min): 20 min  Charges: OT General Charges $OT Visit: 1 Visit OT Treatments $Self Care/Home Management : 8-22 mins   Glenford Peers 11/28/2021, 9:37 AM

## 2021-11-28 NOTE — TOC Progression Note (Addendum)
Transition of Care [facility]) - Progression Note    Patient Details  Name: Timothy [name] MRN: [id number] Date of Birth: July 13, 1961  Transition of Care [facility]) CM/SW [place], [place] Phone Number: 11/28/2021, 4:29 PM  Clinical Narrative:     CSW met with patient at bedside. CSW introduced self and explained role. CSW discussed therapy recommendation of short term rehab at [facility]. Patient states he no longer intends to discharge to his sister's house in [place]. He states he medical doctors and follow appointments are here [facility] ). Patient states he is married but separated-his daughter lives in the home, and is there during the day but works in the evenings. CSW informed he has no bed offers and encouraged him to work with PT on stairs/ and getting stronger because, it is possible he will not get any SNF offers. Patient states understanding.   Patient has dialysis T,TH SAT at [facility] on [address] transportation and reports his chair time is 5:40 am.   [facility] will continue SNF search but ist possible patient will have to d/c home. CSW sent secure chat to PT/Braxley Balandran to update. TOC appreciated PT efforts to work with patient while here in the hospital. TOC will continue to follow and assist with discharge planning.   Thurmond Butts, MSW, LCSW Clinical Social Worker    Expected Discharge Plan: Skilled Nursing Facility Barriers to Discharge: SNF Pending bed offer, Awaiting State Approval (PASRR) (polysubsstance abuse)  Expected Discharge Plan and Services Expected Discharge Plan: [place] In-house Referral: Clinical Social Work     Living arrangements for the past 2 months: Apartment                                       Social Determinants of Health (SDOH) Interventions    Readmission Risk Interventions Readmission Risk Prevention Plan 11/16/2021 11/09/2021  Transportation Screening - Complete  Medication  Review Press photographer) Referral to Pharmacy Referral to Pharmacy  PCP or Specialist appointment within 3-5 days of discharge Not Complete Complete  PCP/Specialist Appt Not Complete comments patient not medically ready to d/c -  [place] or Home Care Consult Not Complete Patient refused  [place] or Home Care Consult Pt Refusal Comments not complete, waiting on PT recs -  SW Recovery Care/Counseling Consult Complete Complete  Palliative Care Screening Not Applicable Not [employer] Not Complete Not Applicable  SNF Comments Pending PT recommendations (CSW following) -  Some recent data might be hidden

## 2021-11-28 NOTE — Progress Notes (Signed)
Physical Therapy Treatment Patient Details Name: Timothy [name] MRN: [id number] DOB: Feb 01, 1961 Today's Date: 11/28/2021   History of Present Illness 61 y/o male presented to ED on 11/12/21 after being found on floor with rectal bleeding. Multiple recent admissions for CHF volume overload, encephalopathy, pulmonary edema, and GI bleed. CTA showed large pericardial effusion and ascites. EGD on 1/30 demonstrated duodenal ulcer with overlying adherent clot but no signs of active bleeding. Pt underwent exploratory laparotomy and repair of bleeding duodenal ulcer on 2/5.  PMH: ESRD (HD TTS), colon cancer, CHF, polysubstance abuse    PT Comments    Patient progressing with ambulation and improved LE strength without episodes of knee buckling today and per OT improved with sit to stand.  Patient agreeable to try and walk again later.  Will try and return for second walk and see if able to attempt stair training.    Recommendations for follow up therapy are one component of a multi-disciplinary discharge planning process, led by the attending physician.  Recommendations may be updated based on patient status, additional functional criteria and insurance authorization.  Follow Up Recommendations  Skilled nursing-short term rehab (<3 hours/day)     Assistance Recommended at Discharge Intermittent Supervision/Assistance  Patient can return home with the following Help with stairs or ramp for entrance;A little help with walking and/or transfers;Assistance with cooking/housework;Assist for transportation   Equipment Recommendations  Rollator (4 wheels)    Recommendations for Other Services       Precautions / Restrictions Precautions Precautions: Fall Precaution Comments: JP Bulb to suction Restrictions Weight Bearing Restrictions: No     Mobility  Bed Mobility Overal bed mobility: Independent Bed Mobility: Sit to Supine       Sit to supine: Supervision        Transfers Overall  transfer level: Needs assistance Equipment used: Rolling walker (2 wheels) Transfers: Sit to/from Stand Sit to Stand: Supervision Stand pivot transfers: Supervision Step pivot transfers: Min guard            Ambulation/Gait Ambulation/Gait assistance: Min guard Gait Distance (Feet): 150 Feet Assistive device: Rolling walker (2 wheels) Gait Pattern/deviations: Step-through pattern, Decreased stride length, Trunk flexed       General Gait Details: Patient standing in bathroom following OT for ADL session, ambulated in hallway with RW and minguard for safety with flexed posture and reliant on UE support. no LOB or SOB noted.   Stairs             Wheelchair Mobility    Modified Rankin (Stroke Patients Only)       Balance Overall balance assessment: Needs assistance   Sitting balance-Leahy Scale: Good       Standing balance-Leahy Scale: Fair Standing balance comment: with OT at sink for ADL without UE support                            Cognition Arousal/Alertness: Awake/alert Behavior During Therapy: Flat affect Overall Cognitive Status: Within Functional Limits for tasks assessed                                          Exercises      General Comments General comments (skin integrity, edema, etc.): Pt more steady on feet today during adls although remains fatigued with activity.      Pertinent Vitals/Pain Pain Assessment Faces Pain Scale:  Hurts a little bit Pain Location: generalized, more weakness than pain Pain Descriptors / Indicators: Tiring Pain Intervention(s): Monitored during session, Patient requesting pain meds-RN notified, Repositioned    Home Living                          Prior Function            PT Goals (current goals can now be found in the care plan section) Progress towards PT goals: Progressing toward goals    Frequency    Min 3X/week      PT Plan Current plan remains  appropriate    Co-evaluation              AM-PAC PT "6 Clicks" Mobility   Outcome Measure  Help needed turning from your back to your side while in a flat bed without using bedrails?: A Little Help needed moving from lying on your back to sitting on the side of a flat bed without using bedrails?: A Little Help needed moving to and from a bed to a chair (including a wheelchair)?: A Little Help needed standing up from a chair using your arms (e.g., wheelchair or bedside chair)?: A Little Help needed to walk in hospital room?: A Little Help needed climbing 3-5 steps with a railing? : Total 6 Click Score: 16    End of Session Equipment Utilized During Treatment: Gait belt Activity Tolerance: Patient limited by fatigue Patient left: in chair;with call bell/phone within reach;with chair alarm set Nurse Communication: Other (comment) (OOB currently ok for bed linen change) PT Visit Diagnosis: Other abnormalities of gait and mobility (R26.89);Muscle weakness (generalized) (M62.81)     Time: 3736-6815 PT Time Calculation (min) (ACUTE ONLY): 10 min  Charges:  $Gait Training: 8-22 mins                     Magda Kiel, PT Acute Rehabilitation Services TELMR:[phone] Office:[phone] 11/28/2021    Reginia Naas 11/28/2021, 10:46 AM

## 2021-11-29 LAB — VITAMIN B12: Vitamin B-12: 1055 pg/mL — ABNORMAL HIGH (ref 180–914)

## 2021-11-29 NOTE — Progress Notes (Signed)
PROGRESS NOTE                                                                                                                                                                                                             Patient Demographics:    Timothy [name], is a 61 y.o. male, DOB - Mar 24, 1961, LFY:[id number]  Outpatient Primary MD for the patient is [name], MD    LOS - 23  Admit date - 11/12/2021    Chief Complaint  Patient presents with   Rectal Bleeding       Brief Narrative   61 year old gentleman with history of ESRD TTS schedule, GERD with duodenal ulcer, hypertension, chronic pain on oxycodone who was brought in by EMS after he was found to have large rectal bleeding with acute blood loss related anemia, he was found to have a hemoglobin of 5.1 in the ER and admitted to ICU.  He required multiple units of packed RBC transfusion, he was seen by GI underwent EGD on 11/16/2021 showing a duodenal ulcer with spurting blood and adherent clot, IR was consulted and he subsequently underwent embolization on 11/16/2021.  H&H stabilized somewhat and he was transferred to hospitalist service on 11/19/2021.   EXPLORATORY LAPAROTOMY, REPAIR OF BLEEDING DUODENAL ULCER 11/20/2021.  Diet has been advanced and GS has signed off.  Issue currently is placement.    Subjective:   on HD    Assessment  & Plan :    Assessment and Plan:  Acute blood loss related anemia due to upper GI acute duodenal bleed  - he has received multiple units of packed RBCs in ICU, as well as getting 3 units of packed RBC between 11/19/2021 and 11/20/2018.  He is s/p EGD showing actively bleeding duodenal ulcer he is s/p embolization by IR on 11/16/2021.  Unfortunately night of 11/19/2021 he had a massive bloody bowel movement after which his H&H dropped again, requiring multiple units of packed RBC transfusion, general surgery saw the patient and took him to the OR  for emergent laparotomy with suturing of his duodenal ulcer on 11/20/2021.   -will monitor clinically, defer management of this issue to CCS and GI, transfuse as needed  -upper GI 2/9- no leaks -diet advanced to soft -surgery signed off  Hyponatremia -trending up   ESRD.  On TTS schedule.   -  renal following  Paroxysmal A. fib RVR.   -new finding in the setting of severe anemia and acute illness.  He was briefly on diltiazem drip.  He is since converted to sinus rhythm in ICU  -Mali vas 2 score will be greater than 2.   - extremely poor candidate for anticoagulation due to #1 above.  - follow-up with cardiology outpatient within 1 to 2 weeks of discharge  HTN.   -Blood pressure is stabilizing we will place him on IV Lopressor and hydralazine due to n.p.o. status.  Past history of colon cancer.   -Follow with PCP and primary GI post discharge.  Chronic pain and remote history of substance abuse.   -Monitor with supportive care.   TOC consult for d/c planning- SNF vs home (has 2 flights of stairs at home)       Condition -  Guarded  Code Status :  Full  Consults  :  Renal, GI, IR, PCCM, CCS  PUD Prophylaxis : PPI   Procedures  :     IR - Angiogram 11/16/21 - 1. No evidence of active extravasation about the duodenum, however multifocal vessel irregularity was observed. 2. Technically successful Gel-Foam slurry embolization of the anterior and posterior pancreaticoduodenal arcades.        Status is: Inpatient - needs safe d/c plan-SNF (no bed yet)   DVT Prophylaxis  :    Place and maintain sequential compression device Start: 11/27/21 1033 Place TED hose Start: 11/19/21 0900 SCDs Start: 11/12/21 2005    Inpatient Medications  Scheduled Meds:  sodium chloride   Intravenous Once   Chlorhexidine Gluconate Cloth  6 each Topical Daily   darbepoetin (ARANESP) injection - DIALYSIS  150 mcg Intravenous Q Sat-HD   doxercalciferol  2 mcg Intravenous Q T,Th,Sa-HD    feeding supplement (NEPRO CARB STEADY)  237 mL Oral TID BM   multivitamin  1 tablet Oral QHS   pantoprazole  40 mg Oral BID   polyethylene glycol  17 g Oral Daily   sucralfate  1 g Oral TID WC & HS   Continuous Infusions:   PRN Meds:.acetaminophen, bisacodyl, dextrose, hydrALAZINE, HYDROmorphone (DILAUDID) injection, ipratropium-albuterol, metoprolol tartrate, oxyCODONE  Antibiotics  :    Anti-infectives (From admission, onward)    Start     Dose/Rate Route Frequency Ordered Stop   11/20/21 0930  ceFAZolin (ANCEF) IVPB 2g/100 mL premix       See Hyperspace for full Linked Orders Report.   2 g 200 mL/hr over 30 Minutes Intravenous On call to O.R. 11/20/21 0831 11/20/21 1005   11/20/21 0930  metroNIDAZOLE (FLAGYL) IVPB 500 mg       See Hyperspace for full Linked Orders Report.   500 mg 100 mL/hr over 60 Minutes Intravenous On call to O.R. 11/20/21 0831 11/20/21 1010        Time Spent in minutes  [place] DO on 11/29/2021 at 1:45 PM      Objective:   Vitals:   11/29/21 1130 11/29/21 1200 11/29/21 1240 11/29/21 1245  BP: (!) 147/90 (!) 144/83 (!) 141/83 131/78  Pulse: 90 90 92 91  Resp: 13 18 14 14   Temp:      TempSrc:      SpO2:   96% 96%  Weight:      Height:        Wt Readings from Last 3 Encounters:  11/29/21 82.7 kg  11/08/21 71.5 kg  11/02/21 66.8 kg  Intake/Output Summary (Last 24 hours) at 11/29/2021 1345 Last data filed at 11/29/2021 1240 Gross per 24 hour  Intake 240 ml  Output 2000 ml  Net -1760 ml     Physical Exam    General: Appearance:    Chronically ill appearing male in no acute distress on HD     Lungs:     respirations unlabored  Heart:    Normal heart rate.       Data Review:    CBC Recent Labs  Lab 11/23/21 0624 11/24/21 0356 11/25/21 0249 11/27/21 0353 11/28/21 0442  WBC 10.9* 9.1 7.7 7.5 9.7  HGB 7.6* 8.0* 8.9* 7.2* 7.4*  HCT 23.3* 24.3* 26.9* 22.7* 22.9*  PLT 278 363 425* 371 365  MCV 85.7 86.2  85.9 87.6 88.4  MCH 27.9 28.4 28.4 27.8 28.6  MCHC 32.6 32.9 33.1 31.7 32.3  RDW 18.3* 18.0* 17.5* 17.2* 17.2*  LYMPHSABS 0.6*  --   --   --   --   MONOABS 1.6*  --   --   --   --   EOSABS 0.1  --   --   --   --   BASOSABS 0.0  --   --   --   --     Electrolytes Recent Labs  Lab 11/24/21 0356 11/24/21 0819 11/25/21 0249 11/27/21 0353  NA 128* 126* 128* 132*  K 4.1 3.7 3.3* 3.3*  CL 91* 91* 94* 96*  CO2 23 22 24 26   GLUCOSE 51* 57* 107* 101*  BUN 36* 37* 20 24*  CREATININE 4.91* 5.04* 3.64* 4.80*  CALCIUM 7.5* 7.5* 7.5* 7.4*  ALBUMIN 1.6* 1.5*  --   --      ------------------------------------------------------------------------------------------------------------------ ID Labs Recent Labs  Lab 11/23/21 0624 11/24/21 0356 11/24/21 0819 11/25/21 0249 11/27/21 0353 11/28/21 0442  WBC 10.9* 9.1  --  7.7 7.5 9.7  PLT 278 363  --  425* 371 365  CREATININE  --  4.91* 5.04* 3.64* 4.80*  --     Radiology Reports No results found.

## 2021-11-29 NOTE — Progress Notes (Signed)
Mobility Specialist: Progress Note   11/29/21 1708  Mobility  Activity Ambulated with assistance in room  Level of Assistance Modified independent, requires aide device or extra time  Assistive Device Front wheel walker  Distance Ambulated (ft) 32 ft  Activity Response Tolerated well  $Mobility charge 1 Mobility   Pt received sitting EOB, reluctant but agreeable to mobility session. Ambulated in the room to the door and back with c/o R foot pain, no rating given. Pt sitting EOB after session with call bell and phone in reach.  [facility] Yasmeen Manka Mobility Specialist Mobility Specialist 5 [address]: [phone] Mobility Specialist 6 [place]: [phone]

## 2021-11-29 NOTE — Progress Notes (Signed)
[facility] Progress Note   Subjective: Seen in room. No new complaints/acute events overnight. Open for hd later today  Objective Vitals:   11/28/21 2335 11/29/21 0348 11/29/21 0356 11/29/21 0731  BP: (!) 141/80 136/78  (!) 129/94  Pulse: 89 86  82  Resp: 13 17  15   Temp: 98.4 F (36.9 C) 98.3 F (36.8 C)  98.3 F (36.8 C)  TempSrc: Oral Oral  Oral  SpO2: 96% 96%  97%  Weight:   80.3 kg   Height:          Additional Objective Labs: Basic Metabolic Panel: Recent Labs  Lab 11/24/21 0356 11/24/21 0819 11/25/21 0249 11/27/21 0353  NA 128* 126* 128* 132*  K 4.1 3.7 3.3* 3.3*  CL 91* 91* 94* 96*  CO2 23 22 24 26   GLUCOSE 51* 57* 107* 101*  BUN 36* 37* 20 24*  CREATININE 4.91* 5.04* 3.64* 4.80*  CALCIUM 7.5* 7.5* 7.5* 7.4*  PHOS 6.0* 6.0*  --   --    CBC: Recent Labs  Lab 11/23/21 0624 11/24/21 0356 11/25/21 0249 11/27/21 0353 11/28/21 0442  WBC 10.9* 9.1 7.7 7.5 9.7  NEUTROABS 8.4*  --   --   --   --   HGB 7.6* 8.0* 8.9* 7.2* 7.4*  HCT 23.3* 24.3* 26.9* 22.7* 22.9*  MCV 85.7 86.2 85.9 87.6 88.4  PLT 278 363 425* 371 365   Blood Culture    Component Value Date/Time   SDES PERITONEAL FLUID 06/22/2021 1236   SPECREQUEST ABDOMEN 06/22/2021 1236   CULT  06/22/2021 1236    NO GROWTH 3 DAYS Performed at [facility], [place] [address] [place], [place] [address]    REPTSTATUS 06/26/2021 FINAL 06/22/2021 1236     Physical Exam General: Lying in bed, nontoxic appearing, nad  Heart: RRR No n,r,g  Lungs: Clear bilaterally  Abdomen: soft non-tender Extremities: No LE edema  Dialysis Access: LUE AVF +b/t  Medications:   sodium chloride   Intravenous Once   Chlorhexidine Gluconate Cloth  6 each Topical Daily   darbepoetin (ARANESP) injection - DIALYSIS  150 mcg Intravenous Q Sat-HD   doxercalciferol  2 mcg Intravenous Q T,Th,Sa-HD   feeding supplement (NEPRO CARB STEADY)  237 mL Oral TID BM   multivitamin  1 tablet Oral QHS    pantoprazole  40 mg Oral BID   polyethylene glycol  17 g Oral Daily   sucralfate  1 g Oral TID WC & HS    [facility] HD:  [facility]  3h 24min  450/1.5  65kg  2/2 bath  P4   L AVF  Hep none -Hectorol 2 mcg IV TIW -Mircera 150 mcg IV q 2 weeks (last 1/28  > due 2/11) -Venofer 100 mg IV weekly   Assessment/Plan: Recurrent GI bleed: sp EGD 1/21 that showed active bleeding from duodenal ulcer that could not be controlled, so pt sent to IR and had embolization of the GDA.  He was dc'd on 1/25. 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 Had further rebleeding here and went to OR 2/05 gen surg  w/ ex-lap, LOA and surgical ulcer repair.  Per GI/ surgery/ pmd. Now advancing diet.  ESRD: cont HD TTS. Next HD 2/14.  Anemia (ABLA + ESRD):  total 14U PRBCs + 2U FFP this admit. Surgical repair 2/05. Next esa due 2/11, have ordered darbe 150  q Sat 1st dose 2/11. Hgb 7.4 stable 2/13 MBD: Corr Ca ok.  Continue Renvela and VDRA. HTN/volume:  no vol excess on exam. UF as tolerated Nutrition: Albumin very low  - give protein supplements as tolerated. Advancing diet as tolerated.  Dispo: SNF? Per primary  Gean Quint, MD [facility] 11/29/2021,9:27 AM

## 2021-11-30 DIAGNOSIS — I5042 Chronic combined systolic (congestive) and diastolic (congestive) heart failure: Secondary | ICD-10-CM

## 2021-11-30 DIAGNOSIS — M79605 Pain in left leg: Secondary | ICD-10-CM

## 2021-11-30 DIAGNOSIS — E43 Unspecified severe protein-calorie malnutrition: Secondary | ICD-10-CM

## 2021-11-30 DIAGNOSIS — D62 Acute posthemorrhagic anemia: Secondary | ICD-10-CM

## 2021-11-30 DIAGNOSIS — M79604 Pain in right leg: Secondary | ICD-10-CM | POA: Insufficient documentation

## 2021-11-30 DIAGNOSIS — I480 Paroxysmal atrial fibrillation: Secondary | ICD-10-CM

## 2021-11-30 NOTE — Progress Notes (Signed)
PROGRESS NOTE  Timothy [name] LDJ:[id number] DOB: [birth year]   PCP: [name], MD  Patient is from: Home  DOA: 11/12/2021 LOS: 42  Chief complaints:  Chief Complaint  Patient presents with   Rectal Bleeding     Brief Narrative / Interim history: 61 year old gentleman with history of ESRD TTS schedule, GERD with duodenal ulcer, hypertension, chronic pain on oxycodone who was brought in by EMS after he was found to have large rectal bleeding with acute blood loss related anemia, he was found to have a hemoglobin of 5.1 in the ER and admitted to ICU.  He required multiple units of packed RBC transfusion, he was seen by GI underwent EGD on 11/16/2021 showing a duodenal ulcer with spurting blood and adherent clot, IR was consulted and he subsequently underwent embolization on 11/16/2021.  H&H stabilized somewhat and he was transferred to hospitalist service on 11/19/2021.   EXPLORATORY LAPAROTOMY, REPAIR OF BLEEDING DUODENAL ULCER 11/20/2021.  Diet has been advanced and GS has signed off.  Issue currently is placement.   Subjective: Seen and examined earlier this morning.  Complains bilateral feet pain and swelling.  Worse with elevation.  Describes the pain as throbbing.  Objective: Vitals:   11/30/21 0338 11/30/21 0915 11/30/21 1206 11/30/21 1512  BP: (!) 147/94 (!) 158/97 (!) 152/88 (!) 157/99  Pulse: 92 92 90 92  Resp: 18   18  Temp: 98.2 F (36.8 C) 98.2 F (36.8 C) 98.9 F (37.2 C) 98 F (36.7 C)  TempSrc: Oral Oral Oral Oral  SpO2: 96% 99% 98% 98%  Weight:      Height:        Examination:  GENERAL: No apparent distress.  Nontoxic. HEENT: MMM.  Vision and hearing grossly intact.  NECK: Supple.  No apparent JVD.  RESP: 98% on RA.  No IWOB.  Fair aeration bilaterally. CVS:  RRR. Heart sounds normal.  ABD/GI/GU: BS+. Abd soft, NTND.  MSK/EXT:  Moves extremities.  Significant muscle mass and subcu fat loss.  Bilateral pedal edema. SKIN: no apparent skin lesion or  wound NEURO: Awake, alert and oriented appropriately.  No apparent focal neuro deficit. PSYCH: Calm. Normal affect.   Procedures:  As above   Assessment and Plan: * Acute blood loss anemia due to bleeding duodenal ulcer- (present on admission) Recent Labs    11/20/21 1050 11/20/21 1308 11/20/21 1639 11/21/21 0045 11/22/21 0845 11/23/21 0300 11/24/21 0356 11/25/21 0249 11/27/21 0353 11/28/21 0442  HGB 8.2* 9.1* 9.6* 9.4* 7.9* 7.6* 8.0* 8.9* 7.2* 7.4*  -S/p multiple blood transfusions in ICU. -EGD revealed bleeding duodenal ulcer.  -Patient underwent IR embolization on 2/1. -Massive bloody BM on 2/4 requiring emergent laparotomy with suturing of the duodenal ulcer on 2/5. -Upper GI series negative for leaks. -Tolerating soft diet. -H&H relatively stable  Hemorrhagic shock ([place]) See ABLA  ESRD on HD TTS Per nephrology.  Paroxysmal A-fib (HCC) CHA2DS2-VASc score 2.  Poor candidate for anticoagulation given GI bleed. -Resume Coreg at a lower dose  Bilateral lower extremity pain Per patient, worse with elevation raising concern for PAD.  Could be neuropathic as well -Check ABI -Continue pain medications  Chronic combined systolic and diastolic CHF (congestive heart failure) ([place])- (present on admission) Appears euvolemic. -Fluid management with dialysis  Protein-calorie malnutrition, severe- (present on admission) See below         Body mass index is 24.05 kg/m. Nutrition Problem: Severe Malnutrition Etiology: chronic illness (CHF, polysubstance abuse, ESRD) Signs/Symptoms: severe fat depletion, severe muscle depletion Interventions:  MVI, Nepro shake   DVT prophylaxis:  Place and maintain sequential compression device Start: 11/27/21 1033 Place TED hose Start: 11/19/21 0900 SCDs Start: 11/12/21 2005  Code Status: Full code Family Communication: Updated patient's wife at bedside. Level of care: Med-Surg Status is: Inpatient  Remains inpatient  appropriate because: Lack of safe disposition/SNF     Consultants:  Pulmonology GI IR General surgery Nephrology   Sch Meds:  Scheduled Meds:  sodium chloride   Intravenous Once   Chlorhexidine Gluconate Cloth  6 each Topical Daily   darbepoetin (ARANESP) injection - DIALYSIS  150 mcg Intravenous Q Sat-HD   doxercalciferol  2 mcg Intravenous Q T,Th,Sa-HD   feeding supplement (NEPRO CARB STEADY)  237 mL Oral TID BM   multivitamin  1 tablet Oral QHS   pantoprazole  40 mg Oral BID   polyethylene glycol  17 g Oral Daily   sucralfate  1 g Oral TID WC & HS   Continuous Infusions: PRN Meds:.acetaminophen, bisacodyl, dextrose, hydrALAZINE, HYDROmorphone (DILAUDID) injection, ipratropium-albuterol, metoprolol tartrate, oxyCODONE  Antimicrobials: Anti-infectives (From admission, onward)    Start     Dose/Rate Route Frequency Ordered Stop   11/20/21 0930  ceFAZolin (ANCEF) IVPB 2g/100 mL premix       See Hyperspace for full Linked Orders Report.   2 g 200 mL/hr over 30 Minutes Intravenous On call to O.R. 11/20/21 0831 11/20/21 1005   11/20/21 0930  metroNIDAZOLE (FLAGYL) IVPB 500 mg       See Hyperspace for full Linked Orders Report.   500 mg 100 mL/hr over 60 Minutes Intravenous On call to O.R. 11/20/21 0831 11/20/21 1010        I have personally reviewed the following labs and images: CBC: Recent Labs  Lab 11/24/21 0356 11/25/21 0249 11/27/21 0353 11/28/21 0442  WBC 9.1 7.7 7.5 9.7  HGB 8.0* 8.9* 7.2* 7.4*  HCT 24.3* 26.9* 22.7* 22.9*  MCV 86.2 85.9 87.6 88.4  PLT 363 425* 371 365   BMP &GFR Recent Labs  Lab 11/24/21 0356 11/24/21 0819 11/25/21 0249 11/27/21 0353  NA 128* 126* 128* 132*  K 4.1 3.7 3.3* 3.3*  CL 91* 91* 94* 96*  CO2 23 22 24 26   GLUCOSE 51* 57* 107* 101*  BUN 36* 37* 20 24*  CREATININE 4.91* 5.04* 3.64* 4.80*  CALCIUM 7.5* 7.5* 7.5* 7.4*  PHOS 6.0* 6.0*  --   --    Estimated Creatinine Clearance: 18.5 mL/min (A) (by C-G formula based  on SCr of 4.8 mg/dL (H)). Liver & Pancreas: Recent Labs  Lab 11/24/21 0356 11/24/21 0819  ALBUMIN 1.6* 1.5*   No results for input(s): LIPASE, AMYLASE in the last 168 hours. No results for input(s): AMMONIA in the last 168 hours. Diabetic: No results for input(s): HGBA1C in the last 72 hours. Recent Labs  Lab 11/24/21 1359  GLUCAP 65*   Cardiac Enzymes: No results for input(s): CKTOTAL, CKMB, CKMBINDEX, TROPONINI in the last 168 hours. No results for input(s): PROBNP in the last 8760 hours. Coagulation Profile: No results for input(s): INR, PROTIME in the last 168 hours. Thyroid Function Tests: No results for input(s): TSH, T4TOTAL, FREET4, T3FREE, THYROIDAB in the last 72 hours. Lipid Profile: No results for input(s): CHOL, HDL, LDLCALC, TRIG, CHOLHDL, LDLDIRECT in the last 72 hours. Anemia Panel: Recent Labs    11/29/21 0323  VITAMINB12 1,055*   Urine analysis:    Component Value Date/Time   COLORURINE YELLOW 05/19/2021 2015   APPEARANCEUR HAZY (A) 05/19/2021 2015  LABSPEC 1.012 05/19/2021 2015   PHURINE 9.0 (H) 05/19/2021 2015   GLUCOSEU NEGATIVE 05/19/2021 2015   HGBUR NEGATIVE 05/19/2021 2015   BILIRUBINUR NEGATIVE 05/19/2021 2015   KETONESUR NEGATIVE 05/19/2021 2015   PROTEINUR >=300 (A) 05/19/2021 2015   NITRITE NEGATIVE 05/19/2021 2015   LEUKOCYTESUR SMALL (A) 05/19/2021 2015   Sepsis Labs: Invalid input(s): PROCALCITONIN, [place]  Microbiology: No results found for this or any previous visit (from the past 240 hour(s)).  Radiology Studies: No results found.    Amogh Komatsu T. [place]  If 7PM-7AM, please contact night-coverage [email or web address] 11/30/2021, 5:16 PM

## 2021-11-30 NOTE — TOC Progression Note (Signed)
Transition of Care [facility]) - Progression Note    Patient Details  Name: Timothy [name] MRN: [id number] Date of Birth: 02-17-1961  Transition of Care [facility]) CM/SW [place], [place] Phone Number: 11/30/2021, 2:09 PM  Clinical Narrative:     TOC continues to follow  Patient has no bed offers  Thurmond Butts, MSW, LCSW Clinical Social Worker    Expected Discharge Plan: Skilled Nursing Facility Barriers to Discharge: SNF Pending bed offer, Awaiting State Approval (PASRR) (polysubsstance abuse)  Expected Discharge Plan and Services Expected Discharge Plan: [place] In-house Referral: Clinical Social Work     Living arrangements for the past 2 months: Apartment                                       Social Determinants of Health (SDOH) Interventions    Readmission Risk Interventions Readmission Risk Prevention Plan 11/16/2021 11/09/2021  Transportation Screening - Complete  Medication Review [employer]) Referral to Pharmacy Referral to Pharmacy  PCP or Specialist appointment within 3-5 days of discharge Not Complete Complete  PCP/Specialist Appt Not Complete comments patient not medically ready to d/c -  [place] or Home Care Consult Not Complete Patient refused  [place] or Home Care Consult Pt Refusal Comments not complete, waiting on PT recs -  SW Recovery Care/Counseling Consult Complete Complete  Palliative Care Screening Not Applicable Not [place] Not Complete Not Applicable  SNF Comments Pending PT recommendations (CSW following) -  Some recent data might be hidden

## 2021-11-30 NOTE — Progress Notes (Signed)
Pt A&Ox4 - bed alarm sounding up assisting patient he appeared very agitated that a bed alarm had been set. He states he is very well aware of how to use the call bell for assistance and has adamantly requested the bed alarm feature be disabled. Patient was educated on high fall risk and safety concerns. Patient states he has always asked for assistance and continued to request the bed alarm turned off. Primary care staff made aware.

## 2021-11-30 NOTE — Assessment & Plan Note (Addendum)
ABI normal. Could be neuropathic -Already on oxycodone. -Started low-dose gabapentin

## 2021-11-30 NOTE — Progress Notes (Signed)
[name] Progress Note   Subjective: Seen in room. Tolerated HD yesterday, net UF 2L. He reports that his LUE is swollen since HD (no reports of infiltrate or issues with access). He still has some swelling in his feet  Objective Vitals:   11/29/21 1528 11/29/21 1929 11/29/21 2358 11/30/21 0338  BP: (!) 147/90 (!) 138/92 135/89 (!) 147/94  Pulse: 89 (!) 109 97 92  Resp: 16 18 20 18   Temp: 97.6 F (36.4 C) 98.5 F (36.9 C) 98 F (36.7 C) 98.2 F (36.8 C)  TempSrc: Oral Oral Oral Oral  SpO2: 99% 98% 99% 96%  Weight:      Height:          Additional Objective Labs: Basic Metabolic Panel: Recent Labs  Lab 11/24/21 0356 11/24/21 0819 11/25/21 0249 11/27/21 0353  NA 128* 126* 128* 132*  K 4.1 3.7 3.3* 3.3*  CL 91* 91* 94* 96*  CO2 23 22 24 26   GLUCOSE 51* 57* 107* 101*  BUN 36* 37* 20 24*  CREATININE 4.91* 5.04* 3.64* 4.80*  CALCIUM 7.5* 7.5* 7.5* 7.4*  PHOS 6.0* 6.0*  --   --    CBC: Recent Labs  Lab 11/24/21 0356 11/25/21 0249 11/27/21 0353 11/28/21 0442  WBC 9.1 7.7 7.5 9.7  HGB 8.0* 8.9* 7.2* 7.4*  HCT 24.3* 26.9* 22.7* 22.9*  MCV 86.2 85.9 87.6 88.4  PLT 363 425* 371 365   Blood Culture    Component Value Date/Time   SDES PERITONEAL FLUID 06/22/2021 1236   SPECREQUEST ABDOMEN 06/22/2021 1236   CULT  06/22/2021 1236    NO GROWTH 3 DAYS Performed at [facility], [place] [address], [place], [place] [address]    REPTSTATUS 06/26/2021 FINAL 06/22/2021 1236     Physical Exam General: Lying in bed, nontoxic appearing, nad  Heart: RRR No n,r,g  Lungs: Clear bilaterally  Abdomen: soft non-tender Extremities: trace pedal edema Dialysis Access: LUE AVF +b/t (LUE swollen)  Medications:   sodium chloride   Intravenous Once   Chlorhexidine Gluconate Cloth  6 each Topical Daily   darbepoetin (ARANESP) injection - DIALYSIS  150 mcg Intravenous Q Sat-HD   doxercalciferol  2 mcg Intravenous Q T,Th,Sa-HD   feeding supplement (NEPRO  CARB STEADY)  237 mL Oral TID BM   multivitamin  1 tablet Oral QHS   pantoprazole  40 mg Oral BID   polyethylene glycol  17 g Oral Daily   sucralfate  1 g Oral TID WC & HS    [facility] HD:  [facility]  3h 63min  450/1.5  65kg  2/2 bath  P4   L AVF  Hep none -Hectorol 2 mcg IV TIW -Mircera 150 mcg IV q 2 weeks (last 1/28  > due 2/11) -Venofer 100 mg IV weekly   Assessment/Plan: Recurrent GI bleed: sp EGD 1/21 that showed active bleeding from duodenal ulcer that could not be controlled, so pt sent to IR and had embolization of the GDA.  He was dc'd on 1/25. 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 Had further rebleeding here and went to OR 2/05 gen surg  w/ ex-lap, LOA and surgical ulcer repair.  Per GI/ surgery/ pmd. Now advancing diet.  ESRD: cont HD TTS. Next HD 2/16. Access: LUE AVF with swollen arm. Instructed him to keep his arm elevated, discussed with RN.  Still has good b/t.  If persistent or any issues, can consult [facility] Anemia (ABLA + ESRD):  total 14U PRBCs + 2U FFP this admit. Surgical repair 2/05. Next esa due 2/11, have ordered darbe 150 q Sat 1st dose 2/11. Hgb 7.4 stable 2/13 MBD: Corr Ca ok.  Continue Renvela and VDRA. HTN/volume: UF as tolerated Nutrition: Albumin very low  - give protein supplements as tolerated. Advancing diet as tolerated.  Dispo: Per primary  Gean Quint, MD [place] 11/30/2021,8:50 AM

## 2021-11-30 NOTE — Assessment & Plan Note (Addendum)
-  HD per nephrology.

## 2021-11-30 NOTE — Progress Notes (Signed)
Physical Therapy Treatment Patient Details Name: Timothy [name] MRN: [id number] DOB: 1961/05/02 Today's Date: 11/30/2021   History of Present Illness 61 y/o male presented to ED on 11/12/21 after being found on floor with rectal bleeding. Multiple recent admissions for CHF volume overload, encephalopathy, pulmonary edema, and GI bleed. CTA showed large pericardial effusion and ascites. EGD on 1/30 demonstrated duodenal ulcer with overlying adherent clot but no signs of active bleeding. Pt underwent exploratory laparotomy and repair of bleeding duodenal ulcer on 2/5.  PMH: ESRD (HD TTS), colon cancer, CHF, polysubstance abuse    PT Comments    Pt agreed to participate in therapy with encouragement. He said he would get shoes dropped off to minimize pain with weightbearing. He was able to walk in the hallway with a walker with supervision. He attempted ambulating on one stair and had lost his balance requiring min assist to regain balance. During treatment session patient showed deficits in balance, strength, endurance, activity tolerance, as well as pain with activity. Recommending therapy services at skilled nursing as most appropriate recommendation for therapy to address the previously stated deficits. Pt is potentially limited with skilled nursing due to number of bed offers and social history. Will continue to follow acutely with intense acute rehab to maximize functional mobility, independence and safety.   Recommendations for follow up therapy are one component of a multi-disciplinary discharge planning process, led by the attending physician.  Recommendations may be updated based on patient status, additional functional criteria and insurance authorization.  Follow Up Recommendations  Skilled nursing-short term rehab (<3 hours/day)     Assistance Recommended at Discharge Intermittent Supervision/Assistance  Patient can return home with the following Help with stairs or ramp for entrance;A  little help with walking and/or transfers;Assistance with cooking/housework;Assist for transportation   Equipment Recommendations  Rollator (4 wheels)    Recommendations for Other Services       Precautions / Restrictions Precautions Precautions: Fall Restrictions Weight Bearing Restrictions: No     Mobility  Bed Mobility Overal bed mobility: Needs Assistance         Sit to supine: Supervision        Transfers Overall transfer level: Needs assistance Equipment used: Rolling walker (2 wheels)               General transfer comment: Patient was in the bathroom at the start of the session. He still requires supervision with transfers due to unsteadiness on feet and distraction due to pain in his feet.    Ambulation/Gait Ambulation/Gait assistance: Supervision Gait Distance (Feet): 200 Feet (with intermitant standing breaks due to pain) Assistive device: Rolling walker (2 wheels) Gait Pattern/deviations: Step-through pattern, Decreased stride length, Trunk flexed Gait velocity: decreased         Stairs Stairs: Yes Stairs assistance: Min assist Stair Management: One rail Right, Sideways Number of Stairs: 1 General stair comments: Patient had pain with taking one step up and came back down sideways. He required min assist due to loss of balance.   Wheelchair Mobility    Modified Rankin (Stroke Patients Only)       Balance Overall balance assessment: Needs assistance Sitting-balance support: No upper extremity supported, Feet supported Sitting balance-Leahy Scale: Good     Standing balance support: During functional activity, Bilateral upper extremity supported, Reliant on assistive device for balance Standing balance-Leahy Scale: Fair Standing balance comment: Pt required supervision with standing balance due to being distracted by pain in his feet and and heavy reliance on assistive device.  Cognition  Arousal/Alertness: Awake/alert Behavior During Therapy: WFL for tasks assessed/performed Overall Cognitive Status: Within Functional Limits for tasks assessed                                 General Comments: Pt was a littel resistant to therapy but agreed to participate in session and attempt steps.        Exercises      General Comments General comments (skin integrity, edema, etc.): Pt was educated on importance of movement since he has been in bed for so long. He was educated on proper foot wear and will have someone bring in his shoes to try to make a change in his pain with ambulation and stairs.      Pertinent Vitals/Pain Pain Assessment Pain Assessment: Faces Faces Pain Scale: Hurts whole lot Pain Location: in his feet and legs during ambulation Pain Descriptors / Indicators: Burning, Tingling    Home Living                          Prior Function            PT Goals (current goals can now be found in the care plan section)      Frequency    Min 3X/week      PT Plan Current plan remains appropriate    Co-evaluation              AM-PAC PT "6 Clicks" Mobility   Outcome Measure  Help needed turning from your back to your side while in a flat bed without using bedrails?: A Little Help needed moving from lying on your back to sitting on the side of a flat bed without using bedrails?: A Little Help needed moving to and from a bed to a chair (including a wheelchair)?: A Little Help needed standing up from a chair using your arms (e.g., wheelchair or bedside chair)?: A Little Help needed to walk in hospital room?: A Little Help needed climbing 3-5 steps with a railing? : A Lot 6 Click Score: 17    End of Session Equipment Utilized During Treatment: Gait belt Activity Tolerance: Patient limited by pain Patient left: in bed;with call bell/phone within reach;with bed alarm set   PT Visit Diagnosis: Other abnormalities of gait and  mobility (R26.89);Muscle weakness (generalized) (M62.81)     Time: 9937-1696 PT Time Calculation (min) (ACUTE ONLY): 12 min  Charges:  $Gait Training: 8-22 mins                     Quenton Fetter, SPT    Quenton Fetter 11/30/2021, 1:57 PM

## 2021-11-30 NOTE — Progress Notes (Signed)
Nutrition Follow-up  DOCUMENTATION CODES:   Severe malnutrition in context of chronic illness  INTERVENTION:   - Continue Nepro Shake po TID, each supplement provides 425 kcal and 19 grams protein   - Continue renal MVI daily  NUTRITION DIAGNOSIS:   Severe Malnutrition related to chronic illness (CHF, polysubstance abuse, ESRD) as evidenced by severe fat depletion, severe muscle depletion.  Ongoing, being addressed via diet advancement and oral nutrition supplements  GOAL:   Patient will meet greater than or equal to 90% of their needs  Progressing  MONITOR:   PO intake, Supplement acceptance, Diet advancement, Labs, Weight trends, Skin, I & O's  REASON FOR ASSESSMENT:   NPO/Clear Liquid Diet    ASSESSMENT:   61 year old male who presented to the ED on 1/28 with hematemesis and hematochezia. PMH of recent successful GDA embolization for very large duodenal bulb ulcer with overlying clot on 11/05/21, ESRD on HD, anemia, HTN, CHF, pericardial effusion, colon cancer s/p R colectomy in 2014, polysubstance abuse, laparotomy for lysis of adhesions for bowel obstruction in August 2022.  01/30 - s/p EGD with findings of non-bleeding duodenal ulcer with an adherent clot 01/31 - transferred to ICU 02/01 - s/p EGD with findings of non-obstructing spurting duodenal ulcer with spurting hemorrhage which was injected with epinephrine and hemostatic spray used, s/p mesenteric angiogram and embolization of anterior and posterior pancreaticoduodenal arcade from SMA approach in IR 02/02 - diet advanced to renal with fluid restriction 02/05 - NPO, s/p ex-lap, lysis of adhesions, transduodenal suture ligation of duodenal ulcer 02/06 - pt removed NG tube, NG tube replaced 02/08 - NG tube removed 02/09 - UGI without evidence of leak, clear liquids 02/10 - full liquids 02/11 - soft diet  Last HD was today with 3279 ml net UF. Post-HD weight of 75.8 kg. Noted phosphorus lab was low; reached out  to Nephrology MD with request to replete.  Attempted to speak with pt at bedside. Pt sleeping soundly and did not awaken to RD voice or touch. Pt with good PO intake and all 100% meal completions on soft diet. Pt accepting most Nepro supplements. Pt with 100% completed Nepro supplement on bedside table at time of RD visit. Will continue with current nutrition interventions.  EDW: 65 kg Admit weight: 74.4 kg Current weight: 75.8 kg  Pt with deep pitting edema to LUE and BLE.  Meal Completion: 100% x 1 meal on 2/11 100% x 2 meals on 2/12 No meal documentation on 2/13 100% x 2 meals on 2/14 No meal documentation on 2/15 100% x 1 meal on 2/16  Medications reviewed and include: aranesp weekly, hectorol with HD, Nepro Shake TID, renavit, protonix, miralax, sucralfate  Labs reviewed: sodium 130, phosphorus 1.9, hemoglobin 7.3  Diet Order:   Diet Order             Diet NPO time specified  Diet effective midnight           DIET SOFT Room service appropriate? Yes; Fluid consistency: Thin; Fluid restriction: 1200 mL Fluid  Diet effective now                   EDUCATION NEEDS:   Education needs have been addressed  Skin:  Skin Assessment: Skin Integrity Issues: Stage II: coccyx  Last BM:  11/24/21 large type 7  Height:   Ht Readings from Last 1 Encounters:  11/20/21 6\' 1"  (1.854 m)    Weight:   Wt Readings from Last 1 Encounters:  12/01/21  75.8 kg    BMI:  Body mass index is 22.05 kg/m.  Estimated Nutritional Needs:   Kcal:  2100-2300  Protein:  100-120 grams  Fluid:  >/= 2.0 L    Gustavus Bryant, MS, RD, LDN Inpatient Clinical Dietitian Please see AMiON for contact information.

## 2021-11-30 NOTE — Assessment & Plan Note (Signed)
CHA2DS2-VASc score 2.  Poor candidate for anticoagulation given GI bleed. -Resume Coreg at a lower dose

## 2021-11-30 NOTE — Hospital Course (Addendum)
61 year old gentleman with history of ESRD TTS schedule, GERD with duodenal ulcer, hypertension, chronic pain on oxycodone who was brought in by EMS after he was found to have large rectal bleeding with acute blood loss related anemia, he was found to have a hemoglobin of 5.1 in the ER and admitted to ICU.  He required multiple units of packed RBC transfusion, he was seen by GI underwent EGD on 11/16/2021 showing a duodenal ulcer with spurting blood and adherent clot, IR was consulted and he subsequently underwent embolization on 11/16/2021.  Patient was stabilized and transferred to [facility] hospitalist service on 11/19/2021.  However, he had a massive bloody bowel movements the night of 2/4 requiring emergent laparotomy with bleeding duodenal ulcer repair on 2/5.  Eventually, bleeding since stopped.  Hemoglobin stabilized.  He was advanced to soft diet.  General surgery signed off but returned to remove staples on 12/02/2021 prior to discharge.  He is discharged on p.o. Protonix and Carafate.  Aspirin discontinued.  Advised to avoid NSAIDs.  Hospital course also complicated by LUE swelling.  He had a normal fistulogram on 12/02/2020.  Therapy initially recommended SNF but patient's physical condition improved.  The recommendation has changed to home health with DME.   On the day of discharge, cleared for discharge by surgery, nephrology, vascular surgery and GI.

## 2021-11-30 NOTE — Assessment & Plan Note (Addendum)
As evidenced by significant muscle mass and subcu fat loss and low BMI Nutrition Problem: Severe Malnutrition Etiology: chronic illness (CHF, polysubstance abuse, ESRD) Signs/Symptoms: severe fat depletion, severe muscle depletion Interventions: MVI, Nepro shake

## 2021-11-30 NOTE — Assessment & Plan Note (Addendum)
Appears euvolemic except for LUE swelling and bilateral pedal swelling. -Fluid management with dialysis

## 2021-11-30 NOTE — Assessment & Plan Note (Addendum)
See ABLA

## 2021-11-30 NOTE — Assessment & Plan Note (Addendum)
Recent Labs    11/20/21 1639 11/21/21 0045 11/22/21 0845 11/23/21 8592 11/24/21 0356 11/25/21 0249 11/27/21 0353 11/28/21 0442 12/01/21 0445 12/02/21 0054  HGB 9.6* 9.4* 7.9* 7.6* 8.0* 8.9* 7.2* 7.4* 7.3* 7.3*  -S/p multiple blood transfusions in ICU. -EGD revealed bleeding duodenal ulcer.  -Underwent IR embolization on 2/1. -Emergent laparotomy duodenal ulcer repair on 2/5 after massive bloody stool -Upper GI series negative for leaks.  Staples removed on 2/17. -Tolerating soft diet. -H&H relatively stable.  -EPO and IV iron per nephrology. -Recheck CBC in 1 week

## 2021-12-01 ENCOUNTER — Encounter (HOSPITAL_COMMUNITY): Payer: Medicare Other

## 2021-12-01 ENCOUNTER — Inpatient Hospital Stay (HOSPITAL_COMMUNITY): Payer: Medicare (Managed Care)

## 2021-12-01 DIAGNOSIS — T82590A Other mechanical complication of surgically created arteriovenous fistula, initial encounter: Secondary | ICD-10-CM

## 2021-12-01 DIAGNOSIS — N186 End stage renal disease: Secondary | ICD-10-CM

## 2021-12-01 DIAGNOSIS — Z992 Dependence on renal dialysis: Secondary | ICD-10-CM

## 2021-12-01 DIAGNOSIS — R2232 Localized swelling, mass and lump, left upper limb: Secondary | ICD-10-CM

## 2021-12-01 DIAGNOSIS — M7989 Other specified soft tissue disorders: Secondary | ICD-10-CM

## 2021-12-01 DIAGNOSIS — I160 Hypertensive urgency: Secondary | ICD-10-CM

## 2021-12-01 LAB — RENAL FUNCTION PANEL
Albumin: 1.5 g/dL — ABNORMAL LOW (ref 3.5–5.0)
Anion gap: 10 (ref 5–15)
BUN: 51 mg/dL — ABNORMAL HIGH (ref 6–20)
CO2: 24 mmol/L (ref 22–32)
Calcium: 7.8 mg/dL — ABNORMAL LOW (ref 8.9–10.3)
Chloride: 96 mmol/L — ABNORMAL LOW (ref 98–111)
Creatinine, Ser: 6.99 mg/dL — ABNORMAL HIGH (ref 0.61–1.24)
GFR, Estimated: 8 mL/min — ABNORMAL LOW (ref >60)
Glucose, Bld: 83 mg/dL (ref 70–99)
Phosphorus: 1.9 mg/dL — ABNORMAL LOW (ref 2.5–4.6)
Potassium: 3.6 mmol/L (ref 3.5–5.1)
Sodium: 130 mmol/L — ABNORMAL LOW (ref 135–145)

## 2021-12-01 LAB — HEMOGLOBIN AND HEMATOCRIT, BLOOD
HCT: 23.1 % — ABNORMAL LOW (ref 39.0–52.0)
Hemoglobin: 7.3 g/dL — ABNORMAL LOW (ref 13.0–17.0)

## 2021-12-01 LAB — MAGNESIUM: Magnesium: 2 mg/dL (ref 1.7–2.4)

## 2021-12-01 MED ORDER — CARVEDILOL 3.125 MG PO TABS
3.1250 mg | ORAL_TABLET | Freq: Two times a day (BID) | ORAL | Status: DC
  Administered 2021-12-02 (×2): 3.125 mg via ORAL
  Filled 2021-12-01 (×2): qty 1

## 2021-12-01 MED ORDER — GABAPENTIN 100 MG PO CAPS: 100.0000 mg | ORAL_CAPSULE | Freq: Every day | ORAL | Status: DC

## 2021-12-01 NOTE — Progress Notes (Signed)
PROGRESS NOTE  Timothy [name] GGE:[id number] DOB: 06/30/1961   PCP: [name], MD  Patient is from: Home  DOA: 11/12/2021 LOS: 58  Chief complaints:  Chief Complaint  Patient presents with   Rectal Bleeding     Brief Narrative / Interim history: 61 year old gentleman with history of ESRD TTS schedule, GERD with duodenal ulcer, hypertension, chronic pain on oxycodone who was brought in by EMS after he was found to have large rectal bleeding with acute blood loss related anemia, he was found to have a hemoglobin of 5.1 in the ER and admitted to ICU.  He required multiple units of packed RBC transfusion, he was seen by GI underwent EGD on 11/16/2021 showing a duodenal ulcer with spurting blood and adherent clot, IR was consulted and he subsequently underwent embolization on 11/16/2021.  H&H stabilized somewhat and he was transferred to hospitalist service on 11/19/2021.   EXPLORATORY LAPAROTOMY, REPAIR OF BLEEDING DUODENAL ULCER 11/20/2021. Diet has been advanced and GS has signed off.  Staples in place. Patient has LUE swelling.  Fistula seems to be functioning.  Plan for fistulogram on 2/17.  Eventually SNF when bed available.    Subjective: Seen and examined earlier this morning while on dialysis.  Seems to be sleeping with eyes closed but wakes up very easily.  Complains bilateral feet pain and LUE swelling.  Objective: Vitals:   12/01/21 1130 12/01/21 1201 12/01/21 1522 12/01/21 1922  BP: 133/76 138/86 134/75 135/71  Pulse: 89 90 99 86  Resp:  17 18 16   Temp:  98.6 F (37 C) 98.1 F (36.7 C) 98.1 F (36.7 C)  TempSrc:  Oral Oral Oral  SpO2:  99% 92%   Weight:  75.8 kg    Height:        Examination: GENERAL: Frail looking.  Nontoxic. HEENT: MMM.  Vision and hearing grossly intact.  NECK: Supple.  No apparent JVD.  RESP: 99% on RA.  No IWOB.  Fair aeration bilaterally. CVS:  RRR. Heart sounds normal.  ABD/GI/GU: BS+. Abd soft.  Slightly tender.  Staples in  place. MSK/EXT:  Moves extremities.  LUE swelling/edema.  Fistula with good bruits.  Bilateral pedal edema SKIN: no apparent skin lesion or wound NEURO: Awake and alert. Oriented appropriately.  No apparent focal neuro deficit. PSYCH: Calm. Normal affect.   Procedures:  As above   Assessment and Plan: * Acute blood loss anemia due to bleeding duodenal ulcer- (present on admission) Recent Labs    11/20/21 1308 11/20/21 1639 11/21/21 0045 11/22/21 0845 11/23/21 4650 11/24/21 0356 11/25/21 0249 11/27/21 0353 11/28/21 0442 12/01/21 0445  HGB 9.1* 9.6* 9.4* 7.9* 7.6* 8.0* 8.9* 7.2* 7.4* 7.3*  -S/p multiple blood transfusions in ICU. -EGD revealed bleeding duodenal ulcer.  -Underwent IR embolization on 2/1. -Emergent laparotomy duodenal ulcer repair on 2/5 after massive bloody stool -Upper GI series negative for leaks. -Tolerating soft diet. -H&H relatively stable.   Hemorrhagic shock ([place]) See ABLA  ESRD on HD TTS -HD per nephrology.  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  -Fistulogram on 2/17  Paroxysmal A-fib (HCC) CHA2DS2-VASc score 2.  Poor candidate for anticoagulation given GI bleed. -Resume Coreg at a lower dose  Bilateral lower extremity pain ABI normal. Could be neuropathic -Try low dose gabapentin -Already on oxycodone  Chronic combined systolic and diastolic CHF (congestive heart failure) (HCC)- (present on admission) Appears euvolemic except for LUE swelling and bilateral pedal swelling. -Fluid management with dialysis  Acute postoperative pain  of abdomen- (present on admission) -On p.o. oxycodone and IV Dilaudid for pain control -Bowel regimen for constipation  Protein-calorie malnutrition, severe- (present on admission) As evidenced by significant muscle mass and subcu fat loss and low BMI Nutrition Problem: Severe Malnutrition Etiology: chronic illness (CHF, polysubstance abuse, ESRD) Signs/Symptoms:  severe fat depletion, severe muscle depletion Interventions: MVI, Nepro shake   DVT prophylaxis:  Place and maintain sequential compression device Start: 11/27/21 1033 Place TED hose Start: 11/19/21 0900 SCDs Start: 11/12/21 2005  Code Status: Full code Family Communication: Updated patient's wife at bedside on 2/15.  None at bedside today.. Level of care: Med-Surg Status is: Inpatient  Remains inpatient appropriate because: Fistulogram and lack of safe disposition/SNF     Consultants:  Pulmonology-signed off IR-signed off General surgery-signed off Nephrology Vascular surgery   Sch Meds:  Scheduled Meds:  sodium chloride   Intravenous Once   [START ON 12/02/2021] carvedilol  3.125 mg Oral BID WC   Chlorhexidine Gluconate Cloth  6 each Topical Daily   darbepoetin (ARANESP) injection - DIALYSIS  150 mcg Intravenous Q Sat-HD   doxercalciferol  2 mcg Intravenous Q T,Th,Sa-HD   feeding supplement (NEPRO CARB STEADY)  237 mL Oral TID BM   [START ON 12/02/2021] gabapentin  100 mg Oral QHS   multivitamin  1 tablet Oral QHS   pantoprazole  40 mg Oral BID   polyethylene glycol  17 g Oral Daily   sucralfate  1 g Oral TID WC & HS   Continuous Infusions: PRN Meds:.acetaminophen, bisacodyl, dextrose, hydrALAZINE, HYDROmorphone (DILAUDID) injection, ipratropium-albuterol, metoprolol tartrate, oxyCODONE  Antimicrobials: Anti-infectives (From admission, onward)    Start     Dose/Rate Route Frequency Ordered Stop   11/20/21 0930  ceFAZolin (ANCEF) IVPB 2g/100 mL premix       See Hyperspace for full Linked Orders Report.   2 g 200 mL/hr over 30 Minutes Intravenous On call to O.R. 11/20/21 0831 11/20/21 1005   11/20/21 0930  metroNIDAZOLE (FLAGYL) IVPB 500 mg       See Hyperspace for full Linked Orders Report.   500 mg 100 mL/hr over 60 Minutes Intravenous On call to O.R. 11/20/21 0831 11/20/21 1010        I have personally reviewed the following labs and images: CBC: Recent  Labs  Lab 11/25/21 0249 11/27/21 0353 11/28/21 0442 12/01/21 0445  WBC 7.7 7.5 9.7  --   HGB 8.9* 7.2* 7.4* 7.3*  HCT 26.9* 22.7* 22.9* 23.1*  MCV 85.9 87.6 88.4  --   PLT 425* 371 365  --    BMP &GFR Recent Labs  Lab 11/25/21 0249 11/27/21 0353 12/01/21 0445  NA 128* 132* 130*  K 3.3* 3.3* 3.6  CL 94* 96* 96*  CO2 24 26 24   GLUCOSE 107* 101* 83  BUN 20 24* 51*  CREATININE 3.64* 4.80* 6.99*  CALCIUM 7.5* 7.4* 7.8*  MG  --   --  2.0  PHOS  --   --  1.9*   Estimated Creatinine Clearance: 12 mL/min (A) (by C-G formula based on SCr of 6.99 mg/dL (H)). Liver & Pancreas: Recent Labs  Lab 12/01/21 0445  ALBUMIN 1.5*   No results for input(s): LIPASE, AMYLASE in the last 168 hours. No results for input(s): AMMONIA in the last 168 hours. Diabetic: No results for input(s): HGBA1C in the last 72 hours. No results for input(s): GLUCAP in the last 168 hours.  Cardiac Enzymes: No results for input(s): CKTOTAL, CKMB, CKMBINDEX, TROPONINI in the last  168 hours. No results for input(s): PROBNP in the last 8760 hours. Coagulation Profile: No results for input(s): INR, PROTIME in the last 168 hours. Thyroid Function Tests: No results for input(s): TSH, T4TOTAL, FREET4, T3FREE, THYROIDAB in the last 72 hours. Lipid Profile: No results for input(s): CHOL, HDL, LDLCALC, TRIG, CHOLHDL, LDLDIRECT in the last 72 hours. Anemia Panel: Recent Labs    11/29/21 0323  VITAMINB12 1,055*   Urine analysis:    Component Value Date/Time   COLORURINE YELLOW 05/19/2021 2015   APPEARANCEUR HAZY (A) 05/19/2021 2015   LABSPEC 1.012 05/19/2021 2015   PHURINE 9.0 (H) 05/19/2021 2015   GLUCOSEU NEGATIVE 05/19/2021 2015   HGBUR NEGATIVE 05/19/2021 2015   BILIRUBINUR NEGATIVE 05/19/2021 2015   [place] 05/19/2021 2015   PROTEINUR >=300 (A) 05/19/2021 2015   NITRITE NEGATIVE 05/19/2021 2015   LEUKOCYTESUR SMALL (A) 05/19/2021 2015   Sepsis Labs: Invalid input(s): PROCALCITONIN,  [place]  Microbiology: No results found for this or any previous visit (from the past 240 hour(s)).  Radiology Studies: VAS [place] ABI WITH/WO TBI  Result Date: 12/01/2021  LOWER EXTREMITY DOPPLER STUDY Patient Name:  Timothy [name]  Date of Exam:   12/01/2021 Medical Rec #: [id number]        Accession #:    [id number] Date of Birth: [birth year]        Patient Gender: M Patient Age:   13 years Exam Location:  [facility] Procedure:      VAS [place] ABI WITH/WO TBI Referring Phys: Lucillia Corson --------------------------------------------------------------------------------  High Risk Factors: Hypertension. Other Factors: GI bleed. CHF. Cocaine and marijuana abuse. ESRD, on dialysis.  Limitations: Today's exam was limited due to Bilateral feet throbbing and              swelling. Comparison Study: No prior study Performing Technologist: Sharion Dove RVS  Examination Guidelines: A complete evaluation includes at minimum, Doppler waveform signals and systolic blood pressure reading at the level of bilateral brachial, anterior tibial, and posterior tibial arteries, when vessel segments are accessible. Bilateral testing is considered an integral part of a complete examination. Photoelectric Plethysmograph (PPG) waveforms and toe systolic pressure readings are included as required and additional duplex testing as needed. Limited examinations for reoccurring indications may be performed as noted.  ABI Findings: +---------+------------------+-----+-----------+--------+  Right     Rt Pressure (mmHg) Index Waveform    Comment   +---------+------------------+-----+-----------+--------+  Brachial  140                      multiphasic           +---------+------------------+-----+-----------+--------+  PTA       175                1.25  multiphasic           +---------+------------------+-----+-----------+--------+  DP        173                1.24  multiphasic            +---------+------------------+-----+-----------+--------+  Great Toe 134                0.96                        +---------+------------------+-----+-----------+--------+ +---------+------------------+-----+-----------+---------------+  Left      Lt Pressure (mmHg) Index Waveform    Comment          +---------+------------------+-----+-----------+---------------+  Brachial                                       dialysis access  +---------+------------------+-----+-----------+---------------+  PTA       179                1.28  multiphasic                  +---------+------------------+-----+-----------+---------------+  DP        180                1.29  multiphasic                  +---------+------------------+-----+-----------+---------------+  Great Toe 148                1.06                               +---------+------------------+-----+-----------+---------------+ +-------+-----------+-----------+------------+------------+  ABI/TBI Today's ABI Today's TBI Previous ABI Previous TBI  +-------+-----------+-----------+------------+------------+  Right   1.25        0.96                                   +-------+-----------+-----------+------------+------------+  Left    1.29        1.06                                   +-------+-----------+-----------+------------+------------+  Summary: Right: Resting right ankle-brachial index is within normal range. No evidence of significant right lower extremity arterial disease. The right toe-brachial index is normal. Left: Resting left ankle-brachial index is within normal range. No evidence of significant left lower extremity arterial disease. The left toe-brachial index is normal.  *See table(s) above for measurements and observations.     Preliminary       Antara Brecheisen T. Hamberg  If 7PM-7AM, please contact night-coverage [email or web address] 12/01/2021, 9:56 PM

## 2021-12-01 NOTE — Assessment & Plan Note (Deleted)
See above

## 2021-12-01 NOTE — Progress Notes (Signed)
VASCULAR LAB    ABIs have been performed.  See CV proc for preliminary results.   Mervyn Pflaum, RVT 12/01/2021, 3:09 PM

## 2021-12-01 NOTE — Progress Notes (Signed)
[facility] Progress Note   Subjective: Seen and examined on dialysis. He's upset because he's being told that he has too much fluid on (UF goal 3-4L on HD today). When asked about swelling he does report leg swelling and scrotal swelling. LUE swelling has not improved as compared to yesterday (no reports of infiltrate at prev HD). He otherwise is tolerating HD.  Objective Vitals:   12/01/21 0930 12/01/21 1000 12/01/21 1030 12/01/21 1100  BP: (!) 154/94 135/82 (!) 147/87 132/79  Pulse: 88 89 89 91  Resp: 18     Temp:      TempSrc:      SpO2:      Weight:      Height:          Additional Objective Labs: Basic Metabolic Panel: Recent Labs  Lab 11/25/21 0249 11/27/21 0353 12/01/21 0445  NA 128* 132* 130*  K 3.3* 3.3* 3.6  CL 94* 96* 96*  CO2 24 26 24   GLUCOSE 107* 101* 83  BUN 20 24* 51*  CREATININE 3.64* 4.80* 6.99*  CALCIUM 7.5* 7.4* 7.8*  PHOS  --   --  1.9*   CBC: Recent Labs  Lab 11/25/21 0249 11/27/21 0353 11/28/21 0442 12/01/21 0445  WBC 7.7 7.5 9.7  --   HGB 8.9* 7.2* 7.4* 7.3*  HCT 26.9* 22.7* 22.9* 23.1*  MCV 85.9 87.6 88.4  --   PLT 425* 371 365  --    Blood Culture    Component Value Date/Time   SDES PERITONEAL FLUID 06/22/2021 1236   SPECREQUEST ABDOMEN 06/22/2021 1236   CULT  06/22/2021 1236    NO GROWTH 3 DAYS Performed at [facility], [place] [address], [place], [place] [address]    REPTSTATUS 06/26/2021 FINAL 06/22/2021 1236     Physical Exam General: Lying in bed, nontoxic appearing, nad  Heart: RRR No n,r,g  Lungs: Clear bilaterally  Abdomen: soft non-tender Extremities: b/l LE edema Neuro: awake, alert Dialysis Access: LUE AVF +b/t (LUE swollen)  Medications:   sodium chloride   Intravenous Once   Chlorhexidine Gluconate Cloth  6 each Topical Daily   darbepoetin (ARANESP) injection - DIALYSIS  150 mcg Intravenous Q Sat-HD   doxercalciferol  2 mcg Intravenous Q T,Th,Sa-HD   feeding supplement (NEPRO  CARB STEADY)  237 mL Oral TID BM   multivitamin  1 tablet Oral QHS   pantoprazole  40 mg Oral BID   polyethylene glycol  17 g Oral Daily   sucralfate  1 g Oral TID WC & HS    OP HD:  [place] [facility]  3h 68min  450/1.5  65kg  2/2 bath  P4   L AVF  Hep none -Hectorol 2 mcg IV TIW -Mircera 150 mcg IV q 2 weeks (last 1/28  > due 2/11) -Venofer 100 mg IV weekly   Assessment/Plan: Recurrent GI bleed: sp EGD 1/21 that showed active bleeding from duodenal ulcer that could not be controlled, so pt sent to IR and had embolization of the GDA.  He was dc'd on 1/25. 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 Had further rebleeding here and went to OR 2/05 gen surg  w/ ex-lap, LOA and surgical ulcer repair.  Per GI/ surgery/ pmd. Now advancing diet.  ESRD: cont HD TTS. Next HD 2/18 Access: LUE AVF with swollen arm. Instructed him to keep his arm elevated. Uf'ing  as tolerated. Pt requested to have surgeon look at it. Access pressures have been WNL and running well on HD Anemia (ABLA + ESRD):  total 14U PRBCs + 2U FFP this admit. Surgical repair 2/05. Next esa due 2/11, have ordered darbe 150 q Sat 1st dose 2/11. Hgb 7.4 stable 2/13 MBD: Corr Ca ok.  Continue Renvela and VDRA. HTN/volume: UF as tolerated Nutrition: Albumin very low  - give protein supplements as tolerated. Advancing diet as tolerated.  Dispo: Per primary  Gean Quint, MD [facility] 12/01/2021,11:34 AM

## 2021-12-01 NOTE — Assessment & Plan Note (Addendum)
Patient has AV fistula on left upper extremity. Normal fistulogram on 2/17

## 2021-12-01 NOTE — Progress Notes (Signed)
Mobility Specialist: Progress Note   12/01/21 1414  Mobility  Activity Refused mobility   Pt refused mobility stating it hasn't been long since he returned from HD and wants to rest. Informed pt PT would be coming by later to work with him, pt receptive. Will f/u as able.  [facility] Yitzhak Awan Mobility Specialist Mobility Specialist 5 North: [phone] Mobility Specialist 6 North: [phone]

## 2021-12-01 NOTE — Progress Notes (Signed)
Physical Therapy Treatment Patient Details Name: Leonidus Rowand MRN: [id number] DOB: 1961-07-29 Today's Date: 12/01/2021   History of Present Illness 61 y/o male presented to ED on 11/12/21 after being found on floor with rectal bleeding. Multiple recent admissions for CHF volume overload, encephalopathy, pulmonary edema, and GI bleed. CTA showed large pericardial effusion and ascites. EGD on 1/30 demonstrated duodenal ulcer with overlying adherent clot but no signs of active bleeding. Pt underwent exploratory laparotomy and repair of bleeding duodenal ulcer on 2/5.  PMH: ESRD (HD TTS), colon cancer, CHF, polysubstance abuse    PT Comments    Pt was able to ambulate without a walker in the room but still required a walker and supervision assist to walk in the hallway. Pt was able to walk up the stairs with UE support with min guard from therapist and was out of breath after ascending one flight of stairs. Descending stairs seemed a little easier for the patient. During treatment session patient showed deficits in strength, endurance, activity tolerance. Changing recommendation for therapy services to home health rehab to address the previously stated deficits. With this recommendation change pt will require a minimum of one more session with acute rehab physical therapist for further training on stairs before he returns home. Will continue to follow acutely to maximize functional mobility, independence and safety.   Recommendations for follow up therapy are one component of a multi-disciplinary discharge planning process, led by the attending physician.  Recommendations may be updated based on patient status, additional functional criteria and insurance authorization.  Follow Up Recommendations  Home health PT     Assistance Recommended at Discharge Set up Supervision/Assistance  Patient can return home with the following Help with stairs or ramp for entrance;A little help with walking and/or  transfers;Assistance with cooking/housework;Assist for transportation   Equipment Recommendations  Rollator (4 wheels)    Recommendations for Other Services       Precautions / Restrictions Precautions Precautions: Fall Restrictions Weight Bearing Restrictions: No     Mobility  Bed Mobility Overal bed mobility: Modified Independent             General bed mobility comments: Modified independant, requires extra time.    Transfers Overall transfer level: Needs assistance Equipment used: Rolling walker (2 wheels) Transfers: Sit to/from Stand Sit to Stand: Modified independent (Device/Increase time)           General transfer comment: Pt transfered modified independent taking extra time to stand with UE to help initiate.    Ambulation/Gait Ambulation/Gait assistance: Supervision Gait Distance (Feet): 150 Feet Assistive device: Rolling walker (2 wheels) Gait Pattern/deviations: Step-through pattern, Decreased stride length, Trunk flexed Gait velocity: decreased     General Gait Details: heavy use of assistive divice.   Stairs Stairs: Yes Stairs assistance: Min guard Stair Management: One rail Right, Step to pattern, Forwards Number of Stairs: 12 General stair comments: Patient was able to climb stairs with right hand on railing and left hand on knee the last few steps. Pt was fatigued after ascending the stairs and required a standing break where he leaned on the railing. He was able to walk down the stairs with less fatigue. Patient required min guard on stairs for safety the whole time.   Wheelchair Mobility    Modified Rankin (Stroke Patients Only)       Balance Overall balance assessment: Needs assistance Sitting-balance support: No upper extremity supported, Feet supported Sitting balance-Leahy Scale: Good Sitting balance - Comments: Pt was independent with sitting  balance   Standing balance support: During functional activity, Bilateral upper  extremity supported, Reliant on assistive device for balance Standing balance-Leahy Scale: Fair Standing balance comment: Pt was able good standing balance in the room but requires UE support and up to min guard with funtional activities.                            Cognition Arousal/Alertness: Awake/alert Behavior During Therapy: WFL for tasks assessed/performed Overall Cognitive Status: Within Functional Limits for tasks assessed                                 General Comments: Pt was more agreeable to participating in therapy today        Exercises      General Comments        Pertinent Vitals/Pain Pain Assessment Pain Assessment: No/denies pain    Home Living                          Prior Function            PT Goals (current goals can now be found in the care plan section)      Frequency    Min 3X/week      PT Plan Discharge plan needs to be updated    Co-evaluation              AM-PAC PT "6 Clicks" Mobility   Outcome Measure  Help needed turning from your back to your side while in a flat bed without using bedrails?: None Help needed moving from lying on your back to sitting on the side of a flat bed without using bedrails?: None Help needed moving to and from a bed to a chair (including a wheelchair)?: None Help needed standing up from a chair using your arms (e.g., wheelchair or bedside chair)?: None Help needed to walk in hospital room?: A Little Help needed climbing 3-5 steps with a railing? : A Little 6 Click Score: 22    End of Session   Activity Tolerance: Patient tolerated treatment well Patient left: in bed;with call bell/phone within reach   PT Visit Diagnosis: Other abnormalities of gait and mobility (R26.89);Muscle weakness (generalized) (M62.81)     Time: 3785-8850 PT Time Calculation (min) (ACUTE ONLY): 20 min  Charges:  $Gait Training: 8-22 mins                     Quenton Fetter,  SPT    Quenton Fetter 12/01/2021, 5:29 PM

## 2021-12-01 NOTE — Assessment & Plan Note (Addendum)
Narcotic database reviewed.  Gave Rx for p.o. oxycodone 5 mg every 8 hours as needed for severe pain, #15

## 2021-12-01 NOTE — Consult Note (Addendum)
Hospital Consult    Reason for Consult:  Left upper extremity swelling with AVF Requesting Physician:  Dr. Candiss Norse MRN #:  [id number]  History of Present Illness: This is a 61 y.o. male with ESRD on HD with functioning left brachiocephalic AV fistula. Vascular surgery has been consulted secondary to left upper extremity swelling. He states the swelling started yesterday. He denies any bleeding, pain or steal symptoms. His fistula has been functioning well. He completed successful dialysis session this morning. He explains he is also having swelling in bilateral lower extremities and scrotum. His brachiocephalic AV fistula was created in September of 2018 by Dr. Bridgett Larsson.   Past Medical History:  Diagnosis Date   Anemia of chronic kidney failure    BPH (benign prostatic hyperplasia)    Colon cancer [facility]) 2014   spouse states he had surgury for colon CA   End stage renal disease on dialysis ([place]) 2017   Hypertension    Hypertensive heart disease with chronic diastolic congestive heart failure ([place]) 07/17/2016   Polysubstance abuse ([place])    History of heroin and marijuana use    Past Surgical History:  Procedure Laterality Date   ABDOMINAL SURGERY     AV FISTULA PLACEMENT Left 09/19/2016   Procedure: Left arm Radiocephalic ARTERIOVENOUS (AV) FISTULA CREATION;  Surgeon: Conrad [place], MD;  Location: [place];  Service: Vascular;  Laterality: Left;   [place] Left 07/09/2017   Procedure: BRACHIOCEPHALIC FISTULA CREATION;  Surgeon: Conrad Springhill, MD;  Location: [place];  Service: Vascular;  Laterality: Left;   COLON SURGERY  2014   ESOPHAGOGASTRODUODENOSCOPY (EGD) WITH PROPOFOL N/A 11/05/2021   Procedure: ESOPHAGOGASTRODUODENOSCOPY (EGD) WITH PROPOFOL;  Surgeon: Carol Ada, MD;  Location: [place];  Service: Endoscopy;  Laterality: N/A;   ESOPHAGOGASTRODUODENOSCOPY (EGD) WITH PROPOFOL N/A 11/14/2021   Procedure: ESOPHAGOGASTRODUODENOSCOPY (EGD) WITH PROPOFOL;  Surgeon: [name], MD;  Location: [place];  Service: Gastroenterology;  Laterality: N/A;   ESOPHAGOGASTRODUODENOSCOPY (EGD) WITH PROPOFOL N/A 11/16/2021   Procedure: ESOPHAGOGASTRODUODENOSCOPY (EGD) WITH PROPOFOL;  Surgeon: [name], MD;  Location: [place];  Service: Gastroenterology;  Laterality: N/A;   HEMOSTASIS CLIP PLACEMENT  11/16/2021   Procedure: HEMOSTASIS CLIP PLACEMENT;  Surgeon: [name], MD;  Location: [facility] ENDOSCOPY;  Service: Gastroenterology;;   HEMOSTASIS CONTROL  11/05/2021   Procedure: HEMOSTASIS CONTROL;  Surgeon: Carol Ada, MD;  Location: [place];  Service: Endoscopy;;   HEMOSTASIS CONTROL  11/16/2021   Procedure: HEMOSTASIS CONTROL;  Surgeon: [name], MD;  Location: [place];  Service: Gastroenterology;;   HOT HEMOSTASIS N/A 11/16/2021   Procedure: HOT HEMOSTASIS (ARGON PLASMA COAGULATION/BICAP);  Surgeon: [name], MD;  Location: [place];  Service: Gastroenterology;  Laterality: N/A;   INSERTION OF DIALYSIS CATHETER N/A 09/19/2016   Procedure: INSERTION OF TUNNELED DIALYSIS CATHETER;  Surgeon: Conrad Freeport, MD;  Location: [place];  Service: Vascular;  Laterality: N/A;   IR [place]  11/16/2021   IR ANGIOGRAM VISCERAL SELECTIVE  11/05/2021   IR ANGIOGRAM VISCERAL SELECTIVE  11/16/2021   IR ANGIOGRAM VISCERAL SELECTIVE  11/16/2021   IR EMBO ART  VEN HEMORR LYMPH EXTRAV  INC GUIDE ROADMAPPING  11/05/2021   IR EMBO ART  VEN HEMORR LYMPH EXTRAV  INC GUIDE ROADMAPPING  11/16/2021   IR GENERIC HISTORICAL  09/14/2016   IR US GUIDE VASC ACCESS RIGHT 09/14/2016 [name], DO [facility]   IR GENERIC HISTORICAL  09/14/2016   IR FLUORO GUIDE  CV LINE RIGHT 09/14/2016 [name], DO [facility]   IR PARACENTESIS  06/22/2021   IR US GUIDE VASC ACCESS RIGHT  11/05/2021   IR US GUIDE [place] RIGHT  11/16/2021   LAPAROTOMY N/A 05/23/2021   Procedure: EXPLORATORY LAPAROTOMY LYSIS ADHESIONS;  Surgeon: [name], MD;   Location: [place];  Service: General;  Laterality: N/A;   LAPAROTOMY N/A 11/20/2021   Procedure: EXPLORATORY LAPAROTOMY, REPAIR OF BLEEDING DUODENAL ULCER;  Surgeon: Dwan Bolt, MD;  Location: [place];  Service: General;  Laterality: N/A;   ORIF TIBIA PLATEAU Left 01/15/2018   Procedure: OPEN REDUCTION INTERNAL FIXATION (ORIF) TIBIAL PLATEAU;  Surgeon: Altamese Conway, MD;  Location: [place];  Service: Orthopedics;  Laterality: Left;   SCLEROTHERAPY  11/05/2021   Procedure: Clide Deutscher;  Surgeon: Carol Ada, MD;  Location: [place];  Service: Endoscopy;;   SCLEROTHERAPY  11/16/2021   Procedure: Clide Deutscher;  Surgeon: [name], MD;  Location: [facility] ENDOSCOPY;  Service: Gastroenterology;;   TRANSTHORACIC ECHOCARDIOGRAM  07/2016    EF 60-65%, No RWMA. Mod Concentric LVH - Gr 2 DD. Severe LA dilation. PAP ~35 mmHg (mild Pulm HTN)  --> no changes noted 1 month later    No Active Allergies  Prior to Admission medications   Medication Sig Start Date End Date Taking? Authorizing Provider  carvedilol (COREG) 6.25 MG tablet Take 1 tablet (6.25 mg total) by mouth 2 (two) times daily with a meal. 10/09/21  Yes Sharen Hones, MD  LIDOCAINE-PRILOCAINE EX Apply 1 application topically See admin instructions. Prior to dialysis Tuesday,Thursday and saturday   Yes [provider]  Oxycodone HCl 10 MG TABS Take 10 mg by mouth 2 (two) times daily as needed (pain). 10/26/21  Yes [provider]  pantoprazole (PROTONIX) 40 MG tablet Take 1 tablet (40 mg total) by mouth 2 (two) times daily. Patient not taking: Reported on 11/12/2021 11/09/21   Geradine Girt, DO    Social History   Socioeconomic History   Marital status: Married    Spouse name: Not on file   Number of children: Not on file   Years of education: Not on file   Highest education level: Not on file  Occupational History   Occupation: disabled  Tobacco Use   Smoking status: Some Days    Packs/day: 0.25    Types:  Cigarettes   Smokeless tobacco: Never  Vaping Use   Vaping Use: Never used  Substance and Sexual Activity   Alcohol use: No    Alcohol/week: 7.0 standard drinks    Types: 7 Cans of beer per week   Drug use: Yes    Frequency: 1.0 times per week    Types: Marijuana, Heroin    Comment: reports quitting heroin 15 years ago; can't afford daily marijuana   Sexual activity: Yes    Comment: once a week  Other Topics Concern   Not on file  Social History Narrative   Not on file   Social Determinants of Health   Financial Resource Strain: Not on file  Food Insecurity: Not on file  Transportation Needs: Not on file  Physical Activity: Not on file  Stress: Not on file  Social Connections: Not on file  Intimate Partner Violence: Not on file     Family History  Problem Relation Age of Onset   Heart failure Mother        Died at age 64.   Heart attack Mother 63   Hypertension Mother  Diabetes Mellitus II Mother    Other Father        Unknown   Kidney failure Sister        (Oldest Sister)   Other Other        Multiple siblings have started her heart disease, he is not sure of the details.   CAD Nephew     ROS: Otherwise negative unless mentioned in HPI  Physical Examination  Vitals:   12/01/21 1130 12/01/21 1201  BP: 133/76 138/86  Pulse: 89 90  Resp:  17  Temp:  98.6 F (37 C)  SpO2:  99%   Body mass index is 22.05 kg/m.  General:  WDWN in NAD; patient seen in HD unit Gait: Not observed HENT: WNL, normocephalic Pulmonary: normal non-labored breathing Cardiac: regular Extremities: without ischemic changes, without Gangrene , without cellulitis; without open wounds; left upper extremity is edematous. Fistula with good thrill, pulsatile. 2+ radial pulse. Left hand warm Musculoskeletal: no muscle wasting or atrophy  Neurologic: A&O X 3;  No focal weakness or paresthesias are detected; speech is fluent/normal Psychiatric:  The pt has Normal affect. Lymph:   Unremarkable  CBC    Component Value Date/Time   WBC 9.7 11/28/2021 0442   RBC 2.59 (L) 11/28/2021 0442   HGB 7.3 (L) 12/01/2021 0445   HCT 23.1 (L) 12/01/2021 0445   PLT 365 11/28/2021 0442   MCV 88.4 11/28/2021 0442   MCH 28.6 11/28/2021 0442   MCHC 32.3 11/28/2021 0442   RDW 17.2 (H) 11/28/2021 0442   LYMPHSABS 0.6 (L) 11/23/2021 0624   MONOABS 1.6 (H) 11/23/2021 0624   EOSABS 0.1 11/23/2021 0624   BASOSABS 0.0 11/23/2021 0624    BMET    Component Value Date/Time   NA 130 (L) 12/01/2021 0445   K 3.6 12/01/2021 0445   CL 96 (L) 12/01/2021 0445   CO2 24 12/01/2021 0445   GLUCOSE 83 12/01/2021 0445   BUN 51 (H) 12/01/2021 0445   CREATININE 6.99 (H) 12/01/2021 0445   CREATININE 1.59 (H) 07/29/2015 0943   CALCIUM 7.8 (L) 12/01/2021 0445   CALCIUM 8.4 (L) 09/14/2016 1615   GFRNONAA 8 (L) 12/01/2021 0445   GFRNONAA 48 (L) 07/29/2015 0943   GFRAA 7 (L) 05/18/2019 2220   GFRAA 56 (L) 07/29/2015 0943    COAGS: Lab Results  Component Value Date   INR 1.8 (H) 11/16/2021   INR 1.5 (H) 11/13/2021   INR 1.5 (H) 11/07/2021   ASSESSMENT/PLAN: This is a 61 y.o. male with ESRD on HD via left Brachiocephalic AV fistula now with left upper extremity swelling. The fistula is functioning well. The left upper arm is edematous throughout. On exam the left AV fistula has a good thrill but is very pulsatile. Recommend fistulogram for further evaluation of central venous stenosis. Will make patient NPO after midnight. Plan will be for left upper extremity fistulogram tomorrow with Dr. Susanne Borders PA-C [facility] [phone] 12/01/2021  12:10 PM  VASCULAR STAFF ADDENDUM: I have independently interviewed and examined the patient. I agree with the above.  Fistulagram tomorrow. NPO after midnight.  [name]. Stanford Breed, MD [facility] Phone Number: [phone] 12/01/2021 12:25 PM

## 2021-12-01 NOTE — Progress Notes (Signed)
OT Cancellation Note  Patient Details Name: Timothy [name] MRN: [id number] DOB: 1961/02/11   Cancelled Treatment:    Reason Eval/Treat Not Completed: Patient at procedure or test/ unavailable  Metta Clines 12/01/2021, 2:42 PM

## 2021-12-02 ENCOUNTER — Other Ambulatory Visit (HOSPITAL_COMMUNITY): Payer: Self-pay

## 2021-12-02 ENCOUNTER — Encounter (HOSPITAL_COMMUNITY): Payer: Self-pay | Admitting: Vascular Surgery

## 2021-12-02 ENCOUNTER — Inpatient Hospital Stay (HOSPITAL_COMMUNITY)
Admission: EM | Disposition: A | Discharge status: Home Health | Payer: Self-pay | Source: Home / Self Care | Attending: Internal Medicine

## 2021-12-02 DIAGNOSIS — L8990 Pressure ulcer of unspecified site, unspecified stage: Secondary | ICD-10-CM | POA: Insufficient documentation

## 2021-12-02 HISTORY — PX: A/V FISTULAGRAM: CATH118298

## 2021-12-02 LAB — HEMOGLOBIN AND HEMATOCRIT, BLOOD
HCT: 23.8 % — ABNORMAL LOW (ref 39.0–52.0)
Hemoglobin: 7.3 g/dL — ABNORMAL LOW (ref 13.0–17.0)

## 2021-12-02 SURGERY — A/V FISTULAGRAM
Anesthesia: LOCAL | Laterality: Left

## 2021-12-02 MED ORDER — CARVEDILOL 3.125 MG PO TABS
3.1250 mg | ORAL_TABLET | Freq: Two times a day (BID) | ORAL | 1 refills | Status: DC
  Filled 2021-12-02: qty 60, 30d supply, fill #0

## 2021-12-02 MED ORDER — GABAPENTIN 100 MG PO CAPS
100.0000 mg | ORAL_CAPSULE | Freq: Every day | ORAL | 1 refills | Status: DC
  Filled 2021-12-02: qty 30, 30d supply, fill #0

## 2021-12-02 MED ORDER — SUCRALFATE 1 G PO TABS
1.0000 g | ORAL_TABLET | Freq: Four times a day (QID) | ORAL | 0 refills | Status: DC
  Filled 2021-12-02: qty 120, 30d supply, fill #0

## 2021-12-02 MED ORDER — RENA-VITE PO TABS
1.0000 | ORAL_TABLET | Freq: Every day | ORAL | 1 refills | Status: DC
  Filled 2021-12-02: qty 30, 30d supply, fill #0

## 2021-12-02 MED ORDER — ACETAMINOPHEN 325 MG PO TABS: 650.0000 mg | ORAL_TABLET | Freq: Four times a day (QID) | ORAL | Status: DC | PRN

## 2021-12-02 MED ORDER — IODIXANOL 320 MG/ML IV SOLN
INTRAVENOUS | Status: DC | PRN
  Administered 2021-12-02: 20 mL

## 2021-12-02 MED ORDER — LIDOCAINE HCL (PF) 1 % IJ SOLN
INTRAMUSCULAR | Status: DC | PRN
  Administered 2021-12-02: 2 mL

## 2021-12-02 MED ORDER — LIDOCAINE HCL (PF) 1 % IJ SOLN
INTRAMUSCULAR | Status: AC
  Filled 2021-12-02: qty 30

## 2021-12-02 MED ORDER — HEPARIN (PORCINE) IN NACL 1000-0.9 UT/500ML-% IV SOLN
INTRAVENOUS | Status: AC
Start: 2021-12-02 — End: ?
  Filled 2021-12-02: qty 1000

## 2021-12-02 MED ORDER — OXYCODONE HCL 5 MG PO TABS
5.0000 mg | ORAL_TABLET | Freq: Three times a day (TID) | ORAL | 0 refills | Status: AC | PRN
  Filled 2021-12-02: qty 15, 5d supply, fill #0

## 2021-12-02 MED ORDER — POLYETHYLENE GLYCOL 3350 17 GM/SCOOP PO POWD
17.0000 g | Freq: Two times a day (BID) | ORAL | 0 refills | Status: DC | PRN
  Filled 2021-12-02: qty 238, 7d supply, fill #0

## 2021-12-02 MED ORDER — BISACODYL 10 MG RE SUPP
10.0000 mg | Freq: Every day | RECTAL | 0 refills | Status: DC | PRN
  Filled 2021-12-02: qty 12, 12d supply, fill #0

## 2021-12-02 MED ORDER — PANTOPRAZOLE SODIUM 40 MG PO TBEC
40.0000 mg | DELAYED_RELEASE_TABLET | Freq: Two times a day (BID) | ORAL | 0 refills | Status: DC
  Filled 2021-12-02: qty 60, 30d supply, fill #0

## 2021-12-02 SURGICAL SUPPLY — 10 items

## 2021-12-02 NOTE — Progress Notes (Signed)
[facility] [place] PROGRESS NOTE  ASSESSMENT / PLAN: Timothy [name] is a 61 y.o. male with left arm swelling; ipsilateral brachiocephalic arteriovenous fistula. Plan left arm fistulagram today.  SUBJECTIVE: No complaints. Reviewed plan for cath lab.  OBJECTIVE: BP (!) 145/84 (BP Location: Right Arm)    Pulse 91    Temp 97.8 F (36.6 C) (Oral)    Resp 18    Ht 6\' 1"  (1.854 m)    Wt 75.8 kg    SpO2 97%    BMI 22.05 kg/m   Intake/Output Summary (Last 24 hours) at 12/02/2021 1052 Last data filed at 12/01/2021 1639 Gross per 24 hour  Intake 827 ml  Output 3279 ml  Net -2452 ml    No distress Left BC AVF with pulsatile thrill  CBC Latest Ref Rng & Units 12/02/2021 12/01/2021 11/28/2021  WBC 4.0 - 10.5 K/uL - - 9.7  Hemoglobin 13.0 - 17.0 g/dL 7.3(L) 7.3(L) 7.4(L)  Hematocrit 39.0 - 52.0 % 23.8(L) 23.1(L) 22.9(L)  Platelets 150 - 400 K/uL - - 365     CMP Latest Ref Rng & Units 12/01/2021 11/27/2021 11/25/2021  Glucose 70 - 99 mg/dL 83 101(H) 107(H)  BUN 6 - 20 mg/dL 51(H) 24(H) 20  Creatinine 0.61 - 1.24 mg/dL 6.99(H) 4.80(H) 3.64(H)  Sodium 135 - 145 mmol/L 130(L) 132(L) 128(L)  Potassium 3.5 - 5.1 mmol/L 3.6 3.3(L) 3.3(L)  Chloride 98 - 111 mmol/L 96(L) 96(L) 94(L)  CO2 22 - 32 mmol/L 24 26 24   Calcium 8.9 - 10.3 mg/dL 7.8(L) 7.4(L) 7.5(L)  Total Protein 6.5 - 8.1 g/dL - - -  Total Bilirubin 0.3 - 1.2 mg/dL - - -  Alkaline Phos 38 - 126 U/L - - -  AST 15 - 41 U/L - - -  ALT 0 - 44 U/L - - -    Estimated Creatinine Clearance: 12 mL/min (A) (by C-G formula based on SCr of 6.99 mg/dL (H)).  [name]. Stanford Breed, MD [facility] Phone Number: [phone] 12/02/2021 10:52 AM

## 2021-12-02 NOTE — Progress Notes (Signed)
Physical Therapy Treatment Patient Details Name: Timothy [name] MRN: [id number] DOB: September 11, 1961 Today's Date: 12/02/2021   History of Present Illness 61 y/o male presented to ED on 11/12/21 after being found on floor with rectal bleeding. Multiple recent admissions for CHF volume overload, encephalopathy, pulmonary edema, and GI bleed. CTA showed large pericardial effusion and ascites. EGD on 1/30 demonstrated duodenal ulcer with overlying adherent clot but no signs of active bleeding. Pt underwent exploratory laparotomy and repair of bleeding duodenal ulcer on 2/5.  PMH: ESRD (HD TTS), colon cancer, CHF, polysubstance abuse    PT Comments    Possible plan to discharge to home today. Pt had improved activity tolerance with ambulation with Rolator and stairs today as seen by his decreased signs of fatigue and sorter break taken before descending the stairs. He used Rolator appropriately without cuing. During treatment session patient showed deficits in strength, endurance, activity tolerance. Recommending therapy services at home health services to address the previously stated deficits. Pt was educated on what to expect with home health PT.      Recommendations for follow up therapy are one component of a multi-disciplinary discharge planning process, led by the attending physician.  Recommendations may be updated based on patient status, additional functional criteria and insurance authorization.  Follow Up Recommendations  Home health PT     Assistance Recommended at Discharge Set up Supervision/Assistance  Patient can return home with the following Help with stairs or ramp for entrance;Assistance with cooking/housework;Assist for transportation   Equipment Recommendations       Recommendations for Other Services       Precautions / Restrictions Restrictions Weight Bearing Restrictions: No     Mobility  Bed Mobility               General bed mobility comments: Pt was  sitting EOB at the start of the session.    Transfers Overall transfer level: Modified independent Equipment used: Rollator (4 wheels) Transfers: Sit to/from Stand Sit to Stand: Modified independent (Device/Increase time)           General transfer comment: Pt transfered modified independent taking extra time to stand with UE to help initiate.    Ambulation/Gait Ambulation/Gait assistance: Modified independent (Device/Increase time) Gait Distance (Feet): 150 Feet Assistive device: Rollator (4 wheels) Gait Pattern/deviations: Step-through pattern, Decreased stride length, Trunk flexed Gait velocity: decreased     General Gait Details: Pt was modified independent with Rolator for ambulation. He was able to safely use the Rolator without cuing.   Stairs Stairs: Yes Stairs assistance: Min guard Stair Management: One rail Right, Step to pattern, Forwards Number of Stairs: 12 General stair comments: Patient was able to climb stairs with right hand on railing and left hand on knee and min gurad with no loss of balance. Pt was fatigued after ascending the stairs and required a standing break where he leaned on the wall for a minute. He was able to walk down the stairs with less fatigue. Patient required supervision descending stairs for safety.   Wheelchair Mobility    Modified Rankin (Stroke Patients Only)       Balance Overall balance assessment: Needs assistance Sitting-balance support: No upper extremity supported, Feet supported Sitting balance-Leahy Scale: Good Sitting balance - Comments: Patient was able to put on his pants and shirt in sitting independantly     Standing balance-Leahy Scale: Good Standing balance comment: Pt was able good standing balance in the room and used a rolator to ambulate outside the room.  Cognition Arousal/Alertness: Awake/alert Behavior During Therapy: WFL for tasks assessed/performed Overall  Cognitive Status: Within Functional Limits for tasks assessed                                 General Comments: Pt was more agreeable to participating in therapy today        Exercises      General Comments General comments (skin integrity, edema, etc.): Rollator was adjusted to fit pt's height but was still a little low. Attempt was made to adjust the height again and pt refused because it was uncomfortable on his incision. He was cued the stand up straighter and he refused for the same reason.      Pertinent Vitals/Pain Pain Assessment Pain Assessment: No/denies pain    Home Living                          Prior Function            PT Goals (current goals can now be found in the care plan section)      Frequency    Min 3X/week      PT Plan Current plan remains appropriate    Co-evaluation              AM-PAC PT "6 Clicks" Mobility   Outcome Measure  Help needed turning from your back to your side while in a flat bed without using bedrails?: None Help needed moving from lying on your back to sitting on the side of a flat bed without using bedrails?: None Help needed moving to and from a bed to a chair (including a wheelchair)?: None Help needed standing up from a chair using your arms (e.g., wheelchair or bedside chair)?: None Help needed to walk in hospital room?: None Help needed climbing 3-5 steps with a railing? : A Little 6 Click Score: 23    End of Session   Activity Tolerance: Patient tolerated treatment well Patient left: in chair;with call bell/phone within reach   PT Visit Diagnosis: Other abnormalities of gait and mobility (R26.89);Muscle weakness (generalized) (M62.81)     Time: 4818-5631 PT Time Calculation (min) (ACUTE ONLY): 13 min  Charges:  $Gait Training: 8-22 mins                     Quenton Fetter, SPT     Quenton Fetter 12/02/2021, 1:23 PM

## 2021-12-02 NOTE — Progress Notes (Signed)
OT Cancellation Note  Patient Details Name: Timothy [name] MRN: [id number] DOB: 1960/10/25   Cancelled Treatment:    Reason Eval/Treat Not Completed: Patient stating he is discharging home.    Shannin Naab D Troy Hartzog 12/02/2021, 4:21 PM

## 2021-12-02 NOTE — Progress Notes (Signed)
Patient is  POD 12 s/p exploratory laparotomy, LOA, transduodenal suture ligation of duodenal ulcer 11/20/21 Dr. Zenia Resides - staples removed at bedside and steri-strips applied, pt tolerated well - he reports he is tolerating PO and having bowel function  - he is taking primarily oxycodone as needed for pain - mobilizing well - post-op follow up in [place], [facility] 12/02/2021, 9:36 AM Please see Amion for pager number during day hours 7:00am-4:30pm

## 2021-12-02 NOTE — Care Management Important Message (Signed)
Important Message  Patient Details  Name: Sten Dematteo MRN: [id number] Date of Birth: [birth year]   Medicare Important Message Given:  Yes     Hannah Beat 12/02/2021, 12:13 PM

## 2021-12-02 NOTE — Op Note (Addendum)
DATE OF SERVICE: 12/02/2021  PATIENT:  Timothy [name]  61 y.o. male  PRE-OPERATIVE DIAGNOSIS: End-stage renal disease, left upper extremity brachiocephalic arteriovenous fistula.  Left upper extremity swelling  POST-OPERATIVE DIAGNOSIS:  Same  PROCEDURE:   Left upper extremity fistulogram  SURGEON:  Surgeon(s) and Role:    * Cherre Robins, MD - Primary  ASSISTANT: none  An assistant was required to facilitate exposure and expedite the case.  ANESTHESIA:   local  EBL: minimal  BLOOD ADMINISTERED:none  DRAINS: none   LOCAL MEDICATIONS USED:  LIDOCAINE   SPECIMEN: None  COUNTS: confirmed correct .  TOURNIQUET:  none  PATIENT DISPOSITION:  PACU - hemodynamically stable.   Delay start of Pharmacological VTE agent (>24hrs) due to surgical blood loss or risk of bleeding: no  INDICATION FOR PROCEDURE: Timothy [name] is a 61 y.o. male with left arm swelling with ipsilateral brachiocephalic arteriovenous fistula. After careful discussion of risks, benefits, and alternatives the patient was offered fistulogram. The patient understood and wished to proceed.  OPERATIVE FINDINGS: Unremarkable fistulogram.  The fistula is a bit tortuous as it courses to the upper extremity.  There is robust collateralization about the shoulder, but no evidence of central stenosis.  DESCRIPTION OF PROCEDURE: After identification of the patient in the pre-operative holding area, the patient was transferred to the operating room. The patient was positioned supine on the operating room table. Anesthesia was induced. The left arm was prepped and draped in standard fashion. A surgical pause was performed confirming correct patient, procedure, and operative location.  The left upper extremity brachiocephalic arteriovenous fistula was accessed with micropuncture technique.  The micro sheath was advanced into the fistula.  Fistulography was performed in stations.  There is no evidence of central venous  stenosis.  There is no stenosis of the cephalic arch.  The cephalic vein fistula in the upper arm was tortuous but widely patent.  Satisfied we ended the case here.  The sheath was withdrawn.  A figure-of-eight stitch of Monocryl was placed around the access.  Hemostasis was achieved.  Sterile bandage was applied.  Upon completion of the case instrument and sharps counts were confirmed correct. The patient was transferred to the PACU in good condition. I was present for all portions of the procedure.  [name]. Stanford Breed, MD [facility] Phone Number: [phone] 12/02/2021 10:53 AM

## 2021-12-02 NOTE — Progress Notes (Signed)
[facility] Progress Note   Subjective: Seen and examined in room. No acute events. Net uf 3729cc. Still with LUE swelling. Open for fistulogram today, currently NPO.  Objective Vitals:   12/01/21 1922 12/01/21 2350 12/02/21 0318 12/02/21 0807  BP: 135/71 (!) 141/85 (!) 151/98 (!) 145/84  Pulse: 86 95 88 91  Resp: 16 17 18 18   Temp: 98.1 F (36.7 C) 97.9 F (36.6 C) (!) 97.5 F (36.4 C) 97.8 F (36.6 C)  TempSrc: Oral Oral Oral Oral  SpO2:  96%  97%  Weight:      Height:          Additional Objective Labs: Basic Metabolic Panel: Recent Labs  Lab 11/27/21 0353 12/01/21 0445  NA 132* 130*  K 3.3* 3.6  CL 96* 96*  CO2 26 24  GLUCOSE 101* 83  BUN 24* 51*  CREATININE 4.80* 6.99*  CALCIUM 7.4* 7.8*  PHOS  --  1.9*   CBC: Recent Labs  Lab 11/27/21 0353 11/28/21 0442 12/01/21 0445 12/02/21 0054  WBC 7.5 9.7  --   --   HGB 7.2* 7.4* 7.3* 7.3*  HCT 22.7* 22.9* 23.1* 23.8*  MCV 87.6 88.4  --   --   PLT 371 365  --   --    Blood Culture    Component Value Date/Time   SDES PERITONEAL FLUID 06/22/2021 1236   SPECREQUEST ABDOMEN 06/22/2021 1236   CULT  06/22/2021 1236    NO GROWTH 3 DAYS Performed at [facility], [place] [address], [place], [place] [address]    REPTSTATUS 06/26/2021 FINAL 06/22/2021 1236     Physical Exam BP (!) 145/84 (BP Location: Right Arm)    Pulse 91    Temp 97.8 F (36.6 C) (Oral)    Resp 18    Ht 6\' 1"  (1.854 m)    Wt 75.8 kg    SpO2 97%    BMI 22.05 kg/m   General: Lying in bed, nontoxic appearing, nad  Heart: RRR No n,r,g  Lungs: Clear bilaterally  Abdomen: soft non-tender Extremities: b/l LE edema Neuro: awake, alert Dialysis Access: LUE AVF +b/t (LUE swollen)  Medications:   sodium chloride   Intravenous Once   carvedilol  3.125 mg Oral BID WC   Chlorhexidine Gluconate Cloth  6 each Topical Daily   darbepoetin (ARANESP) injection - DIALYSIS  150 mcg Intravenous Q Sat-HD   doxercalciferol  2 mcg  Intravenous Q T,Th,Sa-HD   feeding supplement (NEPRO CARB STEADY)  237 mL Oral TID BM   gabapentin  100 mg Oral QHS   multivitamin  1 tablet Oral QHS   pantoprazole  40 mg Oral BID   polyethylene glycol  17 g Oral Daily   sucralfate  1 g Oral TID WC & HS    OP HD:  [facility]  3h 54min  450/1.5  65kg  2/2 bath  P4   L AVF  Hep none -Hectorol 2 mcg IV TIW -Mircera 150 mcg IV q 2 weeks (last 1/28  > due 2/11) -Venofer 100 mg IV weekly   Assessment/Plan: Recurrent GI bleed: sp EGD 1/21 that showed active bleeding from duodenal ulcer that could not be controlled, so pt sent to IR and had embolization of the GDA.  He was dc'd on 1/25. 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 Had further rebleeding here and went to OR  2/05 gen surg  w/ ex-lap, LOA and surgical ulcer repair.  Per GI/ surgery/ pmd. ESRD: cont HD TTS. Next HD 2/18 Access: LUE AVF with swollen arm. Instructed him to keep his arm elevated. Uf'ing as tolerated. Access pressures have been WNL and running well on HD. VVS following-open for fgram today Anemia (ABLA + ESRD):  total 14U PRBCs + 2U FFP this admit. Surgical repair 2/05. Next esa due 2/11, have ordered darbe 150 q Sat 1st dose 2/11. Hgb 7.3 2/17 MBD: Corr Ca ok.  Continue Renvela and VDRA. HTN/volume: UF as tolerated Nutrition: Albumin very low  - give protein supplements as tolerated. Dispo: Per primary  Gean Quint, MD [facility] 12/02/2021,10:21 AM

## 2021-12-02 NOTE — Discharge Summary (Signed)
Physician Discharge Summary   Patient: Timothy [name] MRN: [id number] DOB: August 29, 1961  Admit date:     11/12/2021  Discharge date: 12/02/21  Discharge Physician: Mercy Riding   PCP: [name], MD   Recommendations at discharge:   Recheck CBC in 1 week Outpatient follow-up with PCP in 1 to 2 weeks or sooner if needed Outpatient follow-up with general surgery as below  Discharge Diagnoses: Principal Problem:   Acute blood loss anemia due to bleeding duodenal ulcer Active Problems:   Hemorrhagic shock (HCC)   ESRD on HD TTS   Bilateral lower extremity pain   Paroxysmal A-fib (HCC)   Left upper extremity swelling   Protein-calorie malnutrition, severe   Acute postoperative pain of abdomen   Chronic combined systolic and diastolic CHF (congestive heart failure) (HCC)   Pressure injury of skin  Resolved Problems:   GI bleed   Duodenal ulcer with hemorrhage   Rectal bleeding   Hospital Course: 61 year old gentleman with history of ESRD TTS schedule, GERD with duodenal ulcer, hypertension, chronic pain on oxycodone who was brought in by EMS after he was found to have large rectal bleeding with acute blood loss related anemia, he was found to have a hemoglobin of 5.1 in the ER and admitted to ICU.  He required multiple units of packed RBC transfusion, he was seen by GI underwent EGD on 11/16/2021 showing a duodenal ulcer with spurting blood and adherent clot, IR was consulted and he subsequently underwent embolization on 11/16/2021.  Patient was stabilized and transferred to [facility] hospitalist service on 11/19/2021.  However, he had a massive bloody bowel movements the night of 2/4 requiring emergent laparotomy with bleeding duodenal ulcer repair on 2/5.  Eventually, bleeding since stopped.  Hemoglobin stabilized.  He was advanced to soft diet.  General surgery signed off but returned to remove staples on 12/02/2021 prior to discharge.  He is discharged on p.o. Protonix and Carafate.   Aspirin discontinued.  Advised to avoid NSAIDs.  Hospital course also complicated by LUE swelling.  He had a normal fistulogram on 12/02/2020.  Therapy initially recommended SNF but patient's physical condition improved.  The recommendation has changed to home health with DME.   On the day of discharge, cleared for discharge by surgery, nephrology, vascular surgery and GI.   Assessment and Plan: * Acute blood loss anemia due to bleeding duodenal ulcer- (present on admission) Recent Labs    11/20/21 1639 11/21/21 0045 11/22/21 0845 11/23/21 6599 11/24/21 0356 11/25/21 0249 11/27/21 0353 11/28/21 0442 12/01/21 0445 12/02/21 0054  HGB 9.6* 9.4* 7.9* 7.6* 8.0* 8.9* 7.2* 7.4* 7.3* 7.3*  -S/p multiple blood transfusions in ICU. -EGD revealed bleeding duodenal ulcer.  -Underwent IR embolization on 2/1. -Emergent laparotomy duodenal ulcer repair on 2/5 after massive bloody stool -Upper GI series negative for leaks.  Staples removed on 2/17. -Tolerating soft diet. -H&H relatively stable.  -EPO and IV iron per nephrology. -Recheck CBC in 1 week  Hemorrhagic shock ([place]) See ABLA  ESRD on HD TTS -HD per nephrology.  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 Normal fistulogram on 2/17  Paroxysmal A-fib (HCC) CHA2DS2-VASc score 2.  Poor candidate for anticoagulation given GI bleed. -Resume Coreg at a lower dose  Bilateral lower extremity pain ABI normal. Could be neuropathic -Already on oxycodone. -Started low-dose gabapentin  Chronic combined systolic and diastolic CHF (congestive heart failure) ([place])- (present on admission) Appears euvolemic except for LUE swelling and bilateral pedal swelling. -Fluid management  with dialysis  Acute postoperative pain of abdomen- (present on admission) Narcotic database reviewed.  Gave Rx for p.o. oxycodone 5 mg every 8 hours as needed for severe pain, #15  Protein-calorie malnutrition, severe- (present  on admission) As evidenced by significant muscle mass and subcu fat loss and low BMI Nutrition Problem: Severe Malnutrition Etiology: chronic illness (CHF, polysubstance abuse, ESRD) Signs/Symptoms: severe fat depletion, severe muscle depletion Interventions: MVI, Nepro shake         Pain control - [place] Controlled Substance Reporting System database was reviewed. and patient was instructed, not to drive, operate heavy machinery, perform activities at heights, swimming or participation in water activities or provide baby-sitting services while on Pain, Sleep and Anxiety Medications; until their outpatient Physician has advised to do so again. Also recommended to not to take more than prescribed Pain, Sleep and Anxiety Medications.   Consultants: Pulmonology, gastroenterology, IR, general surgery, nephrology and vascular surgery Procedures performed: See hospital course above Disposition: Home Diet recommendation:  Discharge Diet Orders (From admission, onward)     Start     Ordered   12/02/21 0000  Diet - low sodium heart healthy        12/02/21 1138           Renal diet  DISCHARGE MEDICATION: Allergies as of 12/02/2021   No Active Allergies      Medication List     TAKE these medications    acetaminophen 325 MG tablet Commonly known as: TYLENOL Take 2 tablets (650 mg total) by mouth every 6 (six) hours as needed for mild pain or moderate pain.   bisacodyl 10 MG suppository Commonly known as: DULCOLAX Place 1 suppository (10 mg total) rectally daily as needed for moderate constipation.   carvedilol 3.125 MG tablet Commonly known as: COREG Take 1 tablet (3.125 mg total) by mouth 2 (two) times daily with a meal. What changed:  medication strength how much to take   gabapentin 100 MG capsule Commonly known as: Neurontin Take 1 capsule (100 mg total) by mouth daily.   LIDOCAINE-PRILOCAINE EX Apply 1 application topically See admin instructions. Prior  to dialysis Tuesday,Thursday and saturday   multivitamin Tabs tablet Take 1 tablet by mouth at bedtime.   oxyCODONE 5 MG immediate release tablet Commonly known as: Oxy IR/ROXICODONE Take 1 tablet (5 mg total) by mouth every 8 (eight) hours as needed for up to 5 days for severe pain. What changed:  medication strength how much to take when to take this reasons to take this   pantoprazole 40 MG tablet Commonly known as: PROTONIX Take 1 tablet (40 mg total) by mouth 2 (two) times daily.   polyethylene glycol powder 17 GM/SCOOP powder Commonly known as: MiraLax Take 17 g by mouth 2 (two) times daily as needed for moderate constipation.   sucralfate 1 g tablet Commonly known as: Carafate Take 1 tablet (1 g total) by mouth 4 (four) times daily.               Durable Medical Equipment  (From admission, onward)           Start     Ordered   12/02/21 1130  For home use only DME 4 wheeled rolling walker with seat  Once       Question:  Patient needs a walker to treat with the following condition  Answer:  Generalized weakness   12/02/21 1130   12/02/21 1037  For home use only DME 4 wheeled rolling  walker with seat  Once       Question:  Patient needs a walker to treat with the following condition  Answer:  Weakness   12/02/21 1036            Follow-up Information     Dwan Bolt, MD. Go on 12/14/2021.   Specialty: General Surgery Why: at 3:00 pm for post-opreative follow up. please arrive 30 minutes early. Contact information: [address] [place] [address] [phone]         [name], MD. Schedule an appointment as soon as possible for a visit in 1 week(s).   Specialty: Internal Medicine Contact information: [address] [place] [address] [phone]         [facility] Follow up.   Why: [facility] home health has been set up with [facility]. Your start of service can not be started until  Tuesday Dec 06, 2021. The office will call you with start of service information. If you have any questions please call the number listed above. Contact information: [address] [place], [place] [address] [phone]                Discharge Exam: Filed Weights   12/01/21 0412 12/01/21 0838 12/01/21 1201  Weight: 85.2 kg 79 kg 75.8 kg   Vitals:   12/02/21 1042 12/02/21 1046 12/02/21 1051 12/02/21 1109  BP: (!) 144/99 (!) 141/97 (!) 133/93 (!) 128/97  Pulse: 84 84 83 81  Resp:  (!) 22 (!) 22 18  Temp:    97.7 F (36.5 C)  TempSrc:    Oral  SpO2: 100% 100% 100% 98%  Weight:      Height:         Condition at discharge: fair  The results of significant diagnostics from this hospitalization (including imaging, microbiology, ancillary and laboratory) are listed below for reference.   Imaging Studies: CT ABDOMEN PELVIS WO CONTRAST  Result Date: 11/05/2021 CLINICAL DATA:  Status post trauma. EXAM: CT ABDOMEN AND PELVIS WITHOUT CONTRAST TECHNIQUE: Multidetector CT imaging of the abdomen and pelvis was performed following the standard protocol without IV contrast. RADIATION DOSE REDUCTION: This exam was performed according to the departmental dose-optimization program which includes automated exposure control, adjustment of the mA and/or kV according to patient size and/or use of iterative reconstruction technique. COMPARISON:  June 25, 2021 FINDINGS: Lower chest: There is mild cardiomegaly with a large (approximately 1.5 cm) pericardial effusion. This represents a new finding. Diffusely increased attenuation of the myocardium is noted. Mild to moderate severity areas of atelectasis are seen within the bilateral lower lobes. Hepatobiliary: Diffusely increased attenuation of the liver parenchyma is noted no focal liver abnormality is seen. Numerous gallstones are seen within a partially contracted gallbladder, without evidence of gallbladder wall thickening or biliary  dilatation. Pancreas: There is mild pancreatic ductal dilatation (approximately 2 mm), without evidence of surrounding inflammation. Spleen: Normal in size without focal abnormality. Adrenals/Urinary Tract: Adrenal glands are unremarkable. Innumerable bilateral renal cysts are seen without obstructing renal calculi or hydronephrosis. The urinary bladder is empty and subsequently limited in evaluation. Stomach/Bowel: Mild to moderate severity diffuse thickening of the distal esophageal wall is seen. A large amount of ingested material is seen throughout the body of the stomach. Surgically anastomosed bowel is seen within the right anterior aspect of the right upper quadrant and medial aspect of the lower right abdomen, consistent with prior right hemicolectomy. No evidence of bowel  dilatation. Vascular/Lymphatic: Aortic atherosclerosis. No enlarged abdominal or pelvic lymph nodes. Reproductive: Prostate is unremarkable. Other: No abdominal wall hernia or abnormality. No abdominopelvic ascites. Musculoskeletal: No acute or significant osseous findings. IMPRESSION: 1. Mild cardiomegaly with a large (approximately 1.5 cm) pericardial effusion. This represents a new finding. 2. Diffusely increased attenuation of the myocardium and liver, consistent with hemochromatosis. 3. Cholelithiasis. 4. Innumerable bilateral renal cysts. 5. Mild to moderate severity diffuse thickening of the distal esophageal wall, which may represent sequelae associated with esophagitis. 6. Findings consistent with prior right hemicolectomy. 7. Aortic atherosclerosis. Aortic Atherosclerosis (ICD10-I70.0). Electronically Signed   By: Virgina Norfolk M.D.   On: 11/05/2021 01:23   DG Abd 1 View  Result Date: 11/17/2021 CLINICAL DATA:  61 year old male status post mesenteric angiogram and Gel-Foam embolization for GI bleeding. EXAM: ABDOMEN - 1 VIEW COMPARISON:  CT Abdomen and Pelvis 11/15/2021 and earlier. FINDINGS: Portable AP supine view at  0538 hours. Embolization coils now at the gastroduodenal artery level. Multiple other abdominal surgical clips mostly on the right. Nonobstructed bowel-gas pattern. No definite pneumoperitoneum on this supine view. Trace retained oral contrast suspected at the gastric cardia. Gray hazy opacity in the abdomen in keeping with ascites by CT. Negative lung bases. No acute osseous abnormality identified. IMPRESSION: 1. Nonobstructed bowel-gas pattern. 2. Ascites. 3. Recent gastroduodenal artery coil embolization. Electronically Signed   By: Genevie Ann M.D.   On: 11/17/2021 06:21   CT HEAD WO CONTRAST (5MM)  Result Date: 11/05/2021 CLINICAL DATA:  Fall, head and neck trauma. EXAM: CT HEAD WITHOUT CONTRAST CT CERVICAL SPINE WITHOUT CONTRAST TECHNIQUE: Multidetector CT imaging of the head and cervical spine was performed following the standard protocol without intravenous contrast. Multiplanar CT image reconstructions of the cervical spine were also generated. RADIATION DOSE REDUCTION: This exam was performed according to the departmental dose-optimization program which includes automated exposure control, adjustment of the mA and/or kV according to patient size and/or use of iterative reconstruction technique. COMPARISON:  None. FINDINGS: CT HEAD FINDINGS Brain: No acute hemorrhage, midline shift or mass effect. No extra-axial fluid collection. Mild cerebral atrophy is noted. Vascular: No hyperdense vessel or unexpected calcification. Skull: Normal. Negative for fracture or focal lesion. Sinuses/Orbits: No acute finding. Other: None. CT CERVICAL SPINE FINDINGS Alignment: There is mild retrolisthesis at C4-C5. Skull base and vertebrae: No acute fracture. No primary bone lesion or focal pathologic process. Soft tissues and spinal canal: No prevertebral fluid or swelling. No visible canal hematoma. Disc levels: Intervertebral disc space narrowing and degenerative endplate changes are seen predominantly at C4-C5 and C5-C6  resulting in moderate to severe spinal canal and neural foraminal stenosis. Upper chest: Negative. Other: Atherosclerotic calcification of the carotid arteries. IMPRESSION: 1. No acute intracranial process. 2. No acute fracture in the cervical spine. 3. 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 Electronically Signed   By: Brett Fairy M.D.   On: 11/05/2021 01:21   CT Cervical Spine Wo Contrast  Result Date: 11/05/2021 CLINICAL DATA:  Fall, head and neck trauma. EXAM: CT HEAD WITHOUT CONTRAST CT CERVICAL SPINE WITHOUT CONTRAST TECHNIQUE: Multidetector CT imaging of the head and cervical spine was performed following the standard protocol without intravenous contrast. Multiplanar CT image reconstructions of the cervical spine were also generated. RADIATION DOSE REDUCTION: This exam was performed according to the departmental dose-optimization program which includes automated exposure control, adjustment of the mA and/or kV according to patient size and/or use of iterative reconstruction technique. COMPARISON:  None. FINDINGS: CT HEAD  FINDINGS Brain: No acute hemorrhage, midline shift or mass effect. No extra-axial fluid collection. Mild cerebral atrophy is noted. Vascular: No hyperdense vessel or unexpected calcification. Skull: Normal. Negative for fracture or focal lesion. Sinuses/Orbits: No acute finding. Other: None. CT CERVICAL SPINE FINDINGS Alignment: There is mild retrolisthesis at C4-C5. Skull base and vertebrae: No acute fracture. No primary bone lesion or focal pathologic process. Soft tissues and spinal canal: No prevertebral fluid or swelling. No visible canal hematoma. Disc levels: Intervertebral disc space narrowing and degenerative endplate changes are seen predominantly at C4-C5 and C5-C6 resulting in moderate to severe spinal canal and neural foraminal stenosis. Upper chest: Negative. Other: Atherosclerotic calcification of the carotid arteries. IMPRESSION: 1. No acute  intracranial process. 2. No acute fracture in the cervical spine. 3. 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 Electronically Signed   By: Brett Fairy M.D.   On: 11/05/2021 01:21   IR Angiogram Visceral Selective  Result Date: 11/16/2021 INDICATION: 61 year old male with history of recent gastrointestinal hemorrhage status post gastroduodenal artery coil embolization on 11/05/2021, currently admitted for rebleeding episode with EGD demonstrating hemorrhage from the duodenum. Endoscopic intervention was unsuccessful. EXAM: 1. Ultrasound-guided vascular access of the right common femoral artery. 2. Selective catheterization angiography of the celiac artery and superior mesenteric artery. 3. Sub selective catheterization and angiography of anterior and posterior pancreaticoduodenal branches. 4. Gel-Foam embolization of anterior and posterior pancreaticoduodenal branches. MEDICATIONS: None. ANESTHESIA/SEDATION: Moderate (conscious) sedation was employed during this procedure. A total of Versed 1.5 mg and Fentanyl 75 mcg was administered intravenously. Moderate Sedation Time: 110 minutes. The patient's level of consciousness and vital signs were monitored continuously by radiology nursing throughout the procedure under my direct supervision. CONTRAST:  58mL OMNIPAQUE IOHEXOL 300 MG/ML SOLN, 37mL OMNIPAQUE IOHEXOL 300 MG/ML SOLN, 63mL OMNIPAQUE IOHEXOL 300 MG/ML SOLN FLUOROSCOPY: 34 minutes, 42 seconds. Radiation Exposure Index (as provided by the fluoroscopic device): 8,937 mGy Kerma COMPLICATIONS: None immediate. PROCEDURE: Informed consent was obtained from the patient following explanation of the procedure, risks, benefits and alternatives. The patient understands, agrees and consents for the procedure. All questions were addressed. A time out was performed prior to the initiation of the procedure. Maximal barrier sterile technique utilized including caps, mask, sterile gowns, sterile gloves,  large sterile drape, hand hygiene, and Betadine prep. The right groin was prepped and draped in standard fashion. Preprocedure ultrasound demonstrated widely patent right common femoral artery. The procedure was planned. Subdermal Local anesthesia provided 1% lidocaine. A small skin nick was made. Under direct ultrasound visualization, the right common femoral is accessed with a 21 gauge micropuncture needle. A permanent ultrasound image was captured and stored in the record. Micropuncture sheath was introduced and limited right lower extremity angiogram was performed which demonstrated adequate puncture site for closure device use. A J wire was directed to the micropuncture set to the abdominal aorta and a 5 French, 10 cm vascular sheath was placed. Five French C2 catheter was then advanced to the level of the celiac artery and celiac angiogram was performed. Angiogram demonstrated widely patent celiac and hepatic arteries with completely embolized gastroduodenal artery with indwelling coils. The catheter was then advanced to the level of the superior mesenteric artery. Angiogram was performed which demonstrated widely patent superior mesenteric artery and branch vessel supplying the duodenum without evidence of active extravasation. Selective catheterization of the pancreaticoduodenal arcade from the proximal super mesenteric artery was attempted with multiple catheter and wire combinations, however unsuccessful due to tortuosity. Therefore, the 5  French sheath was exchanged for a 7 [place], 55 cm Tourguide steerable sheath. The sheath was positioned near the ostium of the SMA through which a 5 [place], angled tip glide catheter was advanced and used to help CT the steerable sheath. Next, the anterior pancreaticoduodenal arcade was selected with a Progreat alpha microcatheter and fathom 14 microwire. Angiogram was performed which demonstrated multifocal irregularity of the vessel supplying the duodenum however no  evidence of active, intraluminal extravasation. Given irregularity of the vessels, Gel-Foam slurry embolization was administered to near stasis. No evidence of reflux was observed. The microsystem was then retracted and repositioned within the posterior pancreaticoduodenal arcade. Sub selective catheterization was performed angiogram demonstrated duodenal supply with irregularity of the distal branch vessels, however no evidence of active intraluminal extravasation. Gel-Foam slurry embolization was also performed at this location. The microsystem was removed and repeat SMA angiogram was performed which again demonstrated no evidence of active extravasation about the duodenum however there was some vessel pruning observed. The catheter was removed. The sheath was exchanged for an 8 French Angio-Seal device which was deployed successfully without difficulty. Distal pulses were unchanged. The patient tolerated the procedure well was transferred back to the floor in stable condition. IMPRESSION: 1. No evidence of active extravasation about the duodenum, however multifocal vessel irregularity was observed. 2. Technically successful Gel-Foam slurry embolization of the anterior and posterior pancreaticoduodenal arcades. Ruthann Cancer, MD [facility] Radiology Electronically Signed   By: Ruthann Cancer M.D.   On: 11/16/2021 15:18   IR Angiogram Visceral Selective  Result Date: 11/16/2021 INDICATION: 61 year old male with history of recent gastrointestinal hemorrhage status post gastroduodenal artery coil embolization on 11/05/2021, currently admitted for rebleeding episode with EGD demonstrating hemorrhage from the duodenum. Endoscopic intervention was unsuccessful. EXAM: 1. Ultrasound-guided vascular access of the right common femoral artery. 2. Selective catheterization angiography of the celiac artery and superior mesenteric artery. 3. Sub selective catheterization and  angiography of anterior and posterior pancreaticoduodenal branches. 4. Gel-Foam embolization of anterior and posterior pancreaticoduodenal branches. MEDICATIONS: None. ANESTHESIA/SEDATION: Moderate (conscious) sedation was employed during this procedure. A total of Versed 1.5 mg and Fentanyl 75 mcg was administered intravenously. Moderate Sedation Time: 110 minutes. The patient's level of consciousness and vital signs were monitored continuously by radiology nursing throughout the procedure under my direct supervision. CONTRAST:  55mL OMNIPAQUE IOHEXOL 300 MG/ML SOLN, 56mL OMNIPAQUE IOHEXOL 300 MG/ML SOLN, 15mL OMNIPAQUE IOHEXOL 300 MG/ML SOLN FLUOROSCOPY: 34 minutes, 42 seconds. Radiation Exposure Index (as provided by the fluoroscopic device): 2,426 mGy Kerma COMPLICATIONS: None immediate. PROCEDURE: Informed consent was obtained from the patient following explanation of the procedure, risks, benefits and alternatives. The patient understands, agrees and consents for the procedure. All questions were addressed. A time out was performed prior to the initiation of the procedure. Maximal barrier sterile technique utilized including caps, mask, sterile gowns, sterile gloves, large sterile drape, hand hygiene, and Betadine prep. The right groin was prepped and draped in standard fashion. Preprocedure ultrasound demonstrated widely patent right common femoral artery. The procedure was planned. Subdermal Local anesthesia provided 1% lidocaine. A small skin nick was made. Under direct ultrasound visualization, the right common femoral is accessed with a 21 gauge micropuncture needle. A permanent ultrasound image was captured and stored in the record. Micropuncture sheath was introduced and limited right lower extremity angiogram was performed which demonstrated adequate puncture site for closure device use. A J wire was directed to the micropuncture set to the abdominal aorta and a  5 French, 10 cm vascular sheath was  placed. Five French C2 catheter was then advanced to the level of the celiac artery and celiac angiogram was performed. Angiogram demonstrated widely patent celiac and hepatic arteries with completely embolized gastroduodenal artery with indwelling coils. The catheter was then advanced to the level of the superior mesenteric artery. Angiogram was performed which demonstrated widely patent superior mesenteric artery and branch vessel supplying the duodenum without evidence of active extravasation. Selective catheterization of the pancreaticoduodenal arcade from the proximal super mesenteric artery was attempted with multiple catheter and wire combinations, however unsuccessful due to tortuosity. Therefore, the 5 [place] sheath was exchanged for a 7 [place], 55 cm Tourguide steerable sheath. The sheath was positioned near the ostium of the SMA through which a 5 [place], angled tip glide catheter was advanced and used to help CT the steerable sheath. Next, the anterior pancreaticoduodenal arcade was selected with a Progreat alpha microcatheter and fathom 14 microwire. Angiogram was performed which demonstrated multifocal irregularity of the vessel supplying the duodenum however no evidence of active, intraluminal extravasation. Given irregularity of the vessels, Gel-Foam slurry embolization was administered to near stasis. No evidence of reflux was observed. The microsystem was then retracted and repositioned within the posterior pancreaticoduodenal arcade. Sub selective catheterization was performed angiogram demonstrated duodenal supply with irregularity of the distal branch vessels, however no evidence of active intraluminal extravasation. Gel-Foam slurry embolization was also performed at this location. The microsystem was removed and repeat SMA angiogram was performed which again demonstrated no evidence of active extravasation about the duodenum however there was some vessel pruning observed. The catheter was  removed. The sheath was exchanged for an 8 French Angio-Seal device which was deployed successfully without difficulty. Distal pulses were unchanged. The patient tolerated the procedure well was transferred back to the floor in stable condition. IMPRESSION: 1. No evidence of active extravasation about the duodenum, however multifocal vessel irregularity was observed. 2. Technically successful Gel-Foam slurry embolization of the anterior and posterior pancreaticoduodenal arcades. Ruthann Cancer, MD [facility] Radiology Electronically Signed   By: Ruthann Cancer M.D.   On: 11/16/2021 15:18   IR Angiogram Visceral Selective  Result Date: 11/05/2021 INDICATION: Emergent acute upper GI bleed secondary to endoscopy proven duodenal bulb ulcer. Endoscopic hemostasis obtained. Patient now presents for emergent GDA coil embolization. EXAM: ULTRASOUND GUIDANCE FOR VASCULAR ACCESS CELIAC, COMMON HEPATIC, AND GDA ARTERIAL MICRO CATHETERIZATION AND ANGIOGRAMS SUCCESSFUL GDA MICRO COIL EMBOLIZATION MEDICATIONS: NONE. ANESTHESIA/SEDATION: GENERAL ANESTHESIA CONTRAST:  45 cc omni 300 FLUOROSCOPY TIME:  Fluoroscopy Time: 11 minutes 42 seconds (489 mGy). COMPLICATIONS: None immediate. PROCEDURE: Informed consent was obtained from the patient following explanation of the procedure, risks, benefits and alternatives. The patient understands, agrees and consents for the procedure. All questions were addressed. A time out was performed prior to the initiation of the procedure. Maximal barrier sterile technique utilized including caps, mask, sterile gowns, sterile gloves, large sterile drape, hand hygiene, and Betadine prep. Under sterile conditions, ultrasound micropuncture access performed the right common femoral artery. Images obtained for documentation. 018 guidewire inserted followed by the micro dilator. Five French sheath inserted over a Bentson guidewire. Initially C2 catheter was  advanced to select the celiac origin. Catheter position was unstable. Catheter exchanged for a Mickelson catheter. Mickelson catheter utilized to re select the celiac origin. Selective celiac angiogram performed. Celiac: Mild atherosclerotic change of the celiac origin. Celiac axis remains patent. Celiac branches including the splenic, common hepatic, GDA, proper hepatic, and left  and right hepatic arteries are all patent. Left gastric artery is also visualized and patent. No active bleeding demonstrated by angiography. Through the [facility] catheter, a Renegade STC catheter was advanced initially into the common hepatic artery. Common hepatic angiogram performed. Common hepatic: The common, proper, right and left hepatic arteries are visualized and patent. Catheter advanced over a double angled GT Glidewire into the GDA. GDA angiogram performed. GDA: GDA is patent. There is some irregular focal outpouching of the distal GDA noted but without active contrast extravasation. This can be seen with duodenal ulcer disease. Gastroepiploic vasculature is patent. There is vaso constriction throughout the pancreaticoduodenal arcade. GDA micro coil embolization: For coil embolization, 3, 4, and 5 mm IDC 2D interlock coils were deployed. A total of 5 coils were successfully deployed. Post embolization angiogram confirms near complete stasis within the GDA vascular territory. Again, no active arterial GI bleeding demonstrated by angiography. Catheter access removed. Hemostasis obtained with manual compression due to the degree of calcific atherosclerosis in the femoral artery. Patient tolerated procedure well. No immediate complication. IMPRESSION: Successful preventative GDA micro coil embolization emergently for an endoscopically proven duodenal bulb bleeding ulcer. Electronically Signed   By: Jerilynn Mages.  Shick M.D.   On: 11/05/2021 09:36   IR Angiogram Selective Each Additional Vessel  Result Date: 11/16/2021 INDICATION:  61 year old male with history of recent gastrointestinal hemorrhage status post gastroduodenal artery coil embolization on 11/05/2021, currently admitted for rebleeding episode with EGD demonstrating hemorrhage from the duodenum. Endoscopic intervention was unsuccessful. EXAM: 1. Ultrasound-guided vascular access of the right common femoral artery. 2. Selective catheterization angiography of the celiac artery and superior mesenteric artery. 3. Sub selective catheterization and angiography of anterior and posterior pancreaticoduodenal branches. 4. Gel-Foam embolization of anterior and posterior pancreaticoduodenal branches. MEDICATIONS: None. ANESTHESIA/SEDATION: Moderate (conscious) sedation was employed during this procedure. A total of Versed 1.5 mg and Fentanyl 75 mcg was administered intravenously. Moderate Sedation Time: 110 minutes. The patient's level of consciousness and vital signs were monitored continuously by radiology nursing throughout the procedure under my direct supervision. CONTRAST:  58mL OMNIPAQUE IOHEXOL 300 MG/ML SOLN, 44mL OMNIPAQUE IOHEXOL 300 MG/ML SOLN, 89mL OMNIPAQUE IOHEXOL 300 MG/ML SOLN FLUOROSCOPY: 34 minutes, 42 seconds. Radiation Exposure Index (as provided by the fluoroscopic device): 6,144 mGy Kerma COMPLICATIONS: None immediate. PROCEDURE: Informed consent was obtained from the patient following explanation of the procedure, risks, benefits and alternatives. The patient understands, agrees and consents for the procedure. All questions were addressed. A time out was performed prior to the initiation of the procedure. Maximal barrier sterile technique utilized including caps, mask, sterile gowns, sterile gloves, large sterile drape, hand hygiene, and Betadine prep. The right groin was prepped and draped in standard fashion. Preprocedure ultrasound demonstrated widely patent right common femoral artery. The procedure was planned. Subdermal Local anesthesia provided 1% lidocaine. A  small skin nick was made. Under direct ultrasound visualization, the right common femoral is accessed with a 21 gauge micropuncture needle. A permanent ultrasound image was captured and stored in the record. Micropuncture sheath was introduced and limited right lower extremity angiogram was performed which demonstrated adequate puncture site for closure device use. A J wire was directed to the micropuncture set to the abdominal aorta and a 5 French, 10 cm vascular sheath was placed. Five French C2 catheter was then advanced to the level of the celiac artery and celiac angiogram was performed. Angiogram demonstrated widely patent celiac and hepatic arteries with completely embolized gastroduodenal artery with indwelling coils. The catheter was then  advanced to the level of the superior mesenteric artery. Angiogram was performed which demonstrated widely patent superior mesenteric artery and branch vessel supplying the duodenum without evidence of active extravasation. Selective catheterization of the pancreaticoduodenal arcade from the proximal super mesenteric artery was attempted with multiple catheter and wire combinations, however unsuccessful due to tortuosity. Therefore, the 5 [place] sheath was exchanged for a 7 [place], 55 cm Tourguide steerable sheath. The sheath was positioned near the ostium of the SMA through which a 5 [place], angled tip glide catheter was advanced and used to help CT the steerable sheath. Next, the anterior pancreaticoduodenal arcade was selected with a Progreat alpha microcatheter and fathom 14 microwire. Angiogram was performed which demonstrated multifocal irregularity of the vessel supplying the duodenum however no evidence of active, intraluminal extravasation. Given irregularity of the vessels, Gel-Foam slurry embolization was administered to near stasis. No evidence of reflux was observed. The microsystem was then retracted and repositioned within the posterior pancreaticoduodenal  arcade. Sub selective catheterization was performed angiogram demonstrated duodenal supply with irregularity of the distal branch vessels, however no evidence of active intraluminal extravasation. Gel-Foam slurry embolization was also performed at this location. The microsystem was removed and repeat SMA angiogram was performed which again demonstrated no evidence of active extravasation about the duodenum however there was some vessel pruning observed. The catheter was removed. The sheath was exchanged for an 8 French Angio-Seal device which was deployed successfully without difficulty. Distal pulses were unchanged. The patient tolerated the procedure well was transferred back to the floor in stable condition. IMPRESSION: 1. No evidence of active extravasation about the duodenum, however multifocal vessel irregularity was observed. 2. Technically successful Gel-Foam slurry embolization of the anterior and posterior pancreaticoduodenal arcades. Ruthann Cancer, MD [facility] Radiology Electronically Signed   By: Ruthann Cancer M.D.   On: 11/16/2021 15:18   PERIPHERAL VASCULAR CATHETERIZATION  Result Date: 12/02/2021 DATE OF SERVICE: 12/02/2021  PATIENT:  Madhav Mohon  61 y.o. male  PRE-OPERATIVE DIAGNOSIS: End-stage renal disease, left upper extremity brachiocephalic arteriovenous fistula.  Left upper extremity swelling  POST-OPERATIVE DIAGNOSIS:  Same  PROCEDURE:  Left upper extremity fistulogram  SURGEON:  Surgeon(s) and Role:    * Cherre Robins, MD - Primary  ASSISTANT: none  An assistant was required to facilitate exposure and expedite the case.  ANESTHESIA:   local  EBL: minimal  BLOOD ADMINISTERED:none  DRAINS: none  LOCAL MEDICATIONS USED:  LIDOCAINE  SPECIMEN: None  COUNTS: confirmed correct .  TOURNIQUET:  none  PATIENT DISPOSITION:  PACU - hemodynamically stable.  Delay start of Pharmacological VTE agent (>24hrs) due to surgical blood loss or risk of  bleeding: no  INDICATION FOR PROCEDURE: Jeet Shough is a 61 y.o. male with left arm swelling with ipsilateral brachiocephalic arteriovenous fistula. After careful discussion of risks, benefits, and alternatives the patient was offered fistulogram. The patient understood and wished to proceed.  OPERATIVE FINDINGS: Unremarkable fistulogram.  The fistula is a bit tortuous as it courses to the upper extremity.  There is robust collateralization about the shoulder, but no evidence of central stenosis.  DESCRIPTION OF PROCEDURE: After identification of the patient in the pre-operative holding area, the patient was transferred to the operating room. The patient was positioned supine on the operating room table. Anesthesia was induced. The left arm was prepped and draped in standard fashion. A surgical pause was performed confirming correct patient, procedure, and operative location.  The left upper extremity brachiocephalic arteriovenous fistula was  accessed with micropuncture technique.  The micro sheath was advanced into the fistula.  Fistulography was performed in stations.  There is no evidence of central venous stenosis.  There is no stenosis of the cephalic arch.  The cephalic vein fistula in the upper arm was tortuous but widely patent.  Satisfied we ended the case here.  The sheath was withdrawn.  A figure-of-eight stitch of Monocryl was placed around the access.  Hemostasis was achieved.  Sterile bandage was applied.  Upon completion of the case instrument and sharps counts were confirmed correct. The patient was transferred to the PACU in good condition. I was present for all portions of the procedure.  [name]. Stanford Breed, MD [facility] Phone Number: [phone] 12/02/2021 10:53 AM   IR US Guide Vasc Access Right  Result Date: 11/16/2021 INDICATION: 61 year old male with history of recent gastrointestinal hemorrhage status post gastroduodenal artery coil embolization  on 11/05/2021, currently admitted for rebleeding episode with EGD demonstrating hemorrhage from the duodenum. Endoscopic intervention was unsuccessful. EXAM: 1. Ultrasound-guided vascular access of the right common femoral artery. 2. Selective catheterization angiography of the celiac artery and superior mesenteric artery. 3. Sub selective catheterization and angiography of anterior and posterior pancreaticoduodenal branches. 4. Gel-Foam embolization of anterior and posterior pancreaticoduodenal branches. MEDICATIONS: None. ANESTHESIA/SEDATION: Moderate (conscious) sedation was employed during this procedure. A total of Versed 1.5 mg and Fentanyl 75 mcg was administered intravenously. Moderate Sedation Time: 110 minutes. The patient's level of consciousness and vital signs were monitored continuously by radiology nursing throughout the procedure under my direct supervision. CONTRAST:  72mL OMNIPAQUE IOHEXOL 300 MG/ML SOLN, 71mL OMNIPAQUE IOHEXOL 300 MG/ML SOLN, 40mL OMNIPAQUE IOHEXOL 300 MG/ML SOLN FLUOROSCOPY: 34 minutes, 42 seconds. Radiation Exposure Index (as provided by the fluoroscopic device): 2,841 mGy Kerma COMPLICATIONS: None immediate. PROCEDURE: Informed consent was obtained from the patient following explanation of the procedure, risks, benefits and alternatives. The patient understands, agrees and consents for the procedure. All questions were addressed. A time out was performed prior to the initiation of the procedure. Maximal barrier sterile technique utilized including caps, mask, sterile gowns, sterile gloves, large sterile drape, hand hygiene, and Betadine prep. The right groin was prepped and draped in standard fashion. Preprocedure ultrasound demonstrated widely patent right common femoral artery. The procedure was planned. Subdermal Local anesthesia provided 1% lidocaine. A small skin nick was made. Under direct ultrasound visualization, the right common femoral is accessed with a 21 gauge  micropuncture needle. A permanent ultrasound image was captured and stored in the record. Micropuncture sheath was introduced and limited right lower extremity angiogram was performed which demonstrated adequate puncture site for closure device use. A J wire was directed to the micropuncture set to the abdominal aorta and a 5 French, 10 cm vascular sheath was placed. Five French C2 catheter was then advanced to the level of the celiac artery and celiac angiogram was performed. Angiogram demonstrated widely patent celiac and hepatic arteries with completely embolized gastroduodenal artery with indwelling coils. The catheter was then advanced to the level of the superior mesenteric artery. Angiogram was performed which demonstrated widely patent superior mesenteric artery and branch vessel supplying the duodenum without evidence of active extravasation. Selective catheterization of the pancreaticoduodenal arcade from the proximal super mesenteric artery was attempted with multiple catheter and wire combinations, however unsuccessful due to tortuosity. Therefore, the 5 [place] sheath was exchanged for a 7 [place], 55 cm Tourguide steerable sheath. The sheath was positioned near the ostium  of the SMA through which a 5 [place], angled tip glide catheter was advanced and used to help CT the steerable sheath. Next, the anterior pancreaticoduodenal arcade was selected with a Progreat alpha microcatheter and fathom 14 microwire. Angiogram was performed which demonstrated multifocal irregularity of the vessel supplying the duodenum however no evidence of active, intraluminal extravasation. Given irregularity of the vessels, Gel-Foam slurry embolization was administered to near stasis. No evidence of reflux was observed. The microsystem was then retracted and repositioned within the posterior pancreaticoduodenal arcade. Sub selective catheterization was performed angiogram demonstrated duodenal supply with irregularity of the  distal branch vessels, however no evidence of active intraluminal extravasation. Gel-Foam slurry embolization was also performed at this location. The microsystem was removed and repeat SMA angiogram was performed which again demonstrated no evidence of active extravasation about the duodenum however there was some vessel pruning observed. The catheter was removed. The sheath was exchanged for an 8 French Angio-Seal device which was deployed successfully without difficulty. Distal pulses were unchanged. The patient tolerated the procedure well was transferred back to the floor in stable condition. IMPRESSION: 1. No evidence of active extravasation about the duodenum, however multifocal vessel irregularity was observed. 2. Technically successful Gel-Foam slurry embolization of the anterior and posterior pancreaticoduodenal arcades. Ruthann Cancer, MD [facility] Radiology Electronically Signed   By: Ruthann Cancer M.D.   On: 11/16/2021 15:18   IR US Guide Vasc Access Right  Result Date: 11/05/2021 INDICATION: Emergent acute upper GI bleed secondary to endoscopy proven duodenal bulb ulcer. Endoscopic hemostasis obtained. Patient now presents for emergent GDA coil embolization. EXAM: ULTRASOUND GUIDANCE FOR VASCULAR ACCESS CELIAC, COMMON HEPATIC, AND GDA ARTERIAL MICRO CATHETERIZATION AND ANGIOGRAMS SUCCESSFUL GDA MICRO COIL EMBOLIZATION MEDICATIONS: NONE. ANESTHESIA/SEDATION: GENERAL ANESTHESIA CONTRAST:  45 cc omni 300 FLUOROSCOPY TIME:  Fluoroscopy Time: 11 minutes 42 seconds (489 mGy). COMPLICATIONS: None immediate. PROCEDURE: Informed consent was obtained from the patient following explanation of the procedure, risks, benefits and alternatives. The patient understands, agrees and consents for the procedure. All questions were addressed. A time out was performed prior to the initiation of the procedure. Maximal barrier sterile technique utilized including caps,  mask, sterile gowns, sterile gloves, large sterile drape, hand hygiene, and Betadine prep. Under sterile conditions, ultrasound micropuncture access performed the right common femoral artery. Images obtained for documentation. 018 guidewire inserted followed by the micro dilator. Five French sheath inserted over a Bentson guidewire. Initially C2 catheter was advanced to select the celiac origin. Catheter position was unstable. Catheter exchanged for a Mickelson catheter. Mickelson catheter utilized to re select the celiac origin. Selective celiac angiogram performed. Celiac: Mild atherosclerotic change of the celiac origin. Celiac axis remains patent. Celiac branches including the splenic, common hepatic, GDA, proper hepatic, and left and right hepatic arteries are all patent. Left gastric artery is also visualized and patent. No active bleeding demonstrated by angiography. Through the [facility] catheter, a Renegade STC catheter was advanced initially into the common hepatic artery. Common hepatic angiogram performed. Common hepatic: The common, proper, right and left hepatic arteries are visualized and patent. Catheter advanced over a double angled GT Glidewire into the GDA. GDA angiogram performed. GDA: GDA is patent. There is some irregular focal outpouching of the distal GDA noted but without active contrast extravasation. This can be seen with duodenal ulcer disease. Gastroepiploic vasculature is patent. There is vaso constriction throughout the pancreaticoduodenal arcade. GDA micro coil embolization: For coil embolization, 3, 4, and 5 mm IDC 2D interlock  coils were deployed. A total of 5 coils were successfully deployed. Post embolization angiogram confirms near complete stasis within the GDA vascular territory. Again, no active arterial GI bleeding demonstrated by angiography. Catheter access removed. Hemostasis obtained with manual compression due to the degree of calcific atherosclerosis in the femoral  artery. Patient tolerated procedure well. No immediate complication. IMPRESSION: Successful preventative GDA micro coil embolization emergently for an endoscopically proven duodenal bulb bleeding ulcer. Electronically Signed   By: Jerilynn Mages.  Shick M.D.   On: 11/05/2021 09:36   DG CHEST PORT 1 VIEW  Result Date: 11/17/2021 CLINICAL DATA:  Hypoxia EXAM: PORTABLE CHEST 1 VIEW COMPARISON:  Previous studies including the examination done on 11/13/2021 FINDINGS: Transverse diameter of heart is increased. Central pulmonary vessels are slightly less prominent. There are linear densities in the parahilar regions and medial right lower lung fields suggesting scarring or subsegmental atelectasis. There is interval improvement in aeration of parahilar regions. No new focal infiltrates are seen. There is no pleural effusion or pneumothorax. IMPRESSION: Cardiomegaly. There is decrease in pulmonary vascular congestion. There are no new focal pulmonary infiltrates. Electronically Signed   By: Elmer Picker M.D.   On: 11/17/2021 09:19   DG CHEST PORT 1 VIEW  Result Date: 11/13/2021 CLINICAL DATA:  Hypoxia. EXAM: PORTABLE CHEST 1 VIEW COMPARISON:  11/08/2021 and older exams. FINDINGS: Cardiac silhouette is mildly enlarged. No mediastinal or hilar masses. There are linear opacities in both lung bases and in the mid right lung. Hazy opacities noted in the left lower lung, behind the cardiac silhouette. Remainder of the lungs is clear. No pneumothorax. Skeletal structures are grossly intact. IMPRESSION: 1. Findings without convincing change from the most recent prior study allowing for differences in patient positioning and technique. 2. Linear opacities in the lung bases and right mid lung are consistent with atelectasis. Additional hazy opacity in the left lung base is also likely due to atelectasis, possibly with layering pleural fluid. Infection at the left lung base should be considered in the proper clinical setting. 3. No  evidence of pulmonary edema. Electronically Signed   By: Lajean Manes M.D.   On: 11/13/2021 10:13   DG Chest Port 1 View  Result Date: 11/08/2021 CLINICAL DATA:  Leukocytosis EXAM: PORTABLE CHEST 1 VIEW COMPARISON:  Previous studies including the examination of 11/05/2021 FINDINGS: Transverse diameter of heart is increased. There are linear densities in both lower lung fields suggesting scarring or subsegmental atelectasis. Central pulmonary vessels are less prominent. There is improvement in aeration of left parahilar region. There is no significant pleural effusion or pneumothorax. IMPRESSION: Cardiomegaly. There is interval decrease in pulmonary vascular congestion and improvement in aeration of parahilar regions. There residual linear densities in both lower lung fields suggesting scarring or subsegmental atelectasis. Electronically Signed   By: Elmer Picker M.D.   On: 11/08/2021 13:37   DG Chest Port 1 View  Result Date: 11/05/2021 CLINICAL DATA:  Weakness, vomiting blood. EXAM: PORTABLE CHEST 1 VIEW COMPARISON:  None. FINDINGS: The heart is enlarged the mediastinal contours within normal limits. Atherosclerotic calcification of the aorta is noted. The pulmonary vasculature is distended. Interstitial prominence is noted bilaterally. Subsegmental atelectasis is noted at the right lung base. No effusion or pneumothorax. No acute osseous abnormality. IMPRESSION: 1. Cardiomegaly with pulmonary vascular congestion. 2. Interstitial prominence bilaterally, possible edema or infiltrate. Electronically Signed   By: Brett Fairy M.D.   On: 11/05/2021 01:22   DG Abd Portable 1V  Result Date: 11/21/2021 CLINICAL DATA:  NG  tube placement EXAM: PORTABLE ABDOMEN - 1 VIEW COMPARISON:  11/17/2021 FINDINGS: Tip of enteric tube is seen within the stomach. There is possible surgical drain in the right upper abdomen. There is possible cholecystostomy catheter in the right upper quadrant. Skin staples are seen.  There are linear densities in the lower lung fields suggesting subsegmental atelectasis. Transverse diameter of heart is increased. IMPRESSION: Tip of enteric tube is seen in the stomach. Electronically Signed   By: Elmer Picker M.D.   On: 11/21/2021 13:44   DG UGI W SINGLE CM (SOL OR THIN BA)  Result Date: 11/24/2021 CLINICAL DATA:  61 year old male with recurrent UGI bleed s/p coil embolization with IR 11/05/21 and 11/16/21. S/p ex lap with suture ligation od duodenal ulcer on the posterior wall on 11/20/21. Request for water soluble UGI to rule out leak. EXAM: DG UGI W SINGLE CM TECHNIQUE: Scout radiograph was obtained. Single contrast examination was performed using water soluble contrast. Total of 100 mL of Omnipaque 300 was injected through NGT. This exam was performed by Aimee Han PA-C, and was supervised and interpreted by Dr. Sabino Dick. FLUOROSCOPY TIME:  Radiation Exposure Index (as provided by the fluoroscopic device): 8.40 mGy If the device does not provide the exposure index: Fluoroscopy Time: 48 seconds COMPARISON:  November 20, 2021. FINDINGS: Scout Radiograph: Midline surgical staples, embolization coil, surgical clip in RLQ, and NGT noted. Esophagus: Not evaluated Esophageal motility:  Not evaluated Gastroesophageal reflux:  Not evaluated Ingested 35mm barium tablet:  Not given Stomach: Normal appearance Gastric emptying: Normal. Duodenum: Visualized portion of duodenum is unremarkable. Other:  No contrast extravasation is seen to suggest leakage. IMPRESSION: No contrast extravasation is seen from the stomach or duodenum to suggest leakage. Electronically Signed   By: Marijo Conception M.D.   On: 11/24/2021 13:13   VAS [place] ABI WITH/WO TBI  Result Date: 12/01/2021  LOWER EXTREMITY DOPPLER STUDY Patient Name:  TAMAR LIPSCOMB  Date of Exam:   12/01/2021 Medical Rec #: [id number]        Accession #:    [id number] Date of Birth: [birth year]        Patient Gender: M Patient Age:   32 years Exam  Location:  [facility] [place] Procedure:      VAS [place] ABI WITH/WO TBI Referring Phys: Jaysha Lasure --------------------------------------------------------------------------------  High Risk Factors: Hypertension. Other Factors: GI bleed. CHF. Cocaine and marijuana abuse. ESRD, on dialysis.  Limitations: Today's exam was limited due to Bilateral feet throbbing and              swelling. Comparison Study: No prior study Performing Technologist: Sharion Dove RVS  Examination Guidelines: A complete evaluation includes at minimum, Doppler waveform signals and systolic blood pressure reading at the level of bilateral brachial, anterior tibial, and posterior tibial arteries, when vessel segments are accessible. Bilateral testing is considered an integral part of a complete examination. Photoelectric Plethysmograph (PPG) waveforms and toe systolic pressure readings are included as required and additional duplex testing as needed. Limited examinations for reoccurring indications may be performed as noted.  ABI Findings: +---------+------------------+-----+-----------+--------+  Right     Rt Pressure (mmHg) Index Waveform    Comment   +---------+------------------+-----+-----------+--------+  Brachial  140                      multiphasic           +---------+------------------+-----+-----------+--------+  PTA       175  1.25  multiphasic           +---------+------------------+-----+-----------+--------+  DP        173                1.24  multiphasic           +---------+------------------+-----+-----------+--------+  Great Toe 134                0.96                        +---------+------------------+-----+-----------+--------+ +---------+------------------+-----+-----------+---------------+  Left      Lt Pressure (mmHg) Index Waveform    Comment          +---------+------------------+-----+-----------+---------------+  Brachial                                       dialysis access   +---------+------------------+-----+-----------+---------------+  PTA       179                1.28  multiphasic                  +---------+------------------+-----+-----------+---------------+  DP        180                1.29  multiphasic                  +---------+------------------+-----+-----------+---------------+  Great Toe 148                1.06                               +---------+------------------+-----+-----------+---------------+ +-------+-----------+-----------+------------+------------+  ABI/TBI Today's ABI Today's TBI Previous ABI Previous TBI  +-------+-----------+-----------+------------+------------+  Right   1.25        0.96                                   +-------+-----------+-----------+------------+------------+  Left    1.29        1.06                                   +-------+-----------+-----------+------------+------------+  Summary: Right: Resting right ankle-brachial index is within normal range. No evidence of significant right lower extremity arterial disease. The right toe-brachial index is normal. Left: Resting left ankle-brachial index is within normal range. No evidence of significant left lower extremity arterial disease. The left toe-brachial index is normal.  *See table(s) above for measurements and observations.     Preliminary    IR EMBO ART  VEN HEMORR LYMPH EXTRAV  INC GUIDE ROADMAPPING  Result Date: 11/16/2021 INDICATION: 61 year old male with history of recent gastrointestinal hemorrhage status post gastroduodenal artery coil embolization on 11/05/2021, currently admitted for rebleeding episode with EGD demonstrating hemorrhage from the duodenum. Endoscopic intervention was unsuccessful. EXAM: 1. Ultrasound-guided vascular access of the right common femoral artery. 2. Selective catheterization angiography of the celiac artery and superior mesenteric artery. 3. Sub selective catheterization and angiography of anterior and posterior pancreaticoduodenal  branches. 4. Gel-Foam embolization of anterior and posterior pancreaticoduodenal branches. MEDICATIONS: None. ANESTHESIA/SEDATION: Moderate (conscious) sedation was employed during this procedure. A total of Versed 1.5 mg and Fentanyl  75 mcg was administered intravenously. Moderate Sedation Time: 110 minutes. The patient's level of consciousness and vital signs were monitored continuously by radiology nursing throughout the procedure under my direct supervision. CONTRAST:  58mL OMNIPAQUE IOHEXOL 300 MG/ML SOLN, 31mL OMNIPAQUE IOHEXOL 300 MG/ML SOLN, 58mL OMNIPAQUE IOHEXOL 300 MG/ML SOLN FLUOROSCOPY: 34 minutes, 42 seconds. Radiation Exposure Index (as provided by the fluoroscopic device): 4,765 mGy Kerma COMPLICATIONS: None immediate. PROCEDURE: Informed consent was obtained from the patient following explanation of the procedure, risks, benefits and alternatives. The patient understands, agrees and consents for the procedure. All questions were addressed. A time out was performed prior to the initiation of the procedure. Maximal barrier sterile technique utilized including caps, mask, sterile gowns, sterile gloves, large sterile drape, hand hygiene, and Betadine prep. The right groin was prepped and draped in standard fashion. Preprocedure ultrasound demonstrated widely patent right common femoral artery. The procedure was planned. Subdermal Local anesthesia provided 1% lidocaine. A small skin nick was made. Under direct ultrasound visualization, the right common femoral is accessed with a 21 gauge micropuncture needle. A permanent ultrasound image was captured and stored in the record. Micropuncture sheath was introduced and limited right lower extremity angiogram was performed which demonstrated adequate puncture site for closure device use. A J wire was directed to the micropuncture set to the abdominal aorta and a 5 French, 10 cm vascular sheath was placed. Five French C2 catheter was then advanced to the level  of the celiac artery and celiac angiogram was performed. Angiogram demonstrated widely patent celiac and hepatic arteries with completely embolized gastroduodenal artery with indwelling coils. The catheter was then advanced to the level of the superior mesenteric artery. Angiogram was performed which demonstrated widely patent superior mesenteric artery and branch vessel supplying the duodenum without evidence of active extravasation. Selective catheterization of the pancreaticoduodenal arcade from the proximal super mesenteric artery was attempted with multiple catheter and wire combinations, however unsuccessful due to tortuosity. Therefore, the 5 [place] sheath was exchanged for a 7 [place], 55 cm Tourguide steerable sheath. The sheath was positioned near the ostium of the SMA through which a 5 [place], angled tip glide catheter was advanced and used to help CT the steerable sheath. Next, the anterior pancreaticoduodenal arcade was selected with a Progreat alpha microcatheter and fathom 14 microwire. Angiogram was performed which demonstrated multifocal irregularity of the vessel supplying the duodenum however no evidence of active, intraluminal extravasation. Given irregularity of the vessels, Gel-Foam slurry embolization was administered to near stasis. No evidence of reflux was observed. The microsystem was then retracted and repositioned within the posterior pancreaticoduodenal arcade. Sub selective catheterization was performed angiogram demonstrated duodenal supply with irregularity of the distal branch vessels, however no evidence of active intraluminal extravasation. Gel-Foam slurry embolization was also performed at this location. The microsystem was removed and repeat SMA angiogram was performed which again demonstrated no evidence of active extravasation about the duodenum however there was some vessel pruning observed. The catheter was removed. The sheath was exchanged for an 8 French Angio-Seal device  which was deployed successfully without difficulty. Distal pulses were unchanged. The patient tolerated the procedure well was transferred back to the floor in stable condition. IMPRESSION: 1. No evidence of active extravasation about the duodenum, however multifocal vessel irregularity was observed. 2. Technically successful Gel-Foam slurry embolization of the anterior and posterior pancreaticoduodenal arcades. Ruthann Cancer, MD [facility] Radiology Electronically Signed   By: Ruthann Cancer M.D.   On: 11/16/2021 15:18  IR EMBO ART  VEN HEMORR LYMPH EXTRAV  INC GUIDE ROADMAPPING  Result Date: 11/05/2021 INDICATION: Emergent acute upper GI bleed secondary to endoscopy proven duodenal bulb ulcer. Endoscopic hemostasis obtained. Patient now presents for emergent GDA coil embolization. EXAM: ULTRASOUND GUIDANCE FOR VASCULAR ACCESS CELIAC, COMMON HEPATIC, AND GDA ARTERIAL MICRO CATHETERIZATION AND ANGIOGRAMS SUCCESSFUL GDA MICRO COIL EMBOLIZATION MEDICATIONS: NONE. ANESTHESIA/SEDATION: GENERAL ANESTHESIA CONTRAST:  45 cc omni 300 FLUOROSCOPY TIME:  Fluoroscopy Time: 11 minutes 42 seconds (489 mGy). COMPLICATIONS: None immediate. PROCEDURE: Informed consent was obtained from the patient following explanation of the procedure, risks, benefits and alternatives. The patient understands, agrees and consents for the procedure. All questions were addressed. A time out was performed prior to the initiation of the procedure. Maximal barrier sterile technique utilized including caps, mask, sterile gowns, sterile gloves, large sterile drape, hand hygiene, and Betadine prep. Under sterile conditions, ultrasound micropuncture access performed the right common femoral artery. Images obtained for documentation. 018 guidewire inserted followed by the micro dilator. Five French sheath inserted over a Bentson guidewire. Initially C2 catheter was advanced to select the celiac origin.  Catheter position was unstable. Catheter exchanged for a Mickelson catheter. Mickelson catheter utilized to re select the celiac origin. Selective celiac angiogram performed. Celiac: Mild atherosclerotic change of the celiac origin. Celiac axis remains patent. Celiac branches including the splenic, common hepatic, GDA, proper hepatic, and left and right hepatic arteries are all patent. Left gastric artery is also visualized and patent. No active bleeding demonstrated by angiography. Through the [facility] catheter, a Renegade STC catheter was advanced initially into the common hepatic artery. Common hepatic angiogram performed. Common hepatic: The common, proper, right and left hepatic arteries are visualized and patent. Catheter advanced over a double angled GT Glidewire into the GDA. GDA angiogram performed. GDA: GDA is patent. There is some irregular focal outpouching of the distal GDA noted but without active contrast extravasation. This can be seen with duodenal ulcer disease. Gastroepiploic vasculature is patent. There is vaso constriction throughout the pancreaticoduodenal arcade. GDA micro coil embolization: For coil embolization, 3, 4, and 5 mm IDC 2D interlock coils were deployed. A total of 5 coils were successfully deployed. Post embolization angiogram confirms near complete stasis within the GDA vascular territory. Again, no active arterial GI bleeding demonstrated by angiography. Catheter access removed. Hemostasis obtained with manual compression due to the degree of calcific atherosclerosis in the femoral artery. Patient tolerated procedure well. No immediate complication. IMPRESSION: Successful preventative GDA micro coil embolization emergently for an endoscopically proven duodenal bulb bleeding ulcer. Electronically Signed   By: Jerilynn Mages.  Shick M.D.   On: 11/05/2021 09:36   CT ANGIO GI BLEED  Result Date: 11/20/2021 CLINICAL DATA:  Upper GI bleed common non variceal. Arterial source on endoscopy.  EXAM: CTA ABDOMEN AND PELVIS WITHOUT AND WITH CONTRAST TECHNIQUE: Multidetector CT imaging of the abdomen and pelvis was performed using the standard protocol during bolus administration of intravenous contrast. Multiplanar reconstructed images and MIPs were obtained and reviewed to evaluate the vascular anatomy. RADIATION DOSE REDUCTION: This exam was performed according to the departmental dose-optimization program which includes automated exposure control, adjustment of the mA and/or kV according to patient size and/or use of iterative reconstruction technique. CONTRAST:  171mL OMNIPAQUE IOHEXOL 350 MG/ML SOLN COMPARISON:  11/15/2021, 06/21/2021. FINDINGS: VASCULAR Aorta: Aortic atherosclerosis. Normal caliber aorta without aneurysm, dissection, vasculitis or significant stenosis. Celiac: Patent without evidence of aneurysm, dissection, vasculitis or significant stenosis. Metallic coils are present in the region of the gastroduodenal  artery. SMA: Patent without evidence of aneurysm, dissection, vasculitis or significant stenosis. Renals: Mild atherosclerotic calcification. Both renal arteries are patent without evidence of aneurysm, dissection, vasculitis, fibromuscular dysplasia or significant stenosis. IMA: Patent without evidence of aneurysm, dissection, vasculitis or significant stenosis. Inflow: Atherosclerotic calcifications. Patent without evidence of aneurysm, dissection, vasculitis or significant stenosis. Proximal Outflow: Atherosclerotic calcifications. Bilateral common femoral and visualized portions of the superficial and profunda femoral arteries are patent without evidence of aneurysm, dissection, vasculitis or significant stenosis. Veins: No obvious venous abnormality within the limitations of this arterial phase study. Review of the MIP images confirms the above findings. NON-VASCULAR Lower chest: The heart is enlarged and there is a moderate pericardial effusion measuring up to 1.7 cm. Strandy  atelectasis or infiltrate is present at the lung bases. There is a trace left pleural effusion. Hepatobiliary: No focal liver abnormality. A stone is present within the gallbladder. The gallbladder is contracted and gallbladder wall thickening is noted. No biliary ductal dilatation. Pancreas: There is mild dilatation of the pancreatic duct measuring 4 mm, not significantly changed from the prior exam. No local inflammatory changes. Spleen: The spleen is normal in size. There is a stable hypodensity in the spleen measuring 2 cm. Adrenals/Urinary Tract: The adrenal glands are within normal limits. Bilateral renal atrophy is noted. There is a complex hypodense mass in the right kidney measuring 2.9 cm. Stable subcentimeter hypodensities are noted in the kidneys bilaterally. No hydronephrosis. Excreted contrast is present in the urinary bladder. Stomach/Bowel: The stomach is partially distended. Evaluation for hemorrhage is limited in the presence of oral contrast. No bowel obstruction, free air, or pneumatosis. Right hemicolectomy changes are present. Lymphatic: Evaluation for lymphadenopathy is limited due to ascites. Reproductive: Prostate is unremarkable. Other: Moderate ascites is noted. Musculoskeletal: No acute osseous abnormality. IMPRESSION: VASCULAR 1. No evidence of active GI hemorrhage. 2. Coil embolization of the gastroduodenal artery. 3. Aortic atherosclerosis with no significant stenosis or aneurysm. NON-VASCULAR 1. Stable moderate ascites. 2. Cardiomegaly with moderate pericardial effusion. 3. Cholelithiasis. 4. Strandy atelectasis or infiltrate at the lung bases. Small left pleural effusion. 5. Bilateral renal atrophy with multiple stable hypodensities. There is a complex lesion in the mid right kidney, stable from prior exams and previously characterized as a hemorrhagic or proteinaceous cyst on MRI. Electronically Signed   By: Brett Fairy M.D.   On: 11/20/2021 04:39   CT ANGIO GI BLEED  Result  Date: 11/15/2021 CLINICAL DATA:  Lower GI bleed. Rectal bleeding. End-stage renal disease on dialysis. Recent coil embolization of the gastroduodenal artery for duodenal bulb ulcer. EXAM: CTA ABDOMEN AND PELVIS WITHOUT AND WITH CONTRAST TECHNIQUE: Multidetector CT imaging of the abdomen and pelvis was performed using the standard protocol during bolus administration of intravenous contrast. Multiplanar reconstructed images and MIPs were obtained and reviewed to evaluate the vascular anatomy. RADIATION DOSE REDUCTION: This exam was performed according to the departmental dose-optimization program which includes automated exposure control, adjustment of the mA and/or kV according to patient size and/or use of iterative reconstruction technique. CONTRAST:  14mL OMNIPAQUE IOHEXOL 350 MG/ML SOLN COMPARISON:  11/12/2021 and 06/25/2021 FINDINGS: VASCULAR Aorta: Normal caliber of the abdominal aorta with atherosclerotic calcifications. Negative for dissection. No significant stenosis. Celiac: Patent without evidence of aneurysm, dissection, vasculitis or significant stenosis. Coil embolization of the gastroduodenal artery. Common hepatic artery and proper hepatic artery are patent. SMA: Patent without evidence of aneurysm, dissection, vasculitis or significant stenosis. Renals: Both renal arteries are patent without evidence of aneurysm, dissection, vasculitis, fibromuscular  dysplasia or significant stenosis. IMA: Patent without evidence of aneurysm, dissection, vasculitis or significant stenosis. Inflow: Patent without evidence of aneurysm, dissection, vasculitis or significant stenosis. Proximal Outflow: Proximal femoral arteries are patent bilaterally. Veins: IVC and proximal iliac veins are patent. Main portal venous system is patent. Review of the MIP images confirms the above findings. NON-VASCULAR Lower chest: Again noted is a focus of consolidation in the posterior right lower lobe that was present on the most  recent examination but new since 11/05/2021. This could represent atelectasis versus a small focus of infection. Additional patchy densities in the left lower lobe. Again noted is a moderate amount of pericardial fluid. Hepatobiliary: Probable gallstone. There is no gallbladder distension. No focal liver lesion. No significant biliary dilatation. Pancreas: Chronic pancreatic duct dilatation. No evidence for acute pancreatic inflammation. Spleen: Again noted is low-density structure along the superior aspect of the spleen that measures roughly 2.0 cm and similar to the exam from 06/25/2021. There is no splenic enlargement. Adrenals/Urinary Tract: Normal appearance of the adrenal glands. Both kidneys are atrophic with numerous small cystic structures. Again noted is a hyperdense structure along the medial right kidney that measures 2.9 cm. No significant enhancement in this structure and this likely represents a hemorrhagic or proteinaceous cyst. No hydronephrosis. Urinary bladder is decompressed and poorly characterized. Stomach/Bowel: Postoperative changes likely representing right hemicolectomy. Bowel anastomosis and postsurgical changes in the anterior abdomen. No clear evidence for active GI bleeding. Fluid-filled loops of bowel throughout the abdomen and pelvis. No evidence for an obstructive process. Lymphatic: No significant lymph node enlargement in the abdomen pelvis. Reproductive: Prostate is unremarkable. Other: Again noted is abdominal ascites. There has been redistribution of the ascites with more fluid along the left side of the abdomen and less fluid in the upper abdomen. Overall, the amount ascites may have decreased since 11/12/2021. Musculoskeletal: No acute bone abnormality. IMPRESSION: VASCULAR 1. No evidence for active GI bleeding. 2. Coil embolization of the gastroduodenal artery. 3. Aorta and main visceral arteries are patent. NON-VASCULAR 1. Ascites. Abdominal ascites may have slightly  decreased since 11/12/2021 as described. 2. Small focus of consolidation in the posterior right lower lobe. Findings could represent atelectasis versus small focus of infection. 3. Cholelithiasis.  No evidence for gallbladder distension. 4. Bilateral renal atrophy with numerous bilateral renal cysts. 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 Hyperdense non-enhancing structure in the right kidney probably represents a complex or hemorrhagic cyst. 5. Chronic dilatation of the main pancreatic duct. 6. Postoperative bowel changes. 7. Stable pericardial effusion. Electronically Signed   By: Markus Daft M.D.   On: 11/15/2021 09:13   CT Angio Abd/Pel W and/or Wo Contrast  Result Date: 11/12/2021 CLINICAL DATA:  via EMS; reported found on the floor;rectal bleeding; approx 400 ml blood loss per EMS; reported patient is also a dialysis patient and had a dull session today. Per EMS patient is slow to response; stated prior hx of bleeding ulcers and has had + blood on vomiting also. EXAM: CTA ABDOMEN AND PELVIS WITHOUT AND WITH CONTRAST TECHNIQUE: Multidetector CT imaging of the abdomen and pelvis was performed using the standard protocol during bolus administration of intravenous contrast. Multiplanar reconstructed images and MIPs were obtained and reviewed to evaluate the vascular anatomy. RADIATION DOSE REDUCTION: This exam was performed according to the departmental dose-optimization program which includes automated exposure control, adjustment of the mA and/or kV according to patient size and/or use of iterative reconstruction technique. CONTRAST:  170mL OMNIPAQUE IOHEXOL  350 MG/ML SOLN COMPARISON:  CT, 11/05/2021. FINDINGS: VASCULAR Aorta: Normal in caliber. Diffuse atherosclerosis but no significant stenosis. No dissection. Celiac: Mild atherosclerosis at the origin. No significant stenosis. There are micro embolization coils along the gastric duodenal artery, new since the  prior CT. No flow seen in the gastric duodenal artery. SMA: Patent without evidence of aneurysm, dissection, vasculitis or significant stenosis. Renals: Both renal arteries are patent without evidence of aneurysm, dissection, vasculitis, fibromuscular dysplasia or significant stenosis. IMA: Small I IMA, not well-defined. Inflow: Common and external iliac artery atherosclerosis with no significant stenosis. Proximal Outflow: Common femoral artery atherosclerosis with no significant stenosis. Patent proximal femoral arteries and deep femoral arteries. Veins: No obvious venous abnormality within the limitations of this arterial phase study. Review of the MIP images confirms the above findings. NON-VASCULAR Lower chest: Focal area of opacity in the dependent right lower lobe, new. This is peribronchovascular and may be infectious or inflammatory but is nonspecific. Hepatobiliary: Unremarkable liver. Gallbladder mostly contracted. No convincing bile duct dilation. Pancreas: Duct is dilated up to 8 mm, measuring larger than on the recent prior CT. No pancreatic mass or convincing inflammation. Spleen: Low-density lesion over the dome of the spleen, 1.5 cm in size. This is consistent with a cyst and is unchanged. No other splenic abnormality. Adrenals/Urinary Tract: No adrenal masses. Marked bilateral renal atrophy with multiple renal masses. The largest of these arises from the anterior right kidney midpole, has an enhancing rim and is relative increased attenuation compared to other low-density masses. This is consistent with a chronic hemorrhagic cyst, and was previously evaluated with MRI on 06/21/2021. No renal stones. No hydronephrosis. Ureters not well visualized, but no evidence of ureteral dilation. Bladder is decompressed. Stomach/Bowel: Stomach is unremarkable. Small bowel and colon are normal in overall caliber. Right mid abdomen colonic anastomosis staples consistent with an ileocolic anastomosis. Multiple  additional vascular clips in the right lower quadrant and right mid abdomen. Small bowel and colon are not well-defined due to adjacent ascites, limiting contrast. No convincing bowel inflammation. There is no extravasation of contrast to indicate an active GI bleeding source. Lymphatic: No enlarged lymph nodes. Reproductive: Unremarkable. Other: Moderate ascites similar to the prior CT. Musculoskeletal: No fracture or acute finding.  No bone lesion. IMPRESSION: VASCULAR 1. No evidence of an active GI bleeding source. 2. No enhancement/flow in the embolized gastro duodenal artery. 3. Aortic atherosclerosis. No aneurysm, dissection or significant stenosis. 4. Small irregular inferior mesenteric artery. Remaining aortic branch vessels are widely patent. NON-VASCULAR 1. No convincing acute findings. 2. Pancreatic duct is more dilated than on the prior CT, up to 8 mm, of unclear etiology. 3. Findings of end-stage renal disease. 4. Moderate ascites similar to the prior CT. 5. Stable changes from previous bowel surgery. Electronically Signed   By: Lajean Manes M.D.   On: 11/12/2021 17:05    Microbiology: Results for orders placed or performed during the hospital encounter of 11/12/21  Resp Panel by RT-PCR (Flu A&B, Covid) Nasopharyngeal Swab     Status: None   Collection Time: 11/12/21  4:22 PM   Specimen: Nasopharyngeal Swab; Nasopharyngeal(NP) swabs in vial transport medium  Result Value Ref Range Status   SARS Coronavirus 2 by RT PCR NEGATIVE NEGATIVE Final    Comment: (NOTE) SARS-CoV-2 target nucleic acids are NOT DETECTED.  The SARS-CoV-2 RNA is generally detectable in upper respiratory specimens during the acute phase of infection. The lowest concentration of SARS-CoV-2 viral copies this assay can detect is 138  copies/mL. A negative result does not preclude SARS-Cov-2 infection and should not be used as the sole basis for treatment or other patient management decisions. A negative result may occur  with  improper specimen collection/handling, submission of specimen other than nasopharyngeal swab, presence of viral mutation(s) within the areas targeted by this assay, and inadequate number of viral copies(<138 copies/mL). A negative result must be combined with clinical observations, patient history, and epidemiological information. The expected result is Negative.  Fact Sheet for Patients:  EntrepreneurPulse.com.au  Fact Sheet for Healthcare Providers:  IncredibleEmployment.be  This test is no t yet approved or cleared by the [place] FDA and  has been authorized for detection and/or diagnosis of SARS-CoV-2 by FDA under an Emergency Use Authorization (EUA). This EUA will remain  in effect (meaning this test can be used) for the duration of the COVID-19 declaration under Section 564(b)(1) of the Act, 21 U.S.C.section 360bbb-3(b)(1), unless the authorization is terminated  or revoked sooner.       Influenza A by PCR NEGATIVE NEGATIVE Final   Influenza B by PCR NEGATIVE NEGATIVE Final    Comment: (NOTE) The Xpert Xpress SARS-CoV-2/FLU/RSV plus assay is intended as an aid in the diagnosis of influenza from Nasopharyngeal swab specimens and should not be used as a sole basis for treatment. Nasal washings and aspirates are unacceptable for Xpert Xpress SARS-CoV-2/FLU/RSV testing.  Fact Sheet for Patients: EntrepreneurPulse.com.au  Fact Sheet for Healthcare Providers: IncredibleEmployment.be  This test is not yet approved or cleared by the [place] FDA and has been authorized for detection and/or diagnosis of SARS-CoV-2 by FDA under an Emergency Use Authorization (EUA). This EUA will remain in effect (meaning this test can be used) for the duration of the COVID-19 declaration under Section 564(b)(1) of the Act, 21 U.S.C. section 360bbb-3(b)(1), unless the authorization is terminated  or revoked.  Performed at [facility], [place] [address], [place], [place] [address]     Labs: CBC: Recent Labs  Lab 11/27/21 [phone] 11/28/21 0442 12/01/21 0445 12/02/21 0054  WBC 7.5 9.7  --   --   HGB 7.2* 7.4* 7.3* 7.3*  HCT 22.7* 22.9* 23.1* 23.8*  MCV 87.6 88.4  --   --   PLT 371 365  --   --    Basic Metabolic Panel: Recent Labs  Lab 11/27/21 0353 12/01/21 0445  NA 132* 130*  K 3.3* 3.6  CL 96* 96*  CO2 26 24  GLUCOSE 101* 83  BUN 24* 51*  CREATININE 4.80* 6.99*  CALCIUM 7.4* 7.8*  MG  --  2.0  PHOS  --  1.9*   Liver Function Tests: Recent Labs  Lab 12/01/21 0445  ALBUMIN 1.5*   CBG: No results for input(s): GLUCAP in the last 168 hours.  Discharge time spent: greater than 30 minutes.  Signed: Mercy Riding, MD [facility] 12/02/2021

## 2021-12-02 NOTE — TOC Progression Note (Addendum)
Transition of Care [facility]) - Progression Note    Patient Details  Name: Timothy [name] MRN: [id number] Date of Birth: [birth year]  [facility]) CM/SW [place], RN Phone Number:[phone]  12/02/2021, 10:37 AM  Clinical Narrative:    Patient has recommendation for [facility] PT. CM at bedside to offer choice. Patient has no preference. CM will call referral. [facility] is unable to accept patient. Referral has been sent to [facility] response pending.  DME rollator request has been sent to [employer]. [employer] to deliver to bedside.   [facility] unable to accept [facility] referral  [facility] referral has been accepted by Amy with [facility]. Start of services can not begin until Tuesday Dec 06, 2021. Info added to AVS.   Expected Discharge Plan: Skilled Nursing Facility Barriers to Discharge: SNF Pending bed offer, Awaiting State Approval (PASRR) (polysubsstance abuse)  Expected Discharge Plan and Services Expected Discharge Plan: [place] In-house Referral: Clinical Social Work     Living arrangements for the past 2 months: Apartment                                       Social Determinants of Health (SDOH) Interventions    Readmission Risk Interventions Readmission Risk Prevention Plan 11/16/2021 11/09/2021  Transportation Screening - Complete  Medication Review Press photographer) Referral to Pharmacy Referral to Pharmacy  PCP or Specialist appointment within 3-5 days of discharge Not Complete Complete  PCP/Specialist Appt Not Complete comments patient not medically ready to d/c -  [place] or Home Care Consult Not Complete Patient refused  [place] or Home Care Consult Pt Refusal Comments not complete, waiting on PT recs -  SW Recovery Care/Counseling Consult Complete Complete  Palliative Care Screening Not Applicable Not [facility] Not Complete Not Applicable  SNF Comments Pending PT recommendations (CSW following) -  Some  recent data might be hidden

## 2021-12-02 NOTE — Progress Notes (Signed)
D/C order noted. Contacted [place] to advise clinic of pt's d/c today and pt to resume care tomorrow.   [name] Renal Navigator [phone]

## 2021-12-04 ENCOUNTER — Telehealth: Payer: Self-pay | Admitting: Physician Assistant

## 2021-12-04 NOTE — Telephone Encounter (Signed)
Transition of care contact from inpatient facility  Date of Discharge:  12/02/21 Date of Contact: 12/04/21 Method of contact: Phone  Attempted to contact patient to discuss transition of care from inpatient admission. Patient did not answer the phone. Message was left on the patient's voicemail with call back instructions.  Anice Paganini, PA-C 12/04/2021, 12:15 PM  [facility] Pager: [phone]

## 2021-12-05 MED FILL — Heparin Sod (Porcine)-NaCl IV Soln 1000 Unit/500ML-0.9%: INTRAVENOUS | Qty: 500 | Status: AC

## 2021-12-30 ENCOUNTER — Observation Stay (HOSPITAL_COMMUNITY)
Admission: EM | Admit: 2021-12-30 | Discharge: 2022-01-01 | Disposition: A | Discharge status: Home Health | Payer: Medicare Other | Attending: Internal Medicine | Admitting: Internal Medicine

## 2021-12-30 ENCOUNTER — Emergency Department (HOSPITAL_COMMUNITY): Payer: Medicare Other

## 2021-12-30 ENCOUNTER — Encounter (HOSPITAL_COMMUNITY): Payer: Self-pay | Admitting: Internal Medicine

## 2021-12-30 ENCOUNTER — Other Ambulatory Visit: Payer: Self-pay

## 2021-12-30 DIAGNOSIS — Z85038 Personal history of other malignant neoplasm of large intestine: Secondary | ICD-10-CM | POA: Insufficient documentation

## 2021-12-30 DIAGNOSIS — R0602 Shortness of breath: Secondary | ICD-10-CM | POA: Diagnosis present

## 2021-12-30 DIAGNOSIS — J9601 Acute respiratory failure with hypoxia: Principal | ICD-10-CM | POA: Diagnosis present

## 2021-12-30 DIAGNOSIS — I132 Hypertensive heart and chronic kidney disease with heart failure and with stage 5 chronic kidney disease, or end stage renal disease: Secondary | ICD-10-CM | POA: Diagnosis not present

## 2021-12-30 DIAGNOSIS — Z79899 Other long term (current) drug therapy: Secondary | ICD-10-CM | POA: Insufficient documentation

## 2021-12-30 DIAGNOSIS — R0902 Hypoxemia: Secondary | ICD-10-CM

## 2021-12-30 DIAGNOSIS — Z20822 Contact with and (suspected) exposure to covid-19: Secondary | ICD-10-CM | POA: Insufficient documentation

## 2021-12-30 DIAGNOSIS — F17210 Nicotine dependence, cigarettes, uncomplicated: Secondary | ICD-10-CM | POA: Insufficient documentation

## 2021-12-30 DIAGNOSIS — N186 End stage renal disease: Secondary | ICD-10-CM | POA: Insufficient documentation

## 2021-12-30 DIAGNOSIS — I5043 Acute on chronic combined systolic (congestive) and diastolic (congestive) heart failure: Secondary | ICD-10-CM | POA: Insufficient documentation

## 2021-12-30 DIAGNOSIS — E8770 Fluid overload, unspecified: Secondary | ICD-10-CM | POA: Diagnosis not present

## 2021-12-30 DIAGNOSIS — R2689 Other abnormalities of gait and mobility: Secondary | ICD-10-CM | POA: Diagnosis not present

## 2021-12-30 DIAGNOSIS — Z992 Dependence on renal dialysis: Secondary | ICD-10-CM | POA: Diagnosis not present

## 2021-12-30 LAB — COMPREHENSIVE METABOLIC PANEL
ALT: 14 U/L (ref 0–44)
AST: 24 U/L (ref 15–41)
Albumin: 2.7 g/dL — ABNORMAL LOW (ref 3.5–5.0)
Alkaline Phosphatase: 93 U/L (ref 38–126)
Anion gap: 13 (ref 5–15)
BUN: 44 mg/dL — ABNORMAL HIGH (ref 6–20)
CO2: 28 mmol/L (ref 22–32)
Calcium: 8.6 mg/dL — ABNORMAL LOW (ref 8.9–10.3)
Chloride: 94 mmol/L — ABNORMAL LOW (ref 98–111)
Creatinine, Ser: 6.26 mg/dL — ABNORMAL HIGH (ref 0.61–1.24)
GFR, Estimated: 10 mL/min — ABNORMAL LOW (ref >60)
Glucose, Bld: 75 mg/dL (ref 70–99)
Potassium: 4 mmol/L (ref 3.5–5.1)
Sodium: 135 mmol/L (ref 135–145)
Total Bilirubin: 1.1 mg/dL (ref 0.3–1.2)
Total Protein: 7.6 g/dL (ref 6.5–8.1)

## 2021-12-30 LAB — CBC WITH DIFFERENTIAL/PLATELET
Abs Immature Granulocytes: 0.03 10*3/uL (ref 0.00–0.07)
Basophils Absolute: 0 10*3/uL (ref 0.0–0.1)
Basophils Relative: 0 %
Eosinophils Absolute: 0.1 10*3/uL (ref 0.0–0.5)
Eosinophils Relative: 1 %
HCT: 32.1 % — ABNORMAL LOW (ref 39.0–52.0)
Hemoglobin: 9.8 g/dL — ABNORMAL LOW (ref 13.0–17.0)
Immature Granulocytes: 0 %
Lymphocytes Relative: 9 %
Lymphs Abs: 0.6 10*3/uL — ABNORMAL LOW (ref 0.7–4.0)
MCH: 27.4 pg (ref 26.0–34.0)
MCHC: 30.5 g/dL (ref 30.0–36.0)
MCV: 89.7 fL (ref 80.0–100.0)
Monocytes Absolute: 1.1 10*3/uL — ABNORMAL HIGH (ref 0.1–1.0)
Monocytes Relative: 16 %
Neutro Abs: 5 10*3/uL (ref 1.7–7.7)
Neutrophils Relative %: 74 %
Platelets: 238 10*3/uL (ref 150–400)
RBC: 3.58 MIL/uL — ABNORMAL LOW (ref 4.22–5.81)
RDW: 17.6 % — ABNORMAL HIGH (ref 11.5–15.5)
WBC: 6.8 10*3/uL (ref 4.0–10.5)
nRBC: 0 % (ref 0.0–0.2)

## 2021-12-30 LAB — RESP PANEL BY RT-PCR (FLU A&B, COVID) ARPGX2
Influenza A by PCR: NEGATIVE
Influenza B by PCR: NEGATIVE
SARS Coronavirus 2 by RT PCR: NEGATIVE

## 2021-12-30 LAB — BRAIN NATRIURETIC PEPTIDE: B Natriuretic Peptide: >4500 pg/mL — ABNORMAL HIGH (ref 0.0–100.0)

## 2021-12-30 LAB — TROPONIN I (HIGH SENSITIVITY)
Troponin I (High Sensitivity): 132 ng/L (ref <18)
Troponin I (High Sensitivity): 124 ng/L (ref <18)

## 2021-12-30 MED ORDER — CARVEDILOL 3.125 MG PO TABS
3.1250 mg | ORAL_TABLET | Freq: Two times a day (BID) | ORAL | Status: DC
  Filled 2021-12-30: qty 1

## 2021-12-30 MED ORDER — LIDOCAINE HCL (PF) 1 % IJ SOLN: 5.0000 mL | INTRAMUSCULAR | Status: DC | PRN

## 2021-12-30 MED ORDER — ACETAMINOPHEN 325 MG PO TABS
650.0000 mg | ORAL_TABLET | Freq: Four times a day (QID) | ORAL | Status: DC | PRN
  Administered 2021-12-31: 650 mg via ORAL
  Filled 2021-12-30: qty 2

## 2021-12-30 MED ORDER — OXYCODONE HCL 5 MG PO TABS
5.0000 mg | ORAL_TABLET | Freq: Once | ORAL | Status: AC
  Administered 2021-12-30: 5 mg via ORAL
  Filled 2021-12-30: qty 1

## 2021-12-30 MED ORDER — CHLORHEXIDINE GLUCONATE CLOTH 2 % EX PADS
6.0000 | MEDICATED_PAD | Freq: Every day | CUTANEOUS | Status: DC
  Administered 2021-12-31 – 2022-01-01 (×2): 6 via TOPICAL

## 2021-12-30 MED ORDER — LIDOCAINE-PRILOCAINE 2.5-2.5 % EX CREA
1.0000 "application " | TOPICAL_CREAM | CUTANEOUS | Status: DC | PRN
  Filled 2021-12-30: qty 5

## 2021-12-30 MED ORDER — SODIUM CHLORIDE 0.9 % IV SOLN: 100.0000 mL | INTRAVENOUS | Status: DC | PRN

## 2021-12-30 MED ORDER — HEPARIN SODIUM (PORCINE) 1000 UNIT/ML DIALYSIS
1000.0000 [IU] | INTRAMUSCULAR | Status: DC | PRN
  Filled 2021-12-30: qty 1

## 2021-12-30 MED ORDER — SENNOSIDES-DOCUSATE SODIUM 8.6-50 MG PO TABS: 1.0000 | ORAL_TABLET | Freq: Every evening | ORAL | Status: DC | PRN

## 2021-12-30 MED ORDER — ACETAMINOPHEN 650 MG RE SUPP: 650.0000 mg | Freq: Four times a day (QID) | RECTAL | Status: DC | PRN

## 2021-12-30 MED ORDER — ALTEPLASE 2 MG IJ SOLR: 2.0000 mg | Freq: Once | INTRAMUSCULAR | Status: DC | PRN

## 2021-12-30 MED ORDER — HEPARIN SODIUM (PORCINE) 5000 UNIT/ML IJ SOLN
5000.0000 [IU] | Freq: Three times a day (TID) | INTRAMUSCULAR | Status: DC
  Administered 2021-12-30 – 2021-12-31 (×3): 5000 [IU] via SUBCUTANEOUS
  Filled 2021-12-30 (×4): qty 1

## 2021-12-30 MED ORDER — PENTAFLUOROPROP-TETRAFLUOROETH EX AERO
1.0000 "application " | INHALATION_SPRAY | CUTANEOUS | Status: DC | PRN
  Filled 2021-12-30: qty 116

## 2021-12-30 MED ORDER — CARVEDILOL 3.125 MG PO TABS
3.1250 mg | ORAL_TABLET | Freq: Two times a day (BID) | ORAL | Status: DC
  Administered 2021-12-30 – 2022-01-01 (×4): 3.125 mg via ORAL
  Filled 2021-12-30 (×2): qty 1

## 2021-12-30 MED ORDER — ORAL CARE MOUTH RINSE
15.0000 mL | Freq: Two times a day (BID) | OROMUCOSAL | Status: DC
  Administered 2021-12-30 – 2022-01-01 (×2): 15 mL via OROMUCOSAL

## 2021-12-30 NOTE — ED Notes (Signed)
Pt placed on 2l [place] as sat dropped to 85% , and pt stated can't breath.  ?

## 2021-12-30 NOTE — Progress Notes (Signed)
NEW ADMISSION NOTE ?New Admission Note:  ? ?Arrival Method: stretcher ?Mental Orientation: A & O x4 ?Telemetry: no ?Assessment: Completed ?Skin: intact ?IV: not present ?Pain: denies ?Tubes: none ?Safety Measures: Safety Fall Prevention Plan has been given, discussed and signed ?Admission: Completed ?[facility] Orientation: Patient has been orientated to the room, unit and staff.  ?Family: notified by patient ? ?Orders have been reviewed and implemented. Will continue to monitor the patient. Call light has been placed within reach and bed alarm has been activated.  ? ?Berneta Levins, RN   ?

## 2021-12-30 NOTE — ED Provider Notes (Signed)
[facility] EMERGENCY DEPARTMENT Provider Note   CSN: [id number] Arrival date & time: 12/30/21  1058     History  Chief Complaint  Patient presents with   Shortness of Breath    Timothy [name] is a 61 y.o. male.  The history is provided by the patient and medical records. No language interpreter was used.  Shortness of Breath Severity:  Severe Onset quality:  Gradual Duration:  1 week Timing:  Constant Progression:  Partially resolved Chronicity:  Recurrent Context: URI   Relieved by:  Nothing Worsened by:  Coughing Ineffective treatments:  None tried Associated symptoms: cough   Associated symptoms: no abdominal pain (denies now but reported some earlier), no chest pain, no claudication, no diaphoresis, no fever, no headaches, no neck pain, no sputum production, no vomiting and no wheezing       Home Medications Prior to Admission medications   Medication Sig Start Date End Date Taking? Authorizing Provider  acetaminophen (TYLENOL) 325 MG tablet Take 2 tablets (650 mg total) by mouth every 6 (six) hours as needed for mild pain or moderate pain. 12/02/21   Almon Hercules, MD  bisacodyl (DULCOLAX) 10 MG suppository Place 1 suppository (10 mg total) rectally daily as needed for moderate constipation. 12/02/21   Almon Hercules, MD  carvedilol (COREG) 3.125 MG tablet Take 1 tablet (3.125 mg total) by mouth 2 (two) times daily with a meal. 12/02/21   Almon Hercules, MD  gabapentin (NEURONTIN) 100 MG capsule Take 1 capsule (100 mg total) by mouth daily. 12/02/21 01/31/22  Almon Hercules, MD  LIDOCAINE-PRILOCAINE EX Apply 1 application topically See admin instructions. Prior to dialysis Tuesday,Thursday and saturday    [provider]  multivitamin (RENA-VIT) TABS tablet Take 1 tablet by mouth at bedtime. 12/02/21   Almon Hercules, MD  pantoprazole (PROTONIX) 40 MG tablet Take 1 tablet (40 mg total) by mouth 2 (two) times daily. 12/02/21   Almon Hercules, MD   polyethylene glycol powder (MIRALAX) 17 GM/SCOOP powder Take 17 g by mouth 2 (two) times daily as needed for moderate constipation. 12/02/21   Almon Hercules, MD  sucralfate (CARAFATE) 1 g tablet Take 1 tablet (1 g total) by mouth 4 (four) times daily. 12/02/21 01/01/22  Almon Hercules, MD      Allergies    Patient has no active allergies.    Review of Systems   Review of Systems  Constitutional:  Positive for fatigue. Negative for chills, diaphoresis and fever.  HENT:  Positive for congestion.   Respiratory:  Positive for cough and shortness of breath. Negative for sputum production, chest tightness, wheezing and stridor.   Cardiovascular:  Positive for leg swelling. Negative for chest pain, palpitations and claudication.  Gastrointestinal:  Positive for nausea. Negative for abdominal pain (denies now but reported some earlier), constipation, diarrhea and vomiting.  Genitourinary:        Denies making urine  Musculoskeletal:  Negative for back pain, neck pain and neck stiffness.  Neurological:  Negative for light-headedness and headaches.  Psychiatric/Behavioral:  Negative for agitation and confusion.   All other systems reviewed and are negative.  Physical Exam Updated Vital Signs BP (!) 182/110   Pulse 89   Temp 98.3 F (36.8 C) (Oral)   Resp (!) 21   SpO2 95%  Physical Exam Vitals and nursing note reviewed.  Constitutional:      General: He is not in acute distress.    Appearance: He is  well-developed. He is ill-appearing. He is not toxic-appearing or diaphoretic.  HENT:     Head: Normocephalic and atraumatic.     Mouth/Throat:     Mouth: Mucous membranes are moist.  Eyes:     Conjunctiva/sclera: Conjunctivae normal.     Pupils: Pupils are equal, round, and reactive to light.  Cardiovascular:     Rate and Rhythm: Normal rate and regular rhythm.     Heart sounds: No murmur heard. Pulmonary:     Effort: Pulmonary effort is normal. Tachypnea present. No respiratory  distress.     Breath sounds: Rhonchi and rales present.  Chest:     Chest wall: No tenderness.  Abdominal:     Palpations: Abdomen is soft.     Tenderness: There is no abdominal tenderness.  Musculoskeletal:        General: No swelling.     Cervical back: Neck supple.     Right lower leg: Edema present.     Left lower leg: Edema present.  Skin:    General: Skin is warm and dry.     Capillary Refill: Capillary refill takes less than 2 seconds.     Findings: No erythema.  Neurological:     General: No focal deficit present.     Mental Status: He is alert.  Psychiatric:        Mood and Affect: Mood normal.    ED Results / Procedures / Treatments   Labs (all labs ordered are listed, but only abnormal results are displayed) Labs Reviewed  CBC WITH DIFFERENTIAL/PLATELET - Abnormal; Notable for the following components:      Result Value   RBC 3.58 (*)    Hemoglobin 9.8 (*)    HCT 32.1 (*)    RDW 17.6 (*)    Lymphs Abs 0.6 (*)    Monocytes Absolute 1.1 (*)    All other components within normal limits  COMPREHENSIVE METABOLIC PANEL - Abnormal; Notable for the following components:   Chloride 94 (*)    BUN 44 (*)    Creatinine, Ser 6.26 (*)    Calcium 8.6 (*)    Albumin 2.7 (*)    GFR, Estimated 10 (*)    All other components within normal limits  BRAIN NATRIURETIC PEPTIDE - Abnormal; Notable for the following components:   B Natriuretic Peptide >4,500.0 (*)    All other components within normal limits  TROPONIN I (HIGH SENSITIVITY) - Abnormal; Notable for the following components:   Troponin I (High Sensitivity) 132 (*)    All other components within normal limits  RESP PANEL BY RT-PCR (FLU A&B, COVID) [id number]  TROPONIN I (HIGH SENSITIVITY)    EKG EKG Interpretation  Date/Time:  Friday December 30 2021 11:01:59 EDT Ventricular Rate:  91 PR Interval:  168 QRS Duration: 86 QT Interval:  374 QTC Calculation: 460 R Axis:   39 Text Interpretation: Normal sinus rhythm  Cannot rule out Anterior infarct , age undetermined ST & T wave abnormality, consider inferior ischemia Abnormal ECG When compared with ECG of 17-Nov-2021 04:18, PREVIOUS ECG IS PRESENT when compared to prior, now sinus No STEMI Confirmed by Theda Belfast ([id number]) on 12/30/2021 11:15:53 AM  Radiology DG Chest 2 View  Result Date: 12/30/2021 CLINICAL DATA:  Worsening shortness of breath. EXAM: CHEST - 2 VIEW COMPARISON:  November 17, 2021 FINDINGS: Enlarged cardiomediastinal silhouette, unchanged from prior. Central vascular prominence. Mild bilateral interstitial opacities. No visible pleural effusion or pneumothorax. Elevation of the right hemidiaphragm. Embolization coils project over  the upper abdomen on the lateral view. No acute osseous abnormality. IMPRESSION: Enlarged cardiomediastinal silhouette with central vascular prominence and mild bilateral interstitial opacities, likely edema. Electronically Signed   By: Maudry Mayhew M.D.   On: 12/30/2021 11:30    Procedures Procedures    Medications Ordered in ED Medications  carvedilol (COREG) tablet 3.125 mg (has no administration in time range)    ED Course/ Medical Decision Making/ A&P                           Medical Decision Making Risk Prescription drug management. Decision regarding hospitalization.    Larson Raitt is a 61 y.o. male with a past medical history significant for CHF, hypertension, previous polysubstance abuse, ESRD on dialysis TTS, previous colon cancer, small bowel obstruction, and previous GI bleed who presents with shortness of breath, cough, fatigue, nausea, and some abdominal discomfort.  Patient says that for the last week he had worsening shortness of breath and fatigue.  He feels despite getting a full treatment of dialysis yesterday.  He says that he is having worsening shortness of breath today and was sent from his doctor's office.  He denies any chest pain or palpitations.  He denies any abdominal pain  at this time and denies nausea or vomiting further.  He reports he does not make urine and denies new constipation or diarrhea.  He reports he still passing gas and having bowel movements.  He reports his legs bilaterally and symmetrically are edematous worse than baseline and he feels he has too much fluid on board.  Before I saw the patient, he was found to be hypoxic with oxygen saturations in the mid 80s on room air and he was placed on 2 L to improve oxygen saturations to the low 90s.  On my exam, patient does have both rales and rhonchi in bilateral lungs.  Chest and abdomen were nontender.  He did have bowel sounds on exam.  He has edema in both legs but had intact sensation, strength, and pulses.  Patient is resting more comfortably but is still tachypneic despite oxygen.  He has a fistula in place in the left arm that does have a thrill and pulse.  He has distal pulses following that.  EKG did not show STEMI and is now sinus rhythm.  Clinically I am concerned about the new hypoxia, shortness of breath, and his symptoms of feeling fluid overloaded significantly.  We will get COVID swab, chest x-ray, and labs.  Some labs were already ordered in triage however due to the chest tightness and shortness of breath troponin was also added given his history of cardiac disease.  Chest x-ray shows fluid overload and a large pericardial silhouette.   When labs have returned, I suspect we will need to talk to nephrology for likely dialysis for this new shortness of breath and hypoxia.  I had a discussion with patient and offered imaging of his abdomen and pelvis due to his previous abdominal surgery and the reported abdominal pain with nausea and vomiting however as he says he is not worried about his abdomen at all and does not want imaging there.  He just wants "the fluid off" from his lungs as shortness of breath is his main concern.  We agreed to hold on imaging of the abdomen pelvis at this time.  1:17  PM Work-up continues to return.  Chest x-ray does show evidence of fluid overload with opacities.  No leukocytosis.  COVID still in process.  Troponin is elevated likely related to demand from what I suspect is fluid overload and hypoxia.  BNP elevated as expected with his dialysis use.  Potassium was not critically abnormal.  I spoke to nephrology who agreed that patient needs dialysis for the hypoxia, low blood pressures, and shortness of breath.  They will dialyze and they requested we admit to the hospital for further management.  We will call medicine for admission.  I asked about blood pressure that was trickling upward into the 180s and 190s and they said given his home blood pressure medicine initially to see if that helps.  If not, they agreed with the nitro drip.  We will call medicine for admission and nephrology will see for dialysis.         Final Clinical Impression(s) / ED Diagnoses Final diagnoses:  Hypoxia  Hypervolemia, unspecified hypervolemia type   Clinical Impression: 1. Hypoxia   2. Hypervolemia, unspecified hypervolemia type     Disposition: Admit  This note was prepared with assistance of Dragon voice recognition software. Occasional wrong-word or sound-a-like substitutions may have occurred due to the inherent limitations of voice recognition software.       Simone Tuckey, Canary Brim, MD 12/30/21 [phone]

## 2021-12-30 NOTE — ED Notes (Signed)
Patient transported to X-ray 

## 2021-12-30 NOTE — ED Triage Notes (Addendum)
Pt BIB [facility] from drs office c/o [facility] that started a couple of days ago. Pt also endorses some nausea and abdominal pain. Pt having difficulty staying awake.  ?

## 2021-12-30 NOTE — Consult Note (Signed)
[facility] ?Renal Consultation Note  ?  ?Indication for Consultation:  Management of ESRD/hemodialysis; anemia, hypertension/volume and secondary hyperparathyroidism ? ?[id number], Iona Beard, MD ? ?HPI: Timothy [name] is a 61 y.o. male with ESRD on HD TTS at [facility]. His past medical history is significant for malignant HTN, Hx polysubstance abuse, and Hep C. ? ?Patient presents to the ED today c/o bilateral leg swelling and SOB which started earlier this morning. Reviewed outpatient HD records. He received routine HD yesterday but only received 2.5hrs tx. He reports leaving treatment early d/t cramping. Unfortunately, this has been an ongoing issue for him. Seen and examined patient at bedside. Currently sitting up eating lunch. On 2L 02 sating 96% and he appears mildly labored. Noted high Bps (sys 180s). Notable labs include: Troponin 132, BNP >4500, K+ 4.0, SrCr 6.26, BUN 44, Ca 8.6, and Hgb 9.8. CXR shows enlarged cardiac silhouette with central vascular prominence and mild bilateral opacities consistent with edema. Plan for HD this afternoon and again tomorrow. ? ?Past Medical History:  ?Diagnosis Date  ? Anemia of chronic kidney failure   ? BPH (benign prostatic hyperplasia)   ? Colon cancer [facility]) 2014  ? spouse states he had surgury for colon CA  ? End stage renal disease on dialysis [facility]) 2017  ? Hypertension   ? Hypertensive heart disease with chronic diastolic congestive heart failure ([place]) 07/17/2016  ? Polysubstance abuse ([place])   ? History of heroin and marijuana use  ? ?Past Surgical History:  ?Procedure Laterality Date  ? A/V FISTULAGRAM Left 12/02/2021  ? Procedure: A/V Fistulagram;  Surgeon: Cherre Robins, MD;  Location: [place] [facility];  Service: Cardiovascular;  Laterality: Left;  ? ABDOMINAL SURGERY    ? AV FISTULA PLACEMENT Left 09/19/2016  ? Procedure: Left arm Radiocephalic ARTERIOVENOUS (AV) FISTULA CREATION;  Surgeon: Conrad Paola, MD;  Location: [place];  Service: Vascular;  Laterality: Left;  ? BASCILIC VEIN TRANSPOSITION Left 07/09/2017  ? Procedure: BRACHIOCEPHALIC FISTULA CREATION;  Surgeon: Conrad Egypt Lake-Leto, MD;  Location: [place];  Service: Vascular;  Laterality: Left;  ? COLON SURGERY  2014  ? ESOPHAGOGASTRODUODENOSCOPY (EGD) WITH PROPOFOL N/A 11/05/2021  ? Procedure: ESOPHAGOGASTRODUODENOSCOPY (EGD) WITH PROPOFOL;  Surgeon: Carol Ada, MD;  Location: [place];  Service: Endoscopy;  Laterality: N/A;  ? ESOPHAGOGASTRODUODENOSCOPY (EGD) WITH PROPOFOL N/A 11/14/2021  ? Procedure: ESOPHAGOGASTRODUODENOSCOPY (EGD) WITH PROPOFOL;  Surgeon: [name], MD;  Location: [place];  Service: Gastroenterology;  Laterality: N/A;  ? ESOPHAGOGASTRODUODENOSCOPY (EGD) WITH PROPOFOL N/A 11/16/2021  ? Procedure: ESOPHAGOGASTRODUODENOSCOPY (EGD) WITH PROPOFOL;  Surgeon: [name], MD;  Location: [place];  Service: Gastroenterology;  Laterality: N/A;  ? HEMOSTASIS CLIP PLACEMENT  11/16/2021  ? Procedure: HEMOSTASIS CLIP PLACEMENT;  Surgeon: [name], MD;  Location: [place];  Service: Gastroenterology;;  ? HEMOSTASIS CONTROL  11/05/2021  ? Procedure: HEMOSTASIS CONTROL;  Surgeon: Carol Ada, MD;  Location: [place];  Service: Endoscopy;;  ? HEMOSTASIS CONTROL  11/16/2021  ? Procedure: HEMOSTASIS CONTROL;  Surgeon: [name], MD;  Location: [place];  Service: Gastroenterology;;  ? HOT HEMOSTASIS N/A 11/16/2021  ? Procedure: HOT HEMOSTASIS (ARGON PLASMA COAGULATION/BICAP);  Surgeon: [name], MD;  Location: [place];  Service: Gastroenterology;  Laterality: N/A;  ? INSERTION OF DIALYSIS CATHETER N/A 09/19/2016  ? Procedure: INSERTION OF TUNNELED DIALYSIS CATHETER;  Surgeon: Conrad [place]irie, MD;  Location: [place];  Service: Vascular;  Laterality: N/A;  ? [place]  ADDITIONAL VESSEL  11/16/2021  ? IR ANGIOGRAM VISCERAL SELECTIVE  11/05/2021  ? IR ANGIOGRAM VISCERAL SELECTIVE  11/16/2021  ? IR ANGIOGRAM VISCERAL SELECTIVE   11/16/2021  ? IR EMBO ART  VEN HEMORR LYMPH EXTRAV  INC GUIDE ROADMAPPING  11/05/2021  ? IR EMBO ART  VEN HEMORR LYMPH EXTRAV  INC GUIDE ROADMAPPING  11/16/2021  ? IR GENERIC HISTORICAL  09/14/2016  ? IR US GUIDE VASC ACCESS RIGHT 09/14/2016 [name], DO [facility]  ? IR GENERIC HISTORICAL  09/14/2016  ? IR FLUORO GUIDE CV LINE RIGHT 09/14/2016 [name], DO [facility]  ? IR PARACENTESIS  06/22/2021  ? IR US GUIDE VASC ACCESS RIGHT  11/05/2021  ? IR US GUIDE VASC ACCESS RIGHT  11/16/2021  ? LAPAROTOMY N/A 05/23/2021  ? Procedure: EXPLORATORY LAPAROTOMY LYSIS ADHESIONS;  Surgeon: [name], MD;  Location: [place];  Service: General;  Laterality: N/A;  ? LAPAROTOMY N/A 11/20/2021  ? Procedure: EXPLORATORY LAPAROTOMY, REPAIR OF BLEEDING DUODENAL ULCER;  Surgeon: Dwan Bolt, MD;  Location: [place];  Service: General;  Laterality: N/A;  ? ORIF TIBIA PLATEAU Left 01/15/2018  ? Procedure: OPEN REDUCTION INTERNAL FIXATION (ORIF) TIBIAL PLATEAU;  Surgeon: Altamese Olimpo, MD;  Location: [place];  Service: Orthopedics;  Laterality: Left;  ? SCLEROTHERAPY  11/05/2021  ? Procedure: SCLEROTHERAPY;  Surgeon: Carol Ada, MD;  Location: [place];  Service: Endoscopy;;  ? SCLEROTHERAPY  11/16/2021  ? Procedure: SCLEROTHERAPY;  Surgeon: [name], MD;  Location: [facility] ENDOSCOPY;  Service: Gastroenterology;;  ? TRANSTHORACIC ECHOCARDIOGRAM  07/2016  ?  EF 60-65%, No RWMA. Mod Concentric LVH - Gr 2 DD. Severe LA dilation. PAP ~35 mmHg (mild Pulm HTN)  --> no changes noted 1 month later  ? ?Family History  ?Problem Relation Age of Onset  ? Heart failure Mother   ?     Died at age 48.  ? Heart attack Mother 37  ? Hypertension Mother   ? Diabetes Mellitus II Mother   ? Other Father   ?     Unknown  ? Kidney failure Sister   ?     (Oldest Sister)  ? Other Other   ?     Multiple siblings have started her heart disease, he is not sure of the details.  ? CAD Nephew   ? ?Social History: ? reports that he has been smoking cigarettes.  He has been smoking an average of .25 packs per day. He has never used smokeless tobacco. He reports current drug use. Frequency: 1.00 time per week. Drugs: Marijuana and Heroin. He reports that he does not drink alcohol. ?No Active Allergies ?Prior to Admission medications   ?Medication Sig Start Date End Date Taking? Authorizing Provider  ?acetaminophen (TYLENOL) 325 MG tablet Take 2 tablets (650 mg total) by mouth every 6 (six) hours as needed for mild pain or moderate pain. 12/02/21   Mercy Riding, MD  ?bisacodyl (DULCOLAX) 10 MG suppository Place 1 suppository (10 mg total) rectally daily as needed for moderate constipation. 12/02/21   Mercy Riding, MD  ?carvedilol (COREG) 3.125 MG tablet Take 1 tablet (3.125 mg total) by mouth 2 (two) times daily with a meal. 12/02/21   Mercy Riding, MD  ?gabapentin (NEURONTIN) 100 MG capsule Take 1 capsule (100 mg total) by mouth daily. 12/02/21 01/31/22  Mercy Riding, MD  ?LIDOCAINE-PRILOCAINE EX Apply 1 application topically See admin instructions. Prior to dialysis Tuesday,Thursday and saturday    [provider]  ?  multivitamin (RENA-VIT) TABS tablet Take 1 tablet by mouth at bedtime. 12/02/21   Mercy Riding, MD  ?pantoprazole (PROTONIX) 40 MG tablet Take 1 tablet (40 mg total) by mouth 2 (two) times daily. 12/02/21   Mercy Riding, MD  ?polyethylene glycol powder (MIRALAX) 17 GM/SCOOP powder Take 17 g by mouth 2 (two) times daily as needed for moderate constipation. 12/02/21   Mercy Riding, MD  ?sucralfate (CARAFATE) 1 g tablet Take 1 tablet (1 g total) by mouth 4 (four) times daily. 12/02/21 01/01/22  Mercy Riding, MD  ? ?Current Facility-Administered Medications  ?Medication Dose Route Frequency Provider Last Rate Last Admin  ? 0.9 %  sodium chloride infusion  100 mL Intravenous PRN Adelfa Koh, NP      ? 0.9 %  sodium chloride infusion  100 mL Intravenous PRN Adelfa Koh, NP      ? alteplase (CATHFLO ACTIVASE) injection 2 mg  2 mg Intracatheter  Once PRN Adelfa Koh, NP      ? carvedilol (COREG) tablet 3.125 mg  3.125 mg Oral BID WC Tegeler, Gwenyth Allegra, MD   3.125 mg at 12/30/21 1330  ? [START ON 12/31/2021] Chlorhexidine Gluconate Clot

## 2021-12-30 NOTE — ED Provider Triage Note (Signed)
Emergency Medicine Provider Triage Evaluation Note ? ?Timothy [name] , a 61 y.o. male  was evaluated in triage.  Pt complains of shob worsening over the last week and a half, fevers, chills, increased sputum production. Dialysis yesterday full dialyzed. Endorses some nausea, vomiting, minimal abdominal pain. No hematemesis, chest pain. He presents from vascular, he was supposed to have his fistula looked at due to some kind of an issue today. ? ?Review of Systems  ?Positive: Shob, weakness, nausea, vomiting ?Negative: Chest pain ? ?Physical Exam  ?BP (!) 169/108 (BP Location: Right Arm)   Pulse 89   Temp 98.3 ?F (36.8 ?C) (Oral)   Resp (!) 24   SpO2 95%  ?Gen:   Awake, somewhat somnolent, ill-appearing ?Resp:  Decreased resp effort, wheezing throughout lung fields, increased WOB ?MSK:   Moves extremities without difficulty  ?Other:  Some TTP throughout abdomen, nothing focal noted ?Fistula with thrill in LUE ? ?Medical Decision Making  ?Medically screening exam initiated at 11:02 AM.  Appropriate orders placed.  Timothy [name] was informed that the remainder of the evaluation will be completed by another provider, this initial triage assessment does not replace that evaluation, and the importance of remaining in the ED until their evaluation is complete. ? ?Workup initiated ?  ?Anselmo Pickler, PA-C ?12/30/21 1105 ? ?

## 2021-12-30 NOTE — H&P (Signed)
? ? ? ?Date: 12/30/2021     ?     ?     ?Patient Name:  Timothy [name] MRN: [id number]  ?DOB: 1961/08/07 Age / Sex: 61 y.o., male   ?PCP: [name], MD    ?     ?Medical Service: Internal Medicine Teaching Service    ?     ?Attending Physician: Dr. Lottie Mussel, MD    ?First Contact: Dr. Raymondo Band Pager: [phone]  ?Second Contact: Dr. Coy Saunas  Pager: [phone]  ?     ?After Hours (After 5p/  First Contact Pager: [phone]  ?weekends / holidays): Second Contact Pager: [phone]  ? ?Chief Complaint: shortness of breath ? ?History of Present Illness: Timothy [name] is a 61 yo male with CHF, hypertension, ESRD on HD T/Th/S, previous hx of colon cancer, previous SBO, and polysubstance use who presents to the hospital with shortness of breath. ? ?The patient states that he received his usual outpatient HD yesterday, although it was not a complete session as he developed cramping. He has had multiple incomplete HD sessions recently due to cramping and notes that this has been an ongoing issue for him. The patient reports that this morning, he could not catch his breath and knew that he had too much fluid on him, which prompted him to come to the hospital. He has also had ongoing swelling in his bilateral lower extremities that has been progressively worsening over the last 2 weeks. His lower extremity swelling is now causing him pain, which has limited his ability to ambulate. The patient denies any fevers, chills, blurry vision, headache, chest pain, palpitations, abd pain, n/v/d, constipation, diarrhea, or any other symptoms at this time. The patient notes that he has been eating and drinking normally and has had no recent dietary changes. No recent sick contacts. He no longer makes urine, as he has been on dialysis for ~6 years.  ? ? ?Meds:  ?Tylenol ?Coreg 3.125 mg bid ?Gabapentin 100 mg daily ?Protonix 40 mg bid ?Miralax prn  ?Carafate 1 g qid  ?No outpatient medications have been marked as taking for the 12/30/21  encounter [facility] Encounter).  ? ? ? ?Allergies: ?Allergies as of 12/30/2021  ? (No Active Allergies)  ? ?Past Medical History:  ?Diagnosis Date  ? Anemia of chronic kidney failure   ? BPH (benign prostatic hyperplasia)   ? Colon cancer [facility]) 2014  ? spouse states he had surgury for colon CA  ? End stage renal disease on dialysis [facility]) 2017  ? Hypertension   ? Hypertensive heart disease with chronic diastolic congestive heart failure ([place]) 07/17/2016  ? Polysubstance abuse ([place])   ? History of heroin and marijuana use  ? ? ?Family History: Cardiovascular disease, diabetes, kidney failure (sister) ? ?Social History: Lives in [place] with his wife. Usually independent of ADL/IADLs but has needed more assistance recently because of his leg swelling/pain.  ?Patient has a PCP but does not know his name. ?Smokes 1-2 cigarettes per day for 20+ years ?Marijuana use a few times per week ?No alcohol use ? ?Review of Systems: ?A complete ROS was negative except as per HPI.  ? ?Physical Exam: ?Blood pressure (!) 177/115, pulse 89, temperature 98.3 ?F (36.8 ?C), resp. rate (!) 27, weight 75.8 kg, SpO2 93 %. ? ?General: Chronically ill-appearing male laying in bed. No acute distress, but appears uncomfortable. ?CV: RRR. No murmurs, rubs, or gallops. 1-2+ bilateral lower extremity edema.  ?Pulmonary: Tachypnea. Patient with labored respirations. Bilateral crackles appreciated and  some expiratory wheezing appreciated.  ?Abdominal: Soft, non-tender. Mildly distended abdomen. Normal bowel sounds. ?Extremities: Palpable radial and DP pulses. L AVF with bruit.  ?Skin: Warm and dry.  ?Neuro: A&Ox3. Moves all extremities. Normal sensation. No focal deficit. ?Psych: Normal mood and affect ? ? ?EKG: personally reviewed my interpretation is normal sinus rhythm (changed compared to prior EKG from February which showed Afib with RVR) and T wave depressions in I and aVL (not new). ? ?CXR: personally reviewed my interpretation is bilateral  interstitial opacities, consistent with edema/overload ? ?CMP  ?   ?Component Value Date/Time  ? NA 135 12/30/2021 1110  ? K 4.0 12/30/2021 1110  ? CL 94 (L) 12/30/2021 1110  ? CO2 28 12/30/2021 1110  ? GLUCOSE 75 12/30/2021 1110  ? BUN 44 (H) 12/30/2021 1110  ? CREATININE 6.26 (H) 12/30/2021 1110  ? CREATININE 1.59 (H) 07/29/2015 0943  ? CALCIUM 8.6 (L) 12/30/2021 1110  ? CALCIUM 8.4 (L) 09/14/2016 1615  ? PROT 7.6 12/30/2021 1110  ? ALBUMIN 2.7 (L) 12/30/2021 1110  ? AST 24 12/30/2021 1110  ? ALT 14 12/30/2021 1110  ? ALKPHOS 93 12/30/2021 1110  ? BILITOT 1.1 12/30/2021 1110  ? GFRNONAA 10 (L) 12/30/2021 1110  ? GFRNONAA 48 (L) 07/29/2015 0943  ? GFRAA 7 (L) 05/18/2019 2220  ? GFRAA 56 (L) 07/29/2015 0943  ? ?CBC ?   ?Component Value Date/Time  ? WBC 6.8 12/30/2021 1110  ? RBC 3.58 (L) 12/30/2021 1110  ? HGB 9.8 (L) 12/30/2021 1110  ? HCT 32.1 (L) 12/30/2021 1110  ? PLT 238 12/30/2021 1110  ? MCV 89.7 12/30/2021 1110  ? MCH 27.4 12/30/2021 1110  ? MCHC 30.5 12/30/2021 1110  ? RDW 17.6 (H) 12/30/2021 1110  ? LYMPHSABS 0.6 (L) 12/30/2021 1110  ? MONOABS 1.1 (H) 12/30/2021 1110  ? EOSABS 0.1 12/30/2021 1110  ? BASOSABS 0.0 12/30/2021 1110  ? ?BNP ?   ?Component Value Date/Time  ? BNP >4,500.0 (H) 12/30/2021 1110  ? ? ? ? ?Assessment & Plan by Problem: ?Active Problems: ?  Acute respiratory failure with hypoxia ([place]) ? ?#Acute hypoxic respiratory failure  ?#Acute on chronic HFmrEF ?Patient has a history of ESRD on HD and HFmrEF (last EF 40-45% in December). He presented with shortness of breath x 1 day and bilateral lower extremity edema secondary to volume overload. Volume overload is likely secondary to incomplete HD sessions recently and could also be secondary to a heart failure exacerbation. In the ED, labs were significant for a BNP of >4500. No leukocytosis and respiratory panel was negative for COVID and influenza. Initial troponin level was elevated to 132 with delta troponin still pending- although I suspect  that this is secondary to demand ischemia as EKG shows normal sinus rhythm with no new T wave changes compared to prior EKGs. At home, patient is supposed to be on coreg 3.125 mg bid for his heart failure, but he notes that he is on cardizem 360 mg daily (per records, this med was discontinued in December).  ?- Resume coreg 3.125 mg bid ?- Fluid removal in HD, as noted below ?- Daily BMP ?- Maintain O2 sat >92% ? ?#ESRD on HD T/Th/S ?Patient reports that he has been on dialysis for ~6 years. He has not missed any HD sessions recently, but has not been able to receive complete treatments due to ongoing cramping issues. He last received an incomplete session on 3/16 (Thurs) and was taken to dialysis from the ED with goals  to push UF. ?- Nephrology consulted, appreciate recs ?- Plan for HD today and tomorrow  ?- Daily CBC ?- Avoid nephrotoxins ? ?#Anemia of chronic kidney disease ?Hb on admission 9.8 (up from 7.3 two weeks ago). The patient denies any melena, hematochezia, or any other signs of bleeding. ?- Daily CBC ?- ESA per nephrology ? ?#Hypertension ?Blood pressure was elevated on arrival with SBP up to the 180s, likely secondary to volume overload. At home, patient is on coreg 3.125 mg bid. Suspect that BP will improve with HD.  ?- Resume coreg 3.125 mg bid ? ?Best practices: ?Code: DNR ?VTE: Heparin ?Diet: Renal ? ?Dispo: Admit patient to Observation with expected length of stay less than 2 midnights. ? ?Signed: ?Dorethea Clan, DO ?12/30/2021, 4:02 PM  ?Pager: [phone] ?After 5pm on weekdays and 1pm on weekends: On Call pager: [phone] ? ?

## 2021-12-30 NOTE — Plan of Care (Signed)
  Problem: Clinical Measurements: Goal: Cardiovascular complication will be avoided Outcome: Progressing   Problem: Clinical Measurements: Goal: Respiratory complications will improve Outcome: Not Progressing   Problem: Activity: Goal: Risk for activity intolerance will decrease Outcome: Not Progressing   

## 2021-12-31 DIAGNOSIS — J9601 Acute respiratory failure with hypoxia: Secondary | ICD-10-CM

## 2021-12-31 DIAGNOSIS — E8770 Fluid overload, unspecified: Secondary | ICD-10-CM | POA: Diagnosis not present

## 2021-12-31 LAB — CBC
HCT: 31.2 % — ABNORMAL LOW (ref 39.0–52.0)
Hemoglobin: 9.7 g/dL — ABNORMAL LOW (ref 13.0–17.0)
MCH: 27.7 pg (ref 26.0–34.0)
MCHC: 31.1 g/dL (ref 30.0–36.0)
MCV: 89.1 fL (ref 80.0–100.0)
Platelets: 233 10*3/uL (ref 150–400)
RBC: 3.5 MIL/uL — ABNORMAL LOW (ref 4.22–5.81)
RDW: 17.3 % — ABNORMAL HIGH (ref 11.5–15.5)
WBC: 6.7 10*3/uL (ref 4.0–10.5)
nRBC: 0 % (ref 0.0–0.2)

## 2021-12-31 LAB — HEPATITIS B SURFACE ANTIGEN: Hepatitis B Surface Ag: NONREACTIVE

## 2021-12-31 LAB — BASIC METABOLIC PANEL
Anion gap: 12 (ref 5–15)
BUN: 32 mg/dL — ABNORMAL HIGH (ref 6–20)
CO2: 26 mmol/L (ref 22–32)
Calcium: 8.7 mg/dL — ABNORMAL LOW (ref 8.9–10.3)
Chloride: 96 mmol/L — ABNORMAL LOW (ref 98–111)
Creatinine, Ser: 5.12 mg/dL — ABNORMAL HIGH (ref 0.61–1.24)
GFR, Estimated: 12 mL/min — ABNORMAL LOW (ref >60)
Glucose, Bld: 122 mg/dL — ABNORMAL HIGH (ref 70–99)
Potassium: 3.9 mmol/L (ref 3.5–5.1)
Sodium: 134 mmol/L — ABNORMAL LOW (ref 135–145)

## 2021-12-31 LAB — HEPATITIS B SURFACE ANTIBODY,QUALITATIVE: Hep B S Ab: REACTIVE — AB

## 2021-12-31 MED ORDER — GABAPENTIN 100 MG PO CAPS
100.0000 mg | ORAL_CAPSULE | Freq: Every day | ORAL | Status: DC
  Administered 2021-12-31: 100 mg via ORAL
  Filled 2021-12-31: qty 1

## 2021-12-31 MED ORDER — SODIUM CHLORIDE 0.9 % IV SOLN: 100.0000 mL | INTRAVENOUS | Status: DC | PRN

## 2021-12-31 MED ORDER — PENTAFLUOROPROP-TETRAFLUOROETH EX AERO: 1.0000 "application " | INHALATION_SPRAY | CUTANEOUS | Status: DC | PRN

## 2021-12-31 MED ORDER — LIDOCAINE HCL (PF) 1 % IJ SOLN: 5.0000 mL | INTRAMUSCULAR | Status: DC | PRN

## 2021-12-31 MED ORDER — LIDOCAINE-PRILOCAINE 2.5-2.5 % EX CREA: 1.0000 "application " | TOPICAL_CREAM | CUTANEOUS | Status: DC | PRN

## 2021-12-31 MED ORDER — ALTEPLASE 2 MG IJ SOLR: 2.0000 mg | Freq: Once | INTRAMUSCULAR | Status: DC | PRN

## 2021-12-31 MED ORDER — HEPARIN SODIUM (PORCINE) 1000 UNIT/ML DIALYSIS: 1000.0000 [IU] | INTRAMUSCULAR | Status: DC | PRN

## 2021-12-31 NOTE — Progress Notes (Signed)
OT Cancellation Note ? ?Patient Details ?Name: Timothy [name] ?MRN: [id number] ?DOB: 1961/09/02 ? ? ?Cancelled Treatment:    Reason Eval/Treat Not Completed: Patient at procedure or test/ unavailable. Pt off floor for HD, OT will follow up as time allows.  ? ?Timothy Havlicek H., Timothy [name] ?Acute Rehabilitation ? ?[name] ?12/31/2021, 1:29 PM ?

## 2021-12-31 NOTE — Evaluation (Signed)
Physical Therapy Evaluation ?Patient Details ?Name: Timothy [name] ?MRN: [id number] ?DOB: 1961/03/12 ?Today's Date: 12/31/2021 ? ?History of Present Illness ? 61 y/o male presented to ED on 12/30/21 for SOB. Found to be in acute hypoxic respiratory failure and acute on chronic CHF. PMH: ESRD (HD TTS), colon cancer, CHF, polysubstance abuse  ?Clinical Impression ? Patient admitted with above findings. Patient presents with generalized weakness, impaired balance, decreased activity tolerance. Patient ambulating with cane and min guard. Ambulated on RA with spO2 >95% throughout. Patient will benefit from skilled PT services during acute stay to address listed deficits. Recommend HHPT at discharge to maximize functional independence and safety.    ?   ? ?Recommendations for follow up therapy are one component of a multi-disciplinary discharge planning process, led by the attending physician.  Recommendations may be updated based on patient status, additional functional criteria and insurance authorization. ? ?Follow Up Recommendations Home health PT ? ?  ?Assistance Recommended at Discharge Intermittent Supervision/Assistance  ?Patient can return home with the following ?   ? ?  ?Equipment Recommendations None recommended by PT  ?Recommendations for Other Services ?    ?  ?Functional Status Assessment Patient has had a recent decline in their functional status and demonstrates the ability to make significant improvements in function in a reasonable and predictable amount of time.  ? ?  ?Precautions / Restrictions Precautions ?Precautions: Fall ?Restrictions ?Weight Bearing Restrictions: No  ? ?  ? ?Mobility ? Bed Mobility ?Overal bed mobility: Needs Assistance ?Bed Mobility: Supine to Sit, Sit to Supine ?  ?  ?Supine to sit: Supervision ?Sit to supine: Min assist ?  ?General bed mobility comments: minA for bringing LEs back to bed ?  ? ?Transfers ?Overall transfer level: Needs assistance ?Equipment used: Straight  cane ?Transfers: Sit to/from Stand ?Sit to Stand: Supervision ?  ?  ?  ?  ?  ?General transfer comment: supervision for safety ?  ? ?Ambulation/Gait ?Ambulation/Gait assistance: Min guard ?Gait Distance (Feet): 40 Feet ?Assistive device: Straight cane ?Gait Pattern/deviations: Step-through pattern, Decreased stride length ?Gait velocity: decreased ?  ?  ?General Gait Details: seems to be ambulating at baseline. Complaining of bilateral LE pain due to edema. Min guard for safety due to balance deficits. ? ?Stairs ?  ?  ?  ?  ?  ? ?Wheelchair Mobility ?  ? ?Modified Rankin (Stroke Patients Only) ?  ? ?  ? ?Balance Overall balance assessment: Mild deficits observed, not formally tested ?  ?  ?  ?  ?  ?  ?  ?  ?  ?  ?  ?  ?  ?  ?  ?  ?  ?  ?   ? ? ? ?Pertinent Vitals/Pain Pain Assessment ?Pain Assessment: No/denies pain  ? ? ?Home Living Family/patient expects to be discharged to:: Private residence ?Living Arrangements: Spouse/significant other ?Available Help at Discharge: Family;Available PRN/intermittently ?Type of Home: Apartment ?Home Access: Stairs to enter ?Entrance Stairs-Rails: Left ?Entrance Stairs-Number of Steps: 2 flights to 3rd story apt ?  ?Home Layout: One level ?Home Equipment: Shower seat;Cane - single point ?   ?  ?Prior Function Prior Level of Function : Independent/Modified Independent ?  ?  ?  ?  ?  ?  ?  ?  ?  ? ? ?Hand Dominance  ?   ? ?  ?Extremity/Trunk Assessment  ? Upper Extremity Assessment ?Upper Extremity Assessment: Defer to OT evaluation ?  ? ?Lower Extremity Assessment ?Lower Extremity Assessment: Generalized  weakness ?  ? ?Cervical / Trunk Assessment ?Cervical / Trunk Assessment: Kyphotic  ?Communication  ? Communication: No difficulties  ?Cognition Arousal/Alertness: Awake/alert ?Behavior During Therapy: [facility] for tasks assessed/performed ?Overall Cognitive Status: Within Functional Limits for tasks assessed ?  ?  ?  ?  ?  ?  ?  ?  ?  ?  ?  ?  ?  ?  ?  ?  ?  ?  ?  ? ?  ?General  Comments General comments (skin integrity, edema, etc.): Ambulated on RA with spO2 maintaining >95% ? ?  ?Exercises    ? ?Assessment/Plan  ?  ?PT Assessment Patient needs continued PT services  ?PT Problem List Decreased strength;Decreased activity tolerance;Decreased balance;Decreased mobility;Decreased coordination;Cardiopulmonary status limiting activity ? ?   ?  ?PT Treatment Interventions DME instruction;Gait training;Functional mobility training;Therapeutic activities;Therapeutic exercise;Balance training;Patient/family education   ? ?PT Goals (Current goals can be found in the Care Plan section)  ?Acute Rehab PT Goals ?Patient Stated Goal: to go home ?PT Goal Formulation: With patient ?Time For Goal Achievement: 01/14/22 ?Potential to Achieve Goals: Good ? ?  ?Frequency Min 3X/week ?  ? ? ?Co-evaluation   ?  ?  ?  ?  ? ? ?  ?AM-PAC PT "6 Clicks" Mobility  ?Outcome Measure Help needed turning from your back to your side while in a flat bed without using bedrails?: A Little ?Help needed moving from lying on your back to sitting on the side of a flat bed without using bedrails?: A Little ?Help needed moving to and from a bed to a chair (including a wheelchair)?: A Little ?Help needed standing up from a chair using your arms (e.g., wheelchair or bedside chair)?: A Little ?Help needed to walk in hospital room?: A Little ?Help needed climbing 3-5 steps with a railing? : A Little ?6 Click Score: 18 ? ?  ?End of Session   ?Activity Tolerance: Patient tolerated treatment well ?Patient left: in bed;with call bell/phone within reach ?Nurse Communication: Mobility status ?PT Visit Diagnosis: Unsteadiness on feet (R26.81);Muscle weakness (generalized) (M62.81) ?  ? ?Time: 5621-3086 ?PT Time Calculation (min) (ACUTE ONLY): 8 min ? ? ?Charges:   PT Evaluation ?$PT Eval Moderate Complexity: 1 Mod ?  ?  ?   ? ? ?Atticus Lemberger A. Gilford Rile, PT, DPT ?Acute Rehabilitation Services ?Pager [phone] ?Office [phone] ? ? ?Zayden Maffei A  Kenyada Dosch ?12/31/2021, 10:57 AM ? ?

## 2021-12-31 NOTE — Hospital Course (Signed)
Feels better this morning off O2 and sitting on a chair. His SOB has improved and the leg swelling is a "a little better". Leg pain is also better. Informed by Nephrologist of plans for 3 hrs of HD today. Tolerating po intake.  ?

## 2021-12-31 NOTE — Progress Notes (Signed)
?[facility] ?Progress Note  ? ?Subjective:    ?Seen and examined patient at bedside. Patient sitting up eating breakfast. Inquiring about going home. Tolerated yestrday's HD with net UF 4L. Plan for short sequential HD today. ? ?Objective ?Vitals:  ? 12/30/21 1814 12/30/21 1816 12/30/21 2100 12/31/21 1950  ?BP: (!) 169/104  (!) 152/100 (!) 175/125  ?Pulse: 89  92 (!) 102  ?Resp: 18  18   ?Temp: 98 ?F (36.7 ?C)  98 ?F (36.7 ?C) 98.4 ?F (36.9 ?C)  ?TempSrc:   Oral Oral  ?SpO2: 99%  98% 100%  ?Weight:  84.7 kg    ?Height:  '6\' 1"'$  (1.854 m)    ? ?Physical Exam ?General: Chronically ill-appearing, noted on 4L O2 [place] ?Lungs: Breathing improved, diminished at bases, scattered rhonchi throughout ?Heart: RRR. No murmur, rubs or gallops.  ?Abdomen: round, distended, soft, nontender, +BS, no rebound tenderness  ?Lower extremities: 1-2+ edema (L leg>R leg); Noted mild edema LUE ?Neuro: AAOx3. Moves all extremities spontaneously. ?Dialysis Access: L AVF (+) B/T ? ?Filed Weights  ? 12/30/21 1400 12/30/21 1816  ?Weight: 75.8 kg 84.7 kg  ? ? ?Intake/Output Summary (Last 24 hours) at 12/31/2021 0841 ?Last data filed at 12/30/2021 1731 ?Gross per 24 hour  ?Intake --  ?Output 4000 ml  ?Net -4000 ml  ? ? ?Additional Objective ?Labs: ?Basic Metabolic Panel: ?Recent Labs  ?Lab 12/30/21 ?1110  ?NA 135  ?K 4.0  ?CL 94*  ?CO2 28  ?GLUCOSE 75  ?BUN 44*  ?CREATININE 6.26*  ?CALCIUM 8.6*  ? ?Liver Function Tests: ?Recent Labs  ?Lab 12/30/21 ?1110  ?AST 24  ?ALT 14  ?ALKPHOS 93  ?BILITOT 1.1  ?PROT 7.6  ?ALBUMIN 2.7*  ? ?No results for input(s): LIPASE, AMYLASE in the last 168 hours. ?CBC: ?Recent Labs  ?Lab 12/30/21 ?1110  ?WBC 6.8  ?NEUTROABS 5.0  ?HGB 9.8*  ?HCT 32.1*  ?MCV 89.7  ?PLT 238  ? ?Blood Culture ?   ?Component Value Date/Time  ? SDES PERITONEAL FLUID 06/22/2021 1236  ? [place] ABDOMEN 06/22/2021 1236  ? CULT  06/22/2021 1236  ?  NO GROWTH 3 DAYS ?Performed at [facility], [place] [address], [place],  [place] [address] ?  ? REPTSTATUS 06/26/2021 FINAL 06/22/2021 1236  ? ? ?Cardiac Enzymes: ?No results for input(s): CKTOTAL, CKMB, CKMBINDEX, TROPONINI in the last 168 hours. ?CBG: ?No results for input(s): GLUCAP in the last 168 hours. ?Iron Studies: No results for input(s): IRON, TIBC, TRANSFERRIN, FERRITIN in the last 72 hours. ?Lab Results  ?Component Value Date  ? INR 1.8 (H) 11/16/2021  ? INR 1.5 (H) 11/13/2021  ? INR 1.5 (H) 11/07/2021  ? ?Studies/Results: ?DG Chest 2 View ? ?Result Date: 12/30/2021 ?CLINICAL DATA:  Worsening shortness of breath. EXAM: CHEST - 2 VIEW COMPARISON:  November 17, 2021 FINDINGS: Enlarged cardiomediastinal silhouette, unchanged from prior. Central vascular prominence. Mild bilateral interstitial opacities. No visible pleural effusion or pneumothorax. Elevation of the right hemidiaphragm. Embolization coils project over the upper abdomen on the lateral view. No acute osseous abnormality. IMPRESSION: Enlarged cardiomediastinal silhouette with central vascular prominence and mild bilateral interstitial opacities, likely edema. Electronically Signed   By: Dahlia Bailiff M.D.   On: 12/30/2021 11:30   ? ?Medications: ? sodium chloride    ? sodium chloride    ? ? carvedilol  3.125 mg Oral BID WC  ? Chlorhexidine Gluconate Cloth  6 each Topical Q0600  ? heparin  5,000 Units Subcutaneous Q8H  ?  mouth rinse  15 mL Mouth Rinse BID  ? ? ?Dialysis Orders: ?TTS - [facility] ?3hrs56mn, BFR 450, DFR Auto 1.5, EDW 80kg, 2K/ 2Ca ?Heparin No heparin bolus ordered ?Mircera 225 mcg q2wks - last 12/20/21 ?Hectorol 11m IV qHD-last 12/29/21  ?Venofer '100mg'$  IV weekly ?Sevelamer '800mg'$  2 tabs with meals and 1 tab with snacks ? ?Assessment/Plan: ?Acute Hypoxic Respiratory Failure/Volume Overload- BNP and troponin high. Not reaching EDW in outpatient. Breathing is improved. HD 3/17 with net UF 4L. Plan for short sequential tx today. Push UF. ?ESRD - on HD TTS. Please see above. ?Hypertension/volume  -  Bps high in setting of hypervolemia. Hope to improve with HD. Continue Coreg ?Anemia of CKD - Hgb 9.8, ESA not due yet. Monitor trend. ?Secondary Hyperparathyroidism - Corr Ca 9.6, will check PO4 in am. ?Nutrition - Renal diet with fluid restriction ? ?CoTobie PoetNP ?[facility] ?12/31/2021,8:41 AM ? LOS: 0 days  ?  ?

## 2021-12-31 NOTE — Care Management Obs Status (Signed)
MEDICARE OBSERVATION STATUS NOTIFICATION ? ? ?Patient Details  ?Name: Timothy [name] ?MRN: [id number] ?Date of Birth: 1961/08/06 ? ? ?Medicare Observation Status Notification Given:  Yes ? ? ? ?Bartholomew Crews, RN ?12/31/2021, 5:48 PM ?

## 2021-12-31 NOTE — Progress Notes (Signed)
? ?HD#1 ?SUBJECTIVE:  ?Patient Summary: Timothy [name] is a 61 y.o. 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  ? ?Overnight Events: No acute events overnight. ? ?Interim History: This is hospital day 1 for this patient who was seen and evaluated at the bedside this morning. He states that his breathing is much better after getting dialysis yesterday. His leg swelling is a "a little better" and his leg pain is improved. Tolerating po intake well.  ? ?OBJECTIVE:  ?Vital Signs: ?Vitals:  ? 12/30/21 1731 12/30/21 1814 12/30/21 1816 12/30/21 2100  ?BP: (!) 171/114 (!) 169/104  (!) 152/100  ?Pulse: 87 89  92  ?Resp: '20 18  18  '$ ?Temp: 98.1 ?F (36.7 ?C) 98 ?F (36.7 ?C)  98 ?F (36.7 ?C)  ?TempSrc:    Oral  ?SpO2:  99%  98%  ?Weight:   84.7 kg   ?Height:   '6\' 1"'$  (1.854 m)   ? ?Supplemental O2: Nasal Cannula ?SpO2: 98 % ?O2 Flow Rate (L/min): 4 L/min ? ?Filed Weights  ? 12/30/21 1400 12/30/21 1816  ?Weight: 75.8 kg 84.7 kg  ? ? ? ?Intake/Output Summary (Last 24 hours) at 12/31/2021 0615 ?Last data filed at 12/30/2021 1731 ?Gross per 24 hour  ?Intake --  ?Output 4000 ml  ?Net -4000 ml  ? ?Net IO Since Admission: -4,000 mL [12/31/21 0615] ? ?Physical Exam: ?General: Chronically ill-appearing male laying in bed. No acute distress. ?CV: RRR. No murmurs, rubs, or gallops. 1+ bilateral lower extremity edema  ?Pulmonary: Normal work of breathing on room air. Bilateral crackles in lung bases, although improved. ?Abdominal: Soft, nontender, nondistended. Normal bowel sounds. ?Extremities: Palpable radial and DP pulses. L AVF with bruit.  ?Skin: Warm and dry.  ?Neuro: A&Ox3. Moves all extremities. Normal sensation. No focal deficit. ?Psych: Normal mood and affect ? ? ? ? ?ASSESSMENT/PLAN:  ?Assessment: ?Active Problems: ?  Acute respiratory failure with hypoxia  (HCC) ? ? 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 He presented with shortness of breath in the setting of volume overload which is secondary to incomplete HD sessions as well as his CHF. Patient was taken to HD yesterday from the emergency department and had 4 L of fluid removed. He is now on saturating well on room air and is improving from a respiratory standpoint.  ?- Resume coreg 3.125 mg bid ?- Fluid removal in HD, as noted below ?- Daily BMP ?- Maintain O2 sat >92% ? ?#ESRD on HD T/Th/S ?Patient reports that he has been on dialysis for ~6 years. He has not missed any HD sessions recently, but has not been able to receive complete treatments due to ongoing cramping issues. He received HD yesterday (off scheduled) and had 4 L fluid removed. Patient to go to HD again today.  ?- Nephrology consulted, appreciate recs ?- Avoid nephrotoxins ?- Daily BMP and CBC ? ?#Hypertension ?SBP on arrival in the 180s, likely secondary to volume overload. BP remains elevated at 172/114 this morning although he is asymptomatic (no headache, vision changes, chest pain). At home, patient is on coreg 3.125 mg bid. Will assess BP after HD, and if still elevated, will increase coreg dose to 6.25 mg bid.  ?- Continue coreg 3.125 mg bid for now ? ?#Anemia of chronic  kidney disease ?Hb 9.7 this morning and was ~7.3 two weeks ago. No active signs of bleeding at this time and Hb remains stable.  ?- Daily CBC ?- ESA per nephrology  ? ?Best Practice: ?Diet: Renal diet ?IVF: Fluids: none ?VTE: heparin injection 5,000 Units Start: 12/30/21 1830 ?Code: DNR ?AB: None ?Therapy Recs: Pending ?DISPO: Anticipated discharge in 1-2 days to Home pending Medical stability. ? ?Signature: ?Romney Compean, D.O.  ?Internal Medicine Resident, PGY-1 ?[facility]  ?Pager: [phone] ?6:15 AM, 12/31/2021  ? ?Please contact the on call pager after 5 pm  and on weekends at [phone]. ? ?

## 2022-01-01 DIAGNOSIS — J9601 Acute respiratory failure with hypoxia: Secondary | ICD-10-CM | POA: Diagnosis not present

## 2022-01-01 MED ORDER — CARVEDILOL 6.25 MG PO TABS: 6.2500 mg | ORAL_TABLET | Freq: Two times a day (BID) | ORAL | Status: DC

## 2022-01-01 MED ORDER — CARVEDILOL 3.125 MG PO TABS
3.1250 mg | ORAL_TABLET | Freq: Once | ORAL | Status: AC
Start: 2022-01-01 — End: 2022-01-01
  Administered 2022-01-01: 3.125 mg via ORAL
  Filled 2022-01-01: qty 1

## 2022-01-01 MED ORDER — OXYCODONE HCL 5 MG PO TABS
5.0000 mg | ORAL_TABLET | Freq: Once | ORAL | Status: AC
  Administered 2022-01-01: 5 mg via ORAL
  Filled 2022-01-01: qty 1

## 2022-01-01 MED ORDER — CARVEDILOL 6.25 MG PO TABS
6.2500 mg | ORAL_TABLET | Freq: Two times a day (BID) | ORAL | 2 refills | Status: DC
Start: 2022-01-01 — End: 2022-04-09

## 2022-01-01 NOTE — Progress Notes (Signed)
Pt refused CHG bath, heparin, education provided.  ?

## 2022-01-01 NOTE — Evaluation (Signed)
Occupational Therapy Evaluation ?Patient Details ?Name: Timothy [name] ?MRN: [id number] ?DOB: [birth year] ?Today's Date: 01/01/2022 ? ? ?History of Present Illness 61 y/o male presented to ED on 12/30/21 for SOB. Found to be in acute hypoxic respiratory failure and acute on chronic CHF. PMH: ESRD (HD TTS), colon cancer, CHF, polysubstance abuse  ? ?Clinical Impression ?  ?PT admitted for concerns listed above. PTA Pt reported that he was independent with all ADL's and IADL's. At this time, pt presents with mild weakness and decreased activity tolerance. He does not need any DME at this time and was mod I to supervision for ADL's and functional mobility. Pt has no further OT needs and acute OT will sign off.   ?   ? ?Recommendations for follow up therapy are one component of a multi-disciplinary discharge planning process, led by the attending physician.  Recommendations may be updated based on patient status, additional functional criteria and insurance authorization.  ? ?Follow Up Recommendations ? No OT follow up  ?  ?Assistance Recommended at Discharge PRN  ?Patient can return home with the following   ? ?  ?Functional Status Assessment ? Patient has had a recent decline in their functional status and demonstrates the ability to make significant improvements in function in a reasonable and predictable amount of time.  ?Equipment Recommendations ? None recommended by OT  ?  ?Recommendations for Other Services   ? ? ?  ?Precautions / Restrictions Precautions ?Precautions: Fall ?Restrictions ?Weight Bearing Restrictions: No  ? ?  ? ?Mobility Bed Mobility ?  ?  ?  ?  ?  ?  ?  ?General bed mobility comments: Pt up in recliner ?  ? ?Transfers ?Overall transfer level: Needs assistance ?Equipment used: None ?Transfers: Sit to/from Stand ?Sit to Stand: Supervision ?  ?  ?  ?  ?  ?General transfer comment: supervision for safety ?  ? ?  ?Balance Overall balance assessment: Mild deficits observed, not formally tested ?  ?  ?  ?   ?  ?  ?  ?  ?  ?  ?  ?  ?  ?  ?  ?  ?  ?  ?   ? ?ADL either performed or assessed with clinical judgement  ? ?ADL Overall ADL's : At baseline;Modified independent ?  ?  ?  ?  ?  ?  ?  ?  ?  ?  ?  ?  ?  ?  ?  ?  ?  ?  ?  ?   ? ? ? ?Vision Baseline Vision/History: 1 Wears glasses ?Ability to See in Adequate Light: 0 Adequate ?Patient Visual Report: No change from baseline ?Vision Assessment?: No apparent visual deficits  ?   ?Perception   ?  ?Praxis   ?  ? ?Pertinent Vitals/Pain Pain Assessment ?Pain Assessment: No/denies pain  ? ? ? ?Hand Dominance Right ?  ?Extremity/Trunk Assessment Upper Extremity Assessment ?Upper Extremity Assessment: Generalized weakness ?  ?Lower Extremity Assessment ?Lower Extremity Assessment: Defer to PT evaluation ?  ?Cervical / Trunk Assessment ?Cervical / Trunk Assessment: Kyphotic ?  ?Communication Communication ?Communication: No difficulties ?  ?Cognition Arousal/Alertness: Awake/alert ?Behavior During Therapy: [facility] for tasks assessed/performed ?Overall Cognitive Status: Within Functional Limits for tasks assessed ?  ?  ?  ?  ?  ?  ?  ?  ?  ?  ?  ?  ?  ?  ?  ?  ?  ?  ?  ?General Comments  VSS on RA, pt not interested in therapy ? ?  ?Exercises   ?  ?Shoulder Instructions    ? ? ?Home Living Family/patient expects to be discharged to:: Private residence ?Living Arrangements: Spouse/significant other ?Available Help at Discharge: Family;Available PRN/intermittently ?Type of Home: Apartment ?Home Access: Stairs to enter ?Entrance Stairs-Number of Steps: 2 flights to 3rd story apt ?Entrance Stairs-Rails: Left ?Home Layout: One level ?  ?  ?Bathroom Shower/Tub: Tub/shower unit ?  ?Bathroom Toilet: Standard ?Bathroom Accessibility: Yes ?  ?Home Equipment: Shower seat;Cane - single point ?  ?  ?  ? ?  ?Prior Functioning/Environment Prior Level of Function : Independent/Modified Independent ?  ?  ?  ?  ?  ?  ?  ?  ?  ? ?  ?  ?OT Problem List: Decreased strength;Decreased activity  tolerance;Impaired balance (sitting and/or standing) ?  ?   ?OT Treatment/Interventions:    ?  ?OT Goals(Current goals can be found in the care plan section) Acute Rehab OT Goals ?Patient Stated Goal: To go home ?OT Goal Formulation: With patient ?Time For Goal Achievement: 01/01/22 ?Potential to Achieve Goals: Good  ?OT Frequency:   ?  ? ?Co-evaluation   ?  ?  ?  ?  ? ?  ?AM-PAC OT "6 Clicks" Daily Activity     ?Outcome Measure Help from another person eating meals?: None ?Help from another person taking care of personal grooming?: None ?Help from another person toileting, which includes using toliet, bedpan, or urinal?: None ?Help from another person bathing (including washing, rinsing, drying)?: None ?Help from another person to put on and taking off regular upper body clothing?: None ?Help from another person to put on and taking off regular lower body clothing?: None ?6 Click Score: 24 ?  ?End of Session Nurse Communication: Mobility status ? ?Activity Tolerance: Patient tolerated treatment well ?Patient left: in chair;with call bell/phone within reach ? ?OT Visit Diagnosis: Unsteadiness on feet (R26.81);Other abnormalities of gait and mobility (R26.89);Muscle weakness (generalized) (M62.81)  ?              ?Time: 1011-1020 ?OT Time Calculation (min): 9 min ?Charges:  OT General Charges ?$OT Visit: 1 Visit ?OT Evaluation ?$OT Eval Low Complexity: 1 Low ? ?Timothy Traughber H., Timothy [name] ?Acute Rehabilitation ? ?Timothy [name] ?01/01/2022, 1:15 PM ?

## 2022-01-01 NOTE — Progress Notes (Signed)
?[facility] ?Progress Note  ? ?Subjective:    ?Seen and examined patient at bedside. Sitting up in recliner. Reports his breathing has improved. Noted now on RA. Tolerated 3/17 and 3/18 HD with net UF 4L.  ? ?Objective ?Vitals:  ? 12/31/21 1549 12/31/21 1701 12/31/21 2122 01/01/22 0631  ?BP: (!) 166/112 (!) 177/123 (!) 156/101 (!) 162/111  ?Pulse: 95 (!) 102 97 97  ?Resp: (!) '21 16 16 18  '$ ?Temp: 98 ?F (36.7 ?C) 97.6 ?F (36.4 ?C) 98.8 ?F (37.1 ?C) 97.9 ?F (36.6 ?C)  ?TempSrc:  Oral Oral Oral  ?SpO2: 95% 95% 97% 100%  ?Weight:      ?Height:      ? ?Physical Exam ?General: Chronically ill-appearing, now on RA ?Lungs: Breathing improved, air movement bilaterally; mildly diminished at bases; No wheezing, rales, or rhonchi  ?Heart: RRR. No murmur, rubs or gallops.  ?Abdomen: round, distended, soft, nontender, +BS, no rebound tenderness  ?Lower extremities: 1+ edema (L leg>R leg); Noted mild edema LUE ?Neuro: AAOx3. Moves all extremities spontaneously. ?Dialysis Access: L AVF (+) B/T ? ?Filed Weights  ? 12/30/21 1400 12/30/21 1816 12/31/21 1212  ?Weight: 75.8 kg 84.7 kg 83 kg  ? ? ?Intake/Output Summary (Last 24 hours) at 01/01/2022 0841 ?Last data filed at 01/01/2022 0600 ?Gross per 24 hour  ?Intake 655 ml  ?Output 4000 ml  ?Net -3345 ml  ? ? ?Additional Objective ?Labs: ?Basic Metabolic Panel: ?Recent Labs  ?Lab 12/30/21 ?1110 12/31/21 ?1000  ?NA 135 134*  ?K 4.0 3.9  ?CL 94* 96*  ?CO2 28 26  ?GLUCOSE 75 122*  ?BUN 44* 32*  ?CREATININE 6.26* 5.12*  ?CALCIUM 8.6* 8.7*  ? ?Liver Function Tests: ?Recent Labs  ?Lab 12/30/21 ?1110  ?AST 24  ?ALT 14  ?ALKPHOS 93  ?BILITOT 1.1  ?PROT 7.6  ?ALBUMIN 2.7*  ? ?No results for input(s): LIPASE, AMYLASE in the last 168 hours. ?CBC: ?Recent Labs  ?Lab 12/30/21 ?1110 12/31/21 ?1000  ?WBC 6.8 6.7  ?NEUTROABS 5.0  --   ?HGB 9.8* 9.7*  ?HCT 32.1* 31.2*  ?MCV 89.7 89.1  ?PLT 238 233  ? ?Blood Culture ?   ?Component Value Date/Time  ? SDES PERITONEAL FLUID 06/22/2021 1236  ?  [place] ABDOMEN 06/22/2021 1236  ? CULT  06/22/2021 1236  ?  NO GROWTH 3 DAYS ?Performed at [facility], [place] [address] [place], [place] [address] ?  ? REPTSTATUS 06/26/2021 FINAL 06/22/2021 1236  ? ? ?Cardiac Enzymes: ?No results for input(s): CKTOTAL, CKMB, CKMBINDEX, TROPONINI in the last 168 hours. ?CBG: ?No results for input(s): GLUCAP in the last 168 hours. ?Iron Studies: No results for input(s): IRON, TIBC, TRANSFERRIN, FERRITIN in the last 72 hours. ?Lab Results  ?Component Value Date  ? INR 1.8 (H) 11/16/2021  ? INR 1.5 (H) 11/13/2021  ? INR 1.5 (H) 11/07/2021  ? ?Studies/Results: ?DG Chest 2 View ? ?Result Date: 12/30/2021 ?CLINICAL DATA:  Worsening shortness of breath. EXAM: CHEST - 2 VIEW COMPARISON:  November 17, 2021 FINDINGS: Enlarged cardiomediastinal silhouette, unchanged from prior. Central vascular prominence. Mild bilateral interstitial opacities. No visible pleural effusion or pneumothorax. Elevation of the right hemidiaphragm. Embolization coils project over the upper abdomen on the lateral view. No acute osseous abnormality. IMPRESSION: Enlarged cardiomediastinal silhouette with central vascular prominence and mild bilateral interstitial opacities, likely edema. Electronically Signed   By: Dahlia Bailiff M.D.   On: 12/30/2021 11:30   ? ?Medications: ? ? carvedilol  3.125 mg Oral Once  ?  carvedilol  6.25 mg Oral BID WC  ? Chlorhexidine Gluconate Cloth  6 each Topical Q0600  ? gabapentin  100 mg Oral QHS  ? heparin  5,000 Units Subcutaneous Q8H  ? mouth rinse  15 mL Mouth Rinse BID  ? ? ?Dialysis Orders: ?TTS - [facility] ?3hrs12mn, BFR 450, DFR Auto 1.5, EDW 80kg, 2K/ 2Ca ?Heparin No heparin bolus ordered ?Mircera 225 mcg q2wks - last 12/20/21 ?Hectorol 162m IV qHD-last 12/29/21  ?Venofer '100mg'$  IV weekly ?Sevelamer '800mg'$  2 tabs with meals and 1 tab with snacks ? ?Assessment/Plan: ?Acute Hypoxic Respiratory Failure/Volume Overload- BNP and troponin high. Not reaching  EDW in outpatient. Breathing is improved. Now on RA. HD 3/17 with net UF 4L. Tolerated HD 3/18 with net UF 4L.  ?ESRD - on HD TTS. Please see above. Noted mild swelling LUE. L AVF is working well, may need duplex but can be performed outpatient. ?Hypertension/volume  - Bps high in setting of hypervolemia. Hope to improve with HD. Continue home anti-hypertensives ?Anemia of CKD - Hgb 9.7, ESA not due yet. Monitor trend. ?Secondary Hyperparathyroidism - Corr Ca 9.6, will check PO4 in am. ?Nutrition - Renal diet with fluid restriction ?Dispo-Okay for discharge from renal standpoint. Patient can resume HD in outpatient. ? ?CoTobie PoetNP ?[facility] ?01/01/2022,8:41 AM ? LOS: 0 days  ?  ?

## 2022-01-01 NOTE — Discharge Summary (Signed)
? ?Name: Timothy [name] ?MRN: [id number] ?DOB: 02/06/1961 61 y.o. ?PCP: [name], MD ? ?Date of Admission: 12/30/2021 10:58 AM ?Date of Discharge: 01/01/2022 ?Attending Physician: Dr. Cain Sieve ? ?Discharge Diagnosis: ?Principal Problem: ?  Acute respiratory failure with hypoxia (HCC) ?Active Problems: ?  Hypervolemia ?  ? ?Discharge Medications: ?Allergies as of 01/01/2022   ?No Known Allergies ?  ? ?  ?Medication List  ?  ? ?STOP taking these medications   ? ?polyethylene glycol powder 17 GM/SCOOP powder ?Commonly known as: MiraLax ?  ?sucralfate 1 g tablet ?Commonly known as: Carafate ?  ? ?  ? ?TAKE these medications   ? ?acetaminophen 325 MG tablet ?Commonly known as: TYLENOL ?Take 2 tablets (650 mg total) by mouth every 6 (six) hours as needed for mild pain or moderate pain. ?  ?bisacodyl 10 MG suppository ?Commonly known as: DULCOLAX ?Place 1 suppository (10 mg total) rectally daily as needed for moderate constipation. ?  ?carvedilol 6.25 MG tablet ?Commonly known as: COREG ?Take 1 tablet (6.25 mg total) by mouth 2 (two) times daily with a meal. ?What changed:  ?medication strength ?how much to take ?  ?diltiazem 360 MG 24 hr capsule ?Commonly known as: TIAZAC ?Take 360 mg by mouth daily. ?  ?gabapentin 100 MG capsule ?Commonly known as: Neurontin ?Take 1 capsule (100 mg total) by mouth daily. ?  ?LIDOCAINE-PRILOCAINE EX ?Apply 1 application. topically Every Tuesday,Thursday,and Saturday with dialysis. ?  ?multivitamin Tabs tablet ?Take 1 tablet by mouth at bedtime. ?  ?oxyCODONE 5 MG immediate release tablet ?Commonly known as: Oxy IR/ROXICODONE ?Take 5 mg by mouth every 8 (eight) hours as needed for pain. ?  ?pantoprazole 40 MG tablet ?Commonly known as: PROTONIX ?Take 1 tablet (40 mg total) by mouth 2 (two) times daily. ?  ?sevelamer carbonate 800 MG tablet ?Commonly known as: RENVELA ?Take 800 mg by mouth 2 (two) times daily with a meal. ?  ? ?  ? ? ?Disposition and follow-up:   ?Mr.Jauan Wohl  was discharged from [facility] in Stable condition.  At the hospital follow up visit please address: ? ?1.  Follow-up: ? a. ESRD/Hypervolemia: Patient had 2 HD sessions with 8 L of ultrafiltration off. Assess fluid status, shortness of breath and ensure adherence to outpatient HD sessions.  ?  ? b. Hypertension: BP remained elevated during hospitalization. Ensure adherence to increased dose of coreg from 3.125 mg BID to 6.25 mg BID ? ?2.  Labs / imaging needed at time of follow-up: Renal function panel  ? ?3.  Pending labs/ test needing follow-up: None ? ?Follow-up Appointments: ? Follow-up Information   ? ? [name], MD. Schedule an appointment as soon as possible for a visit in 1 week(s).   ?Specialty: Internal Medicine ?Why: Call and make a 1 week hospital follow up ?Contact information: ?[address] ?[place] [address] ?[phone] ? ? ?  ?  ? ?  ?  ? ?  ? ? ?Hospital Course by problem list: ? ?#Acute hypoxic respiratory failure  ?#Acute on chronic HFmrEF ?#ESRD on HD T/Th/S ?Patient presented on 3/17 with shortness of breath x 1 day and bilateral lower extremity edema. This was secondary to volume overload that was thought to be due to incomplete HD sessions because of cramping during outpatient HD. Labs was significant for BNP > 4500 with troponin of 132 that down-trending. Nephrology was consulted and patient was taken to HD from the emergency department and had 4 L of fluid  removed. Patient improved from 4L [place] to room air. He received another HD session the next day with another 4 L out. He remained hemodynamically stable throughout his hospitalization. He was discharged home on 3/19 to continue outpatient HD on Tuesday.  ? ?#Hypertension ?SBP on arrival was 180s, likely secondary to volume overload. BP remains elevated throughout his short stay with systolic blood pressure ranging from 140-180. His home coreg was resumed on admission and increased to 6.25 mg twice daily  before discharge.  ?  ?Subjective: ?Patient found standing next to door of room. Patient was asked to go back to the room to be evaluated. Patient reported he is ready to go and had been cleared by nephrologist. Patent denied any SOB, CP, pain or N/V. Leg swelling improved.  ? ?Objective: ? ?Discharge Vitals:   ?BP (!) 161/110   Pulse 100   Temp 98 ?F (36.7 ?C) (Oral)   Resp 16   Ht '6\' 1"'$  (1.854 m)   Wt 83 kg   SpO2 97%   BMI 24.14 kg/m?  ?General: Pleasant, well-appearing male sitting in bed. NAD ?CV: RRR. No murmurs, rubs, or gallops. Trace BLE edema ?Pulmonary: Lungs CTAB. Normal effort. No wheezing or rales. ?Abdominal: Soft, nontender, nondistended. Normal bowel sounds. ?Extremities: L AVF with palpable thrill.  ?Skin: Warm and dry. No obvious rash or lesions. ?Neuro: A&Ox3. Moves all extremities. Normal sensation. No focal deficit. ?Psych: Normal mood and affect ? ? ?Pertinent Labs, Studies, and Procedures:  ?CBC Latest Ref Rng & Units 12/31/2021 12/30/2021 12/02/2021  ?WBC 4.0 - 10.5 K/uL 6.7 6.8 -  ?Hemoglobin 13.0 - 17.0 g/dL 9.7(L) 9.8(L) 7.3(L)  ?Hematocrit 39.0 - 52.0 % 31.2(L) 32.1(L) 23.8(L)  ?Platelets 150 - 400 K/uL 233 238 -  ? ? ?CMP Latest Ref Rng & Units 12/31/2021 12/30/2021 12/01/2021  ?Glucose 70 - 99 mg/dL 122(H) 75 83  ?BUN 6 - 20 mg/dL 32(H) 44(H) 51(H)  ?Creatinine 0.61 - 1.24 mg/dL 5.12(H) 6.26(H) 6.99(H)  ?Sodium 135 - 145 mmol/L 134(L) 135 130(L)  ?Potassium 3.5 - 5.1 mmol/L 3.9 4.0 3.6  ?Chloride 98 - 111 mmol/L 96(L) 94(L) 96(L)  ?CO2 22 - 32 mmol/L '26 28 24  '$ ?Calcium 8.9 - 10.3 mg/dL 8.7(L) 8.6(L) 7.8(L)  ?Total Protein 6.5 - 8.1 g/dL - 7.6 -  ?Total Bilirubin 0.3 - 1.2 mg/dL - 1.1 -  ?Alkaline Phos 38 - 126 U/L - 93 -  ?AST 15 - 41 U/L - 24 -  ?ALT 0 - 44 U/L - 14 -  ? ? ?DG Chest 2 View ? ?Result Date: 12/30/2021 ?CLINICAL DATA:  Worsening shortness of breath. EXAM: CHEST - 2 VIEW COMPARISON:  November 17, 2021 FINDINGS: Enlarged cardiomediastinal silhouette, unchanged from prior.  Central vascular prominence. Mild bilateral interstitial opacities. No visible pleural effusion or pneumothorax. Elevation of the right hemidiaphragm. Embolization coils project over the upper abdomen on the lateral view. No acute osseous abnormality. IMPRESSION: Enlarged cardiomediastinal silhouette with central vascular prominence and mild bilateral interstitial opacities, likely edema. Electronically Signed   By: Dahlia Bailiff M.D.   On: 12/30/2021 11:30  ?  ? ?Discharge Instructions: ?Discharge Instructions   ? ? Diet - low sodium heart healthy   Complete by: As directed ?  ? Increase activity slowly   Complete by: As directed ?  ? ?  ? ? ?Signed: ?Lacinda Axon, MD ?01/01/2022, 12:44 PM   ?Pager: [phone] ?  ?

## 2022-01-01 NOTE — Progress Notes (Signed)
DISCHARGE NOTE HOME ?Timothy [name] to be discharged Home per MD order. Discussed prescriptions and follow up appointments with the patient. Prescriptions given to patient; medication list explained in detail. Patient verbalized understanding. ? ?Skin clean, dry and intact without evidence of skin break down, no evidence of skin tears noted. IV catheter discontinued intact. Site without signs and symptoms of complications. Dressing and pressure applied. Pt denies pain at the site currently. No complaints noted. ? ?Patient free of lines, drains, and wounds.  ? ?An After Visit Summary (AVS) was printed and given to the patient. ?Patient escorted via wheelchair, and discharged home via private auto. ? ?Vira Agar, RN  ?

## 2022-01-01 NOTE — Discharge Instructions (Addendum)
Timothy [name],  ?It was a pleasure taking care of you at [facility] were admitted for difficulty breathing and treated for fluid in your lungs. We took off 8 L of fluids with 2 sessions of dialysis. We are discharging you home now that you are doing better. Please follow the following instructions.  ?1) Resume your dialysis session on Tuesday ?2) increase your coreg to 6.25 mg twice daily ? ?Take care,  ?Dr. Linwood Dibbles, MD, MPH ? ?

## 2022-01-01 NOTE — Plan of Care (Signed)
?  Problem: Health Behavior/Discharge Planning: ?Goal: Ability to manage health-related needs will improve ?Outcome: Completed/Met ?  ?Problem: Clinical Measurements: ?Goal: Ability to maintain clinical measurements within normal limits will improve ?Outcome: Completed/Met ?Goal: Will remain free from infection ?Outcome: Completed/Met ?Goal: Diagnostic test results will improve ?Outcome: Completed/Met ?Goal: Respiratory complications will improve ?Outcome: Completed/Met ?Goal: Cardiovascular complication will be avoided ?Outcome: Completed/Met ?  ?Problem: Activity: ?Goal: Risk for activity intolerance will decrease ?Outcome: Completed/Met ?  ?Problem: Nutrition: ?Goal: Adequate nutrition will be maintained ?Outcome: Completed/Met ?  ?Problem: Elimination: ?Goal: Will not experience complications related to bowel motility ?Outcome: Completed/Met ?  ?Problem: Safety: ?Goal: Ability to remain free from injury will improve ?Outcome: Completed/Met ?  ?

## 2022-01-02 ENCOUNTER — Telehealth: Payer: Self-pay | Admitting: Nurse Practitioner

## 2022-01-02 LAB — HEPATITIS B SURFACE ANTIBODY, QUANTITATIVE: Hep B S AB Quant (Post): 48.6 m[IU]/mL (ref >9.9)

## 2022-01-02 NOTE — Telephone Encounter (Signed)
Transition of care contact from inpatient facility ? ?Date of Discharge: 01/01/2022 ?Date of Contact: 01/02/2022 ?Method of contact: Phone ? ?Attempted to contact patient to discuss transition of care from inpatient admission. Patient did not answer the phone. Message was left on the patient's voicemail with call back number [phone]. ? ?

## 2022-01-26 NOTE — Progress Notes (Incomplete)
?Office Note  ? ? ? ?CC: Hand swelling the site of AV fistula ?Requesting Provider:  [name], MD ? ?HPI: Timothy [name] is a 61 y.o. (Apr 07, 1961) male presenting at the request of .[name], MD for left hand swelling with known left-sided brachiocephalic fistula.  This fistula was originally created by Dr. Geryl Councilman on 07/05/2017.  It has been functioning well. ? ?The pt is *** on a statin for cholesterol management.  ?The pt is *** on a daily aspirin.   Other AC:  *** ?The pt is *** on medication for hypertension.   ?The pt is *** diabetic.  ?Tobacco hx:  *** ? ?Past Medical History:  ?Diagnosis Date  ? Anemia of chronic kidney failure   ? BPH (benign prostatic hyperplasia)   ? Colon cancer [facility]) 2014  ? spouse states he had surgury for colon CA  ? End stage renal disease on dialysis [facility] [place]) 2017  ? Hypertension   ? Hypertensive heart disease with chronic diastolic congestive heart failure ([place]) 07/17/2016  ? Polysubstance abuse ([place])   ? History of heroin and marijuana use  ? ? ?Past Surgical History:  ?Procedure Laterality Date  ? A/V FISTULAGRAM Left 12/02/2021  ? Procedure: A/V Fistulagram;  Surgeon: Cherre Lily Kernen, MD;  Location: [facility];  Service: Cardiovascular;  Laterality: Left;  ? ABDOMINAL SURGERY    ? AV FISTULA PLACEMENT Left 09/19/2016  ? Procedure: Left arm Radiocephalic ARTERIOVENOUS (AV) FISTULA CREATION;  Surgeon: Conrad Ames, MD;  Location: [facility];  Service: Vascular;  Laterality: Left;  ? BASCILIC VEIN TRANSPOSITION Left 07/09/2017  ? Procedure: BRACHIOCEPHALIC FISTULA CREATION;  Surgeon: Conrad Kokomo, MD;  Location: [facility];  Service: Vascular;  Laterality: Left;  ? COLON SURGERY  2014  ? ESOPHAGOGASTRODUODENOSCOPY (EGD) WITH PROPOFOL N/A 11/05/2021  ? Procedure: ESOPHAGOGASTRODUODENOSCOPY (EGD) WITH PROPOFOL;  Surgeon: Carol Ada, MD;  Location: [facility];  Service: Endoscopy;  Laterality: N/A;  ? ESOPHAGOGASTRODUODENOSCOPY (EGD) WITH PROPOFOL N/A 11/14/2021  ?  Procedure: ESOPHAGOGASTRODUODENOSCOPY (EGD) WITH PROPOFOL;  Surgeon: [name], MD;  Location: [facility];  Service: Gastroenterology;  Laterality: N/A;  ? ESOPHAGOGASTRODUODENOSCOPY (EGD) WITH PROPOFOL N/A 11/16/2021  ? Procedure: ESOPHAGOGASTRODUODENOSCOPY (EGD) WITH PROPOFOL;  Surgeon: [name], MD;  Location: [facility];  Service: Gastroenterology;  Laterality: N/A;  ? HEMOSTASIS CLIP PLACEMENT  11/16/2021  ? Procedure: HEMOSTASIS CLIP PLACEMENT;  Surgeon: [name], MD;  Location: [facility];  Service: Gastroenterology;;  ? HEMOSTASIS CONTROL  11/05/2021  ? Procedure: HEMOSTASIS CONTROL;  Surgeon: Carol Ada, MD;  Location: [facility];  Service: Endoscopy;;  ? HEMOSTASIS CONTROL  11/16/2021  ? Procedure: HEMOSTASIS CONTROL;  Surgeon: [name], MD;  Location: [facility] [place];  Service: Gastroenterology;;  ? HOT HEMOSTASIS N/A 11/16/2021  ? Procedure: HOT HEMOSTASIS (ARGON PLASMA COAGULATION/BICAP);  Surgeon: [name], MD;  Location: [facility];  Service: Gastroenterology;  Laterality: N/A;  ? INSERTION OF DIALYSIS CATHETER N/A 09/19/2016  ? Procedure: INSERTION OF TUNNELED DIALYSIS CATHETER;  Surgeon: Conrad Bishopville, MD;  Location: [place];  Service: Vascular;  Laterality: N/A;  ? IR [place]  11/16/2021  ? IR ANGIOGRAM VISCERAL SELECTIVE  11/05/2021  ? IR ANGIOGRAM VISCERAL SELECTIVE  11/16/2021  ? IR ANGIOGRAM VISCERAL SELECTIVE  11/16/2021  ? IR EMBO ART  VEN HEMORR LYMPH EXTRAV  INC GUIDE ROADMAPPING  11/05/2021  ? IR EMBO ART  VEN HEMORR LYMPH EXTRAV  INC GUIDE ROADMAPPING  11/16/2021  ? IR GENERIC  HISTORICAL  09/14/2016  ? IR US GUIDE VASC ACCESS RIGHT 09/14/2016 [name], DO [facility]  ? IR GENERIC HISTORICAL  09/14/2016  ? IR FLUORO GUIDE CV LINE RIGHT 09/14/2016 [name], DO [facility]  ? IR PARACENTESIS  06/22/2021  ? IR US GUIDE VASC ACCESS RIGHT  11/05/2021  ? IR US GUIDE VASC ACCESS RIGHT  11/16/2021  ? LAPAROTOMY N/A 05/23/2021  ? Procedure:  EXPLORATORY LAPAROTOMY LYSIS ADHESIONS;  Surgeon: [name], MD;  Location: [place] [facility];  Service: General;  Laterality: N/A;  ? LAPAROTOMY N/A 11/20/2021  ? Procedure: EXPLORATORY LAPAROTOMY, REPAIR OF BLEEDING DUODENAL ULCER;  Surgeon: Dwan Bolt, MD;  Location: [place];  Service: General;  Laterality: N/A;  ? ORIF TIBIA PLATEAU Left 01/15/2018  ? Procedure: OPEN REDUCTION INTERNAL FIXATION (ORIF) TIBIAL PLATEAU;  Surgeon: Altamese [place], MD;  Location: [facility];  Service: Orthopedics;  Laterality: Left;  ? SCLEROTHERAPY  11/05/2021  ? Procedure: SCLEROTHERAPY;  Surgeon: Carol Ada, MD;  Location: [facility];  Service: Endoscopy;;  ? SCLEROTHERAPY  11/16/2021  ? Procedure: SCLEROTHERAPY;  Surgeon: [name], MD;  Location[facility] ENDOSCOPY;  Service: Gastroenterology;;  ? TRANSTHORACIC ECHOCARDIOGRAM  07/2016  ?  EF 60-65%, No RWMA. Mod Concentric LVH - Gr 2 DD. Severe LA dilation. PAP ~35 mmHg (mild Pulm HTN)  --> no changes noted 1 month later  ? ? ?Social History  ? ?Socioeconomic History  ? Marital status: Married  ?  Spouse name: Not on file  ? Number of children: Not on file  ? Years of education: Not on file  ? Highest education level: Not on file  ?Occupational History  ? Occupation: disabled  ?Tobacco Use  ? Smoking status: Some Days  ?  Packs/day: 0.25  ?  Types: Cigarettes  ? Smokeless tobacco: Never  ?Vaping Use  ? Vaping Use: Never used  ?Substance and Sexual Activity  ? Alcohol use: No  ?  Alcohol/week: 7.0 standard drinks  ?  Types: 7 Cans of beer per week  ? Drug use: Yes  ?  Frequency: 1.0 times per week  ?  Types: Marijuana, Heroin  ?  Comment: reports quitting heroin 15 years ago; can't afford daily marijuana  ? Sexual activity: Yes  ?  Comment: once a week  ?Other Topics Concern  ? Not on file  ?Social History Narrative  ? Not on file  ? ?Social Determinants of Health  ? ?Financial Resource Strain: Not on file  ?Food Insecurity: Not on file  ?Transportation Needs: Not on file  ?Physical  Activity: Not on file  ?Stress: Not on file  ?Social Connections: Not on file  ?Intimate Partner Violence: Not on file  ? ?*** ?Family History  ?Problem Relation Age of Onset  ? Heart failure Mother   ?     Died at age 33.  ? Heart attack Mother 16  ? Hypertension Mother   ? Diabetes Mellitus II Mother   ? Other Father   ?     Unknown  ? Kidney failure Sister   ?     (Oldest Sister)  ? Other Other   ?     Multiple siblings have started her heart disease, he is not sure of the details.  ? CAD Nephew   ? ? ?Current Outpatient Medications  ?Medication Sig Dispense Refill  ? acetaminophen (TYLENOL) 325 MG tablet Take 2 tablets (650 mg total) by mouth every 6 (six) hours as needed for mild pain or moderate  pain. (Patient not taking: Reported on 01/01/2022)    ? bisacodyl (DULCOLAX) 10 MG suppository Place 1 suppository (10 mg total) rectally daily as needed for moderate constipation. (Patient not taking: Reported on 01/01/2022) 12 suppository 0  ? carvedilol (COREG) 6.25 MG tablet Take 1 tablet (6.25 mg total) by mouth 2 (two) times daily with a meal. 60 tablet 2  ? diltiazem (TIAZAC) 360 MG 24 hr capsule Take 360 mg by mouth daily.    ? gabapentin (NEURONTIN) 100 MG capsule Take 1 capsule (100 mg total) by mouth daily. (Patient not taking: Reported on 01/01/2022) 30 capsule 1  ? LIDOCAINE-PRILOCAINE EX Apply 1 application. topically Every Tuesday,Thursday,and Saturday with dialysis.    ? multivitamin (RENA-VIT) TABS tablet Take 1 tablet by mouth at bedtime. 30 tablet 1  ? oxyCODONE (OXY IR/ROXICODONE) 5 MG immediate release tablet Take 5 mg by mouth every 8 (eight) hours as needed for pain.    ? pantoprazole (PROTONIX) 40 MG tablet Take 1 tablet (40 mg total) by mouth 2 (two) times daily. 180 tablet 0  ? sevelamer carbonate (RENVELA) 800 MG tablet Take 800 mg by mouth 2 (two) times daily with a meal.    ? ?No current facility-administered medications for this visit.  ? ? ?No Known Allergies ? ? ?REVIEW OF SYSTEMS:   ?*** ?'[X]'$  denotes positive finding, '[ ]'$  denotes negative finding ?Cardiac  Comments:  ?Chest pain or chest pressure:    ?Shortness of breath upon exertion:    ?Short of breath when lying flat:    ?Irregular heart rhyth

## 2022-01-27 ENCOUNTER — Ambulatory Visit (INDEPENDENT_AMBULATORY_CARE_PROVIDER_SITE_OTHER): Payer: Medicare Other | Admitting: Vascular Surgery

## 2022-01-27 DIAGNOSIS — I739 Peripheral vascular disease, unspecified: Secondary | ICD-10-CM

## 2022-02-13 ENCOUNTER — Encounter (HOSPITAL_COMMUNITY): Payer: Self-pay

## 2022-02-13 ENCOUNTER — Other Ambulatory Visit: Payer: Self-pay

## 2022-02-13 ENCOUNTER — Emergency Department (HOSPITAL_COMMUNITY): Payer: Medicare Other

## 2022-02-13 ENCOUNTER — Inpatient Hospital Stay (HOSPITAL_COMMUNITY)
Admission: EM | Admit: 2022-02-13 | Discharge: 2022-02-15 | DRG: 291 | Disposition: A | Discharge status: Home / Self Care | Payer: Medicare Other | Attending: Internal Medicine | Admitting: Internal Medicine

## 2022-02-13 DIAGNOSIS — N186 End stage renal disease: Secondary | ICD-10-CM | POA: Diagnosis present

## 2022-02-13 DIAGNOSIS — Z8249 Family history of ischemic heart disease and other diseases of the circulatory system: Secondary | ICD-10-CM

## 2022-02-13 DIAGNOSIS — I10 Essential (primary) hypertension: Secondary | ICD-10-CM | POA: Diagnosis present

## 2022-02-13 DIAGNOSIS — F17210 Nicotine dependence, cigarettes, uncomplicated: Secondary | ICD-10-CM | POA: Diagnosis present

## 2022-02-13 DIAGNOSIS — I509 Heart failure, unspecified: Secondary | ICD-10-CM | POA: Diagnosis not present

## 2022-02-13 DIAGNOSIS — I132 Hypertensive heart and chronic kidney disease with heart failure and with stage 5 chronic kidney disease, or end stage renal disease: Principal | ICD-10-CM | POA: Diagnosis present

## 2022-02-13 DIAGNOSIS — J9601 Acute respiratory failure with hypoxia: Secondary | ICD-10-CM | POA: Diagnosis present

## 2022-02-13 DIAGNOSIS — N400 Enlarged prostate without lower urinary tract symptoms: Secondary | ICD-10-CM | POA: Diagnosis present

## 2022-02-13 DIAGNOSIS — J9600 Acute respiratory failure, unspecified whether with hypoxia or hypercapnia: Secondary | ICD-10-CM | POA: Diagnosis not present

## 2022-02-13 DIAGNOSIS — E785 Hyperlipidemia, unspecified: Secondary | ICD-10-CM | POA: Diagnosis present

## 2022-02-13 DIAGNOSIS — I5043 Acute on chronic combined systolic (congestive) and diastolic (congestive) heart failure: Secondary | ICD-10-CM | POA: Diagnosis present

## 2022-02-13 DIAGNOSIS — K279 Peptic ulcer, site unspecified, unspecified as acute or chronic, without hemorrhage or perforation: Secondary | ICD-10-CM | POA: Diagnosis present

## 2022-02-13 DIAGNOSIS — Z841 Family history of disorders of kidney and ureter: Secondary | ICD-10-CM

## 2022-02-13 DIAGNOSIS — E8770 Fluid overload, unspecified: Secondary | ICD-10-CM | POA: Diagnosis not present

## 2022-02-13 DIAGNOSIS — Z992 Dependence on renal dialysis: Secondary | ICD-10-CM

## 2022-02-13 DIAGNOSIS — F1910 Other psychoactive substance abuse, uncomplicated: Secondary | ICD-10-CM | POA: Diagnosis present

## 2022-02-13 DIAGNOSIS — Z79899 Other long term (current) drug therapy: Secondary | ICD-10-CM

## 2022-02-13 DIAGNOSIS — Z66 Do not resuscitate: Secondary | ICD-10-CM | POA: Diagnosis present

## 2022-02-13 DIAGNOSIS — D631 Anemia in chronic kidney disease: Secondary | ICD-10-CM | POA: Diagnosis present

## 2022-02-13 DIAGNOSIS — R778 Other specified abnormalities of plasma proteins: Secondary | ICD-10-CM | POA: Diagnosis present

## 2022-02-13 DIAGNOSIS — Z85038 Personal history of other malignant neoplasm of large intestine: Secondary | ICD-10-CM

## 2022-02-13 DIAGNOSIS — N189 Chronic kidney disease, unspecified: Secondary | ICD-10-CM | POA: Diagnosis present

## 2022-02-13 DIAGNOSIS — Z9049 Acquired absence of other specified parts of digestive tract: Secondary | ICD-10-CM

## 2022-02-13 DIAGNOSIS — I480 Paroxysmal atrial fibrillation: Secondary | ICD-10-CM | POA: Diagnosis present

## 2022-02-13 DIAGNOSIS — M898X9 Other specified disorders of bone, unspecified site: Secondary | ICD-10-CM | POA: Diagnosis present

## 2022-02-13 DIAGNOSIS — Z91119 Patient's noncompliance with dietary regimen due to unspecified reason: Secondary | ICD-10-CM

## 2022-02-13 LAB — CBC WITH DIFFERENTIAL/PLATELET
Abs Immature Granulocytes: 0.03 10*3/uL (ref 0.00–0.07)
Basophils Absolute: 0 10*3/uL (ref 0.0–0.1)
Basophils Relative: 1 %
Eosinophils Absolute: 0.1 10*3/uL (ref 0.0–0.5)
Eosinophils Relative: 1 %
HCT: 32.3 % — ABNORMAL LOW (ref 39.0–52.0)
Hemoglobin: 10 g/dL — ABNORMAL LOW (ref 13.0–17.0)
Immature Granulocytes: 1 %
Lymphocytes Relative: 10 %
Lymphs Abs: 0.7 10*3/uL (ref 0.7–4.0)
MCH: 25.6 pg — ABNORMAL LOW (ref 26.0–34.0)
MCHC: 31 g/dL (ref 30.0–36.0)
MCV: 82.6 fL (ref 80.0–100.0)
Monocytes Absolute: 1 10*3/uL (ref 0.1–1.0)
Monocytes Relative: 15 %
Neutro Abs: 4.8 10*3/uL (ref 1.7–7.7)
Neutrophils Relative %: 72 %
Platelets: 194 10*3/uL (ref 150–400)
RBC: 3.91 MIL/uL — ABNORMAL LOW (ref 4.22–5.81)
RDW: 18.6 % — ABNORMAL HIGH (ref 11.5–15.5)
WBC: 6.5 10*3/uL (ref 4.0–10.5)
nRBC: 0 % (ref 0.0–0.2)

## 2022-02-13 LAB — I-STAT CHEM 8, ED
BUN: 70 mg/dL — ABNORMAL HIGH (ref 8–23)
Calcium, Ion: 0.97 mmol/L — ABNORMAL LOW (ref 1.15–1.40)
Chloride: 101 mmol/L (ref 98–111)
Creatinine, Ser: 8 mg/dL — ABNORMAL HIGH (ref 0.61–1.24)
Glucose, Bld: 88 mg/dL (ref 70–99)
HCT: 32 % — ABNORMAL LOW (ref 39.0–52.0)
Hemoglobin: 10.9 g/dL — ABNORMAL LOW (ref 13.0–17.0)
Potassium: 4.4 mmol/L (ref 3.5–5.1)
Sodium: 138 mmol/L (ref 135–145)
TCO2: 30 mmol/L (ref 22–32)

## 2022-02-13 LAB — CBC
HCT: 31.2 % — ABNORMAL LOW (ref 39.0–52.0)
Hemoglobin: 9.5 g/dL — ABNORMAL LOW (ref 13.0–17.0)
MCH: 25.3 pg — ABNORMAL LOW (ref 26.0–34.0)
MCHC: 30.4 g/dL (ref 30.0–36.0)
MCV: 83.2 fL (ref 80.0–100.0)
Platelets: 206 10*3/uL (ref 150–400)
RBC: 3.75 MIL/uL — ABNORMAL LOW (ref 4.22–5.81)
RDW: 18.3 % — ABNORMAL HIGH (ref 11.5–15.5)
WBC: 5.9 10*3/uL (ref 4.0–10.5)
nRBC: 0 % (ref 0.0–0.2)

## 2022-02-13 LAB — I-STAT VENOUS BLOOD GAS, ED
Acid-Base Excess: 7 mmol/L — ABNORMAL HIGH (ref 0.0–2.0)
Bicarbonate: 31.2 mmol/L — ABNORMAL HIGH (ref 20.0–28.0)
Calcium, Ion: 0.98 mmol/L — ABNORMAL LOW (ref 1.15–1.40)
HCT: 33 % — ABNORMAL LOW (ref 39.0–52.0)
Hemoglobin: 11.2 g/dL — ABNORMAL LOW (ref 13.0–17.0)
O2 Saturation: 94 %
Potassium: 4.4 mmol/L (ref 3.5–5.1)
Sodium: 139 mmol/L (ref 135–145)
TCO2: 32 mmol/L (ref 22–32)
pCO2, Ven: 41.9 mmHg — ABNORMAL LOW (ref 44–60)
pH, Ven: 7.48 — ABNORMAL HIGH (ref 7.25–7.43)
pO2, Ven: 67 mmHg — ABNORMAL HIGH (ref 32–45)

## 2022-02-13 LAB — COMPREHENSIVE METABOLIC PANEL
ALT: 16 U/L (ref 0–44)
AST: 28 U/L (ref 15–41)
Albumin: 2.9 g/dL — ABNORMAL LOW (ref 3.5–5.0)
Alkaline Phosphatase: 109 U/L (ref 38–126)
Anion gap: 15 (ref 5–15)
BUN: 62 mg/dL — ABNORMAL HIGH (ref 8–23)
CO2: 26 mmol/L (ref 22–32)
Calcium: 8.7 mg/dL — ABNORMAL LOW (ref 8.9–10.3)
Chloride: 98 mmol/L (ref 98–111)
Creatinine, Ser: 7.42 mg/dL — ABNORMAL HIGH (ref 0.61–1.24)
GFR, Estimated: 8 mL/min — ABNORMAL LOW (ref >60)
Glucose, Bld: 90 mg/dL (ref 70–99)
Potassium: 4.5 mmol/L (ref 3.5–5.1)
Sodium: 139 mmol/L (ref 135–145)
Total Bilirubin: 1.4 mg/dL — ABNORMAL HIGH (ref 0.3–1.2)
Total Protein: 8 g/dL (ref 6.5–8.1)

## 2022-02-13 LAB — TROPONIN I (HIGH SENSITIVITY): Troponin I (High Sensitivity): 111 ng/L (ref <18)

## 2022-02-13 LAB — PROTIME-INR
INR: 1.4 — ABNORMAL HIGH (ref 0.8–1.2)
Prothrombin Time: 17 seconds — ABNORMAL HIGH (ref 11.4–15.2)

## 2022-02-13 LAB — BRAIN NATRIURETIC PEPTIDE: B Natriuretic Peptide: >4500 pg/mL — ABNORMAL HIGH (ref 0.0–100.0)

## 2022-02-13 MED ORDER — ACETAMINOPHEN 650 MG RE SUPP: 650.0000 mg | Freq: Four times a day (QID) | RECTAL | Status: DC | PRN

## 2022-02-13 MED ORDER — PANTOPRAZOLE SODIUM 40 MG PO TBEC
40.0000 mg | DELAYED_RELEASE_TABLET | Freq: Two times a day (BID) | ORAL | Status: DC
  Administered 2022-02-14 (×2): 40 mg via ORAL
  Filled 2022-02-13 (×2): qty 1

## 2022-02-13 MED ORDER — RENA-VITE PO TABS
1.0000 | ORAL_TABLET | Freq: Every day | ORAL | Status: DC
  Administered 2022-02-14: 1 via ORAL
  Filled 2022-02-13: qty 1

## 2022-02-13 MED ORDER — SODIUM CHLORIDE 0.9 % IV SOLN
100.0000 mL | INTRAVENOUS | Status: DC | PRN
Start: 2022-02-13 — End: 2022-02-15

## 2022-02-13 MED ORDER — SODIUM CHLORIDE 0.9% FLUSH
3.0000 mL | Freq: Two times a day (BID) | INTRAVENOUS | Status: DC
  Administered 2022-02-13 – 2022-02-14 (×3): 3 mL via INTRAVENOUS

## 2022-02-13 MED ORDER — PENTAFLUOROPROP-TETRAFLUOROETH EX AERO
1.0000 "application " | INHALATION_SPRAY | CUTANEOUS | Status: DC | PRN
  Filled 2022-02-13: qty 30

## 2022-02-13 MED ORDER — ACETAMINOPHEN 325 MG PO TABS
650.0000 mg | ORAL_TABLET | Freq: Four times a day (QID) | ORAL | Status: DC | PRN
Start: 2022-02-13 — End: 2022-02-15

## 2022-02-13 MED ORDER — LIDOCAINE-PRILOCAINE 2.5-2.5 % EX CREA
1.0000 "application " | TOPICAL_CREAM | CUTANEOUS | Status: DC | PRN
  Filled 2022-02-13: qty 5

## 2022-02-13 MED ORDER — NICOTINE 7 MG/24HR TD PT24
7.0000 mg | MEDICATED_PATCH | Freq: Every day | TRANSDERMAL | Status: DC
  Administered 2022-02-13: 7 mg via TRANSDERMAL
  Filled 2022-02-13 (×2): qty 1

## 2022-02-13 MED ORDER — CARVEDILOL 6.25 MG PO TABS
6.2500 mg | ORAL_TABLET | Freq: Two times a day (BID) | ORAL | Status: DC
  Administered 2022-02-14 (×2): 6.25 mg via ORAL
  Filled 2022-02-13 (×2): qty 1

## 2022-02-13 MED ORDER — OXYCODONE HCL 5 MG PO TABS
5.0000 mg | ORAL_TABLET | Freq: Three times a day (TID) | ORAL | Status: DC | PRN
  Administered 2022-02-14 – 2022-02-15 (×3): 5 mg via ORAL
  Filled 2022-02-13 (×3): qty 1

## 2022-02-13 MED ORDER — ALBUTEROL SULFATE (2.5 MG/3ML) 0.083% IN NEBU: 2.5000 mg | INHALATION_SOLUTION | RESPIRATORY_TRACT | Status: DC | PRN

## 2022-02-13 MED ORDER — HEPARIN SODIUM (PORCINE) 1000 UNIT/ML DIALYSIS
1000.0000 [IU] | INTRAMUSCULAR | Status: DC | PRN
  Filled 2022-02-13: qty 1

## 2022-02-13 MED ORDER — SEVELAMER CARBONATE 800 MG PO TABS
800.0000 mg | ORAL_TABLET | Freq: Two times a day (BID) | ORAL | Status: DC
  Administered 2022-02-14 (×2): 800 mg via ORAL
  Filled 2022-02-13 (×2): qty 1

## 2022-02-13 MED ORDER — SODIUM CHLORIDE 0.9 % IV SOLN
250.0000 mL | INTRAVENOUS | Status: DC | PRN
Start: 2022-02-13 — End: 2022-02-15

## 2022-02-13 MED ORDER — DILTIAZEM HCL ER COATED BEADS 360 MG PO CP24
360.0000 mg | ORAL_CAPSULE | Freq: Every day | ORAL | Status: DC
  Administered 2022-02-14: 360 mg via ORAL
  Filled 2022-02-13 (×2): qty 1

## 2022-02-13 MED ORDER — LIDOCAINE HCL (PF) 1 % IJ SOLN
5.0000 mL | INTRAMUSCULAR | Status: DC | PRN
  Filled 2022-02-13: qty 5

## 2022-02-13 MED ORDER — SODIUM CHLORIDE 0.9% FLUSH: 3.0000 mL | INTRAVENOUS | Status: DC | PRN

## 2022-02-13 MED ORDER — CHLORHEXIDINE GLUCONATE CLOTH 2 % EX PADS
6.0000 | MEDICATED_PAD | Freq: Every day | CUTANEOUS | Status: DC
  Administered 2022-02-14 – 2022-02-15 (×2): 6 via TOPICAL

## 2022-02-13 MED ORDER — HYDROCODONE-ACETAMINOPHEN 5-325 MG PO TABS
1.0000 | ORAL_TABLET | ORAL | Status: DC | PRN
  Administered 2022-02-14: 2 via ORAL
  Filled 2022-02-13: qty 2

## 2022-02-13 MED ORDER — SODIUM CHLORIDE 0.9 % IV SOLN: 100.0000 mL | INTRAVENOUS | Status: DC | PRN

## 2022-02-13 MED ORDER — ALTEPLASE 2 MG IJ SOLR: 2.0000 mg | Freq: Once | INTRAMUSCULAR | Status: DC | PRN

## 2022-02-13 NOTE — Progress Notes (Signed)
Talked with Timothy [name] ED taking care of pt about pt's elevated BP and not being tx thus far. Nurse stated the doctor is aware and isn't going to tx it because she's just waiting on HD to take care of it. ?

## 2022-02-13 NOTE — Assessment & Plan Note (Signed)
In the setting of fluid overload, hypoxic on room air, ?Treat with hemodialysis ?Continue BiPAP for tonight ?

## 2022-02-13 NOTE — Progress Notes (Signed)
RT transported patient from ED to 5W without any complications 

## 2022-02-13 NOTE — ED Notes (Signed)
IV start attempted x 1 ?

## 2022-02-13 NOTE — Assessment & Plan Note (Signed)
Elevated troponin no EKG changes no chest pain similar to baseline in the setting of CKD continue to monitor ?

## 2022-02-13 NOTE — Progress Notes (Signed)
RT took patient off Bipap for a trial on [place] per MD request.  Patient states his breathing is ok but he has increased RR, WOB, and coarse crackles.  MD called to bedside, MD accessing now. ?

## 2022-02-13 NOTE — H&P (Signed)
? ? ?Timothy [name] PXT:[id number] DOB: 25-Jul-1961 DOA: 02/13/2022 ?  ?PCP: [name], MD   ?  ? ?Patient arrived to ER on 02/13/22 at 1549 ?Referred by Attending Toy Baker, MD ? ? ?Patient coming from:   ? home Lives  With family ?  ? ?Chief Complaint:   ?Chief Complaint  ?Patient presents with  ? Respiratory Distress  ? ? ?HPI: ?Timothy [name] is a 61 y.o. male with medical history significant of ESRD on HD t, H S, HTN history of drug abuse, chronic CHF, atrial fibrillation, history of bleeding duodenal ulcer status post surgical repair, anemia of chronic disease, history of colon cancer ?  ? ?Presented with worsening swelling ?Comes in for SOB and abdominal pain  ?Initially came on NR started on BiPAP ?Tried to wean him off ?HD unit will not take pt on BiPAP ?Reports that he has been compliant with his hemodialysis last session was on Saturday and he has completed it.  He does often get some cramping when he gets his hemodialysis. ?Last admission in December 17, 2021 when he required additional hemodialysis ?Reports chronic edema somewhat worse than baseline.  Has been eating a lot of ice ?Denies any chest pain ?Regarding pertinent Chronic problems:   ? ? Hyperlipidemia - not on statins ?Lipid Panel  ?   ?Component Value Date/Time  ? CHOL 242 (H) 07/29/2015 0943  ? TRIG 196 (H) 11/06/2021 0359  ? HDL 51 07/29/2015 0943  ? CHOLHDL 4.7 07/29/2015 0943  ? VLDL 34 (H) 07/29/2015 0943  ? LDLCALC 157 (H) 07/29/2015 0943  ? ? ? HTN on Coreg diltiazem ? ? chronic CHF diastolic/systolic/ combined - last echo December 2022 ?EF 40 to 45%.  Moderate concentric hypertrophy ?   ?  A. Fib -  - CHA2DS2 vas score 2  ?  ?  ?  Not on anticoagulation    ?       -  Rate control:  Currently controlled with  Coreg diltiazem ? ?End-stage renal disease on hemodialysis ?Lab Results  ?Component Value Date  ? CREATININE 8.00 (H) 02/13/2022  ? CREATININE 7.42 (H) 02/13/2022  ? CREATININE 5.12 (H) 12/31/2021  ? ?  Chronic anemia -  baseline hg Hemoglobin & Hematocrit secondary to CKD ?Recent Labs  ?  02/13/22 ?1646 02/13/22 ?1655 02/13/22 ?1656  ?HGB 10.0* 10.9* 11.2*  ? ? ? ?While in ER: ?  ? ?Noted to have evidence of fluid overload plan was for hemodialysis nephrology consulted. ?  ?  ? ?CXR -CHF pulmonary edema ?  ? ?Following Medications were ordered in ER: ?Medications  ?Chlorhexidine Gluconate Cloth 2 % PADS 6 each (has no administration in time range)  ?pentafluoroprop-tetrafluoroeth (GEBAUERS) aerosol 1 application. (has no administration in time range)  ?lidocaine (PF) (XYLOCAINE) 1 % injection 5 mL (has no administration in time range)  ?lidocaine-prilocaine (EMLA) cream 1 application. (has no administration in time range)  ?0.9 %  sodium chloride infusion (has no administration in time range)  ?0.9 %  sodium chloride infusion (has no administration in time range)  ?heparin injection 1,000 Units (has no administration in time range)  ?alteplase (CATHFLO ACTIVASE) injection 2 mg (has no administration in time range)  ?  ?_______________________________________________________ ?ER Provider Called:    Nephrology   Dr. Hollie Salk ?They Recommend admit to medicine   ? SEEN in ER ?  ?ED Triage Vitals  ?Enc Vitals Group  ?   BP 02/13/22 1558 (!) 163/102  ?   Pulse Rate  02/13/22 1558 78  ?   Resp 02/13/22 1558 (!) 33  ?   Temp 02/13/22 1558 97.7 ?F (36.5 ?C)  ?   Temp src --   ?   SpO2 02/13/22 1558 92 %  ?   Weight 02/13/22 1600 182 lb (82.6 kg)  ?   Height 02/13/22 1600 '6\' 1"'$  (1.854 m)  ?   Head Circumference --   ?   Peak Flow --   ?   Pain Score 02/13/22 1559 8  ?   Pain Loc --   ?   Pain Edu? --   ?   Excl. in [place]? --   ?YNWG(95)@    ? _________________________________________ ?Significant initial  Findings: ?Abnormal Labs Reviewed  ?CBC WITH DIFFERENTIAL/PLATELET - Abnormal; Notable for the following components:  ?    Result Value  ? RBC 3.91 (*)   ? Hemoglobin 10.0 (*)   ? HCT 32.3 (*)   ? MCH 25.6 (*)   ? RDW 18.6 (*)   ? All other  components within normal limits  ?COMPREHENSIVE METABOLIC PANEL - Abnormal; Notable for the following components:  ? BUN 62 (*)   ? Creatinine, Ser 7.42 (*)   ? Calcium 8.7 (*)   ? Albumin 2.9 (*)   ? Total Bilirubin 1.4 (*)   ? GFR, Estimated 8 (*)   ? All other components within normal limits  ?BRAIN NATRIURETIC PEPTIDE - Abnormal; Notable for the following components:  ? B Natriuretic Peptide >4,500.0 (*)   ? All other components within normal limits  ?I-STAT VENOUS BLOOD GAS, ED - Abnormal; Notable for the following components:  ? pH, Ven 7.480 (*)   ? pCO2, Ven 41.9 (*)   ? pO2, Ven 67 (*)   ? Bicarbonate 31.2 (*)   ? Acid-Base Excess 7.0 (*)   ? Calcium, Ion 0.98 (*)   ? HCT 33.0 (*)   ? Hemoglobin 11.2 (*)   ? All other components within normal limits  ?I-STAT CHEM 8, ED - Abnormal; Notable for the following components:  ? BUN 70 (*)   ? Creatinine, Ser 8.00 (*)   ? Calcium, Ion 0.97 (*)   ? Hemoglobin 10.9 (*)   ? HCT 32.0 (*)   ? All other components within normal limits  ?TROPONIN I (HIGH SENSITIVITY) - Abnormal; Notable for the following components:  ? Troponin I (High Sensitivity) 111 (*)   ? All other components within normal limits  ? ?  ?_________________________ ?Troponin 111 ?ECG: Ordered ?Personally reviewed by me showing: ?HR : 78 ?Rhythm: Sinus rhythm ?Nonspecific repolarization abnormalities ?QTC 449 ?  ? ?The recent clinical data is shown below. ?Vitals:  ? 02/13/22 1745 02/13/22 1800 02/13/22 1824 02/13/22 1839  ?BP: (!) 179/106 (!) 183/101 (!) 148/128 (!) 182/109  ?Pulse: 76 75 77 75  ?Resp: (!) 31 (!) 35 (!) 25 (!) 29  ?Temp:      ?SpO2: 98% 97% 96% 96%  ?Weight:      ?Height:      ? ?  ?  ?WBC ? ?   ?Component Value Date/Time  ? WBC 6.5 02/13/2022 1646  ? LYMPHSABS 0.7 02/13/2022 1646  ? MONOABS 1.0 02/13/2022 1646  ? EOSABS 0.1 02/13/2022 1646  ? BASOSABS 0.0 02/13/2022 1646  ? ?   ? ?_______________________________________________ ?Hospitalist was called for admission for   Hypervolemia,   ?Acute on chronic heart failure, unspecified heart failure type ([place]) ?   ?ESRD (end stage renal disease) ([place]) ?   ? ?  The following Work up has been ordered so far: ? ?Orders Placed This Encounter  ?Procedures  ? DG Chest Portable 1 View  ? CT ABDOMEN PELVIS WO CONTRAST  ? CBC with Differential  ? Comprehensive metabolic panel  ? Brain natriuretic peptide  ? Protime-INR  ? CBC  ? Hepatitis B surface antigen  ? Hepatitis B surface antibody  ? Hepatitis B surface antibody,quantitative  ? Informed Consent Details: Physician/Practitioner Attestation; Transcribe to consent form and obtain patient signature  ? Pre-Hemodialysis Protocol - Day of Dialysis  ? Post-Dialysis Protocol - Day of Dialysis  ? Hemodialysis Treatment Protocol  ? Change HD cath dressing  ? no heparin  ? Cardiac monitoring  ? Consult to nephrology  ESRD  ? Consult for [place] Admission  ? I-Stat venous blood gas, [facility] ED only)  ? I-stat chem 8, ED (not at [facility] or [facility])  ? EKG 12-Lead  ? Hemodialysis inpatient  ? Place in observation (patient's expected length of stay will be less than 2 midnights)  ?  ? ?OTHER Significant initial  Findings: ? ?labs showing: ? ?  ?Recent Labs  ?Lab 02/13/22 ?1646 02/13/22 ?1655 02/13/22 ?1656  ?NA 139 138 139  ?K 4.5 4.4 4.4  ?CO2 26  --   --   ?GLUCOSE 90 88  --   ?BUN 62* 70*  --   ?CREATININE 7.42* 8.00*  --   ?CALCIUM 8.7*  --   --   ? ? ?Cr  * stable,  Up from baseline see below ?Lab Results  ?Component Value Date  ? CREATININE 8.00 (H) 02/13/2022  ? CREATININE 7.42 (H) 02/13/2022  ? CREATININE 5.12 (H) 12/31/2021  ? ? ?Recent Labs  ?Lab 02/13/22 ?1646  ?AST 28  ?ALT 16  ?ALKPHOS 109  ?BILITOT 1.4*  ?PROT 8.0  ?ALBUMIN 2.9*  ? ?Lab Results  ?Component Value Date  ? CALCIUM 8.7 (L) 02/13/2022  ? PHOS 1.9 (L) 12/01/2021  ? ?    ?   ?Plt: ?Lab Results  ?Component Value Date  ? PLT 194 02/13/2022  ? ? ? ?  ?COVID-19 Labs ? ?No results for input(s): DDIMER, FERRITIN, LDH, CRP in the last 72 hours. ? ?Lab  Results  ?Component Value Date  ? [place] NEGATIVE 12/30/2021  ? [place] NEGATIVE 11/12/2021  ? [place] NEGATIVE 11/04/2021  ? [place] NEGATIVE 10/31/2021  ? ?  ?  ? ?   ?Recent Labs  ?Lab 05/0

## 2022-02-13 NOTE — Subjective & Objective (Signed)
Comes in for SOB and abdominal pain  ?Initially came on NR started on BiPAP ?Tried to wean him off ?HD unit will not take pt on BiPAP ? ?

## 2022-02-13 NOTE — Consult Note (Signed)
Reason for Consult: To manage dialysis and dialysis related needs ?Referring Physician: Dr Roslynn Amble ? ?Timothy [name] is an 61 y.o. male.  ?HPI: PT is a 9M with ESRD on HD TTS, Afib, chronic systolic and diastolic CHF, bleeding duodenal ulcers s/p surgical repair who is now seen in consultation at the request of Dr Roslynn Amble for eval and recs re: management of ESRD and hypervolemia.  Pt experienced SOB which worsened throughout today and called EMS.  Noted to have respiratory distress and was placed on NRB --> biPaP in ED.  Tried to wean BiPaP without success.  CXR looks wet.  Trop is 111.  EKG without acute ischemia. BNP > 4500.  In this setting we are asked to see.   ? ?Pt reports going to HD on Saturday.  Completed rx.  Noted that this same presentation occurred about a month ago in the setting of high IDWG and inability to achieve EDW.  States he's been eating ice.   ? ?Reports SOB and some vague abd pain.  Has chronic LE edema.   ? ?Orders from last admit: ?TTS - [facility] ?3hrs71mn, BFR 450, DFR Auto 1.5, EDW 80kg, 2K/ 2Ca ?Heparin No heparin bolus ordered ?Mircera 225 mcg q2wks ?Hectorol 176m IV qHD ?Venofer '100mg'$  IV weekly ?Sevelamer '800mg'$  2 tabs with meals and 1 tab with snacks ? ?Past Medical History:  ?Diagnosis Date  ? Anemia of chronic kidney failure   ? BPH (benign prostatic hyperplasia)   ? [facility]  ? spouse states he had surgury for colon CA  ? [facility]  ? Hypertension   ? Hypertensive heart disease with chronic diastolic congestive heart failure ([place]11/2015  ? Polysubstance abuse ([place]  ? History of heroin and marijuana use  ? ? ?Past Surgical History:  ?Procedure Laterality Date  ? A/V FISTULAGRAM Left 12/02/2021  ? Procedure: A/V Fistulagram;  Surgeon: HaCherre RobinsMD;  [facility];  Service: Cardiovascular;  Laterality: Left;  ? ABDOMINAL SURGERY    ? AV FISTULA PLACEMENT Left 09/19/2016  ?  Procedure: Left arm Radiocephalic ARTERIOVENOUS (AV) FISTULA CREATION;  Surgeon: BrConrad BurlingtonMD;  Location: [facility] Service: Vascular;  Laterality: Left;  ? BASCILIC VEIN TRANSPOSITION Left 07/09/2017  ? Procedure: BRACHIOCEPHALIC FISTULA CREATION;  Surgeon: ChConrad BurlingtonMD;  [place]: [facility] Service: Vascular;  Laterality: Left;  ? COLON SURGERY  2014  ? ESOPHAGOGASTRODUODENOSCOPY (EGD) WITH PROPOFOL N/A 11/05/2021  ? Procedure: ESOPHAGOGASTRODUODENOSCOPY (EGD) WITH PROPOFOL;  Surgeon: HuCarol AdaMD;  Location: [facility] Service: Endoscopy;  Laterality: N/A;  ? ESOPHAGOGASTRODUODENOSCOPY (EGD) WITH PROPOFOL N/A 11/14/2021  ? Procedure: ESOPHAGOGASTRODUODENOSCOPY (EGD) WITH PROPOFOL;  Surgeon: DoSharyn CreamerMD;  Location: [facility] Service: Gastroenterology;  Laterality: N/A;  ? ESOPHAGOGASTRODUODENOSCOPY (EGD) WITH PROPOFOL N/A 11/16/2021  ? Procedure: ESOPHAGOGASTRODUODENOSCOPY (EGD) WITH PROPOFOL;  Surgeon: DoSharyn CreamerMD;  [place]: [facility] Service: Gastroenterology;  Laterality: N/A;  ? HEMOSTASIS CLIP PLACEMENT  11/16/2021  ? Procedure: HEMOSTASIS CLIP PLACEMENT;  Surgeon: DoSharyn CreamerMD;  [facility] Service: Gastroenterology;;  ? HEMOSTASIS CONTROL  11/05/2021  ? Procedure: HEMOSTASIS CONTROL;  Surgeon: HuCarol AdaMD;  Location: [place] Service: Endoscopy;;  ? HEMOSTASIS CONTROL  11/16/2021  ? Procedure: HEMOSTASIS CONTROL;  Surgeon: DoSharyn CreamerMD;  Location: [facility] Service: Gastroenterology;;  ? HOT HEMOSTASIS N/A 11/16/2021  ? Procedure: HOT  HEMOSTASIS (ARGON PLASMA COAGULATION/BICAP);  Surgeon: [name], MD;  Location: [place];  Service: Gastroenterology;  Laterality: N/A;  ? INSERTION OF DIALYSIS CATHETER N/A 09/19/2016  ? Procedure: INSERTION OF TUNNELED DIALYSIS CATHETER;  Surgeon: Conrad [place], MD;  Location: [place];  Service: Vascular;  Laterality: N/A;  ? IR [place]  11/16/2021  ? IR ANGIOGRAM  VISCERAL SELECTIVE  11/05/2021  ? IR ANGIOGRAM VISCERAL SELECTIVE  11/16/2021  ? IR ANGIOGRAM VISCERAL SELECTIVE  11/16/2021  ? IR EMBO ART  VEN HEMORR LYMPH EXTRAV  INC GUIDE ROADMAPPING  11/05/2021  ? IR EMBO ART  VEN HEMORR LYMPH EXTRAV  INC GUIDE ROADMAPPING  11/16/2021  ? IR GENERIC HISTORICAL  09/14/2016  ? IR US GUIDE VASC ACCESS RIGHT 09/14/2016 [name], DO [facility]-INTERV RAD  ? IR GENERIC HISTORICAL  09/14/2016  ? IR FLUORO GUIDE CV LINE RIGHT 09/14/2016 [name], DO [facility]  ? IR PARACENTESIS  06/22/2021  ? IR US GUIDE VASC ACCESS RIGHT  11/05/2021  ? IR US GUIDE VASC ACCESS RIGHT  11/16/2021  ? LAPAROTOMY N/A 05/23/2021  ? Procedure: EXPLORATORY LAPAROTOMY LYSIS ADHESIONS;  Surgeon: [name], MD;  Location: [place];  Service: General;  Laterality: N/A;  ? LAPAROTOMY N/A 11/20/2021  ? Procedure: EXPLORATORY LAPAROTOMY, REPAIR OF BLEEDING DUODENAL ULCER;  Surgeon: Dwan Bolt, MD;  Location: [place];  Service: General;  Laterality: N/A;  ? ORIF TIBIA PLATEAU Left 01/15/2018  ? Procedure: OPEN REDUCTION INTERNAL FIXATION (ORIF) TIBIAL PLATEAU;  Surgeon: Altamese Olivet, MD;  Location: [place];  Service: Orthopedics;  Laterality: Left;  ? SCLEROTHERAPY  11/05/2021  ? Procedure: SCLEROTHERAPY;  Surgeon: Carol Ada, MD;  Location: [place];  Service: Endoscopy;;  ? SCLEROTHERAPY  11/16/2021  ? Procedure: SCLEROTHERAPY;  Surgeon: [name], MD;  Location: [facility] ENDOSCOPY;  Service: Gastroenterology;;  ? TRANSTHORACIC ECHOCARDIOGRAM  07/2016  ?  EF 60-65%, No RWMA. Mod Concentric LVH - Gr 2 DD. Severe LA dilation. PAP ~35 mmHg (mild Pulm HTN)  --> no changes noted 1 month later  ? ? ?Family History  ?Problem Relation Age of Onset  ? Heart failure Mother   ?     Died at age 61.  ? Heart attack Mother 61  ? Hypertension Mother   ? Diabetes Mellitus II Mother   ? Other Father   ?     Unknown  ? Kidney failure Sister   ?     (Oldest Sister)  ? Other Other   ?     Multiple siblings have started her heart disease,  he is not sure of the details.  ? CAD Nephew   ? ? ?Social History:  reports that he has been smoking cigarettes. He has been smoking an average of .25 packs per day. He has never used smokeless tobacco. He reports current drug use. Frequency: 1.00 time per week. Drugs: Marijuana and Heroin. He reports that he does not drink alcohol. ? ?Allergies: No Known Allergies ? ?Medications: Prior to Admission: (Not in a hospital admission)  ? ? ?Results for orders placed or performed during the hospital encounter of 02/13/22 (from the past 48 hour(s))  ?CBC with Differential     Status: Abnormal  ? Collection Time: 02/13/22  4:46 PM  ?Result Value Ref Range  ? WBC 6.5 4.0 - 10.5 K/uL  ? RBC 3.91 (L) 4.22 - 5.81 MIL/uL  ? Hemoglobin 10.0 (L) 13.0 - 17.0 g/dL  ? HCT 32.3 (L)  39.0 - 52.0 %  ? MCV 82.6 80.0 - 100.0 fL  ? MCH 25.6 (L) 26.0 - 34.0 pg  ? MCHC 31.0 30.0 - 36.0 g/dL  ? RDW 18.6 (H) 11.5 - 15.5 %  ? Platelets 194 150 - 400 K/uL  ? nRBC 0.0 0.0 - 0.2 %  ? Neutrophils Relative % 72 %  ? Neutro Abs 4.8 1.7 - 7.7 K/uL  ? Lymphocytes Relative 10 %  ? Lymphs Abs 0.7 0.7 - 4.0 K/uL  ? Monocytes Relative 15 %  ? Monocytes Absolute 1.0 0.1 - 1.0 K/uL  ? Eosinophils Relative 1 %  ? Eosinophils Absolute 0.1 0.0 - 0.5 K/uL  ? Basophils Relative 1 %  ? Basophils Absolute 0.0 0.0 - 0.1 K/uL  ? Immature Granulocytes 1 %  ? Abs Immature Granulocytes 0.03 0.00 - 0.07 K/uL  ?  Comment: Performed at [facility], [place] [address], [place], [place] [address]  ?Comprehensive metabolic panel     Status: Abnormal  ? Collection Time: 02/13/22  4:46 PM  ?Result Value Ref Range  ? Sodium 139 135 - 145 mmol/L  ? Potassium 4.5 3.5 - 5.1 mmol/L  ? Chloride 98 98 - 111 mmol/L  ? CO2 26 22 - 32 mmol/L  ? Glucose, Bld 90 70 - 99 mg/dL  ?  Comment: Glucose reference range applies only to samples taken after fasting for at least 8 hours.  ? BUN 62 (H) 8 - 23 mg/dL  ? Creatinine, Ser 7.42 (H) 0.61 - 1.24 mg/dL  ? Calcium 8.7 (L) 8.9 - 10.3 mg/dL  ?  Total Protein 8.0 6.5 - 8.1 g/dL  ? Albumin 2.9 (L) 3.5 - 5.0 g/dL  ? AST 28 15 - 41 U/L  ? ALT 16 0 - 44 U/L  ? Alkaline Phosphatase 109 38 - 126 U/L  ? Total Bilirubin 1.4 (H) 0.3 - 1.2 mg/dL  ? GFR, Estimate

## 2022-02-13 NOTE — Assessment & Plan Note (Signed)
Spoke about importance of avoiding drug abuse. ?

## 2022-02-13 NOTE — Progress Notes (Signed)
Patient transferred from ED to 5W26. Patient is alert and oriented to person, place, and situation. Telemetry monitoring enabled, vital signs taken, and IV assessed for patency. Skin checked with Cameron Sprang RN. Fall precautions initiated. Patient bed in the locked, lowest position. Non-slip socks in place and bed alarm on. Call bell is within reach. Patient knows to call for assistance prior to getting up and patient demonstrates use. Patient is laying comfortably in bed. ? ?

## 2022-02-13 NOTE — ED Provider Notes (Signed)
?[place] ?Provider Note ? ? ?CSN: [id number] ?Arrival date & time: 02/13/22  1549 ? ?  ? ?History ? ?Chief Complaint  ?Patient presents with  ? Respiratory Distress  ? ? ?Timothy [name] is a 61 y.o. male.  Presents to ER due to concern for shortness of breath.  Shortness of breath started today.  Also noted swelling in both of his legs.  States that he has not had any missed dialysis recently.  He had some chest tightness and some abdominal pain and abdominal distention.  No fever or cough lately. ? ?HPI ? ?  ? ?Home Medications ?Prior to Admission medications   ?Medication Sig Start Date End Date Taking? Authorizing Provider  ?acetaminophen (TYLENOL) 325 MG tablet Take 2 tablets (650 mg total) by mouth every 6 (six) hours as needed for mild pain or moderate pain. ?Patient not taking: Reported on 01/01/2022 12/02/21   Mercy Riding, MD  ?bisacodyl (DULCOLAX) 10 MG suppository Place 1 suppository (10 mg total) rectally daily as needed for moderate constipation. ?Patient not taking: Reported on 01/01/2022 12/02/21   Mercy Riding, MD  ?carvedilol (COREG) 6.25 MG tablet Take 1 tablet (6.25 mg total) by mouth 2 (two) times daily with a meal. 01/01/22   Lacinda Axon, MD  ?diltiazem (TIAZAC) 360 MG 24 hr capsule Take 360 mg by mouth daily.    [provider]  ?gabapentin (NEURONTIN) 100 MG capsule Take 1 capsule (100 mg total) by mouth daily. ?Patient not taking: Reported on 01/01/2022 12/02/21 01/31/22  Mercy Riding, MD  ?LIDOCAINE-PRILOCAINE EX Apply 1 application. topically Every Tuesday,Thursday,and Saturday with dialysis.    [provider]  ?multivitamin (RENA-VIT) TABS tablet Take 1 tablet by mouth at bedtime. 12/02/21   Mercy Riding, MD  ?oxyCODONE (OXY IR/ROXICODONE) 5 MG immediate release tablet Take 5 mg by mouth every 8 (eight) hours as needed for pain. 12/09/21   [provider]  ?pantoprazole (PROTONIX) 40 MG tablet Take 1 tablet (40 mg  total) by mouth 2 (two) times daily. 12/02/21   Mercy Riding, MD  ?sevelamer carbonate (RENVELA) 800 MG tablet Take 800 mg by mouth 2 (two) times daily with a meal. 11/14/21   [provider]  ?   ? ?Allergies    ?Patient has no known allergies.   ? ?Review of Systems   ?Review of Systems  ?Constitutional:  Negative for chills and fever.  ?HENT:  Negative for ear pain and sore throat.   ?Eyes:  Negative for pain and visual disturbance.  ?Respiratory:  Positive for shortness of breath. Negative for cough.   ?Cardiovascular:  Negative for chest pain and palpitations.  ?Gastrointestinal:  Positive for abdominal distention and abdominal pain. Negative for vomiting.  ?Genitourinary:  Negative for dysuria and hematuria.  ?Musculoskeletal:  Negative for arthralgias and back pain.  ?Skin:  Negative for color change and rash.  ?Neurological:  Negative for seizures and syncope.  ?All other systems reviewed and are negative. ? ?Physical Exam ?Updated Vital Signs ?BP (!) 148/128   Pulse 77   Temp 97.7 ?F (36.5 ?C)   Resp (!) 25   Ht '6\' 1"'$  (1.854 m)   Wt 82.6 kg   SpO2 96%   BMI 24.01 kg/m?  ?Physical Exam ?Vitals and nursing note reviewed.  ?Constitutional:   ?   General: He is not in acute distress. ?   Appearance: He is well-developed.  ?HENT:  ?   Head: Normocephalic and  atraumatic.  ?Eyes:  ?   Conjunctiva/sclera: Conjunctivae normal.  ?Cardiovascular:  ?   Rate and Rhythm: Normal rate and regular rhythm.  ?   Heart sounds: No murmur heard. ?Pulmonary:  ?   Effort: Pulmonary effort is normal. No respiratory distress.  ?   Breath sounds: Normal breath sounds.  ?Abdominal:  ?   Tenderness: There is abdominal tenderness.  ?   Comments: There is generalized tenderness to palpation but no rebound or guarding, some distention  ?Musculoskeletal:     ?   General: No swelling.  ?   Cervical back: Neck supple.  ?Skin: ?   General: Skin is warm and dry.  ?   Capillary Refill: Capillary refill takes less than 2 seconds.   ?Neurological:  ?   Mental Status: He is alert.  ?Psychiatric:     ?   Mood and Affect: Mood normal.  ? ? ?ED Results / Procedures / Treatments   ?Labs ?(all labs ordered are listed, but only abnormal results are displayed) ?Labs Reviewed  ?CBC WITH DIFFERENTIAL/PLATELET - Abnormal; Notable for the following components:  ?    Result Value  ? RBC 3.91 (*)   ? Hemoglobin 10.0 (*)   ? HCT 32.3 (*)   ? MCH 25.6 (*)   ? RDW 18.6 (*)   ? All other components within normal limits  ?COMPREHENSIVE METABOLIC PANEL - Abnormal; Notable for the following components:  ? BUN 62 (*)   ? Creatinine, Ser 7.42 (*)   ? Calcium 8.7 (*)   ? Albumin 2.9 (*)   ? Total Bilirubin 1.4 (*)   ? GFR, Estimated 8 (*)   ? All other components within normal limits  ?BRAIN NATRIURETIC PEPTIDE - Abnormal; Notable for the following components:  ? B Natriuretic Peptide >4,500.0 (*)   ? All other components within normal limits  ?I-STAT VENOUS BLOOD GAS, ED - Abnormal; Notable for the following components:  ? pH, Ven 7.480 (*)   ? pCO2, Ven 41.9 (*)   ? pO2, Ven 67 (*)   ? Bicarbonate 31.2 (*)   ? Acid-Base Excess 7.0 (*)   ? Calcium, Ion 0.98 (*)   ? HCT 33.0 (*)   ? Hemoglobin 11.2 (*)   ? All other components within normal limits  ?I-STAT CHEM 8, ED - Abnormal; Notable for the following components:  ? BUN 70 (*)   ? Creatinine, Ser 8.00 (*)   ? Calcium, Ion 0.97 (*)   ? Hemoglobin 10.9 (*)   ? HCT 32.0 (*)   ? All other components within normal limits  ?TROPONIN I (HIGH SENSITIVITY) - Abnormal; Notable for the following components:  ? Troponin I (High Sensitivity) 111 (*)   ? All other components within normal limits  ?PROTIME-INR  ?HEPATITIS B SURFACE ANTIGEN  ?HEPATITIS B SURFACE ANTIBODY,QUALITATIVE  ?HEPATITIS B SURFACE ANTIBODY, QUANTITATIVE  ?TROPONIN I (HIGH SENSITIVITY)  ? ? ?EKG ?EKG Interpretation ? ?Date/Time:  Monday Feb 13 2022 15:58:51 EDT ?Ventricular Rate:  78 ?PR Interval:  173 ?QRS Duration: 107 ?QT Interval:  394 ?QTC  Calculation: 449 ?R Axis:   21 ?Text Interpretation: Sinus rhythm Nonspecific repolarization abnormalities Confirmed by Madalyn Rob [phone]) on 02/13/2022 4:17:20 PM ? ?Radiology ?DG Chest Portable 1 View ? ?Result Date: 02/13/2022 ?CLINICAL DATA:  Chest pain.  On dialysis EXAM: PORTABLE CHEST 1 VIEW COMPARISON:  12/30/2021 FINDINGS: Stable cardiomegaly. Aortic atherosclerosis. Pulmonary vascular congestion. Diffuse interstitial opacities bilaterally. Probable small right pleural effusion. No pneumothorax. IMPRESSION:  Findings most consistent with CHF with pulmonary edema. Electronically Signed   By: Davina Poke D.O.   On: 02/13/2022 16:20   ? ?Procedures ?Procedures  ? ? ?Medications Ordered in ED ?Medications  ?Chlorhexidine Gluconate Cloth 2 % PADS 6 each (has no administration in time range)  ?pentafluoroprop-tetrafluoroeth (GEBAUERS) aerosol 1 application. (has no administration in time range)  ?lidocaine (PF) (XYLOCAINE) 1 % injection 5 mL (has no administration in time range)  ?lidocaine-prilocaine (EMLA) cream 1 application. (has no administration in time range)  ?0.9 %  sodium chloride infusion (has no administration in time range)  ?0.9 %  sodium chloride infusion (has no administration in time range)  ?heparin injection 1,000 Units (has no administration in time range)  ?alteplase (CATHFLO ACTIVASE) injection 2 mg (has no administration in time range)  ? ? ?ED Course/ Medical Decision Making/ A&P ?  ?                        ?Medical Decision Making ?Amount and/or Complexity of Data Reviewed ?Labs: ordered. ?Radiology: ordered. ? ?Risk ?Decision regarding hospitalization. ? ? ?61 year old gentleman presenting to ER due to concern for shortness of breath.  ESRD on dialysis Tuesday Thursday Saturday.  On arrival here patient was initially on NRB, switched to our BiPAP.  His breathing has markedly improved with BiPAP, he is much more comfortable.  As a secondary complaint, patient also endorsed chest  discomfort and abdominal pain.  He had some generalized tenderness to his abdomen and CT abdomen pelvis was ordered.  Basic labs were reviewed, consistent with his end-stage renal disease.  No leukocytosis, no transaminitis.  T

## 2022-02-13 NOTE — Progress Notes (Signed)
Patient refused IV start at this time x2. RN made aware. ?

## 2022-02-13 NOTE — Assessment & Plan Note (Signed)
Allow permissive hypertension for tonight restart in the morning ?

## 2022-02-13 NOTE — Assessment & Plan Note (Addendum)
For in the setting of needing hemodialysis fluid overload.  Nephrology is aware plan to hemodialyzed as soon as patient is able to get to her room orders placed. ?Continue home medications once stable ?Order echogram cycle cardiac enzymes ?

## 2022-02-13 NOTE — Assessment & Plan Note (Signed)
Not on anticoagulation.  Continue Coreg and diltiazem when able to restart ?

## 2022-02-13 NOTE — Assessment & Plan Note (Signed)
Chronic stable continue to monitor 

## 2022-02-13 NOTE — ED Notes (Addendum)
Rn attempted Iv and was unsuccessful after the pt removed previous ultrasound IV, IV team consulted and Pt refused not cooperating with IV team. Admitting MD notified  ?

## 2022-02-13 NOTE — ED Triage Notes (Signed)
Patient from home dialysis tues thurs sat.  [place]chi throughout unable to tolerate cpap. On NRB.  Patient abd is distended.  ?

## 2022-02-13 NOTE — Progress Notes (Signed)
RT NOTE: ? ?Pt transported to 5W by RT. Report given to [place], RT.  ?

## 2022-02-13 NOTE — ED Notes (Signed)
Pt and family educated on the plan of care for the pt. Pt requesting to take off the BiPAP pt educated on the importance of the BiPAP and that when they tried to take him off his breathing because increasingly labored and he was tachepnic.Pt continued to express agitation with BiPAP but agreed to keep it on  ?

## 2022-02-13 NOTE — Progress Notes (Signed)
Due to increased RR and WOB, patient placed back on Bipap with a V60.  Patient tolerated well. ?

## 2022-02-13 NOTE — Assessment & Plan Note (Signed)
Nephrology aware plan for hemodialysis ASAP until then we will continue BiPAP ?

## 2022-02-14 ENCOUNTER — Encounter (HOSPITAL_COMMUNITY): Payer: Self-pay | Admitting: Internal Medicine

## 2022-02-14 ENCOUNTER — Observation Stay (HOSPITAL_COMMUNITY): Payer: Medicare Other

## 2022-02-14 ENCOUNTER — Inpatient Hospital Stay (HOSPITAL_COMMUNITY): Payer: Medicare Other

## 2022-02-14 DIAGNOSIS — J9601 Acute respiratory failure with hypoxia: Secondary | ICD-10-CM | POA: Diagnosis present

## 2022-02-14 DIAGNOSIS — Z992 Dependence on renal dialysis: Secondary | ICD-10-CM | POA: Diagnosis not present

## 2022-02-14 DIAGNOSIS — N186 End stage renal disease: Secondary | ICD-10-CM | POA: Diagnosis present

## 2022-02-14 DIAGNOSIS — E785 Hyperlipidemia, unspecified: Secondary | ICD-10-CM | POA: Diagnosis present

## 2022-02-14 DIAGNOSIS — Z66 Do not resuscitate: Secondary | ICD-10-CM | POA: Diagnosis present

## 2022-02-14 DIAGNOSIS — I10 Essential (primary) hypertension: Secondary | ICD-10-CM | POA: Diagnosis not present

## 2022-02-14 DIAGNOSIS — J9600 Acute respiratory failure, unspecified whether with hypoxia or hypercapnia: Secondary | ICD-10-CM | POA: Diagnosis present

## 2022-02-14 DIAGNOSIS — I132 Hypertensive heart and chronic kidney disease with heart failure and with stage 5 chronic kidney disease, or end stage renal disease: Secondary | ICD-10-CM | POA: Diagnosis present

## 2022-02-14 DIAGNOSIS — I5043 Acute on chronic combined systolic (congestive) and diastolic (congestive) heart failure: Secondary | ICD-10-CM | POA: Diagnosis present

## 2022-02-14 DIAGNOSIS — Z9049 Acquired absence of other specified parts of digestive tract: Secondary | ICD-10-CM | POA: Diagnosis not present

## 2022-02-14 DIAGNOSIS — I5042 Chronic combined systolic (congestive) and diastolic (congestive) heart failure: Secondary | ICD-10-CM | POA: Diagnosis not present

## 2022-02-14 DIAGNOSIS — Z841 Family history of disorders of kidney and ureter: Secondary | ICD-10-CM | POA: Diagnosis not present

## 2022-02-14 DIAGNOSIS — Z91119 Patient's noncompliance with dietary regimen due to unspecified reason: Secondary | ICD-10-CM | POA: Diagnosis not present

## 2022-02-14 DIAGNOSIS — N400 Enlarged prostate without lower urinary tract symptoms: Secondary | ICD-10-CM | POA: Diagnosis present

## 2022-02-14 DIAGNOSIS — D631 Anemia in chronic kidney disease: Secondary | ICD-10-CM | POA: Diagnosis present

## 2022-02-14 DIAGNOSIS — F17210 Nicotine dependence, cigarettes, uncomplicated: Secondary | ICD-10-CM | POA: Diagnosis present

## 2022-02-14 DIAGNOSIS — Z8249 Family history of ischemic heart disease and other diseases of the circulatory system: Secondary | ICD-10-CM | POA: Diagnosis not present

## 2022-02-14 DIAGNOSIS — K279 Peptic ulcer, site unspecified, unspecified as acute or chronic, without hemorrhage or perforation: Secondary | ICD-10-CM | POA: Diagnosis present

## 2022-02-14 DIAGNOSIS — Z79899 Other long term (current) drug therapy: Secondary | ICD-10-CM | POA: Diagnosis not present

## 2022-02-14 DIAGNOSIS — Z85038 Personal history of other malignant neoplasm of large intestine: Secondary | ICD-10-CM | POA: Diagnosis not present

## 2022-02-14 DIAGNOSIS — I480 Paroxysmal atrial fibrillation: Secondary | ICD-10-CM | POA: Diagnosis present

## 2022-02-14 DIAGNOSIS — M898X9 Other specified disorders of bone, unspecified site: Secondary | ICD-10-CM | POA: Diagnosis present

## 2022-02-14 LAB — COMPREHENSIVE METABOLIC PANEL
ALT: 15 U/L (ref 0–44)
AST: 20 U/L (ref 15–41)
Albumin: 2.8 g/dL — ABNORMAL LOW (ref 3.5–5.0)
Alkaline Phosphatase: 115 U/L (ref 38–126)
Anion gap: 15 (ref 5–15)
BUN: 65 mg/dL — ABNORMAL HIGH (ref 8–23)
CO2: 25 mmol/L (ref 22–32)
Calcium: 8.6 mg/dL — ABNORMAL LOW (ref 8.9–10.3)
Chloride: 99 mmol/L (ref 98–111)
Creatinine, Ser: 7.42 mg/dL — ABNORMAL HIGH (ref 0.61–1.24)
GFR, Estimated: 8 mL/min — ABNORMAL LOW (ref >60)
Glucose, Bld: 95 mg/dL (ref 70–99)
Potassium: 4.2 mmol/L (ref 3.5–5.1)
Sodium: 139 mmol/L (ref 135–145)
Total Bilirubin: 1.2 mg/dL (ref 0.3–1.2)
Total Protein: 7.9 g/dL (ref 6.5–8.1)

## 2022-02-14 LAB — CBC
HCT: 30.7 % — ABNORMAL LOW (ref 39.0–52.0)
Hemoglobin: 9.8 g/dL — ABNORMAL LOW (ref 13.0–17.0)
MCH: 25.8 pg — ABNORMAL LOW (ref 26.0–34.0)
MCHC: 31.9 g/dL (ref 30.0–36.0)
MCV: 80.8 fL (ref 80.0–100.0)
Platelets: 203 10*3/uL (ref 150–400)
RBC: 3.8 MIL/uL — ABNORMAL LOW (ref 4.22–5.81)
RDW: 18.5 % — ABNORMAL HIGH (ref 11.5–15.5)
WBC: 5.6 10*3/uL (ref 4.0–10.5)
nRBC: 0 % (ref 0.0–0.2)

## 2022-02-14 LAB — ECHOCARDIOGRAM COMPLETE
AR max vel: 2.94 cm2
AV Peak grad: 7.6 mmHg
Ao pk vel: 1.38 m/s
Area-P 1/2: 4.21 cm2
Calc EF: 56.9 %
Height: 73 in
MV M vel: 4.92 m/s
MV Peak grad: 96.6 mmHg
P 1/2 time: 411 msec
S' Lateral: 4.1 cm
Single Plane A2C EF: 55.6 %
Single Plane A4C EF: 56.8 %
Weight: 2726.65 oz

## 2022-02-14 LAB — TROPONIN I (HIGH SENSITIVITY)
Troponin I (High Sensitivity): 114 ng/L (ref <18)
Troponin I (High Sensitivity): 123 ng/L (ref <18)

## 2022-02-14 LAB — HEPATITIS B SURFACE ANTIBODY,QUALITATIVE: Hep B S Ab: REACTIVE — AB

## 2022-02-14 LAB — HEPATITIS B SURFACE ANTIGEN: Hepatitis B Surface Ag: NONREACTIVE

## 2022-02-14 MED ORDER — NEPRO/CARBSTEADY PO LIQD: 237.0000 mL | Freq: Two times a day (BID) | ORAL | Status: DC

## 2022-02-14 NOTE — Progress Notes (Signed)
Initial Nutrition Assessment ? ?DOCUMENTATION CODES:  ? ?Not applicable ? ?INTERVENTION:  ? ?Continue Renal Multivitamin w/ minerals daily ?Nepro Shake po BID, each supplement provides 425 kcal and 19 grams protein ? ?NUTRITION DIAGNOSIS:  ? ?Increased nutrient needs related to chronic illness (CHF, ESRD) as evidenced by estimated needs. ? ?GOAL:  ? ?Patient will meet greater than or equal to 90% of their needs ? ?MONITOR:  ? ?PO intake, Supplement acceptance, Labs, Weight trends, I & O's ? ?REASON FOR ASSESSMENT:  ? ?Consult ?Assessment of nutrition requirement/status ? ?ASSESSMENT:  ? ?61 y.o. male presented to the ED with increased swelling, shortness of breath, and abdominal pain. PMH includes ESRD on HD (TTS), CHF, drug abuse, HTN, colon cancer s/p surgery, and malnutrition. Pt admitted with hypervolemia, acute on chronic CHF, and ESRD.  ? ?RD attempted to visit pt x2.  ? ?Per Nephrology's note, pt has been eating ice.  ? ?Per EMR, pt has had a weight gain over the past year. Although, suspect that pt has had actual dry weight loss due to post-dialysis weight being lower than EDW.  ? ?RD to provide pt with ONS to assist any further loss of lean muscle mass. ? ?Medications reviewed and include: Rena-Vit, Protonix, Renvela ?Labs reviewed. ? ?HD on 5/02 ?EDW: 80 kg ?Net UF: 4000 mL  ?Post- Dialysis Weight: 77.3 kg  ? ?NUTRITION - FOCUSED PHYSICAL EXAM: ? ?Deferred to follow-up.  ? ?Diet Order:   ?Diet Order   ? ?       ?  Diet renal with fluid restriction Fluid restriction: 1200 mL Fluid; Room service appropriate? Yes; Fluid consistency: Thin  Diet effective now       ?  ? ?  ?  ? ?  ? ?EDUCATION NEEDS:  ? ?No education needs have been identified at this time ? ?Skin:  Skin Assessment: Reviewed RN Assessment ? ?Last BM:  4/30 ? ?Height:  ?Ht Readings from Last 1 Encounters:  ?02/13/22 '6\' 1"'$  (1.854 m)  ? ?Weight:  ?Wt Readings from Last 1 Encounters:  ?02/14/22 77.3 kg  ? ?Ideal Body Weight:  83.6 kg ? ?BMI:  Body  mass index is 22.48 kg/m?. ? ?Estimated Nutritional Needs:  ?Kcal:  2100-2300 ?Protein:  105-120 grams ?Fluid:  UOP + 1L ? ? ? ?Hermina Barters RD, LDN ?Clinical Dietitian ?See AMiON for contact information.  ? ?

## 2022-02-14 NOTE — Progress Notes (Signed)
Pt requesting ice water and ice beyond daily fluid restrictions. Pt educated on the importance of maintaining fluid restrictions and insists to drink beyond limits. ?

## 2022-02-14 NOTE — Progress Notes (Signed)
Hemodialysis done. Post standing wt 77.3kg ?

## 2022-02-14 NOTE — Progress Notes (Signed)
Pt states he is not wearing BIPAP tonight.  No distress noted at this time. ?

## 2022-02-14 NOTE — Progress Notes (Addendum)
Pt receives out-pt HD at [facility] on TTS. Contacted clinic regarding pt's arrival/chair time and awaiting response. Will assist as needed.  ? ?[name] ?Renal Navigator ?[phone] ? ?Addendum at 12:10 pm: ?Pt has a 6:00 chair time at [facility] on TTS.  ?

## 2022-02-14 NOTE — Progress Notes (Signed)
?  Transition of Care (TOC) Screening Note ? ? ?Patient Details  ?Name: Timothy [name] ?Date of Birth: 05-04-1961 ? ? ?Transition of Care (TOC) CM/SW Contact:    ?Cyndi Bender, RN ?Phone Number: ?02/14/2022, 10:32 AM ? ? ? ?Transition of Care Department [facility]) has reviewed patient and no TOC needs have been identified at this time. We will continue to monitor patient advancement through interdisciplinary progression rounds. If new patient transition needs arise, please place a TOC consult. ? ? ?

## 2022-02-14 NOTE — Progress Notes (Addendum)
?      ?                 PROGRESS NOTE ? ?      ?PATIENT DETAILS ?Name: Timothy [name] ?Age: 61 y.o. ?Sex: male ?Date of Birth: [birth year] ?Admit Date: 02/13/2022 ?Admitting Physician Toy Baker, MD ?[id number], Iona Beard, MD ? ?Brief Summary: ?Patient is a 61 y.o.  male with history of ESRD on HD TTS, HTN, peptic ulcer disease-requiring surgical repair-presented to the hospital with shortness of breath-he was found to have acute hypoxic respiratory failure due to pulmonary edema in the setting of dialysis and noncompliance with fluid restriction.  He was started on BiPAP-and subsequently admitted to the hospitalist service. ? ?Significant events: ?5/1>> admit for severe hypoxemia-on BiPAP-pulm edema in the setting of noncompliance to fluid restriction-history of ESRD. ? ?Significant studies: ?5/1>> CXR: Findings most consistent with pulm edema. ? ?Significant microbiology data: ?None ? ?Procedures: ?None ? ?Consults: ?Nephrology ? ?Subjective: ?Lying comfortably in bed-denies any chest pain or shortness of breath.  Liberated off BiPAP this morning but on 4 5 L of oxygen. ? ?Objective: ?Vitals: ?Blood pressure (!) 162/89, pulse 72, temperature (!) 97.5 ?F (36.4 ?C), temperature source Temporal, resp. rate (!) 24, height '6\' 1"'$  (1.854 m), weight 77.3 kg, SpO2 97 %.  ? ?Exam: ?Gen Exam:Alert awake-not in any distress ?HEENT:atraumatic, normocephalic ?Chest: B/L clear to auscultation anteriorly ?CVS:S1S2 regular ?Abdomen:soft non tender, non distended ?Extremities:+ edema ?Neurology: Non focal ?Skin: no rash ? ?Pertinent Labs/Radiology: ? ?  Latest Ref Rng & Units 02/14/2022  ? 12:39 AM 02/13/2022  ?  9:19 PM 02/13/2022  ?  4:56 PM  ?CBC  ?WBC 4.0 - 10.5 K/uL 5.6   5.9     ?Hemoglobin 13.0 - 17.0 g/dL 9.8   9.5   11.2    ?Hematocrit 39.0 - 52.0 % 30.7   31.2   33.0    ?Platelets 150 - 400 K/uL 203   206     ?  ?Lab Results  ?Component Value Date  ? NA 139 02/14/2022  ? K 4.2 02/14/2022  ? CL 99 02/14/2022  ? CO2 25  02/14/2022  ?  ? ? ?Assessment/Plan: ?Acute hypoxic respiratory failure due to acute on chronic HFrEF in a setting of ESRD on HD TTS-noncompliant with fluid restriction: Liberated off BiPAP this morning-still on 4- 5 L of oxygen-s/p HD this morning.  Discussed with nephrology-patient still not at dry weight-and remains volume overloaded-recommendations are to continue hospitalization and undergo hemodialysis again tomorrow.  We will try to titrate down oxygen further. ? ?ESRD on HD TTS: Nephrology following-see above. ? 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 ? ?PAF: Maintaining sinus rhythm-continue Coreg/diltiazem-given recent history of life-threatening upper GI bleeding requiring surgical intervention-not on anticoagulation. ? ?HTN: BP reasonable-continue diltiazem/Coreg ? ?Minimally elevated troponin: Of no clinical significance-not consistent with ACS. ? ?Peptic ulcer disease: Continue PPI ? ?BMI: ?Estimated body mass index is 22.48 kg/m? as calculated from the following: ?  Height as of this encounter: '6\' 1"'$  (1.854 m). ?  Weight as of this encounter: 77.3 kg.  ? ?Code status: ?  Code Status: DNR  ? ?DVT Prophylaxis: ?SCDs Start: 02/13/22 2153 ?  ?Family Communication: None at bedside ? ? ?Disposition Plan: ?Status is: Observation ?The patient will require care spanning > 2 midnights and should be moved to inpatient because: Persistent volume overload-improving hypoxia-plan is to repeat HD tomorrow before consideration of discharge. ?  ?Planned Discharge Destination:Home ? ? ?  Diet: ?Diet Order   ? ?       ?  Diet renal with fluid restriction Fluid restriction: 1200 mL Fluid; Room service appropriate? Yes; Fluid consistency: Thin  Diet effective now       ?  ? ?  ?  ? ?  ?  ? ? ?Antimicrobial agents: ?Anti-infectives (From admission, onward)  ? ? None  ? ?  ? ? ? ?MEDICATIONS: ?Scheduled Meds: ? carvedilol  6.25 mg Oral BID WC  ? Chlorhexidine Gluconate Cloth  6 each Topical  Q0600  ? diltiazem  360 mg Oral Daily  ? multivitamin  1 tablet Oral QHS  ? nicotine  7 mg Transdermal Daily  ? pantoprazole  40 mg Oral BID  ? sevelamer carbonate  800 mg Oral BID WC  ? sodium chloride flush  3 mL Intravenous Q12H  ? ?Continuous Infusions: ? sodium chloride    ? sodium chloride    ? sodium chloride    ? ?PRN Meds:.sodium chloride, sodium chloride, sodium chloride, acetaminophen **OR** acetaminophen, albuterol, alteplase, heparin, HYDROcodone-acetaminophen, lidocaine (PF), lidocaine-prilocaine, oxyCODONE, pentafluoroprop-tetrafluoroeth, sodium chloride flush ? ? ?I have personally reviewed following labs and imaging studies ? ?LABORATORY DATA: ?CBC: ?Recent Labs  ?Lab 02/13/22 ?1646 02/13/22 ?1655 02/13/22 ?1656 02/13/22 ?2119 02/14/22 ?4287  ?WBC 6.5  --   --  5.9 5.6  ?NEUTROABS 4.8  --   --   --   --   ?HGB 10.0* 10.9* 11.2* 9.5* 9.8*  ?HCT 32.3* 32.0* 33.0* 31.2* 30.7*  ?MCV 82.6  --   --  83.2 80.8  ?PLT 194  --   --  206 203  ? ? ?Basic Metabolic Panel: ?Recent Labs  ?Lab 02/13/22 ?1646 02/13/22 ?1655 02/13/22 ?1656 02/14/22 ?6811  ?NA 139 138 139 139  ?K 4.5 4.4 4.4 4.2  ?CL 98 101  --  99  ?CO2 26  --   --  25  ?GLUCOSE 90 88  --  95  ?BUN 62* 70*  --  65*  ?CREATININE 7.42* 8.00*  --  7.42*  ?CALCIUM 8.7*  --   --  8.6*  ? ? ?GFR: ?Estimated Creatinine Clearance: 11.4 mL/min (A) (by C-G formula based on SCr of 7.42 mg/dL (H)). ? ?Liver Function Tests: ?Recent Labs  ?Lab 02/13/22 ?1646 02/14/22 ?5726  ?AST 28 20  ?ALT 16 15  ?ALKPHOS 109 115  ?BILITOT 1.4* 1.2  ?PROT 8.0 7.9  ?ALBUMIN 2.9* 2.8*  ? ?No results for input(s): LIPASE, AMYLASE in the last 168 hours. ?No results for input(s): AMMONIA in the last 168 hours. ? ?Coagulation Profile: ?Recent Labs  ?Lab 02/13/22 ?2119  ?INR 1.4*  ? ? ?Cardiac Enzymes: ?No results for input(s): CKTOTAL, CKMB, CKMBINDEX, TROPONINI in the last 168 hours. ? ?BNP (last 3 results) ?No results for input(s): PROBNP in the last 8760 hours. ? ?Lipid Profile: ?No  results for input(s): CHOL, HDL, LDLCALC, TRIG, CHOLHDL, LDLDIRECT in the last 72 hours. ? ?Thyroid Function Tests: ?No results for input(s): TSH, T4TOTAL, FREET4, T3FREE, THYROIDAB in the last 72 hours. ? ?Anemia Panel: ?No results for input(s): VITAMINB12, FOLATE, FERRITIN, TIBC, IRON, RETICCTPCT in the last 72 hours. ? ?Urine analysis: ?   ?Component Value Date/Time  ? [place] YELLOW 05/19/2021 2015  ? APPEARANCEUR HAZY (A) 05/19/2021 2015  ? LABSPEC 1.012 05/19/2021 2015  ? PHURINE 9.0 (H) 05/19/2021 2015  ? [place] NEGATIVE 05/19/2021 2015  ? [place] NEGATIVE 05/19/2021 2015  ? [place] NEGATIVE 05/19/2021 2015  ? [place] Stain  NEGATIVE 05/19/2021 2015  ? PROTEINUR >=300 (A) 05/19/2021 2015  ? NITRITE NEGATIVE 05/19/2021 2015  ? LEUKOCYTESUR SMALL (A) 05/19/2021 2015  ? ? ?Sepsis Labs: ?Lactic Acid, Venous ?   ?Component Value Date/Time  ? LATICACIDVEN 1.4 06/11/2021 1427  ? ? ?MICROBIOLOGY: ?No results found for this or any previous visit (from the past 240 hour(s)). ? ?RADIOLOGY STUDIES/RESULTS: ?DG Chest Portable 1 View ? ?Result Date: 02/13/2022 ?CLINICAL DATA:  Chest pain.  On dialysis EXAM: PORTABLE CHEST 1 VIEW COMPARISON:  12/30/2021 FINDINGS: Stable cardiomegaly. Aortic atherosclerosis. Pulmonary vascular congestion. Diffuse interstitial opacities bilaterally. Probable small right pleural effusion. No pneumothorax. IMPRESSION: Findings most consistent with CHF with pulmonary edema. Electronically Signed   By: Davina Poke D.O.   On: 02/13/2022 16:20   ? ? LOS: 0 days  ? ?Oren Binet, MD  ?[facility] ? ? ? ?To contact the attending provider between 7A-7P or the covering provider during after hours 7P-7A, please log into the web site [email or web address] and access using universal [place] password for that web site. If you do not have the password, please call the hospital operator. ? ?02/14/2022, 11:31 AM ? ? ? ?

## 2022-02-14 NOTE — Progress Notes (Signed)
?[facility] ?Progress Note  ? ?Subjective:  Seen on HD in room -attempting 4L UF. On 2L [place]. Tolerating UF. Asking for ice water and something to eat.  ? ?Objective ?Vitals:  ? 02/14/22 0815 02/14/22 0830 02/14/22 0845 02/14/22 0900  ?BP: (!) 170/89 (!) 165/98 (!) 165/97 (!) 169/100  ?Pulse: 72 73 74 74  ?Resp: (!) 25 18 (!) 25 (!) 22  ?Temp:    97.8 ?F (36.6 ?C)  ?TempSrc:    Oral  ?SpO2:    97%  ?Weight:      ?Height:      ?  ? ?Additional Objective ?Labs: ?Basic Metabolic Panel: ?Recent Labs  ?Lab 02/13/22 ?1646 02/13/22 ?1655 02/13/22 ?1656 02/14/22 ?1962  ?NA 139 138 139 139  ?K 4.5 4.4 4.4 4.2  ?CL 98 101  --  99  ?CO2 26  --   --  25  ?GLUCOSE 90 88  --  95  ?BUN 62* 70*  --  65*  ?CREATININE 7.42* 8.00*  --  7.42*  ?CALCIUM 8.7*  --   --  8.6*  ? ?CBC: ?Recent Labs  ?Lab 02/13/22 ?1646 02/13/22 ?1655 02/13/22 ?1656 02/13/22 ?2119 02/14/22 ?2297  ?WBC 6.5  --   --  5.9 5.6  ?NEUTROABS 4.8  --   --   --   --   ?HGB 10.0*   < > 11.2* 9.5* 9.8*  ?HCT 32.3*   < > 33.0* 31.2* 30.7*  ?MCV 82.6  --   --  83.2 80.8  ?PLT 194  --   --  206 203  ? < > = values in this interval not displayed.  ? ?Blood Culture ?   ?Component Value Date/Time  ? SDES PERITONEAL FLUID 06/22/2021 1236  ? [place] ABDOMEN 06/22/2021 1236  ? CULT  06/22/2021 1236  ?  NO GROWTH 3 DAYS ?Performed at [facility], [place] [address], [place], [place] [address] ?  ? REPTSTATUS 06/26/2021 FINAL 06/22/2021 1236  ? ? ? ?Physical Exam ?General: Alert, nad  ?Heart: RRR No m,r,g  ?Lungs: Faint crackles at bases  ?Abdomen: soft, distended, non-tender  ?Extremities: Trace LE edema ?Dialysis Access: LUE AVF  ? ?Medications: ? sodium chloride    ? sodium chloride    ? sodium chloride    ? ? carvedilol  6.25 mg Oral BID WC  ? Chlorhexidine Gluconate Cloth  6 each Topical Q0600  ? diltiazem  360 mg Oral Daily  ? multivitamin  1 tablet Oral QHS  ? nicotine  7 mg Transdermal Daily  ? pantoprazole  40 mg Oral BID  ? sevelamer carbonate  800  mg Oral BID WC  ? sodium chloride flush  3 mL Intravenous Q12H  ? ? ?Dialysis Orders:  ?TTS - [facility] ?3hrs44mn, BFR 450, DFR Auto 1.5, EDW 80kg, 2K/ 2Ca ?Heparin No heparin bolus ordered ?Mircera 225 mcg q2wks ?Hectorol 189m IV qHD ?Venofer '100mg'$  IV weekly ?Sevelamer '800mg'$  2 tabs with meals and 1 tab with snacks ?  ? ?Assessment/Plan: ?1 Acute hypoxic RF: in the setting of volume overload.  Emergent HD this am. Attempting 4L UF today. May need extra HD tomorrow. Will reassess in the am.  ?2 [facility] Has a history of high IDWG- discussed.  No heparin in the setting of recent surgery for bleeding duodenal ulcers ?3 Hypertension/volume: Max UF as tolerated- at least 4L.  Expect BP to improve with this.  Will need max UF at  dialysis and hopefully can lower EDW ?4. Anemia of ESRD: Hgb 10.0 here today ?5. Metabolic Bone Disease:  binders when eating ?6.  Abd pain: CT abd/ pelvis pending ?7. Dispo: Obs admit  ?  ? ?Lynnda Child PA-C ?[facility] ?02/14/2022,9:11 AM ? ? ? ? ? ? ? ?

## 2022-02-15 LAB — RENAL FUNCTION PANEL
Albumin: 2.6 g/dL — ABNORMAL LOW (ref 3.5–5.0)
Anion gap: 14 (ref 5–15)
BUN: 57 mg/dL — ABNORMAL HIGH (ref 8–23)
CO2: 27 mmol/L (ref 22–32)
Calcium: 8 mg/dL — ABNORMAL LOW (ref 8.9–10.3)
Chloride: 95 mmol/L — ABNORMAL LOW (ref 98–111)
Creatinine, Ser: 6.55 mg/dL — ABNORMAL HIGH (ref 0.61–1.24)
GFR, Estimated: 9 mL/min — ABNORMAL LOW (ref >60)
Glucose, Bld: 118 mg/dL — ABNORMAL HIGH (ref 70–99)
Phosphorus: 4.5 mg/dL (ref 2.5–4.6)
Potassium: 3.5 mmol/L (ref 3.5–5.1)
Sodium: 136 mmol/L (ref 135–145)

## 2022-02-15 LAB — CBC
HCT: 29.4 % — ABNORMAL LOW (ref 39.0–52.0)
Hemoglobin: 9.3 g/dL — ABNORMAL LOW (ref 13.0–17.0)
MCH: 25.6 pg — ABNORMAL LOW (ref 26.0–34.0)
MCHC: 31.6 g/dL (ref 30.0–36.0)
MCV: 81 fL (ref 80.0–100.0)
Platelets: 218 10*3/uL (ref 150–400)
RBC: 3.63 MIL/uL — ABNORMAL LOW (ref 4.22–5.81)
RDW: 18.5 % — ABNORMAL HIGH (ref 11.5–15.5)
WBC: 4.9 10*3/uL (ref 4.0–10.5)
nRBC: 0 % (ref 0.0–0.2)

## 2022-02-15 LAB — HEPATITIS B SURFACE ANTIBODY, QUANTITATIVE: Hep B S AB Quant (Post): 51.2 m[IU]/mL (ref >9.9)

## 2022-02-15 MED ORDER — HEPARIN SODIUM (PORCINE) 1000 UNIT/ML DIALYSIS: 1000.0000 [IU] | INTRAMUSCULAR | Status: DC | PRN

## 2022-02-15 MED ORDER — ALTEPLASE 2 MG IJ SOLR: 2.0000 mg | Freq: Once | INTRAMUSCULAR | Status: DC | PRN

## 2022-02-15 MED ORDER — DIPHENHYDRAMINE HCL 25 MG PO CAPS: 25.0000 mg | ORAL_CAPSULE | Freq: Three times a day (TID) | ORAL | Status: DC | PRN

## 2022-02-15 MED ORDER — PENTAFLUOROPROP-TETRAFLUOROETH EX AERO: 1.0000 "application " | INHALATION_SPRAY | CUTANEOUS | Status: DC | PRN

## 2022-02-15 MED ORDER — SODIUM CHLORIDE 0.9 % IV SOLN: 100.0000 mL | INTRAVENOUS | Status: DC | PRN

## 2022-02-15 MED ORDER — LIDOCAINE-PRILOCAINE 2.5-2.5 % EX CREA: 1.0000 "application " | TOPICAL_CREAM | CUTANEOUS | Status: DC | PRN

## 2022-02-15 MED ORDER — OXYCODONE HCL 5 MG PO TABS
5.0000 mg | ORAL_TABLET | Freq: Once | ORAL | Status: DC
Start: 2022-02-15 — End: 2022-02-15
  Filled 2022-02-15: qty 1

## 2022-02-15 MED ORDER — LIDOCAINE HCL (PF) 1 % IJ SOLN: 5.0000 mL | INTRAMUSCULAR | Status: DC | PRN

## 2022-02-15 NOTE — Progress Notes (Signed)
removed 3068ms net fluid no complaints no complications tolerated tx well. upon arrival pt wanted stack to cannulate in very thin white area of fistula,  pt became every upset when we wouldnt stick him there we explained the rationale at that noone should be sticking there.  pre bp 162/87 post bp 155/80 pre weight 81.4kg post weight 76.9kg standing  .  2 bandages to lua avf no bleeding dressing cdi. ? ? ?

## 2022-02-15 NOTE — Progress Notes (Signed)
Pt refused education, but accepted AVS paperwork. ?

## 2022-02-15 NOTE — Progress Notes (Signed)
Contacted [place] to make clinic aware of pt's d/c today and that pt will resume care tomorrow.  ? ?[name] ?Renal Navigator ?[phone] ?

## 2022-02-15 NOTE — Progress Notes (Signed)
Spoke with wife Angela Nevin to provided an update. Informed her of DC order for pt and fluid restrictions for pt.  ?

## 2022-02-15 NOTE — TOC Transition Note (Signed)
Transition of Care (TOC) - CM/SW Discharge Note ? ? ?Patient Details  ?Name: Timothy [name] ?MRN: [id number] ?Date of Birth: May 09, 1961 ? ?Transition of Care (TOC) CM/SW Contact:  ?Cyndi Bender, RN ?Phone Number: ?02/15/2022, 2:58 PM ? ? ?Clinical Narrative:    ?Patient is stable for discharge. Wife can transport home at 6pm. Wife helps him get to apts. No other TOC needs at this time. ? ? ?Final next level of care: Home/Self Care ?Barriers to Discharge: Barriers Resolved ? ? ?Patient Goals and CMS Choice ?Patient states their goals for this hospitalization and ongoing recovery are:: return home ?  ?  ? ?Discharge Placement ?  ?           ?  ? home ?  ?  ? ?Discharge Plan and Services ?  ?  ?           ? home ?  ?  ?  ?  ?  ?  ?  ?  ?  ? ?Social Determinants of Health (SDOH) Interventions ?  ? ? ?Readmission Risk Interventions ? ?  02/15/2022  ?  2:57 PM 11/16/2021  ?  2:19 PM 11/09/2021  ?  9:47 AM  ?Readmission Risk Prevention Plan  ?Transportation Screening Complete  Complete  ?Medication Review (RN Care Manager) Complete Referral to Pharmacy Referral to Pharmacy  ?PCP or Specialist appointment within 3-5 days of discharge Complete Not Complete Complete  ?PCP/Specialist Appt Not Complete comments  patient not medically ready to d/c   ?[place] or Home Care Consult Complete Not Complete Patient refused  ?[place] or Home Care Consult Pt Refusal Comments  not complete, waiting on PT recs   ?SW Recovery Care/Counseling Consult Complete Complete Complete  ?Palliative Care Screening Not Applicable Not Applicable Not Applicable  ?[place] Not Applicable Not Complete Not Applicable  ?SNF Comments  Pending PT recommendations (CSW following)   ? ? ? ? ? ?

## 2022-02-15 NOTE — Progress Notes (Signed)
?[facility] ?Progress Note  ? ?Subjective:  Had HD yesterday 4L removed. HD again today -seen in unit. UF goal 3L. No complaints this am. Denies cp, dyspnea.  ? ? ?Objective ?Vitals:  ? 02/15/22 0520 02/15/22 0805 02/15/22 0808 02/15/22 0830  ?BP: (!) 145/89 (!) 162/87 (!) 144/88 (!) 166/91  ?Pulse: 67 (!) 56 61   ?Resp: '16 16 20   '$ ?Temp: (!) 97.5 ?F (36.4 ?C) 98.3 ?F (36.8 ?C)    ?TempSrc: Oral     ?SpO2: 97%     ?Weight:  81.4 kg    ?Height:      ?  ? ?Additional Objective ?Labs: ?Basic Metabolic Panel: ?Recent Labs  ?Lab 02/13/22 ?1646 02/13/22 ?1655 02/13/22 ?1656 02/14/22 ?7793 02/15/22 ?0811  ?NA 139 138 139 139 136  ?K 4.5 4.4 4.4 4.2 3.5  ?CL 98 101  --  99 95*  ?CO2 26  --   --  25 27  ?GLUCOSE 90 88  --  95 118*  ?BUN 62* 70*  --  65* 57*  ?CREATININE 7.42* 8.00*  --  7.42* 6.55*  ?CALCIUM 8.7*  --   --  8.6* 8.0*  ?PHOS  --   --   --   --  4.5  ? ? ?CBC: ?Recent Labs  ?Lab 02/13/22 ?1646 02/13/22 ?1655 02/13/22 ?2119 02/14/22 ?9030 02/15/22 ?0811  ?WBC 6.5  --  5.9 5.6 4.9  ?NEUTROABS 4.8  --   --   --   --   ?HGB 10.0*   < > 9.5* 9.8* 9.3*  ?HCT 32.3*   < > 31.2* 30.7* 29.4*  ?MCV 82.6  --  83.2 80.8 81.0  ?PLT 194  --  206 203 218  ? < > = values in this interval not displayed.  ? ? ?Blood Culture ?   ?Component Value Date/Time  ? SDES PERITONEAL FLUID 06/22/2021 1236  ? [place] ABDOMEN 06/22/2021 1236  ? CULT  06/22/2021 1236  ?  NO GROWTH 3 DAYS ?Performed at [facility], [place] [address] [place],  [address] ?  ? REPTSTATUS 06/26/2021 FINAL 06/22/2021 1236  ? ? ? ?Physical Exam ?General: Alert, nad  ?Heart: RRR No m,r,g  ?Lungs: Faint crackles at bases  ?Abdomen: soft, distended, non-tender  ?Extremities: Trace LE edema ?Dialysis Access: LUE AVF  ? ?Medications: ? sodium chloride    ? sodium chloride    ? sodium chloride    ? ? carvedilol  6.25 mg Oral BID WC  ? Chlorhexidine Gluconate Cloth  6 each Topical Q0600  ? diltiazem  360 mg Oral Daily  ? feeding supplement  (NEPRO CARB STEADY)  237 mL Oral BID BM  ? multivitamin  1 tablet Oral QHS  ? nicotine  7 mg Transdermal Daily  ? pantoprazole  40 mg Oral BID  ? sevelamer carbonate  800 mg Oral BID WC  ? sodium chloride flush  3 mL Intravenous Q12H  ? ? ?Dialysis Orders:  ?TTS - [facility] ?3hrs52mn, BFR 450, DFR Auto 1.5, EDW 80kg, 2K/ 2Ca ?Heparin No heparin bolus ordered ?Mircera 225 mcg q2wks ?Hectorol 17m IV qHD ?Venofer '100mg'$  IV weekly ?Sevelamer '800mg'$  2 tabs with meals and 1 tab with snacks ?  ? ?Assessment/Plan: ?1 Acute hypoxic RF: in the setting of volume overload.  HD yesterday with 4L removed. On RA this am. HD again today for volume.   ?2 [facility] Has a history of high IDWG- discussed.  No heparin in the setting of recent surgery for bleeding duodenal ulcers.  ?3 Hypertension/volume: Max UF as tolerated.  Expect BP to improve with this.  Will need max UF at dialysis and hopefully can lower EDW. Got below EDW. Post HD wt 77.3kg on 5/2. Will lower EDW at discharge ?4. Anemia of ESRD: Hgb 9.3. On ESA as outpatient.  ?5. Metabolic Bone Disease:  binders when eating ?6. Abd pain: CT abd/ pelvis  ?7. Dispo: Should be ok for discharge after dialysis.  ?  ? ?Lynnda Child PA-C ?[facility] ?02/15/2022,8:48 AM ? ? ? ? ? ? ? ?

## 2022-02-15 NOTE — Progress Notes (Signed)
Pt back to unit from HD. Refused all morning med, but requested additional prn pain medication. MD notified. ?

## 2022-02-15 NOTE — Plan of Care (Signed)
?  Problem: Health Behavior/Discharge Planning: ?Goal: Ability to manage health-related needs will improve ?Outcome: Progressing ?  ?Problem: Clinical Measurements: ?Goal: Ability to maintain clinical measurements within normal limits will improve ?Outcome: Progressing ?Goal: Will remain free from infection ?Outcome: Progressing ?Goal: Respiratory complications will improve ?Outcome: Progressing ?Goal: Cardiovascular complication will be avoided ?Outcome: Progressing ?  ?Problem: Coping: ?Goal: Level of anxiety will decrease ?Outcome: Progressing ?  ?

## 2022-02-15 NOTE — Discharge Summary (Signed)
? ?PATIENT DETAILS ?Name: Timothy [name] ?Age: 61 y.o. ?Sex: male ?Date of Birth: [birth year] ?MRN: [id number]. ?Admitting Physician: Jonetta Osgood, MD ?[id number], Iona Beard, MD ? ?Admit Date: 02/13/2022 ?Discharge date: 02/15/2022 ? ?Recommendations for Outpatient Follow-up:  ?Follow up with PCP in 1-2 weeks ?Please obtain CMP/CBC in one week ? ?Admitted From:  ?Home ? ?Disposition: ?Home ?  ?Discharge Condition: ?good ? ?CODE STATUS: ?  Code Status: DNR  ? ?Diet recommendation:  ?Diet Order   ? ?       ?  Diet - low sodium heart healthy       ?  ?  Diet renal with fluid restriction Fluid restriction: 1200 mL Fluid; Room service appropriate? Yes; Fluid consistency: Thin  Diet effective now       ?  ? ?  ?  ? ?  ?  ? ?Brief Summary: ?Patient is a 61 y.o.  male with history of ESRD on HD TTS, HTN, peptic ulcer disease-requiring surgical repair-presented to the hospital with shortness of breath-he was found to have acute hypoxic respiratory failure due to pulmonary edema in the setting of dialysis and noncompliance with fluid restriction.  He was started on BiPAP-and subsequently admitted to the hospitalist service. ?  ?Significant events: ?5/1>> admit for severe hypoxemia-on BiPAP-pulm edema in the setting of noncompliance to fluid restriction-history of ESRD. ?  ?Significant studies: ?5/1>> CXR: Findings most consistent with pulm edema. ?  ?Significant microbiology data: ?None ?  ?Procedures: ?None ?  ?Consults: ?Nephrology ?  ? ?Brief Hospital Course: ?Acute hypoxic respiratory failure due to acute on chronic HFrEF in a setting of ESRD on HD TTS-noncompliant with fluid restriction: Initially on BiPAP-significantly better after HD.  Nephrology felt that patient needed to stay another night-to get hemodialysis today as he was still volume overloaded.  Per nephrology-stable to be discharged after hemodialysis today.  Etiology of hypoxemia/fluid overload is due to noncompliance with fluid restriction.  Patient has  been counseled extensively.  ? ?ESRD on HD TTS: Nephrology following-see above. ? 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 ? ?PAF: Maintaining sinus rhythm-continue Coreg/diltiazem-given recent history of life-threatening upper GI bleeding requiring surgical intervention-not on anticoagulation. ?  ?HTN: BP reasonable-continue diltiazem/Coreg ? ?Minimally elevated troponin: Of no clinical significance-not consistent with ACS. ?  ?Peptic ulcer disease: Continue PPI ?  ?BMI: ?Estimated body mass index is 22.48 kg/m? as calculated from the following: ?  Height as of this encounter: '6\' 1"'$  (1.854 m). ?  Weight as of this encounter: 77.3 kg.  ? ?Nutrition Status: ?Nutrition Problem: Increased nutrient needs ?Etiology: chronic illness (CHF, ESRD) ?Signs/Symptoms: estimated needs ?Interventions: MVI, Nepro shake ?  ? ?Discharge Diagnoses:  ?Active Problems: ?  Acute on chronic combined systolic and diastolic CHF (congestive heart failure) ([place]) ?  ESRD on HD TTS ?  Acute respiratory failure with hypoxia ([place]) ?  HTN (hypertension) ?  Anemia due to chronic kidney disease ?  Paroxysmal A-fib ([place]) ?  Substance abuse ([place]) ?  Elevated troponin ? ? ?Discharge Instructions: ? ?Activity:  ?As tolerated  ? ?Discharge Instructions   ? ? Call MD for:  difficulty breathing, headache or visual disturbances   Complete by: As directed ?  ? Diet - low sodium heart healthy   Complete by: As directed ?  ? Discharge instructions   Complete by: As directed ?  ? Follow with Primary MD  [name], MD in 1-2 weeks ? ?Please get a complete blood count and chemistry  panel checked by your Primary MD at your next visit, and again as instructed by your Primary MD. ? ?Get Medicines reviewed and adjusted: ?Please take all your medications with you for your next visit with your Primary MD ? ?Laboratory/radiological data: ?Please request your Primary MD to go over all hospital tests and procedure/radiological  results at the follow up, please ask your Primary MD to get all Hospital records sent to his/her office. ? ?In some cases, they will be blood work, cultures and biopsy results pending at the time of your discharge. Please request that your primary care M.D. follows up on these results. ? ?Also Note the following: ?If you experience worsening of your admission symptoms, develop shortness of breath, life threatening emergency, suicidal or homicidal thoughts you must seek medical attention immediately by calling 911 or calling your MD immediately  if symptoms less severe. ? ?You must read complete instructions/literature along with all the possible adverse reactions/side effects for all the Medicines you take and that have been prescribed to you. Take any new Medicines after you have completely understood and accpet all the possible adverse reactions/side effects.  ? ?Do not drive when taking Pain medications or sleeping medications (Benzodaizepines) ? ?Do not take more than prescribed Pain, Sleep and Anxiety Medications. It is not advisable to combine anxiety,sleep and pain medications without talking with your primary care practitioner ? ?Special Instructions: If you have smoked or chewed Tobacco  in the last 2 yrs please stop smoking, stop any regular Alcohol  and or any Recreational drug use. ? ?Wear Seat belts while driving. ? ?Please note: ?You were cared for by a hospitalist during your hospital stay. Once you are discharged, your primary care physician will handle any further medical issues. Please note that NO REFILLS for any discharge medications will be authorized once you are discharged, as it is imperative that you return to your primary care physician (or establish a relationship with a primary care physician if you do not have one) for your post hospital discharge needs so that they can reassess your need for medications and monitor your lab values.  ? Increase activity slowly   Complete by: As  directed ?  ? No wound care   Complete by: As directed ?  ? ?  ? ?Allergies as of 02/15/2022   ?No Known Allergies ?  ? ?  ?Medication List  ?  ? ?STOP taking these medications   ? ?acetaminophen 325 MG tablet ?Commonly known as: TYLENOL ?  ?bisacodyl 10 MG suppository ?Commonly known as: DULCOLAX ?  ?gabapentin 100 MG capsule ?Commonly known as: Neurontin ?  ? ?  ? ?TAKE these medications   ? ?carvedilol 6.25 MG tablet ?Commonly known as: COREG ?Take 1 tablet (6.25 mg total) by mouth 2 (two) times daily with a meal. ?  ?diltiazem 360 MG 24 hr capsule ?Commonly known as: TIAZAC ?Take 360 mg by mouth daily. ?  ?LIDOCAINE-PRILOCAINE EX ?Apply 1 application. topically Every Tuesday,Thursday,and Saturday with dialysis. ?  ?multivitamin Tabs tablet ?Take 1 tablet by mouth at bedtime. ?  ?oxyCODONE 5 MG immediate release tablet ?Commonly known as: Oxy IR/ROXICODONE ?Take 5 mg by mouth every 8 (eight) hours as needed for pain. ?  ?pantoprazole 40 MG tablet ?Commonly known as: PROTONIX ?Take 1 tablet (40 mg total) by mouth 2 (two) times daily. ?  ?sevelamer carbonate 800 MG tablet ?Commonly known as: RENVELA ?Take 800 mg by mouth 2 (two) times daily with a meal. ?  ? ?  ? ?  Follow-up Information   ? ? [name], MD. Schedule an appointment as soon as possible for a visit in 1 week(s).   ?Specialty: Internal Medicine ?Contact information: ?[address] ?[place] [address] ?[phone] ? ? ?  ?  ? ? Hemodialysis center Follow up.   ?Why: Follow at your usual schedule. ? ?  ?  ? ?  ?  ? ?  ? ?No Known Allergies ? ? ?Other Procedures/Studies: ?CT ABDOMEN PELVIS WO CONTRAST ? ?Result Date: 02/14/2022 ?CLINICAL DATA:  Acute generalized abdominal pain. EXAM: CT ABDOMEN AND PELVIS WITHOUT CONTRAST TECHNIQUE: Multidetector CT imaging of the abdomen and pelvis was performed following the standard protocol without IV contrast. RADIATION DOSE REDUCTION: This exam was performed according to the departmental  dose-optimization program which includes automated exposure control, adjustment of the mA and/or kV according to patient size and/or use of iterative reconstruction technique. COMPARISON:  November 20, 2021. November 12, 2021. Sept

## 2022-02-16 ENCOUNTER — Telehealth: Payer: Self-pay | Admitting: Nephrology

## 2022-02-16 NOTE — Telephone Encounter (Signed)
Transition of care contact from inpatient facility ? ?Date of Discharge: 02/15/22 ?Date of Contact: 02/16/22 ?Method of contact: Phone ? ?Attempted to contact patient to discuss transition of care from inpatient admission. Patient did not answer the phone and unable to leave voicemail. Mailbox full.  ? ?

## 2022-03-07 ENCOUNTER — Encounter
Payer: Medicare Other | Attending: Physical Medicine and Rehabilitation | Admitting: Physical Medicine and Rehabilitation

## 2022-03-13 ENCOUNTER — Emergency Department (HOSPITAL_COMMUNITY): Payer: Medicare Other

## 2022-03-13 ENCOUNTER — Inpatient Hospital Stay (HOSPITAL_COMMUNITY)
Admission: EM | Admit: 2022-03-13 | Discharge: 2022-03-15 | DRG: 640 | Disposition: A | Discharge status: Home / Self Care | Payer: Medicare Other | Attending: Family Medicine | Admitting: Family Medicine

## 2022-03-13 DIAGNOSIS — F1410 Cocaine abuse, uncomplicated: Secondary | ICD-10-CM | POA: Diagnosis present

## 2022-03-13 DIAGNOSIS — N186 End stage renal disease: Secondary | ICD-10-CM | POA: Diagnosis present

## 2022-03-13 DIAGNOSIS — E43 Unspecified severe protein-calorie malnutrition: Secondary | ICD-10-CM | POA: Diagnosis present

## 2022-03-13 DIAGNOSIS — Z91158 Patient's noncompliance with renal dialysis for other reason: Secondary | ICD-10-CM | POA: Diagnosis not present

## 2022-03-13 DIAGNOSIS — Z992 Dependence on renal dialysis: Secondary | ICD-10-CM | POA: Diagnosis not present

## 2022-03-13 DIAGNOSIS — I10 Essential (primary) hypertension: Secondary | ICD-10-CM | POA: Diagnosis not present

## 2022-03-13 DIAGNOSIS — Z79899 Other long term (current) drug therapy: Secondary | ICD-10-CM | POA: Diagnosis not present

## 2022-03-13 DIAGNOSIS — F1910 Other psychoactive substance abuse, uncomplicated: Secondary | ICD-10-CM | POA: Diagnosis not present

## 2022-03-13 DIAGNOSIS — F1210 Cannabis abuse, uncomplicated: Secondary | ICD-10-CM | POA: Diagnosis present

## 2022-03-13 DIAGNOSIS — J9601 Acute respiratory failure with hypoxia: Secondary | ICD-10-CM | POA: Diagnosis present

## 2022-03-13 DIAGNOSIS — I160 Hypertensive urgency: Secondary | ICD-10-CM | POA: Diagnosis present

## 2022-03-13 DIAGNOSIS — R0902 Hypoxemia: Secondary | ICD-10-CM | POA: Diagnosis not present

## 2022-03-13 DIAGNOSIS — B1920 Unspecified viral hepatitis C without hepatic coma: Secondary | ICD-10-CM | POA: Diagnosis present

## 2022-03-13 DIAGNOSIS — I480 Paroxysmal atrial fibrillation: Secondary | ICD-10-CM | POA: Diagnosis present

## 2022-03-13 DIAGNOSIS — Z20822 Contact with and (suspected) exposure to covid-19: Secondary | ICD-10-CM | POA: Diagnosis present

## 2022-03-13 DIAGNOSIS — F17210 Nicotine dependence, cigarettes, uncomplicated: Secondary | ICD-10-CM | POA: Diagnosis present

## 2022-03-13 DIAGNOSIS — Z833 Family history of diabetes mellitus: Secondary | ICD-10-CM

## 2022-03-13 DIAGNOSIS — E8770 Fluid overload, unspecified: Secondary | ICD-10-CM | POA: Diagnosis present

## 2022-03-13 DIAGNOSIS — Z8711 Personal history of peptic ulcer disease: Secondary | ICD-10-CM

## 2022-03-13 DIAGNOSIS — Z8249 Family history of ischemic heart disease and other diseases of the circulatory system: Secondary | ICD-10-CM

## 2022-03-13 DIAGNOSIS — I132 Hypertensive heart and chronic kidney disease with heart failure and with stage 5 chronic kidney disease, or end stage renal disease: Secondary | ICD-10-CM | POA: Diagnosis present

## 2022-03-13 DIAGNOSIS — I5043 Acute on chronic combined systolic (congestive) and diastolic (congestive) heart failure: Secondary | ICD-10-CM | POA: Diagnosis present

## 2022-03-13 DIAGNOSIS — Z6822 Body mass index (BMI) 22.0-22.9, adult: Secondary | ICD-10-CM | POA: Diagnosis not present

## 2022-03-13 DIAGNOSIS — E875 Hyperkalemia: Secondary | ICD-10-CM | POA: Diagnosis present

## 2022-03-13 DIAGNOSIS — R0603 Acute respiratory distress: Secondary | ICD-10-CM | POA: Diagnosis not present

## 2022-03-13 DIAGNOSIS — R778 Other specified abnormalities of plasma proteins: Secondary | ICD-10-CM | POA: Diagnosis present

## 2022-03-13 DIAGNOSIS — D631 Anemia in chronic kidney disease: Secondary | ICD-10-CM | POA: Diagnosis present

## 2022-03-13 DIAGNOSIS — Z85038 Personal history of other malignant neoplasm of large intestine: Secondary | ICD-10-CM | POA: Diagnosis not present

## 2022-03-13 HISTORY — DX: Unspecified viral hepatitis C without hepatic coma: B19.20

## 2022-03-13 LAB — COMPREHENSIVE METABOLIC PANEL
ALT: 21 U/L (ref 0–44)
AST: 35 U/L (ref 15–41)
Albumin: 3.1 g/dL — ABNORMAL LOW (ref 3.5–5.0)
Alkaline Phosphatase: 136 U/L — ABNORMAL HIGH (ref 38–126)
Anion gap: 19 — ABNORMAL HIGH (ref 5–15)
BUN: 36 mg/dL — ABNORMAL HIGH (ref 8–23)
CO2: 22 mmol/L (ref 22–32)
Calcium: 9 mg/dL (ref 8.9–10.3)
Chloride: 93 mmol/L — ABNORMAL LOW (ref 98–111)
Creatinine, Ser: 5.36 mg/dL — ABNORMAL HIGH (ref 0.61–1.24)
GFR, Estimated: 11 mL/min — ABNORMAL LOW (ref >60)
Glucose, Bld: 54 mg/dL — ABNORMAL LOW (ref 70–99)
Potassium: 4.1 mmol/L (ref 3.5–5.1)
Sodium: 134 mmol/L — ABNORMAL LOW (ref 135–145)
Total Bilirubin: 2.4 mg/dL — ABNORMAL HIGH (ref 0.3–1.2)
Total Protein: 8.5 g/dL — ABNORMAL HIGH (ref 6.5–8.1)

## 2022-03-13 LAB — CBC WITH DIFFERENTIAL/PLATELET
Abs Immature Granulocytes: 0.03 10*3/uL (ref 0.00–0.07)
Abs Immature Granulocytes: 0.04 10*3/uL (ref 0.00–0.07)
Basophils Absolute: 0 10*3/uL (ref 0.0–0.1)
Basophils Absolute: 0 10*3/uL (ref 0.0–0.1)
Basophils Relative: 1 %
Basophils Relative: 0 %
Eosinophils Absolute: 0 10*3/uL (ref 0.0–0.5)
Eosinophils Absolute: 0 10*3/uL (ref 0.0–0.5)
Eosinophils Relative: 1 %
Eosinophils Relative: 0 %
HCT: 36.3 % — ABNORMAL LOW (ref 39.0–52.0)
HCT: 38.2 % — ABNORMAL LOW (ref 39.0–52.0)
Hemoglobin: 11.5 g/dL — ABNORMAL LOW (ref 13.0–17.0)
Hemoglobin: 12 g/dL — ABNORMAL LOW (ref 13.0–17.0)
Immature Granulocytes: 1 %
Immature Granulocytes: 1 %
Lymphocytes Relative: 13 %
Lymphocytes Relative: 7 %
Lymphs Abs: 0.7 10*3/uL (ref 0.7–4.0)
Lymphs Abs: 0.5 10*3/uL — ABNORMAL LOW (ref 0.7–4.0)
MCH: 25.9 pg — ABNORMAL LOW (ref 26.0–34.0)
MCH: 25.8 pg — ABNORMAL LOW (ref 26.0–34.0)
MCHC: 31.7 g/dL (ref 30.0–36.0)
MCHC: 31.4 g/dL (ref 30.0–36.0)
MCV: 81.8 fL (ref 80.0–100.0)
MCV: 82.2 fL (ref 80.0–100.0)
Monocytes Absolute: 0.8 10*3/uL (ref 0.1–1.0)
Monocytes Absolute: 1 10*3/uL (ref 0.1–1.0)
Monocytes Relative: 16 %
Monocytes Relative: 14 %
Neutro Abs: 3.6 10*3/uL (ref 1.7–7.7)
Neutro Abs: 5.6 10*3/uL (ref 1.7–7.7)
Neutrophils Relative %: 68 %
Neutrophils Relative %: 78 %
Platelets: 169 10*3/uL (ref 150–400)
Platelets: 146 10*3/uL — ABNORMAL LOW (ref 150–400)
RBC: 4.44 MIL/uL (ref 4.22–5.81)
RBC: 4.65 MIL/uL (ref 4.22–5.81)
RDW: 20.8 % — ABNORMAL HIGH (ref 11.5–15.5)
RDW: 21.1 % — ABNORMAL HIGH (ref 11.5–15.5)
WBC: 5.2 10*3/uL (ref 4.0–10.5)
WBC: 7.1 10*3/uL (ref 4.0–10.5)
nRBC: 0 % (ref 0.0–0.2)
nRBC: 0 % (ref 0.0–0.2)

## 2022-03-13 LAB — I-STAT VENOUS BLOOD GAS, ED
Acid-Base Excess: 6 mmol/L — ABNORMAL HIGH (ref 0.0–2.0)
Bicarbonate: 29.5 mmol/L — ABNORMAL HIGH (ref 20.0–28.0)
Calcium, Ion: 0.91 mmol/L — ABNORMAL LOW (ref 1.15–1.40)
HCT: 42 % (ref 39.0–52.0)
Hemoglobin: 14.3 g/dL (ref 13.0–17.0)
O2 Saturation: 99 %
Potassium: 5.5 mmol/L — ABNORMAL HIGH (ref 3.5–5.1)
Sodium: 131 mmol/L — ABNORMAL LOW (ref 135–145)
TCO2: 31 mmol/L (ref 22–32)
pCO2, Ven: 37.9 mmHg — ABNORMAL LOW (ref 44–60)
pH, Ven: 7.5 — ABNORMAL HIGH (ref 7.25–7.43)
pO2, Ven: 132 mmHg — ABNORMAL HIGH (ref 32–45)

## 2022-03-13 LAB — BASIC METABOLIC PANEL
Anion gap: 16 — ABNORMAL HIGH (ref 5–15)
BUN: 44 mg/dL — ABNORMAL HIGH (ref 8–23)
CO2: 24 mmol/L (ref 22–32)
Calcium: 8.6 mg/dL — ABNORMAL LOW (ref 8.9–10.3)
Chloride: 93 mmol/L — ABNORMAL LOW (ref 98–111)
Creatinine, Ser: 6.31 mg/dL — ABNORMAL HIGH (ref 0.61–1.24)
GFR, Estimated: 9 mL/min — ABNORMAL LOW (ref >60)
Glucose, Bld: 70 mg/dL (ref 70–99)
Potassium: 5.6 mmol/L — ABNORMAL HIGH (ref 3.5–5.1)
Sodium: 133 mmol/L — ABNORMAL LOW (ref 135–145)

## 2022-03-13 LAB — PHOSPHORUS
Phosphorus: 5.6 mg/dL — ABNORMAL HIGH (ref 2.5–4.6)
Phosphorus: 5.5 mg/dL — ABNORMAL HIGH (ref 2.5–4.6)

## 2022-03-13 LAB — AMMONIA: Ammonia: 83 umol/L — ABNORMAL HIGH (ref 9–35)

## 2022-03-13 LAB — ALBUMIN: Albumin: 3.1 g/dL — ABNORMAL LOW (ref 3.5–5.0)

## 2022-03-13 LAB — I-STAT CHEM 8, ED
BUN: 62 mg/dL — ABNORMAL HIGH (ref 8–23)
Calcium, Ion: 0.89 mmol/L — CL (ref 1.15–1.40)
Chloride: 96 mmol/L — ABNORMAL LOW (ref 98–111)
Creatinine, Ser: 6.8 mg/dL — ABNORMAL HIGH (ref 0.61–1.24)
Glucose, Bld: 76 mg/dL (ref 70–99)
HCT: 42 % (ref 39.0–52.0)
Hemoglobin: 14.3 g/dL (ref 13.0–17.0)
Potassium: 5.5 mmol/L — ABNORMAL HIGH (ref 3.5–5.1)
Sodium: 131 mmol/L — ABNORMAL LOW (ref 135–145)
TCO2: 28 mmol/L (ref 22–32)

## 2022-03-13 LAB — HEPATITIS B SURFACE ANTIGEN: Hepatitis B Surface Ag: NONREACTIVE

## 2022-03-13 LAB — TROPONIN I (HIGH SENSITIVITY)
Troponin I (High Sensitivity): 344 ng/L (ref <18)
Troponin I (High Sensitivity): 344 ng/L (ref <18)

## 2022-03-13 LAB — MAGNESIUM: Magnesium: 2.7 mg/dL — ABNORMAL HIGH (ref 1.7–2.4)

## 2022-03-13 LAB — HEPATITIS B SURFACE ANTIBODY,QUALITATIVE: Hep B S Ab: REACTIVE — AB

## 2022-03-13 MED ORDER — HYDRALAZINE HCL 20 MG/ML IJ SOLN
10.0000 mg | Freq: Once | INTRAMUSCULAR | Status: AC
  Administered 2022-03-13: 10 mg via INTRAVENOUS
  Filled 2022-03-13: qty 1

## 2022-03-13 MED ORDER — SODIUM CHLORIDE 0.9 % IV SOLN: 100.0000 mL | INTRAVENOUS | Status: DC | PRN

## 2022-03-13 MED ORDER — ALBUTEROL SULFATE (2.5 MG/3ML) 0.083% IN NEBU
INHALATION_SOLUTION | RESPIRATORY_TRACT | Status: AC
  Filled 2022-03-13: qty 3

## 2022-03-13 MED ORDER — CHLORHEXIDINE GLUCONATE CLOTH 2 % EX PADS: 6.0000 | MEDICATED_PAD | Freq: Every day | CUTANEOUS | Status: DC

## 2022-03-13 MED ORDER — SODIUM CHLORIDE 0.9% FLUSH
3.0000 mL | Freq: Two times a day (BID) | INTRAVENOUS | Status: DC
Start: 2022-03-14 — End: 2022-03-15
  Administered 2022-03-14 – 2022-03-15 (×3): 3 mL via INTRAVENOUS

## 2022-03-13 MED ORDER — ASPIRIN 81 MG PO TBEC: 81.0000 mg | DELAYED_RELEASE_TABLET | Freq: Every day | ORAL | Status: DC

## 2022-03-13 MED ORDER — IOHEXOL 350 MG/ML SOLN
75.0000 mL | Freq: Once | INTRAVENOUS | Status: AC | PRN
  Administered 2022-03-13: 75 mL via INTRAVENOUS

## 2022-03-13 MED ORDER — DARBEPOETIN ALFA 200 MCG/0.4ML IJ SOSY
200.0000 ug | PREFILLED_SYRINGE | INTRAMUSCULAR | Status: DC
  Filled 2022-03-13: qty 0.4

## 2022-03-13 MED ORDER — ACETAMINOPHEN 650 MG RE SUPP: 650.0000 mg | Freq: Four times a day (QID) | RECTAL | Status: DC | PRN

## 2022-03-13 MED ORDER — DOXERCALCIFEROL 4 MCG/2ML IV SOLN
1.0000 ug | INTRAVENOUS | Status: DC
  Filled 2022-03-13: qty 2

## 2022-03-13 MED ORDER — SODIUM CHLORIDE 0.9 % IV SOLN: 250.0000 mL | INTRAVENOUS | Status: DC | PRN

## 2022-03-13 MED ORDER — ASPIRIN 300 MG RE SUPP
300.0000 mg | Freq: Once | RECTAL | Status: AC
  Administered 2022-03-14: 300 mg via RECTAL
  Filled 2022-03-13: qty 1

## 2022-03-13 MED ORDER — LIDOCAINE-PRILOCAINE 2.5-2.5 % EX CREA: 1.0000 "application " | TOPICAL_CREAM | CUTANEOUS | Status: DC | PRN

## 2022-03-13 MED ORDER — LIDOCAINE HCL (PF) 1 % IJ SOLN: 5.0000 mL | INTRAMUSCULAR | Status: DC | PRN

## 2022-03-13 MED ORDER — PENTAFLUOROPROP-TETRAFLUOROETH EX AERO: 1.0000 "application " | INHALATION_SPRAY | CUTANEOUS | Status: DC | PRN

## 2022-03-13 MED ORDER — SODIUM CHLORIDE 0.9% FLUSH: 3.0000 mL | INTRAVENOUS | Status: DC | PRN

## 2022-03-13 MED ORDER — ACETAMINOPHEN 325 MG PO TABS
650.0000 mg | ORAL_TABLET | Freq: Four times a day (QID) | ORAL | Status: DC | PRN
  Administered 2022-03-14 – 2022-03-15 (×2): 650 mg via ORAL
  Filled 2022-03-13 (×2): qty 2

## 2022-03-13 MED ORDER — CARVEDILOL 6.25 MG PO TABS
6.2500 mg | ORAL_TABLET | Freq: Two times a day (BID) | ORAL | Status: DC
  Administered 2022-03-14 – 2022-03-15 (×2): 6.25 mg via ORAL
  Filled 2022-03-13 (×2): qty 1

## 2022-03-13 MED ORDER — OXYCODONE HCL 5 MG PO TABS
5.0000 mg | ORAL_TABLET | Freq: Three times a day (TID) | ORAL | Status: DC | PRN
  Administered 2022-03-14 – 2022-03-15 (×2): 5 mg via ORAL
  Filled 2022-03-13 (×2): qty 1

## 2022-03-13 MED ORDER — ALBUTEROL SULFATE (2.5 MG/3ML) 0.083% IN NEBU: 2.5000 mg | INHALATION_SOLUTION | RESPIRATORY_TRACT | Status: DC | PRN

## 2022-03-13 MED ORDER — DILTIAZEM HCL ER COATED BEADS 240 MG PO CP24
360.0000 mg | ORAL_CAPSULE | Freq: Every day | ORAL | Status: DC
  Administered 2022-03-14 – 2022-03-15 (×2): 360 mg via ORAL
  Filled 2022-03-13 (×2): qty 1

## 2022-03-13 MED ORDER — CHLORHEXIDINE GLUCONATE CLOTH 2 % EX PADS
6.0000 | MEDICATED_PAD | Freq: Every day | CUTANEOUS | Status: DC
  Administered 2022-03-13 – 2022-03-15 (×3): 6 via TOPICAL

## 2022-03-13 MED ORDER — HEPARIN SODIUM (PORCINE) 1000 UNIT/ML DIALYSIS
1000.0000 [IU] | INTRAMUSCULAR | Status: DC | PRN
Start: 2022-03-13 — End: 2022-03-15

## 2022-03-13 MED ORDER — SEVELAMER CARBONATE 800 MG PO TABS
800.0000 mg | ORAL_TABLET | Freq: Two times a day (BID) | ORAL | Status: DC
  Administered 2022-03-14 – 2022-03-15 (×2): 800 mg via ORAL
  Filled 2022-03-13 (×2): qty 1

## 2022-03-13 MED ORDER — ALTEPLASE 2 MG IJ SOLR: 2.0000 mg | Freq: Once | INTRAMUSCULAR | Status: DC | PRN

## 2022-03-13 NOTE — ED Notes (Signed)
MD Doutova at bedside  

## 2022-03-13 NOTE — Procedures (Signed)
Pt seen in HD unit.  2 hrs into his HD he was still struggling to breath but at about 2.5 hrs he is finally settled down and sleeping w/o agitated breathing.  Will plan dc back to ED when done w/ HD.   I was present at this dialysis session, have reviewed the session itself and made  appropriate changes Kelly Splinter MD [place] pager [phone]   03/13/2022, 1:56 PM

## 2022-03-13 NOTE — Subjective & Objective (Signed)
Pt was supposed to get HD today reports SOB sice last night On NRB on arrival Emergent HD done by nephrology With improvement of SOB but still having O2 requirement Despite 3.7 L off Will still need another HD in AM Denies any CP

## 2022-03-13 NOTE — ED Triage Notes (Signed)
Pt came back to ER from in house dialysis. Pt on 6L [place] continues to reports SOB and CP. GCS 15. Per pt he is not on oxygen at home.

## 2022-03-13 NOTE — H&P (Signed)
Timothy [name] BPZ:[id number] DOB: 1960-11-29 DOA: 03/13/2022     PCP: [name], MD   Outpatient Specialists:     NEphrology: unsure who is the kidney doctor    Patient arrived to ER on 03/13/22 at 0651 Referred by Attending Gareth Morgan, MD   Patient coming from:    home Lives  With family    Chief Complaint:   Chief Complaint  Patient presents with   Shortness of Breath    HPI: Timothy [name] is a 61 y.o. male with medical history significant of ESRD on HD TH Sat, A.fib, HTN, drug abuse, chronic CHF, history of bleeding duodenal ulcer status post surgical repair, anemia of chronic disease, history of colon cancer      Presented with   SOB Pt was supposed to get HD today reports SOB sice last night On NRB on arrival Emergent HD done by nephrology With improvement of SOB but still having O2 requirement Despite 3.7 L off Will still need another HD in AM Denies any CP       Lab Results  Component Value Date   [place] 12/30/2021   [place] NEGATIVE 11/12/2021   [place] NEGATIVE 11/04/2021   [place] NEGATIVE 10/31/2021     Regarding pertinent Chronic problems:    Hyperlipidemia - not on statins Lipid Panel     Component Value Date/Time   CHOL 242 (H) 07/29/2015 0943   TRIG 196 (H) 11/06/2021 0359   HDL 51 07/29/2015 0943   CHOLHDL 4.7 07/29/2015 0943   VLDL 34 (H) 07/29/2015 0943   LDLCALC 157 (H) 07/29/2015 0943     HTN on  Coreg diltiazem   chronic CHF diastolic/systolic/ combined - last echo 02/14/2022 EF 50 to 55% Left ventricular diastolic parameters are  consistent with Grade II diastolic dysfunction   A. Fib -  - CHA2DS2 vas score   2   Not on anticoagulation secondary to R recurrent bleeding         -  Rate control:  Currently controlled with  Diltiazem, Coreg     ESRD on HD t H s  Lab Results  Component Value Date   CREATININE 6.80 (H) 03/13/2022   CREATININE 6.31 (H) 03/13/2022   CREATININE 6.55  (H) 02/15/2022    Chronic anemia - baseline hg Hemoglobin & Hematocrit  Recent Labs    03/13/22 0805 03/13/22 0827 03/13/22 0914  HGB 14.3 14.3 11.5*     While in ER:   Needing to go back on BIpAP now more comfortable    CXR - 1. Stable chest x-ray without evidence of acute cardiopulmonary process. 2. Stable chronic elevation of the right hemidiaphragm with small effusion and atelectasis/scarring in the right middle and lower lobes. 3. Stable to slightly improved cardiomegaly and mild vascular congestion.    CTA chest - No evidence of pulmonary embolism.   Cardiomegaly with mild interstitial edema and small right pleural effusion.   Upper abdominal ascites.  Following Medications were ordered in ER: Medications  albuterol (PROVENTIL) (2.5 MG/3ML) 0.083% nebulizer solution (  Not Given 03/13/22 0744)  Chlorhexidine Gluconate Cloth 2 % PADS 6 each (6 each Topical Not Given 03/13/22 0910)  Darbepoetin Alfa (ARANESP) injection 200 mcg (has no administration in time range)  doxercalciferol (HECTOROL) injection 1 mcg (has no administration in time range)  Chlorhexidine Gluconate Cloth 2 % PADS 6 each (has no administration in time range)  pentafluoroprop-tetrafluoroeth (GEBAUERS) aerosol 1 application. (has no administration in time range)  lidocaine (PF) (  XYLOCAINE) 1 % injection 5 mL (has no administration in time range)  lidocaine-prilocaine (EMLA) cream 1 application. (has no administration in time range)  0.9 %  sodium chloride infusion (has no administration in time range)  0.9 %  sodium chloride infusion (has no administration in time range)  heparin injection 1,000 Units (has no administration in time range)  alteplase (CATHFLO ACTIVASE) injection 2 mg (has no administration in time range)  hydrALAZINE (APRESOLINE) injection 10 mg (10 mg Intravenous Given 03/13/22 0828)  iohexol (OMNIPAQUE) 350 MG/ML injection 75 mL (75 mLs Intravenous Contrast Given 03/13/22 1740)   hydrALAZINE (APRESOLINE) injection 10 mg (10 mg Intravenous Given 03/13/22 2000)    _______________________________________________________ ER Provider Called:   Nephrology   Dr. Hollie Salk They Recommend admit to medicine    SEEN in ER   ED Triage Vitals  Enc Vitals Group     BP 03/13/22 0658 (!) 166/127     Pulse Rate 03/13/22 0658 94     Resp 03/13/22 0658 (!) 33     Temp 03/13/22 0658 97.8 F (36.6 C)     Temp Source 03/13/22 0658 Oral     SpO2 03/13/22 0658 100 %     Weight --      Height --      Head Circumference --      Peak Flow --      Pain Score 03/13/22 0656 0     Pain Loc --      Pain Edu? --      Excl. in [place]? --   TMAX(24)@     _________________________________________ Significant initial  Findings: Abnormal Labs Reviewed  BASIC METABOLIC PANEL - Abnormal; Notable for the following components:      Result Value   Sodium 133 (*)    Potassium 5.6 (*)    Chloride 93 (*)    BUN 44 (*)    Creatinine, Ser 6.31 (*)    Calcium 8.6 (*)    GFR, Estimated 9 (*)    Anion gap 16 (*)    All other components within normal limits  MAGNESIUM - Abnormal; Notable for the following components:   Magnesium 2.7 (*)    All other components within normal limits  PHOSPHORUS - Abnormal; Notable for the following components:   Phosphorus 5.6 (*)    All other components within normal limits  CBC WITH DIFFERENTIAL/PLATELET - Abnormal; Notable for the following components:   Hemoglobin 11.5 (*)    HCT 36.3 (*)    MCH 25.9 (*)    RDW 20.8 (*)    All other components within normal limits  I-STAT CHEM 8, ED - Abnormal; Notable for the following components:   Sodium 131 (*)    Potassium 5.5 (*)    Chloride 96 (*)    BUN 62 (*)    Creatinine, Ser 6.80 (*)    Calcium, Ion 0.89 (*)    All other components within normal limits  I-STAT VENOUS BLOOD GAS, ED - Abnormal; Notable for the following components:   pH, Ven 7.500 (*)    pCO2, Ven 37.9 (*)    pO2, Ven 132 (*)    Bicarbonate  29.5 (*)    Acid-Base Excess 6.0 (*)    Sodium 131 (*)    Potassium 5.5 (*)    Calcium, Ion 0.91 (*)    All other components within normal limits       ECG: Ordered Personally reviewed by me showing: HR : 92 Rhythm:Sinus rhythm Borderline  low voltage, extremity leads Nonspecific repol abnormality, diffuse leads No acute changes QTC 432    The recent clinical data is shown below. Vitals:   03/13/22 1945 03/13/22 2000 03/13/22 2015 03/13/22 2030  BP:  (!) 177/131  (!) 180/115  Pulse: 97 98 99   Resp: (!) 26 (!) 27 (!) 27 (!) 28  Temp:      TempSrc:      SpO2: 96% 96% 97%     WBC     Component Value Date/Time   WBC 5.2 03/13/2022 0914   LYMPHSABS 0.7 03/13/2022 0914   MONOABS 0.8 03/13/2022 0914   EOSABS 0.0 03/13/2022 0914   BASOSABS 0.0 03/13/2022 0914      _______________________________________________ Hospitalist was called for admission for   Acute respiratory distress  Hypervolemia, unspecified hypervolemia type    Acute hyperkalemia      The following Work up has been ordered so far:  Orders Placed This Encounter  Procedures   Critical Care   DG Chest Port 1 View   CT Angio Chest PE W and/or Wo Contrast   Basic metabolic panel   CBC with Differential   Magnesium   Phosphorus   Blood gas, venous (at [facility] and [facility], not at [facility])   Albumin   CBC with Differential/Platelet   Comprehensive metabolic panel   CBC with Differential   Renal function panel   CBC   Hepatitis B surface antigen   Hepatitis B surface antibody   Hepatitis B surface antibody,quantitative   Informed Consent Details: Physician/Practitioner Attestation; Transcribe to consent form and obtain patient signature   Pre-Hemodialysis Protocol - Day of Dialysis   Post-Dialysis Protocol - Day of Dialysis   Pre-Hemodialysis Protocol - Day of Dialysis   Post-Dialysis Protocol - Day of Dialysis   Hemodialysis Treatment Protocol   Change HD cath dressing   Informed Consent Details:  Physician/Practitioner Attestation; Transcribe to consent form and obtain patient signature   no heparin   Consult to nephrology   Consult to nephrology   Consult to hospitalist   Bipap   I-stat chem 8, ED (not at [facility] or [facility])   I-Stat venous blood gas, ED   EKG 12-Lead   Hemodialysis ED   Hemodialysis inpatient     OTHER Significant initial  Findings:  labs showing:    Recent Labs  Lab 03/13/22 0800 03/13/22 0805 03/13/22 0827  NA 133* 131* 131*  K 5.6* 5.5* 5.5*  CO2 24  --   --   GLUCOSE 70 76  --   BUN 44* 62*  --   CREATININE 6.31* 6.80*  --   CALCIUM 8.6*  --   --   MG 2.7*  --   --   PHOS 5.6*  --   --     Cr  stable,   Lab Results  Component Value Date   CREATININE 6.80 (H) 03/13/2022   CREATININE 6.31 (H) 03/13/2022   CREATININE 6.55 (H) 02/15/2022    No results for input(s): AST, ALT, ALKPHOS, BILITOT, PROT, ALBUMIN in the last 168 hours. Lab Results  Component Value Date   CALCIUM 8.6 (L) 03/13/2022   PHOS 5.6 (H) 03/13/2022          Plt: Lab Results  Component Value Date   PLT 169 03/13/2022     Venous  Blood Gas repeat ordered  ABG    Component Value Date/Time   PHART 7.394 11/05/2021 1043   PCO2ART 44.4 11/05/2021 1043   PO2ART 82 (  L) 11/05/2021 1043   HCO3 29.5 (H) 03/13/2022 0827   TCO2 31 03/13/2022 0827   ACIDBASEDEF 4.0 (H) 09/09/2016 0931   O2SAT 99 03/13/2022 0827        Recent Labs  Lab 03/13/22 0805 03/13/22 0827 03/13/22 0914  WBC  --   --  5.2  NEUTROABS  --   --  3.6  HGB 14.3 14.3 11.5*  HCT 42.0 42.0 36.3*  MCV  --   --  81.8  PLT  --   --  169    HG/HCT   stable,      Component Value Date/Time   HGB 11.5 (L) 03/13/2022 0914   HCT 36.3 (L) 03/13/2022 0914   MCV 81.8 03/13/2022 0914       .car BNP (last 3 results) Recent Labs    11/01/21 0303 12/30/21 1110 02/13/22 1646  BNP 1,194.0* >4,500.0* >4,500.0*      Cultures:    Component Value Date/Time   SDES PERITONEAL FLUID 06/22/2021 1236    SPECREQUEST ABDOMEN 06/22/2021 1236   CULT  06/22/2021 1236    NO GROWTH 3 DAYS Performed at [facility], [place] [address] [place], [place] [address]    REPTSTATUS 06/26/2021 FINAL 06/22/2021 1236     Radiological Exams on Admission: CT Angio Chest PE W and/or Wo Contrast  Result Date: 03/13/2022 CLINICAL DATA:  Shortness of breath, chest pain, on dialysis EXAM: CT ANGIOGRAPHY CHEST WITH CONTRAST TECHNIQUE: Multidetector CT imaging of the chest was performed using the standard protocol during bolus administration of intravenous contrast. Multiplanar CT image reconstructions and MIPs were obtained to evaluate the vascular anatomy. RADIATION DOSE REDUCTION: This exam was performed according to the departmental dose-optimization program which includes automated exposure control, adjustment of the mA and/or kV according to patient size and/or use of iterative reconstruction technique. CONTRAST:  48m OMNIPAQUE IOHEXOL 350 MG/ML SOLN COMPARISON:  Chest radiograph dated 03/13/2022. CTA chest dated 10/05/2021. FINDINGS: Cardiovascular: Satisfactory opacification of the bilateral pulmonary arteries to the segmental level. No evidence of pulmonary embolism. Study is not tailored for evaluation of the thoracic aorta. No evidence of thoracic aortic aneurysm. Atherosclerotic calcifications of the aortic arch. Mild cardiomegaly.  No pericardial effusion. Three vessel coronary atherosclerosis. Mediastinum/Nodes: Small mediastinal lymph nodes which do not meet pathologic CT size criteria. Visualized thyroid is unremarkable. Lungs/Pleura: Scattered areas of stable linear/patchy scarring in the lungs bilaterally, right lower lobe predominant. Mild lingular atelectasis. Faint ground-glass opacity/mosaic attenuation in the lungs bilaterally, similar to the prior, favoring mild interstitial edema. No focal consolidation. No suspicious pulmonary nodules. Small right pleural effusion, including along the right major  fissure. No pneumothorax. Upper Abdomen: Visualized upper abdomen is notable for upper abdominal ascites. Loop of bowel beneath the anterior abdominal wall (series 5/image 167) when correlating with prior CT abdomen/pelvis. Musculoskeletal: Visualized osseous structures are within normal limits. Review of the MIP images confirms the above findings. IMPRESSION: No evidence of pulmonary embolism. Cardiomegaly with mild interstitial edema and small right pleural effusion. Upper abdominal ascites. Aortic Atherosclerosis (ICD10-I70.0). Electronically Signed   By: SJulian HyM.D.   On: 03/13/2022 18:05   DG Chest Port 1 View  Result Date: 03/13/2022 CLINICAL DATA:  Short of breath, end-stage renal disease EXAM: PORTABLE CHEST 1 VIEW COMPARISON:  Prior chest x-ray 02/13/2022 FINDINGS: Stable cardiomegaly. Mediastinal contours are unchanged. Mild vascular congestion, slightly less than seen on prior chest x-ray. Persistent chronic atelectasis versus scarring in the right middle and lower lobes with elevation of  the right hemidiaphragm. No pneumothorax. Small chronic right pleural effusion remains stable. No acute osseous abnormality. IMPRESSION: 1. Stable chest x-ray without evidence of acute cardiopulmonary process. 2. Stable chronic elevation of the right hemidiaphragm with small effusion and atelectasis/scarring in the right middle and lower lobes. 3. Stable to slightly improved cardiomegaly and mild vascular congestion. Electronically Signed   By: Jacqulynn Cadet M.D.   On: 03/13/2022 07:22   _______________________________________________________________________________________________________ Latest  Blood pressure (!) 180/115, pulse 99, temperature 98.1 F (36.7 C), temperature source Oral, resp. rate (!) 28, SpO2 97 %.   Vitals  labs and radiology finding personally reviewed  Review of Systems:    Pertinent positives include:  fatigue shortness of breath at rest. dyspnea on  exertion, Constitutional:  No weight loss, night sweats, Fevers, chills, , weight loss  HEENT:  No headaches, Difficulty swallowing,Tooth/dental problems,Sore throat,  No sneezing, itching, ear ache, nasal congestion, post nasal drip,  Cardio-vascular:  No chest pain, Orthopnea, PND, anasarca, dizziness, palpitations.no Bilateral lower extremity swelling  GI:  No heartburn, indigestion, abdominal pain, nausea, vomiting, diarrhea, change in bowel habits, loss of appetite, melena, blood in stool, hematemesis Resp:   No excess mucus, no productive cough, No non-productive cough, No coughing up of blood.No change in color of mucus.No wheezing. Skin:  no rash or lesions. No jaundice GU:  no dysuria, change in color of urine, no urgency or frequency. No straining to urinate.  No flank pain.  Musculoskeletal:  No joint pain or no joint swelling. No decreased range of motion. No back pain.  Psych:  No change in mood or affect. No depression or anxiety. No memory loss.  Neuro: no localizing neurological complaints, no tingling, no weakness, no double vision, no gait abnormality, no slurred speech, no confusion  All systems reviewed and apart from [place] all are negative _______________________________________________________________________________________________ Past Medical History:   Past Medical History:  Diagnosis Date   Anemia of chronic kidney failure    BPH (benign prostatic hyperplasia)    Colon cancer ([place]) 2014   spouse states he had surgury for colon CA   End stage renal disease on dialysis ([place]) 2017   Hypertension    Hypertensive heart disease with chronic diastolic congestive heart failure ([place]) 07/17/2016   Polysubstance abuse ([place])    History of heroin and marijuana use      Past Surgical History:  Procedure Laterality Date   A/V FISTULAGRAM Left 12/02/2021   Procedure: A/V Fistulagram;  Surgeon: Cherre Robins, MD;  Location: [place] [facility];  Service:  Cardiovascular;  Laterality: Left;   ABDOMINAL SURGERY     AV FISTULA PLACEMENT Left 09/19/2016   Procedure: Left arm Radiocephalic ARTERIOVENOUS (AV) FISTULA CREATION;  Surgeon: Conrad Pratt, MD;  Location: [facility];  Service: Vascular;  Laterality: Left;   [place] Left 07/09/2017   Procedure: BRACHIOCEPHALIC FISTULA CREATION;  Surgeon: Conrad [place], MD;  Location: [facility];  Service: Vascular;  Laterality: Left;   COLON SURGERY  2014   ESOPHAGOGASTRODUODENOSCOPY (EGD) WITH PROPOFOL N/A 11/05/2021   Procedure: ESOPHAGOGASTRODUODENOSCOPY (EGD) WITH PROPOFOL;  Surgeon: Carol Ada, MD;  Location: [facility];  Service: Endoscopy;  Laterality: N/A;   ESOPHAGOGASTRODUODENOSCOPY (EGD) WITH PROPOFOL N/A 11/14/2021   Procedure: ESOPHAGOGASTRODUODENOSCOPY (EGD) WITH PROPOFOL;  Surgeon: [name], MD;  Location: [facility];  Service: Gastroenterology;  Laterality: N/A;   ESOPHAGOGASTRODUODENOSCOPY (EGD) WITH PROPOFOL N/A 11/16/2021   Procedure: ESOPHAGOGASTRODUODENOSCOPY (EGD) WITH PROPOFOL;  Surgeon: [name], MD;  Location:  [facility];  Service: Gastroenterology;  Laterality: N/A;   HEMOSTASIS CLIP PLACEMENT  11/16/2021   Procedure: HEMOSTASIS CLIP PLACEMENT;  Surgeon: [name], MD;  Location: [facility];  Service: Gastroenterology;;   HEMOSTASIS CONTROL  11/05/2021   Procedure: HEMOSTASIS CONTROL;  Surgeon: Carol Ada, MD;  Location: [place];  Service: Endoscopy;;   HEMOSTASIS CONTROL  11/16/2021   Procedure: HEMOSTASIS CONTROL;  Surgeon: [name], MD;  Location: [facility] [place];  Service: Gastroenterology;;   HOT HEMOSTASIS N/A 11/16/2021   Procedure: HOT HEMOSTASIS (ARGON PLASMA COAGULATION/BICAP);  Surgeon: [name], MD;  Location: [facility];  Service: Gastroenterology;  Laterality: N/A;   INSERTION OF DIALYSIS CATHETER N/A 09/19/2016   Procedure: INSERTION OF TUNNELED DIALYSIS CATHETER;  Surgeon: Conrad [place], MD;  Location: [facility];  Service:  Vascular;  Laterality: N/A;   IR ANGIOGRAM SELECTIVE EACH ADDITIONAL VESSEL  11/16/2021   IR ANGIOGRAM VISCERAL SELECTIVE  11/05/2021   IR ANGIOGRAM VISCERAL SELECTIVE  11/16/2021   IR ANGIOGRAM VISCERAL SELECTIVE  11/16/2021   IR EMBO ART  VEN HEMORR LYMPH EXTRAV  INC GUIDE ROADMAPPING  11/05/2021   IR EMBO ART  VEN HEMORR LYMPH EXTRAV  INC GUIDE ROADMAPPING  11/16/2021   IR GENERIC HISTORICAL  09/14/2016   IR US GUIDE VASC ACCESS RIGHT 09/14/2016 [name], DO [facility]   IR GENERIC HISTORICAL  09/14/2016   IR FLUORO GUIDE CV LINE RIGHT 09/14/2016 [name], DO [facility]   IR PARACENTESIS  06/22/2021   IR US GUIDE [place] RIGHT  11/05/2021   IR US GUIDE [place] RIGHT  11/16/2021   LAPAROTOMY N/A 05/23/2021   Procedure: EXPLORATORY LAPAROTOMY LYSIS ADHESIONS;  Surgeon: [name], MD;  Location: [facility];  Service: General;  Laterality: N/A;   LAPAROTOMY N/A 11/20/2021   Procedure: EXPLORATORY LAPAROTOMY, REPAIR OF BLEEDING DUODENAL ULCER;  Surgeon: Dwan Bolt, MD;  Location: [facility];  Service: General;  Laterality: N/A;   ORIF TIBIA PLATEAU Left 01/15/2018   Procedure: OPEN REDUCTION INTERNAL FIXATION (ORIF) TIBIAL PLATEAU;  Surgeon: Altamese Enfield, MD;  Location: [facility];  Service: Orthopedics;  Laterality: Left;   SCLEROTHERAPY  11/05/2021   Procedure: Clide Deutscher;  Surgeon: Carol Ada, MD;  Location: [facility];  Service: Endoscopy;;   SCLEROTHERAPY  11/16/2021   Procedure: Clide Deutscher;  Surgeon: [name], MD;  Location[facility];  Service: Gastroenterology;;   TRANSTHORACIC ECHOCARDIOGRAM  07/2016    EF 60-65%, No RWMA. Mod Concentric LVH - Gr 2 DD. Severe LA dilation. PAP ~35 mmHg (mild Pulm HTN)  --> no changes noted 1 month later    Social History:  Ambulatory   independently       reports that he has been smoking cigarettes. He has been smoking an average of .25 packs per day. He has never used smokeless tobacco. He reports current drug use.  Frequency: 1.00 time per week. Drugs: Marijuana and Heroin. He reports that he does not drink alcohol.    Family History:   Family History  Problem Relation Age of Onset   Heart failure Mother        Died at age 30.   Heart attack Mother 90   Hypertension Mother    Diabetes Mellitus II Mother    Other Father        Unknown   Kidney failure Sister        (Oldest Sister)   Other Other        Multiple siblings have  started her heart disease, he is not sure of the details.   CAD Nephew    ______________________________________________________________________________________________ Allergies: No Known Allergies   Prior to Admission medications   Medication Sig Start Date End Date Taking? Authorizing Provider  diltiazem (TIAZAC) 360 MG 24 hr capsule Take 360 mg by mouth daily.   Yes [provider]  albuterol (VENTOLIN HFA) 108 (90 Base) MCG/ACT inhaler Inhale 2 puffs into the lungs every 6 (six) hours as needed. 01/06/22   [provider]  carvedilol (COREG) 6.25 MG tablet Take 1 tablet (6.25 mg total) by mouth 2 (two) times daily with a meal. 01/01/22   Amponsah, Charisse March, MD  DULoxetine (CYMBALTA) 20 MG capsule Take 20 mg by mouth daily. 01/06/22   [provider]  LIDOCAINE-PRILOCAINE EX Apply 1 application. topically Every Tuesday,Thursday,and Saturday with dialysis.    [provider]  multivitamin (RENA-VIT) TABS tablet Take 1 tablet by mouth at bedtime. 12/02/21   Mercy Riding, MD  oxyCODONE (OXY IR/ROXICODONE) 5 MG immediate release tablet Take 5 mg by mouth every 8 (eight) hours as needed for pain. 12/09/21   [provider]  pantoprazole (PROTONIX) 40 MG tablet Take 1 tablet (40 mg total) by mouth 2 (two) times daily. Patient not taking: Reported on 02/14/2022 12/02/21   Mercy Riding, MD  sevelamer carbonate (RENVELA) 800 MG tablet Take 800 mg by mouth 2 (two) times daily with a meal. 11/14/21   [provider]     ___________________________________________________________________________________________________ Physical Exam:    03/13/2022    8:30 PM 03/13/2022    8:15 PM 03/13/2022    8:00 PM  Vitals with BMI  Systolic 196  222  Diastolic 979  892  Pulse  99 98     1. General:  in  increased work of breathing   On BIPAP  Chronically ill  -appearing 2. Psychological: Alert and   Oriented 3. Head/ENT:    Dry Mucous Membranes                          Head Non traumatic, neck supple                            Poor Dentition 4. SKIN: normal   Skin turgor,  Skin clean Dry and intact no rash 5. Heart: Regular rate and rhythm no  Murmur, no Rub or gallop 6. Lungs:  , no wheezes some crackles   7. Abdomen: Soft,  non-tender, Non distended  bowel sounds present 8. Lower extremities: no clubbing, cyanosis, no  edema 9. Neurologically Grossly intact, moving all 4 extremities equally   10. MSK: Normal range of motion    Chart has been reviewed  ______________________________________________________________________________________________  Assessment/Plan  61 y.o. male with medical history significant of ESRD on HD TH Sat, A.fib, HTN, drug abuse, chronic CHF, history of bleeding duodenal ulcer status post surgical repair, anemia of chronic disease, history of colon cancer     Admitted for   Acute respiratory distress Hypervolemia, unspecified hypervolemia type Acute hyperkalemia     Present on Admission:  Acute respiratory failure with hypoxia ([place])  Acute on chronic combined systolic and diastolic CHF (congestive heart failure) (HCC)  HTN (hypertension)  Anemia due to chronic kidney disease  Substance abuse ([place])  Protein-calorie malnutrition, severe  Paroxysmal A-fib (H[facility])  Hyperkalemia     Acute on chronic combined systolic and diastolic CHF (  congestive heart failure) ([place]) For in the setting of needing hemodialysis fluid overload.  Nephrology is aware plan to hemodialyzed in AM  again Continue home medications once stable    Acute respiratory failure with hypoxia (H[facility]) On BIPAP increased work of breathing order VBG Respiratory reassess pt seems to be breathing over the bipap Pulling small volumes    this patient has acute respiratory failure with Hypoxia  as documented by the presence of following: O2 saturatio< 90% on RA  Likely due to: fluid overload Provide O2 therapy and titrate as needed  Continuous pulse ox   check Pulse ox with ambulation prior to discharge    HTN (hypertension) restart home meds when able  ESRD on HD TTS Plan for repeat HD in AM nephrology is aware  Anemia due to chronic kidney disease Chronic stable continue to monitor  Substance abuse [facility]) Spoke about importance of avoiding drug abuse.  Protein-calorie malnutrition, severe nutritional consult in AM  Paroxysmal A-fib [facility]) Not on anticoagulation.  Continue Coreg and diltiazem when able to restart    Hyperkalemia Pt has been dialyzed recently will recheck labs, treat as needed   Other plan as per orders.  DVT prophylaxis:  SCD        Code Status:    Code Status: Prior FULL CODE   as per patient  , last time he wated to be DNR now wished to be full code I had personally discussed CODE STATUS with patient      Family Communication:   Family not at  Bedside     Disposition Plan:           To home once workup is complete and patient is stable   Following barriers for discharge:                            Electrolytes corrected                                                        Will need consultants to evaluate patient prior to discharge                      Transition of care consulted                   Nutrition    consulted                                     Palliative care    consulted                                       Consults called:  nephrology is aware  Admission status:  ED Disposition     ED Disposition  [place]: [place] [[id number]]  Level of Care: Progressive [102]  Admit to Progressive based on following criteria: RESPIRATORY PROBLEMS hypoxemic/hypercapnic respiratory failure that is responsive to NIPPV (BiPAP) or High Flow Nasal Cannula (6-80 lpm). Frequent assessment/intervention, no > Q2 hrs < Q4 hrs, to maintain  oxygenation and pulmonary hygiene.  May admit patient to [facility] or [facility] if equivalent level of care is available:: No  Covid Evaluation: Asymptomatic - no recent exposure (last 10 days) testing not required  Diagnosis: Acute respiratory failure with hypoxia [facility]) [[id number]]  Admitting Physician: Toy Baker [[id number]]  Attending Physician: Toy Baker [[id number]]  Estimated length of stay: past midnight tomorrow  Certification:: I certify this patient will need inpatient services for at least 2 midnights           inpatient     I Expect 2 midnight stay secondary to severity of patient's current illness need for inpatient interventions justified by the following:  hemodynamic instability despite optimal treatment (tachycardia  tachypnea  hypoxia,  )   Severe lab/radiological/exam abnormalities including:    hyperkalemia and extensive comorbidities including:  substance abuse  ESRD  That are currently affecting medical management.   I expect  patient to be hospitalized for 2 midnights requiring inpatient medical care.  Patient is at high risk for adverse outcome (such as loss of life or disability) if not treated.  Indication for inpatient stay as follows:    New or worsening hypoxia   Need for need for biPAP    Level of care       progressive tele indefinitely please discontinue once patient no longer qualifies COVID-19 Labs    Cutter Passey 03/13/2022, 9:33 PM    [facility]     after 2 AM please page floor coverage PA If 7AM-7PM, please contact the day team taking care of the patient  using [email or web address]   Patient was evaluated in the context of the global COVID-19 pandemic, which necessitated consideration that the patient might be at risk for infection with the SARS-CoV-2 virus that causes COVID-19. Institutional protocols and algorithms that pertain to the evaluation of patients at risk for COVID-19 are in a state of rapid change based on information released by regulatory bodies including the CDC and federal and state organizations. These policies and algorithms were followed during the patient's care.

## 2022-03-13 NOTE — Assessment & Plan Note (Signed)
Pt has been dialyzed recently will recheck labs, treat as needed

## 2022-03-13 NOTE — ED Notes (Signed)
RN took pt off bi-pap per MD orders

## 2022-03-13 NOTE — Progress Notes (Signed)
RT and RN transported BIPAP pt from ED to [place] Rm 11. Vital signs stable throughout. Report called to unit RT.

## 2022-03-13 NOTE — Assessment & Plan Note (Signed)
Spoke about importance of avoiding drug abuse.

## 2022-03-13 NOTE — Assessment & Plan Note (Addendum)
On BIPAP increased work of breathing order VBG Respiratory reassess pt seems to be breathing over the bipap Pulling small volumes    this patient has acute respiratory failure with Hypoxia  as documented by the presence of following: O2 saturatio< 90% on RA  Likely due to: fluid overload Provide O2 therapy and titrate as needed  Continuous pulse ox   check Pulse ox with ambulation prior to discharge

## 2022-03-13 NOTE — Assessment & Plan Note (Signed)
For in the setting of needing hemodialysis fluid overload.  Nephrology is aware plan to hemodialyzed in AM again Continue home medications once stable

## 2022-03-13 NOTE — Consult Note (Signed)
Renal Service Consult Note [facility]  Timothy [name] 03/13/2022 Timothy Blazing, MD Requesting Physician: Dr. Kathrynn Humble  Reason for Consult: ESRD pt w/ resp distress HPI: The patient is a 61 y.o. year-old w/ hx of anemia, colon cancer, ESRD on HD, HTN, hx substance abuse presented to ED this am w/ SOB, onset last night about 10 pm.  EMS used NRG and in ED pt was placed on bipap. Initial RR 34, down to 19 now. Pt says breathing is "a little better". Asked to see for dialysis.   Pt unable to provide much hx of bipap in place. Denies any CP.    ROS - denies CP, no joint pain, no HA, no blurry vision, no rash, no diarrhea, no nausea/ vomiting, no dysuria, no difficulty voiding   Past Medical History  Past Medical History:  Diagnosis Date   Anemia of chronic kidney failure    BPH (benign prostatic hyperplasia)    Colon cancer ([place]) 2014   spouse states he had surgury for colon CA   End stage renal disease on dialysis ([place]) 2017   Hypertension    Hypertensive heart disease with chronic diastolic congestive heart failure ([place]) 07/17/2016   Polysubstance abuse ([place])    History of heroin and marijuana use   Past Surgical History  Past Surgical History:  Procedure Laterality Date   A/V FISTULAGRAM Left 12/02/2021   Procedure: A/V Fistulagram;  Surgeon: Cherre Robins, MD;  Location: [place] [facility];  Service: Cardiovascular;  Laterality: Left;   ABDOMINAL SURGERY     AV FISTULA PLACEMENT Left 09/19/2016   Procedure: Left arm Radiocephalic ARTERIOVENOUS (AV) FISTULA CREATION;  Surgeon: Conrad [place]ville, MD;  Location: [place];  Service: Vascular;  Laterality: Left;   [place] Left 07/09/2017   Procedure: BRACHIOCEPHALIC FISTULA CREATION;  Surgeon: Conrad [place], MD;  Location: [place];  Service: Vascular;  Laterality: Left;   COLON SURGERY  2014   ESOPHAGOGASTRODUODENOSCOPY (EGD) WITH PROPOFOL N/A 11/05/2021   Procedure: ESOPHAGOGASTRODUODENOSCOPY (EGD)  WITH PROPOFOL;  Surgeon: Carol Ada, MD;  Location: [place];  Service: Endoscopy;  Laterality: N/A;   ESOPHAGOGASTRODUODENOSCOPY (EGD) WITH PROPOFOL N/A 11/14/2021   Procedure: ESOPHAGOGASTRODUODENOSCOPY (EGD) WITH PROPOFOL;  Surgeon: [name], MD;  Location: [place];  Service: Gastroenterology;  Laterality: N/A;   ESOPHAGOGASTRODUODENOSCOPY (EGD) WITH PROPOFOL N/A 11/16/2021   Procedure: ESOPHAGOGASTRODUODENOSCOPY (EGD) WITH PROPOFOL;  Surgeon: [name], MD;  Location: [place];  Service: Gastroenterology;  Laterality: N/A;   HEMOSTASIS CLIP PLACEMENT  11/16/2021   Procedure: HEMOSTASIS CLIP PLACEMENT;  Surgeon: [name], MD;  Location: [facility] ENDOSCOPY;  Service: Gastroenterology;;   HEMOSTASIS CONTROL  11/05/2021   Procedure: HEMOSTASIS CONTROL;  Surgeon: Carol Ada, MD;  Location: [place];  Service: Endoscopy;;   HEMOSTASIS CONTROL  11/16/2021   Procedure: HEMOSTASIS CONTROL;  Surgeon: [name], MD;  Location: [place];  Service: Gastroenterology;;   HOT HEMOSTASIS N/A 11/16/2021   Procedure: HOT HEMOSTASIS (ARGON PLASMA COAGULATION/BICAP);  Surgeon: [name], MD;  Location: [place];  Service: Gastroenterology;  Laterality: N/A;   INSERTION OF DIALYSIS CATHETER N/A 09/19/2016   Procedure: INSERTION OF TUNNELED DIALYSIS CATHETER;  Surgeon: Conrad Slovan, MD;  Location: [place];  Service: Vascular;  Laterality: N/A;   IR [place]  11/16/2021   IR ANGIOGRAM VISCERAL SELECTIVE  11/05/2021   IR ANGIOGRAM VISCERAL SELECTIVE  11/16/2021   IR ANGIOGRAM VISCERAL SELECTIVE  11/16/2021   IR  EMBO ART  VEN HEMORR LYMPH EXTRAV  INC GUIDE ROADMAPPING  11/05/2021   IR EMBO ART  VEN HEMORR LYMPH EXTRAV  INC GUIDE ROADMAPPING  11/16/2021   IR GENERIC HISTORICAL  09/14/2016   IR US GUIDE VASC ACCESS RIGHT 09/14/2016 [name], DO [facility]   IR GENERIC HISTORICAL  09/14/2016   IR FLUORO GUIDE CV LINE RIGHT 09/14/2016 [name],  DO [facility]   IR PARACENTESIS  06/22/2021   IR US GUIDE VASC ACCESS RIGHT  11/05/2021   IR US GUIDE VASC ACCESS RIGHT  11/16/2021   LAPAROTOMY N/A 05/23/2021   Procedure: EXPLORATORY LAPAROTOMY LYSIS ADHESIONS;  Surgeon: [name], MD;  Location: [place];  Service: General;  Laterality: N/A;   LAPAROTOMY N/A 11/20/2021   Procedure: EXPLORATORY LAPAROTOMY, REPAIR OF BLEEDING DUODENAL ULCER;  Surgeon: Dwan Bolt, MD;  Location: [place];  Service: General;  Laterality: N/A;   ORIF TIBIA PLATEAU Left 01/15/2018   Procedure: OPEN REDUCTION INTERNAL FIXATION (ORIF) TIBIAL PLATEAU;  Surgeon: Altamese , MD;  Location: [place];  Service: Orthopedics;  Laterality: Left;   SCLEROTHERAPY  11/05/2021   Procedure: Clide Deutscher;  Surgeon: Carol Ada, MD;  Location: [place];  Service: Endoscopy;;   SCLEROTHERAPY  11/16/2021   Procedure: Clide Deutscher;  Surgeon: [name], MD;  Location: [facility] ENDOSCOPY;  Service: Gastroenterology;;   TRANSTHORACIC ECHOCARDIOGRAM  07/2016    EF 60-65%, No RWMA. Mod Concentric LVH - Gr 2 DD. Severe LA dilation. PAP ~35 mmHg (mild Pulm HTN)  --> no changes noted 1 month later   Family History  Family History  Problem Relation Age of Onset   Heart failure Mother        Died at age 52.   Heart attack Mother 45   Hypertension Mother    Diabetes Mellitus II Mother    Other Father        Unknown   Kidney failure Sister        (Oldest Sister)   Other Other        Multiple siblings have started her heart disease, he is not sure of the details.   CAD Nephew    Social History  reports that he has been smoking cigarettes. He has been smoking an average of .25 packs per day. He has never used smokeless tobacco. He reports current drug use. Frequency: 1.00 time per week. Drugs: Marijuana and Heroin. He reports that he does not drink alcohol. Allergies No Known Allergies Home medications Prior to Admission medications   Medication Sig Start Date End Date Taking?  Authorizing Provider  albuterol (VENTOLIN HFA) 108 (90 Base) MCG/ACT inhaler Inhale 2 puffs into the lungs every 6 (six) hours as needed. 01/06/22   [provider]  carvedilol (COREG) 6.25 MG tablet Take 1 tablet (6.25 mg total) by mouth 2 (two) times daily with a meal. 01/01/22   Amponsah, Charisse March, MD  diltiazem (TIAZAC) 360 MG 24 hr capsule Take 360 mg by mouth daily.    [provider]  DULoxetine (CYMBALTA) 20 MG capsule Take 20 mg by mouth daily. 01/06/22   [provider]  LIDOCAINE-PRILOCAINE EX Apply 1 application. topically Every Tuesday,Thursday,and Saturday with dialysis.    [provider]  multivitamin (RENA-VIT) TABS tablet Take 1 tablet by mouth at bedtime. 12/02/21   Mercy Riding, MD  oxyCODONE (OXY IR/ROXICODONE) 5 MG immediate release tablet Take 5 mg by mouth every 8 (eight) hours as needed for pain. 12/09/21   [provider]  pantoprazole (PROTONIX) 40 MG tablet Take 1 tablet (40 mg total) by mouth 2 (two) times daily. Patient not taking: Reported on 02/14/2022 12/02/21   Mercy Riding, MD  sevelamer carbonate (RENVELA) 800 MG tablet Take 800 mg by mouth 2 (two) times daily with a meal. 11/14/21   [provider]     Vitals:   03/13/22 0700 03/13/22 0715 03/13/22 0742 03/13/22 0828  BP: (!) 166/127 (!) 187/125  (!) 196/134  Pulse:    93  Resp: (!) 33 (!) 34  (!) 23  Temp:      TempSrc:      SpO2:   100% 100%   Exam Gen alert, on bipap, RR 19, BP 180/ 126 No rash, cyanosis or gangrene Sclera anicteric, throat clear  +JVD Chest bilat rales 2/3 L and 1/3 up on R RRR no RG Abd firm, slightly distended, no mass +bs GU normal male MS no joint effusions or deformity Ext 2+ bilat pretib edema, no wounds or ulcers Neuro is alert, Ox 3 , nf    LUA AVF+ bruit      Home meds include - albuterol, coreg 6.25 bid, diltiazem 360 qd, duloxetine, renavite, oxy IR, protonix, renvela 800 ac     OP HD: [facility]  3h 29mn    77.5kg   2/2 bath  450/1.5  P4   Hep none  LUA AVF - last HD 5/27, 82.5 > 78.8kg, large gains - last Hb 10.8 on 5/25 - mircera 225 q2, last 5/16, due 5/30 - venofer 100 mg IV weekly, last 5/27 - hectorol 1 ug tiw IV   Assessment/ Plan: AHRF - pt w/ chronic vol overload, high wt gains. Good HD compliance, but today is here w/ resp failure on bipap. BP's very high, hx of malignant HTN. Appears to be improving w/ bipap. Would give him a bit more time to improve from the bipap, and also give IV hydralazine for BP control. If can wean off bipap and remain stable, we then can take him upstairs for HD session around 10- 10:30am. Have d/w ED MD.  Will follow.    ESRD - on HD TTS.  Plan as above.  HTN - uncontrolled, hx malignant HTN. As above. Get vol down. Has been taking his BP pills.  Anemia esrd - Hb14, no esa needs MBD ckd - Ca in range, add on alb/ phos. Cont binders, IV vdra.       RKelly Splinter MD 03/13/2022, 8:40 AM Recent Labs  Lab 03/13/22 0805 03/13/22 0827  HGB 14.3 14.3  CREATININE 6.80*  --   K 5.5* 5.5*

## 2022-03-13 NOTE — Progress Notes (Signed)
removed 3733ms net fluid.  pre bp 203/139 post bp 189/130 unable to take weights due to no scale on stretcher and pt condition.  2 bandages to lua avf no bleeding dressing cdi.  no meds ordered for today. pt in distress upon arrival placed on 15lpm nonrebreather mask.  pt very restless, upon arrival started to settle down mid treatment.

## 2022-03-13 NOTE — Assessment & Plan Note (Signed)
Plan for repeat HD in AM nephrology is aware

## 2022-03-13 NOTE — Progress Notes (Signed)
Pt returned from HD this evening still with O2 requirement.  Had 3.7L removed during rx.  CTA negative for PE, had some mild interstitial edema. Was on 6L O2 and back on BiPaP- per EDP appears comfortable with RR in 20s. Hypertensive- will be treated, hoping for some symptomatic improvement with better control. Placed orders for another rx tomorrow, first shift, priority. Discussed [facility]--> [place] turnover with EDP- if pt needs to be done again tonight- will not be able to be put on past 3 am.  Please call with questions.    Madelon Lips MD [employer] [facility] [phone]

## 2022-03-13 NOTE — ED Notes (Signed)
RN called RT  

## 2022-03-13 NOTE — ED Notes (Signed)
Back from ct.

## 2022-03-13 NOTE — ED Provider Notes (Signed)
[facility] EMERGENCY DEPARTMENT Provider Note   CSN: [id number] Arrival date & time: 03/13/22  4818     History {Add pertinent medical, surgical, social history, OB history to HPI:1} Chief Complaint  Patient presents with   Shortness of Breath    Timothy [name] is a 61 y.o. male.  HPI    61 year old male comes in with chief complaint of shortness of breath. history of ESRD on HD TTS, HTN, peptic ulcer disease-requiring surgical repair-presented to the hospital with shortness of breath.  Patient indicates that his last hemodialysis was on Saturday.  He started having shortness of breath overnight, woke up in the middle night feeling dyspneic.  He is having some chest tightness.  Patient denies any associated nausea, vomiting, fevers, chills, productive cough.  He denies any substance use disorder.    Home Medications Prior to Admission medications   Medication Sig Start Date End Date Taking? Authorizing Provider  diltiazem (TIAZAC) 360 MG 24 hr capsule Take 360 mg by mouth daily.   Yes [provider]  albuterol (VENTOLIN HFA) 108 (90 Base) MCG/ACT inhaler Inhale 2 puffs into the lungs every 6 (six) hours as needed. 01/06/22   [provider]  carvedilol (COREG) 6.25 MG tablet Take 1 tablet (6.25 mg total) by mouth 2 (two) times daily with a meal. 01/01/22   Amponsah, Charisse March, MD  DULoxetine (CYMBALTA) 20 MG capsule Take 20 mg by mouth daily. 01/06/22   [provider]  LIDOCAINE-PRILOCAINE EX Apply 1 application. topically Every Tuesday,Thursday,and Saturday with dialysis.    [provider]  multivitamin (RENA-VIT) TABS tablet Take 1 tablet by mouth at bedtime. 12/02/21   Mercy Riding, MD  oxyCODONE (OXY IR/ROXICODONE) 5 MG immediate release tablet Take 5 mg by mouth every 8 (eight) hours as needed for pain. 12/09/21   [provider]  pantoprazole (PROTONIX) 40 MG tablet Take 1 tablet (40 mg total) by mouth 2 (two)  times daily. Patient not taking: Reported on 02/14/2022 12/02/21   Mercy Riding, MD  sevelamer carbonate (RENVELA) 800 MG tablet Take 800 mg by mouth 2 (two) times daily with a meal. 11/14/21   [provider]      Allergies    Patient has no known allergies.    Review of Systems   Review of Systems  Physical Exam Updated Vital Signs BP (!) 196/134 (BP Location: Right Arm)   Pulse 93   Temp 97.8 F (36.6 C) (Oral)   Resp (!) 23   SpO2 100%  Physical Exam Vitals and nursing note reviewed.  Constitutional:      Appearance: He is well-developed.  HENT:     Head: Atraumatic.  Cardiovascular:     Rate and Rhythm: Normal rate.  Pulmonary:     Effort: Tachypnea and respiratory distress present.     Breath sounds: Examination of the right-lower field reveals rales. Examination of the left-lower field reveals rales. Rales present. No decreased breath sounds, wheezing or rhonchi.     Comments: Respiratory rate about 36 Musculoskeletal:     Cervical back: Neck supple.  Skin:    General: Skin is warm.  Neurological:     Mental Status: He is alert and oriented to person, place, and time.    ED Results / Procedures / Treatments   Labs (all labs ordered are listed, but only abnormal results are displayed) Labs Reviewed  BASIC METABOLIC PANEL - Abnormal; Notable for the following components:  Result Value   Sodium 133 (*)    Potassium 5.6 (*)    Chloride 93 (*)    BUN 44 (*)    Creatinine, Ser 6.31 (*)    Calcium 8.6 (*)    GFR, Estimated 9 (*)    Anion gap 16 (*)    All other components within normal limits  MAGNESIUM - Abnormal; Notable for the following components:   Magnesium 2.7 (*)    All other components within normal limits  PHOSPHORUS - Abnormal; Notable for the following components:   Phosphorus 5.6 (*)    All other components within normal limits  CBC WITH DIFFERENTIAL/PLATELET - Abnormal; Notable for the following components:   Hemoglobin 11.5 (*)     HCT 36.3 (*)    MCH 25.9 (*)    RDW 20.8 (*)    All other components within normal limits  I-STAT CHEM 8, ED - Abnormal; Notable for the following components:   Sodium 131 (*)    Potassium 5.5 (*)    Chloride 96 (*)    BUN 62 (*)    Creatinine, Ser 6.80 (*)    Calcium, Ion 0.89 (*)    All other components within normal limits  I-STAT VENOUS BLOOD GAS, ED - Abnormal; Notable for the following components:   pH, Ven 7.500 (*)    pCO2, Ven 37.9 (*)    pO2, Ven 132 (*)    Bicarbonate 29.5 (*)    Acid-Base Excess 6.0 (*)    Sodium 131 (*)    Potassium 5.5 (*)    Calcium, Ion 0.91 (*)    All other components within normal limits  CBC WITH DIFFERENTIAL/PLATELET  BLOOD GAS, VENOUS  ALBUMIN  HEPATITIS B SURFACE ANTIGEN  HEPATITIS B SURFACE ANTIBODY,QUALITATIVE  HEPATITIS B SURFACE ANTIBODY, QUANTITATIVE    EKG EKG Interpretation  Date/Time:  Monday Mar 13 2022 06:59:54 EDT Ventricular Rate:  92 PR Interval:  197 QRS Duration: 98 QT Interval:  349 QTC Calculation: 432 R Axis:   19 Text Interpretation: Sinus rhythm Borderline low voltage, extremity leads Nonspecific repol abnormality, diffuse leads No acute changes Confirmed by Varney Biles ([id number]) on 03/13/2022 7:11:59 AM  Radiology DG Chest Port 1 View  Result Date: 03/13/2022 CLINICAL DATA:  Short of breath, end-stage renal disease EXAM: PORTABLE CHEST 1 VIEW COMPARISON:  Prior chest x-ray 02/13/2022 FINDINGS: Stable cardiomegaly. Mediastinal contours are unchanged. Mild vascular congestion, slightly less than seen on prior chest x-ray. Persistent chronic atelectasis versus scarring in the right middle and lower lobes with elevation of the right hemidiaphragm. No pneumothorax. Small chronic right pleural effusion remains stable. No acute osseous abnormality. IMPRESSION: 1. Stable chest x-ray without evidence of acute cardiopulmonary process. 2. Stable chronic elevation of the right hemidiaphragm with small effusion and  atelectasis/scarring in the right middle and lower lobes. 3. Stable to slightly improved cardiomegaly and mild vascular congestion. Electronically Signed   By: Jacqulynn Cadet M.D.   On: 03/13/2022 07:22    Procedures .Critical Care Performed by: Varney Biles, MD Authorized by: Varney Biles, MD   Critical care provider statement:    Critical care time (minutes):  52   Critical care was necessary to treat or prevent imminent or life-threatening deterioration of the following conditions:  Respiratory failure, circulatory failure and metabolic crisis   Critical care was time spent personally by me on the following activities:  Development of treatment plan with patient or surrogate, discussions with consultants, evaluation of patient's response to treatment, examination of  patient, ordering and review of laboratory studies, ordering and review of radiographic studies, ordering and performing treatments and interventions, pulse oximetry, re-evaluation of patient's condition and review of old charts  {Document cardiac monitor, telemetry assessment procedure when appropriate:1}  Medications Ordered in ED Medications  albuterol (PROVENTIL) (2.5 MG/3ML) 0.083% nebulizer solution (  Not Given 03/13/22 0744)  Chlorhexidine Gluconate Cloth 2 % PADS 6 each (6 each Topical Not Given 03/13/22 0910)  Darbepoetin Alfa (ARANESP) injection 200 mcg (has no administration in time range)  doxercalciferol (HECTOROL) injection 1 mcg (has no administration in time range)  hydrALAZINE (APRESOLINE) injection 10 mg (10 mg Intravenous Given 03/13/22 7782)    ED Course/ Medical Decision Making/ A&P                           Medical Decision Making Amount and/or Complexity of Data Reviewed Labs: ordered. Radiology: ordered.  Risk Decision regarding hospitalization.   This patient presents to the ED with chief complaint(s) of shortness of breath with pertinent past medical history of hypertensive emergency,  ESRD on HD, polysubstance abuse, anemia of chronic disease which further complicates the presenting complaint. The complaint involves an extensive differential diagnosis and also carries with it a high risk of complications and morbidity.    Patient is in respiratory distress, he is tripoding and has mild rales only in the bases.  Blood pressure is also elevated.  The differential diagnosis includes : ESRD with volume overload, hypertensive emergency with volume overload, aortic dissection, pleural effusion, pneumonia, severe symptomatic anemia, severe electrolyte abnormality.    The initial plan is to place patient on BiPAP given his elevated blood pressure and increased work of breathing. Basic labs and CT scan ordered.   Additional history obtained: Records reviewed previous admission documents  Independent labs interpretation:  The following labs were independently interpreted: pH shows alkalosis, patient has mild hyperkalemia and baseline anemia.  Independent visualization of imaging: - I independently visualized the following imaging with scope of interpretation limited to determining acute life threatening conditions related to emergency care: X-ray of the chest, which revealed no evidence of pneumothorax or mediastinal widening, patient has some pulmonary vascular congestion and cardiomegaly.  Treatment and Reassessment: Patient placed on BiPAP and nephrology was consulted. Nephrology team will take patient to the dialysis unit.  However, they wanted to be sure that patient can tolerate being off of BiPAP  At 9 AM, we took patient off of dialysis for 40 minutes.  He was able to tolerate being on nasal cannula.  His respiratory rate did jump from 18 to about 30.  At 945, discussed the case with nephrology service.  They are comfortable taking him for hemodialysis.  Until he is ready for dialysis, we will place him back on BiPAP to preserve the reserves.  Patient is comfortable with  this plan.  He just wants volume off of him.  His BP is high, we did give him a dose of hydralazine.  Blood pressure is likely related to the dialysis needed as well.  Mild hyper-K with potassium of 5.6 with no EKG changes.  Will allow for dialysis to optimize electrolytes.  CODE STATUS is DNR -specifically patient does not want any CPR.  Consultation: - Consulted or discussed management/test interpretation w/ external professional: Nephrology service.  They will try to dialyze the patient.  Patient will likely be able to go home postdialysis.    Final Clinical Impression(s) / ED Diagnoses Final diagnoses:  Acute respiratory distress  Hypervolemia, unspecified hypervolemia type  Acute hyperkalemia  Hypoxia    Rx / DC Orders ED Discharge Orders     None

## 2022-03-13 NOTE — Assessment & Plan Note (Signed)
restart home meds when able

## 2022-03-13 NOTE — ED Notes (Signed)
Pt placed on 2L [place]

## 2022-03-13 NOTE — ED Notes (Signed)
Respiratory called per Dr. Pincus Sanes request

## 2022-03-13 NOTE — Assessment & Plan Note (Signed)
Chronic stable continue to monitor 

## 2022-03-13 NOTE — ED Notes (Signed)
RN took pt off Bipap per MD order. Pt placed on 2L [place]

## 2022-03-13 NOTE — Assessment & Plan Note (Signed)
Not on anticoagulation.  Continue Coreg and diltiazem when able to restart

## 2022-03-13 NOTE — ED Notes (Signed)
Pt reports worsening SOB, pt requesting to be placed back on bipap. RR 40 oxygen 90% 6L [place]> RT notified, MD notified

## 2022-03-13 NOTE — Assessment & Plan Note (Signed)
nutritional consult in AM

## 2022-03-13 NOTE — ED Triage Notes (Signed)
Pt to ED via EMS. Pt is scheduled for dialysis today. Pt c/o SOB since 10pm last night. Pt on NRB upon arrival to ED. Pt has increased SOB upon arrival to ED. Pt's last dialysis treatment was Saturday. Pt denies pain or any other complaints.    EMS Vitals: 168/100 90 HR 100% NRB

## 2022-03-14 DIAGNOSIS — Z515 Encounter for palliative care: Secondary | ICD-10-CM

## 2022-03-14 DIAGNOSIS — R778 Other specified abnormalities of plasma proteins: Secondary | ICD-10-CM

## 2022-03-14 DIAGNOSIS — N186 End stage renal disease: Secondary | ICD-10-CM | POA: Diagnosis not present

## 2022-03-14 DIAGNOSIS — Z789 Other specified health status: Secondary | ICD-10-CM

## 2022-03-14 DIAGNOSIS — I5043 Acute on chronic combined systolic (congestive) and diastolic (congestive) heart failure: Secondary | ICD-10-CM | POA: Diagnosis not present

## 2022-03-14 DIAGNOSIS — J9601 Acute respiratory failure with hypoxia: Secondary | ICD-10-CM | POA: Diagnosis not present

## 2022-03-14 LAB — POCT I-STAT EG7
Acid-Base Excess: 3 mmol/L — ABNORMAL HIGH (ref 0.0–2.0)
Bicarbonate: 25 mmol/L (ref 20.0–28.0)
Calcium, Ion: 0.96 mmol/L — ABNORMAL LOW (ref 1.15–1.40)
HCT: 42 % (ref 39.0–52.0)
Hemoglobin: 14.3 g/dL (ref 13.0–17.0)
O2 Saturation: 99 %
Potassium: 4.1 mmol/L (ref 3.5–5.1)
Sodium: 134 mmol/L — ABNORMAL LOW (ref 135–145)
TCO2: 26 mmol/L (ref 22–32)
pCO2, Ven: 30 mmHg — ABNORMAL LOW (ref 44–60)
pH, Ven: 7.529 — ABNORMAL HIGH (ref 7.25–7.43)
pO2, Ven: 110 mmHg — ABNORMAL HIGH (ref 32–45)

## 2022-03-14 LAB — C DIFFICILE QUICK SCREEN W PCR REFLEX
C Diff antigen: NEGATIVE
C Diff interpretation: NOT DETECTED
C Diff toxin: NEGATIVE

## 2022-03-14 LAB — HEPATITIS C ANTIBODY: HCV Ab: REACTIVE — AB

## 2022-03-14 LAB — MRSA NEXT GEN BY PCR, NASAL: MRSA by PCR Next Gen: NOT DETECTED

## 2022-03-14 LAB — GLUCOSE, CAPILLARY: Glucose-Capillary: 75 mg/dL (ref 70–99)

## 2022-03-14 LAB — HEPATITIS B CORE ANTIBODY, TOTAL: Hep B Core Total Ab: REACTIVE — AB

## 2022-03-14 MED ORDER — ORAL CARE MOUTH RINSE
15.0000 mL | Freq: Two times a day (BID) | OROMUCOSAL | Status: DC
  Administered 2022-03-14: 15 mL via OROMUCOSAL

## 2022-03-14 MED ORDER — NEPRO/CARBSTEADY PO LIQD
237.0000 mL | Freq: Two times a day (BID) | ORAL | Status: DC
  Administered 2022-03-14 – 2022-03-15 (×2): 237 mL via ORAL

## 2022-03-14 MED ORDER — HYDRALAZINE HCL 25 MG PO TABS
25.0000 mg | ORAL_TABLET | Freq: Four times a day (QID) | ORAL | Status: DC
  Administered 2022-03-14 – 2022-03-15 (×4): 25 mg via ORAL
  Filled 2022-03-14 (×5): qty 1

## 2022-03-14 MED ORDER — OXYCODONE HCL 5 MG PO TABS
2.5000 mg | ORAL_TABLET | Freq: Once | ORAL | Status: AC
  Administered 2022-03-14: 2.5 mg via ORAL
  Filled 2022-03-14: qty 1

## 2022-03-14 MED ORDER — CHLORHEXIDINE GLUCONATE 0.12 % MT SOLN
15.0000 mL | Freq: Two times a day (BID) | OROMUCOSAL | Status: DC
  Administered 2022-03-15: 15 mL via OROMUCOSAL
  Filled 2022-03-14: qty 15

## 2022-03-14 MED ORDER — CHLORHEXIDINE GLUCONATE CLOTH 2 % EX PADS
6.0000 | MEDICATED_PAD | Freq: Every day | CUTANEOUS | Status: DC
  Administered 2022-03-14 – 2022-03-15 (×2): 6 via TOPICAL

## 2022-03-14 MED ORDER — RENA-VITE PO TABS
1.0000 | ORAL_TABLET | Freq: Every day | ORAL | Status: DC
  Administered 2022-03-14: 1 via ORAL
  Filled 2022-03-14: qty 1

## 2022-03-14 MED ORDER — DIPHENHYDRAMINE HCL 50 MG/ML IJ SOLN
25.0000 mg | Freq: Once | INTRAMUSCULAR | Status: AC
  Administered 2022-03-14: 25 mg via INTRAVENOUS
  Filled 2022-03-14: qty 1

## 2022-03-14 NOTE — Progress Notes (Signed)
Dr. Jonnie Finner order Bendaryl '25mg'$  IV

## 2022-03-14 NOTE — Progress Notes (Signed)
Pt to HD

## 2022-03-14 NOTE — Progress Notes (Signed)
Patient refusing labs this AM.  Patient educated on importance of labs, especially his troponin level.  Patient continues to refuse.  Dr. Hal Hope notified via text page. No new orders.

## 2022-03-14 NOTE — Progress Notes (Signed)
[name] Progress Note  Subjective: seen on HD upstairs, goal UF about 4 L today. SOB better.   Vitals:   03/14/22 0300 03/14/22 0357 03/14/22 0400 03/14/22 0700  BP: (!) 176/117  (!) 178/121 (!) 168/110  Pulse: 92 94 93 94  Resp: (!) 23 (!) 27 (!) 22 20  Temp: 98.9 F (37.2 C)   98.9 F (37.2 C)  TempSrc: Axillary   Oral  SpO2: 97% 100% 100% 97%  Weight:      Height:        Exam: Gen drowsy, no distress, HFNC No rash, cyanosis or gangrene Sclera anicteric, throat clear  Chest rales improved, mostly clear RRR no RG Abd firm, slightly distended, no mass +bs Ext 1+ bilat pretib edema Neuro is alert, Ox 3 , nf    LUA AVF+ bruit          Home meds include - albuterol, coreg 6.25 bid, diltiazem 360 qd, duloxetine, renavite, oxy IR, protonix, renvela 800 ac       [facility] HD: [facility]  3h 70mn   77.5kg   2/2 bath  450/1.5  P4   Hep none  LUA AVF - last HD 5/27, 82.5 > 78.8kg, large gains - last Hb 10.8 on 5/25 - mircera 225 q2, last 5/16, due 5/30 - venofer 100 mg IV weekly, last 5/27 - hectorol 1 ug tiw IV     Assessment/ Plan: AHRF - presented w/ resp distress, pulm edema. Required bipap in ED, had HD yest w/  3.7 L off. Is on HFNC today, required some bipap overnight. Getting HD #2 now, max UF.  Chronic vol overload - good compliance w/ HD but not w/ fluid restriction ESRD - on HD TTS.  HD again today.  HTN - uncontrolled, hx malignant HTN. As above. Cont to lower volume. Cont meds per pmd.  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 Cont binders, IV vdra.       Timothy [name] 03/14/2022, 9:11 AM   Recent Labs  Lab 03/13/22 0800 03/13/22 0805 03/13/22 0827 03/13/22 2146 03/13/22 2158  HGB  --  14.3   < > 12.0* 14.3  ALBUMIN  --   --   --  3.1*  3.1*  --   CALCIUM 8.6*  --   --  9.0  --   PHOS 5.6*  --   --  5.5*  --   CREATININE 6.31* 6.80*  --  5.36*  --   K 5.6* 5.5*   < > 4.1 4.1   < > = values in this interval  not displayed.   Inpatient medications:  carvedilol  6.25 mg Oral BID WC   chlorhexidine  15 mL Mouth Rinse BID   Chlorhexidine Gluconate Cloth  6 each Topical Q0600   Chlorhexidine Gluconate Cloth  6 each Topical Q0600   darbepoetin (ARANESP) injection - DIALYSIS  200 mcg Intravenous Q Tue-HD   diltiazem (CARDIZEM CD) 24 hr capsule 360 mg  360 mg Oral Daily   doxercalciferol  1 mcg Intravenous Q T,Th,Sa-HD   hydrALAZINE  25 mg Oral Q6H   mouth rinse  15 mL Mouth Rinse q12n4p   sevelamer carbonate  800 mg Oral BID WC   sodium chloride flush  3 mL Intravenous Q12H    sodium chloride     sodium chloride     sodium chloride     sodium chloride, sodium chloride, sodium chloride, acetaminophen **OR**  acetaminophen, albuterol, alteplase, heparin, lidocaine (PF), lidocaine-prilocaine, oxyCODONE, pentafluoroprop-tetrafluoroeth, sodium chloride flush

## 2022-03-14 NOTE — Progress Notes (Signed)
Pt returned from HD.

## 2022-03-14 NOTE — Progress Notes (Signed)
     Referral received for Timothy [name] AY:GEFUW of care discussion. Chart reviewed and updates received from [employer]. Unable to assess patient since he is in HD. Requested that nursing contact PMT when patient returns from HD.    Attempted to contact patient's wife. Unable to reach. Voicemails left with contact information given on both her mobile and home phone numbers.   [place] meeting to be scheduled.  PMT will re-attempt to contact family at a later time/date. Detailed note and recommendations to follow once GOC has been completed.   Thank you for your referral and allowing PMT to assist in Timothy [name]'s care.   [place] Ilsa Iha, FNP-BC Palliative Medicine Team Team Phone # [phone]  NO CHARGE

## 2022-03-14 NOTE — Progress Notes (Signed)
PT Cancellation Note  Patient Details Name: Timothy [name] MRN: [id number] DOB: [birth year]   Cancelled Treatment:    Reason Eval/Treat Not Completed: Other (comment) Holding PT eval per RN conversation of type 7 BM and dialysis soon.   Havery Moros, Amber, SPT Acute Rehabilitation Services Office: [place] 03/14/2022, 9:03 AM

## 2022-03-14 NOTE — Progress Notes (Signed)
Pt receives out-pt HD at [facility] on TTS. Pt arrives at 5:40 for 6:00 chair time. Will assist as needed.   [name] Renal Navigator [phone]

## 2022-03-14 NOTE — Assessment & Plan Note (Signed)
Patient has chronic elevated troponin though this was somewhat high at from baseline.  No chest pain. Can trend and monitor

## 2022-03-14 NOTE — Progress Notes (Signed)
Pt not seen.

## 2022-03-14 NOTE — ED Provider Notes (Signed)
  Physical Exam  BP (!) 181/119   Pulse (!) 101   Temp (!) 97.5 F (36.4 C) (Oral)   Resp (!) 25   Ht 6' (1.829 m)   Wt 75.5 kg   SpO2 100%   BMI 22.57 kg/m   Physical Exam  Procedures  Procedures  ED Course / MDM    Medical Decision Making Amount and/or Complexity of Data Reviewed Labs: ordered. Radiology: ordered.  Risk Prescription drug management. Decision regarding hospitalization.   61yo male presented with acute dyspnea.  Initially thought to be due to volume overload, sent to dialysis.  After return from dialysis, has continued O2 requirement.  CT PE study ordered and shows no PE, no significant abnormality with exception of mild interstitial edema, mild pleural effusion.  On reeval was off O2 with saturations 91 but tachypnea, placed on 2L O2, then increased to 6, then worsened RR per nursing and placed on BiPAP.  Discussed with Nephrology--given just had 4L taken off, mild edema on CT, will plan on dialysis in AM unless other changes. Is comfortable on BiPAP.   Will recheck labs again, do not suspect significant new acidosis given just had dialysis, previous labs today.  Will admit for further care.        Gareth Morgan, MD 03/14/22 1239

## 2022-03-14 NOTE — Progress Notes (Signed)
Progress Note  Patient: Timothy [name] VZC:[id number] DOB: May 28, 1961  DOA: 03/13/2022  DOS: 03/14/2022    Brief hospital course: Timothy [name] is a 61 y.o. male with a history of ESRD, HTN, PAF who presented to the ED with respiratory distress, hypoxia, shortness of breath with volume overload that improved but did not resolve with hemodialysis. He was admitted on BiPAP with plan for additional dialysis and has developed significant watery diarrhea. He remains severely hypertensive.   Assessment and Plan: Acute hypoxic respiratory failure: Presumably due to volume overload. Improved with HD x1, 3.7L UF but still on BiPAP this AM. Weaned to salter HFNC 6L O2 during HD again this AM. - Monitor oxygen requirement after HD.  - No infiltrate to suggest pneumonia or PE on CTA chest. Troponin elevated but flat without chest pain and is chronically elevated.   ESRD: HD TTS. With hyperkalemia. - HD on 5/29, repeating 5/30. Will be due to regular schedule 5/31. Nephrology following and appreciated.   Acute on chronic combined HFrEF: With improvement in LVEF to 50-55%, G2DD.  - Precipitant may be hypertensive urgency. Manage volume with HD. Tx HTN as below.  HTN urgency, HTN:  - Continue coreg, diltiazem - Add hydralazine q6h prn  - Anticipate improvement with volume removal.  Demand myocardial ischemia: Due to HTN urgency, volume overload. No chest pain. Troponin flat.  - Monitor cardiac telemetry and symptoms.  Diarrhea: Has developed watery stools since arrival. Abdomen is benign. C. diff checked and is negative.  - Check GI pathogen panel.  Paroxysmal atrial fibrillation: Not on anticoagulation at home.  - Continue coreg, diltiazem. Rate is controlled.   Substance abuse: No evidence of withdrawal at this time.  Anemia of ESRD:  - Per nephrology  Subjective: Feels short of breath and just had another loose stool incontinence episode in the bed before I walked in. He wants to eat and  is frustrated he was made NPO while on BiPAP. Legs are less swollen than usual. No abd pain. Did have some coughing up white sputum, no vomiting.   Objective: Vitals:   03/14/22 1219 03/14/22 1300 03/14/22 1343 03/14/22 1358  BP: (!) 192/124 (!) 169/107 (!) 176/107 (!) 166/109  Pulse: 100 96 100 93  Resp: (!) 27 16 (!) 22 (!) 21  Temp:      TempSrc:      SpO2: 97% 94% 97% 98%  Weight:      Height:       Gen: Chronically ill-appearing 61 y.o. male in no distress Pulm: Tachypneic at rest with 6L O2 by HFNC, crackles at bases, no wheezes. CV: Regular rate and rhythm. No murmur, rub, or gallop. Elevated JVD, nowoody, 1+ dependent edema. GI: Abdomen soft, non-tender, non-distended, with normoactive bowel sounds.  Ext: Warm, no deformities Skin: No new rashes, lesions or ulcers on visualized skin. Neuro: Alert and oriented. No focal neurological deficits. Psych: Judgement and insight appear fair. Mood euthymic & affect congruent. Behavior is appropriate.    Data Personally reviewed: NSR, LVH on ECG.  CBC: Recent Labs  Lab 03/13/22 0805 03/13/22 0827 03/13/22 0914 03/13/22 2146 03/13/22 2158  WBC  --   --  5.2 7.1  --   NEUTROABS  --   --  3.6 5.6  --   HGB 14.3 14.3 11.5* 12.0* 14.3  HCT 42.0 42.0 36.3* 38.2* 42.0  MCV  --   --  81.8 82.2  --   PLT  --   --  169 146*  --  Basic Metabolic Panel: Recent Labs  Lab 03/13/22 0800 03/13/22 0805 03/13/22 0827 03/13/22 2146 03/13/22 2158  NA 133* 131* 131* 134* 134*  K 5.6* 5.5* 5.5* 4.1 4.1  CL 93* 96*  --  93*  --   CO2 24  --   --  22  --   GLUCOSE 70 76  --  54*  --   BUN 44* 62*  --  36*  --   CREATININE 6.31* 6.80*  --  5.36*  --   CALCIUM 8.6*  --   --  9.0  --   MG 2.7*  --   --   --   --   PHOS 5.6*  --   --  5.5*  --    GFR: Estimated Creatinine Clearance: 15.5 mL/min (A) (by C-G formula based on SCr of 5.36 mg/dL (H)). Liver Function Tests: Recent Labs  Lab 03/13/22 2146  AST 35  ALT 21  ALKPHOS 136*   BILITOT 2.4*  PROT 8.5*  ALBUMIN 3.1*  3.1*   No results for input(s): LIPASE, AMYLASE in the last 168 hours. Recent Labs  Lab 03/13/22 2146  AMMONIA 83*   Coagulation Profile: No results for input(s): INR, PROTIME in the last 168 hours. Cardiac Enzymes: No results for input(s): CKTOTAL, CKMB, CKMBINDEX, TROPONINI in the last 168 hours. BNP (last 3 results) No results for input(s): PROBNP in the last 8760 hours. HbA1C: No results for input(s): HGBA1C in the last 72 hours. CBG: Recent Labs  Lab 03/14/22 1214  GLUCAP 75   Lipid Profile: No results for input(s): CHOL, HDL, LDLCALC, TRIG, CHOLHDL, LDLDIRECT in the last 72 hours. Thyroid Function Tests: No results for input(s): TSH, T4TOTAL, FREET4, T3FREE, THYROIDAB in the last 72 hours. Anemia Panel: No results for input(s): VITAMINB12, FOLATE, FERRITIN, TIBC, IRON, RETICCTPCT in the last 72 hours. Urine analysis:    Component Value Date/Time   COLORURINE YELLOW 05/19/2021 2015   APPEARANCEUR HAZY (A) 05/19/2021 2015   LABSPEC 1.012 05/19/2021 2015   PHURINE 9.0 (H) 05/19/2021 2015   GLUCOSEU NEGATIVE 05/19/2021 2015   HGBUR NEGATIVE 05/19/2021 2015   [place] 05/19/2021 2015   KETONESUR NEGATIVE 05/19/2021 2015   PROTEINUR >=300 (A) 05/19/2021 2015   NITRITE NEGATIVE 05/19/2021 2015   LEUKOCYTESUR SMALL (A) 05/19/2021 2015   Recent Results (from the past 240 hour(s))  MRSA Next Gen by PCR, Nasal     Status: None   Collection Time: 03/14/22 12:17 AM   Specimen: Nasal Mucosa; Nasal Swab  Result Value Ref Range Status   MRSA by PCR Next Gen NOT DETECTED NOT DETECTED Final    Comment: (NOTE) The GeneXpert MRSA Assay (FDA approved for NASAL specimens only), is one component of a comprehensive MRSA colonization surveillance program. It is not intended to diagnose MRSA infection nor to guide or monitor treatment for MRSA infections. Test performance is not FDA approved in patients less than 39  years old. Performed at [facility], [place] [address], [place], [place] [address]   C Difficile Quick Screen w PCR reflex     Status: None   Collection Time: 03/14/22  4:21 AM   Specimen: STOOL  Result Value Ref Range Status   C Diff antigen NEGATIVE NEGATIVE Final   C Diff toxin NEGATIVE NEGATIVE Final   C Diff interpretation No C. difficile detected.  Final    Comment: Performed at [facility], [place] [address] [place], [place] [address]  CT Angio Chest PE W and/or Wo Contrast  Result Date: 03/13/2022 CLINICAL DATA:  Shortness of breath, chest pain, on dialysis EXAM: CT ANGIOGRAPHY CHEST WITH CONTRAST TECHNIQUE: Multidetector CT imaging of the chest was performed using the standard protocol during bolus administration of intravenous contrast. Multiplanar CT image reconstructions and MIPs were obtained to evaluate the vascular anatomy. RADIATION DOSE REDUCTION: This exam was performed according to the departmental dose-optimization program which includes automated exposure control, adjustment of the mA and/or kV according to patient size and/or use of iterative reconstruction technique. CONTRAST:  33m OMNIPAQUE IOHEXOL 350 MG/ML SOLN COMPARISON:  Chest radiograph dated 03/13/2022. CTA chest dated 10/05/2021. FINDINGS: Cardiovascular: Satisfactory opacification of the bilateral pulmonary arteries to the segmental level. No evidence of pulmonary embolism. Study is not tailored for evaluation of the thoracic aorta. No evidence of thoracic aortic aneurysm. Atherosclerotic calcifications of the aortic arch. Mild cardiomegaly.  No pericardial effusion. Three vessel coronary atherosclerosis. Mediastinum/Nodes: Small mediastinal lymph nodes which do not meet pathologic CT size criteria. Visualized thyroid is unremarkable. Lungs/Pleura: Scattered areas of stable linear/patchy scarring in the lungs bilaterally, right lower lobe predominant. Mild lingular atelectasis. Faint ground-glass  opacity/mosaic attenuation in the lungs bilaterally, similar to the prior, favoring mild interstitial edema. No focal consolidation. No suspicious pulmonary nodules. Small right pleural effusion, including along the right major fissure. No pneumothorax. Upper Abdomen: Visualized upper abdomen is notable for upper abdominal ascites. Loop of bowel beneath the anterior abdominal wall (series 5/image 167) when correlating with prior CT abdomen/pelvis. Musculoskeletal: Visualized osseous structures are within normal limits. Review of the MIP images confirms the above findings. IMPRESSION: No evidence of pulmonary embolism. Cardiomegaly with mild interstitial edema and small right pleural effusion. Upper abdominal ascites. Aortic Atherosclerosis (ICD10-I70.0). Electronically Signed   By: SJulian HyM.D.   On: 03/13/2022 18:05   DG Chest Port 1 View  Result Date: 03/13/2022 CLINICAL DATA:  Short of breath, end-stage renal disease EXAM: PORTABLE CHEST 1 VIEW COMPARISON:  Prior chest x-ray 02/13/2022 FINDINGS: Stable cardiomegaly. Mediastinal contours are unchanged. Mild vascular congestion, slightly less than seen on prior chest x-ray. Persistent chronic atelectasis versus scarring in the right middle and lower lobes with elevation of the right hemidiaphragm. No pneumothorax. Small chronic right pleural effusion remains stable. No acute osseous abnormality. IMPRESSION: 1. Stable chest x-ray without evidence of acute cardiopulmonary process. 2. Stable chronic elevation of the right hemidiaphragm with small effusion and atelectasis/scarring in the right middle and lower lobes. 3. Stable to slightly improved cardiomegaly and mild vascular congestion. Electronically Signed   By: HJacqulynn CadetM.D.   On: 03/13/2022 07:22    Family Communication: None at bedside  Disposition: Status is: Inpatient Remains inpatient appropriate because: Persistent frequent loose stools, persistent hypoxemia Planned Discharge  Destination: Home      RPatrecia Pour MD 03/14/2022 2:34 PM Page by [email or web address] [email or web address]

## 2022-03-14 NOTE — Progress Notes (Signed)
Initial Nutrition Assessment  DOCUMENTATION CODES:   Not applicable  INTERVENTION:   - Nepro Shake po BID, each supplement provides 425 kcal and 19 grams protein  - Renal MVI daily  - Encourage PO intake  NUTRITION DIAGNOSIS:   Increased nutrient needs related to chronic illness (ESRD, CHF) as evidenced by estimated needs.  GOAL:   Patient will meet greater than or equal to 90% of their needs  MONITOR:   PO intake, Supplement acceptance, Labs, Weight trends, I & O's  REASON FOR ASSESSMENT:   Consult Assessment of nutrition requirement/status  ASSESSMENT:   61 year old male who presented to the ED on 5/29 with SOB. PMH of ESRD on HD, anemia, colon cancer, HTN, substance abuse, CHF, bleeding duodenal ulcer s/p surgical repair. Pt admitted with acute respiratory failure.  Pt unavailable at time of RD visit. Pt currently in HD. Last HD was on 03/13/22 with 3750 ml with net UF.  RD is familiar with pt from previous admissions. Pt with a history of severe malnutrition diagnosed during previous admissions.  Pt currently without a diet order. Discussed with RN. Pt did not have a diet order previously due to acute respiratory failure requiring BiPAP. RN reports that pt was transitioned to HFNC this morning prior to HD. RD reached out to MD regarding obtaining a diet order so that pt could receive a meal tray when he returned to room after HD. MD ordered renal diet with 1200 ml fluid restriction.  Unable to obtain diet and weight history from pt at this time. Reviewed weight history in chart. Pt with weight gain from the end of 2022 through March of this year with highest weight being 83 kg on 12/31/21. Per Nephrology, EDW is 77.5 kg. Current weight of 75.5 kg is below EDW. Pt with a 7.5 kg weight loss since 12/31/21. This is a 9% weight loss in less than 3 months which is severe and significant for timeframe. However, unsure how much of weight loss can be attributed to fluid status given  pt with ESRD on HD and CHF.  Suspect pt still with some degree of malnutrition but unable to confirm without NFPE. RD to order daily renal MVI as well as oral nutrition supplement to aid pt in meeting kcal and protein needs.  EDW: 77.5 kg Admit weight: 75.5 kg  Medications reviewed and include: aranesp weekly, hectorol with HD, renvela 800 mg BID  Labs reviewed: sodium 134, ionized calcium 0.96, phosphorus 5.5, magnesium 2.7, platelets 146  I/O's: -3.7 L since admit  NUTRITION - FOCUSED PHYSICAL EXAM:  Unable to complete at this time. Pt in HD.  Diet Order:   Diet Order             Diet renal with fluid restriction Fluid restriction: 1200 mL Fluid; Room service appropriate? Yes; Fluid consistency: Thin  Diet effective now                   EDUCATION NEEDS:   Not appropriate for education at this time  Skin:  Skin Assessment: Reviewed RN Assessment  Last BM:  03/14/22 multiple  Height:   Ht Readings from Last 1 Encounters:  03/13/22 6' (1.829 m)    Weight:   Wt Readings from Last 1 Encounters:  03/13/22 75.5 kg    BMI:  Body mass index is 22.57 kg/m.  Estimated Nutritional Needs:   Kcal:  2100-2300  Protein:  105-125 grams  Fluid:  1000 ml + UOP    Gustavus Bryant,  MS, RD, LDN Inpatient Clinical Dietitian Please see AMiON for contact information.

## 2022-03-14 NOTE — Plan of Care (Signed)
  Problem: Health Behavior/Discharge Planning: Goal: Ability to manage health-related needs will improve Outcome: Not Progressing   Problem: Clinical Measurements: Goal: Ability to maintain clinical measurements within normal limits will improve Outcome: Not Progressing   Problem: Activity: Goal: Risk for activity intolerance will decrease Outcome: Not Progressing   Problem: Nutrition: Goal: Adequate nutrition will be maintained Outcome: Not Progressing   Problem: Coping: Goal: Level of anxiety will decrease Outcome: Not Progressing   Problem: Elimination: Goal: Will not experience complications related to bowel motility Outcome: Not Progressing   Problem: Pain Managment: Goal: General experience of comfort will improve Outcome: Not Progressing   Problem: Safety: Goal: Ability to remain free from injury will improve Outcome: Progressing   Problem: Clinical Measurements: Goal: Cardiovascular complication will be avoided Outcome: Progressing

## 2022-03-14 NOTE — Progress Notes (Signed)
OT Cancellation Note  Patient Details Name: Timothy [name] MRN: [id number] DOB: 1960-10-21   Cancelled Treatment:    Reason Eval/Treat Not Completed: Patient at procedure or test/ unavailable- pt at HD. Will follow and see as able.   Jolaine Artist, OT Acute Rehabilitation Services Office [phone]   Delight Stare 03/14/2022, 10:13 AM

## 2022-03-15 ENCOUNTER — Other Ambulatory Visit (HOSPITAL_COMMUNITY): Payer: Self-pay

## 2022-03-15 ENCOUNTER — Encounter: Payer: Medicare Other | Admitting: Vascular Surgery

## 2022-03-15 ENCOUNTER — Encounter (HOSPITAL_COMMUNITY): Payer: Self-pay | Admitting: Internal Medicine

## 2022-03-15 DIAGNOSIS — B1920 Unspecified viral hepatitis C without hepatic coma: Secondary | ICD-10-CM

## 2022-03-15 LAB — GASTROINTESTINAL PANEL BY PCR, STOOL (REPLACES STOOL CULTURE)

## 2022-03-15 LAB — HCV RNA DIAGNOSIS, NAA: HCV RNA, Quantitation: NOT DETECTED IU/mL

## 2022-03-15 LAB — HEPATITIS B SURFACE ANTIBODY, QUANTITATIVE: Hep B S AB Quant (Post): 49.3 m[IU]/mL (ref >9.9)

## 2022-03-15 MED ORDER — HYDRALAZINE HCL 25 MG PO TABS
25.0000 mg | ORAL_TABLET | Freq: Four times a day (QID) | ORAL | 3 refills | Status: DC
  Filled 2022-03-15: qty 90, 23d supply, fill #0

## 2022-03-15 MED ORDER — RENA-VITE PO TABS
1.0000 | ORAL_TABLET | Freq: Every day | ORAL | 3 refills | Status: DC
  Filled 2022-03-15: qty 30, 30d supply, fill #0

## 2022-03-15 NOTE — Evaluation (Signed)
Physical Therapy Evaluation Patient Details Name: Timothy [name] MRN: [id number] DOB: August 20, 1961 Today's Date: 03/15/2022  History of Present Illness  Pt is a 61 yo male presents with SOB. Pt admitted on 5/29 with acute respiratory failure with hypovolemia and hypertensive urgency. PMH includes ESRD on HD, A fib, HTN, drug abuse, chronic CHF, anemia of chronic disease, and colon cancer.  Clinical Impression  Pt admitted with above diagnosis. Pt currently with functional limitations due to the deficits listed below (see PT Problem List). Prior to admission, pt was independent with ADLs, iADLs, and transfers, but mod I for ambulation and needs spousal assistance with transportation. Upon PT evaluation, pt needed supervision with transfers and ambulation for safety with use of rollator. Pt tolerated 200 feet of ambulation with one standing rest break due to SOB. Pt continues to need stair training for discharge home. Pt will benefit from skilled PT to increase their independence and safety with mobility to allow discharge home. Pt has PRN help from spouse. Will continue to follow acutely.       Recommendations for follow up therapy are one component of a multi-disciplinary discharge planning process, led by the attending physician.  Recommendations may be updated based on patient status, additional functional criteria and insurance authorization.  Follow Up Recommendations No PT follow up    Assistance Recommended at Discharge PRN  Patient can return home with the following  A little help with walking and/or transfers;A little help with bathing/dressing/bathroom;Assistance with cooking/housework;Assist for transportation;Help with stairs or ramp for entrance    Equipment Recommendations None recommended by PT (has rollator and single point cane at home)  Recommendations for Other Services       Functional Status Assessment Patient has had a recent decline in their functional status and  demonstrates the ability to make significant improvements in function in a reasonable and predictable amount of time.     Precautions / Restrictions Precautions Precautions: Fall Restrictions Weight Bearing Restrictions: No      Mobility  Bed Mobility               General bed mobility comments: received pt in chair, placed back in chair    Transfers Overall transfer level: Needs assistance Equipment used: Rollator (4 wheels) Transfers: Sit to/from Stand Sit to Stand: Supervision           General transfer comment: no cues required for rollator use. edge of chair x1    Ambulation/Gait Ambulation/Gait assistance: Supervision Gait Distance (Feet): 200 Feet Assistive device: Rollator (4 wheels) Gait Pattern/deviations: Step-to pattern, Step-through pattern, Decreased stride length, Trunk flexed Gait velocity: decreased Gait velocity interpretation: 1.31 - 2.62 ft/sec, indicative of limited community ambulator   General Gait Details: pt required one standing rest break. pt required cues for maintaining good posture, no cues for safety with use of rollator. heavy reliance on UE support. minimal knee flexion during gait. reported aching LEs towards the end of ambulation.  Stairs            Wheelchair Mobility    Modified Rankin (Stroke Patients Only)       Balance Overall balance assessment: Needs assistance Sitting-balance support: Feet supported, No upper extremity supported Sitting balance-Leahy Scale: Good Sitting balance - Comments: pt able to reach outside BOS as measured by putting down recliner chair without LOB.   Standing balance support: Bilateral upper extremity supported Standing balance-Leahy Scale: Fair Standing balance comment: pt able to lift rollator over table leg without LOB, pt able to abandon  rollator post-ambulation and only hold on to EOB once while in room to get back to chair.                             Pertinent  Vitals/Pain Pain Assessment Pain Assessment: Faces Faces Pain Scale: No hurt Pain Location: in the LEs Pain Descriptors / Indicators: Aching    Home Living Family/patient expects to be discharged to:: Private residence Living Arrangements: Spouse/significant other Available Help at Discharge: Family;Available PRN/intermittently Type of Home: Apartment Home Access: Stairs to enter Entrance Stairs-Rails: Right Entrance Stairs-Number of Steps: 3rd story apt   Home Layout: One level Home Equipment: Cane - single point;Air cabin crew (4 wheels) Additional Comments: lives with wife who's a Freight forwarder who comes home late in the evenings    Prior Function Prior Level of Function : Independent/Modified Independent;History of Falls (last six months)             Mobility Comments: uses rollator in community, no DME use within apartment. does not work, wife drives him. reported 2 falls within last 3 weeks where legs gave out. ADLs Comments: Reports Independent with ADLs, IADls in the home. assist with transportation to HD     Hand Dominance   Dominant Hand: Right    Extremity/Trunk Assessment   Upper Extremity Assessment Upper Extremity Assessment: Defer to OT evaluation    Lower Extremity Assessment Lower Extremity Assessment: Generalized weakness    Cervical / Trunk Assessment Cervical / Trunk Assessment: Other exceptions Cervical / Trunk Exceptions: forward head, rounded shoulders  Communication   Communication: No difficulties  Cognition Arousal/Alertness: Awake/alert Behavior During Therapy: Impulsive Overall Cognitive Status: No family/caregiver present to determine baseline cognitive functioning Area of Impairment: Awareness, Safety/judgement                         Safety/Judgement: Decreased awareness of safety     General Comments: impulsivity could be similar to baseline or related to hospital stay and desire to get home.        General  Comments      Exercises     Assessment/Plan    PT Assessment Patient needs continued PT services  PT Problem List Decreased activity tolerance;Decreased strength;Decreased mobility;Decreased balance;Decreased safety awareness       PT Treatment Interventions Gait training;Stair training;Functional mobility training;Therapeutic activities;Therapeutic exercise;Balance training;Patient/family education    PT Goals (Current goals can be found in the Care Plan section)  Acute Rehab PT Goals Patient Stated Goal: to return home PT Goal Formulation: With patient Time For Goal Achievement: 03/29/22 Potential to Achieve Goals: Good    Frequency Min 3X/week     Co-evaluation               AM-PAC PT "6 Clicks" Mobility  Outcome Measure Help needed turning from your back to your side while in a flat bed without using bedrails?: A Little Help needed moving from lying on your back to sitting on the side of a flat bed without using bedrails?: A Little Help needed moving to and from a bed to a chair (including a wheelchair)?: A Little Help needed standing up from a chair using your arms (e.g., wheelchair or bedside chair)?: A Little Help needed to walk in hospital room?: A Little Help needed climbing 3-5 steps with a railing? : A Little 6 Click Score: 18    End of Session Equipment Utilized During Treatment: Gait belt  Activity Tolerance: Patient tolerated treatment well Patient left: in chair;with call bell/phone within reach Nurse Communication: Mobility status PT Visit Diagnosis: Unsteadiness on feet (R26.81);Other abnormalities of gait and mobility (R26.89)    Time: 0479-9872 PT Time Calculation (min) (ACUTE ONLY): 16 min   Charges:   PT Evaluation $PT Eval Moderate Complexity: [place], [place], [place] Acute Rehabilitation Services Office: [phone]   Havery Moros 03/15/2022, 10:28 AM

## 2022-03-15 NOTE — Progress Notes (Signed)
Subjective: Seen in room said tolerated dialysis yesterday, had 3.750 L UF, denies any shortness of breath or chest pain, noted for discharge today  Objective Vital signs in last 24 hours: Vitals:   03/15/22 0314 03/15/22 0638 03/15/22 0746 03/15/22 1100  BP: (!) 135/92 (!) 148/97 124/87 (!) 156/93  Pulse:   60 76  Resp: '12  16 20  '$ Temp: (!) 97.5 F (36.4 C)  98.1 F (36.7 C) 97.9 F (36.6 C)  TempSrc: Oral  Oral Oral  SpO2:   95% 98%  Weight:      Height:       Weight change:   Physical Exam: General: Alert sitting up in chair and chronically ill male NAD Heart: RRR no MRG Lungs: CTA bilaterally nonlabored breathing Abdomen: NABS soft NT ND no ascites Extremities: Trace bilateral pretibial edema Dialysis Access: LUA AV fistula positive bruit     Home meds include - albuterol, coreg 6.25 bid, diltiazem 360 qd, duloxetine, renavite, oxy IR, protonix, renvela 800 ac       OP HD: [facility]  3h 23mn   77.5kg   2/2 bath  450/1.5  P4   Hep none  LUA AVF - last HD 5/27, 82.5 > 78.8kg, large gains - last Hb 10.8 on 5/25 - mircera 225 q2, last 5/16, due 5/30 - venofer 100 mg IV weekly, last 5/27 - hectorol 1 ug tiw IV     Problem/Plan:  AHRF - presented w/ resp distress, pulm edema. Required bipap in ED, had HD yest w/  3.7 L off.  And again on the dialysis yesterday 3.75 L UF tolerated.  Will have lower dry weight at outpatient.  [facility]  Chronic vol overload - good compliance w/ HD but not w/ fluid restriction ESRD - on HD [facility].  HD yesterday on schedule   HTN - uncontrolled, hx malignant HTN.  Volume played role this time also.  This a.m. improved /hydralazine 25 mg q 6 hr added to home meds.  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 Cont binders, IV vdra.   DErnest Haber PA-C [facility] Beeper [phone]/31/2023,12:59 PM  LOS: 2 days   Labs: Basic Metabolic  Panel: Recent Labs  Lab 03/13/22 0800 03/13/22 0805 03/13/22 0827 03/13/22 2146 03/13/22 2158  NA 133* 131* 131* 134* 134*  K 5.6* 5.5* 5.5* 4.1 4.1  CL 93* 96*  --  93*  --   CO2 24  --   --  22  --   GLUCOSE 70 76  --  54*  --   BUN 44* 62*  --  36*  --   CREATININE 6.31* 6.80*  --  5.36*  --   CALCIUM 8.6*  --   --  9.0  --   PHOS 5.6*  --   --  5.5*  --    Liver Function Tests: Recent Labs  Lab 03/13/22 2146  AST 35  ALT 21  ALKPHOS 136*  BILITOT 2.4*  PROT 8.5*  ALBUMIN 3.1*  3.1*   No results for input(s): LIPASE, AMYLASE in the last 168 hours. Recent Labs  Lab 03/13/22 2146  AMMONIA 83*   CBC: Recent Labs  Lab 03/13/22 0914 03/13/22 2146 03/13/22 2158  WBC 5.2 7.1  --   NEUTROABS 3.6 5.6  --   HGB 11.5* 12.0* 14.3  HCT 36.3* 38.2* 42.0  MCV  81.8 82.2  --   PLT 169 146*  --    Cardiac Enzymes: No results for input(s): CKTOTAL, CKMB, CKMBINDEX, TROPONINI in the last 168 hours. CBG: Recent Labs  Lab 03/14/22 1214  GLUCAP 75    Studies/Results: CT Angio Chest PE W and/or Wo Contrast  Result Date: 03/13/2022 CLINICAL DATA:  Shortness of breath, chest pain, on dialysis EXAM: CT ANGIOGRAPHY CHEST WITH CONTRAST TECHNIQUE: Multidetector CT imaging of the chest was performed using the standard protocol during bolus administration of intravenous contrast. Multiplanar CT image reconstructions and MIPs were obtained to evaluate the vascular anatomy. RADIATION DOSE REDUCTION: This exam was performed according to the departmental dose-optimization program which includes automated exposure control, adjustment of the mA and/or kV according to patient size and/or use of iterative reconstruction technique. CONTRAST:  30m OMNIPAQUE IOHEXOL 350 MG/ML SOLN COMPARISON:  Chest radiograph dated 03/13/2022. CTA chest dated 10/05/2021. FINDINGS: Cardiovascular: Satisfactory opacification of the bilateral pulmonary arteries to the segmental level. No evidence of pulmonary  embolism. Study is not tailored for evaluation of the thoracic aorta. No evidence of thoracic aortic aneurysm. Atherosclerotic calcifications of the aortic arch. Mild cardiomegaly.  No pericardial effusion. Three vessel coronary atherosclerosis. Mediastinum/Nodes: Small mediastinal lymph nodes which do not meet pathologic CT size criteria. Visualized thyroid is unremarkable. Lungs/Pleura: Scattered areas of stable linear/patchy scarring in the lungs bilaterally, right lower lobe predominant. Mild lingular atelectasis. Faint ground-glass opacity/mosaic attenuation in the lungs bilaterally, similar to the prior, favoring mild interstitial edema. No focal consolidation. No suspicious pulmonary nodules. Small right pleural effusion, including along the right major fissure. No pneumothorax. Upper Abdomen: Visualized upper abdomen is notable for upper abdominal ascites. Loop of bowel beneath the anterior abdominal wall (series 5/image 167) when correlating with prior CT abdomen/pelvis. Musculoskeletal: Visualized osseous structures are within normal limits. Review of the MIP images confirms the above findings. IMPRESSION: No evidence of pulmonary embolism. Cardiomegaly with mild interstitial edema and small right pleural effusion. Upper abdominal ascites. Aortic Atherosclerosis (ICD10-I70.0). Electronically Signed   By: SJulian HyM.D.   On: 03/13/2022 18:05   Medications:  sodium chloride     sodium chloride     sodium chloride      carvedilol  6.25 mg Oral BID WC   chlorhexidine  15 mL Mouth Rinse BID   Chlorhexidine Gluconate Cloth  6 each Topical Q0600   Chlorhexidine Gluconate Cloth  6 each Topical Q0600   diltiazem (CARDIZEM CD) 24 hr capsule 360 mg  360 mg Oral Daily   doxercalciferol  1 mcg Intravenous Q T,Th,Sa-HD   feeding supplement (NEPRO CARB STEADY)  237 mL Oral BID BM   hydrALAZINE  25 mg Oral Q6H   mouth rinse  15 mL Mouth Rinse q12n4p   multivitamin  1 tablet Oral QHS   sevelamer  carbonate  800 mg Oral BID WC   sodium chloride flush  3 mL Intravenous Q12H

## 2022-03-15 NOTE — Progress Notes (Signed)
D/C order noted. Contacted [facility] to advise clinic of pt's d/c today and that pt will resume care tomorrow.   Clinten Howk Renal Navigator [phone] 

## 2022-03-15 NOTE — Evaluation (Signed)
Occupational Therapy Evaluation Patient Details Name: Timothy [name] MRN: [id number] DOB: Jan 19, 1961 Today's Date: 03/15/2022   History of Present Illness Pt is a 61 yo male presents with SOB. Pt admitted on 5/29 with acute respiratory failure with hypovolemia and hypertensive urgency. PMH includes ESRD on HD, A fib, HTN, drug abuse, chronic CHF, anemia of chronic disease, and colon cancer.   Clinical Impression   Patient admitted for the diagnosis above.  Patient is known to this OT, and is very close to his prior level of function.  He continues to live with his daughter, and she is able to provide needed assist with IADL and community mobility.  No acute or post acute OT needs identified.        Recommendations for follow up therapy are one component of a multi-disciplinary discharge planning process, led by the attending physician.  Recommendations may be updated based on patient status, additional functional criteria and insurance authorization.   Follow Up Recommendations  No OT follow up    Assistance Recommended at Discharge Set up Supervision/Assistance  Patient can return home with the following      Functional Status Assessment  Patient has had a recent decline in their functional status and demonstrates the ability to make significant improvements in function in a reasonable and predictable amount of time.  Equipment Recommendations  None recommended by OT    Recommendations for Other Services       Precautions / Restrictions Precautions Precautions: Fall Restrictions Weight Bearing Restrictions: No      Mobility Bed Mobility Overal bed mobility: Needs Assistance             General bed mobility comments: Up in recliner    Transfers Overall transfer level: Needs assistance   Transfers: Sit to/from Stand Sit to Stand: Supervision                  Balance Overall balance assessment: Needs assistance Sitting-balance support: Feet  supported Sitting balance-Leahy Scale: Good     Standing balance support: Bilateral upper extremity supported Standing balance-Leahy Scale: Fair                             ADL either performed or assessed with clinical judgement   ADL                                         General ADL Comments: Generalized supervision from sit/stand level     Vision Patient Visual Report: No change from baseline       Perception Perception Perception: Not tested   Praxis Praxis Praxis: Not tested    Pertinent Vitals/Pain Pain Assessment Pain Assessment: No/denies pain Pain Intervention(s): Limited activity within patient's tolerance     Hand Dominance Right   Extremity/Trunk Assessment Upper Extremity Assessment Upper Extremity Assessment: Overall WFL for tasks assessed   Lower Extremity Assessment Lower Extremity Assessment: Defer to PT evaluation   Cervical / Trunk Assessment Cervical / Trunk Assessment: Other exceptions Cervical / Trunk Exceptions: forward head, rounded shoulders   Communication Communication Communication: No difficulties   Cognition Arousal/Alertness: Awake/alert Behavior During Therapy: WFL for tasks assessed/performed Overall Cognitive Status: Within Functional Limits for tasks assessed  General Comments       Exercises     Shoulder Instructions      Home Living Family/patient expects to be discharged to:: Private residence Living Arrangements: Spouse/significant other Available Help at Discharge: Family;Available PRN/intermittently Type of Home: Apartment Home Access: Stairs to enter [employer] of Steps: 3rd story apt Entrance Stairs-Rails: Right Home Layout: One level     Bathroom Shower/Tub: Teacher, early years/pre: Standard Bathroom Accessibility: Yes   Home Equipment: Cane - single point;Air cabin crew (4 wheels)    Additional Comments: lives with daughter who's a pre-K teacher who comes home late in the evenings      Prior Functioning/Environment Prior Level of Function : Independent/Modified Independent;History of Falls (last six months)             Mobility Comments: uses rollator in community, no DME use within apartment. does not work, wife drives him. reported 2 falls within last 3 weeks where legs gave out. ADLs Comments: Reports Independent with ADLs, IADls in the home. assist with transportation to HD        OT Problem List: Impaired balance (sitting and/or standing);Decreased safety awareness      OT Treatment/Interventions:      OT Goals(Current goals can be found in the care plan section) Acute Rehab OT Goals Patient Stated Goal: Go home today OT Goal Formulation: With patient Time For Goal Achievement: 03/17/22 Potential to Achieve Goals: Fair  OT Frequency:      Co-evaluation              AM-PAC OT "6 Clicks" Daily Activity     Outcome Measure Help from another person eating meals?: None Help from another person taking care of personal grooming?: None Help from another person toileting, which includes using toliet, bedpan, or urinal?: A Little Help from another person bathing (including washing, rinsing, drying)?: A Little Help from another person to put on and taking off regular upper body clothing?: None Help from another person to put on and taking off regular lower body clothing?: A Little 6 Click Score: 21   End of Session Nurse Communication: Mobility status  Activity Tolerance: Patient tolerated treatment well Patient left: in chair;with call bell/phone within reach  OT Visit Diagnosis: Unsteadiness on feet (R26.81)                Time: 4132-4401 OT Time Calculation (min): 15 min Charges:  OT General Charges $OT Visit: 1 Visit OT Evaluation $OT Eval Moderate Complexity: 1 Mod  03/15/2022  RP, OTR/L  Acute Rehabilitation Services  Office:   [phone]   Metta Clines 03/15/2022, 10:46 AM

## 2022-03-15 NOTE — Discharge Summary (Signed)
Physician Discharge Summary   Patient: Timothy [name] MRN: [id number] DOB: [birth year]  Admit date:     03/13/2022  Discharge date: 03/15/22  Discharge Physician: Edwin Dada   PCP: [name], MD     Recommendations at discharge:  Follow up with PCP Dr. Vista Lawman in 1 week Dr. 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 Maler is a 61 y.o. M with ESRD, HTN, PAF, hep C, polysubstance abuse and hx noncompliance with HD who presented with dyspnea and hypoxia requiring BiPAP.  Placed on BiPAP and underwent serial dialysis.  Weaned to room air, ambulated without dyspnea.    Did have some diarrhea, negative for Cdiff or common infectious pathogens.  Resolved by itself without intervention.             The [facility] Controlled Substances Registry was reviewed for this patient prior to discharge.   Disposition: Home   DISCHARGE MEDICATION: Allergies as of 03/15/2022   No Known Allergies      Medication List     STOP taking these medications    pantoprazole 40 MG tablet Commonly known as: PROTONIX       TAKE these medications    albuterol 108 (90 Base) MCG/ACT inhaler Commonly known as: VENTOLIN HFA Inhale 2 puffs into the lungs every 6 (six) hours as needed for wheezing or shortness of breath.   carvedilol 6.25 MG tablet Commonly known as: COREG Take 1 tablet (6.25 mg total) by mouth 2 (two) times daily with a meal.   diltiazem 360 MG 24 hr  capsule Commonly known as: TIAZAC Take 360 mg by mouth daily.   DULoxetine 20 MG capsule Commonly known as: CYMBALTA Take 20 mg by mouth daily.   hydrALAZINE 25 MG tablet Commonly known as: APRESOLINE Take 1 tablet (25 mg total) by mouth every 6 (six) hours.   LIDOCAINE-PRILOCAINE EX Apply 1 application. topically Every Tuesday,Thursday,and Saturday with dialysis.   multivitamin Tabs tablet Take 1 tablet by mouth at bedtime.   oxyCODONE 5 MG immediate release tablet Commonly known as: Oxy IR/ROXICODONE Take 5 mg by mouth every 8 (eight) hours as needed for pain.   sevelamer carbonate 800 MG tablet Commonly known as: RENVELA Take 800 mg by mouth 2 (two) times daily with a meal.        Follow-up Information     Osei-Bonsu, George, MD. Schedule an appointment as soon as possible for a visit in 1 week(s).   Specialty: Internal Medicine Contact information: [place] [address] [phone]                 Discharge Instructions     Discharge instructions   Complete by: As directed    From Dr. Loleta Books: You were admitted for fluid overload causing fluid to fill your lungs. This probably happened from drinking too much fluids (or eating too much ice, as you said), missing dialysis sessions, or both.  Limit your water  and fluid intake Do not miss dialysis sessions Go see your primary doctor in 1 week  While you were here, you were found to have diarrhea.  You tested negative for C diff and salmonella and other common infections that cause diarrhea.  Important: Our testing here again suggested that you have hepatitis C.  If you have not been previously evaluated for hepatitis, it is important Dr. Vista Lawman refers you for evaluation  Also, for your blood pressure, continue your normal home carvedilol and diltiazem ADD the new medicine hydralazine 25 mg three times daily   Increase activity slowly   Complete by: As directed    No wound care    Complete by: As directed        Discharge Exam: Filed Weights   03/13/22 2227  Weight: 75.5 kg    General: Pt is alert, awake, not in acute distress Cardiovascular: RRR, nl S1-S2, no murmurs appreciated.   No LE edema.   Respiratory: Normal respiratory rate and rhythm.  CTAB without rales or wheezes. Abdominal: Abdomen soft and non-tender.  No distension or HSM.   Neuro/Psych: Strength symmetric in upper and lower extremities.  Judgment and insight appear normal.   Condition at discharge: good  The results of significant diagnostics from this hospitalization (including imaging, microbiology, ancillary and laboratory) are listed below for reference.   Imaging Studies: CT ABDOMEN PELVIS WO CONTRAST  Result Date: 02/14/2022 CLINICAL DATA:  Acute generalized abdominal pain. EXAM: CT ABDOMEN AND PELVIS WITHOUT CONTRAST TECHNIQUE: Multidetector CT imaging of the abdomen and pelvis was performed following the standard protocol without IV contrast. RADIATION DOSE REDUCTION: This exam was performed according to the departmental dose-optimization program which includes automated exposure control, adjustment of the mA and/or kV according to patient size and/or use of iterative reconstruction technique. COMPARISON:  November 20, 2021. November 12, 2021. June 21, 2021. FINDINGS: Lower chest: Small right pleural effusion is noted. Right lower and middle lobe opacities are noted concerning for pneumonia or atelectasis. Hepatobiliary: No focal liver abnormality is seen. No gallstones, gallbladder wall thickening, or biliary dilatation. Pancreas: Unremarkable. No pancreatic ductal dilatation or surrounding inflammatory changes. Spleen: Normal in size without focal abnormality. Adrenals/Urinary Tract: Adrenal glands appear normal. Bilateral renal atrophy is noted with multiple cysts. This includes stable large right renal complex lesion measuring 3.5 cm which is most consistent with hemorrhagic  proteinaceous cyst as noted on prior MRI. No hydronephrosis or renal obstruction is noted. Urinary bladder is decompressed. Stomach/Bowel: Large amount of food debris is seen in the stomach. No abnormal bowel dilatation is noted. Status post right hemicolectomy. Vascular/Lymphatic: Aortic atherosclerosis. No enlarged abdominal or pelvic lymph nodes. Reproductive: Prostate is unremarkable. Other: Moderate ascites is noted.  No hernia is seen. Musculoskeletal: No acute or significant osseous findings. IMPRESSION: Small right pleural effusion is noted. Right lower lobe and middle lobe opacities are noted concerning for pneumonia or atelectasis. Moderate ascites is noted. Large amount of food debris is noted in the stomach. Bilateral renal atrophy is noted consistent with end-stage renal disease. Multiple cysts are noted. Stable right renal lesion is noted consistent with hemorrhagic proteinaceous cyst as noted on recent MRI. Aortic Atherosclerosis (ICD10-I70.0). Electronically Signed   By: Marijo Conception M.D.   On: 02/14/2022 14:35   CT Angio Chest PE W and/or Wo Contrast  Result Date: 03/13/2022 CLINICAL DATA:  Shortness of breath, chest pain, on dialysis EXAM: CT ANGIOGRAPHY CHEST WITH CONTRAST TECHNIQUE: Multidetector CT imaging of the chest was performed using the  standard protocol during bolus administration of intravenous contrast. Multiplanar CT image reconstructions and MIPs were obtained to evaluate the vascular anatomy. RADIATION DOSE REDUCTION: This exam was performed according to the departmental dose-optimization program which includes automated exposure control, adjustment of the mA and/or kV according to patient size and/or use of iterative reconstruction technique. CONTRAST:  82m OMNIPAQUE IOHEXOL 350 MG/ML SOLN COMPARISON:  Chest radiograph dated 03/13/2022. CTA chest dated 10/05/2021. FINDINGS: Cardiovascular: Satisfactory opacification of the bilateral pulmonary arteries to the segmental level.  No evidence of pulmonary embolism. Study is not tailored for evaluation of the thoracic aorta. No evidence of thoracic aortic aneurysm. Atherosclerotic calcifications of the aortic arch. Mild cardiomegaly.  No pericardial effusion. Three vessel coronary atherosclerosis. Mediastinum/Nodes: Small mediastinal lymph nodes which do not meet pathologic CT size criteria. Visualized thyroid is unremarkable. Lungs/Pleura: Scattered areas of stable linear/patchy scarring in the lungs bilaterally, right lower lobe predominant. Mild lingular atelectasis. Faint ground-glass opacity/mosaic attenuation in the lungs bilaterally, similar to the prior, favoring mild interstitial edema. No focal consolidation. No suspicious pulmonary nodules. Small right pleural effusion, including along the right major fissure. No pneumothorax. Upper Abdomen: Visualized upper abdomen is notable for upper abdominal ascites. Loop of bowel beneath the anterior abdominal wall (series 5/image 167) when correlating with prior CT abdomen/pelvis. Musculoskeletal: Visualized osseous structures are within normal limits. Review of the MIP images confirms the above findings. IMPRESSION: No evidence of pulmonary embolism. Cardiomegaly with mild interstitial edema and small right pleural effusion. Upper abdominal ascites. Aortic Atherosclerosis (ICD10-I70.0). Electronically Signed   By: SJulian HyM.D.   On: 03/13/2022 18:05   DG Chest Port 1 View  Result Date: 03/13/2022 CLINICAL DATA:  Short of breath, end-stage renal disease EXAM: PORTABLE CHEST 1 VIEW COMPARISON:  Prior chest x-ray 02/13/2022 FINDINGS: Stable cardiomegaly. Mediastinal contours are unchanged. Mild vascular congestion, slightly less than seen on prior chest x-ray. Persistent chronic atelectasis versus scarring in the right middle and lower lobes with elevation of the right hemidiaphragm. No pneumothorax. Small chronic right pleural effusion remains stable. No acute osseous  abnormality. IMPRESSION: 1. Stable chest x-ray without evidence of acute cardiopulmonary process. 2. Stable chronic elevation of the right hemidiaphragm with small effusion and atelectasis/scarring in the right middle and lower lobes. 3. Stable to slightly improved cardiomegaly and mild vascular congestion. Electronically Signed   By: HJacqulynn CadetM.D.   On: 03/13/2022 07:22   ECHOCARDIOGRAM COMPLETE  Result Date: 02/14/2022    ECHOCARDIOGRAM REPORT   Patient Name:   VAIDENJAMES HECKMANNDate of Exam: 02/14/2022 Medical Rec #:  [id number]      Height:       73.0 in Accession #:    [id number]     Weight:       182.1 lb Date of Birth:  419-Oct-1962      BSA:          2.068 m Patient Age:    89years        BP:           144/89 mmHg Patient Gender: M               HR:           94 bpm. Exam Location:  Inpatient Procedure: 2D Echo, Cardiac Doppler and Color Doppler Indications:    CHF  History:        Patient has prior history of Echocardiogram examinations. CHF,  Arrythmias:Atrial Fibrillation; Risk Factors:Hypertension.  Sonographer:    Jyl Heinz Referring Phys: Kirby  1. Left ventricular ejection fraction, by estimation, is 50 to 55%. The left ventricle has low normal function. The left ventricle has no regional wall motion abnormalities. The left ventricular internal cavity size was mildly dilated. There is moderate  left ventricular hypertrophy. Left ventricular diastolic parameters are consistent with Grade II diastolic dysfunction (pseudonormalization). Elevated left atrial pressure.  2. Right ventricular systolic function is mildly reduced. The right ventricular size is mildly enlarged. There is moderately elevated pulmonary artery systolic pressure. The estimated right ventricular systolic pressure is 85.0 mmHg.  3. Left atrial size was moderately dilated.  4. Right atrial size was mildly dilated.  5. The mitral valve is normal in structure. Mild mitral valve  regurgitation. No evidence of mitral stenosis.  6. Tricuspid valve regurgitation is moderate to severe.  7. The aortic valve is tricuspid. Aortic valve regurgitation is trivial. Aortic valve sclerosis/calcification is present, without any evidence of aortic stenosis.  8. Aortic dilatation noted. There is mild dilatation of the ascending aorta, measuring 38 mm.  9. The inferior vena cava is dilated in size with <50% respiratory variability, suggesting right atrial pressure of 15 mmHg. FINDINGS  Left Ventricle: Left ventricular ejection fraction, by estimation, is 50 to 55%. The left ventricle has low normal function. The left ventricle has no regional wall motion abnormalities. The left ventricular internal cavity size was mildly dilated. There is moderate left ventricular hypertrophy. Left ventricular diastolic parameters are consistent with Grade II diastolic dysfunction (pseudonormalization). Elevated left atrial pressure. Right Ventricle: The right ventricular size is mildly enlarged. No increase in right ventricular wall thickness. Right ventricular systolic function is mildly reduced. There is moderately elevated pulmonary artery systolic pressure. The tricuspid regurgitant velocity is 3.23 m/s, and with an assumed right atrial pressure of 15 mmHg, the estimated right ventricular systolic pressure is 27.7 mmHg. Left Atrium: Left atrial size was moderately dilated. Right Atrium: Right atrial size was mildly dilated. Pericardium: There is no evidence of pericardial effusion. Mitral Valve: The mitral valve is normal in structure. Mild mitral valve regurgitation. No evidence of mitral valve stenosis. Tricuspid Valve: The tricuspid valve is normal in structure. Tricuspid valve regurgitation is moderate to severe. Aortic Valve: The aortic valve is tricuspid. Aortic valve regurgitation is trivial. Aortic regurgitation PHT measures 411 msec. Aortic valve sclerosis/calcification is present, without any evidence of  aortic stenosis. Aortic valve peak gradient measures 7.6 mmHg. Pulmonic Valve: The pulmonic valve was grossly normal. Pulmonic valve regurgitation is trivial. Aorta: The aortic root is normal in size and structure and aortic dilatation noted. There is mild dilatation of the ascending aorta, measuring 38 mm. Venous: The inferior vena cava is dilated in size with less than 50% respiratory variability, suggesting right atrial pressure of 15 mmHg. IAS/Shunts: The interatrial septum was not well visualized.  LEFT VENTRICLE PLAX 2D LVIDd:         5.80 cm      Diastology LVIDs:         4.10 cm      LV e' medial:    6.74 cm/s LV PW:         1.30 cm      LV E/e' medial:  14.3 LV IVS:        1.30 cm      LV e' lateral:   8.38 cm/s LVOT diam:     2.00 cm  LV E/e' lateral: 11.5 LV SV:         72 LV SV Index:   35 LVOT Area:     3.14 cm  LV Volumes (MOD) LV vol d, MOD A2C: 169.0 ml LV vol d, MOD A4C: 224.0 ml LV vol s, MOD A2C: 75.0 ml LV vol s, MOD A4C: 96.8 ml LV SV MOD A2C:     94.0 ml LV SV MOD A4C:     224.0 ml LV SV MOD BP:      113.4 ml RIGHT VENTRICLE            IVC RV Basal diam:  4.20 cm    IVC diam: 2.40 cm RV Mid diam:    3.20 cm RV S prime:     8.92 cm/s TAPSE (M-mode): 2.0 cm LEFT ATRIUM             Index        RIGHT ATRIUM           Index LA diam:        5.50 cm 2.66 cm/m   RA Area:     20.30 cm LA Vol (A2C):   78.8 ml 38.11 ml/m  RA Volume:   62.30 ml  30.13 ml/m LA Vol (A4C):   98.1 ml 47.45 ml/m LA Biplane Vol: 94.9 ml 45.90 ml/m  AORTIC VALVE AV Area (Vmax): 2.94 cm AV Vmax:        138.00 cm/s AV Peak Grad:   7.6 mmHg LVOT Vmax:      129.00 cm/s LVOT Vmean:     87.900 cm/s LVOT VTI:       0.228 m AI PHT:         411 msec  AORTA Ao Root diam: 3.30 cm Ao Asc diam:  3.80 cm MITRAL VALVE               TRICUSPID VALVE MV Area (PHT): 4.21 cm    TR Peak grad:   41.7 mmHg MV Decel Time: 180 msec    TR Vmax:        323.00 cm/s MR Peak grad: 96.6 mmHg MR Mean grad: 65.0 mmHg    SHUNTS MR Vmax:      491.50  cm/s  Systemic VTI:  0.23 m MR Vmean:     391.0 cm/s   Systemic Diam: 2.00 cm MV E velocity: 96.30 cm/s MV A velocity: 53.10 cm/s MV E/A ratio:  1.81 Oswaldo Milian MD Electronically signed by Oswaldo Milian MD Signature Date/Time: 02/14/2022/2:02:48 PM    Final     Microbiology: Results for orders placed or performed during the hospital encounter of 03/13/22  MRSA Next Gen by PCR, Nasal     Status: None   Collection Time: 03/14/22 12:17 AM   Specimen: Nasal Mucosa; Nasal Swab  Result Value Ref Range Status   MRSA by PCR Next Gen NOT DETECTED NOT DETECTED Final    Comment: (NOTE) The GeneXpert MRSA Assay (FDA approved for NASAL specimens only), is one component of a comprehensive MRSA colonization surveillance program. It is not intended to diagnose MRSA infection nor to guide or monitor treatment for MRSA infections. Test performance is not FDA approved in patients less than 25 years old. Performed at [facility], [place] [address], [place], [place] [address]   C Difficile Quick Screen w PCR reflex     Status: None   Collection Time: 03/14/22  4:21 AM   Specimen: STOOL  Result Value Ref  Range Status   C Diff antigen NEGATIVE NEGATIVE Final   C Diff toxin NEGATIVE NEGATIVE Final   C Diff interpretation No C. difficile detected.  Final    Comment: Performed at [facility], [place] [address], [place], [place] [address]  Gastrointestinal Panel by PCR , Stool     Status: None   Collection Time: 03/14/22  9:04 AM   Specimen: Stool  Result Value Ref Range Status   Campylobacter species NOT DETECTED NOT DETECTED Final   Plesimonas shigelloides NOT DETECTED NOT DETECTED Final   Salmonella species NOT DETECTED NOT DETECTED Final   Yersinia enterocolitica NOT DETECTED NOT DETECTED Final   Vibrio species NOT DETECTED NOT DETECTED Final   Vibrio cholerae NOT DETECTED NOT DETECTED Final   Enteroaggregative E coli (EAEC) NOT DETECTED NOT DETECTED Final   Enteropathogenic E  coli (EPEC) NOT DETECTED NOT DETECTED Final   Enterotoxigenic E coli (ETEC) NOT DETECTED NOT DETECTED Final   Shiga like toxin producing E coli (STEC) NOT DETECTED NOT DETECTED Final   Shigella/Enteroinvasive E coli (EIEC) NOT DETECTED NOT DETECTED Final   Cryptosporidium NOT DETECTED NOT DETECTED Final   Cyclospora cayetanensis NOT DETECTED NOT DETECTED Final   Entamoeba histolytica NOT DETECTED NOT DETECTED Final   Giardia lamblia NOT DETECTED NOT DETECTED Final   Adenovirus F40/41 NOT DETECTED NOT DETECTED Final   Astrovirus NOT DETECTED NOT DETECTED Final   Norovirus GI/GII NOT DETECTED NOT DETECTED Final   Rotavirus A NOT DETECTED NOT DETECTED Final   Sapovirus (I, II, IV, and V) NOT DETECTED NOT DETECTED Final    Comment: Performed at [facility], [address] [place], [place] [address]    Labs: CBC: Recent Labs  Lab 03/13/22 0805 03/13/22 0827 03/13/22 0914 03/13/22 2146 03/13/22 2158  WBC  --   --  5.2 7.1  --   NEUTROABS  --   --  3.6 5.6  --   HGB 14.3 14.3 11.5* 12.0* 14.3  HCT 42.0 42.0 36.3* 38.2* 42.0  MCV  --   --  81.8 82.2  --   PLT  --   --  169 146*  --    Basic Metabolic Panel: Recent Labs  Lab 03/13/22 0800 03/13/22 0805 03/13/22 0827 03/13/22 2146 03/13/22 2158  NA 133* 131* 131* 134* 134*  K 5.6* 5.5* 5.5* 4.1 4.1  CL 93* 96*  --  93*  --   CO2 24  --   --  22  --   GLUCOSE 70 76  --  54*  --   BUN 44* 62*  --  36*  --   CREATININE 6.31* 6.80*  --  5.36*  --   CALCIUM 8.6*  --   --  9.0  --   MG 2.7*  --   --   --   --   PHOS 5.6*  --   --  5.5*  --    Liver Function Tests: Recent Labs  Lab 03/13/22 2146  AST 35  ALT 21  ALKPHOS 136*  BILITOT 2.4*  PROT 8.5*  ALBUMIN 3.1*  3.1*   CBG: Recent Labs  Lab 03/14/22 1214  GLUCAP 75    Discharge time spent: approximately 40 minutes spent on discharge counseling, evaluation of patient on day of discharge, and coordination of discharge planning with nursing, social  work, pharmacy and case management  Signed: Edwin Dada, MD [facility] 03/15/2022

## 2022-03-22 ENCOUNTER — Encounter: Payer: Medicare Other | Admitting: Vascular Surgery

## 2022-03-29 ENCOUNTER — Telehealth: Payer: Self-pay

## 2022-03-29 NOTE — Telephone Encounter (Signed)
NOTES SCANNED TO REFERRAL 

## 2022-04-04 ENCOUNTER — Telehealth: Payer: Self-pay

## 2022-04-04 NOTE — Telephone Encounter (Signed)
NOTES SCANNED TO REFERRAL 

## 2022-04-06 ENCOUNTER — Emergency Department (HOSPITAL_COMMUNITY): Payer: Medicare Other

## 2022-04-06 ENCOUNTER — Inpatient Hospital Stay (HOSPITAL_COMMUNITY)
Admission: EM | Admit: 2022-04-06 | Discharge: 2022-04-09 | DRG: 077 | Disposition: A | Discharge status: Home / Self Care | Payer: Medicare Other | Attending: Family Medicine | Admitting: Family Medicine

## 2022-04-06 ENCOUNTER — Encounter (HOSPITAL_COMMUNITY): Payer: Self-pay

## 2022-04-06 ENCOUNTER — Observation Stay (HOSPITAL_COMMUNITY): Payer: Medicare Other

## 2022-04-06 DIAGNOSIS — F17213 Nicotine dependence, cigarettes, with withdrawal: Secondary | ICD-10-CM | POA: Diagnosis present

## 2022-04-06 DIAGNOSIS — Z8249 Family history of ischemic heart disease and other diseases of the circulatory system: Secondary | ICD-10-CM | POA: Diagnosis not present

## 2022-04-06 DIAGNOSIS — Z85038 Personal history of other malignant neoplasm of large intestine: Secondary | ICD-10-CM | POA: Diagnosis not present

## 2022-04-06 DIAGNOSIS — J811 Chronic pulmonary edema: Secondary | ICD-10-CM

## 2022-04-06 DIAGNOSIS — E871 Hypo-osmolality and hyponatremia: Secondary | ICD-10-CM | POA: Diagnosis present

## 2022-04-06 DIAGNOSIS — L308 Other specified dermatitis: Secondary | ICD-10-CM | POA: Diagnosis present

## 2022-04-06 DIAGNOSIS — I674 Hypertensive encephalopathy: Principal | ICD-10-CM | POA: Diagnosis present

## 2022-04-06 DIAGNOSIS — J9601 Acute respiratory failure with hypoxia: Secondary | ICD-10-CM | POA: Diagnosis present

## 2022-04-06 DIAGNOSIS — I132 Hypertensive heart and chronic kidney disease with heart failure and with stage 5 chronic kidney disease, or end stage renal disease: Secondary | ICD-10-CM | POA: Diagnosis present

## 2022-04-06 DIAGNOSIS — I5033 Acute on chronic diastolic (congestive) heart failure: Secondary | ICD-10-CM | POA: Diagnosis present

## 2022-04-06 DIAGNOSIS — D631 Anemia in chronic kidney disease: Secondary | ICD-10-CM | POA: Diagnosis present

## 2022-04-06 DIAGNOSIS — Z833 Family history of diabetes mellitus: Secondary | ICD-10-CM | POA: Diagnosis not present

## 2022-04-06 DIAGNOSIS — Z91158 Patient's noncompliance with renal dialysis for other reason: Secondary | ICD-10-CM

## 2022-04-06 DIAGNOSIS — I248 Other forms of acute ischemic heart disease: Secondary | ICD-10-CM | POA: Diagnosis present

## 2022-04-06 DIAGNOSIS — M898X9 Other specified disorders of bone, unspecified site: Secondary | ICD-10-CM | POA: Diagnosis present

## 2022-04-06 DIAGNOSIS — N186 End stage renal disease: Secondary | ICD-10-CM | POA: Diagnosis present

## 2022-04-06 DIAGNOSIS — G9341 Metabolic encephalopathy: Secondary | ICD-10-CM

## 2022-04-06 DIAGNOSIS — Z20822 Contact with and (suspected) exposure to covid-19: Secondary | ICD-10-CM | POA: Diagnosis present

## 2022-04-06 DIAGNOSIS — E875 Hyperkalemia: Secondary | ICD-10-CM | POA: Diagnosis present

## 2022-04-06 DIAGNOSIS — J441 Chronic obstructive pulmonary disease with (acute) exacerbation: Secondary | ICD-10-CM | POA: Diagnosis present

## 2022-04-06 DIAGNOSIS — Z79899 Other long term (current) drug therapy: Secondary | ICD-10-CM

## 2022-04-06 DIAGNOSIS — I480 Paroxysmal atrial fibrillation: Secondary | ICD-10-CM | POA: Diagnosis present

## 2022-04-06 DIAGNOSIS — I161 Hypertensive emergency: Secondary | ICD-10-CM | POA: Diagnosis present

## 2022-04-06 DIAGNOSIS — I169 Hypertensive crisis, unspecified: Secondary | ICD-10-CM | POA: Diagnosis not present

## 2022-04-06 DIAGNOSIS — Z992 Dependence on renal dialysis: Secondary | ICD-10-CM

## 2022-04-06 DIAGNOSIS — E872 Acidosis, unspecified: Secondary | ICD-10-CM | POA: Diagnosis present

## 2022-04-06 DIAGNOSIS — Z841 Family history of disorders of kidney and ureter: Secondary | ICD-10-CM

## 2022-04-06 DIAGNOSIS — N2581 Secondary hyperparathyroidism of renal origin: Secondary | ICD-10-CM | POA: Diagnosis present

## 2022-04-06 DIAGNOSIS — I10 Essential (primary) hypertension: Secondary | ICD-10-CM | POA: Diagnosis present

## 2022-04-06 DIAGNOSIS — I160 Hypertensive urgency: Secondary | ICD-10-CM

## 2022-04-06 DIAGNOSIS — R0603 Acute respiratory distress: Principal | ICD-10-CM

## 2022-04-06 HISTORY — DX: Metabolic encephalopathy: G93.41

## 2022-04-06 LAB — CBC WITH DIFFERENTIAL/PLATELET
Abs Immature Granulocytes: 0.02 10*3/uL (ref 0.00–0.07)
Basophils Absolute: 0 10*3/uL (ref 0.0–0.1)
Basophils Relative: 0 %
Eosinophils Absolute: 0 10*3/uL (ref 0.0–0.5)
Eosinophils Relative: 0 %
HCT: 39.3 % (ref 39.0–52.0)
Hemoglobin: 12.1 g/dL — ABNORMAL LOW (ref 13.0–17.0)
Immature Granulocytes: 0 %
Lymphocytes Relative: 12 %
Lymphs Abs: 0.6 10*3/uL — ABNORMAL LOW (ref 0.7–4.0)
MCH: 26.1 pg (ref 26.0–34.0)
MCHC: 30.8 g/dL (ref 30.0–36.0)
MCV: 84.9 fL (ref 80.0–100.0)
Monocytes Absolute: 0.9 10*3/uL (ref 0.1–1.0)
Monocytes Relative: 19 %
Neutro Abs: 3.2 10*3/uL (ref 1.7–7.7)
Neutrophils Relative %: 69 %
Platelets: 161 10*3/uL (ref 150–400)
RBC: 4.63 MIL/uL (ref 4.22–5.81)
RDW: 22.2 % — ABNORMAL HIGH (ref 11.5–15.5)
WBC: 4.8 10*3/uL (ref 4.0–10.5)
nRBC: 0 % (ref 0.0–0.2)

## 2022-04-06 LAB — I-STAT ARTERIAL BLOOD GAS, ED
Acid-Base Excess: 6 mmol/L — ABNORMAL HIGH (ref 0.0–2.0)
Bicarbonate: 30.7 mmol/L — ABNORMAL HIGH (ref 20.0–28.0)
Calcium, Ion: 1.08 mmol/L — ABNORMAL LOW (ref 1.15–1.40)
HCT: 41 % (ref 39.0–52.0)
Hemoglobin: 13.9 g/dL (ref 13.0–17.0)
O2 Saturation: 96 %
Patient temperature: 98.8
Potassium: 4.5 mmol/L (ref 3.5–5.1)
Sodium: 132 mmol/L — ABNORMAL LOW (ref 135–145)
TCO2: 32 mmol/L (ref 22–32)
pCO2 arterial: 46.1 mmHg (ref 32–48)
pH, Arterial: 7.433 (ref 7.35–7.45)
pO2, Arterial: 82 mmHg — ABNORMAL LOW (ref 83–108)

## 2022-04-06 LAB — I-STAT VENOUS BLOOD GAS, ED
Acid-Base Excess: 9 mmol/L — ABNORMAL HIGH (ref 0.0–2.0)
Bicarbonate: 31.2 mmol/L — ABNORMAL HIGH (ref 20.0–28.0)
Calcium, Ion: 0.79 mmol/L — CL (ref 1.15–1.40)
HCT: 40 % (ref 39.0–52.0)
Hemoglobin: 13.6 g/dL (ref 13.0–17.0)
O2 Saturation: 99 %
Potassium: 6.1 mmol/L — ABNORMAL HIGH (ref 3.5–5.1)
Sodium: 131 mmol/L — ABNORMAL LOW (ref 135–145)
TCO2: 32 mmol/L (ref 22–32)
pCO2, Ven: 35.2 mmHg — ABNORMAL LOW (ref 44–60)
pH, Ven: 7.555 — ABNORMAL HIGH (ref 7.25–7.43)
pO2, Ven: 141 mmHg — ABNORMAL HIGH (ref 32–45)

## 2022-04-06 LAB — COMPREHENSIVE METABOLIC PANEL
ALT: 16 U/L (ref 0–44)
AST: 28 U/L (ref 15–41)
Albumin: 3.1 g/dL — ABNORMAL LOW (ref 3.5–5.0)
Alkaline Phosphatase: 110 U/L (ref 38–126)
Anion gap: 16 — ABNORMAL HIGH (ref 5–15)
BUN: 44 mg/dL — ABNORMAL HIGH (ref 8–23)
CO2: 25 mmol/L (ref 22–32)
Calcium: 8.2 mg/dL — ABNORMAL LOW (ref 8.9–10.3)
Chloride: 93 mmol/L — ABNORMAL LOW (ref 98–111)
Creatinine, Ser: 6.18 mg/dL — ABNORMAL HIGH (ref 0.61–1.24)
GFR, Estimated: 10 mL/min — ABNORMAL LOW (ref >60)
Glucose, Bld: 82 mg/dL (ref 70–99)
Potassium: 5.9 mmol/L — ABNORMAL HIGH (ref 3.5–5.1)
Sodium: 134 mmol/L — ABNORMAL LOW (ref 135–145)
Total Bilirubin: 1.5 mg/dL — ABNORMAL HIGH (ref 0.3–1.2)
Total Protein: 7.8 g/dL (ref 6.5–8.1)

## 2022-04-06 LAB — CBG MONITORING, ED: Glucose-Capillary: 82 mg/dL (ref 70–99)

## 2022-04-06 LAB — GLUCOSE, CAPILLARY: Glucose-Capillary: 103 mg/dL — ABNORMAL HIGH (ref 70–99)

## 2022-04-06 LAB — RESP PANEL BY RT-PCR (FLU A&B, COVID) ARPGX2
Influenza A by PCR: NEGATIVE
Influenza B by PCR: NEGATIVE
SARS Coronavirus 2 by RT PCR: NEGATIVE

## 2022-04-06 LAB — BRAIN NATRIURETIC PEPTIDE: B Natriuretic Peptide: >4500 pg/mL — ABNORMAL HIGH (ref 0.0–100.0)

## 2022-04-06 LAB — MAGNESIUM: Magnesium: 2.8 mg/dL — ABNORMAL HIGH (ref 1.7–2.4)

## 2022-04-06 LAB — PROTIME-INR
INR: 1.3 — ABNORMAL HIGH (ref 0.8–1.2)
Prothrombin Time: 15.8 seconds — ABNORMAL HIGH (ref 11.4–15.2)

## 2022-04-06 LAB — TROPONIN I (HIGH SENSITIVITY)
Troponin I (High Sensitivity): 88 ng/L — ABNORMAL HIGH (ref <18)
Troponin I (High Sensitivity): 109 ng/L (ref <18)

## 2022-04-06 LAB — AMMONIA: Ammonia: 34 umol/L (ref 9–35)

## 2022-04-06 MED ORDER — NITROGLYCERIN 2 % TD OINT
0.5000 [in_us] | TOPICAL_OINTMENT | Freq: Once | TRANSDERMAL | Status: AC
  Administered 2022-04-06: 0.5 [in_us] via TOPICAL
  Filled 2022-04-06: qty 1

## 2022-04-06 MED ORDER — HEPARIN SODIUM (PORCINE) 5000 UNIT/ML IJ SOLN
5000.0000 [IU] | Freq: Three times a day (TID) | INTRAMUSCULAR | Status: DC
  Administered 2022-04-07 – 2022-04-09 (×7): 5000 [IU] via SUBCUTANEOUS
  Filled 2022-04-06 (×8): qty 1

## 2022-04-06 MED ORDER — IPRATROPIUM-ALBUTEROL 0.5-2.5 (3) MG/3ML IN SOLN
3.0000 mL | Freq: Once | RESPIRATORY_TRACT | Status: AC
  Administered 2022-04-06: 3 mL via RESPIRATORY_TRACT
  Filled 2022-04-06: qty 3

## 2022-04-06 MED ORDER — CALCIUM GLUCONATE-NACL 1-0.675 GM/50ML-% IV SOLN
1.0000 g | Freq: Once | INTRAVENOUS | Status: AC
Start: 2022-04-06 — End: 2022-04-06
  Administered 2022-04-06: 1000 mg via INTRAVENOUS
  Filled 2022-04-06: qty 50

## 2022-04-06 MED ORDER — POLYETHYLENE GLYCOL 3350 17 G PO PACK: 17.0000 g | PACK | Freq: Every day | ORAL | Status: DC | PRN

## 2022-04-06 MED ORDER — CHLORHEXIDINE GLUCONATE CLOTH 2 % EX PADS
6.0000 | MEDICATED_PAD | Freq: Every day | CUTANEOUS | Status: DC
  Administered 2022-04-06 – 2022-04-09 (×3): 6 via TOPICAL

## 2022-04-06 MED ORDER — DOCUSATE SODIUM 100 MG PO CAPS: 100.0000 mg | ORAL_CAPSULE | Freq: Two times a day (BID) | ORAL | Status: DC | PRN

## 2022-04-06 MED ORDER — LABETALOL HCL 5 MG/ML IV SOLN
10.0000 mg | INTRAVENOUS | Status: DC | PRN
  Administered 2022-04-06: 10 mg via INTRAVENOUS
  Administered 2022-04-07: 20 mg via INTRAVENOUS
  Filled 2022-04-06 (×2): qty 4

## 2022-04-06 MED ORDER — HYDRALAZINE HCL 20 MG/ML IJ SOLN
10.0000 mg | INTRAMUSCULAR | Status: DC | PRN
  Administered 2022-04-06: 20 mg via INTRAVENOUS
  Filled 2022-04-06: qty 1
  Filled 2022-04-06: qty 2
  Filled 2022-04-06: qty 1

## 2022-04-06 NOTE — ED Notes (Signed)
Pt placed on CPAP by RT at this time. O2 100%, patient breathing remains labored and shallow despite CPAP. MD aware.

## 2022-04-06 NOTE — Subjective & Objective (Signed)
Went  to HD went home stopped too early Arrived on CPAP Has chronic ascites Reports SOB Plan for HD today Denies any CP no back PAIN

## 2022-04-06 NOTE — Assessment & Plan Note (Signed)
Not on anticoagulation. Continue Coreg 6.25 mg BID  and diltiazem 360 mg PO daily  when able to restart

## 2022-04-06 NOTE — ED Triage Notes (Addendum)
PT BIB [facility] from home in Resp distress, EMS reports lung sounds fluid through out, wheezing. '5mg'$  albuterol, 0.5 atrovent, 82% on RA, 100% on CPAP. Did not finish dialysis today d/t cramping and was SOB at dialysis. Pt currently on CPAP. Answering questions at this time.

## 2022-04-06 NOTE — ED Provider Notes (Signed)
[facility] EMERGENCY DEPARTMENT Provider Note   CSN: [id number] Arrival date & time: 04/06/22  1710     History  Chief Complaint  Patient presents with   Respiratory Distress    Timothy [name] is a 61 y.o. male.  HPI  61 year old male with past medical history of ESRD on HD, CHF, HTN, chronic anemia, polysubstance abuse, hepatitis C with ascites presents to the emergency department in respiratory distress.  Patient arrives via EMS on BiPAP.  Level 5 caveat due to acuity/BiPAP.  Patient reportedly went to dialysis today, did not receive a full session secondary to cramping and shortness of breath.  Requested to go home and then called EMS.  He is denying any chest or back pain.  He has chronic swelling of his abdomen and legs which he notes is unchanged.  Denies any fever.  Patient placed on CPAP with EMS for hypoxia, oxygen levels in the 80s on room air.  No history of any supplemental oxygen requirement.  Patient was given albuterol, Solu-Medrol and Atrovent in route with slight improvement.  Home Medications Prior to Admission medications   Medication Sig Start Date End Date Taking? Authorizing Provider  albuterol (VENTOLIN HFA) 108 (90 Base) MCG/ACT inhaler Inhale 2 puffs into the lungs every 6 (six) hours as needed for wheezing or shortness of breath. 01/06/22   [provider]  carvedilol (COREG) 6.25 MG tablet Take 1 tablet (6.25 mg total) by mouth 2 (two) times daily with a meal. 01/01/22   Amponsah, Charisse March, MD  diltiazem (TIAZAC) 360 MG 24 hr capsule Take 360 mg by mouth daily.    [provider]  DULoxetine (CYMBALTA) 20 MG capsule Take 20 mg by mouth daily. 01/06/22   [provider]  hydrALAZINE (APRESOLINE) 25 MG tablet Take 1 tablet (25 mg total) by mouth every 6 (six) hours. 03/15/22   Danford, Suann Larry, MD  LIDOCAINE-PRILOCAINE EX Apply 1 application. topically Every Tuesday,Thursday,and Saturday with dialysis. Patient  not taking: Reported on 03/14/2022    [provider]  multivitamin (RENA-VIT) TABS tablet Take 1 tablet by mouth at bedtime. 03/15/22   Danford, Suann Larry, MD  oxyCODONE (OXY IR/ROXICODONE) 5 MG immediate release tablet Take 5 mg by mouth every 8 (eight) hours as needed for pain. 12/09/21   [provider]  sevelamer carbonate (RENVELA) 800 MG tablet Take 800 mg by mouth 2 (two) times daily with a meal. 11/14/21   [provider]      Allergies    Patient has no known allergies.    Review of Systems   Review of Systems  Unable to perform ROS: Acuity of condition    Physical Exam Updated Vital Signs BP (!) 187/121   Pulse 88   Resp (!) 21   SpO2 96%  Physical Exam Vitals and nursing note reviewed.  Constitutional:      General: He is in acute distress.     Appearance: Normal appearance. He is ill-appearing.  HENT:     Head: Normocephalic.     Mouth/Throat:     Mouth: Mucous membranes are moist.  Cardiovascular:     Rate and Rhythm: Normal rate.  Pulmonary:     Effort: Respiratory distress present.     Breath sounds: Wheezing and rales present.  Abdominal:     General: There is distension.     Tenderness: There is no guarding.  Musculoskeletal:        General: Swelling present.  Skin:  General: Skin is warm.  Neurological:     Mental Status: He is alert.     ED Results / Procedures / Treatments   Labs (all labs ordered are listed, but only abnormal results are displayed) Labs Reviewed  CBC WITH DIFFERENTIAL/PLATELET - Abnormal; Notable for the following components:      Result Value   Hemoglobin 12.1 (*)    RDW 22.2 (*)    Lymphs Abs 0.6 (*)    All other components within normal limits  COMPREHENSIVE METABOLIC PANEL - Abnormal; Notable for the following components:   Sodium 134 (*)    Potassium 5.9 (*)    Chloride 93 (*)    BUN 44 (*)    Creatinine, Ser 6.18 (*)    Calcium 8.2 (*)    Albumin 3.1 (*)    Total Bilirubin 1.5 (*)     GFR, Estimated 10 (*)    Anion gap 16 (*)    All other components within normal limits  BRAIN NATRIURETIC PEPTIDE - Abnormal; Notable for the following components:   B Natriuretic Peptide >4,500.0 (*)    All other components within normal limits  MAGNESIUM - Abnormal; Notable for the following components:   Magnesium 2.8 (*)    All other components within normal limits  PROTIME-INR - Abnormal; Notable for the following components:   Prothrombin Time 15.8 (*)    INR 1.3 (*)    All other components within normal limits  I-STAT VENOUS BLOOD GAS, ED - Abnormal; Notable for the following components:   pH, Ven 7.555 (*)    pCO2, Ven 35.2 (*)    pO2, Ven 141 (*)    Bicarbonate 31.2 (*)    Acid-Base Excess 9.0 (*)    Sodium 131 (*)    Potassium 6.1 (*)    Calcium, Ion 0.79 (*)    All other components within normal limits  TROPONIN I (HIGH SENSITIVITY) - Abnormal; Notable for the following components:   Troponin I (High Sensitivity) 88 (*)    All other components within normal limits  RESP PANEL BY RT-PCR (FLU A&B, COVID) [id number]  CBG MONITORING, ED    EKG EKG Interpretation  Date/Time:  Thursday April 06 2022 17:17:25 EDT Ventricular Rate:  86 PR Interval:  200 QRS Duration: 109 QT Interval:  362 QTC Calculation: 433 R Axis:   -24 Text Interpretation: Sinus rhythm Borderline left axis deviation Abnormal T, consider ischemia, lateral leads Similar to previous Confirmed by Lavenia Atlas [phone]) on 04/06/2022 5:55:29 PM  Radiology DG Chest Port 1 View  Result Date: 04/06/2022 CLINICAL DATA:  Shortness of breath EXAM: PORTABLE CHEST 1 VIEW COMPARISON:  Portable exam 1739 hours compared to 03/13/2022 FINDINGS: Enlargement of cardiac silhouette with pulmonary vascular congestion. Atherosclerotic calcification aorta. Decreased subsegmental atelectasis RIGHT base. No definite infiltrate, pleural effusion, or pneumothorax. Chronic elevation of RIGHT diaphragm. Osseous structures  unremarkable. IMPRESSION: Enlargement of cardiac silhouette with pulmonary vascular congestion. Chronic elevation of RIGHT diaphragm with slightly decreased RIGHT basilar atelectasis. Aortic Atherosclerosis (ICD10-I70.0). Electronically Signed   By: Lavonia Dana M.D.   On: 04/06/2022 17:55    Procedures .Critical Care  Performed by: Lorelle Gibbs, DO Authorized by: Lorelle Gibbs, DO   Critical care provider statement:    Critical care time (minutes):  60   Critical care time was exclusive of:  Separately billable procedures and treating other patients   Critical care was necessary to treat or prevent imminent or life-threatening deterioration of the following conditions:  Metabolic crisis, respiratory failure and CNS failure or compromise   Critical care was time spent personally by me on the following activities:  Development of treatment plan with patient or surrogate, discussions with consultants, evaluation of patient's response to treatment, examination of patient, ordering and review of laboratory studies, ordering and review of radiographic studies, ordering and performing treatments and interventions, pulse oximetry, re-evaluation of patient's condition and review of old charts   I assumed direction of critical care for this patient from another provider in my specialty: no     Care discussed with: admitting provider       Medications Ordered in ED Medications  ipratropium-albuterol (DUONEB) 0.5-2.5 (3) MG/3ML nebulizer solution 3 mL (has no administration in time range)  nitroGLYCERIN (NITROGLYN) 2 % ointment 0.5 inch (0.5 inches Topical Given 04/06/22 1814)    ED Course/ Medical Decision Making/ A&P                           Medical Decision Making Amount and/or Complexity of Data Reviewed Labs: ordered. Radiology: ordered.  Risk Prescription drug management. Decision regarding hospitalization.   61 year old male presents emergency room respiratory distress.  Level  5 caveat secondary to acuity, BiPAP.  Currently awake, rude at times but appropriate and appears to be baseline.  Full code.  He has diffuse Rales bilaterally, chronic abdominal swelling/lower extremity swelling.  Work-up is consistent with fluid overload state.  Chest x-ray shows cardiomegaly with bilateral pulmonary congestion.  Mild hyperkalemia, no EKG changes, mild hemolysis.  Patient refused additional IV placement and sticks for ABG.  VBG shows alkalosis and hypocarbia.  Nephrology, Dr. Osborne Casco consulted and will plan for dialysis tonight.  Patient initially admitted to medicine however mental status began to decline.  Still arousable and tolerating BiPAP.  Ammonia is normal, ABG showed no hypercarbia.  Head CT unremarkable.  Consulted with ICU given the mental status decline, potential for intubation.  They have evaluated the patient and will admit to the ICU service.  Patients evaluation and results requires admission for further treatment and care.  Spoke with ICU Dr. Elsworth Soho, reviewed patient's ED course and they accept admission.  Patient agrees with admission plan, offers no new complaints and is stable/unchanged at time of admit.        Final Clinical Impression(s) / ED Diagnoses Final diagnoses:  None    Rx / DC Orders ED Discharge Orders     None         Lorelle Gibbs, DO 04/06/22 2319

## 2022-04-06 NOTE — Progress Notes (Signed)
Pt transported by RN and RT from [place] to CT1 and back w/ no complications

## 2022-04-06 NOTE — Assessment & Plan Note (Signed)
On BIPAP increased work  VBG no hypercarbia    this patient has acute respiratory failure with Hypoxia  as documented by the presence of following: O2 saturatio< 90% on RA  Likely due to: fluid overload Provide O2 therapy and titrate as needed  Continuous pulse ox   check Pulse ox with ambulation prior to discharge

## 2022-04-06 NOTE — ED Notes (Signed)
Admitting provider allowing permissive hypertension for better tolerance of dialysis treatment.

## 2022-04-06 NOTE — ED Notes (Signed)
When swabbing patients nose, he continued to sleep during the process. He woke with painful stimuli briefly but he is experiencing decreased LOC.

## 2022-04-06 NOTE — ED Notes (Signed)
NS alerted RN that patient had removed his CPAP mask. Patient reports that he could not breathe. RN returned mask to face and patient is comfortable at this time.

## 2022-04-06 NOTE — H&P (Signed)
NAME:  Timothy [name], MRN:  [id number], DOB:  [birth year], LOS: 0 ADMISSION DATE:  04/06/2022, CONSULTATION DATE:  04/06/2022  REFERRING MD:  Horton, EDP , CHIEF COMPLAINT: Respiratory distress, somnolence  History of Present Illness:  61 year old man with ESRD on HD , BIBEMS with respiratory distress and hypoxia to 80s, placed on BiPAP.  Given Solu-Medrol, albuterol and Atrovent on route.  He went to dialysis today but did not complete full session due to cramping and requested to go home.  Initially alert and able to provide history, refused IV and refused to answer questions.  Initial labs showed potassium 5.9 creatinine 6.2, BNP more than 4500, no leukocytosis seen by renal and plan for emergent HD tonight Blood pressure noted to be high 1 98-3 90 systolic and MAP 1 38-2 40 range. Became progressively somnolent. ABG 7.4 3/46/82 on 40% BiPAP Ammonia 34. Head CT negative except for left frontal contusion over scalp  PCCM asked to admit   Pertinent  Medical History  ESRD on HD TTS Hypertension Substance abuse -THC, opiates Duodenal ulcer with bleeding status post surgical repair Anemia of chronic disease Colon cancer Hep C with chronic ascites  Significant Hospital Events: Including procedures, antibiotic start and stop dates in addition to other pertinent events     Interim History / Subjective:  Afebrile, hypertensive  Objective   Blood pressure (!) 186/113, pulse 90, temperature 98.8 F (37.1 C), temperature source Other (Comment), resp. rate 19, SpO2 94 %.    FiO2 (%):  [30 %] 30 %  No intake or output data in the 24 hours ending 04/06/22 2235 There were no vitals filed for this visit.  Examination: General: Disheveled man lying on his left side in ED stretcher on BiPAP, full facemask, no distress , not cooperative with exam HENT: Mild pallor, icterus + , muddy sclera Lungs: Crackles right base, no rhonchi, no accessory muscle use Cardiovascular: S1-S2 regular, ESM  2/6 at base Abdomen: Soft, nontender, no guarding Extremities: Diffuse scaling, dry, no deformity Neuro: Opens eyes to name, noxious stimulus and stares angrily, does not follow command, does not cooperate   Chest x-ray independently reviewed shows chronically elevated right diaphragm, cardiomegaly, no effusions  Resolved Hospital Problem list     Assessment & Plan:  Acute metabolic encephalopathy -Unclear etiology, ammonia /head CT/PCO2 normal -Unable to obtain UDS, previously has been positive for THC -Mental status has worsened after arrival-so differentials of PR ES, hypertensive encephalopathy less likely   -Supportive care for now -Plan for emergent dialysis, control BP -If worsens will give Narcan and check MRI for PR ES  Hypertensive urgency -We will use hydralazine and labetalol as needed  Acute pulmonary edema/fluid overload ESRD-inadequate HD last 2 sessions Hyperkalemia  -Plan for emergent HD tonight   Best Practice (right click and "Reselect all SmartList Selections" daily)   Diet/type: NPO w/ oral meds DVT prophylaxis: prophylactic heparin  GI prophylaxis: N/A Lines: N/A Foley:  N/A Code Status:  full code Last date of multidisciplinary goals of care discussion [NA]  Labs   CBC: Recent Labs  Lab 04/06/22 1720 04/06/22 1749 04/06/22 2134  WBC 4.8  --   --   NEUTROABS 3.2  --   --   HGB 12.1* 13.6 13.9  HCT 39.3 40.0 41.0  MCV 84.9  --   --   PLT 161  --   --     Basic Metabolic Panel: Recent Labs  Lab 04/06/22 1720 04/06/22 1749 04/06/22 2134  NA 134* 131*  132*  K 5.9* 6.1* 4.5  CL 93*  --   --   CO2 25  --   --   GLUCOSE 82  --   --   BUN 44*  --   --   CREATININE 6.18*  --   --   CALCIUM 8.2*  --   --   MG 2.8*  --   --    GFR: CrCl cannot be calculated (Unknown ideal weight.). Recent Labs  Lab 04/06/22 1720  WBC 4.8    Liver Function Tests: Recent Labs  Lab 04/06/22 1720  AST 28  ALT 16  ALKPHOS 110  BILITOT 1.5*   PROT 7.8  ALBUMIN 3.1*   No results for input(s): "LIPASE", "AMYLASE" in the last 168 hours. Recent Labs  Lab 04/06/22 2139  AMMONIA 34    ABG    Component Value Date/Time   PHART 7.433 04/06/2022 2134   PCO2ART 46.1 04/06/2022 2134   PO2ART 82 (L) 04/06/2022 2134   HCO3 30.7 (H) 04/06/2022 2134   TCO2 32 04/06/2022 2134   ACIDBASEDEF 4.0 (H) 09/09/2016 0931   O2SAT 96 04/06/2022 2134     Coagulation Profile: Recent Labs  Lab 04/06/22 1720  INR 1.3*    Cardiac Enzymes: No results for input(s): "CKTOTAL", "CKMB", "CKMBINDEX", "TROPONINI" in the last 168 hours.  HbA1C: No results found for: "HGBA1C"  CBG: Recent Labs  Lab 04/06/22 1719  GLUCAP 82    Review of Systems:   Unable to obtain since poor mental status/uncooperative  Past Medical History:  He,  has a past medical history of Anemia of chronic kidney failure, BPH (benign prostatic hyperplasia), Colon cancer ([place]) (2014), End stage renal disease on dialysis ([place]) (2017), Hepatitis C, Hypertension, Hypertensive heart disease with chronic diastolic congestive heart failure ([place]) (07/17/2016), and Polysubstance abuse ([place]).   Surgical History:   Past Surgical History:  Procedure Laterality Date   A/V FISTULAGRAM Left 12/02/2021   Procedure: A/V Fistulagram;  Surgeon: Cherre Robins, MD;  Location: [place] [facility];  Service: Cardiovascular;  Laterality: Left;   ABDOMINAL SURGERY     AV FISTULA PLACEMENT Left 09/19/2016   Procedure: Left arm Radiocephalic ARTERIOVENOUS (AV) FISTULA CREATION;  Surgeon: Conrad Rossford, MD;  Location: [place];  Service: Vascular;  Laterality: Left;   [place] Left 07/09/2017   Procedure: BRACHIOCEPHALIC FISTULA CREATION;  Surgeon: Conrad [place], MD;  Location: [place];  Service: Vascular;  Laterality: Left;   COLON SURGERY  2014   ESOPHAGOGASTRODUODENOSCOPY (EGD) WITH PROPOFOL N/A 11/05/2021   Procedure: ESOPHAGOGASTRODUODENOSCOPY (EGD) WITH PROPOFOL;  Surgeon:  Carol Ada, MD;  Location: [place];  Service: Endoscopy;  Laterality: N/A;   ESOPHAGOGASTRODUODENOSCOPY (EGD) WITH PROPOFOL N/A 11/14/2021   Procedure: ESOPHAGOGASTRODUODENOSCOPY (EGD) WITH PROPOFOL;  Surgeon: [name], MD;  Location: [place];  Service: Gastroenterology;  Laterality: N/A;   ESOPHAGOGASTRODUODENOSCOPY (EGD) WITH PROPOFOL N/A 11/16/2021   Procedure: ESOPHAGOGASTRODUODENOSCOPY (EGD) WITH PROPOFOL;  Surgeon: [name], MD;  Location: [place];  Service: Gastroenterology;  Laterality: N/A;   HEMOSTASIS CLIP PLACEMENT  11/16/2021   Procedure: HEMOSTASIS CLIP PLACEMENT;  Surgeon: [name], MD;  Location: [facility] ENDOSCOPY;  Service: Gastroenterology;;   HEMOSTASIS CONTROL  11/05/2021   Procedure: HEMOSTASIS CONTROL;  Surgeon: Carol Ada, MD;  Location: [place];  Service: Endoscopy;;   HEMOSTASIS CONTROL  11/16/2021   Procedure: HEMOSTASIS CONTROL;  Surgeon: [name], MD;  Location: [place];  Service: Gastroenterology;;   HOT HEMOSTASIS  N/A 11/16/2021   Procedure: HOT HEMOSTASIS (ARGON PLASMA COAGULATION/BICAP);  Surgeon: [name], MD;  Location: [place];  Service: Gastroenterology;  Laterality: N/A;   INSERTION OF DIALYSIS CATHETER N/A 09/19/2016   Procedure: INSERTION OF TUNNELED DIALYSIS CATHETER;  Surgeon: Conrad Hanover, MD;  Location: [place];  Service: Vascular;  Laterality: N/A;   IR ANGIOGRAM SELECTIVE EACH ADDITIONAL VESSEL  11/16/2021   IR ANGIOGRAM VISCERAL SELECTIVE  11/05/2021   IR ANGIOGRAM VISCERAL SELECTIVE  11/16/2021   IR ANGIOGRAM VISCERAL SELECTIVE  11/16/2021   IR EMBO ART  VEN HEMORR LYMPH EXTRAV  INC GUIDE ROADMAPPING  11/05/2021   IR EMBO ART  VEN HEMORR LYMPH EXTRAV  INC GUIDE ROADMAPPING  11/16/2021   IR GENERIC HISTORICAL  09/14/2016   IR US GUIDE VASC ACCESS RIGHT 09/14/2016 [name], DO [facility]   IR GENERIC HISTORICAL  09/14/2016   IR FLUORO GUIDE CV LINE RIGHT 09/14/2016 [name], DO [facility]   IR  PARACENTESIS  06/22/2021   IR US GUIDE [place] RIGHT  11/05/2021   IR US GUIDE [place] RIGHT  11/16/2021   LAPAROTOMY N/A 05/23/2021   Procedure: EXPLORATORY LAPAROTOMY LYSIS ADHESIONS;  Surgeon: [name], MD;  Location: [place];  Service: General;  Laterality: N/A;   LAPAROTOMY N/A 11/20/2021   Procedure: EXPLORATORY LAPAROTOMY, REPAIR OF BLEEDING DUODENAL ULCER;  Surgeon: Dwan Bolt, MD;  Location: [place];  Service: General;  Laterality: N/A;   ORIF TIBIA PLATEAU Left 01/15/2018   Procedure: OPEN REDUCTION INTERNAL FIXATION (ORIF) TIBIAL PLATEAU;  Surgeon: Altamese , MD;  Location: [place];  Service: Orthopedics;  Laterality: Left;   SCLEROTHERAPY  11/05/2021   Procedure: Clide Deutscher;  Surgeon: Carol Ada, MD;  Location: [place];  Service: Endoscopy;;   SCLEROTHERAPY  11/16/2021   Procedure: Clide Deutscher;  Surgeon: [name], MD;  Location: [facility] ENDOSCOPY;  Service: Gastroenterology;;   TRANSTHORACIC ECHOCARDIOGRAM  07/2016    EF 60-65%, No RWMA. Mod Concentric LVH - Gr 2 DD. Severe LA dilation. PAP ~35 mmHg (mild Pulm HTN)  --> no changes noted 1 month later     Social History:   reports that he has been smoking cigarettes. He has been smoking an average of .25 packs per day. He has never used smokeless tobacco. He reports current drug use. Frequency: 1.00 time per week. Drugs: Marijuana and Heroin. He reports that he does not drink alcohol.   Family History:  His family history includes CAD in his nephew; Diabetes Mellitus II in his mother; Heart attack (age of onset: 40) in his mother; Heart failure in his mother; Hypertension in his mother; Kidney failure in his sister; Other in his father and another family member.   Allergies No Known Allergies   Home Medications  Prior to Admission medications   Medication Sig Start Date End Date Taking? Authorizing Provider  albuterol (VENTOLIN HFA) 108 (90 Base) MCG/ACT inhaler Inhale 2 puffs into the lungs every 6 (six)  hours as needed for wheezing or shortness of breath. 01/06/22   [provider]  carvedilol (COREG) 6.25 MG tablet Take 1 tablet (6.25 mg total) by mouth 2 (two) times daily with a meal. 01/01/22   Amponsah, Charisse March, MD  diltiazem (TIAZAC) 360 MG 24 hr capsule Take 360 mg by mouth daily.    [provider]  DULoxetine (CYMBALTA) 20 MG capsule Take 20 mg by mouth daily. 01/06/22   [provider]  hydrALAZINE (APRESOLINE) 25 MG tablet Take  1 tablet (25 mg total) by mouth every 6 (six) hours. 03/15/22   Danford, Suann Larry, MD  LIDOCAINE-PRILOCAINE EX Apply 1 application. topically Every Tuesday,Thursday,and Saturday with dialysis. Patient not taking: Reported on 03/14/2022    [provider]  multivitamin (RENA-VIT) TABS tablet Take 1 tablet by mouth at bedtime. 03/15/22   Danford, Suann Larry, MD  oxyCODONE (OXY IR/ROXICODONE) 5 MG immediate release tablet Take 5 mg by mouth every 8 (eight) hours as needed for pain. 12/09/21   [provider]  sevelamer carbonate (RENVELA) 800 MG tablet Take 800 mg by mouth 2 (two) times daily with a meal. 11/14/21   [provider]     Critical care time: [place] MD. FCCP. [facility] Pager : [phone]  If no response to pager , please call [phone] until 7 pm After 7:00 pm call Elink  [phone]   04/06/2022

## 2022-04-06 NOTE — ED Notes (Signed)
Pt transported to CT with RT, RN, and EMTP.

## 2022-04-06 NOTE — Progress Notes (Signed)
Pt transported from [place] to [id number] w/o complications

## 2022-04-06 NOTE — Assessment & Plan Note (Signed)
Continue HD today Nephrology  Is aware

## 2022-04-06 NOTE — Assessment & Plan Note (Signed)
Chronic stable continue to monitor 

## 2022-04-06 NOTE — Progress Notes (Signed)
Pt refusing ABG at this time. MD notified, will order VBG. RT will continue to monitor and be available.

## 2022-04-06 NOTE — H&P (Incomplete)
In    Timothy [name] DVV:[id number] DOB: 1961-09-01 DOA: 04/06/2022     PCP: [name], MD   Outpatient Specialists:     NEphrology: unsure who is the kidney doctor    Patient arrived to ER on 04/06/22 at 61 Referred by Attending Toy Baker, MD   Patient coming from:    home Lives  With family    Chief Complaint:   Chief Complaint  Patient presents with   Respiratory Distress    HPI: Timothy [name] is a 61 y.o. male with medical history significant of  ESRD on HD TH Sat, A.fib, HTN, drug abuse, chronic CHF, history of bleeding duodenal ulcer status post surgical repair, anemia of chronic disease, history of colon cancer    Presented with   Shortness of breath, increased work of breathing after cutting his HD short Went  to HD went home stopped too early Arrived on CPAP Has chronic ascites Reports SOB Plan for HD today Denies any CP no back PAIN      Initial COVID TEST  NEGATIVE**** POSITIVE,  ***in house  PCR testing  Pending  Lab Results  Component Value Date   [place] 12/30/2021   [place] NEGATIVE 11/12/2021   [place] NEGATIVE 11/04/2021   [place] NEGATIVE 10/31/2021     Regarding pertinent Chronic problems:    Hyperlipidemia -  not on statins   Lipid Panel     Component Value Date/Time   CHOL 242 (H) 07/29/2015 0943   TRIG 196 (H) 11/06/2021 0359   HDL 51 07/29/2015 0943   CHOLHDL 4.7 07/29/2015 0943   VLDL 34 (H) 07/29/2015 0943   LDLCALC 157 (H) 07/29/2015 0943    HTN on Coreg diltiazem  chronic CHF diastolic/systolic/ combined - last echo 02/14/2022 EF 50 to 55% Left ventricular diastolic parameters are  consistent with Grade II diastolic dysfunction   A. Fib -  - CHA2DS2 vas score   2   Not on anticoagulation secondary to R recurrent bleeding         -  Rate control:  Currently controlled with  Diltiazem, Coreg     ESRD on HD t H s  Lab Results  Component Value Date   CREATININE 6.18 (H)  04/06/2022   CREATININE 5.36 (H) 03/13/2022   CREATININE 6.80 (H) 03/13/2022      While in ER: Patient was given a dose of albuterol Solu-Medrol and Atrovent in route  No evidence of hypercarbia troponin 88 Still hypertensive given a nitro ointment Initially on CPAP  Noted to have potassium at 6.4 nephrology recommended given a dose of calcium holding off bicarb because patient has history of alkalosis     CXR - Enlargement of cardiac silhouette with pulmonary vascular congestion.   Chronic elevation of RIGHT diaphragm with slightly decreased RIGHT basilar atelectasis.     Following Medications were ordered in ER: Medications  calcium gluconate 1 g/ 50 mL sodium chloride IVPB (has no administration in time range)  Chlorhexidine Gluconate Cloth 2 % PADS 6 each (has no administration in time range)  nitroGLYCERIN (NITROGLYN) 2 % ointment 0.5 inch (0.5 inches Topical Given 04/06/22 1814)  ipratropium-albuterol (DUONEB) 0.5-2.5 (3) MG/3ML nebulizer solution 3 mL (3 mLs Nebulization Given 04/06/22 1941)    _______________________________________________________ ER Provider Called: Nephrology   Dr. Osborne Casco They Recommend admit to medicine  Will see once goes to HD   ED Triage Vitals  Enc Vitals Group     BP 04/06/22 1726 (!) 173/109  Pulse Rate 04/06/22 1715 88     Resp 04/06/22 1715 (!) 22     Temp --      Temp src --      SpO2 04/06/22 1715 98 %     Weight --      Height --      Head Circumference --      Peak Flow --      Pain Score --      Pain Loc --      Pain Edu? --      Excl. in [place]? --   TMAX(24)@     _________________________________________ Significant initial  Findings: Abnormal Labs Reviewed  CBC WITH DIFFERENTIAL/PLATELET - Abnormal; Notable for the following components:      Result Value   Hemoglobin 12.1 (*)    RDW 22.2 (*)    Lymphs Abs 0.6 (*)    All other components within normal limits  COMPREHENSIVE METABOLIC PANEL - Abnormal; Notable for  the following components:   Sodium 134 (*)    Potassium 5.9 (*)    Chloride 93 (*)    BUN 44 (*)    Creatinine, Ser 6.18 (*)    Calcium 8.2 (*)    Albumin 3.1 (*)    Total Bilirubin 1.5 (*)    GFR, Estimated 10 (*)    Anion gap 16 (*)    All other components within normal limits  BRAIN NATRIURETIC PEPTIDE - Abnormal; Notable for the following components:   B Natriuretic Peptide >4,500.0 (*)    All other components within normal limits  MAGNESIUM - Abnormal; Notable for the following components:   Magnesium 2.8 (*)    All other components within normal limits  PROTIME-INR - Abnormal; Notable for the following components:   Prothrombin Time 15.8 (*)    INR 1.3 (*)    All other components within normal limits  I-STAT VENOUS BLOOD GAS, ED - Abnormal; Notable for the following components:   pH, Ven 7.555 (*)    pCO2, Ven 35.2 (*)    pO2, Ven 141 (*)    Bicarbonate 31.2 (*)    Acid-Base Excess 9.0 (*)    Sodium 131 (*)    Potassium 6.1 (*)    Calcium, Ion 0.79 (*)    All other components within normal limits  TROPONIN I (HIGH SENSITIVITY) - Abnormal; Notable for the following components:   Troponin I (High Sensitivity) 88 (*)    All other components within normal limits     _________________________ Troponin 88 ECG: Ordered Personally reviewed and interpreted by me showing: HR : 86 Rhythm:Sinus rhythm Borderline left axis deviation Abnormal T, consider ischemia, lateral leads Similar to previous   QTC 433    The recent clinical data is shown below. Vitals:   04/06/22 1900 04/06/22 1930 04/06/22 1945 04/06/22 2000  BP: (!) 197/117 (!) 185/121 (!) 185/131 (!) 186/113  Pulse: 90 86 91 90  Resp: (!) 25 18 (!) 8 19  SpO2: 93% 94% 94% 94%       WBC     Component Value Date/Time   WBC 4.8 04/06/2022 1720   LYMPHSABS 0.6 (L) 04/06/2022 1720   MONOABS 0.9 04/06/2022 1720   EOSABS 0.0 04/06/2022 1720   BASOSABS 0.0 04/06/2022 1720     Results for orders placed or  performed during the hospital encounter of 03/13/22  MRSA Next Gen by PCR, Nasal     Status: None   Collection Time: 03/14/22 12:17 AM  Specimen: Nasal Mucosa; Nasal Swab  Result Value Ref Range Status   MRSA by PCR Next Gen NOT DETECTED NOT DETECTED Final    Comment: (NOTE) The GeneXpert MRSA Assay (FDA approved for NASAL specimens only), is one component of a comprehensive MRSA colonization surveillance program. It is not intended to diagnose MRSA infection nor to guide or monitor treatment for MRSA infections. Test performance is not FDA approved in patients less than 86 years old. Performed at [facility], [place] [address] [place], [place] [address]   C Difficile Quick Screen w PCR reflex     Status: None   Collection Time: 03/14/22  4:21 AM   Specimen: STOOL  Result Value Ref Range Status   C Diff antigen NEGATIVE NEGATIVE Final   C Diff toxin NEGATIVE NEGATIVE Final   C Diff interpretation No C. difficile detected.  Final    Comment: Performed at [facility], [place] [address] [place], [place] [address]  Gastrointestinal Panel by PCR , Stool     Status: None   Collection Time: 03/14/22  9:04 AM   Specimen: Stool  Result Value Ref Range Status   Campylobacter species NOT DETECTED NOT DETECTED Final   Plesimonas shigelloides NOT DETECTED NOT DETECTED Final   Salmonella species NOT DETECTED NOT DETECTED Final   Yersinia enterocolitica NOT DETECTED NOT DETECTED Final   Vibrio species NOT DETECTED NOT DETECTED Final   Vibrio cholerae NOT DETECTED NOT DETECTED Final   Enteroaggregative E coli (EAEC) NOT DETECTED NOT DETECTED Final   Enteropathogenic E coli (EPEC) NOT DETECTED NOT DETECTED Final   Enterotoxigenic E coli (ETEC) NOT DETECTED NOT DETECTED Final   Shiga like toxin producing E coli (STEC) NOT DETECTED NOT DETECTED Final   Shigella/Enteroinvasive E coli (EIEC) NOT DETECTED NOT DETECTED Final   Cryptosporidium NOT DETECTED NOT DETECTED Final   Cyclospora  cayetanensis NOT DETECTED NOT DETECTED Final   Entamoeba histolytica NOT DETECTED NOT DETECTED Final   Giardia lamblia NOT DETECTED NOT DETECTED Final   Adenovirus F40/41 NOT DETECTED NOT DETECTED Final   Astrovirus NOT DETECTED NOT DETECTED Final   Norovirus GI/GII NOT DETECTED NOT DETECTED Final   Rotavirus A NOT DETECTED NOT DETECTED Final   Sapovirus (I, II, IV, and V) NOT DETECTED NOT DETECTED Final    Comment: Performed at [facility], [address] [place], [place] [address]     _______________________________________________ Hospitalist was called for admission for acute on chronic diastolic CHF secondary to fluid overload and hyperkalemia   The following Work up has been ordered so far:  Orders Placed This Encounter  Procedures   Resp Panel by RT-PCR (Flu A&B, Covid) Anterior Nasal Swab   DG Chest Port 1 View   CBC with Differential   Comprehensive metabolic panel   Brain natriuretic peptide   Magnesium   Protime-INR   Hepatitis B surface antigen   Hepatitis B surface antibody   Hepatitis B surface antibody,quantitative   Informed Consent Details: Physician/Practitioner Attestation; Transcribe to consent form and obtain patient signature   Pre-Hemodialysis Protocol - Day of Dialysis   Post-Dialysis Protocol - Day of Dialysis   Cardiac monitoring   Consult to nephrology   Consult to nephrology   Consult to hospitalist   Bipap   CBG monitoring, ED   I-Stat venous blood gas, ED   EKG 12-Lead   Hemodialysis inpatient   Place in observation (patient's expected length of stay will be less than 2 midnights)  OTHER Significant initial  Findings:  labs showing:    Recent Labs  Lab 04/06/22 1720 04/06/22 1749  NA 134* 131*  K 5.9* 6.1*  CO2 25  --   GLUCOSE 82  --   BUN 44*  --   CREATININE 6.18*  --   CALCIUM 8.2*  --   MG 2.8*  --     Cr  stable,  Lab Results  Component Value Date   CREATININE 6.18 (H) 04/06/2022   CREATININE 5.36  (H) 03/13/2022   CREATININE 6.80 (H) 03/13/2022    Recent Labs  Lab 04/06/22 1720  AST 28  ALT 16  ALKPHOS 110  BILITOT 1.5*  PROT 7.8  ALBUMIN 3.1*   Lab Results  Component Value Date   CALCIUM 8.2 (L) 04/06/2022   PHOS 5.5 (H) 03/13/2022       Plt: Lab Results  Component Value Date   PLT 161 04/06/2022    COVID-19 Labs  No results for input(s): "DDIMER", "FERRITIN", "LDH", "CRP" in the last 72 hours.  Lab Results  Component Value Date   SARSCOV2NAA NEGATIVE 12/30/2021   SARSCOV2NAA NEGATIVE 11/12/2021   SARSCOV2NAA NEGATIVE 11/04/2021   SARSCOV2NAA NEGATIVE 10/31/2021    Venous  Blood Gas result:  pH   7.555 High  Sodium 131 Low  mmol/L  pCO2, Ven 35.2 Low  mmHg         Recent Labs  Lab 04/06/22 1720 04/06/22 1749  WBC 4.8  --   NEUTROABS 3.2  --   HGB 12.1* 13.6  HCT 39.3 40.0  MCV 84.9  --   PLT 161  --     HG/HCT  stable     Component Value Date/Time   HGB 13.6 04/06/2022 1749   HCT 40.0 04/06/2022 1749   MCV 84.9 04/06/2022 1720    Cardiac Panel (last 3 results) No results for input(s): "CKTOTAL", "CKMB", "TROPONINI", "RELINDX" in the last 72 hours.  .car BNP (last 3 results) Recent Labs    12/30/21 1110 02/13/22 1646 04/06/22 1720  BNP >4,500.0* >4,500.0* >4,500.0*     DM  labs:  HbA1C: No results for input(s): "HGBA1C" in the last 8760 hours.     CBG (last 3)  Recent Labs    04/06/22 1719  GLUCAP 82    Cultures:    Component Value Date/Time   SDES PERITONEAL FLUID 06/22/2021 1236   SPECREQUEST ABDOMEN 06/22/2021 1236   CULT  06/22/2021 1236    NO GROWTH 3 DAYS Performed at [facility], [place] [address], [place], [place] [address]    REPTSTATUS 06/26/2021 FINAL 06/22/2021 1236     Radiological Exams on Admission: DG Chest Port 1 View  Result Date: 04/06/2022 CLINICAL DATA:  Shortness of breath EXAM: PORTABLE CHEST 1 VIEW COMPARISON:  Portable exam 1739 hours compared to 03/13/2022 FINDINGS: Enlargement of  cardiac silhouette with pulmonary vascular congestion. Atherosclerotic calcification aorta. Decreased subsegmental atelectasis RIGHT base. No definite infiltrate, pleural effusion, or pneumothorax. Chronic elevation of RIGHT diaphragm. Osseous structures unremarkable. IMPRESSION: Enlargement of cardiac silhouette with pulmonary vascular congestion. Chronic elevation of RIGHT diaphragm with slightly decreased RIGHT basilar atelectasis. Aortic Atherosclerosis (ICD10-I70.0). Electronically Signed   By: Lavonia Dana M.D.   On: 04/06/2022 17:55   _______________________________________________________________________________________________________ Latest  Blood pressure (!) 186/113, pulse 90, resp. rate 19, SpO2 94 %.   Vitals  labs and radiology finding personally reviewed  Review of Systems:    Pertinent positives include:  shortness of breath at rest.  dyspnea on exertion,  Constitutional:  No weight loss, night sweats, Fevers, chills, fatigue, weight loss  HEENT:  No headaches, Difficulty swallowing,Tooth/dental problems,Sore throat,  No sneezing, itching, ear ache, nasal congestion, post nasal drip,  Cardio-vascular:  No chest pain, Orthopnea, PND, anasarca, dizziness, palpitations.no Bilateral lower extremity swelling  GI:  No heartburn, indigestion, abdominal pain, nausea, vomiting, diarrhea, change in bowel habits, loss of appetite, melena, blood in stool, hematemesis Resp:  no  No excess mucus, no productive cough, No non-productive cough, No coughing up of blood.No change in color of mucus.No wheezing. Skin:  no rash or lesions. No jaundice GU:  no dysuria, change in color of urine, no urgency or frequency. No straining to urinate.  No flank pain.  Musculoskeletal:  No joint pain or no joint swelling. No decreased range of motion. No back pain.  Psych:  No change in mood or affect. No depression or anxiety. No memory loss.  Neuro: no localizing neurological complaints, no tingling,  no weakness, no double vision, no gait abnormality, no slurred speech, no confusion  All systems reviewed and apart from [place] all are negative _______________________________________________________________________________________________ Past Medical History:   Past Medical History:  Diagnosis Date   Anemia of chronic kidney failure    BPH (benign prostatic hyperplasia)    Colon cancer ([place]) 2014   spouse states he had surgury for colon CA   End stage renal disease on dialysis ([place]) 2017   Hepatitis C    Hypertension    Hypertensive heart disease with chronic diastolic congestive heart failure ([place]) 07/17/2016   Polysubstance abuse ([place])    History of heroin and marijuana use    Past Surgical History:  Procedure Laterality Date   A/V FISTULAGRAM Left 12/02/2021   Procedure: A/V Fistulagram;  Surgeon: Cherre Robins, MD;  Location: [facility];  Service: Cardiovascular;  Laterality: Left;   ABDOMINAL SURGERY     AV FISTULA PLACEMENT Left 09/19/2016   Procedure: Left arm Radiocephalic ARTERIOVENOUS (AV) FISTULA CREATION;  Surgeon: Conrad Ceredo, MD;  Location: [facility];  Service: Vascular;  Laterality: Left;   [place] Left 07/09/2017   Procedure: BRACHIOCEPHALIC FISTULA CREATION;  Surgeon: Conrad Carpenter, MD;  Location: [facility];  Service: Vascular;  Laterality: Left;   COLON SURGERY  2014   ESOPHAGOGASTRODUODENOSCOPY (EGD) WITH PROPOFOL N/A 11/05/2021   Procedure: ESOPHAGOGASTRODUODENOSCOPY (EGD) WITH PROPOFOL;  Surgeon: Carol Ada, MD;  Location: [facility];  Service: Endoscopy;  Laterality: N/A;   ESOPHAGOGASTRODUODENOSCOPY (EGD) WITH PROPOFOL N/A 11/14/2021   Procedure: ESOPHAGOGASTRODUODENOSCOPY (EGD) WITH PROPOFOL;  Surgeon: [name], MD;  Location: [facility];  Service: Gastroenterology;  Laterality: N/A;   ESOPHAGOGASTRODUODENOSCOPY (EGD) WITH PROPOFOL N/A 11/16/2021   Procedure: ESOPHAGOGASTRODUODENOSCOPY (EGD) WITH PROPOFOL;  Surgeon: [name], MD;  Location: [facility];  Service: Gastroenterology;  Laterality: N/A;   HEMOSTASIS CLIP PLACEMENT  11/16/2021   Procedure: HEMOSTASIS CLIP PLACEMENT;  Surgeon: [name], MD;  Location: [facility];  Service: Gastroenterology;;   HEMOSTASIS CONTROL  11/05/2021   Procedure: HEMOSTASIS CONTROL;  Surgeon: Carol Ada, MD;  Location: [facility];  Service: Endoscopy;;   HEMOSTASIS CONTROL  11/16/2021   Procedure: HEMOSTASIS CONTROL;  Surgeon: [name], MD;  Location: [facility];  Service: Gastroenterology;;   HOT HEMOSTASIS N/A 11/16/2021   Procedure: HOT HEMOSTASIS (ARGON PLASMA COAGULATION/BICAP);  Surgeon: [name], MD;  Location: [facility];  Service: Gastroenterology;  Laterality: N/A;   INSERTION OF DIALYSIS CATHETER N/A 09/19/2016  Procedure: INSERTION OF TUNNELED DIALYSIS CATHETER;  Surgeon: Conrad [place], MD;  Location: [facility];  Service: Vascular;  Laterality: N/A;   IR ANGIOGRAM SELECTIVE EACH ADDITIONAL VESSEL  11/16/2021   IR ANGIOGRAM VISCERAL SELECTIVE  11/05/2021   IR ANGIOGRAM VISCERAL SELECTIVE  11/16/2021   IR ANGIOGRAM VISCERAL SELECTIVE  11/16/2021   IR EMBO ART  VEN HEMORR LYMPH EXTRAV  INC GUIDE ROADMAPPING  11/05/2021   IR EMBO ART  VEN HEMORR LYMPH EXTRAV  INC GUIDE ROADMAPPING  11/16/2021   IR GENERIC HISTORICAL  09/14/2016   IR US GUIDE VASC ACCESS RIGHT 09/14/2016 [name], DO [facility]   IR GENERIC HISTORICAL  09/14/2016   IR FLUORO GUIDE CV LINE RIGHT 09/14/2016 [name], DO [facility]   IR PARACENTESIS  06/22/2021   IR US GUIDE [place] RIGHT  11/05/2021   IR US GUIDE [place] RIGHT  11/16/2021   LAPAROTOMY N/A 05/23/2021   Procedure: EXPLORATORY LAPAROTOMY LYSIS ADHESIONS;  Surgeon: [name], MD;  Location: [facility];  Service: General;  Laterality: N/A;   LAPAROTOMY N/A 11/20/2021   Procedure: EXPLORATORY LAPAROTOMY, REPAIR OF BLEEDING DUODENAL ULCER;  Surgeon: Dwan Bolt, MD;  Location: [facility];  Service: General;  Laterality:  N/A;   ORIF TIBIA PLATEAU Left 01/15/2018   Procedure: OPEN REDUCTION INTERNAL FIXATION (ORIF) TIBIAL PLATEAU;  Surgeon: Altamese Winthrop, MD;  Location: [facility];  Service: Orthopedics;  Laterality: Left;   SCLEROTHERAPY  11/05/2021   Procedure: Clide Deutscher;  Surgeon: Carol Ada, MD;  Location: [facility];  Service: Endoscopy;;   SCLEROTHERAPY  11/16/2021   Procedure: Clide Deutscher;  Surgeon: [name], MD;  Location: [facility] ENDOSCOPY;  Service: Gastroenterology;;   TRANSTHORACIC ECHOCARDIOGRAM  07/2016    EF 60-65%, No RWMA. Mod Concentric LVH - Gr 2 DD. Severe LA dilation. PAP ~35 mmHg (mild Pulm HTN)  --> no changes noted 1 month later    Social History:  Ambulatory   independently       reports that he has been smoking cigarettes. He has been smoking an average of .25 packs per day. He has never used smokeless tobacco. He reports current drug use. Frequency: 1.00 time per week. Drugs: Marijuana and Heroin. He reports that he does not drink alcohol.     Family History:   Family History  Problem Relation Age of Onset   Heart failure Mother        Died at age 55.   Heart attack Mother 80   Hypertension Mother    Diabetes Mellitus II Mother    Other Father        Unknown   Kidney failure Sister        (Oldest Sister)   Other Other        Multiple siblings have started her heart disease, he is not sure of the details.   CAD Nephew    ______________________________________________________________________________________________ Allergies: No Known Allergies   Prior to Admission medications   Medication Sig Start Date End Date Taking? Authorizing Provider  albuterol (VENTOLIN HFA) 108 (90 Base) MCG/ACT inhaler Inhale 2 puffs into the lungs every 6 (six) hours as needed for wheezing or shortness of breath. 01/06/22   [provider]  carvedilol (COREG) 6.25 MG tablet Take 1 tablet (6.25 mg total) by mouth 2 (two) times daily with a meal. 01/01/22   Amponsah, Charisse March,  MD  diltiazem (TIAZAC) 360 MG 24 hr capsule Take 360 mg by mouth daily.    [provider]  DULoxetine (CYMBALTA) 20 MG capsule Take 20 mg by mouth daily. 01/06/22   [provider]  hydrALAZINE (APRESOLINE) 25 MG tablet Take 1 tablet (25 mg total) by mouth every 6 (six) hours. 03/15/22   Danford, Suann Larry, MD  LIDOCAINE-PRILOCAINE EX Apply 1 application. topically Every Tuesday,Thursday,and Saturday with dialysis. Patient not taking: Reported on 03/14/2022    [provider]  multivitamin (RENA-VIT) TABS tablet Take 1 tablet by mouth at bedtime. 03/15/22   Danford, Suann Larry, MD  oxyCODONE (OXY IR/ROXICODONE) 5 MG immediate release tablet Take 5 mg by mouth every 8 (eight) hours as needed for pain. 12/09/21   [provider]  sevelamer carbonate (RENVELA) 800 MG tablet Take 800 mg by mouth 2 (two) times daily with a meal. 11/14/21   [provider]    ___________________________________________________________________________________________________ Physical Exam:    04/06/2022    8:00 PM 04/06/2022    7:45 PM 04/06/2022    7:30 PM  Vitals with BMI  Systolic 284 132 440  Diastolic 102 725 366  Pulse 90 91 86     1. General:  in No  Acute distress    Chronically ill  -appearing 2. Psychological: Alert and  Oriented 3. Head/ENT:   Moist   Mucous Membranes                          Head Non traumatic, neck supple                           Poor Dentition 4. SKIN: normal   Skin turgor,  Skin clean Dry and intact no rash 5. Heart: Regular rate and rhythm no  Murmur, no Rub or gallop 6. Lungs: some  crackles   7. Abdomen: Soft,  non-tender,  distended  bowel sounds present 8. Lower extremities: no clubbing, cyanosis, no ***edema 9. Neurologically Grossly intact, moving all 4 extremities equally  10. MSK: Normal range of motion    Chart has been  reviewed  ______________________________________________________________________________________________  Assessment/Plan  61 y.o. male with medical history significant of ESRD on HD TH Sat, A.fib, HTN, drug abuse, chronic CHF, history of bleeding duodenal ulcer status post surgical repair, anemia of chronic disease, history of colon cancer    Admitted for Acute respiratory distress acute on chronic diastolic CHF hypervolemia, unspecified hypervolemia type Acute hyperkalemia   Present on Admission:  Acute on chronic diastolic CHF (congestive heart failure) ([place])     No problem-specific Assessment & Plan notes found for this encounter.    Other plan as per orders.  DVT prophylaxis:  SCD      Code Status:    Code Status: Prior FULL CODE *** DNR/DNI ***comfort care as per patient ***family  I had personally discussed CODE STATUS with patient and family* I had spent *min discussing goals of care and CODE STATUS    Family Communication:   Family not at  Bedside  plan of care was discussed on the phone with *** Son, Daughter, Wife, Husband, Sister, Brother , father, mother  Disposition Plan:        To home once workup is complete and patient is stable  ***Following barriers for discharge:                            Electrolytes corrected  A                            Will need consultants to evaluate patient prior to discharge                       ***Would benefit from PT/OT eval prior to DC  Ordered                   Swallow eval - SLP ordered                   Diabetes care coordinator                   Transition of care consulted                   Nutrition    consulted                  Wound care  consulted                   Palliative care    consulted                   Behavioral health  consulted                    Consults called:  nephrology is aware  Admission status:  ED Disposition     ED Disposition  [place]: [place] [[id number]]  Level of Care: Progressive [102]  Admit to Progressive based on following criteria: CARDIOVASCULAR & THORACIC of moderate stability with acute coronary syndrome symptoms/low risk myocardial infarction/hypertensive urgency/arrhythmias/heart failure potentially compromising stability and stable post cardiovascular intervention patients.  May place patient in observation at [facility] or [place] if equivalent level of care is available:: No  Covid Evaluation: Asymptomatic - no recent exposure (last 10 days) testing not required  Diagnosis: Acute on chronic diastolic CHF (congestive heart failure) [facility]) [[id number]]  Admitting Physician: Toy Baker [[id number]]  Attending Physician: Toy Baker [[id number]]           inpatient     I Expect 2 midnight stay secondary to severity of patient's current illness need for inpatient interventions justified by the following:  hemodynamic instability despite optimal treatment ( hypoxia )  Severe lab/radiological/exam abnormalities including:    CHF fluid overload and extensive comorbidities including:  substance abuse  CHF  COPD/asthma ESRD   That are currently affecting medical management.   I expect  patient to be hospitalized for 2 midnights requiring inpatient medical care.  Patient is at high risk for adverse outcome (such as loss of life or disability) if not treated.  Indication for inpatient stay as follows:  Severe change from baseline regarding mental status   Need for operative/procedural  intervention New or worsening hypoxia   Need for emergent HD. BIPAP    Level of care        progressive tele indefinitely please discontinue once patient no longer qualifies COVID-19 Labs    Lab Results  Component Value Date   [place] NEGATIVE 12/30/2021     Precautions: admitted as *** Covid Negative  ***asymptomatic screening protocol****PUI *** covid  positive No active isolations ***If Covid PCR is negative  - please DC precautions - would need additional investigation given very high risk  for false native test result    Critical***  Patient is critically ill due to  hemodynamic instability * respiratory failure *severe sepsis* ongoing chest pain*  They are at high risk for life/limb threatening clinical deterioration requiring frequent reassessment and modifications of care.  Services provided include examination of the patient, review of relevant ancillary tests, prescription of lifesaving therapies, review of medications and prophylactic therapy.  Total critical care time excluding separately billable procedures: 60*  Minutes.    Sya Nestler 04/06/2022, 8:25 PM ***  [facility]     after 2 AM please page floor coverage PA If 7AM-7PM, please contact the day team taking care of the patient using [email or web address]   Patient was evaluated in the context of the global COVID-19 pandemic, which necessitated consideration that the patient might be at risk for infection with the SARS-CoV-2 virus that causes COVID-19. Institutional protocols and algorithms that pertain to the evaluation of patients at risk for COVID-19 are in a state of rapid change based on information released by regulatory bodies including the CDC and federal and state organizations. These policies and algorithms were followed during the patient's care.

## 2022-04-06 NOTE — Assessment & Plan Note (Signed)
in the setting of needing hemodialysis fluid overload. Nephrology is aware plan to hemodialise tongiht Continue home medications once stable

## 2022-04-07 ENCOUNTER — Encounter (HOSPITAL_COMMUNITY): Payer: Self-pay | Admitting: Pulmonary Disease

## 2022-04-07 ENCOUNTER — Inpatient Hospital Stay (HOSPITAL_COMMUNITY): Payer: Medicare Other

## 2022-04-07 DIAGNOSIS — D631 Anemia in chronic kidney disease: Secondary | ICD-10-CM

## 2022-04-07 DIAGNOSIS — I169 Hypertensive crisis, unspecified: Secondary | ICD-10-CM

## 2022-04-07 DIAGNOSIS — I160 Hypertensive urgency: Secondary | ICD-10-CM

## 2022-04-07 HISTORY — DX: Hypertensive crisis, unspecified: I16.9

## 2022-04-07 LAB — PHOSPHORUS: Phosphorus: 7 mg/dL — ABNORMAL HIGH (ref 2.5–4.6)

## 2022-04-07 LAB — RENAL FUNCTION PANEL
Albumin: 3 g/dL — ABNORMAL LOW (ref 3.5–5.0)
Anion gap: 15 (ref 5–15)
BUN: 30 mg/dL — ABNORMAL HIGH (ref 8–23)
CO2: 27 mmol/L (ref 22–32)
Calcium: 8.5 mg/dL — ABNORMAL LOW (ref 8.9–10.3)
Chloride: 93 mmol/L — ABNORMAL LOW (ref 98–111)
Creatinine, Ser: 4.28 mg/dL — ABNORMAL HIGH (ref 0.61–1.24)
GFR, Estimated: 15 mL/min — ABNORMAL LOW (ref >60)
Glucose, Bld: 237 mg/dL — ABNORMAL HIGH (ref 70–99)
Phosphorus: 4.6 mg/dL (ref 2.5–4.6)
Potassium: 3.4 mmol/L — ABNORMAL LOW (ref 3.5–5.1)
Sodium: 135 mmol/L (ref 135–145)

## 2022-04-07 LAB — CBC
HCT: 38.3 % — ABNORMAL LOW (ref 39.0–52.0)
Hemoglobin: 12.3 g/dL — ABNORMAL LOW (ref 13.0–17.0)
MCH: 26.7 pg (ref 26.0–34.0)
MCHC: 32.1 g/dL (ref 30.0–36.0)
MCV: 83.1 fL (ref 80.0–100.0)
Platelets: 168 10*3/uL (ref 150–400)
RBC: 4.61 MIL/uL (ref 4.22–5.81)
RDW: 21.8 % — ABNORMAL HIGH (ref 11.5–15.5)
WBC: 4.8 10*3/uL (ref 4.0–10.5)
nRBC: 0 % (ref 0.0–0.2)

## 2022-04-07 LAB — BASIC METABOLIC PANEL
Anion gap: 17 — ABNORMAL HIGH (ref 5–15)
BUN: 52 mg/dL — ABNORMAL HIGH (ref 8–23)
CO2: 25 mmol/L (ref 22–32)
Calcium: 8.7 mg/dL — ABNORMAL LOW (ref 8.9–10.3)
Chloride: 90 mmol/L — ABNORMAL LOW (ref 98–111)
Creatinine, Ser: 6.7 mg/dL — ABNORMAL HIGH (ref 0.61–1.24)
GFR, Estimated: 9 mL/min — ABNORMAL LOW (ref >60)
Glucose, Bld: 113 mg/dL — ABNORMAL HIGH (ref 70–99)
Potassium: 4.3 mmol/L (ref 3.5–5.1)
Sodium: 132 mmol/L — ABNORMAL LOW (ref 135–145)

## 2022-04-07 LAB — MAGNESIUM: Magnesium: 2.9 mg/dL — ABNORMAL HIGH (ref 1.7–2.4)

## 2022-04-07 LAB — LACTIC ACID, PLASMA: Lactic Acid, Venous: 3.4 mmol/L (ref 0.5–1.9)

## 2022-04-07 LAB — HEPATITIS B SURFACE ANTIBODY,QUALITATIVE: Hep B S Ab: REACTIVE — AB

## 2022-04-07 LAB — TROPONIN I (HIGH SENSITIVITY): Troponin I (High Sensitivity): 113 ng/L (ref <18)

## 2022-04-07 LAB — MRSA NEXT GEN BY PCR, NASAL: MRSA by PCR Next Gen: NOT DETECTED

## 2022-04-07 LAB — HEPATITIS B SURFACE ANTIGEN: Hepatitis B Surface Ag: NONREACTIVE

## 2022-04-07 MED ORDER — CHLORHEXIDINE GLUCONATE CLOTH 2 % EX PADS: 6.0000 | MEDICATED_PAD | Freq: Every day | CUTANEOUS | Status: DC

## 2022-04-07 MED ORDER — ORAL CARE MOUTH RINSE
15.0000 mL | OROMUCOSAL | Status: DC
  Administered 2022-04-07 – 2022-04-08 (×3): 15 mL via OROMUCOSAL

## 2022-04-07 MED ORDER — LIDOCAINE HCL (PF) 1 % IJ SOLN: 5.0000 mL | INTRAMUSCULAR | Status: DC | PRN

## 2022-04-07 MED ORDER — MORPHINE SULFATE (PF) 2 MG/ML IV SOLN
1.0000 mg | INTRAVENOUS | Status: DC | PRN
  Administered 2022-04-07 – 2022-04-08 (×8): 1 mg via INTRAVENOUS
  Filled 2022-04-07 (×8): qty 1

## 2022-04-07 MED ORDER — HYDRALAZINE HCL 20 MG/ML IJ SOLN
5.0000 mg | INTRAMUSCULAR | Status: DC | PRN
  Administered 2022-04-07 (×2): 5 mg via INTRAVENOUS
  Filled 2022-04-07: qty 1

## 2022-04-07 MED ORDER — DULOXETINE HCL 20 MG PO CPEP
20.0000 mg | ORAL_CAPSULE | Freq: Every day | ORAL | Status: DC
  Administered 2022-04-08 – 2022-04-09 (×2): 20 mg via ORAL
  Filled 2022-04-07 (×3): qty 1

## 2022-04-07 MED ORDER — ORAL CARE MOUTH RINSE: 15.0000 mL | OROMUCOSAL | Status: DC | PRN

## 2022-04-07 MED ORDER — RENA-VITE PO TABS
1.0000 | ORAL_TABLET | Freq: Every day | ORAL | Status: DC
  Administered 2022-04-07 – 2022-04-08 (×2): 1 via ORAL
  Filled 2022-04-07 (×2): qty 1

## 2022-04-07 MED ORDER — HYDRALAZINE HCL 50 MG PO TABS
50.0000 mg | ORAL_TABLET | Freq: Four times a day (QID) | ORAL | Status: DC
  Administered 2022-04-07 (×2): 50 mg via ORAL
  Filled 2022-04-07 (×3): qty 1

## 2022-04-07 MED ORDER — MORPHINE SULFATE (PF) 2 MG/ML IV SOLN
1.0000 mg | Freq: Once | INTRAVENOUS | Status: AC
  Administered 2022-04-07: 1 mg via INTRAVENOUS
  Filled 2022-04-07: qty 1

## 2022-04-07 MED ORDER — HEPARIN SODIUM (PORCINE) 1000 UNIT/ML DIALYSIS: 1000.0000 [IU] | INTRAMUSCULAR | Status: DC | PRN

## 2022-04-07 MED ORDER — CAPSAICIN 0.025 % EX CREA
TOPICAL_CREAM | Freq: Two times a day (BID) | CUTANEOUS | Status: DC | PRN
  Filled 2022-04-07: qty 60

## 2022-04-07 MED ORDER — NITROGLYCERIN 0.4 MG SL SUBL
0.4000 mg | SUBLINGUAL_TABLET | SUBLINGUAL | Status: DC | PRN
  Administered 2022-04-07: 0.4 mg via SUBLINGUAL

## 2022-04-07 MED ORDER — HYDRALAZINE HCL 20 MG/ML IJ SOLN
5.0000 mg | INTRAMUSCULAR | Status: AC | PRN
  Administered 2022-04-08: 5 mg via INTRAVENOUS
  Filled 2022-04-07: qty 1

## 2022-04-07 MED ORDER — CARVEDILOL 6.25 MG PO TABS
6.2500 mg | ORAL_TABLET | Freq: Two times a day (BID) | ORAL | Status: DC
Start: 2022-04-07 — End: 2022-04-09
  Administered 2022-04-07 – 2022-04-09 (×3): 6.25 mg via ORAL
  Filled 2022-04-07 (×4): qty 1

## 2022-04-07 MED ORDER — MORPHINE SULFATE (PF) 2 MG/ML IV SOLN
1.0000 mg | INTRAVENOUS | Status: AC
  Administered 2022-04-07: 1 mg via INTRAVENOUS
  Filled 2022-04-07: qty 1

## 2022-04-07 MED ORDER — HYDRALAZINE HCL 25 MG PO TABS: 25.0000 mg | ORAL_TABLET | Freq: Four times a day (QID) | ORAL | Status: DC

## 2022-04-07 MED ORDER — LIDOCAINE-PRILOCAINE 2.5-2.5 % EX CREA: 1.0000 | TOPICAL_CREAM | CUTANEOUS | Status: DC | PRN

## 2022-04-07 MED ORDER — PENTAFLUOROPROP-TETRAFLUOROETH EX AERO: 1.0000 | INHALATION_SPRAY | CUTANEOUS | Status: DC | PRN

## 2022-04-07 MED ORDER — DILTIAZEM HCL ER COATED BEADS 360 MG PO CP24
360.0000 mg | ORAL_CAPSULE | Freq: Every day | ORAL | Status: DC
Start: 2022-04-07 — End: 2022-04-07
  Filled 2022-04-07: qty 1

## 2022-04-07 MED ORDER — NITROGLYCERIN 0.4 MG SL SUBL
SUBLINGUAL_TABLET | SUBLINGUAL | Status: AC
  Filled 2022-04-07: qty 1

## 2022-04-07 MED ORDER — DILTIAZEM HCL ER COATED BEADS 180 MG PO CP24
360.0000 mg | ORAL_CAPSULE | Freq: Every day | ORAL | Status: DC
  Administered 2022-04-07 – 2022-04-09 (×2): 360 mg via ORAL
  Filled 2022-04-07 (×2): qty 1
  Filled 2022-04-07: qty 2

## 2022-04-07 MED ORDER — METHOCARBAMOL 500 MG PO TABS
500.0000 mg | ORAL_TABLET | Freq: Three times a day (TID) | ORAL | Status: DC
  Administered 2022-04-07 – 2022-04-09 (×5): 500 mg via ORAL
  Filled 2022-04-07 (×5): qty 1

## 2022-04-07 MED ORDER — DIPHENHYDRAMINE HCL 12.5 MG/5ML PO ELIX
12.5000 mg | ORAL_SOLUTION | Freq: Three times a day (TID) | ORAL | Status: DC | PRN
  Administered 2022-04-07: 12.5 mg via ORAL
  Filled 2022-04-07 (×2): qty 5

## 2022-04-07 MED ORDER — LABETALOL HCL 5 MG/ML IV SOLN: 10.0000 mg | INTRAVENOUS | Status: DC | PRN

## 2022-04-07 NOTE — Progress Notes (Signed)
eLink Physician-Brief Progress Note Patient Name: Timothy [name] DOB: 03/19/1961 MRN: [id number]   Date of Service  04/07/2022  HPI/Events of Note  Patient admitted via ED with altered mental status, hyperkalemia, elevated blood pressure and BUN, in the context of ESRD-DD, missed full dialysis today because he was having cramps, he also has acute respiratory failure and is currently on BIPAP.  eICU Interventions  New Patient Evaluation.        Thomasene Lot Kinzly Pierrelouis 04/07/2022, 12:07 AM

## 2022-04-07 NOTE — Progress Notes (Signed)
Patient became agitated and verbally aggressive with nursing staff.  Refusing to let us keep him on the monitor or oxygen.  Assisted pt to the bathroom and back to bed.  Patient stating "I don't need to be here anymore.  You got the fluid off. Leave me alone and let me go home".  This nurse explained to the patient he is in the ICU and we have to keep the monitor and oxygen on him per MD orders.  He is not wanting to answer assessment questions at this time.

## 2022-04-07 NOTE — Consult Note (Addendum)
[facility] Renal Consultation Note    Indication for Consultation:  Management of ESRD/hemodialysis; anemia, hypertension/volume and secondary hyperparathyroidism PCP: Dr. Julio Sicks Nephrology: Dr. Valentino Nose  HPI: Timothy [name] is a 61 y.o. male with ESRD on hemodialysis T,Th, S at [facility].PMH: HTN, Hepatitis C, HFpEF, polysubstance abuse, tobacco abuse, anemia, SHPT.  Last HD 04/06/2022 signed off early, left 3.7 kg above OP EDW. Patient presented to ED in evening 04/06/2022 via EMS for respiratory distress. He was started on BIPAP. Initially patient refused treatment or to answer questions but ultimately became more lethargic. He had urgent HD overnight for volume overload with 4 liters removed. Called by PCCM this AM to notify us that patient is again requiring BIPAP with flash pulmonary edema. Repeat CXR this am with cardiomegaly with central pulmonary vascular congestion. No overt pulmonary edema. He remains hypertensive BP 165/70. SR on monitor. He is on BIPAP, not following commands, not answering questions at present however another HD patient came to visit and now he is talking. Wants to know when he will get the fluid "out of his lungs".  Will have HD again later this afternoon.   Past Medical History:  Diagnosis Date   Acute metabolic encephalopathy 04/06/2022   Anemia of chronic kidney failure    BPH (benign prostatic hyperplasia)    Colon cancer [facility]) 2014   spouse states he had surgury for colon CA   End stage renal disease on dialysis (HCC) 2017   Hepatitis C    Hypertension    Hypertensive crisis 04/07/2022   Hypertensive heart disease with chronic diastolic congestive heart failure (HCC) 07/17/2016   Polysubstance abuse (HCC)    History of heroin and marijuana use   Past Surgical History:  Procedure Laterality Date   A/V FISTULAGRAM Left 12/02/2021   Procedure: A/V Fistulagram;  Surgeon: Leonie Douglas, MD;  Location: [facility] INVASIVE CV  LAB;  Service: Cardiovascular;  Laterality: Left;   ABDOMINAL SURGERY     AV FISTULA PLACEMENT Left 09/19/2016   Procedure: Left arm Radiocephalic ARTERIOVENOUS (AV) FISTULA CREATION;  Surgeon: Fransisco Hertz, MD;  Location: [facility] OR;  Service: Vascular;  Laterality: Left;   BASCILIC VEIN TRANSPOSITION Left 07/09/2017   Procedure: BRACHIOCEPHALIC FISTULA CREATION;  Surgeon: Fransisco Hertz, MD;  Location: [facility] OR;  Service: Vascular;  Laterality: Left;   COLON SURGERY  2014   ESOPHAGOGASTRODUODENOSCOPY (EGD) WITH PROPOFOL N/A 11/05/2021   Procedure: ESOPHAGOGASTRODUODENOSCOPY (EGD) WITH PROPOFOL;  Surgeon: Jeani Hawking, MD;  Location: [facility] ENDOSCOPY;  Service: Endoscopy;  Laterality: N/A;   ESOPHAGOGASTRODUODENOSCOPY (EGD) WITH PROPOFOL N/A 11/14/2021   Procedure: ESOPHAGOGASTRODUODENOSCOPY (EGD) WITH PROPOFOL;  Surgeon: Imogene Burn, MD;  Location: [facility] ENDOSCOPY;  Service: Gastroenterology;  Laterality: N/A;   ESOPHAGOGASTRODUODENOSCOPY (EGD) WITH PROPOFOL N/A 11/16/2021   Procedure: ESOPHAGOGASTRODUODENOSCOPY (EGD) WITH PROPOFOL;  Surgeon: Imogene Burn, MD;  Location: [facility] ENDOSCOPY;  Service: Gastroenterology;  Laterality: N/A;   HEMOSTASIS CLIP PLACEMENT  11/16/2021   Procedure: HEMOSTASIS CLIP PLACEMENT;  Surgeon: Imogene Burn, MD;  Location: [facility] ENDOSCOPY;  Service: Gastroenterology;;   HEMOSTASIS CONTROL  11/05/2021   Procedure: HEMOSTASIS CONTROL;  Surgeon: Jeani Hawking, MD;  Location: [facility] ENDOSCOPY;  Service: Endoscopy;;   HEMOSTASIS CONTROL  11/16/2021   Procedure: HEMOSTASIS CONTROL;  Surgeon: Imogene Burn, MD;  Location: [facility] ENDOSCOPY;  Service: Gastroenterology;;   HOT HEMOSTASIS N/A 11/16/2021   Procedure: HOT HEMOSTASIS (ARGON PLASMA COAGULATION/BICAP);  Surgeon: Imogene Burn, MD;  Location: [facility] ENDOSCOPY;  Service: Gastroenterology;  Laterality: N/A;   INSERTION OF DIALYSIS CATHETER N/A 09/19/2016   Procedure: INSERTION OF TUNNELED DIALYSIS CATHETER;  Surgeon: Fransisco Hertz, MD;  Location: [facility] OR;   Service: Vascular;  Laterality: N/A;   IR ANGIOGRAM SELECTIVE EACH ADDITIONAL VESSEL  11/16/2021   IR ANGIOGRAM VISCERAL SELECTIVE  11/05/2021   IR ANGIOGRAM VISCERAL SELECTIVE  11/16/2021   IR ANGIOGRAM VISCERAL SELECTIVE  11/16/2021   IR EMBO ART  VEN HEMORR LYMPH EXTRAV  INC GUIDE ROADMAPPING  11/05/2021   IR EMBO ART  VEN HEMORR LYMPH EXTRAV  INC GUIDE ROADMAPPING  11/16/2021   IR GENERIC HISTORICAL  09/14/2016   IR US GUIDE VASC ACCESS RIGHT 09/14/2016 Gilmer Mor, DO [facility]INTER[facility]   IR GENERIC HISTORICAL  09/14/2016   IR FLUORO GUIDE CV LINE RIGHT 09/14/2016 Gilmer Mor, DO [facility]   IR PARACENTESIS  06/22/2021   IR US GUIDE VASC ACCESS RIGHT  11/05/2021   IR US GUIDE VASC ACCESS RIGHT  11/16/2021   LAPAROTOMY N/A 05/23/2021   Procedure: EXPLORATORY LAPAROTOMY LYSIS ADHESIONS;  Surgeon: Emelia Loron, MD;  Location: [facility] OR;  Service: General;  Laterality: N/A;   LAPAROTOMY N/A 11/20/2021   Procedure: EXPLORATORY LAPAROTOMY, REPAIR OF BLEEDING DUODENAL ULCER;  Surgeon: Fritzi Mandes, MD;  Location: [facility] OR;  Service: General;  Laterality: N/A;   ORIF TIBIA PLATEAU Left 01/15/2018   Procedure: OPEN REDUCTION INTERNAL FIXATION (ORIF) TIBIAL PLATEAU;  Surgeon: Myrene Galas, MD;  Location: [facility] OR;  Service: Orthopedics;  Laterality: Left;   SCLEROTHERAPY  11/05/2021   Procedure: Susa Day;  Surgeon: Jeani Hawking, MD;  Location: [facility] ENDOSCOPY;  Service: Endoscopy;;   SCLEROTHERAPY  11/16/2021   Procedure: Susa Day;  Surgeon: Imogene Burn, MD;  Location: [facility] ENDOSCOPY;  Service: Gastroenterology;;   TRANSTHORACIC ECHOCARDIOGRAM  07/2016    EF 60-65%, No RWMA. Mod Concentric LVH - Gr 2 DD. Severe LA dilation. PAP ~35 mmHg (mild Pulm HTN)  --> no changes noted 1 month later   Family History  Problem Relation Age of Onset   Heart failure Mother        Died at age 70.   Heart attack Mother 36   Hypertension Mother    Diabetes Mellitus II Mother    Other Father        Unknown   Kidney  failure Sister        (Oldest Sister)   Other Other        Multiple siblings have started her heart disease, he is not sure of the details.   CAD Nephew    Social History:  reports that he has been smoking cigarettes. He has been smoking an average of .25 packs per day. He has never used smokeless tobacco. He reports current drug use. Frequency: 1.00 time per week. Drugs: Marijuana and Heroin. He reports that he does not drink alcohol. No Known Allergies Prior to Admission medications   Medication Sig Start Date End Date Taking? Authorizing Provider  albuterol (VENTOLIN HFA) 108 (90 Base) MCG/ACT inhaler Inhale 2 puffs into the lungs every 6 (six) hours as needed for wheezing or shortness of breath. 01/06/22   [provider]  carvedilol (COREG) 6.25 MG tablet Take 1 tablet (6.25 mg total) by mouth 2 (two) times daily with a meal. 01/01/22   Amponsah, Flossie Buffy, MD  diltiazem (TIAZAC) 360 MG 24 hr capsule Take 360 mg by mouth daily.    [provider]  DULoxetine (CYMBALTA) 20 MG capsule Take 20 mg by  mouth daily. 01/06/22   [provider]  hydrALAZINE (APRESOLINE) 25 MG tablet Take 1 tablet (25 mg total) by mouth every 6 (six) hours. 03/15/22   Danford, Earl Lites, MD  LIDOCAINE-PRILOCAINE EX Apply 1 application. topically Every Tuesday,Thursday,and Saturday with dialysis. Patient not taking: Reported on 03/14/2022    [provider]  multivitamin (RENA-VIT) TABS tablet Take 1 tablet by mouth at bedtime. 03/15/22   Danford, Earl Lites, MD  oxyCODONE (OXY IR/ROXICODONE) 5 MG immediate release tablet Take 5 mg by mouth every 8 (eight) hours as needed for pain. 12/09/21   [provider]  sevelamer carbonate (RENVELA) 800 MG tablet Take 800 mg by mouth 2 (two) times daily with a meal. 11/14/21   [provider]   Current Facility-Administered Medications  Medication Dose Route Frequency Provider Last Rate Last Admin   carvedilol (COREG)  tablet 6.25 mg  6.25 mg Oral BID WC Simonne Martinet, NP       [START ON 04/08/2022] Chlorhexidine Gluconate Cloth 2 % PADS 6 each  6 each Topical Q0600 Darnell Level, MD   6 each at 04/06/22 2352   diltiazem (CARDIZEM CD) 24 hr capsule 360 mg  360 mg Oral Daily Simonne Martinet, NP       docusate sodium (COLACE) capsule 100 mg  100 mg Oral BID PRN Celine Mans, Rahul P, PA-C       DULoxetine (CYMBALTA) DR capsule 20 mg  20 mg Oral Daily Simonne Martinet, NP       heparin injection 1,000 Units  1,000 Units Dialysis PRN Darnell Level, MD       heparin injection 5,000 Units  5,000 Units Subcutaneous Q8H Desai, Rahul P, PA-C   5,000 Units at 04/07/22 1027   hydrALAZINE (APRESOLINE) injection 10-40 mg  10-40 mg Intravenous Q4H PRN Desai, Rahul P, PA-C   20 mg at 04/06/22 2352   hydrALAZINE (APRESOLINE) tablet 50 mg  50 mg Oral QID Cheri Fowler, MD       labetalol (NORMODYNE) injection 10-20 mg  10-20 mg Intravenous Q2H PRN Oretha Milch, MD   20 mg at 04/07/22 1057   lidocaine (PF) (XYLOCAINE) 1 % injection 5 mL  5 mL Intradermal PRN Darnell Level, MD       lidocaine-prilocaine (EMLA) cream 1 Application  1 Application Topical PRN Darnell Level, MD       multivitamin (RENA-VIT) tablet 1 tablet  1 tablet Oral QHS Simonne Martinet, NP       nitroGLYCERIN (NITROSTAT) SL tablet 0.4 mg  0.4 mg Sublingual Q5 min PRN Simonne Martinet, NP   0.4 mg at 04/07/22 1020   Oral care mouth rinse  15 mL Mouth Rinse 4 times per day Oretha Milch, MD       Oral care mouth rinse  15 mL Mouth Rinse PRN Oretha Milch, MD       pentafluoroprop-tetrafluoroeth (GEBAUERS) aerosol 1 Application  1 Application Topical PRN Darnell Level, MD       polyethylene glycol (MIRALAX / GLYCOLAX) packet 17 g  17 g Oral Daily PRN Kathlene Cote, PA-C       Labs: Basic Metabolic Panel: Recent Labs  Lab 04/06/22 1720 04/06/22 1749 04/06/22 2134 04/07/22 0145 04/07/22 0913  NA 134*   < > 132* 132* 135  K 5.9*   < >  4.5 4.3 3.4*  CL 93*  --   --  90* 93*  CO2 25  --   --  25 27  GLUCOSE 82  --   --  113* 237*  BUN 44*  --   --  52* 30*  CREATININE 6.18*  --   --  6.70* 4.28*  CALCIUM 8.2*  --   --  8.7* 8.5*  PHOS  --   --   --  7.0* 4.6   < > = values in this interval not displayed.   Liver Function Tests: Recent Labs  Lab 04/06/22 1720 04/07/22 0913  AST 28  --   ALT 16  --   ALKPHOS 110  --   BILITOT 1.5*  --   PROT 7.8  --   ALBUMIN 3.1* 3.0*   No results for input(s): "LIPASE", "AMYLASE" in the last 168 hours. Recent Labs  Lab 04/06/22 2139  AMMONIA 34   CBC: Recent Labs  Lab 04/06/22 1720 04/06/22 1749 04/06/22 2134 04/07/22 0145  WBC 4.8  --   --  4.8  NEUTROABS 3.2  --   --   --   HGB 12.1* 13.6 13.9 12.3*  HCT 39.3 40.0 41.0 38.3*  MCV 84.9  --   --  83.1  PLT 161  --   --  168   Cardiac Enzymes: No results for input(s): "CKTOTAL", "CKMB", "CKMBINDEX", "TROPONINI" in the last 168 hours. CBG: Recent Labs  Lab 04/06/22 1719 04/06/22 2344  GLUCAP 82 103*   Iron Studies: No results for input(s): "IRON", "TIBC", "TRANSFERRIN", "FERRITIN" in the last 72 hours. Studies/Results: DG Chest Port 1 View  Result Date: 04/07/2022 CLINICAL DATA:  Provided history: Pulmonary edema. Respiratory distress EXAM: PORTABLE CHEST 1 VIEW COMPARISON:  Chest radiographs 04/06/2022 and earlier. Chest CT 03/13/2022. FINDINGS: Mild cardiomegaly. Aortic atherosclerosis. Central pulmonary vascular congestion. Although improved from the prior chest radiograph of 04/06/2022, there is a band like opacity within the perihilar right lung and ill-defined opacity within the adjacent medial right lung base. No overt pulmonary edema. Unchanged elevation of the right hemidiaphragm. No evidence of pleural effusion or pneumothorax. No acute bony abnormality identified. IMPRESSION: Cardiomegaly with central pulmonary vascular congestion. No overt pulmonary edema. Although improved from the prior chest  radiograph of 04/06/2022, there is a band like opacity within the perihilar right lung and ill-defined opacity within the adjacent medial right lung base. The appearance favors atelectasis. However, superimposed pneumonia cannot be excluded and clinical correlation is recommended. Aortic Atherosclerosis (ICD10-I70.0). Electronically Signed   By: Jackey Loge D.O.   On: 04/07/2022 10:01   CT HEAD WO CONTRAST ( )  Result Date: 04/06/2022 CLINICAL DATA:  Delirium. EXAM: CT HEAD WITHOUT CONTRAST TECHNIQUE: Contiguous axial images were obtained from the base of the skull through the vertex without intravenous contrast. RADIATION DOSE REDUCTION: This exam was performed according to the departmental dose-optimization program which includes automated exposure control, adjustment of the mA and/or kV according to patient size and/or use of iterative reconstruction technique. COMPARISON:  11/05/2021. FINDINGS: Brain: No acute intracranial hemorrhage, midline shift or mass effect. No extra-axial fluid collection. Generalized atrophy is noted. Gray-white matter differentiation is noted. No hydrocephalus. Vascular: No hyperdense vessel or unexpected calcification. Skull: Normal. Negative for fracture or focal lesion. Sinuses/Orbits: No acute finding. Other: Increased density is noted in the scalp over the frontal bone on the left. IMPRESSION: 1. No acute intracranial hemorrhage. 2. Atrophy. 3. Increased density over the scalp over the frontal bone on the left, possible contusion or edema. Correlation with physical exam is recommended. Electronically Signed   By: Charlestine Night.D.  On: 04/06/2022 22:22   DG Chest Port 1 View  Result Date: 04/06/2022 CLINICAL DATA:  Shortness of breath EXAM: PORTABLE CHEST 1 VIEW COMPARISON:  Portable exam 1739 hours compared to 03/13/2022 FINDINGS: Enlargement of cardiac silhouette with pulmonary vascular congestion. Atherosclerotic calcification aorta. Decreased subsegmental  atelectasis RIGHT base. No definite infiltrate, pleural effusion, or pneumothorax. Chronic elevation of RIGHT diaphragm. Osseous structures unremarkable. IMPRESSION: Enlargement of cardiac silhouette with pulmonary vascular congestion. Chronic elevation of RIGHT diaphragm with slightly decreased RIGHT basilar atelectasis. Aortic Atherosclerosis (ICD10-I70.0). Electronically Signed   By: Ulyses Southward M.D.   On: 04/06/2022 17:55    ROS: As per HPI otherwise negative.   Physical Exam: Vitals:   04/07/22 1130 04/07/22 1145 04/07/22 1150 04/07/22 1200  BP: (!) 165/92 (!) 169/105  (!) 165/91  Pulse: 84 83 85 81  Resp: (!) 23 (!) 25 (!) 33 (!) 30  Temp:    98.7 F (37.1 C)  TempSrc:    Axillary  SpO2: 95% 95% 96% 94%  Weight:      Height:         General: Chronically ill appearing male on BIPAP.  Head: Normocephalic, atraumatic, sclera non-icteric, mucus membranes are moist Neck: Supple. JVD elevated.  Lungs: Bilateral breath sound with bibasilar crackles, scattered rhonchi. On BIPAP.  Heart: SR S1,S2 RRR No MI/R/G Abdomen: NABS, NT, ND Lower extremities:Ankle edema bilaterally Neuro: Alert and oriented X 2. Moves all extremities spontaneously. Psych:  Initially refused to talk but now answering questions, appears at base line.  Dialysis Access: L AVF + T/B  Dialysis Orders: East, T,Th,S 3:45 hrs 180NRe 450/Autoflow 1.5 71 kg 2.0 K/2.0 Ca UFP 4 AVF -No heparin -Hectorol 1 mcg IV TIW -Venofer 100 mg IV weekly   Assessment/Plan: Acute hypoxic respiratory failure-per PCCM Flash pulmonary edema-had HD earlier this AM. Net UF 4 liters now appears wet, requiring BIPAP, hypertensive. Will have urgent HD again today. This will be 3rd HD in less than 24 hours. Concerned he may have other process occurring that is contributing-consider Mild hyperkalemia-present on admission, now resolved HFpEF -G2DD. EF 50-55% slightly reduced RV function. Attempt to optimize volume with HD.   ESRD -  T,Th,S  HD today again off schedule. This will be 3rd HD in less than 24 hours. Will assess HD needs tomorrow.   Hypertension/volume  - As noted above. Home meds resumed. UF as tolerated.   Anemia  - HGB 12.3 No ESA as OP. Follow labs.   Metabolic bone disease -  Caclium PO4 at goal. Continue binders, VDRA  Nutrition - NPO at present. Albumin OK.   Rita H. Manson Passey, NP-C 04/07/2022, 12:40 PM  [employer] [phone]   Seen and examined independently this morning.  Agree with note and exam as documented above by physician extender and as noted here.  Mr. Metter is a 60 year old male with a history of ESRD on hemodialysis TTS at [facility], polysubstance abuse, hepatitis C, and heart failure with preserved ejection fraction who presented to the hospital with shortness of breath.  He was started on BiPAP and has at intervals refused interventions such as the bipap.  He had dialysis overnight ending early this morning with 4 L removed.  Critical care called me later this morning to let us know that his respiratory status had deteriorated.  He was on BiPAP at the time of my exam and did not answer questions for me.  He has left dialysis early on multiple occasions recently making optimizing  his volume a challenge.    General adult male in bed on bipap head to the side and working to breathe HEENT normocephalic atraumatic sclera anicteric Neck supple trachea midline Lungs rhonchi and transmitted upper airway sounds on bipap  Heart S1S2 no rub but heart sounds obscured with bipap Abdomen soft nontender nondistended Extremities no pitting edema appreciated; no cyanosis or clubbing  Psych patient is anxious Neuro awake but closes eyes and did not respond to questions on my exam Access LUE AVF bruit  Entry for care provided earlier today.   In the interim he has received additional HD as ordered and improved per primary team.  Note he is now refusing to wear bipap.   # Acute  hypoxic respiratory failure  - optimizing volume status with HD  - work-up for additional causes aside from overload per primary team   # ESRD  - s/p HD overnight and again today  - Plan for HD tomorrow to keep him on TTS schedule  # Heart failure with preserved EF, acute exacerbation/flash pulm edema - optimize volume status with HD  # HTN  - optimize volume status with HD - nitro is also being used; SL nitro for flash pulm edema - continue current regimen   # Anemia CKD  - Hb at/above goal - no ESA indicated at this time  # Metabolic bone disease  - continue outpatient regimen    Disposition - continue inpatient monitoring   Estanislado Emms, MD 04/07/2022  6:33 PM

## 2022-04-08 ENCOUNTER — Other Ambulatory Visit: Payer: Self-pay

## 2022-04-08 LAB — RENAL FUNCTION PANEL
Albumin: 3 g/dL — ABNORMAL LOW (ref 3.5–5.0)
Anion gap: 10 (ref 5–15)
BUN: 26 mg/dL — ABNORMAL HIGH (ref 8–23)
CO2: 28 mmol/L (ref 22–32)
Calcium: 8.7 mg/dL — ABNORMAL LOW (ref 8.9–10.3)
Chloride: 96 mmol/L — ABNORMAL LOW (ref 98–111)
Creatinine, Ser: 3.5 mg/dL — ABNORMAL HIGH (ref 0.61–1.24)
GFR, Estimated: 19 mL/min — ABNORMAL LOW (ref >60)
Glucose, Bld: 93 mg/dL (ref 70–99)
Phosphorus: 4.2 mg/dL (ref 2.5–4.6)
Potassium: 4 mmol/L (ref 3.5–5.1)
Sodium: 134 mmol/L — ABNORMAL LOW (ref 135–145)

## 2022-04-08 LAB — CBC
HCT: 39.5 % (ref 39.0–52.0)
Hemoglobin: 12.5 g/dL — ABNORMAL LOW (ref 13.0–17.0)
MCH: 26.4 pg (ref 26.0–34.0)
MCHC: 31.6 g/dL (ref 30.0–36.0)
MCV: 83.3 fL (ref 80.0–100.0)
Platelets: 190 10*3/uL (ref 150–400)
RBC: 4.74 MIL/uL (ref 4.22–5.81)
RDW: 21.4 % — ABNORMAL HIGH (ref 11.5–15.5)
WBC: 6.7 10*3/uL (ref 4.0–10.5)
nRBC: 0 % (ref 0.0–0.2)

## 2022-04-08 LAB — HEPATITIS B SURFACE ANTIBODY, QUANTITATIVE: Hep B S AB Quant (Post): 67.1 m[IU]/mL (ref >9.9)

## 2022-04-08 MED ORDER — IPRATROPIUM-ALBUTEROL 0.5-2.5 (3) MG/3ML IN SOLN
3.0000 mL | Freq: Four times a day (QID) | RESPIRATORY_TRACT | Status: DC
  Administered 2022-04-08 – 2022-04-09 (×4): 3 mL via RESPIRATORY_TRACT
  Filled 2022-04-08 (×4): qty 3

## 2022-04-08 MED ORDER — PREDNISONE 20 MG PO TABS
40.0000 mg | ORAL_TABLET | Freq: Every day | ORAL | Status: DC
  Administered 2022-04-08 – 2022-04-09 (×2): 40 mg via ORAL
  Filled 2022-04-08 (×3): qty 2

## 2022-04-08 MED ORDER — DOXERCALCIFEROL 4 MCG/2ML IV SOLN
1.0000 ug | INTRAVENOUS | Status: DC
  Administered 2022-04-08: 1 ug via INTRAVENOUS
  Filled 2022-04-08 (×2): qty 2

## 2022-04-08 MED ORDER — SEVELAMER CARBONATE 800 MG PO TABS
800.0000 mg | ORAL_TABLET | Freq: Three times a day (TID) | ORAL | Status: DC
  Administered 2022-04-08 – 2022-04-09 (×5): 800 mg via ORAL
  Filled 2022-04-08 (×5): qty 1

## 2022-04-08 MED ORDER — OXYCODONE HCL 5 MG PO TABS
5.0000 mg | ORAL_TABLET | Freq: Three times a day (TID) | ORAL | Status: DC | PRN
  Administered 2022-04-08 – 2022-04-09 (×4): 5 mg via ORAL
  Filled 2022-04-08 (×4): qty 1

## 2022-04-08 MED ORDER — HYDRALAZINE HCL 50 MG PO TABS
100.0000 mg | ORAL_TABLET | Freq: Four times a day (QID) | ORAL | Status: DC
  Administered 2022-04-08 – 2022-04-09 (×5): 100 mg via ORAL
  Filled 2022-04-08 (×5): qty 2

## 2022-04-08 MED ORDER — NICOTINE 14 MG/24HR TD PT24
14.0000 mg | MEDICATED_PATCH | Freq: Every day | TRANSDERMAL | Status: DC
  Filled 2022-04-08: qty 1

## 2022-04-08 MED ORDER — ACETAMINOPHEN 325 MG PO TABS
650.0000 mg | ORAL_TABLET | Freq: Four times a day (QID) | ORAL | Status: DC | PRN
Start: 2022-04-08 — End: 2022-04-09
  Administered 2022-04-08 (×2): 650 mg via ORAL
  Filled 2022-04-08 (×2): qty 2

## 2022-04-08 MED ORDER — SEVELAMER CARBONATE 800 MG PO TABS: 1600.0000 mg | ORAL_TABLET | Freq: Three times a day (TID) | ORAL | Status: DC

## 2022-04-08 MED ORDER — DIPHENHYDRAMINE HCL 25 MG PO CAPS
25.0000 mg | ORAL_CAPSULE | Freq: Three times a day (TID) | ORAL | Status: DC | PRN
Start: 2022-04-08 — End: 2022-04-09
  Administered 2022-04-08 (×2): 25 mg via ORAL
  Filled 2022-04-08 (×2): qty 1

## 2022-04-09 DIAGNOSIS — J441 Chronic obstructive pulmonary disease with (acute) exacerbation: Secondary | ICD-10-CM | POA: Diagnosis not present

## 2022-04-09 DIAGNOSIS — I5033 Acute on chronic diastolic (congestive) heart failure: Secondary | ICD-10-CM | POA: Diagnosis not present

## 2022-04-09 DIAGNOSIS — N186 End stage renal disease: Secondary | ICD-10-CM | POA: Diagnosis not present

## 2022-04-09 DIAGNOSIS — J9601 Acute respiratory failure with hypoxia: Secondary | ICD-10-CM | POA: Diagnosis not present

## 2022-04-09 DIAGNOSIS — I169 Hypertensive crisis, unspecified: Secondary | ICD-10-CM

## 2022-04-09 MED ORDER — PREDNISONE 20 MG PO TABS: 40.0000 mg | ORAL_TABLET | Freq: Every day | ORAL | 0 refills | Status: DC

## 2022-04-11 ENCOUNTER — Encounter: Payer: Self-pay | Admitting: *Deleted

## 2022-04-12 ENCOUNTER — Encounter: Payer: Self-pay | Admitting: Vascular Surgery

## 2022-04-12 ENCOUNTER — Ambulatory Visit (INDEPENDENT_AMBULATORY_CARE_PROVIDER_SITE_OTHER): Payer: Medicare Other | Admitting: Vascular Surgery

## 2022-04-12 ENCOUNTER — Other Ambulatory Visit: Payer: Self-pay

## 2022-04-12 VITALS — BP 152/76 | HR 72 | Temp 98.4°F | Resp 20 | Ht 73.0 in | Wt 149.4 lb

## 2022-04-12 DIAGNOSIS — N186 End stage renal disease: Secondary | ICD-10-CM

## 2022-04-12 NOTE — Progress Notes (Signed)
Patient ID: Timothy [name], male   DOB: [birth year], 61 y.o.   MRN: [id number]  Reason for Consult: New Patient (Initial Visit) (LUE AVF Aneurysm)   Referred by [name], MD  Subjective:     HPI:  Marce Charlesworth is a 61 y.o. male history of end-stage renal disease currently on dialysis Tuesdays, Thursdays and Saturdays via left upper extremity AV fistula.  He has had some prolonged bleeding after dialysis.  He did undergo fistulogram back in February with no intervention.  States that he takes no blood thinners.  He denies any recent interventions to the fistula itself.  He has not had any fevers or chills or bleeding without dialysis  Past Medical History:  Diagnosis Date   Acute metabolic encephalopathy 05/29/4817   Anemia of chronic kidney failure    BPH (benign prostatic hyperplasia)    Colon cancer ([place]) 2014   spouse states he had surgury for colon CA   End stage renal disease on dialysis ([place]) 2017   Hepatitis C    Hypertension    Hypertensive crisis 04/07/2022   Hypertensive heart disease with chronic diastolic congestive heart failure ([place]) 07/17/2016   Polysubstance abuse ([place])    History of heroin and marijuana use   Family History  Problem Relation Age of Onset   Heart failure Mother        Died at age 71.   Heart attack Mother 104   Hypertension Mother    Diabetes Mellitus II Mother    Other Father        Unknown   Kidney failure Sister        (Oldest Sister)   Other Other        Multiple siblings have started her heart disease, he is not sure of the details.   CAD Nephew    Past Surgical History:  Procedure Laterality Date   A/V FISTULAGRAM Left 12/02/2021   Procedure: A/V Fistulagram;  Surgeon: Cherre Robins, MD;  Location: [place] [facility];  Service: Cardiovascular;  Laterality: Left;   ABDOMINAL SURGERY     AV FISTULA PLACEMENT Left 09/19/2016   Procedure: Left arm Radiocephalic ARTERIOVENOUS (AV) FISTULA CREATION;  Surgeon: Conrad Dora,  MD;  Location: [place];  Service: Vascular;  Laterality: Left;   [place] Left 07/09/2017   Procedure: BRACHIOCEPHALIC FISTULA CREATION;  Surgeon: Conrad Blackwell, MD;  Location: [place];  Service: Vascular;  Laterality: Left;   COLON SURGERY  2014   ESOPHAGOGASTRODUODENOSCOPY (EGD) WITH PROPOFOL N/A 11/05/2021   Procedure: ESOPHAGOGASTRODUODENOSCOPY (EGD) WITH PROPOFOL;  Surgeon: Carol Ada, MD;  Location: [place];  Service: Endoscopy;  Laterality: N/A;   ESOPHAGOGASTRODUODENOSCOPY (EGD) WITH PROPOFOL N/A 11/14/2021   Procedure: ESOPHAGOGASTRODUODENOSCOPY (EGD) WITH PROPOFOL;  Surgeon: [name], MD;  Location: [place];  Service: Gastroenterology;  Laterality: N/A;   ESOPHAGOGASTRODUODENOSCOPY (EGD) WITH PROPOFOL N/A 11/16/2021   Procedure: ESOPHAGOGASTRODUODENOSCOPY (EGD) WITH PROPOFOL;  Surgeon: [name], MD;  Location: [place];  Service: Gastroenterology;  Laterality: N/A;   HEMOSTASIS CLIP PLACEMENT  11/16/2021   Procedure: HEMOSTASIS CLIP PLACEMENT;  Surgeon: [name], MD;  Location: [facility];  Service: Gastroenterology;;   HEMOSTASIS CONTROL  11/05/2021   Procedure: HEMOSTASIS CONTROL;  Surgeon: Carol Ada, MD;  Location: [place];  Service: Endoscopy;;   HEMOSTASIS CONTROL  11/16/2021   Procedure: HEMOSTASIS CONTROL;  Surgeon: [name], MD;  Location: [place];  Service: Gastroenterology;;   HOT HEMOSTASIS N/A 11/16/2021   Procedure:  HOT HEMOSTASIS (ARGON PLASMA COAGULATION/BICAP);  Surgeon: [name], MD;  Location: [place];  Service: Gastroenterology;  Laterality: N/A;   INSERTION OF DIALYSIS CATHETER N/A 09/19/2016   Procedure: INSERTION OF TUNNELED DIALYSIS CATHETER;  Surgeon: Conrad [place], MD;  Location: [place];  Service: Vascular;  Laterality: N/A;   IR ANGIOGRAM SELECTIVE EACH ADDITIONAL VESSEL  11/16/2021   IR ANGIOGRAM VISCERAL SELECTIVE  11/05/2021   IR ANGIOGRAM VISCERAL SELECTIVE  11/16/2021   IR ANGIOGRAM VISCERAL  SELECTIVE  11/16/2021   IR EMBO ART  VEN HEMORR LYMPH EXTRAV  INC GUIDE ROADMAPPING  11/05/2021   IR EMBO ART  VEN HEMORR LYMPH EXTRAV  INC GUIDE ROADMAPPING  11/16/2021   IR GENERIC HISTORICAL  09/14/2016   IR US GUIDE VASC ACCESS RIGHT 09/14/2016 [name], DO [facility]   IR GENERIC HISTORICAL  09/14/2016   IR FLUORO GUIDE CV LINE RIGHT 09/14/2016 [name], DO [facility]   IR PARACENTESIS  06/22/2021   IR US GUIDE [place] RIGHT  11/05/2021   IR US GUIDE [place] RIGHT  11/16/2021   LAPAROTOMY N/A 05/23/2021   Procedure: EXPLORATORY LAPAROTOMY LYSIS ADHESIONS;  Surgeon: [name], MD;  Location: [place];  Service: General;  Laterality: N/A;   LAPAROTOMY N/A 11/20/2021   Procedure: EXPLORATORY LAPAROTOMY, REPAIR OF BLEEDING DUODENAL ULCER;  Surgeon: Dwan Bolt, MD;  Location: [place];  Service: General;  Laterality: N/A;   ORIF TIBIA PLATEAU Left 01/15/2018   Procedure: OPEN REDUCTION INTERNAL FIXATION (ORIF) TIBIAL PLATEAU;  Surgeon: Altamese [place], MD;  Location: [place];  Service: Orthopedics;  Laterality: Left;   SCLEROTHERAPY  11/05/2021   Procedure: Clide Deutscher;  Surgeon: Carol Ada, MD;  Location: [place];  Service: Endoscopy;;   SCLEROTHERAPY  11/16/2021   Procedure: Clide Deutscher;  Surgeon: [name], MD;  Location: [facility];  Service: Gastroenterology;;   TRANSTHORACIC ECHOCARDIOGRAM  07/2016    EF 60-65%, No RWMA. Mod Concentric LVH - Gr 2 DD. Severe LA dilation. PAP ~35 mmHg (mild Pulm HTN)  --> no changes noted 1 month later    Short Social History:  Social History   Tobacco Use   Smoking status: Some Days    Packs/day: 0.25    Types: Cigarettes    Passive exposure: Never   Smokeless tobacco: Never  Substance Use Topics   Alcohol use: No    Alcohol/week: 7.0 standard drinks of alcohol    Types: 7 Cans of beer per week    No Known Allergies  Current Outpatient Medications  Medication Sig Dispense Refill   albuterol (VENTOLIN HFA) 108  (90 Base) MCG/ACT inhaler Inhale 2 puffs into the lungs every 6 (six) hours as needed for wheezing or shortness of breath.     carvedilol (COREG) 3.125 MG tablet Take 3.125 mg by mouth 2 (two) times daily with a meal.     diltiazem (TIAZAC) 360 MG 24 hr capsule Take 360 mg by mouth daily.     doxycycline (VIBRA-TABS) 100 MG tablet Take 100 mg by mouth 2 (two) times daily.     DULoxetine (CYMBALTA) 20 MG capsule Take 20 mg by mouth daily.     hydrALAZINE (APRESOLINE) 25 MG tablet Take 1 tablet (25 mg total) by mouth every 6 (six) hours. 90 tablet 3   LIDOCAINE-PRILOCAINE EX Apply 1 application  topically Every Tuesday,Thursday,and Saturday with dialysis.     multivitamin (RENA-VIT) TABS tablet Take 1 tablet by mouth at bedtime. 30 tablet 3   oxyCODONE (  OXY IR/ROXICODONE) 5 MG immediate release tablet Take 5 mg by mouth every 8 (eight) hours as needed for pain or severe pain.     predniSONE (DELTASONE) 20 MG tablet Take 2 tablets (40 mg total) by mouth daily with breakfast. 4 tablet 0   sevelamer carbonate (RENVELA) 800 MG tablet Take 1,600 mg by mouth 3 (three) times daily with meals.     No current facility-administered medications for this visit.    Review of Systems  Constitutional:  Constitutional negative. HENT: HENT negative.  Eyes: Eyes negative.  Respiratory: Respiratory negative.  Cardiovascular: Cardiovascular negative.  GI: Gastrointestinal negative.  Musculoskeletal: Musculoskeletal negative.  Skin: Skin negative.  Hematologic:       Prolonged bleeding left arm AV fistula after dialysis Psychiatric: Psychiatric negative.        Objective:  Objective   Vitals:   04/12/22 0911  BP: (!) 152/76  Pulse: 72  Resp: 20  Temp: 98.4 F (36.9 C)  TempSrc: Temporal  SpO2: 90%  Weight: 149 lb 6.4 oz (67.8 kg)  Height: '6\' 1"'$  (1.854 m)   Body mass index is 19.71 kg/m.  Physical Exam HENT:     Head: Normocephalic.     Nose: Nose normal.     Mouth/Throat:     Mouth:  Mucous membranes are moist.  Eyes:     Pupils: Pupils are equal, round, and reactive to light.  Cardiovascular:     Pulses:          Radial pulses are 2+ on the left side.  Pulmonary:     Effort: Pulmonary effort is normal.  Abdominal:     General: Abdomen is flat.     Palpations: Abdomen is soft.  Skin:    Capillary Refill: Capillary refill takes less than 2 seconds.     Comments: 2 areas of pseudoaneurysmal ulceration overlying the upper arm AV fistula in the left  Neurological:     Mental Status: He is alert.  Psychiatric:        Mood and Affect: Mood normal.     Data: Previous fistulogram reviewed.     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  I recommended repeat fistulogram and then will possibly need surgical revision of the pseudoaneurysmal areas.  We will get this set up on a nondialysis day in the near future.  Patient demonstrates good understanding.     Waynetta Sandy MD [facility]

## 2022-04-12 NOTE — H&P (View-Only) (Signed)
Patient ID: Timothy [name], male   DOB: June 04, 1961, 61 y.o.   MRN: [id number]  Reason for Consult: New Patient (Initial Visit) (LUE AVF Aneurysm)   Referred by [name], MD  Subjective:     HPI:  Timothy [name] is a 61 y.o. male history of end-stage renal disease currently on dialysis Tuesdays, Thursdays and Saturdays via left upper extremity AV fistula.  He has had some prolonged bleeding after dialysis.  He did undergo fistulogram back in February with no intervention.  States that he takes no blood thinners.  He denies any recent interventions to the fistula itself.  He has not had any fevers or chills or bleeding without dialysis  Past Medical History:  Diagnosis Date   Acute metabolic encephalopathy 1/74/9449   Anemia of chronic kidney failure    BPH (benign prostatic hyperplasia)    Colon cancer ([place]) 2014   spouse states he had surgury for colon CA   End stage renal disease on dialysis ([place]) 2017   Hepatitis C    Hypertension    Hypertensive crisis 04/07/2022   Hypertensive heart disease with chronic diastolic congestive heart failure ([place]) 07/17/2016   Polysubstance abuse ([place])    History of heroin and marijuana use   Family History  Problem Relation Age of Onset   Heart failure Mother        Died at age 36.   Heart attack Mother 51   Hypertension Mother    Diabetes Mellitus II Mother    Other Father        Unknown   Kidney failure Sister        (Oldest Sister)   Other Other        Multiple siblings have started her heart disease, he is not sure of the details.   CAD Nephew    Past Surgical History:  Procedure Laterality Date   A/V FISTULAGRAM Left 12/02/2021   Procedure: A/V Fistulagram;  Surgeon: Cherre Robins, MD;  Location: [place] [facility];  Service: Cardiovascular;  Laterality: Left;   ABDOMINAL SURGERY     AV FISTULA PLACEMENT Left 09/19/2016   Procedure: Left arm Radiocephalic ARTERIOVENOUS (AV) FISTULA CREATION;  Surgeon: Conrad [place],  MD;  Location: [place];  Service: Vascular;  Laterality: Left;   [place] Left 07/09/2017   Procedure: BRACHIOCEPHALIC FISTULA CREATION;  Surgeon: Conrad [place], MD;  Location: [place];  Service: Vascular;  Laterality: Left;   COLON SURGERY  2014   ESOPHAGOGASTRODUODENOSCOPY (EGD) WITH PROPOFOL N/A 11/05/2021   Procedure: ESOPHAGOGASTRODUODENOSCOPY (EGD) WITH PROPOFOL;  Surgeon: Carol Ada, MD;  Location: [place];  Service: Endoscopy;  Laterality: N/A;   ESOPHAGOGASTRODUODENOSCOPY (EGD) WITH PROPOFOL N/A 11/14/2021   Procedure: ESOPHAGOGASTRODUODENOSCOPY (EGD) WITH PROPOFOL;  Surgeon: [name], MD;  Location: [place];  Service: Gastroenterology;  Laterality: N/A;   ESOPHAGOGASTRODUODENOSCOPY (EGD) WITH PROPOFOL N/A 11/16/2021   Procedure: ESOPHAGOGASTRODUODENOSCOPY (EGD) WITH PROPOFOL;  Surgeon: [name], MD;  Location: [place];  Service: Gastroenterology;  Laterality: N/A;   HEMOSTASIS CLIP PLACEMENT  11/16/2021   Procedure: HEMOSTASIS CLIP PLACEMENT;  Surgeon: [name], MD;  Location: [facility] ENDOSCOPY;  Service: Gastroenterology;;   HEMOSTASIS CONTROL  11/05/2021   Procedure: HEMOSTASIS CONTROL;  Surgeon: Carol Ada, MD;  Location: [place];  Service: Endoscopy;;   HEMOSTASIS CONTROL  11/16/2021   Procedure: HEMOSTASIS CONTROL;  Surgeon: [name], MD;  Location: [place];  Service: Gastroenterology;;   HOT HEMOSTASIS N/A 11/16/2021   Procedure:  HOT HEMOSTASIS (ARGON PLASMA COAGULATION/BICAP);  Surgeon: [name], MD;  Location: [place];  Service: Gastroenterology;  Laterality: N/A;   INSERTION OF DIALYSIS CATHETER N/A 09/19/2016   Procedure: INSERTION OF TUNNELED DIALYSIS CATHETER;  Surgeon: Conrad [place], MD;  Location: [place];  Service: Vascular;  Laterality: N/A;   IR ANGIOGRAM SELECTIVE EACH ADDITIONAL VESSEL  11/16/2021   IR ANGIOGRAM VISCERAL SELECTIVE  11/05/2021   IR ANGIOGRAM VISCERAL SELECTIVE  11/16/2021   IR ANGIOGRAM VISCERAL  SELECTIVE  11/16/2021   IR EMBO ART  VEN HEMORR LYMPH EXTRAV  INC GUIDE ROADMAPPING  11/05/2021   IR EMBO ART  VEN HEMORR LYMPH EXTRAV  INC GUIDE ROADMAPPING  11/16/2021   IR GENERIC HISTORICAL  09/14/2016   IR US GUIDE VASC ACCESS RIGHT 09/14/2016 [name], DO [facility]   IR GENERIC HISTORICAL  09/14/2016   IR FLUORO GUIDE CV LINE RIGHT 09/14/2016 [name], DO [facility]   IR PARACENTESIS  06/22/2021   IR US GUIDE [place] RIGHT  11/05/2021   IR US GUIDE [place] RIGHT  11/16/2021   LAPAROTOMY N/A 05/23/2021   Procedure: EXPLORATORY LAPAROTOMY LYSIS ADHESIONS;  Surgeon: [name], MD;  Location: [place];  Service: General;  Laterality: N/A;   LAPAROTOMY N/A 11/20/2021   Procedure: EXPLORATORY LAPAROTOMY, REPAIR OF BLEEDING DUODENAL ULCER;  Surgeon: Dwan Bolt, MD;  Location: [place];  Service: General;  Laterality: N/A;   ORIF TIBIA PLATEAU Left 01/15/2018   Procedure: OPEN REDUCTION INTERNAL FIXATION (ORIF) TIBIAL PLATEAU;  Surgeon: Altamese [place], MD;  Location: [place];  Service: Orthopedics;  Laterality: Left;   SCLEROTHERAPY  11/05/2021   Procedure: Clide Deutscher;  Surgeon: Carol Ada, MD;  Location: [place];  Service: Endoscopy;;   SCLEROTHERAPY  11/16/2021   Procedure: Clide Deutscher;  Surgeon: [name], MD;  Location[facility] END[facility]COPY;  Service: Gastroenterology;;   TRANSTHORACIC ECHOCARDIOGRAM  07/2016    EF 60-65%, No RWMA. Mod Concentric LVH - Gr 2 DD. Severe LA dilation. PAP ~35 mmHg (mild Pulm HTN)  --> no changes noted 1 month later    Short Social History:  Social History   Tobacco Use   Smoking status: Some Days    Packs/day: 0.25    Types: Cigarettes    Passive exposure: Never   Smokeless tobacco: Never  Substance Use Topics   Alcohol use: No    Alcohol/week: 7.0 standard drinks of alcohol    Types: 7 Cans of beer per week    No Known Allergies  Current Outpatient Medications  Medication Sig Dispense Refill   albuterol (VENTOLIN HFA) 108  (90 Base) MCG/ACT inhaler Inhale 2 puffs into the lungs every 6 (six) hours as needed for wheezing or shortness of breath.     carvedilol (COREG) 3.125 MG tablet Take 3.125 mg by mouth 2 (two) times daily with a meal.     diltiazem (TIAZAC) 360 MG 24 hr capsule Take 360 mg by mouth daily.     doxycycline (VIBRA-TABS) 100 MG tablet Take 100 mg by mouth 2 (two) times daily.     DULoxetine (CYMBALTA) 20 MG capsule Take 20 mg by mouth daily.     hydrALAZINE (APRESOLINE) 25 MG tablet Take 1 tablet (25 mg total) by mouth every 6 (six) hours. 90 tablet 3   LIDOCAINE-PRILOCAINE EX Apply 1 application  topically Every Tuesday,Thursday,and Saturday with dialysis.     multivitamin (RENA-VIT) TABS tablet Take 1 tablet by mouth at bedtime. 30 tablet 3   oxyCODONE (  OXY IR/ROXICODONE) 5 MG immediate release tablet Take 5 mg by mouth every 8 (eight) hours as needed for pain or severe pain.     predniSONE (DELTASONE) 20 MG tablet Take 2 tablets (40 mg total) by mouth daily with breakfast. 4 tablet 0   sevelamer carbonate (RENVELA) 800 MG tablet Take 1,600 mg by mouth 3 (three) times daily with meals.     No current facility-administered medications for this visit.    Review of Systems  Constitutional:  Constitutional negative. HENT: HENT negative.  Eyes: Eyes negative.  Respiratory: Respiratory negative.  Cardiovascular: Cardiovascular negative.  GI: Gastrointestinal negative.  Musculoskeletal: Musculoskeletal negative.  Skin: Skin negative.  Hematologic:       Prolonged bleeding left arm AV fistula after dialysis Psychiatric: Psychiatric negative.        Objective:  Objective   Vitals:   04/12/22 0911  BP: (!) 152/76  Pulse: 72  Resp: 20  Temp: 98.4 F (36.9 C)  TempSrc: Temporal  SpO2: 90%  Weight: 149 lb 6.4 oz (67.8 kg)  Height: '6\' 1"'$  (1.854 m)   Body mass index is 19.71 kg/m.  Physical Exam HENT:     Head: Normocephalic.     Nose: Nose normal.     Mouth/Throat:     Mouth:  Mucous membranes are moist.  Eyes:     Pupils: Pupils are equal, round, and reactive to light.  Cardiovascular:     Pulses:          Radial pulses are 2+ on the left side.  Pulmonary:     Effort: Pulmonary effort is normal.  Abdominal:     General: Abdomen is flat.     Palpations: Abdomen is soft.  Skin:    Capillary Refill: Capillary refill takes less than 2 seconds.     Comments: 2 areas of pseudoaneurysmal ulceration overlying the upper arm AV fistula in the left  Neurological:     Mental Status: He is alert.  Psychiatric:        Mood and Affect: Mood normal.     Data: Previous fistulogram reviewed.     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  I recommended repeat fistulogram and then will possibly need surgical revision of the pseudoaneurysmal areas.  We will get this set up on a nondialysis day in the near future.  Patient demonstrates good understanding.     Waynetta Sandy MD [facility]

## 2022-04-24 ENCOUNTER — Encounter (HOSPITAL_COMMUNITY): Payer: Self-pay | Admitting: Vascular Surgery

## 2022-04-24 ENCOUNTER — Ambulatory Visit (HOSPITAL_COMMUNITY)
Admission: RE | Admit: 2022-04-24 | Discharge: 2022-04-24 | Disposition: A | Discharge status: Home / Self Care | Payer: Medicare Other | Attending: Vascular Surgery | Admitting: Vascular Surgery

## 2022-04-24 ENCOUNTER — Encounter (HOSPITAL_COMMUNITY)
Admission: RE | Disposition: A | Discharge status: Home / Self Care | Payer: Self-pay | Source: Home / Self Care | Attending: Vascular Surgery

## 2022-04-24 ENCOUNTER — Other Ambulatory Visit: Payer: Self-pay

## 2022-04-24 DIAGNOSIS — N186 End stage renal disease: Secondary | ICD-10-CM

## 2022-04-24 DIAGNOSIS — Z992 Dependence on renal dialysis: Secondary | ICD-10-CM | POA: Insufficient documentation

## 2022-04-24 DIAGNOSIS — T82898A Other specified complication of vascular prosthetic devices, implants and grafts, initial encounter: Secondary | ICD-10-CM | POA: Insufficient documentation

## 2022-04-24 DIAGNOSIS — N185 Chronic kidney disease, stage 5: Secondary | ICD-10-CM

## 2022-04-24 DIAGNOSIS — Y841 Kidney dialysis as the cause of abnormal reaction of the patient, or of later complication, without mention of misadventure at the time of the procedure: Secondary | ICD-10-CM | POA: Diagnosis not present

## 2022-04-24 DIAGNOSIS — F17210 Nicotine dependence, cigarettes, uncomplicated: Secondary | ICD-10-CM | POA: Diagnosis not present

## 2022-04-24 DIAGNOSIS — I132 Hypertensive heart and chronic kidney disease with heart failure and with stage 5 chronic kidney disease, or end stage renal disease: Secondary | ICD-10-CM | POA: Insufficient documentation

## 2022-04-24 DIAGNOSIS — I5032 Chronic diastolic (congestive) heart failure: Secondary | ICD-10-CM | POA: Diagnosis not present

## 2022-04-24 DIAGNOSIS — D631 Anemia in chronic kidney disease: Secondary | ICD-10-CM | POA: Insufficient documentation

## 2022-04-24 HISTORY — PX: A/V FISTULAGRAM: CATH118298

## 2022-04-24 LAB — POCT I-STAT, CHEM 8
BUN: 71 mg/dL — ABNORMAL HIGH (ref 8–23)
Calcium, Ion: 1.05 mmol/L — ABNORMAL LOW (ref 1.15–1.40)
Chloride: 96 mmol/L — ABNORMAL LOW (ref 98–111)
Creatinine, Ser: 7.6 mg/dL — ABNORMAL HIGH (ref 0.61–1.24)
Glucose, Bld: 79 mg/dL (ref 70–99)
HCT: 35 % — ABNORMAL LOW (ref 39.0–52.0)
Hemoglobin: 11.9 g/dL — ABNORMAL LOW (ref 13.0–17.0)
Potassium: 5.1 mmol/L (ref 3.5–5.1)
Sodium: 136 mmol/L (ref 135–145)
TCO2: 28 mmol/L (ref 22–32)

## 2022-04-24 SURGERY — A/V FISTULAGRAM
Anesthesia: LOCAL | Laterality: Left

## 2022-04-24 MED ORDER — LIDOCAINE HCL (PF) 1 % IJ SOLN
INTRAMUSCULAR | Status: DC | PRN
  Administered 2022-04-24: 2 mL

## 2022-04-24 MED ORDER — HEPARIN (PORCINE) IN NACL 1000-0.9 UT/500ML-% IV SOLN
INTRAVENOUS | Status: DC | PRN
  Administered 2022-04-24: 500 mL

## 2022-04-24 MED ORDER — LIDOCAINE HCL (PF) 1 % IJ SOLN
INTRAMUSCULAR | Status: AC
  Filled 2022-04-24: qty 30

## 2022-04-24 MED ORDER — SODIUM CHLORIDE 0.9% FLUSH: 3.0000 mL | INTRAVENOUS | Status: DC | PRN

## 2022-04-24 MED ORDER — HEPARIN (PORCINE) IN NACL 1000-0.9 UT/500ML-% IV SOLN
INTRAVENOUS | Status: AC
  Filled 2022-04-24: qty 500

## 2022-04-24 MED ORDER — SODIUM CHLORIDE 0.9% FLUSH: 3.0000 mL | Freq: Two times a day (BID) | INTRAVENOUS | Status: DC

## 2022-04-24 MED ORDER — SODIUM CHLORIDE 0.9 % IV SOLN
250.0000 mL | INTRAVENOUS | Status: DC | PRN
  Administered 2022-04-24: 250 mL via INTRAVENOUS

## 2022-04-24 MED ORDER — IODIXANOL 320 MG/ML IV SOLN
INTRAVENOUS | Status: DC | PRN
  Administered 2022-04-24: 35 mL

## 2022-04-24 SURGICAL SUPPLY — 9 items

## 2022-04-24 NOTE — Op Note (Signed)
    Patient name: Timothy [name] MRN: [id number] DOB: 12/28/1960 Sex: male  04/24/2022 Pre-operative Diagnosis: Malfunctioning left arm AV fistula, end-stage renal disease Post-operative diagnosis:  Same Surgeon:  Eda Paschal. Donzetta Matters, MD Procedure Performed: 1.  Ultrasound-guided cannulation left arm AV fistula 2.  Left upper extremity fistulogram  Indications: 61 year old male with end-stage renal disease currently on dialysis via left arm fistula.  He has prolonged bleeding after dialysis has 2 areas of pseudoaneurysmal degeneration.  He is now indicated for fistulogram with possible intervention.  Findings: There are 2 areas of pseudoaneurysm that have significant swelling of blood there is tissue able to be pinched on top of both of these but they do bleed after dialysis.  There is possibly a 50% stenosis in the cephalic arch nothing that appears flow-limiting.  Retrograde angiography demonstrates a patent arteriovenous anastomosis and no intervention was undertaken.  We will plan to revise 1 pseudoaneurysm in the OR on a nondialysis day in the near future he should have plenty of cannulation zone did not require catheter during this time.   Procedure:  The patient was identified in the holding area and taken to room 8.  The patient was then placed supine on the table and prepped and draped in the usual sterile fashion.  A time out was called.  Ultrasound was used to evaluate the left arm AV fistula.  The area was anesthetized 1% lidocaine cannulated micropuncture needle followed by wire and sheath.  Left upper extremity imaging was performed as well as retrograde imaging with a blood pressure cuff in place.  No intervention was undertaken.  The cannulation site was suture-ligated and the sheath was removed.  Plan will be for revision of one of the pseudoaneurysms in the OR on a nondialysis day in the near future.  He tolerated procedure without any complication.  Contrast: 35 cc    Elai Vanwyk C.  Donzetta Matters, MD [facility] [place] Office: [phone] Pager: [phone]

## 2022-04-24 NOTE — Interval H&P Note (Signed)
History and Physical Interval Note:  04/24/2022 8:30 AM  Timothy [name]  has presented today for surgery, with the diagnosis of instage renal.  The various methods of treatment have been discussed with the patient and family. After consideration of risks, benefits and other options for treatment, the patient has consented to  Procedure(s): A/V Fistulagram (Left) as a surgical intervention.  The patient's history has been reviewed, patient examined, no change in status, stable for surgery.  I have reviewed the patient's chart and labs.  Questions were answered to the patient's satisfaction.     Servando Snare

## 2022-04-28 ENCOUNTER — Ambulatory Visit (INDEPENDENT_AMBULATORY_CARE_PROVIDER_SITE_OTHER): Payer: Medicare Other | Admitting: Podiatry

## 2022-04-28 ENCOUNTER — Encounter: Payer: Self-pay | Admitting: Podiatry

## 2022-04-28 DIAGNOSIS — M79675 Pain in left toe(s): Secondary | ICD-10-CM

## 2022-04-28 DIAGNOSIS — B351 Tinea unguium: Secondary | ICD-10-CM

## 2022-04-28 DIAGNOSIS — M79674 Pain in right toe(s): Secondary | ICD-10-CM

## 2022-05-01 ENCOUNTER — Other Ambulatory Visit: Payer: Self-pay

## 2022-05-01 DIAGNOSIS — N186 End stage renal disease: Secondary | ICD-10-CM

## 2022-05-01 NOTE — Progress Notes (Signed)
Subjective:   Patient ID: Timothy [name], male   DOB: 61 y.o.   MRN: [id number]   HPI Patient presents stating he cannot take care of his toenails he is in poor health and he gets discomfort when he tries to wear shoe gear.  Patient does smoke very periodically and does have issues with circulatory status and is not active   Review of Systems  All other systems reviewed and are negative.       Objective:  Physical Exam Vitals and nursing note reviewed.  Constitutional:      Appearance: He is well-developed.  Pulmonary:     Effort: Pulmonary effort is normal.  Musculoskeletal:        General: Normal range of motion.  Skin:    General: Skin is warm.  Neurological:     Mental Status: He is alert.     Vascular status found to be moderately diminished both DP and PT pulses.  Patient does have severe thickening of nailbeds 1-5 both feet with dystrophic changes has history of heart issues and has pain when I palpate the nailbeds.  Patient was noted to have good digit perfusion well oriented     Assessment:  Patient in poor health with circulatory status issues and thick yellow brittle nailbeds 1-5 both feet that he cannot cut     Plan:  H&P educated him on circulatory status and debrided nailbeds 1-5 both feet with no iatrogenic bleeding.  Reappoint for routine care and as needed

## 2022-05-25 ENCOUNTER — Encounter (HOSPITAL_COMMUNITY): Payer: Self-pay | Admitting: Vascular Surgery

## 2022-05-25 ENCOUNTER — Other Ambulatory Visit: Payer: Self-pay

## 2022-05-25 MED ORDER — SODIUM CHLORIDE 0.9 % IV SOLN: INTRAVENOUS | Status: DC

## 2022-05-25 MED ORDER — ACETAMINOPHEN 500 MG PO TABS
1000.0000 mg | ORAL_TABLET | Freq: Once | ORAL | Status: AC
  Administered 2022-05-26: 1000 mg via ORAL
  Filled 2022-05-25: qty 2

## 2022-05-25 MED ORDER — CHLORHEXIDINE GLUCONATE 4 % EX LIQD: 60.0000 mL | Freq: Once | CUTANEOUS | Status: DC

## 2022-05-25 MED ORDER — CEFAZOLIN SODIUM-DEXTROSE 2-4 GM/100ML-% IV SOLN
2.0000 g | INTRAVENOUS | Status: AC
  Administered 2022-05-26: 2 g via INTRAVENOUS
  Filled 2022-05-25: qty 100

## 2022-05-25 NOTE — Progress Notes (Signed)
Mr. Timothy [name] denies chest pain or shortness of breath. Patient denies having any s/s of Covid in his household.  Patient denies any known exposure to Covid.   Mr. Timothy [name] PCP is Dr. Iona Beard Osei[place]Bonsu. Cardiologist is Dr Ellyn Hack.

## 2022-05-26 ENCOUNTER — Encounter (HOSPITAL_COMMUNITY)
Admission: RE | Disposition: A | Discharge status: Home / Self Care | Payer: Self-pay | Source: Ambulatory Visit | Attending: Vascular Surgery

## 2022-05-26 ENCOUNTER — Other Ambulatory Visit: Payer: Self-pay

## 2022-05-26 ENCOUNTER — Ambulatory Visit (HOSPITAL_COMMUNITY): Payer: Medicare Other | Admitting: Anesthesiology

## 2022-05-26 ENCOUNTER — Ambulatory Visit (HOSPITAL_COMMUNITY)
Admission: RE | Admit: 2022-05-26 | Discharge: 2022-05-26 | Disposition: A | Discharge status: Home / Self Care | Payer: Medicare Other | Source: Ambulatory Visit | Attending: Vascular Surgery | Admitting: Vascular Surgery

## 2022-05-26 ENCOUNTER — Ambulatory Visit (HOSPITAL_BASED_OUTPATIENT_CLINIC_OR_DEPARTMENT_OTHER): Payer: Medicare Other | Admitting: Anesthesiology

## 2022-05-26 ENCOUNTER — Encounter (HOSPITAL_COMMUNITY): Payer: Self-pay | Admitting: Vascular Surgery

## 2022-05-26 DIAGNOSIS — Z992 Dependence on renal dialysis: Secondary | ICD-10-CM | POA: Diagnosis not present

## 2022-05-26 DIAGNOSIS — I509 Heart failure, unspecified: Secondary | ICD-10-CM

## 2022-05-26 DIAGNOSIS — I132 Hypertensive heart and chronic kidney disease with heart failure and with stage 5 chronic kidney disease, or end stage renal disease: Secondary | ICD-10-CM | POA: Insufficient documentation

## 2022-05-26 DIAGNOSIS — Z87891 Personal history of nicotine dependence: Secondary | ICD-10-CM

## 2022-05-26 DIAGNOSIS — N186 End stage renal disease: Secondary | ICD-10-CM

## 2022-05-26 DIAGNOSIS — N185 Chronic kidney disease, stage 5: Secondary | ICD-10-CM

## 2022-05-26 DIAGNOSIS — T82838A Hemorrhage of vascular prosthetic devices, implants and grafts, initial encounter: Secondary | ICD-10-CM

## 2022-05-26 DIAGNOSIS — D631 Anemia in chronic kidney disease: Secondary | ICD-10-CM

## 2022-05-26 DIAGNOSIS — I5032 Chronic diastolic (congestive) heart failure: Secondary | ICD-10-CM | POA: Insufficient documentation

## 2022-05-26 HISTORY — DX: Thoracic aortic aneurysm, without rupture, unspecified: I71.20

## 2022-05-26 HISTORY — PX: REVISON OF ARTERIOVENOUS FISTULA: SHX6074

## 2022-05-26 LAB — POCT I-STAT, CHEM 8
BUN: 25 mg/dL — ABNORMAL HIGH (ref 8–23)
BUN: 36 mg/dL — ABNORMAL HIGH (ref 8–23)
BUN: 37 mg/dL — ABNORMAL HIGH (ref 8–23)
Calcium, Ion: 0.88 mmol/L — CL (ref 1.15–1.40)
Calcium, Ion: 0.94 mmol/L — ABNORMAL LOW (ref 1.15–1.40)
Calcium, Ion: 1.04 mmol/L — ABNORMAL LOW (ref 1.15–1.40)
Chloride: 90 mmol/L — ABNORMAL LOW (ref 98–111)
Chloride: 92 mmol/L — ABNORMAL LOW (ref 98–111)
Chloride: 92 mmol/L — ABNORMAL LOW (ref 98–111)
Creatinine, Ser: 5.8 mg/dL — ABNORMAL HIGH (ref 0.61–1.24)
Creatinine, Ser: 5.9 mg/dL — ABNORMAL HIGH (ref 0.61–1.24)
Creatinine, Ser: 5.9 mg/dL — ABNORMAL HIGH (ref 0.61–1.24)
Glucose, Bld: 78 mg/dL (ref 70–99)
Glucose, Bld: 78 mg/dL (ref 70–99)
Glucose, Bld: 78 mg/dL (ref 70–99)
HCT: 38 % — ABNORMAL LOW (ref 39.0–52.0)
HCT: 38 % — ABNORMAL LOW (ref 39.0–52.0)
HCT: 40 % (ref 39.0–52.0)
Hemoglobin: 12.9 g/dL — ABNORMAL LOW (ref 13.0–17.0)
Hemoglobin: 12.9 g/dL — ABNORMAL LOW (ref 13.0–17.0)
Hemoglobin: 13.6 g/dL (ref 13.0–17.0)
Potassium: 3.4 mmol/L — ABNORMAL LOW (ref 3.5–5.1)
Potassium: 6.3 mmol/L (ref 3.5–5.1)
Potassium: 6.3 mmol/L (ref 3.5–5.1)
Sodium: 131 mmol/L — ABNORMAL LOW (ref 135–145)
Sodium: 131 mmol/L — ABNORMAL LOW (ref 135–145)
Sodium: 135 mmol/L (ref 135–145)
TCO2: 32 mmol/L (ref 22–32)
TCO2: 32 mmol/L (ref 22–32)
TCO2: 33 mmol/L — ABNORMAL HIGH (ref 22–32)

## 2022-05-26 SURGERY — REVISON OF ARTERIOVENOUS FISTULA
Anesthesia: Monitor Anesthesia Care | Site: Arm Upper | Laterality: Left

## 2022-05-26 MED ORDER — OXYCODONE HCL 5 MG/5ML PO SOLN: 5.0000 mg | Freq: Once | ORAL | Status: AC | PRN

## 2022-05-26 MED ORDER — PAPAVERINE HCL 30 MG/ML IJ SOLN
INTRAMUSCULAR | Status: AC
  Filled 2022-05-26: qty 2

## 2022-05-26 MED ORDER — FENTANYL CITRATE (PF) 100 MCG/2ML IJ SOLN: 25.0000 ug | INTRAMUSCULAR | Status: DC | PRN

## 2022-05-26 MED ORDER — HEMOSTATIC AGENTS (NO CHARGE) OPTIME
TOPICAL | Status: DC | PRN
  Administered 2022-05-26: 1

## 2022-05-26 MED ORDER — PROPOFOL 10 MG/ML IV BOLUS
INTRAVENOUS | Status: DC | PRN
  Administered 2022-05-26: 100 mg via INTRAVENOUS
  Administered 2022-05-26: 10 mg via INTRAVENOUS

## 2022-05-26 MED ORDER — CHLORHEXIDINE GLUCONATE 0.12 % MT SOLN
OROMUCOSAL | Status: AC
  Filled 2022-05-26: qty 15

## 2022-05-26 MED ORDER — LIDOCAINE-EPINEPHRINE (PF) 1 %-1:200000 IJ SOLN
INTRAMUSCULAR | Status: DC | PRN
  Administered 2022-05-26: 5 mL

## 2022-05-26 MED ORDER — HEPARIN 6000 UNIT IRRIGATION SOLUTION
Status: AC
Start: 2022-05-26 — End: ?
  Filled 2022-05-26: qty 500

## 2022-05-26 MED ORDER — ONDANSETRON HCL 4 MG/2ML IJ SOLN
INTRAMUSCULAR | Status: DC | PRN
  Administered 2022-05-26: 4 mg via INTRAVENOUS

## 2022-05-26 MED ORDER — PROMETHAZINE HCL 25 MG/ML IJ SOLN: 6.2500 mg | INTRAMUSCULAR | Status: DC | PRN

## 2022-05-26 MED ORDER — LIDOCAINE-EPINEPHRINE (PF) 1 %-1:200000 IJ SOLN
INTRAMUSCULAR | Status: AC
  Filled 2022-05-26: qty 30

## 2022-05-26 MED ORDER — PROPOFOL 500 MG/50ML IV EMUL
INTRAVENOUS | Status: DC | PRN
  Administered 2022-05-26: 75 ug/kg/min via INTRAVENOUS

## 2022-05-26 MED ORDER — SODIUM CHLORIDE 0.9 % IV SOLN: INTRAVENOUS | Status: DC

## 2022-05-26 MED ORDER — OXYCODONE HCL 5 MG PO TABS
5.0000 mg | ORAL_TABLET | Freq: Once | ORAL | Status: AC | PRN
  Administered 2022-05-26: 5 mg via ORAL

## 2022-05-26 MED ORDER — 0.9 % SODIUM CHLORIDE (POUR BTL) OPTIME
TOPICAL | Status: DC | PRN
  Administered 2022-05-26: 1000 mL

## 2022-05-26 MED ORDER — FENTANYL CITRATE (PF) 250 MCG/5ML IJ SOLN
INTRAMUSCULAR | Status: AC
  Filled 2022-05-26: qty 5

## 2022-05-26 MED ORDER — LIDOCAINE 2% (20 MG/ML) 5 ML SYRINGE
INTRAMUSCULAR | Status: DC | PRN
  Administered 2022-05-26: 60 mg via INTRAVENOUS

## 2022-05-26 MED ORDER — GLYCOPYRROLATE 0.2 MG/ML IJ SOLN
INTRAMUSCULAR | Status: DC | PRN
  Administered 2022-05-26 (×2): .2 mg via INTRAVENOUS

## 2022-05-26 MED ORDER — OXYCODONE HCL 5 MG PO TABS
ORAL_TABLET | ORAL | Status: AC
  Filled 2022-05-26: qty 1

## 2022-05-26 MED ORDER — CHLORHEXIDINE GLUCONATE 0.12 % MT SOLN
15.0000 mL | OROMUCOSAL | Status: AC
  Administered 2022-05-26: 15 mL via OROMUCOSAL
  Filled 2022-05-26: qty 15

## 2022-05-26 MED ORDER — FENTANYL CITRATE (PF) 250 MCG/5ML IJ SOLN
INTRAMUSCULAR | Status: DC | PRN
Start: 2022-05-26 — End: 2022-05-26
  Administered 2022-05-26 (×3): 50 ug via INTRAVENOUS

## 2022-05-26 MED ORDER — STERILE WATER FOR IRRIGATION IR SOLN
Status: DC | PRN
  Administered 2022-05-26: 1000 mL

## 2022-05-26 MED ORDER — HEPARIN 6000 UNIT IRRIGATION SOLUTION
Status: DC | PRN
  Administered 2022-05-26: 1

## 2022-05-26 MED ORDER — MIDAZOLAM HCL 2 MG/2ML IJ SOLN
INTRAMUSCULAR | Status: AC
  Filled 2022-05-26: qty 2

## 2022-05-26 SURGICAL SUPPLY — 32 items
ARMBAND PINK RESTRICT EXTREMIT (MISCELLANEOUS) ×2 IMPLANT
BAG COUNTER SPONGE SURGICOUNT (BAG) ×2 IMPLANT
BANDAGE ESMARK 6X9 LF (GAUZE/BANDAGES/DRESSINGS) IMPLANT
BNDG ESMARK 6X9 LF (GAUZE/BANDAGES/DRESSINGS) ×2
CANISTER SUCT 3000ML PPV (MISCELLANEOUS) ×2 IMPLANT
CLIP LIGATING EXTRA MED SLVR (CLIP) ×2 IMPLANT
CLIP LIGATING EXTRA SM BLUE (MISCELLANEOUS) ×2 IMPLANT
COVER PROBE W GEL 5X96 (DRAPES) ×2 IMPLANT
CUFF TOURN SGL QUICK 18X4 (TOURNIQUET CUFF) ×1 IMPLANT
DERMABOND ADVANCED (GAUZE/BANDAGES/DRESSINGS) ×1
DERMABOND ADVANCED .7 DNX12 (GAUZE/BANDAGES/DRESSINGS) ×1 IMPLANT
ELECT REM PT RETURN 9FT ADLT (ELECTROSURGICAL) ×2
ELECTRODE REM PT RTRN 9FT ADLT (ELECTROSURGICAL) ×1 IMPLANT
GLOVE BIO SURGEON STRL SZ7.5 (GLOVE) ×2 IMPLANT
GOWN STRL REUS W/ TWL LRG LVL3 (GOWN DISPOSABLE) ×2 IMPLANT
GOWN STRL REUS W/ TWL XL LVL3 (GOWN DISPOSABLE) ×1 IMPLANT
GOWN STRL REUS W/TWL LRG LVL3 (GOWN DISPOSABLE) ×2
GOWN STRL REUS W/TWL XL LVL3 (GOWN DISPOSABLE) ×1
KIT BASIN OR (CUSTOM PROCEDURE TRAY) ×2 IMPLANT
KIT TURNOVER KIT B (KITS) ×2 IMPLANT
NS IRRIG 1000ML POUR BTL (IV SOLUTION) ×2 IMPLANT
PACK CV ACCESS (CUSTOM PROCEDURE TRAY) ×2 IMPLANT
PAD ARMBOARD 7.5X6 YLW CONV (MISCELLANEOUS) ×4 IMPLANT
POWDER SURGICEL 3.0 GRAM (HEMOSTASIS) ×1 IMPLANT
SUT MNCRL AB 4-0 PS2 18 (SUTURE) ×2 IMPLANT
SUT PROLENE 5 0 C 1 24 (SUTURE) ×2 IMPLANT
SUT PROLENE 6 0 BV (SUTURE) ×2 IMPLANT
SUT VIC AB 3-0 SH 27 (SUTURE) ×1
SUT VIC AB 3-0 SH 27X BRD (SUTURE) ×1 IMPLANT
TOWEL GREEN STERILE (TOWEL DISPOSABLE) ×2 IMPLANT
UNDERPAD 30X36 HEAVY ABSORB (UNDERPADS AND DIAPERS) ×2 IMPLANT
WATER STERILE IRR 1000ML POUR (IV SOLUTION) ×2 IMPLANT

## 2022-05-26 NOTE — H&P (Signed)
HPI:   Timothy [name] is a 61 y.o. male history of end-stage renal disease currently on dialysis Tuesdays, Thursdays and Saturdays via left upper extremity AV fistula.  He has had some prolonged bleeding after dialysis.  He did undergo fistulogram back in February with no intervention.  States that he takes no blood thinners.  He denies any recent interventions to the fistula itself.  He has not had any fevers or chills or bleeding without dialysis       Past Medical History:  Diagnosis Date   Acute metabolic encephalopathy 7/62/8315   Anemia of chronic kidney failure     BPH (benign prostatic hyperplasia)     Colon cancer ([place]) 2014    spouse states he had surgury for colon CA   End stage renal disease on dialysis ([place]) 2017   Hepatitis C     Hypertension     Hypertensive crisis 04/07/2022   Hypertensive heart disease with chronic diastolic congestive heart failure ([place]) 07/17/2016   Polysubstance abuse ([place])      History of heroin and marijuana use         Family History  Problem Relation Age of Onset   Heart failure Mother          Died at age 44.   Heart attack Mother 45   Hypertension Mother     Diabetes Mellitus II Mother     Other Father          Unknown   Kidney failure Sister          (Oldest Sister)   Other Other          Multiple siblings have started her heart disease, he is not sure of the details.   CAD Nephew           Past Surgical History:  Procedure Laterality Date   A/V FISTULAGRAM Left 12/02/2021    Procedure: A/V Fistulagram;  Surgeon: Cherre Robins, MD;  Location: [facility];  Service: Cardiovascular;  Laterality: Left;   ABDOMINAL SURGERY       AV FISTULA PLACEMENT Left 09/19/2016    Procedure: Left arm Radiocephalic ARTERIOVENOUS (AV) FISTULA CREATION;  Surgeon: Conrad Selma, MD;  Location: [facility];  Service: Vascular;  Laterality: Left;   [place] Left 07/09/2017    Procedure: BRACHIOCEPHALIC FISTULA CREATION;   Surgeon: Conrad [place], MD;  Location: [facility];  Service: Vascular;  Laterality: Left;   COLON SURGERY   2014   ESOPHAGOGASTRODUODENOSCOPY (EGD) WITH PROPOFOL N/A 11/05/2021    Procedure: ESOPHAGOGASTRODUODENOSCOPY (EGD) WITH PROPOFOL;  Surgeon: Carol Ada, MD;  Location: [facility];  Service: Endoscopy;  Laterality: N/A;   ESOPHAGOGASTRODUODENOSCOPY (EGD) WITH PROPOFOL N/A 11/14/2021    Procedure: ESOPHAGOGASTRODUODENOSCOPY (EGD) WITH PROPOFOL;  Surgeon: [name], MD;  Location: [facility];  Service: Gastroenterology;  Laterality: N/A;   ESOPHAGOGASTRODUODENOSCOPY (EGD) WITH PROPOFOL N/A 11/16/2021    Procedure: ESOPHAGOGASTRODUODENOSCOPY (EGD) WITH PROPOFOL;  Surgeon: [name], MD;  Location: [facility];  Service: Gastroenterology;  Laterality: N/A;   HEMOSTASIS CLIP PLACEMENT   11/16/2021    Procedure: HEMOSTASIS CLIP PLACEMENT;  Surgeon: [name], MD;  Location: [facility] ENDOSCOPY;  Service: Gastroenterology;;   HEMOSTASIS CONTROL   11/05/2021    Procedure: HEMOSTASIS CONTROL;  Surgeon: Carol Ada, MD;  Location: [facility];  Service: Endoscopy;;   HEMOSTASIS CONTROL   11/16/2021    Procedure: HEMOSTASIS CONTROL;  Surgeon: [name], MD;  Location[facility]  ENDOSCOPY;  Service: Gastroenterology;;   HOT HEMOSTASIS N/A 11/16/2021    Procedure: HOT HEMOSTASIS (ARGON PLASMA COAGULATION/BICAP);  Surgeon: [name], MD;  Location: [place];  Service: Gastroenterology;  Laterality: N/A;   INSERTION OF DIALYSIS CATHETER N/A 09/19/2016    Procedure: INSERTION OF TUNNELED DIALYSIS CATHETER;  Surgeon: Conrad [place], MD;  Location: [place];  Service: Vascular;  Laterality: N/A;   IR ANGIOGRAM SELECTIVE EACH ADDITIONAL VESSEL   11/16/2021   IR ANGIOGRAM VISCERAL SELECTIVE   11/05/2021   IR ANGIOGRAM VISCERAL SELECTIVE   11/16/2021   IR ANGIOGRAM VISCERAL SELECTIVE   11/16/2021   IR EMBO ART  VEN HEMORR LYMPH EXTRAV  INC GUIDE ROADMAPPING   11/05/2021   IR EMBO ART  VEN HEMORR LYMPH EXTRAV  INC  GUIDE ROADMAPPING   11/16/2021   IR GENERIC HISTORICAL   09/14/2016    IR US GUIDE VASC ACCESS RIGHT 09/14/2016 [name], DO [facility]   IR GENERIC HISTORICAL   09/14/2016    IR FLUORO GUIDE CV LINE RIGHT 09/14/2016 [name], DO [facility]   IR PARACENTESIS   06/22/2021   IR US GUIDE [place] RIGHT   11/05/2021   IR US GUIDE [place] RIGHT   11/16/2021   LAPAROTOMY N/A 05/23/2021    Procedure: EXPLORATORY LAPAROTOMY LYSIS ADHESIONS;  Surgeon: [name], MD;  Location: [facility];  Service: General;  Laterality: N/A;   LAPAROTOMY N/A 11/20/2021    Procedure: EXPLORATORY LAPAROTOMY, REPAIR OF BLEEDING DUODENAL ULCER;  Surgeon: Dwan Bolt, MD;  Location: [place];  Service: General;  Laterality: N/A;   ORIF TIBIA PLATEAU Left 01/15/2018    Procedure: OPEN REDUCTION INTERNAL FIXATION (ORIF) TIBIAL PLATEAU;  Surgeon: Altamese [place], MD;  Location: [facility];  Service: Orthopedics;  Laterality: Left;   SCLEROTHERAPY   11/05/2021    Procedure: Clide Deutscher;  Surgeon: Carol Ada, MD;  Location: [facility];  Service: Endoscopy;;   SCLEROTHERAPY   11/16/2021    Procedure: Clide Deutscher;  Surgeon: [name], MD;  Location: [facility] ENDOSCOPY;  Service: Gastroenterology;;   TRANSTHORACIC ECHOCARDIOGRAM   07/2016     EF 60-65%, No RWMA. Mod Concentric LVH - Gr 2 DD. Severe LA dilation. PAP ~35 mmHg (mild Pulm HTN)  --> no changes noted 1 month later      Short Social History:  Social History         Tobacco Use   Smoking status: Some Days      Packs/day: 0.25      Types: Cigarettes      Passive exposure: Never   Smokeless tobacco: Never  Substance Use Topics   Alcohol use: No      Alcohol/week: 7.0 standard drinks of alcohol      Types: 7 Cans of beer per week      No Known Allergies         Current Outpatient Medications  Medication Sig Dispense Refill   albuterol (VENTOLIN HFA) 108 (90 Base) MCG/ACT inhaler Inhale 2 puffs into the lungs every 6 (six) hours as needed for  wheezing or shortness of breath.       carvedilol (COREG) 3.125 MG tablet Take 3.125 mg by mouth 2 (two) times daily with a meal.       diltiazem (TIAZAC) 360 MG 24 hr capsule Take 360 mg by mouth daily.       doxycycline (VIBRA-TABS) 100 MG tablet Take 100 mg by mouth 2 (two) times daily.  DULoxetine (CYMBALTA) 20 MG capsule Take 20 mg by mouth daily.       hydrALAZINE (APRESOLINE) 25 MG tablet Take 1 tablet (25 mg total) by mouth every 6 (six) hours. 90 tablet 3   LIDOCAINE-PRILOCAINE EX Apply 1 application  topically Every Tuesday,Thursday,and Saturday with dialysis.       multivitamin (RENA-VIT) TABS tablet Take 1 tablet by mouth at bedtime. 30 tablet 3   oxyCODONE (OXY IR/ROXICODONE) 5 MG immediate release tablet Take 5 mg by mouth every 8 (eight) hours as needed for pain or severe pain.       predniSONE (DELTASONE) 20 MG tablet Take 2 tablets (40 mg total) by mouth daily with breakfast. 4 tablet 0   sevelamer carbonate (RENVELA) 800 MG tablet Take 1,600 mg by mouth 3 (three) times daily with meals.        No current facility-administered medications for this visit.      Review of Systems  Constitutional:  Constitutional negative. HENT: HENT negative.  Eyes: Eyes negative.  Respiratory: Respiratory negative.  Cardiovascular: Cardiovascular negative.  GI: Gastrointestinal negative.  Musculoskeletal: Musculoskeletal negative.  Skin: Skin negative.  Hematologic:       Prolonged bleeding left arm AV fistula after dialysis Psychiatric: Psychiatric negative.          Objective:    Vitals:   05/26/22 0722  BP: (!) 164/81  Pulse: 69  Resp: 18  Temp: 98.7 F (37.1 C)  SpO2: 90%      Physical Exam HENT:     Head: Normocephalic.     Nose: Nose normal.     Mouth/Throat:     Mouth: Mucous membranes are moist.  Eyes:     Pupils: Pupils are equal, round, and reactive to light.  Cardiovascular:     Pulses:          Radial pulses are 2+ on the left side.  Pulmonary:      Effort: Pulmonary effort is normal.  Abdominal:     General: Abdomen is flat.     Palpations: Abdomen is soft.  Skin:    Capillary Refill: Capillary refill takes less than 2 seconds.     Comments: 2 areas of pseudoaneurysmal ulceration overlying the upper arm AV fistula in the left  Neurological:     Mental Status: He is alert.  Psychiatric:        Mood and Affect: Mood normal.        Data: Recent fistulogram reviewed.      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  He has undergone fistulogram which demonstrates patency of the fistula and there is a very strong thrill.  We will plan to revise one of the pseudoaneurysms in the OR today and the fistula can be used for dialysis tomorrow.         Waynetta Sandy MD [facility]

## 2022-05-26 NOTE — Transfer of Care (Signed)
Immediate Anesthesia Transfer of Care Note  Patient: Timothy [name]  Procedure(s) Performed: REVISION OF LEFT ARM ARTERIOVENOUS FISTULA WITH PLICATION OF PSEUDOANEURYSM (Left: Arm Upper)  Patient Location: PACU  Anesthesia Type:General  Level of Consciousness: awake, alert  and oriented  Airway & Oxygen Therapy: Patient Spontanous Breathing and Patient connected to face mask oxygen  Post-op Assessment: Report given to RN, Post -op Vital signs reviewed and stable and Patient moving all extremities X 4  Post vital signs: Reviewed and stable  Last Vitals:  Vitals Value Taken Time  BP 152/98 05/26/22 1045  Temp    Pulse 63 05/26/22 1047  Resp 16 05/26/22 1047  SpO2 95 % 05/26/22 1047  Vitals shown include unvalidated device data.  Last Pain:  Vitals:   05/26/22 0745  TempSrc:   PainSc: 0-No pain      Patients Stated Pain Goal: 0 (17/71/16 5790)  Complications: No notable events documented.

## 2022-05-26 NOTE — Discharge Instructions (Signed)
[facility]  Discharge Instructions  AV Fistula or Graft Surgery for Dialysis Access  Please refer to the following instructions for your post-procedure care. Your surgeon or physician assistant will discuss any changes with you.  Activity  You may drive the day following your surgery, if you are comfortable and no longer taking prescription pain medication. Resume full activity as the soreness in your incision resolves.  Bathing/Showering  You may shower after you go home. Keep your incision dry for 48 hours. Do not soak in a bathtub, hot tub, or swim until the incision heals completely. You may not shower if you have a hemodialysis catheter.  Incision Care  Clean your incision with mild soap and water after 48 hours. Pat the area dry with a clean towel. You do not need a bandage unless otherwise instructed. Do not apply any ointments or creams to your incision. You may have skin glue on your incision. Do not peel it off. It will come off on its own in about one week. Your arm may swell a bit after surgery. To reduce swelling use pillows to elevate your arm so it is above your heart. Your doctor will tell you if you need to lightly wrap your arm with an ACE bandage.  Diet  Resume your normal diet. There are not special food restrictions following this procedure. In order to heal from your surgery, it is CRITICAL to get adequate nutrition. Your body requires vitamins, minerals, and protein. Vegetables are the best source of vitamins and minerals. Vegetables also provide the perfect balance of protein. Processed food has little nutritional value, so try to avoid this.  Medications  Resume taking all of your medications. If your incision is causing pain, you may take over-the counter pain relievers such as acetaminophen (Tylenol). If you were prescribed a stronger pain medication, please be aware these medications can cause nausea and constipation. Prevent  nausea by taking the medication with a snack or meal. Avoid constipation by drinking plenty of fluids and eating foods with high amount of fiber, such as fruits, vegetables, and grains.  Do not take Tylenol if you are taking prescription pain medications.  Follow up Your surgeon may want to see you in the office following your access surgery. If so, this will be arranged at the time of your surgery.  Please call us immediately for any of the following conditions:  Increased pain, redness, drainage (pus) from your incision site Fever of 101 degrees or higher Severe or worsening pain at your incision site Hand pain or numbness.  Reduce your risk of vascular disease:  Stop smoking. If you would like help, call [facility] at 1-800-QUIT-NOW (650)835-7033) or [place] at [place] your cholesterol Maintain a desired weight Control your diabetes Keep your blood pressure down  Dialysis  It will take several weeks to several months for your new dialysis access to be ready for use. Your surgeon will determine when it is okay to use it. Your nephrologist will continue to direct your dialysis. You can continue to use your Permcath until your new access is ready for use.   05/26/2022 Timothy [name] [id number] 30-Jul-1961  Surgeon(s): Waynetta Sandy, MD  Procedure(s): REVISION OF LEFT ARM ARTERIOVENOUS FISTULA WITH PLICATION OF PSEUDOANEURYSM  X May stick Fistula immediately above incision area   May stick graft on designated area only:   X Do not stick In area of Incision for 6 weeks    If you  have any questions, please call the office at [phone].

## 2022-05-26 NOTE — Anesthesia Postprocedure Evaluation (Signed)
Anesthesia Post Note  Patient: Timothy [name]  Procedure(s) Performed: REVISION OF LEFT ARM ARTERIOVENOUS FISTULA WITH PLICATION OF PSEUDOANEURYSM (Left: Arm Upper)     Patient location during evaluation: PACU Anesthesia Type: General Level of consciousness: awake and alert Pain management: pain level controlled Vital Signs Assessment: post-procedure vital signs reviewed and stable Respiratory status: spontaneous breathing, nonlabored ventilation and respiratory function stable Cardiovascular status: blood pressure returned to baseline and stable Postop Assessment: no apparent nausea or vomiting Anesthetic complications: no   No notable events documented.  Last Vitals:  Vitals:   05/26/22 1045 05/26/22 1100  BP: (!) 152/98 (!) 163/93  Pulse: 64 64  Resp: 14 14  Temp: (!) 36.3 C (!) 36.3 C  SpO2: 97% 93%                  Audry Pili

## 2022-05-26 NOTE — Anesthesia Preprocedure Evaluation (Addendum)
Anesthesia Evaluation  Patient identified by MRN, date of birth, ID band Patient awake    Reviewed: Allergy & Precautions, NPO status , Patient's Chart, lab work & pertinent test results, reviewed documented beta blocker date and time   History of Anesthesia Complications Negative for: history of anesthetic complications  Airway Mallampati: III  TM Distance: >3 FB Neck ROM: Full    Dental  (+) Dental Advisory Given, Missing   Pulmonary Current SmokerPatient did not abstain from smoking.,    Pulmonary exam normal        Cardiovascular hypertension, Pt. on home beta blockers and Pt. on medications +CHF  Normal cardiovascular exam   '23 TTE - EF 50 to 55%. The left ventricular internal cavity size was mildly dilated. There is moderate left ventricular hypertrophy. Grade II diastolic dysfunction (pseudonormalization). Right ventricular systolic function is mildly reduced. The right ventricular size is mildly enlarged. There is moderately elevated pulmonary artery systolic pressure. Left atrial size was moderately dilated. Right atrial size was mildly dilated. Mild MR. Tricuspid valve regurgitation is moderate to severe. Aortic valve regurgitation is trivial. There is mild dilatation of the ascending aorta, measuring 38 mm.     Neuro/Psych PSYCHIATRIC DISORDERS Depression negative neurological ROS     GI/Hepatic negative GI ROS, (+)     substance abuse  marijuana use and IV drug use, Hepatitis -, C  Endo/Other  negative endocrine ROS  Renal/GU ESRF and DialysisRenal disease     Musculoskeletal negative musculoskeletal ROS (+) narcotic dependent  Abdominal   Peds  Hematology  (+) Blood dyscrasia, anemia ,   Anesthesia Other Findings   Reproductive/Obstetrics                            Anesthesia Physical Anesthesia Plan  ASA: 3  Anesthesia Plan: MAC   Post-op Pain Management: Tylenol PO  (pre-op)*   Induction:   PONV Risk Score and Plan: 0 and Propofol infusion and Treatment may vary due to age or medical condition  Airway Management Planned: Natural Airway and Simple Face Mask  Additional Equipment: None  Intra-op Plan:   Post-operative Plan:   Informed Consent: I have reviewed the patients History and Physical, chart, labs and discussed the procedure including the risks, benefits and alternatives for the proposed anesthesia with the patient or authorized representative who has indicated his/her understanding and acceptance.       Plan Discussed with: CRNA and Anesthesiologist  Anesthesia Plan Comments:        Anesthesia Quick Evaluation

## 2022-05-26 NOTE — Anesthesia Procedure Notes (Signed)
Procedure Name: LMA Insertion Date/Time: 05/26/2022 9:49 AM  Performed by: Mariea Clonts, CRNAPre-anesthesia Checklist: Patient identified, Emergency Drugs available, Suction available and Patient being monitored Patient Re-evaluated:Patient Re-evaluated prior to induction Oxygen Delivery Method: Circle System Utilized Preoxygenation: Pre-oxygenation with 100% oxygen Induction Type: IV induction Ventilation: Mask ventilation without difficulty LMA: LMA inserted LMA Size: 5.0 Number of attempts: 1 Airway Equipment and Method: Bite block Placement Confirmation: positive ETCO2 Tube secured with: Tape Dental Injury: Teeth and Oropharynx as per pre-operative assessment

## 2022-05-26 NOTE — Op Note (Signed)
    Patient name: Timothy [name] MRN: [id number] DOB: 1961/03/10 Sex: male  05/26/2022 Pre-operative Diagnosis: esrd,  Post-operative diagnosis:  Same Surgeon:  Eda Paschal. Donzetta Matters, MD Assistant: Paulo Fruit, PA Procedure Performed:  Revision of left arm AV fistula with pseudoaneurysm plication and branch ligation  Indications: 61 year old male on dialysis via left arm AV fistula with 2 very large pseudoaneurysms 1 with prolonged bleeding more towards the antecubital grade he is now indicated for revision.  There are also multiple sidebranches and we are planning to ligate these at the same time.  Findings: Pseudoaneurysm was plicated 1 large sidebranch was divided between ties and a completion of a very strong thrill in the fistula and a palpable radial pulse at the wrist.   Procedure:  The patient was identified in the holding area and taken to the operating room where he is placed supine operative table and LMA anesthesia was induced.  He was sterilely prepped and draped in left upper extremity usual fashion, antibiotics were administered and a timeout was called.  A tourniquet was placed high on the left upper arm the arm was exsanguinated with an Esmarch and the tourniquet was inflated to 250 mmHg.  An elliptical incision was made around the ulcerated area of the pseudoaneurysm we dissected down to the fistula itself and remove the fistula sharply with scissors.  We then filled the pseudoaneurysm cavity with saline and closed with a running 5-0 Prolene suture in a mattress fashion.  Upon completion there is very strong thrill in the fistula we will allow the tourniquet down.  There was a very large sidebranch coming off of the pseudoaneurysm this was ligated between ties and divided.  We then freed up some of the soft tissue around the fistula using cautery and obtaining stasis also with cautery.  We thoroughly irrigated the wound and closed in layers with Vicryl and Monocryl.  Dermabond was placed  to the skin layer.  He was awakened from anesthesia having tolerated procedure without any complication.  Counts were correct at completion.  Given the complexity of the case,  the assistant was necessary in order to expedient the procedure and safely perform the technical aspects of the operation.  The assistant provided traction and countertraction to assist with exposure of the of the fistula and follow the Prolene suture during plication.  These skills could not have been adequately performed by a scrub tech assistant.    EBL: 20cc  Cherika Jessie C. Donzetta Matters, MD [facility] [place] Office: [phone] Pager: [phone]

## 2022-05-29 ENCOUNTER — Encounter (HOSPITAL_COMMUNITY): Payer: Self-pay | Admitting: Vascular Surgery

## 2022-06-13 NOTE — Progress Notes (Unsigned)
  POST OPERATIVE OFFICE NOTE    CC:  F/u for surgery  HPI:  This is a 61 y.o. male who is s/p Revision of left arm AV fistula with pseudoaneurysm plication and branch ligation on 05/26/22 by Dr. Donzetta Matters.    Pt returns today for follow up.  Pt denise pain, loss of motor or weakness in the left UE.    No Known Allergies  Current Outpatient Medications  Medication Sig Dispense Refill   albuterol (VENTOLIN HFA) 108 (90 Base) MCG/ACT inhaler Inhale 2 puffs into the lungs every 6 (six) hours as needed for wheezing or shortness of breath.     carvedilol (COREG) 3.125 MG tablet Take 3.125 mg by mouth 2 (two) times daily with a meal.     diltiazem (TIAZAC) 360 MG 24 hr capsule Take 360 mg by mouth in the morning.     doxycycline (VIBRA-TABS) 100 MG tablet Take 100 mg by mouth 2 (two) times daily.     DULoxetine (CYMBALTA) 20 MG capsule Take 20 mg by mouth daily.     hydrALAZINE (APRESOLINE) 25 MG tablet Take 1 tablet (25 mg total) by mouth every 6 (six) hours. 90 tablet 3   LIDOCAINE-PRILOCAINE EX Apply 1 application  topically Every Tuesday,Thursday,and Saturday with dialysis.     multivitamin (RENA-VIT) TABS tablet Take 1 tablet by mouth at bedtime. 30 tablet 3   oxyCODONE (OXY IR/ROXICODONE) 5 MG immediate release tablet Take 5 mg by mouth every 8 (eight) hours as needed for pain or severe pain.     predniSONE (DELTASONE) 20 MG tablet Take 2 tablets (40 mg total) by mouth daily with breakfast. 4 tablet 0   sevelamer carbonate (RENVELA) 800 MG tablet Take 1,600 mg by mouth 3 (three) times daily with meals.     No current facility-administered medications for this visit.     ROS:  See HPI  Physical Exam:    Incision:  Incision healing well with palpable thrill and palpable radial pulse Left UE with grip 5/5, palpable with intact sensation     Assessment/Plan:  This is a 61 y.o. male who is s/p: Revision of left arm AV fistula with pseudoaneurysm plication and branch ligation on 05/26/22  by Dr. Donzetta Matters.   -Well healing incision the area may be used 4 weeks from surgery if needed.  I would try to avoid sticking the new suture line if possible.  F/U PRN.   Roxy Horseman PA-C [facility] [phone]   Clinic MD:  Donzetta Matters

## 2022-06-14 ENCOUNTER — Ambulatory Visit (INDEPENDENT_AMBULATORY_CARE_PROVIDER_SITE_OTHER): Payer: Medicare Other | Admitting: Physician Assistant

## 2022-06-14 VITALS — BP 177/90 | HR 65 | Temp 98.1°F | Resp 20 | Ht 73.0 in | Wt 163.2 lb

## 2022-06-14 DIAGNOSIS — N186 End stage renal disease: Secondary | ICD-10-CM

## 2022-07-03 ENCOUNTER — Emergency Department (HOSPITAL_COMMUNITY): Payer: Medicare Other

## 2022-07-03 ENCOUNTER — Inpatient Hospital Stay (HOSPITAL_COMMUNITY)
Admission: EM | Admit: 2022-07-03 | Discharge: 2022-07-12 | DRG: 377 | Disposition: A | Discharge status: Home / Self Care | Payer: Medicare Other | Attending: Internal Medicine | Admitting: Internal Medicine

## 2022-07-03 ENCOUNTER — Encounter (HOSPITAL_COMMUNITY): Payer: Self-pay

## 2022-07-03 ENCOUNTER — Other Ambulatory Visit: Payer: Self-pay

## 2022-07-03 DIAGNOSIS — D500 Iron deficiency anemia secondary to blood loss (chronic): Secondary | ICD-10-CM | POA: Diagnosis not present

## 2022-07-03 DIAGNOSIS — I071 Rheumatic tricuspid insufficiency: Secondary | ICD-10-CM | POA: Diagnosis present

## 2022-07-03 DIAGNOSIS — B1920 Unspecified viral hepatitis C without hepatic coma: Secondary | ICD-10-CM | POA: Diagnosis present

## 2022-07-03 DIAGNOSIS — K633 Ulcer of intestine: Secondary | ICD-10-CM | POA: Diagnosis present

## 2022-07-03 DIAGNOSIS — Z634 Disappearance and death of family member: Secondary | ICD-10-CM

## 2022-07-03 DIAGNOSIS — Z9049 Acquired absence of other specified parts of digestive tract: Secondary | ICD-10-CM

## 2022-07-03 DIAGNOSIS — Z8711 Personal history of peptic ulcer disease: Secondary | ICD-10-CM

## 2022-07-03 DIAGNOSIS — R0989 Other specified symptoms and signs involving the circulatory and respiratory systems: Secondary | ICD-10-CM | POA: Diagnosis present

## 2022-07-03 DIAGNOSIS — K921 Melena: Secondary | ICD-10-CM

## 2022-07-03 DIAGNOSIS — K319 Disease of stomach and duodenum, unspecified: Secondary | ICD-10-CM | POA: Diagnosis present

## 2022-07-03 DIAGNOSIS — Z91119 Patient's noncompliance with dietary regimen due to unspecified reason: Secondary | ICD-10-CM

## 2022-07-03 DIAGNOSIS — D62 Acute posthemorrhagic anemia: Secondary | ICD-10-CM | POA: Diagnosis present

## 2022-07-03 DIAGNOSIS — K2981 Duodenitis with bleeding: Principal | ICD-10-CM | POA: Diagnosis present

## 2022-07-03 DIAGNOSIS — Z7952 Long term (current) use of systemic steroids: Secondary | ICD-10-CM

## 2022-07-03 DIAGNOSIS — D631 Anemia in chronic kidney disease: Secondary | ICD-10-CM | POA: Diagnosis present

## 2022-07-03 DIAGNOSIS — Z91148 Patient's other noncompliance with medication regimen for other reason: Secondary | ICD-10-CM

## 2022-07-03 DIAGNOSIS — K449 Diaphragmatic hernia without obstruction or gangrene: Secondary | ICD-10-CM | POA: Diagnosis present

## 2022-07-03 DIAGNOSIS — Z8249 Family history of ischemic heart disease and other diseases of the circulatory system: Secondary | ICD-10-CM

## 2022-07-03 DIAGNOSIS — K922 Gastrointestinal hemorrhage, unspecified: Secondary | ICD-10-CM

## 2022-07-03 DIAGNOSIS — I5042 Chronic combined systolic (congestive) and diastolic (congestive) heart failure: Secondary | ICD-10-CM | POA: Diagnosis present

## 2022-07-03 DIAGNOSIS — N400 Enlarged prostate without lower urinary tract symptoms: Secondary | ICD-10-CM | POA: Diagnosis present

## 2022-07-03 DIAGNOSIS — Z85038 Personal history of other malignant neoplasm of large intestine: Secondary | ICD-10-CM

## 2022-07-03 DIAGNOSIS — E8889 Other specified metabolic disorders: Secondary | ICD-10-CM | POA: Diagnosis present

## 2022-07-03 DIAGNOSIS — Z8679 Personal history of other diseases of the circulatory system: Secondary | ICD-10-CM

## 2022-07-03 DIAGNOSIS — G8929 Other chronic pain: Secondary | ICD-10-CM | POA: Diagnosis present

## 2022-07-03 DIAGNOSIS — Z992 Dependence on renal dialysis: Secondary | ICD-10-CM

## 2022-07-03 DIAGNOSIS — K5900 Constipation, unspecified: Secondary | ICD-10-CM | POA: Diagnosis present

## 2022-07-03 DIAGNOSIS — Z79899 Other long term (current) drug therapy: Secondary | ICD-10-CM

## 2022-07-03 DIAGNOSIS — E871 Hypo-osmolality and hyponatremia: Secondary | ICD-10-CM | POA: Diagnosis present

## 2022-07-03 DIAGNOSIS — I10 Essential (primary) hypertension: Secondary | ICD-10-CM | POA: Diagnosis present

## 2022-07-03 DIAGNOSIS — Z5320 Procedure and treatment not carried out because of patient's decision for unspecified reasons: Secondary | ICD-10-CM | POA: Diagnosis present

## 2022-07-03 DIAGNOSIS — E861 Hypovolemia: Secondary | ICD-10-CM | POA: Diagnosis present

## 2022-07-03 DIAGNOSIS — F17210 Nicotine dependence, cigarettes, uncomplicated: Secondary | ICD-10-CM | POA: Diagnosis present

## 2022-07-03 DIAGNOSIS — I712 Thoracic aortic aneurysm, without rupture, unspecified: Secondary | ICD-10-CM | POA: Diagnosis present

## 2022-07-03 DIAGNOSIS — D649 Anemia, unspecified: Secondary | ICD-10-CM

## 2022-07-03 DIAGNOSIS — Z8619 Personal history of other infectious and parasitic diseases: Secondary | ICD-10-CM

## 2022-07-03 DIAGNOSIS — I132 Hypertensive heart and chronic kidney disease with heart failure and with stage 5 chronic kidney disease, or end stage renal disease: Secondary | ICD-10-CM | POA: Diagnosis present

## 2022-07-03 DIAGNOSIS — F1110 Opioid abuse, uncomplicated: Secondary | ICD-10-CM | POA: Diagnosis present

## 2022-07-03 DIAGNOSIS — Z841 Family history of disorders of kidney and ureter: Secondary | ICD-10-CM

## 2022-07-03 DIAGNOSIS — F1910 Other psychoactive substance abuse, uncomplicated: Secondary | ICD-10-CM

## 2022-07-03 DIAGNOSIS — I440 Atrioventricular block, first degree: Secondary | ICD-10-CM | POA: Diagnosis present

## 2022-07-03 DIAGNOSIS — N186 End stage renal disease: Secondary | ICD-10-CM | POA: Diagnosis present

## 2022-07-03 DIAGNOSIS — I480 Paroxysmal atrial fibrillation: Secondary | ICD-10-CM | POA: Diagnosis present

## 2022-07-03 LAB — CBC WITH DIFFERENTIAL/PLATELET
Abs Immature Granulocytes: 0.01 10*3/uL (ref 0.00–0.07)
Basophils Absolute: 0 10*3/uL (ref 0.0–0.1)
Basophils Relative: 0 %
Eosinophils Absolute: 0.1 10*3/uL (ref 0.0–0.5)
Eosinophils Relative: 1 %
HCT: 21 % — ABNORMAL LOW (ref 39.0–52.0)
Hemoglobin: 6.6 g/dL — CL (ref 13.0–17.0)
Immature Granulocytes: 0 %
Lymphocytes Relative: 14 %
Lymphs Abs: 0.7 10*3/uL (ref 0.7–4.0)
MCH: 30.8 pg (ref 26.0–34.0)
MCHC: 31.4 g/dL (ref 30.0–36.0)
MCV: 98.1 fL (ref 80.0–100.0)
Monocytes Absolute: 1 10*3/uL (ref 0.1–1.0)
Monocytes Relative: 19 %
Neutro Abs: 3.5 10*3/uL (ref 1.7–7.7)
Neutrophils Relative %: 66 %
Platelets: 259 10*3/uL (ref 150–400)
RBC: 2.14 MIL/uL — ABNORMAL LOW (ref 4.22–5.81)
RDW: 19.5 % — ABNORMAL HIGH (ref 11.5–15.5)
WBC: 5.4 10*3/uL (ref 4.0–10.5)
nRBC: 0 % (ref 0.0–0.2)

## 2022-07-03 LAB — BASIC METABOLIC PANEL
Anion gap: 15 (ref 5–15)
BUN: 57 mg/dL — ABNORMAL HIGH (ref 8–23)
CO2: 26 mmol/L (ref 22–32)
Calcium: 8.1 mg/dL — ABNORMAL LOW (ref 8.9–10.3)
Chloride: 87 mmol/L — ABNORMAL LOW (ref 98–111)
Creatinine, Ser: 7.66 mg/dL — ABNORMAL HIGH (ref 0.61–1.24)
GFR, Estimated: 7 mL/min — ABNORMAL LOW (ref >60)
Glucose, Bld: 90 mg/dL (ref 70–99)
Potassium: 4.6 mmol/L (ref 3.5–5.1)
Sodium: 128 mmol/L — ABNORMAL LOW (ref 135–145)

## 2022-07-03 LAB — HEPATIC FUNCTION PANEL
ALT: 11 U/L (ref 0–44)
AST: 26 U/L (ref 15–41)
Albumin: 3.2 g/dL — ABNORMAL LOW (ref 3.5–5.0)
Alkaline Phosphatase: 99 U/L (ref 38–126)
Bilirubin, Direct: 0.3 mg/dL — ABNORMAL HIGH (ref 0.0–0.2)
Indirect Bilirubin: 0.5 mg/dL (ref 0.3–0.9)
Total Bilirubin: 0.8 mg/dL (ref 0.3–1.2)
Total Protein: 7.1 g/dL (ref 6.5–8.1)

## 2022-07-03 LAB — HIV ANTIBODY (ROUTINE TESTING W REFLEX): HIV Screen 4th Generation wRfx: NONREACTIVE

## 2022-07-03 LAB — HEPATITIS B CORE ANTIBODY, TOTAL: Hep B Core Total Ab: REACTIVE — AB

## 2022-07-03 LAB — HEMOGLOBIN AND HEMATOCRIT, BLOOD
HCT: 21.4 % — ABNORMAL LOW (ref 39.0–52.0)
Hemoglobin: 6.9 g/dL — CL (ref 13.0–17.0)

## 2022-07-03 LAB — PROTIME-INR
INR: 1.1 (ref 0.8–1.2)
Prothrombin Time: 14.2 seconds (ref 11.4–15.2)

## 2022-07-03 LAB — MRSA NEXT GEN BY PCR, NASAL: MRSA by PCR Next Gen: NOT DETECTED

## 2022-07-03 LAB — POC OCCULT BLOOD, ED: Fecal Occult Bld: POSITIVE — AB

## 2022-07-03 LAB — HEPATITIS B SURFACE ANTIGEN: Hepatitis B Surface Ag: NONREACTIVE

## 2022-07-03 LAB — ALBUMIN: Albumin: 3 g/dL — ABNORMAL LOW (ref 3.5–5.0)

## 2022-07-03 LAB — HEPATITIS C ANTIBODY: HCV Ab: REACTIVE — AB

## 2022-07-03 LAB — HEPATITIS B SURFACE ANTIBODY,QUALITATIVE: Hep B S Ab: REACTIVE — AB

## 2022-07-03 LAB — PHOSPHORUS: Phosphorus: 6.1 mg/dL — ABNORMAL HIGH (ref 2.5–4.6)

## 2022-07-03 LAB — PREPARE RBC (CROSSMATCH)

## 2022-07-03 LAB — TROPONIN I (HIGH SENSITIVITY): Troponin I (High Sensitivity): 78 ng/L — ABNORMAL HIGH (ref <18)

## 2022-07-03 MED ORDER — DILTIAZEM HCL ER COATED BEADS 180 MG PO CP24
360.0000 mg | ORAL_CAPSULE | Freq: Every day | ORAL | Status: DC
  Administered 2022-07-04 – 2022-07-12 (×8): 360 mg via ORAL
  Filled 2022-07-03 (×9): qty 2

## 2022-07-03 MED ORDER — BISACODYL 5 MG PO TBEC: 10.0000 mg | DELAYED_RELEASE_TABLET | Freq: Three times a day (TID) | ORAL | Status: AC

## 2022-07-03 MED ORDER — ACETAMINOPHEN 650 MG RE SUPP: 650.0000 mg | Freq: Four times a day (QID) | RECTAL | Status: DC | PRN

## 2022-07-03 MED ORDER — HYDRALAZINE HCL 25 MG PO TABS
25.0000 mg | ORAL_TABLET | Freq: Four times a day (QID) | ORAL | Status: DC
  Administered 2022-07-03 – 2022-07-12 (×21): 25 mg via ORAL
  Filled 2022-07-03 (×23): qty 1

## 2022-07-03 MED ORDER — OXYCODONE HCL 5 MG PO TABS
5.0000 mg | ORAL_TABLET | Freq: Three times a day (TID) | ORAL | Status: DC | PRN
  Administered 2022-07-03 – 2022-07-10 (×13): 5 mg via ORAL
  Filled 2022-07-03 (×14): qty 1

## 2022-07-03 MED ORDER — HYDRALAZINE HCL 20 MG/ML IJ SOLN: 10.0000 mg | Freq: Three times a day (TID) | INTRAMUSCULAR | Status: DC | PRN

## 2022-07-03 MED ORDER — DILTIAZEM HCL ER BEADS 240 MG PO CP24: 360.0000 mg | ORAL_CAPSULE | Freq: Every morning | ORAL | Status: DC

## 2022-07-03 MED ORDER — ALBUTEROL SULFATE (2.5 MG/3ML) 0.083% IN NEBU: 3.0000 mL | INHALATION_SOLUTION | Freq: Four times a day (QID) | RESPIRATORY_TRACT | Status: DC | PRN

## 2022-07-03 MED ORDER — CARVEDILOL 3.125 MG PO TABS
3.1250 mg | ORAL_TABLET | Freq: Two times a day (BID) | ORAL | Status: DC
  Administered 2022-07-03 – 2022-07-12 (×11): 3.125 mg via ORAL
  Filled 2022-07-03 (×12): qty 1

## 2022-07-03 MED ORDER — FLUTICASONE PROPIONATE 50 MCG/ACT NA SUSP
2.0000 | Freq: Every day | NASAL | Status: DC
  Administered 2022-07-08 – 2022-07-11 (×3): 2 via NASAL
  Filled 2022-07-03: qty 16

## 2022-07-03 MED ORDER — ACETAMINOPHEN 325 MG PO TABS
650.0000 mg | ORAL_TABLET | Freq: Four times a day (QID) | ORAL | Status: DC | PRN
  Administered 2022-07-07 – 2022-07-08 (×4): 650 mg via ORAL
  Filled 2022-07-03 (×4): qty 2

## 2022-07-03 MED ORDER — ONDANSETRON HCL 4 MG PO TABS: 4.0000 mg | ORAL_TABLET | Freq: Four times a day (QID) | ORAL | Status: DC | PRN

## 2022-07-03 MED ORDER — ORAL CARE MOUTH RINSE: 15.0000 mL | OROMUCOSAL | Status: DC | PRN

## 2022-07-03 MED ORDER — CHLORHEXIDINE GLUCONATE CLOTH 2 % EX PADS: 6.0000 | MEDICATED_PAD | Freq: Every day | CUTANEOUS | Status: DC

## 2022-07-03 MED ORDER — PANTOPRAZOLE 80MG IVPB - SIMPLE MED
80.0000 mg | Freq: Once | INTRAVENOUS | Status: AC
  Administered 2022-07-03: 80 mg via INTRAVENOUS
  Filled 2022-07-03: qty 100

## 2022-07-03 MED ORDER — SEVELAMER CARBONATE 800 MG PO TABS
1600.0000 mg | ORAL_TABLET | Freq: Three times a day (TID) | ORAL | Status: DC
  Administered 2022-07-04 – 2022-07-12 (×18): 1600 mg via ORAL
  Filled 2022-07-03 (×19): qty 2

## 2022-07-03 MED ORDER — PANTOPRAZOLE INFUSION (NEW) - SIMPLE MED
8.0000 mg/h | INTRAVENOUS | Status: DC
  Filled 2022-07-03: qty 100

## 2022-07-03 MED ORDER — ONDANSETRON HCL 4 MG/2ML IJ SOLN: 4.0000 mg | Freq: Four times a day (QID) | INTRAMUSCULAR | Status: DC | PRN

## 2022-07-03 MED ORDER — SODIUM CHLORIDE 0.9 % IV SOLN
10.0000 mL/h | Freq: Once | INTRAVENOUS | Status: AC
Start: 2022-07-03 — End: 2022-07-03
  Administered 2022-07-03: 10 mL/h via INTRAVENOUS

## 2022-07-03 MED ORDER — RENA-VITE PO TABS
1.0000 | ORAL_TABLET | Freq: Every day | ORAL | Status: DC
  Administered 2022-07-03 – 2022-07-11 (×8): 1 via ORAL
  Filled 2022-07-03 (×9): qty 1

## 2022-07-03 MED ORDER — PANTOPRAZOLE SODIUM 40 MG IV SOLR
40.0000 mg | Freq: Two times a day (BID) | INTRAVENOUS | Status: DC
  Administered 2022-07-03 – 2022-07-07 (×6): 40 mg via INTRAVENOUS
  Filled 2022-07-03 (×7): qty 10

## 2022-07-03 NOTE — Assessment & Plan Note (Signed)
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

## 2022-07-03 NOTE — Consult Note (Signed)
[facility] Consult: 3:25 PM 07/03/2022  LOS: 0 days    Referring Provider: Alecia Lemming ED PA-C  Primary Care Physician:  [name], MD Primary Gastroenterologist: unassigned     Reason for Consultation: Acute on chronic anemia.  Melenic stool.   HPI: Timothy [name] is a 61 y.o. male.PMH ESRD.  Hemodialysis TTS.  Eradicated hep C (undetectable HCV in 06/2021).  Thoracic aortic aneurysm.  CHF.  Anemia of chronic kidney disease.  Status post R hemicolectomy for colon cancer in 2014.  No subsequent colonoscopies.  Upper GI bleed with ABL requiring multiple PRBCs early this year.  11/05/2021 EGD by Dr. Benson Norway.  Large, bleeding duodenal ulcer treated with Hemospray followed by GDA embolization by IR. Did not follow through with prescribed Protonix. Recurrent bleeding within several days days.  CT angio negative for bleeding.  Differential included postembolization ischemia, coil migration, pseudoaneurysm, revascularization at embolization site though none of these identified on CTA.  General surgery recommended exhausting nonsurgical options as he is a poor operative risk. 11/14/2021 EGD showing small HH, antral erythema, nonbleeding duodenal ulcer with adherent clot.  Advised to continue PPI bid, Carafate 4 times daily. 11/16/2021 EGD.  Clotted blood seen in gastric body.  Nonobstructing duodenal ulcer with spurting hemorrhage was treated with epinephrine, 3 endoclips, bipolar cautery without success ultimately treated with Hemospray. 11/16/2021 embolization of anterior and pancreaticoduodenal arcades.  Serum H Pylori IgG was in normal range asof 11/07/21.    Other admissions this year for couple of in March, early May, late May and late June for hypoxic respiratory failure.  Cause for admission was acute hypoxic  respiratory failure and often suboptimal compliance with dialysis and fluid restrictions.  So hypertensive emergency with hypertensive encephalopathy.  On latest admission he had PAF but was not started on anticoagulation.  Now presents to the ED for evaluation of anemia.  Had lab work at dialysis last week and they contacted him today saying he should go to the ED because his blood counts were down.    Black stools about 2 or 3 times a day for a couple of weeks.  Preserved appetite.  No nausea or vomiting.  No abdominal pain.  No significant weight fluctuation.  Significant dyspnea with minor exertion is stable.  No dizziness.  No syncope.  Patient says he never was prescribed Protonix or Carafate and these are not on his current home med list.  However on all of his recent discharge summaries Protonix 40 bid is listed as a med at discharge summary.  The ED his Hgb is 6.6, 5 weeks ago it ranged around 13 to 13.5.   Has a low sodium at 128, was 131 six weeks ago.  Patient is married.  Disabled. Used to use heroin but that has been more than 15 years ago.  Smokes pot 2 or 3 times a month. Father deceased of unknown causes.  Mother died with heart failure age 35 had a heart attack at 90.  A sister has kidney failure.  Multiple siblings with heart disease.   Past Medical  History:  Diagnosis Date   Acute metabolic encephalopathy 81/85/6314   Anemia of chronic kidney failure    BPH (benign prostatic hyperplasia)    Colon cancer [facility]) 2014   spouse states he had surgury for colon CA   End stage renal disease on dialysis [facility]) 2017   TTHSat   Hepatitis C    treated   Hypertension    Hypertensive crisis 04/07/2022   Hypertensive heart disease with chronic diastolic congestive heart failure ([place]) 07/17/2016   Polysubstance abuse ([place])    History of heroin and marijuana use   Thoracic aortic aneurysm ([place])    followed by Dr. Ellyn Hack    Past Surgical History:  Procedure Laterality Date   A/V  FISTULAGRAM Left 12/02/2021   Procedure: A/V Fistulagram;  Surgeon: Cherre Robins, MD;  Location: [place] [facility];  Service: Cardiovascular;  Laterality: Left;   A/V FISTULAGRAM Left 04/24/2022   Procedure: A/V Fistulagram;  Surgeon: Waynetta Sandy, MD;  Location: [place] [facility];  Service: Cardiovascular;  Laterality: Left;   ABDOMINAL SURGERY     AV FISTULA PLACEMENT Left 09/19/2016   Procedure: Left arm Radiocephalic ARTERIOVENOUS (AV) FISTULA CREATION;  Surgeon: Conrad Deenwood, MD;  Location: [place];  Service: Vascular;  Laterality: Left;   [place] Left 07/09/2017   Procedure: BRACHIOCEPHALIC FISTULA CREATION;  Surgeon: Conrad Richland, MD;  Location: [place];  Service: Vascular;  Laterality: Left;   COLON SURGERY  2014   ESOPHAGOGASTRODUODENOSCOPY (EGD) WITH PROPOFOL N/A 11/05/2021   Procedure: ESOPHAGOGASTRODUODENOSCOPY (EGD) WITH PROPOFOL;  Surgeon: Carol Ada, MD;  Location: [place];  Service: Endoscopy;  Laterality: N/A;   ESOPHAGOGASTRODUODENOSCOPY (EGD) WITH PROPOFOL N/A 11/14/2021   Procedure: ESOPHAGOGASTRODUODENOSCOPY (EGD) WITH PROPOFOL;  Surgeon: [name], MD;  Location: [place];  Service: Gastroenterology;  Laterality: N/A;   ESOPHAGOGASTRODUODENOSCOPY (EGD) WITH PROPOFOL N/A 11/16/2021   Procedure: ESOPHAGOGASTRODUODENOSCOPY (EGD) WITH PROPOFOL;  Surgeon: [name], MD;  Location: [place];  Service: Gastroenterology;  Laterality: N/A;   HEMOSTASIS CLIP PLACEMENT  11/16/2021   Procedure: HEMOSTASIS CLIP PLACEMENT;  Surgeon: [name], MD;  Location: [facility];  Service: Gastroenterology;;   HEMOSTASIS CONTROL  11/05/2021   Procedure: HEMOSTASIS CONTROL;  Surgeon: Carol Ada, MD;  Location: [place];  Service: Endoscopy;;   HEMOSTASIS CONTROL  11/16/2021   Procedure: HEMOSTASIS CONTROL;  Surgeon: [name], MD;  Location: [place];  Service: Gastroenterology;;   HOT HEMOSTASIS N/A 11/16/2021   Procedure: HOT  HEMOSTASIS (ARGON PLASMA COAGULATION/BICAP);  Surgeon: [name], MD;  Location: [place];  Service: Gastroenterology;  Laterality: N/A;   INSERTION OF DIALYSIS CATHETER N/A 09/19/2016   Procedure: INSERTION OF TUNNELED DIALYSIS CATHETER;  Surgeon: Conrad Merino, MD;  Location: [facility];  Service: Vascular;  Laterality: N/A;   IR ANGIOGRAM SELECTIVE EACH ADDITIONAL VESSEL  11/16/2021   IR ANGIOGRAM VISCERAL SELECTIVE  11/05/2021   IR ANGIOGRAM VISCERAL SELECTIVE  11/16/2021   IR ANGIOGRAM VISCERAL SELECTIVE  11/16/2021   IR EMBO ART  VEN HEMORR LYMPH EXTRAV  INC GUIDE ROADMAPPING  11/05/2021   IR EMBO ART  VEN HEMORR LYMPH EXTRAV  INC GUIDE ROADMAPPING  11/16/2021   IR GENERIC HISTORICAL  09/14/2016   IR US GUIDE VASC ACCESS RIGHT 09/14/2016 [name], DO [facility]INTER[facility]   IR GENERIC HISTORICAL  09/14/2016   IR FLUORO GUIDE CV LINE RIGHT 09/14/2016 [name], DO [facility]   IR PARACENTESIS  06/22/2021   IR US  GUIDE VASC ACCESS RIGHT  11/05/2021   IR US GUIDE VASC ACCESS RIGHT  11/16/2021   LAPAROTOMY N/A 05/23/2021   Procedure: EXPLORATORY LAPAROTOMY LYSIS ADHESIONS;  Surgeon: [name], MD;  Location: [place];  Service: General;  Laterality: N/A;   LAPAROTOMY N/A 11/20/2021   Procedure: EXPLORATORY LAPAROTOMY, REPAIR OF BLEEDING DUODENAL ULCER;  Surgeon: Dwan Bolt, MD;  Location: [place];  Service: General;  Laterality: N/A;   ORIF TIBIA PLATEAU Left 01/15/2018   Procedure: OPEN REDUCTION INTERNAL FIXATION (ORIF) TIBIAL PLATEAU;  Surgeon: Altamese [place], MD;  Location: [place];  Service: Orthopedics;  Laterality: Left;   REVISON OF ARTERIOVENOUS FISTULA Left 05/26/2022   Procedure: REVISION OF LEFT ARM ARTERIOVENOUS FISTULA WITH PLICATION OF PSEUDOANEURYSM;  Surgeon: Waynetta Sandy, MD;  Location: [place];  Service: Vascular;  Laterality: Left;   SCLEROTHERAPY  11/05/2021   Procedure: Clide Deutscher;  Surgeon: Carol Ada, MD;  Location: [place];  Service: Endoscopy;;    SCLEROTHERAPY  11/16/2021   Procedure: Clide Deutscher;  Surgeon: [name], MD;  Location: [facility] END[facility]COPY;  Service: Gastroenterology;;   TRANSTHORACIC ECHOCARDIOGRAM  07/2016    EF 60-65%, No RWMA. Mod Concentric LVH - Gr 2 DD. Severe LA dilation. PAP ~35 mmHg (mild Pulm HTN)  --> no changes noted 1 month later    Prior to Admission medications   Medication Sig Start Date End Date Taking? Authorizing Provider  albuterol (VENTOLIN HFA) 108 (90 Base) MCG/ACT inhaler Inhale 2 puffs into the lungs every 6 (six) hours as needed for wheezing or shortness of breath. 01/06/22   [provider]  carvedilol (COREG) 3.125 MG tablet Take 3.125 mg by mouth 2 (two) times daily with a meal.    [provider]  diltiazem (TIAZAC) 360 MG 24 hr capsule Take 360 mg by mouth in the morning.    [provider]  Doxercalciferol (HECTOROL IV) Doxercalciferol (Hectorol) 06/06/22 06/05/23  [provider]  doxycycline (VIBRA-TABS) 100 MG tablet Take 100 mg by mouth 2 (two) times daily. 04/04/22   [provider]  DULoxetine (CYMBALTA) 20 MG capsule Take 20 mg by mouth daily. 01/06/22   [provider]  hydrALAZINE (APRESOLINE) 25 MG tablet Take 1 tablet (25 mg total) by mouth every 6 (six) hours. 03/15/22   Danford, Suann Larry, MD  lidocaine (LMX) 4 % cream SMARTSIG:1 sparingly Topical 3 Times a Week 06/13/22   [provider]  LIDOCAINE-PRILOCAINE EX Apply 1 application  topically Every Tuesday,Thursday,and Saturday with dialysis.    [provider]  multivitamin (RENA-VIT) TABS tablet Take 1 tablet by mouth at bedtime. 03/15/22   Danford, Suann Larry, MD  oxyCODONE (OXY IR/ROXICODONE) 5 MG immediate release tablet Take 5 mg by mouth every 8 (eight) hours as needed for pain or severe pain. 12/09/21   [provider]  predniSONE (DELTASONE) 20 MG tablet Take 2 tablets (40 mg total) by mouth daily with breakfast. 04/09/22   Samuella Cota, MD   sevelamer carbonate (RENVELA) 800 MG tablet Take 1,600 mg by mouth 3 (three) times daily with meals. 11/14/21   [provider]    Scheduled Meds:  Infusions:  sodium chloride     pantoprazole     pantoprazole     PRN Meds:    Allergies as of 07/03/2022   (No Known Allergies)    Family History  Problem Relation Age of Onset   Heart failure Mother        Died at age 5.  Heart attack Mother 50   Hypertension Mother    Diabetes Mellitus II Mother    Other Father        Unknown   Kidney failure Sister        (Oldest Sister)   Other Other        Multiple siblings have started her heart disease, he is not sure of the details.   CAD Nephew     Social History   Socioeconomic History   Marital status: Married    Spouse name: Not on file   Number of children: Not on file   Years of education: Not on file   Highest education level: Not on file  Occupational History   Occupation: disabled  Tobacco Use   Smoking status: Former    Packs/day: 0.25    Types: Cigarettes    Quit date: 05/12/2022    Years since quitting: 0.1    Passive exposure: Never   Smokeless tobacco: Never  Vaping Use   Vaping Use: Never used  Substance and Sexual Activity   Alcohol use: No    Alcohol/week: 7.0 standard drinks of alcohol    Types: 7 Cans of beer per week   Drug use: Yes    Frequency: 1.0 times per week    Types: Marijuana, Heroin    Comment: last time 05/25/22   Sexual activity: Yes    Comment: once a week  Other Topics Concern   Not on file  Social History Narrative   Not on file   Social Determinants of Health   Financial Resource Strain: Not on file  Food Insecurity: Not on file  Transportation Needs: Not on file  Physical Activity: Not on file  Stress: Not on file  Social Connections: Not on file  Intimate Partner Violence: Not on file    REVIEW OF SYSTEMS: Constitutional: No new weakness or fatigue but generally does not have a lot of energy and is  fairly inactive. ENT:  No nose bleeds Pulm: As per HPI.  Stable significant dyspnea on exertion. CV:  No palpitations, no LE edema.  Orthopnea.  Needs to sleep on several pillows. GU:  No hematuria, no frequency GI: Per HPI. Heme: Besides the black stools, denies any excessive or unusual bleeding or bruising. Transfusions: Several earlier this year. Neuro:  No headaches, no peripheral tingling or numbness Derm: Neuritic skin eruptions that occur mostly on his face and upper body. Endocrine:  No sweats or chills.  No polyuria or dysuria Immunization: Reviewed. Travel: Not queried.   PHYSICAL EXAM: Vital signs in last 24 hours: Vitals:   07/03/22 1413 07/03/22 1500  BP: (!) 171/91 (!) 161/97  Pulse: 74 65  Resp: 16 15  Temp: 97.8 F (36.6 C)   SpO2: 95% 97%   Wt Readings from Last 3 Encounters:  07/03/22 73.9 kg  06/14/22 74 kg  05/26/22 77.1 kg    General: Patient looks chronically ill.  No acute distress.  Lying comfortably on the bed. Head: No facial asymmetry.  No signs of head trauma. Eyes: Conjunctiva pale.  EOMI. Ears: No obvious hearing deficit. Nose: Congestion or discharge Mouth: Moist, clear, pink mucous membranes.  Tongue midline. Neck: No mass.  + JVD. Lungs: Diminished breath sounds but clear.  Wet, congested vocal quality and cough. Heart: RRR.  No MRG.  S1, S2 present Abdomen: Soft.  Not distended or tender.  No HSM, masses, bruits, hernias..   Rectal: Deferred Musc/Skeltl: No joint swelling or gross deformity. Extremities: No  CCE. Neurologic: Alert.  A bit drowsy but easily awakened.  Moves all 4 limbs without tremor, formal strength testing not performed. Skin: Multiple punctate raised lesions on his face and trunk.  Scars of these lesions also evident on the face and upper body. Nodes: No cervical adenopathy Psych: Cooperative, calm, pleasant.  Intake/Output from previous day: No intake/output data recorded. Intake/Output this shift: No  intake/output data recorded.  LAB RESULTS: Recent Labs    07/03/22 1257  WBC 5.4  HGB 6.6*  HCT 21.0*  PLT 259   BMET Lab Results  Component Value Date   NA 128 (L) 07/03/2022   NA 135 05/26/2022   NA 131 (L) 05/26/2022   K 4.6 07/03/2022   K 3.4 (L) 05/26/2022   K 6.3 (HH) 05/26/2022   CL 87 (L) 07/03/2022   CL 90 (L) 05/26/2022   CL 92 (L) 05/26/2022   CO2 26 07/03/2022   CO2 28 04/08/2022   CO2 27 04/07/2022   GLUCOSE 90 07/03/2022   GLUCOSE 78 05/26/2022   GLUCOSE 78 05/26/2022   BUN 57 (H) 07/03/2022   BUN 25 (H) 05/26/2022   BUN 37 (H) 05/26/2022   CREATININE 7.66 (H) 07/03/2022   CREATININE 5.80 (H) 05/26/2022   CREATININE 5.90 (H) 05/26/2022   CALCIUM 8.1 (L) 07/03/2022   CALCIUM 8.7 (L) 04/08/2022   CALCIUM 8.5 (L) 04/07/2022   LFT No results for input(s): "PROT", "ALBUMIN", "AST", "ALT", "ALKPHOS", "BILITOT", "BILIDIR", "IBILI" in the last 72 hours. PT/INR Lab Results  Component Value Date   INR 1.3 (H) 04/06/2022   INR 1.4 (H) 02/13/2022   INR 1.8 (H) 11/16/2021   Hepatitis Panel No results for input(s): "HEPBSAG", "HCVAB", "HEPAIGM", "HEPBIGM" in the last 72 hours. C-Diff No components found for: "CDIFF" Lipase     Component Value Date/Time   LIPASE 50 06/11/2021 1427    Drugs of Abuse     Component Value Date/Time   LABOPIA POSITIVE (A) 01/14/2018 1548   COCAINSCRNUR NONE DETECTED 01/14/2018 1548   LABBENZ POSITIVE (A) 01/14/2018 1548   AMPHETMU NONE DETECTED 01/14/2018 1548   THCU POSITIVE (A) 01/14/2018 1548   LABBARB NONE DETECTED 01/14/2018 1548     RADIOLOGY STUDIES: DG Chest 2 View  Result Date: 07/03/2022 CLINICAL DATA:  Shortness of breath EXAM: CHEST - 2 VIEW COMPARISON:  Previous studies including the examination of 04/07/2022 FINDINGS: Transverse diameter of heart is increased. Central pulmonary vessels are prominent. There are no signs of alveolar pulmonary edema. Linear densities in right lower lung fields suggest  scarring. There is no new focal pulmonary consolidation. There is no pleural effusion or pneumothorax. IMPRESSION: Cardiomegaly. Central pulmonary vessels are prominent without signs of pulmonary edema. Linear densities in right lower lung field have not changed, possibly scarring. No new focal pulmonary consolidation is seen. Electronically Signed   By: Elmer Picker M.D.   On: 07/03/2022 13:26      IMPRESSION:     Recurrent acute anemia. Prior history of blood loss anemia earlier this year requiring multiple transfusions. 1 PRBC ordered by ED PA-C.    Hx of upper GI bleed.  Multiple EGDs and hemostatic interventions in January through early February 2023.  11/05/21 Embolization of GDA.  11/16/21 embolization anterior and pancreaticoduodenal arcades.  Not taking PPI etc (says he never did, and that he never got Rx for this.....!!!)     HCV.  Eradicated.  Undetectable virus in September 2022.  ESRD.  Dialyzes TTS.  Multiple admissions this year  for acute hypoxic respiratory failure in setting of missed, incomplete dialysis, dietary indiscretion.   PLAN:     Set for upper endoscopy , colonoscopy on Wednesday.      Switch PPI to 80 mg bolus, then Protonix 40 mg IV bid.      Solid food tonight, start clears in AM    CBC 5 AM.  Hgb/hct at 2200.       Timothy [name]  07/03/2022, 3:25 PM Phone [phone]

## 2022-07-03 NOTE — H&P (Signed)
History and Physical    Patient: Timothy [name] [id number]:[id number] DOB: 05-17-1961 DOA: 07/03/2022 DOS: the patient was seen and examined on 07/03/2022 PCP: [name], MD  Patient coming from: Home - lives with his wife and kids. Uses a walker.    Chief Complaint: low hgb  HPI: Timothy [name] is a 61 y.o. male with medical history significant of ESRD on dialysis TTS, treated hepatitis C, HTN, diastolic CHF, hx of polysubstance abuse, thoracic aortic aneurysm, hx of GI bleed and duodenal ulcer with IR embolization in 11/16/21 to bleeding duodenal ulcer who presented to ED for low hemoglobin. He had labs drawn at his dialysis center and was told to come to ED. He has no idea he was anemic, but states he has been tired lately x1 week and short of breath for 2 weeks. He denies any blood in stool, but states he noticed his stool get dark about 2 weeks ago. He has no N/V/D, no abdominal pain.   Denies any fever/chills, vision changes/headaches, chest pain or palpitations, abdominal pain, N/V/D. He also states he has had some swelling in his right that has been here since February.   He does smoke. 2-3 cigarettes/day. He does not drink alcohol. He does not drink excessive caffeine or use NSAIDs.   EGD history 11/05/21: EGD with large, bleeding duodenal ulcer treated with hemospray followed by GDA embolization by IR. Never took his PPI had recurrent bleeding. General surgery recommended exhausting all non surgical options as he is a poor operative risk.  11/14/21: EGD small HH, antral erythema, nonbleeding duodenal ulcer with adherent clot.  11/16/21: EGD. Clotted blood seen in gastric body. Non obstructing duodenal ulcer with spurting blood tx'd with epi, 3 endoclips, bipolar cautery without success and tx'd with hemospray 11/16/21: embolization of anterior and pancreaticoduodenal arcades.   He has continued to not take protonix or carafate and states he's never had this medication.   ER Course:   vitals: afebrile, bp: 175/94, HR: 75, RR: 17, oxygen: 99%RA Pertinent labs: hgb: 6.6, sodium: 128, BUN: 57, creatinine: 7.66, fecal occult positive CXR: cardiomegaly. No acute finding In ED: started on protonix drip. Transfused 1 unit PRBC. Nephrology and GI consulted. TRH asked to admit.    Review of Systems: As mentioned in the history of present illness. All other systems reviewed and are negative. Past Medical History:  Diagnosis Date   Acute metabolic encephalopathy 44/81/8563   Anemia of chronic kidney failure    BPH (benign prostatic hyperplasia)    Colon cancer [facility]) 2014   spouse states he had surgury for colon CA   End stage renal disease on dialysis [facility]) 2017   TTHSat   Hepatitis C    treated   Hypertension    Hypertensive crisis 04/07/2022   Hypertensive heart disease with chronic diastolic congestive heart failure ([place]) 07/17/2016   Polysubstance abuse ([place])    History of heroin and marijuana use   Thoracic aortic aneurysm ([place])    followed by Dr. Ellyn Hack   Past Surgical History:  Procedure Laterality Date   A/V FISTULAGRAM Left 12/02/2021   Procedure: A/V Fistulagram;  Surgeon: Cherre Robins, MD;  Location: [place] CV [facility];  Service: Cardiovascular;  Laterality: Left;   A/V FISTULAGRAM Left 04/24/2022   Procedure: A/V Fistulagram;  Surgeon: Waynetta Sandy, MD;  Location: [place] CV LAB;  Service: Cardiovascular;  Laterality: Left;   ABDOMINAL SURGERY     AV FISTULA PLACEMENT Left 09/19/2016   Procedure: Left arm Radiocephalic ARTERIOVENOUS (  AV) FISTULA CREATION;  Surgeon: Conrad Seth Ward, MD;  Location: [place];  Service: Vascular;  Laterality: Left;   BASCILIC VEIN TRANSPOSITION Left 07/09/2017   Procedure: BRACHIOCEPHALIC FISTULA CREATION;  Surgeon: Conrad Jalapa, MD;  Location: [place];  Service: Vascular;  Laterality: Left;   COLON SURGERY  2014   ESOPHAGOGASTRODUODENOSCOPY (EGD) WITH PROPOFOL N/A 11/05/2021   Procedure: ESOPHAGOGASTRODUODENOSCOPY  (EGD) WITH PROPOFOL;  Surgeon: Carol Ada, MD;  Location: [place];  Service: Endoscopy;  Laterality: N/A;   ESOPHAGOGASTRODUODENOSCOPY (EGD) WITH PROPOFOL N/A 11/14/2021   Procedure: ESOPHAGOGASTRODUODENOSCOPY (EGD) WITH PROPOFOL;  Surgeon: [name], MD;  Location: [place];  Service: Gastroenterology;  Laterality: N/A;   ESOPHAGOGASTRODUODENOSCOPY (EGD) WITH PROPOFOL N/A 11/16/2021   Procedure: ESOPHAGOGASTRODUODENOSCOPY (EGD) WITH PROPOFOL;  Surgeon: [name], MD;  Location: [place];  Service: Gastroenterology;  Laterality: N/A;   HEMOSTASIS CLIP PLACEMENT  11/16/2021   Procedure: HEMOSTASIS CLIP PLACEMENT;  Surgeon: [name], MD;  Location: [facility] ENDOSCOPY;  Service: Gastroenterology;;   HEMOSTASIS CONTROL  11/05/2021   Procedure: HEMOSTASIS CONTROL;  Surgeon: Carol Ada, MD;  Location: [place];  Service: Endoscopy;;   HEMOSTASIS CONTROL  11/16/2021   Procedure: HEMOSTASIS CONTROL;  Surgeon: [name], MD;  Location: [place];  Service: Gastroenterology;;   HOT HEMOSTASIS N/A 11/16/2021   Procedure: HOT HEMOSTASIS (ARGON PLASMA COAGULATION/BICAP);  Surgeon: [name], MD;  Location: [place];  Service: Gastroenterology;  Laterality: N/A;   INSERTION OF DIALYSIS CATHETER N/A 09/19/2016   Procedure: INSERTION OF TUNNELED DIALYSIS CATHETER;  Surgeon: Conrad Moffett, MD;  Location: [place];  Service: Vascular;  Laterality: N/A;   IR ANGIOGRAM SELECTIVE EACH ADDITIONAL VESSEL  11/16/2021   IR ANGIOGRAM VISCERAL SELECTIVE  11/05/2021   IR ANGIOGRAM VISCERAL SELECTIVE  11/16/2021   IR ANGIOGRAM VISCERAL SELECTIVE  11/16/2021   IR EMBO ART  VEN HEMORR LYMPH EXTRAV  INC GUIDE ROADMAPPING  11/05/2021   IR EMBO ART  VEN HEMORR LYMPH EXTRAV  INC GUIDE ROADMAPPING  11/16/2021   IR GENERIC HISTORICAL  09/14/2016   IR US GUIDE VASC ACCESS RIGHT 09/14/2016 [name], DO [facility]-INTER[facility] RAD   IR GENERIC HISTORICAL  09/14/2016   IR FLUORO GUIDE CV LINE RIGHT 09/14/2016 [name], DO [facility]   IR PARACENTESIS  06/22/2021   IR US GUIDE [place] RIGHT  11/05/2021   IR US GUIDE [place] RIGHT  11/16/2021   LAPAROTOMY N/A 05/23/2021   Procedure: EXPLORATORY LAPAROTOMY LYSIS ADHESIONS;  Surgeon: [name], MD;  Location: [place];  Service: General;  Laterality: N/A;   LAPAROTOMY N/A 11/20/2021   Procedure: EXPLORATORY LAPAROTOMY, REPAIR OF BLEEDING DUODENAL ULCER;  Surgeon: Dwan Bolt, MD;  Location: [place];  Service: General;  Laterality: N/A;   ORIF TIBIA PLATEAU Left 01/15/2018   Procedure: OPEN REDUCTION INTERNAL FIXATION (ORIF) TIBIAL PLATEAU;  Surgeon: Altamese Anderson, MD;  Location: [place];  Service: Orthopedics;  Laterality: Left;   REVISON OF ARTERIOVENOUS FISTULA Left 05/26/2022   Procedure: REVISION OF LEFT ARM ARTERIOVENOUS FISTULA WITH PLICATION OF PSEUDOANEURYSM;  Surgeon: Waynetta Sandy, MD;  Location: [place];  Service: Vascular;  Laterality: Left;   SCLEROTHERAPY  11/05/2021   Procedure: Clide Deutscher;  Surgeon: Carol Ada, MD;  Location: [place];  Service: Endoscopy;;   SCLEROTHERAPY  11/16/2021   Procedure: Clide Deutscher;  Surgeon: [name], MD;  Location: [facility] ENDOSCOPY;  Service: Gastroenterology;;   TRANSTHORACIC ECHOCARDIOGRAM  07/2016    EF 60-65%, No RWMA.  Mod Concentric LVH - Gr 2 DD. Severe LA dilation. PAP ~35 mmHg (mild Pulm HTN)  --> no changes noted 1 month later   Social History:  reports that he quit smoking about 7 weeks ago. His smoking use included cigarettes. He smoked an average of .25 packs per day. He has never been exposed to tobacco smoke. He has never used smokeless tobacco. He reports current drug use. Frequency: 1.00 time per week. Drugs: Marijuana and Heroin. He reports that he does not drink alcohol.  No Known Allergies  Family History  Problem Relation Age of Onset   Heart failure Mother        Died at age 1.   Heart attack Mother 33   Hypertension Mother    Diabetes Mellitus II Mother     Other Father        Unknown   Kidney failure Sister        (Oldest Sister)   Other Other        Multiple siblings have started her heart disease, he is not sure of the details.   CAD Nephew     Prior to Admission medications   Medication Sig Start Date End Date Taking? Authorizing Provider  albuterol (VENTOLIN HFA) 108 (90 Base) MCG/ACT inhaler Inhale 2 puffs into the lungs every 6 (six) hours as needed for wheezing or shortness of breath. 01/06/22   [provider]  carvedilol (COREG) 3.125 MG tablet Take 3.125 mg by mouth 2 (two) times daily with a meal.    [provider]  diltiazem (TIAZAC) 360 MG 24 hr capsule Take 360 mg by mouth in the morning.    [provider]  Doxercalciferol (HECTOROL IV) Doxercalciferol (Hectorol) 06/06/22 06/05/23  [provider]  doxycycline (VIBRA-TABS) 100 MG tablet Take 100 mg by mouth 2 (two) times daily. 04/04/22   [provider]  DULoxetine (CYMBALTA) 20 MG capsule Take 20 mg by mouth daily. 01/06/22   [provider]  hydrALAZINE (APRESOLINE) 25 MG tablet Take 1 tablet (25 mg total) by mouth every 6 (six) hours. 03/15/22   Danford, Suann Larry, MD  lidocaine (LMX) 4 % cream SMARTSIG:1 sparingly Topical 3 Times a Week 06/13/22   [provider]  LIDOCAINE-PRILOCAINE EX Apply 1 application  topically Every Tuesday,Thursday,and Saturday with dialysis.    [provider]  multivitamin (RENA-VIT) TABS tablet Take 1 tablet by mouth at bedtime. 03/15/22   Danford, Suann Larry, MD  oxyCODONE (OXY IR/ROXICODONE) 5 MG immediate release tablet Take 5 mg by mouth every 8 (eight) hours as needed for pain or severe pain. 12/09/21   [provider]  predniSONE (DELTASONE) 20 MG tablet Take 2 tablets (40 mg total) by mouth daily with breakfast. 04/09/22   Samuella Cota, MD  sevelamer carbonate (RENVELA) 800 MG tablet Take 1,600 mg by mouth 3 (three) times daily with meals. 11/14/21    [provider]    Physical Exam: Vitals:   07/03/22 1413 07/03/22 1500 07/03/22 1620 07/03/22 1638  BP: (!) 171/91 (!) 161/97 (!) 151/88 (!) 159/83  Pulse: 74 65 65 72  Resp: '16 15 15 17  '$ Temp: 97.8 F (36.6 C)  97.9 F (36.6 C) 97.9 F (36.6 C)  TempSrc: Oral  Oral Oral  SpO2: 95% 97% 95% 94%  Weight:      Height:       General:  Appears calm and comfortable and is in NAD Eyes:  PERRL, EOMI, normal lids, iris ENT:  grossly normal hearing, lips & tongue, mmm; appropriate dentition Neck:  no LAD, masses or thyromegaly; no carotid bruits, +JVD Cardiovascular:  RRR, +systolic murmur. 1+ edema in RLE  Respiratory:   CTA bilaterally with no wheezes/rales/rhonchi.  Mild dyspnea with talking.  Abdomen:  soft, NT, ND, NABS Back:   normal alignment, no CVAT Skin:  no rash or induration seen on limited exam Musculoskeletal:  grossly normal tone BUE/BLE, good ROM, no bony abnormality Lower extremity:   Limited foot exam with no ulcerations.  2+ distal pulses. Psychiatric:  grossly normal mood and affect, speech fluent and appropriate, AOx3 Neurologic:  CN 2-12 grossly intact, moves all extremities in coordinated fashion, sensation intact   Radiological Exams on Admission: Independently reviewed - see discussion in A/P where applicable  DG Chest 2 View  Result Date: 07/03/2022 CLINICAL DATA:  Shortness of breath EXAM: CHEST - 2 VIEW COMPARISON:  Previous studies including the examination of 04/07/2022 FINDINGS: Transverse diameter of heart is increased. Central pulmonary vessels are prominent. There are no signs of alveolar pulmonary edema. Linear densities in right lower lung fields suggest scarring. There is no new focal pulmonary consolidation. There is no pleural effusion or pneumothorax. IMPRESSION: Cardiomegaly. Central pulmonary vessels are prominent without signs of pulmonary edema. Linear densities in right lower lung field have not changed, possibly scarring. No new  focal pulmonary consolidation is seen. Electronically Signed   By: Elmer Picker M.D.   On: 07/03/2022 13:26    EKG: Independently reviewed.  NSR with rate 73, 1st degree AV block; nonspecific ST changes with no evidence of acute ischemia ST depression that is new    Labs on Admission: I have personally reviewed the available labs and imaging studies at the time of the admission.  Pertinent labs:   hgb: 6.6, sodium: 128,  BUN: 57,  creatinine: 7.66,  fecal occult positive  Assessment and Plan: Principal Problem:   Acute GI bleeding Active Problems:   ESRD on HD TTS   Hyponatremia   Paroxysmal A-fib (HCC)   HTN (hypertension)   Chronic combined systolic and diastolic CHF (congestive heart failure) (HCC)   History of colon cancer   historyof treated hep C    Polysubstance abuse (HCC)    Assessment and Plan: * Acute GI bleeding 61 year old presenting to ED with acute on chronic symptomatic anemia with history of duodenal ulcers who has been non compliant with PPI use -obs to progressive -GI consulted  -okay for diet per GI, clear liquids at midnight  -protonix BID, bolus as needed -hgb 6.6, receiving 1 unit PRBC. Transfuse if <7 -cbc q 6 hours  -see H&P for EGD history, but last EGD on 11/16/21 with non obstructing duodenal ulcer spurting blood that was treated with epi, 3 endoclips, bipolar cautery without success and tx'd with hemospray. He then had embolization by IR of anterior and pancreaticuoduodenal arcades. He appears to not have taken PPI or carafate following this.  -hx of colon cancer s/p right hemicolectomy in 2014 with no surveillance colonoscopies since that time.  -plan for EGD and colonoscopy on Wednesday after HD session tomorrow   ESRD on HD TTS Regular dialysis session on Saturday No emergent findings for dialysis Nephrology consulted for routine dialysis  Receiving 1 unit PRBC today. Trending cbc and may need another unit with dialysis   s/p:  Revision of left arm AV fistula with pseudoaneurysm plication and branch ligation on 05/26/22 by Dr. Donzetta Matters. (may be used 4 weeks from surgery if  needed)   Hyponatremia Likely secondary to ESRD and hypovolemia in setting of acute on chronic anemia  HD/blood transfusion Trend   Paroxysmal A-fib ([place]) Noted in June hospitalization. Currently in NSR Not on AC. Hx of recurrent bleeding duodenal ulcers  In NSR, continue coreg and diltiazem (does have 1st degree heart block, repeat ekg)    HTN (hypertension) Continue home medication of coreg 3.'125mg'$  BID, diltiazem '360mg'$  daily and hydralazine '25mg'$  q6 hours Hydralazine prn for SBP >180  Chronic combined systolic and diastolic CHF (congestive heart failure) ([place]) Does not appear overloaded at this time Volume control per dialysis No longer makes any urine, strict input Continue coreg Echo 02/2022: EF of 50-55% with grade 2 DD. Mildly reduced RVF. Tricuspid valve regurg is moderate to severe.   History of colon cancer S/p right hemicolectomy in 2014. No subsequent survelliance colonoscopies Scheduled to have one on 11/16/21, but this was not done due to active bleed during EGD and source of bleeding   historyof treated hep C  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   Polysubstance abuse [facility]) Remote history of heroine use, denies current use Occasional cigarette, declines nicotine patch      Advance Care Planning:   Code Status: Full Code   Consults: nephrology: Dr. Jonnie Finner and GI: [place]   DVT Prophylaxis: SCDs  Family Communication: none   Severity of Illness: The appropriate patient status for this patient is OBSERVATION. Observation status is judged to be reasonable and necessary in order to provide the required intensity of service to ensure the patient's safety. The patient's presenting symptoms, physical exam findings, and initial radiographic and laboratory data in the context of their medical condition is  felt to place them at decreased risk for further clinical deterioration. Furthermore, it is anticipated that the patient will be medically stable for discharge from the hospital within 2 midnights of admission.   Author: Orma Flaming, MD 07/03/2022 5:35 PM  For on call review [email or web address].

## 2022-07-03 NOTE — Assessment & Plan Note (Addendum)
Noted in June hospitalization. Currently in NSR Not on AC. Hx of recurrent bleeding duodenal ulcers  In NSR, continue coreg and diltiazem (does have 1st degree heart block, repeat ekg)

## 2022-07-03 NOTE — ED Provider Notes (Signed)
[facility] EMERGENCY DEPARTMENT Provider Note   CSN: [id number] Arrival date & time: 07/03/22  1222     History  Chief Complaint  Patient presents with   Abnormal Lab    Timothy [name] is a 61 y.o. male.  Patient with history of end-stage renal disease on dialysis (Tuesday/Thursday/Saturday), history of GI bleeding status post embolization of gastroduodenal artery February 2023, chronic pain, hypertension, prior hemicolectomy 2/2 colon cancer, CHF -- presents to the emergency department today for evaluation of low hemoglobin.  Patient was notified by dialysis center that his blood counts were low when they were last checked 2 days ago.  Patient reports increased fatigue and shortness of breath with exertion.  He states that his stools have looked black, but denies seeing red blood in the stool.  No focal abdominal pain.  No chest pain.  No fever.  No active anticoagulation.       Home Medications Prior to Admission medications   Medication Sig Start Date End Date Taking? Authorizing Provider  albuterol (VENTOLIN HFA) 108 (90 Base) MCG/ACT inhaler Inhale 2 puffs into the lungs every 6 (six) hours as needed for wheezing or shortness of breath. 01/06/22   [provider]  carvedilol (COREG) 3.125 MG tablet Take 3.125 mg by mouth 2 (two) times daily with a meal.    [provider]  diltiazem (TIAZAC) 360 MG 24 hr capsule Take 360 mg by mouth in the morning.    [provider]  Doxercalciferol (HECTOROL IV) Doxercalciferol (Hectorol) 06/06/22 06/05/23  [provider]  doxycycline (VIBRA-TABS) 100 MG tablet Take 100 mg by mouth 2 (two) times daily. 04/04/22   [provider]  DULoxetine (CYMBALTA) 20 MG capsule Take 20 mg by mouth daily. 01/06/22   [provider]  hydrALAZINE (APRESOLINE) 25 MG tablet Take 1 tablet (25 mg total) by mouth every 6 (six) hours. 03/15/22   Danford, Suann Larry, MD  lidocaine (LMX) 4 %  cream SMARTSIG:1 sparingly Topical 3 Times a Week 06/13/22   [provider]  LIDOCAINE-PRILOCAINE EX Apply 1 application  topically Every Tuesday,Thursday,and Saturday with dialysis.    [provider]  multivitamin (RENA-VIT) TABS tablet Take 1 tablet by mouth at bedtime. 03/15/22   Danford, Suann Larry, MD  oxyCODONE (OXY IR/ROXICODONE) 5 MG immediate release tablet Take 5 mg by mouth every 8 (eight) hours as needed for pain or severe pain. 12/09/21   [provider]  predniSONE (DELTASONE) 20 MG tablet Take 2 tablets (40 mg total) by mouth daily with breakfast. 04/09/22   Samuella Cota, MD  sevelamer carbonate (RENVELA) 800 MG tablet Take 1,600 mg by mouth 3 (three) times daily with meals. 11/14/21   [provider]      Allergies    Patient has no known allergies.    Review of Systems   Review of Systems  Physical Exam Updated Vital Signs BP (!) 171/91 (BP Location: Right Arm)   Pulse 74   Temp 97.8 F (36.6 C) (Oral)   Resp 16   Ht '6\' 1"'$  (1.854 m)   Wt 73.9 kg   SpO2 95%   BMI 21.51 kg/m   Physical Exam Vitals and nursing note reviewed. Exam conducted with a chaperone present.  Constitutional:      General: He is not in acute distress.    Appearance: He is well-developed.  HENT:     Head: Normocephalic and atraumatic.     Mouth/Throat:     Mouth:  Mucous membranes are moist.  Eyes:     General:        Right eye: No discharge.        Left eye: No discharge.     Conjunctiva/sclera: Conjunctivae normal.  Cardiovascular:     Rate and Rhythm: Normal rate and regular rhythm.     Heart sounds: Normal heart sounds.  Pulmonary:     Effort: Pulmonary effort is normal.     Breath sounds: Normal breath sounds.  Abdominal:     Palpations: Abdomen is soft.     Tenderness: There is no abdominal tenderness. There is no guarding or rebound.  Genitourinary:    Rectum: Guaiac result positive. No tenderness.     Comments: Black stool  consistent with melena Musculoskeletal:     Cervical back: Normal range of motion and neck supple.  Skin:    General: Skin is warm and dry.  Neurological:     Mental Status: He is alert.     ED Results / Procedures / Treatments   Labs (all labs ordered are listed, but only abnormal results are displayed) Labs Reviewed  CBC WITH DIFFERENTIAL/PLATELET - Abnormal; Notable for the following components:      Result Value   RBC 2.14 (*)    Hemoglobin 6.6 (*)    HCT 21.0 (*)    RDW 19.5 (*)    All other components within normal limits  BASIC METABOLIC PANEL - Abnormal; Notable for the following components:   Sodium 128 (*)    Chloride 87 (*)    BUN 57 (*)    Creatinine, Ser 7.66 (*)    Calcium 8.1 (*)    GFR, Estimated 7 (*)    All other components within normal limits  POC OCCULT BLOOD, ED - Abnormal; Notable for the following components:   Fecal Occult Bld POSITIVE (*)    All other components within normal limits  PROTIME-INR  HEPATIC FUNCTION PANEL  RAPID URINE DRUG SCREEN, HOSP PERFORMED  TYPE AND SCREEN  PREPARE RBC (CROSSMATCH)  TROPONIN I (HIGH SENSITIVITY)    ED ECG REPORT   Date: 07/03/2022  Rate: 73  Rhythm: normal sinus rhythm  QRS Axis: normal  Intervals: PR prolonged  ST/T Wave abnormalities: nonspecific ST/T changes  Conduction Disutrbances:first-degree A-V block   Narrative Interpretation:   Old EKG Reviewed: unchanged  I have personally reviewed the EKG tracing and agree with the computerized printout as noted.   Radiology DG Chest 2 View  Result Date: 07/03/2022 CLINICAL DATA:  Shortness of breath EXAM: CHEST - 2 VIEW COMPARISON:  Previous studies including the examination of 04/07/2022 FINDINGS: Transverse diameter of heart is increased. Central pulmonary vessels are prominent. There are no signs of alveolar pulmonary edema. Linear densities in right lower lung fields suggest scarring. There is no new focal pulmonary consolidation. There is no  pleural effusion or pneumothorax. IMPRESSION: Cardiomegaly. Central pulmonary vessels are prominent without signs of pulmonary edema. Linear densities in right lower lung field have not changed, possibly scarring. No new focal pulmonary consolidation is seen. Electronically Signed   By: Elmer Picker M.D.   On: 07/03/2022 13:26    Procedures Procedures    Medications Ordered in ED Medications  0.9 %  sodium chloride infusion (has no administration in time range)  pantoprazole (PROTONIX) 80 mg /NS 100 mL IVPB (has no administration in time range)  pantoprozole (PROTONIX) 80 mg /NS 100 mL infusion (has no administration in time range)    ED Course/ Medical  Decision Making/ A&P    Patient seen and examined. History obtained directly from patient. Work-up including labs, imaging, EKG ordered in triage, if performed, were reviewed.    Labs/EKG: Independently reviewed and interpreted.  This included: CBC with hemoglobin 6.6, normal white blood cell count; BMP with hyponatremia 128, low chloride 87, creatinine 7.66 with BUN of 57 and normal anion gap.  Imaging: Independently visualized and interpreted.  This included: Chest x-ray, agree negative.  Medications/Fluids: Only blood transfusion  Most recent vital signs reviewed and are as follows: BP (!) 171/91 (BP Location: Right Arm)   Pulse 74   Temp 97.8 F (36.6 C) (Oral)   Resp 16   Ht '6\' 1"'$  (1.854 m)   Wt 73.9 kg   SpO2 95%   BMI 21.51 kg/m   Initial impression: Worsening anemia, although stable, in setting of ESRD and history of GI bleed.  3:19 PM Reassessment performed. Patient appears stable.  Rectal exam performed with RN chaperone.  Stool grossly melanotic.   Plan: 1 unit PRBC ordered, will start Protonix drip.  Will consult GI and nephrology.  Will need admission to hospital.  3:25 PM consulted with [facility], by telephone, they will consult on patient.  3:47 PM Consulted with Dr. Jonnie Finner with  nephrology who is aware of patient.   3:55 PM Consulted with Dr. Rogers Blocker, with [facility], who will see for admission.   CRITICAL CARE Performed by: Carlisle Cater Total critical care time: 40 minutes Critical care time was exclusive of separately billable procedures and treating other patients. Critical care was necessary to treat or prevent imminent or life-threatening deterioration. Critical care was time spent personally by me on the following activities: development of treatment plan with patient and/or surrogate as well as nursing, discussions with consultants, evaluation of patient's response to treatment, examination of patient, obtaining history from patient or surrogate, ordering and performing treatments and interventions, ordering and review of laboratory studies, ordering and review of radiographic studies, pulse oximetry and re-evaluation of patient's condition.                           Medical Decision Making Amount and/or Complexity of Data Reviewed Labs: ordered.   Patient with symptomatic anemia, melena suggestive of upper GI bleed.  Patient currently stable with elevated blood pressure, normal heart rate.  No significant abdominal pain.  Melena noted on exam.  Not particularly fluid overloaded at the current time and normal potassium.  No indication for emergent dialysis.         Final Clinical Impression(s) / ED Diagnoses Final diagnoses:  Acute upper GI bleed  Symptomatic anemia  ESRD on dialysis [facility])    Rx / DC Orders ED Discharge Orders     None         Carlisle Cater, PA-C 07/03/22 1558    Audley Hose, MD 07/04/22 [phone]

## 2022-07-03 NOTE — ED Triage Notes (Signed)
Dialysis patient who was sent for a low hgb.  Mild SOB no more than he always is due to dialysis.

## 2022-07-03 NOTE — Assessment & Plan Note (Addendum)
Does not appear volume overloaded at this time Volume management per HD.  No longer makes any urine, strict input Continue coreg Echo 02/2022: EF of 50-55% with grade 2 DD. Mildly reduced RVF. Tricuspid valve regurg is moderate to severe.  Outpatient follow-up with cardiology.

## 2022-07-03 NOTE — Assessment & Plan Note (Addendum)
Regular dialysis session on Saturday No emergent findings for dialysis Nephrology consulted for routine dialysis.  s/p: Revision of left arm AV fistula with pseudoaneurysm plication and branch ligation on 05/26/22 by Dr. Donzetta Matters. (may be used 4 weeks from surgery if needed). -Status posttransfusion 1 unit packed red blood cells on admission hemoglobin currently at 6.7 from 6.9 from 6.6 on admission. -Last hemoglobin noted at 13.6 (05/26/2022) -Patient to receive 2 units of packed red blood cells during hemodialysis today. -Nephrology following and appreciate input and recommendations.

## 2022-07-03 NOTE — Assessment & Plan Note (Signed)
61 year old presenting to ED with acute on chronic symptomatic anemia with history of duodenal ulcers who has been non compliant with PPI use -obs to progressive -GI consulted  -okay for diet per GI, clear liquids at midnight  -protonix BID, bolus as needed -hgb 6.6, receiving 1 unit PRBC. Transfuse if <7 -cbc q 6 hours  -see H&P for EGD history, but last EGD on 11/16/21 with non obstructing duodenal ulcer spurting blood that was treated with epi, 3 endoclips, bipolar cautery without success and tx'd with hemospray. He then had embolization by IR of anterior and pancreaticuoduodenal arcades. He appears to not have taken PPI or carafate following this.  -hx of colon cancer s/p right hemicolectomy in 2014 with no surveillance colonoscopies since that time.  -plan for EGD and colonoscopy on Wednesday after HD session tomorrow

## 2022-07-03 NOTE — Assessment & Plan Note (Signed)
Remote history of heroine use, denies current use Occasional cigarette, declines nicotine patch

## 2022-07-03 NOTE — Consult Note (Signed)
Renal Service Consult Note [facility]  Timothy [name] 07/03/2022 [name], MD Requesting Physician: Dr. Rogers Blocker  Reason for Consult: ESRD pt w/ low Hgb HPI: The patient is a 61 y.o. year-old w/ hx of anemia, BPH, colon Ca (treated), ESRD on HD, HTN, hx TAA, hx GIB, hx DU presented to ED for low Hb. He had labs drawn at his OP HD unit and was told to go to the ED. In the ED , Hb was 6.5 range. Pt denied any rectal bleeding or blood in stool, but has had dark stools the last 2 wks. No abd pain. In ED VVS, 99% on RA, Hb 6.6, Na 128  creat 7.6 , FOB +. Pt started on PPI gtt and is getting 1u prbc's transfused. We are asked to see for dialysis.   Seen in ED, no c/o's. Has not missed any HD. No sob, cough or CP.    ROS - denies CP, no joint pain, no HA, no blurry vision, no rash, no diarrhea, no nausea/ vomiting   Past Medical History  Past Medical History:  Diagnosis Date   Acute metabolic encephalopathy 44/12/4740   Anemia of chronic kidney failure    BPH (benign prostatic hyperplasia)    Colon cancer ([place]) 2014   spouse states he had surgury for colon CA   End stage renal disease on dialysis ([place]) 2017   TTHSat   Hepatitis C    treated   Hypertension    Hypertensive crisis 04/07/2022   Hypertensive heart disease with chronic diastolic congestive heart failure ([place]) 07/17/2016   Polysubstance abuse ([place])    History of heroin and marijuana use   Thoracic aortic aneurysm ([place])    followed by Dr. Ellyn Hack   Past Surgical History  Past Surgical History:  Procedure Laterality Date   A/V FISTULAGRAM Left 12/02/2021   Procedure: A/V Fistulagram;  Surgeon: Cherre Robins, MD;  Location: [place] [facility];  Service: Cardiovascular;  Laterality: Left;   A/V FISTULAGRAM Left 04/24/2022   Procedure: A/V Fistulagram;  Surgeon: Waynetta Sandy, MD;  Location: [place] [facility];  Service: Cardiovascular;  Laterality: Left;   ABDOMINAL SURGERY     AV FISTULA  PLACEMENT Left 09/19/2016   Procedure: Left arm Radiocephalic ARTERIOVENOUS (AV) FISTULA CREATION;  Surgeon: Conrad Wasco, MD;  Location: [place];  Service: Vascular;  Laterality: Left;   [place] Left 07/09/2017   Procedure: BRACHIOCEPHALIC FISTULA CREATION;  Surgeon: Conrad Hun[place]ville, MD;  Location: [place];  Service: Vascular;  Laterality: Left;   COLON SURGERY  2014   ESOPHAGOGASTRODUODENOSCOPY (EGD) WITH PROPOFOL N/A 11/05/2021   Procedure: ESOPHAGOGASTRODUODENOSCOPY (EGD) WITH PROPOFOL;  Surgeon: Carol Ada, MD;  Location: [place];  Service: Endoscopy;  Laterality: N/A;   ESOPHAGOGASTRODUODENOSCOPY (EGD) WITH PROPOFOL N/A 11/14/2021   Procedure: ESOPHAGOGASTRODUODENOSCOPY (EGD) WITH PROPOFOL;  Surgeon: [name], MD;  Location: [place];  Service: Gastroenterology;  Laterality: N/A;   ESOPHAGOGASTRODUODENOSCOPY (EGD) WITH PROPOFOL N/A 11/16/2021   Procedure: ESOPHAGOGASTRODUODENOSCOPY (EGD) WITH PROPOFOL;  Surgeon: [name], MD;  Location: [place];  Service: Gastroenterology;  Laterality: N/A;   HEMOSTASIS CLIP PLACEMENT  11/16/2021   Procedure: HEMOSTASIS CLIP PLACEMENT;  Surgeon: [name], MD;  Location: [facility] ENDOSCOPY;  Service: Gastroenterology;;   HEMOSTASIS CONTROL  11/05/2021   Procedure: HEMOSTASIS CONTROL;  Surgeon: Carol Ada, MD;  Location: [facility] ENDOSCOPY;  Service: Endoscopy;;   HEMOSTASIS CONTROL  11/16/2021   Procedure: HEMOSTASIS CONTROL;  Surgeon: Dayna Barker  C, MD;  Location: [place];  Service: Gastroenterology;;   HOT HEMOSTASIS N/A 11/16/2021   Procedure: HOT HEMOSTASIS (ARGON PLASMA COAGULATION/BICAP);  Surgeon: [name], MD;  Location: [place];  Service: Gastroenterology;  Laterality: N/A;   INSERTION OF DIALYSIS CATHETER N/A 09/19/2016   Procedure: INSERTION OF TUNNELED DIALYSIS CATHETER;  Surgeon: Conrad Filley, MD;  Location: [place];  Service: Vascular;  Laterality: N/A;   IR ANGIOGRAM SELECTIVE EACH ADDITIONAL VESSEL   11/16/2021   IR ANGIOGRAM VISCERAL SELECTIVE  11/05/2021   IR ANGIOGRAM VISCERAL SELECTIVE  11/16/2021   IR ANGIOGRAM VISCERAL SELECTIVE  11/16/2021   IR EMBO ART  VEN HEMORR LYMPH EXTRAV  INC GUIDE ROADMAPPING  11/05/2021   IR EMBO ART  VEN HEMORR LYMPH EXTRAV  INC GUIDE ROADMAPPING  11/16/2021   IR GENERIC HISTORICAL  09/14/2016   IR US GUIDE VASC ACCESS RIGHT 09/14/2016 [name], DO [facility]   IR GENERIC HISTORICAL  09/14/2016   IR FLUORO GUIDE CV LINE RIGHT 09/14/2016 [name], DO [facility]   IR PARACENTESIS  06/22/2021   IR US GUIDE [place] RIGHT  11/05/2021   IR US GUIDE [place] RIGHT  11/16/2021   LAPAROTOMY N/A 05/23/2021   Procedure: EXPLORATORY LAPAROTOMY LYSIS ADHESIONS;  Surgeon: [name], MD;  Location: [place];  Service: General;  Laterality: N/A;   LAPAROTOMY N/A 11/20/2021   Procedure: EXPLORATORY LAPAROTOMY, REPAIR OF BLEEDING DUODENAL ULCER;  Surgeon: Dwan Bolt, MD;  Location: [place];  Service: General;  Laterality: N/A;   ORIF TIBIA PLATEAU Left 01/15/2018   Procedure: OPEN REDUCTION INTERNAL FIXATION (ORIF) TIBIAL PLATEAU;  Surgeon: Altamese Poinsett, MD;  Location: [place];  Service: Orthopedics;  Laterality: Left;   REVISON OF ARTERIOVENOUS FISTULA Left 05/26/2022   Procedure: REVISION OF LEFT ARM ARTERIOVENOUS FISTULA WITH PLICATION OF PSEUDOANEURYSM;  Surgeon: Waynetta Sandy, MD;  Location: [place];  Service: Vascular;  Laterality: Left;   SCLEROTHERAPY  11/05/2021   Procedure: Clide Deutscher;  Surgeon: Carol Ada, MD;  Location: [place];  Service: Endoscopy;;   SCLEROTHERAPY  11/16/2021   Procedure: Clide Deutscher;  Surgeon: [name], MD;  Location: [facility] ENDOSCOPY;  Service: Gastroenterology;;   TRANSTHORACIC ECHOCARDIOGRAM  07/2016    EF 60-65%, No RWMA. Mod Concentric LVH - Gr 2 DD. Severe LA dilation. PAP ~35 mmHg (mild Pulm HTN)  --> no changes noted 1 month later   Family History  Family History  Problem Relation Age of Onset    Heart failure Mother        Died at age 82.   Heart attack Mother 42   Hypertension Mother    Diabetes Mellitus II Mother    Other Father        Unknown   Kidney failure Sister        (Oldest Sister)   Other Other        Multiple siblings have started her heart disease, he is not sure of the details.   CAD Nephew    Social History  reports that he quit smoking about 7 weeks ago. His smoking use included cigarettes. He smoked an average of .25 packs per day. He has never been exposed to tobacco smoke. He has never used smokeless tobacco. He reports current drug use. Frequency: 1.00 time per week. Drugs: Marijuana and Heroin. He reports that he does not drink alcohol. Allergies No Known Allergies Home medications Prior to Admission medications   Medication Sig Start Date End Date Taking?  Authorizing Provider  albuterol (VENTOLIN HFA) 108 (90 Base) MCG/ACT inhaler Inhale 2 puffs into the lungs every 6 (six) hours as needed for wheezing or shortness of breath. 01/06/22  Yes [provider]  carvedilol (COREG) 3.125 MG tablet Take 3.125 mg by mouth 2 (two) times daily with a meal.   Yes [provider]  diltiazem (TIAZAC) 360 MG 24 hr capsule Take 360 mg by mouth in the morning.   Yes [provider]  hydrALAZINE (APRESOLINE) 25 MG tablet Take 1 tablet (25 mg total) by mouth every 6 (six) hours. 03/15/22  Yes Danford, Suann Larry, MD  multivitamin (RENA-VIT) TABS tablet Take 1 tablet by mouth at bedtime. 03/15/22  Yes Danford, Suann Larry, MD  oxyCODONE (OXY IR/ROXICODONE) 5 MG immediate release tablet Take 5 mg by mouth every 8 (eight) hours as needed for pain or severe pain. 12/09/21  Yes [provider]  doxycycline (VIBRA-TABS) 100 MG tablet Take 100 mg by mouth 2 (two) times daily. Patient not taking: Reported on 07/03/2022 04/04/22   [provider]  DULoxetine (CYMBALTA) 20 MG capsule Take 20 mg by mouth daily. Patient not taking: Reported on  07/03/2022 01/06/22   [provider]  LIDOCAINE-PRILOCAINE EX Apply 1 application  topically Every Tuesday,Thursday,and Saturday with dialysis.    [provider]  predniSONE (DELTASONE) 20 MG tablet Take 2 tablets (40 mg total) by mouth daily with breakfast. 04/09/22   Samuella Cota, MD  sevelamer carbonate (RENVELA) 800 MG tablet Take 1,600 mg by mouth 3 (three) times daily with meals. 11/14/21   [provider]     Vitals:   07/03/22 1237 07/03/22 1413 07/03/22 1500 07/03/22 1620  BP:  (!) 171/91 (!) 161/97 (!) 151/88  Pulse:  74 65 65  Resp:  '16 15 15  '$ Temp:  97.8 F (36.6 C)  97.9 F (36.6 C)  TempSrc:  Oral  Oral  SpO2:  95% 97% 95%  Weight: 73.9 kg     Height: '6\' 1"'$  (1.854 m)      Exam Gen alert, no distress No rash, cyanosis or gangrene Sclera anicteric, throat clear  No jvd or bruits Chest clear bilat to bases, no rales/ wheezing RRR no MRG Abd soft ntnd no mass or ascites +bs GU normal male MS no joint effusions or deformity Ext mild bilat pretib edema, no wounds or ulcers Neuro is alert, Ox 3 , nf    LUA AVF+bruit   Home meds include - albuterol, carvedilol 3.125 bid, diltiazem qam, hydralazine 25 qid, renavite, oxycodone IR, duloxetine, prednisone, sevelamer carbonate 2 ac tid, prns/ vits/ supps     OP HD: [facility]  3h 46mn  67kg  2/2 bath  450/1.5   P4  Hep none    LUA AVF (using upper aspects only per pt, recovering from surgery) - hectorol 2 ug iV tiw - venofer '50mg'$  weekly (just completed IV fe load on 9/12) - mircera 225 q2, last 9/12, due 9/26   Assessment/ Plan: Recurrent anemia - Hb 6.6 here, possible GI bleed. Getting 1u prbcs today in ED.  Hx UGIB - had multiple EGD's and interventions in Jan-Feb 2023 including GDA embolization 11/05/21 and embolization of the anterior and pancreaticoduodenal arcades on 11/16/21. Was not taking PPI's at home.  Hx HCV - eradicated, undetectable virus in Sept 2022.   ESRD - on HD [facility]. Last  HD was Saturday 9/16. Plan for HD tomorrow.  HTN/ vol - cont BP meds here as needed.  On exam mild edema LE's Anemia esrd - Hb 6.6, getting prbcs MBD ckd - Ca in range, add on alb/ phos.       Kelly Splinter  MD 07/03/2022, 4:26 PM Recent Labs  Lab 07/03/22 1257  HGB 6.6*  CALCIUM 8.1*  CREATININE 7.66*  K 4.6   Inpatient medications:  pantoprazole (PROTONIX) IV  40 mg Intravenous Q12H    sodium chloride     pantoprazole

## 2022-07-03 NOTE — Assessment & Plan Note (Signed)
Likely secondary to ESRD and hypovolemia in setting of acute on chronic anemia  Renal panel pending. -Patient to be transfused 2 units packed red blood cells during HD. -On hemodialysis.

## 2022-07-03 NOTE — Assessment & Plan Note (Addendum)
Continue home medication of coreg 3.'125mg'$  BID, diltiazem '360mg'$  daily and hydralazine '25mg'$  q6 hours Hydralazine prn for SBP >180

## 2022-07-03 NOTE — Plan of Care (Signed)

## 2022-07-03 NOTE — ED Provider Triage Note (Signed)
Emergency Medicine Provider Triage Evaluation Note  Timothy [name] , a 61 y.o. male  was evaluated in triage.  Pt complains of low hemoglobin.  Dialysis patient.  Last went on Saturday.  Due tomorrow.  He was called today due to low hemoglobin and needing a transfusion.  No melena or BRBPR. Has some fatigue. Some SOB however has some Sob at baseline  Review of Systems  Positive: Low Hgb Negative:   Physical Exam  BP (!) 175/94 (BP Location: Right Arm)   Pulse 75   Temp 97.8 F (36.6 C) (Oral)   Resp 17   SpO2 99%  Gen:   Awake, no distress   Resp:  Normal effort  MSK:   Moves extremities without difficulty  Other:    Medical Decision Making  Medically screening exam initiated at 12:36 PM.  Appropriate orders placed.  Timothy [name] was informed that the remainder of the evaluation will be completed by another provider, this initial triage assessment does not replace that evaluation, and the importance of remaining in the ED until their evaluation is complete.  Low hgb   Timothy [name] A, PA-C 07/03/22 1236

## 2022-07-03 NOTE — Assessment & Plan Note (Signed)
S/p right hemicolectomy in 2014. No subsequent survelliance colonoscopies Scheduled to have one on 11/16/21, but this was not done due to active bleed during EGD and source of bleeding . -Patient to have colonoscopy per GI consultation. -Per GI.

## 2022-07-04 ENCOUNTER — Encounter (HOSPITAL_COMMUNITY)
Admission: EM | Disposition: A | Discharge status: Home / Self Care | Payer: Self-pay | Source: Home / Self Care | Attending: Family Medicine

## 2022-07-04 DIAGNOSIS — Z980 Intestinal bypass and anastomosis status: Secondary | ICD-10-CM | POA: Diagnosis not present

## 2022-07-04 DIAGNOSIS — T183XXA Foreign body in small intestine, initial encounter: Secondary | ICD-10-CM | POA: Diagnosis not present

## 2022-07-04 DIAGNOSIS — N186 End stage renal disease: Secondary | ICD-10-CM

## 2022-07-04 DIAGNOSIS — D500 Iron deficiency anemia secondary to blood loss (chronic): Secondary | ICD-10-CM | POA: Diagnosis not present

## 2022-07-04 DIAGNOSIS — I5042 Chronic combined systolic (congestive) and diastolic (congestive) heart failure: Secondary | ICD-10-CM | POA: Diagnosis present

## 2022-07-04 DIAGNOSIS — Z992 Dependence on renal dialysis: Secondary | ICD-10-CM

## 2022-07-04 DIAGNOSIS — Z8619 Personal history of other infectious and parasitic diseases: Secondary | ICD-10-CM | POA: Diagnosis not present

## 2022-07-04 DIAGNOSIS — K922 Gastrointestinal hemorrhage, unspecified: Secondary | ICD-10-CM | POA: Diagnosis not present

## 2022-07-04 DIAGNOSIS — I10 Essential (primary) hypertension: Secondary | ICD-10-CM | POA: Diagnosis not present

## 2022-07-04 DIAGNOSIS — Q438 Other specified congenital malformations of intestine: Secondary | ICD-10-CM | POA: Diagnosis not present

## 2022-07-04 DIAGNOSIS — Z8679 Personal history of other diseases of the circulatory system: Secondary | ICD-10-CM | POA: Diagnosis not present

## 2022-07-04 DIAGNOSIS — D649 Anemia, unspecified: Secondary | ICD-10-CM | POA: Diagnosis present

## 2022-07-04 DIAGNOSIS — E861 Hypovolemia: Secondary | ICD-10-CM | POA: Diagnosis present

## 2022-07-04 DIAGNOSIS — D631 Anemia in chronic kidney disease: Secondary | ICD-10-CM | POA: Diagnosis present

## 2022-07-04 DIAGNOSIS — R195 Other fecal abnormalities: Secondary | ICD-10-CM | POA: Diagnosis not present

## 2022-07-04 DIAGNOSIS — D62 Acute posthemorrhagic anemia: Secondary | ICD-10-CM | POA: Diagnosis present

## 2022-07-04 DIAGNOSIS — K6389 Other specified diseases of intestine: Secondary | ICD-10-CM | POA: Diagnosis not present

## 2022-07-04 DIAGNOSIS — K5900 Constipation, unspecified: Secondary | ICD-10-CM | POA: Diagnosis present

## 2022-07-04 DIAGNOSIS — E8889 Other specified metabolic disorders: Secondary | ICD-10-CM | POA: Diagnosis present

## 2022-07-04 DIAGNOSIS — K2981 Duodenitis with bleeding: Secondary | ICD-10-CM | POA: Diagnosis present

## 2022-07-04 DIAGNOSIS — E871 Hypo-osmolality and hyponatremia: Secondary | ICD-10-CM | POA: Diagnosis present

## 2022-07-04 DIAGNOSIS — I440 Atrioventricular block, first degree: Secondary | ICD-10-CM | POA: Diagnosis present

## 2022-07-04 DIAGNOSIS — I132 Hypertensive heart and chronic kidney disease with heart failure and with stage 5 chronic kidney disease, or end stage renal disease: Secondary | ICD-10-CM | POA: Diagnosis present

## 2022-07-04 DIAGNOSIS — Z79899 Other long term (current) drug therapy: Secondary | ICD-10-CM | POA: Diagnosis not present

## 2022-07-04 DIAGNOSIS — K3189 Other diseases of stomach and duodenum: Secondary | ICD-10-CM | POA: Diagnosis not present

## 2022-07-04 DIAGNOSIS — Z85038 Personal history of other malignant neoplasm of large intestine: Secondary | ICD-10-CM | POA: Diagnosis not present

## 2022-07-04 DIAGNOSIS — I480 Paroxysmal atrial fibrillation: Secondary | ICD-10-CM

## 2022-07-04 DIAGNOSIS — Z91119 Patient's noncompliance with dietary regimen due to unspecified reason: Secondary | ICD-10-CM | POA: Diagnosis not present

## 2022-07-04 DIAGNOSIS — K633 Ulcer of intestine: Secondary | ICD-10-CM | POA: Diagnosis present

## 2022-07-04 DIAGNOSIS — F1910 Other psychoactive substance abuse, uncomplicated: Secondary | ICD-10-CM

## 2022-07-04 DIAGNOSIS — K921 Melena: Secondary | ICD-10-CM | POA: Diagnosis not present

## 2022-07-04 DIAGNOSIS — G8929 Other chronic pain: Secondary | ICD-10-CM | POA: Diagnosis present

## 2022-07-04 DIAGNOSIS — K449 Diaphragmatic hernia without obstruction or gangrene: Secondary | ICD-10-CM | POA: Diagnosis not present

## 2022-07-04 DIAGNOSIS — F17210 Nicotine dependence, cigarettes, uncomplicated: Secondary | ICD-10-CM | POA: Diagnosis present

## 2022-07-04 DIAGNOSIS — F1110 Opioid abuse, uncomplicated: Secondary | ICD-10-CM | POA: Diagnosis present

## 2022-07-04 DIAGNOSIS — I071 Rheumatic tricuspid insufficiency: Secondary | ICD-10-CM | POA: Diagnosis present

## 2022-07-04 LAB — DIFFERENTIAL
Abs Immature Granulocytes: 0 10*3/uL (ref 0.00–0.07)
Basophils Absolute: 0.1 10*3/uL (ref 0.0–0.1)
Basophils Relative: 1 %
Eosinophils Absolute: 0.1 10*3/uL (ref 0.0–0.5)
Eosinophils Relative: 2 %
Lymphocytes Relative: 5 %
Lymphs Abs: 0.3 10*3/uL — ABNORMAL LOW (ref 0.7–4.0)
Monocytes Absolute: 0.7 10*3/uL (ref 0.1–1.0)
Monocytes Relative: 10 %
Neutro Abs: 5.7 10*3/uL (ref 1.7–7.7)
Neutrophils Relative %: 82 %

## 2022-07-04 LAB — PREPARE RBC (CROSSMATCH)

## 2022-07-04 LAB — CBC
HCT: 20.7 % — ABNORMAL LOW (ref 39.0–52.0)
HCT: 27.6 % — ABNORMAL LOW (ref 39.0–52.0)
Hemoglobin: 6.7 g/dL — CL (ref 13.0–17.0)
Hemoglobin: 9.1 g/dL — ABNORMAL LOW (ref 13.0–17.0)
MCH: 29.9 pg (ref 26.0–34.0)
MCH: 29.4 pg (ref 26.0–34.0)
MCHC: 32.4 g/dL (ref 30.0–36.0)
MCHC: 33 g/dL (ref 30.0–36.0)
MCV: 92.4 fL (ref 80.0–100.0)
MCV: 89.3 fL (ref 80.0–100.0)
Platelets: 243 10*3/uL (ref 150–400)
Platelets: 243 10*3/uL (ref 150–400)
RBC: 2.24 MIL/uL — ABNORMAL LOW (ref 4.22–5.81)
RBC: 3.09 MIL/uL — ABNORMAL LOW (ref 4.22–5.81)
RDW: 22.6 % — ABNORMAL HIGH (ref 11.5–15.5)
RDW: 21.7 % — ABNORMAL HIGH (ref 11.5–15.5)
WBC: 6.7 10*3/uL (ref 4.0–10.5)
WBC: 6.9 10*3/uL (ref 4.0–10.5)
nRBC: 0 % (ref 0.0–0.2)
nRBC: 0 % (ref 0.0–0.2)

## 2022-07-04 LAB — RENAL FUNCTION PANEL
Albumin: 3 g/dL — ABNORMAL LOW (ref 3.5–5.0)
Anion gap: 18 — ABNORMAL HIGH (ref 5–15)
BUN: 71 mg/dL — ABNORMAL HIGH (ref 8–23)
CO2: 24 mmol/L (ref 22–32)
Calcium: 8.4 mg/dL — ABNORMAL LOW (ref 8.9–10.3)
Chloride: 88 mmol/L — ABNORMAL LOW (ref 98–111)
Creatinine, Ser: 8.47 mg/dL — ABNORMAL HIGH (ref 0.61–1.24)
GFR, Estimated: 7 mL/min — ABNORMAL LOW (ref >60)
Glucose, Bld: 77 mg/dL (ref 70–99)
Phosphorus: 6.8 mg/dL — ABNORMAL HIGH (ref 2.5–4.6)
Potassium: 4.7 mmol/L (ref 3.5–5.1)
Sodium: 130 mmol/L — ABNORMAL LOW (ref 135–145)

## 2022-07-04 SURGERY — ESOPHAGOGASTRODUODENOSCOPY (EGD) WITH PROPOFOL
Anesthesia: Monitor Anesthesia Care

## 2022-07-04 MED ORDER — DOXERCALCIFEROL 4 MCG/2ML IV SOLN
2.0000 ug | INTRAVENOUS | Status: DC
  Administered 2022-07-06 – 2022-07-11 (×3): 2 ug via INTRAVENOUS
  Filled 2022-07-04 (×3): qty 2

## 2022-07-04 MED ORDER — ACETAMINOPHEN 325 MG PO TABS: 650.0000 mg | ORAL_TABLET | Freq: Once | ORAL | Status: DC

## 2022-07-04 MED ORDER — BISACODYL 5 MG PO TBEC
20.0000 mg | DELAYED_RELEASE_TABLET | Freq: Once | ORAL | Status: AC
  Administered 2022-07-04: 20 mg via ORAL
  Filled 2022-07-04: qty 4

## 2022-07-04 MED ORDER — PEG-KCL-NACL-NASULF-NA ASC-C 100 G PO SOLR
0.5000 | Freq: Once | ORAL | Status: AC
  Administered 2022-07-04 (×2): 100 g via ORAL

## 2022-07-04 MED ORDER — ENSURE ENLIVE PO LIQD: 237.0000 mL | Freq: Once | ORAL | Status: DC

## 2022-07-04 MED ORDER — DIPHENHYDRAMINE HCL 25 MG PO CAPS: 25.0000 mg | ORAL_CAPSULE | Freq: Once | ORAL | Status: DC

## 2022-07-04 MED ORDER — PEG-KCL-NACL-NASULF-NA ASC-C 100 G PO SOLR: 1.0000 | Freq: Once | ORAL | Status: DC

## 2022-07-04 MED ORDER — PEG-KCL-NACL-NASULF-NA ASC-C 100 G PO SOLR
0.5000 | Freq: Once | ORAL | Status: AC
  Administered 2022-07-04: 100 g via ORAL
  Filled 2022-07-04: qty 1

## 2022-07-04 MED ORDER — SODIUM CHLORIDE 0.9% IV SOLUTION: Freq: Once | INTRAVENOUS | Status: DC

## 2022-07-04 NOTE — Progress Notes (Signed)
Coordinated with hemodialysis nurse about the hgb 6.9 from 8 pm last night  which was not addressed and patient's refusal to have blood works done. NURSE claimed to have it done in the dialysis.

## 2022-07-04 NOTE — Progress Notes (Signed)
PROGRESS NOTE    Timothy [name]  MEQ:[id number] DOB: 11-20-1960 DOA: 07/03/2022 PCP: Timothy Mccreedy, MD    Chief Complaint  Patient presents with   Abnormal Lab    Brief Narrative:  HPI per Timothy [name] is a 61 y.o. male with medical history significant of ESRD on dialysis TTS, treated hepatitis C, HTN, diastolic CHF, hx of polysubstance abuse, thoracic aortic aneurysm, hx of GI bleed and duodenal ulcer with IR embolization in 11/16/21 to bleeding duodenal ulcer who presented to ED for low hemoglobin. He had labs drawn at his dialysis center and was told to come to ED. He has no idea he was anemic, but states he has been tired lately x1 week and short of breath for 2 weeks. He denies any blood in stool, but states he noticed his stool get dark about 2 weeks ago. He has no N/V/D, no abdominal pain.    Denies any fever/chills, vision changes/headaches, chest pain or palpitations, abdominal pain, N/V/D. He also states he has had some swelling in his right that has been here since February.    He does smoke. 2-3 cigarettes/day. He does not drink alcohol. He does not drink excessive caffeine or use NSAIDs.    EGD history 11/05/21: EGD with large, bleeding duodenal ulcer treated with hemospray followed by GDA embolization by IR. Never took his PPI had recurrent bleeding. General surgery recommended exhausting all non surgical options as he is a poor operative risk.  11/14/21: EGD small HH, antral erythema, nonbleeding duodenal ulcer with adherent clot.  11/16/21: EGD. Clotted blood seen in gastric body. Non obstructing duodenal ulcer with spurting blood tx'd with epi, 3 endoclips, bipolar cautery without success and tx'd with hemospray 11/16/21: embolization of anterior and pancreaticoduodenal arcades.   He has continued to not take protonix or carafate and states he's never had this medication.    ER Course:  vitals: afebrile, bp: 175/94, HR: 75, RR: 17, oxygen: 99%RA Pertinent  labs: hgb: 6.6, sodium: 128, BUN: 57, creatinine: 7.66, fecal occult positive CXR: cardiomegaly. No acute finding In ED: started on protonix drip. Transfused 1 unit PRBC. Nephrology and GI consulted. TRH asked to admit.       Assessment & Plan:  Principal Problem:   Acute GI bleeding Active Problems:   ESRD on HD TTS   Hyponatremia   Paroxysmal A-fib (HCC)   HTN (hypertension)   Chronic combined systolic and diastolic CHF (congestive heart failure) (HCC)   History of colon cancer   historyof treated hep C    Polysubstance abuse (HCC)    Assessment and Plan: * Acute GI bleeding 61 year old presented to ED with acute on chronic symptomatic anemia with history of duodenal ulcers who has been non compliant with PPI use. -Patient also endorses melanotic stools for the past 2 weeks. -FOBT positive. -Patient currently on clear liquids per GI recommendations.   -Hemoglobin on admission was 6.6 status post transfusion 1 unit packed red blood cells with hemoglobin currently at 6.7. -Continue IV PPI twice daily as recommended by GI. -Transfused 2 more units packed red blood cells in hemodialysis today. -Continue serial CBC. -see H&P for EGD history, but last EGD on 11/16/21 with non obstructing duodenal ulcer spurting blood that was treated with epi, 3 endoclips, bipolar cautery without success and tx'd with hemospray. He then had embolization by IR of anterior and pancreaticuoduodenal arcades. He appears to not have taken PPI or carafate following this.  -hx of colon cancer s/p right  hemicolectomy in 2014 with no surveillance colonoscopies since that time.  -plan for EGD and colonoscopy on Wednesday per GI recommendations after HD.  -Patient wanting a diet other than clear liquids, will defer diet to GI.   -Follow-up. -Transfusion threshold hemoglobin < 7.  -Per GI.  ESRD on HD TTS Regular dialysis session on Saturday No emergent findings for dialysis Nephrology consulted for routine  dialysis.  s/p: Revision of left arm AV fistula with pseudoaneurysm plication and branch ligation on 05/26/22 by Timothy [name]. (may be used 4 weeks from surgery if needed). -Status posttransfusion 1 unit packed red blood cells on admission hemoglobin currently at 6.7 from 6.9 from 6.6 on admission. -Last hemoglobin noted at 13.6 (05/26/2022) -Patient to receive 2 units of packed red blood cells during hemodialysis today. -Nephrology following and appreciate input and recommendations.  Hyponatremia Likely secondary to ESRD and hypovolemia in setting of acute on chronic anemia  Renal panel pending. -Patient to be transfused 2 units packed red blood cells during HD. -On hemodialysis.  Paroxysmal A-fib ([place]) Noted in June hospitalization. Currently in NSR Not on AC. Hx of recurrent bleeding duodenal ulcers  In NSR, continue coreg and diltiazem (does have 1st degree heart block). -Follow.   HTN (hypertension) Continue home medication of coreg 3.'125mg'$  BID, diltiazem '360mg'$  daily and hydralazine '25mg'$  q6 hours Hydralazine prn for SBP >180  Chronic combined systolic and diastolic CHF (congestive heart failure) ([name]) Does not appear volume overloaded at this time Volume management per HD.  No longer makes any urine, strict input Continue coreg Echo 02/2022: EF of 50-55% with grade 2 DD. Mildly reduced RVF. Tricuspid valve regurg is moderate to severe.  Outpatient follow-up with cardiology.  History of colon cancer S/p right hemicolectomy in 2014. No subsequent survelliance colonoscopies Scheduled to have one on 11/16/21, but this was not done due to active bleed during EGD and source of bleeding . -Patient to have colonoscopy per GI consultation. -Per GI.  historyof treated hep C  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   Polysubstance abuse [facility]) Remote history of heroine use, denies current use Occasional cigarette, declined nicotine patch on admission.          DVT  prophylaxis: SCDs Code Status: Full Family Communication: Updated patient.  No family present. Disposition: Likely home when clinically improved, GI bleed resolved, hemoglobin stabilized, cleared by GI.  Status is: Observation The patient remains OBS appropriate and will d/c before 2 midnights.   Consultants:  Gastroenterology: Dr. Tarri Glenn 07/03/2022 Nephrology: TimothySchertz 07/03/2022  Procedures:  Chest x-ray 07/03/2022 Transfusion of 2 units packed red blood cells pending 07/04/2022 Transfusion 1 unit packed red blood cells 07/03/2022  Antimicrobials:  None   Subjective: In hemodialysis.  Denies any chest pain.  Slight improvement with shortness of breath.  No abdominal pain.  Stated had a melanotic bowel movement this morning.  Currently on clear liquids which she states does not want and wants food.  Asking if he can have an Ensure.  Objective: Vitals:   07/04/22 0815 07/04/22 0830 07/04/22 0900 07/04/22 0920  BP: (!) 175/103 (!) 175/103 (!) 168/104 (!) 168/93  Pulse: 69 68 73 77  Resp: '17 13 19 20  '$ Temp:    (!) 97.4 F (36.3 C)  TempSrc:    Axillary  SpO2: 99% 100% 98%   Weight:      Height:        Intake/Output Summary (Last 24 hours) at 07/04/2022 0941 Last data filed at 07/04/2022 0500  Gross per 24 hour  Intake 10 ml  Output --  Net 10 ml   Filed Weights   07/03/22 1237 07/04/22 0814  Weight: 73.9 kg 77.9 kg    Examination:  General exam: Appears calm and comfortable  Respiratory system: Clear to auscultation.  No wheezes, no crackles, no rhonchi.  Respiratory effort normal. Cardiovascular system: S1 & S2 heard, RRR. No JVD, murmurs, rubs, gallops or clicks. No pedal edema. Gastrointestinal system: Abdomen is nondistended, soft and nontender. No organomegaly or masses felt. Normal bowel sounds heard. Central nervous system: Alert and oriented. No focal neurological deficits. Extremities: Symmetric 5 x 5 power. Skin: No rashes, lesions or ulcers Psychiatry:  Judgement and insight appear normal. Mood & affect appropriate.     Data Reviewed:   CBC: Recent Labs  Lab 07/03/22 1257 07/03/22 2039 07/04/22 0834  WBC 5.4  --  6.7  NEUTROABS 3.5  --   --   HGB 6.6* 6.9* 6.7*  HCT 21.0* 21.4* 20.7*  MCV 98.1  --  92.4  PLT 259  --  644    Basic Metabolic Panel: Recent Labs  Lab 07/03/22 1257 07/03/22 2039  NA 128*  --   K 4.6  --   CL 87*  --   CO2 26  --   GLUCOSE 90  --   BUN 57*  --   CREATININE 7.66*  --   CALCIUM 8.1*  --   PHOS  --  6.1*    GFR: Estimated Creatinine Clearance: 11.2 mL/min (A) (by C-G formula based on SCr of 7.66 mg/dL (H)).  Liver Function Tests: Recent Labs  Lab 07/03/22 1529 07/03/22 2039  AST 26  --   ALT 11  --   ALKPHOS 99  --   BILITOT 0.8  --   PROT 7.1  --   ALBUMIN 3.2* 3.0*    CBG: No results for input(s): "GLUCAP" in the last 168 hours.   Recent Results (from the past 240 hour(s))  MRSA Next Gen by PCR, Nasal     Status: None   Collection Time: 07/03/22  7:38 PM   Specimen: Nasal Mucosa; Nasal Swab  Result Value Ref Range Status   MRSA by PCR Next Gen NOT DETECTED NOT DETECTED Final    Comment: (NOTE) The GeneXpert MRSA Assay (FDA approved for NASAL specimens only), is one component of a comprehensive MRSA colonization surveillance program. It is not intended to diagnose MRSA infection nor to guide or monitor treatment for MRSA infections. Test performance is not FDA approved in patients less than 78 years old. Performed at [facility], [place] [address] [place], [place] [address]          Radiology Studies: DG Chest 2 View  Result Date: 07/03/2022 CLINICAL DATA:  Shortness of breath EXAM: CHEST - 2 VIEW COMPARISON:  Previous studies including the examination of 04/07/2022 FINDINGS: Transverse diameter of heart is increased. Central pulmonary vessels are prominent. There are no signs of alveolar pulmonary edema. Linear densities in right lower lung fields suggest  scarring. There is no new focal pulmonary consolidation. There is no pleural effusion or pneumothorax. IMPRESSION: Cardiomegaly. Central pulmonary vessels are prominent without signs of pulmonary edema. Linear densities in right lower lung field have not changed, possibly scarring. No new focal pulmonary consolidation is seen. Electronically Signed   By: Elmer Picker M.D.   On: 07/03/2022 13:26        Scheduled Meds:  sodium chloride   Intravenous Once  acetaminophen  650 mg Oral Once   bisacodyl  10 mg Oral Q8H   carvedilol  3.125 mg Oral BID WC   Chlorhexidine Gluconate Cloth  6 each Topical Q0600   diltiazem  360 mg Oral Daily   diphenhydrAMINE  25 mg Oral Once   fluticasone  2 spray Each Nare Daily   hydrALAZINE  25 mg Oral Q6H   multivitamin  1 tablet Oral QHS   pantoprazole (PROTONIX) IV  40 mg Intravenous Q12H   sevelamer carbonate  1,600 mg Oral TID WC   Continuous Infusions:   LOS: 0 days    Time spent: 40 minutes    Irine Seal, MD [facility]   To contact the attending provider between 7A-7P or the covering provider during after hours 7P-7A, please log into the web site [email or web address] and access using universal [place] password for that web site. If you do not have the password, please call the hospital operator.  07/04/2022, 9:41 AM

## 2022-07-04 NOTE — Progress Notes (Signed)
Complained about his clear liquid diet and wants to see the doctor.  Latest hgb from 8 pm last night - 6.9. MD made aware.

## 2022-07-04 NOTE — Progress Notes (Signed)
Back from the hemodialysis by  bed awake and alert. Placed on heart monitor but kept on taking it out. CMT made aware.

## 2022-07-04 NOTE — Progress Notes (Signed)
Completed 1 liter of moviprep  , started moving his bowel in the bathroom. Continue to monitor.

## 2022-07-04 NOTE — Progress Notes (Signed)
Transported to the hemodialysis by bed awake and alert.

## 2022-07-04 NOTE — Progress Notes (Signed)
[facility] Progress Note  CC:  GI bleed, "I want to eat"  Assessment: Admitted with recurrent, symptomatic anemia presenting with melena.  He has significant comorbidities including ESRD on dialysis with associated anemia of chronic kidney disease, HTN, HFrEF with an EF 50-55%, thoracic aortic aneurysm, multiple hospitalizations this year for acute hypoxic respiratory failure, history of colon cancer with resection in 2014, heroin abuse. It is unclear if he has had a surveillance colonoscopy since his resection.    He also has a history of bleeding gastric ulcer ultimately treated with embolization of GDA 11/05/21 and anterior and pancreaticoduodenal arcades 11/16/21. H pylori serology negative. No NSAIDs. Has not been using PPI therapy since that time.   Hemoglobin remains low despite 1 unit PRBC transfusion. Received 2 units in dialysis today.   EGD and colonoscopy recommended for evaluation.   Plan: - Clear liquid diet today - Bowel prep tonight in preparation for endoscopy - EGD and colonoscopy 07/05/22 - Continue serial hgb/hct with transfusion as indicated - Continue PPI therapy - Would he benefit from an anxiolytic while waiting for endoscopic evaluation?   Subjective: He has doubts about his ability to be on a liquid diet today. Trying to demand solid food. No overt bleeding. No family present.   Objective:  Vital signs in last 24 hours: Temp:  [97.4 F (36.3 C)-97.9 F (36.6 C)] 97.7 F (36.5 C) (09/19 1200) Pulse Rate:  [65-84] 84 (09/19 1200) Resp:  [13-22] 20 (09/19 1200) BP: (151-213)/(76-105) 176/85 (09/19 1200) SpO2:  [94 %-100 %] 99 % (09/19 1200) Weight:  [74.9 kg-77.9 kg] 74.9 kg (09/19 1200) Last BM Date : 07/03/22 General:   Alert, in NAD, appears chronically ill Heart:  Regular rate and rhythm; no murmurs Pulm: Clear anteriorly; no wheezing Abdomen:  Soft. Nontender. Nondistended. Normal bowel sounds. No rebound or guarding. LAD: No  inguinal or umbilical LAD Extremities:  Without edema. Neurologic:  Alert and  oriented x4;  grossly normal neurologically. Psych:  Alert and cooperative. Normal mood and affect.   Lab Results: Recent Labs    07/03/22 1257 07/03/22 2039 07/04/22 0834  WBC 5.4  --  6.7  HGB 6.6* 6.9* 6.7*  HCT 21.0* 21.4* 20.7*  PLT 259  --  243   BMET Recent Labs    07/03/22 1257 07/04/22 0834  NA 128* 130*  K 4.6 4.7  CL 87* 88*  CO2 26 24  GLUCOSE 90 77  BUN 57* 71*  CREATININE 7.66* 8.47*  CALCIUM 8.1* 8.4*   LFT Recent Labs    07/03/22 1529 07/03/22 2039 07/04/22 0834  PROT 7.1  --   --   ALBUMIN 3.2*   < > 3.0*  AST 26  --   --   ALT 11  --   --   ALKPHOS 99  --   --   BILITOT 0.8  --   --   BILIDIR 0.3*  --   --   IBILI 0.5  --   --    < > = values in this interval not displayed.   PT/INR Recent Labs    07/03/22 1529  LABPROT 14.2  INR 1.1   Hepatitis Panel Recent Labs    07/03/22 2039  HEPBSAG NON REACTIVE  HCVAB Reactive*    DG Chest 2 View  Result Date: 07/03/2022 CLINICAL DATA:  Shortness of breath EXAM: CHEST - 2 VIEW COMPARISON:  Previous studies including the examination of 04/07/2022 FINDINGS: Transverse diameter of heart is  increased. Central pulmonary vessels are prominent. There are no signs of alveolar pulmonary edema. Linear densities in right lower lung fields suggest scarring. There is no new focal pulmonary consolidation. There is no pleural effusion or pneumothorax. IMPRESSION: Cardiomegaly. Central pulmonary vessels are prominent without signs of pulmonary edema. Linear densities in right lower lung field have not changed, possibly scarring. No new focal pulmonary consolidation is seen. Electronically Signed   By: Elmer Picker M.D.   On: 07/03/2022 13:26      LOS: 0 days   [place]  07/04/2022, 1:17 PM

## 2022-07-04 NOTE — Progress Notes (Signed)
1L moviprep given to pt. Instructed to finish in an hour.

## 2022-07-04 NOTE — Progress Notes (Signed)
[facility] Progress Note  Subjective: seen on HD, no c/o  Vitals:   07/04/22 1100 07/04/22 1104 07/04/22 1130 07/04/22 1200  BP: (!) 160/100 (!) 188/105 (!) 186/90 (!) 176/85  Pulse: 79  80 84  Resp: 16  (!) 22 20  Temp:  97.7 F (36.5 C)  97.7 F (36.5 C)  TempSrc:  Axillary    SpO2: 98%  96% 99%  Weight:    74.9 kg  Height:        Exam: Gen alert, no distress No jvd or bruits Chest clear bilat to bases RRR no MRG Abd soft ntnd no mass or ascites +bs Ext mild bilat pretib edema Neuro is alert, Ox 3 , nf    LUA AVF+bruit    Home meds include - albuterol, carvedilol 3.125 bid, diltiazem qam, hydralazine 25 qid, renavite, oxycodone IR, duloxetine, prednisone, sevelamer carbonate 2 ac tid, prns/ vits/ supps        [facility]  3h 18mn  67kg  2/2 bath  450/1.5   P4  Hep none    LUA AVF (using upper aspects only per pt, recovering from surgery) - hectorol 2 ug iV tiw - venofer '50mg'$  weekly (just completed IV fe load on 9/12) - mircera 225 q2, last 9/12, due 9/26     Assessment/ Plan: Recurrent anemia - Hb 6.6 here, possible GI bleed. Since admit has had 3u prbcs. Seen by GI, for procedure tomorrow.  Hx UGIB - had multiple EGD's and interventions in Jan-Feb 2023 including GDA embolization 11/05/21 and embolization of the anterior and pancreaticoduodenal arcades on 11/16/21.  Hx HCV - eradicated, undetectable virus in Sept 2022.   ESRD - on HD TTS. Last HD was Saturday 9/16. Plan HD today.  HTN/ vol - cont BP meds here as needed, mild edema LE's Anemia esrd - Hb 6.6, getting prbcs. Esa not due until 9/26.  MBD ckd - CCa in  range, phos a bit high. Cont IV vdra w HD, and renvela as binder ac tid. Not getting vdra.      Rob Noralee Dutko 07/04/2022, 1:38 PM   Recent Labs  Lab 07/03/22 1257 07/03/22 1529 07/03/22 2039 07/04/22 0834  HGB 6.6*  --  6.9* 6.7*  ALBUMIN  --    < > 3.0* 3.0*  CALCIUM 8.1*  --   --  8.4*  PHOS  --   --  6.1* 6.8*  CREATININE  7.66*  --   --  8.47*  K 4.6  --   --  4.7   < > = values in this interval not displayed.   No results for input(s): "IRON", "TIBC", "FERRITIN" in the last 168 hours. Inpatient medications:  sodium chloride   Intravenous Once   bisacodyl  10 mg Oral Q8H   carvedilol  3.125 mg Oral BID WC   Chlorhexidine Gluconate Cloth  6 each Topical Q0600   diltiazem  360 mg Oral Daily   feeding supplement  237 mL Oral Once   fluticasone  2 spray Each Nare Daily   hydrALAZINE  25 mg Oral Q6H   multivitamin  1 tablet Oral QHS   pantoprazole (PROTONIX) IV  40 mg Intravenous Q12H   sevelamer carbonate  1,600 mg Oral TID WC    acetaminophen **OR** acetaminophen, albuterol, hydrALAZINE, ondansetron **OR** ondansetron (ZOFRAN) IV, mouth rinse, oxyCODONE

## 2022-07-04 NOTE — Hospital Course (Signed)
HPI per Dr. Gerre Scull Apostol is a 61 y.o. male with medical history significant of ESRD on dialysis TTS, treated hepatitis C, HTN, diastolic CHF, hx of polysubstance abuse, thoracic aortic aneurysm, hx of GI bleed and duodenal ulcer with IR embolization in 11/16/21 to bleeding duodenal ulcer who presented to ED for low hemoglobin. He had labs drawn at his dialysis center and was told to come to ED. He has no idea he was anemic, but states he has been tired lately x1 week and short of breath for 2 weeks. He denies any blood in stool, but states he noticed his stool get dark about 2 weeks ago. He has no N/V/D, no abdominal pain.    Denies any fever/chills, vision changes/headaches, chest pain or palpitations, abdominal pain, N/V/D. He also states he has had some swelling in his right that has been here since February.    He does smoke. 2-3 cigarettes/day. He does not drink alcohol. He does not drink excessive caffeine or use NSAIDs.    EGD history 11/05/21: EGD with large, bleeding duodenal ulcer treated with hemospray followed by GDA embolization by IR. Never took his PPI had recurrent bleeding. General surgery recommended exhausting all non surgical options as he is a poor operative risk.  11/14/21: EGD small HH, antral erythema, nonbleeding duodenal ulcer with adherent clot.  11/16/21: EGD. Clotted blood seen in gastric body. Non obstructing duodenal ulcer with spurting blood tx'd with epi, 3 endoclips, bipolar cautery without success and tx'd with hemospray 11/16/21: embolization of anterior and pancreaticoduodenal arcades.   He has continued to not take protonix or carafate and states he's never had this medication.    ER Course:  vitals: afebrile, bp: 175/94, HR: 75, RR: 17, oxygen: 99%RA Pertinent labs: hgb: 6.6, sodium: 128, BUN: 57, creatinine: 7.66, fecal occult positive CXR: cardiomegaly. No acute finding In ED: started on protonix drip. Transfused 1 unit PRBC. Nephrology and GI consulted.  TR[facility] asked to admit.

## 2022-07-04 NOTE — H&P (View-Only) (Signed)
[facility] Progress Note  CC:  GI bleed, "I want to eat"  Assessment: Admitted with recurrent, symptomatic anemia presenting with melena.  He has significant comorbidities including ESRD on dialysis with associated anemia of chronic kidney disease, HTN, HFrEF with an EF 50-55%, thoracic aortic aneurysm, multiple hospitalizations this year for acute hypoxic respiratory failure, history of colon cancer with resection in 2014, heroin abuse. It is unclear if he has had a surveillance colonoscopy since his resection.    He also has a history of bleeding gastric ulcer ultimately treated with embolization of GDA 11/05/21 and anterior and pancreaticoduodenal arcades 11/16/21. H pylori serology negative. No NSAIDs. Has not been using PPI therapy since that time.   Hemoglobin remains low despite 1 unit PRBC transfusion. Received 2 units in dialysis today.   EGD and colonoscopy recommended for evaluation.   Plan: - Clear liquid diet today - Bowel prep tonight in preparation for endoscopy - EGD and colonoscopy 07/05/22 - Continue serial hgb/hct with transfusion as indicated - Continue PPI therapy - Would he benefit from an anxiolytic while waiting for endoscopic evaluation?   Subjective: He has doubts about his ability to be on a liquid diet today. Trying to demand solid food. No overt bleeding. No family present.   Objective:  Vital signs in last 24 hours: Temp:  [97.4 F (36.3 C)-97.9 F (36.6 C)] 97.7 F (36.5 C) (09/19 1200) Pulse Rate:  [65-84] 84 (09/19 1200) Resp:  [13-22] 20 (09/19 1200) BP: (151-213)/(76-105) 176/85 (09/19 1200) SpO2:  [94 %-100 %] 99 % (09/19 1200) Weight:  [74.9 kg-77.9 kg] 74.9 kg (09/19 1200) Last BM Date : 07/03/22 General:   Alert, in NAD, appears chronically ill Heart:  Regular rate and rhythm; no murmurs Pulm: Clear anteriorly; no wheezing Abdomen:  Soft. Nontender. Nondistended. Normal bowel sounds. No rebound or guarding. LAD: No  inguinal or umbilical LAD Extremities:  Without edema. Neurologic:  Alert and  oriented x4;  grossly normal neurologically. Psych:  Alert and cooperative. Normal mood and affect.   Lab Results: Recent Labs    07/03/22 1257 07/03/22 2039 07/04/22 0834  WBC 5.4  --  6.7  HGB 6.6* 6.9* 6.7*  HCT 21.0* 21.4* 20.7*  PLT 259  --  243   BMET Recent Labs    07/03/22 1257 07/04/22 0834  NA 128* 130*  K 4.6 4.7  CL 87* 88*  CO2 26 24  GLUCOSE 90 77  BUN 57* 71*  CREATININE 7.66* 8.47*  CALCIUM 8.1* 8.4*   LFT Recent Labs    07/03/22 1529 07/03/22 2039 07/04/22 0834  PROT 7.1  --   --   ALBUMIN 3.2*   < > 3.0*  AST 26  --   --   ALT 11  --   --   ALKPHOS 99  --   --   BILITOT 0.8  --   --   BILIDIR 0.3*  --   --   IBILI 0.5  --   --    < > = values in this interval not displayed.   PT/INR Recent Labs    07/03/22 1529  LABPROT 14.2  INR 1.1   Hepatitis Panel Recent Labs    07/03/22 2039  HEPBSAG NON REACTIVE  HCVAB Reactive*    DG Chest 2 View  Result Date: 07/03/2022 CLINICAL DATA:  Shortness of breath EXAM: CHEST - 2 VIEW COMPARISON:  Previous studies including the examination of 04/07/2022 FINDINGS: Transverse diameter of heart is  increased. Central pulmonary vessels are prominent. There are no signs of alveolar pulmonary edema. Linear densities in right lower lung fields suggest scarring. There is no new focal pulmonary consolidation. There is no pleural effusion or pneumothorax. IMPRESSION: Cardiomegaly. Central pulmonary vessels are prominent without signs of pulmonary edema. Linear densities in right lower lung field have not changed, possibly scarring. No new focal pulmonary consolidation is seen. Electronically Signed   By: Elmer Picker M.D.   On: 07/03/2022 13:26      LOS: 0 days   [place]  07/04/2022, 1:17 PM

## 2022-07-04 NOTE — Progress Notes (Signed)
Received patient in bed to unit.  Alert and oriented.  Informed consent signed and in chart.   Treatment initiated: 0815 Treatment completed: 1200  Patient tolerated well.  Transported back to the room  Alert, without acute distress.  Hand-off given to patient's nurse.   Access used: AV fistula Access issues: none  Total UF removed: 3.9L Medication(s) given: none Post HD VS: 97.4,182/102,69,14,97% Post HD weight: 74.9kg   [facility]

## 2022-07-04 NOTE — Progress Notes (Addendum)
Pt receives out-pt HD at [facility] on [facility]. Pt arrives at 5:40 for 6:00 chair time. Will assist as needed.   [name] Renal Navigator [phone]

## 2022-07-05 ENCOUNTER — Encounter (HOSPITAL_COMMUNITY)
Admission: EM | Disposition: A | Discharge status: Home / Self Care | Payer: Self-pay | Source: Home / Self Care | Attending: Family Medicine

## 2022-07-05 ENCOUNTER — Inpatient Hospital Stay (HOSPITAL_COMMUNITY): Payer: Medicare Other | Admitting: Anesthesiology

## 2022-07-05 ENCOUNTER — Encounter (HOSPITAL_COMMUNITY): Payer: Self-pay | Admitting: Family Medicine

## 2022-07-05 ENCOUNTER — Inpatient Hospital Stay (HOSPITAL_COMMUNITY): Payer: Medicare Other

## 2022-07-05 DIAGNOSIS — K449 Diaphragmatic hernia without obstruction or gangrene: Secondary | ICD-10-CM | POA: Diagnosis not present

## 2022-07-05 DIAGNOSIS — N186 End stage renal disease: Secondary | ICD-10-CM | POA: Diagnosis not present

## 2022-07-05 DIAGNOSIS — K922 Gastrointestinal hemorrhage, unspecified: Secondary | ICD-10-CM | POA: Diagnosis not present

## 2022-07-05 DIAGNOSIS — I10 Essential (primary) hypertension: Secondary | ICD-10-CM | POA: Diagnosis not present

## 2022-07-05 DIAGNOSIS — Z980 Intestinal bypass and anastomosis status: Secondary | ICD-10-CM

## 2022-07-05 DIAGNOSIS — K3189 Other diseases of stomach and duodenum: Secondary | ICD-10-CM

## 2022-07-05 DIAGNOSIS — T183XXA Foreign body in small intestine, initial encounter: Secondary | ICD-10-CM | POA: Diagnosis not present

## 2022-07-05 DIAGNOSIS — I5042 Chronic combined systolic (congestive) and diastolic (congestive) heart failure: Secondary | ICD-10-CM | POA: Diagnosis not present

## 2022-07-05 DIAGNOSIS — R195 Other fecal abnormalities: Secondary | ICD-10-CM

## 2022-07-05 DIAGNOSIS — D62 Acute posthemorrhagic anemia: Secondary | ICD-10-CM

## 2022-07-05 DIAGNOSIS — J45909 Unspecified asthma, uncomplicated: Secondary | ICD-10-CM

## 2022-07-05 DIAGNOSIS — Z87891 Personal history of nicotine dependence: Secondary | ICD-10-CM

## 2022-07-05 HISTORY — PX: COLONOSCOPY WITH PROPOFOL: SHX5780

## 2022-07-05 HISTORY — PX: HEMOSTASIS CLIP PLACEMENT: SHX6857

## 2022-07-05 HISTORY — PX: ESOPHAGOGASTRODUODENOSCOPY (EGD) WITH PROPOFOL: SHX5813

## 2022-07-05 HISTORY — PX: HEMOSTASIS CONTROL: SHX6838

## 2022-07-05 HISTORY — PX: BIOPSY: SHX5522

## 2022-07-05 LAB — CBC
HCT: 27.6 % — ABNORMAL LOW (ref 39.0–52.0)
Hemoglobin: 9.2 g/dL — ABNORMAL LOW (ref 13.0–17.0)
MCH: 29.8 pg (ref 26.0–34.0)
MCHC: 33.3 g/dL (ref 30.0–36.0)
MCV: 89.3 fL (ref 80.0–100.0)
Platelets: 237 10*3/uL (ref 150–400)
RBC: 3.09 MIL/uL — ABNORMAL LOW (ref 4.22–5.81)
RDW: 20.9 % — ABNORMAL HIGH (ref 11.5–15.5)
WBC: 6.4 10*3/uL (ref 4.0–10.5)
nRBC: 0 % (ref 0.0–0.2)

## 2022-07-05 LAB — POCT I-STAT, CHEM 8
BUN: 39 mg/dL — ABNORMAL HIGH (ref 8–23)
Calcium, Ion: 0.86 mmol/L — CL (ref 1.15–1.40)
Chloride: 94 mmol/L — ABNORMAL LOW (ref 98–111)
Creatinine, Ser: 7.4 mg/dL — ABNORMAL HIGH (ref 0.61–1.24)
Glucose, Bld: 77 mg/dL (ref 70–99)
HCT: 30 % — ABNORMAL LOW (ref 39.0–52.0)
Hemoglobin: 10.2 g/dL — ABNORMAL LOW (ref 13.0–17.0)
Potassium: 3.9 mmol/L (ref 3.5–5.1)
Sodium: 129 mmol/L — ABNORMAL LOW (ref 135–145)
TCO2: 27 mmol/L (ref 22–32)

## 2022-07-05 LAB — RENAL FUNCTION PANEL
Albumin: 2.9 g/dL — ABNORMAL LOW (ref 3.5–5.0)
Anion gap: 18 — ABNORMAL HIGH (ref 5–15)
BUN: 46 mg/dL — ABNORMAL HIGH (ref 8–23)
CO2: 22 mmol/L (ref 22–32)
Calcium: 8.3 mg/dL — ABNORMAL LOW (ref 8.9–10.3)
Chloride: 90 mmol/L — ABNORMAL LOW (ref 98–111)
Creatinine, Ser: 7.32 mg/dL — ABNORMAL HIGH (ref 0.61–1.24)
GFR, Estimated: 8 mL/min — ABNORMAL LOW (ref >60)
Glucose, Bld: 68 mg/dL — ABNORMAL LOW (ref 70–99)
Phosphorus: 6.2 mg/dL — ABNORMAL HIGH (ref 2.5–4.6)
Potassium: 4.3 mmol/L (ref 3.5–5.1)
Sodium: 130 mmol/L — ABNORMAL LOW (ref 135–145)

## 2022-07-05 LAB — TYPE AND SCREEN
ABO/RH(D): B POS
Antibody Screen: NEGATIVE
Unit division: 0
Unit division: 0
Unit division: 0

## 2022-07-05 LAB — MAGNESIUM: Magnesium: 2.2 mg/dL (ref 1.7–2.4)

## 2022-07-05 LAB — BPAM RBC
Blood Product Expiration Date: 202310072359
Blood Product Expiration Date: 202310112359
Blood Product Expiration Date: 202310122359
ISSUE DATE / TIME: 202309181609
ISSUE DATE / TIME: 202309190906
ISSUE DATE / TIME: 202309190906
Unit Type and Rh: 7300
Unit Type and Rh: 7300
Unit Type and Rh: 7300

## 2022-07-05 LAB — HEPATITIS B SURFACE ANTIBODY, QUANTITATIVE: Hep B S AB Quant (Post): 26.1 m[IU]/mL (ref >9.9)

## 2022-07-05 SURGERY — ESOPHAGOGASTRODUODENOSCOPY (EGD) WITH PROPOFOL
Anesthesia: Monitor Anesthesia Care

## 2022-07-05 MED ORDER — HYDROMORPHONE HCL 1 MG/ML IJ SOLN
0.5000 mg | INTRAMUSCULAR | Status: AC | PRN
  Administered 2022-07-05 – 2022-07-06 (×2): 0.5 mg via INTRAVENOUS
  Filled 2022-07-05 (×2): qty 0.5

## 2022-07-05 MED ORDER — HYDRALAZINE HCL 10 MG PO TABS: 10.0000 mg | ORAL_TABLET | Freq: Three times a day (TID) | ORAL | Status: DC | PRN

## 2022-07-05 MED ORDER — CHLORHEXIDINE GLUCONATE CLOTH 2 % EX PADS: 6.0000 | MEDICATED_PAD | Freq: Every day | CUTANEOUS | Status: DC

## 2022-07-05 MED ORDER — GLYCOPYRROLATE 0.2 MG/ML IJ SOLN
INTRAMUSCULAR | Status: DC | PRN
  Administered 2022-07-05: .2 mg via INTRAVENOUS

## 2022-07-05 MED ORDER — SODIUM CHLORIDE 0.9 % IV SOLN: INTRAVENOUS | Status: DC | PRN

## 2022-07-05 MED ORDER — DIPHENHYDRAMINE HCL 50 MG/ML IJ SOLN
12.5000 mg | Freq: Once | INTRAMUSCULAR | Status: AC | PRN
  Administered 2022-07-05: 12.5 mg via INTRAVENOUS
  Filled 2022-07-05: qty 1

## 2022-07-05 MED ORDER — IOHEXOL 350 MG/ML SOLN
85.0000 mL | Freq: Once | INTRAVENOUS | Status: AC | PRN
  Administered 2022-07-05: 85 mL via INTRAVENOUS

## 2022-07-05 MED ORDER — PROPOFOL 500 MG/50ML IV EMUL
INTRAVENOUS | Status: DC | PRN
  Administered 2022-07-05: 75 ug/kg/min via INTRAVENOUS
  Administered 2022-07-05: 115 ug/kg/min via INTRAVENOUS

## 2022-07-05 MED ORDER — LIDOCAINE 2% (20 MG/ML) 5 ML SYRINGE
INTRAMUSCULAR | Status: DC | PRN
  Administered 2022-07-05: 70 mg via INTRAVENOUS

## 2022-07-05 MED ORDER — PROPOFOL 10 MG/ML IV BOLUS
INTRAVENOUS | Status: DC | PRN
  Administered 2022-07-05: 20 mg via INTRAVENOUS

## 2022-07-05 SURGICAL SUPPLY — 25 items

## 2022-07-05 NOTE — Progress Notes (Signed)
Patient has refused morning labs twice today despite education on the importance of them. MD Olevia Bowens aware.

## 2022-07-05 NOTE — Progress Notes (Addendum)
[facility] Progress Note  Subjective: for EGD / colonoscopy today  Vitals:   07/05/22 0648 07/05/22 0753 07/05/22 0906 07/05/22 0932  BP: (!) 181/93 (!) 176/98 (!) 180/100 (!) 175/96  Pulse:  71    Resp:  13 14   Temp:  98.7 F (37.1 C) 98 F (36.7 C)   TempSrc:  Oral Tympanic   SpO2:   97%   Weight:      Height:        Exam: Gen alert, no distress No jvd or bruits Chest clear bilat to bases RRR no MRG Abd soft ntnd no mass or ascites +bs Ext mild bilat pretib edema Neuro is alert, Ox 3 , nf    LUA AVF+bruit    Home meds include - albuterol, carvedilol 3.125 bid, diltiazem qam, hydralazine 25 qid, renavite, oxycodone IR, duloxetine, prednisone, sevelamer carbonate 2 ac tid, prns/ vits/ supps        [facility] HD: [facility]  3h 34mn  67kg  2/2 bath  450/1.5   P4  Hep none    LUA AVF (using upper aspects only per pt, recovering from surgery) - hectorol 2 ug iV tiw - venofer '50mg'$  weekly (just completed IV fe load on 9/12) - mircera 225 q2, last 9/12, due 9/26     Assessment/ Plan: Recurrent GI bleed - Hb 6.6 on admit, up to 9 today. Suspected recurrent GI bleed. Seen by GI, for EGD/ colon today.  Hx UGIB - had multiple EGD's and interventions including two upper GI embolizations by IR in Jan and Feb 2023.  Hx HCV - eradicated, undetectable virus Sept 2022.   ESRD - on HD [facility]. Had HD here 9/19. Next HD tomorrow 9/21. Has not missed HD.  HTN/ vol - cont BP meds here as needed, mild edema LE's, up 6kg by wts, needs standing wt before HD tomorrow Anemia esrd + abl - Hb 6s > 9.1 today sp 3u prbcs. Esa not due until 9/26.  MBD ckd - CCa in  range, phos a bit high. Cont IV vdra w HD, and renvela as binder ac tid.     Timothy [name] 07/05/2022, 9:44 AM   Recent Labs  Lab 07/03/22 1257 07/03/22 1529 07/03/22 2039 07/04/22 0834 07/04/22 1351  HGB 6.6*  --  6.9* 6.7* 9.1*  ALBUMIN  --    < > 3.0* 3.0*  --   CALCIUM 8.1*  --   --  8.4*  --   PHOS  --   --  6.1*  6.8*  --   CREATININE 7.66*  --   --  8.47*  --   K 4.6  --   --  4.7  --    < > = values in this interval not displayed.    No results for input(s): "IRON", "TIBC", "FERRITIN" in the last 168 hours. Inpatient medications:  [MAR Hold] sodium chloride   Intravenous Once   [MAR Hold] carvedilol  3.125 mg Oral BID WC   [MAR Hold] Chlorhexidine Gluconate Cloth  6 each Topical Q0600   [MAR Hold] diltiazem  360 mg Oral Daily   [MAR Hold] doxercalciferol  2 mcg Intravenous Q T,Th,Sa-HD   [MAR Hold] feeding supplement  237 mL Oral Once   [MAR Hold] fluticasone  2 spray Each Nare Daily   [MAR Hold] hydrALAZINE  25 mg Oral Q6H   [MAR Hold] multivitamin  1 tablet Oral QHS   [MAR Hold] pantoprazole (PROTONIX) IV  40 mg Intravenous Q12H   [  MAR Hold] sevelamer carbonate  1,600 mg Oral TID WC    [MAR Hold] acetaminophen **OR** [MAR Hold] acetaminophen, [MAR Hold] albuterol, [MAR Hold] hydrALAZINE, [MAR Hold] hydrALAZINE, [MAR Hold] ondansetron **OR** [MAR Hold] ondansetron (ZOFRAN) IV, [MAR Hold] mouth rinse, [MAR Hold] oxyCODONE

## 2022-07-05 NOTE — Op Note (Signed)
[facility] Patient Name: Timothy [name] Procedure Date : 07/05/2022 MRN: [id number] Attending MD: Thornton Park MD, MD Date of Birth: August 08, 1961 CSN: [id number] Age: 61 Admit Type: Inpatient Procedure:                Colonoscopy Indications:              Unexplained anemia with hemoccult positive stools,                            history of colon cancer with resection in 2014, no                            surveillance colonoscopy performed since surgery Providers:                Thornton Park MD, MD, Grace Isaac, RN, [facility] Technician, Technician Referring MD:              Medicines:                Monitored Anesthesia Care Complications:            No immediate complications. Estimated Blood Loss:     Estimated blood loss: none. Procedure:                Pre-Anesthesia Assessment:                           - Prior to the procedure, a History and Physical                            was performed, and patient medications and                            allergies were reviewed. The patient's tolerance of                            previous anesthesia was also reviewed. The risks                            and benefits of the procedure and the sedation                            options and risks were discussed with the patient.                            All questions were answered, and informed consent                            was obtained. Prior Anticoagulants: The patient has                            taken no previous anticoagulant or antiplatelet  agents. ASA Grade Assessment: III - A patient with                            severe systemic disease. After reviewing the risks                            and benefits, the patient was deemed in                            satisfactory condition to undergo the procedure.                           After obtaining informed consent, the colonoscope                             was passed under direct vision. Throughout the                            procedure, the patient's blood pressure, pulse, and                            oxygen saturations were monitored continuously. The                            CF-HQ190L ([id number]) Olympus coloscope was                            introduced through the anus and advanced to the the                            ileocolonic anastomosis. The colonoscopy was                            performed with moderate difficulty due to poor                            bowel prep. The patient tolerated the procedure                            well. The quality of the bowel preparation was                            poor. The terminal ileum, ileocecal valve,                            appendiceal orifice, and rectum were photographed. Scope In: 11:15:48 AM Scope Out: 11:38:41 AM Scope Withdrawal Time: 0 hours 7 minutes 21 seconds  Total Procedure Duration: 0 hours 22 minutes 53 seconds  Findings:      The perianal and digital rectal examinations were normal.      Stool was found in the entire colon limiting any meaningful evaluation       for colon polyps or flat mucosal abnormalities. There was no bleeding       seend.  There was evidence of a prior end-to-end ileo-colonic anastomosis in the       ascending colon. This was patent.      The terminal ileum appeared normal. Impression:               - Preparation of the colon was poor with stool                            present in the entire examined colon.                           - Patent end-to-end ileo-colonic anastomosis.                           - No obvious source of anemia identified on this                            study. Recommendation:           - Return patient to hospital ward for ongoing care.                           - Clear liquid diet.                           - Continue present medications.                           - Repeat colonoscopy for  colon cancer surveillance                            to be scheduled as an outpatient because the bowel                            preparation was poor. Procedure Code(s):        --- Professional ---                           [phone], Colonoscopy, flexible; diagnostic, including                            collection of specimen(s) by brushing or washing,                            when performed (separate procedure) Diagnosis Code(s):        --- Professional ---                           Z98.0, Intestinal bypass and anastomosis status                           D62, Acute posthemorrhagic anemia CPT copyright 2019 [employer]. All rights reserved. The codes documented in this report are preliminary and upon coder review may  be revised to meet current compliance requirements. Thornton Park MD, MD 07/05/2022 11:52:52 AM This report has been signed electronically. Number of Addenda: 0

## 2022-07-05 NOTE — Op Note (Addendum)
[facility] Patient Name: Timothy [name] Procedure Date : 07/05/2022 MRN: [id number] Attending MD: Thornton Park MD, MD Date of Birth: [birth year] CSN: [id number] Age: 62 Admit Type: Inpatient Procedure:                Upper GI endoscopy Indications:              Progressive anemia with evidence for occult GI                            bleeding, history of duodenal ulcers s/p GDA                            embolization 10/2021 and embolization of the                            anterior and pancreaticoduodenal arcades 2/23 Providers:                Thornton Park MD, MD, Grace Isaac, RN, [facility] Technician, Technician Referring MD:              Medicines:                Monitored Anesthesia Care Complications:            No immediate complications. Estimated Blood Loss:     Estimated blood loss was minimal. Procedure:                Pre-Anesthesia Assessment:                           - Prior to the procedure, a History and Physical                            was performed, and patient medications and                            allergies were reviewed. The patient's tolerance of                            previous anesthesia was also reviewed. The risks                            and benefits of the procedure and the sedation                            options and risks were discussed with the patient.                            All questions were answered, and informed consent                            was obtained. Prior Anticoagulants: The patient has  taken no previous anticoagulant or antiplatelet                            agents. ASA Grade Assessment: III - A patient with                            severe systemic disease. After reviewing the risks                            and benefits, the patient was deemed in                            satisfactory condition to undergo the procedure.                            After obtaining informed consent, the endoscope was                            passed under direct vision. Throughout the                            procedure, the patient's blood pressure, pulse, and                            oxygen saturations were monitored continuously. The                            GIF-H190 ([id number]) Olympus endoscope was introduced                            through the mouth, and advanced to the third part                            of duodenum. The upper GI endoscopy was                            accomplished without difficulty. The patient                            tolerated the procedure well. Scope In: Scope Out: Findings:      The esophagus was normal.      A small hiatal hernia was present.      Erythema consistent with gastropathy and a subtle verrucous appearance       to the gastric mucosa in the atrum. The remainder of the examined       stomach was normal. No blood present.      Foreign bodies present in the duodenal bulb and the 2nd portion of the       duodenum in the area of prior embolization. ? coils. No bleeding noted       around the foreign bodies. No associated ulceration. No blood present in       the duodenal bulb.      A single 13m mucosal abnormality was found proximal to the ampulla.       Etiology unclear but likely  to be related to the bleeding. This was       biopsied with a cold forceps for histology. Persistent oozing occurred       after the biopsy. For hemostasis and future localization, three       hemostatic clips were placed. With each clip, the bleeding initially       appeared to improve but then oozing resumed. The surrounding mucosa       appeared macerated after clip placement. To stop active bleeding,       hemostatic spray was deployed. Several sprays were applied. There was no       bleeding at the end of the procedure. Impression:               - Normal esophagus.                           - Small hiatal  hernia.                           - Normal stomach.                           - Suspected coils eroding through the duodenum at                            the site of prior embolization. Not actively                            bleeding around the coils.                           - Focal area of oozing proximal to the ampulla.                            Biopsied. Clipped to stop bleeding. Hemostatic                            spray applied. Recommendation:           - Return patient to hospital ward for ongoing care.                           - NPO for at least 48 hours given the Hemospray.                           - Serial hgb/hct with transfusion as indicated.                           - Continue present medications including                            pantoprazole.                           - Await pathology results.                           - CTA today to further evaluation  the duodenal bulb                            and second portion of the duodenum. Procedure Code(s):        --- Professional ---                           [id number], 4, Esophagogastroduodenoscopy, flexible,                            transoral; with control of bleeding, any method                           43239, 51, Esophagogastroduodenoscopy, flexible,                            transoral; with biopsy, single or multiple Diagnosis Code(s):        --- Professional ---                           K44.9, Diaphragmatic hernia without obstruction or                            gangrene                           T18.3XXS, Foreign body in small intestine, sequela                           K31.89, Other diseases of stomach and duodenum CPT copyright 2019 American [employer] Association. All rights reserved. The codes documented in this report are preliminary and upon coder review may  be revised to meet current compliance requirements. Thornton Park MD, MD 07/05/2022 12:06:10 PM This report has been signed  electronically. Number of Addenda: 0

## 2022-07-05 NOTE — Transfer of Care (Signed)
Immediate Anesthesia Transfer of Care Note  Patient: Timothy [name]  Procedure(s) Performed: ESOPHAGOGASTRODUODENOSCOPY (EGD) WITH PROPOFOL COLONOSCOPY WITH PROPOFOL BIOPSY HEMOSTASIS CLIP PLACEMENT  Patient Location: PACU  Anesthesia Type:MAC  Level of Consciousness: drowsy  Airway & Oxygen Therapy: Patient Spontanous Breathing and Patient connected to nasal cannula oxygen  Post-op Assessment: Report given to RN and Post -op Vital signs reviewed and stable  Post vital signs: Reviewed and stable  Last Vitals:  Vitals Value Taken Time  BP 161/79 07/05/22 1145  Temp    Pulse 78 07/05/22 1147  Resp 21 07/05/22 1147  SpO2 94 % 07/05/22 1147  Vitals shown include unvalidated device data.  Last Pain:  Vitals:   07/05/22 0906  TempSrc: Tympanic  PainSc: 0-No pain      Patients Stated Pain Goal: 0 (57/49/35 [id number])  Complications: No notable events documented.

## 2022-07-05 NOTE — Progress Notes (Signed)
Patient refusing telemetry for this shift. Dr. Myna Hidalgo notified.

## 2022-07-05 NOTE — Progress Notes (Signed)
[facility] PROGRESS NOTE    Progress Note  Timothy [name]  MWU:[id number] DOB: Aug 30, 1961 DOA: 07/03/2022 PCP: [name], MD     Brief Narrative:   Timothy [name] is an 61 y.o. male past medical history significant for end-stage renal disease on hemodialysis, treated hep C, essential hypertension, chronic diastolic heart failure, history of polysubstance abuse, thoracic aortic aneurysm, history of GI bleed with a duodenal ulcer status post IR embolization in November 16, 2021 comes in for low hemoglobin for labs drawn in dialysis.  Denies any nausea vomiting abdominal pain.  Continues to use tobacco daily denies any caffeine or NSAID use.  Significant studies: EGD history 11/05/21: EGD with large, bleeding duodenal ulcer treated with hemospray followed by GDA embolization by IR. Never took his PPI had recurrent bleeding. General surgery recommended exhausting all non surgical options as he is a poor operative risk.  11/14/21: EGD small HH, antral erythema, nonbleeding duodenal ulcer with adherent clot.  11/16/21: EGD. Clotted blood seen in gastric body. Non obstructing duodenal ulcer with spurting blood tx'd with epi, 3 endoclips, bipolar cautery without success and tx'd with hemospray 11/16/21: embolization of anterior and pancreaticoduodenal arcades.   He has continued to not take protonix or carafate and states he's never had this medication.    Assessment/Plan:   Acute GI bleeding Noncompliant with his PPI with melanotic stools for the past 2 weeks. FOBT positive GI consulted recommended clear liquid diet. Hemoglobin admission 6.6 status post 3 unit of packed red blood cells hemoglobin this morning is 9. Continue IV PPI twice a day. For EGD and colonoscopy on 07/05/2022 after hemodialysis. Keep n.p.o.  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 Appreciate nephrology's assistance.  Hyponatremia Further management per renal.  Paroxysmal A-fib (HCC) Currently in sinus rhythm not on anticoagulation due to history of GI bleed. Continue Coreg and diltiazem, twelve-lead EKG is noted to have first-degree AV block.  Essential hypertension: Continue Coreg diltiazem and hydralazine.  Blood pressure significantly elevated use hydralazine as needed postintervention.  Chronic combined systolic and diastolic heart failure: Volume management per renal continue Coreg. 2D echo in May 2023 showed an EF of 55% with grade 2 diastolic heart failure.  Will mild tricuspid regurg.  History of colon cancer: Status post colectomy in 2014.  He was scheduled to have a colonoscopy in February 2023 but was not done due to active bleeding. For colonoscopy today.  Treated hepatitis C: Status post eradication.  Polysubstance abuse: Remote history of heroin, continues to use tobacco.    DVT prophylaxis: scd Family Communication:none Status is: Inpatient Remains inpatient appropriate because: Acute GI bleed    Code Status:     Code Status Orders  (From admission, onward)           Start     Ordered   07/03/22 1643  Full code  Continuous        07/03/22 1643           Code Status History     Date Active Date Inactive Code Status Order ID Comments User Context   04/06/2022 2236 04/10/2022 0002 Full Code [id number]  Germain Osgood, PA-C ED   03/13/2022 2131 03/15/2022 1829 Full Code [id number]  Toy Baker, MD ED   02/13/2022 2152 02/15/2022 2056 DNR [id number]  Toy Baker,  MD ED   12/30/2021 1601 01/01/2022 1726 DNR [id number]  Dorethea Clan, DO Inpatient   11/12/2021 2004 12/03/2021 0323 Full Code [id number]  Lequita Halt, MD Inpatient   11/05/2021 0250 11/09/2021 1736 Full Code [id number]  Etta Quill, DO ED   10/31/2021 2142 11/02/2021 1742 Full Code  [id number]  Howerter, Ethelda Chick, DO ED   10/05/2021 1507 10/09/2021 1459 Full Code [id number]  Karmen Bongo, MD ED   06/11/2021 2307 06/25/2021 2149 Full Code [id number]  Riesa Pope, MD ED   05/20/2021 1155 05/28/2021 0433 Full Code [id number]  Jose Persia, MD ED   01/13/2018 1714 01/18/2018 1613 Full Code [id number]  Elwin Mocha, MD ED   09/09/2016 0931 09/21/2016 1437 Full Code [id number]  de Flo Shanks, MD ED   07/17/2016 1829 07/22/2016 2117 Full Code [id number]  Geradine Girt, DO Inpatient         IV Access:   Peripheral IV   Procedures and diagnostic studies:   DG Chest 2 View  Result Date: 07/03/2022 CLINICAL DATA:  Shortness of breath EXAM: CHEST - 2 VIEW COMPARISON:  Previous studies including the examination of 04/07/2022 FINDINGS: Transverse diameter of heart is increased. Central pulmonary vessels are prominent. There are no signs of alveolar pulmonary edema. Linear densities in right lower lung fields suggest scarring. There is no new focal pulmonary consolidation. There is no pleural effusion or pneumothorax. IMPRESSION: Cardiomegaly. Central pulmonary vessels are prominent without signs of pulmonary edema. Linear densities in right lower lung field have not changed, possibly scarring. No new focal pulmonary consolidation is seen. Electronically Signed   By: Elmer Picker M.D.   On: 07/03/2022 13:26     Medical Consultants:   None.   Subjective:    Timothy [name] denies any abdominal pain relates he is hungry.  Objective:    Vitals:   07/04/22 1944 07/04/22 2256 07/05/22 0427 07/05/22 0648  BP: (!) 165/113 (!) 148/99 (!) 167/86 (!) 181/93  Pulse: 73  75   Resp: 15  13   Temp: 97.6 F (36.4 C)  98.6 F (37 C)   TempSrc: Oral  Oral   SpO2: 99%     Weight:      Height:       SpO2: 99 %   Intake/Output Summary (Last 24 hours) at 07/05/2022 0659 Last data filed at 07/04/2022 1800 Gross per 24 hour  Intake 1980 ml  Output 3.9 ml   Net 1976.1 ml   Filed Weights   07/03/22 1237 07/04/22 0814 07/04/22 1200  Weight: 73.9 kg 77.9 kg 74.9 kg    Exam: General exam: In no acute distress. Respiratory system: Good air movement and clear to auscultation. Cardiovascular system: S1 & S2 heard, RRR. No JVD. Gastrointestinal system: Abdomen is nondistended, soft and nontender.  Extremities: No pedal edema. Skin: No rashes, lesions or ulcers Psychiatry: Judgement and insight appear normal. Mood & affect appropriate.    Data Reviewed:    Labs: Basic Metabolic Panel: Recent Labs  Lab 07/03/22 1257 07/03/22 2039 07/04/22 0834  NA 128*  --  130*  K 4.6  --  4.7  CL 87*  --  88*  CO2 26  --  24  GLUCOSE 90  --  77  BUN 57*  --  71*  CREATININE 7.66*  --  8.47*  CALCIUM 8.1*  --  8.4*  PHOS  --  6.1* 6.8*   GFR Estimated Creatinine Clearance: 9.7 mL/min (A) (by C-G formula  based on SCr of 8.47 mg/dL (H)). Liver Function Tests: Recent Labs  Lab 07/03/22 1529 07/03/22 2039 07/04/22 0834  AST 26  --   --   ALT 11  --   --   ALKPHOS 99  --   --   BILITOT 0.8  --   --   PROT 7.1  --   --   ALBUMIN 3.2* 3.0* 3.0*   No results for input(s): "LIPASE", "AMYLASE" in the last 168 hours. No results for input(s): "AMMONIA" in the last 168 hours. Coagulation profile Recent Labs  Lab 07/03/22 1529  INR 1.1   COVID-19 Labs  No results for input(s): "DDIMER", "FERRITIN", "LDH", "CRP" in the last 72 hours.  Lab Results  Component Value Date   SARSCOV2NAA NEGATIVE 04/06/2022   SARSCOV2NAA NEGATIVE 12/30/2021   SARSCOV2NAA NEGATIVE 11/12/2021   [place] NEGATIVE 11/04/2021    CBC: Recent Labs  Lab 07/03/22 1257 07/03/22 2039 07/04/22 0834 07/04/22 1351  WBC 5.4  --  6.7 6.9  NEUTROABS 3.5  --   --  5.7  HGB 6.6* 6.9* 6.7* 9.1*  HCT 21.0* 21.4* 20.7* 27.6*  MCV 98.1  --  92.4 89.3  PLT 259  --  243 243   Cardiac Enzymes: No results for input(s): "CKTOTAL", "CKMB", "CKMBINDEX", "TROPONINI" in  the last 168 hours. BNP (last 3 results) No results for input(s): "PROBNP" in the last 8760 hours. CBG: No results for input(s): "GLUCAP" in the last 168 hours. D-Dimer: No results for input(s): "DDIMER" in the last 72 hours. Hgb A1c: No results for input(s): "HGBA1C" in the last 72 hours. Lipid Profile: No results for input(s): "CHOL", "HDL", "LDLCALC", "TRIG", "CHOLHDL", "LDLDIRECT" in the last 72 hours. Thyroid function studies: No results for input(s): "TSH", "T4TOTAL", "T3FREE", "THYROIDAB" in the last 72 hours.  Invalid input(s): "FREET3" Anemia work up: No results for input(s): "VITAMINB12", "FOLATE", "FERRITIN", "TIBC", "IRON", "RETICCTPCT" in the last 72 hours. Sepsis Labs: Recent Labs  Lab 07/03/22 1257 07/04/22 0834 07/04/22 1351  WBC 5.4 6.7 6.9   Microbiology Recent Results (from the past 240 hour(s))  MRSA Next Gen by PCR, Nasal     Status: None   Collection Time: 07/03/22  7:38 PM   Specimen: Nasal Mucosa; Nasal Swab  Result Value Ref Range Status   MRSA by PCR Next Gen NOT DETECTED NOT DETECTED Final    Comment: (NOTE) The GeneXpert MRSA Assay (FDA approved for NASAL specimens only), is one component of a comprehensive MRSA colonization surveillance program. It is not intended to diagnose MRSA infection nor to guide or monitor treatment for MRSA infections. Test performance is not FDA approved in patients less than 25 years old. Performed at [facility], [place] [address], [place], [place] [address]      Medications:    sodium chloride   Intravenous Once   carvedilol  3.125 mg Oral BID WC   Chlorhexidine Gluconate Cloth  6 each Topical Q0600   diltiazem  360 mg Oral Daily   [START ON 07/06/2022] doxercalciferol  2 mcg Intravenous Q T,Th,Sa-HD   feeding supplement  237 mL Oral Once   fluticasone  2 spray Each Nare Daily   hydrALAZINE  25 mg Oral Q6H   multivitamin  1 tablet Oral QHS   pantoprazole (PROTONIX) IV  40 mg Intravenous Q12H    sevelamer carbonate  1,600 mg Oral TID WC   Continuous Infusions:    LOS: 1 day   Charlynne Cousins  [facility]  07/05/2022, 6:59 AM

## 2022-07-05 NOTE — Progress Notes (Signed)
Patient refusing to put telemetry box back on, MD Olevia Bowens aware.

## 2022-07-05 NOTE — Interval H&P Note (Signed)
History and Physical Interval Note:  07/05/2022 9:54 AM  Timothy [name]  has presented today for surgery, with the diagnosis of Blood loss anemia.  Reported right hemicolectomy for colon cancer in 2014 and no subsequent colonoscopy.  Complicated PUD in January 2023..  The various methods of treatment have been discussed with the patient and family. After consideration of risks, benefits and other options for treatment, the patient has consented to  Procedure(s) with comments: ESOPHAGOGASTRODUODENOSCOPY (EGD) WITH PROPOFOL (N/A) COLONOSCOPY WITH PROPOFOL (N/A) - This is in addition to previously ordered upper endoscopy. as a surgical intervention.  The patient's history has been reviewed, patient examined, no change in status, stable for surgery.  I have reviewed the patient's chart and labs.  Questions were answered to the patient's satisfaction.     [place]

## 2022-07-05 NOTE — Anesthesia Procedure Notes (Signed)
Procedure Name: MAC Date/Time: 07/05/2022 10:41 AM  Performed by: Lowella Dell, CRNAPre-anesthesia Checklist: Patient identified, Emergency Drugs available, Suction available, Patient being monitored and Timeout performed Patient Re-evaluated:Patient Re-evaluated prior to induction Oxygen Delivery Method: Nasal cannula Placement Confirmation: positive ETCO2 Dental Injury: Teeth and Oropharynx as per pre-operative assessment

## 2022-07-05 NOTE — Progress Notes (Signed)
Patient allowed to have ice chips per oncall GI.  Hold all PO medications.

## 2022-07-05 NOTE — Anesthesia Postprocedure Evaluation (Signed)
Anesthesia Post Note  Patient: Olin Gurski  Procedure(s) Performed: ESOPHAGOGASTRODUODENOSCOPY (EGD) WITH PROPOFOL COLONOSCOPY WITH PROPOFOL BIOPSY HEMOSTASIS CLIP PLACEMENT     Patient location during evaluation: PACU Anesthesia Type: MAC Level of consciousness: awake and alert, patient cooperative and oriented Pain management: pain level controlled Vital Signs Assessment: post-procedure vital signs reviewed and stable Respiratory status: spontaneous breathing, nonlabored ventilation and respiratory function stable Cardiovascular status: stable and blood pressure returned to baseline Postop Assessment: no apparent nausea or vomiting Anesthetic complications: no   No notable events documented.  Last Vitals:  Vitals:   07/05/22 1200 07/05/22 1210  BP: (!) 163/85   Pulse: 75   Resp: 17   Temp:  (!) 36.1 C  SpO2: 98%     Last Pain:  Vitals:   07/05/22 1210  TempSrc:   PainSc: 0-No pain                 Suzana Sohail,E. Aliviana Burdell

## 2022-07-05 NOTE — Anesthesia Preprocedure Evaluation (Addendum)
Anesthesia Evaluation  Patient identified by MRN, date of birth, ID band Patient awake    Reviewed: Allergy & Precautions, NPO status , Patient's Chart, lab work & pertinent test results, reviewed documented beta blocker date and time   History of Anesthesia Complications Negative for: history of anesthetic complications  Airway Mallampati: II  TM Distance: >3 FB Neck ROM: Full    Dental  (+) Dental Advisory Given   Pulmonary asthma , Current Smoker and Patient abstained from smoking.,    Pulmonary exam normal        Cardiovascular hypertension, Pt. on home beta blockers and Pt. on medications pulmonary hypertensionNormal cardiovascular exam+ Valvular Problems/Murmurs    '23 TTE - EF 50 to 55%. The left ventricular internal cavity size was mildly dilated. There is moderate left ventricular hypertrophy. Grade II diastolic dysfunction (pseudonormalization). Right ventricular systolic function is mildly reduced. The right ventricular size is mildly enlarged. There is moderately elevated pulmonary artery systolic pressure. The estimated right ventricular  systolic pressure is 51.7 mmHg. Left atrial size was moderately dilated. Right atrial size was mildly dilated. Mild mitral valve regurgitation. Tricuspid valve regurgitation is moderate to severe. Aortic valve regurgitation is trivial. There is mild dilatation of the ascending aorta, measuring 38 mm.     Neuro/Psych  Headaches, negative psych ROS   GI/Hepatic negative GI ROS, (+)     substance abuse  marijuana use and IV drug use, Hepatitis -, C S/p right hemicolectomy    Endo/Other   Na 130 Cl 88 P 6.8   Renal/GU ESRF and DialysisRenal disease     Musculoskeletal negative musculoskeletal ROS (+) narcotic dependent  Abdominal   Peds  Hematology  (+) Blood dyscrasia, anemia ,   Anesthesia Other Findings   Reproductive/Obstetrics                             Anesthesia Physical Anesthesia Plan  ASA: 3  Anesthesia Plan: MAC   Post-op Pain Management: Minimal or no pain anticipated   Induction:   PONV Risk Score and Plan: 0 and Propofol infusion and Treatment may vary due to age or medical condition  Airway Management Planned: Nasal Cannula and Natural Airway  Additional Equipment: None  Intra-op Plan:   Post-operative Plan:   Informed Consent: I have reviewed the patients History and Physical, chart, labs and discussed the procedure including the risks, benefits and alternatives for the proposed anesthesia with the patient or authorized representative who has indicated his/her understanding and acceptance.       Plan Discussed with: CRNA and Anesthesiologist  Anesthesia Plan Comments:        Anesthesia Quick Evaluation

## 2022-07-06 DIAGNOSIS — I10 Essential (primary) hypertension: Secondary | ICD-10-CM | POA: Diagnosis not present

## 2022-07-06 DIAGNOSIS — K922 Gastrointestinal hemorrhage, unspecified: Secondary | ICD-10-CM | POA: Diagnosis not present

## 2022-07-06 DIAGNOSIS — I5042 Chronic combined systolic (congestive) and diastolic (congestive) heart failure: Secondary | ICD-10-CM | POA: Diagnosis not present

## 2022-07-06 DIAGNOSIS — N186 End stage renal disease: Secondary | ICD-10-CM | POA: Diagnosis not present

## 2022-07-06 LAB — CBC
HCT: 26 % — ABNORMAL LOW (ref 39.0–52.0)
Hemoglobin: 8.5 g/dL — ABNORMAL LOW (ref 13.0–17.0)
MCH: 29.4 pg (ref 26.0–34.0)
MCHC: 32.7 g/dL (ref 30.0–36.0)
MCV: 90 fL (ref 80.0–100.0)
Platelets: 244 10*3/uL (ref 150–400)
RBC: 2.89 MIL/uL — ABNORMAL LOW (ref 4.22–5.81)
RDW: 20.4 % — ABNORMAL HIGH (ref 11.5–15.5)
WBC: 5.9 10*3/uL (ref 4.0–10.5)
nRBC: 0 % (ref 0.0–0.2)

## 2022-07-06 LAB — BASIC METABOLIC PANEL
Anion gap: 15 (ref 5–15)
BUN: 50 mg/dL — ABNORMAL HIGH (ref 8–23)
CO2: 23 mmol/L (ref 22–32)
Calcium: 8.2 mg/dL — ABNORMAL LOW (ref 8.9–10.3)
Chloride: 89 mmol/L — ABNORMAL LOW (ref 98–111)
Creatinine, Ser: 8.04 mg/dL — ABNORMAL HIGH (ref 0.61–1.24)
GFR, Estimated: 7 mL/min — ABNORMAL LOW (ref >60)
Glucose, Bld: 66 mg/dL — ABNORMAL LOW (ref 70–99)
Potassium: 4.3 mmol/L (ref 3.5–5.1)
Sodium: 127 mmol/L — ABNORMAL LOW (ref 135–145)

## 2022-07-06 LAB — HEPATITIS B SURFACE ANTIBODY, QUANTITATIVE: Hep B S AB Quant (Post): 29.3 m[IU]/mL (ref >9.9)

## 2022-07-06 LAB — SURGICAL PATHOLOGY

## 2022-07-06 MED ORDER — DIPHENHYDRAMINE HCL 25 MG PO CAPS
25.0000 mg | ORAL_CAPSULE | Freq: Once | ORAL | Status: AC
  Administered 2022-07-06: 25 mg via ORAL
  Filled 2022-07-06: qty 1

## 2022-07-06 MED ORDER — DIPHENHYDRAMINE HCL 50 MG/ML IJ SOLN
INTRAMUSCULAR | Status: AC
  Filled 2022-07-06: qty 1

## 2022-07-06 MED ORDER — HYDROMORPHONE HCL 1 MG/ML IJ SOLN
INTRAMUSCULAR | Status: AC
  Filled 2022-07-06: qty 0.5

## 2022-07-06 MED ORDER — HYDRALAZINE HCL 20 MG/ML IJ SOLN
10.0000 mg | Freq: Three times a day (TID) | INTRAMUSCULAR | Status: DC | PRN
  Administered 2022-07-06 – 2022-07-11 (×2): 10 mg via INTRAVENOUS
  Filled 2022-07-06 (×2): qty 1

## 2022-07-06 MED ORDER — DIPHENHYDRAMINE HCL 50 MG/ML IJ SOLN
25.0000 mg | Freq: Once | INTRAMUSCULAR | Status: AC
  Administered 2022-07-06: 25 mg via INTRAVENOUS

## 2022-07-06 MED ORDER — HYDROMORPHONE HCL 1 MG/ML IJ SOLN
0.5000 mg | Freq: Once | INTRAMUSCULAR | Status: AC
  Administered 2022-07-06: 0.5 mg via INTRAVENOUS

## 2022-07-06 NOTE — Progress Notes (Signed)
Weight was taken at 0716, late documentation d/t pt care

## 2022-07-06 NOTE — Progress Notes (Signed)
Received patient in bed to unit.  Alert and oriented.  Informed consent signed and in chart.   Treatment initiated: 0730 Treatment completed:   Patient tolerated well.  Transported back to the room  Alert, without acute distress.  Hand-off given to patient's nurse.   Access used: fistula left arm  Access issues: none  Total UF removed: 3.5 liters Medication(s) given: dilaudid 0.'5mg'$ , hectorol Post HD VS: 182/101 MAP 125 HR 78 RR 20 Sat 95% on room air Temp oral 98.2 Post HD weight: 69.7 kg bed wt    [name]

## 2022-07-06 NOTE — Plan of Care (Signed)

## 2022-07-06 NOTE — Progress Notes (Signed)
[facility] Progress Note  Subjective: had EGD yest w/ suspected coils eroding thru the duodenum at the site of prior embolization. Wasn't actively bleeding. Focal oozing prox to ampulla, this was bx's and clipped to stop the bleeding.   Vitals:   07/06/22 1030 07/06/22 1100 07/06/22 1125 07/06/22 1153  BP: (!) 180/94 (!) 177/98 (!) 182/101 (!) 161/102  Pulse: 72 74 77   Resp: '17 15 20 18  '$ Temp:   98.2 F (36.8 C) 97.7 F (36.5 C)  TempSrc:   Oral Oral  SpO2: 93% 96% 95% 96%  Weight:   69.7 kg   Height:        Exam: Gen alert, no distress No jvd or bruits Chest clear bilat to bases RRR no MRG Abd soft ntnd no mass or ascites +bs Ext mild bilat pretib edema Neuro is alert, Ox 3 , nf    LUA AVF+bruit    Home meds include - albuterol, carvedilol 3.125 bid, diltiazem qam, hydralazine 25 qid, renavite, oxycodone IR, duloxetine, prednisone, sevelamer carbonate 2 ac tid, prns/ vits/ supps        OP HD: [facility]  3h 91mn  67kg  2/2 bath  450/1.5   P4  Hep none    LUA AVF (using upper aspects only per pt, recovering from surgery) - hectorol 2 ug iV tiw - venofer '50mg'$  weekly (just completed IV fe load on 9/12) - mircera 225 q2, last 9/12, due 9/26     Assessment/ Plan: Recurrent GI bleed - Hb 6.6 on admit, up to 9 today. EGD showed probable embolization coils eroding thru the duodenum but not bleeding. Some oozing prox to ampulla was biopsied and clipped. Colon poor prep. Hb 8.6 today. Per GI/ pmd.  Hx UGIB - had multiple EGD's and interventions including two upper GI embolizations by IR in Jan and Feb 2023.  ESRD - on HD TTS. Had HD here 9/19. HD this am. Next HD Sat.  HTN/ vol - weights up, mild edema LE's, tolerated 3.5 L UF today Anemia esrd + abl - Hb 6s > 9.1 today sp 3u prbcs. Esa not due until 9/26.  MBD ckd - CCa in  range, phos a bit high. Cont IV vdra w HD, and renvela as binder ac tid.  Hx HCV - eradicated, undetectable virus Sept 2022.     Timothy [name]  Mykayla Brinton 07/06/2022, 3:47 PM   Recent Labs  Lab 07/04/22 0834 07/04/22 1351 07/05/22 1853 07/06/22 0738  HGB 6.7*   < > 9.2* 8.5*  ALBUMIN 3.0*  --  2.9*  --   CALCIUM 8.4*  --  8.3* 8.2*  PHOS 6.8*  --  6.2*  --   CREATININE 8.47*   < > 7.32* 8.04*  K 4.7   < > 4.3 4.3   < > = values in this interval not displayed.    No results for input(s): "IRON", "TIBC", "FERRITIN" in the last 168 hours. Inpatient medications:  sodium chloride   Intravenous Once   carvedilol  3.125 mg Oral BID WC   Chlorhexidine Gluconate Cloth  6 each Topical Q0600   diltiazem  360 mg Oral Daily   diphenhydrAMINE       doxercalciferol  2 mcg Intravenous Q T,Th,Sa-HD   feeding supplement  237 mL Oral Once   fluticasone  2 spray Each Nare Daily   hydrALAZINE  25 mg Oral Q6H   HYDROmorphone       multivitamin  1 tablet Oral QHS  pantoprazole (PROTONIX) IV  40 mg Intravenous Q12H   sevelamer carbonate  1,600 mg Oral TID WC    acetaminophen **OR** acetaminophen, albuterol, diphenhydrAMINE, hydrALAZINE, hydrALAZINE, HYDROmorphone, ondansetron **OR** ondansetron (ZOFRAN) IV, mouth rinse, oxyCODONE

## 2022-07-06 NOTE — Progress Notes (Signed)
[facility] Progress Note  CC: Acute on chronic anemia, melena  Subjective: He completed dialysis without any problems.  He remains on a clear liquid diet.  He is hungry and wants to eat solid food.  No bowel movement/melena or blood per the rectum since he underwent EGD and colonoscopy yesterday.  No chest pain or shortness of breath.  He refuses to wear his telemetry monitor.  Objective:   EGD 07/05/2022: - Normal esophagus. - Small hiatal hernia. - Normal stomach. - Suspected coils eroding through the duodenum at the site of prior embolization. Not actively bleeding around the coils. - Focal area of oozing proximal to the ampulla. Biopsied. Clipped to stop bleeding. Hemostatic spray applied.  Colonoscopy 07/05/2022: - Preparation of the colon was poor with stool present in the entire examined colon. - Patent end-to-end ileo-colonic anastomosis. - No obvious source of anemia identified on this study.  Vital signs in last 24 hours: Temp:  [97.5 F (36.4 C)-98.4 F (36.9 C)] 97.7 F (36.5 C) (09/21 1153) Pulse Rate:  [57-77] 77 (09/21 1125) Resp:  [8-22] 18 (09/21 1153) BP: (145-182)/(84-123) 161/102 (09/21 1153) SpO2:  [93 %-100 %] 96 % (09/21 1153) Weight:  [69.7 kg-72.3 kg] 69.7 kg (09/21 1125) Last BM Date : 07/05/22  General: 61 year old male sitting up in the chair no acute distress. Heart: Regular rate and rhythm, systolic murmur. Pulm: Breath sounds diminished throughout, no wheezes, rhonchi or crackles. Abdomen: Protuberant, nontender.  Positive bowel sounds to all 4 quadrants.  Midline abdominal scar intact. Extremities: Extremities without edema.  LUE fistula with positive bruit and thrill. Neurologic:  Alert and  oriented x 4.  Speech is clear.  Moves all extremities. Psych:  Alert and cooperative. Normal mood and affect.  Intake/Output from previous day: 09/20 0701 - 09/21 0700 In: 300 [I.V.:300] Out: 1 [Urine:1] Intake/Output this  shift: Total I/O In: -  Out: 3500 [Other:3500]  Lab Results: Recent Labs    07/04/22 1351 07/05/22 1035 07/05/22 1853 07/06/22 0738  WBC 6.9  --  6.4 5.9  HGB 9.1* 10.2* 9.2* 8.5*  HCT 27.6* 30.0* 27.6* 26.0*  PLT 243  --  237 244   BMET Recent Labs    07/04/22 0834 07/05/22 1035 07/05/22 1853 07/06/22 0738  NA 130* 129* 130* 127*  K 4.7 3.9 4.3 4.3  CL 88* 94* 90* 89*  CO2 24  --  22 23  GLUCOSE 77 77 68* 66*  BUN 71* 39* 46* 50*  CREATININE 8.47* 7.40* 7.32* 8.04*  CALCIUM 8.4*  --  8.3* 8.2*   LFT Recent Labs    07/03/22 1529 07/03/22 2039 07/05/22 1853  PROT 7.1  --   --   ALBUMIN 3.2*   < > 2.9*  AST 26  --   --   ALT 11  --   --   ALKPHOS 99  --   --   BILITOT 0.8  --   --   BILIDIR 0.3*  --   --   IBILI 0.5  --   --    < > = values in this interval not displayed.   PT/INR Recent Labs    07/03/22 1529  LABPROT 14.2  INR 1.1   Hepatitis Panel Recent Labs    07/03/22 2039  HEPBSAG NON REACTIVE  HCVAB Reactive*    CT ANGIO GI BLEED  Result Date: 07/05/2022 CLINICAL DATA:  Lower gastrointestinal bleeding EXAM: CTA ABDOMEN AND PELVIS WITHOUT AND WITH CONTRAST TECHNIQUE: Multidetector  CT imaging of the abdomen and pelvis was performed using the standard protocol during bolus administration of intravenous contrast. Multiplanar reconstructed images and MIPs were obtained and reviewed to evaluate the vascular anatomy. RADIATION DOSE REDUCTION: This exam was performed according to the departmental dose-optimization program which includes automated exposure control, adjustment of the mA and/or kV according to patient size and/or use of iterative reconstruction technique. CONTRAST:  40m OMNIPAQUE IOHEXOL 350 MG/ML SOLN COMPARISON:  02/14/2022 FINDINGS: VASCULAR Aorta: Normal caliber aorta without aneurysm, dissection, vasculitis or significant stenosis. Diffuse atherosclerosis. Celiac: Patent without evidence of aneurysm, dissection, vasculitis or  significant stenosis. Embolic coils in the distribution of the gastroduodenal artery. SMA: Patent without evidence of aneurysm, dissection, vasculitis or significant stenosis. Renals: Both renal arteries are patent. Atherosclerosis bilaterally, left greater than right. Stenosis approaching 50% within the proximal left renal artery. Less than 50% stenosis on the right. No aneurysm, dissection, or vasculitis. IMA: Patent without evidence of aneurysm, dissection, vasculitis or significant stenosis. Inflow: Patent without evidence of aneurysm, dissection, vasculitis or significant stenosis. Diffuse atherosclerosis. Proximal Outflow: Bilateral common femoral and visualized portions of the superficial and profunda femoral arteries are patent without evidence of aneurysm, dissection, vasculitis or significant stenosis. Diffuse atherosclerosis. Veins: No obvious venous abnormality within the limitations of this arterial phase study. Review of the MIP images confirms the above findings. NON-VASCULAR Lower chest: No acute pleural or parenchymal lung disease. The heart is enlarged without pericardial effusion. Hepatobiliary: Cholelithiasis without cholecystitis. The liver is unremarkable. No biliary duct dilation. Pancreas: Unremarkable. No pancreatic ductal dilatation or surrounding inflammatory changes. Spleen: 2.2 cm splenic cyst.  Spleen is not enlarged. Adrenals/Urinary Tract: Innumerable bilateral renal cysts and underlying cortical atrophy consistent with end-stage renal disease. There is a 3.9 x 3.6 cm heterogeneous lesion within the right kidney, measuring 43 HU on precontrast imaging, 40 HU on arterial phase imaging, and 47 HU on delayed imaging. While this has increased in size since 2022, the lack of significant enhancement and previous MR imaging characteristics are most consistent with proteinaceous or hemorrhagic cyst. The adrenals and bladder are grossly unremarkable. Stomach/Bowel: No bowel obstruction or  ileus. Clips are seen within the second portion duodenum from prior endoscopy. Postsurgical changes from right hemicolectomy and reanastomosis. There is minimal intraluminal contrast accumulation only seen on delayed imaging at the splenic flexure of the colon, reference image 29/15 and image 88/20, consistent with small area of acute gastrointestinal bleeding. No bowel wall thickening or inflammatory change. Lymphatic: No pathologic adenopathy within the abdomen or pelvis. Reproductive: Prostate is unremarkable. Other: Moderate ascites throughout the upper abdomen, with multiple synechiae throughout the peritoneal fluid. No free intraperitoneal gas. No abdominal wall hernia. Musculoskeletal: No acute or destructive bony lesions. Reconstructed images demonstrate no additional findings. IMPRESSION: VASCULAR 1. 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 2. No evidence of active gastrointestinal bleeding within the duodenum near the site of recent hemostatic clip placement and prior arterial embolization. 3.  Aortic Atherosclerosis (ICD10-I70.0). NON-VASCULAR 1. Moderate volume complex ascites. 2. Cholelithiasis without cholecystitis. 3. Enlarging nonenhancing hyperdense lesion right kidney, previously shown to represent a proteinaceous or hemorrhagic cyst. 4. Cardiomegaly. Critical Value/emergent results were called by telephone at the time of interpretation on 07/05/2022 at 8:47 pm [facility] verb[facility] acknowledged these results. Based on endoscopy performed earlier today, the clinical concern was for upper  gastrointestinal bleeding near the site of duodenal hemostatic clip placement. The small hemorrhage at the splenic flexure may reflect incidental hemorrhage due to angio dysplasia. Electronically Signed   By: Randa Ngo M.D.   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 S/P EGD 07/05/2022 showed suspected coils eroding through the duodenum at the site of prior embolization not actively bleeding around the coils but there was a focal area of oozing proximal to the ampulla which was biopsied and clips were placed and hemostatic spray applied. CTA was done post EGD which identified a small blush of blood in the splenic flexure which is likely due to incidental AVM without evidence of bleeding in the duodenum.  Further IR intervention was not required.  Colonoscopy resulted in a poor prep and showed a patent end-to-end ileocolonic anastomosis without identifying any obvious source to explain his anemia.  Hg 9.2 -> 8.5.  No melena/blood per the rectum post EGD and colonoscopy.  Hemodynamically stable. -Advance to full liquid diet -Continue Pantoprazole 40 mg IV twice daily -CTA if he develops risk GI bleeding -CBC in a.m. -Transfuse for hemoglobin less than 8 -Repeat colonoscopy as an outpatient with extended prep -Await further recommendations per Dr. Tarri Glenn  Acute on chronic anemia, secondary to chronic GI blood loss and ESRD  ESRD on HD, deceived dialysis earlier today  Atrial fibrillation, not on anticoagulation  HCV, eradicated.  Undetectable Hep C RNA level September 2022.  Colon cancer s/p right hemicolectomy 2014  Hyponatremia. Na+ 127. -Management per the medical service and nephrology  Principal Problem:   Acute GI bleeding Active Problems:   HTN (hypertension)   History of colon cancer   Hyponatremia   Chronic combined systolic and diastolic CHF (congestive heart failure) (HCC)   ESRD on HD TTS   Paroxysmal A-fib ([place])   historyof treated hep C    Anemia due to GI blood loss   Polysubstance abuse ([place])     LOS: 2 days    Timothy [name]  07/06/2022, 12:58 PM

## 2022-07-06 NOTE — Progress Notes (Signed)
Pt BFR lowered to 350 d/t pt refusing to keep arm straight and consistently moving

## 2022-07-06 NOTE — Progress Notes (Addendum)
[facility] PROGRESS NOTE    Progress Note  Timothy [name]  RUE:[id number] DOB: 10-27-1960 DOA: 07/03/2022 PCP: [name], MD     Brief Narrative:   Timothy [name] is an 61 y.o. male past medical history significant for end-stage renal disease on hemodialysis, treated hep C, essential hypertension, chronic diastolic heart failure, history of polysubstance abuse, thoracic aortic aneurysm, history of GI bleed with a duodenal ulcer status post IR embolization in November 16, 2021 comes in for low hemoglobin for labs drawn in dialysis.  Denies any nausea vomiting abdominal pain.  Continues to use tobacco daily denies any caffeine or NSAID use.  Significant studies: EGD history 11/05/21: EGD with large, bleeding duodenal ulcer treated with hemospray followed by GDA embolization by IR. Never took his PPI had recurrent bleeding. General surgery recommended exhausting all non surgical options as he is a poor operative risk.  11/14/21: EGD small HH, antral erythema, nonbleeding duodenal ulcer with adherent clot.  11/16/21: EGD. Clotted blood seen in gastric body. Non obstructing duodenal ulcer with spurting blood tx'd with epi, 3 endoclips, bipolar cautery without success and tx'd with hemospray 11/16/21: embolization of anterior and pancreaticoduodenal arcades.   He has continued to not take protonix or carafate and states he's never had this medication.    Assessment/Plan:   Acute GI bleeding/acute blood loss anemia Noncompliant with his PPI with melanotic stools for the past 2 weeks. Hemoglobin admission 6.6 status post 3 unit of packed red blood cells hemoglobin this morning is 8.5. Continue IV PPI twice a day. EGD showed normal esophagus suspect coiling eroding through the duodenum at the site of prior embolization no active bleeding around coil area of bruising proximal to the ampulla, he was biopsied and bleeding stopped with clipped. Colonoscopy had poor prep. GI recommended  n.p.o. for 24 hours. If no bleeding sign clear liq. Hemoglobin this morning is 8.5.  Further management per GI.  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 Appreciate nephrology's assistance.  Hyponatremia Further management per renal.  Hyperphosphatemia: Likely due to noncompliance with his diet and medication. Further management per renal.  Paroxysmal A-fib (HCC) Currently in sinus rhythm not on anticoagulation due to history of GI bleed. Continue Coreg and diltiazem, twelve-lead EKG is noted to have first-degree AV block.  Essential hypertension: Continue Coreg diltiazem and hydralazine.  Blood pressure significantly elevated use hydralazine as needed postintervention.  Chronic combined systolic and diastolic heart failure: Volume management per renal continue Coreg. 2D echo in May 2023 showed an EF of 55% with grade 2 diastolic heart failure.  Will mild tricuspid regurg.  History of colon cancer: Status post colectomy in 2014.  He was scheduled to have a colonoscopy in February 2023 but was not done due to active bleeding. For colonoscopy today.  Treated hepatitis C: Status post eradication.  Polysubstance abuse: Remote history of heroin, continues to use tobacco.    DVT prophylaxis: scd Family Communication:none Status is: Inpatient Remains inpatient appropriate because: Acute GI bleed    Code Status:     Code Status Orders  (From admission, onward)           Start     Ordered   07/03/22 1643  Full code  Continuous        07/03/22 1643  Code Status History     Date Active Date Inactive Code Status Order ID Comments User Context   04/06/2022 2236 04/10/2022 0002 Full Code [id number]  Germain Osgood, PA-C ED   03/13/2022 2131 03/15/2022 1829 Full Code  [id number]  Toy Baker, MD ED   02/13/2022 2152 02/15/2022 2056 DNR [id number]  Toy Baker, MD ED   12/30/2021 1601 01/01/2022 1726 DNR [id number]  Dorethea Clan, DO Inpatient   11/12/2021 2004 12/03/2021 0323 Full Code [id number]  Lequita Halt, MD Inpatient   11/05/2021 0250 11/09/2021 1736 Full Code [id number]  Etta Quill, DO ED   10/31/2021 2142 11/02/2021 1742 Full Code [id number]  Howerter, Ethelda Chick, DO ED   10/05/2021 1507 10/09/2021 1459 Full Code [id number]  Karmen Bongo, MD ED   06/11/2021 2307 06/25/2021 2149 Full Code [id number]  Riesa Pope, MD ED   05/20/2021 1155 05/28/2021 0433 Full Code [id number]  Jose Persia, MD ED   01/13/2018 1714 01/18/2018 1613 Full Code [id number]  Elwin Mocha, MD ED   09/09/2016 0931 09/21/2016 1437 Full Code [id number]  de Flo Shanks, MD ED   07/17/2016 1829 07/22/2016 2117 Full Code [id number]  Geradine Girt, DO Inpatient         IV Access:   Peripheral IV   Procedures and diagnostic studies:   CT ANGIO GI BLEED  Result Date: 07/05/2022 CLINICAL DATA:  Lower gastrointestinal bleeding EXAM: CTA ABDOMEN AND PELVIS WITHOUT AND WITH CONTRAST TECHNIQUE: Multidetector CT imaging of the abdomen and pelvis was performed using the standard protocol during bolus administration of intravenous contrast. Multiplanar reconstructed images and MIPs were obtained and reviewed to evaluate the vascular anatomy. RADIATION DOSE REDUCTION: This exam was performed according to the departmental dose-optimization program which includes automated exposure control, adjustment of the mA and/or kV according to patient size and/or use of iterative reconstruction technique. CONTRAST:  54m OMNIPAQUE IOHEXOL 350 MG/ML SOLN COMPARISON:  02/14/2022 FINDINGS: VASCULAR Aorta: Normal caliber aorta without aneurysm, dissection, vasculitis or significant stenosis. Diffuse atherosclerosis. Celiac: Patent without evidence of aneurysm, dissection, vasculitis or  significant stenosis. Embolic coils in the distribution of the gastroduodenal artery. SMA: Patent without evidence of aneurysm, dissection, vasculitis or significant stenosis. Renals: Both renal arteries are patent. Atherosclerosis bilaterally, left greater than right. Stenosis approaching 50% within the proximal left renal artery. Less than 50% stenosis on the right. No aneurysm, dissection, or vasculitis. IMA: Patent without evidence of aneurysm, dissection, vasculitis or significant stenosis. Inflow: Patent without evidence of aneurysm, dissection, vasculitis or significant stenosis. Diffuse atherosclerosis. Proximal Outflow: Bilateral common femoral and visualized portions of the superficial and profunda femoral arteries are patent without evidence of aneurysm, dissection, vasculitis or significant stenosis. Diffuse atherosclerosis. Veins: No obvious venous abnormality within the limitations of this arterial phase study. Review of the MIP images confirms the above findings. NON-VASCULAR Lower chest: No acute pleural or parenchymal lung disease. The heart is enlarged without pericardial effusion. Hepatobiliary: Cholelithiasis without cholecystitis. The liver is unremarkable. No biliary duct dilation. Pancreas: Unremarkable. No pancreatic ductal dilatation or surrounding inflammatory changes. Spleen: 2.2 cm splenic cyst.  Spleen is not enlarged. Adrenals/Urinary Tract: Innumerable bilateral renal cysts and underlying cortical atrophy consistent with end-stage renal disease. There is a 3.9 x 3.6 cm heterogeneous lesion within the right kidney, measuring 43 HU on precontrast imaging, 40 HU on arterial phase imaging, and 47 HU on delayed imaging. While this has increased in size since 2022, the lack of significant enhancement and previous MR imaging  characteristics are most consistent with proteinaceous or hemorrhagic cyst. The adrenals and bladder are grossly unremarkable. Stomach/Bowel: No bowel obstruction or  ileus. Clips are seen within the second portion duodenum from prior endoscopy. Postsurgical changes from right hemicolectomy and reanastomosis. There is minimal intraluminal contrast accumulation only seen on delayed imaging at the splenic flexure of the colon, reference image 29/15 and image 88/20, consistent with small area of acute gastrointestinal bleeding. No bowel wall thickening or inflammatory change. Lymphatic: No pathologic adenopathy within the abdomen or pelvis. Reproductive: Prostate is unremarkable. Other: Moderate ascites throughout the upper abdomen, with multiple synechiae throughout the peritoneal fluid. No free intraperitoneal gas. No abdominal wall hernia. Musculoskeletal: No acute or destructive bony lesions. Reconstructed images demonstrate no additional findings. IMPRESSION: VASCULAR 1. 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 2. No evidence of active gastrointestinal bleeding within the duodenum near the site of recent hemostatic clip placement and prior arterial embolization. 3.  Aortic Atherosclerosis (ICD10-I70.0). NON-VASCULAR 1. Moderate volume complex ascites. 2. Cholelithiasis without cholecystitis. 3. Enlarging nonenhancing hyperdense lesion right kidney, previously shown to represent a proteinaceous or hemorrhagic cyst. 4. Cardiomegaly. Critical Value/emergent results were called by telephone at the time of interpretation on 07/05/2022 at 8:47 pm to provider [facility], who verbally acknowledged these results. Based on endoscopy performed earlier today, the clinical concern was for upper gastrointestinal bleeding near the site of duodenal hemostatic clip placement. The small hemorrhage at the splenic flexure may reflect incidental hemorrhage due to angio dysplasia. Electronically Signed   By: Randa Ngo M.D.   On: 07/05/2022 20:57     Medical Consultants:   None.   Subjective:    Timothy [name] complaining that he is hungry wants to eat.  Objective:    Vitals:   07/06/22 0930 07/06/22 1000 07/06/22 1030 07/06/22 1100  BP: (!) 162/88 (!) 168/91 (!) 180/94 (!) 177/98  Pulse: 72 71 72 74  Resp: '16 13 17 15  '$ Temp:      TempSrc:      SpO2: 95% 94% 93% 96%  Weight:      Height:       SpO2: 96 % O2 Flow Rate (L/min): 4 L/min   Intake/Output Summary (Last 24 hours) at 07/06/2022 1116 Last data filed at 07/05/2022 1137 Gross per 24 hour  Intake --  Output 0 ml  Net 0 ml    Filed Weights   07/04/22 0814 07/04/22 1200 07/06/22 0813  Weight: 77.9 kg 74.9 kg 72.3 kg    Exam: General exam: In no acute distress. Respiratory system: Good air movement and clear to auscultation. Cardiovascular system: S1 & S2 heard, RRR. No JVD. Gastrointestinal system: Abdomen is nondistended, soft and nontender.  Extremities: No pedal edema. Skin: No rashes, lesions or ulcers Psychiatry: Judgement and insight appear normal. Mood & affect appropriate.   Data Reviewed:    Labs: Basic Metabolic Panel: Recent Labs  Lab 07/03/22 1257 07/03/22 2039 07/04/22 0834 07/05/22 1035 07/05/22 1853 07/06/22 0738  NA 128*  --  130* 129* 130* 127*  K 4.6  --  4.7 3.9 4.3 4.3  CL 87*  --  88* 94* 90* 89*  CO2 26  --  24  --  22 23  GLUCOSE 90  --  77 77 68* 66*  BUN 57*  --  71* 39* 46* 50*  CREATININE  7.66*  --  8.47* 7.40* 7.32* 8.04*  CALCIUM 8.1*  --  8.4*  --  8.3* 8.2*  MG  --   --   --   --  2.2  --   PHOS  --  6.1* 6.8*  --  6.2*  --     GFR Estimated Creatinine Clearance: 9.9 mL/min (A) (by C-G formula based on SCr of 8.04 mg/dL (H)). Liver Function Tests: Recent Labs  Lab 07/03/22 1529 07/03/22 2039 07/04/22 0834 07/05/22 1853  AST 26  --   --   --   ALT 11  --   --   --   ALKPHOS 99  --   --   --   BILITOT 0.8  --   --   --   PROT 7.1  --   --   --   ALBUMIN  3.2* 3.0* 3.0* 2.9*    No results for input(s): "LIPASE", "AMYLASE" in the last 168 hours. No results for input(s): "AMMONIA" in the last 168 hours. Coagulation profile Recent Labs  Lab 07/03/22 1529  INR 1.1    COVID-19 Labs  No results for input(s): "DDIMER", "FERRITIN", "LDH", "CRP" in the last 72 hours.  Lab Results  Component Value Date   SARSCOV2NAA NEGATIVE 04/06/2022   [place] NEGATIVE 12/30/2021   [place] NEGATIVE 11/12/2021   [place] NEGATIVE 11/04/2021    CBC: Recent Labs  Lab 07/03/22 1257 07/03/22 2039 07/04/22 0834 07/04/22 1351 07/05/22 1035 07/05/22 1853 07/06/22 0738  WBC 5.4  --  6.7 6.9  --  6.4 5.9  NEUTROABS 3.5  --   --  5.7  --   --   --   HGB 6.6*   < > 6.7* 9.1* 10.2* 9.2* 8.5*  HCT 21.0*   < > 20.7* 27.6* 30.0* 27.6* 26.0*  MCV 98.1  --  92.4 89.3  --  89.3 90.0  PLT 259  --  243 243  --  237 244   < > = values in this interval not displayed.    Cardiac Enzymes: No results for input(s): "CKTOTAL", "CKMB", "CKMBINDEX", "TROPONINI" in the last 168 hours. BNP (last 3 results) No results for input(s): "PROBNP" in the last 8760 hours. CBG: No results for input(s): "GLUCAP" in the last 168 hours. D-Dimer: No results for input(s): "DDIMER" in the last 72 hours. Hgb A1c: No results for input(s): "HGBA1C" in the last 72 hours. Lipid Profile: No results for input(s): "CHOL", "HDL", "LDLCALC", "TRIG", "CHOLHDL", "LDLDIRECT" in the last 72 hours. Thyroid function studies: No results for input(s): "TSH", "T4TOTAL", "T3FREE", "THYROIDAB" in the last 72 hours.  Invalid input(s): "FREET3" Anemia work up: No results for input(s): "VITAMINB12", "FOLATE", "FERRITIN", "TIBC", "IRON", "RETICCTPCT" in the last 72 hours. Sepsis Labs: Recent Labs  Lab 07/04/22 0834 07/04/22 1351 07/05/22 1853 07/06/22 0738  WBC 6.7 6.9 6.4 5.9    Microbiology Recent Results (from the past 240 hour(s))  MRSA Next Gen by PCR, Nasal     Status: None    Collection Time: 07/03/22  7:38 PM   Specimen: Nasal Mucosa; Nasal Swab  Result Value Ref Range Status   MRSA by PCR Next Gen NOT DETECTED NOT DETECTED Final    Comment: (NOTE) The GeneXpert MRSA Assay (FDA approved for NASAL specimens only), is one component of a comprehensive MRSA colonization surveillance program. It is not intended to diagnose MRSA infection nor to guide or monitor treatment for MRSA infections. Test performance is not FDA approved in patients less  than 36 years old. Performed at [facility], [address], [place], [place] [address]      Medications:    sodium chloride   Intravenous Once   carvedilol  3.125 mg Oral BID WC   Chlorhexidine Gluconate Cloth  6 each Topical Q0600   diltiazem  360 mg Oral Daily   diphenhydrAMINE       doxercalciferol  2 mcg Intravenous Q T,Th,Sa-HD   feeding supplement  237 mL Oral Once   fluticasone  2 spray Each Nare Daily   hydrALAZINE  25 mg Oral Q6H   HYDROmorphone       multivitamin  1 tablet Oral QHS   pantoprazole (PROTONIX) IV  40 mg Intravenous Q12H   sevelamer carbonate  1,600 mg Oral TID WC   Continuous Infusions:    LOS: 2 days   Charlynne Cousins  [facility]  07/06/2022, 11:16 AM

## 2022-07-06 NOTE — Progress Notes (Signed)
Pt completed HD tx, ran the entire 3.75 hours. Uf removed 3500 ml and processed 84.6 L of blood. No complications with accessing or deaccessing his fistula.

## 2022-07-06 NOTE — Progress Notes (Signed)
Patient refusing telemetry for this shift. MD Olevia Bowens aware.

## 2022-07-06 NOTE — TOC Progression Note (Signed)
Transition of Care [facility]) - Progression Note    Patient Details  Name: Truth Barot MRN: [id number] Date of Birth: 1961/07/26  Transition of Care [facility]) CM/SW [place], RN Phone Number:[phone]  07/06/2022, 4:04 PM  Clinical Narrative:     Transition of Care [facility]) Screening Note   Patient Details  Name: Ad Guttman Date of Birth: 12-May-1961   Transition of Care [facility]) CM/SW Contact:    Angelita Ingles, RN Phone Number: 07/06/2022, 4:04 PM    Transition of Care Department [facility]) has reviewed patient and no TOC needs have been identified at this time. We will continue to monitor patient advancement through interdisciplinary progression rounds. If new patient transition needs arise, please place a TOC consult.          Expected Discharge Plan and Services                                                 Social Determinants of Health (SDOH) Interventions    Readmission Risk Interventions    02/15/2022    2:57 PM 11/16/2021    2:19 PM 11/09/2021    9:47 AM  Readmission Risk Prevention Plan  Transportation Screening Complete  Complete  Medication Review (RN Care Manager) Complete Referral to Pharmacy Referral to Pharmacy  PCP or Specialist appointment within 3-5 days of discharge Complete Not Complete Complete  PCP/Specialist Appt Not Complete comments  patient not medically ready to d/c   HRI or Home Care Consult Complete Not Complete Patient refused  [place] or Home Care Consult Pt Refusal Comments  not complete, waiting on PT recs   SW Recovery Care/Counseling Consult Complete Complete Complete  Palliative Care Screening Not Applicable Not Applicable Not [place] Not Applicable Not Complete Not Applicable  SNF Comments  Pending PT recommendations (CSW following)

## 2022-07-07 DIAGNOSIS — D500 Iron deficiency anemia secondary to blood loss (chronic): Secondary | ICD-10-CM

## 2022-07-07 DIAGNOSIS — I5042 Chronic combined systolic (congestive) and diastolic (congestive) heart failure: Secondary | ICD-10-CM | POA: Diagnosis not present

## 2022-07-07 DIAGNOSIS — K922 Gastrointestinal hemorrhage, unspecified: Secondary | ICD-10-CM | POA: Diagnosis not present

## 2022-07-07 DIAGNOSIS — N186 End stage renal disease: Secondary | ICD-10-CM | POA: Diagnosis not present

## 2022-07-07 MED ORDER — PANTOPRAZOLE SODIUM 40 MG PO TBEC
40.0000 mg | DELAYED_RELEASE_TABLET | Freq: Two times a day (BID) | ORAL | Status: DC
  Administered 2022-07-07 – 2022-07-12 (×10): 40 mg via ORAL
  Filled 2022-07-07 (×11): qty 1

## 2022-07-07 MED ORDER — CHLORHEXIDINE GLUCONATE CLOTH 2 % EX PADS: 6.0000 | MEDICATED_PAD | Freq: Every day | CUTANEOUS | Status: DC

## 2022-07-07 NOTE — Progress Notes (Addendum)
[facility]  PROGRESS NOTE  Timothy [name] OBS:[id number] DOB: 09/14/1961 DOA: 07/03/2022 PCP: [name], MD   Brief HPI:   61 year old male with history of ESRD on HD, hepatitis C, hypertension, diastolic heart failure, polysubstance abuse, thoracic aortic aneurysm, GI bleed with duodenal ulcer s/p IR embolization in February 2023 came with low hemoglobin from labs drawn in dialysis.  Significant studies: EGD history 11/05/21: EGD with large, bleeding duodenal ulcer treated with hemospray followed by GDA embolization by IR. Never took his PPI had recurrent bleeding. General surgery recommended exhausting all non surgical options as he is a poor operative risk.  11/14/21: EGD small HH, antral erythema, nonbleeding duodenal ulcer with adherent clot.  11/16/21: EGD. Clotted blood seen in gastric body. Non obstructing duodenal ulcer with spurting blood tx'd with epi, 3 endoclips, bipolar cautery without success and tx'd with hemospray 11/16/21: embolization of anterior and pancreaticoduodenal arcades.   He has continued to not take protonix or carafate and states he's never had this medication.    Subjective   Patient seen and examined, denies any further bleeding.  States that he is constipated, he has not moved his bowels.  Denies nausea vomiting.  Tolerating clear liquid diet and wants to advance the diet.Denies itching.   Assessment/Plan:    Acute GI bleed -Patient has been noncompliant with his PPI with no noted stools for past 2 weeks -Hemoglobin was 6.6 on admission, s/p 3 units PRBC hemoglobin has improved to 8.5 -IV PPI twice a day  EGD showed normal esophagus suspect coiling eroding through the duodenum at the site of prior embolization no active bleeding around coil area of bruising proximal to the ampulla, he was biopsied and bleeding stopped with clipped. Colonoscopy had poor prep. Hemoglobin was 8.5 yesterday morning, will check CBC today -We will advance diet to  soft diet and monitor -GI recommended PPI twice daily for 10 weeks then reduce to every morning -CTA with significant overt GI bleed -Outpatient colonoscopy with 2-day bowel prep  ESRD -Nephrology following - s/p Revision of left arm AV fistula with pseudoaneurysm plication and branch ligation on 05/26/22 by Dr. Donzetta Matters.   Hyponatremia -Management per nephrology  Paroxysmal atrial fibrillation -Patient is in sinus rhythm, not on anticoagulation due to GI bleed -Continue Coreg, diltiazem  Hypertension -Continue Coreg, diltiazem, hydralazine -Poor control of blood pressure -Continue as needed hydralazine for SBP greater than 180 or DBP greater than 110  History of colon cancer -S/p colectomy in 2014 -Colonoscopy done in the hospital which had poor bowel prep    Medications     sodium chloride   Intravenous Once   carvedilol  3.125 mg Oral BID WC   Chlorhexidine Gluconate Cloth  6 each Topical Q0600   diltiazem  360 mg Oral Daily   doxercalciferol  2 mcg Intravenous Q T,Th,Sa-HD   feeding supplement  237 mL Oral Once   fluticasone  2 spray Each Nare Daily   hydrALAZINE  25 mg Oral Q6H   multivitamin  1 tablet Oral QHS   pantoprazole (PROTONIX) IV  40 mg Intravenous Q12H   sevelamer carbonate  1,600 mg Oral TID WC     Data Reviewed:   CBG:  No results for input(s): "GLUCAP" in the last 168 hours.  SpO2: 98 % O2 Flow Rate (L/min): 4 L/min    Vitals:   07/06/22 1600 07/06/22 1943 07/06/22 2322 07/07/22 0500  BP: (!) 174/92 (!) 180/104 (!) 184/102 (!) 175/98  Pulse: 76 80    Resp:  20    Temp: 98.1 F (36.7 C) 97.7 F (36.5 C) 98 F (36.7 C)   TempSrc: Oral Oral Oral   SpO2: 96% 98%    Weight:      Height:          Data Reviewed:  Basic Metabolic Panel: Recent Labs  Lab 07/03/22 1257 07/03/22 2039 07/04/22 0834 07/05/22 1035 07/05/22 1853 07/06/22 0738  NA 128*  --  130* 129* 130* 127*  K 4.6  --  4.7 3.9 4.3 4.3  CL 87*  --  88* 94* 90* 89*   CO2 26  --  24  --  22 23  GLUCOSE 90  --  77 77 68* 66*  BUN 57*  --  71* 39* 46* 50*  CREATININE 7.66*  --  8.47* 7.40* 7.32* 8.04*  CALCIUM 8.1*  --  8.4*  --  8.3* 8.2*  MG  --   --   --   --  2.2  --   PHOS  --  6.1* 6.8*  --  6.2*  --     CBC: Recent Labs  Lab 07/03/22 1257 07/03/22 2039 07/04/22 0834 07/04/22 1351 07/05/22 1035 07/05/22 1853 07/06/22 0738  WBC 5.4  --  6.7 6.9  --  6.4 5.9  NEUTROABS 3.5  --   --  5.7  --   --   --   HGB 6.6*   < > 6.7* 9.1* 10.2* 9.2* 8.5*  HCT 21.0*   < > 20.7* 27.6* 30.0* 27.6* 26.0*  MCV 98.1  --  92.4 89.3  --  89.3 90.0  PLT 259  --  243 243  --  237 244   < > = values in this interval not displayed.    LFT Recent Labs  Lab 07/03/22 1529 07/03/22 2039 07/04/22 0834 07/05/22 1853  AST 26  --   --   --   ALT 11  --   --   --   ALKPHOS 99  --   --   --   BILITOT 0.8  --   --   --   PROT 7.1  --   --   --   ALBUMIN 3.2* 3.0* 3.0* 2.9*     Antibiotics: Anti-infectives (From admission, onward)    None        DVT prophylaxis: SCDs  Code Status: Full code  Family Communication: No family at bedside   CONSULTS gastroenterology   Objective    Physical Examination:  General-appears in no acute distress Heart-S1-S2, regular, no murmur auscultated Lungs-clear to auscultation bilaterally, no wheezing or crackles auscultated Abdomen-soft, nontender, no organomegaly Extremities-no edema in the lower extremities Neuro-alert, oriented x3, no focal deficit noted  Status is: Inpatient:             Timothy [name]   [facility] If 7PM-7AM, please contact night-coverage at [email or web address], Office  [phone]   07/07/2022, 6:59 AM  LOS: 3 days

## 2022-07-07 NOTE — Progress Notes (Signed)
Notified Hospitalist G. Shalhoub MD that patient is refusing his lab work this morning. CBC and BMP were ordered for today. Next HD day is Saturday and they can get lab work then.

## 2022-07-07 NOTE — Progress Notes (Signed)
Pt refusing to be on cardiac telemetry monitoring this am and refusing Carvedilol, saying that it does not work and he won't take it anymore.  1800 Pt consistently refused anti-hypertensives today

## 2022-07-07 NOTE — Progress Notes (Signed)
PHARMACIST - PHYSICIAN COMMUNICATION  DR:   Darrick Meigs  CONCERNING: IV to Oral Route Change Policy  RECOMMENDATION: This patient is receiving protonix by the intravenous route.  Based on criteria approved by the Pharmacy and Therapeutics Committee, the intravenous medication(s) is/are being converted to the equivalent oral dose form(s).  Pt is taking meds PO  DESCRIPTION: These criteria include: The patient is eating (either orally or via tube) and/or has been taking other orally administered medications for a least 24 hours The patient has no evidence of active gastrointestinal bleeding or impaired GI absorption (gastrectomy, short bowel, patient on TNA or NPO).  If you have questions about this conversion, please contact the Pharmacy Department  '[]'$   [phone] )  [facility] '[]'$   [phone] )  [facility] '[x]'$   [phone] )  [facility] '[]'$   [phone] )  [facility] '[]'$   [phone] )  [facility] [place]

## 2022-07-07 NOTE — Progress Notes (Signed)
[facility] Progress Note   Subjective:   Pt seen in room. Only complaint today is burning pain in his feet when he walks. Says this has been happening since his last hospitalization a few months ago. Denies any known history of neuropathy. Denies SOB, CP, dizziness and nausea.   Objective Vitals:   07/06/22 1943 07/06/22 2322 07/07/22 0500 07/07/22 0750  BP: (!) 180/104 (!) 184/102 (!) 175/98 (!) 180/108  Pulse: 80   77  Resp: 20   18  Temp: 97.7 F (36.5 C) 98 F (36.7 C)  99 F (37.2 C)  TempSrc: Oral Oral  Oral  SpO2: 98%   98%  Weight:      Height:       Physical Exam General: Alert male in NAD Heart: RRR, no murmurs, rubs or gallops Lungs: CTA bilaterally without wheezing,rhonchi or rales Abdomen: Soft, non-distended, +BS Extremities: No edema b/l lower extremities Dialysis Access:  LUE AVF + bruit  Additional Objective Labs: Basic Metabolic Panel: Recent Labs  Lab 07/03/22 2039 07/04/22 0834 07/05/22 1035 07/05/22 1853 07/06/22 0738  NA  --  130* 129* 130* 127*  K  --  4.7 3.9 4.3 4.3  CL  --  88* 94* 90* 89*  CO2  --  24  --  22 23  GLUCOSE  --  77 77 68* 66*  BUN  --  71* 39* 46* 50*  CREATININE  --  8.47* 7.40* 7.32* 8.04*  CALCIUM  --  8.4*  --  8.3* 8.2*  PHOS 6.1* 6.8*  --  6.2*  --    Liver Function Tests: Recent Labs  Lab 07/03/22 1529 07/03/22 2039 07/04/22 0834 07/05/22 1853  AST 26  --   --   --   ALT 11  --   --   --   ALKPHOS 99  --   --   --   BILITOT 0.8  --   --   --   PROT 7.1  --   --   --   ALBUMIN 3.2* 3.0* 3.0* 2.9*   No results for input(s): "LIPASE", "AMYLASE" in the last 168 hours. CBC: Recent Labs  Lab 07/03/22 1257 07/03/22 2039 07/04/22 0834 07/04/22 1351 07/05/22 1035 07/05/22 1853 07/06/22 0738  WBC 5.4  --  6.7 6.9  --  6.4 5.9  NEUTROABS 3.5  --   --  5.7  --   --   --   HGB 6.6*   < > 6.7* 9.1* 10.2* 9.2* 8.5*  HCT 21.0*   < > 20.7* 27.6* 30.0* 27.6* 26.0*  MCV 98.1  --  92.4 89.3  --   89.3 90.0  PLT 259  --  243 243  --  237 244   < > = values in this interval not displayed.   Blood Culture    Component Value Date/Time   SDES PERITONEAL FLUID 06/22/2021 1236   SPECREQUEST ABDOMEN 06/22/2021 1236   CULT  06/22/2021 1236    NO GROWTH 3 DAYS Performed at [facility], [place] [address], [place], [place] [address]    REPTSTATUS 06/26/2021 FINAL 06/22/2021 1236    Cardiac Enzymes: No results for input(s): "CKTOTAL", "CKMB", "CKMBINDEX", "TROPONINI" in the last 168 hours. CBG: No results for input(s): "GLUCAP" in the last 168 hours. Iron Studies: No results for input(s): "IRON", "TIBC", "TRANSFERRIN", "FERRITIN" in the last 72 hours. '@lablastinr3'$ @ Studies/Results: CT ANGIO GI BLEED  Result Date: 07/05/2022 CLINICAL DATA:  Lower gastrointestinal bleeding  EXAM: CTA ABDOMEN AND PELVIS WITHOUT AND WITH CONTRAST TECHNIQUE: Multidetector CT imaging of the abdomen and pelvis was performed using the standard protocol during bolus administration of intravenous contrast. Multiplanar reconstructed images and MIPs were obtained and reviewed to evaluate the vascular anatomy. RADIATION DOSE REDUCTION: This exam was performed according to the departmental dose-optimization program which includes automated exposure control, adjustment of the mA and/or kV according to patient size and/or use of iterative reconstruction technique. CONTRAST:  59m OMNIPAQUE IOHEXOL 350 MG/ML SOLN COMPARISON:  02/14/2022 FINDINGS: VASCULAR Aorta: Normal caliber aorta without aneurysm, dissection, vasculitis or significant stenosis. Diffuse atherosclerosis. Celiac: Patent without evidence of aneurysm, dissection, vasculitis or significant stenosis. Embolic coils in the distribution of the gastroduodenal artery. SMA: Patent without evidence of aneurysm, dissection, vasculitis or significant stenosis. Renals: Both renal arteries are patent. Atherosclerosis bilaterally, left greater than right. Stenosis approaching  50% within the proximal left renal artery. Less than 50% stenosis on the right. No aneurysm, dissection, or vasculitis. IMA: Patent without evidence of aneurysm, dissection, vasculitis or significant stenosis. Inflow: Patent without evidence of aneurysm, dissection, vasculitis or significant stenosis. Diffuse atherosclerosis. Proximal Outflow: Bilateral common femoral and visualized portions of the superficial and profunda femoral arteries are patent without evidence of aneurysm, dissection, vasculitis or significant stenosis. Diffuse atherosclerosis. Veins: No obvious venous abnormality within the limitations of this arterial phase study. Review of the MIP images confirms the above findings. NON-VASCULAR Lower chest: No acute pleural or parenchymal lung disease. The heart is enlarged without pericardial effusion. Hepatobiliary: Cholelithiasis without cholecystitis. The liver is unremarkable. No biliary duct dilation. Pancreas: Unremarkable. No pancreatic ductal dilatation or surrounding inflammatory changes. Spleen: 2.2 cm splenic cyst.  Spleen is not enlarged. Adrenals/Urinary Tract: Innumerable bilateral renal cysts and underlying cortical atrophy consistent with end-stage renal disease. There is a 3.9 x 3.6 cm heterogeneous lesion within the right kidney, measuring 43 HU on precontrast imaging, 40 HU on arterial phase imaging, and 47 HU on delayed imaging. While this has increased in size since 2022, the lack of significant enhancement and previous MR imaging characteristics are most consistent with proteinaceous or hemorrhagic cyst. The adrenals and bladder are grossly unremarkable. Stomach/Bowel: No bowel obstruction or ileus. Clips are seen within the second portion duodenum from prior endoscopy. Postsurgical changes from right hemicolectomy and reanastomosis. There is minimal intraluminal contrast accumulation only seen on delayed imaging at the splenic flexure of the colon, reference image 29/15 and image  88/20, consistent with small area of acute gastrointestinal bleeding. No bowel wall thickening or inflammatory change. Lymphatic: No pathologic adenopathy within the abdomen or pelvis. Reproductive: Prostate is unremarkable. Other: Moderate ascites throughout the upper abdomen, with multiple synechiae throughout the peritoneal fluid. No free intraperitoneal gas. No abdominal wall hernia. Musculoskeletal: No acute or destructive bony lesions. Reconstructed images demonstrate no additional findings. IMPRESSION: VASCULAR 1. 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 2. No evidence of active gastrointestinal bleeding within the duodenum near the site of recent hemostatic clip placement and prior arterial embolization. 3.  Aortic Atherosclerosis (ICD10-I70.0). NON-VASCULAR 1. Moderate volume complex ascites. 2. Cholelithiasis without cholecystitis. 3. Enlarging nonenhancing hyperdense lesion right kidney, previously shown to represent a proteinaceous or hemorrhagic cyst. 4. Cardiomegaly. Critical Value/emergent results were called by telephone at the time of interpretation on 07/05/2022 at 8:47 pm [facility] verbally acknowledged these results. Based  on endoscopy performed earlier today, the clinical concern was for upper gastrointestinal bleeding near the site of duodenal hemostatic clip placement. The small hemorrhage at the splenic flexure may reflect incidental hemorrhage due to angio dysplasia. Electronically Signed   By: Randa Ngo M.D.   On: 07/05/2022 20:57   Medications:   sodium chloride   Intravenous Once   carvedilol  3.125 mg Oral BID WC   Chlorhexidine Gluconate Cloth  6 each Topical Q0600   diltiazem  360 mg Oral Daily   doxercalciferol  2 mcg Intravenous Q T,Th,Sa-HD   feeding supplement  237 mL Oral  Once   fluticasone  2 spray Each Nare Daily   hydrALAZINE  25 mg Oral Q6H   multivitamin  1 tablet Oral QHS   pantoprazole (PROTONIX) IV  40 mg Intravenous Q12H   sevelamer carbonate  1,600 mg Oral TID WC    OP HD: [facility]  3h 63mn  67kg  2/2 bath  450/1.5   P4  Hep none    LUA AVF (using upper aspects only per pt, recovering from surgery) - hectorol 2 ug iV tiw - venofer '50mg'$  weekly (just completed IV fe load on 9/12) - mircera 225 q2, last 9/12, due 9/26    Assessment/Plan: Recurrent GI bleed - Hb 6.6 on admit, up to 9 today. EGD showed probable embolization coils eroding thru the duodenum but not bleeding. Some oozing prox to ampulla was biopsied and clipped. On IV PPI.  had multiple EGD's and interventions including two upper GI embolizations by IR in Jan and Feb 2023. Management per GI ESRD - on HD TTS.  Next HD Saturday HTN/ vol - Mildly hyponatremic. Above his outpatient EDW, may have some volume excess from liquid diet and blood transfusions. Tolerated 3.5L UF yesterday Anemia esrd + abl - S/p 3 units PRBC, Hgb now stable. Esa not due until 9/26.  MBD ckd - CCa in  range, phos a bit high. Cont IV vdra w HD, and renvela as binder ac tid.  Hx HCV - eradicated, undetectable virus Sept 2022.     SAnice Paganini PA-C 07/07/2022, 10:29 AM  [facility] Pager: [phone]

## 2022-07-07 NOTE — Care Management Important Message (Signed)
Important Message  Patient Details  Name: Nazier Neyhart MRN: [id number] Date of Birth: [birth year]   Medicare Important Message Given:  Yes     Shelda Altes 07/07/2022, 2:08 PM

## 2022-07-07 NOTE — Progress Notes (Signed)
[facility] Progress Note  CC: Acute on chronic anemia, melena  Subjective: Hungry. Wants to eat. Has not had a bowel movement since endoscopy. No new GI complaints today. Upset after interaction with the phlebotomist this morning.   Objective:   EGD 07/05/2022: - Small hiatal hernia. - Suspected coils eroding through the duodenum at the site of prior embolization. Not actively bleeding around the coils. - Focal area of oozing proximal to the ampulla. Biopsied. Clipped to stop bleeding. Hemostatic spray applied.  A. PERI AMPULLAR BIOPSY:  Acute periampullary duodenitis with foveolar hyperplasia and reactive  atypia compatible with peptic duodenitis  Negative for dysplasia and carcinoma   Colonoscopy 07/05/2022: - Preparation of the colon was poor with stool present in the entire examined colon. - Patent end-to-end ileo-colonic anastomosis. - No obvious source of anemia identified on this study.  Vital signs in last 24 hours: Temp:  [97.7 F (36.5 C)-99 F (37.2 C)] 99 F (37.2 C) (09/22 0750) Pulse Rate:  [71-80] 77 (09/22 0750) Resp:  [13-20] 18 (09/22 0750) BP: (161-184)/(91-108) 180/108 (09/22 0750) SpO2:  [93 %-98 %] 98 % (09/22 0750) Weight:  [69.7 kg] 69.7 kg (09/21 1125) Last BM Date : 07/06/22  General: 61 year old male lying in the hospital bed in no acute distress. Abdomen: Protuberant, nontender.  Positive bowel sounds to all 4 quadrants.  Midline abdominal scar intact. Extremities: Extremities without edema.  LUE fistula with positive bruit and thrill. Neurologic:  Alert and  oriented x 4.  Speech is clear.  Moves all extremities. Psych:  Alert and cooperative. Normal mood and affect.   Lab Results: Recent Labs    07/04/22 1351 07/05/22 1035 07/05/22 1853 07/06/22 0738  WBC 6.9  --  6.4 5.9  HGB 9.1* 10.2* 9.2* 8.5*  HCT 27.6* 30.0* 27.6* 26.0*  PLT 243  --  237 244   BMET Recent Labs    07/05/22 1035 07/05/22 1853  07/06/22 0738  NA 129* 130* 127*  K 3.9 4.3 4.3  CL 94* 90* 89*  CO2  --  22 23  GLUCOSE 77 68* 66*  BUN 39* 46* 50*  CREATININE 7.40* 7.32* 8.04*  CALCIUM  --  8.3* 8.2*   LFT Recent Labs    07/05/22 1853  ALBUMIN 2.9*    Assessment / Plan:  61 year old male with recurrent upper GI bleed status post embolization of GDA 11/05/2021 and embolization anterior and pancreaticoduodenal arcades 11/16/2021. EGD 07/05/2022 showed suspected coils eroding through the duodenum at the site of prior embolization not actively bleeding around the coils but there was a focal area of oozing proximal to the ampulla which was biopsied and clips were placed and hemostatic spray applied. Biopsies were consistent with peptic duodenitis. He had not been on PPI therapy following his embolization earlier this year. CTA was done post EGD which identified a small blush of blood in the splenic flexure which is likely due to incidental AVM without evidence of bleeding in the duodenum.  Further IR intervention was not required.  Colonoscopy resulted in a poor prep and showed a patent end-to-end ileocolonic anastomosis without identifying any obvious source to explain his anemia. No melena/blood following endoscopic evaluation. No labs this morning.  Hemodynamically stable. He has received a total of 3 units of PRBCs. -Diet as tolerated -Continue Pantoprazole 40 mg BID x 10 weeks, then 40 mg QAM indefinitely -Follow serial hgb/hct with transfusion as indicated -CTA if he develops risk GI bleeding -Repeat colonoscopy as an outpatient  with extended prep  Acute on chronic anemia, secondary to chronic GI blood loss and ESRD  ESRD on HD  Atrial fibrillation, not on anticoagulation  HCV, eradicated.  Undetectable Hep C RNA level September 2022.  Colon cancer s/p right hemicolectomy 2014. Incomplete colonoscopy 9/23 due to poor prep. Repeat colonoscopy with extended prep recommended as an outpatient.   The inpatient GI  service will move to stand-by. Please call the on-call gastroenterologist with any additional overt bleeding, or with any questions or concerns.     LOS: 3 days   Thornton Park  07/07/2022, 9:34 AM

## 2022-07-08 DIAGNOSIS — K922 Gastrointestinal hemorrhage, unspecified: Secondary | ICD-10-CM | POA: Diagnosis not present

## 2022-07-08 LAB — RENAL FUNCTION PANEL
Albumin: 3 g/dL — ABNORMAL LOW (ref 3.5–5.0)
Anion gap: 11 (ref 5–15)
BUN: 43 mg/dL — ABNORMAL HIGH (ref 8–23)
CO2: 23 mmol/L (ref 22–32)
Calcium: 7.8 mg/dL — ABNORMAL LOW (ref 8.9–10.3)
Chloride: 91 mmol/L — ABNORMAL LOW (ref 98–111)
Creatinine, Ser: 7.75 mg/dL — ABNORMAL HIGH (ref 0.61–1.24)
GFR, Estimated: 7 mL/min — ABNORMAL LOW (ref >60)
Glucose, Bld: 161 mg/dL — ABNORMAL HIGH (ref 70–99)
Phosphorus: 5.2 mg/dL — ABNORMAL HIGH (ref 2.5–4.6)
Potassium: 4.8 mmol/L (ref 3.5–5.1)
Sodium: 125 mmol/L — ABNORMAL LOW (ref 135–145)

## 2022-07-08 LAB — CBC
HCT: 24.4 % — ABNORMAL LOW (ref 39.0–52.0)
Hemoglobin: 8 g/dL — ABNORMAL LOW (ref 13.0–17.0)
MCH: 29.6 pg (ref 26.0–34.0)
MCHC: 32.8 g/dL (ref 30.0–36.0)
MCV: 90.4 fL (ref 80.0–100.0)
Platelets: 225 10*3/uL (ref 150–400)
RBC: 2.7 MIL/uL — ABNORMAL LOW (ref 4.22–5.81)
RDW: 18.7 % — ABNORMAL HIGH (ref 11.5–15.5)
WBC: 5.6 10*3/uL (ref 4.0–10.5)
nRBC: 0 % (ref 0.0–0.2)

## 2022-07-08 LAB — HEMOGLOBIN AND HEMATOCRIT, BLOOD
HCT: 25.3 % — ABNORMAL LOW (ref 39.0–52.0)
Hemoglobin: 8.4 g/dL — ABNORMAL LOW (ref 13.0–17.0)

## 2022-07-08 MED ORDER — DIPHENHYDRAMINE HCL 50 MG/ML IJ SOLN
25.0000 mg | Freq: Once | INTRAMUSCULAR | Status: AC
  Administered 2022-07-08: 25 mg via INTRAVENOUS

## 2022-07-08 MED ORDER — DIPHENHYDRAMINE HCL 50 MG/ML IJ SOLN
INTRAMUSCULAR | Status: AC
  Filled 2022-07-08: qty 1

## 2022-07-08 NOTE — Progress Notes (Signed)
Received patient in bed to unit.  Alert and oriented.  Informed consent signed and in chart.   Treatment initiated: 9480 Treatment completed: 1157  Patient tolerated well.  Transported back to the room  Alert, without acute distress.  Hand-off given to patient's nurse. Meliton Rattan RN  Access used: AVF  Access issues: none  Total UF removed: 3500 ml Medication(s) given: hectorol and benadryl Post HD VS: 97.5, 76, 22, 188/99, 96% RA  Post HD weight: 72.3 kg   Dressing c/d/I. Patient report chronic pain in back.   [facility]

## 2022-07-08 NOTE — Progress Notes (Signed)
PROGRESS NOTE    Timothy [name]  HBZ:[id number] DOB: 05/24/1961 DOA: 07/03/2022 PCP: [name], MD  Chief Complaint  Patient presents with   Abnormal Lab    Brief Narrative:  61 year old male with history of ESRD on HD, hepatitis C, hypertension, diastolic heart failure, polysubstance abuse, thoracic aortic aneurysm, GI bleed with duodenal ulcer s/p IR embolization in February 2023 came with low hemoglobin from labs drawn in dialysis.   Significant studies: EGD history 11/05/21: EGD with large, bleeding duodenal ulcer treated with hemospray followed by GDA embolization by IR. Never took his PPI had recurrent bleeding. General surgery recommended exhausting all non surgical options as he is Tahjai Schetter poor operative risk.  11/14/21: EGD small HH, antral erythema, nonbleeding duodenal ulcer with adherent clot.  11/16/21: EGD. Clotted blood seen in gastric body. Non obstructing duodenal ulcer with spurting blood tx'd with epi, 3 endoclips, bipolar cautery without success and tx'd with hemospray 11/16/21: embolization of anterior and pancreaticoduodenal arcades.   He has continued to not take protonix or carafate and states he's never had this medication.   Assessment & Plan:   Principal Problem:   Acute GI bleeding Active Problems:   ESRD on HD TTS   Hyponatremia   Paroxysmal Teala Daffron-fib (HCC)   HTN (hypertension)   Chronic combined systolic and diastolic CHF (congestive heart failure) (HCC)   History of colon cancer   historyof treated hep C    Polysubstance abuse ([place])   Anemia due to GI blood loss   Assessment and Plan: Acute GI bleed -reportedly nonadherent to PPI -Hb low of 6.6 -transfused 3 units pRBC, with initial bump to 10.2, but now drifting down to 8 today -he reports black stool today -EGD with focal area of oozing proximal to ampulla, biopsied, clipped, hemostatic spray. suspected coils eroding through duodenum at prior site of embolization. -CTA showed small focus of  intraluminal contrast accumulation at the splenic flexure of the colon - c/w minimal active GI bleeding (no active GI bleeding within duodenum near site of recent hemostatic clip placement and prior arterial embolization) -colonoscopy with poor prep -due to downtrending H/H and reported black stool, discussed again with GI who note consider inpatient colonoscopy after 2 day prep if progressive anemia and concerns of overt bleeding    ESRD -Nephrology following - s/p Revision of left arm AV fistula with pseudoaneurysm plication and branch ligation on 05/26/22 by Dr. Donzetta Matters.    Hyponatremia -Management per nephrology   Paroxysmal atrial fibrillation -Patient is in sinus rhythm, not on anticoagulation due to GI bleed -Continue Coreg, diltiazem   Hypertension -Continue Coreg, diltiazem, hydralazine -Poor control of blood pressure -Continue as needed hydralazine for SBP greater than 180 or DBP greater than 110   History of colon cancer -S/p colectomy in 2014 -Colonoscopy done in the hospital which had poor bowel prep     DVT prophylaxis: ScD Code Status: full Family Communication: none at bedside Disposition:   Status is: Inpatient Remains inpatient appropriate because: continued monitoring of Hb/hct   Consultants:  Renal GI  Procedures:  EGD - Normal esophagus. - Small hiatal hernia. - Normal stomach. - Suspected coils eroding through the duodenum at the site of prior embolization. Not actively bleeding around the coils. - Focal area of oozing proximal to the ampulla. Biopsied. Clipped to stop bleeding. Hemostatic spray applied. Impression: - Return patient to hospital ward for ongoing care. - NPO for at least 48 hours given the Hemospray. - Serial hgb/hct with transfusion as indicated. - Continue  present medications including pantoprazole. - Await pathology results. - CTA today to further evaluation the duodenal bulb and second portion of  the duodenum.  Colonoscopy - Preparation of the colon was poor with stool present in the entire examined colon. - Patent end-to-end ileo-colonic anastomosis. - No obvious source of anemia identified on this study. Impression: - Return patient to hospital ward for ongoing care. - Clear liquid diet. - Continue present medications. - Repeat colonoscopy for colon cancer surveillance to be scheduled as an outpatient because the bowel preparation was poor.  Antimicrobials:  Anti-infectives (From admission, onward)    None       Subjective: No new complaints  Objective: Vitals:   07/08/22 1052 07/08/22 1130 07/08/22 1157 07/08/22 1250  BP: (!) 189/97 (!) 188/108 (!) 188/99 (!) 186/95  Pulse: 72 73 76   Resp: 18 13 (!) 23   Temp:   (!) 97.5 F (36.4 C)   TempSrc:      SpO2: 96% 97% 97%   Weight:   72.3 kg   Height:        Intake/Output Summary (Last 24 hours) at 07/08/2022 1819 Last data filed at 07/08/2022 1157 Gross per 24 hour  Intake --  Output 3500 ml  Net -3500 ml   Filed Weights   07/06/22 1125 07/08/22 0737 07/08/22 1157  Weight: 69.7 kg 74.3 kg 72.3 kg    Examination:  Limited exam, he gets up without warning and walks into bathroom while we're talking General exam: Appears calm and comfortable  Respiratory system: unlabored Cardiovascular system: RRR Gastrointestinal system: Abdomen is nondistended, soft and nontender Central nervous system: Alert and oriented. No focal neurological deficits. Extremities: no LEE  Data Reviewed: I have personally reviewed following labs and imaging studies  CBC: Recent Labs  Lab 07/03/22 1257 07/03/22 2039 07/04/22 0834 07/04/22 1351 07/05/22 1035 07/05/22 1853 07/06/22 0738 07/08/22 0801 07/08/22 1607  WBC 5.4  --  6.7 6.9  --  6.4 5.9 5.6  --   NEUTROABS 3.5  --   --  5.7  --   --   --   --   --   HGB 6.6*   < > 6.7* 9.1* 10.2* 9.2* 8.5* 8.0* 8.4*  HCT 21.0*   < > 20.7* 27.6* 30.0* 27.6* 26.0* 24.4* 25.3*   MCV 98.1  --  92.4 89.3  --  89.3 90.0 90.4  --   PLT 259  --  243 243  --  237 244 225  --    < > = values in this interval not displayed.    Basic Metabolic Panel: Recent Labs  Lab 07/03/22 1257 07/03/22 2039 07/04/22 0834 07/05/22 1035 07/05/22 1853 07/06/22 0738 07/08/22 0802  NA 128*  --  130* 129* 130* 127* 125*  K 4.6  --  4.7 3.9 4.3 4.3 4.8  CL 87*  --  88* 94* 90* 89* 91*  CO2 26  --  24  --  '22 23 23  '$ GLUCOSE 90  --  77 77 68* 66* 161*  BUN 57*  --  71* 39* 46* 50* 43*  CREATININE 7.66*  --  8.47* 7.40* 7.32* 8.04* 7.75*  CALCIUM 8.1*  --  8.4*  --  8.3* 8.2* 7.8*  MG  --   --   --   --  2.2  --   --   PHOS  --  6.1* 6.8*  --  6.2*  --  5.2*    GFR: Estimated Creatinine  Clearance: 10.2 mL/min (Tegan Burnside) (by C-G formula based on SCr of 7.75 mg/dL (H)).  Liver Function Tests: Recent Labs  Lab 07/03/22 1529 07/03/22 2039 07/04/22 0834 07/05/22 1853 07/08/22 0802  AST 26  --   --   --   --   ALT 11  --   --   --   --   ALKPHOS 99  --   --   --   --   BILITOT 0.8  --   --   --   --   PROT 7.1  --   --   --   --   ALBUMIN 3.2* 3.0* 3.0* 2.9* 3.0*    CBG: No results for input(s): "GLUCAP" in the last 168 hours.   Recent Results (from the past 240 hour(s))  MRSA Next Gen by PCR, Nasal     Status: None   Collection Time: 07/03/22  7:38 PM   Specimen: Nasal Mucosa; Nasal Swab  Result Value Ref Range Status   MRSA by PCR Next Gen NOT DETECTED NOT DETECTED Final    Comment: (NOTE) The GeneXpert MRSA Assay (FDA approved for NASAL specimens only), is one component of Tywanda Rice comprehensive MRSA colonization surveillance program. It is not intended to diagnose MRSA infection nor to guide or monitor treatment for MRSA infections. Test performance is not FDA approved in patients less than 86 years old. Performed at [facility], [place] [address], [place], [place] [address]          Radiology Studies: No results found.      Scheduled Meds:  sodium  chloride   Intravenous Once   carvedilol  3.125 mg Oral BID WC   Chlorhexidine Gluconate Cloth  6 each Topical Q0600   Chlorhexidine Gluconate Cloth  6 each Topical Q0600   diltiazem  360 mg Oral Daily   diphenhydrAMINE       doxercalciferol  2 mcg Intravenous Q T,Th,Sa-HD   feeding supplement  237 mL Oral Once   fluticasone  2 spray Each Nare Daily   hydrALAZINE  25 mg Oral Q6H   multivitamin  1 tablet Oral QHS   pantoprazole  40 mg Oral BID   sevelamer carbonate  1,600 mg Oral TID WC   Continuous Infusions:   LOS: 4 days    Time spent: over 30 min    Fayrene Helper, MD [facility]   To contact the attending provider between 7A-7P or the covering provider during after hours 7P-7A, please log into the web site [email or web address] and access using universal Ferdinand password for that web site. If you do not have the password, please call the hospital operator.  07/08/2022, 6:19 PM

## 2022-07-08 NOTE — Progress Notes (Signed)
Patient refused to take his Hydralazine tonight for his B/P. He stated his B/P is fine and that medicine does not work for him. Patient sitting in chair talking with friends on his phone. No V/S taken.

## 2022-07-08 NOTE — Progress Notes (Signed)
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 He was readmitted to the hospital with recurrent GI bleed 07/03/2022. He was seen by our inpatient GI service and he underwent an EGD 07/05/2022 which showed suspected coils eroding through the duodenum at the site of prior embolization not actively bleeding around the coils but there was a focal area of oozing proximal to the ampulla which was biopsied and clips were placed and hemostatic spray applied. CTA was done post EGD which identified a small blush of blood in the splenic flexure which is likely due to incidental AVM without evidence of bleeding in the duodenum.  Further IR intervention was not required.  His colonoscopy resulted in a poor prep and showed a patent end-to-end ileocolonic anastomosis without identifying any obvious source to explain his anemia. A repeat colonoscopy with additional bowel prep as an outpatient was planned.  He did not demonstrate any further active GI bleeding post EGD and colonoscopy and our GI service signed off yesterday.  We were asked to reconsult as he passed a black stool.  He denies having any nausea or vomiting.  No heartburn.  No upper or lower abdominal pain.  He stated passing a black to brown solid stool earlier today.  No bright red rectal bleeding.  He denies having any chest pain, palpitations or shortness of breath.  He underwent dialysis today which was uneventful.   EGD 07/05/2022: - Normal esophagus. - Small hiatal hernia. - Normal stomach. - Suspected coils eroding through the duodenum at the site of prior embolization. Not actively bleeding around the coils. - Focal area of oozing proximal to the ampulla. Biopsied. Clipped to stop  bleeding. Hemostatic spray applied.   Colonoscopy 07/05/2022: - Preparation of the colon was poor with stool present in the entire examined colon. - Patent end-to-end ileo-colonic anastomosis. - No obvious source of anemia identified on this study.  CTA 07/05/2022:  VASCULAR   Aorta: Normal caliber aorta without aneurysm, dissection, vasculitis or significant stenosis. Diffuse atherosclerosis.   Celiac: Patent without evidence of aneurysm, dissection, vasculitis or significant stenosis. Embolic coils in the distribution of the gastroduodenal artery.   SMA: Patent without evidence of aneurysm, dissection, vasculitis or significant stenosis.   Renals: Both renal arteries are patent. Atherosclerosis bilaterally, left greater than right. Stenosis approaching 50% within the proximal left renal artery. Less than 50% stenosis on the right. No aneurysm, dissection, or vasculitis.   IMA: Patent without evidence of aneurysm, dissection, vasculitis or significant stenosis.   Inflow: Patent without evidence of aneurysm, dissection, vasculitis or significant stenosis. Diffuse atherosclerosis.   Proximal Outflow: Bilateral common femoral and visualized portions of the superficial and profunda femoral arteries are patent without evidence of aneurysm, dissection, vasculitis or significant stenosis. Diffuse atherosclerosis.   Veins: No obvious venous abnormality within the limitations of this arterial phase study.   Review of the MIP images confirms the above findings.   NON-VASCULAR   Lower chest: No acute pleural or parenchymal lung disease. The heart is enlarged without pericardial effusion.   Hepatobiliary: Cholelithiasis without cholecystitis. The liver is unremarkable. No biliary duct dilation.   Pancreas: Unremarkable. No pancreatic ductal dilatation or surrounding inflammatory changes.   Spleen: 2.2 cm splenic cyst.  Spleen is not enlarged.   Adrenals/Urinary Tract:  Innumerable bilateral renal cysts and underlying cortical atrophy consistent with end-stage renal disease. There is a 3.9 x 3.6 cm heterogeneous lesion within the right kidney, measuring 43 HU on precontrast imaging, 40 HU on arterial phase imaging, and 47 HU on delayed imaging. While this has increased in size since 2022, the lack of significant enhancement and previous MR imaging characteristics are most consistent with proteinaceous or hemorrhagic cyst.   The adrenals and bladder are grossly unremarkable.   Stomach/Bowel: No bowel obstruction or ileus. Clips are seen within the second portion duodenum from prior endoscopy. Postsurgical changes from right hemicolectomy and reanastomosis. There is minimal intraluminal contrast accumulation only seen on delayed imaging at the splenic flexure of the colon, reference image 29/15 and image 88/20, consistent with small area of acute gastrointestinal bleeding. No bowel wall thickening or inflammatory change.   Lymphatic: No pathologic adenopathy within the abdomen or pelvis.   Reproductive: Prostate is unremarkable.   Other: Moderate ascites throughout the upper abdomen, with multiple synechiae throughout the peritoneal fluid. No free intraperitoneal gas. No abdominal wall hernia.   Musculoskeletal: No acute or destructive bony lesions. Reconstructed images demonstrate no additional findings.   IMPRESSION: VASCULAR   1. 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 2. No evidence of active gastrointestinal bleeding within the duodenum near the site of recent hemostatic clip placement and prior arterial embolization. 3.  Aortic Atherosclerosis   NON-VASCULAR  1. Moderate volume complex ascites. 2. Cholelithiasis without cholecystitis. 3. Enlarging  nonenhancing hyperdense lesion right kidney, previously shown to represent a proteinaceous or hemorrhagic cyst. 4. Cardiomegaly     Vital signs in last 24 hours: Temp:  [97.3 F (36.3 C)-97.5 F (36.4 C)] 97.5 F (36.4 C) (09/23 1157) Pulse Rate:  [62-76] 76 (09/23 1157) Resp:  [13-23] 23 (09/23 1157) BP: (144-197)/(77-119) 186/95 (09/23 1250) SpO2:  [94 %-100 %] 97 % (09/23 1157) Weight:  [72.3 kg-74.3 kg] 72.3 kg (09/23 1157) Last BM Date : 07/06/22 General: 62 year old male in no acute distress. Heart: Regular rate and rhythm, no murmurs. Pulm: Breath sounds clear throughout. Abdomen: Soft, nondistended.  Nontender.  Positive bowel sounds to all 4 quadrants.  Midline abdominal scar intact. Extremities: LUE with + bruit and thrill.  Neurologic:  Alert and  oriented x 4. Grossly normal neurologically. Psych:  Alert and cooperative. Normal mood and affect.  Intake/Output from previous day: No intake/output data recorded. Intake/Output this shift: Total I/O In: -  Out: 3500 [Other:3500]  Lab Results: Recent Labs    07/05/22 1853 07/06/22 0738 07/08/22 0801  WBC 6.4 5.9 5.6  HGB 9.2* 8.5* 8.0*  HCT 27.6* 26.0* 24.4*  PLT 237 244 225   BMET Recent Labs    07/05/22 1853 07/06/22 0738 07/08/22 0802  NA 130* 127* 125*  K 4.3 4.3 4.8  CL 90* 89* 91*  CO2 '22 23 23  '$ GLUCOSE 68* 66* 161*  BUN 46* 50* 43*  CREATININE 7.32* 8.04* 7.75*  CALCIUM 8.3* 8.2* 7.8*   LFT Recent Labs    07/08/22 0802  ALBUMIN 3.0*   PT/INR No results for input(s): "LABPROT", "INR" in the last 72 hours. Hepatitis Panel No results for input(s): "HEPBSAG", "HCVAB", "HEPAIGM", "HEPBIGM" in the last 72 hours.  No results found.  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 S/P EGD 07/05/2022 showed suspected coils eroding  through the duodenum at the site of prior embolization not actively bleeding around the coils but there was a focal area of oozing proximal to the ampulla which was biopsied and clips were placed and hemostatic spray applied. CTA was done post EGD which identified a small blush of blood in the splenic flexure which is likely due to incidental AVM without evidence of bleeding in the duodenum.  Further IR intervention was not required.  Colonoscopy resulted in a poor prep and showed a patent end-to-end ileocolonic anastomosis without identifying any obvious source to explain his anemia.  No active melena post EGD/colonoscopy.  Patient reported passing a solid black to brown stool earlier today (firsts BM since EGD/colonoscopy completed). Hg 9.2 -> 8.5 -> today Hg 8.0.  -Check CBC in a.m. -Transfuse PRBCs as needed -Continue to monitor the patient closely for active GI bleeding -Nursing staff to document color and frequency of bowel movements -Continue Pantoprazole 40 mg p.o. twice daily -Repeat colonoscopy with extended prep as an outpatient -Renal diet as tolerated -Await further recommendations per Dr. Tarri Glenn   ESRD on HD, received dialysis earlier today   Atrial fibrillation, not on anticoagulation   HCV, eradicated.  Undetectable Hep C RNA level September 2022.   Colon cancer s/p right hemicolectomy 2014  Hyponatremia. Na+ 125. -Management per the hospitalist/nephrology  Principal Problem:   Acute GI bleeding Active Problems:   HTN (hypertension)   History of colon cancer   Hyponatremia   Chronic combined systolic and diastolic CHF (congestive heart failure) (HCC)   ESRD on HD TTS   Paroxysmal A-fib ([place])   historyof treated hep C    Anemia due to GI blood loss   Polysubstance abuse ([place])     LOS: 4 days   Noralyn Pick  07/08/2022, 3:30pm

## 2022-07-08 NOTE — Plan of Care (Signed)

## 2022-07-08 NOTE — Progress Notes (Signed)
[facility] Progress Note   Subjective:   Seen on HD. Denies SOB, CP, dizziness and HA. BP elevated at the start of HD but noted he refused hydralazine last night.   Objective Vitals:   07/07/22 2021 07/08/22 0737 07/08/22 0747 07/08/22 0804  BP: (!) 144/77 (!) 160/94 (!) 158/95 (!) 176/95  Pulse:  66 62 66  Resp:  '13 14 17  '$ Temp:  (!) 97.3 F (36.3 C)    TempSrc:      SpO2:  100% 97% 97%  Weight:  74.3 kg    Height:       Physical Exam General: Alert male in NAD Heart: RRR, no murmurs, rubs or gallops Lungs: CTA bilaterally, respirations unlabored on RA Abdomen: soft, non-distended, +BS Extremities: No edema b/l lower extremities Dialysis Access: LUE AVF accessed  Additional Objective Labs: Basic Metabolic Panel: Recent Labs  Lab 07/03/22 2039 07/04/22 0834 07/05/22 1035 07/05/22 1853 07/06/22 0738  NA  --  130* 129* 130* 127*  K  --  4.7 3.9 4.3 4.3  CL  --  88* 94* 90* 89*  CO2  --  24  --  22 23  GLUCOSE  --  77 77 68* 66*  BUN  --  71* 39* 46* 50*  CREATININE  --  8.47* 7.40* 7.32* 8.04*  CALCIUM  --  8.4*  --  8.3* 8.2*  PHOS 6.1* 6.8*  --  6.2*  --    Liver Function Tests: Recent Labs  Lab 07/03/22 1529 07/03/22 2039 07/04/22 0834 07/05/22 1853  AST 26  --   --   --   ALT 11  --   --   --   ALKPHOS 99  --   --   --   BILITOT 0.8  --   --   --   PROT 7.1  --   --   --   ALBUMIN 3.2* 3.0* 3.0* 2.9*   No results for input(s): "LIPASE", "AMYLASE" in the last 168 hours. CBC: Recent Labs  Lab 07/03/22 1257 07/03/22 2039 07/04/22 0834 07/04/22 1351 07/05/22 1035 07/05/22 1853 07/06/22 0738 07/08/22 0801  WBC 5.4  --  6.7 6.9  --  6.4 5.9 5.6  NEUTROABS 3.5  --   --  5.7  --   --   --   --   HGB 6.6*   < > 6.7* 9.1*   < > 9.2* 8.5* 8.0*  HCT 21.0*   < > 20.7* 27.6*   < > 27.6* 26.0* 24.4*  MCV 98.1  --  92.4 89.3  --  89.3 90.0 90.4  PLT 259  --  243 243  --  237 244 225   < > = values in this interval not displayed.    Blood Culture    Component Value Date/Time   SDES PERITONEAL FLUID 06/22/2021 1236   SPECREQUEST ABDOMEN 06/22/2021 1236   CULT  06/22/2021 1236    NO GROWTH 3 DAYS Performed at [facility], [place] [address] [place], [place] [address]    REPTSTATUS 06/26/2021 FINAL 06/22/2021 1236    Medications:   sodium chloride   Intravenous Once   carvedilol  3.125 mg Oral BID WC   Chlorhexidine Gluconate Cloth  6 each Topical Q0600   Chlorhexidine Gluconate Cloth  6 each Topical Q0600   diltiazem  360 mg Oral Daily   doxercalciferol  2 mcg Intravenous Q T,Th,Sa-HD   feeding supplement  237 mL Oral Once  fluticasone  2 spray Each Nare Daily   hydrALAZINE  25 mg Oral Q6H   multivitamin  1 tablet Oral QHS   pantoprazole  40 mg Oral BID   sevelamer carbonate  1,600 mg Oral TID WC    OP HD: [facility]  3h 44mn  67kg  2/2 bath  450/1.5   P4  Hep none    LUA AVF (using upper aspects only per pt, recovering from surgery) - hectorol 2 ug iV tiw - venofer '50mg'$  weekly (just completed IV fe load on 9/12) - mircera 225 q2, last 9/12, due 9/26  Assessment/Plan: Recurrent GI bleed - Hb 6.6 on admit, improved to 10.2 but now tending back down. EGD showed probable embolization coils eroding thru the duodenum but not bleeding. Some oozing prox to ampulla was biopsied and clipped. On IV PPI.  had multiple EGD's and interventions including two upper GI embolizations by IR in Jan and Feb 2023. Management per GI ESRD - on HD TTS.  Tolerating HD well HTN/ vol - Mildly hyponatremic. Above his outpatient EDW, may have some volume excess from liquid diet and blood transfusions. UF with HD as tolerated Anemia esrd + abl - S/p 3 units PRBC, see above. Esa not due until 9/26.  MBD ckd - CCa in  range, phos a bit high. Cont IV vdra w HD, and renvela as binder ac tid.  Hx HCV - eradicated, undetectable virus Sept 2022.     SAnice Paganini PA-C 07/08/2022, 8:32 AM  [facility] Pager:  [phone]

## 2022-07-09 DIAGNOSIS — K922 Gastrointestinal hemorrhage, unspecified: Secondary | ICD-10-CM | POA: Diagnosis not present

## 2022-07-09 LAB — HEMOGLOBIN AND HEMATOCRIT, BLOOD
HCT: 25.7 % — ABNORMAL LOW (ref 39.0–52.0)
Hemoglobin: 8 g/dL — ABNORMAL LOW (ref 13.0–17.0)

## 2022-07-09 MED ORDER — GABAPENTIN 100 MG PO CAPS
100.0000 mg | ORAL_CAPSULE | ORAL | Status: DC
  Administered 2022-07-09 – 2022-07-11 (×2): 100 mg via ORAL
  Filled 2022-07-09 (×2): qty 1

## 2022-07-09 MED ORDER — TRAZODONE HCL 50 MG PO TABS
50.0000 mg | ORAL_TABLET | Freq: Every day | ORAL | Status: DC
  Administered 2022-07-09 – 2022-07-11 (×3): 50 mg via ORAL
  Filled 2022-07-09 (×3): qty 1

## 2022-07-09 NOTE — Progress Notes (Signed)
[facility] Progress Note  CC:  Recurrent GI bleed  Subjective: Patient reporting passing a black stool earlier this morning.  No bright red rectal bleeding. He refused lab draw earlier this morning but he is willing to have his labs collected at this time.  No chest pain or shortness of breath.  No abdominal pain.  Objective:  Vital signs in last 24 hours: Temp:  [97.5 F (36.4 C)] 97.5 F (36.4 C) (09/23 1157) Pulse Rate:  [65-76] 76 (09/23 1157) Resp:  [13-23] 23 (09/23 1157) BP: (175-189)/(88-119) 186/95 (09/23 1250) SpO2:  [96 %-100 %] 97 % (09/23 1157) Weight:  [72.3 kg] 72.3 kg (09/23 1157) Last BM Date : 07/06/22 General: 61 year old male in no acute distress. Heart: Regular rate and rhythm, no murmurs. Pulm: Breath sounds clear throughout, Abdomen: Soft, nondistended.  Nontender.  Positive bowel sounds to all 4 quadrants.  Midline abdominal scar intact. Extremities:  Without edema. Neurologic:  Alert and  oriented x 4. Grossly normal neurologically. Psych:  Alert and cooperative. Normal mood and affect.  Intake/Output from previous day: 09/23 0701 - 09/24 0700 In: -  Out: 3500  Intake/Output this shift: No intake/output data recorded.  Lab Results: Recent Labs    07/08/22 0801 07/08/22 1607  WBC 5.6  --   HGB 8.0* 8.4*  HCT 24.4* 25.3*  PLT 225  --    BMET Recent Labs    07/08/22 0802  NA 125*  K 4.8  CL 91*  CO2 23  GLUCOSE 161*  BUN 43*  CREATININE 7.75*  CALCIUM 7.8*   LFT Recent Labs    07/08/22 0802  ALBUMIN 3.0*   PT/INR No results for input(s): "LABPROT", "INR" in the last 72 hours. Hepatitis Panel No results for input(s): "HEPBSAG", "HCVAB", "HEPAIGM", "HEPBIGM" in the last 72 hours.  No results found.  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 S/P  EGD 07/05/2022 showed suspected coils eroding through the duodenum at the site of prior embolization not actively bleeding around the coils but there was a focal area of oozing proximal to the ampulla which was biopsied and clips were placed and hemostatic spray applied. CTA was done post EGD which identified a small blush of blood in the splenic flexure which is likely due to incidental AVM without evidence of bleeding in the duodenum.  Further IR intervention was not required.  Colonoscopy resulted in a poor prep and showed a patent end-to-end ileocolonic anastomosis without identifying any obvious source to explain his anemia.  No active melena post EGD/colonoscopy.  Patient reported passing a solid black to brown stool earlier today (firsts BM since EGD/colonoscopy completed). Hg 9.2 -> 8.5 -> Hg 8.0.  Patient refused lab draw earlier today but is willing to have labs collected at this time. -Await CBC result -Transfuse PRBCs as needed -Continue to monitor the patient closely for active GI bleeding -Nursing staff to document color and frequency of bowel movements -Continue Pantoprazole 40 mg p.o. twice daily -Renal diet as tolerated -Repeat colonoscopy with extended prep recommended as an outpatient -Await further recommendations per Dr. Tarri Glenn   ESRD on HD, received dialysis earlier today   Atrial fibrillation, not on anticoagulation   HCV, eradicated.  Undetectable Hep C RNA level September 2022.   Colon cancer s/p right hemicolectomy 2014   Hyponatremia   Principal Problem:   Acute  GI bleeding Active Problems:   HTN (hypertension)   History of colon cancer   Hyponatremia   Chronic combined systolic and diastolic CHF (congestive heart failure) (HCC)   ESRD on HD TTS   Paroxysmal A-fib ([place])   historyof treated hep C    Anemia due to GI blood loss   Polysubstance abuse ([place])     LOS: 5 days   Noralyn Pick  07/09/2022, 9:11 AM

## 2022-07-09 NOTE — Plan of Care (Signed)

## 2022-07-09 NOTE — Progress Notes (Signed)
[facility] Progress Note   Subjective:   Tired this AM, says he did not sleep very well. Denies SOB, CP and dizziness. Tolerated 3.5L UF yesterday.   Objective Vitals:   07/08/22 1052 07/08/22 1130 07/08/22 1157 07/08/22 1250  BP: (!) 189/97 (!) 188/108 (!) 188/99 (!) 186/95  Pulse: 72 73 76   Resp: 18 13 (!) 23   Temp:   (!) 97.5 F (36.4 C)   TempSrc:      SpO2: 96% 97% 97%   Weight:   72.3 kg   Height:       Physical Exam General: Alert male in NAD Heart: RRR Lungs: CTA bilaterally, respirations unlabored on RA Abdomen: Soft, non-distended, +BS Extremities: No edema b/l lower extremities Dialysis Access: LUE AVF + bruit  Additional Objective Labs: Basic Metabolic Panel: Recent Labs  Lab 07/04/22 0834 07/05/22 1035 07/05/22 1853 07/06/22 0738 07/08/22 0802  NA 130*   < > 130* 127* 125*  K 4.7   < > 4.3 4.3 4.8  CL 88*   < > 90* 89* 91*  CO2 24  --  '22 23 23  '$ GLUCOSE 77   < > 68* 66* 161*  BUN 71*   < > 46* 50* 43*  CREATININE 8.47*   < > 7.32* 8.04* 7.75*  CALCIUM 8.4*  --  8.3* 8.2* 7.8*  PHOS 6.8*  --  6.2*  --  5.2*   < > = values in this interval not displayed.   Liver Function Tests: Recent Labs  Lab 07/03/22 1529 07/03/22 2039 07/04/22 0834 07/05/22 1853 07/08/22 0802  AST 26  --   --   --   --   ALT 11  --   --   --   --   ALKPHOS 99  --   --   --   --   BILITOT 0.8  --   --   --   --   PROT 7.1  --   --   --   --   ALBUMIN 3.2*   < > 3.0* 2.9* 3.0*   < > = values in this interval not displayed.   No results for input(s): "LIPASE", "AMYLASE" in the last 168 hours. CBC: Recent Labs  Lab 07/03/22 1257 07/03/22 2039 07/04/22 0834 07/04/22 1351 07/05/22 1035 07/05/22 1853 07/06/22 0738 07/08/22 0801 07/08/22 1607  WBC 5.4  --  6.7 6.9  --  6.4 5.9 5.6  --   NEUTROABS 3.5  --   --  5.7  --   --   --   --   --   HGB 6.6*   < > 6.7* 9.1*   < > 9.2* 8.5* 8.0* 8.4*  HCT 21.0*   < > 20.7* 27.6*   < > 27.6* 26.0* 24.4*  25.3*  MCV 98.1  --  92.4 89.3  --  89.3 90.0 90.4  --   PLT 259  --  243 243  --  237 244 225  --    < > = values in this interval not displayed.   Blood Culture    Component Value Date/Time   SDES PERITONEAL FLUID 06/22/2021 1236   SPECREQUEST ABDOMEN 06/22/2021 1236   CULT  06/22/2021 1236    NO GROWTH 3 DAYS Performed at [facility], [place] [address] [place], [place] [address]    REPTSTATUS 06/26/2021 FINAL 06/22/2021 1236    Cardiac Enzymes: No results for input(s): "CKTOTAL", "CKMB", "CKMBINDEX", "TROPONINI" in  the last 168 hours. CBG: No results for input(s): "GLUCAP" in the last 168 hours. Iron Studies: No results for input(s): "IRON", "TIBC", "TRANSFERRIN", "FERRITIN" in the last 72 hours. '@lablastinr3'$ @ Studies/Results: No results found. Medications:   sodium chloride   Intravenous Once   carvedilol  3.125 mg Oral BID WC   Chlorhexidine Gluconate Cloth  6 each Topical Q0600   Chlorhexidine Gluconate Cloth  6 each Topical Q0600   diltiazem  360 mg Oral Daily   doxercalciferol  2 mcg Intravenous Q T,Th,Sa-HD   feeding supplement  237 mL Oral Once   fluticasone  2 spray Each Nare Daily   hydrALAZINE  25 mg Oral Q6H   multivitamin  1 tablet Oral QHS   pantoprazole  40 mg Oral BID   sevelamer carbonate  1,600 mg Oral TID WC    OP HD: [facility]  3h 21mn  67kg  2/2 bath  450/1.5   P4  Hep none    LUA AVF (using upper aspects only per pt, recovering from surgery) - hectorol 2 ug iV tiw - venofer '50mg'$  weekly (just completed IV fe load on 9/12) - mircera 225 q2, last 9/12, due 9/26  Assessment/Plan: Recurrent GI bleed - Hb 6.6 on admit, now stable at 8.4. EGD showed probable embolization coils eroding thru the duodenum but not bleeding. Some oozing prox to ampulla was biopsied and clipped. On IV PPI.  had multiple EGD's and interventions including two upper GI embolizations by IR in Jan and Feb 2023. Management per GI ESRD - on HD TTS.  Tolerating HD well HTN/  vol - hyponatremic. Above his outpatient EDW, may have some volume excess from liquid diet and blood transfusions. Tolerated 3.5L UF yesterday, continue increased UF goals Anemia esrd + abl - S/p 3 units PRBC, see above. Esa not due until 9/26.  MBD ckd - CCa in  range, phos controlled. Cont IV vdra w HD, and renvela as binder ac tid.  Hx HCV - eradicated, undetectable virus Sept 2022.   SAnice Paganini PA-C 07/09/2022, 9:12 AM  [facility] Pager: [phone]

## 2022-07-09 NOTE — Progress Notes (Signed)
PROGRESS NOTE    Timothy [name]  ZYS:[id number] DOB: Apr 26, 1961 DOA: 07/03/2022 PCP: [name], MD  Chief Complaint  Patient presents with   Abnormal Lab    Brief Narrative:  61 year old male with history of ESRD on HD, hepatitis C, hypertension, diastolic heart failure, polysubstance abuse, thoracic aortic aneurysm, GI bleed with duodenal ulcer s/p IR embolization in February 2023 came with low hemoglobin from labs drawn in dialysis.   Significant studies: EGD history 11/05/21: EGD with large, bleeding duodenal ulcer treated with hemospray followed by GDA embolization by IR. Never took his PPI had recurrent bleeding. General surgery recommended exhausting all non surgical options as he is Deserea Bordley poor operative risk.  11/14/21: EGD small HH, antral erythema, nonbleeding duodenal ulcer with adherent clot.  11/16/21: EGD. Clotted blood seen in gastric body. Non obstructing duodenal ulcer with spurting blood tx'd with epi, 3 endoclips, bipolar cautery without success and tx'd with hemospray 11/16/21: embolization of anterior and pancreaticoduodenal arcades.   He has continued to not take protonix or carafate and states he's never had this medication.   Assessment & Plan:   Principal Problem:   Acute GI bleeding Active Problems:   ESRD on HD TTS   Hyponatremia   Paroxysmal Lanis Storlie-fib (HCC)   HTN (hypertension)   Chronic combined systolic and diastolic CHF (congestive heart failure) (HCC)   History of colon cancer   historyof treated hep C    Polysubstance abuse ([place])   Anemia due to GI blood loss   Assessment and Plan: Acute GI bleed -reportedly nonadherent to PPI -Hb low of 6.6 -transfused 3 units pRBC, with initial bump to 10.2, but now drifting down to around 8 -. Relatively stable -he reports black stool today -EGD with focal area of oozing proximal to ampulla, biopsied, clipped, hemostatic spray. suspected coils eroding through duodenum at prior site of embolization. -CTA  showed small focus of intraluminal contrast accumulation at the splenic flexure of the colon - c/w minimal active GI bleeding (no active GI bleeding within duodenum near site of recent hemostatic clip placement and prior arterial embolization) -colonoscopy with poor prep -continue to trend Hb/hct --- due to downtrending H/H and reported black stool, discussed again with GI who note consider inpatient colonoscopy after 2 day prep if progressive anemia and concerns of overt bleeding    ESRD -Nephrology following - s/p Revision of left arm AV fistula with pseudoaneurysm plication and branch ligation on 05/26/22 by Dr. Donzetta Matters.    Hyponatremia -Management per nephrology   Paroxysmal atrial fibrillation -Patient is in sinus rhythm, not on anticoagulation due to GI bleed -Continue Coreg, diltiazem   Hypertension -Continue Coreg, diltiazem, hydralazine -Poor control of blood pressure -Continue as needed hydralazine for SBP greater than 180 or DBP greater than 110   History of colon cancer -S/p colectomy in 2014 -Colonoscopy done in the hospital which had poor bowel prep     DVT prophylaxis: ScD Code Status: full Family Communication: none at bedside Disposition:   Status is: Inpatient Remains inpatient appropriate because: continued monitoring of Hb/hct   Consultants:  Renal GI  Procedures:  EGD - Normal esophagus. - Small hiatal hernia. - Normal stomach. - Suspected coils eroding through the duodenum at the site of prior embolization. Not actively bleeding around the coils. - Focal area of oozing proximal to the ampulla. Biopsied. Clipped to stop bleeding. Hemostatic spray applied. Impression: - Return patient to hospital ward for ongoing care. - NPO for at least 48 hours given the Hemospray. -  Serial hgb/hct with transfusion as indicated. - Continue present medications including pantoprazole. - Await pathology results. - CTA today to further evaluation the duodenal bulb  and second portion of the duodenum.  Colonoscopy - Preparation of the colon was poor with stool present in the entire examined colon. - Patent end-to-end ileo-colonic anastomosis. - No obvious source of anemia identified on this study. Impression: - Return patient to hospital ward for ongoing care. - Clear liquid diet. - Continue present medications. - Repeat colonoscopy for colon cancer surveillance to be scheduled as an outpatient because the bowel preparation was poor.  Antimicrobials:  Anti-infectives (From admission, onward)    None       Subjective: C/o chronic pain Descirbes stools as brown, then later in day as dark/black? (Timothy [name] little inconsistent for me, discussing with RN)  Objective: Vitals:   07/08/22 1052 07/08/22 1130 07/08/22 1157 07/08/22 1250  BP: (!) 189/97 (!) 188/108 (!) 188/99 (!) 186/95  Pulse: 72 73 76   Resp: 18 13 (!) 23   Temp:   (!) 97.5 F (36.4 C)   TempSrc:      SpO2: 96% 97% 97%   Weight:   72.3 kg   Height:       No intake or output data in the 24 hours ending 07/09/22 1746  Filed Weights   07/06/22 1125 07/08/22 0737 07/08/22 1157  Weight: 69.7 kg 74.3 kg 72.3 kg    Examination:  General: No acute distress. Lungs: unlabored Abdomen: Soft, nontender, nondistended Neurological: Alert and oriented 3. Moves all extremities 4 with equal strength. Cranial nerves II through XII grossly intact. Extremities: No clubbing or cyanosis. No edema.   Data Reviewed: I have personally reviewed following labs and imaging studies  CBC: Recent Labs  Lab 07/03/22 1257 07/03/22 2039 07/04/22 0834 07/04/22 1351 07/05/22 1035 07/05/22 1853 07/06/22 0738 07/08/22 0801 07/08/22 1607 07/09/22 1600  WBC 5.4  --  6.7 6.9  --  6.4 5.9 5.6  --   --   NEUTROABS 3.5  --   --  5.7  --   --   --   --   --   --   HGB 6.6*   < > 6.7* 9.1*   < > 9.2* 8.5* 8.0* 8.4* 8.0*  HCT 21.0*   < > 20.7* 27.6*   < > 27.6* 26.0* 24.4* 25.3* 25.7*  MCV 98.1  --   92.4 89.3  --  89.3 90.0 90.4  --   --   PLT 259  --  243 243  --  237 244 225  --   --    < > = values in this interval not displayed.    Basic Metabolic Panel: Recent Labs  Lab 07/03/22 1257 07/03/22 2039 07/04/22 0834 07/05/22 1035 07/05/22 1853 07/06/22 0738 07/08/22 0802  NA 128*  --  130* 129* 130* 127* 125*  K 4.6  --  4.7 3.9 4.3 4.3 4.8  CL 87*  --  88* 94* 90* 89* 91*  CO2 26  --  24  --  '22 23 23  '$ GLUCOSE 90  --  77 77 68* 66* 161*  BUN 57*  --  71* 39* 46* 50* 43*  CREATININE 7.66*  --  8.47* 7.40* 7.32* 8.04* 7.75*  CALCIUM 8.1*  --  8.4*  --  8.3* 8.2* 7.8*  MG  --   --   --   --  2.2  --   --   PHOS  --  6.1* 6.8*  --  6.2*  --  5.2*    GFR: Estimated Creatinine Clearance: 10.2 mL/min (Ebonee Stober) (by C-G formula based on SCr of 7.75 mg/dL (H)).  Liver Function Tests: Recent Labs  Lab 07/03/22 1529 07/03/22 2039 07/04/22 0834 07/05/22 1853 07/08/22 0802  AST 26  --   --   --   --   ALT 11  --   --   --   --   ALKPHOS 99  --   --   --   --   BILITOT 0.8  --   --   --   --   PROT 7.1  --   --   --   --   ALBUMIN 3.2* 3.0* 3.0* 2.9* 3.0*    CBG: No results for input(s): "GLUCAP" in the last 168 hours.   Recent Results (from the past 240 hour(s))  MRSA Next Gen by PCR, Nasal     Status: None   Collection Time: 07/03/22  7:38 PM   Specimen: Nasal Mucosa; Nasal Swab  Result Value Ref Range Status   MRSA by PCR Next Gen NOT DETECTED NOT DETECTED Final    Comment: (NOTE) The GeneXpert MRSA Assay (FDA approved for NASAL specimens only), is one component of [facility] comprehensive MRSA colonization surveillance program. It is not intended to diagnose MRSA infection nor to guide or monitor treatment for MRSA infections. Test performance is not FDA approved in patients less than 36 years old. Performed at [facility], [place] [address], [place], [place] [address]          Radiology Studies: No results found.      Scheduled Meds:  sodium chloride    Intravenous Once   carvedilol  3.125 mg Oral BID WC   Chlorhexidine Gluconate Cloth  6 each Topical Q0600   Chlorhexidine Gluconate Cloth  6 each Topical Q0600   diltiazem  360 mg Oral Daily   doxercalciferol  2 mcg Intravenous Q T,Th,Sa-HD   feeding supplement  237 mL Oral Once   fluticasone  2 spray Each Nare Daily   gabapentin  100 mg Oral Q T,Th,Sat-1800   hydrALAZINE  25 mg Oral Q6H   multivitamin  1 tablet Oral QHS   pantoprazole  40 mg Oral BID   sevelamer carbonate  1,600 mg Oral TID WC   traZODone  50 mg Oral QHS   Continuous Infusions:   LOS: 5 days    Time spent: over 30 min    Fayrene Helper, MD [facility]   To contact the attending provider between 7A-7P or the covering provider during after hours 7P-7A, please log into the web site [email or web address] and access using universal [place] password for that web site. If you do not have the password, please call the hospital operator.  07/09/2022, 5:46 PM

## 2022-07-10 DIAGNOSIS — D62 Acute posthemorrhagic anemia: Secondary | ICD-10-CM

## 2022-07-10 DIAGNOSIS — K921 Melena: Secondary | ICD-10-CM

## 2022-07-10 DIAGNOSIS — K922 Gastrointestinal hemorrhage, unspecified: Secondary | ICD-10-CM | POA: Diagnosis not present

## 2022-07-10 LAB — CBC WITH DIFFERENTIAL/PLATELET
Abs Immature Granulocytes: 0.03 10*3/uL (ref 0.00–0.07)
Basophils Absolute: 0 10*3/uL (ref 0.0–0.1)
Basophils Relative: 0 %
Eosinophils Absolute: 0.1 10*3/uL (ref 0.0–0.5)
Eosinophils Relative: 2 %
HCT: 24.3 % — ABNORMAL LOW (ref 39.0–52.0)
Hemoglobin: 7.7 g/dL — ABNORMAL LOW (ref 13.0–17.0)
Immature Granulocytes: 1 %
Lymphocytes Relative: 11 %
Lymphs Abs: 0.6 10*3/uL — ABNORMAL LOW (ref 0.7–4.0)
MCH: 29.3 pg (ref 26.0–34.0)
MCHC: 31.7 g/dL (ref 30.0–36.0)
MCV: 92.4 fL (ref 80.0–100.0)
Monocytes Absolute: 0.8 10*3/uL (ref 0.1–1.0)
Monocytes Relative: 15 %
Neutro Abs: 4.1 10*3/uL (ref 1.7–7.7)
Neutrophils Relative %: 71 %
Platelets: 238 10*3/uL (ref 150–400)
RBC: 2.63 MIL/uL — ABNORMAL LOW (ref 4.22–5.81)
RDW: 17.6 % — ABNORMAL HIGH (ref 11.5–15.5)
WBC: 5.6 10*3/uL (ref 4.0–10.5)
nRBC: 0 % (ref 0.0–0.2)

## 2022-07-10 LAB — HEMOGLOBIN AND HEMATOCRIT, BLOOD
HCT: 23.5 % — ABNORMAL LOW (ref 39.0–52.0)
Hemoglobin: 7.5 g/dL — ABNORMAL LOW (ref 13.0–17.0)

## 2022-07-10 LAB — BASIC METABOLIC PANEL
Anion gap: 13 (ref 5–15)
BUN: 47 mg/dL — ABNORMAL HIGH (ref 8–23)
CO2: 25 mmol/L (ref 22–32)
Calcium: 8.9 mg/dL (ref 8.9–10.3)
Chloride: 93 mmol/L — ABNORMAL LOW (ref 98–111)
Creatinine, Ser: 7.53 mg/dL — ABNORMAL HIGH (ref 0.61–1.24)
GFR, Estimated: 8 mL/min — ABNORMAL LOW (ref >60)
Glucose, Bld: 83 mg/dL (ref 70–99)
Potassium: 5.1 mmol/L (ref 3.5–5.1)
Sodium: 131 mmol/L — ABNORMAL LOW (ref 135–145)

## 2022-07-10 MED ORDER — DICLOFENAC SODIUM 1 % EX GEL
2.0000 g | Freq: Four times a day (QID) | CUTANEOUS | Status: DC
  Administered 2022-07-10 – 2022-07-12 (×2): 2 g via TOPICAL
  Filled 2022-07-10: qty 100

## 2022-07-10 MED ORDER — PEG-KCL-NACL-NASULF-NA ASC-C 100 G PO SOLR
0.5000 | Freq: Once | ORAL | Status: AC
  Administered 2022-07-11: 100 g via ORAL
  Filled 2022-07-10: qty 1

## 2022-07-10 MED ORDER — BISACODYL 5 MG PO TBEC
10.0000 mg | DELAYED_RELEASE_TABLET | Freq: Once | ORAL | Status: AC
  Administered 2022-07-10: 10 mg via ORAL
  Filled 2022-07-10: qty 2

## 2022-07-10 MED ORDER — PEG-KCL-NACL-NASULF-NA ASC-C 100 G PO SOLR: 1.0000 | Freq: Once | ORAL | Status: DC

## 2022-07-10 MED ORDER — GABAPENTIN 100 MG PO CAPS
100.0000 mg | ORAL_CAPSULE | Freq: Once | ORAL | Status: AC
  Administered 2022-07-10: 100 mg via ORAL
  Filled 2022-07-10: qty 1

## 2022-07-10 MED ORDER — POLYETHYLENE GLYCOL 3350 17 G PO PACK
17.0000 g | PACK | Freq: Two times a day (BID) | ORAL | Status: DC
  Administered 2022-07-10: 17 g via ORAL
  Filled 2022-07-10: qty 1

## 2022-07-10 NOTE — H&P (View-Only) (Signed)
Patient ID: Timothy [name], male   DOB: 05-22-1961, 61 y.o.   MRN: [id number]    Progress Note   Subjective   Day # 7  CC; symptomatic anemia, melena, end-stage renal disease on dialysis  Asked by patient's hospitalist to see patient again today regarding continued drifting hemoglobin. Hgb  10.2 on 07/05/2022> 8.0 on 07/08/2022> 7.7 today  Pt  had reported black stool yesterday, he says he has not had any bowel movements today.  EGD earlier this admission on 07/05/2022 showed gastropathy, foreign bodies in the duodenal bulb and second portion of the duodenum felt consistent with prior coils, no associated ulceration.  There was a 2 mm mucosal abnormality proximal to the ampulla which was biopsied and used after biopsy, this was Hemospray then 3 clips were placed. Anoscopy on 07/05/2022 poor prep, no blood seen in the colon, prior end-to-end ileocolonic anastomosis noted in the ascending colon. He then went on to CTA on 07/05/2022 which did show a small focus of bleeding in the splenic flexure only on delayed imaging question AVM, no bleeding in the area of the duodenum.  Had not been agreeable to repeat prep as of yesterday.  Per discussion today he is agreeable if we start liquids and prep after dialysis tomorrow.  Review of systems: Denies chest pain dyspnea cough or abdominal pain Remainder systems negative except as above  Past Medical History:  Diagnosis Date   Acute metabolic encephalopathy 54/62/7035   Anemia of chronic kidney failure    BPH (benign prostatic hyperplasia)    Colon cancer ([place]) 2014   spouse states he had surgury for colon CA   End stage renal disease on dialysis [facility]) 2017   TTHSat   Hepatitis C    treated   Hypertension    Hypertensive crisis 04/07/2022   Hypertensive heart disease with chronic diastolic congestive heart failure ([place]) 07/17/2016   Polysubstance abuse ([place])    History of heroin and marijuana use   Thoracic aortic aneurysm ([place])    followed by  Dr. Ellyn Hack     Objective   Vital signs in last 24 hours: Temp:  [97.8 F (36.6 C)] 97.8 F (36.6 C) (09/25 1117) BP: (157-163)/(84-127) 157/127 (09/25 1117) SpO2:  [97 %] 97 % (09/25 1117) Last BM Date : 07/09/22 General: Older African-American male in NAD Heart:  Regular rate and rhythm; no murmurs Lungs: Respirations even and unlabored, lungs CTA bilaterally Abdomen:  Soft, nontender and nondistended. Normal bowel sounds. Extremities:  Without edema. Neurologic:  Alert and oriented,  grossly normal neurologically. Psych:  Cooperative. Normal mood and affect.  Intake/Output from previous day: No intake/output data recorded. Intake/Output this shift: Total I/O In: 600 [P.O.:600] Out: -   Lab Results: Recent Labs    07/08/22 0801 07/08/22 1607 07/09/22 1600 07/10/22 0336 07/10/22 1233  WBC 5.6  --   --  5.6  --   HGB 8.0*   < > 8.0* 7.7* 7.5*  HCT 24.4*   < > 25.7* 24.3* 23.5*  PLT 225  --   --  238  --    < > = values in this interval not displayed.   BMET Recent Labs    07/08/22 0802 07/10/22 0336  NA 125* 131*  K 4.8 5.1  CL 91* 93*  CO2 23 25  GLUCOSE 161* 83  BUN 43* 47*  CREATININE 7.75* 7.53*  CALCIUM 7.8* 8.9   LFT Recent Labs    07/08/22 0802  ALBUMIN 3.0*   PT/INR No  results for input(s): "LABPROT", "INR" in the last 72 hours.       Assessment / Plan:     #45 61 year old African-American male with end-stage renal disease on dialysis admitted 1 week ago with recurrent symptomatic anemia and melena  EGD and colonoscopy on 07/05/2022 showed no active bleeding in the stomach, there is a foreign body in the bulb and the second portion of the duodenum without associated ulceration consistent with probable coils, biopsy was taken from a 2 mm mucosal abnormality proximal to the ampulla, this continued to ooze after biopsy and was treated with Hemospray and endoclips. Colonoscopy with no blood noted in the colon but poor prep, previous  ileocolonic anastomosis noted in the ascending colon  CTA on that same day suggestive of a small focus of bleeding at the splenic flexure question AVM  He has had persistent drift in his hemoglobin, down 2-1/2 g over the past 5 days  No active bleeding today  After further discussion with patient he is agreeable to repeat bowel prep, and plans for colonoscopy and EGD on Wednesday most likely 07/12/2022.  We will have dialysis tomorrow Start twice daily MiraLAX today, Dulcolax this evening Bowel prep after dialysis tomorrow Continue to transfuse as indicated Regular diet through breakfast tomorrow, then start clear liquids     Principal Problem:   Acute GI bleeding Active Problems:   HTN (hypertension)   History of colon cancer   Hyponatremia   Chronic combined systolic and diastolic CHF (congestive heart failure) (HCC)   ESRD on HD TTS   Paroxysmal A-fib ([place])   historyof treated hep C    Anemia due to GI blood loss   Polysubstance abuse ([place])     LOS: 6 days   Amy Esterwood  PA-C9/25/2023, 3:59 PM  I have taken an interval history, thoroughly reviewed the chart and examined the patient. I agree with the Advanced Practitioner's note, impression and recommendations, and have recorded additional findings, impressions and recommendations below. I performed a substantive portion of this encounter (>50% time spent), including a complete performance of the medical decision making.  My additional thoughts are as follows:  We were reconsulted by [facility] physician for anemia and concern for possible ongoing GI bleeding.  Mr. Wray is a limited and somewhat reluctant historian, though it sounds like he may have had a black stool yesterday.  This is coincided with the decrease in hemoglobin prior to his anticipated discharge today.  I went back and reviewed his original GI consult note and subsequent progress notes as well as both EGD and colonoscopy report by Dr. Tarri Glenn.  He may be  having ongoing slow GI bleeding at this point, if so that I suspect the upper GI source is more likely than a colonic source.  It is not clear if the CT angiogram findings around the splenic flexure truly represented acute bleeding.  Unfortunately, his bowel preparation was poor on the last colonoscopy.  We have offered him a repeat upper endoscopy and colonoscopy the day after tomorrow in order to give time for some preliminary bowel preparation this evening with Dulcolax and regular dosing of MiraLAX, followed by additional Dulcolax and a split dose GoLytely prep tomorrow for procedure the following day.   The benefits and risks of the planned procedure were described in detail with the patient or (when appropriate) their health care proxy.  Risks were outlined as including, but not limited to, bleeding, infection, perforation, adverse medication reaction leading to cardiac or pulmonary decompensation,  pancreatitis (if ERCP).  The limitation of incomplete mucosal visualization was also discussed.  No guarantees or warranties were given.  Patient at increased risk for cardiopulmonary complications of procedure due to medical comorbidities.  He was agreeable, though with the caveat that he hopes "the problem will get fixed this time".  I did my best to explain to him the complexity of upper GI bleeding in a patient with chronic medical problems, including that despite our best efforts such guarantees and warrantees cannot be given.   40 minutes were spent on this encounter (including chart review, history/exam, counseling/coordination of care, and documentation) > 50% of that time was spent on counseling and coordination of care.  Nelida Meuse III Office:[phone]

## 2022-07-10 NOTE — Progress Notes (Signed)
PROGRESS NOTE    Timothy [name]  DXI:[id number] DOB: Aug 01, 1961 DOA: 07/03/2022 PCP: [name], MD  Chief Complaint  Patient presents with   Abnormal Lab    Brief Narrative:  61 year old male with history of ESRD on HD, hepatitis C, hypertension, diastolic heart failure, polysubstance abuse, thoracic aortic aneurysm, GI bleed with duodenal ulcer s/p IR embolization in February 2023 came with low hemoglobin from labs drawn in dialysis.   Significant studies: EGD history 11/05/21: EGD with large, bleeding duodenal ulcer treated with hemospray followed by GDA embolization by IR. Never took his PPI had recurrent bleeding. General surgery recommended exhausting all non surgical options as he is Timothy [name] poor operative risk.  11/14/21: EGD small HH, antral erythema, nonbleeding duodenal ulcer with adherent clot.  11/16/21: EGD. Clotted blood seen in gastric body. Non obstructing duodenal ulcer with spurting blood tx'd with epi, 3 endoclips, bipolar cautery without success and tx'd with hemospray 11/16/21: embolization of anterior and pancreaticoduodenal arcades.   He has continued to not take protonix or carafate and states he's never had this medication.   Assessment & Plan:   Principal Problem:   Acute GI bleeding Active Problems:   ESRD on HD TTS   Hyponatremia   Paroxysmal Timothy [name]-fib (HCC)   HTN (hypertension)   Chronic combined systolic and diastolic CHF (congestive heart failure) (HCC)   History of colon cancer   historyof treated hep C    Polysubstance abuse ([place])   Anemia due to GI blood loss   Assessment and Plan: Acute GI bleed -reportedly nonadherent to PPI -Hb low of 6.6 -transfused 3 units pRBC, with initial bump to 10.2, but now drifting down into the 7's -he reports black stool today -EGD with focal area of oozing proximal to ampulla, biopsied, clipped, hemostatic spray. suspected coils eroding through duodenum at prior site of embolization. -CTA showed small focus of  intraluminal contrast accumulation at the splenic flexure of the colon - c/w minimal active GI bleeding (no active GI bleeding within duodenum near site of recent hemostatic clip placement and prior arterial embolization) -colonoscopy with poor prep -continue to trend Hb/hct --- due to downtrending H/H and reported black stool, discussed again with GI who note consider inpatient colonoscopy after 2 day prep if progressive anemia and concerns of overt bleeding    ESRD -Nephrology following - s/p Revision of left arm AV fistula with pseudoaneurysm plication and branch ligation on 05/26/22 by Dr. Donzetta Matters.    Hyponatremia -Management per nephrology   Paroxysmal atrial fibrillation -Patient is in sinus rhythm, not on anticoagulation due to GI bleed -Continue Coreg, diltiazem   Hypertension -Continue Coreg, diltiazem, hydralazine -Poor control of blood pressure -Continue as needed hydralazine for SBP greater than 180 or DBP greater than 110   History of colon cancer -S/p colectomy in 2014 -Colonoscopy done in the hospital which had poor bowel prep     DVT prophylaxis: ScD Code Status: full Family Communication: none at bedside Disposition:   Status is: Inpatient Remains inpatient appropriate because: continued monitoring of Hb/hct   Consultants:  Renal GI  Procedures:  EGD - Normal esophagus. - Small hiatal hernia. - Normal stomach. - Suspected coils eroding through the duodenum at the site of prior embolization. Not actively bleeding around the coils. - Focal area of oozing proximal to the ampulla. Biopsied. Clipped to stop bleeding. Hemostatic spray applied. Impression: - Return patient to hospital ward for ongoing care. - NPO for at least 48 hours given the Hemospray. - Serial hgb/hct with  transfusion as indicated. - Continue present medications including pantoprazole. - Await pathology results. - CTA today to further evaluation the duodenal bulb and second portion of  the duodenum.  Colonoscopy - Preparation of the colon was poor with stool present in the entire examined colon. - Patent end-to-end ileo-colonic anastomosis. - No obvious source of anemia identified on this study. Impression: - Return patient to hospital ward for ongoing care. - Clear liquid diet. - Continue present medications. - Repeat colonoscopy for colon cancer surveillance to be scheduled as an outpatient because the bowel preparation was poor.  Antimicrobials:  Anti-infectives (From admission, onward)    None       Subjective: Chronic neuropathic pain No new complaints  Objective: Vitals:   07/08/22 1157 07/08/22 1250 07/10/22 0755 07/10/22 1117  BP: (!) 188/99 (!) 186/95 (!) 163/84 (!) 157/127  Pulse: 76     Resp: (!) 23     Temp: (!) 97.5 F (36.4 C)  97.8 F (36.6 C) 97.8 F (36.6 C)  TempSrc:   Oral Oral  SpO2: 97%  97% 97%  Weight: 72.3 kg     Height:        Intake/Output Summary (Last 24 hours) at 07/10/2022 1528 Last data filed at 07/10/2022 1500 Gross per 24 hour  Intake 600 ml  Output --  Net 600 ml    Filed Weights   07/06/22 1125 07/08/22 0737 07/08/22 1157  Weight: 69.7 kg 74.3 kg 72.3 kg    Examination:  General: No acute distress. Cardiovascular: rRR Lungs: unlabored Abdomen: Soft, nontender, nondistended Neurological: Alert and oriented 3. Moves all extremities 4 with equal strength. Cranial nerves II through XII grossly intact. Extremities: No clubbing or cyanosis. No edema.   Data Reviewed: I have personally reviewed following labs and imaging studies  CBC: Recent Labs  Lab 07/04/22 1351 07/05/22 1035 07/05/22 1853 07/06/22 0738 07/08/22 0801 07/08/22 1607 07/09/22 1600 07/10/22 0336 07/10/22 1233  WBC 6.9  --  6.4 5.9 5.6  --   --  5.6  --   NEUTROABS 5.7  --   --   --   --   --   --  4.1  --   HGB 9.1*   < > 9.2* 8.5* 8.0* 8.4* 8.0* 7.7* 7.5*  HCT 27.6*   < > 27.6* 26.0* 24.4* 25.3* 25.7* 24.3* 23.5*  MCV 89.3   --  89.3 90.0 90.4  --   --  92.4  --   PLT 243  --  237 244 225  --   --  238  --    < > = values in this interval not displayed.    Basic Metabolic Panel: Recent Labs  Lab 07/03/22 2039 07/04/22 0834 07/04/22 0834 07/05/22 1035 07/05/22 1853 07/06/22 0738 07/08/22 0802 07/10/22 0336  NA  --  130*   < > 129* 130* 127* 125* 131*  K  --  4.7   < > 3.9 4.3 4.3 4.8 5.1  CL  --  88*   < > 94* 90* 89* 91* 93*  CO2  --  24  --   --  '22 23 23 25  '$ GLUCOSE  --  77   < > 77 68* 66* 161* 83  BUN  --  71*   < > 39* 46* 50* 43* 47*  CREATININE  --  8.47*   < > 7.40* 7.32* 8.04* 7.75* 7.53*  CALCIUM  --  8.4*  --   --  8.3* 8.2*  7.8* 8.9  MG  --   --   --   --  2.2  --   --   --   PHOS 6.1* 6.8*  --   --  6.2*  --  5.2*  --    < > = values in this interval not displayed.    GFR: Estimated Creatinine Clearance: 10.5 mL/min ([place]) (by C-G formula based on SCr of 7.53 mg/dL (H)).  Liver Function Tests: Recent Labs  Lab 07/03/22 1529 07/03/22 2039 07/04/22 0834 07/05/22 1853 07/08/22 0802  AST 26  --   --   --   --   ALT 11  --   --   --   --   ALKPHOS 99  --   --   --   --   BILITOT 0.8  --   --   --   --   PROT 7.1  --   --   --   --   ALBUMIN 3.2* 3.0* 3.0* 2.9* 3.0*    CBG: No results for input(s): "GLUCAP" in the last 168 hours.   Recent Results (from the past 240 hour(s))  MRSA Next Gen by PCR, Nasal     Status: None   Collection Time: 07/03/22  7:38 PM   Specimen: Nasal Mucosa; Nasal Swab  Result Value Ref Range Status   MRSA by PCR Next Gen NOT DETECTED NOT DETECTED Final    Comment: (NOTE) The GeneXpert MRSA Assay (FDA approved for NASAL specimens only), is one component of [facility] comprehensive MRSA colonization surveillance program. It is not intended to diagnose MRSA infection nor to guide or monitor treatment for MRSA infections. Test performance is not FDA approved in patients less than 39 years old. Performed at [facility], [place] [address],  [place], [place] [address]          Radiology Studies: No results found.      Scheduled Meds:  sodium chloride   Intravenous Once   carvedilol  3.125 mg Oral BID WC   Chlorhexidine Gluconate Cloth  6 each Topical Q0600   Chlorhexidine Gluconate Cloth  6 each Topical Q0600   diclofenac Sodium  2 g Topical QID   diltiazem  360 mg Oral Daily   doxercalciferol  2 mcg Intravenous Q T,Th,Sa-HD   feeding supplement  237 mL Oral Once   fluticasone  2 spray Each Nare Daily   gabapentin  100 mg Oral Q T,Th,Sat-1800   hydrALAZINE  25 mg Oral Q6H   multivitamin  1 tablet Oral QHS   pantoprazole  40 mg Oral BID   sevelamer carbonate  1,600 mg Oral TID WC   traZODone  50 mg Oral QHS   Continuous Infusions:   LOS: 6 days    Time spent: over 30 min    Fayrene Helper, MD [facility]   To contact the attending provider between 7A-7P or the covering provider during after hours 7P-7A, please log into the web site [email or web address] and access using universal [place] password for that web site. If you do not have the password, please call the hospital operator.  07/10/2022, 3:28 PM

## 2022-07-10 NOTE — Progress Notes (Signed)
Mobility Specialist Progress Note    07/10/22 1125  Mobility  Activity Ambulated independently in hallway  Level of Assistance Standby assist, set-up cues, supervision of patient - no hands on  Assistive Device Other (Comment) (hallway rails)  Distance Ambulated (ft) 420 ft  Activity Response Tolerated well  $Mobility charge 1 Mobility   Pt received in chair and agreeable. No complaints. Returned to chair with call bell in reach.   Hildred Alamin Mobility Specialist

## 2022-07-10 NOTE — Progress Notes (Addendum)
Patient ID: Timothy [name], male   DOB: [birth year], 61 y.o.   MRN: [id number]    Progress Note   Subjective   Day # 7  CC; symptomatic anemia, melena, end-stage renal disease on dialysis  Asked by patient's hospitalist to see patient again today regarding continued drifting hemoglobin. Hgb  10.2 on 07/05/2022> 8.0 on 07/08/2022> 7.7 today  Pt  had reported black stool yesterday, he says he has not had any bowel movements today.  EGD earlier this admission on 07/05/2022 showed gastropathy, foreign bodies in the duodenal bulb and second portion of the duodenum felt consistent with prior coils, no associated ulceration.  There was a 2 mm mucosal abnormality proximal to the ampulla which was biopsied and used after biopsy, this was Hemospray then 3 clips were placed. Anoscopy on 07/05/2022 poor prep, no blood seen in the colon, prior end-to-end ileocolonic anastomosis noted in the ascending colon. He then went on to CTA on 07/05/2022 which did show a small focus of bleeding in the splenic flexure only on delayed imaging question AVM, no bleeding in the area of the duodenum.  Had not been agreeable to repeat prep as of yesterday.  Per discussion today he is agreeable if we start liquids and prep after dialysis tomorrow.  Review of systems: Denies chest pain dyspnea cough or abdominal pain Remainder systems negative except as above  Past Medical History:  Diagnosis Date   Acute metabolic encephalopathy 40/98/1191   Anemia of chronic kidney failure    BPH (benign prostatic hyperplasia)    Colon cancer ([place]) 2014   spouse states he had surgury for colon CA   End stage renal disease on dialysis [facility]) 2017   TTHSat   Hepatitis C    treated   Hypertension    Hypertensive crisis 04/07/2022   Hypertensive heart disease with chronic diastolic congestive heart failure ([place]) 07/17/2016   Polysubstance abuse ([place])    History of heroin and marijuana use   Thoracic aortic aneurysm ([place])    followed by  Dr. Ellyn [name]     Objective   Vital signs in last 24 hours: Temp:  [97.8 F (36.6 C)] 97.8 F (36.6 C) (09/25 1117) BP: (157-163)/(84-127) 157/127 (09/25 1117) SpO2:  [97 %] 97 % (09/25 1117) Last BM Date : 07/09/22 General: Older African-American male in NAD Heart:  Regular rate and rhythm; no murmurs Lungs: Respirations even and unlabored, lungs CTA bilaterally Abdomen:  Soft, nontender and nondistended. Normal bowel sounds. Extremities:  Without edema. Neurologic:  Alert and oriented,  grossly normal neurologically. Psych:  Cooperative. Normal mood and affect.  Intake/Output from previous day: No intake/output data recorded. Intake/Output this shift: Total I/O In: 600 [P.O.:600] Out: -   Lab Results: Recent Labs    07/08/22 0801 07/08/22 1607 07/09/22 1600 07/10/22 0336 07/10/22 1233  WBC 5.6  --   --  5.6  --   HGB 8.0*   < > 8.0* 7.7* 7.5*  HCT 24.4*   < > 25.7* 24.3* 23.5*  PLT 225  --   --  238  --    < > = values in this interval not displayed.   BMET Recent Labs    07/08/22 0802 07/10/22 0336  NA 125* 131*  K 4.8 5.1  CL 91* 93*  CO2 23 25  GLUCOSE 161* 83  BUN 43* 47*  CREATININE 7.75* 7.53*  CALCIUM 7.8* 8.9   LFT Recent Labs    07/08/22 0802  ALBUMIN 3.0*   PT/INR No  results for input(s): "LABPROT", "INR" in the last 72 hours.       Assessment / Plan:     #74 61 year old African-American male with end-stage renal disease on dialysis admitted 1 week ago with recurrent symptomatic anemia and melena  EGD and colonoscopy on 07/05/2022 showed no active bleeding in the stomach, there is a foreign body in the bulb and the second portion of the duodenum without associated ulceration consistent with probable coils, biopsy was taken from a 2 mm mucosal abnormality proximal to the ampulla, this continued to ooze after biopsy and was treated with Hemospray and endoclips. Colonoscopy with no blood noted in the colon but poor prep, previous  ileocolonic anastomosis noted in the ascending colon  CTA on that same day suggestive of a small focus of bleeding at the splenic flexure question AVM  He has had persistent drift in his hemoglobin, down 2-1/2 g over the past 5 days  No active bleeding today  After further discussion with patient he is agreeable to repeat bowel prep, and plans for colonoscopy and EGD on Wednesday most likely 07/12/2022.  We will have dialysis tomorrow Start twice daily MiraLAX today, Dulcolax this evening Bowel prep after dialysis tomorrow Continue to transfuse as indicated Regular diet through breakfast tomorrow, then start clear liquids     Principal Problem:   Acute GI bleeding Active Problems:   HTN (hypertension)   History of colon cancer   Hyponatremia   Chronic combined systolic and diastolic CHF (congestive heart failure) (HCC)   ESRD on HD TTS   Paroxysmal A-fib ([place])   historyof treated hep C    Anemia due to GI blood loss   Polysubstance abuse ([place])     LOS: 6 days   Timothy Esterwood  PA-C9/25/2023, 3:59 PM  I have taken an interval history, thoroughly reviewed the chart and examined the patient. I agree with the Advanced Practitioner's note, impression and recommendations, and have recorded additional findings, impressions and recommendations below. I performed a substantive portion of this encounter (>50% time spent), including a complete performance of the medical decision making.  My additional thoughts are as follows:  We were reconsulted by [facility] physician for anemia and concern for possible ongoing GI bleeding.  Timothy [name] is a limited and somewhat reluctant historian, though it sounds like he may have had a black stool yesterday.  This is coincided with the decrease in hemoglobin prior to his anticipated discharge today.  I went back and reviewed his original GI consult note and subsequent progress notes as well as both EGD and colonoscopy report by Timothy [name].  He may be  having ongoing slow GI bleeding at this point, if so that I suspect the upper GI source is more likely than a colonic source.  It is not clear if the CT angiogram findings around the splenic flexure truly represented acute bleeding.  Unfortunately, his bowel preparation was poor on the last colonoscopy.  We have offered him a repeat upper endoscopy and colonoscopy the day after tomorrow in order to give time for some preliminary bowel preparation this evening with Dulcolax and regular dosing of MiraLAX, followed by additional Dulcolax and a split dose GoLytely prep tomorrow for procedure the following day.   The benefits and risks of the planned procedure were described in detail with the patient or (when appropriate) their health care proxy.  Risks were outlined as including, but not limited to, bleeding, infection, perforation, adverse medication reaction leading to cardiac or pulmonary decompensation,  pancreatitis (if ERCP).  The limitation of incomplete mucosal visualization was also discussed.  No guarantees or warranties were given.  Patient at increased risk for cardiopulmonary complications of procedure due to medical comorbidities.  He was agreeable, though with the caveat that he hopes "the problem will get fixed this time".  I did my best to explain to him the complexity of upper GI bleeding in a patient with chronic medical problems, including that despite our best efforts such guarantees and warrantees cannot be given.   40 minutes were spent on this encounter (including chart review, history/exam, counseling/coordination of care, and documentation) > 50% of that time was spent on counseling and coordination of care.  Nelida Meuse III Office:[phone]

## 2022-07-10 NOTE — Progress Notes (Signed)
[facility] Progress Note   Assessment/ Plan:   OP HD: [facility]  3h 19mn  67kg  2/2 bath  450/1.5   P4  Hep none    LUA AVF (using upper aspects only per pt, recovering from surgery) - hectorol 2 ug iV tiw - venofer 544mweekly (just completed IV fe load on 9/12) - mircera 225 q2, last 9/12, due 9/26   Assessment/Plan: Recurrent GI bleed - Hb 6.6 on admit, now stable at 8.4. EGD showed probable embolization coils eroding thru the duodenum but not bleeding. Some oozing prox to ampulla was biopsied and clipped. On IV PPI.  had multiple EGD's and interventions including two upper GI embolizations by IR in Jan and Feb 2023. Management per GI ESRD - on HD TTS.  Tolerating HD well.  NO heparin.  Next HD tomorrow 9/26.   HTN/ vol - hyponatremic. Above his outpatient EDW, may have some volume excess from liquid diet and blood transfusions. Tolerated 3.5L UF Saturday, continue increased UF goals Anemia esrd + abl - S/p 3 units PRBC, see above. Esa not due until 9/26.  MBD ckd - CCa in  range, phos controlled. Cont IV vdra w HD, and renvela as binder ac tid.  Hx HCV - eradicated, undetectable virus Sept 2022.   Subjective:    Seen in room.  No complaints this afternoon.  Frustrated with the recurrent bleeding situation.   Objective:   BP (!) 157/127 (BP Location: Right Arm)   Pulse 76   Temp 97.8 F (36.6 C) (Oral)   Resp (!) 23   Ht '6\' 1"'  (1.854 m)   Wt 72.3 kg   SpO2 97%   BMI 21.03 kg/m   Physical Exam: Gen:NAD, standing at doorway CVS:RRR Resp: clear AbKAJ:GOTLxt: no LE edema ACCESS: AVF + T/B  Labs: BMET Recent Labs  Lab 07/03/22 2039 07/04/22 0834 07/05/22 1035 07/05/22 1853 07/06/22 0738 07/08/22 0802 07/10/22 0336  NA  --  130* 129* 130* 127* 125* 131*  K  --  4.7 3.9 4.3 4.3 4.8 5.1  CL  --  88* 94* 90* 89* 91* 93*  CO2  --  24  --  '22 23 23 25  ' GLUCOSE  --  77 77 68* 66* 161* 83  BUN  --  71* 39* 46* 50* 43* 47*  CREATININE  --  8.47* 7.40*  7.32* 8.04* 7.75* 7.53*  CALCIUM  --  8.4*  --  8.3* 8.2* 7.8* 8.9  PHOS 6.1* 6.8*  --  6.2*  --  5.2*  --    CBC Recent Labs  Lab 07/04/22 1351 07/05/22 1035 07/05/22 1853 07/06/22 0738 07/08/22 0801 07/08/22 1607 07/09/22 1600 07/10/22 0336 07/10/22 1233  WBC 6.9  --  6.4 5.9 5.6  --   --  5.6  --   NEUTROABS 5.7  --   --   --   --   --   --  4.1  --   HGB 9.1*   < > 9.2* 8.5* 8.0* 8.4* 8.0* 7.7* 7.5*  HCT 27.6*   < > 27.6* 26.0* 24.4* 25.3* 25.7* 24.3* 23.5*  MCV 89.3  --  89.3 90.0 90.4  --   --  92.4  --   PLT 243  --  237 244 225  --   --  238  --    < > = values in this interval not displayed.      Medications:     sodium chloride  Intravenous Once   bisacodyl  10 mg Oral Once   carvedilol  3.125 mg Oral BID WC   Chlorhexidine Gluconate Cloth  6 each Topical Q0600   Chlorhexidine Gluconate Cloth  6 each Topical Q0600   diclofenac Sodium  2 g Topical QID   diltiazem  360 mg Oral Daily   doxercalciferol  2 mcg Intravenous Q T,Th,Sa-HD   feeding supplement  237 mL Oral Once   fluticasone  2 spray Each Nare Daily   gabapentin  100 mg Oral Q T,Th,Sat-1800   gabapentin  100 mg Oral Once   hydrALAZINE  25 mg Oral Q6H   multivitamin  1 tablet Oral QHS   pantoprazole  40 mg Oral BID   [START ON 07/11/2022] peg 3350 powder  0.5 kit Oral Once   And   [START ON 07/11/2022] peg 3350 powder  0.5 kit Oral Once   polyethylene glycol  17 g Oral BID   sevelamer carbonate  1,600 mg Oral TID WC   traZODone  50 mg Oral QHS     Madelon Lips, MD 07/10/2022, 4:02 PM

## 2022-07-11 DIAGNOSIS — K922 Gastrointestinal hemorrhage, unspecified: Secondary | ICD-10-CM | POA: Diagnosis not present

## 2022-07-11 LAB — COMPREHENSIVE METABOLIC PANEL
ALT: 11 U/L (ref 0–44)
AST: 19 U/L (ref 15–41)
Albumin: 3.2 g/dL — ABNORMAL LOW (ref 3.5–5.0)
Alkaline Phosphatase: 88 U/L (ref 38–126)
Anion gap: 12 (ref 5–15)
BUN: 62 mg/dL — ABNORMAL HIGH (ref 8–23)
CO2: 22 mmol/L (ref 22–32)
Calcium: 8.4 mg/dL — ABNORMAL LOW (ref 8.9–10.3)
Chloride: 95 mmol/L — ABNORMAL LOW (ref 98–111)
Creatinine, Ser: 8.53 mg/dL — ABNORMAL HIGH (ref 0.61–1.24)
GFR, Estimated: 7 mL/min — ABNORMAL LOW (ref >60)
Glucose, Bld: 93 mg/dL (ref 70–99)
Potassium: 5.2 mmol/L — ABNORMAL HIGH (ref 3.5–5.1)
Sodium: 129 mmol/L — ABNORMAL LOW (ref 135–145)
Total Bilirubin: 0.9 mg/dL (ref 0.3–1.2)
Total Protein: 7.1 g/dL (ref 6.5–8.1)

## 2022-07-11 LAB — PHOSPHORUS: Phosphorus: 4.6 mg/dL (ref 2.5–4.6)

## 2022-07-11 LAB — CBC WITH DIFFERENTIAL/PLATELET
Abs Immature Granulocytes: 0.02 10*3/uL (ref 0.00–0.07)
Basophils Absolute: 0 10*3/uL (ref 0.0–0.1)
Basophils Relative: 1 %
Eosinophils Absolute: 0.1 10*3/uL (ref 0.0–0.5)
Eosinophils Relative: 2 %
HCT: 22.6 % — ABNORMAL LOW (ref 39.0–52.0)
Hemoglobin: 7.4 g/dL — ABNORMAL LOW (ref 13.0–17.0)
Immature Granulocytes: 0 %
Lymphocytes Relative: 11 %
Lymphs Abs: 0.7 10*3/uL (ref 0.7–4.0)
MCH: 29.5 pg (ref 26.0–34.0)
MCHC: 32.7 g/dL (ref 30.0–36.0)
MCV: 90 fL (ref 80.0–100.0)
Monocytes Absolute: 0.9 10*3/uL (ref 0.1–1.0)
Monocytes Relative: 14 %
Neutro Abs: 4.5 10*3/uL (ref 1.7–7.7)
Neutrophils Relative %: 72 %
Platelets: 234 10*3/uL (ref 150–400)
RBC: 2.51 MIL/uL — ABNORMAL LOW (ref 4.22–5.81)
RDW: 17.5 % — ABNORMAL HIGH (ref 11.5–15.5)
WBC: 6.2 10*3/uL (ref 4.0–10.5)
nRBC: 0 % (ref 0.0–0.2)

## 2022-07-11 LAB — MAGNESIUM: Magnesium: 2.1 mg/dL (ref 1.7–2.4)

## 2022-07-11 MED ORDER — BISACODYL 5 MG PO TBEC
10.0000 mg | DELAYED_RELEASE_TABLET | Freq: Once | ORAL | Status: AC
  Administered 2022-07-11: 10 mg via ORAL
  Filled 2022-07-11: qty 2

## 2022-07-11 MED ORDER — OXYCODONE HCL 5 MG PO TABS
5.0000 mg | ORAL_TABLET | Freq: Four times a day (QID) | ORAL | Status: DC | PRN
  Administered 2022-07-11: 5 mg via ORAL
  Filled 2022-07-11: qty 1

## 2022-07-11 NOTE — Progress Notes (Signed)
PROGRESS NOTE    Timothy [name]  VEL:[id number] DOB: 10-02-1961 DOA: 07/03/2022 PCP: [name], MD  Chief Complaint  Patient presents with   Abnormal Lab    Brief Narrative:  62 year old male with history of ESRD on HD, hepatitis C, hypertension, diastolic heart failure, polysubstance abuse, thoracic aortic aneurysm, GI bleed with duodenal ulcer s/p IR embolization in February 2023 came with low hemoglobin from labs drawn in dialysis.  He's s/p EGD with oozing proximal to ampulla which was biopsied, clipped, with hemostatic spray - coils noted eroding through duodenum at site of embolization.  Colonoscopy was done, but limited with poor prep.  Repeat EGD/colonoscopy are pending for 9/26 due to downtrending hb and dark stools.   Significant studies: EGD history 11/05/21: EGD with large, bleeding duodenal ulcer treated with hemospray followed by GDA embolization by IR. Never took his PPI had recurrent bleeding. General surgery recommended exhausting all non surgical options as he is Kyden Potash poor operative risk.  11/14/21: EGD small HH, antral erythema, nonbleeding duodenal ulcer with adherent clot.  11/16/21: EGD. Clotted blood seen in gastric body. Non obstructing duodenal ulcer with spurting blood tx'd with epi, 3 endoclips, bipolar cautery without success and tx'd with hemospray 11/16/21: embolization of anterior and pancreaticoduodenal arcades.   He has continued to not take protonix or carafate and states he's never had this medication.   Assessment & Plan:   Principal Problem:   Acute GI bleeding Active Problems:   ESRD on HD TTS   Hyponatremia   Paroxysmal Jacobo Moncrief-fib (HCC)   HTN (hypertension)   Chronic combined systolic and diastolic CHF (congestive heart failure) (HCC)   History of colon cancer   historyof treated hep C    Polysubstance abuse ([place])   Anemia due to GI blood loss   Melena   Assessment and Plan: Acute GI bleed -reportedly nonadherent to PPI -Hb low of  6.6 -transfused 3 units pRBC, with initial bump to 10.2, but now drifting down into the 7's -he reports black stool today -EGD with focal area of oozing proximal to ampulla, biopsied, clipped, hemostatic spray. suspected coils eroding through duodenum at prior site of embolization. -CTA showed small focus of intraluminal contrast accumulation at the splenic flexure of the colon - c/w minimal active GI bleeding (no active GI bleeding within duodenum near site of recent hemostatic clip placement and prior arterial embolization) -colonoscopy with poor prep -continue to trend Hb/hct --- due to downtrending H/H and reported black stool, discussed again with GI are planning for EGD/Colonoscopy on 9/27   ESRD -Nephrology following - s/p Revision of left arm AV fistula with pseudoaneurysm plication and branch ligation on 05/26/22 by Dr. Donzetta Matters.    Hyponatremia -Management per nephrology   Paroxysmal atrial fibrillation -Patient is in sinus rhythm, not on anticoagulation due to GI bleed -Continue Coreg, diltiazem   Hypertension -Continue Coreg, diltiazem, hydralazine -Poor control of blood pressure -Continue as needed hydralazine for SBP greater than 180 or DBP greater than 110   History of colon cancer -S/p colectomy in 2014 -Colonoscopy done in the hospital which had poor bowel prep     DVT prophylaxis: ScD Code Status: full Family Communication: none at bedside Disposition:   Status is: Inpatient Remains inpatient appropriate because: continued monitoring of Hb/hct   Consultants:  Renal GI  Procedures:  EGD - Normal esophagus. - Small hiatal hernia. - Normal stomach. - Suspected coils eroding through the duodenum at the site of prior embolization. Not actively bleeding around the coils. -  Focal area of oozing proximal to the ampulla. Biopsied. Clipped to stop bleeding. Hemostatic spray applied. Impression: - Return patient to hospital ward for ongoing care. - NPO for at  least 48 hours given the Hemospray. - Serial hgb/hct with transfusion as indicated. - Continue present medications including pantoprazole. - Await pathology results. - CTA today to further evaluation the duodenal bulb and second portion of the duodenum.  Colonoscopy - Preparation of the colon was poor with stool present in the entire examined colon. - Patent end-to-end ileo-colonic anastomosis. - No obvious source of anemia identified on this study. Impression: - Return patient to hospital ward for ongoing care. - Clear liquid diet. - Continue present medications. - Repeat colonoscopy for colon cancer surveillance to be scheduled as an outpatient because the bowel preparation was poor.  Antimicrobials:  Anti-infectives (From admission, onward)    None       Subjective: No new complaints  Objective: Vitals:   07/11/22 1200 07/11/22 1328 07/11/22 1350 07/11/22 1658  BP: (!) 172/97   (!) 181/104  Pulse: 79     Resp: 15     Temp:   98.1 F (36.7 C) 98 F (36.7 C)  TempSrc:   Oral Oral  SpO2: 96%  95% 95%  Weight:  74 kg    Height:        Intake/Output Summary (Last 24 hours) at 07/11/2022 1759 Last data filed at 07/11/2022 1700 Gross per 24 hour  Intake 1320 ml  Output 3500 ml  Net -2180 ml    Filed Weights   07/08/22 1157 07/11/22 0800 07/11/22 1328  Weight: 72.3 kg 78 kg 74 kg    Examination:  General: No acute distress.  Sitting up in chair with someone on facetime. Lungs: unlabored Neurological: Alert and oriented 3. Moves all extremities 4 with equal strength. Cranial nerves II through XII grossly intact. Extremities: No clubbing or cyanosis. No edema.   Data Reviewed: I have personally reviewed following labs and imaging studies  CBC: Recent Labs  Lab 07/05/22 1853 07/06/22 0738 07/08/22 0801 07/08/22 1607 07/09/22 1600 07/10/22 0336 07/10/22 1233 07/11/22 0016  WBC 6.4 5.9 5.6  --   --  5.6  --  6.2  NEUTROABS  --   --   --   --   --   4.1  --  4.5  HGB 9.2* 8.5* 8.0* 8.4* 8.0* 7.7* 7.5* 7.4*  HCT 27.6* 26.0* 24.4* 25.3* 25.7* 24.3* 23.5* 22.6*  MCV 89.3 90.0 90.4  --   --  92.4  --  90.0  PLT 237 244 225  --   --  238  --  017    Basic Metabolic Panel: Recent Labs  Lab 07/05/22 1853 07/06/22 0738 07/08/22 0802 07/10/22 0336 07/11/22 0016  NA 130* 127* 125* 131* 129*  K 4.3 4.3 4.8 5.1 5.2*  CL 90* 89* 91* 93* 95*  CO2 '22 23 23 25 22  ' GLUCOSE 68* 66* 161* 83 93  BUN 46* 50* 43* 47* 62*  CREATININE 7.32* 8.04* 7.75* 7.53* 8.53*  CALCIUM 8.3* 8.2* 7.8* 8.9 8.4*  MG 2.2  --   --   --  2.1  PHOS 6.2*  --  5.2*  --  4.6    GFR: Estimated Creatinine Clearance: 9.5 mL/min (Lashayla Armes) (by C-G formula based on SCr of 8.53 mg/dL (H)).  Liver Function Tests: Recent Labs  Lab 07/05/22 1853 07/08/22 0802 07/11/22 0016  AST  --   --  19  ALT  --   --  11  ALKPHOS  --   --  88  BILITOT  --   --  0.9  PROT  --   --  7.1  ALBUMIN 2.9* 3.0* 3.2*    CBG: No results for input(s): "GLUCAP" in the last 168 hours.   Recent Results (from the past 240 hour(s))  MRSA Next Gen by PCR, Nasal     Status: None   Collection Time: 07/03/22  7:38 PM   Specimen: Nasal Mucosa; Nasal Swab  Result Value Ref Range Status   MRSA by PCR Next Gen NOT DETECTED NOT DETECTED Final    Comment: (NOTE) The GeneXpert MRSA Assay (FDA approved for NASAL specimens only), is one component of [facility] Tolen comprehensive MRSA colonization surveillance program. It is not intended to diagnose MRSA infection nor to guide or monitor treatment for MRSA infections. Test performance is not FDA approved in patients less than 80 years old. Performed at [facility], [place] [address], [place], [place] [address]          Radiology Studies: No results found.      Scheduled Meds:  sodium chloride   Intravenous Once   carvedilol  3.125 mg Oral BID WC   Chlorhexidine Gluconate Cloth  6 each Topical Q0600   Chlorhexidine Gluconate Cloth  6 each Topical  Q0600   diclofenac Sodium  2 g Topical QID   diltiazem  360 mg Oral Daily   doxercalciferol  2 mcg Intravenous Q T,Th,Sa-HD   feeding supplement  237 mL Oral Once   fluticasone  2 spray Each Nare Daily   gabapentin  100 mg Oral Q T,Th,Sat-1800   hydrALAZINE  25 mg Oral Q6H   multivitamin  1 tablet Oral QHS   pantoprazole  40 mg Oral BID   peg 3350 powder  0.5 kit Oral Once   sevelamer carbonate  1,600 mg Oral TID WC   traZODone  50 mg Oral QHS   Continuous Infusions:   LOS: 7 days    Time spent: over 30 min    Fayrene Helper, MD [facility]   To contact the attending provider between 7A-7P or the covering provider during after hours 7P-7A, please log into the web site [email or web address] and access using universal [place] password for that web site. If you do not have the password, please call the hospital operator.  07/11/2022, 5:59 PM

## 2022-07-11 NOTE — Progress Notes (Signed)
Received patient in bed to unit.  Alert and oriented.  Informed consent signed and in chart.   Treatment initiated: 0831 Treatment completed: 1238  Patient tolerated well.  Transported back to the room  Alert, without acute distress.  Hand-off given to patient's nurse.   Access used:fistula left arm Access issues: none Total UF removed: 3.5 liters Medication(s) given: hectorol Post HD VS: 179/94 MAP117 HR 82 RR 15 Sat 97% on room air  Post HD weight: 74 kg bed wt    [name]

## 2022-07-11 NOTE — Progress Notes (Signed)
[facility] Progress Note   Assessment/ Plan:   OP HD: [facility]  3h 78mn  67kg  2/2 bath  450/1.5   P4  Hep none    LUA AVF (using upper aspects only per pt, recovering from surgery) - hectorol 2 ug iV tiw - venofer 522mweekly (just completed IV fe load on 9/12) - mircera 225 q2, last 9/12, due 9/26   Assessment/Plan: Recurrent GI bleed - Hb 6.6 on admit, now stable at 8.4. EGD showed probable embolization coils eroding thru the duodenum but not bleeding. Some oozing prox to ampulla was biopsied and clipped. On IV PPI.  had multiple EGD's and interventions including two upper GI embolizations by IR in Jan and Feb 2023. Management per GI.  For repeat EGD and c-scope tomorrow  ESRD - on HD TTS.  Tolerating HD well.  No heparin.  Next HD  9/26.   HTN/ vol - hyponatremic. Above his outpatient EDW, may have some volume excess from liquid diet and blood transfusions. Tolerated 3.5L UF Saturday, continue increased UF goals Anemia esrd + abl - S/p 3 units PRBC, see above. Esa 9/26.  MBD ckd - CCa in  range, phos controlled. Cont IV vdra w HD, and renvela as binder ac tid.  Hx HCV - eradicated, undetectable virus Sept 2022.   Subjective:   Seen on dialysis.  Not very talkative today..   Objective:   BP (!) 193/103   Pulse 70   Temp 97.6 F (36.4 C) (Oral)   Resp 16   Ht '6\' 1"'  (1.854 m)   Wt 78 kg   SpO2 100%   BMI 22.69 kg/m   Physical Exam: Gen:NAD, lying in bed CVS:RRR Resp: clear AbQZE:SPQZxt: no LE edema ACCESS: AVF + T/B  Labs: BMET Recent Labs  Lab 07/05/22 1035 07/05/22 1853 07/06/22 0738 07/08/22 0802 07/10/22 0336 07/11/22 0016  NA 129* 130* 127* 125* 131* 129*  K 3.9 4.3 4.3 4.8 5.1 5.2*  CL 94* 90* 89* 91* 93* 95*  CO2  --  '22 23 23 25 22  ' GLUCOSE 77 68* 66* 161* 83 93  BUN 39* 46* 50* 43* 47* 62*  CREATININE 7.40* 7.32* 8.04* 7.75* 7.53* 8.53*  CALCIUM  --  8.3* 8.2* 7.8* 8.9 8.4*  PHOS  --  6.2*  --  5.2*  --  4.6   CBC Recent Labs   Lab 07/04/22 1351 07/05/22 1035 07/06/22 0738 07/08/22 0801 07/08/22 1607 07/09/22 1600 07/10/22 0336 07/10/22 1233 07/11/22 0016  WBC 6.9   < > 5.9 5.6  --   --  5.6  --  6.2  NEUTROABS 5.7  --   --   --   --   --  4.1  --  4.5  HGB 9.1*   < > 8.5* 8.0*   < > 8.0* 7.7* 7.5* 7.4*  HCT 27.6*   < > 26.0* 24.4*   < > 25.7* 24.3* 23.5* 22.6*  MCV 89.3   < > 90.0 90.4  --   --  92.4  --  90.0  PLT 243   < > 244 225  --   --  238  --  234   < > = values in this interval not displayed.      Medications:     sodium chloride   Intravenous Once   carvedilol  3.125 mg Oral BID WC   Chlorhexidine Gluconate Cloth  6 each Topical Q0600   Chlorhexidine Gluconate Cloth  6 each Topical Q0600   diclofenac Sodium  2 g Topical QID   diltiazem  360 mg Oral Daily   doxercalciferol  2 mcg Intravenous Q T,Th,Sa-HD   feeding supplement  237 mL Oral Once   fluticasone  2 spray Each Nare Daily   gabapentin  100 mg Oral Q T,Th,Sat-1800   hydrALAZINE  25 mg Oral Q6H   multivitamin  1 tablet Oral QHS   pantoprazole  40 mg Oral BID   peg 3350 powder  0.5 kit Oral Once   And   peg 3350 powder  0.5 kit Oral Once   polyethylene glycol  17 g Oral BID   sevelamer carbonate  1,600 mg Oral TID WC   traZODone  50 mg Oral QHS     Madelon Lips, MD 07/11/2022, 10:39 AM

## 2022-07-11 NOTE — Procedures (Signed)
Patient seen and examined on Hemodialysis. BP (!) 193/103   Pulse 70   Temp 97.6 F (36.4 C) (Oral)   Resp 16   Ht '6\' 1"'$  (1.854 m)   Wt 78 kg   SpO2 100%   BMI 22.69 kg/m   QB 400 mL/ min via AVF, UF goal 3L  Tolerating treatment without complaints at this time.   Madelon Lips MD [facility] [phone] 10:42 AM

## 2022-07-12 ENCOUNTER — Encounter (HOSPITAL_COMMUNITY)
Admission: EM | Disposition: A | Discharge status: Home / Self Care | Payer: Self-pay | Source: Home / Self Care | Attending: Family Medicine

## 2022-07-12 ENCOUNTER — Encounter (HOSPITAL_COMMUNITY): Payer: Self-pay | Admitting: Family Medicine

## 2022-07-12 ENCOUNTER — Inpatient Hospital Stay (HOSPITAL_COMMUNITY): Payer: Medicare Other | Admitting: Certified Registered"

## 2022-07-12 ENCOUNTER — Other Ambulatory Visit (HOSPITAL_COMMUNITY): Payer: Self-pay

## 2022-07-12 DIAGNOSIS — K633 Ulcer of intestine: Secondary | ICD-10-CM

## 2022-07-12 DIAGNOSIS — J45909 Unspecified asthma, uncomplicated: Secondary | ICD-10-CM

## 2022-07-12 DIAGNOSIS — D500 Iron deficiency anemia secondary to blood loss (chronic): Secondary | ICD-10-CM

## 2022-07-12 DIAGNOSIS — T183XXA Foreign body in small intestine, initial encounter: Secondary | ICD-10-CM

## 2022-07-12 DIAGNOSIS — Q438 Other specified congenital malformations of intestine: Secondary | ICD-10-CM

## 2022-07-12 DIAGNOSIS — Z87891 Personal history of nicotine dependence: Secondary | ICD-10-CM

## 2022-07-12 DIAGNOSIS — K6389 Other specified diseases of intestine: Secondary | ICD-10-CM

## 2022-07-12 HISTORY — PX: COLONOSCOPY: SHX5424

## 2022-07-12 HISTORY — PX: ESOPHAGOGASTRODUODENOSCOPY (EGD) WITH PROPOFOL: SHX5813

## 2022-07-12 LAB — COMPREHENSIVE METABOLIC PANEL
ALT: 13 U/L (ref 0–44)
AST: 20 U/L (ref 15–41)
Albumin: 3.1 g/dL — ABNORMAL LOW (ref 3.5–5.0)
Alkaline Phosphatase: 96 U/L (ref 38–126)
Anion gap: 14 (ref 5–15)
BUN: 34 mg/dL — ABNORMAL HIGH (ref 8–23)
CO2: 27 mmol/L (ref 22–32)
Calcium: 8.5 mg/dL — ABNORMAL LOW (ref 8.9–10.3)
Chloride: 93 mmol/L — ABNORMAL LOW (ref 98–111)
Creatinine, Ser: 5.5 mg/dL — ABNORMAL HIGH (ref 0.61–1.24)
GFR, Estimated: 11 mL/min — ABNORMAL LOW (ref >60)
Glucose, Bld: 86 mg/dL (ref 70–99)
Potassium: 4.5 mmol/L (ref 3.5–5.1)
Sodium: 134 mmol/L — ABNORMAL LOW (ref 135–145)
Total Bilirubin: 0.9 mg/dL (ref 0.3–1.2)
Total Protein: 7 g/dL (ref 6.5–8.1)

## 2022-07-12 LAB — CBC WITH DIFFERENTIAL/PLATELET
Abs Immature Granulocytes: 0.03 10*3/uL (ref 0.00–0.07)
Basophils Absolute: 0 10*3/uL (ref 0.0–0.1)
Basophils Relative: 1 %
Eosinophils Absolute: 0.1 10*3/uL (ref 0.0–0.5)
Eosinophils Relative: 2 %
HCT: 23.5 % — ABNORMAL LOW (ref 39.0–52.0)
Hemoglobin: 7.4 g/dL — ABNORMAL LOW (ref 13.0–17.0)
Immature Granulocytes: 1 %
Lymphocytes Relative: 10 %
Lymphs Abs: 0.5 10*3/uL — ABNORMAL LOW (ref 0.7–4.0)
MCH: 28.8 pg (ref 26.0–34.0)
MCHC: 31.5 g/dL (ref 30.0–36.0)
MCV: 91.4 fL (ref 80.0–100.0)
Monocytes Absolute: 0.8 10*3/uL (ref 0.1–1.0)
Monocytes Relative: 15 %
Neutro Abs: 4 10*3/uL (ref 1.7–7.7)
Neutrophils Relative %: 71 %
Platelets: 260 10*3/uL (ref 150–400)
RBC: 2.57 MIL/uL — ABNORMAL LOW (ref 4.22–5.81)
RDW: 17.4 % — ABNORMAL HIGH (ref 11.5–15.5)
WBC: 5.5 10*3/uL (ref 4.0–10.5)
nRBC: 0 % (ref 0.0–0.2)

## 2022-07-12 LAB — MAGNESIUM: Magnesium: 1.8 mg/dL (ref 1.7–2.4)

## 2022-07-12 LAB — PHOSPHORUS: Phosphorus: 4 mg/dL (ref 2.5–4.6)

## 2022-07-12 SURGERY — ESOPHAGOGASTRODUODENOSCOPY (EGD) WITH PROPOFOL
Anesthesia: Monitor Anesthesia Care

## 2022-07-12 SURGERY — COLONOSCOPY WITH PROPOFOL
Anesthesia: Monitor Anesthesia Care

## 2022-07-12 MED ORDER — PROPOFOL 10 MG/ML IV BOLUS
INTRAVENOUS | Status: DC | PRN
  Administered 2022-07-12: 50 mg via INTRAVENOUS
  Administered 2022-07-12: 20 mg via INTRAVENOUS

## 2022-07-12 MED ORDER — LIDOCAINE HCL (CARDIAC) PF 100 MG/5ML IV SOSY
PREFILLED_SYRINGE | INTRAVENOUS | Status: DC | PRN
  Administered 2022-07-12: 50 mg via INTRAVENOUS

## 2022-07-12 MED ORDER — PANTOPRAZOLE SODIUM 40 MG PO TBEC
40.0000 mg | DELAYED_RELEASE_TABLET | Freq: Two times a day (BID) | ORAL | 0 refills | Status: DC
  Filled 2022-07-12: qty 60, 30d supply, fill #0

## 2022-07-12 MED ORDER — GABAPENTIN 100 MG PO CAPS
100.0000 mg | ORAL_CAPSULE | ORAL | 0 refills | Status: DC
  Filled 2022-07-12: qty 30, 70d supply, fill #0

## 2022-07-12 MED ORDER — PROPOFOL 500 MG/50ML IV EMUL
INTRAVENOUS | Status: DC | PRN
  Administered 2022-07-12: 150 ug/kg/min via INTRAVENOUS

## 2022-07-12 MED ORDER — SODIUM CHLORIDE 0.9 % IV SOLN
INTRAVENOUS | Status: AC | PRN
  Administered 2022-07-12: 500 mL via INTRAVENOUS

## 2022-07-12 MED ORDER — BUTAMBEN-TETRACAINE-BENZOCAINE 2-2-14 % EX AERO
INHALATION_SPRAY | CUTANEOUS | Status: DC | PRN
  Administered 2022-07-12: 1 via TOPICAL

## 2022-07-12 MED ORDER — DEXMEDETOMIDINE HCL IN NACL 80 MCG/20ML IV SOLN
INTRAVENOUS | Status: DC | PRN
  Administered 2022-07-12 (×3): 4 ug via BUCCAL
  Administered 2022-07-12: 8 ug via BUCCAL

## 2022-07-12 SURGICAL SUPPLY — 15 items

## 2022-07-12 NOTE — Progress Notes (Signed)
[facility] Progress Note   Assessment/ Plan:   OP HD: [facility]  3h 32mn  67kg  2/2 bath  450/1.5   P4  Hep none    LUA AVF (using upper aspects only per pt, recovering from surgery) - hectorol 2 ug iV tiw - venofer '50mg'$  weekly (just completed IV fe load on 9/12) - mircera 225 q2, last 9/12, due 9/26   Assessment/Plan: Recurrent GI bleed - Hb 6.6 on admit, now stable at 8.4. EGD showed probable embolization coils eroding thru the duodenum but not bleeding. Some oozing prox to ampulla was biopsied and clipped. On IV PPI.  had multiple EGD's and interventions including two upper GI embolizations by IR in Jan and Feb 2023. Management per GI.  EGD and c-scope with no active bleeding.   ESRD - on HD TTS.  Tolerating HD well.  No heparin.  Next HD  9/28 as OP  HTN/ vol - hyponatremic. Above his outpatient EDW, may have some volume excess from liquid diet and blood transfusions. Tolerated 3.5L UF Saturday, continue increased UF goals Anemia esrd + abl - S/p 3 units PRBC, see above. Esa 9/26.  MBD ckd - CCa in  range, phos controlled. Cont IV vdra w HD, and renvela as binder ac tid.  Hx HCV - eradicated, undetectable virus Sept 2022.   Subjective:    S/p repeat EGD and c-scope today.  No active bleeding.  On PPI BID/.     Objective:   BP (!) 175/109 (BP Location: Right Arm)   Pulse 68   Temp (!) 97 F (36.1 C)   Resp 15   Ht '6\' 1"'$  (1.854 m)   Wt 74 kg   SpO2 94%   BMI 21.52 kg/m   Physical Exam: Gen:NAD, lying in bed CVS:RRR Resp: clear AZHY:QMVHExt: no LE edema ACCESS: AVF + T/B  Labs: BMET Recent Labs  Lab 07/05/22 1853 07/06/22 0738 07/08/22 0802 07/10/22 0336 07/11/22 0016 07/12/22 0029  NA 130* 127* 125* 131* 129* 134*  K 4.3 4.3 4.8 5.1 5.2* 4.5  CL 90* 89* 91* 93* 95* 93*  CO2 '22 23 23 25 22 27  '$ GLUCOSE 68* 66* 161* 83 93 86  BUN 46* 50* 43* 47* 62* 34*  CREATININE 7.32* 8.04* 7.75* 7.53* 8.53* 5.50*  CALCIUM 8.3* 8.2* 7.8* 8.9 8.4* 8.5*   PHOS 6.2*  --  5.2*  --  4.6 4.0   CBC Recent Labs  Lab 07/08/22 0801 07/08/22 1607 07/10/22 0336 07/10/22 1233 07/11/22 0016 07/12/22 0029  WBC 5.6  --  5.6  --  6.2 5.5  NEUTROABS  --   --  4.1  --  4.5 4.0  HGB 8.0*   < > 7.7* 7.5* 7.4* 7.4*  HCT 24.4*   < > 24.3* 23.5* 22.6* 23.5*  MCV 90.4  --  92.4  --  90.0 91.4  PLT 225  --  238  --  234 260   < > = values in this interval not displayed.      Medications:     sodium chloride   Intravenous Once   carvedilol  3.125 mg Oral BID WC   Chlorhexidine Gluconate Cloth  6 each Topical Q0600   Chlorhexidine Gluconate Cloth  6 each Topical Q0600   diclofenac Sodium  2 g Topical QID   diltiazem  360 mg Oral Daily   doxercalciferol  2 mcg Intravenous Q T,Th,Sa-HD   feeding supplement  237 mL Oral Once  fluticasone  2 spray Each Nare Daily   gabapentin  100 mg Oral Q T,Th,Sat-1800   hydrALAZINE  25 mg Oral Q6H   multivitamin  1 tablet Oral QHS   pantoprazole  40 mg Oral BID   sevelamer carbonate  1,600 mg Oral TID WC   traZODone  50 mg Oral QHS     Madelon Lips, MD 07/12/2022, 2:54 PM

## 2022-07-12 NOTE — Anesthesia Preprocedure Evaluation (Addendum)
Anesthesia Evaluation  Patient identified by MRN, date of birth, ID band Patient awake    Reviewed: Allergy & Precautions, NPO status , Patient's Chart, lab work & pertinent test results  Airway Mallampati: II  TM Distance: >3 FB Neck ROM: Full    Dental no notable dental hx.    Pulmonary asthma , Patient abstained from smoking., former smoker,    Pulmonary exam normal        Cardiovascular hypertension, Pt. on medications +CHF  Normal cardiovascular exam  ECHO: Left ventricular ejection fraction, by estimation, is 50 to 55%. The left ventricle has low normal function. The left ventricle has no regional wall motion abnormalities. The left ventricular internal cavity size was  mildly dilated. There is moderate  left ventricular hypertrophy. Left ventricular diastolic parameters are consistent with Grade II diastolic dysfunction (pseudonormalization).  Elevated left atrial pressure.  Right ventricular systolic function is mildly reduced. The right ventricular size is mildly enlarged. There is moderately elevated  pulmonary artery systolic pressure. The estimated right ventricular systolic pressure is 28.7 mmHg.  Left atrial size was moderately dilated.  Right atrial size was mildly dilated.  The mitral valve is normal in structure. Mild mitral valve  regurgitation. No evidence of mitral stenosis.  Tricuspid valve regurgitation is moderate to severe.  The aortic valve is tricuspid. Aortic valve regurgitation is trivial.  Aortic valve sclerosis/calcification is present, without any evidence of aortic stenosis.  Aortic dilatation noted. There is mild dilatation of the ascending aorta, measuring 38 mm.  The inferior vena cava is dilated in size with <50% respiratory variability, suggesting right atrial pressure of 15 mmHg.    Neuro/Psych  Headaches, negative psych ROS   GI/Hepatic negative GI ROS, (+)     substance abuse  ,  Hepatitis -  Endo/Other  negative endocrine ROS  Renal/GU Dialysis and ESRFRenal disease     Musculoskeletal negative musculoskeletal ROS (+)   Abdominal   Peds  Hematology  (+) Blood dyscrasia, anemia ,   Anesthesia Other Findings GI bleeding  Melena  Reproductive/Obstetrics                            Anesthesia Physical Anesthesia Plan  ASA: 3  Anesthesia Plan: MAC   Post-op Pain Management:    Induction: Intravenous  PONV Risk Score and Plan: 1 and Propofol infusion and Treatment may vary due to age or medical condition  Airway Management Planned: Nasal Cannula  Additional Equipment:   Intra-op Plan:   Post-operative Plan:   Informed Consent: I have reviewed the patients History and Physical, chart, labs and discussed the procedure including the risks, benefits and alternatives for the proposed anesthesia with the patient or authorized representative who has indicated his/her understanding and acceptance.     Dental advisory given  Plan Discussed with: CRNA  Anesthesia Plan Comments:        Anesthesia Quick Evaluation

## 2022-07-12 NOTE — Interval H&P Note (Signed)
History and Physical Interval Note:  07/12/2022 9:08 AM  Timothy [name]  has presented today for surgery, with the diagnosis of GI bleeding , melena.  The various methods of treatment have been discussed with the patient and family. After consideration of risks, benefits and other options for treatment, the patient has consented to  Procedure(s): ESOPHAGOGASTRODUODENOSCOPY (EGD) WITH PROPOFOL (N/A) COLONOSCOPY (N/A) as a surgical intervention.  The patient's history has been reviewed, patient examined, no change in status, stable for surgery.  I have reviewed the patient's chart and labs.  Questions were answered to the patient's satisfaction.    Hgb 7.4 this morning  Nelida Meuse III

## 2022-07-12 NOTE — Transfer of Care (Signed)
Immediate Anesthesia Transfer of Care Note  Patient: Timothy [name]  Procedure(s) Performed: ESOPHAGOGASTRODUODENOSCOPY (EGD) WITH PROPOFOL COLONOSCOPY  Patient Location: PACU  Anesthesia Type:MAC  Level of Consciousness: drowsy  Airway & Oxygen Therapy: Patient Spontanous Breathing and Patient connected to face mask oxygen  Post-op Assessment: Report given to RN and Post -op Vital signs reviewed and stable  Post vital signs: Reviewed and stable  Last Vitals:  Vitals Value Taken Time  BP 131/72 07/12/22 1100  Temp    Pulse 70 07/12/22 1104  Resp 30 07/12/22 1104  SpO2 97 % 07/12/22 1104  Vitals shown include unvalidated device data.  Last Pain:  Vitals:   07/12/22 0847  TempSrc: Temporal  PainSc: 0-No pain      Patients Stated Pain Goal: 0 (43/27/61 [id number])  Complications: No notable events documented.

## 2022-07-12 NOTE — Plan of Care (Signed)

## 2022-07-12 NOTE — Anesthesia Postprocedure Evaluation (Signed)
Anesthesia Post Note  Patient: Timothy [name]  Procedure(s) Performed: ESOPHAGOGASTRODUODENOSCOPY (EGD) WITH PROPOFOL COLONOSCOPY     Patient location during evaluation: Endoscopy Anesthesia Type: MAC Level of consciousness: awake Pain management: pain level controlled Vital Signs Assessment: post-procedure vital signs reviewed and stable Respiratory status: spontaneous breathing, nonlabored ventilation, respiratory function stable and patient connected to nasal cannula oxygen Cardiovascular status: stable and blood pressure returned to baseline Postop Assessment: no apparent nausea or vomiting Anesthetic complications: no   No notable events documented.  Last Vitals:  Vitals:   07/12/22 1219 07/12/22 1358  BP: (!) 177/117 (!) 175/109  Pulse: 68   Resp: 15   Temp:    SpO2:      Last Pain:  Vitals:   07/12/22 1135  TempSrc:   PainSc: 0-No pain                 Nazifa Trinka P Sira Adsit

## 2022-07-12 NOTE — Discharge Summary (Signed)
Discharge Summary  Timothy [name] KXF:[id number] DOB: Mar 07, 1961  PCP: [name], MD  Admit date: 07/03/2022 Discharge date: 07/12/2022  30 Day Unplanned Readmission Risk Score    Flowsheet Row ED to Hosp-Admission (Current) from 07/03/2022 in [place] [facility]  30 Day Unplanned Readmission Risk Score (%) 54.88 Filed at 07/12/2022 1200       This score is the patient's risk of an unplanned readmission within 30 days of being discharged (0 -100%). The score is based on dignosis, age, lab data, medications, orders, and past utilization.   Low:  0-14.9   Medium: 15-21.9   High: 22-29.9   Extreme: 30 and above         Time spent: 39mns, more than 50% time spent on coordination of care.   Recommendations for Outpatient Follow-up:  F/u with PCP within a week  for hospital discharge follow up, repeat cbc/bmp at follow up F/u with GI, need to repeat colonoscopy in three years Continue HD TTS    Discharge Diagnoses:  Active Hospital Problems   Diagnosis Date Noted   Acute GI bleeding 07/03/2022    Priority: 1.   ESRD on HD TTS 11/01/2021    Priority: 2.   Hyponatremia     Priority: 3.   [place]mal [place]fib ([facility] 11/30/2021    Priority: 4.   HTN (hypertension) 07/17/2016    Priority: 5.   Chronic combined systolic and diastolic CHF (congestive heart failure) ([facility] 10/05/2021    Priority: 6.   History of colon cancer     Priority: 7.   historyof treated hep C  03/15/2022    Priority: 8.   [place] ([facility] 07/03/2022    Priority: 9.   Melena    Anemia due to GI blood loss 09/22/2016    Resolved Hospital Problems   Diagnosis Date Noted Date Resolved   Aneurysm of thoracic aorta ([facility] 10/05/2021 07/03/2022    Priority: 10.    Discharge Condition: stable  Diet recommendation: RENAL DIET  Filed Weights   07/08/22 1157 07/11/22 0800 07/11/22 1328  Weight: 72.3 kg 78 kg 74 kg    History of present illness: ( per admitting MD Dr  WRogers Blocker Chief Complaint: low hgb   HPI: [name] a 61y.o. male with medical history significant of ESRD on dialysis TTS, treated hepatitis C, HTN, diastolic CHF, hx of polysubstance abuse, thoracic aortic aneurysm, hx of GI bleed and duodenal ulcer with IR embolization in 11/16/21 to bleeding duodenal ulcer who presented to ED for low hemoglobin. He had labs drawn at his dialysis center and was told to come to ED. He has no idea he was anemic, but states he has been tired lately x1 week and short of breath for 2 weeks. He denies any blood in stool, but states he noticed his stool get dark about 2 weeks ago. He has no N/V/D, no abdominal pain.    Denies any fever/chills, vision changes/headaches, chest pain or palpitations, abdominal pain, N/V/D. He also states he has had some swelling in his right that has been here since February.    He does smoke. 2-3 cigarettes/day. He does not drink alcohol. He does not drink excessive caffeine or use NSAIDs.    EGD history 11/05/21: EGD with large, bleeding duodenal ulcer treated with hemospray followed by GDA embolization by IR. Never took his PPI had recurrent bleeding. General surgery recommended exhausting all non surgical options as he is a poor operative risk.  11/14/21: EGD small  HH, antral erythema, nonbleeding duodenal ulcer with adherent clot.  11/16/21: EGD. Clotted blood seen in gastric body. Non obstructing duodenal ulcer with spurting blood tx'd with epi, 3 endoclips, bipolar cautery without success and tx'd with hemospray 11/16/21: embolization of anterior and pancreaticoduodenal arcades.   He has continued to not take protonix or carafate and states he's never had this medication.    ER Course:  vitals: afebrile, bp: 175/94, HR: 75, RR: 17, oxygen: 99%RA Pertinent labs: hgb: 6.6, sodium: 128, BUN: 57, creatinine: 7.66, fecal occult positive CXR: cardiomegaly. No acute finding In ED: started on protonix drip. Transfused 1 unit PRBC.  Nephrology and GI consulted. TRH asked to admit.     Hospital Course:  Principal Problem:   Acute GI bleeding Active Problems:   ESRD on HD TTS   Hyponatremia   Paroxysmal A-fib (H[facility])   HTN (hypertension)   Chronic combined systolic and diastolic CHF (congestive heart failure) ([place])   History of colon cancer   historyof treated hep C    Polysubstance abuse ([place])   Anemia due to GI blood loss   Melena   Assessment and Plan:  Acute GI bleed -CTA  GI bleed on presentation showed small focus of intraluminal contrast accumulation at the splenic flexure of the colon - c/w minimal active GI bleeding (no active GI bleeding within duodenum near site of recent hemostatic clip placement and prior arterial embolization) -reportedly nonadherent to PPI -Hb low of 6.6 on presentation, received prbcx3 units -s/p EGD and colonoscopy on 9/20, due to concerns of rebleeding,  EGD and colonoscopy repeated on 9/27, did not reveal any active bleeding, details please see original reports, GI cleared him to discharge on ppi BID , he need to repeat colonoscopy in three years per gi recommendation.    ESRD on HD TTS -- s/p Revision of left arm AV fistula with pseudoaneurysm plication and branch ligation on 05/26/22 by Dr. Donzetta Matters.   -appreciate nephrology input   Hyponatremia -Management per nephrology   Paroxysmal atrial fibrillation -Patient is in sinus rhythm, not on anticoagulation due to GI bleed -Continue Coreg, diltiazem   Hypertension -Continue Coreg, diltiazem, hydralazine   History of colon cancer -S/p colectomy in 2014 -Colonoscopy x2 during this hospitalization, recommend repeat colonoscopy in three yrs  Peripheral neuropathy Reports neurontin TTS after dialysis helped, prescription provided at discharge         Discharge Exam: BP (!) 177/117   Pulse 67   Temp (!) 97 F (36.1 C)   Resp (!) 27   Ht '6\' 1"'$  (1.854 m)   Wt 74 kg   SpO2 97%   BMI 21.52 kg/m   General: NAD,  ambulating with stable gait Cardiovascular: RRR Respiratory: normal respiratory effort     Discharge Instructions     Diet - low sodium heart healthy   Complete by: As directed    Renal diet   Increase activity slowly   Complete by: As directed       Allergies as of 07/12/2022   No Known Allergies      Medication List     STOP taking these medications    doxycycline 100 MG tablet Commonly known as: VIBRA-TABS       TAKE these medications    albuterol 108 (90 Base) MCG/ACT inhaler Commonly known as: VENTOLIN HFA Inhale 2 puffs into the lungs every 6 (six) hours as needed for wheezing or shortness of breath.   carvedilol 3.125 MG tablet Commonly known as: COREG Take  3.125 mg by mouth 2 (two) times daily with a meal.   diltiazem 360 MG 24 hr capsule Commonly known as: TIAZAC Take 360 mg by mouth in the morning.   DULoxetine 20 MG capsule Commonly known as: CYMBALTA Take 20 mg by mouth daily.   fluticasone 50 MCG/ACT nasal spray Commonly known as: FLONASE Place 2 sprays into both nostrils daily.   gabapentin 100 MG capsule Commonly known as: NEURONTIN Take 1 capsule (100 mg total) by mouth every Tuesday, Thursday, and Saturday at 6 PM. Start taking on: July 13, 2022   hydrALAZINE 25 MG tablet Commonly known as: APRESOLINE Take 1 tablet (25 mg total) by mouth every 6 (six) hours.   LIDOCAINE-PRILOCAINE EX Apply 1 application  topically Every Tuesday,Thursday,and Saturday with dialysis.   losartan 25 MG tablet Commonly known as: COZAAR Take 25 mg by mouth daily.   montelukast 10 MG tablet Commonly known as: SINGULAIR Take 10 mg by mouth at bedtime.   multivitamin Tabs tablet Take 1 tablet by mouth at bedtime.   oxyCODONE 5 MG immediate release tablet Commonly known as: Oxy IR/ROXICODONE Take 5 mg by mouth every 8 (eight) hours as needed for pain or severe pain.   pantoprazole 40 MG tablet Commonly known as: PROTONIX Take 1 tablet (40 mg  total) by mouth 2 (two) times daily.   sevelamer carbonate 800 MG tablet Commonly known as: RENVELA Take 1,600 mg by mouth 3 (three) times daily with meals.       No Known Allergies  Follow-up Information     [name], MD Follow up.   Specialty: Internal Medicine Why: hospital discharge follow up, repeat basic lab works including cbc/bmp at follow up Contact information: [place] [address] [phone]         Doran Stabler, MD Follow up.   Specialty: Gastroenterology Why: for blood in stool/GI bleeding repeat colonoscopy in three years Contact information: [place] [address] [phone]                  The results of significant diagnostics from this hospitalization (including imaging, microbiology, ancillary and laboratory) are listed below for reference.    Significant Diagnostic Studies: CT ANGIO GI BLEED  Result Date: 07/05/2022 CLINICAL DATA:  Lower gastrointestinal bleeding EXAM: CTA ABDOMEN AND PELVIS WITHOUT AND WITH CONTRAST TECHNIQUE: Multidetector CT imaging of the abdomen and pelvis was performed using the standard protocol during bolus administration of intravenous contrast. Multiplanar reconstructed images and MIPs were obtained and reviewed to evaluate the vascular anatomy. RADIATION DOSE REDUCTION: This exam was performed according to the departmental dose-optimization program which includes automated exposure control, adjustment of the mA and/or kV according to patient size and/or use of iterative reconstruction technique. CONTRAST:  67m OMNIPAQUE IOHEXOL 350 MG/ML SOLN COMPARISON:  02/14/2022 FINDINGS: VASCULAR Aorta: Normal caliber aorta without aneurysm, dissection, vasculitis or significant stenosis. Diffuse atherosclerosis. Celiac: Patent without evidence of aneurysm, dissection, vasculitis or significant stenosis. Embolic coils in the distribution of the gastroduodenal artery. SMA:  Patent without evidence of aneurysm, dissection, vasculitis or significant stenosis. Renals: Both renal arteries are patent. Atherosclerosis bilaterally, left greater than right. Stenosis approaching 50% within the proximal left renal artery. Less than 50% stenosis on the right. No aneurysm, dissection, or vasculitis. IMA: Patent without evidence of aneurysm, dissection, vasculitis or significant stenosis. Inflow: Patent without evidence of aneurysm, dissection, vasculitis or significant stenosis. Diffuse atherosclerosis. Proximal Outflow: Bilateral common femoral and visualized  portions of the superficial and profunda femoral arteries are patent without evidence of aneurysm, dissection, vasculitis or significant stenosis. Diffuse atherosclerosis. Veins: No obvious venous abnormality within the limitations of this arterial phase study. Review of the MIP images confirms the above findings. NON-VASCULAR Lower chest: No acute pleural or parenchymal lung disease. The heart is enlarged without pericardial effusion. Hepatobiliary: Cholelithiasis without cholecystitis. The liver is unremarkable. No biliary duct dilation. Pancreas: Unremarkable. No pancreatic ductal dilatation or surrounding inflammatory changes. Spleen: 2.2 cm splenic cyst.  Spleen is not enlarged. Adrenals/Urinary Tract: Innumerable bilateral renal cysts and underlying cortical atrophy consistent with end-stage renal disease. There is a 3.9 x 3.6 cm heterogeneous lesion within the right kidney, measuring 43 HU on precontrast imaging, 40 HU on arterial phase imaging, and 47 HU on delayed imaging. While this has increased in size since 2022, the lack of significant enhancement and previous MR imaging characteristics are most consistent with proteinaceous or hemorrhagic cyst. The adrenals and bladder are grossly unremarkable. Stomach/Bowel: No bowel obstruction or ileus. Clips are seen within the second portion duodenum from prior endoscopy. Postsurgical  changes from right hemicolectomy and reanastomosis. There is minimal intraluminal contrast accumulation only seen on delayed imaging at the splenic flexure of the colon, reference image 29/15 and image 88/20, consistent with small area of acute gastrointestinal bleeding. No bowel wall thickening or inflammatory change. Lymphatic: No pathologic adenopathy within the abdomen or pelvis. Reproductive: Prostate is unremarkable. Other: Moderate ascites throughout the upper abdomen, with multiple synechiae throughout the peritoneal fluid. No free intraperitoneal gas. No abdominal wall hernia. Musculoskeletal: No acute or destructive bony lesions. Reconstructed images demonstrate no additional findings. IMPRESSION: VASCULAR 1. 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 2. No evidence of active gastrointestinal bleeding within the duodenum near the site of recent hemostatic clip placement and prior arterial embolization. 3.  Aortic Atherosclerosis (ICD10-I70.0). NON-VASCULAR 1. Moderate volume complex ascites. 2. Cholelithiasis without cholecystitis. 3. Enlarging nonenhancing hyperdense lesion right kidney, previously shown to represent a proteinaceous or hemorrhagic cyst. 4. Cardiomegaly. Critical Value/emergent results were called by telephone at the time of interpretation on 07/05/2022 at 8:47 pm to provider [facility], who verbally acknowledged these results. Based on endoscopy performed earlier today, the clinical concern was for upper gastrointestinal bleeding near the site of duodenal hemostatic clip placement. The small hemorrhage at the splenic flexure may reflect incidental hemorrhage due to angio dysplasia. Electronically Signed   By: Randa Ngo M.D.   On: 07/05/2022 20:57   DG Chest 2 View  Result Date: 07/03/2022 CLINICAL DATA:   Shortness of breath EXAM: CHEST - 2 VIEW COMPARISON:  Previous studies including the examination of 04/07/2022 FINDINGS: Transverse diameter of heart is increased. Central pulmonary vessels are prominent. There are no signs of alveolar pulmonary edema. Linear densities in right lower lung fields suggest scarring. There is no new focal pulmonary consolidation. There is no pleural effusion or pneumothorax. IMPRESSION: Cardiomegaly. Central pulmonary vessels are prominent without signs of pulmonary edema. Linear densities in right lower lung field have not changed, possibly scarring. No new focal pulmonary consolidation is seen. Electronically Signed   By: Elmer Picker M.D.   On: 07/03/2022 13:26    Microbiology: Recent Results (from the past 240 hour(s))  MRSA Next Gen by PCR, Nasal     Status: None   Collection Time: 07/03/22  7:38  PM   Specimen: Nasal Mucosa; Nasal Swab  Result Value Ref Range Status   MRSA by PCR Next Gen NOT DETECTED NOT DETECTED Final    Comment: (NOTE) The GeneXpert MRSA Assay (FDA approved for NASAL specimens only), is one component of a comprehensive MRSA colonization surveillance program. It is not intended to diagnose MRSA infection nor to guide or monitor treatment for MRSA infections. Test performance is not FDA approved in patients less than 51 years old. Performed at [facility], [place] [address], [place], [place] [address]      Labs: Basic Metabolic Panel: Recent Labs  Lab 07/05/22 1853 07/06/22 9924 07/08/22 0802 07/10/22 0336 07/11/22 0016 07/12/22 0029  NA 130* 127* 125* 131* 129* 134*  K 4.3 4.3 4.8 5.1 5.2* 4.5  CL 90* 89* 91* 93* 95* 93*  CO2 '22 23 23 25 22 27  '$ GLUCOSE 68* 66* 161* 83 93 86  BUN 46* 50* 43* 47* 62* 34*  CREATININE 7.32* 8.04* 7.75* 7.53* 8.53* 5.50*  CALCIUM 8.3* 8.2* 7.8* 8.9 8.4* 8.5*  MG 2.2  --   --   --  2.1 1.8  PHOS 6.2*  --  5.2*  --  4.6 4.0   Liver Function Tests: Recent Labs  Lab 07/05/22 1853  07/08/22 0802 07/11/22 0016 07/12/22 0029  AST  --   --  19 20  ALT  --   --  11 13  ALKPHOS  --   --  88 96  BILITOT  --   --  0.9 0.9  PROT  --   --  7.1 7.0  ALBUMIN 2.9* 3.0* 3.2* 3.1*   No results for input(s): "LIPASE", "AMYLASE" in the last 168 hours. No results for input(s): "AMMONIA" in the last 168 hours. CBC: Recent Labs  Lab 07/06/22 0738 07/08/22 0801 07/08/22 1607 07/09/22 1600 07/10/22 0336 07/10/22 1233 07/11/22 0016 07/12/22 0029  WBC 5.9 5.6  --   --  5.6  --  6.2 5.5  NEUTROABS  --   --   --   --  4.1  --  4.5 4.0  HGB 8.5* 8.0*   < > 8.0* 7.7* 7.5* 7.4* 7.4*  HCT 26.0* 24.4*   < > 25.7* 24.3* 23.5* 22.6* 23.5*  MCV 90.0 90.4  --   --  92.4  --  90.0 91.4  PLT 244 225  --   --  238  --  234 260   < > = values in this interval not displayed.   Cardiac Enzymes: No results for input(s): "CKTOTAL", "CKMB", "CKMBINDEX", "TROPONINI" in the last 168 hours. BNP: BNP (last 3 results) Recent Labs    12/30/21 1110 02/13/22 1646 04/06/22 1720  BNP >4,500.0* >4,500.0* >4,500.0*    ProBNP (last 3 results) No results for input(s): "PROBNP" in the last 8760 hours.  CBG: No results for input(s): "GLUCAP" in the last 168 hours.  FURTHER DISCHARGE INSTRUCTIONS:   Get Medicines reviewed and adjusted: Please take all your medications with you for your next visit with your Primary MD   Laboratory/radiological data: Please request your Primary MD to go over all hospital tests and procedure/radiological results at the follow up, please ask your Primary MD to get all Hospital records sent to his/her office.   In some cases, they will be blood work, cultures and biopsy results pending at the time of your discharge. Please request that your primary care M.D. goes through all the records of your hospital data and follows  up on these results.   Also Note the following: If you experience worsening of your admission symptoms, develop shortness of breath, life  threatening emergency, suicidal or homicidal thoughts you must seek medical attention immediately by calling 911 or calling your MD immediately  if symptoms less severe.   You must read complete instructions/literature along with all the possible adverse reactions/side effects for all the Medicines you take and that have been prescribed to you. Take any new Medicines after you have completely understood and accpet all the possible adverse reactions/side effects.    Do not drive when taking Pain medications or sleeping medications (Benzodaizepines)   Do not take more than prescribed Pain, Sleep and Anxiety Medications. It is not advisable to combine anxiety,sleep and pain medications without talking with your primary care practitioner   Special Instructions: If you have smoked or chewed Tobacco  in the last 2 yrs please stop smoking, stop any regular Alcohol  and or any Recreational drug use.   Wear Seat belts while driving.   Please note: You were cared for by a hospitalist during your hospital stay. Once you are discharged, your primary care physician will handle any further medical issues. Please note that NO REFILLS for any discharge medications will be authorized once you are discharged, as it is imperative that you return to your primary care physician (or establish a relationship with a primary care physician if you do not have one) for your post hospital discharge needs so that they can reassess your need for medications and monitor your lab values.     Signed:  Florencia Reasons MD, PhD, FACP  [facility] 07/12/2022, 1:13 PM

## 2022-07-12 NOTE — Progress Notes (Signed)
D/C order noted. Contacted [address] to advise clinic of pt's d/c today and that pt will resume care tomorrow.   [name] Renal Navigator [phone]

## 2022-07-12 NOTE — Plan of Care (Signed)
  Problem: Education: Goal: Knowledge of General Education information will improve Description: Including pain rating scale, medication(s)/side effects and non-pharmacologic comfort measures 07/12/2022 1449 by Lucilla Lame, RN Outcome: Adequate for Discharge 07/12/2022 1449 by Lucilla Lame, RN Outcome: Progressing   Problem: Health Behavior/Discharge Planning: Goal: Ability to manage health-related needs will improve 07/12/2022 1449 by Lucilla Lame, RN Outcome: Adequate for Discharge 07/12/2022 1449 by Lucilla Lame, RN Outcome: Progressing   Problem: Clinical Measurements: Goal: Ability to maintain clinical measurements within normal limits will improve 07/12/2022 1449 by Lucilla Lame, RN Outcome: Adequate for Discharge 07/12/2022 1449 by Lucilla Lame, RN Outcome: Progressing Goal: Will remain free from infection 07/12/2022 1449 by Lucilla Lame, RN Outcome: Adequate for Discharge 07/12/2022 1449 by Lucilla Lame, RN Outcome: Progressing Goal: Diagnostic test results will improve 07/12/2022 1449 by Lucilla Lame, RN Outcome: Adequate for Discharge 07/12/2022 1449 by Lucilla Lame, RN Outcome: Progressing Goal: Respiratory complications will improve 07/12/2022 1449 by Lucilla Lame, RN Outcome: Adequate for Discharge 07/12/2022 1449 by Lucilla Lame, RN Outcome: Progressing Goal: Cardiovascular complication will be avoided 07/12/2022 1449 by Lucilla Lame, RN Outcome: Adequate for Discharge 07/12/2022 1449 by Lucilla Lame, RN Outcome: Progressing   Problem: Activity: Goal: Risk for activity intolerance will decrease 07/12/2022 1449 by Lucilla Lame, RN Outcome: Adequate for Discharge 07/12/2022 1449 by Lucilla Lame, RN Outcome: Progressing   Problem: Nutrition: Goal: Adequate nutrition will be maintained 07/12/2022 1449 by Lucilla Lame, RN Outcome: Adequate for Discharge 07/12/2022 1449 by Lucilla Lame, RN Outcome: Progressing    Problem: Coping: Goal: Level of anxiety will decrease 07/12/2022 1449 by Lucilla Lame, RN Outcome: Adequate for Discharge 07/12/2022 1449 by Lucilla Lame, RN Outcome: Progressing   Problem: Elimination: Goal: Will not experience complications related to bowel motility 07/12/2022 1449 by Lucilla Lame, RN Outcome: Adequate for Discharge 07/12/2022 1449 by Lucilla Lame, RN Outcome: Progressing Goal: Will not experience complications related to urinary retention 07/12/2022 1449 by Lucilla Lame, RN Outcome: Adequate for Discharge 07/12/2022 1449 by Lucilla Lame, RN Outcome: Progressing   Problem: Pain Managment: Goal: General experience of comfort will improve 07/12/2022 1449 by Lucilla Lame, RN Outcome: Adequate for Discharge 07/12/2022 1449 by Lucilla Lame, RN Outcome: Progressing   Problem: Safety: Goal: Ability to remain free from injury will improve 07/12/2022 1449 by Lucilla Lame, RN Outcome: Adequate for Discharge 07/12/2022 1449 by Lucilla Lame, RN Outcome: Progressing   Problem: Skin Integrity: Goal: Risk for impaired skin integrity will decrease 07/12/2022 1449 by Lucilla Lame, RN Outcome: Adequate for Discharge 07/12/2022 1449 by Lucilla Lame, RN Outcome: Progressing

## 2022-07-12 NOTE — Op Note (Signed)
[facility] Patient Name: Timothy [name] Procedure Date : 07/12/2022 MRN: [id number] Attending MD: Estill Cotta. Loletha Carrow , MD Date of Birth: 18-Sep-1961 CSN: [id number] Age: 61 Admit Type: Inpatient Procedure:                Upper GI endoscopy Indications:              Iron deficiency anemia secondary to chronic blood                            loss                           recent GI bleeding believed to have been UGI. Bx of                            duodenal site caused bleeding requiring clip                           Subsequent Hgb drop without overt bleeding Providers:                Mallie Mussel L. Loletha Carrow, MD, Grace Isaac, RN, Brien Mates, RNFA, Luan Moore, Technician Referring MD:             [facility] Medicines:                Monitored Anesthesia Care Complications:            No immediate complications. Estimated Blood Loss:     Estimated blood loss: none. Procedure:                Pre-Anesthesia Assessment:                           - Prior to the procedure, a History and Physical                            was performed, and patient medications and                            allergies were reviewed. The patient's tolerance of                            previous anesthesia was also reviewed. The risks                            and benefits of the procedure and the sedation                            options and risks were discussed with the patient.                            All questions were answered, and informed consent  was obtained. Prior Anticoagulants: The patient has                            taken no previous anticoagulant or antiplatelet                            agents. ASA Grade Assessment: IV - A patient with                            severe systemic disease that is a constant threat                            to life. After reviewing the risks and benefits,                             the patient was deemed in satisfactory condition to                            undergo the procedure.                           After obtaining informed consent, the endoscope was                            passed under direct vision. Throughout the                            procedure, the patient's blood pressure, pulse, and                            oxygen saturations were monitored continuously. The                            [id number]-[id number] ([id number]) Olympus endoscope was introduced                            through the mouth, and advanced to the second part                            of duodenum. The upper GI endoscopy was                            accomplished without difficulty. The patient                            tolerated the procedure well. Scope In: Scope Out: Findings:      The esophagus was normal.      The stomach was normal.      A foreign body was found in the second portion of the duodenum. (clip       from prior endoscopic intervention). There were also coils from prior IR       bleeding intervention in the duodenal bulb.      The exam of the duodenum was otherwise normal. No fresh or old blood in  the UGI tract. Impression:               - Normal esophagus.                           - Normal stomach.                           - Duodenal foreign body.                           - No specimens collected. Recommendation:           - Return patient to hospital ward for ongoing care.                           - See the other procedure note for documentation of                            additional recommendations. Procedure Code(s):        --- Professional ---                           [phone], Esophagogastroduodenoscopy, flexible,                            transoral; diagnostic, including collection of                            specimen(s) by brushing or washing, when performed                            (separate procedure) Diagnosis Code(s):        ---  Professional ---                           T18.3XXS, Foreign body in small intestine, sequela                           D50.0, Iron deficiency anemia secondary to blood                            loss (chronic) CPT copyright 2019 American Medical Association. All rights reserved. The codes documented in this report are preliminary and upon coder review may  be revised to meet current compliance requirements. Vila Dory L. Loletha Carrow, MD 07/12/2022 10:58:30 AM This report has been signed electronically. Number of Addenda: 0

## 2022-07-12 NOTE — Op Note (Signed)
[facility] Patient Name: Timothy [name] Procedure Date : 07/12/2022 MRN: [id number] Attending MD: Estill Cotta. Loletha Carrow , MD Date of Birth: April 13, 1961 CSN: [id number] Age: 61 Admit Type: Inpatient Procedure:                Colonoscopy Indications:              Acute post hemorrhagic anemia                           Recent GI bleeding, believed to be UGI source                           subsequent CTA suggested possible splenic flexure                            spot of extravasation                           Overt bleeding stopped, then Hgb drop                           Hx CRC with right hemicolectomy 2014                           poor prep on colonoscopy 1 week ago Providers:                Mallie Mussel L. Loletha Carrow, MD, Grace Isaac, RN, Luan Moore, Technician, Brien Mates, RNFA Referring MD:             [facility] Medicines:                Monitored Anesthesia Care Complications:            No immediate complications. Estimated Blood Loss:     Estimated blood loss: none. Procedure:                Pre-Anesthesia Assessment:                           - Prior to the procedure, a History and Physical                            was performed, and patient medications and                            allergies were reviewed. The patient's tolerance of                            previous anesthesia was also reviewed. The risks                            and benefits of the procedure and the sedation                            options and risks were discussed with the  patient.                            All questions were answered, and informed consent                            was obtained. Prior Anticoagulants: The patient has                            taken no previous anticoagulant or antiplatelet                            agents. ASA Grade Assessment: IV - A patient with                            severe systemic disease that is a constant  threat                            to life. After reviewing the risks and benefits,                            the patient was deemed in satisfactory condition to                            undergo the procedure.                           After obtaining informed consent, the colonoscope                            was passed under direct vision. Throughout the                            procedure, the patient's blood pressure, pulse, and                            oxygen saturations were monitored continuously. The                            [id number]-HQ190L ([id number]) Olympus coloscope was                            introduced through the anus and advanced to the the                            neo-terminal ileum. The colonoscopy was extremely                            difficult due to a redundant colon and a tortuous                            colon. Successful completion of the procedure was  aided by changing the patient to a supine position,                            using manual pressure, withdrawing the scope and                            replacing with the pediatric colonoscope,                            straightening and shortening the scope to obtain                            bowel loop reduction and lavage. The patient                            tolerated the procedure well. The quality of the                            bowel preparation was good in most areas, fair in                            others with residual fibrous food debris. The                            terminal ileum, the rectum and ileo-colonic                            anastomosis were photographed. Scope In: 10:10:22 AM Scope Out: 10:52:53 AM Scope Withdrawal Time: 0 hours 14 minutes 22 seconds  Total Procedure Duration: 0 hours 42 minutes 31 seconds  Findings:      The digital rectal exam findings include decreased sphincter tone.      There was evidence of a prior side-to-side  ileo-colonic anastomosis in       the distal transverse colon. This was patent and was characterized by       healthy appearing mucosa. The anastomosis was traversed.      The neo-terminal ileum appeared normal.      Two diminutive clean-based ulcers were found in the mid transverse       colon. No bleeding was present. No stigmata of recent bleeding were       seen. They did not appear to be bleeding sites.      The left colon was significantly tortuous and redundant, making scope       passage very difficult.      The exam was otherwise without abnormality on direct and retroflexion       views. Impression:               - Decreased sphincter tone found on digital rectal                            exam.                           - Patent side-to-side ileo-colonic anastomosis,  characterized by healthy appearing mucosa.                           - The examined portion of the ileum was normal.                           - A few ulcers in the mid transverse colon.                           - Tortuous colon.                           - The examination was otherwise normal on direct                            and retroflexion views.                           - No specimens collected.                           There is no active GI bleeding seen on either EGD                            or colonoscopy today. Recommendation:           - Return patient to hospital ward for possible                            discharge same day. OK for discharge from a GI                            perspective.                           - Resume regular diet.                           - Repeat surveillance colonoscopy in 3 years. Procedure Code(s):        --- Professional ---                           [phone], Colonoscopy, flexible; diagnostic, including                            collection of specimen(s) by brushing or washing,                            when performed (separate  procedure) Diagnosis Code(s):        --- Professional ---                           K62.89, Other specified diseases of anus and rectum                           Z98.0, Intestinal bypass and anastomosis status  K63.3, Ulcer of intestine                           D62, Acute posthemorrhagic anemia                           Q43.8, Other specified congenital malformations of                            intestine CPT copyright 2019 American Medical Association. All rights reserved. The codes documented in this report are preliminary and upon coder review may  be revised to meet current compliance requirements. Carisa Backhaus L. Loletha Carrow, MD 07/12/2022 11:08:23 AM This report has been signed electronically. Number of Addenda: 0

## 2022-07-12 NOTE — Progress Notes (Signed)
Pt refusing to wear cardiac monitor. Patient educated on importance of monitoring patient states he understands.Marland Kitchen

## 2022-07-12 NOTE — Progress Notes (Signed)
Mobility Specialist Progress Note    07/12/22 1259  Mobility  Activity Ambulated independently in hallway  Level of Assistance Independent  Assistive Device None  Distance Ambulated (ft) 60 ft  Activity Response Tolerated well  $Mobility charge 1 Mobility   Pt received standing EOB. C/o about wanting to go home. Returned to room.   Hildred Alamin Mobility Specialist

## 2022-07-13 ENCOUNTER — Telehealth: Payer: Self-pay | Admitting: Nurse Practitioner

## 2022-07-13 NOTE — Telephone Encounter (Signed)
Transition of care contact from inpatient facility  Date of discharge: 07/12/2022 Date of contact: 07/13/2022 Method: Phone Spoke to: Patient  Patient contacted to discuss transition of care from recent inpatient hospitalization. Patient was admitted to [facility] from 09/18-09/27/2023 with discharge diagnosis of Acute GIB.   Medication changes were reviewed.  Patient will follow up with his/her outpatient HD unit on: 07/13/2022

## 2022-07-14 ENCOUNTER — Encounter (HOSPITAL_COMMUNITY): Payer: Self-pay | Admitting: Gastroenterology

## 2022-07-26 ENCOUNTER — Encounter (HOSPITAL_COMMUNITY): Payer: Self-pay | Admitting: *Deleted

## 2022-07-26 ENCOUNTER — Inpatient Hospital Stay (HOSPITAL_COMMUNITY)
Admission: EM | Admit: 2022-07-26 | Discharge: 2022-08-02 | DRG: 377 | Disposition: A | Discharge status: Home / Self Care | Payer: Medicare Other | Source: Ambulatory Visit | Attending: Internal Medicine | Admitting: Internal Medicine

## 2022-07-26 DIAGNOSIS — K2970 Gastritis, unspecified, without bleeding: Secondary | ICD-10-CM | POA: Diagnosis present

## 2022-07-26 DIAGNOSIS — I10 Essential (primary) hypertension: Secondary | ICD-10-CM | POA: Diagnosis present

## 2022-07-26 DIAGNOSIS — Z87891 Personal history of nicotine dependence: Secondary | ICD-10-CM

## 2022-07-26 DIAGNOSIS — Z8249 Family history of ischemic heart disease and other diseases of the circulatory system: Secondary | ICD-10-CM

## 2022-07-26 DIAGNOSIS — I132 Hypertensive heart and chronic kidney disease with heart failure and with stage 5 chronic kidney disease, or end stage renal disease: Secondary | ICD-10-CM | POA: Diagnosis present

## 2022-07-26 DIAGNOSIS — K264 Chronic or unspecified duodenal ulcer with hemorrhage: Secondary | ICD-10-CM | POA: Diagnosis not present

## 2022-07-26 DIAGNOSIS — D649 Anemia, unspecified: Secondary | ICD-10-CM

## 2022-07-26 DIAGNOSIS — I272 Pulmonary hypertension, unspecified: Secondary | ICD-10-CM | POA: Diagnosis present

## 2022-07-26 DIAGNOSIS — I071 Rheumatic tricuspid insufficiency: Secondary | ICD-10-CM | POA: Diagnosis present

## 2022-07-26 DIAGNOSIS — Z841 Family history of disorders of kidney and ureter: Secondary | ICD-10-CM

## 2022-07-26 DIAGNOSIS — Z79899 Other long term (current) drug therapy: Secondary | ICD-10-CM

## 2022-07-26 DIAGNOSIS — R195 Other fecal abnormalities: Secondary | ICD-10-CM

## 2022-07-26 DIAGNOSIS — N2581 Secondary hyperparathyroidism of renal origin: Secondary | ICD-10-CM | POA: Diagnosis present

## 2022-07-26 DIAGNOSIS — D500 Iron deficiency anemia secondary to blood loss (chronic): Secondary | ICD-10-CM

## 2022-07-26 DIAGNOSIS — Z8619 Personal history of other infectious and parasitic diseases: Secondary | ICD-10-CM

## 2022-07-26 DIAGNOSIS — Z992 Dependence on renal dialysis: Secondary | ICD-10-CM

## 2022-07-26 DIAGNOSIS — R0602 Shortness of breath: Secondary | ICD-10-CM | POA: Diagnosis not present

## 2022-07-26 DIAGNOSIS — E871 Hypo-osmolality and hyponatremia: Secondary | ICD-10-CM | POA: Diagnosis not present

## 2022-07-26 DIAGNOSIS — N400 Enlarged prostate without lower urinary tract symptoms: Secondary | ICD-10-CM | POA: Diagnosis present

## 2022-07-26 DIAGNOSIS — K922 Gastrointestinal hemorrhage, unspecified: Secondary | ICD-10-CM | POA: Diagnosis present

## 2022-07-26 DIAGNOSIS — Z9049 Acquired absence of other specified parts of digestive tract: Secondary | ICD-10-CM

## 2022-07-26 DIAGNOSIS — I712 Thoracic aortic aneurysm, without rupture, unspecified: Secondary | ICD-10-CM | POA: Diagnosis present

## 2022-07-26 DIAGNOSIS — D62 Acute posthemorrhagic anemia: Secondary | ICD-10-CM | POA: Diagnosis present

## 2022-07-26 DIAGNOSIS — I5043 Acute on chronic combined systolic (congestive) and diastolic (congestive) heart failure: Secondary | ICD-10-CM | POA: Diagnosis present

## 2022-07-26 DIAGNOSIS — Z85038 Personal history of other malignant neoplasm of large intestine: Secondary | ICD-10-CM

## 2022-07-26 DIAGNOSIS — Z833 Family history of diabetes mellitus: Secondary | ICD-10-CM

## 2022-07-26 DIAGNOSIS — N186 End stage renal disease: Secondary | ICD-10-CM | POA: Diagnosis present

## 2022-07-26 DIAGNOSIS — I480 Paroxysmal atrial fibrillation: Secondary | ICD-10-CM | POA: Diagnosis present

## 2022-07-26 DIAGNOSIS — Z634 Disappearance and death of family member: Secondary | ICD-10-CM

## 2022-07-26 NOTE — ED Triage Notes (Signed)
The pt is a dialysis pt that was called today and told that his hgb was low  the blood was drawn yestereday  dialysis graft in his lt arm  no active bleeding that he knws of  and his stools have been brown

## 2022-07-26 NOTE — ED Provider Triage Note (Signed)
Emergency Medicine Provider Triage Evaluation Note  Timothy [name] , a 61 y.o. male  was evaluated in triage.  Pt complains of low hemoglobin.  Seen by dialysis where his blood work was drawn and had a hemoglobin of 5.5.  Patient had colonoscopy and EGD last week that did not show any evidence of acute bleeding.  Patient with history of iron deficiency anemia and chronic blood loss anemia.  Patient reports generalized weakness.  No syncope.  Does report some shortness of breath.  No chest pain..  Review of Systems  Positive: Shortness of breath Negative: Melena, hematochezia  Physical Exam  BP (!) 168/101   Pulse 86   Temp 97.8 F (36.6 C)   Resp 18   Ht '6\' 1"'$  (1.854 m)   Wt 74 kg   SpO2 95%   BMI 21.52 kg/m  Gen:   Awake, no distress   Resp:  Normal effort  MSK:   Moves extremities without difficulty  Other:  Lungs clear to auscultation.  No focal abdominal tenderness.  Medical Decision Making  Medically screening exam initiated at 7:03 PM.  Appropriate orders placed.  Timothy [name] was informed that the remainder of the evaluation will be completed by another provider, this initial triage assessment does not replace that evaluation, and the importance of remaining in the ED until their evaluation is complete.  Patient presents for low hemoglobin.  Patient reports some shortness of breath.    Doristine Devoid, PA-C 07/26/22 1906

## 2022-07-26 NOTE — ED Notes (Signed)
The pt has a strong odor of pot on his clothes

## 2022-07-27 ENCOUNTER — Other Ambulatory Visit: Payer: Self-pay

## 2022-07-27 ENCOUNTER — Emergency Department (HOSPITAL_COMMUNITY): Payer: Medicare Other

## 2022-07-27 DIAGNOSIS — Z8619 Personal history of other infectious and parasitic diseases: Secondary | ICD-10-CM | POA: Diagnosis not present

## 2022-07-27 DIAGNOSIS — D649 Anemia, unspecified: Secondary | ICD-10-CM | POA: Diagnosis not present

## 2022-07-27 DIAGNOSIS — Z992 Dependence on renal dialysis: Secondary | ICD-10-CM

## 2022-07-27 DIAGNOSIS — N186 End stage renal disease: Secondary | ICD-10-CM

## 2022-07-27 DIAGNOSIS — I272 Pulmonary hypertension, unspecified: Secondary | ICD-10-CM | POA: Diagnosis present

## 2022-07-27 DIAGNOSIS — I10 Essential (primary) hypertension: Secondary | ICD-10-CM | POA: Diagnosis not present

## 2022-07-27 DIAGNOSIS — K2970 Gastritis, unspecified, without bleeding: Secondary | ICD-10-CM | POA: Diagnosis present

## 2022-07-27 DIAGNOSIS — K922 Gastrointestinal hemorrhage, unspecified: Secondary | ICD-10-CM

## 2022-07-27 DIAGNOSIS — Z85038 Personal history of other malignant neoplasm of large intestine: Secondary | ICD-10-CM | POA: Diagnosis not present

## 2022-07-27 DIAGNOSIS — Z841 Family history of disorders of kidney and ureter: Secondary | ICD-10-CM | POA: Diagnosis not present

## 2022-07-27 DIAGNOSIS — N2581 Secondary hyperparathyroidism of renal origin: Secondary | ICD-10-CM | POA: Diagnosis present

## 2022-07-27 DIAGNOSIS — I5043 Acute on chronic combined systolic (congestive) and diastolic (congestive) heart failure: Secondary | ICD-10-CM | POA: Diagnosis present

## 2022-07-27 DIAGNOSIS — D631 Anemia in chronic kidney disease: Secondary | ICD-10-CM | POA: Diagnosis not present

## 2022-07-27 DIAGNOSIS — N400 Enlarged prostate without lower urinary tract symptoms: Secondary | ICD-10-CM | POA: Diagnosis present

## 2022-07-27 DIAGNOSIS — I071 Rheumatic tricuspid insufficiency: Secondary | ICD-10-CM | POA: Diagnosis present

## 2022-07-27 DIAGNOSIS — R195 Other fecal abnormalities: Secondary | ICD-10-CM | POA: Insufficient documentation

## 2022-07-27 DIAGNOSIS — Z9049 Acquired absence of other specified parts of digestive tract: Secondary | ICD-10-CM | POA: Diagnosis not present

## 2022-07-27 DIAGNOSIS — I480 Paroxysmal atrial fibrillation: Secondary | ICD-10-CM | POA: Diagnosis present

## 2022-07-27 DIAGNOSIS — I132 Hypertensive heart and chronic kidney disease with heart failure and with stage 5 chronic kidney disease, or end stage renal disease: Secondary | ICD-10-CM | POA: Diagnosis present

## 2022-07-27 DIAGNOSIS — T183XXA Foreign body in small intestine, initial encounter: Secondary | ICD-10-CM | POA: Diagnosis not present

## 2022-07-27 DIAGNOSIS — K3189 Other diseases of stomach and duodenum: Secondary | ICD-10-CM | POA: Diagnosis not present

## 2022-07-27 DIAGNOSIS — E871 Hypo-osmolality and hyponatremia: Secondary | ICD-10-CM | POA: Diagnosis not present

## 2022-07-27 DIAGNOSIS — D500 Iron deficiency anemia secondary to blood loss (chronic): Secondary | ICD-10-CM | POA: Diagnosis not present

## 2022-07-27 DIAGNOSIS — Z634 Disappearance and death of family member: Secondary | ICD-10-CM | POA: Diagnosis not present

## 2022-07-27 DIAGNOSIS — D62 Acute posthemorrhagic anemia: Secondary | ICD-10-CM | POA: Diagnosis present

## 2022-07-27 DIAGNOSIS — K264 Chronic or unspecified duodenal ulcer with hemorrhage: Secondary | ICD-10-CM | POA: Diagnosis present

## 2022-07-27 DIAGNOSIS — K2971 Gastritis, unspecified, with bleeding: Secondary | ICD-10-CM | POA: Diagnosis not present

## 2022-07-27 DIAGNOSIS — K921 Melena: Secondary | ICD-10-CM | POA: Diagnosis not present

## 2022-07-27 DIAGNOSIS — Z79899 Other long term (current) drug therapy: Secondary | ICD-10-CM | POA: Diagnosis not present

## 2022-07-27 DIAGNOSIS — I712 Thoracic aortic aneurysm, without rupture, unspecified: Secondary | ICD-10-CM | POA: Diagnosis present

## 2022-07-27 DIAGNOSIS — Z87891 Personal history of nicotine dependence: Secondary | ICD-10-CM | POA: Diagnosis not present

## 2022-07-27 DIAGNOSIS — R0602 Shortness of breath: Secondary | ICD-10-CM | POA: Diagnosis present

## 2022-07-27 DIAGNOSIS — Z8249 Family history of ischemic heart disease and other diseases of the circulatory system: Secondary | ICD-10-CM | POA: Diagnosis not present

## 2022-07-27 DIAGNOSIS — I509 Heart failure, unspecified: Secondary | ICD-10-CM | POA: Diagnosis not present

## 2022-07-27 DIAGNOSIS — Z833 Family history of diabetes mellitus: Secondary | ICD-10-CM | POA: Diagnosis not present

## 2022-07-27 LAB — BASIC METABOLIC PANEL
Anion gap: 18 — ABNORMAL HIGH (ref 5–15)
BUN: 70 mg/dL — ABNORMAL HIGH (ref 8–23)
CO2: 29 mmol/L (ref 22–32)
Calcium: 7.8 mg/dL — ABNORMAL LOW (ref 8.9–10.3)
Chloride: 97 mmol/L — ABNORMAL LOW (ref 98–111)
Creatinine, Ser: 8.28 mg/dL — ABNORMAL HIGH (ref 0.61–1.24)
GFR, Estimated: 7 mL/min — ABNORMAL LOW (ref >60)
Glucose, Bld: 93 mg/dL (ref 70–99)
Potassium: 4.1 mmol/L (ref 3.5–5.1)
Sodium: 144 mmol/L (ref 135–145)

## 2022-07-27 LAB — CBC WITH DIFFERENTIAL/PLATELET
Abs Immature Granulocytes: 0.03 10*3/uL (ref 0.00–0.07)
Basophils Absolute: 0 10*3/uL (ref 0.0–0.1)
Basophils Relative: 0 %
Eosinophils Absolute: 0.1 10*3/uL (ref 0.0–0.5)
Eosinophils Relative: 2 %
HCT: 15.8 % — ABNORMAL LOW (ref 39.0–52.0)
Hemoglobin: 4.7 g/dL — CL (ref 13.0–17.0)
Immature Granulocytes: 0 %
Lymphocytes Relative: 8 %
Lymphs Abs: 0.5 10*3/uL — ABNORMAL LOW (ref 0.7–4.0)
MCH: 30.1 pg (ref 26.0–34.0)
MCHC: 29.7 g/dL — ABNORMAL LOW (ref 30.0–36.0)
MCV: 101.3 fL — ABNORMAL HIGH (ref 80.0–100.0)
Monocytes Absolute: 0.9 10*3/uL (ref 0.1–1.0)
Monocytes Relative: 13 %
Neutro Abs: 5.4 10*3/uL (ref 1.7–7.7)
Neutrophils Relative %: 77 %
Platelets: 319 10*3/uL (ref 150–400)
RBC: 1.56 MIL/uL — ABNORMAL LOW (ref 4.22–5.81)
RDW: 22.1 % — ABNORMAL HIGH (ref 11.5–15.5)
WBC: 7 10*3/uL (ref 4.0–10.5)
nRBC: 0 % (ref 0.0–0.2)

## 2022-07-27 LAB — HEPATIC FUNCTION PANEL
ALT: 17 U/L (ref 0–44)
AST: 25 U/L (ref 15–41)
Albumin: 2.8 g/dL — ABNORMAL LOW (ref 3.5–5.0)
Alkaline Phosphatase: 104 U/L (ref 38–126)
Bilirubin, Direct: 0.2 mg/dL (ref 0.0–0.2)
Indirect Bilirubin: 0.4 mg/dL (ref 0.3–0.9)
Total Bilirubin: 0.6 mg/dL (ref 0.3–1.2)
Total Protein: 6.9 g/dL (ref 6.5–8.1)

## 2022-07-27 LAB — POC OCCULT BLOOD, ED: Fecal Occult Bld: POSITIVE — AB

## 2022-07-27 LAB — HEMOGLOBIN AND HEMATOCRIT, BLOOD
HCT: 20.7 % — ABNORMAL LOW (ref 39.0–52.0)
Hemoglobin: 6.8 g/dL — CL (ref 13.0–17.0)

## 2022-07-27 LAB — PREPARE RBC (CROSSMATCH)

## 2022-07-27 LAB — MRSA NEXT GEN BY PCR, NASAL: MRSA by PCR Next Gen: NOT DETECTED

## 2022-07-27 LAB — LACTATE DEHYDROGENASE: LDH: 140 U/L (ref 98–192)

## 2022-07-27 MED ORDER — ALBUTEROL SULFATE (2.5 MG/3ML) 0.083% IN NEBU: 2.5000 mg | INHALATION_SOLUTION | Freq: Four times a day (QID) | RESPIRATORY_TRACT | Status: DC | PRN

## 2022-07-27 MED ORDER — PANTOPRAZOLE SODIUM 40 MG IV SOLR
40.0000 mg | Freq: Two times a day (BID) | INTRAVENOUS | Status: DC
  Administered 2022-07-30 – 2022-08-01 (×4): 40 mg via INTRAVENOUS
  Filled 2022-07-27 (×4): qty 10

## 2022-07-27 MED ORDER — RENA-VITE PO TABS
1.0000 | ORAL_TABLET | Freq: Every day | ORAL | Status: DC
  Administered 2022-07-27 – 2022-08-01 (×6): 1 via ORAL
  Filled 2022-07-27 (×7): qty 1

## 2022-07-27 MED ORDER — PANTOPRAZOLE 80MG IVPB - SIMPLE MED
80.0000 mg | Freq: Once | INTRAVENOUS | Status: AC
  Administered 2022-07-27: 80 mg via INTRAVENOUS
  Filled 2022-07-27: qty 100

## 2022-07-27 MED ORDER — GABAPENTIN 100 MG PO CAPS
100.0000 mg | ORAL_CAPSULE | ORAL | Status: DC
  Administered 2022-07-27 – 2022-08-01 (×2): 100 mg via ORAL
  Filled 2022-07-27 (×3): qty 1

## 2022-07-27 MED ORDER — SODIUM CHLORIDE 0.9 % IV SOLN: 62.5000 mg | INTRAVENOUS | Status: DC

## 2022-07-27 MED ORDER — ONDANSETRON HCL 4 MG/2ML IJ SOLN: 4.0000 mg | Freq: Four times a day (QID) | INTRAMUSCULAR | Status: DC | PRN

## 2022-07-27 MED ORDER — SODIUM CHLORIDE 0.9 % IV SOLN
10.0000 mL/h | Freq: Once | INTRAVENOUS | Status: AC
  Administered 2022-07-27: 10 mL/h via INTRAVENOUS

## 2022-07-27 MED ORDER — CHLORHEXIDINE GLUCONATE CLOTH 2 % EX PADS
6.0000 | MEDICATED_PAD | Freq: Every day | CUTANEOUS | Status: DC
  Administered 2022-07-28 – 2022-07-31 (×2): 6 via TOPICAL

## 2022-07-27 MED ORDER — FLUTICASONE PROPIONATE 50 MCG/ACT NA SUSP
2.0000 | Freq: Every day | NASAL | Status: DC
  Administered 2022-07-29 – 2022-08-02 (×5): 2 via NASAL
  Filled 2022-07-27 (×2): qty 16

## 2022-07-27 MED ORDER — ONDANSETRON HCL 4 MG PO TABS: 4.0000 mg | ORAL_TABLET | Freq: Four times a day (QID) | ORAL | Status: DC | PRN

## 2022-07-27 MED ORDER — ACETAMINOPHEN 650 MG RE SUPP: 650.0000 mg | Freq: Four times a day (QID) | RECTAL | Status: DC | PRN

## 2022-07-27 MED ORDER — HYDRALAZINE HCL 25 MG PO TABS
25.0000 mg | ORAL_TABLET | Freq: Four times a day (QID) | ORAL | Status: DC
  Administered 2022-07-27 – 2022-08-02 (×21): 25 mg via ORAL
  Filled 2022-07-27 (×22): qty 1

## 2022-07-27 MED ORDER — LOSARTAN POTASSIUM 50 MG PO TABS
25.0000 mg | ORAL_TABLET | Freq: Every day | ORAL | Status: DC
  Administered 2022-07-27 – 2022-08-02 (×7): 25 mg via ORAL
  Filled 2022-07-27 (×7): qty 1

## 2022-07-27 MED ORDER — ACETAMINOPHEN 325 MG PO TABS
650.0000 mg | ORAL_TABLET | Freq: Four times a day (QID) | ORAL | Status: DC | PRN
  Administered 2022-07-30 – 2022-08-01 (×2): 650 mg via ORAL
  Filled 2022-07-27 (×2): qty 2

## 2022-07-27 MED ORDER — PANTOPRAZOLE SODIUM 40 MG PO TBEC
40.0000 mg | DELAYED_RELEASE_TABLET | Freq: Two times a day (BID) | ORAL | Status: DC
  Administered 2022-07-27 (×2): 40 mg via ORAL
  Filled 2022-07-27 (×2): qty 1

## 2022-07-27 MED ORDER — DILTIAZEM HCL ER BEADS 240 MG PO CP24
360.0000 mg | ORAL_CAPSULE | Freq: Every day | ORAL | Status: DC
  Administered 2022-07-28 – 2022-07-30 (×3): 360 mg via ORAL
  Filled 2022-07-27 (×8): qty 1

## 2022-07-27 MED ORDER — MONTELUKAST SODIUM 10 MG PO TABS
10.0000 mg | ORAL_TABLET | Freq: Every day | ORAL | Status: DC
  Administered 2022-07-27 – 2022-08-01 (×6): 10 mg via ORAL
  Filled 2022-07-27 (×6): qty 1

## 2022-07-27 MED ORDER — CARVEDILOL 3.125 MG PO TABS
3.1250 mg | ORAL_TABLET | Freq: Two times a day (BID) | ORAL | Status: DC
  Administered 2022-07-27 – 2022-07-30 (×6): 3.125 mg via ORAL
  Filled 2022-07-27 (×6): qty 1

## 2022-07-27 MED ORDER — SEVELAMER CARBONATE 800 MG PO TABS
1600.0000 mg | ORAL_TABLET | Freq: Three times a day (TID) | ORAL | Status: DC
  Administered 2022-07-27 – 2022-08-02 (×14): 1600 mg via ORAL
  Filled 2022-07-27 (×14): qty 2

## 2022-07-27 MED ORDER — MAGNESIUM HYDROXIDE 400 MG/5ML PO SUSP
30.0000 mL | Freq: Every day | ORAL | Status: DC | PRN
  Administered 2022-07-31: 30 mL via ORAL
  Filled 2022-07-27: qty 30

## 2022-07-27 MED ORDER — PANTOPRAZOLE INFUSION (NEW) - SIMPLE MED
8.0000 mg/h | INTRAVENOUS | Status: AC
  Administered 2022-07-27 (×2): 8 mg/h via INTRAVENOUS
  Filled 2022-07-27 (×3): qty 100

## 2022-07-27 MED ORDER — DOXERCALCIFEROL 4 MCG/2ML IV SOLN
2.0000 ug | INTRAVENOUS | Status: DC
  Administered 2022-07-27 – 2022-08-01 (×3): 2 ug via INTRAVENOUS
  Filled 2022-07-27 (×4): qty 2

## 2022-07-27 MED ORDER — TRAZODONE HCL 50 MG PO TABS
25.0000 mg | ORAL_TABLET | Freq: Every evening | ORAL | Status: DC | PRN
  Administered 2022-07-30 – 2022-08-02 (×4): 25 mg via ORAL
  Filled 2022-07-27 (×4): qty 1

## 2022-07-27 MED ORDER — LIDOCAINE-PRILOCAINE 2.5-2.5 % EX CREA: TOPICAL_CREAM | CUTANEOUS | Status: DC

## 2022-07-27 MED ORDER — OXYCODONE HCL 5 MG PO TABS
5.0000 mg | ORAL_TABLET | Freq: Three times a day (TID) | ORAL | Status: DC | PRN
  Administered 2022-07-27 – 2022-08-02 (×12): 5 mg via ORAL
  Filled 2022-07-27 (×12): qty 1

## 2022-07-27 NOTE — Assessment & Plan Note (Addendum)
Continue renal replacement therapy Tuesday, Thursday and Saturday Chronic hyponatremia.   Follow up Na 124, K 4,1 and serum bicarbonate 26 with BUN 52.   Anemia of chronic renal disease, with iron deficiency. Continue iron supplementation and EPO.

## 2022-07-27 NOTE — Assessment & Plan Note (Addendum)
Continue rate control with diltiazem and carvedilol Continue to hold on anticoagulation due to recurrent gastrointestinal bleeding.

## 2022-07-27 NOTE — Procedures (Signed)
   I was present at this dialysis session, have reviewed the session itself and made  appropriate changes Kelly Splinter MD [place] pager [phone]   07/27/2022, 2:48 PM

## 2022-07-27 NOTE — ED Notes (Signed)
IV insertion attempted without success, IV team consult requested.

## 2022-07-27 NOTE — Progress Notes (Signed)
PROGRESS NOTE        PATIENT DETAILS Name: Timothy [name] Age: 61 y.o. Sex: male Date of Birth: 1961-01-03 Admit Date: 07/26/2022 Admitting Physician Christel Mormon, MD BDZ:[id number], Iona Beard, MD  Brief Summary: Patient is a 61 y.o.  male with history of ESRD on HD TTS, history of duodenal ulcer requiring IR embolization on 11/16/2021, recurrent GI bleeding requiring numerous hospitalizations and multiple endoscopic evaluations-just discharged from 9/27-referred to the ED from HD clinic due to severe anemia with a hemoglobin of 4.7.  Per patient-since his most recent discharge on 9/27-his stools have been black.  Significant events: 9/18-9/27>> hospitalization for presumed GI bleeding-CTA showed extravasation at splenic flexure, however EGD/colonoscopy were negative.  Required 3 units of PRBC. 10/11>> referred to the ED from HD clinic for hemoglobin of 4.7.  Brown stools per patient.  Significant studies: 10/12>> CXR: No obvious PNA.  Significant microbiology data: None  Procedures: None  Consults: GI Urology  Subjective: Extremely frustrated/argumentative that he is NPO. Poor historian due to him being upset of recurrent hospitalization-been after many attempts-acknowledges his stools have been brown since his most recent discharge on 9/27.  Denies melanotic stools/hematochezia/hematemesis.  No abdominal pain.  Objective: Vitals: Blood pressure (!) 169/103, pulse 90, temperature (!) 97.4 F (36.3 C), temperature source Oral, resp. rate 20, height '6\' 1"'$  (1.854 m), weight 74 kg, SpO2 94 %.   Exam: Gen Exam:Alert awake-not in any distress HEENT:atraumatic, normocephalic Chest: B/L clear to auscultation anteriorly CVS:S1S2 regular Abdomen:soft non tender, non distended Extremities:no edema Neurology: Non focal Skin: no rash  Pertinent Labs/Radiology:    Latest Ref Rng & Units 07/27/2022    4:09 AM 07/12/2022   12:29 AM 07/11/2022   12:16 AM   CBC  WBC 4.0 - 10.5 K/uL 7.0  5.5  6.2   Hemoglobin 13.0 - 17.0 g/dL 4.7  7.4  7.4   Hematocrit 39.0 - 52.0 % 15.8  23.5  22.6   Platelets 150 - 400 K/uL 319  260  234     Lab Results  Component Value Date   NA 144 07/27/2022   K 4.1 07/27/2022   CL 97 (L) 07/27/2022   CO2 29 07/27/2022      Assessment/Plan: Severe anemia Presumed recurrent GI bleeding with acute blood loss anemia Poor historian-frustrated that he is again hospitalized-frustrated about being n.p.o. But denies any melanotic stools/hematochezia/hematemesis since discharge on 9/27 No evidence of hemolysis with normal bilirubin/LDH levels No other history of blood loss Getting 2 units of PRBC today Awaiting GI eval-does he need capsule endoscopy?  ESRD on HD TTS Await nephrology evaluation Defer HD care to nephrology service  Anemia Multifactorial-from acute blood loss-superimposed on anemia of ESRD Getting 2 units of PRBC Fe/EPO defer to nephrology service  Chronic HFpEF Volume status stable-do not think he has any evidence of volume overload (acute on chronic HFpEF ruled out on admission) Volume removal with HD  PAF Maintaining sinus rhythm Continue Coreg/diltiazem Not a candidate for anticoagulation given recurrent GI bleeding  HTN BP stable with Coreg, diltiazem, losartan  History of thoracic aortic aneurysm Continue outpatient surveillance  History of colon cancer-s/p right hemi-colectomy 2014 Colonoscopy x2 this year negative for recurrence  History of HCV Treated-undetectable September 2022  BMI: Estimated body mass index is 21.52 kg/m as calculated from the following:   Height as of this encounter: 6'  1" (1.854 m).   Weight as of this encounter: 74 kg.   Code status:   Code Status: Full Code   DVT Prophylaxis: SCDs Start: 07/27/22 0524   Family Communication:  None at bedside   Disposition Plan: Status is: Inpatient Remains inpatient appropriate because: Presumed GI  bleeding with acute blood loss anemia-getting 2 units of PRBC-not stable for discharge   Planned Discharge Destination:Home   Diet: Diet Order             Diet NPO time specified Except for: Sips with Meds, Ice Chips  Diet effective now                     Antimicrobial agents: Anti-infectives (From admission, onward)    None        MEDICATIONS: Scheduled Meds:  carvedilol  3.125 mg Oral BID WC   diltiazem  360 mg Oral Daily   fluticasone  2 spray Each Nare Daily   gabapentin  100 mg Oral Q T,Th,Sat-1800   hydrALAZINE  25 mg Oral Q6H   lidocaine-prilocaine   Topical Q T,Th,Sa-HD   losartan  25 mg Oral Daily   montelukast  10 mg Oral QHS   multivitamin  1 tablet Oral QHS   pantoprazole  40 mg Oral BID   [START ON 07/30/2022] pantoprazole  40 mg Intravenous Q12H   sevelamer carbonate  1,600 mg Oral TID WC   Continuous Infusions:  pantoprazole 8 mg/hr (07/27/22 0856)   PRN Meds:.acetaminophen **OR** acetaminophen, albuterol, magnesium hydroxide, ondansetron **OR** ondansetron (ZOFRAN) IV, oxyCODONE, traZODone   I have personally reviewed following labs and imaging studies  LABORATORY DATA: CBC: Recent Labs  Lab 07/27/22 0409  WBC 7.0  NEUTROABS 5.4  HGB 4.7*  HCT 15.8*  MCV 101.3*  PLT 710    Basic Metabolic Panel: Recent Labs  Lab 07/27/22 0409  NA 144  K 4.1  CL 97*  CO2 29  GLUCOSE 93  BUN 70*  CREATININE 8.28*  CALCIUM 7.8*    GFR: Estimated Creatinine Clearance: 9.8 mL/min (A) (by C-G formula based on SCr of 8.28 mg/dL (H)).  Liver Function Tests: Recent Labs  Lab 07/27/22 0409  AST 25  ALT 17  ALKPHOS 104  BILITOT 0.6  PROT 6.9  ALBUMIN 2.8*   No results for input(s): "LIPASE", "AMYLASE" in the last 168 hours. No results for input(s): "AMMONIA" in the last 168 hours.  Coagulation Profile: No results for input(s): "INR", "PROTIME" in the last 168 hours.  Cardiac Enzymes: No results for input(s): "CKTOTAL", "CKMB",  "CKMBINDEX", "TROPONINI" in the last 168 hours.  BNP (last 3 results) No results for input(s): "PROBNP" in the last 8760 hours.  Lipid Profile: No results for input(s): "CHOL", "HDL", "LDLCALC", "TRIG", "CHOLHDL", "LDLDIRECT" in the last 72 hours.  Thyroid Function Tests: No results for input(s): "TSH", "T4TOTAL", "FREET4", "T3FREE", "THYROIDAB" in the last 72 hours.  Anemia Panel: No results for input(s): "VITAMINB12", "FOLATE", "FERRITIN", "TIBC", "IRON", "RETICCTPCT" in the last 72 hours.  Urine analysis:    Component Value Date/Time   COLORURINE YELLOW 05/19/2021 2015   APPEARANCEUR HAZY (A) 05/19/2021 2015   LABSPEC 1.012 05/19/2021 2015   PHURINE 9.0 (H) 05/19/2021 2015   GLUCOSEU NEGATIVE 05/19/2021 2015   HGBUR NEGATIVE 05/19/2021 2015   BILIRUBINUR NEGATIVE 05/19/2021 2015   KETONESUR NEGATIVE 05/19/2021 2015   PROTEINUR >=300 (A) 05/19/2021 2015   NITRITE NEGATIVE 05/19/2021 2015   LEUKOCYTESUR SMALL (A) 05/19/2021 2015    Sepsis  Labs: Lactic Acid, Venous    Component Value Date/Time   LATICACIDVEN 3.4 (HH) 04/07/2022 0913    MICROBIOLOGY: No results found for this or any previous visit (from the past 240 hour(s)).  RADIOLOGY STUDIES/RESULTS: DG Chest Port 1 View  Result Date: 07/27/2022 CLINICAL DATA:  61 year old male with weakness. EXAM: PORTABLE CHEST 1 VIEW COMPARISON:  CT Abdomen and Pelvis 07/05/2022 and earlier. FINDINGS: Portable AP supine view at 0352 hours. Streaky, linear atelectasis or scarring at the right lower lung does not appear significantly changed from last month. Borderline to mild cardiomegaly. Calcified aortic atherosclerosis. Stable cardiac size and mediastinal contours. Visualized tracheal air column is within normal limits. Difficult to exclude asymmetric increased background interstitial opacity in the right lung, but favor artifact instead (the right chest soft tissues also appear denser). No pneumothorax, pleural effusion or  consolidation. IMPRESSION: 1. Difficult to exclude asymmetric right lung interstitial opacity or edema, but favor artifact due to portable technique instead. 2. Otherwise stable atelectasis or scarring in the right lower lung and mild cardiomegaly. Electronically Signed   By: Genevie Ann M.D.   On: 07/27/2022 04:25     LOS: 0 days   Oren Binet, MD  [facility]    To contact the attending provider between 7A-7P or the covering provider during after hours 7P-7A, please log into the web site [email or web address] and access using universal [place] password for that web site. If you do not have the password, please call the hospital operator.  07/27/2022, 9:29 AM

## 2022-07-27 NOTE — Assessment & Plan Note (Addendum)
Echocardiogram with preserved LV systolic function with EF 50 to 55%, moderate LVH, mild reduced RV systolic function. Mild enlargement in RV size, RVSP 56,7 mmHg. Moderate dilatation LA, moderate to severe TR.   Patient had ultrafiltration with improvement in his symptoms Plant to continue renal replacement therapy as outpatient.   Continue with carvedilol, hydralazine, diltiazem and losartan.

## 2022-07-27 NOTE — Assessment & Plan Note (Addendum)
Continue blood pressure control with losartan, diltiazem, carvedilol and hydralazine.

## 2022-07-27 NOTE — Consult Note (Signed)
Renal Service Consult Note [facility]  Finas Delone 07/27/2022 [name], MD Requesting Physician: Dr. Nena Alexander  Reason for Consult: ESRD pt w/ recurrent GI bleed HPI: The patient is a 61 y.o. year-old w/ hx of recurrent GIB, ESRD on HD, hep C, HTN, hx TAA and anemia who was sent to ED due to low Hb 5.0 from labs sent 10/10. In ED pt stated no dark stools. BP was 160/100, BUn 78, creat 8.2, Hb 4.7. CXR right sided IS edema possibly. Pt was ordered for 2u prbcs and admitted. We are asked to see for dialysis.   Pt w/o complaints. Pt lives w/ his family and takes [employer] transport to get to HD. Last HD Tuesday, has not missed.   ROS - denies CP, no joint pain, no HA, no blurry vision, no rash, no diarrhea, no nausea/ vomiting, no dysuria, no difficulty voiding   Past Medical History  Past Medical History:  Diagnosis Date   Acute metabolic encephalopathy 86/57/8469   Anemia of chronic kidney failure    BPH (benign prostatic hyperplasia)    Colon cancer ([place]) 2014   spouse states he had surgury for colon CA   End stage renal disease on dialysis ([place]) 2017   TTHSat   Hepatitis C    treated   Hypertension    Hypertensive crisis 04/07/2022   Hypertensive heart disease with chronic diastolic congestive heart failure ([place]) 07/17/2016   Polysubstance abuse ([place])    History of heroin and marijuana use   Thoracic aortic aneurysm ([place])    followed by Dr. Ellyn Hack   Past Surgical History  Past Surgical History:  Procedure Laterality Date   A/V FISTULAGRAM Left 12/02/2021   Procedure: A/V Fistulagram;  Surgeon: Cherre Robins, MD;  Location: [place] CV [facility];  Service: Cardiovascular;  Laterality: Left;   A/V FISTULAGRAM Left 04/24/2022   Procedure: A/V Fistulagram;  Surgeon: Waynetta Sandy, MD;  Location: [place] [facility];  Service: Cardiovascular;  Laterality: Left;   ABDOMINAL SURGERY     AV FISTULA PLACEMENT Left 09/19/2016   Procedure: Left  arm Radiocephalic ARTERIOVENOUS (AV) FISTULA CREATION;  Surgeon: Conrad Clarkton, MD;  Location: [place];  Service: Vascular;  Laterality: Left;   BASCILIC VEIN TRANSPOSITION Left 07/09/2017   Procedure: BRACHIOCEPHALIC FISTULA CREATION;  Surgeon: Conrad Fulton, MD;  Location: [place];  Service: Vascular;  Laterality: Left;   BIOPSY  07/05/2022   Procedure: BIOPSY;  Surgeon: Thornton Park, MD;  Location: [place];  Service: Gastroenterology;;   COLON SURGERY  2014   COLONOSCOPY N/A 07/12/2022   Procedure: COLONOSCOPY;  Surgeon: Doran Stabler, MD;  Location: [place];  Service: Gastroenterology;  Laterality: N/A;   COLONOSCOPY WITH PROPOFOL N/A 07/05/2022   Procedure: COLONOSCOPY WITH PROPOFOL;  Surgeon: Thornton Park, MD;  Location: [place];  Service: Gastroenterology;  Laterality: N/A;   ESOPHAGOGASTRODUODENOSCOPY (EGD) WITH PROPOFOL N/A 11/05/2021   Procedure: ESOPHAGOGASTRODUODENOSCOPY (EGD) WITH PROPOFOL;  Surgeon: Carol Ada, MD;  Location: [place];  Service: Endoscopy;  Laterality: N/A;   ESOPHAGOGASTRODUODENOSCOPY (EGD) WITH PROPOFOL N/A 11/14/2021   Procedure: ESOPHAGOGASTRODUODENOSCOPY (EGD) WITH PROPOFOL;  Surgeon: [name], MD;  Location: [place];  Service: Gastroenterology;  Laterality: N/A;   ESOPHAGOGASTRODUODENOSCOPY (EGD) WITH PROPOFOL N/A 11/16/2021   Procedure: ESOPHAGOGASTRODUODENOSCOPY (EGD) WITH PROPOFOL;  Surgeon: [name], MD;  Location: [place];  Service: Gastroenterology;  Laterality: N/A;   ESOPHAGOGASTRODUODENOSCOPY (EGD) WITH PROPOFOL N/A 07/05/2022   Procedure: ESOPHAGOGASTRODUODENOSCOPY (EGD) WITH  PROPOFOL;  Surgeon: Thornton Park, MD;  Location: [place];  Service: Gastroenterology;  Laterality: N/A;   ESOPHAGOGASTRODUODENOSCOPY (EGD) WITH PROPOFOL N/A 07/12/2022   Procedure: ESOPHAGOGASTRODUODENOSCOPY (EGD) WITH PROPOFOL;  Surgeon: Doran Stabler, MD;  Location: [place];  Service: Gastroenterology;  Laterality:  N/A;   HEMOSTASIS CLIP PLACEMENT  11/16/2021   Procedure: HEMOSTASIS CLIP PLACEMENT;  Surgeon: [name], MD;  Location: [facility] ENDOSCOPY;  Service: Gastroenterology;;   HEMOSTASIS CLIP PLACEMENT  07/05/2022   Procedure: HEMOSTASIS CLIP PLACEMENT;  Surgeon: Thornton Park, MD;  Location: [facility] ENDOSCOPY;  Service: Gastroenterology;;   HEMOSTASIS CONTROL  11/05/2021   Procedure: HEMOSTASIS CONTROL;  Surgeon: Carol Ada, MD;  Location: [place];  Service: Endoscopy;;   HEMOSTASIS CONTROL  11/16/2021   Procedure: HEMOSTASIS CONTROL;  Surgeon: [name], MD;  Location: [place];  Service: Gastroenterology;;   HEMOSTASIS CONTROL  07/05/2022   Procedure: HEMOSTASIS CONTROL;  Surgeon: Thornton Park, MD;  Location: [place];  Service: Gastroenterology;;   HOT HEMOSTASIS N/A 11/16/2021   Procedure: HOT HEMOSTASIS (ARGON PLASMA COAGULATION/BICAP);  Surgeon: [name], MD;  Location: [place];  Service: Gastroenterology;  Laterality: N/A;   INSERTION OF DIALYSIS CATHETER N/A 09/19/2016   Procedure: INSERTION OF TUNNELED DIALYSIS CATHETER;  Surgeon: Conrad Blunt, MD;  Location: [place];  Service: Vascular;  Laterality: N/A;   IR ANGIOGRAM SELECTIVE EACH ADDITIONAL VESSEL  11/16/2021   IR ANGIOGRAM VISCERAL SELECTIVE  11/05/2021   IR ANGIOGRAM VISCERAL SELECTIVE  11/16/2021   IR ANGIOGRAM VISCERAL SELECTIVE  11/16/2021   IR EMBO ART  VEN HEMORR LYMPH EXTRAV  INC GUIDE ROADMAPPING  11/05/2021   IR EMBO ART  VEN HEMORR LYMPH EXTRAV  INC GUIDE ROADMAPPING  11/16/2021   IR GENERIC HISTORICAL  09/14/2016   IR US GUIDE VASC ACCESS RIGHT 09/14/2016 [name], DO [facility]   IR GENERIC HISTORICAL  09/14/2016   IR FLUORO GUIDE CV LINE RIGHT 09/14/2016 [name], DO [facility]   IR PARACENTESIS  06/22/2021   IR US GUIDE [place] RIGHT  11/05/2021   IR US GUIDE [place] RIGHT  11/16/2021   LAPAROTOMY N/A 05/23/2021   Procedure: EXPLORATORY LAPAROTOMY LYSIS ADHESIONS;  Surgeon: [name], MD;  Location: [place];  Service: General;  Laterality: N/A;   LAPAROTOMY N/A 11/20/2021   Procedure: EXPLORATORY LAPAROTOMY, REPAIR OF BLEEDING DUODENAL ULCER;  Surgeon: Dwan Bolt, MD;  Location: [place];  Service: General;  Laterality: N/A;   ORIF TIBIA PLATEAU Left 01/15/2018   Procedure: OPEN REDUCTION INTERNAL FIXATION (ORIF) TIBIAL PLATEAU;  Surgeon: Altamese Monticello, MD;  Location: [place];  Service: Orthopedics;  Laterality: Left;   REVISON OF ARTERIOVENOUS FISTULA Left 05/26/2022   Procedure: REVISION OF LEFT ARM ARTERIOVENOUS FISTULA WITH PLICATION OF PSEUDOANEURYSM;  Surgeon: Waynetta Sandy, MD;  Location: [place];  Service: Vascular;  Laterality: Left;   SCLEROTHERAPY  11/05/2021   Procedure: Clide Deutscher;  Surgeon: Carol Ada, MD;  Location: [place];  Service: Endoscopy;;   SCLEROTHERAPY  11/16/2021   Procedure: Clide Deutscher;  Surgeon: [name], MD;  Location: [facility] ENDOSCOPY;  Service: Gastroenterology;;   TRANSTHORACIC ECHOCARDIOGRAM  07/2016    EF 60-65%, No RWMA. Mod Concentric LVH - Gr 2 DD. Severe LA dilation. PAP ~35 mmHg (mild Pulm HTN)  --> no changes noted 1 month later   Family History  Family History  Problem Relation Age of Onset   Heart failure Mother        Died  at age 75.   Heart attack Mother 43   Hypertension Mother    Diabetes Mellitus II Mother    Other Father        Unknown   Kidney failure Sister        (Oldest Sister)   Other Other        Multiple siblings have started her heart disease, he is not sure of the details.   CAD Nephew    Social History  reports that he quit smoking about 2 months ago. His smoking use included cigarettes. He smoked an average of .25 packs per day. He has never been exposed to tobacco smoke. He has never used smokeless tobacco. He reports current drug use. Frequency: 1.00 time per week. Drugs: Marijuana and Heroin. He reports that he does not drink alcohol. Allergies No Known Allergies Home  medications Prior to Admission medications   Medication Sig Start Date End Date Taking? Authorizing Provider  albuterol (VENTOLIN HFA) 108 (90 Base) MCG/ACT inhaler Inhale 2 puffs into the lungs every 6 (six) hours as needed for wheezing or shortness of breath. 01/06/22  Yes [provider]  carvedilol (COREG) 3.125 MG tablet Take 3.125 mg by mouth 2 (two) times daily with a meal.   Yes [provider]  diltiazem (TIAZAC) 360 MG 24 hr capsule Take 360 mg by mouth in the morning.    [provider]  DULoxetine (CYMBALTA) 20 MG capsule Take 20 mg by mouth daily. Patient not taking: Reported on 07/03/2022 01/06/22   [provider]  fluticasone (FLONASE) 50 MCG/ACT nasal spray Place 2 sprays into both nostrils daily. 06/26/22   [provider]  gabapentin (NEURONTIN) 100 MG capsule Take 1 capsule (100 mg total) by mouth every Tuesday, Thursday, and Saturday at 6 PM. 07/13/22   Florencia Reasons, MD  hydrALAZINE (APRESOLINE) 25 MG tablet Take 1 tablet (25 mg total) by mouth every 6 (six) hours. 03/15/22   Edwin Dada, MD  LIDOCAINE-PRILOCAINE EX Apply 1 application  topically Every Tuesday,Thursday,and Saturday with dialysis.    [provider]  losartan (COZAAR) 25 MG tablet Take 25 mg by mouth daily. 06/26/22   [provider]  montelukast (SINGULAIR) 10 MG tablet Take 10 mg by mouth at bedtime. 06/26/22   [provider]  multivitamin (RENA-VIT) TABS tablet Take 1 tablet by mouth at bedtime. 03/15/22   Danford, Suann Larry, MD  oxyCODONE (OXY IR/ROXICODONE) 5 MG immediate release tablet Take 5 mg by mouth every 8 (eight) hours as needed for pain or severe pain. 12/09/21   [provider]  pantoprazole (PROTONIX) 40 MG tablet Take 1 tablet (40 mg total) by mouth 2 (two) times daily. 07/12/22   Florencia Reasons, MD  sevelamer carbonate (RENVELA) 800 MG tablet Take 1,600 mg by mouth 3 (three) times daily with meals. Patient not taking:  Reported on 07/03/2022 11/14/21   [provider]     Vitals:   07/27/22 0856 07/27/22 0923 07/27/22 0930 07/27/22 0945  BP: (!) 167/105 (!) 169/103 (!) 163/112 (!) 175/105  Pulse: 91 90 90 90  Resp: (!) '21 20 18 20  '$ Temp: (!) 97.3 F (36.3 C) (!) 97.4 F (36.3 C)    TempSrc: Oral Oral    SpO2: 96% 94% 93% 93%  Weight:      Height:       Exam Gen alert, no distress, disheveled No rash, cyanosis or gangrene Sclera anicteric, throat clear  No jvd or bruits Chest clear  bilat to bases, no rales/ wheezing RRR no RG Abd soft ntnd no mass or ascites +bs GU normal male MS no joint effusions or deformity Ext no LE or UE edema, no wounds or ulcers Neuro is alert, Ox 3 , nf    LUA AVF+bruit   Home meds include - albuterol, coreg 3.125 bid, diltiazem 360 qd, gabapentin 100 tts, hydralazine 25 qid, losartan 25 qd, singulair, renavit, oxycodone prn, pantoprazole, sevelamer carb 2 ac tid, prns/ vits/ supps   [facility] HD: [place] [facility]  3h 44mn  450/ 1.5  71kg  2/2 bath  P4  Hep none   LUA AVF - last HD 10/10, post wt 73.1kg - last Hb 5.0 on 10/10 - mircera 225 q2, last 10/5 - iron sucrose '50mg'$  q wk, last 10/5 - hectorol 2 ug IV tiw tts   Assessment/ Plan: GI bleed - Hb 4.7 here. Recurrent issue. Per pmd getting 2u prbc's, 2nd unit going in now. Per pmd.  Vol - CXR looks like early edema R side, plan HD this afternoon to get volume down.  ESRD - on HD TTS. Has not missed. HD today as above.  HTN - cont BP lowering meds here.  Anemia esrd - due for esa on 10/19. Transfuse prn. Cont weekly IV fe.  MBD ckd - CCa in range, cont binder ac, and IV vdra ttS.  Atrial fib - paroxysmal, per pmd      RKelly Splinter MD 07/27/2022, 11:01 AM Recent Labs  Lab 07/27/22 0409  HGB 4.7*  ALBUMIN 2.8*  CALCIUM 7.8*  CREATININE 8.28*  K 4.1   Inpatient medications:  carvedilol  3.125 mg Oral BID WC   Chlorhexidine Gluconate Cloth  6 each Topical Q0600   diltiazem  360 mg Oral Daily    fluticasone  2 spray Each Nare Daily   gabapentin  100 mg Oral Q T,Th,Sat-1800   hydrALAZINE  25 mg Oral Q6H   lidocaine-prilocaine   Topical Q T,Th,Sa-HD   losartan  25 mg Oral Daily   montelukast  10 mg Oral QHS   multivitamin  1 tablet Oral QHS   pantoprazole  40 mg Oral BID   [START ON 07/30/2022] pantoprazole  40 mg Intravenous Q12H   sevelamer carbonate  1,600 mg Oral TID WC    pantoprazole 8 mg/hr (07/27/22 0856)   acetaminophen **OR** acetaminophen, albuterol, magnesium hydroxide, ondansetron **OR** ondansetron (ZOFRAN) IV, oxyCODONE, traZODone

## 2022-07-27 NOTE — Assessment & Plan Note (Deleted)
-   The patient has subsequent acute blood loss symptomatic anemia. - He will be admitted to a progressive unit bed. - He was typed and crossmatched will be transfused 2 units of packed red blood cells. - We will follow posttransfusion H&H. - We will follow serial H&H. - GI consult will be obtained. - [facility] will be called to be aware about the patient.  The patient was seen before by Dr. Loletha Carrow and Dr. Tarri Glenn. - We will place him for now on IV Protonix with bolus and infusion.

## 2022-07-27 NOTE — ED Notes (Signed)
Patient placement called about getting pt an assigned bed after being dialyzed.  Robin in patient placement aware.  No new orders at this time.

## 2022-07-27 NOTE — Consult Note (Addendum)
Consultation Note   Referring Provider: [facility] PCP: [name], MD Primary Gastroenterologist: Althia Forts  Reason for consultation: anemia, heme positive stool  Hospital Day: 2  Assessment / Plan   # 61 yo male with recurrent acute on chronic anemia / FOBT+ stools. Hgb in 5 range at dialysis  and  4.7 in ED, down from 7.4 on 9/28. No overt GI bleeding. History of bleeding duodenal ulcer requiring embolization in January 2023. Recurrent GI bleed Sept 2023 with no source seen on EGD x2 / colonoscopy x2 except for a focal area of duodenal oozing proximal to the ampulla on EGD 9/20 which had resolved on repeat EGD 9/27. Cause of recurrent decline in hgb unclear. Possibly small bowel AVM?  May need small bowel enteroscopy, ? Tomorrow. Need to get hgb up. Also, he is going for dialysis today.  He can eat today, NPO after MN.  Await post-transfusion H+H On PPI infusion, probably BID would suffice in absence of overt bleeding but will leave in place for now. Adjustment if needed can follow EGD  # Remote history of colon cancer, s/p R hemicolectomy. No recurrent cancers on recent colonoscopy   See PMH for additional medical problems   HPI   Timothy [name] is a 61 y.o. male with a past medical history significant for  ESRD on HD TTS, eradicated HCV, thoracici aortic aneurysm, CHF, chronic anemia, bleeding duodenal ulcer requiring embolization, colon cancer in 2014, s/p R hemicolectomy.  See PMH for any additional medical problems.  Patient has had previous admissions for upper GI bleeding with ABL anemia. He was admitted in January 2023 with bleeding from a large bleeding DU which required GDA embolization. We last saw him for inpatient consultation on 07/03/22 for GI bleeding. He underwent EGD / colonoscopy. EGD showed suspected coils eroding through the duodenum at the site of prior embolization. Not actively bleeding but there was  cooozing at ampulla. Colonoscopy - poor prep. CTA to evaluate the duodenal bleeding actually showed a small focus of intraluminal contrast accumulation at the splenic flexure of the colon. He underwent repeat EGD / colonoscopy a few days later, see reports below. No source of bleeding found.   Patient presented to ED this am for evaluation of low hgb at dialysis. No overt GI bleeding. ED Evaluation:  Hypertensive.  Hgb 4.7, down from 7.4 on 9/27.  FOBT + Borderline prolonged QTc - 484 ms     Getting 1 of 2 units of PRBCs now. He hasn't had any dark stools or seen any blood in stool. Says stools are brown. No abdominal pain. No N/V. He feels okay, just hungry and wants to eat. Denies NSAID use. Says he is not taking Pantoprazole.   Previous GI Evaluation     ** Most recent endoscopies:   11/05/2021 EGD by Dr. Benson Norway.  Large, bleeding duodenal ulcer treated with Hemospray followed by GDA embolization by IR. H.pylori IgG was normal range  11/14/2021 EGD showing small HH, antral erythema, nonbleeding duodenal ulcer with adherent clot.  Advised to continue PPI bid, Carafate 4 times daily.  11/16/2021 EGD.  Clotted blood seen in gastric body.  Nonobstructing duodenal ulcer with spurting hemorrhage was treated with epinephrine, 3 endoclips, bipolar cautery without success ultimately  treated with Hemospray  07/05/22 EGD  - Normal esophagus. - Small hiatal hernia. - Normal stomach. - Suspected coils eroding through the duodenum at the site of prior embolization. Not actively bleeding around the coils. - Focal area of oozing proximal to the ampulla. Biopsied. Clipped to stop bleeding. Hemostatic spray applied.  07/05/22 Colonoscopy -Preparation of the colon was poor with stool present in the entire examined colon. - Patent end-to-end ileo-colonic anastomosis. - No obvious source of anemia identified on this study.  07/12/22 EGD --normal esophagus, normal stomach - Duodenal foreign body.  07/12/22  Colonoscopy -CTA suggested splenic flexure source of bleeding --Prep only good to fair - Decreased sphincter tone found on digital rectal exam. - Patent side-to-side ileo-colonic anastomosis, characterized by healthy appearing mucosa. - The examined portion of the ileum was normal. - A few ulcers in the mid transverse colon. - Tortuous colon. - The examination was otherwise normal on direct and retroflexion views. - No specimens collected.  Recent Labs and Imaging DG Chest Port 1 View  Result Date: 07/27/2022 CLINICAL DATA:  61 year old male with weakness. EXAM: PORTABLE CHEST 1 VIEW COMPARISON:  CT Abdomen and Pelvis 07/05/2022 and earlier. FINDINGS: Portable AP supine view at 0352 hours. Streaky, linear atelectasis or scarring at the right lower lung does not appear significantly changed from last month. Borderline to mild cardiomegaly. Calcified aortic atherosclerosis. Stable cardiac size and mediastinal contours. Visualized tracheal air column is within normal limits. Difficult to exclude asymmetric increased background interstitial opacity in the right lung, but favor artifact instead (the right chest soft tissues also appear denser). No pneumothorax, pleural effusion or consolidation. IMPRESSION: 1. Difficult to exclude asymmetric right lung interstitial opacity or edema, but favor artifact due to portable technique instead. 2. Otherwise stable atelectasis or scarring in the right lower lung and mild cardiomegaly. Electronically Signed   By: Genevie Ann M.D.   On: 07/27/2022 04:25   CT ANGIO GI BLEED  Result Date: 07/05/2022 CLINICAL DATA:  Lower gastrointestinal bleeding EXAM: CTA ABDOMEN AND PELVIS WITHOUT AND WITH CONTRAST TECHNIQUE: Multidetector CT imaging of the abdomen and pelvis was performed using the standard protocol during bolus administration of intravenous contrast. Multiplanar reconstructed images and MIPs were obtained and reviewed to evaluate the vascular anatomy. RADIATION DOSE  REDUCTION: This exam was performed according to the departmental dose-optimization program which includes automated exposure control, adjustment of the mA and/or kV according to patient size and/or use of iterative reconstruction technique. CONTRAST:  39m OMNIPAQUE IOHEXOL 350 MG/ML SOLN COMPARISON:  02/14/2022 FINDINGS: VASCULAR Aorta: Normal caliber aorta without aneurysm, dissection, vasculitis or significant stenosis. Diffuse atherosclerosis. Celiac: Patent without evidence of aneurysm, dissection, vasculitis or significant stenosis. Embolic coils in the distribution of the gastroduodenal artery. SMA: Patent without evidence of aneurysm, dissection, vasculitis or significant stenosis. Renals: Both renal arteries are patent. Atherosclerosis bilaterally, left greater than right. Stenosis approaching 50% within the proximal left renal artery. Less than 50% stenosis on the right. No aneurysm, dissection, or vasculitis. IMA: Patent without evidence of aneurysm, dissection, vasculitis or significant stenosis. Inflow: Patent without evidence of aneurysm, dissection, vasculitis or significant stenosis. Diffuse atherosclerosis. Proximal Outflow: Bilateral common femoral and visualized portions of the superficial and profunda femoral arteries are patent without evidence of aneurysm, dissection, vasculitis or significant stenosis. Diffuse atherosclerosis. Veins: No obvious venous abnormality within the limitations of this arterial phase study. Review of the MIP images confirms the above findings. NON-VASCULAR Lower chest: No acute pleural or parenchymal lung disease. The heart is enlarged  without pericardial effusion. Hepatobiliary: Cholelithiasis without cholecystitis. The liver is unremarkable. No biliary duct dilation. Pancreas: Unremarkable. No pancreatic ductal dilatation or surrounding inflammatory changes. Spleen: 2.2 cm splenic cyst.  Spleen is not enlarged. Adrenals/Urinary Tract: Innumerable bilateral renal  cysts and underlying cortical atrophy consistent with end-stage renal disease. There is a 3.9 x 3.6 cm heterogeneous lesion within the right kidney, measuring 43 HU on precontrast imaging, 40 HU on arterial phase imaging, and 47 HU on delayed imaging. While this has increased in size since 2022, the lack of significant enhancement and previous MR imaging characteristics are most consistent with proteinaceous or hemorrhagic cyst. The adrenals and bladder are grossly unremarkable. Stomach/Bowel: No bowel obstruction or ileus. Clips are seen within the second portion duodenum from prior endoscopy. Postsurgical changes from right hemicolectomy and reanastomosis. There is minimal intraluminal contrast accumulation only seen on delayed imaging at the splenic flexure of the colon, reference image 29/15 and image 88/20, consistent with small area of acute gastrointestinal bleeding. No bowel wall thickening or inflammatory change. Lymphatic: No pathologic adenopathy within the abdomen or pelvis. Reproductive: Prostate is unremarkable. Other: Moderate ascites throughout the upper abdomen, with multiple synechiae throughout the peritoneal fluid. No free intraperitoneal gas. No abdominal wall hernia. Musculoskeletal: No acute or destructive bony lesions. Reconstructed images demonstrate no additional findings. IMPRESSION: VASCULAR 1. 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 2. No evidence of active gastrointestinal bleeding within the duodenum near the site of recent hemostatic clip placement and prior arterial embolization. 3.  Aortic Atherosclerosis (ICD10-I70.0). NON-VASCULAR 1. Moderate volume complex ascites. 2. Cholelithiasis without cholecystitis. 3. Enlarging nonenhancing hyperdense lesion right kidney, previously shown to represent a  proteinaceous or hemorrhagic cyst. 4. Cardiomegaly. Critical Value/emergent results were called by telephone at the time of interpretation on 07/05/2022 at 8:47 pm to provider [facility], who verbally acknowledged these results. Based on endoscopy performed earlier today, the clinical concern was for upper gastrointestinal bleeding near the site of duodenal hemostatic clip placement. The small hemorrhage at the splenic flexure may reflect incidental hemorrhage due to angio dysplasia. Electronically Signed   By: Randa Ngo M.D.   On: 07/05/2022 20:57   DG Chest 2 View  Result Date: 07/03/2022 CLINICAL DATA:  Shortness of breath EXAM: CHEST - 2 VIEW COMPARISON:  Previous studies including the examination of 04/07/2022 FINDINGS: Transverse diameter of heart is increased. Central pulmonary vessels are prominent. There are no signs of alveolar pulmonary edema. Linear densities in right lower lung fields suggest scarring. There is no new focal pulmonary consolidation. There is no pleural effusion or pneumothorax. IMPRESSION: Cardiomegaly. Central pulmonary vessels are prominent without signs of pulmonary edema. Linear densities in right lower lung field have not changed, possibly scarring. No new focal pulmonary consolidation is seen. Electronically Signed   By: Elmer Picker M.D.   On: 07/03/2022 13:26    Labs:  Recent Labs    07/27/22 0409  WBC 7.0  HGB 4.7*  HCT 15.8*  PLT 319   Recent Labs    07/27/22 0409  NA 144  K 4.1  CL 97*  CO2 29  GLUCOSE 93  BUN 70*  CREATININE 8.28*  CALCIUM 7.8*   Recent Labs    07/27/22 0409  PROT 6.9  ALBUMIN 2.8*  AST 25  ALT 17  ALKPHOS 104  BILITOT 0.6  BILIDIR 0.2  IBILI 0.4    Vitamin B12 1,055 in February 2023  No results for input(s): "HEPBSAG", "HCVAB", "HEPAIGM", "HEPBIGM" in the last 72 hours. No results for input(s): "LABPROT", "INR" in the last 72 hours.  Past Medical History:  Diagnosis Date   Acute metabolic  encephalopathy 84/16/6063   Anemia of chronic kidney failure    BPH (benign prostatic hyperplasia)    Colon cancer [facility]) 2014   spouse states he had surgury for colon CA   End stage renal disease on dialysis [facility]) 2017   TTHSat   Hepatitis C    treated   Hypertension    Hypertensive crisis 04/07/2022   Hypertensive heart disease with chronic diastolic congestive heart failure ([place]) 07/17/2016   Polysubstance abuse ([place])    History of heroin and marijuana use   Thoracic aortic aneurysm ([place])    followed by Dr. Ellyn Hack    Past Surgical History:  Procedure Laterality Date   A/V FISTULAGRAM Left 12/02/2021   Procedure: A/V Fistulagram;  Surgeon: Cherre Robins, MD;  Location: [place] CV LAB;  Service: Cardiovascular;  Laterality: Left;   A/V FISTULAGRAM Left 04/24/2022   Procedure: A/V Fistulagram;  Surgeon: Waynetta Sandy, MD;  Location: [place] CV LAB;  Service: Cardiovascular;  Laterality: Left;   ABDOMINAL SURGERY     AV FISTULA PLACEMENT Left 09/19/2016   Procedure: Left arm Radiocephalic ARTERIOVENOUS (AV) FISTULA CREATION;  Surgeon: Conrad Houston, MD;  Location: [place];  Service: Vascular;  Laterality: Left;   BASCILIC VEIN TRANSPOSITION Left 07/09/2017   Procedure: BRACHIOCEPHALIC FISTULA CREATION;  Surgeon: Conrad Steele, MD;  Location: [place];  Service: Vascular;  Laterality: Left;   BIOPSY  07/05/2022   Procedure: BIOPSY;  Surgeon: Thornton Park, MD;  Location: [place];  Service: Gastroenterology;;   COLON SURGERY  2014   COLONOSCOPY N/A 07/12/2022   Procedure: COLONOSCOPY;  Surgeon: Doran Stabler, MD;  Location: [place];  Service: Gastroenterology;  Laterality: N/A;   COLONOSCOPY WITH PROPOFOL N/A 07/05/2022   Procedure: COLONOSCOPY WITH PROPOFOL;  Surgeon: Thornton Park, MD;  Location: [place];  Service: Gastroenterology;  Laterality: N/A;   ESOPHAGOGASTRODUODENOSCOPY (EGD) WITH PROPOFOL N/A 11/05/2021   Procedure:  ESOPHAGOGASTRODUODENOSCOPY (EGD) WITH PROPOFOL;  Surgeon: Carol Ada, MD;  Location: [place];  Service: Endoscopy;  Laterality: N/A;   ESOPHAGOGASTRODUODENOSCOPY (EGD) WITH PROPOFOL N/A 11/14/2021   Procedure: ESOPHAGOGASTRODUODENOSCOPY (EGD) WITH PROPOFOL;  Surgeon: [name], MD;  Location: [place];  Service: Gastroenterology;  Laterality: N/A;   ESOPHAGOGASTRODUODENOSCOPY (EGD) WITH PROPOFOL N/A 11/16/2021   Procedure: ESOPHAGOGASTRODUODENOSCOPY (EGD) WITH PROPOFOL;  Surgeon: [name], MD;  Location: [place];  Service: Gastroenterology;  Laterality: N/A;   ESOPHAGOGASTRODUODENOSCOPY (EGD) WITH PROPOFOL N/A 07/05/2022   Procedure: ESOPHAGOGASTRODUODENOSCOPY (EGD) WITH PROPOFOL;  Surgeon: Thornton Park, MD;  Location: [place];  Service: Gastroenterology;  Laterality: N/A;   ESOPHAGOGASTRODUODENOSCOPY (EGD) WITH PROPOFOL N/A 07/12/2022   Procedure: ESOPHAGOGASTRODUODENOSCOPY (EGD) WITH PROPOFOL;  Surgeon: Doran Stabler, MD;  Location: [place];  Service: Gastroenterology;  Laterality: N/A;   HEMOSTASIS CLIP PLACEMENT  11/16/2021   Procedure: HEMOSTASIS CLIP PLACEMENT;  Surgeon: [name], MD;  Location: [facility] ENDOSCOPY;  Service: Gastroenterology;;   HEMOSTASIS CLIP PLACEMENT  07/05/2022   Procedure: HEMOSTASIS CLIP PLACEMENT;  Surgeon: Thornton Park, MD;  Location: [facility] ENDOSCOPY;  Service: Gastroenterology;;   HEMOSTASIS CONTROL  11/05/2021   Procedure: HEMOSTASIS CONTROL;  Surgeon: Carol Ada, MD;  Location: [place];  Service: Endoscopy;;   HEMOSTASIS  CONTROL  11/16/2021   Procedure: HEMOSTASIS CONTROL;  Surgeon: [name], MD;  Location: [facility] ENDOSCOPY;  Service: Gastroenterology;;   HEMOSTASIS CONTROL  07/05/2022   Procedure: HEMOSTASIS CONTROL;  Surgeon: Thornton Park, MD;  Location: [place];  Service: Gastroenterology;;   HOT HEMOSTASIS N/A 11/16/2021   Procedure: HOT HEMOSTASIS (ARGON PLASMA COAGULATION/BICAP);  Surgeon: [name], MD;  Location: [place];  Service: Gastroenterology;  Laterality: N/A;   INSERTION OF DIALYSIS CATHETER N/A 09/19/2016   Procedure: INSERTION OF TUNNELED DIALYSIS CATHETER;  Surgeon: Conrad White Hall, MD;  Location: [place];  Service: Vascular;  Laterality: N/A;   IR ANGIOGRAM SELECTIVE EACH ADDITIONAL VESSEL  11/16/2021   IR ANGIOGRAM VISCERAL SELECTIVE  11/05/2021   IR ANGIOGRAM VISCERAL SELECTIVE  11/16/2021   IR ANGIOGRAM VISCERAL SELECTIVE  11/16/2021   IR EMBO ART  VEN HEMORR LYMPH EXTRAV  INC GUIDE ROADMAPPING  11/05/2021   IR EMBO ART  VEN HEMORR LYMPH EXTRAV  INC GUIDE ROADMAPPING  11/16/2021   IR GENERIC HISTORICAL  09/14/2016   IR US GUIDE VASC ACCESS RIGHT 09/14/2016 [name], DO [facility]   IR GENERIC HISTORICAL  09/14/2016   IR FLUORO GUIDE CV LINE RIGHT 09/14/2016 [name], DO [facility]   IR PARACENTESIS  06/22/2021   IR US GUIDE [place] RIGHT  11/05/2021   IR US GUIDE [place] RIGHT  11/16/2021   LAPAROTOMY N/A 05/23/2021   Procedure: EXPLORATORY LAPAROTOMY LYSIS ADHESIONS;  Surgeon: [name], MD;  Location: [place];  Service: General;  Laterality: N/A;   LAPAROTOMY N/A 11/20/2021   Procedure: EXPLORATORY LAPAROTOMY, REPAIR OF BLEEDING DUODENAL ULCER;  Surgeon: Dwan Bolt, MD;  Location: [place];  Service: General;  Laterality: N/A;   ORIF TIBIA PLATEAU Left 01/15/2018   Procedure: OPEN REDUCTION INTERNAL FIXATION (ORIF) TIBIAL PLATEAU;  Surgeon: Altamese Altamont, MD;  Location: [place];  Service: Orthopedics;  Laterality: Left;   REVISON OF ARTERIOVENOUS FISTULA Left 05/26/2022   Procedure: REVISION OF LEFT ARM ARTERIOVENOUS FISTULA WITH PLICATION OF PSEUDOANEURYSM;  Surgeon: Waynetta Sandy, MD;  Location: [place];  Service: Vascular;  Laterality: Left;   SCLEROTHERAPY  11/05/2021   Procedure: Clide Deutscher;  Surgeon: Carol Ada, MD;  Location: [place];  Service: Endoscopy;;   SCLEROTHERAPY  11/16/2021   Procedure: Clide Deutscher;  Surgeon:  [name], MD;  Location[facility] ENDOSCOPY;  Service: Gastroenterology;;   TRANSTHORACIC ECHOCARDIOGRAM  07/2016    EF 60-65%, No RWMA. Mod Concentric LVH - Gr 2 DD. Severe LA dilation. PAP ~35 mmHg (mild Pulm HTN)  --> no changes noted 1 month later    Family History  Problem Relation Age of Onset   Heart failure Mother        Died at age 1.   Heart attack Mother 60   Hypertension Mother    Diabetes Mellitus II Mother    Other Father        Unknown   Kidney failure Sister        (Oldest Sister)   Other Other        Multiple siblings have started her heart disease, he is not sure of the details.   CAD Nephew     Prior to Admission medications   Medication Sig Start Date End Date Taking? Authorizing Provider  albuterol (VENTOLIN HFA) 108 (90 Base) MCG/ACT inhaler Inhale 2 puffs into the lungs every 6 (six) hours as needed for wheezing or shortness of breath. 01/06/22  Yes [provider]  carvedilol (COREG) 3.125 MG tablet Take 3.125 mg by mouth 2 (two) times daily with a meal.   Yes [provider]  diltiazem (TIAZAC) 360 MG 24 hr capsule Take 360 mg by mouth in the morning.    [provider]  DULoxetine (CYMBALTA) 20 MG capsule Take 20 mg by mouth daily. Patient not taking: Reported on 07/03/2022 01/06/22   [provider]  fluticasone (FLONASE) 50 MCG/ACT nasal spray Place 2 sprays into both nostrils daily. 06/26/22   [provider]  gabapentin (NEURONTIN) 100 MG capsule Take 1 capsule (100 mg total) by mouth every Tuesday, Thursday, and Saturday at 6 PM. 07/13/22   Florencia Reasons, MD  hydrALAZINE (APRESOLINE) 25 MG tablet Take 1 tablet (25 mg total) by mouth every 6 (six) hours. 03/15/22   Edwin Dada, MD  LIDOCAINE-PRILOCAINE EX Apply 1 application  topically Every Tuesday,Thursday,and Saturday with dialysis.    [provider]  losartan (COZAAR) 25 MG tablet Take 25 mg by mouth daily. 06/26/22   [provider]   montelukast (SINGULAIR) 10 MG tablet Take 10 mg by mouth at bedtime. 06/26/22   [provider]  multivitamin (RENA-VIT) TABS tablet Take 1 tablet by mouth at bedtime. 03/15/22   Danford, Suann Larry, MD  oxyCODONE (OXY IR/ROXICODONE) 5 MG immediate release tablet Take 5 mg by mouth every 8 (eight) hours as needed for pain or severe pain. 12/09/21   [provider]  pantoprazole (PROTONIX) 40 MG tablet Take 1 tablet (40 mg total) by mouth 2 (two) times daily. 07/12/22   Florencia Reasons, MD  sevelamer carbonate (RENVELA) 800 MG tablet Take 1,600 mg by mouth 3 (three) times daily with meals. Patient not taking: Reported on 07/03/2022 11/14/21   [provider]    Current Facility-Administered Medications  Medication Dose Route Frequency Provider Last Rate Last Admin   acetaminophen (TYLENOL) tablet 650 mg  650 mg Oral Q6H PRN Mansy, Jan A, MD       Or   acetaminophen (TYLENOL) suppository 650 mg  650 mg Rectal Q6H PRN Mansy, Jan A, MD       albuterol (PROVENTIL) (2.5 MG/3ML) 0.083% nebulizer solution 2.5 mg  2.5 mg Inhalation Q6H PRN Mansy, Jan A, MD       carvedilol (COREG) tablet 3.125 mg  3.125 mg Oral BID WC Mansy, Jan A, MD   3.125 mg at 07/27/22 4174   diltiazem (CARDIZEM CD) 24 hr capsule 360 mg  360 mg Oral Daily Mansy, Jan A, MD       fluticasone (FLONASE) 50 MCG/ACT nasal spray 2 spray  2 spray Each Nare Daily Mansy, Jan A, MD       gabapentin (NEURONTIN) capsule 100 mg  100 mg Oral Q T,Th,Sat-1800 Mansy, Jan A, MD       hydrALAZINE (APRESOLINE) tablet 25 mg  25 mg Oral Q6H Mansy, Jan A, MD   25 mg at 07/27/22 0555   lidocaine-prilocaine (EMLA) cream   Topical Q T,Th,Sa-HD Mansy, Jan A, MD       losartan (COZAAR) tablet 25 mg  25 mg Oral Daily Mansy, Jan A, MD       magnesium hydroxide (MILK OF MAGNESIA) suspension 30 mL  30 mL Oral Daily PRN Mansy, Jan A, MD       montelukast (SINGULAIR) tablet 10 mg  10 mg Oral QHS Mansy, Jan A, MD       multivitamin (RENA-VIT)  tablet 1 tablet  1 tablet Oral  QHS Mansy, Jan A, MD       ondansetron [facility]) tablet 4 mg  4 mg Oral Q6H PRN Mansy, Jan A, MD       Or   ondansetron [facility]) injection 4 mg  4 mg Intravenous Q6H PRN Mansy, Jan A, MD       oxyCODONE (Oxy IR/ROXICODONE) immediate release tablet 5 mg  5 mg Oral Q8H PRN Mansy, Jan A, MD   5 mg at 07/27/22 0824   pantoprazole (PROTONIX) EC tablet 40 mg  40 mg Oral BID Mansy, Jan A, MD       [START ON 07/30/2022] pantoprazole (PROTONIX) injection 40 mg  40 mg Intravenous Q12H Mansy, Jan A, MD       pantoprozole (PROTONIX) 80 mg /NS 100 mL infusion  8 mg/hr Intravenous Continuous Mansy, Jan A, MD 10 mL/hr at 07/27/22 0856 8 mg/hr at 07/27/22 0856   sevelamer carbonate (RENVELA) tablet 1,600 mg  1,600 mg Oral TID WC Mansy, Jan A, MD   1,600 mg at 07/27/22 9767   traZODone (DESYREL) tablet 25 mg  25 mg Oral QHS PRN Mansy, Arvella Merles, MD       Current Outpatient Medications  Medication Sig Dispense Refill   albuterol (VENTOLIN HFA) 108 (90 Base) MCG/ACT inhaler Inhale 2 puffs into the lungs every 6 (six) hours as needed for wheezing or shortness of breath.     carvedilol (COREG) 3.125 MG tablet Take 3.125 mg by mouth 2 (two) times daily with a meal.     diltiazem (TIAZAC) 360 MG 24 hr capsule Take 360 mg by mouth in the morning.     DULoxetine (CYMBALTA) 20 MG capsule Take 20 mg by mouth daily. (Patient not taking: Reported on 07/03/2022)     fluticasone (FLONASE) 50 MCG/ACT nasal spray Place 2 sprays into both nostrils daily.     gabapentin (NEURONTIN) 100 MG capsule Take 1 capsule (100 mg total) by mouth every Tuesday, Thursday, and Saturday at 6 PM. 30 capsule 0   hydrALAZINE (APRESOLINE) 25 MG tablet Take 1 tablet (25 mg total) by mouth every 6 (six) hours. 90 tablet 3   LIDOCAINE-PRILOCAINE EX Apply 1 application  topically Every Tuesday,Thursday,and Saturday with dialysis.     losartan (COZAAR) 25 MG tablet Take 25 mg by mouth daily.     montelukast (SINGULAIR) 10 MG  tablet Take 10 mg by mouth at bedtime.     multivitamin (RENA-VIT) TABS tablet Take 1 tablet by mouth at bedtime. 30 tablet 3   oxyCODONE (OXY IR/ROXICODONE) 5 MG immediate release tablet Take 5 mg by mouth every 8 (eight) hours as needed for pain or severe pain.     pantoprazole (PROTONIX) 40 MG tablet Take 1 tablet (40 mg total) by mouth 2 (two) times daily. 60 tablet 0   sevelamer carbonate (RENVELA) 800 MG tablet Take 1,600 mg by mouth 3 (three) times daily with meals. (Patient not taking: Reported on 07/03/2022)      Allergies as of 07/26/2022   (No Known Allergies)    Social History   Socioeconomic History   Marital status: Married    Spouse name: Not on file   Number of children: Not on file   Years of education: Not on file   Highest education level: Not on file  Occupational History   Occupation: disabled  Tobacco Use   Smoking status: Former    Packs/day: 0.25    Types: Cigarettes    Quit date: 05/12/2022    Years  since quitting: 0.2    Passive exposure: Never   Smokeless tobacco: Never  Vaping Use   Vaping Use: Never used  Substance and Sexual Activity   Alcohol use: No    Alcohol/week: 7.0 standard drinks of alcohol    Types: 7 Cans of beer per week   Drug use: Yes    Frequency: 1.0 times per week    Types: Marijuana, Heroin    Comment: last time 05/25/22   Sexual activity: Yes    Comment: once a week  Other Topics Concern   Not on file  Social History Narrative   Not on file   Social Determinants of Health   Financial Resource Strain: Not on file  Food Insecurity: No Food Insecurity (07/03/2022)   Hunger Vital Sign    Worried About Running Out of Food in the Last Year: Never true    Ran Out of Food in the Last Year: Never true  Transportation Needs: No Transportation Needs (07/03/2022)   PRAPARE - Hydrologist (Medical): No    Lack of Transportation (Non-Medical): No  Physical Activity: Not on file  Stress: Not on file   Social Connections: Not on file  Intimate Partner Violence: Not At Risk (07/03/2022)   Humiliation, Afraid, Rape, and Kick questionnaire    Fear of Current or Ex-Partner: No    Emotionally Abused: No    Physically Abused: No    Sexually Abused: No    Review of Systems: Positive for burning sensation in both feet. All systems reviewed and negative except where noted in HPI.  Physical Exam: Vital signs in last 24 hours: Temp:  [97.3 F (36.3 C)-98.1 F (36.7 C)] 97.3 F (36.3 C) (10/12 0856) Pulse Rate:  [83-95] 91 (10/12 0856) Resp:  [15-24] 21 (10/12 0856) BP: (146-202)/(80-111) 167/105 (10/12 0856) SpO2:  [94 %-100 %] 96 % (10/12 0856) Weight:  [74 kg] 74 kg (10/11 1854)    General:  Alert male in NAD Psych:  cooperative. Normal mood and affect Eyes: Pupils equal, no icterus. Conjunctive pink Ears:  Normal auditory acuity Nose: No deformity, discharge or lesions Neck:  Supple, no masses felt Lungs:  Clear to auscultation.  Heart:  Regular rate, regular rhythm. No lower extremity edema Abdomen:  Soft, nondistended, nontender, active bowel sounds, no masses felt Rectal :  Deferred Msk: Symmetrical without gross deformities.  Neurologic:  Alert, oriented, grossly normal neurologically Skin:  Intact without significant lesions.    Intake/Output from previous day: No intake/output data recorded. Intake/Output this shift:  Total I/O In: 315 [Blood:315] Out: -     Principal Problem:   GI bleeding Active Problems:   Acute on chronic combined systolic and diastolic CHF (congestive heart failure) (HCC)   HTN (hypertension)   ESRD on HD TTS   Paroxysmal atrial fibrillation (HCC)    Tye Savoy, NP-C @  07/27/2022, 9:14 AM

## 2022-07-27 NOTE — Progress Notes (Signed)
Received patient in bed to unit.  Alert and oriented.  Informed consent signed and in chart.   Treatment initiated: 1522 Treatment completed: 1853  Patient tolerated well.  Transported back to the room  Alert, without acute distress.  Hand-off given to patient's nurse.   Access used: AVF Access issues: NA  Total UF removed: 3441m Medication(s) given: Hectoroal 233m IVP Post HD VS:  97.9    191/124    84    16    100% RA Post HD weight: UTA d/t pt on ED stretcher   [facility]

## 2022-07-27 NOTE — Assessment & Plan Note (Deleted)
-   The patient will be admitted to a stepdown unit bed. - He was typed and crossmatched will be transfused 2 units of packed red blood cells. - We will follow posttransfusion H&H. - We will follow serial H&H. - GI consult will be obtained. - [facility] will be called to be aware about the patient.  The patient was seen before by Dr. Loletha Carrow and Dr. Tarri Glenn. - We will place him for now on IV Protonix with bolus and infusion.

## 2022-07-27 NOTE — ED Notes (Signed)
Pt transported to dialysis

## 2022-07-27 NOTE — H&P (View-Only) (Signed)
Consultation Note   Referring Provider: [facility] PCP: [name], MD Primary Gastroenterologist: Althia Forts  Reason for consultation: anemia, heme positive stool  Hospital Day: 2  Assessment / Plan   # 61 yo male with recurrent acute on chronic anemia / FOBT+ stools. Hgb in 5 range at dialysis  and  4.7 in ED, down from 7.4 on 9/28. No overt GI bleeding. History of bleeding duodenal ulcer requiring embolization in January 2023. Recurrent GI bleed Sept 2023 with no source seen on EGD x2 / colonoscopy x2 except for a focal area of duodenal oozing proximal to the ampulla on EGD 9/20 which had resolved on repeat EGD 9/27. Cause of recurrent decline in hgb unclear. Possibly small bowel AVM?  May need small bowel enteroscopy, ? Tomorrow. Need to get hgb up. Also, he is going for dialysis today.  He can eat today, NPO after MN.  Await post-transfusion H+H On PPI infusion, probably BID would suffice in absence of overt bleeding but will leave in place for now. Adjustment if needed can follow EGD  # Remote history of colon cancer, s/p R hemicolectomy. No recurrent cancers on recent colonoscopy   See PMH for additional medical problems   HPI   Timothy [name] is a 61 y.o. male with a past medical history significant for  ESRD on HD TTS, eradicated HCV, thoracici aortic aneurysm, CHF, chronic anemia, bleeding duodenal ulcer requiring embolization, colon cancer in 2014, s/p R hemicolectomy.  See PMH for any additional medical problems.  Patient has had previous admissions for upper GI bleeding with ABL anemia. He was admitted in January 2023 with bleeding from a large bleeding DU which required GDA embolization. We last saw him for inpatient consultation on 07/03/22 for GI bleeding. He underwent EGD / colonoscopy. EGD showed suspected coils eroding through the duodenum at the site of prior embolization. Not actively bleeding but there was  cooozing at ampulla. Colonoscopy - poor prep. CTA to evaluate the duodenal bleeding actually showed a small focus of intraluminal contrast accumulation at the splenic flexure of the colon. He underwent repeat EGD / colonoscopy a few days later, see reports below. No source of bleeding found.   Patient presented to ED this am for evaluation of low hgb at dialysis. No overt GI bleeding. ED Evaluation:  Hypertensive.  Hgb 4.7, down from 7.4 on 9/27.  FOBT + Borderline prolonged QTc - 484 ms     Getting 1 of 2 units of PRBCs now. He hasn't had any dark stools or seen any blood in stool. Says stools are brown. No abdominal pain. No N/V. He feels okay, just hungry and wants to eat. Denies NSAID use. Says he is not taking Pantoprazole.   Previous GI Evaluation     ** Most recent endoscopies:   11/05/2021 EGD by Dr. Benson Norway.  Large, bleeding duodenal ulcer treated with Hemospray followed by GDA embolization by IR. H.pylori IgG was normal range  11/14/2021 EGD showing small HH, antral erythema, nonbleeding duodenal ulcer with adherent clot.  Advised to continue PPI bid, Carafate 4 times daily.  11/16/2021 EGD.  Clotted blood seen in gastric body.  Nonobstructing duodenal ulcer with spurting hemorrhage was treated with epinephrine, 3 endoclips, bipolar cautery without success ultimately  treated with Hemospray  07/05/22 EGD  - Normal esophagus. - Small hiatal hernia. - Normal stomach. - Suspected coils eroding through the duodenum at the site of prior embolization. Not actively bleeding around the coils. - Focal area of oozing proximal to the ampulla. Biopsied. Clipped to stop bleeding. Hemostatic spray applied.  07/05/22 Colonoscopy -Preparation of the colon was poor with stool present in the entire examined colon. - Patent end-to-end ileo-colonic anastomosis. - No obvious source of anemia identified on this study.  07/12/22 EGD --normal esophagus, normal stomach - Duodenal foreign body.  07/12/22  Colonoscopy -CTA suggested splenic flexure source of bleeding --Prep only good to fair - Decreased sphincter tone found on digital rectal exam. - Patent side-to-side ileo-colonic anastomosis, characterized by healthy appearing mucosa. - The examined portion of the ileum was normal. - A few ulcers in the mid transverse colon. - Tortuous colon. - The examination was otherwise normal on direct and retroflexion views. - No specimens collected.  Recent Labs and Imaging DG Chest Port 1 View  Result Date: 07/27/2022 CLINICAL DATA:  61 year old male with weakness. EXAM: PORTABLE CHEST 1 VIEW COMPARISON:  CT Abdomen and Pelvis 07/05/2022 and earlier. FINDINGS: Portable AP supine view at 0352 hours. Streaky, linear atelectasis or scarring at the right lower lung does not appear significantly changed from last month. Borderline to mild cardiomegaly. Calcified aortic atherosclerosis. Stable cardiac size and mediastinal contours. Visualized tracheal air column is within normal limits. Difficult to exclude asymmetric increased background interstitial opacity in the right lung, but favor artifact instead (the right chest soft tissues also appear denser). No pneumothorax, pleural effusion or consolidation. IMPRESSION: 1. Difficult to exclude asymmetric right lung interstitial opacity or edema, but favor artifact due to portable technique instead. 2. Otherwise stable atelectasis or scarring in the right lower lung and mild cardiomegaly. Electronically Signed   By: Genevie Ann M.D.   On: 07/27/2022 04:25   CT ANGIO GI BLEED  Result Date: 07/05/2022 CLINICAL DATA:  Lower gastrointestinal bleeding EXAM: CTA ABDOMEN AND PELVIS WITHOUT AND WITH CONTRAST TECHNIQUE: Multidetector CT imaging of the abdomen and pelvis was performed using the standard protocol during bolus administration of intravenous contrast. Multiplanar reconstructed images and MIPs were obtained and reviewed to evaluate the vascular anatomy. RADIATION DOSE  REDUCTION: This exam was performed according to the departmental dose-optimization program which includes automated exposure control, adjustment of the mA and/or kV according to patient size and/or use of iterative reconstruction technique. CONTRAST:  62m OMNIPAQUE IOHEXOL 350 MG/ML SOLN COMPARISON:  02/14/2022 FINDINGS: VASCULAR Aorta: Normal caliber aorta without aneurysm, dissection, vasculitis or significant stenosis. Diffuse atherosclerosis. Celiac: Patent without evidence of aneurysm, dissection, vasculitis or significant stenosis. Embolic coils in the distribution of the gastroduodenal artery. SMA: Patent without evidence of aneurysm, dissection, vasculitis or significant stenosis. Renals: Both renal arteries are patent. Atherosclerosis bilaterally, left greater than right. Stenosis approaching 50% within the proximal left renal artery. Less than 50% stenosis on the right. No aneurysm, dissection, or vasculitis. IMA: Patent without evidence of aneurysm, dissection, vasculitis or significant stenosis. Inflow: Patent without evidence of aneurysm, dissection, vasculitis or significant stenosis. Diffuse atherosclerosis. Proximal Outflow: Bilateral common femoral and visualized portions of the superficial and profunda femoral arteries are patent without evidence of aneurysm, dissection, vasculitis or significant stenosis. Diffuse atherosclerosis. Veins: No obvious venous abnormality within the limitations of this arterial phase study. Review of the MIP images confirms the above findings. NON-VASCULAR Lower chest: No acute pleural or parenchymal lung disease. The heart is enlarged  without pericardial effusion. Hepatobiliary: Cholelithiasis without cholecystitis. The liver is unremarkable. No biliary duct dilation. Pancreas: Unremarkable. No pancreatic ductal dilatation or surrounding inflammatory changes. Spleen: 2.2 cm splenic cyst.  Spleen is not enlarged. Adrenals/Urinary Tract: Innumerable bilateral renal  cysts and underlying cortical atrophy consistent with end-stage renal disease. There is a 3.9 x 3.6 cm heterogeneous lesion within the right kidney, measuring 43 HU on precontrast imaging, 40 HU on arterial phase imaging, and 47 HU on delayed imaging. While this has increased in size since 2022, the lack of significant enhancement and previous MR imaging characteristics are most consistent with proteinaceous or hemorrhagic cyst. The adrenals and bladder are grossly unremarkable. Stomach/Bowel: No bowel obstruction or ileus. Clips are seen within the second portion duodenum from prior endoscopy. Postsurgical changes from right hemicolectomy and reanastomosis. There is minimal intraluminal contrast accumulation only seen on delayed imaging at the splenic flexure of the colon, reference image 29/15 and image 88/20, consistent with small area of acute gastrointestinal bleeding. No bowel wall thickening or inflammatory change. Lymphatic: No pathologic adenopathy within the abdomen or pelvis. Reproductive: Prostate is unremarkable. Other: Moderate ascites throughout the upper abdomen, with multiple synechiae throughout the peritoneal fluid. No free intraperitoneal gas. No abdominal wall hernia. Musculoskeletal: No acute or destructive bony lesions. Reconstructed images demonstrate no additional findings. IMPRESSION: VASCULAR 1. 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 2. No evidence of active gastrointestinal bleeding within the duodenum near the site of recent hemostatic clip placement and prior arterial embolization. 3.  Aortic Atherosclerosis (ICD10-I70.0). NON-VASCULAR 1. Moderate volume complex ascites. 2. Cholelithiasis without cholecystitis. 3. Enlarging nonenhancing hyperdense lesion right kidney, previously shown to represent a  proteinaceous or hemorrhagic cyst. 4. Cardiomegaly. Critical Value/emergent results were called by telephone at the time of interpretation on 07/05/2022 at 8:47 pm to provider [facility], who verbally acknowledged these results. Based on endoscopy performed earlier today, the clinical concern was for upper gastrointestinal bleeding near the site of duodenal hemostatic clip placement. The small hemorrhage at the splenic flexure may reflect incidental hemorrhage due to angio dysplasia. Electronically Signed   By: Randa Ngo M.D.   On: 07/05/2022 20:57   DG Chest 2 View  Result Date: 07/03/2022 CLINICAL DATA:  Shortness of breath EXAM: CHEST - 2 VIEW COMPARISON:  Previous studies including the examination of 04/07/2022 FINDINGS: Transverse diameter of heart is increased. Central pulmonary vessels are prominent. There are no signs of alveolar pulmonary edema. Linear densities in right lower lung fields suggest scarring. There is no new focal pulmonary consolidation. There is no pleural effusion or pneumothorax. IMPRESSION: Cardiomegaly. Central pulmonary vessels are prominent without signs of pulmonary edema. Linear densities in right lower lung field have not changed, possibly scarring. No new focal pulmonary consolidation is seen. Electronically Signed   By: Elmer Picker M.D.   On: 07/03/2022 13:26    Labs:  Recent Labs    07/27/22 0409  WBC 7.0  HGB 4.7*  HCT 15.8*  PLT 319   Recent Labs    07/27/22 0409  NA 144  K 4.1  CL 97*  CO2 29  GLUCOSE 93  BUN 70*  CREATININE 8.28*  CALCIUM 7.8*   Recent Labs    07/27/22 0409  PROT 6.9  ALBUMIN 2.8*  AST 25  ALT 17  ALKPHOS 104  BILITOT 0.6  BILIDIR 0.2  IBILI 0.4    Vitamin B12 1,055 in February 2023  No results for input(s): "HEPBSAG", "HCVAB", "HEPAIGM", "HEPBIGM" in the last 72 hours. No results for input(s): "LABPROT", "INR" in the last 72 hours.  Past Medical History:  Diagnosis Date   Acute metabolic  encephalopathy 04/10/9484   Anemia of chronic kidney failure    BPH (benign prostatic hyperplasia)    Colon cancer [facility]) 2014   spouse states he had surgury for colon CA   End stage renal disease on dialysis [facility]) 2017   TTHSat   Hepatitis C    treated   Hypertension    Hypertensive crisis 04/07/2022   Hypertensive heart disease with chronic diastolic congestive heart failure ([place]) 07/17/2016   Polysubstance abuse ([place])    History of heroin and marijuana use   Thoracic aortic aneurysm ([place])    followed by Dr. Ellyn Hack    Past Surgical History:  Procedure Laterality Date   A/V FISTULAGRAM Left 12/02/2021   Procedure: A/V Fistulagram;  Surgeon: Cherre Robins, MD;  Location: [place] CV LAB;  Service: Cardiovascular;  Laterality: Left;   A/V FISTULAGRAM Left 04/24/2022   Procedure: A/V Fistulagram;  Surgeon: Waynetta Sandy, MD;  Location: [place] [facility];  Service: Cardiovascular;  Laterality: Left;   ABDOMINAL SURGERY     AV FISTULA PLACEMENT Left 09/19/2016   Procedure: Left arm Radiocephalic ARTERIOVENOUS (AV) FISTULA CREATION;  Surgeon: Conrad [place], MD;  Location: [place];  Service: Vascular;  Laterality: Left;   BASCILIC VEIN TRANSPOSITION Left 07/09/2017   Procedure: BRACHIOCEPHALIC FISTULA CREATION;  Surgeon: Conrad [place], MD;  Location: [place];  Service: Vascular;  Laterality: Left;   BIOPSY  07/05/2022   Procedure: BIOPSY;  Surgeon: Thornton Park, MD;  Location: [place];  Service: Gastroenterology;;   COLON SURGERY  2014   COLONOSCOPY N/A 07/12/2022   Procedure: COLONOSCOPY;  Surgeon: Doran Stabler, MD;  Location: [place];  Service: Gastroenterology;  Laterality: N/A;   COLONOSCOPY WITH PROPOFOL N/A 07/05/2022   Procedure: COLONOSCOPY WITH PROPOFOL;  Surgeon: Thornton Park, MD;  Location: [place];  Service: Gastroenterology;  Laterality: N/A;   ESOPHAGOGASTRODUODENOSCOPY (EGD) WITH PROPOFOL N/A 11/05/2021   Procedure:  ESOPHAGOGASTRODUODENOSCOPY (EGD) WITH PROPOFOL;  Surgeon: Carol Ada, MD;  Location: [place];  Service: Endoscopy;  Laterality: N/A;   ESOPHAGOGASTRODUODENOSCOPY (EGD) WITH PROPOFOL N/A 11/14/2021   Procedure: ESOPHAGOGASTRODUODENOSCOPY (EGD) WITH PROPOFOL;  Surgeon: [name], MD;  Location: [place];  Service: Gastroenterology;  Laterality: N/A;   ESOPHAGOGASTRODUODENOSCOPY (EGD) WITH PROPOFOL N/A 11/16/2021   Procedure: ESOPHAGOGASTRODUODENOSCOPY (EGD) WITH PROPOFOL;  Surgeon: [name], MD;  Location: [place];  Service: Gastroenterology;  Laterality: N/A;   ESOPHAGOGASTRODUODENOSCOPY (EGD) WITH PROPOFOL N/A 07/05/2022   Procedure: ESOPHAGOGASTRODUODENOSCOPY (EGD) WITH PROPOFOL;  Surgeon: Thornton Park, MD;  Location: [place];  Service: Gastroenterology;  Laterality: N/A;   ESOPHAGOGASTRODUODENOSCOPY (EGD) WITH PROPOFOL N/A 07/12/2022   Procedure: ESOPHAGOGASTRODUODENOSCOPY (EGD) WITH PROPOFOL;  Surgeon: Doran Stabler, MD;  Location: [place];  Service: Gastroenterology;  Laterality: N/A;   HEMOSTASIS CLIP PLACEMENT  11/16/2021   Procedure: HEMOSTASIS CLIP PLACEMENT;  Surgeon: [name], MD;  Location[facility] ENDOSCOPY;  Service: Gastroenterology;;   HEMOSTASIS CLIP PLACEMENT  07/05/2022   Procedure: HEMOSTASIS CLIP PLACEMENT;  Surgeon: Thornton Park, MD;  Location: [facility] ENDOSCOPY;  Service: Gastroenterology;;   HEMOSTASIS CONTROL  11/05/2021   Procedure: HEMOSTASIS CONTROL;  Surgeon: Carol Ada, MD;  Location: [place];  Service: Endoscopy;;   HEMOSTASIS  CONTROL  11/16/2021   Procedure: HEMOSTASIS CONTROL;  Surgeon: [name], MD;  Location: [facility] ENDOSCOPY;  Service: Gastroenterology;;   HEMOSTASIS CONTROL  07/05/2022   Procedure: HEMOSTASIS CONTROL;  Surgeon: Thornton Park, MD;  Location: [place];  Service: Gastroenterology;;   HOT HEMOSTASIS N/A 11/16/2021   Procedure: HOT HEMOSTASIS (ARGON PLASMA COAGULATION/BICAP);  Surgeon: [name], MD;  Location: [place];  Service: Gastroenterology;  Laterality: N/A;   INSERTION OF DIALYSIS CATHETER N/A 09/19/2016   Procedure: INSERTION OF TUNNELED DIALYSIS CATHETER;  Surgeon: Conrad [place], MD;  Location: [place];  Service: Vascular;  Laterality: N/A;   IR ANGIOGRAM SELECTIVE EACH ADDITIONAL VESSEL  11/16/2021   IR ANGIOGRAM VISCERAL SELECTIVE  11/05/2021   IR ANGIOGRAM VISCERAL SELECTIVE  11/16/2021   IR ANGIOGRAM VISCERAL SELECTIVE  11/16/2021   IR EMBO ART  VEN HEMORR LYMPH EXTRAV  INC GUIDE ROADMAPPING  11/05/2021   IR EMBO ART  VEN HEMORR LYMPH EXTRAV  INC GUIDE ROADMAPPING  11/16/2021   IR GENERIC HISTORICAL  09/14/2016   IR US GUIDE VASC ACCESS RIGHT 09/14/2016 [name], DO [facility]   IR GENERIC HISTORICAL  09/14/2016   IR FLUORO GUIDE CV LINE RIGHT 09/14/2016 [name], DO [facility]   IR PARACENTESIS  06/22/2021   IR US GUIDE [place] RIGHT  11/05/2021   IR US GUIDE [place] RIGHT  11/16/2021   LAPAROTOMY N/A 05/23/2021   Procedure: EXPLORATORY LAPAROTOMY LYSIS ADHESIONS;  Surgeon: [name], MD;  Location: [place];  Service: General;  Laterality: N/A;   LAPAROTOMY N/A 11/20/2021   Procedure: EXPLORATORY LAPAROTOMY, REPAIR OF BLEEDING DUODENAL ULCER;  Surgeon: Dwan Bolt, MD;  Location: [place];  Service: General;  Laterality: N/A;   ORIF TIBIA PLATEAU Left 01/15/2018   Procedure: OPEN REDUCTION INTERNAL FIXATION (ORIF) TIBIAL PLATEAU;  Surgeon: Altamese Silvis, MD;  Location: [place];  Service: Orthopedics;  Laterality: Left;   REVISON OF ARTERIOVENOUS FISTULA Left 05/26/2022   Procedure: REVISION OF LEFT ARM ARTERIOVENOUS FISTULA WITH PLICATION OF PSEUDOANEURYSM;  Surgeon: Waynetta Sandy, MD;  Location: [place];  Service: Vascular;  Laterality: Left;   SCLEROTHERAPY  11/05/2021   Procedure: Clide Deutscher;  Surgeon: Carol Ada, MD;  Location: [place];  Service: Endoscopy;;   SCLEROTHERAPY  11/16/2021   Procedure: Clide Deutscher;  Surgeon:  [name], MD;  Location: [facility] ENDOSCOPY;  Service: Gastroenterology;;   TRANSTHORACIC ECHOCARDIOGRAM  07/2016    EF 60-65%, No RWMA. Mod Concentric LVH - Gr 2 DD. Severe LA dilation. PAP ~35 mmHg (mild Pulm HTN)  --> no changes noted 1 month later    Family History  Problem Relation Age of Onset   Heart failure Mother        Died at age 36.   Heart attack Mother 28   Hypertension Mother    Diabetes Mellitus II Mother    Other Father        Unknown   Kidney failure Sister        (Oldest Sister)   Other Other        Multiple siblings have started her heart disease, he is not sure of the details.   CAD Nephew     Prior to Admission medications   Medication Sig Start Date End Date Taking? Authorizing Provider  albuterol (VENTOLIN HFA) 108 (90 Base) MCG/ACT inhaler Inhale 2 puffs into the lungs every 6 (six) hours as needed for wheezing or shortness of breath. 01/06/22  Yes [provider]  carvedilol (COREG) 3.125 MG tablet Take 3.125 mg by mouth 2 (two) times daily with a meal.   Yes [provider]  diltiazem (TIAZAC) 360 MG 24 hr capsule Take 360 mg by mouth in the morning.    [provider]  DULoxetine (CYMBALTA) 20 MG capsule Take 20 mg by mouth daily. Patient not taking: Reported on 07/03/2022 01/06/22   [provider]  fluticasone (FLONASE) 50 MCG/ACT nasal spray Place 2 sprays into both nostrils daily. 06/26/22   [provider]  gabapentin (NEURONTIN) 100 MG capsule Take 1 capsule (100 mg total) by mouth every Tuesday, Thursday, and Saturday at 6 PM. 07/13/22   Florencia Reasons, MD  hydrALAZINE (APRESOLINE) 25 MG tablet Take 1 tablet (25 mg total) by mouth every 6 (six) hours. 03/15/22   Edwin Dada, MD  LIDOCAINE-PRILOCAINE EX Apply 1 application  topically Every Tuesday,Thursday,and Saturday with dialysis.    [provider]  losartan (COZAAR) 25 MG tablet Take 25 mg by mouth daily. 06/26/22   [provider]   montelukast (SINGULAIR) 10 MG tablet Take 10 mg by mouth at bedtime. 06/26/22   [provider]  multivitamin (RENA-VIT) TABS tablet Take 1 tablet by mouth at bedtime. 03/15/22   Danford, Suann Larry, MD  oxyCODONE (OXY IR/ROXICODONE) 5 MG immediate release tablet Take 5 mg by mouth every 8 (eight) hours as needed for pain or severe pain. 12/09/21   [provider]  pantoprazole (PROTONIX) 40 MG tablet Take 1 tablet (40 mg total) by mouth 2 (two) times daily. 07/12/22   Florencia Reasons, MD  sevelamer carbonate (RENVELA) 800 MG tablet Take 1,600 mg by mouth 3 (three) times daily with meals. Patient not taking: Reported on 07/03/2022 11/14/21   [provider]    Current Facility-Administered Medications  Medication Dose Route Frequency Provider Last Rate Last Admin   acetaminophen (TYLENOL) tablet 650 mg  650 mg Oral Q6H PRN Mansy, Jan A, MD       Or   acetaminophen (TYLENOL) suppository 650 mg  650 mg Rectal Q6H PRN Mansy, Jan A, MD       albuterol (PROVENTIL) (2.5 MG/3ML) 0.083% nebulizer solution 2.5 mg  2.5 mg Inhalation Q6H PRN Mansy, Jan A, MD       carvedilol (COREG) tablet 3.125 mg  3.125 mg Oral BID WC Mansy, Jan A, MD   3.125 mg at 07/27/22 8115   diltiazem (CARDIZEM CD) 24 hr capsule 360 mg  360 mg Oral Daily Mansy, Jan A, MD       fluticasone (FLONASE) 50 MCG/ACT nasal spray 2 spray  2 spray Each Nare Daily Mansy, Jan A, MD       gabapentin (NEURONTIN) capsule 100 mg  100 mg Oral Q T,Th,Sat-1800 Mansy, Jan A, MD       hydrALAZINE (APRESOLINE) tablet 25 mg  25 mg Oral Q6H Mansy, Jan A, MD   25 mg at 07/27/22 0555   lidocaine-prilocaine (EMLA) cream   Topical Q T,Th,Sa-HD Mansy, Jan A, MD       losartan (COZAAR) tablet 25 mg  25 mg Oral Daily Mansy, Jan A, MD       magnesium hydroxide (MILK OF MAGNESIA) suspension 30 mL  30 mL Oral Daily PRN Mansy, Jan A, MD       montelukast (SINGULAIR) tablet 10 mg  10 mg Oral QHS Mansy, Jan A, MD       multivitamin (RENA-VIT)  tablet 1 tablet  1 tablet Oral  QHS Mansy, Jan A, MD       ondansetron [facility]) tablet 4 mg  4 mg Oral Q6H PRN Mansy, Jan A, MD       Or   ondansetron [facility]) injection 4 mg  4 mg Intravenous Q6H PRN Mansy, Jan A, MD       oxyCODONE (Oxy IR/ROXICODONE) immediate release tablet 5 mg  5 mg Oral Q8H PRN Mansy, Jan A, MD   5 mg at 07/27/22 0824   pantoprazole (PROTONIX) EC tablet 40 mg  40 mg Oral BID Mansy, Jan A, MD       [START ON 07/30/2022] pantoprazole (PROTONIX) injection 40 mg  40 mg Intravenous Q12H Mansy, Jan A, MD       pantoprozole (PROTONIX) 80 mg /NS 100 mL infusion  8 mg/hr Intravenous Continuous Mansy, Jan A, MD 10 mL/hr at 07/27/22 0856 8 mg/hr at 07/27/22 0856   sevelamer carbonate (RENVELA) tablet 1,600 mg  1,600 mg Oral TID WC Mansy, Jan A, MD   1,600 mg at 07/27/22 9449   traZODone (DESYREL) tablet 25 mg  25 mg Oral QHS PRN Mansy, Arvella Merles, MD       Current Outpatient Medications  Medication Sig Dispense Refill   albuterol (VENTOLIN HFA) 108 (90 Base) MCG/ACT inhaler Inhale 2 puffs into the lungs every 6 (six) hours as needed for wheezing or shortness of breath.     carvedilol (COREG) 3.125 MG tablet Take 3.125 mg by mouth 2 (two) times daily with a meal.     diltiazem (TIAZAC) 360 MG 24 hr capsule Take 360 mg by mouth in the morning.     DULoxetine (CYMBALTA) 20 MG capsule Take 20 mg by mouth daily. (Patient not taking: Reported on 07/03/2022)     fluticasone (FLONASE) 50 MCG/ACT nasal spray Place 2 sprays into both nostrils daily.     gabapentin (NEURONTIN) 100 MG capsule Take 1 capsule (100 mg total) by mouth every Tuesday, Thursday, and Saturday at 6 PM. 30 capsule 0   hydrALAZINE (APRESOLINE) 25 MG tablet Take 1 tablet (25 mg total) by mouth every 6 (six) hours. 90 tablet 3   LIDOCAINE-PRILOCAINE EX Apply 1 application  topically Every Tuesday,Thursday,and Saturday with dialysis.     losartan (COZAAR) 25 MG tablet Take 25 mg by mouth daily.     montelukast (SINGULAIR) 10 MG  tablet Take 10 mg by mouth at bedtime.     multivitamin (RENA-VIT) TABS tablet Take 1 tablet by mouth at bedtime. 30 tablet 3   oxyCODONE (OXY IR/ROXICODONE) 5 MG immediate release tablet Take 5 mg by mouth every 8 (eight) hours as needed for pain or severe pain.     pantoprazole (PROTONIX) 40 MG tablet Take 1 tablet (40 mg total) by mouth 2 (two) times daily. 60 tablet 0   sevelamer carbonate (RENVELA) 800 MG tablet Take 1,600 mg by mouth 3 (three) times daily with meals. (Patient not taking: Reported on 07/03/2022)      Allergies as of 07/26/2022   (No Known Allergies)    Social History   Socioeconomic History   Marital status: Married    Spouse name: Not on file   Number of children: Not on file   Years of education: Not on file   Highest education level: Not on file  Occupational History   Occupation: disabled  Tobacco Use   Smoking status: Former    Packs/day: 0.25    Types: Cigarettes    Quit date: 05/12/2022    Years  since quitting: 0.2    Passive exposure: Never   Smokeless tobacco: Never  Vaping Use   Vaping Use: Never used  Substance and Sexual Activity   Alcohol use: No    Alcohol/week: 7.0 standard drinks of alcohol    Types: 7 Cans of beer per week   Drug use: Yes    Frequency: 1.0 times per week    Types: Marijuana, Heroin    Comment: last time 05/25/22   Sexual activity: Yes    Comment: once a week  Other Topics Concern   Not on file  Social History Narrative   Not on file   Social Determinants of Health   Financial Resource Strain: Not on file  Food Insecurity: No Food Insecurity (07/03/2022)   Hunger Vital Sign    Worried About Running Out of Food in the Last Year: Never true    Ran Out of Food in the Last Year: Never true  Transportation Needs: No Transportation Needs (07/03/2022)   PRAPARE - Hydrologist (Medical): No    Lack of Transportation (Non-Medical): No  Physical Activity: Not on file  Stress: Not on file   Social Connections: Not on file  Intimate Partner Violence: Not At Risk (07/03/2022)   Humiliation, Afraid, Rape, and Kick questionnaire    Fear of Current or Ex-Partner: No    Emotionally Abused: No    Physically Abused: No    Sexually Abused: No    Review of Systems: Positive for burning sensation in both feet. All systems reviewed and negative except where noted in HPI.  Physical Exam: Vital signs in last 24 hours: Temp:  [97.3 F (36.3 C)-98.1 F (36.7 C)] 97.3 F (36.3 C) (10/12 0856) Pulse Rate:  [83-95] 91 (10/12 0856) Resp:  [15-24] 21 (10/12 0856) BP: (146-202)/(80-111) 167/105 (10/12 0856) SpO2:  [94 %-100 %] 96 % (10/12 0856) Weight:  [74 kg] 74 kg (10/11 1854)    General:  Alert male in NAD Psych:  cooperative. Normal mood and affect Eyes: Pupils equal, no icterus. Conjunctive pink Ears:  Normal auditory acuity Nose: No deformity, discharge or lesions Neck:  Supple, no masses felt Lungs:  Clear to auscultation.  Heart:  Regular rate, regular rhythm. No lower extremity edema Abdomen:  Soft, nondistended, nontender, active bowel sounds, no masses felt Rectal :  Deferred Msk: Symmetrical without gross deformities.  Neurologic:  Alert, oriented, grossly normal neurologically Skin:  Intact without significant lesions.    Intake/Output from previous day: No intake/output data recorded. Intake/Output this shift:  Total I/O In: 315 [Blood:315] Out: -     Principal Problem:   GI bleeding Active Problems:   Acute on chronic combined systolic and diastolic CHF (congestive heart failure) (HCC)   HTN (hypertension)   ESRD on HD TTS   Paroxysmal atrial fibrillation (HCC)    Tye Savoy, NP-C @  07/27/2022, 9:14 AM

## 2022-07-27 NOTE — H&P (Signed)
[place]   PATIENT NAME: Timothy [name]    MR#:  [id number]  DATE OF BIRTH:  [birth year]  DATE OF ADMISSION:  07/26/2022  PRIMARY CARE PHYSICIAN: [name], MD   Patient is coming from: Home  REQUESTING/REFERRING PHYSICIAN: Delora Fuel, MD  CHIEF COMPLAINT:   Chief Complaint  Patient presents with   low hgb    HISTORY OF PRESENT ILLNESS:  Timothy [name] is a 61 y.o. African-American male with medical history significant for GI bleeding and duodenal ulcer with IR embolization on 11/16/2021 for bleeding duodenal ulcer, end-stage renal disease on hemodialysis on Tuesday Thursday and Saturday, combined systolic and diastolic CHF, BPH, hepatitis C, hypertension, polysubstance abuse and colon cancer status post colectomy, who presented to the ER with acute onset of anemia with hemoglobin of 5.5 at his hemodialysis session.  He denied any nausea vomiting or heartburn.  No melena or bright red bleeding per rectum.  He denied any abdominal pain or diarrhea.  No other bleeding diathesis.  He has been feeling weak and fatigued.  No chest pain or palpitations.  No cough or wheezing or hemoptysis or dyspnea.  The patient was recently admitted here and discharged on 9/27.  He underwent 2 recent upper and lower GI endoscopies.  On 9/20 EGD and colonoscopy were performed and repeated on 9/27 and did not reveal any active bleeding.  He was cleared for discharge on PPI therapy twice daily with recommendation for repeat colonoscopy in 3 years.  ED Course: When the patient came to the ER, BP was 168/101 then 146/80 and later 202/111 with otherwise normal vital signs.  Labs revealed BUN of 78 creatinine of 8.28 up from 34/5.5 on 9/27 with anion gap of 18 and chloride of 97.  CBC showed hemoglobin of 4.7 and 15.8 compared to 7.4/23.5 on 9/27 with macrocytosis.  Blood group was B+ with negative antibody screen.  Stool Hemoccult was positive. EKG as reviewed by me : EKG showed normal sinus  rhythm with a rate of 93 with borderline repolarization abnormality, T wave inversion laterally and borderline prolonged QT interval with QTc of 484 MS. Imaging: Portable chest x-ray showed diffuse to exclude asymmetric right lung interstitial opacity or edema favoring artifact due to portable technique and otherwise stable atelectasis or scarring the right lower lung with mild cardiomegaly.  The patient was typed and crossmatched will be transfused 2 units of packed red blood cells.  Contact was made with Dr. Joelyn Oms for hemodialysis today.  ER physician will contact GI as well.  The patient will be admitted to a progressive unit bed for further evaluation and management PAST MEDICAL HISTORY:   Past Medical History:  Diagnosis Date   Acute metabolic encephalopathy 28/36/6294   Anemia of chronic kidney failure    BPH (benign prostatic hyperplasia)    Colon cancer ([place]) 2014   spouse states he had surgury for colon CA   End stage renal disease on dialysis ([place]) 2017   TTHSat   Hepatitis C    treated   Hypertension    Hypertensive crisis 04/07/2022   Hypertensive heart disease with chronic diastolic congestive heart failure ([place]) 07/17/2016   Polysubstance abuse ([place])    History of heroin and marijuana use   Thoracic aortic aneurysm ([place])    followed by Dr. Ellyn Hack    PAST SURGICAL HISTORY:   Past Surgical History:  Procedure Laterality Date   A/V FISTULAGRAM Left 12/02/2021   Procedure: A/V Fistulagram;  Surgeon: Jamelle Haring  N, MD;  Location: [place] [facility];  Service: Cardiovascular;  Laterality: Left;   A/V FISTULAGRAM Left 04/24/2022   Procedure: A/V Fistulagram;  Surgeon: Waynetta Sandy, MD;  Location: [place] [facility];  Service: Cardiovascular;  Laterality: Left;   ABDOMINAL SURGERY     AV FISTULA PLACEMENT Left 09/19/2016   Procedure: Left arm Radiocephalic ARTERIOVENOUS (AV) FISTULA CREATION;  Surgeon: Conrad Dranesville, MD;  Location: [place];  Service: Vascular;   Laterality: Left;   BASCILIC VEIN TRANSPOSITION Left 07/09/2017   Procedure: BRACHIOCEPHALIC FISTULA CREATION;  Surgeon: Conrad [place], MD;  Location: [place];  Service: Vascular;  Laterality: Left;   BIOPSY  07/05/2022   Procedure: BIOPSY;  Surgeon: Thornton Park, MD;  Location: [place];  Service: Gastroenterology;;   COLON SURGERY  2014   COLONOSCOPY N/A 07/12/2022   Procedure: COLONOSCOPY;  Surgeon: Doran Stabler, MD;  Location: [place];  Service: Gastroenterology;  Laterality: N/A;   COLONOSCOPY WITH PROPOFOL N/A 07/05/2022   Procedure: COLONOSCOPY WITH PROPOFOL;  Surgeon: Thornton Park, MD;  Location: [place];  Service: Gastroenterology;  Laterality: N/A;   ESOPHAGOGASTRODUODENOSCOPY (EGD) WITH PROPOFOL N/A 11/05/2021   Procedure: ESOPHAGOGASTRODUODENOSCOPY (EGD) WITH PROPOFOL;  Surgeon: Carol Ada, MD;  Location: [place];  Service: Endoscopy;  Laterality: N/A;   ESOPHAGOGASTRODUODENOSCOPY (EGD) WITH PROPOFOL N/A 11/14/2021   Procedure: ESOPHAGOGASTRODUODENOSCOPY (EGD) WITH PROPOFOL;  Surgeon: [name], MD;  Location: [place];  Service: Gastroenterology;  Laterality: N/A;   ESOPHAGOGASTRODUODENOSCOPY (EGD) WITH PROPOFOL N/A 11/16/2021   Procedure: ESOPHAGOGASTRODUODENOSCOPY (EGD) WITH PROPOFOL;  Surgeon: [name], MD;  Location: [place];  Service: Gastroenterology;  Laterality: N/A;   ESOPHAGOGASTRODUODENOSCOPY (EGD) WITH PROPOFOL N/A 07/05/2022   Procedure: ESOPHAGOGASTRODUODENOSCOPY (EGD) WITH PROPOFOL;  Surgeon: Thornton Park, MD;  Location: [place];  Service: Gastroenterology;  Laterality: N/A;   ESOPHAGOGASTRODUODENOSCOPY (EGD) WITH PROPOFOL N/A 07/12/2022   Procedure: ESOPHAGOGASTRODUODENOSCOPY (EGD) WITH PROPOFOL;  Surgeon: Doran Stabler, MD;  Location: [place];  Service: Gastroenterology;  Laterality: N/A;   HEMOSTASIS CLIP PLACEMENT  11/16/2021   Procedure: HEMOSTASIS CLIP PLACEMENT;  Surgeon: [name], MD;   [name];  Service: Gastroenterology;;   HEMOSTASIS CLIP PLACEMENT  07/05/2022   Procedure: HEMOSTASIS CLIP PLACEMENT;  Surgeon: Thornton Park, MD;  Location: [facility] ENDOSCOPY;  Service: Gastroenterology;;   HEMOSTASIS CONTROL  11/05/2021   Procedure: HEMOSTASIS CONTROL;  Surgeon: Carol Ada, MD;  Location: [place];  Service: Endoscopy;;   HEMOSTASIS CONTROL  11/16/2021   Procedure: HEMOSTASIS CONTROL;  Surgeon: [name], MD;  Location: [place];  Service: Gastroenterology;;   HEMOSTASIS CONTROL  07/05/2022   Procedure: HEMOSTASIS CONTROL;  Surgeon: Thornton Park, MD;  Location: [place];  Service: Gastroenterology;;   HOT HEMOSTASIS N/A 11/16/2021   Procedure: HOT HEMOSTASIS (ARGON PLASMA COAGULATION/BICAP);  Surgeon: [name], MD;  Location: [place];  Service: Gastroenterology;  Laterality: N/A;   INSERTION OF DIALYSIS CATHETER N/A 09/19/2016   Procedure: INSERTION OF TUNNELED DIALYSIS CATHETER;  Surgeon: Conrad Hilltop, MD;  Location: [place];  Service: Vascular;  Laterality: N/A;   IR ANGIOGRAM SELECTIVE EACH ADDITIONAL VESSEL  11/16/2021   IR ANGIOGRAM VISCERAL SELECTIVE  11/05/2021   IR ANGIOGRAM VISCERAL SELECTIVE  11/16/2021   IR ANGIOGRAM VISCERAL SELECTIVE  11/16/2021   IR EMBO ART  VEN HEMORR LYMPH EXTRAV  INC GUIDE ROADMAPPING  11/05/2021   IR EMBO ART  VEN HEMORR LYMPH EXTRAV  INC GUIDE ROADMAPPING  11/16/2021   IR  GENERIC HISTORICAL  09/14/2016   IR US GUIDE VASC ACCESS RIGHT 09/14/2016 [name], DO [facility]   IR GENERIC HISTORICAL  09/14/2016   IR FLUORO GUIDE CV LINE RIGHT 09/14/2016 [name], DO [facility]   IR PARACENTESIS  06/22/2021   IR US GUIDE VASC ACCESS RIGHT  11/05/2021   IR US GUIDE [place] RIGHT  11/16/2021   LAPAROTOMY N/A 05/23/2021   Procedure: EXPLORATORY LAPAROTOMY LYSIS ADHESIONS;  Surgeon: [name], MD;  Location: [place];  Service: General;  Laterality: N/A;   LAPAROTOMY N/A 11/20/2021   Procedure:  EXPLORATORY LAPAROTOMY, REPAIR OF BLEEDING DUODENAL ULCER;  Surgeon: Dwan Bolt, MD;  Location: [place];  Service: General;  Laterality: N/A;   ORIF TIBIA PLATEAU Left 01/15/2018   Procedure: OPEN REDUCTION INTERNAL FIXATION (ORIF) TIBIAL PLATEAU;  Surgeon: Altamese Yukon, MD;  Location: [place];  Service: Orthopedics;  Laterality: Left;   REVISON OF ARTERIOVENOUS FISTULA Left 05/26/2022   Procedure: REVISION OF LEFT ARM ARTERIOVENOUS FISTULA WITH PLICATION OF PSEUDOANEURYSM;  Surgeon: Waynetta Sandy, MD;  Location: [place];  Service: Vascular;  Laterality: Left;   SCLEROTHERAPY  11/05/2021   Procedure: Clide Deutscher;  Surgeon: Carol Ada, MD;  Location: [place];  Service: Endoscopy;;   SCLEROTHERAPY  11/16/2021   Procedure: Clide Deutscher;  Surgeon: [name], MD;  [facility];  Service: Gastroenterology;;   TRANSTHORACIC ECHOCARDIOGRAM  07/2016    EF 60-65%, No RWMA. Mod Concentric LVH - Gr 2 DD. Severe LA dilation. PAP ~35 mmHg (mild Pulm HTN)  --> no changes noted 1 month later    SOCIAL HISTORY:   Social History   Tobacco Use   Smoking status: Former    Packs/day: 0.25    Types: Cigarettes    Quit date: 05/12/2022    Years since quitting: 0.2    Passive exposure: Never   Smokeless tobacco: Never  Substance Use Topics   Alcohol use: No    Alcohol/week: 7.0 standard drinks of alcohol    Types: 7 Cans of beer per week    FAMILY HISTORY:   Family History  Problem Relation Age of Onset   Heart failure Mother        Died at age 99.   Heart attack Mother 39   Hypertension Mother    Diabetes Mellitus II Mother    Other Father        Unknown   Kidney failure Sister        (Oldest Sister)   Other Other        Multiple siblings have started her heart disease, he is not sure of the details.   CAD Nephew     DRUG ALLERGIES:  No Known Allergies  REVIEW OF SYSTEMS:   ROS As per history of present illness. All pertinent systems were reviewed  above. Constitutional, HEENT, cardiovascular, respiratory, GI, GU, musculoskeletal, neuro, psychiatric, endocrine, integumentary and hematologic systems were reviewed and are otherwise negative/unremarkable except for positive findings mentioned above in the HPI.   MEDICATIONS AT HOME:   Prior to Admission medications   Medication Sig Start Date End Date Taking? Authorizing Provider  albuterol (VENTOLIN HFA) 108 (90 Base) MCG/ACT inhaler Inhale 2 puffs into the lungs every 6 (six) hours as needed for wheezing or shortness of breath. 01/06/22  Yes [provider]  carvedilol (COREG) 3.125 MG tablet Take 3.125 mg by mouth 2 (two) times daily with a meal.   Yes [provider]  diltiazem (TIAZAC) 360 MG  24 hr capsule Take 360 mg by mouth in the morning.    [provider]  DULoxetine (CYMBALTA) 20 MG capsule Take 20 mg by mouth daily. Patient not taking: Reported on 07/03/2022 01/06/22   [provider]  fluticasone (FLONASE) 50 MCG/ACT nasal spray Place 2 sprays into both nostrils daily. 06/26/22   [provider]  gabapentin (NEURONTIN) 100 MG capsule Take 1 capsule (100 mg total) by mouth every Tuesday, Thursday, and Saturday at 6 PM. 07/13/22   Florencia Reasons, MD  hydrALAZINE (APRESOLINE) 25 MG tablet Take 1 tablet (25 mg total) by mouth every 6 (six) hours. 03/15/22   Edwin Dada, MD  LIDOCAINE-PRILOCAINE EX Apply 1 application  topically Every Tuesday,Thursday,and Saturday with dialysis.    [provider]  losartan (COZAAR) 25 MG tablet Take 25 mg by mouth daily. 06/26/22   [provider]  montelukast (SINGULAIR) 10 MG tablet Take 10 mg by mouth at bedtime. 06/26/22   [provider]  multivitamin (RENA-VIT) TABS tablet Take 1 tablet by mouth at bedtime. 03/15/22   Danford, Suann Larry, MD  oxyCODONE (OXY IR/ROXICODONE) 5 MG immediate release tablet Take 5 mg by mouth every 8 (eight) hours as needed for pain or severe pain.  12/09/21   [provider]  pantoprazole (PROTONIX) 40 MG tablet Take 1 tablet (40 mg total) by mouth 2 (two) times daily. 07/12/22   Florencia Reasons, MD  sevelamer carbonate (RENVELA) 800 MG tablet Take 1,600 mg by mouth 3 (three) times daily with meals. Patient not taking: Reported on 07/03/2022 11/14/21   [provider]      VITAL SIGNS:  Blood pressure (!) 162/103, pulse 83, temperature 98 F (36.7 C), temperature source Oral, resp. rate 20, height '6\' 1"'$  (1.854 m), weight 74 kg, SpO2 100 %.  PHYSICAL EXAMINATION:  Physical Exam  GENERAL:  61 y.o.-year-old African-American male patient lying in the bed with no acute distress.  EYES: Pupils equal, round, reactive to light and accommodation. No scleral icterus.  Positive pallor.  Extraocular muscles intact.  HEENT: Head atraumatic, normocephalic. Oropharynx and nasopharynx clear.  NECK:  Supple, no jugular venous distention. No thyroid enlargement, no tenderness.  LUNGS: Slightly diminished bibasilar breath sounds with mild bibasilar rales.  No use of accessory muscles of respiration.  CARDIOVASCULAR: Regular rate and rhythm, S1, S2 normal. No murmurs, rubs, or gallops.  ABDOMEN: Soft, nondistended, nontender. Bowel sounds present. No organomegaly or mass.  EXTREMITIES: No pedal edema, cyanosis, or clubbing.  NEUROLOGIC: Cranial nerves II through XII are intact. Muscle strength 5/5 in all extremities. Sensation intact. Gait not checked.  PSYCHIATRIC: The patient is alert and oriented x 3.  Normal affect and good eye contact. SKIN: No obvious rash, lesion, or ulcer.   LABORATORY PANEL:   CBC Recent Labs  Lab 07/27/22 0409  WBC 7.0  HGB 4.7*  HCT 15.8*  PLT 319   ------------------------------------------------------------------------------------------------------------------  Chemistries  Recent Labs  Lab 07/27/22 0409  NA 144  K 4.1  CL 97*  CO2 29  GLUCOSE 93  BUN 70*  CREATININE 8.28*  CALCIUM 7.8*    ------------------------------------------------------------------------------------------------------------------  Cardiac Enzymes No results for input(s): "TROPONINI" in the last 168 hours. ------------------------------------------------------------------------------------------------------------------  RADIOLOGY:  DG Chest Port 1 View  Result Date: 07/27/2022 CLINICAL DATA:  61 year old male with weakness. EXAM: PORTABLE CHEST 1 VIEW COMPARISON:  CT Abdomen and Pelvis 07/05/2022 and earlier. FINDINGS: Portable AP supine view at 0352 hours. Streaky, linear atelectasis or scarring at the right  lower lung does not appear significantly changed from last month. Borderline to mild cardiomegaly. Calcified aortic atherosclerosis. Stable cardiac size and mediastinal contours. Visualized tracheal air column is within normal limits. Difficult to exclude asymmetric increased background interstitial opacity in the right lung, but favor artifact instead (the right chest soft tissues also appear denser). No pneumothorax, pleural effusion or consolidation. IMPRESSION: 1. Difficult to exclude asymmetric right lung interstitial opacity or edema, but favor artifact due to portable technique instead. 2. Otherwise stable atelectasis or scarring in the right lower lung and mild cardiomegaly. Electronically Signed   By: Genevie Ann M.D.   On: 07/27/2022 04:25      IMPRESSION AND PLAN:  Assessment and Plan: * GI bleeding - The patient has subsequent acute blood loss symptomatic anemia. - He will be admitted to a progressive unit bed. - He was typed and crossmatched will be transfused 2 units of packed red blood cells. - We will follow posttransfusion H&H. - We will follow serial H&H. - GI consult will be obtained. - [facility] will be called to be aware about the patient.  The patient was seen before by Dr. Loletha Carrow and Dr. Tarri Glenn. - We will place him for now on IV Protonix with bolus and infusion.  Acute on  chronic combined systolic and diastolic CHF (congestive heart failure) ([place]) - This is mild and represents fluid overload with ESRD as well as worsening from symptomatic anemia. - We will have hemodialysis today as mentioned above.  ESRD on HD TTS -Nephrology consult will be obtained. - Dr. Joelyn Oms was notified and is aware about the patient.  Paroxysmal atrial fibrillation (HCC) - We will continue his Coreg. - He is obviously not a candidate for anticoagulation due to recurrent GI bleeding.  HTN (hypertension) - We will continue his antihypertensives.       DVT prophylaxis: SCDs. Advanced Care Planning:  Code Status: full code. Family Communication:  The plan of care was discussed in details with the patient (and family). I answered all questions. The patient agreed to proceed with the above mentioned plan. Further management will depend upon hospital course. Disposition Plan: Back to previous home environment Consults called: GI  and nephrology All the records are reviewed and case discussed with ED provider.  Status is: Inpatient   At the time of the admission, it appears that the appropriate admission status for this patient is inpatient.  This is judged to be reasonable and necessary in order to provide the required intensity of service to ensure the patient's safety given the presenting symptoms, physical exam findings and initial radiographic and laboratory data in the context of comorbid conditions.  The patient requires inpatient status due to high intensity of service, high risk of further deterioration and high frequency of surveillance required.  I certify that at the time of admission, it is my clinical judgment that the patient will require inpatient hospital care extending more than 2 midnights.                            Dispo: The patient is from: Home              Anticipated d/c is to: Home              Patient currently is not medically stable to d/c.               Difficult to place patient: No  Masami Plata A Gerardine Peltz  M.D on 07/27/2022 at 6:28 AM  [facility]   From 7 PM-7 AM, contact night-coverage [email or web address]  CC: Primary care physician; [name], MD

## 2022-07-27 NOTE — ED Provider Notes (Signed)
[facility] EMERGENCY DEPARTMENT Provider Note   CSN: [id number] Arrival date & time: 07/26/22  1751     History  Chief Complaint  Patient presents with   low hgb    Timothy [name] is a 61 y.o. male.  The history is provided by the patient.  He has a history of hypertension, end-stage renal disease on hemodialysis, combined systolic and diastolic heart failure, anemia and was told to come in because hemoglobin checked at last dialysis session was 5.5.  He denies abdominal pain, nausea, vomiting, dark stools or blood in his stools.  He has been feeling generally weak.  He denies chest pain or dyspnea.   Home Medications Prior to Admission medications   Medication Sig Start Date End Date Taking? Authorizing Provider  albuterol (VENTOLIN HFA) 108 (90 Base) MCG/ACT inhaler Inhale 2 puffs into the lungs every 6 (six) hours as needed for wheezing or shortness of breath. 01/06/22   [provider]  carvedilol (COREG) 3.125 MG tablet Take 3.125 mg by mouth 2 (two) times daily with a meal.    [provider]  diltiazem (TIAZAC) 360 MG 24 hr capsule Take 360 mg by mouth in the morning.    [provider]  DULoxetine (CYMBALTA) 20 MG capsule Take 20 mg by mouth daily. Patient not taking: Reported on 07/03/2022 01/06/22   [provider]  fluticasone (FLONASE) 50 MCG/ACT nasal spray Place 2 sprays into both nostrils daily. 06/26/22   [provider]  gabapentin (NEURONTIN) 100 MG capsule Take 1 capsule (100 mg total) by mouth every Tuesday, Thursday, and Saturday at 6 PM. 07/13/22   Florencia Reasons, MD  hydrALAZINE (APRESOLINE) 25 MG tablet Take 1 tablet (25 mg total) by mouth every 6 (six) hours. 03/15/22   Edwin Dada, MD  LIDOCAINE-PRILOCAINE EX Apply 1 application  topically Every Tuesday,Thursday,and Saturday with dialysis.    [provider]  losartan (COZAAR) 25 MG tablet Take 25 mg by mouth daily. 06/26/22   [provider]  montelukast (SINGULAIR) 10 MG tablet Take 10 mg by mouth at bedtime. 06/26/22   [provider]  multivitamin (RENA-VIT) TABS tablet Take 1 tablet by mouth at bedtime. 03/15/22   Danford, Suann Larry, MD  oxyCODONE (OXY IR/ROXICODONE) 5 MG immediate release tablet Take 5 mg by mouth every 8 (eight) hours as needed for pain or severe pain. 12/09/21   [provider]  pantoprazole (PROTONIX) 40 MG tablet Take 1 tablet (40 mg total) by mouth 2 (two) times daily. 07/12/22   Florencia Reasons, MD  sevelamer carbonate (RENVELA) 800 MG tablet Take 1,600 mg by mouth 3 (three) times daily with meals. Patient not taking: Reported on 07/03/2022 11/14/21   [provider]      Allergies    Patient has no known allergies.    Review of Systems   Review of Systems  All other systems reviewed and are negative.   Physical Exam Updated Vital Signs BP (!) 202/111 (BP Location: Right Arm)   Pulse 95   Temp 98 F (36.7 C) (Oral)   Resp 15   Ht '6\' 1"'$  (1.854 m)   Wt 74 kg   SpO2 98%   BMI 21.52 kg/m  Physical Exam Vitals and nursing note reviewed.   61 year old male, resting comfortably and in no acute distress. Vital signs are significant for elevated blood pressure. Oxygen saturation is 98%, which is normal. Head is normocephalic and atraumatic. PERRLA, EOMI. Oropharynx is  clear. Neck is nontender and supple without adenopathy.  JVD is present. Back is nontender and there is no CVA tenderness. Lungs have bibasilar rales which are more prominent on the right, diffuse coarse expiratory wheezes.  There are no rhonchi. Chest is nontender. Heart has regular rate and rhythm without murmur. Abdomen is soft, flat, nontender. Rectal: Decreased sphincter tone, small amount of brown stool present. Extremities have no cyanosis or edema, full range of motion is present.  AV fistula present on the left arm with thrill present. Skin is warm and dry without rash. Neurologic:  Mental status is normal, cranial nerves are intact, moves all extremities equally.  ED Results / Procedures / Treatments   Labs (all labs ordered are listed, but only abnormal results are displayed) Labs Reviewed  CBC WITH DIFFERENTIAL/PLATELET - Abnormal; Notable for the following components:      Result Value   RBC 1.56 (*)    Hemoglobin 4.7 (*)    HCT 15.8 (*)    MCV 101.3 (*)    MCHC 29.7 (*)    RDW 22.1 (*)    Lymphs Abs 0.5 (*)    All other components within normal limits  BASIC METABOLIC PANEL - Abnormal; Notable for the following components:   Chloride 97 (*)    BUN 70 (*)    Creatinine, Ser 8.28 (*)    Calcium 7.8 (*)    GFR, Estimated 7 (*)    Anion gap 18 (*)    All other components within normal limits  POC OCCULT BLOOD, ED - Abnormal; Notable for the following components:   Fecal Occult Bld POSITIVE (*)    All other components within normal limits  TYPE AND SCREEN  PREPARE RBC (CROSSMATCH)    EKG EKG Interpretation  Date/Time:  Thursday July 27 2022 03:28:00 EDT Ventricular Rate:  93 PR Interval:  177 QRS Duration: 95 QT Interval:  389 QTC Calculation: 484 R Axis:   13 Text Interpretation: Sinus rhythm Borderline repolarization abnormality Borderline prolonged QT interval When compared with ECG of 07/04/2022, No significant change was found Confirmed by Delora Fuel ([id number]) on 07/27/2022 3:31:29 AM  Radiology DG Chest Port 1 View  Result Date: 07/27/2022 CLINICAL DATA:  61 year old male with weakness. EXAM: PORTABLE CHEST 1 VIEW COMPARISON:  CT Abdomen and Pelvis 07/05/2022 and earlier. FINDINGS: Portable AP supine view at 0352 hours. Streaky, linear atelectasis or scarring at the right lower lung does not appear significantly changed from last month. Borderline to mild cardiomegaly. Calcified aortic atherosclerosis. Stable cardiac size and mediastinal contours. Visualized tracheal air column is within normal limits. Difficult to exclude asymmetric  increased background interstitial opacity in the right lung, but favor artifact instead (the right chest soft tissues also appear denser). No pneumothorax, pleural effusion or consolidation. IMPRESSION: 1. Difficult to exclude asymmetric right lung interstitial opacity or edema, but favor artifact due to portable technique instead. 2. Otherwise stable atelectasis or scarring in the right lower lung and mild cardiomegaly. Electronically Signed   By: Genevie Ann M.D.   On: 07/27/2022 04:25    Procedures Procedures  Cardiac monitor shows normal sinus rhythm, per my interpretation.  Medications Ordered in ED Medications - No data to display  ED Course/ Medical Decision Making/ A&P                           Medical Decision Making Amount and/or Complexity of Data Reviewed Labs: ordered. Radiology: ordered.  Risk Prescription drug  management. Decision regarding hospitalization.   Recurrent anemia in patient with end-stage renal disease.  Old records are reviewed, and he had been admitted from 07/03/2022 through 07/12/2022 because of an upper GI bleed.  Hemoglobin at time of discharge was 7.4.  He had colonoscopy and upper endoscopy both on 07/05/2022 and 07/12/2022.  On 07/05/2022 there was a spot which was oozing blood on the upper endoscopy which was clipped, no bleeding noted on second endoscopies.  Today, stool is Hemoccult positive.  He will need to be admitted for blood transfusion, will need dialysis today.  I have sent a secure chat to Dr. Joelyn Oms of nephrology service and received a reply that patient will be dialyzed today.  I have reviewed and interpreted his laboratory tests, and my interpretation is progressive anemia with hemoglobin now down to 4.7, elevated BUN and creatinine consistent with known end-stage renal disease.  I have ordered blood for transfusion.  I have discussed case with Dr. Sidney Ace of [facility], who agrees to admit the patient.  I have also sent a secure chat to Dr.  Fuller Plan of gastroenterology service for patient to be seen in consultation during the daytime.  CRITICAL CARE Performed by: Delora Fuel Total critical care time: 45 minutes Critical care time was exclusive of separately billable procedures and treating other patients. Critical care was necessary to treat or prevent imminent or life-threatening deterioration. Critical care was time spent personally by me on the following activities: development of treatment plan with patient and/or surrogate as well as nursing, discussions with consultants, evaluation of patient's response to treatment, examination of patient, obtaining history from patient or surrogate, ordering and performing treatments and interventions, ordering and review of laboratory studies, ordering and review of radiographic studies, pulse oximetry and re-evaluation of patient's condition.  Final Clinical Impression(s) / ED Diagnoses Final diagnoses:  Gastrointestinal hemorrhage, unspecified gastrointestinal hemorrhage type  End-stage renal disease on hemodialysis [facility])    Rx / DC Orders ED Discharge Orders     None         Delora Fuel, MD 64/40/34 [phone]

## 2022-07-28 ENCOUNTER — Inpatient Hospital Stay (HOSPITAL_COMMUNITY): Payer: Medicare Other | Admitting: Anesthesiology

## 2022-07-28 ENCOUNTER — Encounter (HOSPITAL_COMMUNITY)
Admission: EM | Disposition: A | Discharge status: Home / Self Care | Payer: Self-pay | Source: Ambulatory Visit | Attending: Internal Medicine

## 2022-07-28 ENCOUNTER — Encounter (HOSPITAL_COMMUNITY): Payer: Self-pay | Admitting: Family Medicine

## 2022-07-28 DIAGNOSIS — D631 Anemia in chronic kidney disease: Secondary | ICD-10-CM

## 2022-07-28 DIAGNOSIS — K2971 Gastritis, unspecified, with bleeding: Secondary | ICD-10-CM

## 2022-07-28 DIAGNOSIS — Z992 Dependence on renal dialysis: Secondary | ICD-10-CM

## 2022-07-28 DIAGNOSIS — I509 Heart failure, unspecified: Secondary | ICD-10-CM

## 2022-07-28 DIAGNOSIS — Z87891 Personal history of nicotine dependence: Secondary | ICD-10-CM

## 2022-07-28 DIAGNOSIS — T183XXA Foreign body in small intestine, initial encounter: Secondary | ICD-10-CM

## 2022-07-28 DIAGNOSIS — N186 End stage renal disease: Secondary | ICD-10-CM

## 2022-07-28 DIAGNOSIS — I132 Hypertensive heart and chronic kidney disease with heart failure and with stage 5 chronic kidney disease, or end stage renal disease: Secondary | ICD-10-CM

## 2022-07-28 HISTORY — PX: ENTEROSCOPY: SHX5533

## 2022-07-28 LAB — POCT I-STAT, CHEM 8
BUN: 75 mg/dL — ABNORMAL HIGH (ref 8–23)
Calcium, Ion: 0.84 mmol/L — CL (ref 1.15–1.40)
Chloride: 96 mmol/L — ABNORMAL LOW (ref 98–111)
Creatinine, Ser: 9.5 mg/dL — ABNORMAL HIGH (ref 0.61–1.24)
Glucose, Bld: 90 mg/dL (ref 70–99)
HCT: 26 % — ABNORMAL LOW (ref 39.0–52.0)
Hemoglobin: 8.8 g/dL — ABNORMAL LOW (ref 13.0–17.0)
Potassium: 4.6 mmol/L (ref 3.5–5.1)
Sodium: 135 mmol/L (ref 135–145)
TCO2: 26 mmol/L (ref 22–32)

## 2022-07-28 LAB — HEPATITIS B CORE ANTIBODY, TOTAL: Hep B Core Total Ab: REACTIVE — AB

## 2022-07-28 LAB — RENAL FUNCTION PANEL
Albumin: 2.9 g/dL — ABNORMAL LOW (ref 3.5–5.0)
Anion gap: 16 — ABNORMAL HIGH (ref 5–15)
BUN: 75 mg/dL — ABNORMAL HIGH (ref 8–23)
CO2: 24 mmol/L (ref 22–32)
Calcium: 7.4 mg/dL — ABNORMAL LOW (ref 8.9–10.3)
Chloride: 97 mmol/L — ABNORMAL LOW (ref 98–111)
Creatinine, Ser: 8.81 mg/dL — ABNORMAL HIGH (ref 0.61–1.24)
GFR, Estimated: 6 mL/min — ABNORMAL LOW (ref >60)
Glucose, Bld: 89 mg/dL (ref 70–99)
Phosphorus: 7.1 mg/dL — ABNORMAL HIGH (ref 2.5–4.6)
Potassium: 4.6 mmol/L (ref 3.5–5.1)
Sodium: 137 mmol/L (ref 135–145)

## 2022-07-28 LAB — CBC
HCT: 22.7 % — ABNORMAL LOW (ref 39.0–52.0)
Hemoglobin: 7.7 g/dL — ABNORMAL LOW (ref 13.0–17.0)
MCH: 30.2 pg (ref 26.0–34.0)
MCHC: 33.9 g/dL (ref 30.0–36.0)
MCV: 89 fL (ref 80.0–100.0)
Platelets: 295 10*3/uL (ref 150–400)
RBC: 2.55 MIL/uL — ABNORMAL LOW (ref 4.22–5.81)
RDW: 19.5 % — ABNORMAL HIGH (ref 11.5–15.5)
WBC: 6.6 10*3/uL (ref 4.0–10.5)
nRBC: 0 % (ref 0.0–0.2)

## 2022-07-28 LAB — HEPATITIS B SURFACE ANTIGEN: Hepatitis B Surface Ag: NONREACTIVE

## 2022-07-28 LAB — HEPATITIS B SURFACE ANTIBODY,QUALITATIVE: Hep B S Ab: REACTIVE — AB

## 2022-07-28 LAB — PREPARE RBC (CROSSMATCH)

## 2022-07-28 LAB — HEPATITIS C ANTIBODY: HCV Ab: REACTIVE — AB

## 2022-07-28 SURGERY — ENTEROSCOPY
Anesthesia: Monitor Anesthesia Care

## 2022-07-28 MED ORDER — SODIUM CHLORIDE 0.9% IV SOLUTION: Freq: Once | INTRAVENOUS | Status: DC

## 2022-07-28 MED ORDER — LIDOCAINE 2% (20 MG/ML) 5 ML SYRINGE
INTRAMUSCULAR | Status: DC | PRN
  Administered 2022-07-28: 100 mg via INTRAVENOUS

## 2022-07-28 MED ORDER — CHLORHEXIDINE GLUCONATE CLOTH 2 % EX PADS
6.0000 | MEDICATED_PAD | Freq: Every day | CUTANEOUS | Status: DC
  Administered 2022-07-31: 6 via TOPICAL

## 2022-07-28 MED ORDER — SODIUM CHLORIDE 0.9 % IV SOLN: INTRAVENOUS | Status: DC | PRN

## 2022-07-28 MED ORDER — SALINE SPRAY 0.65 % NA SOLN
1.0000 | NASAL | Status: DC | PRN
  Administered 2022-07-29: 1 via NASAL
  Filled 2022-07-28: qty 44

## 2022-07-28 MED ORDER — PROPOFOL 10 MG/ML IV BOLUS
INTRAVENOUS | Status: DC | PRN
  Administered 2022-07-28: 50 mg via INTRAVENOUS

## 2022-07-28 MED ORDER — PROPOFOL 500 MG/50ML IV EMUL
INTRAVENOUS | Status: DC | PRN
  Administered 2022-07-28: 125 ug/kg/min via INTRAVENOUS

## 2022-07-28 NOTE — Interval H&P Note (Signed)
History and Physical Interval Note:  07/28/2022 11:58 AM  Timothy [name]  has presented today for surgery, with the diagnosis of anemia, hemoccult positive stool.  The various methods of treatment have been discussed with the patient and family. After consideration of risks, benefits and other options for treatment, the patient has consented to  Procedure(s): ENTEROSCOPY (N/A) as a surgical intervention.  The patient's history has been reviewed, patient examined, no change in status, stable for surgery.  I have reviewed the patient's chart and labs.  Questions were answered to the patient's satisfaction.     [name]

## 2022-07-28 NOTE — Progress Notes (Signed)
Subjective: States tolerated dialysis yesterday with 3.4 L UF .states he is hungry.,  N.p.o. for GI study, denies shortness of breath or abdominal pain  Objective Vital signs in last 24 hours: Vitals:   07/27/22 2321 07/28/22 0430 07/28/22 0828 07/28/22 0844  BP: (!) 154/97 (!) 147/103 (!) 156/95 (!) 159/104  Pulse: 89 85 84 86  Resp: 17 16 (!) 22 20  Temp: 97.9 F (36.6 C) 98.2 F (36.8 C) 98.4 F (36.9 C) 98.4 F (36.9 C)  TempSrc: Oral Oral  Oral  SpO2: 96% 96% 93% 95%  Weight:      Height:       Weight change:   Physical Exam: General: Chronically ill adult male NAD Heart: RRR no MRG Lungs: Decreased breath sounds bases, nonlabored breathing Abdomen: NABS, soft NTND Extremities: No pedal edema  dialysis Access: Positive bruit LUA AVF   Home meds include - albuterol, coreg 3.125 bid, diltiazem 360 qd, gabapentin 100 tts, hydralazine 25 qid, losartan 25 qd, singulair, renavit, oxycodone prn, pantoprazole, sevelamer carb 2 ac tid, prns/ vits/ supps    OP HD: [facility]  3h 16mn  450/ 1.5  71kg  2/2 bath  P4  Hep none   LUA AVF - last HD 10/10, post wt 73.1kg - last Hb 5.0 on 10/10 - mircera 225 q2, last 10/5 - iron sucrose '50mg'$  q wk, last 10/5 - hectorol 2 ug IV tiw tts  Problem/Plan: Recurrent GI bleed= admit Hgb 4.7 status post 2 units transfusion PRBC Hgb 6.8=10/12, GI consulting, enteroscopy today, no heparin with HD ESRD -HD TTS schedule tolerated yesterday no drop in blood pressure 3.4 L UF Hypertension/volume = chest x-ray with edema yesterday next HD tomorrow attempt again 3 L UF, BP meds and UF for HTN control Anemia of ESRD/GI bleed-for ESA 10/19(give 200 Aranesp.)  Weekly Venofer on HD-Hgb trend as above-  Secondary hyperparathyroidism - C Ca in range, phosphorus 4.0, binders when taking meals A-fib paroxysmal-regular on my exam today , meds per admit, no  AC with GI bleed on diltiazem rate control  DErnest Haber PA-C [facility]  [phone]/13/2023,10:11 AM  LOS: 1 day   Labs: Basic Metabolic Panel: Recent Labs  Lab 07/27/22 0409  NA 144  K 4.1  CL 97*  CO2 29  GLUCOSE 93  BUN 70*  CREATININE 8.28*  CALCIUM 7.8*   Liver Function Tests: Recent Labs  Lab 07/27/22 0409  AST 25  ALT 17  ALKPHOS 104  BILITOT 0.6  PROT 6.9  ALBUMIN 2.8*   No results for input(s): "LIPASE", "AMYLASE" in the last 168 hours. No results for input(s): "AMMONIA" in the last 168 hours. CBC: Recent Labs  Lab 07/27/22 0409 07/27/22 2127  WBC 7.0  --   NEUTROABS 5.4  --   HGB 4.7* 6.8*  HCT 15.8* 20.7*  MCV 101.3*  --   PLT 319  --    Cardiac Enzymes: No results for input(s): "CKTOTAL", "CKMB", "CKMBINDEX", "TROPONINI" in the last 168 hours. CBG: No results for input(s): "GLUCAP" in the last 168 hours.  Studies/Results: DG Chest Port 1 View  Result Date: 07/27/2022 CLINICAL DATA:  61year old male with weakness. EXAM: PORTABLE CHEST 1 VIEW COMPARISON:  CT Abdomen and Pelvis 07/05/2022 and earlier. FINDINGS: Portable AP supine view at 0352 hours. Streaky, linear atelectasis or scarring at the right lower lung does not appear significantly changed from last month. Borderline to mild cardiomegaly. Calcified aortic atherosclerosis. Stable cardiac size and mediastinal contours. Visualized  tracheal air column is within normal limits. Difficult to exclude asymmetric increased background interstitial opacity in the right lung, but favor artifact instead (the right chest soft tissues also appear denser). No pneumothorax, pleural effusion or consolidation. IMPRESSION: 1. Difficult to exclude asymmetric right lung interstitial opacity or edema, but favor artifact due to portable technique instead. 2. Otherwise stable atelectasis or scarring in the right lower lung and mild cardiomegaly. Electronically Signed   By: Genevie Ann M.D.   On: 07/27/2022 04:25   Medications:  [START ON 08/01/2022] ferric gluconate (FERRLECIT) IVPB      pantoprazole 8 mg/hr (07/27/22 2323)    sodium chloride   Intravenous Once   carvedilol  3.125 mg Oral BID WC   Chlorhexidine Gluconate Cloth  6 each Topical Q0600   diltiazem  360 mg Oral Daily   doxercalciferol  2 mcg Intravenous Q T,Th,Sa-HD   fluticasone  2 spray Each Nare Daily   gabapentin  100 mg Oral Q T,Th,Sat-1800   hydrALAZINE  25 mg Oral Q6H   lidocaine-prilocaine   Topical Q T,Th,Sa-HD   losartan  25 mg Oral Daily   montelukast  10 mg Oral QHS   multivitamin  1 tablet Oral QHS   [START ON 07/30/2022] pantoprazole  40 mg Intravenous Q12H   sevelamer carbonate  1,600 mg Oral TID WC

## 2022-07-28 NOTE — Progress Notes (Signed)
PROGRESS NOTE        PATIENT DETAILS Name: Timothy [name] Age: 61 y.o. Sex: male Date of Birth: 09/26/1961 Admit Date: 07/26/2022 Admitting Physician Christel Mormon, MD [id number], Iona Beard, MD  Brief Summary: Patient is a 61 y.o.  male with history of ESRD on HD TTS, history of duodenal ulcer requiring IR embolization on 11/16/2021, recurrent GI bleeding requiring numerous hospitalizations and multiple endoscopic evaluations-just discharged from 9/27-referred to the ED from HD clinic due to severe anemia with a hemoglobin of 4.7.  Per patient-since his most recent discharge on 9/27-his stools have been black.  Significant events: 9/18-9/27>> hospitalization for presumed GI bleeding-CTA showed extravasation at splenic flexure, however EGD/colonoscopy were negative.  Required 3 units of PRBC. 10/11>> referred to the ED from HD clinic for hemoglobin of 4.7.  Brown stools per patient.  Significant studies: 10/12>> CXR: No obvious PNA.  Significant microbiology data: None  Procedures: None  Consults: GI Urology  Subjective: Denies any melanotic stool/hematochezia/hematemesis.  Upset that he is NPO.  Objective: Vitals: Blood pressure (!) 158/102, pulse 83, temperature 98.2 F (36.8 C), temperature source Oral, resp. rate 20, height '6\' 1"'$  (1.854 m), weight 74 kg, SpO2 96 %.   Exam: Gen Exam:Alert awake-not in any distress HEENT:atraumatic, normocephalic Chest: B/L clear to auscultation anteriorly CVS:S1S2 regular Abdomen:soft non tender, non distended Extremities:no edema Neurology: Non focal Skin: no rash   Pertinent Labs/Radiology:    Latest Ref Rng & Units 07/27/2022    9:27 PM 07/27/2022    4:09 AM 07/12/2022   12:29 AM  CBC  WBC 4.0 - 10.5 K/uL  7.0  5.5   Hemoglobin 13.0 - 17.0 g/dL 6.8  4.7  7.4   Hematocrit 39.0 - 52.0 % 20.7  15.8  23.5   Platelets 150 - 400 K/uL  319  260     Lab Results  Component Value Date   NA 144  07/27/2022   K 4.1 07/27/2022   CL 97 (L) 07/27/2022   CO2 29 07/27/2022      Assessment/Plan: Severe anemia Presumed recurrent GI bleeding with acute blood loss anemia Having brown stools-perhaps had transient GI bleed before hospitalization Has had extensive work-up with numerous endoscopic evaluations over the past several months GI planning small bowel enteroscopy today Hemoglobin 6.8-being transfused 1 additional unit of PRBC Continue PPI-await GI recommendations  ESRD on HD TTS Nephrology following  Anemia Multifactorial-from acute blood loss-superimposed on anemia of ESRD S/p 2 units of PRBC-getting third unit today. Fe/EPO defer to nephrology service  Chronic HFpEF Euvolemic Volume removal with HD.  PAF Maintaining sinus rhythm Continue Coreg/diltiazem Not a candidate for anticoagulation given recurrent GI bleeding  HTN BP stable with Coreg, diltiazem, losartan  History of thoracic aortic aneurysm Continue outpatient surveillance  History of colon cancer-s/p right hemi-colectomy 2014 Colonoscopy x2 this year negative for recurrence  History of HCV Treated-undetectable September 2022  BMI: Estimated body mass index is 21.52 kg/m as calculated from the following:   Height as of this encounter: '6\' 1"'$  (1.854 m).   Weight as of this encounter: 74 kg.   Code status:   Code Status: Full Code   DVT Prophylaxis: SCDs Start: 07/27/22 0524   Family Communication:  None at bedside   Disposition Plan: Status is: Inpatient Remains inpatient appropriate because: Recurrent GI bleeding with acute blood loss anemia-for enteroscopy later  today.   Planned Discharge Destination:Home   Diet: Diet Order             Diet NPO time specified Except for: Sips with Meds  Diet effective midnight                     Antimicrobial agents: Anti-infectives (From admission, onward)    None        MEDICATIONS: Scheduled Meds:  sodium chloride    Intravenous Once   carvedilol  3.125 mg Oral BID WC   Chlorhexidine Gluconate Cloth  6 each Topical Q0600   [START ON 07/29/2022] Chlorhexidine Gluconate Cloth  6 each Topical Q0600   diltiazem  360 mg Oral Daily   doxercalciferol  2 mcg Intravenous Q T,Th,Sa-HD   fluticasone  2 spray Each Nare Daily   gabapentin  100 mg Oral Q T,Th,Sat-1800   hydrALAZINE  25 mg Oral Q6H   lidocaine-prilocaine   Topical Q T,Th,Sa-HD   losartan  25 mg Oral Daily   montelukast  10 mg Oral QHS   multivitamin  1 tablet Oral QHS   [START ON 07/30/2022] pantoprazole  40 mg Intravenous Q12H   sevelamer carbonate  1,600 mg Oral TID WC   Continuous Infusions:  [START ON 08/01/2022] ferric gluconate (FERRLECIT) IVPB     pantoprazole 8 mg/hr (07/27/22 2323)   PRN Meds:.acetaminophen **OR** acetaminophen, albuterol, magnesium hydroxide, ondansetron **OR** ondansetron (ZOFRAN) IV, oxyCODONE, sodium chloride, traZODone   I have personally reviewed following labs and imaging studies  LABORATORY DATA: CBC: Recent Labs  Lab 07/27/22 0409 07/27/22 2127  WBC 7.0  --   NEUTROABS 5.4  --   HGB 4.7* 6.8*  HCT 15.8* 20.7*  MCV 101.3*  --   PLT 319  --      Basic Metabolic Panel: Recent Labs  Lab 07/27/22 0409  NA 144  K 4.1  CL 97*  CO2 29  GLUCOSE 93  BUN 70*  CREATININE 8.28*  CALCIUM 7.8*     GFR: Estimated Creatinine Clearance: 9.8 mL/min (A) (by C-G formula based on SCr of 8.28 mg/dL (H)).  Liver Function Tests: Recent Labs  Lab 07/27/22 0409  AST 25  ALT 17  ALKPHOS 104  BILITOT 0.6  PROT 6.9  ALBUMIN 2.8*    No results for input(s): "LIPASE", "AMYLASE" in the last 168 hours. No results for input(s): "AMMONIA" in the last 168 hours.  Coagulation Profile: No results for input(s): "INR", "PROTIME" in the last 168 hours.  Cardiac Enzymes: No results for input(s): "CKTOTAL", "CKMB", "CKMBINDEX", "TROPONINI" in the last 168 hours.  BNP (last 3 results) No results for  input(s): "PROBNP" in the last 8760 hours.  Lipid Profile: No results for input(s): "CHOL", "HDL", "LDLCALC", "TRIG", "CHOLHDL", "LDLDIRECT" in the last 72 hours.  Thyroid Function Tests: No results for input(s): "TSH", "T4TOTAL", "FREET4", "T3FREE", "THYROIDAB" in the last 72 hours.  Anemia Panel: No results for input(s): "VITAMINB12", "FOLATE", "FERRITIN", "TIBC", "IRON", "RETICCTPCT" in the last 72 hours.  Urine analysis:    Component Value Date/Time   COLORURINE YELLOW 05/19/2021 2015   APPEARANCEUR HAZY (A) 05/19/2021 2015   LABSPEC 1.012 05/19/2021 2015   PHURINE 9.0 (H) 05/19/2021 2015   GLUCOSEU NEGATIVE 05/19/2021 2015   HGBUR NEGATIVE 05/19/2021 2015   BILIRUBINUR NEGATIVE 05/19/2021 2015   KETONESUR NEGATIVE 05/19/2021 2015   PROTEINUR >=300 (A) 05/19/2021 2015   NITRITE NEGATIVE 05/19/2021 2015   LEUKOCYTESUR SMALL (A) 05/19/2021 2015    Sepsis Labs:  Lactic Acid, Venous    Component Value Date/Time   LATICACIDVEN 3.4 (Victoria Vera) 04/07/2022 0913    MICROBIOLOGY: Recent Results (from the past 240 hour(s))  MRSA Next Gen by PCR, Nasal     Status: None   Collection Time: 07/27/22  8:50 PM   Specimen: Nasal Mucosa; Nasal Swab  Result Value Ref Range Status   MRSA by PCR Next Gen NOT DETECTED NOT DETECTED Final    Comment: (NOTE) The GeneXpert MRSA Assay (FDA approved for NASAL specimens only), is one component of a comprehensive MRSA colonization surveillance program. It is not intended to diagnose MRSA infection nor to guide or monitor treatment for MRSA infections. Test performance is not FDA approved in patients less than 68 years old. Performed at [facility], [place] [address], [place], [place] [address]     RADIOLOGY STUDIES/RESULTS: DG Chest Port 1 View  Result Date: 07/27/2022 CLINICAL DATA:  61 year old male with weakness. EXAM: PORTABLE CHEST 1 VIEW COMPARISON:  CT Abdomen and Pelvis 07/05/2022 and earlier. FINDINGS: Portable AP supine view at  0352 hours. Streaky, linear atelectasis or scarring at the right lower lung does not appear significantly changed from last month. Borderline to mild cardiomegaly. Calcified aortic atherosclerosis. Stable cardiac size and mediastinal contours. Visualized tracheal air column is within normal limits. Difficult to exclude asymmetric increased background interstitial opacity in the right lung, but favor artifact instead (the right chest soft tissues also appear denser). No pneumothorax, pleural effusion or consolidation. IMPRESSION: 1. Difficult to exclude asymmetric right lung interstitial opacity or edema, but favor artifact due to portable technique instead. 2. Otherwise stable atelectasis or scarring in the right lower lung and mild cardiomegaly. Electronically Signed   By: Genevie Ann M.D.   On: 07/27/2022 04:25     LOS: 1 day   Oren Binet, MD  [facility]    To contact the attending provider between 7A-7P or the covering provider during after hours 7P-7A, please log into the web site [email or web address] and access using universal  password for that web site. If you do not have the password, please call the hospital operator.  07/28/2022, 11:23 AM

## 2022-07-28 NOTE — Anesthesia Postprocedure Evaluation (Signed)
Anesthesia Post Note  Patient: Timothy [name]  Procedure(s) Performed: ENTEROSCOPY     Patient location during evaluation: PACU Anesthesia Type: MAC Level of consciousness: awake and alert Pain management: pain level controlled Vital Signs Assessment: post-procedure vital signs reviewed and stable Respiratory status: spontaneous breathing, nonlabored ventilation and respiratory function stable Cardiovascular status: blood pressure returned to baseline and stable Postop Assessment: no apparent nausea or vomiting Anesthetic complications: no   No notable events documented.  Last Vitals:  Vitals:   07/28/22 1310 07/28/22 1315  BP: 135/84 139/85  [name]: 78 76  Resp: (!) 25 (!) 21  Temp:    SpO2: 94% 94%    Last Pain:  Vitals:   07/28/22 1315  TempSrc:   PainSc: 0-No pain                 Pervis Hocking

## 2022-07-28 NOTE — Op Note (Signed)
[facility] Patient Name: Timothy [name] Procedure Date : 07/28/2022 MRN: [id number] Attending MD: Georgian Co ,  Date of Birth: 1961/01/27 CSN: [id number] Age: 61 Admit Type: Inpatient Procedure:                Small bowel enteroscopy Indications:              Anemia Providers:                Adline Mango" Farris Has, RN,                            Cherylynn Ridges, Technician Referring MD:             Hospitalist team Medicines:                Monitored Anesthesia Care Complications:            No immediate complications. Estimated Blood Loss:     Estimated blood loss: none. Procedure:                Pre-Anesthesia Assessment:                           - Prior to the procedure, a History and Physical                            was performed, and patient medications and                            allergies were reviewed. The patient's tolerance of                            previous anesthesia was also reviewed. The risks                            and benefits of the procedure and the sedation                            options and risks were discussed with the patient.                            All questions were answered, and informed consent                            was obtained. Prior Anticoagulants: The patient has                            taken no previous anticoagulant or antiplatelet                            agents. ASA Grade Assessment: III - A patient with                            severe systemic disease. After reviewing the risks  and benefits, the patient was deemed in                            satisfactory condition to undergo the procedure.                           After obtaining informed consent, the endoscope was                            passed under direct vision. Throughout the                            procedure, the patient's blood pressure, pulse, and                             oxygen saturations were monitored continuously. The                            [id number]-[id number] ([id number] Olympus peds colonoscope was                            introduced through the mouth and advanced to the                            proximal jejunum. The small bowel enteroscopy was                            accomplished without difficulty. The patient                            tolerated the procedure well. Scope In: Scope Out: Findings:      The examined esophagus was normal.      Localized mild inflammation characterized by congestion (edema) and       erythema was found in the gastric antrum.      Coils from prior embolization were found in the duodenal bulb.      A foreign body (clip) was found in the second portion of the duodenum.      Clotted blood was found in the second portion of the duodenum near the       clip that was placed on the ampulla. This suggests that this area had       been recently bleeding. After the blood was cleared, there was no active       re-accumulation of blood.      There was no evidence of significant pathology in the proximal jejunum. Impression:               - Normal esophagus.                           - Gastritis.                           - Duodenal bulb with coils from prior embolization.                           -  Clips in 2nd portion of the duodenum                           - Blood in the second portion of the duodenum near                            previously placed clip, cleared without                            re-accumulation.                           - The examined portion of the jejunum was normal.                           - No specimens collected. Recommendation:           - Return patient to hospital ward for ongoing care.                           - Recommend MRI Abd/MRCP to rule out mass as a                            source of bleeding from ampulla.                           - Use a proton pump inhibitor PO BID for 12  weeks.                           - Previously H pylori serology was negative.                           - Avoid NSAIDs.                           - Check ferritin, iron/TIBC, folate, and EPO level                            to evaluate for sources of anemia.                           - The findings and recommendations were discussed                            with the patient. Procedure Code(s):        --- Professional ---                           [phone], Small intestinal endoscopy, enteroscopy                            beyond second portion of duodenum, not including                            ileum; diagnostic, including collection of  specimen(s) by brushing or washing, when performed                            (separate procedure) Diagnosis Code(s):        --- Professional ---                           K29.70, Gastritis, unspecified, without bleeding                           T18.3XXS, Foreign body in small intestine, sequela                           K92.2, Gastrointestinal hemorrhage, unspecified                           D64.9, Anemia, unspecified CPT copyright 2019 American Medical Association. All rights reserved. The codes documented in this report are preliminary and upon coder review may  be revised to meet current compliance requirements. Dr Georgian Co "Lyndee Leo" Lorenso Courier,  07/28/2022 12:57:49 PM Number of Addenda: 0

## 2022-07-28 NOTE — Progress Notes (Signed)
Mobility Specialist Progress Note:   07/28/22 1648  Mobility  Activity Refused mobility   Pt asleep and did not wake even after attempts at time of mobility session. RN aware. Will f/u as able.   Timothy [name] Mobility Specialist-Acute Rehab Secure Chat only

## 2022-07-28 NOTE — Transfer of Care (Signed)
Immediate Anesthesia Transfer of Care Note  Patient: Timothy [name]  Procedure(s) Performed: ENTEROSCOPY  Patient Location: PACU  Anesthesia Type:MAC  Level of Consciousness: awake and drowsy  Airway & Oxygen Therapy: Patient Spontanous Breathing and Patient connected to face mask oxygen  Post-op Assessment: Report given to RN and Post -op Vital signs reviewed and stable  Post vital signs: Reviewed and stable  Last Vitals:  Vitals Value Taken Time  BP 135/82 07/28/22 1250  Temp 36.3 C 07/28/22 1250  Pulse 74 07/28/22 1253  Resp 21 07/28/22 1253  SpO2 95 % 07/28/22 1253  Vitals shown include unvalidated device data.  Last Pain:  Vitals:   07/28/22 1250  TempSrc: Temporal  PainSc: 0-No pain         Complications: No notable events documented.

## 2022-07-28 NOTE — Anesthesia Preprocedure Evaluation (Addendum)
Anesthesia Evaluation  Patient identified by MRN, date of birth, ID band Patient awake    Reviewed: Allergy & Precautions, NPO status , Patient's Chart, lab work & pertinent test results, reviewed documented beta blocker date and time   Airway Mallampati: III  TM Distance: >3 FB Neck ROM: Full    Dental  (+) Poor Dentition, Dental Advisory Given   Pulmonary asthma , Patient abstained from smoking., former smoker,  Quit smoking 04/2022   Pulmonary exam normal breath sounds clear to auscultation       Cardiovascular hypertension, Pt. on medications and Pt. on home beta blockers pulmonary hypertension (mod pHTN on echo 02/2022)+CHF (LVEF 35-32%, grade 2 diastolic dysfunction) and + DOE  Normal cardiovascular exam+ Valvular Problems/Murmurs (mild MR) MR  Rhythm:Regular Rate:Normal  Echo 02/2022 1. Left ventricular ejection fraction, by estimation, is 50 to 55%. The  left ventricle has low normal function. The left ventricle has no regional  wall motion abnormalities. The left ventricular internal cavity size was  mildly dilated. There is moderate  left ventricular hypertrophy. Left ventricular diastolic parameters are  consistent with Grade II diastolic dysfunction (pseudonormalization).  Elevated left atrial pressure.  2. Right ventricular systolic function is mildly reduced. The right  ventricular size is mildly enlarged. There is moderately elevated pulmonary artery systolic pressure. The estimated right ventricular  systolic pressure is 99.2 mmHg.  3. Left atrial size was moderately dilated.  4. Right atrial size was mildly dilated.  5. The mitral valve is normal in structure. Mild mitral valve  regurgitation. No evidence of mitral stenosis.  6. Tricuspid valve regurgitation is moderate to severe.  7. The aortic valve is tricuspid. Aortic valve regurgitation is trivial. Aortic valve sclerosis/calcification is present, without  any evidence of aortic stenosis.  8. Aortic dilatation noted. There is mild dilatation of the ascending aorta, measuring 38 mm.  9. The inferior vena cava is dilated in size with <50% respiratory variability, suggesting right atrial pressure of 15 mmHg.    Neuro/Psych  Headaches, negative psych ROS   GI/Hepatic (+)     substance abuse (heroin)  marijuana use, Hepatitis - (HCV treated), CHx CRC 2014    Endo/Other  negative endocrine ROS  Renal/GU ESRF and DialysisRenal disease (HD T/Th/Sat)  negative genitourinary   Musculoskeletal negative musculoskeletal ROS (+) narcotic dependent  Abdominal   Peds  Hematology  (+) Blood dyscrasia, anemia , Hb 4.7 on arrival, up to 6.8 last night but has not been checked since then  S/p 1 unit prbc    Anesthesia Other Findings   Reproductive/Obstetrics negative OB ROS                           Anesthesia Physical Anesthesia Plan  ASA: 4  Anesthesia Plan: MAC   Post-op Pain Management:    Induction:   PONV Risk Score and Plan: 2 and Propofol infusion and TIVA  Airway Management Planned: Natural Airway and Simple Face Mask  Additional Equipment: None  Intra-op Plan:   Post-operative Plan:   Informed Consent: I have reviewed the patients History and Physical, chart, labs and discussed the procedure including the risks, benefits and alternatives for the proposed anesthesia with the patient or authorized representative who has indicated his/her understanding and acceptance.     Dental advisory given  Plan Discussed with: CRNA  Anesthesia Plan Comments:        Anesthesia Quick Evaluation

## 2022-07-29 DIAGNOSIS — K921 Melena: Secondary | ICD-10-CM

## 2022-07-29 DIAGNOSIS — D62 Acute posthemorrhagic anemia: Secondary | ICD-10-CM

## 2022-07-29 LAB — CBC
HCT: 22.1 % — ABNORMAL LOW (ref 39.0–52.0)
Hemoglobin: 7.3 g/dL — ABNORMAL LOW (ref 13.0–17.0)
MCH: 29.6 pg (ref 26.0–34.0)
MCHC: 33 g/dL (ref 30.0–36.0)
MCV: 89.5 fL (ref 80.0–100.0)
Platelets: 261 10*3/uL (ref 150–400)
RBC: 2.47 MIL/uL — ABNORMAL LOW (ref 4.22–5.81)
RDW: 19 % — ABNORMAL HIGH (ref 11.5–15.5)
WBC: 6.7 10*3/uL (ref 4.0–10.5)
nRBC: 0 % (ref 0.0–0.2)

## 2022-07-29 LAB — TYPE AND SCREEN
ABO/RH(D): B POS
Antibody Screen: NEGATIVE
Unit division: 0
Unit division: 0
Unit division: 0

## 2022-07-29 LAB — RENAL FUNCTION PANEL
Albumin: 2.8 g/dL — ABNORMAL LOW (ref 3.5–5.0)
Anion gap: 22 — ABNORMAL HIGH (ref 5–15)
BUN: 89 mg/dL — ABNORMAL HIGH (ref 8–23)
CO2: 17 mmol/L — ABNORMAL LOW (ref 22–32)
Calcium: 7.2 mg/dL — ABNORMAL LOW (ref 8.9–10.3)
Chloride: 93 mmol/L — ABNORMAL LOW (ref 98–111)
Creatinine, Ser: 10.62 mg/dL — ABNORMAL HIGH (ref 0.61–1.24)
GFR, Estimated: 5 mL/min — ABNORMAL LOW (ref >60)
Glucose, Bld: 82 mg/dL (ref 70–99)
Phosphorus: 7.8 mg/dL — ABNORMAL HIGH (ref 2.5–4.6)
Potassium: 4.9 mmol/L (ref 3.5–5.1)
Sodium: 132 mmol/L — ABNORMAL LOW (ref 135–145)

## 2022-07-29 LAB — BPAM RBC
Blood Product Expiration Date: 202310232359
Blood Product Expiration Date: 202310242359
Blood Product Expiration Date: 202310272359
ISSUE DATE / TIME: 202310120526
ISSUE DATE / TIME: 202310120844
ISSUE DATE / TIME: 202310130821
Unit Type and Rh: 7300
Unit Type and Rh: 7300
Unit Type and Rh: 7300

## 2022-07-29 LAB — FERRITIN: Ferritin: 84 ng/mL (ref 24–336)

## 2022-07-29 LAB — IRON AND TIBC
Iron: 44 ug/dL — ABNORMAL LOW (ref 45–182)
Saturation Ratios: 12 % — ABNORMAL LOW (ref 17.9–39.5)
TIBC: 356 ug/dL (ref 250–450)
UIBC: 312 ug/dL

## 2022-07-29 LAB — FOLATE: Folate: 20 ng/mL (ref >5.9)

## 2022-07-29 MED ORDER — DARBEPOETIN ALFA 200 MCG/0.4ML IJ SOSY: 200.0000 ug | PREFILLED_SYRINGE | INTRAMUSCULAR | Status: DC

## 2022-07-29 NOTE — Progress Notes (Signed)
PROGRESS NOTE        PATIENT DETAILS Name: Timothy [name] Age: 61 y.o. Sex: male Date of Birth: [birth year] Admit Date: 07/26/2022 Admitting Physician Christel Mormon, MD [id number], Iona Beard, MD  Brief Summary: Patient is a 61 y.o.  male with history of ESRD on HD TTS, history of duodenal ulcer requiring IR embolization on 11/16/2021, recurrent GI bleeding requiring numerous hospitalizations and multiple endoscopic evaluations-just discharged from 9/27-referred to the ED from HD clinic due to severe anemia with a hemoglobin of 4.7.  Per patient-since his most recent discharge on 9/27-his stools have been black.  Significant events: 9/18-9/27>> hospitalization for presumed GI bleeding-CTA showed extravasation at splenic flexure, however EGD/colonoscopy were negative.  Required 3 units of PRBC. 10/11>> referred to the ED from HD clinic for hemoglobin of 4.7.  Brown stools per patient.  Significant studies: 10/12>> CXR: No obvious PNA.  Significant microbiology data: None  Procedures: 10/13>> EGD: Blood in the second portion of duodenum near previously placed clips  Consults: GI Renal  Subjective: No major issues overnight Brown stools per patient.  Objective: Vitals: Blood pressure (!) 145/91, pulse 70, temperature 97.6 F (36.4 C), temperature source Oral, resp. rate 20, height '6\' 1"'$  (1.854 m), weight 77.1 kg, SpO2 95 %.   Exam: Gen Exam:Alert awake-not in any distress HEENT:atraumatic, normocephalic Chest: B/L clear to auscultation anteriorly CVS:S1S2 regular Abdomen:soft non tender, non distended Extremities:no edema Neurology: Non focal Skin: no rash   Pertinent Labs/Radiology:    Latest Ref Rng & Units 07/28/2022    3:31 PM 07/28/2022   12:00 PM 07/27/2022    9:27 PM  CBC  WBC 4.0 - 10.5 K/uL 6.6     Hemoglobin 13.0 - 17.0 g/dL 7.7  8.8  6.8   Hematocrit 39.0 - 52.0 % 22.7  26.0  20.7   Platelets 150 - 400 K/uL 295       Lab  Results  Component Value Date   NA 137 07/28/2022   K 4.6 07/28/2022   CL 97 (L) 07/28/2022   CO2 24 07/28/2022      Assessment/Plan: Severe anemia Recent GI bleeding with acute blood loss anemia EGD on 10/13 with stigmata of recent bleeding Hemoglobin now stable after 3 units of PRBC transfusion No active/ongoing bleeding Awaiting MRI to rule out mass as a source of bleeding from ampullary area GI following Remains on PPI  ESRD on HD TTS Nephrology following Iron/EPO defer to nephrology service  Anemia Multifactorial-from acute blood loss-superimposed on anemia of ESRD S/p 3 units of PRBC Follow CBC  Chronic HFpEF Euvolemic Volume removal with HD.  PAF Maintaining sinus rhythm Continue Coreg/diltiazem Not a candidate for anticoagulation given recurrent GI bleeding  HTN BP stable with Coreg, diltiazem, losartan  History of thoracic aortic aneurysm Continue outpatient surveillance  History of colon cancer-s/p right hemi-colectomy 2014 Colonoscopy x2 this year negative for recurrence  History of HCV Treated-undetectable September 2022  BMI: Estimated body mass index is 22.43 kg/m as calculated from the following:   Height as of this encounter: '6\' 1"'$  (1.854 m).   Weight as of this encounter: 77.1 kg.   Code status:   Code Status: Full Code   DVT Prophylaxis: SCDs Start: 07/27/22 0524   Family Communication:  None at bedside   Disposition Plan: Status is: Inpatient Remains inpatient appropriate because: Recurrent GI bleeding with acute  blood loss anemia-awaiting MRI abdomen   Planned Discharge Destination:Home hopefully in the next day or so once work-up is complete.   Diet: Diet Order             Diet renal/carb modified with fluid restriction Diet-HS Snack? Nothing; Fluid restriction: 1200 mL Fluid; Room service appropriate? Yes; Fluid consistency: Thin  Diet effective now                     Antimicrobial agents: Anti-infectives  (From admission, onward)    None        MEDICATIONS: Scheduled Meds:  sodium chloride   Intravenous Once   carvedilol  3.125 mg Oral BID WC   Chlorhexidine Gluconate Cloth  6 each Topical Q0600   Chlorhexidine Gluconate Cloth  6 each Topical Q0600   diltiazem  360 mg Oral Daily   doxercalciferol  2 mcg Intravenous Q T,Th,Sa-HD   fluticasone  2 spray Each Nare Daily   gabapentin  100 mg Oral Q T,Th,Sat-1800   hydrALAZINE  25 mg Oral Q6H   lidocaine-prilocaine   Topical Q T,Th,Sa-HD   losartan  25 mg Oral Daily   montelukast  10 mg Oral QHS   multivitamin  1 tablet Oral QHS   [START ON 07/30/2022] pantoprazole  40 mg Intravenous Q12H   sevelamer carbonate  1,600 mg Oral TID WC   Continuous Infusions:  [START ON 08/01/2022] ferric gluconate (FERRLECIT) IVPB     pantoprazole 8 mg/hr (07/27/22 2323)   PRN Meds:.acetaminophen **OR** acetaminophen, albuterol, magnesium hydroxide, ondansetron **OR** ondansetron (ZOFRAN) IV, oxyCODONE, sodium chloride, traZODone   I have personally reviewed following labs and imaging studies  LABORATORY DATA: CBC: Recent Labs  Lab 07/27/22 0409 07/27/22 2127 07/28/22 1200 07/28/22 1531  WBC 7.0  --   --  6.6  NEUTROABS 5.4  --   --   --   HGB 4.7* 6.8* 8.8* 7.7*  HCT 15.8* 20.7* 26.0* 22.7*  MCV 101.3*  --   --  89.0  PLT 319  --   --  295     Basic Metabolic Panel: Recent Labs  Lab 07/27/22 0409 07/28/22 1200 07/28/22 1531  NA 144 135 137  K 4.1 4.6 4.6  CL 97* 96* 97*  CO2 29  --  24  GLUCOSE 93 90 89  BUN 70* 75* 75*  CREATININE 8.28* 9.50* 8.81*  CALCIUM 7.8*  --  7.4*  PHOS  --   --  7.1*     GFR: Estimated Creatinine Clearance: 9.6 mL/min (A) (by C-G formula based on SCr of 8.81 mg/dL (H)).  Liver Function Tests: Recent Labs  Lab 07/27/22 0409 07/28/22 1531  AST 25  --   ALT 17  --   ALKPHOS 104  --   BILITOT 0.6  --   PROT 6.9  --   ALBUMIN 2.8* 2.9*    No results for input(s): "LIPASE", "AMYLASE" in  the last 168 hours. No results for input(s): "AMMONIA" in the last 168 hours.  Coagulation Profile: No results for input(s): "INR", "PROTIME" in the last 168 hours.  Cardiac Enzymes: No results for input(s): "CKTOTAL", "CKMB", "CKMBINDEX", "TROPONINI" in the last 168 hours.  BNP (last 3 results) No results for input(s): "PROBNP" in the last 8760 hours.  Lipid Profile: No results for input(s): "CHOL", "HDL", "LDLCALC", "TRIG", "CHOLHDL", "LDLDIRECT" in the last 72 hours.  Thyroid Function Tests: No results for input(s): "TSH", "T4TOTAL", "FREET4", "T3FREE", "THYROIDAB" in the last 72 hours.  Anemia Panel: No results for input(s): "VITAMINB12", "FOLATE", "FERRITIN", "TIBC", "IRON", "RETICCTPCT" in the last 72 hours.  Urine analysis:    Component Value Date/Time   COLORURINE YELLOW 05/19/2021 2015   APPEARANCEUR HAZY (A) 05/19/2021 2015   LABSPEC 1.012 05/19/2021 2015   PHURINE 9.0 (H) 05/19/2021 2015   GLUCOSEU NEGATIVE 05/19/2021 2015   HGBUR NEGATIVE 05/19/2021 2015   [place] NEGATIVE 05/19/2021 2015   KETONESUR NEGATIVE 05/19/2021 2015   PROTEINUR >=300 (A) 05/19/2021 2015   NITRITE NEGATIVE 05/19/2021 2015   LEUKOCYTESUR SMALL (A) 05/19/2021 2015    Sepsis Labs: Lactic Acid, Venous    Component Value Date/Time   LATICACIDVEN 3.4 ([place]) 04/07/2022 0913    MICROBIOLOGY: Recent Results (from the past 240 hour(s))  MRSA Next Gen by PCR, Nasal     Status: None   Collection Time: 07/27/22  8:50 PM   Specimen: Nasal Mucosa; Nasal Swab  Result Value Ref Range Status   MRSA by PCR Next Gen NOT DETECTED NOT DETECTED Final    Comment: (NOTE) The GeneXpert MRSA Assay (FDA approved for NASAL specimens only), is one component of a comprehensive MRSA colonization surveillance program. It is not intended to diagnose MRSA infection nor to guide or monitor treatment for MRSA infections. Test performance is not FDA approved in patients less than 60 years old. Performed at  [facility], [place] [address], [place], [place] [address]     RADIOLOGY STUDIES/RESULTS: No results found.   LOS: 2 days   Oren Binet, MD  [facility]    To contact the attending provider between 7A-7P or the covering provider during after hours 7P-7A, please log into the web site [email or web address] and access using universal [place] password for that web site. If you do not have the password, please call the hospital operator.  07/29/2022, 10:09 AM

## 2022-07-29 NOTE — Progress Notes (Addendum)
Daily Progress Note  Hospital Day: 4  Chief Complaint: anemia, heme + stool  Brief History 61 yo male with pmh significant for ESRD on HD TTS, eradicated HCV, thoracic aortic aneurysm, CHF, chronic anemia, bleeding duodenal ulcer requiring embolization Jan 2023, colon cancer in 2014 s/p R hemicolectomy.  Admitted 10/12 for low hgb found at dialysis. Seen in consultation by [place] on 10/12  Assessment / Plan  # 61 yo male with history of a duodenal ulcer requiring embolization in 2023. Admitted now with acute on chronic anemia ( hgb 7.4 >> 4.7) / FOBT+  Small bowel enteroscopy >Clotted blood was found in the second portion of the duodenum near the previously placed clip at ampulla. This suggests that this area had been recently bleeding.  Awaiting MRCP to assess for an ampulla mass as source for bleeding.   S/p 3 u blood. Hgb improved to 7.3.  Continue  PPI infusion, will transition to BID tomorrow. Ferritin okay at 84.  TIBC okay at 356 but only 12% iron sat. Normal folate. Erythropoietin level pending.    # Remote history of colon cancer, s/p R hemicolectomy. No recurrent cancers on recent colonoscopy     Subjective   Awaiting dialysis  Objective   Endoscopic studies:   Small bowel endoscopy August 16, 2022 - Normal esophagus. - Gastritis. - Duodenal bulb with coils from prior embolization. - Clips in 2nd portion of the duodenum - Blood in the second portion of the duodenum near previously placed clip, cleared without re-accumulation. - The examined portion of the jejunum was normal. - No specimens collected   Imaging:  DG Chest Port 1 View  Result Date: 07/27/2022 CLINICAL DATA:  61 year old male with weakness. EXAM: PORTABLE CHEST 1 VIEW COMPARISON:  CT Abdomen and Pelvis 07/05/2022 and earlier. FINDINGS: Portable AP supine view at 0352 hours. Streaky, linear atelectasis or scarring at the right lower lung does not appear significantly changed from last month. Borderline to  mild cardiomegaly. Calcified aortic atherosclerosis. Stable cardiac size and mediastinal contours. Visualized tracheal air column is within normal limits. Difficult to exclude asymmetric increased background interstitial opacity in the right lung, but favor artifact instead (the right chest soft tissues also appear denser). No pneumothorax, pleural effusion or consolidation. IMPRESSION: 1. Difficult to exclude asymmetric right lung interstitial opacity or edema, but favor artifact due to portable technique instead. 2. Otherwise stable atelectasis or scarring in the right lower lung and mild cardiomegaly. Electronically Signed   By: Genevie Ann M.D.   On: 07/27/2022 04:25    Lab Results: Recent Labs    07/27/22 0409 07/27/22 2127 2022-08-16 1200 08/16/2022 1531 07/29/22 1154  WBC 7.0  --   --  6.6 6.7  HGB 4.7*   < > 8.8* 7.7* 7.3*  HCT 15.8*   < > 26.0* 22.7* 22.1*  PLT 319  --   --  295 261   < > = values in this interval not displayed.   BMET Recent Labs    07/27/22 0409 August 16, 2022 1200 08/16/22 1531  NA 144 135 137  K 4.1 4.6 4.6  CL 97* 96* 97*  CO2 29  --  24  GLUCOSE 93 90 89  BUN 70* 75* 75*  CREATININE 8.28* 9.50* 8.81*  CALCIUM 7.8*  --  7.4*   LFT Recent Labs    07/27/22 0409 Aug 16, 2022 1531  PROT 6.9  --   ALBUMIN 2.8* 2.9*  AST 25  --   ALT 17  --  ALKPHOS 104  --   BILITOT 0.6  --   BILIDIR 0.2  --   IBILI 0.4  --    PT/INR No results for input(s): "LABPROT", "INR" in the last 72 hours.   Scheduled inpatient medications:   sodium chloride   Intravenous Once   carvedilol  3.125 mg Oral BID WC   Chlorhexidine Gluconate Cloth  6 each Topical Q0600   Chlorhexidine Gluconate Cloth  6 each Topical Q0600   [START ON 08/03/2022] darbepoetin (ARANESP) injection - DIALYSIS  200 mcg Intravenous Q Thu-HD   diltiazem  360 mg Oral Daily   doxercalciferol  2 mcg Intravenous Q T,Th,Sa-HD   fluticasone  2 spray Each Nare Daily   gabapentin  100 mg Oral Q T,Th,Sat-1800    hydrALAZINE  25 mg Oral Q6H   lidocaine-prilocaine   Topical Q T,Th,Sa-HD   losartan  25 mg Oral Daily   montelukast  10 mg Oral QHS   multivitamin  1 tablet Oral QHS   [START ON 07/30/2022] pantoprazole  40 mg Intravenous Q12H   sevelamer carbonate  1,600 mg Oral TID WC   Continuous inpatient infusions:   [START ON 08/01/2022] ferric gluconate (FERRLECIT) IVPB     pantoprazole 8 mg/hr (07/27/22 2323)   PRN inpatient medications: acetaminophen **OR** acetaminophen, albuterol, magnesium hydroxide, ondansetron **OR** ondansetron (ZOFRAN) IV, oxyCODONE, sodium chloride, traZODone  Vital signs in last 24 hours: Temp:  [97.6 F (36.4 C)-98.4 F (36.9 C)] 97.6 F (36.4 C) (10/14 0900) Pulse Rate:  [65-78] 65 (10/14 1133) Resp:  [16-21] 20 (10/14 1133) BP: (136-147)/(81-93) 147/93 (10/14 1133) SpO2:  [92 %-96 %] 96 % (10/14 1133) Last BM Date : 07/28/22 No intake or output data in the 24 hours ending 07/29/22 1313  Intake/Output from previous day: 10/13 0701 - 10/14 0700 In: 536.7 [I.V.:50; Blood:486.7] Out: 0  Intake/Output this shift: No intake/output data recorded.   Physical Exam:  General: Alert male in NAD in bedside chair Heart:  Regular rate and rhythm.  Pulmonary: Normal respiratory effort Abdomen: Soft, protuberant, nontender. Normal bowel sounds.  Neurologic: Alert and oriented Psych: Pleasant. Cooperative.    Principal Problem:   GI bleeding Active Problems:   Acute on chronic combined systolic and diastolic CHF (congestive heart failure) (HCC)   HTN (hypertension)   ESRD on HD TTS   Paroxysmal atrial fibrillation (HCC)   Heme positive stool   Symptomatic anemia     LOS: 2 days   Timothy [name] ,NP 07/29/2022, 1:13 PM    I have taken an interval history, thoroughly reviewed the chart and examined the patient. I agree with the Advanced Practitioner's note, impression and recommendations, and have recorded additional findings, impressions and  recommendations below. I performed a substantive portion of this encounter (>50% time spent), including a complete performance of the medical decision making.  My additional thoughts are as follows:  Recurrent small bowel bleeding.  Dr. Lorenso Courier ordered an MRI/MRCP to rule out a pancreatic mass.  We are awaiting that, but I suspect that will not be the finding since he has had multiple CTs and CTAs of the abdomen recently.  The CTAs should have been able to discover some aneurysm or other vascular anatomy around the pancreatic head/proximal duodenum. From what I know of him previous endoscopic findings, I am suspicious that there is an AVM or other vascular finding in the second portion of the duodenum near the papilla. If MR study is unrevealing, he will probably need a repeat upper endoscopy  with side-viewing scope to better visualize that area and see if there is a lesion amenable to endoscopic intervention.  Nelida Meuse III Office:[phone]

## 2022-07-29 NOTE — Progress Notes (Signed)
Patient NPO since 1900 with the anticipation of going to MRI at 0100. This RN called to confirm that patient would be taken at 0100 and MRI tech verified time. Patient taken down to MRI and tech states, "I don't know who told you to come at 0100. I am not going to be able to take this patient." She states that there may be time at 0900 but patient refused as he would have to be NPO again and has HD session. This info will be passed to oncoming RN.

## 2022-07-29 NOTE — Progress Notes (Signed)
Subjective: No complaints in bed, for dialysis today on schedule also MRI per GI RO mass source of bleeding from ampullary area  Objective Vital signs in last 24 hours: Vitals:   07/28/22 2338 07/29/22 0418 07/29/22 0900 07/29/22 1133  BP: 136/89 (!) 144/85 (!) 145/91 (!) 147/93  Pulse: 75 78 70 65  Resp: '16 20  20  '$ Temp: 98.4 F (36.9 C) 97.6 F (36.4 C) 97.6 F (36.4 C)   TempSrc: Oral Oral Oral   SpO2: 92% 95% 95% 96%  Weight:      Height:       Weight change:   Physical Exam: General: Awake and then alert and in NAD Heart: RRR no MRG Lungs: Breath sounds bilaterally nonlabored breathing Abdomen: NABS, soft NTND Extremities: No pedal edema  dialysis Access: Positive bruit L UA aVF  Home meds include - albuterol, coreg 3.125 bid, diltiazem 360 qd, gabapentin 100 tts, hydralazine 25 qid, losartan 25 qd, singulair, renavit, oxycodone prn, pantoprazole, sevelamer carb 2 ac tid, prns/ vits/ supps    [facility]: [facility]  3h 78mn  450/ 1.5  71kg  2/2 bath  P4  Hep none   LUA AVF - last HD 10/10, post wt 73.1kg - last Hb 5.0 on 10/10 - mircera 225 q2, last 10/5 - iron sucrose '50mg'$  q wk, last 10/5 - hectorol 2 ug IV tiw tts  Problem/Plan: Anemia/recent GI bleed with acute blood loss anemia= consulting EGD 10/13= stigmata of recent bleed MRI ordered to rule out mass source of bleed from ampullary area 3 units packed red blood cells so far Hgb 4. 7 to 7.7 a.m. hemoglobin pending/no heparin HD ESRD -HD TTS schedule tolerated Thursday with no drop in blood pressure 3.4 L UF, HD again today Hypertension/volume = CXR with edema 10/12 , HD today attempt again 3 L UF, BP meds and UF for HTN control Anemia of ESRD/GI bleed-for ESA 10/19(give 200 Aranesp.)  Weekly Venofer on HD-Hgb trend as above-  Secondary hyperparathyroidism - C Ca in range, phosphorus 7.1, binders when taking meals A-fib paroxysmal-regular on my exam today , meds per admit, no  AC with GI bleed on diltiazem rate  control  DErnest Haber PA-C [facility] [phone]/14/2023,11:47 AM  LOS: 2 days   Labs: Basic Metabolic Panel: Recent Labs  Lab 07/27/22 0409 07/28/22 1200 07/28/22 1531  NA 144 135 137  K 4.1 4.6 4.6  CL 97* 96* 97*  CO2 29  --  24  GLUCOSE 93 90 89  BUN 70* 75* 75*  CREATININE 8.28* 9.50* 8.81*  CALCIUM 7.8*  --  7.4*  PHOS  --   --  7.1*   Liver Function Tests: Recent Labs  Lab 07/27/22 0409 07/28/22 1531  AST 25  --   ALT 17  --   ALKPHOS 104  --   BILITOT 0.6  --   PROT 6.9  --   ALBUMIN 2.8* 2.9*   No results for input(s): "LIPASE", "AMYLASE" in the last 168 hours. No results for input(s): "AMMONIA" in the last 168 hours. CBC: Recent Labs  Lab 07/27/22 0409 07/27/22 2127 07/28/22 1200 07/28/22 1531  WBC 7.0  --   --  6.6  NEUTROABS 5.4  --   --   --   HGB 4.7* 6.8* 8.8* 7.7*  HCT 15.8* 20.7* 26.0* 22.7*  MCV 101.3*  --   --  89.0  PLT 319  --   --  295   Cardiac Enzymes:  No results for input(s): "CKTOTAL", "CKMB", "CKMBINDEX", "TROPONINI" in the last 168 hours. CBG: No results for input(s): "GLUCAP" in the last 168 hours.  Studies/Results: No results found. Medications:  [START ON 08/01/2022] ferric gluconate (FERRLECIT) IVPB     pantoprazole 8 mg/hr (07/27/22 2323)    sodium chloride   Intravenous Once   carvedilol  3.125 mg Oral BID WC   Chlorhexidine Gluconate Cloth  6 each Topical Q0600   Chlorhexidine Gluconate Cloth  6 each Topical Q0600   [START ON 08/03/2022] darbepoetin (ARANESP) injection - DIALYSIS  200 mcg Intravenous Q Thu-HD   diltiazem  360 mg Oral Daily   doxercalciferol  2 mcg Intravenous Q T,Th,Sa-HD   fluticasone  2 spray Each Nare Daily   gabapentin  100 mg Oral Q T,Th,Sat-1800   hydrALAZINE  25 mg Oral Q6H   lidocaine-prilocaine   Topical Q T,Th,Sa-HD   losartan  25 mg Oral Daily   montelukast  10 mg Oral QHS   multivitamin  1 tablet Oral QHS   [START ON 07/30/2022] pantoprazole  40 mg  Intravenous Q12H   sevelamer carbonate  1,600 mg Oral TID WC

## 2022-07-30 ENCOUNTER — Inpatient Hospital Stay (HOSPITAL_COMMUNITY): Payer: Medicare Other

## 2022-07-30 DIAGNOSIS — D500 Iron deficiency anemia secondary to blood loss (chronic): Secondary | ICD-10-CM

## 2022-07-30 LAB — ERYTHROPOIETIN: Erythropoietin: 101.2 m[IU]/mL — ABNORMAL HIGH (ref 2.6–18.5)

## 2022-07-30 LAB — RENAL FUNCTION PANEL
Albumin: 2.8 g/dL — ABNORMAL LOW (ref 3.5–5.0)
Anion gap: 14 (ref 5–15)
BUN: 51 mg/dL — ABNORMAL HIGH (ref 8–23)
CO2: 27 mmol/L (ref 22–32)
Calcium: 7.5 mg/dL — ABNORMAL LOW (ref 8.9–10.3)
Chloride: 90 mmol/L — ABNORMAL LOW (ref 98–111)
Creatinine, Ser: 6.76 mg/dL — ABNORMAL HIGH (ref 0.61–1.24)
GFR, Estimated: 9 mL/min — ABNORMAL LOW (ref >60)
Glucose, Bld: 92 mg/dL (ref 70–99)
Phosphorus: 5.5 mg/dL — ABNORMAL HIGH (ref 2.5–4.6)
Potassium: 3.6 mmol/L (ref 3.5–5.1)
Sodium: 131 mmol/L — ABNORMAL LOW (ref 135–145)

## 2022-07-30 LAB — CBC
HCT: 22.7 % — ABNORMAL LOW (ref 39.0–52.0)
Hemoglobin: 7.4 g/dL — ABNORMAL LOW (ref 13.0–17.0)
MCH: 29.5 pg (ref 26.0–34.0)
MCHC: 32.6 g/dL (ref 30.0–36.0)
MCV: 90.4 fL (ref 80.0–100.0)
Platelets: 277 10*3/uL (ref 150–400)
RBC: 2.51 MIL/uL — ABNORMAL LOW (ref 4.22–5.81)
RDW: 18.8 % — ABNORMAL HIGH (ref 11.5–15.5)
WBC: 6 10*3/uL (ref 4.0–10.5)
nRBC: 0 % (ref 0.0–0.2)

## 2022-07-30 MED ORDER — HYDRALAZINE HCL 20 MG/ML IJ SOLN: 10.0000 mg | Freq: Four times a day (QID) | INTRAMUSCULAR | Status: DC | PRN

## 2022-07-30 MED ORDER — CARVEDILOL 6.25 MG PO TABS
6.2500 mg | ORAL_TABLET | Freq: Two times a day (BID) | ORAL | Status: DC
  Administered 2022-07-31 – 2022-08-02 (×4): 6.25 mg via ORAL
  Filled 2022-07-30 (×5): qty 1

## 2022-07-30 MED ORDER — CARVEDILOL 3.125 MG PO TABS
3.1250 mg | ORAL_TABLET | ORAL | Status: AC
  Administered 2022-07-30: 3.125 mg via ORAL
  Filled 2022-07-30: qty 1

## 2022-07-30 NOTE — Progress Notes (Addendum)
Daily Progress Note  Hospital Day: 5  Chief Complaint: anemia, heme + stool  Brief History 61 yo male with pmh significant for ESRD on HD TTS, eradicated HCV, thoracic aortic aneurysm, CHF, chronic anemia, bleeding duodenal ulcer requiring embolization Jan 2023, colon cancer in 2014 s/p R hemicolectomy.  Admitted 10/12 for low hgb found at dialysis. Seen in consultation by [place] on 10/12   Assessment / Plan   # 61 yo male with history of a duodenal ulcer requiring embolization in 2023. Admitted now with acute on chronic anemia ( hgb 7.4 >> 4.7) / FOBT+  Small bowel enteroscopy >Clotted blood was found in the second portion of the duodenum near the previously placed clip at ampulla. This suggests that this area had been recently bleeding.  Still awaiting MRCP to assess for an ampulla mass as source for bleeding.  If MR study is unrevealing he will probably need a repeat upper endoscopy with side-viewing scope to better visualize that area and see if there is a lesion amenable to endoscopic intervention.  Continue BID PPI.  Ferritin okay at 84.  TIBC okay at 356 but only 12% iron sat. Normal folate. Erythropoietin level pending.  This admission he has gotten 3 units of blood. Today's CBC is pending.  No BMs / overt GI bleeding today   # Remote history of colon cancer, s/p R hemicolectomy. No recurrent cancers on recent colonoscopy    Subjective   Awoken from sleep. Feels okay. Hasn't had a BM / overt GI bleeding today   Objective   Imaging:  DG Chest Port 1 View  Result Date: 07/27/2022 CLINICAL DATA:  61 year old male with weakness. EXAM: PORTABLE CHEST 1 VIEW COMPARISON:  CT Abdomen and Pelvis 07/05/2022 and earlier. FINDINGS: Portable AP supine view at 0352 hours. Streaky, linear atelectasis or scarring at the right lower lung does not appear significantly changed from last month. Borderline to mild cardiomegaly. Calcified aortic atherosclerosis. Stable cardiac size and  mediastinal contours. Visualized tracheal air column is within normal limits. Difficult to exclude asymmetric increased background interstitial opacity in the right lung, but favor artifact instead (the right chest soft tissues also appear denser). No pneumothorax, pleural effusion or consolidation. IMPRESSION: 1. Difficult to exclude asymmetric right lung interstitial opacity or edema, but favor artifact due to portable technique instead. 2. Otherwise stable atelectasis or scarring in the right lower lung and mild cardiomegaly. Electronically Signed   By: Genevie Ann M.D.   On: 07/27/2022 04:25    Lab Results: Recent Labs    07/28/22 1200 07/28/22 1531 07/29/22 1154  WBC  --  6.6 6.7  HGB 8.8* 7.7* 7.3*  HCT 26.0* 22.7* 22.1*  PLT  --  295 261   BMET Recent Labs    07/28/22 1200 07/28/22 1531 07/29/22 1154  NA 135 137 132*  K 4.6 4.6 4.9  CL 96* 97* 93*  CO2  --  24 17*  GLUCOSE 90 89 82  BUN 75* 75* 89*  CREATININE 9.50* 8.81* 10.62*  CALCIUM  --  7.4* 7.2*   LFT Recent Labs    07/29/22 1154  ALBUMIN 2.8*   PT/INR No results for input(s): "LABPROT", "INR" in the last 72 hours.   Scheduled inpatient medications:   sodium chloride   Intravenous Once   carvedilol  3.125 mg Oral BID WC   Chlorhexidine Gluconate Cloth  6 each Topical Q0600   Chlorhexidine Gluconate Cloth  6 each Topical Q0600   [START ON 08/03/2022]  darbepoetin (ARANESP) injection - DIALYSIS  200 mcg Intravenous Q Thu-HD   diltiazem  360 mg Oral Daily   doxercalciferol  2 mcg Intravenous Q T,Th,Sa-HD   fluticasone  2 spray Each Nare Daily   gabapentin  100 mg Oral Q T,Th,Sat-1800   hydrALAZINE  25 mg Oral Q6H   lidocaine-prilocaine   Topical Q T,Th,Sa-HD   losartan  25 mg Oral Daily   montelukast  10 mg Oral QHS   multivitamin  1 tablet Oral QHS   pantoprazole  40 mg Intravenous Q12H   sevelamer carbonate  1,600 mg Oral TID WC   Continuous inpatient infusions:   [START ON 08/01/2022] ferric gluconate  (FERRLECIT) IVPB     PRN inpatient medications: acetaminophen **OR** acetaminophen, albuterol, magnesium hydroxide, ondansetron **OR** ondansetron (ZOFRAN) IV, oxyCODONE, sodium chloride, traZODone  Vital signs in last 24 hours: Temp:  [97 F (36.1 C)-98 F (36.7 C)] 97.8 F (36.6 C) (10/15 0317) Pulse Rate:  [59-73] 70 (10/15 0317) Resp:  [10-21] 10 (10/15 0317) BP: (133-186)/(42-107) 184/86 (10/15 0922) SpO2:  [94 %-99 %] 95 % (10/15 0317) Weight:  [76.5 kg-80.3 kg] 76.5 kg (10/15 0053) Last BM Date : 07/28/22  Intake/Output Summary (Last 24 hours) at 07/30/2022 0942 Last data filed at 07/30/2022 0300 Gross per 24 hour  Intake 157.4 ml  Output 3810 ml  Net -3652.6 ml    Intake/Output from previous day: 10/14 0701 - 10/15 0700 In: 157.4 [I.V.:157.4] Out: 3810  Intake/Output this shift: No intake/output data recorded.   Physical Exam:  General: Alert male in NAD Heart:  Regular rate.  Pulmonary: Normal respiratory effort Abdomen: Soft, nondistended, nontender. Normal bowel sounds.  Neurologic: Alert and oriented Psych:  Cooperative.    Principal Problem:   GI bleeding Active Problems:   Acute on chronic combined systolic and diastolic CHF (congestive heart failure) (HCC)   HTN (hypertension)   ESRD on HD TTS   Paroxysmal atrial fibrillation (HCC)   Heme positive stool   Symptomatic anemia     LOS: 3 days   Tye Savoy ,NP 07/30/2022, 9:42 AM    I have taken an interval history, thoroughly reviewed the chart and examined the patient. I agree with the Advanced Practitioner's note, impression and recommendations, and have recorded additional findings, impressions and recommendations below. I performed a substantive portion of this encounter (>50% time spent), including a complete performance of the medical decision making.  My additional thoughts are as follows:  His MRI abdomen/MRCP report was done earlier today with report as follows:  CLINICAL  DATA:  Bleeding from ampulla.   EXAM: MRI ABDOMEN WITHOUT CONTRAST  (INCLUDING MRCP)   TECHNIQUE: Multiplanar multisequence MR imaging of the abdomen was performed. Heavily T2-weighted images of the biliary and pancreatic ducts were obtained, and three-dimensional MRCP images were rendered by post processing.   COMPARISON:  CTA 07/05/2022   FINDINGS: Lower chest: The heart is enlarged.   Hepatobiliary: Decreased signal intensity within the liver parenchyma on T2 imaging suggests iron deposition disease. No focal abnormality seen in the liver parenchyma on noncontrast imaging. 2 cm gallstone evident with smaller stone seen in the neck of the gallbladder. No intra or extrahepatic biliary duct dilatation. Ampullary region is obscured by beam hardening artifact from embolization coils.   Pancreas: Posterior pancreatic head is obscured. Mild main duct prominence although assessment is limited by lack of intravenous contrast material and motion artifact.   Spleen: 1.9 cm T2 hyperintense lesion in the dome of the spleen is  stable since CT of 06/17/2021 suggesting benign etiology but cannot be definitively characterized.   Adrenals/Urinary Tract: No adrenal nodule or mass. Numerous cysts are noted in both kidneys. 3.4 cm mass in the right kidney cannot be definitively characterized on noncontrast imaging but was described as a complex cyst on recent CTA.   Stomach/Bowel: Stomach is unremarkable. No gastric wall thickening. No evidence of outlet obstruction. Duodenum is normally positioned as is the ligament of Treitz. No small bowel or colonic dilatation within the visualized abdomen.   Vascular/Lymphatic: No abdominal aortic aneurysm. There is no gastrohepatic or hepatoduodenal ligament lymphadenopathy. No retroperitoneal or mesenteric lymphadenopathy.   Other: Intraperitoneal free fluid with diffuse mesenteric edema evident.   Musculoskeletal: No overtly suspicious marrow  signal abnormality.   IMPRESSION: 1. Ampullary region is obscured by beam hardening artifact from embolization coils. Posterior pancreatic head is obscured. Mild main duct prominence noted in the pancreas although assessment is limited by lack of intravenous contrast material and motion artifact. 2. Cholelithiasis. 3. Intraperitoneal free fluid with diffuse mesenteric edema evident. 4. 1.9 cm T2 hyperintense lesion in the dome of the spleen is stable since CT of 06/17/2021 suggesting benign etiology although it cannot be definitively characterized on noncontrast imaging. 5. Numerous cysts in both kidneys. 3.4 cm mass in the right kidney cannot be definitively characterized on noncontrast imaging but was described as a complex cyst on recent CTA.     Electronically Signed   By: Misty Stanley M.D.   On: 07/30/2022 13:48 ________________________  No pancreatic mass seen, and one had not been seen on prior cross-sectional imaging studies.  From what I have seen and endoscopic reports, this patient's bleeding area on the scan about 3 weeks ago was adjacent to the major papilla but without blood necessarily coming from the ampulla. There was some fresh blood in that area on the EGD 2 days ago with no source seen after lavage of that area.  This patient's bleeding source seems either to be an incompletely visualized lesion near the major papilla or perhaps a more distal small bowel lesion.  I do not think he has had a video capsule study, or at least I certainly cannot find the report of one nor mention of it in prior GI consults.  I think he needs an upper endoscopy tomorrow with an viewing but also probably second pass with a side-viewing scope for better visualization of the periampullary area.  He was agreeable after discussion of procedure and risks.  The benefits and risks of the planned procedure were described in detail with the patient or (when appropriate) their health care proxy.   Risks were outlined as including, but not limited to, bleeding, infection, perforation, adverse medication reaction leading to cardiac or pulmonary decompensation, pancreatitis (if ERCP).  The limitation of incomplete mucosal visualization was also discussed.  No guarantees or warranties were given.  I will sign this out to my partner Dr. Fuller Plan who will assume management of the consult service tomorrow.  Specifically, I will make sure he is aware of the patient's endoscopic findings of visible coils in the duodenal bulb from prior interventions, so he can consider if the side-viewing duodenoscope can be safely passed through that area.  If there is no source of bleeding seen on that study, then he needs a small bowel video capsule study.  Whether or not he will agree to stay for that remains to be seen, as he is upset about another hospitalization.   Nelida Meuse  III [phone]

## 2022-07-30 NOTE — Progress Notes (Signed)
Pt refused to be on telemetry.Informed MD

## 2022-07-30 NOTE — H&P (View-Only) (Signed)
Daily Progress Note  Hospital Day: 5  Chief Complaint: anemia, heme + stool  Brief History 61 yo male with pmh significant for ESRD on HD TTS, eradicated HCV, thoracic aortic aneurysm, CHF, chronic anemia, bleeding duodenal ulcer requiring embolization Jan 2023, colon cancer in 2014 s/p R hemicolectomy.  Admitted 10/12 for low hgb found at dialysis. Seen in consultation by [place] on 10/12   Assessment / Plan   # 61 yo male with history of a duodenal ulcer requiring embolization in 2023. Admitted now with acute on chronic anemia ( hgb 7.4 >> 4.7) / FOBT+  Small bowel enteroscopy >Clotted blood was found in the second portion of the duodenum near the previously placed clip at ampulla. This suggests that this area had been recently bleeding.  Still awaiting MRCP to assess for an ampulla mass as source for bleeding.  If MR study is unrevealing he will probably need a repeat upper endoscopy with side-viewing scope to better visualize that area and see if there is a lesion amenable to endoscopic intervention.  Continue BID PPI.  Ferritin okay at 84.  TIBC okay at 356 but only 12% iron sat. Normal folate. Erythropoietin level pending.  This admission he has gotten 3 units of blood. Today's CBC is pending.  No BMs / overt GI bleeding today   # Remote history of colon cancer, s/p R hemicolectomy. No recurrent cancers on recent colonoscopy    Subjective   Awoken from sleep. Feels okay. Hasn't had a BM / overt GI bleeding today   Objective   Imaging:  DG Chest Port 1 View  Result Date: 07/27/2022 CLINICAL DATA:  61 year old male with weakness. EXAM: PORTABLE CHEST 1 VIEW COMPARISON:  CT Abdomen and Pelvis 07/05/2022 and earlier. FINDINGS: Portable AP supine view at 0352 hours. Streaky, linear atelectasis or scarring at the right lower lung does not appear significantly changed from last month. Borderline to mild cardiomegaly. Calcified aortic atherosclerosis. Stable cardiac size and  mediastinal contours. Visualized tracheal air column is within normal limits. Difficult to exclude asymmetric increased background interstitial opacity in the right lung, but favor artifact instead (the right chest soft tissues also appear denser). No pneumothorax, pleural effusion or consolidation. IMPRESSION: 1. Difficult to exclude asymmetric right lung interstitial opacity or edema, but favor artifact due to portable technique instead. 2. Otherwise stable atelectasis or scarring in the right lower lung and mild cardiomegaly. Electronically Signed   By: Genevie Ann M.D.   On: 07/27/2022 04:25    Lab Results: Recent Labs    07/28/22 1200 07/28/22 1531 07/29/22 1154  WBC  --  6.6 6.7  HGB 8.8* 7.7* 7.3*  HCT 26.0* 22.7* 22.1*  PLT  --  295 261   BMET Recent Labs    07/28/22 1200 07/28/22 1531 07/29/22 1154  NA 135 137 132*  K 4.6 4.6 4.9  CL 96* 97* 93*  CO2  --  24 17*  GLUCOSE 90 89 82  BUN 75* 75* 89*  CREATININE 9.50* 8.81* 10.62*  CALCIUM  --  7.4* 7.2*   LFT Recent Labs    07/29/22 1154  ALBUMIN 2.8*   PT/INR No results for input(s): "LABPROT", "INR" in the last 72 hours.   Scheduled inpatient medications:   sodium chloride   Intravenous Once   carvedilol  3.125 mg Oral BID WC   Chlorhexidine Gluconate Cloth  6 each Topical Q0600   Chlorhexidine Gluconate Cloth  6 each Topical Q0600   [START ON 08/03/2022]  darbepoetin (ARANESP) injection - DIALYSIS  200 mcg Intravenous Q Thu-HD   diltiazem  360 mg Oral Daily   doxercalciferol  2 mcg Intravenous Q T,Th,Sa-HD   fluticasone  2 spray Each Nare Daily   gabapentin  100 mg Oral Q T,Th,Sat-1800   hydrALAZINE  25 mg Oral Q6H   lidocaine-prilocaine   Topical Q T,Th,Sa-HD   losartan  25 mg Oral Daily   montelukast  10 mg Oral QHS   multivitamin  1 tablet Oral QHS   pantoprazole  40 mg Intravenous Q12H   sevelamer carbonate  1,600 mg Oral TID WC   Continuous inpatient infusions:   [START ON 08/01/2022] ferric gluconate  (FERRLECIT) IVPB     PRN inpatient medications: acetaminophen **OR** acetaminophen, albuterol, magnesium hydroxide, ondansetron **OR** ondansetron (ZOFRAN) IV, oxyCODONE, sodium chloride, traZODone  Vital signs in last 24 hours: Temp:  [97 F (36.1 C)-98 F (36.7 C)] 97.8 F (36.6 C) (10/15 0317) Pulse Rate:  [59-73] 70 (10/15 0317) Resp:  [10-21] 10 (10/15 0317) BP: (133-186)/(42-107) 184/86 (10/15 0922) SpO2:  [94 %-99 %] 95 % (10/15 0317) Weight:  [76.5 kg-80.3 kg] 76.5 kg (10/15 0053) Last BM Date : 07/28/22  Intake/Output Summary (Last 24 hours) at 07/30/2022 0942 Last data filed at 07/30/2022 0300 Gross per 24 hour  Intake 157.4 ml  Output 3810 ml  Net -3652.6 ml    Intake/Output from previous day: 10/14 0701 - 10/15 0700 In: 157.4 [I.V.:157.4] Out: 3810  Intake/Output this shift: No intake/output data recorded.   Physical Exam:  General: Alert male in NAD Heart:  Regular rate.  Pulmonary: Normal respiratory effort Abdomen: Soft, nondistended, nontender. Normal bowel sounds.  Neurologic: Alert and oriented Psych:  Cooperative.    Principal Problem:   GI bleeding Active Problems:   Acute on chronic combined systolic and diastolic CHF (congestive heart failure) (HCC)   HTN (hypertension)   ESRD on HD TTS   Paroxysmal atrial fibrillation (HCC)   Heme positive stool   Symptomatic anemia     LOS: 3 days   Tye Savoy ,NP 07/30/2022, 9:42 AM    I have taken an interval history, thoroughly reviewed the chart and examined the patient. I agree with the Advanced Practitioner's note, impression and recommendations, and have recorded additional findings, impressions and recommendations below. I performed a substantive portion of this encounter (>50% time spent), including a complete performance of the medical decision making.  My additional thoughts are as follows:  His MRI abdomen/MRCP report was done earlier today with report as follows:  CLINICAL  DATA:  Bleeding from ampulla.   EXAM: MRI ABDOMEN WITHOUT CONTRAST  (INCLUDING MRCP)   TECHNIQUE: Multiplanar multisequence MR imaging of the abdomen was performed. Heavily T2-weighted images of the biliary and pancreatic ducts were obtained, and three-dimensional MRCP images were rendered by post processing.   COMPARISON:  CTA 07/05/2022   FINDINGS: Lower chest: The heart is enlarged.   Hepatobiliary: Decreased signal intensity within the liver parenchyma on T2 imaging suggests iron deposition disease. No focal abnormality seen in the liver parenchyma on noncontrast imaging. 2 cm gallstone evident with smaller stone seen in the neck of the gallbladder. No intra or extrahepatic biliary duct dilatation. Ampullary region is obscured by beam hardening artifact from embolization coils.   Pancreas: Posterior pancreatic head is obscured. Mild main duct prominence although assessment is limited by lack of intravenous contrast material and motion artifact.   Spleen: 1.9 cm T2 hyperintense lesion in the dome of the spleen is  stable since CT of 06/17/2021 suggesting benign etiology but cannot be definitively characterized.   Adrenals/Urinary Tract: No adrenal nodule or mass. Numerous cysts are noted in both kidneys. 3.4 cm mass in the right kidney cannot be definitively characterized on noncontrast imaging but was described as a complex cyst on recent CTA.   Stomach/Bowel: Stomach is unremarkable. No gastric wall thickening. No evidence of outlet obstruction. Duodenum is normally positioned as is the ligament of Treitz. No small bowel or colonic dilatation within the visualized abdomen.   Vascular/Lymphatic: No abdominal aortic aneurysm. There is no gastrohepatic or hepatoduodenal ligament lymphadenopathy. No retroperitoneal or mesenteric lymphadenopathy.   Other: Intraperitoneal free fluid with diffuse mesenteric edema evident.   Musculoskeletal: No overtly suspicious marrow  signal abnormality.   IMPRESSION: 1. Ampullary region is obscured by beam hardening artifact from embolization coils. Posterior pancreatic head is obscured. Mild main duct prominence noted in the pancreas although assessment is limited by lack of intravenous contrast material and motion artifact. 2. Cholelithiasis. 3. Intraperitoneal free fluid with diffuse mesenteric edema evident. 4. 1.9 cm T2 hyperintense lesion in the dome of the spleen is stable since CT of 06/17/2021 suggesting benign etiology although it cannot be definitively characterized on noncontrast imaging. 5. Numerous cysts in both kidneys. 3.4 cm mass in the right kidney cannot be definitively characterized on noncontrast imaging but was described as a complex cyst on recent CTA.     Electronically Signed   By: Misty Stanley M.D.   On: 07/30/2022 13:48 ________________________  No pancreatic mass seen, and one had not been seen on prior cross-sectional imaging studies.  From what I have seen and endoscopic reports, this patient's bleeding area on the scan about 3 weeks ago was adjacent to the major papilla but without blood necessarily coming from the ampulla. There was some fresh blood in that area on the EGD 2 days ago with no source seen after lavage of that area.  This patient's bleeding source seems either to be an incompletely visualized lesion near the major papilla or perhaps a more distal small bowel lesion.  I do not think he has had a video capsule study, or at least I certainly cannot find the report of one nor mention of it in prior GI consults.  I think he needs an upper endoscopy tomorrow with an viewing but also probably second pass with a side-viewing scope for better visualization of the periampullary area.  He was agreeable after discussion of procedure and risks.  The benefits and risks of the planned procedure were described in detail with the patient or (when appropriate) their health care proxy.   Risks were outlined as including, but not limited to, bleeding, infection, perforation, adverse medication reaction leading to cardiac or pulmonary decompensation, pancreatitis (if ERCP).  The limitation of incomplete mucosal visualization was also discussed.  No guarantees or warranties were given.  I will sign this out to my partner Dr. Fuller Plan who will assume management of the consult service tomorrow.  Specifically, I will make sure he is aware of the patient's endoscopic findings of visible coils in the duodenal bulb from prior interventions, so he can consider if the side-viewing duodenoscope can be safely passed through that area.  If there is no source of bleeding seen on that study, then he needs a small bowel video capsule study.  Whether or not he will agree to stay for that remains to be seen, as he is upset about another hospitalization.   Nelida Meuse  III Office:[phone]

## 2022-07-30 NOTE — Progress Notes (Addendum)
PROGRESS NOTE        PATIENT DETAILS Name: Timothy [name] Age: 61 y.o. Sex: male Date of Birth: 1961-08-10 Admit Date: 07/26/2022 Admitting Physician Christel Mormon, MD [id number], Iona Beard, MD  Brief Summary: Patient is a 61 y.o.  male with history of ESRD on HD TTS, history of duodenal ulcer requiring IR embolization on 11/16/2021, recurrent GI bleeding requiring numerous hospitalizations and multiple endoscopic evaluations-just discharged from 9/27-referred to the ED from HD clinic due to severe anemia with a hemoglobin of 4.7.  Per patient-since his most recent discharge on 9/27-his stools have been black.  Significant events: 9/18-9/27>> hospitalization for presumed GI bleeding-CTA showed extravasation at splenic flexure, however EGD/colonoscopy were negative.  Required 3 units of PRBC. 10/11>> referred to the ED from HD clinic for hemoglobin of 4.7.  Brown stools per patient.  Significant studies: 10/12>> CXR: No obvious PNA.  Significant microbiology data: None  Procedures: 10/13>> EGD: Blood in the second portion of duodenum near previously placed clips  Consults: GI Renal  Subjective: No melena/hematochezia  Objective: Vitals: Blood pressure (!) 184/86, pulse 70, temperature 97.8 F (36.6 C), temperature source Axillary, resp. rate 10, height '6\' 1"'$  (1.854 m), weight 76.5 kg, SpO2 95 %.   Exam: Gen Exam:Alert awake-not in any distress HEENT:atraumatic, normocephalic Chest: B/L clear to auscultation anteriorly CVS:S1S2 regular Abdomen:soft non tender, non distended Extremities:no edema Neurology: Non focal Skin: no rash   Pertinent Labs/Radiology:    Latest Ref Rng & Units 07/30/2022    9:38 AM 07/29/2022   11:54 AM 07/28/2022    3:31 PM  CBC  WBC 4.0 - 10.5 K/uL 6.0  6.7  6.6   Hemoglobin 13.0 - 17.0 g/dL 7.4  7.3  7.7   Hematocrit 39.0 - 52.0 % 22.7  22.1  22.7   Platelets 150 - 400 K/uL 277  261  295     Lab Results   Component Value Date   NA 131 (L) 07/30/2022   K 3.6 07/30/2022   CL 90 (L) 07/30/2022   CO2 27 07/30/2022      Assessment/Plan: Severe anemia Recent GI bleeding with acute blood loss anemia EGD on 10/13 with stigmata of recent bleeding Hemoglobin stable after 3 units of PRBC transfusion No active/ongoing bleeding Awaiting MRCP to rule out source of bleeding from the ampullary area-GI contemplating second look EGD if MRCP negative.   Continue PPI and follow CBC.   ESRD on HD TTS Nephrology following Iron/EPO defer to nephrology service  Anemia Multifactorial-from acute blood loss-superimposed on anemia of ESRD S/p 3 units of PRBC Follow CBC  Chronic HFpEF Euvolemic Volume removal with HD.  PAF Maintaining sinus rhythm Continue Coreg/diltiazem Not a candidate for anticoagulation given recurrent GI bleeding Note-refused telemetry per nursing staff  HTN BP on the higher side-increase Coreg to 6.25 twice daily Continue diltiazem/losartan. Follow/optimize  History of thoracic aortic aneurysm Continue outpatient surveillance  History of colon cancer-s/p right hemi-colectomy 2014 Colonoscopy x2 this year negative for recurrence  History of HCV Treated-undetectable September 2022  BMI: Estimated body mass index is 22.25 kg/m as calculated from the following:   Height as of this encounter: '6\' 1"'$  (1.854 m).   Weight as of this encounter: 76.5 kg.   Code status:   Code Status: Full Code   DVT Prophylaxis: SCDs Start: 07/27/22 0524   Family Communication:  None  at bedside   Disposition Plan: Status is: Inpatient Remains inpatient appropriate because: Recurrent GI bleeding with acute blood loss anemia-awaiting MRI abdomen   Planned Discharge Destination: Home when GI work-up is completed.   Diet: Diet Order             Diet renal/carb modified with fluid restriction Diet-HS Snack? Nothing; Fluid restriction: 1200 mL Fluid; Room service appropriate?  Yes; Fluid consistency: Thin  Diet effective now                     Antimicrobial agents: Anti-infectives (From admission, onward)    None        MEDICATIONS: Scheduled Meds:  sodium chloride   Intravenous Once   carvedilol  3.125 mg Oral BID WC   Chlorhexidine Gluconate Cloth  6 each Topical Q0600   Chlorhexidine Gluconate Cloth  6 each Topical Q0600   [START ON 08/03/2022] darbepoetin (ARANESP) injection - DIALYSIS  200 mcg Intravenous Q Thu-HD   diltiazem  360 mg Oral Daily   doxercalciferol  2 mcg Intravenous Q T,Th,Sa-HD   fluticasone  2 spray Each Nare Daily   gabapentin  100 mg Oral Q T,Th,Sat-1800   hydrALAZINE  25 mg Oral Q6H   lidocaine-prilocaine   Topical Q T,Th,Sa-HD   losartan  25 mg Oral Daily   montelukast  10 mg Oral QHS   multivitamin  1 tablet Oral QHS   pantoprazole  40 mg Intravenous Q12H   sevelamer carbonate  1,600 mg Oral TID WC   Continuous Infusions:  [START ON 08/01/2022] ferric gluconate (FERRLECIT) IVPB     PRN Meds:.acetaminophen **OR** acetaminophen, albuterol, magnesium hydroxide, ondansetron **OR** ondansetron (ZOFRAN) IV, oxyCODONE, sodium chloride, traZODone   I have personally reviewed following labs and imaging studies  LABORATORY DATA: CBC: Recent Labs  Lab 07/27/22 0409 07/27/22 2127 07/28/22 1200 07/28/22 1531 07/29/22 1154 07/30/22 0938  WBC 7.0  --   --  6.6 6.7 6.0  NEUTROABS 5.4  --   --   --   --   --   HGB 4.7* 6.8* 8.8* 7.7* 7.3* 7.4*  HCT 15.8* 20.7* 26.0* 22.7* 22.1* 22.7*  MCV 101.3*  --   --  89.0 89.5 90.4  PLT 319  --   --  295 261 277     Basic Metabolic Panel: Recent Labs  Lab 07/27/22 0409 07/28/22 1200 07/28/22 1531 07/29/22 1154 07/30/22 0938  NA 144 135 137 132* 131*  K 4.1 4.6 4.6 4.9 3.6  CL 97* 96* 97* 93* 90*  CO2 29  --  24 17* 27  GLUCOSE 93 90 89 82 92  BUN 70* 75* 75* 89* 51*  CREATININE 8.28* 9.50* 8.81* 10.62* 6.76*  CALCIUM 7.8*  --  7.4* 7.2* 7.5*  PHOS  --   --   7.1* 7.8* 5.5*     GFR: Estimated Creatinine Clearance: 12.4 mL/min (A) (by C-G formula based on SCr of 6.76 mg/dL (H)).  Liver Function Tests: Recent Labs  Lab 07/27/22 0409 07/28/22 1531 07/29/22 1154 07/30/22 0938  AST 25  --   --   --   ALT 17  --   --   --   ALKPHOS 104  --   --   --   BILITOT 0.6  --   --   --   PROT 6.9  --   --   --   ALBUMIN 2.8* 2.9* 2.8* 2.8*    No results for input(s): "LIPASE", "AMYLASE"  in the last 168 hours. No results for input(s): "AMMONIA" in the last 168 hours.  Coagulation Profile: No results for input(s): "INR", "PROTIME" in the last 168 hours.  Cardiac Enzymes: No results for input(s): "CKTOTAL", "CKMB", "CKMBINDEX", "TROPONINI" in the last 168 hours.  BNP (last 3 results) No results for input(s): "PROBNP" in the last 8760 hours.  Lipid Profile: No results for input(s): "CHOL", "HDL", "LDLCALC", "TRIG", "CHOLHDL", "LDLDIRECT" in the last 72 hours.  Thyroid Function Tests: No results for input(s): "TSH", "T4TOTAL", "FREET4", "T3FREE", "THYROIDAB" in the last 72 hours.  Anemia Panel: Recent Labs    07/29/22 1154  FOLATE 20.0  FERRITIN 84  TIBC 356  IRON 44*    Urine analysis:    Component Value Date/Time   COLORURINE YELLOW 05/19/2021 2015   APPEARANCEUR HAZY (A) 05/19/2021 2015   LABSPEC 1.012 05/19/2021 2015   PHURINE 9.0 (H) 05/19/2021 2015   GLUCOSEU NEGATIVE 05/19/2021 2015   HGBUR NEGATIVE 05/19/2021 2015   [place] NEGATIVE 05/19/2021 2015   [place] 05/19/2021 2015   PROTEINUR >=300 (A) 05/19/2021 2015   NITRITE NEGATIVE 05/19/2021 2015   LEUKOCYTESUR SMALL (A) 05/19/2021 2015    Sepsis Labs: Lactic Acid, Venous    Component Value Date/Time   LATICACIDVEN 3.4 ([place]) 04/07/2022 0913    MICROBIOLOGY: Recent Results (from the past 240 hour(s))  MRSA Next Gen by PCR, Nasal     Status: None   Collection Time: 07/27/22  8:50 PM   Specimen: Nasal Mucosa; Nasal Swab  Result Value Ref Range  Status   MRSA by PCR Next Gen NOT DETECTED NOT DETECTED Final    Comment: (NOTE) The GeneXpert MRSA Assay (FDA approved for NASAL specimens only), is one component of a comprehensive MRSA colonization surveillance program. It is not intended to diagnose MRSA infection nor to guide or monitor treatment for MRSA infections. Test performance is not FDA approved in patients less than 34 years old. Performed at [facility], [place] [address], [place], [place] [address]     RADIOLOGY STUDIES/RESULTS: No results found.   LOS: 3 days   Oren Binet, MD  [facility]    To contact the attending provider between 7A-7P or the covering provider during after hours 7P-7A, please log into the web site [email or web address] and access using universal [place] password for that web site. If you do not have the password, please call the hospital operator.  07/30/2022, 10:34 AM

## 2022-07-30 NOTE — Progress Notes (Signed)
Received patient in bed to unit.  Alert and oriented.  Informed consent signed and in chart.   Treatment initiated: 2039 Treatment completed: 0033  Patient tolerated well.  Transported back to the room  Alert, without acute distress.  Hand-off given to patient's nurse.   Access used: [place] Access issues: none  Total UF removed: 3810 Medication(s) given: Hectorol Post HD VS: 98, [id number]), HR-66, RR-16, SP02-97 Post HD weight: 76.5kg   [facility]

## 2022-07-30 NOTE — Plan of Care (Signed)

## 2022-07-30 NOTE — Progress Notes (Signed)
Subjective: Asleep in room, awoken, no complaints except late HD last night till 12 midnight, said tolerated 3810 ml  UF, denies melena or hematochezia  Objective Vital signs in last 24 hours: Vitals:   07/30/22 0111 07/30/22 0317 07/30/22 0633 07/30/22 0922  BP: (!) 168/83 (!) 149/88 (!) 150/97 (!) 184/86  Pulse: 73 70    Resp: 18 10    Temp: 97.8 F (36.6 C) 97.8 F (36.6 C)    TempSrc: Oral Axillary    SpO2:  95%    Weight:      Height:       Weight change: 3.189 kg  Physical Exam: General: Awake and then alert and in NAD Heart: RRR no MRG Lungs: Breath sounds bilaterally nonlabored breathing Abdomen: NABS, soft NTND Extremities: No pedal edema  dialysis Access: Positive bruit L UA aVF   Home meds include - albuterol, coreg 3.125 bid, diltiazem 360 qd, gabapentin 100 tts, hydralazine 25 qid, losartan 25 qd, singulair, renavit, oxycodone prn, pantoprazole, sevelamer carb 2 ac tid, prns/ vits/ supps    [facility]  3h 66mn  450/ 1.5  71kg  2/2 bath  P4  Hep none   LUA AVF - last HD 10/10, post wt 73.1kg - last Hb 5.0 on 10/10 - mircera 225 q2, last 10/5 - iron sucrose '50mg'$  q wk, last 10/5 - hectorol 2 ug IV tiw tts   Problem/Plan: Anemia/recent GI bleed with acute blood loss anemia= consulting GI , EGD 10/13= stigmata of recent bleed , for MRCP , 3 units packed red blood cells so far Hgb 4. 7 to 7.4 this a.m. no heparin HD   ESRD -HD TTS schedule Hypertension/volume = CXR with edema 10/12 , HD UF tolerated with no drop in blood pressure 3.4 L UF, HD 10/12/ 3.8 uf 10/14 hd   Anemia of ESRD/GI bleed-for ESA 10/19(give 200 Aranesp.)  Weekly Venofer on HD-Hgb trend as above-  Secondary hyperparathyroidism - C Ca in range, phosphorus 5.5 <7.1, on binders when taking meals A-fib paroxysmal-regular on my exam today , meds per admit, no  AC with GI bleed on diltiazem rate control HO Colon cancer status post right hemicolectomy 2014  DErnest Haber PA-C [facility] [phone]/15/2023,11:03 AM  LOS: 3 days   Labs: Basic Metabolic Panel: Recent Labs  Lab 07/28/22 1531 07/29/22 1154 07/30/22 0938  NA 137 132* 131*  K 4.6 4.9 3.6  CL 97* 93* 90*  CO2 24 17* 27  GLUCOSE 89 82 92  BUN 75* 89* 51*  CREATININE 8.81* 10.62* 6.76*  CALCIUM 7.4* 7.2* 7.5*  PHOS 7.1* 7.8* 5.5*   Liver Function Tests: Recent Labs  Lab 07/27/22 0409 07/28/22 1531 07/29/22 1154 07/30/22 0938  AST 25  --   --   --   ALT 17  --   --   --   ALKPHOS 104  --   --   --   BILITOT 0.6  --   --   --   PROT 6.9  --   --   --   ALBUMIN 2.8* 2.9* 2.8* 2.8*   No results for input(s): "LIPASE", "AMYLASE" in the last 168 hours. No results for input(s): "AMMONIA" in the last 168 hours. CBC: Recent Labs  Lab 07/27/22 0409 07/27/22 2127 07/28/22 1531 07/29/22 1154 07/30/22 0938  WBC 7.0  --  6.6 6.7 6.0  NEUTROABS 5.4  --   --   --   --   HGB  4.7*   < > 7.7* 7.3* 7.4*  HCT 15.8*   < > 22.7* 22.1* 22.7*  MCV 101.3*  --  89.0 89.5 90.4  PLT 319  --  295 261 277   < > = values in this interval not displayed.   Cardiac Enzymes: No results for input(s): "CKTOTAL", "CKMB", "CKMBINDEX", "TROPONINI" in the last 168 hours. CBG: No results for input(s): "GLUCAP" in the last 168 hours.  Studies/Results: No results found. Medications:  [START ON 08/01/2022] ferric gluconate (FERRLECIT) IVPB      sodium chloride   Intravenous Once   carvedilol  3.125 mg Oral STAT   carvedilol  6.25 mg Oral BID WC   Chlorhexidine Gluconate Cloth  6 each Topical Q0600   Chlorhexidine Gluconate Cloth  6 each Topical Q0600   [START ON 08/03/2022] darbepoetin (ARANESP) injection - DIALYSIS  200 mcg Intravenous Q Thu-HD   diltiazem  360 mg Oral Daily   doxercalciferol  2 mcg Intravenous Q T,Th,Sa-HD   fluticasone  2 spray Each Nare Daily   gabapentin  100 mg Oral Q T,Th,Sat-1800   hydrALAZINE  25 mg Oral Q6H   lidocaine-prilocaine   Topical Q T,Th,Sa-HD   losartan  25  mg Oral Daily   montelukast  10 mg Oral QHS   multivitamin  1 tablet Oral QHS   pantoprazole  40 mg Intravenous Q12H   sevelamer carbonate  1,600 mg Oral TID WC

## 2022-07-31 ENCOUNTER — Encounter (HOSPITAL_COMMUNITY): Payer: Self-pay | Admitting: Family Medicine

## 2022-07-31 ENCOUNTER — Inpatient Hospital Stay (HOSPITAL_COMMUNITY): Payer: Medicare Other | Admitting: Anesthesiology

## 2022-07-31 ENCOUNTER — Encounter (HOSPITAL_COMMUNITY)
Admission: EM | Disposition: A | Discharge status: Home / Self Care | Payer: Self-pay | Source: Ambulatory Visit | Attending: Internal Medicine

## 2022-07-31 DIAGNOSIS — K3189 Other diseases of stomach and duodenum: Secondary | ICD-10-CM

## 2022-07-31 DIAGNOSIS — I509 Heart failure, unspecified: Secondary | ICD-10-CM

## 2022-07-31 DIAGNOSIS — Z87891 Personal history of nicotine dependence: Secondary | ICD-10-CM

## 2022-07-31 DIAGNOSIS — Z992 Dependence on renal dialysis: Secondary | ICD-10-CM

## 2022-07-31 DIAGNOSIS — K2970 Gastritis, unspecified, without bleeding: Secondary | ICD-10-CM

## 2022-07-31 DIAGNOSIS — D631 Anemia in chronic kidney disease: Secondary | ICD-10-CM

## 2022-07-31 DIAGNOSIS — I132 Hypertensive heart and chronic kidney disease with heart failure and with stage 5 chronic kidney disease, or end stage renal disease: Secondary | ICD-10-CM

## 2022-07-31 HISTORY — PX: ESOPHAGOGASTRODUODENOSCOPY (EGD) WITH PROPOFOL: SHX5813

## 2022-07-31 LAB — CBC
HCT: 20.5 % — ABNORMAL LOW (ref 39.0–52.0)
Hemoglobin: 6.8 g/dL — CL (ref 13.0–17.0)
MCH: 29.8 pg (ref 26.0–34.0)
MCHC: 33.2 g/dL (ref 30.0–36.0)
MCV: 89.9 fL (ref 80.0–100.0)
Platelets: 257 10*3/uL (ref 150–400)
RBC: 2.28 MIL/uL — ABNORMAL LOW (ref 4.22–5.81)
RDW: 18.2 % — ABNORMAL HIGH (ref 11.5–15.5)
WBC: 6 10*3/uL (ref 4.0–10.5)
nRBC: 0 % (ref 0.0–0.2)

## 2022-07-31 LAB — PREPARE RBC (CROSSMATCH)

## 2022-07-31 LAB — HEMOGLOBIN AND HEMATOCRIT, BLOOD
HCT: 22.7 % — ABNORMAL LOW (ref 39.0–52.0)
Hemoglobin: 7.6 g/dL — ABNORMAL LOW (ref 13.0–17.0)

## 2022-07-31 LAB — HEPATITIS B SURFACE ANTIBODY, QUANTITATIVE: Hep B S AB Quant (Post): 20.8 m[IU]/mL (ref >9.9)

## 2022-07-31 SURGERY — ESOPHAGOGASTRODUODENOSCOPY (EGD) WITH PROPOFOL
Anesthesia: Monitor Anesthesia Care

## 2022-07-31 MED ORDER — PROPOFOL 10 MG/ML IV BOLUS
INTRAVENOUS | Status: DC | PRN
  Administered 2022-07-31: 20 mg via INTRAVENOUS

## 2022-07-31 MED ORDER — GLUCAGON HCL RDNA (DIAGNOSTIC) 1 MG IJ SOLR
INTRAMUSCULAR | Status: AC
  Filled 2022-07-31: qty 1

## 2022-07-31 MED ORDER — GLYCOPYRROLATE 0.2 MG/ML IJ SOLN
INTRAMUSCULAR | Status: DC | PRN
  Administered 2022-07-31: .2 mg via INTRAVENOUS

## 2022-07-31 MED ORDER — PROPOFOL 500 MG/50ML IV EMUL
INTRAVENOUS | Status: DC | PRN
  Administered 2022-07-31: 125 ug/kg/min via INTRAVENOUS

## 2022-07-31 MED ORDER — DIPHENHYDRAMINE HCL 25 MG PO CAPS
25.0000 mg | ORAL_CAPSULE | Freq: Once | ORAL | Status: AC
  Administered 2022-07-31: 25 mg via ORAL
  Filled 2022-07-31: qty 1

## 2022-07-31 MED ORDER — ACETAMINOPHEN 325 MG PO TABS
650.0000 mg | ORAL_TABLET | Freq: Once | ORAL | Status: AC
  Administered 2022-07-31: 650 mg via ORAL
  Filled 2022-07-31: qty 2

## 2022-07-31 MED ORDER — SODIUM CHLORIDE 0.9 % IV SOLN: INTRAVENOUS | Status: DC

## 2022-07-31 MED ORDER — GLUCAGON HCL RDNA (DIAGNOSTIC) 1 MG IJ SOLR
INTRAMUSCULAR | Status: DC | PRN
  Administered 2022-07-31: .25 mg via INTRAVENOUS

## 2022-07-31 MED ORDER — DEXMEDETOMIDINE HCL IN NACL 80 MCG/20ML IV SOLN
INTRAVENOUS | Status: DC | PRN
  Administered 2022-07-31: 4 ug via BUCCAL

## 2022-07-31 MED ORDER — SODIUM CHLORIDE 0.9 % IV SOLN
250.0000 mg | Freq: Every day | INTRAVENOUS | Status: DC
  Administered 2022-07-31 – 2022-08-02 (×3): 250 mg via INTRAVENOUS
  Filled 2022-07-31 (×5): qty 20

## 2022-07-31 MED ORDER — CHLORHEXIDINE GLUCONATE CLOTH 2 % EX PADS: 6.0000 | MEDICATED_PAD | Freq: Every day | CUTANEOUS | Status: DC

## 2022-07-31 MED ORDER — SODIUM CHLORIDE 0.9% IV SOLUTION: Freq: Once | INTRAVENOUS | Status: DC

## 2022-07-31 MED ORDER — LIDOCAINE 2% (20 MG/ML) 5 ML SYRINGE
INTRAMUSCULAR | Status: DC | PRN
  Administered 2022-07-31: 50 mg via INTRAVENOUS

## 2022-07-31 MED ORDER — DEXMEDETOMIDINE (PRECEDEX) IN NS 20 MCG/5ML (4 MCG/ML) IV SYRINGE
PREFILLED_SYRINGE | INTRAVENOUS | Status: DC | PRN
  Administered 2022-07-31: 4 ug via INTRAVENOUS

## 2022-07-31 MED ORDER — DILTIAZEM HCL ER COATED BEADS 180 MG PO CP24
360.0000 mg | ORAL_CAPSULE | Freq: Every day | ORAL | Status: DC
  Administered 2022-07-31 – 2022-08-02 (×3): 360 mg via ORAL
  Filled 2022-07-31 (×3): qty 2

## 2022-07-31 SURGICAL SUPPLY — 15 items

## 2022-07-31 NOTE — Progress Notes (Addendum)
[facility] Progress Note   Subjective:   Patient seen and examined in room.  Sitting in bedside chair getting 1 unit pRBC.  Denies CP, SOB, abdominal pain and n/v/d.  No exact site of bleeding identified on EGD.  GI consulting IR.   Objective Vitals:   07/31/22 1100 07/31/22 1115 07/31/22 1130 07/31/22 1148  BP: 117/69 117/67 (!) 138/94 (!) 156/92  Pulse: 69 71 66 67  Resp: (!) '24 17 15 18  '$ Temp: (!) 97.1 F (36.2 C)  (!) 97.1 F (36.2 C)   TempSrc: Temporal     SpO2: 98% 96% 97% 92%  Weight:      Height:       Physical Exam General:chronically ill appearing male in NAD, +dry skin Heart:RRR, no mrg Lungs:CTAB, nml WOB on RA Abdomen:soft, NTND Extremities:trace LE edema Dialysis Access: LU AVF +b/t   Filed Weights   07/29/22 2030 07/30/22 0053 07/31/22 0950  Weight: 80.3 kg 76.5 kg 76.5 kg    Intake/Output Summary (Last 24 hours) at 07/31/2022 1225 Last data filed at 07/31/2022 1053 Gross per 24 hour  Intake 250 ml  Output --  Net 250 ml    Additional Objective Labs: Basic Metabolic Panel: Recent Labs  Lab 07/28/22 1531 07/29/22 1154 07/30/22 0938  NA 137 132* 131*  K 4.6 4.9 3.6  CL 97* 93* 90*  CO2 24 17* 27  GLUCOSE 89 82 92  BUN 75* 89* 51*  CREATININE 8.81* 10.62* 6.76*  CALCIUM 7.4* 7.2* 7.5*  PHOS 7.1* 7.8* 5.5*   Liver Function Tests: Recent Labs  Lab 07/27/22 0409 07/28/22 1531 07/29/22 1154 07/30/22 0938  AST 25  --   --   --   ALT 17  --   --   --   ALKPHOS 104  --   --   --   BILITOT 0.6  --   --   --   PROT 6.9  --   --   --   ALBUMIN 2.8* 2.9* 2.8* 2.8*   CBC: Recent Labs  Lab 07/27/22 0409 07/27/22 2127 07/28/22 1531 07/29/22 1154 07/30/22 0938 07/31/22 0839  WBC 7.0  --  6.6 6.7 6.0 6.0  NEUTROABS 5.4  --   --   --   --   --   HGB 4.7*   < > 7.7* 7.3* 7.4* 6.8*  HCT 15.8*   < > 22.7* 22.1* 22.7* 20.5*  MCV 101.3*  --  89.0 89.5 90.4 89.9  PLT 319  --  295 261 277 257   < > = values in this interval  not displayed.   Iron Studies:  Recent Labs    07/29/22 1154  IRON 44*  TIBC 356  FERRITIN 84   Lab Results  Component Value Date   INR 1.1 07/03/2022   INR 1.3 (H) 04/06/2022   INR 1.4 (H) 02/13/2022   Studies/Results: MR ABDOMEN MRCP WO CONTRAST  Result Date: 07/30/2022 CLINICAL DATA:  Bleeding from ampulla. EXAM: MRI ABDOMEN WITHOUT CONTRAST  (INCLUDING MRCP) TECHNIQUE: Multiplanar multisequence MR imaging of the abdomen was performed. Heavily T2-weighted images of the biliary and pancreatic ducts were obtained, and three-dimensional MRCP images were rendered by post processing. COMPARISON:  CTA 07/05/2022 FINDINGS: Lower chest: The heart is enlarged. Hepatobiliary: Decreased signal intensity within the liver parenchyma on T2 imaging suggests iron deposition disease. No focal abnormality seen in the liver parenchyma on noncontrast imaging. 2 cm gallstone evident with smaller stone seen in the neck of  the gallbladder. No intra or extrahepatic biliary duct dilatation. Ampullary region is obscured by beam hardening artifact from embolization coils. Pancreas: Posterior pancreatic head is obscured. Mild main duct prominence although assessment is limited by lack of intravenous contrast material and motion artifact. Spleen: 1.9 cm T2 hyperintense lesion in the dome of the spleen is stable since CT of 06/17/2021 suggesting benign etiology but cannot be definitively characterized. Adrenals/Urinary Tract: No adrenal nodule or mass. Numerous cysts are noted in both kidneys. 3.4 cm mass in the right kidney cannot be definitively characterized on noncontrast imaging but was described as a complex cyst on recent CTA. Stomach/Bowel: Stomach is unremarkable. No gastric wall thickening. No evidence of outlet obstruction. Duodenum is normally positioned as is the ligament of Treitz. No small bowel or colonic dilatation within the visualized abdomen. Vascular/Lymphatic: No abdominal aortic aneurysm. There is no  gastrohepatic or hepatoduodenal ligament lymphadenopathy. No retroperitoneal or mesenteric lymphadenopathy. Other: Intraperitoneal free fluid with diffuse mesenteric edema evident. Musculoskeletal: No overtly suspicious marrow signal abnormality. IMPRESSION: 1. Ampullary region is obscured by beam hardening artifact from embolization coils. Posterior pancreatic head is obscured. Mild main duct prominence noted in the pancreas although assessment is limited by lack of intravenous contrast material and motion artifact. 2. Cholelithiasis. 3. Intraperitoneal free fluid with diffuse mesenteric edema evident. 4. 1.9 cm T2 hyperintense lesion in the dome of the spleen is stable since CT of 06/17/2021 suggesting benign etiology although it cannot be definitively characterized on noncontrast imaging. 5. Numerous cysts in both kidneys. 3.4 cm mass in the right kidney cannot be definitively characterized on noncontrast imaging but was described as a complex cyst on recent CTA. Electronically Signed   By: Misty Stanley M.D.   On: 07/30/2022 13:48    Medications:  ferric gluconate (FERRLECIT) IVPB      sodium chloride   Intravenous Once   sodium chloride   Intravenous Once   acetaminophen  650 mg Oral Once   carvedilol  6.25 mg Oral BID WC   Chlorhexidine Gluconate Cloth  6 each Topical Q0600   Chlorhexidine Gluconate Cloth  6 each Topical Q0600   [START ON 08/03/2022] darbepoetin (ARANESP) injection - DIALYSIS  200 mcg Intravenous Q Thu-HD   diltiazem  360 mg Oral Daily   diphenhydrAMINE  25 mg Oral Once   doxercalciferol  2 mcg Intravenous Q T,Th,Sa-HD   fluticasone  2 spray Each Nare Daily   gabapentin  100 mg Oral Q T,Th,Sat-1800   hydrALAZINE  25 mg Oral Q6H   lidocaine-prilocaine   Topical Q T,Th,Sa-HD   losartan  25 mg Oral Daily   montelukast  10 mg Oral QHS   multivitamin  1 tablet Oral QHS   pantoprazole  40 mg Intravenous Q12H   sevelamer carbonate  1,600 mg Oral TID WC    Dialysis  Orders: [facility]  3h 30mn  450/ 1.5  71kg  2/2 bath  P4  Hep none   LUA AVF - last HD 10/10, post wt 73.1kg - last Hb 5.0 on 10/10 - mircera 225 q2, last 10/5 - iron sucrose '50mg'$  q wk, last 10/5 - hectorol 2 ug IV tiw tts   Problem/Plan: Recurrent GI bleed - sm bowel enteroscopy with clotted blood near clips.  MRCP unrevealing d/t obscured d/t embolization coils.  Repeat EGD this AM w/mild gastritis, duodenal bulb coils w/small erosions, small amt blood in bulb but no exact site of bleeding identified. IR consult d/t possible intermittent oozing from site of coil  embolization.  ES[facility] -HD TTS schedule. Orders for HD tomorrow per regular schedule.  Hypertension/volume - CXR with likely edema 10/12 , tolerating 3.4-3.8L UF goal w/HD.  Plan for HD tomorrow with max UF as tolerated. Per weights not close to EDW.  ABLA on Anemia of CKD - Hgb 4.3 on admit, s/p 3 units pRBC, getting additional 1 unit today d/t Hgb drop 6.8 this AM.  ESA 10/19(give 200 Aranesp.)  Tsat 12%, order iron load with HD.  Secondary hyperparathyroidism - C Ca in range, phos improved. Continue binders when taking meals A-fib paroxysmal-regular on my exam today.  Anticoagulation on hold w/GIB.  HO Colon cancer status post right hemicolectomy 2014 Nutrition - Renal diet w/fluid restrictions.   Jen Mow, PA-C [facility] 07/31/2022,12:25 PM  LOS: 4 days

## 2022-07-31 NOTE — Anesthesia Preprocedure Evaluation (Addendum)
Anesthesia Evaluation  Patient identified by MRN, date of birth, ID band Patient awake    Reviewed: Allergy & Precautions, NPO status , Patient's Chart, lab work & pertinent test results  Airway Mallampati: II  TM Distance: >3 FB Neck ROM: Full    Dental  (+) Missing   Pulmonary asthma , Patient abstained from smoking., former smoker   Pulmonary exam normal        Cardiovascular hypertension, Pt. on home beta blockers and Pt. on medications +CHF  Normal cardiovascular exam     Neuro/Psych  Headaches  negative psych ROS   GI/Hepatic negative GI ROS,,,(+)     substance abuse  , Hepatitis -, C  Endo/Other  negative endocrine ROS    Renal/GU ESRF and DialysisRenal disease     Musculoskeletal negative musculoskeletal ROS (+)    Abdominal   Peds  Hematology  (+) Blood dyscrasia, anemia   Anesthesia Other Findings  Anemia Upper GI bleed  Reproductive/Obstetrics                             Anesthesia Physical Anesthesia Plan  ASA: 4  Anesthesia Plan: MAC   Post-op Pain Management:    Induction: Intravenous  PONV Risk Score and Plan: 1 and Propofol infusion and Treatment may vary due to age or medical condition  Airway Management Planned: Nasal Cannula  Additional Equipment:   Intra-op Plan:   Post-operative Plan:   Informed Consent: I have reviewed the patients History and Physical, chart, labs and discussed the procedure including the risks, benefits and alternatives for the proposed anesthesia with the patient or authorized representative who has indicated his/her understanding and acceptance.     Dental advisory given  Plan Discussed with: CRNA  Anesthesia Plan Comments:        Anesthesia Quick Evaluation

## 2022-07-31 NOTE — Care Management Important Message (Signed)
Important Message  Patient Details  Name: Timothy [name] MRN: [id number] Date of Birth: 1961-01-02   Medicare Important Message Given:  Yes     Alanie Syler 07/31/2022, 3:55 PM

## 2022-07-31 NOTE — Progress Notes (Signed)
Request received for possible UGIB embolization.   Patient is s/p coli embo of [place] on 11/05/21, and repeat Gel-foam embo of anterior and posterior pancreaticoduodenal arcades on 11/16/21.   Case reviewed with Dr. Laurence Ferrari, patient is not a candidate for repeat UGIB embo by IR as there is no other vessels for embolization.   Attending and GI MD notified.  Will delete the IR rad eval order.   Please call IR for questions and concerns.   Irvin Bastin H Lada Fulbright PA-C 07/31/2022 1:32 PM

## 2022-07-31 NOTE — Transfer of Care (Signed)
Immediate Anesthesia Transfer of Care Note  Patient: Timothy [name]  Procedure(s) Performed: ESOPHAGOGASTRODUODENOSCOPY (EGD) WITH PROPOFOL  Patient Location: PACU  Anesthesia Type:MAC  Level of Consciousness: awake, alert  and oriented  Airway & Oxygen Therapy: Patient Spontanous Breathing and Patient connected to face mask oxygen  Post-op Assessment: Report given to RN and Post -op Vital signs reviewed and stable  Post vital signs: Reviewed and stable  Last Vitals:  Vitals Value Taken Time  BP 117/69 07/31/22 1100  Temp    Pulse 70 07/31/22 1102  Resp 21 07/31/22 1102  SpO2 93 % 07/31/22 1102  Vitals shown include unvalidated device data.  Last Pain:  Vitals:   07/31/22 0950  TempSrc: Temporal  PainSc: 0-No pain         Complications: No notable events documented.

## 2022-07-31 NOTE — Progress Notes (Signed)
Pt refused AM labs and 0400 VS; pt also reminded of upcoming procedure this morning and that he has orders not to eat or drink anything. Pt verbalized understanding but continues to eat ice

## 2022-07-31 NOTE — Anesthesia Postprocedure Evaluation (Signed)
Anesthesia Post Note  Patient: Timothy [name]  Procedure(s) Performed: ESOPHAGOGASTRODUODENOSCOPY (EGD) WITH PROPOFOL     Patient location during evaluation: PACU Anesthesia Type: MAC Level of consciousness: awake Pain management: pain level controlled Vital Signs Assessment: post-procedure vital signs reviewed and stable Respiratory status: spontaneous breathing, nonlabored ventilation, respiratory function stable and patient connected to nasal cannula oxygen Cardiovascular status: stable and blood pressure returned to baseline Postop Assessment: no apparent nausea or vomiting Anesthetic complications: no   No notable events documented.  Last Vitals:  Vitals:   07/31/22 1148 07/31/22 1330  BP: (!) 156/92 119/79  Pulse: 67 61  Resp: 18 18  Temp: 36.7 C   SpO2: 92% 99%    Last Pain:  Vitals:   07/31/22 1148  TempSrc: Oral  PainSc:                  Latrica Clowers P Adessa Primiano

## 2022-07-31 NOTE — Interval H&P Note (Signed)
History and Physical Interval Note:  07/31/2022 10:05 AM  Timothy [name]  has presented today for surgery, with the diagnosis of Anemia, upper GI bleed.  The various methods of treatment have been discussed with the patient and family. After consideration of risks, benefits and other options for treatment, the patient has consented to  Procedure(s): ESOPHAGOGASTRODUODENOSCOPY (EGD) WITH PROPOFOL (N/A) as a surgical intervention.  The patient's history has been reviewed, patient examined, no change in status, stable for surgery.  I have reviewed the patient's chart and labs.  Questions were answered to the patient's satisfaction.     Pricilla Riffle. Fuller Plan

## 2022-07-31 NOTE — Progress Notes (Signed)
PROGRESS NOTE        PATIENT DETAILS Name: Timothy [name] Age: 61 y.o. Sex: male Date of Birth: 1960-11-30 Admit Date: 07/26/2022 Admitting Physician Christel Mormon, MD WLN:[id number], Iona Beard, MD  Brief Summary: Patient is a 61 y.o.  male with history of ESRD on HD TTS, history of duodenal ulcer requiring IR embolization on 11/16/2021, recurrent GI bleeding requiring numerous hospitalizations and multiple endoscopic evaluations-just discharged from 9/27-referred to the ED from HD clinic due to severe anemia with a hemoglobin of 4.7.  Per patient-since his most recent discharge on 9/27-his stools have been black.  Significant events: 9/18-9/27>> hospitalization for presumed GI bleeding-CTA showed extravasation at splenic flexure, however EGD/colonoscopy were negative.  Required 3 units of PRBC. 10/11>> referred to the ED from HD clinic for hemoglobin of 4.7.  Brown stools per patient.  Significant studies: 10/12>> CXR: No obvious PNA. 10/15>> MRCP: Ampullary region obscured by artifact from embolization coils  Significant microbiology data: None  Procedures: 10/13>> EGD: Blood in the second portion of duodenum near previously placed clips  Consults: GI Renal  Subjective: Claims stools were green in color earlier this morning.  Had refused blood work this morning but after I spoke to him he finally relented and agreed.  Objective: Vitals: Blood pressure (!) 145/88, pulse 63, temperature (!) 97.1 F (36.2 C), temperature source Temporal, resp. rate (!) 9, height '6\' 1"'$  (1.854 m), weight 76.5 kg, SpO2 98 %.   Exam: Gen Exam:Alert awake-not in any distress HEENT:atraumatic, normocephalic Chest: B/L clear to auscultation anteriorly CVS:S1S2 regular Abdomen:soft non tender, non distended Extremities:no edema Neurology: Non focal Skin: no rash   Pertinent Labs/Radiology:    Latest Ref Rng & Units 07/31/2022    8:39 AM 07/30/2022    9:38 AM 07/29/2022    11:54 AM  CBC  WBC 4.0 - 10.5 K/uL 6.0  6.0  6.7   Hemoglobin 13.0 - 17.0 g/dL 6.8  7.4  7.3   Hematocrit 39.0 - 52.0 % 20.5  22.7  22.1   Platelets 150 - 400 K/uL 257  277  261     Lab Results  Component Value Date   NA 131 (L) 07/30/2022   K 3.6 07/30/2022   CL 90 (L) 07/30/2022   CO2 27 07/30/2022      Assessment/Plan: Severe anemia Recent GI bleeding with acute blood loss anemia EGD on 10/13 with stigmata of recent bleeding in the second portion of duodenum Concerned that patient may have a periampullary lesion-however MRCP nondiagnostic due to artery from prior embolization study. Hemoglobin down to 6.8 this morning-we will go and transfuse 1 additional unit of PRBC (4th unit so far).  Patient is a very poor historian-unclear if green stools that he visualized earlier this morning was actually melena. GI planning repeat EGD with side-viewing duodenoscope to better visualize the periampullary area Follow CBC Continue PPI Await further recommendations from GI  ESRD on HD TTS Nephrology following Iron/EPO defer to nephrology service  Anemia Multifactorial-from acute blood loss-superimposed on anemia of ESRD Follow CBC  Chronic HFpEF Euvolemic Volume removal with HD.  PAF Maintaining sinus rhythm Continue Coreg/diltiazem Not a candidate for anticoagulation given recurrent GI bleeding Note-refused telemetry per nursing staff  HTN BP on the higher side-increase Coreg to 6.25 twice daily Continue diltiazem/losartan. Follow/optimize  History of thoracic aortic aneurysm Continue outpatient surveillance  History of  colon cancer-s/p right hemi-colectomy 2014 Colonoscopy x2 this year negative for recurrence  History of HCV Treated-undetectable September 2022  BMI: Estimated body mass index is 22.25 kg/m as calculated from the following:   Height as of this encounter: '6\' 1"'$  (1.854 m).   Weight as of this encounter: 76.5 kg.   Code status:   Code Status:  Full Code   DVT Prophylaxis: SCDs Start: 07/27/22 0524   Family Communication:  None at bedside   Disposition Plan: Status is: Inpatient Remains inpatient appropriate because: Recurrent GI bleeding with acute blood loss anemia-awaiting MRI abdomen   Planned Discharge Destination: Home when GI work-up is completed.   Diet: Diet Order             Diet NPO time specified Except for: Sips with Meds  Diet effective midnight                     Antimicrobial agents: Anti-infectives (From admission, onward)    None        MEDICATIONS: Scheduled Meds:  [MAR Hold] sodium chloride   Intravenous Once   sodium chloride   Intravenous Once   acetaminophen  650 mg Oral Once   [MAR Hold] carvedilol  6.25 mg Oral BID WC   [MAR Hold] Chlorhexidine Gluconate Cloth  6 each Topical Q0600   [MAR Hold] Chlorhexidine Gluconate Cloth  6 each Topical Q0600   [MAR Hold] darbepoetin (ARANESP) injection - DIALYSIS  200 mcg Intravenous Q Thu-HD   [MAR Hold] diltiazem  360 mg Oral Daily   diphenhydrAMINE  25 mg Oral Once   [MAR Hold] doxercalciferol  2 mcg Intravenous Q T,Th,Sa-HD   [MAR Hold] fluticasone  2 spray Each Nare Daily   [MAR Hold] gabapentin  100 mg Oral Q T,Th,Sat-1800   [MAR Hold] hydrALAZINE  25 mg Oral Q6H   [MAR Hold] lidocaine-prilocaine   Topical Q T,Th,Sa-HD   [MAR Hold] losartan  25 mg Oral Daily   [MAR Hold] montelukast  10 mg Oral QHS   [MAR Hold] multivitamin  1 tablet Oral QHS   [MAR Hold] pantoprazole  40 mg Intravenous Q12H   [MAR Hold] sevelamer carbonate  1,600 mg Oral TID WC   Continuous Infusions:  sodium chloride     [MAR Hold] ferric gluconate (FERRLECIT) IVPB     PRN Meds:.[MAR Hold] acetaminophen **OR** [MAR Hold] acetaminophen, [MAR Hold] albuterol, [MAR Hold] hydrALAZINE, [MAR Hold] magnesium hydroxide, [MAR Hold] ondansetron **OR** [MAR Hold] ondansetron (ZOFRAN) IV, [MAR Hold] oxyCODONE, [MAR Hold] sodium chloride, [MAR Hold] traZODone   I  have personally reviewed following labs and imaging studies  LABORATORY DATA: CBC: Recent Labs  Lab 07/27/22 0409 07/27/22 2127 07/28/22 1200 07/28/22 1531 07/29/22 1154 07/30/22 0938 07/31/22 0839  WBC 7.0  --   --  6.6 6.7 6.0 6.0  NEUTROABS 5.4  --   --   --   --   --   --   HGB 4.7*   < > 8.8* 7.7* 7.3* 7.4* 6.8*  HCT 15.8*   < > 26.0* 22.7* 22.1* 22.7* 20.5*  MCV 101.3*  --   --  89.0 89.5 90.4 89.9  PLT 319  --   --  295 261 277 257   < > = values in this interval not displayed.     Basic Metabolic Panel: Recent Labs  Lab 07/27/22 0409 07/28/22 1200 07/28/22 1531 07/29/22 1154 07/30/22 0938  NA 144 135 137 132* 131*  K 4.1 4.6 4.6  4.9 3.6  CL 97* 96* 97* 93* 90*  CO2 29  --  24 17* 27  GLUCOSE 93 90 89 82 92  BUN 70* 75* 75* 89* 51*  CREATININE 8.28* 9.50* 8.81* 10.62* 6.76*  CALCIUM 7.8*  --  7.4* 7.2* 7.5*  PHOS  --   --  7.1* 7.8* 5.5*     GFR: Estimated Creatinine Clearance: 12.4 mL/min (A) (by C-G formula based on SCr of 6.76 mg/dL (H)).  Liver Function Tests: Recent Labs  Lab 07/27/22 0409 07/28/22 1531 07/29/22 1154 07/30/22 0938  AST 25  --   --   --   ALT 17  --   --   --   ALKPHOS 104  --   --   --   BILITOT 0.6  --   --   --   PROT 6.9  --   --   --   ALBUMIN 2.8* 2.9* 2.8* 2.8*    No results for input(s): "LIPASE", "AMYLASE" in the last 168 hours. No results for input(s): "AMMONIA" in the last 168 hours.  Coagulation Profile: No results for input(s): "INR", "PROTIME" in the last 168 hours.  Cardiac Enzymes: No results for input(s): "CKTOTAL", "CKMB", "CKMBINDEX", "TROPONINI" in the last 168 hours.  BNP (last 3 results) No results for input(s): "PROBNP" in the last 8760 hours.  Lipid Profile: No results for input(s): "CHOL", "HDL", "LDLCALC", "TRIG", "CHOLHDL", "LDLDIRECT" in the last 72 hours.  Thyroid Function Tests: No results for input(s): "TSH", "T4TOTAL", "FREET4", "T3FREE", "THYROIDAB" in the last 72 hours.  Anemia  Panel: Recent Labs    07/29/22 1154  FOLATE 20.0  FERRITIN 84  TIBC 356  IRON 44*     Urine analysis:    Component Value Date/Time   COLORURINE YELLOW 05/19/2021 2015   APPEARANCEUR HAZY (A) 05/19/2021 2015   LABSPEC 1.012 05/19/2021 2015   PHURINE 9.0 (H) 05/19/2021 2015   GLUCOSEU NEGATIVE 05/19/2021 2015   HGBUR NEGATIVE 05/19/2021 2015   [place] NEGATIVE 05/19/2021 2015   [place] 05/19/2021 2015   PROTEINUR >=300 (A) 05/19/2021 2015   NITRITE NEGATIVE 05/19/2021 2015   LEUKOCYTESUR SMALL (A) 05/19/2021 2015    Sepsis Labs: Lactic Acid, Venous    Component Value Date/Time   LATICACIDVEN 3.4 ([place]) 04/07/2022 0913    MICROBIOLOGY: Recent Results (from the past 240 hour(s))  MRSA Next Gen by PCR, Nasal     Status: None   Collection Time: 07/27/22  8:50 PM   Specimen: Nasal Mucosa; Nasal Swab  Result Value Ref Range Status   MRSA by PCR Next Gen NOT DETECTED NOT DETECTED Final    Comment: (NOTE) The GeneXpert MRSA Assay (FDA approved for NASAL specimens only), is one component of a comprehensive MRSA colonization surveillance program. It is not intended to diagnose MRSA infection nor to guide or monitor treatment for MRSA infections. Test performance is not FDA approved in patients less than 49 years old. Performed at [facility], [place] [address], [place], [place] [address]     RADIOLOGY STUDIES/RESULTS: MR ABDOMEN MRCP WO CONTRAST  Result Date: 07/30/2022 CLINICAL DATA:  Bleeding from ampulla. EXAM: MRI ABDOMEN WITHOUT CONTRAST  (INCLUDING MRCP) TECHNIQUE: Multiplanar multisequence MR imaging of the abdomen was performed. Heavily T2-weighted images of the biliary and pancreatic ducts were obtained, and three-dimensional MRCP images were rendered by post processing. COMPARISON:  CTA 07/05/2022 FINDINGS: Lower chest: The heart is enlarged. Hepatobiliary: Decreased signal intensity within the liver parenchyma on T2 imaging suggests  iron  deposition disease. No focal abnormality seen in the liver parenchyma on noncontrast imaging. 2 cm gallstone evident with smaller stone seen in the neck of the gallbladder. No intra or extrahepatic biliary duct dilatation. Ampullary region is obscured by beam hardening artifact from embolization coils. Pancreas: Posterior pancreatic head is obscured. Mild main duct prominence although assessment is limited by lack of intravenous contrast material and motion artifact. Spleen: 1.9 cm T2 hyperintense lesion in the dome of the spleen is stable since CT of 06/17/2021 suggesting benign etiology but cannot be definitively characterized. Adrenals/Urinary Tract: No adrenal nodule or mass. Numerous cysts are noted in both kidneys. 3.4 cm mass in the right kidney cannot be definitively characterized on noncontrast imaging but was described as a complex cyst on recent CTA. Stomach/Bowel: Stomach is unremarkable. No gastric wall thickening. No evidence of outlet obstruction. Duodenum is normally positioned as is the ligament of Treitz. No small bowel or colonic dilatation within the visualized abdomen. Vascular/Lymphatic: No abdominal aortic aneurysm. There is no gastrohepatic or hepatoduodenal ligament lymphadenopathy. No retroperitoneal or mesenteric lymphadenopathy. Other: Intraperitoneal free fluid with diffuse mesenteric edema evident. Musculoskeletal: No overtly suspicious marrow signal abnormality. IMPRESSION: 1. Ampullary region is obscured by beam hardening artifact from embolization coils. Posterior pancreatic head is obscured. Mild main duct prominence noted in the pancreas although assessment is limited by lack of intravenous contrast material and motion artifact. 2. Cholelithiasis. 3. Intraperitoneal free fluid with diffuse mesenteric edema evident. 4. 1.9 cm T2 hyperintense lesion in the dome of the spleen is stable since CT of 06/17/2021 suggesting benign etiology although it cannot be definitively characterized  on noncontrast imaging. 5. Numerous cysts in both kidneys. 3.4 cm mass in the right kidney cannot be definitively characterized on noncontrast imaging but was described as a complex cyst on recent CTA. Electronically Signed   By: Misty Stanley M.D.   On: 07/30/2022 13:48     LOS: 4 days   Oren Binet, MD  [facility]    To contact the attending provider between 7A-7P or the covering provider during after hours 7P-7A, please log into the web site [email or web address] and access using universal [place] password for that web site. If you do not have the password, please call the hospital operator.  07/31/2022, 10:14 AM

## 2022-07-31 NOTE — Progress Notes (Signed)
Transition of Care Department [facility]) following patient for high risk of readmission.   Patient admitted for GI bleeding. Patient history of  ESRD on HD TTS, history of duodenal ulcer requiring IR embolization on 11/16/2021, recurrent GI bleeding requiring numerous hospitalizations and multiple endoscopic evaluations-just discharged from 9/27-referred to the ED from HD clinic due to severe anemia with a hemoglobin of 4.7. Plan for EGD today.    Transition of Care Department [facility]) has reviewed patient and we will continue to monitor patient advancement through interdisciplinary progression rounds.

## 2022-07-31 NOTE — Op Note (Addendum)
[facility] Patient Name: Timothy [name] Procedure Date : 07/31/2022 MRN: [id number] Attending MD: Ladene Artist , MD Date of Birth: October 26, 1960 CSN: [id number] Age: 61 Admit Type: Inpatient Procedure:                Upper GI endoscopy Indications:              Upper gastrointestinal bleeding, acute on chronic                            anemia, occult blood in stool Providers:                Pricilla Riffle. Fuller Plan, MD, Dulcy Fanny, William Dalton, Technician Referring MD:             [facility] Medicines:                Monitored Anesthesia Care Complications:            No immediate complications. Estimated Blood Loss:     Estimated blood loss was minimal. Procedure:                Pre-Anesthesia Assessment:                           - Prior to the procedure, a History and Physical                            was performed, and patient medications and                            allergies were reviewed. The patient's tolerance of                            previous anesthesia was also reviewed. The risks                            and benefits of the procedure and the sedation                            options and risks were discussed with the patient.                            All questions were answered, and informed consent                            was obtained. Prior Anticoagulants: The patient has                            taken no previous anticoagulant or antiplatelet                            agents. ASA Grade Assessment: III - A patient with  severe systemic disease. After reviewing the risks                            and benefits, the patient was deemed in                            satisfactory condition to undergo the procedure.                           After obtaining informed consent, the endoscope was                            passed under direct vision. Throughout the                             procedure, the patient's blood pressure, pulse, and                            oxygen saturations were monitored continuously. The                            [id number]-[id number] ([id number]) Olympus endoscope was introduced                            through the mouth, and advanced to the third part                            of duodenum. Then the [id number] ([id number]) Olympus                            duodenoscope was introduced through the mouth and                            advanced to the second portion of the duodenum. The                            upper GI endoscopy was accomplished without                            difficulty. The patient tolerated the procedure                            well. Scope In: Scope Out: Findings:      The examined esophagus was normal.      Localized mild inflammation characterized by erythema was found in the       gastric antrum.      The exam of the stomach was otherwise normal.      Coils from prior embolization with small associated erosions were found       in the duodenal bulb.      A blush of red blood was found in the bulb and second portion of the       duodenum that cleared promptly with lavage and did not recur. The exact       site of bleeding was not  determined.      Single clip in the second duodenum adjacent to and immediately to the       left of the papilla. Normal golden bile draining from the papilla was       noted. The exam of the duodenum was otherwise normal. Impression:               - Normal esophagus.                           - Mild focal gastritis.                           - Duodenal bulb coils with small associated                            erosions.                           - Small amount of blood in the bulb and second                            portion of the duodenum that cleared promptly with                            lavage and did not recur. The exact site of                            bleeding was not determined.                            - Single clip in the second duodenum adjacent to                            and immediately left of the papilla.                           - No specimens collected. Recommendation:           - Return patient to hospital ward for ongoing care.                           - Resume previous diet today.                           - Continue present medications including                            pantoprazole bid.                           - IR consult, possible intermittent oozing from                            site of coil embolization Procedure Code(s):        --- Professional ---  58850, Esophagogastroduodenoscopy, flexible,                            transoral; diagnostic, including collection of                            specimen(s) by brushing or washing, when performed                            (separate procedure) Diagnosis Code(s):        --- Professional ---                           K29.70, Gastritis, unspecified, without bleeding                           K31.89, Other diseases of stomach and duodenum                           K92.2, Gastrointestinal hemorrhage, unspecified CPT copyright 2019 [employer]. All rights reserved. The codes documented in this report are preliminary and upon coder review may  be revised to meet current compliance requirements. Ladene Artist, MD 07/31/2022 11:25:18 AM This report has been signed electronically. Number of Addenda: 0

## 2022-08-01 ENCOUNTER — Encounter (HOSPITAL_COMMUNITY): Payer: Self-pay | Admitting: Internal Medicine

## 2022-08-01 LAB — CBC
HCT: 23 % — ABNORMAL LOW (ref 39.0–52.0)
HCT: 22.6 % — ABNORMAL LOW (ref 39.0–52.0)
Hemoglobin: 7.7 g/dL — ABNORMAL LOW (ref 13.0–17.0)
Hemoglobin: 7.3 g/dL — ABNORMAL LOW (ref 13.0–17.0)
MCH: 29.5 pg (ref 26.0–34.0)
MCH: 29.2 pg (ref 26.0–34.0)
MCHC: 33.5 g/dL (ref 30.0–36.0)
MCHC: 32.3 g/dL (ref 30.0–36.0)
MCV: 88.1 fL (ref 80.0–100.0)
MCV: 90.4 fL (ref 80.0–100.0)
Platelets: 265 10*3/uL (ref 150–400)
Platelets: 255 10*3/uL (ref 150–400)
RBC: 2.61 MIL/uL — ABNORMAL LOW (ref 4.22–5.81)
RBC: 2.5 MIL/uL — ABNORMAL LOW (ref 4.22–5.81)
RDW: 17.3 % — ABNORMAL HIGH (ref 11.5–15.5)
RDW: 17.5 % — ABNORMAL HIGH (ref 11.5–15.5)
WBC: 5.6 10*3/uL (ref 4.0–10.5)
WBC: 6.3 10*3/uL (ref 4.0–10.5)
nRBC: 0 % (ref 0.0–0.2)
nRBC: 0 % (ref 0.0–0.2)

## 2022-08-01 LAB — RENAL FUNCTION PANEL
Albumin: 2.6 g/dL — ABNORMAL LOW (ref 3.5–5.0)
Anion gap: 18 — ABNORMAL HIGH (ref 5–15)
BUN: 71 mg/dL — ABNORMAL HIGH (ref 8–23)
CO2: 22 mmol/L (ref 22–32)
Calcium: 7.6 mg/dL — ABNORMAL LOW (ref 8.9–10.3)
Chloride: 84 mmol/L — ABNORMAL LOW (ref 98–111)
Creatinine, Ser: 9.32 mg/dL — ABNORMAL HIGH (ref 0.61–1.24)
GFR, Estimated: 6 mL/min — ABNORMAL LOW (ref >60)
Glucose, Bld: 89 mg/dL (ref 70–99)
Phosphorus: 7.3 mg/dL — ABNORMAL HIGH (ref 2.5–4.6)
Potassium: 4.5 mmol/L (ref 3.5–5.1)
Sodium: 124 mmol/L — ABNORMAL LOW (ref 135–145)

## 2022-08-01 LAB — BPAM RBC
Blood Product Expiration Date: 202310312359
ISSUE DATE / TIME: 202310161109
Unit Type and Rh: 7300

## 2022-08-01 LAB — TYPE AND SCREEN
ABO/RH(D): B POS
Antibody Screen: NEGATIVE
Unit division: 0

## 2022-08-01 MED ORDER — DIPHENHYDRAMINE HCL 25 MG PO CAPS
25.0000 mg | ORAL_CAPSULE | Freq: Four times a day (QID) | ORAL | Status: DC | PRN
  Administered 2022-08-01 – 2022-08-02 (×2): 25 mg via ORAL
  Filled 2022-08-01 (×2): qty 1

## 2022-08-01 MED ORDER — PENTAFLUOROPROP-TETRAFLUOROETH EX AERO: 1.0000 | INHALATION_SPRAY | CUTANEOUS | Status: DC | PRN

## 2022-08-01 MED ORDER — PANTOPRAZOLE SODIUM 40 MG PO TBEC
40.0000 mg | DELAYED_RELEASE_TABLET | Freq: Two times a day (BID) | ORAL | Status: DC
  Administered 2022-08-01 – 2022-08-02 (×2): 40 mg via ORAL
  Filled 2022-08-01 (×2): qty 1

## 2022-08-01 MED ORDER — LIDOCAINE-PRILOCAINE 2.5-2.5 % EX CREA: 1.0000 | TOPICAL_CREAM | CUTANEOUS | Status: DC | PRN

## 2022-08-01 MED ORDER — LIDOCAINE HCL (PF) 1 % IJ SOLN: 5.0000 mL | INTRAMUSCULAR | Status: DC | PRN

## 2022-08-01 MED ORDER — PENTAFLUOROPROP-TETRAFLUOROETH EX AERO
INHALATION_SPRAY | CUTANEOUS | Status: AC
  Filled 2022-08-01: qty 30

## 2022-08-01 NOTE — Progress Notes (Signed)
[facility] Progress Note   Subjective:   Seen in HD -  hgb drifted down some it seems after transfusion yesterday -  he denies abdominal pain or any obvious bleeding   Objective Vitals:   08/01/22 0923 08/01/22 0930 08/01/22 1000 08/01/22 1003  BP: (!) 162/88 (!) 162/88 (!) 164/90 (!) 164/90  Pulse: 64 67 60 64  Resp: '11 12 15 13  '$ Temp:      TempSrc:      SpO2:  98% 94%   Weight:      Height:       Physical Exam General:chronically ill appearing male in NAD,  Heart:RRR, no mrg Lungs:CTAB, nml WOB on RA Abdomen:soft, NTND Extremities:trace LE edema Dialysis Access: LU AVF +b/t   Filed Weights   07/30/22 0053 07/31/22 0950 08/01/22 0821  Weight: 76.5 kg 76.5 kg 85.4 kg    Intake/Output Summary (Last 24 hours) at 08/01/2022 1013 Last data filed at 08/01/2022 0500 Gross per 24 hour  Intake 2210 ml  Output --  Net 2210 ml    Additional Objective Labs: Basic Metabolic Panel: Recent Labs  Lab 07/29/22 1154 07/30/22 0938 08/01/22 0830  NA 132* 131* 124*  K 4.9 3.6 4.5  CL 93* 90* 84*  CO2 17* 27 22  GLUCOSE 82 92 89  BUN 89* 51* 71*  CREATININE 10.62* 6.76* 9.32*  CALCIUM 7.2* 7.5* 7.6*  PHOS 7.8* 5.5* 7.3*   Liver Function Tests: Recent Labs  Lab 07/27/22 0409 07/28/22 1531 07/29/22 1154 07/30/22 0938 08/01/22 0830  AST 25  --   --   --   --   ALT 17  --   --   --   --   ALKPHOS 104  --   --   --   --   BILITOT 0.6  --   --   --   --   PROT 6.9  --   --   --   --   ALBUMIN 2.8*   < > 2.8* 2.8* 2.6*   < > = values in this interval not displayed.   CBC: Recent Labs  Lab 07/27/22 0409 07/27/22 2127 07/29/22 1154 07/30/22 0938 07/31/22 0839 07/31/22 1517 08/01/22 0714 08/01/22 0830  WBC 7.0   < > 6.7 6.0 6.0  --  5.6 6.3  NEUTROABS 5.4  --   --   --   --   --   --   --   HGB 4.7*   < > 7.3* 7.4* 6.8* 7.6* 7.7* 7.3*  HCT 15.8*   < > 22.1* 22.7* 20.5* 22.7* 23.0* 22.6*  MCV 101.3*   < > 89.5 90.4 89.9  --  88.1 90.4  PLT 319    < > 261 277 257  --  265 255   < > = values in this interval not displayed.   Iron Studies:  Recent Labs    07/29/22 1154  IRON 44*  TIBC 356  FERRITIN 84   Lab Results  Component Value Date   INR 1.1 07/03/2022   INR 1.3 (H) 04/06/2022   INR 1.4 (H) 02/13/2022   Studies/Results: MR ABDOMEN MRCP WO CONTRAST  Result Date: 07/30/2022 CLINICAL DATA:  Bleeding from ampulla. EXAM: MRI ABDOMEN WITHOUT CONTRAST  (INCLUDING MRCP) TECHNIQUE: Multiplanar multisequence MR imaging of the abdomen was performed. Heavily T2-weighted images of the biliary and pancreatic ducts were obtained, and three-dimensional MRCP images were rendered by post processing. COMPARISON:  CTA 07/05/2022 FINDINGS: Lower chest: The  heart is enlarged. Hepatobiliary: Decreased signal intensity within the liver parenchyma on T2 imaging suggests iron deposition disease. No focal abnormality seen in the liver parenchyma on noncontrast imaging. 2 cm gallstone evident with smaller stone seen in the neck of the gallbladder. No intra or extrahepatic biliary duct dilatation. Ampullary region is obscured by beam hardening artifact from embolization coils. Pancreas: Posterior pancreatic head is obscured. Mild main duct prominence although assessment is limited by lack of intravenous contrast material and motion artifact. Spleen: 1.9 cm T2 hyperintense lesion in the dome of the spleen is stable since CT of 06/17/2021 suggesting benign etiology but cannot be definitively characterized. Adrenals/Urinary Tract: No adrenal nodule or mass. Numerous cysts are noted in both kidneys. 3.4 cm mass in the right kidney cannot be definitively characterized on noncontrast imaging but was described as a complex cyst on recent CTA. Stomach/Bowel: Stomach is unremarkable. No gastric wall thickening. No evidence of outlet obstruction. Duodenum is normally positioned as is the ligament of Treitz. No small bowel or colonic dilatation within the visualized  abdomen. Vascular/Lymphatic: No abdominal aortic aneurysm. There is no gastrohepatic or hepatoduodenal ligament lymphadenopathy. No retroperitoneal or mesenteric lymphadenopathy. Other: Intraperitoneal free fluid with diffuse mesenteric edema evident. Musculoskeletal: No overtly suspicious marrow signal abnormality. IMPRESSION: 1. Ampullary region is obscured by beam hardening artifact from embolization coils. Posterior pancreatic head is obscured. Mild main duct prominence noted in the pancreas although assessment is limited by lack of intravenous contrast material and motion artifact. 2. Cholelithiasis. 3. Intraperitoneal free fluid with diffuse mesenteric edema evident. 4. 1.9 cm T2 hyperintense lesion in the dome of the spleen is stable since CT of 06/17/2021 suggesting benign etiology although it cannot be definitively characterized on noncontrast imaging. 5. Numerous cysts in both kidneys. 3.4 cm mass in the right kidney cannot be definitively characterized on noncontrast imaging but was described as a complex cyst on recent CTA. Electronically Signed   By: Misty Stanley M.D.   On: 07/30/2022 13:48    Medications:  ferric gluconate (FERRLECIT) IVPB 250 mg (07/31/22 1407)    sodium chloride   Intravenous Once   sodium chloride   Intravenous Once   carvedilol  6.25 mg Oral BID WC   Chlorhexidine Gluconate Cloth  6 each Topical Q0600   [START ON 08/03/2022] darbepoetin (ARANESP) injection - DIALYSIS  200 mcg Intravenous Q Thu-HD   diltiazem  360 mg Oral Daily   doxercalciferol  2 mcg Intravenous Q T,Th,Sa-HD   fluticasone  2 spray Each Nare Daily   gabapentin  100 mg Oral Q T,Th,Sat-1800   hydrALAZINE  25 mg Oral Q6H   lidocaine-prilocaine   Topical Q T,Th,Sa-HD   losartan  25 mg Oral Daily   montelukast  10 mg Oral QHS   multivitamin  1 tablet Oral QHS   pantoprazole  40 mg Intravenous Q12H   pentafluoroprop-tetrafluoroeth       sevelamer carbonate  1,600 mg Oral TID WC    Dialysis  Orders: [facility]  3h 41mn  450/ 1.5  71kg  2/2 bath  P4  Hep none   LUA AVF - last HD 10/10, post wt 73.1kg - last Hb 5.0 on 10/10 - mircera 225 q2, last 10/5 - iron sucrose '50mg'$  q wk, last 10/5 - hectorol 2 ug IV tiw tts   Problem/Plan: Recurrent GI bleed - sm bowel enteroscopy with clotted blood near clips.  MRCP unrevealing d/t obscured d/t embolization coils.  Repeat EGD this AM w/mild gastritis, duodenal bulb coils  w/small erosions, small amt blood in bulb but no exact site of bleeding identified. IR consult d/t possible intermittent oozing from site of coil embolization. Unclear if any further intervention is planned ESRD -HD TTS schedule. Orders for HD today per regular schedule.  Hypertension/volume - CXR with likely edema 10/12 , tolerating 3.4-3.8L UF goal w/HD.  Plan for HD with max UF as tolerated. Per weights not close to EDW.  ABLA on Anemia of CKD - Hgb 4.3 on admit, s/p 3 units pRBC, getting additional 1 unit today d/t Hgb drop 6.8 this AM.  ESA 10/19(give 200 Aranesp.)  Tsat 12%, order iron load as well  Secondary hyperparathyroidism - C Ca in range, phos improved. Continue renvela when taking meals A-fib paroxysmal-regular on my exam today.  Anticoagulation on hold w/GIB.  HO Colon cancer status post right hemicolectomy 2014 Nutrition - Renal diet w/fluid restrictions.   [facility] 08/01/2022,10:13 AM  LOS: 5 days

## 2022-08-01 NOTE — Progress Notes (Signed)
PROGRESS NOTE        PATIENT DETAILS Name: Timothy [name] Age: 61 y.o. Sex: male Date of Birth: 1961-04-11 Admit Date: 07/26/2022 Admitting Physician Christel Mormon, MD [id number], Iona Beard, MD  Brief Summary: Patient is a 61 y.o.  male with history of ESRD on HD TTS, history of duodenal ulcer requiring IR embolization on 11/16/2021, recurrent GI bleeding requiring numerous hospitalizations and multiple endoscopic evaluations-just discharged from 9/27-referred to the ED from HD clinic due to severe anemia with a hemoglobin of 4.7.  Per patient-since his most recent discharge on 9/27-his stools have been black.  Significant events: 9/18-9/27>> hospitalization for presumed GI bleeding-CTA showed extravasation at splenic flexure, however EGD/colonoscopy were negative.  Required 3 units of PRBC. 10/11>> referred to the ED from HD clinic for hemoglobin of 4.7.  Brown stools per patient.  Significant studies: 10/12>> CXR: No obvious PNA. 10/15>> MRCP: Ampullary region obscured by artifact from embolization coils  Significant microbiology data: None  Procedures: 10/13>> enteroscopy: Blood in the second portion of duodenum near previously placed clips 10/16>> EGD: Small amount of blood in the duodenal bulb-second portion-exact site of bleeding was not determined.  Consults: GI Renal  Subjective: Sleeping comfortably-no major issues overnight.  Had 1 small green-colored BM yesterday-none since then.  Objective: Vitals: Blood pressure (!) 148/88, pulse 71, temperature 97.8 F (36.6 C), temperature source Oral, resp. rate 13, height '6\' 1"'$  (1.854 m), weight 85.4 kg, SpO2 92 %.   Exam: Gen Exam:Alert awake-not in any distress HEENT:atraumatic, normocephalic Chest: B/L clear to auscultation anteriorly CVS:S1S2 regular Abdomen:soft non tender, non distended Extremities:no edema Neurology: Non focal Skin: no rash   Pertinent Labs/Radiology:    Latest Ref  Rng & Units 08/01/2022    8:30 AM 08/01/2022    7:14 AM 07/31/2022    3:17 PM  CBC  WBC 4.0 - 10.5 K/uL 6.3  5.6    Hemoglobin 13.0 - 17.0 g/dL 7.3  7.7  7.6   Hematocrit 39.0 - 52.0 % 22.6  23.0  22.7   Platelets 150 - 400 K/uL 255  265      Lab Results  Component Value Date   NA 124 (L) 08/01/2022   K 4.5 08/01/2022   CL 84 (L) 08/01/2022   CO2 22 08/01/2022      Assessment/Plan: Severe anemia Recent GI bleeding with acute blood loss anemia Endoscopy evaluation x2 this admission shows bleeding in the second portion of the duodenum at site of prior coil embolization.  Per IR-no benefit from further intervention from their point of view.  Unclear if any further endoscopy will provide effective hemostasis.  Thankfully this appears to be a very slow bleed. Has required 4 units of PRBC so far-last transfused on 10/16.   Continue PPI  Follow CBC  Await further recommendations from GI   ESRD on HD TTS Nephrology following Iron/EPO defer to nephrology service  Anemia Multifactorial-from acute blood loss-superimposed on anemia of ESRD Follow CBC  Hyponatremia Likely due to inability to clear of free water-should improve with HD. Recheck electrolytes tomorrow.  Chronic HFpEF Euvolemic Volume removal with HD.  PAF Maintaining sinus rhythm Continue Coreg/diltiazem Not a candidate for anticoagulation given recurrent GI bleeding Note-refused telemetry per nursing staff  HTN BP on the higher side-increase Coreg to 6.25 twice daily Continue diltiazem/losartan. Follow/optimize  History of thoracic aortic aneurysm Continue outpatient  surveillance  History of colon cancer-s/p right hemi-colectomy 2014 Colonoscopy x2 this year negative for recurrence  History of HCV Treated-undetectable September 2022  BMI: Estimated body mass index is 24.84 kg/m as calculated from the following:   Height as of this encounter: '6\' 1"'$  (1.854 m).   Weight as of this encounter: 85.4 kg.    Code status:   Code Status: Full Code   DVT Prophylaxis: SCDs Start: 07/27/22 0524   Family Communication:  None at bedside   Disposition Plan: Status is: Inpatient Remains inpatient appropriate because: Recurrent GI bleeding with acute blood loss anemia-awaiting MRI abdomen   Planned Discharge Destination: Home when GI work-up is completed.   Diet: Diet Order             Diet renal with fluid restriction Fluid restriction: 1200 mL Fluid; Room service appropriate? Yes; Fluid consistency: Thin  Diet effective now                     Antimicrobial agents: Anti-infectives (From admission, onward)    None        MEDICATIONS: Scheduled Meds:  sodium chloride   Intravenous Once   sodium chloride   Intravenous Once   carvedilol  6.25 mg Oral BID WC   Chlorhexidine Gluconate Cloth  6 each Topical Q0600   [START ON 08/03/2022] darbepoetin (ARANESP) injection - DIALYSIS  200 mcg Intravenous Q Thu-HD   diltiazem  360 mg Oral Daily   doxercalciferol  2 mcg Intravenous Q T,Th,Sa-HD   fluticasone  2 spray Each Nare Daily   gabapentin  100 mg Oral Q T,Th,Sat-1800   hydrALAZINE  25 mg Oral Q6H   lidocaine-prilocaine   Topical Q T,Th,Sa-HD   losartan  25 mg Oral Daily   montelukast  10 mg Oral QHS   multivitamin  1 tablet Oral QHS   pantoprazole  40 mg Intravenous Q12H   pentafluoroprop-tetrafluoroeth       sevelamer carbonate  1,600 mg Oral TID WC   Continuous Infusions:  ferric gluconate (FERRLECIT) IVPB 250 mg (07/31/22 1407)   PRN Meds:.acetaminophen **OR** acetaminophen, albuterol, hydrALAZINE, lidocaine (PF), lidocaine-prilocaine, magnesium hydroxide, ondansetron **OR** ondansetron (ZOFRAN) IV, oxyCODONE, pentafluoroprop-tetrafluoroeth, pentafluoroprop-tetrafluoroeth, sodium chloride, traZODone   I have personally reviewed following labs and imaging studies  LABORATORY DATA: CBC: Recent Labs  Lab 07/27/22 0409 07/27/22 2127 07/29/22 1154  07/30/22 0938 07/31/22 0839 07/31/22 1517 08/01/22 0714 08/01/22 0830  WBC 7.0   < > 6.7 6.0 6.0  --  5.6 6.3  NEUTROABS 5.4  --   --   --   --   --   --   --   HGB 4.7*   < > 7.3* 7.4* 6.8* 7.6* 7.7* 7.3*  HCT 15.8*   < > 22.1* 22.7* 20.5* 22.7* 23.0* 22.6*  MCV 101.3*   < > 89.5 90.4 89.9  --  88.1 90.4  PLT 319   < > 261 277 257  --  265 255   < > = values in this interval not displayed.     Basic Metabolic Panel: Recent Labs  Lab 07/27/22 0409 07/28/22 1200 07/28/22 1531 07/29/22 1154 07/30/22 0938 08/01/22 0830  NA 144 135 137 132* 131* 124*  K 4.1 4.6 4.6 4.9 3.6 4.5  CL 97* 96* 97* 93* 90* 84*  CO2 29  --  24 17* 27 22  GLUCOSE 93 90 89 82 92 89  BUN 70* 75* 75* 89* 51* 71*  CREATININE 8.28* 9.50*  8.81* 10.62* 6.76* 9.32*  CALCIUM 7.8*  --  7.4* 7.2* 7.5* 7.6*  PHOS  --   --  7.1* 7.8* 5.5* 7.3*     GFR: Estimated Creatinine Clearance: 9.4 mL/min (A) (by C-G formula based on SCr of 9.32 mg/dL (H)).  Liver Function Tests: Recent Labs  Lab 07/27/22 0409 07/28/22 1531 07/29/22 1154 07/30/22 0938 08/01/22 0830  AST 25  --   --   --   --   ALT 17  --   --   --   --   ALKPHOS 104  --   --   --   --   BILITOT 0.6  --   --   --   --   PROT 6.9  --   --   --   --   ALBUMIN 2.8* 2.9* 2.8* 2.8* 2.6*    No results for input(s): "LIPASE", "AMYLASE" in the last 168 hours. No results for input(s): "AMMONIA" in the last 168 hours.  Coagulation Profile: No results for input(s): "INR", "PROTIME" in the last 168 hours.  Cardiac Enzymes: No results for input(s): "CKTOTAL", "CKMB", "CKMBINDEX", "TROPONINI" in the last 168 hours.  BNP (last 3 results) No results for input(s): "PROBNP" in the last 8760 hours.  Lipid Profile: No results for input(s): "CHOL", "HDL", "LDLCALC", "TRIG", "CHOLHDL", "LDLDIRECT" in the last 72 hours.  Thyroid Function Tests: No results for input(s): "TSH", "T4TOTAL", "FREET4", "T3FREE", "THYROIDAB" in the last 72 hours.  Anemia  Panel: Recent Labs    07/29/22 1154  FOLATE 20.0  FERRITIN 84  TIBC 356  IRON 44*     Urine analysis:    Component Value Date/Time   COLORURINE YELLOW 05/19/2021 2015   APPEARANCEUR HAZY (A) 05/19/2021 2015   LABSPEC 1.012 05/19/2021 2015   PHURINE 9.0 (H) 05/19/2021 2015   GLUCOSEU NEGATIVE 05/19/2021 2015   HGBUR NEGATIVE 05/19/2021 2015   [place] NEGATIVE 05/19/2021 2015   [place] 05/19/2021 2015   PROTEINUR >=300 (A) 05/19/2021 2015   NITRITE NEGATIVE 05/19/2021 2015   LEUKOCYTESUR SMALL (A) 05/19/2021 2015    Sepsis Labs: Lactic Acid, Venous    Component Value Date/Time   LATICACIDVEN 3.4 ([place]) 04/07/2022 0913    MICROBIOLOGY: Recent Results (from the past 240 hour(s))  MRSA Next Gen by PCR, Nasal     Status: None   Collection Time: 07/27/22  8:50 PM   Specimen: Nasal Mucosa; Nasal Swab  Result Value Ref Range Status   MRSA by PCR Next Gen NOT DETECTED NOT DETECTED Final    Comment: (NOTE) The GeneXpert MRSA Assay (FDA approved for NASAL specimens only), is one component of a comprehensive MRSA colonization surveillance program. It is not intended to diagnose MRSA infection nor to guide or monitor treatment for MRSA infections. Test performance is not FDA approved in patients less than 12 years old. Performed at [facility], [place], [place], [place] [address]     RADIOLOGY STUDIES/RESULTS: MR ABDOMEN MRCP WO CONTRAST  Result Date: 07/30/2022 CLINICAL DATA:  Bleeding from ampulla. EXAM: MRI ABDOMEN WITHOUT CONTRAST  (INCLUDING MRCP) TECHNIQUE: Multiplanar multisequence MR imaging of the abdomen was performed. Heavily T2-weighted images of the biliary and pancreatic ducts were obtained, and three-dimensional MRCP images were rendered by post processing. COMPARISON:  CTA 07/05/2022 FINDINGS: Lower chest: The heart is enlarged. Hepatobiliary: Decreased signal intensity within the liver parenchyma on T2 imaging suggests iron  deposition disease. No focal abnormality seen in the liver parenchyma on noncontrast imaging.  2 cm gallstone evident with smaller stone seen in the neck of the gallbladder. No intra or extrahepatic biliary duct dilatation. Ampullary region is obscured by beam hardening artifact from embolization coils. Pancreas: Posterior pancreatic head is obscured. Mild main duct prominence although assessment is limited by lack of intravenous contrast material and motion artifact. Spleen: 1.9 cm T2 hyperintense lesion in the dome of the spleen is stable since CT of 06/17/2021 suggesting benign etiology but cannot be definitively characterized. Adrenals/Urinary Tract: No adrenal nodule or mass. Numerous cysts are noted in both kidneys. 3.4 cm mass in the right kidney cannot be definitively characterized on noncontrast imaging but was described as a complex cyst on recent CTA. Stomach/Bowel: Stomach is unremarkable. No gastric wall thickening. No evidence of outlet obstruction. Duodenum is normally positioned as is the ligament of Treitz. No small bowel or colonic dilatation within the visualized abdomen. Vascular/Lymphatic: No abdominal aortic aneurysm. There is no gastrohepatic or hepatoduodenal ligament lymphadenopathy. No retroperitoneal or mesenteric lymphadenopathy. Other: Intraperitoneal free fluid with diffuse mesenteric edema evident. Musculoskeletal: No overtly suspicious marrow signal abnormality. IMPRESSION: 1. Ampullary region is obscured by beam hardening artifact from embolization coils. Posterior pancreatic head is obscured. Mild main duct prominence noted in the pancreas although assessment is limited by lack of intravenous contrast material and motion artifact. 2. Cholelithiasis. 3. Intraperitoneal free fluid with diffuse mesenteric edema evident. 4. 1.9 cm T2 hyperintense lesion in the dome of the spleen is stable since CT of 06/17/2021 suggesting benign etiology although it cannot be definitively characterized  on noncontrast imaging. 5. Numerous cysts in both kidneys. 3.4 cm mass in the right kidney cannot be definitively characterized on noncontrast imaging but was described as a complex cyst on recent CTA. Electronically Signed   By: Misty Stanley M.D.   On: 07/30/2022 13:48     LOS: 5 days   Oren Binet, MD  [facility]    To contact the attending provider between 7A-7P or the covering provider during after hours 7P-7A, please log into the web site [email or web address] and access using universal [place] password for that web site. If you do not have the password, please call the hospital operator.  08/01/2022, 10:51 AM

## 2022-08-01 NOTE — Progress Notes (Addendum)
Daily Rounding Note  08/01/2022, 1:43 PM  LOS: 5 days   SUBJECTIVE:   Chief complaint:  upper GI bleeding    His last bowel movement was yesterday in the afternoon and it was brown.  He feels well.  Tolerating solid food.  Asking when he can go home.  No dizziness, no shortness of breath  OBJECTIVE:         Vital signs in last 24 hours:    Temp:  [97.4 F (36.3 C)-98.2 F (36.8 C)] 97.9 F (36.6 C) (10/17 1244) Pulse Rate:  [46-86] 86 (10/17 1244) Resp:  [11-18] 18 (10/17 1244) BP: (126-167)/(82-132) 151/98 (10/17 1244) SpO2:  [81 %-100 %] 98 % (10/17 1244) Weight:  [80 kg-85.4 kg] 80 kg (10/17 1244) Last BM Date : 07/30/22 Filed Weights   07/31/22 0950 08/01/22 0821 08/01/22 1244  Weight: 76.5 kg 85.4 kg 80 kg   General: Looks chronically ill and older than stated age but comfortable and alert. Heart: RRR Chest: Labored breathing. Abdomen: Tender.  Soft.  No distention.  Active bowel sounds. Extremities: CCE. Neuro/Psych: Pleasant, calm, cooperative.  Fully oriented.  Intake/Output from previous day: 10/16 0701 - 10/17 0700 In: 2210 [P.O.:1420; I.V.:250; Blood:270; IV Piggyback:270] Out: -   Intake/Output this shift: Total I/O In: -  Out: 3300 [Other:3300]  Lab Results: Recent Labs    07/31/22 0839 07/31/22 1517 08/01/22 0714 08/01/22 0830  WBC 6.0  --  5.6 6.3  HGB 6.8* 7.6* 7.7* 7.3*  HCT 20.5* 22.7* 23.0* 22.6*  PLT 257  --  265 255   BMET Recent Labs    07/30/22 0938 08/01/22 0830  NA 131* 124*  K 3.6 4.5  CL 90* 84*  CO2 27 22  GLUCOSE 92 89  BUN 51* 71*  CREATININE 6.76* 9.32*  CALCIUM 7.5* 7.6*   LFT Recent Labs    07/30/22 0938 08/01/22 0830  ALBUMIN 2.8* 2.6*   PT/INR No results for input(s): "LABPROT", "INR" in the last 72 hours. Hepatitis Panel No results for input(s): "HEPBSAG", "HCVAB", "HEPAIGM", "HEPBIGM" in the last 72 hours.  Studies/Results: No results  found.  Scheduled Meds:  sodium chloride   Intravenous Once   sodium chloride   Intravenous Once   carvedilol  6.25 mg Oral BID WC   Chlorhexidine Gluconate Cloth  6 each Topical Q0600   [START ON 08/03/2022] darbepoetin (ARANESP) injection - DIALYSIS  200 mcg Intravenous Q Thu-HD   diltiazem  360 mg Oral Daily   doxercalciferol  2 mcg Intravenous Q T,Th,Sa-HD   fluticasone  2 spray Each Nare Daily   gabapentin  100 mg Oral Q T,Th,Sat-1800   hydrALAZINE  25 mg Oral Q6H   lidocaine-prilocaine   Topical Q T,Th,Sa-HD   losartan  25 mg Oral Daily   montelukast  10 mg Oral QHS   multivitamin  1 tablet Oral QHS   pantoprazole  40 mg Intravenous Q12H   pentafluoroprop-tetrafluoroeth       sevelamer carbonate  1,600 mg Oral TID WC   Continuous Infusions:  ferric gluconate (FERRLECIT) IVPB Stopped (08/01/22 1319)   PRN Meds:.acetaminophen **OR** acetaminophen, albuterol, hydrALAZINE, magnesium hydroxide, ondansetron **OR** ondansetron (ZOFRAN) IV, oxyCODONE, pentafluoroprop-tetrafluoroeth, sodium chloride, traZODone    ASSESMENT:   GI bleed.  FOBT positive. Multiple previous endoscopies for the same. History of GI bleed from duodenal ulcer treated with GDA embolization 10/2021, repeat embolization of anterior/posterior pancreaticoduodenal arcades 11/16/2021 07/31/2022 EGD.  Mild, focal gastritis.  Small erosions associated with coils in the duodenal bulb.  Small volume of blood at the bulb and second duodenum.  Blood cleared but site of bleeding not determined.  Dr. Fuller Plan placed a clip to the left of the major papilla. ? Intermittent oozing at site of embolization?  Dr. Laurence Ferrari reviewed the case and states patient not candidate for repeat embolization as there are no other vessels to embolize.  ABL anemia.  Received 4 PRBCs thus far.  Hgb 6.8.Marland Kitchen. 7.7.. 7.3.  Low iron, low iron sats.  Normal ferritin, TIBC, folate, elevated B12.  ESRD.  Hemodialysis TTS.  Hyponatremia.   PLAN     ?  When safe to discharge?    Switch to Protonix 40 iv bid.    Azucena Freed  08/01/2022, 1:43 PM Phone [phone]    Attending Physician Note   I have taken an interval history, reviewed the chart and examined the patient. I performed a substantive portion of this encounter, including complete performance of at least one of the key components, in conjunction with the APP. I agree with the APP's note, impression and recommendations with my edits. My additional impressions and recommendations are as follows.   UGI bleeding likely from duodenal bulb erosions associated with prior IR coils. Maintain pantoprazole 40 mg bid now and as outpatient. Avoid NSAIDs. Consider lower dose heparin with HD. Hgb stable without evidence of overt GI bleeding. OK for discharge from GI standpoint. Monitor for signs, symptoms of bleeding and monitor CBC with HD.   Lucio Edward, MD [facility] See AMION, [facility], for our on call provider

## 2022-08-01 NOTE — Progress Notes (Addendum)
   08/01/22 1244  Vitals  Temp 97.9 F (36.6 C)  Temp Source Oral  BP (!) 151/98  BP Location Right Arm  BP Method Automatic  Patient Position (if appropriate) Lying  Pulse Rate 86  Pulse Rate Source Monitor  ECG Heart Rate 86  Resp 18  Oxygen Therapy  SpO2 98 %  O2 Device Room Air  Pulse Oximetry Type Continuous   Received patient in bed to unit.  Alert and oriented.  Informed consent signed and in chart.   Treatment initiated: 1287 Treatment completed: 1237  Patient tolerated well.  Transported back to the room  Alert, without acute distress.  Hand-off given to patient's nurse.   Access used: AVF Access issues: NA  Total UF removed: 3300 Medication(s) given: Hectorol '2mg'$  IVP, Ferric Gluconate '250mg'$  IVPB Post HD VS: see above Post HD weight: 80kg   [facility]

## 2022-08-01 NOTE — Procedures (Signed)
Patient was seen on dialysis and the procedure was supervised.  BFR 400  Via AVF BP is  146/88.   Patient appears to be tolerating treatment well  Timothy [name] 08/01/2022

## 2022-08-02 ENCOUNTER — Other Ambulatory Visit (HOSPITAL_COMMUNITY): Payer: Self-pay

## 2022-08-02 ENCOUNTER — Encounter (HOSPITAL_COMMUNITY): Payer: Self-pay | Admitting: Gastroenterology

## 2022-08-02 ENCOUNTER — Inpatient Hospital Stay (HOSPITAL_COMMUNITY): Payer: Medicare Other

## 2022-08-02 LAB — RENAL FUNCTION PANEL
Albumin: 2.7 g/dL — ABNORMAL LOW (ref 3.5–5.0)
Anion gap: 10 (ref 5–15)
BUN: 52 mg/dL — ABNORMAL HIGH (ref 8–23)
CO2: 26 mmol/L (ref 22–32)
Calcium: 7.4 mg/dL — ABNORMAL LOW (ref 8.9–10.3)
Chloride: 88 mmol/L — ABNORMAL LOW (ref 98–111)
Creatinine, Ser: 7.38 mg/dL — ABNORMAL HIGH (ref 0.61–1.24)
GFR, Estimated: 8 mL/min — ABNORMAL LOW (ref >60)
Glucose, Bld: 91 mg/dL (ref 70–99)
Phosphorus: 5.5 mg/dL — ABNORMAL HIGH (ref 2.5–4.6)
Potassium: 4.1 mmol/L (ref 3.5–5.1)
Sodium: 124 mmol/L — ABNORMAL LOW (ref 135–145)

## 2022-08-02 LAB — CBC
HCT: 21.9 % — ABNORMAL LOW (ref 39.0–52.0)
Hemoglobin: 7.1 g/dL — ABNORMAL LOW (ref 13.0–17.0)
MCH: 29.3 pg (ref 26.0–34.0)
MCHC: 32.4 g/dL (ref 30.0–36.0)
MCV: 90.5 fL (ref 80.0–100.0)
Platelets: 236 10*3/uL (ref 150–400)
RBC: 2.42 MIL/uL — ABNORMAL LOW (ref 4.22–5.81)
RDW: 17.1 % — ABNORMAL HIGH (ref 11.5–15.5)
WBC: 6.2 10*3/uL (ref 4.0–10.5)
nRBC: 0 % (ref 0.0–0.2)

## 2022-08-02 MED ORDER — SEVELAMER CARBONATE 800 MG PO TABS
1600.0000 mg | ORAL_TABLET | Freq: Three times a day (TID) | ORAL | 0 refills | Status: AC
  Filled 2022-08-02: qty 180, 30d supply, fill #0

## 2022-08-02 MED ORDER — HYDRALAZINE HCL 50 MG PO TABS
50.0000 mg | ORAL_TABLET | Freq: Two times a day (BID) | ORAL | 0 refills | Status: DC
  Filled 2022-08-02: qty 60, 30d supply, fill #0

## 2022-08-02 MED ORDER — DILTIAZEM HCL ER COATED BEADS 360 MG PO CP24
360.0000 mg | ORAL_CAPSULE | Freq: Every day | ORAL | 0 refills | Status: DC
  Filled 2022-08-02: qty 30, 30d supply, fill #0

## 2022-08-02 MED ORDER — PANTOPRAZOLE SODIUM 40 MG PO TBEC
40.0000 mg | DELAYED_RELEASE_TABLET | Freq: Two times a day (BID) | ORAL | 0 refills | Status: DC
  Filled 2022-08-02: qty 60, 30d supply, fill #0

## 2022-08-02 MED ORDER — CHLORHEXIDINE GLUCONATE CLOTH 2 % EX PADS: 6.0000 | MEDICATED_PAD | Freq: Every day | CUTANEOUS | Status: DC

## 2022-08-02 MED ORDER — CARVEDILOL 6.25 MG PO TABS
6.2500 mg | ORAL_TABLET | Freq: Two times a day (BID) | ORAL | 0 refills | Status: DC
  Filled 2022-08-02: qty 60, 30d supply, fill #0

## 2022-08-02 NOTE — Progress Notes (Signed)
Patient transported off unit by this RN.  Patient transported with personal belongings to include discharge paperwork and TOC medications.  Transported in wheelchair.

## 2022-08-02 NOTE — Hospital Course (Addendum)
Timothy [name] was admitted to the hospital with the working diagnosis of acute blood loss anemia due to to upper GI bleed.   61 yo male with the past medical history of upper GI bleed sp embolization 11/2021, for duodenal ulcer. He also has ESRD on HD, heart failure, colon cancer sp colectomy, hypertension, Hep C and polysubstance abuse who presented with worsening anemia. He was found to have Hgb of 5,5 at his outpatient HD unit, prompting his referral to the ED. On his initial physical examination his blood pressure was 168/101, HR 83, RR 20 and 02 saturation 100%, he has pale, lungs with decreased breath sounds with bibasilar rales, abdomen with no distention or tenderness, no lower extremity edema.   Na 144, K 4,1 CL 97, bicarbonate 29, glucose 93 bun 70 cr 8,28  Wbc 7,0 hgb 4,7 plt 319  Chest radiograph with right rotation, mild cardiomegaly with bilateral hilar vascular congestion and cephalization of the vasculature.   EKG 93 bpm, normal axis, normal intervals, sinus rhythm with no significant ST segment changes, T wave inversion V5 and V6.   Patient had 4 units PRBC transfusion with improvement in Hgb and Hct\  10/13 and 10/16 EGD with small amount of blood in the duodenal bulb-second portion exact site of bleeding was not determined.  Plan to continue pantoprazole Follow up as outpatient and continue cell count monitoring on HD.

## 2022-08-02 NOTE — Progress Notes (Signed)
Patient refused to let NT and RN take his vital signs and HS CBG check. He refused his morning dose of Hydralazine.He also refused for his lab works this morning. MD notified

## 2022-08-02 NOTE — Discharge Summary (Signed)
Physician Discharge Summary   Patient: Timothy [name] MRN: [id number] DOB: September 13, 1961  Admit date:     07/26/2022  Discharge date: 08/02/22  Discharge Physician: [name]   PCP: [name], MD   Recommendations at discharge:    Patient will contin with pantoprazole 40 mg bid Avoid non steroidal anti inflammatory agents Continue to hold on anticoagulation Close monitor cell count as outpatient   Discharge Diagnoses: Principal Problem:   UGI bleed Active Problems:   Acute on chronic combined systolic and diastolic CHF (congestive heart failure) ([place])   ESRD on HD TTS   HTN (hypertension)   Paroxysmal atrial fibrillation ([place])  Resolved Problems:   * No resolved hospital problems. [facility] Hospital Course: [name] was admitted to the hospital with the working diagnosis of acute blood loss anemia due to to upper GI bleed.   61 yo male with the past medical history of upper GI bleed sp embolization 11/2021, for duodenal ulcer. He also has ESRD on HD, heart failure, colon cancer sp colectomy, hypertension, Hep C and polysubstance abuse who presented with worsening anemia. He was found to have Hgb of 5,5 at his outpatient HD unit, prompting his referral to the ED. On his initial physical examination his blood pressure was 168/101, HR 83, RR 20 and 02 saturation 100%, he has pale, lungs with decreased breath sounds with bibasilar rales, abdomen with no distention or tenderness, no lower extremity edema.   Na 144, K 4,1 CL 97, bicarbonate 29, glucose 93 bun 70 cr 8,28  Wbc 7,0 hgb 4,7 plt 319  Chest radiograph with right rotation, mild cardiomegaly with bilateral hilar vascular congestion and cephalization of the vasculature.   EKG 93 bpm, normal axis, normal intervals, sinus rhythm with no significant ST segment changes, T wave inversion V5 and V6.   Patient had 4 units PRBC transfusion with improvement in Hgb and Hct\  10/13 and 10/16 EGD with small amount of  blood in the duodenal bulb-second portion exact site of bleeding was not determined.  Plan to continue pantoprazole Follow up as outpatient and continue cell count monitoring on HD.   Assessment and Plan: * UGI bleed Patient was admitted to the medical ward and received a total of 4 units PRBC with good toleration Placed on proton pump inhibitors   Further work up with EGD 10/13 gastritis, blood in the second portion of the duodenum near previously placed clip, cleared without re-accumulation.  10/16 mild focal gastritis, duodenal bulb with small erosions, small amount of blood in the bulb and second portion of the duodenum that cleared promptly with lavage and did no recur.   Abdominal MRI with acute changes.   IR consulted with recommendations to continue medical therapy, patient not candidate for repeat UGIB embo by IR, as there is no other vessels for embolization.  Patient with no further macroscopic bleeding Follow up Hgb is 7,1  Plan to continue pantoprazole and close follow up cell count on HD.   Acute on chronic combined systolic and diastolic CHF (congestive heart failure) (HCC) Echocardiogram with preserved LV systolic function with EF 50 to 55%, moderate LVH, mild reduced RV systolic function. Mild enlargement in RV size, RVSP 56,7 mmHg. Moderate dilatation LA, moderate to severe TR.   Patient had ultrafiltration with improvement in his symptoms Plant to continue renal replacement therapy as outpatient.   Continue with carvedilol, hydralazine, diltiazem and losartan.    ESRD on HD TTS Continue renal replacement therapy Tuesday, Thursday and Saturday  Chronic hyponatremia.   Follow up Na 124, K 4,1 and serum bicarbonate 26 with BUN 52.   Anemia of chronic renal disease, with iron deficiency. Continue iron supplementation and EPO.   Paroxysmal atrial fibrillation (HCC) Continue rate control with diltiazem and carvedilol Continue to hold on anticoagulation due to  recurrent gastrointestinal bleeding.   HTN (hypertension) Continue blood pressure control with losartan, diltiazem, carvedilol and hydralazine.          Consultants: nephrology, GI, IR  Procedures performed: endoscopy x2   Disposition: Home Diet recommendation:  Cardiac and Carb modified diet DISCHARGE MEDICATION: Allergies as of 08/02/2022   No Known Allergies      Medication List     STOP taking these medications    diltiazem 360 MG 24 hr capsule Commonly known as: TIAZAC   fluticasone 50 MCG/ACT nasal spray Commonly known as: FLONASE   gabapentin 100 MG capsule Commonly known as: NEURONTIN   losartan 25 MG tablet Commonly known as: COZAAR   montelukast 10 MG tablet Commonly known as: SINGULAIR   multivitamin Tabs tablet   oxyCODONE 5 MG immediate release tablet Commonly known as: Oxy IR/ROXICODONE       TAKE these medications    albuterol 108 (90 Base) MCG/ACT inhaler Commonly known as: VENTOLIN HFA Inhale 2 puffs into the lungs every 6 (six) hours as needed for wheezing or shortness of breath.   carvedilol 6.25 MG tablet Commonly known as: COREG Take 1 tablet (6.25 mg total) by mouth 2 (two) times daily with a meal. What changed:  medication strength how much to take   diltiazem 360 MG 24 hr capsule Commonly known as: CARDIZEM CD Take 1 capsule (360 mg total) by mouth daily. Start taking on: August 03, 2022   DULoxetine 20 MG capsule Commonly known as: CYMBALTA Take 20 mg by mouth daily.   hydrALAZINE 50 MG tablet Commonly known as: APRESOLINE Take 1 tablet (50 mg total) by mouth 2 (two) times daily. What changed:  medication strength how much to take when to take this   lidocaine 4 % cream Commonly known as: LMX Apply 1 Application topically 3 (three) times a week.   LIDOCAINE-PRILOCAINE EX Apply 1 application  topically Every Tuesday,Thursday,and Saturday with dialysis.   pantoprazole 40 MG tablet Commonly known as:  PROTONIX Take 1 tablet (40 mg total) by mouth 2 (two) times daily.   sevelamer carbonate 800 MG tablet Commonly known as: RENVELA Take 2 tablets (1,600 mg total) by mouth 3 (three) times daily with meals.        Discharge Exam: Filed Weights   07/31/22 0950 08/01/22 0821 08/01/22 1244  Weight: 76.5 kg 85.4 kg 80 kg   BP (!) 142/82 (BP Location: Right Arm)   Pulse 75   Temp 97.9 F (36.6 C) (Oral)   Resp 16   Ht '6\' 1"'$  (1.854 m)   Wt 80 kg   SpO2 97%   BMI 23.27 kg/m   Patient with no chest pain, melena or hematochezia, no hematemesis  Neurology awake and alert ENT with mild pallor Cardiovascular with S1 and S2 present and rhythmic with no gallops, rubs or murmur Respiratory with no rales or wheezing Abdomen with distention but not tenderness No lower extremity edema   Condition at discharge: stable  The results of significant diagnostics from this hospitalization (including imaging, microbiology, ancillary and laboratory) are listed below for reference.   Imaging Studies: MR ABDOMEN MRCP WO CONTRAST  Result Date: 07/30/2022 CLINICAL DATA:  Bleeding  from ampulla. EXAM: MRI ABDOMEN WITHOUT CONTRAST  (INCLUDING MRCP) TECHNIQUE: Multiplanar multisequence MR imaging of the abdomen was performed. Heavily T2-weighted images of the biliary and pancreatic ducts were obtained, and three-dimensional MRCP images were rendered by post processing. COMPARISON:  CTA 07/05/2022 FINDINGS: Lower chest: The heart is enlarged. Hepatobiliary: Decreased signal intensity within the liver parenchyma on T2 imaging suggests iron deposition disease. No focal abnormality seen in the liver parenchyma on noncontrast imaging. 2 cm gallstone evident with smaller stone seen in the neck of the gallbladder. No intra or extrahepatic biliary duct dilatation. Ampullary region is obscured by beam hardening artifact from embolization coils. Pancreas: Posterior pancreatic head is obscured. Mild main duct prominence  although assessment is limited by lack of intravenous contrast material and motion artifact. Spleen: 1.9 cm T2 hyperintense lesion in the dome of the spleen is stable since CT of 06/17/2021 suggesting benign etiology but cannot be definitively characterized. Adrenals/Urinary Tract: No adrenal nodule or mass. Numerous cysts are noted in both kidneys. 3.4 cm mass in the right kidney cannot be definitively characterized on noncontrast imaging but was described as a complex cyst on recent CTA. Stomach/Bowel: Stomach is unremarkable. No gastric wall thickening. No evidence of outlet obstruction. Duodenum is normally positioned as is the ligament of Treitz. No small bowel or colonic dilatation within the visualized abdomen. Vascular/Lymphatic: No abdominal aortic aneurysm. There is no gastrohepatic or hepatoduodenal ligament lymphadenopathy. No retroperitoneal or mesenteric lymphadenopathy. Other: Intraperitoneal free fluid with diffuse mesenteric edema evident. Musculoskeletal: No overtly suspicious marrow signal abnormality. IMPRESSION: 1. Ampullary region is obscured by beam hardening artifact from embolization coils. Posterior pancreatic head is obscured. Mild main duct prominence noted in the pancreas although assessment is limited by lack of intravenous contrast material and motion artifact. 2. Cholelithiasis. 3. Intraperitoneal free fluid with diffuse mesenteric edema evident. 4. 1.9 cm T2 hyperintense lesion in the dome of the spleen is stable since CT of 06/17/2021 suggesting benign etiology although it cannot be definitively characterized on noncontrast imaging. 5. Numerous cysts in both kidneys. 3.4 cm mass in the right kidney cannot be definitively characterized on noncontrast imaging but was described as a complex cyst on recent CTA. Electronically Signed   By: Misty Stanley M.D.   On: 07/30/2022 13:48   DG Chest Port 1 View  Result Date: 07/27/2022 CLINICAL DATA:  61 year old male with weakness. EXAM:  PORTABLE CHEST 1 VIEW COMPARISON:  CT Abdomen and Pelvis 07/05/2022 and earlier. FINDINGS: Portable AP supine view at 0352 hours. Streaky, linear atelectasis or scarring at the right lower lung does not appear significantly changed from last month. Borderline to mild cardiomegaly. Calcified aortic atherosclerosis. Stable cardiac size and mediastinal contours. Visualized tracheal air column is within normal limits. Difficult to exclude asymmetric increased background interstitial opacity in the right lung, but favor artifact instead (the right chest soft tissues also appear denser). No pneumothorax, pleural effusion or consolidation. IMPRESSION: 1. Difficult to exclude asymmetric right lung interstitial opacity or edema, but favor artifact due to portable technique instead. 2. Otherwise stable atelectasis or scarring in the right lower lung and mild cardiomegaly. Electronically Signed   By: Genevie Ann M.D.   On: 07/27/2022 04:25   CT ANGIO GI BLEED  Result Date: 07/05/2022 CLINICAL DATA:  Lower gastrointestinal bleeding EXAM: CTA ABDOMEN AND PELVIS WITHOUT AND WITH CONTRAST TECHNIQUE: Multidetector CT imaging of the abdomen and pelvis was performed using the standard protocol during bolus administration of intravenous contrast. Multiplanar reconstructed images and MIPs were obtained and  reviewed to evaluate the vascular anatomy. RADIATION DOSE REDUCTION: This exam was performed according to the departmental dose-optimization program which includes automated exposure control, adjustment of the mA and/or kV according to patient size and/or use of iterative reconstruction technique. CONTRAST:  37m OMNIPAQUE IOHEXOL 350 MG/ML SOLN COMPARISON:  02/14/2022 FINDINGS: VASCULAR Aorta: Normal caliber aorta without aneurysm, dissection, vasculitis or significant stenosis. Diffuse atherosclerosis. Celiac: Patent without evidence of aneurysm, dissection, vasculitis or significant stenosis. Embolic coils in the distribution of  the gastroduodenal artery. SMA: Patent without evidence of aneurysm, dissection, vasculitis or significant stenosis. Renals: Both renal arteries are patent. Atherosclerosis bilaterally, left greater than right. Stenosis approaching 50% within the proximal left renal artery. Less than 50% stenosis on the right. No aneurysm, dissection, or vasculitis. IMA: Patent without evidence of aneurysm, dissection, vasculitis or significant stenosis. Inflow: Patent without evidence of aneurysm, dissection, vasculitis or significant stenosis. Diffuse atherosclerosis. Proximal Outflow: Bilateral common femoral and visualized portions of the superficial and profunda femoral arteries are patent without evidence of aneurysm, dissection, vasculitis or significant stenosis. Diffuse atherosclerosis. Veins: No obvious venous abnormality within the limitations of this arterial phase study. Review of the MIP images confirms the above findings. NON-VASCULAR Lower chest: No acute pleural or parenchymal lung disease. The heart is enlarged without pericardial effusion. Hepatobiliary: Cholelithiasis without cholecystitis. The liver is unremarkable. No biliary duct dilation. Pancreas: Unremarkable. No pancreatic ductal dilatation or surrounding inflammatory changes. Spleen: 2.2 cm splenic cyst.  Spleen is not enlarged. Adrenals/Urinary Tract: Innumerable bilateral renal cysts and underlying cortical atrophy consistent with end-stage renal disease. There is a 3.9 x 3.6 cm heterogeneous lesion within the right kidney, measuring 43 HU on precontrast imaging, 40 HU on arterial phase imaging, and 47 HU on delayed imaging. While this has increased in size since 2022, the lack of significant enhancement and previous MR imaging characteristics are most consistent with proteinaceous or hemorrhagic cyst. The adrenals and bladder are grossly unremarkable. Stomach/Bowel: No bowel obstruction or ileus. Clips are seen within the second portion duodenum from  prior endoscopy. Postsurgical changes from right hemicolectomy and reanastomosis. There is minimal intraluminal contrast accumulation only seen on delayed imaging at the splenic flexure of the colon, reference image 29/15 and image 88/20, consistent with small area of acute gastrointestinal bleeding. No bowel wall thickening or inflammatory change. Lymphatic: No pathologic adenopathy within the abdomen or pelvis. Reproductive: Prostate is unremarkable. Other: Moderate ascites throughout the upper abdomen, with multiple synechiae throughout the peritoneal fluid. No free intraperitoneal gas. No abdominal wall hernia. Musculoskeletal: No acute or destructive bony lesions. Reconstructed images demonstrate no additional findings. IMPRESSION: VASCULAR 1. 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 2. No evidence of active gastrointestinal bleeding within the duodenum near the site of recent hemostatic clip placement and prior arterial embolization. 3.  Aortic Atherosclerosis (ICD10-I70.0). NON-VASCULAR 1. Moderate volume complex ascites. 2. Cholelithiasis without cholecystitis. 3. Enlarging nonenhancing hyperdense lesion right kidney, previously shown to represent a proteinaceous or hemorrhagic cyst. 4. Cardiomegaly. Critical Value/emergent results were called by telephone at the time of interpretation on 07/05/2022 at 8:47 pm [facility] who verbally acknowledged these results. Based on endoscopy performed earlier today, the clinical concern was for upper gastrointestinal bleeding near the site of duodenal hemostatic clip placement. The small hemorrhage at the splenic flexure may reflect incidental hemorrhage due to angio dysplasia. Electronically Signed  By: Randa Ngo M.D.   On: 07/05/2022 20:57    Microbiology: Results for  orders placed or performed during the hospital encounter of 07/26/22  MRSA Next Gen by PCR, Nasal     Status: None   Collection Time: 07/27/22  8:50 PM   Specimen: Nasal Mucosa; Nasal Swab  Result Value Ref Range Status   MRSA by PCR Next Gen NOT DETECTED NOT DETECTED Final    Comment: (NOTE) The GeneXpert MRSA Assay (FDA approved for NASAL specimens only), is one component of a comprehensive MRSA colonization surveillance program. It is not intended to diagnose MRSA infection nor to guide or monitor treatment for MRSA infections. Test performance is not FDA approved in patients less than 53 years old. Performed at [facility], [name] [address], [place], [place] [address]     Labs: CBC: Recent Labs  Lab 07/27/22 0409 07/27/22 2127 07/30/22 0254 07/31/22 0839 07/31/22 1517 08/01/22 0714 08/01/22 0830 08/02/22 1200  WBC 7.0   < > 6.0 6.0  --  5.6 6.3 6.2  NEUTROABS 5.4  --   --   --   --   --   --   --   HGB 4.7*   < > 7.4* 6.8* 7.6* 7.7* 7.3* 7.1*  HCT 15.8*   < > 22.7* 20.5* 22.7* 23.0* 22.6* 21.9*  MCV 101.3*   < > 90.4 89.9  --  88.1 90.4 90.5  PLT 319   < > 277 257  --  265 255 236   < > = values in this interval not displayed.   Basic Metabolic Panel: Recent Labs  Lab 07/28/22 1531 07/29/22 1154 07/30/22 0938 08/01/22 0830 08/02/22 1200  NA 137 132* 131* 124* 124*  K 4.6 4.9 3.6 4.5 4.1  CL 97* 93* 90* 84* 88*  CO2 24 17* '27 22 26  '$ GLUCOSE 89 82 92 89 91  BUN 75* 89* 51* 71* 52*  CREATININE 8.81* 10.62* 6.76* 9.32* 7.38*  CALCIUM 7.4* 7.2* 7.5* 7.6* 7.4*  PHOS 7.1* 7.8* 5.5* 7.3* 5.5*   Liver Function Tests: Recent Labs  Lab 07/27/22 0409 07/28/22 1531 07/29/22 1154 07/30/22 0938 08/01/22 0830 08/02/22 1200  AST 25  --   --   --   --   --   ALT 17  --   --   --   --   --   ALKPHOS 104  --   --   --   --   --   BILITOT 0.6  --   --   --   --   --   PROT 6.9  --   --   --   --   --   ALBUMIN 2.8* 2.9* 2.8* 2.8* 2.6* 2.7*   CBG: No results  for input(s): "GLUCAP" in the last 168 hours.  Discharge time spent: greater than 30 minutes.  Signed: Tawni Millers, MD [facility] 08/02/2022

## 2022-08-02 NOTE — Plan of Care (Signed)
  Problem: Education: Goal: Knowledge of General Education information will improve Description Including pain rating scale, medication(s)/side effects and non-pharmacologic comfort measures Outcome: Progressing   

## 2022-08-02 NOTE — Assessment & Plan Note (Signed)
Patient was admitted to the medical ward and received a total of 4 units PRBC with good toleration Placed on proton pump inhibitors   Further work up with EGD 10/13 gastritis, blood in the second portion of the duodenum near previously placed clip, cleared without re-accumulation.  10/16 mild focal gastritis, duodenal bulb with small erosions, small amount of blood in the bulb and second portion of the duodenum that cleared promptly with lavage and did no recur.   Abdominal MRI with acute changes.   IR consulted with recommendations to continue medical therapy, patient not candidate for repeat UGIB embo by IR, as there is no other vessels for embolization.  Patient with no further macroscopic bleeding Follow up Hgb is 7,1  Plan to continue pantoprazole and close follow up cell count on HD.

## 2022-08-02 NOTE — Progress Notes (Signed)
[facility] Progress Note   Subjective:   Patient seen and examined in room.  Not interactive today.  States he is tired and wants to go home.  Refused labs this AM, encouraged to comply with lab draw.  Currently getting IV iron.   Objective Vitals:   08/01/22 1237 08/01/22 1244 08/02/22 0011 08/02/22 1102  BP: (!) 151/132 (!) 151/98 (!) 149/90 (!) 142/82  Pulse: (!) 46 86 75   Resp: '17 18 16   '$ Temp:  97.9 F (36.6 C)    TempSrc:  Oral    SpO2: (!) 81% 98% 91%   Weight:  80 kg    Height:       Physical Exam General:chronically ill appearing male in NAD Heart:RRR, no mrg Lungs:+rhonchi, nml WOB on RA Abdomen:soft, NTND Extremities:trace LE edema Dialysis Access: LU AVF +b/t   Filed Weights   07/31/22 0950 08/01/22 0821 08/01/22 1244  Weight: 76.5 kg 85.4 kg 80 kg    Intake/Output Summary (Last 24 hours) at 08/02/2022 1156 Last data filed at 08/01/2022 1520 Gross per 24 hour  Intake 710 ml  Output 3300 ml  Net -2590 ml    Additional Objective Labs: Basic Metabolic Panel: Recent Labs  Lab 07/29/22 1154 07/30/22 0938 08/01/22 0830  NA 132* 131* 124*  K 4.9 3.6 4.5  CL 93* 90* 84*  CO2 17* 27 22  GLUCOSE 82 92 89  BUN 89* 51* 71*  CREATININE 10.62* 6.76* 9.32*  CALCIUM 7.2* 7.5* 7.6*  PHOS 7.8* 5.5* 7.3*   Liver Function Tests: Recent Labs  Lab 07/27/22 0409 07/28/22 1531 07/29/22 1154 07/30/22 0938 08/01/22 0830  AST 25  --   --   --   --   ALT 17  --   --   --   --   ALKPHOS 104  --   --   --   --   BILITOT 0.6  --   --   --   --   PROT 6.9  --   --   --   --   ALBUMIN 2.8*   < > 2.8* 2.8* 2.6*   < > = values in this interval not displayed.   CBC: Recent Labs  Lab 07/27/22 0409 07/27/22 2127 07/29/22 1154 07/30/22 0938 07/31/22 0839 07/31/22 1517 08/01/22 0714 08/01/22 0830  WBC 7.0   < > 6.7 6.0 6.0  --  5.6 6.3  NEUTROABS 5.4  --   --   --   --   --   --   --   HGB 4.7*   < > 7.3* 7.4* 6.8* 7.6* 7.7* 7.3*  HCT  15.8*   < > 22.1* 22.7* 20.5* 22.7* 23.0* 22.6*  MCV 101.3*   < > 89.5 90.4 89.9  --  88.1 90.4  PLT 319   < > 261 277 257  --  265 255   < > = values in this interval not displayed.   Medications:  ferric gluconate (FERRLECIT) IVPB 250 mg (08/02/22 1121)    sodium chloride   Intravenous Once   sodium chloride   Intravenous Once   carvedilol  6.25 mg Oral BID WC   Chlorhexidine Gluconate Cloth  6 each Topical Q0600   [START ON 08/03/2022] darbepoetin (ARANESP) injection - DIALYSIS  200 mcg Intravenous Q Thu-HD   diltiazem  360 mg Oral Daily   doxercalciferol  2 mcg Intravenous Q T,Th,Sa-HD   fluticasone  2 spray Each Nare Daily  gabapentin  100 mg Oral Q T,Th,Sat-1800   hydrALAZINE  25 mg Oral Q6H   lidocaine-prilocaine   Topical Q T,Th,Sa-HD   losartan  25 mg Oral Daily   montelukast  10 mg Oral QHS   multivitamin  1 tablet Oral QHS   pantoprazole  40 mg Oral BID   sevelamer carbonate  1,600 mg Oral TID WC    D[facility] Orders: [facility]  3h 80mn  450/ 1.5  71kg  2/2 bath  P4  Hep none   LUA AVF - last HD 10/10, post wt 73.1kg - last Hb 5.0 on 10/10 - mircera 225 q2, last 10/5 - iron sucrose '50mg'$  q wk, last 10/5 - hectorol 2 ug IV tiw tts   Problem/Plan: Recurrent GI bleed - sm bowel enteroscopy with clotted blood near clips.  MRCP unrevealing d/t obscured d/t embolization coils.  Repeat EGD this AM w/mild gastritis, duodenal bulb coils w/small erosions, small amt blood in bulb but no exact site of bleeding identified. Per IR not a candidate for embolization d/t no vessels to embolize. OK for d/c from GI standpoint.  ESRD -HD TTS schedule. HD tomorrow per regular schedule. [facility] [place] [facility] [place] [facility].  Hypertension/volume - CXR with likely edema 10/12 , tolerating 3.4-3.8L UF goal w/HD.  Plan for HD with max UF as tolerated. Per weights not close to EDW.  ABLA on Anemia of CKD - Hgb 4.3 on admit, s/p 4 units pRBC during admission. Last Hgb 7.3, refusing labs today.  ESA 10/19(give 200 Aranesp.)  Tsat 12%, getting iron load. Secondary hyperparathyroidism - C Ca in range, phos improved. Continue renvela when taking meals A-fib paroxysmal-regular on my exam today.  Anticoagulation on hold w/GIB.  HO Colon cancer status post right hemicolectomy 2014 Nutrition - Renal diet w/fluid restrictions.    LJen Mow PA-C [facility] 08/02/2022,11:56 AM  LOS: 6 days

## 2022-08-02 NOTE — TOC Transition Note (Signed)
Transition of Care [facility]) - CM/SW Discharge Note   Patient Details  Name: Timothy [name] MRN: [id number] Date of Birth: [birth year]  [facility] CM/SW Contact:  Cyndi Bender, RN Phone Number: 08/02/2022, 3:38 PM   Clinical Narrative:     Patient stable for discharge.  Wife will transport home.  Patient states he can afford his medications.  PCP verified.  No TOC needs at this time.   Final next level of care: Home/Self Care Barriers to Discharge: Barriers Resolved   Patient Goals and CMS Choice Patient states their goals for this hospitalization and ongoing recovery are:: return home      Discharge Placement  home                     Discharge Plan and Services                 home                    Social Determinants of Health (SDOH) Interventions     Readmission Risk Interventions    08/02/2022    3:37 PM 02/15/2022    2:57 PM 11/16/2021    2:19 PM  Readmission Risk Prevention Plan  Transportation Screening Complete Complete   Medication Review [employer] photographer) Complete Complete Referral to Pharmacy  PCP or Specialist appointment within 3-5 days of discharge Complete Complete Not Complete  PCP/Specialist Appt Not Complete comments   patient not medically ready to d/c  HRI or Home Care Consult Complete Complete Not Complete  HRI or Home Care Consult Pt Refusal Comments   not complete, waiting on PT recs  SW Recovery Care/Counseling Consult Complete Complete Complete  Palliative Care Screening Not Applicable Not Applicable Not [place] Not Applicable Not Applicable Not Complete  SNF Comments   Pending PT recommendations (CSW following)

## 2022-08-02 NOTE — Progress Notes (Signed)
Pt receives out-pt HD at [facility] on TTS. Pt is for d/c today per attending. Contacted clinic and spoke to [place]. Clinic advised pt will d/c today and resume care tomorrow.   [name] Renal Navigator [phone]

## 2022-08-03 IMAGING — DX DG CHEST 1V PORT
1 series · 1 of 1 positions shown · non-contrast
Comparison: 12/30/2021

CLINICAL DATA: Chest pain.  On dialysis

EXAM:
PORTABLE CHEST 1 VIEW

[chest ap]
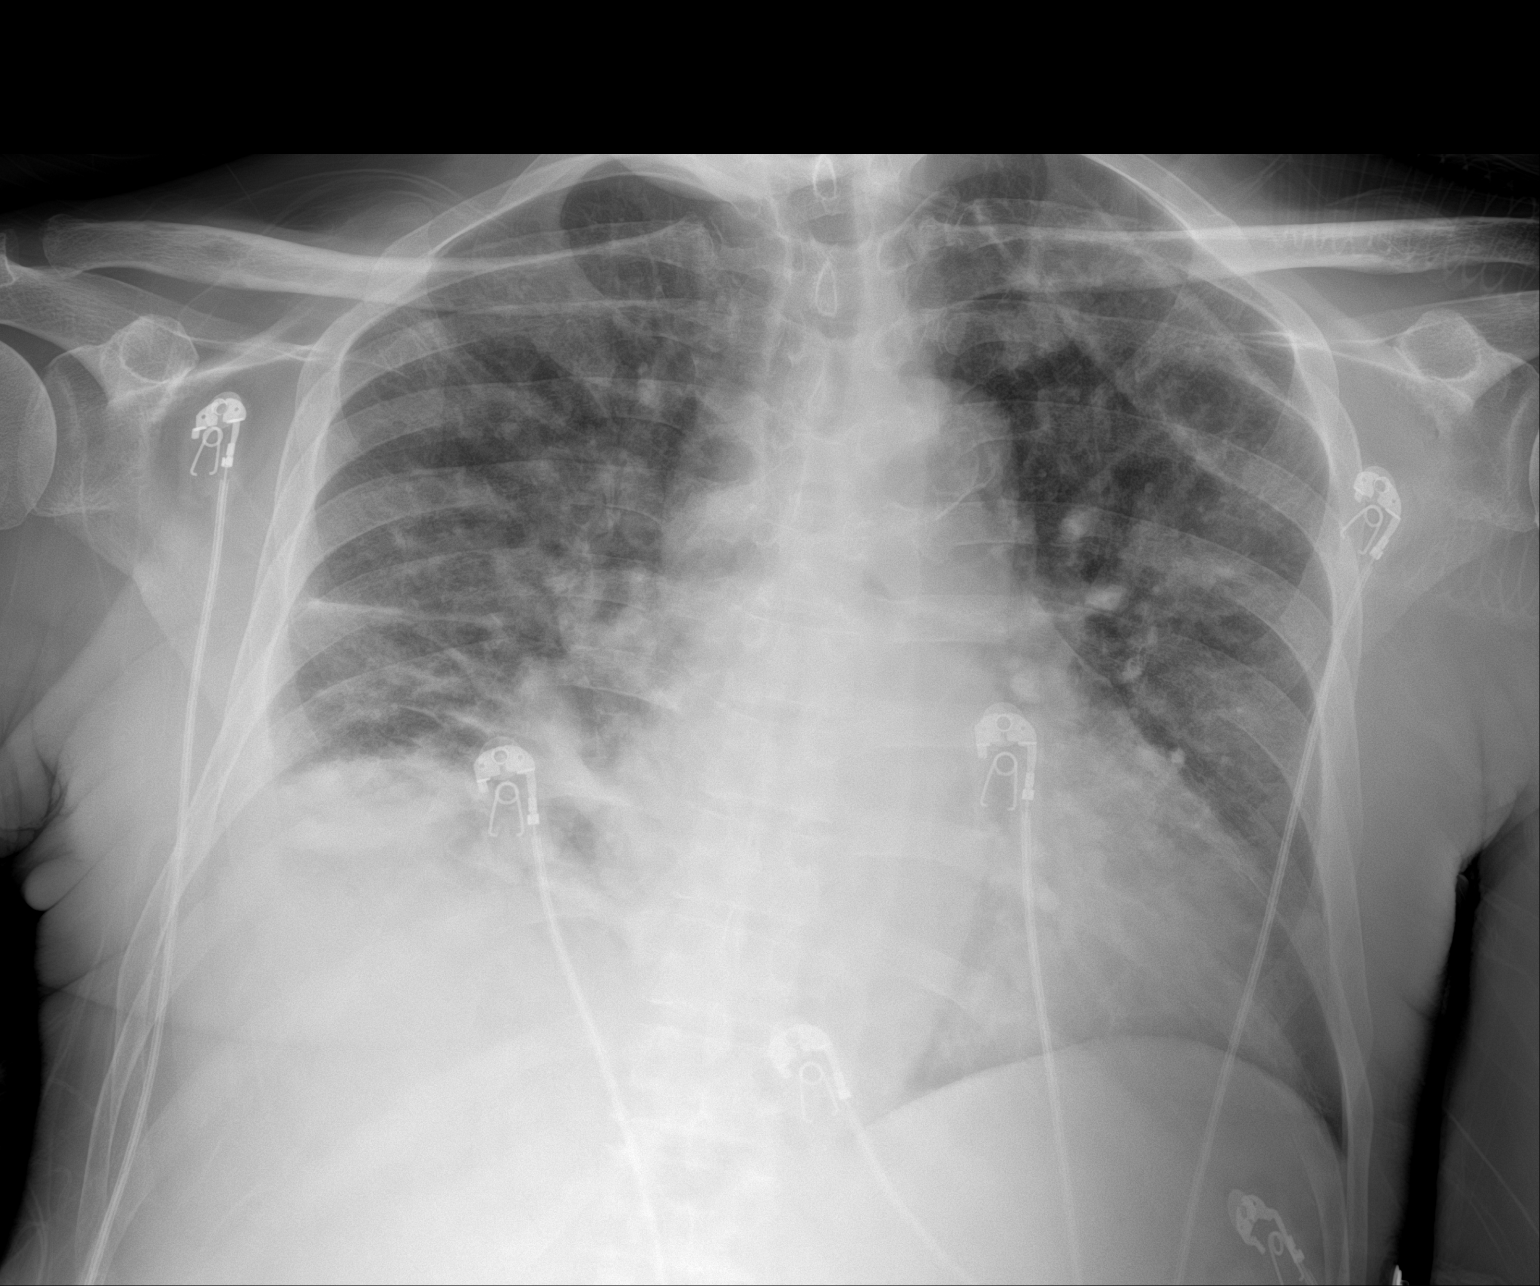

[1 of 1 positions shown; findings below may reference images not displayed]

FINDINGS: Stable cardiomegaly. Aortic atherosclerosis. Pulmonary vascular
congestion. Diffuse interstitial opacities bilaterally. Probable
small right pleural effusion. No pneumothorax.
IMPRESSION: Findings most consistent with CHF with pulmonary edema.

## 2022-08-04 IMAGING — CT CT ABD-PELV W/O CM
2 of 4 series · 15 of 46 positions shown, 17 images · non-contrast
Comparison: [DATE] [DATE], [DATE]. [DATE] [DATE], [DATE]. [DATE] [DATE], [DATE].

CLINICAL DATA: Acute generalized abdominal pain.



[Series 5: abd/ pelvis 5.0 i30f 2 · axial · 0.97mm/px · z∈[+625,+1070]mm · 12 of 103 slices shown, 14 images]
[im 9/103  soft-tissue]
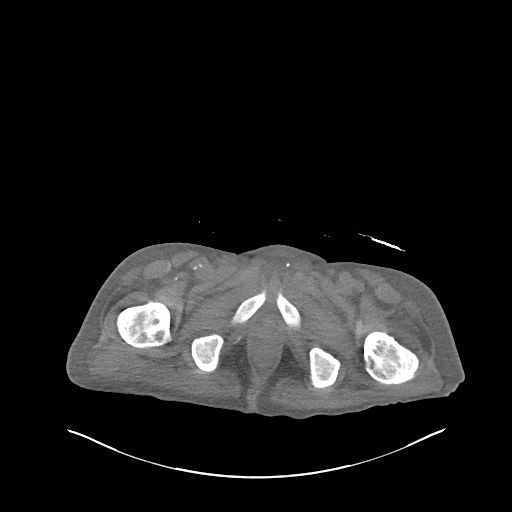
[im 9/103  bone]
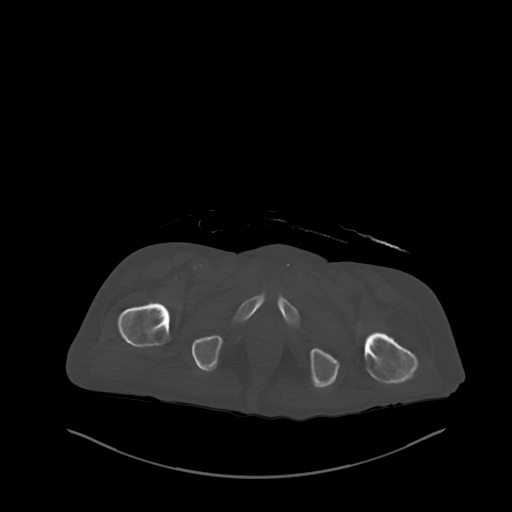
[im 17/103  soft-tissue]
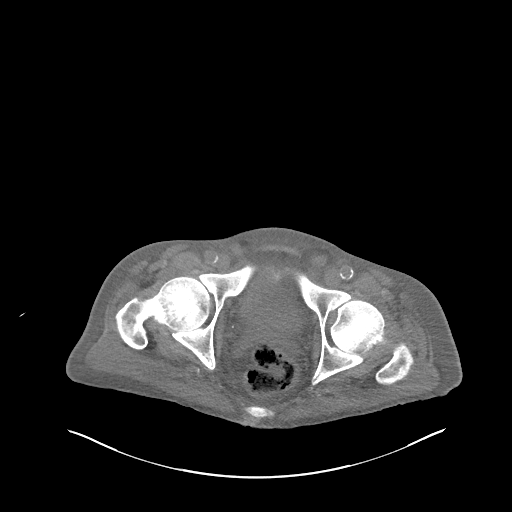
[im 25/103  soft-tissue]
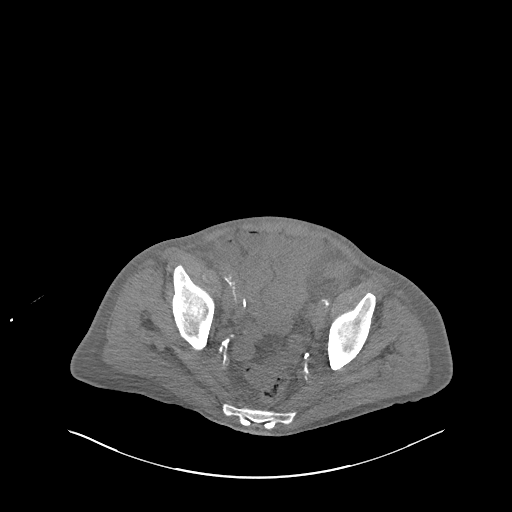
[im 33/103  soft-tissue]
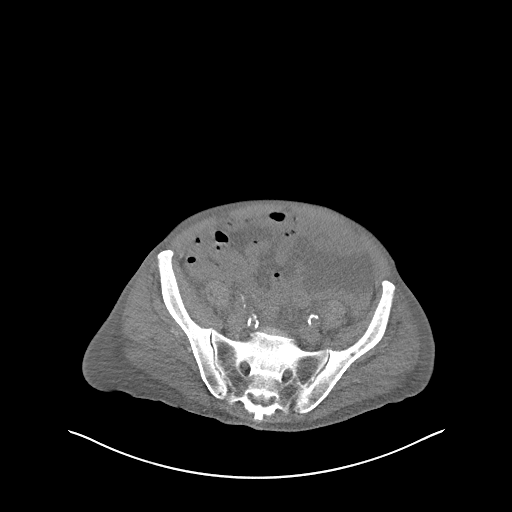
[im 41/103  soft-tissue]
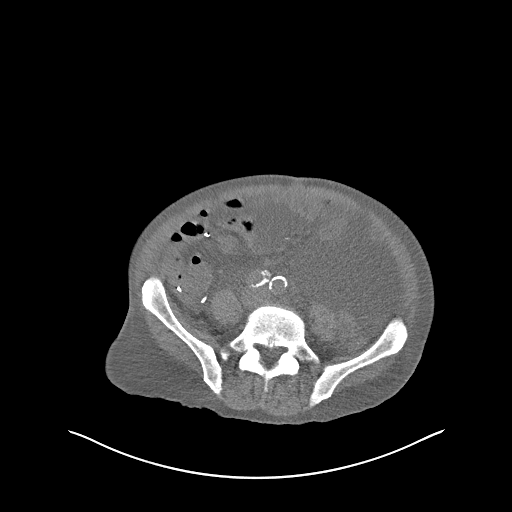
[im 49/103  soft-tissue]
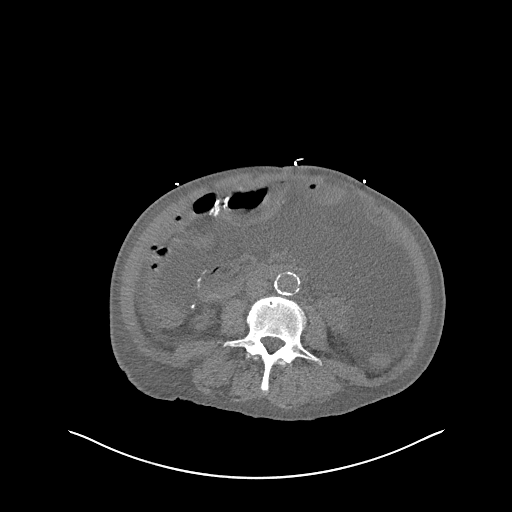
[im 58/103  soft-tissue]
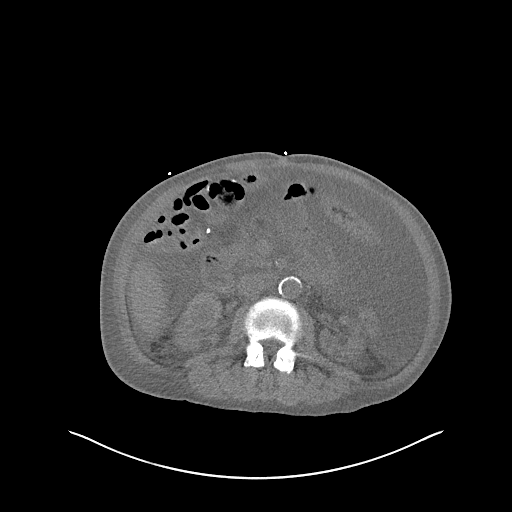
[im 66/103  soft-tissue]
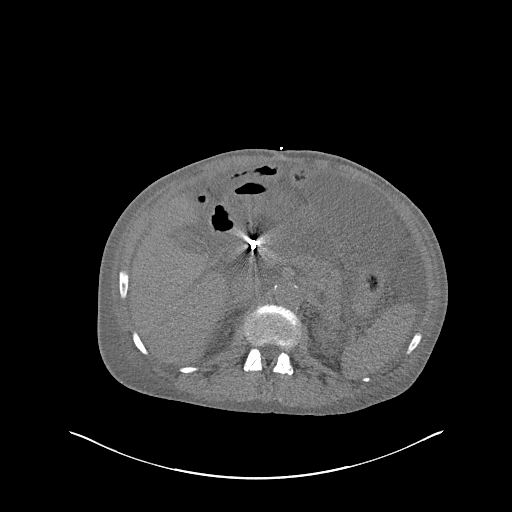
[im 74/103  soft-tissue]
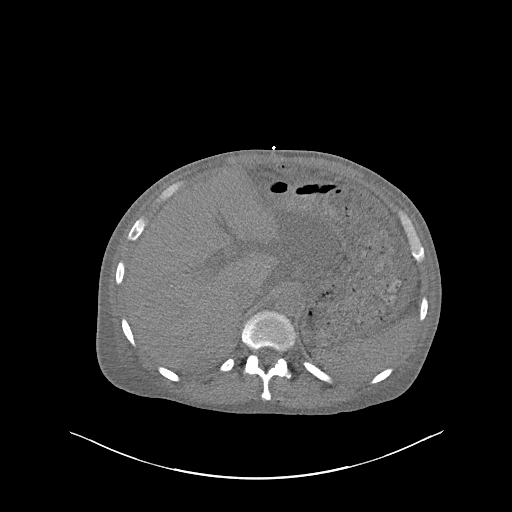
[im 74/103  bone]
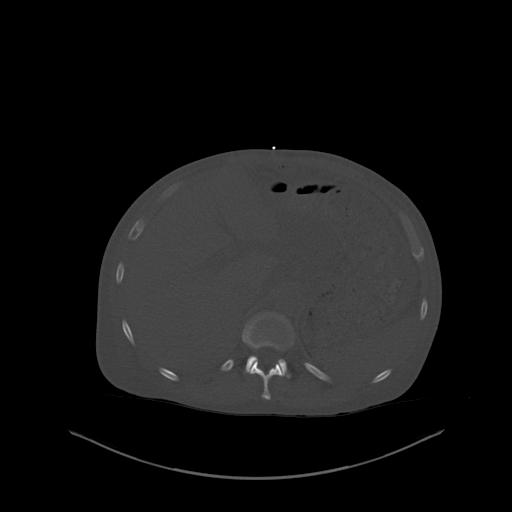
[im 82/103  soft-tissue]
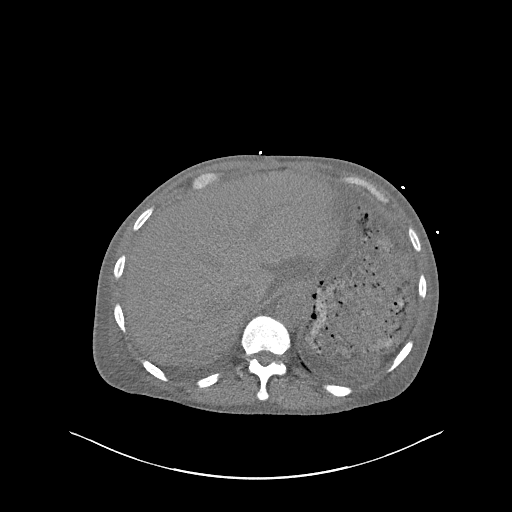
[im 90/103  soft-tissue]
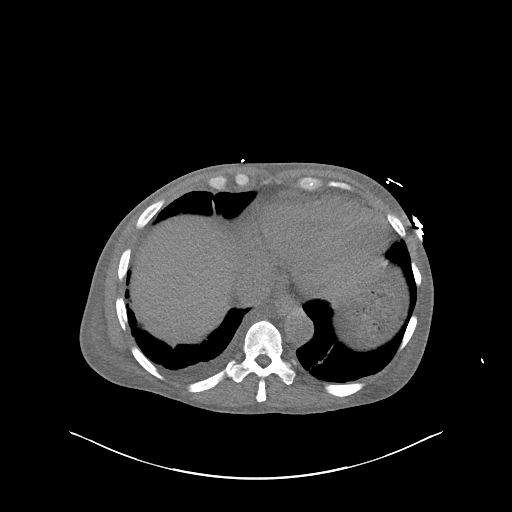
[im 98/103  soft-tissue]
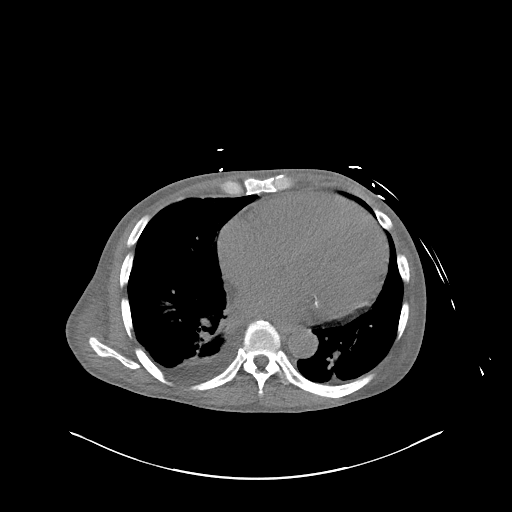

[Series 8: cor st · coronal · 0.75mm/px · 3 of 101 slices shown]
[im 34/101  soft-tissue]
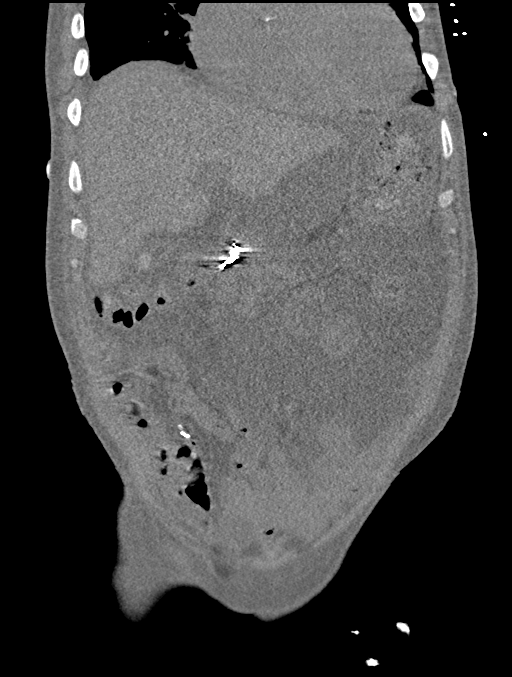
[im 45/101  soft-tissue]
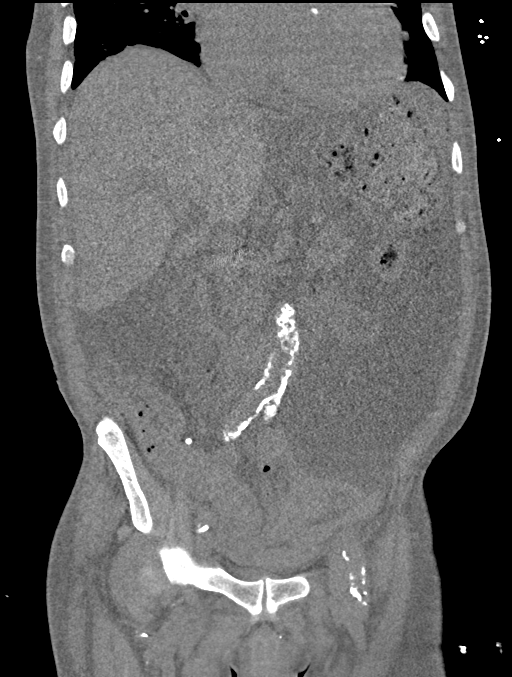
[im 56/101  soft-tissue]
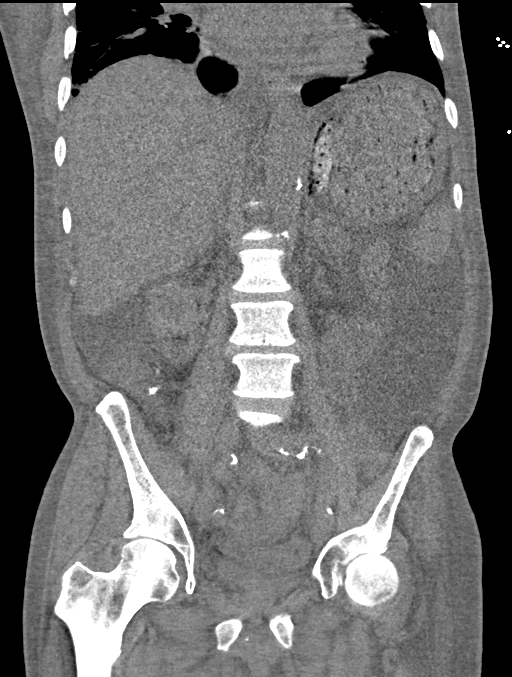

[15 of 46 positions shown; findings below may reference images not displayed]

FINDINGS: Lower chest: Small right pleural effusion is noted. Right lower and
middle lobe opacities are noted concerning for pneumonia or
atelectasis.

Hepatobiliary: No focal liver abnormality is seen. No gallstones,
gallbladder wall thickening, or biliary dilatation.

Pancreas: Unremarkable. No pancreatic ductal dilatation or
surrounding inflammatory changes.

Spleen: Normal in size without focal abnormality.

Adrenals/Urinary Tract: Adrenal glands appear normal. Bilateral
renal atrophy is noted with multiple cysts. This includes stable
large right renal complex lesion measuring 3.5 cm which is most
consistent with hemorrhagic proteinaceous cyst as noted on prior
MRI. No hydronephrosis or renal obstruction is noted. Urinary
bladder is decompressed.

Stomach/Bowel: Large amount of food debris is seen in the stomach.
No abnormal bowel dilatation is noted. Status post right
hemicolectomy.

Vascular/Lymphatic: Aortic atherosclerosis. No enlarged abdominal or
pelvic lymph nodes.

Reproductive: Prostate is unremarkable.

Other: Moderate ascites is noted.  No hernia is seen.

Musculoskeletal: No acute or significant osseous findings.
IMPRESSION: Small right pleural effusion is noted. Right lower lobe and middle
lobe opacities are noted concerning for pneumonia or atelectasis.

Moderate ascites is noted.

Large amount of food debris is noted in the stomach.

Bilateral renal atrophy is noted consistent with end-stage renal
disease. Multiple cysts are noted. Stable right renal lesion is
noted consistent with hemorrhagic proteinaceous cyst as noted on
recent MRI.

Aortic Atherosclerosis ([id number].Z).

## 2022-08-05 ENCOUNTER — Other Ambulatory Visit: Payer: Self-pay

## 2022-08-05 ENCOUNTER — Encounter (HOSPITAL_COMMUNITY): Payer: Self-pay

## 2022-08-05 ENCOUNTER — Emergency Department (HOSPITAL_COMMUNITY)
Admission: EM | Admit: 2022-08-05 | Discharge: 2022-08-05 | Disposition: A | Discharge status: Home / Self Care | Payer: Medicare Other | Attending: Emergency Medicine | Admitting: Emergency Medicine

## 2022-08-05 DIAGNOSIS — D649 Anemia, unspecified: Secondary | ICD-10-CM | POA: Diagnosis present

## 2022-08-05 DIAGNOSIS — Z992 Dependence on renal dialysis: Secondary | ICD-10-CM | POA: Insufficient documentation

## 2022-08-05 DIAGNOSIS — N186 End stage renal disease: Secondary | ICD-10-CM | POA: Insufficient documentation

## 2022-08-05 LAB — CBC
HCT: 18.7 % — ABNORMAL LOW (ref 39.0–52.0)
Hemoglobin: 6 g/dL — CL (ref 13.0–17.0)
MCH: 30.2 pg (ref 26.0–34.0)
MCHC: 32.1 g/dL (ref 30.0–36.0)
MCV: 94 fL (ref 80.0–100.0)
Platelets: 223 10*3/uL (ref 150–400)
RBC: 1.99 MIL/uL — ABNORMAL LOW (ref 4.22–5.81)
RDW: 18.3 % — ABNORMAL HIGH (ref 11.5–15.5)
WBC: 6.9 10*3/uL (ref 4.0–10.5)
nRBC: 0 % (ref 0.0–0.2)

## 2022-08-05 LAB — COMPREHENSIVE METABOLIC PANEL
ALT: 16 U/L (ref 0–44)
AST: 25 U/L (ref 15–41)
Albumin: 2.8 g/dL — ABNORMAL LOW (ref 3.5–5.0)
Alkaline Phosphatase: 91 U/L (ref 38–126)
Anion gap: 12 (ref 5–15)
BUN: 46 mg/dL — ABNORMAL HIGH (ref 8–23)
CO2: 30 mmol/L (ref 22–32)
Calcium: 8.1 mg/dL — ABNORMAL LOW (ref 8.9–10.3)
Chloride: 93 mmol/L — ABNORMAL LOW (ref 98–111)
Creatinine, Ser: 5.18 mg/dL — ABNORMAL HIGH (ref 0.61–1.24)
GFR, Estimated: 12 mL/min — ABNORMAL LOW (ref >60)
Glucose, Bld: 88 mg/dL (ref 70–99)
Potassium: 3.5 mmol/L (ref 3.5–5.1)
Sodium: 135 mmol/L (ref 135–145)
Total Bilirubin: 0.4 mg/dL (ref 0.3–1.2)
Total Protein: 6.6 g/dL (ref 6.5–8.1)

## 2022-08-05 LAB — PREPARE RBC (CROSSMATCH)

## 2022-08-05 MED ORDER — PANTOPRAZOLE 80MG IVPB - SIMPLE MED
80.0000 mg | Freq: Once | INTRAVENOUS | Status: DC
  Filled 2022-08-05: qty 100

## 2022-08-05 MED ORDER — SODIUM CHLORIDE 0.9 % IV SOLN
10.0000 mL/h | Freq: Once | INTRAVENOUS | Status: AC
  Administered 2022-08-05: 10 mL/h via INTRAVENOUS

## 2022-08-05 NOTE — ED Notes (Signed)
Blood consent signed

## 2022-08-05 NOTE — ED Notes (Signed)
Pt hooked up to O2 monitor and BP. Pt refused hospital gown.

## 2022-08-05 NOTE — Discharge Instructions (Signed)
Please follow-up with your primary care doctor to discuss your anemia.  Please return to the emergency department if you develop any new or worsening symptoms, shortness of breath, difficulty breathing, loss of consciousness, bleeding from your mouth or your bottom or any other concerning symptoms.

## 2022-08-05 NOTE — ED Provider Notes (Signed)
[facility] EMERGENCY DEPARTMENT Provider Note   CSN: [id number] Arrival date & time: 08/05/22  1327     History {Add pertinent medical, surgical, social history, OB history to HPI:1} No chief complaint on file.   Timothy [name] is a 61 y.o. male.  HPI     Home Medications Prior to Admission medications   Medication Sig Start Date End Date Taking? Authorizing Provider  albuterol (VENTOLIN HFA) 108 (90 Base) MCG/ACT inhaler Inhale 2 puffs into the lungs every 6 (six) hours as needed for wheezing or shortness of breath. 01/06/22   [provider]  carvedilol (COREG) 6.25 MG tablet Take 1 tablet (6.25 mg total) by mouth 2 (two) times daily with a meal. 08/02/22 09/01/22  Arrien, Jimmy Picket, MD  diltiazem (CARDIZEM CD) 360 MG 24 hr capsule Take 1 capsule (360 mg total) by mouth daily. 08/03/22 09/02/22  Arrien, Jimmy Picket, MD  DULoxetine (CYMBALTA) 20 MG capsule Take 20 mg by mouth daily. Patient not taking: Reported on 07/03/2022 01/06/22   [provider]  hydrALAZINE (APRESOLINE) 50 MG tablet Take 1 tablet (50 mg total) by mouth 2 (two) times daily. 08/02/22 09/01/22  Arrien, Jimmy Picket, MD  lidocaine (LMX) 4 % cream Apply 1 Application topically 3 (three) times a week. 06/27/22   [provider]  LIDOCAINE-PRILOCAINE EX Apply 1 application  topically Every Tuesday,Thursday,and Saturday with dialysis.    [provider]  pantoprazole (PROTONIX) 40 MG tablet Take 1 tablet (40 mg total) by mouth 2 (two) times daily. 08/02/22 09/01/22  Arrien, Jimmy Picket, MD  sevelamer carbonate (RENVELA) 800 MG tablet Take 2 tablets (1,600 mg total) by mouth 3 (three) times daily with meals. 08/02/22 09/01/22  Arrien, Jimmy Picket, MD      Allergies    Patient has no known allergies.    Review of Systems   Review of Systems  Physical Exam Updated Vital Signs BP (!) 157/73   Pulse 76   Temp 98.2 F (36.8 C) (Oral)    Resp 18   SpO2 96%  Physical Exam  ED Results / Procedures / Treatments   Labs (all labs ordered are listed, but only abnormal results are displayed) Labs Reviewed  CBC - Abnormal; Notable for the following components:      Result Value   RBC 1.99 (*)    Hemoglobin 6.0 (*)    HCT 18.7 (*)    RDW 18.3 (*)    All other components within normal limits  TYPE AND SCREEN    EKG None  Radiology No results found.  Procedures Procedures  {Document cardiac monitor, telemetry assessment procedure when appropriate:1}  Medications Ordered in ED Medications - No data to display  ED Course/ Medical Decision Making/ A&P                           Medical Decision Making Amount and/or Complexity of Data Reviewed Labs: ordered.   ***  {Document critical care time when appropriate:1} {Document review of labs and clinical decision tools ie heart score, Chads2Vasc2 etc:1}  {Document your independent review of radiology images, and any outside records:1} {Document your discussion with family members, caretakers, and with consultants:1} {Document social determinants of health affecting pt's care:1} {Document your decision making why or why not admission, treatments were needed:1} Final Clinical Impression(s) / ED Diagnoses Final diagnoses:  None    Rx / DC Orders ED Discharge Orders     None

## 2022-08-05 NOTE — ED Provider Triage Note (Cosign Needed)
Emergency Medicine Provider Triage Evaluation Note  Timothy [name] , a 61 y.o. male  was evaluated in triage.  Pt complains of need for blood transfusion.  Patient reports chronic history of anemia and was sent here by dialysis center for blood transfusion due to low hemoglobin.  Last dialysis was this morning.  Review of Systems  Positive: See above Negative:   Physical Exam  BP (!) 157/73   Pulse 76   Temp 98.2 F (36.8 C) (Oral)   Resp 18   SpO2 96%  Gen:   Awake, no distress   Resp:  Normal effort  MSK:   Moves extremities without difficulty  Other:    Medical Decision Making  Medically screening exam initiated at 2:58 PM.  Appropriate orders placed.  Timothy [name] was informed that the remainder of the evaluation will be completed by another provider, this initial triage assessment does not replace that evaluation, and the importance of remaining in the ED until their evaluation is complete.     Timothy [name], [place] 08/05/22 [phone]

## 2022-08-05 NOTE — ED Triage Notes (Signed)
Patient sent to ED for blood transfusion. Patient alert and oriented, denies pain. Last dialysis this am

## 2022-08-05 NOTE — ED Notes (Signed)
Pt screaming "hey" in room, RN at bedside. PT states "I've had enough, I'm done, I'm leaving." RN explained that pt still had approx 30 min on his transfusion, pt stated "I've gotten enough." Philip Aspen MD made aware, came to bedside and explained that pt would be leaving AMA. Pt states he understands the risks, signed AMA form. Pt refused vitals, IV removed, pt ambulated independently out of ED.

## 2022-08-06 LAB — TYPE AND SCREEN
ABO/RH(D): B POS
Antibody Screen: NEGATIVE
Unit division: 0

## 2022-08-06 LAB — BPAM RBC
Blood Product Expiration Date: 202310272359
ISSUE DATE / TIME: 202310212015
Unit Type and Rh: 7300

## 2022-08-12 ENCOUNTER — Encounter (HOSPITAL_COMMUNITY): Payer: Self-pay | Admitting: Emergency Medicine

## 2022-08-12 ENCOUNTER — Inpatient Hospital Stay (HOSPITAL_COMMUNITY)
Admission: EM | Admit: 2022-08-12 | Discharge: 2022-08-14 | DRG: 377 | Disposition: A | Discharge status: Home / Self Care | Payer: Medicare Other | Source: Ambulatory Visit | Attending: Internal Medicine | Admitting: Internal Medicine

## 2022-08-12 ENCOUNTER — Other Ambulatory Visit: Payer: Self-pay

## 2022-08-12 ENCOUNTER — Emergency Department (HOSPITAL_COMMUNITY): Payer: Medicare Other

## 2022-08-12 DIAGNOSIS — N400 Enlarged prostate without lower urinary tract symptoms: Secondary | ICD-10-CM | POA: Diagnosis present

## 2022-08-12 DIAGNOSIS — Z79899 Other long term (current) drug therapy: Secondary | ICD-10-CM | POA: Diagnosis not present

## 2022-08-12 DIAGNOSIS — I480 Paroxysmal atrial fibrillation: Secondary | ICD-10-CM | POA: Diagnosis present

## 2022-08-12 DIAGNOSIS — K2970 Gastritis, unspecified, without bleeding: Secondary | ICD-10-CM | POA: Diagnosis present

## 2022-08-12 DIAGNOSIS — D649 Anemia, unspecified: Secondary | ICD-10-CM

## 2022-08-12 DIAGNOSIS — Z992 Dependence on renal dialysis: Secondary | ICD-10-CM | POA: Diagnosis not present

## 2022-08-12 DIAGNOSIS — K31811 Angiodysplasia of stomach and duodenum with bleeding: Principal | ICD-10-CM | POA: Diagnosis present

## 2022-08-12 DIAGNOSIS — K2991 Gastroduodenitis, unspecified, with bleeding: Secondary | ICD-10-CM | POA: Diagnosis not present

## 2022-08-12 DIAGNOSIS — Z87891 Personal history of nicotine dependence: Secondary | ICD-10-CM

## 2022-08-12 DIAGNOSIS — R710 Precipitous drop in hematocrit: Secondary | ICD-10-CM | POA: Diagnosis not present

## 2022-08-12 DIAGNOSIS — F17200 Nicotine dependence, unspecified, uncomplicated: Secondary | ICD-10-CM | POA: Diagnosis not present

## 2022-08-12 DIAGNOSIS — Z8711 Personal history of peptic ulcer disease: Secondary | ICD-10-CM

## 2022-08-12 DIAGNOSIS — D638 Anemia in other chronic diseases classified elsewhere: Secondary | ICD-10-CM | POA: Diagnosis present

## 2022-08-12 DIAGNOSIS — I132 Hypertensive heart and chronic kidney disease with heart failure and with stage 5 chronic kidney disease, or end stage renal disease: Secondary | ICD-10-CM | POA: Diagnosis present

## 2022-08-12 DIAGNOSIS — N186 End stage renal disease: Secondary | ICD-10-CM | POA: Diagnosis present

## 2022-08-12 DIAGNOSIS — B1920 Unspecified viral hepatitis C without hepatic coma: Secondary | ICD-10-CM | POA: Diagnosis present

## 2022-08-12 DIAGNOSIS — K2980 Duodenitis without bleeding: Secondary | ICD-10-CM | POA: Diagnosis present

## 2022-08-12 DIAGNOSIS — Z841 Family history of disorders of kidney and ureter: Secondary | ICD-10-CM

## 2022-08-12 DIAGNOSIS — I120 Hypertensive chronic kidney disease with stage 5 chronic kidney disease or end stage renal disease: Secondary | ICD-10-CM | POA: Diagnosis not present

## 2022-08-12 DIAGNOSIS — E876 Hypokalemia: Secondary | ICD-10-CM | POA: Diagnosis present

## 2022-08-12 DIAGNOSIS — E871 Hypo-osmolality and hyponatremia: Secondary | ICD-10-CM | POA: Diagnosis present

## 2022-08-12 DIAGNOSIS — D62 Acute posthemorrhagic anemia: Secondary | ICD-10-CM | POA: Diagnosis present

## 2022-08-12 DIAGNOSIS — Z85038 Personal history of other malignant neoplasm of large intestine: Secondary | ICD-10-CM | POA: Diagnosis present

## 2022-08-12 DIAGNOSIS — Z8249 Family history of ischemic heart disease and other diseases of the circulatory system: Secondary | ICD-10-CM | POA: Diagnosis not present

## 2022-08-12 DIAGNOSIS — I712 Thoracic aortic aneurysm, without rupture, unspecified: Secondary | ICD-10-CM | POA: Diagnosis present

## 2022-08-12 DIAGNOSIS — Z9049 Acquired absence of other specified parts of digestive tract: Secondary | ICD-10-CM | POA: Diagnosis not present

## 2022-08-12 DIAGNOSIS — K922 Gastrointestinal hemorrhage, unspecified: Secondary | ICD-10-CM | POA: Diagnosis not present

## 2022-08-12 DIAGNOSIS — D631 Anemia in chronic kidney disease: Secondary | ICD-10-CM | POA: Diagnosis present

## 2022-08-12 DIAGNOSIS — K31819 Angiodysplasia of stomach and duodenum without bleeding: Secondary | ICD-10-CM | POA: Diagnosis present

## 2022-08-12 DIAGNOSIS — I10 Essential (primary) hypertension: Secondary | ICD-10-CM | POA: Diagnosis present

## 2022-08-12 DIAGNOSIS — K921 Melena: Secondary | ICD-10-CM | POA: Diagnosis not present

## 2022-08-12 DIAGNOSIS — I5042 Chronic combined systolic (congestive) and diastolic (congestive) heart failure: Secondary | ICD-10-CM | POA: Diagnosis present

## 2022-08-12 DIAGNOSIS — J45909 Unspecified asthma, uncomplicated: Secondary | ICD-10-CM | POA: Diagnosis not present

## 2022-08-12 LAB — COMPREHENSIVE METABOLIC PANEL
ALT: 18 U/L (ref 0–44)
AST: 30 U/L (ref 15–41)
Albumin: 2.9 g/dL — ABNORMAL LOW (ref 3.5–5.0)
Alkaline Phosphatase: 110 U/L (ref 38–126)
Anion gap: 13 (ref 5–15)
BUN: 35 mg/dL — ABNORMAL HIGH (ref 8–23)
CO2: 29 mmol/L (ref 22–32)
Calcium: 7.9 mg/dL — ABNORMAL LOW (ref 8.9–10.3)
Chloride: 94 mmol/L — ABNORMAL LOW (ref 98–111)
Creatinine, Ser: 4.27 mg/dL — ABNORMAL HIGH (ref 0.61–1.24)
GFR, Estimated: 15 mL/min — ABNORMAL LOW (ref >60)
Glucose, Bld: 134 mg/dL — ABNORMAL HIGH (ref 70–99)
Potassium: 3.3 mmol/L — ABNORMAL LOW (ref 3.5–5.1)
Sodium: 136 mmol/L (ref 135–145)
Total Bilirubin: 0.4 mg/dL (ref 0.3–1.2)
Total Protein: 6.8 g/dL (ref 6.5–8.1)

## 2022-08-12 LAB — I-STAT CHEM 8, ED
BUN: 39 mg/dL — ABNORMAL HIGH (ref 8–23)
Calcium, Ion: 0.95 mmol/L — ABNORMAL LOW (ref 1.15–1.40)
Chloride: 89 mmol/L — ABNORMAL LOW (ref 98–111)
Creatinine, Ser: 4.5 mg/dL — ABNORMAL HIGH (ref 0.61–1.24)
Glucose, Bld: 143 mg/dL — ABNORMAL HIGH (ref 70–99)
HCT: 17 % — ABNORMAL LOW (ref 39.0–52.0)
Hemoglobin: 5.8 g/dL — CL (ref 13.0–17.0)
Potassium: 3.3 mmol/L — ABNORMAL LOW (ref 3.5–5.1)
Sodium: 135 mmol/L (ref 135–145)
TCO2: 32 mmol/L (ref 22–32)

## 2022-08-12 LAB — CBC WITH DIFFERENTIAL/PLATELET
Abs Immature Granulocytes: 0.03 10*3/uL (ref 0.00–0.07)
Basophils Absolute: 0 10*3/uL (ref 0.0–0.1)
Basophils Relative: 0 %
Eosinophils Absolute: 0.1 10*3/uL (ref 0.0–0.5)
Eosinophils Relative: 1 %
HCT: 16.1 % — ABNORMAL LOW (ref 39.0–52.0)
Hemoglobin: 4.9 g/dL — CL (ref 13.0–17.0)
Immature Granulocytes: 1 %
Lymphocytes Relative: 8 %
Lymphs Abs: 0.5 10*3/uL — ABNORMAL LOW (ref 0.7–4.0)
MCH: 30.4 pg (ref 26.0–34.0)
MCHC: 30.4 g/dL (ref 30.0–36.0)
MCV: 100 fL (ref 80.0–100.0)
Monocytes Absolute: 0.8 10*3/uL (ref 0.1–1.0)
Monocytes Relative: 14 %
Neutro Abs: 4.7 10*3/uL (ref 1.7–7.7)
Neutrophils Relative %: 76 %
Platelets: 278 10*3/uL (ref 150–400)
RBC: 1.61 MIL/uL — ABNORMAL LOW (ref 4.22–5.81)
RDW: 19.2 % — ABNORMAL HIGH (ref 11.5–15.5)
WBC: 6.1 10*3/uL (ref 4.0–10.5)
nRBC: 0 % (ref 0.0–0.2)

## 2022-08-12 LAB — VITAMIN B12: Vitamin B-12: 1076 pg/mL — ABNORMAL HIGH (ref 180–914)

## 2022-08-12 LAB — RETICULOCYTES
Immature Retic Fract: 32.7 % — ABNORMAL HIGH (ref 2.3–15.9)
RBC.: 1.58 MIL/uL — ABNORMAL LOW (ref 4.22–5.81)
Retic Count, Absolute: 94.8 10*3/uL (ref 19.0–186.0)
Retic Ct Pct: 6 % — ABNORMAL HIGH (ref 0.4–3.1)

## 2022-08-12 LAB — IRON AND TIBC
Iron: 87 ug/dL (ref 45–182)
Saturation Ratios: 25 % (ref 17.9–39.5)
TIBC: 347 ug/dL (ref 250–450)
UIBC: 260 ug/dL

## 2022-08-12 LAB — PREPARE RBC (CROSSMATCH)

## 2022-08-12 LAB — FOLATE: Folate: 12.4 ng/mL (ref >5.9)

## 2022-08-12 LAB — FERRITIN: Ferritin: 285 ng/mL (ref 24–336)

## 2022-08-12 LAB — ETHANOL: Alcohol, Ethyl (B): <10 mg/dL (ref <10)

## 2022-08-12 LAB — AMMONIA: Ammonia: 33 umol/L (ref 9–35)

## 2022-08-12 MED ORDER — SEVELAMER CARBONATE 800 MG PO TABS
1600.0000 mg | ORAL_TABLET | Freq: Three times a day (TID) | ORAL | Status: DC
  Administered 2022-08-13 – 2022-08-14 (×4): 1600 mg via ORAL
  Filled 2022-08-12 (×4): qty 2

## 2022-08-12 MED ORDER — DULOXETINE HCL 20 MG PO CPEP
20.0000 mg | ORAL_CAPSULE | Freq: Every day | ORAL | Status: DC
  Administered 2022-08-12 – 2022-08-14 (×3): 20 mg via ORAL
  Filled 2022-08-12 (×4): qty 1

## 2022-08-12 MED ORDER — SODIUM CHLORIDE 0.9 % IV SOLN: 10.0000 mL/h | Freq: Once | INTRAVENOUS | Status: DC

## 2022-08-12 MED ORDER — OXYCODONE HCL 5 MG PO TABS
5.0000 mg | ORAL_TABLET | Freq: Three times a day (TID) | ORAL | Status: DC | PRN
  Administered 2022-08-12 – 2022-08-14 (×3): 5 mg via ORAL
  Filled 2022-08-12 (×3): qty 1

## 2022-08-12 MED ORDER — PANTOPRAZOLE 80MG IVPB - SIMPLE MED
80.0000 mg | Freq: Once | INTRAVENOUS | Status: AC
  Administered 2022-08-12: 80 mg via INTRAVENOUS
  Filled 2022-08-12: qty 100

## 2022-08-12 MED ORDER — CARVEDILOL 3.125 MG PO TABS
6.2500 mg | ORAL_TABLET | Freq: Two times a day (BID) | ORAL | Status: DC
  Administered 2022-08-13 – 2022-08-14 (×3): 6.25 mg via ORAL
  Filled 2022-08-12 (×3): qty 2

## 2022-08-12 MED ORDER — DILTIAZEM HCL ER COATED BEADS 360 MG PO CP24
360.0000 mg | ORAL_CAPSULE | Freq: Every day | ORAL | Status: DC
  Administered 2022-08-13 – 2022-08-14 (×3): 360 mg via ORAL
  Filled 2022-08-12 (×4): qty 1

## 2022-08-12 MED ORDER — PANTOPRAZOLE SODIUM 40 MG IV SOLR
40.0000 mg | Freq: Two times a day (BID) | INTRAVENOUS | Status: DC
  Administered 2022-08-12 – 2022-08-13 (×3): 40 mg via INTRAVENOUS
  Filled 2022-08-12 (×3): qty 10

## 2022-08-12 MED ORDER — ALBUTEROL SULFATE (2.5 MG/3ML) 0.083% IN NEBU: 2.5000 mg | INHALATION_SOLUTION | Freq: Four times a day (QID) | RESPIRATORY_TRACT | Status: DC | PRN

## 2022-08-12 NOTE — ED Triage Notes (Signed)
Patient sent here by HD RN for blood transfusion and feeling weak. Patient states the RN told him his hgb was 8 something. Patient alert and oriented. Last complete dialysis session with this morning. HD on Tues/ Thurs/ Sat. Fistula to the left arm.

## 2022-08-12 NOTE — ED Provider Notes (Signed)
[facility] EMERGENCY DEPARTMENT Provider Note   CSN: [id number] Arrival date & time: 08/12/22  1344     History  Chief Complaint  Patient presents with   Weakness   possible low hemoglobin     Timothy [name] is a 61 y.o. male.  HPI    61 yo male with the past medical history of upper GI bleed s/p embolization 11/2021 for duodenal ulcer comes in with chief complaint of severe anemia.  Patient also has ESRD on HD, heart failure, colon cancer sp colectomy, hypertension, Hep C and polysubstance abuse and recurrent anemia requiring transfusion.  Patient came to the hospital from dialysis because his hemoglobin was severely low.  Patient states that he has generalized weakness and malaise.  He was seen in the ER within the last week.  His hemoglobin was found to be low, he left AMA however midway through his transfusion.  Patient denies any melena, dark stools and like last time, declines digital rectal exam.  Home Medications Prior to Admission medications   Medication Sig Start Date End Date Taking? Authorizing Provider  albuterol (VENTOLIN HFA) 108 (90 Base) MCG/ACT inhaler Inhale 2 puffs into the lungs every 6 (six) hours as needed for wheezing or shortness of breath. 01/06/22   [provider]  carvedilol (COREG) 6.25 MG tablet Take 1 tablet (6.25 mg total) by mouth 2 (two) times daily with a meal. 08/02/22 09/01/22  Arrien, Jimmy Picket, MD  diltiazem (CARDIZEM CD) 360 MG 24 hr capsule Take 1 capsule (360 mg total) by mouth daily. 08/03/22 09/02/22  Arrien, Jimmy Picket, MD  DULoxetine (CYMBALTA) 20 MG capsule Take 20 mg by mouth daily. Patient not taking: Reported on 07/03/2022 01/06/22   [provider]  hydrALAZINE (APRESOLINE) 50 MG tablet Take 1 tablet (50 mg total) by mouth 2 (two) times daily. 08/02/22 09/01/22  Arrien, Jimmy Picket, MD  lidocaine (LMX) 4 % cream Apply 1 Application topically 3 (three) times a week. 06/27/22    [provider]  LIDOCAINE-PRILOCAINE EX Apply 1 application  topically Every Tuesday,Thursday,and Saturday with dialysis.    [provider]  pantoprazole (PROTONIX) 40 MG tablet Take 1 tablet (40 mg total) by mouth 2 (two) times daily. 08/02/22 09/01/22  Arrien, Jimmy Picket, MD  sevelamer carbonate (RENVELA) 800 MG tablet Take 2 tablets (1,600 mg total) by mouth 3 (three) times daily with meals. 08/02/22 09/01/22  Arrien, Jimmy Picket, MD      Allergies    Patient has no known allergies.    Review of Systems   Review of Systems  All other systems reviewed and are negative.   Physical Exam Updated Vital Signs BP (!) 159/83   Pulse 76   Temp 98.5 F (36.9 C) (Oral)   Resp 19   SpO2 98%  Physical Exam Vitals and nursing note reviewed.  Constitutional:      Appearance: He is well-developed.  HENT:     Head: Atraumatic.  Cardiovascular:     Rate and Rhythm: Normal rate.  Pulmonary:     Effort: Pulmonary effort is normal.  Musculoskeletal:     Cervical back: Neck supple.  Skin:    General: Skin is warm.  Neurological:     Mental Status: He is alert and oriented to person, place, and time.     ED Results / Procedures / Treatments   Labs (all labs ordered are listed, but only abnormal results are displayed) Labs Reviewed  CBC WITH DIFFERENTIAL/PLATELET - Abnormal; Notable  for the following components:      Result Value   RBC 1.61 (*)    Hemoglobin 4.9 (*)    HCT 16.1 (*)    RDW 19.2 (*)    Lymphs Abs 0.5 (*)    All other components within normal limits  COMPREHENSIVE METABOLIC PANEL - Abnormal; Notable for the following components:   Potassium 3.3 (*)    Chloride 94 (*)    Glucose, Bld 134 (*)    BUN 35 (*)    Creatinine, Ser 4.27 (*)    Calcium 7.9 (*)    Albumin 2.9 (*)    GFR, Estimated 15 (*)    All other components within normal limits  VITAMIN B12 - Abnormal; Notable for the following components:   Vitamin B-12 1,076 (*)    All  other components within normal limits  RETICULOCYTES - Abnormal; Notable for the following components:   Retic Ct Pct 6.0 (*)    RBC. 1.58 (*)    Immature Retic Fract 32.7 (*)    All other components within normal limits  I-STAT CHEM 8, ED - Abnormal; Notable for the following components:   Potassium 3.3 (*)    Chloride 89 (*)    BUN 39 (*)    Creatinine, Ser 4.50 (*)    Glucose, Bld 143 (*)    Calcium, Ion 0.95 (*)    Hemoglobin 5.8 (*)    HCT 17.0 (*)    All other components within normal limits  AMMONIA  ETHANOL  FOLATE  IRON AND TIBC  FERRITIN  TYPE AND SCREEN  PREPARE RBC (CROSSMATCH)    EKG None  Radiology CT Head Wo Contrast  Result Date: 08/12/2022 CLINICAL DATA:  Altered mental status EXAM: CT HEAD WITHOUT CONTRAST TECHNIQUE: Contiguous axial images were obtained from the base of the skull through the vertex without intravenous contrast. RADIATION DOSE REDUCTION: This exam was performed according to the departmental dose-optimization program which includes automated exposure control, adjustment of the mA and/or kV according to patient size and/or use of iterative reconstruction technique. COMPARISON:  04/06/2022 FINDINGS: Brain: No acute intracranial findings are seen. There are no signs of bleeding within the cranium. Ventricles are not dilated. Cortical sulci are prominent. Vascular: Unremarkable. Skull: No fracture is seen in calvarium. Sinuses/Orbits: Small osteoma is seen in right ethmoid sinus. There is mild mucosal thickening in ethmoid and sphenoid sinuses. Other: None. IMPRESSION: No acute intracranial findings are seen in noncontrast CT brain. Electronically Signed   By: Elmer Picker M.D.   On: 08/12/2022 16:11   DG Chest Port 1 View  Result Date: 08/12/2022 CLINICAL DATA:  Weakness.  Abnormal lung sounds. EXAM: PORTABLE CHEST 1 VIEW COMPARISON:  08/02/2022 FINDINGS: 1543 hours. Low volume film. Asymmetric elevation right hemidiaphragm, as before.  Subsegmental atelectasis or linear scarring again noted right base. The cardio pericardial silhouette is enlarged. There is pulmonary vascular congestion without overt pulmonary edema. Telemetry leads overlie the chest. IMPRESSION: Low volume film with cardiomegaly and vascular congestion. Electronically Signed   By: Misty Stanley M.D.   On: 08/12/2022 16:01    Procedures .Critical Care  Performed by: Varney Biles, MD Authorized by: Varney Biles, MD   Critical care provider statement:    Critical care time (minutes):  46   Critical care was necessary to treat or prevent imminent or life-threatening deterioration of the following conditions:  Circulatory failure and renal failure   Critical care was time spent personally by me on the following activities:  Development of treatment plan with patient or surrogate, discussions with consultants, evaluation of patient's response to treatment, examination of patient, ordering and review of laboratory studies, ordering and review of radiographic studies, ordering and performing treatments and interventions, pulse oximetry, re-evaluation of patient's condition and review of old charts     Medications Ordered in ED Medications  0.9 %  sodium chloride infusion (0 mL/hr Intravenous Hold 08/12/22 1623)  pantoprazole (PROTONIX) 80 mg /NS 100 mL IVPB (0 mg Intravenous Stopped 08/12/22 1934)    ED Course/ Medical Decision Making/ A&P                           Medical Decision Making Amount and/or Complexity of Data Reviewed Labs: ordered.  Risk Prescription drug management. Decision regarding hospitalization.   This patient presents to the ED with chief complaint(s) of severe anemia, malaise and dizziness with exertion with pertinent past medical history of ESRD on HD, upper GI bleed.The complaint involves an extensive differential diagnosis and also carries with it a high risk of complications and morbidity.    Patient has symptomatic  anemia.  Differential diagnosis includes anemia of chronic disease, anemia secondary to chronic blood loss from GI source, anemia due to renal failure.  Basic labs ordered.  Patient's hemoglobin is noted to be 5.2.  We will order 2 units of blood.  Last time patient had left AMA, this time he is willing to stay. Patient has declined rectal exam.  He denies melena.  IV Protonix given.  I reviewed patient's endoscopy results from earlier this month.  He had blood in his duodenum, but there was no active bleeding site noted at that time.  I have reconsulted GI, secure chat message has been sent to Dr. Hilarie Fredrickson.   Additional history obtained: Records reviewed previous admission documents  Independent labs interpretation:  The following labs were independently interpreted: Hemoglobin of 4.9.  Treatment and Reassessment: Patient consented to blood.  He had left AMA in the past.  He was not sure if he wanted to stay.  Now he is receiving blood and has agreed to stay in the hospital for full work-up for his anemia.  Consultation: - Consulted or discussed management/test interpretation with external professional: GI service has been consulted.  Final Clinical Impression(s) / ED Diagnoses Final diagnoses:  Severe anemia  Symptomatic anemia    Rx / DC Orders ED Discharge Orders     None         Varney Biles, MD 08/12/22 2021

## 2022-08-12 NOTE — ED Provider Triage Note (Cosign Needed Addendum)
Emergency Medicine Provider Triage Evaluation Note  Timothy [name] , a 61 y.o. male  was evaluated in triage.  Pt complains of weakness. Left arm access for dialysis, t/th/sat, had full session today, sent to ER for hgb 8, does not know baseline. Recently here with transfusion for GI bleed.  Review of Systems  Positive:  Negative:   Physical Exam  BP 137/76 (BP Location: Right Arm)   Pulse 81   Temp 98.6 F (37 C)   Resp 18   SpO2 96%  Gen:   lethargic, no distress   Resp:  Normal effort, rales lower lung fields MSK:   Moves extremities without difficulty  Other:    Medical Decision Making  Medically screening exam initiated at 3:21 PM.  Appropriate orders placed.  Timothy [name] was informed that the remainder of the evaluation will be completed by another provider, this initial triage assessment does not replace that evaluation, and the importance of remaining in the ED until their evaluation is complete.     Tacy Learn, PA-C 08/12/22 1455    Tacy Learn, PA-C 08/12/22 1521

## 2022-08-12 NOTE — H&P (Signed)
History and Physical    Timothy [name] LFY:[id number] DOB: 1960-12-12 DOA: 08/12/2022  PCP: [name], MD  Patient coming from: dialysis  I have personally briefly reviewed patient's old medical records in [place]  Chief Complaint: low hgb of 4.9 note outpatient HD unit labs  HPI: Timothy [name] is a 61 y.o. male with medical history significant of  medical history of upper GI bleed sp embolization 11/2021, for duodenal ulcer. He also has ESRD on HD TTS, heart failure, colon cancer sp colectomy, hypertension, Hep C and polysubstance abuse notes etoh in remission, who has interim history of admission for upper gi bleed 10/11-/10/18 . AT that time endoscope noted blood in duodenum but no source was identified. Patient was transfused 4 unit PRBC with good response. At that time consult was placed to IR who noted patient was not a candidate for repeat embolization due no noted vessels to target. AT that time medical management was recommended.  Patient was discharged  with hbg of 7.1, on ppi and told to avoid NSAIDS.  Patient was also seen in ED 10/21 again with abn hgb on routine labs and at that time patient denied any rectal bleeding or black stools. He was transfused one unit of blood s/p which he signed out AMA. Patient now returns to ED in referral from HD center after he was again found to have low hgb.  Patient currently notes mild sob, black stools but no chest pain, n/v/d/abdominal pain. He denies brbpr or nose bleeds. OF note he refuses rectal exam in ED. He is currently amendable to being admitted for GI evaluation and treatment of symptomatic anemia.   ED Course:  In ED vitals  afeb, bp 137/76, hr 81, rr 18, sat 96% on ra  ENI:DPOEU rhythm Labs: Wbc 6.1, hgb 4.9 ( 6-,7.1,-7.3),plt 278 NA  136, K 3.3, cr 4.27( at baseline),  Retic 6,  Iron 87, tibc 347,folate 12.4, b12 1,076 ETOH <10, ammonia 33 CTH: NAD Tx 2 units PRBC in ED Review of Systems: As per HPI  otherwise 10 point review of systems negative.   Past Medical History:  Diagnosis Date   Acute metabolic encephalopathy 23/53/6144   Anemia of chronic kidney failure    BPH (benign prostatic hyperplasia)    Colon cancer [facility]) 2014   spouse states he had surgury for colon CA   End stage renal disease on dialysis [facility]) 2017   TTHSat   Hepatitis C    treated   Hypertension    Hypertensive crisis 04/07/2022   Hypertensive heart disease with chronic diastolic congestive heart failure ([place]) 07/17/2016   Polysubstance abuse ([place])    History of heroin and marijuana use   Thoracic aortic aneurysm ([place])    followed by Dr. Ellyn Hack    Past Surgical History:  Procedure Laterality Date   A/V FISTULAGRAM Left 12/02/2021   Procedure: A/V Fistulagram;  Surgeon: Cherre Robins, MD;  Location: [place] CV LAB;  Service: Cardiovascular;  Laterality: Left;   A/V FISTULAGRAM Left 04/24/2022   Procedure: A/V Fistulagram;  Surgeon: Waynetta Sandy, MD;  Location: [place] CV LAB;  Service: Cardiovascular;  Laterality: Left;   ABDOMINAL SURGERY     AV FISTULA PLACEMENT Left 09/19/2016   Procedure: Left arm Radiocephalic ARTERIOVENOUS (AV) FISTULA CREATION;  Surgeon: Conrad Anderson, MD;  Location: [place];  Service: Vascular;  Laterality: Left;   BASCILIC VEIN TRANSPOSITION Left 07/09/2017   Procedure: BRACHIOCEPHALIC FISTULA CREATION;  Surgeon: Conrad DeLand, MD;  Location: [place] OR;  Service: Vascular;  Laterality: Left;   BIOPSY  07/05/2022   Procedure: BIOPSY;  Surgeon: Thornton Park, MD;  Location: [place];  Service: Gastroenterology;;   COLON SURGERY  2014   COLONOSCOPY N/A 07/12/2022   Procedure: COLONOSCOPY;  Surgeon: Doran Stabler, MD;  Location: [place];  Service: Gastroenterology;  Laterality: N/A;   COLONOSCOPY WITH PROPOFOL N/A 07/05/2022   Procedure: COLONOSCOPY WITH PROPOFOL;  Surgeon: Thornton Park, MD;  Location: [place];  Service: Gastroenterology;   Laterality: N/A;   ENTEROSCOPY N/A 07/28/2022   Procedure: ENTEROSCOPY;  Surgeon: [name], MD;  Location: [facility] ENDOSCOPY;  Service: Gastroenterology;  Laterality: N/A;   ESOPHAGOGASTRODUODENOSCOPY (EGD) WITH PROPOFOL N/A 11/05/2021   Procedure: ESOPHAGOGASTRODUODENOSCOPY (EGD) WITH PROPOFOL;  Surgeon: Carol Ada, MD;  Location: [place];  Service: Endoscopy;  Laterality: N/A;   ESOPHAGOGASTRODUODENOSCOPY (EGD) WITH PROPOFOL N/A 11/14/2021   Procedure: ESOPHAGOGASTRODUODENOSCOPY (EGD) WITH PROPOFOL;  Surgeon: [name], MD;  Location: [place];  Service: Gastroenterology;  Laterality: N/A;   ESOPHAGOGASTRODUODENOSCOPY (EGD) WITH PROPOFOL N/A 11/16/2021   Procedure: ESOPHAGOGASTRODUODENOSCOPY (EGD) WITH PROPOFOL;  Surgeon: [name], MD;  Location: [name];  Service: Gastroenterology;  Laterality: N/A;   ESOPHAGOGASTRODUODENOSCOPY (EGD) WITH PROPOFOL N/A 07/05/2022   Procedure: ESOPHAGOGASTRODUODENOSCOPY (EGD) WITH PROPOFOL;  Surgeon: Thornton Park, MD;  Location: [place];  Service: Gastroenterology;  Laterality: N/A;   ESOPHAGOGASTRODUODENOSCOPY (EGD) WITH PROPOFOL N/A 07/12/2022   Procedure: ESOPHAGOGASTRODUODENOSCOPY (EGD) WITH PROPOFOL;  Surgeon: Doran Stabler, MD;  Location: [place];  Service: Gastroenterology;  Laterality: N/A;   ESOPHAGOGASTRODUODENOSCOPY (EGD) WITH PROPOFOL N/A 07/31/2022   Procedure: ESOPHAGOGASTRODUODENOSCOPY (EGD) WITH PROPOFOL;  Surgeon: Ladene Artist, MD;  Location: [place];  Service: Gastroenterology;  Laterality: N/A;   HEMOSTASIS CLIP PLACEMENT  11/16/2021   Procedure: HEMOSTASIS CLIP PLACEMENT;  Surgeon: [name], MD;  Location: [facility] ENDOSCOPY;  Service: Gastroenterology;;   HEMOSTASIS CLIP PLACEMENT  07/05/2022   Procedure: HEMOSTASIS CLIP PLACEMENT;  Surgeon: Thornton Park, MD;  Location: [facility] ENDOSCOPY;  Service: Gastroenterology;;   HEMOSTASIS CONTROL  11/05/2021   Procedure: HEMOSTASIS CONTROL;  Surgeon:  Carol Ada, MD;  Location: [place];  Service: Endoscopy;;   HEMOSTASIS CONTROL  11/16/2021   Procedure: HEMOSTASIS CONTROL;  Surgeon: [name], MD;  Location: [place];  Service: Gastroenterology;;   HEMOSTASIS CONTROL  07/05/2022   Procedure: HEMOSTASIS CONTROL;  Surgeon: Thornton Park, MD;  Location: [place];  Service: Gastroenterology;;   HOT HEMOSTASIS N/A 11/16/2021   Procedure: HOT HEMOSTASIS (ARGON PLASMA COAGULATION/BICAP);  Surgeon: [name], MD;  Location: [place];  Service: Gastroenterology;  Laterality: N/A;   INSERTION OF DIALYSIS CATHETER N/A 09/19/2016   Procedure: INSERTION OF TUNNELED DIALYSIS CATHETER;  Surgeon: Conrad , MD;  Location: [place];  Service: Vascular;  Laterality: N/A;   IR ANGIOGRAM SELECTIVE EACH ADDITIONAL VESSEL  11/16/2021   IR ANGIOGRAM VISCERAL SELECTIVE  11/05/2021   IR ANGIOGRAM VISCERAL SELECTIVE  11/16/2021   IR ANGIOGRAM VISCERAL SELECTIVE  11/16/2021   IR EMBO ART  VEN HEMORR LYMPH EXTRAV  INC GUIDE ROADMAPPING  11/05/2021   IR EMBO ART  VEN HEMORR LYMPH EXTRAV  INC GUIDE ROADMAPPING  11/16/2021   IR GENERIC HISTORICAL  09/14/2016   IR US GUIDE VASC ACCESS RIGHT 09/14/2016 [name], DO [facility]   IR GENERIC HISTORICAL  09/14/2016   IR FLUORO GUIDE CV LINE RIGHT 09/14/2016 [name], DO [facility]   IR PARACENTESIS  06/22/2021  IR US GUIDE VASC ACCESS RIGHT  11/05/2021   IR US GUIDE [place] RIGHT  11/16/2021   LAPAROTOMY N/A 05/23/2021   Procedure: EXPLORATORY LAPAROTOMY LYSIS ADHESIONS;  Surgeon: [name], MD;  Location: [place];  Service: General;  Laterality: N/A;   LAPAROTOMY N/A 11/20/2021   Procedure: EXPLORATORY LAPAROTOMY, REPAIR OF BLEEDING DUODENAL ULCER;  Surgeon: Dwan Bolt, MD;  Location: [place];  Service: General;  Laterality: N/A;   ORIF TIBIA PLATEAU Left 01/15/2018   Procedure: OPEN REDUCTION INTERNAL FIXATION (ORIF) TIBIAL PLATEAU;  Surgeon: Altamese Pipestone, MD;  Location: [place];   Service: Orthopedics;  Laterality: Left;   REVISON OF ARTERIOVENOUS FISTULA Left 05/26/2022   Procedure: REVISION OF LEFT ARM ARTERIOVENOUS FISTULA WITH PLICATION OF PSEUDOANEURYSM;  Surgeon: Waynetta Sandy, MD;  Location: [place];  Service: Vascular;  Laterality: Left;   SCLEROTHERAPY  11/05/2021   Procedure: Clide Deutscher;  Surgeon: Carol Ada, MD;  Location: [place];  Service: Endoscopy;;   SCLEROTHERAPY  11/16/2021   Procedure: Clide Deutscher;  Surgeon: [name], MD;  Location: [facility] ENDOSCOPY;  Service: Gastroenterology;;   TRANSTHORACIC ECHOCARDIOGRAM  07/2016    EF 60-65%, No RWMA. Mod Concentric LVH - Gr 2 DD. Severe LA dilation. PAP ~35 mmHg (mild Pulm HTN)  --> no changes noted 1 month later     reports that he quit smoking about 3 months ago. His smoking use included cigarettes. He smoked an average of .25 packs per day. He has never been exposed to tobacco smoke. He has never used smokeless tobacco. He reports current drug use. Frequency: 1.00 time per week. Drugs: Marijuana and Heroin. He reports that he does not drink alcohol.  No Known Allergies  Family History  Problem Relation Age of Onset   Heart failure Mother        Died at age 7.   Heart attack Mother 73   Hypertension Mother    Diabetes Mellitus II Mother    Other Father        Unknown   Kidney failure Sister        (Oldest Sister)   Other Other        Multiple siblings have started her heart disease, he is not sure of the details.   CAD Nephew    Prior to Admission medications   Medication Sig Start Date End Date Taking? Authorizing Provider  albuterol (VENTOLIN HFA) 108 (90 Base) MCG/ACT inhaler Inhale 2 puffs into the lungs every 6 (six) hours as needed for wheezing or shortness of breath. 01/06/22   [provider]  carvedilol (COREG) 6.25 MG tablet Take 1 tablet (6.25 mg total) by mouth 2 (two) times daily with a meal. 08/02/22 09/01/22  Arrien, Jimmy Picket, MD  diltiazem  (CARDIZEM CD) 360 MG 24 hr capsule Take 1 capsule (360 mg total) by mouth daily. 08/03/22 09/02/22  Arrien, Jimmy Picket, MD  DULoxetine (CYMBALTA) 20 MG capsule Take 20 mg by mouth daily. Patient not taking: Reported on 07/03/2022 01/06/22   [provider]  hydrALAZINE (APRESOLINE) 50 MG tablet Take 1 tablet (50 mg total) by mouth 2 (two) times daily. 08/02/22 09/01/22  Arrien, Jimmy Picket, MD  lidocaine (LMX) 4 % cream Apply 1 Application topically 3 (three) times a week. 06/27/22   [provider]  LIDOCAINE-PRILOCAINE EX Apply 1 application  topically Every Tuesday,Thursday,and Saturday with dialysis.    [provider]  pantoprazole (PROTONIX) 40 MG tablet Take 1 tablet (40 mg total) by mouth 2 (  two) times daily. 08/02/22 09/01/22  Arrien, Jimmy Picket, MD  sevelamer carbonate (RENVELA) 800 MG tablet Take 2 tablets (1,600 mg total) by mouth 3 (three) times daily with meals. 08/02/22 09/01/22  Tawni Millers, MD    Physical Exam: Vitals:   08/12/22 1830 08/12/22 1900 08/12/22 1930 08/12/22 2000  BP: (!) 162/92 (!) 176/98 (!) 173/162 (!) 159/83  Pulse: 76 82 77 76  Resp: '15 16 19 19  '$ Temp:      TempSrc:      SpO2: 96% 98% 97% 98%    Constitutional: NAD, calm, comfortable Vitals:   08/12/22 1830 08/12/22 1900 08/12/22 1930 08/12/22 2000  BP: (!) 162/92 (!) 176/98 (!) 173/162 (!) 159/83  Pulse: 76 82 77 76  Resp: '15 16 19 19  '$ Temp:      TempSrc:      SpO2: 96% 98% 97% 98%   Eyes: PERRL, lids and conjunctivae normal ENMT: Mucous membranes are moist. Posterior pharynx clear of any exudate or lesions.Normal dentition.  Neck: normal, supple, no masses, no thyromegaly Respiratory: clear to auscultation bilaterally,occasional rhonchi -clears with coughing, no wheezing, no crackles. Normal respiratory effort. No accessory muscle use.  Cardiovascular: Regular rate and rhythm, no murmurs / rubs / gallops. Trace extremity edema. Warm extremities.   Abdomen: no tenderness, no masses palpated. No hepatosplenomegaly. Bowel sounds positive.  Musculoskeletal: no clubbing / cyanosis. No joint deformity upper and lower extremities. Good ROM, no contractures. Normal muscle tone.  Skin: no rashes, lesions, ulcers. No induration Neurologic: CN 2-12 grossly intact. Sensation intact, Strength 5/5 in all 4.  Psychiatric: Normal judgment and insight. Alert and oriented x 3. Normal mood.    Labs on Admission: I have personally reviewed following labs and imaging studies  CBC: Recent Labs  Lab 08/12/22 1501 08/12/22 1530  WBC 6.1  --   NEUTROABS 4.7  --   HGB 4.9* 5.8*  HCT 16.1* 17.0*  MCV 100.0  --   PLT 278  --    Basic Metabolic Panel: Recent Labs  Lab 08/12/22 1501 08/12/22 1530  NA 136 135  K 3.3* 3.3*  CL 94* 89*  CO2 29  --   GLUCOSE 134* 143*  BUN 35* 39*  CREATININE 4.27* 4.50*  CALCIUM 7.9*  --    GFR: Estimated Creatinine Clearance: 19.5 mL/min (A) (by C-G formula based on SCr of 4.5 mg/dL (H)). Liver Function Tests: Recent Labs  Lab 08/12/22 1501  AST 30  ALT 18  ALKPHOS 110  BILITOT 0.4  PROT 6.8  ALBUMIN 2.9*   No results for input(s): "LIPASE", "AMYLASE" in the last 168 hours. Recent Labs  Lab 08/12/22 1530  AMMONIA 33   Coagulation Profile: No results for input(s): "INR", "PROTIME" in the last 168 hours. Cardiac Enzymes: No results for input(s): "CKTOTAL", "CKMB", "CKMBINDEX", "TROPONINI" in the last 168 hours. BNP (last 3 results) No results for input(s): "PROBNP" in the last 8760 hours. HbA1C: No results for input(s): "HGBA1C" in the last 72 hours. CBG: No results for input(s): "GLUCAP" in the last 168 hours. Lipid Profile: No results for input(s): "CHOL", "HDL", "LDLCALC", "TRIG", "CHOLHDL", "LDLDIRECT" in the last 72 hours. Thyroid Function Tests: No results for input(s): "TSH", "T4TOTAL", "FREET4", "T3FREE", "THYROIDAB" in the last 72 hours. Anemia Panel: Recent Labs     08/12/22 1501 08/12/22 1630  VITAMINB12  --  1,076*  FOLATE 12.4  --   FERRITIN 285  --   TIBC 347  --   IRON 87  --  RETICCTPCT 6.0*  --    Urine analysis:    Component Value Date/Time   COLORURINE YELLOW 05/19/2021 2015   APPEARANCEUR HAZY (A) 05/19/2021 2015   LABSPEC 1.012 05/19/2021 2015   PHURINE 9.0 (H) 05/19/2021 2015   GLUCOSEU NEGATIVE 05/19/2021 2015   HGBUR NEGATIVE 05/19/2021 2015   BILIRUBINUR NEGATIVE 05/19/2021 2015   KETONESUR NEGATIVE 05/19/2021 2015   PROTEINUR >=300 (A) 05/19/2021 2015   NITRITE NEGATIVE 05/19/2021 2015   LEUKOCYTESUR SMALL (A) 05/19/2021 2015    Radiological Exams on Admission: CT Head Wo Contrast  Result Date: 08/12/2022 CLINICAL DATA:  Altered mental status EXAM: CT HEAD WITHOUT CONTRAST TECHNIQUE: Contiguous axial images were obtained from the base of the skull through the vertex without intravenous contrast. RADIATION DOSE REDUCTION: This exam was performed according to the departmental dose-optimization program which includes automated exposure control, adjustment of the mA and/or kV according to patient size and/or use of iterative reconstruction technique. COMPARISON:  04/06/2022 FINDINGS: Brain: No acute intracranial findings are seen. There are no signs of bleeding within the cranium. Ventricles are not dilated. Cortical sulci are prominent. Vascular: Unremarkable. Skull: No fracture is seen in calvarium. Sinuses/Orbits: Small osteoma is seen in right ethmoid sinus. There is mild mucosal thickening in ethmoid and sphenoid sinuses. Other: None. IMPRESSION: No acute intracranial findings are seen in noncontrast CT brain. Electronically Signed   By: Elmer Picker M.D.   On: 08/12/2022 16:11   DG Chest Port 1 View  Result Date: 08/12/2022 CLINICAL DATA:  Weakness.  Abnormal lung sounds. EXAM: PORTABLE CHEST 1 VIEW COMPARISON:  08/02/2022 FINDINGS: 1543 hours. Low volume film. Asymmetric elevation right hemidiaphragm, as before.  Subsegmental atelectasis or linear scarring again noted right base. The cardio pericardial silhouette is enlarged. There is pulmonary vascular congestion without overt pulmonary edema. Telemetry leads overlie the chest. IMPRESSION: Low volume film with cardiomegaly and vascular congestion. Electronically Signed   By: Misty Stanley M.D.   On: 08/12/2022 16:01    EKG: Independently reviewed.  See above   Assessment/Plan Symptomatic Anemia -multifactorial, hx of PUD with gi bleed, ESRD  -check epo level  -iron stores within normal limits  -repeat hgb post transfusion, transfuse 1 unit if hgb < 7  -of note patient refusing rectal exam , but notes no rectal bleeding or brbpr. But did note black stools.  Hx of Upper gi Bleed with presumed recurrence -due to PUD , recent EGD 10/13 with noted blood but no active bleeding  -ppi bid -transition to oral in am if no bleeding noted and patient has stable h/h  -if patient not responsive to transfusion consider further gi evaluation   ESRD on HD TTS -if patient s/p HD 10/28 -if patient will still be in house on 10/31 will need nephrology consult   Combined systolic diastolic CHF -currently s/p HD and is euvolemic -resume home regimen   Pafib -continue with coreg as bp tolerates  -not on AC As not a candidate due to recurrent gi bleeding  Essential HTN -resume home regimen as able   Hypokalemia -replete prn   DVT prophylaxis:  Scd Code Status: full Family Communication: none at bedside Disposition Plan: patient  expected to be admitted greater than 2 midnights  Consults called: [place]  GI Dr Raquel James Admission status: progressive   Clance Boll MD [facility]   If 7PM-7AM, please contact night-coverage [email or web address] Password [email or web address]  08/12/2022, 8:21 PM

## 2022-08-13 ENCOUNTER — Inpatient Hospital Stay (HOSPITAL_COMMUNITY): Payer: Medicare Other | Admitting: Anesthesiology

## 2022-08-13 ENCOUNTER — Encounter (HOSPITAL_COMMUNITY)
Admission: EM | Disposition: A | Discharge status: Home / Self Care | Payer: Self-pay | Source: Ambulatory Visit | Attending: Internal Medicine

## 2022-08-13 ENCOUNTER — Encounter (HOSPITAL_COMMUNITY): Payer: Self-pay | Admitting: Internal Medicine

## 2022-08-13 DIAGNOSIS — R710 Precipitous drop in hematocrit: Secondary | ICD-10-CM

## 2022-08-13 DIAGNOSIS — N186 End stage renal disease: Secondary | ICD-10-CM

## 2022-08-13 DIAGNOSIS — K31811 Angiodysplasia of stomach and duodenum with bleeding: Secondary | ICD-10-CM

## 2022-08-13 DIAGNOSIS — F17200 Nicotine dependence, unspecified, uncomplicated: Secondary | ICD-10-CM

## 2022-08-13 DIAGNOSIS — I120 Hypertensive chronic kidney disease with stage 5 chronic kidney disease or end stage renal disease: Secondary | ICD-10-CM

## 2022-08-13 DIAGNOSIS — J45909 Unspecified asthma, uncomplicated: Secondary | ICD-10-CM

## 2022-08-13 DIAGNOSIS — K921 Melena: Secondary | ICD-10-CM

## 2022-08-13 DIAGNOSIS — Z992 Dependence on renal dialysis: Secondary | ICD-10-CM

## 2022-08-13 DIAGNOSIS — K2991 Gastroduodenitis, unspecified, with bleeding: Secondary | ICD-10-CM

## 2022-08-13 DIAGNOSIS — K31819 Angiodysplasia of stomach and duodenum without bleeding: Secondary | ICD-10-CM | POA: Diagnosis present

## 2022-08-13 HISTORY — PX: HOT HEMOSTASIS: SHX5433

## 2022-08-13 HISTORY — PX: HEMOSTASIS CLIP PLACEMENT: SHX6857

## 2022-08-13 HISTORY — PX: ESOPHAGOGASTRODUODENOSCOPY (EGD) WITH PROPOFOL: SHX5813

## 2022-08-13 LAB — TROPONIN I (HIGH SENSITIVITY)
Troponin I (High Sensitivity): 108 ng/L (ref <18)
Troponin I (High Sensitivity): 107 ng/L (ref <18)

## 2022-08-13 LAB — CBC
HCT: 19.1 % — ABNORMAL LOW (ref 39.0–52.0)
Hemoglobin: 6.3 g/dL — CL (ref 13.0–17.0)
MCH: 31.2 pg (ref 26.0–34.0)
MCHC: 33 g/dL (ref 30.0–36.0)
MCV: 94.6 fL (ref 80.0–100.0)
Platelets: 256 10*3/uL (ref 150–400)
RBC: 2.02 MIL/uL — ABNORMAL LOW (ref 4.22–5.81)
RDW: 20.5 % — ABNORMAL HIGH (ref 11.5–15.5)
WBC: 6.6 10*3/uL (ref 4.0–10.5)
nRBC: 0 % (ref 0.0–0.2)

## 2022-08-13 LAB — POCT I-STAT, CHEM 8
BUN: 61 mg/dL — ABNORMAL HIGH (ref 8–23)
Calcium, Ion: 0.83 mmol/L — CL (ref 1.15–1.40)
Chloride: 92 mmol/L — ABNORMAL LOW (ref 98–111)
Creatinine, Ser: 5.8 mg/dL — ABNORMAL HIGH (ref 0.61–1.24)
Glucose, Bld: 97 mg/dL (ref 70–99)
HCT: 26 % — ABNORMAL LOW (ref 39.0–52.0)
Hemoglobin: 8.8 g/dL — ABNORMAL LOW (ref 13.0–17.0)
Potassium: 5.5 mmol/L — ABNORMAL HIGH (ref 3.5–5.1)
Sodium: 131 mmol/L — ABNORMAL LOW (ref 135–145)
TCO2: 29 mmol/L (ref 22–32)

## 2022-08-13 LAB — COMPREHENSIVE METABOLIC PANEL
ALT: 18 U/L (ref 0–44)
AST: 27 U/L (ref 15–41)
Albumin: 2.7 g/dL — ABNORMAL LOW (ref 3.5–5.0)
Alkaline Phosphatase: 114 U/L (ref 38–126)
Anion gap: 15 (ref 5–15)
BUN: 43 mg/dL — ABNORMAL HIGH (ref 8–23)
CO2: 28 mmol/L (ref 22–32)
Calcium: 7.9 mg/dL — ABNORMAL LOW (ref 8.9–10.3)
Chloride: 92 mmol/L — ABNORMAL LOW (ref 98–111)
Creatinine, Ser: 4.98 mg/dL — ABNORMAL HIGH (ref 0.61–1.24)
GFR, Estimated: 12 mL/min — ABNORMAL LOW (ref >60)
Glucose, Bld: 85 mg/dL (ref 70–99)
Potassium: 3.6 mmol/L (ref 3.5–5.1)
Sodium: 135 mmol/L (ref 135–145)
Total Bilirubin: 0.5 mg/dL (ref 0.3–1.2)
Total Protein: 6.7 g/dL (ref 6.5–8.1)

## 2022-08-13 LAB — PREPARE RBC (CROSSMATCH)

## 2022-08-13 LAB — HEMOGLOBIN AND HEMATOCRIT, BLOOD
HCT: 20.7 % — ABNORMAL LOW (ref 39.0–52.0)
Hemoglobin: 6.5 g/dL — CL (ref 13.0–17.0)

## 2022-08-13 LAB — CBG MONITORING, ED: Glucose-Capillary: 89 mg/dL (ref 70–99)

## 2022-08-13 SURGERY — ESOPHAGOGASTRODUODENOSCOPY (EGD) WITH PROPOFOL
Anesthesia: Monitor Anesthesia Care

## 2022-08-13 MED ORDER — SODIUM CHLORIDE 0.9% IV SOLUTION: Freq: Once | INTRAVENOUS | Status: DC

## 2022-08-13 MED ORDER — MELATONIN 3 MG PO TABS
3.0000 mg | ORAL_TABLET | Freq: Every evening | ORAL | Status: DC | PRN
  Administered 2022-08-13: 3 mg via ORAL
  Filled 2022-08-13: qty 1

## 2022-08-13 MED ORDER — HYDROMORPHONE HCL 1 MG/ML IJ SOLN
0.5000 mg | Freq: Once | INTRAMUSCULAR | Status: AC | PRN
  Administered 2022-08-13: 0.5 mg via INTRAVENOUS
  Filled 2022-08-13: qty 1

## 2022-08-13 MED ORDER — PROPOFOL 500 MG/50ML IV EMUL
INTRAVENOUS | Status: DC | PRN
  Administered 2022-08-13: 125 ug/kg/min via INTRAVENOUS

## 2022-08-13 MED ORDER — HYDRALAZINE HCL 20 MG/ML IJ SOLN
5.0000 mg | INTRAMUSCULAR | Status: DC | PRN
  Administered 2022-08-13: 5 mg via INTRAVENOUS
  Filled 2022-08-13: qty 1

## 2022-08-13 MED ORDER — SODIUM CHLORIDE 0.9 % IV SOLN
INTRAVENOUS | Status: AC | PRN
  Administered 2022-08-13: 500 mL via INTRAVENOUS

## 2022-08-13 MED ORDER — SODIUM CHLORIDE 0.9 % IV SOLN: INTRAVENOUS | Status: DC | PRN

## 2022-08-13 MED ORDER — NALOXONE HCL 0.4 MG/ML IJ SOLN: 0.4000 mg | INTRAMUSCULAR | Status: DC | PRN

## 2022-08-13 MED ORDER — HYDRALAZINE HCL 25 MG PO TABS
50.0000 mg | ORAL_TABLET | Freq: Two times a day (BID) | ORAL | Status: DC
  Administered 2022-08-13 – 2022-08-14 (×3): 50 mg via ORAL
  Filled 2022-08-13 (×3): qty 2

## 2022-08-13 MED ORDER — CALCIUM CHLORIDE 10 % IV SOLN
INTRAVENOUS | Status: DC | PRN
  Administered 2022-08-13 (×5): 100 mg via INTRAVENOUS

## 2022-08-13 MED ORDER — GLYCOPYRROLATE PF 0.2 MG/ML IJ SOSY
PREFILLED_SYRINGE | INTRAMUSCULAR | Status: DC | PRN
  Administered 2022-08-13: .1 mg via INTRAVENOUS

## 2022-08-13 MED ORDER — LIDOCAINE 2% (20 MG/ML) 5 ML SYRINGE
INTRAMUSCULAR | Status: DC | PRN
  Administered 2022-08-13 (×2): 40 mg via INTRAVENOUS

## 2022-08-13 MED ORDER — SODIUM CHLORIDE 0.9% IV SOLUTION: Freq: Once | INTRAVENOUS | Status: AC

## 2022-08-13 MED ORDER — PROPOFOL 10 MG/ML IV BOLUS
INTRAVENOUS | Status: DC | PRN
  Administered 2022-08-13: 10 mg via INTRAVENOUS

## 2022-08-13 MED ORDER — DEXMEDETOMIDINE HCL IN NACL 200 MCG/50ML IV SOLN
INTRAVENOUS | Status: DC | PRN
  Administered 2022-08-13: 8 ug via INTRAVENOUS
  Administered 2022-08-13: 4 ug via INTRAVENOUS

## 2022-08-13 SURGICAL SUPPLY — 15 items

## 2022-08-13 NOTE — ED Notes (Signed)
Pt noted to be off of all monitoring equipment. This RN went into pt room, found pt A&O4, sitting on the edge of the bed. Pt advised this RN that he is "unable to sleep w all this shit on." Admitting MD advised & request for PRN sleep aid sent.

## 2022-08-13 NOTE — ED Notes (Signed)
The pt wants to go home

## 2022-08-13 NOTE — Assessment & Plan Note (Signed)
Patient with recurrent GI bleed. Hemoglobin at 6.5 after getting 3 unit of PRBC, fourth unit ordered. 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  Also erosive duodenitis. -Continue with twice daily PPI -Monitor hemoglobin -Transfuse if below 7

## 2022-08-13 NOTE — Op Note (Signed)
[facility] Patient Name: Timothy [name] Procedure Date : 08/13/2022 MRN: [id number] Attending MD: Gatha Mayer , MD, [id number] Date of Birth: May 24, 1961 CSN: [id number] Age: 61 Admit Type: Inpatient Procedure:                Upper GI endoscopy Indications:              Melena, Suspected upper gastrointestinal bleeding                            s/p coiling of GDA and then gelfoam embolization of                            pancreaticoduodenal arcades 10/2021 w/ recurrent                            bleeding since - 06/2022 had lesion clipped near                            ampulla.Also colon bleeding on CT-A but not seen -                            EGD has shown coils from prior embolization -                            lesion clipped by papilla and 10/16 had blood in                            duodenum but source not seen. DC after that and was                            not n PPI. now w/ decreased Hgb and melena again. Providers:                Gatha Mayer, MD, Grace Isaac, RN, Fransico Setters                            Mbumina, Technician Referring MD:              Medicines:                Monitored Anesthesia Care Complications:            No immediate complications. Estimated Blood Loss:     Estimated blood loss was minimal. Procedure:                Pre-Anesthesia Assessment:                           - Prior to the procedure, a History and Physical                            was performed, and patient medications and                            allergies were reviewed. The patient's tolerance of  previous anesthesia was also reviewed. The risks                            and benefits of the procedure and the sedation                            options and risks were discussed with the patient.                            All questions were answered, and informed consent                            was obtained. Prior Anticoagulants: The  patient has                            taken no anticoagulant or antiplatelet agents. ASA                            Grade Assessment: III - A patient with severe                            systemic disease. After reviewing the risks and                            benefits, the patient was deemed in satisfactory                            condition to undergo the procedure.                           After obtaining informed consent, the endoscope was                            passed under direct vision. Throughout the                            procedure, the patient's blood pressure, pulse, and                            oxygen saturations were monitored continuously. The                            GIF-H190 ([id number] Olympus endoscope was introduced                            through the mouth, and advanced to the second part                            of duodenum. The upper GI endoscopy was                            accomplished without difficulty. The patient  tolerated the procedure well. Scope In: Scope Out: Findings:      A single 2 to 3 mm angiodysplastic lesion with stigmata of recent       bleeding was found in the second portion of the duodenum. Coagulation       for bleeding prevention using argon plasma was successful. To prevent       bleeding post-intervention, three hemostatic clips were successfully       placed. Clip manufacturer: [employer]. There was no bleeding at       the end of the procedure.      Diffuse moderate inflammation characterized by congestion (edema) and       erythema was found in the second portion of the duodenum. There were       coils seen with slight erosive changes but no stigmata of bleeding      Diffuse mild inflammation characterized by congestion (edema) and       erythema was found in the cardia, in the gastric fundus and in the       gastric body.      The exam was otherwise without abnormality.      The  cardia and gastric fundus were otherwise normal on retroflexion. 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 Treated with argon plasma coagulation                            (APC-right colon setting). It bled during but                            stopped with further application.Clips were placed                            to reduce chance of post APC bleeding though they                            surround vs being directly on as medial wall                            location was difficult to approach and place clips.                            Clip manufacturer: [employer].                           - Duodenitis. Coils from prior GDA embolization                            also seen in D2, associated slight inflammation w/o  bleeding                           - Gastritis.                           - The examination was otherwise normal.                           - No specimens collected. Recommendation:           - return to floor                           Serial Hgb                           bid PPI (chronic)                           renal diet                           will f/u tomorrow Procedure Code(s):        --- Professional ---                           [phone], Esophagogastroduodenoscopy, flexible,                            transoral; with control of bleeding, any method Diagnosis Code(s):        --- Professional ---                           K31.811, Angiodysplasia of stomach and duodenum                            with bleeding                           K29.80, Duodenitis without bleeding                           K29.70, Gastritis, unspecified, without bleeding                           K92.1, Melena (includes Hematochezia) CPT copyright 2022 [employer]. All rights  reserved. The codes documented in this report are preliminary and upon coder review may  be revised to meet current compliance requirements. Gatha Mayer, MD 08/13/2022 12:45:30 PM This report has been signed electronically. Number of Addenda: 0

## 2022-08-13 NOTE — Hospital Course (Addendum)
Taken from H&P.  Timothy [name] is a 61 y.o. male with medical history significant of  medical history of upper GI bleed sp embolization 11/2021, for duodenal ulcer. He also has ESRD on HD TTS, heart failure, colon cancer sp colectomy, hypertension, Hep C and polysubstance abuse notes etoh in remission, who has interim history of admission for upper gi bleed 10/11-/10/18 . AT that time endoscope noted blood in duodenum but no source was identified. Patient was transfused 4 unit PRBC with good response. At that time consult was placed to IR who noted patient was not a candidate for repeat embolization due no noted vessels to target. AT that time medical management was recommended.  Patient was discharged  with hbg of 7.1, on ppi and told to avoid NSAIDS.  Patient was also seen in ED 10/21 again with abn hgb on routine labs and at that time patient denied any rectal bleeding or black stools. He was transfused one unit of blood s/p which he signed out AMA. Patient now returns to ED in referral from HD center after he was again found to have low hgb.  Patient currently notes mild sob, black stools but no chest pain, n/v/d/abdominal pain. He denies brbpr or nose bleeds.   ED course.  Hemodynamically stable.  Labs pertinent for hemoglobin of 4.9.  Creatinine of 4.27 which appears to be at baseline.Iron 87, tibc 347,folate 12.4, b12 1,076, reticulocyte count at 6.  Chest x-ray without any significant abnormality. 2 unit of PRBC ordered.  10/29: Later at night patient developed left-sided chest pain, repeat EKG was without any acute abnormality, troponin 108>107, most likely secondary to demand ischemia with anemia.  Hemoglobin at 6.3 after 2 unit, received third unit in hemoglobin only improved to 6.5.  Fourth unit ordered. [facility] was consulted, she was taken for EGD, found to have a single bleeding angiodysplastic lesion in the second portion of duodenum,, s/p argon plasma concentrated with some bleeding so a  clip was also placed.  Also found to have erosive duodenitis. GI is recommending twice daily PPI

## 2022-08-13 NOTE — Assessment & Plan Note (Signed)
See above

## 2022-08-13 NOTE — Anesthesia Preprocedure Evaluation (Addendum)
Anesthesia Evaluation  Patient identified by MRN, date of birth, ID band Patient awake    Reviewed: Allergy & Precautions, NPO status , Patient's Chart, lab work & pertinent test results, reviewed documented beta blocker date and time   History of Anesthesia Complications Negative for: history of anesthetic complications  Airway Mallampati: II  TM Distance: >3 FB Neck ROM: Full    Dental  (+) Dental Advisory Given, Missing   Pulmonary asthma , Current Smoker and Patient abstained from smoking.,    Pulmonary exam normal        Cardiovascular hypertension, Pt. on home beta blockers and Pt. on medications pulmonary hypertensionNormal cardiovascular exam+ Valvular Problems/Murmurs    '23 TTE - EF 50 to 55%. The left ventricular internal cavity size was mildly dilated. There is moderate left ventricular hypertrophy. Grade II diastolic dysfunction (pseudonormalization). Right ventricular systolic function is mildly reduced. The right ventricular size is mildly enlarged. There is moderately elevated pulmonary artery systolic pressure. The estimated right ventricular systolic pressure is 13.0 mmHg. Left atrial size was moderately dilated. Right atrial size was mildly dilated. Mild mitral valve regurgitation. Tricuspid valve regurgitation is moderate to severe. Aortic valve regurgitation is trivial. There is mild dilatation of the ascending aorta, measuring 38 mm.     Neuro/Psych  Headaches, negative psych ROS   GI/Hepatic (+)     substance abuse  marijuana use and IV drug use, Hepatitis -, C S/p right hemicolectomy    Endo/Other   Na 130 Cl 88 P 6.8   Renal/GU ESRF and DialysisRenal disease     Musculoskeletal negative musculoskeletal ROS (+) narcotic dependent  Abdominal   Peds  Hematology  (+) Blood dyscrasia, anemia ,   Anesthesia Other Findings   Reproductive/Obstetrics                            Anesthesia Physical  Anesthesia Plan  ASA: 3  Anesthesia Plan: MAC   Post-op Pain Management: Minimal or no pain anticipated   Induction:   PONV Risk Score and Plan: 0 and Propofol infusion and Treatment may vary due to age or medical condition  Airway Management Planned: Nasal Cannula and Natural Airway  Additional Equipment: None  Intra-op Plan:   Post-operative Plan:   Informed Consent: I have reviewed the patients History and Physical, chart, labs and discussed the procedure including the risks, benefits and alternatives for the proposed anesthesia with the patient or authorized representative who has indicated his/her understanding and acceptance.       Plan Discussed with: CRNA and Anesthesiologist  Anesthesia Plan Comments:        Anesthesia Quick Evaluation

## 2022-08-13 NOTE — Assessment & Plan Note (Deleted)
S/p cholectomy. -Continue with outpatient follow-up

## 2022-08-13 NOTE — ED Notes (Signed)
R. Rathore returned call at this time in reference to critical lab results

## 2022-08-13 NOTE — Assessment & Plan Note (Signed)
S/p colectomy -Continue outpatient follow-up

## 2022-08-13 NOTE — ED Notes (Signed)
Pt refused to be hooked back up to the cardiac monitor. But did respond to this RN that once his next unit of blood arrives to the unit he will let this RN hook him back up.

## 2022-08-13 NOTE — ED Notes (Signed)
Angela Nevin (spouse) would like to be called with a status update on pt. Phone is [phone]

## 2022-08-13 NOTE — Assessment & Plan Note (Signed)
Nephrology is on board. -Continue with routine dialysis

## 2022-08-13 NOTE — Transfer of Care (Signed)
Immediate Anesthesia Transfer of Care Note  Patient: Timothy [name]  Procedure(s) Performed: ESOPHAGOGASTRODUODENOSCOPY (EGD) WITH PROPOFOL HOT HEMOSTASIS (ARGON PLASMA COAGULATION/BICAP)  Patient Location: Endoscopy Unit  Anesthesia Type:MAC  Level of Consciousness: drowsy and patient cooperative  Airway & Oxygen Therapy: Patient Spontanous Breathing and Patient connected to nasal cannula oxygen  Post-op Assessment: Report given to RN and Post -op Vital signs reviewed and stable  Post vital signs: Reviewed and stable  Last Vitals:  Vitals Value Taken Time  BP 139/78 08/13/22 1225  Temp    Pulse 75 08/13/22 1225  Resp 22 08/13/22 1225  SpO2 94 % 08/13/22 1225    Last Pain:  Vitals:   08/13/22 1108  TempSrc: Temporal  PainSc: 0-No pain         Complications: No notable events documented.

## 2022-08-13 NOTE — ED Notes (Signed)
Rathore, MD has been notified regarding patients current condition; new orders obtained.

## 2022-08-13 NOTE — Anesthesia Procedure Notes (Signed)
Procedure Name: MAC Date/Time: 08/13/2022 11:46 AM  Performed by: Janene Harvey, CRNAPre-anesthesia Checklist: Patient identified, Emergency Drugs available, Suction available and Patient being monitored Patient Re-evaluated:Patient Re-evaluated prior to induction Oxygen Delivery Method: Nasal cannula Induction Type: IV induction Placement Confirmation: positive ETCO2 Dental Injury: Teeth and Oropharynx as per pre-operative assessment

## 2022-08-13 NOTE — Assessment & Plan Note (Addendum)
Blood pressure intermittently elevated. -Continue with home Coreg, hydralazine and Cardizem

## 2022-08-13 NOTE — Assessment & Plan Note (Signed)
Seems well compensated. -Continue to monitor

## 2022-08-13 NOTE — Progress Notes (Addendum)
Overnight progress note  Notified by RN that patient is complaining of 9 out of 10 intensity left-sided chest pain and pain in his neck.  Vital signs: Temperature 98.9, heart rate 82, respiratory rate 17, blood pressure 167/121, SPO2 98% on room air.  -EKG repeated and without acute ischemic changes.  Stat troponin x2 ordered. -Dilaudid 0.5 mg x 1 ordered for pain -Hemoglobin was 4.9 at the time of admission and he finished receiving 2 units PRBCs.  Repeat CBC ordered. -UDS ordered given history of substance abuse -Continue to monitor very closely  Addendum/update 08/13/2022 at 3:19 AM: Labs showing hemoglobin 6.3 and troponin 108. Notified by RN that patient is sleeping and no longer endorsing chest pain.  Vital signs stable except blood pressure 185/104.  Suspecting chest pain and troponin elevation are likely due to demand ischemia in the setting of anemia. -Will repeat troponin and if trending up, cardiology to be consulted. -Additional 1 unit PRBCs ordered.  Follow-up posttransfusion H&H. -He is on p.o. Coreg and Cardizem for hypertension.  Ordered IV hydralazine PRN SBP >160 or DBP >110

## 2022-08-13 NOTE — Assessment & Plan Note (Signed)
Found on EGD s/p argon plasma concentrated treatment.  A clip was also placed. -Continue with twice daily PPI

## 2022-08-13 NOTE — Assessment & Plan Note (Signed)
Continue with Coreg and Cardizem Not on any anticoagulation due to recurrent GI bleed

## 2022-08-13 NOTE — ED Notes (Signed)
Received verbal report from [place] at this time

## 2022-08-13 NOTE — Anesthesia Postprocedure Evaluation (Signed)
Anesthesia Post Note  Patient: Timothy [name]  Procedure(s) Performed: ESOPHAGOGASTRODUODENOSCOPY (EGD) WITH PROPOFOL HOT HEMOSTASIS (ARGON PLASMA COAGULATION/BICAP)     Patient location during evaluation: PACU Anesthesia Type: MAC Level of consciousness: awake and alert Pain management: pain level controlled Vital Signs Assessment: post-procedure vital signs reviewed and stable Respiratory status: spontaneous breathing, nonlabored ventilation and respiratory function stable Cardiovascular status: stable and blood pressure returned to baseline Anesthetic complications: no   No notable events documented.  Last Vitals:  Vitals:   08/13/22 1240 08/13/22 1250  BP: 134/79 135/82  Pulse: 70 71  Resp: (!) 8 19  Temp:    SpO2: 93% 92%    Last Pain:  Vitals:   08/13/22 1108  TempSrc: Temporal  PainSc: 0-No pain                 Audry Pili

## 2022-08-13 NOTE — Consult Note (Addendum)
Consultation  Referring Provider: ERMD/ Kathrynn Humble Primary Care Physician:  [name], MD Primary Gastroenterologist:  unassigned  Reason for Consultation:  profound anemia, HGB 4.9, weakness  HPI: Timothy [name] is a 61 y.o. male, with end-stage renal disease on dialysis, history of colon cancer status post partial colectomy, history of congestive heart failure, hypertension, polysubstance abuse and treated hepatitis C. Patient was brought to the emergency room yesterday afternoon with persistent weakness after dialysis and concern for low hemoglobin.  Initial labs in ER with hemoglobin 4.9. Patient says he has been having black stools over the past several days, no overt blood obvious, denies any nausea vomiting or hematemesis.  No complaints of abdominal pain. He says he has not been on any ulcer medication as he did not get any when he was discharged from the hospital last time. Denies any aspirin or NSAID use. Patient has significant prior history of GI bleeding.  He had bleed in January 2023 and ultimately required embolization and coiling to the gastroduodenal artery, then had repeat IR intervention with foam embolization to the anterior and posterior pancreaticoduodenal arcade. He was recently readmitted in September and at that time had undergone colonoscopy which showed a patent side-to-side anastomosis ileocolonic, there were a few diminutive ulcers noted in the transverse colon, clean based. Prior to that he had had EGD on 07/05/2022 with finding of foreign bodies in the duodenum consistent with previous coils, there was a single 2 mm mucosal abnormality just proximal to the ampulla which was oozing and this was treated with Endo Clip Days later he had repeat EGD with embolization coils noted, no active bleeding, no ulcers.  Readmitted again in mid October with recurrent bleeding, melena and anemia.  He underwent small bowel endoscopy which demonstrated the previous coils  from embolization in the duodenal bulb there was some clotted blood in the second portion of the duodenum near the clip at the ampulla MRI/MRCP 07/30/2022-to further evaluate the area of the ampulla-the ampullary region somewhat obscured by artifact coils and posterior pancreatic head obscured left main duct prominence noted in the pancreas although assessment limited by lack of IV contrast, cholelithiasis, intraperitoneal free fluid and diffuse mesenteric edema.  He underwent EGD with Dr. Fuller Plan on 07/31/2022 that showed the coils from prior embolization, and a few small erosions in the duodenal bulb, there was a blush of red blood in the bulb and second portion, site not clear but unable to redemonstrate the blush. Patient was transfused, hemoglobin was 6.0 on 08/05/2022 he had another unit of blood and was then discharged on twice daily PPI.  He has been hemodynamically stable overnight, blood pressure currently 166/90 Says he had 1 black stool overnight Labs today hemoglobin 6.3/hematocrit 9.1 after 2 units BUN 43/creatinine 4.98 Potassium 3.6 Drug screen pending Troponin 108 Iron studies yesterday serum iron 87 TIBC 340 iron sat 25 ferritin 285/EtOH less than 10     Past Medical History:  Diagnosis Date   Acute metabolic encephalopathy 17/51/0258   Anemia of chronic kidney failure    BPH (benign prostatic hyperplasia)    Colon cancer ([place]) 2014   spouse states he had surgury for colon CA   End stage renal disease on dialysis [facility]) 2017   TTHSat   Hepatitis C    treated   Hypertension    Hypertensive crisis 04/07/2022   Hypertensive heart disease with chronic diastolic congestive heart failure ([place]) 07/17/2016   Polysubstance abuse ([place])    History of heroin and marijuana  use   Thoracic aortic aneurysm [facility])    followed by Dr. Ellyn Hack    Past Surgical History:  Procedure Laterality Date   A/V FISTULAGRAM Left 12/02/2021   Procedure: A/V Fistulagram;  Surgeon: Cherre Robins, MD;  Location: [place] CV LAB;  Service: Cardiovascular;  Laterality: Left;   A/V FISTULAGRAM Left 04/24/2022   Procedure: A/V Fistulagram;  Surgeon: Waynetta Sandy, MD;  Location: [place] CV LAB;  Service: Cardiovascular;  Laterality: Left;   ABDOMINAL SURGERY     AV FISTULA PLACEMENT Left 09/19/2016   Procedure: Left arm Radiocephalic ARTERIOVENOUS (AV) FISTULA CREATION;  Surgeon: Conrad Berthoud, MD;  Location: [place];  Service: Vascular;  Laterality: Left;   BASCILIC VEIN TRANSPOSITION Left 07/09/2017   Procedure: BRACHIOCEPHALIC FISTULA CREATION;  Surgeon: Conrad [place], MD;  Location: [place];  Service: Vascular;  Laterality: Left;   BIOPSY  07/05/2022   Procedure: BIOPSY;  Surgeon: Thornton Park, MD;  Location: [place];  Service: Gastroenterology;;   COLON SURGERY  2014   COLONOSCOPY N/A 07/12/2022   Procedure: COLONOSCOPY;  Surgeon: Doran Stabler, MD;  Location: [place];  Service: Gastroenterology;  Laterality: N/A;   COLONOSCOPY WITH PROPOFOL N/A 07/05/2022   Procedure: COLONOSCOPY WITH PROPOFOL;  Surgeon: Thornton Park, MD;  Location: [place];  Service: Gastroenterology;  Laterality: N/A;   ENTEROSCOPY N/A 07/28/2022   Procedure: ENTEROSCOPY;  Surgeon: [name], MD;  Location: [facility] ENDOSCOPY;  Service: Gastroenterology;  Laterality: N/A;   ESOPHAGOGASTRODUODENOSCOPY (EGD) WITH PROPOFOL N/A 11/05/2021   Procedure: ESOPHAGOGASTRODUODENOSCOPY (EGD) WITH PROPOFOL;  Surgeon: Carol Ada, MD;  Location: [place];  Service: Endoscopy;  Laterality: N/A;   ESOPHAGOGASTRODUODENOSCOPY (EGD) WITH PROPOFOL N/A 11/14/2021   Procedure: ESOPHAGOGASTRODUODENOSCOPY (EGD) WITH PROPOFOL;  Surgeon: [name], MD;  Location: [place];  Service: Gastroenterology;  Laterality: N/A;   ESOPHAGOGASTRODUODENOSCOPY (EGD) WITH PROPOFOL N/A 11/16/2021   Procedure: ESOPHAGOGASTRODUODENOSCOPY (EGD) WITH PROPOFOL;  Surgeon: [name], MD;  Location: [place];  Service: Gastroenterology;  Laterality: N/A;   ESOPHAGOGASTRODUODENOSCOPY (EGD) WITH PROPOFOL N/A 07/05/2022   Procedure: ESOPHAGOGASTRODUODENOSCOPY (EGD) WITH PROPOFOL;  Surgeon: Thornton Park, MD;  Location: [place];  Service: Gastroenterology;  Laterality: N/A;   ESOPHAGOGASTRODUODENOSCOPY (EGD) WITH PROPOFOL N/A 07/12/2022   Procedure: ESOPHAGOGASTRODUODENOSCOPY (EGD) WITH PROPOFOL;  Surgeon: Doran Stabler, MD;  Location: [place];  Service: Gastroenterology;  Laterality: N/A;   ESOPHAGOGASTRODUODENOSCOPY (EGD) WITH PROPOFOL N/A 07/31/2022   Procedure: ESOPHAGOGASTRODUODENOSCOPY (EGD) WITH PROPOFOL;  Surgeon: Ladene Artist, MD;  Location: [place];  Service: Gastroenterology;  Laterality: N/A;   HEMOSTASIS CLIP PLACEMENT  11/16/2021   Procedure: HEMOSTASIS CLIP PLACEMENT;  Surgeon: [name], MD;  Location: [facility] ENDOSCOPY;  Service: Gastroenterology;;   HEMOSTASIS CLIP PLACEMENT  07/05/2022   Procedure: HEMOSTASIS CLIP PLACEMENT;  Surgeon: Thornton Park, MD;  Location: [facility] ENDOSCOPY;  Service: Gastroenterology;;   HEMOSTASIS CONTROL  11/05/2021   Procedure: HEMOSTASIS CONTROL;  Surgeon: Carol Ada, MD;  Location: [place];  Service: Endoscopy;;   HEMOSTASIS CONTROL  11/16/2021   Procedure: HEMOSTASIS CONTROL;  Surgeon: [name], MD;  Location: [place];  Service: Gastroenterology;;   HEMOSTASIS CONTROL  07/05/2022   Procedure: HEMOSTASIS CONTROL;  Surgeon: Thornton Park, MD;  Location: [place];  Service: Gastroenterology;;   HOT HEMOSTASIS N/A 11/16/2021   Procedure: HOT HEMOSTASIS (ARGON PLASMA COAGULATION/BICAP);  Surgeon: [name], MD;  Location: [place];  Service: Gastroenterology;  Laterality: N/A;   INSERTION OF DIALYSIS CATHETER  N/A 09/19/2016   Procedure: INSERTION OF TUNNELED DIALYSIS CATHETER;  Surgeon: Conrad Hayti, MD;  Location: [place];  Service: Vascular;  Laterality: N/A;   IR ANGIOGRAM SELECTIVE EACH  ADDITIONAL VESSEL  11/16/2021   IR ANGIOGRAM VISCERAL SELECTIVE  11/05/2021   IR ANGIOGRAM VISCERAL SELECTIVE  11/16/2021   IR ANGIOGRAM VISCERAL SELECTIVE  11/16/2021   IR EMBO ART  VEN HEMORR LYMPH EXTRAV  INC GUIDE ROADMAPPING  11/05/2021   IR EMBO ART  VEN HEMORR LYMPH EXTRAV  INC GUIDE ROADMAPPING  11/16/2021   IR GENERIC HISTORICAL  09/14/2016   IR US GUIDE VASC ACCESS RIGHT 09/14/2016 [name], DO [facility]INTER[facility]   IR GENERIC HISTORICAL  09/14/2016   IR FLUORO GUIDE CV LINE RIGHT 09/14/2016 [name], DO [facility]   IR PARACENTESIS  06/22/2021   IR US GUIDE [place] RIGHT  11/05/2021   IR US GUIDE [place] RIGHT  11/16/2021   LAPAROTOMY N/A 05/23/2021   Procedure: EXPLORATORY LAPAROTOMY LYSIS ADHESIONS;  Surgeon: [name], MD;  Location: [place];  Service: General;  Laterality: N/A;   LAPAROTOMY N/A 11/20/2021   Procedure: EXPLORATORY LAPAROTOMY, REPAIR OF BLEEDING DUODENAL ULCER;  Surgeon: Dwan Bolt, MD;  Location: [place];  Service: General;  Laterality: N/A;   ORIF TIBIA PLATEAU Left 01/15/2018   Procedure: OPEN REDUCTION INTERNAL FIXATION (ORIF) TIBIAL PLATEAU;  Surgeon: Altamese Fenwick, MD;  Location: [place];  Service: Orthopedics;  Laterality: Left;   REVISON OF ARTERIOVENOUS FISTULA Left 05/26/2022   Procedure: REVISION OF LEFT ARM ARTERIOVENOUS FISTULA WITH PLICATION OF PSEUDOANEURYSM;  Surgeon: Waynetta Sandy, MD;  Location: [place];  Service: Vascular;  Laterality: Left;   SCLEROTHERAPY  11/05/2021   Procedure: Clide Deutscher;  Surgeon: Carol Ada, MD;  Location: [place];  Service: Endoscopy;;   SCLEROTHERAPY  11/16/2021   Procedure: Clide Deutscher;  Surgeon: [name], MD;  Location: [facility] ENDOSCOPY;  Service: Gastroenterology;;   TRANSTHORACIC ECHOCARDIOGRAM  07/2016    EF 60-65%, No RWMA. Mod Concentric LVH - Gr 2 DD. Severe LA dilation. PAP ~35 mmHg (mild Pulm HTN)  --> no changes noted 1 month later    Prior to Admission medications   Medication  Sig Start Date End Date Taking? Authorizing Provider  albuterol (VENTOLIN HFA) 108 (90 Base) MCG/ACT inhaler Inhale 2 puffs into the lungs every 6 (six) hours as needed for wheezing or shortness of breath. 01/06/22  Yes [provider]  carvedilol (COREG) 6.25 MG tablet Take 1 tablet (6.25 mg total) by mouth 2 (two) times daily with a meal. 08/02/22 09/01/22 Yes Arrien, Jimmy Picket, MD  diltiazem (CARDIZEM CD) 360 MG 24 hr capsule Take 1 capsule (360 mg total) by mouth daily. 08/03/22 09/02/22 Yes Arrien, Jimmy Picket, MD  DULoxetine (CYMBALTA) 20 MG capsule Take 20 mg by mouth daily. 01/06/22  Yes [provider]  hydrALAZINE (APRESOLINE) 50 MG tablet Take 1 tablet (50 mg total) by mouth 2 (two) times daily. 08/02/22 09/01/22 Yes Arrien, Jimmy Picket, MD  lidocaine (LMX) 4 % cream Apply 1 Application topically 3 (three) times a week. 06/27/22  Yes [provider]  LIDOCAINE-PRILOCAINE EX Apply 1 application  topically Every Tuesday,Thursday,and Saturday with dialysis.   Yes [provider]  oxyCODONE (OXY IR/ROXICODONE) 5 MG immediate release tablet Take 5 mg by mouth 3 (three) times daily as needed. 08/09/22  Yes [provider]  pantoprazole (PROTONIX) 40 MG tablet Take 1 tablet (40 mg total) by mouth 2 (two) times daily.  08/02/22 09/01/22 Yes Arrien, Jimmy Picket, MD  sevelamer carbonate (RENVELA) 800 MG tablet Take 2 tablets (1,600 mg total) by mouth 3 (three) times daily with meals. 08/02/22 09/01/22 Yes Arrien, Jimmy Picket, MD    Current Facility-Administered Medications  Medication Dose Route Frequency Provider Last Rate Last Admin   0.9 %  sodium chloride infusion (Manually program via Guardrails IV Fluids)   Intravenous Once Shela Leff, MD   Held at 08/13/22 0416   0.9 %  sodium chloride infusion (Manually program via Guardrails IV Fluids)   Intravenous Once Clance Boll, MD   Held at 08/13/22 0745   0.9 %  sodium  chloride infusion  10 mL/hr Intravenous Once Varney Biles, MD   Held at 08/12/22 1623   albuterol (PROVENTIL) (2.5 MG/3ML) 0.083% nebulizer solution 2.5 mg  2.5 mg Inhalation Q6H PRN Clance Boll, MD       carvedilol (COREG) tablet 6.25 mg  6.25 mg Oral BID WC Myles Rosenthal A, MD   6.25 mg at 08/13/22 0816   diltiazem (CARDIZEM CD) 24 hr capsule 360 mg  360 mg Oral Daily Myles Rosenthal A, MD   360 mg at 08/13/22 0147   DULoxetine (CYMBALTA) DR capsule 20 mg  20 mg Oral Daily Myles Rosenthal A, MD   20 mg at 08/12/22 2302   hydrALAZINE (APRESOLINE) injection 5 mg  5 mg Intravenous Q4H PRN Shela Leff, MD   5 mg at 08/13/22 0414   melatonin tablet 3 mg  3 mg Oral QHS PRN Shela Leff, MD   3 mg at 08/13/22 0124   naloxone (NARCAN) injection 0.4 mg  0.4 mg Intravenous PRN Shela Leff, MD       oxyCODONE (Oxy IR/ROXICODONE) immediate release tablet 5 mg  5 mg Oral TID PRN Clance Boll, MD   5 mg at 08/12/22 2302   pantoprazole (PROTONIX) injection 40 mg  40 mg Intravenous Q12H Myles Rosenthal A, MD   40 mg at 08/12/22 2304   sevelamer carbonate (RENVELA) tablet 1,600 mg  1,600 mg Oral TID WC Myles Rosenthal A, MD   1,600 mg at 08/13/22 3329   Current Outpatient Medications  Medication Sig Dispense Refill   albuterol (VENTOLIN HFA) 108 (90 Base) MCG/ACT inhaler Inhale 2 puffs into the lungs every 6 (six) hours as needed for wheezing or shortness of breath.     carvedilol (COREG) 6.25 MG tablet Take 1 tablet (6.25 mg total) by mouth 2 (two) times daily with a meal. 60 tablet 0   diltiazem (CARDIZEM CD) 360 MG 24 hr capsule Take 1 capsule (360 mg total) by mouth daily. 30 capsule 0   DULoxetine (CYMBALTA) 20 MG capsule Take 20 mg by mouth daily.     hydrALAZINE (APRESOLINE) 50 MG tablet Take 1 tablet (50 mg total) by mouth 2 (two) times daily. 60 tablet 0   lidocaine (LMX) 4 % cream Apply 1 Application topically 3 (three) times a week.      LIDOCAINE-PRILOCAINE EX Apply 1 application  topically Every Tuesday,Thursday,and Saturday with dialysis.     oxyCODONE (OXY IR/ROXICODONE) 5 MG immediate release tablet Take 5 mg by mouth 3 (three) times daily as needed.     pantoprazole (PROTONIX) 40 MG tablet Take 1 tablet (40 mg total) by mouth 2 (two) times daily. 60 tablet 0   sevelamer carbonate (RENVELA) 800 MG tablet Take 2 tablets (1,600 mg total) by mouth 3 (three) times daily with meals. 180 tablet 0    Allergies  as of 08/12/2022   (No Known Allergies)    Family History  Problem Relation Age of Onset   Heart failure Mother        Died at age 79.   Heart attack Mother 22   Hypertension Mother    Diabetes Mellitus II Mother    Other Father        Unknown   Kidney failure Sister        (Oldest Sister)   Other Other        Multiple siblings have started her heart disease, he is not sure of the details.   CAD Nephew     Social History   Socioeconomic History   Marital status: Married    Spouse name: Not on file   Number of children: Not on file   Years of education: Not on file   Highest education level: Not on file  Occupational History   Occupation: disabled  Tobacco Use   Smoking status: Former    Packs/day: 0.25    Types: Cigarettes    Quit date: 05/12/2022    Years since quitting: 0.2    Passive exposure: Never   Smokeless tobacco: Never  Vaping Use   Vaping Use: Never used  Substance and Sexual Activity   Alcohol use: No    Alcohol/week: 7.0 standard drinks of alcohol    Types: 7 Cans of beer per week   Drug use: Yes    Frequency: 1.0 times per week    Types: Marijuana, Heroin    Comment: last time 05/25/22   Sexual activity: Yes    Comment: once a week  Other Topics Concern   Not on file  Social History Narrative   Not on file   Social Determinants of Health   Financial Resource Strain: Not on file  Food Insecurity: No Food Insecurity (07/27/2022)   Hunger Vital Sign    Worried About  Running Out of Food in the Last Year: Never true    [place] in the Last Year: Never true  Transportation Needs: No Transportation Needs (07/27/2022)   PRAPARE - Hydrologist (Medical): No    Lack of Transportation (Non-Medical): No  Physical Activity: Not on file  Stress: Not on file  Social Connections: Not on file  Intimate Partner Violence: Not At Risk (07/27/2022)   Humiliation, Afraid, Rape, and Kick questionnaire    Fear of Current or Ex-Partner: No    Emotionally Abused: No    Physically Abused: No    Sexually Abused: No    Review of Systems: Pertinent positive and negative review of systems were noted in the above HPI section.  All other review of systems was otherwise negative.   Physical Exam: Vital signs in last 24 hours: Temp:  [97.2 F (36.2 C)-98.9 F (37.2 C)] 97.2 F (36.2 C) (10/29 0709) Pulse Rate:  [71-83] 77 (10/29 0815) Resp:  [13-31] 20 (10/29 0815) BP: (136-185)/(66-162) 166/90 (10/29 0815) SpO2:  [90 %-100 %] 96 % (10/29 0815)   General:   Alert,  Well-developed, chronically ill-appearing older African-American male ,cooperative in NAD Head:  Normocephalic and atraumatic. Eyes:  Sclera clear, no icterus.   Conjunctiva pale Ears:  Normal auditory acuity. Nose:  No deformity, discharge,  or lesions. Mouth:  No deformity or lesions.   Neck:  Supple; no masses or thyromegaly. Lungs:  Clear throughout to auscultation.   No wheezes, crackles, or rhonchi. Heart:  Regular rate and rhythm;  no murmurs, clicks, rubs,  or gallops. Abdomen:  Soft,nontender, BS active,nonpalp mass or hsm. Incisional scar Rectal: not Done patient declined Msk:  Symmetrical without gross deformities. . Pulses:  Normal pulses noted. Extremities:  Without clubbing or edema. Neurologic:  Alert and  oriented x4;  grossly normal neurologically. Skin:  Intact without significant lesions or rashes.. Psych:  Alert and cooperative. Normal mood and  affect.  Intake/Output from previous day: No intake/output data recorded. Intake/Output this shift: No intake/output data recorded.  Lab Results: Recent Labs    08/12/22 1501 08/12/22 1530 08/13/22 0230 08/13/22 0407  WBC 6.1  --  6.6  --   HGB 4.9* 5.8* 6.3* 6.5*  HCT 16.1* 17.0* 19.1* 20.7*  PLT 278  --  256  --    BMET Recent Labs    08/12/22 1501 08/12/22 1530 08/13/22 0230  NA 136 135 135  K 3.3* 3.3* 3.6  CL 94* 89* 92*  CO2 29  --  28  GLUCOSE 134* 143* 85  BUN 35* 39* 43*  CREATININE 4.27* 4.50* 4.98*  CALCIUM 7.9*  --  7.9*   LFT Recent Labs    08/13/22 0230  PROT 6.7  ALBUMIN 2.7*  AST 27  ALT 18  ALKPHOS 114  BILITOT 0.5   PT/INR No results for input(s): "LABPROT", "INR" in the last 72 hours. Hepatitis Panel No results for input(s): "HEPBSAG", "HCVAB", "HEPAIGM", "HEPBIGM" in the last 72 hours.    IMPRESSION:  #57 61 year old African-American male, with end-stage renal disease on dialysis, brought to the emergency room after dialysis yesterday with persistent weakness and found to have hemoglobin 4.9  Patient now states that he has noticed black stools over the past several days but no complaints of abdominal pain, no nausea vomiting or hematemesis  He has significant history of recurrent GI bleeding over the past year as outlined above initially secondary to a duodenal ulcer which required GDA embolization and coiling in January 2023, then with repeat bleeding had Gelfoam embolization to the anterior/posterior pancreaticoduodenal arcade. Readmitted September 2023 with weakness melena and anemia, no definitive source on EGD x2, and colonoscopy x2 other than a small focal area of duodenal oozing proximal to the ampulla, but no evidence of oozing or lesion on repeat EGD later that same admit Readmitted mid October 2023 with similar symptoms, and small bowel enteroscopy again showing the coils from the prior embolization sites, there was some clotted  blood in the second portion of the duodenum next to the clip at the ampulla, and subsequent EGD showed some small erosions in the duodenal bulb and there was a blush of red blood in the bulb and second portion but site unable to be identified and no persistent bleeding.  He has not been on PPI  Exact site of patient's recurrent bleeding has been difficult to determine though it certainly seems to be coming from the duodenum/bulb/ampulla area.    #2 colon cancer status post partial colectomy 3 hypertension 4.  Congestive heart failure 5.  Hepatitis C treated 6.  Polysubstance abuse  Plan; keep n.p.o. IV PPI twice daily We will transfuse 1 more unit of packed RBCs now Continue serial hemoglobins and transfuse to keep hemoglobin above 7 We will plan for EGD later today with Dr. Carlean Purl.  Procedure was discussed in detail with the patient including indications risk and benefits and he is agreeable to proceed. EGD is unremarkable will need capsule endoscopy, and/or further imaging of the ampulla    Amy Esterwood  PA-C 08/13/2022, 8:57 AM     [facility] Attending   I have taken an interval history, reviewed the chart and examined the patient. I agree with the Advanced Practitioner's note, impression and recommendations.  Unusual case of recurrent UGI bleeding after coil embolization of GDA earlier this year as well as gelfoam embolization of pencreaticoduodenal arcade.  Severe decreased Hgb - Tx w/ transfusion.  Will do EGD today to reassess. Could need IR consult again.  The risks and benefits as well as alternatives of endoscopic procedure(s) have been discussed and reviewed. All questions answered. The patient agrees to proceed.  Gatha Mayer, MD, [facility] See Shea Evans on call - gastroenterology for best contact person 08/13/2022 10:37 AM

## 2022-08-13 NOTE — Progress Notes (Signed)
Progress Note   Patient: Timothy [name] BTD:[id number] DOB: 1960/10/22 DOA: 08/12/2022     1 DOS: the patient was seen and examined on 08/13/2022   Brief hospital course: Taken from H&P.  Timothy [name] is a 61 y.o. male with medical history significant of  medical history of upper [name] bleed sp embolization 11/2021, for duodenal ulcer. He also has ESRD on HD TTS, heart failure, colon cancer sp colectomy, hypertension, Hep C and polysubstance abuse notes etoh in remission, who has interim history of admission for upper [name] bleed 10/11-/10/18 . AT that time endoscope noted blood in duodenum but no source was identified. Patient was transfused 4 unit PRBC with good response. At that time consult was placed to IR who noted patient was not a candidate for repeat embolization due no noted vessels to target. AT that time medical management was recommended.  Patient was discharged  with hbg of 7.1, on ppi and told to avoid NSAIDS.  Patient was also seen in ED 10/21 again with abn hgb on routine labs and at that time patient denied any rectal bleeding or black stools. He was transfused one unit of blood s/p which he signed out AMA. Patient now returns to ED in referral from HD center after he was again found to have low hgb.  Patient currently notes mild sob, black stools but no chest pain, n/v/d/abdominal pain. He denies brbpr or nose bleeds.   ED course.  Hemodynamically stable.  Labs pertinent for hemoglobin of 4.9.  Creatinine of 4.27 which appears to be at baseline.Iron 87, tibc 347,folate 12.4, b12 1,076, reticulocyte count at 6.  Chest x-ray without any significant abnormality. 2 unit of PRBC ordered.  10/29: Later at night patient developed left-sided chest pain, repeat EKG was without any acute abnormality, troponin 108>107, most likely secondary to demand ischemia with anemia.  Hemoglobin at 6.3 after 2 unit, received third unit in hemoglobin only improved to 6.5.  Fourth unit ordered. Timothy [name] was  consulted, she was taken for EGD, found to have a single bleeding angiodysplastic lesion in the second portion of duodenum,, s/p argon plasma concentrated with some bleeding so a clip was also placed.  Also found to have erosive duodenitis. [name] is recommending twice daily PPI     Assessment and Plan: * Acute anemia Patient with recurrent [name] bleed. Hemoglobin at 6.5 after getting 3 unit of PRBC, fourth unit ordered. [name] was consulted-EGD with AVM of second portion of duodenum, s/p argon plasma treatment.  Also erosive duodenitis. 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  A clip was also placed. -Continue with twice daily PPI  ESRD on HD TTS Nephrology is on board. -Continue with routine dialysis  HTN (hypertension) Blood pressure intermittently elevated. -Continue with home Coreg, hydralazine and Cardizem  Paroxysmal A-fib (HCC) Continue with Coreg and Cardizem Not on any anticoagulation due to recurrent [name] bleed  Chronic combined systolic and diastolic CHF (congestive heart failure) ([place]) Seems well compensated. -Continue to monitor  History of colon cancer S/p colectomy -Continue outpatient follow-up   Subjective: Patient was seen and examined after the EGD.No complaints, Didn't had any BM.  Physical Exam: Vitals:   08/13/22 1225 08/13/22 1230 08/13/22 1240 08/13/22 1250  BP: 139/78 126/80 134/79 135/82  Pulse: 75 74 70 71  Resp: (!) 22 20 (!) 8 19  Temp:  TempSrc:      SpO2: 94% 94% 93% 92%   General.  Well-developed gentleman, in no acute distress. Pulmonary.  Lungs clear bilaterally, normal respiratory effort. CV.  Regular rate and rhythm, no JVD, rub or murmur. Abdomen.  Soft, nontender, nondistended, BS positive. CNS.  Alert and oriented .  No focal neurologic deficit. Extremities.  No edema, no cyanosis,  pulses intact and symmetrical. Psychiatry.  Judgment and insight appears normal.  Data Reviewed: Prior data reviewed  Family Communication:   Disposition: Status is: Inpatient Remains inpatient appropriate because: Severity of illness   Planned Discharge Destination: Home  Time spent: 50 minutes  This record has been created using Systems analyst. Errors have been sought and corrected,but may not always be located. Such creation errors do not reflect on the standard of care.  Author: Lorella Nimrod, MD 08/13/2022 3:00 PM  For on call review [email or web address].

## 2022-08-14 ENCOUNTER — Inpatient Hospital Stay (HOSPITAL_COMMUNITY): Payer: Medicare Other

## 2022-08-14 LAB — CBC WITH DIFFERENTIAL/PLATELET
Abs Immature Granulocytes: 0.02 10*3/uL (ref 0.00–0.07)
Basophils Absolute: 0 10*3/uL (ref 0.0–0.1)
Basophils Relative: 0 %
Eosinophils Absolute: 0.1 10*3/uL (ref 0.0–0.5)
Eosinophils Relative: 1 %
HCT: 18.7 % — ABNORMAL LOW (ref 39.0–52.0)
Hemoglobin: 5.8 g/dL — CL (ref 13.0–17.0)
Immature Granulocytes: 0 %
Lymphocytes Relative: 6 %
Lymphs Abs: 0.3 10*3/uL — ABNORMAL LOW (ref 0.7–4.0)
MCH: 30.2 pg (ref 26.0–34.0)
MCHC: 31 g/dL (ref 30.0–36.0)
MCV: 97.4 fL (ref 80.0–100.0)
Monocytes Absolute: 0.6 10*3/uL (ref 0.1–1.0)
Monocytes Relative: 12 %
Neutro Abs: 4 10*3/uL (ref 1.7–7.7)
Neutrophils Relative %: 81 %
Platelets: 172 10*3/uL (ref 150–400)
RBC: 1.92 MIL/uL — ABNORMAL LOW (ref 4.22–5.81)
RDW: 20.1 % — ABNORMAL HIGH (ref 11.5–15.5)
WBC: 4.9 10*3/uL (ref 4.0–10.5)
nRBC: 0 % (ref 0.0–0.2)

## 2022-08-14 LAB — HEMOGLOBIN AND HEMATOCRIT, BLOOD
HCT: 28.7 % — ABNORMAL LOW (ref 39.0–52.0)
Hemoglobin: 9.4 g/dL — ABNORMAL LOW (ref 13.0–17.0)

## 2022-08-14 LAB — CBC
HCT: 28 % — ABNORMAL LOW (ref 39.0–52.0)
Hemoglobin: 9.4 g/dL — ABNORMAL LOW (ref 13.0–17.0)
MCH: 30.6 pg (ref 26.0–34.0)
MCHC: 33.6 g/dL (ref 30.0–36.0)
MCV: 91.2 fL (ref 80.0–100.0)
Platelets: 247 10*3/uL (ref 150–400)
RBC: 3.07 MIL/uL — ABNORMAL LOW (ref 4.22–5.81)
RDW: 20.1 % — ABNORMAL HIGH (ref 11.5–15.5)
WBC: 7.3 10*3/uL (ref 4.0–10.5)
nRBC: 0 % (ref 0.0–0.2)

## 2022-08-14 LAB — CBG MONITORING, ED: Glucose-Capillary: 82 mg/dL (ref 70–99)

## 2022-08-14 MED ORDER — PANTOPRAZOLE SODIUM 40 MG PO TBEC
40.0000 mg | DELAYED_RELEASE_TABLET | Freq: Two times a day (BID) | ORAL | Status: DC
  Administered 2022-08-14: 40 mg via ORAL
  Filled 2022-08-14: qty 1

## 2022-08-14 MED ORDER — PANTOPRAZOLE SODIUM 40 MG PO TBEC: 40.0000 mg | DELAYED_RELEASE_TABLET | Freq: Two times a day (BID) | ORAL | 0 refills | Status: AC

## 2022-08-14 NOTE — ED Notes (Addendum)
Pt refusing all vital signs at this point, continuously removing monitoring cords. Pt was educated that this is to monitor his heart rhythm oxygen and pulse. Pt states "I ain't wearing it". Admitting notified

## 2022-08-14 NOTE — Progress Notes (Addendum)
Daily Rounding Note  08/14/2022, 8:55 AM  LOS: 2 days   SUBJECTIVE:   Chief complaint: Recurrent upper GI bleed and blood loss anemia.  Patient wants to go home.  Still in the ED as there are no floor beds available.  No bowel movements overnight or this morning.  Denies abdominal pain, shortness of breath, dizziness.  Tolerating solid food.  OBJECTIVE:         Vital signs in last 24 hours:    Temp:  [97.7 F (36.5 C)-98.2 F (36.8 C)] 97.7 F (36.5 C) (10/30 0800) Pulse Rate:  [64-79] 65 (10/30 0800) Resp:  [8-22] 18 (10/30 0800) BP: (126-181)/(78-110) 159/95 (10/30 0800) SpO2:  [92 %-97 %] 93 % (10/30 0800) Weight:  [80 kg] 80 kg (10/30 0044)   Filed Weights   08/14/22 0044  Weight: 80 kg   General: Looks chronically ill and in poor health.  Disengaged Heart: RRR Chest: Diminished breath sounds globally.  Poor inspiratory effort.  No dyspnea.  Mucoid vocal quality and slight cough. Abdomen: Soft without tenderness.  Bowel sounds present. Neuro/Psych: Oriented to self, place but not to the year, said 2020 then said 2021.  No tremors.  No asterixis.  Laconic, sleepy but arousable.  Intake/Output from previous day: 10/29 0701 - 10/30 0700 In: 915 [I.V.:300; Blood:615] Out: -   Intake/Output this shift: No intake/output data recorded.  Lab Results: Recent Labs    08/12/22 1501 08/12/22 1530 08/13/22 0230 08/13/22 0407 08/13/22 1134 08/14/22 0502  WBC 6.1  --  6.6  --   --  7.3  HGB 4.9*   < > 6.3* 6.5* 8.8* 9.4*  HCT 16.1*   < > 19.1* 20.7* 26.0* 28.0*  PLT 278  --  256  --   --  247   < > = values in this interval not displayed.   BMET Recent Labs    08/12/22 1501 08/12/22 1530 08/13/22 0230 08/13/22 1134  NA 136 135 135 131*  K 3.3* 3.3* 3.6 5.5*  CL 94* 89* 92* 92*  CO2 29  --  28  --   GLUCOSE 134* 143* 85 97  BUN 35* 39* 43* 61*  CREATININE 4.27* 4.50* 4.98* 5.80*  CALCIUM 7.9*  --   7.9*  --    LFT Recent Labs    08/12/22 1501 08/13/22 0230  PROT 6.8 6.7  ALBUMIN 2.9* 2.7*  AST 30 27  ALT 18 18  ALKPHOS 110 114  BILITOT 0.4 0.5   PT/INR No results for input(s): "LABPROT", "INR" in the last 72 hours. Hepatitis Panel No results for input(s): "HEPBSAG", "HCVAB", "HEPAIGM", "HEPBIGM" in the last 72 hours.  Studies/Results: CT Head Wo Contrast  Result Date: 08/12/2022 CLINICAL DATA:  Altered mental status EXAM: CT HEAD WITHOUT CONTRAST TECHNIQUE: Contiguous axial images were obtained from the base of the skull through the vertex without intravenous contrast. RADIATION DOSE REDUCTION: This exam was performed according to the departmental dose-optimization program which includes automated exposure control, adjustment of the mA and/or kV according to patient size and/or use of iterative reconstruction technique. COMPARISON:  04/06/2022 FINDINGS: Brain: No acute intracranial findings are seen. There are no signs of bleeding within the cranium. Ventricles are not dilated. Cortical sulci are prominent. Vascular: Unremarkable. Skull: No fracture is seen in calvarium. Sinuses/Orbits: Small osteoma is seen in right ethmoid sinus. There is mild mucosal thickening in ethmoid and sphenoid sinuses. Other: None. IMPRESSION: No acute intracranial findings  are seen in noncontrast CT brain. Electronically Signed   By: Elmer Picker M.D.   On: 08/12/2022 16:11   DG Chest Port 1 View  Result Date: 08/12/2022 CLINICAL DATA:  Weakness.  Abnormal lung sounds. EXAM: PORTABLE CHEST 1 VIEW COMPARISON:  08/02/2022 FINDINGS: 1543 hours. Low volume film. Asymmetric elevation right hemidiaphragm, as before. Subsegmental atelectasis or linear scarring again noted right base. The cardio pericardial silhouette is enlarged. There is pulmonary vascular congestion without overt pulmonary edema. Telemetry leads overlie the chest. IMPRESSION: Low volume film with cardiomegaly and vascular congestion.  Electronically Signed   By: Misty Stanley M.D.   On: 08/12/2022 16:01    ASSESMENT:     UGIB.  Melena.   GDA coil, Gelfoam embolization of pancreaticoduodenal arcade 10/2021.  06/2022 clipping of bleeding lesion at ampulla.  Had not been on PPI at home (says it was not prescribed though I see it on his discharge med list as Protonix 40 bid).  06/2022 colonoscopy with patent side-to-side ileocolonic anastomosis w clean-based, diminutive ulcers in transverse colon.  Anastomosis 08/13/2022 EGD: Solitary, recently bleeding duodenal AVM treated with APC, initially bled following intervention but eventually stopped bleeding.  Clips placed though application challenging.  Duodenitis.  Coils from previous embolization seen and D2 associated with slight inflammation but no bleeding.  Gastritis.  Blood loss anemia.Hgb 4.9.Marland Kitchen 5 PRBCs.. 9.4.    Treated HCV.  MRI abdomen/MRCP 07/30/2022 with embolization coil artifact obscuring pancreatic head though some prominence of the main pancreatic duct.  Cholelithiasis.  Intraperitoneal fluid, diffuse mesenteric edema.  Stable hyperintensity lesion in spleen.  Renal cysts.  Right kidney mass not well defined but previously described as complex cyst on CTA.  HCV quant: Virus not detected in 02/2022 other than low albumin LFTs normal.  INR normal 6 weeks ago.  Na currently low.      Hyponatremia.      Colon cancer, s/p resection.     ESRD, on HD TTS.Marland Kitchen     PLAN   Patient probably needs another night of observation in the hospital or at least a repeat Hgb this afternoon before discharge.  I emphasized the fact that he needs to get the prescription for Protonix filled and take this.  He listened but I am not sure how well he absorbed this information.  Perhaps when he returns to dialysis either later this week or next week, he should bring all of his current medications in a bag so that dialysis providers can make sure he has the Protonix.  For now GI is signing off.  Call  back if needed.  No plans for outpatient follow-up.  Just need to make sure that he complies with PPI.    Azucena Freed  08/14/2022, 8:55 AM Phone [phone]    Attending physician's note   I have taken history, reviewed the chart and examined the patient. I performed a substantive portion of this encounter, including complete performance of at least one of the key components, in conjunction with the APP. I agree with the Advanced Practitioner's note, impression and recommendations.   Pt all dressed to go home. "Leaving" Hb 5.8 to 9.4 (after 2U) EGD 10/29 showed duodenal AVM s/p APC/clips. Prev coils from prior GDA embolization with slight inflammation without bleeding.  Discussed compliance with Protonix.  He told me he would take Protonix 40 QD. Avoid nonsteroidals.   Carmell Austria, MD [facility] [phone]

## 2022-08-14 NOTE — ED Notes (Signed)
MD made aware of Hgb.

## 2022-08-14 NOTE — ED Notes (Signed)
Hgb 5.8 report from [place] from [facility] lab

## 2022-08-14 NOTE — ED Notes (Signed)
Pt refusing cardiac monitoring at this time.

## 2022-08-14 NOTE — ED Notes (Signed)
VS signs obtained and blood work. Pt then refusing all other care and wants to go home.

## 2022-08-14 NOTE — ED Notes (Signed)
Pt ambulated with no oxygen desaturation around room. Alert and oriented, speaking in full sentences.

## 2022-08-14 NOTE — ED Notes (Signed)
Pt continuously disconnecting him self from the vitals sign monitor, states he is just tier of been here and he wants to go home. Pt oriented that he is admitted and need to be on continues monitoring

## 2022-08-15 NOTE — Discharge Summary (Signed)
Physician Discharge Summary   Patient: Timothy [name] MRN: [id number] DOB: Mar 21, 1961  Admit date:     08/12/2022  Discharge date: 08/14/2022  Discharge Physician: Berle Mull  PCP: [name], MD  Recommendations at discharge: Follow-up with PCP in 1 week.   Follow-up Information     Osei-Bonsu, Iona Beard, MD. Schedule an appointment as soon as possible for a visit in 1 week(s).   Specialty: Internal Medicine Why: with CBC Contact information: [place] [id number] [phone]                Discharge Diagnoses: Principal Problem:   Acute anemia Active Problems:   Upper GI bleed   Angiodysplasia of duodenum   ESRD on HD TTS   HTN (hypertension)   Paroxysmal A-fib (HCC)   History of colon cancer   Chronic combined systolic and diastolic CHF (congestive heart failure) (HCC)  Assessment and Plan  Acute anemia Upper GI bleed secondary to AVM in duodenum. Patient with recurrent GI bleed. Presents with abnormal hemoglobin at dialysis center.  Hemoglobin was 4.9.  SP for PRBC transfusion. GI was consulted. Underwent EGD found to have single bleeding angiodysplastic lesion in the second portion of duodenum. Treated with APC Endo Clip. Current plan is to continue PPI twice daily. Patient adamantly wanted to leave the hospital. Repeat hemoglobin remained stable. Recommend repeat continue PPI twice daily.   ESRD on HD TTS Chronic combined systolic and diastolic CHF. Patient to continue HD outpatient on Tuesday. Appears to be mildly volume overloaded. Chest x-ray does not show any severe volume overload. Does not have any hypoxia on ambulation per RN.  HTN (hypertension) Blood pressure intermittently elevated. Continue with home Coreg, hydralazine and Cardizem   Paroxysmal A-fib (HCC) Continue with Coreg and Cardizem Not on any anticoagulation due to recurrent GI bleed   History of colon cancer S/p colectomy -Continue outpatient  follow-up   Consultants:  Gastroenterology   Procedures performed:  EGD  DISCHARGE MEDICATION: Allergies as of 08/14/2022   No Known Allergies      Medication List     TAKE these medications    albuterol 108 (90 Base) MCG/ACT inhaler Commonly known as: VENTOLIN HFA Inhale 2 puffs into the lungs every 6 (six) hours as needed for wheezing or shortness of breath.   carvedilol 6.25 MG tablet Commonly known as: COREG Take 1 tablet (6.25 mg total) by mouth 2 (two) times daily with a meal.   diltiazem 360 MG 24 hr capsule Commonly known as: CARDIZEM CD Take 1 capsule (360 mg total) by mouth daily.   DULoxetine 20 MG capsule Commonly known as: CYMBALTA Take 20 mg by mouth daily.   hydrALAZINE 50 MG tablet Commonly known as: APRESOLINE Take 1 tablet (50 mg total) by mouth 2 (two) times daily.   lidocaine 4 % cream Commonly known as: LMX Apply 1 Application topically 3 (three) times a week.   LIDOCAINE-PRILOCAINE EX Apply 1 application  topically Every Tuesday,Thursday,and Saturday with dialysis.   oxyCODONE 5 MG immediate release tablet Commonly known as: Oxy IR/ROXICODONE Take 5 mg by mouth 3 (three) times daily as needed.   pantoprazole 40 MG tablet Commonly known as: PROTONIX Take 1 tablet (40 mg total) by mouth 2 (two) times daily before a meal. What changed: when to take this   sevelamer carbonate 800 MG tablet Commonly known as: RENVELA Take 2 tablets (1,600 mg total) by mouth 3 (three) times daily with meals.       Disposition:  Home Diet recommendation: Renal diet  Discharge Exam: Vitals:   08/14/22 1152 08/14/22 1230 08/14/22 1306 08/14/22 1539  BP: (!) 148/82 (!) 144/83 (!) 149/82 134/76  Pulse: 66 65 70 (!) 59  Resp: '18  18 16  '$ Temp: 98 F (36.7 C)  97.7 F (36.5 C) (!) 97.3 F (36.3 C)  TempSrc: Oral  Oral Oral  SpO2: 96% 91% 98% 97%  Weight:      Height:       General: Appear in no distress; no visible Abnormal Neck Mass Or lumps,  Conjunctiva normal Cardiovascular: S1 and S2 Present, no Murmur, Respiratory: good respiratory effort, Bilateral Air entry present and no Crackles, bilateral wheezes Abdomen: Bowel Sound present, Non tender  Extremities: no Pedal edema Neurology: alert and oriented to time, place, and person  [facility] Weights   08/14/22 0044  Weight: 80 kg   Condition at discharge: stable  The results of significant diagnostics from this hospitalization (including imaging, microbiology, ancillary and laboratory) are listed below for reference.   Imaging Studies: DG Chest 2 View  Result Date: 08/14/2022 CLINICAL DATA:  A 61 year old male presents for evaluation cough and weakness low hemoglobin. EXAM: CHEST - 2 VIEW COMPARISON:  August 12, 2022. FINDINGS: Cardiomediastinal contours and hilar structures are stable with cardiac enlargement. There is interval development of interstitial prominence throughout the chest. RIGHT hemidiaphragm remains elevated. No lobar consolidation. No sign of pneumothorax. On limited assessment there is no acute skeletal process. IMPRESSION: Cardiomegaly with interval development of interstitial prominence throughout the chest.w findings most suspicious for volume overload or heart failure. Correlate with any signs of infection. Electronically Signed   By: Zetta Bills M.D.   On: 08/14/2022 12:52   CT Head Wo Contrast  Result Date: 08/12/2022 CLINICAL DATA:  Altered mental status EXAM: CT HEAD WITHOUT CONTRAST TECHNIQUE: Contiguous axial images were obtained from the base of the skull through the vertex without intravenous contrast. RADIATION DOSE REDUCTION: This exam was performed according to the departmental dose-optimization program which includes automated exposure control, adjustment of the mA and/or kV according to patient size and/or use of iterative reconstruction technique. COMPARISON:  04/06/2022 FINDINGS: Brain: No acute intracranial findings are seen. There are no signs  of bleeding within the cranium. Ventricles are not dilated. Cortical sulci are prominent. Vascular: Unremarkable. Skull: No fracture is seen in calvarium. Sinuses/Orbits: Small osteoma is seen in right ethmoid sinus. There is mild mucosal thickening in ethmoid and sphenoid sinuses. Other: None. IMPRESSION: No acute intracranial findings are seen in noncontrast CT brain. Electronically Signed   By: Elmer Picker M.D.   On: 08/12/2022 16:11   DG Chest Port 1 View  Result Date: 08/12/2022 CLINICAL DATA:  Weakness.  Abnormal lung sounds. EXAM: PORTABLE CHEST 1 VIEW COMPARISON:  08/02/2022 FINDINGS: 1543 hours. Low volume film. Asymmetric elevation right hemidiaphragm, as before. Subsegmental atelectasis or linear scarring again noted right base. The cardio pericardial silhouette is enlarged. There is pulmonary vascular congestion without overt pulmonary edema. Telemetry leads overlie the chest. IMPRESSION: Low volume film with cardiomegaly and vascular congestion. Electronically Signed   By: Misty Stanley M.D.   On: 08/12/2022 16:01   DG CHEST PORT 1 VIEW  Result Date: 08/02/2022 CLINICAL DATA:  Shortness of breath EXAM: PORTABLE CHEST 1 VIEW COMPARISON:  Chest radiograph 07/27/2022 FINDINGS: No pleural effusion. No pneumothorax. There is slightly improved aeration of the right lung compared to prior exam with persistent opacity at the right lung base, which could represent atelectasis or infection.  Cardiac and mediastinal contours enlarged unchanged from exam. Displaced rib fracture. Visualized upper is unremarkable. IMPRESSION: Slightly improved aeration of the right lung compared to prior exam with persistent opacity at the right lung base, which could represent atelectasis or infection. Electronically Signed   By: Marin Roberts M.D.   On: 08/02/2022 14:47   MR ABDOMEN MRCP WO CONTRAST  Result Date: 07/30/2022 CLINICAL DATA:  Bleeding from ampulla. EXAM: MRI ABDOMEN WITHOUT CONTRAST  (INCLUDING  MRCP) TECHNIQUE: Multiplanar multisequence MR imaging of the abdomen was performed. Heavily T2-weighted images of the biliary and pancreatic ducts were obtained, and three-dimensional MRCP images were rendered by post processing. COMPARISON:  CTA 07/05/2022 FINDINGS: Lower chest: The heart is enlarged. Hepatobiliary: Decreased signal intensity within the liver parenchyma on T2 imaging suggests iron deposition disease. No focal abnormality seen in the liver parenchyma on noncontrast imaging. 2 cm gallstone evident with smaller stone seen in the neck of the gallbladder. No intra or extrahepatic biliary duct dilatation. Ampullary region is obscured by beam hardening artifact from embolization coils. Pancreas: Posterior pancreatic head is obscured. Mild main duct prominence although assessment is limited by lack of intravenous contrast material and motion artifact. Spleen: 1.9 cm T2 hyperintense lesion in the dome of the spleen is stable since CT of 06/17/2021 suggesting benign etiology but cannot be definitively characterized. Adrenals/Urinary Tract: No adrenal nodule or mass. Numerous cysts are noted in both kidneys. 3.4 cm mass in the right kidney cannot be definitively characterized on noncontrast imaging but was described as a complex cyst on recent CTA. Stomach/Bowel: Stomach is unremarkable. No gastric wall thickening. No evidence of outlet obstruction. Duodenum is normally positioned as is the ligament of Treitz. No small bowel or colonic dilatation within the visualized abdomen. Vascular/Lymphatic: No abdominal aortic aneurysm. There is no gastrohepatic or hepatoduodenal ligament lymphadenopathy. No retroperitoneal or mesenteric lymphadenopathy. Other: Intraperitoneal free fluid with diffuse mesenteric edema evident. Musculoskeletal: No overtly suspicious marrow signal abnormality. IMPRESSION: 1. Ampullary region is obscured by beam hardening artifact from embolization coils. Posterior pancreatic head is  obscured. Mild main duct prominence noted in the pancreas although assessment is limited by lack of intravenous contrast material and motion artifact. 2. Cholelithiasis. 3. Intraperitoneal free fluid with diffuse mesenteric edema evident. 4. 1.9 cm T2 hyperintense lesion in the dome of the spleen is stable since CT of 06/17/2021 suggesting benign etiology although it cannot be definitively characterized on noncontrast imaging. 5. Numerous cysts in both kidneys. 3.4 cm mass in the right kidney cannot be definitively characterized on noncontrast imaging but was described as a complex cyst on recent CTA. Electronically Signed   By: Misty Stanley M.D.   On: 07/30/2022 13:48   DG Chest Port 1 View  Result Date: 07/27/2022 CLINICAL DATA:  61 year old male with weakness. EXAM: PORTABLE CHEST 1 VIEW COMPARISON:  CT Abdomen and Pelvis 07/05/2022 and earlier. FINDINGS: Portable AP supine view at 0352 hours. Streaky, linear atelectasis or scarring at the right lower lung does not appear significantly changed from last month. Borderline to mild cardiomegaly. Calcified aortic atherosclerosis. Stable cardiac size and mediastinal contours. Visualized tracheal air column is within normal limits. Difficult to exclude asymmetric increased background interstitial opacity in the right lung, but favor artifact instead (the right chest soft tissues also appear denser). No pneumothorax, pleural effusion or consolidation. IMPRESSION: 1. Difficult to exclude asymmetric right lung interstitial opacity or edema, but favor artifact due to portable technique instead. 2. Otherwise stable atelectasis or scarring in the right lower lung and  mild cardiomegaly. Electronically Signed   By: Genevie Ann M.D.   On: 07/27/2022 04:25    Microbiology: Results for orders placed or performed during the hospital encounter of 07/26/22  MRSA Next Gen by PCR, Nasal     Status: None   Collection Time: 07/27/22  8:50 PM   Specimen: Nasal Mucosa; Nasal Swab   Result Value Ref Range Status   MRSA by PCR Next Gen NOT DETECTED NOT DETECTED Final    Comment: (NOTE) The GeneXpert MRSA Assay (FDA approved for NASAL specimens only), is one component of a comprehensive MRSA colonization surveillance program. It is not intended to diagnose MRSA infection nor to guide or monitor treatment for MRSA infections. Test performance is not FDA approved in patients less than 24 years old. Performed at [facility], [place] [address], [place], [place] [address]    Labs: CBC: Recent Labs  Lab 08/12/22 1501 08/12/22 1530 08/13/22 0230 08/13/22 0407 08/13/22 1134 08/14/22 0502 08/14/22 1215 08/14/22 1438  WBC 6.1  --  6.6  --   --  7.3 4.9  --   NEUTROABS 4.7  --   --   --   --   --  4.0  --   HGB 4.9*   < > 6.3* 6.5* 8.8* 9.4* 5.8* 9.4*  HCT 16.1*   < > 19.1* 20.7* 26.0* 28.0* 18.7* 28.7*  MCV 100.0  --  94.6  --   --  91.2 97.4  --   PLT 278  --  256  --   --  247 172  --    < > = values in this interval not displayed.   Basic Metabolic Panel: Recent Labs  Lab 08/12/22 1501 08/12/22 1530 08/13/22 0230 08/13/22 1134  NA 136 135 135 131*  K 3.3* 3.3* 3.6 5.5*  CL 94* 89* 92* 92*  CO2 29  --  28  --   GLUCOSE 134* 143* 85 97  BUN 35* 39* 43* 61*  CREATININE 4.27* 4.50* 4.98* 5.80*  CALCIUM 7.9*  --  7.9*  --    Liver Function Tests: Recent Labs  Lab 08/12/22 1501 08/13/22 0230  AST 30 27  ALT 18 18  ALKPHOS 110 114  BILITOT 0.4 0.5  PROT 6.8 6.7  ALBUMIN 2.9* 2.7*   CBG: Recent Labs  Lab 08/13/22 0757 08/14/22 0810  GLUCAP 89 82    Discharge time spent: greater than 30 minutes.  Signed: Berle Mull, MD [facility] 08/14/2022

## 2022-08-16 LAB — TYPE AND SCREEN
ABO/RH(D): B POS
Antibody Screen: NEGATIVE
Unit division: 0
Unit division: 0
Unit division: 0
Unit division: 0
Unit division: 0
Unit division: 0

## 2022-08-16 LAB — BPAM RBC
Blood Product Expiration Date: 202311062359
Blood Product Expiration Date: 202311122359
Blood Product Expiration Date: 202311122359
Blood Product Expiration Date: 202311122359
Blood Product Expiration Date: 202311172359
Blood Product Expiration Date: 202311172359
ISSUE DATE / TIME: 202310130817
ISSUE DATE / TIME: 202310281639
ISSUE DATE / TIME: 202310282011
ISSUE DATE / TIME: 202310290413
ISSUE DATE / TIME: 202310290932
ISSUE DATE / TIME: 202310291446
Unit Type and Rh: 7300
Unit Type and Rh: 7300
Unit Type and Rh: 7300
Unit Type and Rh: 7300
Unit Type and Rh: 7300
Unit Type and Rh: 7300

## 2022-08-31 IMAGING — CT CT ANGIO CHEST
2 of 7 series · 18 of 46 positions shown · IV contrast (APPLIED)
Comparison: Chest radiograph dated 03/13/2022. CTA chest dated
10/05/2021.

CLINICAL DATA: Shortness of breath, chest pain, on dialysis

EXAM:
CT ANGIOGRAPHY CHEST WITH CONTRAST
TECHNIQUE: Multidetector CT imaging of the chest was performed using the
standard protocol during bolus administration of intravenous
contrast. Multiplanar CT image reconstructions and MIPs were
obtained to evaluate the vascular anatomy.

[Series 7: thins · axial · 0.67mm/px · z∈[-332,-36]mm · 15 of 477 slices shown]
[im 27/477  lung]
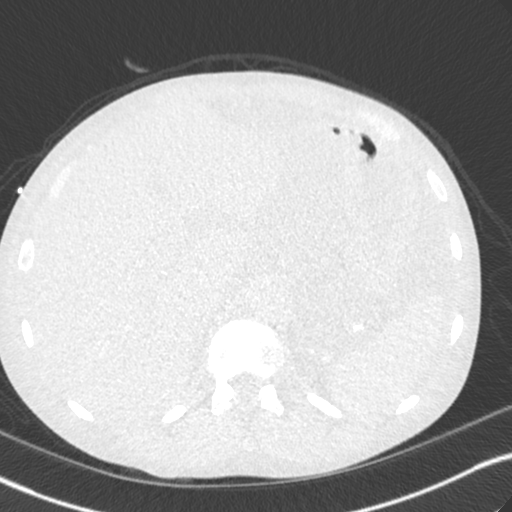
[im 53/477  soft-tissue]
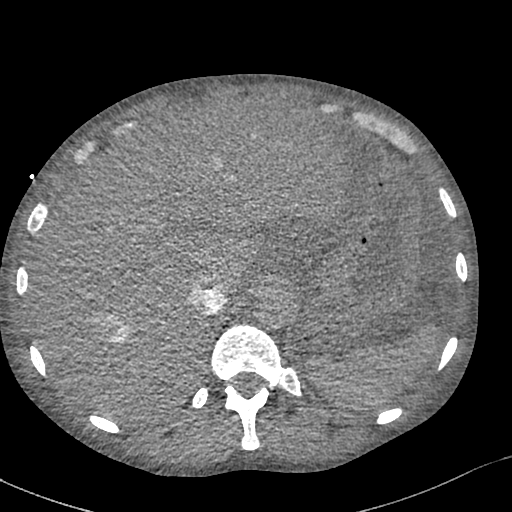
[im 80/477  lung]
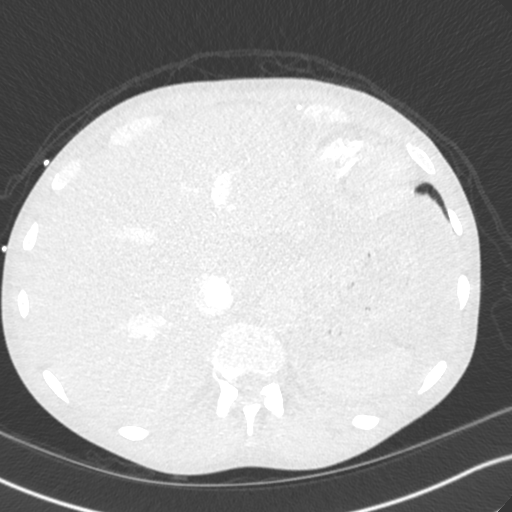
[im 106/477  soft-tissue]
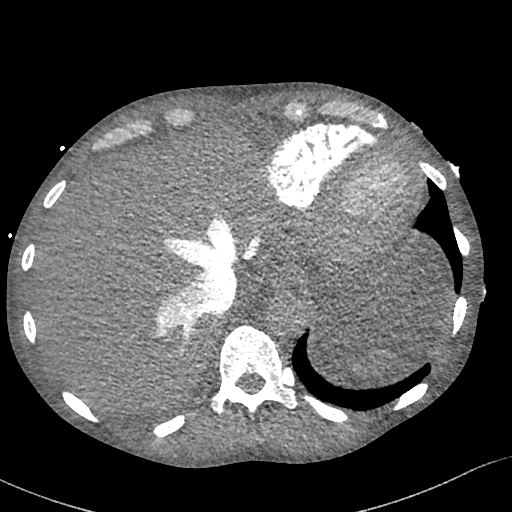
[im 159/477  lung]
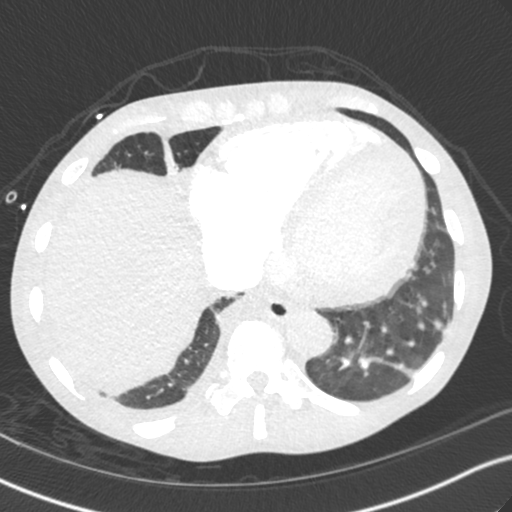
[im 186/477  soft-tissue]
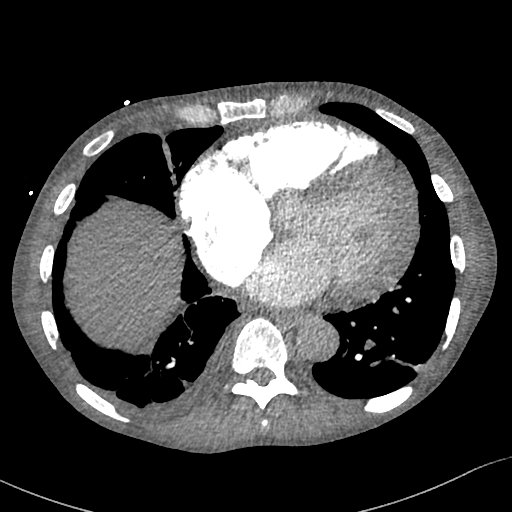
[im 212/477  lung]
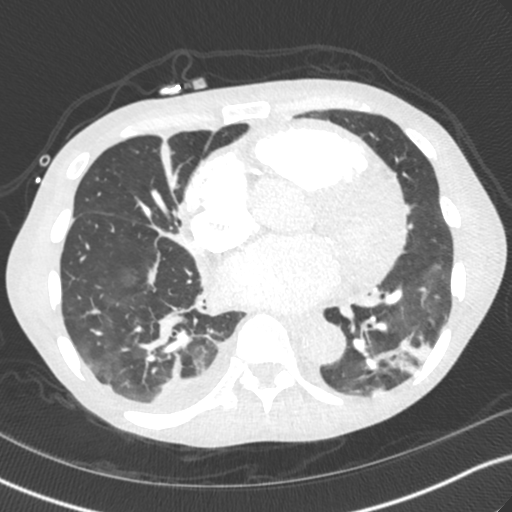
[im 239/477  soft-tissue]
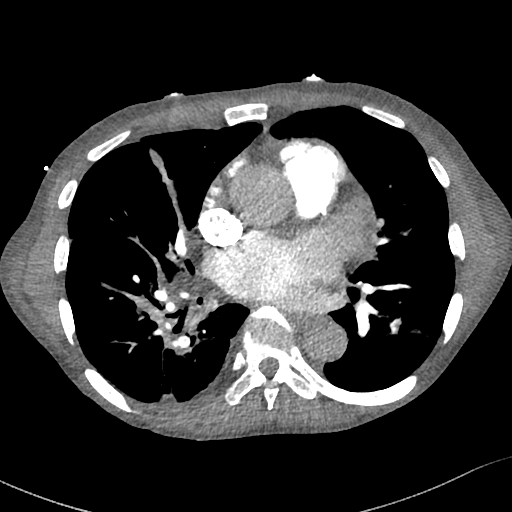
[im 265/477  lung]
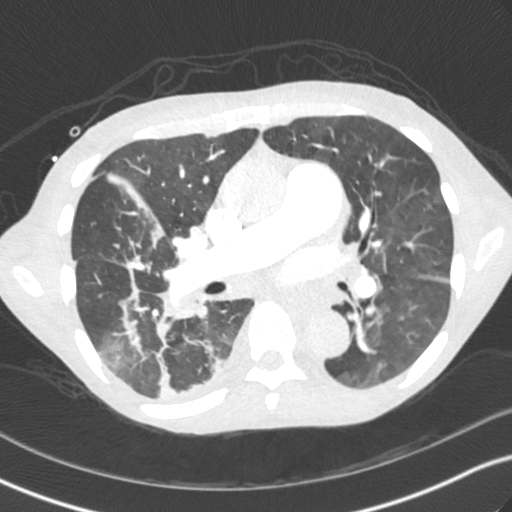
[im 291/477  soft-tissue]
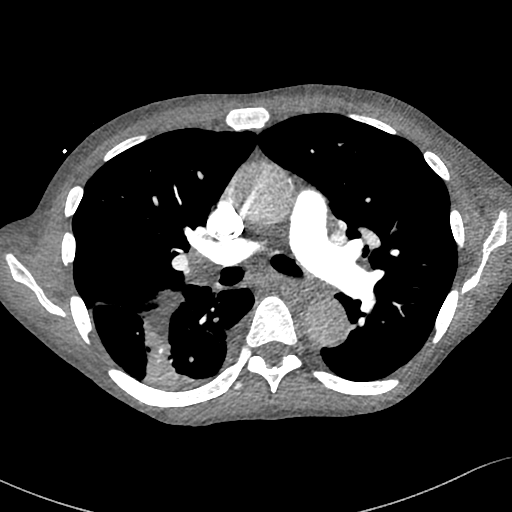
[im 318/477  lung]
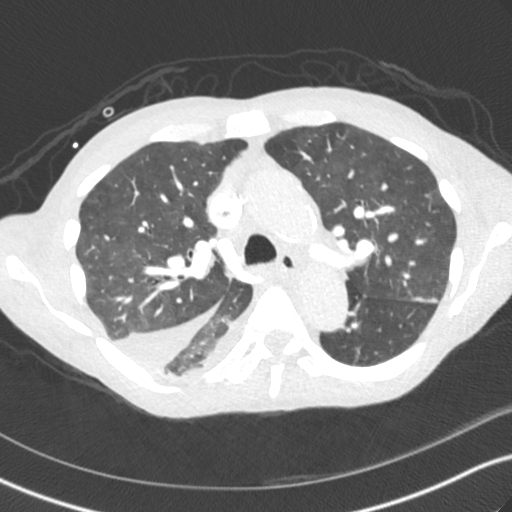
[im 371/477  soft-tissue]
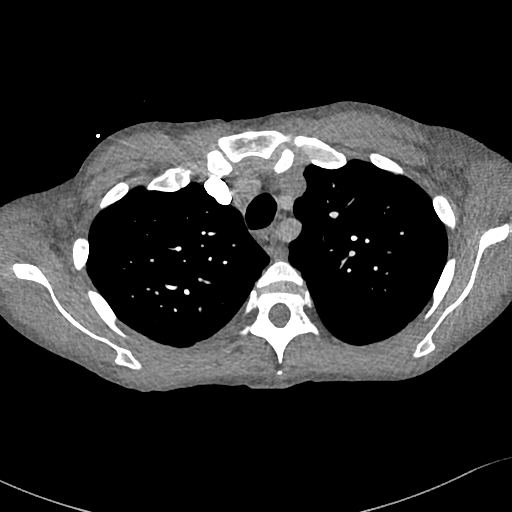
[im 397/477  lung]
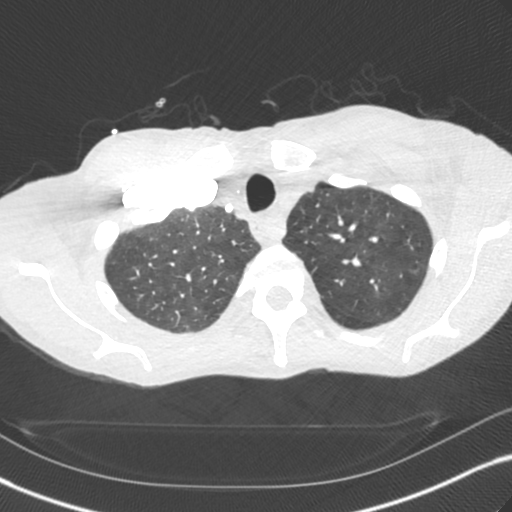
[im 424/477  soft-tissue]
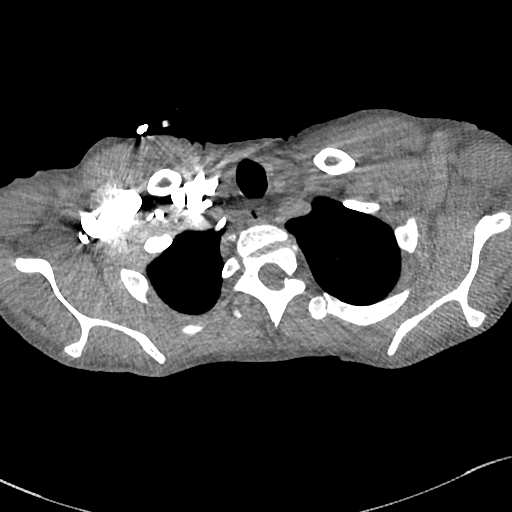
[im 450/477  lung]
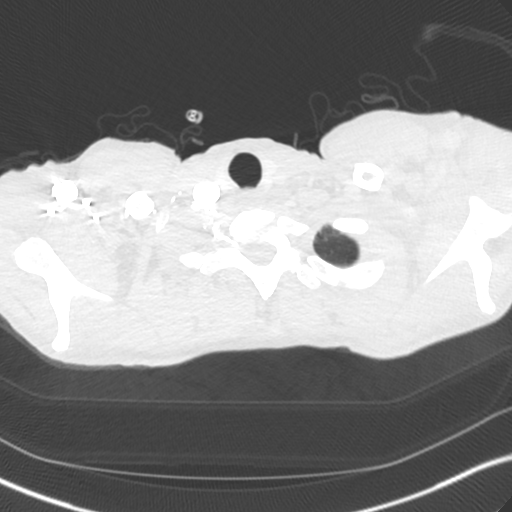

[Series 8: cor · coronal · 0.68mm/px · 3 of 123 slices shown]
[im 31/123  soft-tissue]
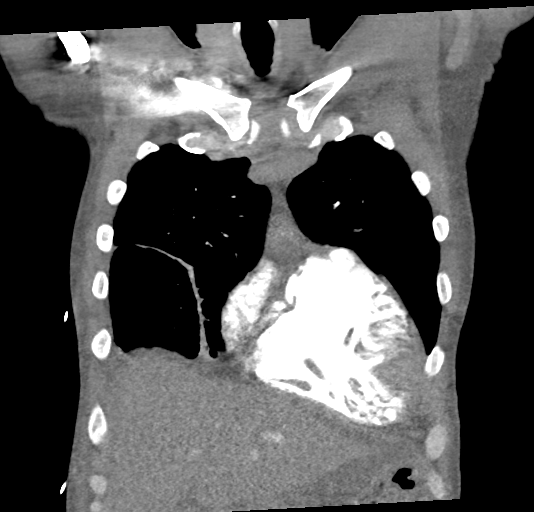
[im 62/123  soft-tissue]
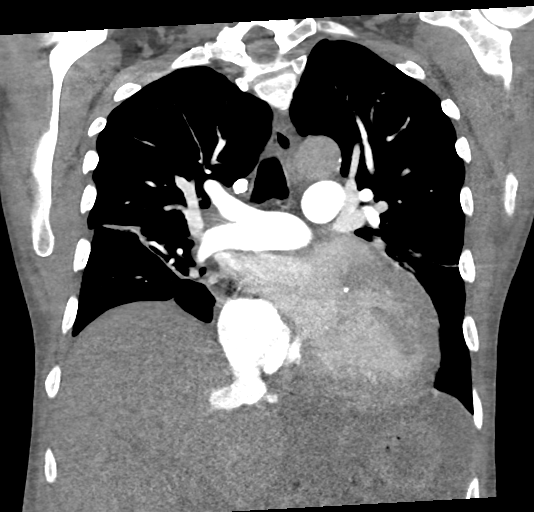
[im 92/123  soft-tissue]
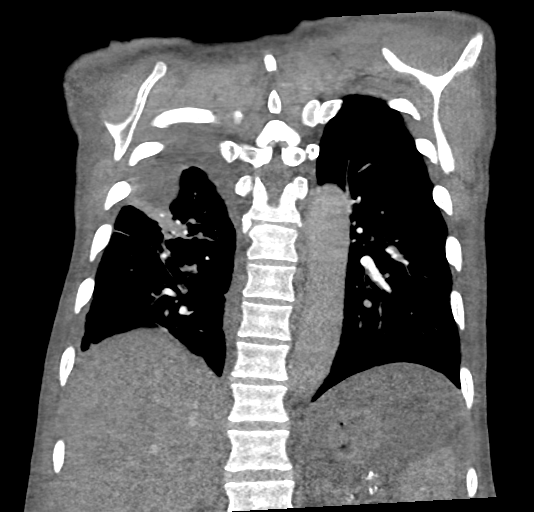

[18 of 46 positions shown; findings below may reference images not displayed]

RADIATION DOSE REDUCTION: This exam was performed according to the
departmental dose-optimization program which includes automated
exposure control, adjustment of the mA and/or kV according to
patient size and/or use of iterative reconstruction technique.

CONTRAST:  75mL OMNIPAQUE IOHEXOL 350 MG/ML SOLN
FINDINGS: Cardiovascular: Satisfactory opacification of the bilateral
pulmonary arteries to the segmental level. No evidence of pulmonary
embolism.

Study is not tailored for evaluation of the thoracic aorta. No
evidence of thoracic aortic aneurysm. Atherosclerotic calcifications
of the aortic arch.

Mild cardiomegaly.  No pericardial effusion.

Three vessel coronary atherosclerosis.

Mediastinum/Nodes: Small mediastinal lymph nodes which do not meet
pathologic CT size criteria.

Visualized thyroid is unremarkable.

Lungs/Pleura: Scattered areas of stable linear/patchy scarring in
the lungs bilaterally, right lower lobe predominant. Mild lingular
atelectasis.

Faint ground-glass opacity/mosaic attenuation in the lungs
bilaterally, similar to the prior, favoring mild interstitial edema.
No focal consolidation.

No suspicious pulmonary nodules.

Small right pleural effusion, including along the right major
fissure.

No pneumothorax.

Upper Abdomen: Visualized upper abdomen is notable for upper
abdominal ascites. Loop of bowel beneath the anterior abdominal wall
(series 5/image 167) when correlating with prior CT abdomen/pelvis.

Musculoskeletal: Visualized osseous structures are within normal
limits.

Review of the MIP images confirms the above findings.
IMPRESSION: No evidence of pulmonary embolism.

Cardiomegaly with mild interstitial edema and small right pleural
effusion.

Upper abdominal ascites.

Aortic Atherosclerosis ([id number]).

## 2022-09-17 ENCOUNTER — Inpatient Hospital Stay (HOSPITAL_COMMUNITY)
Admission: EM | Admit: 2022-09-17 | Discharge: 2022-09-22 | DRG: 208 | Disposition: A | Discharge status: Home / Self Care | Payer: Medicare Other | Attending: Family Medicine | Admitting: Family Medicine

## 2022-09-17 DIAGNOSIS — Z20822 Contact with and (suspected) exposure to covid-19: Secondary | ICD-10-CM | POA: Diagnosis present

## 2022-09-17 DIAGNOSIS — J121 Respiratory syncytial virus pneumonia: Secondary | ICD-10-CM | POA: Diagnosis not present

## 2022-09-17 DIAGNOSIS — F419 Anxiety disorder, unspecified: Secondary | ICD-10-CM | POA: Diagnosis present

## 2022-09-17 DIAGNOSIS — I160 Hypertensive urgency: Secondary | ICD-10-CM | POA: Diagnosis present

## 2022-09-17 DIAGNOSIS — D631 Anemia in chronic kidney disease: Secondary | ICD-10-CM | POA: Diagnosis present

## 2022-09-17 DIAGNOSIS — Z87891 Personal history of nicotine dependence: Secondary | ICD-10-CM

## 2022-09-17 DIAGNOSIS — I5033 Acute on chronic diastolic (congestive) heart failure: Secondary | ICD-10-CM | POA: Diagnosis present

## 2022-09-17 DIAGNOSIS — Z1152 Encounter for screening for COVID-19: Secondary | ICD-10-CM

## 2022-09-17 DIAGNOSIS — E872 Acidosis, unspecified: Secondary | ICD-10-CM | POA: Diagnosis present

## 2022-09-17 DIAGNOSIS — Z992 Dependence on renal dialysis: Secondary | ICD-10-CM

## 2022-09-17 DIAGNOSIS — G928 Other toxic encephalopathy: Secondary | ICD-10-CM | POA: Diagnosis present

## 2022-09-17 DIAGNOSIS — M898X9 Other specified disorders of bone, unspecified site: Secondary | ICD-10-CM | POA: Diagnosis present

## 2022-09-17 DIAGNOSIS — Z85038 Personal history of other malignant neoplasm of large intestine: Secondary | ICD-10-CM

## 2022-09-17 DIAGNOSIS — I4891 Unspecified atrial fibrillation: Secondary | ICD-10-CM | POA: Diagnosis present

## 2022-09-17 DIAGNOSIS — F1910 Other psychoactive substance abuse, uncomplicated: Secondary | ICD-10-CM | POA: Diagnosis present

## 2022-09-17 DIAGNOSIS — E871 Hypo-osmolality and hyponatremia: Secondary | ICD-10-CM | POA: Diagnosis present

## 2022-09-17 DIAGNOSIS — R079 Chest pain, unspecified: Secondary | ICD-10-CM

## 2022-09-17 DIAGNOSIS — R0602 Shortness of breath: Principal | ICD-10-CM

## 2022-09-17 DIAGNOSIS — J9601 Acute respiratory failure with hypoxia: Secondary | ICD-10-CM | POA: Diagnosis present

## 2022-09-17 DIAGNOSIS — Z841 Family history of disorders of kidney and ureter: Secondary | ICD-10-CM

## 2022-09-17 DIAGNOSIS — I132 Hypertensive heart and chronic kidney disease with heart failure and with stage 5 chronic kidney disease, or end stage renal disease: Secondary | ICD-10-CM | POA: Diagnosis present

## 2022-09-17 DIAGNOSIS — F32A Depression, unspecified: Secondary | ICD-10-CM | POA: Diagnosis present

## 2022-09-17 DIAGNOSIS — Z79899 Other long term (current) drug therapy: Secondary | ICD-10-CM

## 2022-09-17 DIAGNOSIS — R0603 Acute respiratory distress: Secondary | ICD-10-CM | POA: Diagnosis present

## 2022-09-17 DIAGNOSIS — N186 End stage renal disease: Secondary | ICD-10-CM | POA: Diagnosis present

## 2022-09-17 DIAGNOSIS — E861 Hypovolemia: Secondary | ICD-10-CM | POA: Diagnosis present

## 2022-09-17 DIAGNOSIS — E8770 Fluid overload, unspecified: Secondary | ICD-10-CM

## 2022-09-17 DIAGNOSIS — N400 Enlarged prostate without lower urinary tract symptoms: Secondary | ICD-10-CM | POA: Diagnosis present

## 2022-09-17 DIAGNOSIS — J189 Pneumonia, unspecified organism: Secondary | ICD-10-CM

## 2022-09-17 DIAGNOSIS — Z8249 Family history of ischemic heart disease and other diseases of the circulatory system: Secondary | ICD-10-CM

## 2022-09-17 DIAGNOSIS — K219 Gastro-esophageal reflux disease without esophagitis: Secondary | ICD-10-CM | POA: Diagnosis present

## 2022-09-17 DIAGNOSIS — I2489 Other forms of acute ischemic heart disease: Secondary | ICD-10-CM | POA: Diagnosis present

## 2022-09-17 DIAGNOSIS — D500 Iron deficiency anemia secondary to blood loss (chronic): Secondary | ICD-10-CM | POA: Diagnosis present

## 2022-09-17 DIAGNOSIS — R4182 Altered mental status, unspecified: Secondary | ICD-10-CM

## 2022-09-17 MED ORDER — NITROGLYCERIN IN D5W 200-5 MCG/ML-% IV SOLN
0.0000 ug/min | INTRAVENOUS | Status: DC
  Administered 2022-09-18: 5 ug/min via INTRAVENOUS
  Filled 2022-09-17: qty 250

## 2022-09-17 MED ORDER — ALBUTEROL SULFATE (2.5 MG/3ML) 0.083% IN NEBU
15.0000 mg | INHALATION_SOLUTION | RESPIRATORY_TRACT | Status: AC
  Administered 2022-09-17: 15 mg via RESPIRATORY_TRACT
  Filled 2022-09-17: qty 18

## 2022-09-17 NOTE — ED Triage Notes (Signed)
Pt arrived by EMS from home complaining of shortness of breath. Not tolerating CPAP for EMS  Rales in all fields.   Did not receive full dialysis treatment yesterday due to cramping   0.4 nitro tab given by EMS   202/140 102 100 non-rebreather 15L   20 R Hand

## 2022-09-17 NOTE — ED Triage Notes (Signed)
Pt refusing to attempt bipap on arrival  Pt also arguing with staff about VS monitoring and work up.

## 2022-09-18 ENCOUNTER — Inpatient Hospital Stay (HOSPITAL_COMMUNITY): Payer: Medicare Other

## 2022-09-18 ENCOUNTER — Emergency Department (HOSPITAL_COMMUNITY): Payer: Medicare Other

## 2022-09-18 DIAGNOSIS — J189 Pneumonia, unspecified organism: Secondary | ICD-10-CM

## 2022-09-18 DIAGNOSIS — Z85038 Personal history of other malignant neoplasm of large intestine: Secondary | ICD-10-CM | POA: Diagnosis not present

## 2022-09-18 DIAGNOSIS — E861 Hypovolemia: Secondary | ICD-10-CM | POA: Diagnosis present

## 2022-09-18 DIAGNOSIS — R7889 Finding of other specified substances, not normally found in blood: Secondary | ICD-10-CM

## 2022-09-18 DIAGNOSIS — E871 Hypo-osmolality and hyponatremia: Secondary | ICD-10-CM | POA: Diagnosis present

## 2022-09-18 DIAGNOSIS — N186 End stage renal disease: Secondary | ICD-10-CM | POA: Diagnosis present

## 2022-09-18 DIAGNOSIS — I2489 Other forms of acute ischemic heart disease: Secondary | ICD-10-CM | POA: Diagnosis present

## 2022-09-18 DIAGNOSIS — E8770 Fluid overload, unspecified: Secondary | ICD-10-CM | POA: Diagnosis not present

## 2022-09-18 DIAGNOSIS — R0602 Shortness of breath: Secondary | ICD-10-CM | POA: Diagnosis present

## 2022-09-18 DIAGNOSIS — J9601 Acute respiratory failure with hypoxia: Secondary | ICD-10-CM | POA: Diagnosis present

## 2022-09-18 DIAGNOSIS — G928 Other toxic encephalopathy: Secondary | ICD-10-CM | POA: Diagnosis present

## 2022-09-18 DIAGNOSIS — R0603 Acute respiratory distress: Secondary | ICD-10-CM | POA: Diagnosis present

## 2022-09-18 DIAGNOSIS — R4182 Altered mental status, unspecified: Secondary | ICD-10-CM

## 2022-09-18 DIAGNOSIS — Z992 Dependence on renal dialysis: Secondary | ICD-10-CM

## 2022-09-18 DIAGNOSIS — J121 Respiratory syncytial virus pneumonia: Secondary | ICD-10-CM | POA: Diagnosis present

## 2022-09-18 DIAGNOSIS — D631 Anemia in chronic kidney disease: Secondary | ICD-10-CM | POA: Diagnosis present

## 2022-09-18 DIAGNOSIS — E872 Acidosis, unspecified: Secondary | ICD-10-CM | POA: Diagnosis present

## 2022-09-18 DIAGNOSIS — Z20822 Contact with and (suspected) exposure to covid-19: Secondary | ICD-10-CM | POA: Diagnosis present

## 2022-09-18 DIAGNOSIS — N400 Enlarged prostate without lower urinary tract symptoms: Secondary | ICD-10-CM | POA: Diagnosis present

## 2022-09-18 DIAGNOSIS — I5033 Acute on chronic diastolic (congestive) heart failure: Secondary | ICD-10-CM | POA: Diagnosis present

## 2022-09-18 DIAGNOSIS — K219 Gastro-esophageal reflux disease without esophagitis: Secondary | ICD-10-CM | POA: Diagnosis present

## 2022-09-18 DIAGNOSIS — I160 Hypertensive urgency: Secondary | ICD-10-CM | POA: Diagnosis present

## 2022-09-18 DIAGNOSIS — F419 Anxiety disorder, unspecified: Secondary | ICD-10-CM | POA: Diagnosis present

## 2022-09-18 DIAGNOSIS — I132 Hypertensive heart and chronic kidney disease with heart failure and with stage 5 chronic kidney disease, or end stage renal disease: Secondary | ICD-10-CM | POA: Diagnosis present

## 2022-09-18 DIAGNOSIS — D500 Iron deficiency anemia secondary to blood loss (chronic): Secondary | ICD-10-CM | POA: Diagnosis present

## 2022-09-18 DIAGNOSIS — Z87891 Personal history of nicotine dependence: Secondary | ICD-10-CM | POA: Diagnosis not present

## 2022-09-18 DIAGNOSIS — F32A Depression, unspecified: Secondary | ICD-10-CM | POA: Diagnosis present

## 2022-09-18 DIAGNOSIS — M898X9 Other specified disorders of bone, unspecified site: Secondary | ICD-10-CM | POA: Diagnosis present

## 2022-09-18 DIAGNOSIS — I4891 Unspecified atrial fibrillation: Secondary | ICD-10-CM | POA: Diagnosis present

## 2022-09-18 LAB — CBC
HCT: 32 % — ABNORMAL LOW (ref 39.0–52.0)
Hemoglobin: 10.4 g/dL — ABNORMAL LOW (ref 13.0–17.0)
MCH: 29 pg (ref 26.0–34.0)
MCHC: 32.5 g/dL (ref 30.0–36.0)
MCV: 89.1 fL (ref 80.0–100.0)
Platelets: 294 10*3/uL (ref 150–400)
RBC: 3.59 MIL/uL — ABNORMAL LOW (ref 4.22–5.81)
RDW: 17.2 % — ABNORMAL HIGH (ref 11.5–15.5)
WBC: 12.1 10*3/uL — ABNORMAL HIGH (ref 4.0–10.5)
nRBC: 0 % (ref 0.0–0.2)

## 2022-09-18 LAB — GLUCOSE, CAPILLARY
Glucose-Capillary: 100 mg/dL — ABNORMAL HIGH (ref 70–99)
Glucose-Capillary: 87 mg/dL (ref 70–99)
Glucose-Capillary: 89 mg/dL (ref 70–99)
Glucose-Capillary: 93 mg/dL (ref 70–99)
Glucose-Capillary: 95 mg/dL (ref 70–99)

## 2022-09-18 LAB — PHOSPHORUS
Phosphorus: 6.4 mg/dL — ABNORMAL HIGH (ref 2.5–4.6)
Phosphorus: 6 mg/dL — ABNORMAL HIGH (ref 2.5–4.6)

## 2022-09-18 LAB — LACTIC ACID, PLASMA
Lactic Acid, Venous: 0.9 mmol/L (ref 0.5–1.9)
Lactic Acid, Venous: 1 mmol/L (ref 0.5–1.9)

## 2022-09-18 LAB — BRAIN NATRIURETIC PEPTIDE: B Natriuretic Peptide: >4500 pg/mL — ABNORMAL HIGH (ref 0.0–100.0)

## 2022-09-18 LAB — MRSA NEXT GEN BY PCR, NASAL: MRSA by PCR Next Gen: NOT DETECTED

## 2022-09-18 LAB — RESP PANEL BY RT-PCR (FLU A&B, COVID) ARPGX2
Influenza A by PCR: NEGATIVE
Influenza B by PCR: NEGATIVE
SARS Coronavirus 2 by RT PCR: NEGATIVE

## 2022-09-18 LAB — COMPREHENSIVE METABOLIC PANEL
ALT: 18 U/L (ref 0–44)
ALT: 18 U/L (ref 0–44)
AST: 24 U/L (ref 15–41)
AST: 27 U/L (ref 15–41)
Albumin: 2.9 g/dL — ABNORMAL LOW (ref 3.5–5.0)
Albumin: 3.1 g/dL — ABNORMAL LOW (ref 3.5–5.0)
Alkaline Phosphatase: 150 U/L — ABNORMAL HIGH (ref 38–126)
Alkaline Phosphatase: 169 U/L — ABNORMAL HIGH (ref 38–126)
Anion gap: 17 — ABNORMAL HIGH (ref 5–15)
Anion gap: 17 — ABNORMAL HIGH (ref 5–15)
BUN: 78 mg/dL — ABNORMAL HIGH (ref 8–23)
BUN: 80 mg/dL — ABNORMAL HIGH (ref 8–23)
CO2: 23 mmol/L (ref 22–32)
CO2: 25 mmol/L (ref 22–32)
Calcium: 8.2 mg/dL — ABNORMAL LOW (ref 8.9–10.3)
Calcium: 8.4 mg/dL — ABNORMAL LOW (ref 8.9–10.3)
Chloride: 91 mmol/L — ABNORMAL LOW (ref 98–111)
Chloride: 88 mmol/L — ABNORMAL LOW (ref 98–111)
Creatinine, Ser: 7.81 mg/dL — ABNORMAL HIGH (ref 0.61–1.24)
Creatinine, Ser: 7.87 mg/dL — ABNORMAL HIGH (ref 0.61–1.24)
GFR, Estimated: 7 mL/min — ABNORMAL LOW (ref >60)
GFR, Estimated: 7 mL/min — ABNORMAL LOW (ref >60)
Glucose, Bld: 125 mg/dL — ABNORMAL HIGH (ref 70–99)
Glucose, Bld: 127 mg/dL — ABNORMAL HIGH (ref 70–99)
Potassium: 4.5 mmol/L (ref 3.5–5.1)
Potassium: 4.2 mmol/L (ref 3.5–5.1)
Sodium: 131 mmol/L — ABNORMAL LOW (ref 135–145)
Sodium: 130 mmol/L — ABNORMAL LOW (ref 135–145)
Total Bilirubin: 0.8 mg/dL (ref 0.3–1.2)
Total Bilirubin: 0.8 mg/dL (ref 0.3–1.2)
Total Protein: 7.6 g/dL (ref 6.5–8.1)
Total Protein: 8.3 g/dL — ABNORMAL HIGH (ref 6.5–8.1)

## 2022-09-18 LAB — RESPIRATORY PANEL BY PCR

## 2022-09-18 LAB — CBC WITH DIFFERENTIAL/PLATELET
Abs Immature Granulocytes: 0.1 10*3/uL — ABNORMAL HIGH (ref 0.00–0.07)
Basophils Absolute: 0 10*3/uL (ref 0.0–0.1)
Basophils Relative: 0 %
Eosinophils Absolute: 0 10*3/uL (ref 0.0–0.5)
Eosinophils Relative: 0 %
HCT: 34.8 % — ABNORMAL LOW (ref 39.0–52.0)
Hemoglobin: 11.6 g/dL — ABNORMAL LOW (ref 13.0–17.0)
Immature Granulocytes: 1 %
Lymphocytes Relative: 2 %
Lymphs Abs: 0.3 10*3/uL — ABNORMAL LOW (ref 0.7–4.0)
MCH: 29.7 pg (ref 26.0–34.0)
MCHC: 33.3 g/dL (ref 30.0–36.0)
MCV: 89 fL (ref 80.0–100.0)
Monocytes Absolute: 1.2 10*3/uL — ABNORMAL HIGH (ref 0.1–1.0)
Monocytes Relative: 10 %
Neutro Abs: 11.3 10*3/uL — ABNORMAL HIGH (ref 1.7–7.7)
Neutrophils Relative %: 87 %
Platelets: 312 10*3/uL (ref 150–400)
RBC: 3.91 MIL/uL — ABNORMAL LOW (ref 4.22–5.81)
RDW: 17.2 % — ABNORMAL HIGH (ref 11.5–15.5)
WBC: 12.9 10*3/uL — ABNORMAL HIGH (ref 4.0–10.5)
nRBC: 0 % (ref 0.0–0.2)

## 2022-09-18 LAB — I-STAT ARTERIAL BLOOD GAS, ED
Acid-Base Excess: 0 mmol/L (ref 0.0–2.0)
Bicarbonate: 27 mmol/L (ref 20.0–28.0)
Calcium, Ion: 1.04 mmol/L — ABNORMAL LOW (ref 1.15–1.40)
HCT: 38 % — ABNORMAL LOW (ref 39.0–52.0)
Hemoglobin: 12.9 g/dL — ABNORMAL LOW (ref 13.0–17.0)
O2 Saturation: 96 %
Patient temperature: 98.6
Potassium: 4.2 mmol/L (ref 3.5–5.1)
Sodium: 130 mmol/L — ABNORMAL LOW (ref 135–145)
TCO2: 29 mmol/L (ref 22–32)
pCO2 arterial: 50.3 mmHg — ABNORMAL HIGH (ref 32–48)
pH, Arterial: 7.338 — ABNORMAL LOW (ref 7.35–7.45)
pO2, Arterial: 86 mmHg (ref 83–108)

## 2022-09-18 LAB — TROPONIN I (HIGH SENSITIVITY)
Troponin I (High Sensitivity): 126 ng/L (ref <18)
Troponin I (High Sensitivity): 124 ng/L (ref <18)

## 2022-09-18 LAB — MAGNESIUM: Magnesium: 2.3 mg/dL (ref 1.7–2.4)

## 2022-09-18 LAB — I-STAT VENOUS BLOOD GAS, ED
Acid-Base Excess: 1 mmol/L (ref 0.0–2.0)
Bicarbonate: 27.5 mmol/L (ref 20.0–28.0)
Calcium, Ion: 0.99 mmol/L — ABNORMAL LOW (ref 1.15–1.40)
HCT: 35 % — ABNORMAL LOW (ref 39.0–52.0)
Hemoglobin: 11.9 g/dL — ABNORMAL LOW (ref 13.0–17.0)
O2 Saturation: 96 %
Potassium: 4.3 mmol/L (ref 3.5–5.1)
Sodium: 131 mmol/L — ABNORMAL LOW (ref 135–145)
TCO2: 29 mmol/L (ref 22–32)
pCO2, Ven: 52.8 mmHg (ref 44–60)
pH, Ven: 7.324 (ref 7.25–7.43)
pO2, Ven: 93 mmHg — ABNORMAL HIGH (ref 32–45)

## 2022-09-18 LAB — ECHOCARDIOGRAM COMPLETE
AR max vel: 3.38 cm2
AV Area VTI: 3.22 cm2
AV Area mean vel: 3.07 cm2
AV Mean grad: 4 mmHg
AV Peak grad: 7.2 mmHg
Ao pk vel: 1.34 m/s
Area-P 1/2: 4.19 cm2
Calc EF: 46.9 %
Height: 73 in
S' Lateral: 4.2 cm
Single Plane A2C EF: 42.9 %
Single Plane A4C EF: 53.3 %
Weight: 2359.8 oz

## 2022-09-18 LAB — PROCALCITONIN: Procalcitonin: 0.66 ng/mL

## 2022-09-18 LAB — AMMONIA: Ammonia: 23 umol/L (ref 9–35)

## 2022-09-18 LAB — PROTIME-INR
INR: 1.3 — ABNORMAL HIGH (ref 0.8–1.2)
Prothrombin Time: 15.9 seconds — ABNORMAL HIGH (ref 11.4–15.2)

## 2022-09-18 LAB — HEPATITIS B SURFACE ANTIGEN: Hepatitis B Surface Ag: NONREACTIVE

## 2022-09-18 LAB — ETHANOL: Alcohol, Ethyl (B): <10 mg/dL (ref <10)

## 2022-09-18 MED ORDER — PROPOFOL 1000 MG/100ML IV EMUL: 5.0000 ug/kg/min | INTRAVENOUS | Status: DC

## 2022-09-18 MED ORDER — ORAL CARE MOUTH RINSE
15.0000 mL | OROMUCOSAL | Status: DC
  Administered 2022-09-18 – 2022-09-19 (×15): 15 mL via OROMUCOSAL

## 2022-09-18 MED ORDER — FENTANYL 2500MCG IN NS 250ML (10MCG/ML) PREMIX INFUSION
50.0000 ug/h | INTRAVENOUS | Status: DC
  Administered 2022-09-18 – 2022-09-19 (×2): 200 ug/h via INTRAVENOUS
  Filled 2022-09-18 (×2): qty 250

## 2022-09-18 MED ORDER — PROPOFOL 1000 MG/100ML IV EMUL
0.0000 ug/kg/min | INTRAVENOUS | Status: DC
  Administered 2022-09-18: 25 ug/kg/min via INTRAVENOUS
  Administered 2022-09-18: 50 ug/kg/min via INTRAVENOUS
  Administered 2022-09-18: 15 ug/kg/min via INTRAVENOUS
  Filled 2022-09-18 (×3): qty 100

## 2022-09-18 MED ORDER — SODIUM CHLORIDE 0.9 % IV SOLN
2.0000 g | INTRAVENOUS | Status: DC
  Administered 2022-09-18 – 2022-09-20 (×3): 2 g via INTRAVENOUS
  Filled 2022-09-18 (×3): qty 20

## 2022-09-18 MED ORDER — SODIUM CHLORIDE 0.9 % IV SOLN: 2.0000 g | Freq: Once | INTRAVENOUS | Status: DC

## 2022-09-18 MED ORDER — PROPOFOL 1000 MG/100ML IV EMUL
INTRAVENOUS | Status: AC
  Administered 2022-09-18: 10 ug/kg/min
  Filled 2022-09-18: qty 100

## 2022-09-18 MED ORDER — ORAL CARE MOUTH RINSE: 15.0000 mL | OROMUCOSAL | Status: DC | PRN

## 2022-09-18 MED ORDER — POLYETHYLENE GLYCOL 3350 17 G PO PACK
17.0000 g | PACK | Freq: Every day | ORAL | Status: DC
  Administered 2022-09-18 – 2022-09-21 (×3): 17 g
  Filled 2022-09-18 (×3): qty 1

## 2022-09-18 MED ORDER — SODIUM CHLORIDE 0.9 % IV SOLN
500.0000 mg | Freq: Once | INTRAVENOUS | Status: AC
  Administered 2022-09-18: 500 mg via INTRAVENOUS
  Filled 2022-09-18: qty 5

## 2022-09-18 MED ORDER — FAMOTIDINE 20 MG PO TABS
20.0000 mg | ORAL_TABLET | Freq: Two times a day (BID) | ORAL | Status: DC
  Administered 2022-09-18 – 2022-09-21 (×7): 20 mg
  Filled 2022-09-18 (×7): qty 1

## 2022-09-18 MED ORDER — SODIUM CHLORIDE 0.9 % IV SOLN: 2.0000 g | INTRAVENOUS | Status: DC

## 2022-09-18 MED ORDER — FENTANYL BOLUS VIA INFUSION
50.0000 ug | INTRAVENOUS | Status: DC | PRN
  Administered 2022-09-18 (×2): 75 ug via INTRAVENOUS
  Administered 2022-09-18 (×2): 50 ug via INTRAVENOUS

## 2022-09-18 MED ORDER — FENTANYL 2500MCG IN NS 250ML (10MCG/ML) PREMIX INFUSION
INTRAVENOUS | Status: AC
  Administered 2022-09-18: 25 ug/h
  Filled 2022-09-18: qty 250

## 2022-09-18 MED ORDER — HEPARIN SODIUM (PORCINE) 5000 UNIT/ML IJ SOLN
5000.0000 [IU] | Freq: Three times a day (TID) | INTRAMUSCULAR | Status: DC
  Administered 2022-09-18 – 2022-09-21 (×11): 5000 [IU] via SUBCUTANEOUS
  Filled 2022-09-18 (×12): qty 1

## 2022-09-18 MED ORDER — IPRATROPIUM-ALBUTEROL 0.5-2.5 (3) MG/3ML IN SOLN: 3.0000 mL | RESPIRATORY_TRACT | Status: DC | PRN

## 2022-09-18 MED ORDER — HYDRALAZINE HCL 20 MG/ML IJ SOLN: 10.0000 mg | Freq: Four times a day (QID) | INTRAMUSCULAR | Status: DC | PRN

## 2022-09-18 MED ORDER — CHLORHEXIDINE GLUCONATE CLOTH 2 % EX PADS
6.0000 | MEDICATED_PAD | Freq: Every day | CUTANEOUS | Status: DC
  Administered 2022-09-18 – 2022-09-19 (×3): 6 via TOPICAL

## 2022-09-18 MED ORDER — HYDRALAZINE HCL 20 MG/ML IJ SOLN: 5.0000 mg | Freq: Four times a day (QID) | INTRAMUSCULAR | Status: DC | PRN

## 2022-09-18 MED ORDER — ORAL CARE MOUTH RINSE: 15.0000 mL | OROMUCOSAL | Status: DC

## 2022-09-18 MED ORDER — ACETAMINOPHEN 325 MG PO TABS: 650.0000 mg | ORAL_TABLET | ORAL | Status: DC | PRN

## 2022-09-18 MED ORDER — SODIUM CHLORIDE 0.9 % IV SOLN: 1.0000 g | INTRAVENOUS | Status: DC

## 2022-09-18 MED ORDER — SODIUM CHLORIDE 0.9 % IV SOLN
500.0000 mg | INTRAVENOUS | Status: DC
  Administered 2022-09-19: 500 mg via INTRAVENOUS
  Filled 2022-09-18 (×2): qty 5

## 2022-09-18 MED ORDER — FENTANYL CITRATE PF 50 MCG/ML IJ SOSY
50.0000 ug | PREFILLED_SYRINGE | Freq: Once | INTRAMUSCULAR | Status: AC
  Administered 2022-09-18: 50 ug via INTRAVENOUS

## 2022-09-18 MED ORDER — SODIUM CHLORIDE 0.9 % IV SOLN
2.0000 g | Freq: Once | INTRAVENOUS | Status: AC
  Administered 2022-09-18: 2 g via INTRAVENOUS
  Filled 2022-09-18: qty 20

## 2022-09-18 MED ORDER — ENOXAPARIN SODIUM 30 MG/0.3ML IJ SOSY: 30.0000 mg | PREFILLED_SYRINGE | Freq: Every day | INTRAMUSCULAR | Status: DC

## 2022-09-18 MED ORDER — ROCURONIUM BROMIDE 10 MG/ML (PF) SYRINGE
PREFILLED_SYRINGE | INTRAVENOUS | Status: AC
  Administered 2022-09-18: 100 mg
  Filled 2022-09-18: qty 10

## 2022-09-18 MED ORDER — ETOMIDATE 2 MG/ML IV SOLN
INTRAVENOUS | Status: AC
  Administered 2022-09-18: 20 mg
  Filled 2022-09-18: qty 10

## 2022-09-18 MED ORDER — DOCUSATE SODIUM 50 MG/5ML PO LIQD
100.0000 mg | Freq: Two times a day (BID) | ORAL | Status: DC
  Administered 2022-09-18 – 2022-09-21 (×4): 100 mg
  Filled 2022-09-18 (×5): qty 10

## 2022-09-18 NOTE — Consult Note (Signed)
Renal Service Consult Note [facility]  Timothy [name] 09/18/2022 [name], MD Requesting Physician: Dr. Sedonia Small  Reason for Consult: ESRD pt w/ SOB and vol overload HPI: The patient is a 61 y.o. year-old w/ PMH as below who presented to ED via EMS tonight c/o SOB. HD yest was cut short due to cramping. BP 202/140, on NRB in ED. We are asked to see for dialysis.   Labs and CXR are not back yet. Pt seen in ED.  "I went overboard", and "too much ice".  Looking back in his records he has been leaving dialysis 4-8 kg over his dry wt for the last 2 wks. Has not missed any HD recently.  Pt denies any CP, no fevers, chills. +cough, non productive.    ROS - denies CP, no joint pain, no HA, no blurry vision, no rash, no diarrhea, no nausea/ vomiting, no dysuria, no difficulty voiding   Past Medical History  Past Medical History:  Diagnosis Date   Acute metabolic encephalopathy 67/09/4579   Anemia of chronic kidney failure    BPH (benign prostatic hyperplasia)    Colon cancer ([place]) 2014   spouse states he had surgury for colon CA   End stage renal disease on dialysis ([place]) 2017   TTHSat   Hepatitis C    treated   Hypertension    Hypertensive crisis 04/07/2022   Hypertensive heart disease with chronic diastolic congestive heart failure ([place]) 07/17/2016   Polysubstance abuse ([place])    History of heroin and marijuana use   Thoracic aortic aneurysm ([place])    followed by Dr. Ellyn Hack   Past Surgical History  Past Surgical History:  Procedure Laterality Date   A/V FISTULAGRAM Left 12/02/2021   Procedure: A/V Fistulagram;  Surgeon: Cherre Robins, MD;  Location: [place] [facility];  Service: Cardiovascular;  Laterality: Left;   A/V FISTULAGRAM Left 04/24/2022   Procedure: A/V Fistulagram;  Surgeon: Waynetta Sandy, MD;  Location: [place] [facility];  Service: Cardiovascular;  Laterality: Left;   ABDOMINAL SURGERY     AV FISTULA PLACEMENT Left 09/19/2016    Procedure: Left arm Radiocephalic ARTERIOVENOUS (AV) FISTULA CREATION;  Surgeon: Conrad Cushing, MD;  Location: [place];  Service: Vascular;  Laterality: Left;   BASCILIC VEIN TRANSPOSITION Left 07/09/2017   Procedure: BRACHIOCEPHALIC FISTULA CREATION;  Surgeon: Conrad Valliant, MD;  Location: [place];  Service: Vascular;  Laterality: Left;   BIOPSY  07/05/2022   Procedure: BIOPSY;  Surgeon: Thornton Park, MD;  Location: [place];  Service: Gastroenterology;;   COLON SURGERY  2014   COLONOSCOPY N/A 07/12/2022   Procedure: COLONOSCOPY;  Surgeon: Doran Stabler, MD;  Location: [place];  Service: Gastroenterology;  Laterality: N/A;   COLONOSCOPY WITH PROPOFOL N/A 07/05/2022   Procedure: COLONOSCOPY WITH PROPOFOL;  Surgeon: Thornton Park, MD;  Location: [place];  Service: Gastroenterology;  Laterality: N/A;   ENTEROSCOPY N/A 07/28/2022   Procedure: ENTEROSCOPY;  Surgeon: [name], MD;  Location: [facility] ENDOSCOPY;  Service: Gastroenterology;  Laterality: N/A;   ESOPHAGOGASTRODUODENOSCOPY (EGD) WITH PROPOFOL N/A 11/05/2021   Procedure: ESOPHAGOGASTRODUODENOSCOPY (EGD) WITH PROPOFOL;  Surgeon: Carol Ada, MD;  Location: [place];  Service: Endoscopy;  Laterality: N/A;   ESOPHAGOGASTRODUODENOSCOPY (EGD) WITH PROPOFOL N/A 11/14/2021   Procedure: ESOPHAGOGASTRODUODENOSCOPY (EGD) WITH PROPOFOL;  Surgeon: [name], MD;  Location: [place];  Service: Gastroenterology;  Laterality: N/A;   ESOPHAGOGASTRODUODENOSCOPY (EGD) WITH PROPOFOL N/A 11/16/2021   Procedure: ESOPHAGOGASTRODUODENOSCOPY (EGD) WITH PROPOFOL;  Surgeon: [name], MD;  Location: [place];  Service: Gastroenterology;  Laterality: N/A;   ESOPHAGOGASTRODUODENOSCOPY (EGD) WITH PROPOFOL N/A 07/05/2022   Procedure: ESOPHAGOGASTRODUODENOSCOPY (EGD) WITH PROPOFOL;  Surgeon: Thornton Park, MD;  Location: [place];  Service: Gastroenterology;  Laterality: N/A;   ESOPHAGOGASTRODUODENOSCOPY (EGD) WITH PROPOFOL  N/A 07/12/2022   Procedure: ESOPHAGOGASTRODUODENOSCOPY (EGD) WITH PROPOFOL;  Surgeon: Doran Stabler, MD;  Location: [place];  Service: Gastroenterology;  Laterality: N/A;   ESOPHAGOGASTRODUODENOSCOPY (EGD) WITH PROPOFOL N/A 07/31/2022   Procedure: ESOPHAGOGASTRODUODENOSCOPY (EGD) WITH PROPOFOL;  Surgeon: Ladene Artist, MD;  Location: [place];  Service: Gastroenterology;  Laterality: N/A;   ESOPHAGOGASTRODUODENOSCOPY (EGD) WITH PROPOFOL N/A 08/13/2022   Procedure: ESOPHAGOGASTRODUODENOSCOPY (EGD) WITH PROPOFOL;  Surgeon: Gatha Mayer, MD;  Location: [place];  Service: Gastroenterology;  Laterality: N/A;   HEMOSTASIS CLIP PLACEMENT  11/16/2021   Procedure: HEMOSTASIS CLIP PLACEMENT;  Surgeon: [name], MD;  Location: [facility] ENDOSCOPY;  Service: Gastroenterology;;   HEMOSTASIS CLIP PLACEMENT  07/05/2022   Procedure: HEMOSTASIS CLIP PLACEMENT;  Surgeon: Thornton Park, MD;  Location: [facility] ENDOSCOPY;  Service: Gastroenterology;;   HEMOSTASIS CLIP PLACEMENT  08/13/2022   Procedure: HEMOSTASIS CLIP PLACEMENT;  Surgeon: Gatha Mayer, MD;  Location: [facility] ENDOSCOPY;  Service: Gastroenterology;;   HEMOSTASIS CONTROL  11/05/2021   Procedure: HEMOSTASIS CONTROL;  Surgeon: Carol Ada, MD;  Location: [place];  Service: Endoscopy;;   HEMOSTASIS CONTROL  11/16/2021   Procedure: HEMOSTASIS CONTROL;  Surgeon: [name], MD;  Location: [place];  Service: Gastroenterology;;   HEMOSTASIS CONTROL  07/05/2022   Procedure: HEMOSTASIS CONTROL;  Surgeon: Thornton Park, MD;  Location: [place];  Service: Gastroenterology;;   HOT HEMOSTASIS N/A 11/16/2021   Procedure: HOT HEMOSTASIS (ARGON PLASMA COAGULATION/BICAP);  Surgeon: [name], MD;  Location: [place];  Service: Gastroenterology;  Laterality: N/A;   HOT HEMOSTASIS N/A 08/13/2022   Procedure: HOT HEMOSTASIS (ARGON PLASMA COAGULATION/BICAP);  Surgeon: Gatha Mayer, MD;  Location: [place];  Service:  Gastroenterology;  Laterality: N/A;   INSERTION OF DIALYSIS CATHETER N/A 09/19/2016   Procedure: INSERTION OF TUNNELED DIALYSIS CATHETER;  Surgeon: Conrad Tabor, MD;  Location: [place];  Service: Vascular;  Laterality: N/A;   IR ANGIOGRAM SELECTIVE EACH ADDITIONAL VESSEL  11/16/2021   IR ANGIOGRAM VISCERAL SELECTIVE  11/05/2021   IR ANGIOGRAM VISCERAL SELECTIVE  11/16/2021   IR ANGIOGRAM VISCERAL SELECTIVE  11/16/2021   IR EMBO ART  VEN HEMORR LYMPH EXTRAV  INC GUIDE ROADMAPPING  11/05/2021   IR EMBO ART  VEN HEMORR LYMPH EXTRAV  INC GUIDE ROADMAPPING  11/16/2021   IR GENERIC HISTORICAL  09/14/2016   IR US GUIDE VASC ACCESS RIGHT 09/14/2016 [name], DO [facility]   IR GENERIC HISTORICAL  09/14/2016   IR FLUORO GUIDE CV LINE RIGHT 09/14/2016 [name], DO [facility]   IR PARACENTESIS  06/22/2021   IR US GUIDE [place] RIGHT  11/05/2021   IR US GUIDE [place] RIGHT  11/16/2021   LAPAROTOMY N/A 05/23/2021   Procedure: EXPLORATORY LAPAROTOMY LYSIS ADHESIONS;  Surgeon: [name], MD;  Location: [place];  Service: General;  Laterality: N/A;   LAPAROTOMY N/A 11/20/2021   Procedure: EXPLORATORY LAPAROTOMY, REPAIR OF BLEEDING DUODENAL ULCER;  Surgeon: Dwan Bolt, MD;  Location: [place];  Service: General;  Laterality: N/A;   ORIF TIBIA PLATEAU Left 01/15/2018   Procedure: OPEN REDUCTION INTERNAL FIXATION (ORIF) TIBIAL PLATEAU;  Surgeon: Altamese [place], MD;  Location: [place];  Service:  Orthopedics;  Laterality: Left;   REVISON OF ARTERIOVENOUS FISTULA Left 05/26/2022   Procedure: REVISION OF LEFT ARM ARTERIOVENOUS FISTULA WITH PLICATION OF PSEUDOANEURYSM;  Surgeon: Waynetta Sandy, MD;  Location: [place];  Service: Vascular;  Laterality: Left;   SCLEROTHERAPY  11/05/2021   Procedure: Clide Deutscher;  Surgeon: Carol Ada, MD;  Location: [place];  Service: Endoscopy;;   SCLEROTHERAPY  11/16/2021   Procedure: Clide Deutscher;  Surgeon: [name], MD;  Location: [facility]OSCOPY;   Service: Gastroenterology;;   TRANSTHORACIC ECHOCARDIOGRAM  07/2016    EF 60-65%, No RWMA. Mod Concentric LVH - Gr 2 DD. Severe LA dilation. PAP ~35 mmHg (mild Pulm HTN)  --> no changes noted 1 month later   Family History  Family History  Problem Relation Age of Onset   Heart failure Mother        Died at age 60.   Heart attack Mother 63   Hypertension Mother    Diabetes Mellitus II Mother    Other Father        Unknown   Kidney failure Sister        (Oldest Sister)   Other Other        Multiple siblings have started her heart disease, he is not sure of the details.   CAD Nephew    Social History  reports that he quit smoking about 4 months ago. His smoking use included cigarettes. He smoked an average of .25 packs per day. He has never been exposed to tobacco smoke. He has never used smokeless tobacco. He reports current drug use. Frequency: 1.00 time per week. Drugs: Marijuana and Heroin. He reports that he does not drink alcohol. Allergies No Known Allergies Home medications Prior to Admission medications   Medication Sig Start Date End Date Taking? Authorizing Provider  albuterol (VENTOLIN HFA) 108 (90 Base) MCG/ACT inhaler Inhale 2 puffs into the lungs every 6 (six) hours as needed for wheezing or shortness of breath. 01/06/22   [provider]  carvedilol (COREG) 6.25 MG tablet Take 1 tablet (6.25 mg total) by mouth 2 (two) times daily with a meal. 08/02/22 09/01/22  Arrien, Jimmy Picket, MD  diltiazem (CARDIZEM CD) 360 MG 24 hr capsule Take 1 capsule (360 mg total) by mouth daily. 08/03/22 09/02/22  Arrien, Jimmy Picket, MD  DULoxetine (CYMBALTA) 20 MG capsule Take 20 mg by mouth daily. 01/06/22   [provider]  hydrALAZINE (APRESOLINE) 50 MG tablet Take 1 tablet (50 mg total) by mouth 2 (two) times daily. 08/02/22 09/01/22  Arrien, Jimmy Picket, MD  lidocaine (LMX) 4 % cream Apply 1 Application topically 3 (three) times a week. 06/27/22   [provider]  LIDOCAINE-PRILOCAINE EX Apply 1 application  topically Every Tuesday,Thursday,and Saturday with dialysis.    [provider]  oxyCODONE (OXY IR/ROXICODONE) 5 MG immediate release tablet Take 5 mg by mouth 3 (three) times daily as needed. 08/09/22   [provider]  pantoprazole (PROTONIX) 40 MG tablet Take 1 tablet (40 mg total) by mouth 2 (two) times daily before a meal. 08/14/22   Lavina Hamman, MD     Vitals:   09/18/22 0001 09/18/22 0014 09/18/22 0030 09/18/22 0035  BP:   (!) 190/126 (!) 183/131  Pulse:  99 100 (!) 102  Resp:  (!) 28 (!) 33 (!) 32  Temp: 98.7 F (37.1 C)     TempSrc: Oral     SpO2:  (!) 88% 96% 97%   Exam Gen alert,  no distress, NRB mask, pt w/ mod ^wob No rash, cyanosis or gangrene Sclera anicteric, throat clear  No jvd or bruits Chest bilat basilar rales 1/3 up RRR no MRG Abd soft ntnd no mass or ascites +bs GU normal male MS no joint effusions or deformity Ext diffuse pitting 1-2+ LE edema, no wounds or ulcers Neuro is alert, Ox 3 , nf    LUA AVF+bruit   Home meds include - albuterol, coreg 6.25 bid, cardizem 360 cd, cymbalta, hydralazine 50 bid, oxycodone, protonix, prns     [facility] HD: [facility]  3h 52mn   450/1.5   70.5kg  2/2 bath  LUA AVF  Hep none - last HD 12/02, post wt 78kg - has been coming off 5-9 kg over the last 2 wks - doxercalciferol 4 ug IV tts - mircera 225 ug IV q2, last 11/30, due 12/13   CXR 12/04 - IMPRESSION: Cardiomegaly, vascular congestion. Right lower lobe opacity concerning for pneumonia.  Assessment/ Plan: AHRF/ vol overload/ pulm edema - on NRB in ED. Per ED MD will be starting IV ntg. Labs are not back yet. Plan HD tonight when a spot is available upstairs.  Have d/w ED MD.  HTN - poorly controlled, BP 178/ 121. IV ntg to be started per ED.  Volume - coming off 4- 8 kg over dry wt for the past 2 wks. Sig edema on exam today.   ESRD - on HD TTS. Last HD was Saturday. HD as above.   Atrial fib H/o colon cancer H/o GI bleed Anemia esrd - Hb 10.4. No esa needs.  MBD ckd - labs pending    RKelly Splinter MD 09/18/2022, 12:56 AM Recent Labs  Lab 09/18/22 0002 09/18/22 0011  HGB 10.4* 11.9*  K  --  4.3   Inpatient medications:  Chlorhexidine Gluconate Cloth  6 each Topical Q0600    albuterol     nitroGLYCERIN 15 mcg/min (09/18/22 0040)

## 2022-09-18 NOTE — ED Provider Notes (Addendum)
[facility] Emergency Department Provider Note MRN:  [id number]  Arrival date & time: 09/18/22     Chief Complaint   Respiratory Distress   History of Present Illness   Timothy [name] is a 61 y.o. year-old male with a history of ESRD, presenting to the ED with chief complaint of respiratory distress.  Patient only received a half session of dialysis a few days ago because of cramps.  He is now in respiratory distress, cannot breathe, endorsing chest pain/tightness as well.  Says he has been doing too much "ice".  Review of Systems  I was unable to obtain a full/accurate HPI, PMH, or ROS due to the patient's respiratory distress.  Patient's Health History    Past Medical History:  Diagnosis Date   Acute metabolic encephalopathy 98/33/8250   Anemia of chronic kidney failure    BPH (benign prostatic hyperplasia)    Colon cancer [facility]) 2014   spouse states he had surgury for colon CA   End stage renal disease on dialysis [facility]) 2017   TTHSat   Hepatitis C    treated   Hypertension    Hypertensive crisis 04/07/2022   Hypertensive heart disease with chronic diastolic congestive heart failure ([place]) 07/17/2016   Polysubstance abuse ([place])    History of heroin and marijuana use   Thoracic aortic aneurysm ([place])    followed by Dr. Ellyn Hack    Past Surgical History:  Procedure Laterality Date   A/V FISTULAGRAM Left 12/02/2021   Procedure: A/V Fistulagram;  Surgeon: Cherre Robins, MD;  Location: [place] CV [facility];  Service: Cardiovascular;  Laterality: Left;   A/V FISTULAGRAM Left 04/24/2022   Procedure: A/V Fistulagram;  Surgeon: Waynetta Sandy, MD;  Location: [place] CV [facility];  Service: Cardiovascular;  Laterality: Left;   ABDOMINAL SURGERY     AV FISTULA PLACEMENT Left 09/19/2016   Procedure: Left arm Radiocephalic ARTERIOVENOUS (AV) FISTULA CREATION;  Surgeon: Conrad Tecumseh, MD;  Location: [place];  Service: Vascular;  Laterality: Left;    BASCILIC VEIN TRANSPOSITION Left 07/09/2017   Procedure: BRACHIOCEPHALIC FISTULA CREATION;  Surgeon: Conrad Ten Sleep, MD;  Location: [place];  Service: Vascular;  Laterality: Left;   BIOPSY  07/05/2022   Procedure: BIOPSY;  Surgeon: Thornton Park, MD;  Location: [place];  Service: Gastroenterology;;   COLON SURGERY  2014   COLONOSCOPY N/A 07/12/2022   Procedure: COLONOSCOPY;  Surgeon: Doran Stabler, MD;  Location: [place];  Service: Gastroenterology;  Laterality: N/A;   COLONOSCOPY WITH PROPOFOL N/A 07/05/2022   Procedure: COLONOSCOPY WITH PROPOFOL;  Surgeon: Thornton Park, MD;  Location: [place];  Service: Gastroenterology;  Laterality: N/A;   ENTEROSCOPY N/A 07/28/2022   Procedure: ENTEROSCOPY;  Surgeon: [name], MD;  Location: [facility] ENDOSCOPY;  Service: Gastroenterology;  Laterality: N/A;   ESOPHAGOGASTRODUODENOSCOPY (EGD) WITH PROPOFOL N/A 11/05/2021   Procedure: ESOPHAGOGASTRODUODENOSCOPY (EGD) WITH PROPOFOL;  Surgeon: Carol Ada, MD;  Location: [place];  Service: Endoscopy;  Laterality: N/A;   ESOPHAGOGASTRODUODENOSCOPY (EGD) WITH PROPOFOL N/A 11/14/2021   Procedure: ESOPHAGOGASTRODUODENOSCOPY (EGD) WITH PROPOFOL;  Surgeon: [name], MD;  Location: [place];  Service: Gastroenterology;  Laterality: N/A;   ESOPHAGOGASTRODUODENOSCOPY (EGD) WITH PROPOFOL N/A 11/16/2021   Procedure: ESOPHAGOGASTRODUODENOSCOPY (EGD) WITH PROPOFOL;  Surgeon: [name], MD;  Location: [place];  Service: Gastroenterology;  Laterality: N/A;   ESOPHAGOGASTRODUODENOSCOPY (EGD) WITH PROPOFOL N/A 07/05/2022   Procedure: ESOPHAGOGASTRODUODENOSCOPY (EGD) WITH PROPOFOL;  Surgeon: Thornton Park, MD;  Location: [place];  Service:  Gastroenterology;  Laterality: N/A;   ESOPHAGOGASTRODUODENOSCOPY (EGD) WITH PROPOFOL N/A 07/12/2022   Procedure: ESOPHAGOGASTRODUODENOSCOPY (EGD) WITH PROPOFOL;  Surgeon: Doran Stabler, MD;  Location: [place];  Service:  Gastroenterology;  Laterality: N/A;   ESOPHAGOGASTRODUODENOSCOPY (EGD) WITH PROPOFOL N/A 07/31/2022   Procedure: ESOPHAGOGASTRODUODENOSCOPY (EGD) WITH PROPOFOL;  Surgeon: Ladene Artist, MD;  Location: [place];  Service: Gastroenterology;  Laterality: N/A;   ESOPHAGOGASTRODUODENOSCOPY (EGD) WITH PROPOFOL N/A 08/13/2022   Procedure: ESOPHAGOGASTRODUODENOSCOPY (EGD) WITH PROPOFOL;  Surgeon: Gatha Mayer, MD;  Location: [place];  Service: Gastroenterology;  Laterality: N/A;   HEMOSTASIS CLIP PLACEMENT  11/16/2021   Procedure: HEMOSTASIS CLIP PLACEMENT;  Surgeon: [name], MD;  Location: [facility] ENDOSCOPY;  Service: Gastroenterology;;   HEMOSTASIS CLIP PLACEMENT  07/05/2022   Procedure: HEMOSTASIS CLIP PLACEMENT;  Surgeon: Thornton Park, MD;  Location: [facility] ENDOSCOPY;  Service: Gastroenterology;;   HEMOSTASIS CLIP PLACEMENT  08/13/2022   Procedure: HEMOSTASIS CLIP PLACEMENT;  Surgeon: Gatha Mayer, MD;  Location: [facility] ENDOSCOPY;  Service: Gastroenterology;;   HEMOSTASIS CONTROL  11/05/2021   Procedure: HEMOSTASIS CONTROL;  Surgeon: Carol Ada, MD;  Location: [place];  Service: Endoscopy;;   HEMOSTASIS CONTROL  11/16/2021   Procedure: HEMOSTASIS CONTROL;  Surgeon: [name], MD;  Location: [place];  Service: Gastroenterology;;   HEMOSTASIS CONTROL  07/05/2022   Procedure: HEMOSTASIS CONTROL;  Surgeon: Thornton Park, MD;  Location: [place];  Service: Gastroenterology;;   HOT HEMOSTASIS N/A 11/16/2021   Procedure: HOT HEMOSTASIS (ARGON PLASMA COAGULATION/BICAP);  Surgeon: [name], MD;  Location: [place];  Service: Gastroenterology;  Laterality: N/A;   HOT HEMOSTASIS N/A 08/13/2022   Procedure: HOT HEMOSTASIS (ARGON PLASMA COAGULATION/BICAP);  Surgeon: Gatha Mayer, MD;  Location: [place];  Service: Gastroenterology;  Laterality: N/A;   INSERTION OF DIALYSIS CATHETER N/A 09/19/2016   Procedure: INSERTION OF TUNNELED DIALYSIS CATHETER;  Surgeon:  Conrad Weeki Wachee, MD;  Location: [place];  Service: Vascular;  Laterality: N/A;   IR ANGIOGRAM SELECTIVE EACH ADDITIONAL VESSEL  11/16/2021   IR ANGIOGRAM VISCERAL SELECTIVE  11/05/2021   IR ANGIOGRAM VISCERAL SELECTIVE  11/16/2021   IR ANGIOGRAM VISCERAL SELECTIVE  11/16/2021   IR EMBO ART  VEN HEMORR LYMPH EXTRAV  INC GUIDE ROADMAPPING  11/05/2021   IR EMBO ART  VEN HEMORR LYMPH EXTRAV  INC GUIDE ROADMAPPING  11/16/2021   IR GENERIC HISTORICAL  09/14/2016   IR US GUIDE VASC ACCESS RIGHT 09/14/2016 [name], DO [facility]   IR GENERIC HISTORICAL  09/14/2016   IR FLUORO GUIDE CV LINE RIGHT 09/14/2016 [name], DO [facility]   IR PARACENTESIS  06/22/2021   IR US GUIDE [place] RIGHT  11/05/2021   IR US GUIDE [place] RIGHT  11/16/2021   LAPAROTOMY N/A 05/23/2021   Procedure: EXPLORATORY LAPAROTOMY LYSIS ADHESIONS;  Surgeon: [name], MD;  Location: [place];  Service: General;  Laterality: N/A;   LAPAROTOMY N/A 11/20/2021   Procedure: EXPLORATORY LAPAROTOMY, REPAIR OF BLEEDING DUODENAL ULCER;  Surgeon: Dwan Bolt, MD;  Location: [place];  Service: General;  Laterality: N/A;   ORIF TIBIA PLATEAU Left 01/15/2018   Procedure: OPEN REDUCTION INTERNAL FIXATION (ORIF) TIBIAL PLATEAU;  Surgeon: Altamese Newkirk, MD;  Location: [place];  Service: Orthopedics;  Laterality: Left;   REVISON OF ARTERIOVENOUS FISTULA Left 05/26/2022   Procedure: REVISION OF LEFT ARM ARTERIOVENOUS FISTULA WITH PLICATION OF PSEUDOANEURYSM;  Surgeon: Waynetta Sandy, MD;  Location: [place];  Service: Vascular;  Laterality: Left;  SCLEROTHERAPY  11/05/2021   Procedure: Clide Deutscher;  Surgeon: Carol Ada, MD;  Location: [facility] ENDOSCOPY;  Service: Endoscopy;;   Clide Deutscher  11/16/2021   Procedure: Clide Deutscher;  Surgeon: [name], MD;  Location: [facility] ENDOSCOPY;  Service: Gastroenterology;;   TRANSTHORACIC ECHOCARDIOGRAM  07/2016    EF 60-65%, No RWMA. Mod Concentric LVH - Gr 2 DD. Severe LA dilation. PAP ~35 mmHg  (mild Pulm HTN)  --> no changes noted 1 month later    Family History  Problem Relation Age of Onset   Heart failure Mother        Died at age 42.   Heart attack Mother 2   Hypertension Mother    Diabetes Mellitus II Mother    Other Father        Unknown   Kidney failure Sister        (Oldest Sister)   Other Other        Multiple siblings have started her heart disease, he is not sure of the details.   CAD Nephew     Social History   Socioeconomic History   Marital status: Married    Spouse name: Not on file   Number of children: Not on file   Years of education: Not on file   Highest education level: Not on file  Occupational History   Occupation: disabled  Tobacco Use   Smoking status: Former    Packs/day: 0.25    Types: Cigarettes    Quit date: 05/12/2022    Years since quitting: 0.3    Passive exposure: Never   Smokeless tobacco: Never  Vaping Use   Vaping Use: Never used  Substance and Sexual Activity   Alcohol use: No    Alcohol/week: 7.0 standard drinks of alcohol    Types: 7 Cans of beer per week   Drug use: Yes    Frequency: 1.0 times per week    Types: Marijuana, Heroin    Comment: last time 05/25/22   Sexual activity: Yes    Comment: once a week  Other Topics Concern   Not on file  Social History Narrative   Not on file   Social Determinants of Health   Financial Resource Strain: Not on file  Food Insecurity: No Food Insecurity (07/27/2022)   Hunger Vital Sign    Worried About Running Out of Food in the Last Year: Never true    Ran Out of Food in the Last Year: Never true  Transportation Needs: No Transportation Needs (07/27/2022)   PRAPARE - Hydrologist (Medical): No    Lack of Transportation (Non-Medical): No  Physical Activity: Not on file  Stress: Not on file  Social Connections: Not on file  Intimate Partner Violence: Not At Risk (07/27/2022)   Humiliation, Afraid, Rape, and Kick questionnaire    Fear of  Current or Ex-Partner: No    Emotionally Abused: No    Physically Abused: No    Sexually Abused: No     Physical Exam   Vitals:   09/18/22 0157 09/18/22 0213  BP: (!) 196/123   Pulse: (!) 101   Resp: (!) 27   Temp:    SpO2: (!) 87% 96%    CONSTITUTIONAL: Ill-appearing, in moderate respiratory distress NEURO/PSYCH:  Alert and oriented x 3, no focal deficits EYES:  eyes equal and reactive ENT/NECK:  no LAD, no JVD CARDIO: Regular rate, well-perfused, normal S1 and S2 PULM: Diffuse rhonchi and wheezing GI/GU: Prominently distended abdomen, nontender  MSK/SPINE:  No gross deformities, no edema SKIN:  no rash, atraumatic   *Additional and/or pertinent findings included in MDM below  Diagnostic and Interventional Summary    EKG Interpretation  Date/Time:  Monday September 18 2022 00:09:25 EST Ventricular Rate:  100 PR Interval:  186 QRS Duration: 102 QT Interval:  345 QTC Calculation: 445 R Axis:   -3 Text Interpretation: Sinus tachycardia Repol abnrm suggests ischemia, lateral leads Confirmed by Gerlene Fee [phone]) on 09/18/2022 1:21:26 AM       Labs Reviewed  CBC - Abnormal; Notable for the following components:      Result Value   WBC 12.1 (*)    RBC 3.59 (*)    Hemoglobin 10.4 (*)    HCT 32.0 (*)    RDW 17.2 (*)    All other components within normal limits  COMPREHENSIVE METABOLIC PANEL - Abnormal; Notable for the following components:   Sodium 131 (*)    Chloride 91 (*)    Glucose, Bld 125 (*)    BUN 78 (*)    Creatinine, Ser 7.81 (*)    Calcium 8.2 (*)    Albumin 2.9 (*)    Alkaline Phosphatase 150 (*)    GFR, Estimated 7 (*)    Anion gap 17 (*)    All other components within normal limits  BRAIN NATRIURETIC PEPTIDE - Abnormal; Notable for the following components:   B Natriuretic Peptide >4,500.0 (*)    All other components within normal limits  PROTIME-INR - Abnormal; Notable for the following components:   Prothrombin Time 15.9 (*)    INR 1.3  (*)    All other components within normal limits  I-STAT VENOUS BLOOD GAS, ED - Abnormal; Notable for the following components:   pO2, Ven 93 (*)    Sodium 131 (*)    Calcium, Ion 0.99 (*)    HCT 35.0 (*)    Hemoglobin 11.9 (*)    All other components within normal limits  TROPONIN I (HIGH SENSITIVITY) - Abnormal; Notable for the following components:   Troponin I (High Sensitivity) 126 (*)    All other components within normal limits  LACTIC ACID, PLASMA  HEPATITIS B SURFACE ANTIGEN  HEPATITIS B SURFACE ANTIBODY, QUANTITATIVE  TROPONIN I (HIGH SENSITIVITY)    DG Chest Port 1 View  Final Result      Medications  albuterol (PROVENTIL) (2.5 MG/3ML) 0.083% nebulizer solution 15 mg (15 mg Nebulization New Bag/Given 09/17/22 2357)  nitroGLYCERIN 50 mg in dextrose 5 % 250 mL (0.2 mg/mL) infusion (25 mcg/min Intravenous Rate/Dose Change 09/18/22 0159)  Chlorhexidine Gluconate Cloth 2 % PADS 6 each (has no administration in time range)  azithromycin (ZITHROMAX) 500 mg in sodium chloride 0.9 % 250 mL IVPB (has no administration in time range)  enoxaparin (LOVENOX) injection 30 mg (has no administration in time range)  rocuronium bromide 100 MG/10ML SOSY (has no administration in time range)  etomidate (AMIDATE) 2 MG/ML injection (has no administration in time range)  propofol (DIPRIVAN) 1000 MG/100ML infusion (has no administration in time range)  cefTRIAXone (ROCEPHIN) 2 g in sodium chloride 0.9 % 100 mL IVPB (0 g Intravenous Stopped 09/18/22 0229)     Procedures  /  Critical Care .Critical Care  Performed by: Maudie Flakes, MD Authorized by: Maudie Flakes, MD   Critical care provider statement:    Critical care time (minutes):  80   Critical care was necessary to treat or prevent imminent or life-threatening deterioration of the  following conditions:  Respiratory failure   Critical care was time spent personally by me on the following activities:  Development of treatment plan with  patient or surrogate, discussions with consultants, evaluation of patient's response to treatment, examination of patient, ordering and review of laboratory studies, ordering and review of radiographic studies, ordering and performing treatments and interventions, pulse oximetry, re-evaluation of patient's condition and review of old charts   ED Course and Medical Decision Making  Initial Impression and Ddx Respiratory failure, on nonrebreather, increased work of breathing, exam is highly suspicious for a fluid overloaded state in the setting of ESRD.  Prominent JVD, wet lung sounds, distended abdomen.  Suspect will need urgent/emergent dialysis.  Awaiting EKG, electrolytes.  I spoke with Dr. Soyla Murphy of nephrology to make him aware.  Starting nitro drip, patient provided with breathing treatment by respiratory therapy.  Other considerations include ACS, aortic dissection given the methamphetamine use recently but these are felt to be less likely, will follow-up chest x-ray.  Past medical/surgical history that increases complexity of ED encounter: ESRD  Interpretation of Diagnostics I personally reviewed the EKG and my interpretation is as follows: Sinus rhythm, some ST depressions noted suggestive of possible ischemia.  Labs reveal a reassuring potassium level, elevated BUN and creatinine as anticipated, troponin elevated at 126.  Chest x-ray suggestive of pneumonia per radiology.  Overall favoring volume overloaded state and edema rather than pneumonia.  Patient Reassessment and Ultimate Disposition/Management     Given the need for dialysis, elevated troponin, question pneumonia, will request hospitalist admission.  3 AM update: Patient has become more somnolent, he is not hypercarbic, suspect it is drug or alcohol related given that he admitted to illicit drug use earlier.  He is too somnolent for BiPAP, he is too somnolent for the hemodialysis suite.  And so, best course of action is intubation  for airway protection and admission to the ICU where he can receive hemodialysis.  See separate note by respiratory therapy for intubation details, I was present and assisting for the entirety of the procedure.  Patient management required discussion with the following services or consulting groups:  Hospitalist Service  Complexity of Problems Addressed Acute illness or injury that poses threat of life of bodily function  Additional Data Reviewed and Analyzed Further history obtained from: Past medical history and medications listed in the EMR and Recent discharge summary  Additional Factors Impacting ED Encounter Risk Consideration of hospitalization  Barth Kirks. Sedonia Small, MD [place] [email or web address]  Final Clinical Impressions(s) / ED Diagnoses     ICD-10-CM   1. Shortness of breath  R06.02     2. Hypervolemia, unspecified hypervolemia type  E87.70     3. Chest pain, unspecified type  R07.9     4. Altered mental status, unspecified altered mental status type  R41.82     5. Acute respiratory failure with hypoxia (HCC)  J96.01       ED Discharge Orders     None        Discharge Instructions Discussed with and Provided to Patient:   Discharge Instructions   None      Maudie Flakes, MD 09/18/22 0125    Maudie Flakes, MD 09/18/22 [phone]

## 2022-09-18 NOTE — H&P (Addendum)
History and Physical  Timothy [name] NFA:[id number] DOB: Mar 01, 1961 DOA: 09/17/2022  Referring physician: Dr. Sedonia Small, EDP  PCP: [name], MD  Outpatient Specialists: Nephrology, cardiology. Patient coming from: Home.  Chief Complaint: Shortness of breath.  HPI: Timothy [name] is a 61 y.o. male with medical history significant for ESRD on HD TTS, polysubstance abuse, hypertension, chronic anxiety/depression, GERD, recently admitted on 08/12/2022 and discharged on 08/14/2022 for GI bleed, who presented to [facility] ED with complaints of shortness of breath.  EMS was activated.  Upon EMS arrival he was noted to be in respiratory distress.  He did not tolerate CPAP by EMS.  En route he received 0.4 nitro tablet and was placed on nonrebreather.  In the ED, the patient continues to have increased work of breathing and audible rales.  Bilateral JVD and peripheral edema noted on exam.  Due to concern for volume overload EDP consulted nephrology.  Plan for hemodialysis today.  Last hemodialysis was on Saturday, did not complete session due to cramping.  At the time of this visit, the patient is lethargic but easily arousable to voices and working very hard to breathe.  Did not tolerate BiPAP.  Due to persistent respiratory distress, decision was made to intubate and transfer to the ICU, higher level of care.  ED Course: Tmax 98.2.  BP 179/143, pulse 90, respiration rate 18, saturation 99% on BiPAP.  Lab studies remarkable for serum sodium 131, BUN 78, creatinine 7.81.  Troponin 126.  BNP greater than 4500.  WBC 12.1, hemoglobin 10.4.  Review of Systems: Review of systems as noted in the HPI. All other systems reviewed and are negative.   Past Medical History:  Diagnosis Date   Acute metabolic encephalopathy 46/96/2952   Anemia of chronic kidney failure    BPH (benign prostatic hyperplasia)    Colon cancer [facility]) 2014   spouse states he had surgury for colon CA   End stage renal disease on  dialysis [facility]) 2017   TTHSat   Hepatitis C    treated   Hypertension    Hypertensive crisis 04/07/2022   Hypertensive heart disease with chronic diastolic congestive heart failure ([place]) 07/17/2016   Polysubstance abuse ([place])    History of heroin and marijuana use   Thoracic aortic aneurysm ([place])    followed by Dr. Ellyn Hack   Past Surgical History:  Procedure Laterality Date   A/V FISTULAGRAM Left 12/02/2021   Procedure: A/V Fistulagram;  Surgeon: Cherre Robins, MD;  Location: [place] CV LAB;  Service: Cardiovascular;  Laterality: Left;   A/V FISTULAGRAM Left 04/24/2022   Procedure: A/V Fistulagram;  Surgeon: Waynetta Sandy, MD;  Location: [place] CV LAB;  Service: Cardiovascular;  Laterality: Left;   ABDOMINAL SURGERY     AV FISTULA PLACEMENT Left 09/19/2016   Procedure: Left arm Radiocephalic ARTERIOVENOUS (AV) FISTULA CREATION;  Surgeon: Conrad Appomattox, MD;  Location: [place];  Service: Vascular;  Laterality: Left;   BASCILIC VEIN TRANSPOSITION Left 07/09/2017   Procedure: BRACHIOCEPHALIC FISTULA CREATION;  Surgeon: Conrad [place], MD;  Location: [place];  Service: Vascular;  Laterality: Left;   BIOPSY  07/05/2022   Procedure: BIOPSY;  Surgeon: Thornton Park, MD;  Location: [place];  Service: Gastroenterology;;   COLON SURGERY  2014   COLONOSCOPY N/A 07/12/2022   Procedure: COLONOSCOPY;  Surgeon: Doran Stabler, MD;  Location: [place];  Service: Gastroenterology;  Laterality: N/A;   COLONOSCOPY WITH PROPOFOL N/A 07/05/2022   Procedure: COLONOSCOPY WITH PROPOFOL;  Surgeon:  Thornton Park, MD;  Location: [place];  Service: Gastroenterology;  Laterality: N/A;   ENTEROSCOPY N/A 07/28/2022   Procedure: ENTEROSCOPY;  Surgeon: [name], MD;  Location: [facility] ENDOSCOPY;  Service: Gastroenterology;  Laterality: N/A;   ESOPHAGOGASTRODUODENOSCOPY (EGD) WITH PROPOFOL N/A 11/05/2021   Procedure: ESOPHAGOGASTRODUODENOSCOPY (EGD) WITH PROPOFOL;  Surgeon: Carol Ada, MD;  Location: [place];  Service: Endoscopy;  Laterality: N/A;   ESOPHAGOGASTRODUODENOSCOPY (EGD) WITH PROPOFOL N/A 11/14/2021   Procedure: ESOPHAGOGASTRODUODENOSCOPY (EGD) WITH PROPOFOL;  Surgeon: [name], MD;  Location: [place];  Service: Gastroenterology;  Laterality: N/A;   ESOPHAGOGASTRODUODENOSCOPY (EGD) WITH PROPOFOL N/A 11/16/2021   Procedure: ESOPHAGOGASTRODUODENOSCOPY (EGD) WITH PROPOFOL;  Surgeon: [name], MD;  Location: [place];  Service: Gastroenterology;  Laterality: N/A;   ESOPHAGOGASTRODUODENOSCOPY (EGD) WITH PROPOFOL N/A 07/05/2022   Procedure: ESOPHAGOGASTRODUODENOSCOPY (EGD) WITH PROPOFOL;  Surgeon: Thornton Park, MD;  Location: [place];  Service: Gastroenterology;  Laterality: N/A;   ESOPHAGOGASTRODUODENOSCOPY (EGD) WITH PROPOFOL N/A 07/12/2022   Procedure: ESOPHAGOGASTRODUODENOSCOPY (EGD) WITH PROPOFOL;  Surgeon: Doran Stabler, MD;  Location: [place];  Service: Gastroenterology;  Laterality: N/A;   ESOPHAGOGASTRODUODENOSCOPY (EGD) WITH PROPOFOL N/A 07/31/2022   Procedure: ESOPHAGOGASTRODUODENOSCOPY (EGD) WITH PROPOFOL;  Surgeon: Ladene Artist, MD;  Location: [place];  Service: Gastroenterology;  Laterality: N/A;   ESOPHAGOGASTRODUODENOSCOPY (EGD) WITH PROPOFOL N/A 08/13/2022   Procedure: ESOPHAGOGASTRODUODENOSCOPY (EGD) WITH PROPOFOL;  Surgeon: Gatha Mayer, MD;  Location: [place];  Service: Gastroenterology;  Laterality: N/A;   HEMOSTASIS CLIP PLACEMENT  11/16/2021   Procedure: HEMOSTASIS CLIP PLACEMENT;  Surgeon: [name], MD;  Location: [facility] ENDOSCOPY;  Service: Gastroenterology;;   HEMOSTASIS CLIP PLACEMENT  07/05/2022   Procedure: HEMOSTASIS CLIP PLACEMENT;  Surgeon: Thornton Park, MD;  Location: [facility] ENDOSCOPY;  Service: Gastroenterology;;   HEMOSTASIS CLIP PLACEMENT  08/13/2022   Procedure: HEMOSTASIS CLIP PLACEMENT;  Surgeon: Gatha Mayer, MD;  Location: [facility] ENDOSCOPY;  Service: Gastroenterology;;    HEMOSTASIS CONTROL  11/05/2021   Procedure: HEMOSTASIS CONTROL;  Surgeon: Carol Ada, MD;  Location: [place];  Service: Endoscopy;;   HEMOSTASIS CONTROL  11/16/2021   Procedure: HEMOSTASIS CONTROL;  Surgeon: [name], MD;  Location: [place];  Service: Gastroenterology;;   HEMOSTASIS CONTROL  07/05/2022   Procedure: HEMOSTASIS CONTROL;  Surgeon: Thornton Park, MD;  Location: [place];  Service: Gastroenterology;;   HOT HEMOSTASIS N/A 11/16/2021   Procedure: HOT HEMOSTASIS (ARGON PLASMA COAGULATION/BICAP);  Surgeon: [name], MD;  Location: [place];  Service: Gastroenterology;  Laterality: N/A;   HOT HEMOSTASIS N/A 08/13/2022   Procedure: HOT HEMOSTASIS (ARGON PLASMA COAGULATION/BICAP);  Surgeon: Gatha Mayer, MD;  Location: [place];  Service: Gastroenterology;  Laterality: N/A;   INSERTION OF DIALYSIS CATHETER N/A 09/19/2016   Procedure: INSERTION OF TUNNELED DIALYSIS CATHETER;  Surgeon: Conrad Linton Lyla Jasek, MD;  Location: [place];  Service: Vascular;  Laterality: N/A;   IR ANGIOGRAM SELECTIVE EACH ADDITIONAL VESSEL  11/16/2021   IR ANGIOGRAM VISCERAL SELECTIVE  11/05/2021   IR ANGIOGRAM VISCERAL SELECTIVE  11/16/2021   IR ANGIOGRAM VISCERAL SELECTIVE  11/16/2021   IR EMBO ART  VEN HEMORR LYMPH EXTRAV  INC GUIDE ROADMAPPING  11/05/2021   IR EMBO ART  VEN HEMORR LYMPH EXTRAV  INC GUIDE ROADMAPPING  11/16/2021   IR GENERIC HISTORICAL  09/14/2016   IR US GUIDE VASC ACCESS RIGHT 09/14/2016 [name], DO [facility]   IR GENERIC HISTORICAL  09/14/2016   IR FLUORO GUIDE CV LINE RIGHT 09/14/2016 [name],  DO [facility]   IR PARACENTESIS  06/22/2021   IR US GUIDE VASC ACCESS RIGHT  11/05/2021   IR US GUIDE VASC ACCESS RIGHT  11/16/2021   LAPAROTOMY N/A 05/23/2021   Procedure: EXPLORATORY LAPAROTOMY LYSIS ADHESIONS;  Surgeon: [name], MD;  Location: [place];  Service: General;  Laterality: N/A;   LAPAROTOMY N/A 11/20/2021   Procedure: EXPLORATORY LAPAROTOMY,  REPAIR OF BLEEDING DUODENAL ULCER;  Surgeon: Dwan Bolt, MD;  Location: [place];  Service: General;  Laterality: N/A;   ORIF TIBIA PLATEAU Left 01/15/2018   Procedure: OPEN REDUCTION INTERNAL FIXATION (ORIF) TIBIAL PLATEAU;  Surgeon: Altamese Hansford, MD;  Location: [place];  Service: Orthopedics;  Laterality: Left;   REVISON OF ARTERIOVENOUS FISTULA Left 05/26/2022   Procedure: REVISION OF LEFT ARM ARTERIOVENOUS FISTULA WITH PLICATION OF PSEUDOANEURYSM;  Surgeon: Waynetta Sandy, MD;  Location: [place];  Service: Vascular;  Laterality: Left;   SCLEROTHERAPY  11/05/2021   Procedure: Clide Deutscher;  Surgeon: Carol Ada, MD;  Location: [place];  Service: Endoscopy;;   SCLEROTHERAPY  11/16/2021   Procedure: Clide Deutscher;  Surgeon: [name], MD;  Location: [facility] ENDOSCOPY;  Service: Gastroenterology;;   TRANSTHORACIC ECHOCARDIOGRAM  07/2016    EF 60-65%, No RWMA. Mod Concentric LVH - Gr 2 DD. Severe LA dilation. PAP ~35 mmHg (mild Pulm HTN)  --> no changes noted 1 month later    Social History:  reports that he quit smoking about 4 months ago. His smoking use included cigarettes. He smoked an average of .25 packs per day. He has never been exposed to tobacco smoke. He has never used smokeless tobacco. He reports current drug use. Frequency: 1.00 time per week. Drugs: Marijuana and Heroin. He reports that he does not drink alcohol.   No Known Allergies  Family History  Problem Relation Age of Onset   Heart failure Mother        Died at age 50.   Heart attack Mother 18   Hypertension Mother    Diabetes Mellitus II Mother    Other Father        Unknown   Kidney failure Sister        (Oldest Sister)   Other Other        Multiple siblings have started her heart disease, he is not sure of the details.   CAD Nephew       Prior to Admission medications   Medication Sig Start Date End Date Taking? Authorizing Provider  albuterol (VENTOLIN HFA) 108 (90 Base) MCG/ACT inhaler Inhale  2 puffs into the lungs every 6 (six) hours as needed for wheezing or shortness of breath. 01/06/22   [provider]  carvedilol (COREG) 6.25 MG tablet Take 1 tablet (6.25 mg total) by mouth 2 (two) times daily with a meal. 08/02/22 09/01/22  Arrien, Jimmy Picket, MD  diltiazem (CARDIZEM CD) 360 MG 24 hr capsule Take 1 capsule (360 mg total) by mouth daily. 08/03/22 09/02/22  Arrien, Jimmy Picket, MD  DULoxetine (CYMBALTA) 20 MG capsule Take 20 mg by mouth daily. 01/06/22   [provider]  hydrALAZINE (APRESOLINE) 50 MG tablet Take 1 tablet (50 mg total) by mouth 2 (two) times daily. 08/02/22 09/01/22  Arrien, Jimmy Picket, MD  lidocaine (LMX) 4 % cream Apply 1 Application topically 3 (three) times a week. 06/27/22   [provider]  LIDOCAINE-PRILOCAINE EX Apply 1 application  topically Every Tuesday,Thursday,and Saturday with dialysis.    [provider]  oxyCODONE (OXY IR/ROXICODONE)  5 MG immediate release tablet Take 5 mg by mouth 3 (three) times daily as needed. 08/09/22   [provider]  pantoprazole (PROTONIX) 40 MG tablet Take 1 tablet (40 mg total) by mouth 2 (two) times daily before a meal. 08/14/22   Lavina Hamman, MD    Physical Exam: BP (!) 183/131   Pulse (!) 102   Temp 98.7 F (37.1 C) (Oral)   Resp (!) 32   SpO2 97%   General: 61 y.o. year-old male well developed well nourished in no acute distress.  Lethargic, easily arousable.  On BiPAP. Cardiovascular: Tachycardic.  Bilateral JVD noted.  No thyromegaly.  1+ pitting edema lower extremities bilaterally.  Respiratory: Audible rales.  Poor inspiratory effort. Abdomen: Mildly distended with normal bowel sounds x4 quadrants. Muskuloskeletal: No cyanosis, clubbing or edema noted bilaterally Neuro: CN II-XII intact, strength, sensation, reflexes Skin: No ulcerative lesions noted or rashes Psychiatry: Unable to assess judgment and mood due to lethargy.         Labs on  Admission:  Basic Metabolic Panel: Recent Labs  Lab 09/18/22 0002 09/18/22 0011  NA 131* 131*  K 4.5 4.3  CL 91*  --   CO2 23  --   GLUCOSE 125*  --   BUN 78*  --   CREATININE 7.81*  --   CALCIUM 8.2*  --    Liver Function Tests: Recent Labs  Lab 09/18/22 0002  AST 24  ALT 18  ALKPHOS 150*  BILITOT 0.8  PROT 7.6  ALBUMIN 2.9*   No results for input(s): "LIPASE", "AMYLASE" in the last 168 hours. No results for input(s): "AMMONIA" in the last 168 hours. CBC: Recent Labs  Lab 09/18/22 0002 09/18/22 0011  WBC 12.1*  --   HGB 10.4* 11.9*  HCT 32.0* 35.0*  MCV 89.1  --   PLT 294  --    Cardiac Enzymes: No results for input(s): "CKTOTAL", "CKMB", "CKMBINDEX", "TROPONINI" in the last 168 hours.  BNP (last 3 results) Recent Labs    02/13/22 1646 04/06/22 1720 09/18/22 0002  BNP >4,500.0* >4,500.0* >4,500.0*    ProBNP (last 3 results) No results for input(s): "PROBNP" in the last 8760 hours.  CBG: No results for input(s): "GLUCAP" in the last 168 hours.  Radiological Exams on Admission: DG Chest Port 1 View  Result Date: 09/18/2022 CLINICAL DATA:  Shortness of breath, respiratory distress EXAM: PORTABLE CHEST 1 VIEW COMPARISON:  08/14/2022 FINDINGS: Cardiomegaly, vascular congestion. Right lower lobe airspace opacity/consolidation. Left mid lung atelectasis. No effusions or acute bony abnormality. IMPRESSION: No megaly, vascular congestion. Right lower lobe opacity concerning for pneumonia. Electronically Signed   By: Rolm Baptise M.D.   On: 09/18/2022 00:21    EKG: I independently viewed the EKG done and my findings are as followed: Sinus tachycardia rate of 100.  Nonspecific ST-T changes.  QTc 445.  Assessment/Plan Present on Admission:  Acute hypoxic respiratory failure (HCC)  Principal Problem:   Acute hypoxic respiratory failure (HCC)  Severe acute hypoxic respiratory failure, secondary to pulmonary edema, volume overload, right middle lobe  pneumonia. Last hemodialysis session was on Saturday, 09/16/2022, did not complete session due to cramping. Plan for hemodialysis today 09/18/22.  Appreciate nephrology's assistance. Ongoing respiratory distress.  Could not tolerate BiPAP.  Plan to intubate and transfer to the ICU.  ESRD on HD TTS Nephrology consulted, appreciate assistance. Volume status and electrolytes managed with hemodialysis.  Elevated troponin, suspect demand ischemia from severe hypoxia Initial troponin 126 No evidence  of acute ischemia on twelve-lead EKG. Trend troponin, repeat 12 lead EKG Closely monitor with telemetry Follow 2D echo  Acute on chronic diastolic CHF B/L JVD, pulmonary edema, peripheral edema, BNP>4500 Last 2D echo revealed LVEF 50-55% Follow repeat 2D echo Volume status managed with hemodialysis  Uncontrolled hypertension IV hydralazine as needed with parameters Closely monitor vital signs  Leukocytosis/possible HCAP, POA Personally reviewed chest x-ray showing right middle lobe infiltrates suggestive of pneumonia Obtain baseline procalcitonin Cefepime empirically until active infective process is ruled out Follow MRSA screen, if positive add IV vancomycin.  Polysubstance abuse Obtain UDS    Critical care time: 65 minutes.    DVT prophylaxis: Subcu heparin 3 times daily  Code Status: Full code  Family Communication: None at bedside  Disposition Plan: Initially admitted to stepdown unit.  Likely will be transferred to ICU after intubation.  Consults called: PCCM consulted by EDP  Admission status: Inpatient status.   Status is: Inpatient The patient will require at least 2 midnights for further evaluation and treatment of present condition.   Kayleen Memos MD [facility] Pager [phone]  If 7PM-7AM, please contact night-coverage [email or web address] Password [email or web address]  09/18/2022, 1:53 AM

## 2022-09-18 NOTE — ED Notes (Addendum)
Pt decompensating on bipap, decision to intubate    '20mg'$  etomidate given 0259 '100mg'$  rocc given 0300    Pt intubated with a 7.5 tube 24 at the teeth  Propofol started for sedation at 44mg/kg

## 2022-09-18 NOTE — Progress Notes (Signed)
61 year old male with end-stage renal disease on hemodialysis, hypertension and polysubstance abuse who was admitted with acute hypoxic respiratory failure requiring endotracheal intubation in the setting of flash pulmonary edema and community-acquired pneumonia    Physical exam: General: Crtitically ill-appearing male, orally intubated.  Currently receiving dialysis HEENT: [place]/AT, eyes anicteric.  ETT and OGT in place Neuro: Sedated, not following commands.  Eyes are closed.  Pupils 3 mm bilateral reactive to light Chest: Crackles heard in right lower lobe, coarse breath sounds, no wheezes or rhonchi Heart: Regular rate and rhythm, no murmurs or gallops Abdomen: Soft, nondistended, bowel sounds present Skin: No rash  Labs: ABGs 7.33/50/86/96% Na 130 Serum creatinine 7.8 Serum phosphorus 6.4 Serum troponin 124 WBC 12.9 H/H 11.6/34.8  Assessment and plan: Acute hypoxic respiratory failure Right lower lobe community-acquired pneumonia Uncontrolled hypertension Flash Pulmonary edema End-stage renal disease on hemodialysis Hypophosphatemia High anion gap metabolic acidosis Demand cardiac ischemia Acute metabolic encephalopathy in the setting of hypoxia Hypervolemic hyponatremia  Continue protective ventilation PAD protocol VAP bundle in place Continue IV antibiotic with ceftriaxone azithromycin Follow-up cultures Blood pressure is better controlled Receiving hemodialysis now Trend troponin Avoid deep sedation Closely monitor electrolytes  This patient is critically ill with multiple organ system failure which requires frequent high complexity decision making, assessment, support, evaluation, and titration of therapies. This was completed through the application of advanced monitoring technologies and extensive interpretation of multiple databases.  During this encounter critical care time was devoted to patient care services described in this note for 32 minutes.    Jacky Kindle, MD [place] See Amion for pager If no response to pager, please call [phone] until 7pm After 7pm, Please call E-link [phone]

## 2022-09-18 NOTE — Progress Notes (Addendum)
eLink Physician-Brief Progress Note Patient Name: Aleph Nickson DOB: 12-Jan-1961 MRN: [id number]   Date of Service  09/18/2022  HPI/Events of Note  61 year old male with pertinent PMH of ESRD on HD TTS, HTN, polysubstance abuse admitted to [facility] ED on 12/4 with SOB , refused Bipap, obtunded, now in ICU on Ventilator.  Camera: VS stable. Lung protective ventilation, in synchrony. Afebrile  Data: Reviewed 7.33/50/86 Sodium 130. K 4.2 LFT ok LA 1 Hg 11.6, wbc 12.9 EKG sinus tachy, ST down laterally, non specific.  Elevated pro BNP and type 2 rise troponin, moslty  A/P: AHRF from fluid overload, RLL Pneumonia. On Vent. - VAP bundle - daily SAT/SBT - on CAP coverage - get RVP/covid screening.  -echo - trend troponin.  2. ESRD HD TTS. - Nephro- for HD.  3. Hypertension. Stable post Vent.  4. Hyponatremia from above  5. Anemia of chronic CKD   eICU Interventions  Continue current care plan Restraints ordered per bed side request. For agitation, safety precautions to limit dangers of self extubation. VTE: SQ heparin      Intervention Category Major Interventions: Respiratory failure - evaluation and management Evaluation Type: New Patient Evaluation  Elmer Sow 09/18/2022, 6:12 AM

## 2022-09-18 NOTE — ED Notes (Signed)
[place] scan showed 13m, attempted In and out for urine collection, unable to get enough urine for a sample

## 2022-09-18 NOTE — Consult Note (Signed)
NAME:  Timothy [name], MRN:  [id number], DOB:  01-25-1961, LOS: 0 ADMISSION DATE:  09/17/2022, CONSULTATION DATE:  12/4 REFERRING MD:  Dr. Nevada Crane, CHIEF COMPLAINT:  Acute respiratory failure    History of Present Illness:  Patient is a 61 year old male with pertinent PMH of ESRD on HD TTS, HTN, polysubstance abuse admitted to [facility] ED on 12/4 with SOB.  Patient recently admitted to [facility] on 10/28 and discharged 10/30 for GI bleed.  On 12/4 patient was developing respiratory distress and called EMS.  States he is having chest pain/tightness.  States he has been using too much "ice".  EMS placed on NRB gave 0.4 nitro.  Patient states he only received half session of dialysis a "few days ago".  Transferred to [facility] ED.  Upon arrival to [facility] ED on 12/4, patient with increased WOB.  Afebrile and hypertensive 179/143.  Was placed on BiPAP.  Bilateral JVD and peripheral edema appreciated.  Patient also lethargic but arousable to voice.  EKG with no ST elevation.  Troponin 126 and BNP 4500.  WBC 12.1.  Nephro consulted for dialysis.  VBG WNL.  CXR concerning for RLL pneumonia given Rocephin/azithromycin.  Patient not tolerating BiPAP and requiring to be intubated.  PCCM consulted for ICU admission.  Pertinent  Medical History   Past Medical History:  Diagnosis Date   Acute metabolic encephalopathy 01/60/1093   Anemia of chronic kidney failure    BPH (benign prostatic hyperplasia)    Colon cancer ([place]) 2014   spouse states he had surgury for colon CA   End stage renal disease on dialysis ([place]) 2017   TTHSat   Hepatitis C    treated   Hypertension    Hypertensive crisis 04/07/2022   Hypertensive heart disease with chronic diastolic congestive heart failure ([place]) 07/17/2016   Polysubstance abuse ([place])    History of heroin and marijuana use   Thoracic aortic aneurysm ([place])    followed by Dr. Ellyn Hack     [facility] Events: Including procedures, antibiotic start and stop dates in addition to  other pertinent events   12/4 admitted to [facility] ED with SOB; intubated; PCCM consulted  Interim History / Subjective:  See above  Objective   Blood pressure (!) 173/122, pulse 94, temperature 98.7 F (37.1 C), temperature source Oral, resp. rate (!) 27, weight 79.4 kg, SpO2 96 %.    Vent Mode: PRVC FiO2 (%):  [40 %-50 %] 40 % Set Rate:  [8 bmp-24 bmp] 24 bmp Vt Set:  [630 mL] 630 mL PEEP:  [5 cmH20] 5 cmH20 Plateau Pressure:  [22 cmH20] 22 cmH20   Intake/Output Summary (Last 24 hours) at 09/18/2022 0604 Last data filed at 09/18/2022 0229 Gross per 24 hour  Intake 100 ml  Output --  Net 100 ml    Filed Weights   09/18/22 0400  Weight: 79.4 kg    Examination: General:  critically ill appearing on mech vent HEENT: MM pink/moist; JVD present; ETT in place Neuro: Aox3; MAE; sedated; CV: s1s2, RRR, no m/r/g PULM:  coarse rhonchi BS bilaterally; on mech vent PRVC GI: soft, bsx4 active  Extremities: warm/dry, BLE edema; LUE fistula with no erythema Skin: no rashes or lesions appreciated   Resolved Hospital Problem list     Assessment & Plan:   Acute respiratory failure with hypoxia: Likely due to volume overload RLL pneumonia P: -CXR showing ETT 6-7 cm above carina; advance ETT 2 cm -last abg 7.33 and co2 50; RR increased to 24 from  20 -LTVV strategy with tidal volumes of 6-8 cc/kg ideal body weight -Wean PEEP/FiO2 for SpO2 >92% -VAP bundle in place -Daily SAT and SBT -PAD protocol in place -wean sedation for RASS goal 0 to -1 -Rocephin/azithromycin for CAP PPx -Send tracheal aspirate and PCT -check RVP and covid/flu -send urine legionella/strep if able to make urine -HD for volume removal  ESRD on HD TTS AGMA P: -Dialysis per nephro -Trend BMP / urinary output -Replace electrolytes as indicated -Avoid nephrotoxic agents, ensure adequate renal perfusion   Elevated troponin: Likely demand ischemia from hypoxia Elevated BNP P: -HD per nephro -echo  ordered  Uncontrolled HTN P: -Was given nitro in route -As needed hydralazine -HD per nephro -consider resuming home bp meds later today  Acute encephalopathy: Hypoxia related?;  Also states he is using too much "ice" -Patient was initially more alert on arrival then became more lethargic prior to intubation but arousable P: -Send ethanol level and ammonia -if able to make urine send UA and UDS  Hyponatremia: Likely hypervolemia P: -HD per nephro -Trend BMP  Anemia of chronic disease P: -Trend CBC  Elevated alk phos P: -Trend CMP  Hx of polysubstance abuse Chronic anxiety/depression P: -hold home cymbalta  GERD P: -H2B  Best Practice (right click and "Reselect all SmartList Selections" daily)   Diet/type: NPO w/ meds via tube DVT prophylaxis: prophylactic heparin  GI prophylaxis: H2B Lines: N/A Foley:  N/A Code Status:  full code Last date of multidisciplinary goals of care discussion [12/4 attempted to call spouse Angela Nevin but no answer]  Labs   CBC: Recent Labs  Lab 09/18/22 0002 09/18/22 0011 09/18/22 0413 09/18/22 0424  WBC 12.1*  --   --  12.9*  NEUTROABS  --   --   --  11.3*  HGB 10.4* 11.9* 12.9* 11.6*  HCT 32.0* 35.0* 38.0* 34.8*  MCV 89.1  --   --  89.0  PLT 294  --   --  312     Basic Metabolic Panel: Recent Labs  Lab 09/18/22 0002 09/18/22 0011 09/18/22 0413 09/18/22 0424  NA 131* 131* 130* 130*  K 4.5 4.3 4.2 4.2  CL 91*  --   --  88*  CO2 23  --   --  25  GLUCOSE 125*  --   --  127*  BUN 78*  --   --  80*  CREATININE 7.81*  --   --  7.87*  CALCIUM 8.2*  --   --  8.4*  MG  --   --   --  2.3  PHOS  --   --   --  6.4*    GFR: Estimated Creatinine Clearance: 11.1 mL/min (A) (by C-G formula based on SCr of 7.87 mg/dL (H)). Recent Labs  Lab 09/18/22 0002 09/18/22 0424  WBC 12.1* 12.9*  LATICACIDVEN 0.9 1.0     Liver Function Tests: Recent Labs  Lab 09/18/22 0002 09/18/22 0424  AST 24 27  ALT 18 18  ALKPHOS 150*  169*  BILITOT 0.8 0.8  PROT 7.6 8.3*  ALBUMIN 2.9* 3.1*    No results for input(s): "LIPASE", "AMYLASE" in the last 168 hours. No results for input(s): "AMMONIA" in the last 168 hours.  ABG    Component Value Date/Time   PHART 7.338 (L) 09/18/2022 0413   PCO2ART 50.3 (H) 09/18/2022 0413   PO2ART 86 09/18/2022 0413   HCO3 27.0 09/18/2022 0413   TCO2 29 09/18/2022 0413   ACIDBASEDEF 4.0 (H) 09/09/2016 6811  O2SAT 96 09/18/2022 0413     Coagulation Profile: Recent Labs  Lab 09/18/22 0002  INR 1.3*     Cardiac Enzymes: No results for input(s): "CKTOTAL", "CKMB", "CKMBINDEX", "TROPONINI" in the last 168 hours.  HbA1C: No results found for: "HGBA1C"  CBG: No results for input(s): "GLUCAP" in the last 168 hours.  Review of Systems:   Patient is encephalopathic and/or intubated. Therefore history has been obtained from chart review.    Past Medical History:  He,  has a past medical history of Acute metabolic encephalopathy (84/66/5993), Anemia of chronic kidney failure, BPH (benign prostatic hyperplasia), Colon cancer ([place]) (2014), End stage renal disease on dialysis ([place]) (2017), Hepatitis C, Hypertension, Hypertensive crisis (04/07/2022), Hypertensive heart disease with chronic diastolic congestive heart failure ([place]) (07/17/2016), Polysubstance abuse ([place]), and Thoracic aortic aneurysm ([place]).   Surgical History:   Past Surgical History:  Procedure Laterality Date   A/V FISTULAGRAM Left 12/02/2021   Procedure: A/V Fistulagram;  Surgeon: Cherre Robins, MD;  Location: [place] [facility];  Service: Cardiovascular;  Laterality: Left;   A/V FISTULAGRAM Left 04/24/2022   Procedure: A/V Fistulagram;  Surgeon: Waynetta Sandy, MD;  Location: [place] [facility];  Service: Cardiovascular;  Laterality: Left;   ABDOMINAL SURGERY     AV FISTULA PLACEMENT Left 09/19/2016   Procedure: Left arm Radiocephalic ARTERIOVENOUS (AV) FISTULA CREATION;  Surgeon: Conrad McLemoresville, MD;   Location: [place];  Service: Vascular;  Laterality: Left;   BASCILIC VEIN TRANSPOSITION Left 07/09/2017   Procedure: BRACHIOCEPHALIC FISTULA CREATION;  Surgeon: Conrad [place], MD;  Location: [place];  Service: Vascular;  Laterality: Left;   BIOPSY  07/05/2022   Procedure: BIOPSY;  Surgeon: Thornton Park, MD;  Location: [place];  Service: Gastroenterology;;   COLON SURGERY  2014   COLONOSCOPY N/A 07/12/2022   Procedure: COLONOSCOPY;  Surgeon: Doran Stabler, MD;  Location: [place];  Service: Gastroenterology;  Laterality: N/A;   COLONOSCOPY WITH PROPOFOL N/A 07/05/2022   Procedure: COLONOSCOPY WITH PROPOFOL;  Surgeon: Thornton Park, MD;  Location: [place];  Service: Gastroenterology;  Laterality: N/A;   ENTEROSCOPY N/A 07/28/2022   Procedure: ENTEROSCOPY;  Surgeon: [name], MD;  Location: [facility] ENDOSCOPY;  Service: Gastroenterology;  Laterality: N/A;   ESOPHAGOGASTRODUODENOSCOPY (EGD) WITH PROPOFOL N/A 11/05/2021   Procedure: ESOPHAGOGASTRODUODENOSCOPY (EGD) WITH PROPOFOL;  Surgeon: Carol Ada, MD;  Location: [place];  Service: Endoscopy;  Laterality: N/A;   ESOPHAGOGASTRODUODENOSCOPY (EGD) WITH PROPOFOL N/A 11/14/2021   Procedure: ESOPHAGOGASTRODUODENOSCOPY (EGD) WITH PROPOFOL;  Surgeon: [name], MD;  Location: [place];  Service: Gastroenterology;  Laterality: N/A;   ESOPHAGOGASTRODUODENOSCOPY (EGD) WITH PROPOFOL N/A 11/16/2021   Procedure: ESOPHAGOGASTRODUODENOSCOPY (EGD) WITH PROPOFOL;  Surgeon: [name], MD;  Location: [place];  Service: Gastroenterology;  Laterality: N/A;   ESOPHAGOGASTRODUODENOSCOPY (EGD) WITH PROPOFOL N/A 07/05/2022   Procedure: ESOPHAGOGASTRODUODENOSCOPY (EGD) WITH PROPOFOL;  Surgeon: Thornton Park, MD;  Location: [place];  Service: Gastroenterology;  Laterality: N/A;   ESOPHAGOGASTRODUODENOSCOPY (EGD) WITH PROPOFOL N/A 07/12/2022   Procedure: ESOPHAGOGASTRODUODENOSCOPY (EGD) WITH PROPOFOL;  Surgeon: Doran Stabler, MD;  Location: [place];  Service: Gastroenterology;  Laterality: N/A;   ESOPHAGOGASTRODUODENOSCOPY (EGD) WITH PROPOFOL N/A 07/31/2022   Procedure: ESOPHAGOGASTRODUODENOSCOPY (EGD) WITH PROPOFOL;  Surgeon: Ladene Artist, MD;  Location: [place];  Service: Gastroenterology;  Laterality: N/A;   ESOPHAGOGASTRODUODENOSCOPY (EGD) WITH PROPOFOL N/A 08/13/2022   Procedure: ESOPHAGOGASTRODUODENOSCOPY (EGD) WITH PROPOFOL;  Surgeon: Gatha Mayer, MD;  Location: [place];  Service:  Gastroenterology;  Laterality: N/A;   HEMOSTASIS CLIP PLACEMENT  11/16/2021   Procedure: HEMOSTASIS CLIP PLACEMENT;  Surgeon: [name], MD;  Location: [place] ENDOSCOPY;  Service: Gastroenterology;;   HEMOSTASIS CLIP PLACEMENT  07/05/2022   Procedure: HEMOSTASIS CLIP PLACEMENT;  Surgeon: Thornton Park, MD;  Location: [facility] ENDOSCOPY;  Service: Gastroenterology;;   HEMOSTASIS CLIP PLACEMENT  08/13/2022   Procedure: HEMOSTASIS CLIP PLACEMENT;  Surgeon: Gatha Mayer, MD;  Location: [facility] ENDOSCOPY;  Service: Gastroenterology;;   HEMOSTASIS CONTROL  11/05/2021   Procedure: HEMOSTASIS CONTROL;  Surgeon: Carol Ada, MD;  Location: [place];  Service: Endoscopy;;   HEMOSTASIS CONTROL  11/16/2021   Procedure: HEMOSTASIS CONTROL;  Surgeon: [name], MD;  Location: [place];  Service: Gastroenterology;;   HEMOSTASIS CONTROL  07/05/2022   Procedure: HEMOSTASIS CONTROL;  Surgeon: Thornton Park, MD;  Location: [place];  Service: Gastroenterology;;   HOT HEMOSTASIS N/A 11/16/2021   Procedure: HOT HEMOSTASIS (ARGON PLASMA COAGULATION/BICAP);  Surgeon: [name], MD;  Location: [place];  Service: Gastroenterology;  Laterality: N/A;   HOT HEMOSTASIS N/A 08/13/2022   Procedure: HOT HEMOSTASIS (ARGON PLASMA COAGULATION/BICAP);  Surgeon: Gatha Mayer, MD;  Location: [place];  Service: Gastroenterology;  Laterality: N/A;   INSERTION OF DIALYSIS CATHETER N/A 09/19/2016   Procedure: INSERTION  OF TUNNELED DIALYSIS CATHETER;  Surgeon: Conrad Elgin, MD;  Location: [place];  Service: Vascular;  Laterality: N/A;   IR ANGIOGRAM SELECTIVE EACH ADDITIONAL VESSEL  11/16/2021   IR ANGIOGRAM VISCERAL SELECTIVE  11/05/2021   IR ANGIOGRAM VISCERAL SELECTIVE  11/16/2021   IR ANGIOGRAM VISCERAL SELECTIVE  11/16/2021   IR EMBO ART  VEN HEMORR LYMPH EXTRAV  INC GUIDE ROADMAPPING  11/05/2021   IR EMBO ART  VEN HEMORR LYMPH EXTRAV  INC GUIDE ROADMAPPING  11/16/2021   IR GENERIC HISTORICAL  09/14/2016   IR US GUIDE VASC ACCESS RIGHT 09/14/2016 [name], DO [facility]   IR GENERIC HISTORICAL  09/14/2016   IR FLUORO GUIDE CV LINE RIGHT 09/14/2016 [name], DO [facility]   IR PARACENTESIS  06/22/2021   IR US GUIDE [place] RIGHT  11/05/2021   IR US GUIDE [place] RIGHT  11/16/2021   LAPAROTOMY N/A 05/23/2021   Procedure: EXPLORATORY LAPAROTOMY LYSIS ADHESIONS;  Surgeon: [name], MD;  Location: [place];  Service: General;  Laterality: N/A;   LAPAROTOMY N/A 11/20/2021   Procedure: EXPLORATORY LAPAROTOMY, REPAIR OF BLEEDING DUODENAL ULCER;  Surgeon: Dwan Bolt, MD;  Location: [place];  Service: General;  Laterality: N/A;   ORIF TIBIA PLATEAU Left 01/15/2018   Procedure: OPEN REDUCTION INTERNAL FIXATION (ORIF) TIBIAL PLATEAU;  Surgeon: Altamese Belleview, MD;  Location: [place];  Service: Orthopedics;  Laterality: Left;   REVISON OF ARTERIOVENOUS FISTULA Left 05/26/2022   Procedure: REVISION OF LEFT ARM ARTERIOVENOUS FISTULA WITH PLICATION OF PSEUDOANEURYSM;  Surgeon: Waynetta Sandy, MD;  Location: [place];  Service: Vascular;  Laterality: Left;   SCLEROTHERAPY  11/05/2021   Procedure: Clide Deutscher;  Surgeon: Carol Ada, MD;  Location: [place];  Service: Endoscopy;;   SCLEROTHERAPY  11/16/2021   Procedure: Clide Deutscher;  Surgeon: [name], MD;  Location: [facility] ENDOSCOPY;  Service: Gastroenterology;;   TRANSTHORACIC ECHOCARDIOGRAM  07/2016    EF 60-65%, No RWMA. Mod Concentric LVH -  Gr 2 DD. Severe LA dilation. PAP ~35 mmHg (mild Pulm HTN)  --> no changes noted 1 month later     Social History:   reports that he quit smoking about  4 months ago. His smoking use included cigarettes. He smoked an average of .25 packs per day. He has never been exposed to tobacco smoke. He has never used smokeless tobacco. He reports current drug use. Frequency: 1.00 time per week. Drugs: Marijuana and Heroin. He reports that he does not drink alcohol.   Family History:  His family history includes CAD in his nephew; Diabetes Mellitus II in his mother; Heart attack (age of onset: 51) in his mother; Heart failure in his mother; Hypertension in his mother; Kidney failure in his sister; Other in his father and another family member.   Allergies No Known Allergies   Home Medications  Prior to Admission medications   Medication Sig Start Date End Date Taking? Authorizing Provider  albuterol (VENTOLIN HFA) 108 (90 Base) MCG/ACT inhaler Inhale 2 puffs into the lungs every 6 (six) hours as needed for wheezing or shortness of breath. 01/06/22   [provider]  carvedilol (COREG) 6.25 MG tablet Take 1 tablet (6.25 mg total) by mouth 2 (two) times daily with a meal. 08/02/22 09/01/22  Arrien, Jimmy Picket, MD  diltiazem (CARDIZEM CD) 360 MG 24 hr capsule Take 1 capsule (360 mg total) by mouth daily. 08/03/22 09/02/22  Arrien, Jimmy Picket, MD  DULoxetine (CYMBALTA) 20 MG capsule Take 20 mg by mouth daily. 01/06/22   [provider]  hydrALAZINE (APRESOLINE) 50 MG tablet Take 1 tablet (50 mg total) by mouth 2 (two) times daily. 08/02/22 09/01/22  Arrien, Jimmy Picket, MD  lidocaine (LMX) 4 % cream Apply 1 Application topically 3 (three) times a week. 06/27/22   [provider]  LIDOCAINE-PRILOCAINE EX Apply 1 application  topically Every Tuesday,Thursday,and Saturday with dialysis.    [provider]  oxyCODONE (OXY IR/ROXICODONE) 5 MG immediate release tablet  Take 5 mg by mouth 3 (three) times daily as needed. 08/09/22   [provider]  pantoprazole (PROTONIX) 40 MG tablet Take 1 tablet (40 mg total) by mouth 2 (two) times daily before a meal. 08/14/22   Lavina Hamman, MD     Critical care time: 45 minutes    JD Rexene Agent [facility] 09/18/2022, 6:04 AM  Please see [email or web address] for pager details.  From 7A-7P if no response, please call [phone]. After hours, please call ELink [phone].

## 2022-09-18 NOTE — Procedures (Signed)
Intubation Procedure Note  Timothy [name]  [id number]  Sep 02, 1961  Date:09/18/22  Time:3:26 AM   Provider Performing:Kresse, Raelyn Number    Procedure: Intubation (38381)  Indication(s) Respiratory Failure  Consent Unable to obtain consent due to emergent nature of procedure.   Anesthesia Per MD   Time Out Verified patient identification, verified procedure, site/side was marked, verified correct patient position, special equipment/implants available, medications/allergies/relevant history reviewed, required imaging and test results available.   Sterile Technique Usual hand hygeine, masks, and gloves were used   Procedure Description Patient positioned in bed supine.  Sedation given as noted above.  Patient was intubated with endotracheal tube using Glidescope.  View was Grade 1 full glottis .  Number of attempts was 1.  Colorimetric CO2 detector was consistent with tracheal placement.   Complications/Tolerance None; patient tolerated the procedure well. Chest X-ray is ordered to verify placement.   EBL Minimal   Specimen(s) None

## 2022-09-18 NOTE — H&P (Deleted)
NAME:  Timothy [name], MRN:  [id number], DOB:  Jun 21, 1961, LOS: 0 ADMISSION DATE:  09/17/2022, CONSULTATION DATE:  12/4 REFERRING MD:  Dr. Nevada Crane, CHIEF COMPLAINT:  Acute respiratory failure    History of Present Illness:  Patient is a 61 year old male with pertinent PMH of ESRD on HD TTS, HTN, polysubstance abuse admitted to [facility] ED on 12/4 with SOB.  Patient recently admitted to [facility] on 10/28 and discharged 10/30 for GI bleed.  On 12/4 patient was developing respiratory distress and called EMS.  States he is having chest pain/tightness.  States he has been using too much "ice".  EMS placed on NRB gave 0.4 nitro.  Patient states he only received half session of dialysis a "few days ago".  Transferred to [facility] ED.  Upon arrival to [facility] ED on 12/4, patient with increased WOB.  Afebrile and hypertensive 179/143.  Was placed on BiPAP.  Bilateral JVD and peripheral edema appreciated.  Patient also lethargic but arousable to voice.  EKG with no ST elevation.  Troponin 126 and BNP 4500.  WBC 12.1.  Nephro consulted for dialysis.  VBG WNL.  CXR concerning for RLL pneumonia given Rocephin/azithromycin.  Patient not tolerating BiPAP and requiring to be intubated.  PCCM consulted for ICU admission.  Pertinent  Medical History   Past Medical History:  Diagnosis Date   Acute metabolic encephalopathy 65/68/1275   Anemia of chronic kidney failure    BPH (benign prostatic hyperplasia)    Colon cancer ([place]) 2014   spouse states he had surgury for colon CA   End stage renal disease on dialysis ([place]) 2017   TTHSat   Hepatitis C    treated   Hypertension    Hypertensive crisis 04/07/2022   Hypertensive heart disease with chronic diastolic congestive heart failure ([place]) 07/17/2016   Polysubstance abuse ([employer])    History of heroin and marijuana use   Thoracic aortic aneurysm ([place])    followed by Dr. Ellyn Hack     [facility] Events: Including procedures, antibiotic start and stop dates in addition to  other pertinent events   12/4 admitted to [facility] ED with SOB; intubated; PCCM consulted  Interim History / Subjective:  See above  Objective   Blood pressure (!) 196/123, pulse (!) 101, temperature 98.7 F (37.1 C), temperature source Oral, resp. rate (!) 27, SpO2 98 %.    Vent Mode: PRVC FiO2 (%):  [40 %-50 %] 40 % Set Rate:  [8 bmp-20 bmp] 20 bmp Vt Set:  [630 mL] 630 mL PEEP:  [5 cmH20] 5 cmH20 Plateau Pressure:  [22 cmH20] 22 cmH20   Intake/Output Summary (Last 24 hours) at 09/18/2022 0401 Last data filed at 09/18/2022 0229 Gross per 24 hour  Intake 100 ml  Output --  Net 100 ml   There were no vitals filed for this visit.  Examination: General:  critically ill appearing on mech vent HEENT: MM pink/moist; JVD present; ETT in place Neuro: Aox3; MAE; sedated; CV: s1s2, RRR, no m/r/g PULM:  coarse rhonchi BS bilaterally; on mech vent PRVC GI: soft, bsx4 active  Extremities: warm/dry, BLE edema; LUE fistula with no erythema Skin: no rashes or lesions appreciated   Resolved Hospital Problem list     Assessment & Plan:   Acute respiratory failure with hypoxia: Likely due to volume overload RLL pneumonia P: -CXR showing ETT 6-7 cm above carina; advance ETT 2 cm -last abg 7.33 and co2 50; RR increased to 24 from 20 -LTVV strategy with tidal volumes of  6-8 cc/kg ideal body weight -Wean PEEP/FiO2 for SpO2 >92% -VAP bundle in place -Daily SAT and SBT -PAD protocol in place -wean sedation for RASS goal 0 to -1 -Rocephin/azithromycin for CAP PPx -Send tracheal aspirate and PCT -check RVP and covid/flu -send urine legionella/strep if able to make urine -HD for volume removal  ESRD on HD TTS AGMA P: -Dialysis per nephro -Trend BMP / urinary output -Replace electrolytes as indicated -Avoid nephrotoxic agents, ensure adequate renal perfusion   Elevated troponin: Likely demand ischemia from hypoxia Elevated BNP P: -HD per nephro -echo ordered  Uncontrolled  HTN P: -Was given nitro in route -As needed hydralazine -HD per nephro -consider resuming home bp meds later today  Acute encephalopathy: Hypoxia related?;  Also states he is using too much "ice" -Patient was initially more alert on arrival then became more lethargic prior to intubation but arousable P: -Send ethanol level and ammonia -if able to make urine send UA and UDS  Hyponatremia: Likely hypervolemia P: -HD per nephro -Trend BMP  Anemia of chronic disease P: -Trend CBC  Elevated alk phos P: -Trend CMP  Hx of polysubstance abuse Chronic anxiety/depression P: -hold home cymbalta  GERD P: -H2B  Best Practice (right click and "Reselect all SmartList Selections" daily)   Diet/type: NPO w/ meds via tube DVT prophylaxis: prophylactic heparin  GI prophylaxis: H2B Lines: N/A Foley:  N/A Code Status:  full code Last date of multidisciplinary goals of care discussion [12/4 attempted to call spouse Angela Nevin but no answer]  Labs   CBC: Recent Labs  Lab 09/18/22 0002 09/18/22 0011  WBC 12.1*  --   HGB 10.4* 11.9*  HCT 32.0* 35.0*  MCV 89.1  --   PLT 294  --     Basic Metabolic Panel: Recent Labs  Lab 09/18/22 0002 09/18/22 0011  NA 131* 131*  K 4.5 4.3  CL 91*  --   CO2 23  --   GLUCOSE 125*  --   BUN 78*  --   CREATININE 7.81*  --   CALCIUM 8.2*  --    GFR: CrCl cannot be calculated (Unknown ideal weight.). Recent Labs  Lab 09/18/22 0002  WBC 12.1*  LATICACIDVEN 0.9    Liver Function Tests: Recent Labs  Lab 09/18/22 0002  AST 24  ALT 18  ALKPHOS 150*  BILITOT 0.8  PROT 7.6  ALBUMIN 2.9*   No results for input(s): "LIPASE", "AMYLASE" in the last 168 hours. No results for input(s): "AMMONIA" in the last 168 hours.  ABG    Component Value Date/Time   PHART 7.433 04/06/2022 2134   PCO2ART 46.1 04/06/2022 2134   PO2ART 82 (L) 04/06/2022 2134   HCO3 27.5 09/18/2022 0011   TCO2 29 09/18/2022 0011   ACIDBASEDEF 4.0 (H) 09/09/2016  0931   O2SAT 96 09/18/2022 0011     Coagulation Profile: Recent Labs  Lab 09/18/22 0002  INR 1.3*    Cardiac Enzymes: No results for input(s): "CKTOTAL", "CKMB", "CKMBINDEX", "TROPONINI" in the last 168 hours.  HbA1C: No results found for: "HGBA1C"  CBG: No results for input(s): "GLUCAP" in the last 168 hours.  Review of Systems:   Patient is encephalopathic and/or intubated. Therefore history has been obtained from chart review.    Past Medical History:  He,  has a past medical history of Acute metabolic encephalopathy (34/28/7681), Anemia of chronic kidney failure, BPH (benign prostatic hyperplasia), Colon cancer ([place]) (2014), End stage renal disease on dialysis ([place]) (2017), Hepatitis C, Hypertension, Hypertensive  crisis (04/07/2022), Hypertensive heart disease with chronic diastolic congestive heart failure ([place]) (07/17/2016), Polysubstance abuse ([place]), and Thoracic aortic aneurysm ([place]).   Surgical History:   Past Surgical History:  Procedure Laterality Date   A/V FISTULAGRAM Left 12/02/2021   Procedure: A/V Fistulagram;  Surgeon: Cherre Robins, MD;  Location: [place] [facility];  Service: Cardiovascular;  Laterality: Left;   A/V FISTULAGRAM Left 04/24/2022   Procedure: A/V Fistulagram;  Surgeon: Waynetta Sandy, MD;  Location: [place] [facility];  Service: Cardiovascular;  Laterality: Left;   ABDOMINAL SURGERY     AV FISTULA PLACEMENT Left 09/19/2016   Procedure: Left arm Radiocephalic ARTERIOVENOUS (AV) FISTULA CREATION;  Surgeon: Conrad Duncan, MD;  Location: [place];  Service: Vascular;  Laterality: Left;   BASCILIC VEIN TRANSPOSITION Left 07/09/2017   Procedure: BRACHIOCEPHALIC FISTULA CREATION;  Surgeon: Conrad [place], MD;  Location: [place];  Service: Vascular;  Laterality: Left;   BIOPSY  07/05/2022   Procedure: BIOPSY;  Surgeon: Thornton Park, MD;  Location: [place];  Service: Gastroenterology;;   COLON SURGERY  2014   COLONOSCOPY N/A 07/12/2022    Procedure: COLONOSCOPY;  Surgeon: Doran Stabler, MD;  Location: [place];  Service: Gastroenterology;  Laterality: N/A;   COLONOSCOPY WITH PROPOFOL N/A 07/05/2022   Procedure: COLONOSCOPY WITH PROPOFOL;  Surgeon: Thornton Park, MD;  Location: [place];  Service: Gastroenterology;  Laterality: N/A;   ENTEROSCOPY N/A 07/28/2022   Procedure: ENTEROSCOPY;  Surgeon: [name], MD;  Location: [facility] ENDOSCOPY;  Service: Gastroenterology;  Laterality: N/A;   ESOPHAGOGASTRODUODENOSCOPY (EGD) WITH PROPOFOL N/A 11/05/2021   Procedure: ESOPHAGOGASTRODUODENOSCOPY (EGD) WITH PROPOFOL;  Surgeon: Carol Ada, MD;  Location: [place];  Service: Endoscopy;  Laterality: N/A;   ESOPHAGOGASTRODUODENOSCOPY (EGD) WITH PROPOFOL N/A 11/14/2021   Procedure: ESOPHAGOGASTRODUODENOSCOPY (EGD) WITH PROPOFOL;  Surgeon: [name], MD;  Location: [place];  Service: Gastroenterology;  Laterality: N/A;   ESOPHAGOGASTRODUODENOSCOPY (EGD) WITH PROPOFOL N/A 11/16/2021   Procedure: ESOPHAGOGASTRODUODENOSCOPY (EGD) WITH PROPOFOL;  Surgeon: [name], MD;  Location: [place];  Service: Gastroenterology;  Laterality: N/A;   ESOPHAGOGASTRODUODENOSCOPY (EGD) WITH PROPOFOL N/A 07/05/2022   Procedure: ESOPHAGOGASTRODUODENOSCOPY (EGD) WITH PROPOFOL;  Surgeon: Thornton Park, MD;  Location: [place];  Service: Gastroenterology;  Laterality: N/A;   ESOPHAGOGASTRODUODENOSCOPY (EGD) WITH PROPOFOL N/A 07/12/2022   Procedure: ESOPHAGOGASTRODUODENOSCOPY (EGD) WITH PROPOFOL;  Surgeon: Doran Stabler, MD;  Location: [place];  Service: Gastroenterology;  Laterality: N/A;   ESOPHAGOGASTRODUODENOSCOPY (EGD) WITH PROPOFOL N/A 07/31/2022   Procedure: ESOPHAGOGASTRODUODENOSCOPY (EGD) WITH PROPOFOL;  Surgeon: Ladene Artist, MD;  Location: [place];  Service: Gastroenterology;  Laterality: N/A;   ESOPHAGOGASTRODUODENOSCOPY (EGD) WITH PROPOFOL N/A 08/13/2022   Procedure: ESOPHAGOGASTRODUODENOSCOPY (EGD)  WITH PROPOFOL;  Surgeon: Gatha Mayer, MD;  Location: [place];  Service: Gastroenterology;  Laterality: N/A;   HEMOSTASIS CLIP PLACEMENT  11/16/2021   Procedure: HEMOSTASIS CLIP PLACEMENT;  Surgeon: [name], MD;  Location: [facility] ENDOSCOPY;  Service: Gastroenterology;;   HEMOSTASIS CLIP PLACEMENT  07/05/2022   Procedure: HEMOSTASIS CLIP PLACEMENT;  Surgeon: Thornton Park, MD;  Location: [facility] ENDOSCOPY;  Service: Gastroenterology;;   HEMOSTASIS CLIP PLACEMENT  08/13/2022   Procedure: HEMOSTASIS CLIP PLACEMENT;  Surgeon: Gatha Mayer, MD;  Location: [facility] ENDOSCOPY;  Service: Gastroenterology;;   HEMOSTASIS CONTROL  11/05/2021   Procedure: HEMOSTASIS CONTROL;  Surgeon: Carol Ada, MD;  Location: [place];  Service: Endoscopy;;   HEMOSTASIS CONTROL  11/16/2021   Procedure: HEMOSTASIS CONTROL;  Surgeon: [name],  MD;  Location: [place];  Service: Gastroenterology;;   HEMOSTASIS CONTROL  07/05/2022   Procedure: HEMOSTASIS CONTROL;  Surgeon: Thornton Park, MD;  Location: [place];  Service: Gastroenterology;;   HOT HEMOSTASIS N/A 11/16/2021   Procedure: HOT HEMOSTASIS (ARGON PLASMA COAGULATION/BICAP);  Surgeon: [name], MD;  Location: [place];  Service: Gastroenterology;  Laterality: N/A;   HOT HEMOSTASIS N/A 08/13/2022   Procedure: HOT HEMOSTASIS (ARGON PLASMA COAGULATION/BICAP);  Surgeon: Gatha Mayer, MD;  Location: [place];  Service: Gastroenterology;  Laterality: N/A;   INSERTION OF DIALYSIS CATHETER N/A 09/19/2016   Procedure: INSERTION OF TUNNELED DIALYSIS CATHETER;  Surgeon: Conrad [place], MD;  Location: [place];  Service: Vascular;  Laterality: N/A;   IR ANGIOGRAM SELECTIVE EACH ADDITIONAL VESSEL  11/16/2021   IR ANGIOGRAM VISCERAL SELECTIVE  11/05/2021   IR ANGIOGRAM VISCERAL SELECTIVE  11/16/2021   IR ANGIOGRAM VISCERAL SELECTIVE  11/16/2021   IR EMBO ART  VEN HEMORR LYMPH EXTRAV  INC GUIDE ROADMAPPING  11/05/2021   IR EMBO ART  VEN HEMORR LYMPH  EXTRAV  INC GUIDE ROADMAPPING  11/16/2021   IR GENERIC HISTORICAL  09/14/2016   IR US GUIDE VASC ACCESS RIGHT 09/14/2016 [name], DO [facility]   IR GENERIC HISTORICAL  09/14/2016   IR FLUORO GUIDE CV LINE RIGHT 09/14/2016 [name], DO [facility]   IR PARACENTESIS  06/22/2021   IR US GUIDE [place] RIGHT  11/05/2021   IR US GUIDE [place] RIGHT  11/16/2021   LAPAROTOMY N/A 05/23/2021   Procedure: EXPLORATORY LAPAROTOMY LYSIS ADHESIONS;  Surgeon: [name], MD;  Location: [place];  Service: General;  Laterality: N/A;   LAPAROTOMY N/A 11/20/2021   Procedure: EXPLORATORY LAPAROTOMY, REPAIR OF BLEEDING DUODENAL ULCER;  Surgeon: Dwan Bolt, MD;  Location: [place];  Service: General;  Laterality: N/A;   ORIF TIBIA PLATEAU Left 01/15/2018   Procedure: OPEN REDUCTION INTERNAL FIXATION (ORIF) TIBIAL PLATEAU;  Surgeon: Altamese Diggins, MD;  Location: [place];  Service: Orthopedics;  Laterality: Left;   REVISON OF ARTERIOVENOUS FISTULA Left 05/26/2022   Procedure: REVISION OF LEFT ARM ARTERIOVENOUS FISTULA WITH PLICATION OF PSEUDOANEURYSM;  Surgeon: Waynetta Sandy, MD;  Location: [place];  Service: Vascular;  Laterality: Left;   SCLEROTHERAPY  11/05/2021   Procedure: Clide Deutscher;  Surgeon: Carol Ada, MD;  Location: [place];  Service: Endoscopy;;   SCLEROTHERAPY  11/16/2021   Procedure: Clide Deutscher;  Surgeon: [name], MD;  Location: [facility] END[facility]COPY;  Service: Gastroenterology;;   TRANSTHORACIC ECHOCARDIOGRAM  07/2016    EF 60-65%, No RWMA. Mod Concentric LVH - Gr 2 DD. Severe LA dilation. PAP ~35 mmHg (mild Pulm HTN)  --> no changes noted 1 month later     Social History:   reports that he quit smoking about 4 months ago. His smoking use included cigarettes. He smoked an average of .25 packs per day. He has never been exposed to tobacco smoke. He has never used smokeless tobacco. He reports current drug use. Frequency: 1.00 time per week. Drugs: Marijuana and Heroin.  He reports that he does not drink alcohol.   Family History:  His family history includes CAD in his nephew; Diabetes Mellitus II in his mother; Heart attack (age of onset: 45) in his mother; Heart failure in his mother; Hypertension in his mother; Kidney failure in his sister; Other in his father and another family member.   Allergies No Known Allergies   Home Medications  Prior to Admission medications  Medication Sig Start Date End Date Taking? Authorizing Provider  albuterol (VENTOLIN HFA) 108 (90 Base) MCG/ACT inhaler Inhale 2 puffs into the lungs every 6 (six) hours as needed for wheezing or shortness of breath. 01/06/22   [provider]  carvedilol (COREG) 6.25 MG tablet Take 1 tablet (6.25 mg total) by mouth 2 (two) times daily with a meal. 08/02/22 09/01/22  Arrien, Jimmy Picket, MD  diltiazem (CARDIZEM CD) 360 MG 24 hr capsule Take 1 capsule (360 mg total) by mouth daily. 08/03/22 09/02/22  Arrien, Jimmy Picket, MD  DULoxetine (CYMBALTA) 20 MG capsule Take 20 mg by mouth daily. 01/06/22   [provider]  hydrALAZINE (APRESOLINE) 50 MG tablet Take 1 tablet (50 mg total) by mouth 2 (two) times daily. 08/02/22 09/01/22  Arrien, Jimmy Picket, MD  lidocaine (LMX) 4 % cream Apply 1 Application topically 3 (three) times a week. 06/27/22   [provider]  LIDOCAINE-PRILOCAINE EX Apply 1 application  topically Every Tuesday,Thursday,and Saturday with dialysis.    [provider]  oxyCODONE (OXY IR/ROXICODONE) 5 MG immediate release tablet Take 5 mg by mouth 3 (three) times daily as needed. 08/09/22   [provider]  pantoprazole (PROTONIX) 40 MG tablet Take 1 tablet (40 mg total) by mouth 2 (two) times daily before a meal. 08/14/22   Lavina Hamman, MD     Critical care time: 45 minutes    JD Rexene Agent [facility] 09/18/2022, 4:01 AM  Please see [email or web address] for pager details.  From 7A-7P if no response,  please call [phone]. After hours, please call ELink [phone].

## 2022-09-18 NOTE — ED Notes (Signed)
Pt continues to removed O2 and refused to allow staff to place mask back on him. O2 sats at 77% and RR 40.  Pt attempted to again educate pt on importance of keeping O2 sats up

## 2022-09-18 NOTE — Progress Notes (Signed)
Patient sedated d/t intubation  Informed consent signed and in chart.   Treatment initiated: 0807 Treatment completed: 1158  Patient tolerated well.  Patient sedated; response to pain and verbal stimulus. Deaccessed Dressing c/d/I. No s/s of bleeding upon leaving patient bedside.  Hand-off given to patient's nurse.   Access used: left AVF  Access issues: no issue  Total UF removed: 4000 ml Medication(s) given: none Post HD VS: 97.8, 65, 24, 117/88, 96% vent  Post HD weight: 66.9 kg   [facility]

## 2022-09-18 NOTE — ED Notes (Addendum)
Pt refusing to keep O2 mask on, states that " I dont have an oxygen problem, I have a fluid problem"

## 2022-09-18 NOTE — Progress Notes (Signed)
[facility] Progress Note   Subjective:   in ICU, was intubated overnight.  +RSV too.  On HD now - UF set for 4L.  Tolerating so far.    Objective Vitals:   09/18/22 0815 09/18/22 0830 09/18/22 0845 09/18/22 0900  BP: 115/83 118/86 114/81 (!) 146/98  Pulse: 61 (!) 56 (!) 55 64  Resp: '15 15 12 '$ (!) 24  Temp:      TempSrc:      SpO2: 98% 96% 98% 95%  Weight:      Height:       Physical Exam General: intubated and sedated Heart: RRR Lungs: coarse BL on vent Abdomen:soft Extremities: 1+ edema Dialysis Access:  LUE AVF + t/b  Additional Objective Labs: Basic Metabolic Panel: Recent Labs  Lab 09/18/22 0002 09/18/22 0011 09/18/22 0413 09/18/22 0424 09/18/22 0625  NA 131* 131* 130* 130*  --   K 4.5 4.3 4.2 4.2  --   CL 91*  --   --  88*  --   CO2 23  --   --  25  --   GLUCOSE 125*  --   --  127*  --   BUN 78*  --   --  80*  --   CREATININE 7.81*  --   --  7.87*  --   CALCIUM 8.2*  --   --  8.4*  --   PHOS  --   --   --  6.4* 6.0*   Liver Function Tests: Recent Labs  Lab 09/18/22 0002 09/18/22 0424  AST 24 27  ALT 18 18  ALKPHOS 150* 169*  BILITOT 0.8 0.8  PROT 7.6 8.3*  ALBUMIN 2.9* 3.1*   No results for input(s): "LIPASE", "AMYLASE" in the last 168 hours. CBC: Recent Labs  Lab 09/18/22 0002 09/18/22 0011 09/18/22 0413 09/18/22 0424  WBC 12.1*  --   --  12.9*  NEUTROABS  --   --   --  11.3*  HGB 10.4* 11.9* 12.9* 11.6*  HCT 32.0* 35.0* 38.0* 34.8*  MCV 89.1  --   --  89.0  PLT 294  --   --  312   Blood Culture    Component Value Date/Time   SDES PERITONEAL FLUID 06/22/2021 1236   [place] 06/22/2021 1236   CULT  06/22/2021 1236    NO GROWTH 3 DAYS Performed at [facility], [place] [address], [place], [place] [address]    REPTSTATUS 06/26/2021 FINAL 06/22/2021 1236    Cardiac Enzymes: No results for input(s): "CKTOTAL", "CKMB", "CKMBINDEX", "TROPONINI" in the last 168 hours. CBG: Recent Labs  Lab  09/18/22 0833  GLUCAP 100*   Iron Studies: No results for input(s): "IRON", "TIBC", "TRANSFERRIN", "FERRITIN" in the last 72 hours. '@lablastinr3'$ @ Studies/Results: DG Abd Portable 1V  Result Date: 09/18/2022 CLINICAL DATA:  Orogastric tube placement EXAM: PORTABLE ABDOMEN - 1 VIEW COMPARISON:  Earlier films from the same day FINDINGS: Gastric tube has been advanced into the decompressed stomach. Small bowel and colon are nondilated. Embolization coils project in the mid abdomen. IMPRESSION: Gastric tube advanced into the stomach. Electronically Signed   By: Lucrezia Europe M.D.   On: 09/18/2022 09:07   DG Chest Port 1 View  Result Date: 09/18/2022 CLINICAL DATA:  Endotracheal tube placement EXAM: PORTABLE CHEST 1 VIEW COMPARISON:  09/18/2022 FINDINGS: Endotracheal tube tip is at the level of the clavicular heads. The side port and tip of the nasogastric tube are below the field of view.  Unchanged appearance of right middle lobe consolidation IMPRESSION: 1. Endotracheal tube tip is at the level of the clavicular heads. 2. Unchanged right middle lobe consolidation. Electronically Signed   By: Ulyses Jarred M.D.   On: 09/18/2022 03:32   DG Chest Port 1 View  Result Date: 09/18/2022 CLINICAL DATA:  Shortness of breath, respiratory distress EXAM: PORTABLE CHEST 1 VIEW COMPARISON:  08/14/2022 FINDINGS: Cardiomegaly, vascular congestion. Right lower lobe airspace opacity/consolidation. Left mid lung atelectasis. No effusions or acute bony abnormality. IMPRESSION: No megaly, vascular congestion. Right lower lobe opacity concerning for pneumonia. Electronically Signed   By: Rolm Baptise M.D.   On: 09/18/2022 00:21   Medications:  [START ON 09/19/2022] azithromycin     cefTRIAXone (ROCEPHIN)  IV     fentaNYL infusion INTRAVENOUS 200 mcg/hr (09/18/22 0800)   nitroGLYCERIN Stopped (09/18/22 0254)   propofol (DIPRIVAN) infusion 50 mcg/kg/min (09/18/22 0800)    Chlorhexidine Gluconate Cloth  6 each Topical Q0600    docusate  100 mg Per Tube BID   famotidine  20 mg Per Tube BID   heparin injection (subcutaneous)  5,000 Units Subcutaneous Q8H   mouth rinse  15 mL Mouth Rinse Q2H   polyethylene glycol  17 g Per Tube Daily     OP HD: [place] T[place]  3h 1mn   450/1.5   70.5kg  2/2 bath  LUA AVF  Hep none - last HD 12/02, post wt 78kg - has been coming off 5-9 kg over the last 2 wks - doxercalciferol 4 ug IV tts - mircera 225 ug IV q2, last 11/30, due 12/13    CXR 12/04 - IMPRESSION: Cardiomegaly, vascular congestion. Right lower lobe opacity concerning for pneumonia.   Assessment/ Plan: AHRF/ vol overload/ pulm edema ? +RSV-  HD today with aggressive UF, plan another treatment tomorrow. HTN - poorly controlled, follow with UF, home meds Volume - coming off 4- 8 kg over dry wt for the past 2 wks. per RN declined extra HD offered outpt ESRD - on HD TTS. Last HD was Saturday. HD now and resume outpt schedule tomorrow.  Atrial fib H/o colon cancer H/o GI bleed Anemia esrd - Hb 10.4 > 12.9 > 11.6. No esa needs.  MBD ckd - corr ca ok, phos 6s - cont binders and VDRA.   LJannifer HickMD 09/18/2022, 9:11 AM  [facility] Pager: [phone]

## 2022-09-18 NOTE — Progress Notes (Signed)
RT and RN transported vent patient to [facility] ICU. Vital signs stable through out.

## 2022-09-18 NOTE — Progress Notes (Signed)
  Echocardiogram 2D Echocardiogram has been performed.  Timothy [name] 09/18/2022, 3:24 PM

## 2022-09-19 DIAGNOSIS — N186 End stage renal disease: Secondary | ICD-10-CM | POA: Diagnosis not present

## 2022-09-19 DIAGNOSIS — J9601 Acute respiratory failure with hypoxia: Secondary | ICD-10-CM | POA: Diagnosis not present

## 2022-09-19 DIAGNOSIS — R4182 Altered mental status, unspecified: Secondary | ICD-10-CM | POA: Diagnosis not present

## 2022-09-19 DIAGNOSIS — J189 Pneumonia, unspecified organism: Secondary | ICD-10-CM | POA: Diagnosis not present

## 2022-09-19 LAB — CBC
HCT: 33.5 % — ABNORMAL LOW (ref 39.0–52.0)
Hemoglobin: 10.8 g/dL — ABNORMAL LOW (ref 13.0–17.0)
MCH: 27.8 pg (ref 26.0–34.0)
MCHC: 32.2 g/dL (ref 30.0–36.0)
MCV: 86.3 fL (ref 80.0–100.0)
Platelets: 271 10*3/uL (ref 150–400)
RBC: 3.88 MIL/uL — ABNORMAL LOW (ref 4.22–5.81)
RDW: 17.4 % — ABNORMAL HIGH (ref 11.5–15.5)
WBC: 7 10*3/uL (ref 4.0–10.5)
nRBC: 0 % (ref 0.0–0.2)

## 2022-09-19 LAB — COMPREHENSIVE METABOLIC PANEL
ALT: 13 U/L (ref 0–44)
AST: 18 U/L (ref 15–41)
Albumin: 2.1 g/dL — ABNORMAL LOW (ref 3.5–5.0)
Alkaline Phosphatase: 112 U/L (ref 38–126)
Anion gap: 13 (ref 5–15)
BUN: 57 mg/dL — ABNORMAL HIGH (ref 8–23)
CO2: 25 mmol/L (ref 22–32)
Calcium: 8.1 mg/dL — ABNORMAL LOW (ref 8.9–10.3)
Chloride: 96 mmol/L — ABNORMAL LOW (ref 98–111)
Creatinine, Ser: 6.33 mg/dL — ABNORMAL HIGH (ref 0.61–1.24)
GFR, Estimated: 9 mL/min — ABNORMAL LOW (ref >60)
Glucose, Bld: 77 mg/dL (ref 70–99)
Potassium: 4.1 mmol/L (ref 3.5–5.1)
Sodium: 134 mmol/L — ABNORMAL LOW (ref 135–145)
Total Bilirubin: 0.7 mg/dL (ref 0.3–1.2)
Total Protein: 5.8 g/dL — ABNORMAL LOW (ref 6.5–8.1)

## 2022-09-19 LAB — GLUCOSE, CAPILLARY
Glucose-Capillary: 80 mg/dL (ref 70–99)
Glucose-Capillary: 69 mg/dL — ABNORMAL LOW (ref 70–99)
Glucose-Capillary: 73 mg/dL (ref 70–99)
Glucose-Capillary: 73 mg/dL (ref 70–99)
Glucose-Capillary: 141 mg/dL — ABNORMAL HIGH (ref 70–99)

## 2022-09-19 LAB — TRIGLYCERIDES: Triglycerides: 125 mg/dL (ref <150)

## 2022-09-19 LAB — HEPATITIS B SURFACE ANTIBODY, QUANTITATIVE: Hep B S AB Quant (Post): 29.6 m[IU]/mL (ref >9.9)

## 2022-09-19 MED ORDER — HYDROXYZINE HCL 10 MG/5ML PO SYRP: 10.0000 mg | ORAL_SOLUTION | Freq: Three times a day (TID) | ORAL | Status: DC | PRN

## 2022-09-19 MED ORDER — CARVEDILOL 6.25 MG PO TABS
6.2500 mg | ORAL_TABLET | Freq: Two times a day (BID) | ORAL | Status: DC
  Administered 2022-09-19 – 2022-09-22 (×5): 6.25 mg via ORAL
  Filled 2022-09-19 (×5): qty 1

## 2022-09-19 MED ORDER — ORAL CARE MOUTH RINSE: 15.0000 mL | OROMUCOSAL | Status: DC | PRN

## 2022-09-19 MED ORDER — DEXTROSE 50 % IV SOLN
INTRAVENOUS | Status: AC
  Administered 2022-09-19: 50 mL
  Filled 2022-09-19: qty 50

## 2022-09-19 MED ORDER — HYDROXYZINE HCL 10 MG PO TABS
10.0000 mg | ORAL_TABLET | Freq: Three times a day (TID) | ORAL | Status: DC | PRN
  Administered 2022-09-19 – 2022-09-21 (×2): 10 mg via ORAL
  Filled 2022-09-19 (×4): qty 1

## 2022-09-19 MED ORDER — OXYCODONE HCL 5 MG PO TABS
5.0000 mg | ORAL_TABLET | Freq: Three times a day (TID) | ORAL | Status: DC | PRN
  Administered 2022-09-19 – 2022-09-22 (×7): 5 mg via ORAL
  Filled 2022-09-19 (×7): qty 1

## 2022-09-19 NOTE — Progress Notes (Signed)
RT called to room by Dr. Tacy Learn, pt had self extubated. Pt placed on 7L [place] sating 96%. RT will continue to monitor pt.

## 2022-09-19 NOTE — Progress Notes (Signed)
[facility] Progress Note   Subjective:   remains intubated in ICU.  Wife bedside this AM. UF 4L yesterday.    Objective Vitals:   09/19/22 0745 09/19/22 0800 09/19/22 0900 09/19/22 1000  BP: 120/78 (!) 128/92 (!) 128/90 (!) 131/95  Pulse: 62 62 80 75  Resp: (!) 24 (!) 24 12 (!) 24  Temp: 97.9 F (36.6 C)     TempSrc: Axillary     SpO2: 98% 96% 93% 98%  Weight:      Height:       Physical Exam General: intubated and sedated Heart: RRR Lungs: coarse BL on vent Abdomen:soft Extremities: 1+ edema Dialysis Access:  LUE AVF + t/b  Additional Objective Labs: Basic Metabolic Panel: Recent Labs  Lab 09/18/22 0002 09/18/22 0011 09/18/22 0413 09/18/22 0424 09/18/22 0625 09/19/22 0600  NA 131*   < > 130* 130*  --  134*  K 4.5   < > 4.2 4.2  --  4.1  CL 91*  --   --  88*  --  96*  CO2 23  --   --  25  --  25  GLUCOSE 125*  --   --  127*  --  77  BUN 78*  --   --  80*  --  57*  CREATININE 7.81*  --   --  7.87*  --  6.33*  CALCIUM 8.2*  --   --  8.4*  --  8.1*  PHOS  --   --   --  6.4* 6.0*  --    < > = values in this interval not displayed.    Liver Function Tests: Recent Labs  Lab 09/18/22 0002 09/18/22 0424 09/19/22 0600  AST '24 27 18  '$ ALT '18 18 13  '$ ALKPHOS 150* 169* 112  BILITOT 0.8 0.8 0.7  PROT 7.6 8.3* 5.8*  ALBUMIN 2.9* 3.1* 2.1*    No results for input(s): "LIPASE", "AMYLASE" in the last 168 hours. CBC: Recent Labs  Lab 09/18/22 0002 09/18/22 0011 09/18/22 0413 09/18/22 0424 09/19/22 0600  WBC 12.1*  --   --  12.9* 7.0  NEUTROABS  --   --   --  11.3*  --   HGB 10.4*   < > 12.9* 11.6* 10.8*  HCT 32.0*   < > 38.0* 34.8* 33.5*  MCV 89.1  --   --  89.0 86.3  PLT 294  --   --  312 271   < > = values in this interval not displayed.    Blood Culture    Component Value Date/Time   SDES BLOOD BLOOD RIGHT HAND 09/18/2022 0625   SDES BLOOD BLOOD RIGHT HAND 09/18/2022 0625   SPECREQUEST  09/18/2022 0625    BOTTLES DRAWN AEROBIC  AND ANAEROBIC Blood Culture adequate volume   SPECREQUEST  09/18/2022 0625    BOTTLES DRAWN AEROBIC AND ANAEROBIC Blood Culture adequate volume   CULT  09/18/2022 0625    NO GROWTH 1 DAY Performed at [facility], [place] [address], [place], [place] [address]    CULT  09/18/2022 [phone]    NO GROWTH 1 DAY Performed at [facility] [address] [place], [place] [address]    REPTSTATUS PENDING 09/18/2022 0623   REPTSTATUS PENDING 09/18/2022 7628    Cardiac Enzymes: No results for input(s): "CKTOTAL", "CKMB", "CKMBINDEX", "TROPONINI" in the last 168 hours. CBG: Recent Labs  Lab 09/18/22 1531 09/18/22 2022 09/18/22 2334 09/19/22 0334 09/19/22 3151  GLUCAP 93 89 87 80 69*    Iron Studies: No results for input(s): "IRON", "TIBC", "TRANSFERRIN", "FERRITIN" in the last 72 hours. '@lablastinr3'$ @ Studies/Results: ECHOCARDIOGRAM COMPLETE  Result Date: 09/18/2022    ECHOCARDIOGRAM REPORT   Patient Name:   Timothy [name] Date of Exam: 09/18/2022 Medical Rec #:  [id number]       Height:       73.0 in Accession #:    [id number]      Weight:       155.6 lb Date of Birth:  1961-06-05       BSA:          1.934 m Patient Age:    61 years        BP:           133/96 mmHg Patient Gender: M               HR:           66 bpm. Exam Location:  Inpatient Procedure: 2D Echo, Color Doppler and Cardiac Doppler Indications:    Elevated troponin  History:        Patient has prior history of Echocardiogram examinations, most                 recent 02/14/2022. CHF; Risk Factors:Dyslipidemia. DOE. CKD.                 Polysusbstance abuse.  Sonographer:    Clayton Lefort RDCS (AE) Referring Phys: [id number] Kayleen Memos  Sonographer Comments: Echo performed with patient supine and on artificial respirator. IMPRESSIONS  1. Left ventricular ejection fraction, by estimation, is 40 to 45%. The left ventricle has mildly decreased function. The left ventricular internal cavity size was moderately dilated. There is severe  concentric left ventricular hypertrophy.  2. Right ventricular systolic function is moderately reduced. The right ventricular size is mildly enlarged. Mildly increased right ventricular wall thickness.  3. Left atrial size was mildly dilated.  4. Right atrial size was mildly dilated.  5. Mild mitral valve regurgitation.  6. AV is thickened, calcified Appears functionally bicuspid. Peak and mean gradients through the valve are 7 and 4 mm Hg respectively. AVA (VTI) is 3.22 cm2. DVI is 0.85 consistent with mild AS.Marland Kitchen The aortic valve is bicuspid. Aortic valve regurgitation is mild.  7. Aortic dilatation noted. There is moderate dilatation of the ascending aorta, measuring 43 mm.  8. The inferior vena cava is normal in size with greater than 50% respiratory variability, suggesting right atrial pressure of 3 mmHg. FINDINGS  Left Ventricle: Left ventricular ejection fraction, by estimation, is 40 to 45%. The left ventricle has mildly decreased function. The left ventricular internal cavity size was moderately dilated. There is severe concentric left ventricular hypertrophy. Right Ventricle: The right ventricular size is mildly enlarged. Mildly increased right ventricular wall thickness. Right ventricular systolic function is moderately reduced. Left Atrium: Left atrial size was mildly dilated. Right Atrium: Right atrial size was mildly dilated. Pericardium: There is no evidence of pericardial effusion. Mitral Valve: There is mild thickening of the mitral valve leaflet(s). Mild to moderate mitral annular calcification. Mild mitral valve regurgitation. Tricuspid Valve: The tricuspid valve is normal in structure. Tricuspid valve regurgitation is mild. Aortic Valve: AV is thickened, calcified Appears functionally bicuspid. Peak and mean gradients through the valve are 7 and 4 mm Hg respectively. AVA (VTI) is 3.22 cm2. DVI is 0.85 consistent with mild AS. The aortic valve is bicuspid. Aortic valve regurgitation is mild.  Aortic  valve mean gradient measures 4.0 mmHg. Aortic valve peak gradient measures 7.2 mmHg. Aortic valve area, by VTI measures 3.22 cm. Pulmonic Valve: The pulmonic valve was not well visualized. Pulmonic valve regurgitation is not visualized. No evidence of pulmonic stenosis. Aorta: Aortic dilatation noted. There is moderate dilatation of the ascending aorta, measuring 43 mm. Venous: The inferior vena cava is normal in size with greater than 50% respiratory variability, suggesting right atrial pressure of 3 mmHg. IAS/Shunts: No atrial level shunt detected by color flow Doppler.  LEFT VENTRICLE PLAX 2D LVIDd:         5.30 cm      Diastology LVIDs:         4.20 cm      LV e' medial:    6.42 cm/s LV PW:         1.60 cm      LV E/e' medial:  12.0 LV IVS:        1.80 cm      LV e' lateral:   9.03 cm/s LVOT diam:     2.20 cm      LV E/e' lateral: 8.5 LV SV:         82 LV SV Index:   43 LVOT Area:     3.80 cm  LV Volumes (MOD) LV vol d, MOD A2C: 172.0 ml LV vol d, MOD A4C: 189.0 ml LV vol s, MOD A2C: 98.2 ml LV vol s, MOD A4C: 88.2 ml LV SV MOD A2C:     73.8 ml LV SV MOD A4C:     189.0 ml LV SV MOD BP:      85.4 ml RIGHT VENTRICLE RV S prime:     9.90 cm/s TAPSE (M-mode): 1.6 cm LEFT ATRIUM             Index        RIGHT ATRIUM           Index LA diam:        4.90 cm 2.53 cm/m   RA Area:     20.10 cm LA Vol (A2C):   62.1 ml 32.11 ml/m  RA Volume:   55.50 ml  28.69 ml/m LA Vol (A4C):   53.5 ml 27.66 ml/m LA Biplane Vol: 69.0 ml 35.67 ml/m  AORTIC VALVE AV Area (Vmax):    3.38 cm AV Area (Vmean):   3.07 cm AV Area (VTI):     3.22 cm AV Vmax:           134.00 cm/s AV Vmean:          92.200 cm/s AV VTI:            0.256 m AV Peak Grad:      7.2 mmHg AV Mean Grad:      4.0 mmHg LVOT Vmax:         119.00 cm/s LVOT Vmean:        74.500 cm/s LVOT VTI:          0.217 m LVOT/AV VTI ratio: 0.85  AORTA Ao Root diam: 3.10 cm Ao Asc diam:  4.30 cm MITRAL VALVE               TRICUSPID VALVE MV Area (PHT): 4.19 cm    TR Peak grad:    23.0 mmHg MV Decel Time: 181 msec    TR Vmax:        240.00 cm/s MV E velocity: 76.80 cm/s MV A velocity: 54.80 cm/s  SHUNTS MV E/A ratio:  1.40        Systemic VTI:  0.22 m                            Systemic Diam: 2.20 cm Dorris Carnes MD Electronically signed by Dorris Carnes MD Signature Date/Time: 09/18/2022/7:22:07 PM    Final    DG Abd Portable 1V  Result Date: 09/18/2022 CLINICAL DATA:  Orogastric tube placement EXAM: PORTABLE ABDOMEN - 1 VIEW COMPARISON:  Earlier films from the same day FINDINGS: Gastric tube has been advanced into the decompressed stomach. Small bowel and colon are nondilated. Embolization coils project in the mid abdomen. IMPRESSION: Gastric tube advanced into the stomach. Electronically Signed   By: Lucrezia Europe M.D.   On: 09/18/2022 09:07   DG Chest Port 1 View  Result Date: 09/18/2022 CLINICAL DATA:  Endotracheal tube placement EXAM: PORTABLE CHEST 1 VIEW COMPARISON:  09/18/2022 FINDINGS: Endotracheal tube tip is at the level of the clavicular heads. The side port and tip of the nasogastric tube are below the field of view. Unchanged appearance of right middle lobe consolidation IMPRESSION: 1. Endotracheal tube tip is at the level of the clavicular heads. 2. Unchanged right middle lobe consolidation. Electronically Signed   By: Ulyses Jarred M.D.   On: 09/18/2022 03:32   DG Chest Port 1 View  Result Date: 09/18/2022 CLINICAL DATA:  Shortness of breath, respiratory distress EXAM: PORTABLE CHEST 1 VIEW COMPARISON:  08/14/2022 FINDINGS: Cardiomegaly, vascular congestion. Right lower lobe airspace opacity/consolidation. Left mid lung atelectasis. No effusions or acute bony abnormality. IMPRESSION: No megaly, vascular congestion. Right lower lobe opacity concerning for pneumonia. Electronically Signed   By: Rolm Baptise M.D.   On: 09/18/2022 00:21   Medications:  azithromycin 250 mL/hr at 09/19/22 0800   cefTRIAXone (ROCEPHIN)  IV Stopped (09/18/22 2305)   fentaNYL infusion  INTRAVENOUS Stopped (09/19/22 0900)   propofol (DIPRIVAN) infusion Stopped (09/19/22 0830)    Chlorhexidine Gluconate Cloth  6 each Topical Q0600   docusate  100 mg Per Tube BID   famotidine  20 mg Per Tube BID   heparin injection (subcutaneous)  5,000 Units Subcutaneous Q8H   mouth rinse  15 mL Mouth Rinse Q2H   polyethylene glycol  17 g Per Tube Daily     [facility] HD: [facility]  3h 43mn   450/1.5   70.5kg  2/2 bath  LUA AVF  Hep none - last HD 12/02, post wt 78kg - has been coming off 5-9 kg over the last 2 wks - doxercalciferol 4 ug IV tts - mircera 225 ug IV q2, last 11/30, due 12/13    CXR 12/04 - IMPRESSION: Cardiomegaly, vascular congestion. Right lower lobe opacity concerning for pneumonia.   Assessment/ Plan: AHRF/ vol overload/ pulm edema ? +RSV-  HD yesterday with aggressive UF, plan another treatment today if able based on census. HTN - normotensive now Volume - coming off 4- 8 kg over dry wt for the past 2 wks. per RN declined extra HD offered outpt -- dial volume down here.  ESRD - on HD TTS. Last HD was Saturday. HD Mon for volume and resume outpt schedule today as able.  Atrial fib H/o colon cancer H/o GI bleed Anemia esrd - Hb 10.4 > 12.9 > 11.6. No esa needs.  MBD ckd - corr ca ok, phos 6s - cont binders and VDRA.   LJannifer HickMD 09/19/2022, 10:45 AM  [facility] Pager: [phone]

## 2022-09-19 NOTE — Progress Notes (Signed)
NAME:  Timothy [name], MRN:  [id number], DOB:  11/08/1960, LOS: 1 ADMISSION DATE:  09/17/2022, CONSULTATION DATE:  12/4 REFERRING MD:  Dr. Nevada Crane, CHIEF COMPLAINT:  Acute respiratory failure    History of Present Illness:  Patient is a 61 year old male with pertinent PMH of ESRD on HD TTS, HTN, polysubstance abuse admitted to [facility] ED on 12/4 with SOB.  Patient recently admitted to [facility] on 10/28 and discharged 10/30 for GI bleed.  On 12/4 patient was developing respiratory distress and called EMS.  States he is having chest pain/tightness.  States he has been using too much "ice".  EMS placed on NRB gave 0.4 nitro.  Patient states he only received half session of dialysis a "few days ago".  Transferred to [facility] ED.  Upon arrival to [facility] ED on 12/4, patient with increased WOB.  Afebrile and hypertensive 179/143.  Was placed on BiPAP.  Bilateral JVD and peripheral edema appreciated.  Patient also lethargic but arousable to voice.  EKG with no ST elevation.  Troponin 126 and BNP 4500.  WBC 12.1.  Nephro consulted for dialysis.  VBG WNL.  CXR concerning for RLL pneumonia given Rocephin/azithromycin.  Patient not tolerating BiPAP and requiring to be intubated.  PCCM consulted for ICU admission.  Pertinent  Medical History   Past Medical History:  Diagnosis Date   Acute metabolic encephalopathy 69/67/8938   Anemia of chronic kidney failure    BPH (benign prostatic hyperplasia)    Colon cancer ([place]) 2014   spouse states he had surgury for colon CA   End stage renal disease on dialysis ([place]) 2017   TTHSat   Hepatitis C    treated   Hypertension    Hypertensive crisis 04/07/2022   Hypertensive heart disease with chronic diastolic congestive heart failure ([place]) 07/17/2016   Polysubstance abuse ([place])    History of heroin and marijuana use   Thoracic aortic aneurysm ([place])    followed by Dr. Ellyn Hack     [facility] Events: Including procedures, antibiotic start and stop dates in addition to  other pertinent events   12/4 admitted to [facility] ED with SOB; intubated; PCCM consulted  Interim History / Subjective:  Patient received urgent hemodialysis yesterday, tolerated well Respiratory pathogen panel came back positive for RSV, kept on droplet precautions This morning he failed spontaneous breathing trial due to low tidal volume  Objective   Blood pressure 130/87, pulse 71, temperature 98.6 F (37 C), temperature source Axillary, resp. rate (!) 21, height '6\' 1"'$  (1.854 m), weight 55 kg, SpO2 97 %.    Vent Mode: PRVC FiO2 (%):  [40 %] 40 % Set Rate:  [24 bmp] 24 bmp Vt Set:  [630 mL] 630 mL PEEP:  [5 cmH20] 5 cmH20 Plateau Pressure:  [28 cmH20-33 cmH20] 30 cmH20   Intake/Output Summary (Last 24 hours) at 09/19/2022 1230 Last data filed at 09/19/2022 1000 Gross per 24 hour  Intake 891.15 ml  Output --  Net 891.15 ml   Filed Weights   09/18/22 0730 09/18/22 1158 09/19/22 0500  Weight: 70.6 kg 66.9 kg 55 kg    Examination:   Physical exam: General: Crtitically ill-appearing male, orally intubated HEENT: [place]/AT, eyes anicteric.  ETT and OGT in place Neuro: Opens eyes with vocal stimuli, following simple commands, moving all 4 extremities Chest: Mechanical breath sounds bilaterally, no wheezes Heart: Regular rate and rhythm, no murmurs or gallops Abdomen: Soft, nontender, nondistended, bowel sounds present Skin: No rash Extremities: BLE edema; LUE fistula with no  erythema  Na 134 BUN 57 Serum creatinine 6.33 Serum phosphorus 6 Serum troponin 124 WBC 7 H/H 10.8/33.5 PLT 271  Resolved Hospital Problem list     Assessment & Plan:  Acute respiratory failure with hypoxia RLL pneumonia community-acquired pneumonia RSV pneumonia FiO2 and PEEP was titrated down, currently at 40% and 5 of PEEP Continue lung protective ventilation VAP prevention bundle in place Currently off sedation Failed spontaneous breathing trial this morning due to low tidal volume Continue IV  antibiotic with ceftriaxone and azithromycin Respiratory pathogen panel came back positive for RSV Continue droplet precautions  ESRD on HD Acute uremic encephalopathy versus metabolic encephalopathy related to hypoxemia AGMA Flash pulmonary edema Hyperphosphatemia Received urgent hemodialysis yesterday Plan to have dialysis again today to resume his schedule of Tuesday Thursday and Saturday Stop sedation completely Monitor electrolytes Serum phosphorus 6 Mental status is improving  Demand cardiac ischemia Troponin remained flat around 120s EKG showed no ST or T wave changes  Uncontrolled HTN After dialysis blood pressure is better controlled Resume Coreg Hold diltiazem Continue as needed hydralazine  Hypovolemic hyponatremia: Serum sodium is improving, currently at 134 Trend electrolytes  Chronic anxiety/depression Hold home cymbalta  Best Practice (right click and "Reselect all SmartList Selections" daily)   Diet/type: NPO w/ meds via tube DVT prophylaxis: prophylactic heparin  GI prophylaxis: H2B Lines: N/A Foley:  N/A Code Status:  full code Last date of multidisciplinary goals of care discussion [12/4 attempted to call spouse Angela Nevin but no answer]  Labs   CBC: Recent Labs  Lab 09/18/22 0002 09/18/22 0011 09/18/22 0413 09/18/22 0424 09/19/22 0600  WBC 12.1*  --   --  12.9* 7.0  NEUTROABS  --   --   --  11.3*  --   HGB 10.4* 11.9* 12.9* 11.6* 10.8*  HCT 32.0* 35.0* 38.0* 34.8* 33.5*  MCV 89.1  --   --  89.0 86.3  PLT 294  --   --  312 151    Basic Metabolic Panel: Recent Labs  Lab 09/18/22 0002 09/18/22 0011 09/18/22 0413 09/18/22 0424 09/18/22 0625 09/19/22 0600  NA 131* 131* 130* 130*  --  134*  K 4.5 4.3 4.2 4.2  --  4.1  CL 91*  --   --  88*  --  96*  CO2 23  --   --  25  --  25  GLUCOSE 125*  --   --  127*  --  77  BUN 78*  --   --  80*  --  57*  CREATININE 7.81*  --   --  7.87*  --  6.33*  CALCIUM 8.2*  --   --  8.4*  --  8.1*  MG   --   --   --  2.3  --   --   PHOS  --   --   --  6.4* 6.0*  --    GFR: Estimated Creatinine Clearance: 9.5 mL/min (A) (by C-G formula based on SCr of 6.33 mg/dL (H)). Recent Labs  Lab 09/18/22 0002 09/18/22 0424 09/19/22 0600  PROCALCITON  --  0.66  --   WBC 12.1* 12.9* 7.0  LATICACIDVEN 0.9 1.0  --     Liver Function Tests: Recent Labs  Lab 09/18/22 0002 09/18/22 0424 09/19/22 0600  AST '24 27 18  '$ ALT '18 18 13  '$ ALKPHOS 150* 169* 112  BILITOT 0.8 0.8 0.7  PROT 7.6 8.3* 5.8*  ALBUMIN 2.9* 3.1* 2.1*   No results for input(s): "  LIPASE", "AMYLASE" in the last 168 hours. Recent Labs  Lab 09/18/22 0625  AMMONIA 23    ABG    Component Value Date/Time   PHART 7.338 (L) 09/18/2022 0413   PCO2ART 50.3 (H) 09/18/2022 0413   PO2ART 86 09/18/2022 0413   HCO3 27.0 09/18/2022 0413   TCO2 29 09/18/2022 0413   ACIDBASEDEF 4.0 (H) 09/09/2016 0931   O2SAT 96 09/18/2022 0413     Coagulation Profile: Recent Labs  Lab 09/18/22 0002  INR 1.3*    Cardiac Enzymes: No results for input(s): "CKTOTAL", "CKMB", "CKMBINDEX", "TROPONINI" in the last 168 hours.  HbA1C: No results found for: "HGBA1C"  CBG: Recent Labs  Lab 09/18/22 2022 09/18/22 2334 09/19/22 0334 09/19/22 0822 09/19/22 1213  GLUCAP 89 87 80 69* 73    This patient is critically ill with multiple organ system failure which requires frequent high complexity decision making, assessment, support, evaluation, and titration of therapies. This was completed through the application of advanced monitoring technologies and extensive interpretation of multiple databases.  During this encounter critical care time was devoted to patient care services described in this note for 38 minutes.    Jacky Kindle, MD [facility] See Amion for pager If no response to pager, please call [phone] until 7pm After 7pm, Please call E-link [phone]

## 2022-09-19 NOTE — Progress Notes (Signed)
   09/19/22 1830  Vitals  Temp 97.8 F (36.6 C)  Temp Source Oral  BP (!) 152/141  MAP (mmHg) 146  BP Location Right Arm  BP Method Automatic  Patient Position (if appropriate) Lying  Pulse Rate 92  Pulse Rate Source Monitor  ECG Heart Rate 88  Resp 16  Oxygen Therapy  SpO2 99 %  O2 Device Nasal Cannula  O2 Flow Rate (L/min) 7 L/min  Post Treatment  Dialyzer Clearance Clear  Duration of HD Treatment -hour(s) 3.5 hour(s)  Hemodialysis Intake (mL) 0 mL  Liters Processed 78.3  Fluid Removed (mL) 4000 mL  Tolerated HD Treatment Yes  Post-Hemodialysis Comments goal met tolerated well  AVG/AVF Arterial Site Held (minutes) 5 minutes  AVG/AVF Venous Site Held (minutes) 5 minutes  Fistula / Graft Left Upper arm Arteriovenous fistula  Placement Date/Time: 05/26/22 1008   Orientation: Left  Access Location: Upper arm  Access Type: (c) Arteriovenous fistula  Site Condition No complications  Fistula / Graft Assessment Present;Thrill;Bruit  Status Deaccessed  Drainage Description None    

## 2022-09-20 DIAGNOSIS — J9601 Acute respiratory failure with hypoxia: Secondary | ICD-10-CM | POA: Diagnosis not present

## 2022-09-20 DIAGNOSIS — N186 End stage renal disease: Secondary | ICD-10-CM | POA: Diagnosis not present

## 2022-09-20 DIAGNOSIS — J189 Pneumonia, unspecified organism: Secondary | ICD-10-CM | POA: Diagnosis not present

## 2022-09-20 DIAGNOSIS — E8770 Fluid overload, unspecified: Secondary | ICD-10-CM | POA: Diagnosis not present

## 2022-09-20 DIAGNOSIS — J121 Respiratory syncytial virus pneumonia: Secondary | ICD-10-CM

## 2022-09-20 MED ORDER — HYDRALAZINE HCL 25 MG PO TABS
25.0000 mg | ORAL_TABLET | Freq: Three times a day (TID) | ORAL | Status: DC
  Administered 2022-09-20 – 2022-09-22 (×7): 25 mg via ORAL
  Filled 2022-09-20 (×8): qty 1

## 2022-09-20 MED ORDER — BACITRACIN-NEOMYCIN-POLYMYXIN OINTMENT TUBE
TOPICAL_OINTMENT | CUTANEOUS | Status: DC | PRN
  Filled 2022-09-20: qty 14

## 2022-09-20 MED ORDER — GUAIFENESIN-DM 100-10 MG/5ML PO SYRP
10.0000 mL | ORAL_SOLUTION | ORAL | Status: DC | PRN
  Administered 2022-09-20 – 2022-09-22 (×4): 10 mL via ORAL
  Filled 2022-09-20 (×4): qty 10

## 2022-09-20 MED ORDER — SEVELAMER CARBONATE 800 MG PO TABS
1600.0000 mg | ORAL_TABLET | Freq: Three times a day (TID) | ORAL | Status: DC
  Administered 2022-09-20 – 2022-09-22 (×5): 1600 mg via ORAL
  Filled 2022-09-20 (×5): qty 2

## 2022-09-20 MED ORDER — DULOXETINE HCL 20 MG PO CPEP
20.0000 mg | ORAL_CAPSULE | Freq: Every day | ORAL | Status: DC
  Administered 2022-09-20 – 2022-09-22 (×3): 20 mg via ORAL
  Filled 2022-09-20 (×4): qty 1

## 2022-09-20 NOTE — Progress Notes (Signed)
  Patient self extubated around 1:30 PM, reassess the patient he was doing well on nasal cannula oxygen with O2 sat 100%, receiving hemodialysis.  Decision was to closely observe him on nasal cannula oxygen     Jacky Kindle, MD

## 2022-09-20 NOTE — Progress Notes (Signed)
Pt continues to refuse CBG checks.  Refusing to leave cardiac monitor on while going to bathroom.  Otherwise, pt compliant w/ plan of care.

## 2022-09-20 NOTE — Progress Notes (Signed)
[facility] Progress Note Patient Name: Timothy [name] DOB: 03/31/1961 MRN: [id number]   Date of Service  09/20/2022  HPI/Events of Note  Patient request for cough medication.  eICU Interventions  Plan: Robitussin DM 10 mL PO Q 4 hours PRN cough.      Intervention Category Major Interventions: Other:  Lysle Dingwall 09/20/2022, 8:13 PM

## 2022-09-20 NOTE — Progress Notes (Signed)
Pt receives out-pt HD at [facility] on TTS. Will assist as needed.   [name] Renal Navigator [phone]

## 2022-09-20 NOTE — Progress Notes (Signed)
[facility] Progress Note   Subjective:   Self extubated yesterday and has remained on [place]. UF 4L yesterday.  RN tells me he is considering signing out AMA.  He denies complaints to me this AM  Objective Vitals:   09/20/22 0300 09/20/22 0400 09/20/22 0500 09/20/22 0600  BP: (!) 146/88 (!) 156/99 (!) 134/95 (!) 150/92  Pulse: 79 87 78 84  Resp: '14 20 14 18  '$ Temp:  98.3 F (36.8 C)    TempSrc:  Oral    SpO2: 100% 96% 96% 97%  Weight:   71.3 kg   Height:       Physical Exam General: awake alert comfortable Heart: RRR Lungs: coarse BL no rales Abdomen:soft Extremities: no edema Dialysis Access:  LUE AVF + t/b  Additional Objective Labs: Basic Metabolic Panel: Recent Labs  Lab 09/18/22 0002 09/18/22 0011 09/18/22 0413 09/18/22 0424 09/18/22 0625 09/19/22 0600  NA 131*   < > 130* 130*  --  134*  K 4.5   < > 4.2 4.2  --  4.1  CL 91*  --   --  88*  --  96*  CO2 23  --   --  25  --  25  GLUCOSE 125*  --   --  127*  --  77  BUN 78*  --   --  80*  --  57*  CREATININE 7.81*  --   --  7.87*  --  6.33*  CALCIUM 8.2*  --   --  8.4*  --  8.1*  PHOS  --   --   --  6.4* 6.0*  --    < > = values in this interval not displayed.    Liver Function Tests: Recent Labs  Lab 09/18/22 0002 09/18/22 0424 09/19/22 0600  AST '24 27 18  '$ ALT '18 18 13  '$ ALKPHOS 150* 169* 112  BILITOT 0.8 0.8 0.7  PROT 7.6 8.3* 5.8*  ALBUMIN 2.9* 3.1* 2.1*    No results for input(s): "LIPASE", "AMYLASE" in the last 168 hours. CBC: Recent Labs  Lab 09/18/22 0002 09/18/22 0011 09/18/22 0413 09/18/22 0424 09/19/22 0600  WBC 12.1*  --   --  12.9* 7.0  NEUTROABS  --   --   --  11.3*  --   HGB 10.4*   < > 12.9* 11.6* 10.8*  HCT 32.0*   < > 38.0* 34.8* 33.5*  MCV 89.1  --   --  89.0 86.3  PLT 294  --   --  312 271   < > = values in this interval not displayed.    Blood Culture    Component Value Date/Time   SDES BLOOD BLOOD RIGHT HAND 09/18/2022 0625   SDES BLOOD BLOOD RIGHT  HAND 09/18/2022 0625   SPECREQUEST  09/18/2022 0625    BOTTLES DRAWN AEROBIC AND ANAEROBIC Blood Culture adequate volume   SPECREQUEST  09/18/2022 0625    BOTTLES DRAWN AEROBIC AND ANAEROBIC Blood Culture adequate volume   CULT  09/18/2022 0625    NO GROWTH 2 DAYS Performed at [facility], [place] [address] [place], [place] [address]    CULT  09/18/2022 [phone]    NO GROWTH 2 DAYS Performed at [facility], [place] [address] [place],  [address]    REPTSTATUS PENDING 09/18/2022 7209   REPTSTATUS PENDING 09/18/2022 4709    Cardiac Enzymes: No results for input(s): "CKTOTAL", "CKMB", "CKMBINDEX", "TROPONINI" in the last 168 hours. CBG: Recent  Labs  Lab 09/19/22 0334 09/19/22 0822 09/19/22 1213 09/19/22 1646 09/19/22 2027  GLUCAP 80 69* 73 73 141*    Iron Studies: No results for input(s): "IRON", "TIBC", "TRANSFERRIN", "FERRITIN" in the last 72 hours. '@lablastinr3'$ @ Studies/Results: ECHOCARDIOGRAM COMPLETE  Result Date: 09/18/2022    ECHOCARDIOGRAM REPORT   Patient Name:   FLYNN GWYN Date of Exam: 09/18/2022 Medical Rec #:  [id number]       Height:       73.0 in Accession #:    [id number]      Weight:       155.6 lb Date of Birth:  27-Dec-1960       BSA:          1.934 m Patient Age:    61 years        BP:           133/96 mmHg Patient Gender: M               HR:           66 bpm. Exam Location:  Inpatient Procedure: 2D Echo, Color Doppler and Cardiac Doppler Indications:    Elevated troponin  History:        Patient has prior history of Echocardiogram examinations, most                 recent 02/14/2022. CHF; Risk Factors:Dyslipidemia. DOE. CKD.                 Polysusbstance abuse.  Sonographer:    Clayton Lefort RDCS (AE) Referring Phys: [id number] Kayleen Memos  Sonographer Comments: Echo performed with patient supine and on artificial respirator. IMPRESSIONS  1. Left ventricular ejection fraction, by estimation, is 40 to 45%. The left ventricle has mildly decreased function.  The left ventricular internal cavity size was moderately dilated. There is severe concentric left ventricular hypertrophy.  2. Right ventricular systolic function is moderately reduced. The right ventricular size is mildly enlarged. Mildly increased right ventricular wall thickness.  3. Left atrial size was mildly dilated.  4. Right atrial size was mildly dilated.  5. Mild mitral valve regurgitation.  6. AV is thickened, calcified Appears functionally bicuspid. Peak and mean gradients through the valve are 7 and 4 mm Hg respectively. AVA (VTI) is 3.22 cm2. DVI is 0.85 consistent with mild AS.Marland Kitchen The aortic valve is bicuspid. Aortic valve regurgitation is mild.  7. Aortic dilatation noted. There is moderate dilatation of the ascending aorta, measuring 43 mm.  8. The inferior vena cava is normal in size with greater than 50% respiratory variability, suggesting right atrial pressure of 3 mmHg. FINDINGS  Left Ventricle: Left ventricular ejection fraction, by estimation, is 40 to 45%. The left ventricle has mildly decreased function. The left ventricular internal cavity size was moderately dilated. There is severe concentric left ventricular hypertrophy. Right Ventricle: The right ventricular size is mildly enlarged. Mildly increased right ventricular wall thickness. Right ventricular systolic function is moderately reduced. Left Atrium: Left atrial size was mildly dilated. Right Atrium: Right atrial size was mildly dilated. Pericardium: There is no evidence of pericardial effusion. Mitral Valve: There is mild thickening of the mitral valve leaflet(s). Mild to moderate mitral annular calcification. Mild mitral valve regurgitation. Tricuspid Valve: The tricuspid valve is normal in structure. Tricuspid valve regurgitation is mild. Aortic Valve: AV is thickened, calcified Appears functionally bicuspid. Peak and mean gradients through the valve are 7 and 4 mm Hg respectively. AVA (VTI) is 3.22 cm2. DVI is 0.85  consistent with  mild AS. The aortic valve is bicuspid. Aortic valve regurgitation is mild. Aortic valve mean gradient measures 4.0 mmHg. Aortic valve peak gradient measures 7.2 mmHg. Aortic valve area, by VTI measures 3.22 cm. Pulmonic Valve: The pulmonic valve was not well visualized. Pulmonic valve regurgitation is not visualized. No evidence of pulmonic stenosis. Aorta: Aortic dilatation noted. There is moderate dilatation of the ascending aorta, measuring 43 mm. Venous: The inferior vena cava is normal in size with greater than 50% respiratory variability, suggesting right atrial pressure of 3 mmHg. IAS/Shunts: No atrial level shunt detected by color flow Doppler.  LEFT VENTRICLE PLAX 2D LVIDd:         5.30 cm      Diastology LVIDs:         4.20 cm      LV e' medial:    6.42 cm/s LV PW:         1.60 cm      LV E/e' medial:  12.0 LV IVS:        1.80 cm      LV e' lateral:   9.03 cm/s LVOT diam:     2.20 cm      LV E/e' lateral: 8.5 LV SV:         82 LV SV Index:   43 LVOT Area:     3.80 cm  LV Volumes (MOD) LV vol d, MOD A2C: 172.0 ml LV vol d, MOD A4C: 189.0 ml LV vol s, MOD A2C: 98.2 ml LV vol s, MOD A4C: 88.2 ml LV SV MOD A2C:     73.8 ml LV SV MOD A4C:     189.0 ml LV SV MOD BP:      85.4 ml RIGHT VENTRICLE RV S prime:     9.90 cm/s TAPSE (M-mode): 1.6 cm LEFT ATRIUM             Index        RIGHT ATRIUM           Index LA diam:        4.90 cm 2.53 cm/m   RA Area:     20.10 cm LA Vol (A2C):   62.1 ml 32.11 ml/m  RA Volume:   55.50 ml  28.69 ml/m LA Vol (A4C):   53.5 ml 27.66 ml/m LA Biplane Vol: 69.0 ml 35.67 ml/m  AORTIC VALVE AV Area (Vmax):    3.38 cm AV Area (Vmean):   3.07 cm AV Area (VTI):     3.22 cm AV Vmax:           134.00 cm/s AV Vmean:          92.200 cm/s AV VTI:            0.256 m AV Peak Grad:      7.2 mmHg AV Mean Grad:      4.0 mmHg LVOT Vmax:         119.00 cm/s LVOT Vmean:        74.500 cm/s LVOT VTI:          0.217 m LVOT/AV VTI ratio: 0.85  AORTA Ao Root diam: 3.10 cm Ao Asc diam:  4.30 cm  MITRAL VALVE               TRICUSPID VALVE MV Area (PHT): 4.19 cm    TR Peak grad:   23.0 mmHg MV Decel Time: 181 msec    TR Vmax:  240.00 cm/s MV E velocity: 76.80 cm/s MV A velocity: 54.80 cm/s  SHUNTS MV E/A ratio:  1.40        Systemic VTI:  0.22 m                            Systemic Diam: 2.20 cm Dorris Carnes MD Electronically signed by Dorris Carnes MD Signature Date/Time: 09/18/2022/7:22:07 PM    Final    DG Abd Portable 1V  Result Date: 09/18/2022 CLINICAL DATA:  Orogastric tube placement EXAM: PORTABLE ABDOMEN - 1 VIEW COMPARISON:  Earlier films from the same day FINDINGS: Gastric tube has been advanced into the decompressed stomach. Small bowel and colon are nondilated. Embolization coils project in the mid abdomen. IMPRESSION: Gastric tube advanced into the stomach. Electronically Signed   By: Lucrezia Europe M.D.   On: 09/18/2022 09:07   Medications:  cefTRIAXone (ROCEPHIN)  IV Stopped (09/19/22 2152)    carvedilol  6.25 mg Oral BID WC   Chlorhexidine Gluconate Cloth  6 each Topical Q0600   docusate  100 mg Per Tube BID   famotidine  20 mg Per Tube BID   heparin injection (subcutaneous)  5,000 Units Subcutaneous Q8H   hydrALAZINE  25 mg Oral Q8H   polyethylene glycol  17 g Per Tube Daily     [facility] HD: [place] [facility]  3h 27mn   450/1.5   70.5kg  2/2 bath  LUA AVF  Hep none - last HD 12/02, post wt 78kg - has been coming off 5-9 kg over the last 2 wks - doxercalciferol 4 ug IV tts - mircera 225 ug IV q2, last 11/30, due 12/13    CXR 12/04 - IMPRESSION: Cardiomegaly, vascular congestion. Right lower lobe opacity concerning for pneumonia.   Assessment/ Plan: AHRF/ vol overload/ pulm edema ? +RSV-  HD Mon and Tues for UF - net neg 6.1L for admission HTN - normotensive now Volume - coming off 4- 8 kg over dry wt for the past 2 wks. per RN declined extra HD offered outpt -- dialing volume down here and getting close to EDW which looks ok.  Should achieve EDW with tomorrows tx - says eats ice  incessantly and that's his problem.  ESRD - on HD TTS. Last HD was Saturday PTA - then HD Mon, Tues for volume. Next Thurs.  Atrial fib H/o colon cancer H/o GI bleed Anemia esrd - Hb 10s.  No esa needs.  MBD ckd - corr ca ok, phos 6s - cont binders and VDRA.   LJannifer HickMD 09/20/2022, 8:54 AM  [facility] Pager: [phone]

## 2022-09-20 NOTE — Progress Notes (Signed)
Pt refusing CBG checks. MD aware.

## 2022-09-20 NOTE — Progress Notes (Signed)
Pt non compliant with safety measures. Removing monitor and oxygen and getting up on his own. Educated on the importance of calling before getting up due to fall hazards. Floor mats are implemented and bed alarm is on.

## 2022-09-20 NOTE — Progress Notes (Signed)
NAME:  Timothy [name], MRN:  [id number], DOB:  1961-05-09, LOS: 2 ADMISSION DATE:  09/17/2022, CONSULTATION DATE:  12/4 REFERRING MD:  Dr. Nevada Crane, CHIEF COMPLAINT:  Acute respiratory failure    History of Present Illness:  Patient is a 61 year old male with pertinent PMH of ESRD on HD TTS, HTN, polysubstance abuse admitted to [facility] ED on 12/4 with SOB.  Patient recently admitted to [facility] on 10/28 and discharged 10/30 for GI bleed.  On 12/4 patient was developing respiratory distress and called EMS.  States he is having chest pain/tightness.  States he has been using too much "ice".  EMS placed on NRB gave 0.4 nitro.  Patient states he only received half session of dialysis a "few days ago".  Transferred to [facility] ED.  Upon arrival to [facility] ED on 12/4, patient with increased WOB.  Afebrile and hypertensive 179/143.  Was placed on BiPAP.  Bilateral JVD and peripheral edema appreciated.  Patient also lethargic but arousable to voice.  EKG with no ST elevation.  Troponin 126 and BNP 4500.  WBC 12.1.  Nephro consulted for dialysis.  VBG WNL.  CXR concerning for RLL pneumonia given Rocephin/azithromycin.  Patient not tolerating BiPAP and requiring to be intubated.  PCCM consulted for ICU admission.  Pertinent  Medical History   Past Medical History:  Diagnosis Date   Acute metabolic encephalopathy 62/70/3500   Anemia of chronic kidney failure    BPH (benign prostatic hyperplasia)    Colon cancer ([place]) 2014   spouse states he had surgury for colon CA   End stage renal disease on dialysis ([place]) 2017   TTHSat   Hepatitis C    treated   Hypertension    Hypertensive crisis 04/07/2022   Hypertensive heart disease with chronic diastolic congestive heart failure ([place]) 07/17/2016   Polysubstance abuse ([place])    History of heroin and marijuana use   Thoracic aortic aneurysm ([place])    followed by Dr. Ellyn Hack     [facility] Events: Including procedures, antibiotic start and stop dates in addition to  other pertinent events   12/4 admitted to [facility] ED with SOB; intubated; PCCM consulted  Interim History / Subjective:  Patient received hemodialysis per his schedule yesterday He self extubated in the afternoon yesterday, currently on 4 L nasal cannula oxygen  Remained afebrile  Objective   Blood pressure (!) 150/92, pulse 84, temperature 98.3 F (36.8 C), temperature source Oral, resp. rate 18, height '6\' 1"'$  (1.854 m), weight 71.3 kg, SpO2 97 %.    Vent Mode: PRVC FiO2 (%):  [40 %] 40 % Set Rate:  [24 bmp] 24 bmp Vt Set:  [630 mL] 630 mL PEEP:  [5 cmH20] 5 cmH20   Intake/Output Summary (Last 24 hours) at 09/20/2022 0844 Last data filed at 09/20/2022 0000 Gross per 24 hour  Intake 567.36 ml  Output 4000 ml  Net -3432.64 ml   Filed Weights   09/19/22 0500 09/19/22 1345 09/20/22 0500  Weight: 55 kg 55 kg 71.3 kg    Examination:  Physical exam: General: Chronically ill-appearing male, lying on the bed HEENT: [place]/AT, eyes anicteric.  moist mucus membranes Neuro: Alert, awake following commands Chest: Coarse breath sounds, no wheezes or rhonchi Heart: Regular rate and rhythm, no murmurs or gallops Abdomen: Soft, nontender, nondistended, bowel sounds present Skin: Has some skin cut due to dryness on the sole of the feet   Resolved Hospital Problem list     Assessment & Plan:  Acute respiratory failure with  hypoxia RLL pneumonia community-acquired pneumonia RSV pneumonia Patient self extubated yesterday Currently on nasal cannula oxygen, titrate with O2 sat goal 92% Discontinue azithromycin continue ceftriaxone for total of 5 days Respiratory pathogen panel came back positive for RSV Continue droplet precautions  ESRD on HD Acute uremic encephalopathy versus metabolic encephalopathy related to hypoxemia AGMA Flash pulmonary edema Hyperphosphatemia Received urgent hemodialysis on Monday and is a scheduled session yesterday Metabolic acidosis has improved Monitor  electrolytes Serum phosphorus 6, resume Renvela Mental status is back to baseline  Demand cardiac ischemia Troponin remained flat around 120s EKG showed no ST or T wave changes  Uncontrolled HTN After dialysis blood pressure is better controlled now Continue Coreg, resume hydralazine Continue to hold diltiazem  Hypovolemic hyponatremia: Serum sodium is improving, currently at 134 Trend electrolytes  Chronic anxiety/depression Resume Cymbalta  Best Practice (right click and "Reselect all SmartList Selections" daily)   Diet/type: Regular diet DVT prophylaxis: prophylactic heparin  GI prophylaxis: H2B Lines: N/A Foley:  N/A Code Status:  full code Last date of multidisciplinary goals of care discussion [12/6: Patient was updated at bedside   Labs   CBC: Recent Labs  Lab 09/18/22 0002 09/18/22 0011 09/18/22 0413 09/18/22 0424 09/19/22 0600  WBC 12.1*  --   --  12.9* 7.0  NEUTROABS  --   --   --  11.3*  --   HGB 10.4* 11.9* 12.9* 11.6* 10.8*  HCT 32.0* 35.0* 38.0* 34.8* 33.5*  MCV 89.1  --   --  89.0 86.3  PLT 294  --   --  312 267    Basic Metabolic Panel: Recent Labs  Lab 09/18/22 0002 09/18/22 0011 09/18/22 0413 09/18/22 0424 09/18/22 0625 09/19/22 0600  NA 131* 131* 130* 130*  --  134*  K 4.5 4.3 4.2 4.2  --  4.1  CL 91*  --   --  88*  --  96*  CO2 23  --   --  25  --  25  GLUCOSE 125*  --   --  127*  --  77  BUN 78*  --   --  80*  --  57*  CREATININE 7.81*  --   --  7.87*  --  6.33*  CALCIUM 8.2*  --   --  8.4*  --  8.1*  MG  --   --   --  2.3  --   --   PHOS  --   --   --  6.4* 6.0*  --    GFR: Estimated Creatinine Clearance: 12.4 mL/min (A) (by C-G formula based on SCr of 6.33 mg/dL (H)). Recent Labs  Lab 09/18/22 0002 09/18/22 0424 09/19/22 0600  PROCALCITON  --  0.66  --   WBC 12.1* 12.9* 7.0  LATICACIDVEN 0.9 1.0  --     Liver Function Tests: Recent Labs  Lab 09/18/22 0002 09/18/22 0424 09/19/22 0600  AST '24 27 18  '$ ALT '18 18  13  '$ ALKPHOS 150* 169* 112  BILITOT 0.8 0.8 0.7  PROT 7.6 8.3* 5.8*  ALBUMIN 2.9* 3.1* 2.1*   No results for input(s): "LIPASE", "AMYLASE" in the last 168 hours. Recent Labs  Lab 09/18/22 0625  AMMONIA 23    ABG    Component Value Date/Time   PHART 7.338 (L) 09/18/2022 0413   PCO2ART 50.3 (H) 09/18/2022 0413   PO2ART 86 09/18/2022 0413   HCO3 27.0 09/18/2022 0413   TCO2 29 09/18/2022 0413   ACIDBASEDEF 4.0 (H) 09/09/2016 1245  O2SAT 96 09/18/2022 0413     Coagulation Profile: Recent Labs  Lab 09/18/22 0002  INR 1.3*    Cardiac Enzymes: No results for input(s): "CKTOTAL", "CKMB", "CKMBINDEX", "TROPONINI" in the last 168 hours.  HbA1C: No results found for: "HGBA1C"  CBG: Recent Labs  Lab 09/19/22 0334 09/19/22 0822 09/19/22 1213 09/19/22 1646 09/19/22 2027  GLUCAP 80 69* 73 73 141*      Jacky Kindle, MD [facility] See Amion for pager If no response to pager, please call [phone] until 7pm After 7pm, Please call E-link [phone]

## 2022-09-21 ENCOUNTER — Other Ambulatory Visit: Payer: Self-pay

## 2022-09-21 ENCOUNTER — Encounter (HOSPITAL_COMMUNITY): Payer: Self-pay | Admitting: Critical Care Medicine

## 2022-09-21 DIAGNOSIS — N186 End stage renal disease: Secondary | ICD-10-CM | POA: Diagnosis not present

## 2022-09-21 DIAGNOSIS — J9601 Acute respiratory failure with hypoxia: Secondary | ICD-10-CM | POA: Diagnosis not present

## 2022-09-21 DIAGNOSIS — J189 Pneumonia, unspecified organism: Secondary | ICD-10-CM | POA: Diagnosis not present

## 2022-09-21 DIAGNOSIS — R4182 Altered mental status, unspecified: Secondary | ICD-10-CM | POA: Diagnosis not present

## 2022-09-21 LAB — CBC WITH DIFFERENTIAL/PLATELET
Abs Immature Granulocytes: 0.07 10*3/uL (ref 0.00–0.07)
Basophils Absolute: 0 10*3/uL (ref 0.0–0.1)
Basophils Relative: 0 %
Eosinophils Absolute: 0.1 10*3/uL (ref 0.0–0.5)
Eosinophils Relative: 2 %
HCT: 32.5 % — ABNORMAL LOW (ref 39.0–52.0)
Hemoglobin: 10.1 g/dL — ABNORMAL LOW (ref 13.0–17.0)
Immature Granulocytes: 1 %
Lymphocytes Relative: 8 %
Lymphs Abs: 0.5 10*3/uL — ABNORMAL LOW (ref 0.7–4.0)
MCH: 28.2 pg (ref 26.0–34.0)
MCHC: 31.1 g/dL (ref 30.0–36.0)
MCV: 90.8 fL (ref 80.0–100.0)
Monocytes Absolute: 0.7 10*3/uL (ref 0.1–1.0)
Monocytes Relative: 11 %
Neutro Abs: 5.3 10*3/uL (ref 1.7–7.7)
Neutrophils Relative %: 78 %
Platelets: 271 10*3/uL (ref 150–400)
RBC: 3.58 MIL/uL — ABNORMAL LOW (ref 4.22–5.81)
RDW: 17.5 % — ABNORMAL HIGH (ref 11.5–15.5)
WBC: 6.7 10*3/uL (ref 4.0–10.5)
nRBC: 0 % (ref 0.0–0.2)

## 2022-09-21 LAB — RENAL FUNCTION PANEL
Albumin: 2.7 g/dL — ABNORMAL LOW (ref 3.5–5.0)
Anion gap: 17 — ABNORMAL HIGH (ref 5–15)
BUN: 58 mg/dL — ABNORMAL HIGH (ref 8–23)
CO2: 24 mmol/L (ref 22–32)
Calcium: 7.9 mg/dL — ABNORMAL LOW (ref 8.9–10.3)
Chloride: 91 mmol/L — ABNORMAL LOW (ref 98–111)
Creatinine, Ser: 6.75 mg/dL — ABNORMAL HIGH (ref 0.61–1.24)
GFR, Estimated: 9 mL/min — ABNORMAL LOW (ref >60)
Glucose, Bld: 125 mg/dL — ABNORMAL HIGH (ref 70–99)
Phosphorus: 6.5 mg/dL — ABNORMAL HIGH (ref 2.5–4.6)
Potassium: 3.9 mmol/L (ref 3.5–5.1)
Sodium: 132 mmol/L — ABNORMAL LOW (ref 135–145)

## 2022-09-21 MED ORDER — LORATADINE 10 MG PO TABS
10.0000 mg | ORAL_TABLET | Freq: Every day | ORAL | Status: DC
  Administered 2022-09-22: 10 mg via ORAL
  Filled 2022-09-21: qty 1

## 2022-09-21 MED ORDER — SODIUM CHLORIDE 0.9 % IV SOLN
2.0000 g | INTRAVENOUS | Status: AC
  Administered 2022-09-21: 2 g via INTRAVENOUS
  Filled 2022-09-21: qty 20

## 2022-09-21 MED ORDER — BENZONATATE 100 MG PO CAPS
100.0000 mg | ORAL_CAPSULE | Freq: Three times a day (TID) | ORAL | Status: DC
  Administered 2022-09-21 – 2022-09-22 (×2): 100 mg via ORAL
  Filled 2022-09-21 (×2): qty 1

## 2022-09-21 MED ORDER — DILTIAZEM HCL ER COATED BEADS 180 MG PO CP24
180.0000 mg | ORAL_CAPSULE | Freq: Every day | ORAL | Status: DC
  Administered 2022-09-21: 180 mg via ORAL
  Filled 2022-09-21: qty 1

## 2022-09-21 MED ORDER — OXYMETAZOLINE HCL 0.05 % NA SOLN
1.0000 | Freq: Two times a day (BID) | NASAL | Status: DC
  Administered 2022-09-21: 1 via NASAL
  Filled 2022-09-21: qty 30

## 2022-09-21 NOTE — Progress Notes (Signed)
   09/21/22 1141  Vitals  Temp 97.7 F (36.5 C)  Temp Source Oral  BP (!) 161/96  MAP (mmHg) 116  BP Location Right Arm  BP Method Automatic  Patient Position (if appropriate) Lying  Pulse Rate 84  Pulse Rate Source Monitor  ECG Heart Rate 82  Resp 20  Oxygen Therapy  SpO2 94 %  O2 Device Room Air  During Treatment Monitoring  Blood Flow Rate (mL/min) 400 mL/min  Arterial Pressure (mmHg) -180 mmHg  Venous Pressure (mmHg) 230 mmHg  TMP (mmHg) 9 mmHg  Ultrafiltration Rate (mL/min) 1173 mL/min  Dialysate Flow Rate (mL/min) 300 ml/min  HD Safety Checks Performed Yes  Intra-Hemodialysis Comments Tx completed  Dialysis Fluid Bolus Normal Saline  Bolus Amount (mL) 300 mL  Post Treatment  Dialyzer Clearance Clear  Duration of HD Treatment -hour(s) 3.5 hour(s)  Hemodialysis Intake (mL) 0 mL  Liters Processed 81.3  Fluid Removed (mL) 3400 mL  Tolerated HD Treatment Yes  Post-Hemodialysis Comments goal met tolerated well no problems to note  AVG/AVF Arterial Site Held (minutes) 5 minutes  AVG/AVF Venous Site Held (minutes) 5 minutes  Fistula / Graft Left Upper arm Arteriovenous fistula  Placement Date/Time: 05/26/22 1008   Orientation: Left  Access Location: Upper arm  Access Type: (c) Arteriovenous fistula  Site Condition No complications  Fistula / Graft Assessment Present;Thrill;Bruit  Status Deaccessed  Drainage Description None

## 2022-09-21 NOTE — Progress Notes (Signed)
[facility] PROGRESS NOTE  Timothy [name] (DOB: May 06, 1961) EFE:[id number] PCP: [name], MD  Brief Narrative: Timothy [name] is a 61 y.o. male with a history of ESRD (HD TTS), HTN, polysubstance abuse, recent admission for GI bleed who presented to the ED on 09/17/2022 with respiratory distress. He's been leaving HD over weight lately, eating too much ice, not abiding fluid restriction. Found to be severely hypertensive in respiratory distress requiring intubation, urgently dialyzed with improvement, self extubated 12/5 and has been weaning supplemental oxygen. Positive for RSV and treated for superimposed bacterial pneumonia as well with leukocytosis and elevate PCT on admission.   Subjective: Completed dialysis today, still having nasal congestion, rhinorrhea, productive cough and shortness of breath.   Objective: BP (!) 162/100 (BP Location: Right Arm)   Pulse 86   Temp 97.6 F (36.4 C) (Oral)   Resp 17   Ht '6\' 1"'$  (1.854 m)   Wt 74.7 kg   SpO2 90%   BMI 21.73 kg/m   Gen: Thin chronically ill-appearing male in no distress Pulm: Coarse throughout with upper and lower airway sounds. Increased effort when speaking longer sentences  CV: RRR, JVD to jaw. trace edema GI: Soft, NT, ND, +BS  Neuro: Alert and oriented. No new focal deficits. Ext: Warm, no deformities. +thrill in access Skin: No new rashes, lesions or ulcers on visualized skin   Assessment & Plan: Acute hypoxic respiratory failure due to flash pulmonary edema, RLL CAP, RSV pneumonia: Overall improved. Still some JVD and increased effort with coarse lung sounds on exam.  - Continue weaning supplemental oxygen - Add symptomatic Tx for RSV - Continue ceftriaxone, planning 5 days antibiotics (12/3 - 12/7). - Continue droplet precautions  Flash pulmonary edema, ESRD:  - s/p urgent HD off schedule 12/4, then 12/5, 12/7. Has been above EDW. Still with ++JVD. Appreciate nephrology following for HD. Next session  planned to be on schedule 12/9, likely as outpatient  Demand myocardial ischemia: Due to respiratory failure. Flat troponin trend without ischemic ECG changes not consistent with ACS.   Hypertensive urgency: Improved with HD - Continue coreg, hydralazine, add back diltiazem. Unclear if he's taking this, so will start at 1/2 dose.  Anxiety, depression: Quiescent.  - Continue duloxetine.  Acute uremic encephalopathy versus metabolic encephalopathy related to hypoxemia: Resolved.  Hyponatremia:  - Manage with HD    Patrecia Pour, MD [facility] [email or web address] 09/21/2022, 3:12 PM

## 2022-09-21 NOTE — Progress Notes (Signed)
   09/21/22 0620  Vitals  Temp 98 F (36.7 C)  Temp Source Oral  BP (!) 159/102  MAP (mmHg) 122  BP Location Right Arm  BP Method Automatic  Patient Position (if appropriate) Lying  Pulse Rate 80  Pulse Rate Source Monitor  ECG Heart Rate 81  Resp 16  MEWS COLOR  MEWS Score Color Green  Oxygen Therapy  SpO2 100 %  O2 Device Nasal Cannula  O2 Flow Rate (L/min) 2 L/min  ECG Monitoring  PR interval 0.16  QRS interval 0.12  QT interval 0.46  QTc interval 0.53  CV Strip Heart Rate 81  Cardiac Rhythm NSR  MEWS Score  MEWS Temp 0  MEWS Systolic 0  MEWS Pulse 0  MEWS RR 0  MEWS LOC 0  MEWS Score 0   Received pt from 2H. VSS. Tele placed and verified x2. Refused CHG bath.  Oriented to room and call light.

## 2022-09-21 NOTE — Progress Notes (Signed)
[facility] Progress Note   Subjective:  transferred to floor yest, seen in HD this AM>  no new issues. Still coughing.   Objective Vitals:   09/21/22 1000 09/21/22 1030 09/21/22 1100 09/21/22 1130  BP: (!) 166/98 (!) 155/98 (!) 151/94 (!) 160/94  Pulse: 86 82 83 82  Resp: (!) 24 16 (!) 21 16  Temp:      TempSrc:      SpO2: 92% 93% 95% 97%  Weight:      Height:       Physical Exam General: awake alert comfortable Heart: RRR Lungs: coarse BL no rales Abdomen:soft Extremities: no edema Dialysis Access:  LUE AVF + t/b Qb 400 on HD today  Additional Objective Labs: Basic Metabolic Panel: Recent Labs  Lab 09/18/22 0424 09/18/22 0625 09/19/22 0600 09/21/22 0830  NA 130*  --  134* 132*  K 4.2  --  4.1 3.9  CL 88*  --  96* 91*  CO2 25  --  25 24  GLUCOSE 127*  --  77 125*  BUN 80*  --  57* 58*  CREATININE 7.87*  --  6.33* 6.75*  CALCIUM 8.4*  --  8.1* 7.9*  PHOS 6.4* 6.0*  --  6.5*    Liver Function Tests: Recent Labs  Lab 09/18/22 0002 09/18/22 0424 09/19/22 0600 09/21/22 0830  AST '24 27 18  '$ --   ALT '18 18 13  '$ --   ALKPHOS 150* 169* 112  --   BILITOT 0.8 0.8 0.7  --   PROT 7.6 8.3* 5.8*  --   ALBUMIN 2.9* 3.1* 2.1* 2.7*    No results for input(s): "LIPASE", "AMYLASE" in the last 168 hours. CBC: Recent Labs  Lab 09/18/22 0002 09/18/22 0011 09/18/22 0424 09/19/22 0600 09/21/22 0830  WBC 12.1*  --  12.9* 7.0 6.7  NEUTROABS  --   --  11.3*  --  5.3  HGB 10.4*   < > 11.6* 10.8* 10.1*  HCT 32.0*   < > 34.8* 33.5* 32.5*  MCV 89.1  --  89.0 86.3 90.8  PLT 294  --  312 271 271   < > = values in this interval not displayed.    Blood Culture    Component Value Date/Time   SDES BLOOD BLOOD RIGHT HAND 09/18/2022 0625   SDES BLOOD BLOOD RIGHT HAND 09/18/2022 0625   SPECREQUEST  09/18/2022 0625    BOTTLES DRAWN AEROBIC AND ANAEROBIC Blood Culture adequate volume   SPECREQUEST  09/18/2022 0625    BOTTLES DRAWN AEROBIC AND ANAEROBIC Blood  Culture adequate volume   CULT  09/18/2022 0625    NO GROWTH 3 DAYS Performed at [facility], [place] [address], [place], [place] [address]    CULT  09/18/2022 [phone]    NO GROWTH 3 DAYS Performed at [facility], [place] [address], [place], [place] [address]    REPTSTATUS PENDING 09/18/2022 8315   REPTSTATUS PENDING 09/18/2022 1761    Cardiac Enzymes: No results for input(s): "CKTOTAL", "CKMB", "CKMBINDEX", "TROPONINI" in the last 168 hours. CBG: Recent Labs  Lab 09/19/22 0334 09/19/22 0822 09/19/22 1213 09/19/22 1646 09/19/22 2027  GLUCAP 80 69* 73 73 141*    Iron Studies: No results for input(s): "IRON", "TIBC", "TRANSFERRIN", "FERRITIN" in the last 72 hours. '@lablastinr3'$ @ Studies/Results: No results found. Medications:  cefTRIAXone (ROCEPHIN)  IV Stopped (09/20/22 2135)    carvedilol  6.25 mg Oral BID WC   Chlorhexidine Gluconate Cloth  6 each  Topical Q0600   docusate  100 mg Per Tube BID   DULoxetine  20 mg Oral Daily   famotidine  20 mg Per Tube BID   heparin injection (subcutaneous)  5,000 Units Subcutaneous Q8H   hydrALAZINE  25 mg Oral Q8H   polyethylene glycol  17 g Per Tube Daily   sevelamer carbonate  1,600 mg Oral TID WC     OP HD: [place] [facility]  3h 64mn   450/1.5   70.5kg  2/2 bath  LUA AVF  Hep none - last HD 12/02, post wt 78kg - has been coming off 5-9 kg over the last 2 wks - doxercalciferol 4 ug IV tts - mircera 225 ug IV q2, last 11/30, due 12/13    CXR 12/04 - IMPRESSION: Cardiomegaly, vascular congestion. Right lower lobe opacity concerning for pneumonia.   Assessment/ Plan: AHRF/ vol overload/ pulm edema ? +RSV-  HD Mon and Tues for UF, maximize UF today  HTN - normotensive now Volume - coming off 4- 8 kg over dry wt for the past 2 wks. per RN declined extra HD offered outpt -- dialing volume down here and getting close to EDW.  Should achieve EDW with today tx - says eats ice incessantly and that's his problem.  ESRD - on HD TTS. Last  HD was Saturday PTA - then HD Mon, Tues for volume. HD today. Atrial fib H/o colon cancer H/o GI bleed Anemia esrd - Hb 10s.  No esa needs.  MBD ckd - corr ca ok, phos 6s - cont binders and VDRA.   LJannifer HickMD 09/21/2022, 11:39 AM  [facility] Pager: [phone]

## 2022-09-21 NOTE — Care Management Important Message (Signed)
Important Message  Patient Details  Name: Timothy [name] MRN: [id number] Date of Birth: [birth year]   Medicare Important Message Given:  Yes     Shelda Altes 09/21/2022, 12:56 PM

## 2022-09-22 DIAGNOSIS — J9601 Acute respiratory failure with hypoxia: Secondary | ICD-10-CM | POA: Diagnosis not present

## 2022-09-22 MED ORDER — DILTIAZEM HCL ER COATED BEADS 180 MG PO CP24
360.0000 mg | ORAL_CAPSULE | Freq: Every day | ORAL | Status: DC
  Administered 2022-09-22: 360 mg via ORAL
  Filled 2022-09-22: qty 2

## 2022-09-22 MED ORDER — CARVEDILOL 6.25 MG PO TABS: 6.2500 mg | ORAL_TABLET | Freq: Two times a day (BID) | ORAL | 0 refills | Status: AC

## 2022-09-22 MED ORDER — DILTIAZEM HCL ER COATED BEADS 180 MG PO CP24: 360.0000 mg | ORAL_CAPSULE | Freq: Every day | ORAL | Status: DC

## 2022-09-22 MED ORDER — HYDRALAZINE HCL 25 MG PO TABS: 25.0000 mg | ORAL_TABLET | Freq: Once | ORAL | Status: DC

## 2022-09-22 MED ORDER — HYDRALAZINE HCL 25 MG PO TABS
25.0000 mg | ORAL_TABLET | Freq: Once | ORAL | Status: AC
  Administered 2022-09-22: 25 mg via ORAL
  Filled 2022-09-22: qty 1

## 2022-09-22 MED ORDER — HYDRALAZINE HCL 50 MG PO TABS: 50.0000 mg | ORAL_TABLET | Freq: Three times a day (TID) | ORAL | Status: DC

## 2022-09-22 MED ORDER — HYDRALAZINE HCL 50 MG PO TABS: 50.0000 mg | ORAL_TABLET | Freq: Two times a day (BID) | ORAL | 0 refills | Status: DC

## 2022-09-22 NOTE — Progress Notes (Signed)
Pt is refusing to have CBG checks and CHG bath. Pt states he is going home today. Pt resting with call bell within reach.  Will continue to monitor. Payton Emerald, RN

## 2022-09-22 NOTE — Discharge Instructions (Signed)
It's very important that you take the blood pressure medications as directed. This includes diltiazem (which you still have at home) as well as coreg and hydralazine (both have been refilled at your [facility]). Continue dialysis on your usual routine.   You have RSV (a virus that caused pneumonia) which is improving. You have completed treatment for this in the hospital.   Seek medical attention right away if you experience new shortness of breath or fever or other new problems.

## 2022-09-22 NOTE — Discharge Summary (Signed)
Physician Discharge Summary   Patient: Timothy [name] MRN: [id number] DOB: [birth year]  Admit date:     09/17/2022  Discharge date: 09/22/22  Discharge Physician: Patrecia Pour   PCP: [name], MD   Recommendations at discharge:  Follow up with PCP for continued BP management. Refilled coreg and hydralazine at discharge after admission for HTN emergency.  Continue routine HD.   Discharge Diagnoses: Principal Problem:   Acute hypoxic respiratory failure (HCC) Active Problems:   Acute respiratory failure with hypoxia (HCC)   Pneumonia of right lower lobe due to infectious organism   ESRD on dialysis [facility])   Altered mental status  Hospital Course: Timothy [name] is a 61 y.o. male with a history of ESRD (HD TTS), HTN, polysubstance abuse, recent admission for GI bleed who presented to the ED on 09/17/2022 with respiratory distress. He's been leaving HD over weight lately, eating too much ice, not abiding fluid restriction. Found to be severely hypertensive in respiratory distress requiring intubation, urgently dialyzed with improvement, self extubated 12/5 and has been weaning supplemental oxygen. Positive for RSV and treated for superimposed bacterial pneumonia as well with leukocytosis and elevated PCT on admission.  He has normal respiratory status with room air saturations 98% and requesting discharge on the morning of 12/8 having completed a course of antibiotics, has remained afebrile with resolution of mild leukocytosis. Blood pressure much better controlled on regimen nearing his home regimen which is prescribed (refilled) at discharge.   Assessment and Plan: Acute hypoxic respiratory failure due to flash pulmonary edema, RLL CAP, RSV pneumonia: Overall improved. No longer hypoxic or dyspneic.  - Can continue OTC symptomatic Tx for RSV - Completed 5 days antibiotics (12/3 - 12/7).   Flash pulmonary edema, ESRD:  - s/p urgent HD off schedule 12/4, then 12/5, 12/7. Has been  above EDW as outpatient, appears roughly euvolemic on day of discharge. Next session planned to be on schedule 12/9, likely as outpatient   Demand myocardial ischemia: Due to respiratory failure. Flat troponin trend without ischemic ECG changes not consistent with ACS.    Hypertensive urgency: Improved with HD - Continue coreg, hydralazine, and diltiazem. Doubt he was taking these PTA.   Anxiety, depression: Quiescent.  - Continue duloxetine.   Acute uremic encephalopathy versus metabolic encephalopathy related to hypoxemia: Resolved.   Hyponatremia:  - Manage with HD  Consultants: Nephrology, PCCM Procedures performed: HD, ETT  Disposition: Home Diet recommendation: Renal, cardiac DISCHARGE MEDICATION: Allergies as of 09/22/2022   No Known Allergies      Medication List     TAKE these medications    albuterol 108 (90 Base) MCG/ACT inhaler Commonly known as: VENTOLIN HFA Inhale 2 puffs into the lungs every 6 (six) hours as needed for wheezing or shortness of breath.   carvedilol 6.25 MG tablet Commonly known as: COREG Take 1 tablet (6.25 mg total) by mouth 2 (two) times daily with a meal.   diltiazem 360 MG 24 hr capsule Commonly known as: CARDIZEM CD Take 1 capsule (360 mg total) by mouth daily.   DULoxetine 20 MG capsule Commonly known as: CYMBALTA Take 20 mg by mouth daily.   hydrALAZINE 50 MG tablet Commonly known as: APRESOLINE Take 1 tablet (50 mg total) by mouth 2 (two) times daily.   lidocaine 4 % cream Commonly known as: LMX Apply 1 Application topically 3 (three) times a week.   LIDOCAINE-PRILOCAINE EX Apply 1 application  topically Every Tuesday,Thursday,and Saturday with dialysis.   montelukast 10 MG tablet  Commonly known as: SINGULAIR Take 10 mg by mouth at bedtime.   oxyCODONE 5 MG immediate release tablet Commonly known as: Oxy IR/ROXICODONE Take 5 mg by mouth 3 (three) times daily as needed for moderate pain or severe pain.    pantoprazole 40 MG tablet Commonly known as: PROTONIX Take 1 tablet (40 mg total) by mouth 2 (two) times daily before a meal.   sevelamer carbonate 800 MG tablet Commonly known as: RENVELA Take 1,600 mg by mouth 4 (four) times daily.        Follow-up Information     Osei-Bonsu, Iona Beard, MD. Schedule an appointment as soon as possible for a visit.   Specialty: Internal Medicine Contact information: [phone] [address] [place] [facility].   Contact information: [place] [address] [phone]                Discharge Exam: Filed Weights   09/21/22 0500 09/21/22 0800 09/22/22 0500  Weight: 73.7 kg 74.7 kg 72.2 kg  BP (!) 170/99 (BP Location: Right Arm)   Pulse 82   Temp 97.8 F (36.6 C) (Oral)   Resp 20   Ht '6\' 1"'$  (1.854 m)   Wt 72.2 kg   SpO2 98%   BMI 21.00 kg/m   No distress Scattered rhonchi with normal effort good aeration, normal duration. No crackles RRR, no pitting edema  Condition at discharge: stable  The results of significant diagnostics from this hospitalization (including imaging, microbiology, ancillary and laboratory) are listed below for reference.   Imaging Studies: ECHOCARDIOGRAM COMPLETE  Result Date: 09/18/2022    ECHOCARDIOGRAM REPORT   Patient Name:   Timothy [name] Date of Exam: 09/18/2022 Medical Rec #:  [id number]       Height:       73.0 in Accession #:    [id number]      Weight:       155.6 lb Date of Birth:  [birth year]       BSA:          1.934 m Patient Age:    33 years        BP:           133/96 mmHg Patient Gender: M               HR:           66 bpm. Exam Location:  Inpatient Procedure: 2D Echo, Color Doppler and Cardiac Doppler Indications:    Elevated troponin  History:        Patient has prior history of Echocardiogram examinations, most                 recent 02/14/2022. CHF; Risk Factors:Dyslipidemia. DOE. CKD.                  Polysusbstance abuse.  Sonographer:    Clayton Lefort RDCS (AE) Referring Phys: [id number] Kayleen Memos  Sonographer Comments: Echo performed with patient supine and on artificial respirator. IMPRESSIONS  1. Left ventricular ejection fraction, by estimation, is 40 to 45%. The left ventricle has mildly decreased function. The left ventricular internal cavity size was moderately dilated. There is severe concentric left ventricular hypertrophy.  2. Right ventricular systolic function is moderately reduced. The right ventricular size is mildly enlarged. Mildly increased right ventricular wall thickness.  3. Left atrial size was mildly dilated.  4. Right atrial  size was mildly dilated.  5. Mild mitral valve regurgitation.  6. AV is thickened, calcified Appears functionally bicuspid. Peak and mean gradients through the valve are 7 and 4 mm Hg respectively. AVA (VTI) is 3.22 cm2. DVI is 0.85 consistent with mild AS.Marland Kitchen The aortic valve is bicuspid. Aortic valve regurgitation is mild.  7. Aortic dilatation noted. There is moderate dilatation of the ascending aorta, measuring 43 mm.  8. The inferior vena cava is normal in size with greater than 50% respiratory variability, suggesting right atrial pressure of 3 mmHg. FINDINGS  Left Ventricle: Left ventricular ejection fraction, by estimation, is 40 to 45%. The left ventricle has mildly decreased function. The left ventricular internal cavity size was moderately dilated. There is severe concentric left ventricular hypertrophy. Right Ventricle: The right ventricular size is mildly enlarged. Mildly increased right ventricular wall thickness. Right ventricular systolic function is moderately reduced. Left Atrium: Left atrial size was mildly dilated. Right Atrium: Right atrial size was mildly dilated. Pericardium: There is no evidence of pericardial effusion. Mitral Valve: There is mild thickening of the mitral valve leaflet(s). Mild to moderate mitral annular calcification. Mild mitral  valve regurgitation. Tricuspid Valve: The tricuspid valve is normal in structure. Tricuspid valve regurgitation is mild. Aortic Valve: AV is thickened, calcified Appears functionally bicuspid. Peak and mean gradients through the valve are 7 and 4 mm Hg respectively. AVA (VTI) is 3.22 cm2. DVI is 0.85 consistent with mild AS. The aortic valve is bicuspid. Aortic valve regurgitation is mild. Aortic valve mean gradient measures 4.0 mmHg. Aortic valve peak gradient measures 7.2 mmHg. Aortic valve area, by VTI measures 3.22 cm. Pulmonic Valve: The pulmonic valve was not well visualized. Pulmonic valve regurgitation is not visualized. No evidence of pulmonic stenosis. Aorta: Aortic dilatation noted. There is moderate dilatation of the ascending aorta, measuring 43 mm. Venous: The inferior vena cava is normal in size with greater than 50% respiratory variability, suggesting right atrial pressure of 3 mmHg. IAS/Shunts: No atrial level shunt detected by color flow Doppler.  LEFT VENTRICLE PLAX 2D LVIDd:         5.30 cm      Diastology LVIDs:         4.20 cm      LV e' medial:    6.42 cm/s LV PW:         1.60 cm      LV E/e' medial:  12.0 LV IVS:        1.80 cm      LV e' lateral:   9.03 cm/s LVOT diam:     2.20 cm      LV E/e' lateral: 8.5 LV SV:         82 LV SV Index:   43 LVOT Area:     3.80 cm  LV Volumes (MOD) LV vol d, MOD A2C: 172.0 ml LV vol d, MOD A4C: 189.0 ml LV vol s, MOD A2C: 98.2 ml LV vol s, MOD A4C: 88.2 ml LV SV MOD A2C:     73.8 ml LV SV MOD A4C:     189.0 ml LV SV MOD BP:      85.4 ml RIGHT VENTRICLE RV S prime:     9.90 cm/s TAPSE (M-mode): 1.6 cm LEFT ATRIUM             Index        RIGHT ATRIUM           Index LA diam:  4.90 cm 2.53 cm/m   RA Area:     20.10 cm LA Vol (A2C):   62.1 ml 32.11 ml/m  RA Volume:   55.50 ml  28.69 ml/m LA Vol (A4C):   53.5 ml 27.66 ml/m LA Biplane Vol: 69.0 ml 35.67 ml/m  AORTIC VALVE AV Area (Vmax):    3.38 cm AV Area (Vmean):   3.07 cm AV Area (VTI):      3.22 cm AV Vmax:           134.00 cm/s AV Vmean:          92.200 cm/s AV VTI:            0.256 m AV Peak Grad:      7.2 mmHg AV Mean Grad:      4.0 mmHg LVOT Vmax:         119.00 cm/s LVOT Vmean:        74.500 cm/s LVOT VTI:          0.217 m LVOT/AV VTI ratio: 0.85  AORTA Ao Root diam: 3.10 cm Ao Asc diam:  4.30 cm MITRAL VALVE               TRICUSPID VALVE MV Area (PHT): 4.19 cm    TR Peak grad:   23.0 mmHg MV Decel Time: 181 msec    TR Vmax:        240.00 cm/s MV E velocity: 76.80 cm/s MV A velocity: 54.80 cm/s  SHUNTS MV E/A ratio:  1.40        Systemic VTI:  0.22 m                            Systemic Diam: 2.20 cm Dorris Carnes MD Electronically signed by Dorris Carnes MD Signature Date/Time: 09/18/2022/7:22:07 PM    Final    DG Abd Portable 1V  Result Date: 09/18/2022 CLINICAL DATA:  Orogastric tube placement EXAM: PORTABLE ABDOMEN - 1 VIEW COMPARISON:  Earlier films from the same day FINDINGS: Gastric tube has been advanced into the decompressed stomach. Small bowel and colon are nondilated. Embolization coils project in the mid abdomen. IMPRESSION: Gastric tube advanced into the stomach. Electronically Signed   By: Lucrezia Europe M.D.   On: 09/18/2022 09:07   DG Chest Port 1 View  Result Date: 09/18/2022 CLINICAL DATA:  Endotracheal tube placement EXAM: PORTABLE CHEST 1 VIEW COMPARISON:  09/18/2022 FINDINGS: Endotracheal tube tip is at the level of the clavicular heads. The side port and tip of the nasogastric tube are below the field of view. Unchanged appearance of right middle lobe consolidation IMPRESSION: 1. Endotracheal tube tip is at the level of the clavicular heads. 2. Unchanged right middle lobe consolidation. Electronically Signed   By: Ulyses Jarred M.D.   On: 09/18/2022 03:32   DG Chest Port 1 View  Result Date: 09/18/2022 CLINICAL DATA:  Shortness of breath, respiratory distress EXAM: PORTABLE CHEST 1 VIEW COMPARISON:  08/14/2022 FINDINGS: Cardiomegaly, vascular congestion. Right lower lobe  airspace opacity/consolidation. Left mid lung atelectasis. No effusions or acute bony abnormality. IMPRESSION: No megaly, vascular congestion. Right lower lobe opacity concerning for pneumonia. Electronically Signed   By: Rolm Baptise M.D.   On: 09/18/2022 00:21    Microbiology: Results for orders placed or performed during the hospital encounter of 09/17/22  MRSA Next Gen by PCR, Nasal     Status: None   Collection Time: 09/18/22  6:03 AM  Result  Value Ref Range Status   MRSA by PCR Next Gen NOT DETECTED NOT DETECTED Final    Comment: (NOTE) The GeneXpert MRSA Assay (FDA approved for NASAL specimens only), is one component of a comprehensive MRSA colonization surveillance program. It is not intended to diagnose MRSA infection nor to guide or monitor treatment for MRSA infections. Test performance is not FDA approved in patients less than 74 years old. Performed at [facility], [place] [address] [place], [place] [address]   Culture, blood (Routine X 2) w Reflex to ID Panel     Status: None (Preliminary result)   Collection Time: 09/18/22  6:25 AM   Specimen: BLOOD  Result Value Ref Range Status   Specimen Description BLOOD BLOOD RIGHT HAND  Final   Special Requests   Final    BOTTLES DRAWN AEROBIC AND ANAEROBIC Blood Culture adequate volume   Culture   Final    NO GROWTH 4 DAYS Performed at [facility], [place] [address], [place], [place] [address]    Report Status PENDING  Incomplete  Culture, blood (Routine X 2) w Reflex to ID Panel     Status: None (Preliminary result)   Collection Time: 09/18/22  6:25 AM   Specimen: BLOOD  Result Value Ref Range Status   Specimen Description BLOOD BLOOD RIGHT HAND  Final   Special Requests   Final    BOTTLES DRAWN AEROBIC AND ANAEROBIC Blood Culture adequate volume   Culture   Final    NO GROWTH 4 DAYS Performed at [facility], [place] [address], [place], [place] [address]    Report Status PENDING  Incomplete  Respiratory  (~20 pathogens) panel by PCR     Status: Abnormal   Collection Time: 09/18/22  6:31 AM   Specimen: Nasopharyngeal Swab; Respiratory  Result Value Ref Range Status   Adenovirus NOT DETECTED NOT DETECTED Final   Coronavirus 229E NOT DETECTED NOT DETECTED Final    Comment: (NOTE) The Coronavirus on the Respiratory Panel, DOES NOT test for the novel  Coronavirus (2019 nCoV)    Coronavirus HKU1 NOT DETECTED NOT DETECTED Final   Coronavirus NL63 NOT DETECTED NOT DETECTED Final   Coronavirus OC43 NOT DETECTED NOT DETECTED Final   Metapneumovirus NOT DETECTED NOT DETECTED Final   Rhinovirus / Enterovirus NOT DETECTED NOT DETECTED Final   Influenza A NOT DETECTED NOT DETECTED Final   Influenza B NOT DETECTED NOT DETECTED Final   Parainfluenza Virus 1 NOT DETECTED NOT DETECTED Final   Parainfluenza Virus 2 NOT DETECTED NOT DETECTED Final   Parainfluenza Virus 3 NOT DETECTED NOT DETECTED Final   Parainfluenza Virus 4 NOT DETECTED NOT DETECTED Final   Respiratory Syncytial Virus DETECTED (A) NOT DETECTED Final   Bordetella pertussis NOT DETECTED NOT DETECTED Final   Bordetella Parapertussis NOT DETECTED NOT DETECTED Final   Chlamydophila pneumoniae NOT DETECTED NOT DETECTED Final   Mycoplasma pneumoniae NOT DETECTED NOT DETECTED Final    Comment: Performed at [facility], [place] [address] [place], [place] [address]  Resp Panel by RT-PCR (Flu A&B, Covid) Anterior Nasal Swab     Status: None   Collection Time: 09/18/22  6:31 AM   Specimen: Anterior Nasal Swab  Result Value Ref Range Status   SARS Coronavirus 2 by RT PCR NEGATIVE NEGATIVE Final    Comment: (NOTE) SARS-CoV-2 target nucleic acids are NOT DETECTED.  The SARS-CoV-2 RNA is generally detectable in upper respiratory specimens during the acute phase of  infection. The lowest concentration of SARS-CoV-2 viral copies this assay can detect is 138 copies/mL. A negative result does not preclude SARS-Cov-2 infection and should not be  used as the sole basis for treatment or other patient management decisions. A negative result may occur with  improper specimen collection/handling, submission of specimen other than nasopharyngeal swab, presence of viral mutation(s) within the areas targeted by this assay, and inadequate number of viral copies(<138 copies/mL). A negative result must be combined with clinical observations, patient history, and epidemiological information. The expected result is Negative.  Fact Sheet for Patients:  EntrepreneurPulse.com.au  Fact Sheet for Healthcare Providers:  IncredibleEmployment.be  This test is no t yet approved or cleared by the [place] FDA and  has been authorized for detection and/or diagnosis of SARS-CoV-2 by FDA under an Emergency Use Authorization (EUA). This EUA will remain  in effect (meaning this test can be used) for the duration of the COVID-19 declaration under Section 564(b)(1) of the Act, 21 U.S.C.section 360bbb-3(b)(1), unless the authorization is terminated  or revoked sooner.       Influenza A by PCR NEGATIVE NEGATIVE Final   Influenza B by PCR NEGATIVE NEGATIVE Final    Comment: (NOTE) The Xpert Xpress SARS-CoV-2/FLU/RSV plus assay is intended as an aid in the diagnosis of influenza from Nasopharyngeal swab specimens and should not be used as a sole basis for treatment. Nasal washings and aspirates are unacceptable for Xpert Xpress SARS-CoV-2/FLU/RSV testing.  Fact Sheet for Patients: EntrepreneurPulse.com.au  Fact Sheet for Healthcare Providers: IncredibleEmployment.be  This test is not yet approved or cleared by the [place] FDA and has been authorized for detection and/or diagnosis of SARS-CoV-2 by FDA under an Emergency Use Authorization (EUA). This EUA will remain in effect (meaning this test can be used) for the duration of the COVID-19 declaration under Section  564(b)(1) of the Act, 21 U.S.C. section 360bbb-3(b)(1), unless the authorization is terminated or revoked.  Performed at [facility], [place] [address], [place], [place] [address]     Labs: CBC: Recent Labs  Lab 09/18/22 0002 09/18/22 0011 09/18/22 0413 09/18/22 0424 09/19/22 0600 09/21/22 0830  WBC 12.1*  --   --  12.9* 7.0 6.7  NEUTROABS  --   --   --  11.3*  --  5.3  HGB 10.4* 11.9* 12.9* 11.6* 10.8* 10.1*  HCT 32.0* 35.0* 38.0* 34.8* 33.5* 32.5*  MCV 89.1  --   --  89.0 86.3 90.8  PLT 294  --   --  312 271 201   Basic Metabolic Panel: Recent Labs  Lab 09/18/22 0002 09/18/22 0011 09/18/22 0413 09/18/22 0424 09/18/22 0625 09/19/22 0600 09/21/22 0830  NA 131* 131* 130* 130*  --  134* 132*  K 4.5 4.3 4.2 4.2  --  4.1 3.9  CL 91*  --   --  88*  --  96* 91*  CO2 23  --   --  25  --  25 24  GLUCOSE 125*  --   --  127*  --  77 125*  BUN 78*  --   --  80*  --  57* 58*  CREATININE 7.81*  --   --  7.87*  --  6.33* 6.75*  CALCIUM 8.2*  --   --  8.4*  --  8.1* 7.9*  MG  --   --   --  2.3  --   --   --   PHOS  --   --   --  6.4* 6.0*  --  6.5*   Liver Function Tests: Recent Labs  Lab 09/18/22 0002 09/18/22 0424 09/19/22 0600 09/21/22 0830  AST '24 27 18  '$ --   ALT '18 18 13  '$ --   ALKPHOS 150* 169* 112  --   BILITOT 0.8 0.8 0.7  --   PROT 7.6 8.3* 5.8*  --   ALBUMIN 2.9* 3.1* 2.1* 2.7*   CBG: Recent Labs  Lab 09/19/22 0334 09/19/22 0822 09/19/22 1213 09/19/22 1646 09/19/22 2027  GLUCAP 80 69* 73 73 141*    Discharge time spent: greater than 30 minutes.  Signed: Patrecia Pour, MD [facility] 09/22/2022

## 2022-09-22 NOTE — Progress Notes (Signed)
Pt d/c today. Contacted [facility] to advise clinic of pt's d/c today and that pt should resume care tomorrow.   [name] Renal Navigator [phone]

## 2022-09-23 LAB — CULTURE, BLOOD (ROUTINE X 2)
Culture: NO GROWTH
Culture: NO GROWTH
Special Requests: ADEQUATE
Special Requests: ADEQUATE

## 2022-09-24 ENCOUNTER — Telehealth (HOSPITAL_COMMUNITY): Payer: Self-pay | Admitting: Nephrology

## 2022-09-24 NOTE — Telephone Encounter (Signed)
Transition of care contact from inpatient facility  Date of discharge: 09/22/22 Date of contact: 09/24/22 Method: Phone Spoke to: Patient  Patient contacted to discuss transition of care from recent inpatient hospitalization. Patient was admitted to [facility] from 12/3-12/8/23  with discharge diagnosis of acute hypoxic resp failure, + RSV  Medication changes were reviewed. He reports that he has every thing.  Patient will follow up with his/her outpatient HD unit on: Tuesday 12/12 - says he will be there.  Veneta Penton, PA-C [employer] Pager [phone]

## 2022-09-25 LAB — OXYCODONES,MS,WB/SP RFX
Oxycocone: 7.6 ng/mL
Oxycodones Confirmation: POSITIVE
Oxymorphone: NEGATIVE ng/mL

## 2022-09-28 LAB — DRUG SCREEN 10 W/CONF, SERUM
Amphetamines, IA: NEGATIVE ng/mL
Barbiturates, IA: NEGATIVE ug/mL
Benzodiazepines, IA: NEGATIVE ng/mL
Cocaine & Metabolite, IA: NEGATIVE ng/mL
Methadone, IA: NEGATIVE ng/mL
Opiates, IA: NEGATIVE ng/mL
Oxycodones, IA: POSITIVE ng/mL — AB
Phencyclidine, IA: NEGATIVE ng/mL
Propoxyphene, IA: NEGATIVE ng/mL
THC(Marijuana) Metabolite, IA: POSITIVE ng/mL — AB

## 2022-09-28 LAB — THC,MS,WB/SP RFX
Cannabidiol: NEGATIVE ng/mL
Cannabinoid Confirmation: POSITIVE
Carboxy-THC: 13 ng/mL
Hydroxy-THC: NEGATIVE ng/mL
Tetrahydrocannabinol(THC): 1.1 ng/mL

## 2022-10-02 ENCOUNTER — Inpatient Hospital Stay (HOSPITAL_COMMUNITY)
Admission: EM | Admit: 2022-10-02 | Discharge: 2022-10-05 | DRG: 291 | Disposition: A | Discharge status: Home / Self Care | Payer: Medicare Other | Attending: Internal Medicine | Admitting: Internal Medicine

## 2022-10-02 ENCOUNTER — Emergency Department (HOSPITAL_COMMUNITY): Payer: Medicare Other

## 2022-10-02 ENCOUNTER — Other Ambulatory Visit: Payer: Self-pay

## 2022-10-02 DIAGNOSIS — I132 Hypertensive heart and chronic kidney disease with heart failure and with stage 5 chronic kidney disease, or end stage renal disease: Secondary | ICD-10-CM | POA: Diagnosis present

## 2022-10-02 DIAGNOSIS — J9601 Acute respiratory failure with hypoxia: Secondary | ICD-10-CM | POA: Diagnosis present

## 2022-10-02 DIAGNOSIS — Z79899 Other long term (current) drug therapy: Secondary | ICD-10-CM | POA: Diagnosis not present

## 2022-10-02 DIAGNOSIS — N2581 Secondary hyperparathyroidism of renal origin: Secondary | ICD-10-CM | POA: Diagnosis present

## 2022-10-02 DIAGNOSIS — M898X9 Other specified disorders of bone, unspecified site: Secondary | ICD-10-CM | POA: Diagnosis present

## 2022-10-02 DIAGNOSIS — Z841 Family history of disorders of kidney and ureter: Secondary | ICD-10-CM | POA: Diagnosis not present

## 2022-10-02 DIAGNOSIS — I340 Nonrheumatic mitral (valve) insufficiency: Secondary | ICD-10-CM | POA: Diagnosis present

## 2022-10-02 DIAGNOSIS — N186 End stage renal disease: Secondary | ICD-10-CM | POA: Diagnosis present

## 2022-10-02 DIAGNOSIS — R7989 Other specified abnormal findings of blood chemistry: Secondary | ICD-10-CM | POA: Diagnosis not present

## 2022-10-02 DIAGNOSIS — Z1152 Encounter for screening for COVID-19: Secondary | ICD-10-CM | POA: Diagnosis not present

## 2022-10-02 DIAGNOSIS — J159 Unspecified bacterial pneumonia: Secondary | ICD-10-CM | POA: Diagnosis present

## 2022-10-02 DIAGNOSIS — D631 Anemia in chronic kidney disease: Secondary | ICD-10-CM | POA: Diagnosis present

## 2022-10-02 DIAGNOSIS — Y95 Nosocomial condition: Secondary | ICD-10-CM | POA: Diagnosis present

## 2022-10-02 DIAGNOSIS — J189 Pneumonia, unspecified organism: Secondary | ICD-10-CM | POA: Diagnosis present

## 2022-10-02 DIAGNOSIS — Z992 Dependence on renal dialysis: Secondary | ICD-10-CM

## 2022-10-02 DIAGNOSIS — Z833 Family history of diabetes mellitus: Secondary | ICD-10-CM

## 2022-10-02 DIAGNOSIS — E8779 Other fluid overload: Principal | ICD-10-CM

## 2022-10-02 DIAGNOSIS — Z8249 Family history of ischemic heart disease and other diseases of the circulatory system: Secondary | ICD-10-CM | POA: Diagnosis not present

## 2022-10-02 DIAGNOSIS — D638 Anemia in other chronic diseases classified elsewhere: Secondary | ICD-10-CM | POA: Diagnosis present

## 2022-10-02 DIAGNOSIS — E162 Hypoglycemia, unspecified: Secondary | ICD-10-CM | POA: Diagnosis present

## 2022-10-02 DIAGNOSIS — I5023 Acute on chronic systolic (congestive) heart failure: Secondary | ICD-10-CM | POA: Diagnosis present

## 2022-10-02 DIAGNOSIS — Z85038 Personal history of other malignant neoplasm of large intestine: Secondary | ICD-10-CM

## 2022-10-02 DIAGNOSIS — F32A Depression, unspecified: Secondary | ICD-10-CM | POA: Diagnosis present

## 2022-10-02 DIAGNOSIS — F419 Anxiety disorder, unspecified: Secondary | ICD-10-CM | POA: Diagnosis present

## 2022-10-02 DIAGNOSIS — I160 Hypertensive urgency: Secondary | ICD-10-CM | POA: Diagnosis present

## 2022-10-02 DIAGNOSIS — I2489 Other forms of acute ischemic heart disease: Secondary | ICD-10-CM | POA: Diagnosis present

## 2022-10-02 DIAGNOSIS — Z91119 Patient's noncompliance with dietary regimen due to unspecified reason: Secondary | ICD-10-CM

## 2022-10-02 DIAGNOSIS — Z87891 Personal history of nicotine dependence: Secondary | ICD-10-CM

## 2022-10-02 DIAGNOSIS — N400 Enlarged prostate without lower urinary tract symptoms: Secondary | ICD-10-CM | POA: Diagnosis present

## 2022-10-02 DIAGNOSIS — T465X6A Underdosing of other antihypertensive drugs, initial encounter: Secondary | ICD-10-CM | POA: Diagnosis present

## 2022-10-02 DIAGNOSIS — B974 Respiratory syncytial virus as the cause of diseases classified elsewhere: Secondary | ICD-10-CM | POA: Diagnosis present

## 2022-10-02 DIAGNOSIS — R0603 Acute respiratory distress: Secondary | ICD-10-CM | POA: Diagnosis not present

## 2022-10-02 DIAGNOSIS — Z91148 Patient's other noncompliance with medication regimen for other reason: Secondary | ICD-10-CM

## 2022-10-02 LAB — CBC
HCT: 32.1 % — ABNORMAL LOW (ref 39.0–52.0)
Hemoglobin: 10.5 g/dL — ABNORMAL LOW (ref 13.0–17.0)
MCH: 28.6 pg (ref 26.0–34.0)
MCHC: 32.7 g/dL (ref 30.0–36.0)
MCV: 87.5 fL (ref 80.0–100.0)
Platelets: 177 10*3/uL (ref 150–400)
RBC: 3.67 MIL/uL — ABNORMAL LOW (ref 4.22–5.81)
RDW: 17.3 % — ABNORMAL HIGH (ref 11.5–15.5)
WBC: 7.6 10*3/uL (ref 4.0–10.5)
nRBC: 0 % (ref 0.0–0.2)

## 2022-10-02 LAB — CBG MONITORING, ED
Glucose-Capillary: 61 mg/dL — ABNORMAL LOW (ref 70–99)
Glucose-Capillary: 77 mg/dL (ref 70–99)

## 2022-10-02 LAB — COMPREHENSIVE METABOLIC PANEL
ALT: 24 U/L (ref 0–44)
AST: 30 U/L (ref 15–41)
Albumin: 3.1 g/dL — ABNORMAL LOW (ref 3.5–5.0)
Alkaline Phosphatase: 134 U/L — ABNORMAL HIGH (ref 38–126)
Anion gap: 16 — ABNORMAL HIGH (ref 5–15)
BUN: 60 mg/dL — ABNORMAL HIGH (ref 8–23)
CO2: 19 mmol/L — ABNORMAL LOW (ref 22–32)
Calcium: 8.5 mg/dL — ABNORMAL LOW (ref 8.9–10.3)
Chloride: 99 mmol/L (ref 98–111)
Creatinine, Ser: 6.41 mg/dL — ABNORMAL HIGH (ref 0.61–1.24)
GFR, Estimated: 9 mL/min — ABNORMAL LOW (ref >60)
Glucose, Bld: 73 mg/dL (ref 70–99)
Potassium: 4.4 mmol/L (ref 3.5–5.1)
Sodium: 134 mmol/L — ABNORMAL LOW (ref 135–145)
Total Bilirubin: 1.7 mg/dL — ABNORMAL HIGH (ref 0.3–1.2)
Total Protein: 7.3 g/dL (ref 6.5–8.1)

## 2022-10-02 LAB — TROPONIN I (HIGH SENSITIVITY)
Troponin I (High Sensitivity): 192 ng/L (ref <18)
Troponin I (High Sensitivity): 242 ng/L (ref <18)

## 2022-10-02 LAB — RESP PANEL BY RT-PCR (RSV, FLU A&B, COVID)  RVPGX2
Influenza A by PCR: NEGATIVE
Influenza B by PCR: NEGATIVE
Resp Syncytial Virus by PCR: NEGATIVE
SARS Coronavirus 2 by RT PCR: NEGATIVE

## 2022-10-02 LAB — PROCALCITONIN: Procalcitonin: 0.26 ng/mL

## 2022-10-02 LAB — BRAIN NATRIURETIC PEPTIDE: B Natriuretic Peptide: 16.6 pg/mL (ref 0.0–100.0)

## 2022-10-02 MED ORDER — SEVELAMER CARBONATE 800 MG PO TABS
2400.0000 mg | ORAL_TABLET | Freq: Three times a day (TID) | ORAL | Status: DC
  Administered 2022-10-02 – 2022-10-05 (×6): 2400 mg via ORAL
  Filled 2022-10-02 (×7): qty 3

## 2022-10-02 MED ORDER — IPRATROPIUM-ALBUTEROL 0.5-2.5 (3) MG/3ML IN SOLN
3.0000 mL | Freq: Once | RESPIRATORY_TRACT | Status: AC
  Administered 2022-10-02: 3 mL via RESPIRATORY_TRACT
  Filled 2022-10-02: qty 3

## 2022-10-02 MED ORDER — DEXTROSE 50 % IV SOLN
1.0000 | Freq: Once | INTRAVENOUS | Status: AC
  Administered 2022-10-02: 50 mL via INTRAVENOUS
  Filled 2022-10-02: qty 50

## 2022-10-02 MED ORDER — PENTAFLUOROPROP-TETRAFLUOROETH EX AERO: 1.0000 | INHALATION_SPRAY | CUTANEOUS | Status: DC | PRN

## 2022-10-02 MED ORDER — CHLORHEXIDINE GLUCONATE CLOTH 2 % EX PADS
6.0000 | MEDICATED_PAD | Freq: Every day | CUTANEOUS | Status: DC
  Administered 2022-10-05: 6 via TOPICAL

## 2022-10-02 MED ORDER — HEPARIN SODIUM (PORCINE) 5000 UNIT/ML IJ SOLN
5000.0000 [IU] | Freq: Three times a day (TID) | INTRAMUSCULAR | Status: DC
  Administered 2022-10-03: 5000 [IU] via SUBCUTANEOUS
  Filled 2022-10-02 (×3): qty 1

## 2022-10-02 MED ORDER — ALBUTEROL SULFATE (2.5 MG/3ML) 0.083% IN NEBU: 2.5000 mg | INHALATION_SOLUTION | Freq: Four times a day (QID) | RESPIRATORY_TRACT | Status: DC | PRN

## 2022-10-02 MED ORDER — DULOXETINE HCL 20 MG PO CPEP
20.0000 mg | ORAL_CAPSULE | Freq: Every day | ORAL | Status: DC
  Administered 2022-10-02 – 2022-10-05 (×3): 20 mg via ORAL
  Filled 2022-10-02 (×4): qty 1

## 2022-10-02 MED ORDER — DEXTROSE 50 % IV SOLN: 1.0000 | INTRAVENOUS | Status: DC | PRN

## 2022-10-02 MED ORDER — CHLORHEXIDINE GLUCONATE CLOTH 2 % EX PADS: 6.0000 | MEDICATED_PAD | Freq: Every day | CUTANEOUS | Status: DC

## 2022-10-02 MED ORDER — ACETAMINOPHEN 325 MG PO TABS
650.0000 mg | ORAL_TABLET | Freq: Four times a day (QID) | ORAL | Status: DC | PRN
  Administered 2022-10-02 – 2022-10-04 (×2): 650 mg via ORAL
  Filled 2022-10-02 (×2): qty 2

## 2022-10-02 MED ORDER — DILTIAZEM HCL ER COATED BEADS 360 MG PO CP24
360.0000 mg | ORAL_CAPSULE | Freq: Once | ORAL | Status: DC
  Filled 2022-10-02: qty 1

## 2022-10-02 MED ORDER — HYDRALAZINE HCL 20 MG/ML IJ SOLN: 10.0000 mg | INTRAMUSCULAR | Status: DC | PRN

## 2022-10-02 MED ORDER — SODIUM CHLORIDE 0.9 % IV SOLN
2.0000 g | INTRAVENOUS | Status: DC
  Administered 2022-10-02: 2 g via INTRAVENOUS
  Filled 2022-10-02: qty 20

## 2022-10-02 MED ORDER — OXYCODONE HCL 5 MG PO TABS
5.0000 mg | ORAL_TABLET | Freq: Three times a day (TID) | ORAL | Status: DC | PRN
  Administered 2022-10-02 – 2022-10-05 (×7): 5 mg via ORAL
  Filled 2022-10-02 (×7): qty 1

## 2022-10-02 MED ORDER — PANTOPRAZOLE SODIUM 40 MG PO TBEC
40.0000 mg | DELAYED_RELEASE_TABLET | Freq: Two times a day (BID) | ORAL | Status: DC
  Administered 2022-10-02 – 2022-10-05 (×5): 40 mg via ORAL
  Filled 2022-10-02 (×5): qty 1

## 2022-10-02 MED ORDER — MONTELUKAST SODIUM 10 MG PO TABS
10.0000 mg | ORAL_TABLET | Freq: Every day | ORAL | Status: DC
  Administered 2022-10-02 – 2022-10-04 (×3): 10 mg via ORAL
  Filled 2022-10-02 (×3): qty 1

## 2022-10-02 MED ORDER — HYDRALAZINE HCL 50 MG PO TABS
50.0000 mg | ORAL_TABLET | Freq: Two times a day (BID) | ORAL | Status: DC
  Administered 2022-10-02: 50 mg via ORAL
  Filled 2022-10-02: qty 1

## 2022-10-02 MED ORDER — CARVEDILOL 3.125 MG PO TABS: 6.2500 mg | ORAL_TABLET | Freq: Once | ORAL | Status: DC

## 2022-10-02 MED ORDER — SODIUM CHLORIDE 0.9% FLUSH
3.0000 mL | Freq: Two times a day (BID) | INTRAVENOUS | Status: DC
  Administered 2022-10-03 – 2022-10-05 (×5): 3 mL via INTRAVENOUS

## 2022-10-02 MED ORDER — LIDOCAINE-PRILOCAINE 2.5-2.5 % EX CREA: 1.0000 | TOPICAL_CREAM | CUTANEOUS | Status: DC | PRN

## 2022-10-02 MED ORDER — DILTIAZEM HCL ER COATED BEADS 360 MG PO CP24
360.0000 mg | ORAL_CAPSULE | Freq: Every day | ORAL | Status: DC
  Filled 2022-10-02: qty 1

## 2022-10-02 MED ORDER — LIDOCAINE HCL (PF) 1 % IJ SOLN: 5.0000 mL | INTRAMUSCULAR | Status: DC | PRN

## 2022-10-02 MED ORDER — DOXERCALCIFEROL 4 MCG/2ML IV SOLN: 4.0000 ug | INTRAVENOUS | Status: DC

## 2022-10-02 MED ORDER — CARVEDILOL 3.125 MG PO TABS
6.2500 mg | ORAL_TABLET | Freq: Two times a day (BID) | ORAL | Status: DC
  Administered 2022-10-02 – 2022-10-03 (×2): 6.25 mg via ORAL
  Filled 2022-10-02: qty 2
  Filled 2022-10-02 (×2): qty 1

## 2022-10-02 MED ORDER — ACETAMINOPHEN 650 MG RE SUPP: 650.0000 mg | Freq: Four times a day (QID) | RECTAL | Status: DC | PRN

## 2022-10-02 MED ORDER — SODIUM CHLORIDE 0.9 % IV SOLN
500.0000 mg | INTRAVENOUS | Status: DC
  Administered 2022-10-02: 500 mg via INTRAVENOUS
  Filled 2022-10-02: qty 5

## 2022-10-02 NOTE — ED Notes (Signed)
Called dialysis at this time. Pt on the schedule for treatment.

## 2022-10-02 NOTE — ED Notes (Signed)
Dialysis called at this time. Spoke with them about pt. RN to call back.

## 2022-10-02 NOTE — ED Provider Triage Note (Signed)
  Emergency Medicine Provider Triage Evaluation Note  MRN:  [id number]  Arrival date & time: 10/02/22    Medically screening exam initiated at 3:42 AM.   CC:   SOB  HPI:  Bradrick Kamau is a 61 y.o. year-old male presents to the ED with chief complaint of SOB.  Hx of ESRD on HD T/Th/Sa.  Last HD on Saturday.  States he's been drinking more water than normal.  Now having worsening SOB and fatigue.  History provided by patient. ROS:  -As included in HPI PE:   Vitals:   10/02/22 0343  BP: (!) 187/130  Pulse: (!) 108  Resp: 15  Temp: 98.9 F (37.2 C)  SpO2: 98%   Chronically ill appearing Mildly increased WOB, +rhonchi  MDM:  Based on signs and symptoms, fluid overload is highest on my differential, followed by CAP. I've ordered labs and imaging in triage to expedite lab/diagnostic workup.  Patient was informed that the remainder of the evaluation will be completed by another provider, this initial triage assessment does not replace that evaluation, and the importance of remaining in the ED until their evaluation is complete.    Montine Circle, PA-C 10/02/22 [phone]

## 2022-10-02 NOTE — Progress Notes (Signed)
Pt received from ED via stretcher SOB o2 at [facility] stable for HD.

## 2022-10-02 NOTE — H&P (Addendum)
History and Physical    Patient: Timothy [name] XJO:[id number] DOB: July 02, 1961 DOA: 10/02/2022 DOS: the patient was seen and examined on 10/02/2022 PCP: [name], MD  Patient coming from: Home via EMS  Chief Complaint:  Chief Complaint  Patient presents with   Shortness of Breath    Hemodialysis   HPI: Timothy [name] is a 61 y.o. male with medical history significant of ESRD on HD (TTS, hypertension, polysubstance abuse, and GI bleed who presents with complaints of shortness of breath.  History is limited due to patient being on BiPAP and work of breathing.  Just recently hospitalized from 12/3-12/8 for acute respiratory failure after leaving hemodialysis related to the patient not abide by fluid restrictions.  During the hospitalization patient required intubation for urgent dialysis with improvement.  Found to be positive for RSV and treated for superimposed bacterial pneumonia with leukocytosis and elevated procalcitonin levels.  Patient reports dialyzing last 2 days ago. Review of records note patient was 8.5 kg over's estimated dry weight after leaving dialysis.  He reports that he has been short of breath ever since leaving dialysis.  Associated symptoms included back pain, abdominal distention, and abdominal pain.  Denies any fevers, chest pain, nausea, vomiting.  He reportedly had not been taking his blood pressure medications at home.  Patient notes that shortness of breath worsened till he had to call EMS.  In the emergency department patient was noted to be afebrile with heart rates elevated to 108,  respirations 15-34, blood pressures elevated up to 187/130, and O2 saturations currently maintained on BiPAP due to patient's work of breathing.  Labs significant for hemoglobin 10.5, potassium 4.4, CO2 19, BUN 60, creatinine 6.41, glucose 73, anion gap 16, and high-sensitivity troponin 192-> 242.  Influenza, RSV, and COVID-19 screening were negative.  Chest x-ray noted stable  cardiomegaly with Although platelike opacity in the right lower lung which was thought to be more likely atelectasis than pneumonia with no significant edema appreciated.  Nephrology has been consulted and placed orders for dialysis.   Review of Systems: unable to review all systems due to the inability of the patient to answer questions. Past Medical History:  Diagnosis Date   Acute metabolic encephalopathy 41/58/3094   Anemia of chronic kidney failure    BPH (benign prostatic hyperplasia)    Colon cancer [facility]) 2014   spouse states he had surgury for colon CA   End stage renal disease on dialysis [facility]) 2017   TTHSat   Hepatitis C    treated   Hypertension    Hypertensive crisis 04/07/2022   Hypertensive heart disease with chronic diastolic congestive heart failure ([place]) 07/17/2016   Polysubstance abuse ([place])    History of heroin and marijuana use   Thoracic aortic aneurysm ([place])    followed by Dr. Ellyn Hack   Past Surgical History:  Procedure Laterality Date   A/V FISTULAGRAM Left 12/02/2021   Procedure: A/V Fistulagram;  Surgeon: Cherre Robins, MD;  Location: [place] CV LAB;  Service: Cardiovascular;  Laterality: Left;   A/V FISTULAGRAM Left 04/24/2022   Procedure: A/V Fistulagram;  Surgeon: Waynetta Sandy, MD;  Location: [place] CV LAB;  Service: Cardiovascular;  Laterality: Left;   ABDOMINAL SURGERY     AV FISTULA PLACEMENT Left 09/19/2016   Procedure: Left arm Radiocephalic ARTERIOVENOUS (AV) FISTULA CREATION;  Surgeon: Conrad Wilbur, MD;  Location: [place];  Service: Vascular;  Laterality: Left;   [place] Left 07/09/2017   Procedure: BRACHIOCEPHALIC FISTULA CREATION;  Surgeon: Conrad Pomona, MD;  Location: [place];  Service: Vascular;  Laterality: Left;   BIOPSY  07/05/2022   Procedure: BIOPSY;  Surgeon: Thornton Park, MD;  Location: [place];  Service: Gastroenterology;;   COLON SURGERY  2014   COLONOSCOPY N/A 07/12/2022   Procedure:  COLONOSCOPY;  Surgeon: Doran Stabler, MD;  Location: [place];  Service: Gastroenterology;  Laterality: N/A;   COLONOSCOPY WITH PROPOFOL N/A 07/05/2022   Procedure: COLONOSCOPY WITH PROPOFOL;  Surgeon: Thornton Park, MD;  Location: [place];  Service: Gastroenterology;  Laterality: N/A;   ENTEROSCOPY N/A 07/28/2022   Procedure: ENTEROSCOPY;  Surgeon: [name], MD;  Location: [facility] ENDOSCOPY;  Service: Gastroenterology;  Laterality: N/A;   ESOPHAGOGASTRODUODENOSCOPY (EGD) WITH PROPOFOL N/A 11/05/2021   Procedure: ESOPHAGOGASTRODUODENOSCOPY (EGD) WITH PROPOFOL;  Surgeon: Carol Ada, MD;  Location: [place];  Service: Endoscopy;  Laterality: N/A;   ESOPHAGOGASTRODUODENOSCOPY (EGD) WITH PROPOFOL N/A 11/14/2021   Procedure: ESOPHAGOGASTRODUODENOSCOPY (EGD) WITH PROPOFOL;  Surgeon: [name], MD;  Location: [place];  Service: Gastroenterology;  Laterality: N/A;   ESOPHAGOGASTRODUODENOSCOPY (EGD) WITH PROPOFOL N/A 11/16/2021   Procedure: ESOPHAGOGASTRODUODENOSCOPY (EGD) WITH PROPOFOL;  Surgeon: [name], MD;  Location: [place];  Service: Gastroenterology;  Laterality: N/A;   ESOPHAGOGASTRODUODENOSCOPY (EGD) WITH PROPOFOL N/A 07/05/2022   Procedure: ESOPHAGOGASTRODUODENOSCOPY (EGD) WITH PROPOFOL;  Surgeon: Thornton Park, MD;  Location: [place];  Service: Gastroenterology;  Laterality: N/A;   ESOPHAGOGASTRODUODENOSCOPY (EGD) WITH PROPOFOL N/A 07/12/2022   Procedure: ESOPHAGOGASTRODUODENOSCOPY (EGD) WITH PROPOFOL;  Surgeon: Doran Stabler, MD;  Location: [place];  Service: Gastroenterology;  Laterality: N/A;   ESOPHAGOGASTRODUODENOSCOPY (EGD) WITH PROPOFOL N/A 07/31/2022   Procedure: ESOPHAGOGASTRODUODENOSCOPY (EGD) WITH PROPOFOL;  Surgeon: Ladene Artist, MD;  Location: [place];  Service: Gastroenterology;  Laterality: N/A;   ESOPHAGOGASTRODUODENOSCOPY (EGD) WITH PROPOFOL N/A 08/13/2022   Procedure: ESOPHAGOGASTRODUODENOSCOPY (EGD) WITH  PROPOFOL;  Surgeon: Gatha Mayer, MD;  Location: [place];  Service: Gastroenterology;  Laterality: N/A;   HEMOSTASIS CLIP PLACEMENT  11/16/2021   Procedure: HEMOSTASIS CLIP PLACEMENT;  Surgeon: [name], MD;  Location: [facility] ENDOSCOPY;  Service: Gastroenterology;;   HEMOSTASIS CLIP PLACEMENT  07/05/2022   Procedure: HEMOSTASIS CLIP PLACEMENT;  Surgeon: Thornton Park, MD;  Location: [facility] ENDOSCOPY;  Service: Gastroenterology;;   HEMOSTASIS CLIP PLACEMENT  08/13/2022   Procedure: HEMOSTASIS CLIP PLACEMENT;  Surgeon: Gatha Mayer, MD;  Location: [facility] ENDOSCOPY;  Service: Gastroenterology;;   HEMOSTASIS CONTROL  11/05/2021   Procedure: HEMOSTASIS CONTROL;  Surgeon: Carol Ada, MD;  Location: [place];  Service: Endoscopy;;   HEMOSTASIS CONTROL  11/16/2021   Procedure: HEMOSTASIS CONTROL;  Surgeon: [name], MD;  Location: [place];  Service: Gastroenterology;;   HEMOSTASIS CONTROL  07/05/2022   Procedure: HEMOSTASIS CONTROL;  Surgeon: Thornton Park, MD;  Location: [place];  Service: Gastroenterology;;   HOT HEMOSTASIS N/A 11/16/2021   Procedure: HOT HEMOSTASIS (ARGON PLASMA COAGULATION/BICAP);  Surgeon: [name], MD;  Location: [place];  Service: Gastroenterology;  Laterality: N/A;   HOT HEMOSTASIS N/A 08/13/2022   Procedure: HOT HEMOSTASIS (ARGON PLASMA COAGULATION/BICAP);  Surgeon: Gatha Mayer, MD;  Location: [place];  Service: Gastroenterology;  Laterality: N/A;   INSERTION OF DIALYSIS CATHETER N/A 09/19/2016   Procedure: INSERTION OF TUNNELED DIALYSIS CATHETER;  Surgeon: Conrad [place], MD;  Location: [place];  Service: Vascular;  Laterality: N/A;   IR [place]  11/16/2021   IR ANGIOGRAM VISCERAL SELECTIVE  11/05/2021   IR ANGIOGRAM  VISCERAL SELECTIVE  11/16/2021   IR ANGIOGRAM VISCERAL SELECTIVE  11/16/2021   IR EMBO ART  VEN HEMORR LYMPH EXTRAV  INC GUIDE ROADMAPPING  11/05/2021   IR EMBO ART  VEN HEMORR LYMPH EXTRAV   INC GUIDE ROADMAPPING  11/16/2021   IR GENERIC HISTORICAL  09/14/2016   IR US GUIDE VASC ACCESS RIGHT 09/14/2016 [name], DO [facility]-INTER[facility]   IR GENERIC HISTORICAL  09/14/2016   IR FLUORO GUIDE CV LINE RIGHT 09/14/2016 [name], DO [facility]   IR PARACENTESIS  06/22/2021   IR US GUIDE VASC ACCESS RIGHT  11/05/2021   IR US GUIDE VASC ACCESS RIGHT  11/16/2021   LAPAROTOMY N/A 05/23/2021   Procedure: EXPLORATORY LAPAROTOMY LYSIS ADHESIONS;  Surgeon: [name], MD;  Location: [place];  Service: General;  Laterality: N/A;   LAPAROTOMY N/A 11/20/2021   Procedure: EXPLORATORY LAPAROTOMY, REPAIR OF BLEEDING DUODENAL ULCER;  Surgeon: Dwan Bolt, MD;  Location: [place];  Service: General;  Laterality: N/A;   ORIF TIBIA PLATEAU Left 01/15/2018   Procedure: OPEN REDUCTION INTERNAL FIXATION (ORIF) TIBIAL PLATEAU;  Surgeon: Altamese Biggs, MD;  Location: [place];  Service: Orthopedics;  Laterality: Left;   REVISON OF ARTERIOVENOUS FISTULA Left 05/26/2022   Procedure: REVISION OF LEFT ARM ARTERIOVENOUS FISTULA WITH PLICATION OF PSEUDOANEURYSM;  Surgeon: Waynetta Sandy, MD;  Location: [place];  Service: Vascular;  Laterality: Left;   SCLEROTHERAPY  11/05/2021   Procedure: Clide Deutscher;  Surgeon: Carol Ada, MD;  Location: [place];  Service: Endoscopy;;   SCLEROTHERAPY  11/16/2021   Procedure: Clide Deutscher;  Surgeon: [name], MD;  Location: [facility] ENDOSCOPY;  Service: Gastroenterology;;   TRANSTHORACIC ECHOCARDIOGRAM  07/2016    EF 60-65%, No RWMA. Mod Concentric LVH - Gr 2 DD. Severe LA dilation. PAP ~35 mmHg (mild Pulm HTN)  --> no changes noted 1 month later   Social History:  reports that he quit smoking about 4 months ago. His smoking use included cigarettes. He smoked an average of .25 packs per day. He has never been exposed to tobacco smoke. He has never used smokeless tobacco. He reports current drug use. Frequency: 1.00 time per week. Drugs: Marijuana and Heroin. He reports  that he does not drink alcohol.  No Known Allergies  Family History  Problem Relation Age of Onset   Heart failure Mother        Died at age 42.   Heart attack Mother 58   Hypertension Mother    Diabetes Mellitus II Mother    Other Father        Unknown   Kidney failure Sister        (Oldest Sister)   Other Other        Multiple siblings have started her heart disease, he is not sure of the details.   CAD Nephew     Prior to Admission medications   Medication Sig Start Date End Date Taking? Authorizing Provider  albuterol (VENTOLIN HFA) 108 (90 Base) MCG/ACT inhaler Inhale 2 puffs into the lungs every 6 (six) hours as needed for wheezing or shortness of breath. 01/06/22   [provider]  carvedilol (COREG) 6.25 MG tablet Take 1 tablet (6.25 mg total) by mouth 2 (two) times daily with a meal. 09/22/22 10/22/22  Patrecia Pour, MD  diltiazem (CARDIZEM CD) 360 MG 24 hr capsule Take 1 capsule (360 mg total) by mouth daily. 08/03/22 09/19/22  Arrien, Jimmy Picket, MD  DULoxetine (CYMBALTA) 20 MG capsule Take 20  mg by mouth daily. Patient not taking: Reported on 09/19/2022 01/06/22   [provider]  hydrALAZINE (APRESOLINE) 50 MG tablet Take 1 tablet (50 mg total) by mouth 2 (two) times daily. 09/22/22 10/22/22  Patrecia Pour, MD  lidocaine (LMX) 4 % cream Apply 1 Application topically 3 (three) times a week. 06/27/22   [provider]  LIDOCAINE-PRILOCAINE EX Apply 1 application  topically Every Tuesday,Thursday,and Saturday with dialysis.    [provider]  montelukast (SINGULAIR) 10 MG tablet Take 10 mg by mouth at bedtime. Patient not taking: Reported on 09/19/2022 08/24/22   [provider]  oxyCODONE (OXY IR/ROXICODONE) 5 MG immediate release tablet Take 5 mg by mouth 3 (three) times daily as needed for moderate pain or severe pain. 08/09/22   [provider]  pantoprazole (PROTONIX) 40 MG tablet Take 1 tablet (40 mg total) by mouth 2  (two) times daily before a meal. 08/14/22   Lavina Hamman, MD  sevelamer carbonate (RENVELA) 800 MG tablet Take 1,600 mg by mouth 4 (four) times daily. 09/15/22   [provider]    Physical Exam: Vitals:   10/02/22 0845 10/02/22 0900 10/02/22 0915 10/02/22 0937  BP: (!) 161/125 (!) 172/127 (!) 179/129   Pulse: (!) 106 (!) 103 (!) 104 99  Resp: (!) 28 18 (!) 34 (!) 33  Temp:      SpO2: 98% 96% 97% 98%    Constitutional: Thin adult male who appears in distress sitting up on BiPAP. Eyes: PERRL, lids and conjunctivae normal ENMT: Mucous membranes are moist. Posterior pharynx clear of any exudate or lesions.Normal dentition.  Neck: normal, supple, JVD present. Respiratory: Tachypneic with rales appreciated in right lower lung field.  Talking in shortened 1-2 word sentences while on BiPAP currently. Cardiovascular: Tachycardic.  No extremity edema. Left upper extremity AV fistula present with palpable thrill Abdomen: Abdomen minges mildly distended Musculoskeletal: no clubbing / cyanosis. No joint deformity upper and lower extremities. Good ROM, no contractures. Normal muscle tone.  Skin: Dry flaky skin noted of the bilateral lower extremities. Neurologic: CN 2-12 grossly intact.  Able to move all extremities. Psychiatric: Alert and oriented x 3. Normal mood.   Data Reviewed:  EKG reveals sinus tachycardia at 106 bpm with premature supraventricular complexes nonspecific ST wave changes.  Assessment and Plan: Acute respiratory distress secondary to ESRD  possible healthcare associated pneumonia Patient presents with complaints of shortness of breath with tachypnea.  Patient was placed on BiPAP due to work of breathing.  On physical exam patient with Rales noted in right lower lung field.  Chest x-ray had noted platelike opacity of the right opacity in the right lower lung which was thought to be more likely atelectasis than pneumonia and did not note any signs of pulmonary edema.   Reported to be over his dry weight after dialysis 2 days ago.  On physical exam patient did however appear volume overloaded.  Influenza, COVID-19, and RSV were negative.  Patient had just been hospitalized at the beginning of this  on 12/4 with RSV and concern for a superimposed bacterial pneumonia.  He has been consulted for hemodialysis. -Admit to a progressive bed -Continuous pulse oximetry with oxygen maintain O2 saturation greater than 92% -Continue BiPAP.  Wean when medically appropriate. -Advance diet as tolerated when taken off of BiPAP. -Check procalcitonin(0.26) -Rocephin and azithromycin  IV.  De-escalate when medically appropriate -Albuterol nebs as needed for shortness of breath/wheezing -Continue to educate patient on the need of  fluid restriction and dietary restriction -Appreciate nephrology consultative services who placed orders for patient to be dialyzed today  Hypertensive urgency Blood pressures initially elevated up to 187/130.  Patient reportedly had not been taking home medications.   -Resume Coreg, Cardizem, and hydralazine -Hydralazine IV as needed for elevated blood pressure -Continue to encourage patient to take medications as prescribed.  Elevated troponin Acute.  High-sensitivity troponins are 192-> 242.  Patient did not report any complaints of chest pain. EKG did not note any significant ischemic changes.  Suspect secondary to demand in setting of acute respiratory distress as well as hypertensive urgency. -Continue to monitor  Hypoglycemia Acute.  On admission glucose noted to be as low as 61.  Patient was given amp of D50.  He is not on any oral hypoglycemic agents. -Hypoglycemic protocols -CBGs q. 4 hours x 4 -Amp of D50 as needed for low blood sugars  Anemia of chronic kidney disease Stable.  Hemoglobin 10.5 which appears around patient's baseline. -Continue to monitor  Anxiety and depression -Continue Cymbalta  DVT prophylaxis: heparin Advance  Care Planning:   Code Status: Full Code    Consults: Nephrology  Family Communication: None  Severity of Illness: The appropriate patient status for this patient is INPATIENT. Inpatient status is judged to be reasonable and necessary in order to provide the required intensity of service to ensure the patient's safety. The patient's presenting symptoms, physical exam findings, and initial radiographic and laboratory data in the context of their chronic comorbidities is felt to place them at high risk for further clinical deterioration. Furthermore, it is not anticipated that the patient will be medically stable for discharge from the hospital within 2 midnights of admission.   * I certify that at the point of admission it is my clinical judgment that the patient will require inpatient hospital care spanning beyond 2 midnights from the point of admission due to high intensity of service, high risk for further deterioration and high frequency of surveillance required.*  Author: Norval Morton, MD 10/02/2022 10:05 AM  For on call review [email or web address].

## 2022-10-02 NOTE — Progress Notes (Signed)
Pt transported back to ED via stretcher pt d/cd tx 1hr early by [place] LPN

## 2022-10-02 NOTE — ED Triage Notes (Signed)
Patient arrived with EMS from home , hemodialysis treatment last Saturday , reports SOB with abdominal distention this evening , BP= 190/100 by EMS .

## 2022-10-02 NOTE — ED Notes (Signed)
Pt is now refusing IV antibiotics states that it is enough. Resp at bedside to attempt to place pt on bipap and pt is now stating that he has to have another bm. Pt ambulated to restroom without assistance. Spoke to admit provider made aware of pt refusing the monitor and antibiotics. Admit provider advised they would come see the pt. Pt is having audible congestion, resp accessory muscle usage, labored breathing, work of breathing.

## 2022-10-02 NOTE — ED Notes (Signed)
Pt is outside of room standing at door asking for medication and help advised would get to it as soon as I was able to do so. Charge nurse also spoke with pt at this time reference same. Contacted pharmacy to request medication to be approved. Pt then entered the room at this time

## 2022-10-02 NOTE — ED Notes (Signed)
Received verbal report from Patti F RN at this time 

## 2022-10-02 NOTE — ED Notes (Signed)
Call bell to bathroom going off. Went to the bathroom and found pt on the floor. Per pt he fell to the ground. Attempted to get pt up and pt stated that he couldn't get up. Located a wheelchair. Admit provider Dr. Claria Dice at side while pt was on the floor. This RN with NT and resp therapy tech assisted pt to wheelchair and back to bed. At this time pt denies any injury or LOC

## 2022-10-02 NOTE — ED Notes (Signed)
Pt refuses to be placed on the monitor. Refuses to let me adjust his sheets and straighten up his bed, refuses to be placed in a gown at this time

## 2022-10-02 NOTE — Progress Notes (Signed)
Received patient in bed to unit.  Alert and oriented.  Informed consent signed and in chart.   Treatment initiated: 8346 Treatment completed: 1819  Patient tolerated well.  Transported back to the room  Alert, without acute distress.  Hand-off given to patient's nurse.   Access used: Fistual Access issues: none  Total UF removed: 3000 Medication(s) given: none Post HD VS: [id number]), HR-98, RR-20, SP02-94 Post HD weight: 79.9kg  Pt tolerated dialysis condition wise. He ended treatment an hour early complaining of cramping. Early termination form signed.   [facility]

## 2022-10-02 NOTE — ED Provider Notes (Signed)
Timothy [name] Provider Note   CSN: [id number] Arrival date & time: 10/02/22  4742     History Chief Complaint  Patient presents with   Shortness of Breath    Hemodialysis    Timothy [name] is a 61 y.o. male hypertension, end-stage renal disease on dialysis Monday Wednesday Friday, CHF, substance abuse presents the emergency room today for evaluation of shortness of breath since yesterday.  Patient reports that he sat through a full dialysis session on Saturday.  He reports that he thinks he was chewing too much ice consuming too much fluids afterwards.  He did does not make any urine.  He reports some chest pain but mainly reports some shortness of breath, worse with exertion.  He reports that he feels like he is more swollen than usual.  Denies any nausea, vomiting, abdominal pain, fevers, cough.   Shortness of Breath Associated symptoms: chest pain   Associated symptoms: no abdominal pain, no cough, no fever and no vomiting        Home Medications Prior to Admission medications   Medication Sig Start Date End Date Taking? Authorizing Provider  albuterol (VENTOLIN HFA) 108 (90 Base) MCG/ACT inhaler Inhale 2 puffs into the lungs every 6 (six) hours as needed for wheezing or shortness of breath. 01/06/22   [provider]  carvedilol (COREG) 6.25 MG tablet Take 1 tablet (6.25 mg total) by mouth 2 (two) times daily with a meal. 09/22/22 10/22/22  Patrecia Pour, MD  diltiazem (CARDIZEM CD) 360 MG 24 hr capsule Take 1 capsule (360 mg total) by mouth daily. 08/03/22 09/19/22  Arrien, Jimmy Picket, MD  DULoxetine (CYMBALTA) 20 MG capsule Take 20 mg by mouth daily. Patient not taking: Reported on 09/19/2022 01/06/22   [provider]  hydrALAZINE (APRESOLINE) 50 MG tablet Take 1 tablet (50 mg total) by mouth 2 (two) times daily. 09/22/22 10/22/22  Patrecia Pour, MD  lidocaine (LMX) 4 % cream Apply 1 Application topically 3 (three) times a  week. 06/27/22   [provider]  LIDOCAINE-PRILOCAINE EX Apply 1 application  topically Every Tuesday,Thursday,and Saturday with dialysis.    [provider]  montelukast (SINGULAIR) 10 MG tablet Take 10 mg by mouth at bedtime. Patient not taking: Reported on 09/19/2022 08/24/22   [provider]  oxyCODONE (OXY IR/ROXICODONE) 5 MG immediate release tablet Take 5 mg by mouth 3 (three) times daily as needed for moderate pain or severe pain. 08/09/22   [provider]  pantoprazole (PROTONIX) 40 MG tablet Take 1 tablet (40 mg total) by mouth 2 (two) times daily before a meal. 08/14/22   Lavina Hamman, MD  sevelamer carbonate (RENVELA) 800 MG tablet Take 1,600 mg by mouth 4 (four) times daily. 09/15/22   [provider]      Allergies    Patient has no known allergies.    Review of Systems   Review of Systems  Constitutional:  Negative for chills and fever.  Respiratory:  Positive for shortness of breath. Negative for cough.   Cardiovascular:  Positive for chest pain and leg swelling.  Gastrointestinal:  Negative for abdominal pain, nausea and vomiting.    Physical Exam Updated Vital Signs BP (!) 167/129   Pulse 99   Temp 99.3 F (37.4 C)   Resp (!) 27   SpO2 100%  Physical Exam Vitals and nursing note reviewed.  Constitutional:      Appearance: He is ill-appearing.  HENT:  Mouth/Throat:     Comments: Poor dentition Cardiovascular:     Rate and Rhythm: Regular rhythm. Tachycardia present.  Pulmonary:     Effort: Tachypnea present.     Breath sounds: Examination of the right-middle field reveals rales. Examination of the left-middle field reveals rales. Examination of the right-lower field reveals rales. Examination of the left-lower field reveals rales. Rales present.     Comments: Mild tachypnea present.  Patient is still speaking in full sentences.  He was placed on nasal cannula up in triage.  He reports he is short of breath.   Patient keeps removing his nasal cannula and his pulse oximetry. Musculoskeletal:     Right lower leg: No tenderness. No edema.     Left lower leg: No tenderness. No edema.  Skin:    General: Skin is warm and dry.  Neurological:     Mental Status: He is alert.     ED Results / Procedures / Treatments   Labs (all labs ordered are listed, but only abnormal results are displayed) Labs Reviewed  COMPREHENSIVE METABOLIC PANEL - Abnormal; Notable for the following components:      Result Value   Sodium 134 (*)    CO2 19 (*)    BUN 60 (*)    Creatinine, Ser 6.41 (*)    Calcium 8.5 (*)    Albumin 3.1 (*)    Alkaline Phosphatase 134 (*)    Total Bilirubin 1.7 (*)    GFR, Estimated 9 (*)    Anion gap 16 (*)    All other components within normal limits  CBC - Abnormal; Notable for the following components:   RBC 3.67 (*)    Hemoglobin 10.5 (*)    HCT 32.1 (*)    RDW 17.3 (*)    All other components within normal limits  CBG MONITORING, ED - Abnormal; Notable for the following components:   Glucose-Capillary 61 (*)    All other components within normal limits  TROPONIN I (HIGH SENSITIVITY) - Abnormal; Notable for the following components:   Troponin I (High Sensitivity) 192 (*)    All other components within normal limits  TROPONIN I (HIGH SENSITIVITY) - Abnormal; Notable for the following components:   Troponin I (High Sensitivity) 242 (*)    All other components within normal limits  RESP PANEL BY RT-PCR (RSV, FLU A&B, COVID)  RVPGX2  BRAIN NATRIURETIC PEPTIDE  PROCALCITONIN  HEPATITIS B SURFACE ANTIGEN  HEPATITIS B SURFACE ANTIBODY, QUANTITATIVE    EKG EKG Interpretation  Date/Time:  Monday October 02 2022 03:32:12 EST Ventricular Rate:  106 PR Interval:  188 QRS Duration: 74 QT Interval:  330 QTC Calculation: 438 R Axis:   26 Text Interpretation: Sinus tachycardia with Premature supraventricular complexes Nonspecific ST and T wave abnormality Abnormal ECG When  compared with ECG of 18-Sep-2022 00:09, No significant change was found Confirmed by Delora Fuel ([id number]) on 10/02/2022 4:14:45 AM  Radiology DG Chest Port 1 View  Result Date: 10/02/2022 CLINICAL DATA:  61 year old male with shortness of breath, fatigue, abdominal distension. Hepatitis. EXAM: PORTABLE CHEST 1 VIEW COMPARISON:  Portable chest 09/18/2022 and earlier. FINDINGS: Portable AP upright view at 0353 hours. Extubated and enteric tube removed. Stable cardiomegaly and mediastinal contours. Stable lung volumes. Patchy and platelike opacity persists in the right lower lung. No superimposed pneumothorax, pulmonary edema or pleural effusion. Stable ventilation elsewhere. Paucity of bowel gas in the visible abdomen. No acute osseous abnormality identified. IMPRESSION: 1. Extubated and enteric tube removed. 2.  Stable cardiomegaly, and confluent although plate-like opacity in the right lower lung which more resembles atelectasis than pneumonia. No edema or effusion is evident. Electronically Signed   By: Genevie Ann M.D.   On: 10/02/2022 04:17    Procedures .Critical Care  Performed by: Sherrell Puller, PA-C Authorized by: Sherrell Puller, PA-C   Critical care provider statement:    Critical care time (minutes):  45   Critical care was necessary to treat or prevent imminent or life-threatening deterioration of the following conditions:  Respiratory failure (BiPAP)   Critical care was time spent personally by me on the following activities:  Development of treatment plan with patient or surrogate, discussions with consultants, evaluation of patient's response to treatment, examination of patient, ordering and review of laboratory studies, ordering and review of radiographic studies, ordering and performing treatments and interventions, pulse oximetry, re-evaluation of patient's condition and review of old charts   Care discussed with: admitting provider      Medications Ordered in ED Medications   Chlorhexidine Gluconate Cloth 2 % PADS 6 each (has no administration in time range)  sevelamer carbonate (RENVELA) tablet 2,400 mg (has no administration in time range)  doxercalciferol (HECTOROL) injection 4 mcg (has no administration in time range)  Chlorhexidine Gluconate Cloth 2 % PADS 6 each (has no administration in time range)  pentafluoroprop-tetrafluoroeth (GEBAUERS) aerosol 1 Application (has no administration in time range)  lidocaine (PF) (XYLOCAINE) 1 % injection 5 mL (has no administration in time range)  lidocaine-prilocaine (EMLA) cream 1 Application (has no administration in time range)  carvedilol (COREG) tablet 6.25 mg (6.25 mg Oral Not Given 10/02/22 0930)  diltiazem (CARDIZEM CD) 24 hr capsule 360 mg (360 mg Oral Not Given 10/02/22 0958)  heparin injection 5,000 Units (has no administration in time range)  sodium chloride flush (NS) 0.9 % injection 3 mL (has no administration in time range)  acetaminophen (TYLENOL) tablet 650 mg (has no administration in time range)    Or  acetaminophen (TYLENOL) suppository 650 mg (has no administration in time range)  albuterol (PROVENTIL) (2.5 MG/3ML) 0.083% nebulizer solution 2.5 mg (has no administration in time range)  carvedilol (COREG) tablet 6.25 mg (has no administration in time range)  diltiazem (CARDIZEM CD) 24 hr capsule 360 mg (has no administration in time range)  hydrALAZINE (APRESOLINE) tablet 50 mg (has no administration in time range)  DULoxetine (CYMBALTA) DR capsule 20 mg (has no administration in time range)  oxyCODONE (Oxy IR/ROXICODONE) immediate release tablet 5 mg (has no administration in time range)  pantoprazole (PROTONIX) EC tablet 40 mg (has no administration in time range)  montelukast (SINGULAIR) tablet 10 mg (has no administration in time range)  cefTRIAXone (ROCEPHIN) 2 g in sodium chloride 0.9 % 100 mL IVPB (has no administration in time range)  azithromycin (ZITHROMAX) 500 mg in sodium chloride 0.9 %  250 mL IVPB (has no administration in time range)  hydrALAZINE (APRESOLINE) injection 10 mg (has no administration in time range)  dextrose 50 % solution 50 mL (has no administration in time range)  ipratropium-albuterol (DUONEB) 0.5-2.5 (3) MG/3ML nebulizer solution 3 mL (3 mLs Nebulization Given 10/02/22 0348)  dextrose 50 % solution 50 mL (50 mLs Intravenous Given 10/02/22 1155)    ED Course/ Medical Decision Making/ A&P                           Medical Decision Making Risk Prescription drug management. Decision regarding hospitalization.  61 year old male presents the emergency room today for evaluation of shortness of breath with some chest pain.  Differential diagnosis includes was limited to hypervolemia, ACS, arrhythmia, PE, pneumonia, pneumothorax, dissection, aneurysm, anxiety, costochondritis.  Vital signs show slightly elevated blood pressure at 171/125, afebrile, normal pulse rate, tachypneic, satting 100% on 3 L of nasal cannula.  Lab and imaging workup initiated in triage.  I independently reviewed and interpreted the patient's labs.  Sodium is 134, bicarb 19, creatinine is elevated at 6.41 although appears to be around patient's baseline.  He is mildly hypocalcemic at 8.5.  LFTs appear to be in normal range per patient.  Patient's hemoglobin is 10.5 which is around his baseline.  No leukocytosis seen.  Initial troponin 192 with repeat of 242, likely due to demand given the patient's tachypnea and tachycardia.  EKG reviewed and interpreted by an attending and shows sinus tachycardia with premature supraventricular complexes.  Nonseptic T and ST abnormality.  No segment changes found since prior EKG.  Patient's x-ray was compared to his previous 1 when he was intubated while in the hospital.  It is read as extubated and enteric tube removed.  Stable cardiomegaly and confluent although platelike opacity is in the right lower lung which will resembles atelectasis and pneumonia.   No edema or effusion is evident.  Patient is noncompliant with his blood pressure medications.  Blood pressure medications ordered.  I spoke with Anice Paganini, PA-C.  She reports the patient will be sent to dialysis today.  Patient refuses to leave on his nasal cannula.  He is becoming more tachypneic.  At this time, patient will need to be placed on BiPAP and will be admitted.  My attending spoke with the hospitalist who accepts admission.  Final Clinical Impression(s) / ED Diagnoses Final diagnoses:  Other hypervolemia  Acute respiratory failure with hypoxia [facility])    Rx / DC Orders ED Discharge Orders     None         Sherrell Puller, PA-C 10/02/22 1632    Margette Fast, MD 10/08/22 [phone]

## 2022-10-02 NOTE — Procedures (Signed)
I was present at this dialysis session. I have reviewed the session itself and made appropriate changes.   Vital signs in last 24 hours:  Temp:  [98 F (36.7 C)-99.3 F (37.4 C)] 99.3 F (37.4 C) (12/18 1444) Pulse Rate:  [92-108] 97 (12/18 1444) Resp:  [13-34] 23 (12/18 1444) BP: (161-187)/(121-134) 174/131 (12/18 1444) SpO2:  [94 %-100 %] 97 % (12/18 1444) FiO2 (%):  [4 %-40 %] 4 % (12/18 1444) Weight change:  There were no vitals filed for this visit.  Recent Labs  Lab 10/02/22 0346  NA 134*  K 4.4  CL 99  CO2 19*  GLUCOSE 73  BUN 60*  CREATININE 6.41*  CALCIUM 8.5*    Recent Labs  Lab 10/02/22 0346  WBC 7.6  HGB 10.5*  HCT 32.1*  MCV 87.5  PLT 177    Scheduled Meds:  carvedilol  6.25 mg Oral Once   Chlorhexidine Gluconate Cloth  6 each Topical Q0600   Chlorhexidine Gluconate Cloth  6 each Topical Q0600   diltiazem  360 mg Oral Once   [START ON 10/03/2022] doxercalciferol  4 mcg Intravenous Q T,Th,Sa-HD   heparin  5,000 Units Subcutaneous Q8H   sevelamer carbonate  2,400 mg Oral TID WC   sodium chloride flush  3 mL Intravenous Q12H   Continuous Infusions: PRN Meds:.acetaminophen **OR** acetaminophen, albuterol   Donetta Potts,  MD 10/02/2022, 2:56 PM

## 2022-10-02 NOTE — ED Notes (Signed)
Resp at bedside at this time 

## 2022-10-02 NOTE — ED Notes (Signed)
Dr. Claria Dice remains at bedside. Pt is now on bipap resting quietly. Pt was placed on monitor at this time.

## 2022-10-02 NOTE — Consult Note (Addendum)
[facility] Renal Consultation Note    Indication for Consultation:  Management of ESRD/hemodialysis, anemia, hypertension/volume, and secondary hyperparathyroidism.  HPI: Timothy [name] is a 61 y.o. male. PMH includes ESRD on dialysis, HTN, polysubstance abuse, and recent admission for pulmonary edema/RSV pneumonia, who presented to the ED today with shortness of breath. Also reports fatigue and abdominal distention. Patient reports he has been drinking a lot of water and chewing ice. His BP medications were titrated up during his recent admission but he admits he has not been taking his antihypertensive medications. He went to dialysis on Saturday but left HD 8.5kg over his EDW (weight has been steadily increasing since his discharge). He reports he suddenly became short of breath with edema in his legs overnight. He denies chest pain, palpitations, headache, dizziness, fever, chills, nausea, and vomiting. He reports abdominal pain and states he takes pain medication at home. Labs in the ED notable for K+ 4.4, BUN 60, Cr 6.41, Ca 8.5, Alb 3.1, BNP 16.6, WBC 7.6, Hgb 10.5. Respiratory panel negative. CXR with "confluent plate like opacity" in the right lower lobe, favoring atelectasis. BP elevated and O2 sat 100% on [place] at time of my exam.  Past Medical History:  Diagnosis Date   Acute metabolic encephalopathy 01/75/1025   Anemia of chronic kidney failure    BPH (benign prostatic hyperplasia)    Colon cancer [facility]) 2014   spouse states he had surgury for colon CA   End stage renal disease on dialysis [facility]) 2017   TTHSat   Hepatitis C    treated   Hypertension    Hypertensive crisis 04/07/2022   Hypertensive heart disease with chronic diastolic congestive heart failure ([place]) 07/17/2016   Polysubstance abuse ([place])    History of heroin and marijuana use   Thoracic aortic aneurysm ([place])    followed by Dr. Ellyn Hack   Past Surgical History:  Procedure Laterality Date   A/V  FISTULAGRAM Left 12/02/2021   Procedure: A/V Fistulagram;  Surgeon: Cherre Robins, MD;  Location: [place] CV LAB;  Service: Cardiovascular;  Laterality: Left;   A/V FISTULAGRAM Left 04/24/2022   Procedure: A/V Fistulagram;  Surgeon: Waynetta Sandy, MD;  Location: [place] CV [facility];  Service: Cardiovascular;  Laterality: Left;   ABDOMINAL SURGERY     AV FISTULA PLACEMENT Left 09/19/2016   Procedure: Left arm Radiocephalic ARTERIOVENOUS (AV) FISTULA CREATION;  Surgeon: Conrad Baskerville, MD;  Location: [place];  Service: Vascular;  Laterality: Left;   BASCILIC VEIN TRANSPOSITION Left 07/09/2017   Procedure: BRACHIOCEPHALIC FISTULA CREATION;  Surgeon: Conrad [place], MD;  Location: [place];  Service: Vascular;  Laterality: Left;   BIOPSY  07/05/2022   Procedure: BIOPSY;  Surgeon: Thornton Park, MD;  Location: [place];  Service: Gastroenterology;;   COLON SURGERY  2014   COLONOSCOPY N/A 07/12/2022   Procedure: COLONOSCOPY;  Surgeon: Doran Stabler, MD;  Location: [place];  Service: Gastroenterology;  Laterality: N/A;   COLONOSCOPY WITH PROPOFOL N/A 07/05/2022   Procedure: COLONOSCOPY WITH PROPOFOL;  Surgeon: Thornton Park, MD;  Location: [place];  Service: Gastroenterology;  Laterality: N/A;   ENTEROSCOPY N/A 07/28/2022   Procedure: ENTEROSCOPY;  Surgeon: [name], MD;  Location: [facility] ENDOSCOPY;  Service: Gastroenterology;  Laterality: N/A;   ESOPHAGOGASTRODUODENOSCOPY (EGD) WITH PROPOFOL N/A 11/05/2021   Procedure: ESOPHAGOGASTRODUODENOSCOPY (EGD) WITH PROPOFOL;  Surgeon: Carol Ada, MD;  Location: [place];  Service: Endoscopy;  Laterality: N/A;   ESOPHAGOGASTRODUODENOSCOPY (EGD) WITH PROPOFOL N/A 11/14/2021  Procedure: ESOPHAGOGASTRODUODENOSCOPY (EGD) WITH PROPOFOL;  Surgeon: [name], MD;  Location: [place];  Service: Gastroenterology;  Laterality: N/A;   ESOPHAGOGASTRODUODENOSCOPY (EGD) WITH PROPOFOL N/A 11/16/2021   Procedure:  ESOPHAGOGASTRODUODENOSCOPY (EGD) WITH PROPOFOL;  Surgeon: [name], MD;  Location: [place];  Service: Gastroenterology;  Laterality: N/A;   ESOPHAGOGASTRODUODENOSCOPY (EGD) WITH PROPOFOL N/A 07/05/2022   Procedure: ESOPHAGOGASTRODUODENOSCOPY (EGD) WITH PROPOFOL;  Surgeon: Thornton Park, MD;  Location: [place];  Service: Gastroenterology;  Laterality: N/A;   ESOPHAGOGASTRODUODENOSCOPY (EGD) WITH PROPOFOL N/A 07/12/2022   Procedure: ESOPHAGOGASTRODUODENOSCOPY (EGD) WITH PROPOFOL;  Surgeon: Doran Stabler, MD;  Location: [place];  Service: Gastroenterology;  Laterality: N/A;   ESOPHAGOGASTRODUODENOSCOPY (EGD) WITH PROPOFOL N/A 07/31/2022   Procedure: ESOPHAGOGASTRODUODENOSCOPY (EGD) WITH PROPOFOL;  Surgeon: Ladene Artist, MD;  Location: [place];  Service: Gastroenterology;  Laterality: N/A;   ESOPHAGOGASTRODUODENOSCOPY (EGD) WITH PROPOFOL N/A 08/13/2022   Procedure: ESOPHAGOGASTRODUODENOSCOPY (EGD) WITH PROPOFOL;  Surgeon: Gatha Mayer, MD;  Location: [place];  Service: Gastroenterology;  Laterality: N/A;   HEMOSTASIS CLIP PLACEMENT  11/16/2021   Procedure: HEMOSTASIS CLIP PLACEMENT;  Surgeon: [name], MD;  Location: [facility] ENDOSCOPY;  Service: Gastroenterology;;   HEMOSTASIS CLIP PLACEMENT  07/05/2022   Procedure: HEMOSTASIS CLIP PLACEMENT;  Surgeon: Thornton Park, MD;  Location: [facility] ENDOSCOPY;  Service: Gastroenterology;;   HEMOSTASIS CLIP PLACEMENT  08/13/2022   Procedure: HEMOSTASIS CLIP PLACEMENT;  Surgeon: Gatha Mayer, MD;  Location: [facility] ENDOSCOPY;  Service: Gastroenterology;;   HEMOSTASIS CONTROL  11/05/2021   Procedure: HEMOSTASIS CONTROL;  Surgeon: Carol Ada, MD;  Location: [place];  Service: Endoscopy;;   HEMOSTASIS CONTROL  11/16/2021   Procedure: HEMOSTASIS CONTROL;  Surgeon: [name], MD;  Location: [place];  Service: Gastroenterology;;   HEMOSTASIS CONTROL  07/05/2022   Procedure: HEMOSTASIS CONTROL;  Surgeon: Thornton Park, MD;  Location: [place];  Service: Gastroenterology;;   HOT HEMOSTASIS N/A 11/16/2021   Procedure: HOT HEMOSTASIS (ARGON PLASMA COAGULATION/BICAP);  Surgeon: [name], MD;  Location: [place];  Service: Gastroenterology;  Laterality: N/A;   HOT HEMOSTASIS N/A 08/13/2022   Procedure: HOT HEMOSTASIS (ARGON PLASMA COAGULATION/BICAP);  Surgeon: Gatha Mayer, MD;  Location: [place];  Service: Gastroenterology;  Laterality: N/A;   INSERTION OF DIALYSIS CATHETER N/A 09/19/2016   Procedure: INSERTION OF TUNNELED DIALYSIS CATHETER;  Surgeon: Conrad [place], MD;  Location: [place];  Service: Vascular;  Laterality: N/A;   IR [place]  11/16/2021   IR ANGIOGRAM VISCERAL SELECTIVE  11/05/2021   IR ANGIOGRAM VISCERAL SELECTIVE  11/16/2021   IR ANGIOGRAM VISCERAL SELECTIVE  11/16/2021   IR EMBO ART  VEN HEMORR LYMPH EXTRAV  INC GUIDE ROADMAPPING  11/05/2021   IR EMBO ART  VEN HEMORR LYMPH EXTRAV  INC GUIDE ROADMAPPING  11/16/2021   IR GENERIC HISTORICAL  09/14/2016   IR US GUIDE VASC ACCESS RIGHT 09/14/2016 [name], DO [facility]   IR GENERIC HISTORICAL  09/14/2016   IR FLUORO GUIDE CV LINE RIGHT 09/14/2016 [name], DO [facility]   IR PARACENTESIS  06/22/2021   IR US GUIDE [place] RIGHT  11/05/2021   IR US GUIDE [place] RIGHT  11/16/2021   LAPAROTOMY N/A 05/23/2021   Procedure: EXPLORATORY LAPAROTOMY LYSIS ADHESIONS;  Surgeon: [name], MD;  Location: [place];  Service: General;  Laterality: N/A;   LAPAROTOMY N/A 11/20/2021   Procedure: EXPLORATORY LAPAROTOMY, REPAIR OF BLEEDING DUODENAL ULCER;  Surgeon: Dwan Bolt, MD;  [facility]: [facility];  Service: General;  Laterality: N/A;   ORIF TIBIA PLATEAU Left 01/15/2018   Procedure: OPEN REDUCTION INTERNAL FIXATION (ORIF) TIBIAL PLATEAU;  Surgeon: Altamese Bradford, MD;  Location: [place];  Service: Orthopedics;  Laterality: Left;   REVISON OF ARTERIOVENOUS FISTULA Left 05/26/2022   Procedure:  REVISION OF LEFT ARM ARTERIOVENOUS FISTULA WITH PLICATION OF PSEUDOANEURYSM;  Surgeon: Waynetta Sandy, MD;  Location: [place];  Service: Vascular;  Laterality: Left;   SCLEROTHERAPY  11/05/2021   Procedure: Clide Deutscher;  Surgeon: Carol Ada, MD;  Location: [place];  Service: Endoscopy;;   SCLEROTHERAPY  11/16/2021   Procedure: Clide Deutscher;  Surgeon: [name], MD;  Location: [facility] ENDOSCOPY;  Service: Gastroenterology;;   TRANSTHORACIC ECHOCARDIOGRAM  07/2016    EF 60-65%, No RWMA. Mod Concentric LVH - Gr 2 DD. Severe LA dilation. PAP ~35 mmHg (mild Pulm HTN)  --> no changes noted 1 month later   Family History  Problem Relation Age of Onset   Heart failure Mother        Died at age 49.   Heart attack Mother 14   Hypertension Mother    Diabetes Mellitus II Mother    Other Father        Unknown   Kidney failure Sister        (Oldest Sister)   Other Other        Multiple siblings have started her heart disease, he is not sure of the details.   CAD Nephew    Social History:  reports that he quit smoking about 4 months ago. His smoking use included cigarettes. He smoked an average of .25 packs per day. He has never been exposed to tobacco smoke. He has never used smokeless tobacco. He reports current drug use. Frequency: 1.00 time per week. Drugs: Marijuana and Heroin. He reports that he does not drink alcohol.  ROS: As per HPI otherwise negative.  Physical Exam: Vitals:   10/02/22 0631 10/02/22 0819 10/02/22 0830 10/02/22 0845  BP:  (!) 72/48 (!) 183/127 (!) 161/125  Pulse:  97 99 (!) 106  Resp:  (!) 28 19 (!) 28  Temp: 98.2 F (36.8 C) (!) 97.4 F (36.3 C)    SpO2:  (!) 86% 98% 98%     General: Alert male, seated on bedside, appears slightly anxious. Head: Normocephalic, atraumatic, sclera non-icteric, mucus membranes are moist. Lungs: Intermittent tachypnea. + rales RLL. No wheezing or rhonchi, able to speak in full sentences Heart: RRR with normal S1, S2.  No murmurs, rubs, or gallops appreciated. Abdomen: Soft, non-distended with normoactive bowel sounds. No obvious abdominal masses. Musculoskeletal:  Strength and tone appear normal for age. Lower extremities: 1+ edema bilateral lower extremities Neuro: Alert and oriented X 3. Moves all extremities spontaneously. Psych:  Responds to questions appropriately with a normal affect. Dialysis Access: LUE AVF +bruit  No Known Allergies Prior to Admission medications   Medication Sig Start Date End Date Taking? Authorizing Provider  albuterol (VENTOLIN HFA) 108 (90 Base) MCG/ACT inhaler Inhale 2 puffs into the lungs every 6 (six) hours as needed for wheezing or shortness of breath. 01/06/22   [provider]  carvedilol (COREG) 6.25 MG tablet Take 1 tablet (6.25 mg total) by mouth 2 (two) times daily with a meal. 09/22/22 10/22/22  Patrecia Pour, MD  diltiazem (CARDIZEM CD) 360 MG 24 hr capsule Take 1 capsule (360 mg total) by mouth daily. 08/03/22 09/19/22  Arrien, Jimmy Picket, MD  DULoxetine ([place]) 20  MG capsule Take 20 mg by mouth daily. Patient not taking: Reported on 09/19/2022 01/06/22   [provider]  hydrALAZINE (APRESOLINE) 50 MG tablet Take 1 tablet (50 mg total) by mouth 2 (two) times daily. 09/22/22 10/22/22  Patrecia Pour, MD  lidocaine (LMX) 4 % cream Apply 1 Application topically 3 (three) times a week. 06/27/22   [provider]  LIDOCAINE-PRILOCAINE EX Apply 1 application  topically Every Tuesday,Thursday,and Saturday with dialysis.    [provider]  montelukast (SINGULAIR) 10 MG tablet Take 10 mg by mouth at bedtime. Patient not taking: Reported on 09/19/2022 08/24/22   [provider]  oxyCODONE (OXY IR/ROXICODONE) 5 MG immediate release tablet Take 5 mg by mouth 3 (three) times daily as needed for moderate pain or severe pain. 08/09/22   [provider]  pantoprazole (PROTONIX) 40 MG tablet Take 1 tablet (40 mg total) by mouth 2  (two) times daily before a meal. 08/14/22   Lavina Hamman, MD  sevelamer carbonate (RENVELA) 800 MG tablet Take 1,600 mg by mouth 4 (four) times daily. 09/15/22   [provider]   Current Facility-Administered Medications  Medication Dose Route Frequency Provider Last Rate Last Admin   Chlorhexidine Gluconate Cloth 2 % PADS 6 each  6 each Topical Q0600 Janalee Dane, PA-C       Current Outpatient Medications  Medication Sig Dispense Refill   albuterol (VENTOLIN HFA) 108 (90 Base) MCG/ACT inhaler Inhale 2 puffs into the lungs every 6 (six) hours as needed for wheezing or shortness of breath.     carvedilol (COREG) 6.25 MG tablet Take 1 tablet (6.25 mg total) by mouth 2 (two) times daily with a meal. 60 tablet 0   diltiazem (CARDIZEM CD) 360 MG 24 hr capsule Take 1 capsule (360 mg total) by mouth daily. 30 capsule 0   DULoxetine (CYMBALTA) 20 MG capsule Take 20 mg by mouth daily. (Patient not taking: Reported on 09/19/2022)     hydrALAZINE (APRESOLINE) 50 MG tablet Take 1 tablet (50 mg total) by mouth 2 (two) times daily. 60 tablet 0   lidocaine (LMX) 4 % cream Apply 1 Application topically 3 (three) times a week.     LIDOCAINE-PRILOCAINE EX Apply 1 application  topically Every Tuesday,Thursday,and Saturday with dialysis.     montelukast (SINGULAIR) 10 MG tablet Take 10 mg by mouth at bedtime. (Patient not taking: Reported on 09/19/2022)     oxyCODONE (OXY IR/ROXICODONE) 5 MG immediate release tablet Take 5 mg by mouth 3 (three) times daily as needed for moderate pain or severe pain.     pantoprazole (PROTONIX) 40 MG tablet Take 1 tablet (40 mg total) by mouth 2 (two) times daily before a meal. 120 tablet 0   sevelamer carbonate (RENVELA) 800 MG tablet Take 1,600 mg by mouth 4 (four) times daily.     Labs: Basic Metabolic Panel: Recent Labs  Lab 10/02/22 0346  NA 134*  K 4.4  CL 99  CO2 19*  GLUCOSE 73  BUN 60*  CREATININE 6.41*  CALCIUM 8.5*   Liver Function  Tests: Recent Labs  Lab 10/02/22 0346  AST 30  ALT 24  ALKPHOS 134*  BILITOT 1.7*  PROT 7.3  ALBUMIN 3.1*   No results for input(s): "LIPASE", "AMYLASE" in the last 168 hours. No results for input(s): "AMMONIA" in the last 168 hours. CBC: Recent Labs  Lab 10/02/22 0346  WBC 7.6  HGB 10.5*  HCT 32.1*  MCV 87.5  PLT  177   Cardiac Enzymes: No results for input(s): "CKTOTAL", "CKMB", "CKMBINDEX", "TROPONINI" in the last 168 hours. CBG: No results for input(s): "GLUCAP" in the last 168 hours. Iron Studies: No results for input(s): "IRON", "TIBC", "TRANSFERRIN", "FERRITIN" in the last 72 hours. Studies/Results: DG Chest Port 1 View  Result Date: 10/02/2022 CLINICAL DATA:  61 year old male with shortness of breath, fatigue, abdominal distension. Hepatitis. EXAM: PORTABLE CHEST 1 VIEW COMPARISON:  Portable chest 09/18/2022 and earlier. FINDINGS: Portable AP upright view at 0353 hours. Extubated and enteric tube removed. Stable cardiomegaly and mediastinal contours. Stable lung volumes. Patchy and platelike opacity persists in the right lower lung. No superimposed pneumothorax, pulmonary edema or pleural effusion. Stable ventilation elsewhere. Paucity of bowel gas in the visible abdomen. No acute osseous abnormality identified. IMPRESSION: 1. Extubated and enteric tube removed. 2. Stable cardiomegaly, and confluent although plate-like opacity in the right lower lung which more resembles atelectasis than pneumonia. No edema or effusion is evident. Electronically Signed   By: Genevie Ann M.D.   On: 10/02/2022 04:17    Dialysis Orders:  Center: [facility]  on TTS . 180NRe 3 hr 45 min BFR 450 DFR Auto 1.5 EDW 70.5kg 2K 2 Ca AVF 15g  No heparin Mircera 243mg IV q 2 weeks Hectorol 4 mcg IV q HD  Home Bp meds: carvedilol 6.'25mg'$  BID, diltiazem '360mg'$  QD, hydralazine '50mg'$  BID  Assessment/Plan:  Shortness of breath: CXR without edema but patient does appear volume overloaded and left his last HD  8.5kg over his EDW. He has also not been taking his antihypertensive medications. Will plan for HD today with max UF (4.5L), then resume regular schedule tomorrow. Needs ongoing counseling regarding adherence for fluid restrictions and medications.  ESRD:  TTS schedule but extra HD today, see above.   Hypertension/volume: See above, resume home BP meds and max UF with HD today.  Anemia: Hgb at goal. Not due for ESA yet.   Metabolic bone disease: [place] Continue VDRA and phosphorus binders (renvela 3 tabs per meal)  Nutrition:  Albumin 3.1, Will need renal diet and strict fluid restrictions once tolerating PO  SAnice Paganini PA-C 10/02/2022, 8:49 AM  [facility] Pager: [phone] I have seen and examined this patient and agree with plan and assessment in the above note with renal recommendations/intervention highlighted. Pt is on Bipap and unresponsive, VSS.  Will attempt urgent HD with UF as tolerated and follow his progress.  JGovernor RooksColadonato,MD 10/02/2022 12:47 PM

## 2022-10-02 NOTE — ED Notes (Signed)
Spoke with RT about taking pt off BIPAP for dialysis. Pt placed on 4L [place]. Will monitor pt.

## 2022-10-02 NOTE — ED Notes (Signed)
Pt transported on 4L via [place] at this time with all belongings to dialysis.

## 2022-10-02 NOTE — ED Notes (Signed)
PT increased work of breathing. Dr. Laverta Baltimore at bedside. BIPAP orders obtained. RT notified and at bedside

## 2022-10-03 ENCOUNTER — Inpatient Hospital Stay (HOSPITAL_COMMUNITY): Payer: Medicare Other

## 2022-10-03 DIAGNOSIS — J9601 Acute respiratory failure with hypoxia: Secondary | ICD-10-CM

## 2022-10-03 LAB — RENAL FUNCTION PANEL
Albumin: 2.9 g/dL — ABNORMAL LOW (ref 3.5–5.0)
Anion gap: 14 (ref 5–15)
BUN: 33 mg/dL — ABNORMAL HIGH (ref 8–23)
CO2: 27 mmol/L (ref 22–32)
Calcium: 8.2 mg/dL — ABNORMAL LOW (ref 8.9–10.3)
Chloride: 94 mmol/L — ABNORMAL LOW (ref 98–111)
Creatinine, Ser: 3.83 mg/dL — ABNORMAL HIGH (ref 0.61–1.24)
GFR, Estimated: 17 mL/min — ABNORMAL LOW (ref >60)
Glucose, Bld: 77 mg/dL (ref 70–99)
Phosphorus: 3.6 mg/dL (ref 2.5–4.6)
Potassium: 3.5 mmol/L (ref 3.5–5.1)
Sodium: 135 mmol/L (ref 135–145)

## 2022-10-03 LAB — CBC
HCT: 31.1 % — ABNORMAL LOW (ref 39.0–52.0)
Hemoglobin: 10.3 g/dL — ABNORMAL LOW (ref 13.0–17.0)
MCH: 28.5 pg (ref 26.0–34.0)
MCHC: 33.1 g/dL (ref 30.0–36.0)
MCV: 85.9 fL (ref 80.0–100.0)
Platelets: 149 10*3/uL — ABNORMAL LOW (ref 150–400)
RBC: 3.62 MIL/uL — ABNORMAL LOW (ref 4.22–5.81)
RDW: 16.6 % — ABNORMAL HIGH (ref 11.5–15.5)
WBC: 4.8 10*3/uL (ref 4.0–10.5)
nRBC: 0 % (ref 0.0–0.2)

## 2022-10-03 LAB — GLUCOSE, CAPILLARY: Glucose-Capillary: 99 mg/dL (ref 70–99)

## 2022-10-03 MED ORDER — CHLORHEXIDINE GLUCONATE CLOTH 2 % EX PADS: 6.0000 | MEDICATED_PAD | Freq: Every day | CUTANEOUS | Status: DC

## 2022-10-03 MED ORDER — NICOTINE 14 MG/24HR TD PT24: 14.0000 mg | MEDICATED_PATCH | Freq: Every day | TRANSDERMAL | Status: DC

## 2022-10-03 MED ORDER — HYDRALAZINE HCL 50 MG PO TABS
50.0000 mg | ORAL_TABLET | Freq: Three times a day (TID) | ORAL | Status: DC
  Administered 2022-10-03 – 2022-10-05 (×5): 50 mg via ORAL
  Filled 2022-10-03 (×2): qty 1
  Filled 2022-10-03: qty 2
  Filled 2022-10-03 (×3): qty 1

## 2022-10-03 MED ORDER — MORPHINE SULFATE (PF) 2 MG/ML IV SOLN
1.0000 mg | Freq: Once | INTRAVENOUS | Status: AC
  Administered 2022-10-03: 1 mg via INTRAVENOUS
  Filled 2022-10-03: qty 1

## 2022-10-03 MED ORDER — HYDRALAZINE HCL 20 MG/ML IJ SOLN: 10.0000 mg | Freq: Four times a day (QID) | INTRAMUSCULAR | Status: DC | PRN

## 2022-10-03 MED ORDER — AZITHROMYCIN 250 MG PO TABS
500.0000 mg | ORAL_TABLET | Freq: Every day | ORAL | Status: DC
  Administered 2022-10-03 – 2022-10-05 (×3): 500 mg via ORAL
  Filled 2022-10-03 (×3): qty 2

## 2022-10-03 MED ORDER — DILTIAZEM HCL ER COATED BEADS 180 MG PO CP24
360.0000 mg | ORAL_CAPSULE | Freq: Every day | ORAL | Status: DC
  Administered 2022-10-03 – 2022-10-04 (×2): 360 mg via ORAL
  Filled 2022-10-03: qty 2
  Filled 2022-10-03: qty 1

## 2022-10-03 MED ORDER — CARVEDILOL 6.25 MG PO TABS
6.2500 mg | ORAL_TABLET | Freq: Two times a day (BID) | ORAL | Status: DC
  Administered 2022-10-04 – 2022-10-05 (×3): 6.25 mg via ORAL
  Filled 2022-10-03 (×3): qty 1

## 2022-10-03 NOTE — Procedures (Signed)
HD Note:  Some information was entered later than the data was gathered due to patient care needs. The stated time with the data is accurate.  Received patient on a stretcher from the ER to unit.  Alert and oriented.  Informed consent signed and with the resident.  Patient tolerated well.   Transported back to the ER  Alert, without acute distress.  Hand-off given to patient's nurse.   Access used: Upper right arm fistula Access issues: None  Total UF removed: 2300 ml   [facility]

## 2022-10-03 NOTE — ED Notes (Signed)
Wife Glendale Youngblood [phone] would like an update asap

## 2022-10-03 NOTE — ED Notes (Signed)
Patient continues to unplug self from monitor, patient sitting up on edge of the bed for comfort. Patient refuses to allow self to be on any monitoring equipment.

## 2022-10-03 NOTE — ED Notes (Signed)
Was advised by Dr. Claria Dice that she was going to reach out to his wife but that was all that we could do for now that there is nothing else that we can do

## 2022-10-03 NOTE — ED Notes (Signed)
MD messaged a second time for one time dilaudid order stated he would place

## 2022-10-03 NOTE — Progress Notes (Signed)
[facility] Progress Note   Subjective:   Pt signed off HD early yesterday due to cramping. Was removing monitors and O2 overnight. This AM, he is eating breakfast and O2 is off but oxygen saturation 97-100%. He reports feeling better but still some SOB. No CP, dizziness or headaches.   Objective Vitals:   10/03/22 0345 10/03/22 0415 10/03/22 0445 10/03/22 0738  BP: (!) 165/116 (!) 161/117 (!) 156/115 (!) 161/116  Pulse: 82 80 83 88  Resp: 18 14 (!) 24 20  Temp:      TempSrc:      SpO2: 100% 100% 100% 100%  Weight:      Height:       Physical Exam General: Alert male, seated on bedside, NAD Heart: RRR, no murmurs, rubs or gallops Lungs: + rales RLL, otherwise CTA. Respirations unlabored on RA Abdomen: Soft, non-tender, non-distended, +BS Extremities: no edema b/l lower extremities Dialysis Access: LUE AVF + bruit  Additional Objective Labs: Basic Metabolic Panel: Recent Labs  Lab 10/02/22 0346  NA 134*  K 4.4  CL 99  CO2 19*  GLUCOSE 73  BUN 60*  CREATININE 6.41*  CALCIUM 8.5*   Liver Function Tests: Recent Labs  Lab 10/02/22 0346  AST 30  ALT 24  ALKPHOS 134*  BILITOT 1.7*  PROT 7.3  ALBUMIN 3.1*   No results for input(s): "LIPASE", "AMYLASE" in the last 168 hours. CBC: Recent Labs  Lab 10/02/22 0346  WBC 7.6  HGB 10.5*  HCT 32.1*  MCV 87.5  PLT 177   Blood Culture    Component Value Date/Time   SDES BLOOD BLOOD RIGHT HAND 09/18/2022 0625   SDES BLOOD BLOOD RIGHT HAND 09/18/2022 0625   SPECREQUEST  09/18/2022 0625    BOTTLES DRAWN AEROBIC AND ANAEROBIC Blood Culture adequate volume   SPECREQUEST  09/18/2022 0625    BOTTLES DRAWN AEROBIC AND ANAEROBIC Blood Culture adequate volume   CULT  09/18/2022 0625    NO GROWTH 5 DAYS Performed at [facility], [place] [address] [place], [place] [address]    CULT  09/18/2022 [phone]    NO GROWTH 5 DAYS Performed at [facility], [place] [address] [place], [place] [address]     REPTSTATUS 09/23/2022 FINAL 09/18/2022 0625   REPTSTATUS 09/23/2022 FINAL 09/18/2022 3825    Cardiac Enzymes: No results for input(s): "CKTOTAL", "CKMB", "CKMBINDEX", "TROPONINI" in the last 168 hours. CBG: Recent Labs  Lab 10/02/22 1124 10/02/22 2023  GLUCAP 61* 77   Iron Studies: No results for input(s): "IRON", "TIBC", "TRANSFERRIN", "FERRITIN" in the last 72 hours. '@lablastinr3'$ @ Studies/Results: DG Abd 1 View  Result Date: 10/03/2022 CLINICAL DATA:  Abdominal bloating/distension.  Pain. EXAM: ABDOMEN - 1 VIEW COMPARISON:  Pelvic radiograph 10/03/2022. Abdominal radiograph 09/18/2022. FINDINGS: Surgical clips and embolization coils are again noted in the right hemiabdomen. There is a single loop of mildly prominent small bowel in the left mid abdomen. No dilated loops of bowel are seen elsewhere. Gas and a small to moderate amount of stool are present in nondilated colon. Atherosclerotic vascular calcifications are noted. No acute osseous abnormality is seen. IMPRESSION: Single mildly prominent loop of small bowel in the left mid abdomen, nonspecific though localized inflammation/enteritis is possible. No definite evidence of bowel obstruction. Electronically Signed   By: Logan Bores M.D.   On: 10/03/2022 08:27   DG Pelvis 1-2 Views  Result Date: 10/03/2022 CLINICAL DATA:  Fall with pain. EXAM: PELVIS - 1-2 VIEW COMPARISON:  None  Available. FINDINGS: There is no evidence of pelvic fracture or diastasis. Degenerative changes are noted at the sacroiliac joints bilaterally. Vascular calcifications are present in the pelvis and bilateral lower extremities. Surgical changes are noted in the mid right abdomen. IMPRESSION: No acute fracture or dislocation. Electronically Signed   By: Brett Fairy M.D.   On: 10/03/2022 00:20   DG Chest Port 1 View  Result Date: 10/02/2022 CLINICAL DATA:  61 year old male with shortness of breath, fatigue, abdominal distension. Hepatitis. EXAM: PORTABLE  CHEST 1 VIEW COMPARISON:  Portable chest 09/18/2022 and earlier. FINDINGS: Portable AP upright view at 0353 hours. Extubated and enteric tube removed. Stable cardiomegaly and mediastinal contours. Stable lung volumes. Patchy and platelike opacity persists in the right lower lung. No superimposed pneumothorax, pulmonary edema or pleural effusion. Stable ventilation elsewhere. Paucity of bowel gas in the visible abdomen. No acute osseous abnormality identified. IMPRESSION: 1. Extubated and enteric tube removed. 2. Stable cardiomegaly, and confluent although plate-like opacity in the right lower lung which more resembles atelectasis than pneumonia. No edema or effusion is evident. Electronically Signed   By: Genevie Ann M.D.   On: 10/02/2022 04:17   Medications:   azithromycin  500 mg Oral Daily   carvedilol  6.25 mg Oral BID WC   Chlorhexidine Gluconate Cloth  6 each Topical Q0600   Chlorhexidine Gluconate Cloth  6 each Topical Q0600   diltiazem  360 mg Oral Daily   DULoxetine  20 mg Oral Daily   heparin  5,000 Units Subcutaneous Q8H   hydrALAZINE  50 mg Oral Q8H   montelukast  10 mg Oral QHS   nicotine  14 mg Transdermal Daily   pantoprazole  40 mg Oral BID AC   sevelamer carbonate  2,400 mg Oral TID WC   sodium chloride flush  3 mL Intravenous Q12H    Dialysis Orders: Center: [facility]  on TTS . 180NRe 3 hr 45 min BFR 450 DFR Auto 1.5 EDW 70.5kg 2K 2 Ca AVF 15g  No heparin Mircera 287mg IV q 2 weeks Hectorol 4 mcg IV q HD   Home Bp meds: carvedilol 6.'25mg'$  BID, diltiazem '360mg'$  QD, hydralazine '50mg'$  BID  Assessment/Plan:  Shortness of breath: CXR without edema but patient does appear volume overloaded and left his last HD 8.5kg over his EDW. He has also not been taking his antihypertensive medications. Had HD yesterday but signed off early against medical advise, still had 3L UF. fortunately respiratory status is improved. Needs ongoing counseling regarding adherence for fluid restrictions and  medications.  ESRD:  TTS schedule but extra HD yesterday, see above. Resume TTS schedule today.   Hypertension/volume: BP improved with home meds and HD. Needs further volume removal, 3L UF goal with HD today.   Anemia: Hgb at goal. Not due for ESA yet.   Metabolic bone disease: Calcium at goal.  Continue VDRA and phosphorus binders (renvela 3 tabs per meal)  Nutrition:  Albumin 3.1, Will need renal diet and strict fluid restrictions once tolerating PO  SAnice Paganini PA-C 10/03/2022, 9:57 AM  [facility] Pager: [phone]

## 2022-10-03 NOTE — Progress Notes (Addendum)
PROGRESS NOTE                                                                                                                                                                                                             Patient Demographics:    Timothy [name], is a 61 y.o. male, DOB - 03-Mar-1961, OFH:[id number]  Outpatient Primary MD for the patient is Timothy [name], Timothy Beard, MD    LOS - 1  Admit date - 10/02/2022    Chief Complaint  Patient presents with   Shortness of Breath    Hemodialysis       Brief Narrative (HPI from H&P)   61 y.o. male with medical history significant of ESRD on HD (TTS, hypertension, polysubstance abuse, and GI bleed who presents with complaints of shortness of breath.  History is limited due to patient being on BiPAP and work of breathing.  Just recently hospitalized from 12/3-12/8 for acute respiratory failure after leaving hemodialysis related to the patient not abide by fluid restrictions.  During the hospitalization patient required intubation for urgent dialysis with improvement.  Found to be positive for RSV and treated for superimposed bacterial pneumonia with leukocytosis and elevated procalcitonin levels.  Patient reports dialyzing last 2 days ago. Review of records at the time of admission noted that patient was 8.5 kg over's estimated dry weight.  He was diagnosed to be in fluid overload, and acute hypoxic respiratory failure, he was placed on BiPAP and admitted to the hospital.   Subjective:    [name] today has, No headache, No chest pain, No abdominal pain - No Nausea, No new weakness tingling or numbness, improved SOB   Assessment  & Plan :   Acute respiratory distress secondary to ESRD  possible healthcare associated pneumonia Patient presents with complaints of shortness of breath with tachypnea.  Patient was placed on BiPAP due to work of breathing.  On physical exam patient  with Rales noted in right lower lung field.  Weight was above his dry weight by at least 8 kg's upon admission, previous admission consistent with fluid overload, his presentation most likely is due to dietary noncompliance and fluid overload.  Was emergently dialyzed on 10/02/2022 with improvement, currently on BiPAP, will try to titrate off of BiPAP to nasal cannula oxygen and monitor.  Placed on diet and fluid restriction, counseled  on fluid restriction.  Do not think he has pneumonia.  Will de-escalate antibiotics and monitor closely if better likely discharge on 10/04/2022.    Threatened to leave AMA x 2 - counseled.      ESRD.  TTS schedule.  Renal on board.    Hypertensive urgency - Blood pressures initially elevated up to 187/130.  Due to hypoxia and shortness of breath, questionable compliance with medications, blood pressure medications adjusted as needed hydralazine added IV.  Will monitor.  Elevated troponin - Acute.  High-sensitivity troponins are 192-> 242.  Patient did not report any complaints of chest pain. EKG did not note any significant ischemic changes.  Suspect secondary to demand mismatch in setting of acute respiratory distress as well as hypertensive urgency in the setting of ESRD, symptom-free now continue to monitor clinically.  Anemia of chronic kidney disease   Stable.  Hemoglobin 10.5 which appears around patient's baseline. Continue to monitor.   Anxiety and depression   -Continue Cymbalta   Hypoglycemia - Acute.  On admission glucose noted to be as low as 61.  Patient was given amp of D50.  He is not on any oral hypoglycemic agents, likely due to poor oral intake and stress of hypoxia and work of breathing, stable continue to monitor CBGs.  CBG (last 3)  Recent Labs    10/02/22 1124 10/02/22 2023  GLUCAP 61* 77        Condition -  Guarded  Family Communication  : Message left for [place] [phone]  on 10/03/2022 at 8:32 AM  Code Status :   Full  Consults  :  Renal  PUD Prophylaxis : PPI   Procedures  :            Disposition Plan  :    Status is: Inpatient   DVT Prophylaxis  :    heparin injection 5,000 Units Start: 10/02/22 2200   Lab Results  Component Value Date   PLT 177 10/02/2022    Diet :  Diet Order             Diet renal with fluid restriction Fluid restriction: 1200 mL Fluid; Room service appropriate? Yes; Fluid consistency: Thin  Diet effective now                    Inpatient Medications  Scheduled Meds:  azithromycin  500 mg Oral Daily   carvedilol  6.25 mg Oral BID WC   Chlorhexidine Gluconate Cloth  6 each Topical Q0600   Chlorhexidine Gluconate Cloth  6 each Topical Q0600   diltiazem  360 mg Oral Daily   DULoxetine  20 mg Oral Daily   heparin  5,000 Units Subcutaneous Q8H   hydrALAZINE  50 mg Oral Q8H   montelukast  10 mg Oral QHS   nicotine  14 mg Transdermal Daily   pantoprazole  40 mg Oral BID AC   sevelamer carbonate  2,400 mg Oral TID WC   sodium chloride flush  3 mL Intravenous Q12H   Continuous Infusions: PRN Meds:.acetaminophen **OR** acetaminophen, albuterol, dextrose, hydrALAZINE, oxyCODONE  Antibiotics  :    Anti-infectives (From admission, onward)    Start     Dose/Rate Route Frequency Ordered Stop   10/03/22 1000  azithromycin (ZITHROMAX) tablet 500 mg        500 mg Oral Daily 10/03/22 0825 10/07/22 0959   10/02/22 2200  cefTRIAXone (ROCEPHIN) 2 g in sodium chloride 0.9 % 100 mL IVPB  Status:  Discontinued  2 g 200 mL/hr over 30 Minutes Intravenous Every 24 hours 10/02/22 1459 10/03/22 0825   10/02/22 2200  azithromycin (ZITHROMAX) 500 mg in sodium chloride 0.9 % 250 mL IVPB  Status:  Discontinued        500 mg 250 mL/hr over 60 Minutes Intravenous Every 24 hours 10/02/22 1500 10/03/22 0825         Objective:   Vitals:   10/03/22 0345 10/03/22 0415 10/03/22 0445 10/03/22 0738  BP: (!) 165/116 (!) 161/117 (!) 156/115 (!) 161/116  Pulse:  82 80 83 88  Resp: 18 14 (!) 24 20  Temp:      TempSrc:      SpO2: 100% 100% 100% 100%  Weight:      Height:        Wt Readings from Last 3 Encounters:  10/02/22 72.2 kg  09/22/22 72.2 kg  08/14/22 80 kg     Intake/Output Summary (Last 24 hours) at 10/03/2022 0831 Last data filed at 10/03/2022 0022 Gross per 24 hour  Intake 169.5 ml  Output 3 ml  Net 166.5 ml     Physical Exam  Awake Alert, No new F.N deficits, Normal affect [place].AT,PERRAL Supple Neck, No JVD,   Symmetrical Chest wall movement, Good air movement bilaterally, few rales RRR,No Gallops,Rubs or new Murmurs,  +ve B.Sounds, Abd Soft, No tenderness,   No Cyanosis, Clubbing or edema      Data Review:    Recent Labs  Lab 10/02/22 0346  WBC 7.6  HGB 10.5*  HCT 32.1*  PLT 177  MCV 87.5  MCH 28.6  MCHC 32.7  RDW 17.3*    Recent Labs  Lab 10/02/22 0346 10/02/22 0437 10/02/22 0810  NA 134*  --   --   K 4.4  --   --   CL 99  --   --   CO2 19*  --   --   ANIONGAP 16*  --   --   GLUCOSE 73  --   --   BUN 60*  --   --   CREATININE 6.41*  --   --   AST 30  --   --   ALT 24  --   --   ALKPHOS 134*  --   --   BILITOT 1.7*  --   --   ALBUMIN 3.1*  --   --   PROCALCITON  --   --  0.26  BNP  --  16.6  --   CALCIUM 8.5*  --   --       Recent Labs  Lab 10/02/22 0346 10/02/22 0437 10/02/22 0810  PROCALCITON  --   --  0.26  BNP  --  16.6  --   CALCIUM 8.5*  --   --     Recent Labs  Lab 10/02/22 0346 10/02/22 0810  WBC 7.6  --   PLT 177  --   PROCALCITON  --  0.26  CREATININE 6.41*  --     ------------------------------------------------------------------------------------------------------------------ No results for input(s): "CHOL", "HDL", "LDLCALC", "TRIG", "CHOLHDL", "LDLDIRECT" in the last 72 hours.  No results found for: "HGBA1C"  No results for input(s): "TSH", "T4TOTAL", "T3FREE", "THYROIDAB" in the last 72 hours.  Invalid input(s):  "FREET3" ------------------------------------------------------------------------------------------------------------------ Cardiac Enzymes No results for input(s): "CKMB", "TROPONINI", "MYOGLOBIN" in the last 168 hours.  Invalid input(s): "CK"  Micro Results Recent Results (from the past 240 hour(s))  Resp panel by RT-PCR (RSV, Flu A&B, Covid) Anterior Nasal Swab  Status: None   Collection Time: 10/02/22  3:42 AM   Specimen: Anterior Nasal Swab  Result Value Ref Range Status   SARS Coronavirus 2 by RT PCR NEGATIVE NEGATIVE Final    Comment: (NOTE) SARS-CoV-2 target nucleic acids are NOT DETECTED.  The SARS-CoV-2 RNA is generally detectable in upper respiratory specimens during the acute phase of infection. The lowest concentration of SARS-CoV-2 viral copies this assay can detect is 138 copies/mL. A negative result does not preclude SARS-Cov-2 infection and should not be used as the sole basis for treatment or other patient management decisions. A negative result may occur with  improper specimen collection/handling, submission of specimen other than nasopharyngeal swab, presence of viral mutation(s) within the areas targeted by this assay, and inadequate number of viral copies(<138 copies/mL). A negative result must be combined with clinical observations, patient history, and epidemiological information. The expected result is Negative.  Fact Sheet for Patients:  EntrepreneurPulse.com.au  Fact Sheet for Healthcare Providers:  IncredibleEmployment.be  This test is no t yet approved or cleared by the [place] FDA and  has been authorized for detection and/or diagnosis of SARS-CoV-2 by FDA under an Emergency Use Authorization (EUA). This EUA will remain  in effect (meaning this test can be used) for the duration of the COVID-19 declaration under Section 564(b)(1) of the Act, 21 U.S.C.section 360bbb-3(b)(1), unless the authorization is  terminated  or revoked sooner.       Influenza A by PCR NEGATIVE NEGATIVE Final   Influenza B by PCR NEGATIVE NEGATIVE Final    Comment: (NOTE) The Xpert Xpress SARS-CoV-2/FLU/RSV plus assay is intended as an aid in the diagnosis of influenza from Nasopharyngeal swab specimens and should not be used as a sole basis for treatment. Nasal washings and aspirates are unacceptable for Xpert Xpress SARS-CoV-2/FLU/RSV testing.  Fact Sheet for Patients: EntrepreneurPulse.com.au  Fact Sheet for Healthcare Providers: IncredibleEmployment.be  This test is not yet approved or cleared by the [place] FDA and has been authorized for detection and/or diagnosis of SARS-CoV-2 by FDA under an Emergency Use Authorization (EUA). This EUA will remain in effect (meaning this test can be used) for the duration of the COVID-19 declaration under Section 564(b)(1) of the Act, 21 U.S.C. section 360bbb-3(b)(1), unless the authorization is terminated or revoked.     Resp Syncytial Virus by PCR NEGATIVE NEGATIVE Final    Comment: (NOTE) Fact Sheet for Patients: EntrepreneurPulse.com.au  Fact Sheet for Healthcare Providers: IncredibleEmployment.be  This test is not yet approved or cleared by the [place] FDA and has been authorized for detection and/or diagnosis of SARS-CoV-2 by FDA under an Emergency Use Authorization (EUA). This EUA will remain in effect (meaning this test can be used) for the duration of the COVID-19 declaration under Section 564(b)(1) of the Act, 21 U.S.C. section 360bbb-3(b)(1), unless the authorization is terminated or revoked.  Performed at [facility], [place] [address], [place], [place] [address]     Radiology Reports DG Abd 1 View  Result Date: 10/03/2022 CLINICAL DATA:  Abdominal bloating/distension.  Pain. EXAM: ABDOMEN - 1 VIEW COMPARISON:  Pelvic radiograph 10/03/2022. Abdominal  radiograph 09/18/2022. FINDINGS: Surgical clips and embolization coils are again noted in the right hemiabdomen. There is a single loop of mildly prominent small bowel in the left mid abdomen. No dilated loops of bowel are seen elsewhere. Gas and a small to moderate amount of stool are present in nondilated colon. Atherosclerotic vascular calcifications are noted. No acute osseous abnormality is seen. IMPRESSION:  Single mildly prominent loop of small bowel in the left mid abdomen, nonspecific though localized inflammation/enteritis is possible. No definite evidence of bowel obstruction. Electronically Signed   By: Logan Bores M.D.   On: 10/03/2022 08:27   DG Pelvis 1-2 Views  Result Date: 10/03/2022 CLINICAL DATA:  Fall with pain. EXAM: PELVIS - 1-2 VIEW COMPARISON:  None Available. FINDINGS: There is no evidence of pelvic fracture or diastasis. Degenerative changes are noted at the sacroiliac joints bilaterally. Vascular calcifications are present in the pelvis and bilateral lower extremities. Surgical changes are noted in the mid right abdomen. IMPRESSION: No acute fracture or dislocation. Electronically Signed   By: Brett Fairy M.D.   On: 10/03/2022 00:20   DG Chest Port 1 View  Result Date: 10/02/2022 CLINICAL DATA:  61 year old male with shortness of breath, fatigue, abdominal distension. Hepatitis. EXAM: PORTABLE CHEST 1 VIEW COMPARISON:  Portable chest 09/18/2022 and earlier. FINDINGS: Portable AP upright view at 0353 hours. Extubated and enteric tube removed. Stable cardiomegaly and mediastinal contours. Stable lung volumes. Patchy and platelike opacity persists in the right lower lung. No superimposed pneumothorax, pulmonary edema or pleural effusion. Stable ventilation elsewhere. Paucity of bowel gas in the visible abdomen. No acute osseous abnormality identified. IMPRESSION: 1. Extubated and enteric tube removed. 2. Stable cardiomegaly, and confluent although plate-like opacity in the right  lower lung which more resembles atelectasis than pneumonia. No edema or effusion is evident. Electronically Signed   By: Genevie Ann M.D.   On: 10/02/2022 04:17      Signature  -   Lala Lund M.D on 10/03/2022 at 8:31 AM   -  To page go to [email or web address]

## 2022-10-03 NOTE — ED Notes (Signed)
Pt called. Entered into the room and found pt removed bipap, removed the monitor off and was attempting to stand up to go to the bathroom. Pt was advised that the last time that he went to the restroom he fell that he was unable to ambulate to the restroom without assistance. Pt stood up and attempted to ambulate to restroom with this RN assistance. Pt took one step and then he lend over on the bed. Pt was advised at that time that he wasn't able to go to the restroom that this RN would get a bedside commode into the room. Bedside commode was located and placed in the room and pt was able to stand and pivot to the bedside commode. Call bell was placed near pt and pt was advised to not get off the bedside commode without calling out

## 2022-10-03 NOTE — ED Notes (Signed)
Pt is removing bipap and refusing the IV antibiotic, has removed the monitor. Dr. Claria Dice aware and is at bedside

## 2022-10-03 NOTE — ED Notes (Signed)
BIPAP removed per admitting MD. Pt placed on 4L [place], oxygen saturation maintaining at 100% at this time.

## 2022-10-03 NOTE — Evaluation (Signed)
Clinical/Bedside Swallow Evaluation Patient Details  Name: Caillou Minus MRN: [id number] Date of Birth: [birth year]  Today's Date: 10/03/2022 Time: SLP Start Time (ACUTE ONLY): 67 SLP Stop Time (ACUTE ONLY): 1025 SLP Time Calculation (min) (ACUTE ONLY): 10 min  Past Medical History:  Past Medical History:  Diagnosis Date   Acute metabolic encephalopathy 40/98/1191   Anemia of chronic kidney failure    BPH (benign prostatic hyperplasia)    Colon cancer ([place]) 2014   spouse states he had surgury for colon CA   End stage renal disease on dialysis ([place]) 2017   TTHSat   Hepatitis C    treated   Hypertension    Hypertensive crisis 04/07/2022   Hypertensive heart disease with chronic diastolic congestive heart failure ([place]) 07/17/2016   Polysubstance abuse ([place])    History of heroin and marijuana use   Thoracic aortic aneurysm ([place])    followed by Dr. Ellyn Hack   Past Surgical History:  Past Surgical History:  Procedure Laterality Date   A/V FISTULAGRAM Left 12/02/2021   Procedure: A/V Fistulagram;  Surgeon: Cherre Robins, MD;  Location: [place] [facility];  Service: Cardiovascular;  Laterality: Left;   A/V FISTULAGRAM Left 04/24/2022   Procedure: A/V Fistulagram;  Surgeon: Waynetta Sandy, MD;  Location: [place] [facility];  Service: Cardiovascular;  Laterality: Left;   ABDOMINAL SURGERY     AV FISTULA PLACEMENT Left 09/19/2016   Procedure: Left arm Radiocephalic ARTERIOVENOUS (AV) FISTULA CREATION;  Surgeon: Conrad Penndel, MD;  Location: [facility];  Service: Vascular;  Laterality: Left;   BASCILIC VEIN TRANSPOSITION Left 07/09/2017   Procedure: BRACHIOCEPHALIC FISTULA CREATION;  Surgeon: Conrad [place], MD;  Location: [place];  Service: Vascular;  Laterality: Left;   BIOPSY  07/05/2022   Procedure: BIOPSY;  Surgeon: Thornton Park, MD;  Location: [place];  Service: Gastroenterology;;   COLON SURGERY  2014   COLONOSCOPY N/A 07/12/2022   Procedure: COLONOSCOPY;  Surgeon:  Doran Stabler, MD;  Location: [facility];  Service: Gastroenterology;  Laterality: N/A;   COLONOSCOPY WITH PROPOFOL N/A 07/05/2022   Procedure: COLONOSCOPY WITH PROPOFOL;  Surgeon: Thornton Park, MD;  Location: [place];  Service: Gastroenterology;  Laterality: N/A;   ENTEROSCOPY N/A 07/28/2022   Procedure: ENTEROSCOPY;  Surgeon: [name], MD;  Location: [facility] ENDOSCOPY;  Service: Gastroenterology;  Laterality: N/A;   ESOPHAGOGASTRODUODENOSCOPY (EGD) WITH PROPOFOL N/A 11/05/2021   Procedure: ESOPHAGOGASTRODUODENOSCOPY (EGD) WITH PROPOFOL;  Surgeon: Carol Ada, MD;  Location: [place];  Service: Endoscopy;  Laterality: N/A;   ESOPHAGOGASTRODUODENOSCOPY (EGD) WITH PROPOFOL N/A 11/14/2021   Procedure: ESOPHAGOGASTRODUODENOSCOPY (EGD) WITH PROPOFOL;  Surgeon: [name], MD;  Location: [place];  Service: Gastroenterology;  Laterality: N/A;   ESOPHAGOGASTRODUODENOSCOPY (EGD) WITH PROPOFOL N/A 11/16/2021   Procedure: ESOPHAGOGASTRODUODENOSCOPY (EGD) WITH PROPOFOL;  Surgeon: [name], MD;  Location: [place];  Service: Gastroenterology;  Laterality: N/A;   ESOPHAGOGASTRODUODENOSCOPY (EGD) WITH PROPOFOL N/A 07/05/2022   Procedure: ESOPHAGOGASTRODUODENOSCOPY (EGD) WITH PROPOFOL;  Surgeon: Thornton Park, MD;  Location: [place];  Service: Gastroenterology;  Laterality: N/A;   ESOPHAGOGASTRODUODENOSCOPY (EGD) WITH PROPOFOL N/A 07/12/2022   Procedure: ESOPHAGOGASTRODUODENOSCOPY (EGD) WITH PROPOFOL;  Surgeon: Doran Stabler, MD;  Location: [place];  Service: Gastroenterology;  Laterality: N/A;   ESOPHAGOGASTRODUODENOSCOPY (EGD) WITH PROPOFOL N/A 07/31/2022   Procedure: ESOPHAGOGASTRODUODENOSCOPY (EGD) WITH PROPOFOL;  Surgeon: Ladene Artist, MD;  Location: [place];  Service: Gastroenterology;  Laterality: N/A;   ESOPHAGOGASTRODUODENOSCOPY (EGD) WITH PROPOFOL N/A 08/13/2022   Procedure:  ESOPHAGOGASTRODUODENOSCOPY (EGD) WITH PROPOFOL;  Surgeon: Gatha Mayer, MD;  Location: [place];  Service: Gastroenterology;  Laterality: N/A;   HEMOSTASIS CLIP PLACEMENT  11/16/2021   Procedure: HEMOSTASIS CLIP PLACEMENT;  Surgeon: [name], MD;  Location: [facility] ENDOSCOPY;  Service: Gastroenterology;;   HEMOSTASIS CLIP PLACEMENT  07/05/2022   Procedure: HEMOSTASIS CLIP PLACEMENT;  Surgeon: Thornton Park, MD;  Location: [facility] ENDOSCOPY;  Service: Gastroenterology;;   HEMOSTASIS CLIP PLACEMENT  08/13/2022   Procedure: HEMOSTASIS CLIP PLACEMENT;  Surgeon: Gatha Mayer, MD;  Location: [facility] ENDOSCOPY;  Service: Gastroenterology;;   HEMOSTASIS CONTROL  11/05/2021   Procedure: HEMOSTASIS CONTROL;  Surgeon: Carol Ada, MD;  Location: [place];  Service: Endoscopy;;   HEMOSTASIS CONTROL  11/16/2021   Procedure: HEMOSTASIS CONTROL;  Surgeon: [name], MD;  Location: [place];  Service: Gastroenterology;;   HEMOSTASIS CONTROL  07/05/2022   Procedure: HEMOSTASIS CONTROL;  Surgeon: Thornton Park, MD;  Location: [place];  Service: Gastroenterology;;   HOT HEMOSTASIS N/A 11/16/2021   Procedure: HOT HEMOSTASIS (ARGON PLASMA COAGULATION/BICAP);  Surgeon: [name], MD;  Location: [place];  Service: Gastroenterology;  Laterality: N/A;   HOT HEMOSTASIS N/A 08/13/2022   Procedure: HOT HEMOSTASIS (ARGON PLASMA COAGULATION/BICAP);  Surgeon: Gatha Mayer, MD;  Location: [place];  Service: Gastroenterology;  Laterality: N/A;   INSERTION OF DIALYSIS CATHETER N/A 09/19/2016   Procedure: INSERTION OF TUNNELED DIALYSIS CATHETER;  Surgeon: Conrad Tynan, MD;  Location: [place];  Service: Vascular;  Laterality: N/A;   IR ANGIOGRAM SELECTIVE EACH ADDITIONAL VESSEL  11/16/2021   IR ANGIOGRAM VISCERAL SELECTIVE  11/05/2021   IR ANGIOGRAM VISCERAL SELECTIVE  11/16/2021   IR ANGIOGRAM VISCERAL SELECTIVE  11/16/2021   IR EMBO ART  VEN HEMORR LYMPH EXTRAV  INC GUIDE ROADMAPPING  11/05/2021   IR EMBO ART  VEN HEMORR LYMPH EXTRAV  INC GUIDE ROADMAPPING   11/16/2021   IR GENERIC HISTORICAL  09/14/2016   IR US GUIDE VASC ACCESS RIGHT 09/14/2016 [name], DO [facility]   IR GENERIC HISTORICAL  09/14/2016   IR FLUORO GUIDE CV LINE RIGHT 09/14/2016 [name], DO [facility]   IR PARACENTESIS  06/22/2021   IR US GUIDE [place] RIGHT  11/05/2021   IR US GUIDE [place] RIGHT  11/16/2021   LAPAROTOMY N/A 05/23/2021   Procedure: EXPLORATORY LAPAROTOMY LYSIS ADHESIONS;  Surgeon: [name], MD;  Location: [place];  Service: General;  Laterality: N/A;   LAPAROTOMY N/A 11/20/2021   Procedure: EXPLORATORY LAPAROTOMY, REPAIR OF BLEEDING DUODENAL ULCER;  Surgeon: Dwan Bolt, MD;  Location: [place];  Service: General;  Laterality: N/A;   ORIF TIBIA PLATEAU Left 01/15/2018   Procedure: OPEN REDUCTION INTERNAL FIXATION (ORIF) TIBIAL PLATEAU;  Surgeon: Altamese Forestbrook, MD;  Location: [place];  Service: Orthopedics;  Laterality: Left;   REVISON OF ARTERIOVENOUS FISTULA Left 05/26/2022   Procedure: REVISION OF LEFT ARM ARTERIOVENOUS FISTULA WITH PLICATION OF PSEUDOANEURYSM;  Surgeon: Waynetta Sandy, MD;  Location: [place];  Service: Vascular;  Laterality: Left;   SCLEROTHERAPY  11/05/2021   Procedure: Clide Deutscher;  Surgeon: Carol Ada, MD;  Location: [place];  Service: Endoscopy;;   SCLEROTHERAPY  11/16/2021   Procedure: Clide Deutscher;  Surgeon: [name], MD;  Location: [facility] ENDOSCOPY;  Service: Gastroenterology;;   TRANSTHORACIC ECHOCARDIOGRAM  07/2016    EF 60-65%, No RWMA. Mod Concentric LVH - Gr 2 DD. Severe LA dilation. PAP ~35 mmHg (mild Pulm HTN)  --> no changes noted 1  month later   HPI:  Patient is a 61 y.o. male with PMH: ESRD on HD, HTN, polysubstance abuse, GI bleed. He presented to the ED on 10/02/22 with c/o SOB. He was recently admitted 12/3-12/8/23 with acute respiratory failure believed to be related to patient's poor compliance with fluid restrictions. During that hospitalization, patient required intubation for  urgent diaylsis. For current admission, patient required BiPAP due to WOB, CXR showed opacity in the right lower lung which was thought to be more likely atelectasis than pneumonia with no significant edema appreciated.    Assessment / Plan / Recommendation  Clinical Impression  Patient is not currently presenting with any clinical s/s of dysphagia as per this bedside swallow evaluation. Per RN, she has not observed any coughing or throat clearing when taking PO meds and with breakfast meal tray. Patient himself denies any h/o or current dysphagia. He only had general c/o of feeling "weak" and that this started when he got here to the hospital. Patient was agreeble to PO intake of thin liquids (water) via straw sips with swallow initiation appearing timely and no overt s/s aspiration or penetration observed. Patient declined any solids and he then proceeded to lay down in bed. SLP not recommending any further skilled interventions at this time and penetration/aspiration of PO's not suspected. SLP Visit Diagnosis: Dysphagia, unspecified (R13.10)    Aspiration Risk  No limitations    Diet Recommendation Regular;Thin liquid   Liquid Administration via: Cup;Straw Medication Administration: Whole meds with liquid Supervision: Patient able to self feed Postural Changes: Seated upright at 90 degrees    Other  Recommendations Oral Care Recommendations: Oral care BID    Recommendations for follow up therapy are one component of a multi-disciplinary discharge planning process, led by the attending physician.  Recommendations may be updated based on patient status, additional functional criteria and insurance authorization.  Follow up Recommendations No SLP follow up      Assistance Recommended at Discharge  N/A  Functional Status Assessment Patient has not had a recent decline in their functional status  Frequency and Duration     N/a       Prognosis   N/A     Swallow Study   General Date  of Onset: 10/02/22 HPI: Patient is a 61 y.o. male with PMH: ESRD on HD, HTN, polysubstance abuse, GI bleed. He presented to the ED on 10/02/22 with c/o SOB. He was recently admitted 12/3-12/8/23 with acute respiratory failure believed to be related to patient's poor compliance with fluid restrictions. During that hospitalization, patient required intubation for urgent diaylsis. For current admission, patient required BiPAP due to WOB, CXR showed opacity in the right lower lung which was thought to be more likely atelectasis than pneumonia with no significant edema appreciated. Type of Study: Bedside Swallow Evaluation Previous Swallow Assessment: none found Diet Prior to this Study: Regular;Thin liquids Temperature Spikes Noted: No Respiratory Status: Room air History of Recent Intubation: No Behavior/Cognition: Alert Oral Cavity Assessment: Other (comment) (not assessed) Oral Care Completed by SLP: No Vision: Functional for self-feeding Self-Feeding Abilities: Able to feed self Patient Positioning: Upright in bed Baseline Vocal Quality: Normal Volitional Cough: Strong Volitional Swallow: Able to elicit    Oral/Motor/Sensory Function Overall Oral Motor/Sensory Function: Within functional limits   Ice Chips     Thin Liquid Thin Liquid: Within functional limits Presentation: Self Fed;Straw    Nectar Thick     Honey Thick     Puree Puree: Not tested   Solid  Solid: Not tested     Sonia Baller, MA, CCC-SLP Speech Therapy

## 2022-10-03 NOTE — ED Notes (Signed)
Hot tea provided transport here to transport to HD

## 2022-10-03 NOTE — ED Notes (Signed)
Called to room by Dr. Christy Gentles. Pt is yelling that he needs his PIV out and has removed the bipap machine. Pt continues to go back and forth about having his PIV connected to the antibiotics and letting them finish. Was able to talk pt into placing the bipap back on but he is refusing to have the PIV connected back and states no more. Dr. Claria Dice pt at this time to make aware of same

## 2022-10-03 NOTE — ED Notes (Signed)
Patient transported to XR. 

## 2022-10-03 NOTE — ED Notes (Signed)
Family at bedside with pt at this time. Pt is currently letting this RN to finish infusing the antibiotics that were ordered

## 2022-10-03 NOTE — ED Notes (Signed)
Patient continues to remove monitoring equipment and oxygen. Attempting to place monitoring equipment back on, patient declined.

## 2022-10-04 DIAGNOSIS — N186 End stage renal disease: Secondary | ICD-10-CM | POA: Diagnosis not present

## 2022-10-04 DIAGNOSIS — Z992 Dependence on renal dialysis: Secondary | ICD-10-CM | POA: Diagnosis not present

## 2022-10-04 LAB — CBC
HCT: 30.6 % — ABNORMAL LOW (ref 39.0–52.0)
Hemoglobin: 10 g/dL — ABNORMAL LOW (ref 13.0–17.0)
MCH: 28 pg (ref 26.0–34.0)
MCHC: 32.7 g/dL (ref 30.0–36.0)
MCV: 85.7 fL (ref 80.0–100.0)
Platelets: 133 10*3/uL — ABNORMAL LOW (ref 150–400)
RBC: 3.57 MIL/uL — ABNORMAL LOW (ref 4.22–5.81)
RDW: 16.9 % — ABNORMAL HIGH (ref 11.5–15.5)
WBC: 3.8 10*3/uL — ABNORMAL LOW (ref 4.0–10.5)
nRBC: 0 % (ref 0.0–0.2)

## 2022-10-04 LAB — RENAL FUNCTION PANEL
Albumin: 2.8 g/dL — ABNORMAL LOW (ref 3.5–5.0)
Anion gap: 13 (ref 5–15)
BUN: 38 mg/dL — ABNORMAL HIGH (ref 8–23)
CO2: 27 mmol/L (ref 22–32)
Calcium: 7.9 mg/dL — ABNORMAL LOW (ref 8.9–10.3)
Chloride: 94 mmol/L — ABNORMAL LOW (ref 98–111)
Creatinine, Ser: 4.51 mg/dL — ABNORMAL HIGH (ref 0.61–1.24)
GFR, Estimated: 14 mL/min — ABNORMAL LOW (ref >60)
Glucose, Bld: 96 mg/dL (ref 70–99)
Phosphorus: 4.2 mg/dL (ref 2.5–4.6)
Potassium: 3.4 mmol/L — ABNORMAL LOW (ref 3.5–5.1)
Sodium: 134 mmol/L — ABNORMAL LOW (ref 135–145)

## 2022-10-04 LAB — GLUCOSE, CAPILLARY: Glucose-Capillary: 131 mg/dL — ABNORMAL HIGH (ref 70–99)

## 2022-10-04 MED ORDER — LOSARTAN POTASSIUM 25 MG PO TABS
12.5000 mg | ORAL_TABLET | Freq: Every day | ORAL | Status: DC
  Administered 2022-10-04 – 2022-10-05 (×2): 12.5 mg via ORAL
  Filled 2022-10-04 (×2): qty 1

## 2022-10-04 MED ORDER — ROSUVASTATIN CALCIUM 5 MG PO TABS
10.0000 mg | ORAL_TABLET | Freq: Every day | ORAL | Status: DC
  Administered 2022-10-04 – 2022-10-05 (×2): 10 mg via ORAL
  Filled 2022-10-04 (×2): qty 2

## 2022-10-04 MED ORDER — ASPIRIN 81 MG PO CHEW
81.0000 mg | CHEWABLE_TABLET | Freq: Every day | ORAL | Status: DC
  Administered 2022-10-04 – 2022-10-05 (×2): 81 mg via ORAL
  Filled 2022-10-04 (×2): qty 1

## 2022-10-04 MED ORDER — PENTAFLUOROPROP-TETRAFLUOROETH EX AERO
INHALATION_SPRAY | CUTANEOUS | Status: AC
  Filled 2022-10-04: qty 30

## 2022-10-04 MED ORDER — HEPARIN SODIUM (PORCINE) 1000 UNIT/ML DIALYSIS
20.0000 [IU]/kg | INTRAMUSCULAR | Status: DC | PRN
  Administered 2022-10-04: 1500 [IU] via INTRAVENOUS_CENTRAL
  Filled 2022-10-04: qty 2

## 2022-10-04 NOTE — Progress Notes (Signed)
Patient refused Q4hr CBGs.

## 2022-10-04 NOTE — Progress Notes (Signed)
   10/04/22 1000  Mobility  Activity Ambulated with assistance in hallway  Level of Assistance Contact guard assist, steadying assist  Assistive Device Front wheel walker  Distance Ambulated (ft) 60 ft  Activity Response Tolerated well  Mobility Referral Yes  $Mobility charge 1 Mobility   Mobility Specialist Progress Note  Pre-Mobility: 97%SpO2 During Mobility: 96% SpO2 Post-Mobility: 98% SpO2  Pt was in bed and agreeable. Mobility cut short d/t c/o feeling SOB. Returned to bed w/ all needs met and call bell in reach.  Lucious Groves Mobility Specialist  Please contact via SecureChat or Rehab office at [phone]

## 2022-10-04 NOTE — Progress Notes (Addendum)
PROGRESS NOTE                                                                                                                                                                                                             Patient Demographics:    Timothy [name], is a 61 y.o. male, DOB - 1961/09/15, NOB:[id number]  Outpatient Primary MD for the patient is Osei-Bonsu, Iona Beard, MD    LOS - 2  Admit date - 10/02/2022    Chief Complaint  Patient presents with   Shortness of Breath    Hemodialysis       Brief Narrative (HPI from H&P)   61 y.o. male with medical history significant of ESRD on HD (TTS, hypertension, polysubstance abuse, and GI bleed who presents with complaints of shortness of breath.  History is limited due to patient being on BiPAP and work of breathing.  Just recently hospitalized from 12/3-12/8 for acute respiratory failure after leaving hemodialysis related to the patient not abide by fluid restrictions.  During the hospitalization patient required intubation for urgent dialysis with improvement.  Found to be positive for RSV and treated for superimposed bacterial pneumonia with leukocytosis and elevated procalcitonin levels.  Patient reports dialyzing last 2 days ago. Review of records at the time of admission noted that patient was 8.5 kg over's estimated dry weight.  He was diagnosed to be in fluid overload, and acute hypoxic respiratory failure, he was placed on BiPAP and admitted to the hospital.   Subjective:    Timothy [name] today has, No headache, No chest pain, No abdominal pain - No Nausea, No new weakness tingling or numbness, improved SOB   Assessment  & Plan :   Acute respiratory distress secondary to ESRD  possible healthcare associated pneumonia  - Patient presents with complaints of shortness of breath with tachypnea.  Patient was placed on BiPAP due to work of breathing.  On physical exam patient  with Rales noted in right lower lung field.  Weight was above his dry weight by at least 8 kg's upon admission, previous admission consistent with fluid overload, his presentation most likely is due to dietary noncompliance and fluid overload.  Was emergently dialyzed on 10/02/2022 with improvement, now off of BiPAP, repeat dialysis on 10/03/2022, still not back to his baseline and short of breath on exertion, discussed with  nephrology repeat dialysis on 10/04/2022 placed on diet and fluid restriction, counseled on fluid restriction.  Do not think he has pneumonia and antibiotics being de-escalated.  Threatened to leave AMA x 2 - counseled.      ESRD.  TTS schedule.  Renal on board.    Hypertensive urgency - Blood pressures initially elevated up to 187/130.  Due to hypoxia and shortness of breath, questionable compliance with medications, blood pressure medications adjusted as needed hydralazine added IV.  Will monitor.  Elevated troponin - Acute.  High-sensitivity troponins are 192-> 242.  Patient did not report any complaints of chest pain. EKG did not note any significant ischemic changes.  Suspect secondary to demand mismatch in setting of acute respiratory distress as well as hypertensive urgency in the setting of ESRD, symptom-free now continue to monitor clinically.  Kindly see below.  Acute on chronic systolic heart failure EF 45%.  Recurrent pulmonary edema, treatment as in #1 above, on Coreg, low-dose ARB, will add aspirin and statin as well and obtain cardiology input.    Anemia of chronic kidney disease   Stable.  Hemoglobin 10.5 which appears around patient's baseline. Continue to monitor.   Anxiety and depression   -Continue Cymbalta   Hypoglycemia - Acute.  On admission glucose noted to be as low as 61.  Patient was given amp of D50.  He is not on any oral hypoglycemic agents, likely due to poor oral intake and stress of hypoxia and work of breathing, stable continue to monitor  CBGs.  CBG (last 3)  Recent Labs    10/02/22 2023 10/03/22 2051 10/04/22 0015  GLUCAP 77 99 131*        Condition -  Guarded  Family Communication  : Message left for Timothy [name] [phone]  on 10/03/2022 at 8:32 AM  Code Status :  Full  Consults  :  Renal, cardiology  PUD Prophylaxis : PPI   Procedures  :            Disposition Plan  :    Status is: Inpatient   DVT Prophylaxis  :    heparin injection 5,000 Units Start: 10/02/22 2200   Lab Results  Component Value Date   PLT 149 (L) 10/03/2022    Diet :  Diet Order             Diet renal with fluid restriction Fluid restriction: 1200 mL Fluid; Room service appropriate? Yes; Fluid consistency: Thin  Diet effective now                    Inpatient Medications  Scheduled Meds:  azithromycin  500 mg Oral Daily   carvedilol  6.25 mg Oral BID WC   Chlorhexidine Gluconate Cloth  6 each Topical Q0600   Chlorhexidine Gluconate Cloth  6 each Topical Q0600   diltiazem  360 mg Oral Daily   DULoxetine  20 mg Oral Daily   heparin  5,000 Units Subcutaneous Q8H   hydrALAZINE  50 mg Oral Q8H   montelukast  10 mg Oral QHS   nicotine  14 mg Transdermal Daily   pantoprazole  40 mg Oral BID AC   sevelamer carbonate  2,400 mg Oral TID WC   sodium chloride flush  3 mL Intravenous Q12H   Continuous Infusions: PRN Meds:.acetaminophen **OR** acetaminophen, albuterol, dextrose, hydrALAZINE, oxyCODONE    Objective:   Vitals:   10/03/22 2102 10/04/22 0030 10/04/22 0400 10/04/22 0728  BP: (!) 151/96  138/88 (!) 138/91  Pulse:   72 72  Resp:   18 18  Temp:   98 F (36.7 C) (!) 97.3 F (36.3 C)  TempSrc:   Oral Oral  SpO2:   92% 96%  Weight:  76.7 kg    Height:        Wt Readings from Last 3 Encounters:  10/04/22 76.7 kg  09/22/22 72.2 kg  08/14/22 80 kg     Intake/Output Summary (Last 24 hours) at 10/04/2022 0849 Last data filed at 10/03/2022 1918 Gross per 24 hour  Intake --  Output 2300 ml  Net  -2300 ml    Physical Exam  Awake Alert, No new F.N deficits, Normal affect [place].AT,PERRAL Supple Neck, No JVD,   Symmetrical Chest wall movement, Good air movement bilaterally, few rales RRR,No Gallops,Rubs or new Murmurs,  +ve B.Sounds, Abd Soft, No tenderness,   No Cyanosis, Clubbing or edema      Data Review:    Recent Labs  Lab 10/02/22 0346 10/03/22 1959  WBC 7.6 4.8  HGB 10.5* 10.3*  HCT 32.1* 31.1*  PLT 177 149*  MCV 87.5 85.9  MCH 28.6 28.5  MCHC 32.7 33.1  RDW 17.3* 16.6*    Recent Labs  Lab 10/02/22 0346 10/02/22 0437 10/02/22 0810 10/03/22 1955  NA 134*  --   --  135  K 4.4  --   --  3.5  CL 99  --   --  94*  CO2 19*  --   --  27  ANIONGAP 16*  --   --  14  GLUCOSE 73  --   --  77  BUN 60*  --   --  33*  CREATININE 6.41*  --   --  3.83*  AST 30  --   --   --   ALT 24  --   --   --   ALKPHOS 134*  --   --   --   BILITOT 1.7*  --   --   --   ALBUMIN 3.1*  --   --  2.9*  PROCALCITON  --   --  0.26  --   BNP  --  16.6  --   --   CALCIUM 8.5*  --   --  8.2*    Radiology Reports DG Abd 1 View  Result Date: 10/03/2022 CLINICAL DATA:  Abdominal bloating/distension.  Pain. EXAM: ABDOMEN - 1 VIEW COMPARISON:  Pelvic radiograph 10/03/2022. Abdominal radiograph 09/18/2022. FINDINGS: Surgical clips and embolization coils are again noted in the right hemiabdomen. There is a single loop of mildly prominent small bowel in the left mid abdomen. No dilated loops of bowel are seen elsewhere. Gas and a small to moderate amount of stool are present in nondilated colon. Atherosclerotic vascular calcifications are noted. No acute osseous abnormality is seen. IMPRESSION: Single mildly prominent loop of small bowel in the left mid abdomen, nonspecific though localized inflammation/enteritis is possible. No definite evidence of bowel obstruction. Electronically Signed   By: Logan Bores M.D.   On: 10/03/2022 08:27   DG Pelvis 1-2 Views  Result Date: 10/03/2022 CLINICAL  DATA:  Fall with pain. EXAM: PELVIS - 1-2 VIEW COMPARISON:  None Available. FINDINGS: There is no evidence of pelvic fracture or diastasis. Degenerative changes are noted at the sacroiliac joints bilaterally. Vascular calcifications are present in the pelvis and bilateral lower extremities. Surgical changes are noted in the mid right abdomen. IMPRESSION: No acute fracture or dislocation. Electronically Signed   By:  Brett Fairy M.D.   On: 10/03/2022 00:20   DG Chest Port 1 View  Result Date: 10/02/2022 CLINICAL DATA:  61 year old male with shortness of breath, fatigue, abdominal distension. Hepatitis. EXAM: PORTABLE CHEST 1 VIEW COMPARISON:  Portable chest 09/18/2022 and earlier. FINDINGS: Portable AP upright view at 0353 hours. Extubated and enteric tube removed. Stable cardiomegaly and mediastinal contours. Stable lung volumes. Patchy and platelike opacity persists in the right lower lung. No superimposed pneumothorax, pulmonary edema or pleural effusion. Stable ventilation elsewhere. Paucity of bowel gas in the visible abdomen. No acute osseous abnormality identified. IMPRESSION: 1. Extubated and enteric tube removed. 2. Stable cardiomegaly, and confluent although plate-like opacity in the right lower lung which more resembles atelectasis than pneumonia. No edema or effusion is evident. Electronically Signed   By: Genevie Ann M.D.   On: 10/02/2022 04:17      Signature  -   Lala Lund M.D on 10/04/2022 at 8:49 AM   -  To page go to [email or web address]

## 2022-10-04 NOTE — Progress Notes (Signed)
Pt refusing q4hour bedside glucose checks.

## 2022-10-04 NOTE — Consult Note (Addendum)
Cardiology Consultation   Patient ID: Timothy [name] MRN: [id number]; DOB: [birth year]  Admit date: 10/02/2022 Date of Consult: 10/04/2022  PCP:  [name], MD   [facility] Cardiologist:  Glenetta Hew, MD    Patient Profile:   Timothy [name] is a 61 y.o. male with a hx of ESRD on HD (TTS), chronic combined systolic and diastolic heart failure, HTN, colon cancer, BPH, GI bleed, history of substance abuse (alcohol, heroin) who is being seen 10/04/2022 for the evaluation of CHF at the request of Dr. Candiss Norse.  History of Present Illness:   Mr. Timothy [name] is a 61 year old male with above medical history who is followed by Dr. Ellyn Hack. Per chart review, patient established care with cardiology during an admission in 2017 for evaluation of elevated troponin. Echocardiogram that admission on 07/18/16 showed EF 60-65%, no regional wall motion abnormalities, grade II diastolic dysfunction. Seen by nephrology, started HD in 09/2016.   Patient again seen by cardiology in 09/2021 when patient was admitted tot the hospital for evaluation of CHF. Patient presented complaining of PND, orthopnea, DOE, and ankle edema. Echocardiogram on 10/05/21 showed EF 40-45%, global hypokinesis, moderate LVH, low-normal RV systolic function, moderately elevated pulmonary artery systolic pressure, moderate mitral valve regurgitation, small pericardial effusion. Patient underwent HD, and BP medications were titrated. Recommended outpatient coronary CT vs nuclear stress test, but appears that patient did not follow up with cardiology as recommended. Also recommended repeat echo in 3 months for monitoring of valvular disease, but again patient did not follow up.   Patient was recently admitted to the hospital from 12/3 - 09/22/2022 for HTN urgency, flash pulmonary edema, RLL CAP, and RSV PNA. Patient was intubated due to respiratory distress, and required urgent dialysis. Noted that patient had been  leaving dialysis early and over weight, and had not been abiding fluid restrictions. He was treated with HD, antibiotics. Discharged on carvedilol, hydralazine, and diltiazem. Echocardiogram on 09/18/22 showed EF 40-45%, severe LVH, moderately reduced V systolic function, mild mitral valve regurgitation, mild tricuspid valve regurgitation.   Patient presented to the ED on 12/18 complaining of SOB and abdominal distention. BP 190/100. Started on Bi-PAP in the ED due to increased wob.  He reported that he had been drinking a lot of water and chewing ice. Also had not been taking his BP medications. He last went to HD on Saturday 12/16, but he left 8.5kg overweight. Labs on presentation significant for hsTn 192>242. Creatinine 6.41, potassium 4.4, hemoglobin 10.5. CXR showed stable cardiomegaly, confluent plate-like opacity in the right lower lung which more resembled atelectasis than pneumonia.   Patient underwent HD on 12/18, but signed out early due to cramping. Underwent HD again on 12/19. Continues to complain of SOB on exertion so is scheduled for HD again today. On interview today, patient reports that he continues to have shortness of breath on exertion this AM. Denies sob at rest. Has some discomfort in over his left chest and throughout his back, reports feeling like there are "needles on his skin". Denies chest pressure, tightness. Needle sensation not worse with exertion or relieved by rest. Denies having chest pain prior to today, and did not have chest pain on exertion recently.   Past Medical History:  Diagnosis Date   Acute metabolic encephalopathy 42/70/6237   Anemia of chronic kidney failure    BPH (benign prostatic hyperplasia)    Colon cancer [facility]) 2014   spouse states he had surgury for colon CA   End  stage renal disease on dialysis [facility]) 2017   TTHSat   Hepatitis C    treated   Hypertension    Hypertensive crisis 04/07/2022   Hypertensive heart disease with chronic diastolic  congestive heart failure ([place]) 07/17/2016   Polysubstance abuse ([place])    History of heroin and marijuana use   Thoracic aortic aneurysm ([place])    followed by Dr. Ellyn Hack    Past Surgical History:  Procedure Laterality Date   A/V FISTULAGRAM Left 12/02/2021   Procedure: A/V Fistulagram;  Surgeon: Cherre Robins, MD;  Location: [place] CV LAB;  Service: Cardiovascular;  Laterality: Left;   A/V FISTULAGRAM Left 04/24/2022   Procedure: A/V Fistulagram;  Surgeon: Waynetta Sandy, MD;  Location: [place] CV LAB;  Service: Cardiovascular;  Laterality: Left;   ABDOMINAL SURGERY     AV FISTULA PLACEMENT Left 09/19/2016   Procedure: Left arm Radiocephalic ARTERIOVENOUS (AV) FISTULA CREATION;  Surgeon: Conrad Kooskia, MD;  Location: [place];  Service: Vascular;  Laterality: Left;   BASCILIC VEIN TRANSPOSITION Left 07/09/2017   Procedure: BRACHIOCEPHALIC FISTULA CREATION;  Surgeon: Conrad Pepin, MD;  Location: [place];  Service: Vascular;  Laterality: Left;   BIOPSY  07/05/2022   Procedure: BIOPSY;  Surgeon: Thornton Park, MD;  Location: [place];  Service: Gastroenterology;;   COLON SURGERY  2014   COLONOSCOPY N/A 07/12/2022   Procedure: COLONOSCOPY;  Surgeon: Doran Stabler, MD;  Location: [place];  Service: Gastroenterology;  Laterality: N/A;   COLONOSCOPY WITH PROPOFOL N/A 07/05/2022   Procedure: COLONOSCOPY WITH PROPOFOL;  Surgeon: Thornton Park, MD;  Location: [place];  Service: Gastroenterology;  Laterality: N/A;   ENTEROSCOPY N/A 07/28/2022   Procedure: ENTEROSCOPY;  Surgeon: [name], MD;  Location: [facility] ENDOSCOPY;  Service: Gastroenterology;  Laterality: N/A;   ESOPHAGOGASTRODUODENOSCOPY (EGD) WITH PROPOFOL N/A 11/05/2021   Procedure: ESOPHAGOGASTRODUODENOSCOPY (EGD) WITH PROPOFOL;  Surgeon: Carol Ada, MD;  Location: [place];  Service: Endoscopy;  Laterality: N/A;   ESOPHAGOGASTRODUODENOSCOPY (EGD) WITH PROPOFOL N/A 11/14/2021   Procedure:  ESOPHAGOGASTRODUODENOSCOPY (EGD) WITH PROPOFOL;  Surgeon: [name], MD;  Location: [place];  Service: Gastroenterology;  Laterality: N/A;   ESOPHAGOGASTRODUODENOSCOPY (EGD) WITH PROPOFOL N/A 11/16/2021   Procedure: ESOPHAGOGASTRODUODENOSCOPY (EGD) WITH PROPOFOL;  Surgeon: [name], MD;  Location: [place];  Service: Gastroenterology;  Laterality: N/A;   ESOPHAGOGASTRODUODENOSCOPY (EGD) WITH PROPOFOL N/A 07/05/2022   Procedure: ESOPHAGOGASTRODUODENOSCOPY (EGD) WITH PROPOFOL;  Surgeon: Thornton Park, MD;  Location: [place];  Service: Gastroenterology;  Laterality: N/A;   ESOPHAGOGASTRODUODENOSCOPY (EGD) WITH PROPOFOL N/A 07/12/2022   Procedure: ESOPHAGOGASTRODUODENOSCOPY (EGD) WITH PROPOFOL;  Surgeon: Doran Stabler, MD;  Location: [place];  Service: Gastroenterology;  Laterality: N/A;   ESOPHAGOGASTRODUODENOSCOPY (EGD) WITH PROPOFOL N/A 07/31/2022   Procedure: ESOPHAGOGASTRODUODENOSCOPY (EGD) WITH PROPOFOL;  Surgeon: Ladene Artist, MD;  Location: [place];  Service: Gastroenterology;  Laterality: N/A;   ESOPHAGOGASTRODUODENOSCOPY (EGD) WITH PROPOFOL N/A 08/13/2022   Procedure: ESOPHAGOGASTRODUODENOSCOPY (EGD) WITH PROPOFOL;  Surgeon: Gatha Mayer, MD;  Location: [place];  Service: Gastroenterology;  Laterality: N/A;   HEMOSTASIS CLIP PLACEMENT  11/16/2021   Procedure: HEMOSTASIS CLIP PLACEMENT;  Surgeon: [name], MD;  Location: [facility] ENDOSCOPY;  Service: Gastroenterology;;   HEMOSTASIS CLIP PLACEMENT  07/05/2022   Procedure: HEMOSTASIS CLIP PLACEMENT;  Surgeon: Thornton Park, MD;  Location: [place];  Service: Gastroenterology;;   HEMOSTASIS CLIP PLACEMENT  08/13/2022   Procedure: HEMOSTASIS CLIP PLACEMENT;  Surgeon: Gatha Mayer, MD;  Location: [facility]  ENDOSCOPY;  Service: Gastroenterology;;   HEMOSTASIS CONTROL  11/05/2021   Procedure: HEMOSTASIS CONTROL;  Surgeon: Carol Ada, MD;  Location: [place];  Service: Endoscopy;;    HEMOSTASIS CONTROL  11/16/2021   Procedure: HEMOSTASIS CONTROL;  Surgeon: [name], MD;  Location: [facility] ENDOSCOPY;  Service: Gastroenterology;;   HEMOSTASIS CONTROL  07/05/2022   Procedure: HEMOSTASIS CONTROL;  Surgeon: Thornton Park, MD;  Location: [place];  Service: Gastroenterology;;   HOT HEMOSTASIS N/A 11/16/2021   Procedure: HOT HEMOSTASIS (ARGON PLASMA COAGULATION/BICAP);  Surgeon: [name], MD;  Location: [place];  Service: Gastroenterology;  Laterality: N/A;   HOT HEMOSTASIS N/A 08/13/2022   Procedure: HOT HEMOSTASIS (ARGON PLASMA COAGULATION/BICAP);  Surgeon: Gatha Mayer, MD;  Location: [place];  Service: Gastroenterology;  Laterality: N/A;   INSERTION OF DIALYSIS CATHETER N/A 09/19/2016   Procedure: INSERTION OF TUNNELED DIALYSIS CATHETER;  Surgeon: Conrad Edgerton, MD;  Location: [place];  Service: Vascular;  Laterality: N/A;   IR ANGIOGRAM SELECTIVE EACH ADDITIONAL VESSEL  11/16/2021   IR ANGIOGRAM VISCERAL SELECTIVE  11/05/2021   IR ANGIOGRAM VISCERAL SELECTIVE  11/16/2021   IR ANGIOGRAM VISCERAL SELECTIVE  11/16/2021   IR EMBO ART  VEN HEMORR LYMPH EXTRAV  INC GUIDE ROADMAPPING  11/05/2021   IR EMBO ART  VEN HEMORR LYMPH EXTRAV  INC GUIDE ROADMAPPING  11/16/2021   IR GENERIC HISTORICAL  09/14/2016   IR US GUIDE VASC ACCESS RIGHT 09/14/2016 [name], DO [facility]-INTER[facility]   IR GENERIC HISTORICAL  09/14/2016   IR FLUORO GUIDE CV LINE RIGHT 09/14/2016 [name], DO [facility]   IR PARACENTESIS  06/22/2021   IR US GUIDE [place] RIGHT  11/05/2021   IR US GUIDE [place] RIGHT  11/16/2021   LAPAROTOMY N/A 05/23/2021   Procedure: EXPLORATORY LAPAROTOMY LYSIS ADHESIONS;  Surgeon: [name], MD;  Location: [place];  Service: General;  Laterality: N/A;   LAPAROTOMY N/A 11/20/2021   Procedure: EXPLORATORY LAPAROTOMY, REPAIR OF BLEEDING DUODENAL ULCER;  Surgeon: Dwan Bolt, MD;  Location: [place];  Service: General;  Laterality: N/A;   ORIF TIBIA PLATEAU Left  01/15/2018   Procedure: OPEN REDUCTION INTERNAL FIXATION (ORIF) TIBIAL PLATEAU;  Surgeon: Altamese Conne[place]ville, MD;  Location: [place];  Service: Orthopedics;  Laterality: Left;   REVISON OF ARTERIOVENOUS FISTULA Left 05/26/2022   Procedure: REVISION OF LEFT ARM ARTERIOVENOUS FISTULA WITH PLICATION OF PSEUDOANEURYSM;  Surgeon: Waynetta Sandy, MD;  Location: [place];  Service: Vascular;  Laterality: Left;   SCLEROTHERAPY  11/05/2021   Procedure: Clide Deutscher;  Surgeon: Carol Ada, MD;  Location: [place];  Service: Endoscopy;;   SCLEROTHERAPY  11/16/2021   Procedure: Clide Deutscher;  Surgeon: [name], MD;  Location: [facility] ENDOSCOPY;  Service: Gastroenterology;;   TRANSTHORACIC ECHOCARDIOGRAM  07/2016    EF 60-65%, No RWMA. Mod Concentric LVH - Gr 2 DD. Severe LA dilation. PAP ~35 mmHg (mild Pulm HTN)  --> no changes noted 1 month later     Home Medications:  Prior to Admission medications   Medication Sig Start Date End Date Taking? Authorizing Provider  albuterol (VENTOLIN HFA) 108 (90 Base) MCG/ACT inhaler Inhale 2 puffs into the lungs every 6 (six) hours as needed for wheezing or shortness of breath. 01/06/22  Yes [provider]  carvedilol (COREG) 6.25 MG tablet Take 1 tablet (6.25 mg total) by mouth 2 (two) times daily with a meal. 09/22/22 10/22/22 Yes Patrecia Pour, MD  cetirizine (ZYRTEC) 10 MG tablet Take 10  mg by mouth daily. 09/05/22  Yes [provider]  diltiazem (CARDIZEM CD) 360 MG 24 hr capsule Take 1 capsule (360 mg total) by mouth daily. 08/03/22 10/02/22 Yes Arrien, Jimmy Picket, MD  DULoxetine (CYMBALTA) 20 MG capsule Take 20 mg by mouth daily. 01/06/22  Yes [provider]  fluticasone (FLONASE) 50 MCG/ACT nasal spray Place 1 spray into both nostrils 2 (two) times daily. 09/07/22  Yes [provider]  hydrALAZINE (APRESOLINE) 50 MG tablet Take 1 tablet (50 mg total) by mouth 2 (two) times daily. 09/22/22 10/22/22 Yes Patrecia Pour, MD   lidocaine (LMX) 4 % cream Apply 1 Application topically 3 (three) times a week. 06/27/22  Yes [provider]  LIDOCAINE-PRILOCAINE EX Apply 1 application  topically Every Tuesday,Thursday,and Saturday with dialysis.   Yes [provider]  losartan (COZAAR) 25 MG tablet Take 25 mg by mouth daily. 09/22/22  Yes [provider]  montelukast (SINGULAIR) 10 MG tablet Take 10 mg by mouth at bedtime. 08/24/22  Yes [provider]  oxyCODONE (OXY IR/ROXICODONE) 5 MG immediate release tablet Take 5 mg by mouth 3 (three) times daily as needed for moderate pain or severe pain. 08/09/22  Yes [provider]  pantoprazole (PROTONIX) 40 MG tablet Take 1 tablet (40 mg total) by mouth 2 (two) times daily before a meal. 08/14/22  Yes Lavina Hamman, MD  sevelamer carbonate (RENVELA) 800 MG tablet Take 1,600 mg by mouth 4 (four) times daily. 09/15/22  Yes [provider]  SYMBICORT 160-4.5 MCG/ACT inhaler SMARTSIG:2 Puff(s) By Mouth 09/22/22  Yes [provider]    Inpatient Medications: Scheduled Meds:  aspirin  81 mg Oral Daily   azithromycin  500 mg Oral Daily   carvedilol  6.25 mg Oral BID WC   Chlorhexidine Gluconate Cloth  6 each Topical Q0600   Chlorhexidine Gluconate Cloth  6 each Topical Q0600   diltiazem  360 mg Oral Daily   DULoxetine  20 mg Oral Daily   heparin  5,000 Units Subcutaneous Q8H   hydrALAZINE  50 mg Oral Q8H   losartan  12.5 mg Oral Daily   montelukast  10 mg Oral QHS   nicotine  14 mg Transdermal Daily   pantoprazole  40 mg Oral BID AC   rosuvastatin  10 mg Oral Daily   sevelamer carbonate  2,400 mg Oral TID WC   sodium chloride flush  3 mL Intravenous Q12H   Continuous Infusions:  PRN Meds: acetaminophen **OR** acetaminophen, albuterol, dextrose, hydrALAZINE, oxyCODONE  Allergies:   No Known Allergies  Social History:   Social History   Socioeconomic History   Marital status: Married    Spouse name: Not on  file   Number of children: Not on file   Years of education: Not on file   Highest education level: Not on file  Occupational History   Occupation: disabled  Tobacco Use   Smoking status: Former    Packs/day: 0.25    Types: Cigarettes    Quit date: 05/12/2022    Years since quitting: 0.3    Passive exposure: Never   Smokeless tobacco: Never  Vaping Use   Vaping Use: Never used  Substance and Sexual Activity   Alcohol use: No    Alcohol/week: 7.0 standard drinks of alcohol    Types: 7 Cans of beer per week   Drug use: Yes    Frequency: 1.0 times per week    Types: Marijuana, Heroin    Comment:  last time 05/25/22   Sexual activity: Yes    Comment: once a week  Other Topics Concern   Not on file  Social History Narrative   Not on file   Social Determinants of Health   Financial Resource Strain: Not on file  Food Insecurity: No Food Insecurity (09/21/2022)   Hunger Vital Sign    Worried About Running Out of Food in the Last Year: Never true    Ran Out of Food in the Last Year: Never true  Transportation Needs: No Transportation Needs (09/21/2022)   PRAPARE - Hydrologist (Medical): No    Lack of Transportation (Non-Medical): No  Physical Activity: Not on file  Stress: Not on file  Social Connections: Not on file  Intimate Partner Violence: Not At Risk (09/21/2022)   Humiliation, Afraid, Rape, and Kick questionnaire    Fear of Current or Ex-Partner: No    Emotionally Abused: No    Physically Abused: No    Sexually Abused: No    Family History:    Family History  Problem Relation Age of Onset   Heart failure Mother        Died at age 46.   Heart attack Mother 54   Hypertension Mother    Diabetes Mellitus II Mother    Other Father        Unknown   Kidney failure Sister        (Oldest Sister)   Other Other        Multiple siblings have started her heart disease, he is not sure of the details.   CAD Nephew      ROS:  Please see the  history of present illness.   All other ROS reviewed and negative.     Physical Exam/Data:   Vitals:   10/03/22 2102 10/04/22 0030 10/04/22 0400 10/04/22 0728  BP: (!) 151/96  138/88 (!) 138/91  Pulse:   72 72  Resp:   18 18  Temp:   98 F (36.7 C) (!) 97.3 F (36.3 C)  TempSrc:   Oral Oral  SpO2:   92% 96%  Weight:  76.7 kg    Height:        Intake/Output Summary (Last 24 hours) at 10/04/2022 1026 Last data filed at 10/03/2022 1918 Gross per 24 hour  Intake --  Output 2300 ml  Net -2300 ml      10/04/2022   12:30 AM 10/02/2022   11:06 PM 09/22/2022    5:00 AM  Last 3 Weights  Weight (lbs) 169 lb 159 lb 2.8 oz 159 lb 2.8 oz  Weight (kg) 76.658 kg 72.2 kg 72.2 kg     Body mass index is 22.3 kg/m.  General:  Well nourished, well developed, in no acute distress. Sitting on the edge of the bed  HEENT: normal Neck: no JVD Vascular: Radial pulses 2+ bilaterally Cardiac:  normal S1, S2; RRR; grade 2/6 systolic murmur  Lungs: crackles in bilateral lung bases. Slightly increased WOB on [place], becomes short of breath while speaking  Abd: soft, nontender, no hepatomegaly  Ext: no edema in BLE Musculoskeletal:  No deformities, BUE and BLE strength normal and equal Skin: warm and dry-- dry skin noted over chest and back  Neuro:  CNs 2-12 intact, no focal abnormalities noted Psych:  Normal affect   EKG:  The EKG was personally reviewed and demonstrates: Sinus tachycardia, HR 107 BPM. Nonspecific ST and T wave abnormality  Telemetry:  Telemetry  was personally reviewed and demonstrates:  NSR   Relevant CV Studies:  Echocardiogram 09/18/22 1. Left ventricular ejection fraction, by estimation, is 40 to 45%. The  left ventricle has mildly decreased function. The left ventricular  internal cavity size was moderately dilated. There is severe concentric  left ventricular hypertrophy.   2. Right ventricular systolic function is moderately reduced. The right  ventricular size is  mildly enlarged. Mildly increased right ventricular  wall thickness.   3. Left atrial size was mildly dilated.   4. Right atrial size was mildly dilated.   5. Mild mitral valve regurgitation.   6. AV is thickened, calcified Appears functionally bicuspid. Peak and  mean gradients through the valve are 7 and 4 mm Hg respectively. AVA (VTI)  is 3.22 cm2. DVI is 0.85 consistent with mild AS.Marland Kitchen The aortic valve is  bicuspid. Aortic valve regurgitation  is mild.   7. Aortic dilatation noted. There is moderate dilatation of the ascending  aorta, measuring 43 mm.   8. The inferior vena cava is normal in size with greater than 50%  respiratory variability, suggesting right atrial pressure of 3 mmHg.    Laboratory Data:  High Sensitivity Troponin:   Recent Labs  Lab 09/18/22 0002 09/18/22 0315 10/02/22 0346 10/02/22 0810  TROPONINIHS 126* 124* 192* 242*     Chemistry Recent Labs  Lab 10/02/22 0346 10/03/22 1955  NA 134* 135  K 4.4 3.5  CL 99 94*  CO2 19* 27  GLUCOSE 73 77  BUN 60* 33*  CREATININE 6.41* 3.83*  CALCIUM 8.5* 8.2*  GFRNONAA 9* 17*  ANIONGAP 16* 14    Recent Labs  Lab 10/02/22 0346 10/03/22 1955  PROT 7.3  --   ALBUMIN 3.1* 2.9*  AST 30  --   ALT 24  --   ALKPHOS 134*  --   BILITOT 1.7*  --    Lipids No results for input(s): "CHOL", "TRIG", "HDL", "LABVLDL", "LDLCALC", "CHOLHDL" in the last 168 hours.  Hematology Recent Labs  Lab 10/02/22 0346 10/03/22 1959  WBC 7.6 4.8  RBC 3.67* 3.62*  HGB 10.5* 10.3*  HCT 32.1* 31.1*  MCV 87.5 85.9  MCH 28.6 28.5  MCHC 32.7 33.1  RDW 17.3* 16.6*  PLT 177 149*   Thyroid No results for input(s): "TSH", "FREET4" in the last 168 hours.  BNP Recent Labs  Lab 10/02/22 0437  BNP 16.6    DDimer No results for input(s): "DDIMER" in the last 168 hours.   Radiology/Studies:  DG Abd 1 View  Result Date: 10/03/2022 CLINICAL DATA:  Abdominal bloating/distension.  Pain. EXAM: ABDOMEN - 1 VIEW COMPARISON:   Pelvic radiograph 10/03/2022. Abdominal radiograph 09/18/2022. FINDINGS: Surgical clips and embolization coils are again noted in the right hemiabdomen. There is a single loop of mildly prominent small bowel in the left mid abdomen. No dilated loops of bowel are seen elsewhere. Gas and a small to moderate amount of stool are present in nondilated colon. Atherosclerotic vascular calcifications are noted. No acute osseous abnormality is seen. IMPRESSION: Single mildly prominent loop of small bowel in the left mid abdomen, nonspecific though localized inflammation/enteritis is possible. No definite evidence of bowel obstruction. Electronically Signed   By: [place] Bores M.D.   On: 10/03/2022 08:27   DG Pelvis 1-2 Views  Result Date: 10/03/2022 CLINICAL DATA:  Fall with pain. EXAM: PELVIS - 1-2 VIEW COMPARISON:  None Available. FINDINGS: There is no evidence of pelvic fracture or diastasis. Degenerative changes are noted at the  sacroiliac joints bilaterally. Vascular calcifications are present in the pelvis and bilateral lower extremities. Surgical changes are noted in the mid right abdomen. IMPRESSION: No acute fracture or dislocation. Electronically Signed   By: Brett Fairy M.D.   On: 10/03/2022 00:20   DG Chest Port 1 View  Result Date: 10/02/2022 CLINICAL DATA:  61 year old male with shortness of breath, fatigue, abdominal distension. Hepatitis. EXAM: PORTABLE CHEST 1 VIEW COMPARISON:  Portable chest 09/18/2022 and earlier. FINDINGS: Portable AP upright view at 0353 hours. Extubated and enteric tube removed. Stable cardiomegaly and mediastinal contours. Stable lung volumes. Patchy and platelike opacity persists in the right lower lung. No superimposed pneumothorax, pulmonary edema or pleural effusion. Stable ventilation elsewhere. Paucity of bowel gas in the visible abdomen. No acute osseous abnormality identified. IMPRESSION: 1. Extubated and enteric tube removed. 2. Stable cardiomegaly, and confluent  although plate-like opacity in the right lower lung which more resembles atelectasis than pneumonia. No edema or effusion is evident. Electronically Signed   By: Genevie Ann M.D.   On: 10/02/2022 04:17     Assessment and Plan:   Acute on Chronic Combined Systolic and Diastolic Heart Failure ESRD on HD Mild aortic dilation  - Most recent echo from 09/18/22 showed EF 40-45%, severe LVH, moderately reduced RV systolic function.  - Patient presented complaining of SOB, orthopnea, ankle edema - Per nephrology, patient went to HD on Saturday 12/16 and left 8.5 kg over weight - Had HD on 12/18, but left early due to cramping. Again had HD on 12/19 but continues to have SOB on exertion  - Scheduled for HD today. Volume status managed by HD  - When seen by cardiology in 09/2021, recommended nuclear stress test for ischemic evaluation. Patient did not follow up and study was not completed  - Given reduced EF, multiple risk factors for CAD (history of tobacco use, HTN, ESRD) patient should have an ischemic evaluation.  - Primary team ordered losartan this AM - Continue carvedilol  - Further GDMT limited by ESRD   HTN Urgency  - Patient admitted that he had not been taking his BP medications prior to admission - BP initially elevated up to 187/130 - Now on carvedilol 6.25 mg BID, diltiazem 360 mg BID, hydralazine 50 mg TID, losartan 12.5 mg daily  - BP improving   Troponin Elevation  - hsTn 192>242 - Patient does mention chest discomfort, but describes it as a "needle sensation" over his chest and back. Not associated with exertion or relieved by rest. Has dry skin over chest and back  - Suspect that trop elevation is demand ischemia in the setting of HTN urgency, volume overload, and ESRD - Consider ischemic eval for reduced EF as above  Otherwise per primary  - Anemia of CKD  - Ansiety and depression  - Hypoglycemia    Risk Assessment/Risk Scores:    New York Heart Association (NYHA)  Functional Class NYHA Class IV      For questions or updates, please contact [place] Please consult [email or web address] for contact info under    Signed, Miraj Truss, PA-C  10/04/2022 10:26 AM  Personally seen and examined. Agree with APP above with the following comments: 61 yo M with ESRD, HFrEF and HTN with volume overload in the setting of dietary indiscretion. Patient notes to me that he he's SOB when he eats good. No CP on my exam but has noted CP with others. Planed for HD with potential DC later today.  Exam notable  for crackles in the cases.  Systolic murmur  EKG sinus tachycardia Tele: SR  Would recommend  - agree with HD and BP control - If symptoms improves with BP control and volume removal agree with  - losartan per primary/nephrology; I tend not to use it in ESRD populations and may favor increase in hydralazine if needed once euvolemic - discordance in testing of his aortic valve; I suspect tri-leaflet with calcium; an outpatient CCTA would be able to comment on valve morphology and aortic diameter size.  Would need to be seen in clinic before ordering given concerns for loose follow up  Rudean Haskell, MD [facility], [place] [address] [phone]  1:45 PM

## 2022-10-04 NOTE — Progress Notes (Addendum)
   10/04/22 1818  Vitals  Temp (!) 97.5 F (36.4 C)  Temp Source Oral  BP (!) 134/96  MAP (mmHg) 107  BP Location Right Arm  BP Method Automatic  Patient Position (if appropriate) Lying  Pulse Rate 65  Pulse Rate Source Monitor  ECG Heart Rate 67  Resp (!) 24  Oxygen Therapy  SpO2 94 %  O2 Device Room Air  Pulse Oximetry Type Continuous   Received patient in bed to unit.  Alert and oriented.  Informed consent signed and in chart.   Treatment initiated: 1440 Treatment completed: 1803   Patient tolerated well.  Transported back to the room  Alert, without acute distress.  Hand-off given to patient's nurse.   Access used: AVF Access issues: NA  Total UF removed: 3058m Medication(s) given:Heparin 1500 units bolus Post HD VS: see above Post HD weight: 76.7kg   [facility]

## 2022-10-04 NOTE — Progress Notes (Signed)
Patient ID: Timothy [name], male   DOB: 17-Jan-1961, 61 y.o.   MRN: [id number] S: Still feels short of breath this morning and would like another session of HD.  O:BP (!) 138/91 (BP Location: Right Arm)   Pulse 72   Temp (!) 97.3 F (36.3 C) (Oral)   Resp 18   Ht '6\' 1"'$  (1.854 m)   Wt 76.7 kg   SpO2 96%   BMI 22.30 kg/m   Intake/Output Summary (Last 24 hours) at 10/04/2022 1010 Last data filed at 10/03/2022 1918 Gross per 24 hour  Intake --  Output 2300 ml  Net -2300 ml   Intake/Output: I/O last 3 completed shifts: In: 169.5 [IV Piggyback:169.5] Out: 2300 [Other:2300]  Intake/Output this shift:  No intake/output data recorded. Weight change: 4.458 kg Gen:NAD CVS: RRR Resp: scattered rhonchi bilaterally Abd: +BS, soft, NT/nD Ext: no edema, LUE AVF +T/B  Recent Labs  Lab 10/02/22 0346 10/03/22 1955  NA 134* 135  K 4.4 3.5  CL 99 94*  CO2 19* 27  GLUCOSE 73 77  BUN 60* 33*  CREATININE 6.41* 3.83*  ALBUMIN 3.1* 2.9*  CALCIUM 8.5* 8.2*  PHOS  --  3.6  AST 30  --   ALT 24  --    Liver Function Tests: Recent Labs  Lab 10/02/22 0346 10/03/22 1955  AST 30  --   ALT 24  --   ALKPHOS 134*  --   BILITOT 1.7*  --   PROT 7.3  --   ALBUMIN 3.1* 2.9*   No results for input(s): "LIPASE", "AMYLASE" in the last 168 hours. No results for input(s): "AMMONIA" in the last 168 hours. CBC: Recent Labs  Lab 10/02/22 0346 10/03/22 1959  WBC 7.6 4.8  HGB 10.5* 10.3*  HCT 32.1* 31.1*  MCV 87.5 85.9  PLT 177 149*   Cardiac Enzymes: No results for input(s): "CKTOTAL", "CKMB", "CKMBINDEX", "TROPONINI" in the last 168 hours. CBG: Recent Labs  Lab 10/02/22 1124 10/02/22 2023 10/03/22 2051 10/04/22 0015  GLUCAP 61* 77 99 131*    Iron Studies: No results for input(s): "IRON", "TIBC", "TRANSFERRIN", "FERRITIN" in the last 72 hours. Studies/Results: DG Abd 1 View  Result Date: 10/03/2022 CLINICAL DATA:  Abdominal bloating/distension.  Pain. EXAM: ABDOMEN - 1 VIEW  COMPARISON:  Pelvic radiograph 10/03/2022. Abdominal radiograph 09/18/2022. FINDINGS: Surgical clips and embolization coils are again noted in the right hemiabdomen. There is a single loop of mildly prominent small bowel in the left mid abdomen. No dilated loops of bowel are seen elsewhere. Gas and a small to moderate amount of stool are present in nondilated colon. Atherosclerotic vascular calcifications are noted. No acute osseous abnormality is seen. IMPRESSION: Single mildly prominent loop of small bowel in the left mid abdomen, nonspecific though localized inflammation/enteritis is possible. No definite evidence of bowel obstruction. Electronically Signed   By: Logan Bores M.D.   On: 10/03/2022 08:27   DG Pelvis 1-2 Views  Result Date: 10/03/2022 CLINICAL DATA:  Fall with pain. EXAM: PELVIS - 1-2 VIEW COMPARISON:  None Available. FINDINGS: There is no evidence of pelvic fracture or diastasis. Degenerative changes are noted at the sacroiliac joints bilaterally. Vascular calcifications are present in the pelvis and bilateral lower extremities. Surgical changes are noted in the mid right abdomen. IMPRESSION: No acute fracture or dislocation. Electronically Signed   By: Brett Fairy M.D.   On: 10/03/2022 00:20    aspirin  81 mg Oral Daily   azithromycin  500 mg Oral Daily  carvedilol  6.25 mg Oral BID WC   Chlorhexidine Gluconate Cloth  6 each Topical Q0600   Chlorhexidine Gluconate Cloth  6 each Topical Q0600   diltiazem  360 mg Oral Daily   DULoxetine  20 mg Oral Daily   heparin  5,000 Units Subcutaneous Q8H   hydrALAZINE  50 mg Oral Q8H   losartan  12.5 mg Oral Daily   montelukast  10 mg Oral QHS   nicotine  14 mg Transdermal Daily   pantoprazole  40 mg Oral BID AC   rosuvastatin  10 mg Oral Daily   sevelamer carbonate  2,400 mg Oral TID WC   sodium chloride flush  3 mL Intravenous Q12H    BMET    Component Value Date/Time   NA 135 10/03/2022 1955   K 3.5 10/03/2022 1955   CL 94  (L) 10/03/2022 1955   CO2 27 10/03/2022 1955   GLUCOSE 77 10/03/2022 1955   BUN 33 (H) 10/03/2022 1955   CREATININE 3.83 (H) 10/03/2022 1955   CREATININE 1.59 (H) 07/29/2015 0943   CALCIUM 8.2 (L) 10/03/2022 1955   CALCIUM 8.4 (L) 09/14/2016 1615   GFRNONAA 17 (L) 10/03/2022 1955   GFRNONAA 48 (L) 07/29/2015 0943   GFRAA 7 (L) 05/18/2019 2220   GFRAA 56 (L) 07/29/2015 0943   CBC    Component Value Date/Time   WBC 4.8 10/03/2022 1959   RBC 3.62 (L) 10/03/2022 1959   HGB 10.3 (L) 10/03/2022 1959   HCT 31.1 (L) 10/03/2022 1959   PLT 149 (L) 10/03/2022 1959   MCV 85.9 10/03/2022 1959   MCH 28.5 10/03/2022 1959   MCHC 33.1 10/03/2022 1959   RDW 16.6 (H) 10/03/2022 1959   LYMPHSABS 0.5 (L) 09/21/2022 0830   MONOABS 0.7 09/21/2022 0830   EOSABS 0.1 09/21/2022 0830   BASOSABS 0.0 09/21/2022 0830    Dialysis Orders: Center: [facility]  on TTS . 180NRe 3 hr 45 min BFR 450 DFR Auto 1.5 EDW 70.5kg 2K 2 Ca AVF 15g  No heparin Mircera 28mg IV q 2 weeks Hectorol 4 mcg IV q HD   Home Bp meds: carvedilol 6.'25mg'$  BID, diltiazem '360mg'$  QD, hydralazine '50mg'$  BID   Assessment/Plan:  Shortness of breath: CXR without edema but patient does appear volume overloaded and left his last HD 8.5kg over his EDW. He has also not been taking his antihypertensive medications. Had HD yesterday but signed off early against medical advise, still had 3L UF. fortunately respiratory status is improved. Needs ongoing counseling regarding adherence for fluid restrictions and medications.  Will plan for HD again today as he remains 6 kg above edw and remains SOB.  ESRD:  TTS schedule but extra HD today, see above. Resume TTS schedule today.   Hypertension/volume: BP improved with home meds and HD. Needs further volume removal, 3L UF goal with HD today.   Anemia: Hgb at goal. Not due for ESA yet.   Metabolic bone disease: Calcium at goal.  Continue VDRA and phosphorus binders (renvela 3 tabs per meal)  Nutrition:   Albumin 3.1, Will need renal diet and strict fluid restrictions once tolerating PO Disposition - hopefully he can be discharged after HD today.  JDonetta Potts MD [facility]

## 2022-10-05 ENCOUNTER — Inpatient Hospital Stay (HOSPITAL_COMMUNITY): Payer: Medicare Other

## 2022-10-05 ENCOUNTER — Other Ambulatory Visit (HOSPITAL_COMMUNITY): Payer: Self-pay

## 2022-10-05 LAB — CBC WITH DIFFERENTIAL/PLATELET
Abs Immature Granulocytes: 0.02 10*3/uL (ref 0.00–0.07)
Basophils Absolute: 0 10*3/uL (ref 0.0–0.1)
Basophils Relative: 1 %
Eosinophils Absolute: 0 10*3/uL (ref 0.0–0.5)
Eosinophils Relative: 1 %
HCT: 30.9 % — ABNORMAL LOW (ref 39.0–52.0)
Hemoglobin: 10.4 g/dL — ABNORMAL LOW (ref 13.0–17.0)
Immature Granulocytes: 0 %
Lymphocytes Relative: 9 %
Lymphs Abs: 0.4 10*3/uL — ABNORMAL LOW (ref 0.7–4.0)
MCH: 28.7 pg (ref 26.0–34.0)
MCHC: 33.7 g/dL (ref 30.0–36.0)
MCV: 85.4 fL (ref 80.0–100.0)
Monocytes Absolute: 0.9 10*3/uL (ref 0.1–1.0)
Monocytes Relative: 20 %
Neutro Abs: 3.2 10*3/uL (ref 1.7–7.7)
Neutrophils Relative %: 69 %
Platelets: 150 10*3/uL (ref 150–400)
RBC: 3.62 MIL/uL — ABNORMAL LOW (ref 4.22–5.81)
RDW: 16.9 % — ABNORMAL HIGH (ref 11.5–15.5)
WBC: 4.6 10*3/uL (ref 4.0–10.5)
nRBC: 0 % (ref 0.0–0.2)

## 2022-10-05 LAB — RENAL FUNCTION PANEL
Albumin: 2.9 g/dL — ABNORMAL LOW (ref 3.5–5.0)
Anion gap: 12 (ref 5–15)
BUN: 34 mg/dL — ABNORMAL HIGH (ref 8–23)
CO2: 28 mmol/L (ref 22–32)
Calcium: 8 mg/dL — ABNORMAL LOW (ref 8.9–10.3)
Chloride: 93 mmol/L — ABNORMAL LOW (ref 98–111)
Creatinine, Ser: 5.08 mg/dL — ABNORMAL HIGH (ref 0.61–1.24)
GFR, Estimated: 12 mL/min — ABNORMAL LOW (ref >60)
Glucose, Bld: 105 mg/dL — ABNORMAL HIGH (ref 70–99)
Phosphorus: 4.4 mg/dL (ref 2.5–4.6)
Potassium: 3.6 mmol/L (ref 3.5–5.1)
Sodium: 133 mmol/L — ABNORMAL LOW (ref 135–145)

## 2022-10-05 LAB — CBC
HCT: 31 % — ABNORMAL LOW (ref 39.0–52.0)
Hemoglobin: 10.2 g/dL — ABNORMAL LOW (ref 13.0–17.0)
MCH: 28.5 pg (ref 26.0–34.0)
MCHC: 32.9 g/dL (ref 30.0–36.0)
MCV: 86.6 fL (ref 80.0–100.0)
Platelets: 153 10*3/uL (ref 150–400)
RBC: 3.58 MIL/uL — ABNORMAL LOW (ref 4.22–5.81)
RDW: 17.2 % — ABNORMAL HIGH (ref 11.5–15.5)
WBC: 3.5 10*3/uL — ABNORMAL LOW (ref 4.0–10.5)
nRBC: 0 % (ref 0.0–0.2)

## 2022-10-05 LAB — MAGNESIUM: Magnesium: 2 mg/dL (ref 1.7–2.4)

## 2022-10-05 LAB — HEPATITIS B SURFACE ANTIGEN: Hepatitis B Surface Ag: NONREACTIVE

## 2022-10-05 LAB — BRAIN NATRIURETIC PEPTIDE: B Natriuretic Peptide: 4280.1 pg/mL — ABNORMAL HIGH (ref 0.0–100.0)

## 2022-10-05 MED ORDER — ISOSORBIDE MONONITRATE ER 30 MG PO TB24
30.0000 mg | ORAL_TABLET | Freq: Every day | ORAL | 0 refills | Status: AC
  Filled 2022-10-05: qty 30, 30d supply, fill #0

## 2022-10-05 MED ORDER — ASPIRIN 81 MG PO CHEW
81.0000 mg | CHEWABLE_TABLET | Freq: Every day | ORAL | 0 refills | Status: AC
  Filled 2022-10-05: qty 30, 30d supply, fill #0

## 2022-10-05 MED ORDER — ROSUVASTATIN CALCIUM 10 MG PO TABS
10.0000 mg | ORAL_TABLET | Freq: Every day | ORAL | 0 refills | Status: AC
  Filled 2022-10-05: qty 30, 30d supply, fill #0

## 2022-10-05 MED ORDER — HYDRALAZINE HCL 50 MG PO TABS
50.0000 mg | ORAL_TABLET | Freq: Three times a day (TID) | ORAL | 0 refills | Status: AC
  Filled 2022-10-05 (×2): qty 90, 30d supply, fill #0

## 2022-10-05 MED ORDER — HEPARIN SODIUM (PORCINE) 1000 UNIT/ML DIALYSIS
20.0000 [IU]/kg | INTRAMUSCULAR | Status: DC | PRN
  Administered 2022-10-05: 1500 [IU] via INTRAVENOUS_CENTRAL
  Filled 2022-10-05: qty 2

## 2022-10-05 NOTE — Progress Notes (Addendum)
   10/05/22 1821  Vitals  Temp 97.9 F (36.6 C)  Temp Source Oral  BP 138/84  MAP (mmHg) 100  BP Location Right Arm  BP Method Automatic  Patient Position (if appropriate) Lying  Pulse Rate 63  Pulse Rate Source Monitor  ECG Heart Rate 67  Resp (!) 22  Oxygen Therapy  SpO2 94 %  O2 Device Nasal Cannula  O2 Flow Rate (L/min) 2 L/min  Pulse Oximetry Type Continuous   Received patient in bed to unit.  Alert and oriented.  Informed consent signed and in chart.   Treatment initiated: [place] Treatment completed: 4462  Patient tolerated well.  Transported back to the room --> pt refused to let me put him back on his tele box as he stated he was being discharged when he gets back to his room  Alert, without acute distress.  Hand-off given to patient's nurse.   Access used: AVF Access issues: NA  Total UF removed: 3000 ml Medication(s) given  Heparin 1500 units bolus Post HD VS: see above Post HD weight: 74.2kg   [facility]

## 2022-10-05 NOTE — Progress Notes (Signed)
Heart Failure Navigator Progress Note  Assessed for Heart & Vascular TOC clinic readiness.  Patient does not meet criteria due to ESRD on dialysis.   Navigator will sign off at this time.   Earnestine Leys, BSN, Clinical cytogeneticist Only

## 2022-10-05 NOTE — Progress Notes (Signed)
Progress Note  Patient Name: Timothy [name] Date of Encounter: 10/05/2022  Primary Cardiologist: Glenetta Hew, MD   Subjective   Overnight had HD. Now off O2.  Now with BP controlled. Feels much better.  Inpatient Medications    Scheduled Meds:  aspirin  81 mg Oral Daily   azithromycin  500 mg Oral Daily   carvedilol  6.25 mg Oral BID WC   Chlorhexidine Gluconate Cloth  6 each Topical Q0600   Chlorhexidine Gluconate Cloth  6 each Topical Q0600   diltiazem  360 mg Oral Daily   DULoxetine  20 mg Oral Daily   heparin  5,000 Units Subcutaneous Q8H   hydrALAZINE  50 mg Oral Q8H   losartan  12.5 mg Oral Daily   montelukast  10 mg Oral QHS   nicotine  14 mg Transdermal Daily   pantoprazole  40 mg Oral BID AC   rosuvastatin  10 mg Oral Daily   sevelamer carbonate  2,400 mg Oral TID WC   sodium chloride flush  3 mL Intravenous Q12H   Continuous Infusions:  PRN Meds: acetaminophen **OR** acetaminophen, albuterol, dextrose, hydrALAZINE, oxyCODONE   Vital Signs    Vitals:   10/04/22 2147 10/05/22 0458 10/05/22 0500 10/05/22 0641  BP: 127/76 130/87  126/84  Pulse:  64    Resp:      Temp:    98.7 F (37.1 C)  TempSrc:    Oral  SpO2:  94%    Weight:   74.7 kg   Height:        Intake/Output Summary (Last 24 hours) at 10/05/2022 0836 Last data filed at 10/05/2022 0500 Gross per 24 hour  Intake 418 ml  Output 3000 ml  Net -2582 ml   Filed Weights   10/04/22 1434 10/04/22 1818 10/05/22 0500  Weight: 79.7 kg 76.7 kg 74.7 kg    Telemetry    SR - Personally Reviewed  Physical Exam   Gen: no distress   Neck: No JVD Cardiac: No Rubs or Gallops, No murmur, RRR +2 radial pulses Respiratory: Clear to auscultation bilaterally, normal effort, normal  respiratory rate GI: Soft, nontender, non-distended  MS: No  edema;  moves all extremities Integument: Skin feels warm Neuro:  At time of evaluation, alert and oriented to person/place/time/situation    Labs     Chemistry Recent Labs  Lab 10/02/22 0346 10/03/22 1955 10/04/22 1511  NA 134* 135 134*  K 4.4 3.5 3.4*  CL 99 94* 94*  CO2 19* 27 27  GLUCOSE 73 77 96  BUN 60* 33* 38*  CREATININE 6.41* 3.83* 4.51*  CALCIUM 8.5* 8.2* 7.9*  PROT 7.3  --   --   ALBUMIN 3.1* 2.9* 2.8*  AST 30  --   --   ALT 24  --   --   ALKPHOS 134*  --   --   BILITOT 1.7*  --   --   GFRNONAA 9* 17* 14*  ANIONGAP 16* 14 13     Hematology Recent Labs  Lab 10/03/22 1959 10/04/22 1511 10/05/22 0107  WBC 4.8 3.8* 4.6  RBC 3.62* 3.57* 3.62*  HGB 10.3* 10.0* 10.4*  HCT 31.1* 30.6* 30.9*  MCV 85.9 85.7 85.4  MCH 28.5 28.0 28.7  MCHC 33.1 32.7 33.7  RDW 16.6* 16.9* 16.9*  PLT 149* 133* 150    Cardiac EnzymesNo results for input(s): "TROPONINI" in the last 168 hours. No results for input(s): "TROPIPOC" in the last 168 hours.   BNP  Recent Labs  Lab 10/02/22 0437 10/05/22 0107  BNP 16.6 4,280.1*     DDimer No results for input(s): "DDIMER" in the last 168 hours.   Radiology    DG Chest Port 1 View  Result Date: 10/05/2022 CLINICAL DATA:  Shortness of breath EXAM: PORTABLE CHEST 1 VIEW COMPARISON:  Three days ago FINDINGS: Cardiomegaly and vascular pedicle widening. Bands of opacity at the bases, greater on the right. No effusion or pneumothorax. IMPRESSION: 1. Mild increase in atelectasis on the right. 2. Cardiomegaly. Electronically Signed   By: Jorje Guild M.D.   On: 10/05/2022 06:44    Cardiac Studies   Cardiac Studies & Procedures       ECHOCARDIOGRAM  ECHOCARDIOGRAM COMPLETE 09/18/2022  Narrative ECHOCARDIOGRAM REPORT    Patient Name:   Timothy [name] Date of Exam: 09/18/2022 Medical Rec #:  [id number]       Height:       73.0 in Accession #:    [id number]      Weight:       155.6 lb Date of Birth:  Jun 21, 1961       BSA:          1.934 m Patient Age:    27 years        BP:           133/96 mmHg Patient Gender: M               HR:           66 bpm. Exam Location:   Inpatient  Procedure: 2D Echo, Color Doppler and Cardiac Doppler  Indications:    Elevated troponin  History:        Patient has prior history of Echocardiogram examinations, most recent 02/14/2022. CHF; Risk Factors:Dyslipidemia. DOE. CKD. Polysusbstance abuse.  Sonographer:    Clayton Lefort RDCS (AE) Referring Phys: [id number] Kayleen Memos   Sonographer Comments: Echo performed with patient supine and on artificial respirator. IMPRESSIONS   1. Left ventricular ejection fraction, by estimation, is 40 to 45%. The left ventricle has mildly decreased function. The left ventricular internal cavity size was moderately dilated. There is severe concentric left ventricular hypertrophy. 2. Right ventricular systolic function is moderately reduced. The right ventricular size is mildly enlarged. Mildly increased right ventricular wall thickness. 3. Left atrial size was mildly dilated. 4. Right atrial size was mildly dilated. 5. Mild mitral valve regurgitation. 6. AV is thickened, calcified Appears functionally bicuspid. Peak and mean gradients through the valve are 7 and 4 mm Hg respectively. AVA (VTI) is 3.22 cm2. DVI is 0.85 consistent with mild AS.Marland Kitchen The aortic valve is bicuspid. Aortic valve regurgitation is mild. 7. Aortic dilatation noted. There is moderate dilatation of the ascending aorta, measuring 43 mm. 8. The inferior vena cava is normal in size with greater than 50% respiratory variability, suggesting right atrial pressure of 3 mmHg.  FINDINGS Left Ventricle: Left ventricular ejection fraction, by estimation, is 40 to 45%. The left ventricle has mildly decreased function. The left ventricular internal cavity size was moderately dilated. There is severe concentric left ventricular hypertrophy.  Right Ventricle: The right ventricular size is mildly enlarged. Mildly increased right ventricular wall thickness. Right ventricular systolic function is moderately reduced.  Left Atrium: Left  atrial size was mildly dilated.  Right Atrium: Right atrial size was mildly dilated.  Pericardium: There is no evidence of pericardial effusion.  Mitral Valve: There is mild thickening of the mitral valve leaflet(s). Mild to moderate mitral annular  calcification. Mild mitral valve regurgitation.  Tricuspid Valve: The tricuspid valve is normal in structure. Tricuspid valve regurgitation is mild.  Aortic Valve: AV is thickened, calcified Appears functionally bicuspid. Peak and mean gradients through the valve are 7 and 4 mm Hg respectively. AVA (VTI) is 3.22 cm2. DVI is 0.85 consistent with mild AS. The aortic valve is bicuspid. Aortic valve regurgitation is mild. Aortic valve mean gradient measures 4.0 mmHg. Aortic valve peak gradient measures 7.2 mmHg. Aortic valve area, by VTI measures 3.22 cm.  Pulmonic Valve: The pulmonic valve was not well visualized. Pulmonic valve regurgitation is not visualized. No evidence of pulmonic stenosis.  Aorta: Aortic dilatation noted. There is moderate dilatation of the ascending aorta, measuring 43 mm.  Venous: The inferior vena cava is normal in size with greater than 50% respiratory variability, suggesting right atrial pressure of 3 mmHg.  IAS/Shunts: No atrial level shunt detected by color flow Doppler.   LEFT VENTRICLE PLAX 2D LVIDd:         5.30 cm      Diastology LVIDs:         4.20 cm      LV e' medial:    6.42 cm/s LV PW:         1.60 cm      LV E/e' medial:  12.0 LV IVS:        1.80 cm      LV e' lateral:   9.03 cm/s LVOT diam:     2.20 cm      LV E/e' lateral: 8.5 LV SV:         82 LV SV Index:   43 LVOT Area:     3.80 cm  LV Volumes (MOD) LV vol d, MOD A2C: 172.0 ml LV vol d, MOD A4C: 189.0 ml LV vol s, MOD A2C: 98.2 ml LV vol s, MOD A4C: 88.2 ml LV SV MOD A2C:     73.8 ml LV SV MOD A4C:     189.0 ml LV SV MOD BP:      85.4 ml  RIGHT VENTRICLE RV S prime:     9.90 cm/s TAPSE (M-mode): 1.6 cm  LEFT ATRIUM             Index         RIGHT ATRIUM           Index LA diam:        4.90 cm 2.53 cm/m   RA Area:     20.10 cm LA Vol (A2C):   62.1 ml 32.11 ml/m  RA Volume:   55.50 ml  28.69 ml/m LA Vol (A4C):   53.5 ml 27.66 ml/m LA Biplane Vol: 69.0 ml 35.67 ml/m AORTIC VALVE AV Area (Vmax):    3.38 cm AV Area (Vmean):   3.07 cm AV Area (VTI):     3.22 cm AV Vmax:           134.00 cm/s AV Vmean:          92.200 cm/s AV VTI:            0.256 m AV Peak Grad:      7.2 mmHg AV Mean Grad:      4.0 mmHg LVOT Vmax:         119.00 cm/s LVOT Vmean:        74.500 cm/s LVOT VTI:          0.217 m LVOT/AV VTI ratio: 0.85  AORTA Ao Root diam:  3.10 cm Ao Asc diam:  4.30 cm  MITRAL VALVE               TRICUSPID VALVE MV Area (PHT): 4.19 cm    TR Peak grad:   23.0 mmHg MV Decel Time: 181 msec    TR Vmax:        240.00 cm/s MV E velocity: 76.80 cm/s MV A velocity: 54.80 cm/s  SHUNTS MV E/A ratio:  1.40        Systemic VTI:  0.22 m Systemic Diam: 2.20 cm  Dorris Carnes MD Electronically signed by Dorris Carnes MD Signature Date/Time: 09/18/2022/7:22:07 PM    Final              Patient Profile     61 y.o. 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   Medication Recommendations:  ASA 81 mg PO daily, Coreg 6.25 mg PO BID, Hydralazine 50 mg PO TID, rosuvastatin 10 mg Other recommendations (labs, testing, etc):  at outpatient f/u would consider CCTA if CP persists (retrospective for valve eval) Follow up as an outpatient:  Has 10/25/22 f/u     For questions or updates, please contact [facility] Please consult [email or web address] for contact info under Cardiology/STEMI.      Rudean Haskell, MD [facility], [address] [place], [place] [address] [phone]  8:36 AM

## 2022-10-05 NOTE — Progress Notes (Signed)
Pt escorted by this RN via wheelchair. Wife arrived in private vehicle to take pt home. Discharge paperwork discussed with patient. Patient verbalized understanding. No further question or comments at time of discharge.

## 2022-10-05 NOTE — Progress Notes (Signed)
D/C order noted. Pt also has orders for HD. Contacted nephrologist to inquire if pt would be appropriate for out-pt HD at clinic tomorrow to avoid inpt HD today. MD agreeable if clinic has availability. Contacted [facility] and spoke to [employer]. Advised that clinic unable to work pt in tomorrow due to staffing. Update provided to nephrologist that clinic unable to treat tomorrow and pt will require inpt HD. Clinic advised pt to d/c today and resume care on Saturday. Per CM note, pt needing assistance with transportation home. Pt does not have transportation to clinic today even if clinic able to work pt in this afternoon.   [name] Renal Navigator [phone]

## 2022-10-05 NOTE — Progress Notes (Signed)
Patient ID: Timothy [name], male   DOB: 05-16-1961, 61 y.o.   MRN: [id number] S: Feels better and wants to go home O:BP 126/84 (BP Location: Right Arm)   Pulse 64   Temp 98.7 F (37.1 C) (Oral)   Resp (!) 24   Ht '6\' 1"'$  (1.854 m)   Wt 74.7 kg   SpO2 94%   BMI 21.72 kg/m   Intake/Output Summary (Last 24 hours) at 10/05/2022 1058 Last data filed at 10/05/2022 0800 Gross per 24 hour  Intake 418 ml  Output 3000 ml  Net -2582 ml   Intake/Output: I/O last 3 completed shifts: In: 418 [P.O.:418] Out: 5300 [Other:5300]  Intake/Output this shift:  Total I/O In: 118 [P.O.:118] Out: -  Weight change: 3.042 kg Gen: NAD CVS: RRR Resp: CTA Abd: +BS,soft, NT/ND Ext: no edema, LUE AVF +T/B  Recent Labs  Lab 10/02/22 0346 10/03/22 1955 10/04/22 1511  NA 134* 135 134*  K 4.4 3.5 3.4*  CL 99 94* 94*  CO2 19* 27 27  GLUCOSE 73 77 96  BUN 60* 33* 38*  CREATININE 6.41* 3.83* 4.51*  ALBUMIN 3.1* 2.9* 2.8*  CALCIUM 8.5* 8.2* 7.9*  PHOS  --  3.6 4.2  AST 30  --   --   ALT 24  --   --    Liver Function Tests: Recent Labs  Lab 10/02/22 0346 10/03/22 1955 10/04/22 1511  AST 30  --   --   ALT 24  --   --   ALKPHOS 134*  --   --   BILITOT 1.7*  --   --   PROT 7.3  --   --   ALBUMIN 3.1* 2.9* 2.8*   No results for input(s): "LIPASE", "AMYLASE" in the last 168 hours. No results for input(s): "AMMONIA" in the last 168 hours. CBC: Recent Labs  Lab 10/02/22 0346 10/03/22 1959 10/04/22 1511 10/05/22 0107  WBC 7.6 4.8 3.8* 4.6  NEUTROABS  --   --   --  3.2  HGB 10.5* 10.3* 10.0* 10.4*  HCT 32.1* 31.1* 30.6* 30.9*  MCV 87.5 85.9 85.7 85.4  PLT 177 149* 133* 150   Cardiac Enzymes: No results for input(s): "CKTOTAL", "CKMB", "CKMBINDEX", "TROPONINI" in the last 168 hours. CBG: Recent Labs  Lab 10/02/22 1124 10/02/22 2023 10/03/22 2051 10/04/22 0015  GLUCAP 61* 77 99 131*    Iron Studies: No results for input(s): "IRON", "TIBC", "TRANSFERRIN", "FERRITIN" in the last  72 hours. Studies/Results: DG Chest Port 1 View  Result Date: 10/05/2022 CLINICAL DATA:  Shortness of breath EXAM: PORTABLE CHEST 1 VIEW COMPARISON:  Three days ago FINDINGS: Cardiomegaly and vascular pedicle widening. Bands of opacity at the bases, greater on the right. No effusion or pneumothorax. IMPRESSION: 1. Mild increase in atelectasis on the right. 2. Cardiomegaly. Electronically Signed   By: Jorje Guild M.D.   On: 10/05/2022 06:44    aspirin  81 mg Oral Daily   azithromycin  500 mg Oral Daily   carvedilol  6.25 mg Oral BID WC   Chlorhexidine Gluconate Cloth  6 each Topical Q0600   Chlorhexidine Gluconate Cloth  6 each Topical Q0600   DULoxetine  20 mg Oral Daily   heparin  5,000 Units Subcutaneous Q8H   hydrALAZINE  50 mg Oral Q8H   losartan  12.5 mg Oral Daily   montelukast  10 mg Oral QHS   nicotine  14 mg Transdermal Daily   pantoprazole  40 mg Oral BID AC  rosuvastatin  10 mg Oral Daily   sevelamer carbonate  2,400 mg Oral TID WC   sodium chloride flush  3 mL Intravenous Q12H    BMET    Component Value Date/Time   NA 134 (L) 10/04/2022 1511   K 3.4 (L) 10/04/2022 1511   CL 94 (L) 10/04/2022 1511   CO2 27 10/04/2022 1511   GLUCOSE 96 10/04/2022 1511   BUN 38 (H) 10/04/2022 1511   CREATININE 4.51 (H) 10/04/2022 1511   CREATININE 1.59 (H) 07/29/2015 0943   CALCIUM 7.9 (L) 10/04/2022 1511   CALCIUM 8.4 (L) 09/14/2016 1615   GFRNONAA 14 (L) 10/04/2022 1511   GFRNONAA 48 (L) 07/29/2015 0943   GFRAA 7 (L) 05/18/2019 2220   GFRAA 56 (L) 07/29/2015 0943   CBC    Component Value Date/Time   WBC 4.6 10/05/2022 0107   RBC 3.62 (L) 10/05/2022 0107   HGB 10.4 (L) 10/05/2022 0107   HCT 30.9 (L) 10/05/2022 0107   PLT 150 10/05/2022 0107   MCV 85.4 10/05/2022 0107   MCH 28.7 10/05/2022 0107   MCHC 33.7 10/05/2022 0107   RDW 16.9 (H) 10/05/2022 0107   LYMPHSABS 0.4 (L) 10/05/2022 0107   MONOABS 0.9 10/05/2022 0107   EOSABS 0.0 10/05/2022 0107   BASOSABS 0.0  10/05/2022 0107    Dialysis Orders: Center: [facility]  on TTS . 180NRe 3 hr 45 min BFR 450 DFR Auto 1.5 EDW 70.5kg 2K 2 Ca AVF 15g  No heparin Mircera 245mg IV q 2 weeks Hectorol 4 mcg IV q HD   Home Bp meds: carvedilol 6.'25mg'$  BID, diltiazem '360mg'$  QD, hydralazine '50mg'$  BID   Assessment/Plan:  Shortness of breath: CXR without edema but patient does appear volume overloaded and left his last HD 8.5kg over his EDW. He has also not been taking his antihypertensive medications. Had HD yesterday but signed off early against medical advise, still had 3L UF. fortunately respiratory status is improved. Needs ongoing counseling regarding adherence for fluid restrictions and medications.  Will plan for HD again today as he remains 4 kg above edw and is on TTS schedule as an outpatient.  ESRD:  TTS schedule but extra HD yesterday, see above. Resume TTS schedule today and discharge to home afterwards.  Follow up on Saturday with outpatient unit.   Hypertension/volume: BP improved with home meds and HD. Needs further volume removal, 3L UF goal with HD today.   Anemia: Hgb at goal. Not due for ESA yet.   Metabolic bone disease: Calcium at goal.  Continue VDRA and phosphorus binders (renvela 3 tabs per meal)  Nutrition:  Albumin 3.1, Will need renal diet and strict fluid restrictions once tolerating PO Disposition - plan to be discharged today after HD.  JDonetta Potts MD [facility]

## 2022-10-05 NOTE — Discharge Instructions (Signed)
Follow with Primary MD [name], MD, your cardiologist and your nephrologist in 7 days   Get CBC, CMP, 2 view Chest X ray -  checked next visit with your primary MD   Activity: As tolerated with Full fall precautions use walker/cane & assistance as needed  Disposition Home    Diet: Renal diet with strict 1200 cc fluid restriction per day.  Check your Weight same time everyday, if you gain over 2 pounds, or you develop in leg swelling, experience more shortness of breath or chest pain, call your Primary MD immediately. Follow Cardiac Low Salt Diet and 1.2 lit/day fluid restriction.  Special Instructions: If you have smoked or chewed Tobacco  in the last 2 yrs please stop smoking, stop any regular Alcohol  and or any Recreational drug use.  On your next visit with your primary care physician please Get Medicines reviewed and adjusted.  Please request your Prim.MD to go over all Hospital Tests and Procedure/Radiological results at the follow up, please get all Hospital records sent to your Prim MD by signing hospital release before you go home.  If you experience worsening of your admission symptoms, develop shortness of breath, life threatening emergency, suicidal or homicidal thoughts you must seek medical attention immediately by calling 911 or calling your MD immediately  if symptoms less severe.  You Must read complete instructions/literature along with all the possible adverse reactions/side effects for all the Medicines you take and that have been prescribed to you. Take any new Medicines after you have completely understood and accpet all the possible adverse reactions/side effects.   Do not drive, operate heavy machinery, perform activities at heights, swimming or participation in water activities or provide baby sitting services if your were admitted for syncope or siezures until you have seen by Primary MD or a Neurologist and advised to do so again.  Do not drive when taking  Pain medications.  Do not take more than prescribed Pain, Sleep and Anxiety Medications  Wear Seat belts while driving.   Please note  You were cared for by a hospitalist during your hospital stay. If you have any questions about your discharge medications or the care you received while you were in the hospital after you are discharged, you can call the unit and asked to speak with the hospitalist on call if the hospitalist that took care of you is not available. Once you are discharged, your primary care physician will handle any further medical issues. Please note that NO REFILLS for any discharge medications will be authorized once you are discharged, as it is imperative that you return to your primary care physician (or establish a relationship with a primary care physician if you do not have one) for your aftercare needs so that they can reassess your need for medications and monitor your lab values.

## 2022-10-05 NOTE — TOC Transition Note (Addendum)
Transition of Care [facility]) - CM/SW Discharge Note   Patient Details  Name: Timothy [name] MRN: [id number] Date of Birth: 1961/08/08  [facility] CM/SW Contact:  Zenon Mayo, RN Phone Number: 10/05/2022, 11:06 AM   Clinical Narrative:    For dc today, need transportation ast. Will have him to sign the waiver form. DC after diaylsis.   Final next level of care: Home/Self Care Barriers to Discharge: No Barriers Identified   Patient Goals and CMS Choice Patient states their goals for this hospitalization and ongoing recovery are:: return home   Choice offered to / list presented to : NA    Discharge Placement                       Discharge Plan and Services In-house Referral: NA Discharge Planning Services: CM Consult Post Acute Care Choice: NA          DME Arranged: N/A DME Agency: NA                  Social Determinants of Health (SDOH) Interventions     Readmission Risk Interventions    10/05/2022   11:01 AM 08/02/2022    3:37 PM 02/15/2022    2:57 PM  Readmission Risk Prevention Plan  Transportation Screening Complete Complete Complete  Medication Review [employer] photographer) Complete Complete Complete  PCP or Specialist appointment within 3-5 days of discharge Complete Complete Complete  HRI or Home Care Consult Complete Complete Complete  SW Recovery Care/Counseling Consult  Complete Complete  Palliative Care Screening Not Applicable Not Applicable Not [place] Not Applicable Not Applicable Not Applicable

## 2022-10-05 NOTE — Discharge Summary (Signed)
Timothy [name] AGT:[id number] DOB: 09-11-1961 DOA: 10/02/2022  PCP: [name], MD  Admit date: 10/02/2022  Discharge date: 10/05/2022  Admitted From: Home   Disposition:  Home   Recommendations for Outpatient Follow-up:   Follow up with PCP in 1-2 weeks  PCP Please obtain BMP/CBC, 2 view CXR in 1week,  (see Discharge instructions)   PCP Please follow up on the following pending results:    Home Health: None   Equipment/Devices: None  Consultations: Cards, Renal Discharge Condition: Fair  CODE STATUS: Full    Diet Recommendation-renal with 1.2 L fluid restriction per day    Chief Complaint  Patient presents with   Shortness of Breath    Hemodialysis     Brief history of present illness from the day of admission and additional interim summary    61 y.o. male with medical history significant of ESRD on HD (TTS, hypertension, polysubstance abuse, and GI bleed who presents with complaints of shortness of breath.  History is limited due to patient being on BiPAP and work of breathing.  Just recently hospitalized from 12/3-12/8 for acute respiratory failure after leaving hemodialysis related to the patient not abide by fluid restrictions.  During the hospitalization patient required intubation for urgent dialysis with improvement.  Found to be positive for RSV and treated for superimposed bacterial pneumonia with leukocytosis and elevated procalcitonin levels.  Patient reports dialyzing last 2 days ago. Review of records at the time of admission noted that patient was 8.5 kg over's estimated dry weight.   He was diagnosed to be in fluid overload, and acute hypoxic respiratory failure, he was placed on BiPAP and admitted to the hospital.                                                                  Hospital Course    Acute respiratory distress secondary to ESRD  possible healthcare associated pneumonia  - Patient presents with complaints of shortness of breath with tachypnea.  Patient was placed on BiPAP due to work of breathing.  On physical exam patient with Rales noted in right lower lung field.  Weight was above his dry weight by at least 8 kg's upon admission, previous admission consistent with fluid overload, his presentation most likely is due to dietary noncompliance and fluid overload.  Was emergently dialyzed on 10/02/2022 with improvement, now off of BiPAP, repeat dialysis on 10/03/2022, still not back to his baseline and short of breath on exertion, by nephrology and underwent 3 dialysis sessions in 3 days with over 6 L of fluid removed, much improved and symptom-free on room air eager to go home.  Cleared by nephrology and cardiology for discharge.    ESRD.  TTS schedule.  Renal on board.  Case discussed with nephrologist Dr. Marval Regal on 10/05/2022.  Hypertensive urgency - Blood pressures initially elevated up to 187/130.  Due to hypoxia and shortness of breath, questionable compliance with medications, blood pressure medications adjusted as needed hydralazine added IV.  Will monitor.   Elevated troponin - Acute.  High-sensitivity troponins are 192-> 242.  Patient did not report any complaints of chest pain. EKG did not note any significant ischemic changes.  Suspect secondary to demand mismatch in setting of acute respiratory distress as well as hypertensive urgency in the setting of ESRD, symptom-free now continue to monitor clinically.  Be discharged on combination of aspirin, beta-blocker and statin with outpatient cardiology follow-up.   Acute on chronic systolic heart failure EF 45%.  Recurrent pulmonary edema, treatment as in #1 above, on Coreg,, was seen by cardiology and his ARB was switched to hydralazine, I have added Imdur, currently compensated counseled on fluid  restriction.  Will be discharged home with outpatient follow-up with PCP and cardiology postdischarge.   Anemia of chronic kidney disease   Stable.  Hemoglobin 10.5 which appears around patient's baseline. Continue to monitor.   Anxiety and depression - Continue Cymbalta   Hypoglycemia - upon admission due to severe hypoxia, increased work of breathing and decreased oral intake.  Resolved with supportive care.    Discharge diagnosis     Active Problems:   ESRD on HD TTS   Acute respiratory distress   Pneumonia of right lower lobe due to infectious organism   Hypertensive urgency   Elevated troponin   Hypoglycemia   Anemia of chronic disease   Anxiety and depression    Discharge instructions    Discharge Instructions     Discharge instructions   Complete by: As directed    Follow with Primary MD [name], MD, your cardiologist and your nephrologist in 7 days   Get CBC, CMP, 2 view Chest X ray -  checked next visit with your primary MD   Activity: As tolerated with Full fall precautions use walker/cane & assistance as needed  Disposition Home    Diet: Renal diet with strict 1200 cc fluid restriction per day.  Check your Weight same time everyday, if you gain over 2 pounds, or you develop in leg swelling, experience more shortness of breath or chest pain, call your Primary MD immediately. Follow Cardiac Low Salt Diet and 1.2 lit/day fluid restriction.  Special Instructions: If you have smoked or chewed Tobacco  in the last 2 yrs please stop smoking, stop any regular Alcohol  and or any Recreational drug use.  On your next visit with your primary care physician please Get Medicines reviewed and adjusted.  Please request your Prim.MD to go over all Hospital Tests and Procedure/Radiological results at the follow up, please get all Hospital records sent to your Prim MD by signing hospital release before you go home.  If you experience worsening of your admission  symptoms, develop shortness of breath, life threatening emergency, suicidal or homicidal thoughts you must seek medical attention immediately by calling 911 or calling your MD immediately  if symptoms less severe.  You Must read complete instructions/literature along with all the possible adverse reactions/side effects for all the Medicines you take and that have been prescribed to you. Take any new Medicines after you have completely understood and accpet all the possible adverse reactions/side effects.   Do not drive, operate heavy machinery, perform activities at heights, swimming or participation in water activities or provide baby sitting services if your were admitted for syncope or siezures until  you have seen by Primary MD or a Neurologist and advised to do so again.  Do not drive when taking Pain medications.  Do not take more than prescribed Pain, Sleep and Anxiety Medications  Wear Seat belts while driving.   Please note  You were cared for by a hospitalist during your hospital stay. If you have any questions about your discharge medications or the care you received while you were in the hospital after you are discharged, you can call the unit and asked to speak with the hospitalist on call if the hospitalist that took care of you is not available. Once you are discharged, your primary care physician will handle any further medical issues. Please note that NO REFILLS for any discharge medications will be authorized once you are discharged, as it is imperative that you return to your primary care physician (or establish a relationship with a primary care physician if you do not have one) for your aftercare needs so that they can reassess your need for medications and monitor your lab values.   Increase activity slowly   Complete by: As directed        Discharge Medications   Allergies as of 10/05/2022   No Known Allergies      Medication List     STOP taking these medications     diltiazem 360 MG 24 hr capsule Commonly known as: CARDIZEM CD   losartan 25 MG tablet Commonly known as: COZAAR       TAKE these medications    albuterol 108 (90 Base) MCG/ACT inhaler Commonly known as: VENTOLIN HFA Inhale 2 puffs into the lungs every 6 (six) hours as needed for wheezing or shortness of breath.   aspirin 81 MG chewable tablet Chew 1 tablet (81 mg total) by mouth daily. Start taking on: October 06, 2022   carvedilol 6.25 MG tablet Commonly known as: COREG Take 1 tablet (6.25 mg total) by mouth 2 (two) times daily with a meal.   cetirizine 10 MG tablet Commonly known as: ZYRTEC Take 10 mg by mouth daily.   DULoxetine 20 MG capsule Commonly known as: CYMBALTA Take 20 mg by mouth daily.   fluticasone 50 MCG/ACT nasal spray Commonly known as: FLONASE Place 1 spray into both nostrils 2 (two) times daily.   hydrALAZINE 50 MG tablet Commonly known as: APRESOLINE Take 1 tablet (50 mg total) by mouth 3 (three) times daily. What changed: when to take this   isosorbide mononitrate 30 MG 24 hr tablet Commonly known as: IMDUR Take 1 tablet (30 mg total) by mouth daily.   lidocaine 4 % cream Commonly known as: LMX Apply 1 Application topically 3 (three) times a week.   LIDOCAINE-PRILOCAINE EX Apply 1 application  topically Every Tuesday,Thursday,and Saturday with dialysis.   montelukast 10 MG tablet Commonly known as: SINGULAIR Take 10 mg by mouth at bedtime.   oxyCODONE 5 MG immediate release tablet Commonly known as: Oxy IR/ROXICODONE Take 5 mg by mouth 3 (three) times daily as needed for moderate pain or severe pain.   pantoprazole 40 MG tablet Commonly known as: PROTONIX Take 1 tablet (40 mg total) by mouth 2 (two) times daily before a meal.   rosuvastatin 10 MG tablet Commonly known as: CRESTOR Take 1 tablet (10 mg total) by mouth daily. Start taking on: October 06, 2022   sevelamer carbonate 800 MG tablet Commonly known as:  RENVELA Take 1,600 mg by mouth 4 (four) times daily.   Symbicort 160-4.5 MCG/ACT inhaler  Generic drug: budesonide-formoterol SMARTSIG:2 Puff(s) By Mouth         Follow-up Information     Deberah Pelton, NP Follow up on 10/25/2022.   Specialty: Cardiology Why: Appointment at 10:55 AM Contact information: [address] [place] [address] [phone]         [name], MD. Schedule an appointment as soon as possible for a visit in 1 week(s).   Specialty: Internal Medicine Why: Also follow-up with your cardiologist and your nephrologist within a week of discharge Contact information: [place] [address] [phone]                 Major procedures and Radiology Reports - PLEASE review detailed and final reports thoroughly  -      DG Chest Port 1 View  Result Date: 10/05/2022 CLINICAL DATA:  Shortness of breath EXAM: PORTABLE CHEST 1 VIEW COMPARISON:  Three days ago FINDINGS: Cardiomegaly and vascular pedicle widening. Bands of opacity at the bases, greater on the right. No effusion or pneumothorax. IMPRESSION: 1. Mild increase in atelectasis on the right. 2. Cardiomegaly. Electronically Signed   By: Jorje Guild M.D.   On: 10/05/2022 06:44   DG Abd 1 View  Result Date: 10/03/2022 CLINICAL DATA:  Abdominal bloating/distension.  Pain. EXAM: ABDOMEN - 1 VIEW COMPARISON:  Pelvic radiograph 10/03/2022. Abdominal radiograph 09/18/2022. FINDINGS: Surgical clips and embolization coils are again noted in the right hemiabdomen. There is a single loop of mildly prominent small bowel in the left mid abdomen. No dilated loops of bowel are seen elsewhere. Gas and a small to moderate amount of stool are present in nondilated colon. Atherosclerotic vascular calcifications are noted. No acute osseous abnormality is seen. IMPRESSION: Single mildly prominent loop of small bowel in the left mid abdomen, nonspecific though localized  inflammation/enteritis is possible. No definite evidence of bowel obstruction. Electronically Signed   By: Logan Bores M.D.   On: 10/03/2022 08:27   DG Pelvis 1-2 Views  Result Date: 10/03/2022 CLINICAL DATA:  Fall with pain. EXAM: PELVIS - 1-2 VIEW COMPARISON:  None Available. FINDINGS: There is no evidence of pelvic fracture or diastasis. Degenerative changes are noted at the sacroiliac joints bilaterally. Vascular calcifications are present in the pelvis and bilateral lower extremities. Surgical changes are noted in the mid right abdomen. IMPRESSION: No acute fracture or dislocation. Electronically Signed   By: Brett Fairy M.D.   On: 10/03/2022 00:20   DG Chest Port 1 View  Result Date: 10/02/2022 CLINICAL DATA:  61 year old male with shortness of breath, fatigue, abdominal distension. Hepatitis. EXAM: PORTABLE CHEST 1 VIEW COMPARISON:  Portable chest 09/18/2022 and earlier. FINDINGS: Portable AP upright view at 0353 hours. Extubated and enteric tube removed. Stable cardiomegaly and mediastinal contours. Stable lung volumes. Patchy and platelike opacity persists in the right lower lung. No superimposed pneumothorax, pulmonary edema or pleural effusion. Stable ventilation elsewhere. Paucity of bowel gas in the visible abdomen. No acute osseous abnormality identified. IMPRESSION: 1. Extubated and enteric tube removed. 2. Stable cardiomegaly, and confluent although plate-like opacity in the right lower lung which more resembles atelectasis than pneumonia. No edema or effusion is evident. Electronically Signed   By: Genevie Ann M.D.   On: 10/02/2022 04:17   ECHOCARDIOGRAM COMPLETE  Result Date: 09/18/2022    ECHOCARDIOGRAM REPORT   Patient Name:   Timothy [name] Date of Exam: 09/18/2022 Medical Rec #:  [id number]       Height:  73.0 in Accession #:    [id number]      Weight:       155.6 lb Date of Birth:  1960-12-26       BSA:          1.934 m Patient Age:    61 years        BP:           133/96 mmHg  Patient Gender: M               HR:           66 bpm. Exam Location:  Inpatient Procedure: 2D Echo, Color Doppler and Cardiac Doppler Indications:    Elevated troponin  History:        Patient has prior history of Echocardiogram examinations, most                 recent 02/14/2022. CHF; Risk Factors:Dyslipidemia. DOE. CKD.                 Polysusbstance abuse.  Sonographer:    Clayton Lefort RDCS (AE) Referring Phys: [id number] Kayleen Memos  Sonographer Comments: Echo performed with patient supine and on artificial respirator. IMPRESSIONS  1. Left ventricular ejection fraction, by estimation, is 40 to 45%. The left ventricle has mildly decreased function. The left ventricular internal cavity size was moderately dilated. There is severe concentric left ventricular hypertrophy.  2. Right ventricular systolic function is moderately reduced. The right ventricular size is mildly enlarged. Mildly increased right ventricular wall thickness.  3. Left atrial size was mildly dilated.  4. Right atrial size was mildly dilated.  5. Mild mitral valve regurgitation.  6. AV is thickened, calcified Appears functionally bicuspid. Peak and mean gradients through the valve are 7 and 4 mm Hg respectively. AVA (VTI) is 3.22 cm2. DVI is 0.85 consistent with mild AS.Marland Kitchen The aortic valve is bicuspid. Aortic valve regurgitation is mild.  7. Aortic dilatation noted. There is moderate dilatation of the ascending aorta, measuring 43 mm.  8. The inferior vena cava is normal in size with greater than 50% respiratory variability, suggesting right atrial pressure of 3 mmHg. FINDINGS  Left Ventricle: Left ventricular ejection fraction, by estimation, is 40 to 45%. The left ventricle has mildly decreased function. The left ventricular internal cavity size was moderately dilated. There is severe concentric left ventricular hypertrophy. Right Ventricle: The right ventricular size is mildly enlarged. Mildly increased right ventricular wall thickness. Right  ventricular systolic function is moderately reduced. Left Atrium: Left atrial size was mildly dilated. Right Atrium: Right atrial size was mildly dilated. Pericardium: There is no evidence of pericardial effusion. Mitral Valve: There is mild thickening of the mitral valve leaflet(s). Mild to moderate mitral annular calcification. Mild mitral valve regurgitation. Tricuspid Valve: The tricuspid valve is normal in structure. Tricuspid valve regurgitation is mild. Aortic Valve: AV is thickened, calcified Appears functionally bicuspid. Peak and mean gradients through the valve are 7 and 4 mm Hg respectively. AVA (VTI) is 3.22 cm2. DVI is 0.85 consistent with mild AS. The aortic valve is bicuspid. Aortic valve regurgitation is mild. Aortic valve mean gradient measures 4.0 mmHg. Aortic valve peak gradient measures 7.2 mmHg. Aortic valve area, by VTI measures 3.22 cm. Pulmonic Valve: The pulmonic valve was not well visualized. Pulmonic valve regurgitation is not visualized. No evidence of pulmonic stenosis. Aorta: Aortic dilatation noted. There is moderate dilatation of the ascending aorta, measuring 43 mm. Venous: The inferior vena cava is normal in size  with greater than 50% respiratory variability, suggesting right atrial pressure of 3 mmHg. IAS/Shunts: No atrial level shunt detected by color flow Doppler.  LEFT VENTRICLE PLAX 2D LVIDd:         5.30 cm      Diastology LVIDs:         4.20 cm      LV e' medial:    6.42 cm/s LV PW:         1.60 cm      LV E/e' medial:  12.0 LV IVS:        1.80 cm      LV e' lateral:   9.03 cm/s LVOT diam:     2.20 cm      LV E/e' lateral: 8.5 LV SV:         82 LV SV Index:   43 LVOT Area:     3.80 cm  LV Volumes (MOD) LV vol d, MOD A2C: 172.0 ml LV vol d, MOD A4C: 189.0 ml LV vol s, MOD A2C: 98.2 ml LV vol s, MOD A4C: 88.2 ml LV SV MOD A2C:     73.8 ml LV SV MOD A4C:     189.0 ml LV SV MOD BP:      85.4 ml RIGHT VENTRICLE RV S prime:     9.90 cm/s TAPSE (M-mode): 1.6 cm LEFT ATRIUM              Index        RIGHT ATRIUM           Index LA diam:        4.90 cm 2.53 cm/m   RA Area:     20.10 cm LA Vol (A2C):   62.1 ml 32.11 ml/m  RA Volume:   55.50 ml  28.69 ml/m LA Vol (A4C):   53.5 ml 27.66 ml/m LA Biplane Vol: 69.0 ml 35.67 ml/m  AORTIC VALVE AV Area (Vmax):    3.38 cm AV Area (Vmean):   3.07 cm AV Area (VTI):     3.22 cm AV Vmax:           134.00 cm/s AV Vmean:          92.200 cm/s AV VTI:            0.256 m AV Peak Grad:      7.2 mmHg AV Mean Grad:      4.0 mmHg LVOT Vmax:         119.00 cm/s LVOT Vmean:        74.500 cm/s LVOT VTI:          0.217 m LVOT/AV VTI ratio: 0.85  AORTA Ao Root diam: 3.10 cm Ao Asc diam:  4.30 cm MITRAL VALVE               TRICUSPID VALVE MV Area (PHT): 4.19 cm    TR Peak grad:   23.0 mmHg MV Decel Time: 181 msec    TR Vmax:        240.00 cm/s MV E velocity: 76.80 cm/s MV A velocity: 54.80 cm/s  SHUNTS MV E/A ratio:  1.40        Systemic VTI:  0.22 m                            Systemic Diam: 2.20 cm Dorris Carnes MD Electronically signed by Dorris Carnes MD Signature Date/Time: 09/18/2022/7:22:07 PM    Final  DG Abd Portable 1V  Result Date: 09/18/2022 CLINICAL DATA:  Orogastric tube placement EXAM: PORTABLE ABDOMEN - 1 VIEW COMPARISON:  Earlier films from the same day FINDINGS: Gastric tube has been advanced into the decompressed stomach. Small bowel and colon are nondilated. Embolization coils project in the mid abdomen. IMPRESSION: Gastric tube advanced into the stomach. Electronically Signed   By: Lucrezia Europe M.D.   On: 09/18/2022 09:07   DG Chest Port 1 View  Result Date: 09/18/2022 CLINICAL DATA:  Endotracheal tube placement EXAM: PORTABLE CHEST 1 VIEW COMPARISON:  09/18/2022 FINDINGS: Endotracheal tube tip is at the level of the clavicular heads. The side port and tip of the nasogastric tube are below the field of view. Unchanged appearance of right middle lobe consolidation IMPRESSION: 1. Endotracheal tube tip is at the level of the clavicular heads.  2. Unchanged right middle lobe consolidation. Electronically Signed   By: Ulyses Jarred M.D.   On: 09/18/2022 03:32   DG Chest Port 1 View  Result Date: 09/18/2022 CLINICAL DATA:  Shortness of breath, respiratory distress EXAM: PORTABLE CHEST 1 VIEW COMPARISON:  08/14/2022 FINDINGS: Cardiomegaly, vascular congestion. Right lower lobe airspace opacity/consolidation. Left mid lung atelectasis. No effusions or acute bony abnormality. IMPRESSION: No megaly, vascular congestion. Right lower lobe opacity concerning for pneumonia. Electronically Signed   By: Rolm Baptise M.D.   On: 09/18/2022 00:21    Today   Subjective    Kiyoshi Schaab today has no headache,no chest abdominal pain,no new weakness tingling or numbness, feels much better wants to go home today.    Objective   Blood pressure 126/84, pulse 64, temperature 98.7 F (37.1 C), temperature source Oral, resp. rate (!) 24, height '6\' 1"'$  (1.854 m), weight 74.7 kg, SpO2 94 %.   Intake/Output Summary (Last 24 hours) at 10/05/2022 1048 Last data filed at 10/05/2022 0800 Gross per 24 hour  Intake 418 ml  Output 3000 ml  Net -2582 ml    Exam  Awake Alert, No new F.N deficits,    [place].AT,PERRAL Supple Neck,   Symmetrical Chest wall movement, Good air movement bilaterally, CTAB RRR,No Gallops,   +ve B.Sounds, Abd Soft, Non tender,  No Cyanosis, Clubbing or edema    Data Review   Recent Labs  Lab 10/02/22 0346 10/03/22 1959 10/04/22 1511 10/05/22 0107  WBC 7.6 4.8 3.8* 4.6  HGB 10.5* 10.3* 10.0* 10.4*  HCT 32.1* 31.1* 30.6* 30.9*  PLT 177 149* 133* 150  MCV 87.5 85.9 85.7 85.4  MCH 28.6 28.5 28.0 28.7  MCHC 32.7 33.1 32.7 33.7  RDW 17.3* 16.6* 16.9* 16.9*  LYMPHSABS  --   --   --  0.4*  MONOABS  --   --   --  0.9  EOSABS  --   --   --  0.0  BASOSABS  --   --   --  0.0    Recent Labs  Lab 10/02/22 0346 10/02/22 0437 10/02/22 0810 10/03/22 1955 10/04/22 1511 10/05/22 0107  NA 134*  --   --  135 134*  --   K 4.4   --   --  3.5 3.4*  --   CL 99  --   --  94* 94*  --   CO2 19*  --   --  27 27  --   ANIONGAP 16*  --   --  14 13  --   GLUCOSE 73  --   --  77 96  --   BUN  60*  --   --  33* 38*  --   CREATININE 6.41*  --   --  3.83* 4.51*  --   AST 30  --   --   --   --   --   ALT 24  --   --   --   --   --   ALKPHOS 134*  --   --   --   --   --   BILITOT 1.7*  --   --   --   --   --   ALBUMIN 3.1*  --   --  2.9* 2.8*  --   PROCALCITON  --   --  0.26  --   --   --   BNP  --  16.6  --   --   --  4,280.1*  MG  --   --   --   --   --  2.0  CALCIUM 8.5*  --   --  8.2* 7.9*  --      Total Time in preparing paper work, data evaluation and todays exam - 35 minutes  Signature  -    Lala Lund M.D on 10/05/2022 at 10:48 AM   -  To page go to [email or web address]

## 2022-10-05 NOTE — TOC Initial Note (Signed)
Transition of Care [facility]) - Initial/Assessment Note    Patient Details  Name: Timothy [name] MRN: [id number] Date of Birth: [birth year]  Transition of [facility]) CM/SW Contact:    Zenon Mayo, RN Phone Number: 10/05/2022, 11:05 AM  Clinical Narrative:                 Patient is from home with wife, uses a cane at home.  He is HD patient TTHSAT, he is for dc today.  He states he will need transport asist to get home today.  Hospital follow up on AVS.   Planned Disposition: Home Barriers to Discharge: No Barriers Identified   Patient Goals and CMS Choice Patient states their goals for this hospitalization and ongoing recovery are:: return home   Choice offered to / list presented to : NA      Expected Discharge Plan and Services Planned Disposition: Home In-house Referral: NA Discharge Planning Services: CM Consult Post Acute Care Choice: NA Living arrangements for the past 2 months: Apartment Expected Discharge Date: 10/05/22               DME Arranged: N/A DME Agency: NA                  Prior Living Arrangements/Services Living arrangements for the past 2 months: Apartment Lives with:: Spouse Patient language and need for interpreter reviewed:: Yes Do you feel safe going back to the place where you live?: Yes      Need for Family Participation in Patient Care: Yes (Comment) Care giver support system in place?: Yes (comment) Current home services: DME (cane) Criminal Activity/Legal Involvement Pertinent to Current Situation/Hospitalization: No - Comment as needed  Activities of Daily Living Home Assistive Devices/Equipment: Cane (specify quad or straight) ADL Screening (condition at time of admission) Patient's cognitive ability adequate to safely complete daily activities?: Yes Is the patient deaf or have difficulty hearing?: No Does the patient have difficulty seeing, even when wearing glasses/contacts?: No Does the patient have difficulty  concentrating, remembering, or making decisions?: No Patient able to express need for assistance with ADLs?: Yes Does the patient have difficulty dressing or bathing?: No Independently performs ADLs?: Yes (appropriate for developmental age) Does the patient have difficulty walking or climbing stairs?: Yes Weakness of Legs: Both Weakness of Arms/Hands: None  Permission Sought/Granted                  Emotional Assessment   Attitude/Demeanor/Rapport: Engaged Affect (typically observed): Appropriate Orientation: : Oriented to Self, Oriented to Place, Oriented to  Time, Oriented to Situation Alcohol / Substance Use: Not Applicable Psych Involvement: No (comment)  Admission diagnosis:  Acute respiratory failure with hypoxia ([place]) [J96.01] Other hypervolemia [E87.79] Patient Active Problem List   Diagnosis Date Noted   Hypoglycemia 10/02/2022   Anxiety and depression 10/02/2022   Acute hypoxic respiratory failure ([place]) 09/18/2022   Acute respiratory distress 09/18/2022   Pneumonia of right lower lobe due to infectious organism 09/18/2022   ESRD on dialysis ([place]) 09/18/2022   Altered mental status 09/18/2022   Angiodysplasia of duodenum 08/13/2022   Anemia of chronic disease 08/12/2022   Polysubstance abuse ([place]) 07/03/2022   Hypertensive urgency 04/07/2022   Acute on chronic diastolic CHF (congestive heart failure) ([place]) 04/06/2022   historyof treated hep C  03/15/2022   Elevated troponin 02/13/2022   Bilateral lower extremity pain 11/30/2021   Paroxysmal A-fib ([place]) 11/30/2021   Upper GI bleed 11/05/2021   ESRD on HD TTS 11/01/2021  Pericardial effusion without cardiac tamponade 10/05/2021   Chronic combined systolic and diastolic CHF (congestive heart failure) ([place]) 10/05/2021   Renal mass    Protein-calorie malnutrition, severe 06/13/2021   Asthma 06/01/2021   CHF (congestive heart failure) ([place]) 06/01/2021   History of cancer 06/01/2021   DOE (dyspnea on exertion)  10/25/2020   Headache, unspecified 11/20/2016   History of colonic polyps 10/26/2016   Chronic blood loss anemia 09/22/2016   Chronic kidney disease 09/22/2016   Coagulation defect, unspecified ([place]) 09/22/2016   Encounter for fitting and adjustment of extracorporeal dialysis catheter ([place]) 09/22/2016   Hypertensive heart disease without heart failure 09/22/2016   Secondary hyperparathyroidism of renal origin ([place]) 09/22/2016   Malnutrition of moderate degree 09/20/2016   History of colon cancer    Hyponatremia    Pulmonary edema 09/09/2016   Substance abuse ([place]) 09/09/2016   Nonadherence to medical treatment 09/09/2016   Anemia due to chronic kidney disease 09/09/2016   Acute on chronic combined systolic and diastolic CHF (congestive heart failure) ([place]) 07/17/2016   Heroin abuse ([place]) 07/17/2016   HTN (hypertension) 07/17/2016   Benign prostate hyperplasia 07/16/2015   PCP:  [name], MD Pharmacy:   [facility], [place] [address] Phone: [phone] Fax: [place], [place] [address] Phone: [phone] Fax: [phone]     Social Determinants of Health (SDOH) Social History: SDOH Screenings   Food Insecurity: No Food Insecurity (09/21/2022)  Housing: Low Risk  (09/21/2022)  Transportation Needs: No Transportation Needs (09/21/2022)  Utilities: Not At Risk (09/21/2022)  Tobacco Use: Medium Risk (09/21/2022)   SDOH Interventions:     Readmission Risk Interventions    10/05/2022   11:01 AM 08/02/2022    3:37 PM 02/15/2022    2:57 PM  Readmission Risk Prevention Plan  Transportation Screening Complete Complete Complete  Medication Review Press photographer) Complete Complete Complete  PCP or Specialist appointment within 3-5 days of  discharge Complete Complete Complete  HRI or Home Care Consult Complete Complete Complete  SW Recovery Care/Counseling Consult  Complete Complete  Palliative Care Screening Not Applicable Not Applicable Not [place] Not Applicable Not Applicable Not Applicable

## 2022-10-05 NOTE — Progress Notes (Signed)
10/05/2022  Timothy [name] DOB: [birth year] MRN: [id number]   RIDER WAIVER AND RELEASE OF LIABILITY  For the purposes of helping with transportation needs, [facility] partners with outside transportation providers (taxi companies, [place], Social research officer, government.) to give [employer] patients or other approved people the choice of on-demand rides [employer]") to our buildings for non-emergency visits.  By using [employer], I, the person signing this document, on behalf of myself and/or any legal minors (in my care using the [employer]), agree:  Government social research officer given to me are supplied by independent, outside transportation providers who do not work for, or have any affiliation with, [employer]. [facility] is not a transportation company. [facility] has no control over the quality or safety of the rides I get using [employer]. [facility] has no control over whether any outside ride will happen on time or not. [facility] gives no guarantee on the reliability, quality, safety, or availability on any rides, or that no mistakes will happen. I know and accept that traveling by vehicle (car, truck, SVU, Lucianne Lei, bus, taxi, etc.) has risks of serious injuries such as disability, being paralyzed, and death. I know and agree the risk of using [employer] is mine alone, and not [employer]. Transport Services are provided "as is" and as are available. The transportation providers are in charge for all inspections and care of the vehicles used to provide these rides. I agree not to take legal action against [facility], its agents, employees, officers, directors, representatives, insurers, attorneys, assigns, successors, subsidiaries, and affiliates at any time for any reasons related directly or indirectly to using [employer]. I also agree not to take legal action against  or its affiliates for any injury, death, or damage to property caused by or related to using  [employer]. I have read this Waiver and Release of Liability, and I understand the terms used in it and their legal meaning. This Waiver is freely and voluntarily given with the understanding that my right (or any legal minors) to legal action against [facility] relating to [employer] is knowingly given up to use these services.   I attest that I read the Ride Waiver and Release of Liability to Timothy [name], gave Mr. Chui the opportunity to ask questions and answered the questions asked (if any). I affirm that Timothy [name] then provided consent for assistance with transportation.

## 2022-10-06 ENCOUNTER — Ambulatory Visit
Admission: RE | Admit: 2022-10-06 | Discharge: 2022-10-06 | Disposition: A | Discharge status: Home / Self Care | Payer: Medicare Other | Source: Ambulatory Visit | Attending: Family Medicine | Admitting: Family Medicine

## 2022-10-06 ENCOUNTER — Other Ambulatory Visit: Payer: Self-pay | Admitting: Family Medicine

## 2022-10-06 DIAGNOSIS — R0603 Acute respiratory distress: Secondary | ICD-10-CM

## 2022-10-06 LAB — HEPATITIS B SURFACE ANTIBODY, QUANTITATIVE: Hep B S AB Quant (Post): 22.5 m[IU]/mL (ref >9.9)

## 2022-10-07 ENCOUNTER — Telehealth: Payer: Self-pay | Admitting: Nurse Practitioner

## 2022-10-07 NOTE — Telephone Encounter (Signed)
Transition of care contact from inpatient facility  Date of Discharge: 10/05/2022  Date of Contact: 10/07/2022 Method of contact: Phone  Attempted to contact patient to discuss transition of care from inpatient admission. Patient did not answer the phone. Message was left on the patient's voicemail with call back number [phone]. Marland Kitchen

## 2022-10-18 ENCOUNTER — Telehealth (HOSPITAL_COMMUNITY): Payer: Self-pay | Admitting: *Deleted

## 2022-10-18 NOTE — Telephone Encounter (Signed)
Received fax from Dr Santiago Bumpers requesting fistulogram due to low arterial flow and low URR. Will give to [facility] to schedule.

## 2022-10-23 ENCOUNTER — Other Ambulatory Visit: Payer: Self-pay

## 2022-10-23 ENCOUNTER — Inpatient Hospital Stay: Payer: Self-pay | Admitting: Nurse Practitioner

## 2022-10-23 DIAGNOSIS — N186 End stage renal disease: Secondary | ICD-10-CM

## 2022-10-23 MED ORDER — SODIUM CHLORIDE 0.9 % IV SOLN: 250.0000 mL | INTRAVENOUS | Status: AC | PRN

## 2022-10-23 MED ORDER — SODIUM CHLORIDE 0.9% FLUSH: 3.0000 mL | Freq: Two times a day (BID) | INTRAVENOUS | Status: AC

## 2022-10-23 NOTE — Progress Notes (Deleted)
Cardiology Clinic Note   Patient Name: Kahari Mould Date of Encounter: 10/23/2022  Primary Care Provider:  [name], MD Primary Cardiologist:  Glenetta Hew, MD  Patient Profile    Brittani Peniston 62 year old male presents to the clinic today for follow-up evaluation of his chronic combined systolic and diastolic CHF.  Past Medical History    Past Medical History:  Diagnosis Date   Acute metabolic encephalopathy [id number]   Anemia of chronic kidney failure    BPH (benign prostatic hyperplasia)    Colon cancer [facility]) 2014   spouse states he had surgury for colon CA   End stage renal disease on dialysis [facility]) 2017   TTHSat   Hepatitis C    treated   Hypertension    Hypertensive crisis 04/07/2022   Hypertensive heart disease with chronic diastolic congestive heart failure ([place]) 07/17/2016   Polysubstance abuse ([place])    History of heroin and marijuana use   Thoracic aortic aneurysm ([place])    followed by Dr. Ellyn Hack   Past Surgical History:  Procedure Laterality Date   A/V FISTULAGRAM Left 12/02/2021   Procedure: A/V Fistulagram;  Surgeon: Cherre Robins, MD;  Location: [place] CV LAB;  Service: Cardiovascular;  Laterality: Left;   A/V FISTULAGRAM Left 04/24/2022   Procedure: A/V Fistulagram;  Surgeon: Waynetta Sandy, MD;  Location: [place] [facility];  Service: Cardiovascular;  Laterality: Left;   ABDOMINAL SURGERY     AV FISTULA PLACEMENT Left 09/19/2016   Procedure: Left arm Radiocephalic ARTERIOVENOUS (AV) FISTULA CREATION;  Surgeon: Conrad Marysville, MD;  Location: [place];  Service: Vascular;  Laterality: Left;   BASCILIC VEIN TRANSPOSITION Left 07/09/2017   Procedure: BRACHIOCEPHALIC FISTULA CREATION;  Surgeon: Conrad Poynette, MD;  Location: [place];  Service: Vascular;  Laterality: Left;   BIOPSY  07/05/2022   Procedure: BIOPSY;  Surgeon: Thornton Park, MD;  Location: [place];  Service: Gastroenterology;;   COLON SURGERY  2014    COLONOSCOPY N/A 07/12/2022   Procedure: COLONOSCOPY;  Surgeon: Doran Stabler, MD;  Location: [place];  Service: Gastroenterology;  Laterality: N/A;   COLONOSCOPY WITH PROPOFOL N/A 07/05/2022   Procedure: COLONOSCOPY WITH PROPOFOL;  Surgeon: Thornton Park, MD;  Location: [place];  Service: Gastroenterology;  Laterality: N/A;   ENTEROSCOPY N/A 07/28/2022   Procedure: ENTEROSCOPY;  Surgeon: [name], MD;  Location: [facility] ENDOSCOPY;  Service: Gastroenterology;  Laterality: N/A;   ESOPHAGOGASTRODUODENOSCOPY (EGD) WITH PROPOFOL N/A 11/05/2021   Procedure: ESOPHAGOGASTRODUODENOSCOPY (EGD) WITH PROPOFOL;  Surgeon: Carol Ada, MD;  Location: [place];  Service: Endoscopy;  Laterality: N/A;   ESOPHAGOGASTRODUODENOSCOPY (EGD) WITH PROPOFOL N/A 11/14/2021   Procedure: ESOPHAGOGASTRODUODENOSCOPY (EGD) WITH PROPOFOL;  Surgeon: [name], MD;  Location: [place];  Service: Gastroenterology;  Laterality: N/A;   ESOPHAGOGASTRODUODENOSCOPY (EGD) WITH PROPOFOL N/A 11/16/2021   Procedure: ESOPHAGOGASTRODUODENOSCOPY (EGD) WITH PROPOFOL;  Surgeon: [name], MD;  Location: [place];  Service: Gastroenterology;  Laterality: N/A;   ESOPHAGOGASTRODUODENOSCOPY (EGD) WITH PROPOFOL N/A 07/05/2022   Procedure: ESOPHAGOGASTRODUODENOSCOPY (EGD) WITH PROPOFOL;  Surgeon: Thornton Park, MD;  Location: [place];  Service: Gastroenterology;  Laterality: N/A;   ESOPHAGOGASTRODUODENOSCOPY (EGD) WITH PROPOFOL N/A 07/12/2022   Procedure: ESOPHAGOGASTRODUODENOSCOPY (EGD) WITH PROPOFOL;  Surgeon: Doran Stabler, MD;  Location: [place];  Service: Gastroenterology;  Laterality: N/A;   ESOPHAGOGASTRODUODENOSCOPY (EGD) WITH PROPOFOL N/A 07/31/2022   Procedure: ESOPHAGOGASTRODUODENOSCOPY (EGD) WITH PROPOFOL;  Surgeon: Ladene Artist, MD;  Location: [place];  Service: Gastroenterology;  Laterality: N/A;   ESOPHAGOGASTRODUODENOSCOPY (EGD) WITH PROPOFOL N/A 08/13/2022   Procedure:  ESOPHAGOGASTRODUODENOSCOPY (EGD) WITH PROPOFOL;  Surgeon: Gatha Mayer, MD;  Location: [place];  Service: Gastroenterology;  Laterality: N/A;   HEMOSTASIS CLIP PLACEMENT  11/16/2021   Procedure: HEMOSTASIS CLIP PLACEMENT;  Surgeon: [name], MD;  Location: [facility] ENDOSCOPY;  Service: Gastroenterology;;   HEMOSTASIS CLIP PLACEMENT  07/05/2022   Procedure: HEMOSTASIS CLIP PLACEMENT;  Surgeon: Thornton Park, MD;  Location: [facility] ENDOSCOPY;  Service: Gastroenterology;;   HEMOSTASIS CLIP PLACEMENT  08/13/2022   Procedure: HEMOSTASIS CLIP PLACEMENT;  Surgeon: Gatha Mayer, MD;  Location: [facility] ENDOSCOPY;  Service: Gastroenterology;;   HEMOSTASIS CONTROL  11/05/2021   Procedure: HEMOSTASIS CONTROL;  Surgeon: Carol Ada, MD;  Location: [place];  Service: Endoscopy;;   HEMOSTASIS CONTROL  11/16/2021   Procedure: HEMOSTASIS CONTROL;  Surgeon: [name], MD;  Location: [place];  Service: Gastroenterology;;   HEMOSTASIS CONTROL  07/05/2022   Procedure: HEMOSTASIS CONTROL;  Surgeon: Thornton Park, MD;  Location: [place];  Service: Gastroenterology;;   HOT HEMOSTASIS N/A 11/16/2021   Procedure: HOT HEMOSTASIS (ARGON PLASMA COAGULATION/BICAP);  Surgeon: [name], MD;  Location: [place];  Service: Gastroenterology;  Laterality: N/A;   HOT HEMOSTASIS N/A 08/13/2022   Procedure: HOT HEMOSTASIS (ARGON PLASMA COAGULATION/BICAP);  Surgeon: Gatha Mayer, MD;  Location: [place];  Service: Gastroenterology;  Laterality: N/A;   INSERTION OF DIALYSIS CATHETER N/A 09/19/2016   Procedure: INSERTION OF TUNNELED DIALYSIS CATHETER;  Surgeon: Conrad [place], MD;  Location: [place];  Service: Vascular;  Laterality: N/A;   IR ANGIOGRAM SELECTIVE EACH ADDITIONAL VESSEL  11/16/2021   IR ANGIOGRAM VISCERAL SELECTIVE  11/05/2021   IR ANGIOGRAM VISCERAL SELECTIVE  11/16/2021   IR ANGIOGRAM VISCERAL SELECTIVE  11/16/2021   IR EMBO ART  VEN HEMORR LYMPH EXTRAV  INC GUIDE ROADMAPPING  11/05/2021    IR EMBO ART  VEN HEMORR LYMPH EXTRAV  INC GUIDE ROADMAPPING  11/16/2021   IR GENERIC HISTORICAL  09/14/2016   IR US GUIDE VASC ACCESS RIGHT 09/14/2016 [name], DO [facility]   IR GENERIC HISTORICAL  09/14/2016   IR FLUORO GUIDE CV LINE RIGHT 09/14/2016 [name], DO [facility]   IR PARACENTESIS  06/22/2021   IR US GUIDE [place] RIGHT  11/05/2021   IR US GUIDE [place] RIGHT  11/16/2021   LAPAROTOMY N/A 05/23/2021   Procedure: EXPLORATORY LAPAROTOMY LYSIS ADHESIONS;  Surgeon: [name], MD;  Location: [place];  Service: General;  Laterality: N/A;   LAPAROTOMY N/A 11/20/2021   Procedure: EXPLORATORY LAPAROTOMY, REPAIR OF BLEEDING DUODENAL ULCER;  Surgeon: Dwan Bolt, MD;  Location: [place];  Service: General;  Laterality: N/A;   ORIF TIBIA PLATEAU Left 01/15/2018   Procedure: OPEN REDUCTION INTERNAL FIXATION (ORIF) TIBIAL PLATEAU;  Surgeon: Altamese [place], MD;  Location: [place];  Service: Orthopedics;  Laterality: Left;   REVISON OF ARTERIOVENOUS FISTULA Left 05/26/2022   Procedure: REVISION OF LEFT ARM ARTERIOVENOUS FISTULA WITH PLICATION OF PSEUDOANEURYSM;  Surgeon: Waynetta Sandy, MD;  Location: [place];  Service: Vascular;  Laterality: Left;   SCLEROTHERAPY  11/05/2021   Procedure: Clide Deutscher;  Surgeon: Carol Ada, MD;  Location: [place];  Service: Endoscopy;;   SCLEROTHERAPY  11/16/2021   Procedure: Clide Deutscher;  Surgeon: [name], MD;  Location: [facility] ENDOSCOPY;  Service: Gastroenterology;;   TRANSTHORACIC ECHOCARDIOGRAM  07/2016    EF 60-65%, No RWMA. Mod Concentric LVH - Gr 2 DD. Severe LA  dilation. PAP ~35 mmHg (mild Pulm HTN)  --> no changes noted 1 month later    Allergies  No Known Allergies  History of Present Illness    Jhace Radhakrishnan is a PMH of HTN, pericardial effusion without cardiac tamponade, combined systolic and diastolic CHF, paroxysmal atrial fibrillation, asthma, upper GI bleed, ESRD on HD TTS, nonadherence to medical  treatment, colon cancer, chronic anemia, and anxiety and depression.  He presented to the emergency department on 10/03/2022 and was discharged on 10/05/2022.  He reported shortness of breath.  His history was limited due to being on BiPAP and his work of breathing.  He had been hospitalized 12 3 through 12 8 with acute respiratory failure after leaving HD.  He was noncompliant with fluid restriction.  During his evaluation he reported being dialyzed 2 days prior.  At that time he was noted to be 8.5 kg over his estimated dry weight.  He was diagnosed with fluid volume overload and acute hypoxic respiratory failure.  He was placed on BiPAP and admitted to the hospital.  He diuresed 6 L over 3 days.  He was discharged in stable condition.  He presents to the clinic today for follow-up evaluation states***  *** denies chest pain, shortness of breath, lower extremity edema, fatigue, palpitations, melena, hematuria, hemoptysis, diaphoresis, weakness, presyncope, syncope, orthopnea, and PND.  Chronic combined systolic and diastolic CHF-compliant with hemodialysis Tuesday Thursday Saturday.  Weight stable.  Generalized bilateral lower extremity edema.  Echocardiogram 09/18/2022 showed a reduced EF of 40-45%. Continue carvedilol, hydralazine, Imdur Heart healthy low-sodium diet-salty 6 given Increase physical activity as tolerated Daily weights-contact office with a weight increase of 2-3 pounds overnight or 5 pounds in 1 week  Essential hypertension-BP today*** Continue hydralazine, carvedilol, Imdur Heart healthy low-sodium diet-salty 6 given Increase physical activity as tolerated  Elevated troponins-continues to deny chest discomfort.  Noted to have elevated high-sensitivity troponins at 192 and on recheck 242.  He denied chest pain at that time.  His EKG did not show significant changes and did not show ischemia.  It was felt that his troponins were elevated in the setting of hypertensive urgency  secondary to ESRD and fluid volume overload. No plans for ischemic evaluation.  Aortic dilation-denies recent episodes of chest and back pain.  Echocardiogram showed moderate dilation of his ascending aorta measuring 43 mm. Maintain good blood pressure control Repeat echocardiogram 12/24  Disposition: Follow-up with Dr. Ellyn Hack or me in 4 months.  Home Medications    Prior to Admission medications   Medication Sig Start Date End Date Taking? Authorizing Provider  albuterol (VENTOLIN HFA) 108 (90 Base) MCG/ACT inhaler Inhale 2 puffs into the lungs every 6 (six) hours as needed for wheezing or shortness of breath. 01/06/22   [provider]  aspirin 81 MG chewable tablet Chew 1 tablet (81 mg total) by mouth daily. 10/06/22   Thurnell Lose, MD  carvedilol (COREG) 6.25 MG tablet Take 1 tablet (6.25 mg total) by mouth 2 (two) times daily with a meal. 09/22/22 10/22/22  Patrecia Pour, MD  cetirizine (ZYRTEC) 10 MG tablet Take 10 mg by mouth daily. 09/05/22   [provider]  DULoxetine (CYMBALTA) 20 MG capsule Take 20 mg by mouth daily. 01/06/22   [provider]  fluticasone (FLONASE) 50 MCG/ACT nasal spray Place 1 spray into both nostrils 2 (two) times daily. 09/07/22   [provider]  hydrALAZINE (APRESOLINE) 50 MG tablet Take 1 tablet (50 mg total) by mouth 3 (  three) times daily. 10/05/22 11/04/22  Thurnell Lose, MD  isosorbide mononitrate (IMDUR) 30 MG 24 hr tablet Take 1 tablet (30 mg total) by mouth daily. 10/05/22 11/04/22  Thurnell Lose, MD  lidocaine (LMX) 4 % cream Apply 1 Application topically 3 (three) times a week. 06/27/22   [provider]  LIDOCAINE-PRILOCAINE EX Apply 1 application  topically Every Tuesday,Thursday,and Saturday with dialysis.    [provider]  montelukast (SINGULAIR) 10 MG tablet Take 10 mg by mouth at bedtime. 08/24/22   [provider]  oxyCODONE (OXY IR/ROXICODONE) 5 MG immediate release  tablet Take 5 mg by mouth 3 (three) times daily as needed for moderate pain or severe pain. 08/09/22   [provider]  pantoprazole (PROTONIX) 40 MG tablet Take 1 tablet (40 mg total) by mouth 2 (two) times daily before a meal. 08/14/22   Lavina Hamman, MD  rosuvastatin (CRESTOR) 10 MG tablet Take 1 tablet (10 mg total) by mouth daily. 10/06/22   Thurnell Lose, MD  sevelamer carbonate (RENVELA) 800 MG tablet Take 1,600 mg by mouth 4 (four) times daily. 09/15/22   [provider]  SYMBICORT 160-4.5 MCG/ACT inhaler SMARTSIG:2 Puff(s) By Mouth 09/22/22   [provider]    Family History    Family History  Problem Relation Age of Onset   Heart failure Mother        Died at age 66.   Heart attack Mother 55   Hypertension Mother    Diabetes Mellitus II Mother    Other Father        Unknown   Kidney failure Sister        (Oldest Sister)   Other Other        Multiple siblings have started her heart disease, he is not sure of the details.   CAD Nephew    He indicated that his mother is deceased. He indicated that his father is deceased. He indicated that all of his six sisters are alive. He indicated that all of his six brothers are alive. He indicated that his other is alive. He indicated that his nephew is alive.  Social History    Social History   Socioeconomic History   Marital status: Married    Spouse name: Not on file   Number of children: Not on file   Years of education: Not on file   Highest education level: Not on file  Occupational History   Occupation: disabled  Tobacco Use   Smoking status: Former    Packs/day: 0.25    Types: Cigarettes    Quit date: 05/12/2022    Years since quitting: 0.4    Passive exposure: Never   Smokeless tobacco: Never  Vaping Use   Vaping Use: Never used  Substance and Sexual Activity   Alcohol use: No    Alcohol/week: 7.0 standard drinks of alcohol    Types: 7 Cans of beer per week   Drug use: Yes     Frequency: 1.0 times per week    Types: Marijuana, Heroin    Comment: last time 05/25/22   Sexual activity: Yes    Comment: once a week  Other Topics Concern   Not on file  Social History Narrative   Not on file   Social Determinants of Health   Financial Resource Strain: Not on file  Food Insecurity: No Food Insecurity (09/21/2022)   Hunger Vital Sign    Worried About Running Out of Food in the  Last Year: Never true    [place] in the Last Year: Never true  Transportation Needs: No Transportation Needs (09/21/2022)   PRAPARE - Hydrologist (Medical): No    Lack of Transportation (Non-Medical): No  Physical Activity: Not on file  Stress: Not on file  Social Connections: Not on file  Intimate Partner Violence: Not At Risk (09/21/2022)   Humiliation, Afraid, Rape, and Kick questionnaire    Fear of Current or Ex-Partner: No    Emotionally Abused: No    Physically Abused: No    Sexually Abused: No     Review of Systems    General:  No chills, fever, night sweats or weight changes.  Cardiovascular:  No chest pain, dyspnea on exertion, edema, orthopnea, palpitations, paroxysmal nocturnal dyspnea. Dermatological: No rash, lesions/masses Respiratory: No cough, dyspnea Urologic: No hematuria, dysuria Abdominal:   No nausea, vomiting, diarrhea, bright red blood per rectum, melena, or hematemesis Neurologic:  No visual changes, wkns, changes in mental status. All other systems reviewed and are otherwise negative except as noted above.  Physical Exam    VS:  There were no vitals taken for this visit. , BMI There is no height or weight on file to calculate BMI. GEN: Well nourished, well developed, in no acute distress. HEENT: normal. Neck: Supple, no JVD, carotid bruits, or masses. Cardiac: RRR, no murmurs, rubs, or gallops. No clubbing, cyanosis, edema.  Radials/DP/PT 2+ and equal bilaterally.  Respiratory:  Respirations regular and unlabored,  clear to auscultation bilaterally. GI: Soft, nontender, nondistended, BS + x 4. MS: no deformity or atrophy. Skin: warm and dry, no rash. Neuro:  Strength and sensation are intact. Psych: Normal affect.  Accessory Clinical Findings    Recent Labs: 11/19/2021: TSH 3.168 10/02/2022: ALT 24 10/05/2022: B Natriuretic Peptide 4,280.1; BUN 34; Creatinine, Ser 5.08; Hemoglobin 10.2; Magnesium 2.0; Platelets 153; Potassium 3.6; Sodium 133   Recent Lipid Panel    Component Value Date/Time   CHOL 242 (H) 07/29/2015 0943   TRIG 125 09/19/2022 0600   HDL 51 07/29/2015 0943   CHOLHDL 4.7 07/29/2015 0943   VLDL 34 (H) 07/29/2015 0943   LDLCALC 157 (H) 07/29/2015 0943    No BP recorded.  {Refresh Note OR Click here to enter BP  :1}***    ECG personally reviewed by me today- *** - No acute changes  Echocardiogram 09/18/2022  IMPRESSIONS     1. Left ventricular ejection fraction, by estimation, is 40 to 45%. The  left ventricle has mildly decreased function. The left ventricular  internal cavity size was moderately dilated. There is severe concentric  left ventricular hypertrophy.   2. Right ventricular systolic function is moderately reduced. The right  ventricular size is mildly enlarged. Mildly increased right ventricular  wall thickness.   3. Left atrial size was mildly dilated.   4. Right atrial size was mildly dilated.   5. Mild mitral valve regurgitation.   6. AV is thickened, calcified Appears functionally bicuspid. Peak and  mean gradients through the valve are 7 and 4 mm Hg respectively. AVA (VTI)  is 3.22 cm2. DVI is 0.85 consistent with mild AS.Marland Kitchen The aortic valve is  bicuspid. Aortic valve regurgitation  is mild.   7. Aortic dilatation noted. There is moderate dilatation of the ascending  aorta, measuring 43 mm.   8. The inferior vena cava is normal in size with greater than 50%  respiratory variability, suggesting right atrial pressure of 3 mmHg.  FINDINGS   Left  Ventricle: Left ventricular ejection fraction, by estimation, is 40  to 45%. The left ventricle has mildly decreased function. The left  ventricular internal cavity size was moderately dilated. There is severe  concentric left ventricular hypertrophy.   Right Ventricle: The right ventricular size is mildly enlarged. Mildly  increased right ventricular wall thickness. Right ventricular systolic  function is moderately reduced.   Left Atrium: Left atrial size was mildly dilated.   Right Atrium: Right atrial size was mildly dilated.   Pericardium: There is no evidence of pericardial effusion.   Mitral Valve: There is mild thickening of the mitral valve leaflet(s).  Mild to moderate mitral annular calcification. Mild mitral valve  regurgitation.   Tricuspid Valve: The tricuspid valve is normal in structure. Tricuspid  valve regurgitation is mild.   Aortic Valve: AV is thickened, calcified Appears functionally bicuspid.  Peak and mean gradients through the valve are 7 and 4 mm Hg respectively.  AVA (VTI) is 3.22 cm2. DVI is 0.85 consistent with mild AS. The aortic  valve is bicuspid. Aortic valve  regurgitation is mild. Aortic valve mean gradient measures 4.0 mmHg.  Aortic valve peak gradient measures 7.2 mmHg. Aortic valve area, by VTI  measures 3.22 cm.   Pulmonic Valve: The pulmonic valve was not well visualized. Pulmonic valve  regurgitation is not visualized. No evidence of pulmonic stenosis.   Aorta: Aortic dilatation noted. There is moderate dilatation of the  ascending aorta, measuring 43 mm.   Venous: The inferior vena cava is normal in size with greater than 50%  respiratory variability, suggesting right atrial pressure of 3 mmHg.   IAS/Shunts: No atrial level shunt detected by color flow Doppler.    Assessment & Plan   1.  ***   Jossie Ng. Miah Boye NP-C     10/23/2022, 7:08 AM [place] [address] Office [phone]  Fax [phone]    I spent***minutes examining this patient, reviewing medications, and using patient centered shared decision making involving her cardiac care.  Prior to her visit I spent greater than 20 minutes reviewing her past medical history,  medications, and prior cardiac tests.

## 2022-10-25 ENCOUNTER — Ambulatory Visit: Payer: 59 | Attending: General Practice | Admitting: General Practice

## 2022-10-25 ENCOUNTER — Other Ambulatory Visit (HOSPITAL_COMMUNITY): Payer: Self-pay

## 2022-10-25 ENCOUNTER — Inpatient Hospital Stay: Payer: Self-pay | Admitting: Nurse Practitioner

## 2022-10-27 ENCOUNTER — Encounter (HOSPITAL_COMMUNITY): Payer: Self-pay | Admitting: Vascular Surgery

## 2022-10-27 ENCOUNTER — Ambulatory Visit (HOSPITAL_COMMUNITY)
Admission: RE | Admit: 2022-10-27 | Discharge: 2022-10-27 | Disposition: A | Discharge status: Home / Self Care | Payer: 59 | Attending: Vascular Surgery | Admitting: Vascular Surgery

## 2022-10-27 ENCOUNTER — Other Ambulatory Visit: Payer: Self-pay

## 2022-10-27 ENCOUNTER — Ambulatory Visit (HOSPITAL_COMMUNITY)
Admission: RE | Disposition: A | Discharge status: Home / Self Care | Payer: Self-pay | Source: Home / Self Care | Attending: Vascular Surgery

## 2022-10-27 DIAGNOSIS — T82858A Stenosis of vascular prosthetic devices, implants and grafts, initial encounter: Secondary | ICD-10-CM | POA: Insufficient documentation

## 2022-10-27 DIAGNOSIS — Z87891 Personal history of nicotine dependence: Secondary | ICD-10-CM | POA: Diagnosis not present

## 2022-10-27 DIAGNOSIS — N186 End stage renal disease: Secondary | ICD-10-CM | POA: Insufficient documentation

## 2022-10-27 DIAGNOSIS — Z992 Dependence on renal dialysis: Secondary | ICD-10-CM | POA: Insufficient documentation

## 2022-10-27 DIAGNOSIS — Y841 Kidney dialysis as the cause of abnormal reaction of the patient, or of later complication, without mention of misadventure at the time of the procedure: Secondary | ICD-10-CM | POA: Diagnosis not present

## 2022-10-27 DIAGNOSIS — T82898A Other specified complication of vascular prosthetic devices, implants and grafts, initial encounter: Secondary | ICD-10-CM | POA: Diagnosis not present

## 2022-10-27 DIAGNOSIS — N185 Chronic kidney disease, stage 5: Secondary | ICD-10-CM | POA: Diagnosis not present

## 2022-10-27 DIAGNOSIS — I132 Hypertensive heart and chronic kidney disease with heart failure and with stage 5 chronic kidney disease, or end stage renal disease: Secondary | ICD-10-CM | POA: Insufficient documentation

## 2022-10-27 DIAGNOSIS — I5032 Chronic diastolic (congestive) heart failure: Secondary | ICD-10-CM | POA: Diagnosis not present

## 2022-10-27 HISTORY — PX: PERIPHERAL VASCULAR BALLOON ANGIOPLASTY: CATH118281

## 2022-10-27 HISTORY — PX: A/V FISTULAGRAM: CATH118298

## 2022-10-27 LAB — POCT I-STAT, CHEM 8
BUN: 37 mg/dL — ABNORMAL HIGH (ref 8–23)
Calcium, Ion: 0.98 mmol/L — ABNORMAL LOW (ref 1.15–1.40)
Chloride: 94 mmol/L — ABNORMAL LOW (ref 98–111)
Creatinine, Ser: 6 mg/dL — ABNORMAL HIGH (ref 0.61–1.24)
Glucose, Bld: 99 mg/dL (ref 70–99)
HCT: 40 % (ref 39.0–52.0)
Hemoglobin: 13.6 g/dL (ref 13.0–17.0)
Potassium: 3.7 mmol/L (ref 3.5–5.1)
Sodium: 136 mmol/L (ref 135–145)
TCO2: 30 mmol/L (ref 22–32)

## 2022-10-27 SURGERY — A/V FISTULAGRAM
Anesthesia: LOCAL | Laterality: Left

## 2022-10-27 MED ORDER — IODIXANOL 320 MG/ML IV SOLN
INTRAVENOUS | Status: DC | PRN
  Administered 2022-10-27: 65 mL

## 2022-10-27 MED ORDER — HEPARIN (PORCINE) IN NACL 1000-0.9 UT/500ML-% IV SOLN
INTRAVENOUS | Status: DC | PRN
  Administered 2022-10-27: 500 mL

## 2022-10-27 MED ORDER — LIDOCAINE HCL (PF) 1 % IJ SOLN
INTRAMUSCULAR | Status: AC
  Filled 2022-10-27: qty 30

## 2022-10-27 MED ORDER — HEPARIN (PORCINE) IN NACL 1000-0.9 UT/500ML-% IV SOLN
INTRAVENOUS | Status: AC
  Filled 2022-10-27: qty 1000

## 2022-10-27 MED ORDER — SODIUM CHLORIDE 0.9% FLUSH: 3.0000 mL | INTRAVENOUS | Status: DC | PRN

## 2022-10-27 MED ORDER — LIDOCAINE HCL (PF) 1 % IJ SOLN
INTRAMUSCULAR | Status: DC | PRN
  Administered 2022-10-27: 3 mL

## 2022-10-27 SURGICAL SUPPLY — 15 items
BALLN MUSTANG 7.0X20 75 (BALLOONS) ×2
BALLN MUSTANG 9X40X75 (BALLOONS) ×2
BALLOON MUSTANG 7.0X20 75 (BALLOONS) IMPLANT
BALLOON MUSTANG 9X40X75 (BALLOONS) IMPLANT
COVER DOME SNAP 22 D (MISCELLANEOUS) ×2 IMPLANT
KIT ENCORE 26 ADVANTAGE (KITS) IMPLANT
KIT MICROPUNCTURE NIT STIFF (SHEATH) IMPLANT
PROTECTION STATION PRESSURIZED (MISCELLANEOUS) ×2
SHEATH PINNACLE R/O II 7F 4CM (SHEATH) IMPLANT
SHEATH PROBE COVER 6X72 (BAG) ×2 IMPLANT
STATION PROTECTION PRESSURIZED (MISCELLANEOUS) ×2 IMPLANT
STOPCOCK MORSE 400PSI 3WAY (MISCELLANEOUS) ×2 IMPLANT
TRAY PV CATH (CUSTOM PROCEDURE TRAY) ×2 IMPLANT
TUBING CIL FLEX 10 FLL-RA (TUBING) ×2 IMPLANT
WIRE BENTSON .035X145CM (WIRE) IMPLANT

## 2022-10-27 NOTE — Op Note (Signed)
DATE OF SERVICE: 10/27/2022  PATIENT:  Timothy [name]  62 y.o. male  PRE-OPERATIVE DIAGNOSIS: End-stage renal disease, malfunctioning left arm fistula  POST-OPERATIVE DIAGNOSIS:  Same  PROCEDURE:   1) left arm fistulogram 2) angioplasty of left arm arteriovenous fistula (9 x 40 mm [place])  SURGEON:  Surgeon(s) and Role:    * Mairyn Lenahan, [name], MD - Primary  ASSISTANT: none  ANESTHESIA:   local  EBL: Minimal  BLOOD ADMINISTERED:none  DRAINS: none   LOCAL MEDICATIONS USED:  LIDOCAINE   SPECIMEN: None  COUNTS: confirmed correct.  TOURNIQUET:  none  PATIENT DISPOSITION:  PACU - hemodynamically stable.   Delay start of Pharmacological VTE agent (>24hrs) due to surgical blood loss or risk of bleeding: no  INDICATION FOR PROCEDURE: Timothy [name] is a 62 y.o. male with end-stage renal disease dialyzing through a left arm brachiocephalic arteriovenous fistula.  Dr. Osborne Casco did call their office asking for fistulogram because of high arterial pressures and poor urea reduction ratio. After careful discussion of risks, benefits, and alternatives the patient was offered fistulogram. The patient understood and wished to proceed.  OPERATIVE FINDINGS: Significant proximal stenosis and a tortuous, but widely patent brachiocephalic arteriovenous fistula.  This was improved significantly with angioplasty.  DESCRIPTION OF PROCEDURE: After identification of the patient in the pre-operative holding area, the patient was transferred to the operating room. The patient was positioned supine on the operating room table. Anesthesia was induced. The left arm was prepped and draped in standard fashion. A surgical pause was performed confirming correct patient, procedure, and operative location.  The area over the peripheral fistula was infiltrated with 1% lidocaine.  Puncture access was obtained in the left upper extremity fistula.  Fistulogram was performed in stations.  There is abundant  collateralization about the cephalic arch, but repeat imaging showed no obvious stenosis there.  The fistula in the arm was highly tortuous but widely patent with the exception of 1 area near the arterial anastomosis which showed a 60 to 70% stenosis.  This was treated with plain angioplasty.  Good technical result was achieved.  The patient tolerated this well.  All endovascular equipment was removed.  A pursestring suture was applied to the access site.  Hemostasis was achieved.  Upon completion of the case instrument and sharps counts were confirmed correct. The patient was transferred to the PACU in good condition. I was present for all portions of the procedure.  [name]. Stanford Breed, MD [facility] Phone Number: [phone] 10/27/2022 9:19 AM

## 2022-10-27 NOTE — H&P (Signed)
[facility] [place]  ASSESSMENT / PLAN: 62 y.o. male with malfunctioning left arm arteriovenous fistula. Plan fistulagram today.  CHIEF COMPLAINT: poorly functioning fistula  HISTORY OF PRESENT ILLNESS: Timothy [name] is a 62 y.o. male added to the angiogram scheduled today for fistulogram.  The patient reports his fistula "swells up" during dialysis.  Per referral paperwork from Dr. Osborne Casco, the patient has a high arterial pressure during dialysis sessions and has a low urea reduction ratio. Past Medical History:  Diagnosis Date   Acute metabolic encephalopathy 24/58/0998   Anemia of chronic kidney failure    BPH (benign prostatic hyperplasia)    Colon cancer [facility]) 2014   spouse states he had surgury for colon CA   End stage renal disease on dialysis [facility]) 2017   TTHSat   Hepatitis C    treated   Hypertension    Hypertensive crisis 04/07/2022   Hypertensive heart disease with chronic diastolic congestive heart failure ([place]) 07/17/2016   Polysubstance abuse ([place])    History of heroin and marijuana use   Thoracic aortic aneurysm ([place])    followed by Dr. Ellyn Hack    Past Surgical History:  Procedure Laterality Date   A/V FISTULAGRAM Left 12/02/2021   Procedure: A/V Fistulagram;  Surgeon: Cherre Robins, MD;  Location: [place] [facility];  Service: Cardiovascular;  Laterality: Left;   A/V FISTULAGRAM Left 04/24/2022   Procedure: A/V Fistulagram;  Surgeon: Waynetta Sandy, MD;  Location: [place] [facility];  Service: Cardiovascular;  Laterality: Left;   ABDOMINAL SURGERY     AV FISTULA PLACEMENT Left 09/19/2016   Procedure: Left arm Radiocephalic ARTERIOVENOUS (AV) FISTULA CREATION;  Surgeon: Conrad Marland, MD;  Location: [place];  Service: Vascular;  Laterality: Left;   BASCILIC VEIN TRANSPOSITION Left 07/09/2017   Procedure: BRACHIOCEPHALIC FISTULA CREATION;  Surgeon: Conrad Breesport, MD;  Location: [place];  Service: Vascular;  Laterality: Left;    BIOPSY  07/05/2022   Procedure: BIOPSY;  Surgeon: Thornton Park, MD;  Location: [place];  Service: Gastroenterology;;   COLON SURGERY  2014   COLONOSCOPY N/A 07/12/2022   Procedure: COLONOSCOPY;  Surgeon: Doran Stabler, MD;  Location: [place];  Service: Gastroenterology;  Laterality: N/A;   COLONOSCOPY WITH PROPOFOL N/A 07/05/2022   Procedure: COLONOSCOPY WITH PROPOFOL;  Surgeon: Thornton Park, MD;  Location: [place];  Service: Gastroenterology;  Laterality: N/A;   ENTEROSCOPY N/A 07/28/2022   Procedure: ENTEROSCOPY;  Surgeon: [name], MD;  Location: [facility] ENDOSCOPY;  Service: Gastroenterology;  Laterality: N/A;   ESOPHAGOGASTRODUODENOSCOPY (EGD) WITH PROPOFOL N/A 11/05/2021   Procedure: ESOPHAGOGASTRODUODENOSCOPY (EGD) WITH PROPOFOL;  Surgeon: Carol Ada, MD;  Location: [place];  Service: Endoscopy;  Laterality: N/A;   ESOPHAGOGASTRODUODENOSCOPY (EGD) WITH PROPOFOL N/A 11/14/2021   Procedure: ESOPHAGOGASTRODUODENOSCOPY (EGD) WITH PROPOFOL;  Surgeon: [name], MD;  Location: [place];  Service: Gastroenterology;  Laterality: N/A;   ESOPHAGOGASTRODUODENOSCOPY (EGD) WITH PROPOFOL N/A 11/16/2021   Procedure: ESOPHAGOGASTRODUODENOSCOPY (EGD) WITH PROPOFOL;  Surgeon: [name], MD;  Location: [place];  Service: Gastroenterology;  Laterality: N/A;   ESOPHAGOGASTRODUODENOSCOPY (EGD) WITH PROPOFOL N/A 07/05/2022   Procedure: ESOPHAGOGASTRODUODENOSCOPY (EGD) WITH PROPOFOL;  Surgeon: Thornton Park, MD;  Location: [place];  Service: Gastroenterology;  Laterality: N/A;   ESOPHAGOGASTRODUODENOSCOPY (EGD) WITH PROPOFOL N/A 07/12/2022   Procedure: ESOPHAGOGASTRODUODENOSCOPY (EGD) WITH PROPOFOL;  Surgeon: Doran Stabler, MD;  Location: [place];  Service: Gastroenterology;  Laterality: N/A;   ESOPHAGOGASTRODUODENOSCOPY (EGD) WITH PROPOFOL N/A 07/31/2022  Procedure: ESOPHAGOGASTRODUODENOSCOPY (EGD) WITH PROPOFOL;  Surgeon: Ladene Artist, MD;   Location: [place];  Service: Gastroenterology;  Laterality: N/A;   ESOPHAGOGASTRODUODENOSCOPY (EGD) WITH PROPOFOL N/A 08/13/2022   Procedure: ESOPHAGOGASTRODUODENOSCOPY (EGD) WITH PROPOFOL;  Surgeon: Gatha Mayer, MD;  Location: [place];  Service: Gastroenterology;  Laterality: N/A;   HEMOSTASIS CLIP PLACEMENT  11/16/2021   Procedure: HEMOSTASIS CLIP PLACEMENT;  Surgeon: [name], MD;  Location: [facility] ENDOSCOPY;  Service: Gastroenterology;;   HEMOSTASIS CLIP PLACEMENT  07/05/2022   Procedure: HEMOSTASIS CLIP PLACEMENT;  Surgeon: Thornton Park, MD;  Location: [facility] ENDOSCOPY;  Service: Gastroenterology;;   HEMOSTASIS CLIP PLACEMENT  08/13/2022   Procedure: HEMOSTASIS CLIP PLACEMENT;  Surgeon: Gatha Mayer, MD;  Location: [facility] ENDOSCOPY;  Service: Gastroenterology;;   HEMOSTASIS CONTROL  11/05/2021   Procedure: HEMOSTASIS CONTROL;  Surgeon: Carol Ada, MD;  Location: [place];  Service: Endoscopy;;   HEMOSTASIS CONTROL  11/16/2021   Procedure: HEMOSTASIS CONTROL;  Surgeon: [name], MD;  Location: [place];  Service: Gastroenterology;;   HEMOSTASIS CONTROL  07/05/2022   Procedure: HEMOSTASIS CONTROL;  Surgeon: Thornton Park, MD;  Location: [place];  Service: Gastroenterology;;   HOT HEMOSTASIS N/A 11/16/2021   Procedure: HOT HEMOSTASIS (ARGON PLASMA COAGULATION/BICAP);  Surgeon: [name], MD;  Location: [place];  Service: Gastroenterology;  Laterality: N/A;   HOT HEMOSTASIS N/A 08/13/2022   Procedure: HOT HEMOSTASIS (ARGON PLASMA COAGULATION/BICAP);  Surgeon: Gatha Mayer, MD;  Location: [place];  Service: Gastroenterology;  Laterality: N/A;   INSERTION OF DIALYSIS CATHETER N/A 09/19/2016   Procedure: INSERTION OF TUNNELED DIALYSIS CATHETER;  Surgeon: Conrad [place], MD;  Location: [place];  Service: Vascular;  Laterality: N/A;   IR ANGIOGRAM SELECTIVE EACH ADDITIONAL VESSEL  11/16/2021   IR ANGIOGRAM VISCERAL SELECTIVE  11/05/2021   IR ANGIOGRAM  VISCERAL SELECTIVE  11/16/2021   IR ANGIOGRAM VISCERAL SELECTIVE  11/16/2021   IR EMBO ART  VEN HEMORR LYMPH EXTRAV  INC GUIDE ROADMAPPING  11/05/2021   IR EMBO ART  VEN HEMORR LYMPH EXTRAV  INC GUIDE ROADMAPPING  11/16/2021   IR GENERIC HISTORICAL  09/14/2016   IR US GUIDE VASC ACCESS RIGHT 09/14/2016 [name], DO [facility]   IR GENERIC HISTORICAL  09/14/2016   IR FLUORO GUIDE CV LINE RIGHT 09/14/2016 [name], DO [facility]   IR PARACENTESIS  06/22/2021   IR US GUIDE [place] RIGHT  11/05/2021   IR US GUIDE [place] RIGHT  11/16/2021   LAPAROTOMY N/A 05/23/2021   Procedure: EXPLORATORY LAPAROTOMY LYSIS ADHESIONS;  Surgeon: [name], MD;  Location: [place];  Service: General;  Laterality: N/A;   LAPAROTOMY N/A 11/20/2021   Procedure: EXPLORATORY LAPAROTOMY, REPAIR OF BLEEDING DUODENAL ULCER;  Surgeon: Dwan Bolt, MD;  Location: [place];  Service: General;  Laterality: N/A;   ORIF TIBIA PLATEAU Left 01/15/2018   Procedure: OPEN REDUCTION INTERNAL FIXATION (ORIF) TIBIAL PLATEAU;  Surgeon: Altamese Smith[place]erry, MD;  Location: [place];  Service: Orthopedics;  Laterality: Left;   REVISON OF ARTERIOVENOUS FISTULA Left 05/26/2022   Procedure: REVISION OF LEFT ARM ARTERIOVENOUS FISTULA WITH PLICATION OF PSEUDOANEURYSM;  Surgeon: Waynetta Sandy, MD;  Location: [place];  Service: Vascular;  Laterality: Left;   SCLEROTHERAPY  11/05/2021   Procedure: Clide Deutscher;  Surgeon: Carol Ada, MD;  Location: [place];  Service: Endoscopy;;   SCLEROTHERAPY  11/16/2021   Procedure: Clide Deutscher;  Surgeon: [name], MD;  Location: [place];  Service: Gastroenterology;;   Domingo Dimes  ECHOCARDIOGRAM  07/2016    EF 60-65%, No RWMA. Mod Concentric LVH - Gr 2 DD. Severe LA dilation. PAP ~35 mmHg (mild Pulm HTN)  --> no changes noted 1 month later    Family History  Problem Relation Age of Onset   Heart failure Mother        Died at age 79.   Heart attack Mother 66   Hypertension  Mother    Diabetes Mellitus II Mother    Other Father        Unknown   Kidney failure Sister        (Oldest Sister)   Other Other        Multiple siblings have started her heart disease, he is not sure of the details.   CAD Nephew     Social History   Socioeconomic History   Marital status: Married    Spouse name: Not on file   Number of children: Not on file   Years of education: Not on file   Highest education level: Not on file  Occupational History   Occupation: disabled  Tobacco Use   Smoking status: Former    Packs/day: 0.25    Types: Cigarettes    Quit date: 05/12/2022    Years since quitting: 0.4    Passive exposure: Never   Smokeless tobacco: Never  Vaping Use   Vaping Use: Never used  Substance and Sexual Activity   Alcohol use: No    Alcohol/week: 7.0 standard drinks of alcohol    Types: 7 Cans of beer per week   Drug use: Yes    Frequency: 1.0 times per week    Types: Marijuana, Heroin    Comment: last time 05/25/22   Sexual activity: Yes    Comment: once a week  Other Topics Concern   Not on file  Social History Narrative   Not on file   Social Determinants of Health   Financial Resource Strain: Not on file  Food Insecurity: No Food Insecurity (09/21/2022)   Hunger Vital Sign    Worried About Running Out of Food in the Last Year: Never true    [place] in the Last Year: Never true  Transportation Needs: No Transportation Needs (09/21/2022)   PRAPARE - Hydrologist (Medical): No    Lack of Transportation (Non-Medical): No  Physical Activity: Not on file  Stress: Not on file  Social Connections: Not on file  Intimate Partner Violence: Not At Risk (09/21/2022)   Humiliation, Afraid, Rape, and Kick questionnaire    Fear of Current or Ex-Partner: No    Emotionally Abused: No    Physically Abused: No    Sexually Abused: No    No Known Allergies  Current Facility-Administered Medications  Medication Dose Route  Frequency Provider Last Rate Last Admin   Heparin (Porcine) in NaCl 1000-0.9 UT/500ML-% SOLN    PRN Cherre Robins, MD   500 mL at 10/27/22 0852   iodixanol (VISIPAQUE) 320 MG/ML injection    PRN Cherre Robins, MD   65 mL at 10/27/22 0921   lidocaine (PF) (XYLOCAINE) 1 % injection    PRN Cherre Robins, MD   3 mL at 10/27/22 0859   sodium chloride flush (NS) 0.9 % injection 3 mL  3 mL Intravenous PRN Cherre Robins, MD        PHYSICAL EXAM Vitals:   10/27/22 2025 10/27/22 0915 10/27/22 0920 10/27/22 0931  BP: 130/88 Marland Kitchen)  139/92 126/74 (!) 133/90  Pulse: (!) 52 (!) 59 (!) 54 (!) 59  Resp:      Temp:      TempSrc:      SpO2: 92% 90% 93% 92%  Weight:      Height:       Chronically ill man in no acute distress Regular rate and rhythm Unlabored breathing Left upper extremity arteriovenous fistula appears to be brachiocephalic fistula.  The fistula is pulsatile.   PERTINENT LABORATORY AND RADIOLOGIC DATA  Most recent CBC    Latest Ref Rng & Units 10/27/2022    7:23 AM 10/05/2022    4:11 PM 10/05/2022    1:07 AM  CBC  WBC 4.0 - 10.5 K/uL  3.5  4.6   Hemoglobin 13.0 - 17.0 g/dL 13.6  10.2  10.4   Hematocrit 39.0 - 52.0 % 40.0  31.0  30.9   Platelets 150 - 400 K/uL  153  150      Most recent CMP    Latest Ref Rng & Units 10/27/2022    7:23 AM 10/05/2022    4:10 PM 10/04/2022    3:11 PM  CMP  Glucose 70 - 99 mg/dL 99  105  96   BUN 8 - 23 mg/dL 37  34  38   Creatinine 0.61 - 1.24 mg/dL 6.00  5.08  4.51   Sodium 135 - 145 mmol/L 136  133  134   Potassium 3.5 - 5.1 mmol/L 3.7  3.6  3.4   Chloride 98 - 111 mmol/L 94  93  94   CO2 22 - 32 mmol/L  28  27   Calcium 8.9 - 10.3 mg/dL  8.0  7.9     Renal function Estimated Creatinine Clearance: 14.1 mL/min (A) (by C-G formula based on SCr of 6 mg/dL (H)).  No results found for: "HGBA1C"  LDL Cholesterol  Date Value Ref Range Status  07/29/2015 157 (H) <130 mg/dL Final    Comment:      Total Cholesterol/HDL  Ratio:CHD Risk                        Coronary Heart Disease Risk Table                                        Men       Women          1/2 Average Risk              3.4        3.3              Average Risk              5.0        4.4           2X Average Risk              9.6        7.1           3X Average Risk             23.4       11.0 Use the calculated Patient Ratio above and the CHD Risk table  to determine the patient's CHD Risk.     [name]. Stanford Breed, MD FACS [facility]  Number: [phone] 10/27/2022 9:47 AM   Total time spent on preparing this encounter including chart review, data review, collecting history, examining the patient, coordinating care for this established patient, 40 minutes.  Portions of this report may have been transcribed using voice recognition software.  Every effort has been made to ensure accuracy; however, inadvertent computerized transcription errors may still be present.

## 2022-10-30 MED FILL — Lidocaine HCl Local Preservative Free (PF) Inj 1%: INTRAMUSCULAR | Qty: 30 | Status: AC

## 2022-11-01 ENCOUNTER — Inpatient Hospital Stay: Payer: Self-pay | Admitting: Nurse Practitioner

## 2023-01-15 DEATH — deceased
